# Patient Record
Sex: Male | Born: 1945
Health system: Southern US, Community
[De-identification: ages and names within clinical notes are randomized; demographics above are authoritative.]

## PROBLEM LIST (undated history)

## (undated) ENCOUNTER — Emergency Department (HOSPITAL_COMMUNITY): Payer: Medicare Other | Source: Home / Self Care

## (undated) DIAGNOSIS — Z950 Presence of cardiac pacemaker: Secondary | ICD-10-CM

## (undated) DIAGNOSIS — R9430 Abnormal result of cardiovascular function study, unspecified: Secondary | ICD-10-CM

## (undated) DIAGNOSIS — Z9981 Dependence on supplemental oxygen: Secondary | ICD-10-CM

## (undated) DIAGNOSIS — I2510 Atherosclerotic heart disease of native coronary artery without angina pectoris: Secondary | ICD-10-CM

## (undated) DIAGNOSIS — U071 COVID-19: Secondary | ICD-10-CM

## (undated) DIAGNOSIS — K859 Acute pancreatitis without necrosis or infection, unspecified: Secondary | ICD-10-CM

## (undated) DIAGNOSIS — I739 Peripheral vascular disease, unspecified: Secondary | ICD-10-CM

## (undated) DIAGNOSIS — R0600 Dyspnea, unspecified: Secondary | ICD-10-CM

## (undated) DIAGNOSIS — I714 Abdominal aortic aneurysm, without rupture: Secondary | ICD-10-CM

## (undated) DIAGNOSIS — K219 Gastro-esophageal reflux disease without esophagitis: Secondary | ICD-10-CM

## (undated) DIAGNOSIS — N189 Chronic kidney disease, unspecified: Secondary | ICD-10-CM

## (undated) DIAGNOSIS — I447 Left bundle-branch block, unspecified: Secondary | ICD-10-CM

## (undated) DIAGNOSIS — E785 Hyperlipidemia, unspecified: Secondary | ICD-10-CM

## (undated) DIAGNOSIS — Z95810 Presence of automatic (implantable) cardiac defibrillator: Secondary | ICD-10-CM

## (undated) DIAGNOSIS — I10 Essential (primary) hypertension: Secondary | ICD-10-CM

## (undated) DIAGNOSIS — Z79899 Other long term (current) drug therapy: Secondary | ICD-10-CM

## (undated) DIAGNOSIS — T783XXA Angioneurotic edema, initial encounter: Secondary | ICD-10-CM

## (undated) DIAGNOSIS — F419 Anxiety disorder, unspecified: Secondary | ICD-10-CM

## (undated) DIAGNOSIS — I219 Acute myocardial infarction, unspecified: Secondary | ICD-10-CM

## (undated) DIAGNOSIS — I429 Cardiomyopathy, unspecified: Secondary | ICD-10-CM

## (undated) DIAGNOSIS — W009XXA Unspecified fall due to ice and snow, initial encounter: Secondary | ICD-10-CM

## (undated) DIAGNOSIS — J84112 Idiopathic pulmonary fibrosis: Secondary | ICD-10-CM

## (undated) DIAGNOSIS — K429 Umbilical hernia without obstruction or gangrene: Secondary | ICD-10-CM

## (undated) DIAGNOSIS — I4891 Unspecified atrial fibrillation: Secondary | ICD-10-CM

## (undated) HISTORY — DX: Acute pancreatitis without necrosis or infection, unspecified: K85.90

## (undated) HISTORY — DX: Unspecified fall due to ice and snow, initial encounter: W00.9XXA

## (undated) HISTORY — DX: Peripheral vascular disease, unspecified: I73.9

## (undated) HISTORY — PX: POLYPECTOMY: SHX149

## (undated) HISTORY — DX: Presence of cardiac pacemaker: Z95.0

## (undated) HISTORY — DX: Abdominal aortic aneurysm, without rupture: I71.4

## (undated) HISTORY — DX: Unspecified atrial fibrillation: I48.91

## (undated) HISTORY — DX: Acute myocardial infarction, unspecified: I21.9

## (undated) HISTORY — DX: Presence of automatic (implantable) cardiac defibrillator: Z95.810

## (undated) HISTORY — PX: CARDIAC CATHETERIZATION: SHX172

## (undated) HISTORY — DX: Cardiomyopathy, unspecified: I42.9

## (undated) HISTORY — PX: ABDOMINAL AORTIC ANEURYSM REPAIR: SUR1152

## (undated) HISTORY — PX: COLONOSCOPY: SHX174

## (undated) HISTORY — DX: Hyperlipidemia, unspecified: E78.5

## (undated) HISTORY — DX: Angioneurotic edema, initial encounter: T78.3XXA

## (undated) HISTORY — PX: TONSILLECTOMY: SUR1361

## (undated) HISTORY — DX: Other long term (current) drug therapy: Z79.899

## (undated) HISTORY — DX: Essential (primary) hypertension: I10

## (undated) HISTORY — DX: Abnormal result of cardiovascular function study, unspecified: R94.30

## (undated) HISTORY — DX: Atherosclerotic heart disease of native coronary artery without angina pectoris: I25.10

## (undated) HISTORY — DX: Left bundle-branch block, unspecified: I44.7

---

## 2007-11-30 ENCOUNTER — Encounter: Payer: Self-pay | Admitting: Family Medicine

## 2007-12-11 ENCOUNTER — Ambulatory Visit: Payer: Self-pay | Admitting: *Deleted

## 2007-12-31 ENCOUNTER — Encounter: Payer: Self-pay | Admitting: Family Medicine

## 2007-12-31 ENCOUNTER — Ambulatory Visit: Payer: Self-pay | Admitting: *Deleted

## 2008-03-24 ENCOUNTER — Ambulatory Visit: Payer: Self-pay | Admitting: *Deleted

## 2008-06-23 ENCOUNTER — Ambulatory Visit: Payer: Self-pay | Admitting: *Deleted

## 2008-07-04 ENCOUNTER — Encounter (INDEPENDENT_AMBULATORY_CARE_PROVIDER_SITE_OTHER): Payer: Self-pay | Admitting: *Deleted

## 2008-07-04 LAB — CONVERTED CEMR LAB: Hgb A1c MFr Bld: 7.6 %

## 2008-09-15 ENCOUNTER — Ambulatory Visit: Payer: Self-pay | Admitting: *Deleted

## 2008-10-07 ENCOUNTER — Encounter (INDEPENDENT_AMBULATORY_CARE_PROVIDER_SITE_OTHER): Payer: Self-pay | Admitting: *Deleted

## 2008-10-07 LAB — CONVERTED CEMR LAB
Creatinine, Ser: 23 mg/dL
Microalb, Ur: 1.7 mg/dL

## 2009-03-16 ENCOUNTER — Ambulatory Visit: Payer: Self-pay | Admitting: Vascular Surgery

## 2009-03-23 ENCOUNTER — Encounter: Payer: Self-pay | Admitting: Family Medicine

## 2009-03-23 ENCOUNTER — Ambulatory Visit: Payer: Self-pay | Admitting: Vascular Surgery

## 2009-05-06 DIAGNOSIS — I714 Abdominal aortic aneurysm, without rupture, unspecified: Secondary | ICD-10-CM | POA: Insufficient documentation

## 2009-05-06 HISTORY — DX: Abdominal aortic aneurysm, without rupture: I71.4

## 2009-05-06 HISTORY — DX: Abdominal aortic aneurysm, without rupture, unspecified: I71.40

## 2009-07-18 ENCOUNTER — Encounter (INDEPENDENT_AMBULATORY_CARE_PROVIDER_SITE_OTHER): Payer: Self-pay | Admitting: *Deleted

## 2009-07-18 LAB — CONVERTED CEMR LAB
Testosterone, Free: 6 ng/dL
Testosterone, total: 292 ng/mL

## 2009-07-31 ENCOUNTER — Encounter (INDEPENDENT_AMBULATORY_CARE_PROVIDER_SITE_OTHER): Payer: Self-pay | Admitting: *Deleted

## 2009-07-31 LAB — CONVERTED CEMR LAB: PSA: 0.78 ng/mL

## 2009-08-21 ENCOUNTER — Encounter: Payer: Self-pay | Admitting: Family Medicine

## 2009-08-21 LAB — HM COLONOSCOPY

## 2009-09-21 ENCOUNTER — Ambulatory Visit: Payer: Self-pay | Admitting: Vascular Surgery

## 2009-09-21 ENCOUNTER — Encounter: Payer: Self-pay | Admitting: Family Medicine

## 2009-11-16 ENCOUNTER — Ambulatory Visit: Payer: Self-pay | Admitting: Family Medicine

## 2009-11-16 DIAGNOSIS — E1169 Type 2 diabetes mellitus with other specified complication: Secondary | ICD-10-CM | POA: Insufficient documentation

## 2009-11-16 DIAGNOSIS — E1159 Type 2 diabetes mellitus with other circulatory complications: Secondary | ICD-10-CM | POA: Insufficient documentation

## 2009-11-20 ENCOUNTER — Encounter (INDEPENDENT_AMBULATORY_CARE_PROVIDER_SITE_OTHER): Payer: Self-pay | Admitting: *Deleted

## 2009-11-21 ENCOUNTER — Ambulatory Visit: Payer: Self-pay | Admitting: Family Medicine

## 2009-11-24 LAB — CONVERTED CEMR LAB
ALT: 16 units/L (ref 0–53)
AST: 14 units/L (ref 0–37)
Albumin: 4.1 g/dL (ref 3.5–5.2)
Alkaline Phosphatase: 51 units/L (ref 39–117)
BUN: 29 mg/dL — ABNORMAL HIGH (ref 6–23)
CO2: 29 meq/L (ref 19–32)
Calcium: 9.4 mg/dL (ref 8.4–10.5)
Chloride: 107 meq/L (ref 96–112)
Cholesterol: 128 mg/dL (ref 0–200)
Creatinine, Ser: 0.8 mg/dL (ref 0.4–1.5)
Direct LDL: 62.5 mg/dL
GFR calc non Af Amer: 102.26 mL/min (ref >60)
Glucose, Bld: 164 mg/dL — ABNORMAL HIGH (ref 70–99)
HDL: 33.9 mg/dL — ABNORMAL LOW (ref >39.00)
Hgb A1c MFr Bld: 7.5 % — ABNORMAL HIGH (ref 4.6–6.5)
Potassium: 5 meq/L (ref 3.5–5.1)
Sodium: 139 meq/L (ref 135–145)
Total Bilirubin: 0.5 mg/dL (ref 0.3–1.2)
Total CHOL/HDL Ratio: 4
Total Protein: 6.9 g/dL (ref 6.0–8.3)
Triglycerides: 219 mg/dL — ABNORMAL HIGH (ref 0.0–149.0)
VLDL: 43.8 mg/dL — ABNORMAL HIGH (ref 0.0–40.0)

## 2009-12-13 ENCOUNTER — Encounter (INDEPENDENT_AMBULATORY_CARE_PROVIDER_SITE_OTHER): Payer: Self-pay | Admitting: *Deleted

## 2010-02-23 ENCOUNTER — Ambulatory Visit: Payer: Self-pay | Admitting: Family Medicine

## 2010-02-23 ENCOUNTER — Encounter (INDEPENDENT_AMBULATORY_CARE_PROVIDER_SITE_OTHER): Payer: Self-pay | Admitting: *Deleted

## 2010-02-23 LAB — CONVERTED CEMR LAB: Hgb A1c MFr Bld: 7.1 % — ABNORMAL HIGH (ref 4.6–6.5)

## 2010-02-27 ENCOUNTER — Ambulatory Visit: Payer: Self-pay | Admitting: Family Medicine

## 2010-02-27 DIAGNOSIS — I10 Essential (primary) hypertension: Secondary | ICD-10-CM | POA: Insufficient documentation

## 2010-06-05 NOTE — Letter (Signed)
Summary: [facility]  [facility]   Imported By: Lanelle Bal 12/15/2009 08:24:55  _____________________________________________________________________  External Attachment:    Type:   Image     Comment:   External Document

## 2010-06-05 NOTE — Assessment & Plan Note (Signed)
Summary: Luke [name] FROM [facility]/DLO   Vital Signs:  Patient profile:   65 year old male Height:      75 inches Weight:      161.25 pounds BMI:     20.23 Temp:     98.1 degrees F oral Pulse rate:   80 / minute Pulse rhythm:   regular BP sitting:   156 / 90  (left arm) Cuff size:   large  Vitals Entered By: Delilah Shan CMA Braidan Ricciardi Dull) (November 16, 2009 3:47 PM) CC: Transfer from [facility]   History of Present Illness: Diabetes:  Using medications without difficulties:yes Hypoglycemic episodes:no Hyperglycemic episodes:no Feet problems:no eye exam within last year: appointment at end of July.  Low T. Off meds currently.  Requesting records today.   Colonoscopy done and was normal except for diverticulosis.   Elevated Cholesterol: Using medications without problems:yes Muscle aches: no Other complaints: no  Preventive Screening-Counseling & Management  Alcohol-Tobacco     Smoking Status: quit  Caffeine-Diet-Exercise     Does Patient Exercise: yes      Drug Use:  no.    Current Medications (verified): 1)  Acetaminophen 325 Mg  Tabs (Acetaminophen) .... Take 1 Tablet By Mouth Once A Day 2)  Simvastatin 20 Mg Tabs (Simvastatin) .... ? Dosage 3)  Lisinopril 5 Mg Tabs (Lisinopril) .... ? Dosage 4)  Metformin Hcl 500 Mg Tabs (Metformin Hcl) .... 2 By Mouth in The Am, 1.5 By Mouth At Bedtime  Past History:  Past Medical History: DM2 PAD AAA per vasc surgery, observation as of 2011 HLD HTN- white coat HTN- often elevated in office and controlled on outside checks H/o low testosterone  Social History: Married former Smoker Retired Drug use-no Regular exercise-yes Smoking Status:  quit Drug Use:  no Does Patient Exercise:  yes  Physical Exam  General:  GEN: nad, alert and oriented HEENT: mucous membranes moist NECK: supple w/o LA CV: rrr.  no murmur PULM: ctab, no inc wob ABD: soft, +bs, bruit over lower mid abdomen  EXT: no edema SKIN: no acute rash   Diabetes  Management Exam:    Foot Exam (with socks and/or shoes not present):       Sensory-Pinprick/Light touch:          Left medial foot (L-4): normal          Left dorsal foot (L-5): normal          Left lateral foot (S-1): normal          Right medial foot (L-4): normal          Right dorsal foot (L-5): normal          Right lateral foot (S-1): normal       Sensory-Monofilament:          Left foot: normal          Right foot: normal       Inspection:          Left foot: normal          Right foot: normal       Nails:          Left foot: thickened          Right foot: thickened   Impression & Recommendations:  Problem # 1:  DIABETES-TYPE 2 (ICD-250.00) Continue current meds. Patient to call back with lisinopril dose and simvastatin dose.  Will rx as that point.  Hard copy written for CMET/lipid/A1c.  We'll contact pt with lab  report.  Plan for follow up in 3 months to discuss DM2 and low T.  requesting records.  The following medications were removed from the medication list:    Metformin Hcl 1000 Mg Tabs (Metformin hcl) .Marland Kitchen... Take 1 tablet by mouth every morning    Metformin Hcl 750 Mg Xr24h-tab (Metformin hcl) .Marland Kitchen... Take 1 tab by mouth at bedtime His updated medication list for this problem includes:    Lisinopril 5 Mg Tabs (Lisinopril) ..... ? dosage    Metformin Hcl 500 Mg Tabs (Metformin hcl) .Marland Kitchen... 2 by mouth in the am, 1.5 by mouth at bedtime  Complete Medication List: 1)  Acetaminophen 325 Mg Tabs (Acetaminophen) .... Take 1 tablet by mouth once a day 2)  Simvastatin 20 Mg Tabs (Simvastatin) .... ? dosage 3)  Lisinopril 5 Mg Tabs (Lisinopril) .... ? dosage 4)  Metformin Hcl 500 Mg Tabs (Metformin hcl) .... 2 by mouth in the am, 1.5 by mouth at bedtime   Patient Instructions: 1)  Please schedule a follow-up appointment in 3 months.  You can get your labs drawn at a [facility] site in [place].  We'll contact you with your lab report.  Call us back with your lisinopril and  simvastatin dose.   Prescriptions: METFORMIN HCL 500 MG TABS (METFORMIN HCL) 2 by mouth in the AM, 1.5 by mouth at bedtime  #360 x 3   Entered and Authorized by:   Crawford Givens MD   Signed by:   Crawford Givens MD on 11/16/2009   Method used:   Electronically to        [facility] [phone]* (retail)       [address] [place], [place]  [address]       Ph: [phone] or [phone]       Fax: [phone]   RxID:   [id number]   Current Allergies (reviewed today): No known allergies    Prevention & Chronic Care Immunizations   Influenza vaccine: Not documented    Tetanus booster: Not documented    Pneumococcal vaccine: Not documented    H. zoster vaccine: Not documented  Colorectal Screening   Hemoccult: Not documented    Colonoscopy: Not documented  Other Screening   PSA: Not documented   Smoking status: quit  (11/16/2009)  Diabetes Mellitus   HgbA1C: Not documented    Eye exam: Not documented    Foot exam: yes  (11/16/2009)   High risk foot: Not documented   Foot care education: Not documented    Urine microalbumin/creatinine ratio: Not documented  Lipids   Total Cholesterol: Not documented   LDL: Not documented   LDL Direct: Not documented   HDL: Not documented   Triglycerides: Not documented  Self-Management Support :    Diabetes self-management support: Not documented

## 2010-06-05 NOTE — Letter (Signed)
Summary: Luke [name] @ [facility] @ [employer]   Imported By: Lanelle Bal 12/15/2009 08:32:34  _____________________________________________________________________  External Attachment:    Type:   Image     Comment:   External Document

## 2010-06-05 NOTE — Procedures (Signed)
Summary: Colonoscopy/[facility]  Colonoscopy/[facility]   Imported By: Lanelle Bal 12/15/2009 08:24:00  _____________________________________________________________________  External Attachment:    Type:   Image     Comment:   External Document

## 2010-06-05 NOTE — Miscellaneous (Signed)
  Clinical Lists Changes  Medications: Added new medication of MULTIVITAMINS   TABS (MULTIPLE VITAMIN) Take 1 tablet by mouth once a day Added new medication of SIMVASTATIN 40 MG TABS (SIMVASTATIN) Take 1 tab by mouth at bedtime Removed medication of SIMVASTATIN 20 MG TABS (SIMVASTATIN) ? dosage Added new medication of LISINOPRIL 20 MG TABS (LISINOPRIL) Take 1 tablet by mouth every morning and 1/2 tablet by mouth in the evening. Removed medication of LISINOPRIL 5 MG TABS (LISINOPRIL) ? dosage

## 2010-06-05 NOTE — Miscellaneous (Signed)
  Clinical Lists Changes  Observations: Added new observation of COLONNXTDUE: 09/2012 (12/13/2009 10:30) Added new observation of COLONOSCOPY: Done (08/21/2009 10:46) Added new observation of PSA: 0.78 ng/mL (07/31/2009 10:30) Added new observation of TESTOS FREE: 6.0 ng/dL (66/44/0347 42:59) Added new observation of TESTOSTERTOT: 292 ng/mL (07/18/2009 10:30) Added new observation of MICROABL/CRE: 73.91 mg/mg (creat) (10/07/2008 10:30) Added new observation of MICROALB TST: 1.7 mg/dL (56/38/7564 33:29) Added new observation of CREATININE: 23 mg/dL (51/88/4166 06:30) Added new observation of HGBA1C: 7.6 % (07/04/2008 10:30)      Preventive Care Screening  Colonoscopy:    Date:  08/21/2009    Next Due:  09/2012    Results:  Done  PSA:    Date:  07/31/2009    Results:  0.78 ng/mL     Colonoscopy with fair prep.  Needs repeat in 3 years with two full days of prep preceded by two full days of clear liquids.

## 2010-06-05 NOTE — Assessment & Plan Note (Signed)
Summary: 3 M F/U DLO   Vital Signs:  Patient profile:   65 year old male Height:      75 inches Weight:      162.50 pounds BMI:     20.38 Temp:     97.6 degrees F oral Pulse rate:   76 / minute Pulse rhythm:   regular BP sitting:   178 / 82  (left arm) Cuff size:   regular  Vitals Entered By: Delilah Shan CMA Duncan Dull) (February 27, 2010 8:14 AM) CC: 3 months follow up   History of Present Illness: Diabetes: exercising daily, walking Using medications without difficulties: yes Hypoglycemic episodes: no Hyperglycemic episodes: no Feet problems: no Blood Sugars averaging: AM 120 early, 170 is checked later in AM, similar to prev.  110 before lunch and supper eye exam within last year: follow up 03/05/10 other: continues to have some breakthrough pain in calf bilaterally with exercise.  H/o PAD.  Resolves after 9 minutes.  No pain with biking.    Hypertension:      Using medication without problems or lightheadedness: yes Chest pain with exertion:no Edema:no Short of breath:no Average home BPs: 120s/80s (elevated per usual at the clinic) Other issues: cough for last few months, dry cough.  patient doesn't want to change meds at this point.  I offered to change.  no h/o heartburn.  not worse after eating.    Current Medications (verified): 1)  Acetaminophen 325 Mg  Tabs (Acetaminophen) .... Take 1 Tablet By Mouth Once A Day 2)  Metformin Hcl 500 Mg Tabs (Metformin Hcl) .... 2 By Mouth in The Am, 2 By Mouth At Bedtime 3)  Multivitamins   Tabs (Multiple Vitamin) .... Take 1 Tablet By Mouth Once A Day 4)  Simvastatin 40 Mg Tabs (Simvastatin) .... Take 1 Tab By Mouth At Bedtime 5)  Lisinopril 20 Mg Tabs (Lisinopril) .... Take 1 Tablet By Mouth Every Morning and 1/2 Tablet By Mouth in The Evening. 6)  Accu-Chek Aviva  Strp (Glucose Blood) .... Check Blood Sugar One Time A Day  Allergies (verified): 1)  ! Codeine  Past History:  Past Medical History: Last updated:  11/16/2009 DM2 PAD AAA per vasc surgery, observation as of 2011 HLD HTN- white coat HTN- often elevated in office and controlled on outside checks H/o low testosterone  Review of Systems       See HPI.  Otherwise negative.    Physical Exam  General:  GEN: nad, alert and oriented HEENT: mucous membranes moist NECK: supple w/o LA CV: rrr.  no murmur PULM: ctab, no inc wob ABD: soft, +bs, faint bruit over lower mid abdomen  EXT: no edema SKIN: no acute rash   Diabetes Management Exam:    Foot Exam (with socks and/or shoes not present):       Sensory-Pinprick/Light touch:          Left medial foot (L-4): normal          Left dorsal foot (L-5): normal          Left lateral foot (S-1): normal          Right medial foot (L-4): normal          Right dorsal foot (L-5): normal          Right lateral foot (S-1): normal       Sensory-Monofilament:          Left foot: normal          Right foot:  normal       Inspection:          Left foot: normal          Right foot: normal       Nails:          Left foot: normal          Right foot: normal   Impression & Recommendations:  Problem # 1:  DIABETES-TYPE 2 (ICD-250.00) No change in meds.  Labs reviwed with patient.  Cont diet/exercise.  His updated medication list for this problem includes:    Metformin Hcl 500 Mg Tabs (Metformin hcl) .Marland Kitchen... 2 by mouth in the am, 2 by mouth at bedtime    Lisinopril 20 Mg Tabs (Lisinopril) .Marland Kitchen... Take 1 tablet by mouth every morning and 1/2 tablet by mouth in the evening.  Problem # 2:  ESSENTIAL HYPERTENSION, BENIGN (ICD-401.1) Call back if increase in cough.   No change in meds in the meantime.   His updated medication list for this problem includes:    Lisinopril 20 Mg Tabs (Lisinopril) .Marland Kitchen... Take 1 tablet by mouth every morning and 1/2 tablet by mouth in the evening.  Complete Medication List: 1)  Acetaminophen 325 Mg Tabs (Acetaminophen) .... Take 1 tablet by mouth once a day 2)  Metformin  Hcl 500 Mg Tabs (Metformin hcl) .... 2 by mouth in the am, 2 by mouth at bedtime 3)  Multivitamins Tabs (Multiple vitamin) .... Take 1 tablet by mouth once a day 4)  Simvastatin 40 Mg Tabs (Simvastatin) .... Take 1 tab by mouth at bedtime 5)  Lisinopril 20 Mg Tabs (Lisinopril) .... Take 1 tablet by mouth every morning and 1/2 tablet by mouth in the evening. 6)  Accu-chek Aviva Strp (Glucose blood) .... Check blood sugar one time a day  Patient Instructions: 1)  Come back in 4 months for a fasting lab visit. 2)  cmet/lipid/A1c   250.00.  3)  OV a few days after that.  appointment.  4)  Let me know if the cough is getting worse.  This may be due to the lisinopril.    Orders Added: 1)  Est. Patient Level III [56213]    Current Allergies (reviewed today): ! CODEINE

## 2010-06-05 NOTE — Miscellaneous (Signed)
Summary: med list update- test strips  Clinical Lists Changes  Medications: Added new medication of ACCU-CHEK AVIVA  STRP (GLUCOSE BLOOD) check blood sugar one time a day     Prior Medications: ACETAMINOPHEN 325 MG  TABS (ACETAMINOPHEN) Take 1 tablet by mouth once a day METFORMIN HCL 500 MG TABS (METFORMIN HCL) 2 by mouth in the AM, 1.5 by mouth at bedtime MULTIVITAMINS   TABS (MULTIPLE VITAMIN) Take 1 tablet by mouth once a day SIMVASTATIN 40 MG TABS (SIMVASTATIN) Take 1 tab by mouth at bedtime LISINOPRIL 20 MG TABS (LISINOPRIL) Take 1 tablet by mouth every morning and 1/2 tablet by mouth in the evening. ACCU-CHEK AVIVA  STRP (GLUCOSE BLOOD) check blood sugar one time a day Current Allergies: No known allergies

## 2010-06-05 NOTE — Letter (Signed)
Summary: [facility]  [facility]   Imported By: Lanelle Bal 12/15/2009 08:30:58  _____________________________________________________________________  External Attachment:    Type:   Image     Comment:   External Document

## 2010-07-02 ENCOUNTER — Encounter (INDEPENDENT_AMBULATORY_CARE_PROVIDER_SITE_OTHER): Payer: Self-pay | Admitting: *Deleted

## 2010-07-02 ENCOUNTER — Other Ambulatory Visit (INDEPENDENT_AMBULATORY_CARE_PROVIDER_SITE_OTHER): Payer: BC Managed Care – PPO

## 2010-07-02 ENCOUNTER — Other Ambulatory Visit: Payer: Self-pay | Admitting: Family Medicine

## 2010-07-02 DIAGNOSIS — I10 Essential (primary) hypertension: Secondary | ICD-10-CM

## 2010-07-02 DIAGNOSIS — E119 Type 2 diabetes mellitus without complications: Secondary | ICD-10-CM

## 2010-07-02 DIAGNOSIS — E109 Type 1 diabetes mellitus without complications: Secondary | ICD-10-CM

## 2010-07-02 LAB — HEPATIC FUNCTION PANEL
ALT: 18 U/L (ref 0–53)
AST: 17 U/L (ref 0–37)
Albumin: 4.1 g/dL (ref 3.5–5.2)
Alkaline Phosphatase: 49 U/L (ref 39–117)
Bilirubin, Direct: 0.1 mg/dL (ref 0.0–0.3)
Total Bilirubin: 0.5 mg/dL (ref 0.3–1.2)
Total Protein: 6.7 g/dL (ref 6.0–8.3)

## 2010-07-02 LAB — LIPID PANEL
Cholesterol: 114 mg/dL (ref 0–200)
HDL: 33.7 mg/dL — ABNORMAL LOW (ref >39.00)
LDL Cholesterol: 52 mg/dL (ref 0–99)
Total CHOL/HDL Ratio: 3
Triglycerides: 140 mg/dL (ref 0.0–149.0)
VLDL: 28 mg/dL (ref 0.0–40.0)

## 2010-07-02 LAB — BASIC METABOLIC PANEL
BUN: 26 mg/dL — ABNORMAL HIGH (ref 6–23)
CO2: 27 mEq/L (ref 19–32)
Calcium: 9.5 mg/dL (ref 8.4–10.5)
Chloride: 103 mEq/L (ref 96–112)
Creatinine, Ser: 1 mg/dL (ref 0.4–1.5)
GFR: 80.03 mL/min (ref >60.00)
Glucose, Bld: 165 mg/dL — ABNORMAL HIGH (ref 70–99)
Potassium: 5.2 mEq/L — ABNORMAL HIGH (ref 3.5–5.1)
Sodium: 137 mEq/L (ref 135–145)

## 2010-07-02 LAB — HEMOGLOBIN A1C: Hgb A1c MFr Bld: 7.6 % — ABNORMAL HIGH (ref 4.6–6.5)

## 2010-07-04 ENCOUNTER — Encounter: Payer: Self-pay | Admitting: Family Medicine

## 2010-07-05 ENCOUNTER — Other Ambulatory Visit: Payer: Self-pay | Admitting: Family Medicine

## 2010-07-05 ENCOUNTER — Encounter: Payer: Self-pay | Admitting: Family Medicine

## 2010-07-05 ENCOUNTER — Ambulatory Visit (INDEPENDENT_AMBULATORY_CARE_PROVIDER_SITE_OTHER): Payer: BC Managed Care – PPO | Admitting: Family Medicine

## 2010-07-05 DIAGNOSIS — E875 Hyperkalemia: Secondary | ICD-10-CM | POA: Insufficient documentation

## 2010-07-05 DIAGNOSIS — N529 Male erectile dysfunction, unspecified: Secondary | ICD-10-CM | POA: Insufficient documentation

## 2010-07-05 DIAGNOSIS — I10 Essential (primary) hypertension: Secondary | ICD-10-CM

## 2010-07-05 DIAGNOSIS — Z23 Encounter for immunization: Secondary | ICD-10-CM

## 2010-07-05 DIAGNOSIS — E119 Type 2 diabetes mellitus without complications: Secondary | ICD-10-CM

## 2010-07-05 LAB — POTASSIUM: Potassium: 4.9 mEq/L (ref 3.5–5.1)

## 2010-07-05 LAB — HM DIABETES FOOT EXAM

## 2010-07-12 NOTE — Assessment & Plan Note (Signed)
Summary: 4 MTH F/U AFTER LABS/LFW   Vital Signs:  Patient profile:   65 year old [name] Height:      75 inches Weight:      168.25 pounds BMI:     21.11 Temp:     97.9 degrees F oral Pulse rate:   88 / minute Pulse rhythm:   regular BP sitting:   138 / 72  (left arm) Cuff size:   regular  Vitals Entered By: Delilah Shan CMA (AAMA) (July 05, 2010 9:30 AM) CC: 4 months follow up after labs   History of Present Illness: Diabetes:  Using medications without difficulties:yes Hypoglycemic episodes:no Hyperglycemic episodes:no Feet problems:no Blood Sugars averaging: 160 in AM if checked after 6AM, lower before that.   ~100 in the afternoon, usually 120s-140s before bed, occ higher.  eye exam within last year: yesterday, normal exam per patient- no retinopathy per patient, no vision change exercising more and hunger/food intake has increased.    K was minmially elevated and needs recheck.  see labs.   Hypertension:      Using medication without problems or lightheadedness: yes Chest pain with exertion:no Edema:no Short of breath:no Average home BPs:120s/80s, pulse  ~80 at home.  He usually gets nervous with BP check in the office.   ED- d/w patient.  No CP, short of breath.  Asking about viagra use.   Routine instructions given to patient.  No contraindications for this medicine for the patient.    Allergies: 1)  ! Codeine  Past History:  Past Medical History: Last updated: 11/16/2009 DM2 PAD AAA per vasc surgery, observation as of 2011 HLD HTN- white coat HTN- often elevated in office and controlled on outside checks H/o low testosterone  Social History: Last updated: 11/16/2009 Married former Smoker Retired Drug use-no Regular exercise-yes  Review of Systems       See HPI.  Otherwise negative.    Physical Exam  General:  GEN: nad, alert and oriented HEENT: mucous membranes moist NECK: supple w/o LA CV: rrr.  no murmur PULM: ctab, no inc wob ABD:  soft, +bs, i didn't appreciate bruit over lower mid abdomen  EXT: no edema SKIN: no acute rash  1+ dp pulses  Diabetes Management Exam:    Foot Exam (with socks and/or shoes not present):       Sensory-Pinprick/Light touch:          Left medial foot (L-4): normal          Left dorsal foot (L-5): normal          Left lateral foot (S-1): normal          Right medial foot (L-4): normal          Right dorsal foot (L-5): normal          Right lateral foot (S-1): normal       Sensory-Monofilament:          Left foot: normal          Right foot: normal       Inspection:          Left foot: normal          Right foot: normal       Nails:          Left foot: fungal infection          Right foot: fungal infection   Impression & Recommendations:  Problem # 1:  DIABETES-TYPE 2 (ICD-250.00) No change in  meds. Pt to work on diet and keep exercising.  follow up in 3 months . he agrees.  labs d/w patient .  His updated medication list for this problem includes:    Metformin Hcl 500 Mg Tabs (Metformin hcl) .Marland Kitchen... 2 by mouth in the am, 2 by mouth at bedtime    Lisinopril 20 Mg Tabs (Lisinopril) .Marland Kitchen... Take 1 tablet by mouth every morning and 1/2 tablet by mouth in the evening.  Problem # 2:  ESSENTIAL HYPERTENSION, BENIGN (ICD-401.1) Controlled out of clinic.  No change in meds today.  His updated medication list for this problem includes:    Lisinopril 20 Mg Tabs (Lisinopril) .Marland Kitchen... Take 1 tablet by mouth every morning and 1/2 tablet by mouth in the evening.  Problem # 3:  HYPERKALEMIA (ICD-276.7) see lab.  Orders: Venipuncture (81191) Specimen Handling (47829) TLB-Potassium (K+) (84132-K)  Problem # 4:  ORGANIC IMPOTENCE (ICD-607.84) try viagra with routine instructions, understood.  follow up as needed.  His updated medication list for this problem includes:    Viagra 50 Mg Tabs (Sildenafil citrate) .Marland Kitchen... 1 by mouth once daily as needed  Complete Medication List: 1)  Acetaminophen  325 Mg Tabs (Acetaminophen) .... Take 1 tablet by mouth once a day 2)  Metformin Hcl 500 Mg Tabs (Metformin hcl) .... 2 by mouth in the am, 2 by mouth at bedtime 3)  Multivitamins Tabs (Multiple vitamin) .... Take 1 tablet by mouth once a day 4)  Simvastatin 40 Mg Tabs (Simvastatin) .... Take 1 tab by mouth at bedtime 5)  Lisinopril 20 Mg Tabs (Lisinopril) .... Take 1 tablet by mouth every morning and 1/2 tablet by mouth in the evening. 6)  Accu-chek Aviva Strp (Glucose blood) .... Check blood sugar one time a day 7)  Viagra 50 Mg Tabs (Sildenafil citrate) .Marland Kitchen.. 1 by mouth once daily as needed  Other Orders: TD Toxoids IM 7 YR + (56213) Admin 1st Vaccine (08657)  Patient Instructions: 1)  Check with your insurance to see if they will cover the shingles shot.   2)  We'll contact you with your lab report.  3)  recheck A1c in 3 months with OV a few days later.   4)  Keep exercising and working on your diet.   5)  Let me know if you have any trouble with viagra.   Prescriptions: VIAGRA 50 MG TABS (SILDENAFIL CITRATE) 1 by mouth once daily as needed  #10 x 5   Entered and Authorized by:   Crawford Givens MD   Signed by:   Crawford Givens MD on 07/05/2010   Method used:   Electronically to        [facility] [address] (retail)       [id number]       [place], [place]  [address]       Ph: [phone]       Fax: [phone]   RxID:   [id number] LISINOPRIL 20 MG TABS (LISINOPRIL) Take 1 tablet by mouth every morning and 1/2 tablet by mouth in the evening.  #135 x 3   Entered and Authorized by:   Crawford Givens MD   Signed by:   Crawford Givens MD on 07/05/2010   Method used:   Electronically to        [facility] [phone]* (retail)       [address] [place],  [place]  [address]       Ph: [phone] or [phone]       Fax: [phone]   RxID:   [id number] SIMVASTATIN 40 MG TABS (SIMVASTATIN) Take 1 tab by mouth at bedtime  #90 x 3    Entered and Authorized by:   Crawford Givens MD   Signed by:   Crawford Givens MD on 07/05/2010   Method used:   Electronically to        [facility] [phone]* (retail)       [address] [place], [place]  [address]       Ph: [phone] or [phone]       Fax: [phone]   RxID:   [id number] METFORMIN HCL 500 MG TABS (METFORMIN HCL) 2 by mouth in the AM, 2 by mouth at bedtime  #360 x 3   Entered and Authorized by:   Crawford Givens MD   Signed by:   Crawford Givens MD on 07/05/2010   Method used:   Electronically to        [facility] [address] [phone]* (retail)       [address] [place], [place]  [address]       Ph: [phone] or [phone]       Fax: [phone]   RxID:   [id number]    Orders Added: 1)  Est. Patient Level IV [81017] 2)  Venipuncture [51025] 3)  Specimen Handling [99000] 4)  TD Toxoids IM 7 YR + [90714] 5)  Admin 1st Vaccine [90471] 6)  TLB-Potassium (K+) [85277-O]   Immunization History:  Influenza Immunization History:    Influenza:  historical (02/27/2010)  Immunizations Administered:  Tetanus Vaccine:    Vaccine Type: Td    Site: left deltoid    Mfr: Sanofi Pasteur    Dose: 0.5 ml    Route: IM    Given by: Delilah Shan CMA (AAMA)    Exp. Date: 08/23/2011    Lot #: [id number]    VIS given: 03/23/08 version given July 05, 2010.   Immunization History:  Influenza Immunization History:    Influenza:  Historical (02/27/2010)  Immunizations Administered:  Tetanus Vaccine:    Vaccine Type: Td    Site: left deltoid    Mfr: Sanofi Pasteur    Dose: 0.5 ml    Route: IM    Given by: Delilah Shan CMA (AAMA)    Exp. Date: 08/23/2011    Lot #: [id number]    VIS given: 03/23/08 version given July 05, 2010.  Current Allergies (reviewed today): ! CODEINE

## 2010-07-24 NOTE — Letter (Signed)
Summary: Diabetes Eye exam/ [facility]  Diabetes Eye exam/ [facility]   Imported By: Kassie Mends 07/16/2010 09:43:28  _____________________________________________________________________  External Attachment:    Type:   Image     Comment:   External Document  Appended Document: Diabetes Eye exam/ [facility]    Clinical Lists Changes  Observations: Added new observation of DMEYEEXAMNXT: 07/2011 (07/16/2010 22:24)       Procedures Next Due Date:    Diabetic Eye Exam: 07/2011

## 2010-09-18 NOTE — Procedures (Signed)
DUPLEX ULTRASOUND OF ABDOMINAL AORTA   INDICATION:  Followup abdominal aortic aneurysm.   HISTORY:  Diabetes:  Yes.  Cardiac:  No.  Hypertension:  Yes.  Smoking:  Previous.  Connective Tissue Disorder:  Family History:  Mother died of a cerebral aneurysm.  Previous Surgery:  No.   DUPLEX EXAM:         AP (cm)                   TRANSVERSE (cm)  Proximal             2.9 cm                    2.8 cm  Mid                  2.3 cm                    2.3 cm  Distal               4.8 cm                    4.9 cm  Right Iliac          0.8 cm                    1.0 cm  Left Iliac           1.0 cm                    1.0 cm   PREVIOUS:  Date:  03/24/2008  AP:  4.9  TRANSVERSE:  5.1   IMPRESSION:  Stable aneurysm measurements of the distal abdominal aorta  noted when compared to the previous exam.   ___________________________________________  P. Liliane Bade, M.D.   CH/MEDQ  D:  06/23/2008  T:  06/23/2008  Job:  [id number]

## 2010-09-18 NOTE — Assessment & Plan Note (Signed)
OFFICE VISIT   Luke [name], Luke [name]  DOB:  02/16/1946                                       03/23/2009  [id number]#:[id number]   I saw the patient in the office today for continued followup of his  abdominal aortic aneurysm.  This is a pleasant 65 year old gentleman who  Dr. Madilyn Fireman had been following with an aneurysm.  His most recent followup  abdominal ultrasound prior to today was in May of this year when the  aneurysm measured 5.1 cm in maximum diameter.  The patient also has  known peripheral vascular disease.  Since he was seen in May, he has had  no new abdominal or back pain.  He has stable claudication in both lower  extremities.  He states he experiences pain in the calves associated  with ambulation and relieved with rest.  The symptoms are slightly more  significant on the right side.  There are no other aggravating or  alleviating factors.  He has had no history of rest pain and no history  of nonhealing ulcers.  He does have neuropathy in his feet.   PAST MEDICAL HISTORY:  Significant for diabetes which is stable on his  current medications.  In addition, he has hypertension and  hypercholesterolemia.  He denies any history of previous myocardial  infarction, history of congestive heart failure or history of COPD.   FAMILY HISTORY:  His mother had an intracranial aneurysm.  He is unaware  of any history of abdominal aortic aneurysms in the family.  He is  unaware of any history of premature cardiovascular disease.   SOCIAL HISTORY:  He is married and has 1 child.  He quit tobacco in  2002.   MEDICATIONS:  Are documented on the medical history form in his chart.   ALLERGIES:  He is allergic to codeine.   REVIEW OF SYSTEMS:  GENERAL:  He has lost some weight over the last 6  months.  He is 158 pounds currently, 6 feet 2 inches tall.  CARDIAC:  He has had no chest pain, chest pressure, palpitations or  arrhythmias.  VASCULAR:  He has claudication  in his legs.  He has had no history of  DVT or phlebitis.  He has had no history of stroke.  Cardiac, pulmonary, GI, GU, neurologic, musculoskeletal, psychiatric,  ENT, hematologic review of systems is unremarkable and is documented on  the medical history form in his chart.   PHYSICAL EXAMINATION:  This is a pleasant 65 year old gentleman who  appears his stated age.  Blood pressure is 153/84, heart rate is 90,  temperature is 97.9.  HEENT:  There is no facial asymmetry.  Extraocular  motions are intact.  Conjunctivae are normal.  Neck:  Supple.  There is  no jugular venous distention.  No cervical lymphadenopathy.  Lungs:  Clear bilaterally to auscultation without rales, rhonchi or wheezing.  Cardiovascular:  I do not detect any carotid bruits.  He has a regular  rate and rhythm without murmur appreciated.  He has no significant  peripheral edema.  He has palpable femoral pulses.  I cannot palpate  popliteal or pedal pulses on either side.  Abdomen:  Soft and nontender.  His aneurysm is palpable and nontender.  Musculoskeletal:  There are no  major deformities or cyanosis.  Neurologic:  Nonfocal without weakness  or paresthesias.  Skin:  He has no evidence of atheroembolic disease.   I have independently interpreted his ultrasound of his aneurysm which  shows the maximum diameter is 5.19 cm and has thus not changed  significantly over the last 6 months.  He has no iliac artery aneurysmal  disease.  I compared this study to his previous study.  I have also  reviewed his previous ABIs which were 45% and 51% on the left.   With respect to his claudication, his symptoms are stable.  I have  simply encouraged him to stay as active as possible.  He understands we  would not consider elective repair of his aneurysm unless it reached 5.5  cm in maximum diameter.  I have ordered a followup ultrasound in 6  months, and I will see him back at that time.  He knows to call sooner  if he has  problems.   Di Kindle. Edilia Bo, M.D.  Electronically Signed   CSD/MEDQ  D:  03/23/2009  T:  03/24/2009  Job:  [id number]   cc:   Dwana Curd. Para March, M.D.

## 2010-09-18 NOTE — Procedures (Signed)
DUPLEX ULTRASOUND OF ABDOMINAL AORTA   INDICATION:  Followup of known abdominal aortic aneurysm.   HISTORY:  Diabetes:  Yes.  Cardiac:  No.  Hypertension:  Yes.  Smoking:  No.  Connective Tissue Disorder:  Family History:  Previous Surgery:   DUPLEX EXAM:         AP (cm)                   TRANSVERSE (cm)  Proximal             2.16 cm                   2.28 cm  Mid                  5.02 cm                   5.05 cm  Distal               2.23 cm                   2.39 cm  Right Iliac          1.0 cm                    1.0 cm  Left Iliac           1.09 cm                   0.97 cm   PREVIOUS:  Date:  11/25/2007  AP:  5.0  TRANSVERSE:  5.03   IMPRESSION:  1. Essentially stable AAA with maximum diameter measuring 5.02 cm (AP)      x 5.0 cm as compared to previous study done by [facility].  2. Moderate - severe mural thrombus noted in AAA which is not flow      restricting at this time.  3.  The patient has an appointment with Dr. Madilyn Fireman on 08/27 at 2:15      p.m.   ___________________________________________  P. Liliane Bade, M.D.   PB/MEDQ  D:  12/11/2007  T:  12/12/2007  Job:  [id number]

## 2010-09-18 NOTE — Procedures (Signed)
LOWER EXTREMITY ARTERIAL EVALUATION-SINGLE LEVEL   INDICATION:  Followup of known peripheral vascular disease.  The patient  complains of bilateral claudication at approximately 5-6 blocks, right >  left which worsens when walking on an incline where claudication is at  one block.  The patient denies rest pain.   HISTORY:  Diabetes:  Yes.  Cardiac:  No.  Hypertension:  Yes.  Smoking:  No.  Previous Surgery:   RESTING SYSTOLIC PRESSURES: (ABI)                          RIGHT                LEFT  Brachial:               154                  154  Anterior tibial:        74 (0.48)            Inaudible  Posterior tibial:       68                   68  Peroneal:                                    70 (0.45)  DOPPLER WAVEFORM ANALYSIS:  Anterior tibial:        Monophasic  Posterior tibial:       Monophasic           Mono/Biphasic  Peroneal:                                    Mono/Biphasic   PREVIOUS ABI'S:  Date:  11/25/2007  RIGHT:  0.37  LEFT:  0.43   IMPRESSION:  Essentially stable ABIs bilaterally which are consistent  with moderate-severe arterial compromise.   ___________________________________________  P. Liliane Bade, M.D.   PB/MEDQ  D:  12/11/2007  T:  12/12/2007  Job:  [id number]

## 2010-09-18 NOTE — Procedures (Signed)
DUPLEX ULTRASOUND OF ABDOMINAL AORTA   INDICATION:  Followup AAA   HISTORY:  Diabetes:  Yes  Cardiac:  No  Hypertension:  Yes  Smoking:  Previous smoker  Connective Tissue Disorder:  Family History:  Mother  Previous Surgery:  No   DUPLEX EXAM:         AP (cm)                   TRANSVERSE (cm)  Proximal             3.5  cm                   2.72 cm  Mid                  4.96 cm                   5.19 cm  Distal               3.49 cm                   3.37 cm  Right Iliac          Unable to visualize       Unable to visualize  Left Iliac           Unable to visualize       Unable to visualize   PREVIOUS:  Date:  AP:  5.1  TRANSVERSE:  5.1   IMPRESSION:  1. Abdominal aortic aneurysm with the largest diameter of 4.96 x 5.19      cm/sec.  Stable from previous exam.  2. Iliac bifurcation was not visualized due to excess bowel gas      patterns.   ___________________________________________  Di Kindle. Edilia Bo, M.D.   NT/MEDQ  D:  09/21/2009  T:  09/21/2009  Job:  [id number]

## 2010-09-18 NOTE — Procedures (Signed)
DUPLEX ULTRASOUND OF ABDOMINAL AORTA   INDICATION:  Follow up abdominal aortic aneurysm.   HISTORY:  Diabetes:  Yes.  Cardiac:  No.  Hypertension:  Yes.  Smoking:  Quit.  Connective Tissue Disorder:  Family History:  Previous Surgery:   DUPLEX EXAM:         AP (cm)                   TRANSVERSE (cm)  Proximal             1.89 cm                   2.28 cm  Mid                  5.10 cm                   5.19 cm  Distal               3.49 cm                   3.34 cm  Right Iliac          1.23 cm                   1.06 cm  Left Iliac           1.02 cm                   1.16 cm   PREVIOUS:  Date:  09/15/2008  AP:  4.9  TRANSVERSE:  4.1   IMPRESSION:  Abdominal aortic aneurysm noted with the largest  measurement of 5.10 cm X 5.19 cm.   ___________________________________________  Di Kindle. Edilia Bo, M.D.   MG/MEDQ  D:  03/16/2009  T:  03/16/2009  Job:  [id number]

## 2010-09-18 NOTE — Procedures (Signed)
DUPLEX ULTRASOUND OF ABDOMINAL AORTA   INDICATION:  Followup evaluation of abdominal aortic aneurysm.   HISTORY:  Diabetes:  Yes.  Cardiac:  No.  Hypertension:  Yes.  Smoking:  Former smoker.  Connective Tissue Disorder:  Family History:  The patient's mother died of a cerebral aneurysm.  Previous Surgery:   DUPLEX EXAM:         AP (cm)                   TRANSVERSE (cm)  Proximal             3.6 cm                    3.9 cm  Mid                  4.9 cm                    5.1 cm  Distal               2.6 cm                    3.3 cm  Right Iliac          0.7 cm                    1.1 cm  Left Iliac           0.8 cm                    0.7 cm   PREVIOUS:  Date:  12/11/2007  AP:  5.0  TRANSVERSE:  5.0   IMPRESSION:  Abdominal aortic aneurysm measurements are stable from  previous study.   ___________________________________________  P. Liliane Bade, M.D.   MC/MEDQ  D:  03/24/2008  T:  03/24/2008  Job:  [id number]

## 2010-09-18 NOTE — Procedures (Signed)
DUPLEX ULTRASOUND OF ABDOMINAL AORTA   INDICATION:  Abdominal aortic aneurysm.   HISTORY:  Diabetes:  Yes.  Cardiac:  No.  Hypertension:  Yes.  Smoking:  Previous.  Connective Tissue Disorder:  Family History:  Mother died of cerebral aneurysm.  Previous Surgery:  No.   DUPLEX EXAM:         AP (cm)                   TRANSVERSE (cm)  Proximal             2.9 cm                    2.9 cm  Mid                  Not visualized            Not visualized  Distal               4.9                       5.1  Right Iliac          0.7                       0.9  Left Iliac           0.9 cm                    0.9 cm   PREVIOUS:  Date: 06/23/2008  AP:  4.8  TRANSVERSE:  4.9   IMPRESSION:  1. Aneurysm of the distal abdominal aorta noted with no significant      change in the maximum diameter when compared to the previous exam.  2. Unable to adequately visualize the mid abdominal aorta due to      overlying bowel gas patterns.   ___________________________________________  P. Liliane Bade, M.D.   CH/MEDQ  D:  09/15/2008  T:  09/15/2008  Job:  [id number]

## 2010-09-18 NOTE — Consult Note (Signed)
VASCULAR SURGERY CONSULTATION   Luke [name], Luke [name]  DOB:  03/05/1946                                       12/31/2007  [id number]#:[id number]   REASON FOR REFERRAL:  1. Abdominal aortic aneurysm.  2. Peripheral vascular disease.   HISTORY:  The patient is a 65 year old male who admits to not having  seen a physician for approximately 40 years.  He went to a recent  [facility] screening ultrasound evaluation.  This revealed an abdominal  aortic aneurysm and evidence of peripheral vascular disease in lower  extremities.   He denies abdominal pain or back pain.  He has had some mild recent  weight loss.   He does note bilateral calf claudication when walking up an incline.  Develops discomfort in the calves bilaterally, relieved by rest.  No  night pain or rest pain.   Risk factors for vascular disease include hypertension, history of  tobacco use and diabetes.   PAST MEDICAL HISTORY:  1. Type 2 diabetes.  2. Hypertension.  3. Hyperlipidemia.   MEDICATIONS:  1. Lisinopril 20 mg daily.  2. Metformin 1000 mg b.i.d.  3. Simvastatin 40 mg daily.  4. Aspirin 325 mg daily.   SOCIAL HISTORY:  The patient is married with one grown child.  His son  is 7 years old and in good health.  He is retired from [employer].  Works part time for [employer].  Does not smoke.  Discontinued tobacco use in 2005, having smoked up to 1 pack of  cigarettes per day for several years.  No regular alcohol use.   FAMILY HISTORY:  Mother died at age 65 of a brain aneurysm.  Father died  at age 53 of lung cancer.  He has 1 sister living with type 2 diabetes.   REVIEW OF SYSTEMS:  Refer to Patient Encounter Form.  Patient does note  weight loss.  Also, pain in his legs with ambulation.  Other questions  negative per Patient Encounter Form.   PHYSICAL EXAMINATION:  General:  A well-appearing 65 year old male.  Alert and oriented.  No distress.  Vital Signs:  BP 177/94,  pulse 93 per  minute, respirations 18 per minute.  HEENT:  Mouth and throat are clear.  Normocephalic.  Extraocular movements intact.  Neck:  Supple, no  thyromegaly or adenopathy.  Chest:  Equal air entry bilaterally.  No  rales or rhonchi.  Cardiovascular:  No carotid bruits.  Normal heart  sounds without murmurs.  Regular rate and rhythm.  No gallops or rubs.  Abdomen:  Soft, nontender.  AAA palpable.  No organomegaly or other  masses.  Normal bowel sounds without bruits.  Extremities:  Full range  of motion.  Femoral pulses 2+ bilaterally.  No ankle edema.  No  popliteal, posterior tibial or dorsalis pedis pulses bilaterally.  Neurologic:  Cranial nerves intact.  Strengths equal bilaterally.  Reflexes 1+.  Skin:  No rash or ulceration.   INVESTIGATIONS:  Abdominal ultrasound reveals 5.05 cm abdominal aortic  aneurysm.   Lower extremity arterial Dopplers reveal monophasic tibial waveforms  bilaterally with ankle brachial indices measuring 0.48 and 4.5 in the  right and left legs respectively.   IMPRESSION:  1. A 5 cm abdominal aortic aneurysm.  2. Bilateral lower extremity peripheral vascular occlusive disease      with claudication.  3. Hypertension.  4. Hyperlipidemia.  5. Type 2 diabetes.   RECOMMENDATION:  Will plan follow-up in 3 months with repeat ultrasound  to evaluate for aneurysm growth.   Follow up with claudication symptoms at that time for re-evaluation.   Balinda Quails, M.D.  Electronically Signed  PGH/MEDQ  D:  12/31/2007  T:  01/01/2008  Job:  [id number]   cc:   Dwana Curd. Para March, M.D.

## 2010-10-02 ENCOUNTER — Other Ambulatory Visit: Payer: Self-pay | Admitting: Family Medicine

## 2010-10-03 ENCOUNTER — Other Ambulatory Visit (INDEPENDENT_AMBULATORY_CARE_PROVIDER_SITE_OTHER): Payer: BC Managed Care – PPO | Admitting: Family Medicine

## 2010-10-03 DIAGNOSIS — E119 Type 2 diabetes mellitus without complications: Secondary | ICD-10-CM

## 2010-10-03 LAB — HEMOGLOBIN A1C: Hgb A1c MFr Bld: 7.2 % — ABNORMAL HIGH (ref 4.6–6.5)

## 2010-10-04 ENCOUNTER — Encounter: Payer: Self-pay | Admitting: Family Medicine

## 2010-10-08 ENCOUNTER — Encounter: Payer: Self-pay | Admitting: Family Medicine

## 2010-10-08 ENCOUNTER — Ambulatory Visit (INDEPENDENT_AMBULATORY_CARE_PROVIDER_SITE_OTHER): Payer: BC Managed Care – PPO | Admitting: Family Medicine

## 2010-10-08 DIAGNOSIS — E119 Type 2 diabetes mellitus without complications: Secondary | ICD-10-CM

## 2010-10-08 DIAGNOSIS — I714 Abdominal aortic aneurysm, without rupture, unspecified: Secondary | ICD-10-CM

## 2010-10-08 DIAGNOSIS — I739 Peripheral vascular disease, unspecified: Secondary | ICD-10-CM

## 2010-10-08 DIAGNOSIS — I10 Essential (primary) hypertension: Secondary | ICD-10-CM

## 2010-10-08 NOTE — Assessment & Plan Note (Addendum)
Controlled on outside checks.  Labs reviewed. D/w pt EA:VWUJ and exercise.

## 2010-10-08 NOTE — Progress Notes (Signed)
Diabetes:  Using medications without difficulties:yes Hypoglycemic episodes:no Hyperglycemic episodes:no Feet problems: occ tingling in feet, no change Blood Sugars averaging: ~100-214 at night, usually <130 at night, variable based on diet.  "It's been better the last 3 months, I've worked on my diet." eye exam within last year: yes Exercising, leg cramps improved.    Hypertension:    Using medication without problems or lightheadedness: yes Chest pain with exertion:no Edema:no Short of breath:no Average home BPs: 123/79 this AM- always higher in clinc  Has fu with vasc about AAA coming up.  He prev had some B breakthrough calf claudication symptoms, but this is improved with continued exercise.   Labs d/w pt.   PMH and SH reviewed.   Vital signs, Meds and allergies reviewed.  ROS: See HPI.  Otherwise nontributory.   GEN: nad, alert and oriented HEENT: mucous membranes moist NECK: supple w/o LA CV: rrr PULM: ctab, no inc wob ABD: soft, +bs, umbilical hernia noted, faint bruit heard EXT: no edema SKIN: no acute rash  Diabetic foot exam: Normal inspection No skin breakdown but dry skin noted.  No calluses  1+ DP pulses Normal sensation to light tough and monofilament Nails thickened Mild tingling in feet per patient

## 2010-10-08 NOTE — Patient Instructions (Signed)
Schedule a follow up appointment in: 3 months.  I would get labs ahead of time.  You don't have to fast for that.   Take care.  Glad to see you.  Happy birthday.

## 2010-10-08 NOTE — Assessment & Plan Note (Signed)
Symptoms improved with inc in exercise.  Continue risk factor modification

## 2010-10-08 NOTE — Assessment & Plan Note (Signed)
No change in meds. Recheck A1c in 3 months.

## 2011-01-16 ENCOUNTER — Other Ambulatory Visit (INDEPENDENT_AMBULATORY_CARE_PROVIDER_SITE_OTHER): Payer: BC Managed Care – PPO

## 2011-01-16 DIAGNOSIS — E119 Type 2 diabetes mellitus without complications: Secondary | ICD-10-CM

## 2011-01-16 LAB — HEMOGLOBIN A1C: Hgb A1c MFr Bld: 7.2 % — ABNORMAL HIGH (ref 4.6–6.5)

## 2011-01-21 ENCOUNTER — Ambulatory Visit: Payer: BC Managed Care – PPO | Admitting: Family Medicine

## 2011-01-28 ENCOUNTER — Ambulatory Visit: Payer: BC Managed Care – PPO | Admitting: Family Medicine

## 2011-01-30 ENCOUNTER — Ambulatory Visit: Payer: BC Managed Care – PPO | Admitting: Family Medicine

## 2011-01-31 ENCOUNTER — Encounter: Payer: Self-pay | Admitting: Family Medicine

## 2011-01-31 ENCOUNTER — Ambulatory Visit (INDEPENDENT_AMBULATORY_CARE_PROVIDER_SITE_OTHER): Payer: BC Managed Care – PPO | Admitting: Family Medicine

## 2011-01-31 DIAGNOSIS — E1159 Type 2 diabetes mellitus with other circulatory complications: Secondary | ICD-10-CM

## 2011-01-31 DIAGNOSIS — I10 Essential (primary) hypertension: Secondary | ICD-10-CM

## 2011-01-31 DIAGNOSIS — I798 Other disorders of arteries, arterioles and capillaries in diseases classified elsewhere: Secondary | ICD-10-CM

## 2011-01-31 DIAGNOSIS — I714 Abdominal aortic aneurysm, without rupture: Secondary | ICD-10-CM

## 2011-01-31 DIAGNOSIS — Z23 Encounter for immunization: Secondary | ICD-10-CM

## 2011-01-31 DIAGNOSIS — I739 Peripheral vascular disease, unspecified: Secondary | ICD-10-CM

## 2011-01-31 NOTE — Assessment & Plan Note (Signed)
Stable, continue exercise.

## 2011-01-31 NOTE — Patient Instructions (Signed)
Schedule a physical in March of 2013 with labs ahead of time.   Check with your insurance to see if they will cover the shingles shot. Keep exercising.  Take care.

## 2011-01-31 NOTE — Progress Notes (Signed)
Diabetes:  Using medications without difficulties:yes Hypoglycemic episodes:no Hyperglycemic episodes:no Feet problems:no Blood Sugars averaging: taken at night, usually 120s.  H/o dawn effect in AM eye exam within last year: yes Labs reviewed with patient.    Umbilical hernia.  No change in size.  No sx.    Vasc disease.  H/o PAD but improved with exercise.  No wounds on legs.  Has AAA fu with vascular in 11/12.  No sx.  No pulsatile mass noted by pt.     Hypertension:    Using medication without problems or lightheadedness: yes Chest pain with exertion:no Edema:no Short of breath:no Average home BPs:120s systolic on outside checks.  Other issues:exercising several times a week.   PMH and SH reviewed  Meds, vitals, and allergies reviewed.   ROS: See HPI.  Otherwise negative.    GEN: nad, alert and oriented HEENT: mucous membranes moist NECK: supple w/o LA CV: rrr. PULM: ctab, no inc wob ABD: soft, +bs, no bruit appreciated, umbilical hernia noted, soft and reduced easily.   EXT: no edema SKIN: no acute rash  Diabetic foot exam: Normal inspection No skin breakdown but dry skin noted No calluses  1+ DP pulses B Normal sensation to light touch and monofilament but tingling noted in the feet (this is not worse than prev per patient) Nails thickened.  No hair on the feet

## 2011-01-31 NOTE — Assessment & Plan Note (Signed)
Controlled on outside checks, no change in meds.  

## 2011-01-31 NOTE — Assessment & Plan Note (Signed)
Continue meds, diet, exercise and f/u for labs in 3/13.  D/w pt about foot care.

## 2011-01-31 NOTE — Assessment & Plan Note (Signed)
Has f/u with vasc 11/12

## 2011-03-04 ENCOUNTER — Other Ambulatory Visit: Payer: Self-pay | Admitting: Family Medicine

## 2011-03-07 ENCOUNTER — Other Ambulatory Visit: Payer: Self-pay

## 2011-03-07 DIAGNOSIS — I70219 Atherosclerosis of native arteries of extremities with intermittent claudication, unspecified extremity: Secondary | ICD-10-CM

## 2011-03-07 DIAGNOSIS — I714 Abdominal aortic aneurysm, without rupture: Secondary | ICD-10-CM

## 2011-03-08 ENCOUNTER — Other Ambulatory Visit: Payer: Self-pay

## 2011-03-08 ENCOUNTER — Other Ambulatory Visit (INDEPENDENT_AMBULATORY_CARE_PROVIDER_SITE_OTHER): Payer: BC Managed Care – PPO | Admitting: *Deleted

## 2011-03-08 DIAGNOSIS — I714 Abdominal aortic aneurysm, without rupture: Secondary | ICD-10-CM

## 2011-03-21 NOTE — Procedures (Unsigned)
DUPLEX ULTRASOUND OF ABDOMINAL AORTA  INDICATION:  Followup AAA  HISTORY: Diabetes:  Yes Cardiac:  No Hypertension:  Yes Smoking:  Previous Connective Tissue Disorder: Family History:  Mother Previous Surgery:  DUPLEX EXAM:         AP (cm)                   TRANSVERSE (cm) Proximal             NV                        NV Mid                  5.4 cm                    5.2 cm Distal               2.3 cm                    NV Right Iliac          1.01 cm Left Iliac           0.82 cm  PREVIOUS:  Date:  09/21/2009  AP:  4.96  TRANSVERSE:  5.19  IMPRESSION: 1. Abdominal aortic aneurysm measuring approximately 5.4 cm x 5.2 cm     which is a slight increase since previous exam. 2. Technically difficult study due to bowel gas.  ___________________________________________ Di Kindle. Edilia Bo, M.D.  LT/MEDQ  D:  03/08/2011  T:  03/08/2011  Job:  [id number]

## 2011-03-22 ENCOUNTER — Other Ambulatory Visit: Payer: Self-pay | Admitting: Vascular Surgery

## 2011-03-22 ENCOUNTER — Encounter: Payer: Self-pay | Admitting: Vascular Surgery

## 2011-03-22 DIAGNOSIS — I714 Abdominal aortic aneurysm, without rupture: Secondary | ICD-10-CM

## 2011-07-22 ENCOUNTER — Other Ambulatory Visit: Payer: Self-pay | Admitting: Family Medicine

## 2011-07-24 ENCOUNTER — Other Ambulatory Visit: Payer: Self-pay | Admitting: Family Medicine

## 2011-07-29 ENCOUNTER — Other Ambulatory Visit: Payer: Self-pay | Admitting: Family Medicine

## 2011-07-29 DIAGNOSIS — E119 Type 2 diabetes mellitus without complications: Secondary | ICD-10-CM

## 2011-07-30 ENCOUNTER — Other Ambulatory Visit (INDEPENDENT_AMBULATORY_CARE_PROVIDER_SITE_OTHER): Payer: BC Managed Care – PPO

## 2011-07-30 DIAGNOSIS — E119 Type 2 diabetes mellitus without complications: Secondary | ICD-10-CM

## 2011-07-30 LAB — COMPREHENSIVE METABOLIC PANEL
ALT: 19 U/L (ref 0–53)
AST: 20 U/L (ref 0–37)
Albumin: 4.1 g/dL (ref 3.5–5.2)
Alkaline Phosphatase: 51 U/L (ref 39–117)
BUN: 34 mg/dL — ABNORMAL HIGH (ref 6–23)
CO2: 26 mEq/L (ref 19–32)
Calcium: 9.6 mg/dL (ref 8.4–10.5)
Chloride: 101 mEq/L (ref 96–112)
Creatinine, Ser: 1.2 mg/dL (ref 0.4–1.5)
GFR: 64.62 mL/min (ref >60.00)
Glucose, Bld: 193 mg/dL — ABNORMAL HIGH (ref 70–99)
Potassium: 5.5 mEq/L — ABNORMAL HIGH (ref 3.5–5.1)
Sodium: 135 mEq/L (ref 135–145)
Total Bilirubin: 0.6 mg/dL (ref 0.3–1.2)
Total Protein: 7.4 g/dL (ref 6.0–8.3)

## 2011-07-30 LAB — LIPID PANEL
Cholesterol: 157 mg/dL (ref 0–200)
HDL: 42.9 mg/dL (ref >39.00)
Total CHOL/HDL Ratio: 4
Triglycerides: 221 mg/dL — ABNORMAL HIGH (ref 0.0–149.0)
VLDL: 44.2 mg/dL — ABNORMAL HIGH (ref 0.0–40.0)

## 2011-07-30 LAB — HEMOGLOBIN A1C: Hgb A1c MFr Bld: 7.3 % — ABNORMAL HIGH (ref 4.6–6.5)

## 2011-07-30 LAB — LDL CHOLESTEROL, DIRECT: Direct LDL: 85.5 mg/dL

## 2011-08-01 ENCOUNTER — Encounter: Payer: Self-pay | Admitting: Family Medicine

## 2011-08-01 ENCOUNTER — Ambulatory Visit (INDEPENDENT_AMBULATORY_CARE_PROVIDER_SITE_OTHER): Payer: BC Managed Care – PPO | Admitting: Family Medicine

## 2011-08-01 VITALS — BP 142/84 | HR 86 | Temp 98.0°F | Wt 177.0 lb

## 2011-08-01 DIAGNOSIS — E875 Hyperkalemia: Secondary | ICD-10-CM

## 2011-08-01 DIAGNOSIS — I739 Peripheral vascular disease, unspecified: Secondary | ICD-10-CM

## 2011-08-01 DIAGNOSIS — I798 Other disorders of arteries, arterioles and capillaries in diseases classified elsewhere: Secondary | ICD-10-CM

## 2011-08-01 DIAGNOSIS — Z125 Encounter for screening for malignant neoplasm of prostate: Secondary | ICD-10-CM

## 2011-08-01 DIAGNOSIS — I714 Abdominal aortic aneurysm, without rupture, unspecified: Secondary | ICD-10-CM

## 2011-08-01 DIAGNOSIS — E119 Type 2 diabetes mellitus without complications: Secondary | ICD-10-CM

## 2011-08-01 DIAGNOSIS — E1159 Type 2 diabetes mellitus with other circulatory complications: Secondary | ICD-10-CM

## 2011-08-01 LAB — POTASSIUM: Potassium: 4.7 mEq/L (ref 3.5–5.1)

## 2011-08-01 NOTE — Patient Instructions (Addendum)
Recheck labs in 3 months with a medicare physical a few days after the lab draw.  Keep working on your sugar.   See what vascular says in May and let me know if you have questions. Take care.  Keep exercising. Glad to see you.

## 2011-08-01 NOTE — Progress Notes (Signed)
K was elevated on labs.  Due for recheck.  See notes on labs.   BP has been controlled, has f/u with vascular in May.    Diabetes:  Using medications without difficulties:yes Hypoglycemic episodes:no Hyperglycemic episodes:no Feet problems:  Blood Sugars averaging: 160 in AM, much lower later in the day and 2 hours after eating eye exam within last year: f/u is pending Exercising at gym A1c 7.3 Less leg pain from PAD with inc in exercise. Exercise tolerance is much improved.    Meds, vitals, and allergies reviewed.   ROS: See HPI.  Otherwise negative.    GEN: nad, alert and oriented HEENT: mucous membranes moist NECK: supple w/o LA CV: rrr. PULM: ctab, no inc wob ABD: soft, +bs EXT: no edema SKIN: no acute rash  Diabetic foot exam: Normal inspection No skin breakdown Calluses  1+ DP pulses Normal sensation to light touch and monofilament Nails normal

## 2011-08-02 ENCOUNTER — Encounter: Payer: Self-pay | Admitting: Family Medicine

## 2011-08-02 NOTE — Assessment & Plan Note (Signed)
Per vascular surgery.  BP okay outside of clinic.

## 2011-08-02 NOTE — Assessment & Plan Note (Signed)
Symptomatically improved.  Continue exercise.

## 2011-08-02 NOTE — Assessment & Plan Note (Signed)
Continue current meds.  Labs, diet, exercise discussed, recheck in 2013.

## 2011-08-18 ENCOUNTER — Other Ambulatory Visit: Payer: Self-pay | Admitting: Family Medicine

## 2011-09-24 ENCOUNTER — Encounter: Payer: Self-pay | Admitting: Vascular Surgery

## 2011-09-25 ENCOUNTER — Encounter (INDEPENDENT_AMBULATORY_CARE_PROVIDER_SITE_OTHER): Payer: BC Managed Care – PPO | Admitting: *Deleted

## 2011-09-25 ENCOUNTER — Other Ambulatory Visit: Payer: Self-pay | Admitting: Vascular Surgery

## 2011-09-25 ENCOUNTER — Ambulatory Visit (INDEPENDENT_AMBULATORY_CARE_PROVIDER_SITE_OTHER): Payer: BC Managed Care – PPO | Admitting: Vascular Surgery

## 2011-09-25 ENCOUNTER — Encounter: Payer: Self-pay | Admitting: Vascular Surgery

## 2011-09-25 ENCOUNTER — Other Ambulatory Visit: Payer: Self-pay

## 2011-09-25 VITALS — BP 193/99 | HR 95 | Temp 98.0°F | Ht 75.0 in | Wt 173.0 lb

## 2011-09-25 DIAGNOSIS — Z01818 Encounter for other preprocedural examination: Secondary | ICD-10-CM

## 2011-09-25 DIAGNOSIS — I714 Abdominal aortic aneurysm, without rupture: Secondary | ICD-10-CM

## 2011-09-25 DIAGNOSIS — I739 Peripheral vascular disease, unspecified: Secondary | ICD-10-CM

## 2011-09-25 LAB — CREATININE, SERUM: Creat: 1.06 mg/dL (ref 0.50–1.35)

## 2011-09-25 LAB — BUN: BUN: 25 mg/dL — ABNORMAL HIGH (ref 6–23)

## 2011-09-25 NOTE — Assessment & Plan Note (Signed)
His claudication symptoms remained stable. He is scheduled for arteriography which will allow [place] to further assess his infrainguinal arterial occlusive disease. Fortunately he is not a smoker. He states his blood pressure has been well controlled on his current medications. He states that his cholesterol is followed by his primary care physician.

## 2011-09-25 NOTE — Progress Notes (Signed)
Vascular and Vein Specialist of [facility]  Patient name: Luke [name] MRN: [id number] DOB: 13-Sep-1945 Sex: male  REASON FOR VISIT: follow up of abdominal aortic aneurysm  HPI: Luke [name] is a 66 y.o. male who I been following with an abdominal aortic aneurysm. When he was seen in our office 6 months ago for a follow duplex was 5.4 cm in maximum diameter. He was set up for a 6 month follow up visit. I had last seen in November of 2010. He does have a history of stable claudication of both lower extremities with bilateral infrainguinal arterial occlusive disease. Since he was seen last she's had no history of abdominal pain or back pain. He has a history of hypertension which she states has been well controlled although he does note that whenever he is at the doctor's office it is increased significantly.  Past Medical History  Diagnosis Date  . Diabetes mellitus     type II  . PAD (peripheral artery disease)   . AAA (abdominal aortic aneurysm) 2011    Per vascular surgery  . HLD (hyperlipidemia)   . Hypertension     white coat HTN-- often elevated in office and controlled on outside checks.  . Low testosterone     Hx of    Family History  Problem Relation Age of Onset  . Hypertension Mother   . Diabetes Sister   . Heart disease Sister   . Hypertension Sister     SOCIAL HISTORY: History  Substance Use Topics  . Smoking status: Former Smoker    Types: Cigarettes    Quit date: 05/06/2004  . Smokeless tobacco: Never Used   Comment: quit about 7 years ago  . Alcohol Use: 1.2 oz/week    2 Cans of beer per week     beer occassionally    Allergies  Allergen Reactions  . Codeine     Current Outpatient Prescriptions  Medication Sig Dispense Refill  . ACCU-CHEK AVIVA PLUS test strip CHECK BLOOD SUGAR three times a day 2 hours after meals  100 each  PRN  . aspirin 81 MG tablet Take 81 mg by mouth daily.        Marland Kitchen lisinopril (PRINIVIL,ZESTRIL) 20 MG tablet TAKE 1 TABLET  EVERY MORNING AND 1/2 TABLET IN THE EVENING.  135 tablet  3  . metFORMIN (GLUCOPHAGE) 500 MG tablet TAKE 2 TABLETS EACH MORNING AND 2 TABLETS AT BEDTIME.  360 tablet  1  . Multiple Vitamin (MULTIVITAMIN) tablet Take 1 tablet by mouth daily.        . sildenafil (VIAGRA) 50 MG tablet Take 50 mg by mouth daily as needed.        . simvastatin (ZOCOR) 40 MG tablet TAKE 1 TABLET AT BEDTIME.  90 tablet  3  . acetaminophen (TYLENOL) 325 MG tablet Take 325 mg by mouth daily.          REVIEW OF SYSTEMS: Arly.Keller ] denotes positive finding; [  ] denotes negative finding  CARDIOVASCULAR:  [ ]  chest pain   [ ]  chest pressure   [ ]  palpitations   [ ]  orthopnea   [ ]  dyspnea on exertion   Arly.Keller ] claudication- bilateral   [ ]  rest pain   [ ]  DVT   [ ]  phlebitis PULMONARY:   [ ]  productive cough   [ ]  asthma   [ ]  wheezing NEUROLOGIC:   [ ]  weakness  [ ]  paresthesias  [ ]  aphasia  [ ]   amaurosis  [ ]  dizziness HEMATOLOGIC:   [ ]  bleeding problems   [ ]  clotting disorders MUSCULOSKELETAL:  [ ]  joint pain   [ ]  joint swelling [ ]  leg swelling GASTROINTESTINAL: [ ]   blood in stool  [ ]   hematemesis GENITOURINARY:  [ ]   dysuria  [ ]   hematuria PSYCHIATRIC:  [ ]  history of major depression INTEGUMENTARY:  [ ]  rashes  [ ]  ulcers CONSTITUTIONAL:  [ ]  fever   [ ]  chills  PHYSICAL EXAM: Filed Vitals:   09/25/11 0954  BP: 193/99  Pulse: 95  Temp: 98 F (36.7 C)  TempSrc: Oral  Height: 6\' 3"  (1.905 m)  Weight: 173 lb (78.472 kg)   Body mass index is 21.62 kg/(m^2). GENERAL: The patient is a well-nourished male, in no acute distress. The vital signs are documented above. CARDIOVASCULAR: There is a regular rate and rhythm without significant murmur appreciated. I do not detect carotid bruits.he has palpable femoral pulses. I cannot palpate pedal pulses. Both feet appear adequately perfused. He has no significant lower extremity swelling. PULMONARY: There is good air exchange bilaterally without wheezing or  rales. ABDOMEN: Soft and non-tender with normal pitched bowel sounds. His aneurysm is palpable and nontender. He has a ventral hernia. MUSCULOSKELETAL: There are no major deformities or cyanosis. NEUROLOGIC: No focal weakness or paresthesias are detected. SKIN: There are no ulcers or rashes noted. PSYCHIATRIC: The patient has a normal affect.  DATA:  I have independently interpreted his duplex of his aneurysm today which shows that the maximum diameter of the aneurysm is 5.9 cm. This is increased from 5.4 cm in November of 2012. Of note because of overlying bowel gas it was not possible to visualize the iliac arteries.  MEDICAL ISSUES:  AAA (abdominal aortic aneurysm) His aneurysm has now enlarged to 5.9 cm. This reason I have recommended elective repair of the aneurysm. Without repair the risk of rupture is approximately 5-10% per year. In order to plan elective repair I have ordered a CT of the abdomen and pelvis and also we will proceed with arteriography. These studies will allow [place] to determine if he is a good candidate for endovascular aneurysm repair. We will also set him up for a Cardiolite at the [place] office. Once his workup is complete we can schedule elective repair of his aneurysm. I have reviewed with the patient the indications for arteriography. In addition, I have reviewed the potential complications of arteriography including but not limited to: Bleeding, arterial injury, arterial thrombosis, dye action, renal insufficiency, or other unpredictable medical problems.    PVD (peripheral vascular disease) His claudication symptoms remained stable. He is scheduled for arteriography which will allow [place] to further assess his infrainguinal arterial occlusive disease. Fortunately he is not a smoker. He states his blood pressure has been well controlled on his current medications. He states that his cholesterol is followed by his primary care physician.    Sharnette Kitamura  S [facility] [place] Beeper: [phone]

## 2011-09-25 NOTE — Assessment & Plan Note (Signed)
His aneurysm has now enlarged to 5.9 cm. This reason I have recommended elective repair of the aneurysm. Without repair the risk of rupture is approximately 5-10% per year. In order to plan elective repair I have ordered a CT of the abdomen and pelvis and also we will proceed with arteriography. These studies will allow [place] to determine if he is a good candidate for endovascular aneurysm repair. We will also set him up for a Cardiolite at the [place] office. Once his workup is complete we can schedule elective repair of his aneurysm. I have reviewed with the patient the indications for arteriography. In addition, I have reviewed the potential complications of arteriography including but not limited to: Bleeding, arterial injury, arterial thrombosis, dye action, renal insufficiency, or other unpredictable medical problems.

## 2011-10-02 ENCOUNTER — Ambulatory Visit (HOSPITAL_COMMUNITY): Payer: BC Managed Care – PPO

## 2011-10-02 ENCOUNTER — Other Ambulatory Visit: Payer: BC Managed Care – PPO

## 2011-10-02 NOTE — Procedures (Unsigned)
DUPLEX ULTRASOUND OF ABDOMINAL AORTA  INDICATION:  Abdominal aortic aneurysm.  HISTORY: Diabetes:  Yes. Cardiac:  No. Hypertension:  Yes. Smoking:  Previous. Connective Tissue Disorder: Family History:  Mother. Previous Surgery:  No.  DUPLEX EXAM:         AP (cm)                   TRANSVERSE (cm) Proximal             2.8 cm                    2.8 cm Mid                  Not visualized            Not visualized Distal               5.9 cm                    5.9 cm Right Iliac          Not visualized            Not visualized Left Iliac           Not visualized            Not visualized  PREVIOUS:  Date: 03/08/2011  AP:  5.4  TRANSVERSE:  5.2  IMPRESSION: 1. Aneurysmal dilatation of the distal abdominal aorta with an     increase in maximum diameter when compared to the previous exam. 2. Unable to adequately visualize the mid abdominal aorta and     bilateral common iliac arteries due to overlying bowel gas pattern.     The patient states he had coffee prior to this exam.  ___________________________________________ Di Kindle. Edilia Bo, M.D.  CH/MEDQ  D:  09/26/2011  T:  09/26/2011  Job:  [id number]

## 2011-10-04 ENCOUNTER — Other Ambulatory Visit: Payer: Self-pay | Admitting: *Deleted

## 2011-10-04 ENCOUNTER — Encounter (HOSPITAL_COMMUNITY): Payer: Self-pay

## 2011-10-04 DIAGNOSIS — I714 Abdominal aortic aneurysm, without rupture: Secondary | ICD-10-CM

## 2011-10-07 ENCOUNTER — Ambulatory Visit
Admission: RE | Admit: 2011-10-07 | Discharge: 2011-10-07 | Disposition: A | Discharge status: Home / Self Care | Payer: BC Managed Care – PPO | Source: Ambulatory Visit | Attending: Vascular Surgery | Admitting: Vascular Surgery

## 2011-10-07 ENCOUNTER — Inpatient Hospital Stay: Admission: RE | Admit: 2011-10-07 | Payer: BC Managed Care – PPO | Source: Ambulatory Visit

## 2011-10-07 DIAGNOSIS — I714 Abdominal aortic aneurysm, without rupture: Secondary | ICD-10-CM

## 2011-10-07 MED ORDER — IOHEXOL 300 MG/ML  SOLN
100.0000 mL | Freq: Once | INTRAMUSCULAR | Status: AC | PRN
  Administered 2011-10-07: 100 mL via INTRAVENOUS

## 2011-10-09 ENCOUNTER — Ambulatory Visit (HOSPITAL_COMMUNITY): Payer: Medicare Other | Attending: Cardiovascular Disease | Admitting: Radiology

## 2011-10-09 VITALS — BP 167/93 | Ht 75.0 in | Wt 166.0 lb

## 2011-10-09 DIAGNOSIS — E119 Type 2 diabetes mellitus without complications: Secondary | ICD-10-CM | POA: Insufficient documentation

## 2011-10-09 DIAGNOSIS — R9431 Abnormal electrocardiogram [ECG] [EKG]: Secondary | ICD-10-CM | POA: Diagnosis not present

## 2011-10-09 DIAGNOSIS — Z8249 Family history of ischemic heart disease and other diseases of the circulatory system: Secondary | ICD-10-CM | POA: Diagnosis not present

## 2011-10-09 DIAGNOSIS — E785 Hyperlipidemia, unspecified: Secondary | ICD-10-CM | POA: Insufficient documentation

## 2011-10-09 DIAGNOSIS — I714 Abdominal aortic aneurysm, without rupture, unspecified: Secondary | ICD-10-CM | POA: Insufficient documentation

## 2011-10-09 DIAGNOSIS — Z01810 Encounter for preprocedural cardiovascular examination: Secondary | ICD-10-CM | POA: Diagnosis not present

## 2011-10-09 DIAGNOSIS — I10 Essential (primary) hypertension: Secondary | ICD-10-CM | POA: Insufficient documentation

## 2011-10-09 DIAGNOSIS — Z01818 Encounter for other preprocedural examination: Secondary | ICD-10-CM

## 2011-10-09 DIAGNOSIS — I739 Peripheral vascular disease, unspecified: Secondary | ICD-10-CM | POA: Diagnosis not present

## 2011-10-09 DIAGNOSIS — I447 Left bundle-branch block, unspecified: Secondary | ICD-10-CM | POA: Diagnosis not present

## 2011-10-09 DIAGNOSIS — Z87891 Personal history of nicotine dependence: Secondary | ICD-10-CM | POA: Insufficient documentation

## 2011-10-09 MED ORDER — REGADENOSON 0.4 MG/5ML IV SOLN
0.4000 mg | Freq: Once | INTRAVENOUS | Status: AC
  Administered 2011-10-09: 0.4 mg via INTRAVENOUS

## 2011-10-09 MED ORDER — TECHNETIUM TC 99M TETROFOSMIN IV KIT
10.0000 | PACK | Freq: Once | INTRAVENOUS | Status: AC | PRN
  Administered 2011-10-09: 10 via INTRAVENOUS

## 2011-10-09 MED ORDER — TECHNETIUM TC 99M TETROFOSMIN IV KIT
30.0000 | PACK | Freq: Once | INTRAVENOUS | Status: AC | PRN
  Administered 2011-10-09: 30 via INTRAVENOUS

## 2011-10-09 NOTE — Progress Notes (Signed)
[facility] SITE 3 NUCLEAR MED [address] [place] [address] [phone]  Cardiology Nuclear Med Study  Luke [name] is a 66 y.o. male     MRN : [id number]     DOB: May 05, 1946  Procedure Date: 10/09/2011  Nuclear Med Background Indication for Stress Test:  Evaluation for Ischemia and Surgical Clearance Pre- op AAA Repair History:  AAA (5.9cm) Cardiac Risk Factors: Claudication, Family History - CAD, History of Smoking, Hypertension, Lipids, NIDDM and PVD  Symptoms:  n/a   Nuclear Pre-Procedure Caffeine/Decaff Intake:  None NPO After: 6:30 pm   Lungs:  clear O2 Sat: 98% on room air. IV 0.9% NS with Angio Cath:  20g  IV Site: R Antecubital  IV Started by:  Bonnita Levan, RN  Chest Size (in):  44 Cup Size: n/a  Height: 6\' 3"  (1.905 m)  Weight:  166 lb (75.297 kg)  BMI:  Body mass index is 20.75 kg/(m^2). Tech Comments:  N/A    Nuclear Med Study 1 or 2 day study: 1 day  Stress Test Type:  Lexiscan  Reading MD: Kristeen Miss, MD  Order Authorizing Provider:  August Albino MD  Resting Radionuclide: Technetium 65m Tetrofosmin  Resting Radionuclide Dose: 11.0 mCi   Stress Radionuclide:  Technetium 85m Tetrofosmin  Stress Radionuclide Dose: 32.8 mCi           Stress Protocol Rest HR: 79 Stress HR: 100  Rest BP: 167/93 Stress BP: 175/90  Exercise Time (min): n/a METS: n/a   Predicted Max HR: 156 bpm % Max HR: 64.1 bpm Rate Pressure Product: 21308   Dose of Adenosine (mg):  n/a Dose of Lexiscan: 0.4 mg  Dose of Atropine (mg): n/a Dose of Dobutamine: n/a mcg/kg/min (at max HR)  Stress Test Technologist: Milana Na, EMT-P  Nuclear Technologist:  Domenic Polite, CNMT     Rest Procedure:  Myocardial perfusion imaging was performed at rest 45 minutes following the intravenous administration of Technetium 5m Tetrofosmin. Rest ECG: NSR - Normal EKG and NSR-LBBB  Stress Procedure:  The patient received IV Lexiscan 0.4 mg over 15-seconds.   Technetium 32m Tetrofosmin injected at 30-seconds.  There were non Dx 2nd to LBBB, + sob, abdominal pain, feels weird with Lexiscan.  Quantitative spect images were obtained after a 45 minute delay. Stress ECG: No significant change from baseline ECG  QPS Raw Data Images:  Normal; no motion artifact; normal heart/lung ratio. Stress Images:  There is a large, moderately severe defect in the basal and mid anterior septum as well as the mid/distal inferior wall.  The uptake in the other regions is normal. Rest Images:  There is a large, moderately severe defect in the basal and mid anterior septum as well as the mid/distal inferior wall.  The uptake in the other regions is normal.  Subtraction (SDS):  There is evidence of a previous large anterior septal and inferior septal MI with a small amount of peri-infarct ischemia in the mid septal wall. Transient Ischemic Dilatation (Normal <1.22):  0.99 Lung/Heart Ratio (Normal <0.45):  0.26  Quantitative Gated Spect Images QGS EDV:  173 ml QGS ESV:  122 ml  Impression Exercise Capacity:  Lexiscan with no exercise. BP Response:  Normal blood pressure response. Clinical Symptoms:  No significant symptoms noted. ECG Impression:  There is a LBBB at baseline.   No significant ST segment change suggestive of ischemia. Comparison with Prior Nuclear Study: No images to compare  Overall Impression:  Abnormal stress nuclear study.  There is evidence of a previous large anterior septal MI with an inferior septal MI.  The LV is markedly dilated with global hypokinesis.  There is severe hypokinesis in the anterior septal and inferior septal walls. LV Ejection Fraction: 30%.      [name], [name]., MD, [facility] 10/09/2011, 5:17 PM Office - [phone] Pager [phone]

## 2011-10-11 ENCOUNTER — Ambulatory Visit (INDEPENDENT_AMBULATORY_CARE_PROVIDER_SITE_OTHER): Payer: Medicare Other | Admitting: Cardiology

## 2011-10-11 ENCOUNTER — Encounter: Payer: Self-pay | Admitting: Cardiology

## 2011-10-11 VITALS — BP 180/90 | HR 97 | Ht 75.0 in | Wt 172.0 lb

## 2011-10-11 DIAGNOSIS — I447 Left bundle-branch block, unspecified: Secondary | ICD-10-CM

## 2011-10-11 DIAGNOSIS — I10 Essential (primary) hypertension: Secondary | ICD-10-CM | POA: Diagnosis not present

## 2011-10-11 DIAGNOSIS — I255 Ischemic cardiomyopathy: Secondary | ICD-10-CM | POA: Insufficient documentation

## 2011-10-11 DIAGNOSIS — R0989 Other specified symptoms and signs involving the circulatory and respiratory systems: Secondary | ICD-10-CM

## 2011-10-11 DIAGNOSIS — I2510 Atherosclerotic heart disease of native coronary artery without angina pectoris: Secondary | ICD-10-CM | POA: Insufficient documentation

## 2011-10-11 DIAGNOSIS — Z01812 Encounter for preprocedural laboratory examination: Secondary | ICD-10-CM | POA: Diagnosis not present

## 2011-10-11 DIAGNOSIS — I429 Cardiomyopathy, unspecified: Secondary | ICD-10-CM

## 2011-10-11 DIAGNOSIS — R9430 Abnormal result of cardiovascular function study, unspecified: Secondary | ICD-10-CM | POA: Insufficient documentation

## 2011-10-11 DIAGNOSIS — I428 Other cardiomyopathies: Secondary | ICD-10-CM

## 2011-10-11 DIAGNOSIS — I714 Abdominal aortic aneurysm, without rupture: Secondary | ICD-10-CM | POA: Diagnosis not present

## 2011-10-11 LAB — BASIC METABOLIC PANEL
BUN: 23 mg/dL (ref 6–23)
CO2: 27 mEq/L (ref 19–32)
Calcium: 9.8 mg/dL (ref 8.4–10.5)
Chloride: 101 mEq/L (ref 96–112)
Creatinine, Ser: 1.1 mg/dL (ref 0.4–1.5)
GFR: 72.93 mL/min (ref >60.00)
Glucose, Bld: 267 mg/dL — ABNORMAL HIGH (ref 70–99)
Potassium: 4.7 mEq/L (ref 3.5–5.1)
Sodium: 136 mEq/L (ref 135–145)

## 2011-10-11 LAB — CBC WITH DIFFERENTIAL/PLATELET
Basophils Absolute: 0 10*3/uL (ref 0.0–0.1)
Basophils Relative: 0.5 % (ref 0.0–3.0)
Eosinophils Absolute: 0.1 10*3/uL (ref 0.0–0.7)
Eosinophils Relative: 0.7 % (ref 0.0–5.0)
HCT: 43.7 % (ref 39.0–52.0)
Hemoglobin: 14.3 g/dL (ref 13.0–17.0)
Lymphocytes Relative: 14.9 % (ref 12.0–46.0)
Lymphs Abs: 1.5 10*3/uL (ref 0.7–4.0)
MCHC: 32.8 g/dL (ref 30.0–36.0)
MCV: 98.5 fl (ref 78.0–100.0)
Monocytes Absolute: 0.5 10*3/uL (ref 0.1–1.0)
Monocytes Relative: 5.4 % (ref 3.0–12.0)
Neutro Abs: 7.9 10*3/uL — ABNORMAL HIGH (ref 1.4–7.7)
Neutrophils Relative %: 78.5 % — ABNORMAL HIGH (ref 43.0–77.0)
Platelets: 222 10*3/uL (ref 150.0–400.0)
RBC: 4.44 Mil/uL (ref 4.22–5.81)
RDW: 12.9 % (ref 11.5–14.6)
WBC: 10 10*3/uL (ref 4.5–10.5)

## 2011-10-11 LAB — PROTIME-INR
INR: 1 ratio (ref 0.8–1.0)
Prothrombin Time: 11.3 s (ref 10.2–12.4)

## 2011-10-11 MED ORDER — LISINOPRIL 20 MG PO TABS: 20.0000 mg | ORAL_TABLET | Freq: Two times a day (BID) | ORAL | Status: DC

## 2011-10-11 MED ORDER — CARVEDILOL 3.125 MG PO TABS: 3.1250 mg | ORAL_TABLET | Freq: Two times a day (BID) | ORAL | Status: DC

## 2011-10-11 NOTE — Patient Instructions (Signed)
Your physician has recommended you make the following change in your medication: Increase your lisinopril to 20mg  twice daily and start Carvedilol 3.125mg  twice daily  Your physician recommends that you return for lab work in: today (bmet, cbc, pt)  You have been scheduled for a cardiac cath on Tuesday 10/15/11.  Please see letter for instructions

## 2011-10-11 NOTE — Progress Notes (Signed)
HPI The patient is seen today for new cardiac evaluation. He has significant abdominal aortic aneurysm. This was being assessed and there were plans for an angiogram. Nuclear scan was arranged to help with cardiac screening. Patient also has diabetes in addition to his vascular disease. There is no history of heart disease. However he is not been evaluated for cardiac problems in the past. There is no recent EKG. He does exercise. He has no chest pain or significant shortness of breath. His nuclear scan is significantly abnormal. Ejection fraction is in the 30% range. There is description of a large scar affecting the anteroseptal wall and the inferoseptal wall. There is global hypokinesis. Two-dimensional echo has not been done. When this information was noted the patient was added to my schedule today for cardiac evaluation. He is here with his wife. He seemed surprised by this information as there is no prior cardiac history.  As part of today's evaluation I have reviewed the records concerning the patient's vascular workup. I have also reviewed the primary care records. I have updated the Electronic medical record. In addition I have written the catheterization orders for his catheterization next week.   Allergies  Allergen Reactions  . Codeine Hives    Current Outpatient Prescriptions  Medication Sig Dispense Refill  . ACCU-CHEK AVIVA PLUS test strip CHECK BLOOD SUGAR three times a day 2 hours after meals  100 each  PRN  . aspirin 325 MG tablet Take 325 mg by mouth daily.      Marland Kitchen lisinopril (PRINIVIL,ZESTRIL) 20 MG tablet Take 1 tablet (20 mg total) by mouth 2 (two) times daily.  180 tablet  3  . metFORMIN (GLUCOPHAGE) 500 MG tablet TAKE 2 TABLETS EACH MORNING AND 2 TABLETS AT BEDTIME.  360 tablet  1  . Multiple Vitamin (MULTIVITAMIN) tablet Take 1 tablet by mouth daily.        . sildenafil (VIAGRA) 50 MG tablet Take 50 mg by mouth daily as needed. For sexual activity      . simvastatin  (ZOCOR) 40 MG tablet TAKE 1 TABLET AT BEDTIME.  90 tablet  3  . DISCONTD: lisinopril (PRINIVIL,ZESTRIL) 20 MG tablet TAKE 1 TABLET EVERY MORNING AND 1/2 TABLET IN THE EVENING.  135 tablet  3  . carvedilol (COREG) 3.125 MG tablet Take 1 tablet (3.125 mg total) by mouth 2 (two) times daily.  180 tablet  3    History   Social History  . Marital Status: Married    Spouse Name: N/A    Number of Children: N/A  . Years of Education: N/A   Occupational History  . Not on file.   Social History Main Topics  . Smoking status: Former Smoker    Types: Cigarettes    Quit date: 05/06/2004  . Smokeless tobacco: Never Used   Comment: quit about 7 years ago  . Alcohol Use: 1.2 oz/week    2 Cans of beer per week     beer occassionally  . Drug Use: No  . Sexually Active: Not on file   Other Topics Concern  . Not on file   Social History Narrative  . No narrative on file    Family History  Problem Relation Age of Onset  . Hypertension Mother   . Diabetes Sister   . Heart disease Sister   . Hypertension Sister     Past Medical History  Diagnosis Date  . Diabetes mellitus     type II  . PAD (peripheral  artery disease)   . AAA (abdominal aortic aneurysm) 2011    Per vascular surgery  . HLD (hyperlipidemia)   . Hypertension     white coat HTN-- often elevated in office and controlled on outside checks.  . Low testosterone     Hx of  . CAD (coronary artery disease)     Presumed CAD with nuclear scan October 09, 2011,  large anteroseptal MI and inferior MI. Catheterization scheduled October 15, 2011  . Cardiomyopathy     Nuclear, October 09, 2011, EF 30%, multiple focal wall motion abnormalities  . Ejection fraction < 50%     EF 30%, nuclear, October 09, 2011  . LBBB (left bundle branch block)     LBBB on EKG October 11, 2011,  no prior EKG has been done    No past surgical history on file.  ROS Patient denies fever, chills, headache, sweats, rash, change in vision, change in hearing, chest  pain, cough, nausea vomiting, urinary symptoms. All other systems are reviewed and are negative.  PHYSICAL EXAM The patient is oriented to person time and place. Affect is normal. His wife is in the room. There no carotid bruits. There is no jugulovenous distention. Lungs are clear. Respiratory effort is nonlabored. Cardiac exam reveals S1 and S2. There are no clicks or significant murmurs. The abdomen is soft. There is no significant careful edema. There no musculoskeletal deformities. There are no skin rashes.  Filed Vitals:   10/11/11 1039  BP: 180/90  Pulse: 97  Height: 6\' 3"  (1.905 m)  Weight: 172 lb (78.019 kg)   EKG is done today and reviewed by me. There is left bundle branch block. There is no prior EKG for comparison.  ASSESSMENT & PLAN

## 2011-10-11 NOTE — Assessment & Plan Note (Signed)
There is a history of hypertension. There may be a white coat component to it. His pressure is mildly elevated today. I will increase his lisinopril. In addition it would be optimal to have him on carvedilol for his cardiomyopathy and increased heart rate and abdominal aortic aneurysm. I started a small dose of carvedilol today at 3.125 mg twice a day.

## 2011-10-11 NOTE — Assessment & Plan Note (Signed)
By nuclear scan the patient has significant LV dysfunction. This will be reassessed in the Cath Lab also. He is already on an ACE inhibitor. I have increased the dose and started low dose of carvedilol. His meds will need to be tapered further over time. Once we have catheterization data we can make more decisions.

## 2011-10-11 NOTE — Assessment & Plan Note (Addendum)
The patient has significant: Abdominal aortic aneurysm. This is in the process of being worked up completely. He will need to be repaired. He needs further studies to decide if he can be done with endovascular graft or whether he needs surgery. I spoke directly with Dr. Edilia Bo about the patient. We talked about whether it would be wise to do his heart cath and his peripheral angiogram at the same time. We both agreed that this did not seem to be optimal for this patient. Therefore his heart catheterization be done first.

## 2011-10-11 NOTE — Assessment & Plan Note (Signed)
Based on the nuclear scan the patient has presumed coronary artery disease. Catheterization is being scheduled for next week. He's never had symptoms. It is very important that we understand his anatomy before making decisions about proceeding with a large vascular procedure. The fact that he does not have symptoms does not help [place]. He has never had symptoms in the past. He does describe an unusual sensation that occurs rarely where he feels rushing to his head. He does not have syncope or chest pain with this.

## 2011-10-11 NOTE — H&P (Signed)
  Luke [name]   10/11/2011 10:30 AM Office Visit  MRN: [id number]   Description: 66 year old male  Provider: Shantea Poulton, MD  Department: [facility]        Referring Provider     Luke S Duncan, MD      Diagnoses     Essential hypertension, benign   - Primary    401.1    Cardiomyopathy     425.4    Pre-procedure lab exam     V72.63    AAA (abdominal aortic aneurysm)     441.4    CAD (coronary artery disease)     414.00    Ejection fraction < 50%     785.9    LBBB (left bundle branch block)     426.3      Reason for Visit     Appointment    New patient evaluation         Progress Notes     Luke Hassinger, MD  10/11/2011  1:02 PM  Pended    HPI The patient is seen today for new cardiac evaluation. He has significant abdominal aortic aneurysm. This was being assessed and there were plans for an angiogram. Nuclear scan was arranged to help with cardiac screening. Patient also has diabetes in addition to his vascular disease. There is no history of heart disease. However he is not been evaluated for cardiac problems in the past. There is no recent EKG. He does exercise. He has no chest pain or significant shortness of breath. His nuclear scan is significantly abnormal. Ejection fraction is in the 30% range. There is description of a large scar affecting the anteroseptal wall and the inferoseptal wall. There is global hypokinesis. Two-dimensional echo has not been done. When this information was noted the patient was added to my schedule today for cardiac evaluation. He is here with his wife. He seemed surprised by this information as there is no prior cardiac history.   As part of today's evaluation I have reviewed the records concerning the patient's vascular workup. I have also reviewed the primary care records. I have updated the Electronic medical record. In addition I have written the catheterization orders for his catheterization next week.        Allergies   Allergen  Reactions   .  Codeine  Hives       Current Outpatient Prescriptions   Medication  Sig  Dispense  Refill   .  ACCU-CHEK AVIVA PLUS test strip  CHECK BLOOD SUGAR three times a day 2 hours after meals   100 each   PRN   .  aspirin 325 MG tablet  Take 325 mg by mouth daily.         .  lisinopril (PRINIVIL,ZESTRIL) 20 MG tablet  Take 1 tablet (20 mg total) by mouth 2 (two) times daily.   180 tablet   3   .  metFORMIN (GLUCOPHAGE) 500 MG tablet  TAKE 2 TABLETS EACH MORNING AND 2 TABLETS AT BEDTIME.   360 tablet   1   .  Multiple Vitamin (MULTIVITAMIN) tablet  Take 1 tablet by mouth daily.           .  sildenafil (VIAGRA) 50 MG tablet  Take 50 mg by mouth daily as needed. For sexual activity         .  simvastatin (ZOCOR) 40 MG tablet  TAKE 1 TABLET AT BEDTIME.   90 tablet     3   .  DISCONTD: lisinopril (PRINIVIL,ZESTRIL) 20 MG tablet  TAKE 1 TABLET EVERY MORNING AND 1/2 TABLET IN THE EVENING.   135 tablet   3   .  carvedilol (COREG) 3.125 MG tablet  Take 1 tablet (3.125 mg total) by mouth 2 (two) times daily.   180 tablet   3       History       Social History   .  Marital Status:  Married       Spouse Name:  N/A       Number of Children:  N/A   .  Years of Education:  N/A       Occupational History   .  Not on file.       Social History Main Topics   .  Smoking status:  Former Smoker       Types:  Cigarettes       Quit date:  05/06/2004   .  Smokeless tobacco:  Never Used     Comment: quit about 7 years ago   .  Alcohol Use:  1.2 oz/week       2 Cans of beer per week         beer occassionally   .  Drug Use:  No   .  Sexually Active:  Not on file       Other Topics  Concern   .  Not on file       Social History Narrative   .  No narrative on file       Family History   Problem  Relation  Age of Onset   .  Hypertension  Mother     .  Diabetes  Sister     .  Heart disease  Sister     .  Hypertension  Sister         Past Medical History    Diagnosis  Date   .  Diabetes mellitus         type II   .  PAD (peripheral artery disease)     .  AAA (abdominal aortic aneurysm)  2011       Per vascular surgery   .  HLD (hyperlipidemia)     .  Hypertension         white coat HTN-- often elevated in office and controlled on outside checks.   .  Low testosterone         Hx of   .  CAD (coronary artery disease)         Presumed CAD with nuclear scan October 09, 2011,  large anteroseptal MI and inferior MI. Catheterization scheduled October 15, 2011   .  Cardiomyopathy         Nuclear, October 09, 2011, EF 30%, multiple focal wall motion abnormalities   .  Ejection fraction < 50%         EF 30%, nuclear, October 09, 2011   .  LBBB (left bundle branch block)         LBBB on EKG October 11, 2011,  no prior EKG has been done      No past surgical history on file.   ROS Patient denies fever, chills, headache, sweats, rash, change in vision, change in hearing, chest pain, cough, nausea vomiting, urinary symptoms. All other systems are reviewed and are negative.   PHYSICAL EXAM The patient is oriented to person time and place. Affect is normal.   His wife is in the room. There no carotid bruits. There is no jugulovenous distention. Lungs are clear. Respiratory effort is nonlabored. Cardiac exam reveals S1 and S2. There are no clicks or significant murmurs. The abdomen is soft. There is no significant careful edema. There no musculoskeletal deformities. There are no skin rashes.    Filed Vitals:     10/11/11 1039   BP:  180/90   Pulse:  97   Height:  6' 3" (1.905 m)   Weight:  172 lb (78.019 kg)    EKG is done today and reviewed by me. There is left bundle branch block. There is no prior EKG for comparison.   ASSESSMENT & PLAN        ESSENTIAL HYPERTENSION, BENIGN - Luke Mossberg, MD  10/11/2011 12:56 PM  Signed There is a history of hypertension. There may be a white coat component to it. His pressure is mildly elevated today. I will increase his  lisinopril. In addition it would be optimal to have him on carvedilol for his cardiomyopathy and increased heart rate and abdominal aortic aneurysm. I started a small dose of carvedilol today at 3.125 mg twice a day.  AAA (abdominal aortic aneurysm) - Luke Hoffmeier, MD  10/11/2011  1:00 PM  Addendum The patient has significant: Abdominal aortic aneurysm. This is in the process of being worked up completely. He will need to be repaired. He needs further studies to decide if he can be done with endovascular graft or whether he needs surgery. I spoke directly with Dr. Dickson about the patient. We talked about whether it would be wise to do his heart cath and his peripheral angiogram at the same time. We both agreed that this did not seem to be optimal for this patient. Therefore his heart catheterization be done first.  Previous Version  CAD (coronary artery disease) - Luisdaniel Kenton, MD  10/11/2011 12:58 PM  Signed Based on the nuclear scan the patient has presumed coronary artery disease. Catheterization is being scheduled for next week. He's never had symptoms. It is very important that we understand his anatomy before making decisions about proceeding with a large vascular procedure. The fact that he does not have symptoms does not help us. He has never had symptoms in the past. He does describe an unusual sensation that occurs rarely where he feels rushing to his head. He does not have syncope or chest pain with this.  Cardiomyopathy - Josefita Weissmann, MD  10/11/2011 12:58 PM  Signed By nuclear scan the patient has significant LV dysfunction. This will be reassessed in the Cath Lab also. He is already on an ACE inhibitor. I have increased the dose and started low dose of carvedilol. His meds will need to be tapered further over time. Once we have catheterization data we can make more decisions.  Ejection fraction < 50% - Keoki Mchargue, MD  10/11/2011 12:59 PM  Signed As noted the patient's ejection fraction by  nuclear scan is in the 30% range. He has not had an echo yet. We will obtain more data in the Cath Lab.  LBBB (left bundle branch block) - Phenix Grein, MD  10/11/2011 12:59 PM  Signed The patient has no prior EKGs. He has left bundle branch block. LBBB can cause wall motion abnormalities and nuclear scan abnormalities. However it seems unlikely that his bundle branch block is the basis for the marked abnormalities that he has on the nuclear scan.    

## 2011-10-11 NOTE — Assessment & Plan Note (Signed)
The patient has no prior EKGs. He has left bundle branch block. LBBB can cause wall motion abnormalities and nuclear scan abnormalities. However it seems unlikely that his bundle branch block is the basis for the marked abnormalities that he has on the nuclear scan.

## 2011-10-11 NOTE — Assessment & Plan Note (Signed)
As noted the patient's ejection fraction by nuclear scan is in the 30% range. He has not had an echo yet. We will obtain more data in the Cath Lab.

## 2011-10-14 ENCOUNTER — Encounter (HOSPITAL_COMMUNITY): Admission: RE | Payer: Self-pay | Source: Ambulatory Visit

## 2011-10-14 ENCOUNTER — Ambulatory Visit (HOSPITAL_COMMUNITY)
Admission: RE | Admit: 2011-10-14 | Payer: BC Managed Care – PPO | Source: Ambulatory Visit | Admitting: Vascular Surgery

## 2011-10-14 SURGERY — ABDOMINAL AORTAGRAM
Anesthesia: LOCAL

## 2011-10-15 ENCOUNTER — Encounter: Payer: Self-pay | Admitting: Cardiology

## 2011-10-15 ENCOUNTER — Encounter (HOSPITAL_BASED_OUTPATIENT_CLINIC_OR_DEPARTMENT_OTHER)
Admission: RE | Disposition: A | Discharge status: Home / Self Care | Payer: Self-pay | Source: Ambulatory Visit | Attending: Cardiology

## 2011-10-15 ENCOUNTER — Inpatient Hospital Stay (HOSPITAL_BASED_OUTPATIENT_CLINIC_OR_DEPARTMENT_OTHER)
Admission: RE | Admit: 2011-10-15 | Discharge: 2011-10-15 | Disposition: A | Discharge status: Home / Self Care | Payer: Medicare Other | Source: Ambulatory Visit | Attending: Cardiology | Admitting: Cardiology

## 2011-10-15 DIAGNOSIS — I10 Essential (primary) hypertension: Secondary | ICD-10-CM | POA: Insufficient documentation

## 2011-10-15 DIAGNOSIS — I714 Abdominal aortic aneurysm, without rupture, unspecified: Secondary | ICD-10-CM | POA: Insufficient documentation

## 2011-10-15 DIAGNOSIS — I2510 Atherosclerotic heart disease of native coronary artery without angina pectoris: Secondary | ICD-10-CM

## 2011-10-15 DIAGNOSIS — I447 Left bundle-branch block, unspecified: Secondary | ICD-10-CM | POA: Insufficient documentation

## 2011-10-15 DIAGNOSIS — I428 Other cardiomyopathies: Secondary | ICD-10-CM | POA: Insufficient documentation

## 2011-10-15 DIAGNOSIS — R9439 Abnormal result of other cardiovascular function study: Secondary | ICD-10-CM | POA: Diagnosis not present

## 2011-10-15 DIAGNOSIS — E119 Type 2 diabetes mellitus without complications: Secondary | ICD-10-CM | POA: Insufficient documentation

## 2011-10-15 SURGERY — JV LEFT AND RIGHT HEART CATHETERIZATION WITH CORONARY/GRAFT ANGIOGRAM
Anesthesia: Moderate Sedation

## 2011-10-15 MED ORDER — SODIUM CHLORIDE 0.9 % IJ SOLN: 3.0000 mL | INTRAMUSCULAR | Status: DC | PRN

## 2011-10-15 MED ORDER — SODIUM CHLORIDE 0.9 % IV SOLN
INTRAVENOUS | Status: DC
  Administered 2011-10-15: 08:00:00 via INTRAVENOUS

## 2011-10-15 MED ORDER — SODIUM CHLORIDE 0.9 % IV SOLN
250.0000 mL | INTRAVENOUS | Status: DC | PRN
Start: 2011-10-15 — End: 2011-10-15

## 2011-10-15 MED ORDER — ACETAMINOPHEN 325 MG PO TABS
650.0000 mg | ORAL_TABLET | ORAL | Status: DC | PRN
Start: 2011-10-15 — End: 2011-10-15

## 2011-10-15 MED ORDER — SODIUM CHLORIDE 0.9 % IV SOLN: INTRAVENOUS | Status: DC

## 2011-10-15 MED ORDER — ONDANSETRON HCL 4 MG/2ML IJ SOLN: 4.0000 mg | Freq: Four times a day (QID) | INTRAMUSCULAR | Status: DC | PRN

## 2011-10-15 MED ORDER — SODIUM CHLORIDE 0.9 % IJ SOLN: 3.0000 mL | Freq: Two times a day (BID) | INTRAMUSCULAR | Status: DC

## 2011-10-15 MED ORDER — ASPIRIN 81 MG PO CHEW
324.0000 mg | CHEWABLE_TABLET | ORAL | Status: AC
  Administered 2011-10-15: 324 mg via ORAL

## 2011-10-15 MED ORDER — DIAZEPAM 5 MG PO TABS
5.0000 mg | ORAL_TABLET | ORAL | Status: AC
  Administered 2011-10-15: 5 mg via ORAL

## 2011-10-15 NOTE — CV Procedure (Signed)
   Cardiac Catheterization Procedure Note  Name: Luke [name] MRN: [id number] DOB: 1946-04-20  Procedure: Right Heart Cath, Left Heart Cath, Selective Coronary Angiography, LV angiography  Indication: 66 yo WM with AAA and DM. Recent nuclear stress test was abnormal with inferoseptal and anteroseptal scar and EF of 30%.   Procedural Details: The right groin was prepped, draped, and anesthetized with 1% lidocaine. Using the modified Seldinger technique a 4 French sheath was placed in the right femoral artery and a 7 French sheath was placed in the right femoral vein. A Swan-Ganz catheter was used for the right heart catheterization. Standard protocol was followed for recording of right heart pressures and sampling of oxygen saturations. Fick cardiac output was calculated. Standard Judkins catheters were used for selective coronary angiography and left ventriculography. There were no immediate procedural complications. The patient was transferred to the post catheterization recovery area for further monitoring.  Procedural Findings: Hemodynamics RA 3/2 mean 2 mmHg RV 30/5 mmHg PA 23/8 mean 14 mmHg PCWP 8/6 mean 5 mmHg LV 1124/11 mmHg  AO 126/62 mean 86 mmHg  Oxygen saturations: PA 67%  AO 94%  Cardiac Output (Fick) 4.8 L/min  Cardiac Index (Fick) 2.4 L/min/m2   Coronary angiography: Coronary dominance: right  Left mainstem: Mildly calcified. Otherwise normal  Left anterior descending (LAD): focal 70-80% proximal. Long 40% mid vessel.  Left circumflex (LCx): Normal  Right coronary artery (RCA): Diffusely diseased. 50-60% mid vessel, 70% distal, 70% at bifurcation of PDA/PLOM.  Left ventriculography: Left ventricular systolic function is abnormal. There is severe hypokinesis/akinesis of the mid to distal inferior wall. The mid to distal anterior wall and apex are severely hypokinetic. Overall EF is 30-35%.   Final Conclusions:   1. Moderate to severe 2 vessel obstructive  CAD 2. Severe LV dysfunction.  Recommendations: Will review findings with Dr. Myrtis Ser and patient.   Theron Arista [facility] 10/15/2011, 9:58 AM

## 2011-10-15 NOTE — H&P (View-Only) (Signed)
Luke [name]   10/11/2011 10:30 AM Office Visit  MRN: [id number]   Description: 66 year old male  Provider: Willa Rough, MD  Department: [facility]        Referring Provider     [name], MD      Diagnoses     Essential hypertension, benign   - Primary    401.1    Cardiomyopathy     425.4    Pre-procedure lab exam     V72.63    AAA (abdominal aortic aneurysm)     441.4    CAD (coronary artery disease)     414.00    Ejection fraction < 50%     785.9    LBBB (left bundle branch block)     426.3      Reason for Visit     Appointment    New patient evaluation         Progress Notes     Willa Rough, MD  10/11/2011  1:02 PM  Pended    HPI The patient is seen today for new cardiac evaluation. He has significant abdominal aortic aneurysm. This was being assessed and there were plans for an angiogram. Nuclear scan was arranged to help with cardiac screening. Patient also has diabetes in addition to his vascular disease. There is no history of heart disease. However he is not been evaluated for cardiac problems in the past. There is no recent EKG. He does exercise. He has no chest pain or significant shortness of breath. His nuclear scan is significantly abnormal. Ejection fraction is in the 30% range. There is description of a large scar affecting the anteroseptal wall and the inferoseptal wall. There is global hypokinesis. Two-dimensional echo has not been done. When this information was noted the patient was added to my schedule today for cardiac evaluation. He is here with his wife. He seemed surprised by this information as there is no prior cardiac history.   As part of today's evaluation I have reviewed the records concerning the patient's vascular workup. I have also reviewed the primary care records. I have updated the Electronic medical record. In addition I have written the catheterization orders for his catheterization next week.        Allergies   Allergen  Reactions   .  Codeine  Hives       Current Outpatient Prescriptions   Medication  Sig  Dispense  Refill   .  ACCU-CHEK AVIVA PLUS test strip  CHECK BLOOD SUGAR three times a day 2 hours after meals   100 each   PRN   .  aspirin 325 MG tablet  Take 325 mg by mouth daily.         Marland Kitchen  lisinopril (PRINIVIL,ZESTRIL) 20 MG tablet  Take 1 tablet (20 mg total) by mouth 2 (two) times daily.   180 tablet   3   .  metFORMIN (GLUCOPHAGE) 500 MG tablet  TAKE 2 TABLETS EACH MORNING AND 2 TABLETS AT BEDTIME.   360 tablet   1   .  Multiple Vitamin (MULTIVITAMIN) tablet  Take 1 tablet by mouth daily.           .  sildenafil (VIAGRA) 50 MG tablet  Take 50 mg by mouth daily as needed. For sexual activity         .  simvastatin (ZOCOR) 40 MG tablet  TAKE 1 TABLET AT BEDTIME.   90 tablet  3   .  DISCONTD: lisinopril (PRINIVIL,ZESTRIL) 20 MG tablet  TAKE 1 TABLET EVERY MORNING AND 1/2 TABLET IN THE EVENING.   135 tablet   3   .  carvedilol (COREG) 3.125 MG tablet  Take 1 tablet (3.125 mg total) by mouth 2 (two) times daily.   180 tablet   3       History       Social History   .  Marital Status:  Married       Spouse Name:  N/A       Number of Children:  N/A   .  Years of Education:  N/A       Occupational History   .  Not on file.       Social History Main Topics   .  Smoking status:  Former Smoker       Types:  Cigarettes       Quit date:  05/06/2004   .  Smokeless tobacco:  Never Used     Comment: quit about 7 years ago   .  Alcohol Use:  1.2 oz/week       2 Cans of beer per week         beer occassionally   .  Drug Use:  No   .  Sexually Active:  Not on file       Other Topics  Concern   .  Not on file       Social History Narrative   .  No narrative on file       Family History   Problem  Relation  Age of Onset   .  Hypertension  Mother     .  Diabetes  Sister     .  Heart disease  Sister     .  Hypertension  Sister         Past Medical History    Diagnosis  Date   .  Diabetes mellitus         type II   .  PAD (peripheral artery disease)     .  AAA (abdominal aortic aneurysm)  2011       Per vascular surgery   .  HLD (hyperlipidemia)     .  Hypertension         white coat HTN-- often elevated in office and controlled on outside checks.   .  Low testosterone         Hx of   .  CAD (coronary artery disease)         Presumed CAD with nuclear scan October 09, 2011,  large anteroseptal MI and inferior MI. Catheterization scheduled October 15, 2011   .  Cardiomyopathy         Nuclear, October 09, 2011, EF 30%, multiple focal wall motion abnormalities   .  Ejection fraction < 50%         EF 30%, nuclear, October 09, 2011   .  LBBB (left bundle branch block)         LBBB on EKG October 11, 2011,  no prior EKG has been done      No past surgical history on file.   ROS Patient denies fever, chills, headache, sweats, rash, change in vision, change in hearing, chest pain, cough, nausea vomiting, urinary symptoms. All other systems are reviewed and are negative.   PHYSICAL EXAM The patient is oriented to person time and place. Affect is normal.  His wife is in the room. There no carotid bruits. There is no jugulovenous distention. Lungs are clear. Respiratory effort is nonlabored. Cardiac exam reveals S1 and S2. There are no clicks or significant murmurs. The abdomen is soft. There is no significant careful edema. There no musculoskeletal deformities. There are no skin rashes.    Filed Vitals:     10/11/11 1039   BP:  180/90   Pulse:  97   Height:  6\' 3"  (1.905 m)   Weight:  172 lb (78.019 kg)    EKG is done today and reviewed by me. There is left bundle branch block. There is no prior EKG for comparison.   ASSESSMENT & PLAN        ESSENTIAL HYPERTENSION, BENIGN - Willa Rough, MD  10/11/2011 12:56 PM  Signed There is a history of hypertension. There may be a white coat component to it. His pressure is mildly elevated today. I will increase his  lisinopril. In addition it would be optimal to have him on carvedilol for his cardiomyopathy and increased heart rate and abdominal aortic aneurysm. I started a small dose of carvedilol today at 3.125 mg twice a day.  AAA (abdominal aortic aneurysm) - Willa Rough, MD  10/11/2011  1:00 PM  Addendum The patient has significant: Abdominal aortic aneurysm. This is in the process of being worked up completely. He will need to be repaired. He needs further studies to decide if he can be done with endovascular graft or whether he needs surgery. I spoke directly with Dr. Edilia Bo about the patient. We talked about whether it would be wise to do his heart cath and his peripheral angiogram at the same time. We both agreed that this did not seem to be optimal for this patient. Therefore his heart catheterization be done first.  Previous Version  CAD (coronary artery disease) - Willa Rough, MD  10/11/2011 12:58 PM  Signed Based on the nuclear scan the patient has presumed coronary artery disease. Catheterization is being scheduled for next week. He's never had symptoms. It is very important that we understand his anatomy before making decisions about proceeding with a large vascular procedure. The fact that he does not have symptoms does not help [place]. He has never had symptoms in the past. He does describe an unusual sensation that occurs rarely where he feels rushing to his head. He does not have syncope or chest pain with this.  Cardiomyopathy - Willa Rough, MD  10/11/2011 12:58 PM  Signed By nuclear scan the patient has significant LV dysfunction. This will be reassessed in the Cath Lab also. He is already on an ACE inhibitor. I have increased the dose and started low dose of carvedilol. His meds will need to be tapered further over time. Once we have catheterization data we can make more decisions.  Ejection fraction < 50% - Willa Rough, MD  10/11/2011 12:59 PM  Signed As noted the patient's ejection fraction by  nuclear scan is in the 30% range. He has not had an echo yet. We will obtain more data in the Cath Lab.  LBBB (left bundle branch block) - Willa Rough, MD  10/11/2011 12:59 PM  Signed The patient has no prior EKGs. He has left bundle branch block. LBBB can cause wall motion abnormalities and nuclear scan abnormalities. However it seems unlikely that his bundle branch block is the basis for the marked abnormalities that he has on the nuclear scan.

## 2011-10-15 NOTE — Progress Notes (Signed)
Bedrest begins @ 1010, tegaderm dressing applied to right groin by Venda Rodes.

## 2011-10-15 NOTE — Progress Notes (Signed)
    Cardiac catheterization was done today. The patient's ejection fraction was low. This was similar the nuclear study. There is coronary disease but it is not severe. Medical therapy will be recommended. I reviewed the case with Dr. [place] who did the cath. I will speak with Dr.Dickson about proceeding with angiography to make decisions on how to treat his abdominal aortic aneurysm.  Jerral Bonito, MD

## 2011-10-15 NOTE — Progress Notes (Signed)
Allen's test performed on right hand with normal results.  

## 2011-10-15 NOTE — Interval H&P Note (Signed)
History and Physical Interval Note:  10/15/2011 9:21 AM  Luke [name]  has presented today for surgery, with the diagnosis of cardiomyopathy  The various methods of treatment have been discussed with the patient and family. After consideration of risks, benefits and other options for treatment, the patient has consented to  Procedure(s) (LRB): JV LEFT AND RIGHT HEART CATHETERIZATION WITH CORONARY/GRAFT ANGIOGRAM (N/A) as a surgical intervention .  The patients' history has been reviewed, patient examined, no change in status, stable for surgery.  I have reviewed the patients' chart and labs.  Questions were answered to the patient's satisfaction.     Theron Arista [facility] 10/15/2011 9:21 AM

## 2011-10-16 ENCOUNTER — Other Ambulatory Visit: Payer: Self-pay

## 2011-10-16 LAB — POCT I-STAT 3, VENOUS BLOOD GAS (G3P V)
Acid-base deficit: 3 mmol/L — ABNORMAL HIGH (ref 0.0–2.0)
Bicarbonate: 22.6 mEq/L (ref 20.0–24.0)
O2 Saturation: 67 %
TCO2: 24 mmol/L (ref 0–100)
pCO2, Ven: 43.6 mmHg — ABNORMAL LOW (ref 45.0–50.0)
pH, Ven: 7.322 — ABNORMAL HIGH (ref 7.250–7.300)
pO2, Ven: 38 mmHg (ref 30.0–45.0)

## 2011-10-16 LAB — POCT I-STAT 3, ART BLOOD GAS (G3+)
Acid-base deficit: 2 mmol/L (ref 0.0–2.0)
Bicarbonate: 23.1 mEq/L (ref 20.0–24.0)
O2 Saturation: 94 %
Patient temperature: 11
TCO2: 24 mmol/L (ref 0–100)
pCO2 arterial: 12.7 mmHg — CL (ref 35.0–45.0)
pH, Arterial: 7.754 (ref 7.350–7.450)
pO2, Arterial: 13 mmHg — CL (ref 80.0–100.0)

## 2011-10-16 LAB — POCT I-STAT GLUCOSE
Glucose, Bld: 192 mg/dL — ABNORMAL HIGH (ref 70–99)
Operator id: 221371

## 2011-10-17 ENCOUNTER — Encounter (HOSPITAL_COMMUNITY): Payer: Self-pay | Admitting: Pharmacy Technician

## 2011-10-27 MED ORDER — SODIUM CHLORIDE 0.9 % IV SOLN: INTRAVENOUS | Status: DC

## 2011-10-28 ENCOUNTER — Encounter (HOSPITAL_COMMUNITY)
Admission: RE | Disposition: A | Discharge status: Home / Self Care | Payer: Self-pay | Source: Ambulatory Visit | Attending: Vascular Surgery

## 2011-10-28 ENCOUNTER — Telehealth: Payer: Self-pay | Admitting: Vascular Surgery

## 2011-10-28 ENCOUNTER — Ambulatory Visit (HOSPITAL_COMMUNITY)
Admission: RE | Admit: 2011-10-28 | Discharge: 2011-10-28 | Disposition: A | Discharge status: Home / Self Care | Payer: Medicare Other | Source: Ambulatory Visit | Attending: Vascular Surgery | Admitting: Vascular Surgery

## 2011-10-28 DIAGNOSIS — I714 Abdominal aortic aneurysm, without rupture, unspecified: Secondary | ICD-10-CM | POA: Insufficient documentation

## 2011-10-28 DIAGNOSIS — E785 Hyperlipidemia, unspecified: Secondary | ICD-10-CM | POA: Diagnosis not present

## 2011-10-28 DIAGNOSIS — I10 Essential (primary) hypertension: Secondary | ICD-10-CM | POA: Insufficient documentation

## 2011-10-28 DIAGNOSIS — I70209 Unspecified atherosclerosis of native arteries of extremities, unspecified extremity: Secondary | ICD-10-CM | POA: Diagnosis not present

## 2011-10-28 DIAGNOSIS — E119 Type 2 diabetes mellitus without complications: Secondary | ICD-10-CM | POA: Insufficient documentation

## 2011-10-28 HISTORY — PX: LOWER EXTREMITY ANGIOGRAM: SHX5508

## 2011-10-28 HISTORY — PX: ABDOMINAL AORTAGRAM: SHX5454

## 2011-10-28 LAB — POCT I-STAT, CHEM 8
BUN: 26 mg/dL — ABNORMAL HIGH (ref 6–23)
Calcium, Ion: 1.33 mmol/L — ABNORMAL HIGH (ref 1.12–1.32)
Chloride: 102 mEq/L (ref 96–112)
Creatinine, Ser: 1.1 mg/dL (ref 0.50–1.35)
Glucose, Bld: 146 mg/dL — ABNORMAL HIGH (ref 70–99)
HCT: 43 % (ref 39.0–52.0)
Hemoglobin: 14.6 g/dL (ref 13.0–17.0)
Potassium: 4.2 mEq/L (ref 3.5–5.1)
Sodium: 141 mEq/L (ref 135–145)
TCO2: 26 mmol/L (ref 0–100)

## 2011-10-28 LAB — GLUCOSE, CAPILLARY
Glucose-Capillary: 146 mg/dL — ABNORMAL HIGH (ref 70–99)
Glucose-Capillary: 178 mg/dL — ABNORMAL HIGH (ref 70–99)

## 2011-10-28 SURGERY — ABDOMINAL AORTAGRAM
Anesthesia: LOCAL

## 2011-10-28 MED ORDER — HEPARIN (PORCINE) IN NACL 2-0.9 UNIT/ML-% IJ SOLN
INTRAMUSCULAR | Status: AC
  Filled 2011-10-28: qty 1000

## 2011-10-28 MED ORDER — MIDAZOLAM HCL 2 MG/2ML IJ SOLN
INTRAMUSCULAR | Status: AC
  Filled 2011-10-28: qty 2

## 2011-10-28 MED ORDER — LIDOCAINE HCL (PF) 1 % IJ SOLN
INTRAMUSCULAR | Status: AC
  Filled 2011-10-28: qty 30

## 2011-10-28 MED ORDER — FENTANYL CITRATE 0.05 MG/ML IJ SOLN
INTRAMUSCULAR | Status: AC
  Filled 2011-10-28: qty 2

## 2011-10-28 NOTE — Progress Notes (Signed)
CLIENT UP AND WALKED AND SMALL AMT OF OOZING NOTED LEFT GROIN AND DRESSING CHANGED AND UP AND WALKED AGAIN AND NO FURTHER OOZING OR HEMATOMA NOTED

## 2011-10-28 NOTE — Telephone Encounter (Signed)
Message copied by Fredrich Birks on Mon Oct 28, 2011  1:30 PM ------      Message from: Melene Plan      Created: Mon Oct 28, 2011 10:33 AM      Regarding: FW: charges and follow up                   ----- Message -----         From: [name], MD         Sent: 10/28/2011   8:47 AM           To: Reuel Derby, Melene Plan, RN      Subject: charges and follow up                                    PROCEDURE:       1. Ultrasound-guided access to the left common femoral artery      2. Aortogram with bilateral iliac arteriogram and bilateral lower extremity runoff            SURGEON: Di Kindle. Edilia Bo, MD, FACS            He needs a follow up visit in approximately 2 weeks to discuss elective repair of his aneurysm.

## 2011-10-28 NOTE — Telephone Encounter (Signed)
Patient Notified by voicemail, dpm

## 2011-10-28 NOTE — Progress Notes (Signed)
INTAKE POST PROCEDURE 300CC AND OUTPUT 500CC

## 2011-10-28 NOTE — H&P (Signed)
Vascular and Vein Specialist of [facility]   Patient name: Luke [name] MRN: [id number] DOB: [birth year]    REASON FOR VISIT: follow up of abdominal aortic aneurysm   HPI:  Luke [name] is a 66 y.o. male who I been following with an abdominal aortic aneurysm. When he was seen in our office 6 months ago for a follow duplex was 5.4 cm in maximum diameter. He was set up for a 6 month follow up visit. I had last seen in November of 2010. He does have a history of stable claudication of both lower extremities with bilateral infrainguinal arterial occlusive disease. Since he was seen last she's had no history of abdominal pain or back pain. He has a history of hypertension which she states has been well controlled although he does note that whenever he is at the doctor's office it is increased significantly.   Past Medical History   Diagnosis  Date   .  Diabetes mellitus      type II   .  PAD (peripheral artery disease)    .  AAA (abdominal aortic aneurysm)  2011     Per vascular surgery   .  HLD (hyperlipidemia)    .  Hypertension      white coat HTN-- often elevated in office and controlled on outside checks.   .  Low testosterone      Hx of    Family History   Problem  Relation  Age of Onset   .  Hypertension  Mother    .  Diabetes  Sister    .  Heart disease  Sister    .  Hypertension  Sister     SOCIAL HISTORY:  History   Substance Use Topics   .  Smoking status:  Former Smoker     Types:  Cigarettes     Quit date:  05/06/2004   .  Smokeless tobacco:  Never Used     Comment: quit about 7 years ago    .  Alcohol Use:  1.2 oz/week     2 Cans of beer per week      beer occassionally    Allergies   Allergen  Reactions   .  Codeine     Current Outpatient Prescriptions   Medication  Sig  Dispense  Refill   .  ACCU-CHEK AVIVA PLUS test strip  CHECK BLOOD SUGAR three times a day 2 hours after meals  100 each  PRN   .  aspirin 81 MG tablet  Take 81 mg by mouth daily.       Marland Kitchen  lisinopril (PRINIVIL,ZESTRIL) 20 MG tablet  TAKE 1 TABLET EVERY MORNING AND 1/2 TABLET IN THE EVENING.  135 tablet  3   .  metFORMIN (GLUCOPHAGE) 500 MG tablet  TAKE 2 TABLETS EACH MORNING AND 2 TABLETS AT BEDTIME.  360 tablet  1   .  Multiple Vitamin (MULTIVITAMIN) tablet  Take 1 tablet by mouth daily.     .  sildenafil (VIAGRA) 50 MG tablet  Take 50 mg by mouth daily as needed.     .  simvastatin (ZOCOR) 40 MG tablet  TAKE 1 TABLET AT BEDTIME.  90 tablet  3   .  acetaminophen (TYLENOL) 325 MG tablet  Take 325 mg by mouth daily.      REVIEW OF SYSTEMS: Arly.Keller ] denotes positive finding; [ ]  denotes negative finding  CARDIOVASCULAR: [ ]  chest pain [ ]  chest pressure [ ]   palpitations [ ]  orthopnea  [ ]  dyspnea on exertion Arly.Keller ] claudication- bilateral [ ]  rest pain [ ]  DVT [ ]  phlebitis  PULMONARY: [ ]  productive cough [ ]  asthma [ ]  wheezing  NEUROLOGIC: [ ]  weakness [ ]  paresthesias [ ]  aphasia [ ]  amaurosis [ ]  dizziness  HEMATOLOGIC: [ ]  bleeding problems [ ]  clotting disorders  MUSCULOSKELETAL: [ ]  joint pain [ ]  joint swelling [ ]  leg swelling  GASTROINTESTINAL: [ ]  blood in stool [ ]  hematemesis  GENITOURINARY: [ ]  dysuria [ ]  hematuria  PSYCHIATRIC: [ ]  history of major depression  INTEGUMENTARY: [ ]  rashes [ ]  ulcers  CONSTITUTIONAL: [ ]  fever [ ]  chills  PHYSICAL EXAM:  Filed Vitals:    09/25/11 0954   BP:  193/99   Pulse:  95   Temp:  98 F (36.7 C)   TempSrc:  Oral   Height:  6\' 3"  (1.905 m)   Weight:  173 lb (78.472 kg)    Body mass index is 21.62 kg/(m^2).  GENERAL: The patient is a well-nourished male, in no acute distress. The vital signs are documented above.  CARDIOVASCULAR: There is a regular rate and rhythm without significant murmur appreciated. I do not detect carotid bruits.he has palpable femoral pulses. I cannot palpate pedal pulses. Both feet appear adequately perfused. He has no significant lower extremity swelling.  PULMONARY: There is good air exchange  bilaterally without wheezing or rales.  ABDOMEN: Soft and non-tender with normal pitched bowel sounds. His aneurysm is palpable and nontender. He has a ventral hernia.  MUSCULOSKELETAL: There are no major deformities or cyanosis.  NEUROLOGIC: No focal weakness or paresthesias are detected.  SKIN: There are no ulcers or rashes noted.  PSYCHIATRIC: The patient has a normal affect.   DATA:  I have independently interpreted his duplex of his aneurysm today which shows that the maximum diameter of the aneurysm is 5.9 cm. This is increased from 5.4 cm in November of 2012. Of note because of overlying bowel gas it was not possible to visualize the iliac arteries.   MEDICAL ISSUES:   AAA (abdominal aortic aneurysm)  His aneurysm has now enlarged to 5.9 cm. This reason I have recommended elective repair of the aneurysm. Without repair the risk of rupture is approximately 5-10% per year. In order to plan elective repair I have ordered a CT of the abdomen and pelvis and also we will proceed with arteriography. These studies will allow [place] to determine if he is a good candidate for endovascular aneurysm repair. We will also set him up for a Cardiolite at the [place] office. Once his workup is complete we can schedule elective repair of his aneurysm. I have reviewed with the patient the indications for arteriography. In addition, I have reviewed the potential complications of arteriography including but not limited to: Bleeding, arterial injury, arterial thrombosis, dye action, renal insufficiency, or other unpredictable medical problems.   PVD (peripheral vascular disease)  His claudication symptoms remained stable. He is scheduled for arteriography which will allow [place] to further assess his infrainguinal arterial occlusive disease. Fortunately he is not a smoker. He states his blood pressure has been well controlled on his current medications. He states that his cholesterol is followed by his primary care  physician.   Bryten Maher S  [facility]

## 2011-10-28 NOTE — Discharge Instructions (Signed)

## 2011-10-28 NOTE — Op Note (Signed)
PATIENT: Luke [name]   MRN: [id number] DOB: 31-May-1945    DATE OF PROCEDURE: 10/28/2011  INDICATIONS: Luke [name] is a 66 y.o. male with a 5.9 cm infrarenal abdominal aortic aneurysm. He is brought in for diagnostic arteriography in order to plan elective repair.  PROCEDURE:  1. Ultrasound-guided access to the left common femoral artery 2. Aortogram with bilateral iliac arteriogram and bilateral lower extremity runoff  SURGEON: Di Kindle. Edilia Bo, MD, FACS  ANESTHESIA: local with sedation   EBL: minimal  TECHNIQUE: The patient was brought to the peripheral vascular lab and sedated with 1 mg of Versed and 50 mcg of fentanyl. Both groins were prepped and draped in the usual sterile fashion. There was a small hematoma in the right groin therefore elected to cannulate the left groin. Under ultrasound guidance, after skin was anesthetized, the left common femoral artery was cannulated and a guidewire introduced into the common iliac artery on the left. A 5 French sheath was introduced over the wire. I was unable to pass a wire I therefore used an end hole catheter and an angled glide wire in order to advance the wire into the infrarenal aorta. Once the wire and advanced, the endhole catheter was exchanged for a Marker pigtail catheter which was positioned at the L1 vertebral body. Flush aortogram was obtained. The catheter was then advanced above the renals and lateral projection was obtained. The catheter was then brought down above the aortic bifurcation and oblique iliac projections were obtained. Bilateral lower extremity runoff films were obtained. At the completion the sheath was removed and pressure held for hemostasis. No immediate complications were noted.  FINDINGS:  1. There are single renal arteries bilaterally with no significant renal artery stenosis identified. 2. The neck of the aneurysm has moderate diffuse atherosclerotic disease. The size of the aneurysm cannot be  accurately determined by this study. The aneurysm appears to end at the aortic bifurcation. There is moderate diffuse disease of the proximal right common iliac artery. There is severe diffuse disease of the left common iliac artery. The external iliac and hypogastric arteries are open bilaterally. 3. On the right side, the common femoral and deep femoral artery are patent. His moderate diffuse disease of the proximal superficial femoral artery which is then occluded in the proximal thigh. There is reconstitution of a blind popliteal segment just above the knee. The below-knee popliteal anterior tibial and tibial peroneal trunk are occluded. There is reconstitution of the anterior tibial peroneal and posterior tibial in the mid calf. This poor visualization distally although the posterior tibial is noted to be patent to the ankle. 4. On the left side, there is posterior plaque in the distal left common femoral artery. The deep femoral and superficial femoral artery are patent. There is moderate disease at the adductor canal on the left and also the popliteal artery. The anterior tibial artery on the left is occluded. Posterior tibial and peroneal artery a patent on the left.  Waverly Ferrari, MD, FACS [facility]  DATE OF DICTATION:   10/28/2011

## 2011-11-01 ENCOUNTER — Other Ambulatory Visit (INDEPENDENT_AMBULATORY_CARE_PROVIDER_SITE_OTHER): Payer: Medicare Other

## 2011-11-01 DIAGNOSIS — Z125 Encounter for screening for malignant neoplasm of prostate: Secondary | ICD-10-CM

## 2011-11-01 DIAGNOSIS — E119 Type 2 diabetes mellitus without complications: Secondary | ICD-10-CM | POA: Diagnosis not present

## 2011-11-01 LAB — HEMOGLOBIN A1C: Hgb A1c MFr Bld: 7.5 % — ABNORMAL HIGH (ref 4.6–6.5)

## 2011-11-02 LAB — PSA, MEDICARE: PSA: 1.43 ng/mL (ref <=4.00)

## 2011-11-04 ENCOUNTER — Encounter: Payer: BC Managed Care – PPO | Admitting: Family Medicine

## 2011-11-08 ENCOUNTER — Encounter: Payer: Self-pay | Admitting: Family Medicine

## 2011-11-08 ENCOUNTER — Ambulatory Visit (INDEPENDENT_AMBULATORY_CARE_PROVIDER_SITE_OTHER): Payer: Medicare Other | Admitting: Family Medicine

## 2011-11-08 VITALS — BP 170/80 | HR 79 | Temp 97.8°F | Ht 75.0 in | Wt 173.0 lb

## 2011-11-08 DIAGNOSIS — M25519 Pain in unspecified shoulder: Secondary | ICD-10-CM

## 2011-11-08 DIAGNOSIS — I2510 Atherosclerotic heart disease of native coronary artery without angina pectoris: Secondary | ICD-10-CM

## 2011-11-08 DIAGNOSIS — I714 Abdominal aortic aneurysm, without rupture, unspecified: Secondary | ICD-10-CM

## 2011-11-08 DIAGNOSIS — Z139 Encounter for screening, unspecified: Secondary | ICD-10-CM | POA: Diagnosis not present

## 2011-11-08 DIAGNOSIS — E1159 Type 2 diabetes mellitus with other circulatory complications: Secondary | ICD-10-CM

## 2011-11-08 DIAGNOSIS — I739 Peripheral vascular disease, unspecified: Secondary | ICD-10-CM

## 2011-11-08 DIAGNOSIS — E119 Type 2 diabetes mellitus without complications: Secondary | ICD-10-CM | POA: Diagnosis not present

## 2011-11-08 DIAGNOSIS — I10 Essential (primary) hypertension: Secondary | ICD-10-CM

## 2011-11-08 DIAGNOSIS — Z0000 Encounter for general adult medical examination without abnormal findings: Secondary | ICD-10-CM | POA: Insufficient documentation

## 2011-11-08 NOTE — Assessment & Plan Note (Signed)
Controlled out of office, continue current meds.

## 2011-11-08 NOTE — Assessment & Plan Note (Signed)
Continue work on diet.  Will recheck in 3 months.  I don't want to induce hypoglycemia with other meds given the upcoming plans for surgery.

## 2011-11-08 NOTE — Assessment & Plan Note (Signed)
W/o claudication now.  Continue risk factor modification.

## 2011-11-08 NOTE — Assessment & Plan Note (Signed)
With f/u pending.

## 2011-11-08 NOTE — Patient Instructions (Addendum)
Recheck A1c in 3 months, come see a few days later- visit.  Take care.  Use the shoulder exercises in the meantime.  I'll await the reports from vascular.  Take care.

## 2011-11-08 NOTE — Progress Notes (Signed)
I have personally reviewed the Medicare Annual Wellness questionnaire and have noted 1. The patient's medical and social history 2. Their use of alcohol, tobacco or illicit drugs 3. Their current medications and supplements 4. The patient's functional ability including ADL's, fall risks, home safety risks and hearing or visual             impairment. 5. Diet and physical activities 6. Evidence for depression or mood disorders  The patients weight, height, BMI have been recorded in the chart and visual acuity is per eye clinic.  I have made referrals, counseling and provided education to the patient based review of the above and I have provided the pt with a written personalized care plan for preventive services.  See scanned forms.  Routine anticipatory guidance given to patient.  See health maintenance. Tetanus 2012 Flu yearly Shingles encouraged PNA due Colonoscopy 2011 Prostate cancer screening- DRE wnl and PSA not elevated Advance directive d/wpt.    CAD s/p cath; med mgmt rec'd.  No chest pain.  Unknown date of likely MI.    AAA with plan for vascular f/u next week.  No sx, no abd pain.   H/o PVD with prev breakthrough claudication that has improved with exercise and medical treatment- no breakthrough sx now.   Hypertension:    Using medication without problems or lightheadedness: yes Chest pain with exertion:no Edema:no Short of breath:no Average home BPs: lower on check at home, 120-130s/70-80s  Diabetes:  Using medications without difficulties:yes Hypoglycemic episodes:no Hyperglycemic episodes: no Feet problems: no Blood Sugars averaging: ~115 midday, 130 or lower later in the day eye exam within last year: due, discussed.  L>R shoulder pain that is positional.  No trauma. No chest pain.  Pain with ext>int rotation.  PMH and SH reviewed  Meds, vitals, and allergies reviewed.   ROS: See HPI.  Otherwise negative.    GEN: nad, alert and oriented HEENT: mucous  membranes moist NECK: supple w/o LA CV: rrr. PULM: ctab, no inc wob ABD: soft, +bs EXT: no edema SKIN: no acute rash L shoulder with pain on ext>int rotation w/o arm drop.  Distally nv intact.  + scap assist.  Prostate gland firm and smooth, no enlargement, nodularity, tenderness, mass, asymmetry or induration. Ext hemorrhoids noted.  DP pulses palpable B, L>R

## 2011-11-08 NOTE — Assessment & Plan Note (Signed)
CP free, med mgmt per cards

## 2011-11-08 NOTE — Assessment & Plan Note (Signed)
Cuff impingement improved with scap assist.  D/wpt AV:[id number] and exercise, handout given for home exercise routine.  He agrees.

## 2011-11-12 ENCOUNTER — Encounter: Payer: Self-pay | Admitting: Vascular Surgery

## 2011-11-13 ENCOUNTER — Ambulatory Visit (INDEPENDENT_AMBULATORY_CARE_PROVIDER_SITE_OTHER): Payer: Medicare Other | Admitting: Vascular Surgery

## 2011-11-13 ENCOUNTER — Encounter: Payer: Self-pay | Admitting: Vascular Surgery

## 2011-11-13 VITALS — BP 194/97 | HR 92 | Resp 18 | Ht 75.0 in | Wt 170.0 lb

## 2011-11-13 DIAGNOSIS — I714 Abdominal aortic aneurysm, without rupture: Secondary | ICD-10-CM | POA: Diagnosis not present

## 2011-11-13 NOTE — Assessment & Plan Note (Signed)
This patient has a 6 cm infrarenal abdominal aortic aneurysm. His risk of rupture is approximately 5-10% per year. For this reason I have recommended elective repair. He does have some atherosclerotic disease in his proximal left common iliac artery; however,  I think there is a good chance that we can still address his aneurysm with an endovascular stent graft. Certainly with if we were unable to get the sheath through the left common iliac artery he could potentially have to be converted to an open repair. Also, because of the atherosclerotic disease he is at higher risk for iliac artery injury which could potentially necessitate open repair. We have discussed the advantages and disadvantages of open versus endovascular repair he would like to proceed with endovascular repair. I have discussed the potential complications of EVAR, including, but not limited to: bleeding, infection, arterial injury, graft migration, endoleak, renal failure, MI or other unpredictable medical problems. We have discussed the possibility of having to convert to open repair. We have also discussed the potential complications of open repair including a 4-5% risk of mortality or major morbidity.We also discussed the need for continued lifelong follow-up after EVAR. All of the patients questions were answered and they are agreeable to proceed with surgery. We will try to schedule his surgery for 12/03/2011.

## 2011-11-13 NOTE — Progress Notes (Signed)
Vascular and Vein Specialist of [facility]  Patient name: Luke [name] MRN: [id number] DOB: 1945-05-08 Sex: male  REASON FOR ADMISSION: 6 cm infrarenal abdominal aortic aneurysm.  HPI: Luke [name] is a 66 y.o. male who I have been following with an abdominal aortic aneurysm. His aneurysm enlarged from 5.4 cm 6 months prior and is now 6 cm. He has had no significant abdominal pain or back pain. He's had a CT scan and arteriogram which demonstrates some calcific disease especially in the left common iliac artery. However he is felt to be a reasonable candidate for EVAR. He does have a history of coronary artery disease and underwent cardiac catheterization by Dr. Peter Swaziland. This showed moderate to severe two-vessel coronary artery disease with severe LV dysfunction. His overall ejection fraction was 30-35%. I have discussed the case with Dr. Myrtis Ser and we felt that endovascular repair would be best given his cardiac situation. He now presents for elective repair.  Past Medical History  Diagnosis Date  . Diabetes mellitus     type II  . PAD (peripheral artery disease)   . AAA (abdominal aortic aneurysm) 2011    Per vascular surgery  . HLD (hyperlipidemia)   . Hypertension     white coat HTN-- often elevated in office and controlled on outside checks.  . Low testosterone     Hx of  . CAD (coronary artery disease)     Presumed CAD with nuclear scan October 09, 2011,  large anteroseptal MI and inferior MI. Catheterization scheduled October 15, 2011  . Cardiomyopathy     Nuclear, October 09, 2011, EF 30%, multiple focal wall motion abnormalities  . Ejection fraction < 50%     EF 30%, nuclear, October 09, 2011  . LBBB (left bundle branch block)     LBBB on EKG October 11, 2011,  no prior EKG has been done    Family History  Problem Relation Age of Onset  . Hypertension Mother   . Diabetes Sister   . Heart disease Sister   . Hypertension Sister     SOCIAL HISTORY: History  Substance Use Topics    . Smoking status: Former Smoker    Types: Cigarettes    Quit date: 05/06/2004  . Smokeless tobacco: Never Used   Comment: quit about 7 years ago  . Alcohol Use: 1.2 oz/week    2 Cans of beer per week     beer occassionally    Allergies  Allergen Reactions  . Codeine Hives    Current Outpatient Prescriptions  Medication Sig Dispense Refill  . ACCU-CHEK AVIVA PLUS test strip CHECK BLOOD SUGAR three times a day 2 hours after meals  100 each  PRN  . aspirin 325 MG tablet Take 325 mg by mouth daily.      . carvedilol (COREG) 3.125 MG tablet Take 1 tablet (3.125 mg total) by mouth 2 (two) times daily.  180 tablet  3  . lisinopril (PRINIVIL,ZESTRIL) 20 MG tablet Take 20 mg by mouth 2 (two) times daily.      . metFORMIN (GLUCOPHAGE) 500 MG tablet Take 1,000 mg by mouth 2 (two) times daily with a meal.      . Multiple Vitamin (MULTIVITAMIN) tablet Take 1 tablet by mouth daily.        . sildenafil (VIAGRA) 50 MG tablet Take 50 mg by mouth daily as needed. For sexual activity      . simvastatin (ZOCOR) 40 MG tablet Take 40 mg by mouth  at bedtime.        REVIEW OF SYSTEMS: Arly.Keller ] denotes positive finding; [  ] denotes negative finding CARDIOVASCULAR:  [ ]  chest pain   [ ]  chest pressure   [ ]  palpitations   [ ]  orthopnea   [ ]  dyspnea on exertion   Arly.Keller ] claudication- bilateral   [ ]  rest pain   [ ]  DVT   [ ]  phlebitis PULMONARY:   [ ]  productive cough   [ ]  asthma   [ ]  wheezing NEUROLOGIC:   [ ]  weakness  [ ]  paresthesias  [ ]  aphasia  [ ]  amaurosis  [ ]  dizziness HEMATOLOGIC:   [ ]  bleeding problems   [ ]  clotting disorders MUSCULOSKELETAL:  [ ]  joint pain   [ ]  joint swelling [ ]  leg swelling GASTROINTESTINAL: [ ]   blood in stool  [ ]   hematemesis GENITOURINARY:  [ ]   dysuria  [ ]   hematuria PSYCHIATRIC:  [ ]  history of major depression INTEGUMENTARY:  [ ]  rashes  [ ]  ulcers CONSTITUTIONAL:  [ ]  fever   [ ]  chills  PHYSICAL EXAM: Filed Vitals:   11/13/11 1427  BP: 194/97  Pulse: 92   Resp: 18  Height: 6\' 3"  (1.905 m)  Weight: 170 lb (77.111 kg)  SpO2: 100%   Body mass index is 21.25 kg/(m^2). GENERAL: The patient is a well-nourished male, in no acute distress. The vital signs are documented above. CARDIOVASCULAR: There is a regular rate and rhythm without significant murmur appreciated. I do not detect carotid bruits. He has palpable femoral pulses although the left femoral pulse is slightly diminished. I cannot palpate pedal pulses. He has no significant lower extremity swelling. PULMONARY: There is good air exchange bilaterally without wheezing or rales. ABDOMEN: Soft and non-tender with normal pitched bowel sounds. His aneurysm is easily palpable and nontender. MUSCULOSKELETAL: There are no major deformities or cyanosis. NEUROLOGIC: No focal weakness or paresthesias are detected. SKIN: There are no ulcers or rashes noted. PSYCHIATRIC: The patient has a normal affect.  DATA:  I have reviewed his heart cath results as described above. He has two-vessel coronary artery disease.  Arteriogram demonstrates atherosclerotic disease of the proximal left common iliac artery. In addition he has a long segment right superficial femoral artery occlusion with some moderate diffuse disease on the left side. He also has some calcific plaque in the left common femoral artery and also some calcific plaque on the right.  MEDICAL ISSUES:  AAA (abdominal aortic aneurysm) This patient has a 6 cm infrarenal abdominal aortic aneurysm. His risk of rupture is approximately 5-10% per year. For this reason I have recommended elective repair. He does have some atherosclerotic disease in his proximal left common iliac artery; however,  I think there is a good chance that we can still address his aneurysm with an endovascular stent graft. Certainly with if we were unable to get the sheath through the left common iliac artery he could potentially have to be converted to an open repair. Also, because  of the atherosclerotic disease he is at higher risk for iliac artery injury which could potentially necessitate open repair. We have discussed the advantages and disadvantages of open versus endovascular repair he would like to proceed with endovascular repair. I have discussed the potential complications of EVAR, including, but not limited to: bleeding, infection, arterial injury, graft migration, endoleak, renal failure, MI or other unpredictable medical problems. We have discussed the possibility of having to convert to  open repair. We have also discussed the potential complications of open repair including a 4-5% risk of mortality or major morbidity.We also discussed the need for continued lifelong follow-up after EVAR. All of the patients questions were answered and they are agreeable to proceed with surgery. We will try to schedule his surgery for 12/03/2011.    DICKSON,CHRISTOPHER S [facility] of [place] Office: [phone]

## 2011-11-14 ENCOUNTER — Ambulatory Visit (INDEPENDENT_AMBULATORY_CARE_PROVIDER_SITE_OTHER): Payer: Medicare Other

## 2011-11-14 ENCOUNTER — Telehealth: Payer: Self-pay

## 2011-11-14 DIAGNOSIS — Z23 Encounter for immunization: Secondary | ICD-10-CM | POA: Diagnosis not present

## 2011-11-14 NOTE — Telephone Encounter (Signed)
Pt wanted Dr Para March to know pt is having repair of aneurysm 12/03/11 by Dr Edilia Bo; will do stent instead of open surgery.

## 2011-11-14 NOTE — Telephone Encounter (Signed)
Noted  

## 2011-11-19 ENCOUNTER — Encounter (HOSPITAL_COMMUNITY): Payer: Self-pay | Admitting: Pharmacy Technician

## 2011-11-20 ENCOUNTER — Ambulatory Visit: Payer: Medicare Other | Admitting: Vascular Surgery

## 2011-11-22 ENCOUNTER — Ambulatory Visit: Payer: Medicare Other | Admitting: Vascular Surgery

## 2011-11-25 ENCOUNTER — Other Ambulatory Visit: Payer: Self-pay

## 2011-11-27 ENCOUNTER — Encounter (HOSPITAL_COMMUNITY): Payer: Self-pay

## 2011-11-27 ENCOUNTER — Encounter (HOSPITAL_COMMUNITY)
Admission: RE | Admit: 2011-11-27 | Discharge: 2011-11-27 | Disposition: A | Discharge status: Home / Self Care | Payer: Medicare Other | Source: Ambulatory Visit | Attending: Vascular Surgery | Admitting: Vascular Surgery

## 2011-11-27 ENCOUNTER — Ambulatory Visit (HOSPITAL_COMMUNITY)
Admission: RE | Admit: 2011-11-27 | Discharge: 2011-11-27 | Disposition: A | Discharge status: Home / Self Care | Payer: Medicare Other | Source: Ambulatory Visit | Attending: Vascular Surgery | Admitting: Vascular Surgery

## 2011-11-27 DIAGNOSIS — J438 Other emphysema: Secondary | ICD-10-CM | POA: Diagnosis not present

## 2011-11-27 DIAGNOSIS — Z01812 Encounter for preprocedural laboratory examination: Secondary | ICD-10-CM | POA: Diagnosis not present

## 2011-11-27 DIAGNOSIS — Z01811 Encounter for preprocedural respiratory examination: Secondary | ICD-10-CM | POA: Diagnosis not present

## 2011-11-27 DIAGNOSIS — Z01810 Encounter for preprocedural cardiovascular examination: Secondary | ICD-10-CM | POA: Insufficient documentation

## 2011-11-27 HISTORY — DX: Anxiety disorder, unspecified: F41.9

## 2011-11-27 LAB — URINALYSIS, ROUTINE W REFLEX MICROSCOPIC
Bilirubin Urine: NEGATIVE
Glucose, UA: 100 mg/dL — AB
Hgb urine dipstick: NEGATIVE
Ketones, ur: NEGATIVE mg/dL
Leukocytes, UA: NEGATIVE
Nitrite: NEGATIVE
Protein, ur: NEGATIVE mg/dL
Specific Gravity, Urine: 1.008 (ref 1.005–1.030)
Urobilinogen, UA: 0.2 mg/dL (ref 0.0–1.0)
pH: 5.5 (ref 5.0–8.0)

## 2011-11-27 LAB — TYPE AND SCREEN
ABO/RH(D): A NEG
Antibody Screen: NEGATIVE

## 2011-11-27 LAB — PROTIME-INR
INR: 1.05 (ref 0.00–1.49)
Prothrombin Time: 13.9 seconds (ref 11.6–15.2)

## 2011-11-27 LAB — BLOOD GAS, ARTERIAL
Acid-base deficit: 1.2 mmol/L (ref 0.0–2.0)
Bicarbonate: 22.7 mEq/L (ref 20.0–24.0)
Drawn by: 206361
FIO2: 0.21 %
O2 Saturation: 96.7 %
Patient temperature: 98.6
TCO2: 23.9 mmol/L (ref 0–100)
pCO2 arterial: 36.5 mmHg (ref 35.0–45.0)
pH, Arterial: 7.412 (ref 7.350–7.450)
pO2, Arterial: 88.4 mmHg (ref 80.0–100.0)

## 2011-11-27 LAB — COMPREHENSIVE METABOLIC PANEL
ALT: 13 U/L (ref 0–53)
AST: 14 U/L (ref 0–37)
Albumin: 4 g/dL (ref 3.5–5.2)
Alkaline Phosphatase: 52 U/L (ref 39–117)
BUN: 27 mg/dL — ABNORMAL HIGH (ref 6–23)
CO2: 20 mEq/L (ref 19–32)
Calcium: 9.9 mg/dL (ref 8.4–10.5)
Chloride: 102 mEq/L (ref 96–112)
Creatinine, Ser: 0.88 mg/dL (ref 0.50–1.35)
GFR calc Af Amer: >90 mL/min (ref >90)
GFR calc non Af Amer: 88 mL/min — ABNORMAL LOW (ref >90)
Glucose, Bld: 230 mg/dL — ABNORMAL HIGH (ref 70–99)
Potassium: 4.7 mEq/L (ref 3.5–5.1)
Sodium: 137 mEq/L (ref 135–145)
Total Bilirubin: 0.5 mg/dL (ref 0.3–1.2)
Total Protein: 7 g/dL (ref 6.0–8.3)

## 2011-11-27 LAB — CBC
HCT: 39.7 % (ref 39.0–52.0)
Hemoglobin: 14.1 g/dL (ref 13.0–17.0)
MCH: 32.9 pg (ref 26.0–34.0)
MCHC: 35.5 g/dL (ref 30.0–36.0)
MCV: 92.5 fL (ref 78.0–100.0)
Platelets: 199 10*3/uL (ref 150–400)
RBC: 4.29 MIL/uL (ref 4.22–5.81)
RDW: 13.1 % (ref 11.5–15.5)
WBC: 11.2 10*3/uL — ABNORMAL HIGH (ref 4.0–10.5)

## 2011-11-27 LAB — ABO/RH: ABO/RH(D): A NEG

## 2011-11-27 LAB — SURGICAL PCR SCREEN
MRSA, PCR: NEGATIVE
Staphylococcus aureus: NEGATIVE

## 2011-11-27 LAB — APTT: aPTT: 31 seconds (ref 24–37)

## 2011-11-27 NOTE — Progress Notes (Signed)
Spoke with Ramon Dredge, /w cardiac notes review.  Will leave chart for Anesth. Review.

## 2011-11-27 NOTE — Pre-Procedure Instructions (Signed)
20 Luke [name]  11/27/2011   Your procedure is scheduled on:  12/03/2011  Report to [facility] at 7:30 AM.  Call this number if you have problems the morning of surgery: [phone]   Remember:   Do not eat food & drink liquids :After Midnight.      Take these medicines the morning of surgery with A SIP OF WATER: Carvedilol   Do not wear jewelry, make-up or nail polish.  Do not wear lotions, powders, or perfumes. You may wear deodorant.  Do not shave 48 hours prior to surgery. Men may shave face and neck.  Do not bring valuables to the hospital.  Contacts, dentures or bridgework may not be worn into surgery.  Leave suitcase in the car. After surgery it may be brought to your room.  For patients admitted to the hospital, checkout time is 11:00 AM the day of discharge.   Patients discharged the day of surgery will not be allowed to drive home.  Name and phone number of your driver: /with wife  Special Instructions: CHG Shower Use Special Wash: 1/2 bottle night before surgery and 1/2 bottle morning of surgery.   Please read over the following fact sheets that you were given: Pain Booklet, Coughing and Deep Breathing, Blood Transfusion Information, MRSA Information and Surgical Site Infection Prevention

## 2011-11-28 ENCOUNTER — Encounter (HOSPITAL_COMMUNITY): Payer: Self-pay

## 2011-11-28 NOTE — Consult Note (Signed)
Anesthesia Chart Review:  Patient is a 66 year old male scheduled for EVAR of AAA on 12/03/11 by Dr. Edilia Bo.  Other history includes former smoker, DM2, HTN, CAD, cardiomyopathy, left BBB, anxiety, low T, HLD, PAD.  PCP is Dr. Crawford Givens.   His Cardiologist is Dr. Myrtis Ser who cleared the patient for this procedure.    He had an abnormal stress test on 10/09/11 that showed: Abnormal stress nuclear study. There is evidence of a previous large anterior septal MI with an inferior septal MI. The LV is markedly dilated with global hypokinesis. There is severe hypokinesis in the anterior septal and inferior septal walls. LV Ejection Fraction: 30%.   He subsequently had a cardiac cath on 10/15/11 that sshowed: 1. Moderate to severe 2 vessel obstructive CAD (prox LAD 70-80%, mid LAD 40%, LCX normal, mid RCA 50-60%, distal RCA 70%, bifurcation of PDA/PLOM 70%). 2. Severe LV dysfunction. EF 30-35%. Ultimately a decision was made to treat him medically.  EKG on 10/11/11 showed NSR, left BBB.  CXR on 11/27/11 showed emphysema without acute disease.  Labs noted.  Glucose is 230.  He'll get a CBG on the day of surgery.  Shonna Chock, PA-C

## 2011-12-02 MED ORDER — DEXTROSE 5 % IV SOLN
1.5000 g | INTRAVENOUS | Status: AC
  Administered 2011-12-03: 1.5 g via INTRAVENOUS
  Filled 2011-12-02: qty 1.5

## 2011-12-02 MED ORDER — SODIUM CHLORIDE 0.9 % IV SOLN: INTRAVENOUS | Status: DC

## 2011-12-03 ENCOUNTER — Inpatient Hospital Stay (HOSPITAL_COMMUNITY): Payer: Medicare Other

## 2011-12-03 ENCOUNTER — Encounter (HOSPITAL_COMMUNITY): Payer: Self-pay | Admitting: *Deleted

## 2011-12-03 ENCOUNTER — Encounter (HOSPITAL_COMMUNITY): Payer: Self-pay | Admitting: Vascular Surgery

## 2011-12-03 ENCOUNTER — Inpatient Hospital Stay (HOSPITAL_COMMUNITY)
Admission: RE | Admit: 2011-12-03 | Discharge: 2011-12-04 | DRG: 238 | Disposition: A | Discharge status: Home / Self Care | Payer: Medicare Other | Source: Ambulatory Visit | Attending: Vascular Surgery | Admitting: Vascular Surgery

## 2011-12-03 ENCOUNTER — Inpatient Hospital Stay (HOSPITAL_COMMUNITY): Payer: Medicare Other | Admitting: Vascular Surgery

## 2011-12-03 ENCOUNTER — Encounter (HOSPITAL_COMMUNITY)
Admission: RE | Disposition: A | Discharge status: Home / Self Care | Payer: Self-pay | Source: Ambulatory Visit | Attending: Vascular Surgery

## 2011-12-03 ENCOUNTER — Other Ambulatory Visit: Payer: Self-pay | Admitting: Thoracic Diseases

## 2011-12-03 DIAGNOSIS — Z87891 Personal history of nicotine dependence: Secondary | ICD-10-CM | POA: Diagnosis not present

## 2011-12-03 DIAGNOSIS — I252 Old myocardial infarction: Secondary | ICD-10-CM

## 2011-12-03 DIAGNOSIS — Z79899 Other long term (current) drug therapy: Secondary | ICD-10-CM

## 2011-12-03 DIAGNOSIS — I10 Essential (primary) hypertension: Secondary | ICD-10-CM | POA: Diagnosis present

## 2011-12-03 DIAGNOSIS — Z7982 Long term (current) use of aspirin: Secondary | ICD-10-CM

## 2011-12-03 DIAGNOSIS — K5900 Constipation, unspecified: Secondary | ICD-10-CM | POA: Diagnosis not present

## 2011-12-03 DIAGNOSIS — I428 Other cardiomyopathies: Secondary | ICD-10-CM | POA: Diagnosis present

## 2011-12-03 DIAGNOSIS — I714 Abdominal aortic aneurysm, without rupture, unspecified: Principal | ICD-10-CM | POA: Diagnosis present

## 2011-12-03 DIAGNOSIS — I708 Atherosclerosis of other arteries: Secondary | ICD-10-CM | POA: Diagnosis present

## 2011-12-03 DIAGNOSIS — E119 Type 2 diabetes mellitus without complications: Secondary | ICD-10-CM | POA: Diagnosis present

## 2011-12-03 DIAGNOSIS — I2510 Atherosclerotic heart disease of native coronary artery without angina pectoris: Secondary | ICD-10-CM | POA: Diagnosis present

## 2011-12-03 DIAGNOSIS — F411 Generalized anxiety disorder: Secondary | ICD-10-CM | POA: Diagnosis present

## 2011-12-03 DIAGNOSIS — I70209 Unspecified atherosclerosis of native arteries of extremities, unspecified extremity: Secondary | ICD-10-CM | POA: Diagnosis present

## 2011-12-03 DIAGNOSIS — E785 Hyperlipidemia, unspecified: Secondary | ICD-10-CM | POA: Diagnosis present

## 2011-12-03 DIAGNOSIS — Z09 Encounter for follow-up examination after completed treatment for conditions other than malignant neoplasm: Secondary | ICD-10-CM | POA: Diagnosis not present

## 2011-12-03 DIAGNOSIS — J438 Other emphysema: Secondary | ICD-10-CM | POA: Diagnosis not present

## 2011-12-03 DIAGNOSIS — J984 Other disorders of lung: Secondary | ICD-10-CM | POA: Diagnosis not present

## 2011-12-03 LAB — BASIC METABOLIC PANEL
BUN: 30 mg/dL — ABNORMAL HIGH (ref 6–23)
CO2: 25 mEq/L (ref 19–32)
Calcium: 9.1 mg/dL (ref 8.4–10.5)
Chloride: 103 mEq/L (ref 96–112)
Creatinine, Ser: 0.98 mg/dL (ref 0.50–1.35)
GFR calc Af Amer: >90 mL/min (ref >90)
GFR calc non Af Amer: 84 mL/min — ABNORMAL LOW (ref >90)
Glucose, Bld: 208 mg/dL — ABNORMAL HIGH (ref 70–99)
Potassium: 4.6 mEq/L (ref 3.5–5.1)
Sodium: 138 mEq/L (ref 135–145)

## 2011-12-03 LAB — CBC
HCT: 36 % — ABNORMAL LOW (ref 39.0–52.0)
HCT: 38.4 % — ABNORMAL LOW (ref 39.0–52.0)
Hemoglobin: 12.7 g/dL — ABNORMAL LOW (ref 13.0–17.0)
Hemoglobin: 13.4 g/dL (ref 13.0–17.0)
MCH: 32.7 pg (ref 26.0–34.0)
MCH: 32.5 pg (ref 26.0–34.0)
MCHC: 35.3 g/dL (ref 30.0–36.0)
MCHC: 34.9 g/dL (ref 30.0–36.0)
MCV: 92.8 fL (ref 78.0–100.0)
MCV: 93.2 fL (ref 78.0–100.0)
Platelets: 182 10*3/uL (ref 150–400)
Platelets: 192 10*3/uL (ref 150–400)
RBC: 3.88 MIL/uL — ABNORMAL LOW (ref 4.22–5.81)
RBC: 4.12 MIL/uL — ABNORMAL LOW (ref 4.22–5.81)
RDW: 12.8 % (ref 11.5–15.5)
RDW: 12.9 % (ref 11.5–15.5)
WBC: 9.8 10*3/uL (ref 4.0–10.5)
WBC: 11.1 10*3/uL — ABNORMAL HIGH (ref 4.0–10.5)

## 2011-12-03 LAB — GLUCOSE, CAPILLARY
Glucose-Capillary: 210 mg/dL — ABNORMAL HIGH (ref 70–99)
Glucose-Capillary: 134 mg/dL — ABNORMAL HIGH (ref 70–99)

## 2011-12-03 LAB — CREATININE, SERUM
Creatinine, Ser: 0.91 mg/dL (ref 0.50–1.35)
GFR calc Af Amer: >90 mL/min (ref >90)
GFR calc non Af Amer: 87 mL/min — ABNORMAL LOW (ref >90)

## 2011-12-03 LAB — PROTIME-INR
INR: 1.15 (ref 0.00–1.49)
Prothrombin Time: 14.9 seconds (ref 11.6–15.2)

## 2011-12-03 LAB — MAGNESIUM: Magnesium: 1.9 mg/dL (ref 1.5–2.5)

## 2011-12-03 LAB — APTT: aPTT: >200 seconds (ref 24–37)

## 2011-12-03 LAB — HEMOGLOBIN A1C
Hgb A1c MFr Bld: 7.1 % — ABNORMAL HIGH (ref <5.7)
Mean Plasma Glucose: 157 mg/dL — ABNORMAL HIGH (ref <117)

## 2011-12-03 SURGERY — INSERTION, ENDOVASCULAR STENT GRAFT, AORTA, ABDOMINAL
Anesthesia: General | Wound class: Clean

## 2011-12-03 MED ORDER — GUAIFENESIN-DM 100-10 MG/5ML PO SYRP: 15.0000 mL | ORAL_SOLUTION | ORAL | Status: DC | PRN

## 2011-12-03 MED ORDER — FENTANYL CITRATE 0.05 MG/ML IJ SOLN: 50.0000 ug | INTRAMUSCULAR | Status: DC | PRN

## 2011-12-03 MED ORDER — DOCUSATE SODIUM 100 MG PO CAPS
100.0000 mg | ORAL_CAPSULE | Freq: Every day | ORAL | Status: DC
  Administered 2011-12-04: 100 mg via ORAL
  Filled 2011-12-03: qty 1

## 2011-12-03 MED ORDER — ACETAMINOPHEN 325 MG PO TABS: 325.0000 mg | ORAL_TABLET | ORAL | Status: DC | PRN

## 2011-12-03 MED ORDER — DOPAMINE-DEXTROSE 3.2-5 MG/ML-% IV SOLN: 3.0000 ug/kg/min | INTRAVENOUS | Status: DC

## 2011-12-03 MED ORDER — FENTANYL CITRATE 0.05 MG/ML IJ SOLN
INTRAMUSCULAR | Status: DC | PRN
  Administered 2011-12-03: 100 ug via INTRAVENOUS

## 2011-12-03 MED ORDER — ACETAMINOPHEN 650 MG RE SUPP: 325.0000 mg | RECTAL | Status: DC | PRN

## 2011-12-03 MED ORDER — CARVEDILOL 3.125 MG PO TABS
3.1250 mg | ORAL_TABLET | Freq: Two times a day (BID) | ORAL | Status: DC
  Administered 2011-12-03 – 2011-12-04 (×2): 3.125 mg via ORAL
  Filled 2011-12-03 (×3): qty 1

## 2011-12-03 MED ORDER — ROCURONIUM BROMIDE 100 MG/10ML IV SOLN
INTRAVENOUS | Status: DC | PRN
  Administered 2011-12-03: 10 mg via INTRAVENOUS
  Administered 2011-12-03: 50 mg via INTRAVENOUS
  Administered 2011-12-03 (×3): 10 mg via INTRAVENOUS

## 2011-12-03 MED ORDER — METOPROLOL TARTRATE 1 MG/ML IV SOLN: 2.0000 mg | INTRAVENOUS | Status: DC | PRN

## 2011-12-03 MED ORDER — LABETALOL HCL 5 MG/ML IV SOLN: 10.0000 mg | INTRAVENOUS | Status: DC | PRN

## 2011-12-03 MED ORDER — PROMETHAZINE HCL 25 MG RE SUPP
25.0000 mg | Freq: Once | RECTAL | Status: DC | PRN
  Filled 2011-12-03: qty 1

## 2011-12-03 MED ORDER — PHENOL 1.4 % MT LIQD: 1.0000 | OROMUCOSAL | Status: DC | PRN

## 2011-12-03 MED ORDER — SODIUM CHLORIDE 0.9 % IV SOLN: 500.0000 mL | Freq: Once | INTRAVENOUS | Status: AC | PRN

## 2011-12-03 MED ORDER — SODIUM CHLORIDE 0.9 % IV SOLN: 500.0000 mL | Freq: Once | INTRAVENOUS | Status: DC | PRN

## 2011-12-03 MED ORDER — HYDROMORPHONE HCL PF 1 MG/ML IJ SOLN: 0.2500 mg | INTRAMUSCULAR | Status: DC | PRN

## 2011-12-03 MED ORDER — HYDRALAZINE HCL 20 MG/ML IJ SOLN: 10.0000 mg | INTRAMUSCULAR | Status: DC | PRN

## 2011-12-03 MED ORDER — PANTOPRAZOLE SODIUM 40 MG PO TBEC: 40.0000 mg | DELAYED_RELEASE_TABLET | Freq: Every day | ORAL | Status: DC

## 2011-12-03 MED ORDER — OXYCODONE-ACETAMINOPHEN 5-325 MG PO TABS: 1.0000 | ORAL_TABLET | ORAL | Status: AC | PRN

## 2011-12-03 MED ORDER — SIMVASTATIN 40 MG PO TABS
40.0000 mg | ORAL_TABLET | Freq: Every day | ORAL | Status: DC
  Administered 2011-12-03: 40 mg via ORAL
  Filled 2011-12-03 (×2): qty 1

## 2011-12-03 MED ORDER — POTASSIUM CHLORIDE CRYS ER 20 MEQ PO TBCR: 20.0000 meq | EXTENDED_RELEASE_TABLET | Freq: Once | ORAL | Status: DC | PRN

## 2011-12-03 MED ORDER — POTASSIUM CHLORIDE CRYS ER 20 MEQ PO TBCR: 20.0000 meq | EXTENDED_RELEASE_TABLET | Freq: Once | ORAL | Status: AC | PRN

## 2011-12-03 MED ORDER — PHENYLEPHRINE HCL 10 MG/ML IJ SOLN
INTRAMUSCULAR | Status: DC | PRN
  Administered 2011-12-03 (×2): 80 ug via INTRAVENOUS
  Administered 2011-12-03 (×2): 120 ug via INTRAVENOUS
  Administered 2011-12-03: 80 ug via INTRAVENOUS
  Administered 2011-12-03: 40 ug via INTRAVENOUS

## 2011-12-03 MED ORDER — DEXTROSE-NACL 5-0.9 % IV SOLN
INTRAVENOUS | Status: DC
  Administered 2011-12-03 (×2): via INTRAVENOUS

## 2011-12-03 MED ORDER — LISINOPRIL 20 MG PO TABS
20.0000 mg | ORAL_TABLET | Freq: Two times a day (BID) | ORAL | Status: DC
  Administered 2011-12-03 – 2011-12-04 (×2): 20 mg via ORAL
  Filled 2011-12-03 (×3): qty 1

## 2011-12-03 MED ORDER — DEXTROSE 5 % IV SOLN
1.5000 g | Freq: Two times a day (BID) | INTRAVENOUS | Status: AC
  Administered 2011-12-03 – 2011-12-04 (×2): 1.5 g via INTRAVENOUS
  Filled 2011-12-03 (×2): qty 1.5

## 2011-12-03 MED ORDER — LACTATED RINGERS IV SOLN
INTRAVENOUS | Status: DC | PRN
  Administered 2011-12-03: 10:00:00 via INTRAVENOUS

## 2011-12-03 MED ORDER — MORPHINE SULFATE 2 MG/ML IJ SOLN: 2.0000 mg | INTRAMUSCULAR | Status: DC | PRN

## 2011-12-03 MED ORDER — ENOXAPARIN SODIUM 40 MG/0.4ML ~~LOC~~ SOLN
40.0000 mg | SUBCUTANEOUS | Status: DC
  Administered 2011-12-04: 40 mg via SUBCUTANEOUS
  Filled 2011-12-03 (×2): qty 0.4

## 2011-12-03 MED ORDER — PROPOFOL 10 MG/ML IV EMUL
INTRAVENOUS | Status: DC | PRN
  Administered 2011-12-03: 160 mg via INTRAVENOUS

## 2011-12-03 MED ORDER — ACETAMINOPHEN 650 MG RE SUPP
325.0000 mg | RECTAL | Status: DC | PRN
Start: 2011-12-03 — End: 2011-12-04

## 2011-12-03 MED ORDER — DOCUSATE SODIUM 100 MG PO CAPS: 100.0000 mg | ORAL_CAPSULE | Freq: Every day | ORAL | Status: DC

## 2011-12-03 MED ORDER — SODIUM CHLORIDE 0.9 % IR SOLN
Status: DC | PRN
  Administered 2011-12-03: 11:00:00

## 2011-12-03 MED ORDER — ONDANSETRON HCL 4 MG/2ML IJ SOLN: 4.0000 mg | Freq: Four times a day (QID) | INTRAMUSCULAR | Status: DC | PRN

## 2011-12-03 MED ORDER — ASPIRIN 325 MG PO TABS
325.0000 mg | ORAL_TABLET | Freq: Every day | ORAL | Status: DC
  Administered 2011-12-04: 325 mg via ORAL
  Filled 2011-12-03: qty 1

## 2011-12-03 MED ORDER — GLYCOPYRROLATE 0.2 MG/ML IJ SOLN
INTRAMUSCULAR | Status: DC | PRN
  Administered 2011-12-03: 0.4 mg via INTRAVENOUS

## 2011-12-03 MED ORDER — EPHEDRINE SULFATE 50 MG/ML IJ SOLN
INTRAMUSCULAR | Status: DC | PRN
  Administered 2011-12-03 (×3): 10 mg via INTRAVENOUS

## 2011-12-03 MED ORDER — DEXTROSE 5 % IV SOLN
1.5000 g | Freq: Two times a day (BID) | INTRAVENOUS | Status: DC
  Filled 2011-12-03 (×2): qty 1.5

## 2011-12-03 MED ORDER — ALUM & MAG HYDROXIDE-SIMETH 200-200-20 MG/5ML PO SUSP: 15.0000 mL | ORAL | Status: DC | PRN

## 2011-12-03 MED ORDER — ONDANSETRON HCL 4 MG/2ML IJ SOLN
INTRAMUSCULAR | Status: DC | PRN
  Administered 2011-12-03: 4 mg via INTRAVENOUS

## 2011-12-03 MED ORDER — INSULIN ASPART 100 UNIT/ML ~~LOC~~ SOLN
0.0000 [IU] | Freq: Three times a day (TID) | SUBCUTANEOUS | Status: DC
  Administered 2011-12-03: 2 [IU] via SUBCUTANEOUS
  Administered 2011-12-04: 3 [IU] via SUBCUTANEOUS

## 2011-12-03 MED ORDER — OXYCODONE-ACETAMINOPHEN 5-325 MG PO TABS: 1.0000 | ORAL_TABLET | ORAL | Status: DC | PRN

## 2011-12-03 MED ORDER — MIDAZOLAM HCL 5 MG/5ML IJ SOLN
INTRAMUSCULAR | Status: DC | PRN
  Administered 2011-12-03: 2 mg via INTRAVENOUS

## 2011-12-03 MED ORDER — LIDOCAINE HCL (CARDIAC) 20 MG/ML IV SOLN
INTRAVENOUS | Status: DC | PRN
  Administered 2011-12-03: 80 mg via INTRAVENOUS

## 2011-12-03 MED ORDER — 0.9 % SODIUM CHLORIDE (POUR BTL) OPTIME
TOPICAL | Status: DC | PRN
  Administered 2011-12-03: 1000 mL

## 2011-12-03 MED ORDER — NEOSTIGMINE METHYLSULFATE 1 MG/ML IJ SOLN
INTRAMUSCULAR | Status: DC | PRN
  Administered 2011-12-03: 3 mg via INTRAVENOUS

## 2011-12-03 MED ORDER — IODIXANOL 320 MG/ML IV SOLN
INTRAVENOUS | Status: DC | PRN
  Administered 2011-12-03: 150 mL via INTRA_ARTERIAL

## 2011-12-03 MED ORDER — MIDAZOLAM HCL 2 MG/2ML IJ SOLN: 1.0000 mg | INTRAMUSCULAR | Status: DC | PRN

## 2011-12-03 MED ORDER — MAGNESIUM SULFATE 40 MG/ML IJ SOLN
2.0000 g | Freq: Once | INTRAMUSCULAR | Status: AC | PRN
  Filled 2011-12-03: qty 50

## 2011-12-03 MED ORDER — MAGNESIUM SULFATE 40 MG/ML IJ SOLN
2.0000 g | Freq: Once | INTRAMUSCULAR | Status: DC | PRN
  Filled 2011-12-03: qty 50

## 2011-12-03 MED ORDER — HEPARIN SODIUM (PORCINE) 1000 UNIT/ML IJ SOLN
INTRAMUSCULAR | Status: DC | PRN
  Administered 2011-12-03: 7500 [IU] via INTRAVENOUS

## 2011-12-03 SURGICAL SUPPLY — 82 items
ADH SKN CLS APL DERMABOND .7 (GAUZE/BANDAGES/DRESSINGS) ×2
BAG DECANTER FOR FLEXI CONT (MISCELLANEOUS) IMPLANT
BAG SNAP BAND KOVER 36X36 (MISCELLANEOUS) ×2 IMPLANT
BALLN CODA OCL 2-9.0-35-120-3 (BALLOONS)
BALLOON COD OCL 2-9.0-35-120-3 (BALLOONS) IMPLANT
CANISTER SUCTION 2500CC (MISCELLANEOUS) ×2 IMPLANT
CATH BEACON 5.038 65CM KMP-01 (CATHETERS) ×1 IMPLANT
CATH OMNI FLUSH .035X70CM (CATHETERS) ×1 IMPLANT
CLIP TI MEDIUM 24 (CLIP) IMPLANT
CLIP TI WIDE RED SMALL 24 (CLIP) IMPLANT
CLOTH BEACON ORANGE TIMEOUT ST (SAFETY) ×2 IMPLANT
COVER MAYO STAND STRL (DRAPES) ×2 IMPLANT
COVER PROBE W GEL 5X96 (DRAPES) ×1 IMPLANT
COVER SURGICAL LIGHT HANDLE (MISCELLANEOUS) ×2 IMPLANT
DERMABOND ADVANCED (GAUZE/BANDAGES/DRESSINGS) ×2
DERMABOND ADVANCED .7 DNX12 (GAUZE/BANDAGES/DRESSINGS) ×1 IMPLANT
DEVICE CLOSURE PERCLS PRGLD 6F (VASCULAR PRODUCTS) IMPLANT
DRAIN CHANNEL 10F 3/8 F FF (DRAIN) IMPLANT
DRAIN CHANNEL 10M FLAT 3/4 FLT (DRAIN) IMPLANT
DRAPE TABLE COVER HEAVY DUTY (DRAPES) ×2 IMPLANT
DRESSING OPSITE X SMALL 2X3 (GAUZE/BANDAGES/DRESSINGS) ×2 IMPLANT
DRYSEAL FLEXSHEATH 12FR 33CM (SHEATH) ×1
DRYSEAL FLEXSHEATH 18FR 33CM (SHEATH) ×1
ELECT CAUTERY BLADE 6.4 (BLADE) ×2 IMPLANT
ELECT REM PT RETURN 9FT ADLT (ELECTROSURGICAL) ×4
ELECTRODE REM PT RTRN 9FT ADLT (ELECTROSURGICAL) ×2 IMPLANT
EVACUATOR 3/16  PVC DRAIN (DRAIN)
EVACUATOR 3/16 PVC DRAIN (DRAIN) IMPLANT
EVACUATOR SILICONE 100CC (DRAIN) IMPLANT
GAUZE SPONGE 2X2 8PLY STRL LF (GAUZE/BANDAGES/DRESSINGS) IMPLANT
GLOVE BIO SURGEON STRL SZ 6 (GLOVE) ×1 IMPLANT
GLOVE BIO SURGEON STRL SZ7 (GLOVE) ×1 IMPLANT
GLOVE BIO SURGEON STRL SZ7.5 (GLOVE) ×2 IMPLANT
GLOVE BIOGEL PI IND STRL 7.5 (GLOVE) ×1 IMPLANT
GLOVE BIOGEL PI INDICATOR 7.5 (GLOVE) ×2
GLOVE SS BIOGEL STRL SZ 6.5 (GLOVE) IMPLANT
GLOVE SUPERSENSE BIOGEL SZ 6.5 (GLOVE) ×2
GLOVE SURG SS PI 7.0 STRL IVOR (GLOVE) ×1 IMPLANT
GOWN STRL NON-REIN LRG LVL3 (GOWN DISPOSABLE) ×7 IMPLANT
GOWN STRL REIN XL XLG (GOWN DISPOSABLE) ×1 IMPLANT
GRAFT BALLN CATH 65CM (STENTS) IMPLANT
GRAFT ENDOPROSETHESIS 26/12/16 (Endovascular Graft) ×1 IMPLANT
GRAFT EXCLUDER LEG 12X10 (Endovascular Graft) ×1 IMPLANT
GRAFT EXCLUDER LEG 16X10 (Endovascular Graft) ×1 IMPLANT
GUIDEWIRE ANGLED .035X150CM (WIRE) ×1 IMPLANT
KIT BASIN OR (CUSTOM PROCEDURE TRAY) ×2 IMPLANT
KIT LAP CBDE (SET/KITS/TRAYS/PACK) ×1 IMPLANT
KIT ROOM TURNOVER OR (KITS) ×2 IMPLANT
NAMIC PROTECTION STATION ×1 IMPLANT
NDL PERC 18GX7CM (NEEDLE) ×1 IMPLANT
NEEDLE PERC 18GX7CM (NEEDLE) ×2 IMPLANT
NS IRRIG 1000ML POUR BTL (IV SOLUTION) ×2 IMPLANT
PACK AORTA (CUSTOM PROCEDURE TRAY) ×2 IMPLANT
PAD ARMBOARD 7.5X6 YLW CONV (MISCELLANEOUS) ×4 IMPLANT
PENCIL BUTTON HOLSTER BLD 10FT (ELECTRODE) IMPLANT
PERCLOSE PROGLIDE 6F (VASCULAR PRODUCTS) ×8
SHEATH AVANTI 11CM 5FR (MISCELLANEOUS) ×1 IMPLANT
SHEATH AVANTI 11CM 8FR (MISCELLANEOUS) ×2 IMPLANT
SHEATH BRITE TIP 8FR 23CM (MISCELLANEOUS) ×1 IMPLANT
SHEATH DRYSEAL FLEX 12FR 33CM (SHEATH) IMPLANT
SHEATH DRYSEAL FLEX 18FR 33CM (SHEATH) IMPLANT
SNAP KAP ×1 IMPLANT
SPONGE GAUZE 2X2 STER 10/PKG (GAUZE/BANDAGES/DRESSINGS) ×2
STAPLER VISISTAT 35W (STAPLE) IMPLANT
STENT GRAFT BALLN CATH 65CM (STENTS) ×1
STOPCOCK MORSE 400PSI 3WAY (MISCELLANEOUS) ×2 IMPLANT
SUT PROLENE 5 0 C 1 24 (SUTURE) IMPLANT
SUT VIC AB 2-0 CTB1 (SUTURE) IMPLANT
SUT VIC AB 3-0 SH 27 (SUTURE)
SUT VIC AB 3-0 SH 27X BRD (SUTURE) IMPLANT
SUT VICRYL 4-0 PS2 18IN ABS (SUTURE) ×4 IMPLANT
SYR 20CC LL (SYRINGE) ×4 IMPLANT
SYR 30ML LL (SYRINGE) IMPLANT
SYR MEDRAD MARK V 150ML (SYRINGE) ×2 IMPLANT
SYRINGE 10CC LL (SYRINGE) ×6 IMPLANT
TOWEL OR 17X24 6PK STRL BLUE (TOWEL DISPOSABLE) ×4 IMPLANT
TOWEL OR 17X26 10 PK STRL BLUE (TOWEL DISPOSABLE) ×2 IMPLANT
TRAY FOLEY CATH 14FRSI W/METER (CATHETERS) ×2 IMPLANT
TUBING HIGH PRESSURE 120CM (CONNECTOR) ×3 IMPLANT
WATER STERILE IRR 1000ML POUR (IV SOLUTION) ×1 IMPLANT
WIRE AMPLATZ SS-J .035X180CM (WIRE) ×2 IMPLANT
WIRE BENTSON .035X145CM (WIRE) ×3 IMPLANT

## 2011-12-03 NOTE — Progress Notes (Signed)
Pt CBG 255 at 2200. Dr. Darrick Penna aware and no new orders received. Will recheck CBG in am as ordered. Will continue to monitor.

## 2011-12-03 NOTE — Op Note (Signed)
NAME: Luke [name]   MRN: [id number] DOB: 1945-06-29    DATE OF OPERATION: 12/03/2011  PREOP DIAGNOSIS: 6 cm infrarenal abdominal aortic aneurysm  POSTOP DIAGNOSIS: same  PROCEDURE: EVAR  (Gore Excluder) using 3 components  COSURGEON: Di Kindle. Edilia Bo, MD, Leonides Sake, MD  ANESTHESIA: Gen.   EBL: 100 cc  INDICATIONS: Luke [name] is a 66 y.o. male with a 6 cm infrarenal abdominal aortic aneurysm. He was brought in for elective repair given the risk of rupture. He was evaluated preoperatively by Dr. Swaziland and does have known coronary artery disease.  FINDINGS: At the completion there was no evidence of type I or type II endoleak.  TECHNIQUE: The patient was brought to the operating room after an arterial line and IVs were placed by anesthesia. He received a general anesthetic. The abdomen and both groins were prepped and draped in the usual sterile fashion. The ultrasound-guided access  2 the left common femoral arteryand  introduction of the 2 contralateral components are dictated by Dr. Imogene Burn.  On the right side, under ultrasound guidance the right common femoral artery was reclosed with 2 Perclose devices. Initial device was rotated 30 medially. The second device was rotated 30 laterally. The 8 French sheath was introduced after the artery was preclosed. On the left side, the artery had been preclosed with 2 Perclose devices in a similar fashion. An 8 French sheath was placed on the left side. A Benson wire was passed through the left common iliac artery stenosis and then the Kumpe catheter advanced over the wire into the infrarenal aorta. The [facility] wire was exchanged for an Amplatz wire. The patient received 7500 units of heparin IV. The 12 French sheath was introduced over the Amplatz wire on the left without difficulty. On the right side the [facility] wire was exchanged for an Amplatz wire through a Kumpe catheter. The 18 French sheath was advanced over the Amplatz wire in position  at the L1 vertebral body. Pigtail catheter had been placed at the left side. The trunk ipsilateral component which was 26 mm x 12 minute millimeters by 16 cm in length was advanced through the 18 French sheath in the right groin with the contralateral gate positioned to the patient's right. The sheath was then retracted on the right. An arteriogram was obtained through the pigtail catheter from the left side demonstrating the position of the renal arteries. The trunk ipsilateral proximal component was deployed in follow up films showed excellent position of the proximal graft. Next the contralateral gate was cannulated as dictated by Dr. Imogene Burn. Once position of the catheter within the device was confirmed to be sure that we were within the device the Amplatz wire was placed and then the initial contralateral leg was positioned overlapping the trunk ipsilateral component 3 cm. The initial piece was 12 mm x 10 cm in length. A second component (10 mm x 7 cm) was overlapped within the contralateral leg 3 cm thus preserving flow to the left hypogastric artery. Next the ipsilateral limb was fully deployed. All of the overlap zones of the device in the proximal attachment site were ballooned. Completion film showed excellent positioning of the graft with no type I or type II endo-leaks noted. The heparin was not reversed with protamine.  Next, on the right side, the sheath was removed over a wire and the Perclose devices were secured with good hemostasis noted. The wire was then removed and then the sutures tightened further and secured. Similarly, on the  left side the sheath was removed and the Perclose device is secured and then the wire removed. Again there was good hemostasis. After the sutures were cut the skin on each side was closed with 4-0 subcuticular stitch. Dermabond was applied.  The feet at the completion of the procedure were warm and well-perfused. The patient tolerated the procedure well and was  transferred to the recovery room in stable condition. All needle and sponge counts were correct.  Waverly Ferrari, MD, FACS [facility]  DATE OF DICTATION:   12/03/2011

## 2011-12-03 NOTE — Progress Notes (Signed)
Antibiotic dosing has been reviewed; no renal dose adjustment needed. Pharmacy signing off.  Lillia Pauls, PharmD Clinical Pharmacist Pager: [phone] Phone: [phone] 12/03/2011 3:55 PM

## 2011-12-03 NOTE — Plan of Care (Signed)
Problem: Phase I Progression Outcomes Goal: Vascular site scale level 0 - I Vascular Site Scale Level 0: No bruising/bleeding/hematoma Level I (Mild): Bruising/Ecchymosis, minimal bleeding/ooozing, palpable hematoma < 3 cm Level II (Moderate): Bleeding not affecting hemodynamic parameters, pseudoaneurysm, palpable hematoma > 3 cm Outcome: Completed/Met Date Met:  12/03/11 Level 0

## 2011-12-03 NOTE — Transfer of Care (Signed)
Immediate Anesthesia Transfer of Care Note  Patient: Luke [name]  Procedure(s) Performed: Procedure(s) (LRB): ABDOMINAL AORTIC ENDOVASCULAR STENT GRAFT (N/A)  Patient Location: PACU  Anesthesia Type: General  Level of Consciousness: awake, alert , oriented and patient cooperative  Airway & Oxygen Therapy: Patient Spontanous Breathing and Patient connected to nasal cannula oxygen  Post-op Assessment: Report given to PACU RN, Post -op Vital signs reviewed and stable and Patient moving all extremities  Post vital signs: Reviewed and stable  Complications: No apparent anesthesia complications

## 2011-12-03 NOTE — Plan of Care (Signed)
Problem: Consults Goal: Diagnosis CEA/CES/AAA Stent Abdominal Aortic Aneurysm Stent (AAA)     

## 2011-12-03 NOTE — H&P (View-Only) (Signed)
[facility] [place]  Patient name: Luke [name] MRN: [id number] DOB: 10/01/1945 Sex: male  REASON FOR ADMISSION: 6 cm infrarenal abdominal aortic aneurysm.  HPI: Luke [name] is a 66 y.o. male who I have been following with an abdominal aortic aneurysm. His aneurysm enlarged from 5.4 cm 6 months prior and is now 6 cm. He has had no significant abdominal pain or back pain. He's had a CT scan and arteriogram which demonstrates some calcific disease especially in the left common iliac artery. However he is felt to be a reasonable candidate for EVAR. He does have a history of coronary artery disease and underwent cardiac catheterization by Dr. Peter Jordan. This showed moderate to severe two-vessel coronary artery disease with severe LV dysfunction. His overall ejection fraction was 30-35%. I have discussed the case with Dr. Katz and we felt that endovascular repair would be best given his cardiac situation. He now presents for elective repair.  Past Medical History  Diagnosis Date  . Diabetes mellitus     type II  . PAD (peripheral artery disease)   . AAA (abdominal aortic aneurysm) 2011    Per vascular surgery  . HLD (hyperlipidemia)   . Hypertension     white coat HTN-- often elevated in office and controlled on outside checks.  . Low testosterone     Hx of  . CAD (coronary artery disease)     Presumed CAD with nuclear scan October 09, 2011,  large anteroseptal MI and inferior MI. Catheterization scheduled October 15, 2011  . Cardiomyopathy     Nuclear, October 09, 2011, EF 30%, multiple focal wall motion abnormalities  . Ejection fraction < 50%     EF 30%, nuclear, October 09, 2011  . LBBB (left bundle branch block)     LBBB on EKG October 11, 2011,  no prior EKG has been done    Family History  Problem Relation Age of Onset  . Hypertension Mother   . Diabetes Sister   . Heart disease Sister   . Hypertension Sister     SOCIAL HISTORY: History  Substance Use Topics    . Smoking status: Former Smoker    Types: Cigarettes    Quit date: 05/06/2004  . Smokeless tobacco: Never Used   Comment: quit about 7 years ago  . Alcohol Use: 1.2 oz/week    2 Cans of beer per week     beer occassionally    Allergies  Allergen Reactions  . Codeine Hives    Current Outpatient Prescriptions  Medication Sig Dispense Refill  . ACCU-CHEK AVIVA PLUS test strip CHECK BLOOD SUGAR three times a day 2 hours after meals  100 each  PRN  . aspirin 325 MG tablet Take 325 mg by mouth daily.      . carvedilol (COREG) 3.125 MG tablet Take 1 tablet (3.125 mg total) by mouth 2 (two) times daily.  180 tablet  3  . lisinopril (PRINIVIL,ZESTRIL) 20 MG tablet Take 20 mg by mouth 2 (two) times daily.      . metFORMIN (GLUCOPHAGE) 500 MG tablet Take 1,000 mg by mouth 2 (two) times daily with a meal.      . Multiple Vitamin (MULTIVITAMIN) tablet Take 1 tablet by mouth daily.        . sildenafil (VIAGRA) 50 MG tablet Take 50 mg by mouth daily as needed. For sexual activity      . simvastatin (ZOCOR) 40 MG tablet Take 40 mg by mouth   at bedtime.        REVIEW OF SYSTEMS: [X ] denotes positive finding; [  ] denotes negative finding CARDIOVASCULAR:  [ ] chest pain   [ ] chest pressure   [ ] palpitations   [ ] orthopnea   [ ] dyspnea on exertion   [X ] claudication- bilateral   [ ] rest pain   [ ] DVT   [ ] phlebitis PULMONARY:   [ ] productive cough   [ ] asthma   [ ] wheezing NEUROLOGIC:   [ ] weakness  [ ] paresthesias  [ ] aphasia  [ ] amaurosis  [ ] dizziness HEMATOLOGIC:   [ ] bleeding problems   [ ] clotting disorders MUSCULOSKELETAL:  [ ] joint pain   [ ] joint swelling [ ] leg swelling GASTROINTESTINAL: [ ]  blood in stool  [ ]  hematemesis GENITOURINARY:  [ ]  dysuria  [ ]  hematuria PSYCHIATRIC:  [ ] history of major depression INTEGUMENTARY:  [ ] rashes  [ ] ulcers CONSTITUTIONAL:  [ ] fever   [ ] chills  PHYSICAL EXAM: Filed Vitals:   11/13/11 1427  BP: 194/97  Pulse: 92   Resp: 18  Height: 6' 3" (1.905 m)  Weight: 170 lb (77.111 kg)  SpO2: 100%   Body mass index is 21.25 kg/(m^2). GENERAL: The patient is a well-nourished male, in no acute distress. The vital signs are documented above. CARDIOVASCULAR: There is a regular rate and rhythm without significant murmur appreciated. I do not detect carotid bruits. He has palpable femoral pulses although the left femoral pulse is slightly diminished. I cannot palpate pedal pulses. He has no significant lower extremity swelling. PULMONARY: There is good air exchange bilaterally without wheezing or rales. ABDOMEN: Soft and non-tender with normal pitched bowel sounds. His aneurysm is easily palpable and nontender. MUSCULOSKELETAL: There are no major deformities or cyanosis. NEUROLOGIC: No focal weakness or paresthesias are detected. SKIN: There are no ulcers or rashes noted. PSYCHIATRIC: The patient has a normal affect.  DATA:  I have reviewed his heart cath results as described above. He has two-vessel coronary artery disease.  Arteriogram demonstrates atherosclerotic disease of the proximal left common iliac artery. In addition he has a long segment right superficial femoral artery occlusion with some moderate diffuse disease on the left side. He also has some calcific plaque in the left common femoral artery and also some calcific plaque on the right.  MEDICAL ISSUES:  AAA (abdominal aortic aneurysm) This patient has a 6 cm infrarenal abdominal aortic aneurysm. His risk of rupture is approximately 5-10% per year. For this reason I have recommended elective repair. He does have some atherosclerotic disease in his proximal left common iliac artery; however,  I think there is a good chance that we can still address his aneurysm with an endovascular stent graft. Certainly with if we were unable to get the sheath through the left common iliac artery he could potentially have to be converted to an open repair. Also, because  of the atherosclerotic disease he is at higher risk for iliac artery injury which could potentially necessitate open repair. We have discussed the advantages and disadvantages of open versus endovascular repair he would like to proceed with endovascular repair. I have discussed the potential complications of EVAR, including, but not limited to: bleeding, infection, arterial injury, graft migration, endoleak, renal failure, MI or other unpredictable medical problems. We have discussed the possibility of having to convert to   open repair. We have also discussed the potential complications of open repair including a 4-5% risk of mortality or major morbidity.We also discussed the need for continued lifelong follow-up after EVAR. All of the patients questions were answered and they are agreeable to proceed with surgery. We will try to schedule his surgery for 12/03/2011.    Tiburcio Linder S [facility] [place] Office: [phone]   

## 2011-12-03 NOTE — Progress Notes (Signed)
VASCULAR POST OP NOTE  SUBJECTIVE: No complaints  PHYSICAL EXAM: Filed Vitals:   12/03/11 1420 12/03/11 1421 12/03/11 1422 12/03/11 1507  BP:  149/71  142/70  Pulse: 62 62 61 66  Temp:    97.7 F (36.5 C)  TempSrc:    Oral  Resp: 17 16 20 16   Height:    6\' 3"  (1.905 m)  Weight:   171 lb 15.3 oz (78 kg) 167 lb 1.7 oz (75.8 kg)  SpO2: 100% 100% 100% 96%   Groins look fine Feet warm  LABS: Lab Results  Component Value Date   WBC 9.8 12/03/2011   HGB 12.7* 12/03/2011   HCT 36.0* 12/03/2011   MCV 92.8 12/03/2011   PLT 182 12/03/2011   Lab Results  Component Value Date   CREATININE 0.98 12/03/2011   Lab Results  Component Value Date   INR 1.05 11/27/2011   CBG (last 3)   Basename 12/03/11 0745  GLUCAP 210*     ASSESSMENT/PLAN: 1. Doing well post op   Waverly Ferrari, MD, FACS Beeper: [phone] 12/03/2011

## 2011-12-03 NOTE — Anesthesia Postprocedure Evaluation (Signed)
  Anesthesia Post-op Note  Patient: Luke [name]  Procedure(s) Performed: Procedure(s) (LRB): ABDOMINAL AORTIC ENDOVASCULAR STENT GRAFT (N/A)  Patient Location: PACU  Anesthesia Type: General  Level of Consciousness: awake  Airway and Oxygen Therapy: Patient Spontanous Breathing  Post-op Pain: mild  Post-op Assessment: Post-op Vital signs reviewed, Patient's Cardiovascular Status Stable, Respiratory Function Stable, Patent Airway, No signs of Nausea or vomiting and Pain level controlled  Post-op Vital Signs: stable  Complications: No apparent anesthesia complications

## 2011-12-03 NOTE — Discharge Summary (Signed)
[facility] Discharge Summary   Patient ID:  COTTRELL GENTLES MRN: [id number] DOB/AGE: 65-Sep-1947 65 y.o.  Admit date: 12/03/2011 Discharge date: 12/04/2011 Date of Surgery: 12/03/2011 Surgeon: Surgeon(s): [name], MD Fransisco Hertz, MD  Admission Diagnosis: Abdominal Aortic Aneurysm  Discharge Diagnoses:  Abdominal Aortic Aneurysm  Secondary Diagnoses: Past Medical History  Diagnosis Date  . Diabetes mellitus     type II  . PAD (peripheral artery disease)   . AAA (abdominal aortic aneurysm) 2011    Per vascular surgery  . HLD (hyperlipidemia)   . Hypertension     white coat HTN-- often elevated in office and controlled on outside checks.  . Low testosterone     Hx of  . Cardiomyopathy     Nuclear, October 09, 2011, EF 30%, multiple focal wall motion abnormalities  . Ejection fraction < 50%     EF 30%, nuclear, October 09, 2011  . LBBB (left bundle branch block)     LBBB on EKG October 11, 2011,  no prior EKG has been done  . CAD (coronary artery disease)     Presumed CAD with nuclear scan October 09, 2011,  large anteroseptal MI and inferior MI. Catheterization scheduled October 15, 2011  . Anxiety     no med. in use for anxiety but pt. speaks openly of his stress & anxious feelings regarding impending surgery      Procedure(s): ABDOMINAL AORTIC ENDOVASCULAR STENT GRAFT  Discharged Condition: good  HPI:  NAM Luke [name] is a 66 y.o. male who I have been following with an abdominal aortic aneurysm. His aneurysm enlarged from 5.4 cm 6 months prior and is now 6 cm. He has had no significant abdominal pain or back pain. He's had a CT scan and arteriogram which demonstrates some calcific disease especially in the left common iliac artery. However he is felt to be a reasonable candidate for EVAR. He does have a history of coronary artery disease and underwent cardiac catheterization by Dr. Peter Swazil[place]. This showed moderate to severe two-vessel coronary artery  disease with severe LV dysfunction. His overall ejection fraction was 30-35%. I have discussed the case with Dr. Myrtis Ser and we felt that endovascular repair would be best given his cardiac situation. He now presents for elective repair.   Hospital Course:  Luke [name] is a 66 y.o. male is S/P Procedure(s): ABDOMINAL AORTIC ENDOVASCULAR STENT GRAFT Extubated: POD # 0 Post-op wounds healing well Pt. Ambulating, voiding and taking PO diet without difficulty. Pt pain controlled with PO pain meds. Labs as below Complications:none  Consults:     Significant Diagnostic Studies: CBC Lab Results  Component Value Date   WBC 11.2* 11/27/2011   HGB 14.1 11/27/2011   HCT 39.7 11/27/2011   MCV 92.5 11/27/2011   PLT 199 11/27/2011    BMET    Component Value Date/Time   NA 137 11/27/2011 1103   K 4.7 11/27/2011 1103   CL 102 11/27/2011 1103   CO2 20 11/27/2011 1103   GLUCOSE 230* 11/27/2011 1103   BUN 27* 11/27/2011 1103   CREATININE 0.88 11/27/2011 1103   CREATININE 1.06 09/25/2011 1257   CALCIUM 9.9 11/27/2011 1103   GFRNONAA 88* 11/27/2011 1103   GFRAA >90 11/27/2011 1103   COAG Lab Results  Component Value Date   INR 1.05 11/27/2011   INR 1.0 10/11/2011     Disposition:  Discharge to :Home Discharge Orders    Future Appointments: Provider: Department: Dept Phone:  Center:   02/05/2012 8:30 AM [facility] [place] [phone] [facility]   02/11/2012 10:30 AM [name], MD [facility] [phone] [facility]     Future Orders Please Complete By Expires   Resume previous diet      Driving Restrictions      Comments:   No driving for 4 weeks   Lifting restrictions      Comments:   No lifting for 6 weeks   Call MD for:  temperature >100.5      Call MD for:  redness, tenderness, or signs of infection (pain, swelling, bleeding, redness, odor or green/yellow discharge around incision site)      Call MD for:  severe or increased pain, loss or decreased feeling  in  affected limb(s)      ABDOMINAL PROCEDURE/ANEURYSM REPAIR/AORTO-BIFEMORAL BYPASS:  Call MD for increased abdominal pain; cramping diarrhea; nausea/vomiting      Increase activity slowly      Comments:   Walk with assistance use walker or cane as needed   May shower       Scheduling Instructions:   thursday   Remove dressing in 24 hours      may wash over wound with mild soap and water         Raven, Harmes  Home Medication Instructions [id number]   Printed on:12/03/11 1103  Medication Information                    Multiple Vitamin (MULTIVITAMIN) tablet Take 1 tablet by mouth daily.             sildenafil (VIAGRA) 50 MG tablet Take 50 mg by mouth daily as needed. For sexual activity           ACCU-CHEK AVIVA PLUS test strip CHECK BLOOD SUGAR three times a day 2 hours after meals           aspirin 325 MG tablet Take 325 mg by mouth daily.           carvedilol (COREG) 3.125 MG tablet Take 1 tablet (3.125 mg total) by mouth 2 (two) times daily.           metFORMIN (GLUCOPHAGE) 500 MG tablet Take 1,000 mg by mouth 2 (two) times daily with a meal.           simvastatin (ZOCOR) 40 MG tablet Take 40 mg by mouth at bedtime.           lisinopril (PRINIVIL,ZESTRIL) 40 MG tablet Take 20 mg by mouth 2 (two) times daily.            oxyCODONE-acetaminophen (ROXICET) 5-325 MG per tablet Take 1 tablet by mouth every 4 (four) hours as needed for pain.            Verbal and written Discharge instructions given to the patient. Wound care per Discharge AVS   Signed:Maureen Luke Begley PA-C 12/03/2011, 11:03 AM

## 2011-12-03 NOTE — Interval H&P Note (Signed)
History and Physical Interval Note:  12/03/2011 9:47 AM  Luke [name]  has presented today for surgery, with the diagnosis of Abdominal Aortic Aneurysm  The various methods of treatment have been discussed with the patient and family. After consideration of risks, benefits and other options for treatment, the patient has consented to: ENDOVASCULAR ABDOMINAL AORTIC REPAIR .  The patient's history has been reviewed, patient examined, no change in status, stable for surgery.  I have reviewed the patient's chart and labs.  Questions were answered to the patient's satisfaction.     DICKSON,CHRISTOPHER S

## 2011-12-03 NOTE — Anesthesia Preprocedure Evaluation (Signed)
Anesthesia Evaluation  Patient identified by MRN, date of birth, ID band Patient awake    Reviewed: Allergy & Precautions, H&P , NPO status , Patient's Chart, lab work & pertinent test results  Airway Mallampati: I TM Distance: >3 FB Neck ROM: Full    Dental   Pulmonary    Pulmonary exam normal       Cardiovascular hypertension, + CAD  EKG-LBBB   Neuro/Psych Anxiety    GI/Hepatic   Endo/Other    Renal/GU      Musculoskeletal   Abdominal   Peds  Hematology   Anesthesia Other Findings   Reproductive/Obstetrics                           Anesthesia Physical Anesthesia Plan  ASA: III  Anesthesia Plan: General   Post-op Pain Management:    Induction: Intravenous  Airway Management Planned: Oral ETT  Additional Equipment: Arterial line  Intra-op Plan:   Post-operative Plan: Extubation in OR  Informed Consent: I have reviewed the patients History and Physical, chart, labs and discussed the procedure including the risks, benefits and alternatives for the proposed anesthesia with the patient or authorized representative who has indicated his/her understanding and acceptance.     Plan Discussed with: CRNA and Surgeon  Anesthesia Plan Comments:         Anesthesia Quick Evaluation

## 2011-12-03 NOTE — Op Note (Signed)
OPERATIVE NOTE   PROCEDURE:  1. Bilateral common femoral artery cannulation under ultrasound guidance 2. "Preclose" repair bilateral common femoral artery  3. Placement of catheter in aorta x 2 4. Aortogram 5. Repair of aorta with modular bifurcated prosthesis with one limb (26 mm x 12 mm x 16 cm) 6. Placement of left limb extension x : 12 mm x 10 cm,10 mm x 7 cm 7. Radiology S&I  PRE-OPERATIVE DIAGNOSIS: abdominal aortic aneurysm and left common iliac artery stenosis  POST-OPERATIVE DIAGNOSIS: same as above   CO-SURGEONS: Cari Caraway, MD, Leonides Sake, MD  ANESTHESIA: general   ESTIMATED BLOOD LOSS: 100 cc   FINDING(S):  1. No endoleak leaks visualized 2. Bilateral renal arteries widely patent at end of case 3. Successful exclusion of abdominal aortic aneurysm without any obvious iliac arterial rupture 4. Dopplerable bilateral pedal signals  INDICATIONS:  Luke [name] is a 66 y.o. male with large abdominal aortic aneurysm and left common iliac artery stenosis. The patient is aware the risks of aortic surgery include but are not limited to: bleeding, need for transfusion, infection, death, stroke, paralysis, wound complications, impotence, bowel ischemia, extended ventilation and need for secondary procedures. Overall, I cited a mortality rate of 1-2% and morbidity rate of 15%. The patient is aware that if the right common iliac artery aneurysm enlarges, he may require an additional procedure on this side to address the enlargement of that aneurysm.   DESCRIPTION:After full informed written consent was obtained from the patient, he was brought back to the operating room, amd placed supine upon the operating table. He already had a A-line place. After obtaining adequate anesthesia, a Foley catheter was placed to monitor urine output in the operating room. He was prepped and draped in the standard fashion for either open aneurysm repair or endovascular aneurysm repair. I turned my  attention to the left groin. Under ultrasound guidance, Dr. Edilia Bo identified the right common femoral artery and made a stab incision over it. I dissected down to the artery in the groin with a hemostat and then cannulated it with a 18-gauge needle. I then passed a Bentson wire up into the iliac system. Initially, a 8-French sheath was loaded over the wire and used to dilate up this tract. The first ProGlide was placed and rotated medially. The second ProGlide was placed and rotated laterally. In such fashion, Dr. Edilia Bo completed the pre-close technique on this side and then replaced the Bentson wire up into the aorta., then using a Kumpfe catheter, he  was able to get up into the aorta and exchange the wire out for a Amplatz wire. At this point then the left groin was cannulated by myself in a similar fashion to my side.  The [facility] wire would not pass the left common iliac artery stenosis, so I exchanged the needle for a 5-Fr Sheath.  I then placed a Kumpfe catheter over this wire and using these two, I was able to get into the aorta.  Catheter was removed and the sheath exchanged for the 8-Fr dilator which was then removed.  The pre-close technique was then utilized on this side. A short 8-Fr sheath was placed over the wire. Using a Kumpfe catheter, I was able to get back into the aorta and make my way into the suprarenal aorta.  The wire was exchanged for an Amplatz wire.  The sheath was then exchanged for a 12-Fr Dryseal sheath, which was advanced until hubbed.  At this point, the sheath on the  right side was exchanged for a 18-French sheath. The patient was given a therapeutic bolus for the patient. Dr. Edilia Bo delivered the main body device, which was a 26-mm x 12-mm x 16-cm, which I delivered up to the level about L1. On my side, I loaded a pigtail catheter in the suprarenal position.  This was connected to the power injector circuit and a power injector aortogram was completed to identify the renal  arteries. The lowest artery was the left renal artery. Dr. Edilia Bo pulled the main body device to below this level and deployed it, taking care to take into account the angulation here.  He was able to deploy the graft exactly below the level of the left renal artery, avoiding covering it. At this point, I replaced the Amplatz wire through the pigtail catheter, leaving the wire in place to help the cannulation process.  I placed an additional Glidewire through the Dryseal sheath and loaded a Kumpfe catheter over the wire.  Using this combination, I was able to cannulate the contralateral gate which was in a cross limb configuration. I was then able to advance both of these through the graft. I exchanged the catheter for a a pigtail catheter.  The pigtail was then pulled down into the graft and rotated to demonstrate successful cannulation of the graft. I then pulled the catheter down to the flow bifurcation and then pulled the sheath back on the left side and did a retrograde injection from the sheath demonstrating the level of the internal iliac artery. Then subsequently based on the measurements, a 12-mm x 10 cm and 10 mm x 7 cm contralateral limbs were selected.  I replaced the dilator in the left sheath and advanced the sheath with dilator into the main body, up to level of flow divider.  The dilator was removed.  The 12 mm x 10 cm  contralateral limb was then placed through this sheath and then the sheath was pulled back to appropriate location. The iliac extension was then deployed with adequate overlap. I then exchanged the sheath deployment system for the 10 mm x 7 cm extension.  The limb extension was released and the stent delivery device was removed.  At this point, Dr. Edilia Bo fully deployed the main body, fully extending the partially constrained limb and also releasing the device from the delivery system. The delivery system was then removed.  We then obtained a molding balloon which was used to mold  the top, the flow divider and the ends of each iliac limb, and all overlapping segment. Additional inflation were completed by me to angioplasty the common iliac artery stenosis.  The pigtail catheter was replaced on the right side and then a completion aortogram was completed. This demonstrated no endoleaks. Subsequently, we turned our attention to repairing each groin. The left groin was repaired by cinching down the ProGlide devices in two stages. In the first stage, the wire was left in place after removing the sheath. There was absolutely no drop in pressure with removal of the sheath and then there was minimal blood loss with the wire in place. The wire was then removed from the right groin and the white tightening stitches was pulled to fully tighten the Proglide devices. All four sutures were held in place with a hemostat with tension on the skin. In a similar fashion, the right groin was repaired. There was no further significant bleeding from either groin cannulation site so the sutures were transected with the suture transection device. Each  groin was repaired with a U stitch of 4-0 Monocryl. Each foot was then tested: both had dopplerable pedals signals throughout.   SPECIMEN(S): none   COMPLICATIONS: none   CONDITION: stable  Leonides Sake, MD [facility] Office: [phone] Pager: [phone]  12/03/2011, 1:32 PM

## 2011-12-03 NOTE — Anesthesia Procedure Notes (Signed)
Procedure Name: Intubation Date/Time: 12/03/2011 10:42 AM Performed by: Jerilee Hoh Pre-anesthesia Checklist: Patient identified, Emergency Drugs available, Suction available and Patient being monitored Patient Re-evaluated:Patient Re-evaluated prior to inductionOxygen Delivery Method: Circle system utilized Preoxygenation: Pre-oxygenation with 100% oxygen Intubation Type: IV induction Ventilation: Mask ventilation without difficulty and Oral airway inserted - appropriate to patient size Laryngoscope Size: Mac and 4 Grade View: Grade I Tube type: Oral Tube size: 7.5 mm Number of attempts: 1 Airway Equipment and Method: Stylet Placement Confirmation: ETT inserted through vocal cords under direct vision,  positive ETCO2 and breath sounds checked- equal and bilateral Secured at: 23 cm Tube secured with: Tape Dental Injury: Teeth and Oropharynx as per pre-operative assessment

## 2011-12-03 NOTE — Progress Notes (Signed)
Dr.Dickson made aware of PTT lab >200!--No new orders and okay for pt to go to the floor at this time.

## 2011-12-03 NOTE — Preoperative (Signed)
Beta Blockers   Reason not to administer Beta Blockers:Not Applicable 

## 2011-12-03 NOTE — Progress Notes (Signed)
CBG HAD BEEN DOCUMENTED ON WRONG PT. THIS PATIENTS CBG AT THIS TIME IS 193

## 2011-12-04 ENCOUNTER — Telehealth: Payer: Self-pay | Admitting: Family Medicine

## 2011-12-04 LAB — GLUCOSE, CAPILLARY
Glucose-Capillary: 255 mg/dL — ABNORMAL HIGH (ref 70–99)
Glucose-Capillary: 198 mg/dL — ABNORMAL HIGH (ref 70–99)
Glucose-Capillary: 226 mg/dL — ABNORMAL HIGH (ref 70–99)

## 2011-12-04 LAB — CBC
HCT: 35.2 % — ABNORMAL LOW (ref 39.0–52.0)
Hemoglobin: 12.4 g/dL — ABNORMAL LOW (ref 13.0–17.0)
MCH: 33 pg (ref 26.0–34.0)
MCHC: 35.2 g/dL (ref 30.0–36.0)
MCV: 93.6 fL (ref 78.0–100.0)
Platelets: 175 10*3/uL (ref 150–400)
RBC: 3.76 MIL/uL — ABNORMAL LOW (ref 4.22–5.81)
RDW: 13 % (ref 11.5–15.5)
WBC: 10.5 10*3/uL (ref 4.0–10.5)

## 2011-12-04 LAB — BASIC METABOLIC PANEL
BUN: 18 mg/dL (ref 6–23)
CO2: 26 mEq/L (ref 19–32)
Calcium: 8.9 mg/dL (ref 8.4–10.5)
Chloride: 103 mEq/L (ref 96–112)
Creatinine, Ser: 0.85 mg/dL (ref 0.50–1.35)
GFR calc Af Amer: >90 mL/min (ref >90)
GFR calc non Af Amer: 89 mL/min — ABNORMAL LOW (ref >90)
Glucose, Bld: 204 mg/dL — ABNORMAL HIGH (ref 70–99)
Potassium: 4.2 mEq/L (ref 3.5–5.1)
Sodium: 139 mEq/L (ref 135–145)

## 2011-12-04 NOTE — Progress Notes (Signed)
Utilization review completed.  

## 2011-12-04 NOTE — Telephone Encounter (Signed)
I called to check on patient.  He was discharged.  Wife answered the call.  At home, doing well.  He was asleep.  I left a message that I wished him well.

## 2011-12-04 NOTE — Progress Notes (Signed)
Patient d/c'd home per MD order.  Patient and wife given d/c instructions/prescriptions and all questions answered. Assessment WNL and VVS. Patient d/c'd via wheelchair with volunteer and family.

## 2011-12-04 NOTE — Discharge Summary (Signed)
Agree with plans for discharge today.  Di Kindle. Edilia Bo, MD, FACS Beeper [phone] 12/04/2011

## 2011-12-04 NOTE — Progress Notes (Addendum)
[facility]  Post-op EVAR Date of Surgery: 12/03/2011 Surgeon: Surgeon(s): [name], MD Fransisco Hertz, MD POD: 1 Day Post-Op Device:   History of Present Illness  Luke [name] is a 66 y.o. male who is s/p EVAR. The patient denies back pain; denies abdominal pain; denies lower extremity pain.  He is Ambulating and taking PO well without nausea or vomiting. Pt. has voided with foley out  IMAGING: Dg Chest Portable 1 View  12/03/2011  *RADIOLOGY REPORT*  Clinical Data: Postop graft placement.  PORTABLE CHEST - 1 VIEW  Comparison: 11/27/2011.  Findings: Trachea is midline.  Heart size normal.  Lungs are emphysematous.  Mild accentuation of the pulmonary markings may be due to lower lung volumes and semi upright technique. A nodular density is seen in the periphery of the right mid lung zone.  No pleural fluid.  IMPRESSION:  1.  Emphysema.  No definite acute findings. 2.  Nodular density in the peripheral right midlung zone. Continued attention on follow-up exams is warranted.  Original Report Authenticated By: Reyes Ivan, M.D.   Dg Abd Portable 1v  12/03/2011  *RADIOLOGY REPORT*  Clinical Data: Post stent graft placement.  PORTABLE ABDOMEN - 1 VIEW  Comparison: None.  Findings: Aortobi-iliac endovascular stent graft is seen in the lower abdominal midline.  A fair amount of stool is seen in the colon.  Bowel gas pattern is otherwise unremarkable.  IMPRESSION:  1.  Aortobi-iliac endovascular stent graft placement without complicating feature. 2.  Constipation.  Original Report Authenticated By: Reyes Ivan, M.D.    Significant Diagnostic Studies: CBC Lab Results  Component Value Date   WBC 10.5 12/04/2011   HGB 12.4* 12/04/2011   HCT 35.2* 12/04/2011   MCV 93.6 12/04/2011   PLT 175 12/04/2011     BMET    Component Value Date/Time   NA 139 12/04/2011 0525   K 4.2 12/04/2011 0525   CL 103 12/04/2011 0525   CO2 26 12/04/2011 0525   GLUCOSE 204* 12/04/2011 0525   BUN 18 12/04/2011 0525   CREATININE 0.85 12/04/2011 0525   CREATININE 1.06 09/25/2011 1257   CALCIUM 8.9 12/04/2011 0525   GFRNONAA 89* 12/04/2011 0525   GFRAA >90 12/04/2011 0525    COAG Lab Results  Component Value Date   INR 1.15 12/03/2011   INR 1.05 11/27/2011   INR 1.0 10/11/2011   No results found for this basename: PTT     I/O last 3 completed shifts: In: 3828.3 [P.O.:720; I.V.:3058.3; IV Piggyback:50] Out: 4375 [Urine:4375] No data found.   Physical Examination  BP Readings from Last 3 Encounters:  12/04/11 143/76  12/04/11 143/76  11/27/11 172/90   Temp Readings from Last 3 Encounters:  12/04/11 98 F (36.7 C) Oral  12/04/11 98 F (36.7 C) Oral  11/27/11 97 F (36.1 C)    SpO2 Readings from Last 3 Encounters:  12/04/11 96%  12/04/11 96%  11/13/11 100%   Pulse Readings from Last 3 Encounters:  12/04/11 73  12/04/11 73  11/27/11 76    General: A&O x 3, WDWN male in NAD Gait: Normal Pulmonary: normal non-labored breathing  Cardiac: RRR Abdomen: soft, NT, NABS Bilateral groin wounds: clean, dry, intact, without hematoma Vascular Exam/Pulses:right palpable DP/PT pulses, Left doppler PT/DP pulses.  Skin is clean and warm  Extremities without ischemic changes, no Gangrene , no cellulitis; no open wounds;   Neurologic: A&O X 3; Appropriate Affect  Assessment: Luke [name] is  a 66 y.o. male who is 1 Day Post-Op EVAR.  Pt is doing well with no complaints  Plan: Home  The importance of surveillance of the endograft was discussed with the patient  A CTA of abdomen and pelvis will be scheduled for one month to assess for endoleak.  The patient will follow up with [place] in one month with these studies.   SignedMosetta Pigeon [phone] 12/04/2011 7:22 AM.  Agree with above. Doing well Groins look fine.  OK for d/c today  Di Kindle. Edilia Bo, MD, FACS Beeper [phone] 12/04/2011

## 2011-12-05 ENCOUNTER — Telehealth: Payer: Self-pay | Admitting: Vascular Surgery

## 2011-12-05 NOTE — Telephone Encounter (Signed)
Message copied by Fredrich Birks on Thu Dec 05, 2011 10:15 AM ------      Message from: Sharee Pimple      Created: Tue Dec 03, 2011  1:29 PM      Regarding: schedule                   ----- Message -----         From: Melene Plan, RN         Sent: 12/03/2011   1:25 PM           To: Sharee Pimple, CMA, [facility]                        ----- Message -----         From: Marlowe Shores, PA         Sent: 12/03/2011  11:01 AM           To: Melene Plan, RN            1 month F/U Dr Edilia Bo EVAR      Will order CTA

## 2011-12-05 NOTE — Telephone Encounter (Signed)
Notified wife of appointments. Sent letter also, dpm

## 2011-12-09 ENCOUNTER — Telehealth: Payer: Self-pay | Admitting: *Deleted

## 2011-12-09 NOTE — Telephone Encounter (Signed)
Luke [name] called to see if he could work out/ walk of treadmill. I instructed him that walking at a slower, steady pace would be OK now ( Post EVAR) but to hold off on anything more strenuous until after Dr. Edilia Bo sees him on 01-01-12 for his 1st postop office visit. He voiced understanding and wanted [place] to know that he was doing great.

## 2011-12-11 ENCOUNTER — Telehealth: Payer: Self-pay | Admitting: Family Medicine

## 2011-12-11 NOTE — Telephone Encounter (Signed)
Agreed -

## 2011-12-11 NOTE — Telephone Encounter (Signed)
Caller: Luke [name]/Patient; PCP: [name]); CB#: [phone]; Call regarding Hypertension;  Reports Blood Pressure continues to be elevated 148-150/88 while sitting.  Can feel blood pressure rising.  No headaches,dizziness or nosebleeds. Started Carvidelol 10/10/11.  Also notes fasting blood sugar are elevated ranging 140-200.  Had abdominal aortic aneyurism repair 12/10/11.  Advised to see MD within 24 hours for sudden elevation in blood pressure and has recently started taking prescription medication per Hypertension diagnosed or Suspected Guideline. Dr Para March has no appt for 12/11/11 or 12/12/11.  Appt scheduled with Dr Patsy Lager at 1100 12/12/11.

## 2011-12-12 ENCOUNTER — Encounter: Payer: Self-pay | Admitting: Family Medicine

## 2011-12-12 ENCOUNTER — Ambulatory Visit (INDEPENDENT_AMBULATORY_CARE_PROVIDER_SITE_OTHER): Payer: Medicare Other | Admitting: Family Medicine

## 2011-12-12 VITALS — BP 150/100 | HR 66 | Temp 97.9°F | Ht 75.0 in | Wt 170.0 lb

## 2011-12-12 DIAGNOSIS — I714 Abdominal aortic aneurysm, without rupture: Secondary | ICD-10-CM

## 2011-12-12 DIAGNOSIS — I798 Other disorders of arteries, arterioles and capillaries in diseases classified elsewhere: Secondary | ICD-10-CM | POA: Diagnosis not present

## 2011-12-12 DIAGNOSIS — I10 Essential (primary) hypertension: Secondary | ICD-10-CM | POA: Diagnosis not present

## 2011-12-12 DIAGNOSIS — E1159 Type 2 diabetes mellitus with other circulatory complications: Secondary | ICD-10-CM

## 2011-12-12 MED ORDER — GLIPIZIDE 5 MG PO TABS: 5.0000 mg | ORAL_TABLET | Freq: Two times a day (BID) | ORAL | Status: DC

## 2011-12-12 MED ORDER — CARVEDILOL 6.25 MG PO TABS: 6.2500 mg | ORAL_TABLET | Freq: Two times a day (BID) | ORAL | Status: DC

## 2011-12-12 NOTE — Patient Instructions (Addendum)
F/u Dr. Para March -- next 2-3 weeks

## 2011-12-12 NOTE — Progress Notes (Signed)
Nature conservation officer at [facility] [address] [place] [address] Phone: [phone] Fax: [phone]  Date:  12/12/2011   Name:  Luke [name]   DOB:  11/17/1945   MRN:  [id number]  PCP:  Crawford Givens, MD    Chief Complaint: Hypertension and Diabetes   History of Present Illness:  Luke [name] is a 66 y.o. very pleasant male patient who presents with the following:  S/p AAA repair on 12/04/2011, with a significant history of CAD, cardiomyopathy, PVD, and AAA repair slightly over 1 week ago.  BS up a little bit, has been up above 200's at times, other times it has been approaching 130's. Last a1c's have been 7.5 and 7.1 Compliant with diet  Lab Results  Component Value Date   HGBA1C 7.1* 12/03/2011    BP up a little bit. 150/100 today, elevated at home, as well.  BP Readings from Last 3 Encounters:  12/12/11 150/100  12/04/11 103/67  12/04/11 103/67   Currently on Coreg and lisonopril for BP and CHF    Patient Active Problem List  Diagnosis  . Type 2 diabetes mellitus with vascular disease  . ESSENTIAL HYPERTENSION, BENIGN  . ORGANIC IMPOTENCE  . AAA (abdominal aortic aneurysm)  . PVD (peripheral vascular disease)  . Abdominal aneurysm without mention of rupture  . CAD (coronary artery disease)  . Cardiomyopathy  . Ejection fraction < 50%  . LBBB (left bundle branch block)  . Medicare welcome exam  . Shoulder pain    Past Medical History  Diagnosis Date  . Diabetes mellitus     type II  . PAD (peripheral artery disease)   . AAA (abdominal aortic aneurysm) 2011    Per vascular surgery  . HLD (hyperlipidemia)   . Hypertension     white coat HTN-- often elevated in office and controlled on outside checks.  . Low testosterone     Hx of  . Cardiomyopathy     Nuclear, October 09, 2011, EF 30%, multiple focal wall motion abnormalities  . Ejection fraction < 50%     EF 30%, nuclear, October 09, 2011  . LBBB (left bundle branch block)     LBBB on  EKG October 11, 2011,  no prior EKG has been done  . CAD (coronary artery disease)     Presumed CAD with nuclear scan October 09, 2011,  large anteroseptal MI and inferior MI. Catheterization scheduled October 15, 2011  . Anxiety     no med. in use for anxiety but pt. speaks openly of his stress & anxious feelings regarding impending surgery      Past Surgical History  Procedure Date  . Tonsillectomy     as a child   . Colonoscopy   . Cardiac catheterization     History  Substance Use Topics  . Smoking status: Former Smoker    Types: Cigarettes    Quit date: 05/06/2004  . Smokeless tobacco: Never Used   Comment: quit about 7 years ago  . Alcohol Use: 1.2 oz/week    2 Cans of beer per week     beer or wine  occassionally    Family History  Problem Relation Age of Onset  . Hypertension Mother   . Diabetes Sister   . Heart disease Sister   . Hypertension Sister     Allergies  Allergen Reactions  . Codeine Hives    Medication list has been reviewed and updated.  Current Outpatient Prescriptions on  File Prior to Visit  Medication Sig Dispense Refill  . ACCU-CHEK AVIVA PLUS test strip CHECK BLOOD SUGAR three times a day 2 hours after meals  100 each  PRN  . aspirin 325 MG tablet Take 325 mg by mouth daily.      . carvedilol (COREG) 3.125 MG tablet Take 1 tablet (3.125 mg total) by mouth 2 (two) times daily.  180 tablet  3  . lisinopril (PRINIVIL,ZESTRIL) 40 MG tablet Take 20 mg by mouth 2 (two) times daily.       . metFORMIN (GLUCOPHAGE) 500 MG tablet Take 1,000 mg by mouth 2 (two) times daily with a meal.      . Multiple Vitamin (MULTIVITAMIN) tablet Take 1 tablet by mouth daily.        . sildenafil (VIAGRA) 50 MG tablet Take 50 mg by mouth daily as needed. For sexual activity      . simvastatin (ZOCOR) 40 MG tablet Take 40 mg by mouth at bedtime.      Marland Kitchen oxyCODONE-acetaminophen (ROXICET) 5-325 MG per tablet Take 1 tablet by mouth every 4 (four) hours as needed for pain.  30 tablet   0    Review of Systems:  As above, no cp, sob, abdominal pain, incision minimally tender  Physical Examination: Filed Vitals:   12/12/11 1051  BP: 150/100  Pulse: 66  Temp: 97.9 F (36.6 C)   Filed Vitals:   12/12/11 1051  Height: 6\' 3"  (1.905 m)  Weight: 170 lb (77.111 kg)   Body mass index is 21.25 kg/(m^2). Ideal Body Weight: Weight in (lb) to have BMI = 25: 199.6    GEN: WDWN, NAD, Non-toxic, A & O x 3 HEENT: Atraumatic, Normocephalic. Neck supple. No masses, No LAD. Ears and Nose: No external deformity. CV: RRR, No M/G/R. No JVD. No thrill. No extra heart sounds. PULM: CTA B, no wheezes, crackles, rhonchi. No retractions. No resp. distress. No accessory muscle use. ABD, s, nt, nd, +BS Groin incision c/d/i with minimal bruising EXTR: No c/c/e NEURO Normal gait.  PSYCH: Normally interactive. Conversant. Not depressed or anxious appearing.  Calm demeanor.    Assessment and Plan:  1. Essential hypertension, benign   2. Type 2 diabetes mellitus with vascular disease   3. AAA (abdominal aortic aneurysm)    Elevated BP and BS may be post-surgical stress phenomenon, but needs tighter control now.  Add glipizide  Pt asks about d/c coreg, but I strongly counselled him this was a bad idea with CHF and not to do so unless his cadiologist explicitly tells him to. For now, incr Coreg to 6.25 mg bid, close f/u with PCP  Orders Today:  No orders of the defined types were placed in this encounter.    Medications Today: (Includes new updates added during medication reconciliation) Meds ordered this encounter  Medications  . carvedilol (COREG) 6.25 MG tablet    Sig: Take 1 tablet (6.25 mg total) by mouth 2 (two) times daily.    Dispense:  180 tablet    Refill:  3  . glipiZIDE (GLUCOTROL) 5 MG tablet    Sig: Take 1 tablet (5 mg total) by mouth 2 (two) times daily before a meal.    Dispense:  60 tablet    Refill:  3    Labs Results from 12/12/2011 Evaluation that have  returned: Results for orders placed during the hospital encounter of 12/03/11  GLUCOSE, CAPILLARY      Component Value Range   Glucose-Capillary 210 (*)  70 - 99 mg/dL  CBC      Component Value Range   WBC 9.8  4.0 - 10.5 K/uL   RBC 3.88 (*) 4.22 - 5.81 MIL/uL   Hemoglobin 12.7 (*) 13.0 - 17.0 g/dL   HCT 16.1 (*) 09.6 - 04.5 %   MCV 92.8  78.0 - 100.0 fL   MCH 32.7  26.0 - 34.0 pg   MCHC 35.3  30.0 - 36.0 g/dL   RDW 40.9  81.1 - 91.4 %   Platelets 182  150 - 400 K/uL  BASIC METABOLIC PANEL      Component Value Range   Sodium 138  135 - 145 mEq/L   Potassium 4.6  3.5 - 5.1 mEq/L   Chloride 103  96 - 112 mEq/L   CO2 25  19 - 32 mEq/L   Glucose, Bld 208 (*) 70 - 99 mg/dL   BUN 30 (*) 6 - 23 mg/dL   Creatinine, Ser 7.82  0.50 - 1.35 mg/dL   Calcium 9.1  8.4 - 95.6 mg/dL   GFR calc non Af Amer 84 (*) >90 mL/min   GFR calc Af Amer >90  >90 mL/min  APTT      Component Value Range   aPTT >200 (*) 24 - 37 seconds  CBC      Component Value Range   WBC 11.1 (*) 4.0 - 10.5 K/uL   RBC 4.12 (*) 4.22 - 5.81 MIL/uL   Hemoglobin 13.4  13.0 - 17.0 g/dL   HCT 21.3 (*) 08.6 - 57.8 %   MCV 93.2  78.0 - 100.0 fL   MCH 32.5  26.0 - 34.0 pg   MCHC 34.9  30.0 - 36.0 g/dL   RDW 46.9  62.9 - 52.8 %   Platelets 192  150 - 400 K/uL  CREATININE, SERUM      Component Value Range   Creatinine, Ser 0.91  0.50 - 1.35 mg/dL   GFR calc non Af Amer 87 (*) >90 mL/min   GFR calc Af Amer >90  >90 mL/min  PROTIME-INR      Component Value Range   Prothrombin Time 14.9  11.6 - 15.2 seconds   INR 1.15  0.00 - 1.49  MAGNESIUM      Component Value Range   Magnesium 1.9  1.5 - 2.5 mg/dL  HEMOGLOBIN U1L      Component Value Range   Hemoglobin A1C 7.1 (*) <5.7 %   Mean Plasma Glucose 157 (*) <117 mg/dL  CBC      Component Value Range   WBC 10.5  4.0 - 10.5 K/uL   RBC 3.76 (*) 4.22 - 5.81 MIL/uL   Hemoglobin 12.4 (*) 13.0 - 17.0 g/dL   HCT 24.4 (*) 01.0 - 27.2 %   MCV 93.6  78.0 - 100.0 fL   MCH 33.0   26.0 - 34.0 pg   MCHC 35.2  30.0 - 36.0 g/dL   RDW 53.6  64.4 - 03.4 %   Platelets 175  150 - 400 K/uL  BASIC METABOLIC PANEL      Component Value Range   Sodium 139  135 - 145 mEq/L   Potassium 4.2  3.5 - 5.1 mEq/L   Chloride 103  96 - 112 mEq/L   CO2 26  19 - 32 mEq/L   Glucose, Bld 204 (*) 70 - 99 mg/dL   BUN 18  6 - 23 mg/dL   Creatinine, Ser 7.42  0.50 - 1.35 mg/dL  Calcium 8.9  8.4 - 10.5 mg/dL   GFR calc non Af Amer 89 (*) >90 mL/min   GFR calc Af Amer >90  >90 mL/min  GLUCOSE, CAPILLARY      Component Value Range   Glucose-Capillary 134 (*) 70 - 99 mg/dL   Comment 1 Notify RN     Comment 2 Documented in Chart    GLUCOSE, CAPILLARY      Component Value Range   Glucose-Capillary 255 (*) 70 - 99 mg/dL   Comment 1 Documented in Chart     Comment 2 Notify RN    GLUCOSE, CAPILLARY      Component Value Range   Glucose-Capillary 198 (*) 70 - 99 mg/dL   Comment 1 Notify RN     Comment 2 Documented in Chart    GLUCOSE, CAPILLARY      Component Value Range   Glucose-Capillary 226 (*) 70 - 99 mg/dL   Comment 1 Notify RN     Comment 2 Documented in Chart       Hannah Beat, MD

## 2011-12-26 ENCOUNTER — Encounter: Payer: Self-pay | Admitting: Family Medicine

## 2011-12-26 ENCOUNTER — Ambulatory Visit (INDEPENDENT_AMBULATORY_CARE_PROVIDER_SITE_OTHER): Payer: Medicare Other | Admitting: Family Medicine

## 2011-12-26 VITALS — BP 162/90 | HR 77 | Temp 97.9°F | Wt 173.0 lb

## 2011-12-26 DIAGNOSIS — I10 Essential (primary) hypertension: Secondary | ICD-10-CM | POA: Diagnosis not present

## 2011-12-26 DIAGNOSIS — I714 Abdominal aortic aneurysm, without rupture, unspecified: Secondary | ICD-10-CM

## 2011-12-26 DIAGNOSIS — I798 Other disorders of arteries, arterioles and capillaries in diseases classified elsewhere: Secondary | ICD-10-CM

## 2011-12-26 DIAGNOSIS — E1159 Type 2 diabetes mellitus with other circulatory complications: Secondary | ICD-10-CM | POA: Diagnosis not present

## 2011-12-26 MED ORDER — LISINOPRIL 20 MG PO TABS: 20.0000 mg | ORAL_TABLET | Freq: Two times a day (BID) | ORAL | Status: DC

## 2011-12-26 MED ORDER — METFORMIN HCL 500 MG PO TABS: 1000.0000 mg | ORAL_TABLET | Freq: Two times a day (BID) | ORAL | Status: DC

## 2011-12-26 MED ORDER — GLIPIZIDE 5 MG PO TABS: 5.0000 mg | ORAL_TABLET | Freq: Two times a day (BID) | ORAL | Status: DC

## 2011-12-26 NOTE — Progress Notes (Signed)
Had AAA stent and is doing well overall postop.  Had f/u next week with vasc surgery.  No abd pain. BP has been improved at home compared to today here in clinic.  He can't exercise in meantime and this is likely affecting BP.   A1c was 7.1 on last check.  Reviewed with patient.  Sugar was up >200 and was started on glipizide.  Sugar has been ~90s in AM since then.  Feeling well in meantime.  He had one low sugar after starting the glipizide.  He can't exercise yet; he hopes to be approved for exercise at vascular f/u OV.   Meds, vitals, and allergies reviewed.   ROS: See HPI.  Otherwise, noncontributory.  nad ncat rrr ctab abd soft, not ttp Ext w/o edema

## 2011-12-26 NOTE — Assessment & Plan Note (Signed)
Continue as is with meds for now and taper glipizide as needed.  I expect this to improve as he can start back with exercise.

## 2011-12-26 NOTE — Patient Instructions (Signed)
As you are able to exercise more, cut the glipizide in half and then eventually stop if sugar is controlled.  Recheck A1c in 6 months before a physical.   I would get a flu shot each fall.   Take care.

## 2011-12-26 NOTE — Assessment & Plan Note (Signed)
S/p stent and has f/u pending.

## 2011-12-26 NOTE — Assessment & Plan Note (Addendum)
Continue as is with meds.  I expect this to improve as he can start back with exercise.  Check BMET today given the procedure.

## 2011-12-31 ENCOUNTER — Encounter: Payer: Self-pay | Admitting: Vascular Surgery

## 2012-01-01 ENCOUNTER — Ambulatory Visit (INDEPENDENT_AMBULATORY_CARE_PROVIDER_SITE_OTHER): Payer: Medicare Other | Admitting: Vascular Surgery

## 2012-01-01 ENCOUNTER — Encounter: Payer: Self-pay | Admitting: Vascular Surgery

## 2012-01-01 ENCOUNTER — Ambulatory Visit
Admission: RE | Admit: 2012-01-01 | Discharge: 2012-01-01 | Disposition: A | Discharge status: Home / Self Care | Payer: Medicare Other | Source: Ambulatory Visit | Attending: Thoracic Diseases | Admitting: Thoracic Diseases

## 2012-01-01 VITALS — BP 178/89 | HR 64 | Temp 97.9°F | Ht 75.0 in | Wt 175.0 lb

## 2012-01-01 DIAGNOSIS — Z48812 Encounter for surgical aftercare following surgery on the circulatory system: Secondary | ICD-10-CM

## 2012-01-01 DIAGNOSIS — I714 Abdominal aortic aneurysm, without rupture: Secondary | ICD-10-CM | POA: Diagnosis not present

## 2012-01-01 MED ORDER — IOHEXOL 350 MG/ML SOLN
80.0000 mL | Freq: Once | INTRAVENOUS | Status: AC | PRN
  Administered 2012-01-01: 80 mL via INTRAVENOUS

## 2012-01-01 NOTE — Progress Notes (Signed)
[facility]  Patient name: Luke [name] MRN: [id number] DOB: May 13, 1945 Sex: male  REASON FOR VISIT: follow up after EVAR  HPI: Luke [name] is a 66 y.o. male who had presented with a 6 cm infrarenal abdominal aortic aneurysm. He underwent EVAR with a Gore Excluder device using 3 components on 12/03/2011. He comes in for a one-month follow up visit. He has had no abdominal pain or back pain. He has been gradually resuming his normal activities.   REVIEW OF SYSTEMS: Arly.Keller ] denotes positive finding; [  ] denotes negative finding  CARDIOVASCULAR:  [ ]  chest pain   [ ]  dyspnea on exertion    CONSTITUTIONAL:  [ ]  fever   [ ]  chills  PHYSICAL EXAM: Filed Vitals:   01/01/12 1103  BP: 178/89  Pulse: 64  Temp: 97.9 F (36.6 C)  TempSrc: Oral  Height: 6\' 3"  (1.905 m)  Weight: 175 lb (79.379 kg)  SpO2: 98%   Body mass index is 21.87 kg/(m^2). GENERAL: The patient is a well-nourished male, in no acute distress. The vital signs are documented above. CARDIOVASCULAR: There is a regular rate and rhythm. He has palpable femoral pulses. His aneurysm is not pulsatile. Both feet are warm and well-perfused.  PULMONARY: There is good air exchange bilaterally without wheezing or rales. There are no groin hematomas.  I reviewed his CT scan which shows that the stent graft is in excellent position. There may be a small type II endoleak from the IMA. The aneurysm measured 6 cm in maximum diameter.  MEDICAL ISSUES: The patient is doing well status post EVAR. He has a small type II endoleak related to the IMA most likely. I've ordered a follow up CT scan in 6 months and will see him back at that time. He knows to call sooner if he has problems.  Claryce Friel S [facility] [place] [facility]: [phone]

## 2012-01-01 NOTE — Addendum Note (Signed)
Addended by: Sharee Pimple on: 01/01/2012 02:08 PM   Modules accepted: Orders

## 2012-02-05 ENCOUNTER — Telehealth: Payer: Self-pay | Admitting: Family Medicine

## 2012-02-05 ENCOUNTER — Other Ambulatory Visit (INDEPENDENT_AMBULATORY_CARE_PROVIDER_SITE_OTHER): Payer: Medicare Other

## 2012-02-05 DIAGNOSIS — E119 Type 2 diabetes mellitus without complications: Secondary | ICD-10-CM | POA: Diagnosis not present

## 2012-02-05 DIAGNOSIS — IMO0002 Reserved for concepts with insufficient information to code with codable children: Secondary | ICD-10-CM

## 2012-02-05 LAB — BASIC METABOLIC PANEL
BUN: 31 mg/dL — ABNORMAL HIGH (ref 6–23)
CO2: 27 mEq/L (ref 19–32)
Calcium: 9.4 mg/dL (ref 8.4–10.5)
Chloride: 104 mEq/L (ref 96–112)
Creatinine, Ser: 1.2 mg/dL (ref 0.4–1.5)
GFR: 67.76 mL/min (ref >60.00)
Glucose, Bld: 187 mg/dL — ABNORMAL HIGH (ref 70–99)
Potassium: 4.9 mEq/L (ref 3.5–5.1)
Sodium: 138 mEq/L (ref 135–145)

## 2012-02-05 LAB — HEMOGLOBIN A1C: Hgb A1c MFr Bld: 6.7 % — ABNORMAL HIGH (ref 4.6–6.5)

## 2012-02-05 NOTE — Telephone Encounter (Signed)
bmet order added.

## 2012-02-05 NOTE — Addendum Note (Signed)
Addended by: Alvina Chou on: 02/05/2012 12:12 PM   Modules accepted: Orders

## 2012-02-11 ENCOUNTER — Ambulatory Visit (INDEPENDENT_AMBULATORY_CARE_PROVIDER_SITE_OTHER): Payer: Medicare Other | Admitting: Family Medicine

## 2012-02-11 ENCOUNTER — Other Ambulatory Visit: Payer: Medicare Other

## 2012-02-11 ENCOUNTER — Encounter: Payer: Self-pay | Admitting: Family Medicine

## 2012-02-11 VITALS — BP 182/96 | HR 79 | Temp 97.5°F | Wt 181.0 lb

## 2012-02-11 DIAGNOSIS — Z23 Encounter for immunization: Secondary | ICD-10-CM

## 2012-02-11 DIAGNOSIS — I798 Other disorders of arteries, arterioles and capillaries in diseases classified elsewhere: Secondary | ICD-10-CM

## 2012-02-11 DIAGNOSIS — E1159 Type 2 diabetes mellitus with other circulatory complications: Secondary | ICD-10-CM | POA: Diagnosis not present

## 2012-02-11 DIAGNOSIS — L821 Other seborrheic keratosis: Secondary | ICD-10-CM

## 2012-02-11 DIAGNOSIS — I10 Essential (primary) hypertension: Secondary | ICD-10-CM

## 2012-02-11 DIAGNOSIS — E119 Type 2 diabetes mellitus without complications: Secondary | ICD-10-CM

## 2012-02-11 NOTE — Progress Notes (Signed)
Diabetes:  Using medications without difficulties: yes Hypoglycemic episodes:only if prolonged fasting- down to 70s but not lower Hyperglycemic episodes:no Feet problems:no change Blood Sugars averaging: likely with dawn effect- 160s in AM eye exam within last year: has f/u pending for next week.   A1c improved to 6.7  BP has been controlled out of office. He often gets elevated BP in clinic.  He's back exercising and has routine f/u with vascular surgery scheduled. He doesn't have leg cramping with exercise.   Likely irritated SK on L arm.  D/w pt about options.   Meds, vitals, and allergies reviewed.   ROS: See HPI.  Otherwise negative.    GEN: nad, alert and oriented HEENT: mucous membranes moist NECK: supple w/o LA CV: rrr. PULM: ctab, no inc wob ABD: soft, +bs EXT: no edema SKIN: no acute rash but irritated, small SK (<1cm) on L arm noted.   Diabetic foot exam: Normal inspection No skin breakdown Dry skin with minimal calluses noted.  1+ DP pulses Normal sensation to light touch and monofilament Nails normal

## 2012-02-11 NOTE — Patient Instructions (Addendum)
Recheck A1c/lipids in 6 months and then come see me after that.  Take care.  Glad to see you.

## 2012-02-12 DIAGNOSIS — L821 Other seborrheic keratosis: Secondary | ICD-10-CM | POA: Insufficient documentation

## 2012-02-12 NOTE — Assessment & Plan Note (Signed)
Improved A1c, continue current meds.  Recheck in 2014.  He agrees.

## 2012-02-12 NOTE — Assessment & Plan Note (Signed)
Treated today with liq N2 x3 after verbal informed consent obtained and and tolerated well. No complications.

## 2012-02-12 NOTE — Assessment & Plan Note (Signed)
Controlled out of office, continue current meds and exercise.  He agrees.

## 2012-05-02 ENCOUNTER — Other Ambulatory Visit: Payer: Self-pay | Admitting: Family Medicine

## 2012-05-07 ENCOUNTER — Other Ambulatory Visit: Payer: Self-pay | Admitting: *Deleted

## 2012-05-07 ENCOUNTER — Other Ambulatory Visit: Payer: Self-pay | Admitting: Family Medicine

## 2012-06-29 ENCOUNTER — Other Ambulatory Visit: Payer: Self-pay | Admitting: Vascular Surgery

## 2012-06-29 DIAGNOSIS — I714 Abdominal aortic aneurysm, without rupture: Secondary | ICD-10-CM | POA: Diagnosis not present

## 2012-06-29 LAB — BUN: BUN: 31 mg/dL — ABNORMAL HIGH (ref 6–23)

## 2012-06-29 LAB — CREATININE, SERUM: Creat: 1.01 mg/dL (ref 0.50–1.35)

## 2012-06-30 ENCOUNTER — Encounter: Payer: Self-pay | Admitting: Vascular Surgery

## 2012-07-01 ENCOUNTER — Ambulatory Visit
Admission: RE | Admit: 2012-07-01 | Discharge: 2012-07-01 | Disposition: A | Discharge status: Home / Self Care | Payer: Medicare Other | Source: Ambulatory Visit | Attending: Vascular Surgery | Admitting: Vascular Surgery

## 2012-07-01 ENCOUNTER — Encounter: Payer: Self-pay | Admitting: Vascular Surgery

## 2012-07-01 ENCOUNTER — Ambulatory Visit (INDEPENDENT_AMBULATORY_CARE_PROVIDER_SITE_OTHER): Payer: Medicare Other | Admitting: Vascular Surgery

## 2012-07-01 VITALS — BP 160/70 | HR 87 | Ht 75.0 in | Wt 182.0 lb

## 2012-07-01 DIAGNOSIS — I714 Abdominal aortic aneurysm, without rupture: Secondary | ICD-10-CM

## 2012-07-01 DIAGNOSIS — Z95828 Presence of other vascular implants and grafts: Secondary | ICD-10-CM | POA: Diagnosis not present

## 2012-07-01 DIAGNOSIS — Z48812 Encounter for surgical aftercare following surgery on the circulatory system: Secondary | ICD-10-CM

## 2012-07-01 MED ORDER — IOHEXOL 350 MG/ML SOLN
100.0000 mL | Freq: Once | INTRAVENOUS | Status: AC | PRN
  Administered 2012-07-01: 100 mL via INTRAVENOUS

## 2012-07-01 NOTE — Progress Notes (Signed)
[facility] and Ve[facility] Specialist of [facility]  Patient name: Luke [name] MRN: [id number] DOB: 1945/06/08 Sex: male  REASON FOR VISIT: follow up after EVAR  HPI: Luke [name] is a 67 y.o. male who underwent an endovascular aneurysm repair for a 6 cm infrarenal abdominal aortic aneurysm on 12/03/2011. He comes in for a 6 month follow up visit. He denies any abdominal pain or significant back pain. There is been no significant change in his medical history. He remains fairly active and works out at [employer].  REVIEW OF SYSTEMS: Arly.Keller ] denotes positive finding; [  ] denotes negative finding  CARDIOVASCULAR:  [ ]  chest pain   [ ]  dyspnea on exertion    CONSTITUTIONAL:  [ ]  fever   [ ]  chills  PHYSICAL EXAM: Filed Vitals:   07/01/12 1310  BP: 160/70  Pulse: 87  Height: 6\' 3"  (1.905 m)  Weight: 182 lb (82.555 kg)  SpO2: 98%   Body mass index is 22.75 kg/(m^2). GENERAL: The patient is a well-nourished male, in no acute distress. The vital signs are documented above. CARDIOVASCULAR: There is a regular rate and rhythm  PULMONARY: There is good air exchange bilaterally without wheezing or rales. He has palpable femoral pulses. He has no significant lower extremity swelling.  I reviewed his CT scan which shows that his stent graft is in good position. There is no evidence of endoleak. The aneurysm measures just under 6 cm in maximum diameter by my measurement.  MEDICAL ISSUES:  Abdominal aneurysm without mention of rupture Patient is doing well status post EVAR. CT scan looks good without evidence of endoleak. The graft is in excellent position. I'll see him back in 6 months with a duplex of his EVAR. He knows to call sooner if he has problems.   Joey Lierman S [facility] [place] [facility]: [phone]

## 2012-07-01 NOTE — Assessment & Plan Note (Signed)
Patient is doing well status post EVAR. CT scan looks good without evidence of endoleak. The graft is in excellent position. I'll see him back in 6 months with a duplex of his EVAR. He knows to call sooner if he has problems.

## 2012-07-02 NOTE — Addendum Note (Signed)
Addended by: Sharee Pimple on: 07/02/2012 08:17 AM   Modules accepted: Orders

## 2012-07-14 ENCOUNTER — Ambulatory Visit (INDEPENDENT_AMBULATORY_CARE_PROVIDER_SITE_OTHER): Payer: Medicare Other

## 2012-07-14 ENCOUNTER — Telehealth: Payer: Self-pay | Admitting: Cardiology

## 2012-07-14 ENCOUNTER — Inpatient Hospital Stay (HOSPITAL_COMMUNITY)
Admission: EM | Admit: 2012-07-14 | Discharge: 2012-07-17 | DRG: 227 | Disposition: A | Discharge status: Home / Self Care | Payer: Medicare Other | Attending: Internal Medicine | Admitting: Internal Medicine

## 2012-07-14 ENCOUNTER — Ambulatory Visit (INDEPENDENT_AMBULATORY_CARE_PROVIDER_SITE_OTHER): Payer: Medicare Other | Admitting: Internal Medicine

## 2012-07-14 ENCOUNTER — Encounter: Payer: Self-pay | Admitting: Internal Medicine

## 2012-07-14 ENCOUNTER — Encounter (HOSPITAL_COMMUNITY): Payer: Self-pay | Admitting: Nurse Practitioner

## 2012-07-14 VITALS — BP 170/74 | HR 36 | Resp 12 | Ht 75.0 in | Wt 180.8 lb

## 2012-07-14 DIAGNOSIS — I714 Abdominal aortic aneurysm, without rupture: Secondary | ICD-10-CM

## 2012-07-14 DIAGNOSIS — I442 Atrioventricular block, complete: Secondary | ICD-10-CM

## 2012-07-14 DIAGNOSIS — Z0000 Encounter for general adult medical examination without abnormal findings: Secondary | ICD-10-CM

## 2012-07-14 DIAGNOSIS — Z79899 Other long term (current) drug therapy: Secondary | ICD-10-CM

## 2012-07-14 DIAGNOSIS — R9431 Abnormal electrocardiogram [ECG] [EKG]: Secondary | ICD-10-CM | POA: Diagnosis not present

## 2012-07-14 DIAGNOSIS — I447 Left bundle-branch block, unspecified: Secondary | ICD-10-CM | POA: Diagnosis present

## 2012-07-14 DIAGNOSIS — Z7982 Long term (current) use of aspirin: Secondary | ICD-10-CM | POA: Diagnosis not present

## 2012-07-14 DIAGNOSIS — I2510 Atherosclerotic heart disease of native coronary artery without angina pectoris: Secondary | ICD-10-CM | POA: Diagnosis not present

## 2012-07-14 DIAGNOSIS — I10 Essential (primary) hypertension: Secondary | ICD-10-CM | POA: Diagnosis present

## 2012-07-14 DIAGNOSIS — I739 Peripheral vascular disease, unspecified: Secondary | ICD-10-CM

## 2012-07-14 DIAGNOSIS — Z833 Family history of diabetes mellitus: Secondary | ICD-10-CM | POA: Diagnosis not present

## 2012-07-14 DIAGNOSIS — I2589 Other forms of chronic ischemic heart disease: Secondary | ICD-10-CM | POA: Diagnosis present

## 2012-07-14 DIAGNOSIS — L821 Other seborrheic keratosis: Secondary | ICD-10-CM

## 2012-07-14 DIAGNOSIS — E119 Type 2 diabetes mellitus without complications: Secondary | ICD-10-CM | POA: Diagnosis present

## 2012-07-14 DIAGNOSIS — I509 Heart failure, unspecified: Secondary | ICD-10-CM | POA: Diagnosis not present

## 2012-07-14 DIAGNOSIS — Z87891 Personal history of nicotine dependence: Secondary | ICD-10-CM | POA: Diagnosis not present

## 2012-07-14 DIAGNOSIS — Z452 Encounter for adjustment and management of vascular access device: Secondary | ICD-10-CM | POA: Diagnosis not present

## 2012-07-14 DIAGNOSIS — E785 Hyperlipidemia, unspecified: Secondary | ICD-10-CM | POA: Diagnosis present

## 2012-07-14 DIAGNOSIS — I255 Ischemic cardiomyopathy: Secondary | ICD-10-CM | POA: Diagnosis present

## 2012-07-14 DIAGNOSIS — Z8249 Family history of ischemic heart disease and other diseases of the circulatory system: Secondary | ICD-10-CM

## 2012-07-14 DIAGNOSIS — N529 Male erectile dysfunction, unspecified: Secondary | ICD-10-CM

## 2012-07-14 DIAGNOSIS — I517 Cardiomegaly: Secondary | ICD-10-CM | POA: Diagnosis not present

## 2012-07-14 DIAGNOSIS — E1159 Type 2 diabetes mellitus with other circulatory complications: Secondary | ICD-10-CM

## 2012-07-14 LAB — CBC WITH DIFFERENTIAL/PLATELET
Basophils Absolute: 0 10*3/uL (ref 0.0–0.1)
Basophils Relative: 0 % (ref 0–1)
Eosinophils Absolute: 0.1 10*3/uL (ref 0.0–0.7)
Eosinophils Relative: 1 % (ref 0–5)
HCT: 38.8 % — ABNORMAL LOW (ref 39.0–52.0)
Hemoglobin: 13.9 g/dL (ref 13.0–17.0)
Lymphocytes Relative: 22 % (ref 12–46)
Lymphs Abs: 2.5 10*3/uL (ref 0.7–4.0)
MCH: 33 pg (ref 26.0–34.0)
MCHC: 35.8 g/dL (ref 30.0–36.0)
MCV: 92.2 fL (ref 78.0–100.0)
Monocytes Absolute: 0.7 10*3/uL (ref 0.1–1.0)
Monocytes Relative: 6 % (ref 3–12)
Neutro Abs: 7.8 10*3/uL — ABNORMAL HIGH (ref 1.7–7.7)
Neutrophils Relative %: 71 % (ref 43–77)
Platelets: 260 10*3/uL (ref 150–400)
RBC: 4.21 MIL/uL — ABNORMAL LOW (ref 4.22–5.81)
RDW: 12.9 % (ref 11.5–15.5)
WBC: 11 10*3/uL — ABNORMAL HIGH (ref 4.0–10.5)

## 2012-07-14 LAB — COMPREHENSIVE METABOLIC PANEL
ALT: 16 U/L (ref 0–53)
AST: 15 U/L (ref 0–37)
Albumin: 4 g/dL (ref 3.5–5.2)
Alkaline Phosphatase: 56 U/L (ref 39–117)
BUN: 27 mg/dL — ABNORMAL HIGH (ref 6–23)
CO2: 22 mEq/L (ref 19–32)
Calcium: 10.2 mg/dL (ref 8.4–10.5)
Chloride: 103 mEq/L (ref 96–112)
Creatinine, Ser: 1.02 mg/dL (ref 0.50–1.35)
GFR calc Af Amer: 87 mL/min — ABNORMAL LOW (ref >90)
GFR calc non Af Amer: 75 mL/min — ABNORMAL LOW (ref >90)
Glucose, Bld: 147 mg/dL — ABNORMAL HIGH (ref 70–99)
Potassium: 4.2 mEq/L (ref 3.5–5.1)
Sodium: 138 mEq/L (ref 135–145)
Total Bilirubin: 0.4 mg/dL (ref 0.3–1.2)
Total Protein: 7.4 g/dL (ref 6.0–8.3)

## 2012-07-14 LAB — BASIC METABOLIC PANEL
BUN: 29 mg/dL — ABNORMAL HIGH (ref 6–23)
CO2: 23 mEq/L (ref 19–32)
Calcium: 10.2 mg/dL (ref 8.4–10.5)
Chloride: 100 mEq/L (ref 96–112)
Creatinine, Ser: 1.12 mg/dL (ref 0.50–1.35)
GFR calc Af Amer: 78 mL/min — ABNORMAL LOW (ref >90)
GFR calc non Af Amer: 67 mL/min — ABNORMAL LOW (ref >90)
Glucose, Bld: 241 mg/dL — ABNORMAL HIGH (ref 70–99)
Potassium: 4.5 mEq/L (ref 3.5–5.1)
Sodium: 134 mEq/L — ABNORMAL LOW (ref 135–145)

## 2012-07-14 LAB — POCT I-STAT TROPONIN I: Troponin i, poc: 0.15 ng/mL (ref 0.00–0.08)

## 2012-07-14 LAB — MRSA PCR SCREENING: MRSA by PCR: NEGATIVE

## 2012-07-14 LAB — GLUCOSE, CAPILLARY
Glucose-Capillary: 118 mg/dL — ABNORMAL HIGH (ref 70–99)
Glucose-Capillary: 122 mg/dL — ABNORMAL HIGH (ref 70–99)
Glucose-Capillary: 205 mg/dL — ABNORMAL HIGH (ref 70–99)
Glucose-Capillary: 153 mg/dL — ABNORMAL HIGH (ref 70–99)

## 2012-07-14 LAB — MAGNESIUM: Magnesium: 2 mg/dL (ref 1.5–2.5)

## 2012-07-14 LAB — TROPONIN I
Troponin I: <0.3 ng/mL (ref <0.30)
Troponin I: <0.3 ng/mL (ref <0.30)

## 2012-07-14 MED ORDER — ACETAMINOPHEN 325 MG PO TABS: 650.0000 mg | ORAL_TABLET | ORAL | Status: DC | PRN

## 2012-07-14 MED ORDER — ONDANSETRON HCL 4 MG/2ML IJ SOLN: 4.0000 mg | Freq: Four times a day (QID) | INTRAMUSCULAR | Status: DC | PRN

## 2012-07-14 NOTE — Assessment & Plan Note (Signed)
As above.

## 2012-07-14 NOTE — Telephone Encounter (Signed)
Pt to come in for an ekg per Dr Graciela Husbands.  Pt agrees.

## 2012-07-14 NOTE — Progress Notes (Signed)
Pt called reporting a slow heart rate.  He came in at the request of Dr Graciela Husbands for an EKG.  EKG was done and given to Dr Graciela Husbands who then saw pt for a work in office visit.

## 2012-07-14 NOTE — ED Notes (Signed)
Pt was sent by dr Graciela Husbands cardiology for possible pacemaker insertion. Pt states he has been in L bundle branch block for several months and today his heart slowed down for longer than normal. Reports normally his rate will come back up after about 30 minutes but today it stayed in the 30's for hours. Now, A&Ox4, no complaints

## 2012-07-14 NOTE — Assessment & Plan Note (Signed)
The patient has intermittent complete heart block in the setting of ischemic and nonischemic cardiomyopathy with an ejection fraction of 30% It is not likely that this is related to the carvedilol as he had symptoms of lightheadedness and a low pulse at least one time prior to his cardiac evaluation.  He will need his carvedilol for cardiomyopathy protection and so pacing is almost certainly going to be required. We'll undertake repeat echo to look at left ventricular function as ICD implantation were also be probably appropriate in the event of need pacing in this situation CRT would be correct  We reviewed the potential benefits and risks of device implantation.  As he is on carvedilol, we will plan to admit him and let him wash out his carvedilol. Clearly his situation is less stable than it has been as he has had protracted complete heart block this morning. We will be attached to transcutaneous pacing with implanted pacemaker tomorrow or Thursday

## 2012-07-14 NOTE — Telephone Encounter (Signed)
New Problem:    Patient called in because when he work up this morning his heart rate is half of what it is normally with some SOB.  Patient denied any chest pressure.  Patient has experienced this in the past but the were breif episodes that went away. Please call back.

## 2012-07-14 NOTE — Assessment & Plan Note (Signed)
We'll plan to resume beta blockers following device implantation. Consideration for other therapies including Aldactone will be appropriate.

## 2012-07-14 NOTE — Progress Notes (Signed)
ELECTROPHYSIOLOGY CONSULT NOTE  Patient ID: Luke [name], MRN: [id number], DOB/AGE: 1945/11/05 67 y.o. Admit date: (Not on file) Date of Consult: 07/14/2012  Primary Physician: Crawford Givens, MD Primary Cardiologist: Myrtis Ser Chief Complaint:   HPI Luke [name] is a 67 y.o. male   Seen following a call and he complained that his heart rate, normally in the 70s was in the 30s. His ECG was noted to have left bundle branch block. He was advised to come in. He was found to be in complete heart block.  He mild exercise intolerance. He has known diffuse vascular disease. He underwent aortic stent grafting last year. Prior to that he underwent evaluation. This demonstrated Abnormal stress nuclear study. There is evidence of a previous large anterior septal MI with an inferior septal MI. The LV is markedly dilated with global hypokinesis. There is severe hypokinesis in the anterior septal and inferior septal walls.  LV Ejection Fraction: 30%. He underwent catheterization which confirmed severe LV dysfunction with moderate to severe 2 vessel disease not thought to be appropriate for intervention.  He's been on carvedilol for the last 6 or 8 months. He did have one of these episodes prior to carvedilol initiation  He describes this as being associated with presyncope and weakness  He denies chest pain or peripheral edema. He does have diabetes. He's had no syncope. That a transient neurological lateralizing events     Past Medical History  Diagnosis Date  . Diabetes mellitus     type II  . PAD (peripheral artery disease)   . AAA (abdominal aortic aneurysm) 2011    Per vascular surgery  . HLD (hyperlipidemia)   . Hypertension     white coat HTN-- often elevated in office and controlled on outside checks.  . Low testosterone     Hx of  . Cardiomyopathy     Nuclear, October 09, 2011, EF 30%, multiple focal wall motion abnormalities  . Ejection fraction < 50%     EF 30%, nuclear, October 09, 2011  . LBBB (left bundle branch block)     LBBB on EKG October 11, 2011,  no prior EKG has been done  . CAD (coronary artery disease)     Presumed CAD with nuclear scan October 09, 2011,  large anteroseptal MI and inferior MI. Catheterization scheduled October 15, 2011  . Anxiety     no med. in use for anxiety but pt. speaks openly of his stress & anxious feelings regarding impending surgery        Surgical History:  Past Surgical History  Procedure Laterality Date  . Tonsillectomy      as a child   . Colonoscopy    . Cardiac catheterization    . Abdominal aortic aneurysm repair      EVAR      Home Meds: Prior to Admission medications   Medication Sig Start Date End Date Taking? Authorizing Provider  ACCU-CHEK AVIVA PLUS test strip CHECK BLOOD SUGAR three times a day 2 hours after meals 05/02/12  Yes [name], MD  aspirin 325 MG tablet Take 325 mg by mouth daily.   Yes Historical Provider, MD  carvedilol (COREG) 6.25 MG tablet Take 1 tablet (6.25 mg total) by mouth 2 (two) times daily. 12/12/11 12/11/12 Yes Spencer Copland, MD  glipiZIDE (GLUCOTROL) 5 MG tablet Take 5 mg by mouth daily. 12/26/11 12/25/12 Yes [name], MD  lisinopril (PRINIVIL,ZESTRIL) 20 MG tablet Take 1 tablet (20 mg total)  by mouth 2 (two) times daily. 12/26/11  Yes [name], MD  metFORMIN (GLUCOPHAGE) 500 MG tablet Take 2 tablets (1,000 mg total) by mouth 2 (two) times daily with a meal. 12/26/11  Yes [name], MD  Multiple Vitamin (MULTIVITAMIN) tablet Take 1 tablet by mouth daily.     Yes Historical Provider, MD  sildenafil (VIAGRA) 50 MG tablet Take 50 mg by mouth daily as needed. For sexual activity   Yes Historical Provider, MD  simvastatin (ZOCOR) 40 MG tablet Take 40 mg by mouth at bedtime.   Yes Historical Provider, MD        Allergies:  Allergies  Allergen Reactions  . Codeine Hives    History   Social History  . Marital Status: Married    Spouse Name: N/A    Number of Children:  N/A  . Years of Education: N/A   Occupational History  . Not on file.   Social History Main Topics  . Smoking status: Former Smoker    Types: Cigarettes    Quit date: 05/06/2004  . Smokeless tobacco: Never Used     Comment: quit about 7 years ago  . Alcohol Use: 1.2 oz/week    2 Cans of beer per week     Comment: beer or wine  occassionally  . Drug Use: No  . Sexually Active: Not on file   Other Topics Concern  . 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 Hypertension Mother   . Diabetes Sister   . Heart disease Sister   . Hypertension Sister      ROS:  Please see the history of present illness.     All other systems reviewed and negative.    Physical Exam: Blood pressure 170/74, pulse 36, resp. rate 12, height 6\' 3"  (1.905 m), weight 180 lb 12.8 oz (82.01 kg). General: Well developed, well nourished male in no acute distress. Head: Normocephalic, atraumatic, sclera non-icteric, no xanthomas, nares are without discharge. Lymph Nodes:  none Back: without scoliosis/kyphosis, no CVA tendersness Neck: Negative for carotid bruits. JVD not elevated. Lungs: Clear bilaterally to auscultation without wheezes, rales, or rhonchi. Breathing is unlabored. Heart:slow but regular with S1 S2. No  murmur , rubs, or gallops appreciated. Abdomen: Soft, non-tender, non-distended with normoactive bowel sounds. No hepatomegaly. No rebound/guarding. No obvious abdominal masses. Msk:  Strength and tone appear normal for age. Extremities: No clubbing or cyanosis. No edema.  Distal pedal pulses are 2+ and equal bilaterally. Skin: Warm and Dry Neuro: Alert and oriented X 3. CN III-XII intact Grossly normal sensory and motor function . Psych:  Responds to questions appropriately with a normal affect.      Labs: Cardiac Enzymes No results found for this basename: CKTOTAL, CKMB, TROPONINI,  in the last 72  hours CBC Lab Results  Component Value Date   WBC 10.5 12/04/2011   HGB 12.4* 12/04/2011   HCT 35.2* 12/04/2011   MCV 93.6 12/04/2011   PLT 175 12/04/2011   PROTIME: No results found for this basename: LABPROT, INR,  in the last 72 hours Chemistry No results found for this basename: NA, K, CL, CO2, BUN, CREATININE, CALCIUM, LABALBU, PROT, BILITOT, ALKPHOS, ALT, AST, GLUCOSE,  in the last 168 hours Lipids Lab Results  Component Value Date   CHOL 157 07/30/2011   HDL 42.90 07/30/2011   LDLCALC 52 07/02/2010  TRIG 221.0* 07/30/2011   BNP No results found for this basename: probnp   Miscellaneous No results found for this basename: DDIMER    Radiology/Studies:  Ct Angio Abd/pel W/ And/or W/o  07/01/2012  *RADIOLOGY REPORT*  Clinical Data:  Abdominal aortic aneurysm post stent graft.  CT ANGIOGRAPHY ABDOMEN AND PELVIS  Technique:  Multidetector CT imaging of the abdomen and pelvis was performed using the standard protocol during bolus administration of intravenous contrast.  Multiplanar reconstructed images including MIPs were obtained and reviewed to evaluate the vascular anatomy.  Contrast: OMNIPAQUE IOHEXOL 350 MG/ML SOLN  Comparison:  01/01/2012  Arterial findings: Aorta:                  Mild atheromatous plaque and   nonocclusive eccentric   mural thrombus in the visualized distal descending thoracic   and suprarenal abdominal aorta. Patent bifurcated infrarenal aortic stent graft without endoleak. Native aneurysm sac diameter 5.5 x 6.4 cm, previously 5.9 x 6.6  Celiac axis:            Widely patent, unremarkable distal branching.  Superior mesenteric:Widely patent, classic distal branching anatomy.  Left renal:             Single, with an early bifurcation, widely patent.  Right renal:            Single, with partially calcified nonocclusive ostial plaque, patent distally.  Inferior mesenteric:Origin occlusion, reconstituted distally by visceral collaterals.  Left iliac:              Left limb of the stent graft extends to the distal common iliac without endoleak.  There is mild calcified plaque at the origin of the external iliac, which is widely patent distally without aneurysm.  Right iliac:            Right limb of the stent graft is patent, extending to the mid common iliac without endoleak.  Native external iliac artery widely patent.  Venous findings:  Patent hepatic veins, portal vein, superior mesenteric vein, splenic vein, bilateral renal veins, IVC.   Review of the MIP images confirms the above findings.  Nonvascular findings: Fibrotic changes in the visualized lung bases.  Unremarkable liver, nondilated gallbladder, spleen, adrenal glands, pancreas, kidneys.  Stomach, small bowel, and colon are nondilated.  Urinary bladder physiologically distended.  Moderate prostatic enlargement.  No ascites.  No free air.  Small umbilical and bilateral inguinal hernias containing only mesenteric fat. There is a benign appearing intramuscular lipoma in the left gluteal musculature.  Spurring and bilateral hips and throughout the lumbar spine.  IMPRESSION:  1.  Patent infrarenal bifurcated aortic stent graft without endoleak or other apparent complication, with slight interval decrease in diameter of excluded native aneurysm sac.   Original Report Authenticated By: D. Andria Rhein, MD     EKG:  CHB with RBBB escape  Prev ECG SR LBBB   Assessment and Plan:    Sherryl Manges

## 2012-07-14 NOTE — Telephone Encounter (Signed)
Luke [name] states he has had a couple episodes where his heart rate has decreased to around 45.  He woke up this am with the same problem.  Normally it goes back to it's normal rate in about 30 minutes but this morning it did not.  He denies sob, pain, pressure or dizziness.  BP today= 223/97  P=37 while I was on the phone with him.  BP normally is in the 120's/70's.

## 2012-07-14 NOTE — H&P (Signed)
ELECTROPHYSIOLOGY HISTORY & PHYSICAL  Patient ID: Luke [name] MRN: [id number], DOB/AGE: [birth year]   Date of Admission: 07/14/2012  Primary Physician: Crawford Givens, MD Primary Cardiologist: Willa Rough, MD Reason for Admission: Complete heart block  History of Present Illness Per Dr. Odessa Fleming office note today - Luke [name] is a 67 year old man seen by Dr. Graciela Husbands today as an add-on following a call stating his heart rate, normally in the 70s, was now in the 30s. His prior 12-lead ECG was noted to have left bundle branch block. He was advised to come in to the office for evaluation. He was found to be in complete heart block.  He has mild exercise intolerance. He has known diffuse vascular disease. He underwent aortic stent graft placement last year. Prior to that he underwent cardiac pre-op evaluation. This demonstrated an abnormal stress nuclear study with evidence of a previous large anteroseptal MI and inferoseptal MI. The LV is markedly dilated with global hypokinesis. EF 30%. There is severe hypokinesis in the anteroseptal and inferoseptal walls. He underwent catheterization which confirmed severe LV dysfunction with moderate to severe 2 vessel disease not thought to be appropriate for intervention.  He has been on carvedilol for the last 6 or 8 months. He did have one of these episodes prior to carvedilol initiation.  He describes this as being associated with presyncope and weakness.  He denies chest pain or syncope.   Past Medical History Past Medical History  Diagnosis Date  . Diabetes mellitus     type II  . PAD (peripheral artery disease)   . AAA (abdominal aortic aneurysm) 2011    Per vascular surgery  . HLD (hyperlipidemia)   . Hypertension     white coat HTN-- often elevated in office and controlled on outside checks.  . Low testosterone     Hx of  . Cardiomyopathy     Nuclear, October 09, 2011, EF 30%, multiple focal wall motion abnormalities  . Ejection fraction  < 50%     EF 30%, nuclear, October 09, 2011  . LBBB (left bundle branch block)     LBBB on EKG October 11, 2011,  no prior EKG has been done  . CAD (coronary artery disease)     Presumed CAD with nuclear scan October 09, 2011,  large anteroseptal MI and inferior MI. Catheterization scheduled October 15, 2011  . Anxiety     no med. in use for anxiety but pt. speaks openly of his stress & anxious feelings regarding impending surgery      Past Surgical History Past Surgical History  Procedure Laterality Date  . Tonsillectomy      as a child   . Colonoscopy    . Cardiac catheterization    . Abdominal aortic aneurysm repair      EVAR      Allergies/Intolerances Allergies  Allergen Reactions  . Codeine Hives    Current Home Medications     aspirin 325 MG tablet  Take 325 mg by mouth daily.     carvedilol 6.25 MG tablet  Commonly known as:  COREG  Take 1 tablet (6.25 mg total) by mouth 2 (two) times daily.     glipiZIDE 5 MG tablet  Commonly known as:  GLUCOTROL  Take 5 mg by mouth daily.     lisinopril 20 MG tablet  Commonly known as:  PRINIVIL,ZESTRIL  Take 1 tablet (20 mg total) by mouth 2 (two) times daily.     metFORMIN  500 MG tablet  Commonly known as:  GLUCOPHAGE  Take 2 tablets (1,000 mg total) by mouth 2 (two) times daily with a meal.     multivitamin tablet  Take 1 tablet by mouth daily.     simvastatin 40 MG tablet  Commonly known as:  ZOCOR  Take 40 mg by mouth at bedtime.     VIAGRA 50 MG tablet  Generic drug:  sildenafil  Take 50 mg by mouth daily as needed. For sexual activity      Family History Family History  Problem Relation Age of Onset  . Hypertension Mother   . Diabetes Sister   . Heart disease Sister   . Hypertension Sister      Social History Social History  . Marital Status: Married   Social History Main Topics  . Smoking status: Former Smoker    Types: Cigarettes    Quit date: 05/06/2004  . Smokeless tobacco: Never Used     Comment:  quit about 7 years ago  . Alcohol Use: 1.2 oz/week    2 Cans of beer per week     Comment: beer or wine  occassionally  . Drug Use: No   Review of Systems General: No chills, fever, night sweats or weight changes  Cardiovascular:  No chest pain, edema, orthopnea, palpitations, paroxysmal nocturnal dyspnea Dermatological: No rash, lesions or masses Respiratory: No cough, dyspnea Urologic: No hematuria, dysuria Abdominal: No nausea, vomiting, diarrhea, bright red blood per rectum, melena, or hematemesis Neurologic: No visual changes, changes in mental status All other systems reviewed and are otherwise negative except as noted above.  Physical Exam performed by Dr. Graciela Husbands Blood pressure 153/74, pulse 76, temperature 98.1 F (36.7 C), temperature source Oral, resp. rate 20, height 6\' 3"  (1.905 m), weight 180 lb 8 oz (81.874 kg), SpO2 97.00%.  General: Well developed, well nourished 67 year old man in no acute distress.  Head: Normocephalic, atraumatic, sclera non-icteric, no xanthomas, nares are without discharge.  Lymph Nodes: none  Back: without scoliosis/kyphosis, no CVA tendersness  Neck: Negative for carotid bruits. JVD not elevated.  Lungs: Clear bilaterally to auscultation without wheezes, rales, or rhonchi. Breathing is unlabored.  Heart:slow but regular with S1 S2. No murmur , rubs, or gallops appreciated.  Abdomen: Soft, non-tender, non-distended with normoactive bowel sounds. No hepatomegaly. No rebound/guarding. No obvious abdominal masses.  Msk: Strength and tone appear normal for age.  Extremities: No clubbing or cyanosis. No edema. Distal pedal pulses are 2+ and equal bilaterally.  Skin: Warm and Dry  Neuro: Alert and oriented X 3. CN III-XII intact Grossly normal sensory and motor function .  Psych: Responds to questions appropriately with a normal affect.   Labs Admission labs pending  Radiology/Studies Admission chest x-ray pending  12-lead ECG done in our office  today and reviewed by Dr. Graciela Husbands shows complete heart block with RBBB escape; prior 12-lead ECGs reviewed by Dr. Graciela Husbands show SR with LBBB   Assessment and Plan 1. Complete heart block 2. LV dysfunction 3. CAD 4. PVD 5. DM  Plan per Dr. Graciela Husbands - Mr. Vallin has intermittent complete heart block in the setting of ischemic and nonischemic cardiomyopathy with an ejection fraction of 30% It is not likely that this is related to the carvedilol as he had symptoms of lightheadedness and a low pulse at least one time prior to his cardiac evaluation/initiation of carvedilol. He will need his carvedilol for cardiomyopathy protection and so pacing is almost certainly going to be  required. We'll undertake repeat echo to look at left ventricular function as ICD implantation were also be probably appropriate in the event of need pacing in this situation CRT would be correct choice. We reviewed the potential benefits and risks of device implantation. As he is on carvedilol, we will plan to admit him and let him wash out his carvedilol. Clearly his situation is less stable than it has been as he has had protracted complete heart block this morning. We will be attached to transcutaneous pacing with implanted pacemaker tomorrow or Thursday.  We'll plan to resume beta blockers following device implantation. Consideration for other therapies including Aldactone will be appropriate.       Signed, Rick Duff, PA-C 07/14/2012, 3:15 PM

## 2012-07-15 ENCOUNTER — Encounter (HOSPITAL_COMMUNITY): Payer: Self-pay

## 2012-07-15 DIAGNOSIS — I442 Atrioventricular block, complete: Secondary | ICD-10-CM | POA: Diagnosis not present

## 2012-07-15 DIAGNOSIS — I517 Cardiomegaly: Secondary | ICD-10-CM

## 2012-07-15 DIAGNOSIS — I2510 Atherosclerotic heart disease of native coronary artery without angina pectoris: Secondary | ICD-10-CM | POA: Diagnosis not present

## 2012-07-15 DIAGNOSIS — I10 Essential (primary) hypertension: Secondary | ICD-10-CM | POA: Diagnosis not present

## 2012-07-15 DIAGNOSIS — I2589 Other forms of chronic ischemic heart disease: Secondary | ICD-10-CM | POA: Diagnosis not present

## 2012-07-15 LAB — BASIC METABOLIC PANEL
BUN: 24 mg/dL — ABNORMAL HIGH (ref 6–23)
CO2: 23 mEq/L (ref 19–32)
Calcium: 9.6 mg/dL (ref 8.4–10.5)
Chloride: 104 mEq/L (ref 96–112)
Creatinine, Ser: 1.12 mg/dL (ref 0.50–1.35)
GFR calc Af Amer: 78 mL/min — ABNORMAL LOW (ref >90)
GFR calc non Af Amer: 67 mL/min — ABNORMAL LOW (ref >90)
Glucose, Bld: 167 mg/dL — ABNORMAL HIGH (ref 70–99)
Potassium: 4.1 mEq/L (ref 3.5–5.1)
Sodium: 139 mEq/L (ref 135–145)

## 2012-07-15 LAB — GLUCOSE, CAPILLARY
Glucose-Capillary: 191 mg/dL — ABNORMAL HIGH (ref 70–99)
Glucose-Capillary: 283 mg/dL — ABNORMAL HIGH (ref 70–99)
Glucose-Capillary: 160 mg/dL — ABNORMAL HIGH (ref 70–99)
Glucose-Capillary: 115 mg/dL — ABNORMAL HIGH (ref 70–99)

## 2012-07-15 LAB — TSH: TSH: 2.868 u[IU]/mL (ref 0.350–4.500)

## 2012-07-15 LAB — TROPONIN I: Troponin I: <0.3 ng/mL (ref <0.30)

## 2012-07-15 MED ORDER — SODIUM CHLORIDE 0.9 % IV SOLN: 250.0000 mL | INTRAVENOUS | Status: DC

## 2012-07-15 MED ORDER — SODIUM CHLORIDE 0.9 % IJ SOLN: 3.0000 mL | INTRAMUSCULAR | Status: DC | PRN

## 2012-07-15 MED ORDER — CHLORHEXIDINE GLUCONATE 4 % EX LIQD: 60.0000 mL | Freq: Once | CUTANEOUS | Status: DC

## 2012-07-15 MED ORDER — CHLORHEXIDINE GLUCONATE 4 % EX LIQD
60.0000 mL | Freq: Once | CUTANEOUS | Status: DC
  Filled 2012-07-15: qty 60

## 2012-07-15 MED ORDER — GLIPIZIDE ER 5 MG PO TB24
5.0000 mg | ORAL_TABLET | Freq: Every day | ORAL | Status: DC
  Administered 2012-07-15 – 2012-07-17 (×2): 5 mg via ORAL
  Filled 2012-07-15 (×4): qty 1

## 2012-07-15 MED ORDER — SODIUM CHLORIDE 0.9 % IR SOLN
80.0000 mg | Status: DC
  Filled 2012-07-15: qty 2

## 2012-07-15 MED ORDER — LISINOPRIL 20 MG PO TABS
20.0000 mg | ORAL_TABLET | Freq: Every day | ORAL | Status: DC
  Administered 2012-07-15: 20 mg via ORAL
  Filled 2012-07-15 (×2): qty 1

## 2012-07-15 MED ORDER — CEFAZOLIN SODIUM-DEXTROSE 2-3 GM-% IV SOLR
2.0000 g | INTRAVENOUS | Status: DC
  Filled 2012-07-15: qty 50

## 2012-07-15 MED ORDER — SODIUM CHLORIDE 0.9 % IJ SOLN
3.0000 mL | Freq: Two times a day (BID) | INTRAMUSCULAR | Status: DC
  Administered 2012-07-16: 3 mL via INTRAVENOUS

## 2012-07-15 MED ORDER — SODIUM CHLORIDE 0.9 % IV SOLN
INTRAVENOUS | Status: DC
  Administered 2012-07-16: 06:00:00 via INTRAVENOUS

## 2012-07-15 MED ORDER — CHLORHEXIDINE GLUCONATE 4 % EX LIQD
60.0000 mL | Freq: Once | CUTANEOUS | Status: AC
  Administered 2012-07-16: 4 via TOPICAL
  Filled 2012-07-15: qty 60

## 2012-07-15 MED ORDER — CHLORHEXIDINE GLUCONATE 4 % EX LIQD
60.0000 mL | Freq: Once | CUTANEOUS | Status: AC
  Administered 2012-07-15: 4 via TOPICAL

## 2012-07-15 MED ORDER — METFORMIN HCL 500 MG PO TABS
500.0000 mg | ORAL_TABLET | Freq: Two times a day (BID) | ORAL | Status: DC
  Administered 2012-07-15: 500 mg via ORAL
  Filled 2012-07-15 (×4): qty 1

## 2012-07-15 MED ORDER — ASPIRIN 81 MG PO CHEW
81.0000 mg | CHEWABLE_TABLET | Freq: Every day | ORAL | Status: DC
  Administered 2012-07-15 – 2012-07-16 (×2): 81 mg via ORAL
  Filled 2012-07-15 (×3): qty 1

## 2012-07-15 NOTE — Care Management Note (Signed)
    Page 1 of 1   07/15/2012     8:35:44 AM   CARE MANAGEMENT NOTE 07/15/2012  Patient:  Luke [name], Luke [name]   Account Number:  [id number]  Date Initiated:  07/15/2012  Documentation initiated by:  Junius Creamer  Subjective/Objective Assessment:   adm w brady     Action/Plan:   lives w wife, pcp dr Crawford Givens   Anticipated DC Date:     Anticipated DC Plan:        DC Planning Services  CM consult      Choice offered to / List presented to:             Status of service:   Medicare Important Message given?   (If response is "NO", the following Medicare IM given date fields will be blank) Date Medicare IM given:   Date Additional Medicare IM given:    Discharge Disposition:    Per UR Regulation:  Reviewed for med. necessity/level of care/duration of stay  If discussed at Long Length of Stay Meetings, dates discussed:    Comments:  3/12 0835 debbie Gift Rueckert rn,bsn

## 2012-07-15 NOTE — Progress Notes (Signed)
Patient Name: Luke [name]      SUBJECTIVE: without  complaints  Past Medical History  Diagnosis Date  . Diabetes mellitus     type II  . PAD (peripheral artery disease)   . AAA (abdominal aortic aneurysm) 2011    Per vascular surgery  . HLD (hyperlipidemia)   . Hypertension     white coat HTN-- often elevated in office and controlled on outside checks.  . Low testosterone     Hx of  . Cardiomyopathy     Nuclear, October 09, 2011, EF 30%, multiple focal wall motion abnormalities  . Ejection fraction < 50%     EF 30%, nuclear, October 09, 2011  . LBBB (left bundle branch block)     LBBB on EKG October 11, 2011,  no prior EKG has been done  . CAD (coronary artery disease)     Presumed CAD with nuclear scan October 09, 2011,  large anteroseptal MI and inferior MI. Catheterization scheduled October 15, 2011  . Anxiety     no med. in use for anxiety but pt. speaks openly of his stress & anxious feelings regarding impending surgery      PHYSICAL EXAM Filed Vitals:   07/14/12 2346 07/15/12 0337 07/15/12 0348 07/15/12 0500  BP:  121/61    Pulse:      Temp: 97.5 F (36.4 C)  97.8 F (36.6 C)   TempSrc: Oral  Oral   Resp:  16    Height:      Weight:    170 lb 13.7 oz (77.5 kg)  SpO2: 92% 94%      Well developed and nourished in no acute distress HENT normal Neck supple with JVP-flat Carotids brisk and full without bruits Clear Regular rate and rhythm, no murmurs or gallops Abd-soft with active BS without hepatomegaly No Clubbing cyanosis edema Skin-warm and dry A & Oriented  Grossly normal sensory and motor function  TELEMETRY: Reviewed telemetry pt in nsr with LBBB    Intake/Output Summary (Last 24 hours) at 07/15/12 0749 Last data filed at 07/14/12 2300  Gross per 24 hour  Intake      0 ml  Output   2100 ml  Net  -2100 ml    LABS: Basic Metabolic Panel:  Recent Labs Lab 07/14/12 1502 07/14/12 1633 07/15/12 0335  NA 134* 138 139  K 4.5 4.2 4.1  CL 100 103 104    CO2 23 22 23   GLUCOSE 241* 147* 167*  BUN 29* 27* 24*  CREATININE 1.12 1.02 1.12  CALCIUM 10.2 10.2 9.6  MG  --  2.0  --    Cardiac Enzymes:  Recent Labs  07/14/12 1633 07/14/12 2120 07/15/12 0335  TROPONINI <0.30 <0.30 <0.30   CBC:  Recent Labs Lab 07/14/12 1502  WBC 11.0*  NEUTROABS 7.8*  HGB 13.9  HCT 38.8*  MCV 92.2  PLT 260   PROTIME: No results found for this basename: LABPROT, INR,  in the last 72 hours Liver Function Tests:  Recent Labs  07/14/12 1633  AST 15  ALT 16  ALKPHOS 56  BILITOT 0.4  PROT 7.4  ALBUMIN 4.0   Thyroid Function Tests:  Recent Labs  07/14/12 1633  TSH 2.868  i    ASSESSMENT AND PLAN: Active Problems:   CAD (coronary artery disease)   Ischemic cardiomyopathy   LBBB (left bundle branch block)   Complete heart block  Pt with no overnight CHB  Will follow,  And will recheck  LV function  If remains low will proceed with primary prevention icd in am with CRT given the above  Have reviewed the potential benefits and risks of ICD implantation including but not limited to death, perforation of heart or lung, lead dislodgement, infection,  device malfunction and inappropriate shocks.  The patient express understanding  and are willing to proceed.     Signed, Sherryl Manges MD  07/15/2012

## 2012-07-15 NOTE — Progress Notes (Signed)
Inpatient Diabetes Program Recommendations  AACE/ADA: New Consensus Statement on Inpatient Glycemic Control (2013)  Target Ranges:  Prepandial:   less than 140 mg/dL      Peak postprandial:   less than 180 mg/dL (1-2 hours)      Critically ill patients:  140 - 180 mg/dL  Results for EINAR, NOLASCO (MRN [id number]) as of 07/15/2012 13:50  Ref. Range 07/14/2012 21:36 07/15/2012 08:24 07/15/2012 12:33  Glucose-Capillary Latest Range: 70-99 mg/dL 295 (H) 284 (H) 132 (H)   Inpatient Diabetes Program Recommendations Correction (SSI): start moderate correction scale Q 4 while NPO and TID + HS when eating Thank you  Piedad Climes BSN, RN,CDE Inpatient Diabetes Coordinator [phone] (team pager)

## 2012-07-15 NOTE — Progress Notes (Signed)
  Echocardiogram 2D Echocardiogram has been performed.  Justen Fonda 07/15/2012, 10:34 AM

## 2012-07-16 ENCOUNTER — Encounter (HOSPITAL_COMMUNITY)
Admission: EM | Disposition: A | Discharge status: Home / Self Care | Payer: Self-pay | Source: Home / Self Care | Attending: Internal Medicine

## 2012-07-16 DIAGNOSIS — I2589 Other forms of chronic ischemic heart disease: Secondary | ICD-10-CM

## 2012-07-16 DIAGNOSIS — I2510 Atherosclerotic heart disease of native coronary artery without angina pectoris: Secondary | ICD-10-CM | POA: Diagnosis not present

## 2012-07-16 DIAGNOSIS — I10 Essential (primary) hypertension: Secondary | ICD-10-CM | POA: Diagnosis not present

## 2012-07-16 DIAGNOSIS — I509 Heart failure, unspecified: Secondary | ICD-10-CM

## 2012-07-16 DIAGNOSIS — I442 Atrioventricular block, complete: Secondary | ICD-10-CM | POA: Diagnosis not present

## 2012-07-16 HISTORY — PX: PACEMAKER INSERTION: SHX728

## 2012-07-16 HISTORY — PX: BI-VENTRICULAR IMPLANTABLE CARDIOVERTER DEFIBRILLATOR: SHX5459

## 2012-07-16 LAB — GLUCOSE, CAPILLARY
Glucose-Capillary: 171 mg/dL — ABNORMAL HIGH (ref 70–99)
Glucose-Capillary: 175 mg/dL — ABNORMAL HIGH (ref 70–99)
Glucose-Capillary: 115 mg/dL — ABNORMAL HIGH (ref 70–99)
Glucose-Capillary: 115 mg/dL — ABNORMAL HIGH (ref 70–99)
Glucose-Capillary: 188 mg/dL — ABNORMAL HIGH (ref 70–99)

## 2012-07-16 SURGERY — BI-VENTRICULAR IMPLANTABLE CARDIOVERTER DEFIBRILLATOR  (CRT-D)
Anesthesia: LOCAL

## 2012-07-16 MED ORDER — LISINOPRIL 20 MG PO TABS
20.0000 mg | ORAL_TABLET | Freq: Two times a day (BID) | ORAL | Status: DC
  Administered 2012-07-16 – 2012-07-17 (×2): 20 mg via ORAL
  Filled 2012-07-16 (×4): qty 1

## 2012-07-16 MED ORDER — ONDANSETRON HCL 4 MG/2ML IJ SOLN: 4.0000 mg | Freq: Four times a day (QID) | INTRAMUSCULAR | Status: DC | PRN

## 2012-07-16 MED ORDER — CEFAZOLIN SODIUM-DEXTROSE 2-3 GM-% IV SOLR
2.0000 g | Freq: Once | INTRAVENOUS | Status: DC
  Filled 2012-07-16: qty 50

## 2012-07-16 MED ORDER — SODIUM CHLORIDE 0.45 % IV SOLN: INTRAVENOUS | Status: DC

## 2012-07-16 MED ORDER — SODIUM CHLORIDE 0.9 % IJ SOLN
3.0000 mL | Freq: Two times a day (BID) | INTRAMUSCULAR | Status: DC
Start: 2012-07-16 — End: 2012-07-16

## 2012-07-16 MED ORDER — ACETAMINOPHEN 325 MG PO TABS: 325.0000 mg | ORAL_TABLET | ORAL | Status: DC | PRN

## 2012-07-16 MED ORDER — CEFAZOLIN SODIUM 1-5 GM-% IV SOLN
1.0000 g | Freq: Four times a day (QID) | INTRAVENOUS | Status: AC
  Administered 2012-07-16 – 2012-07-17 (×3): 1 g via INTRAVENOUS
  Filled 2012-07-16 (×3): qty 50

## 2012-07-16 MED ORDER — INSULIN ASPART 100 UNIT/ML ~~LOC~~ SOLN
0.0000 [IU] | Freq: Three times a day (TID) | SUBCUTANEOUS | Status: DC
  Administered 2012-07-17: 09:00:00 2 [IU] via SUBCUTANEOUS

## 2012-07-16 MED ORDER — HEPARIN (PORCINE) IN NACL 2-0.9 UNIT/ML-% IJ SOLN
INTRAMUSCULAR | Status: AC
  Filled 2012-07-16: qty 500

## 2012-07-16 MED ORDER — SODIUM CHLORIDE 0.9 % IJ SOLN: 3.0000 mL | INTRAMUSCULAR | Status: DC | PRN

## 2012-07-16 MED ORDER — MIDAZOLAM HCL 5 MG/5ML IJ SOLN
INTRAMUSCULAR | Status: AC
  Filled 2012-07-16: qty 5

## 2012-07-16 MED ORDER — SODIUM CHLORIDE 0.9 % IV SOLN: 250.0000 mL | INTRAVENOUS | Status: DC

## 2012-07-16 MED ORDER — LIDOCAINE HCL (PF) 1 % IJ SOLN
INTRAMUSCULAR | Status: AC
  Filled 2012-07-16: qty 60

## 2012-07-16 MED ORDER — FENTANYL CITRATE 0.05 MG/ML IJ SOLN
INTRAMUSCULAR | Status: AC
  Filled 2012-07-16: qty 2

## 2012-07-16 MED ORDER — SIMVASTATIN 40 MG PO TABS
40.0000 mg | ORAL_TABLET | Freq: Every day | ORAL | Status: DC
  Administered 2012-07-16: 22:00:00 40 mg via ORAL
  Filled 2012-07-16 (×2): qty 1

## 2012-07-16 MED ORDER — CARVEDILOL 12.5 MG PO TABS
12.5000 mg | ORAL_TABLET | Freq: Two times a day (BID) | ORAL | Status: DC
  Administered 2012-07-17: 05:00:00 12.5 mg via ORAL
  Filled 2012-07-16 (×3): qty 1

## 2012-07-16 MED ORDER — SODIUM CHLORIDE 0.9 % IV SOLN: INTRAVENOUS | Status: AC

## 2012-07-16 MED ORDER — SODIUM CHLORIDE 0.9 % IR SOLN
Status: DC
  Filled 2012-07-16: qty 2

## 2012-07-16 NOTE — CV Procedure (Signed)
WARDEN BUFFA [id number]  [id number]  Preop Dx: LBBB; ICM, intermittent complete heart block Postop Dx same/   Procedure: Dual chmamber ICD with LV lead placement  Cx: None   Dictation number [id number]  Sherryl Manges, MD 07/16/2012 4:51 PM

## 2012-07-16 NOTE — Progress Notes (Signed)
     Patient: Luke [name] Date of Encounter: 07/16/2012, 7:20 AM Admit date: 07/14/2012     Subjective  Luke [name] has no complaints this AM.    Objective  Physical Exam: Vitals: BP 140/75  Pulse 72  Temp(Src) 98.3 F (36.8 C) (Oral)  Resp 15  Ht 6\' 3"  (1.905 m)  Wt 169 lb 15.6 oz (77.1 kg)  BMI 21.25 kg/m2  SpO2 92% General: Well developed, well appearing 67 year old male in no acute distress. Neck: Supple. JVD not elevated. Lungs: Clear bilaterally to auscultation without wheezes, rales, or rhonchi. Breathing is unlabored. Heart: RRR S1 S2 without murmurs, rubs, or gallops.  Abdomen: Soft, non-distended. Extremities: No clubbing or cyanosis. No edema.  Distal pedal pulses are 2+ and equal bilaterally. Neuro: Alert and oriented X 3. Moves all extremities spontaneously. No focal deficits.  Intake/Output:  Intake/Output Summary (Last 24 hours) at 07/16/12 0720 Last data filed at 07/15/12 1856  Gross per 24 hour  Intake    480 ml  Output   1500 ml  Net  -1020 ml    Inpatient Medications:  . aspirin  81 mg Oral Daily  .  ceFAZolin (ANCEF) IV  2 g Intravenous On Call  . chlorhexidine  60 mL Topical Once  . chlorhexidine  60 mL Topical Once  . gentamicin irrigation  80 mg Irrigation On Call  . glipiZIDE  5 mg Oral Q breakfast  . lisinopril  20 mg Oral Daily  . metFORMIN  500 mg Oral BID WC  . sodium chloride  3 mL Intravenous Q12H   . sodium chloride    . sodium chloride 50 mL/hr at 07/16/12 0616    Labs:  Recent Labs  07/14/12 1633 07/15/12 0335  NA 138 139  K 4.2 4.1  CL 103 104  CO2 22 23  GLUCOSE 147* 167*  BUN 27* 24*  CREATININE 1.02 1.12  CALCIUM 10.2 9.6  MG 2.0  --     Recent Labs  07/14/12 1633  AST 15  ALT 16  ALKPHOS 56  BILITOT 0.4  PROT 7.4  ALBUMIN 4.0    Recent Labs  07/14/12 1502  WBC 11.0*  NEUTROABS 7.8*  HGB 13.9  HCT 38.8*  MCV 92.2  PLT 260    Recent Labs  07/14/12 1633 07/14/12 2120 07/15/12 0335    TROPONINI <0.30 <0.30 <0.30    Recent Labs  07/14/12 1633  TSH 2.868    Radiology/Studies:  Echocardiogram: Study Conclusions - Left ventricle: The cavity size was moderately dilated. Wall thickness was normal. Systolic function was moderately to severely reduced. The estimated ejection fraction was in the range of 30% to 35%. Severe diffuse hypokinesis. There was fusion of early and atrial contributions to ventricular filling. - Left atrium: The atrium was mildly dilated. - Pulmonary arteries: Systolic pressure was mildly increased. PA peak pressure: 43mm Hg (S).   Telemetry: SR; no further AV block in last 24 hours    Assessment and Plan  1. Complete heart block  2. LV dysfunction  3. CAD  4. PVD  5. DM For BiV ICD today. Dr. Graciela Husbands to see.  Signed, Mame Twombly PA-C

## 2012-07-16 NOTE — Interval H&P Note (Signed)
History and Physical Interval Note:  07/16/2012 11:37 AM  Luke [name]  has presented today for surgery, with the diagnosis of Heart failure  The various methods of treatment have been discussed with the patient and family. After consideration of risks, benefits and other options for treatment, the patient has consented to  Procedure(s): BI-VENTRICULAR IMPLANTABLE CARDIOVERTER DEFIBRILLATOR  (CRT-D) (N/A) as a surgical intervention .  The patient's history has been reviewed, patient examined, no change in status, stable for surgery.  I have reviewed the patient's chart and labs.  Questions were answered to the patient's satisfaction.     Sherryl Manges

## 2012-07-17 ENCOUNTER — Inpatient Hospital Stay (HOSPITAL_COMMUNITY): Payer: Medicare Other

## 2012-07-17 DIAGNOSIS — I2510 Atherosclerotic heart disease of native coronary artery without angina pectoris: Secondary | ICD-10-CM | POA: Diagnosis not present

## 2012-07-17 DIAGNOSIS — I10 Essential (primary) hypertension: Secondary | ICD-10-CM | POA: Diagnosis not present

## 2012-07-17 DIAGNOSIS — Z452 Encounter for adjustment and management of vascular access device: Secondary | ICD-10-CM | POA: Diagnosis not present

## 2012-07-17 DIAGNOSIS — I2589 Other forms of chronic ischemic heart disease: Secondary | ICD-10-CM | POA: Diagnosis not present

## 2012-07-17 DIAGNOSIS — I442 Atrioventricular block, complete: Secondary | ICD-10-CM | POA: Diagnosis not present

## 2012-07-17 LAB — GLUCOSE, CAPILLARY
Glucose-Capillary: 180 mg/dL — ABNORMAL HIGH (ref 70–99)
Glucose-Capillary: 215 mg/dL — ABNORMAL HIGH (ref 70–99)

## 2012-07-17 MED ORDER — ASPIRIN 81 MG PO TABS: 81.0000 mg | ORAL_TABLET | Freq: Every day | ORAL | Status: DC

## 2012-07-17 MED ORDER — CARVEDILOL 6.25 MG PO TABS: 12.5000 mg | ORAL_TABLET | Freq: Two times a day (BID) | ORAL | Status: DC

## 2012-07-17 MED ORDER — YOU HAVE A PACEMAKER BOOK
Freq: Once | Status: AC
  Administered 2012-07-17: 05:00:00
  Filled 2012-07-17: qty 1

## 2012-07-17 MED ORDER — METFORMIN HCL 500 MG PO TABS: 1000.0000 mg | ORAL_TABLET | Freq: Two times a day (BID) | ORAL | Status: DC

## 2012-07-17 NOTE — Op Note (Signed)
NAME:  KOA, ZOELLER NO.:  [id number]  MEDICAL RECORD NO.:  [id number]  LOCATION:  6533                         FACILITY:  [facility]  PHYSICIAN:  Duke Salvia, MD, FACCDATE OF BIRTH:  03-Nov-1945  DATE OF PROCEDURE:  07/16/2012 DATE OF DISCHARGE:                              OPERATIVE REPORT   PREOPERATIVE DIAGNOSES:  Ischemic cardiomyopathy with left bundle-branch block.  Failed beta-blocker therapy because of intermittent complete heart block.  Class I-II congestive failure with an ejection fraction of 30%.  POSTOPERATIVE DIAGNOSES:  Ischemic cardiomyopathy with left bundle- branch block.  Failed beta-blocker therapy because of intermittent complete heart block.  Class I-II congestive failure with an ejection fraction of 30%.  PROCEDURE:  Dual-chamber defibrillator implantation with left ventricular lead placement (MADIT-CRT).  DESCRIPTION OF PROCEDURE:  Following obtaining informed consent, the patient was brought to the Electrophysiology Laboratory and placed on the fluoroscopic table in supine position.  After routine prep and drape of the left upper chest, lidocaine was infiltrated in the prepectoral subclavicular region.  Incision was made and carried down to layer of the prepectoral fascia using electrocautery and sharp dissection.  A pocket was formed similarly.  Hemostasis was obtained.  Thereafter attention was turned to gain access to the extrathoracic left subclavian vein, which was accomplished without difficulty without the aspiration of air or puncture of the artery.  Three separate venipunctures were accomplished, and sequentially, an 8-French, 9.5- [place] and 7-French sheaths were placed, which were passed a St. Jude Durata [id number] lead, serial number [id number] and Medtronic coronary sinus cannulation catheter (see below), and a St. Jude [id number] active fixation atrial lead, serial number [id number].  Under fluoroscopic guidance, the RV lead  was manipulated the right ventricular apex were it was deployed and the R-wave was 13 with a pacing impedance of 623, threshold of 0.9 volts at 0.5 milliseconds, current at threshold was 1.5 mA.  There was no diaphragmatic pacing at 10 Volts and the current of injury was moderate.  The leads were secured to the prepectoral fascia.  We then turned our attention to gain access to the coronary sinus.  This turned out to be quite difficult.  I ended up using three different sheaths with the multipurpose gaining access to the coronary sinus.  A venogram demonstrated a high lateral branch, which we then able to cannulate with a Whisper wire, and over which, we deployed a St. Jude [id number] lead, serial number [id number] to a point at the junction between the mid and distal third.  Using the second and third electrode, the LV amplitude was 2-3 millivolts with a pacing impedance that was not recorded initially and we had a threshold 1 volt at 0.5 milliseconds. There was no diaphragmatic pacing at 10 volts.  The 9.5-French sheath was removed, at which time, the atrial lead described above was deployed to the right atrial appendage.  The impedance was 611 with a threshold of 1 volt at 0.5, the amplitude was 3.8, current of threshold was 1.7 mA, and there was no diaphragmatic pacing at 10 volts.  The current of injury was brisk.  This lead was secured to the prepectoral fascia and then the LV deployment system  was removed and the leads were attached to a [employer]. Jude [employer] defibrillator, serial number [id number].  Through the device, the bipolar P-wave was 3.6 with a pacing impedance of 630, threshold of 0.75 at 0.5.  The R-wave was 11.9 with a pacing impedance of 540 with a threshold of 1 volt at 0.5.  The LV impedance was 780 with a threshold of 1.25 at 0.5, again, pulse 2 and 3.  High-voltage impedance was 84 ohms.  With these acceptable parameters recorded, the leads and the device were placed in  the pocket and secured to the prepectoral fascia, and the wound was then closed in 2 layers in normal fashion.  The wound was washed, dried, and a benzoin and Steri-Strip dressing was applied.  Needle counts, sponge counts, and instrument counts were correct at the end of the procedure according to staff.  The patient tolerated the procedure without apparent complication.     Duke Salvia, MD, [facility]     SCK/MEDQ  D:  07/16/2012  T:  07/17/2012  Job:  [id number]

## 2012-07-17 NOTE — Discharge Summary (Signed)
ELECTROPHYSIOLOGY PROCEDURE DISCHARGE SUMMARY    Patient ID: Luke [name],  MRN: [id number], DOB/AGE: 1946-04-14 67 y.o.  Admit date: 07/14/2012 Discharge date: 07/17/2012  Primary Care Physician: Crawford Givens, MD Primary Cardiologist: Jerral Bonito, MD  Primary Discharge Diagnosis:  Complete heart block, ischemic cardiomyopathy, LBBB status post CRT-D this admission  Secondary Discharge Diagnosis:  1.  Coronary artery disease 2.  Type II diabetes 3.  Peripheral artery disease 4.  Hyperlipidemia 5.  Hypertension  Procedures This Admission:  1.  Echocardiogram 07-15-2012 demonstrated an EF of 30-35% with severe diffuse hypokinesis.  Mildly dilated LA 2.  Implantation of a CRTD on 07-16-2012 by Dr Graciela Husbands.  The patient received a STJ Quadra Assura ICD with model number 2088 right atrial lead, 7122Q right ventricular lead and model number 1458Q left ventricular lead.  DFTs were not evaluated at implant.  There were no early apparent complications. 3. CXR on 07-17-2012 demonstrated stable lead position and no pneumothorax status post device implant per Dr Odessa Fleming review.   Brief HPI: Luke [name] is a 67 year old man seen by Dr. Graciela Husbands on 07-14-2012 as an add-on following a call stating his heart rate, normally in the 70s, was now in the 30s. His prior 12-lead ECG was noted to have left bundle branch block. He was advised to come in to the office for evaluation. He was found to be in complete heart block. He has mild exercise intolerance. He has known diffuse vascular disease. He underwent aortic stent graft placement last year. Prior to that he underwent cardiac pre-op evaluation. This demonstrated an abnormal stress nuclear study with evidence of a previous large anteroseptal MI and inferoseptal MI. The LV is markedly dilated with global hypokinesis. EF 30%. There is severe hypokinesis in the anteroseptal and inferoseptal walls. He underwent catheterization which confirmed severe LV  dysfunction with moderate to severe 2 vessel disease not thought to be appropriate for intervention.  He has been on carvedilol for the last 6 or 8 months. He did have one of these episodes prior to carvedilol initiation. He describes this as being associated with presyncope and weakness. He was admitted for further evaluation.   Hospital Course:  The patient was admitted and placed on telemetry.  His Coreg was held.  Because of his intermittent complete heart block, persistent cardiomyopathy and LBBB and need for beta blockers, it was felt that CRTD implant was appropriate.  Risks, benefits, and alternatives were discussed with the patient who wished to proceed.  This was carried out on 07-16-2012 with details as outlined above.  He was monitored on telemetry overnight which demonstrated sinus rhythm with ventricular pacing.  Dr Graciela Husbands examined the patient on 07-17-2012 and considered him stable for discharge to home.   Discharge Vitals: Blood pressure 138/78, pulse 71, temperature 97.9 F (36.6 C), temperature source Oral, resp. rate 18, height 6\' 3"  (1.905 m), weight 174 lb 13.2 oz (79.3 kg), SpO2 95.00%.    Labs:   Lab Results  Component Value Date   WBC 11.0* 07/14/2012   HGB 13.9 07/14/2012   HCT 38.8* 07/14/2012   MCV 92.2 07/14/2012   PLT 260 07/14/2012     Recent Labs Lab 07/14/12 1633 07/15/12 0335  NA 138 139  K 4.2 4.1  CL 103 104  CO2 22 23  BUN 27* 24*  CREATININE 1.02 1.12  CALCIUM 10.2 9.6  PROT 7.4  --   BILITOT 0.4  --   ALKPHOS 56  --  ALT 16  --   AST 15  --   GLUCOSE 147* 167*   Lab Results  Component Value Date   TROPONINI <0.30 07/15/2012     Discharge Medications:    Medication List    TAKE these medications       aspirin 81 MG tablet  Take 1 tablet (81 mg total) by mouth daily.     carvedilol 6.25 MG tablet  Commonly known as:  COREG  Take 2 tablets (12.5 mg total) by mouth 2 (two) times daily.     glipiZIDE 5 MG tablet  Commonly known as:   GLUCOTROL  Take 5 mg by mouth daily.     lisinopril 20 MG tablet  Commonly known as:  PRINIVIL,ZESTRIL  Take 1 tablet (20 mg total) by mouth 2 (two) times daily.     metFORMIN 500 MG tablet  Commonly known as:  GLUCOPHAGE  Take 2 tablets (1,000 mg total) by mouth 2 (two) times daily with a meal. HOLD for 2 days, then resume usual dose on 07/19/2012     multivitamin tablet  Take 1 tablet by mouth daily.     simvastatin 40 MG tablet  Commonly known as:  ZOCOR  Take 40 mg by mouth at bedtime.     VIAGRA 50 MG tablet  Generic drug:  sildenafil  Take 50 mg by mouth daily as needed. For sexual activity        Disposition:  Discharge Orders   Future Appointments Provider Department Dept Phone   07/30/2012 9:30 AM [facility] [employer] Main Office [place]) [phone]   08/06/2012 10:45 AM Luis Abed, MD [facility] Main Office [place]) [phone]   10/22/2012 2:15 PM Duke Salvia, MD [facility] Main Office [place]) [phone]   12/30/2012 9:00 AM [facility] [phone]   Eat a light meal the night before the exam but please avoid gaseous foods.   Nothing to eat or drink for at least 8 hours prior to the exam. No gum chewing or smoking the morning of the exam. Please take your morning medications with small sips of water, especially blood pressure medication. If you have several vascular lab exams and will see physician, please bring a snack with you.   12/30/2012 9:30 AM [name], MD [facility] [phone]   Future Orders Complete By Expires     Diet - low sodium heart healthy  As directed     Discharge instructions  As directed     Comments:      Please see post device implant discharge instructions    Increase activity slowly  As directed       Follow-up Information   Follow up with [facility] On 07/30/2012. (At 9:30 AM for wound check)     Contact information:   [address] [place] [address] [phone]      Follow up with Sherryl Manges, MD On 10/22/2012. (At 2:15 PM)    Contact information:   [address] [place] [address] [phone]      Follow up with Willa Rough, MD On 08/06/2012. (At 10:45 AM)    Contact information:   [address] [place] [address] [phone]      Duration of Discharge Encounter: Greater than 30 minutes including physician time.  Signed, Gypsy Balsam, RN, BSN 07/17/2012, 2:16 PM

## 2012-07-17 NOTE — Progress Notes (Addendum)
   ELECTROPHYSIOLOGY ROUNDING NOTE    Patient Name: Luke [name] Date of Encounter: 07-17-2012    SUBJECTIVE:Patient feels well.  No chest pain or shortness of breath.  S/p CRTD implant 07-16-2012.  Minimal incisional soreness.   TELEMETRY: Reviewed telemetry pt in sinus rhythm with ventricular pacing Filed Vitals:   07/17/12 0035 07/17/12 0430 07/17/12 0435 07/17/12 0445  BP: 146/86 164/88 161/81 186/93  Pulse: 75 75    Temp: 98.1 F (36.7 C) 98.2 F (36.8 C)    TempSrc: Oral Oral    Resp: 18 17    Height:      Weight: 174 lb 13.2 oz (79.3 kg)     SpO2: 96% 94%      Intake/Output Summary (Last 24 hours) at 07/17/12 1610 Last data filed at 07/16/12 2000  Gross per 24 hour  Intake  36.67 ml  Output    500 ml  Net -463.33 ml    LABS: Basic Metabolic Panel:  Recent Labs  96/04/54 1633 07/15/12 0335  NA 138 139  K 4.2 4.1  CL 103 104  CO2 22 23  GLUCOSE 147* 167*  BUN 27* 24*  CREATININE 1.02 1.12  CALCIUM 10.2 9.6  MG 2.0  --    Liver Function Tests:  Recent Labs  07/14/12 1633  AST 15  ALT 16  ALKPHOS 56  BILITOT 0.4  PROT 7.4  ALBUMIN 4.0   CBC:  Recent Labs  07/14/12 1502  WBC 11.0*  NEUTROABS 7.8*  HGB 13.9  HCT 38.8*  MCV 92.2  PLT 260   Radiology/Studies:  Pending  PHYSICAL EXAM Left chest without hematoma or ecchymosis.  BP 138/78  Pulse 71  Temp(Src) 97.9 F (36.6 C) (Oral)  Resp 18  Ht 6\' 3"  (1.905 m)  Wt 174 lb 13.2 oz (79.3 kg)  BMI 21.85 kg/m2  SpO2 95% Well developed and nourished in no acute distress HENT normal Neck supple   Clear Regular rate and rhythm, no murmurs or gallops Abd-soft with active BS No Clubbing cyanosis edema Skin-warm and dry A & Oriented  Grossly normal sensory and motor function   DEVICE INTERROGATION: Device interrogation pending.    EKG: sinus rhythm with ventricular pacing, QRS 130  Wound care, arm mobility, restrictions reviewed with patient.  Routine follow up scheduled.    Has appt with JK in 4 weeks   Questions answered D/c on coreg 12.5 bid Decrease ASa 81

## 2012-07-20 ENCOUNTER — Telehealth: Payer: Self-pay | Admitting: *Deleted

## 2012-07-20 NOTE — Telephone Encounter (Signed)
TCM patient. Called to check status. Patient states he is feeling well. Taking all medications as ordered. Will start walking again next week on treadmill. Reminded him no heavy lifting and no playing golf for now. He states that wound looks fine without any discoloration or drainage. Aware of appointment for wound check next week.

## 2012-07-30 ENCOUNTER — Encounter: Payer: Self-pay | Admitting: Internal Medicine

## 2012-07-30 ENCOUNTER — Other Ambulatory Visit: Payer: Self-pay

## 2012-07-30 ENCOUNTER — Ambulatory Visit (INDEPENDENT_AMBULATORY_CARE_PROVIDER_SITE_OTHER): Payer: Medicare Other | Admitting: *Deleted

## 2012-07-30 DIAGNOSIS — I509 Heart failure, unspecified: Secondary | ICD-10-CM | POA: Diagnosis not present

## 2012-07-30 DIAGNOSIS — I255 Ischemic cardiomyopathy: Secondary | ICD-10-CM

## 2012-07-30 DIAGNOSIS — I2589 Other forms of chronic ischemic heart disease: Secondary | ICD-10-CM | POA: Diagnosis not present

## 2012-07-30 DIAGNOSIS — I442 Atrioventricular block, complete: Secondary | ICD-10-CM

## 2012-07-30 LAB — ICD DEVICE OBSERVATION
AL AMPLITUDE: 3.3 mv
AL AMPLITUDE: 3.3 mv
AL AMPLITUDE: 3.3 mv
AL AMPLITUDE: 3.3 mv
AL AMPLITUDE: 3.3 mv
AL AMPLITUDE: 3.3 mv
AL AMPLITUDE: 3.3 mv
AL AMPLITUDE: 3.3 mv
AL AMPLITUDE: 3.3 mv
AL AMPLITUDE: 3.3 mv
AL IMPEDENCE ICD: 450 Ohm
AL IMPEDENCE ICD: 450 Ohm
AL IMPEDENCE ICD: 450 Ohm
AL IMPEDENCE ICD: 450 Ohm
AL IMPEDENCE ICD: 450 Ohm
AL IMPEDENCE ICD: 450 Ohm
AL IMPEDENCE ICD: 450 Ohm
AL IMPEDENCE ICD: 450 Ohm
AL IMPEDENCE ICD: 450 Ohm
AL IMPEDENCE ICD: 450 Ohm
AL THRESHOLD: 0.75 V
AL THRESHOLD: 0.75 V
AL THRESHOLD: 0.75 V
AL THRESHOLD: 0.75 V
AL THRESHOLD: 0.75 V
AL THRESHOLD: 0.75 V
AL THRESHOLD: 0.75 V
AL THRESHOLD: 0.75 V
AL THRESHOLD: 0.75 V
AL THRESHOLD: 0.75 V
ATRIAL PACING ICD: 17 pct
ATRIAL PACING ICD: 17 pct
ATRIAL PACING ICD: 17 pct
ATRIAL PACING ICD: 17 pct
ATRIAL PACING ICD: 17 pct
ATRIAL PACING ICD: 17 pct
ATRIAL PACING ICD: 17 pct
ATRIAL PACING ICD: 17 pct
ATRIAL PACING ICD: 17 pct
ATRIAL PACING ICD: 17 pct
BAMS-0001: 150 {beats}/min
BAMS-0001: 150 {beats}/min
BAMS-0001: 150 {beats}/min
BAMS-0001: 150 {beats}/min
BAMS-0001: 150 {beats}/min
BAMS-0001: 150 {beats}/min
BAMS-0001: 150 {beats}/min
BAMS-0001: 150 {beats}/min
BAMS-0001: 150 {beats}/min
BAMS-0001: 150 {beats}/min
BAMS-0003: 70 {beats}/min
BAMS-0003: 70 {beats}/min
BAMS-0003: 70 {beats}/min
BAMS-0003: 70 {beats}/min
BAMS-0003: 70 {beats}/min
BAMS-0003: 70 {beats}/min
BAMS-0003: 70 {beats}/min
BAMS-0003: 70 {beats}/min
BAMS-0003: 70 {beats}/min
BAMS-0003: 70 {beats}/min
DEV-0020ICD: NEGATIVE
DEV-0020ICD: NEGATIVE
DEV-0020ICD: NEGATIVE
DEV-0020ICD: NEGATIVE
DEV-0020ICD: NEGATIVE
DEV-0020ICD: NEGATIVE
DEV-0020ICD: NEGATIVE
DEV-0020ICD: NEGATIVE
DEV-0020ICD: NEGATIVE
DEV-0020ICD: NEGATIVE
DEVICE MODEL ICD: 7097007
DEVICE MODEL ICD: 7097007
DEVICE MODEL ICD: 7097007
DEVICE MODEL ICD: 7097007
DEVICE MODEL ICD: 7097007
DEVICE MODEL ICD: 7097007
DEVICE MODEL ICD: 7097007
DEVICE MODEL ICD: 7097007
DEVICE MODEL ICD: 7097007
DEVICE MODEL ICD: 7097007
FVT: 0
FVT: 0
FVT: 0
FVT: 0
FVT: 0
FVT: 0
FVT: 0
FVT: 0
FVT: 0
FVT: 0
HV IMPEDENCE: 65 Ohm
HV IMPEDENCE: 65 Ohm
HV IMPEDENCE: 65 Ohm
HV IMPEDENCE: 65 Ohm
HV IMPEDENCE: 65 Ohm
HV IMPEDENCE: 65 Ohm
HV IMPEDENCE: 65 Ohm
HV IMPEDENCE: 65 Ohm
HV IMPEDENCE: 65 Ohm
HV IMPEDENCE: 65 Ohm
LV LEAD IMPEDENCE ICD: 825 Ohm
LV LEAD IMPEDENCE ICD: 825 Ohm
LV LEAD IMPEDENCE ICD: 825 Ohm
LV LEAD IMPEDENCE ICD: 825 Ohm
LV LEAD IMPEDENCE ICD: 825 Ohm
LV LEAD IMPEDENCE ICD: 825 Ohm
LV LEAD IMPEDENCE ICD: 825 Ohm
LV LEAD IMPEDENCE ICD: 825 Ohm
LV LEAD IMPEDENCE ICD: 825 Ohm
LV LEAD IMPEDENCE ICD: 825 Ohm
LV LEAD THRESHOLD: 1.5 V
LV LEAD THRESHOLD: 1.5 V
LV LEAD THRESHOLD: 1.5 V
LV LEAD THRESHOLD: 1.5 V
LV LEAD THRESHOLD: 1.5 V
LV LEAD THRESHOLD: 1.5 V
LV LEAD THRESHOLD: 1.5 V
LV LEAD THRESHOLD: 1.5 V
LV LEAD THRESHOLD: 1.5 V
LV LEAD THRESHOLD: 1.5 V
MODE SWITCH EPISODES: 0
MODE SWITCH EPISODES: 0
MODE SWITCH EPISODES: 0
MODE SWITCH EPISODES: 0
MODE SWITCH EPISODES: 0
MODE SWITCH EPISODES: 0
MODE SWITCH EPISODES: 0
MODE SWITCH EPISODES: 0
MODE SWITCH EPISODES: 0
MODE SWITCH EPISODES: 0
PACEART VT: 0
PACEART VT: 0
PACEART VT: 0
PACEART VT: 0
PACEART VT: 0
PACEART VT: 0
PACEART VT: 0
PACEART VT: 0
PACEART VT: 0
PACEART VT: 0
RV LEAD AMPLITUDE: 12 mv
RV LEAD AMPLITUDE: 12 mv
RV LEAD AMPLITUDE: 12 mv
RV LEAD AMPLITUDE: 12 mv
RV LEAD AMPLITUDE: 12 mv
RV LEAD AMPLITUDE: 12 mv
RV LEAD AMPLITUDE: 12 mv
RV LEAD AMPLITUDE: 12 mv
RV LEAD AMPLITUDE: 12 mv
RV LEAD AMPLITUDE: 12 mv
RV LEAD IMPEDENCE ICD: 400 Ohm
RV LEAD IMPEDENCE ICD: 400 Ohm
RV LEAD IMPEDENCE ICD: 400 Ohm
RV LEAD IMPEDENCE ICD: 400 Ohm
RV LEAD IMPEDENCE ICD: 400 Ohm
RV LEAD IMPEDENCE ICD: 400 Ohm
RV LEAD IMPEDENCE ICD: 400 Ohm
RV LEAD IMPEDENCE ICD: 400 Ohm
RV LEAD IMPEDENCE ICD: 400 Ohm
RV LEAD IMPEDENCE ICD: 400 Ohm
RV LEAD THRESHOLD: 1 V
RV LEAD THRESHOLD: 1 V
RV LEAD THRESHOLD: 1 V
RV LEAD THRESHOLD: 1 V
RV LEAD THRESHOLD: 1 V
RV LEAD THRESHOLD: 1 V
RV LEAD THRESHOLD: 1 V
RV LEAD THRESHOLD: 1 V
RV LEAD THRESHOLD: 1 V
RV LEAD THRESHOLD: 1 V
TOT-0006: 20140313000000
TOT-0006: 20140313000000
TOT-0006: 20140313000000
TOT-0006: 20140313000000
TOT-0006: 20140313000000
TOT-0006: 20140313000000
TOT-0006: 20140313000000
TOT-0006: 20140313000000
TOT-0006: 20140313000000
TOT-0006: 20140313000000
TOT-0007: 0
TOT-0007: 0
TOT-0007: 0
TOT-0007: 0
TOT-0007: 0
TOT-0007: 0
TOT-0007: 0
TOT-0007: 0
TOT-0007: 0
TOT-0007: 0
TOT-0008: 0
TOT-0008: 0
TOT-0008: 0
TOT-0008: 0
TOT-0008: 0
TOT-0008: 0
TOT-0008: 0
TOT-0008: 0
TOT-0008: 0
TOT-0008: 0
TOT-0009: 0
TOT-0009: 0
TOT-0009: 0
TOT-0009: 0
TOT-0009: 0
TOT-0009: 0
TOT-0009: 0
TOT-0009: 0
TOT-0009: 0
TOT-0009: 0
TOT-0010: 1
TOT-0010: 1
TOT-0010: 1
TOT-0010: 1
TOT-0010: 1
TOT-0010: 1
TOT-0010: 1
TOT-0010: 1
TOT-0010: 1
TOT-0010: 1
TZAT-0001SLOWVT: 1
TZAT-0001SLOWVT: 1
TZAT-0001SLOWVT: 1
TZAT-0001SLOWVT: 1
TZAT-0001SLOWVT: 1
TZAT-0001SLOWVT: 1
TZAT-0001SLOWVT: 1
TZAT-0001SLOWVT: 1
TZAT-0001SLOWVT: 1
TZAT-0001SLOWVT: 1
TZAT-0004SLOWVT: 8
TZAT-0004SLOWVT: 8
TZAT-0004SLOWVT: 8
TZAT-0004SLOWVT: 8
TZAT-0004SLOWVT: 8
TZAT-0004SLOWVT: 8
TZAT-0004SLOWVT: 8
TZAT-0004SLOWVT: 8
TZAT-0004SLOWVT: 8
TZAT-0004SLOWVT: 8
TZAT-0012SLOWVT: 200 ms
TZAT-0012SLOWVT: 200 ms
TZAT-0012SLOWVT: 200 ms
TZAT-0012SLOWVT: 200 ms
TZAT-0012SLOWVT: 200 ms
TZAT-0012SLOWVT: 200 ms
TZAT-0012SLOWVT: 200 ms
TZAT-0012SLOWVT: 200 ms
TZAT-0012SLOWVT: 200 ms
TZAT-0012SLOWVT: 200 ms
TZAT-0013SLOWVT: 3
TZAT-0013SLOWVT: 3
TZAT-0013SLOWVT: 3
TZAT-0013SLOWVT: 3
TZAT-0013SLOWVT: 3
TZAT-0013SLOWVT: 3
TZAT-0013SLOWVT: 3
TZAT-0013SLOWVT: 3
TZAT-0013SLOWVT: 3
TZAT-0013SLOWVT: 3
TZAT-0018SLOWVT: NEGATIVE
TZAT-0018SLOWVT: NEGATIVE
TZAT-0018SLOWVT: NEGATIVE
TZAT-0018SLOWVT: NEGATIVE
TZAT-0018SLOWVT: NEGATIVE
TZAT-0018SLOWVT: NEGATIVE
TZAT-0018SLOWVT: NEGATIVE
TZAT-0018SLOWVT: NEGATIVE
TZAT-0018SLOWVT: NEGATIVE
TZAT-0018SLOWVT: NEGATIVE
TZAT-0019SLOWVT: 7.5 V
TZAT-0019SLOWVT: 7.5 V
TZAT-0019SLOWVT: 7.5 V
TZAT-0019SLOWVT: 7.5 V
TZAT-0019SLOWVT: 7.5 V
TZAT-0019SLOWVT: 7.5 V
TZAT-0019SLOWVT: 7.5 V
TZAT-0019SLOWVT: 7.5 V
TZAT-0019SLOWVT: 7.5 V
TZAT-0019SLOWVT: 7.5 V
TZAT-0020SLOWVT: 1 ms
TZAT-0020SLOWVT: 1 ms
TZAT-0020SLOWVT: 1 ms
TZAT-0020SLOWVT: 1 ms
TZAT-0020SLOWVT: 1 ms
TZAT-0020SLOWVT: 1 ms
TZAT-0020SLOWVT: 1 ms
TZAT-0020SLOWVT: 1 ms
TZAT-0020SLOWVT: 1 ms
TZAT-0020SLOWVT: 1 ms
TZON-0003SLOWVT: 300 ms
TZON-0003SLOWVT: 300 ms
TZON-0003SLOWVT: 300 ms
TZON-0003SLOWVT: 300 ms
TZON-0003SLOWVT: 300 ms
TZON-0003SLOWVT: 300 ms
TZON-0003SLOWVT: 300 ms
TZON-0003SLOWVT: 300 ms
TZON-0003SLOWVT: 300 ms
TZON-0003SLOWVT: 300 ms
TZON-0004SLOWVT: 30
TZON-0004SLOWVT: 30
TZON-0004SLOWVT: 30
TZON-0004SLOWVT: 30
TZON-0004SLOWVT: 30
TZON-0004SLOWVT: 30
TZON-0004SLOWVT: 30
TZON-0004SLOWVT: 30
TZON-0004SLOWVT: 30
TZON-0004SLOWVT: 30
TZON-0005SLOWVT: 6
TZON-0005SLOWVT: 6
TZON-0005SLOWVT: 6
TZON-0005SLOWVT: 6
TZON-0005SLOWVT: 6
TZON-0005SLOWVT: 6
TZON-0005SLOWVT: 6
TZON-0005SLOWVT: 6
TZON-0005SLOWVT: 6
TZON-0005SLOWVT: 6
TZON-0010SLOWVT: 40 ms
TZON-0010SLOWVT: 40 ms
TZON-0010SLOWVT: 40 ms
TZON-0010SLOWVT: 40 ms
TZON-0010SLOWVT: 40 ms
TZON-0010SLOWVT: 40 ms
TZON-0010SLOWVT: 40 ms
TZON-0010SLOWVT: 40 ms
TZON-0010SLOWVT: 40 ms
TZON-0010SLOWVT: 40 ms
TZST-0001SLOWVT: 2
TZST-0001SLOWVT: 2
TZST-0001SLOWVT: 2
TZST-0001SLOWVT: 2
TZST-0001SLOWVT: 2
TZST-0001SLOWVT: 2
TZST-0001SLOWVT: 2
TZST-0001SLOWVT: 2
TZST-0001SLOWVT: 2
TZST-0001SLOWVT: 2
TZST-0001SLOWVT: 3
TZST-0001SLOWVT: 3
TZST-0001SLOWVT: 3
TZST-0001SLOWVT: 3
TZST-0001SLOWVT: 3
TZST-0001SLOWVT: 3
TZST-0001SLOWVT: 3
TZST-0001SLOWVT: 3
TZST-0001SLOWVT: 3
TZST-0001SLOWVT: 3
TZST-0001SLOWVT: 4
TZST-0001SLOWVT: 4
TZST-0001SLOWVT: 4
TZST-0001SLOWVT: 4
TZST-0001SLOWVT: 4
TZST-0001SLOWVT: 4
TZST-0001SLOWVT: 4
TZST-0001SLOWVT: 4
TZST-0001SLOWVT: 4
TZST-0001SLOWVT: 4
TZST-0001SLOWVT: 5
TZST-0001SLOWVT: 5
TZST-0001SLOWVT: 5
TZST-0001SLOWVT: 5
TZST-0001SLOWVT: 5
TZST-0001SLOWVT: 5
TZST-0001SLOWVT: 5
TZST-0001SLOWVT: 5
TZST-0001SLOWVT: 5
TZST-0001SLOWVT: 5
TZST-0003SLOWVT: 845 V
TZST-0003SLOWVT: 845 V
TZST-0003SLOWVT: 845 V
TZST-0003SLOWVT: 845 V
TZST-0003SLOWVT: 845 V
TZST-0003SLOWVT: 845 V
TZST-0003SLOWVT: 845 V
TZST-0003SLOWVT: 845 V
TZST-0003SLOWVT: 845 V
TZST-0003SLOWVT: 845 V
TZST-0003SLOWVT: 890 V
TZST-0003SLOWVT: 890 V
TZST-0003SLOWVT: 890 V
TZST-0003SLOWVT: 890 V
TZST-0003SLOWVT: 890 V
TZST-0003SLOWVT: 890 V
TZST-0003SLOWVT: 890 V
TZST-0003SLOWVT: 890 V
TZST-0003SLOWVT: 890 V
TZST-0003SLOWVT: 890 V
TZST-0003SLOWVT: 890 V
TZST-0003SLOWVT: 890 V
TZST-0003SLOWVT: 890 V
TZST-0003SLOWVT: 890 V
TZST-0003SLOWVT: 890 V
TZST-0003SLOWVT: 890 V
TZST-0003SLOWVT: 890 V
TZST-0003SLOWVT: 890 V
TZST-0003SLOWVT: 890 V
TZST-0003SLOWVT: 890 V
VENTRICULAR PACING ICD: 99.97 pct
VENTRICULAR PACING ICD: 99.97 pct
VENTRICULAR PACING ICD: 99.97 pct
VENTRICULAR PACING ICD: 99.97 pct
VENTRICULAR PACING ICD: 99.97 pct
VENTRICULAR PACING ICD: 99.97 pct
VENTRICULAR PACING ICD: 99.97 pct
VENTRICULAR PACING ICD: 99.97 pct
VENTRICULAR PACING ICD: 99.97 pct
VENTRICULAR PACING ICD: 99.97 pct
VF: 0
VF: 0
VF: 0
VF: 0
VF: 0
VF: 0
VF: 0
VF: 0
VF: 0
VF: 0

## 2012-07-30 NOTE — Progress Notes (Signed)
Wound check-ICD 

## 2012-08-02 ENCOUNTER — Encounter: Payer: Self-pay | Admitting: Cardiology

## 2012-08-02 DIAGNOSIS — Z4502 Encounter for adjustment and management of automatic implantable cardiac defibrillator: Secondary | ICD-10-CM | POA: Insufficient documentation

## 2012-08-02 DIAGNOSIS — R9430 Abnormal result of cardiovascular function study, unspecified: Secondary | ICD-10-CM | POA: Insufficient documentation

## 2012-08-02 DIAGNOSIS — IMO0002 Reserved for concepts with insufficient information to code with codable children: Secondary | ICD-10-CM | POA: Insufficient documentation

## 2012-08-05 ENCOUNTER — Other Ambulatory Visit: Payer: Self-pay | Admitting: Family Medicine

## 2012-08-05 NOTE — Telephone Encounter (Signed)
Left message on voice mail  to call back

## 2012-08-05 NOTE — Telephone Encounter (Signed)
Would continue for now.  He was just admitted (cards sent me the notes- tell him I hope he is feeling better).  I would have him get his f/u with cards and then have a DM2 and lipid check here later in the spring. Thanks.  Rx sent.

## 2012-08-05 NOTE — Telephone Encounter (Signed)
Received refill request electronically. Please advise when patient needs lab work?

## 2012-08-06 ENCOUNTER — Ambulatory Visit (INDEPENDENT_AMBULATORY_CARE_PROVIDER_SITE_OTHER): Payer: Medicare Other | Admitting: Cardiology

## 2012-08-06 ENCOUNTER — Encounter: Payer: Self-pay | Admitting: Cardiology

## 2012-08-06 VITALS — BP 184/102 | HR 80 | Ht 75.0 in | Wt 178.1 lb

## 2012-08-06 DIAGNOSIS — I2589 Other forms of chronic ischemic heart disease: Secondary | ICD-10-CM

## 2012-08-06 DIAGNOSIS — I10 Essential (primary) hypertension: Secondary | ICD-10-CM

## 2012-08-06 DIAGNOSIS — I714 Abdominal aortic aneurysm, without rupture, unspecified: Secondary | ICD-10-CM

## 2012-08-06 DIAGNOSIS — I442 Atrioventricular block, complete: Secondary | ICD-10-CM

## 2012-08-06 DIAGNOSIS — I2510 Atherosclerotic heart disease of native coronary artery without angina pectoris: Secondary | ICD-10-CM

## 2012-08-06 DIAGNOSIS — Z4502 Encounter for adjustment and management of automatic implantable cardiac defibrillator: Secondary | ICD-10-CM

## 2012-08-06 DIAGNOSIS — I255 Ischemic cardiomyopathy: Secondary | ICD-10-CM

## 2012-08-06 MED ORDER — CARVEDILOL 12.5 MG PO TABS: 12.5000 mg | ORAL_TABLET | Freq: Two times a day (BID) | ORAL | Status: DC

## 2012-08-06 NOTE — Assessment & Plan Note (Signed)
The patient pushed hard for me to allow him to be more active only 3 weeks after his ICD is placed. I researched this with our electrophysiology team and together we all talked with him further. There are specific rules and he's been given.  As part of today's evaluation I spent greater than 25 minutes with his total care. More than half of 25 minutes was spent with direct contact with the patient in string many issues.

## 2012-08-06 NOTE — Assessment & Plan Note (Signed)
The patient insists that his blood pressure is normal at home. It is elevated here in the office. I will still need to be pushing his meds for his left ventricular dysfunction.

## 2012-08-06 NOTE — Assessment & Plan Note (Signed)
We know that he is moderate coronary disease by cath in June, 2013. Intervention was not needed.

## 2012-08-06 NOTE — Patient Instructions (Addendum)
Your physician recommends that you schedule a follow-up appointment in: 5 weeks  Your physician recommends that you continue on your current medications as directed. Please refer to the Current Medication list given to you today.  Check your pulse off and on starting in 3 weeks and call us after a couple weeks at [phone] with the readings.  Dr Myrtis Ser will decide at that time if he needs to change your medication.

## 2012-08-06 NOTE — Telephone Encounter (Signed)
Discussed instructions with pt.  Verbalized understanding.  Appts scheduled in May 2014 for labs and f/u visit (per pt's request).

## 2012-08-06 NOTE — Progress Notes (Signed)
HPI  Patient is seen to followup his overall cardiac care. I had seen him in 2013. As part of screening for abdominal aortic aneurysm repair, it was seen that he had severe LV dysfunction. Catheterization was done. It showed that he had significant 2 vessel disease but PCI was not needed. He was cleared for procedure and he had stenting of his abdominal aortic aneurysm. I had not seen him back for followup. His ejection fraction was in the 30% range. Over time he is always felt that he was doing well and never had any significant cardiac problems.  Recently he felt poorly and he came in and was seen in our office. He was noted to be in complete heart block. It was felt appropriate to proceed with pacing. This was done and he received CRT-D. He has an underlying left bundle branch block. He is now back for followup. He wants to be very active. He wants to go about more vigorous activities and he should only 3 weeks after ICD is placed.  Allergies  Allergen Reactions  . Codeine Hives    Current Outpatient Prescriptions  Medication Sig Dispense Refill  . aspirin 81 MG tablet Take 1 tablet (81 mg total) by mouth daily.      . carvedilol (COREG) 12.5 MG tablet Take 1 tablet (12.5 mg total) by mouth 2 (two) times daily with a meal.  60 tablet  6  . glipiZIDE (GLUCOTROL) 5 MG tablet Take 5 mg by mouth daily.      Marland Kitchen lisinopril (PRINIVIL,ZESTRIL) 20 MG tablet Take 1 tablet (20 mg total) by mouth 2 (two) times daily.  180 tablet  3  . metFORMIN (GLUCOPHAGE) 500 MG tablet Take 2 tablets (1,000 mg total) by mouth 2 (two) times daily with a meal. HOLD for 2 days, then resume usual dose on 07/19/2012  360 tablet  3  . Multiple Vitamin (MULTIVITAMIN) tablet Take 1 tablet by mouth daily.        . sildenafil (VIAGRA) 50 MG tablet Take 50 mg by mouth daily as needed. For sexual activity      . simvastatin (ZOCOR) 40 MG tablet Take 40 mg by mouth at bedtime.      . simvastatin (ZOCOR) 40 MG tablet take 1 tablet  by mouth at bedtime  90 tablet  3   No current facility-administered medications for this visit.    History   Social History  . Marital Status: Married    Spouse Name: N/A    Number of Children: N/A  . Years of Education: N/A   Occupational History  . Not on file.   Social History Main Topics  . Smoking status: Former Smoker    Types: Cigarettes    Quit date: 05/06/2004  . Smokeless tobacco: Never Used     Comment: quit about 7 years ago  . Alcohol Use: 1.2 oz/week    2 Cans of beer per week     Comment: beer or wine  occassionally  . Drug Use: No  . Sexually Active: Not on file   Other Topics Concern  . 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 Hypertension Mother   . Diabetes Sister   . Heart disease Sister   . Hypertension Sister     Past Medical History  Diagnosis Date  . Diabetes mellitus     type II  .  PAD (peripheral artery disease)   . AAA (abdominal aortic aneurysm) 2011    Per vascular surgery  . HLD (hyperlipidemia)   . Hypertension     white coat HTN-- often elevated in office and controlled on outside checks.  . Low testosterone     Hx of  . Cardiomyopathy     Nuclear, October 09, 2011, EF 30%, multiple focal wall motion abnormalities  . Ejection fraction < 50%     EF 30%, nuclear, October 09, 2011  . LBBB (left bundle branch block)     LBBB on EKG October 11, 2011,  no prior EKG has been done  . CAD (coronary artery disease)     Presumed CAD with nuclear scan October 09, 2011,  large anteroseptal MI and inferior MI. Catheterization scheduled October 15, 2011  . Anxiety     no med. in use for anxiety but pt. speaks openly of his stress & anxious feelings regarding impending surgery    . ICD (implantable cardiac defibrillator) battery depletion     CRT-Dplaced March, 20144 complete heart block and k dysfunction  . Ejection fraction < 50%     EF previously 30%  //   EF 30-35%, echo,  July 14, 2012, severe diffuse hypokinesis, PA pressure 43 mm mercury    Past Surgical History  Procedure Laterality Date  . Tonsillectomy      as a child   . Colonoscopy    . Cardiac catheterization    . Abdominal aortic aneurysm repair      EVAR     Patient Active Problem List  Diagnosis  . Type 2 diabetes mellitus with vascular disease  . ESSENTIAL HYPERTENSION, BENIGN  . ORGANIC IMPOTENCE  . AAA (abdominal aortic aneurysm)  . PVD (peripheral vascular disease)  . CAD (coronary artery disease)  . Ischemic cardiomyopathy  . LBBB (left bundle branch block)  . Medicare welcome exam  . Shoulder pain  . SK (seborrheic keratosis)  . Complete heart block  . ICD (implantable cardiac defibrillator) battery depletion  . Ejection fraction < 50%    ROS   Patient denies fever, chills, headache, sweats, rash, change in vision, change in hearing, chest pain, cough, nausea vomiting, urinary symptoms. All other systems are reviewed and are negative.  PHYSICAL EXAM  Patient looks good. There is no jugulovenous distention. Lungs are clear. Respiratory effort is nonlabored. Cardiac exam reveals S1 and S2. There no clicks or significant murmurs. His ICD site is nicely healing. The abdomen is soft. There is no peripheral edema. There are no musculoskeletal deformities. There are no skin rashes.  Filed Vitals:   08/06/12 1053  BP: 184/102  Pulse: 80  Height: 6\' 3"  (1.905 m)  Weight: 178 lb 1.9 oz (80.795 kg)  SpO2: 99%     ASSESSMENT & PLAN

## 2012-08-06 NOTE — Assessment & Plan Note (Signed)
Fortunately his abdominal aortic aneurysm has been repaired. No further workup.

## 2012-08-06 NOTE — Assessment & Plan Note (Signed)
His complete heart block has been fixed with the pacing function of his ICD pacer.

## 2012-08-06 NOTE — Assessment & Plan Note (Signed)
He has an ischemic cardiomyopathy. His followup recent echo showed an EF in the 30% range. Today his carvedilol dose will be increased. I will see him back and we will try to titrate his medicines optimally.

## 2012-09-14 ENCOUNTER — Other Ambulatory Visit (INDEPENDENT_AMBULATORY_CARE_PROVIDER_SITE_OTHER): Payer: Medicare Other

## 2012-09-14 DIAGNOSIS — E119 Type 2 diabetes mellitus without complications: Secondary | ICD-10-CM | POA: Diagnosis not present

## 2012-09-14 LAB — LIPID PANEL
Cholesterol: 129 mg/dL (ref 0–200)
HDL: 37.4 mg/dL — ABNORMAL LOW (ref >39.00)
LDL Cholesterol: 56 mg/dL (ref 0–99)
Total CHOL/HDL Ratio: 3
Triglycerides: 179 mg/dL — ABNORMAL HIGH (ref 0.0–149.0)
VLDL: 35.8 mg/dL (ref 0.0–40.0)

## 2012-09-14 LAB — HEMOGLOBIN A1C: Hgb A1c MFr Bld: 6.7 % — ABNORMAL HIGH (ref 4.6–6.5)

## 2012-09-15 ENCOUNTER — Encounter: Payer: Self-pay | Admitting: Family Medicine

## 2012-09-15 ENCOUNTER — Ambulatory Visit (INDEPENDENT_AMBULATORY_CARE_PROVIDER_SITE_OTHER): Payer: Medicare Other | Admitting: Family Medicine

## 2012-09-15 VITALS — BP 162/80 | HR 71 | Temp 98.0°F | Wt 184.5 lb

## 2012-09-15 DIAGNOSIS — I10 Essential (primary) hypertension: Secondary | ICD-10-CM | POA: Diagnosis not present

## 2012-09-15 DIAGNOSIS — I798 Other disorders of arteries, arterioles and capillaries in diseases classified elsewhere: Secondary | ICD-10-CM | POA: Diagnosis not present

## 2012-09-15 DIAGNOSIS — E785 Hyperlipidemia, unspecified: Secondary | ICD-10-CM | POA: Diagnosis not present

## 2012-09-15 DIAGNOSIS — E1159 Type 2 diabetes mellitus with other circulatory complications: Secondary | ICD-10-CM | POA: Diagnosis not present

## 2012-09-15 NOTE — Patient Instructions (Addendum)
Check with your insurance to see if they will cover the shingles shot. I would get a flu shot each fall.    Let me know if you need a referral about the hernia.   Keep working on your diet and we'll recheck labs in the fall (schedule a physical in about 6 months).

## 2012-09-15 NOTE — Progress Notes (Signed)
He's back at the gym and doing well with his exercise.  He's doing about 70 minutes of cardio.  He's limiting weights due to his abd hernia.  His energy level is good and isn't SOB.  No shocks and he feels well. He has had f/u with cards.    Diabetes:  Using medications without difficulties:yes Hypoglycemic episodes:no Hyperglycemic episodes:no Feet problems:at baseline Blood Sugars averaging: 100-120, occ lower eye exam within last year: has appointment for 7/14.  A1c 6.7, stable  Hypertension:    Using medication without problems or lightheadedness: yes Chest pain with exertion:no Edema:no Short of breath:no Average home BPs: 110s/60s.   Elevated Cholesterol: Using medications without problems: yes Muscle aches: no Diet compliance:yes Exercise: yes  PMH and SH reviewed.   Vital signs, Meds and allergies reviewed.  ROS: See HPI.  Otherwise nontributory.   GEN: nad, alert and oriented HEENT: mucous membranes moist NECK: supple w/o LA CV: rrr PULM: ctab, no inc wob ABD: soft, +bs, umbilical hernia noted.  EXT: no edema SKIN: no acute rash  Diabetic foot exam: Normal inspection No skin breakdown Calluses noted B Dec DP pulses B Normal sensation to light tough and monofilament Nails thickened

## 2012-09-16 DIAGNOSIS — E785 Hyperlipidemia, unspecified: Secondary | ICD-10-CM | POA: Insufficient documentation

## 2012-09-16 NOTE — Assessment & Plan Note (Signed)
A1c okay, continue meds, diet, exercise.  He agrees.  Recheck in about 6 months.  Labs discussed.

## 2012-09-16 NOTE — Assessment & Plan Note (Signed)
Lipid okay except for slightly high TG, continue meds, diet, exercise.  He agrees.  Labs discussed.

## 2012-09-16 NOTE — Assessment & Plan Note (Signed)
Home BP okay, always up in the clinic.  Will continue meds, diet, exercise.  He agrees.  Recheck in about 6 months.  Labs discussed.

## 2012-10-02 ENCOUNTER — Encounter: Payer: Self-pay | Admitting: Cardiology

## 2012-10-02 ENCOUNTER — Telehealth: Payer: Self-pay

## 2012-10-02 ENCOUNTER — Ambulatory Visit (INDEPENDENT_AMBULATORY_CARE_PROVIDER_SITE_OTHER): Payer: Medicare Other | Admitting: Cardiology

## 2012-10-02 VITALS — BP 180/88 | HR 84 | Ht 75.0 in | Wt 185.0 lb

## 2012-10-02 DIAGNOSIS — I2589 Other forms of chronic ischemic heart disease: Secondary | ICD-10-CM | POA: Diagnosis not present

## 2012-10-02 DIAGNOSIS — I2510 Atherosclerotic heart disease of native coronary artery without angina pectoris: Secondary | ICD-10-CM

## 2012-10-02 DIAGNOSIS — I10 Essential (primary) hypertension: Secondary | ICD-10-CM | POA: Diagnosis not present

## 2012-10-02 DIAGNOSIS — I255 Ischemic cardiomyopathy: Secondary | ICD-10-CM

## 2012-10-02 DIAGNOSIS — I442 Atrioventricular block, complete: Secondary | ICD-10-CM | POA: Diagnosis not present

## 2012-10-02 LAB — BASIC METABOLIC PANEL
BUN: 28 mg/dL — ABNORMAL HIGH (ref 6–23)
CO2: 24 mEq/L (ref 19–32)
Calcium: 9.6 mg/dL (ref 8.4–10.5)
Chloride: 106 mEq/L (ref 96–112)
Creatinine, Ser: 1.1 mg/dL (ref 0.4–1.5)
GFR: 71.19 mL/min (ref >60.00)
Glucose, Bld: 155 mg/dL — ABNORMAL HIGH (ref 70–99)
Potassium: 4.5 mEq/L (ref 3.5–5.1)
Sodium: 137 mEq/L (ref 135–145)

## 2012-10-02 MED ORDER — SPIRONOLACTONE 25 MG PO TABS: 12.5000 mg | ORAL_TABLET | Freq: Every day | ORAL | Status: DC

## 2012-10-02 NOTE — Assessment & Plan Note (Signed)
The patient is pacing with his ICD pacemaker. He is doing well. No change in therapy.

## 2012-10-02 NOTE — Assessment & Plan Note (Signed)
Patient has whitecoat hypertension. He reports his blood pressure at home and repeatedly it is normal. No change in therapy.

## 2012-10-02 NOTE — Assessment & Plan Note (Signed)
The patient is on an ACE inhibitor and a beta blocker. He record his pulse at home. It is in the 60 range. Therefore I will not change his beta blocker. He is on an ACE inhibitor. He has whitecoat hypertension. His blood pressure recorded repeatedly at home is in the range of 115 120 systolic. I have talked to him at length today about starting spironolactone. He is in agreement. His potassium has not been low in the past. We will check potassium today before he starts the med. We will then check his potassium again in one week in 3 weeks after his spironolactone was started at 12.5 mg daily. As part of today's evaluation I had an extensive discussion with him. I spent greater than 25 minutes. More than half of this time has been with discussing all of the issues including starting spironolactone.

## 2012-10-02 NOTE — Assessment & Plan Note (Signed)
Coronary disease is stable. Last catheterization June, 2013. No further workup.

## 2012-10-02 NOTE — Telephone Encounter (Signed)
**Note De-Identified Darcus Edds Obfuscation** Pts wife, [name], is advised that the pts lab results are back and that per Dr Myrtis Ser he needs to pick up his RX of Spironolactone and begin taking, she verbalized understanding and states that she will notify the pt.

## 2012-10-02 NOTE — Patient Instructions (Addendum)
**Note De-Identified Luke [name] Obfuscation** Your physician has recommended you make the following change in your medication: Start taking Spironolactone 12.5 mg (1/2 of a 25 mg tablet) daily. PLEASE DO NOT PICK UP PRESCRIPTION UNTIL YOUR LAB RESULTS ARE BACK.  Your physician recommends that you return for lab work in: today, on June 6 and on June 20.  Your physician wants you to follow-up in: 6 months. You will receive a reminder letter in the mail two months in advance. If you don't receive a letter, please call our office to schedule the follow-up appointment.

## 2012-10-02 NOTE — Progress Notes (Signed)
HPI  Patient is seen today to followup cardiomyopathy. He is doing very well. He is very active. He has his ICD in place and he is doing very well. He has been on beta blocker and ACE inhibitor. We have not yet tried spironolactone. This needs to be done carefully.  Allergies  Allergen Reactions  . Codeine Hives    Current Outpatient Prescriptions  Medication Sig Dispense Refill  . aspirin 325 MG tablet Take 325 mg by mouth daily.      . carvedilol (COREG) 12.5 MG tablet Take 1 tablet (12.5 mg total) by mouth 2 (two) times daily with a meal.  60 tablet  6  . glipiZIDE (GLUCOTROL) 5 MG tablet Take 5 mg by mouth 2 (two) times daily before a meal.       . lisinopril (PRINIVIL,ZESTRIL) 20 MG tablet Take 1 tablet (20 mg total) by mouth 2 (two) times daily.  180 tablet  3  . metFORMIN (GLUCOPHAGE) 500 MG tablet Take 2 tablets (1,000 mg total) by mouth 2 (two) times daily with a meal. HOLD for 2 days, then resume usual dose on 07/19/2012  360 tablet  3  . Multiple Vitamin (MULTIVITAMIN) tablet Take 1 tablet by mouth daily.        . sildenafil (VIAGRA) 50 MG tablet Take 50 mg by mouth daily as needed. For sexual activity      . simvastatin (ZOCOR) 40 MG tablet Take 40 mg by mouth at bedtime.       No current facility-administered medications for this visit.    History   Social History  . Marital Status: Married    Spouse Name: N/A    Number of Children: N/A  . Years of Education: N/A   Occupational History  . Not on file.   Social History Main Topics  . Smoking status: Former Smoker    Types: Cigarettes    Quit date: 05/06/2004  . Smokeless tobacco: Never Used     Comment: quit about 7 years ago  . Alcohol Use: 1.2 oz/week    2 Cans of beer per week     Comment: beer or wine  occassionally  . Drug Use: No  . Sexually Active: Not on file   Other Topics Concern  . 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 Hypertension Mother   . Diabetes Sister   . Heart disease Sister   . Hypertension Sister     Past Medical History  Diagnosis Date  . Diabetes mellitus     type II  . PAD (peripheral artery disease)   . AAA (abdominal aortic aneurysm) 2011    Per vascular surgery  . HLD (hyperlipidemia)   . Hypertension     white coat HTN-- often elevated in office and controlled on outside checks.  . Low testosterone     Hx of  . Cardiomyopathy     Nuclear, October 09, 2011, EF 30%, multiple focal wall motion abnormalities  . Ejection fraction < 50%     EF 30%, nuclear, October 09, 2011  . LBBB (left bundle branch block)     LBBB on EKG October 11, 2011,  no prior EKG has been done  . CAD (coronary artery disease)     Presumed CAD with nuclear scan October 09, 2011,  large anteroseptal MI and inferior MI. Catheterization scheduled October 15, 2011  .  Anxiety     no med. in use for anxiety but pt. speaks openly of his stress & anxious feelings regarding impending surgery    . ICD (implantable cardiac defibrillator) battery depletion     CRT-Dplaced March, 20144 complete heart block and k dysfunction  . Ejection fraction < 50%     EF previously 30%  //   EF 30-35%, echo, July 14, 2012, severe diffuse hypokinesis, PA pressure 43 mm mercury    Past Surgical History  Procedure Laterality Date  . Tonsillectomy      as a child   . Colonoscopy    . Cardiac catheterization    . Abdominal aortic aneurysm repair      EVAR     Patient Active Problem List   Diagnosis Date Noted  . HLD (hyperlipidemia) 09/16/2012  . ICD (implantable cardiac defibrillator) battery depletion   . Ejection fraction < 50%   . Complete heart block 07/14/2012  . SK (seborrheic keratosis) 02/12/2012  . Medicare welcome exam 11/08/2011  . Shoulder pain 11/08/2011  . CAD (coronary artery disease)   . Ischemic cardiomyopathy   . LBBB (left bundle branch block)   . AAA (abdominal aortic aneurysm) 10/08/2010  . PVD (peripheral  vascular disease) 10/08/2010  . ORGANIC IMPOTENCE 07/05/2010  . ESSENTIAL HYPERTENSION, BENIGN 02/27/2010  . Type 2 diabetes mellitus with vascular disease 11/16/2009    ROS   Patient denies fever, chills, headache, sweats, rash, change in vision, change in hearing, chest pain, cough, nausea vomiting, urinary symptoms. All other systems are reviewed and are negative.  PHYSICAL EXAM  Patient is oriented to person time and place. Affect is normal. Lungs are clear. Respiratory effort is nonlabored. Cardiac exam reveals S1 and S2. There no clicks or significant murmurs. The abdomen is soft. Is no peripheral edema. His ICD site is nicely healed. There no musculoskeletal deformities. There are no skin rashes.  Filed Vitals:   10/02/12 1053  BP: 180/88  Pulse: 84  Height: 6\' 3"  (1.905 m)  Weight: 185 lb (83.915 kg)    ASSESSMENT & PLAN

## 2012-10-09 ENCOUNTER — Other Ambulatory Visit (INDEPENDENT_AMBULATORY_CARE_PROVIDER_SITE_OTHER): Payer: Medicare Other

## 2012-10-09 DIAGNOSIS — I255 Ischemic cardiomyopathy: Secondary | ICD-10-CM

## 2012-10-09 DIAGNOSIS — I10 Essential (primary) hypertension: Secondary | ICD-10-CM

## 2012-10-09 DIAGNOSIS — I2589 Other forms of chronic ischemic heart disease: Secondary | ICD-10-CM | POA: Diagnosis not present

## 2012-10-09 LAB — BASIC METABOLIC PANEL
BUN: 28 mg/dL — ABNORMAL HIGH (ref 6–23)
CO2: 22 mEq/L (ref 19–32)
Calcium: 9.7 mg/dL (ref 8.4–10.5)
Chloride: 106 mEq/L (ref 96–112)
Creatinine, Ser: 1.1 mg/dL (ref 0.4–1.5)
GFR: 68.31 mL/min (ref >60.00)
Glucose, Bld: 180 mg/dL — ABNORMAL HIGH (ref 70–99)
Potassium: 5.4 mEq/L — ABNORMAL HIGH (ref 3.5–5.1)
Sodium: 138 mEq/L (ref 135–145)

## 2012-10-12 ENCOUNTER — Encounter: Payer: Self-pay | Admitting: Cardiology

## 2012-10-12 NOTE — Progress Notes (Signed)
   When I saw the patient recently we started him on a small dose of spironolactone. He had had some difficulty with elevated potassium in the past unrelated to this drug. We have followed him carefully and his potassium is now going up. He has been notified to stop the spironolactone.  Luke Bonito, MD

## 2012-10-22 ENCOUNTER — Encounter: Payer: Self-pay | Admitting: Internal Medicine

## 2012-10-22 ENCOUNTER — Encounter: Payer: Self-pay | Admitting: Cardiology

## 2012-10-22 ENCOUNTER — Ambulatory Visit (INDEPENDENT_AMBULATORY_CARE_PROVIDER_SITE_OTHER): Payer: Medicare Other | Admitting: Cardiology

## 2012-10-22 VITALS — BP 160/76 | HR 80 | Ht 75.0 in | Wt 186.0 lb

## 2012-10-22 DIAGNOSIS — Z95810 Presence of automatic (implantable) cardiac defibrillator: Secondary | ICD-10-CM | POA: Diagnosis not present

## 2012-10-22 DIAGNOSIS — I442 Atrioventricular block, complete: Secondary | ICD-10-CM | POA: Diagnosis not present

## 2012-10-22 DIAGNOSIS — I2589 Other forms of chronic ischemic heart disease: Secondary | ICD-10-CM | POA: Diagnosis not present

## 2012-10-22 DIAGNOSIS — I10 Essential (primary) hypertension: Secondary | ICD-10-CM

## 2012-10-22 DIAGNOSIS — I447 Left bundle-branch block, unspecified: Secondary | ICD-10-CM

## 2012-10-22 DIAGNOSIS — I5022 Chronic systolic (congestive) heart failure: Secondary | ICD-10-CM | POA: Diagnosis not present

## 2012-10-22 DIAGNOSIS — I255 Ischemic cardiomyopathy: Secondary | ICD-10-CM

## 2012-10-22 LAB — ICD DEVICE OBSERVATION
AL AMPLITUDE: 4.4 mv
AL IMPEDENCE ICD: 460 Ohm
AL THRESHOLD: 0.5 V
ATRIAL PACING ICD: 16 pct
BAMS-0001: 150 {beats}/min
BAMS-0003: 70 {beats}/min
DEV-0020ICD: NEGATIVE
DEVICE MODEL ICD: 7097007
FVT: 0
HV IMPEDENCE: 77 Ohm
LV LEAD IMPEDENCE ICD: 990 Ohm
LV LEAD THRESHOLD: 1.25 V
MODE SWITCH EPISODES: 0
PACEART VT: 0
RV LEAD AMPLITUDE: 12 mv
RV LEAD IMPEDENCE ICD: 490 Ohm
RV LEAD THRESHOLD: 0.75 V
TZAT-0001SLOWVT: 1
TZAT-0004SLOWVT: 8
TZAT-0012SLOWVT: 200 ms
TZAT-0013SLOWVT: 3
TZAT-0018SLOWVT: NEGATIVE
TZAT-0019SLOWVT: 7.5 V
TZAT-0020SLOWVT: 1 ms
TZON-0003SLOWVT: 300 ms
TZON-0004SLOWVT: 30
TZON-0005SLOWVT: 6
TZON-0010SLOWVT: 40 ms
TZST-0001SLOWVT: 2
TZST-0001SLOWVT: 3
TZST-0001SLOWVT: 4
TZST-0001SLOWVT: 5
TZST-0003SLOWVT: 845 V
TZST-0003SLOWVT: 890 V
TZST-0003SLOWVT: 890 V
VENTRICULAR PACING ICD: 99 pct
VF: 0

## 2012-10-22 NOTE — Patient Instructions (Addendum)
Your physician recommends that you schedule a follow-up appointment in: 3 MONTHS FOR DEVICE CHECK  Your physician recommends that you continue on your current medications as directed. Please refer to the Current Medication list given to you today.

## 2012-10-22 NOTE — Progress Notes (Signed)
ELECTROPHYSIOLOGY OFFICE NOTE  Patient ID: Luke [name] MRN: [id number], DOB/AGE: 11/14/1945   Date of Visit: 10/22/2012  Primary Physician: Crawford Givens, MD Primary Cardiologist / EP: Myrtis Ser, MD / Graciela Husbands, MD Reason for Visit: EP/device follow-up  History of Present Illness Luke [name] is a pleasant 67 year old man with complete heart block in setting of an ischemic CM, LBBB and chronic systolic HF now s/p CRT-D implant who presents today for routine electrophysiology followup. Since last being seen in our clinic, he reports he is doing well. Today, he denies chest pain or shortness of breath. He denies palpitations, dizziness, near syncope or syncope. He denies LE swelling, orthopnea, PND or recent weight gain. Luke [name] states that he is compliant and tolerating medications without difficulty. He exercises daily for 40 minutes at a time using the treadmill, bike and elliptical machine without difficulty or limitation. He also checks his BP at home daily (3 times in each arm then averages) and states his BP is always 120/70. He notes it is always elevated at MD offices stating jokingly, "You people freak me out." He has taken his medications today.  Past Medical History Past Medical History  Diagnosis Date  . Diabetes mellitus     type II  . PAD (peripheral artery disease)   . AAA (abdominal aortic aneurysm) 2011    Per vascular surgery  . HLD (hyperlipidemia)   . Hypertension     white coat HTN-- often elevated in office and controlled on outside checks.  . Low testosterone     Hx of  . Cardiomyopathy     Nuclear, October 09, 2011, EF 30%, multiple focal wall motion abnormalities  . Ejection fraction < 50%     EF 30%, nuclear, October 09, 2011  . LBBB (left bundle branch block)     LBBB on EKG October 11, 2011,  no prior EKG has been done  . CAD (coronary artery disease)     Presumed CAD with nuclear scan October 09, 2011,  large anteroseptal MI and inferior MI. Catheterization scheduled  October 15, 2011  . Anxiety     no med. in use for anxiety but pt. speaks openly of his stress & anxious feelings regarding impending surgery    . ICD (implantable cardiac defibrillator) battery depletion     CRT-Dplaced March, 20144 complete heart block and k dysfunction  . Ejection fraction < 50%     EF previously 30%  //   EF 30-35%, echo, July 14, 2012, severe diffuse hypokinesis, PA pressure 43 mm mercury    Past Surgical History Past Surgical History  Procedure Laterality Date  . Tonsillectomy      as a child   . Colonoscopy    . Cardiac catheterization    . Abdominal aortic aneurysm repair      EVAR     Allergies/Intolerances Allergies  Allergen Reactions  . Codeine Hives   Current Home Medications Current Outpatient Prescriptions  Medication Sig Dispense Refill  . aspirin 325 MG tablet Take 325 mg by mouth daily.      . carvedilol (COREG) 12.5 MG tablet Take 1 tablet (12.5 mg total) by mouth 2 (two) times daily with a meal.  60 tablet  6  . glipiZIDE (GLUCOTROL) 5 MG tablet Take 5 mg by mouth 2 (two) times daily before a meal.       . lisinopril (PRINIVIL,ZESTRIL) 20 MG tablet Take 1 tablet (20 mg total) by mouth 2 (two) times daily.  180 tablet  3  . metFORMIN (GLUCOPHAGE) 500 MG tablet Take 2 tablets (1,000 mg total) by mouth 2 (two) times daily with a meal. HOLD for 2 days, then resume usual dose on 07/19/2012  360 tablet  3  . Multiple Vitamin (MULTIVITAMIN) tablet Take 1 tablet by mouth daily.        . sildenafil (VIAGRA) 50 MG tablet Take 50 mg by mouth daily as needed. For sexual activity      . simvastatin (ZOCOR) 40 MG tablet Take 40 mg by mouth at bedtime.       No current facility-administered medications for this visit.   Social History History   Social History  . Marital Status: Married    Spouse Name: N/A    Number of Children: N/A  . Years of Education: N/A   Occupational History  . Not on file.   Social History Main Topics  . Smoking status:  Former Smoker    Types: Cigarettes    Quit date: 05/06/2004  . Smokeless tobacco: Never Used     Comment: quit about 7 years ago  . Alcohol Use: 1.2 oz/week    2 Cans of beer per week     Comment: beer or wine  occassionally  . Drug Use: No  . Sexually Active: Not on file   Other Topics Concern  . 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  Physical Exam Blood pressure 160/76, pulse 80, height 6\' 3"  (1.905 m), weight 186 lb (84.369 kg).  General: Well developed, well appearing 67 year old male in no acute distress. HEENT: Normocephalic, atraumatic. EOMs intact. Sclera nonicteric. Oropharynx clear.  Neck: Supple without bruits. No JVD. Lungs: Respirations regular and unlabored, CTA bilaterally. No wheezes, rales or rhonchi. Heart: RRR. S1, S2 present. No murmurs, rub, S3 or S4. Abdomen: Soft, non-distended. Extremities: No clubbing, cyanosis or edema. PT/Radials 2+ and equal bilaterally. Psych: Normal affect. Neuro: Alert and oriented X 3. Moves all extremities spontaneously.   Diagnostics Device interrogation today - Thresholds and sensing consistent with previous device measurements. Lead impedance trends stable over time. No mode switch episodes recorded. No ventricular arrhythmia episodes recorded. Patient bi-ventricularly pacing >99% of the time. Device programmed with appropriate safety margins. Heart failure diagnostics  reviewed and trends are stable for patient. Audible/vibratory alerts demonstrated for patient. Outputs changed to chronic settings with appropriate safety margins otherwise no changes made this session. Estimated longevity 6.3 years.   Assessment and Plan 1. Complete heart block in setting of an ischemic CM, LBBB s/p CRT-D implant March 2014 Normal device function No arrhythmias BiV pacing >99% of time Continue medical therapy for ischemic CM Continue routine device clinic follow-up every 3 months Return for follow-up with Dr. Graciela Husbands  2. Chronic systolic HF Stable; euvolemic by exam today Continue medical therapy 3. HTN BP elevated today but Luke [name] assures me that his BP at home is consistently <135/85 He will bring his BP log/diary in with him at his next visit along with his  BP monitor for comparison/calibration  Signed, Rick Duff, PA-C 10/22/2012, 6:03 PM

## 2012-10-23 ENCOUNTER — Other Ambulatory Visit: Payer: Self-pay | Admitting: Family Medicine

## 2012-10-23 ENCOUNTER — Other Ambulatory Visit (INDEPENDENT_AMBULATORY_CARE_PROVIDER_SITE_OTHER): Payer: Medicare Other

## 2012-10-23 DIAGNOSIS — I255 Ischemic cardiomyopathy: Secondary | ICD-10-CM

## 2012-10-23 DIAGNOSIS — I10 Essential (primary) hypertension: Secondary | ICD-10-CM

## 2012-10-23 DIAGNOSIS — I2589 Other forms of chronic ischemic heart disease: Secondary | ICD-10-CM

## 2012-10-23 LAB — BASIC METABOLIC PANEL
BUN: 26 mg/dL — ABNORMAL HIGH (ref 6–23)
CO2: 25 mEq/L (ref 19–32)
Calcium: 9.6 mg/dL (ref 8.4–10.5)
Chloride: 102 mEq/L (ref 96–112)
Creatinine, Ser: 1.2 mg/dL (ref 0.4–1.5)
GFR: 63.16 mL/min (ref >60.00)
Glucose, Bld: 207 mg/dL — ABNORMAL HIGH (ref 70–99)
Potassium: 4.9 mEq/L (ref 3.5–5.1)
Sodium: 135 mEq/L (ref 135–145)

## 2012-10-23 NOTE — Telephone Encounter (Signed)
Electronic refill request.  Please advise. 

## 2012-10-24 NOTE — Telephone Encounter (Signed)
Sent!

## 2012-10-27 ENCOUNTER — Other Ambulatory Visit: Payer: Self-pay | Admitting: *Deleted

## 2012-11-12 ENCOUNTER — Other Ambulatory Visit: Payer: Self-pay

## 2012-12-29 ENCOUNTER — Encounter: Payer: Self-pay | Admitting: Vascular Surgery

## 2012-12-30 ENCOUNTER — Encounter (INDEPENDENT_AMBULATORY_CARE_PROVIDER_SITE_OTHER): Payer: Medicare Other | Admitting: *Deleted

## 2012-12-30 ENCOUNTER — Encounter: Payer: Self-pay | Admitting: Vascular Surgery

## 2012-12-30 ENCOUNTER — Ambulatory Visit (INDEPENDENT_AMBULATORY_CARE_PROVIDER_SITE_OTHER): Payer: Medicare Other | Admitting: Vascular Surgery

## 2012-12-30 VITALS — BP 184/90 | HR 70 | Resp 16 | Ht 75.0 in | Wt 187.0 lb

## 2012-12-30 DIAGNOSIS — Z48812 Encounter for surgical aftercare following surgery on the circulatory system: Secondary | ICD-10-CM | POA: Insufficient documentation

## 2012-12-30 DIAGNOSIS — I714 Abdominal aortic aneurysm, without rupture: Secondary | ICD-10-CM

## 2012-12-30 DIAGNOSIS — Z8679 Personal history of other diseases of the circulatory system: Secondary | ICD-10-CM | POA: Insufficient documentation

## 2012-12-30 NOTE — Addendum Note (Signed)
Addended by: Adria Dill L on: 12/30/2012 02:38 PM   Modules accepted: Orders

## 2012-12-30 NOTE — Progress Notes (Signed)
[facility]  Patient name: Luke [name] MRN: [id number] DOB: 26-Apr-1946 Sex: male  REASON FOR VISIT: Follow up after EVAR.  HPI: Luke [name] is a 67 y.o. male who underwent EVAR of a 6 cm infrarenal abdominal aortic aneurysm on 12/03/2011. His last follow up visit was on 07/01/2012. At that time the graft was in good position and was no change in the size of his aneurysm. There was no evidence of endoleak. He comes in for a 6 month follow up visit.  VASCULAR QUALITY INITIATIVE FOLLOW UP DATA:  Current smoker: [  ] yes  Arly.Keller  ] no  Living status: [ X ]  Home  [  ] Nursing home  [  ] Homeless    MEDS:  ASA [ X ] yes  [  ] no- [  ] medical reason  [  ] non compliant  STATIN  Arly.Keller  ] yes  [  ] no- [  ] medical reason  [  ] non compliant  Beta blocker [ X ] yes  [  ] no- [  ] medical reason  [  ] non compliant  ACE inhibitor Arly.Keller  ] yes  [  ] no- [  ] medical reason  [  ] non compliant  P2Y12 Antagonist [ X ] none  [  ] clopidogrel-Plavix  [  ] ticlopidine-Ticlid   [  ] prasugrel-Effient  [  ] ticagrelor- Brilinta    Anticoagulant [ X ] None  [  ] warfarin  [  ] rivaroxaban-Xarelto [  ] dabigatran- Pradaxa  Current Max AAA = 5.0 mm  Endoleak:  [ X ] no  [  ] yes- [  ] type I  [  ] type II  [  ] type III  [  ] indeterminate  Number of new interventions: 0 -   Date:      Why:  [  ] endoleak  [  ] limb occlusion   [  ] growth  [  ]  Migration   [  ] symtomatic/ rupture    Conversion to open repair: [  ] yes   [ X ] no   Date:   Other operation related to EVAR:  [  ] yes   [ X ] no   Past Medical History  Diagnosis Date  . Diabetes mellitus     type II  . PAD (peripheral artery disease)   . AAA (abdominal aortic aneurysm) 2011    Per vascular surgery  . HLD (hyperlipidemia)   . Hypertension     white coat HTN-- often elevated in office and controlled on outside checks.  . Low testosterone     Hx of  . Cardiomyopathy     Nuclear, October 09, 2011, EF 30%, multiple focal wall motion abnormalities  . Ejection fraction < 50%     EF 30%, nuclear, October 09, 2011  . LBBB (left bundle branch block)     LBBB on EKG October 11, 2011,  no prior EKG has been done  . CAD (coronary artery disease)     Presumed CAD with nuclear scan October 09, 2011,  large anteroseptal MI and inferior MI. Catheterization scheduled October 15, 2011  . Anxiety     no med. in use for anxiety but pt. speaks openly of his stress & anxious feelings regarding impending surgery    . ICD (implantable cardiac defibrillator) battery depletion  CRT-Dplaced March, 20144 complete heart block and k dysfunction  . Ejection fraction < 50%     EF previously 30%  //   EF 30-35%, echo, July 14, 2012, severe diffuse hypokinesis, PA pressure 43 mm mercury  . Pacemaker    Family History  Problem Relation Age of Onset  . Hypertension Mother   . Diabetes Sister   . Heart disease Sister   . Hypertension Sister    SOCIAL HISTORY: History  Substance Use Topics  . Smoking status: Former Smoker    Types: Cigarettes    Quit date: 05/06/2004  . Smokeless tobacco: Never Used     Comment: quit about 7 years ago  . Alcohol Use: 1.2 oz/week    2 Cans of beer per week     Comment: beer or wine  occassionally   Allergies  Allergen Reactions  . Codeine Hives    Current Outpatient Prescriptions  Medication Sig Dispense Refill  . aspirin 325 MG tablet Take 325 mg by mouth daily.      . carvedilol (COREG) 12.5 MG tablet Take 1 tablet (12.5 mg total) by mouth 2 (two) times daily with a meal.  60 tablet  6  . lisinopril (PRINIVIL,ZESTRIL) 20 MG tablet Take 1 tablet (20 mg total) by mouth 2 (two) times daily.  180 tablet  3  . metFORMIN (GLUCOPHAGE) 500 MG tablet Take 2 tablets (1,000 mg total) by mouth 2 (two) times daily with a meal. HOLD for 2 days, then resume usual dose on 07/19/2012  360 tablet  3  . Multiple Vitamin (MULTIVITAMIN) tablet Take 1 tablet by mouth daily.        .  simvastatin (ZOCOR) 40 MG tablet Take 40 mg by mouth at bedtime.      Marland Kitchen VIAGRA 50 MG tablet TAKE ONE TABLET BY MOUTH ONCE DAILY AS NEEDED  10 tablet  12  . glipiZIDE (GLUCOTROL) 5 MG tablet Take 5 mg by mouth 2 (two) times daily before a meal.        No current facility-administered medications for this visit.    REVIEW OF SYSTEMS: Arly.Keller ] denotes positive finding; [  ] denotes negative finding  CARDIOVASCULAR:  [ ]  chest pain   [ ]  chest pressure   [ ]  palpitations   [ ]  orthopnea   [ ]  dyspnea on exertion   [ ]  claudication   [ ]  rest pain   [ ]  DVT   [ ]  phlebitis PULMONARY:   [ ]  productive cough   [ ]  asthma   [ ]  wheezing NEUROLOGIC:   [ ]  weakness  [ ]  paresthesias  [ ]  aphasia  [ ]  amaurosis  [ ]  dizziness HEMATOLOGIC:   [ ]  bleeding problems   [ ]  clotting disorders MUSCULOSKELETAL:  [ ]  joint pain   [ ]  joint swelling [ ]  leg swelling GASTROINTESTINAL: [ ]   blood in stool  [ ]   hematemesis GENITOURINARY:  [ ]   dysuria  [ ]   hematuria PSYCHIATRIC:  [ ]  history of major depression INTEGUMENTARY:  [ ]  rashes  [ ]  ulcers CONSTITUTIONAL:  [ ]  fever   [ ]  chills  PHYSICAL EXAM: Filed Vitals:   12/30/12 0955  BP: 184/90  Pulse: 70  Resp: 16  Height: 6\' 3"  (1.905 m)  Weight: 187 lb (84.823 kg)  SpO2: 99%   Body mass index is 23.37 kg/(m^2). GENERAL: The patient is a well-nourished male, in no acute distress. The vital signs are documented  above. CARDIOVASCULAR: There is a regular rate and rhythm. I do not detect carotid bruits. He has palpable femoral pulses. He has no significant lower extremity swelling. PULMONARY: There is good air exchange bilaterally without wheezing or rales. ABDOMEN: Soft and non-tender with normal pitched bowel sounds.  MUSCULOSKELETAL: There are no major deformities or cyanosis. NEUROLOGIC: No focal weakness or paresthesias are detected. SKIN: There are no ulcers or rashes noted. PSYCHIATRIC: The patient has a normal affect.  DATA:  I have  independently interpreted his duplex of his graft which shows that the maximum diameter of his aneurysm is 5 cm. The sac has thus decreased in size. There was no evidence of endoleak.  MEDICAL ISSUES: The patient is doing well status post EVAR. His aneurysm has shrunk in size. We'll see him back in one year. I have ordered a follow up duplex scan at that time. Fortunately he is not a smoker. He is very active and exercises quite a bit. His blood pressure was mildly elevated today but this only happened when he comes to the doctor's office. He follows his blood pressure closely and this has been under excellent control.  Vernita Tague S [facility] [place] [facility]: [phone]

## 2013-01-22 ENCOUNTER — Other Ambulatory Visit: Payer: Self-pay | Admitting: Family Medicine

## 2013-01-25 ENCOUNTER — Ambulatory Visit (INDEPENDENT_AMBULATORY_CARE_PROVIDER_SITE_OTHER): Payer: Medicare Other | Admitting: *Deleted

## 2013-01-25 DIAGNOSIS — I442 Atrioventricular block, complete: Secondary | ICD-10-CM

## 2013-01-25 LAB — ICD DEVICE OBSERVATION
AL AMPLITUDE: 3.6 mv
AL IMPEDENCE ICD: 462.5 Ohm
AL THRESHOLD: 0.5 V
ATRIAL PACING ICD: 22 pct
BAMS-0001: 150 {beats}/min
BAMS-0003: 70 {beats}/min
DEV-0020ICD: NEGATIVE
DEVICE MODEL ICD: 7097007
FVT: 0
HV IMPEDENCE: 74 Ohm
LV LEAD IMPEDENCE ICD: 1075 Ohm
LV LEAD THRESHOLD: 1.5 V
MODE SWITCH EPISODES: 0
PACEART VT: 0
RV LEAD AMPLITUDE: 12 mv
RV LEAD IMPEDENCE ICD: 462.5 Ohm
RV LEAD THRESHOLD: 0.75 V
TOT-0006: 20140313000000
TOT-0007: 0
TOT-0008: 0
TOT-0009: 0
TOT-0010: 2
TZAT-0001SLOWVT: 1
TZAT-0004SLOWVT: 8
TZAT-0012SLOWVT: 200 ms
TZAT-0013SLOWVT: 3
TZAT-0018SLOWVT: NEGATIVE
TZAT-0019SLOWVT: 7.5 V
TZAT-0020SLOWVT: 1 ms
TZON-0003SLOWVT: 300 ms
TZON-0004SLOWVT: 30
TZON-0005SLOWVT: 6
TZON-0010SLOWVT: 40 ms
TZST-0001SLOWVT: 2
TZST-0001SLOWVT: 3
TZST-0001SLOWVT: 4
TZST-0001SLOWVT: 5
TZST-0003SLOWVT: 845 V
TZST-0003SLOWVT: 890 V
TZST-0003SLOWVT: 890 V
VENTRICULAR PACING ICD: 99.9 pct
VF: 0

## 2013-01-25 NOTE — Progress Notes (Signed)
icd check in clinic. Battery longevity 6.1 years. Normal device function. No mode switch episodes and no ventricular high rate episodes. ROV 04-20-13 @ 1145 with SK.

## 2013-02-02 IMAGING — CR DG CHEST 1V PORT
1 series · 1 of 1 positions shown · non-contrast
Comparison: 11/27/2011.

CLINICAL DATA: Postop graft placement.

PORTABLE CHEST - 1 VIEW

[view not recorded]
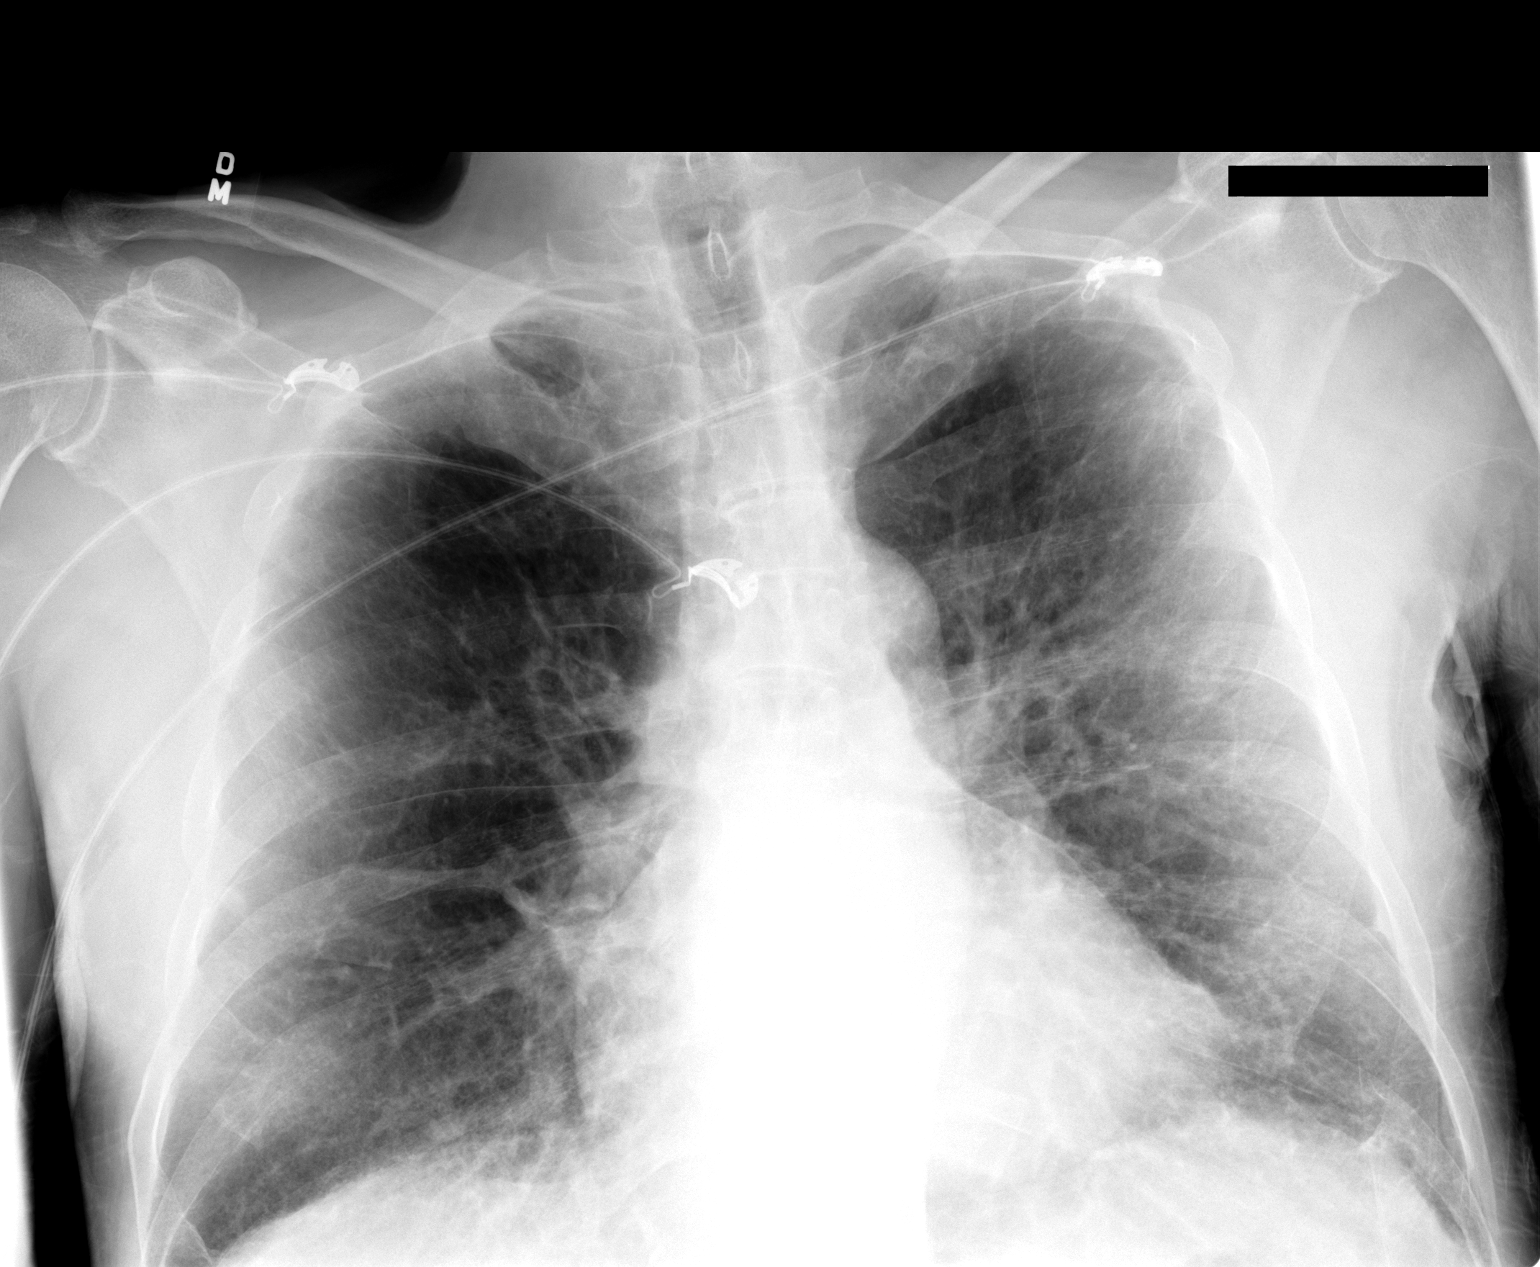

[1 of 1 positions shown; findings below may reference images not displayed]

FINDINGS: Trachea is midline.  Heart size normal.  Lungs are
emphysematous.  Mild accentuation of the pulmonary markings may be
due to lower lung volumes and semi upright technique. A nodular
density is seen in the periphery of the right mid lung zone.  No
pleural fluid.
IMPRESSION: 1.  Emphysema.  No definite acute findings.
2.  Nodular density in the peripheral right midlung zone.
Continued attention on follow-up exams is warranted.

## 2013-02-24 ENCOUNTER — Other Ambulatory Visit: Payer: Self-pay

## 2013-02-24 ENCOUNTER — Encounter: Payer: Self-pay | Admitting: Internal Medicine

## 2013-02-24 MED ORDER — LISINOPRIL 20 MG PO TABS: 20.0000 mg | ORAL_TABLET | Freq: Two times a day (BID) | ORAL | Status: DC

## 2013-03-03 IMAGING — CT CT CTA ABD/PEL W/CM AND/OR W/O CM
3 of 10 series · 12 of 36 positions shown, 17 images · IV contrast (80CC OMNI 350)
Comparison: 10/07/2011

CLINICAL DATA: Stent graft placement 12/03/2011.  Abdominal aortic
aneurysm.  Peripheral vascular disease.

CT ANGIOGRAPHY ABDOMEN AND PELVIS WITH CONTRAST AND WITHOUT
CONTRAST
Technique precontrast and multiphasic postcontrast imaging of the
abdomen pelvis was performed for vascular assessment.  Multiplanar
reformatting was performed.

[Series 4: angio · axial · 0.78mm/px · z∈[-384,-72]mm · 6 of 249 slices shown, 11 images]
[im 36/249  soft-tissue]
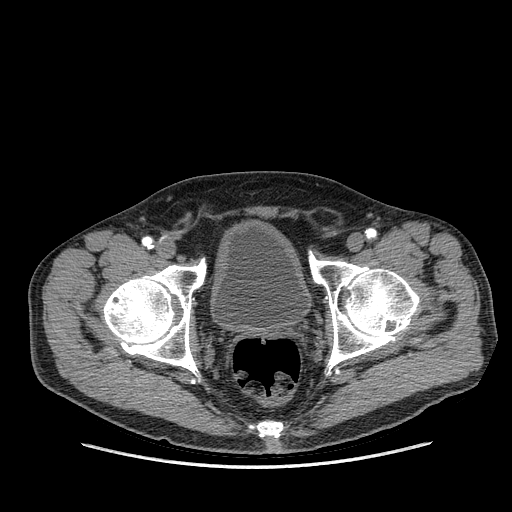
[im 36/249  bone]
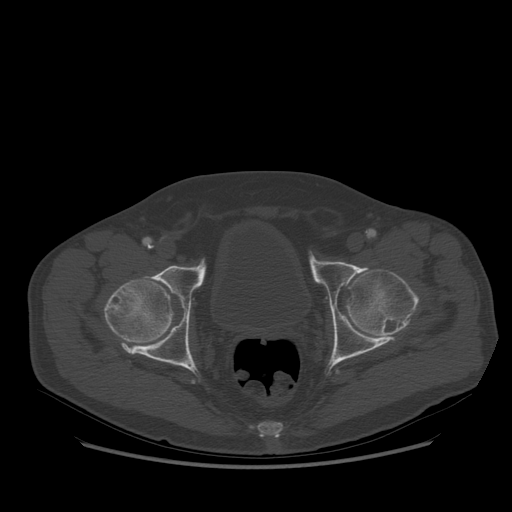
[im 71/249  soft-tissue]
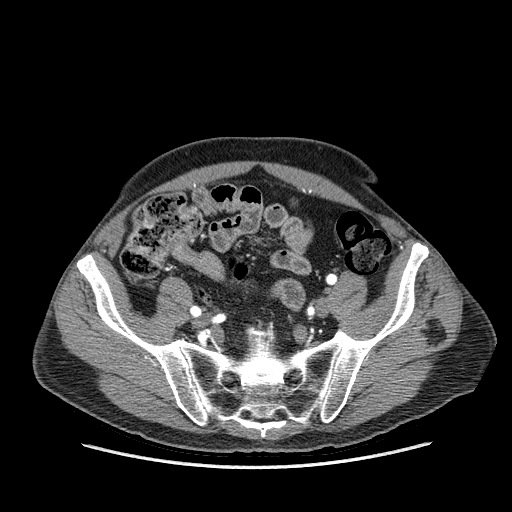
[im 107/249  soft-tissue]
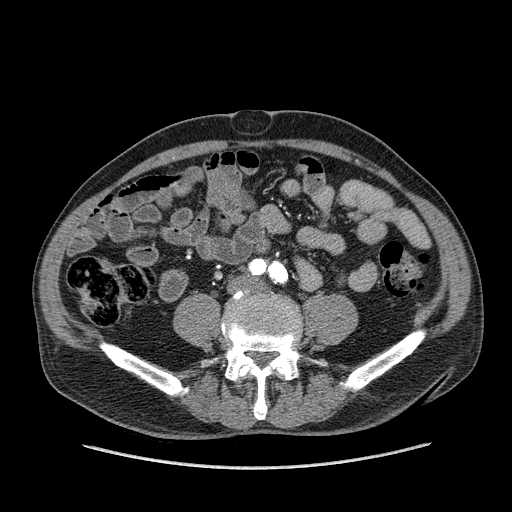
[im 107/249  lung]
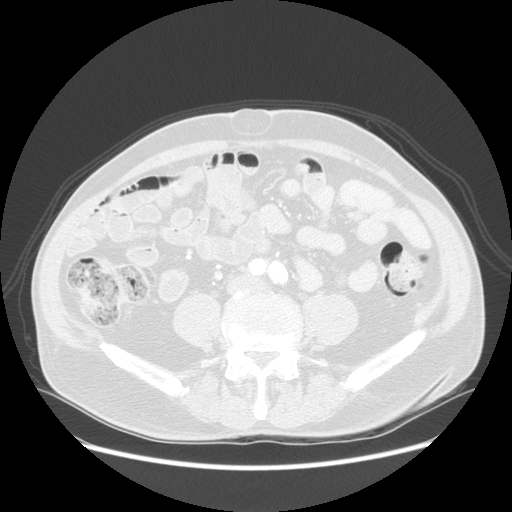
[im 142/249  soft-tissue]
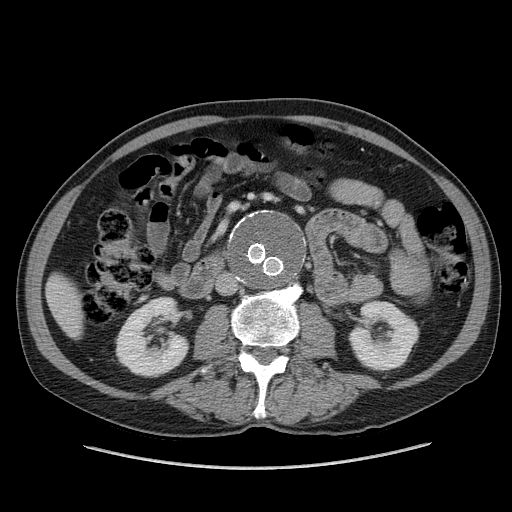
[im 142/249  lung]
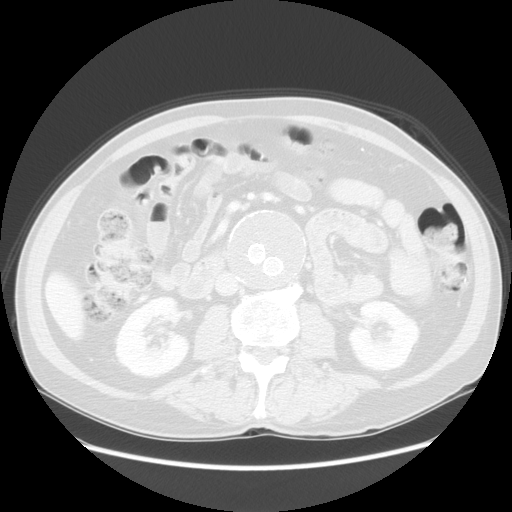
[im 178/249  soft-tissue]
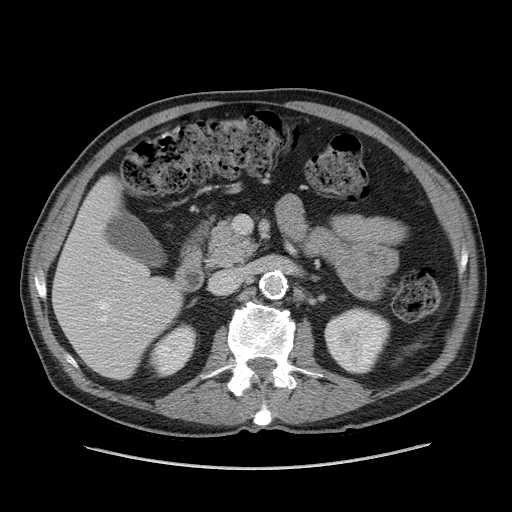
[im 178/249  lung]
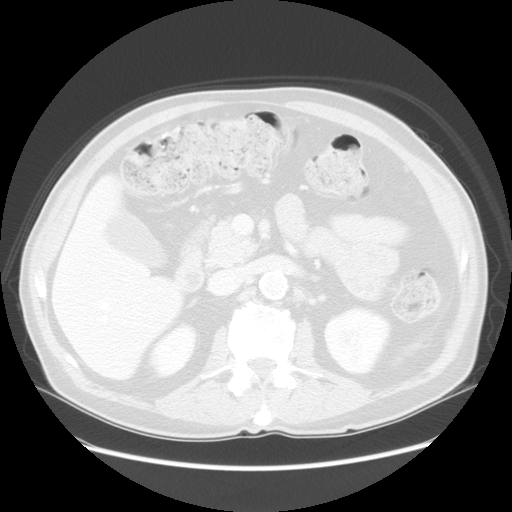
[im 213/249  soft-tissue]
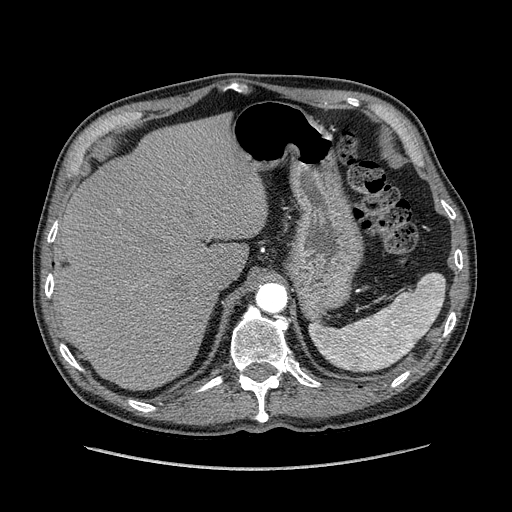
[im 213/249  lung]
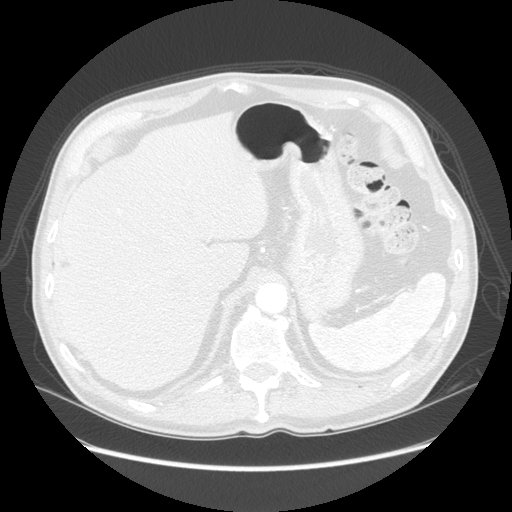

[Series 602: sagittal body · sagittal · 0.96mm/px · 3 of 152 slices shown (1 of 2)]
[im 38/152  soft-tissue]
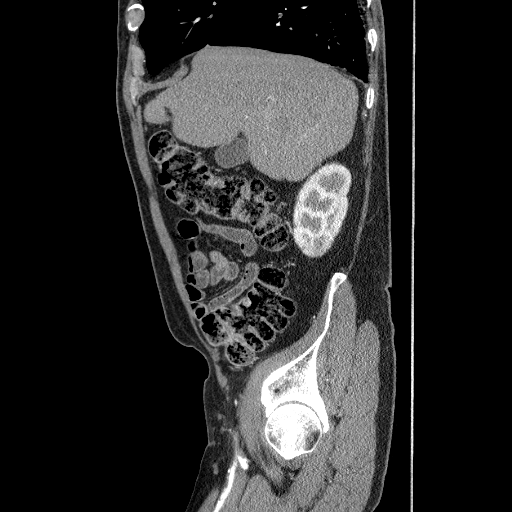
[im 76/152  soft-tissue]
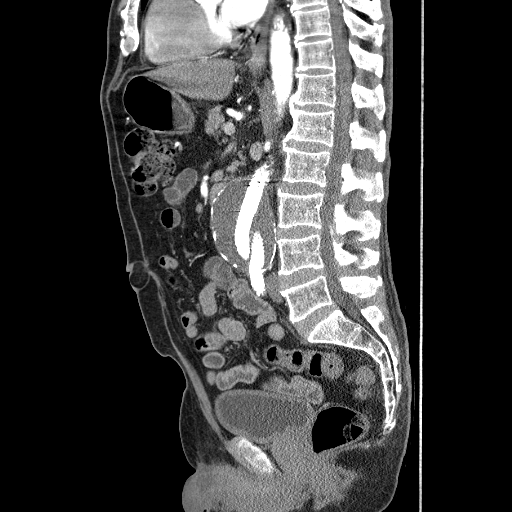
[im 114/152  soft-tissue]
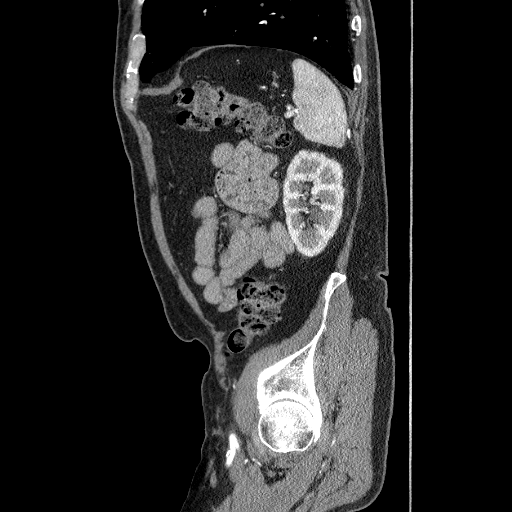

[Series 605: sagittal body · sagittal · 0.78mm/px · 3 of 153 slices shown (2 of 2)]
[im 39/153  soft-tissue]
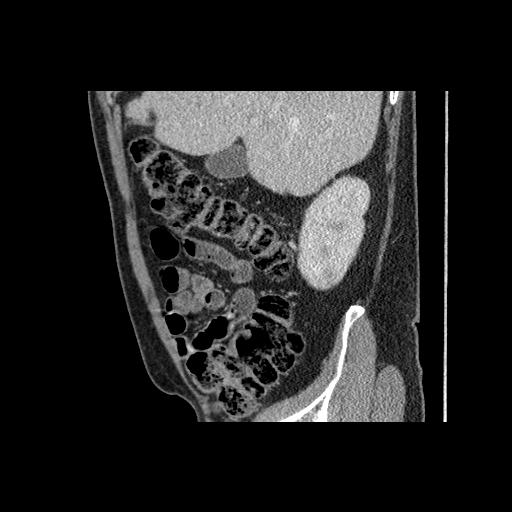
[im 77/153  soft-tissue]
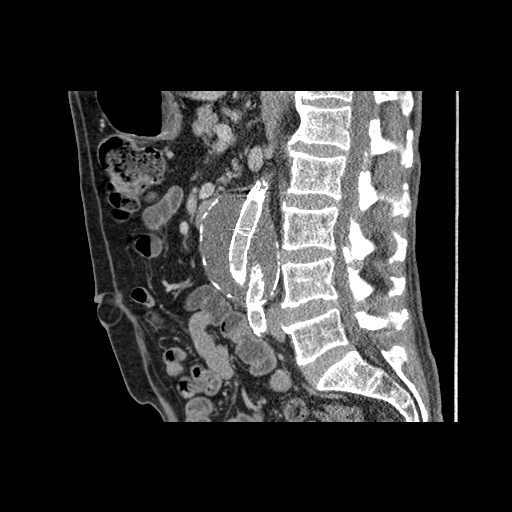
[im 115/153  soft-tissue]
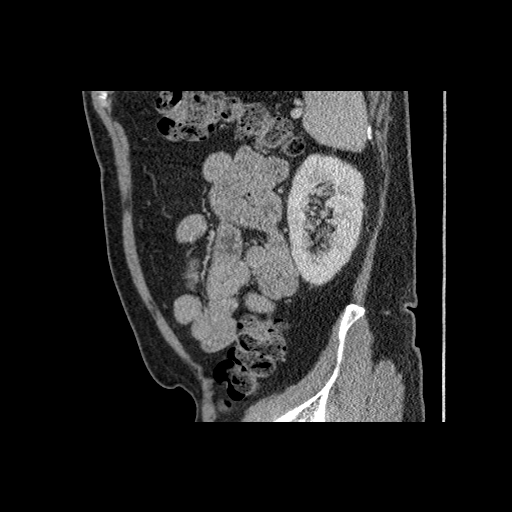

[12 of 36 positions shown; findings below may reference images not displayed]

FINDINGS: Intervally placed stent graft noted extending from just
below the renal arteries into the common iliac arteries.  No
observed endoleak.  Calcified thrombus in the surrounding
infrarenal abdominal aortic aneurysm noted without significant
change in aneurysmal size or contour.  Renal arteries appear
patent.  No dissection observed.  Slightly irregular chronic mural
thrombus noted in the descending thoracic aorta and in the
abdominal aorta above the level of the stent graft.  Celiac trunk
and SMA appear widely patent.  Proximal IMA appears to
reconstitute.

A 3.5 cm peripheral wedge-shaped region of hypodensity in the
spleen is compatible with a small peripheral splenic infarct.

Umbilical hernia contains adipose tissue.  Iliac vessels patent.
Atheromatous plaque noted posteriorly in the common femoral
arteries.

Small inguinal hernias contain adipose tissue.  Stable left gluteus
medius lipoma noted.

The liver, adrenal glands, and pancreas appear unremarkable.
Appendix appears normal.

Prominence of stool throughout the colon suggests constipation.  No
dilated bowel noted.  No ascites observed.  Slight urinary bladder
wall thickening is probably from nondistension.

Lumbar spondylosis noted with multilevel facet arthropathy.

Considerable degenerative arthropathy of the hips noted, with
spurring of the femoral heads and acetabula, degenerative
subcortical cyst formation, and loss of articular space.
IMPRESSION: 1.  Intervally placed stent graft appears satisfactorily
positioned, without evidence of endoleak.
2.  Small interval splenic infarct, without splenic artery
abnormality observed.
3.  Prominence of stool throughout the colon suggests constipation.

4.  Lumbar spondylosis.
5.  Degenerative arthropathy of the hips.
6.  Small umbilical hernia and small inguinal hernias contain
adipose tissue.

## 2013-03-09 ENCOUNTER — Other Ambulatory Visit: Payer: Self-pay | Admitting: Family Medicine

## 2013-03-09 DIAGNOSIS — E1159 Type 2 diabetes mellitus with other circulatory complications: Secondary | ICD-10-CM

## 2013-03-11 ENCOUNTER — Other Ambulatory Visit (INDEPENDENT_AMBULATORY_CARE_PROVIDER_SITE_OTHER): Payer: Medicare Other

## 2013-03-11 ENCOUNTER — Other Ambulatory Visit: Payer: Self-pay

## 2013-03-11 DIAGNOSIS — E1159 Type 2 diabetes mellitus with other circulatory complications: Secondary | ICD-10-CM

## 2013-03-11 DIAGNOSIS — I10 Essential (primary) hypertension: Secondary | ICD-10-CM

## 2013-03-11 DIAGNOSIS — I798 Other disorders of arteries, arterioles and capillaries in diseases classified elsewhere: Secondary | ICD-10-CM

## 2013-03-11 DIAGNOSIS — E785 Hyperlipidemia, unspecified: Secondary | ICD-10-CM

## 2013-03-11 LAB — HEPATIC FUNCTION PANEL
ALT: 22 U/L (ref 0–53)
AST: 19 U/L (ref 0–37)
Albumin: 3.8 g/dL (ref 3.5–5.2)
Alkaline Phosphatase: 52 U/L (ref 39–117)
Bilirubin, Direct: 0 mg/dL (ref 0.0–0.3)
Total Bilirubin: 0.5 mg/dL (ref 0.3–1.2)
Total Protein: 6.9 g/dL (ref 6.0–8.3)

## 2013-03-12 LAB — HEMOGLOBIN A1C: Hgb A1c MFr Bld: 7.3 % — ABNORMAL HIGH (ref 4.6–6.5)

## 2013-03-18 ENCOUNTER — Encounter: Payer: Self-pay | Admitting: Family Medicine

## 2013-03-18 ENCOUNTER — Ambulatory Visit (INDEPENDENT_AMBULATORY_CARE_PROVIDER_SITE_OTHER): Payer: Medicare Other | Admitting: Family Medicine

## 2013-03-18 VITALS — BP 164/80 | HR 80 | Temp 97.5°F | Ht 75.0 in | Wt 190.0 lb

## 2013-03-18 DIAGNOSIS — Z0000 Encounter for general adult medical examination without abnormal findings: Secondary | ICD-10-CM

## 2013-03-18 DIAGNOSIS — K429 Umbilical hernia without obstruction or gangrene: Secondary | ICD-10-CM | POA: Diagnosis not present

## 2013-03-18 DIAGNOSIS — I10 Essential (primary) hypertension: Secondary | ICD-10-CM

## 2013-03-18 DIAGNOSIS — E785 Hyperlipidemia, unspecified: Secondary | ICD-10-CM

## 2013-03-18 DIAGNOSIS — E1159 Type 2 diabetes mellitus with other circulatory complications: Secondary | ICD-10-CM | POA: Diagnosis not present

## 2013-03-18 DIAGNOSIS — Z23 Encounter for immunization: Secondary | ICD-10-CM | POA: Diagnosis not present

## 2013-03-18 DIAGNOSIS — I798 Other disorders of arteries, arterioles and capillaries in diseases classified elsewhere: Secondary | ICD-10-CM

## 2013-03-18 NOTE — Patient Instructions (Addendum)
Luke [name] will call about your referral. Check with your insurance to see if they will cover the shingles shot. Call about the colonoscopy.   Have the eye clinic send a report after your exam.  Take care.  Recheck labs in about 6 months before a visit.  Glad to see you.

## 2013-03-18 NOTE — Progress Notes (Signed)
Pre-visit discussion using our clinic review tool. No additional management support is needed unless otherwise documented below in the visit note.  I have personally reviewed the Medicare Annual Wellness questionnaire and have noted 1. The patient's medical and social history 2. Their use of alcohol, tobacco or illicit drugs 3. Their current medications and supplements 4. The patient's functional ability including ADL's, fall risks, home safety risks and hearing or visual             impairment. 5. Diet and physical activities 6. Evidence for depression or mood disorders  The patients weight, height, BMI have been recorded in the chart and visual acuity is per eye clinic.  I have made referrals, counseling and provided education to the patient based review of the above and I have provided the pt with a written personalized care plan for preventive services.  See scanned forms.  Routine anticipatory guidance given to patient.  See health maintenance. Flu discussed with patient.  Shingles encouraged  PNA 2013 Tetanus 2012 Colon 2011, due for f/u.  He'll call about that.  Prostate cancer screening and PSA options (with potential risks and benefits of testing vs not testing) were discussed along with recent recs/guidelines.  He declined testing PSA at this point.   Advance directive- wife would be designated if incapacitated.   Cognitive function addressed- see scanned forms- and if abnormal then additional documentation follows.   Diabetes:  Using medications without difficulties:yes Hypoglycemic episodes:very rare Hyperglycemic episodes:no Feet problems:see below, no acute complaints Blood Sugars controlled on home checks eye exam within last year: f/u pending.   Hypertension:    Using medication without problems or lightheadedness: yes Chest pain with exertion:n Edema:n Short of breath:n Average home BPs: controlled on home checks, always higher here in clinic.   Elevated  Cholesterol: Using medications without problems:yes Muscle aches: no Diet compliance:yes Exercise:yes  PMH and SH reviewed.   Vital signs, Meds and allergies reviewed.  ROS: See HPI.  Otherwise nontributory.   GEN: nad, alert and oriented HEENT: mucous membranes moist NECK: supple w/o LA CV: rrr.  PULM: ctab, no inc wob ABD: soft, +bs, soft umbilical hernia noted EXT: no edema SKIN: no acute rash but benign appearing SKs noted.   Diabetic foot exam: Normal inspection No skin breakdown but dry skin  Calluses noted on the balls of the feet Dec DP pulses Normal sensation to light tough and monofilament Nails thickened

## 2013-03-19 DIAGNOSIS — K420 Umbilical hernia with obstruction, without gangrene: Secondary | ICD-10-CM | POA: Insufficient documentation

## 2013-03-19 NOTE — Assessment & Plan Note (Signed)
Controlled out of clinic, continue as is.

## 2013-03-19 NOTE — Assessment & Plan Note (Signed)
Not acutely ill but annoying to patient, refer for eval.  He agrees.

## 2013-03-19 NOTE — Assessment & Plan Note (Signed)
Prev at goal except for TG, continue current med and diet/exercise.

## 2013-03-19 NOTE — Assessment & Plan Note (Signed)
Reasonable control, continue diet and current meds.  Okay for routine f/u.  Labs d/w pt.

## 2013-03-19 NOTE — Assessment & Plan Note (Signed)
See scanned forms.  Routine anticipatory guidance given to patient.  See health maintenance. Flu discussed with patient.  Shingles encouraged  PNA 2013 Tetanus 2012 Colon 2011, due for f/u.  He'll call about that.  Prostate cancer screening and PSA options (with potential risks and benefits of testing vs not testing) were discussed along with recent recs/guidelines.  He declined testing PSA at this point.   Advance directive- wife would be designated if incapacitated.   Cognitive function addressed- see scanned forms- and if abnormal then additional documentation follows.

## 2013-03-31 ENCOUNTER — Encounter (INDEPENDENT_AMBULATORY_CARE_PROVIDER_SITE_OTHER): Payer: Self-pay | Admitting: Surgery

## 2013-03-31 ENCOUNTER — Ambulatory Visit (INDEPENDENT_AMBULATORY_CARE_PROVIDER_SITE_OTHER): Payer: Medicare Other | Admitting: Surgery

## 2013-03-31 ENCOUNTER — Encounter (INDEPENDENT_AMBULATORY_CARE_PROVIDER_SITE_OTHER): Payer: Self-pay

## 2013-03-31 VITALS — BP 200/110 | HR 60 | Temp 97.8°F | Resp 14 | Ht 75.0 in | Wt 188.2 lb

## 2013-03-31 DIAGNOSIS — I2589 Other forms of chronic ischemic heart disease: Secondary | ICD-10-CM | POA: Diagnosis not present

## 2013-03-31 DIAGNOSIS — K420 Umbilical hernia with obstruction, without gangrene: Secondary | ICD-10-CM | POA: Diagnosis not present

## 2013-03-31 DIAGNOSIS — I255 Ischemic cardiomyopathy: Secondary | ICD-10-CM

## 2013-03-31 DIAGNOSIS — Z95810 Presence of automatic (implantable) cardiac defibrillator: Secondary | ICD-10-CM

## 2013-03-31 NOTE — Patient Instructions (Signed)
See the Handout(s) we gave you.  Consider surgery.  Please call our office at [phone] if you wish to schedule surgery or if you have further questions / concerns.   Please check with your cardiologist to make sure that he feels it is safe to go through surgery.  Also advice on how to manage the ICD defibrillator around the time of surgery  Hernia A hernia occurs when an internal organ pushes out through a weak spot in the abdominal wall. Hernias most commonly occur in the groin and around the navel. Hernias often can be pushed back into place (reduced). Most hernias tend to get worse over time. Some abdominal hernias can get stuck in the opening (irreducible or incarcerated hernia) and cannot be reduced. An irreducible abdominal hernia which is tightly squeezed into the opening is at risk for impaired blood supply (strangulated hernia). A strangulated hernia is a medical emergency. Because of the risk for an irreducible or strangulated hernia, surgery may be recommended to repair a hernia. CAUSES   Heavy lifting.  Prolonged coughing.  Straining to have a bowel movement.  A cut (incision) made during an abdominal surgery. HOME CARE INSTRUCTIONS   Bed rest is not required. You may continue your normal activities.  Avoid lifting more than 10 pounds (4.5 kg) or straining.  Cough gently. If you are a smoker it is best to stop. Even the best hernia repair can break down with the continual strain of coughing. Even if you do not have your hernia repaired, a cough will continue to aggravate the problem.  Do not wear anything tight over your hernia. Do not try to keep it in with an outside bandage or truss. These can damage abdominal contents if they are trapped within the hernia sac.  Eat a normal diet.  Avoid constipation. Straining over long periods of time will increase hernia size and encourage breakdown of repairs. If you cannot do this with diet alone, stool softeners may be  used. SEEK IMMEDIATE MEDICAL CARE IF:   You have a fever.  You develop increasing abdominal pain.  You feel nauseous or vomit.  Your hernia is stuck outside the abdomen, looks discolored, feels hard, or is tender.  You have any changes in your bowel habits or in the hernia that are unusual for you.  You have increased pain or swelling around the hernia.  You cannot push the hernia back in place by applying gentle pressure while lying down. MAKE SURE YOU:   Understand these instructions.  Will watch your condition.  Will get help right away if you are not doing well or get worse. Document Released: 04/22/2005 Document Revised: 07/15/2011 Document Reviewed: 12/10/2007 [facility] Patient Information 2014 [place], [place].   HERNIA REPAIR: POST OP INSTRUCTIONS  1. DIET: Follow a light bland diet the first 24 hours after arrival home, such as soup, liquids, crackers, etc.  Be sure to include lots of fluids daily.  Avoid fast food or heavy meals as your are more likely to get nauseated.  Eat a low fat the next few days after surgery. 2. Take your usually prescribed home medications unless otherwise directed. 3. PAIN CONTROL: a. Pain is best controlled by a usual combination of three different methods TOGETHER: i. Ice/Heat ii. Over the counter pain medication iii. Prescription pain medication b. Most patients will experience some swelling and bruising around the hernia(s) such as the bellybutton, groins, or old incisions.  Ice packs or heating pads (30-60 minutes up to 6  times a day) will help. Use ice for the first few days to help decrease swelling and bruising, then switch to heat to help relax tight/sore spots and speed recovery.  Some people prefer to use ice alone, heat alone, alternating between ice & heat.  Experiment to what works for you.  Swelling and bruising can take several weeks to resolve.   c. It is helpful to take an over-the-counter pain medication regularly for the  first few weeks.  Choose one of the following that works best for you: i. Naproxen (Aleve, etc)  Two 220mg  tabs twice a day ii. Ibuprofen (Advil, etc) Three 200mg  tabs four times a day (every meal & bedtime) iii. Acetaminophen (Tylenol, etc) 325-650mg  four times a day (every meal & bedtime) d. A  prescription for pain medication should be given to you upon discharge.  Take your pain medication as prescribed.  i. If you are having problems/concerns with the prescription medicine (does not control pain, nausea, vomiting, rash, itching, etc), please call us [phone] to see if we need to switch you to a different pain medicine that will work better for you and/or control your side effect better. ii. If you need a refill on your pain medication, please contact your pharmacy.  They will contact our office to request authorization. Prescriptions will not be filled after 5 pm or on week-ends. 4. Avoid getting constipated.  Between the surgery and the pain medications, it is common to experience some constipation.  Increasing fluid intake and taking a fiber supplement (such as Metamucil, Citrucel, FiberCon, MiraLax, etc) 1-2 times a day regularly will usually help prevent this problem from occurring.  A mild laxative (prune juice, Milk of Magnesia, MiraLax, etc) should be taken according to package directions if there are no bowel movements after 48 hours.   5. Wash / shower every day.  You may shower over the dressings as they are waterproof.   6. Remove your waterproof bandages 5 days after surgery.  You may leave the incision open to air.  You may replace a dressing/Band-Aid to cover the incision for comfort if you wish.  Continue to shower over incision(s) after the dressing is off.    7. ACTIVITIES as tolerated:   a. You may resume regular (light) daily activities beginning the next day-such as daily self-care, walking, climbing stairs-gradually increasing activities as tolerated.  If you can walk 30  minutes without difficulty, it is safe to try more intense activity such as jogging, treadmill, bicycling, low-impact aerobics, swimming, etc. b. Save the most intensive and strenuous activity for last such as sit-ups, heavy lifting, contact sports, etc  Refrain from any heavy lifting or straining until you are off narcotics for pain control.   c. DO NOT PUSH THROUGH PAIN.  Let pain be your guide: If it hurts to do something, don't do it.  Pain is your body warning you to avoid that activity for another week until the pain goes down. d. You may drive when you are no longer taking prescription pain medication, you can comfortably wear a seatbelt, and you can safely maneuver your car and apply brakes. e. Bonita Quin may have sexual intercourse when it is comfortable.  8. FOLLOW UP in our office a. Please call [facility] at [phone] to set up an appointment to see your surgeon in the office for a follow-up appointment approximately 2-3 weeks after your surgery. b. Make sure that you call for this appointment the day you arrive home to  insure a convenient appointment time. 9.  IF YOU HAVE DISABILITY OR FAMILY LEAVE FORMS, BRING THEM TO THE OFFICE FOR PROCESSING.  DO NOT GIVE THEM TO YOUR DOCTOR.  WHEN TO CALL us [phone]: 1. Poor pain control 2. Reactions / problems with new medications (rash/itching, nausea, etc)  3. Fever over 101.5 F (38.5 C) 4. Inability to urinate 5. Nausea and/or vomiting 6. Worsening swelling or bruising 7. Continued bleeding from incision. 8. Increased pain, redness, or drainage from the incision   The clinic staff is available to answer your questions during regular business hours (8:30am-5pm).  Please don't hesitate to call and ask to speak to one of our nurses for clinical concerns.   If you have a medical emergency, go to the nearest emergency room or call 911.  A surgeon from [facility] is always on call at the hospitals in [facility], [place] [address], [address], [place], [place]  [address] ?  [address] [address], [place], [place]   [address] MAIN: [phone] ? TOLL FREE: [phone] ? FAX: [phone] [email or web address]  Managing Pain  Pain after surgery or related to activity is often due to strain/injury to muscle, tendon, nerves and/or incisions.  This pain is usually short-term and will improve in a few months.   Many people find it helpful to do the following things TOGETHER to help speed the process of healing and to get back to regular activity more quickly:  1. Avoid heavy physical activity a.  no lifting greater than 20 pounds b. Do not "push through" the pain.  Listen to your body and avoid positions and maneuvers than reproduce the pain c. Walking is okay as tolerated, but go slowly and stop when getting sore.  d. Remember: If it hurts to do it, then don't do it! 2. Take Anti-inflammatory medication  a. Take with food/snack around the clock for 1-2 weeks i. This helps the muscle and nerve tissues become less irritable and calm down faster b. Choose ONE of the following over-the-counter medications: i. Naproxen 220mg  tabs (ex. Aleve) 1-2 pills twice a day  ii. Ibuprofen 200mg  tabs (ex. Advil, Motrin) 3-4 pills with every meal and just before bedtime iii. Acetaminophen 500mg  tabs (Tylenol) 1-2 pills with every meal and just before bedtime 3. Use a Heating pad or Ice/Cold Pack a. 4-6 times a day b. May use warm bath/hottub  or showers 4. Try Gentle Massage and/or Stretching  a. at the area of pain many times a day b. stop if you feel pain - do not overdo it  Try these steps together to help you body heal faster and avoid making things get worse.  Doing just one of these things may not be enough.    If you are not getting better after two weeks or are noticing you are getting worse, contact our office for further advice; we may need to re-evaluate you & see what other things we can  do to help.  Cardioverter Defibrillator Implantation, Care After Refer to this sheet in the next few weeks. These instructions provide you with information on caring for yourself after you have had an implantable cardioverter defibrillator (ICD) put (implanted) in your chest. Your caregiver may also give you more specific instructions. Your treatment has been planned according to current medical practices, but problems sometimes occur. Call your caregiver if you have any problems or questions after your procedure.  HOME CARE INSTRUCTIONS  Medications  Only take  your regular medications as directed by your caregiver. Make sure you receive specific instructions about when to take them and how much to take, especially if they were stopped or changed.   Only take over-the-counter or prescription medications as directed by your caregiver.   If antibiotic medications were prescribed, take them as directed. Finish them even if you start to feel better.   Do not take any other medications without asking your caregiver first. Some medications, including certain painkillers, can cause bleeding after surgery.  Wound Care  Take off the bandage as directed after you are home (usually 1 2 days after surgery).  You may have pieces of tape called skin adhesive strips over the area where the cut was made (incision site). Let them fall off on their own.   Check the incision site every day to make sure it is not infected, bleeding, or starting to pull apart.   Do not use lotions or ointments near the incision site unless directed to do so.   Keep the incision area clean and dry for 2 3 days after the procedure or as directed by your caregiver. It takes several weeks for the incision site to completely heal.   Follow the specific bathing instructions provided by your caregiver. Do not take a bath until your caregiver says it is OK.  Activities  Try to walk a little every day. Exercising is  important after this procedure. It is also important to use your shoulder on the side of the ICD in daily tasks that do not require exaggerated motion.   Avoid sudden jerking, pulling, or chopping movements that pull your upper arm far away from your body for at least 6 weeks.   Do not lift your upper arm above your shoulders for at least 6 weeks. This means no tennis, golf, or swimming for this period of time. If you sleep with the arm above your head, use a restraint to prevent this from happening as you sleep.   You may go back to work when your caregiver says it is OK. Check with your caregiver before you start to drive or play sports.  Other Instructions  Follow diet instructions if they were provided. You should be able to eat what you usually do right away, but you may need to limit your salt intake.   Weigh yourself every day. If you suddenly gain weight, fluid may be building up in your body.   Always carry your ICD identification card with you. The card should list the implant date, device model, and manufacturer. Consider wearing a medical alert bracelet or necklace.   Tell all caregivers that you have an ICD. This may prevent them from giving you a magnetic resonance imaging (MRI) scan because of the strong magnets used during that test.   If you must pass through a metal detector, quickly walk through it. Do not stop under the detector or stand near it.   Avoid places or objects with a strong electric or magnetic field, including:   Scientist, physiological. When at the airport, let officials know you have an ICD. Your ID card will let you be checked in a way that is safe for you and that will not damage your ICD. Also, do not let a security person wave a magnetic wand near your ICD. That can make it stop working.   Power plants.   Large electrical generators.   Radiofrequency transmission towers, such as cell phone and radio towers.  Do not use amateur (ham)  radio equipment or electric (arc) welding torches. Some devices are safe to use if held at least 1 foot from your ICD. These include power tools, lawn mowers, and speakers. If you are unsure of whether something is safe to use, ask your caregiver.   You may safely use electric blankets, heating pads, computers, and microwave ovens.   Hold cell phones and remote controls at least 1 foot away from your ICD. When using your cell phone, hold it to the ear opposite of the ICD. Do not leave your cell phone in a pocket over the ICD.   Keep all follow-up appointments. This is how your caregiver makes sure your chest is healing the way it should. Ask your caregiver when you should come back to have your stitches or staples taken out.   Have your ICD checked every 3 6 months or as directed by your caregiver. Most ICDs last for 4 8 years before a new one is needed. SEEK MEDICAL CARE IF:   You feel one shock in your chest.  You gain weight all of a sudden.   Your legs or feet swell more than they have before.   It feels like your heart is fluttering or skipping beats (heart palpitations). SEEK IMMEDIATE MEDICAL CARE IF:   You have chest pain.  You feel more than one shock.  You feel more short of breath than you have felt before.  You feel more lightheaded than you have felt before.  You have problems with your incision site, such as swelling or bleeding, or it starts to open up.   You notice signs of infection around your incision site. Watch for:   Warmth.   Redness.   Worsening pain.   Swelling.   Fluid leaking from the incision site.   You have a fever.  MAKE SURE YOU:  Understand these instructions.  Will watch your condition.  Will get help right away if you are not doing well or get worse. Document Released: 11/09/2004 Document Revised: 12/23/2012 Document Reviewed: 05/13/2012 [facility] Patient Information 2014 [place], [place].

## 2013-03-31 NOTE — Progress Notes (Signed)
Subjective:     Patient ID: Luke [name], male   DOB: 15-Mar-1946, 67 y.o.   MRN: [id number]  HPI  Luke [name]  17-Aug-1945 [id number]  Patient Care Team: [name], MD as PCP - General [name], MD as Attending Physician (Vascular Surgery) Luis Abed, MD (Cardiology) Willis Modena, MD as Consulting Physician (Gastroenterology)  This patient is a 66 y.o.male who presents today for surgical evaluation at the request of Dr. Para March.   Reason for visit: Symptomatic umbilical hernia  Pleasant active male.  History of stable coronary disease with a defibrillator and good exercise tolerance.  History of abdominal aortic aneurysm status post successful stenting last year.  He has had a lump at his belly button for the past decade.  He usually has not been bothered by it.  However, he has noticed his gotten larger.  It has gotten more sensitive.  He has been working out at [employer] a more regularly to keep his weight down and improve his overall health.  He notes after doing sit-ups and workout, the lump is more sensitive.  It is out all the time now.  He can walk 2 miles without difficulty.  Has a bowel movement every day.  History of polyps in the past.  Due for followup colonoscopy with Dr. Dulce Sellar with [facility] gastroenterology soon.  No personal nor family history of GI/colon cancer, inflammatory bowel disease, irritable bowel syndrome, allergy such as Celiac Sprue, dietary/dairy problems, colitis, ulcers nor gastritis.  No recent sick contacts/gastroenteritis.  No travel outside the country.  No changes in diet.  2 history of MRSA or skin infections.  No history of abdominal surgeries.    Patient Active Problem List   Diagnosis Date Noted  . Umbilical hernia 03/19/2013  . Aftercare following surgery of the circulatory system, NEC 12/30/2012  . Abdominal aneurysm without mention of rupture 12/30/2012  . HLD (hyperlipidemia) 09/16/2012  . ICD (implantable cardiac  defibrillator) battery depletion   . Ejection fraction < 50%   . Complete heart block 07/14/2012  . SK (seborrheic keratosis) 02/12/2012  . Medicare annual wellness visit, initial 11/08/2011  . Shoulder pain 11/08/2011  . CAD (coronary artery disease)   . Ischemic cardiomyopathy   . LBBB (left bundle branch block)   . AAA (abdominal aortic aneurysm) 10/08/2010  . PVD (peripheral vascular disease) 10/08/2010  . ORGANIC IMPOTENCE 07/05/2010  . ESSENTIAL HYPERTENSION, BENIGN 02/27/2010  . Type 2 diabetes mellitus with vascular disease 11/16/2009    Past Medical History  Diagnosis Date  . Diabetes mellitus     type II  . PAD (peripheral artery disease)   . AAA (abdominal aortic aneurysm) 2011    Per vascular surgery  . HLD (hyperlipidemia)   . Hypertension     white coat HTN-- often elevated in office and controlled on outside checks.  . Low testosterone     Hx of  . Cardiomyopathy     Nuclear, October 09, 2011, EF 30%, multiple focal wall motion abnormalities  . Ejection fraction < 50%     EF 30%, nuclear, October 09, 2011  . LBBB (left bundle branch block)     LBBB on EKG October 11, 2011,  no prior EKG has been done  . CAD (coronary artery disease)     Presumed CAD with nuclear scan October 09, 2011,  large anteroseptal MI and inferior MI. Catheterization scheduled October 15, 2011  . Anxiety     no med. in use  for anxiety but pt. speaks openly of his stress & anxious feelings regarding impending surgery    . ICD (implantable cardiac defibrillator) battery depletion     CRT-Dplaced March, 20144 complete heart block and k dysfunction  . Ejection fraction < 50%     EF previously 30%  //   EF 30-35%, echo, July 14, 2012, severe diffuse hypokinesis, PA pressure 43 mm mercury  . Pacemaker   . Myocardial infarction     Past Surgical History  Procedure Laterality Date  . Tonsillectomy      as a child   . Colonoscopy    . Cardiac catheterization    . Abdominal aortic aneurysm repair       EVAR   . Pacemaker insertion  07-16-12    History   Social History  . Marital Status: Married    Spouse Name: N/A    Number of Children: N/A  . Years of Education: N/A   Occupational History  . Not on file.   Social History Main Topics  . Smoking status: Former Smoker    Types: Cigarettes    Quit date: 05/06/2004  . Smokeless tobacco: Never Used     Comment: quit about 7 years ago  . Alcohol Use: 1.2 oz/week    2 Cans of beer per week     Comment: beer or wine  occassionally  . Drug Use: No  . Sexual Activity: Not on file   Other Topics Concern  . 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 Hypertension Mother   . Stroke Mother   . Diabetes Sister   . Heart disease Sister   . Hypertension Sister   . Colon cancer Neg Hx   . Prostate cancer Neg Hx   . Heart disease Father     Current Outpatient Prescriptions  Medication Sig Dispense Refill  . aspirin 325 MG tablet Take 325 mg by mouth daily.      . carvedilol (COREG) 12.5 MG tablet Take 1 tablet (12.5 mg total) by mouth 2 (two) times daily with a meal.  60 tablet  6  . glipiZIDE (GLUCOTROL) 5 MG tablet take 1 tablet by mouth twice a day BEFORE A MEAL  180 tablet  3  . lisinopril (PRINIVIL,ZESTRIL) 20 MG tablet Take 1 tablet (20 mg total) by mouth 2 (two) times daily.  180 tablet  3  . metFORMIN (GLUCOPHAGE) 500 MG tablet Take 2 tablets (1,000 mg total) by mouth 2 (two) times daily with a meal. HOLD for 2 days, then resume usual dose on 07/19/2012  360 tablet  3  . Multiple Vitamin (MULTIVITAMIN) tablet Take 1 tablet by mouth daily.        . simvastatin (ZOCOR) 40 MG tablet Take 40 mg by mouth at bedtime.      Marland Kitchen VIAGRA 50 MG tablet TAKE ONE TABLET BY MOUTH ONCE DAILY AS NEEDED  10 tablet  12   No current facility-administered medications for this visit.     Allergies  Allergen Reactions  . Codeine Hives    BP 200/110  Pulse 60   Temp(Src) 97.8 F (36.6 C) (Temporal)  Resp 14  Ht 6\' 3"  (1.905 m)  Wt 188 lb 3.2 oz (85.367 kg)  BMI 23.52 kg/m2  No results found.   Review of Systems  Constitutional: Negative for fever, chills and diaphoresis.  HENT: Negative for  ear discharge, facial swelling, mouth sores, nosebleeds, sore throat and trouble swallowing.   Eyes: Negative for photophobia, discharge and visual disturbance.  Respiratory: Negative for choking, chest tightness, shortness of breath and stridor.   Cardiovascular: Negative for chest pain and palpitations.  Gastrointestinal: Negative for nausea, vomiting, diarrhea, constipation, blood in stool, abdominal distention, anal bleeding and rectal pain.  Endocrine: Negative for cold intolerance and heat intolerance.  Genitourinary: Negative for dysuria, urgency, difficulty urinating and testicular pain.  Musculoskeletal: Negative for arthralgias, back pain, gait problem and myalgias.  Skin: Negative for color change, pallor, rash and wound.  Allergic/Immunologic: Negative for environmental allergies and food allergies.  Neurological: Negative for dizziness, speech difficulty, weakness, numbness and headaches.  Hematological: Negative for adenopathy. Does not bruise/bleed easily.  Psychiatric/Behavioral: Negative for hallucinations, confusion and agitation.       Objective:   Physical Exam  Constitutional: He is oriented to person, place, and time. He appears well-developed and well-nourished. No distress.  HENT:  Head: Normocephalic.  Mouth/Throat: Oropharynx is clear and moist. No oropharyngeal exudate.  Eyes: Conjunctivae and EOM are normal. Pupils are equal, round, and reactive to light. No scleral icterus.  Neck: Normal range of motion. Neck supple. No tracheal deviation present.  Cardiovascular: Normal rate, regular rhythm and intact distal pulses.   Pulmonary/Chest: Effort normal and breath sounds normal. No respiratory distress.  Abdominal: Soft. He  exhibits no distension. There is tenderness. There is no rigidity, no guarding, no CVA tenderness, no tenderness at McBurney's point and negative Murphy's sign. A hernia is present. Hernia confirmed positive in the ventral area. Hernia confirmed negative in the right inguinal area and confirmed negative in the left inguinal area.    Musculoskeletal: Normal range of motion. He exhibits no tenderness.  Lymphadenopathy:    He has no cervical adenopathy.       Right: No inguinal adenopathy present.       Left: No inguinal adenopathy present.  Neurological: He is alert and oriented to person, place, and time. No cranial nerve deficit. He exhibits normal muscle tone. Coordination normal.  Skin: Skin is warm and dry. No rash noted. He is not diaphoretic. No erythema. No pallor.  Psychiatric: He has a normal mood and affect. His behavior is normal. Judgment and thought content normal.       Assessment:     Incarcerated umbilical hernia.     Plan:     Given the fact has gotten slightly larger and is bothersome with his workouts, I offered surgical repair.  He wishes to be aggressive and repair this.  Given its size and some of his abdominal obesity, I would recommend mesh repair.  I would  do a laparoscopic approach.  He agrees:  The anatomy & physiology of the abdominal wall and pelvic floor was discussed.  The pathophysiology of hernias in the inguinal and pelvic region was discussed.  Natural history risks such as progressive enlargement, pain, incarceration & strangulation was discussed.   Contributors to complications such as smoking, obesity, diabetes, prior surgery, etc were discussed.    I feel the risks of no intervention will lead to serious problems that outweigh the operative risks; therefore, I recommended surgery to reduce and repair the hernia.  I explained laparoscopic techniques with possible need for an open approach.  I noted usual use of mesh to patch and/or buttress hernia  repair  Risks such as bleeding, infection, abscess, need for further treatment, heart attack, death, and other risks were discussed.  I noted a good likelihood this will help address the problem.   Goals of post-operative recovery were discussed as well.  Possibility that this will not correct all symptoms was explained.  I stressed the importance of low-impact activity, aggressive pain control, avoiding constipation, & not pushing through pain to minimize risk of post-operative chronic pain or injury. Possibility of reherniation was discussed.  We will work to minimize complications.     An educational handout further explaining the pathology & treatment options was given as well.  Questions were answered.  The patient expresses understanding & wishes to proceed with surgery.  I am concerned about the health of the patient and the ability to tolerate the operation.  Therefore, we will request clearance by cardiology to better assess operative risk & see if a reevaluation, further workup, adjustment to medications, etc is needed.  He has good exercise tolerance and stable cardiac disease, so I suspect it is just a formality.  He does have a defibrillator, so I would like to make sure that is managed optimally perioperatively.  I do not feel strongly that he needs to stop his aspirin as long as he understands he will have some increased bruising.

## 2013-04-07 ENCOUNTER — Ambulatory Visit (INDEPENDENT_AMBULATORY_CARE_PROVIDER_SITE_OTHER): Payer: Medicare Other | Admitting: Cardiology

## 2013-04-07 ENCOUNTER — Encounter: Payer: Self-pay | Admitting: Cardiology

## 2013-04-07 VITALS — BP 192/98 | HR 82 | Ht 75.0 in | Wt 190.0 lb

## 2013-04-07 DIAGNOSIS — I714 Abdominal aortic aneurysm, without rupture, unspecified: Secondary | ICD-10-CM

## 2013-04-07 DIAGNOSIS — I442 Atrioventricular block, complete: Secondary | ICD-10-CM

## 2013-04-07 DIAGNOSIS — Z01810 Encounter for preprocedural cardiovascular examination: Secondary | ICD-10-CM

## 2013-04-07 DIAGNOSIS — I2510 Atherosclerotic heart disease of native coronary artery without angina pectoris: Secondary | ICD-10-CM

## 2013-04-07 DIAGNOSIS — I10 Essential (primary) hypertension: Secondary | ICD-10-CM | POA: Diagnosis not present

## 2013-04-07 DIAGNOSIS — Z95810 Presence of automatic (implantable) cardiac defibrillator: Secondary | ICD-10-CM

## 2013-04-07 DIAGNOSIS — Z79899 Other long term (current) drug therapy: Secondary | ICD-10-CM

## 2013-04-07 NOTE — Assessment & Plan Note (Signed)
The patient has a CRT-D pacing device. This was placed for heart block and left ventricular dysfunction.

## 2013-04-07 NOTE — Assessment & Plan Note (Signed)
He had an endovascular repair of his aortic aneurysm in the past. It is stable and followed by vascular surgery.

## 2013-04-07 NOTE — Assessment & Plan Note (Signed)
The patient can be cleared for his umbilical hernia repair. He's had no recent ischemia or arrhythmias. He has no clinical CHF. His exercise level is high. He is cleared for his surgery. His ICD will have to be turned off by the representative at the time of the procedure.

## 2013-04-07 NOTE — Assessment & Plan Note (Signed)
Pacemaker was placed in March, 2014 for complete heart block.

## 2013-04-07 NOTE — Assessment & Plan Note (Signed)
Coronary disease is stable. His last cath was June, 2013. He has moderate nonobstructive disease. No intervention was needed.

## 2013-04-07 NOTE — Assessment & Plan Note (Signed)
There is significant whitecoat hypertension. His pressure is normal at home when checked on a repeated basis. His equipment has been checked.

## 2013-04-07 NOTE — Patient Instructions (Signed)
**Note De-identified Luke [name] Obfuscation** Your physician recommends that you continue on your current medications as directed. Please refer to the Current Medication list given to you today.  Your physician wants you to follow-up in: 6 months. You will receive a reminder letter in the mail two months in advance. If you don't receive a letter, please call our office to schedule the follow-up appointment.  

## 2013-04-07 NOTE — Progress Notes (Signed)
HPI  Patient is seen today to followup his coronary disease and cardiomyopathy. He's also seen for preop clearance for ventral hernia repair. This gentleman remains very active. He's had significant medical problems but he continues to exercise well and he is quite stable. He's had an abdominal aortic aneurysm repair in the past. He has an ICD in place for left ventricular dysfunction. He continues to exercise at a high level. He is not having any significant symptoms. He has a history of significant white coat syndrome. His pressure is always elevated here. He takes it at home and it is always in the normal range. This has been checked very carefully over time.  Allergies  Allergen Reactions  . Codeine Hives    Current Outpatient Prescriptions  Medication Sig Dispense Refill  . aspirin 325 MG tablet Take 325 mg by mouth daily.      . carvedilol (COREG) 12.5 MG tablet Take 1 tablet (12.5 mg total) by mouth 2 (two) times daily with a meal.  60 tablet  6  . glipiZIDE (GLUCOTROL) 5 MG tablet take 1 tablet by mouth twice a day BEFORE A MEAL  180 tablet  3  . lisinopril (PRINIVIL,ZESTRIL) 20 MG tablet Take 1 tablet (20 mg total) by mouth 2 (two) times daily.  180 tablet  3  . metFORMIN (GLUCOPHAGE) 500 MG tablet Take 2 tablets (1,000 mg total) by mouth 2 (two) times daily with a meal. HOLD for 2 days, then resume usual dose on 07/19/2012  360 tablet  3  . Multiple Vitamin (MULTIVITAMIN) tablet Take 1 tablet by mouth daily.        . simvastatin (ZOCOR) 40 MG tablet Take 40 mg by mouth at bedtime.      Marland Kitchen VIAGRA 50 MG tablet TAKE ONE TABLET BY MOUTH ONCE DAILY AS NEEDED  10 tablet  12   No current facility-administered medications for this visit.    History   Social History  . Marital Status: Married    Spouse Name: N/A    Number of Children: N/A  . Years of Education: N/A   Occupational History  . Not on file.   Social History Main Topics  . Smoking status: Former Smoker    Types:  Cigarettes    Quit date: 05/06/2004  . Smokeless tobacco: Never Used     Comment: quit about 7 years ago  . Alcohol Use: 1.2 oz/week    2 Cans of beer per week     Comment: beer or wine  occassionally  . Drug Use: No  . Sexual Activity: Not on file   Other Topics Concern  . 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 Hypertension Mother   . Stroke Mother   . Diabetes Sister   . Heart disease Sister   . Hypertension Sister   . Colon cancer Neg Hx   . Prostate cancer Neg Hx   . Heart disease Father     Past Medical History  Diagnosis Date  . Diabetes mellitus     type II  . PAD (peripheral artery disease)   . AAA (abdominal aortic aneurysm) 2011    Per vascular surgery  . HLD (hyperlipidemia)   . Hypertension     white coat HTN-- often elevated in office and controlled on outside checks.  . Low testosterone     Hx of  .  Cardiomyopathy     Nuclear, October 09, 2011, EF 30%, multiple focal wall motion abnormalities  . Ejection fraction < 50%     EF 30%, nuclear, October 09, 2011  . LBBB (left bundle branch block)     LBBB on EKG October 11, 2011,  no prior EKG has been done  . CAD (coronary artery disease)     Presumed CAD with nuclear scan October 09, 2011,  large anteroseptal MI and inferior MI. Catheterization scheduled October 15, 2011  . Anxiety     no med. in use for anxiety but pt. speaks openly of his stress & anxious feelings regarding impending surgery    . ICD (implantable cardioverter-defibrillator) in place     CRT-D placed March, 2014 complete heart block and k dysfunction  . Ejection fraction < 50%     EF previously 30%  //   EF 30-35%, echo, July 14, 2012, severe diffuse hypokinesis, PA pressure 43 mm mercury  . Pacemaker   . Myocardial infarction   . Drug therapy     Hyperkalemia with spironolactone    Past Surgical History  Procedure Laterality Date  . Tonsillectomy      as a child   .  Colonoscopy    . Cardiac catheterization    . Abdominal aortic aneurysm repair      EVAR   . Pacemaker insertion  07-16-12    Patient Active Problem List   Diagnosis Date Noted  . Preop cardiovascular exam 04/07/2013  . Drug therapy   . ICD (implantable cardioverter-defibrillator) in place   . Umbilical hernia, incarcerated 4cm 03/19/2013  . Aftercare following surgery of the circulatory system, NEC 12/30/2012  . Abdominal aneurysm without mention of rupture 12/30/2012  . HLD (hyperlipidemia) 09/16/2012  . Ejection fraction < 50%   . Complete heart block 07/14/2012  . SK (seborrheic keratosis) 02/12/2012  . Medicare annual wellness visit, initial 11/08/2011  . Shoulder pain 11/08/2011  . CAD (coronary artery disease)   . Ischemic cardiomyopathy   . LBBB (left bundle branch block)   . AAA (abdominal aortic aneurysm) 10/08/2010  . PVD (peripheral vascular disease) 10/08/2010  . ORGANIC IMPOTENCE 07/05/2010  . ESSENTIAL HYPERTENSION, BENIGN 02/27/2010  . Type 2 diabetes mellitus with vascular disease 11/16/2009    ROS  Patient denies fever, chills, headache, sweats, rash, change in vision, change in hearing, chest pain, cough, nausea vomiting, urinary symptoms. All other systems are reviewed and are negative.  PHYSICAL EXAM  Patient is oriented to person time and place. Affect is normal. There is no jugulovenous distention. Lungs are clear. Respiratory effort is not labored. Cardiac exam reveals S1 and S2. There no clicks or significant murmurs. Abdomen is soft. I did not completely evaluate his hernia. There is no peripheral edema.  Filed Vitals:   04/07/13 1351  BP: 192/98  Pulse: 82  Height: 6\' 3"  (1.905 m)  Weight: 190 lb (86.183 kg)   EKG from the past has shown left bundle branch block.  ASSESSMENT & PLAN

## 2013-04-07 NOTE — Assessment & Plan Note (Addendum)
Patient is on appropriate medications for his cardiomyopathy. I tried spironolactone and he had hyperkalemia. Spironolactone was stopped.

## 2013-04-08 ENCOUNTER — Telehealth (INDEPENDENT_AMBULATORY_CARE_PROVIDER_SITE_OTHER): Payer: Self-pay

## 2013-04-08 NOTE — Telephone Encounter (Signed)
Called pt to notify him that we received cardiac clearance from Dr Myrtis Ser and orders taken to surgery scheduling. The pt wants surgery in January 2015.

## 2013-04-20 ENCOUNTER — Encounter: Payer: Self-pay | Admitting: Internal Medicine

## 2013-04-20 ENCOUNTER — Ambulatory Visit (INDEPENDENT_AMBULATORY_CARE_PROVIDER_SITE_OTHER): Payer: Medicare Other | Admitting: Internal Medicine

## 2013-04-20 VITALS — BP 192/95 | HR 69 | Ht 75.0 in | Wt 189.8 lb

## 2013-04-20 DIAGNOSIS — I10 Essential (primary) hypertension: Secondary | ICD-10-CM

## 2013-04-20 DIAGNOSIS — I2589 Other forms of chronic ischemic heart disease: Secondary | ICD-10-CM

## 2013-04-20 DIAGNOSIS — I442 Atrioventricular block, complete: Secondary | ICD-10-CM | POA: Diagnosis not present

## 2013-04-20 DIAGNOSIS — Z4502 Encounter for adjustment and management of automatic implantable cardiac defibrillator: Secondary | ICD-10-CM | POA: Insufficient documentation

## 2013-04-20 DIAGNOSIS — Z95810 Presence of automatic (implantable) cardiac defibrillator: Secondary | ICD-10-CM

## 2013-04-20 DIAGNOSIS — I255 Ischemic cardiomyopathy: Secondary | ICD-10-CM

## 2013-04-20 LAB — MDC_IDC_ENUM_SESS_TYPE_INCLINIC
Battery Remaining Longevity: 72 mo
Brady Statistic RA Percent Paced: 19 %
Brady Statistic RV Percent Paced: 99.62 %
Date Time Interrogation Session: 20141216170903
HighPow Impedance: 81 Ohm
Implantable Pulse Generator Serial Number: 7097007
Lead Channel Impedance Value: 1162.5 Ohm
Lead Channel Impedance Value: 475 Ohm
Lead Channel Impedance Value: 475 Ohm
Lead Channel Pacing Threshold Amplitude: 0.5 V
Lead Channel Pacing Threshold Amplitude: 0.5 V
Lead Channel Pacing Threshold Amplitude: 0.75 V
Lead Channel Pacing Threshold Amplitude: 0.75 V
Lead Channel Pacing Threshold Amplitude: 1.25 V
Lead Channel Pacing Threshold Amplitude: 1.25 V
Lead Channel Pacing Threshold Pulse Width: 0.5 ms
Lead Channel Pacing Threshold Pulse Width: 0.5 ms
Lead Channel Pacing Threshold Pulse Width: 0.5 ms
Lead Channel Pacing Threshold Pulse Width: 0.5 ms
Lead Channel Pacing Threshold Pulse Width: 1 ms
Lead Channel Pacing Threshold Pulse Width: 1 ms
Lead Channel Sensing Intrinsic Amplitude: 12 mV
Lead Channel Sensing Intrinsic Amplitude: 4.4 mV
Lead Channel Setting Pacing Amplitude: 2 V
Lead Channel Setting Pacing Amplitude: 2.5 V
Lead Channel Setting Pacing Amplitude: 2.5 V
Lead Channel Setting Pacing Pulse Width: 0.5 ms
Lead Channel Setting Pacing Pulse Width: 1 ms
Lead Channel Setting Sensing Sensitivity: 0.5 mV
Zone Setting Detection Interval: 250 ms
Zone Setting Detection Interval: 300 ms

## 2013-04-20 MED ORDER — ASPIRIN 81 MG PO TABS: 81.0000 mg | ORAL_TABLET | Freq: Every day | ORAL | Status: DC

## 2013-04-20 MED ORDER — CARVEDILOL 25 MG PO TABS: 25.0000 mg | ORAL_TABLET | Freq: Two times a day (BID) | ORAL | Status: DC

## 2013-04-20 NOTE — Patient Instructions (Addendum)
Your physician has recommended you make the following change in your medications:  1) Decrease Aspirin to 81 mg daily 2) Increase Carvedilol to 25 mg twice daily  Remote monitoring is used to monitor your Pacemaker of ICD from home. This monitoring reduces the number of office visits required to check your device to one time per year. It allows [place] to keep an eye on the functioning of your device to ensure it is working properly. You are scheduled for a device check from home on 07/22/2013. You may send your transmission at any time that day. If you have a wireless device, the transmission will be sent automatically. After your physician reviews your transmission, you will receive a postcard with your next transmission date.  Your physician wants you to follow-up in: 9 months with Dr. Graciela Husbands.  You will receive a reminder letter in the mail two months in advance. If you don't receive a letter, please call our office to schedule the follow-up appointment.

## 2013-04-20 NOTE — Assessment & Plan Note (Signed)
The patient's device was interrogated and the information was fully reviewed.  The device was reprogrammed to maximize longevity  

## 2013-04-20 NOTE — Assessment & Plan Note (Signed)
Stable post pacemaker 

## 2013-04-20 NOTE — Assessment & Plan Note (Signed)
The patient's blood pressure is markedly elevated today. He explains this has whitecoat hypertension. Still, we will increase his carvedilol to 12.5--25 mg. He is not a candidate for Aldactone because of hyperkalemia.  We'll also decrease his aspirin from 325--81 based on recent guidelines

## 2013-04-20 NOTE — Assessment & Plan Note (Signed)
As above.

## 2013-04-20 NOTE — Progress Notes (Signed)
Patient Care Team: [name], MD as PCP - General [name], MD as Attending Physician (Vascular Surgery) Luis Abed, MD (Cardiology) Willis Modena, MD as Consulting Physician (Gastroenterology) Ardeth Sportsman, MD as Consulting Physician (General Surgery)   HPI  Luke [name] is a 67 y.o. male Seen in followup for an CRT-  ICD implanted for intermittent complete heart block in the setting of ischemic cardio myopathy and left bundle branch block.     LV Ejection Fraction: 30%. He underwent catheterization which confirmed severe LV dysfunction with moderate to severe 2 vessel disease not thought to be appropriate for intervention.  He's been on carvedilol for the last 6 or 8 months. He did have one of these episodes prior to carvedilol initiation    He is much improved following CRT-D implantation.  He has been intolerant of Aldactone in the past because of hyperkalemia  Past Medical History  Diagnosis Date  . Diabetes mellitus     type II  . PAD (peripheral artery disease)   . AAA (abdominal aortic aneurysm) 2011    Per vascular surgery  . HLD (hyperlipidemia)   . Hypertension     white coat HTN-- often elevated in office and controlled on outside checks.  . Low testosterone     Hx of  . Cardiomyopathy     Nuclear, October 09, 2011, EF 30%, multiple focal wall motion abnormalities  . Ejection fraction < 50%     EF 30%, nuclear, October 09, 2011  . LBBB (left bundle branch block)     LBBB on EKG October 11, 2011,  no prior EKG has been done  . CAD (coronary artery disease)     Presumed CAD with nuclear scan October 09, 2011,  large anteroseptal MI and inferior MI. Catheterization scheduled October 15, 2011  . Anxiety     no med. in use for anxiety but pt. speaks openly of his stress & anxious feelings regarding impending surgery    . ICD (implantable cardioverter-defibrillator) in place     CRT-D placed March, 2014 complete heart block and k dysfunction    . Ejection fraction < 50%     EF previously 30%  //   EF 30-35%, echo, July 14, 2012, severe diffuse hypokinesis, PA pressure 43 mm mercury  . Pacemaker   . Myocardial infarction   . Drug therapy     Hyperkalemia with spironolactone    Past Surgical History  Procedure Laterality Date  . Tonsillectomy      as a child   . Colonoscopy    . Cardiac catheterization    . Abdominal aortic aneurysm repair      EVAR   . Pacemaker insertion  07-16-12    Current Outpatient Prescriptions  Medication Sig Dispense Refill  . aspirin 325 MG tablet Take 325 mg by mouth daily.      . carvedilol (COREG) 12.5 MG tablet Take 1 tablet (12.5 mg total) by mouth 2 (two) times daily with a meal.  60 tablet  6  . glipiZIDE (GLUCOTROL) 5 MG tablet take 1 tablet by mouth twice a day BEFORE A MEAL  180 tablet  3  . lisinopril (PRINIVIL,ZESTRIL) 20 MG tablet Take 1 tablet (20 mg total) by mouth 2 (two) times daily.  180 tablet  3  . metFORMIN (GLUCOPHAGE) 500 MG tablet Take 2 tablets (1,000 mg total) by mouth 2 (two) times daily with a meal. HOLD for 2 days, then  resume usual dose on 07/19/2012  360 tablet  3  . Multiple Vitamin (MULTIVITAMIN) tablet Take 1 tablet by mouth daily.        . simvastatin (ZOCOR) 40 MG tablet Take 40 mg by mouth at bedtime.      Marland Kitchen VIAGRA 50 MG tablet TAKE ONE TABLET BY MOUTH ONCE DAILY AS NEEDED  10 tablet  12   No current facility-administered medications for this visit.    Allergies  Allergen Reactions  . Codeine Hives    Review of Systems negative except from HPI and PMH  Physical Exam BP 192/95  Pulse 69  Ht 6\' 3"  (1.905 m)  Wt 189 lb 12.8 oz (86.093 kg)  BMI 23.72 kg/m2 Well developed and nourished in no acute distress HENT normal Neck supple with JVP-flat Clear Device pocket well healed; without hematoma or erythema.  There is no tethering there is atrophy Regular rate and rhythm, no murmurs or gallops Abd-soft with active BS No Clubbing cyanosis  edema Skin-warm and dry A & Oriented  Grossly normal sensory and motor function   ECG demonstrates history this is a narrow QRS and a leg rest revision Assessment and  Plan

## 2013-05-04 ENCOUNTER — Encounter (HOSPITAL_COMMUNITY): Payer: Self-pay | Admitting: Pharmacy Technician

## 2013-05-04 ENCOUNTER — Telehealth: Payer: Self-pay | Admitting: Internal Medicine

## 2013-05-04 NOTE — Telephone Encounter (Signed)
New Message  Pt called states that he was informed that his monitor wasn't producing any signals.. He says that he has sent a signal and requests a call back to confirm that the signal was completed and to discuss how to move forward.Marland Kitchen

## 2013-05-04 NOTE — Telephone Encounter (Signed)
Routing to device clinic 

## 2013-05-04 NOTE — Telephone Encounter (Signed)
LMOM stating we received their transmission on 04/20/13

## 2013-05-10 ENCOUNTER — Encounter (HOSPITAL_COMMUNITY): Payer: Self-pay

## 2013-05-10 ENCOUNTER — Other Ambulatory Visit (INDEPENDENT_AMBULATORY_CARE_PROVIDER_SITE_OTHER): Payer: Self-pay | Admitting: Surgery

## 2013-05-10 ENCOUNTER — Encounter (HOSPITAL_COMMUNITY)
Admission: RE | Admit: 2013-05-10 | Discharge: 2013-05-10 | Disposition: A | Discharge status: Home / Self Care | Payer: Medicare Other | Source: Ambulatory Visit | Attending: Surgery | Admitting: Surgery

## 2013-05-10 DIAGNOSIS — Z01818 Encounter for other preprocedural examination: Secondary | ICD-10-CM | POA: Diagnosis not present

## 2013-05-10 DIAGNOSIS — Z01812 Encounter for preprocedural laboratory examination: Secondary | ICD-10-CM | POA: Insufficient documentation

## 2013-05-10 LAB — BASIC METABOLIC PANEL
BUN: 35 mg/dL — ABNORMAL HIGH (ref 6–23)
CO2: 22 mEq/L (ref 19–32)
Calcium: 9.9 mg/dL (ref 8.4–10.5)
Chloride: 100 mEq/L (ref 96–112)
Creatinine, Ser: 1.33 mg/dL (ref 0.50–1.35)
GFR calc Af Amer: 63 mL/min — ABNORMAL LOW (ref >90)
GFR calc non Af Amer: 54 mL/min — ABNORMAL LOW (ref >90)
Glucose, Bld: 143 mg/dL — ABNORMAL HIGH (ref 70–99)
Potassium: 5.4 mEq/L — ABNORMAL HIGH (ref 3.7–5.3)
Sodium: 136 mEq/L — ABNORMAL LOW (ref 137–147)

## 2013-05-10 LAB — CBC
HCT: 40.5 % (ref 39.0–52.0)
Hemoglobin: 14.2 g/dL (ref 13.0–17.0)
MCH: 33.4 pg (ref 26.0–34.0)
MCHC: 35.1 g/dL (ref 30.0–36.0)
MCV: 95.3 fL (ref 78.0–100.0)
Platelets: 208 10*3/uL (ref 150–400)
RBC: 4.25 MIL/uL (ref 4.22–5.81)
RDW: 12.7 % (ref 11.5–15.5)
WBC: 11.8 10*3/uL — ABNORMAL HIGH (ref 4.0–10.5)

## 2013-05-10 MED ORDER — CHLORHEXIDINE GLUCONATE 4 % EX LIQD: 1.0000 "application " | Freq: Once | CUTANEOUS | Status: DC

## 2013-05-10 NOTE — Pre-Procedure Instructions (Signed)
Luke [name]  05/10/2013   Your procedure is scheduled on:  Friday, Janunary 9.  Report to [facility], Main Entrance or Entrance "A" at 5:30 AM.  Call this number if you have problems the morning of surgery: [phone]   Remember:   Do not eat food or drink liquids after midnight, Thursday.   Take these medicines the morning of surgery with A SIP OF WATER: carvedilol (COREG).    Do not wear jewelry, make-up or nail polish.  Do not wear lotions, powders, or perfumes. You may wear deodorant             Men may shave face and neck.  Do not bring valuables to the hospital.  [facility] is not responsible for any belongings or valuables.                Contacts, dentures or bridgework may not be worn into surgery.  Leave suitcase in the car. After surgery it may be brought to your room.  For patients admitted to the hospital, discharge time is determined by your   treatment team.               Patients discharged the day of surgery will not be allowed to drive home.  Name and phone number of your driver: -  Special Instructions: Shower using CHG 2 nights before surgery and the night before surgery.  If you shower the day of surgery use CHG.  Use special wash - you have one bottle of CHG for all showers.  You should use approximately 1/3 of the bottle for each shower.   Please read over the following fact sheets that you were given: Pain Booklet, Coughing and Deep Breathing and Surgical Site Infection Prevention

## 2013-05-13 ENCOUNTER — Other Ambulatory Visit: Payer: Self-pay | Admitting: Family Medicine

## 2013-05-13 MED ORDER — CEFAZOLIN SODIUM-DEXTROSE 2-3 GM-% IV SOLR
2.0000 g | INTRAVENOUS | Status: AC
  Administered 2013-05-14: 2 g via INTRAVENOUS
  Filled 2013-05-13: qty 50

## 2013-05-14 ENCOUNTER — Encounter (HOSPITAL_COMMUNITY)
Admission: RE | Disposition: A | Discharge status: Home / Self Care | Payer: Self-pay | Source: Ambulatory Visit | Attending: Surgery

## 2013-05-14 ENCOUNTER — Encounter (HOSPITAL_COMMUNITY): Payer: Self-pay | Admitting: *Deleted

## 2013-05-14 ENCOUNTER — Encounter (HOSPITAL_COMMUNITY): Payer: Medicare Other | Admitting: Certified Registered Nurse Anesthetist

## 2013-05-14 ENCOUNTER — Ambulatory Visit (HOSPITAL_COMMUNITY)
Admission: RE | Admit: 2013-05-14 | Discharge: 2013-05-14 | Disposition: A | Discharge status: Home / Self Care | Payer: Medicare Other | Source: Ambulatory Visit | Attending: Surgery | Admitting: Surgery

## 2013-05-14 ENCOUNTER — Ambulatory Visit (HOSPITAL_COMMUNITY): Payer: Medicare Other | Admitting: Certified Registered Nurse Anesthetist

## 2013-05-14 DIAGNOSIS — I2510 Atherosclerotic heart disease of native coronary artery without angina pectoris: Secondary | ICD-10-CM | POA: Insufficient documentation

## 2013-05-14 DIAGNOSIS — I10 Essential (primary) hypertension: Secondary | ICD-10-CM | POA: Insufficient documentation

## 2013-05-14 DIAGNOSIS — I252 Old myocardial infarction: Secondary | ICD-10-CM | POA: Insufficient documentation

## 2013-05-14 DIAGNOSIS — E119 Type 2 diabetes mellitus without complications: Secondary | ICD-10-CM | POA: Diagnosis not present

## 2013-05-14 DIAGNOSIS — K420 Umbilical hernia with obstruction, without gangrene: Secondary | ICD-10-CM

## 2013-05-14 DIAGNOSIS — Z95810 Presence of automatic (implantable) cardiac defibrillator: Secondary | ICD-10-CM | POA: Diagnosis not present

## 2013-05-14 DIAGNOSIS — I447 Left bundle-branch block, unspecified: Secondary | ICD-10-CM | POA: Insufficient documentation

## 2013-05-14 DIAGNOSIS — Z87891 Personal history of nicotine dependence: Secondary | ICD-10-CM | POA: Insufficient documentation

## 2013-05-14 DIAGNOSIS — K429 Umbilical hernia without obstruction or gangrene: Secondary | ICD-10-CM | POA: Diagnosis not present

## 2013-05-14 DIAGNOSIS — I739 Peripheral vascular disease, unspecified: Secondary | ICD-10-CM | POA: Diagnosis not present

## 2013-05-14 HISTORY — DX: Presence of automatic (implantable) cardiac defibrillator: Z95.810

## 2013-05-14 HISTORY — PX: HERNIA REPAIR: SHX51

## 2013-05-14 HISTORY — PX: UMBILICAL HERNIA REPAIR: SHX196

## 2013-05-14 HISTORY — PX: INSERTION OF MESH: SHX5868

## 2013-05-14 LAB — GLUCOSE, CAPILLARY
Glucose-Capillary: 203 mg/dL — ABNORMAL HIGH (ref 70–99)
Glucose-Capillary: 284 mg/dL — ABNORMAL HIGH (ref 70–99)

## 2013-05-14 SURGERY — REPAIR, HERNIA, UMBILICAL, LAPAROSCOPIC
Anesthesia: General | Site: Abdomen

## 2013-05-14 MED ORDER — PHENYLEPHRINE HCL 10 MG/ML IJ SOLN
10.0000 mg | INTRAVENOUS | Status: DC | PRN
  Administered 2013-05-14: 20 ug/min via INTRAVENOUS

## 2013-05-14 MED ORDER — METOCLOPRAMIDE HCL 5 MG/ML IJ SOLN
10.0000 mg | Freq: Once | INTRAMUSCULAR | Status: AC | PRN
Start: 2013-05-14 — End: 2013-05-14
  Administered 2013-05-14: 10 mg via INTRAVENOUS

## 2013-05-14 MED ORDER — PHENYLEPHRINE HCL 10 MG/ML IJ SOLN
INTRAMUSCULAR | Status: DC | PRN
  Administered 2013-05-14: 80 ug via INTRAVENOUS
  Administered 2013-05-14: 40 ug via INTRAVENOUS
  Administered 2013-05-14: 80 ug via INTRAVENOUS

## 2013-05-14 MED ORDER — ONDANSETRON HCL 4 MG/2ML IJ SOLN
INTRAMUSCULAR | Status: DC | PRN
  Administered 2013-05-14: 4 mg via INTRAVENOUS

## 2013-05-14 MED ORDER — BUPIVACAINE-EPINEPHRINE (PF) 0.25% -1:200000 IJ SOLN
INTRAMUSCULAR | Status: AC
  Filled 2013-05-14: qty 60

## 2013-05-14 MED ORDER — METOCLOPRAMIDE HCL 5 MG/ML IJ SOLN
INTRAMUSCULAR | Status: AC
  Filled 2013-05-14: qty 2

## 2013-05-14 MED ORDER — GLYCOPYRROLATE 0.2 MG/ML IJ SOLN
INTRAMUSCULAR | Status: DC | PRN
  Administered 2013-05-14: .8 mg via INTRAVENOUS

## 2013-05-14 MED ORDER — BUPIVACAINE-EPINEPHRINE (PF) 0.25% -1:200000 IJ SOLN
INTRAMUSCULAR | Status: AC
  Filled 2013-05-14: qty 30

## 2013-05-14 MED ORDER — FENTANYL CITRATE 0.05 MG/ML IJ SOLN
INTRAMUSCULAR | Status: DC | PRN
Start: 2013-05-14 — End: 2013-05-14
  Administered 2013-05-14 (×2): 100 ug via INTRAVENOUS

## 2013-05-14 MED ORDER — ARTIFICIAL TEARS OP OINT
TOPICAL_OINTMENT | OPHTHALMIC | Status: DC | PRN
  Administered 2013-05-14: 1 via OPHTHALMIC

## 2013-05-14 MED ORDER — FENTANYL CITRATE 0.05 MG/ML IJ SOLN: 25.0000 ug | INTRAMUSCULAR | Status: DC | PRN

## 2013-05-14 MED ORDER — OXYCODONE HCL 5 MG PO TABS: 5.0000 mg | ORAL_TABLET | ORAL | Status: DC | PRN

## 2013-05-14 MED ORDER — ETOMIDATE 2 MG/ML IV SOLN
INTRAVENOUS | Status: DC | PRN
  Administered 2013-05-14: 14 mg via INTRAVENOUS

## 2013-05-14 MED ORDER — NEOSTIGMINE METHYLSULFATE 1 MG/ML IJ SOLN
INTRAMUSCULAR | Status: DC | PRN
  Administered 2013-05-14: 5 mg via INTRAVENOUS

## 2013-05-14 MED ORDER — BUPIVACAINE-EPINEPHRINE 0.25% -1:200000 IJ SOLN
INTRAMUSCULAR | Status: DC | PRN
  Administered 2013-05-14: 30 mL

## 2013-05-14 MED ORDER — SODIUM CHLORIDE 0.9 % IV SOLN
INTRAVENOUS | Status: DC | PRN
  Administered 2013-05-14 (×2): via INTRAVENOUS

## 2013-05-14 MED ORDER — LIDOCAINE HCL (CARDIAC) 20 MG/ML IV SOLN
INTRAVENOUS | Status: DC | PRN
  Administered 2013-05-14: 60 mg via INTRAVENOUS

## 2013-05-14 MED ORDER — ROCURONIUM BROMIDE 100 MG/10ML IV SOLN
INTRAVENOUS | Status: DC | PRN
  Administered 2013-05-14 (×2): 10 mg via INTRAVENOUS
  Administered 2013-05-14: 40 mg via INTRAVENOUS

## 2013-05-14 MED ORDER — ACETAMINOPHEN 10 MG/ML IV SOLN
INTRAVENOUS | Status: AC
  Filled 2013-05-14: qty 100

## 2013-05-14 MED ORDER — ACETAMINOPHEN 10 MG/ML IV SOLN
1000.0000 mg | Freq: Four times a day (QID) | INTRAVENOUS | Status: DC
  Administered 2013-05-14: 1000 mg via INTRAVENOUS

## 2013-05-14 MED ORDER — 0.9 % SODIUM CHLORIDE (POUR BTL) OPTIME
TOPICAL | Status: DC | PRN
  Administered 2013-05-14 (×2): 1000 mL

## 2013-05-14 SURGICAL SUPPLY — 53 items
APPLIER CLIP LOGIC TI 5 (MISCELLANEOUS) IMPLANT
APR CLP MED LRG 33X5 (MISCELLANEOUS)
BINDER ABD UNIV 12 45-62 (WOUND CARE) ×1 IMPLANT
BINDER ABDOMINAL 46IN 62IN (WOUND CARE) ×3
BLADE SURG ROTATE 9660 (MISCELLANEOUS) ×4 IMPLANT
CANISTER SUCTION 2500CC (MISCELLANEOUS) IMPLANT
CHLORAPREP W/TINT 26ML (MISCELLANEOUS) ×3 IMPLANT
CLOSURE WOUND 1/2 X4 (GAUZE/BANDAGES/DRESSINGS) ×1
COVER SURGICAL LIGHT HANDLE (MISCELLANEOUS) ×3 IMPLANT
DEVICE SECURE STRAP 25 ABSORB (INSTRUMENTS) ×3 IMPLANT
DEVICE TROCAR PUNCTURE CLOSURE (ENDOMECHANICALS) ×3 IMPLANT
DRAPE WARM FLUID 44X44 (DRAPE) ×3 IMPLANT
DRSG TEGADERM 4X4.75 (GAUZE/BANDAGES/DRESSINGS) ×5 IMPLANT
ELECT REM PT RETURN 9FT ADLT (ELECTROSURGICAL) ×3
ELECTRODE REM PT RTRN 9FT ADLT (ELECTROSURGICAL) ×1 IMPLANT
GAUZE SPONGE 2X2 8PLY STRL LF (GAUZE/BANDAGES/DRESSINGS) ×1 IMPLANT
GLOVE BIO SURGEON STRL SZ 6 (GLOVE) ×4 IMPLANT
GLOVE BIO SURGEON STRL SZ7 (GLOVE) ×2 IMPLANT
GLOVE BIOGEL PI IND STRL 7.0 (GLOVE) IMPLANT
GLOVE BIOGEL PI IND STRL 7.5 (GLOVE) IMPLANT
GLOVE BIOGEL PI IND STRL 8 (GLOVE) ×1 IMPLANT
GLOVE BIOGEL PI INDICATOR 7.0 (GLOVE) ×4
GLOVE BIOGEL PI INDICATOR 7.5 (GLOVE) ×2
GLOVE BIOGEL PI INDICATOR 8 (GLOVE) ×2
GLOVE ECLIPSE 8.0 STRL XLNG CF (GLOVE) ×3 IMPLANT
GLOVE SURG SS PI 6.5 STRL IVOR (GLOVE) ×2 IMPLANT
GLOVE SURG SS PI 7.0 STRL IVOR (GLOVE) ×2 IMPLANT
GOWN STRL NON-REIN LRG LVL3 (GOWN DISPOSABLE) ×8 IMPLANT
GOWN STRL REIN XL XLG (GOWN DISPOSABLE) ×3 IMPLANT
KIT BASIN OR (CUSTOM PROCEDURE TRAY) ×3 IMPLANT
KIT ROOM TURNOVER OR (KITS) ×3 IMPLANT
MARKER SKIN DUAL TIP RULER LAB (MISCELLANEOUS) ×3 IMPLANT
MESH VENTRALIGHT ST 4X6IN (Mesh General) ×2 IMPLANT
NDL SPNL 22GX3.5 QUINCKE BK (NEEDLE) ×1 IMPLANT
NEEDLE 22X1 1/2 (OR ONLY) (NEEDLE) ×3 IMPLANT
NEEDLE SPNL 22GX3.5 QUINCKE BK (NEEDLE) ×3 IMPLANT
NS IRRIG 1000ML POUR BTL (IV SOLUTION) ×3 IMPLANT
PAD ARMBOARD 7.5X6 YLW CONV (MISCELLANEOUS) ×6 IMPLANT
SCALPEL HARMONIC ACE (MISCELLANEOUS) IMPLANT
SCISSORS LAP 5X35 DISP (ENDOMECHANICALS) ×2 IMPLANT
SET IRRIG TUBING LAPAROSCOPIC (IRRIGATION / IRRIGATOR) IMPLANT
SLEEVE ENDOPATH XCEL 5M (ENDOMECHANICALS) ×4 IMPLANT
SPONGE GAUZE 2X2 STER 10/PKG (GAUZE/BANDAGES/DRESSINGS) ×2
STRIP CLOSURE SKIN 1/2X4 (GAUZE/BANDAGES/DRESSINGS) ×1 IMPLANT
SUT MNCRL AB 4-0 PS2 18 (SUTURE) ×3 IMPLANT
SUT PDS AB 0 CT 36 (SUTURE) ×4 IMPLANT
SUT PROLENE 1 CT (SUTURE) ×14 IMPLANT
TOWEL OR 17X24 6PK STRL BLUE (TOWEL DISPOSABLE) ×3 IMPLANT
TOWEL OR 17X26 10 PK STRL BLUE (TOWEL DISPOSABLE) ×3 IMPLANT
TRAY FOLEY CATH 14FRSI W/METER (CATHETERS) IMPLANT
TRAY LAPAROSCOPIC (CUSTOM PROCEDURE TRAY) ×3 IMPLANT
TROCAR XCEL NON-BLD 11X100MML (ENDOMECHANICALS) IMPLANT
TROCAR XCEL NON-BLD 5MMX100MML (ENDOMECHANICALS) ×4 IMPLANT

## 2013-05-14 NOTE — Op Note (Signed)
05/14/2013  9:28 AM  PATIENT:  Luke [name]  68 y.o. male  Patient Care Team: Tonia Ghent, MD as PCP - General Angelia Mould, MD as Attending Physician (Vascular Surgery) Carlena Bjornstad, MD (Cardiology) Arta Silence, MD as Consulting Physician (Gastroenterology) Adin Hector, MD as Consulting Physician (General Surgery)  PRE-OPERATIVE DIAGNOSIS:  incarcerated umbilical abdominal wall hernia   POST-OPERATIVE DIAGNOSIS:  incarcerated umbilical abdominal wall hernia   PROCEDURE:  Procedure(s): LAPAROSCOPIC exploration and repair of incarcerated umbilical abdominal wall hernia  INSERTION OF MESH  SURGEON:  Surgeon(s): Adin Hector, MD  ASSISTANT: RN   ANESTHESIA:   local and general  EBL:  Total I/O In: 1000 [I.V.:1000] Out: 500 [Urine:500]  Delay start of Pharmacological VTE agent (>24hrs) due to surgical blood loss or risk of bleeding:  no  DRAINS: none   SPECIMEN:  No Specimen  DISPOSITION OF SPECIMEN:  N/A  COUNTS:  YES  PLAN OF CARE: Discharge to home after PACU  PATIENT DISPOSITION:  PACU - hemodynamically stable.  INDICATION: Pleasant patient has developed a ventral wall abdominal hernia.   Recommendation was made for surgical repair:  The anatomy & physiology of the abdominal wall was discussed. The pathophysiology of hernias was discussed. Natural history risks without surgery including progeressive enlargement, pain, incarceration & strangulation was discussed. Contributors to complications such as smoking, obesity, diabetes, prior surgery, etc were discussed.  I feel the risks of no intervention will lead to serious problems that outweigh the operative risks; therefore, I recommended surgery to reduce and repair the hernia. I explained laparoscopic techniques with possible need for an open approach. I noted the probable use of mesh to patch and/or buttress the hernia repair  Risks such as bleeding, infection, abscess, need for further  treatment, heart attack, death, and other risks were discussed. I noted a good likelihood this will help address the problem. Goals of post-operative recovery were discussed as well. Possibility that this will not correct all symptoms was explained. I stressed the importance of low-impact activity, aggressive pain control, avoiding constipation, & not pushing through pain to minimize risk of post-operative chronic pain or injury. Possibility of reherniation especially with smoking, obesity, diabetes, immunosuppression, and other health conditions was discussed. We will work to minimize complications.  An educational handout further explaining the pathology & treatment options was given as well. Questions were answered. The patient expresses understanding & wishes to proceed with surgery.   OR FINDINGS: 8.5UD periumbilical [place] incaercerated w omentum   Type of repair - Laparoscopic underlay repair   Name of mesh - Bard Ventralight dual sided (polypropylene / Seprafilm)  Size of mesh - Length 15 cm, Width 10 cm  Mesh overlap - 5 cm  Placement of mesh - Intraperitoneal underlay repair   DESCRIPTION:   Informed consent was confirmed. The patient underwent general anaesthesia without difficulty. The patient was positioned appropriately. VTE prevention in place. The patient's abdomen was clipped, prepped, & draped in a sterile fashion. Surgical timeout confirmed our plan.  The patient was positioned in reverse Trendelenburg. Abdominal entry was gained using optical entry technique in the left upper abdomen. Entry was clean. I induced carbon dioxide insufflation. Camera inspection revealed no injury. Extra ports were carefully placed under direct laparoscopic visualization.   I could see the hernia in the central abdomen.   I did laparoscopic lysis of adhesions to expose the entire anterior abdominal wall.  I primarily used and focused cold scissors.    I made  sure hemostasis was good.  I mapped out  the region using a needle passer.   To ensure that I would have at least 5 cm radial coverage outside of the hernia defect, I chose a 15x10 cm dual sided mesh.  I placed #1 Prolene stitches around its edge about every 5 cm = 10 total.  I rolled the mesh & placed into the peritoneal cavity through the hernia defect.  I unrolled  the mesh and positioned it appropriately.  I secured the mesh to cover up the hernia defect using a laparoscopic suture passer to pass the tails of the Prolene through the abdominal wall & tagged them with clamps.  I started out in four corners to make sure I had the mesh centered over the hernia defect appropriately, and then proceeded to work in quadrants.  We evacuated CO2 & desufflated the abdomen.  I tied the fascial stitches down.  I primarily closed the umbilical hernia defect transversely using 0 PDS interrupted stitches.  I reinsufflated the abdomen.  The mesh provided at least 5-10 cm circumferential coverage around the entire region of hernia defects.   I tacked the edges & central part of the mesh to the peritoneum/posterior rectus fascia with SecureStrap absorbable tacks.   Hemostasis was excellent.  I did reinspection. Hemostasis was good. Mesh laid well. Capnoperitoneum was evacuated. Ports were removed. I excised redundant umbilical skin.  The skin was closed with Monocryl at the port sites and Steri-Strips on the fascial stitch puncture sites. OnQ catheters were placed and the sheathes peeled away. On-Q pump was secured. Patient is being extubated to go to the recovery room. I'm about discussed operative findings with the patient's family.

## 2013-05-14 NOTE — Progress Notes (Signed)
Notified [employer]Sandy Sexton, of anesthesia requesting for the pacemaker/icd to be turned off. Lovey Newcomer stated she is en route and will be here in 15-20 minutes.

## 2013-05-14 NOTE — Discharge Instructions (Signed)

## 2013-05-14 NOTE — Preoperative (Signed)
Beta Blockers   Reason not to administer Beta Blockers:coreg 1-9

## 2013-05-14 NOTE — Progress Notes (Signed)
Patient has a pacemaker that was disabled for surgery. Luke [name] was paged at this time to come and turn it back on.

## 2013-05-14 NOTE — Anesthesia Procedure Notes (Signed)
Procedure Name: Intubation Date/Time: 05/14/2013 8:03 AM Performed by: Raphael Gibney T Pre-anesthesia Checklist: Patient identified, Timeout performed, Emergency Drugs available, Suction available and Patient being monitored Patient Re-evaluated:Patient Re-evaluated prior to inductionOxygen Delivery Method: Circle system utilized and Simple face mask Preoxygenation: Pre-oxygenation with 100% oxygen Intubation Type: IV induction Ventilation: Mask ventilation without difficulty Laryngoscope Size: Miller and 3 Grade View: Grade I Tube type: Oral Tube size: 7.5 mm Number of attempts: 1 Airway Equipment and Method: Patient positioned with wedge pillow and Stylet Placement Confirmation: ETT inserted through vocal cords under direct vision,  positive ETCO2 and breath sounds checked- equal and bilateral Secured at: 22 cm Tube secured with: Tape Dental Injury: Teeth and Oropharynx as per pre-operative assessment

## 2013-05-14 NOTE — Anesthesia Preprocedure Evaluation (Signed)
Anesthesia Evaluation  Patient identified by MRN, date of birth, ID band Patient awake    Reviewed: Allergy & Precautions, H&P , NPO status , Patient's Chart, lab work & pertinent test results, reviewed documented beta blocker date and time   Airway Mallampati: II TM Distance: >3 FB Neck ROM: full    Dental   Pulmonary neg pulmonary ROS, former smoker,  breath sounds clear to auscultation        Cardiovascular hypertension, On Medications and On Home Beta Blockers + CAD, + Past MI and + Peripheral Vascular Disease + dysrhythmias + pacemaker + Cardiac Defibrillator Rhythm:regular     Neuro/Psych negative neurological ROS     GI/Hepatic negative GI ROS, Neg liver ROS,   Endo/Other  diabetes, Oral Hypoglycemic Agents  Renal/GU negative Renal ROS  negative genitourinary   Musculoskeletal   Abdominal   Peds  Hematology negative hematology ROS (+)   Anesthesia Other Findings See surgeon's H&P   Reproductive/Obstetrics negative OB ROS                           Anesthesia Physical Anesthesia Plan  ASA: III  Anesthesia Plan: General   Post-op Pain Management:    Induction: Intravenous  Airway Management Planned: Oral ETT  Additional Equipment:   Intra-op Plan:   Post-operative Plan: Extubation in OR  Informed Consent: I have reviewed the patients History and Physical, chart, labs and discussed the procedure including the risks, benefits and alternatives for the proposed anesthesia with the patient or authorized representative who has indicated his/her understanding and acceptance.   Dental Advisory Given  Plan Discussed with: CRNA and Surgeon  Anesthesia Plan Comments:         Anesthesia Quick Evaluation

## 2013-05-14 NOTE — Progress Notes (Signed)
Luke [name] is at bedside turning patient's AICD back on. Dr. Albertina Parr was at bedside checking on patient, discussed the patient CBG of 284, instructions are to treat at home. Patient just weaning off O2s and will be ready to go home.

## 2013-05-14 NOTE — H&P (Signed)
Luke [name]  1945/12/13 [id number]  CARE TEAM:  PCP: Elsie Stain, MD  Outpatient Care Team: Patient Care Team: Tonia Ghent, MD as PCP - General Angelia Mould, MD as Attending Physician (Vascular Surgery) Carlena Bjornstad, MD (Cardiology) Arta Silence, MD as Consulting Physician (Gastroenterology) Adin Hector, MD as Consulting Physician (General Surgery)  Inpatient Treatment Team: Treatment Team: Attending Provider: Adin Hector, MD  This patient is a 68 y.o.male who presents today for surgical evaluation at the request of Dr. Damita Dunnings.   Reason for visit: Symptomatic "stuck" hernia   Pleasant active male. History of stable coronary disease with a defibrillator and good exercise tolerance. History of abdominal aortic aneurysm status post successful stenting last year. He has had a lump at his belly button for the past decade. He usually has not been bothered by it. However, he has noticed his gotten larger. It has gotten more sensitive. He has been working out at [employer] a more regularly to keep his weight down and improve his overall health. He notes after doing sit-ups and workout, the lump is more sensitive. It is out all the time now.   He can walk 2 miles without difficulty. Has a bowel movement every day. History of polyps in the past. Due for followup colonoscopy with Dr. Paulita Fujita with [facility] soon. No personal nor family history of GI/colon cancer, inflammatory bowel disease, irritable bowel syndrome, allergy such as Celiac Sprue, dietary/dairy problems, colitis, ulcers nor gastritis. No recent sick contacts/gastroenteritis. No travel outside the country. No changes in diet. 2 history of MRSA or skin infections. No history of abdominal surgeries.   Cleared by cardiology (Dr [name])   Past Medical History  Diagnosis Date  . Diabetes mellitus     type II  . PAD (peripheral artery disease)   . AAA (abdominal aortic aneurysm) 2011    Per vascular  surgery  . HLD (hyperlipidemia)   . Hypertension     white coat HTN-- often elevated in office and controlled on outside checks.  . Low testosterone     Hx of  . Cardiomyopathy     Nuclear, October 09, 2011, EF 30%, multiple focal wall motion abnormalities  . Ejection fraction < 50%     EF 30%, nuclear, October 09, 2011  . LBBB (left bundle branch block)     LBBB on EKG October 11, 2011,  no prior EKG has been done  . CAD (coronary artery disease)     Presumed CAD with nuclear scan October 09, 2011,  large anteroseptal MI and inferior MI. Catheterization scheduled October 15, 2011  . Anxiety     no med. in use for anxiety but pt. speaks openly of his stress & anxious feelings regarding impending surgery    . ICD (implantable cardioverter-defibrillator) in place     CRT-D placed March, 2014 complete heart block and k dysfunction  . Ejection fraction < 50%     EF previously 30%  //   EF 30-35%, echo, July 14, 2012, severe diffuse hypokinesis, PA pressure 43 mm mercury  . Pacemaker   . Myocardial infarction   . Drug therapy     Hyperkalemia with spironolactone  . Automatic implantable cardioverter-defibrillator in situ     Past Surgical History  Procedure Laterality Date  . Tonsillectomy      as a child   . Colonoscopy    . Cardiac catheterization    . Abdominal aortic aneurysm repair  EVAR   . Pacemaker insertion  07-16-12    History   Social History  . Marital Status: Married    Spouse Name: N/A    Number of Children: N/A  . Years of Education: N/A   Occupational History  . Not on file.   Social History Main Topics  . Smoking status: Former Smoker -- 2.00 packs/day for 40 years    Types: Cigarettes    Quit date: 05/06/2004  . Smokeless tobacco: Never Used     Comment: quit about 7 years ago  . Alcohol Use: 1.2 oz/week    2 Cans of beer per week     Comment: beer or wine  occassionally  . Drug Use: No  . Sexual Activity: Not on file   Other Topics Concern  . 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 Hypertension Mother   . Stroke Mother   . Diabetes Sister   . Heart disease Sister   . Hypertension Sister   . Colon cancer Neg Hx   . Prostate cancer Neg Hx   . Heart disease Father     Current Facility-Administered Medications  Medication Dose Route Frequency Provider Last Rate Last Dose  . ceFAZolin (ANCEF) IVPB 2 g/50 mL premix  2 g Intravenous 60 min Pre-Op Adin Hector, MD         Allergies  Allergen Reactions  . Aldactone [Spironolactone] Other (See Comments)    Hyperkalemia  . Codeine Hives    ROS: Constitutional:  No fevers, chills, sweats.  Weight stable Eyes:  No vision changes, No discharge HENT:  No sore throats, nasal drainage Lymph: No neck swelling, No bruising easily Pulmonary:  No cough, productive sputum CV: No orthopnea, PND  Patient walks 15 minutes for about 1/4 miles without difficulty.  No exertional chest/neck/shoulder/arm pain. GI:  No personal nor family history of GI/colon cancer, inflammatory bowel disease, irritable bowel syndrome, allergy such as Celiac Sprue, dietary/dairy problems, colitis, ulcers nor gastritis.  No recent sick contacts/gastroenteritis.  No travel outside the country.  No changes in diet. Renal: No UTIs, No hematuria Genital:  No drainage, bleeding, masses Musculoskeletal: No severe joint pain.  Good ROM major joints Skin:  No sores or lesions.  No rashes Heme/Lymph:  No easy bleeding.  No swollen lymph nodes Neuro: No focal weakness/numbness.  No seizures Psych: No suicidal ideation.  No hallucinations  BP 175/76  Pulse 61  Temp(Src) 98.1 F (36.7 C) (Oral)  Resp 18  SpO2 99%  Physical Exam: General: Pt awake/alert/oriented x4 in  no major acute distress Eyes: PERRL, normal EOM. Sclera nonicteric Neuro: CN II-XII intact w/o focal sensory/motor deficits. Lymph: No head/neck/groin lymphadenopathy Psych:   No delerium/psychosis/paranoia HENT: Normocephalic, Mucus membranes moist.  No thrush Neck: Supple, No tracheal deviation Chest: No pain.  Good respiratory excursion. CV:  Pulses intact.   Regular rhythm Abdomen: Soft, Nondistended.  Incarcerated umb hernia unchanged 4cm Ext:  SCDs BLE.  No significant edema.  No cyanosis Skin: No petechiae / purpurea.  No major sores Musculoskeletal: No severe joint pain.  Good ROM major joints   Results:   Labs: Results for orders placed during the hospital encounter of 05/14/13 (from the past 48 hour(s))  GLUCOSE, CAPILLARY     Status: Abnormal   Collection Time    05/14/13  6:20 AM      Result  Value Range   Glucose-Capillary 203 (*) 70 - 99 mg/dL    Imaging / Studies: No results found.  Medications / Allergies: per chart  Antibiotics: Anti-infectives   Start     Dose/Rate Route Frequency Ordered Stop   05/14/13 0600  ceFAZolin (ANCEF) IVPB 2 g/50 mL premix     2 g 100 mL/hr over 30 Minutes Intravenous 60 min pre-op 05/13/13 1458        Assessment  Luke [name]  68 y.o. male  Day of Surgery  Procedure(s): LAPAROSCOPIC exploration and repair of hernia in abdominal  INSERTION OF MESH  Problem List:  Active Problems:   * No active hospital problems. *   Incarcerated umbilical hernia.  Plan:   Given the fact has gotten slightly larger and is bothersome with his workouts, I offered surgical repair. He wishes to be aggressive and repair this. Given its size and some of his abdominal obesity, I would recommend mesh repair. I would do a laparoscopic approach. He agrees:  The anatomy & physiology of the abdominal wall and pelvic floor was discussed. The pathophysiology of hernias in the inguinal and pelvic region was discussed. Natural history risks such as progressive enlargement, pain, incarceration & strangulation was discussed. Contributors to complications such as smoking, obesity, diabetes, prior surgery, etc were discussed.  I  feel the risks of no intervention will lead to serious problems that outweigh the operative risks; therefore, I recommended surgery to reduce and repair the hernia. I explained laparoscopic techniques with possible need for an open approach. I noted usual use of mesh to patch and/or buttress hernia repair  Risks such as bleeding, infection, abscess, need for further treatment, heart attack, death, and other risks were discussed. I noted a good likelihood this will help address the problem. Goals of post-operative recovery were discussed as well. Possibility that this will not correct all symptoms was explained. I stressed the importance of low-impact activity, aggressive pain control, avoiding constipation, & not pushing through pain to minimize risk of post-operative chronic pain or injury. Possibility of reherniation was discussed. We will work to minimize complications.  An educational handout further explaining the pathology & treatment options was given as well. Questions were answered. The patient expresses understanding & wishes to proceed with surgery   Adin Hector, M.D., F.A.C.S. Gastrointestinal and Minimally Invasive Surgery [facility] [address], [place], [place] [address] [phone] Main / Paging   05/14/2013  Note: This dictation was prepared with Dragon/digital dictation along with [facility] technology. Any transcriptional errors that result from this process are unintentional.

## 2013-05-14 NOTE — Anesthesia Postprocedure Evaluation (Signed)
Anesthesia Post Note  Patient: Luke [name]  Procedure(s) Performed: Procedure(s) (LRB): LAPAROSCOPIC exploration and repair of hernia in abdominal  (N/A) INSERTION OF MESH (N/A)  Anesthesia type: General  Patient location: PACU  Post pain: Pain level controlled  Post assessment: Patient's Cardiovascular Status Stable  Last Vitals:  Filed Vitals:   05/14/13 1015  BP: 163/70  Pulse: 59  Temp:   Resp: 12    Post vital signs: Reviewed and stable  Level of consciousness: alert  Complications: No apparent anesthesia complications AICD tech at bedside.

## 2013-05-14 NOTE — Transfer of Care (Signed)
Immediate Anesthesia Transfer of Care Note  Patient: Luke [name]  Procedure(s) Performed: Procedure(s): LAPAROSCOPIC exploration and repair of hernia in abdominal  (N/A) INSERTION OF MESH (N/A)  Patient Location: PACU  Anesthesia Type:General  Level of Consciousness: awake, alert  and oriented  Airway & Oxygen Therapy: Patient connected to face mask oxygen  Post-op Assessment: Report given to PACU RN  Post vital signs: stable  Complications: No apparent anesthesia complications

## 2013-05-17 ENCOUNTER — Encounter (HOSPITAL_COMMUNITY): Payer: Self-pay | Admitting: Surgery

## 2013-06-18 ENCOUNTER — Encounter (INDEPENDENT_AMBULATORY_CARE_PROVIDER_SITE_OTHER): Payer: Self-pay | Admitting: Surgery

## 2013-06-18 ENCOUNTER — Ambulatory Visit (INDEPENDENT_AMBULATORY_CARE_PROVIDER_SITE_OTHER): Payer: Medicare Other | Admitting: Surgery

## 2013-06-18 VITALS — BP 192/98 | HR 68 | Temp 98.0°F | Resp 14 | Ht 75.0 in | Wt 188.4 lb

## 2013-06-18 DIAGNOSIS — K420 Umbilical hernia with obstruction, without gangrene: Secondary | ICD-10-CM

## 2013-06-18 NOTE — Progress Notes (Signed)
Subjective:     Patient ID: Luke [name], male   DOB: 07-Feb-1946, 68 y.o.   MRN: [id number]  HPI  Note: This dictation was prepared with Dragon/digital dictation along with [facility] technology. Any transcriptional errors that result from this process are unintentional.       Luke [name]  [birth year] [id number]  Patient Care Team: Tonia Ghent, MD as PCP - General Angelia Mould, MD as Attending Physician (Vascular Surgery) Carlena Bjornstad, MD (Cardiology) Arta Silence, MD as Consulting Physician (Gastroenterology) Adin Hector, MD as Consulting Physician (General Surgery)  Procedure (Date: 05/14/2013):  POST-OPERATIVE DIAGNOSIS: incarcerated umbilical abdominal wall hernia   PROCEDURE: Procedure(s):  LAPAROSCOPIC exploration and repair of incarcerated umbilical abdominal wall hernia  INSERTION OF MESH   SURGEON:   Adin Hector, MD   OR FINDINGS: 3.8VF periumbilical [place] incaercerated w omentum  Type of repair - Laparoscopic underlay repair  Name of mesh - Bard Ventralight dual sided (polypropylene / Seprafilm)  Size of mesh - Length 15 cm, Width 10 cm  Mesh overlap - 5 cm  Placement of mesh - Intraperitoneal underlay repair   This patient returns for surgical re-evaluation.  He feels quite well.  He had a sore first few days.  Then the pain faded away quickly.  He went back to the gym.  Has gradually increased to his usual 150 wraps on abdominal crunches.  Denies pain.  Mild twinge in the right lower quadrant but nearly gone.  He is very pleased at well how well things have gone so for  Patient Active Problem List   Diagnosis Date Noted  . Biventricular defibrillator-St. Jude 04/20/2013  . Drug therapy   . Umbilical hernia, incarcerated s/p lap repair with mesh 05/14/2013 03/19/2013  . Abdominal aneurysm without mention of rupture 12/30/2012  . HLD (hyperlipidemia) 09/16/2012  . Ejection fraction < 50%   . Complete heart block 07/14/2012  . SK  (seborrheic keratosis) 02/12/2012  . Shoulder pain 11/08/2011  . CAD (coronary artery disease)   . Ischemic cardiomyopathy   . LBBB (left bundle branch block)   . PVD (peripheral vascular disease) 10/08/2010  . ORGANIC IMPOTENCE 07/05/2010  . ESSENTIAL HYPERTENSION, BENIGN 02/27/2010  . Type 2 diabetes mellitus with vascular disease 11/16/2009    Past Medical History  Diagnosis Date  . Diabetes mellitus     type II  . PAD (peripheral artery disease)   . AAA (abdominal aortic aneurysm) 2011    Per vascular surgery  . HLD (hyperlipidemia)   . Hypertension     white coat HTN-- often elevated in office and controlled on outside checks.  . Low testosterone     Hx of  . Cardiomyopathy     Nuclear, October 09, 2011, EF 30%, multiple focal wall motion abnormalities  . Ejection fraction < 50%     EF 30%, nuclear, October 09, 2011  . LBBB (left bundle branch block)     LBBB on EKG October 11, 2011,  no prior EKG has been done  . CAD (coronary artery disease)     Presumed CAD with nuclear scan October 09, 2011,  large anteroseptal MI and inferior MI. Catheterization scheduled October 15, 2011  . Anxiety     no med. in use for anxiety but pt. speaks openly of his stress & anxious feelings regarding impending surgery    . ICD (implantable cardioverter-defibrillator) in place     CRT-D placed March, 2014 complete heart block and k dysfunction  .  Ejection fraction < 50%     EF previously 30%  //   EF 30-35%, echo, July 14, 2012, severe diffuse hypokinesis, PA pressure 43 mm mercury  . Pacemaker   . Myocardial infarction   . Drug therapy     Hyperkalemia with spironolactone  . Automatic implantable cardioverter-defibrillator in situ     Past Surgical History  Procedure Laterality Date  . Tonsillectomy      as a child   . Colonoscopy    . Cardiac catheterization    . Abdominal aortic aneurysm repair      EVAR   . Pacemaker insertion  07-16-12  . Umbilical hernia repair N/A 05/14/2013    Procedure:  LAPAROSCOPIC exploration and repair of hernia in abdominal ;  Surgeon: Adin Hector, MD;  Location: [facility];  Service: General;  Laterality: N/A;  . Insertion of mesh N/A 05/14/2013    Procedure: INSERTION OF MESH;  Surgeon: Adin Hector, MD;  Location: [facility];  Service: General;  Laterality: N/A;    History   Social History  . Marital Status: Married    Spouse Name: N/A    Number of Children: N/A  . Years of Education: N/A   Occupational History  . Not on file.   Social History Main Topics  . Smoking status: Former Smoker -- 2.00 packs/day for 40 years    Types: Cigarettes    Quit date: 05/06/2004  . Smokeless tobacco: Never Used     Comment: quit about 7 years ago  . Alcohol Use: 1.2 oz/week    2 Cans of beer per week     Comment: beer or wine  occassionally  . Drug Use: No  . Sexual Activity: Not on file   Other Topics Concern  . Not on file   Social History Narrative   [place] vet   retired    Family History  Problem Relation Age of Onset  . Hypertension Mother   . Stroke Mother   . Diabetes Sister   . Heart disease Sister   . Hypertension Sister   . Colon cancer Neg Hx   . Prostate cancer Neg Hx   . Heart disease Father     Current Outpatient Prescriptions  Medication Sig Dispense Refill  . ACCU-CHEK AVIVA PLUS test strip CHECK BLOOD SUGAR three times a day 2 hours after meals  100 each  PRN  . aspirin EC 81 MG tablet Take 81 mg by mouth daily.      . carvedilol (COREG) 25 MG tablet Take 1 tablet (25 mg total) by mouth 2 (two) times daily with a meal.  60 tablet  9  . glipiZIDE (GLUCOTROL) 5 MG tablet Take 5 mg by mouth 2 (two) times daily before a meal.      . lisinopril (PRINIVIL,ZESTRIL) 20 MG tablet Take 1 tablet (20 mg total) by mouth 2 (two) times daily.  180 tablet  3  . metFORMIN (GLUCOPHAGE) 500 MG tablet Take 1,000 mg by mouth 2 (two) times daily with a meal.      . Multiple Vitamin (MULTIVITAMIN) tablet Take 1 tablet by mouth daily.       .  sildenafil (VIAGRA) 50 MG tablet Take 50 mg by mouth daily as needed for erectile dysfunction.      . simvastatin (ZOCOR) 40 MG tablet Take 40 mg by mouth at bedtime.       No current facility-administered medications for this visit.     Allergies  Allergen  Reactions  . Aldactone [Spironolactone] Other (See Comments)    Hyperkalemia  . Codeine Hives    BP 192/98  Pulse 68  Temp(Src) 98 F (36.7 C) (Oral)  Resp 14  Ht 6\' 3"  (1.905 m)  Wt 188 lb 6.4 oz (85.458 kg)  BMI 23.55 kg/m2  No results found.   Review of Systems  Constitutional: Negative for fever, chills and diaphoresis.  HENT: Negative for sore throat and trouble swallowing.   Eyes: Negative for photophobia and visual disturbance.  Respiratory: Negative for choking and shortness of breath.   Cardiovascular: Negative for chest pain and palpitations.  Gastrointestinal: Negative for nausea, vomiting, abdominal distention, anal bleeding and rectal pain.  Genitourinary: Negative for dysuria, urgency, difficulty urinating and testicular pain.  Musculoskeletal: Negative for arthralgias, gait problem, myalgias and neck pain.  Skin: Negative for color change and rash.  Neurological: Negative for dizziness, speech difficulty, weakness and numbness.  Hematological: Negative for adenopathy.  Psychiatric/Behavioral: Negative for hallucinations, confusion and agitation.       Objective:   Physical Exam  Constitutional: He is oriented to person, place, and time. He appears well-developed and well-nourished. No distress.  HENT:  Head: Normocephalic.  Mouth/Throat: Oropharynx is clear and moist. No oropharyngeal exudate.  Eyes: Conjunctivae and EOM are normal. Pupils are equal, round, and reactive to light. No scleral icterus.  Neck: Normal range of motion. No tracheal deviation present.  Cardiovascular: Normal rate, normal heart sounds and intact distal pulses.   Pulmonary/Chest: Effort normal. No respiratory distress.   Abdominal: Soft. He exhibits no distension. There is no tenderness. Hernia confirmed negative in the right inguinal area and confirmed negative in the left inguinal area.  Incisions clean with normal healing ridges.  No hernias  Musculoskeletal: Normal range of motion. He exhibits no tenderness.  Neurological: He is alert and oriented to person, place, and time. No cranial nerve deficit. He exhibits normal muscle tone. Coordination normal.  Skin: Skin is warm and dry. No rash noted. He is not diaphoretic.  Psychiatric: He has a normal mood and affect. His behavior is normal.       Assessment:     Recovering quite well one month status post repair of incarcerated umbilical hernia with mesh     Plan:     I am glad that his recovery has gone well so far.   Increase activity as tolerated to regular activity.  Low impact exercise such as walking an hour a day at least ideal.  Do not push through pain.  Diet as tolerated.  Low fat high fiber diet ideal.  Bowel regimen with 30 g fiber a day and fiber supplement as needed to avoid problems.  Return to clinic as needed.   Instructions discussed.  Followup with primary care physician for other health issues as would normally be done.  Consider screening for malignancies (breast, prostate, colon, melanoma, etc) as appropriate.  Questions answered.  The patient expressed understanding and appreciation

## 2013-06-18 NOTE — Patient Instructions (Signed)
HERNIA REPAIR: POST OP INSTRUCTIONS  1. DIET: Follow a light bland diet the first 24 hours after arrival home, such as soup, liquids, crackers, etc.  Be sure to include lots of fluids daily.  Avoid fast food or heavy meals as your are more likely to get nauseated.  Eat a low fat the next few days after surgery. 2. Take your usually prescribed home medications unless otherwise directed. 3. PAIN CONTROL: a. Pain is best controlled by a usual combination of three different methods TOGETHER: i. Ice/Heat ii. Over the counter pain medication iii. Prescription pain medication b. Most patients will experience some swelling and bruising around the hernia(s) such as the bellybutton, groins, or old incisions.  Ice packs or heating pads (30-60 minutes up to 6 times a day) will help. Use ice for the first few days to help decrease swelling and bruising, then switch to heat to help relax tight/sore spots and speed recovery.  Some people prefer to use ice alone, heat alone, alternating between ice & heat.  Experiment to what works for you.  Swelling and bruising can take several weeks to resolve.   c. It is helpful to take an over-the-counter pain medication regularly for the first few weeks.  Choose one of the following that works best for you: i. Naproxen (Aleve, etc)  Two 253m tabs twice a day ii. Ibuprofen (Advil, etc) Three 2034mtabs four times a day (every meal & bedtime) iii. Acetaminophen (Tylenol, etc) 325-65032mour times a day (every meal & bedtime) d. A  prescription for pain medication should be given to you upon discharge.  Take your pain medication as prescribed.  i. If you are having problems/concerns with the prescription medicine (does not control pain, nausea, vomiting, rash, itching, etc), please call us [phone] see if we need to switch you to a different pain medicine that will work better for you and/or control your side effect better. ii. If you need a refill on your pain  medication, please contact your pharmacy.  They will contact our office to request authorization. Prescriptions will not be filled after 5 pm or on week-ends. 4. Avoid getting constipated.  Between the surgery and the pain medications, it is common to experience some constipation.  Increasing fluid intake and taking a fiber supplement (such as Metamucil, Citrucel, FiberCon, MiraLax, etc) 1-2 times a day regularly will usually help prevent this problem from occurring.  A mild laxative (prune juice, Milk of Magnesia, MiraLax, etc) should be taken according to package directions if there are no bowel movements after 48 hours.   5. Wash / shower every day.  You may shower over the dressings as they are waterproof.   6. Remove your waterproof bandages 5 days after surgery.  You may leave the incision open to air.  You may replace a dressing/Band-Aid to cover the incision for comfort if you wish.  Continue to shower over incision(s) after the dressing is off.    7. ACTIVITIES as tolerated:   a. You may resume regular (light) daily activities beginning the next day-such as daily self-care, walking, climbing stairs-gradually increasing activities as tolerated.  If you can walk 30 minutes without difficulty, it is safe to try more intense activity such as jogging, treadmill, bicycling, low-impact aerobics, swimming, etc. b. Save the most intensive and strenuous activity for last such as sit-ups, heavy lifting, contact sports, etc  Refrain from any heavy lifting or straining until you are off narcotics for pain control.  c. DO NOT PUSH THROUGH PAIN.  Let pain be your guide: If it hurts to do something, don't do it.  Pain is your body warning you to avoid that activity for another week until the pain goes down. d. You may drive when you are no longer taking prescription pain medication, you can comfortably wear a seatbelt, and you can safely maneuver your car and apply brakes. e. You may have sexual intercourse  when it is comfortable.  8. FOLLOW UP in our office a. Please call [facility] at [phone] to set up an appointment to see your surgeon in the office for a follow-up appointment approximately 2-3 weeks after your surgery. b. Make sure that you call for this appointment the day you arrive home to insure a convenient appointment time. 9.  IF YOU HAVE DISABILITY OR FAMILY LEAVE FORMS, BRING THEM TO THE OFFICE FOR PROCESSING.  DO NOT GIVE THEM TO YOUR DOCTOR.  WHEN TO CALL US [phone]: 1. Poor pain control 2. Reactions / problems with new medications (rash/itching, nausea, etc)  3. Fever over 101.5 F (38.5 C) 4. Inability to urinate 5. Nausea and/or vomiting 6. Worsening swelling or bruising 7. Continued bleeding from incision. 8. Increased pain, redness, or drainage from the incision   The clinic staff is available to answer your questions during regular business hours (8:30am-5pm).  Please don't hesitate to call and ask to speak to one of our nurses for clinical concerns.   If you have a medical emergency, go to the nearest emergency room or call 911.  A surgeon from [facility] is always on call at the hospitals in [place]  [facility] [address], [address], [place], [place]  [address] ?  [address] [address], [place], [place]   [address] MAIN: [phone] ? TOLL FREE: [phone] ? FAX: [phone] [email or web address]  Exercise to Stay Healthy Exercise helps you become and stay healthy. EXERCISE IDEAS AND TIPS Choose exercises that:  You enjoy.  Fit into your day. You do not need to exercise really hard to be healthy. You can do exercises at a slow or medium level and stay healthy. You can:  Stretch before and after working out.  Try yoga, Pilates, or tai chi.  Lift weights.  Walk fast, swim, jog, run, climb stairs, bicycle, dance, or rollerskate.  Take aerobic classes. Exercises that burn about 150 calories:  Running  1  miles in 15 minutes.  Playing volleyball for 45 to 60 minutes.  Washing and waxing a car for 45 to 60 minutes.  Playing touch football for 45 minutes.  Walking 1  miles in 35 minutes.  Pushing a stroller 1  miles in 30 minutes.  Playing basketball for 30 minutes.  Raking leaves for 30 minutes.  Bicycling 5 miles in 30 minutes.  Walking 2 miles in 30 minutes.  Dancing for 30 minutes.  Shoveling snow for 15 minutes.  Swimming laps for 20 minutes.  Walking up stairs for 15 minutes.  Bicycling 4 miles in 15 minutes.  Gardening for 30 to 45 minutes.  Jumping rope for 15 minutes.  Washing windows or floors for 45 to 60 minutes. Document Released: 05/25/2010 Document Revised: 07/15/2011 Document Reviewed: 05/25/2010 [employer] Patient Information 2014 [employer].  

## 2013-06-24 ENCOUNTER — Inpatient Hospital Stay (HOSPITAL_COMMUNITY)
Admission: EM | Admit: 2013-06-24 | Discharge: 2013-06-29 | DRG: 052 | Disposition: A | Discharge status: Home Health | Payer: Medicare Other | Attending: Neurological Surgery | Admitting: Neurological Surgery

## 2013-06-24 ENCOUNTER — Encounter (HOSPITAL_COMMUNITY): Payer: Self-pay | Admitting: Emergency Medicine

## 2013-06-24 ENCOUNTER — Emergency Department (HOSPITAL_COMMUNITY): Payer: Medicare Other

## 2013-06-24 DIAGNOSIS — S14129A Central cord syndrome at unspecified level of cervical spinal cord, initial encounter: Secondary | ICD-10-CM | POA: Diagnosis present

## 2013-06-24 DIAGNOSIS — Z95810 Presence of automatic (implantable) cardiac defibrillator: Secondary | ICD-10-CM

## 2013-06-24 DIAGNOSIS — R488 Other symbolic dysfunctions: Secondary | ICD-10-CM | POA: Diagnosis present

## 2013-06-24 DIAGNOSIS — W009XXA Unspecified fall due to ice and snow, initial encounter: Secondary | ICD-10-CM

## 2013-06-24 DIAGNOSIS — M4802 Spinal stenosis, cervical region: Secondary | ICD-10-CM | POA: Diagnosis present

## 2013-06-24 DIAGNOSIS — R279 Unspecified lack of coordination: Secondary | ICD-10-CM | POA: Diagnosis present

## 2013-06-24 DIAGNOSIS — Z7982 Long term (current) use of aspirin: Secondary | ICD-10-CM

## 2013-06-24 DIAGNOSIS — I2510 Atherosclerotic heart disease of native coronary artery without angina pectoris: Secondary | ICD-10-CM | POA: Diagnosis present

## 2013-06-24 DIAGNOSIS — I10 Essential (primary) hypertension: Secondary | ICD-10-CM | POA: Diagnosis present

## 2013-06-24 DIAGNOSIS — I252 Old myocardial infarction: Secondary | ICD-10-CM

## 2013-06-24 DIAGNOSIS — I428 Other cardiomyopathies: Secondary | ICD-10-CM | POA: Diagnosis present

## 2013-06-24 DIAGNOSIS — T148XXA Other injury of unspecified body region, initial encounter: Secondary | ICD-10-CM | POA: Diagnosis not present

## 2013-06-24 DIAGNOSIS — M542 Cervicalgia: Secondary | ICD-10-CM | POA: Diagnosis not present

## 2013-06-24 DIAGNOSIS — S14121A Central cord syndrome at C1 level of cervical spinal cord, initial encounter: Secondary | ICD-10-CM | POA: Diagnosis not present

## 2013-06-24 DIAGNOSIS — Z79899 Other long term (current) drug therapy: Secondary | ICD-10-CM

## 2013-06-24 DIAGNOSIS — Z87891 Personal history of nicotine dependence: Secondary | ICD-10-CM

## 2013-06-24 DIAGNOSIS — E119 Type 2 diabetes mellitus without complications: Secondary | ICD-10-CM | POA: Diagnosis present

## 2013-06-24 DIAGNOSIS — M25519 Pain in unspecified shoulder: Secondary | ICD-10-CM | POA: Diagnosis not present

## 2013-06-24 DIAGNOSIS — M47812 Spondylosis without myelopathy or radiculopathy, cervical region: Secondary | ICD-10-CM | POA: Diagnosis not present

## 2013-06-24 DIAGNOSIS — W010XXA Fall on same level from slipping, tripping and stumbling without subsequent striking against object, initial encounter: Secondary | ICD-10-CM | POA: Diagnosis present

## 2013-06-24 DIAGNOSIS — E785 Hyperlipidemia, unspecified: Secondary | ICD-10-CM | POA: Diagnosis present

## 2013-06-24 HISTORY — DX: Unspecified fall due to ice and snow, initial encounter: W00.9XXA

## 2013-06-24 LAB — CBC
HCT: 40.6 % (ref 39.0–52.0)
Hemoglobin: 14.7 g/dL (ref 13.0–17.0)
MCH: 33.6 pg (ref 26.0–34.0)
MCHC: 36.2 g/dL — ABNORMAL HIGH (ref 30.0–36.0)
MCV: 92.7 fL (ref 78.0–100.0)
Platelets: 236 10*3/uL (ref 150–400)
RBC: 4.38 MIL/uL (ref 4.22–5.81)
RDW: 12.6 % (ref 11.5–15.5)
WBC: 15.8 10*3/uL — ABNORMAL HIGH (ref 4.0–10.5)

## 2013-06-24 LAB — BASIC METABOLIC PANEL
BUN: 33 mg/dL — ABNORMAL HIGH (ref 6–23)
CO2: 20 mEq/L (ref 19–32)
Calcium: 9.7 mg/dL (ref 8.4–10.5)
Chloride: 99 mEq/L (ref 96–112)
Creatinine, Ser: 1.16 mg/dL (ref 0.50–1.35)
GFR calc Af Amer: 74 mL/min — ABNORMAL LOW (ref >90)
GFR calc non Af Amer: 64 mL/min — ABNORMAL LOW (ref >90)
Glucose, Bld: 183 mg/dL — ABNORMAL HIGH (ref 70–99)
Potassium: 4.6 mEq/L (ref 3.7–5.3)
Sodium: 138 mEq/L (ref 137–147)

## 2013-06-24 LAB — CBG MONITORING, ED
Glucose-Capillary: 172 mg/dL — ABNORMAL HIGH (ref 70–99)
Glucose-Capillary: 183 mg/dL — ABNORMAL HIGH (ref 70–99)

## 2013-06-24 MED ORDER — SIMVASTATIN 40 MG PO TABS
40.0000 mg | ORAL_TABLET | Freq: Every day | ORAL | Status: DC
  Filled 2013-06-24: qty 1

## 2013-06-24 MED ORDER — CARVEDILOL 25 MG PO TABS
25.0000 mg | ORAL_TABLET | Freq: Two times a day (BID) | ORAL | Status: DC
  Administered 2013-06-25 – 2013-06-29 (×9): 25 mg via ORAL
  Filled 2013-06-24 (×11): qty 1

## 2013-06-24 MED ORDER — ACETAMINOPHEN 325 MG PO TABS
650.0000 mg | ORAL_TABLET | Freq: Once | ORAL | Status: AC
  Administered 2013-06-24: 650 mg via ORAL
  Filled 2013-06-24: qty 2

## 2013-06-24 MED ORDER — METFORMIN HCL 500 MG PO TABS
1000.0000 mg | ORAL_TABLET | Freq: Two times a day (BID) | ORAL | Status: DC
  Administered 2013-06-25 – 2013-06-29 (×9): 1000 mg via ORAL
  Filled 2013-06-24 (×11): qty 2

## 2013-06-24 MED ORDER — GLIPIZIDE 5 MG PO TABS
5.0000 mg | ORAL_TABLET | Freq: Two times a day (BID) | ORAL | Status: DC
  Administered 2013-06-25 – 2013-06-29 (×9): 5 mg via ORAL
  Filled 2013-06-24 (×11): qty 1

## 2013-06-24 MED ORDER — LISINOPRIL 20 MG PO TABS
20.0000 mg | ORAL_TABLET | Freq: Two times a day (BID) | ORAL | Status: DC
  Administered 2013-06-25 – 2013-06-29 (×10): 20 mg via ORAL
  Filled 2013-06-24 (×11): qty 1

## 2013-06-24 NOTE — ED Notes (Signed)
Pt reports to the ED for eval of posterior head pain following a fall from standing to concrete. Pt was walking his dog when he slipped and fell on ice. Pt reports his head and back took most of the impact. Pt arrives with C-collar and on LSB. Pt reports he takes an 81 mg of ASA daily. Pt denies any LOC. Pt also reports right arm pain. Pt was on the ground for approx 10 minutes. Pt reports generalized numbness and tingling. He reports he believes this is from lying on the ice for so long. Pt A&Ox4,resp e/u, and skin warm and dry.

## 2013-06-24 NOTE — H&P (Signed)
Subjective:   Patient is a 68 y.o. male seen regarding central cord syndrome after slip and fall on ice today. Walking his dog and slipped and fell back and "snapped my head back." Was initially "paralyzed" for a few minutes. Sxs are improving over time, left more than right. C/O tingling in hands and clumsiness. No history of neck pain. The patient first presented with complaints of neck pain, numbness of the arm(s) and loss of strength of the arm(s). Onset of symptoms was several  hours ago, gradually improving since that time. Onset was related to a fall. The pain is described as aching and occurs all day. The pain is rated mild, and is located at the neck and upper RUE. . The symptoms denies been progressive. Symptoms are exacerbated by extending head backwards, and are relieved by none. History positive for trauma, details: see above. Has Psychologist, forensic. Previous work up includes CT of cervical spine, results: OPLL with stenosis.  Past Medical History  Diagnosis Date  . Diabetes mellitus     type II  . PAD (peripheral artery disease)   . AAA (abdominal aortic aneurysm) 2011    Per vascular surgery  . HLD (hyperlipidemia)   . Hypertension     white coat HTN-- often elevated in office and controlled on outside checks.  . Low testosterone     Hx of  . Cardiomyopathy     Nuclear, October 09, 2011, EF 30%, multiple focal wall motion abnormalities  . Ejection fraction < 50%     EF 30%, nuclear, October 09, 2011  . LBBB (left bundle branch block)     LBBB on EKG October 11, 2011,  no prior EKG has been done  . CAD (coronary artery disease)     Presumed CAD with nuclear scan October 09, 2011,  large anteroseptal MI and inferior MI. Catheterization scheduled October 15, 2011  . Anxiety     no med. in use for anxiety but pt. speaks openly of his stress & anxious feelings regarding impending surgery    . ICD (implantable cardioverter-defibrillator) in place     CRT-D placed March, 2014 complete heart block and k  dysfunction  . Ejection fraction < 50%     EF previously 30%  //   EF 30-35%, echo, July 14, 2012, severe diffuse hypokinesis, PA pressure 43 mm mercury  . Pacemaker   . Myocardial infarction   . Drug therapy     Hyperkalemia with spironolactone  . Automatic implantable cardioverter-defibrillator in situ     Past Surgical History  Procedure Laterality Date  . Tonsillectomy      as a child   . Colonoscopy    . Cardiac catheterization    . Abdominal aortic aneurysm repair      EVAR   . Pacemaker insertion  07-16-12  . Umbilical hernia repair N/A 05/14/2013    Procedure: LAPAROSCOPIC exploration and repair of hernia in abdominal ;  Surgeon: Adin Hector, MD;  Location: [facility];  Service: General;  Laterality: N/A;  . Insertion of mesh N/A 05/14/2013    Procedure: INSERTION OF MESH;  Surgeon: Adin Hector, MD;  Location: [facility];  Service: General;  Laterality: N/A;    Allergies  Allergen Reactions  . Aldactone [Spironolactone] Other (See Comments)    Hyperkalemia  . Codeine Hives    History  Substance Use Topics  . Smoking status: Former Smoker -- 2.00 packs/day for 40 years    Types: Cigarettes  Quit date: 05/06/2004  . Smokeless tobacco: Never Used     Comment: quit about 7 years ago  . Alcohol Use: 1.2 oz/week    2 Cans of beer per week     Comment: beer or wine  occassionally    Family History  Problem Relation Age of Onset  . Hypertension Mother   . Stroke Mother   . Diabetes Sister   . Heart disease Sister   . Hypertension Sister   . Colon cancer Neg Hx   . Prostate cancer Neg Hx   . Heart disease Father    Prior to Admission medications   Medication Sig Start Date End Date Taking? Authorizing Provider  aspirin EC 81 MG tablet Take 81 mg by mouth daily.   Yes Historical Provider, MD  carvedilol (COREG) 25 MG tablet Take 1 tablet (25 mg total) by mouth 2 (two) times daily with a meal. 04/20/13 04/20/14 Yes Deboraha Sprang, MD  glipiZIDE (GLUCOTROL) 5 MG tablet  Take 5 mg by mouth 2 (two) times daily before a meal.   Yes Historical Provider, MD  lisinopril (PRINIVIL,ZESTRIL) 20 MG tablet Take 1 tablet (20 mg total) by mouth 2 (two) times daily. 02/24/13  Yes Carlena Bjornstad, MD  metFORMIN (GLUCOPHAGE) 500 MG tablet Take 1,000 mg by mouth 2 (two) times daily with a meal.   Yes Historical Provider, MD  Multiple Vitamin (MULTIVITAMIN) tablet Take 1 tablet by mouth daily.    Yes Historical Provider, MD  sildenafil (VIAGRA) 50 MG tablet Take 50 mg by mouth daily as needed for erectile dysfunction.   Yes Historical Provider, MD  simvastatin (ZOCOR) 40 MG tablet Take 40 mg by mouth at bedtime.   Yes Historical Provider, MD     Review of Systems  Positive ROS: neg  All other systems have been reviewed and were otherwise negative with the exception of those mentioned in the HPI and as above.  Objective: Vital signs in last 24 hours: Temp:  [97.6 F (36.4 C)] 97.6 F (36.4 C) (02/19 1829) Pulse Rate:  [72-87] 82 (02/19 2230) Resp:  [16] 16 (02/19 1829) BP: (145-194)/(74-96) 153/75 mmHg (02/19 2230) SpO2:  [91 %-98 %] 91 % (02/19 2230)  General Appearance: Alert, cooperative, no distress, appears stated age Head: Normocephalic, without obvious abnormality, atraumatic Eyes: PERRL, conjunctiva/corneas clear, EOM's intact, fundi benign, both eyes      Ears: Normal TM's and external ear canals, both ears Throat: Lips, mucosa, and tongue normal; teeth and gums normal Neck: Supple, symmetrical, trachea midline, in collar Back: Symmetric, no curvature, ROM normal, no CVA tenderness Lungs:  respirations unlabored Heart: Regular rate and rhythm,  Pacer left chest Abdomen: Soft   NEUROLOGIC:   Mental status: A&O x4, no aphasia, good AS, fund of knowledge and memory Motor Exam - grips 4-/5, RUE wrist ext and bicep/ tricep/ deltoid 4-/5. LUE 4/5 Sensory Exam - grossly normal Reflexes: hypoactive Coordination - clumsy Gait -not tested Balance - not  tested Cranial Nerves: I: smell Not tested  II: visual acuity  OS: na  OD: na  II: visual fields Full to confrontation  II: pupils Equal, round, reactive to light  III,VII: ptosis None  III,IV,VI: extraocular muscles  Full ROM  V: mastication Normal  V: facial light touch sensation  Normal  V,VII: corneal reflex  Present  VII: facial muscle function - upper  Normal  VII: facial muscle function - lower Normal  VIII: hearing Not tested  IX: soft palate elevation  Normal  IX,X: gag reflex Present  XI: trapezius strength  5/5  XI: sternocleidomastoid strength 5/5  XI: neck flexion strength  5/5  XII: tongue strength  Normal    Data Review Lab Results  Component Value Date   WBC 15.8* 06/24/2013   HGB 14.7 06/24/2013   HCT 40.6 06/24/2013   MCV 92.7 06/24/2013   PLT 236 06/24/2013   Lab Results  Component Value Date   NA 136* 05/10/2013   K 5.4* 05/10/2013   CL 100 05/10/2013   CO2 22 05/10/2013   BUN 35* 05/10/2013   CREATININE 1.33 05/10/2013   GLUCOSE 143* 05/10/2013   Lab Results  Component Value Date   INR 1.15 12/03/2011    Assessment/Plan: 68 yo wm with pacer who has mild central cord syndrome after fall. Has OPLL C2-4 with canal stenosis. Admit for PT/OT. Likely will need posterior cervical decompression and fusion C2-5 in a few weeks. on't believe early surgery is needed with neuro exam improving.  Meadow Abramo S 06/24/2013 11:23 PM

## 2013-06-24 NOTE — ED Provider Notes (Signed)
CSN: [id number]     Arrival date & time 06/24/13  1821 History   First MD Initiated Contact with Patient 06/24/13 1848     Chief Complaint  Patient presents with  . Fall     (Consider location/radiation/quality/duration/timing/severity/associated sxs/prior Treatment) HPI Comments: Pt slipped on ice and fell backward onto "spine" and head went back hard and hit the back of his head.  Not on coumadin.  No LOC.  Pt was stunned, couldn't move anything for a while, laid there for about 10-15 minutes per family.  Pt is moving more, but complains of "numbness" to arms and somewhat to legs. Pt reports movements are improving, but still not normal.  Pt has a pacermaker due to a h/o intermittnet heart block in setting of ischemic cardiomyapthy followed by Dr. Caryl Comes, has a h/o DM, hyperlipidemia.  Pt has had surgery with Dr. Johney Maine in the past for a hernia.  Pt reports had a similar situation after a fall to his back many years ago.    The history is provided by the patient, the spouse and a relative.    Past Medical History  Diagnosis Date  . Diabetes mellitus     type II  . PAD (peripheral artery disease)   . AAA (abdominal aortic aneurysm) 2011    Per vascular surgery  . HLD (hyperlipidemia)   . Hypertension     white coat HTN-- often elevated in office and controlled on outside checks.  . Low testosterone     Hx of  . Cardiomyopathy     Nuclear, October 09, 2011, EF 30%, multiple focal wall motion abnormalities  . Ejection fraction < 50%     EF 30%, nuclear, October 09, 2011  . LBBB (left bundle branch block)     LBBB on EKG October 11, 2011,  no prior EKG has been done  . CAD (coronary artery disease)     Presumed CAD with nuclear scan October 09, 2011,  large anteroseptal MI and inferior MI. Catheterization scheduled October 15, 2011  . Anxiety     no med. in use for anxiety but pt. speaks openly of his stress & anxious feelings regarding impending surgery    . ICD (implantable  cardioverter-defibrillator) in place     CRT-D placed March, 2014 complete heart block and k dysfunction  . Ejection fraction < 50%     EF previously 30%  //   EF 30-35%, echo, July 14, 2012, severe diffuse hypokinesis, PA pressure 43 mm mercury  . Pacemaker   . Myocardial infarction   . Drug therapy     Hyperkalemia with spironolactone  . Automatic implantable cardioverter-defibrillator in situ    Past Surgical History  Procedure Laterality Date  . Tonsillectomy      as a child   . Colonoscopy    . Cardiac catheterization    . Abdominal aortic aneurysm repair      EVAR   . Pacemaker insertion  07-16-12  . Umbilical hernia repair N/A 05/14/2013    Procedure: LAPAROSCOPIC exploration and repair of hernia in abdominal ;  Surgeon: Adin Hector, MD;  Location: [facility];  Service: General;  Laterality: N/A;  . Insertion of mesh N/A 05/14/2013    Procedure: INSERTION OF MESH;  Surgeon: Adin Hector, MD;  Location: [facility];  Service: General;  Laterality: N/A;   Family History  Problem Relation Age of Onset  . Hypertension Mother   . Stroke Mother   . Diabetes Sister   .  Heart disease Sister   . Hypertension Sister   . Colon cancer Neg Hx   . Prostate cancer Neg Hx   . Heart disease Father    History  Substance Use Topics  . Smoking status: Former Smoker -- 2.00 packs/day for 40 years    Types: Cigarettes    Quit date: 05/06/2004  . Smokeless tobacco: Never Used     Comment: quit about 7 years ago  . Alcohol Use: 1.2 oz/week    2 Cans of beer per week     Comment: beer or wine  occassionally    Review of Systems  Respiratory: Negative for cough and shortness of breath.   Cardiovascular: Negative for chest pain.  Gastrointestinal: Negative for nausea and vomiting.  Musculoskeletal: Positive for neck pain. Negative for back pain.  Neurological: Positive for weakness and numbness. Negative for syncope and headaches.  All other systems reviewed and are  negative.      Allergies  Aldactone and Codeine  Home Medications   Current Outpatient Rx  Name  Route  Sig  Dispense  Refill  . aspirin EC 81 MG tablet   Oral   Take 81 mg by mouth daily.         . carvedilol (COREG) 25 MG tablet   Oral   Take 1 tablet (25 mg total) by mouth 2 (two) times daily with a meal.   60 tablet   9   . glipiZIDE (GLUCOTROL) 5 MG tablet   Oral   Take 5 mg by mouth 2 (two) times daily before a meal.         . lisinopril (PRINIVIL,ZESTRIL) 20 MG tablet   Oral   Take 1 tablet (20 mg total) by mouth 2 (two) times daily.   180 tablet   3   . metFORMIN (GLUCOPHAGE) 500 MG tablet   Oral   Take 1,000 mg by mouth 2 (two) times daily with a meal.         . Multiple Vitamin (MULTIVITAMIN) tablet   Oral   Take 1 tablet by mouth daily.          . sildenafil (VIAGRA) 50 MG tablet   Oral   Take 50 mg by mouth daily as needed for erectile dysfunction.         . simvastatin (ZOCOR) 40 MG tablet   Oral   Take 40 mg by mouth at bedtime.          BP 153/75  Pulse 82  Temp(Src) 97.6 F (36.4 C) (Oral)  Resp 16  SpO2 91% Physical Exam  Nursing note and vitals reviewed. Constitutional: He is oriented to person, place, and time. He appears well-developed and well-nourished.  HENT:  Head: Normocephalic and atraumatic.  Eyes: Conjunctivae and EOM are normal. No scleral icterus.  Cardiovascular: Normal rate, regular rhythm and intact distal pulses.   Pulmonary/Chest: Effort normal. No respiratory distress. He has no wheezes.  Abdominal: Soft. There is no tenderness. There is no rebound and no guarding.  Genitourinary:  Intact sensation in perineum, tone  Neurological: He is alert and oriented to person, place, and time. He exhibits normal muscle tone. Coordination abnormal.  Reflex Scores:      Patellar reflexes are 2+ on the right side and 2+ on the left side. Skin: Skin is warm and dry. He is not diaphoretic.    ED Course   Procedures (including critical care time) Labs Review Labs Reviewed  CBG MONITORING, ED - Abnormal; Notable  for the following:    Glucose-Capillary 172 (*)    All other components within normal limits  CBG MONITORING, ED - Abnormal; Notable for the following:    Glucose-Capillary 183 (*)    All other components within normal limits  CBC  BASIC METABOLIC PANEL   Imaging Review Ct Cervical Spine Wo Contrast  06/24/2013   CLINICAL DATA:  Slipped on ice while walking his dog. Generalized numbness and tingling with right upper extremity pain.  EXAM: CT CERVICAL SPINE WITHOUT CONTRAST  TECHNIQUE: Multidetector CT imaging of the cervical spine was performed without intravenous contrast. Multiplanar CT image reconstructions were also generated.  COMPARISON:  None.  FINDINGS: No acute fractures involving the cervical spine. Severe long segment of ossification of the posterior longitudinal ligament extending from the mid dens to the upper endplate of C4. As a result, there is moderate spinal stenosis at C2-3 and severe spinal stenosis at the C3-4 level. Sagittal reconstructed images demonstrate anatomic alignment of the cervical spine. Moderate disc space narrowing at C5-6 and mild disc space narrowing at C6-7. Coronal reformatted images demonstrate an intact craniocervical junction, intact C1-C2 articulation, intact dens, and intact lateral masses throughout. Uncinate hypertrophy accounts for multilevel foraminal stenoses including mild right C3-4, severe right C4-5, moderate bilateral C5-6, and moderate bilateral C6-7.  IMPRESSION: 1. No cervical spine fractures identified. 2. Severe long segment ossification of the posterior longitudinal ligament extending from the mid dens to the upper endplate of C4. As a result, there is moderate spinal stenosis at C2-3 and severe spinal stenosis at the C3-4 level. 3. Given the patient's history of generalized numbness, cord contusion is suspected. Unenhanced MRI of the  cervical spine may be helpful for confirmation. 4. Degenerative changes and multilevel foraminal stenoses as detailed above.   Electronically Signed   By: Evangeline Dakin M.D.   On: 06/24/2013 20:07    EKG Interpretation   None      10:58 PM CT shows possible spinal stenosis.  Exam suggests central cord syndrome, but improving.  I discussed with D.r Ronnald Ramp who will see pt and admit.  Unable to get MRI due to pacemaker.  Pt did ask for tylenol later.  Neuro exam continues to improve, but still subjective tingling and not back to baseline.       MDM   Final diagnoses:  Central cord syndrome  Neck pain    Pt with trauma to cervical area in a flexion mechanism.   Pt with abn neuro exam, although improving, possibly a contusion to spinal cord. Pt is hypertensive, but mentally alert, conversant, cooperative.  Pt with poor finger to nose and heel to shin exams.  Abd soft, lungs clear.  Pt with mild neck pain, initially not requesting any pain meds.      Saddie Benders. Dorna Mai, MD 06/24/13 2259

## 2013-06-25 DIAGNOSIS — M542 Cervicalgia: Secondary | ICD-10-CM | POA: Diagnosis not present

## 2013-06-25 LAB — GLUCOSE, CAPILLARY
Glucose-Capillary: 214 mg/dL — ABNORMAL HIGH (ref 70–99)
Glucose-Capillary: 179 mg/dL — ABNORMAL HIGH (ref 70–99)
Glucose-Capillary: 127 mg/dL — ABNORMAL HIGH (ref 70–99)

## 2013-06-25 MED ORDER — ACETAMINOPHEN 325 MG PO TABS
650.0000 mg | ORAL_TABLET | ORAL | Status: DC | PRN
  Administered 2013-06-29: 650 mg via ORAL
  Filled 2013-06-25: qty 2

## 2013-06-25 MED ORDER — ASPIRIN EC 81 MG PO TBEC
81.0000 mg | DELAYED_RELEASE_TABLET | Freq: Every day | ORAL | Status: DC
  Administered 2013-06-25 – 2013-06-29 (×5): 81 mg via ORAL
  Filled 2013-06-25 (×5): qty 1

## 2013-06-25 MED ORDER — ONDANSETRON HCL 4 MG/2ML IJ SOLN: 4.0000 mg | INTRAMUSCULAR | Status: DC | PRN

## 2013-06-25 MED ORDER — SODIUM CHLORIDE 0.9 % IJ SOLN: 3.0000 mL | INTRAMUSCULAR | Status: DC | PRN

## 2013-06-25 MED ORDER — METHOCARBAMOL 100 MG/ML IJ SOLN: 500.0000 mg | Freq: Four times a day (QID) | INTRAMUSCULAR | Status: DC | PRN

## 2013-06-25 MED ORDER — ACETAMINOPHEN 650 MG RE SUPP
650.0000 mg | RECTAL | Status: DC | PRN
Start: 2013-06-25 — End: 2013-06-29

## 2013-06-25 MED ORDER — OXYCODONE-ACETAMINOPHEN 5-325 MG PO TABS
1.0000 | ORAL_TABLET | ORAL | Status: DC | PRN
  Administered 2013-06-25 – 2013-06-28 (×17): 2 via ORAL
  Filled 2013-06-25: qty 1
  Filled 2013-06-25 (×17): qty 2

## 2013-06-25 MED ORDER — METHOCARBAMOL 500 MG PO TABS
500.0000 mg | ORAL_TABLET | Freq: Four times a day (QID) | ORAL | Status: DC | PRN
  Administered 2013-06-26 – 2013-06-28 (×4): 500 mg via ORAL
  Filled 2013-06-25 (×4): qty 1

## 2013-06-25 MED ORDER — SODIUM CHLORIDE 0.9 % IJ SOLN
3.0000 mL | Freq: Two times a day (BID) | INTRAMUSCULAR | Status: DC
  Administered 2013-06-25 – 2013-06-27 (×2): 3 mL via INTRAVENOUS

## 2013-06-25 MED ORDER — POTASSIUM CHLORIDE IN NACL 20-0.9 MEQ/L-% IV SOLN
INTRAVENOUS | Status: DC
  Administered 2013-06-25 – 2013-06-26 (×3): via INTRAVENOUS
  Filled 2013-06-25 (×9): qty 1000

## 2013-06-25 MED ORDER — SODIUM CHLORIDE 0.9 % IV SOLN: 250.0000 mL | INTRAVENOUS | Status: DC

## 2013-06-25 NOTE — Consult Note (Signed)
Physical Medicine and Rehabilitation Consult  Reason for Consult: Central cord syndrome Referring Physician: Dr. Sherley Bounds   HPI: Luke [name] is a 68 y.o. male with history of DM, PAD, CM with ICD who slipped and fell on ice on 06/24/13. Patient fell backward, "snapped his head back" and had transient paralysis with left> right weakness for a few minutes.  Patient with complaints of BUE weakness with numbness as well as neck pain. CT cervical spine with moderate spinal stenosis C2-3 and severe spinal stenosis C3-4. Neurology feels that patient with central cord syndrome past fall complicated by [id number] canal stenosis. He will likely need decompressive surgery in a few weeks. OT evaluation done today and patient with ataxic gait as well as RUE weakness. MD, OT recommending CIR.   Review of Systems  HENT: Negative for hearing loss.   Eyes: Negative for blurred vision and double vision.  Respiratory: Negative for cough and shortness of breath.   Cardiovascular: Negative for chest pain and palpitations.  Gastrointestinal: Negative for nausea and diarrhea.  Genitourinary: Negative for urgency and frequency.  Musculoskeletal: Positive for neck pain. Negative for myalgias.  Neurological: Positive for sensory change (tingling bilateral hands and feet) and focal weakness (RUE). Negative for headaches.  Psychiatric/Behavioral: Negative for memory loss. The patient is not nervous/anxious.     Past Medical History  Diagnosis Date  . Diabetes mellitus     type II  . PAD (peripheral artery disease)   . AAA (abdominal aortic aneurysm) 2011    Per vascular surgery  . HLD (hyperlipidemia)   . Hypertension     white coat HTN-- often elevated in office and controlled on outside checks.  . Low testosterone     Hx of  . Cardiomyopathy     Nuclear, October 09, 2011, EF 30%, multiple focal wall motion abnormalities  . Ejection fraction < 50%     EF 30%, nuclear, October 09, 2011  . LBBB (left  bundle branch block)     LBBB on EKG October 11, 2011,  no prior EKG has been done  . CAD (coronary artery disease)     Presumed CAD with nuclear scan October 09, 2011,  large anteroseptal MI and inferior MI. Catheterization scheduled October 15, 2011  . Anxiety     no med. in use for anxiety but pt. speaks openly of his stress & anxious feelings regarding impending surgery    . ICD (implantable cardioverter-defibrillator) in place     CRT-D placed March, 2014 complete heart block and k dysfunction  . Ejection fraction < 50%     EF previously 30%  //   EF 30-35%, echo, July 14, 2012, severe diffuse hypokinesis, PA pressure 43 mm mercury  . Pacemaker   . Myocardial infarction   . Drug therapy     Hyperkalemia with spironolactone  . Automatic implantable cardioverter-defibrillator in situ    Past Surgical History  Procedure Laterality Date  . Tonsillectomy      as a child   . Colonoscopy    . Cardiac catheterization    . Abdominal aortic aneurysm repair      EVAR   . Pacemaker insertion  07-16-12  . Umbilical hernia repair N/A 05/14/2013    Procedure: LAPAROSCOPIC exploration and repair of hernia in abdominal ;  Surgeon: Adin Hector, MD;  Location: [place];  Service: General;  Laterality: N/A;  . Insertion of mesh N/A 05/14/2013    Procedure: INSERTION OF MESH;  Surgeon: Adin Hector, MD;  Location: [facility];  Service: General;  Laterality: N/A;   Family History  Problem Relation Age of Onset  . Hypertension Mother   . Stroke Mother   . Diabetes Sister   . Heart disease Sister   . Hypertension Sister   . Colon cancer Neg Hx   . Prostate cancer Neg Hx   . Heart disease Father    Social History:  Married. Retired Personal assistant. Independent and goes to gym 6 days/week. Wife works days. He reports that he quit smoking about 9 years ago. His smoking use included Cigarettes. He has a 80 pack-year smoking history. He has never used smokeless tobacco. He reports that he drinks about 1.2 ounces of alcohol  per week. He reports that he does not use illicit drugs.   Allergies  Allergen Reactions  . Aldactone [Spironolactone] Other (See Comments)    Hyperkalemia  . Codeine Hives   Medications Prior to Admission  Medication Sig Dispense Refill  . aspirin EC 81 MG tablet Take 81 mg by mouth daily.      . carvedilol (COREG) 25 MG tablet Take 1 tablet (25 mg total) by mouth 2 (two) times daily with a meal.  60 tablet  9  . glipiZIDE (GLUCOTROL) 5 MG tablet Take 5 mg by mouth 2 (two) times daily before a meal.      . lisinopril (PRINIVIL,ZESTRIL) 20 MG tablet Take 1 tablet (20 mg total) by mouth 2 (two) times daily.  180 tablet  3  . metFORMIN (GLUCOPHAGE) 500 MG tablet Take 1,000 mg by mouth 2 (two) times daily with a meal.      . Multiple Vitamin (MULTIVITAMIN) tablet Take 1 tablet by mouth daily.       . sildenafil (VIAGRA) 50 MG tablet Take 50 mg by mouth daily as needed for erectile dysfunction.      . simvastatin (ZOCOR) 40 MG tablet Take 40 mg by mouth at bedtime.        Home: Home Living Family/patient expects to be discharged to:: Private residence Living Arrangements: Spouse/significant other Additional Comments: Pts wife works full time.  Functional History: Prior Function Comments: Pt states he exercises 6 days a week Functional Status:  Mobility:          ADL: ADL Eating/Feeding: Performed;Minimal assistance;Other (comment) (drops utenisils, squeezes to hard on cup.) Where Assessed - Eating/Feeding: Chair Grooming: Performed;Wash/dry hands;Wash/dry face;Teeth care;Brushing hair;Minimal assistance Where Assessed - Grooming: Unsupported sitting Upper Body Bathing: Simulated;Minimal assistance Where Assessed - Upper Body Bathing: Supported sitting Lower Body Bathing: Simulated;Minimal assistance Where Assessed - Lower Body Bathing: Supported sit to stand Upper Body Dressing: Simulated;Minimal assistance Where Assessed - Upper Body Dressing: Supported sitting Lower Body  Dressing: Performed;Minimal assistance Where Assessed - Lower Body Dressing: Supported sit to Lobbyist: Performed;Minimal assistance Toilet Transfer Method: Stand pivot;Sit to stand Toilet Transfer Equipment: Comfort height toilet;Grab bars Equipment Used: Rolling walker;Other (comment) (built up handles for utensils and toothbrush) Transfers/Ambulation Related to ADLs: Pt required min assist for transfers.  Pt unsteady on his feet walked in room with uncoordiated gait pattern.  Pt is a fall risk when up.  R hand slips off walker due to pt not feeling walker in his hand. ADL Comments: Pt has a great amount of difficulty with all fine motor tasks.  Pt drops utensils while eating, is unable to cut food, has difficulty drinking from cup but is able to if there is a lid.  Pt has  difficulty with all fasteners, tying shoes, zipping pants, buttoning pants and keeping his balance while doing adls in standing.  Pt unable to write at this time.  Cognition: Cognition Overall Cognitive Status: Within Functional Limits for tasks assessed Orientation Level: Oriented X4 Cognition Arousal/Alertness: Awake/alert Behavior During Therapy: WFL for tasks assessed/performed Overall Cognitive Status: Within Functional Limits for tasks assessed  Blood pressure 151/83, pulse 77, temperature 97.8 F (36.6 C), temperature source Oral, resp. rate 18, SpO2 94.00%. Physical Exam  Nursing note and vitals reviewed. Constitutional: He appears well-developed and well-nourished.  HENT:  Head: Normocephalic and atraumatic.  Eyes: Conjunctivae are normal. Pupils are equal, round, and reactive to light.  Neck:  Immobilized by cervical collar.   Cardiovascular: Normal rate and regular rhythm.   Respiratory: Effort normal and breath sounds normal. No respiratory distress. He has no wheezes.  GI: Soft. Bowel sounds are normal. He exhibits no distension. There is no tenderness.  Musculoskeletal: He exhibits no  edema and no tenderness.  Neurological: He is alert.  RUE ataxic with finger to nose testing. Right pronator drift. Right deltoid 4-, bicep 4/5. tricep 4. HI and wrist 4. LUE 5/5. Bilateral LE's 5/5. Decreased H to S right LE. Sensation decreased to PP and LT right > LEFT  Psychiatric: He has a normal mood and affect. His behavior is normal. Judgment and thought content normal.    Results for orders placed during the hospital encounter of 06/24/13 (from the past 24 hour(s))  CBG MONITORING, ED     Status: Abnormal   Collection Time    06/24/13  9:34 PM      Result Value Ref Range   Glucose-Capillary 172 (*) 70 - 99 mg/dL  CBG MONITORING, ED     Status: Abnormal   Collection Time    06/24/13 10:21 PM      Result Value Ref Range   Glucose-Capillary 183 (*) 70 - 99 mg/dL  CBC     Status: Abnormal   Collection Time    06/24/13 10:55 PM      Result Value Ref Range   WBC 15.8 (*) 4.0 - 10.5 K/uL   RBC 4.38  4.22 - 5.81 MIL/uL   Hemoglobin 14.7  13.0 - 17.0 g/dL   HCT 40.6  39.0 - 52.0 %   MCV 92.7  78.0 - 100.0 fL   MCH 33.6  26.0 - 34.0 pg   MCHC 36.2 (*) 30.0 - 36.0 g/dL   RDW 12.6  11.5 - 15.5 %   Platelets 236  150 - 400 K/uL  BASIC METABOLIC PANEL     Status: Abnormal   Collection Time    06/24/13 10:55 PM      Result Value Ref Range   Sodium 138  137 - 147 mEq/L   Potassium 4.6  3.7 - 5.3 mEq/L   Chloride 99  96 - 112 mEq/L   CO2 20  19 - 32 mEq/L   Glucose, Bld 183 (*) 70 - 99 mg/dL   BUN 33 (*) 6 - 23 mg/dL   Creatinine, Ser 1.16  0.50 - 1.35 mg/dL   Calcium 9.7  8.4 - 10.5 mg/dL   GFR calc non Af Amer 64 (*) >90 mL/min   GFR calc Af Amer 74 (*) >90 mL/min   Ct Cervical Spine Wo Contrast  06/24/2013   CLINICAL DATA:  Slipped on ice while walking his dog. Generalized numbness and tingling with right upper extremity pain.  EXAM: CT CERVICAL SPINE WITHOUT CONTRAST  TECHNIQUE: Multidetector CT imaging of the cervical spine was performed without intravenous contrast.  Multiplanar CT image reconstructions were also generated.  COMPARISON:  None.  FINDINGS: No acute fractures involving the cervical spine. Severe long segment of ossification of the posterior longitudinal ligament extending from the mid dens to the upper endplate of C4. As a result, there is moderate spinal stenosis at C2-3 and severe spinal stenosis at the C3-4 level. Sagittal reconstructed images demonstrate anatomic alignment of the cervical spine. Moderate disc space narrowing at C5-6 and mild disc space narrowing at C6-7. Coronal reformatted images demonstrate an intact craniocervical junction, intact C1-C2 articulation, intact dens, and intact lateral masses throughout. Uncinate hypertrophy accounts for multilevel foraminal stenoses including mild right C3-4, severe right C4-5, moderate bilateral C5-6, and moderate bilateral C6-7.  IMPRESSION: 1. No cervical spine fractures identified. 2. Severe long segment ossification of the posterior longitudinal ligament extending from the mid dens to the upper endplate of C4. As a result, there is moderate spinal stenosis at C2-3 and severe spinal stenosis at the C3-4 level. 3. Given the patient's history of generalized numbness, cord contusion is suspected. Unenhanced MRI of the cervical spine may be helpful for confirmation. 4. Degenerative changes and multilevel foraminal stenoses as detailed above.   Electronically Signed   By: Evangeline Dakin M.D.   On: 06/24/2013 20:07    Assessment/Plan: Diagnosis: cervical whiplash injury with central cord syndrome, stenosis 1. Does the need for close, 24 hr/day medical supervision in concert with the patient's rehab needs make it unreasonable for this patient to be served in a less intensive setting? No and Potentially 2. Co-Morbidities requiring supervision/potential complications: htn, cad 3. Due to bladder management, bowel management, safety, skin/wound care, disease management and medication administration, does the  patient require 24 hr/day rehab nursing? No and Potentially 4. Does the patient require coordinated care of a physician, rehab nurse, PT, OT to address physical and functional deficits in the context of the above medical diagnosis(es)? Potentially Addressing deficits in the following areas: balance, endurance, locomotion, strength, transferring, bowel/bladder control, bathing, dressing and feeding 5. Can the patient actively participate in an intensive therapy program of at least 3 hrs of therapy per day at least 5 days per week? Potentially 6. The potential for patient to make measurable gains while on inpatient rehab is fair 7. Anticipated functional outcomes upon discharge from inpatient rehab are TBD with PT, TBD with OT, n/a with SLP. 8. Estimated rehab length of stay to reach the above functional goals is: TBD 9. Does the patient have adequate social supports to accommodate these discharge functional goals? Potentially 10. Anticipated D/C setting: Home 11. Anticipated post D/C treatments: [place] therapy 12. Overall Rehab/Functional Prognosis: good  RECOMMENDATIONS: This patient's condition is appropriate for continued rehabilitative care in the following setting: [facility] OT and PT Patient has agreed to participate in recommended program. Yes Note that insurance prior authorization may be required for reimbursement for recommended care.  Comment: Pt injured yesterday and is already at a min assist level. Should continue to show improvement and be ready to go home with [facility] therapies vs outpt by the beginning of the week. Rehab Admissions Coordinator to follow up.  Thanks,  Meredith Staggers, MD, Mellody Drown     06/25/2013

## 2013-06-25 NOTE — Evaluation (Signed)
Physical Therapy Evaluation Patient Details Name: Luke [name] MRN: [id number] DOB: 1946-01-21 Today's Date: 06/25/2013 Time: 3086-5784 PT Time Calculation (min): 25 min  PT Assessment / Plan / Recommendation History of Present Illness  Pt is a 68 yo male who fell on the ice yesterday and suffered immediate paralysis that has resolved somewhat and is now suffering a Central Cord Syndrome.  Pts R side is affected more than the L side.  For now, surgery will be delayed since pt's deficits are improving but pt may be a candidate for a future C2-C5 decompression and fusion.  EF 30%  Clinical Impression  This patient presents with acute pain and decreased functional independence following the above mentioned procedure. At the time of PT eval, pt showed good strength in upper and lower extremities, however gait is ataxic with difficulty holding onto the walker with R hand. Feel this patient would benefit from a short inpatient rehab stay to try to achieve a mod I level. Pt is at an increased fall risk to d/c home at this time.    PT Assessment  Patient needs continued PT services    Follow Up Recommendations  CIR    Does the patient have the potential to tolerate intense rehabilitation      Barriers to Discharge        Equipment Recommendations  Rolling walker with 5" wheels;3in1 (PT)    Recommendations for Other Services Rehab consult   Frequency Min 4X/week    Precautions / Restrictions Precautions Precautions: Fall;Cervical Precaution Comments: Pt with collar to wear at all times. Pt is a fall risk due to proprioceptive deficits. Required Braces or Orthoses: Cervical Brace Cervical Brace: Hard collar;At all times Restrictions Weight Bearing Restrictions: No   Pertinent Vitals/Pain Pt reports "pins and needles" type of pain during ambulation in hands and feet.       Mobility  Bed Mobility Overal bed mobility: Needs Assistance Bed Mobility: Supine to Sit Supine to sit: Min  guard;HOB elevated General bed mobility comments: Pt was able to transition to EOB with min guard and cues for safety. HOB slightly elevated, and pt used bed rail for support.  Transfers Overall transfer level: Needs assistance Equipment used: Rolling walker (2 wheeled) Transfers: Sit to/from Stand Sit to Stand: Min assist;Min guard General transfer comment: Min assist initially progressing to min guard by end of session. As pt pushing up from bed/chair, shoulders/elbows extend and increased time needed for pt to grab the walker. Pt cued to look at R hand to ensure proper placement.  Ambulation/Gait Ambulation/Gait assistance: Min assist Ambulation Distance (Feet): 100 Feet Assistive device: Rolling walker (2 wheeled) Gait Pattern/deviations: Step-through pattern;Decreased stride length;Steppage;Ataxic Gait velocity: Slightly decreased Gait velocity interpretation: Below normal speed for age/gender General Gait Details: Pt with steppage gait pattern and appears slightly ataxic as well. Pt with history of diabetic neuropathy on bottoms of feet however pt states this is not his baseline regarding ambulation. Cueing required for R hand placement on walker as it was not planted properly on pad of walker.     Exercises Hand Exercises Digit Composite Flexion: AROM;Strengthening;Both;10 reps;Seated Composite Extension: AROM;Strengthening;Both;10 reps;Seated Digit Composite Abduction: AROM;Strengthening;Both;10 reps;Seated Digit Composite Adduction: AROM;Strengthening;Both;10 reps;Seated Digit Lifts: AROM;Both;10 reps;Seated Opposition: AROM;Both;10 reps;Seated   PT Diagnosis: Difficulty walking;Abnormality of gait  PT Problem List: Decreased strength;Decreased range of motion;Decreased activity tolerance;Decreased balance;Decreased mobility;Decreased knowledge of use of DME;Decreased coordination;Decreased safety awareness;Decreased knowledge of precautions;Pain PT Treatment Interventions: DME  instruction;Gait training;Stair training;Functional mobility training;Therapeutic activities;Therapeutic exercise;Neuromuscular  re-education;Patient/family education     PT Goals(Current goals can be found in the care plan section) Acute Rehab PT Goals Patient Stated Goal: to get my coordination back. PT Goal Formulation: With patient Time For Goal Achievement: 07/09/13 Potential to Achieve Goals: Good  Visit Information  Last PT Received On: 06/25/13 Assistance Needed: +2 (Safety ) PT/OT/SLP Co-Evaluation/Treatment: Yes Reason for Co-Treatment: Complexity of the patient's impairments (multi-system involvement) PT goals addressed during session: Mobility/safety with mobility;Proper use of DME;Strengthening/ROM OT goals addressed during session: Strengthening/ROM;Proper use of Adaptive equipment and DME;ADL's and self-care History of Present Illness: Pt is a 68 yo male who fell on the ice yesterday and suffered immediate paralysis that has resolved somewhat and is now suffering a Central Cord Syndrome.  Pts R side is affected more than the L side.  For now, surgery will be delayed since pt's deficits are improving but pt may be a candidate for a future C2-C5 decompression and fusion.  EF 30%       Prior [place] expects to be discharged to:: Private residence Living Arrangements: Spouse/significant other Additional Comments: Pts wife works full time. Prior Function Level of Independence: Independent Comments: Pt states he exercises 6 days a week Communication Communication: No difficulties Dominant Hand: Right    Cognition  Cognition Arousal/Alertness: Awake/alert Behavior During Therapy: WFL for tasks assessed/performed Overall Cognitive Status: Within Functional Limits for tasks assessed    Extremity/Trunk Assessment Upper Extremity Assessment Upper Extremity Assessment: RUE deficits/detail;LUE deficits/detail RUE Deficits / Details: Strength  is intact.  Pt with poor proprioception/sensation.  Coordiation poor. RUE Sensation: decreased proprioception;decreased light touch RUE Coordination: decreased fine motor;decreased gross motor LUE Deficits / Details: Pts L UE better than right but also has significant fine motor deficits/proprioceptive deficits.  Strength is intact. LUE Sensation: decreased light touch;decreased proprioception LUE Coordination: decreased fine motor;decreased gross motor Lower Extremity Assessment Lower Extremity Assessment: RLE deficits/detail;LLE deficits/detail RLE Sensation: decreased proprioception RLE Coordination: decreased gross motor LLE Deficits / Details: Pt with uncoordiated gait due to decreased proprioception.  Almost appears ataxic. LLE Sensation: decreased proprioception LLE Coordination: decreased gross motor Cervical / Trunk Assessment Cervical / Trunk Assessment: Other exceptions Cervical / Trunk Exceptions: hard collar donned throughout session   Balance Balance Overall balance assessment: Needs assistance Sitting-balance support: Bilateral upper extremity supported;Feet supported Sitting balance-Leahy Scale: Good Standing balance support: Bilateral upper extremity supported Standing balance-Leahy Scale: Poor Standing balance comment: Pt unable to keep his balance without outside support of walker and one person assist.  High Level Balance Comments: did not attempt any high level balance tasks as he has balance deficits just walking with walker. General Comments General comments (skin integrity, edema, etc.): pt lives with wife who works full time and will need 24 hour S upon d/c bc he is a fall risk. With neck in a brace and possible impending surgery, pt cannot afford a fall. Feel pt would benefit from rehab to possibly reach a mod I level where he could be home alone at times after rehab.  End of Session PT - End of Session Equipment Utilized During Treatment: Gait belt;Cervical  collar Activity Tolerance: Patient tolerated treatment well Patient left: in chair;with call bell/phone within reach;Other (comment) (OT present in room) Nurse Communication: Mobility status  GP     Jolyn Lent 06/25/2013, 12:35 PM  Jolyn Lent, [place], DPT [phone]

## 2013-06-25 NOTE — Progress Notes (Signed)
Utilization review completed.  

## 2013-06-25 NOTE — Evaluation (Signed)
Occupational Therapy Evaluation Patient Details Name: Luke [name] MRN: [id number] DOB: 02-Oct-1945 Today's Date: 06/25/2013 Time: [id number] OT Time Calculation (min): 60 min  OT Assessment / Plan / Recommendation History of present illness Pt is a 68 yo male who fell on the ice yesterday and suffered immediate paralysis that has resolved somewhat and is now suffering a Central Cord Syndrome.  Pts R side is affected more than the L side.  For now, surgery will be delayed since pt's deficits are improving but pt may be a candidate for a future C2-C5 decompression and fusion.  EF 30%   Clinical Impression   Pt admitted for the above diagnosis and has the deficits outlined below.  Pt would benefit from a short inpt rehab stay to attempt to reach a modified independent level of functioning.  Pt's wife works full time and it is felt that this pt could achieve mod I level with basic adls so he could d/c home and be alone at times when his wife is working.  At this point, pt would be a big fall risk if was d/c'd home alone and pt has great difficulties completing adls Ily with his UEs secondary to central cord syndrome.    OT Assessment  Patient needs continued OT Services    Follow Up Recommendations  CIR    Barriers to Discharge Decreased caregiver support wife works full time.  Some S available after d/c.  Equipment Recommendations  Tub/shower seat    Recommendations for Other Services Rehab consult  Frequency  Min 3X/week    Precautions / Restrictions Precautions Precautions: Fall;Cervical Precaution Comments: Pt with collar to wear at all times. Pt is a fall risk due to proprioceptive deficits. Required Braces or Orthoses: Cervical Brace Cervical Brace: Hard collar;At all times Restrictions Weight Bearing Restrictions: No   Pertinent Vitals/Pain Pt BP 151/86.  HR 77.  O2 sat 94% on RA.  Pain 4/10    ADL  Eating/Feeding: Performed;Minimal assistance;Other (comment) (drops  utenisils, squeezes to hard on cup.) Where Assessed - Eating/Feeding: Chair Grooming: Performed;Wash/dry hands;Wash/dry face;Teeth care;Brushing hair;Minimal assistance Where Assessed - Grooming: Unsupported sitting Upper Body Bathing: Simulated;Minimal assistance Where Assessed - Upper Body Bathing: Supported sitting Lower Body Bathing: Simulated;Minimal assistance Where Assessed - Lower Body Bathing: Supported sit to stand Upper Body Dressing: Simulated;Minimal assistance Where Assessed - Upper Body Dressing: Supported sitting Lower Body Dressing: Performed;Minimal assistance Where Assessed - Lower Body Dressing: Supported sit to Lobbyist: Performed;Minimal assistance Toilet Transfer Method: Stand pivot;Sit to Loss adjuster, chartered: Comfort height toilet;Grab bars Toileting - Clothing Manipulation and Hygiene: Performed;Minimal assistance Where Assessed - Best boy and Hygiene: Sit to stand from 3-in-1 or toilet Equipment Used: Rolling walker;Other (comment) (built up handles for utensils and toothbrush) Transfers/Ambulation Related to ADLs: Pt required min assist for transfers.  Pt unsteady on his feet walked in room with uncoordiated gait pattern.  Pt is a fall risk when up.  R hand slips off walker due to pt not feeling walker in his hand. ADL Comments: Pt has a great amount of difficulty with all fine motor tasks.  Pt drops utensils while eating, is unable to cut food, has difficulty drinking from cup but is able to if there is a lid.  Pt has difficulty with all fasteners, tying shoes, zipping pants, buttoning pants and keeping his balance while doing adls in standing.  Pt unable to write at this time.    OT Diagnosis: Generalized weakness;Paresis  OT Problem List:  Impaired balance (sitting and/or standing);Decreased coordination;Decreased knowledge of use of DME or AE;Impaired UE functional use;Pain OT Treatment Interventions: Self-care/ADL  training;Neuromuscular education;DME and/or AE instruction;Therapeutic activities;Balance training   OT Goals(Current goals can be found in the care plan section) Acute Rehab OT Goals Patient Stated Goal: to get my coordination back. OT Goal Formulation: With patient Time For Goal Achievement: 07/09/13 Potential to Achieve Goals: Good ADL Goals Pt Will Perform Eating: with modified independence;with adaptive utensils;sitting Pt Will Perform Grooming: with adaptive equipment;standing;with supervision Pt Will Perform Lower Body Bathing: with supervision;sit to/from stand Pt Will Perform Lower Body Dressing: with supervision;sit to/from stand Pt Will Transfer to Toilet: with supervision;ambulating;regular height toilet Pt Will Perform Toileting - Clothing Manipulation and hygiene: with supervision;sit to/from stand Pt Will Perform Tub/Shower Transfer: Shower transfer;with supervision;rolling walker;shower seat;ambulating Pt/caregiver will Perform Home Exercise Program: Both right and left upper extremity;With theraputty;With Supervision Additional ADL Goal #1: Pt will use RUE for all adls that are within safe environment without cues.  Visit Information  Last OT Received On: 06/25/13 Assistance Needed: +2 (Safety ) PT/OT/SLP Co-Evaluation/Treatment: Yes Reason for Co-Treatment: Complexity of the patient's impairments (multi-system involvement) PT goals addressed during session: Mobility/safety with mobility;Proper use of DME;Strengthening/ROM OT goals addressed during session: Strengthening/ROM;Proper use of Adaptive equipment and DME;ADL's and self-care History of Present Illness: Pt is a 68 yo male who fell on the ice yesterday and suffered immediate paralysis that has resolved somewhat and is now suffering a Central Cord Syndrome.  Pts R side is affected more than the L side.  For now, surgery will be delayed since pt's deficits are improving but pt may be a candidate for a future C2-C5  decompression and fusion.  EF 30%       Prior [place] expects to be discharged to:: Private residence Living Arrangements: Spouse/significant other Additional Comments: Pts wife works full time. Prior Function Level of Independence: Independent Comments: Pt states he exercises 6 days a week Communication Communication: No difficulties Dominant Hand: Right         Vision/Perception Vision - History Baseline Vision: No visual deficits Patient Visual Report: No change from baseline Vision - Assessment Eye Alignment: Within Functional Limits Vision Assessment: Vision not tested Perception Perception: Within Functional Limits Praxis Praxis: Intact   Cognition  Cognition Arousal/Alertness: Awake/alert Behavior During Therapy: WFL for tasks assessed/performed Overall Cognitive Status: Within Functional Limits for tasks assessed    Extremity/Trunk Assessment Upper Extremity Assessment Upper Extremity Assessment: RUE deficits/detail;LUE deficits/detail RUE Deficits / Details: Strength is intact.  Pt with poor proprioception/sensation.  Coordiation poor. RUE Sensation: decreased proprioception;decreased light touch (light touch not as affected as proprioception) RUE Coordination: decreased fine motor;decreased gross motor LUE Deficits / Details: Pts L UE better than right but also has significant fine motor deficits/proprioceptive deficits.  Strength is intact. LUE Sensation: decreased light touch;decreased proprioception LUE Coordination: decreased fine motor;decreased gross motor Lower Extremity Assessment Lower Extremity Assessment: RLE deficits/detail;LLE deficits/detail (5/5 strength in hips flexion and knee flex/ext) RLE Sensation: decreased proprioception RLE Coordination: decreased gross motor LLE Deficits / Details: Pt with uncoordiated gait due to decreased proprioception.  Almost appears ataxic. LLE Sensation: decreased  proprioception LLE Coordination: decreased gross motor Cervical / Trunk Assessment Cervical / Trunk Assessment: Other exceptions Cervical / Trunk Exceptions: hard collar donned throughout session     Mobility Bed Mobility Overal bed mobility: Needs Assistance Bed Mobility: Supine to Sit Supine to sit: Min guard;HOB elevated General bed mobility comments:  Pt was able to transition to EOB with min guard and cues for safety. HOB slightly elevated, and pt used bed rail for support.  Transfers Overall transfer level: Needs assistance Equipment used: Rolling walker (2 wheeled) Transfers: Sit to/from Stand Sit to Stand: Min assist;Min guard General transfer comment: Min assist initially progressing to min guard by end of session. As pt pushing up from bed/chair, shoulders/elbows extend and increased time needed for pt to grab the walker. OT cued     Exercise Hand Exercises Digit Composite Flexion: AROM;Strengthening;Both;10 reps;Seated Composite Extension: AROM;Strengthening;Both;10 reps;Seated Digit Composite Abduction: AROM;Strengthening;Both;10 reps;Seated Digit Composite Adduction: AROM;Strengthening;Both;10 reps;Seated Digit Lifts: AROM;Both;10 reps;Seated Opposition: AROM;Both;10 reps;Seated   Balance Balance Overall balance assessment: Needs assistance Sitting-balance support: Bilateral upper extremity supported;Feet supported Sitting balance-Leahy Scale: Good Standing balance support: Bilateral upper extremity supported Standing balance-Leahy Scale: Poor Standing balance comment: Pt unable to keep his balance without outside support of walker and one person assist. High Level Balance Comments: did not attempt any high level balance tasks as he has balance deficits just walking with walker. General Comments General comments (skin integrity, edema, etc.): pt lives with wife who works full time and will need 24 hour S upon d/c bc he is a fall risk.  With neck in a brace and  possible impending surgery, pt cannot afford a fall.  Feel pt would benefit from rehab to possibly reach a mod I level where he could be home alone at times after rehab.   End of Session OT - End of Session Equipment Utilized During Treatment: Rolling walker;Cervical collar Activity Tolerance: Patient tolerated treatment well Patient left: in chair;with call bell/phone within reach Nurse Communication: Mobility status  GO     Glenford Peers 06/25/2013, 10:16 AM [phone]

## 2013-06-25 NOTE — Progress Notes (Signed)
Rehab Admissions Coordinator Note:  Patient was screened by Cleatrice Burke for appropriateness for an Inpatient Acute Rehab Consult.  At this time, we are recommending Inpatient Rehab consult. I will order.  Cleatrice Burke 06/25/2013, 10:50 AM  I can be reached at [phone].

## 2013-06-25 NOTE — Progress Notes (Signed)
Patient ID: Luke [name], male   DOB: 02-28-1946, 68 y.o.   MRN: [id number] Patient continues to complain of some clumsiness and tingling in his arms and hands. He still has a little weakness on the right-hand side but feels like his strength is improving. He has ataxia with gait ataxia with his upper extremities. The therapist to recommending rehabilitation for him and I think this is extremely reasonable given his partial spinal cord injury and dysfunction related to that. He would likely be a fall risk and have trouble with ADLs going home since he does not have 24-hour care. His physical exam today is somewhat improved and that he has 4+ out of 5 grip strength, but still has 4/5 right extension, bicep, tricep, and deltoid strength. He is also ataxic in his movements when trying to hold onto a couple to pick up objects. We will await insurance clearance for rehabilitation. Continue cervical collar. No reason for early surgery here given his neurologic exam is improving. No need for steroid protocol given the fact that the risks are now felt to outweigh the potential benefits of the protocol.

## 2013-06-25 NOTE — Progress Notes (Signed)
Orthopedic Tech Progress Note Patient Details:  Luke [name] Sep 25, 1945 [id number] Spoke with patient's nurse, patient already has aspen collar. No action needed from Ortho Tech at this time. Patient ID: Luke [name], male   DOB: [birth year], 68 y.o.   MRN: [id number]   Fenton Foy 06/25/2013, 9:43 AM

## 2013-06-26 DIAGNOSIS — S14121A Central cord syndrome at C1 level of cervical spinal cord, initial encounter: Secondary | ICD-10-CM | POA: Diagnosis not present

## 2013-06-26 LAB — GLUCOSE, CAPILLARY
Glucose-Capillary: 129 mg/dL — ABNORMAL HIGH (ref 70–99)
Glucose-Capillary: 202 mg/dL — ABNORMAL HIGH (ref 70–99)
Glucose-Capillary: 183 mg/dL — ABNORMAL HIGH (ref 70–99)

## 2013-06-26 NOTE — Progress Notes (Signed)
Subjective: Patient reports making good progress "I'm not putting noodles in my ears any more"  Objective: Vital signs in last 24 hours: Temp:  [97.8 F (36.6 C)-98.7 F (37.1 C)] 98.7 F (37.1 C) (02/21 0622) Pulse Rate:  [80-85] 85 (02/21 0622) Resp:  [18] 18 (02/21 0622) BP: (158-171)/(84-85) 158/84 mmHg (02/21 0622) SpO2:  [93 %-99 %] 99 % (02/21 0622)  Intake/Output from previous day: 02/20 0701 - 02/21 0700 In: 2880 [P.O.:1080; I.V.:1800] Out: 1450 [Urine:1450] Intake/Output this shift: Total I/O In: 320 [P.O.:320] Out: 500 [Urine:500]  Physical Exam: Good strength both upper extremities.  Still complains of numbness in hands  Lab Results:  Recent Labs  06/24/13 2255  WBC 15.8*  HGB 14.7  HCT 40.6  PLT 236   BMET  Recent Labs  06/24/13 2255  NA 138  K 4.6  CL 99  CO2 20  GLUCOSE 183*  BUN 33*  CREATININE 1.16  CALCIUM 9.7    Studies/Results: Ct Cervical Spine Wo Contrast  06/24/2013   CLINICAL DATA:  Slipped on ice while walking his dog. Generalized numbness and tingling with right upper extremity pain.  EXAM: CT CERVICAL SPINE WITHOUT CONTRAST  TECHNIQUE: Multidetector CT imaging of the cervical spine was performed without intravenous contrast. Multiplanar CT image reconstructions were also generated.  COMPARISON:  None.  FINDINGS: No acute fractures involving the cervical spine. Severe long segment of ossification of the posterior longitudinal ligament extending from the mid dens to the upper endplate of C4. As a result, there is moderate spinal stenosis at C2-3 and severe spinal stenosis at the C3-4 level. Sagittal reconstructed images demonstrate anatomic alignment of the cervical spine. Moderate disc space narrowing at C5-6 and mild disc space narrowing at C6-7. Coronal reformatted images demonstrate an intact craniocervical junction, intact C1-C2 articulation, intact dens, and intact lateral masses throughout. Uncinate hypertrophy accounts for  multilevel foraminal stenoses including mild right C3-4, severe right C4-5, moderate bilateral C5-6, and moderate bilateral C6-7.  IMPRESSION: 1. No cervical spine fractures identified. 2. Severe long segment ossification of the posterior longitudinal ligament extending from the mid dens to the upper endplate of C4. As a result, there is moderate spinal stenosis at C2-3 and severe spinal stenosis at the C3-4 level. 3. Given the patient's history of generalized numbness, cord contusion is suspected. Unenhanced MRI of the cervical spine may be helpful for confirmation. 4. Degenerative changes and multilevel foraminal stenoses as detailed above.   Electronically Signed   By: Evangeline Dakin M.D.   On: 06/24/2013 20:07    Assessment/Plan: Continued progress.  Dr.  Tessa Lerner to reassess Monday, but will likely be able to go home with Home Health services at that time.    LOS: 2 days    Peggyann Shoals, MD 06/26/2013, 11:35 AM

## 2013-06-27 DIAGNOSIS — S14121A Central cord syndrome at C1 level of cervical spinal cord, initial encounter: Secondary | ICD-10-CM | POA: Diagnosis not present

## 2013-06-27 LAB — GLUCOSE, CAPILLARY
Glucose-Capillary: 108 mg/dL — ABNORMAL HIGH (ref 70–99)
Glucose-Capillary: 142 mg/dL — ABNORMAL HIGH (ref 70–99)
Glucose-Capillary: 190 mg/dL — ABNORMAL HIGH (ref 70–99)
Glucose-Capillary: 151 mg/dL — ABNORMAL HIGH (ref 70–99)
Glucose-Capillary: 168 mg/dL — ABNORMAL HIGH (ref 70–99)

## 2013-06-27 NOTE — Progress Notes (Signed)
Physical Therapy Treatment Patient Details Name: Luke [name] MRN: [id number] DOB: 11-06-1945 Today's Date: 06/27/2013 Time: 3086-5784 PT Time Calculation (min): 13 min  PT Assessment / Plan / Recommendation  History of Present Illness Pt is a 68 yo male who fell on the ice yesterday and suffered immediate paralysis that has resolved somewhat and is now suffering a Central Cord Syndrome.  Pts R side is affected more than the L side.  For now, surgery will be delayed since pt's deficits are improving but pt may be a candidate for a future C2-C5 decompression and fusion.  EF 30%   PT Comments   Noting quite good improvements in gait coordination, with gait pattern approaching normal -- though still quite dependent on UE support on RW  Follow Up Recommendations  Home health PT Showing enough improvements in mobility to consider dc home     Does the patient have the potential to tolerate intense rehabilitation     Barriers to Discharge        Equipment Recommendations  Rolling walker with 5" wheels;3in1 (PT)    Recommendations for Other Services    Frequency Min 4X/week   Progress towards PT Goals Progress towards PT goals: Progressing toward goals  Plan Discharge plan needs to be updated    Precautions / Restrictions Precautions Precautions: Fall;Cervical Precaution Comments: Pt with collar to wear at all times. Pt is a fall risk due to proprioceptive deficits. Required Braces or Orthoses: Cervical Brace Cervical Brace: Hard collar;At all times   Pertinent Vitals/Pain no apparent distress     Mobility  Bed Mobility Overal bed mobility: Needs Assistance Bed Mobility: Sit to Supine Sit to supine: Min guard General bed mobility comments: Cues for technique Transfers Overall transfer level: Needs assistance Equipment used: Rolling walker (2 wheeled) Transfers: Sit to/from Stand Sit to Stand: Min assist General transfer comment: Cues for hand placement, safety, and  technique; Noted dependent on UE support on RW to achieve standing; Practiced from low bench Ambulation/Gait Ambulation/Gait assistance: Min guard Ambulation Distance (Feet): 200 Feet Assistive device: Rolling walker (2 wheeled) Gait Pattern/deviations: Step-through pattern Gait velocity: Slightly decreased General Gait Details: No steppage gait and noted much improved coordination; minimal ataxia noted    Exercises     PT Diagnosis:    PT Problem List:   PT Treatment Interventions:     PT Goals (current goals can now be found in the care plan section) Acute Rehab PT Goals Patient Stated Goal: to get my coordination back. PT Goal Formulation: With patient Time For Goal Achievement: 07/09/13 Potential to Achieve Goals: Good  Visit Information  Last PT Received On: 06/27/13 Assistance Needed: +1 History of Present Illness: Pt is a 68 yo male who fell on the ice yesterday and suffered immediate paralysis that has resolved somewhat and is now suffering a Central Cord Syndrome.  Pts R side is affected more than the L side.  For now, surgery will be delayed since pt's deficits are improving but pt may be a candidate for a future C2-C5 decompression and fusion.  EF 30%    Subjective Data  Subjective: Reports is getting better; he feels like his walking is much improved, "I didn't have to step so high" Patient Stated Goal: to get my coordination back.   Cognition  Cognition Arousal/Alertness: Awake/alert Behavior During Therapy: WFL for tasks assessed/performed Overall Cognitive Status: Within Functional Limits for tasks assessed    Balance  Balance Overall balance assessment: Needs assistance Sitting-balance support: No upper  extremity supported Sitting balance-Leahy Scale: Good Standing balance support: Bilateral upper extremity supported Standing balance-Leahy Scale: Poor Standing balance comment: Continues to require bil UE suport for balance while standing  End of Session PT  - End of Session Equipment Utilized During Treatment: Cervical collar Activity Tolerance: Patient tolerated treatment well Patient left: in bed;with call bell/phone within reach Nurse Communication: Mobility status   GP     Goff, [place], [place]  06/27/2013, 1:40 PM

## 2013-06-27 NOTE — Progress Notes (Signed)
Subjective: Patient reports continuing to make progress.  Dexterity improving, still has some numbness  Objective: Vital signs in last 24 hours: Temp:  [98 F (36.7 C)-98.4 F (36.9 C)] 98.1 F (36.7 C) (02/22 0433) Pulse Rate:  [75-78] 75 (02/22 0859) Resp:  [20] 20 (02/22 0433) BP: (176-190)/(81-91) 176/81 mmHg (02/22 0859) SpO2:  [96 %-100 %] 100 % (02/22 0433)  Intake/Output from previous day: 02/21 0701 - 02/22 0700 In: 965 [P.O.:440; I.V.:525] Out: 1100 [Urine:1100] Intake/Output this shift: Total I/O In: 560 [P.O.:560] Out: -   Physical Exam: Strength much improved in both hands  Lab Results:  Recent Labs  06/24/13 2255  WBC 15.8*  HGB 14.7  HCT 40.6  PLT 236   BMET  Recent Labs  06/24/13 2255  NA 138  K 4.6  CL 99  CO2 20  GLUCOSE 183*  BUN 33*  CREATININE 1.16  CALCIUM 9.7    Studies/Results: No results found.  Assessment/Plan: Anticipate D/C in am after evaluation with Dr. Tessa Lerner.  To have decompressive surgery by Dr. Ronnald Ramp in the next 3 - 4 weeks per their mutual convenience.    LOS: 3 days    Peggyann Shoals, MD 06/27/2013, 12:10 PM

## 2013-06-28 ENCOUNTER — Inpatient Hospital Stay (HOSPITAL_COMMUNITY): Payer: Medicare Other | Admitting: Occupational Therapy

## 2013-06-28 ENCOUNTER — Encounter (HOSPITAL_COMMUNITY): Payer: Medicare Other | Admitting: Occupational Therapy

## 2013-06-28 ENCOUNTER — Inpatient Hospital Stay (HOSPITAL_COMMUNITY): Payer: Medicare Other

## 2013-06-28 DIAGNOSIS — M542 Cervicalgia: Secondary | ICD-10-CM | POA: Diagnosis not present

## 2013-06-28 LAB — GLUCOSE, CAPILLARY
Glucose-Capillary: 167 mg/dL — ABNORMAL HIGH (ref 70–99)
Glucose-Capillary: 207 mg/dL — ABNORMAL HIGH (ref 70–99)
Glucose-Capillary: 97 mg/dL (ref 70–99)
Glucose-Capillary: 115 mg/dL — ABNORMAL HIGH (ref 70–99)

## 2013-06-28 MED ORDER — INSULIN ASPART 100 UNIT/ML ~~LOC~~ SOLN
0.0000 [IU] | Freq: Three times a day (TID) | SUBCUTANEOUS | Status: DC
  Administered 2013-06-28: 3 [IU] via SUBCUTANEOUS
  Administered 2013-06-29: 2 [IU] via SUBCUTANEOUS

## 2013-06-28 NOTE — Progress Notes (Signed)
Physical Therapy Treatment Patient Details Name: Luke [name] MRN: [id number] DOB: Oct 21, 1945 Today's Date: 06/28/2013 Time: 1003-1030 PT Time Calculation (min): 27 min  PT Assessment / Plan / Recommendation  History of Present Illness Pt is a 68 yo male who fell on the ice yesterday and suffered immediate paralysis that has resolved somewhat and is now suffering a Central Cord Syndrome.  Pts R side is affected more than the L side.  For now, surgery will be delayed since pt's deficits are improving but pt may be a candidate for a future C2-C5 decompression and fusion.  EF 30%   PT Comments   Pt/wife performed education on stairs, shower seat and donning/doffing collar for changing pads. Pt/wife report they feel comfortable with this at home.  Pt/wife agree with recommendation for HHPT at d/c.  Follow Up Recommendations  Home health PT     Does the patient have the potential to tolerate intense rehabilitation     Barriers to Discharge        Equipment Recommendations  Rolling walker with 5" wheels (shower seat)    Recommendations for Other Services    Frequency Min 4X/week   Progress towards PT Goals Progress towards PT goals: Progressing toward goals  Plan Current plan remains appropriate    Precautions / Restrictions Precautions Precautions: Fall;Cervical Precaution Comments: Pt with collar to wear at all times. Pt is a fall risk due to proprioceptive deficits. Required Braces or Orthoses: Cervical Brace Cervical Brace: Hard collar;At all times Restrictions Weight Bearing Restrictions: No   Pertinent Vitals/Pain No c/o pain, pt reports increased feeling in hands and feet    Mobility  Bed Mobility Sit to supine: Supervision General bed mobility comments: Cues for technique Transfers Equipment used: Rolling walker (2 wheeled) Sit to Stand: Supervision General transfer comment: pt able to stand from various surfaces with supervision.  Practiced shower seat transfers  with pt requiring cues for safety to sit on seat before lifting legs into tub, pt's wife present and able to provide cues.  Emphasized safety with pt to prevent falls and injury, he reports understanding.  Pt/wife feel comfortable with shower seat transfers Ambulation/Gait Ambulation/Gait assistance: Supervision Ambulation Distance (Feet): 200 Feet Assistive device: Rolling walker (2 wheeled) Gait velocity: Slightly decreased General Gait Details: mild ataxia, no LOB Stairs: Yes Stairs assistance: Supervision Stair Management: Two rails Number of Stairs: 5 General stair comments: 5 stairs x 2 with B handrails, pt's wife observing and understands she must manage RW while pt negotiates stairs.    Exercises     PT Diagnosis:    PT Problem List:   PT Treatment Interventions:     PT Goals (current goals can now be found in the care plan section)    Visit Information  Last PT Received On: 06/28/13 Assistance Needed: +1 History of Present Illness: Pt is a 68 yo male who fell on the ice yesterday and suffered immediate paralysis that has resolved somewhat and is now suffering a Central Cord Syndrome.  Pts R side is affected more than the L side.  For now, surgery will be delayed since pt's deficits are improving but pt may be a candidate for a future C2-C5 decompression and fusion.  EF 30%    Subjective Data      Cognition  Cognition Arousal/Alertness: Awake/alert Behavior During Therapy: WFL for tasks assessed/performed Overall Cognitive Status: Within Functional Limits for tasks assessed    Balance  General Comments General comments (skin integrity, edema, etc.): pt's wife  educated on and performed safely donning/doffing cervical collar to change pads.  pt's RN aware pt needs clean pads before discharge  End of Session PT - End of Session Equipment Utilized During Treatment: Cervical collar Activity Tolerance: Patient tolerated treatment well Patient left: in bed;with call  bell/phone within reach;with family/visitor present Nurse Communication: Mobility status   GP     Deina Lipsey 06/28/2013, 11:22 AM

## 2013-06-28 NOTE — Progress Notes (Signed)
Patient ID: Luke [name], male   DOB: 1945-12-18, 68 y.o.   MRN: [id number] Subjective: Patient reports resolution of his tingling, though he still has some numbness in his hands. He feels like he is walking better. Somewhat sore.  strength is improving slowly.  Objective: Vital signs in last 24 hours: Temp:  [97.9 F (36.6 C)-98 F (36.7 C)] 98 F (36.7 C) (02/23 0449) Pulse Rate:  [68-79] 72 (02/23 0449) Resp:  [20] 20 (02/23 0449) BP: (140-184)/(70-91) 140/70 mmHg (02/23 0449) SpO2:  [95 %-98 %] 95 % (02/23 0449)  Intake/Output from previous day: 02/22 0701 - 02/23 0700 In: 920 [P.O.:920] Out: -  Intake/Output this shift:    Neurologic: Mental status: Alert, oriented, thought content appropriate Motor: Good grip strength but still somewhat apraxic or ataxic Coordination: Somewhat clumsy greater than left Gait: Antalgic  Lab Results: Lab Results  Component Value Date   WBC 15.8* 06/24/2013   HGB 14.7 06/24/2013   HCT 40.6 06/24/2013   MCV 92.7 06/24/2013   PLT 236 06/24/2013   Lab Results  Component Value Date   INR 1.15 12/03/2011   BMET Lab Results  Component Value Date   NA 138 06/24/2013   K 4.6 06/24/2013   CL 99 06/24/2013   CO2 20 06/24/2013   GLUCOSE 183* 06/24/2013   BUN 33* 06/24/2013   CREATININE 1.16 06/24/2013   CALCIUM 9.7 06/24/2013    Studies/Results: No results found.  Assessment/Plan: Continues to improve. Question is whether or not he needs home health physical therapy or short stay in rehabilitation. It appears most are saying he could go home with home health. We'll continue therapy today and potentially discharge tomorrow and less rehabilitation feels he is a better rehabilitation candidate.   LOS: 4 days    JONES,DAVID S 06/28/2013, 7:59 AM

## 2013-06-28 NOTE — Progress Notes (Signed)
Occupational Therapy Treatment Patient Details Name: Luke [name] MRN: [id number] DOB: 1945/09/30 Today's Date: 06/28/2013 Time: 1115-1200 OT Time Calculation (min): 45 min  OT Assessment / Plan / Recommendation  History of present illness Pt is a 68 yo male who fell on the ice yesterday and suffered immediate paralysis that has resolved somewhat and is now suffering a Central Cord Syndrome.  Pts R side is affected more than the L side.  For now, surgery will be delayed since pt's deficits are improving but pt may be a candidate for a future C2-C5 decompression and fusion.  EF 30%   OT comments  Pt. Is improving with fine and gross motor of B UE. Pt. Performed fine motor exercises with use of coins and discussed hand out for other activities to be performed at home. Pt. Is doing well with using built up foam for [facility] exercises. Discussed with pt. And wife to either take up rugs or tape down with 2 sided tape. Discussed removing obstacles in home to make for safer d/c and decrease risk of falls.   Follow Up Recommendations       Barriers to Discharge       Equipment Recommendations       Recommendations for Other Services    Frequency     Progress towards OT Goals Progress towards OT goals: Progressing toward goals  Plan Discharge plan remains appropriate    Precautions / Restrictions Precautions Precautions: Fall;Cervical Precaution Comments: Pt with collar to wear at all times. Pt is a fall risk due to proprioceptive deficits. Required Braces or Orthoses: Cervical Brace Cervical Brace: Hard collar;At all times Restrictions Weight Bearing Restrictions: No   Pertinent Vitals/Pain No c/o    ADL  Eating/Feeding: Performed;Set up Where Assessed - Eating/Feeding: Edge of bed Grooming: Performed;Wash/dry hands;Wash/dry face;Teeth care;Brushing hair;Supervision/safety;Set up Where Assessed - Grooming: Unsupported standing Lower Body Dressing: Performed;Set  up;Supervision/safety Where Assessed - Lower Body Dressing: Unsupported sitting;Unsupported standing Toilet Transfer: Chartered loss adjuster Method: Arts development officer: Comfort height toilet;Grab bars Toileting - Water quality scientist and Hygiene: Performed;Set up Where Assessed - Toileting Clothing Manipulation and Hygiene: Standing ADL Comments: Pt. was able to perform grooming and self feeding with use of built foam to assist with holding small objects. Pt. is better with fine and gross motor tasks.     OT Diagnosis:    OT Problem List:   OT Treatment Interventions:     OT Goals(current goals can now be found in the care plan section)    Visit Information  Last OT Received On: 06/28/13 Assistance Needed: +1 History of Present Illness: Pt is a 68 yo male who fell on the ice yesterday and suffered immediate paralysis that has resolved somewhat and is now suffering a Central Cord Syndrome.  Pts R side is affected more than the L side.  For now, surgery will be delayed since pt's deficits are improving but pt may be a candidate for a future C2-C5 decompression and fusion.  EF 30%    Subjective Data      Prior Functioning       Cognition  Cognition Arousal/Alertness: Awake/alert Behavior During Therapy: WFL for tasks assessed/performed Overall Cognitive Status: Within Functional Limits for tasks assessed    Mobility  Bed Mobility Sit to supine: Supervision General bed mobility comments: Cues for technique Transfers Equipment used: Rolling walker (2 wheeled) Sit to Stand: Supervision General transfer comment: pt able to stand from various surfaces with supervision.  Practiced shower seat  transfers with pt requiring cues for safety to sit on seat before lifting legs into tub, pt's wife present and able to provide cues.  Emphasized safety with pt to prevent falls and injury, he reports understanding.  Pt/wife feel comfortable with shower  seat transfers    Exercises      Balance General Comments General comments (skin integrity, edema, etc.): pt's wife educated on and performed safely donning/doffing cervical collar to change pads.  pt's RN aware pt needs clean pads before discharge  End of Session OT - End of Session Equipment Utilized During Treatment: Rolling walker;Cervical collar Activity Tolerance: Patient tolerated treatment well Patient left: in bed;with call bell/phone within reach;with family/visitor present  Benson 06/28/2013, 11:59 AM

## 2013-06-28 NOTE — Progress Notes (Signed)
Rehab admissions - Evaluated for possible admission.  Patient is doing well with therapies and should be able to discharge home with [facility] or outpatient therapies once he is medically ready.  Call me for questions.  [phone]

## 2013-06-29 DIAGNOSIS — S14121A Central cord syndrome at C1 level of cervical spinal cord, initial encounter: Secondary | ICD-10-CM | POA: Diagnosis not present

## 2013-06-29 LAB — GLUCOSE, CAPILLARY
Glucose-Capillary: 177 mg/dL — ABNORMAL HIGH (ref 70–99)
Glucose-Capillary: 210 mg/dL — ABNORMAL HIGH (ref 70–99)

## 2013-06-29 NOTE — Discharge Summary (Signed)
Physician Discharge Summary  Patient ID: Luke [name] MRN: [id number] DOB/AGE: March 21, 1946 68 y.o.  Admit date: 06/24/2013 Discharge date: 06/29/2013  Admission Diagnoses: Central cord syndrome   Discharge Diagnoses: Same   Discharged Condition: stable  Hospital Course: The patient was admitted on 06/24/2013 after a fall in which he suffered a mild central cord syndrome. He had numbness and tingling in his hands with mild weakness on his right side more than his left side. This caused clumsiness in his gait and with the use of his upper extremities. The hospital course was routine. The patient remained afebrile with stable vital signs, and tolerated a regular diet. The patient continued to increase activities physical and outpatient therapy, and pain was well controlled with oral pain medications. He fell he may be a candidate for rehabilitation but he made improvement to the point we felt he was safe for discharge to home with home health therapy. We plan to have him follow up in the office in 2 weeks to planning decompressive surgery.  Consults: rehabilitation medicine  Significant Diagnostic Studies:  Results for orders placed during the hospital encounter of 06/24/13  CBC      Result Value Ref Range   WBC 15.8 (*) 4.0 - 10.5 K/uL   RBC 4.38  4.22 - 5.81 MIL/uL   Hemoglobin 14.7  13.0 - 17.0 g/dL   HCT 40.6  39.0 - 52.0 %   MCV 92.7  78.0 - 100.0 fL   MCH 33.6  26.0 - 34.0 pg   MCHC 36.2 (*) 30.0 - 36.0 g/dL   RDW 12.6  11.5 - 15.5 %   Platelets 236  150 - 400 K/uL  BASIC METABOLIC PANEL      Result Value Ref Range   Sodium 138  137 - 147 mEq/L   Potassium 4.6  3.7 - 5.3 mEq/L   Chloride 99  96 - 112 mEq/L   CO2 20  19 - 32 mEq/L   Glucose, Bld 183 (*) 70 - 99 mg/dL   BUN 33 (*) 6 - 23 mg/dL   Creatinine, Ser 1.16  0.50 - 1.35 mg/dL   Calcium 9.7  8.4 - 10.5 mg/dL   GFR calc non Af Amer 64 (*) >90 mL/min   GFR calc Af Amer 74 (*) >90 mL/min  GLUCOSE, CAPILLARY   Result Value Ref Range   Glucose-Capillary 214 (*) 70 - 99 mg/dL   Comment 1 Notify RN     Comment 2 Documented in Chart    GLUCOSE, CAPILLARY      Result Value Ref Range   Glucose-Capillary 179 (*) 70 - 99 mg/dL  GLUCOSE, CAPILLARY      Result Value Ref Range   Glucose-Capillary 127 (*) 70 - 99 mg/dL  GLUCOSE, CAPILLARY      Result Value Ref Range   Glucose-Capillary 129 (*) 70 - 99 mg/dL  GLUCOSE, CAPILLARY      Result Value Ref Range   Glucose-Capillary 202 (*) 70 - 99 mg/dL   Comment 1 Notify RN    GLUCOSE, CAPILLARY      Result Value Ref Range   Glucose-Capillary 183 (*) 70 - 99 mg/dL  GLUCOSE, CAPILLARY      Result Value Ref Range   Glucose-Capillary 108 (*) 70 - 99 mg/dL  GLUCOSE, CAPILLARY      Result Value Ref Range   Glucose-Capillary 142 (*) 70 - 99 mg/dL  GLUCOSE, CAPILLARY      Result Value Ref Range   Glucose-Capillary  190 (*) 70 - 99 mg/dL  GLUCOSE, CAPILLARY      Result Value Ref Range   Glucose-Capillary 151 (*) 70 - 99 mg/dL  GLUCOSE, CAPILLARY      Result Value Ref Range   Glucose-Capillary 168 (*) 70 - 99 mg/dL  GLUCOSE, CAPILLARY      Result Value Ref Range   Glucose-Capillary 167 (*) 70 - 99 mg/dL  GLUCOSE, CAPILLARY      Result Value Ref Range   Glucose-Capillary 97  70 - 99 mg/dL   Comment 1 Notify RN     Comment 2 Documented in Chart    GLUCOSE, CAPILLARY      Result Value Ref Range   Glucose-Capillary 207 (*) 70 - 99 mg/dL   Comment 1 Notify RN     Comment 2 Documented in Chart    GLUCOSE, CAPILLARY      Result Value Ref Range   Glucose-Capillary 115 (*) 70 - 99 mg/dL  GLUCOSE, CAPILLARY      Result Value Ref Range   Glucose-Capillary 177 (*) 70 - 99 mg/dL  CBG MONITORING, ED      Result Value Ref Range   Glucose-Capillary 172 (*) 70 - 99 mg/dL  CBG MONITORING, ED      Result Value Ref Range   Glucose-Capillary 183 (*) 70 - 99 mg/dL    Ct Cervical Spine Wo Contrast  06/24/2013   CLINICAL DATA:  Slipped on ice while walking his  dog. Generalized numbness and tingling with right upper extremity pain.  EXAM: CT CERVICAL SPINE WITHOUT CONTRAST  TECHNIQUE: Multidetector CT imaging of the cervical spine was performed without intravenous contrast. Multiplanar CT image reconstructions were also generated.  COMPARISON:  None.  FINDINGS: No acute fractures involving the cervical spine. Severe long segment of ossification of the posterior longitudinal ligament extending from the mid dens to the upper endplate of C4. As a result, there is moderate spinal stenosis at C2-3 and severe spinal stenosis at the C3-4 level. Sagittal reconstructed images demonstrate anatomic alignment of the cervical spine. Moderate disc space narrowing at C5-6 and mild disc space narrowing at C6-7. Coronal reformatted images demonstrate an intact craniocervical junction, intact C1-C2 articulation, intact dens, and intact lateral masses throughout. Uncinate hypertrophy accounts for multilevel foraminal stenoses including mild right C3-4, severe right C4-5, moderate bilateral C5-6, and moderate bilateral C6-7.  IMPRESSION: 1. No cervical spine fractures identified. 2. Severe long segment ossification of the posterior longitudinal ligament extending from the mid dens to the upper endplate of C4. As a result, there is moderate spinal stenosis at C2-3 and severe spinal stenosis at the C3-4 level. 3. Given the patient's history of generalized numbness, cord contusion is suspected. Unenhanced MRI of the cervical spine may be helpful for confirmation. 4. Degenerative changes and multilevel foraminal stenoses as detailed above.   Electronically Signed   By: Evangeline Dakin M.D.   On: 06/24/2013 20:07    Antibiotics:  Anti-infectives   None      Discharge Exam: Blood pressure 156/85, pulse 62, temperature 98.6 F (37 C), temperature source Oral, resp. rate 18, height 6\' 3"  (1.905 m), weight 85.276 kg (188 lb), SpO2 99.00%. Neurologic: Mental status: Alert, oriented,  thought content appropriate Motor: Clumsiness on the right  more than the left but his strength is quite good Coordination: Some clumsiness Gait: Antalgic but steady a walker   Discharge Medications:     Medication List         aspirin EC 81  MG tablet  Take 81 mg by mouth daily.     carvedilol 25 MG tablet  Commonly known as:  COREG  Take 1 tablet (25 mg total) by mouth 2 (two) times daily with a meal.     glipiZIDE 5 MG tablet  Commonly known as:  GLUCOTROL  Take 5 mg by mouth 2 (two) times daily before a meal.     lisinopril 20 MG tablet  Commonly known as:  PRINIVIL,ZESTRIL  Take 1 tablet (20 mg total) by mouth 2 (two) times daily.     metFORMIN 500 MG tablet  Commonly known as:  GLUCOPHAGE  Take 1,000 mg by mouth 2 (two) times daily with a meal.     multivitamin tablet  Take 1 tablet by mouth daily.     sildenafil 50 MG tablet  Commonly known as:  VIAGRA  Take 50 mg by mouth daily as needed for erectile dysfunction.     simvastatin 40 MG tablet  Commonly known as:  ZOCOR  Take 40 mg by mouth at bedtime.        Disposition: home   Final Dx: Central cord syndrome      Discharge Orders   Future Appointments Provider Department Dept Phone   07/22/2013 9:50 AM [facility] [place] [phone]   09/13/2013 8:30 AM [facility] [place] [facility] [place] [phone]   09/15/2013 2:30 PM Tonia Ghent, MD [place] at [place] [phone]   01/05/2014 8:30 AM [facility] [phone]   Eat a light meal the night before the exam Nothing to eat or drink for at least 8 hours before exam No gum chewing, or smoking the morning of the exam. Please take your morning medications with small sips of water, especially blood pressure medication *Very Important* Please wear 2 piece clothing   01/05/2014 9:00 AM Sharmon Leyden Nickel, NP [facility]  [phone]   Future Orders Complete By Expires   Diet - low sodium heart healthy  As directed    Discharge instructions  As directed    Comments:     No strenuous activity, keep collar on except for showers   Increase activity slowly  As directed       Follow-up Information   Follow up with Aissa Lisowski S, MD In 2 weeks.   Specialty:  Neurosurgery   Contact information:   [address] [place] [address] [phone]        Signed: Eustace Moore 06/29/2013, 8:00 AM

## 2013-06-29 NOTE — Care Management Note (Signed)
CARE MANAGEMENT NOTE 06/29/2013  Patient:  Luke [name], Luke [name]   Account Number:  [id number]  Date Initiated:  06/28/2013  Documentation initiated by:  Ricki Miller  Subjective/Objective Assessment:   68 yr old male s/p fall on ice with spinal cord syndrome.     Action/Plan:   Case manager spoke with patient concerning home health and DME needs at discharge. Choice offered. Referral called to [place].   Anticipated DC Date:  06/29/2013   Anticipated DC Plan:  [place] [facility]  CM consult      [facility] Choice  HOME HEALTH  DURABLE MEDICAL EQUIPMENT   Choice offered to / List presented to:  C-1 Patient   DME arranged  [facility] Moselle      DME agency  [place] arranged  [place].   Status of service:  Completed, signed off Medicare Important Message given?   (If response is "NO", the following Medicare IM given date fields will be blank) Date Medicare IM given:   Date Additional Medicare IM given:    Discharge Disposition:  [place]  Per UR Regulation:    If discussed at Long Length of Stay Meetings, dates discussed:

## 2013-06-29 NOTE — Progress Notes (Signed)
Physical Therapy Treatment Patient Details Name: Luke [name] MRN: [id number] DOB: Feb 15, 1946 Today's Date: 06/29/2013 Time: 0865-7846 PT Time Calculation (min): 20 min  PT Assessment / Plan / Recommendation  History of Present Illness Pt is a 68 yo male who fell on the ice  and suffered immediate paralysis that has resolved somewhat and is now suffering a Central Cord Syndrome.  Pts R side is affected more than the L side.  For now, surgery will be delayed since pt's deficits are improving but pt may be a candidate for a future C2-C5 decompression and fusion.  EF 30%   PT Comments   Pt doing well.  He is wearing cervical collar all the time. Ambulating with RW.  Focus on education with ADLs and mobilty to promote safety in home setting.  Pt planning for DC home today.  Follow Up Recommendations  Home health PT     Does the patient have the potential to tolerate intense rehabilitation     Barriers to Discharge        Equipment Recommendations  Rolling walker with 5" wheels    Recommendations for Other Services    Frequency Min 4X/week   Progress towards PT Goals Progress towards PT goals: Progressing toward goals  Plan Current plan remains appropriate    Precautions / Restrictions Precautions Precautions: Cervical;Fall Precaution Comments: Pt with collar to wear at all times. Pt is a fall risk due to proprioceptive deficits. Required Braces or Orthoses: Cervical Brace Cervical Brace: Hard collar;At all times Restrictions Weight Bearing Restrictions: No   Pertinent Vitals/Pain Pain in mid back and down right arm 2/10    Mobility  Bed Mobility Overal bed mobility: Independent Bed Mobility: Supine to Sit General bed mobility comments: Good technique with flat bed and no rail Transfers Overall transfer level: Needs assistance Equipment used: Rolling walker (2 wheeled) Transfers: Sit to/from [facility] Sit to Stand: Supervision Stand pivot transfers:  Supervision Ambulation/Gait Ambulation/Gait assistance: Supervision Ambulation Distance (Feet): 300 Feet Assistive device: Rolling walker (2 wheeled) Gait Pattern/deviations: Step-through pattern (cued on posture ) Stairs assistance:  (pt verbalized stair technque and pt didnt feel he needed to practice.  focus on safety ed)    Exercises     PT Diagnosis:    PT Problem List:   PT Treatment Interventions:     PT Goals (current goals can now be found in the care plan section)    Visit Information  Last PT Received On: 06/29/13 Assistance Needed: +1 PT goals addressed during session: Mobility/safety with mobility (following cervical precautions at home) History of Present Illness: Pt is a 68 yo male who fell on the ice  and suffered immediate paralysis that has resolved somewhat and is now suffering a Central Cord Syndrome.  Pts R side is affected more than the L side.  For now, surgery will be delayed since pt's deficits are improving but pt may be a candidate for a future C2-C5 decompression and fusion.  EF 30%    Subjective Data      Cognition  Cognition Arousal/Alertness: Awake/alert Behavior During Therapy: WFL for tasks assessed/performed Overall Cognitive Status: Within Functional Limits for tasks assessed    Balance  General Comments General comments (skin integrity, edema, etc.): Pt voiced understanding of management of cervical collar and wear schedule - all the time.  reviewed cervical precautions and how to set up house to prevent twisting etc for phone, TV, etc.  focus on safety education due to decreased proproception  End of Session PT - End of Session Equipment Utilized During Treatment: Cervical collar Activity Tolerance: Patient tolerated treatment well Patient left: in chair;with call bell/phone within reach Nurse Communication: Mobility status   GP     Loyal Buba 06/29/2013, 11:56 AM  Rande Lawman, PT

## 2013-07-05 DIAGNOSIS — I2510 Atherosclerotic heart disease of native coronary artery without angina pectoris: Secondary | ICD-10-CM | POA: Diagnosis not present

## 2013-07-05 DIAGNOSIS — I739 Peripheral vascular disease, unspecified: Secondary | ICD-10-CM | POA: Diagnosis not present

## 2013-07-05 DIAGNOSIS — Z9181 History of falling: Secondary | ICD-10-CM | POA: Diagnosis not present

## 2013-07-05 DIAGNOSIS — E119 Type 2 diabetes mellitus without complications: Secondary | ICD-10-CM | POA: Diagnosis not present

## 2013-07-05 DIAGNOSIS — S14121A Central cord syndrome at C1 level of cervical spinal cord, initial encounter: Secondary | ICD-10-CM | POA: Diagnosis not present

## 2013-07-05 DIAGNOSIS — I2589 Other forms of chronic ischemic heart disease: Secondary | ICD-10-CM | POA: Diagnosis not present

## 2013-07-05 DIAGNOSIS — IMO0001 Reserved for inherently not codable concepts without codable children: Secondary | ICD-10-CM | POA: Diagnosis not present

## 2013-07-13 ENCOUNTER — Other Ambulatory Visit: Payer: Self-pay | Admitting: Neurological Surgery

## 2013-07-13 DIAGNOSIS — M4802 Spinal stenosis, cervical region: Secondary | ICD-10-CM | POA: Diagnosis not present

## 2013-07-13 DIAGNOSIS — I10 Essential (primary) hypertension: Secondary | ICD-10-CM | POA: Diagnosis not present

## 2013-07-15 DIAGNOSIS — S14121A Central cord syndrome at C1 level of cervical spinal cord, initial encounter: Secondary | ICD-10-CM | POA: Diagnosis not present

## 2013-07-15 DIAGNOSIS — I2510 Atherosclerotic heart disease of native coronary artery without angina pectoris: Secondary | ICD-10-CM | POA: Diagnosis not present

## 2013-07-15 DIAGNOSIS — IMO0001 Reserved for inherently not codable concepts without codable children: Secondary | ICD-10-CM | POA: Diagnosis not present

## 2013-07-15 DIAGNOSIS — I2589 Other forms of chronic ischemic heart disease: Secondary | ICD-10-CM | POA: Diagnosis not present

## 2013-07-15 DIAGNOSIS — E119 Type 2 diabetes mellitus without complications: Secondary | ICD-10-CM | POA: Diagnosis not present

## 2013-07-15 DIAGNOSIS — I739 Peripheral vascular disease, unspecified: Secondary | ICD-10-CM | POA: Diagnosis not present

## 2013-07-19 DIAGNOSIS — I739 Peripheral vascular disease, unspecified: Secondary | ICD-10-CM | POA: Diagnosis not present

## 2013-07-19 DIAGNOSIS — E119 Type 2 diabetes mellitus without complications: Secondary | ICD-10-CM | POA: Diagnosis not present

## 2013-07-19 DIAGNOSIS — I2589 Other forms of chronic ischemic heart disease: Secondary | ICD-10-CM | POA: Diagnosis not present

## 2013-07-19 DIAGNOSIS — S14121A Central cord syndrome at C1 level of cervical spinal cord, initial encounter: Secondary | ICD-10-CM | POA: Diagnosis not present

## 2013-07-19 DIAGNOSIS — IMO0001 Reserved for inherently not codable concepts without codable children: Secondary | ICD-10-CM | POA: Diagnosis not present

## 2013-07-19 DIAGNOSIS — I2510 Atherosclerotic heart disease of native coronary artery without angina pectoris: Secondary | ICD-10-CM | POA: Diagnosis not present

## 2013-07-22 ENCOUNTER — Telehealth: Payer: Self-pay | Admitting: Cardiology

## 2013-07-22 ENCOUNTER — Encounter: Payer: Self-pay | Admitting: Internal Medicine

## 2013-07-22 ENCOUNTER — Ambulatory Visit (INDEPENDENT_AMBULATORY_CARE_PROVIDER_SITE_OTHER): Payer: Medicare Other | Admitting: *Deleted

## 2013-07-22 DIAGNOSIS — I2589 Other forms of chronic ischemic heart disease: Secondary | ICD-10-CM

## 2013-07-22 DIAGNOSIS — I509 Heart failure, unspecified: Secondary | ICD-10-CM

## 2013-07-22 DIAGNOSIS — I255 Ischemic cardiomyopathy: Secondary | ICD-10-CM

## 2013-07-22 LAB — MDC_IDC_ENUM_SESS_TYPE_REMOTE
Battery Remaining Longevity: 70 mo
Battery Remaining Percentage: 84 %
Battery Voltage: 3.01 V
Brady Statistic AP VP Percent: 23 %
Brady Statistic AP VS Percent: 1 %
Brady Statistic AS VP Percent: 77 %
Brady Statistic AS VS Percent: 1 %
Brady Statistic RA Percent Paced: 23 %
Date Time Interrogation Session: 20150319064022
HighPow Impedance: 81 Ohm
HighPow Impedance: 81 Ohm
Implantable Pulse Generator Serial Number: 7097007
Lead Channel Impedance Value: 1150 Ohm
Lead Channel Impedance Value: 480 Ohm
Lead Channel Impedance Value: 490 Ohm
Lead Channel Pacing Threshold Amplitude: 0.5 V
Lead Channel Pacing Threshold Amplitude: 0.75 V
Lead Channel Pacing Threshold Amplitude: 1.5 V
Lead Channel Pacing Threshold Pulse Width: 0.5 ms
Lead Channel Pacing Threshold Pulse Width: 0.5 ms
Lead Channel Pacing Threshold Pulse Width: 1 ms
Lead Channel Sensing Intrinsic Amplitude: 11.9 mV
Lead Channel Sensing Intrinsic Amplitude: 4.2 mV
Lead Channel Setting Pacing Amplitude: 2 V
Lead Channel Setting Pacing Amplitude: 2.5 V
Lead Channel Setting Pacing Amplitude: 2.5 V
Lead Channel Setting Pacing Pulse Width: 0.5 ms
Lead Channel Setting Pacing Pulse Width: 1 ms
Lead Channel Setting Sensing Sensitivity: 0.5 mV
Zone Setting Detection Interval: 250 ms
Zone Setting Detection Interval: 300 ms

## 2013-07-22 NOTE — Telephone Encounter (Signed)
Received request from Nurse fax box, documents faxed for surgical clearance. To: [facility] Fax number: [phone] Attention: [phone]/kdm

## 2013-07-26 DIAGNOSIS — I739 Peripheral vascular disease, unspecified: Secondary | ICD-10-CM | POA: Diagnosis not present

## 2013-07-26 DIAGNOSIS — IMO0001 Reserved for inherently not codable concepts without codable children: Secondary | ICD-10-CM | POA: Diagnosis not present

## 2013-07-26 DIAGNOSIS — I2510 Atherosclerotic heart disease of native coronary artery without angina pectoris: Secondary | ICD-10-CM | POA: Diagnosis not present

## 2013-07-26 DIAGNOSIS — E119 Type 2 diabetes mellitus without complications: Secondary | ICD-10-CM | POA: Diagnosis not present

## 2013-07-26 DIAGNOSIS — I2589 Other forms of chronic ischemic heart disease: Secondary | ICD-10-CM | POA: Diagnosis not present

## 2013-07-26 DIAGNOSIS — S14121A Central cord syndrome at C1 level of cervical spinal cord, initial encounter: Secondary | ICD-10-CM | POA: Diagnosis not present

## 2013-08-02 ENCOUNTER — Encounter (HOSPITAL_COMMUNITY): Payer: Self-pay | Admitting: Pharmacy Technician

## 2013-08-02 ENCOUNTER — Other Ambulatory Visit: Payer: Self-pay

## 2013-08-02 ENCOUNTER — Other Ambulatory Visit: Payer: Self-pay | Admitting: Family Medicine

## 2013-08-02 MED ORDER — METFORMIN HCL 500 MG PO TABS: 1000.0000 mg | ORAL_TABLET | Freq: Two times a day (BID) | ORAL | Status: DC

## 2013-08-02 NOTE — Telephone Encounter (Signed)
Pt left v/m requesting refill metformin ot [facility] [place]; advised Mrs Vizcarrondo done.

## 2013-08-03 NOTE — Progress Notes (Signed)
ICD remote with ICM 

## 2013-08-04 ENCOUNTER — Inpatient Hospital Stay (HOSPITAL_COMMUNITY): Admission: RE | Admit: 2013-08-04 | Payer: Medicare Other | Source: Ambulatory Visit

## 2013-08-04 ENCOUNTER — Encounter: Payer: Self-pay | Admitting: *Deleted

## 2013-08-10 ENCOUNTER — Other Ambulatory Visit (HOSPITAL_COMMUNITY): Payer: Medicare Other

## 2013-08-13 ENCOUNTER — Ambulatory Visit (INDEPENDENT_AMBULATORY_CARE_PROVIDER_SITE_OTHER): Payer: Medicare Other | Admitting: Cardiology

## 2013-08-13 ENCOUNTER — Encounter: Payer: Self-pay | Admitting: Cardiology

## 2013-08-13 VITALS — BP 177/89 | HR 80 | Ht 75.0 in | Wt 182.1 lb

## 2013-08-13 DIAGNOSIS — I2510 Atherosclerotic heart disease of native coronary artery without angina pectoris: Secondary | ICD-10-CM | POA: Diagnosis not present

## 2013-08-13 DIAGNOSIS — I255 Ischemic cardiomyopathy: Secondary | ICD-10-CM

## 2013-08-13 DIAGNOSIS — I10 Essential (primary) hypertension: Secondary | ICD-10-CM

## 2013-08-13 DIAGNOSIS — I442 Atrioventricular block, complete: Secondary | ICD-10-CM

## 2013-08-13 DIAGNOSIS — Z4502 Encounter for adjustment and management of automatic implantable cardiac defibrillator: Secondary | ICD-10-CM

## 2013-08-13 DIAGNOSIS — Z01810 Encounter for preprocedural cardiovascular examination: Secondary | ICD-10-CM | POA: Diagnosis not present

## 2013-08-13 DIAGNOSIS — I2589 Other forms of chronic ischemic heart disease: Secondary | ICD-10-CM | POA: Diagnosis not present

## 2013-08-13 MED ORDER — AMLODIPINE BESYLATE 2.5 MG PO TABS: 2.5000 mg | ORAL_TABLET | Freq: Every day | ORAL | Status: DC

## 2013-08-13 NOTE — Patient Instructions (Addendum)
I spoke with Crystal at Dr Sherley Bounds office who has scheduled you an appointment with Dr Ronnald Ramp on August 24, 2013 at 11 am.  Please be sure that Dr Ronnald Ramp reviews Dr Kae Heller office visit note from today's visit.  If a stimulator is going to be considered please provide [facility] the information (model,etc.) of stimulator so we can give to [place] and our EP cardiologist. Our fax number is [phone].  Your physician has recommended you make the following change in your medication: start taking Amlodipine 2.5 mg daily and call Jeani Hawking (dr Kae Heller nurse) at [phone] with your blood pressure readings in about 1 week.  Your physician wants you to follow-up in: 6 months. You will receive a reminder letter in the mail two months in advance. If you don't receive a letter, please call our office to schedule the follow-up appointment.

## 2013-08-13 NOTE — Assessment & Plan Note (Signed)
The patient is on appropriate medications for his cardiomyopathy. No change in therapy at this time.

## 2013-08-13 NOTE — Assessment & Plan Note (Signed)
The patient is pacemaker dependent. Therefore we will have to be very careful concerning the decision about whether or a stimulator can be used for his neck.

## 2013-08-13 NOTE — Assessment & Plan Note (Signed)
The patient's overall cardiac status is stable. He is stable from the cardiac viewpoint to undergo neurosurgery. He can be cleared in this regard. However, the issue of whether or not he can receive a stimulator is not yet answered. If his stimulator is to be considered, our team will have to know the exact name and type of the device. This will have to be reviewed by [facility]. Jude and are like to physiology team. The patient is pacemaker dependent. This may make it less likely that a stimulator can be used.  As part of today's evaluation I have spent greater than one hour with a total care. I have reviewed all records. I have spoken with the patient extensively. I've talked to the electrophysiology team about the possibility of a stimulator. I have worked with minor is to contact Dr. Ronnald Ramp office so that we can be sure the patient sees him soon for further discussion.

## 2013-08-13 NOTE — Progress Notes (Signed)
Patient ID: Luke [name], male   DOB: [birth year], 68 y.o.   MRN: [id number]    HPI  The patient is here to followup coronary disease and cardiomyopathy. He is also here to help with surgical clearance for cervical neurosurgery. The patient had a fall in February, 2015. He has decreased motion of his hands. He has cervical spine disease. There is consideration for neurosurgery by Dr. Ronnald Ramp. The patient needs surgical clearance for this. At this point the patient relates that we'll be very helpful for him to discuss further all possible aspects of the surgery with Dr. Ronnald Ramp. We are helping to make an appointment for the patient with Dr. Ronnald Ramp. There is question about a possible nerve stimulator. We will have to carefully assess whether this can be used in this patient who has an implantable defibrillator.  His overall cardiac status is stable. He's not having any chest pain or shortness of breath. He has not had syncope or presyncope.  The patient's blood pressure has been elevated. I have reviewed the history and his blood pressure machine. There is a significant variation. He does appear to have systolics in the range of 160-200 at times.  Allergies  Allergen Reactions  . Aldactone [Spironolactone] Other (See Comments)    Hyperkalemia  . Codeine Hives    Current Outpatient Prescriptions  Medication Sig Dispense Refill  . aspirin EC 81 MG tablet Take 81 mg by mouth daily.      . carvedilol (COREG) 25 MG tablet Take 1 tablet (25 mg total) by mouth 2 (two) times daily with a meal.  60 tablet  9  . glipiZIDE (GLUCOTROL) 5 MG tablet Take 5 mg by mouth 2 (two) times daily before a meal.      . lisinopril (PRINIVIL,ZESTRIL) 20 MG tablet Take 1 tablet (20 mg total) by mouth 2 (two) times daily.  180 tablet  3  . metFORMIN (GLUCOPHAGE) 500 MG tablet Take 2 tablets (1,000 mg total) by mouth 2 (two) times daily with a meal.  360 tablet  1  . Multiple Vitamin (MULTIVITAMIN) tablet Take 1 tablet by  mouth daily.       Marland Kitchen oxycodone (OXY-IR) 5 MG capsule Take 5 mg by mouth every 4 (four) hours as needed for pain.      . sildenafil (VIAGRA) 50 MG tablet Take 50 mg by mouth daily as needed for erectile dysfunction.      . simvastatin (ZOCOR) 40 MG tablet Take 40 mg by mouth at bedtime.       No current facility-administered medications for this visit.    History   Social History  . Marital Status: Married    Spouse Name: N/A    Number of Children: N/A  . Years of Education: N/A   Occupational History  . Not on file.   Social History Main Topics  . Smoking status: Former Smoker -- 2.00 packs/day for 40 years    Types: Cigarettes    Quit date: 05/06/2004  . Smokeless tobacco: Never Used     Comment: quit about 7 years ago  . Alcohol Use: 1.2 oz/week    2 Cans of beer per week     Comment: beer or wine  occassionally  . Drug Use: No  . Sexual Activity: Not on file   Other Topics Concern  . Not on file   Social History Narrative   [place] vet   retired    Family History  Problem Relation Age of Onset  .  Hypertension Mother   . Stroke Mother   . Diabetes Sister   . Heart disease Sister   . Hypertension Sister   . Colon cancer Neg Hx   . Prostate cancer Neg Hx   . Heart disease Father     Past Medical History  Diagnosis Date  . Diabetes mellitus     type II  . PAD (peripheral artery disease)   . AAA (abdominal aortic aneurysm) 2011    Per vascular surgery  . HLD (hyperlipidemia)   . Hypertension     white coat HTN-- often elevated in office and controlled on outside checks.  . Low testosterone     Hx of  . Cardiomyopathy     Nuclear, October 09, 2011, EF 30%, multiple focal wall motion abnormalities  . Ejection fraction < 50%     EF 30%, nuclear, October 09, 2011  . LBBB (left bundle branch block)     LBBB on EKG October 11, 2011,  no prior EKG has been done  . CAD (coronary artery disease)     Presumed CAD with nuclear scan October 09, 2011,  large anteroseptal MI and  inferior MI. Catheterization scheduled October 15, 2011  . Anxiety     no med. in use for anxiety but pt. speaks openly of his stress & anxious feelings regarding impending surgery    . ICD (implantable cardioverter-defibrillator) in place     CRT-D placed March, 2014 complete heart block and k dysfunction  . Ejection fraction < 50%     EF previously 30%  //   EF 30-35%, echo, July 14, 2012, severe diffuse hypokinesis, PA pressure 43 mm mercury  . Pacemaker   . Myocardial infarction   . Drug therapy     Hyperkalemia with spironolactone  . Automatic implantable cardioverter-defibrillator in situ     Past Surgical History  Procedure Laterality Date  . Tonsillectomy      as a child   . Colonoscopy    . Cardiac catheterization    . Abdominal aortic aneurysm repair      EVAR   . Pacemaker insertion  07-16-12  . Umbilical hernia repair N/A 05/14/2013    Procedure: LAPAROSCOPIC exploration and repair of hernia in abdominal ;  Surgeon: Adin Hector, MD;  Location: [place];  Service: General;  Laterality: N/A;  . Insertion of mesh N/A 05/14/2013    Procedure: INSERTION OF MESH;  Surgeon: Adin Hector, MD;  Location: [place];  Service: General;  Laterality: N/A;    Patient Active Problem List   Diagnosis Date Noted  . Preop cardiovascular exam 08/13/2013  . Central cord syndrome 06/24/2013  . Biventricular defibrillator-St. Jude 04/20/2013  . Drug therapy   . Umbilical hernia, incarcerated s/p lap repair with mesh 05/14/2013 03/19/2013  . Abdominal aneurysm without mention of rupture 12/30/2012  . HLD (hyperlipidemia) 09/16/2012  . Ejection fraction < 50%   . Complete heart block 07/14/2012  . SK (seborrheic keratosis) 02/12/2012  . Shoulder pain 11/08/2011  . CAD (coronary artery disease)   . Ischemic cardiomyopathy   . LBBB (left bundle branch block)   . PVD (peripheral vascular disease) 10/08/2010  . ORGANIC IMPOTENCE 07/05/2010  . ESSENTIAL HYPERTENSION, BENIGN 02/27/2010  . Type 2  diabetes mellitus with vascular disease 11/16/2009    ROS   Patient denies fever, chills, headache, sweats, rash, change in vision, change in hearing, chest pain, cough, nausea or vomiting, urinary symptoms. All other systems are reviewed and are  negative.  PHYSICAL EXAM  Patient is oriented to person time and place. Affect is normal. He is anxious about the fact that he does not have a full understanding of the possible neurosurgery. There is no jugulovenous distention. Lungs are clear. Respiratory effort is nonlabored. Cardiac exam reveals S1 and S2. There are no clicks or significant murmurs. The abdomen is soft. There is no peripheral edema. He is not able to use his hands for fine motor functions. There are no skin rashes.  Filed Vitals:   08/13/13 1437  BP: 177/89  Pulse: 80  Height: 6\' 3"  (1.905 m)  Weight: 182 lb 1.9 oz (82.609 kg)   I reviewed the patient's last EKG. The rhythm was paced at that time.  ASSESSMENT & PLAN

## 2013-08-13 NOTE — Assessment & Plan Note (Signed)
The patient has a biventricular defibrillator. When we learn more about whether a stimulator might be needed, we will have to have the specific information reviewed by [facility] Jude and our electrophysiology team to see if this stimulator can be considered.

## 2013-08-13 NOTE — Assessment & Plan Note (Signed)
The patient's coronary disease is stable. His last cath was June, 2013. He had moderate disease. He does not need any further testing at this time.

## 2013-08-13 NOTE — Assessment & Plan Note (Signed)
The pressure is higher than it has been. Some of this may be related to his anxiety over possible neck surgery. The pressures are high enough that I do want to add additional therapy. 2.5 mg of amlodipine will be added. We will have him call his blood pressures back in to see if he needs a higher dose.

## 2013-08-23 ENCOUNTER — Ambulatory Visit (HOSPITAL_COMMUNITY)
Admission: RE | Admit: 2013-08-23 | Discharge: 2013-08-23 | Disposition: A | Discharge status: Home / Self Care | Payer: Medicare Other | Source: Ambulatory Visit | Attending: Neurological Surgery | Admitting: Neurological Surgery

## 2013-08-23 ENCOUNTER — Encounter: Payer: Self-pay | Admitting: Cardiology

## 2013-08-23 ENCOUNTER — Encounter (HOSPITAL_COMMUNITY)
Admission: RE | Admit: 2013-08-23 | Discharge: 2013-08-23 | Disposition: A | Discharge status: Home / Self Care | Payer: Medicare Other | Source: Ambulatory Visit | Attending: Neurological Surgery | Admitting: Neurological Surgery

## 2013-08-23 ENCOUNTER — Encounter (HOSPITAL_COMMUNITY): Payer: Self-pay

## 2013-08-23 ENCOUNTER — Telehealth: Payer: Self-pay | Admitting: Cardiology

## 2013-08-23 DIAGNOSIS — Z01818 Encounter for other preprocedural examination: Secondary | ICD-10-CM | POA: Insufficient documentation

## 2013-08-23 DIAGNOSIS — Z01812 Encounter for preprocedural laboratory examination: Secondary | ICD-10-CM | POA: Diagnosis not present

## 2013-08-23 LAB — CBC WITH DIFFERENTIAL/PLATELET
Basophils Absolute: 0 10*3/uL (ref 0.0–0.1)
Basophils Relative: 0 % (ref 0–1)
Eosinophils Absolute: 0.2 10*3/uL (ref 0.0–0.7)
Eosinophils Relative: 2 % (ref 0–5)
HCT: 40.4 % (ref 39.0–52.0)
Hemoglobin: 13.9 g/dL (ref 13.0–17.0)
Lymphocytes Relative: 23 % (ref 12–46)
Lymphs Abs: 2.7 10*3/uL (ref 0.7–4.0)
MCH: 33.2 pg (ref 26.0–34.0)
MCHC: 34.4 g/dL (ref 30.0–36.0)
MCV: 96.4 fL (ref 78.0–100.0)
Monocytes Absolute: 0.7 10*3/uL (ref 0.1–1.0)
Monocytes Relative: 6 % (ref 3–12)
Neutro Abs: 7.9 10*3/uL — ABNORMAL HIGH (ref 1.7–7.7)
Neutrophils Relative %: 69 % (ref 43–77)
Platelets: 230 10*3/uL (ref 150–400)
RBC: 4.19 MIL/uL — ABNORMAL LOW (ref 4.22–5.81)
RDW: 13.2 % (ref 11.5–15.5)
WBC: 11.4 10*3/uL — ABNORMAL HIGH (ref 4.0–10.5)

## 2013-08-23 LAB — BASIC METABOLIC PANEL
BUN: 24 mg/dL — ABNORMAL HIGH (ref 6–23)
CO2: 23 mEq/L (ref 19–32)
Calcium: 10.1 mg/dL (ref 8.4–10.5)
Chloride: 103 mEq/L (ref 96–112)
Creatinine, Ser: 1.04 mg/dL (ref 0.50–1.35)
GFR calc Af Amer: 84 mL/min — ABNORMAL LOW (ref >90)
GFR calc non Af Amer: 73 mL/min — ABNORMAL LOW (ref >90)
Glucose, Bld: 189 mg/dL — ABNORMAL HIGH (ref 70–99)
Potassium: 4.7 mEq/L (ref 3.7–5.3)
Sodium: 142 mEq/L (ref 137–147)

## 2013-08-23 LAB — SURGICAL PCR SCREEN
MRSA, PCR: NEGATIVE
Staphylococcus aureus: NEGATIVE

## 2013-08-23 LAB — PROTIME-INR
INR: 0.95 (ref 0.00–1.49)
Prothrombin Time: 12.5 seconds (ref 11.6–15.2)

## 2013-08-23 NOTE — Telephone Encounter (Signed)
Left detailed message on the pts VM stating that his BP readings are good per Dr [name] and to continue to taking Amlodipine 2.5 mg daily. I also asked him to call back if he has any questions or concerns.

## 2013-08-23 NOTE — Telephone Encounter (Signed)
Please let patient know that I have seen these blood pressure readings. They are good. He should stay on a small dose of amlodipine

## 2013-08-23 NOTE — Telephone Encounter (Signed)
**Note De-Identified Nelma Phagan Obfuscation** The pt was advised at last OV with Dr [name] on 4/10 to start taking Amlodipine 2.5 mg daily and call the office in 1 week with his blood pressure readings. 4/13 BP= 128/72 HR= 71 4/14 BP= 131/70 HR= 70 4/15 BP= 128/70 HR= 70 4/16 BP= 123/72 HR= 70 4/17 BP= 128/68 HR= 68 4/18 BP= 132/74 HR= 70 4/19 BP= 131/72 HR= 70 4/20 BP= 141/82 HR= 68 He states that he is tolerating Amlodipine well and that he is going to hospital today for his presurgical appt.

## 2013-08-23 NOTE — Progress Notes (Signed)
Patient ID: Luke [name], male   DOB: [birth year], 68 y.o.   MRN: [id number]  Patient was seen in the office on August 13, 2013. I started 2.5 mg of amlodipine. He is called his blood pressures back in and they're quite good. We will leave him on this dose.  Daryel November, MD

## 2013-08-23 NOTE — Pre-Procedure Instructions (Addendum)
Luke [name]  08/23/2013   Your procedure is scheduled on:  Wednesday, April 29.  Report to [facility], Main Entrance [facility] "A" at 11:00AM.  Call this number if you have problems the morning of surgery: [phone]   Remember:   Do not eat food or drink liquids after midnight Tuesday, April 28.   Take these medicines the morning of surgery with A SIP OF WATER: amLODipine (NORVASC), carvedilol (COREG).              Take if needed: oxycodone (OXY-IR).     Take all meds as ordered until day of surgery except as instructed below and per dr    Bridgette Habermann all herbel meds, nsaids (aleve,naproxen,advil,ibuprofen) 5 days prior to surgery including  Vitamins,aspirin       NO DIABETIC MEDS DAY OF SURGERY   Do not wear jewelry, make-up or nail polish.  Do not wear lotions, powders, or perfumes.  Men may shave face and neck.  Do not bring valuables to the hospital.  [facility] is not responsible  for any belongings or valuables.               Contacts, dentures or bridgework may not be worn into surgery.  Leave suitcase in the car. After surgery it may be brought to your room.  For patients admitted to the hospital, discharge time is determined by your                treatment team.               Patients discharged the day of surgery will not be allowed to drive home.  Name and phone number of your driver: -   Special Instructions:- Special Instructions: [place] - Preparing for Surgery  Before surgery, you can play an important role.  Because skin is not sterile, your skin needs to be as free of germs as possible.  You can reduce the number of germs on you skin by washing with CHG (chlorahexidine gluconate) soap before surgery.  CHG is an antiseptic cleaner which kills germs and bonds with the skin to continue killing germs even after washing.  Please DO NOT use if you have an allergy to CHG or antibacterial soaps.  If your skin becomes reddened/irritated stop using the CHG  and inform your nurse when you arrive at Short Stay.  Do not shave (including legs and underarms) for at least 48 hours prior to the first CHG shower.  You may shave your face.  Please follow these instructions carefully:   1.  Shower with CHG Soap the night before surgery and the morning of Surgery.  2.  If you choose to wash your hair, wash your hair first as usual with your normal shampoo.  3.  After you shampoo, rinse your hair and body thoroughly to remove the Shampoo.  4.  Use CHG as you would any other liquid soap.  You can apply chg directly  to the skin and wash gently with scrungie or a clean washcloth.  5.  Apply the CHG Soap to your body ONLY FROM THE NECK DOWN.  Do not use on open wounds or open sores.  Avoid contact with your eyes ears, mouth and genitals (private parts).  Wash genitals (private parts)       with your normal soap.  6.  Wash thoroughly, paying special attention to the area where your surgery will be performed.  7.  Thoroughly rinse your body  with warm water from the neck down.  8.  DO NOT shower/wash with your normal soap after using and rinsing off the CHG Soap.  9.  Pat yourself dry with a clean towel.            10.  Wear clean pajamas.            11.  Place clean sheets on your bed the night of your first shower and do not sleep with pets.  Day of Surgery  Do not apply any lotions/deodorants the morning of surgery.  Please wear clean clothes to the hospital/surgery center.   Please read over the following fact sheets that you were given: Pain Booklet, Coughing and Deep Breathing and Surgical Site Infection Prevention

## 2013-08-23 NOTE — Telephone Encounter (Signed)
New message     B/p readings 08-16-13 128/72 pulse 71; 4-14 131/70 pulse 70; 4-15 128/70 pulse 70; 4-16 123/72 pulse 70; 4-17 128/68 pulse 68; 4-18 132/74 pulse 70; 4-19 131/72 pulse 70; 4-20 141/82 pulse 68 (going to hosp today for presurgical stuff) Pt is tolerating amlodipine very well.

## 2013-08-23 NOTE — Progress Notes (Signed)
St jude rep called due to positon of patient during surgery.orders from dr not req but unsure if he aware of position of of patient

## 2013-08-24 DIAGNOSIS — M4802 Spinal stenosis, cervical region: Secondary | ICD-10-CM | POA: Diagnosis not present

## 2013-08-24 DIAGNOSIS — I10 Essential (primary) hypertension: Secondary | ICD-10-CM | POA: Diagnosis not present

## 2013-08-25 ENCOUNTER — Encounter (HOSPITAL_COMMUNITY): Payer: Self-pay | Admitting: Vascular Surgery

## 2013-08-25 ENCOUNTER — Encounter (HOSPITAL_COMMUNITY): Payer: Self-pay

## 2013-08-25 NOTE — Progress Notes (Signed)
Anesthesia chart review: Patient is a 68 year old male scheduled for C2-4 laminectomy, C2-5 posterior cervical fusion on 09/01/13 by Dr. Ronnald Ramp.  History includes EVAR of AAA 12/03/11, former smoker, DM2, HTN, CAD, ischemic cardiomyopathy, left BBB with intermittent CHB, s/p dual chamber ICD (MADIT-CRT) St. Jude 07/14/12, anxiety, low T, HLD, PAD, UHR 05/2013. PCP is Dr. Elsie Stain. His Cardiologist is Dr. [name] who cleared the patient for this procedure. EP Cardiologist is Dr. Caryl Comes.  Echo on 07/15/12 showed: - Left ventricle: The cavity size was moderately dilated. Wall thickness was normal. Systolic function was moderately to severely reduced. The estimated ejection fraction was in the range of 30% to 35%. Severe diffuse hypokinesis. There was fusion of early and atrial contributions to ventricular filling. - Mitral valve: Trivial regurgitation. - Left atrium: The atrium was mildly dilated. - Pulmonary arteries: Systolic pressure was mildly increased. PA peak pressure: 36mm Hg (S).  He had an abnormal stress test on 10/09/11 that showed: There is evidence of a previous large anterior septal MI with an inferior septal MI. The LV is markedly dilated with global hypokinesis. There is severe hypokinesis in the anterior septal and inferior septal walls. LV Ejection Fraction: 30%. He subsequently had a cardiac cath on 10/15/11 that showed:  1. Moderate to severe 2 vessel obstructive CAD (prox LAD 70-80%, mid LAD 40%, LCX normal, mid RCA 50-60%, distal RCA 70%, bifurcation of PDA/PLOM 70%).  2. Severe LV dysfunction. EF 30-35%.  Ultimately a decision was made to treat him medically.   EKG on 04/20/13 showed v-paced rhythm.  CXR on 08/23/13 showed no acute cardiopulmonary disease.   Preoperative labs noted. His A1C on 03/11/13 was 7.3.  If no acute changes then anticipate that he can proceed as planned.  He is having a posterior cervical procedure, so I will have staff refax his AICD perioperative device  form and notify the St. Jude rep.  George Hugh [facility] Short Stay Center/Anesthesiology Phone [phone] 08/25/2013 11:20 AM

## 2013-08-27 NOTE — Progress Notes (Signed)
Spoke with Windle Guard with [employer] and informed him of pts orders, surgery scheduled/positioning and surgery time.  States will be here then.

## 2013-08-31 DIAGNOSIS — E1139 Type 2 diabetes mellitus with other diabetic ophthalmic complication: Secondary | ICD-10-CM | POA: Diagnosis not present

## 2013-08-31 LAB — HM DIABETES EYE EXAM

## 2013-09-09 ENCOUNTER — Other Ambulatory Visit: Payer: Self-pay | Admitting: Family Medicine

## 2013-09-09 DIAGNOSIS — E1159 Type 2 diabetes mellitus with other circulatory complications: Secondary | ICD-10-CM

## 2013-09-13 ENCOUNTER — Other Ambulatory Visit (INDEPENDENT_AMBULATORY_CARE_PROVIDER_SITE_OTHER): Payer: Medicare Other

## 2013-09-13 DIAGNOSIS — E785 Hyperlipidemia, unspecified: Secondary | ICD-10-CM

## 2013-09-13 DIAGNOSIS — I798 Other disorders of arteries, arterioles and capillaries in diseases classified elsewhere: Secondary | ICD-10-CM

## 2013-09-13 DIAGNOSIS — E1159 Type 2 diabetes mellitus with other circulatory complications: Secondary | ICD-10-CM

## 2013-09-13 DIAGNOSIS — I10 Essential (primary) hypertension: Secondary | ICD-10-CM | POA: Diagnosis not present

## 2013-09-13 LAB — LIPID PANEL
Cholesterol: 134 mg/dL (ref 0–200)
HDL: 36.9 mg/dL — ABNORMAL LOW (ref >39.00)
LDL Cholesterol: 53 mg/dL (ref 0–99)
Total CHOL/HDL Ratio: 4
Triglycerides: 222 mg/dL — ABNORMAL HIGH (ref 0.0–149.0)
VLDL: 44.4 mg/dL — ABNORMAL HIGH (ref 0.0–40.0)

## 2013-09-13 LAB — HEMOGLOBIN A1C: Hgb A1c MFr Bld: 7.7 % — ABNORMAL HIGH (ref 4.6–6.5)

## 2013-09-15 ENCOUNTER — Ambulatory Visit (INDEPENDENT_AMBULATORY_CARE_PROVIDER_SITE_OTHER): Payer: Medicare Other | Admitting: Family Medicine

## 2013-09-15 ENCOUNTER — Encounter: Payer: Self-pay | Admitting: Family Medicine

## 2013-09-15 VITALS — BP 142/80 | HR 74 | Temp 98.3°F | Wt 184.8 lb

## 2013-09-15 DIAGNOSIS — IMO0002 Reserved for concepts with insufficient information to code with codable children: Secondary | ICD-10-CM | POA: Diagnosis not present

## 2013-09-15 DIAGNOSIS — E1159 Type 2 diabetes mellitus with other circulatory complications: Secondary | ICD-10-CM

## 2013-09-15 DIAGNOSIS — I10 Essential (primary) hypertension: Secondary | ICD-10-CM | POA: Diagnosis not present

## 2013-09-15 DIAGNOSIS — I2510 Atherosclerotic heart disease of native coronary artery without angina pectoris: Secondary | ICD-10-CM | POA: Diagnosis not present

## 2013-09-15 DIAGNOSIS — I798 Other disorders of arteries, arterioles and capillaries in diseases classified elsewhere: Secondary | ICD-10-CM | POA: Diagnosis not present

## 2013-09-15 DIAGNOSIS — S14129A Central cord syndrome at unspecified level of cervical spinal cord, initial encounter: Secondary | ICD-10-CM

## 2013-09-15 MED ORDER — SILDENAFIL CITRATE 20 MG PO TABS: 60.0000 mg | ORAL_TABLET | Freq: Every day | ORAL | Status: DC | PRN

## 2013-09-15 NOTE — Assessment & Plan Note (Signed)
Given the upheaval, his A1c is good.  He'll work on diet and exercise and we'll recheck later in 2015. He agrees.

## 2013-09-15 NOTE — Assessment & Plan Note (Signed)
Given the upheaval, his BP is good.  He'll work on diet and exercise and we'll recheck later in 2015. He agrees. Continue amlodipine and other baseline meds.

## 2013-09-15 NOTE — Progress Notes (Signed)
Pre visit review using our clinic review tool, if applicable. No additional management support is needed unless otherwise documented below in the visit note.  Cord sx noted after fall.  He talked with Dr. Ronnald Ramp about his situation and potential surgery.  He pushed surgery back until the fall of this year.  He is back at the gym in the meantime, on the bike and treadmill.  More reps, less weights. Sensation in his legs still altered, better than his hands but not back to baseline. L side is better than R side now.  Using cane rarely now.   Hypertension:    Using medication without problems or lightheadedness: yes Chest pain with exertion:no Edema:no Short of breath:no Average home BPs: improved with amlodipine.  120s/70s.   Diabetes:  Using medications without difficulties:yes Hypoglycemic episodes:no Hyperglycemic episodes:no Feet problems:at baseline, see above Blood Sugars averaging: variable eye exam within last year: yes, 2 weeks ago.  A1c up slightly and this was expected given the above.    PMH and SH reviewed  Meds, vitals, and allergies reviewed.   ROS: See HPI.  Otherwise negative.    GEN: nad, alert and oriented HEENT: mucous membranes moist NECK: supple w/o LA CV: rrr. PULM: ctab, no inc wob ABD: soft, +bs EXT: no edema SKIN: no acute rash Coordination changes in R>L hand on finger to nose testing noted.   Diabetic foot exam: Normal inspection No skin breakdown No calluses  Dec DP pulses at baseline.  Normal sensation to light touch and monofilament Nails normal

## 2013-09-15 NOTE — Assessment & Plan Note (Signed)
Slow improvement with surgery pending for the fall. I'll defer to surgery and app help of all involved.

## 2013-09-15 NOTE — Patient Instructions (Signed)
Let's get together before your surgery.  Take care in the meantime.  Glad to see you.  Don't change your meds.

## 2013-10-01 ENCOUNTER — Encounter: Payer: Self-pay | Admitting: Family Medicine

## 2013-10-25 ENCOUNTER — Ambulatory Visit (INDEPENDENT_AMBULATORY_CARE_PROVIDER_SITE_OTHER): Payer: Medicare Other | Admitting: *Deleted

## 2013-10-25 ENCOUNTER — Encounter: Payer: Self-pay | Admitting: Internal Medicine

## 2013-10-25 DIAGNOSIS — I509 Heart failure, unspecified: Secondary | ICD-10-CM

## 2013-10-25 DIAGNOSIS — I2589 Other forms of chronic ischemic heart disease: Secondary | ICD-10-CM

## 2013-10-25 DIAGNOSIS — I255 Ischemic cardiomyopathy: Secondary | ICD-10-CM

## 2013-10-25 NOTE — Progress Notes (Signed)
Remote ICD transmission.   

## 2013-11-01 LAB — MDC_IDC_ENUM_SESS_TYPE_REMOTE
Battery Remaining Longevity: 67 mo
Battery Remaining Percentage: 82 %
Battery Voltage: 2.99 V
Brady Statistic AP VP Percent: 19 %
Brady Statistic AP VS Percent: 1 %
Brady Statistic AS VP Percent: 81 %
Brady Statistic AS VS Percent: 1 %
Brady Statistic RA Percent Paced: 19 %
Date Time Interrogation Session: 20150622060018
HighPow Impedance: 78 Ohm
HighPow Impedance: 78 Ohm
Implantable Pulse Generator Serial Number: 7097007
Lead Channel Impedance Value: 1150 Ohm
Lead Channel Impedance Value: 450 Ohm
Lead Channel Impedance Value: 490 Ohm
Lead Channel Pacing Threshold Amplitude: 0.5 V
Lead Channel Pacing Threshold Amplitude: 0.75 V
Lead Channel Pacing Threshold Amplitude: 1.5 V
Lead Channel Pacing Threshold Pulse Width: 0.5 ms
Lead Channel Pacing Threshold Pulse Width: 0.5 ms
Lead Channel Pacing Threshold Pulse Width: 1 ms
Lead Channel Sensing Intrinsic Amplitude: 11.9 mV
Lead Channel Sensing Intrinsic Amplitude: 4.4 mV
Lead Channel Setting Pacing Amplitude: 2 V
Lead Channel Setting Pacing Amplitude: 2.5 V
Lead Channel Setting Pacing Amplitude: 2.5 V
Lead Channel Setting Pacing Pulse Width: 0.5 ms
Lead Channel Setting Pacing Pulse Width: 1 ms
Lead Channel Setting Sensing Sensitivity: 0.5 mV
Zone Setting Detection Interval: 250 ms
Zone Setting Detection Interval: 300 ms

## 2013-11-09 ENCOUNTER — Encounter: Payer: Self-pay | Admitting: Cardiology

## 2013-11-29 ENCOUNTER — Inpatient Hospital Stay (HOSPITAL_COMMUNITY): Admission: RE | Admit: 2013-11-29 | Payer: Medicare Other | Source: Ambulatory Visit

## 2013-12-02 ENCOUNTER — Ambulatory Visit (INDEPENDENT_AMBULATORY_CARE_PROVIDER_SITE_OTHER): Payer: Medicare Other | Admitting: Family Medicine

## 2013-12-02 ENCOUNTER — Encounter: Payer: Self-pay | Admitting: Family Medicine

## 2013-12-02 VITALS — BP 162/80 | HR 69 | Temp 97.6°F | Wt 187.2 lb

## 2013-12-02 DIAGNOSIS — S14121A Central cord syndrome at C1 level of cervical spinal cord, initial encounter: Secondary | ICD-10-CM

## 2013-12-02 DIAGNOSIS — E1159 Type 2 diabetes mellitus with other circulatory complications: Secondary | ICD-10-CM | POA: Diagnosis not present

## 2013-12-02 DIAGNOSIS — I10 Essential (primary) hypertension: Secondary | ICD-10-CM | POA: Diagnosis not present

## 2013-12-02 DIAGNOSIS — I798 Other disorders of arteries, arterioles and capillaries in diseases classified elsewhere: Secondary | ICD-10-CM

## 2013-12-02 DIAGNOSIS — S14129D Central cord syndrome at unspecified level of cervical spinal cord, subsequent encounter: Secondary | ICD-10-CM

## 2013-12-02 DIAGNOSIS — I2510 Atherosclerotic heart disease of native coronary artery without angina pectoris: Secondary | ICD-10-CM | POA: Diagnosis not present

## 2013-12-02 DIAGNOSIS — Z5189 Encounter for other specified aftercare: Secondary | ICD-10-CM | POA: Diagnosis not present

## 2013-12-02 NOTE — Patient Instructions (Addendum)
Rosaria Ferries will call about your referral. I would recheck in 03/2014- we can change that if needed depending on potential surgery dates .

## 2013-12-02 NOTE — Progress Notes (Signed)
Pre visit review using our clinic review tool, if applicable. No additional management support is needed unless otherwise documented below in the visit note.  He is going to get a second opinion before he potentially has surgery for his neck, back.  That is pending, he wanted to go to [facility].  He still has numbness/lack of sensation in his hands, 50% better than prev.  Still with sensation changes in the legs and feet.  He is back to working with weights and that has helped some.   Hypertension:    Using medication without problems: yes, but occ lightheaded in the early afternoon. He manages w/o falls.   Chest pain with exertion:no Edema:no Short of breath:no Average home BPs: controlled, SBP ~120 this AM.   Vascular f/u for fall 2015.   Meds, vitals, and allergies reviewed.   ROS: See HPI.  Otherwise, noncontributory.  GEN: nad, alert and oriented HEENT: mucous membranes moist NECK: supple w/o LA CV: rrr.  PULM: ctab, no inc wob ABD: soft, +bs EXT: no edema SKIN: no acute rash

## 2013-12-03 NOTE — Assessment & Plan Note (Signed)
Recheck later in 2015.  He agrees.

## 2013-12-03 NOTE — Assessment & Plan Note (Signed)
Controlled on home checks. Continue as is.  Usually up here in clinic.

## 2013-12-03 NOTE — Assessment & Plan Note (Signed)
He'll get second opinion at [facility].  His main question is whether or not he should have his lower back evaluated given the leg sx, though the leg and arm sx could both arise from his neck.  D/w pt.  Will app help [facility] input.

## 2013-12-08 ENCOUNTER — Inpatient Hospital Stay (HOSPITAL_COMMUNITY): Admission: RE | Admit: 2013-12-08 | Payer: Medicare Other | Source: Ambulatory Visit | Admitting: Neurological Surgery

## 2013-12-08 ENCOUNTER — Encounter (HOSPITAL_COMMUNITY): Admission: RE | Payer: Self-pay | Source: Ambulatory Visit

## 2013-12-08 SURGERY — POSTERIOR CERVICAL FUSION/FORAMINOTOMY LEVEL 3
Anesthesia: General

## 2013-12-27 DIAGNOSIS — M545 Low back pain, unspecified: Secondary | ICD-10-CM | POA: Diagnosis not present

## 2013-12-27 DIAGNOSIS — G992 Myelopathy in diseases classified elsewhere: Secondary | ICD-10-CM | POA: Insufficient documentation

## 2013-12-27 DIAGNOSIS — M47812 Spondylosis without myelopathy or radiculopathy, cervical region: Secondary | ICD-10-CM | POA: Diagnosis not present

## 2013-12-27 DIAGNOSIS — Z981 Arthrodesis status: Secondary | ICD-10-CM | POA: Diagnosis not present

## 2013-12-27 DIAGNOSIS — M4712 Other spondylosis with myelopathy, cervical region: Secondary | ICD-10-CM | POA: Diagnosis not present

## 2013-12-27 DIAGNOSIS — M4802 Spinal stenosis, cervical region: Secondary | ICD-10-CM | POA: Diagnosis not present

## 2014-01-04 ENCOUNTER — Encounter: Payer: Self-pay | Admitting: Family

## 2014-01-05 ENCOUNTER — Ambulatory Visit (HOSPITAL_COMMUNITY)
Admission: RE | Admit: 2014-01-05 | Discharge: 2014-01-05 | Disposition: A | Discharge status: Home / Self Care | Payer: Medicare Other | Source: Ambulatory Visit | Attending: Vascular Surgery | Admitting: Vascular Surgery

## 2014-01-05 ENCOUNTER — Ambulatory Visit (INDEPENDENT_AMBULATORY_CARE_PROVIDER_SITE_OTHER): Payer: Medicare Other | Admitting: Family

## 2014-01-05 ENCOUNTER — Encounter: Payer: Self-pay | Admitting: Family

## 2014-01-05 VITALS — BP 173/90 | HR 66 | Resp 16 | Ht 75.0 in | Wt 186.0 lb

## 2014-01-05 DIAGNOSIS — I714 Abdominal aortic aneurysm, without rupture, unspecified: Secondary | ICD-10-CM | POA: Diagnosis not present

## 2014-01-05 DIAGNOSIS — I2510 Atherosclerotic heart disease of native coronary artery without angina pectoris: Secondary | ICD-10-CM

## 2014-01-05 DIAGNOSIS — Z87891 Personal history of nicotine dependence: Secondary | ICD-10-CM | POA: Diagnosis not present

## 2014-01-05 DIAGNOSIS — I252 Old myocardial infarction: Secondary | ICD-10-CM | POA: Insufficient documentation

## 2014-01-05 DIAGNOSIS — E119 Type 2 diabetes mellitus without complications: Secondary | ICD-10-CM | POA: Insufficient documentation

## 2014-01-05 DIAGNOSIS — Z95810 Presence of automatic (implantable) cardiac defibrillator: Secondary | ICD-10-CM | POA: Diagnosis not present

## 2014-01-05 DIAGNOSIS — G609 Hereditary and idiopathic neuropathy, unspecified: Secondary | ICD-10-CM | POA: Insufficient documentation

## 2014-01-05 DIAGNOSIS — Z9889 Other specified postprocedural states: Secondary | ICD-10-CM | POA: Insufficient documentation

## 2014-01-05 DIAGNOSIS — Z48812 Encounter for surgical aftercare following surgery on the circulatory system: Secondary | ICD-10-CM

## 2014-01-05 NOTE — Progress Notes (Signed)
[facility] [place]  Established EVAR  History of Present Illness  Luke [name] is a 68 y.o. (August 13, 1945) male patient of Dr. Scot Dock who is s/p EVAR of a 6 cm infrarenal abdominal aortic aneurysm on 12/03/2011 and returns for routine follow up.   Pt states his blood pressure is usually 120's/70's, he checks it at home. He slipped on the ice February, 2015, and has had lack of sensation in both hands and feet since then, he sees Dr. Ronnald Ramp, neurosurgeon, for this. He denies back pain from this or any other source, also denies abdominal pain. He exercises at a gym at least 5 days/week.  He takes a daily statin, ASA, and a beta blocker. He denies any history of stroke or TIA. He did have a silent MI, now has a pacemaker and defibrillator. He has peripheral neuropathy. He develops a right calf pain after walking about 2 minutes on a treadmill which resolves with walking more, he denies non healing wounds.  Pt Diabetic: Yes, states in good control Pt smoker: former smoker, quit in 2006, smoked for 50 years   Past Medical History  Diagnosis Date  . Diabetes mellitus     type II  . PAD (peripheral artery disease)   . AAA (abdominal aortic aneurysm) 2011    Per vascular surgery  . HLD (hyperlipidemia)   . Hypertension     white coat HTN-- often elevated in office and controlled on outside checks.  . Low testosterone     Hx of  . Cardiomyopathy     Nuclear, October 09, 2011, EF 30%, multiple focal wall motion abnormalities  . Ejection fraction < 50%     EF 30%, nuclear, October 09, 2011  . LBBB (left bundle branch block)     LBBB on EKG October 11, 2011,  no prior EKG has been done  . CAD (coronary artery disease)     Presumed CAD with nuclear scan October 09, 2011,  large anteroseptal MI and inferior MI. Catheterization scheduled October 15, 2011  . Anxiety     no med. in use for anxiety but pt. speaks openly of his stress & anxious feelings regarding impending surgery    .  ICD (implantable cardioverter-defibrillator) in place     CRT-D placed March, 2014 complete heart block and k dysfunction  . Ejection fraction < 50%     EF previously 30%  //   EF 30-35%, echo, July 14, 2012, severe diffuse hypokinesis, PA pressure 43 mm mercury  . Pacemaker   . Myocardial infarction   . Drug therapy     Hyperkalemia with spironolactone  . Automatic implantable cardioverter-defibrillator in situ    Past Surgical History  Procedure Laterality Date  . Tonsillectomy      as a child   . Colonoscopy    . Cardiac catheterization    . Abdominal aortic aneurysm repair      EVAR   . Pacemaker insertion  07-16-12    pacemaker/defibrilator  . Umbilical hernia repair N/A 05/14/2013    Procedure: LAPAROSCOPIC exploration and repair of hernia in abdominal ;  Surgeon: Adin Hector, MD;  Location: [facility];  Service: General;  Laterality: N/A;  . Insertion of mesh N/A 05/14/2013    Procedure: INSERTION OF MESH;  Surgeon: Adin Hector, MD;  Location: [facility];  Service: General;  Laterality: N/A;  . Hernia repair     Social History History  Substance Use Topics  . Smoking status:  Former Smoker -- 2.00 packs/day for 40 years    Types: Cigarettes    Quit date: 05/06/2004  . Smokeless tobacco: Never Used     Comment: quit about 7 years ago  . Alcohol Use: 1.2 oz/week    2 Cans of beer per week     Comment: beer or wine  occassionally   Family History Family History  Problem Relation Age of Onset  . Hypertension Mother   . Stroke Mother   . Diabetes Sister   . Heart disease Sister   . Hypertension Sister   . Colon cancer Neg Hx   . Prostate cancer Neg Hx   . Heart disease Father    Current Outpatient Prescriptions on File Prior to Visit  Medication Sig Dispense Refill  . amLODipine (NORVASC) 2.5 MG tablet Take 1 tablet (2.5 mg total) by mouth daily.  30 tablet  6  . aspirin EC 81 MG tablet Take 81 mg by mouth daily.      . carvedilol (COREG) 25 MG tablet Take 1 tablet  (25 mg total) by mouth 2 (two) times daily with a meal.  60 tablet  9  . glipiZIDE (GLUCOTROL) 5 MG tablet Take 5 mg by mouth 2 (two) times daily before a meal.      . lisinopril (PRINIVIL,ZESTRIL) 20 MG tablet Take 1 tablet (20 mg total) by mouth 2 (two) times daily.  180 tablet  3  . metFORMIN (GLUCOPHAGE) 500 MG tablet Take 2 tablets (1,000 mg total) by mouth 2 (two) times daily with a meal.  360 tablet  1  . Multiple Vitamin (MULTIVITAMIN) tablet Take 1 tablet by mouth daily.       Marland Kitchen oxycodone (OXY-IR) 5 MG capsule Take 5 mg by mouth every 4 (four) hours as needed for pain.      . sildenafil (REVATIO) 20 MG tablet Take 3-4 tablets (60-80 mg total) by mouth daily as needed.  50 tablet  2  . simvastatin (ZOCOR) 40 MG tablet Take 40 mg by mouth at bedtime.       No current facility-administered medications on file prior to visit.   Allergies  Allergen Reactions  . Aldactone [Spironolactone] Other (See Comments)    Hyperkalemia  . Codeine Hives     ROS: See HPI for pertinent positives and negatives.  Physical Examination  Filed Vitals:   01/05/14 0921  BP: 173/90  Pulse: 66  Resp: 16  Height: 6\' 3"  (1.905 m)  Weight: 186 lb (84.369 kg)  SpO2: 98%   Body mass index is 23.25 kg/(m^2).  General: A&O x 3, WD, tall male.  Pulmonary: Sym exp, good air movt, CTAB, no rales, rhonchi, or wheezing.   Cardiac: RRR, Nl S1, S2, no Murmurs appreciated, left side chest pacemaker/defibrillaor palpated subcutaneously.  Vascular: Vessel Right Left  Radial 2+Palpable 2+Palpable  Carotid  without bruit  without bruit  Aorta Not palpable N/A  Femoral 2+Palpable 2+Palpable  Popliteal Not palpable Not palpable  PT Not Palpable 2+Palpable  DP Not Palpable Not Palpable   Gastrointestinal: soft, NTND, -G/R, - HSM, - masses, - CVAT B.  Musculoskeletal: M/S 5/5 throughout, Extremities without ischemic changes.  Neurologic: CN 2-12 intact, Motor exam as listed above.  Non-Invasive Vascular  Imaging  EVAR Duplex (Date: 01/05/2014) ABDOMINAL AORTA DUPLEX EVALUATION - POST ENDOVASCULAR REPAIR    INDICATION: Follow-up endovascular aortic repair     PREVIOUS INTERVENTION(S): Endovascular aortic repair 12/03/2011    DUPLEX EXAM:      DIAMETER  AP (cm) DIAMETER TRANSVERSE (cm) VELOCITIES (cm/sec)  Aorta 4.1 4.3 77  Right Common Iliac   93  Left Common Iliac   138    Comparison Study  Ultrasound     Date DIAMETER AP (cm) DIAMETER TRANSVERSE (cm)  12/30/2012 4.9 5.0     ADDITIONAL FINDINGS: Technically difficult study due to bowel gas    IMPRESSION: Patent endovascular repair of abdominal aneurysm with a decrease in residual sac size surrounding the stent.    Compared to the previous exam:  Decrease in sac size.    Medical Decision Making  Luke [name] is a 68 y.o. male who presents s/p EVAR (Date: 12/03/2011).  Pt is asymptomatic with decrease in sac size.  I discussed with the patient the importance of surveillance of the endograft.  The next endograft duplex will be scheduled for 12 months. Will also check ABI's on his return since he has mild right calf claudication, no tissue loss.  The patient will follow up with [place] in 12 months with these studies.  I emphasized the importance of maximal medical management including strict control of blood pressure, blood glucose, and lipid levels, antiplatelet agents, obtaining regular exercise, and cessation of smoking.   The patient was given information about AAA including signs, symptoms, treatment, and how to minimize the risk of enlargement and rupture of aneurysms.    Thank you for allowing [place] to participate in this patient's care.  Clemon Chambers, RN, MSN, FNP-C [facility] Office: [phone]  Clinic Physician: Scot Dock  01/05/2014, 9:15 AM

## 2014-01-05 NOTE — Patient Instructions (Addendum)
Abdominal Aortic Aneurysm An aneurysm is a weakened or damaged part of an artery wall that bulges from the normal force of blood pumping through the body. An abdominal aortic aneurysm is an aneurysm that occurs in the lower part of the aorta, the main artery of the body.  The major concern with an abdominal aortic aneurysm is that it can enlarge and burst (rupture) or blood can flow between the layers of the wall of the aorta through a tear (aorticdissection). Both of these conditions can cause bleeding inside the body and can be life threatening unless diagnosed and treated promptly. CAUSES  The exact cause of an abdominal aortic aneurysm is unknown. Some contributing factors are:   A hardening of the arteries caused by the buildup of fat and other substances in the lining of a blood vessel (arteriosclerosis).  Inflammation of the walls of an artery (arteritis).   Connective tissue diseases, such as Marfan syndrome.   Abdominal trauma.   An infection, such as syphilis or staphylococcus, in the wall of the aorta (infectious aortitis) caused by bacteria. RISK FACTORS  Risk factors that contribute to an abdominal aortic aneurysm may include:  Age older than 60 years.   High blood pressure (hypertension).  Male gender.  Ethnicity (white race).  Obesity.  Family history of aneurysm (first degree relatives only).  Tobacco use. PREVENTION  The following healthy lifestyle habits may help decrease your risk of abdominal aortic aneurysm:  Quitting smoking. Smoking can raise your blood pressure and cause arteriosclerosis.  Limiting or avoiding alcohol.  Keeping your blood pressure, blood sugar level, and cholesterol levels within normal limits.  Decreasing your salt intake. In somepeople, too much salt can raise blood pressure and increase your risk of abdominal aortic aneurysm.  Eating a diet low in saturated fats and cholesterol.  Increasing your fiber intake by including  whole grains, vegetables, and fruits in your diet. Eating these foods may help lower blood pressure.  Maintaining a healthy weight.  Staying physically active and exercising regularly. SYMPTOMS  The symptoms of abdominal aortic aneurysm may vary depending on the size and rate of growth of the aneurysm.Most grow slowly and do not have any symptoms. When symptoms do occur, they may include:  Pain (abdomen, side, lower back, or groin). The pain may vary in intensity. A sudden onset of severe pain may indicate that the aneurysm has ruptured.  Feeling full after eating only small amounts of food.  Nausea or vomiting or both.  Feeling a pulsating lump in the abdomen.  Feeling faint or passing out. DIAGNOSIS  Since most unruptured abdominal aortic aneurysms have no symptoms, they are often discovered during diagnostic exams for other conditions. An aneurysm may be found during the following procedures:  Ultrasonography (A one-time screening for abdominal aortic aneurysm by ultrasonography is also recommended for all men aged 65-75 years who have ever smoked).  X-ray exams.  A computed tomography (CT).  Magnetic resonance imaging (MRI).  Angiography or arteriography. TREATMENT  Treatment of an abdominal aortic aneurysm depends on the size of your aneurysm, your age, and risk factors for rupture. Medication to control blood pressure and pain may be used to manage aneurysms smaller than 6 cm. Regular monitoring for enlargement may be recommended by your caregiver if:  The aneurysm is 3-4 cm in size (an annual ultrasonography may be recommended).  The aneurysm is 4-4.5 cm in size (an ultrasonography every 6 months may be recommended).  The aneurysm is larger than 4.5 cm in   size (your caregiver may ask that you be examined by a vascular surgeon). If your aneurysm is larger than 6 cm, surgical repair may be recommended. There are two main methods for repair of an aneurysm:   Endovascular  repair (a minimally invasive surgery). This is done most often.  Open repair. This method is used if an endovascular repair is not possible. Document Released: 01/30/2005 Document Revised: 08/17/2012 Document Reviewed: 05/22/2012 ExitCare Patient Information 2015 [employer]. This information is not intended to replace advice given to you by your health care provider. Make sure you discuss any questions you have with your health care provider.   Peripheral Vascular Disease Peripheral Vascular Disease (PVD), also called Peripheral Arterial Disease (PAD), is a circulation problem caused by cholesterol (atherosclerotic plaque) deposits in the arteries. PVD commonly occurs in the lower extremities (legs) but it can occur in other areas of the body, such as your arms. The cholesterol buildup in the arteries reduces blood flow which can cause pain and other serious problems. The presence of PVD can place a person at risk for Coronary Artery Disease (CAD).  CAUSES  Causes of PVD can be many. It is usually associated with more than one risk factor such as:   High Cholesterol.  Smoking.  Diabetes.  Lack of exercise or inactivity.  High blood pressure (hypertension).  Obesity.  Family history. SYMPTOMS   When the lower extremities are affected, patients with PVD may experience:  Leg pain with exertion or physical activity. This is called INTERMITTENT CLAUDICATION. This may present as cramping or numbness with physical activity. The location of the pain is associated with the level of blockage. For example, blockage at the abdominal level (distal abdominal aorta) may result in buttock or hip pain. Lower leg arterial blockage may result in calf pain.  As PVD becomes more severe, pain can develop with less physical activity.  In people with severe PVD, leg pain may occur at rest.  Other PVD signs and symptoms:  Leg numbness or weakness.  Coldness in the affected leg or foot, especially  when compared to the other leg.  A change in leg color.  Patients with significant PVD are more prone to ulcers or sores on toes, feet or legs. These may take longer to heal or may reoccur. The ulcers or sores can become infected.  If signs and symptoms of PVD are ignored, gangrene may occur. This can result in the loss of toes or loss of an entire limb.  Not all leg pain is related to PVD. Other medical conditions can cause leg pain such as:  Blood clots (embolism) or Deep Vein Thrombosis.  Inflammation of the blood vessels (vasculitis).  Spinal stenosis. DIAGNOSIS  Diagnosis of PVD can involve several different types of tests. These can include:  Pulse Volume Recording Method (PVR). This test is simple, painless and does not involve the use of X-rays. PVR involves measuring and comparing the blood pressure in the arms and legs. An ABI (Ankle-Brachial Index) is calculated. The normal ratio of blood pressures is 1. As this number becomes smaller, it indicates more severe disease.  < 0.95 - indicates significant narrowing in one or more leg vessels.  <0.8 - there will usually be pain in the foot, leg or buttock with exercise.  <0.4 - will usually have pain in the legs at rest.  <0.25 - usually indicates limb threatening PVD.  Doppler detection of pulses in the legs. This test is painless and checks to see if   you have a pulses in your legs/feet.  A dye or contrast material (a substance that highlights the blood vessels so they show up on x-ray) may be given to help your caregiver better see the arteries for the following tests. The dye is eliminated from your body by the kidney's. Your caregiver may order blood work to check your kidney function and other laboratory values before the following tests are performed:  Magnetic Resonance Angiography (MRA). An MRA is a picture study of the blood vessels and arteries. The MRA machine uses a large magnet to produce images of the blood  vessels.  Computed Tomography Angiography (CTA). A CTA is a specialized x-ray that looks at how the blood flows in your blood vessels. An IV may be inserted into your arm so contrast dye can be injected.  Angiogram. Is a procedure that uses x-rays to look at your blood vessels. This procedure is minimally invasive, meaning a small incision (cut) is made in your groin. A small tube (catheter) is then inserted into the artery of your groin. The catheter is guided to the blood vessel or artery your caregiver wants to examine. Contrast dye is injected into the catheter. X-rays are then taken of the blood vessel or artery. After the images are obtained, the catheter is taken out. TREATMENT  Treatment of PVD involves many interventions which may include:  Lifestyle changes:  Quitting smoking.  Exercise.  Following a low fat, low cholesterol diet.  Control of diabetes.  Foot care is very important to the PVD patient. Good foot care can help prevent infection.  Medication:  Cholesterol-lowering medicine.  Blood pressure medicine.  Anti-platelet drugs.  Certain medicines may reduce symptoms of Intermittent Claudication.  Interventional/Surgical options:  Angioplasty. An Angioplasty is a procedure that inflates a balloon in the blocked artery. This opens the blocked artery to improve blood flow.  Stent Implant. A wire mesh tube (stent) is placed in the artery. The stent expands and stays in place, allowing the artery to remain open.  Peripheral Bypass Surgery. This is a surgical procedure that reroutes the blood around a blocked artery to help improve blood flow. This type of procedure may be performed if Angioplasty or stent implants are not an option. SEEK IMMEDIATE MEDICAL CARE IF:   You develop pain or numbness in your arms or legs.  Your arm or leg turns cold, becomes blue in color.  You develop redness, warmth, swelling and pain in your arms or legs. MAKE SURE YOU:    Understand these instructions.  Will watch your condition.  Will get help right away if you are not doing well or get worse. Document Released: 05/30/2004 Document Revised: 07/15/2011 Document Reviewed: 04/26/2008 ExitCare Patient Information 2015 [employer]. This information is not intended to replace advice given to you by your health care provider. Make sure you discuss any questions you have with your health care provider.  

## 2014-01-05 NOTE — Addendum Note (Signed)
Addended by: Mena Goes on: 01/05/2014 03:40 PM   Modules accepted: Orders

## 2014-01-19 DIAGNOSIS — M4712 Other spondylosis with myelopathy, cervical region: Secondary | ICD-10-CM | POA: Diagnosis not present

## 2014-01-19 DIAGNOSIS — M488X2 Other specified spondylopathies, cervical region: Secondary | ICD-10-CM | POA: Diagnosis not present

## 2014-01-19 DIAGNOSIS — IMO0002 Reserved for concepts with insufficient information to code with codable children: Secondary | ICD-10-CM | POA: Diagnosis not present

## 2014-01-19 DIAGNOSIS — M545 Low back pain, unspecified: Secondary | ICD-10-CM | POA: Diagnosis not present

## 2014-01-19 DIAGNOSIS — M4802 Spinal stenosis, cervical region: Secondary | ICD-10-CM | POA: Diagnosis not present

## 2014-01-19 DIAGNOSIS — M948X9 Other specified disorders of cartilage, unspecified sites: Secondary | ICD-10-CM | POA: Diagnosis not present

## 2014-01-24 ENCOUNTER — Other Ambulatory Visit: Payer: Self-pay | Admitting: Family Medicine

## 2014-01-25 DIAGNOSIS — M4712 Other spondylosis with myelopathy, cervical region: Secondary | ICD-10-CM | POA: Diagnosis not present

## 2014-01-25 DIAGNOSIS — Z5189 Encounter for other specified aftercare: Secondary | ICD-10-CM | POA: Diagnosis not present

## 2014-01-25 DIAGNOSIS — S14121A Central cord syndrome at C1 level of cervical spinal cord, initial encounter: Secondary | ICD-10-CM | POA: Diagnosis not present

## 2014-02-04 ENCOUNTER — Other Ambulatory Visit: Payer: Self-pay | Admitting: Family Medicine

## 2014-02-04 ENCOUNTER — Other Ambulatory Visit: Payer: Self-pay | Admitting: *Deleted

## 2014-02-04 MED ORDER — LISINOPRIL 20 MG PO TABS: 20.0000 mg | ORAL_TABLET | Freq: Two times a day (BID) | ORAL | Status: DC

## 2014-02-08 ENCOUNTER — Encounter: Payer: Self-pay | Admitting: Cardiology

## 2014-02-11 ENCOUNTER — Ambulatory Visit (INDEPENDENT_AMBULATORY_CARE_PROVIDER_SITE_OTHER): Payer: Medicare Other | Admitting: Cardiology

## 2014-02-11 ENCOUNTER — Encounter: Payer: Self-pay | Admitting: Cardiology

## 2014-02-11 ENCOUNTER — Encounter: Payer: Self-pay | Admitting: *Deleted

## 2014-02-11 VITALS — BP 164/78 | HR 67 | Ht 75.0 in | Wt 188.4 lb

## 2014-02-11 DIAGNOSIS — I255 Ischemic cardiomyopathy: Secondary | ICD-10-CM

## 2014-02-11 DIAGNOSIS — Z8679 Personal history of other diseases of the circulatory system: Secondary | ICD-10-CM

## 2014-02-11 DIAGNOSIS — Z4502 Encounter for adjustment and management of automatic implantable cardiac defibrillator: Secondary | ICD-10-CM

## 2014-02-11 DIAGNOSIS — Z01810 Encounter for preprocedural cardiovascular examination: Secondary | ICD-10-CM

## 2014-02-11 DIAGNOSIS — Z9889 Other specified postprocedural states: Secondary | ICD-10-CM

## 2014-02-11 DIAGNOSIS — I2510 Atherosclerotic heart disease of native coronary artery without angina pectoris: Secondary | ICD-10-CM | POA: Diagnosis not present

## 2014-02-11 NOTE — Progress Notes (Signed)
Patient ID: Luke [name], male   DOB: 10-Dec-1945, 69 y.o.   MRN: [id number]    HPI  Patient is seen today to followup his coronary disease. He's also seen for preop cardiac clearance for possible neurosurgery to the neck in January, 2015. I saw him last April, 2015. His coronary disease and cardiomyopathy were stable. At that time there was consideration for neurosurgery. I did clear him at that time. Ultimately he decided to delay the surgery to get further opinions. He tells me today that he went to [facility] and that their opinion was the same as that of Dr. Ronnald Ramp. He does need surgery. He is planning to be back in touch with Dr. Ronnald Ramp soon for consideration of the surgery in January, 2015.  He's not having any chest pain. He is not having any significant CHF.   The patient exercises regularly. Allergies  Allergen Reactions  . Aldactone [Spironolactone] Other (See Comments)    Hyperkalemia  . Codeine Hives    Current Outpatient Prescriptions  Medication Sig Dispense Refill  . amLODipine (NORVASC) 2.5 MG tablet Take 1 tablet (2.5 mg total) by mouth daily.  30 tablet  6  . aspirin EC 81 MG tablet Take 81 mg by mouth daily.      . carvedilol (COREG) 25 MG tablet Take 1 tablet (25 mg total) by mouth 2 (two) times daily with a meal.  60 tablet  9  . glipiZIDE (GLUCOTROL) 5 MG tablet Take 5 mg by mouth 2 (two) times daily before a meal.      . lisinopril (PRINIVIL,ZESTRIL) 20 MG tablet Take 1 tablet (20 mg total) by mouth 2 (two) times daily.  180 tablet  0  . metFORMIN (GLUCOPHAGE) 500 MG tablet take 2 tablets by mouth twice a day with food  360 tablet  1  . Multiple Vitamin (MULTIVITAMIN) tablet Take 1 tablet by mouth daily.       Marland Kitchen oxycodone (OXY-IR) 5 MG capsule Take 5 mg by mouth every 4 (four) hours as needed for pain.      . sildenafil (REVATIO) 20 MG tablet Take 3-4 tablets (60-80 mg total) by mouth daily as needed.  50 tablet  2  . simvastatin (ZOCOR) 40 MG tablet Take 40 mg by mouth at  bedtime.       No current facility-administered medications for this visit.    History   Social History  . Marital Status: Married    Spouse Name: N/A    Number of Children: N/A  . Years of Education: N/A   Occupational History  . Not on file.   Social History Main Topics  . Smoking status: Former Smoker -- 2.00 packs/day for 40 years    Types: Cigarettes    Quit date: 05/06/2004  . Smokeless tobacco: Never Used     Comment: quit about 7 years ago  . Alcohol Use: 1.2 oz/week    2 Cans of beer per week     Comment: beer or wine  occassionally  . Drug Use: No  . Sexual Activity: Not on file   Other Topics Concern  . Not on file   Social History Narrative   [place] vet   retired    Family History  Problem Relation Age of Onset  . Hypertension Mother   . Stroke Mother   . Hyperlipidemia Mother   . Diabetes Sister   . Heart disease Sister     Before age 59  . Hypertension Sister   .  Hyperlipidemia Sister   . Heart attack Sister   . Colon cancer Neg Hx   . Prostate cancer Neg Hx   . Cancer Father     Lung  . Hypertension Son     Past Medical History  Diagnosis Date  . Diabetes mellitus     type II  . PAD (peripheral artery disease)   . AAA (abdominal aortic aneurysm) 2011    Per vascular surgery  . HLD (hyperlipidemia)   . Hypertension     white coat HTN-- often elevated in office and controlled on outside checks.  . Low testosterone     Hx of  . Cardiomyopathy     Nuclear, October 09, 2011, EF 30%, multiple focal wall motion abnormalities  . Ejection fraction < 50%     EF 30%, nuclear, October 09, 2011  . LBBB (left bundle branch block)     LBBB on EKG October 11, 2011,  no prior EKG has been done  . CAD (coronary artery disease)     Presumed CAD with nuclear scan October 09, 2011,  large anteroseptal MI and inferior MI. Catheterization scheduled October 15, 2011  . Anxiety     no med. in use for anxiety but pt. speaks openly of his stress & anxious feelings  regarding impending surgery    . ICD (implantable cardioverter-defibrillator) in place     CRT-D placed March, 2014 complete heart block and k dysfunction  . Ejection fraction < 50%     EF previously 30%  //   EF 30-35%, echo, July 14, 2012, severe diffuse hypokinesis, PA pressure 43 mm mercury  . Pacemaker   . Myocardial infarction   . Drug therapy     Hyperkalemia with spironolactone  . Automatic implantable cardioverter-defibrillator in situ   . Fall due to ice or snow Feb.  19, 2015  . Atrial fibrillation     Past Surgical History  Procedure Laterality Date  . Tonsillectomy      as a child   . Colonoscopy    . Cardiac catheterization    . Abdominal aortic aneurysm repair      EVAR   . Pacemaker insertion  07-16-12    pacemaker/defibrilator  . Umbilical hernia repair N/A 05/14/2013    Procedure: LAPAROSCOPIC exploration and repair of hernia in abdominal ;  Surgeon: Adin Hector, MD;  Location: [place];  Service: General;  Laterality: N/A;  . Insertion of mesh N/A 05/14/2013    Procedure: INSERTION OF MESH;  Surgeon: Adin Hector, MD;  Location: [place];  Service: General;  Laterality: N/A;  . Hernia repair  Jan. 9, 2015    Patient Active Problem List   Diagnosis Date Noted  . Aftercare following surgery of the circulatory system, [place] 01/05/2014  . Preop cardiovascular exam 08/13/2013  . Central cord syndrome 06/24/2013  . Biventricular defibrillator-St. Jude 04/20/2013  . Drug therapy   . Umbilical hernia, incarcerated s/p lap repair with mesh 05/14/2013 03/19/2013  . Abdominal aneurysm without mention of rupture 12/30/2012  . HLD (hyperlipidemia) 09/16/2012  . Ejection fraction < 50%   . Complete heart block 07/14/2012  . SK (seborrheic keratosis) 02/12/2012  . Shoulder pain 11/08/2011  . CAD (coronary artery disease)   . Ischemic cardiomyopathy   . LBBB (left bundle branch block)   . PVD (peripheral vascular disease) 10/08/2010  . ORGANIC IMPOTENCE 07/05/2010  .  ESSENTIAL HYPERTENSION, BENIGN 02/27/2010  . Type 2 diabetes mellitus with vascular disease 11/16/2009  ROS   Patient denies fever, chills, headache, sweats, rash, change in vision, change in hearing, chest pain, cough, nausea vomiting, urinary symptoms. All other systems are reviewed and are negative.  PHYSICAL EXAM  Patient is oriented to person time and place. Affect is normal. Head is atraumatic. Sclera and conjunctiva are normal. There is no jugulovenous distention. Lungs are clear. Respiratory effort is nonlabored. Cardiac exam reveals S1 and S2. The abdomen is soft. There is no peripheral edema. There no musculoskeletal deformities. There are no skin rashes.  Filed Vitals:   02/11/14 0910  BP: 164/78  Pulse: 67  Height: 6\' 3"  (1.905 m)  Weight: 188 lb 6.4 oz (85.458 kg)  SpO2: 99%     ASSESSMENT & PLAN

## 2014-02-11 NOTE — Assessment & Plan Note (Signed)
I have reviewed all aspects of his care. I feel that his cardiac status is stable for him to have surgery for his central cord syndrome. He will be checking back with his neurosurgical team soon to look into this. He is cleared for this from the cardiac viewpoint.

## 2014-02-11 NOTE — Assessment & Plan Note (Signed)
The patient's defibrillator is followed carefully by our electrophysiology team. No further workup.

## 2014-02-11 NOTE — Assessment & Plan Note (Signed)
Coronary disease is stable. His last cath was June, 2013. He has moderate disease. He is stable. He's not having any significant symptoms.

## 2014-02-11 NOTE — Patient Instructions (Signed)
**Note De-identified [name] Obfuscation** Your physician recommends that you continue on your current medications as directed. Please refer to the Current Medication list given to you today.  Your physician wants you to follow-up in: 6 months. You will receive a reminder letter in the mail two months in advance. If you don't receive a letter, please call our office to schedule the follow-up appointment.  

## 2014-02-11 NOTE — Assessment & Plan Note (Signed)
He is on appropriate medications. No change in therapy.

## 2014-02-11 NOTE — Assessment & Plan Note (Signed)
The patient underwent vascular procedure to repair his abdominal aortic aneurysm in July, 2013. He said very careful followup by vascular surgery including a recent visit September, 2015. No further workup is needed.

## 2014-02-16 ENCOUNTER — Telehealth: Payer: Self-pay | Admitting: *Deleted

## 2014-02-16 MED ORDER — GLUCOSE BLOOD VI STRP: ORAL_STRIP | Status: DC

## 2014-02-21 NOTE — Telephone Encounter (Signed)
Pt left v/m requesting status of test strip refill; pt is out of test strips; spoke with Ronalee Belts at [place] aid and [place] form faxed x 2 to office to be filled out since pt is on Medicare part D has to have [place] form completed.Last time faxed was 02/21/14.pt request cb when form faxed to [facility] aid.

## 2014-02-22 NOTE — Telephone Encounter (Signed)
Fax form received, completed, and placed in your inbox to sign. Please return form to me to fax, and call pt.

## 2014-02-23 NOTE — Telephone Encounter (Signed)
Form done, thanks ?

## 2014-02-23 NOTE — Telephone Encounter (Addendum)
Pt left v/m requesting cb when test strips can be picked up at pharmacy.

## 2014-02-24 NOTE — Telephone Encounter (Signed)
Forms faxed to pharmacy yesterday, pt notified today.

## 2014-03-02 ENCOUNTER — Other Ambulatory Visit: Payer: Self-pay | Admitting: Family Medicine

## 2014-03-02 NOTE — Telephone Encounter (Signed)
Sent. Thanks.   

## 2014-03-02 NOTE — Telephone Encounter (Signed)
Received refill request electronically from pharmacy. Last office visit 12/02/13, last refill 09/15/13 #50/2 refills. Is it okay to refill medication?

## 2014-03-08 ENCOUNTER — Other Ambulatory Visit: Payer: Self-pay

## 2014-03-08 MED ORDER — CARVEDILOL 25 MG PO TABS: 25.0000 mg | ORAL_TABLET | Freq: Two times a day (BID) | ORAL | Status: DC

## 2014-03-08 MED ORDER — AMLODIPINE BESYLATE 2.5 MG PO TABS: 2.5000 mg | ORAL_TABLET | Freq: Every day | ORAL | Status: DC

## 2014-03-10 ENCOUNTER — Other Ambulatory Visit: Payer: Self-pay

## 2014-03-16 ENCOUNTER — Encounter: Payer: Self-pay | Admitting: *Deleted

## 2014-03-22 ENCOUNTER — Other Ambulatory Visit (INDEPENDENT_AMBULATORY_CARE_PROVIDER_SITE_OTHER): Payer: Medicare Other

## 2014-03-22 ENCOUNTER — Telehealth: Payer: Self-pay | Admitting: Cardiology

## 2014-03-22 DIAGNOSIS — E1151 Type 2 diabetes mellitus with diabetic peripheral angiopathy without gangrene: Secondary | ICD-10-CM | POA: Diagnosis not present

## 2014-03-22 DIAGNOSIS — E1159 Type 2 diabetes mellitus with other circulatory complications: Secondary | ICD-10-CM

## 2014-03-22 LAB — HEMOGLOBIN A1C: Hgb A1c MFr Bld: 7.4 % — ABNORMAL HIGH (ref 4.6–6.5)

## 2014-03-22 NOTE — Telephone Encounter (Signed)
Informed pt that reason he received letter was b/c he was passed due to see MD. He verbalized understanding and spoke with EP scheduler to schedule an appt with MD.

## 2014-03-22 NOTE — Telephone Encounter (Signed)
LMOVM for pt to return call 

## 2014-03-22 NOTE — Telephone Encounter (Signed)
New Message  Pt called to discuss letter he received stating he hasnt had a device check in some time. Pt reports he has had remote checks so he is not sure what the letter is about. Please call pt back to discuss further.

## 2014-03-25 ENCOUNTER — Ambulatory Visit (INDEPENDENT_AMBULATORY_CARE_PROVIDER_SITE_OTHER): Payer: Medicare Other | Admitting: Family Medicine

## 2014-03-25 ENCOUNTER — Encounter: Payer: Self-pay | Admitting: Family Medicine

## 2014-03-25 VITALS — BP 164/96 | HR 74 | Temp 97.6°F | Wt 191.0 lb

## 2014-03-25 DIAGNOSIS — E1159 Type 2 diabetes mellitus with other circulatory complications: Secondary | ICD-10-CM

## 2014-03-25 DIAGNOSIS — I2510 Atherosclerotic heart disease of native coronary artery without angina pectoris: Secondary | ICD-10-CM

## 2014-03-25 DIAGNOSIS — E1151 Type 2 diabetes mellitus with diabetic peripheral angiopathy without gangrene: Secondary | ICD-10-CM

## 2014-03-25 DIAGNOSIS — Z23 Encounter for immunization: Secondary | ICD-10-CM

## 2014-03-25 MED ORDER — SILDENAFIL CITRATE 20 MG PO TABS: ORAL_TABLET | ORAL | Status: DC

## 2014-03-25 NOTE — Progress Notes (Signed)
Pre visit review using our clinic review tool, if applicable. No additional management support is needed unless otherwise documented below in the visit note.  He has gotten a second opinion re: back surgery and he'll proceed in 2016 in [place].  He was reassured with the agreement in the second opinion.  He still has hand sensation changes.   His BP has been controlled at home, 123/72, this AM.  He is back on a good cardio routine, treadmill, stair stepper, bicycle.    Diabetes:  Using medications without difficulties: yes Hypoglycemic episodes: no Hyperglycemic episodes: no Feet problems: some loss of feeling in feet, at baseline, R>L.  Blood Sugars averaging: 90-120s usually in AM, diet variable.   eye exam within last year: yes A1c improved. D/w pt.   Meds, vitals, and allergies reviewed.   ROS: See HPI.  Otherwise negative.    GEN: nad, alert and oriented HEENT: mucous membranes moist NECK: supple w/o LA CV: rrr. PULM: ctab, no inc wob ABD: soft, +bs EXT: no edema SKIN: no acute rash  Diabetic foot exam: Normal inspection No skin breakdown No calluses  Dec DP pulses at baseline Normal sensation to light touch and monofilament Nails normal

## 2014-03-25 NOTE — Patient Instructions (Signed)
I would get the other pneumonia shot in about 1 month (prevnar, pneumonia-13).  We should get together after your surgery, 3 months from now.  Take care.  Glad to see you.

## 2014-03-27 NOTE — Assessment & Plan Note (Signed)
A1c improved, I expect it to improve more with continued exercise.  He agrees.  No change in meds.  BP controlled at home.  Continue as is.  We'll get back together after his surgery.  He agrees.

## 2014-04-14 ENCOUNTER — Encounter (HOSPITAL_COMMUNITY): Payer: Self-pay | Admitting: Vascular Surgery

## 2014-04-15 ENCOUNTER — Encounter: Payer: Self-pay | Admitting: Internal Medicine

## 2014-04-15 ENCOUNTER — Ambulatory Visit (INDEPENDENT_AMBULATORY_CARE_PROVIDER_SITE_OTHER): Payer: Medicare Other | Admitting: Internal Medicine

## 2014-04-15 VITALS — BP 182/90 | HR 72 | Ht 75.0 in | Wt 190.4 lb

## 2014-04-15 DIAGNOSIS — Z4502 Encounter for adjustment and management of automatic implantable cardiac defibrillator: Secondary | ICD-10-CM | POA: Diagnosis not present

## 2014-04-15 DIAGNOSIS — I2510 Atherosclerotic heart disease of native coronary artery without angina pectoris: Secondary | ICD-10-CM

## 2014-04-15 DIAGNOSIS — R42 Dizziness and giddiness: Secondary | ICD-10-CM

## 2014-04-15 DIAGNOSIS — I442 Atrioventricular block, complete: Secondary | ICD-10-CM | POA: Diagnosis not present

## 2014-04-15 DIAGNOSIS — I5022 Chronic systolic (congestive) heart failure: Secondary | ICD-10-CM | POA: Diagnosis not present

## 2014-04-15 DIAGNOSIS — I255 Ischemic cardiomyopathy: Secondary | ICD-10-CM | POA: Diagnosis not present

## 2014-04-15 LAB — MDC_IDC_ENUM_SESS_TYPE_INCLINIC
Battery Remaining Longevity: 64.8 mo
Brady Statistic RA Percent Paced: 17 %
Brady Statistic RV Percent Paced: 99.96 %
Date Time Interrogation Session: 20151211165939
HighPow Impedance: 78.75 Ohm
Implantable Pulse Generator Serial Number: 7097007
Lead Channel Impedance Value: 1237.5 Ohm
Lead Channel Impedance Value: 462.5 Ohm
Lead Channel Impedance Value: 537.5 Ohm
Lead Channel Pacing Threshold Amplitude: 0.75 V
Lead Channel Pacing Threshold Amplitude: 0.75 V
Lead Channel Pacing Threshold Amplitude: 0.875 V
Lead Channel Pacing Threshold Amplitude: 1 V
Lead Channel Pacing Threshold Amplitude: 1.5 V
Lead Channel Pacing Threshold Amplitude: 1.5 V
Lead Channel Pacing Threshold Pulse Width: 0.5 ms
Lead Channel Pacing Threshold Pulse Width: 0.5 ms
Lead Channel Pacing Threshold Pulse Width: 0.5 ms
Lead Channel Pacing Threshold Pulse Width: 0.5 ms
Lead Channel Pacing Threshold Pulse Width: 1 ms
Lead Channel Pacing Threshold Pulse Width: 1 ms
Lead Channel Sensing Intrinsic Amplitude: 11.9 mV
Lead Channel Sensing Intrinsic Amplitude: 4.7 mV
Lead Channel Setting Pacing Amplitude: 2 V
Lead Channel Setting Pacing Amplitude: 2 V
Lead Channel Setting Pacing Amplitude: 2.5 V
Lead Channel Setting Pacing Pulse Width: 0.5 ms
Lead Channel Setting Pacing Pulse Width: 1 ms
Lead Channel Setting Sensing Sensitivity: 0.5 mV
Zone Setting Detection Interval: 250 ms
Zone Setting Detection Interval: 300 ms

## 2014-04-15 NOTE — Progress Notes (Signed)
Patient Care Team: Tonia Ghent, MD as PCP - General Angelia Mould, MD as Attending Physician (Vascular Surgery) Carlena Bjornstad, MD (Cardiology) Arta Silence, MD as Consulting Physician (Gastroenterology) Michael Boston, MD as Consulting Physician (General Surgery) Eustace Moore, MD as Consulting Physician (Neurosurgery)   HPI  Luke [name] is a 68 y.o. male Seen in followup for an CRT-  ICD implanted for intermittent complete heart block in the setting of ischemic cardiomyopathy and left bundle branch block.  LHC 62263F CAD Left anterior descending (LAD): focal 70-80% proximal. Long 40% mid vessel. Right coronary artery (RCA): Diffusely diseased. 50-60% mid vessel, 70% distal, 70% at bifurcation of PDA/PLOM.   LV Ejection Fraction: 30%. He underwent catheterization which confirmed severe LV dysfunction with moderate to severe 2 vessel disease not thought to be appropriate for intervention.   He anticipates neurosurgery in a few months  He has neuropathy in legs and hands releted to a fall  He also struggles with post exercise lightheadedness  He is much improved following CRT-D implantation.  He has been intolerant of Aldactone in the past because of hyperkalemia  Past Medical History  Diagnosis Date  . Diabetes mellitus     type II  . PAD (peripheral artery disease)   . AAA (abdominal aortic aneurysm) 2011    Per vascular surgery  . HLD (hyperlipidemia)   . Hypertension     white coat HTN-- often elevated in office and controlled on outside checks.  . Low testosterone     Hx of  . Cardiomyopathy     Nuclear, October 09, 2011, EF 30%, multiple focal wall motion abnormalities  . Ejection fraction < 50%     EF 30%, nuclear, October 09, 2011  . LBBB (left bundle branch block)     LBBB on EKG October 11, 2011,  no prior EKG has been done  . CAD (coronary artery disease)     Presumed CAD with nuclear scan October 09, 2011,  large anteroseptal MI and inferior MI.  Catheterization scheduled October 15, 2011  . Anxiety     no med. in use for anxiety but pt. speaks openly of his stress & anxious feelings regarding impending surgery    . ICD (implantable cardioverter-defibrillator) in place     CRT-D placed March, 2014 complete heart block and k dysfunction  . Ejection fraction < 50%     EF previously 30%  //   EF 30-35%, echo, July 14, 2012, severe diffuse hypokinesis, PA pressure 43 mm mercury  . Pacemaker   . Myocardial infarction   . Drug therapy     Hyperkalemia with spironolactone  . Automatic implantable cardioverter-defibrillator in situ   . Fall due to ice or snow Feb.  19, 2015  . Atrial fibrillation     Past Surgical History  Procedure Laterality Date  . Tonsillectomy      as a child   . Colonoscopy    . Cardiac catheterization    . Abdominal aortic aneurysm repair      EVAR   . Pacemaker insertion  07-16-12    pacemaker/defibrilator  . Umbilical hernia repair N/A 05/14/2013    Procedure: LAPAROSCOPIC exploration and repair of hernia in abdominal ;  Surgeon: Adin Hector, MD;  Location: [place];  Service: General;  Laterality: N/A;  . Insertion of mesh N/A 05/14/2013    Procedure: INSERTION OF MESH;  Surgeon: Adin Hector, MD;  Location: [place];  Service: General;  Laterality: N/A;  . Hernia repair  Jan. 9, 2015  . Abdominal aortagram N/A 10/28/2011    Procedure: ABDOMINAL [name];  Surgeon: Angelia Mould, MD;  Location: [facility] CATH LAB;  Service: Cardiovascular;  Laterality: N/A;  . Lower extremity angiogram Bilateral 10/28/2011    Procedure: LOWER EXTREMITY ANGIOGRAM;  Surgeon: Angelia Mould, MD;  Location: [facility] CATH LAB;  Service: Cardiovascular;  Laterality: Bilateral;  . Bi-ventricular implantable cardioverter defibrillator N/A 07/16/2012    Procedure: BI-VENTRICULAR IMPLANTABLE CARDIOVERTER DEFIBRILLATOR  (CRT-D);  Surgeon: Deboraha Sprang, MD;  Location: [facility] CATH LAB;  Service: Cardiovascular;  Laterality: N/A;     Current Outpatient Prescriptions  Medication Sig Dispense Refill  . amLODipine (NORVASC) 2.5 MG tablet Take 1 tablet (2.5 mg total) by mouth daily. 30 tablet 6  . aspirin EC 81 MG tablet Take 81 mg by mouth daily.    . carvedilol (COREG) 25 MG tablet Take 1 tablet (25 mg total) by mouth 2 (two) times daily with a meal. 60 tablet 9  . glipiZIDE (GLUCOTROL) 5 MG tablet Take 5 mg by mouth 2 (two) times daily before a meal.    . glucose blood (ACCU-CHEK AVIVA PLUS) test strip Use to check blood sugar three times a day, 2 hours after meals. DX: E11.51 300 each 1  . lisinopril (PRINIVIL,ZESTRIL) 20 MG tablet Take 1 tablet (20 mg total) by mouth 2 (two) times daily. 180 tablet 0  . metFORMIN (GLUCOPHAGE) 500 MG tablet take 2 tablets by mouth twice a day with food (Patient taking differently: take 1 tablet by mouth twice a day with food) 360 tablet 1  . Multiple Vitamin (MULTIVITAMIN) tablet Take 1 tablet by mouth daily.     . sildenafil (REVATIO) 20 MG tablet TAKE THREE TO FOUR TABLETS BY MOUTH DAILY AS NEEDED (Patient taking differently: Take 20 mg by mouth. TAKE THREE TO FOUR TABLETS BY MOUTH DAILY AS NEEDED, for neuropathy in feet.Marland Kitchen)    . simvastatin (ZOCOR) 40 MG tablet Take 40 mg by mouth at bedtime.     No current facility-administered medications for this visit.    Allergies  Allergen Reactions  . Aldactone [Spironolactone] Other (See Comments)    Hyperkalemia  . Codeine Hives    Review of Systems negative except from HPI and PMH  Physical Exam BP 182/90 mmHg  Pulse 72  Ht 6\' 3"  (1.905 m)  Wt 190 lb 6.4 oz (86.365 kg)  BMI 23.80 kg/m2 Well developed and nourished in no acute distress HENT normal Neck supple with JVP-flat Clear Device pocket well healed; without hematoma or erythema.  There is no tethering there is atrophy Regular rate and rhythm, no murmurs or gallops Abd-soft with active BS No Clubbing cyanosis edema Skin-warm and dry A & Oriented  Grossly normal  sensory and motor function   ECG demonstrates P-synchronous/ AV  pacing with CRT pacing  Assessment and  Plan  Iscehmic Cardiomyopathy Without symptoms of ischemia  Chronic HF systolic  Euvolemic continue current meds  Orthostatic lightheadedness  Implantable Defibrillator CRT  Medtronic The patient's device was interrogated.  The information was reviewed. No changes were made in the programming.     meds stable  We discussed the mechanism of OI and ppost exercise lightheadedness   We spent more than 50% of our >25 min visit in face to face counseling regarding the above

## 2014-04-15 NOTE — Patient Instructions (Signed)

## 2014-05-16 DIAGNOSIS — M4712 Other spondylosis with myelopathy, cervical region: Secondary | ICD-10-CM | POA: Diagnosis not present

## 2014-05-18 ENCOUNTER — Other Ambulatory Visit: Payer: Self-pay | Admitting: Family Medicine

## 2014-05-18 NOTE — Telephone Encounter (Signed)
Received refill request electronically. Last office visit 03/25/14. See drug warning with Amlodipine and Simvastatin. Is it okay to refill medication?

## 2014-05-19 ENCOUNTER — Other Ambulatory Visit: Payer: Self-pay

## 2014-05-19 MED ORDER — LISINOPRIL 20 MG PO TABS: 20.0000 mg | ORAL_TABLET | Freq: Two times a day (BID) | ORAL | Status: DC

## 2014-05-19 NOTE — Telephone Encounter (Signed)
Okay to continue, has tolerated.

## 2014-05-30 ENCOUNTER — Other Ambulatory Visit: Payer: Self-pay | Admitting: Neurological Surgery

## 2014-05-31 ENCOUNTER — Encounter (HOSPITAL_COMMUNITY)
Admission: RE | Admit: 2014-05-31 | Discharge: 2014-05-31 | Disposition: A | Discharge status: Home / Self Care | Payer: Medicare Other | Source: Ambulatory Visit | Attending: Neurological Surgery | Admitting: Neurological Surgery

## 2014-05-31 ENCOUNTER — Encounter (HOSPITAL_COMMUNITY): Payer: Self-pay

## 2014-05-31 DIAGNOSIS — Z01812 Encounter for preprocedural laboratory examination: Secondary | ICD-10-CM | POA: Insufficient documentation

## 2014-05-31 DIAGNOSIS — I252 Old myocardial infarction: Secondary | ICD-10-CM | POA: Insufficient documentation

## 2014-05-31 DIAGNOSIS — Z950 Presence of cardiac pacemaker: Secondary | ICD-10-CM | POA: Insufficient documentation

## 2014-05-31 DIAGNOSIS — E119 Type 2 diabetes mellitus without complications: Secondary | ICD-10-CM | POA: Diagnosis not present

## 2014-05-31 DIAGNOSIS — I2510 Atherosclerotic heart disease of native coronary artery without angina pectoris: Secondary | ICD-10-CM | POA: Insufficient documentation

## 2014-05-31 DIAGNOSIS — I10 Essential (primary) hypertension: Secondary | ICD-10-CM | POA: Diagnosis not present

## 2014-05-31 DIAGNOSIS — I4891 Unspecified atrial fibrillation: Secondary | ICD-10-CM | POA: Diagnosis not present

## 2014-05-31 LAB — SURGICAL PCR SCREEN
MRSA, PCR: NEGATIVE
Staphylococcus aureus: NEGATIVE

## 2014-05-31 LAB — BASIC METABOLIC PANEL
Anion gap: 9 (ref 5–15)
BUN: 31 mg/dL — ABNORMAL HIGH (ref 6–23)
CO2: 25 mmol/L (ref 19–32)
Calcium: 10.1 mg/dL (ref 8.4–10.5)
Chloride: 102 mmol/L (ref 96–112)
Creatinine, Ser: 1.22 mg/dL (ref 0.50–1.35)
GFR calc Af Amer: 69 mL/min — ABNORMAL LOW (ref >90)
GFR calc non Af Amer: 60 mL/min — ABNORMAL LOW (ref >90)
Glucose, Bld: 191 mg/dL — ABNORMAL HIGH (ref 70–99)
Potassium: 5 mmol/L (ref 3.5–5.1)
Sodium: 136 mmol/L (ref 135–145)

## 2014-05-31 LAB — GLUCOSE, CAPILLARY: Glucose-Capillary: 180 mg/dL — ABNORMAL HIGH (ref 70–99)

## 2014-05-31 LAB — PROTIME-INR
INR: 1.06 (ref 0.00–1.49)
Prothrombin Time: 13.9 seconds (ref 11.6–15.2)

## 2014-05-31 LAB — TYPE AND SCREEN
ABO/RH(D): A NEG
Antibody Screen: NEGATIVE

## 2014-05-31 LAB — CBC
HCT: 40.7 % (ref 39.0–52.0)
Hemoglobin: 14.2 g/dL (ref 13.0–17.0)
MCH: 32.5 pg (ref 26.0–34.0)
MCHC: 34.9 g/dL (ref 30.0–36.0)
MCV: 93.1 fL (ref 78.0–100.0)
Platelets: 224 10*3/uL (ref 150–400)
RBC: 4.37 MIL/uL (ref 4.22–5.81)
RDW: 12.4 % (ref 11.5–15.5)
WBC: 10.5 10*3/uL (ref 4.0–10.5)

## 2014-05-31 LAB — APTT: aPTT: 33 seconds (ref 24–37)

## 2014-05-31 NOTE — Progress Notes (Signed)
Luke [name] from [facility] made aware of patient's surgery date,  time and type of procedure being done . Stated he will be here at 1115 AM

## 2014-05-31 NOTE — Pre-Procedure Instructions (Signed)
Luke [name]  05/31/2014   Your procedure is scheduled on:  Thursday June 09, 2014 at 1140 AM  Report to [facility] at [facility] AM  AM.  Call this number if you have problems the morning of surgery: [phone]   Remember:   Do not eat food or drink liquids after midnight.   Take these medicines the morning of surgery with A SIP OF WATER: Norvasc, and Coreg,   Do not wear jewelry.  Do not wear lotions, powders, or perfumes. You may not wear deodorant.             Men may shave face and neck.  Do not bring valuables to the hospital.  [facility] is not responsible                  for any belongings or valuables.               Contacts, dentures or bridgework may not be worn into surgery.  Leave suitcase in the car. After surgery it may be brought to your room.  For patients admitted to the hospital, discharge time is determined by your                treatment team.               Patients discharged the day of surgery will not be allowed to drive  home.    Special Instructions: Luke [name] - Preparing for Surgery  Before surgery, you can play an important role.  Because skin is not sterile, your skin needs to be as free of germs as possible.  You can reduce the number of germs on you skin by washing with CHG (chlorahexidine gluconate) soap before surgery.  CHG is an antiseptic cleaner which kills germs and bonds with the skin to continue killing germs even after washing.  Please DO NOT use if you have an allergy to CHG or antibacterial soaps.  If your skin becomes reddened/irritated stop using the CHG and inform your nurse when you arrive at Short Stay.  Do not shave (including legs and underarms) for at least 48 hours prior to the first CHG shower.  You may shave your face.  Please follow these instructions carefully:   1.  Shower with CHG Soap the night before surgery and the                                morning of Surgery.  2.  If you choose to wash  your hair, wash your hair first as usual with your       normal shampoo.  3.  After you shampoo, rinse your hair and body thoroughly to remove the                      Shampoo.  4.  Use CHG as you would any other liquid soap.  You can apply chg directly       to the skin and wash gently with scrungie or a clean washcloth.  5.  Apply the CHG Soap to your body ONLY FROM THE NECK DOWN.        Do not use on open wounds or open sores.  Avoid contact with your eyes,       ears, mouth and genitals (private parts).  Wash genitals (private parts)       with  your normal soap.  6.  Wash thoroughly, paying special attention to the area where your surgery        will be performed.  7.  Thoroughly rinse your body with warm water from the neck down.  8.  DO NOT shower/wash with your normal soap after using and rinsing off       the CHG Soap.  9.  Pat yourself dry with a clean towel.            10.  Wear clean pajamas.            11.  Place clean sheets on your bed the night of your first shower and do not        sleep with pets.  Day of Surgery  Do not apply any lotions/deoderants the morning of surgery.  Please wear clean clothes to the hospital/surgery center.      Please read over the following fact sheets that you were given: Pain Booklet, Coughing and Deep Breathing, Blood Transfusion Information, MRSA Information and Surgical Site Infection Prevention

## 2014-05-31 NOTE — Progress Notes (Signed)
Anesthesia chart review: Patient is a 69 year old male posted for C2 laminectomy, C2-4 posterior cervical fusion on 06/09/14 by Dr. Ronnald Ramp.  Procedure was initially scheduled for 09/01/13, but was postponed by patient to allow time to get a second opinion.  History includes EVAR of AAA 12/03/11, PAD, former smoker, DM2, HTN, CAD/MI, ischemic cardiomyopathy, left BBB with intermittent CHB, s/p dual chamber ICD (MADIT-CRT) [employer] 07/14/12, afib, anxiety, low T, HLD, UHR 05/2013. PCP is Dr. Elsie Stain. His Cardiologist is Dr. [name] who cleared the patient for this procedure. EP Cardiologist is Dr. Caryl Comes who is aware of plans for surgery.  Echo on 07/15/12 showed: - Left ventricle: The cavity size was moderately dilated. Wall thickness was normal. Systolic function was moderately to severely reduced. The estimated ejection fraction was in the range of 30% to 35%. Severe diffuse hypokinesis. There was fusion of early and atrial contributions to ventricular filling. - Mitral valve: Trivial regurgitation. - Left atrium: The atrium was mildly dilated. - Pulmonary arteries: Systolic pressure was mildly increased. PA peak pressure: 71mm Hg (S).  He had an abnormal stress test on 10/09/11 that showed: There is evidence of a previous large anterior septal MI with an inferior septal MI. The LV is markedly dilated with global hypokinesis. There is severe hypokinesis in the anterior septal and inferior septal walls. LV Ejection Fraction: 30%. He subsequently had a cardiac cath on 10/15/11 that showed:  1. Moderate to severe 2 vessel obstructive CAD (prox LAD 70-80%, mid LAD 40%, LCX normal, mid RCA 50-60%, distal RCA 70%, bifurcation of PDA/PLOM 70%).  2. Severe LV dysfunction. EF 30-35%.  Ultimately a decision was made to treat him medically.   EKG on 04/15/14 showed v-paced rhythm.  CXR on 08/23/13 showed no acute cardiopulmonary disease.   Preoperative labs noted. His A1C on 03/22/14 was 7.4.  ICD perioperative  device form recommends intra-operative magnet.  However, since he is having a posterior cervical fusion I anticipate that the Blanco will be needed to deactivate his device pre-operatively. PAT RN Enid Derry has notified Aaron Edelman with [employer].   George Hugh [facility]/Anesthesiology Phone [phone] 05/31/2014 4:57 PM

## 2014-06-08 MED ORDER — CEFAZOLIN SODIUM-DEXTROSE 2-3 GM-% IV SOLR
2.0000 g | INTRAVENOUS | Status: AC
  Administered 2014-06-09: 2 g via INTRAVENOUS
  Filled 2014-06-08: qty 50

## 2014-06-08 MED ORDER — DEXAMETHASONE SODIUM PHOSPHATE 10 MG/ML IJ SOLN
10.0000 mg | INTRAMUSCULAR | Status: AC
  Administered 2014-06-09: 10 mg via INTRAVENOUS
  Filled 2014-06-08: qty 1

## 2014-06-09 ENCOUNTER — Inpatient Hospital Stay (HOSPITAL_COMMUNITY): Payer: Medicare Other | Admitting: Emergency Medicine

## 2014-06-09 ENCOUNTER — Inpatient Hospital Stay (HOSPITAL_COMMUNITY)
Admission: RE | Admit: 2014-06-09 | Discharge: 2014-06-10 | DRG: 472 | Disposition: A | Discharge status: Home / Self Care | Payer: Medicare Other | Source: Ambulatory Visit | Attending: Neurological Surgery | Admitting: Neurological Surgery

## 2014-06-09 ENCOUNTER — Inpatient Hospital Stay (HOSPITAL_COMMUNITY): Payer: Medicare Other | Admitting: Anesthesiology

## 2014-06-09 ENCOUNTER — Encounter (HOSPITAL_COMMUNITY): Payer: Self-pay | Admitting: *Deleted

## 2014-06-09 ENCOUNTER — Inpatient Hospital Stay (HOSPITAL_COMMUNITY): Payer: Medicare Other

## 2014-06-09 ENCOUNTER — Encounter (HOSPITAL_COMMUNITY)
Admission: RE | Disposition: A | Discharge status: Home / Self Care | Payer: Self-pay | Source: Ambulatory Visit | Attending: Neurological Surgery

## 2014-06-09 DIAGNOSIS — I429 Cardiomyopathy, unspecified: Secondary | ICD-10-CM | POA: Diagnosis present

## 2014-06-09 DIAGNOSIS — Z7982 Long term (current) use of aspirin: Secondary | ICD-10-CM

## 2014-06-09 DIAGNOSIS — Z79899 Other long term (current) drug therapy: Secondary | ICD-10-CM | POA: Diagnosis not present

## 2014-06-09 DIAGNOSIS — Z9889 Other specified postprocedural states: Secondary | ICD-10-CM | POA: Diagnosis not present

## 2014-06-09 DIAGNOSIS — I10 Essential (primary) hypertension: Secondary | ICD-10-CM | POA: Diagnosis present

## 2014-06-09 DIAGNOSIS — M488X2 Other specified spondylopathies, cervical region: Secondary | ICD-10-CM | POA: Diagnosis not present

## 2014-06-09 DIAGNOSIS — Z8249 Family history of ischemic heart disease and other diseases of the circulatory system: Secondary | ICD-10-CM | POA: Diagnosis not present

## 2014-06-09 DIAGNOSIS — I447 Left bundle-branch block, unspecified: Secondary | ICD-10-CM | POA: Diagnosis present

## 2014-06-09 DIAGNOSIS — I739 Peripheral vascular disease, unspecified: Secondary | ICD-10-CM | POA: Diagnosis present

## 2014-06-09 DIAGNOSIS — Z87891 Personal history of nicotine dependence: Secondary | ICD-10-CM

## 2014-06-09 DIAGNOSIS — S14129D Central cord syndrome at unspecified level of cervical spinal cord, subsequent encounter: Secondary | ICD-10-CM | POA: Diagnosis not present

## 2014-06-09 DIAGNOSIS — Z885 Allergy status to narcotic agent status: Secondary | ICD-10-CM | POA: Diagnosis not present

## 2014-06-09 DIAGNOSIS — M5021 Other cervical disc displacement,  high cervical region: Secondary | ICD-10-CM | POA: Diagnosis present

## 2014-06-09 DIAGNOSIS — E119 Type 2 diabetes mellitus without complications: Secondary | ICD-10-CM | POA: Diagnosis present

## 2014-06-09 DIAGNOSIS — F419 Anxiety disorder, unspecified: Secondary | ICD-10-CM | POA: Diagnosis present

## 2014-06-09 DIAGNOSIS — Z981 Arthrodesis status: Secondary | ICD-10-CM

## 2014-06-09 DIAGNOSIS — M4712 Other spondylosis with myelopathy, cervical region: Secondary | ICD-10-CM | POA: Diagnosis not present

## 2014-06-09 DIAGNOSIS — M4802 Spinal stenosis, cervical region: Secondary | ICD-10-CM | POA: Diagnosis present

## 2014-06-09 DIAGNOSIS — I714 Abdominal aortic aneurysm, without rupture: Secondary | ICD-10-CM | POA: Diagnosis present

## 2014-06-09 DIAGNOSIS — M5020 Other cervical disc displacement, unspecified cervical region: Secondary | ICD-10-CM | POA: Diagnosis not present

## 2014-06-09 DIAGNOSIS — I252 Old myocardial infarction: Secondary | ICD-10-CM | POA: Diagnosis not present

## 2014-06-09 DIAGNOSIS — M4322 Fusion of spine, cervical region: Secondary | ICD-10-CM

## 2014-06-09 DIAGNOSIS — Z95810 Presence of automatic (implantable) cardiac defibrillator: Secondary | ICD-10-CM

## 2014-06-09 DIAGNOSIS — E785 Hyperlipidemia, unspecified: Secondary | ICD-10-CM | POA: Diagnosis present

## 2014-06-09 DIAGNOSIS — I2510 Atherosclerotic heart disease of native coronary artery without angina pectoris: Secondary | ICD-10-CM | POA: Diagnosis present

## 2014-06-09 DIAGNOSIS — Z888 Allergy status to other drugs, medicaments and biological substances status: Secondary | ICD-10-CM

## 2014-06-09 DIAGNOSIS — I4891 Unspecified atrial fibrillation: Secondary | ICD-10-CM | POA: Diagnosis present

## 2014-06-09 HISTORY — PX: POSTERIOR CERVICAL FUSION/FORAMINOTOMY: SHX5038

## 2014-06-09 LAB — GLUCOSE, CAPILLARY
Glucose-Capillary: 234 mg/dL — ABNORMAL HIGH (ref 70–99)
Glucose-Capillary: 240 mg/dL — ABNORMAL HIGH (ref 70–99)
Glucose-Capillary: 242 mg/dL — ABNORMAL HIGH (ref 70–99)
Glucose-Capillary: 245 mg/dL — ABNORMAL HIGH (ref 70–99)
Glucose-Capillary: 300 mg/dL — ABNORMAL HIGH (ref 70–99)

## 2014-06-09 SURGERY — POSTERIOR CERVICAL FUSION/FORAMINOTOMY LEVEL 2
Anesthesia: General | Site: Spine Cervical

## 2014-06-09 MED ORDER — AMLODIPINE BESYLATE 2.5 MG PO TABS
2.5000 mg | ORAL_TABLET | Freq: Every day | ORAL | Status: DC
  Administered 2014-06-10: 2.5 mg via ORAL
  Filled 2014-06-09: qty 1

## 2014-06-09 MED ORDER — ONDANSETRON HCL 4 MG/2ML IJ SOLN
INTRAMUSCULAR | Status: DC | PRN
  Administered 2014-06-09: 4 mg via INTRAVENOUS

## 2014-06-09 MED ORDER — FENTANYL CITRATE 0.05 MG/ML IJ SOLN
INTRAMUSCULAR | Status: DC | PRN
  Administered 2014-06-09: 50 ug via INTRAVENOUS
  Administered 2014-06-09: 100 ug via INTRAVENOUS
  Administered 2014-06-09 (×3): 50 ug via INTRAVENOUS

## 2014-06-09 MED ORDER — EPHEDRINE SULFATE 50 MG/ML IJ SOLN
INTRAMUSCULAR | Status: DC | PRN
  Administered 2014-06-09: 10 mg via INTRAVENOUS

## 2014-06-09 MED ORDER — DEXTROSE 5 % IV SOLN
500.0000 mg | Freq: Four times a day (QID) | INTRAVENOUS | Status: DC | PRN
  Filled 2014-06-09: qty 5

## 2014-06-09 MED ORDER — PROMETHAZINE HCL 25 MG/ML IJ SOLN: 6.2500 mg | INTRAMUSCULAR | Status: DC | PRN

## 2014-06-09 MED ORDER — ACETAMINOPHEN 650 MG RE SUPP: 650.0000 mg | RECTAL | Status: DC | PRN

## 2014-06-09 MED ORDER — ASPIRIN EC 81 MG PO TBEC
81.0000 mg | DELAYED_RELEASE_TABLET | Freq: Every day | ORAL | Status: DC
  Administered 2014-06-10: 81 mg via ORAL
  Filled 2014-06-09: qty 1

## 2014-06-09 MED ORDER — MIDAZOLAM HCL 2 MG/2ML IJ SOLN
INTRAMUSCULAR | Status: AC
  Filled 2014-06-09: qty 2

## 2014-06-09 MED ORDER — SODIUM CHLORIDE 0.9 % IV SOLN: 250.0000 mL | INTRAVENOUS | Status: DC

## 2014-06-09 MED ORDER — INSULIN ASPART 100 UNIT/ML ~~LOC~~ SOLN: 0.0000 [IU] | Freq: Three times a day (TID) | SUBCUTANEOUS | Status: DC

## 2014-06-09 MED ORDER — 0.9 % SODIUM CHLORIDE (POUR BTL) OPTIME
TOPICAL | Status: DC | PRN
  Administered 2014-06-09: 1000 mL

## 2014-06-09 MED ORDER — LACTATED RINGERS IV SOLN
INTRAVENOUS | Status: DC
  Administered 2014-06-09: 11:00:00 via INTRAVENOUS

## 2014-06-09 MED ORDER — MIDAZOLAM HCL 5 MG/5ML IJ SOLN
INTRAMUSCULAR | Status: DC | PRN
  Administered 2014-06-09: 2 mg via INTRAVENOUS

## 2014-06-09 MED ORDER — METHOCARBAMOL 500 MG PO TABS
500.0000 mg | ORAL_TABLET | Freq: Four times a day (QID) | ORAL | Status: DC | PRN
  Administered 2014-06-09: 500 mg via ORAL
  Filled 2014-06-09: qty 1

## 2014-06-09 MED ORDER — VECURONIUM BROMIDE 10 MG IV SOLR
INTRAVENOUS | Status: DC | PRN
  Administered 2014-06-09 (×2): 2 mg via INTRAVENOUS

## 2014-06-09 MED ORDER — ONDANSETRON HCL 4 MG/2ML IJ SOLN: 4.0000 mg | INTRAMUSCULAR | Status: DC | PRN

## 2014-06-09 MED ORDER — FENTANYL CITRATE 0.05 MG/ML IJ SOLN
INTRAMUSCULAR | Status: AC
  Filled 2014-06-09: qty 5

## 2014-06-09 MED ORDER — ONE-DAILY MULTI VITAMINS PO TABS: 1.0000 | ORAL_TABLET | Freq: Every day | ORAL | Status: DC

## 2014-06-09 MED ORDER — LIDOCAINE HCL (CARDIAC) 20 MG/ML IV SOLN
INTRAVENOUS | Status: DC | PRN
  Administered 2014-06-09: 80 mg via INTRAVENOUS

## 2014-06-09 MED ORDER — VECURONIUM BROMIDE 10 MG IV SOLR
INTRAVENOUS | Status: AC
  Filled 2014-06-09: qty 10

## 2014-06-09 MED ORDER — SODIUM CHLORIDE 0.9 % IJ SOLN
3.0000 mL | Freq: Two times a day (BID) | INTRAMUSCULAR | Status: DC
  Administered 2014-06-09: 3 mL via INTRAVENOUS

## 2014-06-09 MED ORDER — SODIUM CHLORIDE 0.9 % IR SOLN
Status: DC | PRN
  Administered 2014-06-09: 500 mL

## 2014-06-09 MED ORDER — MENTHOL 3 MG MT LOZG
1.0000 | LOZENGE | OROMUCOSAL | Status: DC | PRN
  Filled 2014-06-09 (×2): qty 9

## 2014-06-09 MED ORDER — PHENOL 1.4 % MT LIQD: 1.0000 | OROMUCOSAL | Status: DC | PRN

## 2014-06-09 MED ORDER — PROPOFOL 10 MG/ML IV BOLUS
INTRAVENOUS | Status: AC
  Filled 2014-06-09: qty 20

## 2014-06-09 MED ORDER — SODIUM CHLORIDE 0.9 % IJ SOLN: 3.0000 mL | INTRAMUSCULAR | Status: DC | PRN

## 2014-06-09 MED ORDER — STERILE WATER FOR INJECTION IJ SOLN
INTRAMUSCULAR | Status: AC
  Filled 2014-06-09: qty 10

## 2014-06-09 MED ORDER — THROMBIN 5000 UNITS EX SOLR
CUTANEOUS | Status: DC | PRN
  Administered 2014-06-09 (×2): 5000 [IU] via TOPICAL

## 2014-06-09 MED ORDER — ADULT MULTIVITAMIN W/MINERALS CH
1.0000 | ORAL_TABLET | Freq: Every day | ORAL | Status: DC
  Administered 2014-06-09 – 2014-06-10 (×2): 1 via ORAL
  Filled 2014-06-09 (×2): qty 1

## 2014-06-09 MED ORDER — GLYCOPYRROLATE 0.2 MG/ML IJ SOLN
INTRAMUSCULAR | Status: DC | PRN
  Administered 2014-06-09: .7 mg via INTRAVENOUS

## 2014-06-09 MED ORDER — METFORMIN HCL 500 MG PO TABS
500.0000 mg | ORAL_TABLET | Freq: Two times a day (BID) | ORAL | Status: DC
  Administered 2014-06-09 – 2014-06-10 (×2): 500 mg via ORAL
  Filled 2014-06-09 (×4): qty 1

## 2014-06-09 MED ORDER — BUPIVACAINE HCL (PF) 0.25 % IJ SOLN
INTRAMUSCULAR | Status: DC | PRN
  Administered 2014-06-09: 7 mL

## 2014-06-09 MED ORDER — PROPOFOL 10 MG/ML IV BOLUS
INTRAVENOUS | Status: DC | PRN
  Administered 2014-06-09: 160 mg via INTRAVENOUS
  Administered 2014-06-09: 50 mg via INTRAVENOUS
  Administered 2014-06-09: 40 mg via INTRAVENOUS

## 2014-06-09 MED ORDER — CARVEDILOL 25 MG PO TABS
25.0000 mg | ORAL_TABLET | Freq: Two times a day (BID) | ORAL | Status: DC
  Administered 2014-06-09 – 2014-06-10 (×2): 25 mg via ORAL
  Filled 2014-06-09 (×4): qty 1

## 2014-06-09 MED ORDER — FENTANYL CITRATE 0.05 MG/ML IJ SOLN
25.0000 ug | INTRAMUSCULAR | Status: DC | PRN
  Administered 2014-06-09 (×2): 25 ug via INTRAVENOUS

## 2014-06-09 MED ORDER — OXYCODONE-ACETAMINOPHEN 5-325 MG PO TABS
1.0000 | ORAL_TABLET | ORAL | Status: DC | PRN
  Administered 2014-06-09 – 2014-06-10 (×4): 2 via ORAL
  Filled 2014-06-09 (×4): qty 2

## 2014-06-09 MED ORDER — HEMOSTATIC AGENTS (NO CHARGE) OPTIME
TOPICAL | Status: DC | PRN
  Administered 2014-06-09: 1 via TOPICAL

## 2014-06-09 MED ORDER — LACTATED RINGERS IV SOLN
INTRAVENOUS | Status: DC | PRN
  Administered 2014-06-09 (×2): via INTRAVENOUS

## 2014-06-09 MED ORDER — POTASSIUM CHLORIDE IN NACL 20-0.9 MEQ/L-% IV SOLN
INTRAVENOUS | Status: DC
  Filled 2014-06-09 (×3): qty 1000

## 2014-06-09 MED ORDER — PHENYLEPHRINE HCL 10 MG/ML IJ SOLN
10.0000 mg | INTRAVENOUS | Status: DC | PRN
  Administered 2014-06-09: 100 ug/min via INTRAVENOUS

## 2014-06-09 MED ORDER — LISINOPRIL 20 MG PO TABS
20.0000 mg | ORAL_TABLET | Freq: Two times a day (BID) | ORAL | Status: DC
  Administered 2014-06-09 – 2014-06-10 (×2): 20 mg via ORAL
  Filled 2014-06-09 (×3): qty 1

## 2014-06-09 MED ORDER — THROMBIN 5000 UNITS EX SOLR
OROMUCOSAL | Status: DC | PRN
  Administered 2014-06-09: 14:00:00 via TOPICAL

## 2014-06-09 MED ORDER — GLIPIZIDE 5 MG PO TABS
5.0000 mg | ORAL_TABLET | Freq: Two times a day (BID) | ORAL | Status: DC
  Administered 2014-06-09 – 2014-06-10 (×2): 5 mg via ORAL
  Filled 2014-06-09 (×4): qty 1

## 2014-06-09 MED ORDER — NEOSTIGMINE METHYLSULFATE 10 MG/10ML IV SOLN
INTRAVENOUS | Status: DC | PRN
  Administered 2014-06-09: 4 mg via INTRAVENOUS

## 2014-06-09 MED ORDER — ARTIFICIAL TEARS OP OINT
TOPICAL_OINTMENT | OPHTHALMIC | Status: DC | PRN
  Administered 2014-06-09: 1 via OPHTHALMIC

## 2014-06-09 MED ORDER — ACETAMINOPHEN 325 MG PO TABS: 650.0000 mg | ORAL_TABLET | ORAL | Status: DC | PRN

## 2014-06-09 MED ORDER — CEFAZOLIN SODIUM 1-5 GM-% IV SOLN
1.0000 g | Freq: Three times a day (TID) | INTRAVENOUS | Status: AC
Start: 2014-06-09 — End: 2014-06-10
  Administered 2014-06-09 – 2014-06-10 (×2): 1 g via INTRAVENOUS
  Filled 2014-06-09 (×2): qty 50

## 2014-06-09 MED ORDER — FENTANYL CITRATE 0.05 MG/ML IJ SOLN
INTRAMUSCULAR | Status: AC
  Filled 2014-06-09: qty 2

## 2014-06-09 MED ORDER — MORPHINE SULFATE 2 MG/ML IJ SOLN: 1.0000 mg | INTRAMUSCULAR | Status: DC | PRN

## 2014-06-09 MED ORDER — INSULIN ASPART 100 UNIT/ML ~~LOC~~ SOLN
0.0000 [IU] | Freq: Every day | SUBCUTANEOUS | Status: DC
  Administered 2014-06-09: 3 [IU] via SUBCUTANEOUS

## 2014-06-09 MED ORDER — ROCURONIUM BROMIDE 100 MG/10ML IV SOLN
INTRAVENOUS | Status: DC | PRN
  Administered 2014-06-09: 50 mg via INTRAVENOUS

## 2014-06-09 SURGICAL SUPPLY — 66 items
APL SKNCLS STERI-STRIP NONHPOA (GAUZE/BANDAGES/DRESSINGS) ×1
BAG DECANTER FOR FLEXI CONT (MISCELLANEOUS) ×3 IMPLANT
BENZOIN TINCTURE PRP APPL 2/3 (GAUZE/BANDAGES/DRESSINGS) ×3 IMPLANT
BLADE CLIPPER SURG (BLADE) IMPLANT
BUR MATCHSTICK NEURO 3.0 LAGG (BURR) ×3 IMPLANT
CANISTER SUCT 3000ML (MISCELLANEOUS) ×3 IMPLANT
CLOSURE WOUND 1/2 X4 (GAUZE/BANDAGES/DRESSINGS) ×1
CONT SPEC 4OZ CLIKSEAL STRL BL (MISCELLANEOUS) ×3 IMPLANT
DRAPE C-ARM 42X72 X-RAY (DRAPES) ×6 IMPLANT
DRAPE LAPAROTOMY 100X72 PEDS (DRAPES) ×3 IMPLANT
DRAPE POUCH INSTRU U-SHP 10X18 (DRAPES) ×3 IMPLANT
DRSG OPSITE 4X5.5 SM (GAUZE/BANDAGES/DRESSINGS) ×3 IMPLANT
DRSG OPSITE POSTOP 4X6 (GAUZE/BANDAGES/DRESSINGS) ×2 IMPLANT
DRSG TELFA 3X8 NADH (GAUZE/BANDAGES/DRESSINGS) IMPLANT
DURAPREP 6ML APPLICATOR 50/CS (WOUND CARE) ×3 IMPLANT
ELECT REM PT RETURN 9FT ADLT (ELECTROSURGICAL) ×3
ELECTRODE REM PT RTRN 9FT ADLT (ELECTROSURGICAL) ×1 IMPLANT
EVACUATOR 1/8 PVC DRAIN (DRAIN) IMPLANT
GAUZE SPONGE 4X4 12PLY STRL (GAUZE/BANDAGES/DRESSINGS) ×1 IMPLANT
GAUZE SPONGE 4X4 16PLY XRAY LF (GAUZE/BANDAGES/DRESSINGS) IMPLANT
GLOVE BIO SURGEON STRL SZ8 (GLOVE) ×3 IMPLANT
GLOVE BIOGEL PI IND STRL 7.5 (GLOVE) IMPLANT
GLOVE BIOGEL PI INDICATOR 7.5 (GLOVE) ×4
GLOVE EXAM NITRILE LRG STRL (GLOVE) IMPLANT
GLOVE EXAM NITRILE MD LF STRL (GLOVE) IMPLANT
GLOVE EXAM NITRILE XL STR (GLOVE) IMPLANT
GLOVE EXAM NITRILE XS STR PU (GLOVE) IMPLANT
GLOVE SURG SS PI 7.0 STRL IVOR (GLOVE) ×6 IMPLANT
GOWN STRL REUS W/ TWL LRG LVL3 (GOWN DISPOSABLE) IMPLANT
GOWN STRL REUS W/ TWL XL LVL3 (GOWN DISPOSABLE) ×1 IMPLANT
GOWN STRL REUS W/TWL 2XL LVL3 (GOWN DISPOSABLE) IMPLANT
GOWN STRL REUS W/TWL LRG LVL3 (GOWN DISPOSABLE)
GOWN STRL REUS W/TWL XL LVL3 (GOWN DISPOSABLE) ×9
HEMOSTAT POWDER KIT SURGIFOAM (HEMOSTASIS) ×2 IMPLANT
KIT BASIN OR (CUSTOM PROCEDURE TRAY) ×3 IMPLANT
KIT ROOM TURNOVER OR (KITS) ×3 IMPLANT
MARKER SKIN DUAL TIP RULER LAB (MISCELLANEOUS) ×3 IMPLANT
NDL HYPO 18GX1.5 BLUNT FILL (NEEDLE) IMPLANT
NDL HYPO 25X1 1.5 SAFETY (NEEDLE) ×1 IMPLANT
NDL SPNL 20GX3.5 QUINCKE YW (NEEDLE) ×1 IMPLANT
NEEDLE HYPO 18GX1.5 BLUNT FILL (NEEDLE) IMPLANT
NEEDLE HYPO 25X1 1.5 SAFETY (NEEDLE) ×3 IMPLANT
NEEDLE SPNL 20GX3.5 QUINCKE YW (NEEDLE) IMPLANT
NS IRRIG 1000ML POUR BTL (IV SOLUTION) ×3 IMPLANT
PACK LAMINECTOMY NEURO (CUSTOM PROCEDURE TRAY) ×3 IMPLANT
PAD DRESSING TELFA 3X8 NADH (GAUZE/BANDAGES/DRESSINGS) ×1 IMPLANT
PIN MAYFIELD SKULL DISP (PIN) ×3 IMPLANT
PUTTY STIMUBLAST 2.5CC (Putty) ×2 IMPLANT
ROD VUEPOINT 80MM (Rod) ×4 IMPLANT
SCREW MA MM 3.5X12 (Screw) ×8 IMPLANT
SCREW SET THREADED (Screw) ×12 IMPLANT
SCREW VUEPOINT 3.5X16MM (Screw) ×4 IMPLANT
SPONGE SURGIFOAM ABS GEL SZ50 (HEMOSTASIS) ×3 IMPLANT
STRIP CLOSURE SKIN 1/2X4 (GAUZE/BANDAGES/DRESSINGS) ×2 IMPLANT
SUT VIC AB 0 CT1 18XCR BRD8 (SUTURE) ×1 IMPLANT
SUT VIC AB 0 CT1 8-18 (SUTURE) ×3
SUT VIC AB 2-0 CP2 18 (SUTURE) ×3 IMPLANT
SUT VIC AB 3-0 SH 8-18 (SUTURE) ×3 IMPLANT
SYR 20ML ECCENTRIC (SYRINGE) ×3 IMPLANT
SYR 3ML LL SCALE MARK (SYRINGE) IMPLANT
TAPE STRIPS DRAPE STRL (GAUZE/BANDAGES/DRESSINGS) ×2 IMPLANT
TOWEL OR 17X24 6PK STRL BLUE (TOWEL DISPOSABLE) ×3 IMPLANT
TOWEL OR 17X26 10 PK STRL BLUE (TOWEL DISPOSABLE) ×3 IMPLANT
TRAY FOLEY CATH 14FRSI W/METER (CATHETERS) IMPLANT
UNDERPAD 30X30 INCONTINENT (UNDERPADS AND DIAPERS) ×1 IMPLANT
WATER STERILE IRR 1000ML POUR (IV SOLUTION) ×3 IMPLANT

## 2014-06-09 NOTE — Op Note (Signed)
06/09/2014  3:28 PM  PATIENT:  Luke [name]  69 y.o. male  PRE-OPERATIVE DIAGNOSIS:  Cervical spinal stenosis secondary to OPLL C2-C4 with history of central cord syndrome  POST-OPERATIVE DIAGNOSIS:  Same  PROCEDURE:  1. Decompressive cervical laminectomy and medial facetectomy C2-C4, 2. Posterior lateral arthrodesis C2-C4 utilizing locally harvested morselized autologous bone graft and morcellized allograft, 3. Segmental fixation C2-C4 utilizing lateral mass screws at C3 and C4 and pars screws at C2  SURGEON:  Sherley Bounds, MD  ASSISTANTS: None  ANESTHESIA:   General  EBL: 200 ml  Total I/O In: 1700 [I.V.:1700] Out: 200 [Blood:200]  BLOOD ADMINISTERED:none  DRAINS: Medium Hemovac   SPECIMEN:  No Specimen  INDICATION FOR PROCEDURE: This patient had a previous injury and suffered a central cord syndrome. He had a CT scan which showed OPLL from C2-C4 with spinal stenosis. Posterior cervical decompression and instrumented fusion from C2-C4. Patient understood the risks, benefits, and alternatives and potential outcomes and wished to proceed.  PROCEDURE DETAILS: The patient was brought to the operating room. Generalized endotracheal anesthesia was induced. The patient was affixed a 3 point Mayfield headrest and rolled into the prone position on chest rolls. All pressure points were padded. The posterior cervical region was cleaned and prepped with DuraPrep and then draped in the usual sterile fashion. 7 cc of local anesthesia was injected and a dorsal midline incision made in the posterior cervical region and carried down to the cervical fascia. The fascia was opened and the paraspinous musculature was taken down to expose C2-C4. Intraoperative fluoroscopy confirmed my level and then the dissection was carried out over the lateral facets. I localized the midpoint of each lateral mass and marked a region 1 mm medial to the midpoint of the lateral mass, and then drilled in an upward and  outward direction into the safe zone of each lateral mass at C3 and C4. I drilled to a depth of 12 mm and then checked my drill hole with a ball probe. I then placed a 12 mm lateral mass screws into the safe zone of each lateral mass until they were 2 fingers tight. I then gently decompressed the central canal with the 1 and 2 mm Kerrison punch from C2-C4. I thinned out the lamina with a high-speed drill and then completed the laminectomy with the Kerrison punches. Medial facetectomies were performed. Once the decompression was complete the dura was full and capacious and I could see the spinal cord pulsatile through the dura. I then found the entry point for pars screws at C2. Drilled with a hand drill to a depth of 14 mm, and then placed 16 mm screws into the pars of C2. I then decorticated the lateral masses and the facet joints and packed them with local autograft and morcellized allograft to perform arthrodesis from C2-C4. I then placed rods into the multiaxial screw heads of the screws and locked these into position with the locking caps and anti-torque device. I then checked the final construct with AP/Lat fluoroscopy. I irrigated with saline solution containing bacitracin. I placed a medium Hemovac drain through separate stab incision, and lined the dura with Gelfoam. After hemostasis was achieved I closed the muscle and the fascia with 0 Vicryl, subcutaneous tissue with 2-0 Vicryl, and the subcuticular tissue with 3-0 Vicryl. The skin was closed with benzoin and Steri-Strips. A sterile dressing was applied, the patient was turned to the supine position and taken out of the headrest, awakened from general anesthesia and transferred  to the recovery room in stable condition. At the end of the procedure all sponge, needle and instrument counts were correct.   PLAN OF CARE: Admit to inpatient   PATIENT DISPOSITION:  PACU - hemodynamically stable.   Delay start of Pharmacological VTE agent (>24hrs) due to  surgical blood loss or risk of bleeding:  yes

## 2014-06-09 NOTE — Progress Notes (Signed)
Pt transported to [facility] at this time. Darlina Guys, [place] rep paged, called back and informed.

## 2014-06-09 NOTE — Progress Notes (Signed)
Paged Darlina Guys, [place] rep whom called back. This nurse informed Lovey Newcomer of pt positioning (prone) during surgery and that ICD will need to be deactivated prior to surgery. Sandy requested call back at [phone] when pt transferred to [facility].

## 2014-06-09 NOTE — Anesthesia Procedure Notes (Signed)
Procedure Name: Intubation Date/Time: 06/09/2014 12:56 PM Performed by: Carola Frost Pre-anesthesia Checklist: Patient identified, Timeout performed, Emergency Drugs available, Suction available and Patient being monitored Patient Re-evaluated:Patient Re-evaluated prior to inductionOxygen Delivery Method: Circle system utilized Preoxygenation: Pre-oxygenation with 100% oxygen Intubation Type: IV induction and Cricoid Pressure applied Ventilation: Mask ventilation without difficulty and Oral airway inserted - appropriate to patient size Laryngoscope Size: Mac and 4 Grade View: Grade I Tube type: Oral Tube size: 7.5 mm Number of attempts: 1 Airway Equipment and Method: Stylet Placement Confirmation: CO2 detector,  positive ETCO2,  breath sounds checked- equal and bilateral and ETT inserted through vocal cords under direct vision Secured at: 23 cm Tube secured with: Tape Dental Injury: Teeth and Oropharynx as per pre-operative assessment

## 2014-06-09 NOTE — Anesthesia Preprocedure Evaluation (Addendum)
Anesthesia Evaluation  Patient identified by MRN, date of birth, ID band Patient awake    Reviewed: Allergy & Precautions, NPO status , Patient's Chart, lab work & pertinent test results  Airway Mallampati: II  TM Distance: >3 FB Neck ROM: Full    Dental  (+) Dental Advisory Given   Pulmonary former smoker,  breath sounds clear to auscultation        Cardiovascular hypertension, Pt. on medications + CAD, + Past MI and + Peripheral Vascular Disease + dysrhythmias + pacemaker + Cardiac Defibrillator Rhythm:Regular Rate:Normal     Neuro/Psych Anxiety    GI/Hepatic negative GI ROS, Neg liver ROS,   Endo/Other  diabetes, Type 2  Renal/GU negative Renal ROS     Musculoskeletal   Abdominal   Peds  Hematology   Anesthesia Other Findings   Reproductive/Obstetrics                            Anesthesia Physical Anesthesia Plan  ASA: III  Anesthesia Plan: General   Post-op Pain Management:    Induction: Intravenous  Airway Management Planned: Oral ETT  Additional Equipment:   Intra-op Plan:   Post-operative Plan: Extubation in OR  Informed Consent: I have reviewed the patients History and Physical, chart, labs and discussed the procedure including the risks, benefits and alternatives for the proposed anesthesia with the patient or authorized representative who has indicated his/her understanding and acceptance.   Dental advisory given  Plan Discussed with: Anesthesiologist, Surgeon and CRNA  Anesthesia Plan Comments:        Anesthesia Quick Evaluation

## 2014-06-09 NOTE — Progress Notes (Signed)
Utilization review completed.  

## 2014-06-09 NOTE — Anesthesia Postprocedure Evaluation (Signed)
  Anesthesia Post-op Note  Patient: Luke [name]  Procedure(s) Performed: Procedure(s) with comments: Laminectomy - Cervical two-Cervcial four posterior cervical instrumented fusion Cervical two-cervical four (N/A) - posterior   Patient Location: PACU  Anesthesia Type:General  Level of Consciousness: awake  Airway and Oxygen Therapy: Patient Spontanous Breathing  Post-op Pain: mild  Post-op Assessment: Post-op Vital signs reviewed  Post-op Vital Signs: Reviewed  Last Vitals:  Filed Vitals:   06/09/14 1700  BP: 181/81  Pulse: 70  Temp: 36.6 C  Resp: 16    Complications: No apparent anesthesia complications

## 2014-06-09 NOTE — H&P (Signed)
Subjective:   Patient is a 69 y.o. male admitted for posterior cervical decompression and fusion C2-C4 for OPLL and history of central cord syndrome. The patient first presented to me with complaints of neck pain, numbness of the arm(s) and loss of strength of the arm(s) after a trauma. Onset of symptoms was several months ago. The pain is described as aching and occurs intermittently. The pain is rated mild, and is located  in the neck. The symptoms have not been progressive. Symptoms are exacerbated by extending head backwards, and are relieved by none.  Previous work up includes CT of cervical spine, results: OPLL with cervical spinal stenosis.  Past Medical History  Diagnosis Date  . Diabetes mellitus     type II  . PAD (peripheral artery disease)   . AAA (abdominal aortic aneurysm) 2011    Per vascular surgery  . HLD (hyperlipidemia)   . Hypertension     white coat HTN-- often elevated in office and controlled on outside checks.  . Low testosterone     Hx of  . Cardiomyopathy     Nuclear, October 09, 2011, EF 30%, multiple focal wall motion abnormalities  . Ejection fraction < 50%     EF 30%, nuclear, October 09, 2011  . LBBB (left bundle branch block)     LBBB on EKG October 11, 2011,  no prior EKG has been done  . CAD (coronary artery disease)     Presumed CAD with nuclear scan October 09, 2011,  large anteroseptal MI and inferior MI. Catheterization scheduled October 15, 2011  . Anxiety     no med. in use for anxiety but pt. speaks openly of his stress & anxious feelings regarding impending surgery    . ICD (implantable cardioverter-defibrillator) in place     CRT-D placed March, 2014 complete heart block and k dysfunction  . Ejection fraction < 50%     EF previously 30%  //   EF 30-35%, echo, July 14, 2012, severe diffuse hypokinesis, PA pressure 43 mm mercury  . Pacemaker   . Myocardial infarction   . Drug therapy     Hyperkalemia with spironolactone  . Automatic implantable  cardioverter-defibrillator in situ   . Fall due to ice or snow Feb.  19, 2015  . Atrial fibrillation     Past Surgical History  Procedure Laterality Date  . Tonsillectomy      as a child   . Colonoscopy    . Cardiac catheterization    . Abdominal aortic aneurysm repair      EVAR   . Pacemaker insertion  07-16-12    pacemaker/defibrilator  . Umbilical hernia repair N/A 05/14/2013    Procedure: LAPAROSCOPIC exploration and repair of hernia in abdominal ;  Surgeon: Adin Hector, MD;  Location: [place];  Service: General;  Laterality: N/A;  . Insertion of mesh N/A 05/14/2013    Procedure: INSERTION OF MESH;  Surgeon: Adin Hector, MD;  Location: [place];  Service: General;  Laterality: N/A;  . Hernia repair  Jan. 9, 2015  . Abdominal aortagram N/A 10/28/2011    Procedure: ABDOMINAL [name];  Surgeon: Angelia Mould, MD;  Location: [facility] CATH LAB;  Service: Cardiovascular;  Laterality: N/A;  . Lower extremity angiogram Bilateral 10/28/2011    Procedure: LOWER EXTREMITY ANGIOGRAM;  Surgeon: Angelia Mould, MD;  Location: [facility] CATH LAB;  Service: Cardiovascular;  Laterality: Bilateral;  . Bi-ventricular implantable cardioverter defibrillator N/A 07/16/2012    Procedure: BI-VENTRICULAR IMPLANTABLE  CARDIOVERTER DEFIBRILLATOR  (CRT-D);  Surgeon: Deboraha Sprang, MD;  Location: [facility] CATH LAB;  Service: Cardiovascular;  Laterality: N/A;    Allergies  Allergen Reactions  . Aldactone [Spironolactone] Other (See Comments)    Hyperkalemia  . Codeine Rash    History  Substance Use Topics  . Smoking status: Former Smoker -- 2.00 packs/day for 40 years    Types: Cigarettes    Quit date: 05/06/2004  . Smokeless tobacco: Never Used     Comment: quit about 7 years ago  . Alcohol Use: 1.2 oz/week    2 Cans of beer per week     Comment: beer or wine  occassionally    Family History  Problem Relation Age of Onset  . Hypertension Mother   . Stroke Mother   . Hyperlipidemia Mother   . Diabetes  Sister   . Heart disease Sister     Before age 105  . Hypertension Sister   . Hyperlipidemia Sister   . Heart attack Sister   . Colon cancer Neg Hx   . Prostate cancer Neg Hx   . Cancer Father     Lung  . Hypertension Son    Prior to Admission medications   Medication Sig Start Date End Date Taking? Authorizing Provider  amLODipine (NORVASC) 2.5 MG tablet Take 1 tablet (2.5 mg total) by mouth daily. 03/08/14  Yes Carlena Bjornstad, MD  aspirin EC 81 MG tablet Take 81 mg by mouth daily.   Yes Historical Provider, MD  carvedilol (COREG) 25 MG tablet Take 1 tablet (25 mg total) by mouth 2 (two) times daily with a meal. 03/08/14 03/08/15 Yes Deboraha Sprang, MD  glipiZIDE (GLUCOTROL) 5 MG tablet Take 5 mg by mouth 2 (two) times daily before a meal.   Yes Historical Provider, MD  lisinopril (PRINIVIL,ZESTRIL) 20 MG tablet Take 1 tablet (20 mg total) by mouth 2 (two) times daily. 05/19/14  Yes Carlena Bjornstad, MD  metFORMIN (GLUCOPHAGE) 500 MG tablet take 2 tablets by mouth twice a day with food Patient taking differently: take 2 tablet by mouth twice a day with food 02/04/14  Yes Tonia Ghent, MD  Multiple Vitamin (MULTIVITAMIN) tablet Take 1 tablet by mouth daily.    Yes Historical Provider, MD  sildenafil (REVATIO) 20 MG tablet TAKE THREE TO FOUR TABLETS BY MOUTH DAILY AS NEEDED Patient taking differently: Take 60-80 mg by mouth daily as needed. TAKE THREE TO FOUR TABLETS BY MOUTH DAILY AS NEEDED, for neuropathy in feet.. 03/25/14  Yes Tonia Ghent, MD  simvastatin (ZOCOR) 40 MG tablet take 1 tablet by mouth at bedtime 05/19/14  Yes Tonia Ghent, MD  glucose blood (ACCU-CHEK AVIVA PLUS) test strip Use to check blood sugar three times a day, 2 hours after meals. DX: E11.51 02/16/14   Tonia Ghent, MD     Review of Systems  Positive ROS: neg  All other systems have been reviewed and were otherwise negative with the exception of those mentioned in the HPI and as above.  Objective: Vital  signs in last 24 hours: Temp:  [97.7 F (36.5 C)] 97.7 F (36.5 C) (02/04 1007) Pulse Rate:  [69] 69 (02/04 1007) Resp:  [20] 20 (02/04 1007) BP: (168)/(64) 168/64 mmHg (02/04 1030) SpO2:  [98 %] 98 % (02/04 1007) Weight:  [195 lb (88.451 kg)] 195 lb (88.451 kg) (02/04 1007)  General Appearance: Alert, cooperative, no distress, appears stated age Head: Normocephalic, without obvious abnormality, atraumatic Eyes:  PERRL, conjunctiva/corneas clear, EOM's intact      Neck: Supple, symmetrical, trachea midline, Back: Symmetric, no curvature, ROM normal, no CVA tenderness Lungs:  respirations unlabored Heart: Regular rate and rhythm Abdomen: Soft, non-tender Extremities: Extremities normal, atraumatic, no cyanosis or edema Pulses: 2+ and symmetric all extremities Skin: Skin color, texture, turgor normal, no rashes or lesions  NEUROLOGIC:  Mental status: Alert and oriented x4, no aphasia, good attention span, fund of knowledge and memory  Motor Exam - grossly normal Sensory Exam - grossly normal Reflexes: 1+ Coordination - grossly normal Gait - grossly normal Balance - grossly normal Cranial Nerves: I: smell Not tested  II: visual acuity  OS: nl    OD: nl  II: visual fields Full to confrontation  II: pupils Equal, round, reactive to light  III,VII: ptosis None  III,IV,VI: extraocular muscles  Full ROM  V: mastication Normal  V: facial light touch sensation  Normal  V,VII: corneal reflex  Present  VII: facial muscle function - upper  Normal  VII: facial muscle function - lower Normal  VIII: hearing Not tested  IX: soft palate elevation  Normal  IX,X: gag reflex Present  XI: trapezius strength  5/5  XI: sternocleidomastoid strength 5/5  XI: neck flexion strength  5/5  XII: tongue strength  Normal    Data Review Lab Results  Component Value Date   WBC 10.5 05/31/2014   HGB 14.2 05/31/2014   HCT 40.7 05/31/2014   MCV 93.1 05/31/2014   PLT 224 05/31/2014   Lab Results   Component Value Date   NA 136 05/31/2014   K 5.0 05/31/2014   CL 102 05/31/2014   CO2 25 05/31/2014   BUN 31* 05/31/2014   CREATININE 1.22 05/31/2014   GLUCOSE 191* 05/31/2014   Lab Results  Component Value Date   INR 1.06 05/31/2014    Assessment:   Cervical neck pain with herniated nucleus pulposus/ spondylosis/ stenosis at C2-C4. Patient has failed conservative therapy. Planned surgery : Cervical laminectomy with cervical fusion C2-C4.  Plan:   I explained the condition and procedure to the patient and answered any questions.  Patient wishes to proceed with procedure as planned. Understands risks/ benefits/ and expected or typical outcomes.  Amand Lemoine S 06/09/2014 12:24 PM

## 2014-06-09 NOTE — Transfer of Care (Signed)
Immediate Anesthesia Transfer of Care Note  Patient: Luke [name]  Procedure(s) Performed: Procedure(s) with comments: Laminectomy - Cervical two-Cervcial four posterior cervical instrumented fusion Cervical two-cervical four (N/A) - posterior   Patient Location: PACU  Anesthesia Type:General  Level of Consciousness: awake, alert  and oriented  Airway & Oxygen Therapy: Patient Spontanous Breathing and Patient connected to face mask oxygen  Post-op Assessment: Report given to RN and Post -op Vital signs reviewed and stable  Post vital signs: Reviewed and stable  Last Vitals:  Filed Vitals:   06/09/14 1532  BP:   Pulse:   Temp: 36.2 C  Resp:     Complications: No apparent anesthesia complications

## 2014-06-10 LAB — GLUCOSE, CAPILLARY
Glucose-Capillary: 169 mg/dL — ABNORMAL HIGH (ref 70–99)
Glucose-Capillary: 154 mg/dL — ABNORMAL HIGH (ref 70–99)
Glucose-Capillary: 198 mg/dL — ABNORMAL HIGH (ref 70–99)

## 2014-06-10 NOTE — Progress Notes (Signed)
PT Cancellation Note  Patient Details Name: Luke [name] MRN: [id number] DOB: 07/17/1945   Cancelled Treatment:    Reason Eval/Treat Not Completed: PT screened, no needs identified, will sign off   Irwin Brakeman F 06/10/2014, 9:23 AM [facility] [phone] (pager)

## 2014-06-10 NOTE — Evaluation (Signed)
Occupational Therapy Evaluation Patient Details Name: Luke [name] MRN: [id number] DOB: [birth year] Today's Date: 06/10/2014    History of Present Illness Pt is a 69 y.o. Male s/p Posterior cervical fusion/foraminotomy level 2.    Clinical Impression   PTA pt lived at home and was independent with ADLs. Pt is active and enjoys exercising regularly. Pt is currently at Supervision level for ADLs and will have assistance at home from family. All education and training completed for safety with ADLs. No further acute OT needs.     Follow Up Recommendations  No OT follow up;Supervision/Assistance - 24 hour    Equipment Recommendations  None recommended by OT    Recommendations for Other Services       Precautions / Restrictions Precautions Precautions: Cervical Precaution Comments: Provided pt with cervical handout and educated on incorporating precautions into ADLs.  Restrictions Weight Bearing Restrictions: No      Mobility Bed Mobility Overal bed mobility: Needs Assistance Bed Mobility: Rolling;Sidelying to Sit;Sit to Sidelying Rolling: Supervision Sidelying to sit: Supervision     Sit to sidelying: Supervision General bed mobility comments: VC's for technique and supervision for safety.   Transfers Overall transfer level: Needs assistance   Transfers: Sit to/from Stand Sit to Stand: Supervision         General transfer comment: Supervision for safety.     Balance Overall balance assessment: No apparent balance deficits (not formally assessed)                                          ADL Overall ADL's : Needs assistance/impaired                                       General ADL Comments: Pt overall at Supervision level for ADLs. Educated pt on incorporating cervical precautions into ADLs via compensatory techniques. Pt demonstrated donning/doffing socks through figure four method.                Pertinent Vitals/Pain  Pain Assessment: 0-10 Pain Score: 6  Pain Location: neck, surgical Pain Descriptors / Indicators: Aching Pain Intervention(s): Monitored during session;Repositioned     Hand Dominance Right   Extremity/Trunk Assessment Upper Extremity Assessment Upper Extremity Assessment: Overall WFL for tasks assessed   Lower Extremity Assessment Lower Extremity Assessment: Overall WFL for tasks assessed   Cervical / Trunk Assessment Cervical / Trunk Assessment: Other exceptions (head forward)   Communication Communication Communication: No difficulties   Cognition Arousal/Alertness: Awake/alert Behavior During Therapy: WFL for tasks assessed/performed Overall Cognitive Status: Within Functional Limits for tasks assessed                                Home Living Family/patient expects to be discharged to:: Private residence Living Arrangements: Spouse/significant other   Type of Home: House Home Access: Stairs to enter Technical brewer of Steps: 3   Home Layout: One level     Bathroom Shower/Tub: Tub/shower unit Shower/tub characteristics: Architectural technologist: Standard     Home Equipment: Grab bars - tub/shower          Prior Functioning/Environment Level of Independence: Independent        Comments: pt is active and enjoys exercising    OT Diagnosis:  Generalized weakness;Acute pain    End of Session Nurse Communication: Patient requests pain meds  Activity Tolerance: Patient tolerated treatment well Patient left: in bed;with call bell/phone within reach   Time: 0855-0910 OT Time Calculation (min): 15 min Charges:  OT General Charges $OT Visit: 1 Procedure OT Evaluation $Initial OT Evaluation Tier I: 1 Procedure G-Codes:    Juluis Rainier June 14, 2014, 9:24 AM  Cyndie Chime, OTR/L Occupational Therapist [phone] (pager)

## 2014-06-10 NOTE — Progress Notes (Signed)
Pt doing well. Pt given D/C instructions with Rx, verbal understanding was provided. Pt's IV was removed prior to D/C. Pt's incision is clean and dry with no sign of infection. Pt D/C'd home via wheelchair @ 1720 per MD order. Pt is stable @ D/C and has no other needs at this time. Holli Humbles, RN

## 2014-06-10 NOTE — Progress Notes (Signed)
Subjective: Patient reports he's doing well, no neck pain unless he gets up, no arm pain or NTW  Objective: Vital signs in last 24 hours: Temp:  [97.2 F (36.2 C)-98.2 F (36.8 C)] 97.9 F (36.6 C) (02/05 0743) Pulse Rate:  [63-83] 73 (02/05 0743) Resp:  [10-20] 18 (02/05 0743) BP: (156-184)/(64-91) 177/84 mmHg (02/05 0743) SpO2:  [93 %-100 %] 94 % (02/05 0743) Arterial Line BP: (162-168)/(65-70) 163/65 mmHg (02/04 1602) Weight:  [195 lb (88.451 kg)] 195 lb (88.451 kg) (02/04 1007)  Intake/Output from previous day: 02/04 0730 - 02/05 0729 In: 2160 [P.O.:360; I.V.:1800] Out: 275 [Drains:75; Blood:200] Intake/Output this shift:    Neurologic: Grossly normal  Lab Results: Lab Results  Component Value Date   WBC 10.5 05/31/2014   HGB 14.2 05/31/2014   HCT 40.7 05/31/2014   MCV 93.1 05/31/2014   PLT 224 05/31/2014   Lab Results  Component Value Date   INR 1.06 05/31/2014   BMET Lab Results  Component Value Date   NA 136 05/31/2014   K 5.0 05/31/2014   CL 102 05/31/2014   CO2 25 05/31/2014   GLUCOSE 191* 05/31/2014   BUN 31* 05/31/2014   CREATININE 1.22 05/31/2014   CALCIUM 10.1 05/31/2014    Studies/Results: Dg Cervical Spine 2-3 Views  06/09/2014   CLINICAL DATA:  Posterior cervical laminectomy and fusion C2-C4  EXAM: DG C-ARM 61-120 MIN; CERVICAL SPINE - 2-3 VIEW  TECHNIQUE: Two digital C-arm fluoroscopic images obtained intraoperatively are submitted for postoperative interpretation.  CONTRAST:  None  FLUOROSCOPY TIME:  Radiation Exposure Index (as provided by the fluoroscopic device): N/A  If the device does not provide the exposure index:  Fluoroscopy Time (in minutes and seconds):  0 min 17 seconds  Number of Acquired Images:  2  COMPARISON:  CT cervical spine 06/24/2013  FINDINGS: Significant diffuse osseous demineralization.  BILATERAL pedicle screws and posterior bars at C2-C4 following posterior cervical fusion.  Vertebral body and disc space heights  maintained.  No fracture or subluxation.  IMPRESSION: Posterior C2-C4 fusion.   Electronically Signed   By: Lavonia Dana M.D.   On: 06/09/2014 15:54   Dg C-arm 1-60 Min  06/09/2014   CLINICAL DATA:  Posterior cervical laminectomy and fusion C2-C4  EXAM: DG C-ARM 61-120 MIN; CERVICAL SPINE - 2-3 VIEW  TECHNIQUE: Two digital C-arm fluoroscopic images obtained intraoperatively are submitted for postoperative interpretation.  CONTRAST:  None  FLUOROSCOPY TIME:  Radiation Exposure Index (as provided by the fluoroscopic device): N/A  If the device does not provide the exposure index:  Fluoroscopy Time (in minutes and seconds):  0 min 17 seconds  Number of Acquired Images:  2  COMPARISON:  CT cervical spine 06/24/2013  FINDINGS: Significant diffuse osseous demineralization.  BILATERAL pedicle screws and posterior bars at C2-C4 following posterior cervical fusion.  Vertebral body and disc space heights maintained.  No fracture or subluxation.  IMPRESSION: Posterior C2-C4 fusion.   Electronically Signed   By: Lavonia Dana M.D.   On: 06/09/2014 15:54    Assessment/Plan: doing well, home later today or tomorrow.   LOS: 1 day    Luke [name] S 06/10/2014, 7:46 AM

## 2014-06-11 NOTE — Discharge Summary (Signed)
Physician Discharge Summary  Patient ID: Luke [name] MRN: [id number] DOB/AGE: 1945-07-18 69 y.o.  Admit date: 06/09/2014 Discharge date: 06/11/2014  Admission Diagnoses: cervical OPLL/ stenosis   Discharge Diagnoses: same   Discharged Condition: good  Hospital Course: The patient was admitted on 06/09/2014 and taken to the operating room where the patient underwent CL/ PCF C2-4. The patient tolerated the procedure well and was taken to the recovery room and then to the floor in stable condition. The hospital course was routine. There were no complications. The wound remained clean dry and intact. Pt had appropriate neck soreness. No complaints of arm pain or new N/T/W. The patient remained afebrile with stable vital signs, and tolerated a regular diet. The patient continued to increase activities, and pain was well controlled with oral pain medications.   Consults: none  Significant Diagnostic Studies:  Results for orders placed or performed during the hospital encounter of 06/09/14  Glucose, capillary  Result Value Ref Range   Glucose-Capillary 245 (H) 70 - 99 mg/dL  Glucose, capillary  Result Value Ref Range   Glucose-Capillary 242 (H) 70 - 99 mg/dL   Comment 1 Notify RN   Glucose, capillary  Result Value Ref Range   Glucose-Capillary 240 (H) 70 - 99 mg/dL   Comment 1 Documented in Chart    Comment 2 Notify RN   Glucose, capillary  Result Value Ref Range   Glucose-Capillary 300 (H) 70 - 99 mg/dL   Comment 1 Notify RN    Comment 2 Documented in Chart   Glucose, capillary  Result Value Ref Range   Glucose-Capillary 234 (H) 70 - 99 mg/dL   Comment 1 Notify RN    Comment 2 Documented in Chart   Glucose, capillary  Result Value Ref Range   Glucose-Capillary 169 (H) 70 - 99 mg/dL   Comment 1 Notify RN    Comment 2 Documented in Chart   Glucose, capillary  Result Value Ref Range   Glucose-Capillary 154 (H) 70 - 99 mg/dL  Glucose, capillary  Result Value Ref Range   Glucose-Capillary 198 (H) 70 - 99 mg/dL    Dg Cervical Spine 2-3 Views  06/09/2014   CLINICAL DATA:  Posterior cervical laminectomy and fusion C2-C4  EXAM: DG C-ARM 61-120 MIN; CERVICAL SPINE - 2-3 VIEW  TECHNIQUE: Two digital C-arm fluoroscopic images obtained intraoperatively are submitted for postoperative interpretation.  CONTRAST:  None  FLUOROSCOPY TIME:  Radiation Exposure Index (as provided by the fluoroscopic device): N/A  If the device does not provide the exposure index:  Fluoroscopy Time (in minutes and seconds):  0 min 17 seconds  Number of Acquired Images:  2  COMPARISON:  CT cervical spine 06/24/2013  FINDINGS: Significant diffuse osseous demineralization.  BILATERAL pedicle screws and posterior bars at C2-C4 following posterior cervical fusion.  Vertebral body and disc space heights maintained.  No fracture or subluxation.  IMPRESSION: Posterior C2-C4 fusion.   Electronically Signed   By: Lavonia Dana M.D.   On: 06/09/2014 15:54   Dg C-arm 1-60 Min  06/09/2014   CLINICAL DATA:  Posterior cervical laminectomy and fusion C2-C4  EXAM: DG C-ARM 61-120 MIN; CERVICAL SPINE - 2-3 VIEW  TECHNIQUE: Two digital C-arm fluoroscopic images obtained intraoperatively are submitted for postoperative interpretation.  CONTRAST:  None  FLUOROSCOPY TIME:  Radiation Exposure Index (as provided by the fluoroscopic device): N/A  If the device does not provide the exposure index:  Fluoroscopy Time (in minutes and seconds):  0 min 17 seconds  Number of Acquired Images:  2  COMPARISON:  CT cervical spine 06/24/2013  FINDINGS: Significant diffuse osseous demineralization.  BILATERAL pedicle screws and posterior bars at C2-C4 following posterior cervical fusion.  Vertebral body and disc space heights maintained.  No fracture or subluxation.  IMPRESSION: Posterior C2-C4 fusion.   Electronically Signed   By: Lavonia Dana M.D.   On: 06/09/2014 15:54    Antibiotics:  Anti-infectives    Start     Dose/Rate Route Frequency  Ordered Stop   06/09/14 2100  ceFAZolin (ANCEF) IVPB 1 g/50 mL premix     1 g100 mL/hr over 30 Minutes Intravenous Every 8 hours 06/09/14 1704 06/10/14 0456   06/09/14 1340  bacitracin 50,000 Units in sodium chloride irrigation 0.9 % 500 mL irrigation  Status:  Discontinued       As needed 06/09/14 1341 06/09/14 1526   06/09/14 0600  ceFAZolin (ANCEF) IVPB 2 g/50 mL premix     2 g100 mL/hr over 30 Minutes Intravenous On call to O.R. 06/08/14 1358 06/09/14 1303      Discharge Exam: Blood pressure 158/86, pulse 67, temperature 98 F (36.7 C), temperature source Oral, resp. rate 16, height 6\' 1"  (1.854 m), weight 195 lb (88.451 kg), SpO2 94 %. Good strength, ambulating well Dressing dry  Discharge Medications:     Medication List    ASK your doctor about these medications        amLODipine 2.5 MG tablet  Commonly known as:  NORVASC  Take 1 tablet (2.5 mg total) by mouth daily.     aspirin EC 81 MG tablet  Take 81 mg by mouth daily.     carvedilol 25 MG tablet  Commonly known as:  COREG  Take 1 tablet (25 mg total) by mouth 2 (two) times daily with a meal.     glipiZIDE 5 MG tablet  Commonly known as:  GLUCOTROL  Take 5 mg by mouth 2 (two) times daily before a meal.     glucose blood test strip  Commonly known as:  ACCU-CHEK AVIVA PLUS  Use to check blood sugar three times a day, 2 hours after meals. DX: E11.51     lisinopril 20 MG tablet  Commonly known as:  PRINIVIL,ZESTRIL  Take 1 tablet (20 mg total) by mouth 2 (two) times daily.     metFORMIN 500 MG tablet  Commonly known as:  GLUCOPHAGE  take 2 tablets by mouth twice a day with food     multivitamin tablet  Take 1 tablet by mouth daily.     sildenafil 20 MG tablet  Commonly known as:  REVATIO  TAKE THREE TO FOUR TABLETS BY MOUTH DAILY AS NEEDED     simvastatin 40 MG tablet  Commonly known as:  ZOCOR  take 1 tablet by mouth at bedtime        Disposition: home   Final Dx: posterior cervical  decompression and fusion C2-4       Signed: Danayah Smyre S 06/11/2014, 9:28 AM

## 2014-06-13 ENCOUNTER — Encounter (HOSPITAL_COMMUNITY): Payer: Self-pay | Admitting: Neurological Surgery

## 2014-06-16 ENCOUNTER — Other Ambulatory Visit: Payer: Self-pay | Admitting: Family Medicine

## 2014-06-16 DIAGNOSIS — E1159 Type 2 diabetes mellitus with other circulatory complications: Secondary | ICD-10-CM

## 2014-06-17 ENCOUNTER — Other Ambulatory Visit: Payer: Medicare Other

## 2014-06-24 ENCOUNTER — Other Ambulatory Visit (INDEPENDENT_AMBULATORY_CARE_PROVIDER_SITE_OTHER): Payer: Medicare Other

## 2014-06-24 ENCOUNTER — Ambulatory Visit: Payer: Medicare Other | Admitting: Family Medicine

## 2014-06-24 DIAGNOSIS — E1151 Type 2 diabetes mellitus with diabetic peripheral angiopathy without gangrene: Secondary | ICD-10-CM | POA: Diagnosis not present

## 2014-06-24 DIAGNOSIS — E1159 Type 2 diabetes mellitus with other circulatory complications: Secondary | ICD-10-CM

## 2014-06-24 LAB — HEMOGLOBIN A1C: Hgb A1c MFr Bld: 8.2 % — ABNORMAL HIGH (ref 4.6–6.5)

## 2014-07-01 ENCOUNTER — Ambulatory Visit (INDEPENDENT_AMBULATORY_CARE_PROVIDER_SITE_OTHER): Payer: Medicare Other | Admitting: Family Medicine

## 2014-07-01 ENCOUNTER — Encounter: Payer: Self-pay | Admitting: Family Medicine

## 2014-07-01 VITALS — BP 142/70 | HR 69 | Temp 97.3°F | Wt 189.8 lb

## 2014-07-01 DIAGNOSIS — E1151 Type 2 diabetes mellitus with diabetic peripheral angiopathy without gangrene: Secondary | ICD-10-CM

## 2014-07-01 DIAGNOSIS — E1159 Type 2 diabetes mellitus with other circulatory complications: Secondary | ICD-10-CM

## 2014-07-01 MED ORDER — GLIPIZIDE 5 MG PO TABS: ORAL_TABLET | ORAL | Status: DC

## 2014-07-01 NOTE — Patient Instructions (Signed)
Increase the glipizide in the AM.  Get back to exercising (gradually). Recheck labs before a physical in about 3 months.   Take care.  Glad to see you.

## 2014-07-01 NOTE — Progress Notes (Signed)
Pre visit review using our clinic review tool, if applicable. No additional management support is needed unless otherwise documented below in the visit note.  Pain controlled, post op.  Had f/u with surgery clinic, doing well per their report.   He has been out of the gym recently, but is planning on easing back into exercise with surgery's permission.    He has some aches (possibly from neuropathy) in his foot, those resolved with 20-40mg  sildenafil.    DM2 f/u.  A1c up.  Recent sugery noted.  A1c 8.2.  Not on insulin now but needed prn insulin in the hospital.  See above re: exercise.  He continues to work on his diet, with sugar generally controlled unless he has sig carb intake.   Meds, vitals, and allergies reviewed.   ROS: See HPI.  Otherwise, noncontributory.  GEN: nad, alert and oriented HEENT: mucous membranes moist NECK: supple w/o LA CV: rrr. PULM: ctab, no inc wob ABD: soft, +bs EXT: no edema SKIN: no acute rash, neck scar-posterior- healing well.    Diabetic foot exam: Normal inspection except for dry skin.  No skin breakdown Calluses noted.  Dec DP pulses B, at baseline.  Normal sensation to light touch and monofilament Nails thickened

## 2014-07-03 NOTE — Assessment & Plan Note (Signed)
A1c up, but hasn't been able to exercise.  He'll continue work on diet, get back to exercising, no change in meds other than an increase the glipizide in the AM (10mg  AM, 5mg  PM).  Recheck in about 3 months.  He agrees.  Fu prn o/w.

## 2014-07-15 ENCOUNTER — Other Ambulatory Visit: Payer: Self-pay | Admitting: Family Medicine

## 2014-07-18 ENCOUNTER — Ambulatory Visit (INDEPENDENT_AMBULATORY_CARE_PROVIDER_SITE_OTHER): Payer: Medicare Other | Admitting: *Deleted

## 2014-07-18 DIAGNOSIS — I5022 Chronic systolic (congestive) heart failure: Secondary | ICD-10-CM

## 2014-07-18 DIAGNOSIS — I255 Ischemic cardiomyopathy: Secondary | ICD-10-CM

## 2014-07-18 LAB — MDC_IDC_ENUM_SESS_TYPE_REMOTE
Battery Remaining Longevity: 64 mo
Battery Remaining Percentage: 73 %
Battery Voltage: 2.98 V
Brady Statistic AP VP Percent: 7.6 %
Brady Statistic AP VS Percent: 1 %
Brady Statistic AS VP Percent: 92 %
Brady Statistic AS VS Percent: 1 %
Brady Statistic RA Percent Paced: 7.4 %
Date Time Interrogation Session: 20160314060019
HighPow Impedance: 78 Ohm
HighPow Impedance: 78 Ohm
Implantable Pulse Generator Serial Number: 7097007
Lead Channel Impedance Value: 1150 Ohm
Lead Channel Impedance Value: 450 Ohm
Lead Channel Impedance Value: 450 Ohm
Lead Channel Pacing Threshold Amplitude: 0.75 V
Lead Channel Pacing Threshold Amplitude: 0.75 V
Lead Channel Pacing Threshold Amplitude: 1.25 V
Lead Channel Pacing Threshold Pulse Width: 0.5 ms
Lead Channel Pacing Threshold Pulse Width: 0.5 ms
Lead Channel Pacing Threshold Pulse Width: 1 ms
Lead Channel Sensing Intrinsic Amplitude: 11.9 mV
Lead Channel Sensing Intrinsic Amplitude: 4 mV
Lead Channel Setting Pacing Amplitude: 2 V
Lead Channel Setting Pacing Amplitude: 2 V
Lead Channel Setting Pacing Amplitude: 2.5 V
Lead Channel Setting Pacing Pulse Width: 0.5 ms
Lead Channel Setting Pacing Pulse Width: 1 ms
Lead Channel Setting Sensing Sensitivity: 0.5 mV
Zone Setting Detection Interval: 250 ms
Zone Setting Detection Interval: 300 ms

## 2014-07-18 NOTE — Progress Notes (Signed)
Remote ICD transmission.   

## 2014-07-25 DIAGNOSIS — I10 Essential (primary) hypertension: Secondary | ICD-10-CM | POA: Diagnosis not present

## 2014-07-25 DIAGNOSIS — M4712 Other spondylosis with myelopathy, cervical region: Secondary | ICD-10-CM | POA: Diagnosis not present

## 2014-08-09 ENCOUNTER — Encounter: Payer: Self-pay | Admitting: Cardiology

## 2014-08-17 ENCOUNTER — Encounter: Payer: Self-pay | Admitting: Internal Medicine

## 2014-08-29 ENCOUNTER — Ambulatory Visit (INDEPENDENT_AMBULATORY_CARE_PROVIDER_SITE_OTHER): Payer: Medicare Other | Admitting: Cardiology

## 2014-08-29 ENCOUNTER — Encounter: Payer: Self-pay | Admitting: Cardiology

## 2014-08-29 VITALS — BP 160/82 | HR 79 | Ht 73.0 in | Wt 193.8 lb

## 2014-08-29 DIAGNOSIS — R0989 Other specified symptoms and signs involving the circulatory and respiratory systems: Secondary | ICD-10-CM | POA: Diagnosis not present

## 2014-08-29 DIAGNOSIS — I255 Ischemic cardiomyopathy: Secondary | ICD-10-CM

## 2014-08-29 DIAGNOSIS — R9430 Abnormal result of cardiovascular function study, unspecified: Secondary | ICD-10-CM

## 2014-08-29 DIAGNOSIS — Z981 Arthrodesis status: Secondary | ICD-10-CM | POA: Diagnosis not present

## 2014-08-29 DIAGNOSIS — I2510 Atherosclerotic heart disease of native coronary artery without angina pectoris: Secondary | ICD-10-CM | POA: Diagnosis not present

## 2014-08-29 MED ORDER — AMLODIPINE BESYLATE 2.5 MG PO TABS: 2.5000 mg | ORAL_TABLET | Freq: Every day | ORAL | Status: DC

## 2014-08-29 MED ORDER — CARVEDILOL 25 MG PO TABS: 25.0000 mg | ORAL_TABLET | Freq: Two times a day (BID) | ORAL | Status: DC

## 2014-08-29 NOTE — Progress Notes (Signed)
Cardiology Office Note   Date:  08/29/2014   ID:  Luke [name], DOB 15-May-1945, MRN [id number]  PCP:  Luke Stain, MD  Cardiologist:  Luke Argyle, MD   Chief Complaint  Patient presents with  . Appointment    Follow-up coronary artery disease      History of Present Illness: Luke [name] is a 69 y.o. male who presents today to follow-up coronary disease and cardiomyopathy. When I saw him last in October, 2015 we made a decision to clear him for cervical disc surgery. This has been done and he is feeling much better. He still has numbness in his hands which will hopefully slowly improved. Overall his activity level is much better. He is really remarkably active. He is on appropriate medications for his cardiomyopathy. He has no signs of ongoing significant CHF. He has no chest pain.    Past Medical History  Diagnosis Date  . Diabetes mellitus     type II  . PAD (peripheral artery disease)   . AAA (abdominal aortic aneurysm) 2011    Per vascular surgery  . HLD (hyperlipidemia)   . Hypertension     white coat HTN-- often elevated in office and controlled on outside checks.  . Low testosterone     Hx of  . Cardiomyopathy     Nuclear, October 09, 2011, EF 30%, multiple focal wall motion abnormalities  . Ejection fraction < 50%     EF 30%, nuclear, October 09, 2011  . LBBB (left bundle branch block)     LBBB on EKG October 11, 2011,  no prior EKG has been done  . CAD (coronary artery disease)     Presumed CAD with nuclear scan October 09, 2011,  large anteroseptal MI and inferior MI. Catheterization scheduled October 15, 2011  . Anxiety     no med. in use for anxiety but pt. speaks openly of his stress & anxious feelings regarding impending surgery    . ICD (implantable cardioverter-defibrillator) in place     CRT-D placed March, 2014 complete heart block and k dysfunction  . Ejection fraction < 50%     EF previously 30%  //   EF 30-35%, echo, July 14, 2012, severe diffuse  hypokinesis, PA pressure 43 mm mercury  . Pacemaker   . Myocardial infarction   . Drug therapy     Hyperkalemia with spironolactone  . Automatic implantable cardioverter-defibrillator in situ   . Fall due to ice or snow Feb.  19, 2015  . Atrial fibrillation     Past Surgical History  Procedure Laterality Date  . Tonsillectomy      as a child   . Colonoscopy    . Cardiac catheterization    . Abdominal aortic aneurysm repair      EVAR   . Pacemaker insertion  07-16-12    pacemaker/defibrilator  . Umbilical hernia repair N/A 05/14/2013    Procedure: LAPAROSCOPIC exploration and repair of hernia in abdominal ;  Surgeon: Adin Hector, MD;  Location: [place];  Service: General;  Laterality: N/A;  . Insertion of mesh N/A 05/14/2013    Procedure: INSERTION OF MESH;  Surgeon: Adin Hector, MD;  Location: [facility];  Service: General;  Laterality: N/A;  . Hernia repair  Jan. 9, 2015  . Abdominal aortagram N/A 10/28/2011    Procedure: ABDOMINAL [name];  Surgeon: Angelia Mould, MD;  Location: [facility] CATH LAB;  Service: Cardiovascular;  Laterality: N/A;  . Lower extremity  angiogram Bilateral 10/28/2011    Procedure: LOWER EXTREMITY ANGIOGRAM;  Surgeon: Angelia Mould, MD;  Location: [facility] CATH LAB;  Service: Cardiovascular;  Laterality: Bilateral;  . Bi-ventricular implantable cardioverter defibrillator N/A 07/16/2012    Procedure: BI-VENTRICULAR IMPLANTABLE CARDIOVERTER DEFIBRILLATOR  (CRT-D);  Surgeon: Deboraha Sprang, MD;  Location: [facility] CATH LAB;  Service: Cardiovascular;  Laterality: N/A;  . Posterior cervical fusion/foraminotomy N/A 06/09/2014    Procedure: Laminectomy - Cervical two-Cervcial four posterior cervical instrumented fusion Cervical two-cervical four;  Surgeon: Eustace Moore, MD;  Location: [place] [facility];  Service: Neurosurgery;  Laterality: N/A;  posterior     Patient Active Problem List   Diagnosis Date Noted  . S/P cervical spinal fusion 06/09/2014  . Aftercare following  surgery of the circulatory system, [place] 01/05/2014  . Preop cardiovascular exam 08/13/2013  . Central cord syndrome 06/24/2013  . Encounter for fitting or adjustment of automatic implantable cardioverter-defibrillator 04/20/2013  . Drug therapy   . Umbilical hernia, incarcerated s/p lap repair with mesh 05/14/2013 03/19/2013  . Status post abdominal aortic aneurysm repair 12/30/2012  . HLD (hyperlipidemia) 09/16/2012  . Ejection fraction < 50%   . Complete heart block 07/14/2012  . SK (seborrheic keratosis) 02/12/2012  . Shoulder pain 11/08/2011  . CAD (coronary artery disease)   . Ischemic cardiomyopathy   . LBBB (left bundle branch block)   . PVD (peripheral vascular disease) 10/08/2010  . ORGANIC IMPOTENCE 07/05/2010  . ESSENTIAL HYPERTENSION, BENIGN 02/27/2010  . Type 2 diabetes mellitus with vascular disease 11/16/2009      Current Outpatient Prescriptions  Medication Sig Dispense Refill  . amLODipine (NORVASC) 2.5 MG tablet Take 1 tablet (2.5 mg total) by mouth daily. 30 tablet 6  . aspirin EC 81 MG tablet Take 81 mg by mouth daily.    . carvedilol (COREG) 25 MG tablet Take 1 tablet (25 mg total) by mouth 2 (two) times daily with a meal. 60 tablet 9  . glipiZIDE (GLUCOTROL) 5 MG tablet 2 tabs in AM and 1 tab in PM. 270 tablet 3  . glucose blood (ACCU-CHEK AVIVA PLUS) test strip Use to check blood sugar three times a day, 2 hours after meals. DX: E11.51 300 each 1  . lisinopril (PRINIVIL,ZESTRIL) 20 MG tablet Take 1 tablet (20 mg total) by mouth 2 (two) times daily. 180 tablet 1  . metFORMIN (GLUCOPHAGE) 500 MG tablet take 2 tablets by mouth twice a day with food (Patient taking differently: take 2 tablet by mouth twice a day with food) 360 tablet 1  . Multiple Vitamin (MULTIVITAMIN) tablet Take 1 tablet by mouth daily.     . sildenafil (REVATIO) 20 MG tablet TAKE THREE TO FOUR TABLETS BY MOUTH DAILY AS NEEDED (Patient taking differently: Take 60-80 mg by mouth daily as needed. TAKE  THREE TO FOUR TABLETS BY MOUTH DAILY AS NEEDED, for neuropathy in feet.Marland Kitchen)    . sildenafil (REVATIO) 20 MG tablet TAKE THREE TO FOUR TABLETS BY MOUTH ONCE DAILY 50 tablet 0  . simvastatin (ZOCOR) 40 MG tablet take 1 tablet by mouth at bedtime 90 tablet 3   No current facility-administered medications for this visit.    Allergies:   Aldactone and Codeine    Social History:  The patient  reports that he quit smoking about 10 years ago. His smoking use included Cigarettes. He has a 80 pack-year smoking history. He has never used smokeless tobacco. He reports that he drinks about 1.2 oz of alcohol per week. He reports  that he does not use illicit drugs.   Family History:  The patient's family history includes Cancer in his father; Diabetes in his sister; Heart attack in his sister; Heart disease in his sister; Hyperlipidemia in his mother and sister; Hypertension in his mother, sister, and son; Stroke in his mother. There is no history of Colon cancer or Prostate cancer.    ROS:  Please see the history of present illness.  Patient denies fever, chills, headache, sweats, rash, change in vision, change in hearing, chest pain, cough, nausea or vomiting, urinary symptoms. All other systems are reviewed and are negative.      PHYSICAL EXAM: VS:  BP 160/82 mmHg  Pulse 79  Ht 6\' 1"  (1.854 m)  Wt 193 lb 12.8 oz (87.907 kg)  BMI 25.57 kg/m2  SpO2 97% , Blood pressure at home is 127/84. Patient is oriented to person time and place. Affect is normal. Head is atraumatic. Sclera and conjunctiva are normal. His skin is tanned. There is no drooling venous distention. Lungs are clear. Respiratory effort is not labored. Cardiac exam reveals S1 and S2. Abdomen is soft. There is no peripheral edema. There are no musculoskeletal deformities. There are no skin rashes.  EKG:   EKG is not done today.  Recent Labs: 05/31/2014: BUN 31*; Creatinine 1.22; Hemoglobin 14.2; Platelets 224; Potassium 5.0; Sodium 136     Lipid Panel    Component Value Date/Time   CHOL 134 09/13/2013 0847   TRIG 222.0* 09/13/2013 0847   HDL 36.90* 09/13/2013 0847   CHOLHDL 4 09/13/2013 0847   VLDL 44.4* 09/13/2013 0847   LDLCALC 53 09/13/2013 0847   LDLDIRECT 85.5 07/30/2011 0839      Wt Readings from Last 3 Encounters:  08/29/14 193 lb 12.8 oz (87.907 kg)  07/01/14 189 lb 12 oz (86.07 kg)  06/09/14 195 lb (88.451 kg)      Current medicines are reviewed  The patient understands his medications.   ASSESSMENT AND PLAN:

## 2014-08-29 NOTE — Assessment & Plan Note (Signed)
He had successful surgery and he is improving. No change in therapy.

## 2014-08-29 NOTE — Assessment & Plan Note (Signed)
EF was 30-35% by echo, March, 2014. He is on appropriate medications. No change in therapy.

## 2014-08-29 NOTE — Patient Instructions (Signed)
**Note De-Identified Ercole Georg Obfuscation** Medication Instructions:  None  Labwork: None  Testing/Procedures: None  Follow-Up: Your physician wants you to follow-up in: 6 to 7 months. You will receive a reminder letter in the mail two months in advance. If you don't receive a letter, please call our office to schedule the follow-up appointment.

## 2014-08-29 NOTE — Assessment & Plan Note (Signed)
Coronary disease is stable. His last cath was 2013. He had moderate disease but no obstructive findings. PCI was not needed. He is not having symptoms. No further workup.

## 2014-09-06 ENCOUNTER — Other Ambulatory Visit: Payer: Self-pay | Admitting: Family Medicine

## 2014-09-28 ENCOUNTER — Other Ambulatory Visit: Payer: Self-pay | Admitting: Family Medicine

## 2014-09-28 DIAGNOSIS — E1159 Type 2 diabetes mellitus with other circulatory complications: Secondary | ICD-10-CM

## 2014-09-29 ENCOUNTER — Other Ambulatory Visit: Payer: Self-pay | Admitting: Family Medicine

## 2014-09-29 NOTE — Telephone Encounter (Signed)
Received refill request electronically from pharmacy. Last refill 07/15/14 #50 Last office visit 07/01/14 Is it okay to refill medication?

## 2014-09-29 NOTE — Telephone Encounter (Signed)
Sent. Thanks.   

## 2014-10-12 ENCOUNTER — Other Ambulatory Visit (INDEPENDENT_AMBULATORY_CARE_PROVIDER_SITE_OTHER): Payer: Medicare Other

## 2014-10-12 DIAGNOSIS — E785 Hyperlipidemia, unspecified: Secondary | ICD-10-CM | POA: Diagnosis not present

## 2014-10-12 DIAGNOSIS — E1159 Type 2 diabetes mellitus with other circulatory complications: Secondary | ICD-10-CM

## 2014-10-12 DIAGNOSIS — E1151 Type 2 diabetes mellitus with diabetic peripheral angiopathy without gangrene: Secondary | ICD-10-CM

## 2014-10-12 LAB — COMPREHENSIVE METABOLIC PANEL
ALT: 21 U/L (ref 0–53)
AST: 19 U/L (ref 0–37)
Albumin: 4.2 g/dL (ref 3.5–5.2)
Alkaline Phosphatase: 66 U/L (ref 39–117)
BUN: 20 mg/dL (ref 6–23)
CO2: 29 mEq/L (ref 19–32)
Calcium: 9.5 mg/dL (ref 8.4–10.5)
Chloride: 103 mEq/L (ref 96–112)
Creatinine, Ser: 1.15 mg/dL (ref 0.40–1.50)
GFR: 67.21 mL/min (ref >60.00)
Glucose, Bld: 220 mg/dL — ABNORMAL HIGH (ref 70–99)
Potassium: 4.9 mEq/L (ref 3.5–5.1)
Sodium: 135 mEq/L (ref 135–145)
Total Bilirubin: 0.4 mg/dL (ref 0.2–1.2)
Total Protein: 7.1 g/dL (ref 6.0–8.3)

## 2014-10-12 LAB — LIPID PANEL
Cholesterol: 166 mg/dL (ref 0–200)
HDL: 32.9 mg/dL — ABNORMAL LOW (ref >39.00)
NonHDL: 133.1
Total CHOL/HDL Ratio: 5
Triglycerides: 309 mg/dL — ABNORMAL HIGH (ref 0.0–149.0)
VLDL: 61.8 mg/dL — ABNORMAL HIGH (ref 0.0–40.0)

## 2014-10-12 LAB — HEMOGLOBIN A1C: Hgb A1c MFr Bld: 8 % — ABNORMAL HIGH (ref 4.6–6.5)

## 2014-10-12 LAB — LDL CHOLESTEROL, DIRECT: Direct LDL: 70 mg/dL

## 2014-10-17 ENCOUNTER — Ambulatory Visit (INDEPENDENT_AMBULATORY_CARE_PROVIDER_SITE_OTHER): Payer: Medicare Other | Admitting: *Deleted

## 2014-10-17 ENCOUNTER — Other Ambulatory Visit: Payer: Self-pay | Admitting: Internal Medicine

## 2014-10-17 DIAGNOSIS — I255 Ischemic cardiomyopathy: Secondary | ICD-10-CM

## 2014-10-17 DIAGNOSIS — I5022 Chronic systolic (congestive) heart failure: Secondary | ICD-10-CM

## 2014-10-17 LAB — CUP PACEART REMOTE DEVICE CHECK
Battery Remaining Longevity: 60 mo
Battery Remaining Percentage: 70 %
Battery Voltage: 2.96 V
Brady Statistic AP VP Percent: 9.1 %
Brady Statistic AP VS Percent: 1 %
Brady Statistic AS VP Percent: 91 %
Brady Statistic AS VS Percent: 1 %
Brady Statistic RA Percent Paced: 8.9 %
Date Time Interrogation Session: 20160613060006
HighPow Impedance: 77 Ohm
HighPow Impedance: 77 Ohm
Lead Channel Impedance Value: 1200 Ohm
Lead Channel Impedance Value: 440 Ohm
Lead Channel Impedance Value: 450 Ohm
Lead Channel Pacing Threshold Amplitude: 0.75 V
Lead Channel Pacing Threshold Amplitude: 0.75 V
Lead Channel Pacing Threshold Amplitude: 1.25 V
Lead Channel Pacing Threshold Pulse Width: 0.5 ms
Lead Channel Pacing Threshold Pulse Width: 0.5 ms
Lead Channel Pacing Threshold Pulse Width: 1 ms
Lead Channel Sensing Intrinsic Amplitude: 11.9 mV
Lead Channel Sensing Intrinsic Amplitude: 4 mV
Lead Channel Setting Pacing Amplitude: 2 V
Lead Channel Setting Pacing Amplitude: 2 V
Lead Channel Setting Pacing Amplitude: 2.5 V
Lead Channel Setting Pacing Pulse Width: 0.5 ms
Lead Channel Setting Pacing Pulse Width: 1 ms
Lead Channel Setting Sensing Sensitivity: 0.5 mV
Pulse Gen Serial Number: 7097007
Zone Setting Detection Interval: 250 ms
Zone Setting Detection Interval: 300 ms

## 2014-10-17 NOTE — Progress Notes (Signed)
Remote ICD transmission.   

## 2014-10-18 ENCOUNTER — Encounter: Payer: Self-pay | Admitting: Family Medicine

## 2014-10-18 ENCOUNTER — Ambulatory Visit (INDEPENDENT_AMBULATORY_CARE_PROVIDER_SITE_OTHER): Payer: Medicare Other | Admitting: Family Medicine

## 2014-10-18 VITALS — BP 136/78 | HR 71 | Temp 97.7°F | Ht 73.0 in | Wt 191.5 lb

## 2014-10-18 DIAGNOSIS — E119 Type 2 diabetes mellitus without complications: Secondary | ICD-10-CM

## 2014-10-18 DIAGNOSIS — Z1211 Encounter for screening for malignant neoplasm of colon: Secondary | ICD-10-CM

## 2014-10-18 DIAGNOSIS — Z7189 Other specified counseling: Secondary | ICD-10-CM

## 2014-10-18 DIAGNOSIS — Z0000 Encounter for general adult medical examination without abnormal findings: Secondary | ICD-10-CM | POA: Diagnosis not present

## 2014-10-18 DIAGNOSIS — Z23 Encounter for immunization: Secondary | ICD-10-CM

## 2014-10-18 DIAGNOSIS — I255 Ischemic cardiomyopathy: Secondary | ICD-10-CM | POA: Diagnosis not present

## 2014-10-18 DIAGNOSIS — E785 Hyperlipidemia, unspecified: Secondary | ICD-10-CM

## 2014-10-18 DIAGNOSIS — I10 Essential (primary) hypertension: Secondary | ICD-10-CM | POA: Diagnosis not present

## 2014-10-18 DIAGNOSIS — E1151 Type 2 diabetes mellitus with diabetic peripheral angiopathy without gangrene: Secondary | ICD-10-CM

## 2014-10-18 DIAGNOSIS — H6980 Other specified disorders of Eustachian tube, unspecified ear: Secondary | ICD-10-CM

## 2014-10-18 DIAGNOSIS — E1159 Type 2 diabetes mellitus with other circulatory complications: Secondary | ICD-10-CM

## 2014-10-18 NOTE — Progress Notes (Signed)
Pre visit review using our clinic review tool, if applicable. No additional management support is needed unless otherwise documented below in the visit note.  I have personally reviewed the Medicare Annual Wellness questionnaire and have noted 1. The patient's medical and social history 2. Their use of alcohol, tobacco or illicit drugs 3. Their current medications and supplements 4. The patient's functional ability including ADL's, fall risks, home safety risks and hearing or visual             impairment. 5. Diet and physical activities 6. Evidence for depression or mood disorders  The patients weight, height, BMI have been recorded in the chart and visual acuity is per eye clinic.  I have made referrals, counseling and provided education to the patient based review of the above and I have provided the pt with a written personalized care plan for preventive services.  Provider list updated- see scanned forms.  Routine anticipatory guidance given to patient.  See health maintenance.  Flu 2015 Shingles d/w pt.  PNA 2013 Tetanus 2012 Colonoscopy 2012, referred.  Prostate cancer screening and PSA options (with potential risks and benefits of testing vs not testing) were discussed along with recent recs/guidelines.  He declined testing PSA at this point. Advance directive- wife designated if patient were incapacitated.   Cognitive function addressed- see scanned forms- and if abnormal then additional documentation follows.   R ear feels plugged.  No fevers.  No L ear sx.    Diabetes:  Using medications without difficulties:yes Hypoglycemic episodes:no Hyperglycemic episodes:no Feet problems: at baseline, with dec in DP pulses noted prev Blood Sugars averaging: improved now, were much higher when sick with bronchitis, now ~100 in AM or lower.    Hypertension:    Using medication without problems or lightheadedness: yes Chest pain with exertion:no Edema:no Short of breath:no Average  home GYI:[id number] on home checks.   Elevated Cholesterol: Using medications without problems:yes Muscle aches: no Diet compliance:yes Exercise:yes  Ext sx improved after prev surgery, gradually improving.    PMH and SH reviewed  Meds, vitals, and allergies reviewed.   ROS: See HPI.  Otherwise negative.    GEN: nad, alert and oriented HEENT: mucous membranes moist NECK: supple w/o LA CV: rrr. PULM: ctab, no inc wob ABD: soft, +bs EXT: no edema SKIN: no acute rash  Diabetic foot exam: Normal inspection No skin breakdown No calluses  Dec DP pulses Dec sensation to light touch and monofilament Nails thickened

## 2014-10-18 NOTE — Patient Instructions (Addendum)
Check with your insurance to see if they will cover the shingles shot. Luke [name] will call about your referral (GI). Recheck A1c in about 3 months, before a visit.   Take care. Glad to see you.  Don't change your meds for now.

## 2014-10-19 DIAGNOSIS — Z1211 Encounter for screening for malignant neoplasm of colon: Secondary | ICD-10-CM | POA: Diagnosis not present

## 2014-10-19 DIAGNOSIS — E119 Type 2 diabetes mellitus without complications: Secondary | ICD-10-CM | POA: Diagnosis not present

## 2014-10-19 DIAGNOSIS — R140 Abdominal distension (gaseous): Secondary | ICD-10-CM | POA: Diagnosis not present

## 2014-10-20 DIAGNOSIS — Z0000 Encounter for general adult medical examination without abnormal findings: Secondary | ICD-10-CM | POA: Insufficient documentation

## 2014-10-20 DIAGNOSIS — H6980 Other specified disorders of Eustachian tube, unspecified ear: Secondary | ICD-10-CM | POA: Insufficient documentation

## 2014-10-20 DIAGNOSIS — Z7189 Other specified counseling: Secondary | ICD-10-CM | POA: Insufficient documentation

## 2014-10-20 NOTE — Assessment & Plan Note (Signed)
Flu 2015 Shingles d/w pt.  PNA 2013 Tetanus 2012 Colonoscopy 2012, referred.  Prostate cancer screening and PSA options (with potential risks and benefits of testing vs not testing) were discussed along with recent recs/guidelines.  He declined testing PSA at this point. Advance directive- wife designated if patient were incapacitated.   Cognitive function addressed- see scanned forms- and if abnormal then additional documentation follows.

## 2014-10-20 NOTE — Assessment & Plan Note (Signed)
Recheck A1c in about 3 months, before a visit.  Uncontrolled but I would expect his A1c to improve with current meds/diet/exercise.  He agrees.

## 2014-10-20 NOTE — Assessment & Plan Note (Signed)
TG still up, he'll continue work on diet and exercise, continue statin.  He agrees.

## 2014-10-20 NOTE — Assessment & Plan Note (Signed)
Check TMs wnl, likely just ETD.  No cerumen impaction.  D/w pt.  F/u prn.

## 2014-10-20 NOTE — Assessment & Plan Note (Signed)
Controlled, usually higher at MD visit.  Continue as is.  He agrees.

## 2014-10-24 DIAGNOSIS — I10 Essential (primary) hypertension: Secondary | ICD-10-CM | POA: Diagnosis not present

## 2014-10-24 DIAGNOSIS — M4712 Other spondylosis with myelopathy, cervical region: Secondary | ICD-10-CM | POA: Diagnosis not present

## 2014-10-26 ENCOUNTER — Encounter: Payer: Self-pay | Admitting: Cardiology

## 2014-11-02 ENCOUNTER — Encounter: Payer: Self-pay | Admitting: Internal Medicine

## 2014-11-21 ENCOUNTER — Other Ambulatory Visit: Payer: Self-pay

## 2014-11-21 MED ORDER — LISINOPRIL 20 MG PO TABS: 20.0000 mg | ORAL_TABLET | Freq: Two times a day (BID) | ORAL | Status: DC

## 2014-11-25 ENCOUNTER — Telehealth: Payer: Self-pay | Admitting: Cardiology

## 2014-11-25 NOTE — Telephone Encounter (Signed)
[place] and gave a verbal order for pt's Medication Lisinopril 20 mg, disp: 180 with 1 refill. Refill had been sent to [facility] instead. [facility] pharmacy confirmed the order.

## 2015-01-11 DIAGNOSIS — E11321 Type 2 diabetes mellitus with mild nonproliferative diabetic retinopathy with macular edema: Secondary | ICD-10-CM | POA: Diagnosis not present

## 2015-01-13 ENCOUNTER — Other Ambulatory Visit (INDEPENDENT_AMBULATORY_CARE_PROVIDER_SITE_OTHER): Payer: Medicare Other

## 2015-01-13 DIAGNOSIS — E119 Type 2 diabetes mellitus without complications: Secondary | ICD-10-CM | POA: Diagnosis not present

## 2015-01-13 LAB — HM DIABETES EYE EXAM

## 2015-01-13 LAB — HEMOGLOBIN A1C: Hgb A1c MFr Bld: 7.9 % — ABNORMAL HIGH (ref 4.6–6.5)

## 2015-01-16 ENCOUNTER — Ambulatory Visit (INDEPENDENT_AMBULATORY_CARE_PROVIDER_SITE_OTHER): Payer: Medicare Other | Admitting: *Deleted

## 2015-01-16 ENCOUNTER — Encounter: Payer: Self-pay | Admitting: Internal Medicine

## 2015-01-16 ENCOUNTER — Telehealth: Payer: Self-pay | Admitting: Cardiology

## 2015-01-16 DIAGNOSIS — I255 Ischemic cardiomyopathy: Secondary | ICD-10-CM

## 2015-01-16 DIAGNOSIS — I5022 Chronic systolic (congestive) heart failure: Secondary | ICD-10-CM | POA: Diagnosis not present

## 2015-01-16 NOTE — Progress Notes (Signed)
Remote ICD transmission.   

## 2015-01-16 NOTE — Telephone Encounter (Signed)
Spoke with pt and reminded pt of remote transmission that is due today. Pt verbalized understanding.   

## 2015-01-17 ENCOUNTER — Encounter: Payer: Self-pay | Admitting: Family

## 2015-01-18 ENCOUNTER — Ambulatory Visit (HOSPITAL_COMMUNITY)
Admission: RE | Admit: 2015-01-18 | Discharge: 2015-01-18 | Disposition: A | Discharge status: Home / Self Care | Payer: Medicare Other | Source: Ambulatory Visit | Attending: Family | Admitting: Family

## 2015-01-18 ENCOUNTER — Ambulatory Visit (INDEPENDENT_AMBULATORY_CARE_PROVIDER_SITE_OTHER)
Admission: RE | Admit: 2015-01-18 | Discharge: 2015-01-18 | Disposition: A | Discharge status: Home / Self Care | Payer: Medicare Other | Source: Ambulatory Visit | Attending: Family | Admitting: Family

## 2015-01-18 ENCOUNTER — Encounter: Payer: Self-pay | Admitting: Family

## 2015-01-18 ENCOUNTER — Ambulatory Visit (INDEPENDENT_AMBULATORY_CARE_PROVIDER_SITE_OTHER): Payer: Medicare Other | Admitting: Family

## 2015-01-18 VITALS — BP 172/84 | HR 63 | Temp 97.1°F | Resp 18 | Ht 75.0 in | Wt 192.0 lb

## 2015-01-18 DIAGNOSIS — I714 Abdominal aortic aneurysm, without rupture, unspecified: Secondary | ICD-10-CM

## 2015-01-18 DIAGNOSIS — E785 Hyperlipidemia, unspecified: Secondary | ICD-10-CM | POA: Insufficient documentation

## 2015-01-18 DIAGNOSIS — E119 Type 2 diabetes mellitus without complications: Secondary | ICD-10-CM | POA: Insufficient documentation

## 2015-01-18 DIAGNOSIS — Z87891 Personal history of nicotine dependence: Secondary | ICD-10-CM

## 2015-01-18 DIAGNOSIS — Z95828 Presence of other vascular implants and grafts: Secondary | ICD-10-CM

## 2015-01-18 DIAGNOSIS — Z48812 Encounter for surgical aftercare following surgery on the circulatory system: Secondary | ICD-10-CM | POA: Diagnosis not present

## 2015-01-18 DIAGNOSIS — I10 Essential (primary) hypertension: Secondary | ICD-10-CM | POA: Insufficient documentation

## 2015-01-18 DIAGNOSIS — I779 Disorder of arteries and arterioles, unspecified: Secondary | ICD-10-CM

## 2015-01-18 NOTE — Patient Instructions (Signed)

## 2015-01-18 NOTE — Progress Notes (Signed)
[facility] [place]  Established EVAR  History of Present Illness  Luke [name] is a 69 y.o. (Sep 17, 1945) male patient of Dr. Scot Dock who is s/p EVAR of a 6 cm infrarenal abdominal aortic aneurysm on 12/03/2011 and returns for routine follow up.  Pt states his blood pressure is usually 120's/70's, he checks it at home.He states that his blood pressure is always high in a medical facility.  He had c-spine surgery in February 2016, he has obtained considerable relief of his symptoms.  He denies back pain from this or any other source, also denies abdominal pain. He exercises at a gym at least 5 days/week and walks a great deal.  He takes a daily statin, ASA, and a beta blocker. He denies any history of stroke or TIA. He did have a silent MI, now has a pacemaker and defibrillator. He has peripheral neuropathy. He no longer has claudication with walking, denies non healing wounds.   Pt Diabetic: Yes, states in good control Pt smoker: former smoker, quit in 2006, smoked for 50 years   Past Medical History  Diagnosis Date  . Diabetes mellitus     type II  . PAD (peripheral artery disease)   . AAA (abdominal aortic aneurysm) 2011    Per vascular surgery  . HLD (hyperlipidemia)   . Hypertension     white coat HTN-- often elevated in office and controlled on outside checks.  . Low testosterone     Hx of  . Cardiomyopathy     Nuclear, October 09, 2011, EF 30%, multiple focal wall motion abnormalities  . Ejection fraction < 50%     EF 30%, nuclear, October 09, 2011  . LBBB (left bundle branch block)     LBBB on EKG October 11, 2011,  no prior EKG has been done  . CAD (coronary artery disease)     Presumed CAD with nuclear scan October 09, 2011,  large anteroseptal MI and inferior MI. Catheterization scheduled October 15, 2011  . Anxiety     no med. in use for anxiety but pt. speaks openly of his stress & anxious feelings regarding impending surgery    . ICD (implantable  cardioverter-defibrillator) in place     CRT-D placed March, 2014 complete heart block and k dysfunction  . Ejection fraction < 50%     EF previously 30%  //   EF 30-35%, echo, July 14, 2012, severe diffuse hypokinesis, PA pressure 43 mm mercury  . Pacemaker   . Myocardial infarction   . Drug therapy     Hyperkalemia with spironolactone  . Automatic implantable cardioverter-defibrillator in situ   . Fall due to ice or snow Feb.  19, 2015  . Atrial fibrillation    Past Surgical History  Procedure Laterality Date  . Tonsillectomy      as a child   . Colonoscopy    . Cardiac catheterization    . Abdominal aortic aneurysm repair      EVAR   . Pacemaker insertion  07-16-12    pacemaker/defibrilator  . Umbilical hernia repair N/A 05/14/2013    Procedure: LAPAROSCOPIC exploration and repair of hernia in abdominal ;  Surgeon: Adin Hector, MD;  Location: [facility];  Service: General;  Laterality: N/A;  . Insertion of mesh N/A 05/14/2013    Procedure: INSERTION OF MESH;  Surgeon: Adin Hector, MD;  Location: [place];  Service: General;  Laterality: N/A;  . Hernia repair  Jan. 9, 2015  .  Abdominal aortagram N/A 10/28/2011    Procedure: ABDOMINAL [name];  Surgeon: Angelia Mould, MD;  Location: [facility] CATH LAB;  Service: Cardiovascular;  Laterality: N/A;  . Lower extremity angiogram Bilateral 10/28/2011    Procedure: LOWER EXTREMITY ANGIOGRAM;  Surgeon: Angelia Mould, MD;  Location: [facility] CATH LAB;  Service: Cardiovascular;  Laterality: Bilateral;  . Bi-ventricular implantable cardioverter defibrillator N/A 07/16/2012    Procedure: BI-VENTRICULAR IMPLANTABLE CARDIOVERTER DEFIBRILLATOR  (CRT-D);  Surgeon: Deboraha Sprang, MD;  Location: [facility] C[facility] LAB;  Service: Cardiovascular;  Laterality: N/A;  . Posterior cervical fusion/foraminotomy N/A 06/09/2014    Procedure: Laminectomy - Cervical two-Cervcial four posterior cervical instrumented fusion Cervical two-cervical four;  Surgeon: Eustace Moore,  MD;  Location: [facility] NEURO ORS;  Service: Neurosurgery;  Laterality: N/A;  posterior    Social History Social History  Substance Use Topics  . Smoking status: Former Smoker -- 2.00 packs/day for 40 years    Types: Cigarettes    Quit date: 05/06/2004  . Smokeless tobacco: Never Used     Comment: quit about 7 years ago  . Alcohol Use: 1.2 oz/week    2 Cans of beer per week     Comment: beer or wine  occassionally   Family History Family History  Problem Relation Age of Onset  . Hypertension Mother   . Stroke Mother   . Hyperlipidemia Mother   . Diabetes Sister   . Heart disease Sister     Before age 64  . Hypertension Sister   . Hyperlipidemia Sister   . Heart attack Sister   . Colon cancer Neg Hx   . Prostate cancer Neg Hx   . Cancer Father     Lung  . Hypertension Son    Current Outpatient Prescriptions on File Prior to Visit  Medication Sig Dispense Refill  . amLODipine (NORVASC) 2.5 MG tablet Take 1 tablet (2.5 mg total) by mouth daily. 90 tablet 3  . aspirin EC 81 MG tablet Take 81 mg by mouth daily.    . carvedilol (COREG) 25 MG tablet Take 1 tablet (25 mg total) by mouth 2 (two) times daily with a meal. 60 tablet 9  . glipiZIDE (GLUCOTROL) 5 MG tablet 2 tabs in AM and 1 tab in PM. 270 tablet 3  . glucose blood (ACCU-CHEK AVIVA PLUS) test strip Use to check blood sugar three times a day, 2 hours after meals. DX: E11.51 300 each 1  . lisinopril (PRINIVIL,ZESTRIL) 20 MG tablet Take 1 tablet (20 mg total) by mouth 2 (two) times daily. 180 tablet 1  . metFORMIN (GLUCOPHAGE) 500 MG tablet take 2 tablets by mouth twice a day with food 360 tablet 1  . Multiple Vitamin (MULTIVITAMIN) tablet Take 1 tablet by mouth daily.     . sildenafil (REVATIO) 20 MG tablet TAKE THREE TO FOUR TABLETS BY MOUTH DAILY AS NEEDED (Patient taking differently: Take 60-80 mg by mouth daily as needed. TAKE THREE TO FOUR TABLETS BY MOUTH DAILY AS NEEDED, for neuropathy in feet.Marland Kitchen)    . simvastatin (ZOCOR)  40 MG tablet take 1 tablet by mouth at bedtime 90 tablet 3  . vitamin B-12 (CYANOCOBALAMIN) 500 MCG tablet Take 500 mcg by mouth daily.     No current facility-administered medications on file prior to visit.   Allergies  Allergen Reactions  . Aldactone [Spironolactone] Other (See Comments)    Hyperkalemia  . Codeine Rash     ROS: See HPI for pertinent positives and negatives.  Physical  Examination  Filed Vitals:   01/18/15 0917 01/18/15 0920  BP: 180/91 172/84  Pulse: 64 63  Temp: 97.1 F (36.2 C)   Resp: 18   Height: 6\' 3"  (1.905 m)   Weight: 192 lb (87.091 kg)   SpO2: 99%    Body mass index is 24 kg/(m^2).  General: A&O x 3, WD, tall male.  Pulmonary: Sym exp, good air movt, CTAB, no rales, rhonchi, or wheezing.   Cardiac: RRR, Nl S1, S2, no murmur appreciated, left side chest pacemaker/defibrillaor palpated subcutaneously.  Vascular: Vessel Right Left  Radial 2+Palpable 2+Palpable  Carotid without bruit without bruit  Aorta Not palpable N/A  Femoral 2+Palpable 2+Palpable  Popliteal Not palpable Not palpable  PT Not Palpable 2+Palpable  DP Not Palpable Not Palpable   Gastrointestinal: soft, NTND, -G/R, - HSM, - palpable masses, - CVAT B.  Musculoskeletal: M/S 5/5 throughout, Extremities without ischemic changes.  Neurologic: CN 2-12 intact, Motor exam as listed above.        CTA Abd/Pelvis Duplex (Date: 07/01/12) Mild atheromatous plaque and nonocclusive eccentric mural thrombus in the visualized distal descending thoracic and suprarenal abdominal aorta. Patent bifurcated infrarenal aortic stent graft without endoleak. Native aneurysm sac diameter 5.5 x 6.4 cm, previously 5.9 x 6.6     Non-Invasive Vascular Imaging  EVAR Duplex (Date: 01/18/2015) ABDOMINAL AORTA DUPLEX EVALUATION - POST ENDOVASCULAR REPAIR    INDICATION: Evaluation of endovascular abdominal repair of aortic aneurysm.    PREVIOUS INTERVENTION(S):  EVAR     DUPLEX EXAM:      DIAMETER AP (cm) DIAMETER TRANSVERSE (cm) VELOCITIES (cm/sec)  Aorta 3.79 3.93 60  Right Common Iliac 0.95  71  Left Common Iliac 0.89  91    Comparison Study       Date DIAMETER AP (cm) DIAMETER TRANSVERSE (cm)  01/05/2014 4.1 4.3     ADDITIONAL FINDINGS:     IMPRESSION: Patent endovascular abdominal aortic repair with a maximum diameter of 3.79 x 3.93 cm.    Compared to the previous exam:  Slight decrease in the maximum diameter when compared to the previous exam.     ABI (Date: 01/18/2015)  R: 0.59 (previous possibly in older paper records), DP: monophasic, PT: monophasic, TBI: 0.55  L: 1.06, DP: biphasic, PT: triphasic, TBI: 0.77    Medical Decision Making  Luke [name] is a 69 y.o. male who presents s/p EVAR (Date:  12/03/2011).  Pt is asymptomatic with a decrease in sac size from 4.3 cm to 3.93 cm in a year. PAOD - He no longer has claudication symptoms with walking. ABI's today indicate moderate arterial occlusive disease of the right LE with monophasic waveforms and normal in the left LE with tri and biphasic waveforms.  I discussed with the patient the importance of surveillance of the endograft.  The next endograft duplex will be scheduled for 12 months.  The patient will follow up with [place] in 12 months with these studies.  I emphasized the importance of maximal medical management including strict control of blood pressure, blood glucose, and lipid levels, antiplatelet agents, obtaining regular exercise, and cessation of smoking.   Thank you for allowing [place] to participate in this patient's care.  Clemon Chambers, RN, MSN, FNP-C [facility] [place] Office: [phone]  Clinic Physician: Scot Dock  01/18/2015, 9:51 AM

## 2015-01-19 ENCOUNTER — Encounter: Payer: Self-pay | Admitting: Family Medicine

## 2015-01-19 ENCOUNTER — Ambulatory Visit (INDEPENDENT_AMBULATORY_CARE_PROVIDER_SITE_OTHER): Payer: Medicare Other | Admitting: Family Medicine

## 2015-01-19 VITALS — BP 152/72 | HR 78 | Temp 98.2°F | Wt 193.0 lb

## 2015-01-19 DIAGNOSIS — Z119 Encounter for screening for infectious and parasitic diseases, unspecified: Secondary | ICD-10-CM | POA: Diagnosis not present

## 2015-01-19 DIAGNOSIS — E1159 Type 2 diabetes mellitus with other circulatory complications: Secondary | ICD-10-CM

## 2015-01-19 DIAGNOSIS — I779 Disorder of arteries and arterioles, unspecified: Secondary | ICD-10-CM

## 2015-01-19 DIAGNOSIS — E119 Type 2 diabetes mellitus without complications: Secondary | ICD-10-CM

## 2015-01-19 DIAGNOSIS — E1151 Type 2 diabetes mellitus with diabetic peripheral angiopathy without gangrene: Secondary | ICD-10-CM

## 2015-01-19 DIAGNOSIS — Z23 Encounter for immunization: Secondary | ICD-10-CM

## 2015-01-19 NOTE — Patient Instructions (Signed)
Recheck in about 4 months.  Labs ahead of a visit.   Take care.  Keep exercising.  Glad to see you.  Thanks for your effort.

## 2015-01-19 NOTE — Progress Notes (Signed)
Pre visit review using our clinic review tool, if applicable. No additional management support is needed unless otherwise documented below in the visit note.  His neck ROM is good and his hands are way better after the C spine procedure.  His hand feeling is much better.    He has vascular f/u yesterday, with a good report.  He had been exercising (and specifically walking) more, about 1 hour a day of cardio.    Diabetes:  Using medications without difficulties:yes Hypoglycemic episodes:rare, none in the last few weeks.  D/w pt about cautions.   Hyperglycemic episodes:no, clearly improved Feet problems: some tingling in feet from fall prev, but not from DM2.  This is some better since his procedure.  Blood Sugars averaging: 70-120 at night, and this is better recently.  His A1c likely lags his progress.   eye exam within last year: done last week.   Stable retinopathy per patient.   A1c slightly better, but not fully to goal.  D/w pt.    Meds, vitals, and allergies reviewed.   ROS: See HPI.  Otherwise negative.    GEN: nad, alert and oriented HEENT: mucous membranes moist NECK: supple w/o LA CV: rrr. PULM: ctab, no inc wob ABD: soft, +bs EXT: no edema SKIN: no acute rash  Diabetic foot exam: Normal inspection No skin breakdown No calluses  Palpable DP pulses B Normal sensation to light touch and monofilament Nails thickened

## 2015-01-20 NOTE — Assessment & Plan Note (Signed)
Improved, not fully to goal. Continue as is as he may have some A1c lag with recent improvement in home sugars.  He agrees.  Recheck in about 4 months.  Flu shot today.  Pt opts in for HCV screening with next set of labs.  D/w pt re: routine screening.

## 2015-01-20 NOTE — Addendum Note (Signed)
Addended by: Dorthula Rue L on: 01/20/2015 03:44 PM   Modules accepted: Orders

## 2015-02-15 ENCOUNTER — Encounter: Payer: Self-pay | Admitting: Cardiology

## 2015-02-17 LAB — CUP PACEART REMOTE DEVICE CHECK
Battery Remaining Longevity: 58 mo
Battery Remaining Percentage: 67 %
Battery Voltage: 2.96 V
Brady Statistic AP VP Percent: 13 %
Brady Statistic AP VS Percent: 1 %
Brady Statistic AS VP Percent: 87 %
Brady Statistic AS VS Percent: 1 %
Brady Statistic RA Percent Paced: 13 %
Date Time Interrogation Session: 20160912060514
HighPow Impedance: 77 Ohm
HighPow Impedance: 77 Ohm
Implantable Lead Implant Date: 20140313
Implantable Lead Implant Date: 20140313
Implantable Lead Implant Date: 20140313
Implantable Lead Location: 753858
Implantable Lead Location: 753859
Implantable Lead Location: 753860
Lead Channel Impedance Value: 1175 Ohm
Lead Channel Impedance Value: 430 Ohm
Lead Channel Impedance Value: 440 Ohm
Lead Channel Pacing Threshold Amplitude: 0.75 V
Lead Channel Pacing Threshold Amplitude: 0.875 V
Lead Channel Pacing Threshold Amplitude: 1.25 V
Lead Channel Pacing Threshold Pulse Width: 0.5 ms
Lead Channel Pacing Threshold Pulse Width: 0.5 ms
Lead Channel Pacing Threshold Pulse Width: 1 ms
Lead Channel Sensing Intrinsic Amplitude: 11.9 mV
Lead Channel Sensing Intrinsic Amplitude: 3.6 mV
Lead Channel Setting Pacing Amplitude: 2 V
Lead Channel Setting Pacing Amplitude: 2 V
Lead Channel Setting Pacing Amplitude: 2.5 V
Lead Channel Setting Pacing Pulse Width: 0.5 ms
Lead Channel Setting Pacing Pulse Width: 1 ms
Lead Channel Setting Sensing Sensitivity: 0.5 mV
Pulse Gen Serial Number: 7097007

## 2015-03-01 ENCOUNTER — Other Ambulatory Visit: Payer: Self-pay | Admitting: Family Medicine

## 2015-05-02 ENCOUNTER — Encounter: Payer: Self-pay | Admitting: *Deleted

## 2015-05-17 ENCOUNTER — Other Ambulatory Visit (INDEPENDENT_AMBULATORY_CARE_PROVIDER_SITE_OTHER): Payer: Medicare Other

## 2015-05-17 ENCOUNTER — Other Ambulatory Visit: Payer: Self-pay | Admitting: Family Medicine

## 2015-05-17 DIAGNOSIS — E119 Type 2 diabetes mellitus without complications: Secondary | ICD-10-CM

## 2015-05-17 DIAGNOSIS — Z119 Encounter for screening for infectious and parasitic diseases, unspecified: Secondary | ICD-10-CM

## 2015-05-17 LAB — COMPREHENSIVE METABOLIC PANEL
ALT: 19 U/L (ref 0–53)
AST: 17 U/L (ref 0–37)
Albumin: 4.4 g/dL (ref 3.5–5.2)
Alkaline Phosphatase: 64 U/L (ref 39–117)
BUN: 28 mg/dL — ABNORMAL HIGH (ref 6–23)
CO2: 29 mEq/L (ref 19–32)
Calcium: 9.9 mg/dL (ref 8.4–10.5)
Chloride: 103 mEq/L (ref 96–112)
Creatinine, Ser: 1.3 mg/dL (ref 0.40–1.50)
GFR: 58.24 mL/min — ABNORMAL LOW (ref >60.00)
Glucose, Bld: 241 mg/dL — ABNORMAL HIGH (ref 70–99)
Potassium: 4.9 mEq/L (ref 3.5–5.1)
Sodium: 138 mEq/L (ref 135–145)
Total Bilirubin: 0.5 mg/dL (ref 0.2–1.2)
Total Protein: 6.9 g/dL (ref 6.0–8.3)

## 2015-05-17 LAB — HEMOGLOBIN A1C: Hgb A1c MFr Bld: 7.9 % — ABNORMAL HIGH (ref 4.6–6.5)

## 2015-05-18 LAB — HEPATITIS C ANTIBODY: HCV Ab: NEGATIVE

## 2015-05-22 ENCOUNTER — Ambulatory Visit (INDEPENDENT_AMBULATORY_CARE_PROVIDER_SITE_OTHER): Payer: Medicare Other | Admitting: Family Medicine

## 2015-05-22 ENCOUNTER — Encounter: Payer: Self-pay | Admitting: Family Medicine

## 2015-05-22 VITALS — BP 140/80 | HR 71 | Temp 97.9°F | Wt 188.0 lb

## 2015-05-22 DIAGNOSIS — E1159 Type 2 diabetes mellitus with other circulatory complications: Secondary | ICD-10-CM | POA: Diagnosis not present

## 2015-05-22 DIAGNOSIS — H9319 Tinnitus, unspecified ear: Secondary | ICD-10-CM

## 2015-05-22 NOTE — Progress Notes (Signed)
Pre visit review using our clinic review tool, if applicable. No additional management support is needed unless otherwise documented below in the visit note.  Diabetes:  Using medications without difficulties:yes Hypoglycemic episodes:no Hyperglycemic episodes:no Feet problems: still at baseline, no change from baseline after his fall (still with hand and foot sensation changes) Blood Sugars averaging: usually higher in the later AM, then better as the day goes on- that is his typical pattern.  ~100 early AM (~5AM), ~180 later in the AM eye exam within last year: yes Still working on diet and exercise.   A1c d/w pt.  Goal A1c lower, d/w pt but at this point he isn't interested in extra meds.  This is reasonable.  I don't want to induce hypoglycemia. He agrees.   Still with ETD on the R TM. Tinnitus noted.  Asking about options.   Meds, vitals, and allergies reviewed.   ROS: See HPI.  Otherwise negative.    GEN: nad, alert and oriented HEENT: mucous membranes moist, tm wnl, no erythema.   NECK: supple w/o LA CV: rrr. PULM: ctab, no inc wob ABD: soft, +bs  Diabetic foot exam: Normal inspection No skin breakdown No calluses  Decreased but palpable DP pulses B Dec sensation to light touch and monofilament Nails normal

## 2015-05-22 NOTE — Patient Instructions (Signed)
Recheck A1c in about 3 months before a visit.  Take care.  Glad to see you.  Keep working on your diet and weight.  Rosaria Ferries or Ebony Hail will call about your referral to ENT.

## 2015-05-23 DIAGNOSIS — H9319 Tinnitus, unspecified ear: Secondary | ICD-10-CM | POA: Insufficient documentation

## 2015-05-23 NOTE — Assessment & Plan Note (Signed)
A1c d/w pt.  Goal A1c lower, d/w pt but at this point he isn't interested in extra meds.  This is reasonable.  I don't want to induce hypoglycemia. He agrees.  Continue D&E.  Recheck A1c in about 3 months before a visit.  He agrees.

## 2015-05-23 NOTE — Assessment & Plan Note (Signed)
Refer

## 2015-05-25 ENCOUNTER — Ambulatory Visit (INDEPENDENT_AMBULATORY_CARE_PROVIDER_SITE_OTHER): Payer: Medicare Other | Admitting: Cardiology

## 2015-05-25 ENCOUNTER — Encounter: Payer: Self-pay | Admitting: Cardiology

## 2015-05-25 VITALS — BP 150/90 | HR 68 | Ht 75.0 in | Wt 193.4 lb

## 2015-05-25 DIAGNOSIS — I255 Ischemic cardiomyopathy: Secondary | ICD-10-CM | POA: Diagnosis not present

## 2015-05-25 DIAGNOSIS — I2510 Atherosclerotic heart disease of native coronary artery without angina pectoris: Secondary | ICD-10-CM | POA: Diagnosis not present

## 2015-05-25 DIAGNOSIS — Z4502 Encounter for adjustment and management of automatic implantable cardiac defibrillator: Secondary | ICD-10-CM | POA: Diagnosis not present

## 2015-05-25 DIAGNOSIS — Z8679 Personal history of other diseases of the circulatory system: Secondary | ICD-10-CM

## 2015-05-25 DIAGNOSIS — I739 Peripheral vascular disease, unspecified: Secondary | ICD-10-CM

## 2015-05-25 DIAGNOSIS — I442 Atrioventricular block, complete: Secondary | ICD-10-CM

## 2015-05-25 DIAGNOSIS — Z9889 Other specified postprocedural states: Secondary | ICD-10-CM

## 2015-05-25 DIAGNOSIS — I2583 Coronary atherosclerosis due to lipid rich plaque: Secondary | ICD-10-CM

## 2015-05-25 NOTE — Patient Instructions (Signed)

## 2015-05-25 NOTE — Progress Notes (Signed)
Cardiology Office Note    Date:  05/25/2015   ID:  Luke [name], DOB 1946/01/10, MRN [id number]  PCP:  Elsie Stain, MD  Cardiologist:   Candee Furbish, MD (former [name])    History of Present Illness:  Luke [name] is a 70 y.o. male Luke [name]) who presents today to follow-up coronary disease and cardiomyopathy. EF 35%.   Prior cervical disc surgery. This has been done and he is feeling much better. He still has numbness in his hands which will hopefully slowly improved. Overall his activity level is much better. He is really remarkably active. He is on appropriate medications for his cardiomyopathy. He has no signs of ongoing significant CHF. He has no chest pain.  [place] 2013 - 2V CAD Left anterior descending (LAD): focal 70-80% proximal. Long 40% mid vessel. Right coronary artery (RCA): Diffusely diseased. 50-60% mid vessel, 70% distal, 70% at bifurcation of PDA/PLOM.  LV Ejection Fraction: 30%. He underwent catheterization which confirmed severe LV dysfunction with moderate to severe 2 vessel disease not thought to be appropriate for intervention.   Overall he is been doing very well, exercises regularly at the [employer], daily treadmill for miles. No anginal symptoms. He is not experiencing any significant claudication. Peripheral vascular disease is being followed at vascular surgery office. He underwent a cervical neck surgery and is improved as stated above.   Past Medical History  Diagnosis Date  . Diabetes mellitus     type II  . PAD (peripheral artery disease) ([place])   . AAA (abdominal aortic aneurysm) ([place]) 2011    Per vascular surgery  . HLD (hyperlipidemia)   . Hypertension     white coat HTN-- often elevated in office and controlled on outside checks.  . Low testosterone     Hx of  . Cardiomyopathy [facility])     Nuclear, October 09, 2011, EF 30%, multiple focal wall motion abnormalities  . Ejection fraction < 50%     EF 30%, nuclear, October 09, 2011  . LBBB (left  bundle branch block)     LBBB on EKG October 11, 2011,  no prior EKG has been done  . CAD (coronary artery disease)     Presumed CAD with nuclear scan October 09, 2011,  large anteroseptal MI and inferior MI. Catheterization scheduled October 15, 2011  . Anxiety     no med. in use for anxiety but pt. speaks openly of his stress & anxious feelings regarding impending surgery    . ICD (implantable cardioverter-defibrillator) in place     CRT-D placed March, 2014 complete heart block and k dysfunction  . Ejection fraction < 50%     EF previously 30%  //   EF 30-35%, echo, July 14, 2012, severe diffuse hypokinesis, PA pressure 43 mm mercury  . Pacemaker   . Myocardial infarction ([place])   . Drug therapy     Hyperkalemia with spironolactone  . Automatic implantable cardioverter-defibrillator in situ   . Fall due to ice or snow Feb.  19, 2015  . Atrial fibrillation [facility])     Past Surgical History  Procedure Laterality Date  . Tonsillectomy      as a child   . Colonoscopy    . Cardiac catheterization    . Abdominal aortic aneurysm repair      EVAR   . Pacemaker insertion  07-16-12    pacemaker/defibrilator  . Umbilical hernia repair N/A 05/14/2013    Procedure: LAPAROSCOPIC exploration and repair of hernia  in abdominal ;  Surgeon: Adin Hector, MD;  Location: [place];  Service: General;  Laterality: N/A;  . Insertion of mesh N/A 05/14/2013    Procedure: INSERTION OF MESH;  Surgeon: Adin Hector, MD;  Location: [place];  Service: General;  Laterality: N/A;  . Hernia repair  Jan. 9, 2015  . Abdominal aortagram N/A 10/28/2011    Procedure: ABDOMINAL [name];  Surgeon: Angelia Mould, MD;  Location: [facility] CATH LAB;  Service: Cardiovascular;  Laterality: N/A;  . Lower extremity angiogram Bilateral 10/28/2011    Procedure: LOWER EXTREMITY ANGIOGRAM;  Surgeon: Angelia Mould, MD;  Location: [facility] CATH LAB;  Service: Cardiovascular;  Laterality: Bilateral;  . Bi-ventricular implantable cardioverter  defibrillator N/A 07/16/2012    Procedure: BI-VENTRICULAR IMPLANTABLE CARDIOVERTER DEFIBRILLATOR  (CRT-D);  Surgeon: Deboraha Sprang, MD;  Location: [facility] CATH LAB;  Service: Cardiovascular;  Laterality: N/A;  . Posterior cervical fusion/foraminotomy N/A 06/09/2014    Procedure: Laminectomy - Cervical two-Cervcial four posterior cervical instrumented fusion Cervical two-cervical four;  Surgeon: Eustace Moore, MD;  Location: [place] NEURO ORS;  Service: Neurosurgery;  Laterality: N/A;  posterior     Outpatient Prescriptions Prior to Visit  Medication Sig Dispense Refill  . amLODipine (NORVASC) 2.5 MG tablet Take 1 tablet (2.5 mg total) by mouth daily. 90 tablet 3  . aspirin EC 81 MG tablet Take 81 mg by mouth daily.    . carvedilol (COREG) 25 MG tablet Take 1 tablet (25 mg total) by mouth 2 (two) times daily with a meal. 60 tablet 9  . glucose blood (ACCU-CHEK AVIVA PLUS) test strip Use to check blood sugar three times a day, 2 hours after meals. DX: E11.51 300 each 1  . lisinopril (PRINIVIL,ZESTRIL) 20 MG tablet Take 1 tablet (20 mg total) by mouth 2 (two) times daily. 180 tablet 1  . metFORMIN (GLUCOPHAGE) 500 MG tablet take 2 tablets by mouth twice a day with food 360 tablet 1  . Multiple Vitamin (MULTIVITAMIN) tablet Take 1 tablet by mouth daily.     . Probiotic Product (PROBIOTIC & ACIDOPHILUS EX ST PO) Take 1 capsule by mouth daily.     . simvastatin (ZOCOR) 40 MG tablet take 1 tablet by mouth at bedtime 90 tablet 3  . vitamin B-12 (CYANOCOBALAMIN) 500 MCG tablet Take 500 mcg by mouth daily.    Marland Kitchen glipiZIDE (GLUCOTROL) 5 MG tablet 2 tabs in AM and 1 tab in PM. 270 tablet 3  . sildenafil (REVATIO) 20 MG tablet TAKE THREE TO FOUR TABLETS BY MOUTH DAILY AS NEEDED (Patient taking differently: Take 60-80 mg by mouth daily as needed. TAKE THREE TO FOUR TABLETS BY MOUTH DAILY AS NEEDED, for neuropathy in feet.Marland Kitchen)     No facility-administered medications prior to visit.     Allergies:   Aldactone and Codeine    Social History   Social History  . Marital Status: Married    Spouse Name: N/A  . Number of Children: N/A  . Years of Education: N/A   Social History Main Topics  . Smoking status: Former Smoker -- 2.00 packs/day for 40 years    Types: Cigarettes    Quit date: 05/06/2004  . Smokeless tobacco: Never Used     Comment: quit about 7 years ago  . Alcohol Use: 1.2 oz/week    2 Cans of beer per week     Comment: beer or wine  occassionally  . Drug Use: No  . Sexual Activity: Not Asked  Other Topics Concern  . None   Social History Narrative   [place] vet   retired     Family History:  The patient's family history includes Cancer in his father; Diabetes in his sister; Heart attack in his sister; Heart disease in his sister; Hyperlipidemia in his mother and sister; Hypertension in his mother, sister, and son; Stroke in his mother. There is no history of Colon cancer or Prostate cancer.   ROS:   Please see the history of present illness.    ROS All other systems reviewed and are negative.   PHYSICAL EXAM:   VS:  BP 150/90 mmHg  Pulse 68  Ht 6\' 3"  (1.905 m)  Wt 193 lb 6.4 oz (87.726 kg)  BMI 24.17 kg/m2   GEN: Well nourished, well developed, in no acute distress HEENT: normal Neck: no JVD, carotid bruits, or masses Cardiac: RRR; no murmurs, rubs, or gallops,no edema ICD noted Respiratory:  clear to auscultation bilaterally, normal work of breathing GI: soft, nontender, nondistended, + BS, protuberant abdomen MS: no deformity or atrophy Skin: warm and dry, no rash, right leg barely palpable pulses, left leg palpable pulses Neuro:  Alert and Oriented x 3, Strength and sensation are intact Psych: euthymic mood, full affect  Wt Readings from Last 3 Encounters:  05/25/15 193 lb 6.4 oz (87.726 kg)  05/22/15 188 lb (85.276 kg)  01/19/15 193 lb (87.544 kg)      Studies/Labs Reviewed:   EKG:  EKG is ordered today.  The ekg ordered today demonstrates 05/25/15-sinus  rhythm, 66, biventricular pacing spikes noted heart rate 66 bpm. Her smoking reviewed.  Recent Labs: 05/31/2014: Hemoglobin 14.2; Platelets 224 05/17/2015: ALT 19; BUN 28*; Creatinine, Ser 1.30; Potassium 4.9; Sodium 138   Lipid Panel    Component Value Date/Time   CHOL 166 10/12/2014 0759   TRIG 309.0* 10/12/2014 0759   HDL 32.90* 10/12/2014 0759   CHOLHDL 5 10/12/2014 0759   VLDL 61.8* 10/12/2014 0759   LDLCALC 53 09/13/2013 0847   LDLDIRECT 70.0 10/12/2014 0759    Additional studies/ records that were reviewed today include:  Prior office notes, lab, echocardiogram, cardiac catheterization, lab work reviewed    ASSESSMENT:    1. Complete heart block ([place])   2. Coronary artery disease due to lipid rich plaque   3. Encounter for fitting or adjustment of automatic implantable cardioverter-defibrillator   4. Ischemic cardiomyopathy   5. PVD (peripheral vascular disease) ([place])   6. Status post abdominal aortic aneurysm repair      PLAN:  In order of problems listed above:  1. Underwent CRT/ICD [place] implantation for intermittent complete heart block in the setting of ischemic cardiomyopathy and left bundle branch block. 2. Stable without any exertional symptoms, disease on cardiac catheterization reviewed. We did discuss once again why he did not undergo revascularization. 3. Per Dr. Caryl Comes. Prior note reviewed. 4. Left leg distal pulses palpable, challenging to feel pulses on right leg. This correlates with his ABIs. He is not expressing any significant claudication during exercise. He is able to walk on the treadmill for 4 miles a day. He goes to [employer]. 5. On beta blocker as well as ACE inhibitor, at goal dosing. Stable.    Medication Adjustments/Labs and Tests Ordered: Current medicines are reviewed at length with the patient today.  Concerns regarding medicines are outlined above.  Medication changes, Labs and Tests ordered today are listed in the Patient  Instructions below. There are no Patient Instructions on file for  this visit.     Bobby Rumpf, MD  05/25/2015 3:57 PM    [facility] [address], [place], [place]  [address] Phone: [phone]; Fax: [phone]

## 2015-05-26 DIAGNOSIS — H9113 Presbycusis, bilateral: Secondary | ICD-10-CM | POA: Diagnosis not present

## 2015-05-26 DIAGNOSIS — H9313 Tinnitus, bilateral: Secondary | ICD-10-CM | POA: Diagnosis not present

## 2015-05-29 ENCOUNTER — Telehealth: Payer: Self-pay | Admitting: *Deleted

## 2015-05-29 NOTE — Telephone Encounter (Signed)
Received fax from [place], [place], [facility] [facility] for PA on Sildenafil.  Submitted thru [facility], awaiting response.

## 2015-05-30 ENCOUNTER — Other Ambulatory Visit: Payer: Self-pay | Admitting: *Deleted

## 2015-05-30 MED ORDER — SIMVASTATIN 40 MG PO TABS: 40.0000 mg | ORAL_TABLET | Freq: Every day | ORAL | Status: DC

## 2015-05-30 MED ORDER — SILDENAFIL CITRATE 20 MG PO TABS: ORAL_TABLET | ORAL | Status: DC

## 2015-05-30 NOTE — Telephone Encounter (Signed)
Noted. Thanks.   Please notify pt.

## 2015-05-30 NOTE — Telephone Encounter (Signed)
Patient advised and had already been notified from his insurance.  Patient asks that the Rx be sent to [facility] at [facility] for "cash pay".

## 2015-05-30 NOTE — Telephone Encounter (Signed)
Scott with [facility] Medicare left v/m with denial of sildenafil; is excluded drug on pts plan. Letter of denial will follow. Any questions can call provider line.

## 2015-05-30 NOTE — Addendum Note (Signed)
Addended by: Josetta Huddle on: 05/30/2015 02:55 PM   Modules accepted: Orders

## 2015-06-01 ENCOUNTER — Other Ambulatory Visit: Payer: Self-pay | Admitting: *Deleted

## 2015-06-01 MED ORDER — GLIPIZIDE 5 MG PO TABS: ORAL_TABLET | ORAL | Status: DC

## 2015-06-27 ENCOUNTER — Ambulatory Visit (INDEPENDENT_AMBULATORY_CARE_PROVIDER_SITE_OTHER): Payer: Medicare Other | Admitting: Internal Medicine

## 2015-06-27 ENCOUNTER — Encounter: Payer: Self-pay | Admitting: Internal Medicine

## 2015-06-27 VITALS — BP 196/98 | HR 76 | Ht 75.0 in | Wt 196.2 lb

## 2015-06-27 DIAGNOSIS — R42 Dizziness and giddiness: Secondary | ICD-10-CM

## 2015-06-27 DIAGNOSIS — I255 Ischemic cardiomyopathy: Secondary | ICD-10-CM

## 2015-06-27 DIAGNOSIS — Z95810 Presence of automatic (implantable) cardiac defibrillator: Secondary | ICD-10-CM | POA: Diagnosis not present

## 2015-06-27 DIAGNOSIS — I5022 Chronic systolic (congestive) heart failure: Secondary | ICD-10-CM | POA: Diagnosis not present

## 2015-06-27 NOTE — Progress Notes (Signed)
Patient Care Team: Tonia Ghent, MD as PCP - General Angelia Mould, MD as Attending Physician (Vascular Surgery) Carlena Bjornstad, MD (Cardiology) Arta Silence, MD as Consulting Physician (Gastroenterology) Michael Boston, MD as Consulting Physician (General Surgery) Eustace Moore, MD as Consulting Physician (Neurosurgery)   HPI  Luke [name] is a 70 y.o. male Seen in followup for an CRT-  ICD implanted for intermittent complete heart block in the setting of ischemic cardiomyopathy and left bundle branch block.  LHC [id number] CAD Left anterior descending (LAD): focal 70-80% proximal. Long 40% mid vessel. Right coronary artery (RCA): Diffusely diseased. 50-60% mid vessel, 70% distal, 70% at bifurcation of PDA/PLOM.   LV Ejection Fraction: 30%. He underwent catheterization which confirmed severe LV dysfunction with moderate to severe 2 vessel disease not thought to be appropriate for intervention.    He is much improved following CRT-D implantation.  The patient denies chest pain, shortness of breath, nocturnal dyspnea, orthopnea or peripheral edema.  There have been no palpitations, lightheadedness or syncope.   He has been intolerant of Aldactone in the past because of hyperkalemia  Past Medical History  Diagnosis Date  . Diabetes mellitus     type II  . PAD (peripheral artery disease) ([place])   . AAA (abdominal aortic aneurysm) ([place]) 2011    Per vascular surgery  . HLD (hyperlipidemia)   . Hypertension     white coat HTN-- often elevated in office and controlled on outside checks.  . Low testosterone     Hx of  . Cardiomyopathy [facility])     Nuclear, October 09, 2011, EF 30%, multiple focal wall motion abnormalities  . Ejection fraction < 50%     EF 30%, nuclear, October 09, 2011  . LBBB (left bundle branch block)     LBBB on EKG October 11, 2011,  no prior EKG has been done  . CAD (coronary artery disease)     Presumed CAD with nuclear scan October 09, 2011,  large  anteroseptal MI and inferior MI. Catheterization scheduled October 15, 2011  . Anxiety     no med. in use for anxiety but pt. speaks openly of his stress & anxious feelings regarding impending surgery    . ICD (implantable cardioverter-defibrillator) in place     CRT-D placed March, 2014 complete heart block and k dysfunction  . Ejection fraction < 50%     EF previously 30%  //   EF 30-35%, echo, July 14, 2012, severe diffuse hypokinesis, PA pressure 43 mm mercury  . Pacemaker   . Myocardial infarction ([place])   . Drug therapy     Hyperkalemia with spironolactone  . Automatic implantable cardioverter-defibrillator in situ   . Fall due to ice or snow Feb.  19, 2015  . Atrial fibrillation [facility])     Past Surgical History  Procedure Laterality Date  . Tonsillectomy      as a child   . Colonoscopy    . Cardiac catheterization    . Abdominal aortic aneurysm repair      EVAR   . Pacemaker insertion  07-16-12    pacemaker/defibrilator  . Umbilical hernia repair N/A 05/14/2013    Procedure: LAPAROSCOPIC exploration and repair of hernia in abdominal ;  Surgeon: Adin Hector, MD;  Location: [facility];  Service: General;  Laterality: N/A;  . Insertion of mesh N/A 05/14/2013    Procedure: INSERTION OF MESH;  Surgeon: Adin Hector, MD;  Location:  [facility];  Service: General;  Laterality: N/A;  . Hernia repair  Jan. 9, 2015  . Abdominal aortagram N/A 10/28/2011    Procedure: ABDOMINAL [name];  Surgeon: Angelia Mould, MD;  Location: [facility] CATH LAB;  Service: Cardiovascular;  Laterality: N/A;  . Lower extremity angiogram Bilateral 10/28/2011    Procedure: LOWER EXTREMITY ANGIOGRAM;  Surgeon: Angelia Mould, MD;  Location: [facility] CATH LAB;  Service: Cardiovascular;  Laterality: Bilateral;  . Bi-ventricular implantable cardioverter defibrillator N/A 07/16/2012    Procedure: BI-VENTRICULAR IMPLANTABLE CARDIOVERTER DEFIBRILLATOR  (CRT-D);  Surgeon: Deboraha Sprang, MD;  Location: [facility] CATH LAB;  Service:  Cardiovascular;  Laterality: N/A;  . Posterior cervical fusion/foraminotomy N/A 06/09/2014    Procedure: Laminectomy - Cervical two-Cervcial four posterior cervical instrumented fusion Cervical two-cervical four;  Surgeon: Eustace Moore, MD;  Location: [place] NEURO ORS;  Service: Neurosurgery;  Laterality: N/A;  posterior     Current Outpatient Prescriptions  Medication Sig Dispense Refill  . amLODipine (NORVASC) 2.5 MG tablet Take 1 tablet (2.5 mg total) by mouth daily. 90 tablet 3  . aspirin EC 81 MG tablet Take 81 mg by mouth daily.    . carvedilol (COREG) 25 MG tablet Take 1 tablet (25 mg total) by mouth 2 (two) times daily with a meal. 60 tablet 9  . glipiZIDE (GLUCOTROL) 5 MG tablet Take 2 tablets by mouth in the AM and 1 tablet by mouth in the PM 270 tablet 1  . glucose blood (ACCU-CHEK AVIVA PLUS) test strip Use to check blood sugar three times a day, 2 hours after meals. DX: E11.51 300 each 1  . lisinopril (PRINIVIL,ZESTRIL) 20 MG tablet Take 1 tablet (20 mg total) by mouth 2 (two) times daily. 180 tablet 1  . metFORMIN (GLUCOPHAGE) 500 MG tablet take 2 tablets by mouth twice a day with food 360 tablet 1  . Multiple Vitamin (MULTIVITAMIN) tablet Take 1 tablet by mouth daily.     . Probiotic Product (PROBIOTIC & ACIDOPHILUS EX ST PO) Take 1 capsule by mouth daily.     . sildenafil (REVATIO) 20 MG tablet Take 3-4 tablets by mouth as needed daily. 50 tablet 0  . simvastatin (ZOCOR) 40 MG tablet Take 1 tablet (40 mg total) by mouth at bedtime. 90 tablet 3  . vitamin B-12 (CYANOCOBALAMIN) 500 MCG tablet Take 500 mcg by mouth daily.     No current facility-administered medications for this visit.    Allergies  Allergen Reactions  . Aldactone [Spironolactone] Other (See Comments)    Hyperkalemia  . Codeine Rash    Review of Systems negative except from HPI and PMH  Physical Exam BP 196/98 mmHg  Pulse 76  Ht 6\' 3"  (1.905 m)  Wt 196 lb 3.2 oz (88.996 kg)  BMI 24.52 kg/m2 Well  developed and nourished in no acute distress HENT normal Neck supple with JVP-flat Clear Device pocket well healed; without hematoma or erythema.  There is no tethering there is atrophy Regular rate and rhythm, no murmurs or gallops Abd-soft with active BS No Clubbing cyanosis edema Skin-warm and dry A & Oriented  Grossly normal sensory and motor function   ECG demonstrates P-synchronous/ AV  pacing with CRT pacing with an up right QRS in lead V1 and a negative QRS in lead 1  Assessment and  Plan  Iscehmic Cardiomyopathy Without symptoms of ischemia  Chronic HF systolic  Euvolemic continue current meds  Implantable Defibrillator CRT  [employer] The patient's device was interrogated.  The information  was reviewed. Output was reprogrammed to improve safety threshold  Complete heart Block Blood pressure is site  Hypertension   Device Advisory - RECALL    The device was reprogrammed to improve his safety margin little bit. Otherwise he is quite well. He is euvolemic. He has no symptoms of ischemia.  His blood pressures out of sight but he says that when he checks it at home 130 range. He describes this to coat hypertension.  We discussed the advisory recall. He is device dependent but the reported incidence remains very small.

## 2015-06-27 NOTE — Patient Instructions (Signed)
Medication Instructions: - Your physician recommends that you continue on your current medications as directed. Please refer to the Current Medication list given to you today.  Labwork: - none  Procedures/Testing: - nond  Follow-Up: - Remote monitoring is used to monitor your Pacemaker of ICD from home. This monitoring reduces the number of office visits required to check your device to one time per year. It allows [place] to keep an eye on the functioning of your device to ensure it is working properly. You are scheduled for a device check from home on 09/26/15. You may send your transmission at any time that day. If you have a wireless device, the transmission will be sent automatically. After your physician reviews your transmission, you will receive a postcard with your next transmission date.  - Your physician wants you to follow-up in: 1 year with Chanetta Marshall, NP for Dr. Caryl Comes. You will receive a reminder letter in the mail two months in advance. If you don't receive a letter, please call our office to schedule the follow-up appointment.  Any Additional Special Instructions Will Be Listed Below (If Applicable).     If you need a refill on your cardiac medications before your next appointment, please call your pharmacy.

## 2015-06-28 LAB — CUP PACEART INCLINIC DEVICE CHECK
Battery Remaining Longevity: 55.2
Brady Statistic RA Percent Paced: 15 %
Brady Statistic RV Percent Paced: 99.98 %
Date Time Interrogation Session: 20170221203455
HighPow Impedance: 76.5 Ohm
Implantable Lead Implant Date: 20140313
Implantable Lead Implant Date: 20140313
Implantable Lead Implant Date: 20140313
Implantable Lead Location: 753858
Implantable Lead Location: 753859
Implantable Lead Location: 753860
Lead Channel Impedance Value: 1062.5 Ohm
Lead Channel Impedance Value: 450 Ohm
Lead Channel Impedance Value: 487.5 Ohm
Lead Channel Pacing Threshold Amplitude: 0.75 V
Lead Channel Pacing Threshold Amplitude: 0.75 V
Lead Channel Pacing Threshold Amplitude: 1 V
Lead Channel Pacing Threshold Amplitude: 1 V
Lead Channel Pacing Threshold Amplitude: 1.5 V
Lead Channel Pacing Threshold Amplitude: 1.5 V
Lead Channel Pacing Threshold Pulse Width: 0.5 ms
Lead Channel Pacing Threshold Pulse Width: 0.5 ms
Lead Channel Pacing Threshold Pulse Width: 0.5 ms
Lead Channel Pacing Threshold Pulse Width: 0.5 ms
Lead Channel Pacing Threshold Pulse Width: 0.6 ms
Lead Channel Pacing Threshold Pulse Width: 0.6 ms
Lead Channel Sensing Intrinsic Amplitude: 11.9 mV
Lead Channel Sensing Intrinsic Amplitude: 4.5 mV
Lead Channel Setting Pacing Amplitude: 2 V
Lead Channel Setting Pacing Amplitude: 2 V
Lead Channel Setting Pacing Amplitude: 2.5 V
Lead Channel Setting Pacing Pulse Width: 0.5 ms
Lead Channel Setting Pacing Pulse Width: 0.6 ms
Lead Channel Setting Sensing Sensitivity: 0.5 mV
Pulse Gen Serial Number: 7097007

## 2015-08-07 ENCOUNTER — Other Ambulatory Visit: Payer: Self-pay | Admitting: Family Medicine

## 2015-08-07 DIAGNOSIS — E1159 Type 2 diabetes mellitus with other circulatory complications: Secondary | ICD-10-CM

## 2015-08-14 ENCOUNTER — Other Ambulatory Visit (INDEPENDENT_AMBULATORY_CARE_PROVIDER_SITE_OTHER): Payer: Medicare Other

## 2015-08-14 DIAGNOSIS — E1159 Type 2 diabetes mellitus with other circulatory complications: Secondary | ICD-10-CM

## 2015-08-14 LAB — HEMOGLOBIN A1C: Hgb A1c MFr Bld: 8.3 % — ABNORMAL HIGH (ref 4.6–6.5)

## 2015-08-21 ENCOUNTER — Encounter: Payer: Self-pay | Admitting: Family Medicine

## 2015-08-21 ENCOUNTER — Other Ambulatory Visit: Payer: Self-pay | Admitting: *Deleted

## 2015-08-21 ENCOUNTER — Ambulatory Visit (INDEPENDENT_AMBULATORY_CARE_PROVIDER_SITE_OTHER): Payer: Medicare Other | Admitting: Family Medicine

## 2015-08-21 VITALS — BP 152/86 | HR 67 | Temp 98.4°F | Wt 190.8 lb

## 2015-08-21 DIAGNOSIS — I255 Ischemic cardiomyopathy: Secondary | ICD-10-CM | POA: Diagnosis not present

## 2015-08-21 DIAGNOSIS — E1159 Type 2 diabetes mellitus with other circulatory complications: Secondary | ICD-10-CM | POA: Diagnosis not present

## 2015-08-21 MED ORDER — SILDENAFIL CITRATE 20 MG PO TABS
ORAL_TABLET | ORAL | Status: DC
Start: 2015-08-21 — End: 2016-08-21

## 2015-08-21 MED ORDER — METFORMIN HCL 500 MG PO TABS: ORAL_TABLET | ORAL | Status: DC

## 2015-08-21 MED ORDER — AMLODIPINE BESYLATE 2.5 MG PO TABS: 2.5000 mg | ORAL_TABLET | Freq: Every day | ORAL | Status: DC

## 2015-08-21 NOTE — Patient Instructions (Signed)
Let me think about med options in the meantime.  Work on [employer] more, keep getting some exercise and recheck labs before a physical in about 3 months.  Take care.  Glad to see you.

## 2015-08-21 NOTE — Progress Notes (Signed)
Pre visit review using our clinic review tool, if applicable. No additional management support is needed unless otherwise documented below in the visit note.  BP at home was 128/69.  He has white coat elevations at the clinic.  This is a chronic issue for him.    Diabetes:  Using medications without difficulties: yes Hypoglycemic episodes: rare lows, once in the 70s every few months and diet dependent.  Hyperglycemic episodes: not unless diet was way off.   Feet problems: some dec in feeling, since he fell- still with some numbness in the hands since he fell.  Blood Sugars averaging: see below.  eye exam within last year: yes A1c up some.  D/w pt.  Usually higher in the AM after 6AM (up to 180s), lower before 6AM.  He likely has dawn effect.   He is still in the gym exercising frequently.  He is working on diet.   Meds, vitals, and allergies reviewed.   ROS: See HPI.  Otherwise negative.    GEN: nad, alert and oriented HEENT: mucous membranes moist NECK: supple w/o LA CV: rrr. PULM: ctab, no inc wob ABD: soft, +bs EXT: no edema SKIN: no acute rash  Diabetic foot exam: Normal inspection except for some dry skin No skin breakdown No calluses  1+ DP pulses Normal sensation to light touch and monofilament on exam today Nails normal

## 2015-08-22 NOTE — Assessment & Plan Note (Signed)
He expected his A1c to be better.  He wants to avoid insulin.  I want to consider his case and oral options and then we'll be in touch.  Likely he'll opt to work harder on diet along with exercise and recheck in about 3 months.  Still okay for outpatient f/u.

## 2015-08-24 ENCOUNTER — Telehealth: Payer: Self-pay | Admitting: Family Medicine

## 2015-08-24 NOTE — Telephone Encounter (Signed)
Please call pt.  I thought about his situation.   invokana is another other oral med that I think he could take for Dm2.  He can price check it if he wants to.  Have him consider it, no change in meds in the meantime, keep working on sugar with diet and exercise.  Update me as needed, esp if sugar continues to go up. Recheck as planned.   Thanks.

## 2015-08-24 NOTE — Telephone Encounter (Signed)
Patient advised.

## 2015-09-01 ENCOUNTER — Other Ambulatory Visit: Payer: Self-pay | Admitting: *Deleted

## 2015-09-01 MED ORDER — CARVEDILOL 25 MG PO TABS: 25.0000 mg | ORAL_TABLET | Freq: Two times a day (BID) | ORAL | Status: DC

## 2015-09-04 ENCOUNTER — Telehealth: Payer: Self-pay | Admitting: *Deleted

## 2015-09-04 NOTE — Telephone Encounter (Signed)
I'll work on the hard copy.  Thanks.  

## 2015-09-04 NOTE — Telephone Encounter (Signed)
Faxed form received for Aviva Plus Test Strips for Medicare Part B Detailed Written Order.  Form placed in Dr. Buckner [place] In [place].

## 2015-09-05 NOTE — Telephone Encounter (Signed)
Form re-faxed listing "test blood sugar once daily".

## 2015-09-05 NOTE — Telephone Encounter (Addendum)
[facility] left v/m that DWO form was received but needs to be refaxed with instructions on how to use test strips. Med list last instructions from 2015.Please advise.

## 2015-09-26 ENCOUNTER — Ambulatory Visit (INDEPENDENT_AMBULATORY_CARE_PROVIDER_SITE_OTHER): Payer: Medicare Other | Admitting: *Deleted

## 2015-09-26 ENCOUNTER — Telehealth: Payer: Self-pay | Admitting: Cardiology

## 2015-09-26 DIAGNOSIS — Z95810 Presence of automatic (implantable) cardiac defibrillator: Secondary | ICD-10-CM

## 2015-09-26 DIAGNOSIS — I255 Ischemic cardiomyopathy: Secondary | ICD-10-CM

## 2015-09-26 DIAGNOSIS — I5022 Chronic systolic (congestive) heart failure: Secondary | ICD-10-CM

## 2015-09-26 NOTE — Telephone Encounter (Signed)
LMOVM reminding pt to send remote transmission.   

## 2015-09-29 ENCOUNTER — Encounter: Payer: Self-pay | Admitting: Cardiology

## 2015-09-29 NOTE — Progress Notes (Signed)
Remote ICD transmission.   

## 2015-10-16 LAB — CUP PACEART REMOTE DEVICE CHECK
Battery Remaining Longevity: 53 mo
Battery Remaining Percentage: 59 %
Battery Voltage: 2.96 V
Brady Statistic AP VP Percent: 10 %
Brady Statistic AP VS Percent: 1 %
Brady Statistic AS VP Percent: 90 %
Brady Statistic AS VS Percent: 1 %
Brady Statistic RA Percent Paced: 10 %
Date Time Interrogation Session: 20170525054806
HighPow Impedance: 79 Ohm
HighPow Impedance: 79 Ohm
Implantable Lead Implant Date: 20140313
Implantable Lead Implant Date: 20140313
Implantable Lead Implant Date: 20140313
Implantable Lead Location: 753858
Implantable Lead Location: 753859
Implantable Lead Location: 753860
Lead Channel Impedance Value: 1075 Ohm
Lead Channel Impedance Value: 460 Ohm
Lead Channel Impedance Value: 540 Ohm
Lead Channel Pacing Threshold Amplitude: 0.625 V
Lead Channel Pacing Threshold Pulse Width: 0.5 ms
Lead Channel Sensing Intrinsic Amplitude: 11.9 mV
Lead Channel Sensing Intrinsic Amplitude: 4.6 mV
Lead Channel Setting Pacing Amplitude: 2 V
Lead Channel Setting Pacing Amplitude: 2 V
Lead Channel Setting Pacing Amplitude: 2.5 V
Lead Channel Setting Pacing Pulse Width: 0.5 ms
Lead Channel Setting Pacing Pulse Width: 0.6 ms
Lead Channel Setting Sensing Sensitivity: 0.5 mV
Pulse Gen Serial Number: 7097007

## 2015-10-24 ENCOUNTER — Encounter: Payer: Self-pay | Admitting: Cardiology

## 2015-11-12 ENCOUNTER — Other Ambulatory Visit: Payer: Self-pay | Admitting: Family Medicine

## 2015-11-12 DIAGNOSIS — E1159 Type 2 diabetes mellitus with other circulatory complications: Secondary | ICD-10-CM

## 2015-11-14 ENCOUNTER — Ambulatory Visit (INDEPENDENT_AMBULATORY_CARE_PROVIDER_SITE_OTHER): Payer: Medicare Other

## 2015-11-14 ENCOUNTER — Other Ambulatory Visit (INDEPENDENT_AMBULATORY_CARE_PROVIDER_SITE_OTHER): Payer: Medicare Other

## 2015-11-14 VITALS — BP 160/92 | HR 63 | Temp 98.1°F | Ht 72.75 in | Wt 185.5 lb

## 2015-11-14 DIAGNOSIS — Z0000 Encounter for general adult medical examination without abnormal findings: Secondary | ICD-10-CM | POA: Diagnosis not present

## 2015-11-14 DIAGNOSIS — E1159 Type 2 diabetes mellitus with other circulatory complications: Secondary | ICD-10-CM

## 2015-11-14 LAB — HEMOGLOBIN A1C: Hgb A1c MFr Bld: 8 % — ABNORMAL HIGH (ref 4.6–6.5)

## 2015-11-14 LAB — LDL CHOLESTEROL, DIRECT: Direct LDL: 91 mg/dL

## 2015-11-14 LAB — COMPREHENSIVE METABOLIC PANEL
ALT: 18 U/L (ref 0–53)
AST: 16 U/L (ref 0–37)
Albumin: 4.1 g/dL (ref 3.5–5.2)
Alkaline Phosphatase: 60 U/L (ref 39–117)
BUN: 27 mg/dL — ABNORMAL HIGH (ref 6–23)
CO2: 29 mEq/L (ref 19–32)
Calcium: 9.5 mg/dL (ref 8.4–10.5)
Chloride: 102 mEq/L (ref 96–112)
Creatinine, Ser: 1.31 mg/dL (ref 0.40–1.50)
GFR: 57.65 mL/min — ABNORMAL LOW (ref >60.00)
Glucose, Bld: 200 mg/dL — ABNORMAL HIGH (ref 70–99)
Potassium: 4.8 mEq/L (ref 3.5–5.1)
Sodium: 137 mEq/L (ref 135–145)
Total Bilirubin: 0.4 mg/dL (ref 0.2–1.2)
Total Protein: 7 g/dL (ref 6.0–8.3)

## 2015-11-14 LAB — LIPID PANEL
Cholesterol: 196 mg/dL (ref 0–200)
HDL: 29.3 mg/dL — ABNORMAL LOW (ref >39.00)
NonHDL: 166.3
Total CHOL/HDL Ratio: 7
Triglycerides: 381 mg/dL — ABNORMAL HIGH (ref 0.0–149.0)
VLDL: 76.2 mg/dL — ABNORMAL HIGH (ref 0.0–40.0)

## 2015-11-14 NOTE — Patient Instructions (Addendum)
Luke [name] , Thank you for taking time to come for your Medicare Wellness Visit. I appreciate your ongoing commitment to your health goals. Please review the following plan we discussed and let me know if I can assist you in the future.   These are the goals we discussed: Goals    . Increase physical activity     Starting 7/11/12017, I will continue to exercise for at least 1 hr 45 min 5 days per week.        This is a list of the screening recommended for you and due dates:  Health Maintenance  Topic Date Due  . Shingles Vaccine  11/21/2015  . Flu Shot  12/05/2015  . Eye exam for diabetics  01/13/2016  . Hemoglobin A1C  02/13/2016  . Complete foot exam   08/20/2016  . Colon Cancer Screening  08/22/2019  . Tetanus Vaccine  07/04/2020  .  Hepatitis C: One time screening is recommended by Center for Disease Control  (CDC) for  adults born from [birth year] through 1964.   Completed  . Pneumonia vaccines  Completed    Preventive Care for Adults  A healthy lifestyle and preventive care can promote health and wellness. Preventive health guidelines for adults include the following key practices.  . A routine yearly physical is a good way to check with your health care provider about your health and preventive screening. It is a chance to share any concerns and updates on your health and to receive a thorough exam.  . Visit your dentist for a routine exam and preventive care every 6 months. Brush your teeth twice a day and floss once a day. Good oral hygiene prevents tooth decay and gum disease.  . The frequency of eye exams is based on your age, health, family medical history, use  of contact lenses, and other factors. Follow your health care provider's ecommendations for frequency of eye exams.  . Eat a healthy diet. Foods like vegetables, fruits, whole grains, low-fat dairy products, and lean protein foods contain the nutrients you need without too many calories. Decrease your intake of  foods high in solid fats, added sugars, and salt. Eat the right amount of calories for you. Get information about a proper diet from your health care provider, if necessary.  . Regular physical exercise is one of the most important things you can do for your health. Most adults should get at least 150 minutes of moderate-intensity exercise (any activity that increases your heart rate and causes you to sweat) each week. In addition, most adults need muscle-strengthening exercises on 2 or more days a week.  Silver Sneakers may be a benefit available to you. To determine eligibility, you may visit the website: www.silversneakers.com or contact program at 712 623 1882 Mon-Fri between 8AM-8PM.   . Maintain a healthy weight. The body mass index (BMI) is a screening tool to identify possible weight problems. It provides an estimate of body fat based on height and weight. Your health care provider can find your BMI and can help you achieve or maintain a healthy weight.   For adults 20 years and older: ? A BMI below 18.5 is considered underweight. ? A BMI of 18.5 to 24.9 is normal. ? A BMI of 25 to 29.9 is considered overweight. ? A BMI of 30 and above is considered obese.   . Maintain normal blood lipids and cholesterol levels by exercising and minimizing your intake of saturated fat. Eat a balanced diet with plenty of fruit  and vegetables. Blood tests for lipids and cholesterol should begin at age 27 and be repeated every 5 years. If your lipid or cholesterol levels are high, you are over 50, or you are at high risk for heart disease, you may need your cholesterol levels checked more frequently. Ongoing high lipid and cholesterol levels should be treated with medicines if diet and exercise are not working.  . If you smoke, find out from your health care provider how to quit. If you do not use tobacco, please do not start.  . If you choose to drink alcohol, please do not consume more than 2 drinks per  day. One drink is considered to be 12 ounces (355 mL) of beer, 5 ounces (148 mL) of wine, or 1.5 ounces (44 mL) of liquor.  . If you are 40-69 years old, ask your health care provider if you should take aspirin to prevent strokes.  . Use sunscreen. Apply sunscreen liberally and repeatedly throughout the day. You should seek shade when your shadow is shorter than you. Protect yourself by wearing long sleeves, pants, a wide-brimmed hat, and sunglasses year round, whenever you are outdoors.  . Once a month, do a whole body skin exam, using a mirror to look at the skin on your back. Tell your health care provider of new moles, moles that have irregular borders, moles that are larger than a pencil eraser, or moles that have changed in shape or color.

## 2015-11-14 NOTE — Progress Notes (Signed)
Pre visit review using our clinic review tool, if applicable. No additional management support is needed unless otherwise documented below in the visit note. 

## 2015-11-14 NOTE — Progress Notes (Signed)
PCP notes  Health maintenance  Shingles - will discuss with PCP at CPE  Abnormal screenings:   Hearing - failed  Patient concerns: Pt wants to discuss Invokana with PCP.  Nurse concerns: Pt's BP was elevated. PCP notified.  Next PCP appt: 11/21/15 @ 1415  I reviewed health advisor's note, was available for consultation on the day of service listed in this note, and agree with documentation and plan. Elsie Stain, MD.

## 2015-11-14 NOTE — Progress Notes (Signed)
Subjective:   Luke [name] is a 70 y.o. male who presents for Medicare Annual/Subsequent preventive examination.  Review of Systems:  N/A Cardiac Risk Factors include: advanced age (>59men, >67 women);male gender;diabetes mellitus;dyslipidemia;hypertension     Objective:    Vitals: BP 160/92 mmHg  Pulse 63  Temp(Src) 98.1 F (36.7 C) (Oral)  Ht 6' 0.75" (1.848 m)  Wt 185 lb 8 oz (84.142 kg)  BMI 24.64 kg/m2  SpO2 97%  Body mass index is 24.64 kg/(m^2).  Tobacco History  Smoking status  . Former Smoker -- 2.00 packs/day for 40 years  . Types: Cigarettes  . Quit date: 05/06/2004  Smokeless tobacco  . Never Used    Comment: quit about 7 years ago     Counseling given: No   Past Medical History  Diagnosis Date  . Diabetes mellitus     type II  . PAD (peripheral artery disease) ([place])   . AAA (abdominal aortic aneurysm) ([place]) 2011    Per vascular surgery  . HLD (hyperlipidemia)   . Hypertension     white coat HTN-- often elevated in office and controlled on outside checks.  . Low testosterone     Hx of  . Cardiomyopathy [facility])     Nuclear, October 09, 2011, EF 30%, multiple focal wall motion abnormalities  . Ejection fraction < 50%     EF 30%, nuclear, October 09, 2011  . LBBB (left bundle branch block)     LBBB on EKG October 11, 2011,  no prior EKG has been done  . CAD (coronary artery disease)     Presumed CAD with nuclear scan October 09, 2011,  large anteroseptal MI and inferior MI. Catheterization scheduled October 15, 2011  . Anxiety     no med. in use for anxiety but pt. speaks openly of his stress & anxious feelings regarding impending surgery    . ICD (implantable cardioverter-defibrillator) in place     CRT-D placed March, 2014 complete heart block and k dysfunction  . Ejection fraction < 50%     EF previously 30%  //   EF 30-35%, echo, July 14, 2012, severe diffuse hypokinesis, PA pressure 43 mm mercury  . Pacemaker   . Myocardial infarction ([place])   . Drug  therapy     Hyperkalemia with spironolactone  . Automatic implantable cardioverter-defibrillator in situ   . Fall due to ice or snow Feb.  19, 2015  . Atrial fibrillation [facility])    Past Surgical History  Procedure Laterality Date  . Tonsillectomy      as a child   . Colonoscopy    . Cardiac catheterization    . Abdominal aortic aneurysm repair      EVAR   . Pacemaker insertion  07-16-12    pacemaker/defibrilator  . Umbilical hernia repair N/A 05/14/2013    Procedure: LAPAROSCOPIC exploration and repair of hernia in abdominal ;  Surgeon: Adin Hector, MD;  Location: [place];  Service: General;  Laterality: N/A;  . Insertion of mesh N/A 05/14/2013    Procedure: INSERTION OF MESH;  Surgeon: Adin Hector, MD;  Location: [place];  Service: General;  Laterality: N/A;  . Hernia repair  Jan. 9, 2015  . Abdominal aortagram N/A 10/28/2011    Procedure: ABDOMINAL [name];  Surgeon: Angelia Mould, MD;  Location: [facility] CATH LAB;  Service: Cardiovascular;  Laterality: N/A;  . Lower extremity angiogram Bilateral 10/28/2011    Procedure: LOWER EXTREMITY ANGIOGRAM;  Surgeon: Harrell Gave  Nicole Cella, MD;  Location: [facility] CATH LAB;  Service: Cardiovascular;  Laterality: Bilateral;  . Bi-ventricular implantable cardioverter defibrillator N/A 07/16/2012    Procedure: BI-VENTRICULAR IMPLANTABLE CARDIOVERTER DEFIBRILLATOR  (CRT-D);  Surgeon: Deboraha Sprang, MD;  Location: [facility] CATH LAB;  Service: Cardiovascular;  Laterality: N/A;  . Posterior cervical fusion/foraminotomy N/A 06/09/2014    Procedure: Laminectomy - Cervical two-Cervcial four posterior cervical instrumented fusion Cervical two-cervical four;  Surgeon: Eustace Moore, MD;  Location: [place] NEURO ORS;  Service: Neurosurgery;  Laterality: N/A;  posterior    Family History  Problem Relation Age of Onset  . Hypertension Mother   . Stroke Mother   . Hyperlipidemia Mother   . Diabetes Sister   . Heart disease Sister     Before age 17  . Hypertension Sister    . Hyperlipidemia Sister   . Heart attack Sister   . Colon cancer Neg Hx   . Prostate cancer Neg Hx   . Cancer Father     Lung  . Hypertension Son    History  Sexual Activity  . Sexual Activity: Not on file    Outpatient Encounter Prescriptions as of 11/14/2015  Medication Sig  . amLODipine (NORVASC) 2.5 MG tablet Take 1 tablet (2.5 mg total) by mouth daily.  Marland Kitchen aspirin EC 81 MG tablet Take 81 mg by mouth daily.  . carvedilol (COREG) 25 MG tablet Take 1 tablet (25 mg total) by mouth 2 (two) times daily with a meal.  . glipiZIDE (GLUCOTROL) 5 MG tablet Take 2 tablets by mouth in the AM and 1 tablet by mouth in the PM  . glucose blood (ACCU-CHEK AVIVA PLUS) test strip Use to check blood sugar three times a day, 2 hours after meals. DX: E11.51  . lisinopril (PRINIVIL,ZESTRIL) 20 MG tablet Take 1 tablet (20 mg total) by mouth 2 (two) times daily.  . metFORMIN (GLUCOPHAGE) 500 MG tablet take 2 tablets by mouth twice a day with food  . Multiple Vitamin (MULTIVITAMIN) tablet Take 1 tablet by mouth daily.   . Probiotic Product (PROBIOTIC & ACIDOPHILUS EX ST PO) Take 1 capsule by mouth daily.   . sildenafil (REVATIO) 20 MG tablet Take 3-4 tablets by mouth as needed daily.  . simvastatin (ZOCOR) 40 MG tablet Take 1 tablet (40 mg total) by mouth at bedtime.  . vitamin B-12 (CYANOCOBALAMIN) 500 MCG tablet Take 500 mcg by mouth daily.   No facility-administered encounter medications on file as of 11/14/2015.    Activities of Daily Living In your present state of health, do you have any difficulty performing the following activities: 11/14/2015  Hearing? N  Vision? N  Difficulty concentrating or making decisions? N  Walking or climbing stairs? N  Dressing or bathing? N  Doing errands, shopping? N  Preparing Food and eating ? N  Using the Toilet? N  In the past six months, have you accidently leaked urine? N  Do you have problems with loss of bowel control? N  Managing your Medications? N    Managing your Finances? N  Housekeeping or managing your Housekeeping? N    Patient Care Team: Tonia Ghent, MD as PCP - General Angelia Mould, MD as Attending Physician (Vascular Surgery) Carlena Bjornstad, MD (Cardiology) Arta Silence, MD as Consulting Physician (Gastroenterology) Michael Boston, MD as Consulting Physician (General Surgery) Eustace Moore, MD as Consulting Physician (Neurosurgery) Juluis Rainier as Referring Physician (Optometry)   Assessment:     Hearing Screening  125Hz  250Hz  500Hz  1000Hz  2000Hz  4000Hz  8000Hz   Right ear:   0 0 40 40   Left ear:   0 0 40 0   Vision Screening Comments: Last vision exam with Dr. Idolina Primer with April 2017   Exercise Activities and Dietary recommendations Current Exercise Habits: Structured exercise class, Type of exercise: strength training/weights;Other - see comments (cardio), Time (Minutes): > 60, Frequency (Times/Week): 5, Weekly Exercise (Minutes/Week): 0, Intensity: Moderate  Goals    . Increase physical activity     Starting 7/11/12017, I will continue to exercise for at least 1 hr 45 min 5 days per week.       Fall Risk Fall Risk  11/14/2015 10/18/2014 03/18/2013  Falls in the past year? No No No   Depression Screen PHQ 2/9 Scores 11/14/2015 10/18/2014 03/18/2013  PHQ - 2 Score 0 0 0    Cognitive Testing MMSE - Mini Mental State Exam 11/14/2015  Orientation to time 5  Orientation to Place 5  Registration 3  Attention/ Calculation 0  Recall 3  Language- name 2 objects 0  Language- repeat 1  Language- follow 3 step command 3  Language- read & follow direction 0  Write a sentence 0  Copy design 0  Total score 20   PLEASE NOTE: A Mini-Cog screen was completed. Maximum score is 20. A value of 0 denotes this part of [place] MMSE was not completed or the patient failed this part of the Mini-Cog screening.   Mini-Cog Screening Orientation to Time - Max 5 pts Orientation to Place - Max 5 pts Registration - Max  3 pts Recall - Max 3 pts Language Repeat - Max 1 pts Language Follow 3 Step Command - Max 3 pts  Immunization History  Administered Date(s) Administered  . Influenza Split 01/31/2011, 02/11/2012  . Influenza Whole 02/27/2010  . Influenza,inj,Quad PF,36+ Mos 03/18/2013, 03/25/2014, 01/19/2015  . Pneumococcal Conjugate-13 10/18/2014  . Pneumococcal Polysaccharide-23 11/14/2011  . Td 07/05/2010   Screening Tests Health Maintenance  Topic Date Due  . ZOSTAVAX  11/21/2015 (Originally 10/09/2006)  . INFLUENZA VACCINE  12/05/2015  . OPHTHALMOLOGY EXAM  01/13/2016  . HEMOGLOBIN A1C  02/13/2016  . FOOT EXAM  08/20/2016  . COLONOSCOPY  08/22/2019  . TETANUS/TDAP  07/04/2020  . Hepatitis C Screening  Completed  . PNA vac Low Risk Adult  Completed      Plan:     I have personally reviewed and addressed the Medicare Annual Wellness questionnaire and have noted the following in the patient's chart:  A. Medical and social history B. Use of alcohol, tobacco or illicit drugs  C. Current medications and supplements D. Functional ability and status E.  Nutritional status F.  Physical activity G. Advance directives H. List of other physicians I.  Hospitalizations, surgeries, and ER visits in previous 12 months J.  [place] to include hearing, vision, cognitive, depression L. Referrals and appointments - none  In addition, I have reviewed and discussed with patient certain preventive protocols, quality metrics, and best practice recommendations. A written personalized care plan for preventive services as well as general preventive health recommendations were provided to patient.  See attached scanned questionnaire for additional information.   Signed,   Lindell Noe, MHA, BS, LPN Health Advisor

## 2015-11-21 ENCOUNTER — Ambulatory Visit (INDEPENDENT_AMBULATORY_CARE_PROVIDER_SITE_OTHER): Payer: Medicare Other | Admitting: Family Medicine

## 2015-11-21 ENCOUNTER — Encounter: Payer: Self-pay | Admitting: Family Medicine

## 2015-11-21 VITALS — BP 128/70 | HR 63 | Temp 97.9°F | Ht 75.0 in | Wt 177.5 lb

## 2015-11-21 DIAGNOSIS — S14129S Central cord syndrome at unspecified level of cervical spinal cord, sequela: Secondary | ICD-10-CM | POA: Diagnosis not present

## 2015-11-21 DIAGNOSIS — I255 Ischemic cardiomyopathy: Secondary | ICD-10-CM | POA: Diagnosis not present

## 2015-11-21 DIAGNOSIS — E785 Hyperlipidemia, unspecified: Secondary | ICD-10-CM | POA: Diagnosis not present

## 2015-11-21 DIAGNOSIS — I10 Essential (primary) hypertension: Secondary | ICD-10-CM | POA: Diagnosis not present

## 2015-11-21 DIAGNOSIS — E1159 Type 2 diabetes mellitus with other circulatory complications: Secondary | ICD-10-CM

## 2015-11-21 MED ORDER — GLIPIZIDE 5 MG PO TABS: 10.0000 mg | ORAL_TABLET | Freq: Two times a day (BID) | ORAL | Status: DC

## 2015-11-21 MED ORDER — LISINOPRIL 20 MG PO TABS: 20.0000 mg | ORAL_TABLET | Freq: Two times a day (BID) | ORAL | Status: DC

## 2015-11-21 MED ORDER — GLUCOSE BLOOD VI STRP: ORAL_STRIP | Status: DC

## 2015-11-21 MED ORDER — METFORMIN HCL 500 MG PO TABS: ORAL_TABLET | ORAL | Status: DC

## 2015-11-21 NOTE — Progress Notes (Signed)
Pre visit review using our clinic review tool, if applicable. No additional management support is needed unless otherwise documented below in the visit note.  Hearing - failed at [facility] visit.  Declined hearing aids- had hearing eval done out of clinic.  D/w pt at OV.   Diabetes:  Using medications without difficulties:yes Hypoglycemic episodes:no Hyperglycemic episodes:no Feet problems: some tingling in the R>L foot.  Not burning.  Not painful.   Blood Sugars averaging:usually up in the AM, if checked after 6AM.  Lower later in the day eye exam within last year: yes He wanted to talk about invokana, with cautions d/w pt.   A1c d/w pt.   Diet had been off previous, but he has started back on a stricter diet.  D/w pt.    Shingles shot discussed. He wanted to get at next shot.    His arm strength is better in the meantime, L hand is farther along recovery compared to R hand.  Doing plenty of cardio at the gym.  He does have some dec in ROM in the neck at baseline.  No claudication.   Hypertension:    Using medication without problems or lightheadedness: yes Chest pain with exertion:no Edema:no Short of breath:no  Elevated Cholesterol: Using medications without problems:yes Muscle aches: no Diet compliance:yes Exercise:yes  PMH and SH reviewed.   Vital signs, Meds and allergies reviewed.  ROS: Per HPI unless specifically indicated in ROS section   GEN: nad, alert and oriented HEENT: mucous membranes moist NECK: supple w/o LA CV: rrr.  no murmur PULM: ctab, no inc wob ABD: soft, +bs EXT: no edema SKIN: no acute rash  Diabetic foot exam: Normal inspection No skin breakdown No calluses  DP pulses palpable B Normal sensation to light tough and monofilament Nails normal

## 2015-11-21 NOTE — Patient Instructions (Addendum)
Recheck A1c in about 3 months before a visit.  I would increase the glipizide to 2 tabs twice a day.  If you have lows then cut back to 2 in the AM and 1 at dinner.  Keep working out and working on [employer].  Take care.  Glad to see you.

## 2015-11-22 NOTE — Assessment & Plan Note (Signed)
Controlled, continue as is.  He agrees.

## 2015-11-22 NOTE — Assessment & Plan Note (Signed)
His arm strength is better in the meantime, L hand is farther along recovery compared to R hand.  Doing plenty of cardio at the gym.  He does have some dec in ROM in the neck at baseline.  No claudication. Will follow clinically.  No new/worsening sx.

## 2015-11-22 NOTE — Assessment & Plan Note (Signed)
He wanted to talk about invokana, with cautions d/w pt.   A1c d/w pt.   Diet had been off previous, but he has started back on a stricter diet.  D/w pt.   Recheck A1c in about 3 months before a visit.  He'll increase the glipizide to 2 tabs twice a day.  If having lows then cut back to 2 in the AM and 1 at dinner.  He agrees.

## 2015-11-22 NOTE — Assessment & Plan Note (Signed)
LDL and TG up along with A1c, he'll work more on sugar and diet/exercise.  D/w pt.  He agrees.

## 2015-12-01 ENCOUNTER — Ambulatory Visit (INDEPENDENT_AMBULATORY_CARE_PROVIDER_SITE_OTHER): Payer: Medicare Other | Admitting: Cardiology

## 2015-12-01 ENCOUNTER — Encounter: Payer: Self-pay | Admitting: Cardiology

## 2015-12-01 VITALS — BP 130/100 | HR 76 | Ht 75.0 in | Wt 189.8 lb

## 2015-12-01 DIAGNOSIS — I255 Ischemic cardiomyopathy: Secondary | ICD-10-CM | POA: Diagnosis not present

## 2015-12-01 DIAGNOSIS — I5022 Chronic systolic (congestive) heart failure: Secondary | ICD-10-CM

## 2015-12-01 DIAGNOSIS — Z95810 Presence of automatic (implantable) cardiac defibrillator: Secondary | ICD-10-CM

## 2015-12-01 DIAGNOSIS — I739 Peripheral vascular disease, unspecified: Secondary | ICD-10-CM

## 2015-12-01 NOTE — Progress Notes (Signed)
Cardiology Office Note    Date:  12/01/2015   ID:  Luke [name], DOB 1945/07/27, MRN [id number]  PCP:  Elsie Stain, MD  Cardiologist:   Candee Furbish, MD (former [name])    History of Present Illness:  Luke [name] is a 70 y.o. male Luke [name]) who presents today to follow-up coronary disease and cardiomyopathy. EF 35%.   Prior cervical disc surgery. This has been done and he is feeling much better. He still has numbness in his hands which will hopefully slowly improved. Overall his activity level is much better. He is really remarkably active. He is on appropriate medications for his cardiomyopathy. He has no signs of ongoing significant CHF. He has no chest pain.  [place] 2013 - 2V CAD Left anterior descending (LAD): focal 70-80% proximal. Long 40% mid vessel. Right coronary artery (RCA): Diffusely diseased. 50-60% mid vessel, 70% distal, 70% at bifurcation of PDA/PLOM.  LV Ejection Fraction: 30%. He underwent catheterization which confirmed severe LV dysfunction with moderate to severe 2 vessel disease not thought to be appropriate for intervention.   Overall he is been doing very well, exercises regularly at the [employer], daily treadmill for miles. No anginal symptoms. He is not experiencing any significant claudication. Peripheral vascular disease is being followed at vascular surgery office. He underwent a cervical neck surgery and is improved as stated above.No chest pain, no syncope, no orthopnea, no PND. He played golf the other day. It is been quite some time since he had done this secondary to his neuropathy. He has been pleased with his exercise effort and vascular has been following his lower extremities. His left leg has improved.   Past Medical History:  Diagnosis Date  . AAA (abdominal aortic aneurysm) ([place]) 2011   Per vascular surgery  . Anxiety    no med. in use for anxiety but pt. speaks openly of his stress & anxious feelings regarding impending surgery    .  Atrial fibrillation ([place])   . Automatic implantable cardioverter-defibrillator in situ   . CAD (coronary artery disease)    Presumed CAD with nuclear scan October 09, 2011,  large anteroseptal MI and inferior MI. Catheterization scheduled October 15, 2011  . Cardiomyopathy [facility])    Nuclear, October 09, 2011, EF 30%, multiple focal wall motion abnormalities  . Diabetes mellitus    type II  . Drug therapy    Hyperkalemia with spironolactone  . Ejection fraction < 50%    EF 30%, nuclear, October 09, 2011  . Ejection fraction < 50%    EF previously 30%  //   EF 30-35%, echo, July 14, 2012, severe diffuse hypokinesis, PA pressure 43 mm mercury  . Fall due to ice or snow Feb.  19, 2015  . HLD (hyperlipidemia)   . Hypertension    white coat HTN-- often elevated in office and controlled on outside checks.  . ICD (implantable cardioverter-defibrillator) in place    CRT-D placed March, 2014 complete heart block and k dysfunction  . LBBB (left bundle branch block)    LBBB on EKG October 11, 2011,  no prior EKG has been done  . Low testosterone    Hx of  . Myocardial infarction ([place])   . Pacemaker   . PAD (peripheral artery disease) ([place])     Past Surgical History:  Procedure Laterality Date  . ABDOMINAL AORTAGRAM N/A 10/28/2011   Procedure: ABDOMINAL [name];  Surgeon: Angelia Mould, MD;  Location: [facility] CATH LAB;  Service: Cardiovascular;  Laterality: N/A;  . ABDOMINAL AORTIC ANEURYSM REPAIR     EVAR   . BI-VENTRICULAR IMPLANTABLE CARDIOVERTER DEFIBRILLATOR N/A 07/16/2012   Procedure: BI-VENTRICULAR IMPLANTABLE CARDIOVERTER DEFIBRILLATOR  (CRT-D);  Surgeon: Deboraha Sprang, MD;  Location: [facility] CATH LAB;  Service: Cardiovascular;  Laterality: N/A;  . CARDIAC CATHETERIZATION    . COLONOSCOPY    . HERNIA REPAIR  Jan. 9, 2015  . INSERTION OF MESH N/A 05/14/2013   Procedure: INSERTION OF MESH;  Surgeon: Adin Hector, MD;  [facility] [place];  Service: General;  Laterality: N/A;  . LOWER EXTREMITY ANGIOGRAM  Bilateral 10/28/2011   Procedure: LOWER EXTREMITY ANGIOGRAM;  Surgeon: Angelia Mould, MD;  Location: [facility] CATH LAB;  Service: Cardiovascular;  Laterality: Bilateral;  . PACEMAKER INSERTION  07-16-12   pacemaker/defibrilator  . POSTERIOR CERVICAL FUSION/FORAMINOTOMY N/A 06/09/2014   Procedure: Laminectomy - Cervical two-Cervcial four posterior cervical instrumented fusion Cervical two-cervical four;  Surgeon: Eustace Moore, MD;  Location: [place] NEURO ORS;  Service: Neurosurgery;  Laterality: N/A;  posterior   . TONSILLECTOMY     as a child   . UMBILICAL HERNIA REPAIR N/A 05/14/2013   Procedure: LAPAROSCOPIC exploration and repair of hernia in abdominal ;  Surgeon: Adin Hector, MD;  Location: [place];  Service: General;  Laterality: N/A;    Outpatient Medications Prior to Visit  Medication Sig Dispense Refill  . amLODipine (NORVASC) 2.5 MG tablet Take 1 tablet (2.5 mg total) by mouth daily. 90 tablet 2  . aspirin EC 81 MG tablet Take 81 mg by mouth daily.    . carvedilol (COREG) 25 MG tablet Take 1 tablet (25 mg total) by mouth 2 (two) times daily with a meal. 60 tablet 8  . glipiZIDE (GLUCOTROL) 5 MG tablet Take 2 tablets (10 mg total) by mouth 2 (two) times daily before a meal. 360 tablet 3  . glucose blood (ACCU-CHEK AVIVA PLUS) test strip Use to check blood sugar daily. DX: E11.51 100 each 3  . lisinopril (PRINIVIL,ZESTRIL) 20 MG tablet Take 1 tablet (20 mg total) by mouth 2 (two) times daily. 180 tablet 3  . metFORMIN (GLUCOPHAGE) 500 MG tablet take 2 tablets by mouth twice a day with food 360 tablet 3  . Multiple Vitamin (MULTIVITAMIN) tablet Take 1 tablet by mouth daily.     . Probiotic Product (PROBIOTIC & ACIDOPHILUS EX ST PO) Take 1 capsule by mouth daily.     . sildenafil (REVATIO) 20 MG tablet Take 3-4 tablets by mouth as needed daily. 50 tablet 12  . simvastatin (ZOCOR) 40 MG tablet Take 1 tablet (40 mg total) by mouth at bedtime. 90 tablet 3  . vitamin B-12 (CYANOCOBALAMIN) 500  MCG tablet Take 500 mcg by mouth daily.     No facility-administered medications prior to visit.      Allergies:   Aldactone [spironolactone] and Codeine   Social History   Social History  . Marital status: Married    Spouse name: N/A  . Number of children: N/A  . Years of education: N/A   Social History Main Topics  . Smoking status: Former Smoker    Packs/day: 2.00    Years: 40.00    Types: Cigarettes    Quit date: 05/06/2004  . Smokeless tobacco: Never Used     Comment: quit about 7 years ago  . Alcohol use 1.2 oz/week    2 Cans of beer per week     Comment: beer or wine  occassionally  . Drug  use: No  . Sexual activity: Not Asked   Other Topics Concern  . None   Social History Narrative   [place] vet   retired     Family History:  The patient's family history includes Cancer in his father; Diabetes in his sister; Heart attack in his sister; Heart disease in his sister; Hyperlipidemia in his mother and sister; Hypertension in his mother, sister, and son; Stroke in his mother.   ROS:   Please see the history of present illness.    ROS All other systems reviewed and are negative.   PHYSICAL EXAM:   VS:  BP (!) 130/100 (BP Location: Right Arm, Patient Position: Sitting, Cuff Size: Normal)   Pulse 76   Ht 6\' 3"  (1.905 m)   Wt 189 lb 12.8 oz (86.1 kg)   SpO2 96%   BMI 23.72 kg/m    GEN: Well nourished, well developed, in no acute distress HEENT: normal Neck: no JVD, carotid bruits, or masses Cardiac: RRR; no murmurs, rubs, or gallops,no edema ICD noted Respiratory:  clear to auscultation bilaterally, normal work of breathing GI: soft, nontender, nondistended, + BS, protuberant abdomen MS: no deformity or atrophy Skin: warm and dry, no rash, right leg barely palpable pulses, left leg palpable pulses Neuro:  Alert and Oriented x 3, Strength and sensation are intact Psych: euthymic mood, full affect  Wt Readings from Last 3 Encounters:  12/01/15 189 lb 12.8  oz (86.1 kg)  11/21/15 177 lb 8 oz (80.5 kg)  11/14/15 185 lb 8 oz (84.1 kg)      Studies/Labs Reviewed:   EKG:  EKG is ordered today.  The ekg ordered today demonstrates 05/25/15-sinus rhythm, 66, biventricular pacing spikes noted heart rate 66 bpm. Her smoking reviewed.  Recent Labs: 11/14/2015: ALT 18; BUN 27; Creatinine, Ser 1.31; Potassium 4.8; Sodium 137   Lipid Panel    Component Value Date/Time   CHOL 196 11/14/2015 0828   TRIG 381.0 (H) 11/14/2015 0828   HDL 29.30 (L) 11/14/2015 0828   CHOLHDL 7 11/14/2015 0828   VLDL 76.2 (H) 11/14/2015 0828   LDLCALC 53 09/13/2013 0847   LDLDIRECT 91.0 11/14/2015 0828    Additional studies/ records that were reviewed today include:  Prior office notes, lab, echocardiogram, cardiac catheterization, lab work reviewed    ASSESSMENT:    1. Ischemic cardiomyopathy   2. Chronic systolic heart failure ([place])   3. Biventricular ICD (implantable cardioverter-defibrillator) in place   4. PVD (peripheral vascular disease) ([place])      PLAN:  In order of problems listed above:  1. Underwent CRT/ICD [place] implantation for intermittent complete heart block in the setting of ischemic cardiomyopathy and left bundle branch block. 2. Stable without any exertional symptoms, disease on cardiac catheterization reviewed. We did discuss Previously why he did not undergo revascularization. 3. Per Dr. Caryl Comes. Prior note reviewed. 4. Left leg distal pulses palpable, challenging to feel pulses on right leg. This correlates with his ABIs. He is not expressing any significant claudication during exercise. He is able to walk on the treadmill for 4 miles a day. He goes to [employer]. Vascular has been monitoring. His left leg has improved. 5. On beta blocker as well as ACE inhibitor, at goal dosing. Stable.    Medication Adjustments/Labs and Tests Ordered: Current medicines are reviewed at length with the patient today.  Concerns regarding medicines  are outlined above.  Medication changes, Labs and Tests ordered today are listed in the Patient Instructions below. Patient  Instructions  Medication Instructions:  The current medical regimen is effective;  continue present plan and medications.  Follow-Up: Follow up in 6 months with Dr. Marlou Porch.  You will receive a letter in the mail 2 months before you are due.  Please call us when you receive this letter to schedule your follow up appointment.  If you need a refill on your cardiac medications before your next appointment, please call your pharmacy.  Thank you for choosing [facility]!!          Signed, Candee Furbish, MD  12/01/2015 8:55 AM    [address], [place], [place]  [address] Phone: [phone]; Fax: [phone]

## 2015-12-01 NOTE — Patient Instructions (Signed)

## 2016-01-01 ENCOUNTER — Ambulatory Visit (INDEPENDENT_AMBULATORY_CARE_PROVIDER_SITE_OTHER): Payer: Medicare Other | Admitting: *Deleted

## 2016-01-01 DIAGNOSIS — Z95810 Presence of automatic (implantable) cardiac defibrillator: Secondary | ICD-10-CM

## 2016-01-01 DIAGNOSIS — I5022 Chronic systolic (congestive) heart failure: Secondary | ICD-10-CM

## 2016-01-01 DIAGNOSIS — I255 Ischemic cardiomyopathy: Secondary | ICD-10-CM | POA: Diagnosis not present

## 2016-01-02 NOTE — Progress Notes (Signed)
Remote ICD transmission.   

## 2016-01-04 ENCOUNTER — Encounter: Payer: Self-pay | Admitting: Cardiology

## 2016-01-11 LAB — CUP PACEART REMOTE DEVICE CHECK
Battery Remaining Longevity: 50 mo
Battery Remaining Percentage: 56 %
Battery Voltage: 2.95 V
Brady Statistic AP VP Percent: 13 %
Brady Statistic AP VS Percent: 1 %
Brady Statistic AS VP Percent: 87 %
Brady Statistic AS VS Percent: 1 %
Brady Statistic RA Percent Paced: 13 %
Date Time Interrogation Session: 20170828060021
HighPow Impedance: 86 Ohm
HighPow Impedance: 86 Ohm
Implantable Lead Implant Date: 20140313
Implantable Lead Implant Date: 20140313
Implantable Lead Implant Date: 20140313
Implantable Lead Location: 753858
Implantable Lead Location: 753859
Implantable Lead Location: 753860
Lead Channel Impedance Value: 1025 Ohm
Lead Channel Impedance Value: 460 Ohm
Lead Channel Impedance Value: 510 Ohm
Lead Channel Pacing Threshold Amplitude: 0.875 V
Lead Channel Pacing Threshold Amplitude: 1 V
Lead Channel Pacing Threshold Amplitude: 1.5 V
Lead Channel Pacing Threshold Pulse Width: 0.5 ms
Lead Channel Pacing Threshold Pulse Width: 0.5 ms
Lead Channel Pacing Threshold Pulse Width: 0.6 ms
Lead Channel Sensing Intrinsic Amplitude: 11.9 mV
Lead Channel Sensing Intrinsic Amplitude: 4.7 mV
Lead Channel Setting Pacing Amplitude: 2 V
Lead Channel Setting Pacing Amplitude: 2 V
Lead Channel Setting Pacing Amplitude: 2.5 V
Lead Channel Setting Pacing Pulse Width: 0.5 ms
Lead Channel Setting Pacing Pulse Width: 0.6 ms
Lead Channel Setting Sensing Sensitivity: 0.5 mV
Pulse Gen Serial Number: 7097007

## 2016-01-12 ENCOUNTER — Telehealth: Payer: Self-pay | Admitting: Cardiology

## 2016-01-12 NOTE — Telephone Encounter (Signed)
LMOVM requesting that pt send manual transmission b/c home monitor has not updated in at least 8 days.   

## 2016-01-19 ENCOUNTER — Encounter: Payer: Self-pay | Admitting: Cardiology

## 2016-01-26 ENCOUNTER — Telehealth: Payer: Self-pay | Admitting: Cardiology

## 2016-01-26 ENCOUNTER — Encounter: Payer: Self-pay | Admitting: Family

## 2016-01-26 NOTE — Telephone Encounter (Signed)
Spoke w/ pt and requested that he send a manual transmission b/c his home monitor has not updated in at least 14 days.   

## 2016-01-31 ENCOUNTER — Ambulatory Visit (HOSPITAL_COMMUNITY)
Admission: RE | Admit: 2016-01-31 | Discharge: 2016-01-31 | Disposition: A | Discharge status: Home / Self Care | Payer: Medicare Other | Source: Ambulatory Visit | Attending: Vascular Surgery | Admitting: Vascular Surgery

## 2016-01-31 ENCOUNTER — Encounter: Payer: Self-pay | Admitting: Family

## 2016-01-31 ENCOUNTER — Ambulatory Visit (INDEPENDENT_AMBULATORY_CARE_PROVIDER_SITE_OTHER): Payer: Medicare Other | Admitting: Family

## 2016-01-31 VITALS — BP 176/88 | HR 61 | Temp 97.2°F | Resp 18 | Ht 75.0 in | Wt 188.0 lb

## 2016-01-31 DIAGNOSIS — I779 Disorder of arteries and arterioles, unspecified: Secondary | ICD-10-CM | POA: Diagnosis not present

## 2016-01-31 DIAGNOSIS — Z87891 Personal history of nicotine dependence: Secondary | ICD-10-CM

## 2016-01-31 DIAGNOSIS — I714 Abdominal aortic aneurysm, without rupture, unspecified: Secondary | ICD-10-CM

## 2016-01-31 DIAGNOSIS — I70211 Atherosclerosis of native arteries of extremities with intermittent claudication, right leg: Secondary | ICD-10-CM

## 2016-01-31 DIAGNOSIS — Z95828 Presence of other vascular implants and grafts: Secondary | ICD-10-CM | POA: Insufficient documentation

## 2016-01-31 NOTE — Progress Notes (Signed)
Vitals:   01/31/16 0837  BP: (!) 177/92  Pulse: 70  Resp: 18  Temp: 97.2 F (36.2 C)  SpO2: 98%  Weight: 188 lb (85.3 kg)  Height: 6\' 3"  (1.905 m)

## 2016-01-31 NOTE — Progress Notes (Signed)
[facility] [place]  CC: Follow up s/p EVAR  History of Present Illness  Luke [name] is a 70 y.o. (07-14-1945) male patient of Dr. Scot Dock who is s/p EVAR of a 6 cm infrarenal abdominal aortic aneurysm on 12/03/2011 and returns for routine follow up.   Pt states his blood pressure is usually 120's/70's, he checks it at home.He states that his blood pressure is always high in a medical facility.  He had c-spine surgery in February 2016, he has obtained considerable relief of his symptoms.  He denies back pain from this or any other source, also denies abdominal pain. He exercises at a gym at least 5 days/week and walks a great deal.  He takes a daily statin, ASA, and a beta blocker. He denies any history of stroke or TIA. He did have a silent MI, now has a pacemaker and defibrillator. He has peripheral neuropathy.  He has mild claudication in his right calf and none in his left leg with walking; right calf claudication starts after 6 minutes walking, and resolves after 9 minutes as he continues to walk, denies non healing wounds.   Pt Diabetic: Yes, states his last A1C was 8.0, uncontrolled Pt smoker: former smoker, quit in 2006, smoked for 50 years   Past Medical History:  Diagnosis Date  . AAA (abdominal aortic aneurysm) ([place]) 2011   Per vascular surgery  . Anxiety    no med. in use for anxiety but pt. speaks openly of his stress & anxious feelings regarding impending surgery    . Atrial fibrillation ([place])   . Automatic implantable cardioverter-defibrillator in situ   . CAD (coronary artery disease)    Presumed CAD with nuclear scan October 09, 2011,  large anteroseptal MI and inferior MI. Catheterization scheduled October 15, 2011  . Cardiomyopathy [facility])    Nuclear, October 09, 2011, EF 30%, multiple focal wall motion abnormalities  . Diabetes mellitus    type II  . Drug therapy    Hyperkalemia with spironolactone  . Ejection fraction < 50%    EF 30%,  nuclear, October 09, 2011  . Ejection fraction < 50%    EF previously 30%  //   EF 30-35%, echo, July 14, 2012, severe diffuse hypokinesis, PA pressure 43 mm mercury  . Fall due to ice or snow Feb.  19, 2015  . HLD (hyperlipidemia)   . Hypertension    white coat HTN-- often elevated in office and controlled on outside checks.  . ICD (implantable cardioverter-defibrillator) in place    CRT-D placed March, 2014 complete heart block and k dysfunction  . LBBB (left bundle branch block)    LBBB on EKG October 11, 2011,  no prior EKG has been done  . Low testosterone    Hx of  . Myocardial infarction ([place])   . Pacemaker   . PAD (peripheral artery disease) ([place])    Past Surgical History:  Procedure Laterality Date  . ABDOMINAL AORTAGRAM N/A 10/28/2011   Procedure: ABDOMINAL [name];  Surgeon: Angelia Mould, MD;  Location: [facility] C[facility] LAB;  Service: Cardiovascular;  Laterality: N/A;  . ABDOMINAL AORTIC ANEURYSM REPAIR     EVAR   . BI-VENTRICULAR IMPLANTABLE CARDIOVERTER DEFIBRILLATOR N/A 07/16/2012   Procedure: BI-VENTRICULAR IMPLANTABLE CARDIOVERTER DEFIBRILLATOR  (CRT-D);  Surgeon: Deboraha Sprang, MD;  Location: [facility] CATH LAB;  Service: Cardiovascular;  Laterality: N/A;  . CARDIAC CATHETERIZATION    . COLONOSCOPY    . HERNIA REPAIR  Jan. 9,  2015  . INSERTION OF MESH N/A 05/14/2013   Procedure: INSERTION OF MESH;  Surgeon: Adin Hector, MD;  Location: [place];  Service: General;  Laterality: N/A;  . LOWER EXTREMITY ANGIOGRAM Bilateral 10/28/2011   Procedure: LOWER EXTREMITY ANGIOGRAM;  Surgeon: Angelia Mould, MD;  Location: [facility] CATH LAB;  Service: Cardiovascular;  Laterality: Bilateral;  . PACEMAKER INSERTION  07-16-12   pacemaker/defibrilator  . POSTERIOR CERVICAL FUSION/FORAMINOTOMY N/A 06/09/2014   Procedure: Laminectomy - Cervical two-Cervcial four posterior cervical instrumented fusion Cervical two-cervical four;  Surgeon: Eustace Moore, MD;  Location: [place] NEURO ORS;  Service:  Neurosurgery;  Laterality: N/A;  posterior   . TONSILLECTOMY     as a child   . UMBILICAL HERNIA REPAIR N/A 05/14/2013   Procedure: LAPAROSCOPIC exploration and repair of hernia in abdominal ;  Surgeon: Adin Hector, MD;  Location: [place] OR;  Service: General;  Laterality: N/A;   Social History Social History  Substance Use Topics  . Smoking status: Former Smoker    Packs/day: 2.00    Years: 40.00    Types: Cigarettes    Quit date: 05/06/2004  . Smokeless tobacco: Never Used     Comment: quit about 7 years ago  . Alcohol use 1.2 oz/week    2 Cans of beer per week     Comment: beer or wine  occassionally   Family History Family History  Problem Relation Age of Onset  . Hypertension Mother   . Stroke Mother   . Hyperlipidemia Mother   . Diabetes Sister   . Heart disease Sister     Before age 51  . Hypertension Sister   . Hyperlipidemia Sister   . Heart attack Sister   . Cancer Father     Lung  . Hypertension Son   . Colon cancer Neg Hx   . Prostate cancer Neg Hx    Current Outpatient Prescriptions on File Prior to Visit  Medication Sig Dispense Refill  . amLODipine (NORVASC) 2.5 MG tablet Take 1 tablet (2.5 mg total) by mouth daily. 90 tablet 2  . aspirin EC 81 MG tablet Take 81 mg by mouth daily.    . carvedilol (COREG) 25 MG tablet Take 1 tablet (25 mg total) by mouth 2 (two) times daily with a meal. 60 tablet 8  . glipiZIDE (GLUCOTROL) 5 MG tablet Take 2 tablets (10 mg total) by mouth 2 (two) times daily before a meal. 360 tablet 3  . glucose blood (ACCU-CHEK AVIVA PLUS) test strip Use to check blood sugar daily. DX: E11.51 100 each 3  . lisinopril (PRINIVIL,ZESTRIL) 20 MG tablet Take 1 tablet (20 mg total) by mouth 2 (two) times daily. 180 tablet 3  . metFORMIN (GLUCOPHAGE) 500 MG tablet take 2 tablets by mouth twice a day with food 360 tablet 3  . Multiple Vitamin (MULTIVITAMIN) tablet Take 1 tablet by mouth daily.     . Probiotic Product (PROBIOTIC & ACIDOPHILUS EX ST  PO) Take 1 capsule by mouth daily.     . sildenafil (REVATIO) 20 MG tablet Take 3-4 tablets by mouth as needed daily. 50 tablet 12  . simvastatin (ZOCOR) 40 MG tablet Take 1 tablet (40 mg total) by mouth at bedtime. 90 tablet 3  . vitamin B-12 (CYANOCOBALAMIN) 500 MCG tablet Take 500 mcg by mouth daily.     No current facility-administered medications on file prior to visit.    Allergies  Allergen Reactions  . Aldactone [Spironolactone] Other (See Comments)  Hyperkalemia Hyperkalemia  . Codeine Rash and Hives     ROS: See HPI for pertinent positives and negatives.  Physical Examination  Vitals:   01/31/16 0837 01/31/16 0840  BP: (!) 177/92 (!) 176/88  Pulse: 70 61  Resp: 18   Temp: 97.2 F (36.2 C)   SpO2: 98%   Weight: 188 lb (85.3 kg)   Height: 6\' 3"  (1.905 m)    Body mass index is 23.5 kg/m.  General: A&O x 3, WD, tall male.  Pulmonary: Sym exp, respirations are non labored, good air movt, CTAB, no rales, rhonchi, or wheezing.   Cardiac: RRR, Nl S1, S2, no murmur appreciated, left side chest pacemaker/defibrillaor palpated subcutaneously.  Vascular: Vessel Right Left  Radial 2+Palpable 2+Palpable  Carotid without bruit without bruit  Aorta Not palpable N/A  Femoral 2+Palpable 2+Palpable  Popliteal Not palpable Not palpable  PT Not Palpable 2+Palpable  DP Not Palpable Not Palpable   Gastrointestinal: soft, NTND, -G/R, - HSM, - palpable masses, - CVAT B.  Musculoskeletal: M/S 5/5 throughout, Extremities without ischemic changes.  Neurologic: CN 2-12 intact, Motor exam as listed above.        CTA Abd/Pelvis Duplex (Date: 07/01/12) Mild atheromatous plaque and nonocclusive eccentric mural thrombus in the visualized distal descending thoracic and suprarenal abdominal aorta. Patent bifurcated infrarenal aortic stent graft without endoleak. Native aneurysm sac diameter 5.5 x 6.4 cm, previously 5.9 x 6.6     Non-Invasive Vascular Imaging  EVAR Duplex (Date: 01/31/16)  AAA sac size: 4.0 cm x 3.9 cm  no endoleak detected    01/18/15: 3.79 cm x 3.93 cm     ABI (Date: 01/18/2015)  R: 0.59 (previous possibly in older paper records), DP: monophasic, PT: monophasic, TBI: 0.55  L: 1.06, DP: biphasic, PT: triphasic, TBI: 0.77   Medical Decision Making  Luke [name] is a 70 y.o. male who presents s/p EVAR 12/03/2011).  Pt is asymptomatic with a stable sac size at 4 cm, no endoleak. CTA abd/pelvis in 2014 showed a AAA sac size of 6.4 cm.    PAOD - He has mild claudication in his right calf which resolves with continued walking, no claudication in his left leg.  His atherosclerotic risk factors include uncontrolled DM, former smoker, and CAD.  He takes a daily statin and ASA.  He walks and exercises a great deal.   The next endograft duplex will be scheduled for 12 months, will also check ABI's.   The patient will follow up with [place] in 12 months with these studies.  I emphasized the importance of maximal medical management including strict control of blood pressure, blood glucose, and lipid levels, antiplatelet agents, obtaining regular exercise, and cessation of smoking.   Thank you for allowing [place] to participate in this patient's care.  Clemon Chambers, RN, MSN, FNP-C [facility] [place] Office: [phone]  Clinic Physician: Scot Dock  01/31/2016, 8:49 AM

## 2016-01-31 NOTE — Patient Instructions (Signed)
Intermittent Claudication Intermittent claudication is pain in your leg that occurs when you walk or exercise and goes away when you rest. The pain can occur in one or both legs. CAUSES Intermittent claudication is caused by the buildup of plaque within the major arteries in the body (atherosclerosis). The plaque, which makes arteries stiff and narrow, prevents enough blood from reaching your leg muscles. The pain occurs when you walk or exercise because your muscles need more blood when you are moving and exercising. RISK FACTORS Risk factors include:  A family history of atherosclerosis.  A personal history of stroke or heart disease.  Older age.  Being inactive or overweight.  Smoking cigarettes.  Having another health condition such as:  Diabetes.  High blood pressure.  High cholesterol. SIGNS AND SYMPTOMS  Your hip or leg may:   Ache.  Cramp.  Feel tight.  Feel weak.  Feel heavy. Over time, you may feel pain in your calf, thigh, or hip. DIAGNOSIS  Your health care provider may diagnose intermittent claudication based on your symptoms and medical history. Your health care provider may also do tests to learn more about your condition. These may include:  Blood tests.  An ultrasound.  Imaging tests such as angiography, magnetic resonance angiography (MRA), and computed tomography angiography (CTA). TREATMENT You may be treated for problems such as:  High blood pressure.  High cholesterol.  Diabetes. Other treatments may include:  Lifestyle changes such as:  Starting an exercise program.  Losing weight.  Quitting smoking.  Medicines to help restore blood flow through your legs.  Blood vessel surgery (angioplasty) to restore blood flow if your intermittent claudication is caused by severe peripheral artery disease. HOME CARE INSTRUCTIONS  Manage any other health conditions you have.  Eat a diet low in saturated fats and calories to maintain a  healthy weight.  Quit smoking, if you smoke.  Take medicines only as directed by your health care provider.  If your health care provider recommended an exercise program for you, follow it as directed. Your exercise program may involve:  Walking three or more times a week.  Walking until you have certain symptoms of intermittent claudication.  Resting until symptoms go away.  Gradually increasing walking time to about 50 minutes a day. SEEK MEDICAL CARE IF: Your condition is not getting better or is getting worse. SEEK IMMEDIATE MEDICAL CARE IF:   You have chest pain.  You have difficulty breathing.  You develop arm weakness.  You have trouble speaking.  Your face begins to droop. MAKE SURE YOU:  Understand these instructions.  Will watch your condition.  Will get help if you are not doing well or get worse.   This information is not intended to replace advice given to you by your health care provider. Make sure you discuss any questions you have with your health care provider.   Document Released: 02/23/2004 Document Revised: 05/13/2014 Document Reviewed: 07/29/2013 Elsevier Interactive Patient Education 2016 [employer] Inc[employer]  

## 2016-02-06 NOTE — Addendum Note (Signed)
Addended by: Kaleen Mask on: 02/06/2016 11:28 AM   Modules accepted: Orders

## 2016-02-09 ENCOUNTER — Telehealth: Payer: Self-pay | Admitting: Cardiology

## 2016-02-09 NOTE — Telephone Encounter (Signed)
LMOVM requesting that pt send manual transmission b/c home monitor has not updated in at least 8 days.   

## 2016-02-12 ENCOUNTER — Other Ambulatory Visit: Payer: Self-pay | Admitting: Family Medicine

## 2016-02-12 DIAGNOSIS — E1159 Type 2 diabetes mellitus with other circulatory complications: Secondary | ICD-10-CM

## 2016-02-14 ENCOUNTER — Other Ambulatory Visit (INDEPENDENT_AMBULATORY_CARE_PROVIDER_SITE_OTHER): Payer: Medicare Other

## 2016-02-14 DIAGNOSIS — E1159 Type 2 diabetes mellitus with other circulatory complications: Secondary | ICD-10-CM

## 2016-02-14 LAB — HEMOGLOBIN A1C: Hgb A1c MFr Bld: 7.9 % — ABNORMAL HIGH (ref 4.6–6.5)

## 2016-02-19 ENCOUNTER — Encounter: Payer: Self-pay | Admitting: Internal Medicine

## 2016-02-21 ENCOUNTER — Ambulatory Visit (INDEPENDENT_AMBULATORY_CARE_PROVIDER_SITE_OTHER): Payer: Medicare Other | Admitting: Family Medicine

## 2016-02-21 ENCOUNTER — Encounter: Payer: Self-pay | Admitting: Family Medicine

## 2016-02-21 VITALS — BP 150/78 | HR 62 | Temp 97.6°F | Wt 191.8 lb

## 2016-02-21 DIAGNOSIS — E1159 Type 2 diabetes mellitus with other circulatory complications: Secondary | ICD-10-CM

## 2016-02-21 DIAGNOSIS — Z23 Encounter for immunization: Secondary | ICD-10-CM | POA: Diagnosis not present

## 2016-02-21 DIAGNOSIS — I739 Peripheral vascular disease, unspecified: Secondary | ICD-10-CM

## 2016-02-21 DIAGNOSIS — I779 Disorder of arteries and arterioles, unspecified: Secondary | ICD-10-CM | POA: Diagnosis not present

## 2016-02-21 NOTE — Patient Instructions (Addendum)
Recheck in about 5-6 months, sooner if you have troubles.  Take care.  Glad to see you.  Don't change your meds for now.

## 2016-02-21 NOTE — Progress Notes (Signed)
Diabetes:  Using medications without difficulties:yes Hypoglycemic episodes:no Hyperglycemic episodes:no Feet problems:at baseline with change in sensation, though some better compared to prev baseline after his fall/injury.   Blood Sugars averaging: usually ~100 at night.  He has always had high sugars in the early AMs, likely dawn effect.  Later in the early AM his sugar is controlled.   eye exam within last year: f/u pending, scheduled.  A1c some better, d/w pt.  Prev with A1c >8.   We want to avoid lows.   He is back to exercising, he has tried to get back on a stair stepper to get his leg stronger.  He'll continue work on that.  Still walking and biking at the gym.    He has mild R calf breakthrough claudication after about 6 minutes, resolves after a few minutes of continued exercise, but that is clearly better compared to his historical baseline per his report.    BP is controlled at home.   Meds, vitals, and allergies reviewed.   ROS: Per HPI unless specifically indicated in ROS section   GEN: nad, alert and oriented HEENT: mucous membranes moist NECK: supple w/o LA CV: rrr. PULM: ctab, no inc wob ABD: soft, +bs EXT: no edema SKIN: no acute rash  Diabetic foot exam: Normal inspection No skin breakdown No calluses  Dec DP pulses B Dec sensation to light touch and monofilament B Nails thick

## 2016-02-21 NOTE — Progress Notes (Signed)
Pre visit review using our clinic review tool, if applicable. No additional management support is needed unless otherwise documented below in the visit note. 

## 2016-02-22 NOTE — Assessment & Plan Note (Signed)
A1c is slowly improving. He has no lows. He is approaching 70 years of age. A1c between 7 and 8 would be a reasonable goal. No change in medications at this point. Discussed with patient about routine foot care. Continue diet and exercise. He agrees with all of that. Recheck in a few months. Blood pressure has been controlled at home, he will update me as needed.

## 2016-02-22 NOTE — Assessment & Plan Note (Signed)
He still has breakthrough claudication in the right calf, but this is clearly improved from before. His exercise tolerance is improved. He has no poorly healing skin lesions. He has no skin breakdown. Continue as is with exercise routine and current medications. He will update me as needed.

## 2016-02-26 ENCOUNTER — Telehealth: Payer: Self-pay | Admitting: Internal Medicine

## 2016-02-26 NOTE — Telephone Encounter (Signed)
LMOVM informing pt that his monitor has updated and that it is ok to have his monitor in his office as long as he is in there more than an hour each day.

## 2016-02-26 NOTE — Telephone Encounter (Signed)
New message      Calling to see if we received his remote check today?  Also, pt keeps his monitor in his office not his bedroom-----is this ok?

## 2016-04-01 ENCOUNTER — Ambulatory Visit (INDEPENDENT_AMBULATORY_CARE_PROVIDER_SITE_OTHER): Payer: Medicare Other | Admitting: *Deleted

## 2016-04-01 ENCOUNTER — Telehealth: Payer: Self-pay | Admitting: Cardiology

## 2016-04-01 DIAGNOSIS — H2513 Age-related nuclear cataract, bilateral: Secondary | ICD-10-CM | POA: Diagnosis not present

## 2016-04-01 DIAGNOSIS — I255 Ischemic cardiomyopathy: Secondary | ICD-10-CM

## 2016-04-01 DIAGNOSIS — E113293 Type 2 diabetes mellitus with mild nonproliferative diabetic retinopathy without macular edema, bilateral: Secondary | ICD-10-CM | POA: Diagnosis not present

## 2016-04-01 DIAGNOSIS — I5022 Chronic systolic (congestive) heart failure: Secondary | ICD-10-CM

## 2016-04-01 LAB — HM DIABETES EYE EXAM

## 2016-04-01 NOTE — Telephone Encounter (Signed)
Spoke with pt and reminded pt of remote transmission that is due today. Pt verbalized understanding.   

## 2016-04-03 NOTE — Progress Notes (Signed)
Remote ICD transmission.   

## 2016-04-04 ENCOUNTER — Encounter: Payer: Self-pay | Admitting: Cardiology

## 2016-04-09 ENCOUNTER — Encounter: Payer: Self-pay | Admitting: Family Medicine

## 2016-04-19 ENCOUNTER — Encounter: Payer: Self-pay | Admitting: Cardiology

## 2016-05-01 LAB — CUP PACEART REMOTE DEVICE CHECK
Battery Remaining Percentage: 53 %
Brady Statistic RA Percent Paced: 15 %
Brady Statistic RV Percent Paced: 99 %
Date Time Interrogation Session: 20171227115407
HighPow Impedance: 75 Ohm
Implantable Lead Implant Date: 20140313
Implantable Lead Implant Date: 20140313
Implantable Lead Implant Date: 20140313
Implantable Lead Location: 753858
Implantable Lead Location: 753859
Implantable Lead Location: 753860
Implantable Pulse Generator Implant Date: 20140313
Lead Channel Impedance Value: 440 Ohm
Lead Channel Impedance Value: 450 Ohm
Lead Channel Impedance Value: 960 Ohm
Lead Channel Setting Pacing Amplitude: 2 V
Lead Channel Setting Pacing Amplitude: 2 V
Lead Channel Setting Pacing Amplitude: 2.5 V
Lead Channel Setting Pacing Pulse Width: 0.5 ms
Lead Channel Setting Pacing Pulse Width: 0.6 ms
Lead Channel Setting Sensing Sensitivity: 0.5 mV
Pulse Gen Serial Number: 7097007

## 2016-05-30 ENCOUNTER — Other Ambulatory Visit: Payer: Self-pay | Admitting: Cardiology

## 2016-06-03 ENCOUNTER — Ambulatory Visit (INDEPENDENT_AMBULATORY_CARE_PROVIDER_SITE_OTHER): Payer: Medicare Other | Admitting: Cardiology

## 2016-06-03 ENCOUNTER — Encounter: Payer: Self-pay | Admitting: Cardiology

## 2016-06-03 VITALS — BP 130/80 | HR 71 | Ht 75.0 in | Wt 193.0 lb

## 2016-06-03 DIAGNOSIS — Z95810 Presence of automatic (implantable) cardiac defibrillator: Secondary | ICD-10-CM

## 2016-06-03 DIAGNOSIS — I2583 Coronary atherosclerosis due to lipid rich plaque: Secondary | ICD-10-CM | POA: Diagnosis not present

## 2016-06-03 DIAGNOSIS — E1159 Type 2 diabetes mellitus with other circulatory complications: Secondary | ICD-10-CM | POA: Diagnosis not present

## 2016-06-03 DIAGNOSIS — I255 Ischemic cardiomyopathy: Secondary | ICD-10-CM

## 2016-06-03 DIAGNOSIS — I447 Left bundle-branch block, unspecified: Secondary | ICD-10-CM

## 2016-06-03 DIAGNOSIS — I2510 Atherosclerotic heart disease of native coronary artery without angina pectoris: Secondary | ICD-10-CM | POA: Diagnosis not present

## 2016-06-03 DIAGNOSIS — I739 Peripheral vascular disease, unspecified: Secondary | ICD-10-CM

## 2016-06-03 NOTE — Progress Notes (Signed)
Cardiology Office Note    Date:  06/03/2016   ID:  Luke [name], DOB Aug 08, 1945, MRN [id number]  PCP:  Elsie Stain, MD  Cardiologist:   Candee Furbish, MD (former [name])    History of Present Illness:  Luke [name] is a 71 y.o. male [name]) who presents today to follow-up coronary disease and cardiomyopathy. EF 35%.   Prior cervical disc surgery. This has been done and he is feeling much better. He still has numbness in his hands which will hopefully slowly improved. Overall his activity level is much better. He is really remarkably active. He is on appropriate medications for his cardiomyopathy. He has no signs of ongoing significant CHF. He has no chest pain.  [place] 2013 - 2V CAD Left anterior descending (LAD): focal 70-80% proximal. Long 40% mid vessel. Right coronary artery (RCA): Diffusely diseased. 50-60% mid vessel, 70% distal, 70% at bifurcation of PDA/PLOM.  LV Ejection Fraction: 30%. He underwent catheterization which confirmed severe LV dysfunction with moderate to severe 2 vessel disease not thought to be appropriate for intervention.   Overall he is been doing very well, exercises regularly at the [employer], daily treadmill for miles. No anginal symptoms. He is not experiencing any claudication. Peripheral vascular disease is being followed at vascular surgery office. He underwent a cervical neck surgery and is improved as stated above. No chest pain, no syncope, no orthopnea, no PND.  He has been pleased with his exercise effort and vascular has been following his lower extremities. His left leg has improved.   Past Medical History:  Diagnosis Date  . AAA (abdominal aortic aneurysm) ([place]) 2011   Per vascular surgery  . Anxiety    no med. in use for anxiety but pt. speaks openly of his stress & anxious feelings regarding impending surgery    . Atrial fibrillation ([place])   . Automatic implantable cardioverter-defibrillator in situ   . CAD (coronary artery disease)     Presumed CAD with nuclear scan October 09, 2011,  large anteroseptal MI and inferior MI. Catheterization scheduled October 15, 2011  . Cardiomyopathy [facility])    Nuclear, October 09, 2011, EF 30%, multiple focal wall motion abnormalities  . Diabetes mellitus    type II  . Drug therapy    Hyperkalemia with spironolactone  . Ejection fraction < 50%    EF 30%, nuclear, October 09, 2011  . Ejection fraction < 50%    EF previously 30%  //   EF 30-35%, echo, July 14, 2012, severe diffuse hypokinesis, PA pressure 43 mm mercury  . Fall due to ice or snow Feb.  19, 2015  . HLD (hyperlipidemia)   . Hypertension    white coat HTN-- often elevated in office and controlled on outside checks.  . ICD (implantable cardioverter-defibrillator) in place    CRT-D placed March, 2014 complete heart block and k dysfunction  . LBBB (left bundle branch block)    LBBB on EKG October 11, 2011,  no prior EKG has been done  . Low testosterone    Hx of  . Myocardial infarction   . Pacemaker   . PAD (peripheral artery disease) ([place])     Past Surgical History:  Procedure Laterality Date  . ABDOMINAL AORTAGRAM N/A 10/28/2011   Procedure: ABDOMINAL [name];  Surgeon: Angelia Mould, MD;  Location: [facility] CATH LAB;  Service: Cardiovascular;  Laterality: N/A;  . ABDOMINAL AORTIC ANEURYSM REPAIR     EVAR   . BI-VENTRICULAR IMPLANTABLE CARDIOVERTER DEFIBRILLATOR  N/A 07/16/2012   Procedure: BI-VENTRICULAR IMPLANTABLE CARDIOVERTER DEFIBRILLATOR  (CRT-D);  Surgeon: Deboraha Sprang, MD;  Location: [facility] CATH LAB;  Service: Cardiovascular;  Laterality: N/A;  . CARDIAC CATHETERIZATION    . COLONOSCOPY    . HERNIA REPAIR  Jan. 9, 2015  . INSERTION OF MESH N/A 05/14/2013   Procedure: INSERTION OF MESH;  Surgeon: Adin Hector, MD;  Location: [place];  Service: General;  Laterality: N/A;  . LOWER EXTREMITY ANGIOGRAM Bilateral 10/28/2011   Procedure: LOWER EXTREMITY ANGIOGRAM;  Surgeon: Angelia Mould, MD;  Location: [facility] CATH LAB;   Service: Cardiovascular;  Laterality: Bilateral;  . PACEMAKER INSERTION  07-16-12   pacemaker/defibrilator  . POSTERIOR CERVICAL FUSION/FORAMINOTOMY N/A 06/09/2014   Procedure: Laminectomy - Cervical two-Cervcial four posterior cervical instrumented fusion Cervical two-cervical four;  Surgeon: Eustace Moore, MD;  Location: [place] NEURO ORS;  Service: Neurosurgery;  Laterality: N/A;  posterior   . TONSILLECTOMY     as a child   . UMBILICAL HERNIA REPAIR N/A 05/14/2013   Procedure: LAPAROSCOPIC exploration and repair of hernia in abdominal ;  Surgeon: Adin Hector, MD;  Location: [place];  Service: General;  Laterality: N/A;    Outpatient Medications Prior to Visit  Medication Sig Dispense Refill  . amLODipine (NORVASC) 2.5 MG tablet TAKE 1 TABLET BY MOUTH ONCE DAILY 90 tablet 1  . aspirin EC 81 MG tablet Take 81 mg by mouth daily.    . carvedilol (COREG) 25 MG tablet Take 1 tablet (25 mg total) by mouth 2 (two) times daily with a meal. 60 tablet 8  . Cholecalciferol (VITAMIN D PO) Take 1,200 Int'l Units by mouth daily.    Marland Kitchen glipiZIDE (GLUCOTROL) 5 MG tablet Take 2 tablets (10 mg total) by mouth 2 (two) times daily before a meal. 360 tablet 3  . glucose blood (ACCU-CHEK AVIVA PLUS) test strip Use to check blood sugar daily. DX: E11.51 100 each 3  . lisinopril (PRINIVIL,ZESTRIL) 20 MG tablet Take 1 tablet (20 mg total) by mouth 2 (two) times daily. 180 tablet 3  . metFORMIN (GLUCOPHAGE) 500 MG tablet take 2 tablets by mouth twice a day with food 360 tablet 3  . Multiple Vitamin (MULTIVITAMIN) tablet Take 1 tablet by mouth daily.     . sildenafil (REVATIO) 20 MG tablet Take 3-4 tablets by mouth as needed daily. 50 tablet 12  . simvastatin (ZOCOR) 40 MG tablet Take 1 tablet (40 mg total) by mouth at bedtime. 90 tablet 3  . vitamin B-12 (CYANOCOBALAMIN) 500 MCG tablet Take 500 mcg by mouth daily.     No facility-administered medications prior to visit.      Allergies:   Aldactone [spironolactone] and  Codeine   Social History   Social History  . Marital status: Married    Spouse name: N/A  . Number of children: N/A  . Years of education: N/A   Social History Main Topics  . Smoking status: Former Smoker    Packs/day: 2.00    Years: 40.00    Types: Cigarettes    Quit date: 05/06/2004  . Smokeless tobacco: Never Used     Comment: quit about 7 years ago  . Alcohol use 1.2 oz/week    2 Cans of beer per week     Comment: beer or wine  occassionally  . Drug use: No  . Sexual activity: Not Asked   Other Topics Concern  . None   Social History Narrative   [place] vet  retired     Family History:  The patient's family history includes Cancer in his father; Diabetes in his sister; Heart attack in his sister; Heart disease in his sister; Hyperlipidemia in his mother and sister; Hypertension in his mother, sister, and son; Stroke in his mother.   ROS:   Please see the history of present illness.    ROS All other systems reviewed and are negative.   PHYSICAL EXAM:   VS:  BP 130/80   Pulse 71   Ht 6\' 3"  (1.905 m)   Wt 193 lb (87.5 kg)   BMI 24.12 kg/m    GEN: Well nourished, well developed, in no acute distress  HEENT: normal  Neck: no JVD, carotid bruits, or masses Cardiac: RRR; no murmurs, rubs, or gallops,no edema ICD noted Respiratory:  clear to auscultation bilaterally, normal work of breathing GI: soft, nontender, nondistended, + BS, protuberant abdomen MS: no deformity or atrophy  Skin: warm and dry, no rash, right leg barely palpable pulses, left leg palpable pulses Neuro:  Alert and Oriented x 3, Strength and sensation are intact Psych: euthymic mood, full affect  Wt Readings from Last 3 Encounters:  06/03/16 193 lb (87.5 kg)  02/21/16 191 lb 12 oz (87 kg)  01/31/16 188 lb (85.3 kg)      Studies/Labs Reviewed:   EKG:  EKG is ordered today.  The ekg ordered today demonstrates 05/25/15-sinus rhythm, 66, biventricular pacing spikes noted heart rate 66 bpm.  Her smoking reviewed.  Recent Labs: 11/14/2015: ALT 18; BUN 27; Creatinine, Ser 1.31; Potassium 4.8; Sodium 137   Lipid Panel    Component Value Date/Time   CHOL 196 11/14/2015 0828   TRIG 381.0 (H) 11/14/2015 0828   HDL 29.30 (L) 11/14/2015 0828   CHOLHDL 7 11/14/2015 0828   VLDL 76.2 (H) 11/14/2015 0828   LDLCALC 53 09/13/2013 0847   LDLDIRECT 91.0 11/14/2015 0828    Additional studies/ records that were reviewed today include:  Prior office notes, lab, echocardiogram, cardiac catheterization, lab work reviewed    ASSESSMENT:    1. Ischemic cardiomyopathy   2. LBBB (left bundle branch block)   3. PVD (peripheral vascular disease) ([place])   4. Coronary artery disease involving native coronary artery of native heart without angina pectoris   5. Type 2 diabetes mellitus with vascular disease ([place])   6. Biventricular ICD (implantable cardioverter-defibrillator) in place   7. Coronary artery disease due to lipid rich plaque      PLAN:  In order of problems listed above:  Ischemic cardiomyopathy/left bundle-branch block  - Underwent CRT/ICD St.Jude implantation for intermittent complete heart block in the setting of ischemic cardiomyopathy and left bundle branch block. Stable without any exertional symptoms, disease on cardiac catheterization reviewed. We did discuss Previously why he did not undergo revascularization. Per Dr. Caryl Comes. Prior note reviewed.  Peripheral vascular disease  - Left leg distal pulses palpable, challenging to feel pulses on right leg. This correlates with his ABIs. He is not expressing any significant claudication during exercise. He is able to walk on the treadmill for 4 miles a day. He goes to [employer]. Vascular has been monitoring. His left leg has improved.  Essential hypertension  - On beta blocker as well as ACE inhibitor, at goal dosing. Stable.  Diabetes with PVD  - per primary team.   Abdominal aortic aneurysm repair  - 2013,  stable  Medication Adjustments/Labs and Tests Ordered: Current medicines are reviewed at length with the patient today.  Concerns regarding medicines are outlined above.  Medication changes, Labs and Tests ordered today are listed in the Patient Instructions below. Patient Instructions  Medication Instructions:  The current medical regimen is effective;  continue present plan and medications.  Follow-Up: Follow up in 6 months with Dr. Marlou Porch.  You will receive a letter in the mail 2 months before you are due.  Please call us when you receive this letter to schedule your follow up appointment.  If you need a refill on your cardiac medications before your next appointment, please call your pharmacy.  Thank you for choosing [facility]!!          Signed, Candee Furbish, MD  06/03/2016 3:44 PM    [place], [place],   [address] Phone: [phone]; Fax: [phone]

## 2016-06-03 NOTE — Patient Instructions (Signed)

## 2016-06-04 ENCOUNTER — Telehealth: Payer: Self-pay | Admitting: Cardiology

## 2016-06-04 NOTE — Telephone Encounter (Signed)
LMOVM requesting that pt send manual transmission b/c home monitor has not updated in at least 7 days.    

## 2016-06-19 ENCOUNTER — Other Ambulatory Visit: Payer: Self-pay | Admitting: Family Medicine

## 2016-07-01 ENCOUNTER — Other Ambulatory Visit: Payer: Self-pay | Admitting: Cardiology

## 2016-07-02 ENCOUNTER — Ambulatory Visit (INDEPENDENT_AMBULATORY_CARE_PROVIDER_SITE_OTHER): Payer: Medicare Other | Admitting: Internal Medicine

## 2016-07-02 ENCOUNTER — Encounter: Payer: Self-pay | Admitting: Internal Medicine

## 2016-07-02 VITALS — BP 150/70 | HR 71

## 2016-07-02 DIAGNOSIS — I255 Ischemic cardiomyopathy: Secondary | ICD-10-CM

## 2016-07-02 DIAGNOSIS — Z95810 Presence of automatic (implantable) cardiac defibrillator: Secondary | ICD-10-CM | POA: Diagnosis not present

## 2016-07-02 DIAGNOSIS — I2589 Other forms of chronic ischemic heart disease: Secondary | ICD-10-CM

## 2016-07-02 DIAGNOSIS — I5022 Chronic systolic (congestive) heart failure: Secondary | ICD-10-CM | POA: Diagnosis not present

## 2016-07-02 DIAGNOSIS — I442 Atrioventricular block, complete: Secondary | ICD-10-CM | POA: Diagnosis not present

## 2016-07-02 LAB — CUP PACEART INCLINIC DEVICE CHECK
Brady Statistic RA Percent Paced: 14 %
Brady Statistic RV Percent Paced: 99.97 %
Date Time Interrogation Session: 20180227153902
HighPow Impedance: 81 Ohm
Implantable Lead Implant Date: 20140313
Implantable Lead Implant Date: 20140313
Implantable Lead Implant Date: 20140313
Implantable Lead Location: 753858
Implantable Lead Location: 753859
Implantable Lead Location: 753860
Implantable Pulse Generator Implant Date: 20140313
Lead Channel Impedance Value: 1075 Ohm
Lead Channel Impedance Value: 450 Ohm
Lead Channel Impedance Value: 450 Ohm
Lead Channel Pacing Threshold Amplitude: 0.75 V
Lead Channel Pacing Threshold Amplitude: 0.75 V
Lead Channel Pacing Threshold Amplitude: 0.75 V
Lead Channel Pacing Threshold Amplitude: 1.5 V
Lead Channel Pacing Threshold Amplitude: 1.5 V
Lead Channel Pacing Threshold Pulse Width: 0.5 ms
Lead Channel Pacing Threshold Pulse Width: 0.5 ms
Lead Channel Pacing Threshold Pulse Width: 0.5 ms
Lead Channel Pacing Threshold Pulse Width: 0.6 ms
Lead Channel Pacing Threshold Pulse Width: 0.6 ms
Lead Channel Sensing Intrinsic Amplitude: 12 mV
Lead Channel Sensing Intrinsic Amplitude: 4.4 mV
Lead Channel Setting Pacing Amplitude: 2 V
Lead Channel Setting Pacing Amplitude: 2 V
Lead Channel Setting Pacing Amplitude: 2.5 V
Lead Channel Setting Pacing Pulse Width: 0.5 ms
Lead Channel Setting Pacing Pulse Width: 0.6 ms
Lead Channel Setting Sensing Sensitivity: 0.5 mV
Pulse Gen Serial Number: 7097007

## 2016-07-02 NOTE — Patient Instructions (Addendum)
Medication Instructions: - Your physician recommends that you continue on your current medications as directed. Please refer to the Current Medication list given to you today.  Labwork: - none ordered  Procedures/Testing: - none ordred  Follow-Up: - Remote monitoring is used to monitor your Pacemaker of ICD from home. This monitoring reduces the number of office visits required to check your device to one time per year. It allows [place] to keep an eye on the functioning of your device to ensure it is working properly. You are scheduled for a device check from home on 10/01/16. You may send your transmission at any time that day. If you have a wireless device, the transmission will be sent automatically. After your physician reviews your transmission, you will receive a postcard with your next transmission date.  - Your physician wants you to follow-up in: 1 year with Tommye Standard, PA for Dr. Caryl Comes. You will receive a reminder letter in the mail two months in advance. If you don't receive a letter, please call our office to schedule the follow-up appointment.  Any Additional Special Instructions Will Be Listed Below (If Applicable).     If you need a refill on your cardiac medications before your next appointment, please call your pharmacy.

## 2016-07-02 NOTE — Progress Notes (Signed)
Patient Care Team: Tonia Ghent, MD as PCP - General Angelia Mould, MD as Attending Physician (Vascular Surgery) Carlena Bjornstad, MD (Cardiology) Arta Silence, MD as Consulting Physician (Gastroenterology) Michael Boston, MD as Consulting Physician (General Surgery) Eustace Moore, MD as Consulting Physician (Neurosurgery) Juluis Rainier as Referring Physician (Optometry)   HPI  Luke [name] is a 71 y.o. male Seen in followup for an CRT-  ICD implanted for intermittent complete heart block in the setting of ischemic cardiomyopathy and left bundle branch block.  LHC [id number] CAD Left anterior descending (LAD): focal 70-80% proximal. Long 40% mid vessel. Right coronary artery (RCA): Diffusely diseased. 50-60% mid vessel, 70% distal, 70% at bifurcation of PDA/PLOM.   LV Ejection Fraction: 30%. He underwent catheterization which confirmed severe LV dysfunction with moderate to severe 2 vessel disease not thought to be appropriate for intervention.    He is much improved following CRT-D implantation.  The patient denies chest pain, shortness of breath, nocturnal dyspnea, orthopnea or peripheral edema.  There have been no palpitations, lightheadedness or syncope.   He has been intolerant of Aldactone in the past because of hyperkalemia  Past Medical History:  Diagnosis Date  . AAA (abdominal aortic aneurysm) ([place]) 2011   Per vascular surgery  . Anxiety    no med. in use for anxiety but pt. speaks openly of his stress & anxious feelings regarding impending surgery    . Atrial fibrillation ([place])   . Automatic implantable cardioverter-defibrillator in situ   . CAD (coronary artery disease)    Presumed CAD with nuclear scan October 09, 2011,  large anteroseptal MI and inferior MI. Catheterization scheduled October 15, 2011  . Cardiomyopathy [facility])    Nuclear, October 09, 2011, EF 30%, multiple focal wall motion abnormalities  . Diabetes mellitus    type II  . Drug therapy    Hyperkalemia with spironolactone  . Ejection fraction < 50%    EF 30%, nuclear, October 09, 2011  . Ejection fraction < 50%    EF previously 30%  //   EF 30-35%, echo, July 14, 2012, severe diffuse hypokinesis, PA pressure 43 mm mercury  . Fall due to ice or snow Feb.  19, 2015  . HLD (hyperlipidemia)   . Hypertension    white coat HTN-- often elevated in office and controlled on outside checks.  . ICD (implantable cardioverter-defibrillator) in place    CRT-D placed March, 2014 complete heart block and k dysfunction  . LBBB (left bundle branch block)    LBBB on EKG October 11, 2011,  no prior EKG has been done  . Low testosterone    Hx of  . Myocardial infarction   . Pacemaker   . PAD (peripheral artery disease) ([place])     Past Surgical History:  Procedure Laterality Date  . ABDOMINAL AORTAGRAM N/A 10/28/2011   Procedure: ABDOMINAL [name];  Surgeon: Angelia Mould, MD;  Location: [facility] CATH LAB;  Service: Cardiovascular;  Laterality: N/A;  . ABDOMINAL AORTIC ANEURYSM REPAIR     EVAR   . BI-VENTRICULAR IMPLANTABLE CARDIOVERTER DEFIBRILLATOR N/A 07/16/2012   Procedure: BI-VENTRICULAR IMPLANTABLE CARDIOVERTER DEFIBRILLATOR  (CRT-D);  Surgeon: Deboraha Sprang, MD;  Location: [facility] CATH LAB;  Service: Cardiovascular;  Laterality: N/A;  . CARDIAC CATHETERIZATION    . COLONOSCOPY    . HERNIA REPAIR  Jan. 9, 2015  . INSERTION OF MESH N/A 05/14/2013   Procedure: INSERTION OF MESH;  Surgeon: Adin Hector,  MD;  Location: [place];  Service: General;  Laterality: N/A;  . LOWER EXTREMITY ANGIOGRAM Bilateral 10/28/2011   Procedure: LOWER EXTREMITY ANGIOGRAM;  Surgeon: Angelia Mould, MD;  Location: [facility] CATH LAB;  Service: Cardiovascular;  Laterality: Bilateral;  . PACEMAKER INSERTION  07-16-12   pacemaker/defibrilator  . POSTERIOR CERVICAL FUSION/FORAMINOTOMY N/A 06/09/2014   Procedure: Laminectomy - Cervical two-Cervcial four posterior cervical instrumented fusion Cervical two-cervical four;   Surgeon: Eustace Moore, MD;  Location: [place] NEURO ORS;  Service: Neurosurgery;  Laterality: N/A;  posterior   . TONSILLECTOMY     as a child   . UMBILICAL HERNIA REPAIR N/A 05/14/2013   Procedure: LAPAROSCOPIC exploration and repair of hernia in abdominal ;  Surgeon: Adin Hector, MD;  Location: [place];  Service: General;  Laterality: N/A;    Current Outpatient Prescriptions  Medication Sig Dispense Refill  . amLODipine (NORVASC) 2.5 MG tablet TAKE 1 TABLET BY MOUTH ONCE DAILY 90 tablet 1  . aspirin EC 81 MG tablet Take 81 mg by mouth daily.    . carvedilol (COREG) 25 MG tablet TAKE 1 TABLET BY MOUTH TWICE A DAY WITH MEALS 60 tablet 11  . Cholecalciferol (VITAMIN D PO) Take 1,200 Int'l Units by mouth daily.    Marland Kitchen glipiZIDE (GLUCOTROL) 5 MG tablet Take 2 tablets (10 mg total) by mouth 2 (two) times daily before a meal. 360 tablet 3  . glucose blood (ACCU-CHEK AVIVA PLUS) test strip Use to check blood sugar daily. DX: E11.51 100 each 3  . lisinopril (PRINIVIL,ZESTRIL) 20 MG tablet Take 1 tablet (20 mg total) by mouth 2 (two) times daily. 180 tablet 3  . metFORMIN (GLUCOPHAGE) 500 MG tablet take 2 tablets by mouth twice a day with food 360 tablet 3  . Multiple Vitamin (MULTIVITAMIN) tablet Take 1 tablet by mouth daily.     . sildenafil (REVATIO) 20 MG tablet Take 3-4 tablets by mouth as needed daily. 50 tablet 12  . simvastatin (ZOCOR) 40 MG tablet TAKE 1 TABLET BY MOUTH AT BEDTIME 90 tablet 1  . vitamin B-12 (CYANOCOBALAMIN) 500 MCG tablet Take 500 mcg by mouth daily.     No current facility-administered medications for this visit.     Allergies  Allergen Reactions  . Aldactone [Spironolactone] Other (See Comments)    Hyperkalemia Hyperkalemia  . Codeine Rash and Hives    Review of Systems negative except from HPI and PMH  Physical Exam BP (!) 150/70   Pulse 71  Well developed and nourished in no acute distress HENT normal Neck supple with JVP-flat Clear Device pocket well  healed; without hematoma or erythema.  There is no tethering there is atrophy Regular rate and rhythm, no murmurs or gallops Abd-soft with active BS No Clubbing cyanosis edema Skin-warm and dry A & Oriented  Grossly normal sensory and motor function   ECG demonstrates P-synchronous/ AV  pacing with CRT pacing with an up right QRS in lead V1 and a negative QRS in lead 1  Assessment and  Plan  Iscehmic Cardiomyopathy    Chronic HF systolic    Implantable Defibrillator CRT  St Jude  The patient's device was interrogated.  The information was reviewed.no reprogramming   Complete heart Block    Hypertension   Device Advisory - RECALL    The device was reprogrammed to improve his safety margin little bit. Otherwise he is quite well.  He is euvolemic.   He has no symptoms of ischemia.  His blood pressures  Is much improved and stable at home   We discussed the advisory recall. He is device dependent but the reported incidence remains very small.

## 2016-08-01 ENCOUNTER — Telehealth: Payer: Self-pay | Admitting: Cardiology

## 2016-08-01 NOTE — Telephone Encounter (Signed)
LMOVM requesting that pt send manual transmission b/c home monitor has not updated in at least 7 days.    

## 2016-08-11 ENCOUNTER — Other Ambulatory Visit: Payer: Self-pay | Admitting: Family Medicine

## 2016-08-11 DIAGNOSIS — E1159 Type 2 diabetes mellitus with other circulatory complications: Secondary | ICD-10-CM

## 2016-08-19 ENCOUNTER — Other Ambulatory Visit (INDEPENDENT_AMBULATORY_CARE_PROVIDER_SITE_OTHER): Payer: Medicare Other

## 2016-08-19 DIAGNOSIS — E1159 Type 2 diabetes mellitus with other circulatory complications: Secondary | ICD-10-CM

## 2016-08-19 LAB — HEMOGLOBIN A1C: Hgb A1c MFr Bld: 8.4 % — ABNORMAL HIGH (ref 4.6–6.5)

## 2016-08-21 ENCOUNTER — Other Ambulatory Visit: Payer: Self-pay | Admitting: Family Medicine

## 2016-08-21 NOTE — Telephone Encounter (Signed)
Received refill electronically Last refill 08/21/15 #50/12 Last office visit 02/21/16 Has appointment scheduled tomorrow 08/22/16

## 2016-08-22 ENCOUNTER — Ambulatory Visit (INDEPENDENT_AMBULATORY_CARE_PROVIDER_SITE_OTHER): Payer: Medicare Other | Admitting: Family Medicine

## 2016-08-22 ENCOUNTER — Encounter: Payer: Self-pay | Admitting: Family Medicine

## 2016-08-22 DIAGNOSIS — E1159 Type 2 diabetes mellitus with other circulatory complications: Secondary | ICD-10-CM

## 2016-08-22 DIAGNOSIS — I10 Essential (primary) hypertension: Secondary | ICD-10-CM | POA: Diagnosis not present

## 2016-08-22 DIAGNOSIS — I255 Ischemic cardiomyopathy: Secondary | ICD-10-CM

## 2016-08-22 MED ORDER — SITAGLIPTIN PHOSPHATE 100 MG PO TABS: 50.0000 mg | ORAL_TABLET | Freq: Every day | ORAL | 3 refills | Status: DC

## 2016-08-22 NOTE — Progress Notes (Signed)
Pre visit review using our clinic review tool, if applicable. No additional management support is needed unless otherwise documented below in the visit note. 

## 2016-08-22 NOTE — Progress Notes (Signed)
Hypertension:    Using medication without problems or lightheadedness: yes Chest pain with exertion:no Edema:no Short of breath:no Average home BPs: 130s/70s outside of clinic today.   Other issues: BP is always higher here, d/w pt.  This is a "good" BP for him at a clinic.  He agrees.    He has been taking sildenafil daily and it helps his foot pain.  It prev had a throbbing foot pain and this was the only med that helped.  advil didn't help.    Diabetes:  Using medications without difficulties:yes Hypoglycemic episodes:no Hyperglycemic episodes:no Feet problems:see above re: foot pain Blood Sugars averaging: usually <120 mid morning, he has dawn effect earlier in the day.  eye exam within last year:yes A1c up, d/wpt.   He has been working on diet and going to the gym.   He feels good, at baseline.    Meds, vitals, and allergies reviewed.   ROS: Per HPI unless specifically indicated in ROS section   GEN: nad, alert and oriented HEENT: mucous membranes moist NECK: supple w/o LA CV: rrr. PULM: ctab, no inc wob ABD: soft, +bs EXT: no edema SKIN: no acute rash  Diabetic foot exam: Normal inspection No skin breakdown but some thickening noted Dec but intact DP pulses Normal sensation to light touch but dec to monofilament Nails thickened slightly

## 2016-08-22 NOTE — Telephone Encounter (Signed)
Will give Rx to patient at [place] later today.

## 2016-08-22 NOTE — Telephone Encounter (Signed)
Printed. Please give to patient.  Thanks.

## 2016-08-22 NOTE — Patient Instructions (Addendum)
Add on januvia 50mg  a day.  If needed increase to 100mg  a day.  We may need to cut the glipizide back eventually.  Plan on physical in about 3 months, labs ahead of time.  Lantus insulin is another option with a daily shot.  Take care.  Glad to see you.

## 2016-08-23 NOTE — Assessment & Plan Note (Signed)
A1c is up in spite of diet and exercise and then use. Discussed with patient. He may need to taper off the glimepiride as its effectiveness may be decreasing. In the meantime add on Januvia and start with 50 mg a day. He agrees. If he has any low sugars or adverse effect on medication he will update me. It may be that he is having neuropathy symptoms in the feet due to small vessel vascular disease that is transiently improved with sildenafil use. Discussed with patient. Would continue daily sildenafil. Plan on recheck in 3 months.

## 2016-08-23 NOTE — Assessment & Plan Note (Signed)
Controlled out of office. His blood pressure is always higher in the office. Continue as is. He agrees

## 2016-09-16 DIAGNOSIS — E113293 Type 2 diabetes mellitus with mild nonproliferative diabetic retinopathy without macular edema, bilateral: Secondary | ICD-10-CM | POA: Diagnosis not present

## 2016-09-18 ENCOUNTER — Telehealth: Payer: Self-pay | Admitting: Cardiology

## 2016-09-18 NOTE — Telephone Encounter (Signed)
LMOVM requesting that pt send manual transmission b/c home monitor has not updated in at least 7 days.    

## 2016-10-01 ENCOUNTER — Ambulatory Visit (INDEPENDENT_AMBULATORY_CARE_PROVIDER_SITE_OTHER): Payer: Medicare Other | Admitting: *Deleted

## 2016-10-01 ENCOUNTER — Telehealth: Payer: Self-pay | Admitting: Cardiology

## 2016-10-01 DIAGNOSIS — I255 Ischemic cardiomyopathy: Secondary | ICD-10-CM

## 2016-10-01 NOTE — Telephone Encounter (Signed)
LMOVM reminding pt to send remote transmission.   

## 2016-10-01 NOTE — Progress Notes (Signed)
Remote ICD transmission.   

## 2016-10-02 LAB — CUP PACEART REMOTE DEVICE CHECK
Battery Remaining Longevity: 42 mo
Battery Remaining Percentage: 47 %
Battery Voltage: 2.93 V
Brady Statistic AP VP Percent: 16 %
Brady Statistic AP VS Percent: 1 %
Brady Statistic AS VP Percent: 84 %
Brady Statistic AS VS Percent: 1 %
Brady Statistic RA Percent Paced: 16 %
Date Time Interrogation Session: 20180529162652
HighPow Impedance: 72 Ohm
HighPow Impedance: 72 Ohm
Implantable Lead Implant Date: 20140313
Implantable Lead Implant Date: 20140313
Implantable Lead Implant Date: 20140313
Implantable Lead Location: 753858
Implantable Lead Location: 753859
Implantable Lead Location: 753860
Implantable Pulse Generator Implant Date: 20140313
Lead Channel Impedance Value: 440 Ohm
Lead Channel Impedance Value: 450 Ohm
Lead Channel Impedance Value: 950 Ohm
Lead Channel Pacing Threshold Amplitude: 0.75 V
Lead Channel Pacing Threshold Amplitude: 0.75 V
Lead Channel Pacing Threshold Amplitude: 1.5 V
Lead Channel Pacing Threshold Pulse Width: 0.5 ms
Lead Channel Pacing Threshold Pulse Width: 0.5 ms
Lead Channel Pacing Threshold Pulse Width: 0.6 ms
Lead Channel Sensing Intrinsic Amplitude: 3.8 mV
Lead Channel Sensing Intrinsic Amplitude: 8.1 mV
Lead Channel Setting Pacing Amplitude: 2 V
Lead Channel Setting Pacing Amplitude: 2 V
Lead Channel Setting Pacing Amplitude: 2.5 V
Lead Channel Setting Pacing Pulse Width: 0.5 ms
Lead Channel Setting Pacing Pulse Width: 0.6 ms
Lead Channel Setting Sensing Sensitivity: 0.5 mV
Pulse Gen Serial Number: 7097007

## 2016-10-04 ENCOUNTER — Encounter: Payer: Self-pay | Admitting: Cardiology

## 2016-10-16 ENCOUNTER — Telehealth: Payer: Self-pay | Admitting: Cardiology

## 2016-10-16 NOTE — Telephone Encounter (Signed)
Spoke w/ pt and requested that he send a manual transmission b/c his home monitor has not updated in at least 7 days.   

## 2016-10-23 ENCOUNTER — Other Ambulatory Visit: Payer: Self-pay | Admitting: Family Medicine

## 2016-11-07 NOTE — Progress Notes (Signed)
Subjective:   Luke [name] is a 71 y.o. male who presents for Medicare Annual/Subsequent preventive examination.  Review of Systems:  No ROS.  Medicare Wellness Visit. Additional risk factors are reflected in the social history.  Cardiac Risk Factors include: advanced age (>35men, >87 women);diabetes mellitus;dyslipidemia;hypertension;male gender     Objective:    Vitals: BP (!) 180/90   Pulse 69   Ht 6\' 1"  (1.854 m)   Wt 187 lb 8 oz (85 kg)   SpO2 98%   BMI 24.74 kg/m   Body mass index is 24.74 kg/m.  BP Readings from Last 3 Encounters:  11/19/16 (!) 180/90  08/22/16 (!) 156/86  07/02/16 (!) 150/70    Tobacco History  Smoking Status  . Former Smoker  . Packs/day: 2.00  . Years: 40.00  . Types: Cigarettes  . Quit date: 05/06/2004  Smokeless Tobacco  . Never Used    Comment: quit about 7 years ago     Counseling given: Not Answered   Past Medical History:  Diagnosis Date  . AAA (abdominal aortic aneurysm) ([place]) 2011   Per vascular surgery  . Anxiety    no med. in use for anxiety but pt. speaks openly of his stress & anxious feelings regarding impending surgery    . Atrial fibrillation ([place])   . Automatic implantable cardioverter-defibrillator in situ   . CAD (coronary artery disease)    Presumed CAD with nuclear scan October 09, 2011,  large anteroseptal MI and inferior MI. Catheterization scheduled October 15, 2011  . Cardiomyopathy [place] [facility])    Nuclear, October 09, 2011, EF 30%, multiple focal wall motion abnormalities  . Diabetes mellitus    type II  . Drug therapy    Hyperkalemia with spironolactone  . Ejection fraction < 50%    EF 30%, nuclear, October 09, 2011  . Ejection fraction < 50%    EF previously 30%  //   EF 30-35%, echo, July 14, 2012, severe diffuse hypokinesis, PA pressure 43 mm mercury  . Fall due to ice or snow Feb.  19, 2015  . HLD (hyperlipidemia)   . Hypertension    white coat HTN-- often elevated in office and controlled on outside checks.    . ICD (implantable cardioverter-defibrillator) in place    CRT-D placed March, 2014 complete heart block and k dysfunction  . LBBB (left bundle branch block)    LBBB on EKG October 11, 2011,  no prior EKG has been done  . Low testosterone    Hx of  . Myocardial infarction ([place])   . Pacemaker   . PAD (peripheral artery disease) ([place])    Past Surgical History:  Procedure Laterality Date  . ABDOMINAL AORTAGRAM N/A 10/28/2011   Procedure: ABDOMINAL [name];  Surgeon: Angelia Mould, MD;  Location[facility] LAB;  Service: Cardiovascular;  Laterality: N/A;  . ABDOMINAL AORTIC ANEURYSM REPAIR     EVAR   . BI-VENTRICULAR IMPLANTABLE CARDIOVERTER DEFIBRILLATOR N/A 07/16/2012   Procedure: BI-VENTRICULAR IMPLANTABLE CARDIOVERTER DEFIBRILLATOR  (CRT-D);  Surgeon: Deboraha Sprang, MD;  Location: [facility] CATH LAB;  Service: Cardiovascular;  Laterality: N/A;  . CARDIAC CATHETERIZATION    . COLONOSCOPY    . HERNIA REPAIR  Jan. 9, 2015  . INSERTION OF MESH N/A 05/14/2013   Procedure: INSERTION OF MESH;  Surgeon: Adin Hector, MD;  Location: [facility];  Service: General;  Laterality: N/A;  . LOWER EXTREMITY ANGIOGRAM Bilateral 10/28/2011   Procedure: LOWER EXTREMITY ANGIOGRAM;  Surgeon: Judeth Cornfield  Scot Dock, MD;  Location: [facility] CATH LAB;  Service: Cardiovascular;  Laterality: Bilateral;  . PACEMAKER INSERTION  07-16-12   pacemaker/defibrilator  . POSTERIOR CERVICAL FUSION/FORAMINOTOMY N/A 06/09/2014   Procedure: Laminectomy - Cervical two-Cervcial four posterior cervical instrumented fusion Cervical two-cervical four;  Surgeon: Eustace Moore, MD;  Location: [facility] NEURO ORS;  Service: Neurosurgery;  Laterality: N/A;  posterior   . TONSILLECTOMY     as a child   . UMBILICAL HERNIA REPAIR N/A 05/14/2013   Procedure: LAPAROSCOPIC exploration and repair of hernia in abdominal ;  Surgeon: Adin Hector, MD;  Location: [facility] OR;  Service: General;  Laterality: N/A;   Family History  Problem Relation Age of Onset  .  Hypertension Mother   . Stroke Mother   . Hyperlipidemia Mother   . Diabetes Sister   . Heart disease Sister        Before age 39  . Hypertension Sister   . Hyperlipidemia Sister   . Heart attack Sister   . Cancer Father        Lung  . Hypertension Son   . Colon cancer Neg Hx   . Prostate cancer Neg Hx    History  Sexual Activity  . Sexual activity: Yes    Outpatient Encounter Prescriptions as of 11/19/2016  Medication Sig  . ACCU-CHEK AVIVA PLUS test strip USE TO CHECK BLOOD SUGAR DAILY. DX: E11.51  . amLODipine (NORVASC) 2.5 MG tablet TAKE 1 TABLET BY MOUTH ONCE DAILY  . aspirin EC 81 MG tablet Take 81 mg by mouth daily.  . carvedilol (COREG) 25 MG tablet TAKE 1 TABLET BY MOUTH TWICE A DAY WITH MEALS  . Cholecalciferol (VITAMIN D PO) Take 1,200 Int'l Units by mouth daily.  Marland Kitchen glipiZIDE (GLUCOTROL) 5 MG tablet Take 2 tablets (10 mg total) by mouth 2 (two) times daily before a meal.  . lisinopril (PRINIVIL,ZESTRIL) 20 MG tablet Take 1 tablet (20 mg total) by mouth 2 (two) times daily.  . metFORMIN (GLUCOPHAGE) 500 MG tablet take 2 tablets by mouth twice a day with food  . Multiple Vitamin (MULTIVITAMIN) tablet Take 1 tablet by mouth daily.   . sildenafil (REVATIO) 20 MG tablet TAKE 3 TO 4 TABLETS BY MOUTH AS NEEDED DAILY  . simvastatin (ZOCOR) 40 MG tablet TAKE 1 TABLET BY MOUTH AT BEDTIME  . sitaGLIPtin (JANUVIA) 100 MG tablet Take 0.5-1 tablets (50-100 mg total) by mouth daily. (Patient not taking: Reported on 11/19/2016)  . vitamin B-12 (CYANOCOBALAMIN) 500 MCG tablet Take 500 mcg by mouth daily.   No facility-administered encounter medications on file as of 11/19/2016.     Activities of Daily Living In your present state of health, do you have any difficulty performing the following activities: 11/19/2016  Hearing? N  Vision? N  Difficulty concentrating or making decisions? N  Walking or climbing stairs? N  Dressing or bathing? N  Doing errands, shopping? N  Preparing  Food and eating ? N  Using the Toilet? N  In the past six months, have you accidently leaked urine? N  Do you have problems with loss of bowel control? N  Managing your Medications? N  Managing your Finances? N  Housekeeping or managing your Housekeeping? N  Some recent data might be hidden    Patient Care Team: Tonia Ghent, MD as PCP - General Angelia Mould, MD (Vascular Surgery) Arta Silence, MD (Gastroenterology) Michael Boston, MD (General Surgery) Eustace Moore, MD (Neurosurgery) Idolina Primer Warnell Bureau [facility]) Candee Furbish  C, MD (Cardiology)   Assessment:    Physical assessment deferred to PCP.  Exercise Activities and Dietary recommendations Current Exercise Habits: Home exercise routine, Type of exercise: treadmill;Other - see comments;strength training/weights (stationary bike), Time (Minutes): > 60, Frequency (Times/Week): 5, Weekly Exercise (Minutes/Week): 0, Intensity: Moderate  Goals    . Increase physical activity (pt-stated)          Starting 11/19/2016, I will continue to exercise for at least 1 hr 45 min 5 days per week.       Fall Risk Fall Risk  11/19/2016 11/14/2015 10/18/2014 03/18/2013  Falls in the past year? No No No No   Depression Screen PHQ 2/9 Scores 11/19/2016 11/14/2015 10/18/2014 03/18/2013  PHQ - 2 Score 0 0 0 0    Cognitive Function PLEASE NOTE: A Mini-Cog screen was completed. Maximum score is 20. A value of 0 denotes this part of [place] MMSE was not completed or the patient failed this part of the Mini-Cog screening.   Mini-Cog Screening Orientation to Time - Max 5 pts Orientation to Place - Max 5 pts Registration - Max 3 pts Recall - Max 3 pts Language Repeat - Max 1 pts Language Follow 3 Step Command - Max 3 pts      Mini-Cog - 11/19/16 0856    Normal clock drawing test? yes   How many words correct? 3      MMSE - Mini Mental State Exam 11/19/2016 11/14/2015  Orientation to time 5 5  Orientation to Place 5 5    Registration 3 3  Attention/ Calculation 0 0  Recall 3 3  Language- name 2 objects 0 0  Language- repeat 1 1  Language- follow 3 step command 3 3  Language- read & follow direction 0 0  Write a sentence 0 0  Copy design 0 0  Total score 20 20        Immunization History  Administered Date(s) Administered  . Influenza Split 01/31/2011, 02/11/2012  . Influenza Whole 02/27/2010  . Influenza,inj,Quad PF,36+ Mos 03/18/2013, 03/25/2014, 01/19/2015, 02/21/2016  . Pneumococcal Conjugate-13 10/18/2014  . Pneumococcal Polysaccharide-23 11/14/2011  . Td 07/05/2010   Screening Tests Health Maintenance  Topic Date Due  . COLONOSCOPY  08/21/2012  . INFLUENZA VACCINE  12/04/2016  . HEMOGLOBIN A1C  02/18/2017  . OPHTHALMOLOGY EXAM  04/01/2017  . FOOT EXAM  08/22/2017  . TETANUS/TDAP  07/04/2020  . Hepatitis C Screening  Completed  . PNA vac Low Risk Adult  Completed      Plan:    Follow-up w/ PCP as scheduled.  I have personally reviewed and noted the following in the patient's chart:   . Medical and social history . Use of alcohol, tobacco or illicit drugs  . Current medications and supplements . Functional ability and status . Nutritional status . Physical activity . Advanced directives . List of other physicians . Vitals . Screenings to include cognitive, depression, and falls . Referrals and appointments  In addition, I have reviewed and discussed with patient certain preventive protocols, quality metrics, and best practice recommendations. A written personalized care plan for preventive services as well as general preventive health recommendations were provided to patient.     Dorrene German, RN  11/19/2016

## 2016-11-07 NOTE — Progress Notes (Signed)
PCP notes:   Health maintenance: CCS- overdue. Last report is in Chart Review under "Procedures" tab.   Abnormal screenings: None.   Patient concerns: None.   Nurse concerns: BP elevated, he has not taken medications this morning and states he gets very nervous when his BP is checked.   Next PCP appt: 11/21/2016 @ 9:45am  I reviewed health advisor's note, was available for consultation on the day of service listed in this note, and agree with documentation and plan. Elsie Stain, MD.

## 2016-11-18 ENCOUNTER — Other Ambulatory Visit: Payer: Self-pay | Admitting: Family Medicine

## 2016-11-18 DIAGNOSIS — E1159 Type 2 diabetes mellitus with other circulatory complications: Secondary | ICD-10-CM

## 2016-11-19 ENCOUNTER — Other Ambulatory Visit (INDEPENDENT_AMBULATORY_CARE_PROVIDER_SITE_OTHER): Payer: Medicare Other

## 2016-11-19 ENCOUNTER — Ambulatory Visit: Payer: Medicare Other

## 2016-11-19 VITALS — BP 180/90 | HR 69 | Ht 73.0 in | Wt 187.5 lb

## 2016-11-19 DIAGNOSIS — E1159 Type 2 diabetes mellitus with other circulatory complications: Secondary | ICD-10-CM | POA: Diagnosis not present

## 2016-11-19 DIAGNOSIS — Z0000 Encounter for general adult medical examination without abnormal findings: Secondary | ICD-10-CM

## 2016-11-19 LAB — LIPID PANEL
Cholesterol: 171 mg/dL (ref 0–200)
HDL: 37.4 mg/dL — ABNORMAL LOW (ref >39.00)
NonHDL: 133.4
Total CHOL/HDL Ratio: 5
Triglycerides: 310 mg/dL — ABNORMAL HIGH (ref 0.0–149.0)
VLDL: 62 mg/dL — ABNORMAL HIGH (ref 0.0–40.0)

## 2016-11-19 LAB — COMPREHENSIVE METABOLIC PANEL
ALT: 18 U/L (ref 0–53)
AST: 16 U/L (ref 0–37)
Albumin: 4.2 g/dL (ref 3.5–5.2)
Alkaline Phosphatase: 58 U/L (ref 39–117)
BUN: 31 mg/dL — ABNORMAL HIGH (ref 6–23)
CO2: 29 mEq/L (ref 19–32)
Calcium: 9.9 mg/dL (ref 8.4–10.5)
Chloride: 102 mEq/L (ref 96–112)
Creatinine, Ser: 1.28 mg/dL (ref 0.40–1.50)
GFR: 59.03 mL/min — ABNORMAL LOW (ref >60.00)
Glucose, Bld: 257 mg/dL — ABNORMAL HIGH (ref 70–99)
Potassium: 4.9 mEq/L (ref 3.5–5.1)
Sodium: 136 mEq/L (ref 135–145)
Total Bilirubin: 0.6 mg/dL (ref 0.2–1.2)
Total Protein: 7.1 g/dL (ref 6.0–8.3)

## 2016-11-19 LAB — HEMOGLOBIN A1C: Hgb A1c MFr Bld: 8.2 % — ABNORMAL HIGH (ref 4.6–6.5)

## 2016-11-19 LAB — LDL CHOLESTEROL, DIRECT: Direct LDL: 76 mg/dL

## 2016-11-19 NOTE — Patient Instructions (Signed)
Luke [name] , Thank you for taking time to come for your Medicare Wellness Visit. I appreciate your ongoing commitment to your health goals. Please review the following plan we discussed and let me know if I can assist you in the future.   Bring a copy of your advance directives to your next office visit.  These are the goals we discussed: Goals    . Increase physical activity (pt-stated)          Starting 11/19/2016, I will continue to exercise for at least 1 hr 45 min 5 days per week.        This is a list of the screening recommended for you and due dates:  Health Maintenance  Topic Date Due  . Colon Cancer Screening  08/21/2012  . Flu Shot  12/04/2016  . Hemoglobin A1C  02/18/2017  . Eye exam for diabetics  04/01/2017  . Complete foot exam   08/22/2017  . Tetanus Vaccine  07/04/2020  .  Hepatitis C: One time screening is recommended by Center for Disease Control  (CDC) for  adults born from [birth year] through 1964.   Completed  . Pneumonia vaccines  Completed   Preventive Care for Adults  A healthy lifestyle and preventive care can promote health and wellness. Preventive health guidelines for adults include the following key practices.  . A routine yearly physical is a good way to check with your health care provider about your health and preventive screening. It is a chance to share any concerns and updates on your health and to receive a thorough exam.  . Visit your dentist for a routine exam and preventive care every 6 months. Brush your teeth twice a day and floss once a day. Good oral hygiene prevents tooth decay and gum disease.  . The frequency of eye exams is based on your age, health, family medical history, use  of contact lenses, and other factors. Follow your health care provider's ecommendations for frequency of eye exams.  . Eat a healthy diet. Foods like vegetables, fruits, whole grains, low-fat dairy products, and lean protein foods contain the nutrients you need  without too many calories. Decrease your intake of foods high in solid fats, added sugars, and salt. Eat the right amount of calories for you. Get information about a proper diet from your health care provider, if necessary.  . Regular physical exercise is one of the most important things you can do for your health. Most adults should get at least 150 minutes of moderate-intensity exercise (any activity that increases your heart rate and causes you to sweat) each week. In addition, most adults need muscle-strengthening exercises on 2 or more days a week.  Silver Sneakers may be a benefit available to you. To determine eligibility, you may visit the website: www.silversneakers.com or contact program at (936)295-4097 Mon-Fri between 8AM-8PM.   . Maintain a healthy weight. The body mass index (BMI) is a screening tool to identify possible weight problems. It provides an estimate of body fat based on height and weight. Your health care provider can find your BMI and can help you achieve or maintain a healthy weight.   For adults 20 years and older: ? A BMI below 18.5 is considered underweight. ? A BMI of 18.5 to 24.9 is normal. ? A BMI of 25 to 29.9 is considered overweight. ? A BMI of 30 and above is considered obese.   . Maintain normal blood lipids and cholesterol levels by exercising and minimizing your  intake of saturated fat. Eat a balanced diet with plenty of fruit and vegetables. Blood tests for lipids and cholesterol should begin at age 74 and be repeated every 5 years. If your lipid or cholesterol levels are high, you are over 50, or you are at high risk for heart disease, you may need your cholesterol levels checked more frequently. Ongoing high lipid and cholesterol levels should be treated with medicines if diet and exercise are not working.  . If you smoke, find out from your health care provider how to quit. If you do not use tobacco, please do not start.  . If you choose to drink  alcohol, please do not consume more than 2 drinks per day. One drink is considered to be 12 ounces (355 mL) of beer, 5 ounces (148 mL) of wine, or 1.5 ounces (44 mL) of liquor.  . If you are 23-82 years old, ask your health care provider if you should take aspirin to prevent strokes.  . Use sunscreen. Apply sunscreen liberally and repeatedly throughout the day. You should seek shade when your shadow is shorter than you. Protect yourself by wearing long sleeves, pants, a wide-brimmed hat, and sunglasses year round, whenever you are outdoors.  . Once a month, do a whole body skin exam, using a mirror to look at the skin on your back. Tell your health care provider of new moles, moles that have irregular borders, moles that are larger than a pencil eraser, or moles that have changed in shape or color.

## 2016-11-21 ENCOUNTER — Ambulatory Visit (INDEPENDENT_AMBULATORY_CARE_PROVIDER_SITE_OTHER): Payer: Medicare Other | Admitting: Family Medicine

## 2016-11-21 ENCOUNTER — Encounter: Payer: Self-pay | Admitting: Family Medicine

## 2016-11-21 VITALS — BP 142/84 | HR 62 | Temp 98.5°F | Ht 73.0 in | Wt 187.5 lb

## 2016-11-21 DIAGNOSIS — Z0000 Encounter for general adult medical examination without abnormal findings: Secondary | ICD-10-CM

## 2016-11-21 DIAGNOSIS — I10 Essential (primary) hypertension: Secondary | ICD-10-CM | POA: Diagnosis not present

## 2016-11-21 DIAGNOSIS — I739 Peripheral vascular disease, unspecified: Secondary | ICD-10-CM

## 2016-11-21 DIAGNOSIS — E785 Hyperlipidemia, unspecified: Secondary | ICD-10-CM | POA: Diagnosis not present

## 2016-11-21 DIAGNOSIS — I255 Ischemic cardiomyopathy: Secondary | ICD-10-CM | POA: Diagnosis not present

## 2016-11-21 DIAGNOSIS — S14129S Central cord syndrome at unspecified level of cervical spinal cord, sequela: Secondary | ICD-10-CM | POA: Diagnosis not present

## 2016-11-21 DIAGNOSIS — E1159 Type 2 diabetes mellitus with other circulatory complications: Secondary | ICD-10-CM

## 2016-11-21 MED ORDER — LISINOPRIL 20 MG PO TABS: 20.0000 mg | ORAL_TABLET | Freq: Two times a day (BID) | ORAL | 3 refills | Status: DC

## 2016-11-21 MED ORDER — SIMVASTATIN 40 MG PO TABS: 40.0000 mg | ORAL_TABLET | Freq: Every day | ORAL | 3 refills | Status: DC

## 2016-11-21 MED ORDER — METFORMIN HCL 500 MG PO TABS: ORAL_TABLET | ORAL | 3 refills | Status: DC

## 2016-11-21 MED ORDER — GLIPIZIDE 5 MG PO TABS: 10.0000 mg | ORAL_TABLET | Freq: Two times a day (BID) | ORAL | 3 refills | Status: DC

## 2016-11-21 MED ORDER — AMLODIPINE BESYLATE 2.5 MG PO TABS: 2.5000 mg | ORAL_TABLET | Freq: Every day | ORAL | 3 refills | Status: DC

## 2016-11-21 NOTE — Progress Notes (Signed)
Hypertension:    Using medication without problems or lightheadedness: yes Chest pain with exertion:no Edema:no Short of breath:no Average home BPs: 120s/70s.    Diabetes:  Using medications without difficulties:yes Hypoglycemic episodes:no Hyperglycemic episodes:no Feet problems: at baseline, with some tingling in the hands and feet after the prev neck injury along with DM2 neuropathy.    Blood Sugars averaging: always up later in the AM with dawn effect.  AM prior to 6 AM is 110-120, but much higher after 7AM if still fasting.  eye exam within last year:yes Labs d/w pt. A1c still up, couldn't tolerate januvia with diarrhea.   Sugar has been as low as 80s.   We talked about goal A1c, likely 7.5-8.  Elevated Cholesterol: Using medications without problems:yes Muscle aches: no Diet compliance:yes Exercise:yes  He has vascular f/u pending.  No leg cramping with exercise.  That prev resolved- he is clearly improved. His foot pain is improved with daily sildenafil use.    Colon cancer screening d/w pt. He'll call GI this fall.  D/w pt.   Advance directive- wife designated if patient were incapacitated.   Prostate cancer screening and PSA options (with potential risks and benefits of testing vs not testing) were discussed along with recent recs/guidelines.  He declined testing PSA at this point. Lung cancer screening d/w pt.  See AVS.    Meds, vitals, and allergies reviewed.   PMH and SH reviewed  ROS: Per HPI unless specifically indicated in ROS section   GEN: nad, alert and oriented HEENT: mucous membranes moist NECK: supple w/o LA CV: rrr. PULM: ctab, no inc wob ABD: soft, +bs EXT: no edema SKIN: no acute rash  Diabetic foot exam: Normal inspection No skin breakdown but dry skin noted.  No calluses  Dec but intact DP pulses B Dec sensation to light touch and monofilament Nails thickened

## 2016-11-21 NOTE — Patient Instructions (Addendum)
Call GI about a colonoscopy.  Let me know if you need help getting it set up.  Check with your insurance to see if they will cover the shingrix shot. Check to see if lung cancer screening is covered and if you want to get set up with the program.   I would get a flu shot each fall.   Recheck labs in about 3-4 months.   Take care.  Glad to see you.

## 2016-11-22 DIAGNOSIS — Z0000 Encounter for general adult medical examination without abnormal findings: Secondary | ICD-10-CM | POA: Insufficient documentation

## 2016-11-22 NOTE — Assessment & Plan Note (Signed)
Improved with exercise, diet, current medications for risk factor reduction. No claudication now. His foot pain is improved with sildenafil use daily. Continue as is.

## 2016-11-22 NOTE — Assessment & Plan Note (Signed)
Colon cancer screening d/w pt. He'll call GI this fall.  D/w pt.   Advance directive- wife designated if patient were incapacitated.   Prostate cancer screening and PSA options (with potential risks and benefits of testing vs not testing) were discussed along with recent recs/guidelines.  He declined testing PSA at this point. Lung cancer screening d/w pt.  See AVS.

## 2016-11-22 NOTE — Assessment & Plan Note (Signed)
Always higher at office visit but controlled at home. Continue as is. He does have a whitecoat component. Discussed.

## 2016-11-22 NOTE — Assessment & Plan Note (Signed)
Continue statin. No adverse effect on medication. Able tolerate. Labs discussed with patient. He agrees.

## 2016-11-22 NOTE — Assessment & Plan Note (Signed)
Goal A1c likely 7.5-8, given his age. He likely has significant dawn effect which contributes to his overall A1c elevation. If we put him on enough medication to blunt that elevation, then he would likely have significant hypoglycemia during other parts of the day and this would be both counterproductive and likely risky. He agrees. Continue work on diet and exercise. >25 minutes spent in face to face time with patient, >50% spent in counselling or coordination of care.

## 2016-11-22 NOTE — Assessment & Plan Note (Signed)
He still has symptoms with some paresthesias on the right greater than left extremities but this is clearly improved from his situation after the injury. He is functional. Continue as is.

## 2016-12-02 ENCOUNTER — Encounter: Payer: Self-pay | Admitting: Cardiology

## 2016-12-02 ENCOUNTER — Ambulatory Visit (INDEPENDENT_AMBULATORY_CARE_PROVIDER_SITE_OTHER): Payer: Medicare Other | Admitting: Cardiology

## 2016-12-02 VITALS — BP 140/90 | HR 70 | Ht 74.0 in | Wt 189.4 lb

## 2016-12-02 DIAGNOSIS — I2510 Atherosclerotic heart disease of native coronary artery without angina pectoris: Secondary | ICD-10-CM

## 2016-12-02 DIAGNOSIS — I255 Ischemic cardiomyopathy: Secondary | ICD-10-CM | POA: Diagnosis not present

## 2016-12-02 DIAGNOSIS — I10 Essential (primary) hypertension: Secondary | ICD-10-CM | POA: Diagnosis not present

## 2016-12-02 DIAGNOSIS — I447 Left bundle-branch block, unspecified: Secondary | ICD-10-CM

## 2016-12-02 DIAGNOSIS — I739 Peripheral vascular disease, unspecified: Secondary | ICD-10-CM

## 2016-12-02 DIAGNOSIS — E1159 Type 2 diabetes mellitus with other circulatory complications: Secondary | ICD-10-CM

## 2016-12-02 NOTE — Progress Notes (Signed)
Cardiology Office Note    Date:  12/02/2016   ID:  Luke [name], Luke [name] 1945-07-03, MRN [id number]  PCP:  Luke Ghent, MD  Cardiologist:   Luke Furbish, MD (former Luke [name])    History of Present Illness:  Luke [name] is a 71 y.o. male Luke [name]) who presents today to follow-up coronary disease and cardiomyopathy. EF 30%. Former patient of Dr. Ron [name].  Prior cervical disc surgery. This has been done and he is feeling much better. He still has numbness in his hands which will hopefully slowly improved.   [name] 2013 - 2V CAD Left anterior descending (LAD): focal 70-80% proximal. Long 40% mid vessel. Right coronary artery (RCA): Diffusely diseased. 50-60% mid vessel, 70% distal, 70% at bifurcation of PDA/PLOM.  LV Ejection Fraction: 30%. He underwent catheterization which confirmed severe LV dysfunction with moderate to severe 2 vessel disease not thought to be appropriate for intervention.   He's done quite well since resynchronization therapy and still goes to [employer], exercises on treadmill, no anginal symptoms, peripheral vascular disease seems to be stable, no syncope, no orthopnea, no bleeding.   Past Medical History:  Diagnosis Date  . AAA (abdominal aortic aneurysm) ([place]) 2011   Per vascular surgery  . Anxiety    no med. in use for anxiety but pt. speaks openly of his stress & anxious feelings regarding impending surgery    . Atrial fibrillation ([place])   . Automatic implantable cardioverter-defibrillator in situ   . CAD (coronary artery disease)    Presumed CAD with nuclear scan October 09, 2011,  large anteroseptal MI and inferior MI. Catheterization scheduled October 15, 2011  . Cardiomyopathy [facility])    Nuclear, October 09, 2011, EF 30%, multiple focal wall motion abnormalities  . Diabetes mellitus    type II  . Drug therapy    Hyperkalemia with spironolactone  . Ejection fraction < 50%    EF 30%, nuclear, October 09, 2011  . Ejection fraction < 50%    EF previously 30%  //    EF 30-35%, echo, July 14, 2012, severe diffuse hypokinesis, PA pressure 43 mm mercury  . Fall due to ice or snow Feb.  19, 2015  . HLD (hyperlipidemia)   . Hypertension    white coat HTN-- often elevated in office and controlled on outside checks.  . ICD (implantable cardioverter-defibrillator) in place    CRT-D placed March, 2014 complete heart block and k dysfunction  . LBBB (left bundle branch block)    LBBB on EKG October 11, 2011,  no prior EKG has been done  . Low testosterone    Hx of  . Myocardial infarction ([place])   . Pacemaker   . PAD (peripheral artery disease) ([place])     Past Surgical History:  Procedure Laterality Date  . ABDOMINAL AORTAGRAM N/A 10/28/2011   Procedure: ABDOMINAL [name];  Surgeon: Angelia Mould, MD;  Location: [facility] CATH LAB;  Service: Cardiovascular;  Laterality: N/A;  . ABDOMINAL AORTIC ANEURYSM REPAIR     EVAR   . BI-VENTRICULAR IMPLANTABLE CARDIOVERTER DEFIBRILLATOR N/A 07/16/2012   Procedure: BI-VENTRICULAR IMPLANTABLE CARDIOVERTER DEFIBRILLATOR  (CRT-D);  Surgeon: Deboraha Sprang, MD;  Location: [facility] CATH LAB;  Service: Cardiovascular;  Laterality: N/A;  . CARDIAC CATHETERIZATION    . COLONOSCOPY    . HERNIA REPAIR  Jan. 9, 2015  . INSERTION OF MESH N/A 05/14/2013   Procedure: INSERTION OF MESH;  Surgeon: Adin Hector, MD;  Location: [place];  Service: General;  Laterality: N/A;  . LOWER EXTREMITY ANGIOGRAM Bilateral 10/28/2011   Procedure: LOWER EXTREMITY ANGIOGRAM;  Surgeon: Angelia Mould, MD;  Location: [facility] CATH LAB;  Service: Cardiovascular;  Laterality: Bilateral;  . PACEMAKER INSERTION  07-16-12   pacemaker/defibrilator  . POSTERIOR CERVICAL FUSION/FORAMINOTOMY N/A 06/09/2014   Procedure: Laminectomy - Cervical two-Cervcial four posterior cervical instrumented fusion Cervical two-cervical four;  Surgeon: Eustace Moore, MD;  Location: [place] NEURO ORS;  Service: Neurosurgery;  Laterality: N/A;  posterior   . TONSILLECTOMY     as a child   .  UMBILICAL HERNIA REPAIR N/A 05/14/2013   Procedure: LAPAROSCOPIC exploration and repair of hernia in abdominal ;  Surgeon: Adin Hector, MD;  Location: [place];  Service: General;  Laterality: N/A;    Outpatient Medications Prior to Visit  Medication Sig Dispense Refill  . ACCU-CHEK AVIVA PLUS test strip USE TO CHECK BLOOD SUGAR DAILY. DX: E11.51 100 each 0  . amLODipine (NORVASC) 2.5 MG tablet Take 1 tablet (2.5 mg total) by mouth daily. 90 tablet 3  . aspirin EC 81 MG tablet Take 81 mg by mouth daily.    . carvedilol (COREG) 25 MG tablet TAKE 1 TABLET BY MOUTH TWICE A DAY WITH MEALS 60 tablet 11  . Cholecalciferol (VITAMIN D PO) Take 1,200 Int'l Units by mouth daily.    Marland Kitchen glipiZIDE (GLUCOTROL) 5 MG tablet Take 2 tablets (10 mg total) by mouth 2 (two) times daily before a meal. 360 tablet 3  . lisinopril (PRINIVIL,ZESTRIL) 20 MG tablet Take 1 tablet (20 mg total) by mouth 2 (two) times daily. 180 tablet 3  . metFORMIN (GLUCOPHAGE) 500 MG tablet take 2 tablets by mouth twice a day with food 360 tablet 3  . Multiple Vitamin (MULTIVITAMIN) tablet Take 1 tablet by mouth daily.     . sildenafil (REVATIO) 20 MG tablet TAKE 3 TO 4 TABLETS BY MOUTH AS NEEDED DAILY 50 tablet 11  . simvastatin (ZOCOR) 40 MG tablet Take 1 tablet (40 mg total) by mouth at bedtime. 90 tablet 3  . vitamin B-12 (CYANOCOBALAMIN) 500 MCG tablet Take 500 mcg by mouth daily.     No facility-administered medications prior to visit.      Allergies:   Aldactone [spironolactone]; Codeine; and Januvia [sitagliptin]   Social History   Social History  . Marital status: Married    Spouse name: N/A  . Number of children: N/A  . Years of education: N/A   Social History Main Topics  . Smoking status: Former Smoker    Packs/day: 2.00    Years: 40.00    Types: Cigarettes    Quit date: 05/06/2004  . Smokeless tobacco: Never Used  . Alcohol use 0.0 - 1.2 oz/week     Comment: beer or wine  occassionally  . Drug use: No  .  Sexual activity: Yes   Other Topics Concern  . None   Social History Narrative   [place] vet   Retired   Chiropractor daily.       Family History:  The patient's family history includes Cancer in his father; Diabetes in his sister; Heart attack in his sister; Heart disease in his sister; Hyperlipidemia in his mother and sister; Hypertension in his mother, sister, and son; Stroke in his mother.   ROS:   Please see the history of present illness.   Denies any shortness of breath, chest pain, orthopnea, PND ROS All other systems reviewed and are negative.   PHYSICAL EXAM:   VS:  BP 140/90   Pulse 70   Ht 6\' 2"  (1.88 m)   Wt 189 lb 6.4 oz (85.9 kg)   BMI 24.32 kg/m    GEN: Well nourished, well developed, in no acute distress  HEENT: normal  Neck: no JVD, carotid bruits, or masses Cardiac: RRR; no murmurs, rubs, or gallops,no edema , ICD Respiratory:  clear to auscultation bilaterally, normal work of breathing GI: soft, nontender, nondistended, + BS, Protuberant abdomen MS: no deformity or atrophy  Skin: warm and dry, no rash Neuro:  Alert and Oriented x 3, Strength and sensation are intact Psych: euthymic mood, full affect   Wt Readings from Last 3 Encounters:  12/02/16 189 lb 6.4 oz (85.9 kg)  11/21/16 187 lb 8 oz (85 kg)  11/19/16 187 lb 8 oz (85 kg)      Studies/Labs Reviewed:   EKG:  EKG is ordered today.  The ekg ordered today demonstrates 05/25/15-sinus rhythm, 66, biventricular pacing spikes noted heart rate 66 bpm. Her smoking reviewed.  Echocardiogram 07/15/12  - EF 30%  Recent Labs: 11/19/2016: ALT 18; BUN 31; Creatinine, Ser 1.28; Potassium 4.9; Sodium 136   Lipid Panel    Component Value Date/Time   CHOL 171 11/19/2016 0919   TRIG 310.0 (H) 11/19/2016 0919   HDL 37.40 (L) 11/19/2016 0919   CHOLHDL 5 11/19/2016 0919   VLDL 62.0 (H) 11/19/2016 0919   LDLCALC 53 09/13/2013 0847   LDLDIRECT 76.0 11/19/2016 0919    Additional studies/ records that were  reviewed today include:  Prior office notes, lab, echocardiogram, cardiac catheterization, lab work reviewed    ASSESSMENT:    1. LBBB (left bundle branch block)   2. Coronary artery disease involving native coronary artery of native heart without angina pectoris   3. PVD (peripheral vascular disease) ([place])   4. Essential hypertension, benign   5. Type 2 diabetes mellitus with vascular disease (HCC)      PLAN:  In order of problems listed above:  Ischemic cardiomyopathy/left bundle-branch block  - Underwent CRT/ICD [place] implantation for intermittent complete heart block in the setting of ischemic cardiomyopathy and left bundle branch block. Stable without any exertional symptoms, disease on cardiac catheterization reviewed. We did discuss previously why he did not undergo revascularization.    - Previously had not tolerated spironolactone because of hyperkalemia.  - Continuing with carvedilol at goal dosing. Continue lisinopril at goal dosing.  Coronary artery disease  - Prior cardiac catheterization demonstrated focal 80% proximal LAD lesion, diffusely diseased RCA 60% mid vessel and 70% PDA. EF was 30%. No intervention was performed.   Peripheral vascular disease  - He is not expressing any significant claudication during exercise. He is able to walk on the treadmill for 4 miles a day. He goes to [employer]. Vascular has been monitoring. His left leg has improved. Doing well, stable.  Essential hypertension  - On beta blocker as well as ACE inhibitor, at goal dosing. Stable.  - Sometimes his blood pressure to be elevated when he is at the doctor's office  Diabetes with PVD  - per primary team. Vascular team has been monitoring him. Continue with secondary prevention. Thankfully, his left lower extremity is much improved.  - HgA1c 8.2  Abdominal aortic aneurysm repair  - 2013. He still visiting with vascular team.  Medication Adjustments/Labs and Tests  Ordered: Current medicines are reviewed at length with the patient today.  Concerns regarding medicines are outlined above.  Medication changes, Labs and Tests ordered today  are listed in the Patient Instructions below. Patient Instructions  Medication Instructions:  The current medical regimen is effective;  continue present plan and medications.  Follow-Up: Follow up in 1 year with Dr. Marlou Porch.  You will receive a letter in the mail 2 months before you are due.  Please call us when you receive this letter to schedule your follow up appointment.  If you need a refill on your cardiac medications before your next appointment, please call your pharmacy.  Thank you for choosing [facility]!!          Signed, Luke Furbish, MD  12/02/2016 10:01 AM    [facility] [place], [place], [place]  [address] Phone: [phone]; Fax: [phone]

## 2016-12-02 NOTE — Patient Instructions (Signed)

## 2016-12-31 ENCOUNTER — Ambulatory Visit (INDEPENDENT_AMBULATORY_CARE_PROVIDER_SITE_OTHER): Payer: Medicare Other | Admitting: *Deleted

## 2016-12-31 DIAGNOSIS — I5022 Chronic systolic (congestive) heart failure: Secondary | ICD-10-CM

## 2016-12-31 DIAGNOSIS — I255 Ischemic cardiomyopathy: Secondary | ICD-10-CM | POA: Diagnosis not present

## 2016-12-31 NOTE — Progress Notes (Signed)
Remote ICD transmission.   

## 2017-01-10 ENCOUNTER — Encounter: Payer: Self-pay | Admitting: Cardiology

## 2017-01-15 ENCOUNTER — Other Ambulatory Visit: Payer: Self-pay | Admitting: Family Medicine

## 2017-01-17 LAB — CUP PACEART REMOTE DEVICE CHECK
Date Time Interrogation Session: 20180914110120
Implantable Lead Implant Date: 20140313
Implantable Lead Implant Date: 20140313
Implantable Lead Implant Date: 20140313
Implantable Lead Location: 753858
Implantable Lead Location: 753859
Implantable Lead Location: 753860
Implantable Pulse Generator Implant Date: 20140313
Pulse Gen Serial Number: 7097007

## 2017-02-05 ENCOUNTER — Ambulatory Visit (INDEPENDENT_AMBULATORY_CARE_PROVIDER_SITE_OTHER)
Admission: RE | Admit: 2017-02-05 | Discharge: 2017-02-05 | Disposition: A | Discharge status: Home / Self Care | Payer: Medicare Other | Source: Ambulatory Visit | Attending: Vascular Surgery | Admitting: Vascular Surgery

## 2017-02-05 ENCOUNTER — Ambulatory Visit (HOSPITAL_COMMUNITY)
Admission: RE | Admit: 2017-02-05 | Discharge: 2017-02-05 | Disposition: A | Discharge status: Home / Self Care | Payer: Medicare Other | Source: Ambulatory Visit | Attending: Vascular Surgery | Admitting: Vascular Surgery

## 2017-02-05 ENCOUNTER — Encounter: Payer: Self-pay | Admitting: Family

## 2017-02-05 ENCOUNTER — Ambulatory Visit (INDEPENDENT_AMBULATORY_CARE_PROVIDER_SITE_OTHER): Payer: Medicare Other | Admitting: Family

## 2017-02-05 VITALS — BP 159/74 | HR 62 | Temp 97.1°F | Resp 16 | Ht 74.0 in | Wt 187.0 lb

## 2017-02-05 DIAGNOSIS — Z87891 Personal history of nicotine dependence: Secondary | ICD-10-CM | POA: Diagnosis not present

## 2017-02-05 DIAGNOSIS — Z95828 Presence of other vascular implants and grafts: Secondary | ICD-10-CM | POA: Diagnosis not present

## 2017-02-05 DIAGNOSIS — R9439 Abnormal result of other cardiovascular function study: Secondary | ICD-10-CM | POA: Diagnosis not present

## 2017-02-05 DIAGNOSIS — I714 Abdominal aortic aneurysm, without rupture, unspecified: Secondary | ICD-10-CM

## 2017-02-05 DIAGNOSIS — I255 Ischemic cardiomyopathy: Secondary | ICD-10-CM

## 2017-02-05 DIAGNOSIS — I70211 Atherosclerosis of native arteries of extremities with intermittent claudication, right leg: Secondary | ICD-10-CM

## 2017-02-05 NOTE — Progress Notes (Signed)
[facility] [place]  CC: Follow up s/p Endovascular Repair of Abdominal Aortic Aneurysm    History of Present Illness  Luke [name] is a 71 y.o. (July 07, 1945) male patient of Dr. Scot Dock who is s/p EVAR of a 6 cm infrarenal abdominal aortic aneurysm on 12/03/2011 and returns for routine follow up.   Pt states his blood pressure is usually 120's/70's, he checks it at home.He states that his blood pressure is always high in a medical facility.  He had c-spine surgery in February 2016, he has obtained considerable relief of his symptoms.  He denies back pain from this or any other source, also denies abdominal pain. He exercises at a gym at least 5 days/week and walks a great deal.  He fell on ice in 2015, injured his c-spine and lumbar spine, had surgery on his c-spine, states he has had limited sensation in his hands and feet since then.   He takes a daily statin, ASA, and a beta blocker. He denies any history of stroke or TIA. He did have a silent MI, now has a pacemaker and defibrillator. He has peripheral neuropathy.  He has mild claudication in his right calf and none in his left leg with walking; right calf claudication starts after 6 minutes walking, and resolves after 9 minutes as he continues to walk, denies non healing wounds.   Pt Diabetic: Yes,  last A1C was 8.2 on 11-19-16 (review of records) uncontrolled Pt smoker: former smoker, quit in 2006, smoked for 50 years     Past Medical History:  Diagnosis Date  . AAA (abdominal aortic aneurysm) ([place]) 2011   Per vascular surgery  . Anxiety    no med. in use for anxiety but pt. speaks openly of his stress & anxious feelings regarding impending surgery    . Atrial fibrillation ([place])   . Automatic implantable cardioverter-defibrillator in situ   . CAD (coronary artery disease)    Presumed CAD with nuclear scan October 09, 2011,  large anteroseptal MI and inferior MI. Catheterization scheduled October 15, 2011  . Cardiomyopathy [facility])    Nuclear, October 09, 2011, EF 30%, multiple focal wall motion abnormalities  . Diabetes mellitus    type II  . Drug therapy    Hyperkalemia with spironolactone  . Ejection fraction < 50%    EF 30%, nuclear, October 09, 2011  . Ejection fraction < 50%    EF previously 30%  //   EF 30-35%, echo, July 14, 2012, severe diffuse hypokinesis, PA pressure 43 mm mercury  . Fall due to ice or snow Feb.  19, 2015  . HLD (hyperlipidemia)   . Hypertension    white coat HTN-- often elevated in office and controlled on outside checks.  . ICD (implantable cardioverter-defibrillator) in place    CRT-D placed March, 2014 complete heart block and k dysfunction  . LBBB (left bundle branch block)    LBBB on EKG October 11, 2011,  no prior EKG has been done  . Low testosterone    Hx of  . Myocardial infarction ([place])   . Pacemaker   . PAD (peripheral artery disease) ([place])    Past Surgical History:  Procedure Laterality Date  . ABDOMINAL AORTAGRAM N/A 10/28/2011   Procedure: ABDOMINAL [name];  Surgeon: Angelia Mould, MD;  Location[facility] C[facility] LAB;  Service: Cardiovascular;  Laterality: N/A;  . ABDOMINAL AORTIC ANEURYSM REPAIR     EVAR   . BI-VENTRICULAR IMPLANTABLE CARDIOVERTER DEFIBRILLATOR N/A  07/16/2012   Procedure: BI-VENTRICULAR IMPLANTABLE CARDIOVERTER DEFIBRILLATOR  (CRT-D);  Surgeon: Deboraha Sprang, MD;  Location: [facility] CATH LAB;  Service: Cardiovascular;  Laterality: N/A;  . CARDIAC CATHETERIZATION    . COLONOSCOPY    . HERNIA REPAIR  Jan. 9, 2015  . INSERTION OF MESH N/A 05/14/2013   Procedure: INSERTION OF MESH;  Surgeon: Adin Hector, MD;  Location: [place];  Service: General;  Laterality: N/A;  . LOWER EXTREMITY ANGIOGRAM Bilateral 10/28/2011   Procedure: LOWER EXTREMITY ANGIOGRAM;  Surgeon: Angelia Mould, MD;  Location: [facility] CATH LAB;  Service: Cardiovascular;  Laterality: Bilateral;  . PACEMAKER INSERTION  07-16-12   pacemaker/defibrilator  . POSTERIOR  CERVICAL FUSION/FORAMINOTOMY N/A 06/09/2014   Procedure: Laminectomy - Cervical two-Cervcial four posterior cervical instrumented fusion Cervical two-cervical four;  Surgeon: Eustace Moore, MD;  Location: [place] NEURO ORS;  Service: Neurosurgery;  Laterality: N/A;  posterior   . TONSILLECTOMY     as a child   . UMBILICAL HERNIA REPAIR N/A 05/14/2013   Procedure: LAPAROSCOPIC exploration and repair of hernia in abdominal ;  Surgeon: Adin Hector, MD;  Location: [place] OR;  Service: General;  Laterality: N/A;   Social History Social History  Substance Use Topics  . Smoking status: Former Smoker    Packs/day: 2.00    Years: 40.00    Types: Cigarettes    Quit date: 05/06/2004  . Smokeless tobacco: Never Used  . Alcohol use 0.0 - 1.2 oz/week     Comment: beer or wine  occassionally   Family History Family History  Problem Relation Age of Onset  . Hypertension Mother   . Stroke Mother   . Hyperlipidemia Mother   . Diabetes Sister   . Heart disease Sister        Before age 68  . Hypertension Sister   . Hyperlipidemia Sister   . Heart attack Sister   . Cancer Father        Lung  . Hypertension Son   . Colon cancer Neg Hx   . Prostate cancer Neg Hx    Current Outpatient Prescriptions on File Prior to Visit  Medication Sig Dispense Refill  . ACCU-CHEK AVIVA PLUS test strip USE TO CHECK BLOOD SUGAR DAILY 100 each 1  . amLODipine (NORVASC) 2.5 MG tablet Take 1 tablet (2.5 mg total) by mouth daily. 90 tablet 3  . aspirin EC 81 MG tablet Take 81 mg by mouth daily.    . carvedilol (COREG) 25 MG tablet TAKE 1 TABLET BY MOUTH TWICE A DAY WITH MEALS 60 tablet 11  . Cholecalciferol (VITAMIN D PO) Take 1,200 Int'l Units by mouth daily.    Marland Kitchen glipiZIDE (GLUCOTROL) 5 MG tablet Take 2 tablets (10 mg total) by mouth 2 (two) times daily before a meal. 360 tablet 3  . lisinopril (PRINIVIL,ZESTRIL) 20 MG tablet Take 1 tablet (20 mg total) by mouth 2 (two) times daily. 180 tablet 3  . metFORMIN (GLUCOPHAGE)  500 MG tablet take 2 tablets by mouth twice a day with food 360 tablet 3  . Multiple Vitamin (MULTIVITAMIN) tablet Take 1 tablet by mouth daily.     . sildenafil (REVATIO) 20 MG tablet TAKE 3 TO 4 TABLETS BY MOUTH AS NEEDED DAILY 50 tablet 11  . simvastatin (ZOCOR) 40 MG tablet Take 1 tablet (40 mg total) by mouth at bedtime. 90 tablet 3  . vitamin B-12 (CYANOCOBALAMIN) 500 MCG tablet Take 500 mcg by mouth daily.     No  current facility-administered medications on file prior to visit.    Allergies  Allergen Reactions  . Aldactone [Spironolactone] Other (See Comments)    Hyperkalemia  . Codeine Rash and Hives  . Januvia [Sitagliptin] Other (See Comments)    Diarrhea and heart racing     ROS: See HPI for pertinent positives and negatives.  Physical Examination  Vitals:   02/05/17 0954 02/05/17 0959  BP: (!) 161/82 (!) 159/74  Pulse: 62   Resp: 16   Temp: (!) 97.1 F (36.2 C)   TempSrc: Oral   SpO2: 96%   Weight: 187 lb (84.8 kg)   Height: 6\' 2"  (1.88 m)    Body mass index is 24.01 kg/m.  General: &O x 3, WD, tall male.  Pulmonary: Sym exp, respirations are non labored, good air movt, CTAB, no rales, rhonchi, or wheezing.   Cardiac: RRR, Nl S1, S2, no murmur appreciated, left side chest pacemaker/defibrillaor palpated subcutaneously.  Vascular: Vessel Right Left  Radial 2+Palpable 2+Palpable  Carotid without bruit without bruit  Aorta Not palpable N/A  Femoral 2+Palpable 2+Palpable  Popliteal Not palpable Not palpable  PT Not Palpable 2+Palpable  DP Not Palpable Not Palpable   Gastrointestinal: soft, NTND, -G/R, - HSM, - palpable masses, - CVAT B.  Musculoskeletal: M/S 5/5 throughout, Extremities without ischemic changes.  Neurologic: CN 2-12 intact, Motor exam as listed above. Limited sensation to touch in his feet.    Non-Invasive Vascular Imaging  EVAR Duplex (Date: 02-05-17)  AAA sac size: 4.04 cm; unable to visualize  bilateral common iliac arteries. Limited visualization of the abdominal vasculature due to overlying bowel gas.   no endoleak detected  01-30-17: 4.0 cm  CTA Abd/Pelvis Duplex (Date: 07/01/12) Mild atheromatous plaque and nonocclusive eccentric mural thrombus in the visualized distal descending thoracic and suprarenal abdominal aorta. Patent bifurcated infrarenal aortic stent graft without endoleak. Native aneurysm sac diameter 5.5 x 6.4 cm, previously 5.9 x 6.6   ABI (Date: 02/05/2017):  R:   ABI: 0.59 (was 0.59 on 01-18-15),   PT: waveforms not documented  DP: waveforms not documented  TBI:  0.52 (was 0.55)  L:   ABI: 0.96 (was 1.06),   PT: waveforms not documented  DP: waveforms not documented  TBI: 0.62 (was 0.77)  Stable bilateral ABI: moderate disease on the right, normal on the left. Waveforms not documented today, but were monophasic on the right and bi and triphasic on the left on 01-18-15.     Medical Decision Making  SAHAN PEN is a 71 y.o. male who presents s/p EVAR (Date: 12/03/2011).  Pt is asymptomatic with stable sac size.  I discussed with the patient the importance of surveillance of the endograft.    PAOD - He has mild claudication in his right calf which resolves with continued walking, no claudication in his left leg.  His atherosclerotic risk factors include uncontrolled DM, former smoker, and CAD.  He takes a daily statin and ASA.  He walks and exercises a great deal.   The next endograft duplex will be scheduled for 12 months, will also check ABI's.  I advised pt to notify us if he develops concerns re the circulation in his feet or legs.    I emphasized the importance of maximal medical management including strict control of blood pressure, blood glucose, and lipid levels, antiplatelet agents, obtaining regular exercise, and cessation of smoking.   Thank you for allowing [place] to participate in this patient's  care.  Clemon Chambers, RN, MSN,  FNP-C [facility] [place] Office: [facility] Physician: Scot Dock  02/05/2017, 10:24 AM

## 2017-02-05 NOTE — Patient Instructions (Addendum)
Before your next abdominal ultrasound:  Take two Extra-Strength Gas-X capsules at bedtime the night before the test. Take another two Extra-Strength Gas-X capsules 3 hours before the test.  Avoid gas forming foods the day before the test.       Peripheral Vascular Disease Peripheral vascular disease (PVD) is a disease of the blood vessels that are not part of your heart and brain. A simple term for PVD is poor circulation. In most cases, PVD narrows the blood vessels that carry blood from your heart to the rest of your body. This can result in a decreased supply of blood to your arms, legs, and internal organs, like your stomach or kidneys. However, it most often affects a person's lower legs and feet. There are two types of PVD.  Organic PVD. This is the more common type. It is caused by damage to the structure of blood vessels.  Functional PVD. This is caused by conditions that make blood vessels contract and tighten (spasm).  Without treatment, PVD tends to get worse over time. PVD can also lead to acute ischemic limb. This is when an arm or limb suddenly has trouble getting enough blood. This is a medical emergency. Follow these instructions at home:  Take medicines only as told by your doctor.  Do not use any tobacco products, including cigarettes, chewing tobacco, or electronic cigarettes. If you need help quitting, ask your doctor.  Lose weight if you are overweight, and maintain a healthy weight as told by your doctor.  Eat a diet that is low in fat and cholesterol. If you need help, ask your doctor.  Exercise regularly. Ask your doctor for some good activities for you.  Take good care of your feet. ? Wear comfortable shoes that fit well. ? Check your feet often for any cuts or sores. Contact a doctor if:  You have cramps in your legs while walking.  You have leg pain when you are at rest.  You have coldness in a leg or foot.  Your skin changes.  You are unable to  get or have an erection (erectile dysfunction).  You have cuts or sores on your feet that are not healing. Get help right away if:  Your arm or leg turns cold and blue.  Your arms or legs become red, warm, swollen, painful, or numb.  You have chest pain or trouble breathing.  You suddenly have weakness in your face, arm, or leg.  You become very confused or you cannot speak.  You suddenly have a very bad headache.  You suddenly cannot see. This information is not intended to replace advice given to you by your health care provider. Make sure you discuss any questions you have with your health care provider. Document Released: 07/17/2009 Document Revised: 09/28/2015 Document Reviewed: 09/30/2013 Elsevier Interactive Patient Education  2017 Elsevier Inc.  

## 2017-02-07 NOTE — Addendum Note (Signed)
Addended by: Lianne Cure A on: 02/07/2017 11:00 AM   Modules accepted: Orders

## 2017-02-23 ENCOUNTER — Other Ambulatory Visit: Payer: Self-pay | Admitting: Family Medicine

## 2017-02-23 DIAGNOSIS — E1159 Type 2 diabetes mellitus with other circulatory complications: Secondary | ICD-10-CM

## 2017-02-25 ENCOUNTER — Other Ambulatory Visit (INDEPENDENT_AMBULATORY_CARE_PROVIDER_SITE_OTHER): Payer: Medicare Other

## 2017-02-25 DIAGNOSIS — E1159 Type 2 diabetes mellitus with other circulatory complications: Secondary | ICD-10-CM | POA: Diagnosis not present

## 2017-02-25 LAB — HEMOGLOBIN A1C: Hgb A1c MFr Bld: 8.4 % — ABNORMAL HIGH (ref 4.6–6.5)

## 2017-02-27 ENCOUNTER — Encounter: Payer: Self-pay | Admitting: Family Medicine

## 2017-02-27 ENCOUNTER — Ambulatory Visit (INDEPENDENT_AMBULATORY_CARE_PROVIDER_SITE_OTHER): Payer: Medicare Other | Admitting: Family Medicine

## 2017-02-27 VITALS — BP 122/80 | HR 73 | Temp 97.7°F | Wt 188.5 lb

## 2017-02-27 DIAGNOSIS — E1159 Type 2 diabetes mellitus with other circulatory complications: Secondary | ICD-10-CM | POA: Diagnosis not present

## 2017-02-27 DIAGNOSIS — I255 Ischemic cardiomyopathy: Secondary | ICD-10-CM

## 2017-02-27 DIAGNOSIS — Z23 Encounter for immunization: Secondary | ICD-10-CM | POA: Diagnosis not present

## 2017-02-27 NOTE — Patient Instructions (Signed)
Call GI about a follow up appointment.   Take care.  Glad to see you.  Let me consider some options in the meantime re: DM2 meds.   Plan on recheck in about 3-4 months.

## 2017-02-27 NOTE — Progress Notes (Signed)
Diabetes:  Using medications without difficulties:yes Hypoglycemic episodes:no Hyperglycemic episodes:no Feet problems:tingling at baseline.  Blood Sugars averaging: usually ~ 85-145 at night, higher in the AM if he checks after 6AM.  eye exam within last year: yes a1c not at goal, in spite of diet and exercise.    Meds, vitals, and allergies reviewed.   ROS: Per HPI unless specifically indicated in ROS section   GEN: nad, alert and oriented HEENT: mucous membranes moist NECK: supple w/o LA CV: rrr. PULM: ctab, no inc wob ABD: soft, +bs EXT: no edema SKIN: no acute rash

## 2017-02-28 NOTE — Assessment & Plan Note (Signed)
We talked about options. He has been working on diet and exercise consistently. In spite of that his A1c is still above 8. I want to consider options regarding his medication and then we will get in touch with the patient. He agrees with that. Plan to recheck in 3 months. We will be in contact with patient in the meantime. He agrees.

## 2017-03-17 ENCOUNTER — Telehealth: Payer: Self-pay | Admitting: Family Medicine

## 2017-03-17 NOTE — Telephone Encounter (Signed)
Late entry.  Had called patient last week and talk to him about options.  There were significant cautions with other oral medications for diabetes.  He was not enthused about starting insulin at this point.  If he were going to start a medicine in addition to his current meds, long-acting insulin would most likely be the safest option.  However given his age and other comorbidities, it is reasonable to try to avoid hypoglycemia.  We will continue as is at this point, not add another medicine such as insulin, and recheck periodically.  He agrees.

## 2017-03-26 ENCOUNTER — Telehealth: Payer: Self-pay | Admitting: Cardiology

## 2017-03-26 NOTE — Telephone Encounter (Signed)
LMOVM requesting that pt send manual transmission b/c home monitor has not updated in at least 7 days.    

## 2017-04-01 ENCOUNTER — Ambulatory Visit (INDEPENDENT_AMBULATORY_CARE_PROVIDER_SITE_OTHER): Payer: Medicare Other | Admitting: *Deleted

## 2017-04-01 DIAGNOSIS — I255 Ischemic cardiomyopathy: Secondary | ICD-10-CM | POA: Diagnosis not present

## 2017-04-01 DIAGNOSIS — I5022 Chronic systolic (congestive) heart failure: Secondary | ICD-10-CM

## 2017-04-01 NOTE — Progress Notes (Signed)
Remote ICD transmission.   

## 2017-04-04 ENCOUNTER — Encounter: Payer: Self-pay | Admitting: Cardiology

## 2017-04-14 LAB — CUP PACEART REMOTE DEVICE CHECK
Battery Remaining Longevity: 36 mo
Battery Remaining Percentage: 41 %
Battery Voltage: 2.92 V
Brady Statistic AP VP Percent: 21 %
Brady Statistic AP VS Percent: 1 %
Brady Statistic AS VP Percent: 79 %
Brady Statistic AS VS Percent: 1 %
Brady Statistic RA Percent Paced: 21 %
Date Time Interrogation Session: 20181127162839
HighPow Impedance: 74 Ohm
HighPow Impedance: 74 Ohm
Implantable Lead Implant Date: 20140313
Implantable Lead Implant Date: 20140313
Implantable Lead Implant Date: 20140313
Implantable Lead Location: 753858
Implantable Lead Location: 753859
Implantable Lead Location: 753860
Implantable Pulse Generator Implant Date: 20140313
Lead Channel Impedance Value: 450 Ohm
Lead Channel Impedance Value: 460 Ohm
Lead Channel Impedance Value: 990 Ohm
Lead Channel Pacing Threshold Amplitude: 0.75 V
Lead Channel Pacing Threshold Amplitude: 0.75 V
Lead Channel Pacing Threshold Amplitude: 1.5 V
Lead Channel Pacing Threshold Pulse Width: 0.5 ms
Lead Channel Pacing Threshold Pulse Width: 0.5 ms
Lead Channel Pacing Threshold Pulse Width: 0.6 ms
Lead Channel Sensing Intrinsic Amplitude: 4.1 mV
Lead Channel Sensing Intrinsic Amplitude: 7.9 mV
Lead Channel Setting Pacing Amplitude: 2 V
Lead Channel Setting Pacing Amplitude: 2 V
Lead Channel Setting Pacing Amplitude: 2.5 V
Lead Channel Setting Pacing Pulse Width: 0.5 ms
Lead Channel Setting Pacing Pulse Width: 0.6 ms
Lead Channel Setting Sensing Sensitivity: 0.5 mV
Pulse Gen Serial Number: 7097007

## 2017-05-08 DIAGNOSIS — H35032 Hypertensive retinopathy, left eye: Secondary | ICD-10-CM | POA: Diagnosis not present

## 2017-05-08 DIAGNOSIS — H35031 Hypertensive retinopathy, right eye: Secondary | ICD-10-CM | POA: Diagnosis not present

## 2017-05-08 DIAGNOSIS — H35033 Hypertensive retinopathy, bilateral: Secondary | ICD-10-CM | POA: Diagnosis not present

## 2017-05-08 DIAGNOSIS — H11153 Pinguecula, bilateral: Secondary | ICD-10-CM | POA: Diagnosis not present

## 2017-05-08 DIAGNOSIS — I10 Essential (primary) hypertension: Secondary | ICD-10-CM | POA: Diagnosis not present

## 2017-05-08 DIAGNOSIS — H25013 Cortical age-related cataract, bilateral: Secondary | ICD-10-CM | POA: Diagnosis not present

## 2017-05-08 DIAGNOSIS — H2142 Pupillary membranes, left eye: Secondary | ICD-10-CM | POA: Diagnosis not present

## 2017-05-08 DIAGNOSIS — E119 Type 2 diabetes mellitus without complications: Secondary | ICD-10-CM | POA: Diagnosis not present

## 2017-05-08 DIAGNOSIS — H18413 Arcus senilis, bilateral: Secondary | ICD-10-CM | POA: Diagnosis not present

## 2017-05-08 DIAGNOSIS — H2513 Age-related nuclear cataract, bilateral: Secondary | ICD-10-CM | POA: Diagnosis not present

## 2017-05-16 ENCOUNTER — Other Ambulatory Visit: Payer: Self-pay

## 2017-05-16 DIAGNOSIS — I6529 Occlusion and stenosis of unspecified carotid artery: Secondary | ICD-10-CM

## 2017-05-23 ENCOUNTER — Ambulatory Visit (HOSPITAL_COMMUNITY)
Admission: RE | Admit: 2017-05-23 | Discharge: 2017-05-23 | Disposition: A | Discharge status: Home / Self Care | Payer: Medicare Other | Source: Ambulatory Visit | Attending: Vascular Surgery | Admitting: Vascular Surgery

## 2017-05-23 DIAGNOSIS — E785 Hyperlipidemia, unspecified: Secondary | ICD-10-CM | POA: Diagnosis not present

## 2017-05-23 DIAGNOSIS — I6523 Occlusion and stenosis of bilateral carotid arteries: Secondary | ICD-10-CM | POA: Diagnosis not present

## 2017-05-23 DIAGNOSIS — I2510 Atherosclerotic heart disease of native coronary artery without angina pectoris: Secondary | ICD-10-CM | POA: Diagnosis not present

## 2017-05-23 DIAGNOSIS — I6529 Occlusion and stenosis of unspecified carotid artery: Secondary | ICD-10-CM | POA: Diagnosis not present

## 2017-05-23 DIAGNOSIS — I10 Essential (primary) hypertension: Secondary | ICD-10-CM | POA: Insufficient documentation

## 2017-05-23 DIAGNOSIS — E1151 Type 2 diabetes mellitus with diabetic peripheral angiopathy without gangrene: Secondary | ICD-10-CM | POA: Diagnosis not present

## 2017-05-26 LAB — VAS US CAROTID
LEFT ECA DIAS: -19 cm/s
LEFT VERTEBRAL DIAS: 13 cm/s
Left CCA dist dias: -23 cm/s
Left CCA dist sys: -73 cm/s
Left CCA prox dias: 15 cm/s
Left CCA prox sys: 80 cm/s
Left ICA dist dias: -27 cm/s
Left ICA dist sys: -86 cm/s
Left ICA prox dias: 19 cm/s
Left ICA prox sys: 66 cm/s
RIGHT CCA MID DIAS: -17 cm/s
RIGHT ECA DIAS: -21 cm/s
RIGHT VERTEBRAL DIAS: 11 cm/s
Right CCA prox dias: 15 cm/s
Right CCA prox sys: 74 cm/s
Right cca dist sys: -105 cm/s

## 2017-05-27 ENCOUNTER — Encounter: Payer: Self-pay | Admitting: Vascular Surgery

## 2017-05-27 ENCOUNTER — Ambulatory Visit (INDEPENDENT_AMBULATORY_CARE_PROVIDER_SITE_OTHER): Payer: Medicare Other | Admitting: Vascular Surgery

## 2017-05-27 VITALS — BP 189/88 | HR 70 | Resp 20 | Ht 74.0 in | Wt 187.0 lb

## 2017-05-27 DIAGNOSIS — I6523 Occlusion and stenosis of bilateral carotid arteries: Secondary | ICD-10-CM | POA: Diagnosis not present

## 2017-05-27 NOTE — Progress Notes (Signed)
Patient name: Luke [name] MRN: [id number] DOB: 01/28/1946 Sex: male   REASON FOR CONSULT:    Evaluate for carotid disease.  The consult is requested by Dr. Parke Simmers  HPI:   Luke [name] is a pleasant 72 y.o. male, who I last saw in August 2014 for follow-up after his endovascular aneurysm repair.  He underwent endovascular aneurysm repair of a 6 cm infrarenal abdominal aortic aneurysm in July 2013.  At the time of his 11-month follow-up visit, the aneurysm had decreased in size to 5 cm.  There was no evidence of endoleak.  The patient has been followed by our nurse practitioner.  He was last seen in October 2018.  At that time the aneurysm was 4.0 cm in maximum diameter.  He is followed on a yearly basis.  The patient was seen by Dr. Parke Simmers on 05/08/2017.  At that time, peripheral retinal exam revealed mid peripheral dot and blot hemorrhages scattered throughout the retina.  This was on the right side I believe.  The left eye was clear.  He noted that unilateral mid peripheral hemorrhages can be associated with carotid disease among other etiologies and therefore he was sent for carotid evaluation.  Patient is right-handed.  He denies any history of stroke, TIAs, expressive or receptive aphasia, or amaurosis fugax.  He is on aspirin and is on a statin.  Past Medical History:  Diagnosis Date  . AAA (abdominal aortic aneurysm) ([place]) 2011   Per vascular surgery  . Anxiety    no med. in use for anxiety but pt. speaks openly of his stress & anxious feelings regarding impending surgery    . Atrial fibrillation ([place])   . Automatic implantable cardioverter-defibrillator in situ   . CAD (coronary artery disease)    Presumed CAD with nuclear scan October 09, 2011,  large anteroseptal MI and inferior MI. Catheterization scheduled October 15, 2011  . Cardiomyopathy [facility])    Nuclear, October 09, 2011, EF 30%, multiple focal wall motion abnormalities  . Diabetes mellitus    type II  . Drug therapy    Hyperkalemia with spironolactone  . Ejection fraction < 50%    EF 30%, nuclear, October 09, 2011  . Ejection fraction < 50%    EF previously 30%  //   EF 30-35%, echo, July 14, 2012, severe diffuse hypokinesis, PA pressure 43 mm mercury  . Fall due to ice or snow Feb.  19, 2015  . HLD (hyperlipidemia)   . Hypertension    white coat HTN-- often elevated in office and controlled on outside checks.  . ICD (implantable cardioverter-defibrillator) in place    CRT-D placed March, 2014 complete heart block and k dysfunction  . LBBB (left bundle branch block)    LBBB on EKG October 11, 2011,  no prior EKG has been done  . Low testosterone    Hx of  . Myocardial infarction ([place])   . Pacemaker   . PAD (peripheral artery disease) ([facility])     Family History  Problem Relation Age of Onset  . Hypertension Mother   . Stroke Mother   . Hyperlipidemia Mother   . Diabetes Sister   . Heart disease Sister        Before age 20  . Hypertension Sister   . Hyperlipidemia Sister   . Heart attack Sister   . Cancer Father        Lung  . Hypertension Son   . Colon cancer Neg Hx   .  Prostate cancer Neg Hx     SOCIAL HISTORY: Social History   Socioeconomic History  . Marital status: Married    Spouse name: Not on file  . Number of children: Not on file  . Years of education: Not on file  . Highest education level: Not on file  Social Needs  . Financial resource strain: Not on file  . Food insecurity - worry: Not on file  . Food insecurity - inability: Not on file  . Transportation needs - medical: Not on file  . Transportation needs - non-medical: Not on file  Occupational History  . Not on file  Tobacco Use  . Smoking status: Former Smoker    Packs/day: 2.00    Years: 40.00    Pack years: 80.00    Types: Cigarettes    Last attempt to quit: 05/06/2004    Years since quitting: 13.0  . Smokeless tobacco: Never Used  Substance and Sexual Activity  . Alcohol use: Yes    Alcohol/week: 0.0 - 1.2  oz    Comment: beer or wine  occassionally  . Drug use: No  . Sexual activity: Yes  Other Topics Concern  . 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      Allergies  Allergen Reactions  . Aldactone [Spironolactone] Other (See Comments)    Hyperkalemia  . Codeine Rash and Hives  . Januvia [Sitagliptin] Other (See Comments)    Diarrhea and heart racing    Current Outpatient Medications  Medication Sig Dispense Refill  . ACCU-CHEK AVIVA PLUS test strip USE TO CHECK BLOOD SUGAR DAILY 100 each 1  . amLODipine (NORVASC) 2.5 MG tablet Take 1 tablet (2.5 mg total) by mouth daily. 90 tablet 3  . aspirin EC 81 MG tablet Take 81 mg by mouth daily.    . carvedilol (COREG) 25 MG tablet TAKE 1 TABLET BY MOUTH TWICE A DAY WITH MEALS 60 tablet 11  . Cholecalciferol (VITAMIN D PO) Take 1,200 Int'l Units by mouth daily.    Marland Kitchen glipiZIDE (GLUCOTROL) 5 MG tablet Take 2 tablets (10 mg total) by mouth 2 (two) times daily before a meal. 360 tablet 3  . lisinopril (PRINIVIL,ZESTRIL) 20 MG tablet Take 1 tablet (20 mg total) by mouth 2 (two) times daily. 180 tablet 3  . metFORMIN (GLUCOPHAGE) 500 MG tablet take 2 tablets by mouth twice a day with food 360 tablet 3  . Multiple Vitamin (MULTIVITAMIN) tablet Take 1 tablet by mouth daily.     . sildenafil (REVATIO) 20 MG tablet TAKE 3 TO 4 TABLETS BY MOUTH AS NEEDED DAILY 50 tablet 11  . simvastatin (ZOCOR) 40 MG tablet Take 1 tablet (40 mg total) by mouth at bedtime. 90 tablet 3  . vitamin B-12 (CYANOCOBALAMIN) 500 MCG tablet Take 500 mcg by mouth daily.     No current facility-administered medications for this visit.     REVIEW OF SYSTEMS:  [X]  denotes positive finding, [ ]  denotes negative finding Cardiac  Comments:  Chest pain or chest pressure:    Shortness of breath upon exertion:    Short of breath when lying flat:    Irregular heart rhythm:        Vascular    Pain in calf, thigh, or hip brought on by  ambulation:    Pain in feet at night that wakes you up from your sleep:     Blood clot in your veins:  Leg swelling:         Pulmonary    Oxygen at home:    Productive cough:     Wheezing:         Neurologic    Sudden weakness in arms or legs:     Sudden numbness in arms or legs:     Sudden onset of difficulty speaking or slurred speech:    Temporary loss of vision in one eye:     Problems with dizziness:         Gastrointestinal    Blood in stool:     Vomited blood:         Genitourinary    Burning when urinating:     Blood in urine:        Psychiatric    Major depression:         Hematologic    Bleeding problems:    Problems with blood clotting too easily:        Skin    Rashes or ulcers:        Constitutional    Fever or chills:     PHYSICAL EXAM:   Vitals:   05/27/17 1524 05/27/17 1526  BP: (!) 197/83 (!) 189/88  Pulse: 70   Resp: 20   SpO2: 97%   Weight: 187 lb (84.8 kg)   Height: 6\' 2"  (1.88 m)    GENERAL: The patient is a well-nourished male, in no acute distress. The vital signs are documented above. CARDIAC: There is a regular rate and rhythm.  VASCULAR: I do not detect carotid bruits. PULMONARY: There is good air exchange bilaterally without wheezing or rales. ABDOMEN: Soft and non-tender with normal pitched bowel sounds.  MUSCULOSKELETAL: There are no major deformities or cyanosis. NEUROLOGIC: No focal weakness or paresthesias are detected. SKIN: There are no ulcers or rashes noted. PSYCHIATRIC: The patient has a normal affect.  DATA:    CAROTID DUPLEX: I have independently interpreted his carotid duplex scan today.  He has a less than 39% carotid stenosis bilaterally.  Both vertebral arteries are patent with antegrade flow.  MEDICAL ISSUES:   CAROTID EVALUATION: The patient's carotid duplex scan today shows no evidence of significant carotid disease on either side.  He is asymptomatic.  I do not think routine follow-up carotid studies are  necessary.  We will continue to follow his endovascular aneurysm repair on a yearly basis.  He is due for a follow-up in October of this year.  Deitra Mayo [facility] [phone]

## 2017-06-05 DIAGNOSIS — E113291 Type 2 diabetes mellitus with mild nonproliferative diabetic retinopathy without macular edema, right eye: Secondary | ICD-10-CM | POA: Diagnosis not present

## 2017-06-05 DIAGNOSIS — H35031 Hypertensive retinopathy, right eye: Secondary | ICD-10-CM | POA: Diagnosis not present

## 2017-06-05 DIAGNOSIS — H35032 Hypertensive retinopathy, left eye: Secondary | ICD-10-CM | POA: Diagnosis not present

## 2017-06-05 DIAGNOSIS — I10 Essential (primary) hypertension: Secondary | ICD-10-CM | POA: Diagnosis not present

## 2017-06-27 ENCOUNTER — Telehealth: Payer: Self-pay

## 2017-06-27 DIAGNOSIS — E1159 Type 2 diabetes mellitus with other circulatory complications: Secondary | ICD-10-CM

## 2017-06-27 NOTE — Telephone Encounter (Signed)
Copied from [place]. Topic: General - Other >> Jun 27, 2017  3:35 PM Synthia Innocent wrote: Reason for CRM: Patient would like to have Lab work done at [facility] [place], please advise

## 2017-06-29 NOTE — Telephone Encounter (Signed)
Ordered. Thanks

## 2017-06-30 NOTE — Telephone Encounter (Signed)
Patient advised.

## 2017-07-01 ENCOUNTER — Encounter: Payer: Medicare Other | Admitting: *Deleted

## 2017-07-01 ENCOUNTER — Telehealth: Payer: Self-pay | Admitting: Cardiology

## 2017-07-01 NOTE — Telephone Encounter (Signed)
LMOVM reminding pt to send remote transmission.   

## 2017-07-02 ENCOUNTER — Encounter: Payer: Self-pay | Admitting: Cardiology

## 2017-07-02 ENCOUNTER — Other Ambulatory Visit: Payer: Medicare Other

## 2017-07-04 ENCOUNTER — Ambulatory Visit: Payer: Medicare Other | Admitting: Family Medicine

## 2017-07-07 ENCOUNTER — Other Ambulatory Visit (INDEPENDENT_AMBULATORY_CARE_PROVIDER_SITE_OTHER): Payer: Medicare Other

## 2017-07-07 ENCOUNTER — Ambulatory Visit (INDEPENDENT_AMBULATORY_CARE_PROVIDER_SITE_OTHER): Payer: Medicare Other | Admitting: *Deleted

## 2017-07-07 ENCOUNTER — Ambulatory Visit (INDEPENDENT_AMBULATORY_CARE_PROVIDER_SITE_OTHER): Payer: Medicare Other | Admitting: Internal Medicine

## 2017-07-07 ENCOUNTER — Ambulatory Visit: Payer: Medicare Other | Admitting: Family Medicine

## 2017-07-07 ENCOUNTER — Encounter: Payer: Self-pay | Admitting: Internal Medicine

## 2017-07-07 VITALS — BP 169/72 | HR 69 | Ht 73.0 in | Wt 187.0 lb

## 2017-07-07 DIAGNOSIS — Z95810 Presence of automatic (implantable) cardiac defibrillator: Secondary | ICD-10-CM | POA: Diagnosis not present

## 2017-07-07 DIAGNOSIS — I447 Left bundle-branch block, unspecified: Secondary | ICD-10-CM | POA: Diagnosis not present

## 2017-07-07 DIAGNOSIS — I5022 Chronic systolic (congestive) heart failure: Secondary | ICD-10-CM

## 2017-07-07 DIAGNOSIS — I255 Ischemic cardiomyopathy: Secondary | ICD-10-CM | POA: Diagnosis not present

## 2017-07-07 DIAGNOSIS — I519 Heart disease, unspecified: Secondary | ICD-10-CM | POA: Diagnosis not present

## 2017-07-07 DIAGNOSIS — E1159 Type 2 diabetes mellitus with other circulatory complications: Secondary | ICD-10-CM

## 2017-07-07 LAB — COMPREHENSIVE METABOLIC PANEL
ALT: 17 U/L (ref 0–53)
AST: 15 U/L (ref 0–37)
Albumin: 3.8 g/dL (ref 3.5–5.2)
Alkaline Phosphatase: 55 U/L (ref 39–117)
BUN: 28 mg/dL — ABNORMAL HIGH (ref 6–23)
CO2: 28 mEq/L (ref 19–32)
Calcium: 9.7 mg/dL (ref 8.4–10.5)
Chloride: 100 mEq/L (ref 96–112)
Creatinine, Ser: 1.24 mg/dL (ref 0.40–1.50)
GFR: 61.12 mL/min (ref >60.00)
Glucose, Bld: 258 mg/dL — ABNORMAL HIGH (ref 70–99)
Potassium: 4.8 mEq/L (ref 3.5–5.1)
Sodium: 136 mEq/L (ref 135–145)
Total Bilirubin: 0.6 mg/dL (ref 0.2–1.2)
Total Protein: 6.9 g/dL (ref 6.0–8.3)

## 2017-07-07 LAB — LIPID PANEL
Cholesterol: 152 mg/dL (ref 0–200)
HDL: 35.7 mg/dL — ABNORMAL LOW (ref >39.00)
NonHDL: 116.43
Total CHOL/HDL Ratio: 4
Triglycerides: 293 mg/dL — ABNORMAL HIGH (ref 0.0–149.0)
VLDL: 58.6 mg/dL — ABNORMAL HIGH (ref 0.0–40.0)

## 2017-07-07 LAB — HEMOGLOBIN A1C: Hgb A1c MFr Bld: 8.7 % — ABNORMAL HIGH (ref 4.6–6.5)

## 2017-07-07 LAB — LDL CHOLESTEROL, DIRECT: Direct LDL: 71 mg/dL

## 2017-07-07 NOTE — Progress Notes (Signed)
Remote ICD transmission.   

## 2017-07-07 NOTE — Progress Notes (Signed)
Patient Care Team: Tonia Ghent, MD as PCP - General Angelia Mould, MD (Vascular Surgery) Arta Silence, MD (Gastroenterology) Michael Boston, MD (General Surgery) Eustace Moore, MD (Neurosurgery) Idolina Primer [facility]) Jerline Pain, MD (Cardiology)   HPI  Luke [name] is a 72 y.o. male Seen in followup for an CRT-  ICD implanted for intermittent complete heart block in the setting of ischemic cardiomyopathy and left bundle branch block.  LHC 2013  2V CAD Left anterior descending (LAD): focal 70-80% proximal. Long 40% mid vessel. Right coronary artery (RCA): Diffusely diseased. 50-60% mid vessel, 70% distal, 70% at bifurcation of PDA/PLOM.   DATE TEST EF   3/14 Echo   30-35 %          He has been intolerant of Aldactone in the past because of hyperkalemia No complaints of chest pain shortness of breath.  He is doing cardio 6 days a week.  He has had no edema or palpitations.   Past Medical History:  Diagnosis Date  . AAA (abdominal aortic aneurysm) ([place]) 2011   Per vascular surgery  . Anxiety    no med. in use for anxiety but pt. speaks openly of his stress & anxious feelings regarding impending surgery    . Atrial fibrillation ([place])   . Automatic implantable cardioverter-defibrillator in situ   . CAD (coronary artery disease)    Presumed CAD with nuclear scan October 09, 2011,  large anteroseptal MI and inferior MI. Catheterization scheduled October 15, 2011  . Cardiomyopathy [facility])    Nuclear, October 09, 2011, EF 30%, multiple focal wall motion abnormalities  . Diabetes mellitus    type II  . Drug therapy    Hyperkalemia with spironolactone  . Ejection fraction < 50%    EF 30%, nuclear, October 09, 2011  . Ejection fraction < 50%    EF previously 30%  //   EF 30-35%, echo, July 14, 2012, severe diffuse hypokinesis, PA pressure 43 mm mercury  . Fall due to ice or snow Feb.  19, 2015  . HLD (hyperlipidemia)   . Hypertension    white coat HTN-- often  elevated in office and controlled on outside checks.  . ICD (implantable cardioverter-defibrillator) in place    CRT-D placed March, 2014 complete heart block and k dysfunction  . LBBB (left bundle branch block)    LBBB on EKG October 11, 2011,  no prior EKG has been done  . Low testosterone    Hx of  . Myocardial infarction ([place])   . Pacemaker   . PAD (peripheral artery disease) ([place])     Past Surgical History:  Procedure Laterality Date  . ABDOMINAL AORTAGRAM N/A 10/28/2011   Procedure: ABDOMINAL [name];  Surgeon: Angelia Mould, MD;  Location: [facility] CATH LAB;  Service: Cardiovascular;  Laterality: N/A;  . ABDOMINAL AORTIC ANEURYSM REPAIR     EVAR   . BI-VENTRICULAR IMPLANTABLE CARDIOVERTER DEFIBRILLATOR N/A 07/16/2012   Procedure: BI-VENTRICULAR IMPLANTABLE CARDIOVERTER DEFIBRILLATOR  (CRT-D);  Surgeon: Deboraha Sprang, MD;  Location: [facility] CATH LAB;  Service: Cardiovascular;  Laterality: N/A;  . CARDIAC CATHETERIZATION    . COLONOSCOPY    . HERNIA REPAIR  Jan. 9, 2015  . INSERTION OF MESH N/A 05/14/2013   Procedure: INSERTION OF MESH;  Surgeon: Adin Hector, MD;  Location: [place];  Service: General;  Laterality: N/A;  . LOWER EXTREMITY ANGIOGRAM Bilateral 10/28/2011   Procedure: LOWER EXTREMITY ANGIOGRAM;  Surgeon: Harrell Gave  Nicole Cella, MD;  Location[facility] CATH LAB;  Service: Cardiovascular;  Laterality: Bilateral;  . PACEMAKER INSERTION  07-16-12   pacemaker/defibrilator  . POSTERIOR CERVICAL FUSION/FORAMINOTOMY N/A 06/09/2014   Procedure: Laminectomy - Cervical two-Cervcial four posterior cervical instrumented fusion Cervical two-cervical four;  Surgeon: Eustace Moore, MD;  Location: [place] NEURO ORS;  Service: Neurosurgery;  Laterality: N/A;  posterior   . TONSILLECTOMY     as a child   . UMBILICAL HERNIA REPAIR N/A 05/14/2013   Procedure: LAPAROSCOPIC exploration and repair of hernia in abdominal ;  Surgeon: Adin Hector, MD;  Location: [place] OR;  Service: General;  Laterality: N/A;     Current Outpatient Medications  Medication Sig Dispense Refill  . ACCU-CHEK AVIVA PLUS test strip USE TO CHECK BLOOD SUGAR DAILY 100 each 1  . amLODipine (NORVASC) 2.5 MG tablet Take 1 tablet (2.5 mg total) by mouth daily. 90 tablet 3  . aspirin EC 81 MG tablet Take 81 mg by mouth daily.    . carvedilol (COREG) 25 MG tablet TAKE 1 TABLET BY MOUTH TWICE A DAY WITH MEALS 60 tablet 11  . Cholecalciferol (VITAMIN D PO) Take 1,200 Int'l Units by mouth daily.    Marland Kitchen glipiZIDE (GLUCOTROL) 5 MG tablet Take 2 tablets (10 mg total) by mouth 2 (two) times daily before a meal. 360 tablet 3  . lisinopril (PRINIVIL,ZESTRIL) 20 MG tablet Take 1 tablet (20 mg total) by mouth 2 (two) times daily. 180 tablet 3  . metFORMIN (GLUCOPHAGE) 500 MG tablet take 2 tablets by mouth twice a day with food 360 tablet 3  . Multiple Vitamin (MULTIVITAMIN) tablet Take 1 tablet by mouth daily.     . sildenafil (REVATIO) 20 MG tablet TAKE 3 TO 4 TABLETS BY MOUTH AS NEEDED DAILY 50 tablet 11  . simvastatin (ZOCOR) 40 MG tablet Take 1 tablet (40 mg total) by mouth at bedtime. 90 tablet 3  . vitamin B-12 (CYANOCOBALAMIN) 500 MCG tablet Take 500 mcg by mouth daily.     No current facility-administered medications for this visit.     Allergies  Allergen Reactions  . Aldactone [Spironolactone] Other (See Comments)    Hyperkalemia  . Codeine Rash and Hives  . Januvia [Sitagliptin] Other (See Comments)    Diarrhea and heart racing    Review of Systems negative except from HPI and PMH  Physical Exam BP (!) 169/72   Pulse 69   Ht 6\' 1"  (1.854 m)   Wt 187 lb (84.8 kg)   SpO2 96%   BMI 24.67 kg/m  Well developed and nourished in no acute distress HENT normal Neck supple with JVP-6 Clear Regular rate and rhythm, no murmurs or gallops Abd-soft with active BS No Clubbing cyanosis edema Skin-warm and dry A & Oriented  Grossly normal sensory and motor function    ECG demonstrates P synchronous pacing with a  biventricular configuration, negative QRS in lead I positive QRS in lead V1   Assessment and  Plan  Ischemic Cardiomyopathy    Chronic HF systolic    Implantable Defibrillator CRT  St Jude  The patient's device was interrogated.  The information was reviewed.no reprogramming   Complete heart Block   Hyperlipidemia  Hypertension   Without symptoms of ischemia   Blood pressure is markedly elevated today, but prior to coming to the office it was in the 130 range.  His LV function was last assessed about 4 or 5 years ago.  We will recheck it.  In  the event that he remains depressed, we will consider the transition from ACEs to [facility].  He is euvolemic  His LDL was checked this morning it was 71 hyperlipidemia my recommendation would be that we think about a more powerful statin.  I will send this recommendation over to Dr. Damita Dunnings.   We spent more than 50% of our >25 min visit in face to face counseling regarding the above

## 2017-07-07 NOTE — Patient Instructions (Signed)
Medication Instructions:  Your physician recommends that you continue on your current medications as directed. Please refer to the Current Medication list given to you today.  Labwork: None ordered.  Testing/Procedures: Your physician has requested that you have an echocardiogram. Echocardiography is a painless test that uses sound waves to create images of your heart. It provides your doctor with information about the size and shape of your heart and how well your heart's chambers and valves are working. This procedure takes approximately one hour. There are no restrictions for this procedure.   Follow-Up: Your physician recommends that you schedule a follow-up appointment in: One Year with Tommye Standard, PA  Any Other Special Instructions Will Be Listed Below (If Applicable).     If you need a refill on your cardiac medications before your next appointment, please call your pharmacy.

## 2017-07-08 LAB — CUP PACEART REMOTE DEVICE CHECK
Battery Remaining Longevity: 34 mo
Battery Remaining Percentage: 38 %
Battery Voltage: 2.92 V
Brady Statistic AP VP Percent: 20 %
Brady Statistic AP VS Percent: 1 %
Brady Statistic AS VP Percent: 80 %
Brady Statistic AS VS Percent: 1 %
Brady Statistic RA Percent Paced: 20 %
Date Time Interrogation Session: 20190304065027
HighPow Impedance: 80 Ohm
HighPow Impedance: 80 Ohm
Implantable Lead Implant Date: 20140313
Implantable Lead Implant Date: 20140313
Implantable Lead Implant Date: 20140313
Implantable Lead Location: 753858
Implantable Lead Location: 753859
Implantable Lead Location: 753860
Implantable Pulse Generator Implant Date: 20140313
Lead Channel Impedance Value: 450 Ohm
Lead Channel Impedance Value: 480 Ohm
Lead Channel Impedance Value: 960 Ohm
Lead Channel Pacing Threshold Amplitude: 0.75 V
Lead Channel Pacing Threshold Amplitude: 0.75 V
Lead Channel Pacing Threshold Amplitude: 1.5 V
Lead Channel Pacing Threshold Pulse Width: 0.5 ms
Lead Channel Pacing Threshold Pulse Width: 0.5 ms
Lead Channel Pacing Threshold Pulse Width: 0.6 ms
Lead Channel Sensing Intrinsic Amplitude: 3.7 mV
Lead Channel Sensing Intrinsic Amplitude: 7.9 mV
Lead Channel Setting Pacing Amplitude: 2 V
Lead Channel Setting Pacing Amplitude: 2 V
Lead Channel Setting Pacing Amplitude: 2.5 V
Lead Channel Setting Pacing Pulse Width: 0.5 ms
Lead Channel Setting Pacing Pulse Width: 0.6 ms
Lead Channel Setting Sensing Sensitivity: 0.5 mV
Pulse Gen Serial Number: 7097007

## 2017-07-09 ENCOUNTER — Other Ambulatory Visit: Payer: Self-pay | Admitting: Cardiology

## 2017-07-09 ENCOUNTER — Encounter: Payer: Self-pay | Admitting: Cardiology

## 2017-07-10 ENCOUNTER — Encounter: Payer: Self-pay | Admitting: Family Medicine

## 2017-07-10 ENCOUNTER — Ambulatory Visit (INDEPENDENT_AMBULATORY_CARE_PROVIDER_SITE_OTHER): Payer: Medicare Other | Admitting: Family Medicine

## 2017-07-10 DIAGNOSIS — E1159 Type 2 diabetes mellitus with other circulatory complications: Secondary | ICD-10-CM | POA: Diagnosis not present

## 2017-07-10 DIAGNOSIS — E785 Hyperlipidemia, unspecified: Secondary | ICD-10-CM | POA: Diagnosis not present

## 2017-07-10 DIAGNOSIS — I255 Ischemic cardiomyopathy: Secondary | ICD-10-CM

## 2017-07-10 LAB — CUP PACEART INCLINIC DEVICE CHECK
Battery Remaining Longevity: 34 mo
Brady Statistic RA Percent Paced: 20 %
Brady Statistic RV Percent Paced: 99.94 %
Date Time Interrogation Session: 20190304193240
Implantable Lead Implant Date: 20140313
Implantable Lead Implant Date: 20140313
Implantable Lead Implant Date: 20140313
Implantable Lead Location: 753858
Implantable Lead Location: 753859
Implantable Lead Location: 753860
Implantable Pulse Generator Implant Date: 20140313
Lead Channel Pacing Threshold Amplitude: 0.75 V
Lead Channel Pacing Threshold Amplitude: 0.75 V
Lead Channel Pacing Threshold Amplitude: 0.75 V
Lead Channel Pacing Threshold Amplitude: 0.75 V
Lead Channel Pacing Threshold Amplitude: 1 V
Lead Channel Pacing Threshold Amplitude: 1 V
Lead Channel Pacing Threshold Pulse Width: 0.5 ms
Lead Channel Pacing Threshold Pulse Width: 0.5 ms
Lead Channel Pacing Threshold Pulse Width: 0.5 ms
Lead Channel Pacing Threshold Pulse Width: 0.5 ms
Lead Channel Pacing Threshold Pulse Width: 0.6 ms
Lead Channel Pacing Threshold Pulse Width: 0.6 ms
Lead Channel Sensing Intrinsic Amplitude: 4.4 mV
Lead Channel Sensing Intrinsic Amplitude: 7.9 mV
Lead Channel Setting Pacing Amplitude: 2 V
Lead Channel Setting Pacing Amplitude: 2 V
Lead Channel Setting Pacing Amplitude: 2.5 V
Lead Channel Setting Pacing Pulse Width: 0.5 ms
Lead Channel Setting Pacing Pulse Width: 0.6 ms
Lead Channel Setting Sensing Sensitivity: 0.5 mV
Pulse Gen Serial Number: 7097007

## 2017-07-10 MED ORDER — INSULIN PEN NEEDLE 31G X 5 MM MISC: 3 refills | Status: DC

## 2017-07-10 MED ORDER — GLUCOSE BLOOD VI STRP: ORAL_STRIP | 3 refills | Status: DC

## 2017-07-10 MED ORDER — INSULIN GLARGINE 100 UNIT/ML SOLOSTAR PEN: 5.0000 [IU] | PEN_INJECTOR | Freq: Every day | SUBCUTANEOUS | 99 refills | Status: DC

## 2017-07-10 NOTE — Progress Notes (Signed)
Diabetes:  Using medications without difficulties:yes Hypoglycemic episodes:no Hyperglycemic episodes: no Feet problems: some tingling at baseline after the fall.   Blood Sugars averaging: higher in the AM after dawn effect at 6AM, lower prior to that.  Up to 200s recently.   eye exam within last year:yes, done 1 month ago.   A1c higher.   He is still working and exercise.   We talked about insulin start.    He had carotid eval per vascular clinic.  Lipids d/w pt.  We talked about more potent statin.  This was tabled until we can address his A1c.    He has echo f/u pending.  We talked about possible entresto use.  His BP is lower at home, controlled on home checks.    PMH and SH reviewed  Meds, vitals, and allergies reviewed.   ROS: Per HPI unless specifically indicated in ROS section   GEN: nad, alert and oriented HEENT: mucous membranes moist NECK: supple w/o LA CV: rrr. PULM: ctab, no inc wob ABD: soft, +bs EXT: no edema SKIN: no acute rash

## 2017-07-10 NOTE — Patient Instructions (Addendum)
Get the lantus and come back for insulin discussion.  We'll do you first shot at the visit.   Bring the pens and the needles to the visit.  We'll go from there.  We can address the statin change later.

## 2017-07-13 NOTE — Assessment & Plan Note (Signed)
He had carotid eval per vascular clinic.  Lipids d/w pt.  We talked about more potent statin.  This was tabled until we can address his A1c.  We only wanted to make one change at a time.

## 2017-07-13 NOTE — Assessment & Plan Note (Signed)
A1c higher.   He is still working and exercise.   We talked about insulin start.   He agrees to start.   He'll get the lantus and come back for insulin discussion.  We can do his first shot at the visit.    He had carotid eval per vascular clinic.  Lipids d/w pt.  We talked about more potent statin.  This was tabled until we can address his A1c.  We only wanted to make one change at a time.

## 2017-07-15 ENCOUNTER — Ambulatory Visit (INDEPENDENT_AMBULATORY_CARE_PROVIDER_SITE_OTHER): Payer: Medicare Other

## 2017-07-15 DIAGNOSIS — E1159 Type 2 diabetes mellitus with other circulatory complications: Secondary | ICD-10-CM

## 2017-07-15 NOTE — Progress Notes (Signed)
Patient in today for diabetic insulin training for his Lantus Solostar.  Patient has been administering his wife's insulin for the past 2 years and feels very familiar with the pen and administration process.    Handouts regarding pen use and administration given and reviewed with patient.  Patient educated and reinforced best, safe practice regarding injection. He was not aware that you needed to use a new needle with each dose but, now, verbalizes understanding and agrees to follow entire proper procedure as practiced and outlined for him today.  In addition, we reviewed a proper testing schedule now that he is a new start to insulin. He is aware to check sugars at least bid (once in am fasting and once 2 hours post prandial before bed) to allow [place] time to level him off with dosing. He was also encouraged to check any time he doesn't feel well.  We reviewed signs and symptoms of hyper and hypo glycemia and how to appropriately manage. Handouts were given regarding both of these measures.  Patient given opportunity to ask any questions and verbalizes understanding of all instructions given today.   Patient demonstrated independent, safe administration of 5units of insulin via Lantus solostar pen today in the office under my supervision.  He did not require any assistance and verbalizes feeling comfortable performing this daily from now on and knows to call the office if any questions or concerns.  I informed patient to please record his sugars bid and report back to [place] via mychart in 1-2 weeks. (unless anything concerning, call us sooner) for [place] to review and consider unit adjustment.  Spent 35 minutes with patient providing education.  Patient was pleasant and very cooperative; demonstrating an excellent grasp/knowledge of management.

## 2017-07-16 ENCOUNTER — Ambulatory Visit (HOSPITAL_COMMUNITY): Payer: Medicare Other | Attending: Cardiology

## 2017-07-16 ENCOUNTER — Other Ambulatory Visit: Payer: Self-pay

## 2017-07-16 DIAGNOSIS — E1151 Type 2 diabetes mellitus with diabetic peripheral angiopathy without gangrene: Secondary | ICD-10-CM | POA: Insufficient documentation

## 2017-07-16 DIAGNOSIS — I447 Left bundle-branch block, unspecified: Secondary | ICD-10-CM | POA: Insufficient documentation

## 2017-07-16 DIAGNOSIS — I340 Nonrheumatic mitral (valve) insufficiency: Secondary | ICD-10-CM | POA: Insufficient documentation

## 2017-07-16 DIAGNOSIS — E785 Hyperlipidemia, unspecified: Secondary | ICD-10-CM | POA: Insufficient documentation

## 2017-07-16 DIAGNOSIS — Z87891 Personal history of nicotine dependence: Secondary | ICD-10-CM | POA: Insufficient documentation

## 2017-07-16 DIAGNOSIS — Z95810 Presence of automatic (implantable) cardiac defibrillator: Secondary | ICD-10-CM | POA: Diagnosis not present

## 2017-07-16 DIAGNOSIS — I255 Ischemic cardiomyopathy: Secondary | ICD-10-CM

## 2017-07-16 DIAGNOSIS — I119 Hypertensive heart disease without heart failure: Secondary | ICD-10-CM | POA: Insufficient documentation

## 2017-07-16 NOTE — Progress Notes (Signed)
I reviewed note, was available for consultation on the day of service listed in this note, and agree with documentation and plan. Elsie Stain, MD.

## 2017-07-24 ENCOUNTER — Telehealth: Payer: Self-pay | Admitting: Family Medicine

## 2017-07-24 NOTE — Telephone Encounter (Signed)
I would start adding 1 unit of insulin per day as long as his AM sugar is >150.   If he took 5 units last night and had sugar of 171 this AM, then I would go up to 6 units.   Continue adding 1 unit per day until AM sugar is 100-150. At that point, then continue the dose as is.  If AM sugar AM sugar <100, then would decrease 1 unit from the next dose.  See how that goes and update me in a few days.   Thanks.

## 2017-07-24 NOTE — Telephone Encounter (Signed)
Pt called to give you last weeks blood sugar log:  3/13; 8am=160, 10pm=171 3/14; 8am=201, 10pm=101 3/15; 8am=205, 10pm=175 3/16; 8am=229, 10pm=196 3/17; 8am=151, 10pm=124 3/18; 8am=159, 10pm=74 3/19; 8am=159, 10pm=99 3/20; 8am=179, 10pm=184 3/21; 8am=171  Pt is at 5 units, and thinks he need to go up to 15 units. Please advise. Pt aware he will be called with further instructions.

## 2017-07-24 NOTE — Telephone Encounter (Signed)
Patient advised and repeated instructions with understanding.

## 2017-08-18 ENCOUNTER — Other Ambulatory Visit: Payer: Self-pay | Admitting: Cardiology

## 2017-08-19 ENCOUNTER — Other Ambulatory Visit: Payer: Self-pay | Admitting: Cardiology

## 2017-08-27 ENCOUNTER — Other Ambulatory Visit: Payer: Self-pay | Admitting: Family Medicine

## 2017-09-04 ENCOUNTER — Other Ambulatory Visit: Payer: Self-pay | Admitting: Family Medicine

## 2017-09-04 MED ORDER — GLUCOSE BLOOD VI STRP: ORAL_STRIP | 5 refills | Status: DC

## 2017-09-04 NOTE — Telephone Encounter (Signed)
Per 07/15/17 note pt to ck BS bid. Dx E11.59. Refill done per protocol.Pt notified done and pt voiced understanding. Will pick up test strips at [facility] today.

## 2017-09-04 NOTE — Telephone Encounter (Signed)
Copied from [place] [id number]. Topic: Quick Communication - Rx Refill/Question >> Sep 04, 2017 [place] AM Margot Ables wrote: Medication: glucose blood (ACCU-CHEK AVIVA PLUS) test strip - pt states after last OV he was changed to test 2/day and put on insulin. RX was not updated with pharmacy so he cannot refill yet and he is out of test strips. Has the patient contacted their pharmacy? yes Preferred Pharmacy (with phone number or street name): [facility] [place], [place] [phone] (Phone) [phone] (Fax)

## 2017-09-05 ENCOUNTER — Other Ambulatory Visit: Payer: Self-pay | Admitting: *Deleted

## 2017-09-08 ENCOUNTER — Other Ambulatory Visit: Payer: Self-pay | Admitting: *Deleted

## 2017-09-08 NOTE — Telephone Encounter (Signed)
I'll work on the hard copy.  Thanks.  

## 2017-09-08 NOTE — Telephone Encounter (Signed)
Fax received for supplies to check blood sugar.  Form placed in Dr. Josefine Class In [place].

## 2017-09-15 NOTE — Telephone Encounter (Signed)
Patient checking status, states he is out of test strips has been unable to check sugars ongoing for 2 weeks. Please advise Call back [phone]

## 2017-09-15 NOTE — Telephone Encounter (Signed)
Completed form faxed back as instructed.  Advised patient that form was faxed today and will let him know when we hear back on it.

## 2017-10-06 ENCOUNTER — Telehealth: Payer: Self-pay | Admitting: Cardiology

## 2017-10-06 ENCOUNTER — Ambulatory Visit (INDEPENDENT_AMBULATORY_CARE_PROVIDER_SITE_OTHER): Payer: Medicare Other | Admitting: *Deleted

## 2017-10-06 DIAGNOSIS — I5022 Chronic systolic (congestive) heart failure: Secondary | ICD-10-CM

## 2017-10-06 DIAGNOSIS — I255 Ischemic cardiomyopathy: Secondary | ICD-10-CM

## 2017-10-06 NOTE — Telephone Encounter (Signed)
LMOVM reminding pt to send remote transmission.   

## 2017-10-07 NOTE — Progress Notes (Signed)
Remote ICD transmission.   

## 2017-10-08 ENCOUNTER — Encounter: Payer: Self-pay | Admitting: Cardiology

## 2017-10-10 ENCOUNTER — Other Ambulatory Visit: Payer: Self-pay | Admitting: Family Medicine

## 2017-10-10 NOTE — Telephone Encounter (Signed)
Name of Medication: Sildenafil Name of Pharmacy: [facility] [place] or Written Date and Quantity:  50 tablet 11 08/22/2016  Last Office Visit and Type: 07/20/17  FU - DM Next Office Visit and Type: 12/08/17 CPE Last Controlled Substance Agreement Date: None Last UDS: None

## 2017-10-11 NOTE — Telephone Encounter (Signed)
Sent. Thanks.   

## 2017-10-23 LAB — CUP PACEART REMOTE DEVICE CHECK
Battery Remaining Longevity: 31 mo
Battery Remaining Percentage: 35 %
Battery Voltage: 2.9 V
Brady Statistic AP VP Percent: 21 %
Brady Statistic AP VS Percent: 1 %
Brady Statistic AS VP Percent: 79 %
Brady Statistic AS VS Percent: 1 %
Brady Statistic RA Percent Paced: 21 %
Date Time Interrogation Session: 20190604043212
HighPow Impedance: 83 Ohm
HighPow Impedance: 83 Ohm
Implantable Lead Implant Date: 20140313
Implantable Lead Implant Date: 20140313
Implantable Lead Implant Date: 20140313
Implantable Lead Location: 753858
Implantable Lead Location: 753859
Implantable Lead Location: 753860
Implantable Pulse Generator Implant Date: 20140313
Lead Channel Impedance Value: 1025 Ohm
Lead Channel Impedance Value: 460 Ohm
Lead Channel Impedance Value: 510 Ohm
Lead Channel Pacing Threshold Amplitude: 0.625 V
Lead Channel Pacing Threshold Amplitude: 0.75 V
Lead Channel Pacing Threshold Amplitude: 1 V
Lead Channel Pacing Threshold Pulse Width: 0.5 ms
Lead Channel Pacing Threshold Pulse Width: 0.5 ms
Lead Channel Pacing Threshold Pulse Width: 0.6 ms
Lead Channel Sensing Intrinsic Amplitude: 4.6 mV
Lead Channel Sensing Intrinsic Amplitude: 7.9 mV
Lead Channel Setting Pacing Amplitude: 2 V
Lead Channel Setting Pacing Amplitude: 2 V
Lead Channel Setting Pacing Amplitude: 2.5 V
Lead Channel Setting Pacing Pulse Width: 0.5 ms
Lead Channel Setting Pacing Pulse Width: 0.6 ms
Lead Channel Setting Sensing Sensitivity: 0.5 mV
Pulse Gen Serial Number: 7097007

## 2017-11-10 ENCOUNTER — Emergency Department (HOSPITAL_COMMUNITY): Payer: Medicare Other

## 2017-11-10 ENCOUNTER — Encounter (HOSPITAL_COMMUNITY): Payer: Self-pay

## 2017-11-10 ENCOUNTER — Inpatient Hospital Stay (HOSPITAL_COMMUNITY)
Admission: EM | Admit: 2017-11-10 | Discharge: 2017-11-14 | DRG: 439 | Disposition: A | Discharge status: Home / Self Care | Payer: Medicare Other | Attending: Family Medicine | Admitting: Family Medicine

## 2017-11-10 ENCOUNTER — Other Ambulatory Visit: Payer: Self-pay

## 2017-11-10 ENCOUNTER — Ambulatory Visit: Payer: Self-pay

## 2017-11-10 DIAGNOSIS — Z823 Family history of stroke: Secondary | ICD-10-CM

## 2017-11-10 DIAGNOSIS — I2510 Atherosclerotic heart disease of native coronary artery without angina pectoris: Secondary | ICD-10-CM | POA: Diagnosis present

## 2017-11-10 DIAGNOSIS — I4891 Unspecified atrial fibrillation: Secondary | ICD-10-CM | POA: Diagnosis present

## 2017-11-10 DIAGNOSIS — I10 Essential (primary) hypertension: Secondary | ICD-10-CM | POA: Diagnosis present

## 2017-11-10 DIAGNOSIS — E781 Pure hyperglyceridemia: Secondary | ICD-10-CM | POA: Diagnosis present

## 2017-11-10 DIAGNOSIS — Z8249 Family history of ischemic heart disease and other diseases of the circulatory system: Secondary | ICD-10-CM

## 2017-11-10 DIAGNOSIS — R079 Chest pain, unspecified: Secondary | ICD-10-CM | POA: Diagnosis not present

## 2017-11-10 DIAGNOSIS — N183 Chronic kidney disease, stage 3 unspecified: Secondary | ICD-10-CM | POA: Diagnosis present

## 2017-11-10 DIAGNOSIS — Z79899 Other long term (current) drug therapy: Secondary | ICD-10-CM

## 2017-11-10 DIAGNOSIS — E1122 Type 2 diabetes mellitus with diabetic chronic kidney disease: Secondary | ICD-10-CM | POA: Diagnosis not present

## 2017-11-10 DIAGNOSIS — R1013 Epigastric pain: Secondary | ICD-10-CM | POA: Diagnosis not present

## 2017-11-10 DIAGNOSIS — E1169 Type 2 diabetes mellitus with other specified complication: Secondary | ICD-10-CM | POA: Diagnosis present

## 2017-11-10 DIAGNOSIS — Z981 Arthrodesis status: Secondary | ICD-10-CM

## 2017-11-10 DIAGNOSIS — Z8349 Family history of other endocrine, nutritional and metabolic diseases: Secondary | ICD-10-CM

## 2017-11-10 DIAGNOSIS — Z7982 Long term (current) use of aspirin: Secondary | ICD-10-CM

## 2017-11-10 DIAGNOSIS — E785 Hyperlipidemia, unspecified: Secondary | ICD-10-CM | POA: Diagnosis present

## 2017-11-10 DIAGNOSIS — Z87891 Personal history of nicotine dependence: Secondary | ICD-10-CM

## 2017-11-10 DIAGNOSIS — I447 Left bundle-branch block, unspecified: Secondary | ICD-10-CM | POA: Diagnosis present

## 2017-11-10 DIAGNOSIS — K859 Acute pancreatitis without necrosis or infection, unspecified: Secondary | ICD-10-CM | POA: Diagnosis present

## 2017-11-10 DIAGNOSIS — I252 Old myocardial infarction: Secondary | ICD-10-CM

## 2017-11-10 DIAGNOSIS — Z833 Family history of diabetes mellitus: Secondary | ICD-10-CM

## 2017-11-10 DIAGNOSIS — R6510 Systemic inflammatory response syndrome (SIRS) of non-infectious origin without acute organ dysfunction: Secondary | ICD-10-CM | POA: Diagnosis present

## 2017-11-10 DIAGNOSIS — Z801 Family history of malignant neoplasm of trachea, bronchus and lung: Secondary | ICD-10-CM

## 2017-11-10 DIAGNOSIS — I129 Hypertensive chronic kidney disease with stage 1 through stage 4 chronic kidney disease, or unspecified chronic kidney disease: Secondary | ICD-10-CM | POA: Diagnosis not present

## 2017-11-10 DIAGNOSIS — Z888 Allergy status to other drugs, medicaments and biological substances status: Secondary | ICD-10-CM

## 2017-11-10 DIAGNOSIS — E1159 Type 2 diabetes mellitus with other circulatory complications: Secondary | ICD-10-CM | POA: Diagnosis present

## 2017-11-10 DIAGNOSIS — Z885 Allergy status to narcotic agent status: Secondary | ICD-10-CM

## 2017-11-10 DIAGNOSIS — Z95810 Presence of automatic (implantable) cardiac defibrillator: Secondary | ICD-10-CM

## 2017-11-10 DIAGNOSIS — E1151 Type 2 diabetes mellitus with diabetic peripheral angiopathy without gangrene: Secondary | ICD-10-CM | POA: Diagnosis present

## 2017-11-10 DIAGNOSIS — R109 Unspecified abdominal pain: Secondary | ICD-10-CM | POA: Diagnosis not present

## 2017-11-10 DIAGNOSIS — I255 Ischemic cardiomyopathy: Secondary | ICD-10-CM | POA: Diagnosis present

## 2017-11-10 DIAGNOSIS — Z794 Long term (current) use of insulin: Secondary | ICD-10-CM

## 2017-11-10 LAB — CBC
HCT: 46 % (ref 39.0–52.0)
Hemoglobin: 16 g/dL (ref 13.0–17.0)
MCH: 32.8 pg (ref 26.0–34.0)
MCHC: 34.8 g/dL (ref 30.0–36.0)
MCV: 94.3 fL (ref 78.0–100.0)
Platelets: 293 10*3/uL (ref 150–400)
RBC: 4.88 MIL/uL (ref 4.22–5.81)
RDW: 12.1 % (ref 11.5–15.5)
WBC: 36.8 10*3/uL — ABNORMAL HIGH (ref 4.0–10.5)

## 2017-11-10 LAB — I-STAT TROPONIN, ED: Troponin i, poc: 0.04 ng/mL (ref 0.00–0.08)

## 2017-11-10 LAB — LIPASE, BLOOD: Lipase: 1043 U/L — ABNORMAL HIGH (ref 11–51)

## 2017-11-10 LAB — COMPREHENSIVE METABOLIC PANEL
ALT: 67 U/L — ABNORMAL HIGH (ref 0–44)
AST: 44 U/L — ABNORMAL HIGH (ref 15–41)
Albumin: 4 g/dL (ref 3.5–5.0)
Alkaline Phosphatase: 84 U/L (ref 38–126)
Anion gap: 17 — ABNORMAL HIGH (ref 5–15)
BUN: 30 mg/dL — ABNORMAL HIGH (ref 8–23)
CO2: 22 mmol/L (ref 22–32)
Calcium: 9.9 mg/dL (ref 8.9–10.3)
Chloride: 94 mmol/L — ABNORMAL LOW (ref 98–111)
Creatinine, Ser: 1.38 mg/dL — ABNORMAL HIGH (ref 0.61–1.24)
GFR calc Af Amer: 58 mL/min — ABNORMAL LOW (ref >60)
GFR calc non Af Amer: 50 mL/min — ABNORMAL LOW (ref >60)
Glucose, Bld: 318 mg/dL — ABNORMAL HIGH (ref 70–99)
Potassium: 4.7 mmol/L (ref 3.5–5.1)
Sodium: 133 mmol/L — ABNORMAL LOW (ref 135–145)
Total Bilirubin: 1.3 mg/dL — ABNORMAL HIGH (ref 0.3–1.2)
Total Protein: 7.3 g/dL (ref 6.5–8.1)

## 2017-11-10 LAB — I-STAT CG4 LACTIC ACID, ED: Lactic Acid, Venous: 1.95 mmol/L — ABNORMAL HIGH (ref 0.5–1.9)

## 2017-11-10 LAB — CBG MONITORING, ED: Glucose-Capillary: 303 mg/dL — ABNORMAL HIGH (ref 70–99)

## 2017-11-10 MED ORDER — IOPAMIDOL (ISOVUE-300) INJECTION 61%: 100.0000 mL | Freq: Once | INTRAVENOUS | Status: DC | PRN

## 2017-11-10 MED ORDER — ASPIRIN EC 81 MG PO TBEC
81.0000 mg | DELAYED_RELEASE_TABLET | Freq: Every day | ORAL | Status: DC
  Administered 2017-11-11 – 2017-11-14 (×4): 81 mg via ORAL
  Filled 2017-11-10 (×4): qty 1

## 2017-11-10 MED ORDER — IOHEXOL 300 MG/ML  SOLN
100.0000 mL | Freq: Once | INTRAMUSCULAR | Status: AC | PRN
  Administered 2017-11-10: 100 mL via INTRAVENOUS

## 2017-11-10 MED ORDER — ZOLPIDEM TARTRATE 5 MG PO TABS
5.0000 mg | ORAL_TABLET | Freq: Every evening | ORAL | Status: DC | PRN
  Filled 2017-11-10: qty 1

## 2017-11-10 MED ORDER — AMLODIPINE BESYLATE 5 MG PO TABS
5.0000 mg | ORAL_TABLET | Freq: Every day | ORAL | Status: DC
  Administered 2017-11-11 – 2017-11-14 (×5): 5 mg via ORAL
  Filled 2017-11-10 (×5): qty 1

## 2017-11-10 MED ORDER — CARVEDILOL 12.5 MG PO TABS
25.0000 mg | ORAL_TABLET | Freq: Two times a day (BID) | ORAL | Status: DC
  Administered 2017-11-11 – 2017-11-14 (×7): 25 mg via ORAL
  Filled 2017-11-10 (×9): qty 2

## 2017-11-10 MED ORDER — ENOXAPARIN SODIUM 40 MG/0.4ML ~~LOC~~ SOLN
40.0000 mg | SUBCUTANEOUS | Status: DC
  Administered 2017-11-11 – 2017-11-14 (×4): 40 mg via SUBCUTANEOUS
  Filled 2017-11-10 (×5): qty 0.4

## 2017-11-10 MED ORDER — MORPHINE SULFATE (PF) 4 MG/ML IV SOLN: 2.0000 mg | INTRAVENOUS | Status: DC | PRN

## 2017-11-10 MED ORDER — POLYETHYLENE GLYCOL 3350 17 G PO PACK: 17.0000 g | PACK | Freq: Every day | ORAL | Status: DC | PRN

## 2017-11-10 MED ORDER — INSULIN GLARGINE 100 UNIT/ML ~~LOC~~ SOLN
14.0000 [IU] | Freq: Every day | SUBCUTANEOUS | Status: DC
  Administered 2017-11-11: 14 [IU] via SUBCUTANEOUS
  Filled 2017-11-10: qty 0.14

## 2017-11-10 MED ORDER — INSULIN ASPART 100 UNIT/ML ~~LOC~~ SOLN
0.0000 [IU] | Freq: Three times a day (TID) | SUBCUTANEOUS | Status: DC
  Administered 2017-11-11: 9 [IU] via SUBCUTANEOUS
  Administered 2017-11-11: 7 [IU] via SUBCUTANEOUS
  Administered 2017-11-12: 5 [IU] via SUBCUTANEOUS
  Administered 2017-11-12: 3 [IU] via SUBCUTANEOUS
  Administered 2017-11-12: 5 [IU] via SUBCUTANEOUS
  Administered 2017-11-13 (×3): 3 [IU] via SUBCUTANEOUS
  Administered 2017-11-14 (×2): 2 [IU] via SUBCUTANEOUS

## 2017-11-10 MED ORDER — SODIUM CHLORIDE 0.9 % IV BOLUS
500.0000 mL | Freq: Once | INTRAVENOUS | Status: AC
  Administered 2017-11-10: 500 mL via INTRAVENOUS

## 2017-11-10 MED ORDER — PIPERACILLIN-TAZOBACTAM 3.375 G IVPB
3.3750 g | Freq: Three times a day (TID) | INTRAVENOUS | Status: DC
  Administered 2017-11-11 – 2017-11-14 (×10): 3.375 g via INTRAVENOUS
  Filled 2017-11-10 (×11): qty 50

## 2017-11-10 MED ORDER — CHOLECALCIFEROL 10 MCG (400 UNIT) PO TABS
1200.0000 [IU] | ORAL_TABLET | Freq: Every day | ORAL | Status: DC
  Administered 2017-11-11 – 2017-11-14 (×4): 1200 [IU] via ORAL
  Filled 2017-11-10 (×4): qty 3

## 2017-11-10 MED ORDER — PIPERACILLIN-TAZOBACTAM 3.375 G IVPB 30 MIN
3.3750 g | Freq: Once | INTRAVENOUS | Status: AC
  Administered 2017-11-10: 3.375 g via INTRAVENOUS
  Filled 2017-11-10: qty 50

## 2017-11-10 MED ORDER — SIMVASTATIN 40 MG PO TABS
40.0000 mg | ORAL_TABLET | Freq: Every day | ORAL | Status: DC
  Administered 2017-11-11: 40 mg via ORAL
  Filled 2017-11-10: qty 1

## 2017-11-10 MED ORDER — HYDRALAZINE HCL 20 MG/ML IJ SOLN: 5.0000 mg | INTRAMUSCULAR | Status: DC | PRN

## 2017-11-10 MED ORDER — SODIUM CHLORIDE 0.9 % IV SOLN
INTRAVENOUS | Status: DC
  Administered 2017-11-11 – 2017-11-14 (×4): via INTRAVENOUS

## 2017-11-10 MED ORDER — HYDRALAZINE HCL 25 MG PO TABS
25.0000 mg | ORAL_TABLET | Freq: Three times a day (TID) | ORAL | Status: DC
  Administered 2017-11-11 – 2017-11-14 (×12): 25 mg via ORAL
  Filled 2017-11-10 (×13): qty 1

## 2017-11-10 MED ORDER — CYANOCOBALAMIN 500 MCG PO TABS
500.0000 ug | ORAL_TABLET | Freq: Every day | ORAL | Status: DC
  Administered 2017-11-11 – 2017-11-14 (×4): 500 ug via ORAL
  Filled 2017-11-10 (×7): qty 1

## 2017-11-10 MED ORDER — ONDANSETRON HCL 4 MG/2ML IJ SOLN
4.0000 mg | Freq: Three times a day (TID) | INTRAMUSCULAR | Status: DC | PRN
  Administered 2017-11-12: 4 mg via INTRAVENOUS
  Filled 2017-11-10: qty 2

## 2017-11-10 MED ORDER — ADULT MULTIVITAMIN W/MINERALS CH
1.0000 | ORAL_TABLET | Freq: Every day | ORAL | Status: DC
  Administered 2017-11-11 – 2017-11-14 (×4): 1 via ORAL
  Filled 2017-11-10 (×4): qty 1

## 2017-11-10 NOTE — Telephone Encounter (Signed)
Patient notified as instructed by telephone and verbalized understanding. Patient stated that his pain level is at about a 6. Patient stated that he vomited last night. Patient stated that this started several weeks ago and he had some diarrhea. Patient stated that around July 4th the pain and cramps would start at night and last from 6:00-11:00. Patient stated that he will go ahead and go to the ER because they made need to do some test that can not be done at the office.

## 2017-11-10 NOTE — Telephone Encounter (Signed)
Noted. I'll await the notes.  Thanks.

## 2017-11-10 NOTE — Telephone Encounter (Signed)
Pt. Reports he had some abdominal pain starting last week - gradual. Pain on and off. Pain became worse last night - upper, middle abdomen. Right below my sternum." No fever. States he had an episode of vomiting last night -"I emptied everything in my system." Concerned because blood sugar fasting this morning is 259. Abdominal pain is "6" on scale 1-10. Flow coordinator Rollene Fare) will advise pt.  Reason for Disposition . [1] MODERATE pain (e.g., interferes with normal activities) AND [2] pain comes and goes (cramps) AND [3] present > 24 hours  (Exception: pain with Vomiting or Diarrhea - see that Guideline)  Answer Assessment - Initial Assessment Questions 1. LOCATION: "Where does it hurt?"      Below sternum - upper middle 2. RADIATION: "Does the pain shoot anywhere else?" (e.g., chest, back)     Radiates into back straight through 3. ONSET: "When did the pain begin?" (Minutes, hours or days ago)      Started last week - got worse last night 4. SUDDEN: "Gradual or sudden onset?"     Gradual 5. PATTERN "Does the pain come and go, or is it constant?"    - If constant: "Is it getting better, staying the same, or worsening?"      (Note: Constant means the pain never goes away completely; most serious pain is constant and it progresses)     - If intermittent: "How long does it last?" "Do you have pain now?"     (Note: Intermittent means the pain goes away completely between bouts)     Comes and goes 6. SEVERITY: "How bad is the pain?"  (e.g., Scale 1-10; mild, moderate, or severe)    - MILD (1-3): doesn't interfere with normal activities, abdomen soft and not tender to touch     - MODERATE (4-7): interferes with normal activities or awakens from sleep, tender to touch     - SEVERE (8-10): excruciating pain, doubled over, unable to do any normal activities       6 7. RECURRENT SYMPTOM: "Have you ever had this type of abdominal pain before?" If so, ask: "When was the last time?" and "What happened  that time?"      No 8. CAUSE: "What do you think is causing the abdominal pain?"     Unsure 9. RELIEVING/AGGRAVATING FACTORS: "What makes it better or worse?" (e.g., movement, antacids, bowel movement)     Advil 10. OTHER SYMPTOMS: "Has there been any vomiting, diarrhea, constipation, or urine problems?"       Vomited last night  Protocols used: ABDOMINAL PAIN - MALE-A-AH

## 2017-11-10 NOTE — ED Provider Notes (Signed)
Emergency Department Provider Note   I have reviewed the triage vital signs and the nursing notes.   HISTORY  Chief Complaint Abdominal Pain; Chest Pain; and Abnormal ECG   HPI Luke [name] is a 72 y.o. male with PMH of AAA, CAD Cardiomyopathy, DM, and HLD resents to the emergency department for evaluation of intermittent epigastric abdominal pain radiating to the chest at times.  Pain is been ongoing for the past 3 days periods become more severe and patient presents to the emergency department.  Denies any fevers or chills.  No lower abdominal pain.  He has had some nausea and vomiting.  Patient explains some diarrhea approximately a week ago but this has resolved. No radiation to the back. No change with food. Patient states pain is worse at night.    Past Medical History:  Diagnosis Date  . AAA (abdominal aortic aneurysm) ([place]) 2011   Per vascular surgery  . Anxiety    no med. in use for anxiety but pt. speaks openly of his stress & anxious feelings regarding impending surgery    . Atrial fibrillation ([place])   . Automatic implantable cardioverter-defibrillator in situ   . CAD (coronary artery disease)    Presumed CAD with nuclear scan October 09, 2011,  large anteroseptal MI and inferior MI. Catheterization scheduled October 15, 2011  . Cardiomyopathy [facility])    Nuclear, October 09, 2011, EF 30%, multiple focal wall motion abnormalities  . Diabetes mellitus    type II  . Drug therapy    Hyperkalemia with spironolactone  . Ejection fraction < 50%    EF 30%, nuclear, October 09, 2011  . Ejection fraction < 50%    EF previously 30%  //   EF 30-35%, echo, July 14, 2012, severe diffuse hypokinesis, PA pressure 43 mm mercury  . Fall due to ice or snow Feb.  19, 2015  . HLD (hyperlipidemia)   . Hypertension    white coat HTN-- often elevated in office and controlled on outside checks.  . ICD (implantable cardioverter-defibrillator) in place    CRT-D placed March, 2014 complete heart  block and k dysfunction  . LBBB (left bundle branch block)    LBBB on EKG October 11, 2011,  no prior EKG has been done  . Low testosterone    Hx of  . Myocardial infarction ([place])   . Pacemaker   . PAD (peripheral artery disease) [facility])     Patient Active Problem List   Diagnosis Date Noted  . Acute pancreatitis 11/10/2017  . CKD (chronic kidney disease), stage III ([place]) 11/10/2017  . SIRS (systemic inflammatory response syndrome) ([place]) 11/10/2017  . Pancreatitis 11/10/2017  . Healthcare maintenance 11/22/2016  . Tinnitus 05/23/2015  . Medicare annual wellness visit, initial 10/20/2014  . Advance care planning 10/20/2014  . S/P cervical spinal fusion 06/09/2014  . Preop cardiovascular exam 08/13/2013  . Central cord syndrome ([place]) 06/24/2013  . Encounter for fitting or adjustment of automatic implantable cardioverter-defibrillator 04/20/2013  . Drug therapy   . Umbilical hernia, incarcerated s/p lap repair with mesh 05/14/2013 03/19/2013  . Status post abdominal aortic aneurysm repair 12/30/2012  . HLD (hyperlipidemia) 09/16/2012  . Ejection fraction < 50%   . Complete heart block ([place]) 07/14/2012  . SK (seborrheic keratosis) 02/12/2012  . Shoulder pain 11/08/2011  . CAD (coronary artery disease)   . Ischemic cardiomyopathy   . LBBB (left bundle branch block)   . PVD (peripheral vascular disease) ([place]) 10/08/2010  . ORGANIC  IMPOTENCE 07/05/2010  . Essential hypertension, benign 02/27/2010  . Type 2 diabetes mellitus with vascular disease ([place]) 11/16/2009    Past Surgical History:  Procedure Laterality Date  . ABDOMINAL AORTAGRAM N/A 10/28/2011   Procedure: ABDOMINAL [name];  Surgeon: Angelia Mould, MD;  Location[facility] CATH LAB;  Service: Cardiovascular;  Laterality: N/A;  . ABDOMINAL AORTIC ANEURYSM REPAIR     EVAR   . BI-VENTRICULAR IMPLANTABLE CARDIOVERTER DEFIBRILLATOR N/A 07/16/2012   Procedure: BI-VENTRICULAR IMPLANTABLE CARDIOVERTER DEFIBRILLATOR  (CRT-D);   Surgeon: Deboraha Sprang, MD;  Location: [facility] CATH LAB;  Service: Cardiovascular;  Laterality: N/A;  . CARDIAC CATHETERIZATION    . COLONOSCOPY    . HERNIA REPAIR  Jan. 9, 2015  . INSERTION OF MESH N/A 05/14/2013   Procedure: INSERTION OF MESH;  Surgeon: Adin Hector, MD;  Location: [place];  Service: General;  Laterality: N/A;  . LOWER EXTREMITY ANGIOGRAM Bilateral 10/28/2011   Procedure: LOWER EXTREMITY ANGIOGRAM;  Surgeon: Angelia Mould, MD;  Location: [facility] CATH LAB;  Service: Cardiovascular;  Laterality: Bilateral;  . PACEMAKER INSERTION  07-16-12   pacemaker/defibrilator  . POSTERIOR CERVICAL FUSION/FORAMINOTOMY N/A 06/09/2014   Procedure: Laminectomy - Cervical two-Cervcial four posterior cervical instrumented fusion Cervical two-cervical four;  Surgeon: Eustace Moore, MD;  Location: [place] NEURO ORS;  Service: Neurosurgery;  Laterality: N/A;  posterior   . TONSILLECTOMY     as a child   . UMBILICAL HERNIA REPAIR N/A 05/14/2013   Procedure: LAPAROSCOPIC exploration and repair of hernia in abdominal ;  Surgeon: Adin Hector, MD;  Location: [place];  Service: General;  Laterality: N/A;    Allergies Aldactone [spironolactone]; Codeine; and Januvia [sitagliptin]  Family History  Problem Relation Age of Onset  . Hypertension Mother   . Stroke Mother   . Hyperlipidemia Mother   . Diabetes Sister   . Heart disease Sister        Before age 68  . Hypertension Sister   . Hyperlipidemia Sister   . Heart attack Sister   . Cancer Father        Lung  . Hypertension Son   . Colon cancer Neg Hx   . Prostate cancer Neg Hx     Social History Social History   Tobacco Use  . Smoking status: Former Smoker    Packs/day: 2.00    Years: 40.00    Pack years: 80.00    Types: Cigarettes    Last attempt to quit: 05/06/2004    Years since quitting: 13.5  . Smokeless tobacco: Never Used  Substance Use Topics  . Alcohol use: Yes    Alcohol/week: 0.0 - 1.2 oz    Comment: beer or wine   occassionally  . Drug use: No    Review of Systems  Constitutional: No fever/chills Eyes: No visual changes. ENT: No sore throat. Cardiovascular: Positive chest pain. Respiratory: Denies shortness of breath. Gastrointestinal: Positive epigastric abdominal pain. Positive nausea, no vomiting. Positive diarrhea (resolved).  No constipation. Genitourinary: Negative for dysuria. Musculoskeletal: Negative for back pain. Skin: Negative for rash. Neurological: Negative for headaches, focal weakness or numbness.  10-point ROS otherwise negative.  ____________________________________________   PHYSICAL EXAM:  VITAL SIGNS: ED Triage Vitals  Enc Vitals Group     BP 11/10/17 1749 (!) 138/94     Pulse Rate 11/10/17 1749 (!) 106     Resp 11/10/17 1749 16     Temp 11/10/17 1749 98.7 F (37.1 C)     Temp Source 11/10/17  1749 Oral     SpO2 11/10/17 1749 98 %     Weight 11/10/17 1748 188 lb (85.3 kg)     Height 11/10/17 1748 6\' 3"  (1.905 m)     Pain Score 11/10/17 1756 3   Constitutional: Alert and oriented. Well appearing and in no acute distress. Eyes: Conjunctivae are normal.  Head: Atraumatic. Nose: No congestion/rhinnorhea. Mouth/Throat: Mucous membranes are moist.  Oropharynx non-erythematous. Neck: No stridor. Cardiovascular: Tachycardia. Good peripheral circulation. Grossly normal heart sounds.   Respiratory: Normal respiratory effort.  No retractions. Lungs CTAB. Gastrointestinal: Soft with diffuse epigastric discomfort. No rebound or guarding. No lower abdominal pain. No distention.  Musculoskeletal: No lower extremity tenderness nor edema. No gross deformities of extremities. Neurologic:  Normal speech and language. No gross focal neurologic deficits are appreciated.  Skin:  Skin is warm, dry and intact. No rash noted.  ____________________________________________   LABS (all labs ordered are listed, but only abnormal results are displayed)  Labs Reviewed  CBC -  Abnormal; Notable for the following components:      Result Value   WBC 36.8 (*)    All other components within normal limits  COMPREHENSIVE METABOLIC PANEL - Abnormal; Notable for the following components:   Sodium 133 (*)    Chloride 94 (*)    Glucose, Bld 318 (*)    BUN 30 (*)    Creatinine, Ser 1.38 (*)    AST 44 (*)    ALT 67 (*)    Total Bilirubin 1.3 (*)    GFR calc non Af Amer 50 (*)    GFR calc Af Amer 58 (*)    Anion gap 17 (*)    All other components within normal limits  LIPASE, BLOOD - Abnormal; Notable for the following components:   Lipase 1,043 (*)    All other components within normal limits  COMPREHENSIVE METABOLIC PANEL - Abnormal; Notable for the following components:   Sodium 132 (*)    Chloride 93 (*)    Glucose, Bld 331 (*)    BUN 24 (*)    ALT 54 (*)    Total Bilirubin 1.9 (*)    GFR calc non Af Amer 57 (*)    All other components within normal limits  CBC - Abnormal; Notable for the following components:   WBC 39.3 (*)    All other components within normal limits  I-STAT CG4 LACTIC ACID, ED - Abnormal; Notable for the following components:   Lactic Acid, Venous 1.95 (*)    All other components within normal limits  CBG MONITORING, ED - Abnormal; Notable for the following components:   Glucose-Capillary 303 (*)    All other components within normal limits  CULTURE, BLOOD (ROUTINE X 2)  CULTURE, BLOOD (ROUTINE X 2)  PROCALCITONIN  LACTIC ACID, PLASMA  LIPID PANEL  URINALYSIS, ROUTINE W REFLEX MICROSCOPIC  I-STAT TROPONIN, ED  I-STAT CG4 LACTIC ACID, ED   ____________________________________________  EKG   EKG Interpretation  Date/Time:  Monday November 10 2017 17:48:29 EDT Ventricular Rate:  104 PR Interval:  144 QRS Duration: 150 QT Interval:  402 QTC Calculation: 528 R Axis:   -100 Text Interpretation:  Paced rhythm.  Abnormal ECG No STEMI.  Confirmed by Nanda Quinton [phone]) on 11/10/2017 8:45:22 PM Also confirmed by Nanda Quinton [phone]),  editor Hattie Perch [phone])  on 11/11/2017 7:13:28 AM       ____________________________________________  RADIOLOGY  Dg Chest 2 View  Result Date: 11/10/2017 CLINICAL DATA:  72 year old  male with abdominal and chest pain for several days. Abnormal EKG. Vomiting. EXAM: CHEST - 2 VIEW COMPARISON:  Chest radiographs 08/23/2013 and earlier. FINDINGS: Lower lung volumes with increased bibasilar reticular opacity. Stable left chest cardiac AICD. Stable cardiac size and mediastinal contours. Visualized tracheal air column is within normal limits. No pneumothorax. Upper lobe pulmonary vascularity is normal. Attenuation of upper lung markings raises the possibility of chronic emphysema. No pleural effusion or confluent pulmonary opacity. No acute osseous abnormality identified. Negative visible bowel gas pattern. IMPRESSION: 1. Lower lung volumes with reticular bibasilar opacity. Differential considerations include atelectasis, progressive chronic interstitial lung disease, and less likely viral/atypical respiratory infection. 2. No pleural effusion or other acute cardiopulmonary abnormality. Electronically Signed   By: Genevie Ann M.D.   On: 11/10/2017 18:51   Ct Abdomen Pelvis W Contrast  Result Date: 11/10/2017 CLINICAL DATA:  Abdominal cramping moving to the center of the chest for several days. Abnormal EKG with episode of vomiting last evening and indigestion today. EXAM: CT ABDOMEN AND PELVIS WITH CONTRAST TECHNIQUE: Multidetector CT imaging of the abdomen and pelvis was performed using the standard protocol following bolus administration of intravenous contrast. CONTRAST:  137mL OMNIPAQUE IOHEXOL 300 MG/ML  SOLN COMPARISON:  07/01/2012 FINDINGS: Lower chest: Heart size is normal. Right atrial pacer, right ventricular ICD and coronary sinus leads are noted. No pericardial effusion. Subpleural blebs and bullae noted at each lung base with areas of subpleural fibrosis bilaterally. Hepatobiliary: No focal  liver abnormality is seen. No gallstones, gallbladder wall thickening, or biliary dilatation. Pancreas: Peripancreatic inflammation without ductal dilatation, enhancing mass or pseudocyst formation. Spleen: Normal Adrenals/Urinary Tract: Normal bilateral adrenal glands and kidneys without enhancing mass nor obstructive uropathy. Slightly thick-walled appearance of the urinary bladder is likely due to underdistention. Cystitis is not entirely excluded. Stomach/Bowel: Fluid-filled distention of the stomach may reflect a gastroparesis. No bowel obstruction. Increased colonic stool burden is noted. Normal-appearing appendix is visualized. Vascular/Lymphatic: Patent bifurcating infrarenal aortic stent graft with further decrease in size of the native aneurysm now measuring up to 2.6 cm transverse, previously up to 6.4 cm. Mild-to-moderate atherosclerosis of the suprarenal native aorta. Patent branch vessels without occlusions. Reproductive: Top-normal size prostate. Unremarkable seminal vesicles. Other: Small amount of free fluid in the pelvis. Musculoskeletal: Degenerative change along the dorsal spine. IMPRESSION: 1. Peripancreatic inflammation compatible with acute uncomplicated pancreatitis. Associated small volume of free fluid in the pelvis. 2. Fluid-filled distention of the adjacent stomach compatible with gastroparesis possibly on the basis of adjacent pancreatitis. 3. Further decrease in native infrarenal abdominal aortic aneurysm now down to 2.6 cm transverse from 6.4 cm. Patent indwelling bifurcating aortoiliac stent. Electronically Signed   By: Ashley Royalty M.D.   On: 11/10/2017 21:42   US Abdomen Limited Ruq  Result Date: 11/11/2017 CLINICAL DATA:  Pancreatitis EXAM: ULTRASOUND ABDOMEN LIMITED RIGHT UPPER QUADRANT COMPARISON:  CT from earlier today FINDINGS: Gallbladder: No gallstones or wall thickening visualized. No sonographic Murphy sign noted by sonographer. Common bile duct: Diameter: 3 mm. Limited  coverage that is negative for visible filling defect Liver: No focal lesion identified. Within normal limits in parenchymal echogenicity. Portal vein is patent on color Doppler imaging with normal direction of blood flow towards the liver. IMPRESSION: 1. No evidence of biliary stone. 2. Limited visualization of the common bile duct with normal diameter. Electronically Signed   By: Monte Fantasia M.D.   On: 11/11/2017 01:18    ____________________________________________   PROCEDURES  Procedure(s) performed:   Procedures  CRITICAL  CARE Performed by: Margette Fast Total critical care time: 35 minutes Critical care time was exclusive of separately billable procedures and treating other patients. Critical care was necessary to treat or prevent imminent or life-threatening deterioration. Critical care was time spent personally by me on the following activities: development of treatment plan with patient and/or surrogate as well as nursing, discussions with consultants, evaluation of patient's response to treatment, examination of patient, obtaining history from patient or surrogate, ordering and performing treatments and interventions, ordering and review of laboratory studies, ordering and review of radiographic studies, pulse oximetry and re-evaluation of patient's condition.  Nanda Quinton, MD Emergency Medicine  ____________________________________________   INITIAL IMPRESSION / ASSESSMENT AND PLAN / ED COURSE  Pertinent labs & imaging results that were available during my care of the patient were reviewed by me and considered in my medical decision making (see chart for details).  Patient pulled from the waiting room for me to examine.  He has a white count of 36,000 and lipase greater than 1000.  He is afebrile here with normal blood pressures.  He is actually very well-appearing on my initial evaluation.  He does have some epigastric abdominal pain on exam.  Plan for IV placement and  CT scan.  Unfortunately there are no acute care beds for the patient be placed in at this time and I am unable to start IV fluids or antibiotics but will try to get the patient a bed as soon as possible.   CT shows uncomplicated pancreatitis. With significant leukocytosis and elevated lactate will start Zosyn. Plan for IVF and NPO status at this time. Discussed results and plan with the patient.   Discussed patient's case with Hospitalist, Dr. Blaine Hamper to request admission. Patient and family (if present) updated with plan. Care transferred to Hospitalist service.  I reviewed all nursing notes, vitals, pertinent old records, EKGs, labs, imaging (as available).  ____________________________________________  FINAL CLINICAL IMPRESSION(S) / ED DIAGNOSES  Final diagnoses:  Acute pancreatitis, unspecified complication status, unspecified pancreatitis type  Pancreatitis     MEDICATIONS GIVEN DURING THIS VISIT:  Medications  iopamidol (ISOVUE-300) 61 % injection 100 mL (has no administration in time range)  0.9 %  sodium chloride infusion ( Intravenous New Bag/Given 11/11/17 0000)  morphine 4 MG/ML injection 2 mg (has no administration in time range)  ondansetron (ZOFRAN) injection 4 mg (has no administration in time range)  insulin aspart (novoLOG) injection 0-9 Units (has no administration in time range)  hydrALAZINE (APRESOLINE) tablet 25 mg (25 mg Oral Given 11/11/17 0656)  hydrALAZINE (APRESOLINE) injection 5 mg (has no administration in time range)  zolpidem (AMBIEN) tablet 5 mg (has no administration in time range)  enoxaparin (LOVENOX) injection 40 mg (has no administration in time range)  polyethylene glycol (MIRALAX / GLYCOLAX) packet 17 g (has no administration in time range)  piperacillin-tazobactam (ZOSYN) IVPB 3.375 g (3.375 g Intravenous New Bag/Given 11/11/17 0656)  amLODipine (NORVASC) tablet 5 mg (5 mg Oral Given 11/11/17 0000)  aspirin EC tablet 81 mg (has no administration in time  range)  carvedilol (COREG) tablet 25 mg (has no administration in time range)  cholecalciferol (VITAMIN D) tablet 1,200 Units (has no administration in time range)  multivitamin with minerals tablet 1 tablet (has no administration in time range)  simvastatin (ZOCOR) tablet 40 mg (40 mg Oral Given 11/11/17 0000)  vitamin B-12 (CYANOCOBALAMIN) tablet 500 mcg (has no administration in time range)  insulin glargine (LANTUS) injection 10 Units (has no administration in  time range)  insulin glargine (LANTUS) injection 20 Units (has no administration in time range)  iohexol (OMNIPAQUE) 300 MG/ML solution 100 mL (100 mLs Intravenous Contrast Given 11/10/17 2116)  sodium chloride 0.9 % bolus 500 mL (0 mLs Intravenous Stopped 11/11/17 0039)  piperacillin-tazobactam (ZOSYN) IVPB 3.375 g (0 g Intravenous Stopped 11/10/17 2300)    Note:  This document was prepared using Dragon voice recognition software and may include unintentional dictation errors.  Nanda Quinton, MD Emergency Medicine    Adelis Docter, [facility], MD 11/11/17 [phone]

## 2017-11-10 NOTE — ED Notes (Signed)
Pt evaluated by Dr. Laverta [place], IV est, lactic drawn, CT ordered.  Pacemaker interrogated

## 2017-11-10 NOTE — ED Notes (Signed)
Attempted report x1. 

## 2017-11-10 NOTE — Progress Notes (Signed)
Pharmacy Antibiotic Note  Luke [name] is a 72 y.o. male who presented on 11/10/2017 with intermittent epigastric abdominal pain radiating to chest and concern for acute pancreatitis. Pharmacy has been consulted for Zosyn dosing.  The patient received a dose of Zosyn 3.375g at 2230 this evening. WBC 36.8, lipase 1043, LA 1.95  Plan: - Continue Zosyn 3.375g IV every 8 hours (infused over 4 hours) - Will continue to follow renal function, culture results, LOT, and antibiotic de-escalation plans   Height: 6\' 3"  (190.5 cm) Weight: 188 lb (85.3 kg) IBW/kg (Calculated) : 84.5  Temp (24hrs), Avg:98.9 F (37.2 C), Min:98.7 F (37.1 C), Max:99 F (37.2 C)  Recent Labs  Lab 11/10/17 1805 11/10/17 2059  WBC 36.8*  --   CREATININE 1.38*  --   LATICACIDVEN  --  1.95*    Estimated Creatinine Clearance: 58.7 mL/min (A) (by C-G formula based on SCr of 1.38 mg/dL (H)).    Allergies  Allergen Reactions  . Aldactone [Spironolactone] Other (See Comments)    Hyperkalemia  . Codeine Rash and Hives  . Januvia [Sitagliptin] Other (See Comments)    Diarrhea and heart racing    Antimicrobials this admission: Zosyn 7/8 >>  Dose adjustments this admission: n/a  Microbiology results: 7/8 BCx >>  Thank you for allowing pharmacy to be a part of this patient's care.  Alycia Rossetti, PharmD, BCPS Clinical Pharmacist Pager: [phone] 11/10/2017 11:09 PM

## 2017-11-10 NOTE — H&P (Signed)
History and Physical    Luke [name] BSJ:[id number] DOB: [birth year] DOA: 11/10/2017  Referring MD/NP/PA:   PCP: Tonia Ghent, MD   Patient coming from:  The patient is coming from home.  At baseline, pt is independent for most of ADL.   Chief Complaint: Abdominal pain, nausea, vomiting  HPI: Luke [name] is a 72 y.o. male with medical history significant of hypertension, hyperlipidemia, diabetes mellitus, repaired AAA, atrial fibrillation on not on anticoagulants, third-degree AV block with ICD placement, left bundle blockade, CAD, PAD, who presents with abdominal pain  Patient states that he had 3 days of diarrhea, which has resolved, then he started having abdominal pain 3 days ago.  It is located in the central abdomen, constant, sharp, 7 out of 10 severity, radiating to the lower chest, associated with nausea and vomiting.  He vomited once last night.  Currently patient does not have diarrhea. Patient denies chest pain, cough, fever or chills.  He states that he does not have chest pain currently.  No symptoms of UTI or unilateral weakness. Pt states that he started taking lantus 2 weeks ago for uncontrolled DM. He i lisinoprils taking since the year 2009.  Patient drinks beer occasionally.  ED Course: pt was found to have WBC 36.8, lipase 1043, lactic acid 1.95, stable renal function, temperature normal, tachycardia, oxygen satting 93 to 94% on room air.  Chest x-ray ordered bilateral basilar reticular opacity.  CT abdomen/pelvis showed acute pancreatitis.  Patient is place on tele bed for obs.  Review of Systems:   General: no fevers, chills, no body weight gain, has poor appetite, has fatigue HEENT: no blurry vision, hearing changes or sore throat Respiratory: no dyspnea, coughing, wheezing CV: no chest pain, no palpitations GI: has nausea, vomiting, abdominal pain, no diarrhea, constipation GU: no dysuria, burning on urination, increased urinary frequency, hematuria  Ext:  no leg edema Neuro: no unilateral weakness, numbness, or tingling, no vision change or hearing loss Skin: no rash, no skin tear. MSK: No muscle spasm, no deformity, no limitation of range of movement in spin Heme: No easy bruising.  Travel history: No recent long distant travel.  Allergy:  Allergies  Allergen Reactions  . Aldactone [Spironolactone] Other (See Comments)    Hyperkalemia  . Codeine Rash and Hives  . Januvia [Sitagliptin] Other (See Comments)    Diarrhea and heart racing    Past Medical History:  Diagnosis Date  . AAA (abdominal aortic aneurysm) ([place]) 2011   Per vascular surgery  . Anxiety    no med. in use for anxiety but pt. speaks openly of his stress & anxious feelings regarding impending surgery    . Atrial fibrillation ([place])   . Automatic implantable cardioverter-defibrillator in situ   . CAD (coronary artery disease)    Presumed CAD with nuclear scan October 09, 2011,  large anteroseptal MI and inferior MI. Catheterization scheduled October 15, 2011  . Cardiomyopathy [facility])    Nuclear, October 09, 2011, EF 30%, multiple focal wall motion abnormalities  . Diabetes mellitus    type II  . Drug therapy    Hyperkalemia with spironolactone  . Ejection fraction < 50%    EF 30%, nuclear, October 09, 2011  . Ejection fraction < 50%    EF previously 30%  //   EF 30-35%, echo, July 14, 2012, severe diffuse hypokinesis, PA pressure 43 mm mercury  . Fall due to ice or snow Feb.  19, 2015  . HLD (hyperlipidemia)   .  Hypertension    white coat HTN-- often elevated in office and controlled on outside checks.  . ICD (implantable cardioverter-defibrillator) in place    CRT-D placed March, 2014 complete heart block and k dysfunction  . LBBB (left bundle branch block)    LBBB on EKG October 11, 2011,  no prior EKG has been done  . Low testosterone    Hx of  . Myocardial infarction ([place])   . Pacemaker   . PAD (peripheral artery disease) ([place])     Past Surgical History:  Procedure  Laterality Date  . ABDOMINAL AORTAGRAM N/A 10/28/2011   Procedure: ABDOMINAL [name];  Surgeon: Angelia Mould, MD;  Location: [facility] CATH LAB;  Service: Cardiovascular;  Laterality: N/A;  . ABDOMINAL AORTIC ANEURYSM REPAIR     EVAR   . BI-VENTRICULAR IMPLANTABLE CARDIOVERTER DEFIBRILLATOR N/A 07/16/2012   Procedure: BI-VENTRICULAR IMPLANTABLE CARDIOVERTER DEFIBRILLATOR  (CRT-D);  Surgeon: Deboraha Sprang, MD;  Location: [facility] CATH LAB;  Service: Cardiovascular;  Laterality: N/A;  . CARDIAC CATHETERIZATION    . COLONOSCOPY    . HERNIA REPAIR  Jan. 9, 2015  . INSERTION OF MESH N/A 05/14/2013   Procedure: INSERTION OF MESH;  Surgeon: Adin Hector, MD;  Location: [place];  Service: General;  Laterality: N/A;  . LOWER EXTREMITY ANGIOGRAM Bilateral 10/28/2011   Procedure: LOWER EXTREMITY ANGIOGRAM;  Surgeon: Angelia Mould, MD;  Location: [facility] CATH LAB;  Service: Cardiovascular;  Laterality: Bilateral;  . PACEMAKER INSERTION  07-16-12   pacemaker/defibrilator  . POSTERIOR CERVICAL FUSION/FORAMINOTOMY N/A 06/09/2014   Procedure: Laminectomy - Cervical two-Cervcial four posterior cervical instrumented fusion Cervical two-cervical four;  Surgeon: Eustace Moore, MD;  Location: [place] NEURO ORS;  Service: Neurosurgery;  Laterality: N/A;  posterior   . TONSILLECTOMY     as a child   . UMBILICAL HERNIA REPAIR N/A 05/14/2013   Procedure: LAPAROSCOPIC exploration and repair of hernia in abdominal ;  Surgeon: Adin Hector, MD;  Location: [place];  Service: General;  Laterality: N/A;    Social History:  reports that he quit smoking about 13 years ago. His smoking use included cigarettes. He has a 80.00 pack-year smoking history. He has never used smokeless tobacco. He reports that he drinks alcohol. He reports that he does not use drugs.  Family History:  Family History  Problem Relation Age of Onset  . Hypertension Mother   . Stroke Mother   . Hyperlipidemia Mother   . Diabetes Sister   . Heart disease  Sister        Before age 29  . Hypertension Sister   . Hyperlipidemia Sister   . Heart attack Sister   . Cancer Father        Lung  . Hypertension Son   . Colon cancer Neg Hx   . Prostate cancer Neg Hx      Prior to Admission medications   Medication Sig Start Date End Date Taking? Authorizing Provider  amLODipine (NORVASC) 2.5 MG tablet TAKE 1 TABLET BY MOUTH ONCE DAILY 08/19/17   Jerline Pain, MD  aspirin EC 81 MG tablet Take 81 mg by mouth daily.    [provider]  carvedilol (COREG) 25 MG tablet TAKE 1 TABLET BY MOUTH TWICE A DAY WITH MEALS 07/09/17   Jerline Pain, MD  Cholecalciferol (VITAMIN D PO) Take 1,200 Int'l Units by mouth daily.    [provider]  glipiZIDE (GLUCOTROL) 5 MG tablet Take 2 tablets (10 mg total) by mouth  2 (two) times daily before a meal. 11/21/16   Tonia Ghent, MD  glucose blood (ACCU-CHEK AVIVA PLUS) test strip CHECK BLOOD SUGAR TWICE A DAY AND AS DIRECTED. DX. E11.59 09/04/17   Tonia Ghent, MD  Insulin Glargine (LANTUS SOLOSTAR) 100 UNIT/ML Solostar Pen Inject 5-20 Units into the skin daily at 10 pm. 07/10/17   Tonia Ghent, MD  Insulin Pen Needle (PENTIPS) 31G X 5 MM MISC Use daily with insulin pen 07/10/17   Tonia Ghent, MD  lisinopril (PRINIVIL,ZESTRIL) 20 MG tablet Take 1 tablet (20 mg total) by mouth 2 (two) times daily. 11/21/16   Tonia Ghent, MD  metFORMIN (GLUCOPHAGE) 500 MG tablet take 2 tablets by mouth twice a day with food 11/21/16   Tonia Ghent, MD  Multiple Vitamin (MULTIVITAMIN) tablet Take 1 tablet by mouth daily.     [provider]  sildenafil (REVATIO) 20 MG tablet TAKE THREE TO FOUR TABLETS BY MOUTH DAILY AS NEEDED 10/11/17   Tonia Ghent, MD  simvastatin (ZOCOR) 40 MG tablet Take 1 tablet (40 mg total) by mouth at bedtime. 11/21/16   Tonia Ghent, MD  vitamin B-12 (CYANOCOBALAMIN) 500 MCG tablet Take 500 mcg by mouth daily.    [provider]    Physical Exam: Vitals:    11/10/17 1749 11/10/17 1757 11/10/17 1959 11/10/17 2201  BP: (!) 138/94  (!) 152/83 (!) 152/80  Pulse: (!) 106  (!) 106 (!) 104  Resp: 16  20 18   Temp: 98.7 F (37.1 C)  99 F (37.2 C) 98.9 F (37.2 C)  TempSrc: Oral  Oral Oral  SpO2: 98%  93% 94%  Weight:  85.3 kg (188 lb)    Height:  6\' 3"  (1.905 m)     General: Not in acute distress HEENT:       Eyes: PERRL, EOMI, no scleral icterus.       ENT: No discharge from the ears and nose, no pharynx injection, no tonsillar enlargement.        Neck: No JVD, no bruit, no mass felt. Heme: No neck lymph node enlargement. Cardiac: S1/S2, RRR, No murmurs, No gallops or rubs. Respiratory: No rales, wheezing, rhonchi or rubs. GI: Soft, nondistended, has tenderness in central abdomen, no rebound pain, no organomegaly, BS present. GU: No hematuria Ext: No pitting leg edema bilaterally. 2+DP/PT pulse bilaterally. Musculoskeletal: No joint deformities, No joint redness or warmth, no limitation of ROM in spin. Skin: No rashes.  Neuro: Alert, oriented X3, cranial nerves II-XII grossly intact, moves all extremities normally.   Labs on Admission: I have personally reviewed following labs and imaging studies  CBC: Recent Labs  Lab 11/10/17 1805  WBC 36.8*  HGB 16.0  HCT 46.0  MCV 94.3  PLT 016   Basic Metabolic Panel: Recent Labs  Lab 11/10/17 1805  NA 133*  K 4.7  CL 94*  CO2 22  GLUCOSE 318*  BUN 30*  CREATININE 1.38*  CALCIUM 9.9   GFR: Estimated Creatinine Clearance: 58.7 mL/min (A) (by C-G formula based on SCr of 1.38 mg/dL (H)). Liver Function Tests: Recent Labs  Lab 11/10/17 1805  AST 44*  ALT 67*  ALKPHOS 84  BILITOT 1.3*  PROT 7.3  ALBUMIN 4.0   Recent Labs  Lab 11/10/17 1805  LIPASE 1,043*   No results for input(s): AMMONIA in the last 168 hours. Coagulation Profile: No results for input(s): INR, PROTIME in the last 168 hours. Cardiac Enzymes: No results for  input(s): CKTOTAL, CKMB, CKMBINDEX, TROPONINI  in the last 168 hours. BNP (last 3 results) No results for input(s): PROBNP in the last 8760 hours. HbA1C: No results for input(s): HGBA1C in the last 72 hours. CBG: Recent Labs  Lab 11/10/17 2050  GLUCAP 303*   Lipid Profile: No results for input(s): CHOL, HDL, LDLCALC, TRIG, CHOLHDL, LDLDIRECT in the last 72 hours. Thyroid Function Tests: No results for input(s): TSH, T4TOTAL, FREET4, T3FREE, THYROIDAB in the last 72 hours. Anemia Panel: No results for input(s): VITAMINB12, FOLATE, FERRITIN, TIBC, IRON, RETICCTPCT in the last 72 hours. Urine analysis:    Component Value Date/Time   COLORURINE YELLOW 11/27/2011 1102   APPEARANCEUR CLEAR 11/27/2011 1102   LABSPEC 1.008 11/27/2011 1102   PHURINE 5.5 11/27/2011 1102   GLUCOSEU 100 (A) 11/27/2011 1102   HGBUR NEGATIVE 11/27/2011 1102   BILIRUBINUR NEGATIVE 11/27/2011 1102   KETONESUR NEGATIVE 11/27/2011 1102   PROTEINUR NEGATIVE 11/27/2011 1102   UROBILINOGEN 0.2 11/27/2011 1102   NITRITE NEGATIVE 11/27/2011 1102   LEUKOCYTESUR NEGATIVE 11/27/2011 1102   Sepsis Labs: @LABRCNTIP (procalcitonin:4,lacticidven:4) )No results found for this or any previous visit (from the past 240 hour(s)).   Radiological Exams on Admission: Dg Chest 2 View  Result Date: 11/10/2017 CLINICAL DATA:  72 year old male with abdominal and chest pain for several days. Abnormal EKG. Vomiting. EXAM: CHEST - 2 VIEW COMPARISON:  Chest radiographs 08/23/2013 and earlier. FINDINGS: Lower lung volumes with increased bibasilar reticular opacity. Stable left chest cardiac AICD. Stable cardiac size and mediastinal contours. Visualized tracheal air column is within normal limits. No pneumothorax. Upper lobe pulmonary vascularity is normal. Attenuation of upper lung markings raises the possibility of chronic emphysema. No pleural effusion or confluent pulmonary opacity. No acute osseous abnormality identified. Negative visible bowel gas pattern. IMPRESSION: 1. Lower lung  volumes with reticular bibasilar opacity. Differential considerations include atelectasis, progressive chronic interstitial lung disease, and less likely viral/atypical respiratory infection. 2. No pleural effusion or other acute cardiopulmonary abnormality. Electronically Signed   By: Genevie Ann M.D.   On: 11/10/2017 18:51   Ct Abdomen Pelvis W Contrast  Result Date: 11/10/2017 CLINICAL DATA:  Abdominal cramping moving to the center of the chest for several days. Abnormal EKG with episode of vomiting last evening and indigestion today. EXAM: CT ABDOMEN AND PELVIS WITH CONTRAST TECHNIQUE: Multidetector CT imaging of the abdomen and pelvis was performed using the standard protocol following bolus administration of intravenous contrast. CONTRAST:  148mL OMNIPAQUE IOHEXOL 300 MG/ML  SOLN COMPARISON:  07/01/2012 FINDINGS: Lower chest: Heart size is normal. Right atrial pacer, right ventricular ICD and coronary sinus leads are noted. No pericardial effusion. Subpleural blebs and bullae noted at each lung base with areas of subpleural fibrosis bilaterally. Hepatobiliary: No focal liver abnormality is seen. No gallstones, gallbladder wall thickening, or biliary dilatation. Pancreas: Peripancreatic inflammation without ductal dilatation, enhancing mass or pseudocyst formation. Spleen: Normal Adrenals/Urinary Tract: Normal bilateral adrenal glands and kidneys without enhancing mass nor obstructive uropathy. Slightly thick-walled appearance of the urinary bladder is likely due to underdistention. Cystitis is not entirely excluded. Stomach/Bowel: Fluid-filled distention of the stomach may reflect a gastroparesis. No bowel obstruction. Increased colonic stool burden is noted. Normal-appearing appendix is visualized. Vascular/Lymphatic: Patent bifurcating infrarenal aortic stent graft with further decrease in size of the native aneurysm now measuring up to 2.6 cm transverse, previously up to 6.4 cm. Mild-to-moderate  atherosclerosis of the suprarenal native aorta. Patent branch vessels without occlusions. Reproductive: Top-normal size prostate. Unremarkable seminal vesicles. Other: Small amount  of free fluid in the pelvis. Musculoskeletal: Degenerative change along the dorsal spine. IMPRESSION: 1. Peripancreatic inflammation compatible with acute uncomplicated pancreatitis. Associated small volume of free fluid in the pelvis. 2. Fluid-filled distention of the adjacent stomach compatible with gastroparesis possibly on the basis of adjacent pancreatitis. 3. Further decrease in native infrarenal abdominal aortic aneurysm now down to 2.6 cm transverse from 6.4 cm. Patent indwelling bifurcating aortoiliac stent. Electronically Signed   By: Ashley Royalty M.D.   On: 11/10/2017 21:42     EKG: Independently reviewed.  Paced rhythm, QTC 525.  Assessment/Plan Principal Problem:   Acute pancreatitis Active Problems:   Type 2 diabetes mellitus with vascular disease (HCC)   Essential hypertension, benign   CAD (coronary artery disease)   HLD (hyperlipidemia)   CKD (chronic kidney disease), stage III (HCC)   SIRS (systemic inflammatory response syndrome) (HCC)   Pancreatitis   Acute pancreatitis: Lipase 1043. CT abdomen/pelvis showed acute pancreatitis.   -will admit to med-surg bed as inpt -NPO for pancreatitis -IVF: 500 mLNS and then at 125 cc/hr -IV morphine for pain control, IV zofran for nausea -abdominal ultrasound to re-evaluate pancreas, gallbladder, and bile ducts  -check lipid panel to rule out triglyceridemia.  Type 2 diabetes mellitus with vascular disease ([place]): Last A1c 8.7 on  07/07/17, poorly controled. Patient is taking metformin, glipizide and Lantus at home -will decrease Lantus dose from 20 to 14 units daily -SSI  Essential hypertension, benign: -Hold lisinopril due to pancreatitis - continue amlodipine,  increased dose from 2.5 to 5 mg daily - continue Corege -Start oral hydralazine 25 mg 3  times daily -IV hydralazine.  CAD: No CP -continue aspirin, Zocor, Coreg  HLD (hyperlipidemia) -Zocor  CKD (chronic kidney disease), stage III ([place]): Baseline creatinine 1.2-1.3.: stable.  His creatinine is 1.38, BUN 30, close to baseline. - Follow-up BMP -IV fluid as above  SIRS (systemic inflammatory response syndrome) [facility]): Patient meets criteria for Sirs with leukocytosis, with WBC 36.8, tachycardia and tachypnea, no source of infection identified.  Possibly due to pancreatitis.  Given high WBC, IV Zosyn was started in ED, will continue. -Follow-up urinalysis -Blood culture -IV fluid as above -will get Procalcitonin and trend lactic acid levels per sepsis protocol.   DVT ppx:  SQ Lovenox Code Status: Full code Family Communication: Yes, patient's wife at bed side Disposition Plan:  Anticipate discharge back to previous home environment Consults called:  none Admission status: Obs / tele      Date of Service 11/10/2017    Ivor Costa [facility] Pager [phone]  If 7PM-7AM, please contact night-coverage [email or web address] Password [facility] 11/10/2017, 11:34 PM

## 2017-11-10 NOTE — ED Triage Notes (Addendum)
Pt endorses abd cramping moving up into the central chest for several days. Pt was sent here by [facility] family practice for abnormal ekg. Pt had episode of vomiting last night and indigestion today. Denies shob or dizziness. Pt has pacemaker/defibrillator

## 2017-11-10 NOTE — Telephone Encounter (Signed)
The only way I can add him on is by seeing him at 1PM and working through lunch.  If they can happen, then go ahead.  If he has emergent sx with severe pain then needs ER.  Thanks.

## 2017-11-11 ENCOUNTER — Observation Stay (HOSPITAL_COMMUNITY): Payer: Medicare Other

## 2017-11-11 ENCOUNTER — Encounter (HOSPITAL_COMMUNITY): Payer: Self-pay | Admitting: Surgery

## 2017-11-11 ENCOUNTER — Other Ambulatory Visit: Payer: Self-pay

## 2017-11-11 DIAGNOSIS — I2510 Atherosclerotic heart disease of native coronary artery without angina pectoris: Secondary | ICD-10-CM | POA: Diagnosis present

## 2017-11-11 DIAGNOSIS — Z888 Allergy status to other drugs, medicaments and biological substances status: Secondary | ICD-10-CM | POA: Diagnosis not present

## 2017-11-11 DIAGNOSIS — K851 Biliary acute pancreatitis without necrosis or infection: Secondary | ICD-10-CM | POA: Diagnosis not present

## 2017-11-11 DIAGNOSIS — I447 Left bundle-branch block, unspecified: Secondary | ICD-10-CM | POA: Diagnosis present

## 2017-11-11 DIAGNOSIS — Z801 Family history of malignant neoplasm of trachea, bronchus and lung: Secondary | ICD-10-CM | POA: Diagnosis not present

## 2017-11-11 DIAGNOSIS — E785 Hyperlipidemia, unspecified: Secondary | ICD-10-CM | POA: Diagnosis not present

## 2017-11-11 DIAGNOSIS — E1159 Type 2 diabetes mellitus with other circulatory complications: Secondary | ICD-10-CM | POA: Diagnosis not present

## 2017-11-11 DIAGNOSIS — Z87891 Personal history of nicotine dependence: Secondary | ICD-10-CM | POA: Diagnosis not present

## 2017-11-11 DIAGNOSIS — R6510 Systemic inflammatory response syndrome (SIRS) of non-infectious origin without acute organ dysfunction: Secondary | ICD-10-CM | POA: Diagnosis present

## 2017-11-11 DIAGNOSIS — Z885 Allergy status to narcotic agent status: Secondary | ICD-10-CM | POA: Diagnosis not present

## 2017-11-11 DIAGNOSIS — I129 Hypertensive chronic kidney disease with stage 1 through stage 4 chronic kidney disease, or unspecified chronic kidney disease: Secondary | ICD-10-CM | POA: Diagnosis present

## 2017-11-11 DIAGNOSIS — E1169 Type 2 diabetes mellitus with other specified complication: Secondary | ICD-10-CM | POA: Diagnosis present

## 2017-11-11 DIAGNOSIS — Z95810 Presence of automatic (implantable) cardiac defibrillator: Secondary | ICD-10-CM | POA: Diagnosis not present

## 2017-11-11 DIAGNOSIS — K859 Acute pancreatitis without necrosis or infection, unspecified: Secondary | ICD-10-CM | POA: Diagnosis not present

## 2017-11-11 DIAGNOSIS — N183 Chronic kidney disease, stage 3 (moderate): Secondary | ICD-10-CM | POA: Diagnosis not present

## 2017-11-11 DIAGNOSIS — E1151 Type 2 diabetes mellitus with diabetic peripheral angiopathy without gangrene: Secondary | ICD-10-CM | POA: Diagnosis present

## 2017-11-11 DIAGNOSIS — I252 Old myocardial infarction: Secondary | ICD-10-CM | POA: Diagnosis not present

## 2017-11-11 DIAGNOSIS — Z833 Family history of diabetes mellitus: Secondary | ICD-10-CM | POA: Diagnosis not present

## 2017-11-11 DIAGNOSIS — Z8349 Family history of other endocrine, nutritional and metabolic diseases: Secondary | ICD-10-CM | POA: Diagnosis not present

## 2017-11-11 DIAGNOSIS — I10 Essential (primary) hypertension: Secondary | ICD-10-CM | POA: Diagnosis not present

## 2017-11-11 DIAGNOSIS — I4891 Unspecified atrial fibrillation: Secondary | ICD-10-CM | POA: Diagnosis present

## 2017-11-11 DIAGNOSIS — K8500 Idiopathic acute pancreatitis without necrosis or infection: Secondary | ICD-10-CM | POA: Diagnosis not present

## 2017-11-11 DIAGNOSIS — Z823 Family history of stroke: Secondary | ICD-10-CM | POA: Diagnosis not present

## 2017-11-11 DIAGNOSIS — Z8249 Family history of ischemic heart disease and other diseases of the circulatory system: Secondary | ICD-10-CM | POA: Diagnosis not present

## 2017-11-11 DIAGNOSIS — I255 Ischemic cardiomyopathy: Secondary | ICD-10-CM | POA: Diagnosis present

## 2017-11-11 DIAGNOSIS — Z981 Arthrodesis status: Secondary | ICD-10-CM | POA: Diagnosis not present

## 2017-11-11 DIAGNOSIS — Z7982 Long term (current) use of aspirin: Secondary | ICD-10-CM | POA: Diagnosis not present

## 2017-11-11 DIAGNOSIS — E1122 Type 2 diabetes mellitus with diabetic chronic kidney disease: Secondary | ICD-10-CM | POA: Diagnosis present

## 2017-11-11 LAB — LIPID PANEL
Cholesterol: 132 mg/dL (ref 0–200)
HDL: 42 mg/dL (ref >40)
LDL Cholesterol: 63 mg/dL (ref 0–99)
Total CHOL/HDL Ratio: 3.1 RATIO
Triglycerides: 136 mg/dL (ref <150)
VLDL: 27 mg/dL (ref 0–40)

## 2017-11-11 LAB — GLUCOSE, CAPILLARY
Glucose-Capillary: 354 mg/dL — ABNORMAL HIGH (ref 70–99)
Glucose-Capillary: 338 mg/dL — ABNORMAL HIGH (ref 70–99)
Glucose-Capillary: 324 mg/dL — ABNORMAL HIGH (ref 70–99)

## 2017-11-11 LAB — COMPREHENSIVE METABOLIC PANEL
ALT: 54 U/L — ABNORMAL HIGH (ref 0–44)
AST: 32 U/L (ref 15–41)
Albumin: 3.6 g/dL (ref 3.5–5.0)
Alkaline Phosphatase: 74 U/L (ref 38–126)
Anion gap: 10 (ref 5–15)
BUN: 24 mg/dL — ABNORMAL HIGH (ref 8–23)
CO2: 29 mmol/L (ref 22–32)
Calcium: 9.2 mg/dL (ref 8.9–10.3)
Chloride: 93 mmol/L — ABNORMAL LOW (ref 98–111)
Creatinine, Ser: 1.24 mg/dL (ref 0.61–1.24)
GFR calc Af Amer: >60 mL/min (ref >60)
GFR calc non Af Amer: 57 mL/min — ABNORMAL LOW (ref >60)
Glucose, Bld: 331 mg/dL — ABNORMAL HIGH (ref 70–99)
Potassium: 4.1 mmol/L (ref 3.5–5.1)
Sodium: 132 mmol/L — ABNORMAL LOW (ref 135–145)
Total Bilirubin: 1.9 mg/dL — ABNORMAL HIGH (ref 0.3–1.2)
Total Protein: 6.8 g/dL (ref 6.5–8.1)

## 2017-11-11 LAB — CBC
HCT: 43.5 % (ref 39.0–52.0)
Hemoglobin: 15.2 g/dL (ref 13.0–17.0)
MCH: 32.8 pg (ref 26.0–34.0)
MCHC: 34.9 g/dL (ref 30.0–36.0)
MCV: 93.8 fL (ref 78.0–100.0)
Platelets: 236 10*3/uL (ref 150–400)
RBC: 4.64 MIL/uL (ref 4.22–5.81)
RDW: 12.1 % (ref 11.5–15.5)
WBC: 39.3 10*3/uL — ABNORMAL HIGH (ref 4.0–10.5)

## 2017-11-11 LAB — LACTIC ACID, PLASMA: Lactic Acid, Venous: 1.4 mmol/L (ref 0.5–1.9)

## 2017-11-11 LAB — PROCALCITONIN: Procalcitonin: 0.32 ng/mL

## 2017-11-11 MED ORDER — INSULIN GLARGINE 100 UNIT/ML ~~LOC~~ SOLN
10.0000 [IU] | Freq: Once | SUBCUTANEOUS | Status: AC
  Administered 2017-11-11: 10 [IU] via SUBCUTANEOUS
  Filled 2017-11-11: qty 0.1

## 2017-11-11 MED ORDER — ATORVASTATIN CALCIUM 20 MG PO TABS
20.0000 mg | ORAL_TABLET | Freq: Every day | ORAL | Status: DC
  Administered 2017-11-11 – 2017-11-13 (×3): 20 mg via ORAL
  Filled 2017-11-11 (×3): qty 1

## 2017-11-11 MED ORDER — INSULIN GLARGINE 100 UNIT/ML ~~LOC~~ SOLN
20.0000 [IU] | Freq: Every day | SUBCUTANEOUS | Status: DC
  Administered 2017-11-11: 20 [IU] via SUBCUTANEOUS
  Filled 2017-11-11: qty 0.2

## 2017-11-11 NOTE — Progress Notes (Addendum)
[facility] PROGRESS NOTE  Luke [name] HGD:[id number] DOB: [birth year] DOA: 11/10/2017   PCP: Tonia Ghent, MD     Assessment/Plan: Principal Problem:   Acute pancreatitis Active Problems:   Type 2 diabetes mellitus with vascular disease ([place])   Essential hypertension, benign   CAD (coronary artery disease)   HLD (hyperlipidemia)   CKD (chronic kidney disease), stage III (HCC)   SIRS (systemic inflammatory response syndrome) ([place])   Pancreatitis  72 y.o. male with medical history significant of hypertension, hyperlipidemia, diabetes mellitus, repaired AAA, atrial fibrillation on not on anticoagulants, third-degree AV block with ICD placement, left bundle blockade, CAD, PAD, who presents with abdominal pain. CT abdomen/pelvis showed acute pancreatitis.  Patient is place on tele bed for  inpatient   Assessment/Plan  Acute pancreatitis: Lipase 1043. CT abdomen/pelvis showed acute pancreatitis.  - was npo for pancreatitis, now started on clear liquids , will not escalate  -IVF: 500 mLNS and then at 150 cc/hr -IV morphine for pain control, IV zofran for nausea -abdominal ultrasound to re-evaluate pancreas,  Limited visualization of the common bile duct with normal diameter. triglyceridemia  136  Type 2 diabetes mellitus with vascular disease ([place]): Last A1c 8.7 on  07/07/17, poorly controled. Patient is taking metformin, glipizide and Lantus at home -increased  Lantus to  20   units daily -SSI  Essential hypertension, benign: -Hold lisinopril due to pancreatitis - continue amlodipine,   - continue Coreg -Start oral hydralazine 25 mg 3 times daily -IV hydralazine.  CAD: No CP -continue aspirin, Zocor, Coreg  HLD (hyperlipidemia) -Zocor  CKD (chronic kidney disease), stage III ([place]): Baseline creatinine 1.2-1.3.:  stable.  His creatinine is 1.38, BUN 30, close to baseline. - Follow-up BMP -IV fluid as above  SIRS (systemic inflammatory response  syndrome) [facility]): Patient meets criteria for Sirs with leukocytosis, with WBC 36.8, tachycardia and tachypnea, no source of infection identified.  Possibly due to pancreatitis.  Given high WBC, IV Zosyn was started in ED, will continue. -Follow-up urinalysis -Blood culture -IV fluid as above -will get Procalcitonin and trend lactic acid levels per sepsis protocol.    DVT prophylaxsis lovenox   Code Status:  Full code     Family Communication: Discussed in detail with the patient, all imaging results, lab results explained to the patient   Disposition Plan:  Likely 2-3 days pending improvement     Consultants:  None   Procedures:  None   Antibiotics: Anti-infectives (From admission, onward)   Start     Dose/Rate Route Frequency Ordered Stop   11/11/17 0600  piperacillin-tazobactam (ZOSYN) IVPB 3.375 g     3.375 g 12.5 mL/hr over 240 Minutes Intravenous Every 8 hours 11/10/17 2313     11/10/17 2230  piperacillin-tazobactam (ZOSYN) IVPB 3.375 g     3.375 g 100 mL/hr over 30 Minutes Intravenous  Once 11/10/17 2223 11/10/17 2300         HPI/Subjective: Would like to advance to clear liquids  Still has some nausea ,pain subsided   Objective: Vitals:   11/11/17 0535 11/11/17 0600 11/11/17 0759 11/11/17 1129  BP: (!) 196/97 (!) 178/82 (!) 162/87 (!) 180/84  Pulse: (!) 101  91 92  Resp: 18  18 20   Temp: 98.1 F (36.7 C)  98.9 F (37.2 C) 98.4 F (36.9 C)  TempSrc: Oral  Oral Oral  SpO2: 92%  92% 93%  Weight:      Height:  Intake/Output Summary (Last 24 hours) at 11/11/2017 1154 Last data filed at 11/11/2017 0816 Gross per 24 hour  Intake 697.92 ml  Output 500 ml  Net 197.92 ml    Exam:  Examination:  General exam: Appears calm and comfortable  Respiratory system: Clear to auscultation. Respiratory effort normal. Cardiovascular system: S1 & S2 heard, RRR. No JVD, murmurs, rubs, gallops or clicks. No pedal edema. Gastrointestinal system: Abdomen is  nondistended, soft and nontender. No organomegaly or masses felt. Normal bowel sounds heard. Central nervous system: Alert and oriented. No focal neurological deficits. Extremities: Symmetric 5 x 5 power. Skin: No rashes, lesions or ulcers Psychiatry: Judgement and insight appear normal. Mood & affect appropriate.     Data Reviewed: I have personally reviewed following labs and imaging studies  Micro Results No results found for this or any previous visit (from the past 240 hour(s)).  Radiology Reports Dg Chest 2 View  Result Date: 11/10/2017 CLINICAL DATA:  72 year old male with abdominal and chest pain for several days. Abnormal EKG. Vomiting. EXAM: CHEST - 2 VIEW COMPARISON:  Chest radiographs 08/23/2013 and earlier. FINDINGS: Lower lung volumes with increased bibasilar reticular opacity. Stable left chest cardiac AICD. Stable cardiac size and mediastinal contours. Visualized tracheal air column is within normal limits. No pneumothorax. Upper lobe pulmonary vascularity is normal. Attenuation of upper lung markings raises the possibility of chronic emphysema. No pleural effusion or confluent pulmonary opacity. No acute osseous abnormality identified. Negative visible bowel gas pattern. IMPRESSION: 1. Lower lung volumes with reticular bibasilar opacity. Differential considerations include atelectasis, progressive chronic interstitial lung disease, and less likely viral/atypical respiratory infection. 2. No pleural effusion or other acute cardiopulmonary abnormality. Electronically Signed   By: Genevie Ann M.D.   On: 11/10/2017 18:51   Ct Abdomen Pelvis W Contrast  Result Date: 11/10/2017 CLINICAL DATA:  Abdominal cramping moving to the center of the chest for several days. Abnormal EKG with episode of vomiting last evening and indigestion today. EXAM: CT ABDOMEN AND PELVIS WITH CONTRAST TECHNIQUE: Multidetector CT imaging of the abdomen and pelvis was performed using the standard protocol following  bolus administration of intravenous contrast. CONTRAST:  171mL OMNIPAQUE IOHEXOL 300 MG/ML  SOLN COMPARISON:  07/01/2012 FINDINGS: Lower chest: Heart size is normal. Right atrial pacer, right ventricular ICD and coronary sinus leads are noted. No pericardial effusion. Subpleural blebs and bullae noted at each lung base with areas of subpleural fibrosis bilaterally. Hepatobiliary: No focal liver abnormality is seen. No gallstones, gallbladder wall thickening, or biliary dilatation. Pancreas: Peripancreatic inflammation without ductal dilatation, enhancing mass or pseudocyst formation. Spleen: Normal Adrenals/Urinary Tract: Normal bilateral adrenal glands and kidneys without enhancing mass nor obstructive uropathy. Slightly thick-walled appearance of the urinary bladder is likely due to underdistention. Cystitis is not entirely excluded. Stomach/Bowel: Fluid-filled distention of the stomach may reflect a gastroparesis. No bowel obstruction. Increased colonic stool burden is noted. Normal-appearing appendix is visualized. Vascular/Lymphatic: Patent bifurcating infrarenal aortic stent graft with further decrease in size of the native aneurysm now measuring up to 2.6 cm transverse, previously up to 6.4 cm. Mild-to-moderate atherosclerosis of the suprarenal native aorta. Patent branch vessels without occlusions. Reproductive: Top-normal size prostate. Unremarkable seminal vesicles. Other: Small amount of free fluid in the pelvis. Musculoskeletal: Degenerative change along the dorsal spine. IMPRESSION: 1. Peripancreatic inflammation compatible with acute uncomplicated pancreatitis. Associated small volume of free fluid in the pelvis. 2. Fluid-filled distention of the adjacent stomach compatible with gastroparesis possibly on the basis of adjacent pancreatitis. 3. Further decrease in  native infrarenal abdominal aortic aneurysm now down to 2.6 cm transverse from 6.4 cm. Patent indwelling bifurcating aortoiliac stent.  Electronically Signed   By: Ashley Royalty M.D.   On: 11/10/2017 21:42   US Abdomen Limited Ruq  Result Date: 11/11/2017 CLINICAL DATA:  Pancreatitis EXAM: ULTRASOUND ABDOMEN LIMITED RIGHT UPPER QUADRANT COMPARISON:  CT from earlier today FINDINGS: Gallbladder: No gallstones or wall thickening visualized. No sonographic Murphy sign noted by sonographer. Common bile duct: Diameter: 3 mm. Limited coverage that is negative for visible filling defect Liver: No focal lesion identified. Within normal limits in parenchymal echogenicity. Portal vein is patent on color Doppler imaging with normal direction of blood flow towards the liver. IMPRESSION: 1. No evidence of biliary stone. 2. Limited visualization of the common bile duct with normal diameter. Electronically Signed   By: Monte Fantasia M.D.   On: 11/11/2017 01:18     CBC Recent Labs  Lab 11/10/17 1805 11/11/17 0130  WBC 36.8* 39.3*  HGB 16.0 15.2  HCT 46.0 43.5  PLT 293 236  MCV 94.3 93.8  MCH 32.8 32.8  MCHC 34.8 34.9  RDW 12.1 12.1    Chemistries  Recent Labs  Lab 11/10/17 1805 11/11/17 0130  NA 133* 132*  K 4.7 4.1  CL 94* 93*  CO2 22 29  GLUCOSE 318* 331*  BUN 30* 24*  CREATININE 1.38* 1.24  CALCIUM 9.9 9.2  AST 44* 32  ALT 67* 54*  ALKPHOS 84 74  BILITOT 1.3* 1.9*   ------------------------------------------------------------------------------------------------------------------ estimated creatinine clearance is 64.8 mL/min (by C-G formula based on SCr of 1.24 mg/dL). ------------------------------------------------------------------------------------------------------------------ No results for input(s): HGBA1C in the last 72 hours. ------------------------------------------------------------------------------------------------------------------ Recent Labs    11/11/17 0127  CHOL 132  HDL 42  LDLCALC 63  TRIG 136  CHOLHDL 3.1    ------------------------------------------------------------------------------------------------------------------ No results for input(s): TSH, T4TOTAL, T3FREE, THYROIDAB in the last 72 hours.  Invalid input(s): FREET3 ------------------------------------------------------------------------------------------------------------------ No results for input(s): VITAMINB12, FOLATE, FERRITIN, TIBC, IRON, RETICCTPCT in the last 72 hours.  Coagulation profile No results for input(s): INR, PROTIME in the last 168 hours.  No results for input(s): DDIMER in the last 72 hours.  Cardiac Enzymes No results for input(s): CKMB, TROPONINI, MYOGLOBIN in the last 168 hours.  Invalid input(s): CK ------------------------------------------------------------------------------------------------------------------ Invalid input(s): POCBNP   CBG: Recent Labs  Lab 11/10/17 2050 11/11/17 1110  GLUCAP 303* 354*       Studies: Dg Chest 2 View  Result Date: 11/10/2017 CLINICAL DATA:  72 year old male with abdominal and chest pain for several days. Abnormal EKG. Vomiting. EXAM: CHEST - 2 VIEW COMPARISON:  Chest radiographs 08/23/2013 and earlier. FINDINGS: Lower lung volumes with increased bibasilar reticular opacity. Stable left chest cardiac AICD. Stable cardiac size and mediastinal contours. Visualized tracheal air column is within normal limits. No pneumothorax. Upper lobe pulmonary vascularity is normal. Attenuation of upper lung markings raises the possibility of chronic emphysema. No pleural effusion or confluent pulmonary opacity. No acute osseous abnormality identified. Negative visible bowel gas pattern. IMPRESSION: 1. Lower lung volumes with reticular bibasilar opacity. Differential considerations include atelectasis, progressive chronic interstitial lung disease, and less likely viral/atypical respiratory infection. 2. No pleural effusion or other acute cardiopulmonary abnormality. Electronically  Signed   By: Genevie Ann M.D.   On: 11/10/2017 18:51   Ct Abdomen Pelvis W Contrast  Result Date: 11/10/2017 CLINICAL DATA:  Abdominal cramping moving to the center of the chest for several days. Abnormal EKG with episode of vomiting last evening and indigestion  today. EXAM: CT ABDOMEN AND PELVIS WITH CONTRAST TECHNIQUE: Multidetector CT imaging of the abdomen and pelvis was performed using the standard protocol following bolus administration of intravenous contrast. CONTRAST:  184mL OMNIPAQUE IOHEXOL 300 MG/ML  SOLN COMPARISON:  07/01/2012 FINDINGS: Lower chest: Heart size is normal. Right atrial pacer, right ventricular ICD and coronary sinus leads are noted. No pericardial effusion. Subpleural blebs and bullae noted at each lung base with areas of subpleural fibrosis bilaterally. Hepatobiliary: No focal liver abnormality is seen. No gallstones, gallbladder wall thickening, or biliary dilatation. Pancreas: Peripancreatic inflammation without ductal dilatation, enhancing mass or pseudocyst formation. Spleen: Normal Adrenals/Urinary Tract: Normal bilateral adrenal glands and kidneys without enhancing mass nor obstructive uropathy. Slightly thick-walled appearance of the urinary bladder is likely due to underdistention. Cystitis is not entirely excluded. Stomach/Bowel: Fluid-filled distention of the stomach may reflect a gastroparesis. No bowel obstruction. Increased colonic stool burden is noted. Normal-appearing appendix is visualized. Vascular/Lymphatic: Patent bifurcating infrarenal aortic stent graft with further decrease in size of the native aneurysm now measuring up to 2.6 cm transverse, previously up to 6.4 cm. Mild-to-moderate atherosclerosis of the suprarenal native aorta. Patent branch vessels without occlusions. Reproductive: Top-normal size prostate. Unremarkable seminal vesicles. Other: Small amount of free fluid in the pelvis. Musculoskeletal: Degenerative change along the dorsal spine. IMPRESSION: 1.  Peripancreatic inflammation compatible with acute uncomplicated pancreatitis. Associated small volume of free fluid in the pelvis. 2. Fluid-filled distention of the adjacent stomach compatible with gastroparesis possibly on the basis of adjacent pancreatitis. 3. Further decrease in native infrarenal abdominal aortic aneurysm now down to 2.6 cm transverse from 6.4 cm. Patent indwelling bifurcating aortoiliac stent. Electronically Signed   By: Ashley Royalty M.D.   On: 11/10/2017 21:42   US Abdomen Limited Ruq  Result Date: 11/11/2017 CLINICAL DATA:  Pancreatitis EXAM: ULTRASOUND ABDOMEN LIMITED RIGHT UPPER QUADRANT COMPARISON:  CT from earlier today FINDINGS: Gallbladder: No gallstones or wall thickening visualized. No sonographic Murphy sign noted by sonographer. Common bile duct: Diameter: 3 mm. Limited coverage that is negative for visible filling defect Liver: No focal lesion identified. Within normal limits in parenchymal echogenicity. Portal vein is patent on color Doppler imaging with normal direction of blood flow towards the liver. IMPRESSION: 1. No evidence of biliary stone. 2. Limited visualization of the common bile duct with normal diameter. Electronically Signed   By: Monte Fantasia M.D.   On: 11/11/2017 01:18      Lab Results  Component Value Date   HGBA1C 8.7 (H) 07/07/2017   HGBA1C 8.4 (H) 02/25/2017   HGBA1C 8.2 (H) 11/19/2016   Lab Results  Component Value Date   MICROALBUR 1.7 10/07/2008   LDLCALC 63 11/11/2017   CREATININE 1.24 11/11/2017       Scheduled Meds: . amLODipine  5 mg Oral Daily  . aspirin EC  81 mg Oral Daily  . carvedilol  25 mg Oral BID WC  . cholecalciferol  1,200 Units Oral Daily  . enoxaparin (LOVENOX) injection  40 mg Subcutaneous Q24H  . hydrALAZINE  25 mg Oral Q8H  . insulin aspart  0-9 Units Subcutaneous TID WC  . insulin glargine  10 Units Subcutaneous Once  . insulin glargine  20 Units Subcutaneous QHS  . multivitamin with minerals  1 tablet  Oral Daily  . simvastatin  40 mg Oral QHS  . vitamin B-12  500 mcg Oral Daily   Continuous Infusions: . sodium chloride 125 mL/hr at 11/11/17 0000  . piperacillin-tazobactam (ZOSYN)  IV 3.375 g (  11/11/17 0656)     LOS: 0 days    Time spent: >30 MINS    Reyne Dumas  [facility] Pager [phone]. If 7PM-7AM, please contact night-coverage at [email or web address], password [facility] 11/11/2017, 11:54 AM  LOS: 0 days

## 2017-11-11 NOTE — Progress Notes (Signed)
Inpatient Diabetes Program Recommendations  AACE/ADA: New Consensus Statement on Inpatient Glycemic Control (2015)  Target Ranges:  Prepandial:   less than 140 mg/dL      Peak postprandial:   less than 180 mg/dL (1-2 hours)      Critically ill patients:  140 - 180 mg/dL   Lab Results  Component Value Date   GLUCAP 354 (H) 11/11/2017   HGBA1C 8.7 (H) 07/07/2017    Review of Glycemic Control Results for ABDULHADI, STOPA" (MRN [id number]) as of 11/11/2017 14:03  Ref. Range 11/10/2017 20:50 11/11/2017 11:10  Glucose-Capillary Latest Ref Range: 70 - 99 mg/dL 303 (H) 354 (H)   Diabetes history: Type 2 DM Outpatient Diabetes medications: Glipizide 10 mg BID, Metformin 1000 mg BID, Lantus 14 units QHS Current orders for Inpatient glycemic control: Lantus 20 units QHS, Novolog 0-9 units TID  Inpatient Diabetes Program Recommendations:    Last A1C was from 07/07/17 when he started Lantus, consider repeating A1C?  May also want to consider adding Novolog 0-5 units QHS. Will continue to follow.   Thanks, Bronson Curb, MSN, RNC-OB Diabetes Coordinator [phone] (8a-5p)

## 2017-11-12 LAB — DIFFERENTIAL
Basophils Absolute: 0 10*3/uL (ref 0.0–0.1)
Basophils Relative: 0 %
Eosinophils Absolute: 0 10*3/uL (ref 0.0–0.7)
Eosinophils Relative: 0 %
Lymphocytes Relative: 3 %
Lymphs Abs: 1 10*3/uL (ref 0.7–4.0)
Monocytes Absolute: 1.7 10*3/uL — ABNORMAL HIGH (ref 0.1–1.0)
Monocytes Relative: 5 %
Neutro Abs: 32.2 10*3/uL — ABNORMAL HIGH (ref 1.7–7.7)
Neutrophils Relative %: 92 %

## 2017-11-12 LAB — COMPREHENSIVE METABOLIC PANEL
ALT: 30 U/L (ref 0–44)
AST: 19 U/L (ref 15–41)
Albumin: 2.7 g/dL — ABNORMAL LOW (ref 3.5–5.0)
Alkaline Phosphatase: 57 U/L (ref 38–126)
Anion gap: 7 (ref 5–15)
BUN: 17 mg/dL (ref 8–23)
CO2: 25 mmol/L (ref 22–32)
Calcium: 8 mg/dL — ABNORMAL LOW (ref 8.9–10.3)
Chloride: 99 mmol/L (ref 98–111)
Creatinine, Ser: 1.2 mg/dL (ref 0.61–1.24)
GFR calc Af Amer: >60 mL/min (ref >60)
GFR calc non Af Amer: 59 mL/min — ABNORMAL LOW (ref >60)
Glucose, Bld: 244 mg/dL — ABNORMAL HIGH (ref 70–99)
Potassium: 3.9 mmol/L (ref 3.5–5.1)
Sodium: 131 mmol/L — ABNORMAL LOW (ref 135–145)
Total Bilirubin: 1.1 mg/dL (ref 0.3–1.2)
Total Protein: 5.8 g/dL — ABNORMAL LOW (ref 6.5–8.1)

## 2017-11-12 LAB — CBC
HCT: 39.1 % (ref 39.0–52.0)
Hemoglobin: 13.1 g/dL (ref 13.0–17.0)
MCH: 32.2 pg (ref 26.0–34.0)
MCHC: 33.5 g/dL (ref 30.0–36.0)
MCV: 96.1 fL (ref 78.0–100.0)
Platelets: 195 10*3/uL (ref 150–400)
RBC: 4.07 MIL/uL — ABNORMAL LOW (ref 4.22–5.81)
RDW: 12.6 % (ref 11.5–15.5)
WBC: 34.9 10*3/uL — ABNORMAL HIGH (ref 4.0–10.5)

## 2017-11-12 LAB — GLUCOSE, CAPILLARY
Glucose-Capillary: 246 mg/dL — ABNORMAL HIGH (ref 70–99)
Glucose-Capillary: 288 mg/dL — ABNORMAL HIGH (ref 70–99)
Glucose-Capillary: 278 mg/dL — ABNORMAL HIGH (ref 70–99)
Glucose-Capillary: 246 mg/dL — ABNORMAL HIGH (ref 70–99)

## 2017-11-12 LAB — HEMOGLOBIN A1C
Hgb A1c MFr Bld: 7.4 % — ABNORMAL HIGH (ref 4.8–5.6)
Mean Plasma Glucose: 166 mg/dL

## 2017-11-12 LAB — MAGNESIUM: Magnesium: 1.6 mg/dL — ABNORMAL LOW (ref 1.7–2.4)

## 2017-11-12 MED ORDER — SODIUM CHLORIDE 0.9 % IV SOLN
INTRAVENOUS | Status: DC | PRN
  Administered 2017-11-12 – 2017-11-13 (×3): 10 mL via INTRAVENOUS
  Administered 2017-11-14: 250 mL via INTRAVENOUS

## 2017-11-12 MED ORDER — INSULIN ASPART 100 UNIT/ML ~~LOC~~ SOLN
3.0000 [IU] | Freq: Three times a day (TID) | SUBCUTANEOUS | Status: DC
  Administered 2017-11-12 – 2017-11-14 (×7): 3 [IU] via SUBCUTANEOUS

## 2017-11-12 MED ORDER — MAGNESIUM SULFATE IN D5W 1-5 GM/100ML-% IV SOLN
1.0000 g | Freq: Once | INTRAVENOUS | Status: AC
  Administered 2017-11-12: 1 g via INTRAVENOUS
  Filled 2017-11-12: qty 100

## 2017-11-12 MED ORDER — INSULIN GLARGINE 100 UNIT/ML ~~LOC~~ SOLN
6.0000 [IU] | Freq: Once | SUBCUTANEOUS | Status: AC
  Administered 2017-11-12: 6 [IU] via SUBCUTANEOUS
  Filled 2017-11-12: qty 0.06

## 2017-11-12 MED ORDER — INSULIN GLARGINE 100 UNIT/ML ~~LOC~~ SOLN
28.0000 [IU] | Freq: Every day | SUBCUTANEOUS | Status: DC
  Administered 2017-11-12: 28 [IU] via SUBCUTANEOUS
  Filled 2017-11-12 (×3): qty 0.28

## 2017-11-12 NOTE — Progress Notes (Signed)
Pt has had uneventful day. Up ad lib to BR. Tolerated meals  Without any c/o nausea or emesis. Call bell at side

## 2017-11-12 NOTE — Progress Notes (Addendum)
[facility] PROGRESS NOTE  Luke [name] GEX:[id number] DOB: Dec 09, 1945 DOA: 11/10/2017   PCP: Luke Ghent, MD     Assessment/Plan: Principal Problem:   Acute pancreatitis Active Problems:   Type 2 diabetes mellitus with vascular disease ([place])   Essential hypertension, benign   CAD (coronary artery disease)   HLD (hyperlipidemia)   CKD (chronic kidney disease), stage III (HCC)   SIRS (systemic inflammatory response syndrome) ([place])   Pancreatitis  72 y.o. male with medical history significant of hypertension, hyperlipidemia, diabetes mellitus, repaired AAA, atrial fibrillation on not on anticoagulants, third-degree AV block with ICD placement, left bundle blockade, CAD, PAD, who presents with abdominal pain. CT abdomen/pelvis showed acute pancreatitis.  Patient is place on tele bed for  inpatient   Assessment/Plan  Acute pancreatitis: Lipase 1043. CT abdomen/pelvis showed acute pancreatitis.  - advanced clear liquids to full liquids , will not escalate  -IVF: 500 mLNS and then at 125 cc/hr -IV morphine for pain control, IV zofran for nausea -abdominal ultrasound to  ,  Limited visualization of the common bile duct with normal diameter. triglyceridemia  136 Proceed cautiously with advancement of diet given elevated white count  Etiology of pancreatitis unclear, could be lisinopril? Discussed late complications of necrotizing pancreatitis  Leukocytosis White count is improved from 39.3-34.9 check leukocyte alkaline phosphatase ,suspect this is a leukemoid reaction  Type 2 diabetes mellitus with vascular disease ([place]): Last A1c 8.7 on  07/07/17, poorly controled.repeat hemoglobin A1c. Patient is taking metformin, glipizide and Lantus at home -increased  Lantus to  28 units today -SSI, added NovoLog  Essential hypertension, benign: -Hold lisinopril due to pancreatitis - continue amlodipine,   - continue Coreg -Start oral hydralazine 25 mg 3 times daily -IV  hydralazine.   Nonsustained VT Patient has a ventricular paced rhythm, magnesium 1. , will replete  CAD: No CP -continue aspirin, Zocor, Coreg  HLD (hyperlipidemia) -Zocor  CKD (chronic kidney disease), stage III ([place]): Baseline creatinine 1.2-1.3.:  stable.  His creatinine is 1.38, BUN 30, close to baseline. - Follow-up BMP    SIRS (systemic inflammatory response syndrome) [facility]): Patient meets criteria for Sirs with leukocytosis, with WBC 36.8, tachycardia and tachypnea, no source of infection identified.  Possibly due to pancreatitis.  Given high WBC, IV Zosyn was started in ED, will continue. -Follow-up urinalysis -Blood culture -IV fluid as above -lactic acid normal on admission, Pro calcitonin 0.3 to    DVT prophylaxsis lovenox   Code Status:  Full code     Family Communication: Discussed in detail with the patient, all imaging results, lab results explained to the patient   Disposition Plan:   Discharge likely in 1-2 days pending improvement    Consultants:  None   Procedures:  None   Antibiotics: Anti-infectives (From admission, onward)   Start     Dose/Rate Route Frequency Ordered Stop   11/11/17 0600  piperacillin-tazobactam (ZOSYN) IVPB 3.375 g     3.375 g 12.5 mL/hr over 240 Minutes Intravenous Every 8 hours 11/10/17 2313     11/10/17 2230  piperacillin-tazobactam (ZOSYN) IVPB 3.375 g     3.375 g 100 mL/hr over 30 Minutes Intravenous  Once 11/10/17 2223 11/10/17 2300         HPI/Subjective: Would like to advance to  Full liquid No nausea vomiting abdominal pain  Objective: Vitals:   11/11/17 2300 11/12/17 0300 11/12/17 0415 11/12/17 0743  BP: 126/73 (!) 145/77  (!) 164/82  Pulse: 88  86  84  Resp: 17 16  20   Temp: 98.3 F (36.8 C) 98.2 F (36.8 C)  98.1 F (36.7 C)  TempSrc: Oral Oral  Oral  SpO2: 93% 98%  96%  Weight:   85.2 kg (187 lb 12.8 oz)   Height:        Intake/Output Summary (Last 24 hours) at 11/12/2017 7564 Last  data filed at 11/12/2017 0200 Gross per 24 hour  Intake 3774.7 ml  Output 300 ml  Net 3474.7 ml    Exam:  Examination:  General exam: Appears calm and comfortable  Respiratory system: Clear to auscultation. Respiratory effort normal. Cardiovascular system: S1 & S2 heard, RRR. No JVD, murmurs, rubs, gallops or clicks. No pedal edema. Gastrointestinal system: Abdomen is nondistended, soft and nontender. No organomegaly or masses felt. Normal bowel sounds heard. Central nervous system: Alert and oriented. No focal neurological deficits. Extremities: Symmetric 5 x 5 power. Skin: No rashes, lesions or ulcers Psychiatry: Judgement and insight appear normal. Mood & affect appropriate.     Data Reviewed: I have personally reviewed following labs and imaging studies  Micro Results No results found for this or any previous visit (from the past 240 hour(s)).  Radiology Reports Dg Chest 2 View  Result Date: 11/10/2017 CLINICAL DATA:  72 year old male with abdominal and chest pain for several days. Abnormal EKG. Vomiting. EXAM: CHEST - 2 VIEW COMPARISON:  Chest radiographs 08/23/2013 and earlier. FINDINGS: Lower lung volumes with increased bibasilar reticular opacity. Stable left chest cardiac AICD. Stable cardiac size and mediastinal contours. Visualized tracheal air column is within normal limits. No pneumothorax. Upper lobe pulmonary vascularity is normal. Attenuation of upper lung markings raises the possibility of chronic emphysema. No pleural effusion or confluent pulmonary opacity. No acute osseous abnormality identified. Negative visible bowel gas pattern. IMPRESSION: 1. Lower lung volumes with reticular bibasilar opacity. Differential considerations include atelectasis, progressive chronic interstitial lung disease, and less likely viral/atypical respiratory infection. 2. No pleural effusion or other acute cardiopulmonary abnormality. Electronically Signed   By: Luke [name] M.D.   On: 11/10/2017  18:51   Ct Abdomen Pelvis W Contrast  Result Date: 11/10/2017 CLINICAL DATA:  Abdominal cramping moving to the center of the chest for several days. Abnormal EKG with episode of vomiting last evening and indigestion today. EXAM: CT ABDOMEN AND PELVIS WITH CONTRAST TECHNIQUE: Multidetector CT imaging of the abdomen and pelvis was performed using the standard protocol following bolus administration of intravenous contrast. CONTRAST:  169mL OMNIPAQUE IOHEXOL 300 MG/ML  SOLN COMPARISON:  07/01/2012 FINDINGS: Lower chest: Heart size is normal. Right atrial pacer, right ventricular ICD and coronary sinus leads are noted. No pericardial effusion. Subpleural blebs and bullae noted at each lung base with areas of subpleural fibrosis bilaterally. Hepatobiliary: No focal liver abnormality is seen. No gallstones, gallbladder wall thickening, or biliary dilatation. Pancreas: Peripancreatic inflammation without ductal dilatation, enhancing mass or pseudocyst formation. Spleen: Normal Adrenals/Urinary Tract: Normal bilateral adrenal glands and kidneys without enhancing mass nor obstructive uropathy. Slightly thick-walled appearance of the urinary bladder is likely due to underdistention. Cystitis is not entirely excluded. Stomach/Bowel: Fluid-filled distention of the stomach may reflect a gastroparesis. No bowel obstruction. Increased colonic stool burden is noted. Normal-appearing appendix is visualized. Vascular/Lymphatic: Patent bifurcating infrarenal aortic stent graft with further decrease in size of the native aneurysm now measuring up to 2.6 cm transverse, previously up to 6.4 cm. Mild-to-moderate atherosclerosis of the suprarenal native aorta. Patent branch vessels without occlusions. Reproductive: Top-normal size prostate. Unremarkable seminal  vesicles. Other: Small amount of free fluid in the pelvis. Musculoskeletal: Degenerative change along the dorsal spine. IMPRESSION: 1. Peripancreatic inflammation compatible with  acute uncomplicated pancreatitis. Associated small volume of free fluid in the pelvis. 2. Fluid-filled distention of the adjacent stomach compatible with gastroparesis possibly on the basis of adjacent pancreatitis. 3. Further decrease in native infrarenal abdominal aortic aneurysm now down to 2.6 cm transverse from 6.4 cm. Patent indwelling bifurcating aortoiliac stent. Electronically Signed   By: Ashley Royalty M.D.   On: 11/10/2017 21:42   US Abdomen Limited Ruq  Result Date: 11/11/2017 CLINICAL DATA:  Pancreatitis EXAM: ULTRASOUND ABDOMEN LIMITED RIGHT UPPER QUADRANT COMPARISON:  CT from earlier today FINDINGS: Gallbladder: No gallstones or wall thickening visualized. No sonographic Murphy sign noted by sonographer. Common bile duct: Diameter: 3 mm. Limited coverage that is negative for visible filling defect Liver: No focal lesion identified. Within normal limits in parenchymal echogenicity. Portal vein is patent on color Doppler imaging with normal direction of blood flow towards the liver. IMPRESSION: 1. No evidence of biliary stone. 2. Limited visualization of the common bile duct with normal diameter. Electronically Signed   By: Monte Fantasia M.D.   On: 11/11/2017 01:18     CBC Recent Labs  Lab 11/10/17 1805 11/11/17 0130 11/12/17 0601  WBC 36.8* 39.3* 34.9*  HGB 16.0 15.2 13.1  HCT 46.0 43.5 39.1  PLT 293 236 195  MCV 94.3 93.8 96.1  MCH 32.8 32.8 32.2  MCHC 34.8 34.9 33.5  RDW 12.1 12.1 12.6  LYMPHSABS  --   --  1.0  MONOABS  --   --  1.7*  EOSABS  --   --  0.0  BASOSABS  --   --  0.0    Chemistries  Recent Labs  Lab 11/10/17 1805 11/11/17 0130 11/12/17 0601  NA 133* 132* 131*  K 4.7 4.1 3.9  CL 94* 93* 99  CO2 22 29 25   GLUCOSE 318* 331* 244*  BUN 30* 24* 17  CREATININE 1.38* 1.24 1.20  CALCIUM 9.9 9.2 8.0*  AST 44* 32 19  ALT 67* 54* 30  ALKPHOS 84 74 57  BILITOT 1.3* 1.9* 1.1    ------------------------------------------------------------------------------------------------------------------ estimated creatinine clearance is 67.5 mL/min (by C-G formula based on SCr of 1.2 mg/dL). ------------------------------------------------------------------------------------------------------------------ No results for input(s): HGBA1C in the last 72 hours. ------------------------------------------------------------------------------------------------------------------ Recent Labs    11/11/17 0127  CHOL 132  HDL 42  LDLCALC 63  TRIG 136  CHOLHDL 3.1   ------------------------------------------------------------------------------------------------------------------ No results for input(s): TSH, T4TOTAL, T3FREE, THYROIDAB in the last 72 hours.  Invalid input(s): FREET3 ------------------------------------------------------------------------------------------------------------------ No results for input(s): VITAMINB12, FOLATE, FERRITIN, TIBC, IRON, RETICCTPCT in the last 72 hours.  Coagulation profile No results for input(s): INR, PROTIME in the last 168 hours.  No results for input(s): DDIMER in the last 72 hours.  Cardiac Enzymes No results for input(s): CKMB, TROPONINI, MYOGLOBIN in the last 168 hours.  Invalid input(s): CK ------------------------------------------------------------------------------------------------------------------ Invalid input(s): POCBNP   CBG: Recent Labs  Lab 11/10/17 2050 11/11/17 1110 11/11/17 1631 11/11/17 2114 11/12/17 0741  GLUCAP 303* 354* 338* 324* 246*       Studies: Dg Chest 2 View  Result Date: 11/10/2017 CLINICAL DATA:  72 year old male with abdominal and chest pain for several days. Abnormal EKG. Vomiting. EXAM: CHEST - 2 VIEW COMPARISON:  Chest radiographs 08/23/2013 and earlier. FINDINGS: Lower lung volumes with increased bibasilar reticular opacity. Stable left chest cardiac AICD. Stable cardiac size and  mediastinal contours. Visualized tracheal  air column is within normal limits. No pneumothorax. Upper lobe pulmonary vascularity is normal. Attenuation of upper lung markings raises the possibility of chronic emphysema. No pleural effusion or confluent pulmonary opacity. No acute osseous abnormality identified. Negative visible bowel gas pattern. IMPRESSION: 1. Lower lung volumes with reticular bibasilar opacity. Differential considerations include atelectasis, progressive chronic interstitial lung disease, and less likely viral/atypical respiratory infection. 2. No pleural effusion or other acute cardiopulmonary abnormality. Electronically Signed   By: Luke [name] M.D.   On: 11/10/2017 18:51   Ct Abdomen Pelvis W Contrast  Result Date: 11/10/2017 CLINICAL DATA:  Abdominal cramping moving to the center of the chest for several days. Abnormal EKG with episode of vomiting last evening and indigestion today. EXAM: CT ABDOMEN AND PELVIS WITH CONTRAST TECHNIQUE: Multidetector CT imaging of the abdomen and pelvis was performed using the standard protocol following bolus administration of intravenous contrast. CONTRAST:  182mL OMNIPAQUE IOHEXOL 300 MG/ML  SOLN COMPARISON:  07/01/2012 FINDINGS: Lower chest: Heart size is normal. Right atrial pacer, right ventricular ICD and coronary sinus leads are noted. No pericardial effusion. Subpleural blebs and bullae noted at each lung base with areas of subpleural fibrosis bilaterally. Hepatobiliary: No focal liver abnormality is seen. No gallstones, gallbladder wall thickening, or biliary dilatation. Pancreas: Peripancreatic inflammation without ductal dilatation, enhancing mass or pseudocyst formation. Spleen: Normal Adrenals/Urinary Tract: Normal bilateral adrenal glands and kidneys without enhancing mass nor obstructive uropathy. Slightly thick-walled appearance of the urinary bladder is likely due to underdistention. Cystitis is not entirely excluded. Stomach/Bowel: Fluid-filled  distention of the stomach may reflect a gastroparesis. No bowel obstruction. Increased colonic stool burden is noted. Normal-appearing appendix is visualized. Vascular/Lymphatic: Patent bifurcating infrarenal aortic stent graft with further decrease in size of the native aneurysm now measuring up to 2.6 cm transverse, previously up to 6.4 cm. Mild-to-moderate atherosclerosis of the suprarenal native aorta. Patent branch vessels without occlusions. Reproductive: Top-normal size prostate. Unremarkable seminal vesicles. Other: Small amount of free fluid in the pelvis. Musculoskeletal: Degenerative change along the dorsal spine. IMPRESSION: 1. Peripancreatic inflammation compatible with acute uncomplicated pancreatitis. Associated small volume of free fluid in the pelvis. 2. Fluid-filled distention of the adjacent stomach compatible with gastroparesis possibly on the basis of adjacent pancreatitis. 3. Further decrease in native infrarenal abdominal aortic aneurysm now down to 2.6 cm transverse from 6.4 cm. Patent indwelling bifurcating aortoiliac stent. Electronically Signed   By: Ashley Royalty M.D.   On: 11/10/2017 21:42   US Abdomen Limited Ruq  Result Date: 11/11/2017 CLINICAL DATA:  Pancreatitis EXAM: ULTRASOUND ABDOMEN LIMITED RIGHT UPPER QUADRANT COMPARISON:  CT from earlier today FINDINGS: Gallbladder: No gallstones or wall thickening visualized. No sonographic Murphy sign noted by sonographer. Common bile duct: Diameter: 3 mm. Limited coverage that is negative for visible filling defect Liver: No focal lesion identified. Within normal limits in parenchymal echogenicity. Portal vein is patent on color Doppler imaging with normal direction of blood flow towards the liver. IMPRESSION: 1. No evidence of biliary stone. 2. Limited visualization of the common bile duct with normal diameter. Electronically Signed   By: Monte Fantasia M.D.   On: 11/11/2017 01:18      Lab Results  Component Value Date   HGBA1C 8.7  (H) 07/07/2017   HGBA1C 8.4 (H) 02/25/2017   HGBA1C 8.2 (H) 11/19/2016   Lab Results  Component Value Date   MICROALBUR 1.7 10/07/2008   LDLCALC 63 11/11/2017   CREATININE 1.20 11/12/2017       Scheduled Meds: .  amLODipine  5 mg Oral Daily  . aspirin EC  81 mg Oral Daily  . atorvastatin  20 mg Oral q1800  . carvedilol  25 mg Oral BID WC  . cholecalciferol  1,200 Units Oral Daily  . enoxaparin (LOVENOX) injection  40 mg Subcutaneous Q24H  . hydrALAZINE  25 mg Oral Q8H  . insulin aspart  0-9 Units Subcutaneous TID WC  . insulin aspart  3 Units Subcutaneous TID WC  . insulin glargine  28 Units Subcutaneous QHS  . insulin glargine  6 Units Subcutaneous Once  . multivitamin with minerals  1 tablet Oral Daily  . vitamin B-12  500 mcg Oral Daily   Continuous Infusions: . sodium chloride 150 mL/hr at 11/11/17 1348  . piperacillin-tazobactam (ZOSYN)  IV 3.375 g (11/12/17 0605)     LOS: 1 day    Time spent: >30 MINS    Reyne Dumas  [facility] Pager [phone]. If 7PM-7AM, please contact night-coverage at [email or web address], password [facility] 11/12/2017, 9:22 AM  LOS: 1 day

## 2017-11-13 LAB — BASIC METABOLIC PANEL
Anion gap: 8 (ref 5–15)
BUN: 14 mg/dL (ref 8–23)
CO2: 24 mmol/L (ref 22–32)
Calcium: 7.9 mg/dL — ABNORMAL LOW (ref 8.9–10.3)
Chloride: 99 mmol/L (ref 98–111)
Creatinine, Ser: 1 mg/dL (ref 0.61–1.24)
GFR calc Af Amer: >60 mL/min (ref >60)
GFR calc non Af Amer: >60 mL/min (ref >60)
Glucose, Bld: 197 mg/dL — ABNORMAL HIGH (ref 70–99)
Potassium: 3.8 mmol/L (ref 3.5–5.1)
Sodium: 131 mmol/L — ABNORMAL LOW (ref 135–145)

## 2017-11-13 LAB — DIFFERENTIAL
Abs Immature Granulocytes: 0.4 10*3/uL — ABNORMAL HIGH (ref 0.0–0.1)
Basophils Absolute: 0 10*3/uL (ref 0.0–0.1)
Basophils Relative: 0 %
Eosinophils Absolute: 0 10*3/uL (ref 0.0–0.7)
Eosinophils Relative: 0 %
Immature Granulocytes: 2 %
Lymphocytes Relative: 5 %
Lymphs Abs: 1.2 10*3/uL (ref 0.7–4.0)
Monocytes Absolute: 1.2 10*3/uL — ABNORMAL HIGH (ref 0.1–1.0)
Monocytes Relative: 5 %
Neutro Abs: 21.3 10*3/uL — ABNORMAL HIGH (ref 1.7–7.7)
Neutrophils Relative %: 88 %

## 2017-11-13 LAB — GLUCOSE, CAPILLARY
Glucose-Capillary: 209 mg/dL — ABNORMAL HIGH (ref 70–99)
Glucose-Capillary: 238 mg/dL — ABNORMAL HIGH (ref 70–99)
Glucose-Capillary: 224 mg/dL — ABNORMAL HIGH (ref 70–99)
Glucose-Capillary: 204 mg/dL — ABNORMAL HIGH (ref 70–99)

## 2017-11-13 LAB — CBC
HCT: 35.7 % — ABNORMAL LOW (ref 39.0–52.0)
Hemoglobin: 12.1 g/dL — ABNORMAL LOW (ref 13.0–17.0)
MCH: 32.4 pg (ref 26.0–34.0)
MCHC: 33.9 g/dL (ref 30.0–36.0)
MCV: 95.5 fL (ref 78.0–100.0)
Platelets: 187 10*3/uL (ref 150–400)
RBC: 3.74 MIL/uL — ABNORMAL LOW (ref 4.22–5.81)
RDW: 12.4 % (ref 11.5–15.5)
WBC: 24.2 10*3/uL — ABNORMAL HIGH (ref 4.0–10.5)

## 2017-11-13 MED ORDER — INSULIN GLARGINE 100 UNIT/ML ~~LOC~~ SOLN
32.0000 [IU] | Freq: Every day | SUBCUTANEOUS | Status: DC
  Administered 2017-11-13: 32 [IU] via SUBCUTANEOUS
  Filled 2017-11-13 (×2): qty 0.32

## 2017-11-13 MED ORDER — SIMETHICONE 80 MG PO CHEW
80.0000 mg | CHEWABLE_TABLET | Freq: Four times a day (QID) | ORAL | Status: DC | PRN
  Administered 2017-11-13 (×3): 80 mg via ORAL
  Filled 2017-11-13 (×3): qty 1

## 2017-11-13 NOTE — Progress Notes (Signed)
[facility] PROGRESS NOTE  Luke [name] NAT:[id number] DOB: Mar 08, 1946 DOA: 11/10/2017   PCP: Tonia Ghent, MD     Assessment/Plan: Principal Problem:   Acute pancreatitis Active Problems:   Type 2 diabetes mellitus with vascular disease ([place])   Essential hypertension, benign   CAD (coronary artery disease)   HLD (hyperlipidemia)   CKD (chronic kidney disease), stage III (HCC)   SIRS (systemic inflammatory response syndrome) ([place])   Pancreatitis  72 y.o. male with medical history significant of hypertension, hyperlipidemia, diabetes mellitus, repaired AAA, atrial fibrillation on not on anticoagulants, third-degree AV block with ICD placement, left bundle blockade, CAD, PAD, who presents with abdominal pain. CT abdomen/pelvis showed acute pancreatitis.  Patient is improving , met SIRS criteria , hence  inpatient .   Assessment/Plan  Acute pancreatitis: Lipase 1043. CT abdomen/pelvis showed acute pancreatitis.  - advanced clear liquids to full liquids , will not escalate  -IVF: NS   125 cc/hr -IV morphine for pain control, IV zofran for nausea -abdominal ultrasound   ,  Limited visualization of the common bile duct with normal diameter.No evidence of biliary stone triglyceridemia  136 Proceed cautiously with advancement of diet given elevated white count  Etiology of pancreatitis unclear, could be lisinopril? Discussed late complications of necrotizing pancreatitis Met SIRS CRITERIA on admission Advanced to soft diet , likely dc in am if WBC is better  Leukocytosis White count is improved from 39.3-34.9 >24.2 check leukocyte alkaline phosphatase ,suspect this is a leukemoid reaction  Type 2 diabetes mellitus with vascular disease ([place]): Last A1c 8.7 on  07/07/17, poorly controled.  hemoglobin A1c 7.4. Patient is taking metformin, glipizide and Lantus at home -increased  Lantus to  32 units today -SSI, added NovoLog  Essential hypertension, benign: -Hold  lisinopril due to pancreatitis - continue amlodipine,   - continue Coreg -Started  oral hydralazine 25 mg 3 times daily     Nonsustained VT Patient has a ventricular paced rhythm, magnesium 1.4   repleted  CAD: No CP -continue aspirin, Zocor, Coreg  HLD (hyperlipidemia) -Zocor  CKD (chronic kidney disease), stage III ([place]): Baseline creatinine 1.2-1.3.:  stable.  His creatinine is 1.38>1.00,   close to baseline. - Follow-up BMP    SIRS (systemic inflammatory response syndrome) [facility]): Patient meets criteria for Sirs with leukocytosis, with WBC 36.8, tachycardia and tachypnea, no source of infection identified.  Possibly due to pancreatitis.  Given high WBC, IV Zosyn was started in ED, will continue. -Follow-up urinalysis -Blood culture -IV fluid as above -lactic acid normal on admission, Pro calcitonin 0.32    DVT prophylaxsis lovenox   Code Status:  Full code     Family Communication: Discussed in detail with the patient, all imaging results, lab results explained to the patient   Disposition Plan:   Discharge likely in 1-2 days pending improvement    Consultants:  None   Procedures:  None   Antibiotics: Anti-infectives (From admission, onward)   Start     Dose/Rate Route Frequency Ordered Stop   11/11/17 0600  piperacillin-tazobactam (ZOSYN) IVPB 3.375 g     3.375 g 12.5 mL/hr over 240 Minutes Intravenous Every 8 hours 11/10/17 2313     11/10/17 2230  piperacillin-tazobactam (ZOSYN) IVPB 3.375 g     3.375 g 100 mL/hr over 30 Minutes Intravenous  Once 11/10/17 2223 11/10/17 2300         HPI/Subjective: Would like to advance to  Full liquid No nausea vomiting  abdominal pain  Objective: Vitals:   11/12/17 1937 11/12/17 1956 11/13/17 0506 11/13/17 0513  BP: (!) 143/67   (!) 168/73  Pulse: 70   76  Resp: 18   16  Temp: 99.1 F (37.3 C)   98.5 F (36.9 C)  TempSrc: Oral   Oral  SpO2: 91% 93%  93%  Weight:   86.3 kg (190 lb 4.8 oz)    Height:   '6\' 3"'$  (1.905 m)     Intake/Output Summary (Last 24 hours) at 11/13/2017 1119 Last data filed at 11/13/2017 0954 Gross per 24 hour  Intake 5103.91 ml  Output 200 ml  Net 4903.91 ml    Exam:  Examination:  General exam: Appears calm and comfortable  Respiratory system: Clear to auscultation. Respiratory effort normal. Cardiovascular system: S1 & S2 heard, RRR. No JVD, murmurs, rubs, gallops or clicks. No pedal edema. Gastrointestinal system: Abdomen is nondistended, soft and nontender. No organomegaly or masses felt. Normal bowel sounds heard. Central nervous system: Alert and oriented. No focal neurological deficits. Extremities: Symmetric 5 x 5 power. Skin: No rashes, lesions or ulcers Psychiatry: Judgement and insight appear normal. Mood & affect appropriate.     Data Reviewed: I have personally reviewed following labs and imaging studies  Micro Results Recent Results (from the past 240 hour(s))  Culture, blood (Routine X 2) w Reflex to ID Panel     Status: None (Preliminary result)   Collection Time: 11/11/17  1:22 AM  Result Value Ref Range Status   Specimen Description BLOOD LEFT ANTECUBITAL  Final   Special Requests   Final    BOTTLES DRAWN AEROBIC ONLY Blood Culture adequate volume   Culture   Final    NO GROWTH 2 DAYS Performed at [facility], [address] [place], [place] [address]    Report Status PENDING  Incomplete  Culture, blood (Routine X 2) w Reflex to ID Panel     Status: None (Preliminary result)   Collection Time: 11/11/17  1:28 AM  Result Value Ref Range Status   Specimen Description BLOOD LEFT ANTECUBITAL  Final   Special Requests   Final    BOTTLES DRAWN AEROBIC ONLY Blood Culture adequate volume   Culture   Final    NO GROWTH 2 DAYS Performed at [facility], [place] [address] [place], [place] [address]    Report Status PENDING  Incomplete    Radiology Reports Dg Chest 2 View  Result Date: 11/10/2017 CLINICAL DATA:   72 year old male with abdominal and chest pain for several days. Abnormal EKG. Vomiting. EXAM: CHEST - 2 VIEW COMPARISON:  Chest radiographs 08/23/2013 and earlier. FINDINGS: Lower lung volumes with increased bibasilar reticular opacity. Stable left chest cardiac AICD. Stable cardiac size and mediastinal contours. Visualized tracheal air column is within normal limits. No pneumothorax. Upper lobe pulmonary vascularity is normal. Attenuation of upper lung markings raises the possibility of chronic emphysema. No pleural effusion or confluent pulmonary opacity. No acute osseous abnormality identified. Negative visible bowel gas pattern. IMPRESSION: 1. Lower lung volumes with reticular bibasilar opacity. Differential considerations include atelectasis, progressive chronic interstitial lung disease, and less likely viral/atypical respiratory infection. 2. No pleural effusion or other acute cardiopulmonary abnormality. Electronically Signed   By: Genevie Ann M.D.   On: 11/10/2017 18:51   Ct Abdomen Pelvis W Contrast  Result Date: 11/10/2017 CLINICAL DATA:  Abdominal cramping moving to the center of the chest for several days. Abnormal EKG with episode of vomiting last  evening and indigestion today. EXAM: CT ABDOMEN AND PELVIS WITH CONTRAST TECHNIQUE: Multidetector CT imaging of the abdomen and pelvis was performed using the standard protocol following bolus administration of intravenous contrast. CONTRAST:  128m OMNIPAQUE IOHEXOL 300 MG/ML  SOLN COMPARISON:  07/01/2012 FINDINGS: Lower chest: Heart size is normal. Right atrial pacer, right ventricular ICD and coronary sinus leads are noted. No pericardial effusion. Subpleural blebs and bullae noted at each lung base with areas of subpleural fibrosis bilaterally. Hepatobiliary: No focal liver abnormality is seen. No gallstones, gallbladder wall thickening, or biliary dilatation. Pancreas: Peripancreatic inflammation without ductal dilatation, enhancing mass or pseudocyst  formation. Spleen: Normal Adrenals/Urinary Tract: Normal bilateral adrenal glands and kidneys without enhancing mass nor obstructive uropathy. Slightly thick-walled appearance of the urinary bladder is likely due to underdistention. Cystitis is not entirely excluded. Stomach/Bowel: Fluid-filled distention of the stomach may reflect a gastroparesis. No bowel obstruction. Increased colonic stool burden is noted. Normal-appearing appendix is visualized. Vascular/Lymphatic: Patent bifurcating infrarenal aortic stent graft with further decrease in size of the native aneurysm now measuring up to 2.6 cm transverse, previously up to 6.4 cm. Mild-to-moderate atherosclerosis of the suprarenal native aorta. Patent branch vessels without occlusions. Reproductive: Top-normal size prostate. Unremarkable seminal vesicles. Other: Small amount of free fluid in the pelvis. Musculoskeletal: Degenerative change along the dorsal spine. IMPRESSION: 1. Peripancreatic inflammation compatible with acute uncomplicated pancreatitis. Associated small volume of free fluid in the pelvis. 2. Fluid-filled distention of the adjacent stomach compatible with gastroparesis possibly on the basis of adjacent pancreatitis. 3. Further decrease in native infrarenal abdominal aortic aneurysm now down to 2.6 cm transverse from 6.4 cm. Patent indwelling bifurcating aortoiliac stent. Electronically Signed   By: DAshley RoyaltyM.D.   On: 11/10/2017 21:42   UKoreaAbdomen Limited Ruq  Result Date: 11/11/2017 CLINICAL DATA:  Pancreatitis EXAM: ULTRASOUND ABDOMEN LIMITED RIGHT UPPER QUADRANT COMPARISON:  CT from earlier today FINDINGS: Gallbladder: No gallstones or wall thickening visualized. No sonographic Murphy sign noted by sonographer. Common bile duct: Diameter: 3 mm. Limited coverage that is negative for visible filling defect Liver: No focal lesion identified. Within normal limits in parenchymal echogenicity. Portal vein is patent on color Doppler imaging with  normal direction of blood flow towards the liver. IMPRESSION: 1. No evidence of biliary stone. 2. Limited visualization of the common bile duct with normal diameter. Electronically Signed   By: JMonte FantasiaM.D.   On: 11/11/2017 01:18     CBC Recent Labs  Lab 11/10/17 1805 11/11/17 0130 11/12/17 0601 11/13/17 0557  WBC 36.8* 39.3* 34.9* 24.2*  HGB 16.0 15.2 13.1 12.1*  HCT 46.0 43.5 39.1 35.7*  PLT 293 236 195 187  MCV 94.3 93.8 96.1 95.5  MCH 32.8 32.8 32.2 32.4  MCHC 34.8 34.9 33.5 33.9  RDW 12.1 12.1 12.6 12.4  LYMPHSABS  --   --  1.0 1.2  MONOABS  --   --  1.7* 1.2*  EOSABS  --   --  0.0 0.0  BASOSABS  --   --  0.0 0.0    Chemistries  Recent Labs  Lab 11/10/17 1805 11/11/17 0130 11/12/17 0601 11/12/17 0941 11/13/17 0557  NA 133* 132* 131*  --  131*  K 4.7 4.1 3.9  --  3.8  CL 94* 93* 99  --  99  CO2 '22 29 25  '$ --  24  GLUCOSE 318* 331* 244*  --  197*  BUN 30* 24* 17  --  14  CREATININE 1.38* 1.24 1.20  --  1.00  CALCIUM 9.9 9.2 8.0*  --  7.9*  MG  --   --   --  1.6*  --   AST 44* 32 19  --   --   ALT 67* 54* 30  --   --   ALKPHOS 84 74 57  --   --   BILITOT 1.3* 1.9* 1.1  --   --    ------------------------------------------------------------------------------------------------------------------ estimated creatinine clearance is 81 mL/min (by C-G formula based on SCr of 1 mg/dL). ------------------------------------------------------------------------------------------------------------------ Recent Labs    11/12/17 0941  HGBA1C 7.4*   ------------------------------------------------------------------------------------------------------------------ Recent Labs    11/11/17 0127  CHOL 132  HDL 42  LDLCALC 63  TRIG 136  CHOLHDL 3.1   ------------------------------------------------------------------------------------------------------------------ No results for input(s): TSH, T4TOTAL, T3FREE, THYROIDAB in the last 72 hours.  Invalid input(s):  FREET3 ------------------------------------------------------------------------------------------------------------------ No results for input(s): VITAMINB12, FOLATE, FERRITIN, TIBC, IRON, RETICCTPCT in the last 72 hours.  Coagulation profile No results for input(s): INR, PROTIME in the last 168 hours.  No results for input(s): DDIMER in the last 72 hours.  Cardiac Enzymes No results for input(s): CKMB, TROPONINI, MYOGLOBIN in the last 168 hours.  Invalid input(s): CK ------------------------------------------------------------------------------------------------------------------ Invalid input(s): POCBNP   CBG: Recent Labs  Lab 11/12/17 0741 11/12/17 1114 11/12/17 1641 11/12/17 2136 11/13/17 0735  GLUCAP 246* 288* 278* 246* 209*       Studies: No results found.    Lab Results  Component Value Date   HGBA1C 7.4 (H) 11/12/2017   HGBA1C 8.7 (H) 07/07/2017   HGBA1C 8.4 (H) 02/25/2017   Lab Results  Component Value Date   MICROALBUR 1.7 10/07/2008   LDLCALC 63 11/11/2017   CREATININE 1.00 11/13/2017       Scheduled Meds: . amLODipine  5 mg Oral Daily  . aspirin EC  81 mg Oral Daily  . atorvastatin  20 mg Oral q1800  . carvedilol  25 mg Oral BID WC  . cholecalciferol  1,200 Units Oral Daily  . enoxaparin (LOVENOX) injection  40 mg Subcutaneous Q24H  . hydrALAZINE  25 mg Oral Q8H  . insulin aspart  0-9 Units Subcutaneous TID WC  . insulin aspart  3 Units Subcutaneous TID WC  . insulin glargine  28 Units Subcutaneous QHS  . multivitamin with minerals  1 tablet Oral Daily  . vitamin B-12  500 mcg Oral Daily   Continuous Infusions: . sodium chloride 125 mL/hr at 11/13/17 0249  . sodium chloride 10 mL (11/13/17 0519)  . piperacillin-tazobactam (ZOSYN)  IV 3.375 g (11/13/17 0519)     LOS: 2 days    Time spent: >30 MINS    Reyne Dumas  [facility] Pager [phone]. If 7PM-7AM, please contact night-coverage at [email or web address], password  [facility] 11/13/2017, 11:19 AM  LOS: 2 days

## 2017-11-14 DIAGNOSIS — I10 Essential (primary) hypertension: Secondary | ICD-10-CM

## 2017-11-14 DIAGNOSIS — K8500 Idiopathic acute pancreatitis without necrosis or infection: Secondary | ICD-10-CM

## 2017-11-14 DIAGNOSIS — N183 Chronic kidney disease, stage 3 (moderate): Secondary | ICD-10-CM

## 2017-11-14 DIAGNOSIS — E1159 Type 2 diabetes mellitus with other circulatory complications: Secondary | ICD-10-CM

## 2017-11-14 DIAGNOSIS — E785 Hyperlipidemia, unspecified: Secondary | ICD-10-CM

## 2017-11-14 LAB — COMPREHENSIVE METABOLIC PANEL
ALT: 46 U/L — ABNORMAL HIGH (ref 0–44)
AST: 40 U/L (ref 15–41)
Albumin: 2.3 g/dL — ABNORMAL LOW (ref 3.5–5.0)
Alkaline Phosphatase: 55 U/L (ref 38–126)
Anion gap: 9 (ref 5–15)
BUN: 13 mg/dL (ref 8–23)
CO2: 24 mmol/L (ref 22–32)
Calcium: 7.9 mg/dL — ABNORMAL LOW (ref 8.9–10.3)
Chloride: 97 mmol/L — ABNORMAL LOW (ref 98–111)
Creatinine, Ser: 1.05 mg/dL (ref 0.61–1.24)
GFR calc Af Amer: >60 mL/min (ref >60)
GFR calc non Af Amer: >60 mL/min (ref >60)
Glucose, Bld: 226 mg/dL — ABNORMAL HIGH (ref 70–99)
Potassium: 4 mmol/L (ref 3.5–5.1)
Sodium: 130 mmol/L — ABNORMAL LOW (ref 135–145)
Total Bilirubin: 0.9 mg/dL (ref 0.3–1.2)
Total Protein: 5.3 g/dL — ABNORMAL LOW (ref 6.5–8.1)

## 2017-11-14 LAB — CBC
HCT: 35.1 % — ABNORMAL LOW (ref 39.0–52.0)
Hemoglobin: 11.7 g/dL — ABNORMAL LOW (ref 13.0–17.0)
MCH: 31.9 pg (ref 26.0–34.0)
MCHC: 33.3 g/dL (ref 30.0–36.0)
MCV: 95.6 fL (ref 78.0–100.0)
Platelets: 220 10*3/uL (ref 150–400)
RBC: 3.67 MIL/uL — ABNORMAL LOW (ref 4.22–5.81)
RDW: 12.2 % (ref 11.5–15.5)
WBC: 17.5 10*3/uL — ABNORMAL HIGH (ref 4.0–10.5)

## 2017-11-14 LAB — DIFFERENTIAL
Abs Immature Granulocytes: 0.1 10*3/uL (ref 0.0–0.1)
Basophils Absolute: 0 10*3/uL (ref 0.0–0.1)
Basophils Relative: 0 %
Eosinophils Absolute: 0.1 10*3/uL (ref 0.0–0.7)
Eosinophils Relative: 1 %
Immature Granulocytes: 1 %
Lymphocytes Relative: 8 %
Lymphs Abs: 1.4 10*3/uL (ref 0.7–4.0)
Monocytes Absolute: 1.2 10*3/uL — ABNORMAL HIGH (ref 0.1–1.0)
Monocytes Relative: 7 %
Neutro Abs: 14.6 10*3/uL — ABNORMAL HIGH (ref 1.7–7.7)
Neutrophils Relative %: 83 %

## 2017-11-14 LAB — GLUCOSE, CAPILLARY
Glucose-Capillary: 184 mg/dL — ABNORMAL HIGH (ref 70–99)
Glucose-Capillary: 164 mg/dL — ABNORMAL HIGH (ref 70–99)

## 2017-11-14 LAB — LIPASE, BLOOD: Lipase: 28 U/L (ref 11–51)

## 2017-11-14 MED ORDER — HYDRALAZINE HCL 25 MG PO TABS: 25.0000 mg | ORAL_TABLET | Freq: Three times a day (TID) | ORAL | 2 refills | Status: DC

## 2017-11-14 MED ORDER — AMLODIPINE BESYLATE 10 MG PO TABS: 10.0000 mg | ORAL_TABLET | Freq: Every day | ORAL | 3 refills | Status: DC

## 2017-11-14 NOTE — Plan of Care (Signed)
Nutrition Education Note  RD consulted for nutrition education regarding pancreatitis.   Spoke with pt and wife at bedside, who report that pt usually has a good appetite and tries to follow a healthful diet. Pt reports concern over elevated Hgb A1c and recent transition to insulin therapy. Typical meal intake is as follows: Breakfast: eggs, grits or toast OR biscuit with ham; Lunch: sandwich and potato chips or frozen mac and cheese; Dinner: meat, starch, and vegetable.   RD provided "Pancreatitis Nutrition Therapy" handout from the Academy of [employer]etics. Reviewed patient's dietary recall. Provided examples on ways to decrease fast intake in diet, including incorporating low fat versions of dairy products, low fat cuts of protein, and cooking methods. Encouraged fresh fruits and vegetables as well as whole grain sources of carbohydrates to maximize fiber intake.   RD discussed why it is important for patient to adhere to diet recommendations. Also discussed ways to improve glycemic control, such as limiting concentrated sweets and beverages. Teach back method used.  Expect good compliance.  Body mass index is 24.19 kg/m. Pt meets criteria for normal weight range based on current BMI.  Current diet order is Heart Healthy, patient is consuming approximately 100% of meals at this time. Labs and medications reviewed. No further nutrition interventions warranted at this time. RD contact information provided. If additional nutrition issues arise, please re-consult RD.   Luke [name] A. Jimmye Norman, RD, LDN, CDE Pager: [phone] After hours Pager: [phone]

## 2017-11-14 NOTE — Consult Note (Signed)
[facility] CM Primary Care Navigator  11/14/2017  Luke [name] [birth year] [id number]    Met with patientat the bedside to identify possible discharge needs. Patient reports that he had "stomach cramps and abdominal pain radiating to chest area" thathad led to this admission.CT abdomen/ pelvis showed acute pancreatitis.   PatientendorsesDr. Elsie [name] with [facility] as [facility]primary [facility] provider.   [facility] [address] [facility].   Patient reports that he has been managinghis medications at home using "own organizer" to track down medication intake.  Per patient, he hasbeen driving prior to admission but wife Luke [name]) will be able to provide transportation tohisdoctors'appointments if needed after discharge.  Patient reports being independent with all mobility prior to admission but wife will be the primary caregiver if needed at home.  Anticipated discharge plan ishomeper patient.  Patient expressed understanding to call primary care provider's officewhen he gets homefor a post discharge follow-up appointment within1- 2 weeksor sooner if needs arise.Patient letter (with PCP's contact number) wasprovided as a reminder.  Patient has history significant withhypertension, hyperlipidemia, type 2 diabetes mellitus, repaired AAA -abdominal aortic aneurysm, atrial fibrillation on not on anticoagulants, third-degree AV block with ICD placement, left bundle blockade, coronary artery disease and peripheral artery disease.  Patient expressed the need for further help in managing DM, lowering down and maintaining A1c. Per chart review, last A1cis 8.7 on 07/07/17- poorly controlled. Most recent hemoglobin A1c is 7.4 on 11/12/17. Patient is takingMetformin, Glipizide and Lantusat home.   He reports being carbohydrate sensitive; checking blood sugar  twice a day, exercising (cardio), taking medications and following up with provider when needed.  Patient would like further information and guidance on diet (food to eat and to avoid) and need to reinforcewith diet restrictions.  Explained to patientabout [facility] care management servicesand resourcesavailable forfurther health management at home andvoiced interest aboutit. Patientexpressedwillingness to havetelephone calls and hadverbally agreed/ opted for referralto [place] for further disease information and management of chronic conditions.   Referral made to [place] forfollow-upof needsafter discharge,provide information and reinforce disease management of chronic conditions(mainly DM) at home.  THNcare managementinformation provided for future needs that may arise.    For additional questions please contact:  Luke [name], BSN, RN-BC [facility] PRIMARY CARE Navigator Cell: [phone]

## 2017-11-14 NOTE — Progress Notes (Signed)
Reviewed all discharge instructions and patient and his wife stated understanding.  Patient confirmed he has clothing and upper and lower dentures as personal belongings he brought to hospital on admission.

## 2017-11-14 NOTE — Discharge Summary (Signed)
Physician Discharge Summary  Luke [name] STM:[id number] DOB: [birth year] DOA: 11/10/2017  PCP: Tonia Ghent, MD  Admit date: 11/10/2017 Discharge date: 11/14/2017  Time spent: 45 minutes  Recommendations for Outpatient Follow-up:  1. *Follow-up PCP in 2 weeks   Discharge Diagnoses:  Principal Problem:   Acute pancreatitis Active Problems:   Type 2 diabetes mellitus with vascular disease ([place])   Essential hypertension, benign   CAD (coronary artery disease)   HLD (hyperlipidemia)   CKD (chronic kidney disease), stage III (HCC)   SIRS (systemic inflammatory response syndrome) ([place])   Pancreatitis   Discharge Condition: Stable  Diet recommendation: Low-fat diet  Filed Weights   11/12/17 0415 11/13/17 0506 11/14/17 0157  Weight: 85.2 kg (187 lb 12.8 oz) 86.3 kg (190 lb 4.8 oz) 87.8 kg (193 lb 8 oz)    History of present illness:   71 y.o.malewith medical history significant ofhypertension, hyperlipidemia, diabetes mellitus, repaired AAA, atrial fibrillation on not on anticoagulants, third-degree AV block with ICD placement, left bundle blockade, CAD, PAD, who presents with abdominal pain. CT abdomen/pelvis showed acute pancreatitis. Patient is improving , met SIRS criteria , hence  inpatient .      Hospital Course:  Acute pancreatitis-resolved, patient presented with lipase of 1043, it has improved to 28 today.  CT abdomen and pelvis showed acute pancreatitis.  Patient was kept n.p.o. given IV fluids.  Ultrasound of the abdomen showed normal CBD no evidence of bili stone.  Triglyceride 129.  Patient has been on high-dose lisinopril which has been discontinued as 1 of the probable causes of patient's pancreatitis.  Patient also has history of hypertriglyceridemia but triglycerides have been normal during this admission.  He did drink one beer on July 4.  Recommended to continue with Zocor and avoid alcohol.  Leukocytosis-resolved, likely reactive.  Patient came with  significant leukocytosis of 39,000 was empirically started on IV Zosyn.  Has completed 5 days of therapy in the hospital.  Will discontinue IV Zosyn.  WBC has improved to 17,000.  He continues to be febrile.  Blood cultures x2 have been negative.  Does not appear to be septic.  Diabetes mellitus-lasting globin A1c 8.7, repeat A1c 7.4 continue metformin, glipizide, Lantus   Hypertension-lisinopril has been discontinued as possible etiology for pancreatitis, continue amlodipine, Coreg, hydralazine. Dose of amlodipine has been changed to 10 mg p.o. daily.      Procedures:  None  Consultations:  None  Discharge Exam: Vitals:   11/14/17 0724 11/14/17 1125  BP: (!) 163/80 (!) 154/72  Pulse: 71 62  Resp: 18 18  Temp: 98.4 F (36.9 C) 98 F (36.7 C)  SpO2: 97% 98%    General: Appears in no acute distress Cardiovascular: S1-S2, regular Respiratory: Clear bilaterally  Discharge Instructions   Discharge Instructions    Diet - low sodium heart healthy   Complete by:  As directed    Increase activity slowly   Complete by:  As directed      Allergies as of 11/14/2017      Reactions   Aldactone [spironolactone] Other (See Comments)   Hyperkalemia   Codeine Rash, Hives   Januvia [sitagliptin] Other (See Comments)   Diarrhea and heart racing      Medication List    STOP taking these medications   lisinopril 20 MG tablet Commonly known as:  PRINIVIL,ZESTRIL     TAKE these medications   amLODipine 10 MG tablet Commonly known as:  NORVASC Take 1 tablet (10 mg total) by  mouth daily. Start taking on:  11/15/2017 What changed:    medication strength  how much to take   aspirin EC 81 MG tablet Take 81 mg by mouth daily.   carvedilol 25 MG tablet Commonly known as:  COREG TAKE 1 TABLET BY MOUTH TWICE A DAY WITH MEALS   glipiZIDE 5 MG tablet Commonly known as:  GLUCOTROL Take 2 tablets (10 mg total) by mouth 2 (two) times daily before a meal.   glucose blood test  strip Commonly known as:  ACCU-CHEK AVIVA PLUS CHECK BLOOD SUGAR TWICE A DAY AND AS DIRECTED. DX. E11.59   hydrALAZINE 25 MG tablet Commonly known as:  APRESOLINE Take 1 tablet (25 mg total) by mouth 3 (three) times daily.   Insulin Glargine 100 UNIT/ML Solostar Pen Commonly known as:  LANTUS SOLOSTAR Inject 5-20 Units into the skin daily at 10 pm. What changed:  how much to take   Insulin Pen Needle 31G X 5 MM Misc Commonly known as:  PENTIPS Use daily with insulin pen   metFORMIN 500 MG tablet Commonly known as:  GLUCOPHAGE take 2 tablets by mouth twice a day with food   multivitamin tablet Take 1 tablet by mouth daily.   sildenafil 20 MG tablet Commonly known as:  REVATIO TAKE THREE TO FOUR TABLETS BY MOUTH DAILY AS NEEDED What changed:  See the new instructions.   simvastatin 40 MG tablet Commonly known as:  ZOCOR Take 1 tablet (40 mg total) by mouth at bedtime.   vitamin B-12 500 MCG tablet Commonly known as:  CYANOCOBALAMIN Take 500 mcg by mouth daily.   VITAMIN D PO Take 1,200 Int'l Units by mouth daily.      Allergies  Allergen Reactions  . Aldactone [Spironolactone] Other (See Comments)    Hyperkalemia  . Codeine Rash and Hives  . Januvia [Sitagliptin] Other (See Comments)    Diarrhea and heart racing   Follow-up Information    Tonia Ghent, MD Follow up in 2 week(s).   Specialty:  Family Medicine Contact information: [address] [address] [phone]            The results of significant diagnostics from this hospitalization (including imaging, microbiology, ancillary and laboratory) are listed below for reference.    Significant Diagnostic Studies: Dg Chest 2 View  Result Date: 11/10/2017 CLINICAL DATA:  72 year old male with abdominal and chest pain for several days. Abnormal EKG. Vomiting. EXAM: CHEST - 2 VIEW COMPARISON:  Chest radiographs 08/23/2013 and earlier. FINDINGS: Lower lung volumes with increased  bibasilar reticular opacity. Stable left chest cardiac AICD. Stable cardiac size and mediastinal contours. Visualized tracheal air column is within normal limits. No pneumothorax. Upper lobe pulmonary vascularity is normal. Attenuation of upper lung markings raises the possibility of chronic emphysema. No pleural effusion or confluent pulmonary opacity. No acute osseous abnormality identified. Negative visible bowel gas pattern. IMPRESSION: 1. Lower lung volumes with reticular bibasilar opacity. Differential considerations include atelectasis, progressive chronic interstitial lung disease, and less likely viral/atypical respiratory infection. 2. No pleural effusion or other acute cardiopulmonary abnormality. Electronically Signed   By: Genevie Ann M.D.   On: 11/10/2017 18:51   Ct Abdomen Pelvis W Contrast  Result Date: 11/10/2017 CLINICAL DATA:  Abdominal cramping moving to the center of the chest for several days. Abnormal EKG with episode of vomiting last evening and indigestion today. EXAM: CT ABDOMEN AND PELVIS WITH CONTRAST TECHNIQUE: Multidetector CT imaging of the abdomen and pelvis was performed  using the standard protocol following bolus administration of intravenous contrast. CONTRAST:  137m OMNIPAQUE IOHEXOL 300 MG/ML  SOLN COMPARISON:  07/01/2012 FINDINGS: Lower chest: Heart size is normal. Right atrial pacer, right ventricular ICD and coronary sinus leads are noted. No pericardial effusion. Subpleural blebs and bullae noted at each lung base with areas of subpleural fibrosis bilaterally. Hepatobiliary: No focal liver abnormality is seen. No gallstones, gallbladder wall thickening, or biliary dilatation. Pancreas: Peripancreatic inflammation without ductal dilatation, enhancing mass or pseudocyst formation. Spleen: Normal Adrenals/Urinary Tract: Normal bilateral adrenal glands and kidneys without enhancing mass nor obstructive uropathy. Slightly thick-walled appearance of the urinary bladder is likely  due to underdistention. Cystitis is not entirely excluded. Stomach/Bowel: Fluid-filled distention of the stomach may reflect a gastroparesis. No bowel obstruction. Increased colonic stool burden is noted. Normal-appearing appendix is visualized. Vascular/Lymphatic: Patent bifurcating infrarenal aortic stent graft with further decrease in size of the native aneurysm now measuring up to 2.6 cm transverse, previously up to 6.4 cm. Mild-to-moderate atherosclerosis of the suprarenal native aorta. Patent branch vessels without occlusions. Reproductive: Top-normal size prostate. Unremarkable seminal vesicles. Other: Small amount of free fluid in the pelvis. Musculoskeletal: Degenerative change along the dorsal spine. IMPRESSION: 1. Peripancreatic inflammation compatible with acute uncomplicated pancreatitis. Associated small volume of free fluid in the pelvis. 2. Fluid-filled distention of the adjacent stomach compatible with gastroparesis possibly on the basis of adjacent pancreatitis. 3. Further decrease in native infrarenal abdominal aortic aneurysm now down to 2.6 cm transverse from 6.4 cm. Patent indwelling bifurcating aortoiliac stent. Electronically Signed   By: DAshley RoyaltyM.D.   On: 11/10/2017 21:42   UKoreaAbdomen Limited Ruq  Result Date: 11/11/2017 CLINICAL DATA:  Pancreatitis EXAM: ULTRASOUND ABDOMEN LIMITED RIGHT UPPER QUADRANT COMPARISON:  CT from earlier today FINDINGS: Gallbladder: No gallstones or wall thickening visualized. No sonographic Murphy sign noted by sonographer. Common bile duct: Diameter: 3 mm. Limited coverage that is negative for visible filling defect Liver: No focal lesion identified. Within normal limits in parenchymal echogenicity. Portal vein is patent on color Doppler imaging with normal direction of blood flow towards the liver. IMPRESSION: 1. No evidence of biliary stone. 2. Limited visualization of the common bile duct with normal diameter. Electronically Signed   By: JMonte FantasiaM.D.   On: 11/11/2017 01:18    Microbiology: Recent Results (from the past 240 hour(s))  Culture, blood (Routine X 2) w Reflex to ID Panel     Status: None (Preliminary result)   Collection Time: 11/11/17  1:22 AM  Result Value Ref Range Status   Specimen Description BLOOD LEFT ANTECUBITAL  Final   Special Requests   Final    BOTTLES DRAWN AEROBIC ONLY Blood Culture adequate volume   Culture   Final    NO GROWTH 3 DAYS Performed at [facility] [address], [place] [address]   Report Status PENDING  Incomplete  Culture, blood (Routine X 2) w Reflex to ID Panel     Status: None (Preliminary result)   Collection Time: 11/11/17  1:28 AM  Result Value Ref Range Status   Specimen Description BLOOD LEFT ANTECUBITAL  Final   Special Requests   Final    BOTTLES DRAWN AEROBIC ONLY Blood Culture adequate volume   Culture   Final    NO GROWTH 3 DAYS Performed at [facility] [address], [place] [address]   Report Status PENDING  Incomplete     Labs: Basic  Metabolic Panel: Recent Labs  Lab 11/10/17 1805 11/11/17 0130 11/12/17 0601 11/12/17 0941 11/13/17 0557 11/14/17 0312  NA 133* 132* 131*  --  131* 130*  K 4.7 4.1 3.9  --  3.8 4.0  CL 94* 93* 99  --  99 97*  CO2 _0 --  24 24  GLUCOSE 318* 331* 244*  --  197* 226*  BUN 30* 24* 17  --  14 13  CREATININE 1.38* 1.24 1.20  --  1.00 1.05  CALCIUM 9.9 9.2 8.0*  --  7.9* 7.9*  MG  --   --   --  1.6*  --   --    Liver Function Tests: Recent Labs  Lab 11/10/17 1805 11/11/17 0130 11/12/17 0601 11/14/17 0312  AST 44* 32 19 40  ALT 67* 54* 30 46*  ALKPHOS 84 74 57 55  BILITOT 1.3* 1.9* 1.1 0.9  PROT 7.3 6.8 5.8* 5.3*  ALBUMIN 4.0 3.6 2.7* 2.3*   Recent Labs  Lab 11/10/17 1805 11/14/17 0312  LIPASE 1,043* 28   No results for input(s): AMMONIA in the last 168 hours. CBC: Recent Labs  Lab 11/10/17 1805 11/11/17 0130 11/12/17 0601 11/13/17 0557 11/14/17 0312  WBC  36.8* 39.3* 34.9* 24.2* 17.5*  NEUTROABS  --   --  32.2* 21.3* 14.6*  HGB 16.0 15.2 13.1 12.1* 11.7*  HCT 46.0 43.5 39.1 35.7* 35.1*  MCV 94.3 93.8 96.1 95.5 95.6  PLT 293 236 195 187 220    CBG: Recent Labs  Lab 11/13/17 1119 11/13/17 1627 11/13/17 2202 11/14/17 0810 11/14/17 1145  GLUCAP 238* 224* 204* 184* 164*       Signed:  Oswald Hillock MD.  [facility] 11/14/2017, 12:56 PM

## 2017-11-15 ENCOUNTER — Other Ambulatory Visit: Payer: Self-pay | Admitting: Family Medicine

## 2017-11-17 LAB — CULTURE, BLOOD (ROUTINE X 2)
Culture: NO GROWTH
Culture: NO GROWTH
Special Requests: ADEQUATE
Special Requests: ADEQUATE

## 2017-11-18 ENCOUNTER — Ambulatory Visit (INDEPENDENT_AMBULATORY_CARE_PROVIDER_SITE_OTHER): Payer: Medicare Other | Admitting: Family Medicine

## 2017-11-18 ENCOUNTER — Encounter: Payer: Self-pay | Admitting: Family Medicine

## 2017-11-18 VITALS — BP 130/58 | HR 69 | Temp 98.2°F | Ht 75.0 in | Wt 181.2 lb

## 2017-11-18 DIAGNOSIS — R1310 Dysphagia, unspecified: Secondary | ICD-10-CM | POA: Diagnosis not present

## 2017-11-18 DIAGNOSIS — K859 Acute pancreatitis without necrosis or infection, unspecified: Secondary | ICD-10-CM

## 2017-11-18 DIAGNOSIS — I255 Ischemic cardiomyopathy: Secondary | ICD-10-CM

## 2017-11-18 DIAGNOSIS — E1159 Type 2 diabetes mellitus with other circulatory complications: Secondary | ICD-10-CM

## 2017-11-18 LAB — LEUKOCYTE ALKALINE PHOSPHATASE: Leukocyte Alkaline  Phos Stain: 227 — ABNORMAL HIGH (ref 25–130)

## 2017-11-18 MED ORDER — OMEPRAZOLE 20 MG PO CPDR: 20.0000 mg | DELAYED_RELEASE_CAPSULE | Freq: Every day | ORAL | Status: DC

## 2017-11-18 MED ORDER — INSULIN GLARGINE 100 UNIT/ML SOLOSTAR PEN: 20.0000 [IU] | PEN_INJECTOR | Freq: Every day | SUBCUTANEOUS | Status: DC

## 2017-11-18 MED ORDER — SILDENAFIL CITRATE 20 MG PO TABS: ORAL_TABLET | ORAL | Status: DC

## 2017-11-18 NOTE — Progress Notes (Signed)
Admit date: 11/10/2017 Discharge date: 11/14/2017  Time spent: 45 minutes  Recommendations for Outpatient Follow-up:  1. *Follow-up PCP in 2 weeks   Discharge Diagnoses:  Principal Problem:   Acute pancreatitis Active Problems:   Type 2 diabetes mellitus with vascular disease ([place])   Essential hypertension, benign   CAD (coronary artery disease)   HLD (hyperlipidemia)   CKD (chronic kidney disease), stage III (HCC)   SIRS (systemic inflammatory response syndrome) ([place])   Pancreatitis   Discharge Condition: Stable Diet recommendation: Low-fat diet       Filed Weights   11/12/17 0415 11/13/17 0506 11/14/17 0157  Weight: 85.2 kg (187 lb 12.8 oz) 86.3 kg (190 lb 4.8 oz) 87.8 kg (193 lb 8 oz)    History of present illness:   72 y.o.malewith medical history significant ofhypertension, hyperlipidemia, diabetes mellitus, repaired AAA, atrial fibrillation on not on anticoagulants, third-degree AV block with ICD placement, left bundle blockade, CAD, PAD, who presents with abdominal pain. CT abdomen/pelvis showed acute pancreatitis. Patientis improving , met SIRS criteria, was admitted.    Hospital Course:  Acute pancreatitis-resolved, patient presented with lipase of 1043, it has improved to 28 today.  CT abdomen and pelvis showed acute pancreatitis.  Patient was kept n.p.o. given IV fluids.  Ultrasound of the abdomen showed normal CBD no evidence of bili stone.  Triglyceride 129.  Patient has been on high-dose lisinopril which has been discontinued as one of the probable causes of patient's pancreatitis.  Patient also has history of hypertriglyceridemia but triglycerides have been normal during this admission.  He did drink one beer on July 4.  Recommended to continue with Zocor and avoid alcohol.  Leukocytosis-resolved, likely reactive.  Patient came with significant leukocytosis of 39,000 was empirically started on IV Zosyn.  Has completed 5 days of therapy in the  hospital.  Will discontinue IV Zosyn.  WBC has improved to 17,000.  He continues to be febrile.  Blood cultures x2 have been negative.  Does not appear to be septic.  Diabetes mellitus-lasting globin A1c 8.7, repeat A1c 7.4 continue metformin, glipizide, Lantus   Hypertension-lisinopril has been discontinued as possible etiology for pancreatitis, continue amlodipine, Coreg, hydralazine. Dose of amlodipine has been changed to 10 mg p.o. daily.  Procedures:  None  Consultations:  None  Discharge Exam:     Vitals:   11/14/17 0724 11/14/17 1125  BP: (!) 163/80 (!) 154/72  Pulse: 71 62  Resp: 18 18  Temp: 98.4 F (36.9 C) 98 F (36.7 C)  SpO2: 97% 98%    General: Appears in no acute distress Cardiovascular: S1-S2, regular Respiratory: Clear bilaterally  Discharge Instructions       Discharge Instructions    Diet - low sodium heart healthy   Complete by:  As directed    Increase activity slowly   Complete by:  As directed           Allergies as of 11/14/2017      Reactions   Aldactone [spironolactone] Other (See Comments)   Hyperkalemia   Codeine Rash, Hives   Januvia [sitagliptin] Other (See Comments)   Diarrhea and heart racing         Medication List    STOP taking these medications   lisinopril 20 MG tablet Commonly known as:  PRINIVIL,ZESTRIL     TAKE these medications   amLODipine 10 MG tablet Commonly known as:  NORVASC Take 1 tablet (10 mg total) by mouth daily. Start taking on:  11/15/2017 What changed:  medication strength  how much to take   aspirin EC 81 MG tablet Take 81 mg by mouth daily.   carvedilol 25 MG tablet Commonly known as:  COREG TAKE 1 TABLET BY MOUTH TWICE A DAY WITH MEALS   glipiZIDE 5 MG tablet Commonly known as:  GLUCOTROL Take 2 tablets (10 mg total) by mouth 2 (two) times daily before a meal.   glucose blood test strip Commonly known as:  ACCU-CHEK AVIVA PLUS CHECK BLOOD SUGAR  TWICE A DAY AND AS DIRECTED. DX. E11.59   hydrALAZINE 25 MG tablet Commonly known as:  APRESOLINE Take 1 tablet (25 mg total) by mouth 3 (three) times daily.   Insulin Glargine 100 UNIT/ML Solostar Pen Commonly known as:  LANTUS SOLOSTAR Inject 5-20 Units into the skin daily at 10 pm. What changed:  how much to take   Insulin Pen Needle 31G X 5 MM Misc Commonly known as:  PENTIPS Use daily with insulin pen   metFORMIN 500 MG tablet Commonly known as:  GLUCOPHAGE take 2 tablets by mouth twice a day with food   multivitamin tablet Take 1 tablet by mouth daily.   sildenafil 20 MG tablet Commonly known as:  REVATIO TAKE THREE TO FOUR TABLETS BY MOUTH DAILY AS NEEDED   simvastatin 40 MG tablet Commonly known as:  ZOCOR Take 1 tablet (40 mg total) by mouth at bedtime.   vitamin B-12 500 MCG tablet Commonly known as:  CYANOCOBALAMIN Take 500 mcg by mouth daily.   VITAMIN D PO Take 1,200 Int'l Units by mouth daily.     ===================================================== Inpatient course discussed with patient.  He had abdominal pain.  Was admitted with pancreatitis.  He had imaging and laboratory values consistent with pancreatitis.  He gradually improved.  He does not drink.  There was no clear cause for the pancreatitis seen.  He did not have gallstones.  He was empirically treated with antibiotics and gradually improved.  He was discharged home.  Here for follow-up today.  In the meantime sugar has been ~130s-160s in the AM,160-200s in the PM.  No low sugars.  No FCNAVD.  No pain. Eating well.  He clearly feels better compared to admission.  He had some esophageal dysphagia with slowing of transit at mid esophagus level with swallowing solids and with big gulps of liquids also.  This is only since the hospitalization.  Better in the meantime.    He had a little fluid in his ankles recently.  He is on higher dose of amlodipine.   He is not short of breath  otherwise.  PMH and SH reviewed  ROS: Per HPI unless specifically indicated in ROS section   Meds, vitals, and allergies reviewed.   GEN: nad, alert and oriented HEENT: mucous membranes moist NECK: supple w/o LA CV: rrr.  PULM: ctab, no inc wob ABD: soft, +bs, not ttp  EXT: trace BLE edema SKIN: no acute rash

## 2017-11-18 NOTE — Patient Instructions (Addendum)
Go to the lab on the way out.  We'll contact you with your lab report. I would expect you to have some changes in insulin requirement in the near future.  Update me as needed. The goal is to avoid lows and prevent extreme highs.  I wouldn't change by more than 1 unit per day.   Ask to get the medicare and 3 month follow up changed to ~02/2018 (with [facility] and Damita Dunnings).  Chew well and start taking prilosec 20mg  a day.  Let me know if the swallowing doesn't get better.  Take care.  Glad to see you.

## 2017-11-19 DIAGNOSIS — R1310 Dysphagia, unspecified: Secondary | ICD-10-CM | POA: Insufficient documentation

## 2017-11-19 LAB — CBC WITH DIFFERENTIAL/PLATELET
Basophils Absolute: 0.1 10*3/uL (ref 0.0–0.1)
Basophils Relative: 0.5 % (ref 0.0–3.0)
Eosinophils Absolute: 0.4 10*3/uL (ref 0.0–0.7)
Eosinophils Relative: 3.1 % (ref 0.0–5.0)
HCT: 36.6 % — ABNORMAL LOW (ref 39.0–52.0)
Hemoglobin: 12.5 g/dL — ABNORMAL LOW (ref 13.0–17.0)
Lymphocytes Relative: 19.6 % (ref 12.0–46.0)
Lymphs Abs: 2.4 10*3/uL (ref 0.7–4.0)
MCHC: 34.3 g/dL (ref 30.0–36.0)
MCV: 96.1 fl (ref 78.0–100.0)
Monocytes Absolute: 1.2 10*3/uL — ABNORMAL HIGH (ref 0.1–1.0)
Monocytes Relative: 10 % (ref 3.0–12.0)
Neutro Abs: 8.2 10*3/uL — ABNORMAL HIGH (ref 1.4–7.7)
Neutrophils Relative %: 66.8 % (ref 43.0–77.0)
Platelets: 462 10*3/uL — ABNORMAL HIGH (ref 150.0–400.0)
RBC: 3.8 Mil/uL — ABNORMAL LOW (ref 4.22–5.81)
RDW: 13 % (ref 11.5–15.5)
WBC: 12.3 10*3/uL — ABNORMAL HIGH (ref 4.0–10.5)

## 2017-11-19 LAB — COMPREHENSIVE METABOLIC PANEL
ALT: 66 U/L — ABNORMAL HIGH (ref 0–53)
AST: 28 U/L (ref 0–37)
Albumin: 3.5 g/dL (ref 3.5–5.2)
Alkaline Phosphatase: 65 U/L (ref 39–117)
BUN: 23 mg/dL (ref 6–23)
CO2: 27 mEq/L (ref 19–32)
Calcium: 9.3 mg/dL (ref 8.4–10.5)
Chloride: 98 mEq/L (ref 96–112)
Creatinine, Ser: 1.08 mg/dL (ref 0.40–1.50)
GFR: 71.61 mL/min (ref >60.00)
Glucose, Bld: 225 mg/dL — ABNORMAL HIGH (ref 70–99)
Potassium: 5.3 mEq/L — ABNORMAL HIGH (ref 3.5–5.1)
Sodium: 133 mEq/L — ABNORMAL LOW (ref 135–145)
Total Bilirubin: 0.3 mg/dL (ref 0.2–1.2)
Total Protein: 6.7 g/dL (ref 6.0–8.3)

## 2017-11-19 NOTE — Assessment & Plan Note (Signed)
I expect him to have some insulin adjustment needs in the next few days given the recent illness.  He can adjust by 1 unit/day if he is having low sugars or he is having significantly elevated sugars.  He agrees.  See after visit summary.  He will update me as needed.

## 2017-11-19 NOTE — Assessment & Plan Note (Signed)
He had some esophageal dysphagia with slowing of transit at mid esophagus level with swallowing solids and with big gulps of liquids also.  This is only since the hospitalization.  Better in the meantime.  Since this is better in the meantime and it only happened since the recent illness, he does not have to see GI at this point.  I offered referral but he wanted to defer for now.  This is reasonable.  Reasonable to use a PPI in the meantime and update me if the symptoms do not completely resolve.  Routine cautions given and he agrees with plan.

## 2017-11-19 NOTE — Assessment & Plan Note (Signed)
He is clearly better in the meantime.  He did not drink much alcohol.  He only had one beer recently.  He is not drinking in the meantime.  He did not have gallstones.  Unclear if the symptoms were related to lisinopril.  Discussed with patient.  I have never known to the patient to have that reaction but it is theoretically possible.  He is off the medication in the meantime.  He feels fine and his blood pressure is controlled with his current medications.  It is probably reasonable to continue the amlodipine as is for now but if he has a lot more swelling he can let me know.  Reasonable to recheck routine labs today.  See notes on labs.  Routine cautions given.  Still okay for outpatient follow-up.  See after visit summary.

## 2017-11-21 ENCOUNTER — Other Ambulatory Visit: Payer: Self-pay | Admitting: *Deleted

## 2017-11-21 NOTE — Patient Outreach (Signed)
[place] [facility]) Care Management  11/21/2017  Luke [name] 09/30/1945 [id number]   RN Health Coach Introductory Call  Referral Date:  11/14/2017 Referral Source:  Hospital Liaison Reason for Referral:  Disease Management Education Insurance:  Medicare   Outreach Attempt:  Successful telephone outreach to patient for introductory call.  HIPAA verified with patient.  RN Health Coach introduced self and role.  Patient verbally agrees to monthly telephone outreaches.  Plan:  RN Health Coach will attempt outreach to complete initial telephone assessment within the next 10 business days.   [place] [phone] [name]

## 2017-12-01 ENCOUNTER — Ambulatory Visit: Payer: Medicare Other

## 2017-12-03 ENCOUNTER — Other Ambulatory Visit: Payer: Self-pay | Admitting: *Deleted

## 2017-12-03 ENCOUNTER — Ambulatory Visit (INDEPENDENT_AMBULATORY_CARE_PROVIDER_SITE_OTHER): Payer: Medicare Other | Admitting: Cardiology

## 2017-12-03 ENCOUNTER — Encounter: Payer: Self-pay | Admitting: Cardiology

## 2017-12-03 VITALS — BP 152/78 | HR 64 | Ht 74.0 in | Wt 180.0 lb

## 2017-12-03 DIAGNOSIS — I739 Peripheral vascular disease, unspecified: Secondary | ICD-10-CM | POA: Diagnosis not present

## 2017-12-03 DIAGNOSIS — K858 Other acute pancreatitis without necrosis or infection: Secondary | ICD-10-CM | POA: Diagnosis not present

## 2017-12-03 DIAGNOSIS — I10 Essential (primary) hypertension: Secondary | ICD-10-CM

## 2017-12-03 DIAGNOSIS — Z95810 Presence of automatic (implantable) cardiac defibrillator: Secondary | ICD-10-CM

## 2017-12-03 DIAGNOSIS — I447 Left bundle-branch block, unspecified: Secondary | ICD-10-CM

## 2017-12-03 DIAGNOSIS — I255 Ischemic cardiomyopathy: Secondary | ICD-10-CM

## 2017-12-03 NOTE — Patient Outreach (Signed)
[place] [facility]) Care Management  12/03/2017  KRISTJAN DERNER 10/27/1945 [id number]   RN Health Coach Initial Assessment  Referral Date:  11/14/2017 Referral Source:  Hospital Liaison Reason for Referral:  Disease Management Education Insurance:  Medicare   Outreach Attempt:  Outreach attempt #1 to patient for initial telephone assessment.  Patient answered and verified HIPAA.  States he is unable to complete telephone assessment at this time and is requesting a telephone call back.  Plan:  RN Health Coach will make another outreach to complete initial telephone assessment within the next 10 business days per patient request.   Hubert Azure RN [place] [phone] [email or web address]

## 2017-12-03 NOTE — Progress Notes (Signed)
Cardiology Office Note    Date:  12/03/2017   ID:  Luke [name], DOB 01-15-1946, MRN [id number]  PCP:  Tonia [place], MD  Cardiologist:   Candee Furbish, MD (former [name])    History of Present Illness:  Luke [name] is a 72 y.o. male [name]) who presents today to follow-up coronary disease and cardiomyopathy. EF 30% now normal. Former patient of Dr. [name].  Prior cervical disc surgery. This has been done and he is feeling much better. He still has numbness in his hands which will hopefully slowly improved.   [place] 2013 - 2V CAD Left anterior descending (LAD): focal 70-80% proximal. Long 40% mid vessel. Right coronary artery (RCA): Diffusely diseased. 50-60% mid vessel, 70% distal, 70% at bifurcation of PDA/PLOM.  LV Ejection Fraction: 30%. He underwent catheterization which confirmed severe LV dysfunction with moderate to severe 2 vessel disease not thought to be appropriate for intervention.  Repeat echocardiogram in 2019 showed normal EF.  He's done quite well since resynchronization therapy and still goes to [employer], exercises on treadmill, no anginal symptoms, peripheral vascular disease seems to be stable, no syncope, no orthopnea, no bleeding.  12/03/17 -since last visit, developed acute pancreatitis 11/10/2017, lipase 1000.  No biliary stone.  High-dose lisinopril was discontinued as one possible cause of pancreatitis.  Triglycerides were normal during that admission.  No significant alcohol, in fact he states he only had one beer on the July 4.  EF is now normal LDL 63 triglycerides 138 creatinine 1.1.  He is working on his diabetes.  Hemoglobin A1c improved.  He did walk on a shell at the beach.  His foot is somewhat sore but he does not have an open wound.  He is going to get this looked at.  He also went to the eye doctor who saw plaques in his retina.  Carotid Dopplers were overall reassuring with mild lack bilaterally.  Past Medical History:  Diagnosis Date  . AAA  (abdominal aortic aneurysm) ([place]) 2011   Per vascular surgery  . Anxiety    no med. in use for anxiety but pt. speaks openly of his stress & anxious feelings regarding impending surgery    . Atrial fibrillation ([place])   . Automatic implantable cardioverter-defibrillator in situ   . CAD (coronary artery disease)    Presumed CAD with nuclear scan October 09, 2011,  large anteroseptal MI and inferior MI. Catheterization scheduled October 15, 2011  . Cardiomyopathy [facility]    Nuclear, October 09, 2011, EF 30%, multiple focal wall motion abnormalities  . Diabetes mellitus    type II  . Drug therapy    Hyperkalemia with spironolactone  . Ejection fraction < 50%    EF 30%, nuclear, October 09, 2011  . Ejection fraction < 50%    EF previously 30%  //   EF 30-35%, echo, July 14, 2012, severe diffuse hypokinesis, PA pressure 43 mm mercury  . Fall due to ice or snow Feb.  19, 2015  . HLD (hyperlipidemia)   . Hypertension    white coat HTN-- often elevated in office and controlled on outside checks.  . ICD (implantable cardioverter-defibrillator) in place    CRT-D placed March, 2014 complete heart block and k dysfunction  . LBBB (left bundle branch block)    LBBB on EKG October 11, 2011,  no prior EKG has been done  . Low testosterone    Hx of  . Myocardial infarction ([place])   . Pacemaker   .  PAD (peripheral artery disease) ([place])     Past Surgical History:  Procedure Laterality Date  . ABDOMINAL AORTAGRAM N/A 10/28/2011   Procedure: ABDOMINAL [name];  Surgeon: Angelia Mould, MD;  Location: [facility] CATH LAB;  Service: Cardiovascular;  Laterality: N/A;  . ABDOMINAL AORTIC ANEURYSM REPAIR     EVAR   . BI-VENTRICULAR IMPLANTABLE CARDIOVERTER DEFIBRILLATOR N/A 07/16/2012   Procedure: BI-VENTRICULAR IMPLANTABLE CARDIOVERTER DEFIBRILLATOR  (CRT-D);  Surgeon: Deboraha Sprang, MD;  Location: [facility] CATH LAB;  Service: Cardiovascular;  Laterality: N/A;  . CARDIAC CATHETERIZATION    . COLONOSCOPY    . HERNIA REPAIR   Jan. 9, 2015  . INSERTION OF MESH N/A 05/14/2013   Procedure: INSERTION OF MESH;  Surgeon: Adin Hector, MD;  Location: [place];  Service: General;  Laterality: N/A;  . LOWER EXTREMITY ANGIOGRAM Bilateral 10/28/2011   Procedure: LOWER EXTREMITY ANGIOGRAM;  Surgeon: Angelia Mould, MD;  Location: [facility] CATH LAB;  Service: Cardiovascular;  Laterality: Bilateral;  . PACEMAKER INSERTION  07-16-12   pacemaker/defibrilator  . POSTERIOR CERVICAL FUSION/FORAMINOTOMY N/A 06/09/2014   Procedure: Laminectomy - Cervical two-Cervcial four posterior cervical instrumented fusion Cervical two-cervical four;  Surgeon: Eustace Moore, MD;  Location: [place] NEURO ORS;  Service: Neurosurgery;  Laterality: N/A;  posterior   . TONSILLECTOMY     as a child   . UMBILICAL HERNIA REPAIR N/A 05/14/2013   Procedure: LAPAROSCOPIC exploration and repair of hernia in abdominal ;  Surgeon: Adin Hector, MD;  Location: [place];  Service: General;  Laterality: N/A;    Outpatient Medications Prior to Visit  Medication Sig Dispense Refill  . amLODipine (NORVASC) 10 MG tablet Take 1 tablet (10 mg total) by mouth daily. 30 tablet 3  . aspirin EC 81 MG tablet Take 81 mg by mouth daily.    . carvedilol (COREG) 25 MG tablet TAKE 1 TABLET BY MOUTH TWICE A DAY WITH MEALS 90 tablet 3  . Cholecalciferol (VITAMIN D PO) Take 1,200 Int'l Units by mouth daily.    Marland Kitchen glipiZIDE (GLUCOTROL) 5 MG tablet TAKE 2 TABLETS(10 MG) BY MOUTH TWICE DAILY BEFORE A MEAL 360 tablet 0  . glucose blood (ACCU-CHEK AVIVA PLUS) test strip CHECK BLOOD SUGAR TWICE A DAY AND AS DIRECTED. DX. E11.59 100 each 5  . hydrALAZINE (APRESOLINE) 25 MG tablet Take 1 tablet (25 mg total) by mouth 3 (three) times daily. 90 tablet 2  . Insulin Pen Needle (PENTIPS) 31G X 5 MM MISC Use daily with insulin pen 100 each 3  . metFORMIN (GLUCOPHAGE) 500 MG tablet TAKE 2 TABLETS BY MOUTH TWICE DAILY WITH FOOD 360 tablet 0  . Multiple Vitamin (MULTIVITAMIN) tablet Take 1 tablet by mouth  daily.     Marland Kitchen omeprazole (PRILOSEC) 20 MG capsule Take 1 capsule (20 mg total) by mouth daily.    . sildenafil (REVATIO) 20 MG tablet TAKE THREE TO FOUR TABLETS BY MOUTH DAILY AS NEEDED FOR ED    . simvastatin (ZOCOR) 40 MG tablet Take 1 tablet (40 mg total) by mouth at bedtime. 90 tablet 3  . Insulin Glargine (LANTUS SOLOSTAR) 100 UNIT/ML Solostar Pen Inject 20 Units into the skin daily at 10 pm. (Patient taking differently: Inject 25 Units into the skin daily at 10 pm. )    . vitamin B-12 (CYANOCOBALAMIN) 500 MCG tablet Take 500 mcg by mouth daily.     No facility-administered medications prior to visit.      Allergies:   Aldactone [spironolactone]; Codeine; Januvia [sitagliptin];  and Lisinopril   Social History   Socioeconomic History  . Marital status: Married    Spouse name: Not on file  . Number of children: Not on file  . Years of education: Not on file  . Highest education level: Not on file  Occupational History  . Not on file  Social Needs  . Financial resource strain: Not on file  . Food insecurity:    Worry: Not on file    Inability: Not on file  . Transportation needs:    Medical: Not on file    Non-medical: Not on file  Tobacco Use  . Smoking status: Former Smoker    Packs/day: 2.00    Years: 40.00    Pack years: 80.00    Types: Cigarettes    Last attempt to quit: 05/06/2004    Years since quitting: 13.5  . Smokeless tobacco: Never Used  Substance and Sexual Activity  . Alcohol use: Yes    Alcohol/week: 0.0 - 1.2 oz    Comment: beer or wine  occassionally  . Drug use: No  . Sexual activity: Yes  Lifestyle  . Physical activity:    Days per week: Not on file    Minutes per session: Not on file  . Stress: Not on file  Relationships  . Social connections:    Talks on phone: Not on file    Gets together: Not on file    Attends religious service: Not on file    Active member of club or organization: Not on file    Attends meetings of clubs or  organizations: Not on file    Relationship status: Not on file  Other Topics Concern  . 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       Family History:  The patient's family history includes Cancer in his father; Diabetes in his sister; Heart attack in his sister; Heart disease in his sister; Hyperlipidemia in his mother and sister; Hypertension in his mother, sister, and son; Stroke in his mother.   ROS:   Please see the history of present illness.    Review of Systems  All other systems reviewed and are negative.    PHYSICAL EXAM:   VS:  BP (!) 152/78 (BP Location: Left Arm, Patient Position: Sitting, Cuff Size: Normal)   Pulse 64   Ht 6\' 2"  (1.88 m)   Wt 180 lb (81.6 kg)   SpO2 95%   BMI 23.11 kg/m    GEN: Well nourished, well developed, in no acute distress  HEENT: normal  Neck: no JVD, carotid bruits, or masses Cardiac: RRR; no murmurs, rubs, or gallops,no edema, ICD  Respiratory:  clear to auscultation bilaterally, normal work of breathing GI: soft, nontender, nondistended, + BS MS: no deformity or atrophy  Skin: warm and dry, no rash Neuro:  Alert and Oriented x 3, Strength and sensation are intact Psych: euthymic mood, full affect    Wt Readings from Last 3 Encounters:  12/03/17 180 lb (81.6 kg)  11/18/17 181 lb 4 oz (82.2 kg)  11/14/17 193 lb 8 oz (87.8 kg)      Studies/Labs Reviewed:   EKG:  EKG is ordered today.  The ekg ordered today demonstrates 05/25/15-sinus rhythm, 66, biventricular pacing spikes noted heart rate 66 bpm. Her smoking reviewed.  Echocardiogram 07/15/12  - EF 30%  ECHO 07/16/17: - Left ventricle: The cavity size was normal. Wall thickness was  normal. Systolic function was normal. The estimated ejection   fraction was in the range of 55% to 60%. Wall motion was normal;   there were no regional wall motion abnormalities. Doppler   parameters are consistent with abnormal left ventricular    relaxation (grade 1 diastolic dysfunction). - Mitral valve: There was mild regurgitation. - Pulmonary arteries: Systolic pressure was moderately increased.   PA peak pressure: 40 mm Hg (S).  ------------------------------------------------------------------- Labs, prior tests, procedures, and surgery: Transthoracic echocardiography (07/15/2012).     EF was 30% and PA pressure was 43 (systolic).  Permanent pacemaker system implantation.    Currently implanted device: St. Judepermanent pacemaker.  [place] 2013 - 2V CAD Left anterior descending (LAD): focal 70-80% proximal. Long 40% mid vessel. Right coronary artery (RCA): Diffusely diseased. 50-60% mid vessel, 70% distal, 70% at bifurcation of PDA/PLOM.  Recent Labs: 11/12/2017: Magnesium 1.6 11/18/2017: ALT 66; BUN 23; Creatinine, Ser 1.08; Hemoglobin 12.5; Platelets 462.0; Potassium 5.3; Sodium 133   Lipid Panel    Component Value Date/Time   CHOL 132 11/11/2017 0127   TRIG 136 11/11/2017 0127   HDL 42 11/11/2017 0127   CHOLHDL 3.1 11/11/2017 0127   VLDL 27 11/11/2017 0127   LDLCALC 63 11/11/2017 0127   LDLDIRECT 71.0 07/07/2017 0822    Additional studies/ records that were reviewed today include:  Prior office notes, lab, echocardiogram, cardiac catheterization, lab work reviewed    ASSESSMENT:    1. Other acute pancreatitis, unspecified complication status   2. Essential hypertension, benign   3. PVD (peripheral vascular disease) ([place])   4. Biventricular ICD (implantable cardioverter-defibrillator) in place   5. LBBB (left bundle branch block)      PLAN:  In order of problems listed above:  Acute pancreatitis in July 2019 - Lisinopril was stopped as a possible culprit.  This seems unusual but I guess could be a possibility.  It would be nice for him to be on ACE inhibitor given his cardiomyopathy.  For now we will continue to hold.  Ischemic cardiomyopathy/left bundle-branch block  - Underwent CRT/ICD St.Jude  implantation for intermittent complete heart block in the setting of ischemic cardiomyopathy and left bundle branch block. Stable without any exertional symptoms, disease on cardiac catheterization reviewed. We did discuss previously why he did not undergo revascularization.    - Previously had not tolerated spironolactone because of hyperkalemia.  - Continuing with carvedilol at goal dosing.  Now off of lisinopril because of presumed pancreatitis etiology.  -Excellent that EF is now normal on recent echocardiogram 2019.  Coronary artery disease  - Prior cardiac catheterization demonstrated focal 80% proximal LAD lesion, diffusely diseased RCA 60% mid vessel and 70% PDA. EF was 30% at the time. No intervention was performed.  Seems stable.  Peripheral vascular disease  - He is not expressing any significant claudication during exercise. He is able to walk on the treadmill for 4 miles a day. He goes to [employer]. Vascular has been monitoring. His left leg has improved. Doing well, stable.  Essential hypertension  - On beta blocker as well as ACE inhibitor, at goal dosing. Stable.  - Sometimes his blood pressure to be elevated when he is at the doctor's office  Diabetes with PVD  - per primary team. Vascular team has been monitoring him. Continue with secondary prevention. Thankfully, his left lower extremity is much improved.  - HgA1c 7.4  Abdominal aortic aneurysm repair  - 2013. He still visiting with vascular team.  Medication Adjustments/Labs and  Tests Ordered: Current medicines are reviewed at length with the patient today.  Concerns regarding medicines are outlined above.  Medication changes, Labs and Tests ordered today are listed in the Patient Instructions below. Patient Instructions  Medication Instructions:  The current medical regimen is effective;  continue present plan and medications.  Follow-Up: Follow up in 1 year with Dr. Marlou Porch.  You will receive a letter in the  mail 2 months before you are due.  Please call us when you receive this letter to schedule your follow up appointment.  If you need a refill on your cardiac medications before your next appointment, please call your pharmacy.  Thank you for choosing [facility]!!          Signed, Candee Furbish, MD  12/03/2017 9:15 AM    [facility] [address], [place], [place]  [address] Phone: [phone]; Fax: [phone]

## 2017-12-03 NOTE — Patient Instructions (Signed)

## 2017-12-04 ENCOUNTER — Ambulatory Visit: Payer: Medicare Other

## 2017-12-04 ENCOUNTER — Inpatient Hospital Stay: Payer: Medicare Other | Admitting: Family Medicine

## 2017-12-08 ENCOUNTER — Other Ambulatory Visit: Payer: Self-pay | Admitting: *Deleted

## 2017-12-08 ENCOUNTER — Encounter: Payer: Self-pay | Admitting: *Deleted

## 2017-12-08 ENCOUNTER — Encounter: Payer: Medicare Other | Admitting: Family Medicine

## 2017-12-08 NOTE — Patient Outreach (Signed)
[facility]) Care Management  [place]  12/08/2017   Luke [name] 10-22-1945 [id number]   RN Health Coach Initial Assessment   Referral Date:  11/14/2017 Referral Source:  Hospital Liaison Reason for Referral:  Disease Management Education Insurance:  Medicare   Outreach Attempt:  Successful telephone outreach to patient for initial telephone assessment.  HIPAA verified with patient.  Patient completed initial telephone assessment.  Social:  Patient lives at home with wife.  Ambulates independently.  Independent with ADLs and IADLs.  Reports one mechanical fall 3 months ago, tripping over slate while carrying a big pile of brush; denies any injuries.  Transports self to medical appointments.  DME in the home include:  CBG meter, scale, eyeglasses, blood pressure cuff, and upper and lower dentures.  Conditions:   Per chart review and discussion with patient, PMH include but not limited to:  Diabetes, essential hypertension, coronary artery disease, hyperlipidemia, chronic kidney disease, ischemic cardiomyopathy, anxiety, atrial fibrillation with automatic implantable cardioverter defibrillator /pacemaker, peripheral artery disease, abdominal aortic aneurysm repair, hernia repair, and cervical fusion.  Patient recently discharged from the hospital with a pancreatitis exacerbation.  Denies any pain right now, but does state his blood sugars where elevated (300's) post discharge. States over the past week, his blood sugars have trend down.  Monitors blood sugars twice a day.  Patient reporting morning fasting blood sugars tend to be more elevated than his night time blood sugars.  This mornings blood sugar was 131, with ranges of 100-160's.  Latest Hgb AC is 7.4 from 11/12/2017.  Weighs himself every other day.  Weight this morning was 178 pounds.  Discussed with patient the importance of weighing daily.  Patient reports monitoring his blood pressure about 3 times a week  ranging 120/80's.  Reports he stepped on some sea shells at the [facility] in August and his foot has a callous and sore on it.  Patient stating he is making an appointment with the Podiatrist.  Medications:   Patient reports taking about 6 different medications.  Manages his medications himself without difficulties, and takes from pill bottles.  Denies any issues with affording medications at this time.  Encounter Medications:  Outpatient Encounter Medications as of 12/08/2017  Medication Sig  . amLODipine (NORVASC) 10 MG tablet Take 1 tablet (10 mg total) by mouth daily.  Marland Kitchen aspirin EC 81 MG tablet Take 81 mg by mouth daily.  . carvedilol (COREG) 25 MG tablet TAKE 1 TABLET BY MOUTH TWICE A DAY WITH MEALS  . Cholecalciferol (VITAMIN D PO) Take 1,200 Int'l Units by mouth daily.  Marland Kitchen glipiZIDE (GLUCOTROL) 5 MG tablet TAKE 2 TABLETS(10 MG) BY MOUTH TWICE DAILY BEFORE A MEAL  . glucose blood (ACCU-CHEK AVIVA PLUS) test strip CHECK BLOOD SUGAR TWICE A DAY AND AS DIRECTED. DX. E11.59  . hydrALAZINE (APRESOLINE) 25 MG tablet Take 1 tablet (25 mg total) by mouth 3 (three) times daily.  . Insulin Glargine (LANTUS SOLOSTAR) 100 UNIT/ML Solostar Pen Inject 20 Units into the skin daily at 10 pm. (Patient taking differently: Inject 25 Units into the skin daily at 10 pm. )  . Insulin Pen Needle (PENTIPS) 31G X 5 MM MISC Use daily with insulin pen  . metFORMIN (GLUCOPHAGE) 500 MG tablet TAKE 2 TABLETS BY MOUTH TWICE DAILY WITH FOOD  . Multiple Vitamin (MULTIVITAMIN) tablet Take 1 tablet by mouth daily.   Marland Kitchen omeprazole (PRILOSEC) 20 MG capsule Take 1 capsule (20 mg total) by mouth daily.  Marland Kitchen  sildenafil (REVATIO) 20 MG tablet TAKE THREE TO FOUR TABLETS BY MOUTH DAILY AS NEEDED FOR ED  . simvastatin (ZOCOR) 40 MG tablet Take 1 tablet (40 mg total) by mouth at bedtime.  . vitamin B-12 (CYANOCOBALAMIN) 500 MCG tablet Take 500 mcg by mouth daily.   No facility-administered encounter medications on file as of 12/08/2017.      Functional Status:  In your present state of health, do you have any difficulty performing the following activities: 12/08/2017 11/11/2017  Hearing? N N  Vision? N N  Difficulty concentrating or making decisions? N N  Walking or climbing stairs? N N  Dressing or bathing? N N  Doing errands, shopping? N N  Preparing Food and eating ? N -  Using the Toilet? N -  In the past six months, have you accidently leaked urine? N -  Do you have problems with loss of bowel control? N -  Managing your Medications? N -  Managing your Finances? N -  Housekeeping or managing your Housekeeping? N -  Some recent data might be hidden    Fall/Depression Screening: Fall Risk  12/08/2017 11/19/2016 11/14/2015  Falls in the past year? Yes No No  Number falls in past yr: 1 - -  Injury with Fall? No - -  Risk for fall due to : Medication side effect - -  Follow up Falls prevention discussed;Education provided - -   PHQ 2/9 Scores 12/08/2017 11/19/2016 11/14/2015 10/18/2014 03/18/2013  PHQ - 2 Score 0 0 0 0 0    THN CM Care Plan Problem One     Most Recent Value  Care Plan Problem One  Knowledge deficiet related to self care management of diabetic complications.  Role Documenting the Problem One  [place] for Problem One  Active  [facility] Long Term Goal   Patient will report decrease in Hgb A1C by 0.2 points in the next 90 days.  THN Long Term Goal Start Date  12/08/17  Interventions for Problem One Long Term Goal  Reviewed and discussed current care plan and goals, encouraged to keep and attend medical appointments, reviewed medications and encouraged medication compliance, discussed goal Hgb A1C, congratulated patient on daily aerobic exercise and encouraged continued exercise routine, discussed pancreatitis and resent increase and blood sugars (trending down now)  THN CM Short Term Goal #1   Patient will reschedule eye appointment in the next 90 days.  THN CM Short Term Goal #1 Start Date   12/08/17  Interventions for Short Term Goal #1  Discussed importance of routine eye exams, encouraged patient to rechedule missed appointment due to being hospitalized, discussed routine exams to check for retinopathy  THN CM Short Term Goal #2   Patient will schedule podiatrist appointment in the next 90 days.  THN CM Short Term Goal #2 Start Date  12/08/17  Interventions for Short Term Goal #2  Discussed with patient importance of monitoring feet daily, encouraged patient to request primary care physician to monitor feet at appointments, discussed with patient need for foot exam with continued soreness from stepping on sea shells in April,  encouraged patient to schedule Podiatrist appointment as soon as possible      Appointments:  Patient attended medical appointment with Dr. Damita Dunnings on 11/18/2017 and has scheduled follow up appointment on 02/05/2018.  States he will reschedule eye appointment and schedule podiatrist appointment.  Advanced Directives:  States he has a Living Will in place and does not wish to make changes  to it at this time.   Consent:  [facility] services reviewed and discussed with patient.  Patient verbally agrees to quarterly telephone outreaches.  Plan:  RN Health Coach will send primary MD barriers letter. RN Health Coach will route initial telephone assessment note to primary MD. [place] will send patient [place]. RN Health Coach will send patient 2019 Calendar Booklet. RN Health Coach will make next monthly outreach to patient in the month of November.  [place] [phone] [name]

## 2017-12-11 DIAGNOSIS — M79671 Pain in right foot: Secondary | ICD-10-CM | POA: Diagnosis not present

## 2017-12-11 DIAGNOSIS — L97512 Non-pressure chronic ulcer of other part of right foot with fat layer exposed: Secondary | ICD-10-CM | POA: Diagnosis not present

## 2017-12-11 DIAGNOSIS — M21611 Bunion of right foot: Secondary | ICD-10-CM | POA: Diagnosis not present

## 2017-12-15 ENCOUNTER — Encounter: Payer: Medicare Other | Admitting: Family Medicine

## 2017-12-19 DIAGNOSIS — H35033 Hypertensive retinopathy, bilateral: Secondary | ICD-10-CM | POA: Diagnosis not present

## 2017-12-19 DIAGNOSIS — E113291 Type 2 diabetes mellitus with mild nonproliferative diabetic retinopathy without macular edema, right eye: Secondary | ICD-10-CM | POA: Diagnosis not present

## 2017-12-19 LAB — HM DIABETES EYE EXAM

## 2017-12-23 LAB — HM DIABETES EYE EXAM

## 2017-12-25 DIAGNOSIS — M71571 Other bursitis, not elsewhere classified, right ankle and foot: Secondary | ICD-10-CM | POA: Diagnosis not present

## 2017-12-29 ENCOUNTER — Telehealth: Payer: Self-pay | Admitting: Family Medicine

## 2017-12-29 DIAGNOSIS — R1310 Dysphagia, unspecified: Secondary | ICD-10-CM

## 2017-12-29 NOTE — Telephone Encounter (Signed)
Copied from [place] [id number]. Topic: Referral - Request >> Dec 29, 2017  1:43 PM Reyne Dumas L wrote: Reason for CRM:   Pt calling to say that his swallowing issues are getting worse and he would like a referral to [facility]. Pt can be reached at [phone]

## 2017-12-30 ENCOUNTER — Encounter: Payer: Self-pay | Admitting: Nurse Practitioner

## 2017-12-30 NOTE — Telephone Encounter (Signed)
I put in the referral.  Thanks.  

## 2017-12-30 NOTE — Telephone Encounter (Signed)
Patient advised.

## 2017-12-31 ENCOUNTER — Other Ambulatory Visit: Payer: Self-pay | Admitting: Cardiology

## 2018-01-06 ENCOUNTER — Encounter: Payer: Self-pay | Admitting: Optometrist

## 2018-01-06 ENCOUNTER — Encounter: Payer: Medicare Other | Admitting: *Deleted

## 2018-01-06 ENCOUNTER — Telehealth: Payer: Self-pay | Admitting: Cardiology

## 2018-01-06 NOTE — Telephone Encounter (Signed)
LMOVM reminding pt to send remote transmission.   

## 2018-01-07 ENCOUNTER — Other Ambulatory Visit: Payer: Self-pay | Admitting: Family Medicine

## 2018-01-09 ENCOUNTER — Encounter: Payer: Self-pay | Admitting: Cardiology

## 2018-01-12 ENCOUNTER — Telehealth: Payer: Self-pay | Admitting: Family Medicine

## 2018-01-12 NOTE — Telephone Encounter (Signed)
Copied from [place] [phone]. Topic: Inquiry >> Jan 12, 2018  2:05 PM Neva Seat wrote: [facility] is needing a referral form from Dr. Damita Dunnings for pt to be seen on Sept 12 at 10 am.  Fax [phone] Phone  [phone]

## 2018-01-12 NOTE — Telephone Encounter (Signed)
[facility] office. Referral was done through Epic and their office has all the information. They were not sure who called to request the referral but to disregard this message. No further action is needed on our [employer], RMA

## 2018-01-12 NOTE — Telephone Encounter (Signed)
appt already scheduled with GI for dysphagia. Pt last seen 11/18/17.

## 2018-01-12 NOTE — Telephone Encounter (Signed)
Wrong Office, sending to right office.

## 2018-01-12 NOTE — Telephone Encounter (Signed)
Ordered prev on 12/30/17.  Does order need to be redone?  If so, then let me know.  Thanks.

## 2018-01-12 NOTE — Telephone Encounter (Signed)
Copied from [place] #[id number]. >> Jan 12, 2018  2:05 PM Neva Seat wrote: [facility] is needing a referral form from Dr. Damita Dunnings for pt to be seen on Sept 12 at 10 am.  Fax [phone] Phone  [phone]

## 2018-01-12 NOTE — Telephone Encounter (Signed)
Per chart, referral requested 8/27

## 2018-01-13 ENCOUNTER — Ambulatory Visit (INDEPENDENT_AMBULATORY_CARE_PROVIDER_SITE_OTHER): Payer: Medicare Other | Admitting: *Deleted

## 2018-01-13 DIAGNOSIS — I5022 Chronic systolic (congestive) heart failure: Secondary | ICD-10-CM | POA: Diagnosis not present

## 2018-01-13 DIAGNOSIS — I255 Ischemic cardiomyopathy: Secondary | ICD-10-CM

## 2018-01-13 NOTE — Telephone Encounter (Signed)
Please see other message in the chart. I called [facility] and asked and was told to disregard this message, they are not sure who called about this but no further action is needed from [place]. Order already linked for his appt from the referral in August. [place], RMA

## 2018-01-14 NOTE — Progress Notes (Signed)
Remote ICD transmission.   

## 2018-01-15 ENCOUNTER — Encounter

## 2018-01-15 ENCOUNTER — Ambulatory Visit (INDEPENDENT_AMBULATORY_CARE_PROVIDER_SITE_OTHER): Payer: Medicare Other | Admitting: Nurse Practitioner

## 2018-01-15 ENCOUNTER — Encounter: Payer: Self-pay | Admitting: Nurse Practitioner

## 2018-01-15 VITALS — BP 148/70 | HR 62 | Ht 74.0 in | Wt 182.0 lb

## 2018-01-15 DIAGNOSIS — Z1211 Encounter for screening for malignant neoplasm of colon: Secondary | ICD-10-CM | POA: Diagnosis not present

## 2018-01-15 DIAGNOSIS — R1310 Dysphagia, unspecified: Secondary | ICD-10-CM | POA: Diagnosis not present

## 2018-01-15 DIAGNOSIS — K859 Acute pancreatitis without necrosis or infection, unspecified: Secondary | ICD-10-CM | POA: Diagnosis not present

## 2018-01-15 MED ORDER — NA SULFATE-K SULFATE-MG SULF 17.5-3.13-1.6 GM/177ML PO SOLN: ORAL | 0 refills | Status: DC

## 2018-01-15 MED ORDER — FLUCONAZOLE 100 MG PO TABS: ORAL_TABLET | ORAL | 0 refills | Status: DC

## 2018-01-15 NOTE — Patient Instructions (Addendum)
If you are age 71 or older, your body mass index should be between 23-30. Your Body mass index is 23.37 kg/m. If this is out of the aforementioned range listed, please consider follow up with your Primary Care Provider.  If you are age 29 or younger, your body mass index should be between 19-25. Your Body mass index is 23.37 kg/m. If this is out of the aformentioned range listed, please consider follow up with your Primary Care Provider.   You have been scheduled for an endoscopy. Please follow written instructions given to you at your visit today. If you use inhalers (even only as needed), please bring them with you on the day of your procedure. Your physician has requested that you go to www.startemmi.com and enter the access code given to you at your visit today. This web site gives a general overview about your procedure. However, you should still follow specific instructions given to you by our office regarding your preparation for the procedure.   You have been scheduled for a colonoscopy. Please follow written instructions given to you at your visit today.  Please pick up your prep supplies at the pharmacy within the next 1-3 days. If you use inhalers (even only as needed), please bring them with you on the day of your procedure. Your physician has requested that you go to www.startemmi.com and enter the access code given to you at your visit today. This web site gives a general overview about your procedure. However, you should still follow specific instructions given to you by our office regarding your preparation for the procedure.   We have sent the following medications to your pharmacy for you to pick up at your convenience: Suprep Diflucan  Continue Omeprazole daily.  Thank you for choosing me and [facility].   Tye Savoy, NP

## 2018-01-15 NOTE — Progress Notes (Signed)
GI provider:   New patient              Chief Complaint: Swallowing problems  Referring Provider:  Tonia Ghent, MD   ASSESSMENT AND PLAN;   23.  72 year old male with solid food dysphagia following hospitalization for pancreatitis during which time he received antibiotics.  Candida esophagitis is certainly possibility.  I did not see any evidence for Candida on oral exam.  He has no odynophagia, no weight loss.  -I am going to schedule him for an EGD in a couple of weeks.  In the interim we will treat empirically with Diflucan.  If dysphagia resolves then patient may call to cancel the EGD. The risks and benefits of EGD were discussed and the patient agrees to proceed.    2.  Colon cancer screening.  Last colonoscopy was 3 years ago with [facility], prep was poor and stool obscured several views of the colon.  He was advised have repeat colonoscopy at 3 years but did not do so.  He has no lower GI complaints, rectal bleeding or other alarm features. - will schedule patient for screening colonoscopy to be done sometime in the next several weeks.  There is no urgency.  Need to work-up #1 right now. The risks and benefits of colonoscopy with possible polypectomy were discussed and the patient agrees to proceed.   3. Pancreatitis.  Etiology of acute pancreatitis in July was undetermined.  Did not seem to be biliary.  No significant alcohol use.  ACEI possible culprit. Pain has resolved.   -We were not involved with evaluation and treatment of the pancreatitis so will defer to PCP but consider follow-up CT scan in a month or so. Pancreatic inflammation on last CT scan may have obscured underlying pancreatic lesion.    HPI:    Patient is a 72 year old male with a multiple medical problems not limited to  DM2, PVD , hypertension, CAD,  cardiomyopathy with BBB, AFIB, pacemaker.  Echo in 2019 shows normal EF.  Patient had pancreatitis early July.  There was question of whether pancreatitis  could have been secondary to lisinopril.  His triglycerides were normal.  No significant alcohol intake.  No biliary duct dilation on imaging.  No evidence for cholelithiasis and liver test were essentially normal.  Patient had a screening colonoscopy with [facility] April 2011.  The exam was complete butquality of the bowel prep was only fair with stool obscuring several views of the colon.  There were multiple medium mouth diverticula in the sigmoid.   No large lesions were seen,  repeat colonoscopy recommended at 3 years but patient did not get it done.   Patient is here for evaluation of solid food dysphagia which started almost immediately upon hospital discharge.  He is having difficulty swallowing solids and let us.  He has had to regurgitate food a few times.  No odynophagia.  He has not noticed any white plaques in his mouth suggesting yeast but did have antibiotics in the hospital in July.  No associated weight loss.  He has never had swallowing problems before.  No history of GERD but PCP started him empirically on a PPI, he has not noticed any improvement in the swallowing.   Patient has no other GI or general medical complaints.  Past Medical History:  Diagnosis Date  . AAA (abdominal aortic aneurysm) ([place]) 2011   Per vascular surgery  . Anxiety    no med. in use for  anxiety but pt. speaks openly of his stress & anxious feelings regarding impending surgery    . Atrial fibrillation ([place])   . Automatic implantable cardioverter-defibrillator in situ   . CAD (coronary artery disease)    Presumed CAD with nuclear scan October 09, 2011,  large anteroseptal MI and inferior MI. Catheterization scheduled October 15, 2011  . Cardiomyopathy [facility])    Nuclear, October 09, 2011, EF 30%, multiple focal wall motion abnormalities  . Diabetes mellitus    type II  . Drug therapy    Hyperkalemia with spironolactone  . Ejection fraction < 50%    EF 30%, nuclear, October 09, 2011  . Ejection fraction < 50%    EF  previously 30%  //   EF 30-35%, echo, July 14, 2012, severe diffuse hypokinesis, PA pressure 43 mm mercury  . Fall due to ice or snow Feb.  19, 2015  . HLD (hyperlipidemia)   . Hypertension    white coat HTN-- often elevated in office and controlled on outside checks.  . ICD (implantable cardioverter-defibrillator) in place    CRT-D placed March, 2014 complete heart block and k dysfunction  . LBBB (left bundle branch block)    LBBB on EKG October 11, 2011,  no prior EKG has been done  . Low testosterone    Hx of  . Myocardial infarction ([place])   . Pacemaker   . PAD (peripheral artery disease) ([place])      Past Surgical History:  Procedure Laterality Date  . ABDOMINAL AORTAGRAM N/A 10/28/2011   Procedure: ABDOMINAL [name];  Surgeon: Angelia Mould, MD;  Location: [facility] CATH LAB;  Service: Cardiovascular;  Laterality: N/A;  . ABDOMINAL AORTIC ANEURYSM REPAIR     EVAR   . BI-VENTRICULAR IMPLANTABLE CARDIOVERTER DEFIBRILLATOR N/A 07/16/2012   Procedure: BI-VENTRICULAR IMPLANTABLE CARDIOVERTER DEFIBRILLATOR  (CRT-D);  Surgeon: Deboraha Sprang, MD;  Location: [facility] CATH LAB;  Service: Cardiovascular;  Laterality: N/A;  . CARDIAC CATHETERIZATION    . COLONOSCOPY    . HERNIA REPAIR  Jan. 9, 2015  . INSERTION OF MESH N/A 05/14/2013   Procedure: INSERTION OF MESH;  Surgeon: Adin Hector, MD;  Location: [facility];  Service: General;  Laterality: N/A;  . LOWER EXTREMITY ANGIOGRAM Bilateral 10/28/2011   Procedure: LOWER EXTREMITY ANGIOGRAM;  Surgeon: Angelia Mould, MD;  Location: [facility] CATH LAB;  Service: Cardiovascular;  Laterality: Bilateral;  . PACEMAKER INSERTION  07-16-12   pacemaker/defibrilator  . POSTERIOR CERVICAL FUSION/FORAMINOTOMY N/A 06/09/2014   Procedure: Laminectomy - Cervical two-Cervcial four posterior cervical instrumented fusion Cervical two-cervical four;  Surgeon: Eustace Moore, MD;  Location: [facility] NEURO ORS;  Service: Neurosurgery;  Laterality: N/A;  posterior   . TONSILLECTOMY      as a child   . UMBILICAL HERNIA REPAIR N/A 05/14/2013   Procedure: LAPAROSCOPIC exploration and repair of hernia in abdominal ;  Surgeon: Adin Hector, MD;  Location: [facility] OR;  Service: General;  Laterality: N/A;   Family History  Problem Relation Age of Onset  . Hypertension Mother   . Stroke Mother   . Hyperlipidemia Mother   . Diabetes Sister   . Heart disease Sister        Before age 32  . Hypertension Sister   . Hyperlipidemia Sister   . Heart attack Sister   . Cancer Father        Lung  . Hypertension Son   . Colon cancer Neg Hx   . Prostate cancer Neg Hx  Social History   Tobacco Use  . Smoking status: Former Smoker    Packs/day: 2.00    Years: 40.00    Pack years: 80.00    Types: Cigarettes    Last attempt to quit: 05/06/2004    Years since quitting: 13.7  . Smokeless tobacco: Never Used  Substance Use Topics  . Alcohol use: Yes    Alcohol/week: 0.0 - 2.0 standard drinks    Comment: beer or wine  occassionally  . Drug use: No   Current Outpatient Medications  Medication Sig Dispense Refill  . amLODipine (NORVASC) 10 MG tablet Take 1 tablet (10 mg total) by mouth daily. 30 tablet 3  . aspirin EC 81 MG tablet Take 81 mg by mouth daily.    . carvedilol (COREG) 25 MG tablet TAKE 1 TABLET BY MOUTH TWICE DAILY WITH MEALS 180 tablet 2  . Cholecalciferol (VITAMIN D PO) Take 1,200 Int'l Units by mouth daily.    Marland Kitchen glipiZIDE (GLUCOTROL) 5 MG tablet TAKE 2 TABLETS(10 MG) BY MOUTH TWICE DAILY BEFORE A MEAL 360 tablet 0  . glucose blood (ACCU-CHEK AVIVA PLUS) test strip CHECK BLOOD SUGAR TWICE A DAY AND AS DIRECTED. DX. E11.59 100 each 5  . hydrALAZINE (APRESOLINE) 25 MG tablet Take 1 tablet (25 mg total) by mouth 3 (three) times daily. 90 tablet 2  . Insulin Glargine (LANTUS SOLOSTAR) 100 UNIT/ML Solostar Pen Inject 20 Units into the skin daily at 10 pm. (Patient taking differently: Inject 25 Units into the skin daily at 10 pm. )    . Insulin Pen Needle (PENTIPS) 31G X 5  MM MISC Use daily with insulin pen 100 each 3  . metFORMIN (GLUCOPHAGE) 500 MG tablet TAKE 2 TABLETS BY MOUTH TWICE DAILY WITH FOOD 360 tablet 0  . Multiple Vitamin (MULTIVITAMIN) tablet Take 1 tablet by mouth daily.     Marland Kitchen omeprazole (PRILOSEC) 20 MG capsule Take 1 capsule (20 mg total) by mouth daily.    . sildenafil (REVATIO) 20 MG tablet TAKE THREE TO FOUR TABLETS BY MOUTH DAILY AS NEEDED FOR ED    . simvastatin (ZOCOR) 40 MG tablet TAKE 1 TABLET(40 MG) BY MOUTH AT BEDTIME 90 tablet 3  . vitamin B-12 (CYANOCOBALAMIN) 500 MCG tablet Take 500 mcg by mouth daily.     No current facility-administered medications for this visit.    Allergies  Allergen Reactions  . Aldactone [Spironolactone] Other (See Comments)    Hyperkalemia  . Codeine Rash and Hives  . Januvia [Sitagliptin] Other (See Comments)    Diarrhea and heart racing  . Lisinopril     Possible cause of pancreatitis     Review of Systems: All systems reviewed and negative except where noted in HPI.   Creatinine clearance cannot be calculated (Patient's most recent lab result is older than the maximum 21 days allowed.)   Physical Exam:    Wt Readings from Last 3 Encounters:  01/15/18 182 lb (82.6 kg)  12/03/17 180 lb (81.6 kg)  11/18/17 181 lb 4 oz (82.2 kg)    BP (!) 148/70   Pulse 62   Ht 6\' 2"  (1.88 m)   Wt 182 lb (82.6 kg)   BMI 23.37 kg/m   Constitutional:  Pleasant male in no acute distress. Psychiatric: Normal mood and affect. Behavior is normal. EENT: Pupils normal.  Conjunctivae are normal. No scleral icterus. Neck supple.  Cardiovascular: Normal rate, regular rhythm. No edema Pulmonary/chest: Effort normal and breath sounds normal. No wheezing, rales or rhonchi.  Abdominal: Soft, nondistended, nontender. Bowel sounds active throughout. There are no masses palpable. No hepatomegaly. Neurological: Alert and oriented to person place and time. Skin: Skin is warm and dry. No rashes noted.  Tye Savoy,  NP  01/15/2018, 10:29 AM  Cc:  Tonia Ghent, MD

## 2018-01-19 NOTE — Progress Notes (Signed)
Will need follow up imaging of pancreas (CT abdomen pancreas protocol ) to exclude any neoplastic lesion in 6-8 weeks after resolution of acute pancreatitis.  Reviewed and agree with documentation and assessment and plan. Damaris Hippo , MD

## 2018-01-20 NOTE — Progress Notes (Signed)
Spoke with Luke [name]. She is advised of this plan and will expect a call from this office in 6 weeks.

## 2018-01-26 ENCOUNTER — Telehealth: Payer: Self-pay | Admitting: Gastroenterology

## 2018-01-26 NOTE — Telephone Encounter (Signed)
Left a message to call 

## 2018-01-26 NOTE — Telephone Encounter (Signed)
The patient reports he is more than 50% better after the course of Diflucan. He is considering the EGD. Discussed options. He will call tomorrow and let us know if he wants to wait.

## 2018-01-26 NOTE — Telephone Encounter (Signed)
Patient returned phone call. °

## 2018-01-27 NOTE — Telephone Encounter (Signed)
Pt returning your call, please call before 10:00am

## 2018-01-27 NOTE — Telephone Encounter (Signed)
Has a pacer maker with defibrillator and had aortic aneurism repair in the past. He wanted to be certain this was known.  EF 55%-60% 07/16/2017.

## 2018-01-29 ENCOUNTER — Other Ambulatory Visit: Payer: Self-pay

## 2018-01-29 ENCOUNTER — Encounter: Payer: Self-pay | Admitting: Gastroenterology

## 2018-01-29 ENCOUNTER — Ambulatory Visit (AMBULATORY_SURGERY_CENTER): Payer: Medicare Other | Admitting: Gastroenterology

## 2018-01-29 ENCOUNTER — Telehealth: Payer: Self-pay | Admitting: *Deleted

## 2018-01-29 VITALS — BP 136/76 | HR 60 | Temp 97.8°F | Resp 11 | Ht 74.0 in | Wt 182.0 lb

## 2018-01-29 DIAGNOSIS — R1310 Dysphagia, unspecified: Secondary | ICD-10-CM

## 2018-01-29 DIAGNOSIS — I2510 Atherosclerotic heart disease of native coronary artery without angina pectoris: Secondary | ICD-10-CM | POA: Diagnosis not present

## 2018-01-29 DIAGNOSIS — K222 Esophageal obstruction: Secondary | ICD-10-CM

## 2018-01-29 DIAGNOSIS — I429 Cardiomyopathy, unspecified: Secondary | ICD-10-CM | POA: Diagnosis not present

## 2018-01-29 DIAGNOSIS — K228 Other specified diseases of esophagus: Secondary | ICD-10-CM | POA: Diagnosis not present

## 2018-01-29 MED ORDER — SODIUM CHLORIDE 0.9 % IV SOLN: 500.0000 mL | Freq: Once | INTRAVENOUS | Status: DC

## 2018-01-29 MED ORDER — OMEPRAZOLE 40 MG PO CPDR: 40.0000 mg | DELAYED_RELEASE_CAPSULE | Freq: Two times a day (BID) | ORAL | 3 refills | Status: DC

## 2018-01-29 NOTE — Progress Notes (Signed)
Report given to PACU, vss 

## 2018-01-29 NOTE — Progress Notes (Signed)
Called to room to assist during endoscopic procedure.  Patient ID and intended procedure confirmed with present staff. Received instructions for my participation in the procedure from the performing physician.  

## 2018-01-29 NOTE — Telephone Encounter (Signed)
EGD scheduled for 02-05-18 at [facility].  Amb gi referral put in.  Instructions for EGD given to pt and carepartner

## 2018-01-29 NOTE — Patient Instructions (Addendum)
Thank you for allowing [place] to care for you today!  Follow soft, mechanical diet.  Chew well and slowly,  Refrain from eating one hour before going to bed.  Follow anti-reflux regimenm  No high dose Aspirin, Ibuprofen, or Naproxen or other NSAIDS.  Use Prilosec (Omeprazole) 40 mg by mouth twice a day before meals.  Sent to your pharmacy.   EGD at [facility] Thursday February 05, 2018.    Be there at 8:30 AM for a 10AM procedure.   See prep instructions on separate handout.   YOU HAD AN ENDOSCOPIC PROCEDURE TODAY AT [facility]:   Refer to the procedure report that was given to you for any specific questions about what was found during the examination.  If the procedure report does not answer your questions, please call your gastroenterologist to clarify.  If you requested that your care partner not be given the details of your procedure findings, then the procedure report has been included in a sealed envelope for you to review at your convenience later.  YOU SHOULD EXPECT: Some feelings of bloating in the abdomen. Passage of more gas than usual.  Walking can help get rid of the air that was put into your GI tract during the procedure and reduce the bloating. If you had a lower endoscopy (such as a colonoscopy or flexible sigmoidoscopy) you may notice spotting of blood in your stool or on the toilet paper. If you underwent a bowel prep for your procedure, you may not have a normal bowel movement for a few days.  Please Note:  You might notice some irritation and congestion in your nose or some drainage.  This is from the oxygen used during your procedure.  There is no need for concern and it should clear up in a day or so.  SYMPTOMS TO REPORT IMMEDIATELY:    Following upper endoscopy (EGD)  Vomiting of blood or coffee ground material  New chest pain or pain under the shoulder blades  Painful or persistently difficult swallowing  New shortness of breath  Fever  of 100F or higher  Black, tarry-looking stools  For urgent or emergent issues, a gastroenterologist can be reached at any hour by calling [phone].   DIET:  We do recommend a small meal at first, but then you may proceed to your regular diet.  Drink plenty of fluids but you should avoid alcoholic beverages for 24 hours.  ACTIVITY:  You should plan to take it easy for the rest of today and you should NOT DRIVE or use heavy machinery until tomorrow (because of the sedation medicines used during the test).    FOLLOW UP: Our staff will call the number listed on your records the next business day following your procedure to check on you and address any questions or concerns that you may have regarding the information given to you following your procedure. If we do not reach you, we will leave a message.  However, if you are feeling well and you are not experiencing any problems, there is no need to return our call.  We will assume that you have returned to your regular daily activities without incident.  If any biopsies were taken you will be contacted by phone or by letter within the next 1-3 weeks.  Please call us at [phone] if you have not heard about the biopsies in 3 weeks.    SIGNATURES/CONFIDENTIALITY: You and/or your care partner have signed paperwork which will  be entered into your electronic medical record.  These signatures attest to the fact that that the information above on your After Visit Summary has been reviewed and is understood.  Full responsibility of the confidentiality of this discharge information lies with you and/or your care-partner. 

## 2018-01-29 NOTE — Op Note (Addendum)
[facility] Patient Name: Luke [name] Procedure Date: 01/29/2018 9:35 AM MRN: [id number] Endoscopist: Mauri Pole , MD Age: 72 Referring MD:  Date of Birth: [birth year] Gender: Male Account #: [id number] Procedure:                Upper GI endoscopy Indications:              Dysphagia Medicines:                Monitored Anesthesia Care Procedure:                Pre-Anesthesia Assessment:                           - Prior to the procedure, a History and Physical                            was performed, and patient medications and                            allergies were reviewed. The patient's tolerance of                            previous anesthesia was also reviewed. The risks                            and benefits of the procedure and the sedation                            options and risks were discussed with the patient.                            All questions were answered, and informed consent                            was obtained. Prior Anticoagulants: The patient has                            taken no previous anticoagulant or antiplatelet                            agents. ASA Grade Assessment: III - A patient with                            severe systemic disease. After reviewing the risks                            and benefits, the patient was deemed in                            satisfactory condition to undergo the procedure.                           After obtaining informed consent, the endoscope was  passed under direct vision. Throughout the                            procedure, the patient's blood pressure, pulse, and                            oxygen saturations were monitored continuously. The                            Endoscope was introduced through the mouth, and                            advanced to the middle third of esophagus. The                            upper GI endoscopy was performed with  difficulty                            due to narrowing. The patient tolerated the                            procedure well. Scope In: Scope Out: Findings:                 One benign-appearing, intrinsic severe (stenosis;                            an endoscope cannot pass) stenosis was found 28 to                            29 cm from the incisors. This stenosis measured 8                            mm (inner diameter) x 1 cm (in length). The                            stenosis was not traversed. Biopsies were obtained                            from the proximal esophagus with cold forceps for                            histology of suspected eosinophilic esophagitis. Complications:            No immediate complications. Estimated Blood Loss:     Estimated blood loss was minimal. Impression:               - Benign-appearing esophageal stenosis. Biopsied. Recommendation:           - Patient has a contact number available for                            emergencies. The signs and symptoms of potential  delayed complications were discussed with the                            patient. Return to normal activities tomorrow.                            Written discharge instructions were provided to the                            patient.                           - Mechanical soft diet.                           - Continue present medications.                           - Await pathology results.                           - No high dose aspirin, ibuprofen, naproxen, or                            other non-steroidal anti-inflammatory drugs.                           - Use Prilosec (omeprazole) 40 mg PO BID.                           - Follow an antireflux regimen.                           - Schedule EGD a [facility] endoscopy unit next week for                            dilation Mauri Pole, MD 01/29/2018 9:56:16 AM This report has been signed electronically.

## 2018-01-30 ENCOUNTER — Telehealth: Payer: Self-pay | Admitting: *Deleted

## 2018-01-30 NOTE — Telephone Encounter (Signed)
  Follow up Call-  Call back number 01/29/2018  Post procedure Call Back phone  # [phone]  Permission to leave phone message Yes  Some recent data might be hidden     Patient questions:  Do you have a fever, pain , or abdominal swelling? No. Pain Score  0 *  Have you tolerated food without any problems? Yes.    Have you been able to return to your normal activities? Yes.    Do you have any questions about your discharge instructions: Diet   No. Medications  No. Follow up visit  No.  Do you have questions or concerns about your Care? No.  Actions: * If pain score is 4 or above: No action needed, pain <4.

## 2018-02-03 ENCOUNTER — Other Ambulatory Visit: Payer: Self-pay | Admitting: Family Medicine

## 2018-02-03 DIAGNOSIS — E1159 Type 2 diabetes mellitus with other circulatory complications: Secondary | ICD-10-CM

## 2018-02-03 NOTE — Progress Notes (Signed)
Device orders for procedure call for device programming before and after procedure.  Called [employer] as listed on device orders to find out the [employer] is [employer].  [employer] device representative paged.  Mickel Baas from [facility] called back.  Informed her of patient and date of birth and what orders stated.  Mickel Baas stated she would be here between 0900am and 0930am day of procedure.

## 2018-02-04 ENCOUNTER — Encounter (HOSPITAL_COMMUNITY): Payer: Self-pay | Admitting: *Deleted

## 2018-02-04 ENCOUNTER — Other Ambulatory Visit: Payer: Self-pay

## 2018-02-04 LAB — CUP PACEART REMOTE DEVICE CHECK
Battery Remaining Longevity: 29 mo
Battery Remaining Percentage: 32 %
Battery Voltage: 2.89 V
Brady Statistic AP VP Percent: 16 %
Brady Statistic AP VS Percent: 1 %
Brady Statistic AS VP Percent: 84 %
Brady Statistic AS VS Percent: 1 %
Brady Statistic RA Percent Paced: 16 %
Date Time Interrogation Session: 20190910202248
HighPow Impedance: 82 Ohm
HighPow Impedance: 82 Ohm
Implantable Lead Implant Date: 20140313
Implantable Lead Implant Date: 20140313
Implantable Lead Implant Date: 20140313
Implantable Lead Location: 753858
Implantable Lead Location: 753859
Implantable Lead Location: 753860
Implantable Pulse Generator Implant Date: 20140313
Lead Channel Impedance Value: 440 Ohm
Lead Channel Impedance Value: 450 Ohm
Lead Channel Impedance Value: 930 Ohm
Lead Channel Pacing Threshold Amplitude: 0.625 V
Lead Channel Pacing Threshold Amplitude: 0.75 V
Lead Channel Pacing Threshold Amplitude: 1 V
Lead Channel Pacing Threshold Pulse Width: 0.5 ms
Lead Channel Pacing Threshold Pulse Width: 0.5 ms
Lead Channel Pacing Threshold Pulse Width: 0.6 ms
Lead Channel Sensing Intrinsic Amplitude: 3.5 mV
Lead Channel Sensing Intrinsic Amplitude: 7.9 mV
Lead Channel Setting Pacing Amplitude: 2 V
Lead Channel Setting Pacing Amplitude: 2 V
Lead Channel Setting Pacing Amplitude: 2.5 V
Lead Channel Setting Pacing Pulse Width: 0.5 ms
Lead Channel Setting Pacing Pulse Width: 0.6 ms
Lead Channel Setting Sensing Sensitivity: 0.5 mV
Pulse Gen Serial Number: 7097007

## 2018-02-05 ENCOUNTER — Inpatient Hospital Stay: Payer: Medicare Other | Admitting: Family Medicine

## 2018-02-05 ENCOUNTER — Other Ambulatory Visit: Payer: Self-pay

## 2018-02-05 ENCOUNTER — Ambulatory Visit: Payer: Medicare Other

## 2018-02-05 ENCOUNTER — Encounter (HOSPITAL_COMMUNITY): Payer: Self-pay | Admitting: *Deleted

## 2018-02-05 ENCOUNTER — Ambulatory Visit (HOSPITAL_COMMUNITY)
Admission: RE | Admit: 2018-02-05 | Discharge: 2018-02-05 | Disposition: A | Discharge status: Home / Self Care | Payer: Medicare Other | Source: Ambulatory Visit | Attending: Gastroenterology | Admitting: Gastroenterology

## 2018-02-05 ENCOUNTER — Ambulatory Visit (HOSPITAL_COMMUNITY): Payer: Medicare Other | Admitting: Anesthesiology

## 2018-02-05 ENCOUNTER — Encounter (HOSPITAL_COMMUNITY)
Admission: RE | Disposition: A | Discharge status: Home / Self Care | Payer: Self-pay | Source: Ambulatory Visit | Attending: Gastroenterology

## 2018-02-05 DIAGNOSIS — K222 Esophageal obstruction: Secondary | ICD-10-CM | POA: Insufficient documentation

## 2018-02-05 DIAGNOSIS — Z794 Long term (current) use of insulin: Secondary | ICD-10-CM | POA: Insufficient documentation

## 2018-02-05 DIAGNOSIS — I10 Essential (primary) hypertension: Secondary | ICD-10-CM | POA: Insufficient documentation

## 2018-02-05 DIAGNOSIS — I447 Left bundle-branch block, unspecified: Secondary | ICD-10-CM | POA: Diagnosis not present

## 2018-02-05 DIAGNOSIS — R1310 Dysphagia, unspecified: Secondary | ICD-10-CM

## 2018-02-05 DIAGNOSIS — I4891 Unspecified atrial fibrillation: Secondary | ICD-10-CM | POA: Diagnosis not present

## 2018-02-05 DIAGNOSIS — I252 Old myocardial infarction: Secondary | ICD-10-CM | POA: Diagnosis not present

## 2018-02-05 DIAGNOSIS — I429 Cardiomyopathy, unspecified: Secondary | ICD-10-CM | POA: Insufficient documentation

## 2018-02-05 DIAGNOSIS — K2210 Ulcer of esophagus without bleeding: Secondary | ICD-10-CM | POA: Insufficient documentation

## 2018-02-05 DIAGNOSIS — E1151 Type 2 diabetes mellitus with diabetic peripheral angiopathy without gangrene: Secondary | ICD-10-CM | POA: Diagnosis not present

## 2018-02-05 DIAGNOSIS — Z87891 Personal history of nicotine dependence: Secondary | ICD-10-CM | POA: Diagnosis not present

## 2018-02-05 DIAGNOSIS — I2510 Atherosclerotic heart disease of native coronary artery without angina pectoris: Secondary | ICD-10-CM | POA: Insufficient documentation

## 2018-02-05 DIAGNOSIS — E785 Hyperlipidemia, unspecified: Secondary | ICD-10-CM | POA: Insufficient documentation

## 2018-02-05 DIAGNOSIS — T183XXA Foreign body in small intestine, initial encounter: Secondary | ICD-10-CM | POA: Insufficient documentation

## 2018-02-05 DIAGNOSIS — Z7982 Long term (current) use of aspirin: Secondary | ICD-10-CM | POA: Insufficient documentation

## 2018-02-05 DIAGNOSIS — Z95810 Presence of automatic (implantable) cardiac defibrillator: Secondary | ICD-10-CM | POA: Diagnosis not present

## 2018-02-05 DIAGNOSIS — F419 Anxiety disorder, unspecified: Secondary | ICD-10-CM | POA: Diagnosis not present

## 2018-02-05 HISTORY — PX: ESOPHAGOGASTRODUODENOSCOPY (EGD) WITH PROPOFOL: SHX5813

## 2018-02-05 HISTORY — PX: SAVORY DILATION: SHX5439

## 2018-02-05 LAB — GLUCOSE, CAPILLARY: Glucose-Capillary: 254 mg/dL — ABNORMAL HIGH (ref 70–99)

## 2018-02-05 SURGERY — ESOPHAGOGASTRODUODENOSCOPY (EGD) WITH PROPOFOL
Anesthesia: Monitor Anesthesia Care

## 2018-02-05 MED ORDER — PROPOFOL 10 MG/ML IV BOLUS
INTRAVENOUS | Status: DC | PRN
  Administered 2018-02-05: 10 mg via INTRAVENOUS
  Administered 2018-02-05: 20 mg via INTRAVENOUS

## 2018-02-05 MED ORDER — LIDOCAINE 2% (20 MG/ML) 5 ML SYRINGE
INTRAMUSCULAR | Status: DC | PRN
  Administered 2018-02-05: 100 mg via INTRAVENOUS

## 2018-02-05 MED ORDER — PROPOFOL 10 MG/ML IV BOLUS
INTRAVENOUS | Status: AC
  Filled 2018-02-05: qty 40

## 2018-02-05 MED ORDER — PROPOFOL 500 MG/50ML IV EMUL
INTRAVENOUS | Status: DC | PRN
  Administered 2018-02-05: 200 ug/kg/min via INTRAVENOUS

## 2018-02-05 MED ORDER — LACTATED RINGERS IV SOLN
INTRAVENOUS | Status: DC
  Administered 2018-02-05: 1000 mL via INTRAVENOUS

## 2018-02-05 MED ORDER — SODIUM CHLORIDE 0.9 % IV SOLN: INTRAVENOUS | Status: DC

## 2018-02-05 MED ORDER — SUCRALFATE 1 GM/10ML PO SUSP: 1.0000 g | Freq: Three times a day (TID) | ORAL | 1 refills | Status: DC

## 2018-02-05 MED ORDER — ONDANSETRON HCL 4 MG/2ML IJ SOLN
INTRAMUSCULAR | Status: DC | PRN
  Administered 2018-02-05: 4 mg via INTRAVENOUS

## 2018-02-05 SURGICAL SUPPLY — 15 items

## 2018-02-05 NOTE — Op Note (Addendum)
[facility] Patient Name: Luke [name] Procedure Date: 02/05/2018 MRN: [id number] Attending MD: Mauri Pole , MD Date of Birth: [birth year] CSN: [id number] Age: 72 Admit Type: Inpatient Procedure:                Upper GI endoscopy Indications:              Dysphagia. Proximal esophageal stricture Providers:                Mauri Pole, MD, Elmer Ramp. Tilden Dome, RN,                            Whitfield Dalton, Technician Referring MD:              Medicines:                Monitored Anesthesia Care Complications:            No immediate complications. Estimated Blood Loss:     Estimated blood loss was minimal. Procedure:                Pre-Anesthesia Assessment:                           - Prior to the procedure, a History and Physical                            was performed, and patient medications and                            allergies were reviewed. The patient's tolerance of                            previous anesthesia was also reviewed. The risks                            and benefits of the procedure and the sedation                            options and risks were discussed with the patient.                            All questions were answered, and informed consent                            was obtained. Prior Anticoagulants: The patient has                            taken no previous anticoagulant or antiplatelet                            agents. ASA Grade Assessment: II - A patient with                            mild systemic disease. After reviewing the risks  and benefits, the patient was deemed in                            satisfactory condition to undergo the procedure.                           After obtaining informed consent, the endoscope was                            passed under direct vision. Throughout the                            procedure, the patient's blood pressure, pulse, and                             oxygen saturations were monitored continuously. The                            [id number]-[id number] ([id number]) Olympus Ultra Slim EGD was                            introduced through the mouth, and advanced to the                            second part of duodenum. The GIF-[id number] ([id number])                            Olympus adult endoscope was introduced through the                            mouth, and advanced to the upper third of                            esophagus. The upper GI endoscopy was technically                            difficult and complex due to narrowing. Successful                            completion of the procedure was aided by                            withdrawing the scope and replacing with the                            pediatric endoscope. The patient tolerated the                            procedure well. Scope In: Scope Out: Findings:      One benign-appearing, intrinsic severe (stenosis; an endoscope cannot       pass) stenosis was found 24 to 25 cm from the incisors. This stenosis       measured 8 mm (inner diameter) x 1 cm (in length). The stenosis was  traversed after downsizing scope. A guidewire was placed and the scope       was withdrawn. Dilation was performed with a Savary dilator with mild       resistance at 8 mm, 9 mm and 10 mm. The dilation site was examined       following endoscope reinsertion and showed moderate mucosal disruption       and moderate improvement in luminal narrowing.      One superficial esophageal ulcer with no bleeding and no stigmata of       recent bleeding was found 23 to 24 cm from the incisors. The lesion was       1 mm in largest dimension.      The stomach was normal.      Food (residue) was found in the second portion of the duodenum. Removal       of food was accomplished. Impression:               - Benign-appearing esophageal stenosis. Dilated to                            51mm.                            - Non-bleeding esophageal ulcer.                           - Normal stomach.                           - Retained food in the duodenum. Removal was                            successful. Moderate Sedation:      N/A- Per Anesthesia Care Recommendation:           - Patient has a contact number available for                            emergencies. The signs and symptoms of potential                            delayed complications were discussed with the                            patient. Return to normal activities tomorrow.                            Written discharge instructions were provided to the                            patient.                           - Mechanical soft diet.                           - Continue present medications.                           -  No high dose aspirin, ibuprofen, naproxen, or                            other non-steroidal anti-inflammatory drugs.                           - Use sucralfate suspension 1 gram PO QID for 1                            month.                           - Use Prilosec (omeprazole) 40 mg PO twice daily.                           - Repeat EGD in 3-4 weeks with esophageal dilation.                            Can be scheduled along with colonoscopy at [facility] Procedure Code(s):        --- Professional ---                           [phone], Esophagogastroduodenoscopy, flexible,                            transoral; with removal of foreign body(s) Diagnosis Code(s):        --- Professional ---                           K22.2, Esophageal obstruction                           K22.10, Ulcer of esophagus without bleeding                           T18.3XXA, Foreign body in small intestine, initial                            encounter                           R13.10, Dysphagia, unspecified CPT copyright 2018 American Medical Association. All rights reserved. The codes documented in this report are preliminary and upon coder review may   be revised to meet current compliance requirements. Mauri Pole, MD 02/05/2018 11:17:31 AM This report has been signed electronically. Number of Addenda: 0

## 2018-02-05 NOTE — Transfer of Care (Signed)
Immediate Anesthesia Transfer of Care Note  Patient: Luke [name]  Procedure(s) Performed: ESOPHAGOGASTRODUODENOSCOPY (EGD) WITH PROPOFOL (N/A ) SAVORY DILATION (N/A )  Patient Location: PACU  Anesthesia Type:MAC  Level of Consciousness: awake, alert  and oriented  Airway & Oxygen Therapy: Patient Spontanous Breathing and Patient connected to nasal cannula oxygen  Post-op Assessment: Report given to RN and Post -op Vital signs reviewed and stable  Post vital signs: Reviewed and stable  Last Vitals:  Vitals Value Taken Time  BP    Temp    Pulse    Resp    SpO2      Last Pain:  Vitals:   02/05/18 0901  TempSrc: Oral  PainSc: 0-No pain         Complications: No apparent anesthesia complications

## 2018-02-05 NOTE — Anesthesia Preprocedure Evaluation (Addendum)
Anesthesia Evaluation  Patient identified by MRN, date of birth, ID band Patient awake    Reviewed: Allergy & Precautions, NPO status , Patient's Chart, lab work & pertinent test results, reviewed documented beta blocker date and time   Airway Mallampati: II  TM Distance: >3 FB Neck ROM: Full    Dental  (+) Upper Dentures, Lower Dentures, Dental Advisory Given   Pulmonary former smoker,    Pulmonary exam normal breath sounds clear to auscultation       Cardiovascular hypertension, Pt. on home beta blockers and Pt. on medications + CAD, + Past MI and + Peripheral Vascular Disease  Normal cardiovascular exam+ dysrhythmias (LBBB; intermittent complete heart block) Atrial Fibrillation + pacemaker + Cardiac Defibrillator  Rhythm:Regular Rate:Normal  Echo 07/16/17: Study Conclusions  - Left ventricle: The cavity size was normal. Wall thickness was normal. Systolic function was normal. The estimated ejection fraction was in the range of 55% to 60%. Wall motion was normal; there were no regional wall motion abnormalities. Doppler parameters are consistent with abnormal left ventricular relaxation (grade 1 diastolic dysfunction). - Mitral valve: There was mild regurgitation. - Pulmonary arteries: Systolic pressure was moderately increased. PA peak pressure: 40 mm Hg (S).   Neuro/Psych PSYCHIATRIC DISORDERS Anxiety negative neurological ROS     GI/Hepatic Neg liver ROS, GERD  Medicated,esophageal stricture   Endo/Other  diabetes, Type 2, Insulin Dependent, Oral Hypoglycemic Agents  Renal/GU Renal InsufficiencyRenal disease     Musculoskeletal negative musculoskeletal ROS (+)   Abdominal   Peds  Hematology negative hematology ROS (+)   Anesthesia Other Findings Day of surgery medications reviewed with the patient.  Reproductive/Obstetrics                            Anesthesia Physical Anesthesia  Plan  ASA: IV  Anesthesia Plan: MAC   Post-op Pain Management:    Induction: Intravenous  PONV Risk Score and Plan: 1 and Propofol infusion and Treatment may vary due to age or medical condition  Airway Management Planned: Simple Face Mask and Natural Airway  Additional Equipment:   Intra-op Plan:   Post-operative Plan:   Informed Consent: I have reviewed the patients History and Physical, chart, labs and discussed the procedure including the risks, benefits and alternatives for the proposed anesthesia with the patient or authorized representative who has indicated his/her understanding and acceptance.   Dental advisory given  Plan Discussed with: CRNA and Anesthesiologist  Anesthesia Plan Comments:         Anesthesia Quick Evaluation

## 2018-02-05 NOTE — Discharge Instructions (Signed)
YOU HAD AN ENDOSCOPIC PROCEDURE TODAY: Refer to the procedure report and other information in the discharge instructions given to you for any specific questions about what was found during the examination. If this information does not answer your questions, please call [place] office at [phone] to clarify.   YOU SHOULD EXPECT: Some feelings of bloating in the abdomen. Passage of more gas than usual. Walking can help get rid of the air that was put into your GI tract during the procedure and reduce the bloating. If you had a lower endoscopy (such as a colonoscopy or flexible sigmoidoscopy) you may notice spotting of blood in your stool or on the toilet paper. Some abdominal soreness may be present for a day or two, also.  DIET: Your first meal following the procedure should be a light meal and then it is ok to progress to your normal diet. A half-sandwich or bowl of soup is an example of a good first meal. Heavy or fried foods are harder to digest and may make you feel nauseous or bloated. Drink plenty of fluids but you should avoid alcoholic beverages for 24 hours. If you had a esophageal dilation, please see attached instructions for diet.    ACTIVITY: Your care partner should take you home directly after the procedure. You should plan to take it easy, moving slowly for the rest of the day. You can resume normal activity the day after the procedure however YOU SHOULD NOT DRIVE, use power tools, machinery or perform tasks that involve climbing or major physical exertion for 24 hours (because of the sedation medicines used during the test).   SYMPTOMS TO REPORT IMMEDIATELY: A gastroenterologist can be reached at any hour. Please call [phone]  for any of the following symptoms:   Following upper endoscopy (EGD, EUS, ERCP, esophageal dilation) Vomiting of blood or coffee ground material  New, significant abdominal pain  New, significant chest pain or pain under the shoulder blades  Painful or  persistently difficult swallowing  New shortness of breath  Black, tarry-looking or red, bloody stools  FOLLOW UP:  If any biopsies were taken you will be contacted by phone or by letter within the next 1-3 weeks. Call [phone]  if you have not heard about the biopsies in 3 weeks.  Please also call with any specific questions about appointments or follow up tests.  

## 2018-02-05 NOTE — Anesthesia Procedure Notes (Signed)
Procedure Name: MAC Date/Time: 02/05/2018 10:21 AM Performed by: Maxwell Caul, CRNA Pre-anesthesia Checklist: Patient identified, Emergency Drugs available, Suction available and Patient being monitored Oxygen Delivery Method: Nasal cannula

## 2018-02-05 NOTE — H&P (Signed)
[facility] History and Physical   Primary Care Physician:  Tonia Ghent, MD   Reason for Procedure:  Dysphagia, esophageal stricture  Plan:    EGD with esophageal dilation    HPI: Luke [name] is a 72 y.o. male with tight proximal esophageal stricture, unable to traverse with adult endoscope is here for EGD with dilation.  The risks and benefits as well as alternatives of endoscopic procedure(s) have been discussed and reviewed. All questions answered. The patient agrees to proceed.   Past Medical History:  Diagnosis Date  . AAA (abdominal aortic aneurysm) ([place]) 2011   Per vascular surgery  . Anxiety    no med. in use for anxiety but pt. speaks openly of his stress & anxious feelings regarding impending surgery    . Atrial fibrillation ([place])   . Automatic implantable cardioverter-defibrillator in situ   . CAD (coronary artery disease)    Presumed CAD with nuclear scan October 09, 2011,  large anteroseptal MI and inferior MI. Catheterization scheduled October 15, 2011  . Cardiomyopathy [facility])    Nuclear, October 09, 2011, EF 30%, multiple focal wall motion abnormalities  . Diabetes mellitus    type II  . Drug therapy    Hyperkalemia with spironolactone  . Ejection fraction < 50%    EF 30%, nuclear, October 09, 2011  . Ejection fraction < 50%    EF previously 30%  //   EF 30-35%, echo, July 14, 2012, severe diffuse hypokinesis, PA pressure 43 mm mercury  . Fall due to ice or snow Feb.  19, 2015  . HLD (hyperlipidemia)   . Hypertension    white coat HTN-- often elevated in office and controlled on outside checks.  . ICD (implantable cardioverter-defibrillator) in place    CRT-D placed March, 2014 complete heart block and k dysfunction  . LBBB (left bundle branch block)    LBBB on EKG October 11, 2011,  no prior EKG has been done  . Low testosterone    Hx of  . Myocardial infarction ([place])   . Pacemaker   . PAD (peripheral artery disease) ([place])   . Pancreatitis      Past Surgical History:  Procedure Laterality Date  . ABDOMINAL AORTAGRAM N/A 10/28/2011   Procedure: ABDOMINAL [name];  Surgeon: Angelia Mould, MD;  Location: [facility] C[facility] LAB;  Service: Cardiovascular;  Laterality: N/A;  . ABDOMINAL AORTIC ANEURYSM REPAIR     EVAR   . BI-VENTRICULAR IMPLANTABLE CARDIOVERTER DEFIBRILLATOR N/A 07/16/2012   Procedure: BI-VENTRICULAR IMPLANTABLE CARDIOVERTER DEFIBRILLATOR  (CRT-D);  Surgeon: Deboraha Sprang, MD;  Location: [facility] CATH LAB;  Service: Cardiovascular;  Laterality: N/A;  . CARDIAC CATHETERIZATION    . COLONOSCOPY    . HERNIA REPAIR  Jan. 9, 2015  . INSERTION OF MESH N/A 05/14/2013   Procedure: INSERTION OF MESH;  Surgeon: Adin Hector, MD;  Location: [place];  Service: General;  Laterality: N/A;  . LOWER EXTREMITY ANGIOGRAM Bilateral 10/28/2011   Procedure: LOWER EXTREMITY ANGIOGRAM;  Surgeon: Angelia Mould, MD;  Location: [facility] CATH LAB;  Service: Cardiovascular;  Laterality: Bilateral;  . PACEMAKER INSERTION  07-16-12   pacemaker/defibrilator  . POSTERIOR CERVICAL FUSION/FORAMINOTOMY N/A 06/09/2014   Procedure: Laminectomy - Cervical two-Cervcial four posterior cervical instrumented fusion Cervical two-cervical four;  Surgeon: Eustace Moore, MD;  Location: [place] NEURO ORS;  Service: Neurosurgery;  Laterality: N/A;  posterior   . TONSILLECTOMY     as a child   . UMBILICAL HERNIA REPAIR N/A  05/14/2013   Procedure: LAPAROSCOPIC exploration and repair of hernia in abdominal ;  Surgeon: Adin Hector, MD;  Location: [place];  Service: General;  Laterality: N/A;    Prior to Admission medications   Medication Sig Start Date End Date Taking? Authorizing Provider  amLODipine (NORVASC) 10 MG tablet Take 1 tablet (10 mg total) by mouth daily. 11/15/17  Yes Oswald Hillock, MD  aspirin EC 81 MG tablet Take 81 mg by mouth daily.   Yes [provider]  carvedilol (COREG) 25 MG tablet TAKE 1 TABLET BY MOUTH TWICE DAILY WITH MEALS Patient taking  differently: Take 25 mg by mouth 2 (two) times daily with a meal.  12/31/17  Yes Jerline Pain, MD  Cholecalciferol (VITAMIN D PO) Take 1,200 Units by mouth daily.    Yes [provider]  glipiZIDE (GLUCOTROL) 5 MG tablet TAKE 2 TABLETS(10 MG) BY MOUTH TWICE DAILY BEFORE A MEAL Patient taking differently: Take 10 mg by mouth 2 (two) times daily before a meal.  11/17/17  Yes Tonia Ghent, MD  hydrALAZINE (APRESOLINE) 25 MG tablet Take 1 tablet (25 mg total) by mouth 3 (three) times daily. 11/14/17  Yes Oswald Hillock, MD  Insulin Glargine (LANTUS SOLOSTAR) 100 UNIT/ML Solostar Pen Inject 20 Units into the skin daily at 10 pm. Patient taking differently: Inject 25 Units into the skin daily at 10 pm.  11/18/17  Yes Tonia Ghent, MD  metFORMIN (GLUCOPHAGE) 500 MG tablet TAKE 2 TABLETS BY MOUTH TWICE DAILY WITH FOOD Patient taking differently: Take 1,000 mg by mouth 2 (two) times daily with a meal. TAKE 2 TABLETS BY MOUTH TWICE DAILY WITH FOOD 11/17/17  Yes Tonia Ghent, MD  Multiple Vitamin (MULTIVITAMIN) tablet Take 1 tablet by mouth daily.    Yes [provider]  omeprazole (PRILOSEC) 40 MG capsule Take 1 capsule (40 mg total) by mouth 2 (two) times daily before a meal. 01/29/18 02/28/18 Yes Nandigam, Venia Minks, MD  sildenafil (REVATIO) 20 MG tablet TAKE THREE TO FOUR TABLETS BY MOUTH DAILY AS NEEDED FOR ED 11/18/17  Yes Tonia Ghent, MD  simvastatin (ZOCOR) 40 MG tablet TAKE 1 TABLET(40 MG) BY MOUTH AT BEDTIME Patient taking differently: Take 40 mg by mouth at bedtime.  01/07/18  Yes Tonia Ghent, MD  vitamin B-12 (CYANOCOBALAMIN) 500 MCG tablet Take 500 mcg by mouth daily.   Yes [provider]  glucose blood (ACCU-CHEK AVIVA PLUS) test strip CHECK BLOOD SUGAR TWICE A DAY AND AS DIRECTED. DX. E11.59 09/04/17   Tonia Ghent, MD  Insulin Pen Needle (PENTIPS) 31G X 5 MM MISC Use daily with insulin pen 07/10/17   Tonia Ghent, MD  Na Sulfate-K Sulfate-Mg Sulf  17.5-3.13-1.6 GM/177ML SOLN Suprep-Use as directed 01/15/18   Willia Craze, NP  omeprazole (PRILOSEC) 20 MG capsule Take 1 capsule (20 mg total) by mouth daily. Patient not taking: Reported on 02/03/2018 11/18/17   Tonia Ghent, MD    Current Facility-Administered Medications  Medication Dose Route Frequency Provider Last Rate Last Dose  . 0.9 %  sodium chloride infusion   Intravenous Continuous Nandigam, Kavitha V, MD      . lactated ringers infusion   Intravenous Continuous Harl Bowie V, MD 125 mL/hr at 02/05/18 0935 1,000 mL at 02/05/18 0935    Allergies as of 01/29/2018 - Review Complete 01/29/2018  Allergen Reaction Noted  . Aldactone [spironolactone] Other (See Comments) 04/20/2013  . Codeine Rash and Hives   .  Januvia [sitagliptin] Other (See Comments) 11/21/2016  . Lisinopril  11/18/2017    Family History  Problem Relation Age of Onset  . Hypertension Mother   . Stroke Mother   . Hyperlipidemia Mother   . Diabetes Sister   . Heart disease Sister        Before age 84  . Hypertension Sister   . Hyperlipidemia Sister   . Heart attack Sister   . Cancer Father        Lung  . Hypertension Son   . Colon cancer Neg Hx   . Prostate cancer Neg Hx     Social History   Socioeconomic History  . Marital status: Married    Spouse name: Not on file  . Number of children: Not on file  . Years of education: Not on file  . Highest education level: Not on file  Occupational History  . Not on file  Social Needs  . Financial resource strain: Not on file  . Food insecurity:    Worry: Not on file    Inability: Not on file  . Transportation needs:    Medical: Not on file    Non-medical: Not on file  Tobacco Use  . Smoking status: Former Smoker    Packs/day: 2.00    Years: 40.00    Pack years: 80.00    Types: Cigarettes    Last attempt to quit: 05/06/2004    Years since quitting: 13.7  . Smokeless tobacco: Never Used  Substance and Sexual Activity  . Alcohol  use: Yes    Alcohol/week: 0.0 - 2.0 standard drinks    Comment: beer or wine  occassionally  . Drug use: No  . Sexual activity: Yes  Lifestyle  . Physical activity:    Days per week: Not on file    Minutes per session: Not on file  . Stress: Not on file  Relationships  . Social connections:    Talks on phone: Not on file    Gets together: Not on file    Attends religious service: Not on file    Active member of club or organization: Not on file    Attends meetings of clubs or organizations: Not on file    Relationship status: Not on file  . Intimate partner violence:    Fear of current or ex partner: Not on file    Emotionally abused: Not on file    Physically abused: Not on file    Forced sexual activity: Not on file  Other Topics Concern  . 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      Review of Systems:  All other review of systems negative except as mentioned in the HPI.  Physical Exam: Vital signs in last 24 hours: Temp:  [97.8 F (36.6 C)] 97.8 F (36.6 C) (10/03 0901) Pulse Rate:  [71] 71 (10/03 0901) Resp:  [14] 14 (10/03 0901) BP: (169)/(71) 169/71 (10/03 0901) SpO2:  [94 %] 94 % (10/03 0901) Weight:  [83.9 kg] 83.9 kg (10/03 0901)   General:   Alert,  Well-developed, well-nourished, pleasant and cooperative in NAD Lungs:  Clear throughout to auscultation.   Heart:  Regular rate and rhythm; no murmurs, clicks, rubs,  or gallops. Abdomen:  Soft, nontender and nondistended. Normal bowel sounds.   Neuro/Psych:  Alert and cooperative. Normal mood and affect. A and O x 3   K. Denzil Magnuson , MD [phone]

## 2018-02-06 ENCOUNTER — Encounter (HOSPITAL_COMMUNITY): Payer: Self-pay | Admitting: Gastroenterology

## 2018-02-06 NOTE — Anesthesia Postprocedure Evaluation (Signed)
Anesthesia Post Note  Patient: LANSON RANDLE  Procedure(s) Performed: ESOPHAGOGASTRODUODENOSCOPY (EGD) WITH PROPOFOL (N/A ) SAVORY DILATION (N/A )     Patient location during evaluation: PACU Anesthesia Type: MAC Level of consciousness: awake and alert, awake and oriented Pain management: pain level controlled Vital Signs Assessment: post-procedure vital signs reviewed and stable Respiratory status: spontaneous breathing, nonlabored ventilation and respiratory function stable Cardiovascular status: stable and blood pressure returned to baseline Postop Assessment: no apparent nausea or vomiting Anesthetic complications: no    Last Vitals:  Vitals:   02/05/18 1110 02/05/18 1130  BP: 117/65 132/65  Pulse: 63 60  Resp: 17 14  Temp: 36.8 C   SpO2: 95% 93%    Last Pain:  Vitals:   02/05/18 1130  TempSrc:   PainSc: 0-No pain                 Catalina Gravel

## 2018-02-09 ENCOUNTER — Telehealth: Payer: Self-pay | Admitting: Gastroenterology

## 2018-02-09 NOTE — Telephone Encounter (Signed)
Pt called stating that his pharmacy told him that they faxed a form that needs to be filled out in order to refill medication, he was not sure if it needs PA. Pt wants to know if we received form and the status of it.

## 2018-02-10 ENCOUNTER — Other Ambulatory Visit: Payer: Self-pay

## 2018-02-10 DIAGNOSIS — K222 Esophageal obstruction: Secondary | ICD-10-CM

## 2018-02-10 MED ORDER — SUCRALFATE 1 G PO TABS: 1.0000 g | ORAL_TABLET | Freq: Three times a day (TID) | ORAL | 1 refills | Status: DC

## 2018-02-10 NOTE — Telephone Encounter (Signed)
Changed the Rx to tablets. This is covered by the insurance. Spoke with the patient. Instructed on crushing tablets and adding water to make a "slurry."

## 2018-02-11 ENCOUNTER — Other Ambulatory Visit: Payer: Self-pay

## 2018-02-12 ENCOUNTER — Other Ambulatory Visit: Payer: Self-pay | Admitting: Family Medicine

## 2018-02-12 ENCOUNTER — Encounter: Payer: Self-pay | Admitting: Family Medicine

## 2018-02-12 ENCOUNTER — Ambulatory Visit (INDEPENDENT_AMBULATORY_CARE_PROVIDER_SITE_OTHER): Payer: Medicare Other | Admitting: Family Medicine

## 2018-02-12 VITALS — BP 152/70 | HR 65 | Temp 97.9°F | Ht 73.0 in | Wt 185.5 lb

## 2018-02-12 DIAGNOSIS — Z23 Encounter for immunization: Secondary | ICD-10-CM | POA: Diagnosis not present

## 2018-02-12 DIAGNOSIS — K222 Esophageal obstruction: Secondary | ICD-10-CM

## 2018-02-12 DIAGNOSIS — I714 Abdominal aortic aneurysm, without rupture, unspecified: Secondary | ICD-10-CM

## 2018-02-12 DIAGNOSIS — I255 Ischemic cardiomyopathy: Secondary | ICD-10-CM | POA: Diagnosis not present

## 2018-02-12 DIAGNOSIS — I10 Essential (primary) hypertension: Secondary | ICD-10-CM | POA: Diagnosis not present

## 2018-02-12 DIAGNOSIS — Z0000 Encounter for general adult medical examination without abnormal findings: Secondary | ICD-10-CM | POA: Diagnosis not present

## 2018-02-12 DIAGNOSIS — E1159 Type 2 diabetes mellitus with other circulatory complications: Secondary | ICD-10-CM

## 2018-02-12 DIAGNOSIS — E785 Hyperlipidemia, unspecified: Secondary | ICD-10-CM | POA: Diagnosis not present

## 2018-02-12 DIAGNOSIS — K858 Other acute pancreatitis without necrosis or infection: Secondary | ICD-10-CM

## 2018-02-12 DIAGNOSIS — I739 Peripheral vascular disease, unspecified: Secondary | ICD-10-CM | POA: Diagnosis not present

## 2018-02-12 DIAGNOSIS — Z7189 Other specified counseling: Secondary | ICD-10-CM

## 2018-02-12 LAB — CBC WITH DIFFERENTIAL/PLATELET
Basophils Absolute: 0.1 10*3/uL (ref 0.0–0.1)
Basophils Relative: 0.7 % (ref 0.0–3.0)
Eosinophils Absolute: 0.2 10*3/uL (ref 0.0–0.7)
Eosinophils Relative: 2.2 % (ref 0.0–5.0)
HCT: 41.3 % (ref 39.0–52.0)
Hemoglobin: 14 g/dL (ref 13.0–17.0)
Lymphocytes Relative: 19.8 % (ref 12.0–46.0)
Lymphs Abs: 2.2 10*3/uL (ref 0.7–4.0)
MCHC: 33.9 g/dL (ref 30.0–36.0)
MCV: 95.4 fl (ref 78.0–100.0)
Monocytes Absolute: 0.9 10*3/uL (ref 0.1–1.0)
Monocytes Relative: 7.8 % (ref 3.0–12.0)
Neutro Abs: 7.8 10*3/uL — ABNORMAL HIGH (ref 1.4–7.7)
Neutrophils Relative %: 69.5 % (ref 43.0–77.0)
Platelets: 286 10*3/uL (ref 150.0–400.0)
RBC: 4.33 Mil/uL (ref 4.22–5.81)
RDW: 13.4 % (ref 11.5–15.5)
WBC: 11.2 10*3/uL — ABNORMAL HIGH (ref 4.0–10.5)

## 2018-02-12 LAB — LIPID PANEL
Cholesterol: 138 mg/dL (ref 0–200)
HDL: 34.5 mg/dL — ABNORMAL LOW (ref >39.00)
NonHDL: 103.17
Total CHOL/HDL Ratio: 4
Triglycerides: 342 mg/dL — ABNORMAL HIGH (ref 0.0–149.0)
VLDL: 68.4 mg/dL — ABNORMAL HIGH (ref 0.0–40.0)

## 2018-02-12 LAB — TSH: TSH: 2.75 u[IU]/mL (ref 0.35–4.50)

## 2018-02-12 LAB — COMPREHENSIVE METABOLIC PANEL
ALT: 17 U/L (ref 0–53)
AST: 15 U/L (ref 0–37)
Albumin: 4.2 g/dL (ref 3.5–5.2)
Alkaline Phosphatase: 59 U/L (ref 39–117)
BUN: 24 mg/dL — ABNORMAL HIGH (ref 6–23)
CO2: 30 mEq/L (ref 19–32)
Calcium: 9.9 mg/dL (ref 8.4–10.5)
Chloride: 100 mEq/L (ref 96–112)
Creatinine, Ser: 1.15 mg/dL (ref 0.40–1.50)
GFR: 66.56 mL/min (ref >60.00)
Glucose, Bld: 180 mg/dL — ABNORMAL HIGH (ref 70–99)
Potassium: 4.9 mEq/L (ref 3.5–5.1)
Sodium: 136 mEq/L (ref 135–145)
Total Bilirubin: 0.4 mg/dL (ref 0.2–1.2)
Total Protein: 7.3 g/dL (ref 6.0–8.3)

## 2018-02-12 LAB — LDL CHOLESTEROL, DIRECT: Direct LDL: 70 mg/dL

## 2018-02-12 LAB — HEMOGLOBIN A1C: Hgb A1c MFr Bld: 7.6 % — ABNORMAL HIGH (ref 4.6–6.5)

## 2018-02-12 MED ORDER — SIMVASTATIN 40 MG PO TABS: 40.0000 mg | ORAL_TABLET | Freq: Every day | ORAL | Status: DC

## 2018-02-12 MED ORDER — INSULIN GLARGINE 100 UNIT/ML SOLOSTAR PEN: 25.0000 [IU] | PEN_INJECTOR | Freq: Every day | SUBCUTANEOUS | Status: DC

## 2018-02-12 NOTE — Patient Instructions (Addendum)
Shingles and tetanus may be cheaper at the pharmacy.   Flu shot today.  Cancel the appointment with [facility] tomorrow.  Take care.  Glad to see you.  Go to the lab on the way out.  We'll contact you with your lab report. We'll see about when to follow up when I see your labs.    Call about an appointment with Dr. Milinda Pointer.  [facility]) [address] [place], [place] [address] [phone]   Let me know if you need a referral.

## 2018-02-12 NOTE — Progress Notes (Signed)
I have personally reviewed the Medicare Annual Wellness questionnaire and have noted 1. The patient's medical and social history 2. Their use of alcohol, tobacco or illicit drugs 3. Their current medications and supplements 4. The patient's functional ability including ADL's, fall risks, home safety risks and hearing or visual             impairment. 5. Diet and physical activities 6. Evidence for depression or mood disorders  The patients weight, height, BMI have been recorded in the chart and visual acuity is per eye clinic.  I have made referrals, counseling and provided education to the patient based review of the above and I have provided the pt with a written personalized care plan for preventive services.  Provider list updated- see scanned forms.  Routine anticipatory guidance given to patient.  See health maintenance. The possibility exists that previously documented standard health maintenance information may have been brought forward from a previous encounter into this note.  If needed, that same information has been updated to reflect the current situation based on today's encounter.    Flu 2019 Shingles out of stock PNA up-to-date Tetanus 2002, discussed with patient that it may be cheaper at the pharmacy.   Colon cancer screening-follow-up pending. Prostate cancer screening and PSA options (with potential risks and benefits of testing vs not testing) were discussed along with recent recs/guidelines.  He declined testing PSA at this point. Advance directive- wife designated if patient were incapacitated.  Cognitive function addressed- see scanned forms- and if abnormal then additional documentation follows.   PAD is better with walking and inc exercise capacity.  He can walk on the elliptical for 1 hour w/o leg pain.    S/p AAA repair.  No abd pain.  He is seeing vascular next year.    Elevated Cholesterol: Using medications without problems:yes Muscle aches: no Diet  compliance: yes Exercise:yes We talked about simvastatin and amlodipine but he has tolerated them together.  No reason to change meds at this point, d/w pt.   Diabetes:  Using medications without difficulties: yes Hypoglycemic episodes:no Hyperglycemic episodes:no Feet problems: he has some tingling at baseline, R>L, worse after the prev fall- known issue.  He has callus treated by foot clinic.  He is wearing orthotics.   Blood Sugars averaging: at night usually ~84-150, in the AM, usually 90-150s eye exam within last year: yes Due for labs.   Hypertension:    Using medication without problems or lightheadedness: yes Chest pain with exertion:no Edema:no Short of breath:no Average home BPs: 117/72 at home this AM.  He has white coat HTN.   S/p dilation and on carafate and PPI currently.  Swallowing much better now.  He is careful with chewing and taking small bites.  He never choked, discussed.    PMH and SH reviewed  Meds, vitals, and allergies reviewed.   ROS: Per HPI.  Unless specifically indicated otherwise in HPI, the patient denies:  General: fever. Eyes: acute vision changes ENT: sore throat Cardiovascular: chest pain Respiratory: SOB GI: vomiting GU: dysuria Musculoskeletal: acute back pain Derm: acute rash Neuro: acute motor dysfunction Psych: worsening mood Endocrine: polydipsia Heme: bleeding Allergy: hayfever  GEN: nad, alert and oriented HEENT: mucous membranes moist NECK: supple w/o LA CV: rrr. PULM: ctab, no inc wob ABD: soft, +bs EXT: no edema SKIN: no acute rash  Diabetic foot exam: Normal inspection No skin breakdown Callus noted on right foot first metatarsal distally Decreased bilateral DP pulses, his dorsalis pedis pulse  is easier to palpate on the left foot compared to the right. Decreased sensation sensation to light touch and monofilament Nails slightly thick

## 2018-02-13 ENCOUNTER — Ambulatory Visit: Payer: Medicare Other

## 2018-02-13 ENCOUNTER — Other Ambulatory Visit: Payer: Self-pay | Admitting: *Deleted

## 2018-02-13 MED ORDER — HYDRALAZINE HCL 25 MG PO TABS: 25.0000 mg | ORAL_TABLET | Freq: Three times a day (TID) | ORAL | 2 refills | Status: DC

## 2018-02-15 NOTE — Assessment & Plan Note (Signed)
Continue current medications as is.  He is tolerating combination of simvastatin and amlodipine.  No adverse effect.  No reason to change meds at this point.  He agrees.  Continue work on diet and exercise.  See notes on labs.

## 2018-02-15 NOTE — Assessment & Plan Note (Signed)
The patient never had any choking sensation.  He is status post dilation on Carafate and PPI currently.  He is swallowing much better.  Routine cautions given.  Update me as needed.  He agrees.

## 2018-02-15 NOTE — Assessment & Plan Note (Signed)
Advance directive- wife designated if patient were incapacitated.  

## 2018-02-15 NOTE — Assessment & Plan Note (Signed)
History of but no abdominal symptoms now.  Okay for outpatient follow-up.

## 2018-02-15 NOTE — Assessment & Plan Note (Signed)
History of whitecoat elevation.  Improved on home checks.  Continue as is.  No change in meds.  See notes on labs.  He agrees.

## 2018-02-15 NOTE — Assessment & Plan Note (Signed)
Flu 2019 Shingles out of stock PNA up-to-date Tetanus 2002, discussed with patient that it may be cheaper at the pharmacy.   Colon cancer screening-follow-up pending. Prostate cancer screening and PSA options (with potential risks and benefits of testing vs not testing) were discussed along with recent recs/guidelines.  He declined testing PSA at this point. Advance directive- wife designated if patient were incapacitated.  Cognitive function addressed- see scanned forms- and if abnormal then additional documentation follows.

## 2018-02-15 NOTE — Assessment & Plan Note (Signed)
Clearly better in the meantime.  He can walk for up to an hour on the elliptical without leg pain.  No change in current medications.  He agrees.  No leg ulceration.  Routine foot care discussed with patient.

## 2018-02-15 NOTE — Assessment & Plan Note (Signed)
Status post repair.  No abdominal pain.  He is seen vascular surgery next year.  Routine cautions discussed with patient.

## 2018-02-15 NOTE — Assessment & Plan Note (Signed)
Sugars been controlled at home.  Check A1c.  Continue work on diet and exercise.  Foot care discussed with patient.  He is going to call about follow-up with the foot clinic.  See after visit summary.  He has orthotics.  See notes on labs.

## 2018-02-16 ENCOUNTER — Telehealth: Payer: Self-pay | Admitting: Family Medicine

## 2018-02-16 DIAGNOSIS — K859 Acute pancreatitis without necrosis or infection, unspecified: Secondary | ICD-10-CM

## 2018-02-16 DIAGNOSIS — K861 Other chronic pancreatitis: Secondary | ICD-10-CM

## 2018-02-16 NOTE — Telephone Encounter (Signed)
Call Pt.  I didn't mention at [facility] but GI had mentioned f/u CT re: pancreas when his sx resolved to make sure there wasn't a persistent anomaly that would predispose him to pancreatitis.  Reasonable to get done.  I put in the order.  Thanks.

## 2018-02-16 NOTE — Telephone Encounter (Signed)
Left detailed message on voicemail. DPR 

## 2018-02-17 DIAGNOSIS — L97511 Non-pressure chronic ulcer of other part of right foot limited to breakdown of skin: Secondary | ICD-10-CM | POA: Diagnosis not present

## 2018-03-03 ENCOUNTER — Telehealth: Payer: Self-pay

## 2018-03-03 NOTE — Telephone Encounter (Signed)
-----   Message from Greggory Keen, LPN sent at 9/39/6886  9:14 AM EDT ----- follow up imaging of pancreas (CT abdomen pancreas protocol ) to exclude any neoplastic lesion in 6-8 weeks after resolution of acute pancreatitis.

## 2018-03-03 NOTE — Telephone Encounter (Signed)
See the office visit of 01/15/18 for physician orders.  Left a message at the home requesting a return call to schedule the CT of the abdomen pancreas protocol.

## 2018-03-05 ENCOUNTER — Encounter: Payer: Medicare Other | Admitting: Gastroenterology

## 2018-03-05 ENCOUNTER — Other Ambulatory Visit: Payer: Self-pay

## 2018-03-05 DIAGNOSIS — K858 Other acute pancreatitis without necrosis or infection: Secondary | ICD-10-CM

## 2018-03-06 ENCOUNTER — Other Ambulatory Visit: Payer: Self-pay | Admitting: *Deleted

## 2018-03-06 DIAGNOSIS — M71571 Other bursitis, not elsewhere classified, right ankle and foot: Secondary | ICD-10-CM | POA: Diagnosis not present

## 2018-03-06 NOTE — Patient Outreach (Signed)
[facility]) Care Management  03/06/2018  Luke [name] Feb 18, 1946 [id number]   RN Health Coach Quarterly Follow Up  Referral Date:11/14/2017 Referral Source:Hospital Liaison Reason for Referral:Disease Management Education Insurance:Medicare   Outreach Attempt:  Outreach attempt #1 to patient for quarterly follow up. No answer. RN Health Coach left HIPAA compliant voicemail message along with contact information.  Plan:  RN Health Coach will make another outreach attempt in the month of November.  [place] Coach [phone] [email or web address] [email or web address]

## 2018-03-09 NOTE — Telephone Encounter (Signed)
Patient is aware. Instructed.

## 2018-03-16 ENCOUNTER — Other Ambulatory Visit: Payer: Self-pay | Admitting: *Deleted

## 2018-03-16 ENCOUNTER — Telehealth: Payer: Self-pay

## 2018-03-16 ENCOUNTER — Telehealth: Payer: Self-pay | Admitting: *Deleted

## 2018-03-16 MED ORDER — AMLODIPINE BESYLATE 10 MG PO TABS: 10.0000 mg | ORAL_TABLET | Freq: Every day | ORAL | 3 refills | Status: DC

## 2018-03-16 NOTE — Telephone Encounter (Signed)
Spoke with patient and made him aware of message below. He verbalized his understanding and appreciation for the return call.

## 2018-03-16 NOTE — Telephone Encounter (Signed)
Patient has an EGD/colon scheduled for 03/23/18. He cannot afford the Suprep he has been prescribed. He will be on a 2 day colon prep. Able to secure a sample kit for him. He will pick this up at our office. He reports he is starting to have some difficulty and discomfort with swallowing again. He is able to get fluids and food down, but he has had a noticeable change. He has choked a couple of times.

## 2018-03-16 NOTE — Telephone Encounter (Signed)
Patient called and requested a refill on amlodipine. This was last ordered by Dr Darrick Meigs for 10 mg. Dr Marlou Porch has never refilled this dose. It was changed in the hospital but the patient stated that this might be the reason that he doesn't feel right. He would like to know Dr Marlou Porch advice on what dose would be appropriate. Please advise. Thanks, MI

## 2018-03-16 NOTE — Telephone Encounter (Signed)
He can do the Miralax 1st day with soft diet and suprep on 2 nd day with clear liquids. Please advise him to avoid any tough meat or bread. Only soft foods until the EGD. Thanks

## 2018-03-16 NOTE — Telephone Encounter (Signed)
Patient says he is "very careful" with his diet. Written instructions provided for the prep.

## 2018-03-16 NOTE — Telephone Encounter (Signed)
Reviewed chart - amlodipine was increased to 10 mg on 11/15/17.  He has seen both Dr Marlou Porch and has PCP-Dr Damita Dunnings (02/12/18 no complaint then) since his discharge.  OK to refill 10 mg dose.  If pt is not feeling well he should contact his PCP for further evaluation.

## 2018-03-17 ENCOUNTER — Other Ambulatory Visit: Payer: Self-pay

## 2018-03-23 ENCOUNTER — Encounter: Payer: Medicare Other | Admitting: Gastroenterology

## 2018-03-23 ENCOUNTER — Encounter: Payer: Self-pay | Admitting: Gastroenterology

## 2018-03-23 ENCOUNTER — Ambulatory Visit (AMBULATORY_SURGERY_CENTER): Payer: Medicare Other | Admitting: Gastroenterology

## 2018-03-23 VITALS — BP 127/67 | HR 61 | Temp 98.4°F | Resp 13 | Ht 73.0 in | Wt 185.0 lb

## 2018-03-23 DIAGNOSIS — Z1211 Encounter for screening for malignant neoplasm of colon: Secondary | ICD-10-CM | POA: Diagnosis not present

## 2018-03-23 DIAGNOSIS — D123 Benign neoplasm of transverse colon: Secondary | ICD-10-CM | POA: Diagnosis not present

## 2018-03-23 DIAGNOSIS — K222 Esophageal obstruction: Secondary | ICD-10-CM

## 2018-03-23 DIAGNOSIS — R1310 Dysphagia, unspecified: Secondary | ICD-10-CM | POA: Diagnosis not present

## 2018-03-23 DIAGNOSIS — D124 Benign neoplasm of descending colon: Secondary | ICD-10-CM | POA: Diagnosis not present

## 2018-03-23 DIAGNOSIS — R1319 Other dysphagia: Secondary | ICD-10-CM

## 2018-03-23 DIAGNOSIS — K5710 Diverticulosis of small intestine without perforation or abscess without bleeding: Secondary | ICD-10-CM | POA: Diagnosis not present

## 2018-03-23 DIAGNOSIS — K228 Other specified diseases of esophagus: Secondary | ICD-10-CM | POA: Diagnosis not present

## 2018-03-23 DIAGNOSIS — K219 Gastro-esophageal reflux disease without esophagitis: Secondary | ICD-10-CM | POA: Diagnosis not present

## 2018-03-23 DIAGNOSIS — D122 Benign neoplasm of ascending colon: Secondary | ICD-10-CM | POA: Diagnosis not present

## 2018-03-23 MED ORDER — SODIUM CHLORIDE 0.9 % IV SOLN: 500.0000 mL | Freq: Once | INTRAVENOUS | Status: DC

## 2018-03-23 MED ORDER — ESOMEPRAZOLE MAGNESIUM 40 MG PO CPDR: 40.0000 mg | DELAYED_RELEASE_CAPSULE | Freq: Two times a day (BID) | ORAL | 3 refills | Status: DC

## 2018-03-23 NOTE — Patient Instructions (Addendum)
Discharge instructions given. Prescription sent to pharmacy. Office will schedule return office visit in 2 months. No ibuprofen, naproxen, or other NSAIDs for two weeks. Handout on a dilatation diet. YOU HAD AN ENDOSCOPIC PROCEDURE TODAY AT [facility]:   Refer to the procedure report that was given to you for any specific questions about what was found during the examination.  If the procedure report does not answer your questions, please call your gastroenterologist to clarify.  If you requested that your care partner not be given the details of your procedure findings, then the procedure report has been included in a sealed envelope for you to review at your convenience later.  YOU SHOULD EXPECT: Some feelings of bloating in the abdomen. Passage of more gas than usual.  Walking can help get rid of the air that was put into your GI tract during the procedure and reduce the bloating. If you had a lower endoscopy (such as a colonoscopy or flexible sigmoidoscopy) you may notice spotting of blood in your stool or on the toilet paper. If you underwent a bowel prep for your procedure, you may not have a normal bowel movement for a few days.  Please Note:  You might notice some irritation and congestion in your nose or some drainage.  This is from the oxygen used during your procedure.  There is no need for concern and it should clear up in a day or so.  SYMPTOMS TO REPORT IMMEDIATELY:   Following lower endoscopy (colonoscopy or flexible sigmoidoscopy):  Excessive amounts of blood in the stool  Significant tenderness or worsening of abdominal pains  Swelling of the abdomen that is new, acute  Fever of 100F or higher   Following upper endoscopy (EGD)  Vomiting of blood or coffee ground material  New chest pain or pain under the shoulder blades  Painful or persistently difficult swallowing  New shortness of breath  Fever of 100F or higher  Black, tarry-looking stools  For  urgent or emergent issues, a gastroenterologist can be reached at any hour by calling [phone].   DIET:  We do recommend a small meal at first, but then you may proceed to your regular diet.  Drink plenty of fluids but you should avoid alcoholic beverages for 24 hours.  ACTIVITY:  You should plan to take it easy for the rest of today and you should NOT DRIVE or use heavy machinery until tomorrow (because of the sedation medicines used during the test).    FOLLOW UP: Our staff will call the number listed on your records the next business day following your procedure to check on you and address any questions or concerns that you may have regarding the information given to you following your procedure. If we do not reach you, we will leave a message.  However, if you are feeling well and you are not experiencing any problems, there is no need to return our call.  We will assume that you have returned to your regular daily activities without incident.  If any biopsies were taken you will be contacted by phone or by letter within the next 1-3 weeks.  Please call us at [phone] if you have not heard about the biopsies in 3 weeks.    SIGNATURES/CONFIDENTIALITY: You and/or your care partner have signed paperwork which will be entered into your electronic medical record.  These signatures attest to the fact that that the information above on your After Visit Summary has been reviewed and  is understood.  Full responsibility of the confidentiality of this discharge information lies with you and/or your care-partner.

## 2018-03-23 NOTE — Progress Notes (Signed)
I have reviewed the patient's medical history in detail and updated the computerized patient record.

## 2018-03-23 NOTE — Progress Notes (Signed)
PT taken to PACU. Monitors in place. VSS. Report given to RN. 

## 2018-03-23 NOTE — Op Note (Signed)
[facility] Patient Name: Luke [name] Procedure Date: 03/23/2018 10:12 AM MRN: [id number] Endoscopist: Mauri Pole , MD Age: 72 Referring MD:  Date of Birth: [birth year] Gender: Male Account #: [id number] Procedure:                Colonoscopy Indications:              Screening for colorectal malignant neoplasm Medicines:                Monitored Anesthesia Care Procedure:                Pre-Anesthesia Assessment:                           - Prior to the procedure, a History and Physical                            was performed, and patient medications and                            allergies were reviewed. The patient's tolerance of                            previous anesthesia was also reviewed. The risks                            and benefits of the procedure and the sedation                            options and risks were discussed with the patient.                            All questions were answered, and informed consent                            was obtained. Prior Anticoagulants: The patient has                            taken no previous anticoagulant or antiplatelet                            agents. ASA Grade Assessment: III - A patient with                            severe systemic disease. After reviewing the risks                            and benefits, the patient was deemed in                            satisfactory condition to undergo the procedure.                           After obtaining informed consent, the colonoscope  was passed under direct vision. Throughout the                            procedure, the patient's blood pressure, pulse, and                            oxygen saturations were monitored continuously. The                            Model PCF-H190DL [id number]) scope was introduced                            through the anus and advanced to the the cecum,   identified by appendiceal orifice and ileocecal                            valve. The colonoscopy was performed without                            difficulty. The patient tolerated the procedure                            well. The quality of the bowel preparation was                            adequate. The ileocecal valve, appendiceal orifice,                            and rectum were photographed. Scope In: 10:20:44 AM Scope Out: 10:49:29 AM Scope Withdrawal Time: 0 hours 22 minutes 11 seconds  Total Procedure Duration: 0 hours 28 minutes 45 seconds  Findings:                 The perianal and digital rectal examinations were                            normal.                           Three sessile polyps were found in the descending                            colon and ascending colon. The polyps were 1 to 3                            mm in size. These polyps were removed with a cold                            biopsy forceps. Resection and retrieval were                            complete.                           13 sessile polyps were found in the transverse  colon and ascending colon. The polyps were 4 to 11                            mm in size. These polyps were removed with a cold                            snare. Resection and retrieval were complete.                           Scattered small and large-mouthed diverticula were                            found in the sigmoid colon, descending colon,                            transverse colon and ascending colon. There was                            evidence of diverticular spasm. There was evidence                            of an impacted diverticulum.                           Non-bleeding internal hemorrhoids were found during                            retroflexion. The hemorrhoids were medium-sized. Complications:            No immediate complications. Estimated Blood Loss:     Estimated blood  loss was minimal. Impression:               - Three 1 to 3 mm polyps in the descending colon                            and in the ascending colon, removed with a cold                            biopsy forceps. Resected and retrieved.                           - 13 4 to 11 mm polyps in the transverse colon and                            in the ascending colon, removed with a cold snare.                            Resected and retrieved.                           - Moderate diverticulosis in the sigmoid colon, in                            the descending colon, in  the transverse colon and                            in the ascending colon. There was evidence of                            diverticular spasm. There was evidence of an                            impacted diverticulum.                           - Non-bleeding internal hemorrhoids. Recommendation:           - Patient has a contact number available for                            emergencies. The signs and symptoms of potential                            delayed complications were discussed with the                            patient. Return to normal activities tomorrow.                            Written discharge instructions were provided to the                            patient.                           - Resume previous diet.                           - Continue present medications.                           - No aspirin, ibuprofen, naproxen, or other                            non-steroidal anti-inflammatory drugs for 2 weeks.                           - Await pathology results.                           - Repeat colonoscopy in 1 year for surveillance of                            multiple polyps. Mauri Pole, MD 03/23/2018 11:19:31 AM This report has been signed electronically.

## 2018-03-23 NOTE — Op Note (Addendum)
[place] Patient Name: Luke [name] Procedure Date: 03/23/2018 10:12 AM MRN: [id number] Endoscopist: Mauri Pole , MD Age: 72 Referring MD:  Date of Birth: May 30, 1945 Gender: Male Account #: [id number] Procedure:                Upper GI endoscopy Indications:              Dysphagia, For therapy of esophageal stenosis Medicines:                Monitored Anesthesia Care Procedure:                Pre-Anesthesia Assessment:                           - Prior to the procedure, a History and Physical                            was performed, and patient medications and                            allergies were reviewed. The patient's tolerance of                            previous anesthesia was also reviewed. The risks                            and benefits of the procedure and the sedation                            options and risks were discussed with the patient.                            All questions were answered, and informed consent                            was obtained. Prior Anticoagulants: The patient has                            taken no previous anticoagulant or antiplatelet                            agents. ASA Grade Assessment: III - A patient with                            severe systemic disease. After reviewing the risks                            and benefits, the patient was deemed in                            satisfactory condition to undergo the procedure.                           After obtaining informed consent, the endoscope was  passed under direct vision. Throughout the                            procedure, the patient's blood pressure, pulse, and                            oxygen saturations were monitored continuously. The                            Endoscope was introduced through the mouth, and                            advanced to the second part of duodenum. The upper                            GI  endoscopy was technically difficult and complex                            due to stenosis. The patient tolerated the                            procedure well. Scope In: Scope Out: Findings:                 Two benign-appearing, intrinsic severe (stenosis;                            an endoscope cannot pass) stenoses were found 25 to                            26 cm from the incisors. The narrowest stenosis                            measured 8 mm (inner diameter) x 1 cm (in length).                            The stenoses were traversed after dilation. A TTS                            dilator was passed through the scope. Dilation with                            an 8.5-9.5-10.5 mm balloon and an 03-17-12 mm                            balloon dilator was performed to 13 mm. The                            dilation site was examined following endoscope                            reinsertion and showed mild mucosal disruption and  moderate improvement in luminal narrowing.                           Mucosal changes including ringed esophagus,                            small-caliber esophagus, congestion (edema), crepe                            paper esophagus and punctate white spots were found                            in the entire esophagus. Esophageal findings were                            graded using the Eosinophilic Esophagitis                            Endoscopic Reference Score (EoE-EREFS) as: Edema                            Grade 1 Present (decreased clarity or absence of                            vascular markings), Rings Grade 3 Severe (distinct                            rings that do not permit passage of diagnostic 8-10                            mm endoscope), Exudates Grade 1 Mild (scattered                            white lesions involving less than 10 percent of the                            esophageal surface area), Furrows Grade 0 None  (no                            vertical lines seen) and Stricture present (8 mm                            luminal diameter). Biopsies were obtained from the                            proximal and distal esophagus with cold forceps for                            histology of suspected eosinophilic esophagitis.                           Striped mildly erythematous mucosa without bleeding  was found in the gastric antrum.                           A large non-bleeding diverticulum was found in the                            second portion of the duodenum with bezoar. Complications:            No immediate complications. Estimated Blood Loss:     Estimated blood loss was minimal. Impression:               - Benign-appearing esophageal stenoses. Dilated.                           - Esophageal mucosal changes suspicious for                            eosinophilic esophagitis. Biopsied.                           - Erythematous mucosa in the antrum.                           - Non-bleeding duodenal diverticulum. Recommendation:           - Patient has a contact number available for                            emergencies. The signs and symptoms of potential                            delayed complications were discussed with the                            patient. Return to normal activities tomorrow.                            Written discharge instructions were provided to the                            patient.                           - Mechanical soft diet.                           - Continue present medications.                           - Follow an antireflux regimen indefinitely.                           - Use Nexium (esomeprazole) 40 mg PO BID X 3 months.                           - Return to my office in 2 months. Mauri Pole, MD 03/23/2018 11:18:20 AM This report has been  signed electronically.

## 2018-03-23 NOTE — Progress Notes (Signed)
Called to room to assist during endoscopic procedure.  Patient ID and intended procedure confirmed with present staff. Received instructions for my participation in the procedure from the performing physician.  

## 2018-03-24 ENCOUNTER — Telehealth: Payer: Self-pay

## 2018-03-24 NOTE — Telephone Encounter (Signed)
  Follow up Call-  Call back number 03/23/2018 01/29/2018  Post procedure Call Back phone  # [phone]  Permission to leave phone message Yes Yes  Some recent data might be hidden     Patient questions:  Do you have a fever, pain , or abdominal swelling? No. Pain Score  0 *  Have you tolerated food without any problems? Yes.    Have you been able to return to your normal activities? Yes.    Do you have any questions about your discharge instructions: Diet   No. Medications  No. Follow up visit  No.  Do you have questions or concerns about your Care? No.  Actions: * If pain score is 4 or above: No action needed, pain <4.

## 2018-03-25 ENCOUNTER — Ambulatory Visit (INDEPENDENT_AMBULATORY_CARE_PROVIDER_SITE_OTHER)
Admission: RE | Admit: 2018-03-25 | Discharge: 2018-03-25 | Disposition: A | Discharge status: Home / Self Care | Payer: Medicare Other | Source: Ambulatory Visit | Attending: Gastroenterology | Admitting: Gastroenterology

## 2018-03-25 DIAGNOSIS — K858 Other acute pancreatitis without necrosis or infection: Secondary | ICD-10-CM

## 2018-03-25 DIAGNOSIS — K859 Acute pancreatitis without necrosis or infection, unspecified: Secondary | ICD-10-CM | POA: Diagnosis not present

## 2018-03-25 MED ORDER — IOPAMIDOL (ISOVUE-300) INJECTION 61%
100.0000 mL | Freq: Once | INTRAVENOUS | Status: AC | PRN
  Administered 2018-03-25: 100 mL via INTRAVENOUS

## 2018-03-27 ENCOUNTER — Other Ambulatory Visit: Payer: Self-pay | Admitting: *Deleted

## 2018-03-27 ENCOUNTER — Encounter: Payer: Self-pay | Admitting: Gastroenterology

## 2018-03-27 NOTE — Patient Outreach (Signed)
[facility]) Care Management  03/27/2018  JAMAHL [name] 1945/09/30 [id number]   RN Health Coach Quarterly Follow Up  Referral Date:11/14/2017 Referral Source:Hospital Liaison Reason for Referral:Disease Management Education Insurance:Medicare   Outreach Attempt:  Outreach attempt #2 to patient for quarterly follow up. No answer. RN Health Coach left HIPAA compliant voicemail message along with contact information.  Plan:  RN health Coach will send Unsuccessful Outreach letter.  RN Health Coach will make another outreach attempt within the month of December,  Hubert Azure RN [place] [phone] [email or web address]

## 2018-04-01 ENCOUNTER — Telehealth: Payer: Self-pay | Admitting: Cardiology

## 2018-04-01 NOTE — Telephone Encounter (Signed)
Spoke w/ pt and requested that he send a manual transmission b/c his home monitor has not updated in at least 7 days.   

## 2018-04-14 ENCOUNTER — Telehealth: Payer: Self-pay | Admitting: Cardiology

## 2018-04-14 ENCOUNTER — Ambulatory Visit (INDEPENDENT_AMBULATORY_CARE_PROVIDER_SITE_OTHER): Payer: Medicare Other

## 2018-04-14 DIAGNOSIS — I5022 Chronic systolic (congestive) heart failure: Secondary | ICD-10-CM | POA: Diagnosis not present

## 2018-04-14 DIAGNOSIS — I255 Ischemic cardiomyopathy: Secondary | ICD-10-CM

## 2018-04-14 NOTE — Telephone Encounter (Signed)
Spoke with pt and reminded pt of remote transmission that is due today. Pt verbalized understanding.   

## 2018-04-15 NOTE — Progress Notes (Signed)
Remote ICD transmission.   

## 2018-04-27 ENCOUNTER — Ambulatory Visit: Payer: Self-pay | Admitting: *Deleted

## 2018-05-05 ENCOUNTER — Other Ambulatory Visit: Payer: Self-pay | Admitting: *Deleted

## 2018-05-05 NOTE — Patient Outreach (Signed)
[facility]) Care Management  05/05/2018  Luke [name] 1945/12/25 [id number]   RN Health CoachQuarterly Follow Up  Referral Date:11/14/2017 Referral Source:Hospital Liaison Reason for Referral:Disease Management Education Insurance:Medicare   Outreach Attempt:  Outreach attempt #3 to patient for quarterly follow up. No answer. RN Health Coach left HIPAA compliant voicemail message along with contact information.  Plan:  RN Health Coach will send patient Unsuccessful Outreach Letter.  RN Health Coach will attempt another outreach within the month of January.  [place] [phone] [email or web address]

## 2018-05-06 HISTORY — PX: UPPER GASTROINTESTINAL ENDOSCOPY: SHX188

## 2018-05-16 ENCOUNTER — Other Ambulatory Visit: Payer: Self-pay | Admitting: Family Medicine

## 2018-05-18 ENCOUNTER — Other Ambulatory Visit: Payer: Self-pay | Admitting: *Deleted

## 2018-05-18 NOTE — Patient Outreach (Signed)
[facility]) Care Management  05/18/2018  AUDRICK LAMOUREAUX 02/25/1946 [id number]   RN Health CoachQuarterly Follow Up  Referral Date:11/14/2017 Referral Source:Hospital Liaison Reason for Referral:Disease Management Education Insurance:Medicare   Outreach Attempt:  Received voicemail message from patient after last unsuccessful outreach attempt.  Outreach attempt #4 to patient for quarterly follow up. No answer. RN Health Coach left HIPAA compliant voicemail message along with contact information.  Plan:  RN Health Coach will make final outreach to patient within the month of February if no return call back from patient.  [place] [phone] [email or web address]

## 2018-05-30 LAB — CUP PACEART REMOTE DEVICE CHECK
Battery Remaining Longevity: 26 mo
Battery Remaining Percentage: 30 %
Battery Voltage: 2.87 V
Brady Statistic AP VP Percent: 14 %
Brady Statistic AP VS Percent: 1 %
Brady Statistic AS VP Percent: 86 %
Brady Statistic AS VS Percent: 1 %
Brady Statistic RA Percent Paced: 14 %
Date Time Interrogation Session: 20191210175440
HighPow Impedance: 75 Ohm
HighPow Impedance: 75 Ohm
Implantable Lead Implant Date: 20140313
Implantable Lead Implant Date: 20140313
Implantable Lead Implant Date: 20140313
Implantable Lead Location: 753858
Implantable Lead Location: 753859
Implantable Lead Location: 753860
Implantable Pulse Generator Implant Date: 20140313
Lead Channel Impedance Value: 430 Ohm
Lead Channel Impedance Value: 460 Ohm
Lead Channel Impedance Value: 900 Ohm
Lead Channel Pacing Threshold Amplitude: 0.75 V
Lead Channel Pacing Threshold Amplitude: 0.875 V
Lead Channel Pacing Threshold Amplitude: 1 V
Lead Channel Pacing Threshold Pulse Width: 0.5 ms
Lead Channel Pacing Threshold Pulse Width: 0.5 ms
Lead Channel Pacing Threshold Pulse Width: 0.6 ms
Lead Channel Sensing Intrinsic Amplitude: 11.7 mV
Lead Channel Sensing Intrinsic Amplitude: 3.9 mV
Lead Channel Setting Pacing Amplitude: 2 V
Lead Channel Setting Pacing Amplitude: 2 V
Lead Channel Setting Pacing Amplitude: 2.5 V
Lead Channel Setting Pacing Pulse Width: 0.5 ms
Lead Channel Setting Pacing Pulse Width: 0.6 ms
Lead Channel Setting Sensing Sensitivity: 0.5 mV
Pulse Gen Serial Number: 7097007

## 2018-06-05 ENCOUNTER — Encounter: Payer: Self-pay | Admitting: Gastroenterology

## 2018-06-05 ENCOUNTER — Ambulatory Visit (INDEPENDENT_AMBULATORY_CARE_PROVIDER_SITE_OTHER): Payer: Medicare Other | Admitting: Gastroenterology

## 2018-06-05 VITALS — BP 150/70 | HR 82 | Ht 74.0 in | Wt 194.0 lb

## 2018-06-05 DIAGNOSIS — K2100 Gastro-esophageal reflux disease with esophagitis, without bleeding: Secondary | ICD-10-CM

## 2018-06-05 DIAGNOSIS — K861 Other chronic pancreatitis: Secondary | ICD-10-CM

## 2018-06-05 DIAGNOSIS — K222 Esophageal obstruction: Secondary | ICD-10-CM | POA: Diagnosis not present

## 2018-06-05 DIAGNOSIS — Z86010 Personal history of colonic polyps: Secondary | ICD-10-CM | POA: Diagnosis not present

## 2018-06-05 DIAGNOSIS — R1310 Dysphagia, unspecified: Secondary | ICD-10-CM

## 2018-06-05 MED ORDER — OMEPRAZOLE 40 MG PO CPDR: 40.0000 mg | DELAYED_RELEASE_CAPSULE | Freq: Two times a day (BID) | ORAL | 3 refills | Status: DC

## 2018-06-05 NOTE — Progress Notes (Signed)
Luke [name]    [id number]    09/18/1945  Primary Care Physician:Duncan, Elveria Rising, MD  Referring Physician: Tonia Ghent, MD [place], [place] [address]  Chief complaint: Dysphagia  HPI: 73 year old male with history of diabetes, pancreatitis, GERD with proximal esophageal stricture here for follow-up visit. EGD with proximal esophageal stricture dilated to 13 mm with TTS balloon.  Esophageal biopsies negative for eosinophilic esophagitis. Colonoscopy with removal of 16 sessile adenomatous polyps. He was hospitalized in July 2019 with acute pancreatitis, idiopathic.  Possible secondary to medication, ?  Ace inhibitor. Repeat CT abdomen pelvis November 2019 negative for any mass lesion or IPMN.  Showed small volume peripancreatic fluid with resolution of acute pancreatitis.  After esophageal dilation, he is able to tolerate better p.o. intake but continues to have difficulty if he tries to eat fast and also especially with meat and raw vegetables.  He is gaining weight, has gained 5 pounds in the past few months.   Outpatient Encounter Medications as of 06/05/2018  Medication Sig  . amLODipine (NORVASC) 10 MG tablet Take 1 tablet (10 mg total) by mouth daily.  Marland Kitchen aspirin EC 81 MG tablet Take 81 mg by mouth daily.  . carvedilol (COREG) 25 MG tablet TAKE 1 TABLET BY MOUTH TWICE DAILY WITH MEALS (Patient taking differently: Take 25 mg by mouth 2 (two) times daily with a meal. )  . Cholecalciferol (VITAMIN D PO) Take 1,200 Units by mouth daily.   Marland Kitchen glipiZIDE (GLUCOTROL) 5 MG tablet TAKE 2 TABLETS(10 MG) BY MOUTH TWICE DAILY BEFORE A MEAL  . glucose blood (ACCU-CHEK AVIVA PLUS) test strip CHECK BLOOD SUGAR TWICE A DAY AND AS DIRECTED. DX. E11.59  . hydrALAZINE (APRESOLINE) 25 MG tablet TAKE 1 TABLET(25 MG) BY MOUTH THREE TIMES DAILY  . Insulin Glargine (LANTUS SOLOSTAR) 100 UNIT/ML Solostar Pen Inject 25 Units into the skin daily at 10 pm.  . Insulin  Pen Needle (PENTIPS) 31G X 5 MM MISC Use daily with insulin pen  . metFORMIN (GLUCOPHAGE) 500 MG tablet TAKE 2 TABLETS BY MOUTH TWICE DAILY WITH FOOD  . Multiple Vitamin (MULTIVITAMIN) tablet Take 1 tablet by mouth daily.   . sildenafil (REVATIO) 20 MG tablet TAKE THREE TO FOUR TABLETS BY MOUTH DAILY AS NEEDED FOR ED  . simvastatin (ZOCOR) 40 MG tablet Take 1 tablet (40 mg total) by mouth at bedtime.  . vitamin B-12 (CYANOCOBALAMIN) 500 MCG tablet Take 500 mcg by mouth daily.  Marland Kitchen omeprazole (PRILOSEC) 40 MG capsule Take 1 capsule (40 mg total) by mouth 2 (two) times daily before a meal.  . [DISCONTINUED] esomeprazole (NEXIUM) 40 MG capsule Take 1 capsule (40 mg total) by mouth 2 (two) times daily before a meal.  . [DISCONTINUED] sucralfate (CARAFATE) 1 g tablet Take 1 tablet (1 g total) by mouth 4 (four) times daily -  with meals and at bedtime. Make into slurry as directed   No facility-administered encounter medications on file as of 06/05/2018.     Allergies as of 06/05/2018 - Review Complete 06/05/2018  Allergen Reaction Noted  . Aldactone [spironolactone] Other (See Comments) 04/20/2013  . Codeine Rash and Hives   . Januvia [sitagliptin] Other (See Comments) 11/21/2016  . Lisinopril  11/18/2017    Past Medical History:  Diagnosis Date  . AAA (abdominal aortic aneurysm) ([place]) 2011   Per vascular surgery  . Anxiety    no med. in use for anxiety  but pt. speaks openly of his stress & anxious feelings regarding impending surgery    . Atrial fibrillation ([place])   . Automatic implantable cardioverter-defibrillator in situ   . CAD (coronary artery disease)    Presumed CAD with nuclear scan October 09, 2011,  large anteroseptal MI and inferior MI. Catheterization scheduled October 15, 2011  . Cardiomyopathy [facility])    Nuclear, October 09, 2011, EF 30%, multiple focal wall motion abnormalities  . Diabetes mellitus    type II  . Drug therapy    Hyperkalemia with spironolactone  . Ejection fraction <  50%    EF 30%, nuclear, October 09, 2011  . Ejection fraction < 50%    EF previously 30%  //   EF 30-35%, echo, July 14, 2012, severe diffuse hypokinesis, PA pressure 43 mm mercury  . Fall due to ice or snow Feb.  19, 2015  . HLD (hyperlipidemia)   . Hypertension    white coat HTN-- often elevated in office and controlled on outside checks.  . ICD (implantable cardioverter-defibrillator) in place    CRT-D placed March, 2014 complete heart block and k dysfunction  . LBBB (left bundle branch block)    LBBB on EKG October 11, 2011,  no prior EKG has been done  . Low testosterone    Hx of  . Myocardial infarction ([place])   . Pacemaker   . PAD (peripheral artery disease) ([place])   . Pancreatitis     Past Surgical History:  Procedure Laterality Date  . ABDOMINAL AORTAGRAM N/A 10/28/2011   Procedure: ABDOMINAL [name];  Surgeon: Angelia Mould, MD;  Location: [facility] CATH LAB;  Service: Cardiovascular;  Laterality: N/A;  . ABDOMINAL AORTIC ANEURYSM REPAIR     EVAR   . BI-VENTRICULAR IMPLANTABLE CARDIOVERTER DEFIBRILLATOR N/A 07/16/2012   Procedure: BI-VENTRICULAR IMPLANTABLE CARDIOVERTER DEFIBRILLATOR  (CRT-D);  Surgeon: Deboraha Sprang, MD;  Location[facility] C[facility] LAB;  Service: Cardiovascular;  Laterality: N/A;  . CARDIAC CATHETERIZATION    . COLONOSCOPY    . ESOPHAGOGASTRODUODENOSCOPY (EGD) WITH PROPOFOL N/A 02/05/2018   Procedure: ESOPHAGOGASTRODUODENOSCOPY (EGD) WITH PROPOFOL;  Surgeon: Mauri Pole, MD;  Location: [facility] ENDOSCOPY;  Service: Endoscopy;  Laterality: N/A;  . HERNIA REPAIR  Jan. 9, 2015  . INSERTION OF MESH N/A 05/14/2013   Procedure: INSERTION OF MESH;  Surgeon: Adin Hector, MD;  Location: [place];  Service: General;  Laterality: N/A;  . LOWER EXTREMITY ANGIOGRAM Bilateral 10/28/2011   Procedure: LOWER EXTREMITY ANGIOGRAM;  Surgeon: Angelia Mould, MD;  Location: [facility] CATH LAB;  Service: Cardiovascular;  Laterality: Bilateral;  . PACEMAKER INSERTION  07-16-12    pacemaker/defibrilator  . POSTERIOR CERVICAL FUSION/FORAMINOTOMY N/A 06/09/2014   Procedure: Laminectomy - Cervical two-Cervcial four posterior cervical instrumented fusion Cervical two-cervical four;  Surgeon: Eustace Moore, MD;  Location: [facility] NEURO ORS;  Service: Neurosurgery;  Laterality: N/A;  posterior   . SAVORY DILATION N/A 02/05/2018   Procedure: SAVORY DILATION;  Surgeon: Mauri Pole, MD;  Location: [facility] ENDOSCOPY;  Service: Endoscopy;  Laterality: N/A;  . TONSILLECTOMY     as a child   . UMBILICAL HERNIA REPAIR N/A 05/14/2013   Procedure: LAPAROSCOPIC exploration and repair of hernia in abdominal ;  Surgeon: Adin Hector, MD;  Location: [facility] OR;  Service: General;  Laterality: N/A;    Family History  Problem Relation Age of Onset  . Hypertension Mother   . Stroke Mother   . Hyperlipidemia Mother   . Diabetes Sister   .  Heart disease Sister        Before age 25  . Hypertension Sister   . Hyperlipidemia Sister   . Heart attack Sister   . Cancer Father        Lung  . Hypertension Son   . Colon cancer Neg Hx   . Prostate cancer Neg Hx     Social History   Socioeconomic History  . Marital status: Married    Spouse name: Precious Bard  . Number of children: 1  . Years of education: Not on file  . Highest education level: Not on file  Occupational History  . Occupation: retired  Scientific laboratory technician  . Financial resource strain: Not on file  . Food insecurity:    Worry: Not on file    Inability: Not on file  . Transportation needs:    Medical: Not on file    Non-medical: Not on file  Tobacco Use  . Smoking status: Former Smoker    Packs/day: 2.00    Years: 40.00    Pack years: 80.00    Types: Cigarettes    Last attempt to quit: 05/06/2004    Years since quitting: 14.0  . Smokeless tobacco: Never Used  Substance and Sexual Activity  . Alcohol use: Yes    Alcohol/week: 0.0 - 2.0 standard drinks    Comment: beer or wine  occassionally  . Drug use: No  . Sexual activity:  Yes    Partners: Female  Lifestyle  . Physical activity:    Days per week: Not on file    Minutes per session: Not on file  . Stress: Not on file  Relationships  . Social connections:    Talks on phone: Not on file    Gets together: Not on file    Attends religious service: Not on file    Active member of club or organization: Not on file    Attends meetings of clubs or organizations: Not on file    Relationship status: Not on file  . Intimate partner violence:    Fear of current or ex partner: Not on file    Emotionally abused: Not on file    Physically abused: Not on file    Forced sexual activity: Not on file  Other Topics Concern  . 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        Review of systems: Review of Systems  Constitutional: Negative for fever and chills.  HENT: Negative.   Eyes: Negative for blurred vision.  Respiratory: Negative for cough, shortness of breath and wheezing.   Cardiovascular: Negative for chest pain and palpitations.  Gastrointestinal: as per HPI Genitourinary: Negative for dysuria, urgency, frequency and hematuria.  Musculoskeletal: Negative for myalgias, back pain and joint pain.  Skin: Negative for itching and rash.  Neurological: Negative for dizziness, tremors, focal weakness, seizures and loss of consciousness.  Endo/Heme/Allergies: Negative for seasonal allergies.  Psychiatric/Behavioral: Negative for depression, suicidal ideas and hallucinations.  All other systems reviewed and are negative.   Physical Exam: Vitals:   06/05/18 1001  BP: (!) 150/70  Pulse: 82   Body mass index is 24.91 kg/m. Gen:      No acute distress HEENT:  EOMI, sclera anicteric Neck:     No masses; no thyromegaly Lungs:    Clear to auscultation bilaterally; normal respiratory effort CV:         Regular rate and rhythm; no murmurs Abd:      +  bowel sounds; soft, non-tender; no palpable masses, no  distension Ext:    No edema; adequate peripheral perfusion Skin:      Warm and dry; no rash Neuro: alert and oriented x 3 Psych: normal mood and affect  Data Reviewed:  Reviewed labs, radiology imaging, old records and pertinent past GI work up   Assessment and Plan/Recommendations:  73 year old male with history of CAD, diabetes, hypertension, acute idiopathic pancreatitis July 2019, GERD and proximal esophageal stricture Status post dilation of proximal esophageal stricture to 60mm Continues to have solid food dysphagia especially with meat and raw vegetables/salads We will schedule for repeat EGD with serial dilation of the proximal esophageal stricture Continue omeprazole 40 mg twice daily Discussed antireflux measures and lifestyle modifications in detail  Acute idiopathic pancreatitis in July 2019: Unclear etiology CT November 2019 showed resolution of pancreatitis with atrophy of pancreas and small amount of fluid surrounding the pancreatic head and neck.  Will consider repeat CT pancreatic protocol in 1 year to exclude any underlying neoplastic lesion  Surveillance colonoscopy due November 2020.  Multiple adenomatous polyps>10 removed in November 2019.  Briefly discussed genetic counseling and testing.  Patient will think about it.  The risks and benefits as well as alternatives of endoscopic procedure(s) have been discussed and reviewed. All questions answered. The patient agrees to proceed.   Damaris Hippo , MD [phone]    CC: Tonia Ghent, MD

## 2018-06-05 NOTE — Patient Instructions (Signed)
You have been scheduled for an endoscopy. Please follow written instructions given to you at your visit today. If you use inhalers (even only as needed), please bring them with you on the day of your procedure.   Continue Omeprazole    Gastroesophageal Reflux Disease, Adult Gastroesophageal reflux (GER) happens when acid from the stomach flows up into the tube that connects the mouth and the stomach (esophagus). Normally, food travels down the esophagus and stays in the stomach to be digested. However, when a person has GER, food and stomach acid sometimes move back up into the esophagus. If this becomes a more serious problem, the person may be diagnosed with a disease called gastroesophageal reflux disease (GERD). GERD occurs when the reflux:  Happens often.  Causes frequent or severe symptoms.  Causes problems such as damage to the esophagus. When stomach acid comes in contact with the esophagus, the acid may cause soreness (inflammation) in the esophagus. Over time, GERD may create small holes (ulcers) in the lining of the esophagus. What are the causes? This condition is caused by a problem with the muscle between the esophagus and the stomach (lower esophageal sphincter, or LES). Normally, the LES muscle closes after food passes through the esophagus to the stomach. When the LES is weakened or abnormal, it does not close properly, and that allows food and stomach acid to go back up into the esophagus. The LES can be weakened by certain dietary substances, medicines, and medical conditions, including:  Tobacco use.  Pregnancy.  Having a hiatal hernia.  Alcohol use.  Certain foods and beverages, such as coffee, chocolate, onions, and peppermint. What increases the risk? You are more likely to develop this condition if you:  Have an increased body weight.  Have a connective tissue disorder.  Use NSAID medicines. What are the signs or symptoms? Symptoms of this condition  include:  Heartburn.  Difficult or painful swallowing.  The feeling of having a lump in the throat.  Abitter taste in the mouth.  Bad breath.  Having a large amount of saliva.  Having an upset or bloated stomach.  Belching.  Chest pain. Different conditions can cause chest pain. Make sure you see your health care provider if you experience chest pain.  Shortness of breath or wheezing.  Ongoing (chronic) cough or a night-time cough.  Wearing away of tooth enamel.  Weight loss. How is this diagnosed? Your health care provider will take a medical history and perform a physical exam. To determine if you have mild or severe GERD, your health care provider may also monitor how you respond to treatment. You may also have tests, including:  A test to examine your stomach and esophagus with a small camera (endoscopy).  A test thatmeasures the acidity level in your esophagus.  A test thatmeasures how much pressure is on your esophagus.  A barium swallow or modified barium swallow test to show the shape, size, and functioning of your esophagus. How is this treated? The goal of treatment is to help relieve your symptoms and to prevent complications. Treatment for this condition may vary depending on how severe your symptoms are. Your health care provider may recommend:  Changes to your diet.  Medicine.  Surgery. Follow these instructions at home: Eating and drinking   Follow a diet as recommended by your health care provider. This may involve avoiding foods and drinks such as: ? Coffee and tea (with or without caffeine). ? Drinks that containalcohol. ? Energy drinks and sports  drinks. ? Carbonated drinks or sodas. ? Chocolate and cocoa. ? Peppermint and mint flavorings. ? Garlic and onions. ? Horseradish. ? Spicy and acidic foods, including peppers, chili powder, curry powder, vinegar, hot sauces, and barbecue sauce. ? Citrus fruit juices and citrus fruits, such as  oranges, lemons, and limes. ? Tomato-based foods, such as red sauce, chili, salsa, and pizza with red sauce. ? Fried and fatty foods, such as donuts, french fries, potato chips, and high-fat dressings. ? High-fat meats, such as hot dogs and fatty cuts of red and white meats, such as rib eye steak, sausage, ham, and bacon. ? High-fat dairy items, such as whole milk, butter, and cream cheese.  Eat small, frequent meals instead of large meals.  Avoid drinking large amounts of liquid with your meals.  Avoid eating meals during the 2-3 hours before bedtime.  Avoid lying down right after you eat.  Do not exercise right after you eat. Lifestyle   Do not use any products that contain nicotine or tobacco, such as cigarettes, e-cigarettes, and chewing tobacco. If you need help quitting, ask your health care provider.  Try to reduce your stress by using methods such as yoga or meditation. If you need help reducing stress, ask your health care provider.  If you are overweight, reduce your weight to an amount that is healthy for you. Ask your health care provider for guidance about a safe weight loss goal. General instructions  Pay attention to any changes in your symptoms.  Take over-the-counter and prescription medicines only as told by your health care provider. Do not take aspirin, ibuprofen, or other NSAIDs unless your health care provider told you to do so.  Wear loose-fitting clothing. Do not wear anything tight around your waist that causes pressure on your abdomen.  Raise (elevate) the head of your bed about 6 inches (15 cm).  Avoid bending over if this makes your symptoms worse.  Keep all follow-up visits as told by your health care provider. This is important. Contact a health care provider if:  You have: ? New symptoms. ? Unexplained weight loss. ? Difficulty swallowing or it hurts to swallow. ? Wheezing or a persistent cough. ? A hoarse voice.  Your symptoms do not  improve with treatment. Get help right away if you:  Have pain in your arms, neck, jaw, teeth, or back.  Feel sweaty, dizzy, or light-headed.  Have chest pain or shortness of breath.  Vomit and your vomit looks like blood or coffee grounds.  Faint.  Have stool that is bloody or black.  Cannot swallow, drink, or eat. Summary  Gastroesophageal reflux happens when acid from the stomach flows up into the esophagus. GERD is a disease in which the reflux happens often, causes frequent or severe symptoms, or causes problems such as damage to the esophagus.  Treatment for this condition may vary depending on how severe your symptoms are. Your health care provider may recommend diet and lifestyle changes, medicine, or surgery.  Contact a health care provider if you have new or worsening symptoms.  Take over-the-counter and prescription medicines only as told by your health care provider. Do not take aspirin, ibuprofen, or other NSAIDs unless your health care provider told you to do so.  Keep all follow-up visits as told by your health care provider. This is important. This information is not intended to replace advice given to you by your health care provider. Make sure you discuss any questions you have with your health care  provider. Document Released: 01/30/2005 Document Revised: 10/29/2017 Document Reviewed: 10/29/2017 Elsevier Interactive Patient Education  [employer].  If you are age 6 or older, your body mass index should be between 23-30. Your Body mass index is 24.91 kg/m. If this is out of the aforementioned range listed, please consider follow up with your Primary Care Provider.  If you are age 50 or younger, your body mass index should be between 19-25. Your Body mass index is 24.91 kg/m. If this is out of the aformentioned range listed, please consider follow up with your Primary Care Provider.    I appreciate the  opportunity to care for you  Thank You    Harl Bowie , MD

## 2018-06-08 ENCOUNTER — Other Ambulatory Visit: Payer: Self-pay | Admitting: *Deleted

## 2018-06-08 NOTE — Patient Outreach (Signed)
[facility]) Care Management  06/08/2018  Luke [name] 1946-02-27 [id number]   RN Health CoachQuarterly Follow Up  Referral Date:11/14/2017 Referral Source:Hospital Liaison Reason for Referral:Disease Management Education Insurance:Medicare   Outreach Attempt:  Outreach attempt #5 to patient for returned patient message and quarterly follow up. No answer. RN Health Coach left HIPAA compliant voicemail message along with contact information.  Plan:  RN Health Coach will send Unsuccessful Letter.  RN Health Coach will close case if no return call back from patient in the next 10 business days.  [place] [phone] Luke [name]

## 2018-06-09 ENCOUNTER — Other Ambulatory Visit: Payer: Self-pay | Admitting: Family Medicine

## 2018-06-15 ENCOUNTER — Ambulatory Visit (INDEPENDENT_AMBULATORY_CARE_PROVIDER_SITE_OTHER): Payer: Medicare Other | Admitting: Internal Medicine

## 2018-06-15 ENCOUNTER — Encounter: Payer: Self-pay | Admitting: Internal Medicine

## 2018-06-15 VITALS — BP 152/80 | HR 70 | Ht 74.0 in | Wt 195.0 lb

## 2018-06-15 DIAGNOSIS — I5022 Chronic systolic (congestive) heart failure: Secondary | ICD-10-CM

## 2018-06-15 DIAGNOSIS — Z95810 Presence of automatic (implantable) cardiac defibrillator: Secondary | ICD-10-CM | POA: Diagnosis not present

## 2018-06-15 DIAGNOSIS — I255 Ischemic cardiomyopathy: Secondary | ICD-10-CM | POA: Diagnosis not present

## 2018-06-15 DIAGNOSIS — I447 Left bundle-branch block, unspecified: Secondary | ICD-10-CM | POA: Diagnosis not present

## 2018-06-15 DIAGNOSIS — I519 Heart disease, unspecified: Secondary | ICD-10-CM | POA: Diagnosis not present

## 2018-06-15 NOTE — Patient Instructions (Signed)

## 2018-06-15 NOTE — Progress Notes (Signed)
Patient Care Team: Tonia Ghent, MD as PCP - General Angelia Mould, MD (Vascular Surgery) Arta Silence, MD (Gastroenterology) Michael Boston, MD (General Surgery) Eustace Moore, MD (Neurosurgery) Idolina Primer Warnell Bureau (Optometry) Jerline Pain, MD (Cardiology) Leona Singleton, RN as [place] Management   HPI  Luke [name] is a 73 y.o. male Seen in followup for an CRT-  ICD implanted for intermittent complete heart block in the setting of ischemic cardiomyopathy and left bundle branch block.  LHC 2013  2V CAD Left anterior descending (LAD): focal 70-80% proximal. Long 40% mid vessel. Right coronary artery (RCA): Diffusely diseased. 50-60% mid vessel, 70% distal, 70% at bifurcation of PDA/PLOM.   DATE TEST EF   6/13 LHC  pLAD70-80; RCA diff 50-60%  3/14 Echo   30-35 %   3/19 Echo  55-65%      The patient denies chest pain, shortness of breath, nocturnal dyspnea, orthopnea or peripheral edema.  There have been no palpitations, lightheadedness or syncope.   Patient developed pancreatitis.  Was hospitalized.  Attributed to ACE inhibitor therapy discontinued and started on hydralazine.  Past Medical History:  Diagnosis Date  . AAA (abdominal aortic aneurysm) ([place]) 2011   Per vascular surgery  . Anxiety    no med. in use for anxiety but pt. speaks openly of his stress & anxious feelings regarding impending surgery    . Atrial fibrillation ([place])   . Automatic implantable cardioverter-defibrillator in situ   . CAD (coronary artery disease)    Presumed CAD with nuclear scan October 09, 2011,  large anteroseptal MI and inferior MI. Catheterization scheduled October 15, 2011  . Cardiomyopathy [facility])    Nuclear, October 09, 2011, EF 30%, multiple focal wall motion abnormalities  . Diabetes mellitus    type II  . Drug therapy    Hyperkalemia with spironolactone  . Ejection fraction < 50%    EF 30%, nuclear, October 09, 2011  . Ejection fraction < 50%    EF previously 30%  //   EF 30-35%, echo, July 14, 2012, severe diffuse hypokinesis, PA pressure 43 mm mercury  . Fall due to ice or snow Feb.  19, 2015  . HLD (hyperlipidemia)   . Hypertension    white coat HTN-- often elevated in office and controlled on outside checks.  . ICD (implantable cardioverter-defibrillator) in place    CRT-D placed March, 2014 complete heart block and k dysfunction  . LBBB (left bundle branch block)    LBBB on EKG October 11, 2011,  no prior EKG has been done  . Low testosterone    Hx of  . Myocardial infarction ([place])   . Pacemaker   . PAD (peripheral artery disease) ([place])   . Pancreatitis     Past Surgical History:  Procedure Laterality Date  . ABDOMINAL AORTAGRAM N/A 10/28/2011   Procedure: ABDOMINAL [name];  Surgeon: Angelia Mould, MD;  Location: [facility] CATH LAB;  Service: Cardiovascular;  Laterality: N/A;  . ABDOMINAL AORTIC ANEURYSM REPAIR     EVAR   . BI-VENTRICULAR IMPLANTABLE CARDIOVERTER DEFIBRILLATOR N/A 07/16/2012   Procedure: BI-VENTRICULAR IMPLANTABLE CARDIOVERTER DEFIBRILLATOR  (CRT-D);  Surgeon: Deboraha Sprang, MD;  Location: [facility] CATH LAB;  Service: Cardiovascular;  Laterality: N/A;  . CARDIAC CATHETERIZATION    . COLONOSCOPY    . ESOPHAGOGASTRODUODENOSCOPY (EGD) WITH PROPOFOL N/A 02/05/2018   Procedure: ESOPHAGOGASTRODUODENOSCOPY (EGD) WITH PROPOFOL;  Surgeon: Mauri Pole, MD;  Location: [facility] ENDOSCOPY;  Service: Endoscopy;  Laterality: N/A;  . HERNIA REPAIR  Jan. 9, 2015  . INSERTION OF MESH N/A 05/14/2013   Procedure: INSERTION OF MESH;  Surgeon: Adin Hector, MD;  Location: [facility];  Service: General;  Laterality: N/A;  . LOWER EXTREMITY ANGIOGRAM Bilateral 10/28/2011   Procedure: LOWER EXTREMITY ANGIOGRAM;  Surgeon: Angelia Mould, MD;  Location: [facility] CATH LAB;  Service: Cardiovascular;  Laterality: Bilateral;  . PACEMAKER INSERTION  07-16-12   pacemaker/defibrilator  . POSTERIOR CERVICAL FUSION/FORAMINOTOMY N/A 06/09/2014    Procedure: Laminectomy - Cervical two-Cervcial four posterior cervical instrumented fusion Cervical two-cervical four;  Surgeon: Eustace Moore, MD;  Location: [place] NEURO ORS;  Service: Neurosurgery;  Laterality: N/A;  posterior   . SAVORY DILATION N/A 02/05/2018   Procedure: SAVORY DILATION;  Surgeon: Mauri Pole, MD;  Location: [facility] ENDOSCOPY;  Service: Endoscopy;  Laterality: N/A;  . TONSILLECTOMY     as a child   . UMBILICAL HERNIA REPAIR N/A 05/14/2013   Procedure: LAPAROSCOPIC exploration and repair of hernia in abdominal ;  Surgeon: Adin Hector, MD;  Location: [place] OR;  Service: General;  Laterality: N/A;    Current Outpatient Medications  Medication Sig Dispense Refill  . amLODipine (NORVASC) 10 MG tablet Take 1 tablet (10 mg total) by mouth daily. 90 tablet 3  . aspirin EC 81 MG tablet Take 81 mg by mouth daily.    . carvedilol (COREG) 25 MG tablet TAKE 1 TABLET BY MOUTH TWICE DAILY WITH MEALS 180 tablet 2  . Cholecalciferol (VITAMIN D PO) Take 1,200 Units by mouth daily.     Marland Kitchen glipiZIDE (GLUCOTROL) 5 MG tablet TAKE 2 TABLETS(10 MG) BY MOUTH TWICE DAILY BEFORE A MEAL 360 tablet 1  . glucose blood test strip USE TO TEST BLOOD SUGAR TWICE DAILY& AS DIRECTED 100 each 5  . hydrALAZINE (APRESOLINE) 25 MG tablet TAKE 1 TABLET(25 MG) BY MOUTH THREE TIMES DAILY 90 tablet 2  . Insulin Glargine (LANTUS SOLOSTAR) 100 UNIT/ML Solostar Pen Inject 25 Units into the skin daily at 10 pm.    . Insulin Pen Needle (PENTIPS) 31G X 5 MM MISC Use daily with insulin pen 100 each 3  . metFORMIN (GLUCOPHAGE) 500 MG tablet TAKE 2 TABLETS BY MOUTH TWICE DAILY WITH FOOD 360 tablet 1  . Multiple Vitamin (MULTIVITAMIN) tablet Take 1 tablet by mouth daily.     Marland Kitchen omeprazole (PRILOSEC) 40 MG capsule Take 1 capsule (40 mg total) by mouth 2 (two) times daily before a meal. 60 capsule 3  . sildenafil (REVATIO) 20 MG tablet TAKE THREE TO FOUR TABLETS BY MOUTH DAILY AS NEEDED FOR ED    . simvastatin (ZOCOR) 40 MG  tablet Take 1 tablet (40 mg total) by mouth at bedtime.    . vitamin B-12 (CYANOCOBALAMIN) 500 MCG tablet Take 500 mcg by mouth daily.     No current facility-administered medications for this visit.     Allergies  Allergen Reactions  . Aldactone [Spironolactone] Other (See Comments)    Hyperkalemia  . Codeine Rash and Hives  . Januvia [Sitagliptin] Other (See Comments)    Diarrhea and heart racing  . Lisinopril     Possible cause of pancreatitis    Review of Systems negative except from HPI and PMH  Physical Exam BP (!) 152/80   Pulse 70   Ht 6\' 2"  (1.88 m)   Wt 195 lb (88.5 kg)   SpO2 90%   BMI 25.04 kg/m  Well developed and well  nourished in no acute distress HENT normal Neck supple with JVP-flat Clear Device pocket well healed; without hematoma or erythema.  There is no tethering  Regular rate and rhythm, no murmur Abd-soft with active BS No Clubbing cyanosis  edema Skin-warm and dry A & Oriented  Grossly normal sensory and motor function    ECG demonstrates sinus w P-synchronous/ AV  pacing with upright QRS V1 and neg Lead 1    Assessment and  Plan  Ischemic Cardiomyopathy    Chronic HF systolic    Implantable Defibrillator CRT  St Jude  The patient's device was interrogated.  The information was reviewed. No changes were made in the programming.    Complete heart Block   Hyperlipidemia  Hypertension     Blood pressure remains elevated.  He developed pancreatitis on lisinopril.  According to Micromedex, 0.1%.  There is a risk at rechallenge.  No information that I can find and so I have discussed with pharmacy about cross-reactivity with ARB therapy.  Would like to continue based on recent data showing maintaining of LV function following recovery is associated with maintaining of neurohormonal blockade.

## 2018-06-16 LAB — CUP PACEART INCLINIC DEVICE CHECK
Battery Remaining Longevity: 26 mo
Brady Statistic RA Percent Paced: 13 %
Brady Statistic RV Percent Paced: 99.99 %
Date Time Interrogation Session: 20200210202220
HighPow Impedance: 79.875
Implantable Lead Implant Date: 20140313
Implantable Lead Implant Date: 20140313
Implantable Lead Implant Date: 20140313
Implantable Lead Location: 753858
Implantable Lead Location: 753859
Implantable Lead Location: 753860
Implantable Pulse Generator Implant Date: 20140313
Lead Channel Impedance Value: 425 Ohm
Lead Channel Impedance Value: 462.5 Ohm
Lead Channel Impedance Value: 912.5 Ohm
Lead Channel Pacing Threshold Amplitude: 0.75 V
Lead Channel Pacing Threshold Amplitude: 0.75 V
Lead Channel Pacing Threshold Amplitude: 0.875 V
Lead Channel Pacing Threshold Amplitude: 1 V
Lead Channel Pacing Threshold Amplitude: 1 V
Lead Channel Pacing Threshold Pulse Width: 0.5 ms
Lead Channel Pacing Threshold Pulse Width: 0.5 ms
Lead Channel Pacing Threshold Pulse Width: 0.5 ms
Lead Channel Pacing Threshold Pulse Width: 0.6 ms
Lead Channel Pacing Threshold Pulse Width: 0.6 ms
Lead Channel Sensing Intrinsic Amplitude: 11.9 mV
Lead Channel Sensing Intrinsic Amplitude: 3.3 mV
Lead Channel Setting Pacing Amplitude: 2 V
Lead Channel Setting Pacing Amplitude: 2 V
Lead Channel Setting Pacing Amplitude: 2.5 V
Lead Channel Setting Pacing Pulse Width: 0.5 ms
Lead Channel Setting Pacing Pulse Width: 0.6 ms
Lead Channel Setting Sensing Sensitivity: 0.5 mV
Pulse Gen Serial Number: 7097007

## 2018-06-17 ENCOUNTER — Ambulatory Visit (AMBULATORY_SURGERY_CENTER): Payer: Medicare Other | Admitting: Gastroenterology

## 2018-06-17 ENCOUNTER — Encounter: Payer: Self-pay | Admitting: Gastroenterology

## 2018-06-17 VITALS — BP 125/72 | HR 64 | Temp 97.7°F | Resp 22 | Ht 74.0 in | Wt 195.0 lb

## 2018-06-17 DIAGNOSIS — K2950 Unspecified chronic gastritis without bleeding: Secondary | ICD-10-CM

## 2018-06-17 DIAGNOSIS — K222 Esophageal obstruction: Secondary | ICD-10-CM | POA: Diagnosis not present

## 2018-06-17 DIAGNOSIS — R1310 Dysphagia, unspecified: Secondary | ICD-10-CM | POA: Diagnosis not present

## 2018-06-17 MED ORDER — SODIUM CHLORIDE 0.9 % IV SOLN: 500.0000 mL | Freq: Once | INTRAVENOUS | Status: DC

## 2018-06-17 NOTE — Patient Instructions (Signed)
YOU HAD AN ENDOSCOPIC PROCEDURE TODAY AT [facility]:   Refer to the procedure report that was given to you for any specific questions about what was found during the examination.  If the procedure report does not answer your questions, please call your gastroenterologist to clarify.  If you requested that your care partner not be given the details of your procedure findings, then the procedure report has been included in a sealed envelope for you to review at your convenience later.  YOU SHOULD EXPECT: Some feelings of bloating in the abdomen. Passage of more gas than usual.  Walking can help get rid of the air that was put into your GI tract during the procedure and reduce the bloating. If you had a lower endoscopy (such as a colonoscopy or flexible sigmoidoscopy) you may notice spotting of blood in your stool or on the toilet paper. If you underwent a bowel prep for your procedure, you may not have a normal bowel movement for a few days.  Please Note:  You might notice some irritation and congestion in your nose or some drainage.  This is from the oxygen used during your procedure.  There is no need for concern and it should clear up in a day or so.  SYMPTOMS TO REPORT IMMEDIATELY:    Following upper endoscopy (EGD)  Vomiting of blood or coffee ground material  New chest pain or pain under the shoulder blades  Painful or persistently difficult swallowing  New shortness of breath  Fever of 100F or higher  Black, tarry-looking stools  For urgent or emergent issues, a gastroenterologist can be reached at any hour by calling [phone].   DIET:  We do recommend a small meal at first, but then you may proceed to your regular diet.  Drink plenty of fluids but you should avoid alcoholic beverages for 24 hours.  MEDICATIONS: Continue present medications. Use Prilosec (Omeprazole) 40 mg by mouth twice daily (patient already has this med ordered with refills).  Follow an  anti-reflux regimen indefinitely (see handouts given to you by your recovery nurse).  Please see handouts given to you by your recovery nurse.  Return to see Dr. Silverio Decamp in her office in 6 months.  ACTIVITY:  You should plan to take it easy for the rest of today and you should NOT DRIVE or use heavy machinery until tomorrow (because of the sedation medicines used during the test).    FOLLOW UP: Our staff will call the number listed on your records the next business day following your procedure to check on you and address any questions or concerns that you may have regarding the information given to you following your procedure. If we do not reach you, we will leave a message.  However, if you are feeling well and you are not experiencing any problems, there is no need to return our call.  We will assume that you have returned to your regular daily activities without incident.  If any biopsies were taken you will be contacted by phone or by letter within the next 1-3 weeks.  Please call us at [phone] if you have not heard about the biopsies in 3 weeks.   Thank you for allowing [place] to provide for your healthcare needs today.  SIGNATURES/CONFIDENTIALITY: You and/or your care partner have signed paperwork which will be entered into your electronic medical record.  These signatures attest to the fact that that the information above on your After Visit Summary has  been reviewed and is understood.  Full responsibility of the confidentiality of this discharge information lies with you and/or your care-partner.

## 2018-06-17 NOTE — Op Note (Signed)
[facility] Patient Name: Luke [name] Procedure Date: 06/17/2018 10:18 AM MRN: [id number] Endoscopist: Mauri Pole , MD Age: 73 Referring MD:  Date of Birth: Jun 01, 1945 Gender: Male Account #: [id number] Procedure:                Upper GI endoscopy Indications:              Dysphagia Medicines:                Monitored Anesthesia Care Procedure:                Pre-Anesthesia Assessment:                           - Prior to the procedure, a History and Physical                            was performed, and patient medications and                            allergies were reviewed. The patient's tolerance of                            previous anesthesia was also reviewed. The risks                            and benefits of the procedure and the sedation                            options and risks were discussed with the patient.                            All questions were answered, and informed consent                            was obtained. Prior Anticoagulants: The patient has                            taken no previous anticoagulant or antiplatelet                            agents. ASA Grade Assessment: III - A patient with                            severe systemic disease. After reviewing the risks                            and benefits, the patient was deemed in                            satisfactory condition to undergo the procedure.                           After obtaining informed consent, the endoscope was  passed under direct vision. Throughout the                            procedure, the patient's blood pressure, pulse, and                            oxygen saturations were monitored continuously. The                            Endoscope was introduced through the mouth, and                            advanced to the second part of duodenum. The upper                            GI endoscopy was accomplished without  difficulty.                            The patient tolerated the procedure well. Scope In: Scope Out: Findings:                 One benign-appearing, intrinsic mild                            (non-circumferential scarring) stenosis was found                            25 to 26 cm from the incisors. This stenosis                            measured 1.3 cm (inner diameter) x less than one cm                            (in length). The stenosis was traversed. A TTS                            dilator was passed through the scope. Dilation with                            a 13.5-14.5-15.5 mm balloon dilator was performed                            to 15.5 mm. The dilation site was examined                            following endoscope reinsertion and showed moderate                            improvement in luminal narrowing.                           Patchy mild inflammation characterized by  congestion (edema) and erythema was found in the                            entire examined stomach.                           The examined duodenum was normal. Complications:            No immediate complications. Estimated Blood Loss:     Estimated blood loss was minimal. Impression:               - Benign-appearing esophageal stenosis. Dilated.                           - Gastritis.                           - Normal examined duodenum.                           - No specimens collected. Recommendation:           - Patient has a contact number available for                            emergencies. The signs and symptoms of potential                            delayed complications were discussed with the                            patient. Return to normal activities tomorrow.                            Written discharge instructions were provided to the                            patient.                           - Resume previous diet.                           - Continue  present medications.                           - Use Prilosec (omeprazole) 40 mg PO BID.                           - Follow an antireflux regimen indefinitely.                           - Return to my office in 6 months. Mauri Pole, MD 06/17/2018 10:44:18 AM This report has been signed electronically.

## 2018-06-17 NOTE — Progress Notes (Signed)
To PACU, VSS. Report to [email or web address] 

## 2018-06-17 NOTE — Progress Notes (Signed)
Called to room to assist during endoscopic procedure.  Patient ID and intended procedure confirmed with present staff. Received instructions for my participation in the procedure from the performing physician.  

## 2018-06-18 ENCOUNTER — Telehealth: Payer: Self-pay | Admitting: *Deleted

## 2018-06-18 NOTE — Telephone Encounter (Signed)
  Follow up Call-  Call back number 06/17/2018 03/23/2018 01/29/2018  Post procedure Call Back phone  # [phone]  Permission to leave phone message Yes Yes Yes  Some recent data might be hidden     Patient questions:  Do you have a fever, pain , or abdominal swelling? No. Pain Score  0 *  Have you tolerated food without any problems? Yes.    Have you been able to return to your normal activities? Yes.    Do you have any questions about your discharge instructions: Diet   No. Medications  No. Follow up visit  No.  Do you have questions or concerns about your Care? No.  Actions: * If pain score is 4 or above: No action needed, pain <4.

## 2018-06-23 DIAGNOSIS — E113292 Type 2 diabetes mellitus with mild nonproliferative diabetic retinopathy without macular edema, left eye: Secondary | ICD-10-CM | POA: Diagnosis not present

## 2018-06-24 LAB — HM DIABETES EYE EXAM

## 2018-07-03 ENCOUNTER — Other Ambulatory Visit: Payer: Self-pay | Admitting: *Deleted

## 2018-07-03 NOTE — Patient Outreach (Signed)
[facility]) Care Management  07/03/2018  Luke [name] April 18, 1946 [id number]   RN Health Coach Case Closure  Referral Date:11/14/2017 Referral Source:Hospital Liaison Reason for Referral:Disease Management Education Insurance:Medicare   Outreach Attempt:  Multiple attempts to establish contact with patient without success. No response from letter mailed to patient. Case is being closed at this time.   Plan: RN Health Coach will close case at this time due to inability to maintain contact with patient. RN Health Coach will send MD case closure letter. RN Health Coach will send patient case closure letter.  [place] [phone] [email or web address]

## 2018-07-14 ENCOUNTER — Ambulatory Visit (INDEPENDENT_AMBULATORY_CARE_PROVIDER_SITE_OTHER): Payer: Medicare Other | Admitting: *Deleted

## 2018-07-14 ENCOUNTER — Encounter: Payer: Self-pay | Admitting: Family Medicine

## 2018-07-14 DIAGNOSIS — I5022 Chronic systolic (congestive) heart failure: Secondary | ICD-10-CM

## 2018-07-14 DIAGNOSIS — I255 Ischemic cardiomyopathy: Secondary | ICD-10-CM

## 2018-07-15 ENCOUNTER — Telehealth: Payer: Self-pay

## 2018-07-15 NOTE — Telephone Encounter (Signed)
Left message for patient to remind of missed remote transmission.  

## 2018-07-18 LAB — CUP PACEART REMOTE DEVICE CHECK
Battery Remaining Longevity: 25 mo
Battery Remaining Percentage: 29 %
Battery Voltage: 2.87 V
Brady Statistic AP VP Percent: 11 %
Brady Statistic AP VS Percent: 1 %
Brady Statistic AS VP Percent: 89 %
Brady Statistic AS VS Percent: 1 %
Brady Statistic RA Percent Paced: 11 %
Date Time Interrogation Session: 20200313194129
HighPow Impedance: 74 Ohm
HighPow Impedance: 74 Ohm
Implantable Lead Implant Date: 20140313
Implantable Lead Implant Date: 20140313
Implantable Lead Implant Date: 20140313
Implantable Lead Location: 753858
Implantable Lead Location: 753859
Implantable Lead Location: 753860
Implantable Pulse Generator Implant Date: 20140313
Lead Channel Impedance Value: 430 Ohm
Lead Channel Impedance Value: 450 Ohm
Lead Channel Impedance Value: 900 Ohm
Lead Channel Pacing Threshold Amplitude: 0.75 V
Lead Channel Pacing Threshold Amplitude: 0.75 V
Lead Channel Pacing Threshold Amplitude: 1 V
Lead Channel Pacing Threshold Pulse Width: 0.5 ms
Lead Channel Pacing Threshold Pulse Width: 0.5 ms
Lead Channel Pacing Threshold Pulse Width: 0.6 ms
Lead Channel Sensing Intrinsic Amplitude: 11.9 mV
Lead Channel Sensing Intrinsic Amplitude: 3.6 mV
Lead Channel Setting Pacing Amplitude: 2 V
Lead Channel Setting Pacing Amplitude: 2 V
Lead Channel Setting Pacing Amplitude: 2.5 V
Lead Channel Setting Pacing Pulse Width: 0.5 ms
Lead Channel Setting Pacing Pulse Width: 0.6 ms
Lead Channel Setting Sensing Sensitivity: 0.5 mV
Pulse Gen Serial Number: 7097007

## 2018-07-21 ENCOUNTER — Encounter: Payer: Self-pay | Admitting: Cardiology

## 2018-07-21 NOTE — Progress Notes (Signed)
Remote ICD transmission.   

## 2018-08-04 ENCOUNTER — Telehealth: Payer: Self-pay | Admitting: *Deleted

## 2018-08-04 MED ORDER — GLUCOSE BLOOD VI STRP: ORAL_STRIP | 0 refills | Status: DC

## 2018-08-04 NOTE — Telephone Encounter (Signed)
Patient called stating that he is out of his test strips because when it was refilled there was only enough sent in to last him for 45 days. Advised patient that a refill will be sent in today for a 90 day supply with him checking his sugar twice a day. Advised patient that if the pharmacy has any problems with the refill to have them call the office and let us know what needs to be done for him to get the test strips.

## 2018-08-05 ENCOUNTER — Other Ambulatory Visit: Payer: Self-pay | Admitting: Family Medicine

## 2018-08-06 ENCOUNTER — Other Ambulatory Visit: Payer: Self-pay | Admitting: Family Medicine

## 2018-08-07 ENCOUNTER — Other Ambulatory Visit: Payer: Self-pay | Admitting: Family Medicine

## 2018-08-15 ENCOUNTER — Other Ambulatory Visit: Payer: Self-pay | Admitting: Family Medicine

## 2018-09-24 ENCOUNTER — Other Ambulatory Visit: Payer: Self-pay | Admitting: Cardiology

## 2018-09-27 ENCOUNTER — Encounter (HOSPITAL_COMMUNITY)
Admission: EM | Disposition: A | Discharge status: Home / Self Care | Payer: Self-pay | Source: Home / Self Care | Attending: Emergency Medicine

## 2018-09-27 ENCOUNTER — Encounter (HOSPITAL_COMMUNITY): Payer: Self-pay | Admitting: *Deleted

## 2018-09-27 ENCOUNTER — Emergency Department (HOSPITAL_COMMUNITY): Payer: Medicare Other | Admitting: Certified Registered Nurse Anesthetist

## 2018-09-27 ENCOUNTER — Emergency Department (HOSPITAL_COMMUNITY): Payer: Medicare Other

## 2018-09-27 ENCOUNTER — Ambulatory Visit (HOSPITAL_COMMUNITY)
Admission: EM | Admit: 2018-09-27 | Discharge: 2018-09-27 | Disposition: A | Discharge status: Home / Self Care | Payer: Medicare Other | Attending: Emergency Medicine | Admitting: Emergency Medicine

## 2018-09-27 ENCOUNTER — Other Ambulatory Visit: Payer: Self-pay

## 2018-09-27 DIAGNOSIS — T18128A Food in esophagus causing other injury, initial encounter: Secondary | ICD-10-CM | POA: Diagnosis not present

## 2018-09-27 DIAGNOSIS — I129 Hypertensive chronic kidney disease with stage 1 through stage 4 chronic kidney disease, or unspecified chronic kidney disease: Secondary | ICD-10-CM | POA: Insufficient documentation

## 2018-09-27 DIAGNOSIS — I447 Left bundle-branch block, unspecified: Secondary | ICD-10-CM | POA: Diagnosis not present

## 2018-09-27 DIAGNOSIS — I4891 Unspecified atrial fibrillation: Secondary | ICD-10-CM | POA: Insufficient documentation

## 2018-09-27 DIAGNOSIS — F419 Anxiety disorder, unspecified: Secondary | ICD-10-CM | POA: Insufficient documentation

## 2018-09-27 DIAGNOSIS — I252 Old myocardial infarction: Secondary | ICD-10-CM | POA: Diagnosis not present

## 2018-09-27 DIAGNOSIS — Z95810 Presence of automatic (implantable) cardiac defibrillator: Secondary | ICD-10-CM | POA: Diagnosis not present

## 2018-09-27 DIAGNOSIS — Z87891 Personal history of nicotine dependence: Secondary | ICD-10-CM | POA: Diagnosis not present

## 2018-09-27 DIAGNOSIS — Z1159 Encounter for screening for other viral diseases: Secondary | ICD-10-CM | POA: Diagnosis not present

## 2018-09-27 DIAGNOSIS — Z794 Long term (current) use of insulin: Secondary | ICD-10-CM | POA: Insufficient documentation

## 2018-09-27 DIAGNOSIS — T189XXA Foreign body of alimentary tract, part unspecified, initial encounter: Secondary | ICD-10-CM | POA: Diagnosis not present

## 2018-09-27 DIAGNOSIS — I10 Essential (primary) hypertension: Secondary | ICD-10-CM | POA: Diagnosis not present

## 2018-09-27 DIAGNOSIS — E785 Hyperlipidemia, unspecified: Secondary | ICD-10-CM | POA: Insufficient documentation

## 2018-09-27 DIAGNOSIS — Z03818 Encounter for observation for suspected exposure to other biological agents ruled out: Secondary | ICD-10-CM | POA: Diagnosis not present

## 2018-09-27 DIAGNOSIS — I255 Ischemic cardiomyopathy: Secondary | ICD-10-CM | POA: Insufficient documentation

## 2018-09-27 DIAGNOSIS — K222 Esophageal obstruction: Secondary | ICD-10-CM | POA: Insufficient documentation

## 2018-09-27 DIAGNOSIS — X58XXXA Exposure to other specified factors, initial encounter: Secondary | ICD-10-CM | POA: Diagnosis not present

## 2018-09-27 DIAGNOSIS — I2510 Atherosclerotic heart disease of native coronary artery without angina pectoris: Secondary | ICD-10-CM | POA: Diagnosis not present

## 2018-09-27 DIAGNOSIS — K859 Acute pancreatitis without necrosis or infection, unspecified: Secondary | ICD-10-CM | POA: Diagnosis not present

## 2018-09-27 DIAGNOSIS — I700 Atherosclerosis of aorta: Secondary | ICD-10-CM | POA: Diagnosis not present

## 2018-09-27 DIAGNOSIS — N183 Chronic kidney disease, stage 3 (moderate): Secondary | ICD-10-CM | POA: Insufficient documentation

## 2018-09-27 DIAGNOSIS — E1151 Type 2 diabetes mellitus with diabetic peripheral angiopathy without gangrene: Secondary | ICD-10-CM | POA: Insufficient documentation

## 2018-09-27 DIAGNOSIS — N529 Male erectile dysfunction, unspecified: Secondary | ICD-10-CM | POA: Diagnosis not present

## 2018-09-27 DIAGNOSIS — J8410 Pulmonary fibrosis, unspecified: Secondary | ICD-10-CM | POA: Diagnosis not present

## 2018-09-27 DIAGNOSIS — E1122 Type 2 diabetes mellitus with diabetic chronic kidney disease: Secondary | ICD-10-CM | POA: Insufficient documentation

## 2018-09-27 DIAGNOSIS — Z7982 Long term (current) use of aspirin: Secondary | ICD-10-CM | POA: Insufficient documentation

## 2018-09-27 DIAGNOSIS — Z8249 Family history of ischemic heart disease and other diseases of the circulatory system: Secondary | ICD-10-CM | POA: Insufficient documentation

## 2018-09-27 DIAGNOSIS — J439 Emphysema, unspecified: Secondary | ICD-10-CM | POA: Diagnosis not present

## 2018-09-27 DIAGNOSIS — K449 Diaphragmatic hernia without obstruction or gangrene: Secondary | ICD-10-CM | POA: Insufficient documentation

## 2018-09-27 DIAGNOSIS — Z79899 Other long term (current) drug therapy: Secondary | ICD-10-CM | POA: Diagnosis not present

## 2018-09-27 HISTORY — PX: ESOPHAGOGASTRODUODENOSCOPY (EGD) WITH PROPOFOL: SHX5813

## 2018-09-27 HISTORY — PX: FOREIGN BODY REMOVAL: SHX962

## 2018-09-27 LAB — BASIC METABOLIC PANEL
Anion gap: 12 (ref 5–15)
BUN: 22 mg/dL (ref 8–23)
CO2: 24 mmol/L (ref 22–32)
Calcium: 9.9 mg/dL (ref 8.9–10.3)
Chloride: 102 mmol/L (ref 98–111)
Creatinine, Ser: 1.26 mg/dL — ABNORMAL HIGH (ref 0.61–1.24)
GFR calc Af Amer: >60 mL/min (ref >60)
GFR calc non Af Amer: 57 mL/min — ABNORMAL LOW (ref >60)
Glucose, Bld: 213 mg/dL — ABNORMAL HIGH (ref 70–99)
Potassium: 4.1 mmol/L (ref 3.5–5.1)
Sodium: 138 mmol/L (ref 135–145)

## 2018-09-27 LAB — CBC
HCT: 42.9 % (ref 39.0–52.0)
Hemoglobin: 14.6 g/dL (ref 13.0–17.0)
MCH: 32.2 pg (ref 26.0–34.0)
MCHC: 34 g/dL (ref 30.0–36.0)
MCV: 94.5 fL (ref 80.0–100.0)
Platelets: 268 10*3/uL (ref 150–400)
RBC: 4.54 MIL/uL (ref 4.22–5.81)
RDW: 12.5 % (ref 11.5–15.5)
WBC: 11 10*3/uL — ABNORMAL HIGH (ref 4.0–10.5)
nRBC: 0 % (ref 0.0–0.2)

## 2018-09-27 LAB — CBG MONITORING, ED: Glucose-Capillary: 169 mg/dL — ABNORMAL HIGH (ref 70–99)

## 2018-09-27 LAB — SARS CORONAVIRUS 2 BY RT PCR (HOSPITAL ORDER, PERFORMED IN ~~LOC~~ HOSPITAL LAB): SARS Coronavirus 2: NEGATIVE

## 2018-09-27 SURGERY — ESOPHAGOGASTRODUODENOSCOPY (EGD) WITH PROPOFOL
Anesthesia: Monitor Anesthesia Care

## 2018-09-27 MED ORDER — PROPOFOL 10 MG/ML IV BOLUS
INTRAVENOUS | Status: DC | PRN
  Administered 2018-09-27: 20 mg via INTRAVENOUS
  Administered 2018-09-27: 10 mg via INTRAVENOUS
  Administered 2018-09-27: 20 mg via INTRAVENOUS

## 2018-09-27 MED ORDER — PROPOFOL 500 MG/50ML IV EMUL
INTRAVENOUS | Status: DC | PRN
  Administered 2018-09-27: 100 ug/kg/min via INTRAVENOUS

## 2018-09-27 MED ORDER — LACTATED RINGERS IV SOLN
INTRAVENOUS | Status: DC
  Administered 2018-09-27: 12:00:00 via INTRAVENOUS

## 2018-09-27 MED ORDER — LIDOCAINE 2% (20 MG/ML) 5 ML SYRINGE
INTRAMUSCULAR | Status: DC | PRN
  Administered 2018-09-27: 60 mg via INTRAVENOUS

## 2018-09-27 MED ORDER — SODIUM CHLORIDE 0.9 % IV SOLN: INTRAVENOUS | Status: DC

## 2018-09-27 SURGICAL SUPPLY — 15 items

## 2018-09-27 NOTE — ED Triage Notes (Signed)
Pt reports he has an esophageal stricture. Reports he was eating steak last night and a piece got stuck in his esophagus. GI was already notified of his arrival today. Pt reports difficulty swallowing and reports he can only swallow saliva at present time. Pt is alert and oriented x4. Pt denies any pain at present time.

## 2018-09-27 NOTE — Anesthesia Preprocedure Evaluation (Addendum)
Anesthesia Evaluation  Patient identified by MRN, date of birth, ID band Patient awake    Reviewed: Allergy & Precautions, NPO status , Patient's Chart, lab work & pertinent test results  History of Anesthesia Complications Negative for: history of anesthetic complications  Airway Mallampati: II  TM Distance: >3 FB Neck ROM: Full    Dental  (+) Dental Advisory Given, Lower Dentures, Upper Dentures   Pulmonary former smoker,    breath sounds clear to auscultation       Cardiovascular hypertension, + CAD, + Past MI and + Peripheral Vascular Disease  + dysrhythmias Atrial Fibrillation + pacemaker + Cardiac Defibrillator  Rhythm:Regular     Neuro/Psych PSYCHIATRIC DISORDERS Anxiety    GI/Hepatic Neg liver ROS, GERD  Medicated,Food impaction   Endo/Other  diabetes, Type 2  Renal/GU CRFRenal disease     Musculoskeletal negative musculoskeletal ROS (+)   Abdominal   Peds  Hematology negative hematology ROS (+)   Anesthesia Other Findings   Reproductive/Obstetrics                            Anesthesia Physical Anesthesia Plan  ASA: III  Anesthesia Plan: MAC   Post-op Pain Management:    Induction: Intravenous and Rapid sequence  PONV Risk Score and Plan: 1 and Treatment may vary due to age or medical condition and Propofol infusion  Airway Management Planned: Mask  Additional Equipment: None  Intra-op Plan:   Post-operative Plan:   Informed Consent: I have reviewed the patients History and Physical, chart, labs and discussed the procedure including the risks, benefits and alternatives for the proposed anesthesia with the patient or authorized representative who has indicated his/her understanding and acceptance.     Dental advisory given  Plan Discussed with: CRNA and Surgeon  Anesthesia Plan Comments:        Anesthesia Quick Evaluation

## 2018-09-27 NOTE — ED Notes (Addendum)
Paged GI to inform of pt's arrival.

## 2018-09-27 NOTE — ED Provider Notes (Signed)
[facility] EMERGENCY DEPARTMENT Provider Note   CSN: [id number] Arrival date & time: 09/27/18  4332    History   Chief Complaint Chief Complaint  Patient presents with  . food bolus    HPI Luke [name] is a 73 y.o. male.     HPI  73 year old man history of esophageal stricture with 2 previous dilations presents today complaining of food impaction.  He states he ate steak for dinner last night and since that time is been unable to swallow things down and feels like there is a food impaction in the middle of his chest.  He states he was able to slowly sip half a cup of coffee today.  He has not had emesis.  He has no significant pain.  He spoke with Dr. Benson Norway, on-call for his gastroenterologist.  He presents to the ED for evaluation prior to endoscopy.  He denies any cough, fever, chills, exposure to COVID-19, or travel.  Past Medical History:  Diagnosis Date  . AAA (abdominal aortic aneurysm) ([place]) 2011   Per vascular surgery  . Anxiety    no med. in use for anxiety but pt. speaks openly of his stress & anxious feelings regarding impending surgery    . Atrial fibrillation ([place])   . Automatic implantable cardioverter-defibrillator in situ   . CAD (coronary artery disease)    Presumed CAD with nuclear scan October 09, 2011,  large anteroseptal MI and inferior MI. Catheterization scheduled October 15, 2011  . Cardiomyopathy [facility])    Nuclear, October 09, 2011, EF 30%, multiple focal wall motion abnormalities  . Diabetes mellitus    type II  . Drug therapy    Hyperkalemia with spironolactone  . Ejection fraction < 50%    EF 30%, nuclear, October 09, 2011  . Ejection fraction < 50%    EF previously 30%  //   EF 30-35%, echo, July 14, 2012, severe diffuse hypokinesis, PA pressure 43 mm mercury  . Fall due to ice or snow Feb.  19, 2015  . HLD (hyperlipidemia)   . Hypertension    white coat HTN-- often elevated in office and controlled on outside checks.  . ICD  (implantable cardioverter-defibrillator) in place    CRT-D placed March, 2014 complete heart block and k dysfunction  . LBBB (left bundle branch block)    LBBB on EKG October 11, 2011,  no prior EKG has been done  . Low testosterone    Hx of  . Myocardial infarction ([place])   . Pacemaker   . PAD (peripheral artery disease) ([place])   . Pancreatitis     Patient Active Problem List   Diagnosis Date Noted  . Esophageal stricture   . Dysphagia 11/19/2017  . Acute pancreatitis 11/10/2017  . CKD (chronic kidney disease), stage III ([place]) 11/10/2017  . SIRS (systemic inflammatory response syndrome) ([place]) 11/10/2017  . Pancreatitis 11/10/2017  . Healthcare maintenance 11/22/2016  . Tinnitus 05/23/2015  . Medicare annual wellness visit, subsequent 10/20/2014  . Advance care planning 10/20/2014  . S/P cervical spinal fusion 06/09/2014  . Preop cardiovascular exam 08/13/2013  . Central cord syndrome ([place]) 06/24/2013  . Encounter for fitting or adjustment of automatic implantable cardioverter-defibrillator 04/20/2013  . Drug therapy   . Umbilical hernia, incarcerated s/p lap repair with mesh 05/14/2013 03/19/2013  . Status post abdominal aortic aneurysm repair 12/30/2012  . HLD (hyperlipidemia) 09/16/2012  . Ejection fraction < 50%   . Complete heart block ([place]) 07/14/2012  . SK (  seborrheic keratosis) 02/12/2012  . Shoulder pain 11/08/2011  . CAD (coronary artery disease)   . Ischemic cardiomyopathy   . LBBB (left bundle branch block)   . PVD (peripheral vascular disease) ([place]) 10/08/2010  . ORGANIC IMPOTENCE 07/05/2010  . Essential hypertension, benign 02/27/2010  . Type 2 diabetes mellitus with vascular disease ([place]) 11/16/2009  . AAA (abdominal aortic aneurysm) ([place]) 05/06/2009    Past Surgical History:  Procedure Laterality Date  . ABDOMINAL AORTAGRAM N/A 10/28/2011   Procedure: ABDOMINAL [name];  Surgeon: Angelia Mould, MD;  Location: [facility] CATH LAB;  Service: Cardiovascular;   Laterality: N/A;  . ABDOMINAL AORTIC ANEURYSM REPAIR     EVAR   . BI-VENTRICULAR IMPLANTABLE CARDIOVERTER DEFIBRILLATOR N/A 07/16/2012   Procedure: BI-VENTRICULAR IMPLANTABLE CARDIOVERTER DEFIBRILLATOR  (CRT-D);  Surgeon: Deboraha Sprang, MD;  Location: [facility] CATH LAB;  Service: Cardiovascular;  Laterality: N/A;  . CARDIAC CATHETERIZATION    . COLONOSCOPY    . ESOPHAGOGASTRODUODENOSCOPY (EGD) WITH PROPOFOL N/A 02/05/2018   Procedure: ESOPHAGOGASTRODUODENOSCOPY (EGD) WITH PROPOFOL;  Surgeon: Mauri Pole, MD;  Location: [facility] ENDOSCOPY;  Service: Endoscopy;  Laterality: N/A;  . HERNIA REPAIR  Jan. 9, 2015  . INSERTION OF MESH N/A 05/14/2013   Procedure: INSERTION OF MESH;  Surgeon: Adin Hector, MD;  Location: [place];  Service: General;  Laterality: N/A;  . LOWER EXTREMITY ANGIOGRAM Bilateral 10/28/2011   Procedure: LOWER EXTREMITY ANGIOGRAM;  Surgeon: Angelia Mould, MD;  Location: [facility] CATH LAB;  Service: Cardiovascular;  Laterality: Bilateral;  . PACEMAKER INSERTION  07-16-12   pacemaker/defibrilator  . POSTERIOR CERVICAL FUSION/FORAMINOTOMY N/A 06/09/2014   Procedure: Laminectomy - Cervical two-Cervcial four posterior cervical instrumented fusion Cervical two-cervical four;  Surgeon: Eustace Moore, MD;  Location: [place] NEURO ORS;  Service: Neurosurgery;  Laterality: N/A;  posterior   . SAVORY DILATION N/A 02/05/2018   Procedure: SAVORY DILATION;  Surgeon: Mauri Pole, MD;  Location: [facility] ENDOSCOPY;  Service: Endoscopy;  Laterality: N/A;  . TONSILLECTOMY     as a child   . UMBILICAL HERNIA REPAIR N/A 05/14/2013   Procedure: LAPAROSCOPIC exploration and repair of hernia in abdominal ;  Surgeon: Adin Hector, MD;  Location: [place];  Service: General;  Laterality: N/A;        Home Medications    Prior to Admission medications   Medication Sig Start Date End Date Taking? Authorizing Provider  amLODipine (NORVASC) 10 MG tablet Take 1 tablet (10 mg total) by mouth daily. 03/16/18    Jerline Pain, MD  aspirin EC 81 MG tablet Take 81 mg by mouth daily.    [provider]  carvedilol (COREG) 25 MG tablet TAKE 1 TABLET BY MOUTH TWICE DAILY WITH MEALS 09/24/18   Jerline Pain, MD  Cholecalciferol (VITAMIN D PO) Take 1,200 Units by mouth daily.     [provider]  glipiZIDE (GLUCOTROL) 5 MG tablet TAKE 2 TABLETS(10 MG) BY MOUTH TWICE DAILY BEFORE A MEAL 08/06/18   Tonia Ghent, MD  glucose blood test strip USE TO TEST BLOOD SUGAR TWICE DAILY& AS DIRECTED 08/04/18   Tonia Ghent, MD  hydrALAZINE (APRESOLINE) 25 MG tablet TAKE 1 TABLET(25 MG) BY MOUTH THREE TIMES DAILY 08/17/18   Tonia Ghent, MD  Insulin Glargine (LANTUS SOLOSTAR) 100 UNIT/ML Solostar Pen Inject 25 Units into the skin daily at 10 pm. 02/12/18   Tonia Ghent, MD  Insulin Pen Needle (PENTIPS) 31G X 5 MM MISC Use daily with insulin pen  07/10/17   Tonia Ghent, MD  metFORMIN (GLUCOPHAGE) 500 MG tablet TAKE 2 TABLETS BY MOUTH TWICE DAILY WITH FOOD 08/05/18   Tonia Ghent, MD  Multiple Vitamin (MULTIVITAMIN) tablet Take 1 tablet by mouth daily.     [provider]  omeprazole (PRILOSEC) 40 MG capsule Take 1 capsule (40 mg total) by mouth 2 (two) times daily before a meal. 06/05/18 12/02/18  Nandigam, Venia Minks, MD  sildenafil (REVATIO) 20 MG tablet TAKE THREE TO FOUR TABLETS BY MOUTH DAILY AS NEEDED FOR ED 11/18/17   Tonia Ghent, MD  simvastatin (ZOCOR) 40 MG tablet Take 1 tablet (40 mg total) by mouth at bedtime. 02/12/18   Tonia Ghent, MD  vitamin B-12 (CYANOCOBALAMIN) 500 MCG tablet Take 500 mcg by mouth daily.    [provider]    Family History Family History  Problem Relation Age of Onset  . Hypertension Mother   . Stroke Mother   . Hyperlipidemia Mother   . Diabetes Sister   . Heart disease Sister        Before age 23  . Hypertension Sister   . Hyperlipidemia Sister   . Heart attack Sister   . Cancer Father        Lung  . Hypertension Son    . Colon cancer Neg Hx   . Prostate cancer Neg Hx     Social History Social History   Tobacco Use  . Smoking status: Former Smoker    Packs/day: 2.00    Years: 40.00    Pack years: 80.00    Types: Cigarettes    Last attempt to quit: 05/06/2004    Years since quitting: 14.4  . Smokeless tobacco: Never Used  Substance Use Topics  . Alcohol use: Yes    Alcohol/week: 0.0 - 2.0 standard drinks    Comment: beer or wine  occassionally  . Drug use: No     Allergies   Aldactone [spironolactone]; Codeine; Januvia [sitagliptin]; and Lisinopril   Review of Systems Review of Systems  All other systems reviewed and are negative.    Physical Exam Updated Vital Signs Temp 98.2 F (36.8 C) (Oral)   Ht 1.88 m (6\' 2" )   Wt 86.2 kg   BMI 24.39 kg/m   Physical Exam Vitals signs and nursing note reviewed.  Constitutional:      Appearance: Normal appearance. He is normal weight. He is not ill-appearing.  HENT:     Head: Normocephalic and atraumatic.     Right Ear: External ear normal.     Left Ear: External ear normal.     Nose: Nose normal.     Mouth/Throat:     Mouth: Mucous membranes are moist.  Eyes:     Extraocular Movements: Extraocular movements intact.     Pupils: Pupils are equal, round, and reactive to light.  Neck:     Musculoskeletal: Normal range of motion.  Cardiovascular:     Rate and Rhythm: Normal rate and regular rhythm.  Pulmonary:     Effort: Pulmonary effort is normal.     Breath sounds: Normal breath sounds.  Abdominal:     General: Abdomen is flat. Bowel sounds are normal.     Palpations: Abdomen is soft.  Musculoskeletal: Normal range of motion.  Skin:    General: Skin is warm and dry.     Capillary Refill: Capillary refill takes less than 2 seconds.  Neurological:     General: No focal deficit present.  Mental Status: He is alert.  Psychiatric:        Mood and Affect: Mood normal.      ED Treatments / Results  Labs (all labs ordered  are listed, but only abnormal results are displayed) Labs Reviewed - No data to display  EKG EKG Interpretation  Date/Time:  Sunday Sep 27 2018 08:44:02 EDT Ventricular Rate:  85 PR Interval:    QRS Duration: 159 QT Interval:  436 QTC Calculation: 519 R Axis:   -100 Text Interpretation:  VENTRICULAR PACED RHYTHM No significant change since last tracing Confirmed by Pattricia Boss [phone]) on 09/27/2018 8:58:55 AM   Radiology Dg Chest Port 1 View  Result Date: 09/27/2018 CLINICAL DATA:  Chest pressure EXAM: PORTABLE CHEST 1 VIEW COMPARISON:  November 10, 2017 FINDINGS: There is fibrotic change throughout the mid and lower lung zones with scarring in the left base. Heart size is within normal limits. There is decreased vascularity in the upper lobes, likely due to underlying bullous emphysematous change, stable from prior study. Pacemaker leads are attached to the right atrium, right ventricle, and coronary sinus. No adenopathy. No bone lesions. IMPRESSION: Apparent underlying emphysematous change with extensive fibrosis in the mid lower lung zones, stable. Scarring left base, stable. No frank edema or consolidation. No new opacity evident. Stable cardiac silhouette. Stable pacemaker lead placements. Aortic Atherosclerosis (ICD10-I70.0). Electronically Signed   By: Lowella Grip III M.D.   On: 09/27/2018 09:05    Procedures Procedures (including critical care time)  Medications Ordered in ED Medications - No data to display   Initial Impression / Assessment and Plan / ED Course  I have reviewed the triage vital signs and the nursing notes.  Pertinent labs & imaging results that were available during my care of the patient were reviewed by me and considered in my medical decision making (see chart for details).    73 year old man presents today with food impaction.  He was sent here by gastroenterologist for evaluation and COVID testing.  COVID test here is negative.  Labs are essentially  normal with mildly elevated creatinine at 1.26 with last recorded 1.15 but has previously been as high as 1.38 Chest x-Kizzy Olafson has underlying emphysematous eczematous and fibrotic changes but no acute abnormalities   EKG shows a paced rhythm Patient appears stable for endoscopy. Dr. Benson Norway paged  10:31 AM Discussed with Dr. Benson Norway and aware that patient is in department and ready for endoscopy.  Final Clinical Impressions(s) / ED Diagnoses   Final diagnoses:  Food impaction of esophagus, initial encounter    ED Discharge Orders    None       Pattricia Boss, MD 09/27/18 1031

## 2018-09-27 NOTE — Discharge Instructions (Signed)

## 2018-09-27 NOTE — Transfer of Care (Signed)
Immediate Anesthesia Transfer of Care Note  Patient: Luke [name]  Procedure(s) Performed: ESOPHAGOGASTRODUODENOSCOPY (EGD) WITH PROPOFOL (N/A )  Patient Location: Endoscopy Unit  Anesthesia Type:MAC  Level of Consciousness: awake, alert  and oriented  Airway & Oxygen Therapy: Patient Spontanous Breathing and Patient connected to nasal cannula oxygen  Post-op Assessment: Report given to RN and Post -op Vital signs reviewed and stable  Post vital signs: Reviewed and stable  Last Vitals:  Vitals Value Taken Time  BP    Temp    Pulse 72 09/27/2018 12:35 PM  Resp 20 09/27/2018 12:35 PM  SpO2 91 % 09/27/2018 12:35 PM  Vitals shown include unvalidated device data.  Last Pain:  Vitals:   09/27/18 1145  TempSrc: Oral  PainSc: 0-No pain         Complications: No apparent anesthesia complications

## 2018-09-27 NOTE — Op Note (Signed)
[facility] Psychiatric Patient Name: Luke [name] Procedure Date : 09/27/2018 MRN: [id number] Attending MD: Carol Ada , MD Date of Birth: [birth year] CSN: [id number] Age: 73 Admit Type: Emergency Department Procedure:                Upper GI endoscopy Indications:              Therapeutic procedure Providers:                Carol Ada, MD, Baird Cancer, RN, Laverda Sorenson,                            Technician, Clearnce Sorrel, CRNA Referring MD:              Medicines:                Propofol per Anesthesia Complications:            No immediate complications. Estimated Blood Loss:     Estimated blood loss: none. Procedure:                Pre-Anesthesia Assessment:                           - Prior to the procedure, a History and Physical                            was performed, and patient medications and                            allergies were reviewed. The patient's tolerance of                            previous anesthesia was also reviewed. The risks                            and benefits of the procedure and the sedation                            options and risks were discussed with the patient.                            All questions were answered, and informed consent                            was obtained. Prior Anticoagulants: The patient has                            taken no previous anticoagulant or antiplatelet                            agents. ASA Grade Assessment: III - A patient with                            severe systemic disease. After reviewing the risks  and benefits, the patient was deemed in                            satisfactory condition to undergo the procedure.                           - Sedation was administered by an anesthesia                            professional. Deep sedation was attained.                           After obtaining informed consent, the endoscope was                            passed  under direct vision. Throughout the                            procedure, the patient's blood pressure, pulse, and                            oxygen saturations were monitored continuously. The                            GIF-H190 ([id number]) Olympus gastroscope was                            introduced through the mouth, and advanced to the                            second part of duodenum. The upper GI endoscopy was                            technically difficult and complex. The patient                            tolerated the procedure well. Scope In: Scope Out: Findings:      One benign-appearing, intrinsic moderate stenosis was found 28 cm from       the incisors. This stenosis measured 1.2 cm (inner diameter) x less than       one cm (in length). The stenosis was traversed.      Food was found in the upper third of the esophagus.      A 3 cm hiatal hernia was present.      The stomach was normal.      The examined duodenum was normal.      Upon initial entry into the esophagus there was saliva that was       encountered. This was suctioned and a clear visualization of the meat       bolus was identified. Using mild to moderate forward pressure, the food       bolus was able to be pushed through the stenosis. This was also aided by       a cough by the patient at the time of the maneuvering. Inspection of the  stenosis revealed that it was at 28 cm and benign appearing. Proximal to       this area a stellate type mucosal scar was noted as well as a cervical       inlet patch. Impression:               - Benign-appearing esophageal stenosis.                           - Food in the upper third of the esophagus.                           - 3 cm hiatal hernia.                           - Normal stomach.                           - Normal examined duodenum.                           - No specimens collected. Recommendation:           - Patient has a contact number available for                             emergencies. The signs and symptoms of potential                            delayed complications were discussed with the                            patient. Return to normal activities tomorrow.                            Written discharge instructions were provided to the                            patient.                           - Resume previous diet.                           - Continue present medications.                           - Repeat upper endoscopy at the next available                            appointment for retreatment with Dr. Silverio Decamp.                           - Chew food until it is liquified and swallow in                            small portions. Procedure Code(s):        --- Professional ---  78242, Esophagogastroduodenoscopy, flexible,                            transoral; diagnostic, including collection of                            specimen(s) by brushing or washing, when performed                            (separate procedure) Diagnosis Code(s):        --- Professional ---                           K22.2, Esophageal obstruction                           P53.614E, Food in esophagus causing other injury,                            initial encounter                           K44.9, Diaphragmatic hernia without obstruction or                            gangrene CPT copyright 2019 [employer]. All rights reserved. The codes documented in this report are preliminary and upon coder review may  be revised to meet current compliance requirements. Carol Ada, MD Carol Ada, MD 09/27/2018 12:38:25 PM This report has been signed electronically. Number of Addenda: 0

## 2018-09-27 NOTE — Consult Note (Signed)
Consult Note for [facility]  Reason for Consult: Food impaction Referring Physician: ER  Luke [name] HPI: This is a 73 year old male with a PMH of an upper esophageal stricture s/p dilations by Dr. Silverio Decamp for a proximal esophageal stricture.  His last dilation was on 06/15/2018 and he was dilated up to 15 mm with a balloon dilator.  He started to have issues with dysphagia in September last year and he underwent a total of 4 dilations, with the February dilation being the last one.  He was eating a piece of steak last evening and it was not able to pass.  Overnight he found it increasingly difficult to manage his saliva.  Past Medical History:  Diagnosis Date  . AAA (abdominal aortic aneurysm) ([place]) 2011   Per vascular surgery  . Anxiety    no med. in use for anxiety but pt. speaks openly of his stress & anxious feelings regarding impending surgery    . Atrial fibrillation ([place])   . Automatic implantable cardioverter-defibrillator in situ   . CAD (coronary artery disease)    Presumed CAD with nuclear scan October 09, 2011,  large anteroseptal MI and inferior MI. Catheterization scheduled October 15, 2011  . Cardiomyopathy [facility])    Nuclear, October 09, 2011, EF 30%, multiple focal wall motion abnormalities  . Diabetes mellitus    type II  . Drug therapy    Hyperkalemia with spironolactone  . Ejection fraction < 50%    EF 30%, nuclear, October 09, 2011  . Ejection fraction < 50%    EF previously 30%  //   EF 30-35%, echo, July 14, 2012, severe diffuse hypokinesis, PA pressure 43 mm mercury  . Fall due to ice or snow Feb.  19, 2015  . HLD (hyperlipidemia)   . Hypertension    white coat HTN-- often elevated in office and controlled on outside checks.  . ICD (implantable cardioverter-defibrillator) in place    CRT-D placed March, 2014 complete heart block and k dysfunction  . LBBB (left bundle branch block)    LBBB on EKG October 11, 2011,  no prior EKG has been done  . Low testosterone    Hx of   . Myocardial infarction ([place])   . Pacemaker   . PAD (peripheral artery disease) ([place])   . Pancreatitis     Past Surgical History:  Procedure Laterality Date  . ABDOMINAL AORTAGRAM N/A 10/28/2011   Procedure: ABDOMINAL [name];  Surgeon: Angelia Mould, MD;  Location: [facility] CATH LAB;  Service: Cardiovascular;  Laterality: N/A;  . ABDOMINAL AORTIC ANEURYSM REPAIR     EVAR   . BI-VENTRICULAR IMPLANTABLE CARDIOVERTER DEFIBRILLATOR N/A 07/16/2012   Procedure: BI-VENTRICULAR IMPLANTABLE CARDIOVERTER DEFIBRILLATOR  (CRT-D);  Surgeon: Deboraha Sprang, MD;  Location: [facility] CATH LAB;  Service: Cardiovascular;  Laterality: N/A;  . CARDIAC CATHETERIZATION    . COLONOSCOPY    . ESOPHAGOGASTRODUODENOSCOPY (EGD) WITH PROPOFOL N/A 02/05/2018   Procedure: ESOPHAGOGASTRODUODENOSCOPY (EGD) WITH PROPOFOL;  Surgeon: Mauri Pole, MD;  Location: [facility] ENDOSCOPY;  Service: Endoscopy;  Laterality: N/A;  . HERNIA REPAIR  Jan. 9, 2015  . INSERTION OF MESH N/A 05/14/2013   Procedure: INSERTION OF MESH;  Surgeon: Adin Hector, MD;  Location: [place];  Service: General;  Laterality: N/A;  . LOWER EXTREMITY ANGIOGRAM Bilateral 10/28/2011   Procedure: LOWER EXTREMITY ANGIOGRAM;  Surgeon: Angelia Mould, MD;  Location: [facility] CATH LAB;  Service: Cardiovascular;  Laterality: Bilateral;  . PACEMAKER INSERTION  07-16-12  pacemaker/defibrilator  . POSTERIOR CERVICAL FUSION/FORAMINOTOMY N/A 06/09/2014   Procedure: Laminectomy - Cervical two-Cervcial four posterior cervical instrumented fusion Cervical two-cervical four;  Surgeon: Eustace Moore, MD;  Location: [place] NEURO ORS;  Service: Neurosurgery;  Laterality: N/A;  posterior   . SAVORY DILATION N/A 02/05/2018   Procedure: SAVORY DILATION;  Surgeon: Mauri Pole, MD;  Location: [facility] ENDOSCOPY;  Service: Endoscopy;  Laterality: N/A;  . TONSILLECTOMY     as a child   . UMBILICAL HERNIA REPAIR N/A 05/14/2013   Procedure: LAPAROSCOPIC exploration and repair of hernia  in abdominal ;  Surgeon: Adin Hector, MD;  Location: [facility] OR;  Service: General;  Laterality: N/A;    Family History  Problem Relation Age of Onset  . Hypertension Mother   . Stroke Mother   . Hyperlipidemia Mother   . Diabetes Sister   . Heart disease Sister        Before age 48  . Hypertension Sister   . Hyperlipidemia Sister   . Heart attack Sister   . Cancer Father        Lung  . Hypertension Son   . Colon cancer Neg Hx   . Prostate cancer Neg Hx     Social History:  reports that he quit smoking about 14 years ago. His smoking use included cigarettes. He has a 80.00 pack-year smoking history. He has never used smokeless tobacco. He reports current alcohol use. He reports that he does not use drugs.  Allergies:  Allergies  Allergen Reactions  . Aldactone [Spironolactone] Other (See Comments)    Hyperkalemia  . Codeine Rash and Hives  . Januvia [Sitagliptin] Other (See Comments)    Diarrhea and heart racing  . Lisinopril     Possible cause of pancreatitis    Medications:  Scheduled:  Continuous: . lactated ringers 20 mL/hr at 09/27/18 1150    Results for orders placed or performed during the hospital encounter of 09/27/18 (from the past 24 hour(s))  SARS Coronavirus 2 (CEPHEID- Performed in [place] hospital lab), Hosp Order     Status: None   Collection Time: 09/27/18  8:42 AM  Result Value Ref Range   SARS Coronavirus 2 NEGATIVE NEGATIVE  CBC     Status: Abnormal   Collection Time: 09/27/18  8:54 AM  Result Value Ref Range   WBC 11.0 (H) 4.0 - 10.5 K/uL   RBC 4.54 4.22 - 5.81 MIL/uL   Hemoglobin 14.6 13.0 - 17.0 g/dL   HCT 42.9 39.0 - 52.0 %   MCV 94.5 80.0 - 100.0 fL   MCH 32.2 26.0 - 34.0 pg   MCHC 34.0 30.0 - 36.0 g/dL   RDW 12.5 11.5 - 15.5 %   Platelets 268 150 - 400 K/uL   nRBC 0.0 0.0 - 0.2 %  Basic metabolic panel     Status: Abnormal   Collection Time: 09/27/18  8:54 AM  Result Value Ref Range   Sodium 138 135 - 145 mmol/L   Potassium  4.1 3.5 - 5.1 mmol/L   Chloride 102 98 - 111 mmol/L   CO2 24 22 - 32 mmol/L   Glucose, Bld 213 (H) 70 - 99 mg/dL   BUN 22 8 - 23 mg/dL   Creatinine, Ser 1.26 (H) 0.61 - 1.24 mg/dL   Calcium 9.9 8.9 - 10.3 mg/dL   GFR calc non Af Amer 57 (L) >60 mL/min   GFR calc Af Amer >60 >60 mL/min   Anion gap 12 5 -  15  CBG monitoring, ED     Status: Abnormal   Collection Time: 09/27/18 11:32 AM  Result Value Ref Range   Glucose-Capillary 169 (H) 70 - 99 mg/dL     Dg Chest Port 1 View  Result Date: 09/27/2018 CLINICAL DATA:  Chest pressure EXAM: PORTABLE CHEST 1 VIEW COMPARISON:  November 10, 2017 FINDINGS: There is fibrotic change throughout the mid and lower lung zones with scarring in the left base. Heart size is within normal limits. There is decreased vascularity in the upper lobes, likely due to underlying bullous emphysematous change, stable from prior study. Pacemaker leads are attached to the right atrium, right ventricle, and coronary sinus. No adenopathy. No bone lesions. IMPRESSION: Apparent underlying emphysematous change with extensive fibrosis in the mid lower lung zones, stable. Scarring left base, stable. No frank edema or consolidation. No new opacity evident. Stable cardiac silhouette. Stable pacemaker lead placements. Aortic Atherosclerosis (ICD10-I70.0). Electronically Signed   By: Lowella Grip III M.D.   On: 09/27/2018 09:05    ROS:  As stated above in the HPI otherwise negative.  Blood pressure (!) 197/87, pulse 77, temperature 98.1 F (36.7 C), temperature source Oral, resp. rate 14, height 6\' 2"  (1.88 m), weight 86.2 kg, SpO2 96 %.    PE: Gen: NAD, Alert and Oriented HEENT:  [place]/AT, EOMI Neck: Supple, no LAD Lungs: CTA Bilaterally CV: RRR without M/G/R ABM: Soft, NTND, +BS Ext: No C/C/E  Assessment/Plan: 1) Food impaction. 2) Known history of a proximal benign esophageal stricture.  Plan: 1) EGD for disimpaction.  Brenlee Koskela D 09/27/2018, 12:16 PM

## 2018-09-29 ENCOUNTER — Encounter (HOSPITAL_COMMUNITY): Payer: Self-pay | Admitting: Gastroenterology

## 2018-10-01 NOTE — Anesthesia Postprocedure Evaluation (Signed)
Anesthesia Post Note  Patient: Luke [name]  Procedure(s) Performed: ESOPHAGOGASTRODUODENOSCOPY (EGD) WITH PROPOFOL (N/A ) FOREIGN BODY REMOVAL     Patient location during evaluation: Endoscopy Anesthesia Type: MAC Level of consciousness: awake and alert Pain management: pain level controlled Vital Signs Assessment: post-procedure vital signs reviewed and stable Respiratory status: spontaneous breathing, nonlabored ventilation, respiratory function stable and patient connected to nasal cannula oxygen Cardiovascular status: stable and blood pressure returned to baseline Postop Assessment: no apparent nausea or vomiting Anesthetic complications: no    Last Vitals:  Vitals:   09/27/18 1235 09/27/18 1244  BP: 134/72 (!) 142/84  Pulse: 72 72  Resp: 20 (!) 23  Temp: 36.7 C   SpO2: 93% 97%    Last Pain:  Vitals:   09/27/18 1244  TempSrc:   PainSc: 0-No pain                 Lilibeth Opie

## 2018-10-05 ENCOUNTER — Telehealth: Payer: Self-pay

## 2018-10-05 NOTE — Telephone Encounter (Signed)
Pt called back and is sched for 06/18 @8 :30a

## 2018-10-05 NOTE — Telephone Encounter (Signed)
Patient not available. Spoke with the spouse [name]. She will relay the message to the patient to call and schedule the virtual visit.

## 2018-10-05 NOTE — Telephone Encounter (Signed)
-----   Message from Mauri Pole, MD sent at 09/29/2018  9:34 AM EDT ----- Thank you Saralyn Pilar.  Beth, please schedule patient to virtual visit and EGD soon. Thanks ----- Message ----- From: Carol Ada, MD Sent: 09/27/2018  12:38 PM EDT To: Mauri Pole, MD  Vena,   Mr. Sabado came into the hospital with a meat impaction.  He will need to be dilated again.  Saralyn Pilar

## 2018-10-13 ENCOUNTER — Encounter: Payer: Medicare Other | Admitting: *Deleted

## 2018-10-14 ENCOUNTER — Telehealth: Payer: Self-pay

## 2018-10-14 NOTE — Telephone Encounter (Signed)
Left message for patient to remind of missed remote transmission.  

## 2018-10-18 ENCOUNTER — Other Ambulatory Visit: Payer: Self-pay | Admitting: Gastroenterology

## 2018-10-21 ENCOUNTER — Encounter: Payer: Self-pay | Admitting: *Deleted

## 2018-10-22 ENCOUNTER — Encounter: Payer: Self-pay | Admitting: Gastroenterology

## 2018-10-22 ENCOUNTER — Other Ambulatory Visit: Payer: Self-pay

## 2018-10-22 ENCOUNTER — Ambulatory Visit (INDEPENDENT_AMBULATORY_CARE_PROVIDER_SITE_OTHER): Payer: Medicare Other | Admitting: Gastroenterology

## 2018-10-22 VITALS — Ht 74.0 in | Wt 190.0 lb

## 2018-10-22 DIAGNOSIS — R1319 Other dysphagia: Secondary | ICD-10-CM

## 2018-10-22 DIAGNOSIS — R1310 Dysphagia, unspecified: Secondary | ICD-10-CM | POA: Diagnosis not present

## 2018-10-22 DIAGNOSIS — K222 Esophageal obstruction: Secondary | ICD-10-CM | POA: Diagnosis not present

## 2018-10-22 DIAGNOSIS — K219 Gastro-esophageal reflux disease without esophagitis: Secondary | ICD-10-CM | POA: Diagnosis not present

## 2018-10-22 DIAGNOSIS — I255 Ischemic cardiomyopathy: Secondary | ICD-10-CM

## 2018-10-22 NOTE — Patient Instructions (Signed)
You have been scheduled for an endoscopy. Please follow written instructions given to you at your visit today. If you use inhalers (even only as needed), please bring them with you on the day of your procedure.   I appreciate the  opportunity to care for you  Thank You   Harl Bowie , MD

## 2018-10-22 NOTE — Progress Notes (Signed)
Luke [name]    [id number]    01-17-1946  Primary Care Physician:Duncan, Elveria Rising, MD  Referring Physician: Tonia Ghent, MD [place],  [place] [phone]  This service was provided via audio and vidoe telemedicine (Doximity) due to [place] 19 pandemic.  Patient location: Home Provider location: Office Used 2 patient identifiers to confirm the correct person. Explained the limitations in evaluation and management via telemedicine. Patient is aware of potential medical charges for this visit.  Patient consented to this virtual visit.  The persons participating in this telemedicine service were myself and the patient   Time spent:22 min  Chief complaint: Dysphagia  HPI: 73 year old male with history of GERD, distal esophageal stricture recently in ER with acute food impaction.  He was eating a piece of steak and he felt it got lodged in his throat. EGD 09/27/18 by Dr Benson [place] with disimpaction of food bolus from distal esophagus, benign esophageal stricture approximately 1.2 cm in distal esophagus.  He has been careful since the episode of food impaction and is avoiding meat.  He is trying to eat only soft diet.  Denies any regurgitation, abdominal pain, vomiting, melena or blood per rectum.  EGD 06/17/18 distal esophageal stenosis approximately 1.3 cm, dilated with TTS balloon to 15.5 mm  EGD 03/23/2018 with proximal esophageal stricture dilated to 13 mm with TTS balloon.  Esophageal biopsies negative for eosinophilic esophagitis. Colonoscopy 03/23/2018 with removal of 16 sessile adenomatous polyps.  He was hospitalized in July 2019 with acute pancreatitis, idiopathic.  Possible secondary to medication, ?  Ace inhibitor. Repeat CT abdomen pelvis November 2019 negative for any mass lesion or IPMN.  Showed small volume peripancreatic fluid with resolution of acute pancreatitis.   Outpatient Encounter Medications as of 10/22/2018  Medication Sig  .  amLODipine (NORVASC) 10 MG tablet Take 1 tablet (10 mg total) by mouth daily.  Marland Kitchen aspirin EC 81 MG tablet Take 81 mg by mouth daily.  . carvedilol (COREG) 25 MG tablet Take 25 mg by mouth 2 (two) times daily with a meal.  . Cholecalciferol (VITAMIN D PO) Take 1,200 Units by mouth daily.   Marland Kitchen glipiZIDE (GLUCOTROL) 10 MG tablet Take 10 mg by mouth 2 (two) times daily.   . hydrALAZINE (APRESOLINE) 25 MG tablet Take 25 mg by mouth 3 (three) times daily.  . insulin glargine (LANTUS) 100 UNIT/ML injection Inject into the skin daily. 30 units a day  . metFORMIN (GLUCOPHAGE) 500 MG tablet Take 1,000 mg by mouth 2 (two) times daily with a meal.  . Multiple Vitamin (MULTIVITAMIN) tablet Take 1 tablet by mouth daily.   Marland Kitchen omeprazole (PRILOSEC) 40 MG capsule TAKE 1 CAPSULE(40 MG) BY MOUTH TWICE DAILY BEFORE A MEAL  . simvastatin (ZOCOR) 40 MG tablet Take 1 tablet (40 mg total) by mouth at bedtime.  . vitamin B-12 (CYANOCOBALAMIN) 500 MCG tablet Take 500 mcg by mouth daily.   Facility-Administered Encounter Medications as of 10/22/2018  Medication  . 0.9 %  sodium chloride infusion    Allergies as of 10/22/2018 - Review Complete 10/22/2018  Allergen Reaction Noted  . Aldactone [spironolactone] Other (See Comments) 04/20/2013  . Codeine Rash and Hives   . Januvia [sitagliptin] Other (See Comments) 11/21/2016  . Lisinopril  11/18/2017    Past Medical History:  Diagnosis Date  . AAA (abdominal aortic aneurysm) ([place]) 2011   Per vascular surgery  . Anxiety    no  med. in use for anxiety but pt. speaks openly of his stress & anxious feelings regarding impending surgery    . Atrial fibrillation ([place])   . Automatic implantable cardioverter-defibrillator in situ   . CAD (coronary artery disease)    Presumed CAD with nuclear scan October 09, 2011,  large anteroseptal MI and inferior MI. Catheterization scheduled October 15, 2011  . Cardiomyopathy [facility])    Nuclear, October 09, 2011, EF 30%, multiple focal wall motion  abnormalities  . Diabetes mellitus    type II  . Drug therapy    Hyperkalemia with spironolactone  . Ejection fraction < 50%    EF 30%, nuclear, October 09, 2011  . Ejection fraction < 50%    EF previously 30%  //   EF 30-35%, echo, July 14, 2012, severe diffuse hypokinesis, PA pressure 43 mm mercury  . Fall due to ice or snow Feb.  19, 2015  . HLD (hyperlipidemia)   . Hypertension    white coat HTN-- often elevated in office and controlled on outside checks.  . ICD (implantable cardioverter-defibrillator) in place    CRT-D placed March, 2014 complete heart block and k dysfunction  . LBBB (left bundle branch block)    LBBB on EKG October 11, 2011,  no prior EKG has been done  . Low testosterone    Hx of  . Myocardial infarction ([place])   . Pacemaker   . PAD (peripheral artery disease) ([place])   . Pancreatitis     Past Surgical History:  Procedure Laterality Date  . ABDOMINAL AORTAGRAM N/A 10/28/2011   Procedure: ABDOMINAL [name];  Surgeon: Angelia Mould, MD;  Location: [facility] CATH LAB;  Service: Cardiovascular;  Laterality: N/A;  . ABDOMINAL AORTIC ANEURYSM REPAIR     EVAR   . BI-VENTRICULAR IMPLANTABLE CARDIOVERTER DEFIBRILLATOR N/A 07/16/2012   Procedure: BI-VENTRICULAR IMPLANTABLE CARDIOVERTER DEFIBRILLATOR  (CRT-D);  Surgeon: Deboraha Sprang, MD;  Location: [facility] CATH LAB;  Service: Cardiovascular;  Laterality: N/A;  . CARDIAC CATHETERIZATION    . COLONOSCOPY    . ESOPHAGOGASTRODUODENOSCOPY (EGD) WITH PROPOFOL N/A 02/05/2018   Procedure: ESOPHAGOGASTRODUODENOSCOPY (EGD) WITH PROPOFOL;  Surgeon: Mauri Pole, MD;  [facility] ENDOSCOPY;  Service: Endoscopy;  Laterality: N/A;  . ESOPHAGOGASTRODUODENOSCOPY (EGD) WITH PROPOFOL N/A 09/27/2018   Procedure: ESOPHAGOGASTRODUODENOSCOPY (EGD) WITH PROPOFOL;  Surgeon: Carol Ada, MD;  Location: [place];  Service: Endoscopy;  Laterality: N/A;  . FOREIGN BODY REMOVAL  09/27/2018   Procedure: FOREIGN BODY REMOVAL;  Surgeon: Carol Ada, MD;  Location: [facility] ENDOSCOPY;  Service: Endoscopy;;  . HERNIA REPAIR  Jan. 9, 2015  . INSERTION OF MESH N/A 05/14/2013   Procedure: INSERTION OF MESH;  Surgeon: Adin Hector, MD;  Location: [place];  Service: General;  Laterality: N/A;  . LOWER EXTREMITY ANGIOGRAM Bilateral 10/28/2011   Procedure: LOWER EXTREMITY ANGIOGRAM;  Surgeon: Angelia Mould, MD;  Location: [facility] CATH LAB;  Service: Cardiovascular;  Laterality: Bilateral;  . PACEMAKER INSERTION  07-16-12   pacemaker/defibrilator  . POSTERIOR CERVICAL FUSION/FORAMINOTOMY N/A 06/09/2014   Procedure: Laminectomy - Cervical two-Cervcial four posterior cervical instrumented fusion Cervical two-cervical four;  Surgeon: Eustace Moore, MD;  Location: [place] NEURO ORS;  Service: Neurosurgery;  Laterality: N/A;  posterior   . SAVORY DILATION N/A 02/05/2018   Procedure: SAVORY DILATION;  Surgeon: Mauri Pole, MD;  Location[facility] ENDOSCOPY;  Service: Endoscopy;  Laterality: N/A;  . TONSILLECTOMY     as a child   . UMBILICAL HERNIA REPAIR N/A 05/14/2013  Procedure: LAPAROSCOPIC exploration and repair of hernia in abdominal ;  Surgeon: Adin Hector, MD;  Location: [facility] OR;  Service: General;  Laterality: N/A;    Family History  Problem Relation Age of Onset  . Hypertension Mother   . Stroke Mother   . Hyperlipidemia Mother   . Diabetes Sister   . Heart disease Sister        Before age 50  . Hypertension Sister   . Hyperlipidemia Sister   . Heart attack Sister   . Cancer Father        Lung  . Hypertension Son   . Colon cancer Neg Hx   . Prostate cancer Neg Hx     Social History   Socioeconomic History  . Marital status: Married    Spouse name: Precious Bard  . Number of children: 1  . Years of education: Not on file  . Highest education level: Not on file  Occupational History  . Occupation: retired  Scientific laboratory technician  . Financial resource strain: Not on file  . Food insecurity    Worry: Not on file    Inability: Not on file  .  Transportation needs    Medical: Not on file    Non-medical: Not on file  Tobacco Use  . Smoking status: Former Smoker    Packs/day: 2.00    Years: 40.00    Pack years: 80.00    Types: Cigarettes    Quit date: 05/06/2004    Years since quitting: 14.4  . Smokeless tobacco: Never Used  Substance and Sexual Activity  . Alcohol use: Yes    Alcohol/week: 0.0 - 2.0 standard drinks    Comment: beer or wine  occassionally  . Drug use: No  . Sexual activity: Yes    Partners: Female  Lifestyle  . Physical activity    Days per week: Not on file    Minutes per session: Not on file  . Stress: Not on file  Relationships  . Social Herbalist on phone: Not on file    Gets together: Not on file    Attends religious service: Not on file    Active member of club or organization: Not on file    Attends meetings of clubs or organizations: Not on file    Relationship status: Not on file  . Intimate partner violence    Fear of current or ex partner: Not on file    Emotionally abused: Not on file    Physically abused: Not on file    Forced sexual activity: Not on file  Other Topics Concern  . 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        Review of systems: Review of Systems as per HPI All other systems reviewed and are negative.   Physical Exam: Vitals were not taken and physical exam was not performed during this virtual visit.  Data Reviewed:  Reviewed labs, radiology imaging, old records and pertinent past GI work up   Assessment and Plan/Recommendations:  73 year old male with type 2 diabetes, pancreatitis, CAD, hypertension, chronic GERD with distal esophageal stricture, recent episode of food impaction Schedule for EGD with dilation of distal esophageal stricture The risks and benefits as well as alternatives of endoscopic procedure(s) have been discussed and reviewed. All questions answered. The patient agrees to  proceed.  Advised patient to continue to avoid dense bread, meat and raw vegetables Soft diet,  bite size pieces Continue omeprazole and antireflux measures   K. Denzil Magnuson , MD   CC: Tonia Ghent, MD

## 2018-10-26 ENCOUNTER — Ambulatory Visit (INDEPENDENT_AMBULATORY_CARE_PROVIDER_SITE_OTHER): Payer: Medicare Other | Admitting: Family Medicine

## 2018-10-26 ENCOUNTER — Ambulatory Visit (INDEPENDENT_AMBULATORY_CARE_PROVIDER_SITE_OTHER): Payer: Medicare Other | Admitting: *Deleted

## 2018-10-26 DIAGNOSIS — I255 Ischemic cardiomyopathy: Secondary | ICD-10-CM

## 2018-10-26 DIAGNOSIS — I442 Atrioventricular block, complete: Secondary | ICD-10-CM

## 2018-10-26 DIAGNOSIS — R1310 Dysphagia, unspecified: Secondary | ICD-10-CM

## 2018-10-26 DIAGNOSIS — E1159 Type 2 diabetes mellitus with other circulatory complications: Secondary | ICD-10-CM

## 2018-10-26 DIAGNOSIS — R1319 Other dysphagia: Secondary | ICD-10-CM

## 2018-10-26 LAB — CUP PACEART REMOTE DEVICE CHECK
Battery Remaining Longevity: 25 mo
Battery Remaining Percentage: 28 %
Battery Voltage: 2.86 V
Brady Statistic AP VP Percent: 11 %
Brady Statistic AP VS Percent: 1 %
Brady Statistic AS VP Percent: 89 %
Brady Statistic AS VS Percent: 1 %
Brady Statistic RA Percent Paced: 11 %
Date Time Interrogation Session: 20200621072447
HighPow Impedance: 80 Ohm
HighPow Impedance: 80 Ohm
Implantable Lead Implant Date: 20140313
Implantable Lead Implant Date: 20140313
Implantable Lead Implant Date: 20140313
Implantable Lead Location: 753858
Implantable Lead Location: 753859
Implantable Lead Location: 753860
Implantable Pulse Generator Implant Date: 20140313
Lead Channel Impedance Value: 430 Ohm
Lead Channel Impedance Value: 460 Ohm
Lead Channel Impedance Value: 930 Ohm
Lead Channel Pacing Threshold Amplitude: 0.75 V
Lead Channel Pacing Threshold Amplitude: 0.875 V
Lead Channel Pacing Threshold Amplitude: 1 V
Lead Channel Pacing Threshold Pulse Width: 0.5 ms
Lead Channel Pacing Threshold Pulse Width: 0.5 ms
Lead Channel Pacing Threshold Pulse Width: 0.6 ms
Lead Channel Sensing Intrinsic Amplitude: 11.9 mV
Lead Channel Sensing Intrinsic Amplitude: 4.2 mV
Lead Channel Setting Pacing Amplitude: 2 V
Lead Channel Setting Pacing Amplitude: 2 V
Lead Channel Setting Pacing Amplitude: 2.5 V
Lead Channel Setting Pacing Pulse Width: 0.5 ms
Lead Channel Setting Pacing Pulse Width: 0.6 ms
Lead Channel Setting Sensing Sensitivity: 0.5 mV
Pulse Gen Serial Number: 7097007

## 2018-10-26 MED ORDER — INSULIN GLARGINE 100 UNIT/ML ~~LOC~~ SOLN: 30.0000 [IU] | Freq: Every day | SUBCUTANEOUS | 12 refills | Status: DC

## 2018-10-26 MED ORDER — ACCU-CHEK AVIVA VI STRP: ORAL_STRIP | 12 refills | Status: DC

## 2018-10-26 NOTE — Progress Notes (Signed)
Virtual visit completed through WebEx or similar program Patient location: home  Provider location: Financial controller at [facility], office   Pandemic considerations d/w pt.   Limitations and rationale for visit method d/w patient.  Patient agreed to proceed.   CC: f/u  HPI: Diabetes:  Using medications without difficulties: yes Hypoglycemic episodes: rarely, cautions d/w pt.   Hyperglycemic episodes:no Feet problems: he has callous and he is going to see podiatry.   Blood Sugars averaging: usually ~130, with variable/occ higher readings recently.   eye exam within last year: yes He is going get a treadmill set up at home since he can't go to the gym.    He'll call [place] [facility] about getting labs done.    BP 128/73 on home check.  He has history of whitecoat hypertension.  His blood pressures are usually better at home.  He has f/u endoscopy pending.  His swallowing is better.  He is careful with eating.    Meds and allergies reviewed.   ROS: Per HPI unless specifically indicated in ROS section   NAD Speech wnl  A/P:  Dysphagia per GI.  I will await follow-up notes.  Routine cautions given.  He agrees.  DM.  Recheck in about 4 months at [facility] visit.  He'll call about labs at [facility] and we'll go from there.  No change in meds in the meantime.  He agrees.   See above about exercise.  Diet discussed.

## 2018-10-28 ENCOUNTER — Other Ambulatory Visit (INDEPENDENT_AMBULATORY_CARE_PROVIDER_SITE_OTHER): Payer: Medicare Other

## 2018-10-28 DIAGNOSIS — E1159 Type 2 diabetes mellitus with other circulatory complications: Secondary | ICD-10-CM

## 2018-10-28 LAB — BASIC METABOLIC PANEL
BUN: 22 mg/dL (ref 6–23)
CO2: 28 mEq/L (ref 19–32)
Calcium: 9.8 mg/dL (ref 8.4–10.5)
Chloride: 102 mEq/L (ref 96–112)
Creatinine, Ser: 1.14 mg/dL (ref 0.40–1.50)
GFR: 63.13 mL/min (ref >60.00)
Glucose, Bld: 182 mg/dL — ABNORMAL HIGH (ref 70–99)
Potassium: 4.3 mEq/L (ref 3.5–5.1)
Sodium: 138 mEq/L (ref 135–145)

## 2018-10-28 LAB — HEMOGLOBIN A1C: Hgb A1c MFr Bld: 7.7 % — ABNORMAL HIGH (ref 4.6–6.5)

## 2018-10-28 NOTE — Assessment & Plan Note (Signed)
See above

## 2018-10-29 LAB — CUP PACEART REMOTE DEVICE CHECK
Battery Remaining Longevity: 25 mo
Battery Remaining Percentage: 28 %
Battery Voltage: 2.86 V
Brady Statistic AP VP Percent: 11 %
Brady Statistic AP VS Percent: 1 %
Brady Statistic AS VP Percent: 89 %
Brady Statistic AS VS Percent: 1 %
Brady Statistic RA Percent Paced: 11 %
Brady Statistic RV Percent Paced: 99 %
Date Time Interrogation Session: 20200624145524
HighPow Impedance: 82 Ohm
HighPow Impedance: 82 Ohm
Implantable Lead Implant Date: 20140313
Implantable Lead Implant Date: 20140313
Implantable Lead Implant Date: 20140313
Implantable Lead Location: 753858
Implantable Lead Location: 753859
Implantable Lead Location: 753860
Implantable Pulse Generator Implant Date: 20140313
Lead Channel Impedance Value: 430 Ohm
Lead Channel Impedance Value: 450 Ohm
Lead Channel Impedance Value: 930 Ohm
Lead Channel Pacing Threshold Amplitude: 0.875 V
Lead Channel Pacing Threshold Amplitude: 1 V
Lead Channel Pacing Threshold Pulse Width: 0.5 ms
Lead Channel Pacing Threshold Pulse Width: 0.6 ms
Lead Channel Sensing Intrinsic Amplitude: 11.9 mV
Lead Channel Sensing Intrinsic Amplitude: 4.3 mV
Lead Channel Setting Pacing Amplitude: 2 V
Lead Channel Setting Pacing Amplitude: 2 V
Lead Channel Setting Pacing Amplitude: 2.5 V
Lead Channel Setting Pacing Pulse Width: 0.5 ms
Lead Channel Setting Pacing Pulse Width: 0.6 ms
Lead Channel Setting Sensing Sensitivity: 0.5 mV
Pulse Gen Serial Number: 7097007

## 2018-10-30 ENCOUNTER — Telehealth: Payer: Self-pay | Admitting: Gastroenterology

## 2018-10-30 NOTE — Telephone Encounter (Signed)

## 2018-11-02 ENCOUNTER — Encounter: Payer: Self-pay | Admitting: Gastroenterology

## 2018-11-02 ENCOUNTER — Other Ambulatory Visit: Payer: Self-pay

## 2018-11-02 ENCOUNTER — Ambulatory Visit (AMBULATORY_SURGERY_CENTER): Payer: Medicare Other | Admitting: Gastroenterology

## 2018-11-02 VITALS — BP 122/68 | HR 60 | Temp 98.4°F | Resp 14 | Ht 74.0 in | Wt 190.0 lb

## 2018-11-02 DIAGNOSIS — K222 Esophageal obstruction: Secondary | ICD-10-CM | POA: Diagnosis not present

## 2018-11-02 DIAGNOSIS — R1310 Dysphagia, unspecified: Secondary | ICD-10-CM

## 2018-11-02 MED ORDER — SODIUM CHLORIDE 0.9 % IV SOLN: 500.0000 mL | Freq: Once | INTRAVENOUS | Status: DC

## 2018-11-02 NOTE — Progress Notes (Signed)
[facility] took temp and vital signs.

## 2018-11-02 NOTE — Progress Notes (Signed)
A/ox3, pleased with MAC, report to RN 

## 2018-11-02 NOTE — Progress Notes (Signed)
Remote ICD transmission.   

## 2018-11-02 NOTE — Progress Notes (Signed)
Called to room to assist during endoscopic procedure.  Patient ID and intended procedure confirmed with present staff. Received instructions for my participation in the procedure from the performing physician.  

## 2018-11-02 NOTE — Progress Notes (Signed)
Pt's states no medical or surgical changes since previsit or office visit. 

## 2018-11-02 NOTE — Patient Instructions (Addendum)
Handouts given for stricture and post dilation diet.  YOU HAD AN ENDOSCOPIC PROCEDURE TODAY AT [facility]:   Refer to the procedure report that was given to you for any specific questions about what was found during the examination.  If the procedure report does not answer your questions, please call your gastroenterologist to clarify.  If you requested that your care partner not be given the details of your procedure findings, then the procedure report has been included in a sealed envelope for you to review at your convenience later.  YOU SHOULD EXPECT: Some feelings of bloating in the abdomen. Passage of more gas than usual.  Walking can help get rid of the air that was put into your GI tract during the procedure and reduce the bloating. If you had a lower endoscopy (such as a colonoscopy or flexible sigmoidoscopy) you may notice spotting of blood in your stool or on the toilet paper. If you underwent a bowel prep for your procedure, you may not have a normal bowel movement for a few days.  Please Note:  You might notice some irritation and congestion in your nose or some drainage.  This is from the oxygen used during your procedure.  There is no need for concern and it should clear up in a day or so.  SYMPTOMS TO REPORT IMMEDIATELY:   Following upper endoscopy (EGD)  Vomiting of blood or coffee ground material  New chest pain or pain under the shoulder blades  Painful or persistently difficult swallowing  New shortness of breath  Fever of 100F or higher  Black, tarry-looking stools  For urgent or emergent issues, a gastroenterologist can be reached at any hour by calling [phone].   DIET:  SEE POST DILATION DIET HANDOUT.  We do recommend a small meal at first, but then you may proceed to your regular diet.  Drink plenty of fluids but you should avoid alcoholic beverages for 24 hours.  ACTIVITY:  You should plan to take it easy for the rest of today and you should  NOT DRIVE or use heavy machinery until tomorrow (because of the sedation medicines used during the test).    FOLLOW UP: Our staff will call the number listed on your records 48-72 hours following your procedure to check on you and address any questions or concerns that you may have regarding the information given to you following your procedure. If we do not reach you, we will leave a message.  We will attempt to reach you two times.  During this call, we will ask if you have developed any symptoms of COVID 19. If you develop any symptoms (ie: fever, flu-like symptoms, shortness of breath, cough etc.) before then, please call [phone].  If you test positive for Covid 19 in the 2 weeks post procedure, please call and report this information to [place].    If any biopsies were taken you will be contacted by phone or by letter within the next 1-3 weeks.  Please call us at [phone] if you have not heard about the biopsies in 3 weeks.    SIGNATURES/CONFIDENTIALITY: You and/or your care partner have signed paperwork which will be entered into your electronic medical record.  These signatures attest to the fact that that the information above on your After Visit Summary has been reviewed and is understood.  Full responsibility of the confidentiality of this discharge information lies with you and/or your care-partner.

## 2018-11-02 NOTE — Op Note (Signed)
[facility] Patient Name: Luke [name] Procedure Date: 11/02/2018 7:35 AM MRN: [id number] Endoscopist: Mauri Pole , MD Age: 73 Referring MD:  Date of Birth: 1945/06/17 Gender: Male Account #: [id number] Procedure:                Upper GI endoscopy Indications:              Dysphagia Medicines:                Monitored Anesthesia Care Procedure:                Pre-Anesthesia Assessment:                           - Prior to the procedure, a History and Physical                            was performed, and patient medications and                            allergies were reviewed. The patient's tolerance of                            previous anesthesia was also reviewed. The risks                            and benefits of the procedure and the sedation                            options and risks were discussed with the patient.                            All questions were answered, and informed consent                            was obtained. Prior Anticoagulants: The patient has                            taken no previous anticoagulant or antiplatelet                            agents. ASA Grade Assessment: III - A patient with                            severe systemic disease. After reviewing the risks                            and benefits, the patient was deemed in                            satisfactory condition to undergo the procedure.                           After obtaining informed consent, the endoscope was  passed under direct vision. Throughout the                            procedure, the patient's blood pressure, pulse, and                            oxygen saturations were monitored continuously. The                            Endoscope was introduced through the mouth, and                            advanced to the second part of duodenum. The upper                            GI endoscopy was accomplished without  difficulty.                            The patient tolerated the procedure well. Scope In: Scope Out: Findings:                 One benign-appearing, intrinsic moderate                            (circumferential scarring or stenosis; an endoscope                            may pass) stenosis was found 25 to 26 cm from the                            incisors. This stenosis measured 1.4 cm (inner                            diameter) x less than one cm (in length). The                            stenosis was traversed. A TTS dilator was passed                            through the scope. Dilation with a 15-16.5-18 mm                            balloon dilator was performed to 18 mm. The                            dilation site was examined following endoscope                            reinsertion and showed mild mucosal disruption. The                            scope was withdrawn. Dilation was performed with a  Maloney dilator with no resistance at 48 Fr. The                            dilation site was examined following endoscope                            reinsertion and showed mild mucosal disruption.                           The stomach was normal.                           The examined duodenum was normal. Complications:            No immediate complications. Estimated blood loss:                            None. Impression:               - Benign-appearing esophageal stenosis. Dilated.                           - Normal stomach.                           - Normal examined duodenum.                           - No specimens collected. Recommendation:           - Patient has a contact number available for                            emergencies. The signs and symptoms of potential                            delayed complications were discussed with the                            patient. Return to normal activities tomorrow.                             Written discharge instructions were provided to the                            patient.                           - Resume previous diet.                           - Continue present medications.                           - Return to GI office in 2 months.                           - Follow an antireflux regimen.                           -  Continuie Omeprazole Mauri Pole, MD 11/02/2018 8:13:09 AM This report has been signed electronically.

## 2018-11-04 ENCOUNTER — Telehealth: Payer: Self-pay | Admitting: *Deleted

## 2018-11-04 NOTE — Telephone Encounter (Signed)
  Follow up Call-  Call back number 11/02/2018 06/17/2018 03/23/2018 01/29/2018  Post procedure Call Back phone  # [phone]  Permission to leave phone message Yes Yes Yes Yes  Some recent data might be hidden     Patient questions:  Do you have a fever, pain , or abdominal swelling? No. Pain Score  0 *  Have you tolerated food without any problems? Yes.    Have you been able to return to your normal activities? Yes.    Do you have any questions about your discharge instructions: Diet   No. Medications  No. Follow up visit  No.  Do you have questions or concerns about your Care? No.  Actions: * If pain score is 4 or above: No action needed, pain <4.   1. Have you developed a fever since your procedure? no  2.   Have you had an respiratory symptoms (SOB or cough) since your procedure? no  3.   Have you tested positive for COVID 19 since your procedure no  4.   Have you had any family members/close contacts diagnosed with the COVID 19 since your procedure?  no  If yes to any of these questions please route to Joylene John, RN and Alphonsa Gin, Therapist, sports.

## 2018-11-14 ENCOUNTER — Other Ambulatory Visit: Payer: Self-pay | Admitting: Family Medicine

## 2018-11-20 ENCOUNTER — Telehealth: Payer: Self-pay | Admitting: Cardiology

## 2018-11-20 NOTE — Telephone Encounter (Signed)

## 2018-11-23 ENCOUNTER — Ambulatory Visit (INDEPENDENT_AMBULATORY_CARE_PROVIDER_SITE_OTHER): Payer: Medicare Other | Admitting: Cardiology

## 2018-11-23 ENCOUNTER — Other Ambulatory Visit: Payer: Self-pay

## 2018-11-23 ENCOUNTER — Encounter: Payer: Self-pay | Admitting: Cardiology

## 2018-11-23 VITALS — BP 160/80 | HR 72 | Ht 74.0 in | Wt 195.0 lb

## 2018-11-23 DIAGNOSIS — I739 Peripheral vascular disease, unspecified: Secondary | ICD-10-CM

## 2018-11-23 DIAGNOSIS — I255 Ischemic cardiomyopathy: Secondary | ICD-10-CM

## 2018-11-23 DIAGNOSIS — Z95810 Presence of automatic (implantable) cardiac defibrillator: Secondary | ICD-10-CM | POA: Diagnosis not present

## 2018-11-23 DIAGNOSIS — I447 Left bundle-branch block, unspecified: Secondary | ICD-10-CM

## 2018-11-23 DIAGNOSIS — I714 Abdominal aortic aneurysm, without rupture, unspecified: Secondary | ICD-10-CM

## 2018-11-23 NOTE — Progress Notes (Signed)
Cardiology Office Note:    Date:  11/23/2018   ID:  SEM MCCAUGHEY, DOB [birth year], MRN [id number]  PCP:  Tonia Ghent, MD  Cardiologist:  No primary care provider on file.  Electrophysiologist:  None   Referring MD: Tonia Ghent, MD   CAD follow-up  History of Present Illness:    Luke [name] is a 73 y.o. male CRT-ICD after intermittent complete heart block in the setting of ischemic cardiomyopathy and left bundle branch block who developed pancreatitis on lisinopril.  Goes by [name].  Former Dr. [name] patient.  [facility] 2013 - 2V CAD Left anterior descending (LAD): focal 70-80% proximal. Long 40% mid vessel. Right coronary artery (RCA): Diffusely diseased. 50-60% mid vessel, 70% distal, 70% at bifurcation of PDA/PLOM.  LV Ejection Fraction: 30%. He underwent catheterization which confirmed severe LV dysfunction with moderate to severe 2 vessel disease not thought to be appropriate for intervention.  Repeat echocardiogram in 2019 showed normal EF.  Bought a treadmill, half the way to [place].   Past Medical History:  Diagnosis Date  . AAA (abdominal aortic aneurysm) (Luke [name]) 2011   Per vascular surgery  . Anxiety    no med. in use for anxiety but pt. speaks openly of his stress & anxious feelings regarding impending surgery    . Atrial fibrillation (Luke [name])   . Automatic implantable cardioverter-defibrillator in situ   . CAD (coronary artery disease)    Presumed CAD with nuclear scan October 09, 2011,  large anteroseptal MI and inferior MI. Catheterization scheduled October 15, 2011  . Cardiomyopathy [facility])    Nuclear, October 09, 2011, EF 30%, multiple focal wall motion abnormalities  . Diabetes mellitus    type II  . Drug therapy    Hyperkalemia with spironolactone  . Ejection fraction < 50%    EF 30%, nuclear, October 09, 2011  . Ejection fraction < 50%    EF previously 30%  //   EF 30-35%, echo, July 14, 2012, severe diffuse hypokinesis, PA pressure 43 mm mercury  . Fall due to ice or  snow Feb.  19, 2015  . HLD (hyperlipidemia)   . Hypertension    white coat HTN-- often elevated in office and controlled on outside checks.  . ICD (implantable cardioverter-defibrillator) in place    CRT-D placed March, 2014 complete heart block and k dysfunction  . LBBB (left bundle branch block)    LBBB on EKG October 11, 2011,  no prior EKG has been done  . Low testosterone    Hx of  . Myocardial infarction (Luke [name])   . Pacemaker   . PAD (peripheral artery disease) ([place])   . Pancreatitis     Past Surgical History:  Procedure Laterality Date  . ABDOMINAL AORTAGRAM N/A 10/28/2011   Procedure: ABDOMINAL [name];  Surgeon: Angelia Mould, MD;  Location: [facility] CATH LAB;  Service: Cardiovascular;  Laterality: N/A;  . ABDOMINAL AORTIC ANEURYSM REPAIR     EVAR   . BI-VENTRICULAR IMPLANTABLE CARDIOVERTER DEFIBRILLATOR N/A 07/16/2012   Procedure: BI-VENTRICULAR IMPLANTABLE CARDIOVERTER DEFIBRILLATOR  (CRT-D);  Surgeon: Deboraha Sprang, MD;  Location: [facility] CATH LAB;  Service: Cardiovascular;  Laterality: N/A;  . CARDIAC CATHETERIZATION    . COLONOSCOPY    . ESOPHAGOGASTRODUODENOSCOPY (EGD) WITH PROPOFOL N/A 02/05/2018   Procedure: ESOPHAGOGASTRODUODENOSCOPY (EGD) WITH PROPOFOL;  Surgeon: Mauri Pole, MD;  Location: [facility] ENDOSCOPY;  Service: Endoscopy;  Laterality: N/A;  . ESOPHAGOGASTRODUODENOSCOPY (EGD) WITH PROPOFOL N/A 09/27/2018   Procedure: ESOPHAGOGASTRODUODENOSCOPY (EGD) WITH PROPOFOL;  Surgeon: Carol Ada, MD;  Location: [place] [facility];  Service: Endoscopy;  Laterality: N/A;  . FOREIGN BODY REMOVAL  09/27/2018   Procedure: FOREIGN BODY REMOVAL;  Surgeon: Carol Ada, MD;  Location: [facility] END[facility]COPY;  Service: Endoscopy;;  . HERNIA REPAIR  Jan. 9, 2015  . INSERTION OF MESH N/A 05/14/2013   Procedure: INSERTION OF MESH;  Surgeon: Adin Hector, MD;  Location: [place];  Service: General;  Laterality: N/A;  . LOWER EXTREMITY ANGIOGRAM Bilateral 10/28/2011   Procedure: LOWER EXTREMITY  ANGIOGRAM;  Surgeon: Angelia Mould, MD;  Location: [facility] CATH LAB;  Service: Cardiovascular;  Laterality: Bilateral;  . PACEMAKER INSERTION  07-16-12   pacemaker/defibrilator  . POSTERIOR CERVICAL FUSION/FORAMINOTOMY N/A 06/09/2014   Procedure: Laminectomy - Cervical two-Cervcial four posterior cervical instrumented fusion Cervical two-cervical four;  Surgeon: Eustace Moore, MD;  Location: [facility] NEURO ORS;  Service: Neurosurgery;  Laterality: N/A;  posterior   . SAVORY DILATION N/A 02/05/2018   Procedure: SAVORY DILATION;  Surgeon: Mauri Pole, MD;  Location: [facility];  Service: Endoscopy;  Laterality: N/A;  . TONSILLECTOMY     as a child   . UMBILICAL HERNIA REPAIR N/A 05/14/2013   Procedure: LAPAROSCOPIC exploration and repair of hernia in abdominal ;  Surgeon: Adin Hector, MD;  Location: [place];  Service: General;  Laterality: N/A;    Current Medications: Current Meds  Medication Sig  . amLODipine (NORVASC) 10 MG tablet Take 1 tablet (10 mg total) by mouth daily.  Marland Kitchen aspirin EC 81 MG tablet Take 81 mg by mouth daily.  . carvedilol (COREG) 25 MG tablet Take 25 mg by mouth 2 (two) times daily with a meal.  . Cholecalciferol (VITAMIN D PO) Take 1,200 Units by mouth daily.   Marland Kitchen glipiZIDE (GLUCOTROL) 10 MG tablet Take 20 mg by mouth 2 (two) times daily.   Marland Kitchen glucose blood (ACCU-CHEK AVIVA) test strip Use up to 2 stips a day, Insulin treated diabetes.  . hydrALAZINE (APRESOLINE) 25 MG tablet TAKE 1 TABLET(25 MG) BY MOUTH THREE TIMES DAILY  . insulin glargine (LANTUS) 100 UNIT/ML injection Inject 0.3-0.35 mLs (30-35 Units total) into the skin daily. Dispense 5 pens  . metFORMIN (GLUCOPHAGE) 500 MG tablet Take 1,000 mg by mouth 2 (two) times daily with a meal.  . Multiple Vitamin (MULTIVITAMIN) tablet Take 1 tablet by mouth daily.   Marland Kitchen omeprazole (PRILOSEC) 40 MG capsule TAKE 1 CAPSULE(40 MG) BY MOUTH TWICE DAILY BEFORE A MEAL  . simvastatin (ZOCOR) 40 MG tablet Take 1 tablet (40 mg  total) by mouth at bedtime.  . vitamin B-12 (CYANOCOBALAMIN) 500 MCG tablet Take 500 mcg by mouth daily.     Allergies:   Aldactone [spironolactone], Codeine, Januvia [sitagliptin], and Lisinopril   Social History   Socioeconomic History  . Marital status: Married    Spouse name: Precious Bard  . Number of children: 1  . Years of education: Not on file  . Highest education level: Not on file  Occupational History  . Occupation: retired  Scientific laboratory technician  . Financial resource strain: Not on file  . Food insecurity    Worry: Not on file    Inability: Not on file  . Transportation needs    Medical: Not on file    Non-medical: Not on file  Tobacco Use  . Smoking status: Former Smoker    Packs/day: 2.00    Years: 40.00    Pack years: 80.00    Types: Cigarettes    Quit date:  05/06/2004    Years since quitting: 14.5  . Smokeless tobacco: Never Used  Substance and Sexual Activity  . Alcohol use: Yes    Alcohol/week: 0.0 - 2.0 standard drinks    Comment: beer or wine  occassionally  . Drug use: No  . Sexual activity: Yes    Partners: Female  Lifestyle  . Physical activity    Days per week: Not on file    Minutes per session: Not on file  . Stress: Not on file  Relationships  . Social Herbalist on phone: Not on file    Gets together: Not on file    Attends religious service: Not on file    Active member of club or organization: Not on file    Attends meetings of clubs or organizations: Not on file    Relationship status: Not on file  Other Topics Concern  . 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       Family History: The patient's family history includes Cancer in his father; Diabetes in his sister; Heart attack in his sister; Heart disease in his sister; Hyperlipidemia in his mother and sister; Hypertension in his mother, sister, and son; Stroke in his mother. There is no history of Colon cancer, Prostate cancer, Esophageal  cancer, Stomach cancer, or Rectal cancer.  ROS:   Please see the history of present illness.    No fever chills nausea vomiting syncope bleeding all other systems reviewed and are negative.  EKGs/Labs/Other Studies Reviewed:    The following studies were reviewed today: Echo-50%  EKG:  EKG is not ordered today.    Recent Labs: 02/12/2018: ALT 17; TSH 2.75 09/27/2018: Hemoglobin 14.6; Platelets 268 10/28/2018: BUN 22; Creatinine, Ser 1.14; Potassium 4.3; Sodium 138  Recent Lipid Panel    Component Value Date/Time   CHOL 138 02/12/2018 1301   TRIG 342.0 (H) 02/12/2018 1301   HDL 34.50 (L) 02/12/2018 1301   CHOLHDL 4 02/12/2018 1301   VLDL 68.4 (H) 02/12/2018 1301   LDLCALC 63 11/11/2017 0127   LDLDIRECT 70.0 02/12/2018 1301    Physical Exam:    VS:  BP (!) 160/80   Pulse 72   Ht 6\' 2"  (1.88 m)   Wt 195 lb (88.5 kg)   SpO2 96%   BMI 25.04 kg/m     Wt Readings from Last 3 Encounters:  11/23/18 195 lb (88.5 kg)  11/02/18 190 lb (86.2 kg)  10/22/18 190 lb (86.2 kg)     GEN:  Well nourished, well developed in no acute distress HEENT: Normal NECK: No JVD; No carotid bruits LYMPHATICS: No lymphadenopathy CARDIAC: RRR, no murmurs, rubs, gallops RESPIRATORY:  Clear to auscultation without rales, wheezing or rhonchi  ABDOMEN: Soft, non-tender, non-distended MUSCULOSKELETAL:  No edema; No deformity  SKIN: Warm and dry NEUROLOGIC:  Alert and oriented x 3 PSYCHIATRIC:  Normal affect   ASSESSMENT:    1. Ischemic cardiomyopathy   2. Biventricular ICD (implantable cardioverter-defibrillator) in place   3. LBBB (left bundle branch block)   4. PVD (peripheral vascular disease) ([place])   5. AAA (abdominal aortic aneurysm) without rupture (H[facility])    PLAN:    In order of problems listed above:  CAD/ischemic cardiomyopathy - Proximal LAD lesion as well as diffusely diseased RCA.  No intervention performed, medical management.  EF has improved from 30 to low normal.  Chronic  resynchronization therapy/ICD/left bundle branch block -Dr.  Caryl Comes.  No ACE inhibitor because of pancreatitis.  No spironolactone because of hyperkalemia carvedilol at goal.  Peripheral vascular disease with diabetes -Walking on a treadmill quite a bit.  Both legs is improved.  No claudication.-Working with primary team secondary prevention.  Very pleased with his walking.  AAA repair -2013-vascular follow-up.  Doing very well.  Medication Adjustments/Labs and Tests Ordered: Current medicines are reviewed at length with the patient today.  Concerns regarding medicines are outlined above.  No orders of the defined types were placed in this encounter.  No orders of the defined types were placed in this encounter.   Patient Instructions  Medication Instructions:  The current medical regimen is effective;  continue present plan and medications.  If you need a refill on your cardiac medications before your next appointment, please call your pharmacy.   Follow-Up: At [facility], you and your health needs are our priority.  As part of our continuing mission to provide you with exceptional heart care, we have created designated Provider Care Teams.  These Care Teams include your primary Cardiologist (physician) and Advanced Practice Providers (APPs -  Physician Assistants and Nurse Practitioners) who all work together to provide you with the care you need, when you need it. You will need a follow up appointment in 12 months.  Please call our office 2 months in advance to schedule this appointment.  You may see Dr Candee Furbish. or one of the following Advanced Practice Providers on your designated Care Team:   Truitt Merle, NP Cecilie Kicks, NP . Kathyrn Drown, NP  Thank you for choosing [facility]!!         Signed, Candee Furbish, MD  11/23/2018 2:59 PM    [place]

## 2018-11-23 NOTE — Patient Instructions (Signed)
Medication Instructions:  The current medical regimen is effective;  continue present plan and medications.  If you need a refill on your cardiac medications before your next appointment, please call your pharmacy.   Follow-Up: At [facility], you and your health needs are our priority.  As part of our continuing mission to provide you with exceptional heart care, we have created designated Provider Care Teams.  These Care Teams include your primary Cardiologist (physician) and Advanced Practice Providers (APPs -  Physician Assistants and Nurse Practitioners) who all work together to provide you with the care you need, when you need it. You will need a follow up appointment in 12 months.  Please call our office 2 months in advance to schedule this appointment.  You may see Dr Mark Skains. or one of the following Advanced Practice Providers on your designated Care Team:   Lori Gerhardt, NP Laura Ingold, NP . Jill McDaniel, NP  Thank you for choosing [facility]!!      

## 2018-12-11 ENCOUNTER — Other Ambulatory Visit: Payer: Self-pay

## 2018-12-22 ENCOUNTER — Other Ambulatory Visit: Payer: Self-pay | Admitting: Family Medicine

## 2018-12-22 DIAGNOSIS — H353131 Nonexudative age-related macular degeneration, bilateral, early dry stage: Secondary | ICD-10-CM | POA: Diagnosis not present

## 2018-12-22 DIAGNOSIS — E113292 Type 2 diabetes mellitus with mild nonproliferative diabetic retinopathy without macular edema, left eye: Secondary | ICD-10-CM | POA: Diagnosis not present

## 2019-01-13 ENCOUNTER — Other Ambulatory Visit: Payer: Self-pay | Admitting: Family Medicine

## 2019-01-14 ENCOUNTER — Encounter: Payer: Self-pay | Admitting: Gastroenterology

## 2019-01-14 ENCOUNTER — Other Ambulatory Visit: Payer: Self-pay

## 2019-01-14 ENCOUNTER — Ambulatory Visit (INDEPENDENT_AMBULATORY_CARE_PROVIDER_SITE_OTHER): Payer: Medicare Other | Admitting: Gastroenterology

## 2019-01-14 VITALS — BP 150/70 | HR 76 | Temp 98.2°F | Ht 72.5 in | Wt 196.2 lb

## 2019-01-14 DIAGNOSIS — K582 Mixed irritable bowel syndrome: Secondary | ICD-10-CM

## 2019-01-14 DIAGNOSIS — Z86010 Personal history of colonic polyps: Secondary | ICD-10-CM

## 2019-01-14 DIAGNOSIS — K222 Esophageal obstruction: Secondary | ICD-10-CM | POA: Diagnosis not present

## 2019-01-14 DIAGNOSIS — I255 Ischemic cardiomyopathy: Secondary | ICD-10-CM | POA: Diagnosis not present

## 2019-01-14 DIAGNOSIS — K219 Gastro-esophageal reflux disease without esophagitis: Secondary | ICD-10-CM

## 2019-01-14 DIAGNOSIS — R1310 Dysphagia, unspecified: Secondary | ICD-10-CM | POA: Diagnosis not present

## 2019-01-14 NOTE — Patient Instructions (Signed)
You will be due for your colon/EGD in November 2020, we will contact you onc schedule is available  Wean down prilosec to once daily  Lactose free diet trial for 1 week   Take Fiber Choice 1-2 tablets daily as needed   Lactose-Free Diet, Adult If you have lactose intolerance, you are not able to digest lactose. Lactose is a natural sugar found mainly in dairy milk and dairy products. You may need to avoid all foods and beverages that contain lactose. A lactose-free diet can help you do this. Which foods have lactose? Lactose is found in dairy milk and dairy products, such as:  Yogurt.  Cheese.  Butter.  Margarine.  Sour cream.  Cream.  Whipped toppings and nondairy creamers.  Ice cream and other dairy-based desserts. Lactose is also found in foods or products made with dairy milk or milk ingredients. To find out whether a food contains dairy milk or a milk ingredient, look at the ingredients list. Avoid foods with the statement "May contain milk" and foods that contain:  Milk powder.  Whey.  Curd.  Caseinate.  Lactose.  Lactalbumin.  Lactoglobulin. What are alternatives to dairy milk and foods made with milk products?  Lactose-free milk.  Soy milk with added calcium and vitamin D.  Almond milk, coconut milk, rice milk, or other nondairy milk alternatives with added calcium and vitamin D. Note that these are low in protein.  Soy products, such as soy yogurt, soy cheese, soy ice cream, and soy-based sour cream.  Other nut milk products, such as almond yogurt, almond cheese, cashew yogurt, cashew cheese, cashew ice cream, coconut yogurt, and coconut ice cream. What are tips for following this plan?  Do not consume foods, beverages, vitamins, minerals, or medicines containing lactose. Read ingredient lists carefully.  Look for the words "lactose-free" on labels.  Use lactase enzyme drops or tablets as directed by your health care provider.  Use lactose-free  milk or a milk alternative, such as soy milk or almond milk, for drinking and cooking.  Make sure you get enough calcium and vitamin D in your diet. A lactose-free eating plan can be lacking in these important nutrients.  Take calcium and vitamin D supplements as directed by your health care provider. Talk to your health care provider about supplements if you are not able to get enough calcium and vitamin D from food. What foods can I eat?  Fruits All fresh, canned, frozen, or dried fruits that are not processed with lactose. Vegetables All fresh, frozen, and canned vegetables without cheese, cream, or butter sauces. Grains Any that are not made with dairy milk or dairy products. Meats and other proteins Any meat, fish, poultry, and other protein sources that are not made with dairy milk or dairy products. Soy cheese and yogurt. Fats and oils Any that are not made with dairy milk or dairy products. Beverages Lactose-free milk. Soy, rice, or almond milk with added calcium and vitamin D. Fruit and vegetable juices. Sweets and desserts Any that are not made with dairy milk or dairy products. Seasonings and condiments Any that are not made with dairy milk or dairy products. Calcium Calcium is found in many foods that contain lactose and is important for bone health. The amount of calcium you need depends on your age:  Adults younger than 50 years: 1,000 mg of calcium a day.  Adults older than 50 years: 1,200 mg of calcium a day. If you are not getting enough calcium, you may get it  from other sources, including:  Orange juice with calcium added. There are 300-350 mg of calcium in 1 cup of orange juice.  Calcium-fortified soy milk. There are 300-400 mg of calcium in 1 cup of calcium-fortified soy milk.  Calcium-fortified rice or almond milk. There are 300 mg of calcium in 1 cup of calcium-fortified rice or almond milk.  Calcium-fortified breakfast cereals. There are 100-1,000 mg of  calcium in calcium-fortified breakfast cereals.  Spinach, cooked. There are 145 mg of calcium in  cup of cooked spinach.  Edamame, cooked. There are 130 mg of calcium in  cup of cooked edamame.  Collard greens, cooked. There are 125 mg of calcium in  cup of cooked collard greens.  Kale, frozen or cooked. There are 90 mg of calcium in  cup of cooked or frozen kale.  Almonds. There are 95 mg of calcium in  cup of almonds.  Broccoli, cooked. There are 60 mg of calcium in 1 cup of cooked broccoli. The items listed above may not be a complete list of recommended foods and beverages. Contact a dietitian for more options. What foods are not recommended? Fruits None, unless they are made with dairy milk or dairy products. Vegetables None, unless they are made with dairy milk or dairy products. Grains Any grains that are made with dairy milk or dairy products. Meats and other proteins None, unless they are made with dairy milk or dairy products. Dairy All dairy products, including milk, goat's milk, buttermilk, kefir, acidophilus milk, flavored milk, evaporated milk, condensed milk, dulce de [place], eggnog, yogurt, cheese, and cheese spreads. Fats and oils Any that are made with milk or milk products. Margarines and salad dressings that contain milk or cheese. Cream. Half and half. Cream cheese. Sour cream. Chip dips made with sour cream or yogurt. Beverages Hot chocolate. Cocoa with lactose. Instant iced teas. Powdered fruit drinks. Smoothies made with dairy milk or yogurt. Sweets and desserts Any that are made with milk or milk products. Seasonings and condiments Chewing gum that has lactose. Spice blends if they contain lactose. Artificial sweeteners that contain lactose. Nondairy creamers. The items listed above may not be a complete list of foods and beverages to avoid. Contact a dietitian for more information. Summary  If you are lactose intolerant, it means that you have a hard  time digesting lactose, a natural sugar found in milk and milk products.  Following a lactose-free diet can help you manage this condition.  Calcium is important for bone health and is found in many foods that contain lactose. Talk with your health care provider about other sources of calcium. This information is not intended to replace advice given to you by your health care provider. Make sure you discuss any questions you have with your health care provider. Document Released: 10/12/2001 Document Revised: 05/20/2017 Document Reviewed: 05/20/2017 Elsevier Patient Education  [place]. choice 1-2 tablets daily as needed

## 2019-01-14 NOTE — Progress Notes (Signed)
[name]    ET:[id number]    12-22-1945  Primary Care Physician:Duncan, Elveria Rising, MD  Referring Physician: Tonia Ghent, MD [address] [place],  [place] [address]   Chief complaint: Dysphagia, irregular bowel habits HPI: 73 year old male with history of GERD, esophageal stricture here for follow-up visit  He had 2 episodes of food getting hung up in his throat since EGD November 02, 2018, once with a piece of chicken and once with semi-cooked potato, was able to get it down with sips of water.  Denies any food impaction with regurgitation. He is having irregular bowel movements with alternating constipation and diarrhea every 3 to 4 days.  Denies any rectal bleeding or melena.  He has excessive bloating and flatulence.  EGD November 02, 2018: Proximal esophageal stricture dilated with TTS balloon from 15 to 18 mm, subsequently passed 48 French Maloney dilator with no resistance  EGD 09/27/18 by Dr Benson [place] with disimpaction of food bolus from distal esophagus, benign esophageal stricture approximately 1.2 cm in distal esophagus.  EGD 06/17/18 distal esophageal stenosis approximately 1.3 cm, dilated with TTS balloon to 15.5 mm  EGD 03/23/2018 with proximal esophageal stricture dilated to 13 mm with TTS balloon. Esophageal biopsies negative for eosinophilic esophagitis.  Colonoscopy 03/23/2018 with removal of 16 sessile adenomatous polyps.  He was hospitalized in July 2019 with acute pancreatitis, idiopathic. Possible secondary to medication, ?Ace inhibitor. Repeat CT abdomen pelvis November 2019negative for any mass lesion or IPMN. Showed small volume peripancreatic fluid with resolution of acute pancreatitis.  Outpatient Encounter Medications as of 01/14/2019  Medication Sig  . amLODipine (NORVASC) 10 MG tablet Take 1 tablet (10 mg total) by mouth daily.  Marland Kitchen aspirin EC 81 MG tablet Take 81 mg by mouth daily.  . carvedilol (COREG) 25 MG tablet Take 25 mg by  mouth 2 (two) times daily with a meal.  . Cholecalciferol (VITAMIN D PO) Take 1,200 Units by mouth daily.   . Cyanocobalamin 2500 MCG CHEW Chew 1 tablet by mouth daily.  Marland Kitchen glipiZIDE (GLUCOTROL) 10 MG tablet Take 20 mg by mouth 2 (two) times daily.   Marland Kitchen glucose blood (ACCU-CHEK AVIVA) test strip Use up to 2 stips a day, Insulin treated diabetes.  . hydrALAZINE (APRESOLINE) 25 MG tablet TAKE 1 TABLET(25 MG) BY MOUTH THREE TIMES DAILY  . insulin glargine (LANTUS) 100 UNIT/ML injection Inject 0.3-0.35 mLs (30-35 Units total) into the skin daily. Dispense 5 pens  . metFORMIN (GLUCOPHAGE) 500 MG tablet Take 1,000 mg by mouth 2 (two) times daily with a meal.  . Multiple Vitamin (MULTIVITAMIN) tablet Take 1 tablet by mouth daily.   Marland Kitchen omeprazole (PRILOSEC) 40 MG capsule TAKE 1 CAPSULE(40 MG) BY MOUTH TWICE DAILY BEFORE A MEAL  . simvastatin (ZOCOR) 40 MG tablet TAKE 1 TABLET(40 MG) BY MOUTH AT BEDTIME  . [DISCONTINUED] vitamin B-12 (CYANOCOBALAMIN) 500 MCG tablet Take 500 mcg by mouth daily.   No facility-administered encounter medications on file as of 01/14/2019.     Allergies as of 01/14/2019 - Review Complete 01/14/2019  Allergen Reaction Noted  . Aldactone [spironolactone] Other (See Comments) 04/20/2013  . Codeine Rash and Hives   . Januvia [sitagliptin] Other (See Comments) 11/21/2016  . Lisinopril  11/18/2017    Past Medical History:  Diagnosis Date  . AAA (abdominal aortic aneurysm) ([place]) 2011   Per vascular surgery  . Anxiety    no med. in use for anxiety but  pt. speaks openly of his stress & anxious feelings regarding impending surgery    . Atrial fibrillation ([place])   . Automatic implantable cardioverter-defibrillator in situ   . CAD (coronary artery disease)    Presumed CAD with nuclear scan October 09, 2011,  large anteroseptal MI and inferior MI. Catheterization scheduled October 15, 2011  . Cardiomyopathy [facility])    Nuclear, October 09, 2011, EF 30%, multiple focal wall motion abnormalities   . Diabetes mellitus    type II  . Drug therapy    Hyperkalemia with spironolactone  . Ejection fraction < 50%    EF 30%, nuclear, October 09, 2011  . Ejection fraction < 50%    EF previously 30%  //   EF 30-35%, echo, July 14, 2012, severe diffuse hypokinesis, PA pressure 43 mm mercury  . Fall due to ice or snow Feb.  19, 2015  . HLD (hyperlipidemia)   . Hypertension    white coat HTN-- often elevated in office and controlled on outside checks.  . ICD (implantable cardioverter-defibrillator) in place    CRT-D placed March, 2014 complete heart block and k dysfunction  . LBBB (left bundle branch block)    LBBB on EKG October 11, 2011,  no prior EKG has been done  . Low testosterone    Hx of  . Myocardial infarction ([place])   . Pacemaker   . PAD (peripheral artery disease) ([place])   . Pancreatitis     Past Surgical History:  Procedure Laterality Date  . ABDOMINAL AORTAGRAM N/A 10/28/2011   Procedure: ABDOMINAL [name];  Surgeon: Angelia Mould, MD;  Location[facility] C[facility] LAB;  Service: Cardiovascular;  Laterality: N/A;  . ABDOMINAL AORTIC ANEURYSM REPAIR     EVAR   . BI-VENTRICULAR IMPLANTABLE CARDIOVERTER DEFIBRILLATOR N/A 07/16/2012   Procedure: BI-VENTRICULAR IMPLANTABLE CARDIOVERTER DEFIBRILLATOR  (CRT-D);  Surgeon: Deboraha Sprang, MD;  Location: [facility] CATH LAB;  Service: Cardiovascular;  Laterality: N/A;  . CARDIAC CATHETERIZATION    . COLONOSCOPY    . ESOPHAGOGASTRODUODENOSCOPY (EGD) WITH PROPOFOL N/A 02/05/2018   Procedure: ESOPHAGOGASTRODUODENOSCOPY (EGD) WITH PROPOFOL;  Surgeon: Mauri Pole, MD;  Location: [facility] ENDOSCOPY;  Service: Endoscopy;  Laterality: N/A;  . ESOPHAGOGASTRODUODENOSCOPY (EGD) WITH PROPOFOL N/A 09/27/2018   Procedure: ESOPHAGOGASTRODUODENOSCOPY (EGD) WITH PROPOFOL;  Surgeon: Carol Ada, MD;  Location: [place];  Service: Endoscopy;  Laterality: N/A;  . FOREIGN BODY REMOVAL  09/27/2018   Procedure: FOREIGN BODY REMOVAL;  Surgeon: Carol Ada, MD;   Location: [facility] ENDOSCOPY;  Service: Endoscopy;;  . HERNIA REPAIR  Jan. 9, 2015  . INSERTION OF MESH N/A 05/14/2013   Procedure: INSERTION OF MESH;  Surgeon: Adin Hector, MD;  Location: [place];  Service: General;  Laterality: N/A;  . LOWER EXTREMITY ANGIOGRAM Bilateral 10/28/2011   Procedure: LOWER EXTREMITY ANGIOGRAM;  Surgeon: Angelia Mould, MD;  Location: [facility] CATH LAB;  Service: Cardiovascular;  Laterality: Bilateral;  . PACEMAKER INSERTION  07-16-12   pacemaker/defibrilator  . POSTERIOR CERVICAL FUSION/FORAMINOTOMY N/A 06/09/2014   Procedure: Laminectomy - Cervical two-Cervcial four posterior cervical instrumented fusion Cervical two-cervical four;  Surgeon: Eustace Moore, MD;  Location: [place] NEURO ORS;  Service: Neurosurgery;  Laterality: N/A;  posterior   . SAVORY DILATION N/A 02/05/2018   Procedure: SAVORY DILATION;  Surgeon: Mauri Pole, MD;  Location: [facility] ENDOSCOPY;  Service: Endoscopy;  Laterality: N/A;  . TONSILLECTOMY     as a child   . UMBILICAL HERNIA REPAIR N/A 05/14/2013   Procedure: LAPAROSCOPIC exploration and  repair of hernia in abdominal ;  Surgeon: Adin Hector, MD;  Location[facility] OR;  Service: General;  Laterality: N/A;    Family History  Problem Relation Age of Onset  . Hypertension Mother   . Stroke Mother   . Hyperlipidemia Mother   . Diabetes Sister   . Heart disease Sister        Before age 68  . Hypertension Sister   . Hyperlipidemia Sister   . Heart attack Sister   . Cancer Father        Lung  . Hypertension Son   . Colon cancer Neg Hx   . Prostate cancer Neg Hx   . Esophageal cancer Neg Hx   . Stomach cancer Neg Hx   . Rectal cancer Neg Hx     Social History   Socioeconomic History  . Marital status: Married    Spouse name: Luke [name]  . Number of children: 1  . Years of education: Not on file  . Highest education level: Not on file  Occupational History  . Occupation: retired  Scientific laboratory technician  . Financial resource strain: Not on file  .  Food insecurity    Worry: Not on file    Inability: Not on file  . Transportation needs    Medical: Not on file    Non-medical: Not on file  Tobacco Use  . Smoking status: Former Smoker    Packs/day: 2.00    Years: 40.00    Pack years: 80.00    Types: Cigarettes    Quit date: 05/06/2004    Years since quitting: 14.7  . Smokeless tobacco: Never Used  Substance and Sexual Activity  . Alcohol use: Yes    Alcohol/week: 0.0 - 2.0 standard drinks    Comment: beer or wine  occassionally  . Drug use: No  . Sexual activity: Yes    Partners: Female  Lifestyle  . Physical activity    Days per week: Not on file    Minutes per session: Not on file  . Stress: Not on file  Relationships  . Social Herbalist on phone: Not on file    Gets together: Not on file    Attends religious service: Not on file    Active member of club or organization: Not on file    Attends meetings of clubs or organizations: Not on file    Relationship status: Not on file  . Intimate partner violence    Fear of current or ex partner: Not on file    Emotionally abused: Not on file    Physically abused: Not on file    Forced sexual activity: Not on file  Other Topics Concern  . 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        Review of systems: Review of Systems  Constitutional: Negative for fever and chills.  HENT: Negative.   Eyes: Negative for blurred vision.  Respiratory: Negative for cough, shortness of breath and wheezing.   Cardiovascular: Negative for chest pain and palpitations.  Gastrointestinal: as per HPI Genitourinary: Negative for dysuria, urgency, frequency and hematuria. Positive for urgency. Musculoskeletal: Negative for myalgias, back pain and joint pain.  Skin: Negative for itching and rash.  Neurological: Negative for dizziness, tremors, focal weakness, seizures and loss of consciousness.  Endo/Heme/Allergies: Negative for seasonal  allergies. Positive for easy bleeding/bruising Psychiatric/Behavioral: Negative for depression, suicidal ideas and hallucinations.  All  other systems reviewed and are negative.   Physical Exam: Vitals:   01/14/19 1035  BP: (!) 150/70  Pulse: 76  Temp: 98.2 F (36.8 C)   Body mass index is 26.25 kg/m. Gen:      No acute distress HEENT:  EOMI, sclera anicteric Neck:     No masses; no thyromegaly Lungs:    Clear to auscultation bilaterally; normal respiratory effort CV:         Regular rate and rhythm; no murmurs Abd:      + bowel sounds; soft, non-tender; no palpable masses, no distension Ext:    No edema; adequate peripheral perfusion Skin:      Warm and dry; no rash Neuro: alert and oriented x 3 Psych: normal mood and affect  Data Reviewed:  Reviewed labs, radiology imaging, old records and pertinent past GI work up CT abdomen and pelvis March 25, 2018 1. Since 11/10/2017, improved appearance of the pancreas. Development of atrophy with resolution of peripancreatic edema. There is minimal ill-defined fluid surrounding the neck and body. 2. Aortic atherosclerosis (ICD10-I70.0) and emphysema (ICD10-J43.9). 3. Status post aortic stent graft repair, similar.  Assessment and Plan/Recommendations:  73 year old male with type 2 diabetes, pancreatitis, CAD, hypertension, chronic GERD with esophageal stricture and multiple adenomatous polyps (16) removed on last colonoscopy November 2019 Due for surveillance colonoscopy November 2020 Intermittent dysphagia: Schedule for EGD along with colonoscopy for repeat dilation Esophageal biopsies negative for eosinophilic esophagitis We will try to wean Prilosec to once daily Continue lifestyle modifications and antireflux measures  Pancreatitis, no further episodes.  Follow-up CT showed resolution of pancreatitis and negative for mass lesions in pancreas  Irregular bowel habits likely irritable bowel syndrome with alternating  constipation and diarrhea Start Fiberchoice 1 to 2 tablets daily Trial of lactose-free diet for 1 week  The risks and benefits as well as alternatives of endoscopic procedure(s) have been discussed and reviewed. All questions answered. The patient agrees to proceed.   Damaris Hippo , MD    CC: Luke Ghent, MD

## 2019-01-15 NOTE — Telephone Encounter (Signed)
Electronic refill request. Hydralazine Last office visit:   10/26/2018 Last Filled:    90 tablet 1 11/16/2018  Please advise.

## 2019-01-15 NOTE — Telephone Encounter (Signed)
Sent. Thanks.   

## 2019-01-19 ENCOUNTER — Other Ambulatory Visit: Payer: Self-pay | Admitting: Family Medicine

## 2019-01-26 ENCOUNTER — Ambulatory Visit (INDEPENDENT_AMBULATORY_CARE_PROVIDER_SITE_OTHER): Payer: Medicare Other | Admitting: *Deleted

## 2019-01-26 ENCOUNTER — Other Ambulatory Visit: Payer: Self-pay | Admitting: Family Medicine

## 2019-01-26 DIAGNOSIS — I442 Atrioventricular block, complete: Secondary | ICD-10-CM

## 2019-01-26 DIAGNOSIS — I255 Ischemic cardiomyopathy: Secondary | ICD-10-CM

## 2019-01-26 LAB — CUP PACEART REMOTE DEVICE CHECK
Battery Remaining Longevity: 24 mo
Battery Remaining Percentage: 28 %
Battery Voltage: 2.84 V
Brady Statistic AP VP Percent: 11 %
Brady Statistic AP VS Percent: 1 %
Brady Statistic AS VP Percent: 89 %
Brady Statistic AS VS Percent: 1 %
Brady Statistic RA Percent Paced: 10 %
Date Time Interrogation Session: 20200922131026
HighPow Impedance: 80 Ohm
HighPow Impedance: 80 Ohm
Implantable Lead Implant Date: 20140313
Implantable Lead Implant Date: 20140313
Implantable Lead Implant Date: 20140313
Implantable Lead Location: 753858
Implantable Lead Location: 753859
Implantable Lead Location: 753860
Implantable Pulse Generator Implant Date: 20140313
Lead Channel Impedance Value: 430 Ohm
Lead Channel Impedance Value: 460 Ohm
Lead Channel Impedance Value: 940 Ohm
Lead Channel Pacing Threshold Amplitude: 0.75 V
Lead Channel Pacing Threshold Amplitude: 0.75 V
Lead Channel Pacing Threshold Amplitude: 1 V
Lead Channel Pacing Threshold Pulse Width: 0.5 ms
Lead Channel Pacing Threshold Pulse Width: 0.5 ms
Lead Channel Pacing Threshold Pulse Width: 0.6 ms
Lead Channel Sensing Intrinsic Amplitude: 11.9 mV
Lead Channel Sensing Intrinsic Amplitude: 4.3 mV
Lead Channel Setting Pacing Amplitude: 2 V
Lead Channel Setting Pacing Amplitude: 2 V
Lead Channel Setting Pacing Amplitude: 2.5 V
Lead Channel Setting Pacing Pulse Width: 0.5 ms
Lead Channel Setting Pacing Pulse Width: 0.6 ms
Lead Channel Setting Sensing Sensitivity: 0.5 mV
Pulse Gen Serial Number: 7097007

## 2019-02-03 NOTE — Progress Notes (Signed)
Remote ICD transmission.   

## 2019-02-12 ENCOUNTER — Encounter (HOSPITAL_COMMUNITY): Payer: Self-pay | Admitting: Emergency Medicine

## 2019-02-12 ENCOUNTER — Other Ambulatory Visit: Payer: Self-pay

## 2019-02-12 ENCOUNTER — Emergency Department (HOSPITAL_COMMUNITY): Payer: Medicare Other | Admitting: Anesthesiology

## 2019-02-12 ENCOUNTER — Emergency Department (HOSPITAL_COMMUNITY): Payer: Medicare Other

## 2019-02-12 ENCOUNTER — Ambulatory Visit (HOSPITAL_COMMUNITY): Admit: 2019-02-12 | Payer: Medicare Other | Admitting: Gastroenterology

## 2019-02-12 ENCOUNTER — Telehealth: Payer: Self-pay | Admitting: Gastroenterology

## 2019-02-12 ENCOUNTER — Ambulatory Visit (HOSPITAL_COMMUNITY)
Admission: EM | Admit: 2019-02-12 | Discharge: 2019-02-12 | Disposition: A | Discharge status: Home / Self Care | Payer: Medicare Other | Attending: Emergency Medicine | Admitting: Emergency Medicine

## 2019-02-12 ENCOUNTER — Encounter (HOSPITAL_COMMUNITY)
Admission: EM | Disposition: A | Discharge status: Home / Self Care | Payer: Self-pay | Source: Home / Self Care | Attending: Emergency Medicine

## 2019-02-12 DIAGNOSIS — Z888 Allergy status to other drugs, medicaments and biological substances status: Secondary | ICD-10-CM | POA: Insufficient documentation

## 2019-02-12 DIAGNOSIS — I447 Left bundle-branch block, unspecified: Secondary | ICD-10-CM | POA: Insufficient documentation

## 2019-02-12 DIAGNOSIS — I509 Heart failure, unspecified: Secondary | ICD-10-CM | POA: Diagnosis not present

## 2019-02-12 DIAGNOSIS — Z79899 Other long term (current) drug therapy: Secondary | ICD-10-CM | POA: Diagnosis not present

## 2019-02-12 DIAGNOSIS — T18128A Food in esophagus causing other injury, initial encounter: Secondary | ICD-10-CM

## 2019-02-12 DIAGNOSIS — I10 Essential (primary) hypertension: Secondary | ICD-10-CM

## 2019-02-12 DIAGNOSIS — E1151 Type 2 diabetes mellitus with diabetic peripheral angiopathy without gangrene: Secondary | ICD-10-CM | POA: Diagnosis not present

## 2019-02-12 DIAGNOSIS — Z794 Long term (current) use of insulin: Secondary | ICD-10-CM | POA: Diagnosis not present

## 2019-02-12 DIAGNOSIS — K209 Esophagitis, unspecified without bleeding: Secondary | ICD-10-CM | POA: Diagnosis not present

## 2019-02-12 DIAGNOSIS — Z87891 Personal history of nicotine dependence: Secondary | ICD-10-CM | POA: Insufficient documentation

## 2019-02-12 DIAGNOSIS — I2510 Atherosclerotic heart disease of native coronary artery without angina pectoris: Secondary | ICD-10-CM | POA: Insufficient documentation

## 2019-02-12 DIAGNOSIS — X58XXXA Exposure to other specified factors, initial encounter: Secondary | ICD-10-CM | POA: Insufficient documentation

## 2019-02-12 DIAGNOSIS — Z95810 Presence of automatic (implantable) cardiac defibrillator: Secondary | ICD-10-CM | POA: Diagnosis not present

## 2019-02-12 DIAGNOSIS — Z885 Allergy status to narcotic agent status: Secondary | ICD-10-CM | POA: Insufficient documentation

## 2019-02-12 DIAGNOSIS — I252 Old myocardial infarction: Secondary | ICD-10-CM | POA: Diagnosis not present

## 2019-02-12 DIAGNOSIS — K222 Esophageal obstruction: Secondary | ICD-10-CM | POA: Insufficient documentation

## 2019-02-12 DIAGNOSIS — R918 Other nonspecific abnormal finding of lung field: Secondary | ICD-10-CM | POA: Diagnosis not present

## 2019-02-12 DIAGNOSIS — Z20828 Contact with and (suspected) exposure to other viral communicable diseases: Secondary | ICD-10-CM | POA: Diagnosis not present

## 2019-02-12 DIAGNOSIS — I4891 Unspecified atrial fibrillation: Secondary | ICD-10-CM | POA: Insufficient documentation

## 2019-02-12 DIAGNOSIS — Z7982 Long term (current) use of aspirin: Secondary | ICD-10-CM | POA: Diagnosis not present

## 2019-02-12 DIAGNOSIS — E1122 Type 2 diabetes mellitus with diabetic chronic kidney disease: Secondary | ICD-10-CM | POA: Insufficient documentation

## 2019-02-12 DIAGNOSIS — I130 Hypertensive heart and chronic kidney disease with heart failure and stage 1 through stage 4 chronic kidney disease, or unspecified chronic kidney disease: Secondary | ICD-10-CM | POA: Diagnosis not present

## 2019-02-12 DIAGNOSIS — W44F3XA Food entering into or through a natural orifice, initial encounter: Secondary | ICD-10-CM

## 2019-02-12 DIAGNOSIS — N183 Chronic kidney disease, stage 3 unspecified: Secondary | ICD-10-CM | POA: Insufficient documentation

## 2019-02-12 DIAGNOSIS — E785 Hyperlipidemia, unspecified: Secondary | ICD-10-CM | POA: Insufficient documentation

## 2019-02-12 DIAGNOSIS — K449 Diaphragmatic hernia without obstruction or gangrene: Secondary | ICD-10-CM | POA: Diagnosis not present

## 2019-02-12 DIAGNOSIS — Z03818 Encounter for observation for suspected exposure to other biological agents ruled out: Secondary | ICD-10-CM | POA: Diagnosis not present

## 2019-02-12 HISTORY — PX: ESOPHAGOGASTRODUODENOSCOPY: SHX5428

## 2019-02-12 HISTORY — PX: FOREIGN BODY REMOVAL: SHX962

## 2019-02-12 LAB — CBC WITH DIFFERENTIAL/PLATELET
Abs Immature Granulocytes: 0.09 10*3/uL — ABNORMAL HIGH (ref 0.00–0.07)
Basophils Absolute: 0.1 10*3/uL (ref 0.0–0.1)
Basophils Relative: 1 %
Eosinophils Absolute: 0.1 10*3/uL (ref 0.0–0.5)
Eosinophils Relative: 1 %
HCT: 43.3 % (ref 39.0–52.0)
Hemoglobin: 14.6 g/dL (ref 13.0–17.0)
Immature Granulocytes: 1 %
Lymphocytes Relative: 15 %
Lymphs Abs: 1.5 10*3/uL (ref 0.7–4.0)
MCH: 33.3 pg (ref 26.0–34.0)
MCHC: 33.7 g/dL (ref 30.0–36.0)
MCV: 98.6 fL (ref 80.0–100.0)
Monocytes Absolute: 0.7 10*3/uL (ref 0.1–1.0)
Monocytes Relative: 7 %
Neutro Abs: 7.9 10*3/uL — ABNORMAL HIGH (ref 1.7–7.7)
Neutrophils Relative %: 75 %
Platelets: 289 10*3/uL (ref 150–400)
RBC: 4.39 MIL/uL (ref 4.22–5.81)
RDW: 12.6 % (ref 11.5–15.5)
WBC: 10.4 10*3/uL (ref 4.0–10.5)
nRBC: 0 % (ref 0.0–0.2)

## 2019-02-12 LAB — BASIC METABOLIC PANEL
Anion gap: 12 (ref 5–15)
BUN: 22 mg/dL (ref 8–23)
CO2: 24 mmol/L (ref 22–32)
Calcium: 9.8 mg/dL (ref 8.9–10.3)
Chloride: 102 mmol/L (ref 98–111)
Creatinine, Ser: 1.04 mg/dL (ref 0.61–1.24)
GFR calc Af Amer: >60 mL/min (ref >60)
GFR calc non Af Amer: >60 mL/min (ref >60)
Glucose, Bld: 206 mg/dL — ABNORMAL HIGH (ref 70–99)
Potassium: 4.2 mmol/L (ref 3.5–5.1)
Sodium: 138 mmol/L (ref 135–145)

## 2019-02-12 LAB — CBG MONITORING, ED: Glucose-Capillary: 208 mg/dL — ABNORMAL HIGH (ref 70–99)

## 2019-02-12 LAB — SARS CORONAVIRUS 2 BY RT PCR (HOSPITAL ORDER, PERFORMED IN ~~LOC~~ HOSPITAL LAB): SARS Coronavirus 2: NEGATIVE

## 2019-02-12 SURGERY — EGD (ESOPHAGOGASTRODUODENOSCOPY)
Anesthesia: General

## 2019-02-12 SURGERY — ESOPHAGOGASTRODUODENOSCOPY (EGD) WITH PROPOFOL
Anesthesia: Monitor Anesthesia Care

## 2019-02-12 MED ORDER — FENTANYL CITRATE (PF) 100 MCG/2ML IJ SOLN: 25.0000 ug | INTRAMUSCULAR | Status: DC | PRN

## 2019-02-12 MED ORDER — PROPOFOL 10 MG/ML IV BOLUS
INTRAVENOUS | Status: DC | PRN
  Administered 2019-02-12: 150 mg via INTRAVENOUS

## 2019-02-12 MED ORDER — SUCCINYLCHOLINE CHLORIDE 200 MG/10ML IV SOSY
PREFILLED_SYRINGE | INTRAVENOUS | Status: DC | PRN
  Administered 2019-02-12: 140 mg via INTRAVENOUS

## 2019-02-12 MED ORDER — HYDRALAZINE HCL 20 MG/ML IJ SOLN
10.0000 mg | Freq: Once | INTRAMUSCULAR | Status: DC
  Filled 2019-02-12: qty 1

## 2019-02-12 MED ORDER — PROMETHAZINE HCL 25 MG/ML IJ SOLN: 6.2500 mg | INTRAMUSCULAR | Status: DC | PRN

## 2019-02-12 MED ORDER — METOPROLOL TARTRATE 5 MG/5ML IV SOLN
5.0000 mg | Freq: Once | INTRAVENOUS | Status: AC
  Administered 2019-02-12: 13:00:00 5 mg via INTRAVENOUS
  Filled 2019-02-12: qty 5

## 2019-02-12 MED ORDER — PANTOPRAZOLE SODIUM 40 MG IV SOLR
40.0000 mg | Freq: Once | INTRAVENOUS | Status: AC
  Administered 2019-02-12: 13:00:00 40 mg via INTRAVENOUS
  Filled 2019-02-12: qty 40

## 2019-02-12 MED ORDER — LIDOCAINE 2% (20 MG/ML) 5 ML SYRINGE
INTRAMUSCULAR | Status: DC | PRN
  Administered 2019-02-12: 100 mg via INTRAVENOUS

## 2019-02-12 MED ORDER — LACTATED RINGERS IV SOLN
INTRAVENOUS | Status: DC
  Administered 2019-02-12: 13:00:00 via INTRAVENOUS

## 2019-02-12 MED ORDER — PROPOFOL 500 MG/50ML IV EMUL
INTRAVENOUS | Status: DC | PRN
  Administered 2019-02-12: 100 ug/kg/min via INTRAVENOUS

## 2019-02-12 NOTE — Telephone Encounter (Signed)
Just received a phone call from this patient. He had a snack last night, states it got stuck and did not go down. Unable to drink liquids, it comes back up. History of esophageal stricture and food impaction in the past. I told him to go to the ED so we can plan for urgent endoscopy to remove the impaction, needs to be done at the hospital in case he needs preventative intubation if the impaction is severe / difficult to remove. He states he will go to [facility] hospital and I will let the inpatient GI service know he is coming in.   Nicola Police, [place] please keep in eye out this patient should be arriving soon in the [facility] ED for food impaction.   Viviana Simpler, I think he was supposed to come in for an EGD with dilation with you but did not look like he did not schedule

## 2019-02-12 NOTE — ED Triage Notes (Signed)
Pt reports eating a snack last night and now he has a food bolus stuck in his throat. Pt called GI doctor about coming into the office and he was told to come into the ED for evaluation.

## 2019-02-12 NOTE — Telephone Encounter (Signed)
Thank you Luke [name]! 

## 2019-02-12 NOTE — Anesthesia Preprocedure Evaluation (Addendum)
Anesthesia Evaluation  Patient identified by MRN, date of birth, ID band Patient awake    Reviewed: Allergy & Precautions, NPO status , Patient's Chart, lab work & pertinent test results  Airway Mallampati: II  TM Distance: >3 FB Neck ROM: Full    Dental no notable dental hx.    Pulmonary neg pulmonary ROS, former smoker,    Pulmonary exam normal breath sounds clear to auscultation       Cardiovascular hypertension, + Peripheral Vascular Disease and +CHF  Normal cardiovascular exam+ pacemaker + Cardiac Defibrillator  Rhythm:Regular Rate:Normal     Neuro/Psych negative neurological ROS  negative psych ROS   GI/Hepatic negative GI ROS, Neg liver ROS,   Endo/Other  diabetes  Renal/GU negative Renal ROS  negative genitourinary   Musculoskeletal negative musculoskeletal ROS (+)   Abdominal   Peds negative pediatric ROS (+)  Hematology negative hematology ROS (+)   Anesthesia Other Findings   Reproductive/Obstetrics negative OB ROS                             Anesthesia Physical Anesthesia Plan  ASA: III  Anesthesia Plan: MAC   Post-op Pain Management:    Induction: Intravenous and Rapid sequence  PONV Risk Score and Plan: 2 and Ondansetron, Dexamethasone and Treatment may vary due to age or medical condition  Airway Management Planned: Oral ETT  Additional Equipment:   Intra-op Plan:   Post-operative Plan: Extubation in OR  Informed Consent: I have reviewed the patients History and Physical, chart, labs and discussed the procedure including the risks, benefits and alternatives for the proposed anesthesia with the patient or authorized representative who has indicated his/her understanding and acceptance.     Dental advisory given  Plan Discussed with: CRNA and Surgeon  Anesthesia Plan Comments:       Anesthesia Quick Evaluation

## 2019-02-12 NOTE — ED Provider Notes (Signed)
[facility] EMERGENCY DEPARTMENT Provider Note   CSN: [id number] Arrival date & time: 02/12/19  [id number]     History   Chief Complaint Chief Complaint  Patient presents with  . Food Bolus Stuck in Throat    HPI Luke [name] is a 73 y.o. male.     HPI Patient reports that he has history of esophageal stricture.  He reports that he was eating a corn dog last night and did not get it chewed enough.  This happened at about 1030.  He reports it got stuck and he has not been able to pass it.  He reports that he cannot take his pills.  If he drinks a swig of water will come back up.  He reports he had the same thing in June and had to have an endoscopic procedure.  He denies he has pain but it feels really "irritated".  No difficulty breathing.  Patient has not been sick recently.  He has no fevers no chills.  He reports he has no known covert exposures.  His last test was in March.  Patient sees Dr. Silverio Decamp at [facility]. Past Medical History:  Diagnosis Date  . AAA (abdominal aortic aneurysm) ([place]) 2011   Per vascular surgery  . Anxiety    no med. in use for anxiety but pt. speaks openly of his stress & anxious feelings regarding impending surgery    . Atrial fibrillation ([place])   . Automatic implantable cardioverter-defibrillator in situ   . CAD (coronary artery disease)    Presumed CAD with nuclear scan October 09, 2011,  large anteroseptal MI and inferior MI. Catheterization scheduled October 15, 2011  . Cardiomyopathy [facility])    Nuclear, October 09, 2011, EF 30%, multiple focal wall motion abnormalities  . Diabetes mellitus    type II  . Drug therapy    Hyperkalemia with spironolactone  . Ejection fraction < 50%    EF 30%, nuclear, October 09, 2011  . Ejection fraction < 50%    EF previously 30%  //   EF 30-35%, echo, July 14, 2012, severe diffuse hypokinesis, PA pressure 43 mm mercury  . Fall due to ice or snow Feb.  19, 2015  . HLD (hyperlipidemia)   . Hypertension     white coat HTN-- often elevated in office and controlled on outside checks.  . ICD (implantable cardioverter-defibrillator) in place    CRT-D placed March, 2014 complete heart block and k dysfunction  . LBBB (left bundle branch block)    LBBB on EKG October 11, 2011,  no prior EKG has been done  . Low testosterone    Hx of  . Myocardial infarction ([place])   . Pacemaker   . PAD (peripheral artery disease) ([place])   . Pancreatitis     Patient Active Problem List   Diagnosis Date Noted  . Esophageal stricture   . Dysphagia 11/19/2017  . Acute pancreatitis 11/10/2017  . CKD (chronic kidney disease), stage III 11/10/2017  . SIRS (systemic inflammatory response syndrome) ([place]) 11/10/2017  . Pancreatitis 11/10/2017  . Healthcare maintenance 11/22/2016  . Tinnitus 05/23/2015  . Medicare annual wellness visit, subsequent 10/20/2014  . Advance care planning 10/20/2014  . S/P cervical spinal fusion 06/09/2014  . Preop cardiovascular exam 08/13/2013  . Central cord syndrome ([place]) 06/24/2013  . Encounter for fitting or adjustment of automatic implantable cardioverter-defibrillator 04/20/2013  . Drug therapy   . Umbilical hernia, incarcerated s/p lap repair with mesh 05/14/2013 03/19/2013  .  Status post abdominal aortic aneurysm repair 12/30/2012  . HLD (hyperlipidemia) 09/16/2012  . Ejection fraction < 50%   . Complete heart block ([place]) 07/14/2012  . SK (seborrheic keratosis) 02/12/2012  . Shoulder pain 11/08/2011  . CAD (coronary artery disease)   . Ischemic cardiomyopathy   . LBBB (left bundle branch block)   . PVD (peripheral vascular disease) ([place]) 10/08/2010  . ORGANIC IMPOTENCE 07/05/2010  . Essential hypertension, benign 02/27/2010  . Type 2 diabetes mellitus with vascular disease ([place]) 11/16/2009  . AAA (abdominal aortic aneurysm) ([place]) 05/06/2009    Past Surgical History:  Procedure Laterality Date  . ABDOMINAL AORTAGRAM N/A 10/28/2011   Procedure: ABDOMINAL [name];  Surgeon:  Angelia Mould, MD;  Location[facility] C[facility] LAB;  Service: Cardiovascular;  Laterality: N/A;  . ABDOMINAL AORTIC ANEURYSM REPAIR     EVAR   . BI-VENTRICULAR IMPLANTABLE CARDIOVERTER DEFIBRILLATOR N/A 07/16/2012   Procedure: BI-VENTRICULAR IMPLANTABLE CARDIOVERTER DEFIBRILLATOR  (CRT-D);  Surgeon: Deboraha Sprang, MD;  Location: [facility] CATH LAB;  Service: Cardiovascular;  Laterality: N/A;  . CARDIAC CATHETERIZATION    . COLONOSCOPY    . ESOPHAGOGASTRODUODENOSCOPY (EGD) WITH PROPOFOL N/A 02/05/2018   Procedure: ESOPHAGOGASTRODUODENOSCOPY (EGD) WITH PROPOFOL;  Surgeon: Mauri Pole, MD;  Location: [facility] ENDOSCOPY;  Service: Endoscopy;  Laterality: N/A;  . ESOPHAGOGASTRODUODENOSCOPY (EGD) WITH PROPOFOL N/A 09/27/2018   Procedure: ESOPHAGOGASTRODUODENOSCOPY (EGD) WITH PROPOFOL;  Surgeon: Carol Ada, MD;  Location: [facility];  Service: Endoscopy;  Laterality: N/A;  . FOREIGN BODY REMOVAL  09/27/2018   Procedure: FOREIGN BODY REMOVAL;  Surgeon: Carol Ada, MD;  Location: [facility] ENDOSCOPY;  Service: Endoscopy;;  . HERNIA REPAIR  Jan. 9, 2015  . INSERTION OF MESH N/A 05/14/2013   Procedure: INSERTION OF MESH;  Surgeon: Adin Hector, MD;  Location: [facility];  Service: General;  Laterality: N/A;  . LOWER EXTREMITY ANGIOGRAM Bilateral 10/28/2011   Procedure: LOWER EXTREMITY ANGIOGRAM;  Surgeon: Angelia Mould, MD;  Location: [facility] CATH LAB;  Service: Cardiovascular;  Laterality: Bilateral;  . PACEMAKER INSERTION  07-16-12   pacemaker/defibrilator  . POSTERIOR CERVICAL FUSION/FORAMINOTOMY N/A 06/09/2014   Procedure: Laminectomy - Cervical two-Cervcial four posterior cervical instrumented fusion Cervical two-cervical four;  Surgeon: Eustace Moore, MD;  Location: [facility] NEURO ORS;  Service: Neurosurgery;  Laterality: N/A;  posterior   . SAVORY DILATION N/A 02/05/2018   Procedure: SAVORY DILATION;  Surgeon: Mauri Pole, MD;  Location: [facility] ENDOSCOPY;  Service: Endoscopy;  Laterality: N/A;  . TONSILLECTOMY      as a child   . UMBILICAL HERNIA REPAIR N/A 05/14/2013   Procedure: LAPAROSCOPIC exploration and repair of hernia in abdominal ;  Surgeon: Adin Hector, MD;  Location: [facility];  Service: General;  Laterality: N/A;        Home Medications    Prior to Admission medications   Medication Sig Start Date End Date Taking? Authorizing Provider  amLODipine (NORVASC) 10 MG tablet Take 1 tablet (10 mg total) by mouth daily. 03/16/18   Jerline Pain, MD  aspirin EC 81 MG tablet Take 81 mg by mouth daily.    [provider]  carvedilol (COREG) 25 MG tablet Take 25 mg by mouth 2 (two) times daily with a meal.    [provider]  Cholecalciferol (VITAMIN D PO) Take 1,200 Units by mouth daily.     [provider]  Cyanocobalamin 2500 MCG CHEW Chew 1 tablet by mouth daily.    [provider]  glipiZIDE (GLUCOTROL) 10 MG  tablet Take 20 mg by mouth 2 (two) times daily.     [provider]  glipiZIDE (GLUCOTROL) 5 MG tablet TAKE 2 TABLETS(10 MG) BY MOUTH TWICE DAILY BEFORE A MEAL 01/26/19   Tonia Ghent, MD  glucose blood (ACCU-CHEK AVIVA) test strip Use up to 2 stips a day, Insulin treated diabetes. 10/26/18   Tonia Ghent, MD  hydrALAZINE (APRESOLINE) 25 MG tablet TAKE 1 TABLET(25 MG) BY MOUTH THREE TIMES DAILY 01/15/19   Tonia Ghent, MD  insulin glargine (LANTUS) 100 UNIT/ML injection Inject 0.3-0.35 mLs (30-35 Units total) into the skin daily. Dispense 5 pens 10/26/18   Tonia Ghent, MD  metFORMIN (GLUCOPHAGE) 500 MG tablet TAKE 2 TABLETS BY MOUTH TWICE DAILY WITH FOOD 01/19/19   Tonia Ghent, MD  Multiple Vitamin (MULTIVITAMIN) tablet Take 1 tablet by mouth daily.     [provider]  omeprazole (PRILOSEC) 40 MG capsule TAKE 1 CAPSULE(40 MG) BY MOUTH TWICE DAILY BEFORE A MEAL 10/19/18   Mauri Pole, MD  simvastatin (ZOCOR) 40 MG tablet TAKE 1 TABLET(40 MG) BY MOUTH AT BEDTIME 12/23/18   Tonia Ghent, MD    Family History  Family History  Problem Relation Age of Onset  . Hypertension Mother   . Stroke Mother   . Hyperlipidemia Mother   . Diabetes Sister   . Heart disease Sister        Before age 60  . Hypertension Sister   . Hyperlipidemia Sister   . Heart attack Sister   . Cancer Father        Lung  . Hypertension Son   . Colon cancer Neg Hx   . Prostate cancer Neg Hx   . Esophageal cancer Neg Hx   . Stomach cancer Neg Hx   . Rectal cancer Neg Hx     Social History Social History   Tobacco Use  . Smoking status: Former Smoker    Packs/day: 2.00    Years: 40.00    Pack years: 80.00    Types: Cigarettes    Quit date: 05/06/2004    Years since quitting: 14.7  . Smokeless tobacco: Never Used  Substance Use Topics  . Alcohol use: Yes    Alcohol/week: 0.0 - 2.0 standard drinks    Comment: beer or wine  occassionally  . Drug use: No     Allergies   Aldactone [spironolactone], Codeine, Januvia [sitagliptin], and Lisinopril   Review of Systems Review of Systems 10 Systems reviewed and are negative for acute change except as noted in the HPI.   Physical Exam Updated Vital Signs BP (!) 151/74   Pulse 69   Temp 98.1 F (36.7 C) (Oral)   Resp 15   Ht 6\' 2"  (1.88 m)   Wt 86.2 kg   SpO2 95%   BMI 24.39 kg/m   Physical Exam Constitutional:      Appearance: Normal appearance.  Eyes:     Extraocular Movements: Extraocular movements intact.  Cardiovascular:     Rate and Rhythm: Normal rate and regular rhythm.  Pulmonary:     Effort: Pulmonary effort is normal.     Breath sounds: Normal breath sounds.  Abdominal:     General: There is no distension.     Palpations: Abdomen is soft.     Tenderness: There is no abdominal tenderness. There is no guarding.  Musculoskeletal: Normal range of motion.        General: No swelling.  Right lower leg: No edema.     Left lower leg: No edema.  Skin:    General: Skin is warm and dry.  Neurological:     General: No focal deficit  present.     Mental Status: He is alert and oriented to person, place, and time.     Coordination: Coordination normal.  Psychiatric:        Mood and Affect: Mood normal.      ED Treatments / Results  Labs (all labs ordered are listed, but only abnormal results are displayed) Labs Reviewed  CBC WITH DIFFERENTIAL/PLATELET - Abnormal; Notable for the following components:      Result Value   Neutro Abs 7.9 (*)    Abs Immature Granulocytes 0.09 (*)    All other components within normal limits  BASIC METABOLIC PANEL - Abnormal; Notable for the following components:   Glucose, Bld 206 (*)    All other components within normal limits  CBG MONITORING, ED - Abnormal; Notable for the following components:   Glucose-Capillary 208 (*)    All other components within normal limits  SARS CORONAVIRUS 2 BY RT PCR (HOSPITAL ORDER, [facility] LAB)    EKG None  Radiology Dg Chest 2 View  Result Date: 02/12/2019 CLINICAL DATA:  Food impaction in the throat. EXAM: CHEST - 2 VIEW COMPARISON:  Sep 27, 2018 FINDINGS: The mediastinal contour and cardiac silhouette are stable. Cardiac pacemaker is unchanged. Fibrotic changes identified in the bilateral mid and lower lobes unchanged. Airways patent. The bony structures are stable. IMPRESSION: Stable chronic changes of both lungs.  The airways are patent. Electronically Signed   By: Abelardo Diesel M.D.   On: 02/12/2019 10:47    Procedures Procedures (including critical care time)  Medications Ordered in ED Medications  lactated ringers infusion ( Intravenous New Bag/Given 02/12/19 1317)  hydrALAZINE (APRESOLINE) injection 10 mg (has no administration in time range)  pantoprazole (PROTONIX) injection 40 mg (40 mg Intravenous Given 02/12/19 1305)  metoprolol tartrate (LOPRESSOR) injection 5 mg (5 mg Intravenous Given 02/12/19 1305)     Initial Impression / Assessment and Plan / ED Course  I have reviewed the triage vital signs  and the nursing notes.  Pertinent labs & imaging results that were available during my care of the patient were reviewed by me and considered in my medical decision making (see chart for details).  Clinical Course as of Feb 11 1353  Fri Feb 12, 2019  1219 Consult: Reviewed with Nancy Marus.  They anticipate taking patient for upper endoscopy at 4 PM today.   [MP]    Clinical Course User Index [MP] Charlesetta Shanks, MD       Patient is clinically well in appearance.  He has known history of esophageal stricture.  He has had a acute food impaction that started last night.  No airway difficulty.  Patient has not been to take his a.m. medications.  Will treat blood pressure with IV metoprolol and hydralazine.  GI advises anticipate upper endoscopy at approximately 4 PM this afternoon.  We will continue to monitor patient until his procedure.  Final Clinical Impressions(s) / ED Diagnoses   Final diagnoses:  Esophageal obstruction due to food impaction  Essential hypertension    ED Discharge Orders    None       Charlesetta Shanks, MD 02/12/19 1354

## 2019-02-12 NOTE — Transfer of Care (Signed)
Immediate Anesthesia Transfer of Care Note  Patient: Luke [name]  Procedure(s) Performed: ESOPHAGOGASTRODUODENOSCOPY (EGD) (N/A ) FOREIGN BODY REMOVAL  Patient Location: PACU  Anesthesia Type:General  Level of Consciousness: awake, alert , oriented and patient cooperative  Airway & Oxygen Therapy: Patient Spontanous Breathing and Patient connected to nasal cannula oxygen  Post-op Assessment: Report given to RN and Post -op Vital signs reviewed and stable  Post vital signs: Reviewed and stable  Last Vitals:  Vitals Value Taken Time  BP 117/69 02/12/19 1735  Temp    Pulse 67 02/12/19 1737  Resp 25 02/12/19 1737  SpO2 97 % 02/12/19 1737  Vitals shown include unvalidated device data.  Last Pain:  Vitals:   02/12/19 1736  TempSrc:   PainSc: (P) Asleep         Complications: No apparent anesthesia complications

## 2019-02-12 NOTE — Anesthesia Postprocedure Evaluation (Signed)
Anesthesia Post Note  Patient: JVON TRUSSEL  Procedure(s) Performed: ESOPHAGOGASTRODUODENOSCOPY (EGD) (N/A ) FOREIGN BODY REMOVAL     Patient location during evaluation: Endoscopy Anesthesia Type: General Level of consciousness: awake and alert Pain management: pain level controlled Vital Signs Assessment: post-procedure vital signs reviewed and stable Respiratory status: spontaneous breathing, nonlabored ventilation and respiratory function stable Cardiovascular status: blood pressure returned to baseline and stable Postop Assessment: no apparent nausea or vomiting Anesthetic complications: no    Last Vitals:  Vitals:   02/12/19 1736 02/12/19 1750  BP: 117/69 114/72  Pulse: 66 65  Resp: 20 17  Temp: 36.7 C   SpO2: 96% 96%    Last Pain:  Vitals:   02/12/19 1753  TempSrc:   PainSc: 0-No pain                 Catalina Gravel

## 2019-02-12 NOTE — Consult Note (Addendum)
Referring Provider:  Dr. Johnney Killian, EDP Primary Care Physician:  Tonia Ghent, MD Primary Gastroenterologist:  Dr. Silverio Decamp  Reason for Consultation:  Dysphagia, food impaction  HPI: Luke [name] is a 73 y.o. male with PMH listed below who presented to [facility] ED this AM with suspicion for food impaction.  He tells me that last night around 1030 his blood sugar was low so he had a snack of a "sausage dog" that he did not chew well and it got stuck.  Had food impaction in May, removed by Dr. Benson Norway.  Says that he tried all night and this morning to get it to pass, but it is still stuck.  Tolerating saliva and sips of water but any gulps of water come right back up.  Benign appearing esophageal stenosis in proximal esophagus on EGD in 10/2018 by Dr. Silverio Decamp.  Was balloon dilated from 15-18 mm and subsequently passed 43 French Maloney dilator with no resistance.   Past Medical History:  Diagnosis Date  . AAA (abdominal aortic aneurysm) ([place]) 2011   Per vascular surgery  . Anxiety    no med. in use for anxiety but pt. speaks openly of his stress & anxious feelings regarding impending surgery    . Atrial fibrillation ([place])   . Automatic implantable cardioverter-defibrillator in situ   . CAD (coronary artery disease)    Presumed CAD with nuclear scan October 09, 2011,  large anteroseptal MI and inferior MI. Catheterization scheduled October 15, 2011  . Cardiomyopathy [facility])    Nuclear, October 09, 2011, EF 30%, multiple focal wall motion abnormalities  . Diabetes mellitus    type II  . Drug therapy    Hyperkalemia with spironolactone  . Ejection fraction < 50%    EF 30%, nuclear, October 09, 2011  . Ejection fraction < 50%    EF previously 30%  //   EF 30-35%, echo, July 14, 2012, severe diffuse hypokinesis, PA pressure 43 mm mercury  . Fall due to ice or snow Feb.  19, 2015  . HLD (hyperlipidemia)   . Hypertension    white coat HTN-- often elevated in office and controlled on outside checks.  .  ICD (implantable cardioverter-defibrillator) in place    CRT-D placed March, 2014 complete heart block and k dysfunction  . LBBB (left bundle branch block)    LBBB on EKG October 11, 2011,  no prior EKG has been done  . Low testosterone    Hx of  . Myocardial infarction ([place])   . Pacemaker   . PAD (peripheral artery disease) ([place])   . Pancreatitis     Past Surgical History:  Procedure Laterality Date  . ABDOMINAL AORTAGRAM N/A 10/28/2011   Procedure: ABDOMINAL [name];  Surgeon: Angelia Mould, MD;  Location: [facility] CATH LAB;  Service: Cardiovascular;  Laterality: N/A;  . ABDOMINAL AORTIC ANEURYSM REPAIR     EVAR   . BI-VENTRICULAR IMPLANTABLE CARDIOVERTER DEFIBRILLATOR N/A 07/16/2012   Procedure: BI-VENTRICULAR IMPLANTABLE CARDIOVERTER DEFIBRILLATOR  (CRT-D);  Surgeon: Deboraha Sprang, MD;  Location: [facility] CATH LAB;  Service: Cardiovascular;  Laterality: N/A;  . CARDIAC CATHETERIZATION    . COLONOSCOPY    . ESOPHAGOGASTRODUODENOSCOPY (EGD) WITH PROPOFOL N/A 02/05/2018   Procedure: ESOPHAGOGASTRODUODENOSCOPY (EGD) WITH PROPOFOL;  Surgeon: Mauri Pole, MD;  Location: [facility] ENDOSCOPY;  Service: Endoscopy;  Laterality: N/A;  . ESOPHAGOGASTRODUODENOSCOPY (EGD) WITH PROPOFOL N/A 09/27/2018   Procedure: ESOPHAGOGASTRODUODENOSCOPY (EGD) WITH PROPOFOL;  Surgeon: Carol Ada, MD;  Location: [place];  Service:  Endoscopy;  Laterality: N/A;  . FOREIGN BODY REMOVAL  09/27/2018   Procedure: FOREIGN BODY REMOVAL;  Surgeon: Carol Ada, MD;  Location: [facility] ENDOSCOPY;  Service: Endoscopy;;  . HERNIA REPAIR  Jan. 9, 2015  . INSERTION OF MESH N/A 05/14/2013   Procedure: INSERTION OF MESH;  Surgeon: Adin Hector, MD;  Location: [place];  Service: General;  Laterality: N/A;  . LOWER EXTREMITY ANGIOGRAM Bilateral 10/28/2011   Procedure: LOWER EXTREMITY ANGIOGRAM;  Surgeon: Angelia Mould, MD;  Location: [facility] CATH LAB;  Service: Cardiovascular;  Laterality: Bilateral;  . PACEMAKER INSERTION  07-16-12    pacemaker/defibrilator  . POSTERIOR CERVICAL FUSION/FORAMINOTOMY N/A 06/09/2014   Procedure: Laminectomy - Cervical two-Cervcial four posterior cervical instrumented fusion Cervical two-cervical four;  Surgeon: Eustace Moore, MD;  Location: [place] NEURO ORS;  Service: Neurosurgery;  Laterality: N/A;  posterior   . SAVORY DILATION N/A 02/05/2018   Procedure: SAVORY DILATION;  Surgeon: Mauri Pole, MD;  Location: [facility] ENDOSCOPY;  Service: Endoscopy;  Laterality: N/A;  . TONSILLECTOMY     as a child   . UMBILICAL HERNIA REPAIR N/A 05/14/2013   Procedure: LAPAROSCOPIC exploration and repair of hernia in abdominal ;  Surgeon: Adin Hector, MD;  Location: [place];  Service: General;  Laterality: N/A;    Prior to Admission medications   Medication Sig Start Date End Date Taking? Authorizing Provider  amLODipine (NORVASC) 10 MG tablet Take 1 tablet (10 mg total) by mouth daily. 03/16/18   Jerline Pain, MD  aspirin EC 81 MG tablet Take 81 mg by mouth daily.    [provider]  carvedilol (COREG) 25 MG tablet Take 25 mg by mouth 2 (two) times daily with a meal.    [provider]  Cholecalciferol (VITAMIN D PO) Take 1,200 Units by mouth daily.     [provider]  Cyanocobalamin 2500 MCG CHEW Chew 1 tablet by mouth daily.    [provider]  glipiZIDE (GLUCOTROL) 10 MG tablet Take 20 mg by mouth 2 (two) times daily.     [provider]  glipiZIDE (GLUCOTROL) 5 MG tablet TAKE 2 TABLETS(10 MG) BY MOUTH TWICE DAILY BEFORE A MEAL 01/26/19   Tonia Ghent, MD  glucose blood (ACCU-CHEK AVIVA) test strip Use up to 2 stips a day, Insulin treated diabetes. 10/26/18   Tonia Ghent, MD  hydrALAZINE (APRESOLINE) 25 MG tablet TAKE 1 TABLET(25 MG) BY MOUTH THREE TIMES DAILY 01/15/19   Tonia Ghent, MD  insulin glargine (LANTUS) 100 UNIT/ML injection Inject 0.3-0.35 mLs (30-35 Units total) into the skin daily. Dispense 5 pens 10/26/18   Tonia Ghent, MD   metFORMIN (GLUCOPHAGE) 500 MG tablet TAKE 2 TABLETS BY MOUTH TWICE DAILY WITH FOOD 01/19/19   Tonia Ghent, MD  Multiple Vitamin (MULTIVITAMIN) tablet Take 1 tablet by mouth daily.     [provider]  omeprazole (PRILOSEC) 40 MG capsule TAKE 1 CAPSULE(40 MG) BY MOUTH TWICE DAILY BEFORE A MEAL 10/19/18   Mauri Pole, MD  simvastatin (ZOCOR) 40 MG tablet TAKE 1 TABLET(40 MG) BY MOUTH AT BEDTIME 12/23/18   Tonia Ghent, MD    Current Facility-Administered Medications  Medication Dose Route Frequency Provider Last Rate Last Dose  . hydrALAZINE (APRESOLINE) injection 10 mg  10 mg Intravenous Once Charlesetta Shanks, MD      . lactated ringers infusion   Intravenous Continuous Charlesetta Shanks, MD      . metoprolol tartrate (LOPRESSOR) injection  5 mg  5 mg Intravenous Once Charlesetta Shanks, MD      . pantoprazole (PROTONIX) injection 40 mg  40 mg Intravenous Once Charlesetta Shanks, MD       Current Outpatient Medications  Medication Sig Dispense Refill  . amLODipine (NORVASC) 10 MG tablet Take 1 tablet (10 mg total) by mouth daily. 90 tablet 3  . aspirin EC 81 MG tablet Take 81 mg by mouth daily.    . carvedilol (COREG) 25 MG tablet Take 25 mg by mouth 2 (two) times daily with a meal.    . Cholecalciferol (VITAMIN D PO) Take 1,200 Units by mouth daily.     . Cyanocobalamin 2500 MCG CHEW Chew 1 tablet by mouth daily.    Marland Kitchen glipiZIDE (GLUCOTROL) 10 MG tablet Take 20 mg by mouth 2 (two) times daily.     Marland Kitchen glipiZIDE (GLUCOTROL) 5 MG tablet TAKE 2 TABLETS(10 MG) BY MOUTH TWICE DAILY BEFORE A MEAL 360 tablet 2  . glucose blood (ACCU-CHEK AVIVA) test strip Use up to 2 stips a day, Insulin treated diabetes. 200 each 12  . hydrALAZINE (APRESOLINE) 25 MG tablet TAKE 1 TABLET(25 MG) BY MOUTH THREE TIMES DAILY 90 tablet 5  . insulin glargine (LANTUS) 100 UNIT/ML injection Inject 0.3-0.35 mLs (30-35 Units total) into the skin daily. Dispense 5 pens 15 mL 12  . metFORMIN (GLUCOPHAGE) 500 MG  tablet TAKE 2 TABLETS BY MOUTH TWICE DAILY WITH FOOD 360 tablet 1  . Multiple Vitamin (MULTIVITAMIN) tablet Take 1 tablet by mouth daily.     Marland Kitchen omeprazole (PRILOSEC) 40 MG capsule TAKE 1 CAPSULE(40 MG) BY MOUTH TWICE DAILY BEFORE A MEAL 60 capsule 3  . simvastatin (ZOCOR) 40 MG tablet TAKE 1 TABLET(40 MG) BY MOUTH AT BEDTIME 90 tablet 0    Allergies as of 02/12/2019 - Review Complete 02/12/2019  Allergen Reaction Noted  . Aldactone [spironolactone] Other (See Comments) 04/20/2013  . Codeine Rash and Hives   . Januvia [sitagliptin] Other (See Comments) 11/21/2016  . Lisinopril  11/18/2017    Family History  Problem Relation Age of Onset  . Hypertension Mother   . Stroke Mother   . Hyperlipidemia Mother   . Diabetes Sister   . Heart disease Sister        Before age 66  . Hypertension Sister   . Hyperlipidemia Sister   . Heart attack Sister   . Cancer Father        Lung  . Hypertension Son   . Colon cancer Neg Hx   . Prostate cancer Neg Hx   . Esophageal cancer Neg Hx   . Stomach cancer Neg Hx   . Rectal cancer Neg Hx     Social History   Socioeconomic History  . Marital status: Married    Spouse name: Precious Bard  . Number of children: 1  . Years of education: Not on file  . Highest education level: Not on file  Occupational History  . Occupation: retired  Scientific laboratory technician  . Financial resource strain: Not on file  . Food insecurity    Worry: Not on file    Inability: Not on file  . Transportation needs    Medical: Not on file    Non-medical: Not on file  Tobacco Use  . Smoking status: Former Smoker    Packs/day: 2.00    Years: 40.00    Pack years: 80.00    Types: Cigarettes    Quit date: 05/06/2004    Years since quitting:  14.7  . Smokeless tobacco: Never Used  Substance and Sexual Activity  . Alcohol use: Yes    Alcohol/week: 0.0 - 2.0 standard drinks    Comment: beer or wine  occassionally  . Drug use: No  . Sexual activity: Yes    Partners: Female  Lifestyle   . Physical activity    Days per week: Not on file    Minutes per session: Not on file  . Stress: Not on file  Relationships  . Social Herbalist on phone: Not on file    Gets together: Not on file    Attends religious service: Not on file    Active member of club or organization: Not on file    Attends meetings of clubs or organizations: Not on file    Relationship status: Not on file  . Intimate partner violence    Fear of current or ex partner: Not on file    Emotionally abused: Not on file    Physically abused: Not on file    Forced sexual activity: Not on file  Other Topics Concern  . 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      Review of Systems: ROS is O/W negative except as mentioned in HPI.  Physical Exam: Vital signs in last 24 hours: Temp:  [98.1 F (36.7 C)] 98.1 F (36.7 C) (10/09 0916) Pulse Rate:  [78-83] 78 (10/09 1252) Resp:  [13-18] 16 (10/09 1252) BP: (169-188)/(76-90) 169/76 (10/09 1252) SpO2:  [95 %-100 %] 95 % (10/09 1252) Weight:  [86.2 kg] 86.2 kg (10/09 0944)   General:  Alert, Well-developed, well-nourished, pleasant and cooperative in NAD; appears comfortable Head:  Normocephalic and atraumatic. Eyes:  Sclera clear, no icterus.  Conjunctiva pink. Ears:  Normal auditory acuity. Mouth:  No deformity or lesions.   Lungs:  Clear throughout to auscultation.   No wheezes, crackles, or rhonchi.  Heart:  Regular rate and rhythm; no murmurs, clicks, rubs, or gallops. Abdomen:  Soft, non-distended.  BS present.  Non-tender.  Msk:  Symmetrical without gross deformities. Pulses:  Normal pulses noted. Extremities:  Without clubbing or edema. Neurologic:  Alert and oriented x 4;  grossly normal neurologically. Skin:  Intact without significant lesions or rashes. Psych:  Alert and cooperative. Normal mood and affect.  Lab Results: Recent Labs    02/12/19 0956  WBC 10.4  HGB 14.6  HCT 43.3  PLT  289   BMET Recent Labs    02/12/19 0956  NA 138  K 4.2  CL 102  CO2 24  GLUCOSE 206*  BUN 22  CREATININE 1.04  CALCIUM 9.8   Studies/Results: Dg Chest 2 View  Result Date: 02/12/2019 CLINICAL DATA:  Food impaction in the throat. EXAM: CHEST - 2 VIEW COMPARISON:  Sep 27, 2018 FINDINGS: The mediastinal contour and cardiac silhouette are stable. Cardiac pacemaker is unchanged. Fibrotic changes identified in the bilateral mid and lower lobes unchanged. Airways patent. The bony structures are stable. IMPRESSION: Stable chronic changes of both lungs.  The airways are patent. Electronically Signed   By: Abelardo Diesel M.D.   On: 02/12/2019 10:47   IMPRESSION:  *Food impaction with "sausage dog" that he ate at 1030 PM last night.  Tolerating sips of water and saliva but gulps of liquids come back up and does not feel like the food has passed.  Has history of food impaction in the past as well  as proximal esophageal stricture that was last dilated in June.  PLAN: -EGD later today with food bolus removal.  Hopefully he can be discharged from the ED following that procedure. -Needs to eat soft foods and chew food well.   Laban Emperor. Zehr  02/12/2019, 12:56 PM    Attending physician's note   I have taken an interval history, reviewed the chart and examined the patient. I agree with the Advanced Practitioner's note, impression and recommendations.   Food impaction H/O esophageal stricture  Plan: -Emergent EGD.  I discussed risks and benefits including small but definite risks of esophageal perforation requiring thoracotomy, prolonged hospitalization.  Benefits also discussed he wishes to proceed. -Need to continue his PPIs thereafter.  Carmell Austria, MD [facility] [phone].

## 2019-02-14 ENCOUNTER — Encounter (HOSPITAL_COMMUNITY): Payer: Self-pay | Admitting: Gastroenterology

## 2019-02-15 LAB — GLUCOSE, CAPILLARY: Glucose-Capillary: 115 mg/dL — ABNORMAL HIGH (ref 70–99)

## 2019-02-15 NOTE — Op Note (Addendum)
[facility] Patient Name: Luke [name] Procedure Date : 02/12/2019 MRN: [id number] Attending MD: Jackquline Denmark , MD Date of Birth: 03-Sep-1945 CSN: [id number] Age: 73 Admit Type: Inpatient Procedure:                Upper GI endoscopy Indications:              Foreign body in the esophagus Providers:                Jackquline Denmark, MD, Grace Isaac, RN, Cherylynn Ridges,                            Technician, Gershon Crane CRNA, CRNA Referring MD:             Dr Denzil Magnuson Medicines:                General Anesthesia Complications:            No immediate complications. Estimated Blood Loss:     Estimated blood loss: none. Procedure:                Pre-Anesthesia Assessment:                           - Prior to the procedure, a History and Physical                            was performed, and patient medications and                            allergies were reviewed. The patient's tolerance of                            previous anesthesia was also reviewed. The risks                            and benefits of the procedure and the sedation                            options and risks were discussed with the patient.                            All questions were answered, and informed consent                            was obtained. Prior Anticoagulants: The patient has                            taken no previous anticoagulant or antiplatelet                            agents. ASA Grade Assessment: II - A patient with                            mild systemic disease. After reviewing the risks  and benefits, the patient was deemed in                            satisfactory condition to undergo the procedure.                           After obtaining informed consent, the endoscope was                            passed under direct vision. Throughout the                            procedure, the patient's blood pressure, pulse, and             oxygen saturations were monitored continuously. The                            [id number] Olympus gastroscope was                            introduced through the mouth, and advanced to the                            second part of duodenum. The upper GI endoscopy was                            accomplished without difficulty. The patient                            tolerated the procedure well. Scope In: Scope Out: Findings:      Food was found in the mid esophagus (sausage by description). Removal of       food was accomplished using a snare. Was underlying mild esophagitis       with mid esophageal stricture. The GE junction was normal.      Stomach was normal. The retroflexed examination did not reveal any       masses.      The examined duodenum was normal. Impression:               - Food in the mid esophagus. Removal was successful.                           - Underlying esophagitis with esophageal stricture. Recommendation:           - Patient has a contact number available for                            emergencies. The signs and symptoms of potential                            delayed complications were discussed with the                            patient. Return to normal activities tomorrow.  Written discharge instructions were provided to the                            patient.                           - Soft diet today.                           - Continue present medications.                           - Return to GI clinic in 4 weeks. He is already                            scheduled for EGD with dilatation and colonoscopy                            by Dr. Silverio Decamp. He should keep that appointment. Procedure Code(s):        --- Professional ---                           [phone], Esophagogastroduodenoscopy, flexible,                            transoral; with removal of foreign body(s) Diagnosis Code(s):        --- Professional  ---                           [id number], Food in esophagus causing other injury,                            initial encounter                           K44.9, Diaphragmatic hernia without obstruction or                            gangrene                           T18.108A, Unspecified foreign body in esophagus                            causing other injury, initial encounter CPT copyright 2019 American Medical Association. All rights reserved. The codes documented in this report are preliminary and upon coder review may  be revised to meet current compliance requirements. Jackquline Denmark, MD 02/12/2019 6:07:21 PM This report has been signed electronically. Number of Addenda: 0

## 2019-02-18 ENCOUNTER — Ambulatory Visit (INDEPENDENT_AMBULATORY_CARE_PROVIDER_SITE_OTHER): Payer: Medicare Other | Admitting: Family Medicine

## 2019-02-18 ENCOUNTER — Other Ambulatory Visit: Payer: Self-pay

## 2019-02-18 ENCOUNTER — Encounter: Payer: Self-pay | Admitting: Family Medicine

## 2019-02-18 VITALS — BP 138/74 | HR 62 | Temp 98.0°F | Ht 73.0 in | Wt 193.0 lb

## 2019-02-18 DIAGNOSIS — Z79899 Other long term (current) drug therapy: Secondary | ICD-10-CM

## 2019-02-18 DIAGNOSIS — I255 Ischemic cardiomyopathy: Secondary | ICD-10-CM | POA: Diagnosis not present

## 2019-02-18 DIAGNOSIS — Z0000 Encounter for general adult medical examination without abnormal findings: Secondary | ICD-10-CM

## 2019-02-18 DIAGNOSIS — E785 Hyperlipidemia, unspecified: Secondary | ICD-10-CM | POA: Diagnosis not present

## 2019-02-18 DIAGNOSIS — Z7189 Other specified counseling: Secondary | ICD-10-CM

## 2019-02-18 DIAGNOSIS — E1159 Type 2 diabetes mellitus with other circulatory complications: Secondary | ICD-10-CM | POA: Diagnosis not present

## 2019-02-18 DIAGNOSIS — I739 Peripheral vascular disease, unspecified: Secondary | ICD-10-CM | POA: Diagnosis not present

## 2019-02-18 DIAGNOSIS — R1310 Dysphagia, unspecified: Secondary | ICD-10-CM | POA: Diagnosis not present

## 2019-02-18 DIAGNOSIS — I10 Essential (primary) hypertension: Secondary | ICD-10-CM | POA: Diagnosis not present

## 2019-02-18 DIAGNOSIS — R1319 Other dysphagia: Secondary | ICD-10-CM

## 2019-02-18 DIAGNOSIS — Z23 Encounter for immunization: Secondary | ICD-10-CM

## 2019-02-18 LAB — COMPREHENSIVE METABOLIC PANEL
ALT: 24 U/L (ref 0–53)
AST: 17 U/L (ref 0–37)
Albumin: 4.4 g/dL (ref 3.5–5.2)
Alkaline Phosphatase: 77 U/L (ref 39–117)
BUN: 21 mg/dL (ref 6–23)
CO2: 29 mEq/L (ref 19–32)
Calcium: 10.1 mg/dL (ref 8.4–10.5)
Chloride: 101 mEq/L (ref 96–112)
Creatinine, Ser: 1.17 mg/dL (ref 0.40–1.50)
GFR: 61.22 mL/min (ref >60.00)
Glucose, Bld: 173 mg/dL — ABNORMAL HIGH (ref 70–99)
Potassium: 4.5 mEq/L (ref 3.5–5.1)
Sodium: 137 mEq/L (ref 135–145)
Total Bilirubin: 0.6 mg/dL (ref 0.2–1.2)
Total Protein: 7.1 g/dL (ref 6.0–8.3)

## 2019-02-18 LAB — LIPID PANEL
Cholesterol: 143 mg/dL (ref 0–200)
HDL: 32.1 mg/dL — ABNORMAL LOW (ref >39.00)
NonHDL: 110.72
Total CHOL/HDL Ratio: 4
Triglycerides: 266 mg/dL — ABNORMAL HIGH (ref 0.0–149.0)
VLDL: 53.2 mg/dL — ABNORMAL HIGH (ref 0.0–40.0)

## 2019-02-18 LAB — CBC WITH DIFFERENTIAL/PLATELET
Basophils Absolute: 0.1 10*3/uL (ref 0.0–0.1)
Basophils Relative: 0.8 % (ref 0.0–3.0)
Eosinophils Absolute: 0.2 10*3/uL (ref 0.0–0.7)
Eosinophils Relative: 2.3 % (ref 0.0–5.0)
HCT: 42 % (ref 39.0–52.0)
Hemoglobin: 14.3 g/dL (ref 13.0–17.0)
Lymphocytes Relative: 21.5 % (ref 12.0–46.0)
Lymphs Abs: 2 10*3/uL (ref 0.7–4.0)
MCHC: 34 g/dL (ref 30.0–36.0)
MCV: 98 fl (ref 78.0–100.0)
Monocytes Absolute: 0.8 10*3/uL (ref 0.1–1.0)
Monocytes Relative: 8.5 % (ref 3.0–12.0)
Neutro Abs: 6.2 10*3/uL (ref 1.4–7.7)
Neutrophils Relative %: 66.9 % (ref 43.0–77.0)
Platelets: 273 10*3/uL (ref 150.0–400.0)
RBC: 4.28 Mil/uL (ref 4.22–5.81)
RDW: 13.1 % (ref 11.5–15.5)
WBC: 9.3 10*3/uL (ref 4.0–10.5)

## 2019-02-18 LAB — LDL CHOLESTEROL, DIRECT: Direct LDL: 62 mg/dL

## 2019-02-18 LAB — HEMOGLOBIN A1C: Hgb A1c MFr Bld: 7.7 % — ABNORMAL HIGH (ref 4.6–6.5)

## 2019-02-18 LAB — VITAMIN B12: Vitamin B-12: >1500 pg/mL — ABNORMAL HIGH (ref 211–911)

## 2019-02-18 NOTE — Progress Notes (Signed)
Hearing Screening   125Hz  250Hz  500Hz  1000Hz  2000Hz  3000Hz  4000Hz  6000Hz  8000Hz   Right ear:           Left ear:           Comments: November 2020  Vision Screening Comments: August 2020 '

## 2019-02-18 NOTE — Progress Notes (Signed)
I have personally reviewed the Medicare Annual Wellness questionnaire and have noted 1. The patient's medical and social history 2. Their use of alcohol, tobacco or illicit drugs 3. Their current medications and supplements 4. The patient's functional ability including ADL's, fall risks, home safety risks and hearing or visual             impairment. 5. Diet and physical activities 6. Evidence for depression or mood disorders  The patients weight, height, BMI have been recorded in the chart and visual acuity is per eye clinic.  I have made referrals, counseling and provided education to the patient based review of the above and I have provided the pt with a written personalized care plan for preventive services.  Provider list updated- see scanned forms.  Routine anticipatory guidance given to patient.  See health maintenance. The possibility exists that previously documented standard health maintenance information may have been brought forward from a previous encounter into this note.  If needed, that same information has been updated to reflect the current situation based on today's encounter.    Flu 2020 Shingles discussed with PNA up-to-date Tetanus 2012 Colon of cancer screening follow-up pending Prostate cancer screening declined by patient.  Discussed pros and cons. Advance directive-wife designated if patient were incapacitated. Cognitive function addressed- see scanned forms- and if abnormal then additional documentation follows.   He has GI f/u pending after prev episode with food impaction.  We talked about B12 absorption and PPI use.  He needs to continue PPI use.  See notes on follow-up labs.  Diabetes:  Using medications without difficulties: yes Hypoglycemic episodes:rare, cautions d/w pt to avoid prolonged fasting.   Hyperglycemic episodes:no Feet problems: some tingling at baseline.  Blood Sugars averaging: ~100 in the AM, ~180 at nighttime.   eye exam within last  year: yes He has f/u pending with podiatry.    Hypertension:    Using medication without problems or lightheadedness:yes Chest pain with exertion:no Edema:no Short of breath:no Labs pending.    Elevated Cholesterol: Using medications without problems:yes Muscle aches: likely not from statin, d/w pt. D/w pt about staying well hydrated.   Diet compliance:yes Exercise:yes  He has vascular f/u pending.  He bought a treadmill and exercise bike since the gyms have closed.  He has some nocturnal leg cramps but not exertional cramps.    PMH and SH reviewed  Meds, vitals, and allergies reviewed.   ROS: Per HPI.  Unless specifically indicated otherwise in HPI, the patient denies:  General: fever. Eyes: acute vision changes ENT: sore throat Cardiovascular: chest pain Respiratory: SOB GI: vomiting GU: dysuria Musculoskeletal: acute back pain Derm: acute rash Neuro: acute motor dysfunction Psych: worsening mood Endocrine: polydipsia Heme: bleeding Allergy: hayfever  GEN: nad, alert and oriented HEENT: ncat NECK: supple w/o LA CV: rrr. PULM: ctab, no inc wob ABD: soft, +bs EXT: no edema SKIN: no acute rash  Diabetic foot exam: Normal inspection No skin breakdown Callus noted on R 1st foot.   Dec/trace DP pulses Dec sensation to light touch and monofilament on R compared to L foot Nails normal  Health Maintenance  Topic Date Due  . COLONOSCOPY  03/24/2019  . OPHTHALMOLOGY EXAM  06/25/2019  . HEMOGLOBIN A1C  08/19/2019  . FOOT EXAM  02/18/2020  . TETANUS/TDAP  07/04/2020  . INFLUENZA VACCINE  Completed  . Hepatitis C Screening  Completed  . PNA vac Low Risk Adult  Completed

## 2019-02-18 NOTE — Patient Instructions (Addendum)
Check with your insurance to see if they will cover the shingrix shot.  Go to the lab on the way out.  We'll contact you with your lab report. I'll await the notes from your other docs, especially podiatry.  Take care.  Glad to see you.  Recheck in about 6 months.

## 2019-02-21 NOTE — Assessment & Plan Note (Signed)
Controlled.  He does have a history of whitecoat hypertension but his blood pressure is controlled today.  No change in meds.  See notes on labs.

## 2019-02-21 NOTE — Assessment & Plan Note (Signed)
No change in meds at this point.  See notes on labs. 

## 2019-02-21 NOTE — Assessment & Plan Note (Signed)
Flu 2020 Shingles discussed with PNA up-to-date Tetanus 2012 Colon of cancer screening follow-up pending Prostate cancer screening declined by patient.  Discussed pros and cons. Advance directive-wife designated if patient were incapacitated. Cognitive function addressed- see scanned forms- and if abnormal then additional documentation follows.

## 2019-02-21 NOTE — Assessment & Plan Note (Signed)
He has vascular f/u pending.  He bought a treadmill and exercise bike since the gyms have closed.  He has some nocturnal leg cramps but not exertional cramps.   Continue current medications otherwise.

## 2019-02-21 NOTE — Assessment & Plan Note (Signed)
He has f/u pending with podiatry.   See notes on labs.  No change in meds at this point.  Discussed foot care.

## 2019-02-21 NOTE — Assessment & Plan Note (Signed)
  He has GI f/u pending after prev episode with food impaction.  We talked about B12 absorption and PPI use.  He needs to continue PPI use.  See notes on follow-up labs.

## 2019-02-21 NOTE — Assessment & Plan Note (Signed)
Advance directive- wife designated if patient were incapacitated.  

## 2019-02-22 ENCOUNTER — Other Ambulatory Visit: Payer: Self-pay | Admitting: *Deleted

## 2019-02-22 MED ORDER — OMEPRAZOLE 40 MG PO CPDR: 40.0000 mg | DELAYED_RELEASE_CAPSULE | Freq: Two times a day (BID) | ORAL | 3 refills | Status: DC

## 2019-02-26 ENCOUNTER — Encounter: Payer: Self-pay | Admitting: Gastroenterology

## 2019-03-10 ENCOUNTER — Other Ambulatory Visit: Payer: Self-pay | Admitting: Cardiology

## 2019-03-10 ENCOUNTER — Other Ambulatory Visit: Payer: Self-pay | Admitting: Family Medicine

## 2019-03-11 NOTE — Telephone Encounter (Signed)
Electronic refill request. Sildenafil Last office visit:   02/18/2019 Last Filled:   11/19/2018  ? quantity Please advise.

## 2019-03-12 NOTE — Telephone Encounter (Signed)
Sent. Thanks.   

## 2019-03-22 ENCOUNTER — Other Ambulatory Visit: Payer: Self-pay | Admitting: Family Medicine

## 2019-03-22 ENCOUNTER — Telehealth: Payer: Self-pay | Admitting: Gastroenterology

## 2019-03-22 NOTE — Telephone Encounter (Signed)
The pt does need a colon and EGD. Can you please set that up?

## 2019-03-23 ENCOUNTER — Encounter: Payer: Self-pay | Admitting: Gastroenterology

## 2019-04-08 ENCOUNTER — Other Ambulatory Visit: Payer: Self-pay

## 2019-04-08 ENCOUNTER — Ambulatory Visit (AMBULATORY_SURGERY_CENTER): Payer: Medicare Other

## 2019-04-08 VITALS — Temp 97.7°F | Ht 73.0 in | Wt 200.0 lb

## 2019-04-08 DIAGNOSIS — Z1159 Encounter for screening for other viral diseases: Secondary | ICD-10-CM

## 2019-04-08 DIAGNOSIS — R1310 Dysphagia, unspecified: Secondary | ICD-10-CM

## 2019-04-08 DIAGNOSIS — Z86010 Personal history of colonic polyps: Secondary | ICD-10-CM

## 2019-04-08 MED ORDER — NA SULFATE-K SULFATE-MG SULF 17.5-3.13-1.6 GM/177ML PO SOLN: 1.0000 | Freq: Once | ORAL | 0 refills | Status: AC

## 2019-04-08 NOTE — Progress Notes (Signed)
No egg or soy allergy known to patient  No issues with past sedation with any surgeries  or procedures, no intubation problems  No diet pills per patient No home 02 use per patient  No blood thinners per patient  Pt denies issues with constipation  No A fib or A flutter  EMMI video sent to pt's e mail   suprep sample given due to pt insurance not covering.  Due to the COVID-19 pandemic we are asking patients to follow these guidelines. Please only bring one care partner. Please be aware that your care partner may wait in the car in the parking lot or if they feel like they will be too hot to wait in the car, they may wait in the lobby on the 4th floor. All care partners are required to wear a mask the entire time (we do not have any that we can provide them), they need to practice social distancing, and we will do a Covid check for all patient's and care partners when you arrive. Also we will check their temperature and your temperature. If the care partner waits in their car they need to stay in the parking lot the entire time and we will call them on their cell phone when the patient is ready for discharge so they can bring the car to the front of the building. Also all patient's will need to wear a mask into building.

## 2019-04-12 ENCOUNTER — Ambulatory Visit (INDEPENDENT_AMBULATORY_CARE_PROVIDER_SITE_OTHER): Payer: Medicare Other | Admitting: Podiatry

## 2019-04-12 ENCOUNTER — Other Ambulatory Visit: Payer: Self-pay

## 2019-04-12 DIAGNOSIS — L97512 Non-pressure chronic ulcer of other part of right foot with fat layer exposed: Secondary | ICD-10-CM | POA: Diagnosis not present

## 2019-04-12 MED ORDER — DOXYCYCLINE HYCLATE 100 MG PO TABS: 100.0000 mg | ORAL_TABLET | Freq: Two times a day (BID) | ORAL | 0 refills | Status: DC

## 2019-04-12 MED ORDER — GENTAMICIN SULFATE 0.1 % EX CREA: 1.0000 "application " | TOPICAL_CREAM | Freq: Two times a day (BID) | CUTANEOUS | 1 refills | Status: DC

## 2019-04-14 NOTE — Progress Notes (Signed)
HPI: 73 y.o. male presenting today as a new patient with a chief complaint of a painful lesion noted to the plantar aspect of the right foot that has been ongoing for the past couple of years. He has gotten the area trimmed in the past but that only provides about one week of relief. He has gotten custom orthotics in the past but states they do not help. Walking and bearing weight intensifies his pain. Patient is here for further evaluation and treatment.   Past Medical History:  Diagnosis Date  . AAA (abdominal aortic aneurysm) ([place]) 2011   Per vascular surgery  . Anxiety    no med. in use for anxiety but pt. speaks openly of his stress & anxious feelings regarding impending surgery    . Atrial fibrillation ([place])   . Automatic implantable cardioverter-defibrillator in situ   . CAD (coronary artery disease)    Presumed CAD with nuclear scan October 09, 2011,  large anteroseptal MI and inferior MI. Catheterization scheduled October 15, 2011  . Cardiomyopathy [facility])    Nuclear, October 09, 2011, EF 30%, multiple focal wall motion abnormalities  . Diabetes mellitus    type II  . Drug therapy    Hyperkalemia with spironolactone  . Ejection fraction < 50%    EF 30%, nuclear, October 09, 2011  . Ejection fraction < 50%    EF previously 30%  //   EF 30-35%, echo, July 14, 2012, severe diffuse hypokinesis, PA pressure 43 mm mercury  . Fall due to ice or snow Feb.  19, 2015  . HLD (hyperlipidemia)   . Hypertension    white coat HTN-- often elevated in office and controlled on outside checks.  . ICD (implantable cardioverter-defibrillator) in place    CRT-D placed March, 2014 complete heart block and k dysfunction  . LBBB (left bundle branch block)    LBBB on EKG October 11, 2011,  no prior EKG has been done  . Low testosterone    Hx of  . Myocardial infarction ([place])   . Pacemaker   . PAD (peripheral artery disease) ([place])   . Pancreatitis      Physical Exam: General: The patient is alert and oriented  x3 in no acute distress.  Dermatology: Wound #1 noted to the right sub-first MPJ measuring approximately 0.2 x 0.2 x 0.1 cm.   To the above-noted ulceration, there is no eschar. There is a moderate amount of slough, fibrin and necrotic tissue. Granulation tissue and wound base is red. There is no malodor. There is a minimal amount of serosanginous drainage noted. Periwound integrity is intact.  Skin is warm, dry and supple bilateral lower extremities.  Vascular: Palpable pedal pulses bilaterally. No edema or erythema noted. Capillary refill within normal limits.  Neurological: Epicritic and protective threshold grossly intact bilaterally.   Musculoskeletal Exam: Range of motion within normal limits to all pedal and ankle joints bilateral. Muscle strength 5/5 in all groups bilateral.   Assessment: 1. Ulceration noted to the right sub-first MPJ x years    Plan of Care:  1. Patient evaluated. 2. Medically necessary excisional debridement including subcutaneous tissue was performed using a tissue nipper and a chisel blade. Excisional debridement of all the necrotic nonviable tissue down to healthy bleeding viable tissue was performed with post-debridement measurements same as pre-. 3. The wound was cleansed and dry sterile dressing applied. 4. Offloading met pad dispensed.  5. Continue using custom orthotics from different podiatry office.  6. Prescription for  Gentamicin cream provided to patient to use daily with a bandage.  7. Prescription for Doxycycline 100 mg #20 provided to patient.  8. Return to clinic in 4 weeks. If not better, we may consider sesamoidectomy.       Edrick Kins, DPM [facility]  Dr. Edrick Kins, DPM    [address], [place] [address]                Office [phone]  Fax [phone]

## 2019-04-16 ENCOUNTER — Other Ambulatory Visit: Payer: Self-pay | Admitting: Gastroenterology

## 2019-04-16 ENCOUNTER — Ambulatory Visit: Payer: Medicare Other

## 2019-04-16 DIAGNOSIS — Z1159 Encounter for screening for other viral diseases: Secondary | ICD-10-CM

## 2019-04-17 LAB — SARS CORONAVIRUS 2 (TAT 6-24 HRS): SARS Coronavirus 2: NEGATIVE

## 2019-04-21 ENCOUNTER — Encounter: Payer: Self-pay | Admitting: Gastroenterology

## 2019-04-21 ENCOUNTER — Other Ambulatory Visit: Payer: Self-pay

## 2019-04-21 ENCOUNTER — Ambulatory Visit (AMBULATORY_SURGERY_CENTER): Payer: Medicare Other | Admitting: Gastroenterology

## 2019-04-21 VITALS — BP 159/78 | HR 60 | Temp 97.6°F | Resp 14 | Ht 73.0 in | Wt 200.0 lb

## 2019-04-21 DIAGNOSIS — D120 Benign neoplasm of cecum: Secondary | ICD-10-CM

## 2019-04-21 DIAGNOSIS — K449 Diaphragmatic hernia without obstruction or gangrene: Secondary | ICD-10-CM | POA: Diagnosis not present

## 2019-04-21 DIAGNOSIS — D122 Benign neoplasm of ascending colon: Secondary | ICD-10-CM

## 2019-04-21 DIAGNOSIS — R1310 Dysphagia, unspecified: Secondary | ICD-10-CM | POA: Diagnosis not present

## 2019-04-21 DIAGNOSIS — K222 Esophageal obstruction: Secondary | ICD-10-CM | POA: Diagnosis not present

## 2019-04-21 DIAGNOSIS — Z86010 Personal history of colonic polyps: Secondary | ICD-10-CM

## 2019-04-21 MED ORDER — SODIUM CHLORIDE 0.9 % IV SOLN: 500.0000 mL | Freq: Once | INTRAVENOUS | Status: DC

## 2019-04-21 NOTE — Op Note (Signed)
[place] Patient Name: Luke [name] Procedure Date: 04/21/2019 9:05 AM MRN: [id number] Endoscopist: Mauri Pole , MD Age: 73 Referring MD:  Date of Birth: [birth year] Gender: Male Account #: [id number] Procedure:                Upper GI endoscopy Indications:              Dysphagia Medicines:                Monitored Anesthesia Care Procedure:                Pre-Anesthesia Assessment:                           - Prior to the procedure, a History and Physical                            was performed, and patient medications and                            allergies were reviewed. The patient's tolerance of                            previous anesthesia was also reviewed. The risks                            and benefits of the procedure and the sedation                            options and risks were discussed with the patient.                            All questions were answered, and informed consent                            was obtained. Prior Anticoagulants: The patient has                            taken no previous anticoagulant or antiplatelet                            agents. ASA Grade Assessment: III - A patient with                            severe systemic disease. After reviewing the risks                            and benefits, the patient was deemed in                            satisfactory condition to undergo the procedure.                           After obtaining informed consent, the endoscope was  passed under direct vision. Throughout the                            procedure, the patient's blood pressure, pulse, and                            oxygen saturations were monitored continuously. The                            Endoscope was introduced through the mouth, and                            advanced to the second part of duodenum. The                            patient tolerated the procedure well. The  upper GI                            endoscopy was accomplished without difficulty. Scope In: Scope Out: Findings:                 One benign-appearing, intrinsic moderate                            (circumferential scarring or stenosis; an endoscope                            may pass) stenosis was found 25 to 26 cm from the                            incisors. This stenosis measured 1.8 cm (inner                            diameter) x less than one cm (in length). The                            stenosis was traversed. A TTS dilator was passed                            through the scope. Dilation with an 18-19-20 mm                            balloon dilator was performed to 20 mm.                           A small hiatal hernia was present.                           The Z-line was regular and was found 37 cm from the                            incisors.  The stomach was normal.                           The examined duodenum was normal. Complications:            No immediate complications. Estimated Blood Loss:     Estimated blood loss was minimal. Impression:               - Benign-appearing esophageal stenosis. Dilated.                           - Small hiatal hernia.                           - Z-line regular, 37 cm from the incisors.                           - Normal stomach.                           - Normal examined duodenum.                           - No specimens collected. Recommendation:           - Patient has a contact number available for                            emergencies. The signs and symptoms of potential                            delayed complications were discussed with the                            patient. Return to normal activities tomorrow.                            Written discharge instructions were provided to the                            patient.                           - Resume previous diet.                            - Continue present medications.                           - See the other procedure note for documentation of                            additional recommendations. Mauri Pole, MD 04/21/2019 9:48:57 AM This report has been signed electronically.

## 2019-04-21 NOTE — Progress Notes (Signed)
Temperature taken by L.C., VS taken by C.W. 

## 2019-04-21 NOTE — Op Note (Signed)
[name] Procedure Date: 04/21/2019 9:04 AM MRN: [id number] Endoscopist: Mauri Pole , MD Age: 73 Referring MD:  Date of Birth: [birth year] Gender: Male Account #: [id number] Procedure:                Colonoscopy Indications:              High risk colon cancer surveillance: Personal                            history of colonic polyps, Surveillance: History of                            numerous (> 10) adenomas on last colonoscopy (< 3                            yrs) Medicines:                Monitored Anesthesia Care Procedure:                Pre-Anesthesia Assessment:                           - Prior to the procedure, a History and Physical                            was performed, and patient medications and                            allergies were reviewed. The patient's tolerance of                            previous anesthesia was also reviewed. The risks                            and benefits of the procedure and the sedation                            options and risks were discussed with the patient.                            All questions were answered, and informed consent                            was obtained. Prior Anticoagulants: The patient has                            taken no previous anticoagulant or antiplatelet                            agents. ASA Grade Assessment: III - A patient with                            severe systemic disease. After reviewing the risks  and benefits, the patient was deemed in                            satisfactory condition to undergo the procedure.                           After obtaining informed consent, the colonoscope                            was passed under direct vision. Throughout the                            procedure, the patient's blood pressure, pulse, and                            oxygen saturations were monitored continuously. The                             Colonoscope was introduced through the anus and                            advanced to the the cecum, identified by                            appendiceal orifice and ileocecal valve. The                            colonoscopy was technically difficult and complex                            due to inadequate bowel prep. Successful completion                            of the procedure was aided by lavage. The patient                            tolerated the procedure well. The quality of the                            bowel preparation was inadequate. The ileocecal                            valve, appendiceal orifice, and rectum were                            photographed. Scope In: 9:21:44 AM Scope Out: 9:42:15 AM Scope Withdrawal Time: 0 hours 17 minutes 32 seconds  Total Procedure Duration: 0 hours 20 minutes 31 seconds  Findings:                 The perianal and digital rectal examinations were                            normal.  A 1 mm polyp was found in the cecum. The polyp was                            sessile. The polyp was removed with a cold biopsy                            forceps. Resection and retrieval were complete.                           Four sessile polyps were found in the ascending                            colon. The polyps were 4 to 9 mm in size. These                            polyps were removed with a cold snare. Resection                            and retrieval were complete.                           Non-bleeding internal hemorrhoids were found during                            retroflexion. The hemorrhoids were small. Complications:            No immediate complications. Estimated Blood Loss:     Estimated blood loss was minimal. Impression:               - Preparation of the colon was inadequate.                           - One 1 mm polyp in the cecum, removed with a cold                             biopsy forceps. Resected and retrieved.                           - Four 4 to 9 mm polyps in the ascending colon,                            removed with a cold snare. Resected and retrieved.                           - Non-bleeding internal hemorrhoids. Recommendation:           - Patient has a contact number available for                            emergencies. The signs and symptoms of potential                            delayed complications were discussed with the  patient. Return to normal activities tomorrow.                            Written discharge instructions were provided to the                            patient.                           - Resume previous diet.                           - Continue present medications.                           - Await pathology results.                           - Repeat colonoscopy at the next available                            appointment because the bowel preparation was                            suboptimal.                           - For future colonoscopy the patient will require                            an extended preparation. Miralax 1 capful twice                            daily for 1 week prior to procedure and 1 day                            golytly prep day before procedure. If there are any                            questions, please contact the gastroenterologist. Mauri Pole, MD 04/21/2019 9:52:40 AM This report has been signed electronically.

## 2019-04-21 NOTE — Progress Notes (Signed)
Called to room to assist during endoscopic procedure.  Patient ID and intended procedure confirmed with present staff. Received instructions for my participation in the procedure from the performing physician.  

## 2019-04-21 NOTE — Patient Instructions (Signed)
Please read handouts provided. Continue present medications. Await pathology results.      YOU HAD AN ENDOSCOPIC PROCEDURE TODAY AT THE [facility]:   Refer to the procedure report that was given to you for any specific questions about what was found during the examination.  If the procedure report does not answer your questions, please call your gastroenterologist to clarify.  If you requested that your care partner not be given the details of your procedure findings, then the procedure report has been included in a sealed envelope for you to review at your convenience later.  YOU SHOULD EXPECT: Some feelings of bloating in the abdomen. Passage of more gas than usual.  Walking can help get rid of the air that was put into your GI tract during the procedure and reduce the bloating. If you had a lower endoscopy (such as a colonoscopy or flexible sigmoidoscopy) you may notice spotting of blood in your stool or on the toilet paper. If you underwent a bowel prep for your procedure, you may not have a normal bowel movement for a few days.  Please Note:  You might notice some irritation and congestion in your nose or some drainage.  This is from the oxygen used during your procedure.  There is no need for concern and it should clear up in a day or so.  SYMPTOMS TO REPORT IMMEDIATELY:   Following lower endoscopy (colonoscopy or flexible sigmoidoscopy):  Excessive amounts of blood in the stool  Significant tenderness or worsening of abdominal pains  Swelling of the abdomen that is new, acute  Fever of 100F or higher   Following upper endoscopy (EGD)  Vomiting of blood or coffee ground material  New chest pain or pain under the shoulder blades  Painful or persistently difficult swallowing  New shortness of breath  Fever of 100F or higher  Black, tarry-looking stools  For urgent or emergent issues, a gastroenterologist can be reached at any hour by calling [phone]  [phone].   DIET:  We do recommend a small meal at first, but then you may proceed to your regular diet.  Drink plenty of fluids but you should avoid alcoholic beverages for 24 hours.  ACTIVITY:  You should plan to take it easy for the rest of today and you should NOT DRIVE or use heavy machinery until tomorrow (because of the sedation medicines used during the test).    FOLLOW UP: Our staff will call the number listed on your records 48-72 hours following your procedure to check on you and address any questions or concerns that you may have regarding the information given to you following your procedure. If we do not reach you, we will leave a message.  We will attempt to reach you two times.  During this call, we will ask if you have developed any symptoms of COVID 19. If you develop any symptoms (ie: fever, flu-like symptoms, shortness of breath, cough etc.) before then, please call [phone].  If you test positive for Covid 19 in the 2 weeks post procedure, please call and report this information to us.    If any biopsies were taken you will be contacted by phone or by letter within the next 1-3 weeks.  Please call us at [phone] if you have not heard about the biopsies in 3 weeks.    SIGNATURES/CONFIDENTIALITY: You and/or your care partner have signed paperwork which will be entered into your electronic medical record.  These signatures attest to the fact   that that the information above on your After Visit Summary has been reviewed and is understood.  Full responsibility of the confidentiality of this discharge information lies with you and/or your care-partner. 

## 2019-04-21 NOTE — Progress Notes (Signed)
Report to PACU, RN, vss, BBS= Clear.  

## 2019-04-23 ENCOUNTER — Telehealth: Payer: Self-pay

## 2019-04-23 NOTE — Telephone Encounter (Signed)
  Follow up Call-  Call back number 04/21/2019 11/02/2018 06/17/2018 03/23/2018 01/29/2018  Post procedure Call Back phone  # [phone]  Permission to leave phone message Yes Yes Yes Yes Yes  Some recent data might be hidden     Patient questions:  Do you have a fever, pain , or abdominal swelling? No. Pain Score  0 *  Have you tolerated food without any problems? Yes.    Have you been able to return to your normal activities? Yes.    Do you have any questions about your discharge instructions: Diet   No. Medications  No. Follow up visit  No.  Do you have questions or concerns about your Care? No.  Actions: * If pain score is 4 or above: No action needed, pain <4.  1. Have you developed a fever since your procedure? no  2.   Have you had an respiratory symptoms (SOB or cough) since your procedure? no  3.   Have you tested positive for COVID 19 since your procedure no  4.   Have you had any family members/close contacts diagnosed with the COVID 19 since your procedure?  no   If yes to any of these questions please route to Joylene John, RN and Alphonsa Gin, Therapist, sports.

## 2019-04-27 ENCOUNTER — Ambulatory Visit (INDEPENDENT_AMBULATORY_CARE_PROVIDER_SITE_OTHER): Payer: Medicare Other | Admitting: *Deleted

## 2019-04-27 DIAGNOSIS — I442 Atrioventricular block, complete: Secondary | ICD-10-CM | POA: Diagnosis not present

## 2019-04-28 ENCOUNTER — Encounter: Payer: Self-pay | Admitting: Gastroenterology

## 2019-05-04 LAB — CUP PACEART REMOTE DEVICE CHECK
Battery Remaining Longevity: 22 mo
Battery Remaining Percentage: 25 %
Battery Voltage: 2.81 V
Brady Statistic AP VP Percent: 11 %
Brady Statistic AP VS Percent: 1 %
Brady Statistic AS VP Percent: 89 %
Brady Statistic AS VS Percent: 1 %
Brady Statistic RA Percent Paced: 10 %
Date Time Interrogation Session: 20201228104806
HighPow Impedance: 77 Ohm
HighPow Impedance: 77 Ohm
Implantable Lead Implant Date: 20140313
Implantable Lead Implant Date: 20140313
Implantable Lead Implant Date: 20140313
Implantable Lead Location: 753858
Implantable Lead Location: 753859
Implantable Lead Location: 753860
Implantable Pulse Generator Implant Date: 20140313
Lead Channel Impedance Value: 430 Ohm
Lead Channel Impedance Value: 460 Ohm
Lead Channel Impedance Value: 950 Ohm
Lead Channel Pacing Threshold Amplitude: 0.75 V
Lead Channel Pacing Threshold Amplitude: 0.875 V
Lead Channel Pacing Threshold Amplitude: 1 V
Lead Channel Pacing Threshold Pulse Width: 0.5 ms
Lead Channel Pacing Threshold Pulse Width: 0.5 ms
Lead Channel Pacing Threshold Pulse Width: 0.6 ms
Lead Channel Sensing Intrinsic Amplitude: 12 mV
Lead Channel Sensing Intrinsic Amplitude: 4 mV
Lead Channel Setting Pacing Amplitude: 2 V
Lead Channel Setting Pacing Amplitude: 2 V
Lead Channel Setting Pacing Amplitude: 2.5 V
Lead Channel Setting Pacing Pulse Width: 0.5 ms
Lead Channel Setting Pacing Pulse Width: 0.6 ms
Lead Channel Setting Sensing Sensitivity: 0.5 mV
Pulse Gen Serial Number: 7097007

## 2019-05-10 ENCOUNTER — Other Ambulatory Visit: Payer: Self-pay

## 2019-05-10 ENCOUNTER — Ambulatory Visit (INDEPENDENT_AMBULATORY_CARE_PROVIDER_SITE_OTHER): Payer: Medicare Other | Admitting: Podiatry

## 2019-05-10 DIAGNOSIS — E0843 Diabetes mellitus due to underlying condition with diabetic autonomic (poly)neuropathy: Secondary | ICD-10-CM

## 2019-05-10 DIAGNOSIS — L97512 Non-pressure chronic ulcer of other part of right foot with fat layer exposed: Secondary | ICD-10-CM | POA: Diagnosis not present

## 2019-05-10 MED ORDER — GENTAMICIN SULFATE 0.1 % EX CREA: 1.0000 "application " | TOPICAL_CREAM | Freq: Two times a day (BID) | CUTANEOUS | 1 refills | Status: DC

## 2019-05-10 MED ORDER — GABAPENTIN 100 MG PO CAPS: 100.0000 mg | ORAL_CAPSULE | Freq: Three times a day (TID) | ORAL | 3 refills | Status: DC

## 2019-05-13 NOTE — Progress Notes (Signed)
HPI: 74 y.o. male presenting today for follow up evaluation of an ulceration of the sub-first MPJ of the right foot. He states the wound is improving. He has been using Gentamicin cream which has helped heal the area. He denies worsening factors. Patient is here for further evaluation and treatment.   Past Medical History:  Diagnosis Date  . AAA (abdominal aortic aneurysm) ([place]) 2011   Per vascular surgery  . Anxiety    no med. in use for anxiety but pt. speaks openly of his stress & anxious feelings regarding impending surgery    . Atrial fibrillation ([place])   . Automatic implantable cardioverter-defibrillator in situ   . CAD (coronary artery disease)    Presumed CAD with nuclear scan October 09, 2011,  large anteroseptal MI and inferior MI. Catheterization scheduled October 15, 2011  . Cardiomyopathy [facility])    Nuclear, October 09, 2011, EF 30%, multiple focal wall motion abnormalities  . Diabetes mellitus    type II  . Drug therapy    Hyperkalemia with spironolactone  . Ejection fraction < 50%    EF 30%, nuclear, October 09, 2011  . Ejection fraction < 50%    EF previously 30%  //   EF 30-35%, echo, July 14, 2012, severe diffuse hypokinesis, PA pressure 43 mm mercury  . Fall due to ice or snow Feb.  19, 2015  . HLD (hyperlipidemia)   . Hypertension    white coat HTN-- often elevated in office and controlled on outside checks.  . ICD (implantable cardioverter-defibrillator) in place    CRT-D placed March, 2014 complete heart block and k dysfunction  . LBBB (left bundle branch block)    LBBB on EKG October 11, 2011,  no prior EKG has been done  . Low testosterone    Hx of  . Myocardial infarction ([place])   . Pacemaker   . PAD (peripheral artery disease) ([place])   . Pancreatitis      Physical Exam: General: The patient is alert and oriented x3 in no acute distress.  Dermatology: Wound noted to the right sub-first MPJ has healed. Complete re-epithelialization has occurred. No drainage noted.    Skin is warm, dry and supple bilateral lower extremities. Negative for open lesions or macerations.  Vascular: Palpable pedal pulses bilaterally. No edema or erythema noted. Capillary refill within normal limits.  Neurological: Epicritic and protective threshold diminished bilaterally.   Musculoskeletal Exam: Range of motion within normal limits to all pedal and ankle joints bilateral. Muscle strength 5/5 in all groups bilateral.   Assessment: 1. Ulceration of the right sub-first MPJ x years - healed  2. Peripheral polyneuropathy secondary to diabetes mellitus    Plan of Care:  1. Patient evaluated.   2. Excisional debridement of the wound of the right sub-first MPJ with a tissue nipper.  3. Prescription for Gabapentin 100 mg provided to patient.  4. Refill prescription for Gentamicin cream provided to patient.  5. Return to clinic in 3 months for routine care.      Edrick Kins, DPM [facility]  Dr. Edrick Kins, DPM    [address], [place] [address]  Office [phone]  Fax [phone]

## 2019-05-25 ENCOUNTER — Encounter: Payer: Self-pay | Admitting: Gastroenterology

## 2019-06-08 DIAGNOSIS — I5022 Chronic systolic (congestive) heart failure: Secondary | ICD-10-CM | POA: Insufficient documentation

## 2019-06-08 DIAGNOSIS — Z95810 Presence of automatic (implantable) cardiac defibrillator: Secondary | ICD-10-CM | POA: Insufficient documentation

## 2019-06-09 ENCOUNTER — Other Ambulatory Visit: Payer: Self-pay

## 2019-06-09 ENCOUNTER — Encounter: Payer: Self-pay | Admitting: Internal Medicine

## 2019-06-09 ENCOUNTER — Ambulatory Visit (INDEPENDENT_AMBULATORY_CARE_PROVIDER_SITE_OTHER): Payer: Medicare Other | Admitting: Internal Medicine

## 2019-06-09 VITALS — BP 148/72 | HR 67 | Ht 73.0 in | Wt 200.4 lb

## 2019-06-09 DIAGNOSIS — Z95810 Presence of automatic (implantable) cardiac defibrillator: Secondary | ICD-10-CM

## 2019-06-09 DIAGNOSIS — I447 Left bundle-branch block, unspecified: Secondary | ICD-10-CM | POA: Diagnosis not present

## 2019-06-09 DIAGNOSIS — I255 Ischemic cardiomyopathy: Secondary | ICD-10-CM | POA: Diagnosis not present

## 2019-06-09 DIAGNOSIS — I5022 Chronic systolic (congestive) heart failure: Secondary | ICD-10-CM

## 2019-06-09 NOTE — Progress Notes (Signed)
Patient Care Team: Tonia Ghent, MD as PCP - General Angelia Mould, MD (Vascular Surgery) Arta Silence, MD (Gastroenterology) Michael Boston, MD (General Surgery) Eustace Moore, MD (Neurosurgery) Idolina Primer [facility]) Jerline Pain, MD (Cardiology)   HPI  Luke [name] is a 74 y.o. male Seen in followup for an CRT-  ICD St Jude  implanted for intermittent complete heart block in the setting of ischemic cardiomyopathy and left bundle branch block.  LHC 2013  2V CAD Left anterior descending (LAD): focal 70-80% proximal. Long 40% mid vessel. Right coronary artery (RCA): Diffusely diseased. 50-60% mid vessel, 70% distal, 70% at bifurcation of PDA/PLOM.   DATE TEST EF   6/13 LHC  pLAD70-80; RCA diff 50-60%  3/14 Echo   30-35 %   3/19 Echo  55-65%      Date Cr K Hgb  10/20 1.17 4.5 14.3           The patient denies chest pain, shortness of breath, nocturnal dyspnea, orthopnea or peripheral edema.  There have been no palpitations, lightheadedness or syncope.       Past Medical History:  Diagnosis Date  . AAA (abdominal aortic aneurysm) ([place]) 2011   Per vascular surgery  . Anxiety    no med. in use for anxiety but pt. speaks openly of his stress & anxious feelings regarding impending surgery    . Atrial fibrillation ([place])   . Automatic implantable cardioverter-defibrillator in situ   . CAD (coronary artery disease)    Presumed CAD with nuclear scan October 09, 2011,  large anteroseptal MI and inferior MI. Catheterization scheduled October 15, 2011  . Cardiomyopathy [facility])    Nuclear, October 09, 2011, EF 30%, multiple focal wall motion abnormalities  . Diabetes mellitus    type II  . Drug therapy    Hyperkalemia with spironolactone  . Ejection fraction < 50%    EF 30%, nuclear, October 09, 2011  . Ejection fraction < 50%    EF previously 30%  //   EF 30-35%, echo, July 14, 2012, severe diffuse hypokinesis, PA pressure 43 mm mercury  . Fall due to ice or  snow Feb.  19, 2015  . HLD (hyperlipidemia)   . Hypertension    white coat HTN-- often elevated in office and controlled on outside checks.  . ICD (implantable cardioverter-defibrillator) in place    CRT-D placed March, 2014 complete heart block and k dysfunction  . LBBB (left bundle branch block)    LBBB on EKG October 11, 2011,  no prior EKG has been done  . Low testosterone    Hx of  . Myocardial infarction ([place])   . Pacemaker   . PAD (peripheral artery disease) ([place])   . Pancreatitis     Past Surgical History:  Procedure Laterality Date  . ABDOMINAL AORTAGRAM N/A 10/28/2011   Procedure: ABDOMINAL [name];  Surgeon: Angelia Mould, MD;  Location: [facility] CATH LAB;  Service: Cardiovascular;  Laterality: N/A;  . ABDOMINAL AORTIC ANEURYSM REPAIR     EVAR   . BI-VENTRICULAR IMPLANTABLE CARDIOVERTER DEFIBRILLATOR N/A 07/16/2012   Procedure: BI-VENTRICULAR IMPLANTABLE CARDIOVERTER DEFIBRILLATOR  (CRT-D);  Surgeon: Deboraha Sprang, MD;  Location: [facility] CATH LAB;  Service: Cardiovascular;  Laterality: N/A;  . CARDIAC CATHETERIZATION    . COLONOSCOPY  11/182019  . ESOPHAGOGASTRODUODENOSCOPY N/A 02/12/2019   Procedure: ESOPHAGOGASTRODUODENOSCOPY (EGD);  Surgeon: Jackquline Denmark, MD;  Location: [facility] ENDOSCOPY;  Service: Endoscopy;  Laterality: N/A;  .  ESOPHAGOGASTRODUODENOSCOPY (EGD) WITH PROPOFOL N/A 02/05/2018   Procedure: ESOPHAGOGASTRODUODENOSCOPY (EGD) WITH PROPOFOL;  Surgeon: Mauri Pole, MD;  Location: [facility] ENDOSCOPY;  Service: Endoscopy;  Laterality: N/A;  . ESOPHAGOGASTRODUODENOSCOPY (EGD) WITH PROPOFOL N/A 09/27/2018   Procedure: ESOPHAGOGASTRODUODENOSCOPY (EGD) WITH PROPOFOL;  Surgeon: Carol Ada, MD;  Location: [place];  Service: Endoscopy;  Laterality: N/A;  . FOREIGN BODY REMOVAL  09/27/2018   Procedure: FOREIGN BODY REMOVAL;  Surgeon: Carol Ada, MD;  Location: [facility] ENDOSCOPY;  Service: Endoscopy;;  . FOREIGN BODY REMOVAL  02/12/2019   Procedure: FOREIGN BODY REMOVAL;   Surgeon: Jackquline Denmark, MD;  Location: [facility] ENDOSCOPY;  Service: Endoscopy;;  . HERNIA REPAIR  Jan. 9, 2015  . INSERTION OF MESH N/A 05/14/2013   Procedure: INSERTION OF MESH;  Surgeon: Adin Hector, MD;  Location: [place];  Service: General;  Laterality: N/A;  . LOWER EXTREMITY ANGIOGRAM Bilateral 10/28/2011   Procedure: LOWER EXTREMITY ANGIOGRAM;  Surgeon: Angelia Mould, MD;  Location: [facility] CATH LAB;  Service: Cardiovascular;  Laterality: Bilateral;  . PACEMAKER INSERTION  07-16-12   pacemaker/defibrilator  . POLYPECTOMY    . POSTERIOR CERVICAL FUSION/FORAMINOTOMY N/A 06/09/2014   Procedure: Laminectomy - Cervical two-Cervcial four posterior cervical instrumented fusion Cervical two-cervical four;  Surgeon: Eustace Moore, MD;  Location: [place] NEURO ORS;  Service: Neurosurgery;  Laterality: N/A;  posterior   . SAVORY DILATION N/A 02/05/2018   Procedure: SAVORY DILATION;  Surgeon: Mauri Pole, MD;  Location: [facility] ENDOSCOPY;  Service: Endoscopy;  Laterality: N/A;  . TONSILLECTOMY     as a child   . UMBILICAL HERNIA REPAIR N/A 05/14/2013   Procedure: LAPAROSCOPIC exploration and repair of hernia in abdominal ;  Surgeon: Adin Hector, MD;  Location: [place];  Service: General;  Laterality: N/A;  . UPPER GASTROINTESTINAL ENDOSCOPY      Current Outpatient Medications  Medication Sig Dispense Refill  . amLODipine (NORVASC) 10 MG tablet TAKE 1 TABLET(10 MG) BY MOUTH DAILY 90 tablet 2  . aspirin EC 81 MG tablet Take 81 mg by mouth daily.    . carvedilol (COREG) 25 MG tablet Take 25 mg by mouth 2 (two) times daily with a meal.    . Cholecalciferol (VITAMIN D PO) Take 1,200 Units by mouth daily.     . Cyanocobalamin 2500 MCG CHEW Chew 1 tablet by mouth daily.    Marland Kitchen doxycycline (VIBRA-TABS) 100 MG tablet Take 1 tablet (100 mg total) by mouth 2 (two) times daily. 20 tablet 0  . gabapentin (NEURONTIN) 100 MG capsule Take 1 capsule (100 mg total) by mouth 3 (three) times daily. 90 capsule 3  .  glipiZIDE (GLUCOTROL) 5 MG tablet TAKE 2 TABLETS(10 MG) BY MOUTH TWICE DAILY BEFORE A MEAL 360 tablet 2  . glucose blood (ACCU-CHEK AVIVA) test strip Use up to 2 stips a day, Insulin treated diabetes. 200 each 12  . hydrALAZINE (APRESOLINE) 25 MG tablet TAKE 1 TABLET(25 MG) BY MOUTH THREE TIMES DAILY 90 tablet 5  . insulin glargine (LANTUS) 100 UNIT/ML injection Inject 0.3-0.35 mLs (30-35 Units total) into the skin daily. Dispense 5 pens 15 mL 12  . metFORMIN (GLUCOPHAGE) 500 MG tablet TAKE 2 TABLETS BY MOUTH TWICE DAILY WITH FOOD 360 tablet 1  . Multiple Vitamin (MULTIVITAMIN) tablet Take 1 tablet by mouth daily.     Marland Kitchen omeprazole (PRILOSEC) 40 MG capsule Take 1 capsule (40 mg total) by mouth 2 (two) times daily. 60 capsule 3  . sildenafil (REVATIO) 20 MG tablet TAKE  THREE TO FOUR TABLETS BY MOUTH DAILY AS NEEDED 50 tablet 11  . simvastatin (ZOCOR) 40 MG tablet TAKE 1 TABLET(40 MG) BY MOUTH AT BEDTIME 90 tablet 3   No current facility-administered medications for this visit.    Allergies  Allergen Reactions  . Aldactone [Spironolactone] Other (See Comments)    Hyperkalemia  . Codeine Rash and Hives  . Januvia [Sitagliptin] Other (See Comments)    Diarrhea and heart racing  . Lisinopril     Possible cause of pancreatitis    Review of Systems negative except from HPI and PMH  Physical Exam BP (!) 148/72   Pulse 67   Ht 6\' 1"  (1.854 m)   Wt 200 lb 6.4 oz (90.9 kg)   SpO2 97%   BMI 26.44 kg/m  Well developed and well nourished in no acute distress HENT normal Neck supple with JVP-flat Clear Device pocket well healed; without hematoma or erythema.  There is no tethering  Regular rate and rhythm, no  gallop No murmur Abd-soft with active BS No Clubbing cyanosis  edema Skin-warm and dry A & Oriented  Grossly normal sensory and motor function  ECG sinus rhythm with biventricular pacing with an upright QRS lead V1 negative QRS lead 1  Assessment and  Plan  Ischemic  Cardiomyopathy    Chronic HF systolic    Implantable Defibrillator CRT  St Jude   The patient's device was interrogated.  The information was reviewed. No changes were made in the programming.     Complete heart Block   Hyperlipidemia  Hypertension     Blood pressure modestly elevated. At home 120's    We will have him follow it at home.Euvolemic continue current meds

## 2019-06-09 NOTE — Patient Instructions (Signed)
Medication Instructions:  Your physician recommends that you continue on your current medications as directed. Please refer to the Current Medication list given to you today.  *If you need a refill on your cardiac medications before your next appointment, please call your pharmacy*  Lab Work: None ordered.  If you have labs (blood work) drawn today and your tests are completely normal, you will receive your results only by: Marland Kitchen MyChart Message (if you have MyChart) OR . A paper copy in the mail If you have any lab test that is abnormal or we need to change your treatment, we will call you to review the results.  Testing/Procedures: None ordered.   Follow-Up: At [facility], you and your health needs are our priority.  As part of our continuing mission to provide you with exceptional heart care, we have created designated Provider Care Teams.  These Care Teams include your primary Cardiologist (physician) and Advanced Practice Providers (APPs -  Physician Assistants and Nurse Practitioners) who all work together to provide you with the care you need, when you need it.  Your next appointment:   One year with Dr Caryl Comes

## 2019-06-10 LAB — CUP PACEART INCLINIC DEVICE CHECK
Battery Remaining Longevity: 22 mo
Brady Statistic RA Percent Paced: 10 %
Brady Statistic RV Percent Paced: 99.92 %
Date Time Interrogation Session: 20210203140800
HighPow Impedance: 79.875
Implantable Lead Implant Date: 20140312200000
Implantable Lead Implant Date: 20140312200000
Implantable Lead Implant Date: 20140312200000
Implantable Lead Location: 753858
Implantable Lead Location: 753859
Implantable Lead Location: 753860
Implantable Pulse Generator Implant Date: 20140312200000
Lead Channel Impedance Value: 425 Ohm
Lead Channel Impedance Value: 487.5 Ohm
Lead Channel Impedance Value: 912.5 Ohm
Lead Channel Pacing Threshold Amplitude: 0.75 V
Lead Channel Pacing Threshold Amplitude: 0.75 V
Lead Channel Pacing Threshold Amplitude: 1 V
Lead Channel Pacing Threshold Pulse Width: 0.5 ms
Lead Channel Pacing Threshold Pulse Width: 0.5 ms
Lead Channel Pacing Threshold Pulse Width: 0.6 ms
Lead Channel Sensing Intrinsic Amplitude: 12 mV
Lead Channel Sensing Intrinsic Amplitude: 3.8 mV
Lead Channel Setting Pacing Amplitude: 2 V
Lead Channel Setting Pacing Amplitude: 2 V
Lead Channel Setting Pacing Amplitude: 2.5 V
Lead Channel Setting Pacing Pulse Width: 0.5 ms
Lead Channel Setting Pacing Pulse Width: 0.6 ms
Lead Channel Setting Sensing Sensitivity: 0.5 mV
Pulse Gen Serial Number: 7097007

## 2019-06-17 DIAGNOSIS — E113293 Type 2 diabetes mellitus with mild nonproliferative diabetic retinopathy without macular edema, bilateral: Secondary | ICD-10-CM | POA: Diagnosis not present

## 2019-06-17 LAB — HM DIABETES EYE EXAM

## 2019-06-18 ENCOUNTER — Other Ambulatory Visit: Payer: Self-pay

## 2019-06-18 MED ORDER — METFORMIN HCL 500 MG PO TABS: ORAL_TABLET | ORAL | 2 refills | Status: DC

## 2019-06-23 ENCOUNTER — Other Ambulatory Visit: Payer: Self-pay | Admitting: Cardiology

## 2019-06-30 ENCOUNTER — Encounter: Payer: Self-pay | Admitting: Optometrist

## 2019-07-01 ENCOUNTER — Other Ambulatory Visit: Payer: Self-pay | Admitting: *Deleted

## 2019-07-01 ENCOUNTER — Other Ambulatory Visit: Payer: Self-pay | Admitting: Gastroenterology

## 2019-07-01 MED ORDER — OMEPRAZOLE 40 MG PO CPDR: 40.0000 mg | DELAYED_RELEASE_CAPSULE | Freq: Two times a day (BID) | ORAL | 0 refills | Status: DC

## 2019-07-26 ENCOUNTER — Other Ambulatory Visit: Payer: Self-pay | Admitting: *Deleted

## 2019-07-26 MED ORDER — HYDRALAZINE HCL 25 MG PO TABS: ORAL_TABLET | ORAL | 5 refills | Status: DC

## 2019-07-27 ENCOUNTER — Ambulatory Visit (INDEPENDENT_AMBULATORY_CARE_PROVIDER_SITE_OTHER): Payer: Medicare Other | Admitting: *Deleted

## 2019-07-27 DIAGNOSIS — I255 Ischemic cardiomyopathy: Secondary | ICD-10-CM

## 2019-07-27 LAB — CUP PACEART REMOTE DEVICE CHECK
Battery Remaining Longevity: 19 mo
Battery Remaining Percentage: 23 %
Battery Voltage: 2.8 V
Brady Statistic AP VP Percent: 11 %
Brady Statistic AP VS Percent: 1 %
Brady Statistic AS VP Percent: 89 %
Brady Statistic AS VS Percent: 1 %
Brady Statistic RA Percent Paced: 11 %
Date Time Interrogation Session: 20210323135526
HighPow Impedance: 82 Ohm
HighPow Impedance: 82 Ohm
Implantable Lead Implant Date: 20140313
Implantable Lead Implant Date: 20140313
Implantable Lead Implant Date: 20140313
Implantable Lead Location: 753858
Implantable Lead Location: 753859
Implantable Lead Location: 753860
Implantable Pulse Generator Implant Date: 20140313
Lead Channel Impedance Value: 400 Ohm
Lead Channel Impedance Value: 430 Ohm
Lead Channel Impedance Value: 800 Ohm
Lead Channel Pacing Threshold Amplitude: 0.75 V
Lead Channel Pacing Threshold Amplitude: 0.875 V
Lead Channel Pacing Threshold Amplitude: 1 V
Lead Channel Pacing Threshold Pulse Width: 0.5 ms
Lead Channel Pacing Threshold Pulse Width: 0.5 ms
Lead Channel Pacing Threshold Pulse Width: 0.6 ms
Lead Channel Sensing Intrinsic Amplitude: 11.9 mV
Lead Channel Sensing Intrinsic Amplitude: 3.9 mV
Lead Channel Setting Pacing Amplitude: 2 V
Lead Channel Setting Pacing Amplitude: 2 V
Lead Channel Setting Pacing Amplitude: 2.5 V
Lead Channel Setting Pacing Pulse Width: 0.5 ms
Lead Channel Setting Pacing Pulse Width: 0.6 ms
Lead Channel Setting Sensing Sensitivity: 0.5 mV
Pulse Gen Serial Number: 7097007

## 2019-07-28 ENCOUNTER — Ambulatory Visit: Payer: Medicare Other | Admitting: Internal Medicine

## 2019-07-28 NOTE — Progress Notes (Signed)
ICD Remote  

## 2019-08-10 ENCOUNTER — Ambulatory Visit (INDEPENDENT_AMBULATORY_CARE_PROVIDER_SITE_OTHER): Payer: Medicare Other | Admitting: Podiatry

## 2019-08-10 ENCOUNTER — Other Ambulatory Visit: Payer: Self-pay

## 2019-08-10 DIAGNOSIS — M79675 Pain in left toe(s): Secondary | ICD-10-CM | POA: Diagnosis not present

## 2019-08-10 DIAGNOSIS — B351 Tinea unguium: Secondary | ICD-10-CM | POA: Diagnosis not present

## 2019-08-10 DIAGNOSIS — N183 Chronic kidney disease, stage 3 unspecified: Secondary | ICD-10-CM | POA: Diagnosis not present

## 2019-08-10 DIAGNOSIS — L84 Corns and callosities: Secondary | ICD-10-CM | POA: Insufficient documentation

## 2019-08-10 DIAGNOSIS — E1159 Type 2 diabetes mellitus with other circulatory complications: Secondary | ICD-10-CM | POA: Diagnosis not present

## 2019-08-10 DIAGNOSIS — I739 Peripheral vascular disease, unspecified: Secondary | ICD-10-CM

## 2019-08-10 DIAGNOSIS — M79674 Pain in right toe(s): Secondary | ICD-10-CM | POA: Diagnosis not present

## 2019-08-10 NOTE — Progress Notes (Signed)
This patient returns to my office for at risk foot care.  This patient requires this care by a professional since this patient will be at risk due to having chronic kidney disease and diabetes with vascular disease. This patient is unable to cut nails himself since the patient cannot reach his nails.These nails are painful walking and wearing shoes.  He also says he has two painful callus right forefoot.  He says the callus under his big toe joint right foot is very painful.  He also has an irregular callus under the center of his right foot. The irregular callus was formed due to acid usage which was treated by Dr.  Amalia Hailey.   This patient presents for at risk foot care today.  General Appearance  Alert, conversant and in no acute stress.  Vascular  Dorsalis pedis and posterior tibial  pulses are weakly  palpable  bilaterally.  Capillary return is within normal limits  bilaterally. Temperature is within normal limits  bilaterally.  Neurologic  Senn-Weinstein monofilament wire test diminished   bilaterally. Muscle power within normal limits bilaterally.  Nails Thick disfigured discolored nails with subungual debris  from hallux to fifth toes bilaterally. No evidence of bacterial infection or drainage bilaterally.  Orthopedic  No limitations of motion  feet .  No crepitus or effusions noted.  No bony pathology or digital deformities noted. Sesamoiditis sub tibial sesamoid right foot.    Skin  normotropic skin  noted bilaterally.  Callus sub 3 right foot.    No signs of infections or ulcers noted.     Onychomycosis  Pain in right toes  Pain in left toes  Callus sub 3 right foot...  Consent was obtained for treatment procedures.   Mechanical debridement of nails 1-5  bilaterally performed with a nail nipper.  Filed with dremel without incident.  Debride callus sub 3 right foot.  Due to irritation from acid application his skin easily bled.  Cauterization of iatrogenic lesions was performed.  Patient was  told there is no callus under his tibial sesamoid but his pain is due to the bone.  He was told to return to Dr.  Amalia Hailey for x-rays and further evaluation.  Finally padding was applied to his insole.    RTC for nail care in 3 months.   Patient was told he did not need to refill his doxycycline and gentamicin cream.     Return office visit   3 months                   Told patient to return for periodic foot care and evaluation due to potential at risk complications.   Gardiner Barefoot DPM

## 2019-08-19 ENCOUNTER — Ambulatory Visit (INDEPENDENT_AMBULATORY_CARE_PROVIDER_SITE_OTHER): Payer: Medicare Other | Admitting: Family Medicine

## 2019-08-19 ENCOUNTER — Encounter: Payer: Self-pay | Admitting: Family Medicine

## 2019-08-19 ENCOUNTER — Other Ambulatory Visit: Payer: Self-pay

## 2019-08-19 VITALS — BP 162/82 | HR 72 | Temp 97.2°F | Ht 73.0 in | Wt 197.2 lb

## 2019-08-19 DIAGNOSIS — E1159 Type 2 diabetes mellitus with other circulatory complications: Secondary | ICD-10-CM

## 2019-08-19 DIAGNOSIS — I255 Ischemic cardiomyopathy: Secondary | ICD-10-CM | POA: Diagnosis not present

## 2019-08-19 LAB — POCT GLYCOSYLATED HEMOGLOBIN (HGB A1C): Hemoglobin A1C: 6.6 % — AB (ref 4.0–5.6)

## 2019-08-19 NOTE — Progress Notes (Signed)
This visit occurred during the SARS-CoV-2 public health emergency.  Safety protocols were in place, including screening questions prior to the visit, additional usage of staff PPE, and extensive cleaning of exam room while observing appropriate contact time as indicated for disinfecting solutions.  Diabetes:  Using medications without difficulties: yes Hypoglycemic episodes: rare, cautions d/w pt.   Hyperglycemic episodes:no Feet problems: dec sensation at baseline.  This is not changed.  Routine foot care discussed with patient. Blood Sugars averaging: ~90-100 in the AMs, occ higher in the evenings but still <140 eye exam within last year: yes A1c 6.6.  D/w pt about A1c at OV.   Glipizide 10mg  BID and insulin 30 units a day and metformin 2 tabs BID.   He is working on diet and back at the gym.    He had his covid vaccine.  His BP is clearly better out of clinic, d/w pt.    Meds, vitals, and allergies reviewed.  ROS: Per HPI unless specifically indicated in ROS section   GEN: nad, alert and oriented HEENT: ncat NECK: supple w/o LA CV: rrr. PULM: ctab, no inc wob ABD: soft, +bs EXT: no edema SKIN: no acute rash

## 2019-08-19 NOTE — Patient Instructions (Addendum)
If you have having any lows, then cut back on glipizide and update me.   If you have trouble getting back in with Dr. Scot Dock, then let me know.  Recheck in about 6 months at a yearly physical, labs ahead of time.  Take care.  Glad to see you.

## 2019-08-21 NOTE — Assessment & Plan Note (Signed)
A1c 6.6.  He deserves a lot of credit for all of his effort.  Discussed. He can update me if having any lows, can cut back on glipizide if needed. See after visit summary. Recheck in about 6 months at a yearly physical, labs ahead of time.  He agrees to plan.

## 2019-08-30 ENCOUNTER — Encounter: Payer: Self-pay | Admitting: Gastroenterology

## 2019-09-13 ENCOUNTER — Telehealth: Payer: Self-pay

## 2019-09-13 NOTE — Telephone Encounter (Signed)
Patient advised.

## 2019-09-13 NOTE — Telephone Encounter (Signed)
At this point, the guideline is to not test or quarantine when a vaccinated patient is exposed to patient with covid, assuming the vaccinated patient doesn't have symptoms.  I wouldn't do anything else now as long as he doesn't have symptoms.  I still expect him to do well.

## 2019-09-13 NOTE — Telephone Encounter (Signed)
Pt is upset because he went to mothers day dinner at a restaurant on 09/12/19 and sat beside another family member who is 74 yr old who was not wearing a mask and who tested positive today for covid; on 09/12/19 the 74 y.o.  Was coughing without covering his mouth,could not smell or taste and had been exposed to covid. Pt had his [place] covid vaccines on 05/23/19 and 06/12/19.  Pt does not have any covid symptoms at this time.  Pt wonders if should be tested and if so how soon should pt be tested. Pt will quarantine until gets cb after Dr Damita Dunnings reviews this note. Pt will cb if develops any symptoms.

## 2019-09-28 ENCOUNTER — Other Ambulatory Visit: Payer: Self-pay

## 2019-09-29 ENCOUNTER — Other Ambulatory Visit: Payer: Self-pay

## 2019-09-29 ENCOUNTER — Ambulatory Visit (AMBULATORY_SURGERY_CENTER): Payer: Self-pay

## 2019-09-29 VITALS — Ht 73.0 in | Wt 196.4 lb

## 2019-09-29 DIAGNOSIS — Z86010 Personal history of colonic polyps: Secondary | ICD-10-CM

## 2019-09-29 MED ORDER — PEG 3350-KCL-NA BICARB-NACL 420 G PO SOLR: 4000.0000 mL | Freq: Once | ORAL | 0 refills | Status: AC

## 2019-09-29 NOTE — Progress Notes (Signed)
No allergies to soy or egg Pt is not on blood thinners or diet pills Denies issues with sedation/intubation Denies atrial flutter/fib Denies constipation   Pt is aware of Covid safety and care partner requirements.      

## 2019-10-13 ENCOUNTER — Ambulatory Visit (AMBULATORY_SURGERY_CENTER): Payer: Medicare Other | Admitting: Gastroenterology

## 2019-10-13 ENCOUNTER — Encounter: Payer: Self-pay | Admitting: Gastroenterology

## 2019-10-13 ENCOUNTER — Other Ambulatory Visit: Payer: Self-pay

## 2019-10-13 VITALS — BP 156/79 | HR 60 | Temp 97.8°F | Resp 13 | Ht 73.0 in | Wt 196.0 lb

## 2019-10-13 DIAGNOSIS — D124 Benign neoplasm of descending colon: Secondary | ICD-10-CM | POA: Diagnosis not present

## 2019-10-13 DIAGNOSIS — Z86010 Personal history of colonic polyps: Secondary | ICD-10-CM | POA: Diagnosis present

## 2019-10-13 DIAGNOSIS — D123 Benign neoplasm of transverse colon: Secondary | ICD-10-CM | POA: Diagnosis not present

## 2019-10-13 DIAGNOSIS — D128 Benign neoplasm of rectum: Secondary | ICD-10-CM | POA: Diagnosis not present

## 2019-10-13 DIAGNOSIS — Z1211 Encounter for screening for malignant neoplasm of colon: Secondary | ICD-10-CM | POA: Diagnosis not present

## 2019-10-13 DIAGNOSIS — D120 Benign neoplasm of cecum: Secondary | ICD-10-CM

## 2019-10-13 DIAGNOSIS — K621 Rectal polyp: Secondary | ICD-10-CM | POA: Diagnosis not present

## 2019-10-13 MED ORDER — SODIUM CHLORIDE 0.9 % IV SOLN: 500.0000 mL | INTRAVENOUS | Status: DC

## 2019-10-13 NOTE — Op Note (Signed)
[facility] Patient Name: Luke [name] Procedure Date: 10/13/2019 8:38 AM MRN: [id number] Endoscopist: Mauri Pole , MD Age: 74 Referring MD:  Date of Birth: Nov 05, 1945 Gender: Male Account #: [id number] Procedure:                Colonoscopy Indications:              High risk colon cancer surveillance: Personal                            history of colonic polyps, Surveillance: History of                            numerous (> 10) adenomas on last colonoscopy (< 3                            yrs), High risk colon cancer surveillance: Personal                            history of adenoma (10 mm or greater in size) Medicines:                Monitored Anesthesia Care Procedure:                Pre-Anesthesia Assessment:                           - Prior to the procedure, a History and Physical                            was performed, and patient medications and                            allergies were reviewed. The patient's tolerance of                            previous anesthesia was also reviewed. The risks                            and benefits of the procedure and the sedation                            options and risks were discussed with the patient.                            All questions were answered, and informed consent                            was obtained. Prior Anticoagulants: The patient has                            taken no previous anticoagulant or antiplatelet                            agents. ASA Grade Assessment: III - A patient with  severe systemic disease. After reviewing the risks                            and benefits, the patient was deemed in                            satisfactory condition to undergo the procedure.                           After obtaining informed consent, the colonoscope                            was passed under direct vision. Throughout the                            procedure,  the patient's blood pressure, pulse, and                            oxygen saturations were monitored continuously. The                            Colonoscope was introduced through the anus and                            advanced to the the cecum, identified by                            appendiceal orifice and ileocecal valve. The                            colonoscopy was performed without difficulty. The                            patient tolerated the procedure well. The quality                            of the bowel preparation was adequate. The                            ileocecal valve, appendiceal orifice, and rectum                            were photographed. Scope In: 8:46:51 AM Scope Out: 9:16:31 AM Scope Withdrawal Time: 0 hours 20 minutes 55 seconds  Total Procedure Duration: 0 hours 29 minutes 40 seconds  Findings:                 The perianal and digital rectal examinations were                            normal.                           Three sessile polyps were found in the rectum,  descending colon and cecum. The polyps were 1 to 2                            mm in size. These polyps were removed with a cold                            biopsy forceps. Resection and retrieval were                            complete.                           Two sessile polyps were found in the transverse                            colon. The polyps were 6 to 8 mm in size. These                            polyps were removed with a cold snare. Resection                            and retrieval were complete.                           Scattered small and large-mouthed diverticula were                            found in the sigmoid colon, descending colon and                            ascending colon.                           Non-bleeding internal hemorrhoids were found during                            retroflexion. The hemorrhoids were medium-sized.                            The exam was otherwise without abnormality. Complications:            No immediate complications. Estimated Blood Loss:     Estimated blood loss was minimal. Impression:               - Three 1 to 2 mm polyps in the rectum, in the                            descending colon and in the cecum, SKIP and removed                            with a cold biopsy forceps. Resected and retrieved.                           - Two 6 to 8 mm polyps in the transverse  colon,                            removed with a cold snare. Resected and retrieved.                           - Moderate diverticulosis in the sigmoid colon, in                            the descending colon and in the ascending colon.                           - Non-bleeding internal hemorrhoids.                           - The examination was otherwise normal. Recommendation:           - Patient has a contact number available for                            emergencies. The signs and symptoms of potential                            delayed complications were discussed with the                            patient. Return to normal activities tomorrow.                            Written discharge instructions were provided to the                            patient.                           - Resume previous diet.                           - Continue present medications.                           - Await pathology results.                           - Repeat colonoscopy in 3 years for surveillance                            based on pathology results. Mauri Pole, MD 10/13/2019 9:24:14 AM This report has been signed electronically.

## 2019-10-13 NOTE — Progress Notes (Signed)
To PACU VSS. Report to [email or web address]

## 2019-10-13 NOTE — Progress Notes (Signed)
Called to room to assist during endoscopic procedure.  Patient ID and intended procedure confirmed with present staff. Received instructions for my participation in the procedure from the performing physician.  

## 2019-10-13 NOTE — Patient Instructions (Signed)
Please read handouts provided. Continue present medications. Await pathology results.   YOU HAD AN ENDOSCOPIC PROCEDURE TODAY AT THE [facility]:   Refer to the procedure report that was given to you for any specific questions about what was found during the examination.  If the procedure report does not answer your questions, please call your gastroenterologist to clarify.  If you requested that your care partner not be given the details of your procedure findings, then the procedure report has been included in a sealed envelope for you to review at your convenience later.  YOU SHOULD EXPECT: Some feelings of bloating in the abdomen. Passage of more gas than usual.  Walking can help get rid of the air that was put into your GI tract during the procedure and reduce the bloating. If you had a lower endoscopy (such as a colonoscopy or flexible sigmoidoscopy) you may notice spotting of blood in your stool or on the toilet paper. If you underwent a bowel prep for your procedure, you may not have a normal bowel movement for a few days.  Please Note:  You might notice some irritation and congestion in your nose or some drainage.  This is from the oxygen used during your procedure.  There is no need for concern and it should clear up in a day or so.  SYMPTOMS TO REPORT IMMEDIATELY:  Following lower endoscopy (colonoscopy or flexible sigmoidoscopy):  Excessive amounts of blood in the stool  Significant tenderness or worsening of abdominal pains  Swelling of the abdomen that is new, acute  Fever of 100F or higher   For urgent or emergent issues, a gastroenterologist can be reached at any hour by calling [phone]. Do not use MyChart messaging for urgent concerns.    DIET:  We do recommend a small meal at first, but then you may proceed to your regular diet.  Drink plenty of fluids but you should avoid alcoholic beverages for 24 hours.  ACTIVITY:  You should plan to take it easy  for the rest of today and you should NOT DRIVE or use heavy machinery until tomorrow (because of the sedation medicines used during the test).    FOLLOW UP: Our staff will call the number listed on your records 48-72 hours following your procedure to check on you and address any questions or concerns that you may have regarding the information given to you following your procedure. If we do not reach you, we will leave a message.  We will attempt to reach you two times.  During this call, we will ask if you have developed any symptoms of COVID 19. If you develop any symptoms (ie: fever, flu-like symptoms, shortness of breath, cough etc.) before then, please call [phone].  If you test positive for Covid 19 in the 2 weeks post procedure, please call and report this information to us.    If any biopsies were taken you will be contacted by phone or by letter within the next 1-3 weeks.  Please call us at [phone] if you have not heard about the biopsies in 3 weeks.    SIGNATURES/CONFIDENTIALITY: You and/or your care partner have signed paperwork which will be entered into your electronic medical record.  These signatures attest to the fact that that the information above on your After Visit Summary has been reviewed and is understood.  Full responsibility of the confidentiality of this discharge information lies with you and/or your care-partner.  

## 2019-10-13 NOTE — Progress Notes (Signed)
Pt's states no medical or surgical changes since previsit or office visit. 

## 2019-10-15 ENCOUNTER — Telehealth: Payer: Self-pay

## 2019-10-15 NOTE — Telephone Encounter (Signed)
Covid-19 screening questions   Do you now or have you had a fever in the last 14 days? No.  Do you have any respiratory symptoms of shortness of breath or cough now or in the last 14 days? No.  Do you have any family members or close contacts with diagnosed or suspected Covid-19 in the past 14 days? No.  Have you been tested for Covid-19 and found to be positive? No.       Follow up Call-  Call back number 10/13/2019 04/21/2019 11/02/2018 06/17/2018 03/23/2018 01/29/2018  Post procedure Call Back phone  # [phone]  Permission to leave phone message Yes Yes Yes Yes Yes Yes  Some recent data might be hidden     Patient questions:  Do you have a fever, pain , or abdominal swelling? No. Pain Score  0 *  Have you tolerated food without any problems? Yes.    Have you been able to return to your normal activities? Yes.    Do you have any questions about your discharge instructions: Diet   No. Medications  No. Follow up visit  No.  Do you have questions or concerns about your Care? No.  Actions: * If pain score is 4 or above: No action needed, pain <4.

## 2019-10-19 ENCOUNTER — Ambulatory Visit: Payer: Medicare Other | Admitting: Podiatry

## 2019-10-19 ENCOUNTER — Encounter: Payer: Self-pay | Admitting: Gastroenterology

## 2019-10-20 ENCOUNTER — Other Ambulatory Visit: Payer: Self-pay | Admitting: Family Medicine

## 2019-10-26 ENCOUNTER — Ambulatory Visit (INDEPENDENT_AMBULATORY_CARE_PROVIDER_SITE_OTHER): Payer: Medicare Other | Admitting: *Deleted

## 2019-10-26 DIAGNOSIS — I255 Ischemic cardiomyopathy: Secondary | ICD-10-CM | POA: Diagnosis not present

## 2019-10-27 ENCOUNTER — Telehealth: Payer: Self-pay

## 2019-10-27 LAB — CUP PACEART REMOTE DEVICE CHECK
Battery Remaining Longevity: 16 mo
Battery Remaining Percentage: 18 %
Battery Voltage: 2.75 V
Brady Statistic AP VP Percent: 13 %
Brady Statistic AP VS Percent: 1 %
Brady Statistic AS VP Percent: 86 %
Brady Statistic AS VS Percent: 1 %
Brady Statistic RA Percent Paced: 12 %
Date Time Interrogation Session: 20210623135632
HighPow Impedance: 77 Ohm
HighPow Impedance: 77 Ohm
Implantable Lead Implant Date: 20140313
Implantable Lead Implant Date: 20140313
Implantable Lead Implant Date: 20140313
Implantable Lead Location: 753858
Implantable Lead Location: 753859
Implantable Lead Location: 753860
Implantable Pulse Generator Implant Date: 20140313
Lead Channel Impedance Value: 400 Ohm
Lead Channel Impedance Value: 410 Ohm
Lead Channel Impedance Value: 790 Ohm
Lead Channel Pacing Threshold Amplitude: 0.625 V
Lead Channel Pacing Threshold Amplitude: 0.75 V
Lead Channel Pacing Threshold Amplitude: 1 V
Lead Channel Pacing Threshold Pulse Width: 0.5 ms
Lead Channel Pacing Threshold Pulse Width: 0.5 ms
Lead Channel Pacing Threshold Pulse Width: 0.6 ms
Lead Channel Sensing Intrinsic Amplitude: 11.9 mV
Lead Channel Sensing Intrinsic Amplitude: 4.1 mV
Lead Channel Setting Pacing Amplitude: 2 V
Lead Channel Setting Pacing Amplitude: 2 V
Lead Channel Setting Pacing Amplitude: 2.5 V
Lead Channel Setting Pacing Pulse Width: 0.5 ms
Lead Channel Setting Pacing Pulse Width: 0.6 ms
Lead Channel Setting Sensing Sensitivity: 0.5 mV
Pulse Gen Serial Number: 7097007

## 2019-10-27 NOTE — Telephone Encounter (Signed)
Left message for patient to remind of missed remote transmission.  

## 2019-10-28 NOTE — Progress Notes (Signed)
Remote ICD transmission.   

## 2019-11-29 ENCOUNTER — Ambulatory Visit (INDEPENDENT_AMBULATORY_CARE_PROVIDER_SITE_OTHER): Payer: Medicare Other | Admitting: Cardiology

## 2019-11-29 ENCOUNTER — Other Ambulatory Visit: Payer: Self-pay

## 2019-11-29 ENCOUNTER — Encounter: Payer: Self-pay | Admitting: Cardiology

## 2019-11-29 VITALS — BP 160/80 | HR 75 | Ht 73.0 in | Wt 195.0 lb

## 2019-11-29 DIAGNOSIS — I255 Ischemic cardiomyopathy: Secondary | ICD-10-CM

## 2019-11-29 DIAGNOSIS — I5022 Chronic systolic (congestive) heart failure: Secondary | ICD-10-CM

## 2019-11-29 DIAGNOSIS — I447 Left bundle-branch block, unspecified: Secondary | ICD-10-CM | POA: Diagnosis not present

## 2019-11-29 DIAGNOSIS — I714 Abdominal aortic aneurysm, without rupture, unspecified: Secondary | ICD-10-CM

## 2019-11-29 NOTE — Patient Instructions (Signed)
Medication Instructions:  The current medical regimen is effective;  continue present plan and medications.  *If you need a refill on your cardiac medications before your next appointment, please call your pharmacy*  Follow-Up: At [facility], you and your health needs are our priority.  As part of our continuing mission to provide you with exceptional heart care, we have created designated Provider Care Teams.  These Care Teams include your primary Cardiologist (physician) and Advanced Practice Providers (APPs -  Physician Assistants and Nurse Practitioners) who all work together to provide you with the care you need, when you need it.  We recommend signing up for the patient portal called "MyChart".  Sign up information is provided on this After Visit Summary.  MyChart is used to connect with patients for Virtual Visits (Telemedicine).  Patients are able to view lab/test results, encounter notes, upcoming appointments, etc.  Non-urgent messages can be sent to your provider as well.   To learn more about what you can do with MyChart, go to [email or web address].    Your next appointment:   12 month(s)  The format for your next appointment:   In Person  Provider:   Mark Skains, MD   Thank you for choosing [facility]!!      

## 2019-11-29 NOTE — Progress Notes (Signed)
Cardiology Office Note:    Date:  11/29/2019   ID:  Luke [name], DOB Dec 29, 1945, MRN [id number]  PCP:  Tonia Ghent, MD  Cardiologist:  No primary care provider on file.  Electrophysiologist:  None   Referring MD: Tonia Ghent, MD   CAD follow-up  History of Present Illness:    Luke [name] is a 74 y.o. male CRT-ICD after intermittent complete heart block in the setting of ischemic cardiomyopathy and left bundle branch block who developed pancreatitis on lisinopril.  Goes by [name].  Former Dr. [name] patient.  [place] 2013 - 2V CAD Left anterior descending (LAD): focal 70-80% proximal. Long 40% mid vessel. Right coronary artery (RCA): Diffusely diseased. 50-60% mid vessel, 70% distal, 70% at bifurcation of PDA/PLOM.  LV Ejection Fraction: 30%. He underwent catheterization which confirmed severe LV dysfunction with moderate to severe 2 vessel disease not thought to be appropriate for intervention.  Repeat echocardiogram in 2019 showed normal EF.  Bought a treadmill, half the way to [place].   11/29/2019-here for the follow-up of ischemic cardiomyopathy prior CRT ICD. 2 dizzy episodes. NO supplement --stopped and better.  Overall he has been pleased with the way he is feeling.  Trying to lose some weight.  Defibrillator working well.  No chest pain fevers chills nausea vomiting syncope bleeding  Past Medical History:  Diagnosis Date  . AAA (abdominal aortic aneurysm) ([place]) 2011   Per vascular surgery  . Anxiety    no med. in use for anxiety but pt. speaks openly of his stress & anxious feelings regarding impending surgery    . Atrial fibrillation ([place])   . Automatic implantable cardioverter-defibrillator in situ   . CAD (coronary artery disease)    Presumed CAD with nuclear scan October 09, 2011,  large anteroseptal MI and inferior MI. Catheterization scheduled October 15, 2011  . Cardiomyopathy [facility])    Nuclear, October 09, 2011, EF 30%, multiple focal wall motion abnormalities  .  Diabetes mellitus    type II  . Drug therapy    Hyperkalemia with spironolactone  . Ejection fraction < 50%    EF 30%, nuclear, October 09, 2011  . Ejection fraction < 50%    EF previously 30%  //   EF 30-35%, echo, July 14, 2012, severe diffuse hypokinesis, PA pressure 43 mm mercury  . Fall due to ice or snow Feb.  19, 2015  . HLD (hyperlipidemia)   . Hypertension    white coat HTN-- often elevated in office and controlled on outside checks.  . ICD (implantable cardioverter-defibrillator) in place    CRT-D placed March, 2014 complete heart block and k dysfunction  . LBBB (left bundle branch block)    LBBB on EKG October 11, 2011,  no prior EKG has been done  . Low testosterone    Hx of  . Myocardial infarction ([place])    Told in 2015-never knew-showed up on stress test for AAA;   . Pacemaker   . PAD (peripheral artery disease) ([place])   . Pancreatitis     Past Surgical History:  Procedure Laterality Date  . ABDOMINAL AORTAGRAM N/A 10/28/2011   Procedure: ABDOMINAL [name];  Surgeon: Angelia Mould, MD;  Location: [facility] CATH LAB;  Service: Cardiovascular;  Laterality: N/A;  . ABDOMINAL AORTIC ANEURYSM REPAIR     EVAR   . BI-VENTRICULAR IMPLANTABLE CARDIOVERTER DEFIBRILLATOR N/A 07/16/2012   Procedure: BI-VENTRICULAR IMPLANTABLE CARDIOVERTER DEFIBRILLATOR  (CRT-D);  Surgeon: Deboraha Sprang, MD;  Location: [facility] CATH  LAB;  Service: Cardiovascular;  Laterality: N/A;  . CARDIAC CATHETERIZATION    . COLONOSCOPY  03/23/2018; 2020  . ESOPHAGOGASTRODUODENOSCOPY N/A 02/12/2019   Procedure: ESOPHAGOGASTRODUODENOSCOPY (EGD);  Surgeon: Jackquline Denmark, MD;  Location: [facility] ENDOSCOPY;  Service: Endoscopy;  Laterality: N/A;  . ESOPHAGOGASTRODUODENOSCOPY (EGD) WITH PROPOFOL N/A 02/05/2018   Procedure: ESOPHAGOGASTRODUODENOSCOPY (EGD) WITH PROPOFOL;  Surgeon: Mauri Pole, MD;  Location: [facility] ENDOSCOPY;  Service: Endoscopy;  Laterality: N/A;  . ESOPHAGOGASTRODUODENOSCOPY (EGD) WITH PROPOFOL N/A 09/27/2018     Procedure: ESOPHAGOGASTRODUODENOSCOPY (EGD) WITH PROPOFOL;  Surgeon: Carol Ada, MD;  Location: [place];  Service: Endoscopy;  Laterality: N/A;  . FOREIGN BODY REMOVAL  09/27/2018   Procedure: FOREIGN BODY REMOVAL;  Surgeon: Carol Ada, MD;  Location: [facility] ENDOSCOPY;  Service: Endoscopy;;  . FOREIGN BODY REMOVAL  02/12/2019   Procedure: FOREIGN BODY REMOVAL;  Surgeon: Jackquline Denmark, MD;  Location: [facility] ENDOSCOPY;  Service: Endoscopy;;  . HERNIA REPAIR  Jan. 9, 2015  . INSERTION OF MESH N/A 05/14/2013   Procedure: INSERTION OF MESH;  Surgeon: Adin Hector, MD;  Location: [place];  Service: General;  Laterality: N/A;  . LOWER EXTREMITY ANGIOGRAM Bilateral 10/28/2011   Procedure: LOWER EXTREMITY ANGIOGRAM;  Surgeon: Angelia Mould, MD;  Location: [facility] CATH LAB;  Service: Cardiovascular;  Laterality: Bilateral;  . PACEMAKER INSERTION  07-16-12   pacemaker/defibrilator  . POLYPECTOMY    . POSTERIOR CERVICAL FUSION/FORAMINOTOMY N/A 06/09/2014   Procedure: Laminectomy - Cervical two-Cervcial four posterior cervical instrumented fusion Cervical two-cervical four;  Surgeon: Eustace Moore, MD;  Location: [place] NEURO ORS;  Service: Neurosurgery;  Laterality: N/A;  posterior   . SAVORY DILATION N/A 02/05/2018   Procedure: SAVORY DILATION;  Surgeon: Mauri Pole, MD;  Location: [facility] ENDOSCOPY;  Service: Endoscopy;  Laterality: N/A;  . TONSILLECTOMY     as a child   . UMBILICAL HERNIA REPAIR N/A 05/14/2013   Procedure: LAPAROSCOPIC exploration and repair of hernia in abdominal ;  Surgeon: Adin Hector, MD;  Location: [place];  Service: General;  Laterality: N/A;  . UPPER GASTROINTESTINAL ENDOSCOPY  2020    Current Medications: Current Meds  Medication Sig  . amLODipine (NORVASC) 10 MG tablet TAKE 1 TABLET(10 MG) BY MOUTH DAILY  . aspirin EC 81 MG tablet Take 81 mg by mouth daily.  . carvedilol (COREG) 25 MG tablet TAKE 1 TABLET BY MOUTH TWICE DAILY WITH MEALS  . Cholecalciferol (VITAMIN D  PO) Take 1,200 Units by mouth daily.   . Cyanocobalamin 2500 MCG CHEW Chew 1 tablet by mouth daily.  Marland Kitchen glipiZIDE (GLUCOTROL) 5 MG tablet TAKE 2 TABLETS(10 MG) BY MOUTH TWICE DAILY BEFORE A MEAL  . glucose blood (ACCU-CHEK AVIVA) test strip Use up to 2 stips a day, Insulin treated diabetes.  . hydrALAZINE (APRESOLINE) 25 MG tablet TAKE 1 TABLET(25 MG) BY MOUTH THREE TIMES DAILY  . insulin glargine (LANTUS) 100 UNIT/ML injection Inject 0.3-0.35 mLs (30-35 Units total) into the skin daily. Dispense 5 pens  . metFORMIN (GLUCOPHAGE) 500 MG tablet TAKE 2 TABLETS BY MOUTH TWICE DAILY WITH FOOD  . Multiple Vitamin (MULTIVITAMIN) tablet Take 1 tablet by mouth daily.   Marland Kitchen omeprazole (PRILOSEC) 40 MG capsule TAKE 1 CAPSULE(40 MG) BY MOUTH TWICE DAILY  . sildenafil (REVATIO) 20 MG tablet TAKE THREE TO FOUR TABLETS BY MOUTH DAILY AS NEEDED  . simvastatin (ZOCOR) 40 MG tablet TAKE 1 TABLET(40 MG) BY MOUTH AT BEDTIME     Allergies:   Aldactone [spironolactone], Codeine, Januvia [sitagliptin],  and Lisinopril   Social History   Socioeconomic History  . Marital status: Married    Spouse name: Precious Bard  . Number of children: 1  . Years of education: Not on file  . Highest education level: Not on file  Occupational History  . Occupation: retired  Tobacco Use  . Smoking status: Former Smoker    Packs/day: 2.00    Years: 40.00    Pack years: 80.00    Types: Cigarettes    Quit date: 05/06/2004    Years since quitting: 15.5  . Smokeless tobacco: Never Used  Vaping Use  . Vaping Use: Never used  Substance and Sexual Activity  . Alcohol use: Yes    Alcohol/week: 0.0 - 2.0 standard drinks    Comment: beer or wine  occassionally  . Drug use: No  . Sexual activity: Yes    Partners: Female  Other Topics Concern  . Not on file  Social History Narrative   [place] vet, he has known service related agent orange exposure.    Retired   Chiropractor daily.     Social Determinants of Health   Financial Resource  Strain:   . Difficulty of Paying Living Expenses:   Food Insecurity:   . Worried About Charity fundraiser in the Last Year:   . Arboriculturist in the Last Year:   Transportation Needs:   . Film/video editor (Medical):   Marland Kitchen Lack of Transportation (Non-Medical):   Physical Activity:   . Days of Exercise per Week:   . Minutes of Exercise per Session:   Stress:   . Feeling of Stress :   Social Connections:   . Frequency of Communication with Friends and Family:   . Frequency of Social Gatherings with Friends and Family:   . Attends Religious Services:   . Active Member of Clubs or Organizations:   . Attends Archivist Meetings:   Marland Kitchen Marital Status:      Family History: The patient's family history includes Diabetes in his sister; Heart attack in his sister; Heart disease in his sister; Hyperlipidemia in his mother and sister; Hypertension in his mother, sister, and son; Lung cancer in his father; Stroke in his mother. There is no history of Colon cancer, Prostate cancer, Esophageal cancer, Stomach cancer, Rectal cancer, or Colon polyps.  ROS:   Please see the history of present illness.    No fever chills nausea vomiting syncope bleeding all other systems reviewed and are negative.  EKGs/Labs/Other Studies Reviewed:    The following studies were reviewed today: Echo-50%  EKG:  EKG is not ordered today.    Recent Labs: 02/18/2019: ALT 24; BUN 21; Creatinine, Ser 1.17; Hemoglobin 14.3; Platelets 273.0; Potassium 4.5; Sodium 137  Recent Lipid Panel    Component Value Date/Time   CHOL 143 02/18/2019 0958   TRIG 266.0 (H) 02/18/2019 0958   HDL 32.10 (L) 02/18/2019 0958   CHOLHDL 4 02/18/2019 0958   VLDL 53.2 (H) 02/18/2019 0958   LDLCALC 63 11/11/2017 0127   LDLDIRECT 62.0 02/18/2019 0958    Physical Exam:    VS:  BP (!) 160/80   Pulse 75   Ht 6\' 1"  (1.854 m)   Wt 195 lb (88.5 kg)   SpO2 92%   BMI 25.73 kg/m     Wt Readings from Last 3 Encounters:   11/29/19 195 lb (88.5 kg)  10/13/19 196 lb (88.9 kg)  09/29/19 196 lb 6.4 oz (89.1 kg)  GEN: Well nourished, well developed, in no acute distress  HEENT: normal  Neck: no JVD, carotid bruits, or masses Cardiac: RRR; no murmurs, rubs, or gallops,no edema  Respiratory:  clear to auscultation bilaterally, normal work of breathing GI: soft, nontender, nondistended, + BS MS: no deformity or atrophy  Skin: warm and dry, no rash Neuro:  Alert and Oriented x 3, Strength and sensation are intact Psych: euthymic mood, full affect   ASSESSMENT:    1. Chronic systolic heart failure ([place])   2. LBBB (left bundle branch block)   3. AAA (abdominal aortic aneurysm) without rupture ([facility])    PLAN:    In order of problems listed above:  CAD/ischemic cardiomyopathy - Proximal LAD lesion as well as diffusely diseased RCA.  No intervention performed, medical management.  EF has improved from 30 to low normal.  Excellent.  No changes made.  Medications reviewed.  Chronic resynchronization therapy/ICD/left bundle branch block -Dr. Caryl Comes.  No ACE inhibitor because of pancreatitis.  No spironolactone because of hyperkalemia carvedilol at goal.  Currently doing well.  Peripheral vascular disease with diabetes -Walking on a treadmill quite a bit.  Both legs is improved.  No claudication.-Working with primary team secondary prevention.  Very pleased with his walking.  Has a treadmill at home.  Doing well.  His right leg much better than his left.  AAA repair -2013-vascular follow-up.  Doing very well.  No changes made  Medication Adjustments/Labs and Tests Ordered: Current medicines are reviewed at length with the patient today.  Concerns regarding medicines are outlined above.  No orders of the defined types were placed in this encounter.  No orders of the defined types were placed in this encounter.   Patient Instructions  Medication Instructions:  The current medical regimen is effective;   continue present plan and medications.  *If you need a refill on your cardiac medications before your next appointment, please call your pharmacy*  Follow-Up: At [facility], you and your health needs are our priority.  As part of our continuing mission to provide you with exceptional heart care, we have created designated Provider Care Teams.  These Care Teams include your primary Cardiologist (physician) and Advanced Practice Providers (APPs -  Physician Assistants and Nurse Practitioners) who all work together to provide you with the care you need, when you need it.  We recommend signing up for the patient portal called "MyChart".  Sign up information is provided on this After Visit Summary.  MyChart is used to connect with patients for Virtual Visits (Telemedicine).  Patients are able to view lab/test results, encounter notes, upcoming appointments, etc.  Non-urgent messages can be sent to your provider as well.   To learn more about what you can do with MyChart, go to [email or web address].    Your next appointment:   12 month(s)  The format for your next appointment:   In Person  Provider:   Candee Furbish, MD   Thank you for choosing [facility]!!         Signed, Candee Furbish, MD  11/29/2019 5:31 PM    [place]

## 2019-12-09 ENCOUNTER — Other Ambulatory Visit: Payer: Self-pay | Admitting: Cardiology

## 2020-01-16 ENCOUNTER — Other Ambulatory Visit: Payer: Self-pay | Admitting: Family Medicine

## 2020-01-17 ENCOUNTER — Other Ambulatory Visit: Payer: Self-pay | Admitting: Gastroenterology

## 2020-01-17 ENCOUNTER — Other Ambulatory Visit: Payer: Self-pay | Admitting: Family Medicine

## 2020-01-21 ENCOUNTER — Other Ambulatory Visit: Payer: Self-pay | Admitting: Family Medicine

## 2020-01-25 ENCOUNTER — Other Ambulatory Visit: Payer: Self-pay | Admitting: Family Medicine

## 2020-01-25 ENCOUNTER — Ambulatory Visit (INDEPENDENT_AMBULATORY_CARE_PROVIDER_SITE_OTHER): Payer: Medicare Other | Admitting: Emergency Medicine

## 2020-01-25 DIAGNOSIS — I5022 Chronic systolic (congestive) heart failure: Secondary | ICD-10-CM

## 2020-01-25 DIAGNOSIS — I255 Ischemic cardiomyopathy: Secondary | ICD-10-CM

## 2020-01-31 LAB — CUP PACEART REMOTE DEVICE CHECK
Battery Remaining Longevity: 12 mo
Battery Remaining Percentage: 13 %
Battery Voltage: 2.71 V
Brady Statistic AP VP Percent: 15 %
Brady Statistic AP VS Percent: 1 %
Brady Statistic AS VP Percent: 84 %
Brady Statistic AS VS Percent: 1 %
Brady Statistic RA Percent Paced: 15 %
Date Time Interrogation Session: 20210924103344
HighPow Impedance: 80 Ohm
HighPow Impedance: 80 Ohm
Implantable Lead Implant Date: 20140313
Implantable Lead Implant Date: 20140313
Implantable Lead Implant Date: 20140313
Implantable Lead Location: 753858
Implantable Lead Location: 753859
Implantable Lead Location: 753860
Implantable Pulse Generator Implant Date: 20140313
Lead Channel Impedance Value: 410 Ohm
Lead Channel Impedance Value: 480 Ohm
Lead Channel Impedance Value: 900 Ohm
Lead Channel Pacing Threshold Amplitude: 0.625 V
Lead Channel Pacing Threshold Amplitude: 0.75 V
Lead Channel Pacing Threshold Amplitude: 1 V
Lead Channel Pacing Threshold Pulse Width: 0.5 ms
Lead Channel Pacing Threshold Pulse Width: 0.5 ms
Lead Channel Pacing Threshold Pulse Width: 0.6 ms
Lead Channel Sensing Intrinsic Amplitude: 11.9 mV
Lead Channel Sensing Intrinsic Amplitude: 4 mV
Lead Channel Setting Pacing Amplitude: 2 V
Lead Channel Setting Pacing Amplitude: 2 V
Lead Channel Setting Pacing Amplitude: 2.5 V
Lead Channel Setting Pacing Pulse Width: 0.5 ms
Lead Channel Setting Pacing Pulse Width: 0.6 ms
Lead Channel Setting Sensing Sensitivity: 0.5 mV
Pulse Gen Serial Number: 7097007

## 2020-01-31 NOTE — Progress Notes (Signed)
Remote ICD transmission.   

## 2020-02-04 ENCOUNTER — Other Ambulatory Visit: Payer: Self-pay | Admitting: Family Medicine

## 2020-02-04 DIAGNOSIS — G629 Polyneuropathy, unspecified: Secondary | ICD-10-CM

## 2020-02-04 DIAGNOSIS — E1159 Type 2 diabetes mellitus with other circulatory complications: Secondary | ICD-10-CM

## 2020-02-04 DIAGNOSIS — R202 Paresthesia of skin: Secondary | ICD-10-CM

## 2020-02-08 DIAGNOSIS — Z23 Encounter for immunization: Secondary | ICD-10-CM | POA: Diagnosis not present

## 2020-02-10 ENCOUNTER — Other Ambulatory Visit: Payer: Self-pay | Admitting: *Deleted

## 2020-02-16 ENCOUNTER — Other Ambulatory Visit (INDEPENDENT_AMBULATORY_CARE_PROVIDER_SITE_OTHER): Payer: Medicare Other

## 2020-02-16 ENCOUNTER — Other Ambulatory Visit: Payer: Self-pay

## 2020-02-16 DIAGNOSIS — E1159 Type 2 diabetes mellitus with other circulatory complications: Secondary | ICD-10-CM

## 2020-02-16 DIAGNOSIS — G629 Polyneuropathy, unspecified: Secondary | ICD-10-CM

## 2020-02-16 DIAGNOSIS — R202 Paresthesia of skin: Secondary | ICD-10-CM

## 2020-02-16 LAB — CBC WITH DIFFERENTIAL/PLATELET
Basophils Absolute: 0.1 10*3/uL (ref 0.0–0.1)
Basophils Relative: 1 % (ref 0.0–3.0)
Eosinophils Absolute: 0.3 10*3/uL (ref 0.0–0.7)
Eosinophils Relative: 3.4 % (ref 0.0–5.0)
HCT: 41.6 % (ref 39.0–52.0)
Hemoglobin: 14.3 g/dL (ref 13.0–17.0)
Lymphocytes Relative: 18.9 % (ref 12.0–46.0)
Lymphs Abs: 1.8 10*3/uL (ref 0.7–4.0)
MCHC: 34.5 g/dL (ref 30.0–36.0)
MCV: 97.2 fl (ref 78.0–100.0)
Monocytes Absolute: 0.8 10*3/uL (ref 0.1–1.0)
Monocytes Relative: 8 % (ref 3.0–12.0)
Neutro Abs: 6.7 10*3/uL (ref 1.4–7.7)
Neutrophils Relative %: 68.7 % (ref 43.0–77.0)
Platelets: 252 10*3/uL (ref 150.0–400.0)
RBC: 4.28 Mil/uL (ref 4.22–5.81)
RDW: 12.9 % (ref 11.5–15.5)
WBC: 9.7 10*3/uL (ref 4.0–10.5)

## 2020-02-16 LAB — HEMOGLOBIN A1C: Hgb A1c MFr Bld: 7.4 % — ABNORMAL HIGH (ref 4.6–6.5)

## 2020-02-16 LAB — COMPREHENSIVE METABOLIC PANEL
ALT: 18 U/L (ref 0–53)
AST: 16 U/L (ref 0–37)
Albumin: 4.4 g/dL (ref 3.5–5.2)
Alkaline Phosphatase: 61 U/L (ref 39–117)
BUN: 19 mg/dL (ref 6–23)
CO2: 30 mEq/L (ref 19–32)
Calcium: 9.9 mg/dL (ref 8.4–10.5)
Chloride: 101 mEq/L (ref 96–112)
Creatinine, Ser: 1.24 mg/dL (ref 0.40–1.50)
GFR: 57.17 mL/min — ABNORMAL LOW (ref >60.00)
Glucose, Bld: 124 mg/dL — ABNORMAL HIGH (ref 70–99)
Potassium: 4.9 mEq/L (ref 3.5–5.1)
Sodium: 139 mEq/L (ref 135–145)
Total Bilirubin: 0.6 mg/dL (ref 0.2–1.2)
Total Protein: 7.2 g/dL (ref 6.0–8.3)

## 2020-02-16 LAB — LIPID PANEL
Cholesterol: 142 mg/dL (ref 0–200)
HDL: 33.6 mg/dL — ABNORMAL LOW (ref >39.00)
NonHDL: 108.3
Total CHOL/HDL Ratio: 4
Triglycerides: 294 mg/dL — ABNORMAL HIGH (ref 0.0–149.0)
VLDL: 58.8 mg/dL — ABNORMAL HIGH (ref 0.0–40.0)

## 2020-02-16 LAB — VITAMIN B12: Vitamin B-12: 644 pg/mL (ref 211–911)

## 2020-02-16 LAB — LDL CHOLESTEROL, DIRECT: Direct LDL: 62 mg/dL

## 2020-02-21 ENCOUNTER — Encounter: Payer: Medicare Other | Admitting: Family Medicine

## 2020-02-23 ENCOUNTER — Ambulatory Visit (INDEPENDENT_AMBULATORY_CARE_PROVIDER_SITE_OTHER): Payer: Medicare Other

## 2020-02-23 ENCOUNTER — Other Ambulatory Visit: Payer: Self-pay

## 2020-02-23 ENCOUNTER — Other Ambulatory Visit: Payer: Self-pay | Admitting: Family Medicine

## 2020-02-23 DIAGNOSIS — Z23 Encounter for immunization: Secondary | ICD-10-CM

## 2020-02-24 ENCOUNTER — Other Ambulatory Visit: Payer: Self-pay | Admitting: Family Medicine

## 2020-03-07 ENCOUNTER — Encounter: Payer: Medicare Other | Admitting: Family Medicine

## 2020-03-11 ENCOUNTER — Other Ambulatory Visit: Payer: Self-pay | Admitting: Family Medicine

## 2020-03-13 NOTE — Telephone Encounter (Signed)
Pharmacy requests refill on: Simvastatin 40 mg  LAST REFILL: 12/11/2019 LAST OV: 08/19/2019 NEXT OV: 03/14/2020 PHARMACY: [facility] [phone] [facility]

## 2020-03-14 ENCOUNTER — Ambulatory Visit (INDEPENDENT_AMBULATORY_CARE_PROVIDER_SITE_OTHER): Payer: Medicare Other | Admitting: Family Medicine

## 2020-03-14 ENCOUNTER — Other Ambulatory Visit: Payer: Self-pay

## 2020-03-14 ENCOUNTER — Encounter: Payer: Self-pay | Admitting: Family Medicine

## 2020-03-14 VITALS — BP 160/78 | HR 68 | Temp 97.0°F | Ht 73.0 in | Wt 192.8 lb

## 2020-03-14 DIAGNOSIS — E1159 Type 2 diabetes mellitus with other circulatory complications: Secondary | ICD-10-CM | POA: Diagnosis not present

## 2020-03-14 DIAGNOSIS — E785 Hyperlipidemia, unspecified: Secondary | ICD-10-CM

## 2020-03-14 DIAGNOSIS — Z125 Encounter for screening for malignant neoplasm of prostate: Secondary | ICD-10-CM | POA: Diagnosis not present

## 2020-03-14 DIAGNOSIS — S14129S Central cord syndrome at unspecified level of cervical spinal cord, sequela: Secondary | ICD-10-CM

## 2020-03-14 DIAGNOSIS — Z0000 Encounter for general adult medical examination without abnormal findings: Secondary | ICD-10-CM

## 2020-03-14 DIAGNOSIS — Z7189 Other specified counseling: Secondary | ICD-10-CM

## 2020-03-14 DIAGNOSIS — I10 Essential (primary) hypertension: Secondary | ICD-10-CM

## 2020-03-14 DIAGNOSIS — I255 Ischemic cardiomyopathy: Secondary | ICD-10-CM

## 2020-03-14 DIAGNOSIS — Z8679 Personal history of other diseases of the circulatory system: Secondary | ICD-10-CM

## 2020-03-14 MED ORDER — ATORVASTATIN CALCIUM 20 MG PO TABS: 20.0000 mg | ORAL_TABLET | Freq: Every day | ORAL | 3 refills | Status: DC

## 2020-03-14 MED ORDER — LANTUS SOLOSTAR 100 UNIT/ML ~~LOC~~ SOPN
PEN_INJECTOR | SUBCUTANEOUS | Status: DC
Start: 2020-03-14 — End: 2020-03-29

## 2020-03-14 NOTE — Patient Instructions (Addendum)
Please call the vascular clinic/Dr. Scot Dock about follow up.  Let me know if you have trouble getting an appointment.   Please check with cardiology about follow up.   We can recheck A1c and PSA with next set of labs in about 6 months.  Thank you for your effort.  Let the pharmacy know when you know good refills.   Change simvastatin to atorvastatin.   Take care.  Glad to see you.

## 2020-03-14 NOTE — Progress Notes (Signed)
This visit occurred during the SARS-CoV-2 public health emergency.  Safety protocols were in place, including screening questions prior to the visit, additional usage of staff PPE, and extensive cleaning of exam room while observing appropriate contact time as indicated for disinfecting solutions.  His wife was recently dx'd with lung cancer.  She recently had surgery and has f/u pending.  Discussed. We talked about lung cancer screening program.  He can consider it.    Flu 2021 Shingles discussed with PNA up-to-date Tetanus 2012 covid vaccine 2021 Colon 2021 He opted for PSA screening with next set of labs.  He has intermittent nocturia x1 but not every night.   Advance directive-wife designated if patient were incapacitated.  HTN BP is clearly better at home.   No CP, SOB, BLE edema except for mild BLE edema.    Hand ROM is good but he still has tingling after prev fall.  He has h/o neuropathy from DM2.    Diabetes:  Using medications without difficulties: yes Hypoglycemic episodes:no Hyperglycemic episodes:no Feet problems: h/o neuropathy.   Blood Sugars averaging: often below 100 recently, ie 94, but he has elevated levels with carb intake ie 224 after pot pie.    He had tolerated simvastatin with amlodipine, d/w pt.  D/w pt about change to atorvastatin.    PMH and SH reviewed Meds, vitals, and allergies reviewed.   ROS: Per HPI unless specifically indicated in ROS section   GEN: nad, alert and oriented HEENT: ncat NECK: supple w/o LA CV: rrr. PULM: ctab, no inc wob ABD: soft, +bs EXT: no edema SKIN: no acute rash  Diabetic foot exam: Normal inspection No skin breakdown Callus noted without ulceration on the right foot. Dec but intact DP pulses Dec sensation to light touch and monofilament Nails normal

## 2020-03-15 ENCOUNTER — Other Ambulatory Visit: Payer: Self-pay | Admitting: Cardiology

## 2020-03-15 NOTE — Assessment & Plan Note (Signed)
I asked him to follow-up with vascular.  See after visit summary.  He will update me as needed.

## 2020-03-15 NOTE — Assessment & Plan Note (Signed)
Discussed options.  Change simvastatin to atorvastatin.  Rationale discussed with patient.  He will update me as needed.

## 2020-03-15 NOTE — Assessment & Plan Note (Signed)
Discussed that in change as described above.  We can recheck A1c periodically.  Continue work on diet and exercise.  Continue Metformin and glipizide and insulin as listed.  He agrees with plan.

## 2020-03-15 NOTE — Assessment & Plan Note (Signed)
Hand ROM is good but he still has tingling after prev fall.  He has h/o neuropathy from DM2.  He does not have new symptoms.  Routine cautions given to patient.

## 2020-03-15 NOTE — Assessment & Plan Note (Signed)
BP is clearly better at home.   No CP, SOB, BLE edema except for mild BLE edema.   Continue amlodipine hydralazine carvedilol.  He agrees to plan.

## 2020-03-15 NOTE — Assessment & Plan Note (Signed)
Flu 2021 Shingles discussed with PNA up-to-date Tetanus 2012 covid vaccine 2021 Colon 2021 He opted for PSA screening with next set of labs.  He has intermittent nocturia x1 but not every night.   Advance directive-wife designated if patient were incapacitated.

## 2020-03-15 NOTE — Assessment & Plan Note (Signed)
Advance directive- wife designated if patient were incapacitated.  

## 2020-03-28 ENCOUNTER — Other Ambulatory Visit: Payer: Self-pay | Admitting: Family Medicine

## 2020-04-04 ENCOUNTER — Other Ambulatory Visit: Payer: Self-pay

## 2020-04-04 DIAGNOSIS — I714 Abdominal aortic aneurysm, without rupture, unspecified: Secondary | ICD-10-CM

## 2020-04-04 DIAGNOSIS — I739 Peripheral vascular disease, unspecified: Secondary | ICD-10-CM

## 2020-04-10 ENCOUNTER — Ambulatory Visit (INDEPENDENT_AMBULATORY_CARE_PROVIDER_SITE_OTHER): Payer: Medicare Other | Admitting: Physician Assistant

## 2020-04-10 ENCOUNTER — Ambulatory Visit (INDEPENDENT_AMBULATORY_CARE_PROVIDER_SITE_OTHER)
Admission: RE | Admit: 2020-04-10 | Discharge: 2020-04-10 | Disposition: A | Discharge status: Home / Self Care | Payer: Medicare Other | Source: Ambulatory Visit | Attending: Surgery | Admitting: Surgery

## 2020-04-10 ENCOUNTER — Other Ambulatory Visit: Payer: Self-pay

## 2020-04-10 ENCOUNTER — Ambulatory Visit (HOSPITAL_COMMUNITY)
Admission: RE | Admit: 2020-04-10 | Discharge: 2020-04-10 | Disposition: A | Discharge status: Home / Self Care | Payer: Medicare Other | Source: Ambulatory Visit | Attending: Surgery | Admitting: Surgery

## 2020-04-10 VITALS — BP 162/82 | HR 70 | Temp 98.9°F | Resp 20 | Ht 73.0 in | Wt 189.8 lb

## 2020-04-10 DIAGNOSIS — I714 Abdominal aortic aneurysm, without rupture, unspecified: Secondary | ICD-10-CM

## 2020-04-10 DIAGNOSIS — I739 Peripheral vascular disease, unspecified: Secondary | ICD-10-CM | POA: Insufficient documentation

## 2020-04-10 DIAGNOSIS — I255 Ischemic cardiomyopathy: Secondary | ICD-10-CM

## 2020-04-10 NOTE — Progress Notes (Signed)
HISTORY AND PHYSICAL     CC:  follow up. Requesting Provider:  Tonia Ghent, MD  HPI: This is a 74 y.o. male who is here today for follow up for follow up.  He has hx of EVAR in 2013 by Dr. Scot Dock.  Pt was last seen by Dr. Scot Dock in January 2019 for evaluation of carotid disease.  His duplex revealed 1-39% bilateral ICA stenosis and he did not feel he needed routine follow up for this.    He was last seen for EVAR in October 2018 and at that time, he was having mild claudication that started after 6 minutes of walking.  He did not have any non healing wounds.  His AAA sac size measured 4cm.  He had decreased ABI on the right and was scheduled for repeat ABI at next visit.   The pt returns today for follow up.  He states he has been doing well since his last visit.  He states he had fallen a few years ago on the ice and ever since, he has some nerve damage in his feet.  He no longer has cramping in his right calf when he walks.  He does not have any new wounds or rest pain.  He is back in the gym and exercising on the treadmill and he does not have to stop and rest.  He has not had any stroke symptoms or visual changes.   The pt is on a statin for cholesterol management.    The pt is on an aspirin.    Other AC:  none The pt is on BB, CCB for hypertension.  The pt does have diabetes. Tobacco hx:  former  Pt does not have family hx of AAA.  He has a 49 y/o son and he has not been evaluated for AAA.    Past Medical History:  Diagnosis Date  . AAA (abdominal aortic aneurysm) ([place]) 2011   Per vascular surgery  . Anxiety    no med. in use for anxiety but pt. speaks openly of his stress & anxious feelings regarding impending surgery    . Atrial fibrillation ([place])   . Automatic implantable cardioverter-defibrillator in situ   . CAD (coronary artery disease)    Presumed CAD with nuclear scan October 09, 2011,  large anteroseptal MI and inferior MI. Catheterization scheduled October 15, 2011   . Cardiomyopathy [facility])    Nuclear, October 09, 2011, EF 30%, multiple focal wall motion abnormalities  . Diabetes mellitus    type II  . Drug therapy    Hyperkalemia with spironolactone  . Ejection fraction < 50%    EF 30%, nuclear, October 09, 2011  . Ejection fraction < 50%    EF previously 30%  //   EF 30-35%, echo, July 14, 2012, severe diffuse hypokinesis, PA pressure 43 mm mercury  . Fall due to ice or snow Feb.  19, 2015  . HLD (hyperlipidemia)   . Hypertension    white coat HTN-- often elevated in office and controlled on outside checks.  . ICD (implantable cardioverter-defibrillator) in place    CRT-D placed March, 2014 complete heart block and k dysfunction  . LBBB (left bundle branch block)    LBBB on EKG October 11, 2011,  no prior EKG has been done  . Low testosterone    Hx of  . Myocardial infarction ([place])    Told in 2015-never knew-showed up on stress test for AAA;   . Pacemaker   .  PAD (peripheral artery disease) ([place])   . Pancreatitis     Past Surgical History:  Procedure Laterality Date  . ABDOMINAL AORTAGRAM N/A 10/28/2011   Procedure: ABDOMINAL [name];  Surgeon: Angelia Mould, MD;  Location: [facility] CATH LAB;  Service: Cardiovascular;  Laterality: N/A;  . ABDOMINAL AORTIC ANEURYSM REPAIR     EVAR   . BI-VENTRICULAR IMPLANTABLE CARDIOVERTER DEFIBRILLATOR N/A 07/16/2012   Procedure: BI-VENTRICULAR IMPLANTABLE CARDIOVERTER DEFIBRILLATOR  (CRT-D);  Surgeon: Deboraha Sprang, MD;  Location: [facility] CATH LAB;  Service: Cardiovascular;  Laterality: N/A;  . CARDIAC CATHETERIZATION    . COLONOSCOPY  03/23/2018; 2020  . ESOPHAGOGASTRODUODENOSCOPY N/A 02/12/2019   Procedure: ESOPHAGOGASTRODUODENOSCOPY (EGD);  Surgeon: Jackquline Denmark, MD;  Location: [facility] ENDOSCOPY;  Service: Endoscopy;  Laterality: N/A;  . ESOPHAGOGASTRODUODENOSCOPY (EGD) WITH PROPOFOL N/A 02/05/2018   Procedure: ESOPHAGOGASTRODUODENOSCOPY (EGD) WITH PROPOFOL;  Surgeon: Mauri Pole, MD;  Location: [facility] ENDOSCOPY;   Service: Endoscopy;  Laterality: N/A;  . ESOPHAGOGASTRODUODENOSCOPY (EGD) WITH PROPOFOL N/A 09/27/2018   Procedure: ESOPHAGOGASTRODUODENOSCOPY (EGD) WITH PROPOFOL;  Surgeon: Carol Ada, MD;  Location: [place];  Service: Endoscopy;  Laterality: N/A;  . FOREIGN BODY REMOVAL  09/27/2018   Procedure: FOREIGN BODY REMOVAL;  Surgeon: Carol Ada, MD;  Location[facility] ENDOSCOPY;  Service: Endoscopy;;  . FOREIGN BODY REMOVAL  02/12/2019   Procedure: FOREIGN BODY REMOVAL;  Surgeon: Jackquline Denmark, MD;  Location: [facility] ENDOSCOPY;  Service: Endoscopy;;  . HERNIA REPAIR  Jan. 9, 2015  . INSERTION OF MESH N/A 05/14/2013   Procedure: INSERTION OF MESH;  Surgeon: Adin Hector, MD;  Location: [place];  Service: General;  Laterality: N/A;  . LOWER EXTREMITY ANGIOGRAM Bilateral 10/28/2011   Procedure: LOWER EXTREMITY ANGIOGRAM;  Surgeon: Angelia Mould, MD;  Location: [facility] CATH LAB;  Service: Cardiovascular;  Laterality: Bilateral;  . PACEMAKER INSERTION  07-16-12   pacemaker/defibrilator  . POLYPECTOMY    . POSTERIOR CERVICAL FUSION/FORAMINOTOMY N/A 06/09/2014   Procedure: Laminectomy - Cervical two-Cervcial four posterior cervical instrumented fusion Cervical two-cervical four;  Surgeon: Eustace Moore, MD;  Location: [place] NEURO ORS;  Service: Neurosurgery;  Laterality: N/A;  posterior   . SAVORY DILATION N/A 02/05/2018   Procedure: SAVORY DILATION;  Surgeon: Mauri Pole, MD;  Location: [facility] ENDOSCOPY;  Service: Endoscopy;  Laterality: N/A;  . TONSILLECTOMY     as a child   . UMBILICAL HERNIA REPAIR N/A 05/14/2013   Procedure: LAPAROSCOPIC exploration and repair of hernia in abdominal ;  Surgeon: Adin Hector, MD;  Location: [place];  Service: General;  Laterality: N/A;  . UPPER GASTROINTESTINAL ENDOSCOPY  2020    Allergies  Allergen Reactions  . Aldactone [Spironolactone] Other (See Comments)    Hyperkalemia  . Codeine Rash and Hives  . Januvia [Sitagliptin] Other (See Comments)    Diarrhea and  heart racing  . Lisinopril     Possible cause of pancreatitis    Current Outpatient Medications  Medication Sig Dispense Refill  . ACCU-CHEK AVIVA PLUS test strip USE TO CHECK BLOOD GLUCOSE UP TO TWICE DAILY. 200 strip 3  . amLODipine (NORVASC) 10 MG tablet TAKE 1 TABLET(10 MG) BY MOUTH DAILY 90 tablet 3  . aspirin EC 81 MG tablet Take 81 mg by mouth daily.    Marland Kitchen atorvastatin (LIPITOR) 20 MG tablet Take 1 tablet (20 mg total) by mouth daily. 90 tablet 3  . carvedilol (COREG) 25 MG tablet TAKE 1 TABLET BY MOUTH TWICE DAILY WITH MEALS 180 tablet 3  . Cholecalciferol (VITAMIN  D PO) Take 1,200 Units by mouth daily.     . Cyanocobalamin 2500 MCG CHEW Chew 1 tablet by mouth daily.    Marland Kitchen glipiZIDE (GLUCOTROL) 5 MG tablet TAKE 2 TABLETS(10 MG) BY MOUTH TWICE DAILY BEFORE A MEAL 360 tablet 2  . hydrALAZINE (APRESOLINE) 25 MG tablet TAKE 1 TABLET(25 MG) BY MOUTH THREE TIMES DAILY 90 tablet 5  . insulin glargine (LANTUS SOLOSTAR) 100 UNIT/ML Solostar Pen INJECT 0.3 TO 0.35 MLS(30 TO 35 UNITS) INTO THE SKIN EVERY DAY 15 mL 3  . metFORMIN (GLUCOPHAGE) 500 MG tablet TAKE 2 TABLETS BY MOUTH TWICE DAILY WITH FOOD 360 tablet 2  . Multiple Vitamin (MULTIVITAMIN) tablet Take 1 tablet by mouth daily.     Marland Kitchen omeprazole (PRILOSEC) 40 MG capsule TAKE 1 CAPSULE(40 MG) BY MOUTH TWICE DAILY 180 capsule 0  . sildenafil (REVATIO) 20 MG tablet TAKE THREE TO FOUR TABLETS BY MOUTH DAILY AS NEEDED 50 tablet 11   No current facility-administered medications for this visit.    Family History  Problem Relation Age of Onset  . Hypertension Mother   . Stroke Mother   . Hyperlipidemia Mother   . Diabetes Sister   . Heart disease Sister        Before age 60  . Hypertension Sister   . Hyperlipidemia Sister   . Heart attack Sister   . Lung cancer Father   . Hypertension Son   . Colon cancer Neg Hx   . Prostate cancer Neg Hx   . Esophageal cancer Neg Hx   . Stomach cancer Neg Hx   . Rectal cancer Neg Hx   . Colon  polyps Neg Hx     Social History   Socioeconomic History  . Marital status: Married    Spouse name: Precious Bard  . Number of children: 1  . Years of education: Not on file  . Highest education level: Not on file  Occupational History  . Occupation: retired  Tobacco Use  . Smoking status: Former Smoker    Packs/day: 2.00    Years: 40.00    Pack years: 80.00    Types: Cigarettes    Quit date: 05/06/2004    Years since quitting: 15.9  . Smokeless tobacco: Never Used  Vaping Use  . Vaping Use: Never used  Substance and Sexual Activity  . Alcohol use: Yes    Alcohol/week: 0.0 - 2.0 standard drinks    Comment: beer or wine  occassionally  . Drug use: No  . Sexual activity: Yes    Partners: Female  Other Topics Concern  . Not on file  Social History Narrative   [place] vet, he has known service related agent orange exposure.    Retired   Chiropractor daily.     Social Determinants of Health   Financial Resource Strain:   . Difficulty of Paying Living Expenses: Not on file  Food Insecurity:   . Worried About Charity fundraiser in the Last Year: Not on file  . Ran Out of Food in the Last Year: Not on file  Transportation Needs:   . Lack of Transportation (Medical): Not on file  . Lack of Transportation (Non-Medical): Not on file  Physical Activity:   . Days of Exercise per Week: Not on file  . Minutes of Exercise per Session: Not on file  Stress:   . Feeling of Stress : Not on file  Social Connections:   . Frequency of Communication with Friends and Family: Not on  file  . Frequency of Social Gatherings with Friends and Family: Not on file  . Attends Religious Services: Not on file  . Active Member of Clubs or Organizations: Not on file  . Attends Archivist Meetings: Not on file  . Marital Status: Not on file  Intimate Partner Violence:   . Fear of Current or Ex-Partner: Not on file  . Emotionally Abused: Not on file  . Physically Abused: Not on file  . Sexually  Abused: Not on file     REVIEW OF SYSTEMS:   [X]  denotes positive finding, [ ]  denotes negative finding Cardiac  Comments:  Chest pain or chest pressure:    Shortness of breath upon exertion:    Short of breath when lying flat:    Irregular heart rhythm:        Vascular    Pain in calf, thigh, or hip brought on by ambulation:    Pain in feet at night that wakes you up from your sleep:     Blood clot in your veins:    Leg swelling:         Pulmonary    Oxygen at home:    Productive cough:     Wheezing:         Neurologic    Sudden weakness in arms or legs:     Sudden numbness in arms or legs:     Sudden onset of difficulty speaking or slurred speech:    Temporary loss of vision in one eye:     Problems with dizziness:         Gastrointestinal    Blood in stool:     Vomited blood:         Genitourinary    Burning when urinating:     Blood in urine:        Psychiatric    Major depression:         Hematologic    Bleeding problems:    Problems with blood clotting too easily:        Skin    Rashes or ulcers:        Constitutional    Fever or chills:      PHYSICAL EXAMINATION:  Today's Vitals   04/10/20 0843  BP: (!) 162/82  Pulse: 70  Resp: 20  Temp: 98.9 F (37.2 C)  TempSrc: Temporal  SpO2: 96%  Weight: 189 lb 12.8 oz (86.1 kg)  Height: 6\' 1"  (1.854 m)   Body mass index is 25.04 kg/m.   General:  WDWN in NAD; vital signs documented above Gait: Normal HENT: WNL, normocephalic Pulmonary: normal non-labored breathing , without wheezing Cardiac: regular HR, without  Murmur; without carotid bruits Abdomen: soft, NT, no masses; aortic pulse is not palpable Skin: without rashes Vascular Exam/Pulses:  Right Left  Radial 2+ (normal) 2+ (normal)  Ulnar 2+ (normal) 2+ (normal)  Femoral 2+ (normal) 2+ (normal)  Popliteal Unable to palpate Unable to palpate  DP Unable to palpate 2+ (normal)  PT Unable to palpate Unable to palpate   Extremities:  without ischemic changes, without Gangrene , without cellulitis; without open wounds;  Musculoskeletal: no muscle wasting or atrophy  Neurologic: A&O X 3;  No focal weakness or paresthesias are detected Psychiatric:  The pt has Normal affect.   Non-Invasive Vascular Imaging:   ABI's/TBI's on 04/10/2020: Right:  0.54/0.42 - Great toe pressure: 77 Left:  0.9/0.62 - Great toe pressure: 112  EVAR arterial duplex on 04/10/2020: Maximum  aortic diameter 3.4cm No evidence of endoleak  Previous ABI's/TBI's on 02/05/2017: Right:  0.59/0.52 - Great toe pressure: 94 Left:  0.96/0.62 - Great toe pressure:  113   Previous EVAR Arterial duplex on 02/05/2017: Maximum diameter 4cm   ASSESSMENT/PLAN:: 74 y.o. male here for follow up for EVAR in 2013 by Dr. Scot Dock  AAA -pt's AAA has decreased in diameter without evidence of endoleak.  He is doing well and will f/u in one year with EVAR duplex. -discussed with pt who has 45 y/o son.  He has not been evaluated for AAA.  At some point in the future, he will need to be evaluated given the familial nature.  Pt expressed understanding.   PAD -ABI's are slightly decreased from visit 2 years ago, however, his claudication has resolved and he is able to exercise without having to stop.  Discussed with him to continue his walking program/exercise.  He does have neuropathy and discussed with him to continue to check his feet daily and if he has any changes or wounds that won't heal, rest pain, he will contact us sooner.  Otherwise, we will check his ABI in a year when he returns for EVAR duplex.    [name], [facility] [phone]  Clinic MD:   Trula Slade

## 2020-04-17 ENCOUNTER — Other Ambulatory Visit: Payer: Self-pay | Admitting: Gastroenterology

## 2020-04-25 ENCOUNTER — Ambulatory Visit (INDEPENDENT_AMBULATORY_CARE_PROVIDER_SITE_OTHER): Payer: Medicare Other

## 2020-04-25 DIAGNOSIS — I255 Ischemic cardiomyopathy: Secondary | ICD-10-CM | POA: Diagnosis not present

## 2020-04-26 ENCOUNTER — Telehealth: Payer: Self-pay

## 2020-04-26 NOTE — Telephone Encounter (Signed)
Called and left voicemail for patient to return call to office.  °

## 2020-04-26 NOTE — Telephone Encounter (Signed)
I will work on the paperwork when I get back to clinic.  If possible, please check with the patient and see if he has had any recent hypoglycemia and how often he is checking his sugars per day.  Since he is on insulin, he may already qualify for 3 times a day testing.  If he is having any episodes of hypoglycemia and that should also qualify.  Thanks.

## 2020-04-26 NOTE — Telephone Encounter (Signed)
Received fax from [facility] that requires providers signature to confirm that patient has been seen within the last six months and that patient requires CBG testing 3 times or more per day. According to orders, patient is to check his blood sugar two times a day. This is for Medicare purposes. Please advise. Paperwork has been placed on Dr. Josefine Class desk for further review and signature if appropriate.

## 2020-04-27 MED ORDER — ACCU-CHEK AVIVA PLUS VI STRP
ORAL_STRIP | Status: DC
Start: 2020-04-27 — End: 2021-01-26

## 2020-04-27 NOTE — Addendum Note (Signed)
Addended by: Tonia Ghent on: 04/27/2020 09:39 AM   Modules accepted: Orders

## 2020-04-27 NOTE — Telephone Encounter (Signed)
Faxed

## 2020-04-27 NOTE — Telephone Encounter (Signed)
Patient stated that he is currently checking his blood sugars twice a day (once in the morning and once at night). He stated that he had a hypoglycemic episode this morning with a CBG of 48. Patient stated that he rarely has this issue.

## 2020-04-27 NOTE — Telephone Encounter (Addendum)
With documented hypoglycemia, he should have the ability to check his sugar up to TID prn. I updated the paper work and EMR.  Let me know if I need to do anything else on the paperwork.  If he has more trouble with low sugars, then please let me know.  Thanks.

## 2020-04-28 LAB — CUP PACEART REMOTE DEVICE CHECK
Battery Remaining Longevity: 7 mo
Battery Remaining Percentage: 7 %
Battery Voltage: 2.65 V
Brady Statistic AP VP Percent: 14 %
Brady Statistic AP VS Percent: 1 %
Brady Statistic AS VP Percent: 85 %
Brady Statistic AS VS Percent: 1 %
Brady Statistic RA Percent Paced: 14 %
Date Time Interrogation Session: 20211222184139
HighPow Impedance: 79 Ohm
HighPow Impedance: 79 Ohm
Implantable Lead Implant Date: 20140313
Implantable Lead Implant Date: 20140313
Implantable Lead Implant Date: 20140313
Implantable Lead Location: 753858
Implantable Lead Location: 753859
Implantable Lead Location: 753860
Implantable Pulse Generator Implant Date: 20140313
Lead Channel Impedance Value: 400 Ohm
Lead Channel Impedance Value: 430 Ohm
Lead Channel Impedance Value: 790 Ohm
Lead Channel Pacing Threshold Amplitude: 0.625 V
Lead Channel Pacing Threshold Amplitude: 0.75 V
Lead Channel Pacing Threshold Amplitude: 1 V
Lead Channel Pacing Threshold Pulse Width: 0.5 ms
Lead Channel Pacing Threshold Pulse Width: 0.5 ms
Lead Channel Pacing Threshold Pulse Width: 0.6 ms
Lead Channel Sensing Intrinsic Amplitude: 11.9 mV
Lead Channel Sensing Intrinsic Amplitude: 3.6 mV
Lead Channel Setting Pacing Amplitude: 2 V
Lead Channel Setting Pacing Amplitude: 2 V
Lead Channel Setting Pacing Amplitude: 2.5 V
Lead Channel Setting Pacing Pulse Width: 0.5 ms
Lead Channel Setting Pacing Pulse Width: 0.6 ms
Lead Channel Setting Sensing Sensitivity: 0.5 mV
Pulse Gen Serial Number: 7097007

## 2020-05-02 ENCOUNTER — Telehealth: Payer: Self-pay | Admitting: Family Medicine

## 2020-05-02 NOTE — Chronic Care Management (AMB) (Signed)
  Chronic Care Management   Note  05/02/2020 Name: Luke [name] MRN: [id number] DOB: [birth year]  Luke [name] is a 74 y.o. year old male who is a primary care patient of [name], MD. I reached out to Luke [name] by phone today in response to a referral sent by Mr. ALPHONS BURGERT PCP, [name], MD.   Mr. Almquist was given information about Chronic Care Management services today including:  1. CCM service includes personalized support from designated clinical staff supervised by his physician, including individualized plan of care and coordination with other care providers 2. 24/7 contact phone numbers for assistance for urgent and routine care needs. 3. Service will only be billed when office clinical staff spend 20 minutes or more in a month to coordinate care. 4. Only one practitioner may furnish and bill the service in a calendar month. 5. The patient may stop CCM services at any time (effective at the end of the month) by phone call to the office staff.   Patient agreed to services and verbal consent obtained.   Follow up plan:   Aggie Hacker  Upstream Scheduler

## 2020-05-09 NOTE — Progress Notes (Signed)
Remote ICD transmission.   

## 2020-06-08 ENCOUNTER — Other Ambulatory Visit: Payer: Self-pay

## 2020-06-08 ENCOUNTER — Telehealth: Payer: Medicare Other | Admitting: Internal Medicine

## 2020-06-08 DIAGNOSIS — Z95810 Presence of automatic (implantable) cardiac defibrillator: Secondary | ICD-10-CM

## 2020-06-08 DIAGNOSIS — I442 Atrioventricular block, complete: Secondary | ICD-10-CM

## 2020-06-08 DIAGNOSIS — I5022 Chronic systolic (congestive) heart failure: Secondary | ICD-10-CM

## 2020-06-08 DIAGNOSIS — I255 Ischemic cardiomyopathy: Secondary | ICD-10-CM

## 2020-06-08 DIAGNOSIS — I447 Left bundle-branch block, unspecified: Secondary | ICD-10-CM

## 2020-06-09 ENCOUNTER — Telehealth: Payer: Self-pay

## 2020-06-09 NOTE — Chronic Care Management (AMB) (Addendum)
Chronic Care Management Pharmacy Assistant   Name: Luke [name]  MRN: [id number] DOB: 10/12/1945  Reason for Encounter: Initial questions for CCM visit 06/13/20  PCP : Tonia Ghent, MD  Allergies:   Allergies  Allergen Reactions   Aldactone [Spironolactone] Other (See Comments)    Hyperkalemia   Codeine Rash and Hives   Januvia [Sitagliptin] Other (See Comments)    Diarrhea and heart racing   Lisinopril     Possible cause of pancreatitis    Medications: Outpatient Encounter Medications as of 06/09/2020  Medication Sig   amLODipine (NORVASC) 10 MG tablet TAKE 1 TABLET(10 MG) BY MOUTH DAILY   aspirin EC 81 MG tablet Take 81 mg by mouth daily.   atorvastatin (LIPITOR) 20 MG tablet Take 1 tablet (20 mg total) by mouth daily.   carvedilol (COREG) 25 MG tablet TAKE 1 TABLET BY MOUTH TWICE DAILY WITH MEALS   Cholecalciferol (VITAMIN D PO) Take 1,200 Units by mouth daily.    Cyanocobalamin 2500 MCG CHEW Chew 1 tablet by mouth daily.   glipiZIDE (GLUCOTROL) 5 MG tablet TAKE 2 TABLETS(10 MG) BY MOUTH TWICE DAILY BEFORE A MEAL   glucose blood (ACCU-CHEK AVIVA PLUS) test strip USE TO CHECK BLOOD GLUCOSE UP TO 3 TIMES DAILY.   hydrALAZINE (APRESOLINE) 25 MG tablet TAKE 1 TABLET(25 MG) BY MOUTH THREE TIMES DAILY   insulin glargine (LANTUS SOLOSTAR) 100 UNIT/ML Solostar Pen INJECT 0.3 TO 0.35 MLS(30 TO 35 UNITS) INTO THE SKIN EVERY DAY   metFORMIN (GLUCOPHAGE) 500 MG tablet TAKE 2 TABLETS BY MOUTH TWICE DAILY WITH FOOD   Multiple Vitamin (MULTIVITAMIN) tablet Take 1 tablet by mouth daily.    omeprazole (PRILOSEC) 40 MG capsule TAKE 1 CAPSULE(40 MG) BY MOUTH TWICE DAILY   sildenafil (REVATIO) 20 MG tablet TAKE THREE TO FOUR TABLETS BY MOUTH DAILY AS NEEDED   No facility-administered encounter medications on file as of 06/09/2020.    Current Diagnosis: Patient Active Problem List   Diagnosis Date Noted   Pain due to onychomycosis of toenails of both feet 08/10/2019   Chronic  systolic heart failure ([place]) 06/08/2019   Biventricular ICD (implantable cardioverter-defibrillator) in place 06/08/2019   Esophageal obstruction due to food impaction    Esophageal stricture    Dysphagia 11/19/2017   CKD (chronic kidney disease), stage III ([place]) 11/10/2017   Pancreatitis 11/10/2017   Healthcare maintenance 11/22/2016   Tinnitus 05/23/2015   Medicare annual wellness visit, subsequent 10/20/2014   Advance care planning 10/20/2014   S/P cervical spinal fusion 06/09/2014   Stenosis of cervical spine with myelopathy ([place]) 12/27/2013   Preop cardiovascular exam 08/13/2013   Central cord syndrome ([place]) 06/24/2013   Encounter for fitting or adjustment of automatic implantable cardioverter-defibrillator 04/20/2013   Drug therapy    Status post abdominal aortic aneurysm repair 12/30/2012   HLD (hyperlipidemia) 09/16/2012   Ejection fraction < 50%    Complete heart block ([place]) 07/14/2012   SK (seborrheic keratosis) 02/12/2012   Shoulder pain 11/08/2011   CAD (coronary artery disease)    Ischemic cardiomyopathy    LBBB (left bundle branch block)    PVD (peripheral vascular disease) ([place]) 10/08/2010   ORGANIC IMPOTENCE 07/05/2010   Essential hypertension, benign 02/27/2010   Type 2 diabetes mellitus with vascular disease ([place]) 11/16/2009   AAA (abdominal aortic aneurysm) ([place]) 05/06/2009     Have you seen any other providers since your last visit? Yes  Luke Locket, PA-C- Vascular surgery  Any changes in your  medications or health? No  Any side effects from any medications? No  Do you have an symptoms or problems not managed by your medications? Bad feet  Any concerns about your health right now? No- states he thinks he is doing fairly well.   Has your provider asked that you check blood pressure, blood sugar, or follow special diet at home? Yes  States he checks blood glucose 2 times daily and blood pressure weekly  Do you get any type of exercise on a regular  basis? Yes States he goes to the gym 5 days a week.  Can you think of a goal you would like to reach for your health? None   Do you have any problems getting your medications? Yes Insulin expensive at beginning of year states [facility] has issues with getting test strips some times.   Is there anything that you would like to discuss during the appointment? Yes would like to discuss his diabetes and his foot pain/nerve problems.    Luke [name] was reminded to have all medications, supplements and any blood glucose and blood pressure readings available for review with Luke [name], Pharm. D, at his  In office visit on 06/13/20 at 2:00 PM .   Follow-Up:  Pharmacist Review  Luke [name], CPP notified  Luke [name], [place] Assistant [phone]  I have reviewed the care management and care coordination activities outlined in this encounter and I am certifying that I agree with the content of this note. No further action required.  Luke [name], PharmD Clinical Pharmacist [facility] [phone]

## 2020-06-12 DIAGNOSIS — E113293 Type 2 diabetes mellitus with mild nonproliferative diabetic retinopathy without macular edema, bilateral: Secondary | ICD-10-CM | POA: Diagnosis not present

## 2020-06-13 ENCOUNTER — Ambulatory Visit (INDEPENDENT_AMBULATORY_CARE_PROVIDER_SITE_OTHER): Payer: Medicare Other

## 2020-06-13 ENCOUNTER — Other Ambulatory Visit: Payer: Self-pay

## 2020-06-13 DIAGNOSIS — E1159 Type 2 diabetes mellitus with other circulatory complications: Secondary | ICD-10-CM

## 2020-06-13 DIAGNOSIS — I10 Essential (primary) hypertension: Secondary | ICD-10-CM

## 2020-06-13 NOTE — Progress Notes (Signed)
Chronic Care Management Pharmacy Note  06/23/2020 Name:  Luke [name] MRN:  [id number] DOB:  Mar 31, 1946  Subjective: Luke [name] is an 76 y.o. year old male who is a primary patient of Damita Dunnings, Elveria Rising, MD.  The CCM team was consulted for assistance with disease management and care coordination needs.    Engaged with patient face to face for initial visit in response to provider referral for pharmacy case management and/or care coordination services.   Consent to Services:  The patient was given the following information about Chronic Care Management services today, agreed to services, and gave verbal consent: 1. CCM service includes personalized support from designated clinical staff supervised by the primary care provider, including individualized plan of care and coordination with other care providers 2. 24/7 contact phone numbers for assistance for urgent and routine care needs. 3. Service will only be billed when office clinical staff spend 20 minutes or more in a month to coordinate care. 4. Only one practitioner may furnish and bill the service in a calendar month. 5.The patient may stop CCM services at any time (effective at the end of the month) by phone call to the office staff. 6. The patient will be responsible for cost sharing (co-pay) of up to 20% of the service fee (after annual deductible is met). Patient agreed to services and consent obtained.  Patient Care Team: Tonia Ghent, MD as PCP - General Angelia Mould, MD (Vascular Surgery) Arta Silence, MD (Gastroenterology) Michael Boston, MD (General Surgery) Eustace Moore, MD (Neurosurgery) Juluis Rainier [facility]) Jerline Pain, MD (Cardiology) [name], [facility] as Pharmacist (Pharmacist)  Recent office visits: 03/14/20 - PCP visit - HTN, BP is clearly better at home. Continue amlodipine hydralazine carvedilol.. Hand ROM is good but he still has tingling after prev fall.  He has h/o neuropathy from  DM2.   He had tolerated simvastatin with amlodipine, d/w pt.  D/w pt about change to atorvastatin.  Diabetic foot exam normal. Please call the vascular clinic/Dr. Scot Dock about follow up. Check A1c and PSA in 6 months.  08/19/19 - PCP visit - A1c 6.6.  He deserves a lot of credit for all of his effort.  Discussed. He can update me if having any lows, can cut back on glipizide if needed.  Recent consult visits: 04/10/20 - [facility] - AAA, decreased in diameter, doing well, f/u in 1 year. PAD, ABIs decreased, claudication resolved, able to exercise.  01/25/20 - Cardiology, Caryl Comes, not unavailable  11/29/19 - Cardiology, [place] - CAD/ischemic cardiomyopathy- Proximal LAD lesion as well as diffusely diseased RCA.  No intervention performed, medical management.  EF has improved from 30 to low normal.  Excellent.  No changes made.  ICD - Dr. Caryl Comes, No ACE inhibitor because of pancreatitis.  No spironolactone because of hyperkalemia carvedilol at goal.  Currently doing well.  Objective:  Lab Results  Component Value Date   CREATININE 1.24 02/16/2020   BUN 19 02/16/2020   GFR 57.17 (L) 02/16/2020   GFRNONAA >60 02/12/2019   GFRAA >60 02/12/2019   NA 139 02/16/2020   K 4.9 02/16/2020   CALCIUM 9.9 02/16/2020   CO2 30 02/16/2020    Lab Results  Component Value Date/Time   HGBA1C 7.4 (H) 02/16/2020 08:06 AM   HGBA1C 6.6 (A) 08/19/2019 09:16 AM   HGBA1C 7.7 (H) 02/18/2019 09:58 AM   GFR 57.17 (L) 02/16/2020 08:06 AM   GFR 61.22 02/18/2019 09:58 AM  MICROALBUR 1.7 10/07/2008 12:00 AM    Last diabetic Eye exam:  Lab Results  Component Value Date/Time   HMDIABEYEEXA Retinopathy (A) 06/17/2019 12:00 AM    Last diabetic Foot exam: 03/2020   Lab Results  Component Value Date   CHOL 142 02/16/2020   HDL 33.60 (L) 02/16/2020   LDLCALC 63 11/11/2017   LDLDIRECT 62.0 02/16/2020   TRIG 294.0 (H) 02/16/2020   CHOLHDL 4 02/16/2020    Hepatic Function Latest Ref Rng & Units  02/16/2020 02/18/2019 02/12/2018  Total Protein 6.0 - 8.3 g/dL 7.2 7.1 7.3  Albumin 3.5 - 5.2 g/dL 4.4 4.4 4.2  AST 0 - 37 U/L $Remo'16 17 15  'cajDb$ ALT 0 - 53 U/L $Remo'18 24 17  'fwiqg$ Alk Phosphatase 39 - 117 U/L 61 77 59  Total Bilirubin 0.2 - 1.2 mg/dL 0.6 0.6 0.4  Bilirubin, Direct 0.0 - 0.3 mg/dL - - -    Lab Results  Component Value Date/Time   TSH 2.75 02/12/2018 01:01 PM   TSH 2.868 07/14/2012 04:33 PM    CBC Latest Ref Rng & Units 02/16/2020 02/18/2019 02/12/2019  WBC 4.0 - 10.5 K/uL 9.7 9.3 10.4  Hemoglobin 13.0 - 17.0 g/dL 14.3 14.3 14.6  Hematocrit 39.0 - 52.0 % 41.6 42.0 43.3  Platelets 150.0 - 400.0 K/uL 252.0 273.0 289    No results found for: VD25OH  Clinical ASCVD: Yes  The 10-year ASCVD risk score Mikey Bussing DC Jr., et al., 2013) is: 58%   Values used to calculate the score:     Age: 48 years     Sex: Male     Is Non-Hispanic African American: No     Diabetic: Yes     Tobacco smoker: No     Systolic Blood Pressure: 536 mmHg     Is BP treated: Yes     HDL Cholesterol: 33.6 mg/dL     Total Cholesterol: 142 mg/dL    Depression screen [facility] 2/9 03/14/2020 02/18/2019 12/08/2017  Decreased Interest 0 0 0  Down, Depressed, Hopeless 0 0 0  PHQ - 2 Score 0 0 0  Altered sleeping 0 - -  Tired, decreased energy 0 - -  Change in appetite 0 - -  Feeling bad or failure about yourself  0 - -  Trouble concentrating 0 - -  Moving slowly or fidgety/restless 0 - -  Suicidal thoughts 0 - -  PHQ-9 Score 0 - -  Some recent data might be hidden     Social History   Tobacco Use  Smoking Status Former Smoker  . Packs/day: 2.00  . Years: 40.00  . Pack years: 80.00  . Types: Cigarettes  . Quit date: 05/06/2004  . Years since quitting: 16.1  Smokeless Tobacco Never Used   BP Readings from Last 3 Encounters:  04/10/20 (!) 162/82  03/14/20 (!) 160/78  11/29/19 (!) 160/80   Pulse Readings from Last 3 Encounters:  04/10/20 70  03/14/20 68  11/29/19 75   Wt Readings from Last 3 Encounters:   04/10/20 189 lb 12.8 oz (86.1 kg)  03/14/20 192 lb 12.8 oz (87.5 kg)  11/29/19 195 lb (88.5 kg)    Assessment/Interventions: Review of patient past medical history, allergies, medications, health status, including review of consultants reports, laboratory and other test data, was performed as part of comprehensive evaluation and provision of chronic care management services.   SDOH:  (Social Determinants of Health) assessments and interventions performed: Yes SDOH Interventions   Flowsheet Row Most Recent Value  SDOH Interventions   Financial Strain Interventions --  [Apply for Lantus assistance]      CCM Care Plan  Allergies  Allergen Reactions  . Aldactone [Spironolactone] Other (See Comments)    Hyperkalemia  . Codeine Rash and Hives  . Januvia [Sitagliptin] Other (See Comments)    Diarrhea and heart racing  . Lisinopril     Possible cause of pancreatitis    Medications Reviewed Today    Reviewed by [name], [facility] (Pharmacist) on 06/13/20 at 1445  Med List Status: <None>  Medication Order Taking? Sig Documenting Provider Last Dose Status Informant  amLODipine (NORVASC) 10 MG tablet [id number] Yes TAKE 1 TABLET(10 MG) BY MOUTH DAILY Jerline Pain, MD Taking Active   aspirin EC 81 MG tablet [id number] Yes Take 81 mg by mouth daily. [provider] Taking Active Self  atorvastatin (LIPITOR) 20 MG tablet [id number] Yes Take 1 tablet (20 mg total) by mouth daily. Tonia Ghent, MD Taking Active   carvedilol (COREG) 25 MG tablet [id number] Yes TAKE 1 TABLET BY MOUTH TWICE DAILY WITH MEALS Jerline Pain, MD Taking Active   Cholecalciferol (VITAMIN D PO) [id number] Yes Take 2,500 Units by mouth daily. 1 BID [provider] Taking Active Self  Cyanocobalamin 2500 MCG CHEW [id number] Yes Chew 1 tablet by mouth daily. [provider] Taking Active   glipiZIDE (GLUCOTROL) 5 MG tablet [id number] Yes TAKE 2 TABLETS(10 MG) BY MOUTH TWICE DAILY BEFORE A MEAL  Tonia Ghent, MD Taking Active   glucose blood (ACCU-CHEK AVIVA PLUS) test strip [id number] Yes USE TO CHECK BLOOD GLUCOSE UP TO 3 TIMES DAILY. Tonia Ghent, MD Taking Active   hydrALAZINE (APRESOLINE) 25 MG tablet [id number] Yes TAKE 1 TABLET(25 MG) BY MOUTH THREE TIMES DAILY Tonia Ghent, MD Taking Active   insulin glargine (LANTUS SOLOSTAR) 100 UNIT/ML Solostar Pen [id number] Yes INJECT 0.3 TO 0.35 MLS(30 TO 35 UNITS) INTO THE SKIN EVERY DAY Tonia Ghent, MD Taking Active   metFORMIN (GLUCOPHAGE) 500 MG tablet [id number] Yes TAKE 2 TABLETS BY MOUTH TWICE DAILY WITH FOOD Tonia Ghent, MD Taking Active   Multiple Vitamin (MULTIVITAMIN) tablet [id number] Yes Take 1 tablet by mouth daily. [provider] Taking Active Self  omeprazole (PRILOSEC) 40 MG capsule [id number] Yes TAKE 1 CAPSULE(40 MG) BY MOUTH TWICE DAILY  Patient taking differently: 40 mg daily.   Mauri Pole, MD Taking Active   sildenafil (REVATIO) 20 MG tablet [id number] Yes TAKE THREE TO FOUR TABLETS BY MOUTH DAILY AS NEEDED  Patient taking differently: 1 PRN for neuropathy in feet   Tonia Ghent, MD Taking Active           Patient Active Problem List   Diagnosis Date Noted  . Pain due to onychomycosis of toenails of both feet 08/10/2019  . Chronic systolic heart failure ([place]) 06/08/2019  . Biventricular ICD (implantable cardioverter-defibrillator) in place 06/08/2019  . Esophageal obstruction due to food impaction   . Esophageal stricture   . Dysphagia 11/19/2017  . CKD (chronic kidney disease), stage III ([place]) 11/10/2017  . Pancreatitis 11/10/2017  . Healthcare maintenance 11/22/2016  . Tinnitus 05/23/2015  . Medicare annual wellness visit, subsequent 10/20/2014  . Advance care planning 10/20/2014  . S/P cervical spinal fusion 06/09/2014  . Stenosis of cervical spine with myelopathy ([place]) 12/27/2013  . Preop cardiovascular exam 08/13/2013  . Central cord syndrome ([place]) 06/24/2013  .  Encounter for fitting or adjustment of automatic implantable cardioverter-defibrillator 04/20/2013  .  Drug therapy   . Status post abdominal aortic aneurysm repair 12/30/2012  . HLD (hyperlipidemia) 09/16/2012  . Ejection fraction < 50%   . Complete heart block ([place]) 07/14/2012  . SK (seborrheic keratosis) 02/12/2012  . Shoulder pain 11/08/2011  . CAD (coronary artery disease)   . Ischemic cardiomyopathy   . LBBB (left bundle branch block)   . PVD (peripheral vascular disease) ([place]) 10/08/2010  . ORGANIC IMPOTENCE 07/05/2010  . Essential hypertension, benign 02/27/2010  . Type 2 diabetes mellitus with vascular disease ([place]) 11/16/2009  . AAA (abdominal aortic aneurysm) ([place]) 05/06/2009    Immunization History  Administered Date(s) Administered  . Fluad Quad(high Dose 65+) 02/23/2020  . Influenza Split 01/31/2011, 02/11/2012  . Influenza Whole 02/27/2010  . Influenza,inj,Quad PF,6+ Mos 03/18/2013, 03/25/2014, 01/19/2015, 02/21/2016, 02/27/2017, 02/12/2018, 02/18/2019  . Influenza-Unspecified 03/03/2020  . PFIZER(Purple Top)SARS-COV-2 Vaccination 05/23/2019, 06/12/2019  . Pneumococcal Conjugate-13 10/18/2014  . Pneumococcal Polysaccharide-23 11/14/2011  . Td 07/05/2010    Conditions to be addressed/monitored:  Hypertension, Hyperlipidemia, Diabetes, Coronary Artery Disease and Neuropathy  Care Plan : [place]  Updates made by [name], [place] since 06/23/2020 12:00 AM    Problem: CHL AMB "PATIENT-SPECIFIC PROBLEM"   Priority: High    Long-Range Goal: Disease Management   Start Date: 06/13/2020  Expected End Date: 06/13/2021  This Visit's Progress: On track  Priority: High  Note:    Current Barriers:  . None identified  Pharmacist Clinical Goal(s):  Marland Kitchen Over the next 90 days, patient will verbalize ability to afford treatment regimen . maintain control of blood pressure as evidenced by home readings within 140/90   through collaboration with PharmD and  provider.   Interventions: . 1:1 collaboration with Tonia Ghent, MD regarding development and update of comprehensive plan of care as evidenced by provider attestation and co-signature . Inter-disciplinary care team collaboration (see longitudinal plan of care) . Comprehensive medication review performed; medication list updated in electronic medical record  Hypertension (BP goal <140/90) -controlled -Current treatment:  Amlodipine 10 mg - 1 tablet daily  Carvedilol $RemoveBef'25mg'fqejERvJjO$  - 1 tablet BID   Hydralazine 25 mg - 1 tablet TID -Medications previously tried: lisinopril (pancreatitis), aldactone (hyperkalemia); denies history of thiazide -Current home readings: 123/72 mmHg this morning, usually 120-130 SBP, checks usually once a week, Omron arm cuff  Reports long history of home readings much lower than in clinic. -Current exercise habits: gym 5 days per week -Denies hypotensive/hypertensive symptoms -Educated on Importance of home blood pressure monitoring; Proper BP monitoring technique; -Counseled to continue monitoring BP at home weekly, document, and provide log at future appointments -Recommended to continue current medication  Hyperlipidemia/CAD: (LDL goal < 70) -controlled -Current treatment:  Atorvastatin 20 mg - 1 tablet daily  Aspirin 81 mg - 1 tablet daily -Medications previously tried: simvastatin, changed due to drug interaction - doing well on atorvastatin so far -Educated on Cholesterol goals;  -Recommended to continue current medication  Diabetes (A1c goal <7%) -uncontrolled -Current medications:  Lantus 100 units/mL - 30 units daily  Metformin $RemoveBe'500mg'mjihLLzyf$  - 2 tablets BID with meals  Glipizide 5 mg - 2 tablets BID before meals  -Medications previously tried: none -Current home glucose readings - checks twice daily . fasting glucose: 80-130 . post prandial glucose: < 180 (140 average) . Hypoglycemia - 4 AM clammy, 60s, eats a couple cookies. None in past month,  Maybe twice in past 6 months. . Reports started insulin 2019 due to A1c consistently above 8% and could  not get it down   -Reports hypoglycemic symptoms rare. Denies hyperglycemic symptoms -Current meal patterns: < 200 gm/carbs per day tries to keep under 50 gm at BF, 50 lunch, 100 supper, cookie before bedtime so it doesn't drop overnight  -Educated onCV Benefits of Jardiance. Not a likely candidate for GLP-1 due to history of pancreatitis with lisinopril -Counseled to check feet daily and get yearly eye exams -Recommended to continue current medication Counseled on hypoglycemia prevention and treatment   .  Neuropathy (Goal: Relieve symptoms) -controlled - reports numbness but no pain, prefers to manage without medication -Current treatment  . No pharmacotherapy -Medications previously tried: gabapentin - imbalance/dizziness  - Exercising regularly  -Recommended  continue exercise  Patient Goals/Self-Care Activities . Over the next 90 days, patient will:  - take medications as prescribed check blood pressure weekly, document, and provide at future appointments  Follow Up Plan: Telephone follow up appointment with care management team member scheduled for: 6 months     Medication Assistance:Reports cost of insulin is high.  Application for Lantus  medication assistance program. in process.  Anticipated assistance start date 07/04/2020.  See plan of care for additional detail.  Patient's preferred pharmacy is:  [id number] [address], [address] [place] [address] Phone: [phone] Fax: [phone]  Pt endorses compliance. Refills are timely.  We discussed: Current pharmacy is preferred with insurance plan and patient is satisfied with pharmacy services Patient decided to: Continue current medication management strategy  Follow Up:  Patient agrees to Care Plan and Follow-up.  [name],  PharmD Clinical Pharmacist [facility] [phone]

## 2020-06-23 ENCOUNTER — Telehealth: Payer: Self-pay

## 2020-06-23 NOTE — Patient Instructions (Signed)
Dear Luke [name],  It was a pleasure meeting you during our initial appointment on June 13, 2020. Below is a summary of the goals we discussed and components of chronic care management. Please contact me anytime with questions or concerns.   Visit Information  Patient Care Plan: CCM Pharmacy Care Plan    Problem Identified: CHL AMB "PATIENT-SPECIFIC PROBLEM"   Priority: High    Long-Range Goal: Disease Management   Start Date: 06/13/2020  Expected End Date: 06/13/2021  This Visit's Progress: On track  Priority: High  Note:    Current Barriers:  . None identified  Pharmacist Clinical Goal(s):  Marland Kitchen Over the next 90 days, patient will verbalize ability to afford treatment regimen . maintain control of blood pressure as evidenced by home readings within 140/90   through collaboration with PharmD and provider.   Interventions: . 1:1 collaboration with Tonia Ghent, MD regarding development and update of comprehensive plan of care as evidenced by provider attestation and co-signature . Inter-disciplinary care team collaboration (see longitudinal plan of care) . Comprehensive medication review performed; medication list updated in electronic medical record  Hypertension (BP goal <140/90) -controlled -Current treatment:  Amlodipine 10 mg - 1 tablet daily  Carvedilol $RemoveBef'25mg'BYIIHofhpf$  - 1 tablet BID   Hydralazine 25 mg - 1 tablet TID -Medications previously tried: lisinopril (pancreatitis), aldactone (hyperkalemia); denies history of thiazide -Current home readings: 123/72 mmHg this morning, usually 120-130 SBP, checks usually once a week, Omron arm cuff  Reports long history of home readings much lower than in clinic. -Current exercise habits: gym 5 days per week -Denies hypotensive/hypertensive symptoms -Educated on Importance of home blood pressure monitoring; Proper BP monitoring technique; -Counseled to continue monitoring BP at home weekly, document, and provide log at future  appointments -Recommended to continue current medication  Hyperlipidemia/CAD: (LDL goal < 70) -controlled -Current treatment:  Atorvastatin 20 mg - 1 tablet daily  Aspirin 81 mg - 1 tablet daily -Medications previously tried: simvastatin, changed due to drug interaction - doing well on atorvastatin so far -Educated on Cholesterol goals;  -Recommended to continue current medication  Diabetes (A1c goal <7%) -uncontrolled -Current medications:  Lantus 100 units/mL - 30 units daily  Metformin $RemoveBe'500mg'tbXRofJpa$  - 2 tablets BID with meals  Glipizide 5 mg - 2 tablets BID before meals  -Medications previously tried: none -Current home glucose readings - checks twice daily . fasting glucose: 80-130 . post prandial glucose: < 180 (140 average) . Hypoglycemia - 4 AM clammy, 60s, eats a couple cookies. None in past month, Maybe twice in past 6 months. . Reports started insulin 2019 due to A1c consistently above 8% and could not get it down   -Reports hypoglycemic symptoms rare. Denies hyperglycemic symptoms -Current meal patterns: < 200 gm/carbs per day tries to keep under 50 gm at BF, 50 lunch, 100 supper, cookie before bedtime so it doesn't drop overnight  -Educated onCV Benefits of Jardiance. Not a likely candidate for GLP-1 due to history of pancreatitis with lisinopril -Counseled to check feet daily and get yearly eye exams -Recommended to continue current medication Counseled on hypoglycemia prevention and treatment   .  Neuropathy (Goal: Relieve symptoms) -controlled - reports numbness but no pain, prefers to manage without medication -Current treatment  . No pharmacotherapy -Medications previously tried: gabapentin - imbalance/dizziness  - Exercising regularly  -Recommended  continue exercise  Patient Goals/Self-Care Activities . Over the next 90 days, patient will:  - take medications as prescribed check blood pressure weekly, document,  and provide at future appointments  Follow Up  Plan: Telephone follow up appointment with care management team member scheduled for: 6 months     Luke [name] was given information about Chronic Care Management services today including:  1. CCM service includes personalized support from designated clinical staff supervised by his physician, including individualized plan of care and coordination with other care providers 2. 24/7 contact phone numbers for assistance for urgent and routine care needs. 3. Standard insurance, coinsurance, copays and deductibles apply for chronic care management only during months in which we provide at least 20 minutes of these services. Most insurances cover these services at 100%, however patients may be responsible for any copay, coinsurance and/or deductible if applicable. This service may help you avoid the need for more expensive face-to-face services. 4. Only one practitioner may furnish and bill the service in a calendar month. 5. The patient may stop CCM services at any time (effective at the end of the month) by phone call to the office staff.  Patient agreed to services and verbal consent obtained.   The patient verbalized understanding of instructions, educational materials, and care plan provided today and agreed to receive a mailed copy of patient instructions, educational materials, and care plan.   [name], PharmD Clinical Pharmacist [facility] at [facility] [phone]   Preventing Hypoglycemia Hypoglycemia occurs when the level of sugar (glucose) in the blood is too low. Hypoglycemia can happen in people who do or do not have diabetes (diabetes mellitus). It can develop quickly, and it can be a medical emergency. For most people with diabetes, a blood glucose level below 70 mg/dL (3.9 mmol/L) is considered hypoglycemia. Glucose is a type of sugar that provides the body's main source of energy. Certain hormones (insulin and glucagon) control the level of glucose in the blood.  Insulin lowers blood glucose, and glucagon increases blood glucose. Hypoglycemia can result from having too much insulin in the bloodstream, or from not eating enough food that contains glucose. Your risk for hypoglycemia is higher:  If you take insulin or diabetes medicines to help lower your blood glucose or help your body make more insulin.  If you skip or delay a meal or snack.  If you are ill.  During and after exercise. You can prevent hypoglycemia by working with your health care provider to adjust your meal plan as needed and by taking other precautions. How can hypoglycemia affect me? Mild symptoms Mild hypoglycemia may not cause any symptoms. If you do have symptoms, they may include:  Hunger.  Anxiety.  Sweating and feeling clammy.  Dizziness or feeling light-headed.  Sleepiness.  Nausea.  Increased heart rate.  Headache.  Blurry vision.  Irritability.  Tingling or numbness around the mouth, lips, or tongue.  A change in coordination.  Restless sleep. If mild hypoglycemia is not recognized and treated, it can quickly become moderate or severe hypoglycemia. Moderate symptoms Moderate hypoglycemia can cause:  Mental confusion and poor judgment.  Behavior changes.  Weakness.  Irregular heartbeat. Severe symptoms Severe hypoglycemia is a medical emergency. It can cause:  Fainting.  Seizures.  Loss of consciousness (coma).  Death. What nutrition changes can be made?  Work with your health care provider or diet and nutrition specialist (dietitian) to make a healthy meal plan that is right for you. Follow your meal plan carefully.  Eat meals at regular times.  If recommended by your health care provider, have snacks between meals.  Donot skip or delay  meals or snacks. You can be at risk for hypoglycemia if you are not getting enough carbohydrates. What lifestyle changes can be made?  Work closely with your health care provider to manage your  blood glucose. Make sure you know: ? Your goal blood glucose levels. ? How and when to check your blood glucose. ? The symptoms of hypoglycemia. It is important to treat it right away to keep it from becoming severe.  Do not drink alcohol on an empty stomach.  When you are ill, check your blood glucose more often than usual. Follow your sick day plan whenever you cannot eat or drink normally. Make this plan in advance with your health care provider.  Always check your blood glucose before, during, and after exercise.   How is this treated? This condition can often be treated by immediately eating or drinking something that contains sugar, such as:  Fruit juice, 4-6 oz (120-150 mL).  Regular (not diet) soda, 4-6 oz (120-150 mL).  Low-fat milk, 4 oz (120 mL).  Several pieces of hard candy.  Sugar or honey, 1 Tbsp (15 mL). Treating hypoglycemia if you have diabetes If you are alert and able to swallow safely, follow the 15:15 rule:  Take 15 grams of a rapid-acting carbohydrate. Talk with your health care provider about how much you should take.  Rapid-acting options include: ? Glucose pills (take 15 grams). ? 6-8 pieces of hard candy. ? 4-6 oz (120-150 mL) of fruit juice. ? 4-6 oz (120-150 mL) of regular (not diet) soda.  Check your blood glucose 15 minutes after you take the carbohydrate.  If the repeat blood glucose level is still at or below 70 mg/dL (3.9 mmol/L), take 15 grams of a carbohydrate again.  If your blood glucose level does not increase above 70 mg/dL (3.9 mmol/L) after 3 tries, seek emergency medical care.  After your blood glucose level returns to normal, eat a meal or a snack within 1 hour. Treating severe hypoglycemia Severe hypoglycemia is when your blood glucose level is at or below 54 mg/dL (3 mmol/L). Severe hypoglycemia is a medical emergency. Get medical help right away. If you have severe hypoglycemia and you cannot eat or drink, you may need an  injection of glucagon. A family member or close friend should learn how to check your blood glucose and how to give you a glucagon injection. Ask your health care provider if you need to have an emergency glucagon injection kit available. Severe hypoglycemia may need to be treated in a hospital. The treatment may include getting glucose through an IV. You may also need treatment for the cause of your hypoglycemia. Where to find more information  American Diabetes Association: www.diabetes.[employer] of Diabetes and Digestive and Kidney Diseases: [employer] Contact a health care provider if:  You have problems keeping your blood glucose in your target range.  You have frequent episodes of hypoglycemia. Get help right away if:  You continue to have hypoglycemia symptoms after eating or drinking something containing glucose.  Your blood glucose level is at or below 54 mg/dL (3 mmol/L).  You faint.  You have a seizure. These symptoms may represent a serious problem that is an emergency. Do not wait to see if the symptoms will go away. Get medical help right away. Call your local emergency services (911 in the [place] Summary  Know the symptoms of hypoglycemia, and when you are at risk for it (such as during exercise or when you are sick).  Check your blood glucose often when you are at risk for hypoglycemia.  Hypoglycemia can develop quickly, and it can be dangerous if it is not treated right away. If you have a history of severe hypoglycemia, make sure you know how to use your glucagon injection kit.  Make sure you know how to treat hypoglycemia. Keep a carbohydrate snack available when you may be at risk for hypoglycemia. This information is not intended to replace advice given to you by your health care provider. Make sure you discuss any questions you have with your health care provider. Document Revised: 08/14/2018 Document Reviewed: 12/18/2016 Elsevier Patient  Education  2021 [employer].

## 2020-06-23 NOTE — Chronic Care Management (AMB) (Addendum)
Chronic Care Management Pharmacy Assistant   Name: Luke [name]  MRN: [id number] DOB: 1945/10/26  Reason for Encounter: Patient Assistance Program- Lantus  PCP : Tonia Ghent, MD  Allergies:   Allergies  Allergen Reactions   Aldactone [Spironolactone] Other (See Comments)    Hyperkalemia   Codeine Rash and Hives   Januvia [Sitagliptin] Other (See Comments)    Diarrhea and heart racing   Lisinopril     Possible cause of pancreatitis    Medications: Outpatient Encounter Medications as of 06/23/2020  Medication Sig   amLODipine (NORVASC) 10 MG tablet TAKE 1 TABLET(10 MG) BY MOUTH DAILY   aspirin EC 81 MG tablet Take 81 mg by mouth daily.   atorvastatin (LIPITOR) 20 MG tablet Take 1 tablet (20 mg total) by mouth daily.   carvedilol (COREG) 25 MG tablet TAKE 1 TABLET BY MOUTH TWICE DAILY WITH MEALS   Cholecalciferol (VITAMIN D PO) Take 2,500 Units by mouth daily. 1 BID   Cyanocobalamin 2500 MCG CHEW Chew 1 tablet by mouth daily.   glipiZIDE (GLUCOTROL) 5 MG tablet TAKE 2 TABLETS(10 MG) BY MOUTH TWICE DAILY BEFORE A MEAL   glucose blood (ACCU-CHEK AVIVA PLUS) test strip USE TO CHECK BLOOD GLUCOSE UP TO 3 TIMES DAILY.   hydrALAZINE (APRESOLINE) 25 MG tablet TAKE 1 TABLET(25 MG) BY MOUTH THREE TIMES DAILY   insulin glargine (LANTUS SOLOSTAR) 100 UNIT/ML Solostar Pen INJECT 0.3 TO 0.35 MLS(30 TO 35 UNITS) INTO THE SKIN EVERY DAY   metFORMIN (GLUCOPHAGE) 500 MG tablet TAKE 2 TABLETS BY MOUTH TWICE DAILY WITH FOOD   Multiple Vitamin (MULTIVITAMIN) tablet Take 1 tablet by mouth daily.   omeprazole (PRILOSEC) 40 MG capsule TAKE 1 CAPSULE(40 MG) BY MOUTH TWICE DAILY (Patient taking differently: 40 mg daily.)   sildenafil (REVATIO) 20 MG tablet TAKE THREE TO FOUR TABLETS BY MOUTH DAILY AS NEEDED (Patient taking differently: 1 PRN for neuropathy in feet)   No facility-administered encounter medications on file as of 06/23/2020.    Current Diagnosis: Patient Active Problem List    Diagnosis Date Noted   Pain due to onychomycosis of toenails of both feet 76/16/0737   Chronic systolic heart failure ([place]) 06/08/2019   Biventricular ICD (implantable cardioverter-defibrillator) in place 06/08/2019   Esophageal obstruction due to food impaction    Esophageal stricture    Dysphagia 11/19/2017   CKD (chronic kidney disease), stage III ([place]) 11/10/2017   Pancreatitis 11/10/2017   Healthcare maintenance 11/22/2016   Tinnitus 05/23/2015   Medicare annual wellness visit, subsequent 10/20/2014   Advance care planning 10/20/2014   S/P cervical spinal fusion 06/09/2014   Stenosis of cervical spine with myelopathy ([place]) 12/27/2013   Preop cardiovascular exam 08/13/2013   Central cord syndrome ([place]) 06/24/2013   Encounter for fitting or adjustment of automatic implantable cardioverter-defibrillator 04/20/2013   Drug therapy    Status post abdominal aortic aneurysm repair 12/30/2012   HLD (hyperlipidemia) 09/16/2012   Ejection fraction < 50%    Complete heart block ([place]) 07/14/2012   SK (seborrheic keratosis) 02/12/2012   Shoulder pain 11/08/2011   CAD (coronary artery disease)    Ischemic cardiomyopathy    LBBB (left bundle branch block)    PVD (peripheral vascular disease) ([place]) 10/08/2010   ORGANIC IMPOTENCE 07/05/2010   Essential hypertension, benign 02/27/2010   Type 2 diabetes mellitus with vascular disease ([place]) 11/16/2009   AAA (abdominal aortic aneurysm) ([place]) 05/06/2009   Contacted [employer] to get update on patients application status for Lantus. Per  representative patient approved as of 06/23/20 through 05/05/21. Medication will be shipped to the office at 5-7 business days. Patient was called and informed of the approval. Office will contact once medication is received.   Follow-Up:  Patient Assistance Coordination and Pharmacist Review  [name], CPP notified  [name], [place] Assistant [phone]  I have reviewed the care  management and care coordination activities outlined in this encounter and I am certifying that I agree with the content of this note. No further action required.  [name], PharmD Clinical Pharmacist [facility] at [facility] [phone]

## 2020-06-28 ENCOUNTER — Ambulatory Visit (INDEPENDENT_AMBULATORY_CARE_PROVIDER_SITE_OTHER): Payer: Medicare Other | Admitting: Internal Medicine

## 2020-06-28 ENCOUNTER — Ambulatory Visit
Admission: RE | Admit: 2020-06-28 | Discharge: 2020-06-28 | Disposition: A | Discharge status: Home / Self Care | Payer: Medicare Other | Source: Ambulatory Visit | Attending: Internal Medicine | Admitting: Internal Medicine

## 2020-06-28 ENCOUNTER — Other Ambulatory Visit: Payer: Self-pay

## 2020-06-28 VITALS — BP 162/70 | HR 68 | Ht 73.0 in | Wt 198.0 lb

## 2020-06-28 DIAGNOSIS — I255 Ischemic cardiomyopathy: Secondary | ICD-10-CM | POA: Diagnosis not present

## 2020-06-28 DIAGNOSIS — T82897S Other specified complication of cardiac prosthetic devices, implants and grafts, sequela: Secondary | ICD-10-CM

## 2020-06-28 DIAGNOSIS — I5022 Chronic systolic (congestive) heart failure: Secondary | ICD-10-CM

## 2020-06-28 DIAGNOSIS — I442 Atrioventricular block, complete: Secondary | ICD-10-CM | POA: Diagnosis not present

## 2020-06-28 DIAGNOSIS — R918 Other nonspecific abnormal finding of lung field: Secondary | ICD-10-CM | POA: Diagnosis not present

## 2020-06-28 DIAGNOSIS — I447 Left bundle-branch block, unspecified: Secondary | ICD-10-CM | POA: Diagnosis not present

## 2020-06-28 DIAGNOSIS — Z95810 Presence of automatic (implantable) cardiac defibrillator: Secondary | ICD-10-CM

## 2020-06-28 DIAGNOSIS — J438 Other emphysema: Secondary | ICD-10-CM | POA: Diagnosis not present

## 2020-06-28 DIAGNOSIS — I700 Atherosclerosis of aorta: Secondary | ICD-10-CM | POA: Diagnosis not present

## 2020-06-28 NOTE — Progress Notes (Signed)
Patient Care Team: Tonia Ghent, MD as PCP - General Angelia Mould, MD (Vascular Surgery) Arta Silence, MD (Gastroenterology) Michael Boston, MD (General Surgery) Eustace Moore, MD (Neurosurgery) Idolina Primer [facility]) Jerline Pain, MD (Cardiology) [name], [facility] as Pharmacist (Pharmacist)   HPI  Luke [name] is a 75 y.o. male Seen in followup for an CRT-  ICD St Jude  implanted for intermittent complete heart block in the setting of ischemic cardiomyopathy and left bundle branch block.  LHC 2013  2V CAD Left anterior descending (LAD): focal 70-80% proximal. Long 40% mid vessel. Right coronary artery (RCA): Diffusely diseased. 50-60% mid vessel, 70% distal, 70% at bifurcation of PDA/PLOM.    DATE TEST EF   6/13 LHC  pLAD70-80; RCA diff 50-60%  3/14 Echo   30-35 %   3/19 Echo  55-65%      Date Cr K Hgb  10/20 1.17 4.5 14.3   10/21 1.24 4.9 14.3   The patient denies chest pain, shortness of breath, nocturnal dyspnea, orthopnea or peripheral edema.  There have been no palpitations, lightheadedness or syncope  . BP at home 125s    Past Medical History:  Diagnosis Date  . AAA (abdominal aortic aneurysm) ([place]) 2011   Per vascular surgery  . Anxiety    no med. in use for anxiety but pt. speaks openly of his stress & anxious feelings regarding impending surgery    . Atrial fibrillation ([place])   . Automatic implantable cardioverter-defibrillator in situ   . CAD (coronary artery disease)    Presumed CAD with nuclear scan October 09, 2011,  large anteroseptal MI and inferior MI. Catheterization scheduled October 15, 2011  . Cardiomyopathy [facility])    Nuclear, October 09, 2011, EF 30%, multiple focal wall motion abnormalities  . Diabetes mellitus    type II  . Drug therapy    Hyperkalemia with spironolactone  . Ejection fraction < 50%    EF 30%, nuclear, October 09, 2011  . Ejection fraction < 50%    EF previously 30%  //   EF 30-35%, echo, July 14, 2012,  severe diffuse hypokinesis, PA pressure 43 mm mercury  . Fall due to ice or snow Feb.  19, 2015  . HLD (hyperlipidemia)   . Hypertension    white coat HTN-- often elevated in office and controlled on outside checks.  . ICD (implantable cardioverter-defibrillator) in place    CRT-D placed March, 2014 complete heart block and k dysfunction  . LBBB (left bundle branch block)    LBBB on EKG October 11, 2011,  no prior EKG has been done  . Low testosterone    Hx of  . Myocardial infarction ([place])    Told in 2015-never knew-showed up on stress test for AAA;   . Pacemaker   . PAD (peripheral artery disease) ([place])   . Pancreatitis     Past Surgical History:  Procedure Laterality Date  . ABDOMINAL AORTAGRAM N/A 10/28/2011   Procedure: ABDOMINAL [name];  Surgeon: Angelia Mould, MD;  Location: [facility] CATH LAB;  Service: Cardiovascular;  Laterality: N/A;  . ABDOMINAL AORTIC ANEURYSM REPAIR     EVAR   . BI-VENTRICULAR IMPLANTABLE CARDIOVERTER DEFIBRILLATOR N/A 07/16/2012   Procedure: BI-VENTRICULAR IMPLANTABLE CARDIOVERTER DEFIBRILLATOR  (CRT-D);  Surgeon: Deboraha Sprang, MD;  Location: [facility] CATH LAB;  Service: Cardiovascular;  Laterality: N/A;  . CARDIAC CATHETERIZATION    . COLONOSCOPY  03/23/2018; 2020  . ESOPHAGOGASTRODUODENOSCOPY N/A 02/12/2019  Procedure: ESOPHAGOGASTRODUODENOSCOPY (EGD);  Surgeon: Jackquline Denmark, MD;  Location[facility] ENDOSCOPY;  Service: Endoscopy;  Laterality: N/A;  . ESOPHAGOGASTRODUODENOSCOPY (EGD) WITH PROPOFOL N/A 02/05/2018   Procedure: ESOPHAGOGASTRODUODENOSCOPY (EGD) WITH PROPOFOL;  Surgeon: Mauri Pole, MD;  Location: [facility] ENDOSCOPY;  Service: Endoscopy;  Laterality: N/A;  . ESOPHAGOGASTRODUODENOSCOPY (EGD) WITH PROPOFOL N/A 09/27/2018   Procedure: ESOPHAGOGASTRODUODENOSCOPY (EGD) WITH PROPOFOL;  Surgeon: Carol Ada, MD;  Location: [place];  Service: Endoscopy;  Laterality: N/A;  . FOREIGN BODY REMOVAL  09/27/2018   Procedure: FOREIGN BODY REMOVAL;   Surgeon: Carol Ada, MD;  Location: [facility] ENDOSCOPY;  Service: Endoscopy;;  . FOREIGN BODY REMOVAL  02/12/2019   Procedure: FOREIGN BODY REMOVAL;  Surgeon: Jackquline Denmark, MD;  Location: [facility] ENDOSCOPY;  Service: Endoscopy;;  . HERNIA REPAIR  Jan. 9, 2015  . INSERTION OF MESH N/A 05/14/2013   Procedure: INSERTION OF MESH;  Surgeon: Adin Hector, MD;  Location: [place];  Service: General;  Laterality: N/A;  . LOWER EXTREMITY ANGIOGRAM Bilateral 10/28/2011   Procedure: LOWER EXTREMITY ANGIOGRAM;  Surgeon: Angelia Mould, MD;  Location[facility] CATH LAB;  Service: Cardiovascular;  Laterality: Bilateral;  . PACEMAKER INSERTION  07-16-12   pacemaker/defibrilator  . POLYPECTOMY    . POSTERIOR CERVICAL FUSION/FORAMINOTOMY N/A 06/09/2014   Procedure: Laminectomy - Cervical two-Cervcial four posterior cervical instrumented fusion Cervical two-cervical four;  Surgeon: Eustace Moore, MD;  Location: [place] NEURO ORS;  Service: Neurosurgery;  Laterality: N/A;  posterior   . SAVORY DILATION N/A 02/05/2018   Procedure: SAVORY DILATION;  Surgeon: Mauri Pole, MD;  Location: [facility] ENDOSCOPY;  Service: Endoscopy;  Laterality: N/A;  . TONSILLECTOMY     as a child   . UMBILICAL HERNIA REPAIR N/A 05/14/2013   Procedure: LAPAROSCOPIC exploration and repair of hernia in abdominal ;  Surgeon: Adin Hector, MD;  Location: [place];  Service: General;  Laterality: N/A;  . UPPER GASTROINTESTINAL ENDOSCOPY  2020    Current Outpatient Medications  Medication Sig Dispense Refill  . amLODipine (NORVASC) 10 MG tablet TAKE 1 TABLET(10 MG) BY MOUTH DAILY 90 tablet 3  . aspirin EC 81 MG tablet Take 81 mg by mouth daily.    Marland Kitchen atorvastatin (LIPITOR) 20 MG tablet Take 1 tablet (20 mg total) by mouth daily. 90 tablet 3  . carvedilol (COREG) 25 MG tablet TAKE 1 TABLET BY MOUTH TWICE DAILY WITH MEALS 180 tablet 3  . Cholecalciferol (VITAMIN D PO) Take 2,500 Units by mouth daily. 1 BID    . Cyanocobalamin 2500 MCG CHEW Chew 1 tablet by  mouth daily.    Marland Kitchen glipiZIDE (GLUCOTROL) 5 MG tablet TAKE 2 TABLETS(10 MG) BY MOUTH TWICE DAILY BEFORE A MEAL 360 tablet 2  . glucose blood (ACCU-CHEK AVIVA PLUS) test strip USE TO CHECK BLOOD GLUCOSE UP TO 3 TIMES DAILY.    . hydrALAZINE (APRESOLINE) 25 MG tablet TAKE 1 TABLET(25 MG) BY MOUTH THREE TIMES DAILY 90 tablet 5  . insulin glargine (LANTUS SOLOSTAR) 100 UNIT/ML Solostar Pen INJECT 0.3 TO 0.35 MLS(30 TO 35 UNITS) INTO THE SKIN EVERY DAY 15 mL 3  . metFORMIN (GLUCOPHAGE) 500 MG tablet TAKE 2 TABLETS BY MOUTH TWICE DAILY WITH FOOD 360 tablet 2  . Multiple Vitamin (MULTIVITAMIN) tablet Take 1 tablet by mouth daily.    Marland Kitchen omeprazole (PRILOSEC) 40 MG capsule TAKE 1 CAPSULE(40 MG) BY MOUTH TWICE DAILY (Patient taking differently: 40 mg daily.) 180 capsule 0  . sildenafil (REVATIO) 20 MG tablet TAKE THREE TO FOUR  TABLETS BY MOUTH DAILY AS NEEDED (Patient taking differently: 1 PRN for neuropathy in feet) 50 tablet 11   No current facility-administered medications for this visit.    Allergies  Allergen Reactions  . Aldactone [Spironolactone] Other (See Comments)    Hyperkalemia  . Codeine Rash and Hives  . Januvia [Sitagliptin] Other (See Comments)    Diarrhea and heart racing  . Lisinopril     Possible cause of pancreatitis    Review of Systems negative except from HPI and PMH  Physical Exam BP (!) 162/70   Pulse 68   Ht 6\' 1"  (1.854 m)   Wt 198 lb (89.8 kg)   BMI 26.12 kg/m  Well developed and well nourished in no acute distress HENT normal Neck supple with JVP-flat Clear Device pocket well healed; without hematoma or erythema.  There is no tethering  Regular rate and rhythm, no  murmur Abd-soft with active BS No Clubbing cyanosis   edema Skin-warm and dry A & Oriented  Grossly normal sensory and motor function  ECG sinus w P-synchronous/ AV  pacing Upright QRS lead V1 and neg Lead 1  Assessment and  Plan  Ischemic Cardiomyopathy    Chronic HF systolic     Implantable Defibrillator CRT  St Jude  .     Complete heart Block   Hyperlipidemia  Diabetes  Hypertension   Diaphragmatic stimulation   Device approaching ERI  Demonstrated vibratory alert   He has new onset diaphragmatic stimulation which would suggest microdislodgment of the LV lead - macro unlikely 2/2 ECG being largely unchanged  Will get CXR  Been some time since LV function assessment but with double benefit with HFpEF and DM would agree with thoughts of SGLT-- discussed with pharmD and Dr MS both of whom are in agreement  Without symptoms of ischemia  Euvolemic continue current meds

## 2020-06-28 NOTE — Patient Instructions (Addendum)
Medication Instructions:  Your physician recommends that you continue on your current medications as directed. Please refer to the Current Medication list given to you today.  *If you need a refill on your cardiac medications before your next appointment, please call your pharmacy*   Lab Work: None ordered.  If you have labs (blood work) drawn today and your tests are completely normal, you will receive your results only by: Marland Kitchen MyChart Message (if you have MyChart) OR . A paper copy in the mail If you have any lab test that is abnormal or we need to change your treatment, we will call you to review the results.   Testing/Procedures: Your Dr would like for you to have a chest xray.  You may walk in for this test at [place]: At [facility], you and your health needs are our priority.  As part of our continuing mission to provide you with exceptional heart care, we have created designated Provider Care Teams.  These Care Teams include your primary Cardiologist (physician) and Advanced Practice Providers (APPs -  Physician Assistants and Nurse Practitioners) who all work together to provide you with the care you need, when you need it.  We recommend signing up for the patient portal called "MyChart".  Sign up information is provided on this After Visit Summary.  MyChart is used to connect with patients for Virtual Visits (Telemedicine).  Patients are able to view lab/test results, encounter notes, upcoming appointments, etc.  Non-urgent messages can be sent to your provider as well.   To learn more about what you can do with MyChart, go to [email or web address].    Your next appointment:   6 month(s)  The format for your next appointment:   In Person  Provider:   Virl Axe, MD

## 2020-06-29 ENCOUNTER — Telehealth: Payer: Self-pay

## 2020-06-29 ENCOUNTER — Other Ambulatory Visit: Payer: Self-pay | Admitting: Family Medicine

## 2020-06-29 NOTE — Chronic Care Management (AMB) (Addendum)
Jardiance patient assistance application has been filled out for Luke [name] to coordinate patient and provider signatures.  Follow-Up:  Patient Assistance Coordination and Pharmacist Review  [name], CPP notified  [name], [place] Pharmacy Assistant [phone]

## 2020-06-29 NOTE — Telephone Encounter (Signed)
Patient called back to discuss Jardiance. He is open to starting. Will have PCP send in a prescription for Jardiance 10 mg daily to his pharmacy. Reviewed importance of hydration, monitoring for low blood pressure, hypoglycemia and yeast infection. Instructed him to decrease his glipizide 5 mg to 1 tablet BID and stop altogether if any hypoglycemia.   Will also have Jardiance patient assistance application at front desk for patient to complete when he comes to pick up his Lantus shipment.  [name], PharmD Clinical Pharmacist [place] [facility] at [facility] [phone]

## 2020-06-29 NOTE — Telephone Encounter (Signed)
Pharmacy requests refill on: Sildenafil 20 mg   LAST REFILL: 03/12/2019 (Q-50, R-11) LAST OV: 03/14/2020 NEXT OV: 09/14/2020 PHARMACY: [place]

## 2020-06-29 NOTE — Telephone Encounter (Signed)
Contacted patient per discussion with PCP and Dr. Caryl Comes to recommend starting Jardiance. Patient was unavailable but will call back later today.  [name], PharmD Clinical Pharmacist [place] [facility] at [facility] [phone]

## 2020-06-30 MED ORDER — GLIPIZIDE 5 MG PO TABS: 5.0000 mg | ORAL_TABLET | Freq: Two times a day (BID) | ORAL | Status: DC

## 2020-06-30 MED ORDER — EMPAGLIFLOZIN 10 MG PO TABS: 10.0000 mg | ORAL_TABLET | Freq: Every day | ORAL | 1 refills | Status: DC

## 2020-06-30 NOTE — Telephone Encounter (Signed)
Sent. Thanks.   

## 2020-06-30 NOTE — Telephone Encounter (Signed)
Jardiance application at front desk for patient to complete when he picks up his Lantus shipment. Sent to [place] for [employer].   [name], PharmD Clinical Pharmacist [place] [facility] at [facility] [phone]

## 2020-06-30 NOTE — Addendum Note (Signed)
Addended by: Tonia Ghent on: 06/30/2020 06:56 AM   Modules accepted: Orders

## 2020-06-30 NOTE — Telephone Encounter (Signed)
Lantus was shipped to [facility] by [employer]. Do we know if this has arrived?  [name], PharmD Clinical Pharmacist [place] [facility] at [facility] [phone]

## 2020-06-30 NOTE — Telephone Encounter (Signed)
jardiance rx sent and glipizide rx updated in the EMR.  Thanks.

## 2020-06-30 NOTE — Telephone Encounter (Signed)
There are no medications here yet for this patient

## 2020-07-04 NOTE — Telephone Encounter (Signed)
Patient came in and picked up Lantus. Patient also picked up Jardiance application and will drop back off.

## 2020-07-14 ENCOUNTER — Other Ambulatory Visit: Payer: Self-pay | Admitting: Family Medicine

## 2020-07-17 ENCOUNTER — Other Ambulatory Visit: Payer: Self-pay | Admitting: Gastroenterology

## 2020-07-25 ENCOUNTER — Ambulatory Visit (INDEPENDENT_AMBULATORY_CARE_PROVIDER_SITE_OTHER): Payer: Medicare Other

## 2020-07-25 DIAGNOSIS — I255 Ischemic cardiomyopathy: Secondary | ICD-10-CM | POA: Diagnosis not present

## 2020-07-25 LAB — CUP PACEART REMOTE DEVICE CHECK
Battery Remaining Longevity: 3 mo
Battery Remaining Percentage: 3 %
Battery Voltage: 2.62 V
Brady Statistic AP VP Percent: 9.9 %
Brady Statistic AP VS Percent: 1 %
Brady Statistic AS VP Percent: 90 %
Brady Statistic AS VS Percent: 1 %
Brady Statistic RA Percent Paced: 9.7 %
Date Time Interrogation Session: 20220322020016
HighPow Impedance: 77 Ohm
HighPow Impedance: 77 Ohm
Implantable Lead Implant Date: 20140313
Implantable Lead Implant Date: 20140313
Implantable Lead Implant Date: 20140313
Implantable Lead Location: 753858
Implantable Lead Location: 753859
Implantable Lead Location: 753860
Implantable Pulse Generator Implant Date: 20140313
Lead Channel Impedance Value: 390 Ohm
Lead Channel Impedance Value: 410 Ohm
Lead Channel Impedance Value: 850 Ohm
Lead Channel Pacing Threshold Amplitude: 0.5 V
Lead Channel Pacing Threshold Amplitude: 0.875 V
Lead Channel Pacing Threshold Amplitude: 1 V
Lead Channel Pacing Threshold Pulse Width: 0.5 ms
Lead Channel Pacing Threshold Pulse Width: 0.5 ms
Lead Channel Pacing Threshold Pulse Width: 0.6 ms
Lead Channel Sensing Intrinsic Amplitude: 11.9 mV
Lead Channel Sensing Intrinsic Amplitude: 4.2 mV
Lead Channel Setting Pacing Amplitude: 2 V
Lead Channel Setting Pacing Amplitude: 2 V
Lead Channel Setting Pacing Amplitude: 2.5 V
Lead Channel Setting Pacing Pulse Width: 0.5 ms
Lead Channel Setting Pacing Pulse Width: 0.6 ms
Lead Channel Setting Sensing Sensitivity: 0.5 mV
Pulse Gen Serial Number: 7097007

## 2020-08-02 ENCOUNTER — Telehealth: Payer: Self-pay

## 2020-08-02 NOTE — Progress Notes (Signed)
Remote ICD transmission.   

## 2020-08-02 NOTE — Chronic Care Management (AMB) (Addendum)
    Chronic Care Management Pharmacy Assistant   Name: Luke [name]  MRN: [id number] DOB: 03-17-1946   Reason for Encounter: Patient Assistance Update- Jardiance    Medications: Outpatient Encounter Medications as of 08/02/2020  Medication Sig   amLODipine (NORVASC) 10 MG tablet TAKE 1 TABLET(10 MG) BY MOUTH DAILY   aspirin EC 81 MG tablet Take 81 mg by mouth daily.   atorvastatin (LIPITOR) 20 MG tablet Take 1 tablet (20 mg total) by mouth daily.   carvedilol (COREG) 25 MG tablet TAKE 1 TABLET BY MOUTH TWICE DAILY WITH MEALS   Cholecalciferol (VITAMIN D PO) Take 2,500 Units by mouth daily. 1 BID   Cyanocobalamin 2500 MCG CHEW Chew 1 tablet by mouth daily.   empagliflozin (JARDIANCE) 10 MG TABS tablet Take 1 tablet (10 mg total) by mouth daily before breakfast.   FIBER ADULT GUMMIES PO Take by mouth. 2 at bedtime   glipiZIDE (GLUCOTROL) 5 MG tablet Take 1 tablet (5 mg total) by mouth 2 (two) times daily before a meal.   glucose blood (ACCU-CHEK AVIVA PLUS) test strip USE TO CHECK BLOOD GLUCOSE UP TO 3 TIMES DAILY.   hydrALAZINE (APRESOLINE) 25 MG tablet TAKE 1 TABLET(25 MG) BY MOUTH THREE TIMES DAILY   insulin glargine (LANTUS SOLOSTAR) 100 UNIT/ML Solostar Pen INJECT 0.3 TO 0.35 MLS(30 TO 35 UNITS) INTO THE SKIN EVERY DAY   metFORMIN (GLUCOPHAGE) 500 MG tablet TAKE 2 TABLETS BY MOUTH TWICE DAILY WITH FOOD   Multiple Vitamin (MULTIVITAMIN) tablet Take 1 tablet by mouth daily.   omeprazole (PRILOSEC) 40 MG capsule TAKE 1 CAPSULE(40 MG) BY MOUTH TWICE DAILY   sildenafil (REVATIO) 20 MG tablet TAKE 3 TO 4 TABLETS BY MOUTH DAILY AS NEEDED   No facility-administered encounter medications on file as of 08/02/2020.   Contacted [facility] cares  to follow up on patient assistance application for Jardiance. Per representative at [facility] cares states they have not received any portion of the application.    Follow-Up:  Patient Assistance Coordination and Pharmacist Review  [name], CPP  notified  [name], [place] Pharmacy Assistant [phone]

## 2020-08-29 ENCOUNTER — Other Ambulatory Visit: Payer: Self-pay | Admitting: Family Medicine

## 2020-08-30 ENCOUNTER — Ambulatory Visit (INDEPENDENT_AMBULATORY_CARE_PROVIDER_SITE_OTHER): Payer: Medicare Other

## 2020-08-30 DIAGNOSIS — I255 Ischemic cardiomyopathy: Secondary | ICD-10-CM

## 2020-08-30 DIAGNOSIS — I5022 Chronic systolic (congestive) heart failure: Secondary | ICD-10-CM

## 2020-08-30 LAB — CUP PACEART REMOTE DEVICE CHECK
Battery Remaining Longevity: 1 mo
Battery Remaining Percentage: 3 %
Battery Voltage: 2.6 V
Brady Statistic AP VP Percent: 14 %
Brady Statistic AP VS Percent: 1 %
Brady Statistic AS VP Percent: 86 %
Brady Statistic AS VS Percent: 1 %
Brady Statistic RA Percent Paced: 14 %
Date Time Interrogation Session: 20220427020017
HighPow Impedance: 86 Ohm
HighPow Impedance: 86 Ohm
Implantable Lead Implant Date: 20140313
Implantable Lead Implant Date: 20140313
Implantable Lead Implant Date: 20140313
Implantable Lead Location: 753858
Implantable Lead Location: 753859
Implantable Lead Location: 753860
Implantable Pulse Generator Implant Date: 20140313
Lead Channel Impedance Value: 430 Ohm
Lead Channel Impedance Value: 540 Ohm
Lead Channel Impedance Value: 960 Ohm
Lead Channel Pacing Threshold Amplitude: 0.5 V
Lead Channel Pacing Threshold Amplitude: 0.75 V
Lead Channel Pacing Threshold Amplitude: 1 V
Lead Channel Pacing Threshold Pulse Width: 0.5 ms
Lead Channel Pacing Threshold Pulse Width: 0.5 ms
Lead Channel Pacing Threshold Pulse Width: 0.6 ms
Lead Channel Sensing Intrinsic Amplitude: 11.9 mV
Lead Channel Sensing Intrinsic Amplitude: 4 mV
Lead Channel Setting Pacing Amplitude: 2 V
Lead Channel Setting Pacing Amplitude: 2 V
Lead Channel Setting Pacing Amplitude: 2.5 V
Lead Channel Setting Pacing Pulse Width: 0.5 ms
Lead Channel Setting Pacing Pulse Width: 0.6 ms
Lead Channel Setting Sensing Sensitivity: 0.5 mV
Pulse Gen Serial Number: 7097007

## 2020-09-04 ENCOUNTER — Other Ambulatory Visit: Payer: Self-pay | Admitting: Family Medicine

## 2020-09-05 LAB — CUP PACEART REMOTE DEVICE CHECK
Battery Remaining Longevity: 1 mo
Battery Remaining Percentage: 3 %
Battery Voltage: 2.6 V
Brady Statistic AP VP Percent: 14 %
Brady Statistic AP VS Percent: 1 %
Brady Statistic AS VP Percent: 86 %
Brady Statistic AS VS Percent: 1 %
Brady Statistic RA Percent Paced: 14 %
Date Time Interrogation Session: 20220428100238
HighPow Impedance: 83 Ohm
HighPow Impedance: 83 Ohm
Implantable Lead Implant Date: 20140313
Implantable Lead Implant Date: 20140313
Implantable Lead Implant Date: 20140313
Implantable Lead Location: 753858
Implantable Lead Location: 753859
Implantable Lead Location: 753860
Implantable Pulse Generator Implant Date: 20140313
Lead Channel Impedance Value: 430 Ohm
Lead Channel Impedance Value: 490 Ohm
Lead Channel Impedance Value: 890 Ohm
Lead Channel Pacing Threshold Amplitude: 0.5 V
Lead Channel Pacing Threshold Amplitude: 0.875 V
Lead Channel Pacing Threshold Amplitude: 1 V
Lead Channel Pacing Threshold Pulse Width: 0.5 ms
Lead Channel Pacing Threshold Pulse Width: 0.5 ms
Lead Channel Pacing Threshold Pulse Width: 0.6 ms
Lead Channel Sensing Intrinsic Amplitude: 11.9 mV
Lead Channel Sensing Intrinsic Amplitude: 4.8 mV
Lead Channel Setting Pacing Amplitude: 2 V
Lead Channel Setting Pacing Amplitude: 2 V
Lead Channel Setting Pacing Amplitude: 2.5 V
Lead Channel Setting Pacing Pulse Width: 0.5 ms
Lead Channel Setting Pacing Pulse Width: 0.6 ms
Lead Channel Setting Sensing Sensitivity: 0.5 mV
Pulse Gen Serial Number: 7097007

## 2020-09-07 ENCOUNTER — Other Ambulatory Visit: Payer: Self-pay

## 2020-09-07 ENCOUNTER — Telehealth: Payer: Self-pay

## 2020-09-07 ENCOUNTER — Other Ambulatory Visit (INDEPENDENT_AMBULATORY_CARE_PROVIDER_SITE_OTHER): Payer: Medicare Other

## 2020-09-07 DIAGNOSIS — Z125 Encounter for screening for malignant neoplasm of prostate: Secondary | ICD-10-CM | POA: Diagnosis not present

## 2020-09-07 DIAGNOSIS — E1159 Type 2 diabetes mellitus with other circulatory complications: Secondary | ICD-10-CM

## 2020-09-07 LAB — HEMOGLOBIN A1C: Hgb A1c MFr Bld: 7.2 % — ABNORMAL HIGH (ref 4.6–6.5)

## 2020-09-07 LAB — PSA, MEDICARE: PSA: 2.18 ng/ml (ref 0.10–4.00)

## 2020-09-07 NOTE — Telephone Encounter (Signed)
Reviewed patient assistance application for Jardiance 10 mg daily. Patient portion complete. Placed in Dr. Josefine Class box for signature.  [name], PharmD Clinical Pharmacist [place] [facility] at [facility] [phone]

## 2020-09-14 ENCOUNTER — Ambulatory Visit (INDEPENDENT_AMBULATORY_CARE_PROVIDER_SITE_OTHER): Payer: Medicare Other | Admitting: Family Medicine

## 2020-09-14 ENCOUNTER — Encounter: Payer: Self-pay | Admitting: Family Medicine

## 2020-09-14 ENCOUNTER — Other Ambulatory Visit: Payer: Self-pay

## 2020-09-14 DIAGNOSIS — E1159 Type 2 diabetes mellitus with other circulatory complications: Secondary | ICD-10-CM

## 2020-09-14 DIAGNOSIS — R9389 Abnormal findings on diagnostic imaging of other specified body structures: Secondary | ICD-10-CM | POA: Diagnosis not present

## 2020-09-14 DIAGNOSIS — I255 Ischemic cardiomyopathy: Secondary | ICD-10-CM

## 2020-09-14 DIAGNOSIS — E785 Hyperlipidemia, unspecified: Secondary | ICD-10-CM

## 2020-09-14 NOTE — Progress Notes (Signed)
This visit occurred during the SARS-CoV-2 public health emergency.  Safety protocols were in place, including screening questions prior to the visit, additional usage of staff PPE, and extensive cleaning of exam room while observing appropriate contact time as indicated for disinfecting solutions.  Diabetes:  Using medications without difficulties: yes Hypoglycemic episodes:no Hyperglycemic episodes:no Feet problems: tingling after exercise.  Blood Sugars averaging: 99 this AM, that is typical for patient.   eye exam within last year: due- Dr. Idolina Primer in [place] A1c d/w pt.    PSA wnl d/w pt.    He is still working out on bike and treadmill.  Prev CXR d/w pt.  Not SOB.  His pacing is the same, 64min at 3 MPH, ie 1.5 miles.  He does that twice a day.  No cough, no sputum.  No wheeze.  He has some cramps, currently on atorvastatin.  D/w pt about drinking enough water.  No claudication.  Former smoker.  Prev imaging with IMPRESSION: 1. The appearance the chest suggests a combination of emphysema and interstitial lung disease. These findings could be definitively evaluated with follow-up nonemergent high-resolution chest CT. The possibility of acute infection in the left mid lung is not entirely excluded. Clinical correlation for signs and symptoms of pneumonia is suggested. 2. Aortic atherosclerosis.  Meds, vitals, and allergies reviewed.  ROS: Per HPI unless specifically indicated in ROS section   GEN: nad, alert and oriented HEENT: ncat NECK: supple w/o LA CV: rrr. PULM: ctab, no inc wob ABD: soft, +bs EXT: no edema SKIN: no acute rash  Diabetic foot exam: Normal inspection No skin breakdown Right first distal metatarsal callus without ulcer noted Decreased dorsalis pedis pulses bilaterally but he has palpable posterior tibial pulses bilaterally. He has altered but not absent sensation in the bilateral feet at baseline. Nails on the right foot and thickened.  31 minutes were  devoted to patient care in this encounter (this includes time spent reviewing the patient's file/history, interviewing and examining the patient, counseling/reviewing plan with patient).

## 2020-09-14 NOTE — Patient Instructions (Addendum)
Don't change your meds for now.  Let me check on options for the CT chest.  Plan on recheck in about 6 months at a yearly visit with labs ahead of time.   If you have more cramps then cut the atorvastatin in half for about 2 weeks and let me know.  Take care.  Glad to see you.

## 2020-09-15 ENCOUNTER — Encounter (HOSPITAL_COMMUNITY): Payer: Self-pay

## 2020-09-15 ENCOUNTER — Emergency Department (HOSPITAL_COMMUNITY)
Admission: EM | Admit: 2020-09-15 | Discharge: 2020-09-16 | Disposition: A | Discharge status: Home / Self Care | Payer: Medicare Other | Attending: Emergency Medicine | Admitting: Emergency Medicine

## 2020-09-15 ENCOUNTER — Other Ambulatory Visit: Payer: Self-pay

## 2020-09-15 DIAGNOSIS — Z87891 Personal history of nicotine dependence: Secondary | ICD-10-CM | POA: Insufficient documentation

## 2020-09-15 DIAGNOSIS — E119 Type 2 diabetes mellitus without complications: Secondary | ICD-10-CM | POA: Insufficient documentation

## 2020-09-15 DIAGNOSIS — Z79899 Other long term (current) drug therapy: Secondary | ICD-10-CM | POA: Insufficient documentation

## 2020-09-15 DIAGNOSIS — T82118A Breakdown (mechanical) of other cardiac electronic device, initial encounter: Secondary | ICD-10-CM | POA: Diagnosis not present

## 2020-09-15 DIAGNOSIS — I2510 Atherosclerotic heart disease of native coronary artery without angina pectoris: Secondary | ICD-10-CM | POA: Insufficient documentation

## 2020-09-15 DIAGNOSIS — I10 Essential (primary) hypertension: Secondary | ICD-10-CM | POA: Diagnosis not present

## 2020-09-15 DIAGNOSIS — Z4502 Encounter for adjustment and management of automatic implantable cardiac defibrillator: Secondary | ICD-10-CM | POA: Diagnosis not present

## 2020-09-15 DIAGNOSIS — T82897A Other specified complication of cardiac prosthetic devices, implants and grafts, initial encounter: Secondary | ICD-10-CM | POA: Diagnosis not present

## 2020-09-15 DIAGNOSIS — Z7982 Long term (current) use of aspirin: Secondary | ICD-10-CM | POA: Insufficient documentation

## 2020-09-15 DIAGNOSIS — Z794 Long term (current) use of insulin: Secondary | ICD-10-CM | POA: Insufficient documentation

## 2020-09-15 DIAGNOSIS — Z950 Presence of cardiac pacemaker: Secondary | ICD-10-CM | POA: Diagnosis not present

## 2020-09-15 DIAGNOSIS — Z7984 Long term (current) use of oral hypoglycemic drugs: Secondary | ICD-10-CM | POA: Diagnosis not present

## 2020-09-15 DIAGNOSIS — I5022 Chronic systolic (congestive) heart failure: Secondary | ICD-10-CM | POA: Diagnosis not present

## 2020-09-15 DIAGNOSIS — Y713 Surgical instruments, materials and cardiovascular devices (including sutures) associated with adverse incidents: Secondary | ICD-10-CM | POA: Diagnosis not present

## 2020-09-15 DIAGNOSIS — I110 Hypertensive heart disease with heart failure: Secondary | ICD-10-CM | POA: Insufficient documentation

## 2020-09-15 LAB — CBC WITH DIFFERENTIAL/PLATELET
Abs Immature Granulocytes: 0.06 10*3/uL (ref 0.00–0.07)
Basophils Absolute: 0.1 10*3/uL (ref 0.0–0.1)
Basophils Relative: 1 %
Eosinophils Absolute: 0.3 10*3/uL (ref 0.0–0.5)
Eosinophils Relative: 3 %
HCT: 47.6 % (ref 39.0–52.0)
Hemoglobin: 16.2 g/dL (ref 13.0–17.0)
Immature Granulocytes: 1 %
Lymphocytes Relative: 22 %
Lymphs Abs: 2.2 10*3/uL (ref 0.7–4.0)
MCH: 33.1 pg (ref 26.0–34.0)
MCHC: 34 g/dL (ref 30.0–36.0)
MCV: 97.3 fL (ref 80.0–100.0)
Monocytes Absolute: 0.9 10*3/uL (ref 0.1–1.0)
Monocytes Relative: 9 %
Neutro Abs: 6.7 10*3/uL (ref 1.7–7.7)
Neutrophils Relative %: 64 %
Platelets: 263 10*3/uL (ref 150–400)
RBC: 4.89 MIL/uL (ref 4.22–5.81)
RDW: 12.3 % (ref 11.5–15.5)
WBC: 10.2 10*3/uL (ref 4.0–10.5)
nRBC: 0 % (ref 0.0–0.2)

## 2020-09-15 LAB — BASIC METABOLIC PANEL
Anion gap: 9 (ref 5–15)
BUN: 23 mg/dL (ref 8–23)
CO2: 24 mmol/L (ref 22–32)
Calcium: 10 mg/dL (ref 8.9–10.3)
Chloride: 104 mmol/L (ref 98–111)
Creatinine, Ser: 1.23 mg/dL (ref 0.61–1.24)
GFR, Estimated: >60 mL/min (ref >60)
Glucose, Bld: 126 mg/dL — ABNORMAL HIGH (ref 70–99)
Potassium: 4.2 mmol/L (ref 3.5–5.1)
Sodium: 137 mmol/L (ref 135–145)

## 2020-09-15 LAB — TROPONIN I (HIGH SENSITIVITY): Troponin I (High Sensitivity): 12 ng/L (ref <18)

## 2020-09-15 NOTE — Progress Notes (Signed)
Remote ICD transmission.   

## 2020-09-15 NOTE — ED Notes (Signed)
Pacemaker interrogated using St. Software engineer.

## 2020-09-15 NOTE — Discharge Instructions (Signed)
Please follow with Dr. Caryl Comes.

## 2020-09-15 NOTE — ED Notes (Signed)
[employer] called to say that the transmission went through and that the device vibrated to let him know he has 3 months for it to be changed. He is being followed by MD Cleda Mccreedy and he has been sent the transmission.

## 2020-09-15 NOTE — Addendum Note (Signed)
Addended by: Douglass Rivers D on: 09/15/2020 04:35 PM   Modules accepted: Level of Service

## 2020-09-15 NOTE — ED Triage Notes (Signed)
Pt bib EMS and states he was sitting in a chair and felt a buzzing in his chest and realized it was his pacemaker/defib. Pt reports that his device card states if it buzzes x2 to call 911. Pt does not think it was a true shock, just a vibration. Pt denies any CP or other problems. Pt reports he needs his device changed in about 3 months. (St. Jude's device implanted)

## 2020-09-15 NOTE — ED Provider Notes (Addendum)
[facility] EMERGENCY DEPARTMENT Provider Note   CSN: [id number] Arrival date & time: 09/15/20  2217     History Chief Complaint  Patient presents with  . AICD Problem    Luke [name] is a 75 y.o. male.  Past medical history of complete heart block with ICD in place, CAD, type 2 diabetes, presenting for evaluation of his ICD.  States this evening around 10 PM he felt his ICD vibrate twice.  This lasted just a few seconds in duration each.  He did not feel any shocks.  He is not having any symptoms including no chest pain, shortness of breath, lightheadedness, fatigue.  He feels at his baseline.  He states he is followed by Dr. Marlou Porch and Dr. Caryl Comes with cardiology.  Recently had his ICD evaluated and knows he needs the battery replaced in about 3 months, sometime around August.  He wanted to be sure there was nothing else emergent happening because he has never felt a vibration before.  The history is provided by the patient.       Past Medical History:  Diagnosis Date  . AAA (abdominal aortic aneurysm) ([place]) 2011   Per vascular surgery  . Anxiety    no med. in use for anxiety but pt. speaks openly of his stress & anxious feelings regarding impending surgery    . Atrial fibrillation ([place])   . Automatic implantable cardioverter-defibrillator in situ   . CAD (coronary artery disease)    Presumed CAD with nuclear scan October 09, 2011,  large anteroseptal MI and inferior MI. Catheterization scheduled October 15, 2011  . Cardiomyopathy [facility])    Nuclear, October 09, 2011, EF 30%, multiple focal wall motion abnormalities  . Diabetes mellitus    type II  . Drug therapy    Hyperkalemia with spironolactone  . Ejection fraction < 50%    EF 30%, nuclear, October 09, 2011  . Ejection fraction < 50%    EF previously 30%  //   EF 30-35%, echo, July 14, 2012, severe diffuse hypokinesis, PA pressure 43 mm mercury  . Fall due to ice or snow Feb.  19, 2015  . HLD (hyperlipidemia)   .  Hypertension    white coat HTN-- often elevated in office and controlled on outside checks.  . ICD (implantable cardioverter-defibrillator) in place    CRT-D placed March, 2014 complete heart block and k dysfunction  . LBBB (left bundle branch block)    LBBB on EKG October 11, 2011,  no prior EKG has been done  . Low testosterone    Hx of  . Myocardial infarction ([place])    Told in 2015-never knew-showed up on stress test for AAA;   . Pacemaker   . PAD (peripheral artery disease) ([place])   . Pancreatitis     Patient Active Problem List   Diagnosis Date Noted  . Pain due to onychomycosis of toenails of both feet 08/10/2019  . Chronic systolic heart failure ([place]) 06/08/2019  . Biventricular ICD (implantable cardioverter-defibrillator) in place 06/08/2019  . Esophageal obstruction due to food impaction   . Esophageal stricture   . Dysphagia 11/19/2017  . CKD (chronic kidney disease), stage III ([place]) 11/10/2017  . Pancreatitis 11/10/2017  . Healthcare maintenance 11/22/2016  . Tinnitus 05/23/2015  . Medicare annual wellness visit, subsequent 10/20/2014  . Advance care planning 10/20/2014  . S/P cervical spinal fusion 06/09/2014  . Stenosis of cervical spine with myelopathy ([place]) 12/27/2013  . Preop cardiovascular exam 08/13/2013  .  Central cord syndrome ([place]) 06/24/2013  . Encounter for fitting or adjustment of automatic implantable cardioverter-defibrillator 04/20/2013  . Drug therapy   . Status post abdominal aortic aneurysm repair 12/30/2012  . HLD (hyperlipidemia) 09/16/2012  . Ejection fraction < 50%   . Complete heart block ([place]) 07/14/2012  . SK (seborrheic keratosis) 02/12/2012  . Shoulder pain 11/08/2011  . CAD (coronary artery disease)   . Ischemic cardiomyopathy   . LBBB (left bundle branch block)   . PVD (peripheral vascular disease) ([place]) 10/08/2010  . ORGANIC IMPOTENCE 07/05/2010  . Essential hypertension, benign 02/27/2010  . Type 2 diabetes mellitus with vascular  disease ([place]) 11/16/2009  . AAA (abdominal aortic aneurysm) ([place]) 05/06/2009    Past Surgical History:  Procedure Laterality Date  . ABDOMINAL AORTAGRAM N/A 10/28/2011   Procedure: ABDOMINAL [name];  Surgeon: Angelia Mould, MD;  Location: [facility] CATH LAB;  Service: Cardiovascular;  Laterality: N/A;  . ABDOMINAL AORTIC ANEURYSM REPAIR     EVAR   . BI-VENTRICULAR IMPLANTABLE CARDIOVERTER DEFIBRILLATOR N/A 07/16/2012   Procedure: BI-VENTRICULAR IMPLANTABLE CARDIOVERTER DEFIBRILLATOR  (CRT-D);  Surgeon: Deboraha Sprang, MD;  Location: [facility] CATH LAB;  Service: Cardiovascular;  Laterality: N/A;  . CARDIAC CATHETERIZATION    . COLONOSCOPY  03/23/2018; 2020  . ESOPHAGOGASTRODUODENOSCOPY N/A 02/12/2019   Procedure: ESOPHAGOGASTRODUODENOSCOPY (EGD);  Surgeon: Jackquline Denmark, MD;  Location: [facility] ENDOSCOPY;  Service: Endoscopy;  Laterality: N/A;  . ESOPHAGOGASTRODUODENOSCOPY (EGD) WITH PROPOFOL N/A 02/05/2018   Procedure: ESOPHAGOGASTRODUODENOSCOPY (EGD) WITH PROPOFOL;  Surgeon: Mauri Pole, MD;  Location: [facility] ENDOSCOPY;  Service: Endoscopy;  Laterality: N/A;  . ESOPHAGOGASTRODUODENOSCOPY (EGD) WITH PROPOFOL N/A 09/27/2018   Procedure: ESOPHAGOGASTRODUODENOSCOPY (EGD) WITH PROPOFOL;  Surgeon: Carol Ada, MD;  Location: [place];  Service: Endoscopy;  Laterality: N/A;  . FOREIGN BODY REMOVAL  09/27/2018   Procedure: FOREIGN BODY REMOVAL;  Surgeon: Carol Ada, MD;  Location: [facility] ENDOSCOPY;  Service: Endoscopy;;  . FOREIGN BODY REMOVAL  02/12/2019   Procedure: FOREIGN BODY REMOVAL;  Surgeon: Jackquline Denmark, MD;  Location: [facility] ENDOSCOPY;  Service: Endoscopy;;  . HERNIA REPAIR  Jan. 9, 2015  . INSERTION OF MESH N/A 05/14/2013   Procedure: INSERTION OF MESH;  Surgeon: Adin Hector, MD;  Location: [place];  Service: General;  Laterality: N/A;  . LOWER EXTREMITY ANGIOGRAM Bilateral 10/28/2011   Procedure: LOWER EXTREMITY ANGIOGRAM;  Surgeon: Angelia Mould, MD;  Location: [facility] CATH LAB;  Service:  Cardiovascular;  Laterality: Bilateral;  . PACEMAKER INSERTION  07-16-12   pacemaker/defibrilator  . POLYPECTOMY    . POSTERIOR CERVICAL FUSION/FORAMINOTOMY N/A 06/09/2014   Procedure: Laminectomy - Cervical two-Cervcial four posterior cervical instrumented fusion Cervical two-cervical four;  Surgeon: Eustace Moore, MD;  Location: [place] NEURO ORS;  Service: Neurosurgery;  Laterality: N/A;  posterior   . SAVORY DILATION N/A 02/05/2018   Procedure: SAVORY DILATION;  Surgeon: Mauri Pole, MD;  Location: [facility] ENDOSCOPY;  Service: Endoscopy;  Laterality: N/A;  . TONSILLECTOMY     as a child   . UMBILICAL HERNIA REPAIR N/A 05/14/2013   Procedure: LAPAROSCOPIC exploration and repair of hernia in abdominal ;  Surgeon: Adin Hector, MD;  Location: [place];  Service: General;  Laterality: N/A;  . UPPER GASTROINTESTINAL ENDOSCOPY  2020       Family History  Problem Relation Age of Onset  . Hypertension Mother   . Stroke Mother   . Hyperlipidemia Mother   . Diabetes Sister   . Heart disease Sister  Before age 62  . Hypertension Sister   . Hyperlipidemia Sister   . Heart attack Sister   . Lung cancer Father   . Hypertension Son   . Colon cancer Neg Hx   . Prostate cancer Neg Hx   . Esophageal cancer Neg Hx   . Stomach cancer Neg Hx   . Rectal cancer Neg Hx   . Colon polyps Neg Hx     Social History   Tobacco Use  . Smoking status: Former Smoker    Packs/day: 2.00    Years: 40.00    Pack years: 80.00    Types: Cigarettes    Quit date: 05/06/2004    Years since quitting: 16.3  . Smokeless tobacco: Never Used  Vaping Use  . Vaping Use: Never used  Substance Use Topics  . Alcohol use: Yes    Alcohol/week: 0.0 - 2.0 standard drinks    Comment: beer or wine  occassionally  . Drug use: No    Home Medications Prior to Admission medications   Medication Sig Start Date End Date Taking? Authorizing Provider  amLODipine (NORVASC) 10 MG tablet TAKE 1 TABLET(10 MG) BY MOUTH DAILY  12/09/19   Jerline Pain, MD  aspirin EC 81 MG tablet Take 81 mg by mouth daily.    [provider]  atorvastatin (LIPITOR) 20 MG tablet Take 1 tablet (20 mg total) by mouth daily. 03/14/20   Tonia Ghent, MD  carvedilol (COREG) 25 MG tablet TAKE 1 TABLET BY MOUTH TWICE DAILY WITH MEALS 03/15/20   Jerline Pain, MD  Cholecalciferol (VITAMIN D PO) Take 2,500 Units by mouth daily. 1 BID    [provider]  Cyanocobalamin 2500 MCG CHEW Chew 1 tablet by mouth daily.    [provider]  empagliflozin (JARDIANCE) 10 MG TABS tablet Take 1 tablet (10 mg total) by mouth daily before breakfast. 06/30/20   Tonia Ghent, MD  FIBER ADULT GUMMIES PO Take by mouth. 2 at bedtime    [provider]  glipiZIDE (GLUCOTROL) 5 MG tablet Take 1 tablet (5 mg total) by mouth 2 (two) times daily before a meal. 07/14/20   Tonia Ghent, MD  glucose blood (ACCU-CHEK AVIVA PLUS) test strip USE TO CHECK BLOOD GLUCOSE UP TO 3 TIMES DAILY. 04/27/20   Tonia Ghent, MD  hydrALAZINE (APRESOLINE) 25 MG tablet TAKE 1 TABLET(25 MG) BY MOUTH THREE TIMES DAILY 08/29/20   Tonia Ghent, MD  insulin glargine (LANTUS SOLOSTAR) 100 UNIT/ML Solostar Pen INJECT 0.3 TO 0.35 MLS(30 TO 35 UNITS) INTO THE SKIN EVERY DAY 03/29/20   Tonia Ghent, MD  metFORMIN (GLUCOPHAGE) 500 MG tablet TAKE 2 TABLETS BY MOUTH TWICE DAILY WITH FOOD 09/05/20   Tonia Ghent, MD  Multiple Vitamin (MULTIVITAMIN) tablet Take 1 tablet by mouth daily.    [provider]  omeprazole (PRILOSEC) 40 MG capsule TAKE 1 CAPSULE(40 MG) BY MOUTH TWICE DAILY 07/17/20   Mauri Pole, MD  sildenafil (REVATIO) 20 MG tablet TAKE 3 TO 4 TABLETS BY MOUTH DAILY AS NEEDED 06/30/20   Tonia Ghent, MD    Allergies    Aldactone [spironolactone], Codeine, Januvia [sitagliptin], and Lisinopril  Review of Systems   Review of Systems  All other systems reviewed and are negative.   Physical Exam Updated Vital  Signs BP (!) 156/73   Pulse 75   Temp 98.1 F (36.7 C) (Oral)   Resp 17   Ht 6\' 1"  (1.854  m)   Wt 86.6 kg   SpO2 92%   BMI 25.20 kg/m   Physical Exam Vitals and nursing note reviewed.  Constitutional:      General: He is not in acute distress.    Appearance: He is well-developed.  HENT:     Head: Normocephalic and atraumatic.  Eyes:     Conjunctiva/sclera: Conjunctivae normal.  Cardiovascular:     Rate and Rhythm: Normal rate and regular rhythm.  Pulmonary:     Effort: Pulmonary effort is normal. No respiratory distress.     Breath sounds: Normal breath sounds.  Abdominal:     General: Bowel sounds are normal.     Palpations: Abdomen is soft.     Tenderness: There is no abdominal tenderness.  Musculoskeletal:     Right lower leg: No edema.     Left lower leg: No edema.  Skin:    General: Skin is warm.  Neurological:     Mental Status: He is alert.  Psychiatric:        Behavior: Behavior normal.     ED Results / Procedures / Treatments   Labs (all labs ordered are listed, but only abnormal results are displayed) Labs Reviewed  BASIC METABOLIC PANEL - Abnormal; Notable for the following components:      Result Value   Glucose, Bld 126 (*)    All other components within normal limits  CBC WITH DIFFERENTIAL/PLATELET  TROPONIN I (HIGH SENSITIVITY)    EKG EKG Interpretation  Date/Time:  Friday Sep 15 2020 22:21:38 EDT Ventricular Rate:  91 PR Interval:  161 QRS Duration: 178 QT Interval:  430 QTC Calculation: 530 R Axis:   243 Text Interpretation: Sinus rhythm Right bundle branch block Anterior infarct, old Lateral leads are also involved Reconfirmed by Thamas Jaegers ([id number]) on 09/15/2020 11:52:59 PM   Radiology No results found.  Procedures Procedures   Medications Ordered in ED Medications - No data to display  ED Course  I have reviewed the triage vital signs and the nursing notes.  Pertinent labs & imaging results that were available during my  care of the patient were reviewed by me and considered in my medical decision making (see chart for details).    MDM Rules/Calculators/A&P                          Patient presenting for evaluation of vibrating ICD.  He provides a pocket card that he was given by cardiology for guidance/interpretation of his ICD.  He states despite this he was worried that something was wrong because he has never felt it vibrate before.  [employer] ICD was interrogated and reports the vibration was indicating that he needs his battery replaced in 3 months.  There is no other abnormality noted on the interrogation, it is functioning properly per [employer] representative that called RN. Results were forwarded to Dr. Caryl Comes.  He remains asymptomatic.  Laboratory work was obtained in triage and is unremarkable.  Discharged with recommendation to follow with Dr. Caryl Comes.  Discussed results, findings, treatment and follow up. Patient advised of return precautions. Patient verbalized understanding and agreed with plan.   Final Clinical Impression(s) / ED Diagnoses Final diagnoses:  Encounter for assessment of automatic implantable cardioverter-defibrillator (AICD)    Rx / DC Orders ED Discharge Orders    None       Mohit Zirbes, [place] N, PA-C 09/15/20 2349    Luna Fuse, MD 09/15/20 2350  Skyley Grandmaison, [place] N, PA-C 09/15/20 2353    Luna Fuse, MD 09/18/20 [phone]

## 2020-09-17 ENCOUNTER — Telehealth: Payer: Self-pay | Admitting: Family Medicine

## 2020-09-17 DIAGNOSIS — R9389 Abnormal findings on diagnostic imaging of other specified body structures: Secondary | ICD-10-CM | POA: Insufficient documentation

## 2020-09-17 NOTE — Assessment & Plan Note (Signed)
Discussed with patient about getting high-resolution chest CT.  We will work on getting that set up.

## 2020-09-17 NOTE — Telephone Encounter (Signed)
Please check with [phone]-[phone].  It would be reasonable to get a high-resolution chest CT set up for this patient to evaluate for fibrotic changes given his history of an abnormal x-ray.  I realize there is a contrast shortage.  Can a high-resolution chest CT be completed without contrast?  Either way, please let me know.  Thanks.

## 2020-09-17 NOTE — Assessment & Plan Note (Signed)
Discussed options, especially about maintaining good hydration If more cramps then cut the atorvastatin in half for about 2 weeks and he will let me know about his status.  He agrees with plan.

## 2020-09-17 NOTE — Assessment & Plan Note (Signed)
Foot care discussed with patient.  A1c improved.  No change in meds at this point.  Continue Jardiance glipizide insulin and metformin.  Plan on recheck later this year.  He agrees to plan.  Continue work on diet and exercise.

## 2020-09-18 ENCOUNTER — Telehealth: Payer: Self-pay

## 2020-09-18 NOTE — Telephone Encounter (Signed)
Spoke with Radiology and the high resolution chest CT is always done without contrast so this can be done.

## 2020-09-18 NOTE — Telephone Encounter (Signed)
noted 

## 2020-09-18 NOTE — Telephone Encounter (Signed)
MOD transmission received, device has reached ERI as of 09/15/20.  Pt is device dependant.    Spoke with pt, he reports he went to ED due to the notifier occurring twice.  He had read on the shock plan to go to ED if it happens 2 more times in a 24 hour period.  Explained to patient that refers to if he is shocked.  He has not been shocked by his device.    Pt v/u that the vibratory al;ert should not happen again, but if it does and he is feeling ok to just call us.    Pt last seen by Dr. Caryl Comes 06/28/20, Gen changeout procedure was discussed per notes.  Advised patient I would send message to Dr. Olin Pia nurse Rosann Auerbach to contact him in regards to scheduling procedure.  Pt indicates he has an appt scheduled for August, stressed to patient that he cannot wait until this appt.

## 2020-09-19 ENCOUNTER — Other Ambulatory Visit: Payer: Self-pay | Admitting: Internal Medicine

## 2020-09-19 ENCOUNTER — Telehealth: Payer: Self-pay

## 2020-09-19 DIAGNOSIS — I255 Ischemic cardiomyopathy: Secondary | ICD-10-CM

## 2020-09-19 DIAGNOSIS — I442 Atrioventricular block, complete: Secondary | ICD-10-CM

## 2020-09-19 DIAGNOSIS — Z01812 Encounter for preprocedural laboratory examination: Secondary | ICD-10-CM

## 2020-09-19 DIAGNOSIS — I5022 Chronic systolic (congestive) heart failure: Secondary | ICD-10-CM

## 2020-09-19 NOTE — Telephone Encounter (Signed)
Spoke with pt re: need to schedule BiV-ICD generator change.  Will plan for 10/25/2020 per pt request.  Pt advised once procedure is scheduled will contact to review instructions.  Pt verbalizes understanding and is agreeable with current plan.

## 2020-09-19 NOTE — Telephone Encounter (Signed)
Ordered.  Please let patient know and he'll get a call about scheduling.  Thanks.

## 2020-09-19 NOTE — Telephone Encounter (Signed)
Spoke with patients wife (okay per DPR) and advised Chest CT has been ordered and will get a call back to schedule. Wife verbalized understanding and will let patient know as well.

## 2020-09-21 NOTE — Telephone Encounter (Signed)
Spoke with pt and reviewed Medical illustrator.  See letter for complete details.  Pt advised copy has been placed on his MyChart.  Hard copies of letter and wash instructions along with surgical scrub has been placed at front desk for pick up.  Pt verbalizes understanding and agrees with current plan.

## 2020-09-28 NOTE — Progress Notes (Addendum)
[place] to check the status of PAP for Jardiance10mg  and per the company on 6/77/03 the application was denied due to income status. They do not release information on income limits.   [name], CPP notified  CMA: 10 min Avel Sensor, [place] Assistant [phone]

## 2020-10-03 ENCOUNTER — Ambulatory Visit
Admission: RE | Admit: 2020-10-03 | Discharge: 2020-10-03 | Disposition: A | Discharge status: Home / Self Care | Payer: Medicare Other | Source: Ambulatory Visit | Attending: Family Medicine | Admitting: Family Medicine

## 2020-10-03 ENCOUNTER — Other Ambulatory Visit: Payer: Self-pay

## 2020-10-03 ENCOUNTER — Ambulatory Visit (INDEPENDENT_AMBULATORY_CARE_PROVIDER_SITE_OTHER): Payer: Medicare Other

## 2020-10-03 DIAGNOSIS — R9389 Abnormal findings on diagnostic imaging of other specified body structures: Secondary | ICD-10-CM

## 2020-10-03 DIAGNOSIS — R918 Other nonspecific abnormal finding of lung field: Secondary | ICD-10-CM | POA: Diagnosis not present

## 2020-10-03 DIAGNOSIS — I442 Atrioventricular block, complete: Secondary | ICD-10-CM

## 2020-10-04 ENCOUNTER — Other Ambulatory Visit: Payer: Self-pay | Admitting: Family Medicine

## 2020-10-04 DIAGNOSIS — R9389 Abnormal findings on diagnostic imaging of other specified body structures: Secondary | ICD-10-CM

## 2020-10-04 LAB — CUP PACEART REMOTE DEVICE CHECK
Battery Remaining Longevity: 0 mo
Battery Voltage: 2.59 V
Brady Statistic AP VP Percent: 15 %
Brady Statistic AP VS Percent: 1 %
Brady Statistic AS VP Percent: 85 %
Brady Statistic AS VS Percent: 1 %
Brady Statistic RA Percent Paced: 15 %
Date Time Interrogation Session: 20220531082132
HighPow Impedance: 79 Ohm
HighPow Impedance: 79 Ohm
Implantable Lead Implant Date: 20140313
Implantable Lead Implant Date: 20140313
Implantable Lead Implant Date: 20140313
Implantable Lead Location: 753858
Implantable Lead Location: 753859
Implantable Lead Location: 753860
Implantable Pulse Generator Implant Date: 20140313
Lead Channel Impedance Value: 430 Ohm
Lead Channel Impedance Value: 440 Ohm
Lead Channel Impedance Value: 800 Ohm
Lead Channel Pacing Threshold Amplitude: 0.5 V
Lead Channel Pacing Threshold Amplitude: 0.75 V
Lead Channel Pacing Threshold Amplitude: 1 V
Lead Channel Pacing Threshold Pulse Width: 0.5 ms
Lead Channel Pacing Threshold Pulse Width: 0.5 ms
Lead Channel Pacing Threshold Pulse Width: 0.6 ms
Lead Channel Sensing Intrinsic Amplitude: 11.9 mV
Lead Channel Sensing Intrinsic Amplitude: 3.4 mV
Lead Channel Setting Pacing Amplitude: 2 V
Lead Channel Setting Pacing Amplitude: 2 V
Lead Channel Setting Pacing Amplitude: 2.5 V
Lead Channel Setting Pacing Pulse Width: 0.5 ms
Lead Channel Setting Pacing Pulse Width: 0.6 ms
Lead Channel Setting Sensing Sensitivity: 0.5 mV
Pulse Gen Serial Number: 7097007

## 2020-10-09 DIAGNOSIS — Z23 Encounter for immunization: Secondary | ICD-10-CM | POA: Diagnosis not present

## 2020-10-12 ENCOUNTER — Other Ambulatory Visit: Payer: Self-pay | Admitting: Family Medicine

## 2020-10-12 ENCOUNTER — Other Ambulatory Visit: Payer: Self-pay | Admitting: Gastroenterology

## 2020-10-23 ENCOUNTER — Other Ambulatory Visit: Payer: Medicare Other

## 2020-10-23 ENCOUNTER — Other Ambulatory Visit: Payer: Self-pay

## 2020-10-23 DIAGNOSIS — I255 Ischemic cardiomyopathy: Secondary | ICD-10-CM | POA: Diagnosis not present

## 2020-10-23 DIAGNOSIS — Z01812 Encounter for preprocedural laboratory examination: Secondary | ICD-10-CM | POA: Diagnosis not present

## 2020-10-23 DIAGNOSIS — I5022 Chronic systolic (congestive) heart failure: Secondary | ICD-10-CM | POA: Diagnosis not present

## 2020-10-23 DIAGNOSIS — I442 Atrioventricular block, complete: Secondary | ICD-10-CM | POA: Diagnosis not present

## 2020-10-23 LAB — CBC
Hematocrit: 45.2 % (ref 37.5–51.0)
Hemoglobin: 15.3 g/dL (ref 13.0–17.7)
MCH: 32.8 pg (ref 26.6–33.0)
MCHC: 33.8 g/dL (ref 31.5–35.7)
MCV: 97 fL (ref 79–97)
Platelets: 265 10*3/uL (ref 150–450)
RBC: 4.67 x10E6/uL (ref 4.14–5.80)
RDW: 13.1 % (ref 11.6–15.4)
WBC: 9.7 10*3/uL (ref 3.4–10.8)

## 2020-10-23 LAB — BASIC METABOLIC PANEL
BUN/Creatinine Ratio: 19 (ref 10–24)
BUN: 22 mg/dL (ref 8–27)
CO2: 27 mmol/L (ref 20–29)
Calcium: 10.3 mg/dL — ABNORMAL HIGH (ref 8.6–10.2)
Chloride: 101 mmol/L (ref 96–106)
Creatinine, Ser: 1.17 mg/dL (ref 0.76–1.27)
Glucose: 149 mg/dL — ABNORMAL HIGH (ref 65–99)
Potassium: 4.8 mmol/L (ref 3.5–5.2)
Sodium: 136 mmol/L (ref 134–144)
eGFR: 65 mL/min/{1.73_m2} (ref >59)

## 2020-10-24 LAB — CUP PACEART REMOTE DEVICE CHECK
Battery Remaining Longevity: 0 mo
Battery Voltage: 2.57 V
Brady Statistic AP VP Percent: 17 %
Brady Statistic AP VS Percent: 1 %
Brady Statistic AS VP Percent: 83 %
Brady Statistic AS VS Percent: 1 %
Brady Statistic RA Percent Paced: 16 %
Date Time Interrogation Session: 20220621113555
HighPow Impedance: 82 Ohm
HighPow Impedance: 82 Ohm
Implantable Lead Implant Date: 20140313
Implantable Lead Implant Date: 20140313
Implantable Lead Implant Date: 20140313
Implantable Lead Location: 753858
Implantable Lead Location: 753859
Implantable Lead Location: 753860
Implantable Pulse Generator Implant Date: 20140313
Lead Channel Impedance Value: 400 Ohm
Lead Channel Impedance Value: 440 Ohm
Lead Channel Impedance Value: 830 Ohm
Lead Channel Pacing Threshold Amplitude: 0.5 V
Lead Channel Pacing Threshold Amplitude: 0.875 V
Lead Channel Pacing Threshold Amplitude: 1 V
Lead Channel Pacing Threshold Pulse Width: 0.5 ms
Lead Channel Pacing Threshold Pulse Width: 0.5 ms
Lead Channel Pacing Threshold Pulse Width: 0.6 ms
Lead Channel Sensing Intrinsic Amplitude: 11.9 mV
Lead Channel Sensing Intrinsic Amplitude: 3.4 mV
Lead Channel Setting Pacing Amplitude: 2 V
Lead Channel Setting Pacing Amplitude: 2 V
Lead Channel Setting Pacing Amplitude: 2.5 V
Lead Channel Setting Pacing Pulse Width: 0.5 ms
Lead Channel Setting Pacing Pulse Width: 0.6 ms
Lead Channel Setting Sensing Sensitivity: 0.5 mV
Pulse Gen Serial Number: 7097007

## 2020-10-24 NOTE — Pre-Procedure Instructions (Signed)
Attempted to call patient regarding procedure instructions.  Left voice mail on the following items: Arrival time 0830 Nothing to eat or drink after midnight No meds AM of procedure Responsible person to drive you home and stay with you for 24 hrs Wash with special soap night before and morning of procedure

## 2020-10-25 ENCOUNTER — Other Ambulatory Visit: Payer: Self-pay

## 2020-10-25 ENCOUNTER — Ambulatory Visit (HOSPITAL_COMMUNITY)
Admission: RE | Admit: 2020-10-25 | Discharge: 2020-10-25 | Disposition: A | Discharge status: Home / Self Care | Payer: Medicare Other | Attending: Internal Medicine | Admitting: Internal Medicine

## 2020-10-25 ENCOUNTER — Encounter (HOSPITAL_COMMUNITY): Payer: Self-pay | Admitting: Internal Medicine

## 2020-10-25 ENCOUNTER — Encounter (HOSPITAL_COMMUNITY)
Admission: RE | Disposition: A | Discharge status: Home / Self Care | Payer: Self-pay | Source: Home / Self Care | Attending: Internal Medicine

## 2020-10-25 DIAGNOSIS — I2510 Atherosclerotic heart disease of native coronary artery without angina pectoris: Secondary | ICD-10-CM | POA: Insufficient documentation

## 2020-10-25 DIAGNOSIS — Z888 Allergy status to other drugs, medicaments and biological substances status: Secondary | ICD-10-CM | POA: Diagnosis not present

## 2020-10-25 DIAGNOSIS — Z833 Family history of diabetes mellitus: Secondary | ICD-10-CM | POA: Insufficient documentation

## 2020-10-25 DIAGNOSIS — I4891 Unspecified atrial fibrillation: Secondary | ICD-10-CM | POA: Insufficient documentation

## 2020-10-25 DIAGNOSIS — E1151 Type 2 diabetes mellitus with diabetic peripheral angiopathy without gangrene: Secondary | ICD-10-CM | POA: Insufficient documentation

## 2020-10-25 DIAGNOSIS — I255 Ischemic cardiomyopathy: Secondary | ICD-10-CM | POA: Diagnosis not present

## 2020-10-25 DIAGNOSIS — I714 Abdominal aortic aneurysm, without rupture: Secondary | ICD-10-CM | POA: Diagnosis not present

## 2020-10-25 DIAGNOSIS — E785 Hyperlipidemia, unspecified: Secondary | ICD-10-CM | POA: Diagnosis not present

## 2020-10-25 DIAGNOSIS — I252 Old myocardial infarction: Secondary | ICD-10-CM | POA: Insufficient documentation

## 2020-10-25 DIAGNOSIS — Z7982 Long term (current) use of aspirin: Secondary | ICD-10-CM | POA: Diagnosis not present

## 2020-10-25 DIAGNOSIS — I5022 Chronic systolic (congestive) heart failure: Secondary | ICD-10-CM | POA: Diagnosis not present

## 2020-10-25 DIAGNOSIS — Z794 Long term (current) use of insulin: Secondary | ICD-10-CM | POA: Diagnosis not present

## 2020-10-25 DIAGNOSIS — Z7984 Long term (current) use of oral hypoglycemic drugs: Secondary | ICD-10-CM | POA: Diagnosis not present

## 2020-10-25 DIAGNOSIS — I442 Atrioventricular block, complete: Secondary | ICD-10-CM | POA: Insufficient documentation

## 2020-10-25 DIAGNOSIS — I110 Hypertensive heart disease with heart failure: Secondary | ICD-10-CM | POA: Diagnosis not present

## 2020-10-25 DIAGNOSIS — Z87891 Personal history of nicotine dependence: Secondary | ICD-10-CM | POA: Diagnosis not present

## 2020-10-25 DIAGNOSIS — Z79899 Other long term (current) drug therapy: Secondary | ICD-10-CM | POA: Insufficient documentation

## 2020-10-25 DIAGNOSIS — Z4502 Encounter for adjustment and management of automatic implantable cardiac defibrillator: Secondary | ICD-10-CM

## 2020-10-25 DIAGNOSIS — Z885 Allergy status to narcotic agent status: Secondary | ICD-10-CM | POA: Diagnosis not present

## 2020-10-25 DIAGNOSIS — Z8249 Family history of ischemic heart disease and other diseases of the circulatory system: Secondary | ICD-10-CM | POA: Insufficient documentation

## 2020-10-25 HISTORY — PX: BIV ICD GENERATOR CHANGEOUT: EP1194

## 2020-10-25 LAB — GLUCOSE, CAPILLARY: Glucose-Capillary: 145 mg/dL — ABNORMAL HIGH (ref 70–99)

## 2020-10-25 SURGERY — BIV ICD GENERATOR CHANGEOUT

## 2020-10-25 MED ORDER — SODIUM CHLORIDE 0.9 % IV SOLN
INTRAVENOUS | Status: AC
  Filled 2020-10-25: qty 2

## 2020-10-25 MED ORDER — CEFAZOLIN SODIUM-DEXTROSE 2-4 GM/100ML-% IV SOLN
INTRAVENOUS | Status: AC
  Filled 2020-10-25: qty 100

## 2020-10-25 MED ORDER — SODIUM CHLORIDE 0.9 % IV SOLN: INTRAVENOUS | Status: DC

## 2020-10-25 MED ORDER — FENTANYL CITRATE (PF) 100 MCG/2ML IJ SOLN
INTRAMUSCULAR | Status: AC
  Filled 2020-10-25: qty 2

## 2020-10-25 MED ORDER — FENTANYL CITRATE (PF) 100 MCG/2ML IJ SOLN
INTRAMUSCULAR | Status: DC | PRN
  Administered 2020-10-25: 50 ug via INTRAVENOUS

## 2020-10-25 MED ORDER — MIDAZOLAM HCL 5 MG/5ML IJ SOLN
INTRAMUSCULAR | Status: AC
  Filled 2020-10-25: qty 5

## 2020-10-25 MED ORDER — LIDOCAINE HCL (PF) 1 % IJ SOLN
INTRAMUSCULAR | Status: DC | PRN
  Administered 2020-10-25: 60 mL

## 2020-10-25 MED ORDER — LIDOCAINE HCL (PF) 1 % IJ SOLN
INTRAMUSCULAR | Status: AC
  Filled 2020-10-25: qty 30

## 2020-10-25 MED ORDER — SODIUM CHLORIDE 0.9 % IV SOLN
80.0000 mg | INTRAVENOUS | Status: AC
  Administered 2020-10-25: 80 mg

## 2020-10-25 MED ORDER — MIDAZOLAM HCL 5 MG/5ML IJ SOLN
INTRAMUSCULAR | Status: DC | PRN
  Administered 2020-10-25: 2 mg via INTRAVENOUS

## 2020-10-25 MED ORDER — CEFAZOLIN SODIUM-DEXTROSE 2-4 GM/100ML-% IV SOLN
2.0000 g | INTRAVENOUS | Status: AC
Start: 2020-10-25 — End: 2020-10-25
  Administered 2020-10-25: 2 g via INTRAVENOUS

## 2020-10-25 SURGICAL SUPPLY — 3 items
HEMOSTAT SURGICEL 2X4 FIBR (HEMOSTASIS) ×1 IMPLANT
ICD GALLANT HFCRTD CDHFA500Q (ICD Generator) ×1 IMPLANT
TRAY PACEMAKER INSERTION (PACKS) ×1 IMPLANT

## 2020-10-25 NOTE — Discharge Instructions (Signed)

## 2020-10-25 NOTE — Addendum Note (Signed)
Addended by: Cheri Kearns A on: 10/25/2020 11:09 AM   Modules accepted: Level of Service

## 2020-10-25 NOTE — H&P (Signed)
2015     Patient Care Team: Tonia Ghent, MD as PCP - General Angelia Mould, MD (Vascular Surgery) Arta Silence, MD (Gastroenterology) Michael Boston, MD (General Surgery) Eustace Moore, MD (Neurosurgery) Idolina Primer [facility]) Jerline Pain, MD (Cardiology) [name], [facility] as Pharmacist (Pharmacist)   HPI  Luke [name] is a 75 y.o. male admitted for replacement of  CRT-  ICD St Jude  implanted 2014 for intermittent complete heart block in the setting of ischemic cardiomyopathy and left bundle branch block.  Interval normalization of LV function   Diaphragmatic stimulation 2/22 >> CXR no evidence of macro dislodgement   Intercurrent diagnosis of prob "idiopathic pulmonary fibrosis" by High resolution CT    LHC 2013  2V CAD Left anterior descending (LAD): focal 70-80% proximal. Long 40% mid vessel. Right coronary artery (RCA): Diffusely diseased. 50-60% mid vessel, 70% distal, 70% at bifurcation of PDA/PLOM.   DATE TEST EF    6/13 LHC   pLAD70-80; RCA diff 50-60%  3/14 Echo  30-35 %    3/19 Echo 55-65%      The patient denies chest pain,   nocturnal dyspnea, orthopnea or peripheral edema.  There have been no palpitations, lightheadedness or syncope   mild shortness of breath   Records and Results Reviewed  Past Medical History:  Diagnosis Date   AAA (abdominal aortic aneurysm) ([place]) 2011   Per vascular surgery   Anxiety    no med. in use for anxiety but pt. speaks openly of his stress & anxious feelings regarding impending surgery     Atrial fibrillation ([place])    Automatic implantable cardioverter-defibrillator in situ    CAD (coronary artery disease)    Presumed CAD with nuclear scan October 09, 2011,  large anteroseptal MI and inferior MI. Catheterization scheduled October 15, 2011   Cardiomyopathy [facility])    Nuclear, October 09, 2011, EF 30%, multiple focal wall motion abnormalities   Diabetes mellitus    type II   Drug therapy    Hyperkalemia with  spironolactone   Ejection fraction < 50%    EF 30%, nuclear, October 09, 2011   Ejection fraction < 50%    EF previously 30%  //   EF 30-35%, echo, July 14, 2012, severe diffuse hypokinesis, PA pressure 43 mm mercury   Fall due to ice or snow Feb.  19, 2015   HLD (hyperlipidemia)    Hypertension    white coat HTN-- often elevated in office and controlled on outside checks.   ICD (implantable cardioverter-defibrillator) in place    CRT-D placed March, 2014 complete heart block and k dysfunction   LBBB (left bundle branch block)    LBBB on EKG October 11, 2011,  no prior EKG has been done   Low testosterone    Hx of   Myocardial infarction ([place])    Told in 2015-never knew-showed up on stress test for AAA;    Pacemaker    PAD (peripheral artery disease) ([place])    Pancreatitis     Past Surgical History:  Procedure Laterality Date   ABDOMINAL AORTAGRAM N/A 10/28/2011   Procedure: ABDOMINAL [name];  Surgeon: Angelia Mould, MD;  Location[facility] CATH LAB;  Service: Cardiovascular;  Laterality: N/A;   ABDOMINAL AORTIC ANEURYSM REPAIR     EVAR    BI-VENTRICULAR IMPLANTABLE CARDIOVERTER DEFIBRILLATOR N/A 07/16/2012   Procedure: BI-VENTRICULAR IMPLANTABLE CARDIOVERTER DEFIBRILLATOR  (CRT-D);  Surgeon: Deboraha Sprang, MD;  Location: [facility] CATH LAB;  Service:  Cardiovascular;  Laterality: N/A;   CARDIAC CATHETERIZATION     COLONOSCOPY  03/23/2018; 2020   ESOPHAGOGASTRODUODENOSCOPY N/A 02/12/2019   Procedure: ESOPHAGOGASTRODUODENOSCOPY (EGD);  Surgeon: Jackquline Denmark, MD;  Location: [facility] ENDOSCOPY;  Service: Endoscopy;  Laterality: N/A;   ESOPHAGOGASTRODUODENOSCOPY (EGD) WITH PROPOFOL N/A 02/05/2018   Procedure: ESOPHAGOGASTRODUODENOSCOPY (EGD) WITH PROPOFOL;  Surgeon: Mauri Pole, MD;  Location: [facility] ENDOSCOPY;  Service: Endoscopy;  Laterality: N/A;   ESOPHAGOGASTRODUODENOSCOPY (EGD) WITH PROPOFOL N/A 09/27/2018   Procedure: ESOPHAGOGASTRODUODENOSCOPY (EGD) WITH PROPOFOL;  Surgeon: Carol Ada,  MD;  Location: [place];  Service: Endoscopy;  Laterality: N/A;   FOREIGN BODY REMOVAL  09/27/2018   Procedure: FOREIGN BODY REMOVAL;  Surgeon: Carol Ada, MD;  Location: [facility] ENDOSCOPY;  Service: Endoscopy;;   FOREIGN BODY REMOVAL  02/12/2019   Procedure: FOREIGN BODY REMOVAL;  Surgeon: Jackquline Denmark, MD;  Location: [facility] ENDOSCOPY;  Service: Endoscopy;;   HERNIA REPAIR  Jan. 9, 2015   INSERTION OF MESH N/A 05/14/2013   Procedure: INSERTION OF MESH;  Surgeon: Adin Hector, MD;  Location: [place];  Service: General;  Laterality: N/A;   LOWER EXTREMITY ANGIOGRAM Bilateral 10/28/2011   Procedure: LOWER EXTREMITY ANGIOGRAM;  Surgeon: Angelia Mould, MD;  Location: [facility] CATH LAB;  Service: Cardiovascular;  Laterality: Bilateral;   PACEMAKER INSERTION  07-16-12   pacemaker/defibrilator   POLYPECTOMY     POSTERIOR CERVICAL FUSION/FORAMINOTOMY N/A 06/09/2014   Procedure: Laminectomy - Cervical two-Cervcial four posterior cervical instrumented fusion Cervical two-cervical four;  Surgeon: Eustace Moore, MD;  Location: [place] NEURO ORS;  Service: Neurosurgery;  Laterality: N/A;  posterior    SAVORY DILATION N/A 02/05/2018   Procedure: SAVORY DILATION;  Surgeon: Mauri Pole, MD;  Location: [facility] ENDOSCOPY;  Service: Endoscopy;  Laterality: N/A;   TONSILLECTOMY     as a child    UMBILICAL HERNIA REPAIR N/A 05/14/2013   Procedure: LAPAROSCOPIC exploration and repair of hernia in abdominal ;  Surgeon: Adin Hector, MD;  Location: [place];  Service: General;  Laterality: N/A;   UPPER GASTROINTESTINAL ENDOSCOPY  2020    Current Facility-Administered Medications  Medication Dose Route Frequency Provider Last Rate Last Admin   0.9 %  sodium chloride infusion   Intravenous Continuous Deboraha Sprang, MD 50 mL/hr at 10/25/20 0923 New Bag at 10/25/20 0923   ceFAZolin (ANCEF) IVPB 2g/100 mL premix  2 g Intravenous On Call Deboraha Sprang, MD       gentamicin (GARAMYCIN) 80 mg in sodium chloride 0.9 % 500 mL  irrigation  80 mg Irrigation On Call Deboraha Sprang, MD        Allergies  Allergen Reactions   Aldactone [Spironolactone] Other (See Comments)    Hyperkalemia   Codeine Rash and Hives   Januvia [Sitagliptin] Other (See Comments)    Diarrhea and heart racing   Lisinopril     Possible cause of pancreatitis      Social History   Tobacco Use   Smoking status: Former    Packs/day: 2.00    Years: 40.00    Pack years: 80.00    Types: Cigarettes    Quit date: 05/06/2004    Years since quitting: 16.4   Smokeless tobacco: Never  Vaping Use   Vaping Use: Never used  Substance Use Topics   Alcohol use: Yes    Alcohol/week: 0.0 - 2.0 standard drinks    Comment: beer or wine  occassionally   Drug use: No     Family History  Problem Relation Age of Onset   Hypertension Mother    Stroke Mother    Hyperlipidemia Mother    Diabetes Sister    Heart disease Sister        Before age 24   Hypertension Sister    Hyperlipidemia Sister    Heart attack Sister    Lung cancer Father    Hypertension Son    Colon cancer Neg Hx    Prostate cancer Neg Hx    Esophageal cancer Neg Hx    Stomach cancer Neg Hx    Rectal cancer Neg Hx    Colon polyps Neg Hx      Current Meds  Medication Sig   amLODipine (NORVASC) 10 MG tablet TAKE 1 TABLET(10 MG) BY MOUTH DAILY (Patient taking differently: Take 10 mg by mouth daily.)   aspirin EC 81 MG tablet Take 81 mg by mouth daily.   atorvastatin (LIPITOR) 20 MG tablet Take 1 tablet (20 mg total) by mouth daily.   carvedilol (COREG) 25 MG tablet TAKE 1 TABLET BY MOUTH TWICE DAILY WITH MEALS (Patient taking differently: Take 25 mg by mouth 2 (two) times daily with a meal.)   Cholecalciferol (VITAMIN D PO) Take 2,500 Units by mouth daily.   Cyanocobalamin 2500 MCG CHEW Chew 2,500 mcg by mouth daily.   empagliflozin (JARDIANCE) 10 MG TABS tablet Take 1 tablet (10 mg total) by mouth daily before breakfast.   glipiZIDE (GLUCOTROL) 5 MG tablet Take 1 tablet  (5 mg total) by mouth 2 (two) times daily before a meal. (Patient taking differently: Take 10 mg by mouth 2 (two) times daily before a meal.)   hydrALAZINE (APRESOLINE) 25 MG tablet TAKE 1 TABLET(25 MG) BY MOUTH THREE TIMES DAILY (Patient taking differently: Take 25 mg by mouth 3 (three) times daily. TAKE 1 TABLET(25 MG) BY MOUTH THREE TIMES DAILY)   insulin glargine (LANTUS SOLOSTAR) 100 UNIT/ML Solostar Pen INJECT 0.3 TO 0.35 MLS(30 TO 35 UNITS) INTO THE SKIN EVERY DAY (Patient taking differently: Inject 30 Units into the skin at bedtime.)   metFORMIN (GLUCOPHAGE) 500 MG tablet TAKE 2 TABLETS BY MOUTH TWICE DAILY WITH FOOD (Patient taking differently: Take 1,000 mg by mouth 2 (two) times daily with a meal. TAKE 2 TABLETS BY MOUTH TWICE DAILY WITH FOOD)   Multiple Vitamin (MULTIVITAMIN) tablet Take 1 tablet by mouth daily.   omeprazole (PRILOSEC) 40 MG capsule TAKE 1 CAPSULE(40 MG) BY MOUTH TWICE DAILY (Patient taking differently: Take 40 mg by mouth daily.)   sildenafil (REVATIO) 20 MG tablet TAKE 3 TO 4 TABLETS BY MOUTH DAILY AS NEEDED (Patient taking differently: Take 60-80 mg by mouth daily as needed (ED).)     Review of Systems negative except from HPI and PMH  Physical Exam BP (!) 173/78   Pulse 63   Temp 97.9 F (36.6 C) (Oral)   Resp 18   Ht 6\' 2"  (1.88 m)   Wt 86.2 kg   SpO2 98%   BMI 24.39 kg/m  Well developed and well nourished in no acute distress HENT normal E scleral and icterus clear Neck Supple JVP flat; carotids brisk and full Clear to ausculation regular rate and rhythm  no murmurs gallops or rub Soft with active bowel sounds No clubbing cyanosis none Edema Alert and oriented, grossly normal motor and sensory function Skin Warm and Dry    Assessment and  Plan    Ischemic Cardiomyopathy with interval normalization of LV functionICD Criteria  Current LVEF:55%. Within 12 months  prior to implant: No   Heart failure history: Yes, Class II  Cardiomyopathy  history: Yes, Mixed Ischemic and Non-Ischemic Cardiomyopathies.  Atrial Fibrillation/Atrial Flutter: No.  Ventricular tachycardia history: No.  Cardiac arrest history: No.  History of syndromes with risk of sudden death: No.  Previous ICD: Yes, Reason for ICD:  Primary prevention.  Current ICD indication: Primary  PPM indication: No.  Class I or II Bradycardia indication present: No  Beta Blocker therapy for 3 or more months: Yes, prescribed.   Ace Inhibitor/ARB therapy for 3 or more months: No, medical reason.   I have seen Luke [name] is a 75 y.o. malepre-procedural consideration of ICD reimplant for prevention of sudden death.  The patient's chart has been reviewed and they meet criteria for ICD implant.  I have had a thorough discussion with the patient reviewing options.  The patient and their family (if available) have had opportunities to ask questions and have them answered. The patient and I have decided together through the [employer] to reimplant ICD at this time.  Risks, benefits, alternatives to ICD implantation were discussed in detail with the patient today. The patient  understands that the risks include but are not limited to bleeding, infection, pneumothorax, perforation, tamponade, vascular damage, renal failure, MI, stroke, death, inappropriate shocks, and lead dislodgement and   wishes to proceed.     Chronic HF systolic     Implantable Defibrillator CRT  St Jude @ [facility]    Complete heart Block   Hyperlipidemia   Diabetes   Hypertension    Diaphragmatic stimulation  Pulmonary fibrosis    For ICD generator replacement   We have reviewed the benefits and risks of generator replacement.  These include but are not limited to lead fracture and infection.  The patient understands, agrees and is willing to proceed.

## 2020-10-25 NOTE — Progress Notes (Signed)
Pt ambulated without difficulty or bleeding.   Discharged home with wife who will drive and stay with pt x 24 hrs 

## 2020-10-25 NOTE — Progress Notes (Signed)
Remote ICD transmission.   

## 2020-11-07 ENCOUNTER — Other Ambulatory Visit: Payer: Self-pay

## 2020-11-07 ENCOUNTER — Ambulatory Visit (INDEPENDENT_AMBULATORY_CARE_PROVIDER_SITE_OTHER): Payer: Medicare Other

## 2020-11-07 DIAGNOSIS — I255 Ischemic cardiomyopathy: Secondary | ICD-10-CM | POA: Diagnosis not present

## 2020-11-07 DIAGNOSIS — I442 Atrioventricular block, complete: Secondary | ICD-10-CM

## 2020-11-07 LAB — CUP PACEART INCLINIC DEVICE CHECK
Battery Remaining Longevity: 93 mo
Brady Statistic RA Percent Paced: 24 %
Brady Statistic RV Percent Paced: 99.95 %
Date Time Interrogation Session: 20220705155243
HighPow Impedance: 73.125
Implantable Lead Implant Date: 20140313
Implantable Lead Implant Date: 20140313
Implantable Lead Implant Date: 20140313
Implantable Lead Location: 753858
Implantable Lead Location: 753859
Implantable Lead Location: 753860
Implantable Pulse Generator Implant Date: 20220622
Lead Channel Impedance Value: 412.5 Ohm
Lead Channel Impedance Value: 450 Ohm
Lead Channel Impedance Value: 875 Ohm
Lead Channel Pacing Threshold Amplitude: 0.625 V
Lead Channel Pacing Threshold Amplitude: 0.75 V
Lead Channel Pacing Threshold Amplitude: 0.75 V
Lead Channel Pacing Threshold Amplitude: 1.5 V
Lead Channel Pacing Threshold Amplitude: 1.5 V
Lead Channel Pacing Threshold Pulse Width: 0.5 ms
Lead Channel Pacing Threshold Pulse Width: 0.5 ms
Lead Channel Pacing Threshold Pulse Width: 0.5 ms
Lead Channel Pacing Threshold Pulse Width: 0.6 ms
Lead Channel Pacing Threshold Pulse Width: 0.6 ms
Lead Channel Sensing Intrinsic Amplitude: 3.7 mV
Lead Channel Setting Pacing Amplitude: 1.625
Lead Channel Setting Pacing Amplitude: 2.5 V
Lead Channel Setting Pacing Amplitude: 2.5 V
Lead Channel Setting Pacing Pulse Width: 0.5 ms
Lead Channel Setting Pacing Pulse Width: 0.6 ms
Lead Channel Setting Sensing Sensitivity: 0.5 mV
Pulse Gen Serial Number: 810030647

## 2020-11-07 NOTE — Patient Instructions (Signed)
   After Your ICD (Implantable Cardiac Defibrillator)    Monitor your defibrillator site for redness, swelling, and drainage. Call the device clinic at [phone] if you experience these symptoms or fever/chills.  Your incision was closed with Dermabond:  You may shower 1 day after your defibrillator implant and wash your incision with soap and water. Avoid lotions, ointments, or perfumes over your incision until it is well-healed.  You may use a hot tub or a pool after your wound check appointment if the incision is completely closed.  Do not lift, push or pull greater than 10 pounds with the affected arm until 6 weeks after your procedure. There are no other restrictions in arm movement after your wound check appointment.  Your ICD is MRI compatible.  Your ICD is designed to protect you from life threatening heart rhythms. Because of this, you may receive a shock.   1 shock with no symptoms:  Call the office during business hours. 1 shock with symptoms (chest pain, chest pressure, dizziness, lightheadedness, shortness of breath, overall feeling unwell):  Call 911. If you experience 2 or more shocks in 24 hours:  Call 911. If you receive a shock, you should not drive.  [place] DMV - no driving for 6 months if you receive appropriate therapy from your ICD.   ICD Alerts:  Some alerts are vibratory and others beep. These are NOT emergencies. Please call our office to let us know. If this occurs at night or on weekends, it can wait until the next business day. Send a remote transmission.  If your device is capable of reading fluid status (for heart failure), you will be offered monthly monitoring to review this with you.   Remote monitoring is used to monitor your ICD from home. This monitoring is scheduled every 91 days by our office. It allows [place] to keep an eye on the functioning of your device to ensure it is working properly. You will routinely see your Electrophysiologist annually (more  often if necessary).

## 2020-11-07 NOTE — Progress Notes (Signed)
Wound check appointment. Dermabond removed. Wound without redness or edema. Incision edges approximated, wound well healed. Normal device function. Thresholds, P wave sensing, and impedances consistent with implant measurements. Device programmed at appropriate safety margin for chronic leads. Histogram distribution appropriate for patient and level of activity. No mode switches or ventricular arrhythmias noted. Patient educated about wound care, arm mobility, lifting restrictions, shock plan. Patient is enrolled in remote monitoring, next scheduled check 01/24/21.  ROV with Dr. Caryl Comes 01/31/21.

## 2020-11-13 ENCOUNTER — Ambulatory Visit (INDEPENDENT_AMBULATORY_CARE_PROVIDER_SITE_OTHER): Payer: Medicare Other | Admitting: Pulmonary Disease

## 2020-11-13 ENCOUNTER — Encounter: Payer: Self-pay | Admitting: Pulmonary Disease

## 2020-11-13 ENCOUNTER — Other Ambulatory Visit: Payer: Self-pay

## 2020-11-13 VITALS — BP 168/80 | HR 71 | Ht 73.0 in | Wt 193.0 lb

## 2020-11-13 DIAGNOSIS — J849 Interstitial pulmonary disease, unspecified: Secondary | ICD-10-CM

## 2020-11-13 DIAGNOSIS — J432 Centrilobular emphysema: Secondary | ICD-10-CM | POA: Diagnosis not present

## 2020-11-13 DIAGNOSIS — Z87891 Personal history of nicotine dependence: Secondary | ICD-10-CM | POA: Diagnosis not present

## 2020-11-13 NOTE — Progress Notes (Signed)
Synopsis: Referred in July 2022 for abnormal CT chest by Tonia Ghent, MD  Subjective:   PATIENT ID: Luke [name] GENDER: male DOB: 08/30/1945, MRN: [id number]  Chief Complaint  Patient presents with   Other    Abnormal CT imaging      PMH AAA, CAD, atrial fibrillation.  Patient presents after having CT imaging which revealed significant areas of emphysema and basilar scarring.  He has no real significant respiratory symptoms at this time.  He is able to complete most of activities of daily living.  I know his wife well she is also one of my patients and lung cancer survivors.  His CT scan revealed significant lower lobe areas of scarring and no real distinct/true honeycombing.  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  He was a longstanding smoker quit many years ago he is currently not on any inhalers.   Past Medical History:  Diagnosis Date   AAA (abdominal aortic aneurysm) ([place]) 2011   Per vascular surgery   Anxiety    no med. in use for anxiety but pt. speaks openly of his stress & anxious feelings regarding impending surgery     Atrial fibrillation ([place])    Automatic implantable cardioverter-defibrillator in situ    CAD (coronary artery disease)    Presumed CAD with nuclear scan October 09, 2011,  large anteroseptal MI and inferior MI. Catheterization scheduled October 15, 2011   Cardiomyopathy [facility])    Nuclear, October 09, 2011, EF 30%, multiple focal wall motion abnormalities   Diabetes mellitus    type II   Drug therapy    Hyperkalemia with spironolactone   Ejection fraction < 50%    EF 30%, nuclear, October 09, 2011   Ejection fraction < 50%    EF previously 30%  //   EF 30-35%, echo, July 14, 2012, severe diffuse hypokinesis, PA pressure 43 mm mercury   Fall due to ice or snow Feb.  19, 2015   HLD (hyperlipidemia)    Hypertension    white coat HTN-- often elevated in office and  controlled on outside checks.   ICD (implantable cardioverter-defibrillator) in place    CRT-D placed March, 2014 complete heart block and k dysfunction   LBBB (left bundle branch block)    LBBB on EKG October 11, 2011,  no prior EKG has been done   Low testosterone    Hx of   Myocardial infarction ([place])    Told in 2015-never knew-showed up on stress test for AAA;    Pacemaker    PAD (peripheral artery disease) ([place])    Pancreatitis      Family History  Problem Relation Age of Onset   Hypertension Mother    Stroke Mother    Hyperlipidemia Mother    Diabetes Sister    Heart disease Sister        Before age 38   Hypertension Sister    Hyperlipidemia Sister    Heart attack Sister    Lung cancer Father    Hypertension Son    Colon cancer Neg Hx    Prostate cancer Neg Hx    Esophageal cancer Neg Hx    Stomach cancer Neg Hx    Rectal cancer Neg Hx    Colon polyps Neg Hx      Past Surgical History:  Procedure Laterality Date   ABDOMINAL AORTAGRAM N/A 10/28/2011  Procedure: ABDOMINAL AORTAGRAM;  Surgeon: Angelia Mould, MD;  Location: [facility] CATH LAB;  Service: Cardiovascular;  Laterality: N/A;   ABDOMINAL AORTIC ANEURYSM REPAIR     EVAR    BI-VENTRICULAR IMPLANTABLE CARDIOVERTER DEFIBRILLATOR N/A 07/16/2012   Procedure: BI-VENTRICULAR IMPLANTABLE CARDIOVERTER DEFIBRILLATOR  (CRT-D);  Surgeon: Deboraha Sprang, MD;  Location: [facility] CATH LAB;  Service: Cardiovascular;  Laterality: N/A;   BIV ICD GENERATOR CHANGEOUT N/A 10/25/2020   Procedure: BIV ICD GENERATOR CHANGEOUT;  Surgeon: Deboraha Sprang, MD;  Location: [facility] CV LAB;  Service: Cardiovascular;  Laterality: N/A;   CARDIAC CATHETERIZATION     COLONOSCOPY  03/23/2018; 2020   ESOPHAGOGASTRODUODENOSCOPY N/A 02/12/2019   Procedure: ESOPHAGOGASTRODUODENOSCOPY (EGD);  Surgeon: Jackquline Denmark, MD;  Location: [facility] ENDOSCOPY;  Service: Endoscopy;  Laterality: N/A;   ESOPHAGOGASTRODUODENOSCOPY (EGD) WITH PROPOFOL N/A 02/05/2018    Procedure: ESOPHAGOGASTRODUODENOSCOPY (EGD) WITH PROPOFOL;  Surgeon: Mauri Pole, MD;  Location: [facility] ENDOSCOPY;  Service: Endoscopy;  Laterality: N/A;   ESOPHAGOGASTRODUODENOSCOPY (EGD) WITH PROPOFOL N/A 09/27/2018   Procedure: ESOPHAGOGASTRODUODENOSCOPY (EGD) WITH PROPOFOL;  Surgeon: Carol Ada, MD;  Location: [place];  Service: Endoscopy;  Laterality: N/A;   FOREIGN BODY REMOVAL  09/27/2018   Procedure: FOREIGN BODY REMOVAL;  Surgeon: Carol Ada, MD;  Location: [facility] ENDOSCOPY;  Service: Endoscopy;;   FOREIGN BODY REMOVAL  02/12/2019   Procedure: FOREIGN BODY REMOVAL;  Surgeon: Jackquline Denmark, MD;  Location: [facility] ENDOSCOPY;  Service: Endoscopy;;   HERNIA REPAIR  Jan. 9, 2015   INSERTION OF MESH N/A 05/14/2013   Procedure: INSERTION OF MESH;  Surgeon: Adin Hector, MD;  Location: [place];  Service: General;  Laterality: N/A;   LOWER EXTREMITY ANGIOGRAM Bilateral 10/28/2011   Procedure: LOWER EXTREMITY ANGIOGRAM;  Surgeon: Angelia Mould, MD;  Location: [facility] CATH LAB;  Service: Cardiovascular;  Laterality: Bilateral;   PACEMAKER INSERTION  07-16-12   pacemaker/defibrilator   POLYPECTOMY     POSTERIOR CERVICAL FUSION/FORAMINOTOMY N/A 06/09/2014   Procedure: Laminectomy - Cervical two-Cervcial four posterior cervical instrumented fusion Cervical two-cervical four;  Surgeon: Eustace Moore, MD;  Location: [place] NEURO ORS;  Service: Neurosurgery;  Laterality: N/A;  posterior    SAVORY DILATION N/A 02/05/2018   Procedure: SAVORY DILATION;  Surgeon: Mauri Pole, MD;  Location: [facility] ENDOSCOPY;  Service: Endoscopy;  Laterality: N/A;   TONSILLECTOMY     as a child    UMBILICAL HERNIA REPAIR N/A 05/14/2013   Procedure: LAPAROSCOPIC exploration and repair of hernia in abdominal ;  Surgeon: Adin Hector, MD;  Location: [place];  Service: General;  Laterality: N/A;   UPPER GASTROINTESTINAL ENDOSCOPY  2020    Social History   Socioeconomic History   Marital status: Married    Spouse name:  Patti   Number of children: 1   Years of education: Not on file   Highest education level: Not on file  Occupational History   Occupation: retired  Tobacco Use   Smoking status: Former    Packs/day: 2.00    Years: 40.00    Pack years: 80.00    Types: Cigarettes    Start date: 1964    Quit date: 05/06/2004    Years since quitting: 16.5   Smokeless tobacco: Never  Vaping Use   Vaping Use: Never used  Substance and Sexual Activity   Alcohol use: Yes    Alcohol/week: 0.0 - 2.0 standard drinks    Comment: beer or wine  occassionally   Drug use: No   Sexual activity:  Yes    Partners: Female  Other Topics Concern   Not on file  Social History Narrative   [place] vet, he has known service related agent orange exposure.    Retired   Chiropractor daily.     Social Determinants of Health   Financial Resource Strain: Medium Risk   Difficulty of Paying Living Expenses: Somewhat hard  Food Insecurity: Not on file  Transportation Needs: Not on file  Physical Activity: Not on file  Stress: Not on file  Social Connections: Not on file  Intimate Partner Violence: Not on file     Allergies  Allergen Reactions   Aldactone [Spironolactone] Other (See Comments)    Hyperkalemia   Codeine Rash and Hives   Januvia [Sitagliptin] Other (See Comments)    Diarrhea and heart racing   Lisinopril     Possible cause of pancreatitis     Outpatient Medications Prior to Visit  Medication Sig Dispense Refill   amLODipine (NORVASC) 10 MG tablet TAKE 1 TABLET(10 MG) BY MOUTH DAILY 90 tablet 3   aspirin EC 81 MG tablet Take 81 mg by mouth daily.     atorvastatin (LIPITOR) 20 MG tablet Take 1 tablet (20 mg total) by mouth daily. 90 tablet 3   carvedilol (COREG) 25 MG tablet TAKE 1 TABLET BY MOUTH TWICE DAILY WITH MEALS 180 tablet 3   Cholecalciferol (VITAMIN D PO) Take 2,500 Units by mouth daily.     Cyanocobalamin 2500 MCG CHEW Chew 2,500 mcg by mouth daily.     empagliflozin (JARDIANCE) 10 MG TABS  tablet Take 1 tablet (10 mg total) by mouth daily before breakfast. 90 tablet 1   glipiZIDE (GLUCOTROL) 5 MG tablet Take 1 tablet (5 mg total) by mouth 2 (two) times daily before a meal. (Patient taking differently: Take 10 mg by mouth 2 (two) times daily before a meal.) 180 tablet 1   glucose blood (ACCU-CHEK AVIVA PLUS) test strip USE TO CHECK BLOOD GLUCOSE UP TO 3 TIMES DAILY.     hydrALAZINE (APRESOLINE) 25 MG tablet TAKE 1 TABLET(25 MG) BY MOUTH THREE TIMES DAILY 90 tablet 1   insulin glargine (LANTUS SOLOSTAR) 100 UNIT/ML Solostar Pen INJECT 0.3 TO 0.35 MLS(30 TO 35 UNITS) INTO THE SKIN EVERY DAY (Patient taking differently: Inject 30 Units into the skin at bedtime.) 15 mL 3   metFORMIN (GLUCOPHAGE) 500 MG tablet TAKE 2 TABLETS BY MOUTH TWICE DAILY WITH FOOD (Patient taking differently: Take 1,000 mg by mouth 2 (two) times daily with a meal. TAKE 2 TABLETS BY MOUTH TWICE DAILY WITH FOOD) 360 tablet 2   Multiple Vitamin (MULTIVITAMIN) tablet Take 1 tablet by mouth daily.     omeprazole (PRILOSEC) 40 MG capsule TAKE 1 CAPSULE(40 MG) BY MOUTH TWICE DAILY (Patient taking differently: Take 40 mg by mouth daily.) 180 capsule 0   sildenafil (REVATIO) 20 MG tablet TAKE 3 TO 4 TABLETS BY MOUTH DAILY AS NEEDED (Patient taking differently: Take 60-80 mg by mouth daily as needed (ED).) 50 tablet 11   No facility-administered medications prior to visit.    Review of Systems  Constitutional:  Negative for chills, fever, malaise/fatigue and weight loss.  HENT:  Negative for hearing loss, sore throat and tinnitus.   Eyes:  Negative for blurred vision and double vision.  Respiratory:  Negative for cough, hemoptysis, sputum production, shortness of breath, wheezing and stridor.   Cardiovascular:  Negative for chest pain, palpitations, orthopnea, leg swelling and PND.  Gastrointestinal:  Negative for abdominal pain,  constipation, diarrhea, heartburn, nausea and vomiting.  Genitourinary:  Negative for dysuria,  hematuria and urgency.  Musculoskeletal:  Negative for joint pain and myalgias.  Skin:  Negative for itching and rash.  Neurological:  Negative for dizziness, tingling, weakness and headaches.  Endo/Heme/Allergies:  Negative for environmental allergies. Does not bruise/bleed easily.  Psychiatric/Behavioral:  Negative for depression. The patient is not nervous/anxious and does not have insomnia.   All other systems reviewed and are negative.   Objective:  Physical Exam Vitals reviewed.  Constitutional:      General: He is not in acute distress.    Appearance: He is well-developed.  HENT:     Head: Normocephalic and atraumatic.  Eyes:     General: No scleral icterus.    Conjunctiva/sclera: Conjunctivae normal.     Pupils: Pupils are equal, round, and reactive to light.  Neck:     Vascular: No JVD.     Trachea: No tracheal deviation.  Cardiovascular:     Rate and Rhythm: Normal rate and regular rhythm.     Heart sounds: Normal heart sounds. No murmur heard. Pulmonary:     Effort: Pulmonary effort is normal. No tachypnea, accessory muscle usage or respiratory distress.     Breath sounds: No stridor. No wheezing, rhonchi or rales.     Comments: Basilar crackles Abdominal:     General: Bowel sounds are normal. There is no distension.     Palpations: Abdomen is soft.     Tenderness: There is no abdominal tenderness.  Musculoskeletal:        General: No tenderness.     Cervical back: Neck supple.  Lymphadenopathy:     Cervical: No cervical adenopathy.  Skin:    General: Skin is warm and dry.     Capillary Refill: Capillary refill takes less than 2 seconds.     Findings: No rash.  Neurological:     Mental Status: He is alert and oriented to person, place, and time.  Psychiatric:        Behavior: Behavior normal.     Vitals:   11/13/20 1520  BP: (!) 168/80  Pulse: 71  SpO2: 96%  Weight: 193 lb (87.5 kg)  Height: 6\' 1"  (1.854 m)   96% on RA BMI Readings from Last 3  Encounters:  11/13/20 25.46 kg/m  10/25/20 24.39 kg/m  09/15/20 25.20 kg/m   Wt Readings from Last 3 Encounters:  11/13/20 193 lb (87.5 kg)  10/25/20 190 lb (86.2 kg)  09/15/20 191 lb (86.6 kg)     CBC    Component Value Date/Time   WBC 9.7 10/23/2020 1057   WBC 10.2 09/15/2020 2227   RBC 4.67 10/23/2020 1057   RBC 4.89 09/15/2020 2227   HGB 15.3 10/23/2020 1057   HCT 45.2 10/23/2020 1057   PLT 265 10/23/2020 1057   MCV 97 10/23/2020 1057   MCH 32.8 10/23/2020 1057   MCH 33.1 09/15/2020 2227   MCHC 33.8 10/23/2020 1057   MCHC 34.0 09/15/2020 2227   RDW 13.1 10/23/2020 1057   LYMPHSABS 2.2 09/15/2020 2227   MONOABS 0.9 09/15/2020 2227   EOSABS 0.3 09/15/2020 2227   BASOSABS 0.1 09/15/2020 2227     Chest Imaging: 10/03/2020 high-resolution CT scan of the chest: Fibrotic interstitial lung disease without frank honeycombing in the lower lobes there is a gradient present however.  Does have upper lobe significant centrilobular emphysema The patient's images have been independently reviewed by me.    Pulmonary Functions Testing Results: No  flowsheet data found.  FeNO:   Pathology:   Echocardiogram:   Heart Catheterization:     Assessment & Plan:     ICD-10-CM   1. ILD (interstitial lung disease) (HCC)  J84.9 Aldolase    ANA    CK total and CKMB (cardiac)not at [facility]    Anti-scleroderma antibody    Anti-DNA antibody, double-stranded    Angiotensin converting enzyme    ANCA screen with reflex titer    Sjogren's syndrome antibods(ssa + ssb)    Sedimentation rate    Rheumatoid factor    Cyclic citrul peptide antibody, IgG    Hypersensitivity Pneumonitis    Mpo/pr-3 (anca) antibodies    Mpo/pr-3 (anca) antibodies    Hypersensitivity Pneumonitis    Cyclic citrul peptide antibody, IgG    Rheumatoid factor    Sedimentation rate    Sjogren's syndrome antibods(ssa + ssb)    ANCA screen with reflex titer    Angiotensin converting enzyme    Anti-DNA antibody,  double-stranded    Anti-scleroderma antibody    CK total and CKMB (cardiac)not at [facility]    ANA    Aldolase    2. Former smoker  Z87.891     3. Centrilobular emphysema ([place])  J43.2       Discussion:  This is a 75 year old male, longstanding history of smoking, was in [place], exposed to agent orange, worked in Psychologist, counselling for many years.  Has abnormal parenchymal findings on CT imaging concerning for a mixed pattern of pulmonary fibrosis with emphysema.  Plan: I think we should to complete a full interstitial lung disease work-up as he has had progression of imaging changes since 2013. We will complete ILD labs.  Please see orders. ILD questionnaire was given to the patient. We will have him set up and see one of our ILD specialist, Dr. Chase Caller in consultation after work is complete.    Current Outpatient Medications:    amLODipine (NORVASC) 10 MG tablet, TAKE 1 TABLET(10 MG) BY MOUTH DAILY, Disp: 90 tablet, Rfl: 3   aspirin EC 81 MG tablet, Take 81 mg by mouth daily., Disp: , Rfl:    atorvastatin (LIPITOR) 20 MG tablet, Take 1 tablet (20 mg total) by mouth daily., Disp: 90 tablet, Rfl: 3   carvedilol (COREG) 25 MG tablet, TAKE 1 TABLET BY MOUTH TWICE DAILY WITH MEALS, Disp: 180 tablet, Rfl: 3   Cholecalciferol (VITAMIN D PO), Take 2,500 Units by mouth daily., Disp: , Rfl:    Cyanocobalamin 2500 MCG CHEW, Chew 2,500 mcg by mouth daily., Disp: , Rfl:    empagliflozin (JARDIANCE) 10 MG TABS tablet, Take 1 tablet (10 mg total) by mouth daily before breakfast., Disp: 90 tablet, Rfl: 1   glipiZIDE (GLUCOTROL) 5 MG tablet, Take 1 tablet (5 mg total) by mouth 2 (two) times daily before a meal. (Patient taking differently: Take 10 mg by mouth 2 (two) times daily before a meal.), Disp: 180 tablet, Rfl: 1   glucose blood (ACCU-CHEK AVIVA PLUS) test strip, USE TO CHECK BLOOD GLUCOSE UP TO 3 TIMES DAILY., Disp: , Rfl:    hydrALAZINE (APRESOLINE) 25 MG tablet, TAKE 1 TABLET(25 MG) BY MOUTH THREE  TIMES DAILY, Disp: 90 tablet, Rfl: 1   insulin glargine (LANTUS SOLOSTAR) 100 UNIT/ML Solostar Pen, INJECT 0.3 TO 0.35 MLS(30 TO 35 UNITS) INTO THE SKIN EVERY DAY (Patient taking differently: Inject 30 Units into the skin at bedtime.), Disp: 15 mL, Rfl: 3   metFORMIN (GLUCOPHAGE) 500 MG tablet, TAKE 2 TABLETS  BY MOUTH TWICE DAILY WITH FOOD (Patient taking differently: Take 1,000 mg by mouth 2 (two) times daily with a meal. TAKE 2 TABLETS BY MOUTH TWICE DAILY WITH FOOD), Disp: 360 tablet, Rfl: 2   Multiple Vitamin (MULTIVITAMIN) tablet, Take 1 tablet by mouth daily., Disp: , Rfl:    omeprazole (PRILOSEC) 40 MG capsule, TAKE 1 CAPSULE(40 MG) BY MOUTH TWICE DAILY (Patient taking differently: Take 40 mg by mouth daily.), Disp: 180 capsule, Rfl: 0   sildenafil (REVATIO) 20 MG tablet, TAKE 3 TO 4 TABLETS BY MOUTH DAILY AS NEEDED (Patient taking differently: Take 60-80 mg by mouth daily as needed (ED).), Disp: 50 tablet, Rfl: 11  I spent 62 minutes dedicated to the care of this patient on the date of this encounter to include pre-visit review of records, face-to-face time with the patient discussing conditions above, post visit ordering of testing, clinical documentation with the electronic health record, making appropriate referrals as documented, and communicating necessary findings to members of the patients care team.   Garner Nash, DO [facility] 11/13/2020 6:27 PM

## 2020-11-13 NOTE — Patient Instructions (Addendum)
Thank you for visiting Dr. Valeta Harms at [facility]. Today we recommend the following:  ILD questionaire ILD Labs   Return in about 6 weeks (around 12/25/2020). New patient appt with Dr. Chase Caller ILD Clinic.     Please do your part to reduce the spread of COVID-19.

## 2020-11-14 LAB — SEDIMENTATION RATE: Sed Rate: 15 mm/hr (ref 0–20)

## 2020-11-15 LAB — SJOGREN'S SYNDROME ANTIBODS(SSA + SSB)
SSA (Ro) (ENA) Antibody, IgG: <1 AI
SSB (La) (ENA) Antibody, IgG: <1 AI

## 2020-11-15 LAB — RHEUMATOID FACTOR: Rheumatoid fact SerPl-aCnc: <14 IU/mL (ref <14)

## 2020-11-15 LAB — ALDOLASE: Aldolase: 5.3 U/L (ref <=8.1)

## 2020-11-15 LAB — MPO/PR-3 (ANCA) ANTIBODIES
Myeloperoxidase Abs: <1 AI
Serine Protease 3: <1 AI

## 2020-11-15 LAB — ANTI-SCLERODERMA ANTIBODY: Scleroderma (Scl-70) (ENA) Antibody, IgG: <1 AI

## 2020-11-15 LAB — CK TOTAL AND CKMB (NOT AT ARMC)
CK, MB: 0.8 ng/mL (ref 0–5.0)
Relative Index: 1.3 (ref 0–4.0)
Total CK: 63 U/L (ref 44–196)

## 2020-11-15 LAB — ANCA SCREEN W REFLEX TITER: ANCA Screen: NEGATIVE

## 2020-11-15 LAB — ANTI-DNA ANTIBODY, DOUBLE-STRANDED: ds DNA Ab: <1 IU/mL

## 2020-11-15 LAB — CYCLIC CITRUL PEPTIDE ANTIBODY, IGG: Cyclic Citrullin Peptide Ab: <16 UNITS

## 2020-11-15 LAB — ANGIOTENSIN CONVERTING ENZYME: Angiotensin-Converting Enzyme: 27 U/L (ref 9–67)

## 2020-11-15 LAB — ANA: Anti Nuclear Antibody (ANA): NEGATIVE

## 2020-11-17 LAB — HYPERSENSITIVITY PNEUMONITIS
A. Pullulans Abs: NEGATIVE
A.Fumigatus #1 Abs: NEGATIVE
Micropolyspora faeni, IgG: NEGATIVE
Pigeon Serum Abs: NEGATIVE
Thermoact. Saccharii: NEGATIVE
Thermoactinomyces vulgaris, IgG: NEGATIVE

## 2020-11-18 ENCOUNTER — Other Ambulatory Visit: Payer: Self-pay | Admitting: Cardiology

## 2020-12-11 ENCOUNTER — Telehealth: Payer: Self-pay

## 2020-12-11 DIAGNOSIS — E113293 Type 2 diabetes mellitus with mild nonproliferative diabetic retinopathy without macular edema, bilateral: Secondary | ICD-10-CM | POA: Diagnosis not present

## 2020-12-11 LAB — HM DIABETES EYE EXAM

## 2020-12-11 NOTE — Chronic Care Management (AMB) (Addendum)
    Chronic Care Management Pharmacy Assistant   Name: Luke [name]  MRN: [id number] DOB: [birth year]   Reason for Encounter: CCM Reminder Call   Conditions to be addressed/monitored: CAD, HTN, HLD, DMII, and CKD Stage 3   Medications: Outpatient Encounter Medications as of 12/11/2020  Medication Sig   amLODipine (NORVASC) 10 MG tablet TAKE 1 TABLET(10 MG) BY MOUTH DAILY   aspirin EC 81 MG tablet Take 81 mg by mouth daily.   atorvastatin (LIPITOR) 20 MG tablet Take 1 tablet (20 mg total) by mouth daily.   carvedilol (COREG) 25 MG tablet TAKE 1 TABLET BY MOUTH TWICE DAILY WITH MEALS   Cholecalciferol (VITAMIN D PO) Take 2,500 Units by mouth daily.   Cyanocobalamin 2500 MCG CHEW Chew 2,500 mcg by mouth daily.   empagliflozin (JARDIANCE) 10 MG TABS tablet Take 1 tablet (10 mg total) by mouth daily before breakfast.   glipiZIDE (GLUCOTROL) 5 MG tablet Take 1 tablet (5 mg total) by mouth 2 (two) times daily before a meal. (Patient taking differently: Take 10 mg by mouth 2 (two) times daily before a meal.)   glucose blood (ACCU-CHEK AVIVA PLUS) test strip USE TO CHECK BLOOD GLUCOSE UP TO 3 TIMES DAILY.   hydrALAZINE (APRESOLINE) 25 MG tablet TAKE 1 TABLET(25 MG) BY MOUTH THREE TIMES DAILY   insulin glargine (LANTUS SOLOSTAR) 100 UNIT/ML Solostar Pen INJECT 0.3 TO 0.35 MLS(30 TO 35 UNITS) INTO THE SKIN EVERY DAY (Patient taking differently: Inject 30 Units into the skin at bedtime.)   metFORMIN (GLUCOPHAGE) 500 MG tablet TAKE 2 TABLETS BY MOUTH TWICE DAILY WITH FOOD (Patient taking differently: Take 1,000 mg by mouth 2 (two) times daily with a meal. TAKE 2 TABLETS BY MOUTH TWICE DAILY WITH FOOD)   Multiple Vitamin (MULTIVITAMIN) tablet Take 1 tablet by mouth daily.   omeprazole (PRILOSEC) 40 MG capsule TAKE 1 CAPSULE(40 MG) BY MOUTH TWICE DAILY (Patient taking differently: Take 40 mg by mouth daily.)   sildenafil (REVATIO) 20 MG tablet TAKE 3 TO 4 TABLETS BY MOUTH DAILY AS NEEDED (Patient  taking differently: Take 60-80 mg by mouth daily as needed (ED).)   No facility-administered encounter medications on file as of 12/11/2020.   [name] did not answer the telephone  to remind him of his upcoming telephone visit with [name] on 12/18/2020 at 8:30am. Patient was reminded by voicemail to have all medications, supplements and any blood glucose and blood pressure readings available for review at appointment.   Star Rating Drugs: Medication:  Last Fill: Day Supply Metformin '500mg'$  11/21/20 90 Glipizide '5mg'$   11/12/20 90 Jardiance '10mg'$  09/26/20 90 Atorvastatin '20mg'$  09/14/20 [place], CPP notified  Avel Sensor, [place] Assistant [phone]  I have reviewed the care management and care coordination activities outlined in this encounter and I am certifying that I agree with the content of this note. No further action required.  [name], PharmD Clinical Pharmacist [facility] at [facility] [phone]

## 2020-12-13 ENCOUNTER — Telehealth: Payer: Medicare Other

## 2020-12-13 ENCOUNTER — Telehealth: Payer: Self-pay | Admitting: Pharmacist

## 2020-12-13 NOTE — Telephone Encounter (Signed)
Patient called to report he has ~10 day supply remaining of Lantus from [employer] pt assistance and is requesting a refill.  Obtained refill form from [employer], completed form and faxed back to expedite refill.

## 2020-12-18 ENCOUNTER — Other Ambulatory Visit: Payer: Self-pay

## 2020-12-18 ENCOUNTER — Ambulatory Visit (INDEPENDENT_AMBULATORY_CARE_PROVIDER_SITE_OTHER): Payer: Medicare Other

## 2020-12-18 VITALS — BP 133/74

## 2020-12-18 DIAGNOSIS — I10 Essential (primary) hypertension: Secondary | ICD-10-CM

## 2020-12-18 DIAGNOSIS — E785 Hyperlipidemia, unspecified: Secondary | ICD-10-CM | POA: Diagnosis not present

## 2020-12-18 DIAGNOSIS — E1159 Type 2 diabetes mellitus with other circulatory complications: Secondary | ICD-10-CM | POA: Diagnosis not present

## 2020-12-18 NOTE — Progress Notes (Signed)
Contacted [employer] to verify they did receive Lantus refill request. Representative stated he did have it but noted that the manufacturer only mails out refills Monday-Wednesday. He states medication should be mailed out this week but if it is not it will be held til next week. He stated patients are unable to request their own refills- the providers office has to do so. Patient was notified.    [name], CPP notified  [name], [place] Pharmacy Assistant [phone]

## 2020-12-18 NOTE — Progress Notes (Signed)
Chronic Care Management Pharmacy Note  12/18/2020 Name:  Luke [name] MRN:  [id number] DOB:  Sep 22, 1945  Subjective: Luke [name] is an 75 y.o. year old male who is a primary patient of Damita Dunnings, Elveria Rising, MD.  The CCM team was consulted for assistance with disease management and care coordination needs.    Engaged with patient by telephone for follow up visit in response to provider referral for pharmacy case management and/or care coordination services.   Consent to Services:  The patient was given information about Chronic Care Management services, agreed to services, and gave verbal consent prior to initiation of services.  Please see initial visit note for detailed documentation.   Patient Care Team: Tonia Ghent, MD as PCP - General Angelia Mould, MD (Vascular Surgery) Arta Silence, MD (Gastroenterology) Michael Boston, MD (General Surgery) Eustace Moore, MD (Neurosurgery) Juluis Rainier [facility]) Jerline Pain, MD (Cardiology) [name], [facility] as Pharmacist (Pharmacist)  Recent office visits: 09/14/20 - PCP Damita Dunnings)- Pt presented for DM and chronic condition follow up. Eye exam is due. His BG averaging 99. DM foot exam normal. Leg cramps, increase hydration. If continued, may cut atorvastatin in half for 2 weeks. Abnormal chest x-ray, will completed CT of chest. Follow up 6 months.   Recent consult visits: 11/13/20 - Pulmonology ([facility]) - Pt presented for ILD follow up, abnormal CT imaging. No resp symptoms at this time. Complete full work up for ILD. Follow up with ILD specialist, January 02, 2021.   Objective:  Lab Results  Component Value Date   CREATININE 1.17 10/23/2020   BUN 22 10/23/2020   GFR 57.17 (L) 02/16/2020   GFRNONAA >60 09/15/2020   GFRAA >60 02/12/2019   NA 136 10/23/2020   K 4.8 10/23/2020   CALCIUM 10.3 (H) 10/23/2020   CO2 27 10/23/2020    Lab Results  Component Value Date/Time   HGBA1C 7.2 (H) 09/07/2020 07:38 AM    HGBA1C 7.4 (H) 02/16/2020 08:06 AM   GFR 57.17 (L) 02/16/2020 08:06 AM   GFR 61.22 02/18/2019 09:58 AM   MICROALBUR 1.7 10/07/2008 12:00 AM    Last diabetic Eye exam:  Lab Results  Component Value Date/Time   HMDIABEYEEXA Retinopathy (A) 06/17/2019 12:00 AM    Last diabetic Foot exam: 03/2020   Lab Results  Component Value Date   CHOL 142 02/16/2020   HDL 33.60 (L) 02/16/2020   LDLCALC 63 11/11/2017   LDLDIRECT 62.0 02/16/2020   TRIG 294.0 (H) 02/16/2020   CHOLHDL 4 02/16/2020    Hepatic Function Latest Ref Rng & Units 02/16/2020 02/18/2019 02/12/2018  Total Protein 6.0 - 8.3 g/dL 7.2 7.1 7.3  Albumin 3.5 - 5.2 g/dL 4.4 4.4 4.2  AST 0 - 37 U/L _0 ALT 0 - 53 U/L _1 Alk Phosphatase 39 - 117 U/L 61 77 59  Total Bilirubin 0.2 - 1.2 mg/dL 0.6 0.6 0.4  Bilirubin, Direct 0.0 - 0.3 mg/dL - - -    Lab Results  Component Value Date/Time   TSH 2.75 02/12/2018 01:01 PM   TSH 2.868 07/14/2012 04:33 PM    CBC Latest Ref Rng & Units 10/23/2020 09/15/2020 02/16/2020  WBC 3.4 - 10.8 x10E3/uL 9.7 10.2 9.7  Hemoglobin 13.0 - 17.7 g/dL 15.3 16.2 14.3  Hematocrit 37.5 - 51.0 % 45.2 47.6 41.6  Platelets 150 - 450 x10E3/uL 265 263 252.0    No results found for: VD25OH  Clinical ASCVD: Yes  The  10-year ASCVD risk score Mikey Bussing DC Brooke Bonito., et al., 2013) is: 48.1%   Values used to calculate the score:     Age: 31 years     Sex: Male     Is Non-Hispanic African American: No     Diabetic: Yes     Tobacco smoker: No     Systolic Blood Pressure: 528 mmHg     Is BP treated: Yes     HDL Cholesterol: 33.6 mg/dL     Total Cholesterol: 142 mg/dL    Depression screen [facility] 2/9 03/14/2020 02/18/2019 12/08/2017  Decreased Interest 0 0 0  Down, Depressed, Hopeless 0 0 0  PHQ - 2 Score 0 0 0  Altered sleeping 0 - -  Tired, decreased energy 0 - -  Change in appetite 0 - -  Feeling bad or failure about yourself  0 - -  Trouble concentrating 0 - -  Moving slowly or fidgety/restless 0 - -   Suicidal thoughts 0 - -  PHQ-9 Score 0 - -  Some recent data might be hidden     Social History   Tobacco Use  Smoking Status Former   Packs/day: 2.00   Years: 40.00   Pack years: 80.00   Types: Cigarettes   Start date: 1964   Quit date: 05/06/2004   Years since quitting: 16.6  Smokeless Tobacco Never   BP Readings from Last 3 Encounters:  12/18/20 133/74  11/13/20 (!) 168/80  10/25/20 138/67   Pulse Readings from Last 3 Encounters:  11/13/20 71  10/25/20 (!) 58  09/15/20 75   Wt Readings from Last 3 Encounters:  11/13/20 193 lb (87.5 kg)  10/25/20 190 lb (86.2 kg)  09/15/20 191 lb (86.6 kg)    Assessment/Interventions: Review of patient past medical history, allergies, medications, health status, including review of consultants reports, laboratory and other test data, was performed as part of comprehensive evaluation and provision of chronic care management services.   SDOH:  (Social Determinants of Health) assessments and interventions performed: Yes SDOH Interventions    Flowsheet Row Most Recent Value  SDOH Interventions   Financial Strain Interventions Intervention Not Indicated        CCM Care Plan  Allergies  Allergen Reactions   Aldactone [Spironolactone] Other (See Comments)    Hyperkalemia   Codeine Rash and Hives   Januvia [Sitagliptin] Other (See Comments)    Diarrhea and heart racing   Lisinopril     Possible cause of pancreatitis    Medications Reviewed Today     Reviewed by [name], [facility] (Pharmacist) on 12/18/20 at 0853  Med List Status: <None>   Medication Order Taking? Sig Documenting Provider Last Dose Status Informant  amLODipine (NORVASC) 10 MG tablet [id number] Yes TAKE 1 TABLET(10 MG) BY MOUTH DAILY Jerline Pain, MD Taking Active   aspirin EC 81 MG tablet [id number] Yes Take 81 mg by mouth daily. [provider] Taking Active Self  atorvastatin (LIPITOR) 20 MG tablet [id number] Yes Take 1 tablet (20 mg total) by  mouth daily. Tonia Ghent, MD Taking Active Self  carvedilol (COREG) 25 MG tablet [id number] Yes TAKE 1 TABLET BY MOUTH TWICE DAILY WITH MEALS Jerline Pain, MD Taking Active Self  Cholecalciferol (VITAMIN D PO) [id number] Yes Take 2,500 Units by mouth daily. [provider] Taking Active Self  Cyanocobalamin 2500 MCG CHEW [id number] Yes Chew 2,500 mcg by mouth daily. [provider] Taking Active Self  empagliflozin (JARDIANCE) 10 MG TABS tablet [id number]  Yes Take 1 tablet (10 mg total) by mouth daily before breakfast. Tonia Ghent, MD Taking Active Self  glipiZIDE (GLUCOTROL) 5 MG tablet [id number] Yes Take 1 tablet (5 mg total) by mouth 2 (two) times daily before a meal. Tonia Ghent, MD Taking Active Self  glucose blood (ACCU-CHEK AVIVA PLUS) test strip [id number] Yes USE TO CHECK BLOOD GLUCOSE UP TO 3 TIMES DAILY. Tonia Ghent, MD Taking Active Self  hydrALAZINE (APRESOLINE) 25 MG tablet [id number] Yes TAKE 1 TABLET(25 MG) BY MOUTH THREE TIMES DAILY Tonia Ghent, MD Taking Active Self  insulin glargine (LANTUS SOLOSTAR) 100 UNIT/ML Solostar Pen [id number] Yes INJECT 0.3 TO 0.35 MLS(30 TO 35 UNITS) INTO THE SKIN EVERY DAY  Patient taking differently: Inject 30 Units into the skin at bedtime.   Tonia Ghent, MD Taking Active Self  metFORMIN (GLUCOPHAGE) 500 MG tablet [id number] Yes TAKE 2 TABLETS BY MOUTH TWICE DAILY WITH FOOD Tonia Ghent, MD Taking Active Self  Multiple Vitamin (MULTIVITAMIN) tablet [id number] Yes Take 1 tablet by mouth daily. [provider] Taking Active Self  omeprazole (PRILOSEC) 40 MG capsule [id number] Yes TAKE 1 CAPSULE(40 MG) BY MOUTH TWICE DAILY  Patient taking differently: Take 40 mg by mouth daily.   Mauri Pole, MD Taking Active Self  sildenafil (REVATIO) 20 MG tablet [id number] Yes TAKE 3 TO 4 TABLETS BY MOUTH DAILY AS NEEDED  Patient taking differently: Take 60-80 mg by mouth daily as needed (ED).   Tonia Ghent, MD Taking Active Self            Patient Active Problem List   Diagnosis Date Noted   Abnormal chest x-ray 09/17/2020   Pain due to onychomycosis of toenails of both feet 16/11/3708   Chronic systolic heart failure ([place]) 06/08/2019   Biventricular ICD (implantable cardioverter-defibrillator) in place 06/08/2019   Esophageal obstruction due to food impaction    Esophageal stricture    Dysphagia 11/19/2017   CKD (chronic kidney disease), stage III ([place]) 11/10/2017   Pancreatitis 11/10/2017   Healthcare maintenance 11/22/2016   Tinnitus 05/23/2015   Medicare annual wellness visit, subsequent 10/20/2014   Advance care planning 10/20/2014   S/P cervical spinal fusion 06/09/2014   Stenosis of cervical spine with myelopathy ([place]) 12/27/2013   Preop cardiovascular exam 08/13/2013   Central cord syndrome ([place]) 06/24/2013   Encounter for fitting or adjustment of automatic implantable cardioverter-defibrillator 04/20/2013   Drug therapy    Status post abdominal aortic aneurysm repair 12/30/2012   HLD (hyperlipidemia) 09/16/2012   Ejection fraction < 50%    Complete heart block ([place]) 07/14/2012   SK (seborrheic keratosis) 02/12/2012   Shoulder pain 11/08/2011   CAD (coronary artery disease)    Ischemic cardiomyopathy    LBBB (left bundle branch block)    PVD (peripheral vascular disease) ([place]) 10/08/2010   ORGANIC IMPOTENCE 07/05/2010   Essential hypertension, benign 02/27/2010   Type 2 diabetes mellitus with vascular disease ([place]) 11/16/2009   AAA (abdominal aortic aneurysm) ([place]) 05/06/2009    Immunization History  Administered Date(s) Administered   Fluad Quad(high Dose 65+) 02/23/2020   Influenza Split 01/31/2011, 02/11/2012   Influenza Whole 02/27/2010   Influenza,inj,Quad PF,6+ Mos 03/18/2013, 03/25/2014, 01/19/2015, 02/21/2016, 02/27/2017, 02/12/2018, 02/18/2019   Influenza-Unspecified 03/03/2020   PFIZER(Purple Top)SARS-COV-2 Vaccination 05/23/2019,  06/12/2019   Pneumococcal Conjugate-13 10/18/2014   Pneumococcal Polysaccharide-23 11/14/2011   Td 07/05/2010    Conditions to be addressed/monitored:  Hypertension, Hyperlipidemia, and Diabetes  Care Plan : CCM  Pharmacy Care Plan  Updates made by [name], [facility] since 12/18/2020 12:00 AM     Problem: CHL AMB "PATIENT-SPECIFIC PROBLEM"   Priority: High     Long-Range Goal: Disease Management   Start Date: 06/13/2020  Expected End Date: 06/13/2021  This Visit's Progress: On track  Recent Progress: On track  Priority: High  Note:    Current Barriers:  None identified  Pharmacist Clinical Goal(s):  Patient will contact provider office for questions/concerns as evidenced notation of same in electronic health record through collaboration with PharmD and provider.   Interventions: 1:1 collaboration with Tonia Ghent, MD regarding development and update of comprehensive plan of care as evidenced by provider attestation and co-signature Inter-disciplinary care team collaboration (see longitudinal plan of care) Comprehensive medication review performed; medication list updated in electronic medical record  Hypertension (BP goal <140/90) -Controlled - per home readings -Current treatment: Amlodipine 10 mg - 1 tablet daily Carvedilol 30m - 1 tablet BID  Hydralazine 25 mg - 1 tablet TID -Medications previously tried: lisinopril (pancreatitis), aldactone (hyperkalemia); denies history of thiazide -Current home readings: weight 190 lbs, BP 133/74, 67, Oxygen 95% this morning -Checks BP about once a week, Omron arm cuff  -Reports long history of home readings much lower than in clinic - White coat syndrome. -Current exercise habits: treadmill 5 days per week and home weights  -Denies hypotensive/hypertensive symptoms -He denies difficulty remembering hydralazine TID. Affirms adherence to above and is knowledgeable about dose/timing. -Continue monitoring BP at home weekly,  document, and provide log at future appointments -Recommended to continue current medication  Hyperlipidemia/CAD: (LDL goal < 70) -Controlled - LDL < 70 -  Pt reports leg cramps improved. He has not had to reduce his atorvastatin dosing. He continues treadmill daily, free weights routinely. -Current treatment: Atorvastatin 20 mg - 1 tablet daily Aspirin 81 mg - 1 tablet daily -Medications previously tried: simvastatin, changed due to drug interaction -Reviewed cholesterol goals. Pt affirms daily adherence to statin and aspirin 81 mg. -Recommended to continue current medication  Diabetes (A1c goal <7%) -Controlled - per home BG readings. Although A1c was 7.2% his home readings are on low end and within goal. He tolerates Jardiance well. He is due for refill on Lantus from PAP. -Current medications: Lantus 100 units/mL - 30 units daily Metformin 5063m- 2 tablets BID with meals Glipizide 5 mg - 1 tablets BID before meals (dose decreased in past 6 months) Jardiance 10 mg - 1 tablet daily (started in past 6 months) -Medications previously tried: Not a candidate for GLP-1 due to history of pancreatitis with lisinopril -History: Reports started insulin 2019 due to A1c consistently above 8% and could not get it down  -Current home glucose readings - checks twice daily fasting glucose: 73, runs between 73-104, usually 90s; denies symptoms in 70s, but does feel bad in 60s. post prandial glucose: 2 hours after eats 105-130 Hypoglycemia - very rare, 1 every 6 months, in middle of night < 70 -Reports hypoglycemic symptoms rare. Denies hyperglycemic symptoms. -Current meal patterns: < 200 gm/carbs per day tries to keep under 50 gm at BF, 50 lunch, 100 supper, cookie before bedtime so it doesn't drop overnight - no updates/changes in diet since initial visit 06/2020. His weight is 190 lbs today 12/18/20. -Counseled to check feet daily and get yearly eye exams - Pt reports eye exam completed last week  (12/11/2020). He has cataract, mild dm retinopathy. No changes were made. -Recommended to continue current medication; Will  check with [facility] on Lantus refill status and provided my phone number for any refill concerns.   Neuropathy (Goal: Relieve symptoms) -No updates/changes 12/2020, did not discuss. -Current treatment  No pharmacotherapy -Medications previously tried: gabapentin - imbalance/dizziness  -Exercising regularly  -Recommend continue exercise  Patient Goals/Self-Care Activities Patient will:  - take medications as prescribed - check blood pressure weekly, document, and provide at future appointments - check blood glucose twice daily, document, and provide at future appointments  Follow Up Plan: Telephone follow up appointment with care management team member scheduled for: 12 months -CMA review of BP and BG logs in 6 months      Medication Assistance:  Lantus obtained through [place] medication assistance program.  Enrollment ends 05/05/2021. Patient was ineligible for Jardiance assistance in 2022.  Patient's preferred pharmacy is:  [id number] [address], [address] AT [address] [place] [address] Phone: [phone] Fax: [phone]  Pt endorses compliance. [facility] is working well. He does not uses pillbox, but has a good routine. Provided my direct # for Lantus refills in future.   We discussed: Current pharmacy is preferred with insurance plan and patient is satisfied with pharmacy services Patient decided to: Continue current medication management strategy  Follow Up:  Patient agrees to Care Plan and Follow-up.  [name], PharmD Clinical Pharmacist [place] [facility] at [facility] [phone]

## 2020-12-18 NOTE — Patient Instructions (Signed)
Dear Luke [name],  Below is a summary of the goals we discussed during our follow up appointment on December 18, 2020. Please contact me anytime with questions or concerns.   Visit Information  Patient Care Plan: CCM Pharmacy Care Plan     Problem Identified: CHL AMB "PATIENT-SPECIFIC PROBLEM"   Priority: High     Long-Range Goal: Disease Management   Start Date: 06/13/2020  Expected End Date: 06/13/2021  This Visit's Progress: On track  Recent Progress: On track  Priority: High  Note:    Current Barriers:  None identified  Pharmacist Clinical Goal(s):  Patient will contact provider office for questions/concerns as evidenced notation of same in electronic health record through collaboration with PharmD and provider.   Interventions: 1:1 collaboration with Tonia Ghent, MD regarding development and update of comprehensive plan of care as evidenced by provider attestation and co-signature Inter-disciplinary care team collaboration (see longitudinal plan of care) Comprehensive medication review performed; medication list updated in electronic medical record  Hypertension (BP goal <140/90) -Controlled - per home readings -Current treatment: Amlodipine 10 mg - 1 tablet daily Carvedilol '25mg'$  - 1 tablet BID  Hydralazine 25 mg - 1 tablet TID -Medications previously tried: lisinopril (pancreatitis), aldactone (hyperkalemia); denies history of thiazide -Current home readings: weight 190 lbs, BP 133/74, 67, Oxygen 95% this morning -Checks BP about once a week, Omron arm cuff  -Reports long history of home readings much lower than in clinic - White coat syndrome. -Current exercise habits: treadmill 5 days per week and home weights  -Denies hypotensive/hypertensive symptoms -He denies difficulty remembering hydralazine TID. Affirms adherence to above and is knowledgeable about dose/timing. -Continue monitoring BP at home weekly, document, and provide log at future  appointments -Recommended to continue current medication  Hyperlipidemia/CAD: (LDL goal < 70) -Controlled - LDL < 70 -  Pt reports leg cramps improved. He has not had to reduce his atorvastatin dosing. He continues treadmill daily, free weights routinely. -Current treatment: Atorvastatin 20 mg - 1 tablet daily Aspirin 81 mg - 1 tablet daily -Medications previously tried: simvastatin, changed due to drug interaction -Reviewed cholesterol goals. Pt affirms daily adherence to statin and aspirin 81 mg. -Recommended to continue current medication  Diabetes (A1c goal <7%) -Controlled - per home BG readings. Although A1c was 7.2% his home readings are on low end and within goal. He tolerates Jardiance well. He is due for refill on Lantus from PAP. -Current medications: Lantus 100 units/mL - 30 units daily Metformin '500mg'$  - 2 tablets BID with meals Glipizide 5 mg - 1 tablets BID before meals (dose decreased in past 6 months) Jardiance 10 mg - 1 tablet daily (started in past 6 months) -Medications previously tried: Not a candidate for GLP-1 due to history of pancreatitis with lisinopril -History: Reports started insulin 2019 due to A1c consistently above 8% and could not get it down  -Current home glucose readings - checks twice daily fasting glucose: 73, runs between 73-104, usually 90s; denies symptoms in 70s, but does feel bad in 60s. post prandial glucose: 2 hours after eats 105-130 Hypoglycemia - very rare, 1 every 6 months, in middle of night < 70 -Reports hypoglycemic symptoms rare. Denies hyperglycemic symptoms. -Current meal patterns: < 200 gm/carbs per day tries to keep under 50 gm at BF, 50 lunch, 100 supper, cookie before bedtime so it doesn't drop overnight - no updates/changes in diet since initial visit 06/2020. His weight is 190 lbs today 12/18/20. -Counseled to check feet daily and  get yearly eye exams - Pt reports eye exam completed last week (12/11/2020). He has cataract, mild dm  retinopathy. No changes were made. -Recommended to continue current medication; Will check with PAP on Lantus refill status and provided my phone number for any refill concerns.   Neuropathy (Goal: Relieve symptoms) -No updates/changes 12/2020, did not discuss. -Current treatment  No pharmacotherapy -Medications previously tried: gabapentin - imbalance/dizziness  -Exercising regularly  -Recommend continue exercise  Patient Goals/Self-Care Activities Patient will:  - take medications as prescribed - check blood pressure weekly, document, and provide at future appointments - check blood glucose twice daily, document, and provide at future appointments  Follow Up Plan: Telephone follow up appointment with care management team member scheduled for: 12 months -CMA review of BP and BG logs in 6 months      Patient verbalizes understanding of instructions provided today and agrees to view in [place].   [name], PharmD Clinical Pharmacist [place] [facility] at [facility] [phone]

## 2020-12-24 ENCOUNTER — Other Ambulatory Visit: Payer: Self-pay | Admitting: Family Medicine

## 2020-12-26 ENCOUNTER — Encounter: Payer: Medicare Other | Admitting: Internal Medicine

## 2020-12-29 NOTE — Progress Notes (Signed)
Cardiology Office Note:    Date:  01/01/2021   ID:  Luke [name], DOB 12-05-1945, MRN [id number]  PCP:  Tonia Ghent, MD   [facility] Providers Cardiologist:  None     Referring MD: Tonia Ghent, MD    History of Present Illness:    Luke [name] is a 75 y.o. male here for a follow-up of of ischemic cardiomyopathy left bundle branch block CRT ICD, Dr. Caryl Comes after intermittent complete heart block.  Goes by [name].  Former Dr. [name] pt.     Previous visit 11/07/20 Follow-up after ICD. Wound check appointment. Dermabond removed. Wound without redness or edema. Incision edges approximated, wound well healed. Normal device function. Thresholds, P wave sensing, and impedances consistent with implant measurements.   Device programmed at appropriate safety margin for chronic leads. Histogram distribution appropriate for patient and level of activity.   No mode switches or ventricular arrhythmias noted. Patient educated about wound care, arm mobility, lifting restrictions, shock plan. Patient is enrolled in remote monitoring, next scheduled check 01/24/21.  ROV with Dr. Caryl Comes 01/31/21.  Today, on Jardiance, currently $400 in the donut hole.  Urinating more.  Drinking 64 ounces a day.  Researched it on Dr. Essie Hart he states.  He was concerned.  We discussed its benefits and the heart failure population.  Agree with use.  He is exercising frequently.  No anginal symptoms no shortness of breath.  He has interstitial fibrosis on CT scan.  Dr. Valeta Harms has seen him in pulmonary.  Overall stable.  He denies having any exertional shortness of breath, chest pain, tightness, or pressure. He has no lightheadedness, LE edema, syncopal episodes, PND, or orthopnea.    Past Medical History:  Diagnosis Date   AAA (abdominal aortic aneurysm) ([place]) 2011   Per vascular surgery   Anxiety    no med. in use for anxiety but pt. speaks openly of his stress & anxious feelings regarding impending  surgery     Atrial fibrillation ([place])    Automatic implantable cardioverter-defibrillator in situ    CAD (coronary artery disease)    Presumed CAD with nuclear scan October 09, 2011,  large anteroseptal MI and inferior MI. Catheterization scheduled October 15, 2011   Cardiomyopathy [facility])    Nuclear, October 09, 2011, EF 30%, multiple focal wall motion abnormalities   Diabetes mellitus    type II   Drug therapy    Hyperkalemia with spironolactone   Ejection fraction < 50%    EF 30%, nuclear, October 09, 2011   Ejection fraction < 50%    EF previously 30%  //   EF 30-35%, echo, July 14, 2012, severe diffuse hypokinesis, PA pressure 43 mm mercury   Fall due to ice or snow Feb.  19, 2015   HLD (hyperlipidemia)    Hypertension    white coat HTN-- often elevated in office and controlled on outside checks.   ICD (implantable cardioverter-defibrillator) in place    CRT-D placed March, 2014 complete heart block and k dysfunction   LBBB (left bundle branch block)    LBBB on EKG October 11, 2011,  no prior EKG has been done   Low testosterone    Hx of   Myocardial infarction ([place])    Told in 2015-never knew-showed up on stress test for AAA;    Pacemaker    PAD (peripheral artery disease) ([place])    Pancreatitis     Past Surgical History:  Procedure Laterality Date   ABDOMINAL  AORTAGRAM N/A 10/28/2011   Procedure: ABDOMINAL [name];  Surgeon: Angelia Mould, MD;  Location: [facility] CATH LAB;  Service: Cardiovascular;  Laterality: N/A;   ABDOMINAL AORTIC ANEURYSM REPAIR     EVAR    BI-VENTRICULAR IMPLANTABLE CARDIOVERTER DEFIBRILLATOR N/A 07/16/2012   Procedure: BI-VENTRICULAR IMPLANTABLE CARDIOVERTER DEFIBRILLATOR  (CRT-D);  Surgeon: Deboraha Sprang, MD;  Location: [facility] CATH LAB;  Service: Cardiovascular;  Laterality: N/A;   BIV ICD GENERATOR CHANGEOUT N/A 10/25/2020   Procedure: BIV ICD GENERATOR CHANGEOUT;  Surgeon: Deboraha Sprang, MD;  Location: [place] CV LAB;  Service: Cardiovascular;  Laterality:  N/A;   CARDIAC CATHETERIZATION     COLONOSCOPY  03/23/2018; 2020   ESOPHAGOGASTRODUODENOSCOPY N/A 02/12/2019   Procedure: ESOPHAGOGASTRODUODENOSCOPY (EGD);  Surgeon: Jackquline Denmark, MD;  Location: [facility] ENDOSCOPY;  Service: Endoscopy;  Laterality: N/A;   ESOPHAGOGASTRODUODENOSCOPY (EGD) WITH PROPOFOL N/A 02/05/2018   Procedure: ESOPHAGOGASTRODUODENOSCOPY (EGD) WITH PROPOFOL;  Surgeon: Mauri Pole, MD;  Location: [facility] ENDOSCOPY;  Service: Endoscopy;  Laterality: N/A;   ESOPHAGOGASTRODUODENOSCOPY (EGD) WITH PROPOFOL N/A 09/27/2018   Procedure: ESOPHAGOGASTRODUODENOSCOPY (EGD) WITH PROPOFOL;  Surgeon: Carol Ada, MD;  Location: [name];  Service: Endoscopy;  Laterality: N/A;   FOREIGN BODY REMOVAL  09/27/2018   Procedure: FOREIGN BODY REMOVAL;  Surgeon: Carol Ada, MD;  Location: [facility] ENDOSCOPY;  Service: Endoscopy;;   FOREIGN BODY REMOVAL  02/12/2019   Procedure: FOREIGN BODY REMOVAL;  Surgeon: Jackquline Denmark, MD;  Location: [facility] ENDOSCOPY;  Service: Endoscopy;;   HERNIA REPAIR  Jan. 9, 2015   INSERTION OF MESH N/A 05/14/2013   Procedure: INSERTION OF MESH;  Surgeon: Adin Hector, MD;  Location: [place];  Service: General;  Laterality: N/A;   LOWER EXTREMITY ANGIOGRAM Bilateral 10/28/2011   Procedure: LOWER EXTREMITY ANGIOGRAM;  Surgeon: Angelia Mould, MD;  Location[facility] CATH LAB;  Service: Cardiovascular;  Laterality: Bilateral;   PACEMAKER INSERTION  07-16-12   pacemaker/defibrilator   POLYPECTOMY     POSTERIOR CERVICAL FUSION/FORAMINOTOMY N/A 06/09/2014   Procedure: Laminectomy - Cervical two-Cervcial four posterior cervical instrumented fusion Cervical two-cervical four;  Surgeon: Eustace Moore, MD;  Location: [place] NEURO ORS;  Service: Neurosurgery;  Laterality: N/A;  posterior    SAVORY DILATION N/A 02/05/2018   Procedure: SAVORY DILATION;  Surgeon: Mauri Pole, MD;  Location: [facility] ENDOSCOPY;  Service: Endoscopy;  Laterality: N/A;   TONSILLECTOMY     as a child    UMBILICAL  HERNIA REPAIR N/A 05/14/2013   Procedure: LAPAROSCOPIC exploration and repair of hernia in abdominal ;  Surgeon: Adin Hector, MD;  Location: [place];  Service: General;  Laterality: N/A;   UPPER GASTROINTESTINAL ENDOSCOPY  2020    Current Medications: Current Meds  Medication Sig   amLODipine (NORVASC) 10 MG tablet TAKE 1 TABLET(10 MG) BY MOUTH DAILY   aspirin EC 81 MG tablet Take 81 mg by mouth daily.   atorvastatin (LIPITOR) 20 MG tablet Take 1 tablet (20 mg total) by mouth daily.   carvedilol (COREG) 25 MG tablet TAKE 1 TABLET BY MOUTH TWICE DAILY WITH MEALS   Cholecalciferol (VITAMIN D PO) Take 2,500 Units by mouth daily.   Cyanocobalamin 2500 MCG CHEW Chew 2,500 mcg by mouth daily.   glipiZIDE (GLUCOTROL) 5 MG tablet Take 1 tablet (5 mg total) by mouth 2 (two) times daily before a meal.   glucose blood (ACCU-CHEK AVIVA PLUS) test strip USE TO CHECK BLOOD GLUCOSE UP TO 3 TIMES DAILY.   hydrALAZINE (APRESOLINE) 25 MG tablet TAKE 1  TABLET(25 MG) BY MOUTH THREE TIMES DAILY   insulin glargine (LANTUS SOLOSTAR) 100 UNIT/ML Solostar Pen INJECT 0.3 TO 0.35 MLS(30 TO 35 UNITS) INTO THE SKIN EVERY DAY   JARDIANCE 10 MG TABS tablet TAKE 1 TABLET(10 MG) BY MOUTH DAILY BEFORE AND BREAKFAST   metFORMIN (GLUCOPHAGE) 500 MG tablet TAKE 2 TABLETS BY MOUTH TWICE DAILY WITH FOOD   Multiple Vitamin (MULTIVITAMIN) tablet Take 1 tablet by mouth daily.   omeprazole (PRILOSEC) 40 MG capsule TAKE 1 CAPSULE(40 MG) BY MOUTH TWICE DAILY   sildenafil (REVATIO) 20 MG tablet TAKE 3 TO 4 TABLETS BY MOUTH DAILY AS NEEDED     Allergies:   Aldactone [spironolactone], Codeine, Januvia [sitagliptin], and Lisinopril   Social History   Socioeconomic History   Marital status: Married    Spouse name: Patti   Number of children: 1   Years of education: Not on file   Highest education level: Not on file  Occupational History   Occupation: retired  Tobacco Use   Smoking status: Former    Packs/day: 2.00    Years:  40.00    Pack years: 80.00    Types: Cigarettes    Start date: 1964    Quit date: 05/06/2004    Years since quitting: 16.6   Smokeless tobacco: Never  Vaping Use   Vaping Use: Never used  Substance and Sexual Activity   Alcohol use: Yes    Alcohol/week: 0.0 - 2.0 standard drinks    Comment: beer or wine  occassionally   Drug use: No   Sexual activity: Yes    Partners: Female  Other Topics Concern   Not on file  Social History Narrative   [place] vet, he has known service related agent orange exposure.    Retired   Chiropractor daily.     Social Determinants of Health   Financial Resource Strain: Low Risk    Difficulty of Paying Living Expenses: Not very hard  Food Insecurity: Not on file  Transportation Needs: Not on file  Physical Activity: Not on file  Stress: Not on file  Social Connections: Not on file     Family History: The patient's family history includes Diabetes in his sister; Heart attack in his sister; Heart disease in his sister; Hyperlipidemia in his mother and sister; Hypertension in his mother, sister, and son; Lung cancer in his father; Stroke in his mother. There is no history of Colon cancer, Prostate cancer, Esophageal cancer, Stomach cancer, Rectal cancer, or Colon polyps.  ROS:   Please see the history of present illness.    All other systems reviewed and are negative.  EKGs/Labs/Other Studies Reviewed:    The following studies were reviewed today:  CT Chest 10/03/20: Impression: 1. Spectrum of findings compatible with basilar predominant fibrotic interstitial lung disease without frank honeycombing, with mild progression at the lung bases since 03/25/2018 CT abdomen study, with more clear progression since 10/07/2011 CT abdomen study. Findings are categorized as probable UIP per consensus guidelines: Diagnosis of Idiopathic Pulmonary Fibrosis: An Official ATS/ERS/JRS/ALAT Clinical Practice Guideline. [place], Iss 5,  [id number], Jan 04 2017. 2. Scattered small solid pulmonary nodules, largest 5 mm. Follow-up noncontrast chest CT recommended in 12 months. This recommendation follows the consensus statement: Guidelines for Management of Incidental Pulmonary Nodules Detected on CT Images: From the Fleischner Society 2017; Radiology 2017; 284:228-243. 3. Three-vessel coronary atherosclerosis. 4. Chronic atrophy of pancreatic body and tail, progressive since 2019 CT abdomen study, without discrete  mass on this noncontrast CT, indeterminate but presumably due to progressive chronic pancreatitis. 5. Aortic Atherosclerosis (ICD10-I70.0) and Emphysema (ICD10-J43.9).  DG Chest 06/28/20: Findings: Emphysematous changes are noted throughout the lungs bilaterally. Widespread areas of interstitial prominence are noted, most evident throughout the mid to lower lungs, where there is also some ill-defined opacities, particularly in the left mid lung. No pleural effusions. No pneumothorax. No evidence of pulmonary edema. Heart size is normal. Upper mediastinal contours are within normal limits. Atherosclerotic calcifications in the thoracic aorta. Left-sided biventricular pacemaker/AICD in place with lead tips projecting over the expected location of the right atrium, right ventricle and left ventricle via the coronary sinus and coronary veins.  [place] 2013 - 2V CAD Left anterior descending (LAD): focal 70-80% proximal. Long 40% mid vessel. Right coronary artery (RCA): Diffusely diseased. 50-60% mid vessel, 70% distal, 70% at bifurcation of PDA/PLOM.    LV Ejection Fraction: 30%. He underwent catheterization which confirmed severe LV dysfunction with moderate to severe 2 vessel disease not thought to be appropriate for intervention.  Repeat echocardiogram in 2019 showed normal EF.    Recent Labs: 02/16/2020: ALT 18 10/23/2020: BUN 22; Creatinine, Ser 1.17; Hemoglobin 15.3; Platelets 265; Potassium 4.8; Sodium 136   Recent Lipid Panel    Component Value Date/Time   CHOL 142 02/16/2020 0806   TRIG 294.0 (H) 02/16/2020 0806   HDL 33.60 (L) 02/16/2020 0806   CHOLHDL 4 02/16/2020 0806   VLDL 58.8 (H) 02/16/2020 0806   LDLCALC 63 11/11/2017 0127   LDLDIRECT 62.0 02/16/2020 0806     Risk Assessment/Calculations:          Physical Exam:    VS:  BP (!) 140/50   Pulse 73   Ht '6\' 2"'$  (1.88 m)   Wt 192 lb 12.8 oz (87.5 kg)   SpO2 95%   BMI 24.75 kg/m     Wt Readings from Last 3 Encounters:  01/01/21 192 lb 12.8 oz (87.5 kg)  11/13/20 193 lb (87.5 kg)  10/25/20 190 lb (86.2 kg)     GEN:  Well nourished, well developed in no acute distress HEENT: Normal NECK: No JVD; No carotid bruits LYMPHATICS: No lymphadenopathy CARDIAC: RRR, no murmurs, rubs, gallops RESPIRATORY:  Clear to auscultation without rales, wheezing or rhonchi  ABDOMEN: Soft, non-tender, non-distended MUSCULOSKELETAL:  No edema; No deformity  SKIN: Warm and dry NEUROLOGIC:  Alert and oriented x 3 PSYCHIATRIC:  Normal affect     Chronic systolic heart failure (HCC) Agree with current medical management with carvedilol, Jardiance initiation.  We discussed at length.  He had concerns about this medication.  Continue to encourage use.  Great exercise.  No anginal symptoms.  Unable to utilize ACE inhibitor's because of prior pancreatitis.  He is not on spironolactone because of prior hyperkalemia.  Complete heart block (HCC) Has ICD/CRT.  Recent replacement June 2022, Dr. Caryl Comes.  Type 2 diabetes mellitus with vascular disease ([place]) Now on Jardiance.  Last hemoglobin A1c 7.2.  He is urinating more.  Typical side effect.  He is not having any nocturia however.  Discussed at length the benefits from a heart failure perspective.  Encouraged him to continue.  Cost is high currently in donut hole.  Dr. Damita Dunnings managing.  AAA (abdominal aortic aneurysm) ([place]) Prior AAA repair in 2013.  Doing well.  No abdominal symptoms.  HLD  (hyperlipidemia) Continuing with atorvastatin 20 mg.  No changes made.  No myalgias.  Last LDL 63.  Medication Adjustments/Labs and Tests Ordered: Current medicines are reviewed at length with the patient today.  Concerns regarding medicines are outlined above.  No orders of the defined types were placed in this encounter.  No orders of the defined types were placed in this encounter.   Patient Instructions  Medication Instructions:  The current medical regimen is effective;  continue present plan and medications.  *If you need a refill on your cardiac medications before your next appointment, please call your pharmacy*  Follow-Up: At [facility], you and your health needs are our priority.  As part of our continuing mission to provide you with exceptional heart care, we have created designated Provider Care Teams.  These Care Teams include your primary Cardiologist (physician) and Advanced Practice Providers (APPs -  Physician Assistants and Nurse Practitioners) who all work together to provide you with the care you need, when you need it.  We recommend signing up for the patient portal called "MyChart".  Sign up information is provided on this After Visit Summary.  MyChart is used to connect with patients for Virtual Visits (Telemedicine).  Patients are able to view lab/test results, encounter notes, upcoming appointments, etc.  Non-urgent messages can be sent to your provider as well.   To learn more about what you can do with MyChart, go to [email or web address].    Your next appointment:   1 year(s)  The format for your next appointment:   In Person  Provider:   Candee Furbish, MD   Thank you for choosing [facility]!!     Signed, Candee Furbish, MD  [place]

## 2021-01-01 ENCOUNTER — Other Ambulatory Visit: Payer: Self-pay

## 2021-01-01 ENCOUNTER — Ambulatory Visit (INDEPENDENT_AMBULATORY_CARE_PROVIDER_SITE_OTHER): Payer: Medicare Other | Admitting: Cardiology

## 2021-01-01 ENCOUNTER — Encounter: Payer: Self-pay | Admitting: Cardiology

## 2021-01-01 DIAGNOSIS — E7800 Pure hypercholesterolemia, unspecified: Secondary | ICD-10-CM | POA: Diagnosis not present

## 2021-01-01 DIAGNOSIS — E1159 Type 2 diabetes mellitus with other circulatory complications: Secondary | ICD-10-CM | POA: Diagnosis not present

## 2021-01-01 DIAGNOSIS — I5022 Chronic systolic (congestive) heart failure: Secondary | ICD-10-CM

## 2021-01-01 DIAGNOSIS — I714 Abdominal aortic aneurysm, without rupture, unspecified: Secondary | ICD-10-CM

## 2021-01-01 DIAGNOSIS — I442 Atrioventricular block, complete: Secondary | ICD-10-CM | POA: Diagnosis not present

## 2021-01-01 DIAGNOSIS — I255 Ischemic cardiomyopathy: Secondary | ICD-10-CM

## 2021-01-01 NOTE — Assessment & Plan Note (Addendum)
Agree with current medical management with carvedilol, Jardiance initiation.  We discussed at length.  He had concerns about this medication.  Continue to encourage use.  Great exercise.  No anginal symptoms.  Unable to utilize ACE inhibitor's because of prior pancreatitis.  He is not on spironolactone because of prior hyperkalemia.

## 2021-01-01 NOTE — Assessment & Plan Note (Signed)
Continuing with atorvastatin 20 mg.  No changes made.  No myalgias.  Last LDL 63.

## 2021-01-01 NOTE — Assessment & Plan Note (Signed)
Prior AAA repair in 2013.  Doing well.  No abdominal symptoms.

## 2021-01-01 NOTE — Assessment & Plan Note (Signed)
Now on Jardiance.  Last hemoglobin A1c 7.2.  He is urinating more.  Typical side effect.  He is not having any nocturia however.  Discussed at length the benefits from a heart failure perspective.  Encouraged him to continue.  Cost is high currently in donut hole.  Dr. Damita Dunnings managing.

## 2021-01-01 NOTE — Patient Instructions (Signed)
Medication Instructions:  The current medical regimen is effective;  continue present plan and medications.  *If you need a refill on your cardiac medications before your next appointment, please call your pharmacy*  Follow-Up: At [facility], you and your health needs are our priority.  As part of our continuing mission to provide you with exceptional heart care, we have created designated Provider Care Teams.  These Care Teams include your primary Cardiologist (physician) and Advanced Practice Providers (APPs -  Physician Assistants and Nurse Practitioners) who all work together to provide you with the care you need, when you need it.  We recommend signing up for the patient portal called "MyChart".  Sign up information is provided on this After Visit Summary.  MyChart is used to connect with patients for Virtual Visits (Telemedicine).  Patients are able to view lab/test results, encounter notes, upcoming appointments, etc.  Non-urgent messages can be sent to your provider as well.   To learn more about what you can do with MyChart, go to [email or web address].    Your next appointment:   1 year(s)  The format for your next appointment:   In Person  Provider:   Mark Skains, MD   Thank you for choosing [facility]!!    

## 2021-01-01 NOTE — Assessment & Plan Note (Signed)
Has ICD/CRT.  Recent replacement June 2022, Dr. Caryl Comes.

## 2021-01-02 ENCOUNTER — Encounter: Payer: Self-pay | Admitting: Internal Medicine

## 2021-01-02 ENCOUNTER — Ambulatory Visit (INDEPENDENT_AMBULATORY_CARE_PROVIDER_SITE_OTHER): Payer: Medicare Other | Admitting: Internal Medicine

## 2021-01-02 ENCOUNTER — Telehealth: Payer: Self-pay | Admitting: Family Medicine

## 2021-01-02 VITALS — BP 160/70 | HR 71 | Temp 97.6°F | Ht 74.0 in | Wt 193.4 lb

## 2021-01-02 DIAGNOSIS — J8410 Pulmonary fibrosis, unspecified: Secondary | ICD-10-CM

## 2021-01-02 DIAGNOSIS — I255 Ischemic cardiomyopathy: Secondary | ICD-10-CM | POA: Diagnosis not present

## 2021-01-02 DIAGNOSIS — J439 Emphysema, unspecified: Secondary | ICD-10-CM | POA: Diagnosis not present

## 2021-01-02 DIAGNOSIS — J84112 Idiopathic pulmonary fibrosis: Secondary | ICD-10-CM | POA: Diagnosis not present

## 2021-01-02 DIAGNOSIS — J432 Centrilobular emphysema: Secondary | ICD-10-CM | POA: Diagnosis not present

## 2021-01-02 MED ORDER — ALBUTEROL SULFATE HFA 108 (90 BASE) MCG/ACT IN AERS: 2.0000 | INHALATION_SPRAY | Freq: Four times a day (QID) | RESPIRATORY_TRACT | 6 refills | Status: DC | PRN

## 2021-01-02 MED ORDER — SPIRIVA RESPIMAT 1.25 MCG/ACT IN AERS: 2.0000 | INHALATION_SPRAY | Freq: Every day | RESPIRATORY_TRACT | 5 refills | Status: DC

## 2021-01-02 NOTE — Telephone Encounter (Signed)
LMTCB

## 2021-01-02 NOTE — Progress Notes (Signed)
OV 01/02/2021  Subjective:  Patient ID: Luke [name], male , DOB: [birth year] , age 75 y.o. , MRN: [id number] , ADDRESS: [place] [id number] PCP Tonia Ghent, MD Patient Care Team: Tonia Ghent, MD as PCP - General Angelia Mould, MD (Vascular Surgery) Arta Silence, MD (Gastroenterology) Michael Boston, MD (General Surgery) Eustace Moore, MD (Neurosurgery) Juluis Rainier (Optometry) Jerline Pain, MD (Cardiology) [name], [facility] as Pharmacist (Pharmacist)  This Provider for this visit: Treatment Team:  Attending Provider: Brand Males, MD    01/02/2021 -transfer of care to the interstitial lung disease center for Dr. Chase Caller.  Referral from Dr. June Leap Chief Complaint  Patient presents with   Consult    ILD consult per Dr. Valeta Harms. Pt states he has been doing okay since last visit and denies any complaints.     HPI Luke [name] 75 y.o. -referred by Dr. June Leap for interstitial lung disease evaluation   [place] Integrated Comprehensive ILD Questionnaire  Symptoms:  -Reports insidious onset of shortness of breath gradually.  It is the same since it started.  He is unclear how long he is headed but he had it for a few years.  There is no cough at all.  There is no clearing of the throat.  There is no fatigue.  His appetite is good no nausea no vomiting no diarrhea no anxiety no depression no chronic pain  SYMPTOM SCALE - ILD 01/02/2021   O2 use ra  Shortness of Breath 0 -> 5 scale with 5 being worst (score 6 If unable to do)  At rest 0  Simple tasks - showers, clothes change, eating, shaving 0  Household (dishes, doing bed, laundry) 0  Shopping 0  Walking level at own pace 0  Walking up Stairs 2  Total (30-36) Dyspnea Score 2  How bad is your cough? 0  How bad is your fatigue 00  How bad is nausea 0  How bad is vomiting?  0  How bad is diarrhea? 00  How bad is anxiety? 0  How bad is  depression 0       Past Medical History :   In 2009 he was diagnosed with systolic heart failure.  He believes etiology is ischemic based on a remote heart attack.  Has a history of diabetes for several years.  Has kidney disease not otherwise specified for several years.  His GFR in May 2022 was greater than 60 with a creatinine 1.23 mg percent.   His last echocardiogram was in 2019.  He sees cardiology Dr. Caryl Comes and Dr. Candee Furbish.  Overall he is deemed to be stable.  He had pacemaker check recently.  Denies any collagen vascular disease or vasculitis sleep apnea.  Denies any PE.  He has had COVID-vaccine but has not had COVID. ROS:   -Positive for fatigue.  Has some dysphagia for the last few days but otherwise okay.  No nausea no vomiting no heartburn no snoring no rash no ulcers.   FAMILY HISTORY of LUNG DISEASE: Denies   EXPOSURE HISTORY: Smokes cigarettes 20 1966 in 2012 30 cigarettes/day.  He did have some passive smoking.  Did smoke marijuana between 1968 and 1970.  Once a month.  No cocaine use no intravenous drug use   HOME and HOBBY DETAILS : [place] the last 25 years the age of the home is 69 years.  There is  no dampness.  No mildew no mold.  No humidifier use no CPAP use no nebulizer use.  No steam iron use no Jacuzzi use.  No misting [place] no pet birds or parakeets no pet gerbils.  No mold in the [facility] duct.  Last checked in 2009.  Does do gardening.  No birds at this no strong mats no water damage no Jacuzzi.   OCCUPATIONAL HISTORY (122 questions) : He was in [place] and got exposed to agent orange but otherwise detailed questioning is negative other than the fact he worked with some petroleum based cleaning agents.   PULMONARY TOXICITY HISTORY (27 items): He was on hydralazine in 2019.      Simple office walk 185 feet x  3 laps goal with forehead probe 01/02/2021    O2 used ra   Number laps completed 3   Comments about pace avg    Resting Pulse Ox/HR 97% and 71/min   Final Pulse Ox/HR 95% and 94/min   Desaturated </= 88% no   Desaturated <= 3% points no   Got Tachycardic >/= 90/min yes   Symptoms at end of test none   Miscellaneous comments x    HRCT May 2022 - personally visualzied -definite progression is combined emphysema with UIP features.  IMPRESSION: 1. Spectrum of findings compatible with basilar predominant fibrotic interstitial lung disease without frank honeycombing, with mild progression at the lung bases since 03/25/2018 CT abdomen study, with more clear progression since 10/07/2011 CT abdomen study. Findings are categorized as probable UIP per consensus guidelines: Diagnosis of Idiopathic Pulmonary Fibrosis: An Official ATS/ERS/JRS/ALAT Clinical Practice Guideline. [place], Iss 5, [id number], Jan 04 2017. 2. Scattered small solid pulmonary nodules, largest 5 mm. Follow-up noncontrast chest CT recommended in 12 months. This recommendation follows the consensus statement: Guidelines for Management of Incidental Pulmonary Nodules Detected on CT Images: From the Fleischner Society 2017; Radiology 2017; 284:228-243. 3. Three-vessel coronary atherosclerosis. 4. Chronic atrophy of pancreatic body and tail, progressive since 2019 CT abdomen study, without discrete mass on this noncontrast CT, indeterminate but presumably due to progressive chronic pancreatitis. 5. Aortic Atherosclerosis (ICD10-I70.0) and Emphysema (ICD10-J43.9).     Electronically Signed   By: Ilona Sorrel M.D.   On: 10/03/2020 16:53  SErology  Results for OLUWATOSIN, CARROLL" (MRN [id number]) as of 01/02/2021 14:44  Ref. Range 11/13/2020 16:09  Anti Nuclear Antibody (ANA) Latest Ref Range: NEGATIVE  NEGATIVE  Angiotensin-Converting Enzyme Latest Ref Range: 9 - 67 U/L 27  Cyclic Citrullin Peptide Ab Latest Units: UNITS <16  ds DNA Ab Latest Units: IU/mL <1  Myeloperoxidase Abs Latest Units: AI <1.0   Serine Protease 3 Latest Units: AI <1.0  RA Latex Turbid. Latest Ref Range: <14 IU/mL <14  SSA (Ro) (ENA) Antibody, IgG Latest Ref Range: <1.0 NEG AI <1.0 NEG  SSB (La) (ENA) Antibody, IgG Latest Ref Range: <1.0 NEG AI <1.0 NEG  Scleroderma (Scl-70) (ENA) Antibody, IgG Latest Ref Range: <1.0 NEG AI <1.0 NEG    eCHO arch 2019  Study Conclusions   - Left ventricle: The cavity size was normal. Wall thickness was    normal. Systolic function was normal. The estimated ejection    fraction was in the range of 55% to 60%. Wall motion was normal;    there were no regional wall motion abnormalities. Doppler    parameters are consistent with abnormal left ventricular    relaxation (grade 1 diastolic dysfunction).  - Mitral valve: There  was mild regurgitation.  - Pulmonary arteries: Systolic pressure was moderately increased.    PA peak pressure: 40 mm Hg (S).    has a past medical history of AAA (abdominal aortic aneurysm) ([place]) (2011), Anxiety, Atrial fibrillation ([place]), Automatic implantable cardioverter-defibrillator in situ, CAD (coronary artery disease), Cardiomyopathy ([place]), Diabetes mellitus, Drug therapy, Ejection fraction < 50%, Ejection fraction < 50%, Fall due to ice or snow (Feb.  19, 2015), HLD (hyperlipidemia), Hypertension, ICD (implantable cardioverter-defibrillator) in place, LBBB (left bundle branch block), Low testosterone, Myocardial infarction [facility]), Pacemaker, PAD (peripheral artery disease) ([place]), and Pancreatitis.   reports that he quit smoking about 16 years ago. His smoking use included cigarettes. He started smoking about 58 years ago. He has a 80.00 pack-year smoking history. He has never used smokeless tobacco.  Past Surgical History:  Procedure Laterality Date   ABDOMINAL AORTAGRAM N/A 10/28/2011   Procedure: ABDOMINAL [name];  Surgeon: Angelia Mould, MD;  Location: [facility] CATH LAB;  Service: Cardiovascular;  Laterality: N/A;   ABDOMINAL AORTIC ANEURYSM REPAIR      EVAR    BI-VENTRICULAR IMPLANTABLE CARDIOVERTER DEFIBRILLATOR N/A 07/16/2012   Procedure: BI-VENTRICULAR IMPLANTABLE CARDIOVERTER DEFIBRILLATOR  (CRT-D);  Surgeon: Deboraha Sprang, MD;  Location: [facility] CATH LAB;  Service: Cardiovascular;  Laterality: N/A;   BIV ICD GENERATOR CHANGEOUT N/A 10/25/2020   Procedure: BIV ICD GENERATOR CHANGEOUT;  Surgeon: Deboraha Sprang, MD;  Location: [place] CV LAB;  Service: Cardiovascular;  Laterality: N/A;   CARDIAC CATHETERIZATION     COLONOSCOPY  03/23/2018; 2020   ESOPHAGOGASTRODUODENOSCOPY N/A 02/12/2019   Procedure: ESOPHAGOGASTRODUODENOSCOPY (EGD);  Surgeon: Jackquline Denmark, MD;  Location: [facility] ENDOSCOPY;  Service: Endoscopy;  Laterality: N/A;   ESOPHAGOGASTRODUODENOSCOPY (EGD) WITH PROPOFOL N/A 02/05/2018   Procedure: ESOPHAGOGASTRODUODENOSCOPY (EGD) WITH PROPOFOL;  Surgeon: Mauri Pole, MD;  Location: [facility] ENDOSCOPY;  Service: Endoscopy;  Laterality: N/A;   ESOPHAGOGASTRODUODENOSCOPY (EGD) WITH PROPOFOL N/A 09/27/2018   Procedure: ESOPHAGOGASTRODUODENOSCOPY (EGD) WITH PROPOFOL;  Surgeon: Carol Ada, MD;  Location: [place];  Service: Endoscopy;  Laterality: N/A;   FOREIGN BODY REMOVAL  09/27/2018   Procedure: FOREIGN BODY REMOVAL;  Surgeon: Carol Ada, MD;  Location: [facility] ENDOSCOPY;  Service: Endoscopy;;   FOREIGN BODY REMOVAL  02/12/2019   Procedure: FOREIGN BODY REMOVAL;  Surgeon: Jackquline Denmark, MD;  Location: [facility] ENDOSCOPY;  Service: Endoscopy;;   HERNIA REPAIR  Jan. 9, 2015   INSERTION OF MESH N/A 05/14/2013   Procedure: INSERTION OF MESH;  Surgeon: Adin Hector, MD;  Location: [place];  Service: General;  Laterality: N/A;   LOWER EXTREMITY ANGIOGRAM Bilateral 10/28/2011   Procedure: LOWER EXTREMITY ANGIOGRAM;  Surgeon: Angelia Mould, MD;  Location[facility] CATH LAB;  Service: Cardiovascular;  Laterality: Bilateral;   PACEMAKER INSERTION  07-16-12   pacemaker/defibrilator   POLYPECTOMY     POSTERIOR CERVICAL FUSION/FORAMINOTOMY N/A  06/09/2014   Procedure: Laminectomy - Cervical two-Cervcial four posterior cervical instrumented fusion Cervical two-cervical four;  Surgeon: Eustace Moore, MD;  Location: [place] NEURO ORS;  Service: Neurosurgery;  Laterality: N/A;  posterior    SAVORY DILATION N/A 02/05/2018   Procedure: SAVORY DILATION;  Surgeon: Mauri Pole, MD;  Location: [facility] ENDOSCOPY;  Service: Endoscopy;  Laterality: N/A;   TONSILLECTOMY     as a child    UMBILICAL HERNIA REPAIR N/A 05/14/2013   Procedure: LAPAROSCOPIC exploration and repair of hernia in abdominal ;  Surgeon: Adin Hector, MD;  Location: [place];  Service: General;  Laterality: N/A;   UPPER  GASTROINTESTINAL ENDOSCOPY  2020    Allergies  Allergen Reactions   Aldactone [Spironolactone] Other (See Comments)    Hyperkalemia   Codeine Rash and Hives   Januvia [Sitagliptin] Other (See Comments)    Diarrhea and heart racing   Lisinopril     Possible cause of pancreatitis    Immunization History  Administered Date(s) Administered   Fluad Quad(high Dose 65+) 02/23/2020   Influenza Split 01/31/2011, 02/11/2012   Influenza Whole 02/27/2010   Influenza,inj,Quad PF,6+ Mos 03/18/2013, 03/25/2014, 01/19/2015, 02/21/2016, 02/27/2017, 02/12/2018, 02/18/2019   Influenza-Unspecified 03/03/2020   PFIZER(Purple Top)SARS-COV-2 Vaccination 05/23/2019, 06/12/2019   Pneumococcal Conjugate-13 10/18/2014   Pneumococcal Polysaccharide-23 11/14/2011   Td 07/05/2010    Family History  Problem Relation Age of Onset   Hypertension Mother    Stroke Mother    Hyperlipidemia Mother    Diabetes Sister    Heart disease Sister        Before age 2   Hypertension Sister    Hyperlipidemia Sister    Heart attack Sister    Lung cancer Father    Hypertension Son    Colon cancer Neg Hx    Prostate cancer Neg Hx    Esophageal cancer Neg Hx    Stomach cancer Neg Hx    Rectal cancer Neg Hx    Colon polyps Neg Hx      Current Outpatient Medications:    albuterol  (VENTOLIN HFA) 108 (90 Base) MCG/ACT inhaler, Inhale 2 puffs into the lungs every 6 (six) hours as needed for wheezing or shortness of breath., Disp: 8 g, Rfl: 6   amLODipine (NORVASC) 10 MG tablet, TAKE 1 TABLET(10 MG) BY MOUTH DAILY, Disp: 90 tablet, Rfl: 0   aspirin EC 81 MG tablet, Take 81 mg by mouth daily., Disp: , Rfl:    atorvastatin (LIPITOR) 20 MG tablet, Take 1 tablet (20 mg total) by mouth daily., Disp: 90 tablet, Rfl: 3   carvedilol (COREG) 25 MG tablet, TAKE 1 TABLET BY MOUTH TWICE DAILY WITH MEALS, Disp: 180 tablet, Rfl: 3   Cholecalciferol (VITAMIN D PO), Take 2,500 Units by mouth daily., Disp: , Rfl:    Cyanocobalamin 2500 MCG CHEW, Chew 2,500 mcg by mouth daily., Disp: , Rfl:    glipiZIDE (GLUCOTROL) 5 MG tablet, Take 1 tablet (5 mg total) by mouth 2 (two) times daily before a meal., Disp: 180 tablet, Rfl: 1   glucose blood (ACCU-CHEK AVIVA PLUS) test strip, USE TO CHECK BLOOD GLUCOSE UP TO 3 TIMES DAILY., Disp: , Rfl:    hydrALAZINE (APRESOLINE) 25 MG tablet, TAKE 1 TABLET(25 MG) BY MOUTH THREE TIMES DAILY, Disp: 90 tablet, Rfl: 1   insulin glargine (LANTUS SOLOSTAR) 100 UNIT/ML Solostar Pen, INJECT 0.3 TO 0.35 MLS(30 TO 35 UNITS) INTO THE SKIN EVERY DAY, Disp: 15 mL, Rfl: 3   JARDIANCE 10 MG TABS tablet, TAKE 1 TABLET(10 MG) BY MOUTH DAILY BEFORE AND BREAKFAST, Disp: 90 tablet, Rfl: 1   metFORMIN (GLUCOPHAGE) 500 MG tablet, TAKE 2 TABLETS BY MOUTH TWICE DAILY WITH FOOD, Disp: 360 tablet, Rfl: 2   Multiple Vitamin (MULTIVITAMIN) tablet, Take 1 tablet by mouth daily., Disp: , Rfl:    omeprazole (PRILOSEC) 40 MG capsule, TAKE 1 CAPSULE(40 MG) BY MOUTH TWICE DAILY, Disp: 180 capsule, Rfl: 0   sildenafil (REVATIO) 20 MG tablet, TAKE 3 TO 4 TABLETS BY MOUTH DAILY AS NEEDED, Disp: 50 tablet, Rfl: 11   Tiotropium Bromide Monohydrate (SPIRIVA RESPIMAT) 1.25 MCG/ACT AERS, Inhale 2 puffs into the lungs  daily., Disp: 4 g, Rfl: 5      Objective:   Vitals:   01/02/21 1426  BP: (!)  160/70  Pulse: 71  Temp: 97.6 F (36.4 C)  TempSrc: Oral  SpO2: 97%  Weight: 193 lb 6.4 oz (87.7 kg)  Height: '6\' 2"'$  (1.88 m)    Estimated body mass index is 24.83 kg/m as calculated from the following:   Height as of this encounter: '6\' 2"'$  (1.88 m).   Weight as of this encounter: 193 lb 6.4 oz (87.7 kg).  '@WEIGHTCHANGE'$ @  [employer]   01/02/21 1426  Weight: 193 lb 6.4 oz (87.7 kg)     Physical Exam  General Appearance:    Alert, cooperative, no distress, appears stated age - yes , Deconditioned looking - no , OBESE  - no, Sitting on Wheelchair -  no  Head:    Normocephalic, without obvious abnormality, atraumatic  Eyes:    PERRL, conjunctiva/corneas clear,  Ears:    Normal TM's and external ear canals, both ears  Nose:   Nares normal, septum midline, mucosa normal, no drainage    or sinus tenderness. OXYGEN ON  - no . Patient is @ ra   Throat:   Lips, mucosa, and tongue normal; teeth and gums normal. Cyanosis on lips - no  Neck:   Supple, symmetrical, trachea midline, no adenopathy;    thyroid:  no enlargement/tenderness/nodules; no carotid   bruit or JVD  Back:     Symmetric, no curvature, ROM normal, no CVA tenderness  Lungs:     Distress - no , Wheeze no, Barrell Chest - no, Purse lip breathing - no, Crackles -left greater than right base  Chest Wall:    No tenderness or deformity.    Heart:    Regular rate and rhythm, S1 and S2 normal, no rub   or gallop, Murmur - no  Breast Exam:    NOT DONE  Abdomen:     Soft, non-tender, bowel sounds active all four quadrants,    no masses, no organomegaly. Visceral obesity - no  Genitalia:   NOT DONE  Rectal:   NOT DONE  Extremities:   Extremities - normal, Has Cane - no, Clubbing - no, Edema - no  Pulses:   2+ and symmetric all extremities  Skin:   Stigmata of Connective Tissue Disease - no  Lymph nodes:   Cervical, supraclavicular, and axillary nodes normal  Psychiatric:  Neurologic:   Pleasant - yes, Anxious - no, Flat  affect - no  CAm-ICU - neg, Alert and Oriented x 3 - yes, Moves all 4s - yes, Speech - normal, Cognition - intact        Assessment:       ICD-10-CM   1. IPF (idiopathic pulmonary fibrosis) (HCC)  J84.112 Hepatic function panel    Hepatic function panel    2. Centrilobular emphysema ([place])  J43.2     3. 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HCC)  J43.9    J84.10      IPF diagnosis based on age greater than 72, male, Caucasian ethnicity, combined emphysema with ILD, probable UIP versus definitive UIP, negative serology and progression.  Detailed discussion on prognosis and future course.  Discussed antifibrotic nintedanib given his ischemic heart disease we will do pirfenidone.  If pirfenidone fails then will do nintedanib.     Plan:     Patient Instructions     ICD-10-CM   1. IPF (idiopathic pulmonary fibrosis) ([place])  [id number]  Hepatic function panel    Hepatic function panel    2. Centrilobular emphysema ([place])  J43.2     3. 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[place])  J43.9    J84.10      You have emphysema -  - for this start spiriva respimat 2 puff daiy with albuterol prn - check alpha 1 next vist  Re IPF - Course   - progressive disease in almost all patients (> 90%); typically few to several year progression   - super unlukcy 10% - progress to death in a year   - super lucky < 10% - stability > 5 years or even rarely 10 years   -unpredictable in each individual - Rx:  anti-fibrotics + since 2014 but they can only slow disease down; preventative. Not Rx symptoms  - they have side effects including intolerance is < 1/3rd patients  - most 55-65% patients tolerate them well  - further details below - Other pillars of management  - Symptoms - very mild for you and not an issue  - O2 -you do not need it now  - Rehab - definitely helps symptoms and conditioning; can discuss next visit  - Transplant - might not be an option for you ; discuss in future  -  Pulmonary Trials: discuss in future  - Patient Support Group    - [email or web address]   -  [email or web address]   Anti-fibrotic Drugs Both drugs OFEV and Esbriet only slow down progression, 1 out of 6 patients  - this means extension in quality of life but no difference in symptoms  - no study directly compares the 2 drugs but efficacy roughly equal at 1 year time point  Recoemend - ESBRIET  - 3 pill three times daily, slow titration.  - Need to wean sunscreen  - Some chance of nausea and anorexia with small chance for diarrhea  - no known heart attack risk - no known bleeding risk,   - need monthly blood work for 6 months - monitor liver function - possible mortality benefit in pooled analysis  - larger world wide experience  - refer pharmacist for counseling - check LFT 01/02/2021   Followup - refer Devki our pharmacist for esbriet counseling   - return in 6 weeks - telephone visit or virtual visit fine  ( Level 05 visit:  New 60-74 min   in  visit type: on-site physical face to visit  in total care time and counseling or/and coordination of care by this undersigned MD - Dr Brand Males. This includes one or more of the following on this same day 01/02/2021: pre-charting, chart review, note writing, documentation discussion of test results, diagnostic or treatment recommendations, prognosis, risks and benefits of management options, instructions, education, compliance or risk-factor reduction. It excludes time spent by the [place] or office staff in the care of the patient. Actual time 48 min)   SIGNATURE    Dr. Brand Males, M.D., F.C.C.P,  Pulmonary and Critical Care Medicine Staff Physician, [place] Director - Interstitial Lung Disease  Program  Pulmonary [place] at [place], [place], [address]  Pager: [phone], If no answer or between  15:00h - 7:00h: call [phone] Telephone: [phone]  5:28 PM 01/02/2021

## 2021-01-02 NOTE — Patient Instructions (Addendum)
ICD-10-CM   1. IPF (idiopathic pulmonary fibrosis) (HCC)  J84.112 Hepatic function panel    Hepatic function panel    2. Centrilobular emphysema ([place])  J43.2     3. 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[place])  J43.9    J84.10      You have emphysema -  - for this start spiriva respimat 2 puff daiy with albuterol prn - check alpha 1 next vist  Re IPF - Course   - progressive disease in almost all patients (> 90%); typically few to several year progression   - super unlukcy 10% - progress to death in a year   - super lucky < 10% - stability > 5 years or even rarely 10 years   -unpredictable in each individual - Rx:  anti-fibrotics + since 2014 but they can only slow disease down; preventative. Not Rx symptoms  - they have side effects including intolerance is < 1/3rd patients  - most 55-65% patients tolerate them well  - further details below - Other pillars of management  - Symptoms - very mild for you and not an issue  - O2 -you do not need it now  - Rehab - definitely helps symptoms and conditioning; can discuss next visit  - Transplant - might not be an option for you ; discuss in future  - Pulmonary Trials: discuss in future  - Patient Support Group    - [email or web address]   -  [email or web address]   Anti-fibrotic Drugs Both drugs OFEV and Esbriet only slow down progression, 1 out of 6 patients  - this means extension in quality of life but no difference in symptoms  - no study directly compares the 2 drugs but efficacy roughly equal at 1 year time point  Recoemend - ESBRIET  - 3 pill three times daily, slow titration.  - Need to wean sunscreen  - Some chance of nausea and anorexia with small chance for diarrhea  - no known heart attack risk - no known bleeding risk,   - need monthly blood work for 6 months - monitor liver  function - possible mortality benefit in pooled analysis  - larger world wide experience  - refer pharmacist for counseling - check LFT 01/02/2021   Followup - refer Devki our pharmacist for esbriet counseling   - return in 6 weeks - telephone visit or virtual visit fine

## 2021-01-02 NOTE — Telephone Encounter (Signed)
Spoke with patient and he is fine with getting any assistance he can get with the Jardiance. Patient states he had already tried talking with Sharyn Lull before for assistance but it was denied with his income. Patient is in the donut hole right now and its costing him $400 a month. I looked at goodrx while I was on the phone with him and even on there it is over $500. Not sure what other options we can offer him for that medication.

## 2021-01-02 NOTE — Telephone Encounter (Signed)
Please call patient.  I saw his cardiology note.  See if he wants me to check with pharmacy staff here about potentially getting him help paying for Jardiance.  I do not know how much help we can get him but we may be able to get him some.  Thanks.

## 2021-01-03 LAB — HEPATIC FUNCTION PANEL
ALT: 16 U/L (ref 0–53)
AST: 17 U/L (ref 0–37)
Albumin: 4.3 g/dL (ref 3.5–5.2)
Alkaline Phosphatase: 71 U/L (ref 39–117)
Bilirubin, Direct: 0.1 mg/dL (ref 0.0–0.3)
Total Bilirubin: 0.6 mg/dL (ref 0.2–1.2)
Total Protein: 7.4 g/dL (ref 6.0–8.3)

## 2021-01-03 NOTE — Telephone Encounter (Signed)
I will ask for pharmacy input on this.    Sharyn Lull- does the patient being in the doughnut hole affect his situation re: assistance in any way?  I greatly appreciate your help.

## 2021-01-03 NOTE — Telephone Encounter (Signed)
Attempted to reach patient to discuss options. Unfortunately not eligible for coupons since he has Medicare. If his income has changed since May 2022 or was reported incorrectly he can submit proof of income to [facility] for [place] assistance. Income limit is 250 % [employer] ($45,750/annually for family of 2). Alternatively, [place] income limit is a littler higher (300% [employer]PL, $54,930/annually for family of 2). If he meets this criteria, could switch to [place].  Will await return call.  [name], PharmD Clinical Pharmacist [facility] at [facility] [phone]

## 2021-01-09 ENCOUNTER — Telehealth: Payer: Self-pay

## 2021-01-09 NOTE — Telephone Encounter (Signed)
Contacted patient. He will contact [employer] to provide updated income information for Jardiance PAP (provided phone [place] Patient Assistance Program: [phone]). His gross income is actually within guidelines.   [name], PharmD Clinical Pharmacist [facility] at [facility] [phone]

## 2021-01-09 NOTE — Telephone Encounter (Signed)
See telephone note 01/02/21. Followed up with patient today. He will call program to provide updated income information. (Jardiance 10 mg - [employer])  [name], PharmD Clinical Pharmacist [facility] at [facility] [phone]

## 2021-01-11 ENCOUNTER — Other Ambulatory Visit: Payer: Self-pay | Admitting: Gastroenterology

## 2021-01-24 ENCOUNTER — Ambulatory Visit (INDEPENDENT_AMBULATORY_CARE_PROVIDER_SITE_OTHER): Payer: Medicare Other

## 2021-01-24 DIAGNOSIS — I442 Atrioventricular block, complete: Secondary | ICD-10-CM

## 2021-01-24 LAB — CUP PACEART REMOTE DEVICE CHECK
Battery Remaining Longevity: 90 mo
Battery Remaining Percentage: 95 %
Battery Voltage: 3.02 V
Brady Statistic AP VP Percent: 18 %
Brady Statistic AP VS Percent: 1 %
Brady Statistic AS VP Percent: 82 %
Brady Statistic AS VS Percent: 1 %
Brady Statistic RA Percent Paced: 18 %
Date Time Interrogation Session: 20220921022517
HighPow Impedance: 79 Ohm
Implantable Lead Implant Date: 20140313
Implantable Lead Implant Date: 20140313
Implantable Lead Implant Date: 20140313
Implantable Lead Location: 753858
Implantable Lead Location: 753859
Implantable Lead Location: 753860
Implantable Pulse Generator Implant Date: 20220622
Lead Channel Impedance Value: 390 Ohm
Lead Channel Impedance Value: 450 Ohm
Lead Channel Impedance Value: 840 Ohm
Lead Channel Pacing Threshold Amplitude: 0.75 V
Lead Channel Pacing Threshold Amplitude: 0.75 V
Lead Channel Pacing Threshold Amplitude: 1.5 V
Lead Channel Pacing Threshold Pulse Width: 0.5 ms
Lead Channel Pacing Threshold Pulse Width: 0.5 ms
Lead Channel Pacing Threshold Pulse Width: 0.6 ms
Lead Channel Sensing Intrinsic Amplitude: 3 mV
Lead Channel Setting Pacing Amplitude: 1.75 V
Lead Channel Setting Pacing Amplitude: 2.5 V
Lead Channel Setting Pacing Amplitude: 2.5 V
Lead Channel Setting Pacing Pulse Width: 0.5 ms
Lead Channel Setting Pacing Pulse Width: 0.6 ms
Lead Channel Setting Sensing Sensitivity: 0.5 mV
Pulse Gen Serial Number: 810030647

## 2021-01-25 ENCOUNTER — Other Ambulatory Visit: Payer: Self-pay | Admitting: Family Medicine

## 2021-01-26 ENCOUNTER — Telehealth: Payer: Self-pay

## 2021-01-26 NOTE — Chronic Care Management (AMB) (Addendum)
Chronic Care Management Pharmacy Assistant   Name: Luke [name]  MRN: [id number] DOB: 12-22-1945  Reason for Encounter: HTN review   Recent office visits:  None since last CCM contact  Recent consult visits:  01/02/21-Pulmonology-Patient presented for consult for interstitial lung disease evaluation-start spiriva respimat 2 puff daiy with albuterol prn. Recommend - ESBRIET  - 3 pill three times daily, slow titration. Follow up 6 weeks 01/01/21-Cardiology- Patient presented for follow up of ischemic cardiomyopathy. No medication changes follow up 1 year  Hospital visits:  None in previous 6 months  Medications: Outpatient Encounter Medications as of 01/26/2021  Medication Sig   albuterol (VENTOLIN HFA) 108 (90 Base) MCG/ACT inhaler Inhale 2 puffs into the lungs every 6 (six) hours as needed for wheezing or shortness of breath.   amLODipine (NORVASC) 10 MG tablet TAKE 1 TABLET(10 MG) BY MOUTH DAILY   aspirin EC 81 MG tablet Take 81 mg by mouth daily.   atorvastatin (LIPITOR) 20 MG tablet Take 1 tablet (20 mg total) by mouth daily.   carvedilol (COREG) 25 MG tablet TAKE 1 TABLET BY MOUTH TWICE DAILY WITH MEALS   Cholecalciferol (VITAMIN D PO) Take 2,500 Units by mouth daily.   Cyanocobalamin 2500 MCG CHEW Chew 2,500 mcg by mouth daily.   glipiZIDE (GLUCOTROL) 5 MG tablet Take 1 tablet (5 mg total) by mouth 2 (two) times daily before a meal.   glucose blood (ACCU-CHEK AVIVA PLUS) test strip USE TO CHECK BLOOD GLUCOSE UP TO TWICE DAILY.   hydrALAZINE (APRESOLINE) 25 MG tablet TAKE 1 TABLET(25 MG) BY MOUTH THREE TIMES DAILY   insulin glargine (LANTUS SOLOSTAR) 100 UNIT/ML Solostar Pen INJECT 0.3 TO 0.35 MLS(30 TO 35 UNITS) INTO THE SKIN EVERY DAY   JARDIANCE 10 MG TABS tablet TAKE 1 TABLET(10 MG) BY MOUTH DAILY BEFORE AND BREAKFAST   metFORMIN (GLUCOPHAGE) 500 MG tablet TAKE 2 TABLETS BY MOUTH TWICE DAILY WITH FOOD   Multiple Vitamin (MULTIVITAMIN) tablet Take 1 tablet by mouth daily.    omeprazole (PRILOSEC) 40 MG capsule TAKE 1 CAPSULE(40 MG) BY MOUTH TWICE DAILY   sildenafil (REVATIO) 20 MG tablet TAKE 3 TO 4 TABLETS BY MOUTH DAILY AS NEEDED   Tiotropium Bromide Monohydrate (SPIRIVA RESPIMAT) 1.25 MCG/ACT AERS Inhale 2 puffs into the lungs daily.   No facility-administered encounter medications on file as of 01/26/2021.    Recent Office Vitals: BP Readings from Last 3 Encounters:  01/02/21 (!) 160/70  01/01/21 (!) 140/50  12/18/20 133/74   Pulse Readings from Last 3 Encounters:  01/02/21 71  01/01/21 73  11/13/20 71    Wt Readings from Last 3 Encounters:  01/02/21 193 lb 6.4 oz (87.7 kg)  01/01/21 192 lb 12.8 oz (87.5 kg)  11/13/20 193 lb (87.5 kg)     Kidney Function Lab Results  Component Value Date/Time   CREATININE 1.17 10/23/2020 10:57 AM   CREATININE 1.23 09/15/2020 10:27 PM   CREATININE 1.01 06/29/2012 09:08 AM   CREATININE 1.06 09/25/2011 12:57 PM   GFR 57.17 (L) 02/16/2020 08:06 AM   GFRNONAA >60 09/15/2020 10:27 PM   GFRAA >60 02/12/2019 09:56 AM    BMP Latest Ref Rng & Units 10/23/2020 09/15/2020 02/16/2020  Glucose 65 - 99 mg/dL 149(H) 126(H) 124(H)  BUN 8 - 27 mg/dL 22 23 19   Creatinine 0.76 - 1.27 mg/dL 1.17 1.23 1.24  BUN/Creat Ratio 10 - 24 19 - -  Sodium 134 - 144 mmol/L 136 137 139  Potassium 3.5 - 5.2  mmol/L 4.8 4.2 4.9  Chloride 96 - 106 mmol/L 101 104 101  CO2 20 - 29 mmol/L 27 24 30   Calcium 8.6 - 10.2 mg/dL 10.3(H) 10.0 9.9     Recent Office Vitals: BP Readings from Last 3 Encounters:  01/02/21 (!) 160/70  01/01/21 (!) 140/50  12/18/20 133/74   Pulse Readings from Last 3 Encounters:  01/02/21 71  01/01/21 73  11/13/20 71    Wt Readings from Last 3 Encounters:  01/02/21 193 lb 6.4 oz (87.7 kg)  01/01/21 192 lb 12.8 oz (87.5 kg)  11/13/20 193 lb (87.5 kg)     Kidney Function Lab Results  Component Value Date/Time   CREATININE 1.17 10/23/2020 10:57 AM   CREATININE 1.23 09/15/2020 10:27 PM   CREATININE 1.01  06/29/2012 09:08 AM   CREATININE 1.06 09/25/2011 12:57 PM   GFR 57.17 (L) 02/16/2020 08:06 AM   GFRNONAA >60 09/15/2020 10:27 PM   GFRAA >60 02/12/2019 09:56 AM    BMP Latest Ref Rng & Units 10/23/2020 09/15/2020 02/16/2020  Glucose 65 - 99 mg/dL 149(H) 126(H) 124(H)  BUN 8 - 27 mg/dL 22 23 19   Creatinine 0.76 - 1.27 mg/dL 1.17 1.23 1.24  BUN/Creat Ratio 10 - 24 19 - -  Sodium 134 - 144 mmol/L 136 137 139  Potassium 3.5 - 5.2 mmol/L 4.8 4.2 4.9  Chloride 96 - 106 mmol/L 101 104 101  CO2 20 - 29 mmol/L 27 24 30   Calcium 8.6 - 10.2 mg/dL 10.3(H) 10.0 9.9    Contacted patient on 01/30/21 to discuss hypertension disease state.  Current antihypertensive regimen:  Amlodipine 10 mg - 1 tablet daily Carvedilol 25mg  - 1 tablet BID  Hydralazine 25 mg - 1 tablet TID   Patient verbally confirms he is taking the above medications as directed. Yes  How often are you checking your Blood Pressure? weekly  he checks his blood pressure in the morning before taking his medication.  Current home BP readings:  01/30/21 -  123/70   01/23/21 -  140/72    Patient states the addition of Jardiance is helping keep his BP lower. The patient reports he gets white coat syndrome in physicians office.  Wrist or arm cuff: omron arm cuff Salt intake:limits with adding to foods OTC medications including pseudoephedrine or NSAIDs? no  Any readings above 180/120? No  What recent interventions/DTPs have been made by any provider to improve Blood Pressure control since last CPP Visit:  Initiation of Jardiance   Any recent hospitalizations or ED visits since last visit with CPP? No  What diet changes have been made to improve Blood Pressure Control? The patient reports he is increasing his water intake.  What exercise is being done to improve your Blood Pressure Control?  The patient reports he walks on the treadmill every day   Adherence Review: Is the patient currently on ACE/ARB medication? No Does the  patient have >5 day gap between last estimated fill dates? No  Star Rating Drugs:  Medication:  Last Fill: Day Supply Jardiance 10mg  01/25/21 30  Metformin 500mg  11/21/20 90  Glipizide 5mg   11/12/20 90  Lantus   11/2020             42   Atorvastatin 20mg  12/11/20  90  Patient reports he gets Jardiance from the manufacturer program  Care Gaps: Annual wellness visit in last year? No   Most Recent Clinic BP reading: 160/70  71  01/02/21  If Diabetic: Most recent A1C reading:  7.2  09/07/20 Last eye exam / retinopathy screening: Unknown Last diabetic foot exam: 12/18/20  PCP appointment on 03/20/21 and Pulmonology appointment with on 02/14/21  [name], CPP notified  Avel Sensor, [place] Assistant [phone]  I have reviewed the care management and care coordination activities outlined in this encounter and I am certifying that I agree with the content of this note. No further action required.  [name], PharmD Clinical Pharmacist [facility] [phone]

## 2021-01-28 ENCOUNTER — Other Ambulatory Visit: Payer: Self-pay | Admitting: Family Medicine

## 2021-01-29 ENCOUNTER — Other Ambulatory Visit: Payer: Self-pay | Admitting: Family Medicine

## 2021-01-31 ENCOUNTER — Other Ambulatory Visit: Payer: Self-pay

## 2021-01-31 ENCOUNTER — Encounter: Payer: Self-pay | Admitting: Internal Medicine

## 2021-01-31 ENCOUNTER — Ambulatory Visit (INDEPENDENT_AMBULATORY_CARE_PROVIDER_SITE_OTHER): Payer: Medicare Other | Admitting: Internal Medicine

## 2021-01-31 ENCOUNTER — Other Ambulatory Visit: Payer: Self-pay | Admitting: Family Medicine

## 2021-01-31 VITALS — BP 144/80 | HR 64 | Ht 72.0 in | Wt 189.2 lb

## 2021-01-31 DIAGNOSIS — I5022 Chronic systolic (congestive) heart failure: Secondary | ICD-10-CM

## 2021-01-31 DIAGNOSIS — I442 Atrioventricular block, complete: Secondary | ICD-10-CM | POA: Diagnosis not present

## 2021-01-31 DIAGNOSIS — Z95810 Presence of automatic (implantable) cardiac defibrillator: Secondary | ICD-10-CM

## 2021-01-31 DIAGNOSIS — I255 Ischemic cardiomyopathy: Secondary | ICD-10-CM

## 2021-01-31 NOTE — Progress Notes (Signed)
Remote ICD transmission.   

## 2021-01-31 NOTE — Progress Notes (Signed)
Patient Care Team: Tonia Ghent, MD as PCP - General Angelia Mould, MD (Vascular Surgery) Arta Silence, MD (Gastroenterology) Michael Boston, MD (General Surgery) Eustace Moore, MD (Neurosurgery) Idolina Primer [facility]) Jerline Pain, MD (Cardiology) [name], [facility] as Pharmacist (Pharmacist)   HPI  Luke [name] is a 75 y.o. male Seen in followup for an CRT-  ICD St Jude  implanted for intermittent complete heart block in the setting of ischemic cardiomyopathy and left bundle branch block.  GEN change 6/22  Some diaphragmatic stimulation prior to GEN change, not associated with macro dislodgment.  LHC 2013  2V CAD Left anterior descending (LAD): focal 70-80% proximal. Long 40% mid vessel. Right coronary artery (RCA): Diffusely diseased. 50-60% mid vessel, 70% distal, 70% at bifurcation of PDA/PLOM.   The patient denies chest pain, shortness of breath, nocturnal dyspnea, orthopnea or peripheral edema.  There have been no palpitations, lightheadedness or syncope.  Interval diagnosis of idiopathic  Pulmonary fibrosis albeit with no shortness of breath      DATE TEST EF   6/13 LHC  [id number]; RCA diff 50-60%  3/14 Echo   30-35 %   3/19 Echo  55-65%      Date Cr K Hgb  10/20 1.17 4.5 14.3   10/21 1.24 4.9 14.3  6/22 1.17 4.8 15.3      Past Medical History:  Diagnosis Date   AAA (abdominal aortic aneurysm) ([place]) 2011   Per vascular surgery   Anxiety    no med. in use for anxiety but pt. speaks openly of his stress & anxious feelings regarding impending surgery     Atrial fibrillation ([place])    Automatic implantable cardioverter-defibrillator in situ    CAD (coronary artery disease)    Presumed CAD with nuclear scan October 09, 2011,  large anteroseptal MI and inferior MI. Catheterization scheduled October 15, 2011   Cardiomyopathy [facility])    Nuclear, October 09, 2011, EF 30%, multiple focal wall motion abnormalities   Diabetes mellitus    type II    Drug therapy    Hyperkalemia with spironolactone   Ejection fraction < 50%    EF 30%, nuclear, October 09, 2011   Ejection fraction < 50%    EF previously 30%  //   EF 30-35%, echo, July 14, 2012, severe diffuse hypokinesis, PA pressure 43 mm mercury   Fall due to ice or snow Feb.  19, 2015   HLD (hyperlipidemia)    Hypertension    white coat HTN-- often elevated in office and controlled on outside checks.   ICD (implantable cardioverter-defibrillator) in place    CRT-D placed March, 2014 complete heart block and k dysfunction   LBBB (left bundle branch block)    LBBB on EKG October 11, 2011,  no prior EKG has been done   Low testosterone    Hx of   Myocardial infarction ([place])    Told in 2015-never knew-showed up on stress test for AAA;    Pacemaker    PAD (peripheral artery disease) ([place])    Pancreatitis     Past Surgical History:  Procedure Laterality Date   ABDOMINAL AORTAGRAM N/A 10/28/2011   Procedure: ABDOMINAL [name];  Surgeon: Angelia Mould, MD;  Location: [facility] CATH LAB;  Service: Cardiovascular;  Laterality: N/A;   ABDOMINAL AORTIC ANEURYSM REPAIR     EVAR    BI-VENTRICULAR IMPLANTABLE CARDIOVERTER DEFIBRILLATOR N/A 07/16/2012   Procedure: BI-VENTRICULAR IMPLANTABLE CARDIOVERTER DEFIBRILLATOR  (CRT-D);  Surgeon: Deboraha Sprang, MD;  Location: [facility] CATH LAB;  Service: Cardiovascular;  Laterality: N/A;   BIV ICD GENERATOR CHANGEOUT N/A 10/25/2020   Procedure: BIV ICD GENERATOR CHANGEOUT;  Surgeon: Deboraha Sprang, MD;  Location: [place] CV LAB;  Service: Cardiovascular;  Laterality: N/A;   CARDIAC CATHETERIZATION     COLONOSCOPY  03/23/2018; 2020   ESOPHAGOGASTRODUODENOSCOPY N/A 02/12/2019   Procedure: ESOPHAGOGASTRODUODENOSCOPY (EGD);  Surgeon: Jackquline Denmark, MD;  Location: [facility] ENDOSCOPY;  Service: Endoscopy;  Laterality: N/A;   ESOPHAGOGASTRODUODENOSCOPY (EGD) WITH PROPOFOL N/A 02/05/2018   Procedure: ESOPHAGOGASTRODUODENOSCOPY (EGD) WITH PROPOFOL;  Surgeon: Mauri Pole, MD;  Location: [facility] ENDOSCOPY;  Service: Endoscopy;  Laterality: N/A;   ESOPHAGOGASTRODUODENOSCOPY (EGD) WITH PROPOFOL N/A 09/27/2018   Procedure: ESOPHAGOGASTRODUODENOSCOPY (EGD) WITH PROPOFOL;  Surgeon: Carol Ada, MD;  Location: [place];  Service: Endoscopy;  Laterality: N/A;   FOREIGN BODY REMOVAL  09/27/2018   Procedure: FOREIGN BODY REMOVAL;  Surgeon: Carol Ada, MD;  Location: [facility] ENDOSCOPY;  Service: Endoscopy;;   FOREIGN BODY REMOVAL  02/12/2019   Procedure: FOREIGN BODY REMOVAL;  Surgeon: Jackquline Denmark, MD;  Location: [facility] ENDOSCOPY;  Service: Endoscopy;;   HERNIA REPAIR  Jan. 9, 2015   INSERTION OF MESH N/A 05/14/2013   Procedure: INSERTION OF MESH;  Surgeon: Adin Hector, MD;  Location: [place];  Service: General;  Laterality: N/A;   LOWER EXTREMITY ANGIOGRAM Bilateral 10/28/2011   Procedure: LOWER EXTREMITY ANGIOGRAM;  Surgeon: Angelia Mould, MD;  Location: [facility] CATH LAB;  Service: Cardiovascular;  Laterality: Bilateral;   PACEMAKER INSERTION  07-16-12   pacemaker/defibrilator   POLYPECTOMY     POSTERIOR CERVICAL FUSION/FORAMINOTOMY N/A 06/09/2014   Procedure: Laminectomy - Cervical two-Cervcial four posterior cervical instrumented fusion Cervical two-cervical four;  Surgeon: Eustace Moore, MD;  [facility];  Service: Neurosurgery;  Laterality: N/A;  posterior    SAVORY DILATION N/A 02/05/2018   Procedure: SAVORY DILATION;  Surgeon: Mauri Pole, MD;  Location: [facility] ENDOSCOPY;  Service: Endoscopy;  Laterality: N/A;   TONSILLECTOMY     as a child    UMBILICAL HERNIA REPAIR N/A 05/14/2013   Procedure: LAPAROSCOPIC exploration and repair of hernia in abdominal ;  Surgeon: Adin Hector, MD;  Location: [place];  Service: General;  Laterality: N/A;   UPPER GASTROINTESTINAL ENDOSCOPY  2020    Current Outpatient Medications  Medication Sig Dispense Refill   albuterol (VENTOLIN HFA) 108 (90 Base) MCG/ACT inhaler Inhale 2 puffs into the lungs every 6 (six)  hours as needed for wheezing or shortness of breath. 8 g 6   amLODipine (NORVASC) 10 MG tablet TAKE 1 TABLET(10 MG) BY MOUTH DAILY 90 tablet 0   aspirin EC 81 MG tablet Take 81 mg by mouth daily.     atorvastatin (LIPITOR) 20 MG tablet Take 1 tablet (20 mg total) by mouth daily. 90 tablet 3   carvedilol (COREG) 25 MG tablet TAKE 1 TABLET BY MOUTH TWICE DAILY WITH MEALS 180 tablet 3   Cholecalciferol (VITAMIN D PO) Take 2,500 Units by mouth daily.     Cyanocobalamin 2500 MCG CHEW Chew 2,500 mcg by mouth daily.     glipiZIDE (GLUCOTROL) 5 MG tablet Take 1 tablet (5 mg total) by mouth 2 (two) times daily before a meal. 180 tablet 1   glucose blood (ACCU-CHEK AVIVA PLUS) test strip USE TO CHECK BLOOD GLUCOSE UP TO TWICE DAILY. 200 strip 3   hydrALAZINE (APRESOLINE) 25 MG tablet TAKE 1 TABLET(25 MG) BY  MOUTH THREE TIMES DAILY 270 tablet 1   insulin glargine (LANTUS SOLOSTAR) 100 UNIT/ML Solostar Pen INJECT 0.3 TO 0.35 MLS(30 TO 35 UNITS) INTO THE SKIN EVERY DAY 15 mL 3   JARDIANCE 10 MG TABS tablet TAKE 1 TABLET(10 MG) BY MOUTH DAILY BEFORE AND BREAKFAST 90 tablet 1   metFORMIN (GLUCOPHAGE) 500 MG tablet TAKE 2 TABLETS BY MOUTH TWICE DAILY WITH FOOD 360 tablet 2   Multiple Vitamin (MULTIVITAMIN) tablet Take 1 tablet by mouth daily.     omeprazole (PRILOSEC) 40 MG capsule TAKE 1 CAPSULE(40 MG) BY MOUTH TWICE DAILY 180 capsule 0   sildenafil (REVATIO) 20 MG tablet TAKE 3 TO 4 TABLETS BY MOUTH DAILY AS NEEDED 50 tablet 11   Tiotropium Bromide Monohydrate (SPIRIVA RESPIMAT) 1.25 MCG/ACT AERS Inhale 2 puffs into the lungs daily. 4 g 5   No current facility-administered medications for this visit.    Allergies  Allergen Reactions   Aldactone [Spironolactone] Other (See Comments)    Hyperkalemia   Codeine Rash and Hives   Januvia [Sitagliptin] Other (See Comments)    Diarrhea and heart racing   Lisinopril     Possible cause of pancreatitis    Review of Systems negative except from HPI and  PMH  Physical Exam BP (!) 144/80   Pulse 64   Ht 6' (1.829 m)   Wt 189 lb 3.2 oz (85.8 kg)   SpO2 97%   BMI 25.66 kg/m  Well developed and well nourished in no acute distress HENT normal Neck supple with JVP-flat Clear Device pocket well healed; without hematoma or erythema.  There is no tethering  Regular rate and rhythm, no  gallop No  murmur Abd-soft with an  No Clubbing cyanosis  edema Skin-warm and dry A & Oriented  Grossly normal sensory and motor function  ECG upright QRS lead V1 and negative QRS lead I active BS   Assessment Plan  Ischemic Cardiomyopathy interval normalization  Chronic HF diastolic  Implantable Defibrillator CRT  St Jude  .   Normal device function  Complete heart Block   Hyperlipidemia  Diabetes  Hypertension   Without symptoms of ischemia.  Continue atorvastatin 20, ASA 8,  and carvedilol 25 twice daily  Interval normalization of LV function.  Continue with amlodipine 10 carvedilol 25 as well as his Jardiance 10  Blood pressure reasonably controlled.  Lower at home.  Continue with amlodipine 10 his carvedilol

## 2021-01-31 NOTE — Patient Instructions (Signed)
Medication Instructions:  Your physician recommends that you continue on your current medications as directed. Please refer to the Current Medication list given to you today.  *If you need a refill on your cardiac medications before your next appointment, please call your pharmacy*   Lab Work: None ordered.  If you have labs (blood work) drawn today and your tests are completely normal, you will receive your results only by: [place] (if you have MyChart) OR A paper copy in the mail If you have any lab test that is abnormal or we need to change your treatment, we will call you to review the results.   Testing/Procedures: None ordered.    Follow-Up: At [facility], you and your health needs are our priority.  As part of our continuing mission to provide you with exceptional heart care, we have created designated Provider Care Teams.  These Care Teams include your primary Cardiologist (physician) and Advanced Practice Providers (APPs -  Physician Assistants and Nurse Practitioners) who all work together to provide you with the care you need, when you need it.  We recommend signing up for the patient portal called "MyChart".  Sign up information is provided on this After Visit Summary.  MyChart is used to connect with patients for Virtual Visits (Telemedicine).  Patients are able to view lab/test results, encounter notes, upcoming appointments, etc.  Non-urgent messages can be sent to your provider as well.   To learn more about what you can do with MyChart, go to [email or web address].    Your next appointment:   9 month(s)  The format for your next appointment:   In Person  Provider:   You will see one of the following Advanced Practice Providers on your designated Care Team:   Tommye Standard, [place] "[facility]" [place], [place]

## 2021-02-02 ENCOUNTER — Telehealth: Payer: Self-pay

## 2021-02-02 NOTE — Progress Notes (Addendum)
    Chronic Care Management Pharmacy Assistant   Name: DEROY NOAH  MRN: [id number] DOB: 04-30-1946  Reason for Encounter: Vania Rea (BI) PAP Update   02/09/2021 - Spoke with patient. He did call BI to update his income. Patient stated he was denied due to income still being to high.  02/08/2021 - Called to patient to follow up on status of him updating income. I left a message with his wife. She stated she has been reminding him to do so.   02/05/2021 - Called patient to follow up on status of him updating income. No answer. Left a message for patient to call me.   Called BI; PATIENT was initially denied due to income for [place] stated he put down wrong income PATIENT was to update his income with BI.  02/02/2021  [name], CPP notified  Marijean Niemann, [place] Pharmacy Assistant [phone]

## 2021-02-14 ENCOUNTER — Ambulatory Visit (INDEPENDENT_AMBULATORY_CARE_PROVIDER_SITE_OTHER): Payer: Medicare Other | Admitting: Internal Medicine

## 2021-02-14 ENCOUNTER — Telehealth: Payer: Self-pay | Admitting: Pharmacist

## 2021-02-14 ENCOUNTER — Other Ambulatory Visit: Payer: Self-pay

## 2021-02-14 ENCOUNTER — Other Ambulatory Visit (HOSPITAL_COMMUNITY): Payer: Self-pay

## 2021-02-14 ENCOUNTER — Telehealth: Payer: Self-pay | Admitting: Internal Medicine

## 2021-02-14 DIAGNOSIS — J439 Emphysema, unspecified: Secondary | ICD-10-CM

## 2021-02-14 DIAGNOSIS — J8410 Pulmonary fibrosis, unspecified: Secondary | ICD-10-CM

## 2021-02-14 DIAGNOSIS — J84112 Idiopathic pulmonary fibrosis: Secondary | ICD-10-CM

## 2021-02-14 DIAGNOSIS — J432 Centrilobular emphysema: Secondary | ICD-10-CM | POA: Diagnosis not present

## 2021-02-14 NOTE — Patient Instructions (Addendum)
ICD-10-CM   1. IPF (idiopathic pulmonary fibrosis) (HCC)  J84.112 Hepatic function panel    Hepatic function panel    2. Centrilobular emphysema ([place])  J43.2     3. 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HCC)  J43.9    J84.10      You have emphysema -  - for this start spiriva respimat 2 puff daily    - CMA to send refill  - not sure why this was not started last time - continue  albuterol prn - check alpha 1 next vist  Re IPF  - subjectively stable - not sure why esbriet was never started and not sure why pharmacist referral never went through - discussed drug again - decided to go with lower dose to see how side effects evolve  Plan Recommend - ESBRIET  - 2 pill three times daily a max -  slow titration upwards per protocol - - have d/w pharmacist devki who will start benefits inestiaation  Do spirometry and dlco in 4-6 weeks   Followup - return to see APP or Dr Chase Caller in 4-6 weeks but after spiro/dlco  - 30 min vist  - symptom socre an simple walk test at followup

## 2021-02-14 NOTE — Telephone Encounter (Signed)
Received notification from [facility] regarding a prior authorization for PIRFENIDONE. Authorization has been APPROVED from 02/14/21 to 02/14/22.   Per test claim, copay for 30 days supply is $1,564.76  Authorization # [id number]  Awaiting patient portion of [employer] PAP application.

## 2021-02-14 NOTE — Telephone Encounter (Signed)
[facility] provider page has been completed and in pending folder, awaiting patient portion.

## 2021-02-14 NOTE — Telephone Encounter (Signed)
Checked pt's last OV to make sure Rx for albuterol and spiriva had been sent to pharmacy and meds were sent to pharmacy 01/02/21 after pt's last visit. Called pharmacy to see if meds had been received and was told that the meds had been received and pt did pick up albuterol inhaler. Spiriva however was considered non-formulary and is requiring a PA to be done. Was told that PA request had been faxed to office 01/05/21, no PA request found. Was able to initiate PA due to having pt's insurance info.  PA request was received from (pharmacy): [facility] Phone:[phone] Fax: [phone] Medication name and strength: spiriva respimat 1.48mcg Ordering Provider: Chase Caller  Was PA started with CMM?: yes If yes, please enter KEY: [id number] Medication tried and failed: only albuterol hfa  PA sent to plan, time frame for approval / denial: Your information has been submitted to [place]. [employer] [place] will review the request and notify you of the determination decision directly, typically within 3 business days of your submission and once all necessary information is received.  You will also receive your request decision electronically. To check for an update later, open the request again from your dashboard.  If [employer] has not responded within the specified timeframe or if you have any questions about your PA submission, contact Antelope Garner directly at [facility]) [phone] or ([place]) [phone].  Called pt to update him on this and stated to pt that we could provide him samples while we waited for status update on PA and pt stated that he was asymptomatic and was okay waiting for status of PA. I did schedule pt's follow up appts with MR on 11/17 having PFT prior. Also stated to pt that pharmacy team have started process for Esbriet initiation and would update him and he verbalized understanding. Will follow up on PA status for spiriva.

## 2021-02-14 NOTE — Telephone Encounter (Signed)
Please start Esbriet BIV.  Dose: 267mg  three times daily x 7 days, then increase to 534 mg three times daily as maintenance thereafter  Max dose of 2 pills (534mg ) three times daily.  Patient had tele-visit with Dr. Chase Caller so PAP paperwork was unable to be completed. Will have to mail or email application to patient  Knox Saliva, PharmD, MPH, BCPS Clinical Pharmacist (Rheumatology and Pulmonology)

## 2021-02-14 NOTE — Telephone Encounter (Signed)
Submitted a Prior Authorization request to [facility] for PIRFENIDONE via CoverMyMeds. Will update once we receive a response.   Key: [id number]  Called patient and sent [employer] form via [place].

## 2021-02-14 NOTE — Progress Notes (Signed)
OV 01/02/2021  Subjective:  Patient ID: Luke [name], male , DOB: Aug 05, 1945 , age 75 y.o. , MRN: [id number] , ADDRESS: [place] [address] PCP Tonia Ghent, MD Patient Care Team: Tonia Ghent, MD as PCP - General Angelia Mould, MD (Vascular Surgery) Arta Silence, MD (Gastroenterology) Michael Boston, MD (General Surgery) Eustace Moore, MD (Neurosurgery) Juluis Rainier (Optometry) Jerline Pain, MD (Cardiology) [name], [facility] as Pharmacist (Pharmacist)  This Provider for this visit: Treatment Team:  Attending Provider: Brand Males, MD    01/02/2021 -transfer of care to the interstitial lung disease center for Dr. Chase Caller.  Referral from Dr. June Leap Chief Complaint  Patient presents with   Consult    ILD consult per Dr. Valeta Harms. Pt states he has been doing okay since last visit and denies any complaints.     HPI Luke [name] 75 y.o. -referred by Dr. June Leap for interstitial lung disease evaluation   [place] Integrated Comprehensive ILD Questionnaire  Symptoms:  -Reports insidious onset of shortness of breath gradually.  It is the same since it started.  He is unclear how long he is headed but he had it for a few years.  There is no cough at all.  There is no clearing of the throat.  There is no fatigue.  His appetite is good no nausea no vomiting no diarrhea no anxiety no depression no chronic pain    Past Medical History :   In 2009 he was diagnosed with systolic heart failure.  He believes etiology is ischemic based on a remote heart attack.  Has a history of diabetes for several years.  Has kidney disease not otherwise specified for several years.  His GFR in May 2022 was greater than 60 with a creatinine 1.23 mg percent.   His last echocardiogram was in 2019.  He sees cardiology Dr. Caryl Comes and Dr. Candee Furbish.  Overall he is deemed to be stable.  He had pacemaker check recently.  Denies any  collagen vascular disease or vasculitis sleep apnea.  Denies any PE.  He has had COVID-vaccine but has not had COVID. ROS:   -Positive for fatigue.  Has some dysphagia for the last few days but otherwise okay.  No nausea no vomiting no heartburn no snoring no rash no ulcers.   FAMILY HISTORY of LUNG DISEASE: Denies   EXPOSURE HISTORY: Smokes cigarettes 20 1966 in 2012 30 cigarettes/day.  He did have some passive smoking.  Did smoke marijuana between 1968 and 1970.  Once a month.  No cocaine use no intravenous drug use   HOME and HOBBY DETAILS : [place] the last 25 years the age of the home is 33 years.  There is no dampness.  No mildew no mold.  No humidifier use no CPAP use no nebulizer use.  No steam iron use no Jacuzzi use.  No misting [place] no pet birds or parakeets no pet gerbils.  No mold in the [facility] duct.  Last checked in 2009.  Does do gardening.  No birds at this no strong mats no water damage no Jacuzzi.   OCCUPATIONAL HISTORY (122 questions) : He was in [place] and got exposed to agent orange but otherwise detailed questioning is negative other than the fact he worked with some petroleum based cleaning agents.   PULMONARY TOXICITY HISTORY (27 items): He was on hydralazine in 2019.   HRCT May 2022 - personally  visualzied -definite progression is combined emphysema with UIP features.  IMPRESSION: 1. Spectrum of findings compatible with basilar predominant fibrotic interstitial lung disease without frank honeycombing, with mild progression at the lung bases since 03/25/2018 CT abdomen study, with more clear progression since 10/07/2011 CT abdomen study. Findings are categorized as probable UIP per consensus guidelines: Diagnosis of Idiopathic Pulmonary Fibrosis: An Official ATS/ERS/JRS/ALAT Clinical Practice Guideline. [place], Iss 5, [id number], Jan 04 2017. 2. Scattered small solid pulmonary nodules, largest 5 mm.  Follow-up noncontrast chest CT recommended in 12 months. This recommendation follows the consensus statement: Guidelines for Management of Incidental Pulmonary Nodules Detected on CT Images: From the Fleischner Society 2017; Radiology 2017; 284:228-243. 3. Three-vessel coronary atherosclerosis. 4. Chronic atrophy of pancreatic body and tail, progressive since 2019 CT abdomen study, without discrete mass on this noncontrast CT, indeterminate but presumably due to progressive chronic pancreatitis. 5. Aortic Atherosclerosis (ICD10-I70.0) and Emphysema (ICD10-J43.9).     Electronically Signed   By: Ilona Sorrel M.D.   On: 10/03/2020 16:53  SErology  Results for Luke [name], Luke [name]" (MRN [id number]) as of 01/02/2021 14:44  Ref. Range 11/13/2020 16:09  Anti Nuclear Antibody (ANA) Latest Ref Range: NEGATIVE  NEGATIVE  Angiotensin-Converting Enzyme Latest Ref Range: 9 - 67 U/L 27  Cyclic Citrullin Peptide Ab Latest Units: UNITS <16  ds DNA Ab Latest Units: IU/mL <1  Myeloperoxidase Abs Latest Units: AI <1.0  Serine Protease 3 Latest Units: AI <1.0  RA Latex Turbid. Latest Ref Range: <14 IU/mL <14  SSA (Ro) (ENA) Antibody, IgG Latest Ref Range: <1.0 NEG AI <1.0 NEG  SSB (La) (ENA) Antibody, IgG Latest Ref Range: <1.0 NEG AI <1.0 NEG  Scleroderma (Scl-70) (ENA) Antibody, IgG Latest Ref Range: <1.0 NEG AI <1.0 NEG    eCHO arch 2019  Study Conclusions   - Left ventricle: The cavity size was normal. Wall thickness was    normal. Systolic function was normal. The estimated ejection    fraction was in the range of 55% to 60%. Wall motion was normal;    there were no regional wall motion abnormalities. Doppler    parameters are consistent with abnormal left ventricular    relaxation (grade 1 diastolic dysfunction).  - Mitral valve: There was mild regurgitation.  - Pulmonary arteries: Systolic pressure was moderately increased.    PA peak pressure: 40 mm Hg (S).     OV 02/14/2021 -  telephine visit  Subjective:  Patient ID: Luke [name], male , DOB: 04-14-1946 , age 67 y.o. , MRN: [id number] , ADDRESS: [place] [address] PCP Tonia Ghent, MD Patient Care Team: Tonia Ghent, MD as PCP - General Angelia Mould, MD (Vascular Surgery) Arta Silence, MD (Gastroenterology) Michael Boston, MD (General Surgery) Eustace Moore, MD (Neurosurgery) Juluis Rainier [facility]) Jerline Pain, MD (Cardiology) [name], [employer] as Pharmacist (Pharmacist)   Type of visit: Telephone/Video Circumstance: COVID-19 national emergency Identification of patient Luke [name] with 09/02/1945 and MRN [id number] - 2 person identifier Risks: Risks, benefits, limitations of telephone visit explained. Patient understood and verbalized agreement to proceed Anyone else on call: just patient Patient location: [phone]  This provider location: [place], [employer]  This Provider for this visit: Treatment Team:  Attending Provider: Brand Males, MD    02/14/2021 -  FU IPF with some emphjysema. Has sCHF  HPI Luke [name] 75 y.o. -telephone visit to see  how his esbriet started is going butt turns out he says he never got referred to pharmacist. Pharmacist confirmed that she is yet to meet iwht him. No esbriet started. Says even spirivan not started. Also, supposed to be telephone visit but got confusing call and he showed up at front desk only to be sent home. Apologized for gap in customer service. Overall well. He said after last vsit he read more about esbriet and got apprehensive esp in light of CKD but lab review shows GFR > 60 this year. 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  Instructions   - Slowly increase the dose per protocol  -Always take it with food  -Any nausea you can try ginger capsules  -Give at least between 5 and 6  hours between dosing  -Good idea to participate in [facility] sponsored medication support program - this is optional  -Definitely apply sunscreen when you go out with this medication  - You will need monthly liver tests for 6 months and thereafter every 3 months  - explained in decreaesd renal functio that side ffects could be higher but his GFR is good enough for full dose  - explained benefit of slowing fibrosis progression and that NNT of 1 to 6 is pretty good   We resolved we will jsu start and titrate up to max dose of 2 pills tid which is 2/3rd normal dose    SYMPTOM SCALE - ILD 01/02/2021   O2 use ra  Shortness of Breath 0 -> 5 scale with 5 being worst (score 6 If unable to do)  At rest 0  Simple tasks - showers, clothes change, eating, shaving 0  Household (dishes, doing bed, laundry) 0  Shopping 0  Walking level at own pace 0  Walking up Stairs 2  Total (30-36) Dyspnea Score 2  How bad is your cough? 0  How bad is your fatigue 00  How bad is nausea 0  How bad is vomiting?  0  How bad is diarrhea? 00  How bad is anxiety? 0  How bad is depression 0        Simple office walk 185 feet x  3 laps goal with forehead probe 01/02/2021    O2 used ra   Number laps completed 3   Comments about pace avg   Resting Pulse Ox/HR 97% and 71/min   Final Pulse Ox/HR 95% and 94/min   Desaturated </= 88% no   Desaturated <= 3% points no   Got Tachycardic >/= 90/min yes   Symptoms at end of test none   Miscellaneous comments x    CT Chest data  No results found.  Results for Luke [name], Luke [name]" (MRN [id number]) as of 02/14/2021 10:42  Ref. Range 10/23/2020 10:57  Creatinine Latest Ref Range: 0.76 - 1.27 mg/dL 1.17  Results for Luke [name], Luke [name]" (MRN [id number]) as of 02/14/2021 10:42  Ref. Range 01/02/2021 15:40  AST Latest Ref Range: 0 - 37 U/L 17  ALT Latest Ref Range: 0 - 53 U/L 16    PFT  No flowsheet data found.     has a past medical  history of AAA (abdominal aortic aneurysm) ([place]) (2011), Anxiety, Atrial fibrillation ([place]), Automatic implantable cardioverter-defibrillator in situ, CAD (coronary artery disease), Cardiomyopathy ([place]), Diabetes mellitus, Drug therapy, Ejection fraction < 50%, Ejection fraction < 50%, Fall due to ice or snow (Feb.  19, 2015), HLD (hyperlipidemia), Hypertension, ICD (implantable cardioverter-defibrillator) in place, LBBB (left bundle branch block), Low testosterone, Myocardial infarction ([place]), Pacemaker, PAD (peripheral artery disease) ([place]), and Pancreatitis.   reports that he quit smoking about 16 years ago. His smoking use included cigarettes. He started smoking about 58 years ago. He has a 80.00 pack-year smoking history. He has never used smokeless tobacco.  Past Surgical History:  Procedure Laterality Date   ABDOMINAL AORTAGRAM N/A 10/28/2011   Procedure: ABDOMINAL [name];  Surgeon: Angelia Mould, MD;  Location: [facility] CATH LAB;  Service: Cardiovascular;  Laterality: N/A;   ABDOMINAL AORTIC ANEURYSM REPAIR     EVAR    BI-VENTRICULAR IMPLANTABLE CARDIOVERTER DEFIBRILLATOR N/A 07/16/2012   Procedure: BI-VENTRICULAR IMPLANTABLE CARDIOVERTER DEFIBRILLATOR  (CRT-D);  Surgeon: Deboraha Sprang, MD;  Location: [facility] CATH LAB;  Service: Cardiovascular;  Laterality: N/A;   BIV ICD GENERATOR CHANGEOUT N/A 10/25/2020   Procedure: BIV ICD GENERATOR CHANGEOUT;  Surgeon: Deboraha Sprang, MD;  Location: [place] CV LAB;  Service: Cardiovascular;  Laterality: N/A;   CARDIAC CATHETERIZATION     COLONOSCOPY  03/23/2018; 2020   ESOPHAGOGASTRODUODENOSCOPY N/A 02/12/2019   Procedure: ESOPHAGOGASTRODUODENOSCOPY (EGD);  Surgeon: Jackquline Denmark, MD;  Location: [facility] ENDOSCOPY;  Service: Endoscopy;  Laterality: N/A;   ESOPHAGOGASTRODUODENOSCOPY (EGD) WITH PROPOFOL N/A 02/05/2018   Procedure: ESOPHAGOGASTRODUODENOSCOPY (EGD) WITH PROPOFOL;  Surgeon: Mauri Pole, MD;  Location: [facility] ENDOSCOPY;  Service: Endoscopy;   Laterality: N/A;   ESOPHAGOGASTRODUODENOSCOPY (EGD) WITH PROPOFOL N/A 09/27/2018   Procedure: ESOPHAGOGASTRODUODENOSCOPY (EGD) WITH PROPOFOL;  Surgeon: Carol Ada, MD;  Location: [place];  Service: Endoscopy;  Laterality: N/A;   FOREIGN BODY REMOVAL  09/27/2018   Procedure: FOREIGN BODY REMOVAL;  Surgeon: Carol Ada, MD;  Location[facility] ENDOSCOPY;  Service: Endoscopy;;   FOREIGN BODY REMOVAL  02/12/2019   Procedure: FOREIGN BODY REMOVAL;  Surgeon: Jackquline Denmark, MD;  [facility] ENDOSCOPY;  Service: Endoscopy;;   HERNIA REPAIR  Jan. 9, 2015   INSERTION OF MESH N/A 05/14/2013   Procedure: INSERTION OF MESH;  Surgeon: Adin Hector, MD;  Location: [place];  Service: General;  Laterality: N/A;   LOWER EXTREMITY ANGIOGRAM Bilateral 10/28/2011   Procedure: LOWER EXTREMITY ANGIOGRAM;  Surgeon: Angelia Mould, MD;  Location[facility] CATH LAB;  Service: Cardiovascular;  Laterality: Bilateral;   PACEMAKER INSERTION  07-16-12   pacemaker/defibrilator   POLYPECTOMY     POSTERIOR CERVICAL FUSION/FORAMINOTOMY N/A 06/09/2014   Procedure: Laminectomy - Cervical two-Cervcial four posterior cervical instrumented fusion Cervical two-cervical four;  Surgeon: Eustace Moore, MD;  Location: [place] NEURO ORS;  Service: Neurosurgery;  Laterality: N/A;  posterior    SAVORY DILATION N/A 02/05/2018   Procedure: SAVORY DILATION;  Surgeon: Mauri Pole, MD;  Location: [facility] ENDOSCOPY;  Service: Endoscopy;  Laterality: N/A;   TONSILLECTOMY     as a child    UMBILICAL HERNIA REPAIR N/A 05/14/2013   Procedure: LAPAROSCOPIC exploration and repair of hernia in abdominal ;  Surgeon: Adin Hector, MD;  Location: [place];  Service: General;  Laterality: N/A;   UPPER GASTROINTESTINAL ENDOSCOPY  2020    Allergies  Allergen Reactions   Aldactone [Spironolactone] Other (See Comments)    Hyperkalemia   Codeine Rash and Hives   Januvia [Sitagliptin] Other (See Comments)    Diarrhea and heart racing   Lisinopril      Possible cause of pancreatitis    Immunization History  Administered Date(s) Administered   Fluad Quad(high Dose 65+) 02/23/2020   Influenza  Split 01/31/2011, 02/11/2012   Influenza Whole 02/27/2010   Influenza,inj,Quad PF,6+ Mos 03/18/2013, 03/25/2014, 01/19/2015, 02/21/2016, 02/27/2017, 02/12/2018, 02/18/2019   Influenza-Unspecified 03/03/2020   PFIZER(Purple Top)SARS-COV-2 Vaccination 05/23/2019, 06/12/2019   Pneumococcal Conjugate-13 10/18/2014   Pneumococcal Polysaccharide-23 11/14/2011   Td 07/05/2010    Family History  Problem Relation Age of Onset   Hypertension Mother    Stroke Mother    Hyperlipidemia Mother    Diabetes Sister    Heart disease Sister        Before age 20   Hypertension Sister    Hyperlipidemia Sister    Heart attack Sister    Lung cancer Father    Hypertension Son    Colon cancer Neg Hx    Prostate cancer Neg Hx    Esophageal cancer Neg Hx    Stomach cancer Neg Hx    Rectal cancer Neg Hx    Colon polyps Neg Hx      Current Outpatient Medications:    albuterol (VENTOLIN HFA) 108 (90 Base) MCG/ACT inhaler, Inhale 2 puffs into the lungs every 6 (six) hours as needed for wheezing or shortness of breath., Disp: 8 g, Rfl: 6   amLODipine (NORVASC) 10 MG tablet, TAKE 1 TABLET(10 MG) BY MOUTH DAILY, Disp: 90 tablet, Rfl: 0   aspirin EC 81 MG tablet, Take 81 mg by mouth daily., Disp: , Rfl:    atorvastatin (LIPITOR) 20 MG tablet, Take 1 tablet (20 mg total) by mouth daily., Disp: 90 tablet, Rfl: 3   carvedilol (COREG) 25 MG tablet, TAKE 1 TABLET BY MOUTH TWICE DAILY WITH MEALS, Disp: 180 tablet, Rfl: 3   Cholecalciferol (VITAMIN D PO), Take 2,500 Units by mouth daily., Disp: , Rfl:    Cyanocobalamin 2500 MCG CHEW, Chew 2,500 mcg by mouth daily., Disp: , Rfl:    glipiZIDE (GLUCOTROL) 5 MG tablet, TAKE 1 TABLET(5 MG) BY MOUTH TWICE DAILY BEFORE A MEAL, Disp: 180 tablet, Rfl: 1   glucose blood (ACCU-CHEK AVIVA PLUS) test strip, USE TO CHECK BLOOD GLUCOSE  UP TO TWICE DAILY., Disp: 200 strip, Rfl: 3   hydrALAZINE (APRESOLINE) 25 MG tablet, TAKE 1 TABLET(25 MG) BY MOUTH THREE TIMES DAILY, Disp: 270 tablet, Rfl: 1   insulin glargine (LANTUS SOLOSTAR) 100 UNIT/ML Solostar Pen, INJECT 0.3 TO 0.35 MLS(30 TO 35 UNITS) INTO THE SKIN EVERY DAY, Disp: 15 mL, Rfl: 3   JARDIANCE 10 MG TABS tablet, TAKE 1 TABLET(10 MG) BY MOUTH DAILY BEFORE AND BREAKFAST, Disp: 90 tablet, Rfl: 1   metFORMIN (GLUCOPHAGE) 500 MG tablet, TAKE 2 TABLETS BY MOUTH TWICE DAILY WITH FOOD, Disp: 360 tablet, Rfl: 2   Multiple Vitamin (MULTIVITAMIN) tablet, Take 1 tablet by mouth daily., Disp: , Rfl:    omeprazole (PRILOSEC) 40 MG capsule, TAKE 1 CAPSULE(40 MG) BY MOUTH TWICE DAILY, Disp: 180 capsule, Rfl: 0   sildenafil (REVATIO) 20 MG tablet, TAKE 3 TO 4 TABLETS BY MOUTH DAILY AS NEEDED, Disp: 50 tablet, Rfl: 11   Tiotropium Bromide Monohydrate (SPIRIVA RESPIMAT) 1.25 MCG/ACT AERS, Inhale 2 puffs into the lungs daily., Disp: 4 g, Rfl: 5      Objective:   There were no vitals filed for this visit.  Estimated body mass index is 25.66 kg/m as calculated from the following:   Height as of 01/31/21: 6' (1.829 m).   Weight as of 01/31/21: 189 lb 3.2 oz (85.8 kg).  @WEIGHTCHANGE @  There were no vitals filed for this visit.   Physical Exam Sounded normal  Assessment:       ICD-10-CM   1. IPF (idiopathic pulmonary fibrosis) (HCC)  G50.037 Pulmonary function test    2. Centrilobular emphysema (HCC)  J43.2 Alpha-1 antitrypsin phenotype    3. 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[place])  J43.9    J84.10          Plan:     Patient Instructions     ICD-10-CM   1. IPF (idiopathic pulmonary fibrosis) (HCC)  J84.112 Hepatic function panel    Hepatic function panel    2. Centrilobular emphysema ([place])  J43.2     3. 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HCC)  J43.9    J84.10      You have emphysema -  - for this start spiriva respimat 2 puff daily    - CMA  to send refill  - not sure why this was not started last time - continue  albuterol prn - check alpha 1 next vist  Re IPF  - subjectively stable - not sure why esbriet was never started and not sure why pharmacist referral never went through - discussed drug again - decided to go with lower dose to see how side effects evolve  Plan Recommend - ESBRIET  - 2 pill three times daily a max -  slow titration upwards per protocol - - have d/w pharmacist devki who will start benefits inestiaation  Do spirometry and dlco in 4-6 weeks   Followup - return to see APP or Dr Chase Caller in 4-6 weeks but after spiro/dlco  - 30 min vist  - symptom socre an simple walk test at followup  (Telephone visit - Level 03 visit: Estb 21-30 for this visit type which was visit type: telephone visit in total care time and counseling or/and coordination of care by this undersigned MD - Dr Brand Males. This includes one or more of the following for care delivered on 02/14/2021 same day: pre-charting, chart review, note writing, documentation discussion of test results, diagnostic or treatment recommendations, prognosis, risks and benefits of management options, instructions, education, compliance or risk-factor reduction. It excludes time spent by the [place] or office staff in the care of the patient. Actual time was 35 min. E&M code is [phone])   SIGNATURE    Dr. Brand Males, M.D., F.C.C.P,  Pulmonary and Critical Care Medicine Staff Physician, [place] Director - Interstitial Lung Disease  Program  Pulmonary [place] at [place], [place], [address]  Pager: [phone], If no answer or between  15:00h - 7:00h: call [phone] Telephone: [phone]  12:37 PM 02/14/2021

## 2021-02-15 ENCOUNTER — Other Ambulatory Visit: Payer: Self-pay | Admitting: Cardiology

## 2021-02-15 MED ORDER — INCRUSE ELLIPTA 62.5 MCG/INH IN AEPB: 1.0000 | INHALATION_SPRAY | Freq: Every day | RESPIRATORY_TRACT | 5 refills | Status: DC

## 2021-02-15 NOTE — Telephone Encounter (Signed)
PA for pt's Spiriva was denied as pt has never tried/failed any other inhalers.  Fax received stating that preferred inhalers that pt must try/fail prior to PA able to be approved for Spiriva include: Atrovent HFA, Combivent, Incruse.  Routing this info to MR.

## 2021-02-15 NOTE — Telephone Encounter (Signed)
Try Incruse 1 puff once daily

## 2021-02-15 NOTE — Telephone Encounter (Signed)
Called and spoke with pt letting him know that we were switching him from Spiriva to Incruse and he verbalized understanding. Rx has been sent to preferred pharmacy for pt. Nothing further needed.

## 2021-02-15 NOTE — Telephone Encounter (Signed)
Spoke with pt on the phone and he stated that he has not received his portion of the patient assistance paperwork that he needed to sign for Esbriet. Please advise.

## 2021-02-16 NOTE — Telephone Encounter (Signed)
Emailed patient electronic application link and sent through [employer] Will follow up.

## 2021-02-21 NOTE — Telephone Encounter (Signed)
Received notification that patient opened email with electronic patient consent portion that was sent. Faxed in provider portion to [place]. Awaiting response.  Phone- [phone]

## 2021-02-28 NOTE — Telephone Encounter (Signed)
Called and spoke with rep, PA approval information was not provided at time of PAP submission and pt was hung up in BIV process with [place]. PA information provided over the phone.  Received a fax from  [place] regarding an approval for [place] patient assistance from 02/28/2021 until 2025.  Phone number: [phone]

## 2021-03-01 NOTE — Telephone Encounter (Signed)
ATC #1 to discuss Esbriet approval. Unable to reach. Left VM requesting return call to direct pharmacy office number to review  [place] phone: [phone] [facility]: [phone]  Knox Saliva, PharmD, MPH, BCPS Clinical Pharmacist (Rheumatology and Pulmonology)

## 2021-03-02 ENCOUNTER — Telehealth: Payer: Self-pay | Admitting: Pharmacist

## 2021-03-02 DIAGNOSIS — Z5181 Encounter for therapeutic drug level monitoring: Secondary | ICD-10-CM

## 2021-03-02 DIAGNOSIS — J84112 Idiopathic pulmonary fibrosis: Secondary | ICD-10-CM

## 2021-03-05 MED ORDER — PIRFENIDONE 267 MG PO TABS: ORAL_TABLET | ORAL | 0 refills | Status: DC

## 2021-03-05 MED ORDER — PIRFENIDONE 267 MG PO TABS: 534.0000 mg | ORAL_TABLET | Freq: Three times a day (TID) | ORAL | 1 refills | Status: DC

## 2021-03-05 NOTE — Telephone Encounter (Signed)
Subjective:  Patient called today by [facility]monary pharmacy team for Esbriet new start.   Patient was last seen by Dr. Chase Caller on 02/14/21.  Pertinent past medical history includes IPF, ischemic cardiomyopathy, CAD, PVD, chronic systolic heart failure, history of AAA, history of pancreatitis, CKD Stage 3, T2DM, and HLD.   He is naive to antifibrotics - Ofev reserved as second line antifibrotic given his significant cardiovascular history  History of elevated LFTs: No History of diarrhea, nausea, vomiting: No  Objective: Allergies  Allergen Reactions   Aldactone [Spironolactone] Other (See Comments)    Hyperkalemia   Codeine Rash and Hives   Januvia [Sitagliptin] Other (See Comments)    Diarrhea and heart racing   Lisinopril     Possible cause of pancreatitis    Outpatient Encounter Medications as of 03/02/2021  Medication Sig   albuterol (VENTOLIN HFA) 108 (90 Base) MCG/ACT inhaler Inhale 2 puffs into the lungs every 6 (six) hours as needed for wheezing or shortness of breath.   amLODipine (NORVASC) 10 MG tablet TAKE 1 TABLET(10 MG) BY MOUTH DAILY   aspirin EC 81 MG tablet Take 81 mg by mouth daily.   atorvastatin (LIPITOR) 20 MG tablet Take 1 tablet (20 mg total) by mouth daily.   carvedilol (COREG) 25 MG tablet TAKE 1 TABLET BY MOUTH TWICE DAILY WITH MEALS   Cholecalciferol (VITAMIN D PO) Take 2,500 Units by mouth daily.   Cyanocobalamin 2500 MCG CHEW Chew 2,500 mcg by mouth daily.   glipiZIDE (GLUCOTROL) 5 MG tablet TAKE 1 TABLET(5 MG) BY MOUTH TWICE DAILY BEFORE A MEAL   glucose blood (ACCU-CHEK AVIVA PLUS) test strip USE TO CHECK BLOOD GLUCOSE UP TO TWICE DAILY.   hydrALAZINE (APRESOLINE) 25 MG tablet TAKE 1 TABLET(25 MG) BY MOUTH THREE TIMES DAILY   insulin glargine (LANTUS SOLOSTAR) 100 UNIT/ML Solostar Pen INJECT 0.3 TO 0.35 MLS(30 TO 35 UNITS) INTO THE SKIN EVERY DAY   JARDIANCE 10 MG TABS tablet TAKE 1 TABLET(10 MG) BY MOUTH DAILY BEFORE AND BREAKFAST   metFORMIN  (GLUCOPHAGE) 500 MG tablet TAKE 2 TABLETS BY MOUTH TWICE DAILY WITH FOOD   Multiple Vitamin (MULTIVITAMIN) tablet Take 1 tablet by mouth daily.   omeprazole (PRILOSEC) 40 MG capsule TAKE 1 CAPSULE(40 MG) BY MOUTH TWICE DAILY   sildenafil (REVATIO) 20 MG tablet TAKE 3 TO 4 TABLETS BY MOUTH DAILY AS NEEDED   umeclidinium bromide (INCRUSE ELLIPTA) 62.5 MCG/INH AEPB Inhale 1 puff into the lungs daily.   No facility-administered encounter medications on file as of 03/02/2021.     Immunization History  Administered Date(s) Administered   Fluad Quad(high Dose 65+) 02/23/2020   Influenza Split 01/31/2011, 02/11/2012   Influenza Whole 02/27/2010   Influenza,inj,Quad PF,6+ Mos 03/18/2013, 03/25/2014, 01/19/2015, 02/21/2016, 02/27/2017, 02/12/2018, 02/18/2019   Influenza-Unspecified 03/03/2020   PFIZER(Purple Top)SARS-COV-2 Vaccination 05/23/2019, 06/12/2019   Pneumococcal Conjugate-13 10/18/2014   Pneumococcal Polysaccharide-23 11/14/2011   Td 07/05/2010    PFT's No results found for: FEV1, FVC, FEV1FVC, TLC, DLCO   CMP     Component Value Date/Time   NA 136 10/23/2020 1057   K 4.8 10/23/2020 1057   CL 101 10/23/2020 1057   CO2 27 10/23/2020 1057   GLUCOSE 149 (H) 10/23/2020 1057   GLUCOSE 126 (H) 09/15/2020 2227   BUN 22 10/23/2020 1057   CREATININE 1.17 10/23/2020 1057   CREATININE 1.01 06/29/2012 0908   CALCIUM 10.3 (H) 10/23/2020 1057   PROT 7.4 01/02/2021 1540   ALBUMIN 4.3 01/02/2021 1540  AST 17 01/02/2021 1540   ALT 16 01/02/2021 1540   ALKPHOS 71 01/02/2021 1540   BILITOT 0.6 01/02/2021 1540   GFRNONAA >60 09/15/2020 2227   GFRAA >60 02/12/2019 0956    CBC    Component Value Date/Time   WBC 9.7 10/23/2020 1057   WBC 10.2 09/15/2020 2227   RBC 4.67 10/23/2020 1057   RBC 4.89 09/15/2020 2227   HGB 15.3 10/23/2020 1057   HCT 45.2 10/23/2020 1057   PLT 265 10/23/2020 1057   MCV 97 10/23/2020 1057   MCH 32.8 10/23/2020 1057   MCH 33.1 09/15/2020 2227   MCHC 33.8  10/23/2020 1057   MCHC 34.0 09/15/2020 2227   RDW 13.1 10/23/2020 1057   LYMPHSABS 2.2 09/15/2020 2227   MONOABS 0.9 09/15/2020 2227   EOSABS 0.3 09/15/2020 2227   BASOSABS 0.1 09/15/2020 2227    LFT's Hepatic Function Latest Ref Rng & Units 01/02/2021 02/16/2020 02/18/2019  Total Protein 6.0 - 8.3 g/dL 7.4 7.2 7.1  Albumin 3.5 - 5.2 g/dL 4.3 4.4 4.4  AST 0 - 37 U/L $Remo'17 16 17  'AYPPh$ ALT 0 - 53 U/L $Remo'16 18 24  'PCPDZ$ Alk Phosphatase 39 - 117 U/L 71 61 77  Total Bilirubin 0.2 - 1.2 mg/dL 0.6 0.6 0.6  Bilirubin, Direct 0.0 - 0.3 mg/dL 0.1 - -    HRCT (10/03/20) - Spectrum of findings compatible with basilar predominant fibrotic interstitial lung disease without frank honeycombing, with mild progression at the lung bases since 03/25/2018 CT abdomen study, with more clear progression since 10/07/2011 CT abdomen study. Findings are categorized as probable UIP per consensus guidelines  Assessment and Plan  Esbriet Medication Management Thoroughly counseled patient on the efficacy, mechanism of action, dosing, administration, adverse effects, and monitoring parameters of Esbriet.  Patient verbalized understanding.   Goals of Therapy: Will not stop or reverse the progression of ILD. It will slow the progression of ILD.   Dosing: Starting dose will be Esbriet 267 mg 1 tablet three times daily for 7 days, then 2 tablets three times daily as maintenance.  Stressed the importance of taking with meals and space at least 5-6 hours apart to minimize stomach upset.   Starter dose (Month 1) ordered as no print today. Rx for maintenance dose will be lower than usual d/t his CKD - sent to [facility] today.  Adverse Effects: Nausea, vomiting, diarrhea, weight loss Abdominal pain GERD Sun sensitivity/rash - patient advised to wear sunscreen when exposed to sunlight  Dizziness Fatigue  Monitoring: Monitor for diarrhea, nausea and vomiting, GI perforation, hepatotoxicity  Monitor LFTs - baseline, monthly for  first 6 months, then every 3 months routinely. Future CMP lab order placed today CBC w differential at baseline and every 3 months routinely  Access: Approval of Esbriet through: patient assistance Rx sent to: [place] ([place]) for [place]: [phone] Patient provided with phone number for both and advised to call Celso Amy today after our conversation to complete welcome call and then request to be transferred to [place] to schedule Esbriet order. I offered to conference call with Celso Amy to complete call, but patient deferred and stated he will be able to do so.  Medication Reconciliation A drug regimen assessment was performed, including review of allergies, interactions, disease-state management, dosing and immunization history. Medications were reviewed with the patient, including name, instructions, indication, goals of therapy, potential side effects, importance of adherence, and safe use. No DDI's per patient's current med list identified  Immunizations Patient is indicated for  the influenzae, pneumonia, and shingles vaccinations. Patient has received 2 COVID19 vaccines.  F/u with Dr. Chase Caller scheduled for 03/22/21  This appointment required 30 minutes of patient care (this includes precharting, chart review, review of results, face-to-face care, etc.).  Thank you for involving pharmacy to assist in providing this patient's care.    Knox Saliva, PharmD, MPH, BCPS Clinical Pharmacist (Rheumatology and Pulmonology)

## 2021-03-05 NOTE — Telephone Encounter (Signed)
Called patient and counseled on Esbriet in separate telephone encounter  Knox Saliva, PharmD, MPH, BCPS Clinical Pharmacist (Rheumatology and Pulmonology)

## 2021-03-08 ENCOUNTER — Telehealth: Payer: Self-pay | Admitting: Internal Medicine

## 2021-03-08 NOTE — Telephone Encounter (Signed)
Threasa Beards states does not need clarification on Esbriet. Does not needs a call back.

## 2021-03-09 ENCOUNTER — Other Ambulatory Visit: Payer: Self-pay | Admitting: Family Medicine

## 2021-03-09 ENCOUNTER — Other Ambulatory Visit: Payer: Self-pay | Admitting: Cardiology

## 2021-03-13 ENCOUNTER — Other Ambulatory Visit: Payer: Medicare Other

## 2021-03-14 NOTE — Telephone Encounter (Signed)
Called patient to f/u on Esbriet shipment. He is expected to receive first shipment on 03/23/21.  Knox Saliva, PharmD, MPH, BCPS Clinical Pharmacist (Rheumatology and Pulmonology)

## 2021-03-18 DIAGNOSIS — Z20822 Contact with and (suspected) exposure to covid-19: Secondary | ICD-10-CM | POA: Diagnosis not present

## 2021-03-19 ENCOUNTER — Telehealth: Payer: Self-pay | Admitting: Family Medicine

## 2021-03-19 ENCOUNTER — Telehealth (INDEPENDENT_AMBULATORY_CARE_PROVIDER_SITE_OTHER): Payer: Medicare Other | Admitting: Family Medicine

## 2021-03-19 ENCOUNTER — Other Ambulatory Visit: Payer: Self-pay

## 2021-03-19 ENCOUNTER — Encounter: Payer: Self-pay | Admitting: Family Medicine

## 2021-03-19 DIAGNOSIS — I255 Ischemic cardiomyopathy: Secondary | ICD-10-CM | POA: Diagnosis not present

## 2021-03-19 DIAGNOSIS — J84112 Idiopathic pulmonary fibrosis: Secondary | ICD-10-CM | POA: Diagnosis not present

## 2021-03-19 DIAGNOSIS — U071 COVID-19: Secondary | ICD-10-CM

## 2021-03-19 DIAGNOSIS — E1159 Type 2 diabetes mellitus with other circulatory complications: Secondary | ICD-10-CM | POA: Diagnosis not present

## 2021-03-19 DIAGNOSIS — E7800 Pure hypercholesterolemia, unspecified: Secondary | ICD-10-CM | POA: Diagnosis not present

## 2021-03-19 MED ORDER — SIMVASTATIN 40 MG PO TABS: 40.0000 mg | ORAL_TABLET | Freq: Every day | ORAL | 3 refills | Status: DC

## 2021-03-19 MED ORDER — MOLNUPIRAVIR EUA 200MG CAPSULE: 4.0000 | ORAL_CAPSULE | Freq: Two times a day (BID) | ORAL | 0 refills | Status: AC

## 2021-03-19 NOTE — Progress Notes (Signed)
Virtual visit completed through WebEx or similar program Patient location: home  Provider location: [facility], office  Participants: Patient and me (unless stated otherwise below)  Pandemic considerations d/w pt.   Limitations and rationale for visit method d/w patient.  Patient agreed to proceed.   CC: URI sx.    HPI:  Patient has had a cough, fever, sore throat, fatigue and chills since Friday night. Has been taking tylenol and robotussion. Took Covid test Saturday and Sunday and both were positive.  Sx started Friday.  Not SOB.  He felt like he was getting better today.  Temp down to 98.5 today.  BP and pulse wnl, normal pulse ox.  ST is improved with tylenol.  Cough is better.    He hasn't started pirfenidone yet.  He hasn't gotten it yet.  He was asking about renal and liver effect with pirfenidone and dosing.  We should be able to monitor this here in the clinic if not done at the pulmonary clinic.  Discussed.  He lost weight on jardiance, with inc in urination.  Down 9 lbs.  D/w pt about taking 1/2 tab vs getting off med. He wanted to get his f/u labs and then go from there, w/o med change in the meantime.   I asked him to stop med if he continues to lose weight.    We talked about lipitor.  He stopped it and got better.  He has tolerated simvastatin in spite of taking amlodipine.  D/w pt. routine statin cautions given to patient.  Given that he has tolerated amlodipine and simvastatin, it likely makes sense to continue with that.  Meds and allergies reviewed.   ROS: Per HPI unless specifically indicated in ROS section   NAD Speech wnl  A/P:  COVID positive.  He felt like he was getting better today.  Discussed options.  Supportive care in the meantime.  Discussed routine quarantine instructions.  I sent a prescription for molnupiravir in the meantime.  If he is not continuing to improve then he can start the medication.  If he continues to improve and does not need  it, then he does not need to start it.  He agrees with plan.  We can have a follow-up conversation regarding pirfenidone later on.  Hyperlipidemia. We talked about lipitor.  He stopped it and got better.  He has tolerated simvastatin in spite of taking amlodipine.  D/w pt. routine statin cautions given to patient.  Given that he has tolerated amlodipine and simvastatin, it likely makes sense to continue with that.  Diabetes.  He can continue Jardiance for now but if he continues to lose any weight that he can cut back on the dose or stop it totally.  We can get his follow-up labs at his next office visit and go from there.

## 2021-03-19 NOTE — Telephone Encounter (Signed)
Called patient and scheduled him today at 4pm with Dr. Damita Dunnings.

## 2021-03-19 NOTE — Telephone Encounter (Signed)
Pt has covid and want provider to call in a prescription

## 2021-03-20 ENCOUNTER — Other Ambulatory Visit: Payer: Self-pay

## 2021-03-20 ENCOUNTER — Encounter: Payer: Medicare Other | Admitting: Family Medicine

## 2021-03-20 DIAGNOSIS — Z20822 Contact with and (suspected) exposure to covid-19: Secondary | ICD-10-CM | POA: Diagnosis not present

## 2021-03-20 DIAGNOSIS — I739 Peripheral vascular disease, unspecified: Secondary | ICD-10-CM

## 2021-03-20 DIAGNOSIS — I714 Abdominal aortic aneurysm, without rupture, unspecified: Secondary | ICD-10-CM

## 2021-03-21 DIAGNOSIS — U071 COVID-19: Secondary | ICD-10-CM | POA: Insufficient documentation

## 2021-03-21 DIAGNOSIS — J84112 Idiopathic pulmonary fibrosis: Secondary | ICD-10-CM | POA: Insufficient documentation

## 2021-03-21 NOTE — Assessment & Plan Note (Signed)
  COVID positive.  He felt like he was getting better today.  Discussed options.  Supportive care in the meantime.  Discussed routine quarantine instructions.  I sent a prescription for molnupiravir in the meantime.  If he is not continuing to improve then he can start the medication.  If he continues to improve and does not need it, then he does not need to start it.  He agrees with plan.

## 2021-03-21 NOTE — Assessment & Plan Note (Signed)
   Hyperlipidemia. We talked about lipitor.  He stopped it and got better.  He has tolerated simvastatin in spite of taking amlodipine.  D/w pt. routine statin cautions given to patient.  Given that he has tolerated amlodipine and simvastatin, it likely makes sense to continue with that.

## 2021-03-21 NOTE — Assessment & Plan Note (Signed)
  Diabetes.  He can continue Jardiance for now but if he continues to lose any weight that he can cut back on the dose or stop it totally.  We can get his follow-up labs at his next office visit and go from there.

## 2021-03-21 NOTE — Assessment & Plan Note (Signed)
We can have a follow-up conversation regarding pirfenidone later on.

## 2021-03-22 ENCOUNTER — Ambulatory Visit: Payer: Medicare Other | Admitting: Internal Medicine

## 2021-04-12 ENCOUNTER — Ambulatory Visit (HOSPITAL_COMMUNITY)
Admission: RE | Admit: 2021-04-12 | Discharge: 2021-04-12 | Disposition: A | Discharge status: Home / Self Care | Payer: Medicare Other | Source: Ambulatory Visit | Attending: Physician Assistant | Admitting: Physician Assistant

## 2021-04-12 ENCOUNTER — Encounter: Payer: Self-pay | Admitting: Physician Assistant

## 2021-04-12 ENCOUNTER — Ambulatory Visit (INDEPENDENT_AMBULATORY_CARE_PROVIDER_SITE_OTHER)
Admission: RE | Admit: 2021-04-12 | Discharge: 2021-04-12 | Disposition: A | Discharge status: Home / Self Care | Payer: Medicare Other | Source: Ambulatory Visit | Attending: Physician Assistant | Admitting: Physician Assistant

## 2021-04-12 ENCOUNTER — Ambulatory Visit (INDEPENDENT_AMBULATORY_CARE_PROVIDER_SITE_OTHER): Payer: Medicare Other | Admitting: Physician Assistant

## 2021-04-12 ENCOUNTER — Other Ambulatory Visit: Payer: Self-pay

## 2021-04-12 VITALS — BP 147/79 | HR 73 | Temp 98.3°F | Resp 20 | Ht 72.0 in | Wt 182.6 lb

## 2021-04-12 DIAGNOSIS — I739 Peripheral vascular disease, unspecified: Secondary | ICD-10-CM | POA: Insufficient documentation

## 2021-04-12 DIAGNOSIS — I714 Abdominal aortic aneurysm, without rupture, unspecified: Secondary | ICD-10-CM | POA: Diagnosis not present

## 2021-04-12 NOTE — Progress Notes (Signed)
Office Note     CC:  follow up Requesting Provider:  Tonia Ghent, MD  HPI: Luke [name] is a 75 y.o. (1945/12/18) male who presents for surveillance of endovascular repair of AAA as well as for PAD.  He underwent endovascular repair of abdominal aortic aneurysm by Dr. Scot Dock in 2013.  He denies new or changing abdominal or back pain.  He is also followed for PAD with ABI as of 04/2020 right sided 0.54 and left side is 0.90.  He denies any traditional claudication of bilateral lower extremities.  He states he has neuropathy of his hands and feet however this is symmetrical.  He denies any rest pain or nonhealing wounds of bilateral lower extremities.  He is a former smoker.  He is on aspirin and statin daily.  He follows regularly with his PCP for management of chronic medical conditions including insulin-dependent diabetes mellitus.  He is also active with pulmonology for management of pulmonary fibrosis.    Past Medical History:  Diagnosis Date   AAA (abdominal aortic aneurysm) 2011   Per vascular surgery   Anxiety    no med. in use for anxiety but pt. speaks openly of his stress & anxious feelings regarding impending surgery     Atrial fibrillation ([place])    Automatic implantable cardioverter-defibrillator in situ    CAD (coronary artery disease)    Presumed CAD with nuclear scan October 09, 2011,  large anteroseptal MI and inferior MI. Catheterization scheduled October 15, 2011   Cardiomyopathy [facility])    Nuclear, October 09, 2011, EF 30%, multiple focal wall motion abnormalities   Diabetes mellitus    type II   Drug therapy    Hyperkalemia with spironolactone   Ejection fraction < 50%    EF 30%, nuclear, October 09, 2011   Ejection fraction < 50%    EF previously 30%  //   EF 30-35%, echo, July 14, 2012, severe diffuse hypokinesis, PA pressure 43 mm mercury   Fall due to ice or snow Feb.  19, 2015   HLD (hyperlipidemia)    Hypertension    white coat HTN-- often elevated in office and  controlled on outside checks.   ICD (implantable cardioverter-defibrillator) in place    CRT-D placed March, 2014 complete heart block and k dysfunction   LBBB (left bundle branch block)    LBBB on EKG October 11, 2011,  no prior EKG has been done   Low testosterone    Hx of   Myocardial infarction ([place])    Told in 2015-never knew-showed up on stress test for AAA;    Pacemaker    PAD (peripheral artery disease) ([place])    Pancreatitis     Past Surgical History:  Procedure Laterality Date   ABDOMINAL AORTAGRAM N/A 10/28/2011   Procedure: ABDOMINAL [name];  Surgeon: Angelia Mould, MD;  Location: [facility] CATH LAB;  Service: Cardiovascular;  Laterality: N/A;   ABDOMINAL AORTIC ANEURYSM REPAIR     EVAR    BI-VENTRICULAR IMPLANTABLE CARDIOVERTER DEFIBRILLATOR N/A 07/16/2012   Procedure: BI-VENTRICULAR IMPLANTABLE CARDIOVERTER DEFIBRILLATOR  (CRT-D);  Surgeon: Deboraha Sprang, MD;  Location[facility] LAB;  Service: Cardiovascular;  Laterality: N/A;   BIV ICD GENERATOR CHANGEOUT N/A 10/25/2020   Procedure: BIV ICD GENERATOR CHANGEOUT;  Surgeon: Deboraha Sprang, MD;  Location: [place] CV LAB;  Service: Cardiovascular;  Laterality: N/A;   CARDIAC CATHETERIZATION     COLONOSCOPY  03/23/2018; 2020   ESOPHAGOGASTRODUODENOSCOPY N/A 02/12/2019   Procedure:  ESOPHAGOGASTRODUODENOSCOPY (EGD);  Surgeon: Jackquline Denmark, MD;  Location: [facility] ENDOSCOPY;  Service: Endoscopy;  Laterality: N/A;   ESOPHAGOGASTRODUODENOSCOPY (EGD) WITH PROPOFOL N/A 02/05/2018   Procedure: ESOPHAGOGASTRODUODENOSCOPY (EGD) WITH PROPOFOL;  Surgeon: Mauri Pole, MD;  Location: [facility] ENDOSCOPY;  Service: Endoscopy;  Laterality: N/A;   ESOPHAGOGASTRODUODENOSCOPY (EGD) WITH PROPOFOL N/A 09/27/2018   Procedure: ESOPHAGOGASTRODUODENOSCOPY (EGD) WITH PROPOFOL;  Surgeon: Carol Ada, MD;  Location: [place];  Service: Endoscopy;  Laterality: N/A;   FOREIGN BODY REMOVAL  09/27/2018   Procedure: FOREIGN BODY REMOVAL;  Surgeon: Carol Ada, MD;  Location: [facility] ENDOSCOPY;  Service: Endoscopy;;   FOREIGN BODY REMOVAL  02/12/2019   Procedure: FOREIGN BODY REMOVAL;  Surgeon: Jackquline Denmark, MD;  Location: [facility] ENDOSCOPY;  Service: Endoscopy;;   HERNIA REPAIR  Jan. 9, 2015   INSERTION OF MESH N/A 05/14/2013   Procedure: INSERTION OF MESH;  Surgeon: Adin Hector, MD;  Location: [place];  Service: General;  Laterality: N/A;   LOWER EXTREMITY ANGIOGRAM Bilateral 10/28/2011   Procedure: LOWER EXTREMITY ANGIOGRAM;  Surgeon: Angelia Mould, MD;  Location: [facility] CATH LAB;  Service: Cardiovascular;  Laterality: Bilateral;   PACEMAKER INSERTION  07-16-12   pacemaker/defibrilator   POLYPECTOMY     POSTERIOR CERVICAL FUSION/FORAMINOTOMY N/A 06/09/2014   Procedure: Laminectomy - Cervical two-Cervcial four posterior cervical instrumented fusion Cervical two-cervical four;  Surgeon: Eustace Moore, MD;  Location: [place] NEURO ORS;  Service: Neurosurgery;  Laterality: N/A;  posterior    SAVORY DILATION N/A 02/05/2018   Procedure: SAVORY DILATION;  Surgeon: Mauri Pole, MD;  Location: [facility] ENDOSCOPY;  Service: Endoscopy;  Laterality: N/A;   TONSILLECTOMY     as a child    UMBILICAL HERNIA REPAIR N/A 05/14/2013   Procedure: LAPAROSCOPIC exploration and repair of hernia in abdominal ;  Surgeon: Adin Hector, MD;  Location: [place];  Service: General;  Laterality: N/A;   UPPER GASTROINTESTINAL ENDOSCOPY  2020    Social History   Socioeconomic History   Marital status: Married    Spouse name: Patti   Number of children: 1   Years of education: Not on file   Highest education level: Not on file  Occupational History   Occupation: retired  Tobacco Use   Smoking status: Former    Packs/day: 2.00    Years: 40.00    Pack years: 80.00    Types: Cigarettes    Start date: 1964    Quit date: 05/06/2004    Years since quitting: 16.9   Smokeless tobacco: Never  Vaping Use   Vaping Use: Never used  Substance and Sexual Activity   Alcohol use:  Yes    Alcohol/week: 0.0 - 2.0 standard drinks    Comment: beer or wine  occassionally   Drug use: No   Sexual activity: Yes    Partners: Female  Other Topics Concern   Not on file  Social History Narrative   [place] vet, he has known service related agent orange exposure.    Retired   Chiropractor daily.     Social Determinants of Health   Financial Resource Strain: Low Risk    Difficulty of Paying Living Expenses: Not very hard  Food Insecurity: Not on file  Transportation Needs: Not on file  Physical Activity: Not on file  Stress: Not on file  Social Connections: Not on file  Intimate Partner Violence: Not on file    Family History  Problem Relation Age of Onset   Hypertension Mother  Stroke Mother    Hyperlipidemia Mother    Diabetes Sister    Heart disease Sister        Before age 63   Hypertension Sister    Hyperlipidemia Sister    Heart attack Sister    Lung cancer Father    Hypertension Son    Colon cancer Neg Hx    Prostate cancer Neg Hx    Esophageal cancer Neg Hx    Stomach cancer Neg Hx    Rectal cancer Neg Hx    Colon polyps Neg Hx     Current Outpatient Medications  Medication Sig Dispense Refill   albuterol (VENTOLIN HFA) 108 (90 Base) MCG/ACT inhaler Inhale 2 puffs into the lungs every 6 (six) hours as needed for wheezing or shortness of breath. 8 g 6   amLODipine (NORVASC) 10 MG tablet TAKE 1 TABLET(10 MG) BY MOUTH DAILY 90 tablet 3   aspirin EC 81 MG tablet Take 81 mg by mouth daily.     carvedilol (COREG) 25 MG tablet TAKE 1 TABLET BY MOUTH TWICE DAILY WITH MEALS 180 tablet 3   Cholecalciferol (VITAMIN D PO) Take 2,500 Units by mouth daily.     Cyanocobalamin 2500 MCG CHEW Chew 2,500 mcg by mouth daily.     glipiZIDE (GLUCOTROL) 5 MG tablet TAKE 1 TABLET(5 MG) BY MOUTH TWICE DAILY BEFORE A MEAL 180 tablet 1   glucose blood (ACCU-CHEK AVIVA PLUS) test strip USE TO CHECK BLOOD GLUCOSE UP TO TWICE DAILY. 200 strip 3   hydrALAZINE (APRESOLINE) 25  MG tablet TAKE 1 TABLET(25 MG) BY MOUTH THREE TIMES DAILY 270 tablet 1   insulin glargine (LANTUS SOLOSTAR) 100 UNIT/ML Solostar Pen INJECT 0.3 TO 0.35 MLS(30 TO 35 UNITS) INTO THE SKIN EVERY DAY 15 mL 3   JARDIANCE 10 MG TABS tablet TAKE 1 TABLET(10 MG) BY MOUTH DAILY BEFORE AND BREAKFAST 90 tablet 1   metFORMIN (GLUCOPHAGE) 500 MG tablet TAKE 2 TABLETS BY MOUTH TWICE DAILY WITH FOOD 360 tablet 2   Multiple Vitamin (MULTIVITAMIN) tablet Take 1 tablet by mouth daily.     omeprazole (PRILOSEC) 40 MG capsule TAKE 1 CAPSULE(40 MG) BY MOUTH TWICE DAILY 180 capsule 0   Pirfenidone (ESBRIET) 267 MG TABS Take 2 tablets (534 mg total) by mouth 3 (three) times daily with meals. Month 2 and onward maintenance dose 540 tablet 1   Pirfenidone (ESBRIET) 267 MG TABS Take 1 tab three times daily for 7 days, then 2 tabs three times daily thereafter. 138 tablet 0   sildenafil (REVATIO) 20 MG tablet TAKE 3 TO 4 TABLETS BY MOUTH DAILY AS NEEDED 50 tablet 11   simvastatin (ZOCOR) 40 MG tablet Take 1 tablet (40 mg total) by mouth at bedtime. Has tolerated even with amlodipine use.  He didn't tolerate atorvastatin. 90 tablet 3   umeclidinium bromide (INCRUSE ELLIPTA) 62.5 MCG/INH AEPB Inhale 1 puff into the lungs daily. 30 each 5   No current facility-administered medications for this visit.    Allergies  Allergen Reactions   Aldactone [Spironolactone] Other (See Comments)    Hyperkalemia   Codeine Rash and Hives   Atorvastatin     Myalgias with lipitor.  Does tolerate simvastatin.     Januvia [Sitagliptin] Other (See Comments)    Diarrhea and heart racing   Lisinopril     Possible cause of pancreatitis     REVIEW OF SYSTEMS:   [X]  denotes positive finding, [ ]  denotes negative finding Cardiac  Comments:  Chest  pain or chest pressure:    Shortness of breath upon exertion:    Short of breath when lying flat:    Irregular heart rhythm:        Vascular    Pain in calf, thigh, or hip brought on by  ambulation:    Pain in feet at night that wakes you up from your sleep:     Blood clot in your veins:    Leg swelling:         Pulmonary    Oxygen at home:    Productive cough:     Wheezing:         Neurologic    Sudden weakness in arms or legs:     Sudden numbness in arms or legs:     Sudden onset of difficulty speaking or slurred speech:    Temporary loss of vision in one eye:     Problems with dizziness:         Gastrointestinal    Blood in stool:     Vomited blood:         Genitourinary    Burning when urinating:     Blood in urine:        Psychiatric    Major depression:         Hematologic    Bleeding problems:    Problems with blood clotting too easily:        Skin    Rashes or ulcers:        Constitutional    Fever or chills:      PHYSICAL EXAMINATION:  Vitals:   04/12/21 0908  BP: (!) 147/79  Pulse: 73  Resp: 20  Temp: 98.3 F (36.8 C)  TempSrc: Temporal  SpO2: 95%  Weight: 182 lb 9.6 oz (82.8 kg)  Height: 6' (1.829 m)    General:  WDWN in NAD; vital signs documented above Gait: Not observed HENT: WNL, normocephalic Pulmonary: normal non-labored breathing  Cardiac: regular HR Abdomen: soft, NT, no masses Skin: without rashes Vascular Exam/Pulses:  Right Left  Radial 2+ (normal) 2+ (normal)  DP absent absent  PT absent 2+ (normal)   Extremities: without ischemic changes, without Gangrene , without cellulitis; without open wounds;  Musculoskeletal: no muscle wasting or atrophy  Neurologic: A&O X 3;  No focal weakness or paresthesias are detected Psychiatric:  The pt has Normal affect.   Non-Invasive Vascular Imaging:   EVAR duplex Maximum diameter of aorta 3.3 cm No endoleaks noted  ABI/TBIToday's ABIToday's TBIPrevious ABIPrevious TBI  +-------+-----------+-----------+------------+------------+  Right  0.62       0.37       0.54        0.42          +-------+-----------+-----------+------------+------------+  Left    0.94       0.61       0.90        0.62           ASSESSMENT/PLAN:: 75 y.o. male here for follow up for surveillance of EVAR and PAD  -EVAR duplex demonstrates a stable aneurysmal sac without any endoleaks -ABIs are also unchanged from 1 year ago.  Findings suggest occlusive disease of right lower extremity however he does not complain of any claudication symptoms.  He is also without rest pain or tissue loss of bilateral lower extremity.  No indication for further work-up at this time. -Continue aspirin and statin daily -Continue regular follow-up with PCP for management of chronic medical conditions  including insulin-dependent diabetes -Recheck EVAR duplex and ABI in 1 year.  Call/return office sooner with any questions or concerns   Dagoberto Ligas, PA-C [facility] [phone]  Clinic MD:   Scot Dock

## 2021-04-13 ENCOUNTER — Ambulatory Visit (INDEPENDENT_AMBULATORY_CARE_PROVIDER_SITE_OTHER): Payer: Medicare Other | Admitting: Family Medicine

## 2021-04-13 ENCOUNTER — Encounter: Payer: Self-pay | Admitting: Family Medicine

## 2021-04-13 VITALS — BP 140/82 | HR 75 | Temp 98.0°F | Ht 72.0 in | Wt 186.0 lb

## 2021-04-13 DIAGNOSIS — E1159 Type 2 diabetes mellitus with other circulatory complications: Secondary | ICD-10-CM

## 2021-04-13 DIAGNOSIS — E7800 Pure hypercholesterolemia, unspecified: Secondary | ICD-10-CM | POA: Diagnosis not present

## 2021-04-13 DIAGNOSIS — J84112 Idiopathic pulmonary fibrosis: Secondary | ICD-10-CM

## 2021-04-13 DIAGNOSIS — Z20822 Contact with and (suspected) exposure to covid-19: Secondary | ICD-10-CM | POA: Diagnosis not present

## 2021-04-13 DIAGNOSIS — Z7189 Other specified counseling: Secondary | ICD-10-CM

## 2021-04-13 DIAGNOSIS — I10 Essential (primary) hypertension: Secondary | ICD-10-CM

## 2021-04-13 DIAGNOSIS — I255 Ischemic cardiomyopathy: Secondary | ICD-10-CM

## 2021-04-13 DIAGNOSIS — Z23 Encounter for immunization: Secondary | ICD-10-CM | POA: Diagnosis not present

## 2021-04-13 DIAGNOSIS — Z0000 Encounter for general adult medical examination without abnormal findings: Secondary | ICD-10-CM | POA: Diagnosis not present

## 2021-04-13 MED ORDER — PIRFENIDONE 267 MG PO TABS: ORAL_TABLET | ORAL | 0 refills | Status: DC

## 2021-04-13 MED ORDER — METFORMIN HCL 500 MG PO TABS: 1000.0000 mg | ORAL_TABLET | Freq: Two times a day (BID) | ORAL | 3 refills | Status: DC

## 2021-04-13 NOTE — Progress Notes (Signed)
This visit occurred during the SARS-CoV-2 public health emergency.  Safety protocols were in place, including screening questions prior to the visit, additional usage of staff PPE, and extensive cleaning of exam room while observing appropriate contact time as indicated for disinfecting solutions.  I have personally reviewed the Medicare Annual Wellness questionnaire and have noted 1. The patient's medical and social history 2. Their use of alcohol, tobacco or illicit drugs 3. Their current medications and supplements 4. The patient's functional ability including ADL's, fall risks, home safety risks and hearing or visual             impairment. 5. Diet and physical activities 6. Evidence for depression or mood disorders  The patients weight, height, BMI have been recorded in the chart and visual acuity is per eye clinic.  I have made referrals, counseling and provided education to the patient based review of the above and I have provided the pt with a written personalized care plan for preventive services.  Provider list updated- see scanned forms.  Routine anticipatory guidance given to patient.  See health maintenance. The possibility exists that previously documented standard health maintenance information may have been brought forward from a previous encounter into this note.  If needed, that same information has been updated to reflect the current situation based on today's encounter.    Flu 2022 Shingles d/w pt.  PNA up to date Tetanus 2012, d/w pt.  Covid vaccine prev done.  Colonoscopy 2021 Prostate cancer screening wnl 2022 Advance directive- wife designated if patient were incapacitated.   Cognitive function addressed- see scanned forms- and if abnormal then additional documentation follows.   In addition to [facility] Wellness, follow up visit for the below conditions:  Diabetes:  Using medications without difficulties: see below.   Hypoglycemic episodes: not now, only noted  when on jardiance Hyperglycemic episodes: no Feet problems:  change in sensation at baseline.  Blood Sugars averaging: usually 90-120 in the PMs and 70-110 in the AMs.  eye exam within last year: yes He had to quit jardiance due to polyuria, even at lower dose.    Pulmonary fibrosis.  He is on pirfenidone and has tolerated so far.  D/w pt.    Hypertension:    Using medication without problems or lightheadedness: yes Chest pain with exertion:no Edema: no Short of breath: no, still able to walk on treadmill and exert.  He is still working in the yard  Elevated Cholesterol: Using medications without problems:yes Muscle aches: no, d/w pt.  Diet compliance: yes Exercise: yes  He cleared covid sx, no residual sx.    He wore out his treadmill- it broke- and he can walk w/o claudication.  He got a good report from vascular yesterday, d/w pt.    PMH and SH reviewed  Meds, vitals, and allergies reviewed.   ROS: Per HPI.  Unless specifically indicated otherwise in HPI, the patient denies:  General: fever. Eyes: acute vision changes ENT: sore throat Cardiovascular: chest pain Respiratory: SOB GI: vomiting GU: dysuria Musculoskeletal: acute back pain Derm: acute rash Neuro: acute motor dysfunction Psych: worsening mood Endocrine: polydipsia Heme: bleeding Allergy: hayfever  GEN: nad, alert and oriented HEENT: ncat NECK: supple w/o LA CV: rrr. PULM: ctab, no inc wob ABD: soft, +bs EXT: no edema SKIN: no acute rash  Diabetic foot exam: Normal inspection No skin breakdown Callus noted L distal 1st MT-shaved down with 12 blade after getting consent from patient and cleaning the area with alcohol.  Significant portion  of the callus was successfully removed easily.  No ulceration.  He had a minimal amount of blood that oozed from the edge of the callus, 1-2 drops of blood.  I cover the area with a Band-Aid.  He tolerated this well and he felt better upon standing. Dec but  intactte DP pulses Dec sensation to light touch and monofilament on R foot, intact on L foot Nails thickened.

## 2021-04-13 NOTE — Patient Instructions (Signed)
Go to the lab on the way out.   If you have mychart we'll likely use that to update you.    Take care.  Glad to see you. Plan on recheck in about 6 months, sooner if needed.  Call about a podiatry appointment.

## 2021-04-14 LAB — CBC WITH DIFFERENTIAL/PLATELET
Absolute Monocytes: 857 cells/uL (ref 200–950)
Basophils Absolute: 82 cells/uL (ref 0–200)
Basophils Relative: 0.8 %
Eosinophils Absolute: 459 cells/uL (ref 15–500)
Eosinophils Relative: 4.5 %
HCT: 43.8 % (ref 38.5–50.0)
Hemoglobin: 15.3 g/dL (ref 13.2–17.1)
Lymphs Abs: 2111 cells/uL (ref 850–3900)
MCH: 33.3 pg — ABNORMAL HIGH (ref 27.0–33.0)
MCHC: 34.9 g/dL (ref 32.0–36.0)
MCV: 95.2 fL (ref 80.0–100.0)
MPV: 10.7 fL (ref 7.5–12.5)
Monocytes Relative: 8.4 %
Neutro Abs: 6691 cells/uL (ref 1500–7800)
Neutrophils Relative %: 65.6 %
Platelets: 270 10*3/uL (ref 140–400)
RBC: 4.6 10*6/uL (ref 4.20–5.80)
RDW: 12.6 % (ref 11.0–15.0)
Total Lymphocyte: 20.7 %
WBC: 10.2 10*3/uL (ref 3.8–10.8)

## 2021-04-14 LAB — HEMOGLOBIN A1C
Hgb A1c MFr Bld: 7.8 % of total Hgb — ABNORMAL HIGH (ref <5.7)
Mean Plasma Glucose: 177 mg/dL
eAG (mmol/L): 9.8 mmol/L

## 2021-04-14 LAB — COMPREHENSIVE METABOLIC PANEL
AG Ratio: 1.4 (calc) (ref 1.0–2.5)
ALT: 15 U/L (ref 9–46)
AST: 13 U/L (ref 10–35)
Albumin: 4.4 g/dL (ref 3.6–5.1)
Alkaline phosphatase (APISO): 71 U/L (ref 35–144)
BUN: 24 mg/dL (ref 7–25)
CO2: 27 mmol/L (ref 20–32)
Calcium: 10.2 mg/dL (ref 8.6–10.3)
Chloride: 102 mmol/L (ref 98–110)
Creat: 1.05 mg/dL (ref 0.70–1.28)
Globulin: 3.2 g/dL (calc) (ref 1.9–3.7)
Glucose, Bld: 104 mg/dL — ABNORMAL HIGH (ref 65–99)
Potassium: 4.7 mmol/L (ref 3.5–5.3)
Sodium: 140 mmol/L (ref 135–146)
Total Bilirubin: 0.5 mg/dL (ref 0.2–1.2)
Total Protein: 7.6 g/dL (ref 6.1–8.1)

## 2021-04-14 LAB — LIPID PANEL
Cholesterol: 147 mg/dL (ref <200)
HDL: 37 mg/dL — ABNORMAL LOW (ref >=40)
LDL Cholesterol (Calc): 74 mg/dL (calc)
Non-HDL Cholesterol (Calc): 110 mg/dL (calc) (ref <130)
Total CHOL/HDL Ratio: 4 (calc) (ref <5.0)
Triglycerides: 276 mg/dL — ABNORMAL HIGH (ref <150)

## 2021-04-15 NOTE — Assessment & Plan Note (Signed)
Per pulmonary.  See notes on follow-up labs.

## 2021-04-15 NOTE — Assessment & Plan Note (Signed)
  Flu 2022 Shingles d/w pt.  PNA up to date Tetanus 2012, d/w pt.  Covid vaccine prev done.  Colonoscopy 2021 Prostate cancer screening wnl 2022 Advance directive- wife designated if patient were incapacitated.   Cognitive function addressed- see scanned forms- and if abnormal then additional documentation follows.

## 2021-04-15 NOTE — Assessment & Plan Note (Signed)
Advance directive- wife designated if patient were incapacitated.  

## 2021-04-15 NOTE — Assessment & Plan Note (Signed)
He is tolerating simvastatin without any muscle aches.  Continue as is.  See notes on labs.

## 2021-04-15 NOTE — Assessment & Plan Note (Signed)
He could not tolerate Jardiance so he had to stop it in the meantime.  Discussed foot care and follow-up with podiatry.  See exam regarding shaving callus on the foot.  Continue metformin insulin and glipizide.  See notes on labs.

## 2021-04-15 NOTE — Assessment & Plan Note (Signed)
History of whitecoat hypertension noted.  See notes on labs.  Continue hydralazine carvedilol amlodipine.

## 2021-04-23 ENCOUNTER — Telehealth: Payer: Self-pay | Admitting: Family Medicine

## 2021-04-23 NOTE — Telephone Encounter (Signed)
Pt called to get lab results °

## 2021-04-23 NOTE — Telephone Encounter (Signed)
Spoke with patient about results. See result note.

## 2021-04-25 ENCOUNTER — Ambulatory Visit (INDEPENDENT_AMBULATORY_CARE_PROVIDER_SITE_OTHER): Payer: Medicare Other

## 2021-04-25 DIAGNOSIS — I255 Ischemic cardiomyopathy: Secondary | ICD-10-CM

## 2021-04-25 LAB — CUP PACEART REMOTE DEVICE CHECK
Battery Remaining Longevity: 89 mo
Battery Remaining Percentage: 93 %
Battery Voltage: 2.99 V
Brady Statistic AP VP Percent: 15 %
Brady Statistic AP VS Percent: 1.3 %
Brady Statistic AS VP Percent: 75 %
Brady Statistic AS VS Percent: 5.2 %
Brady Statistic RA Percent Paced: 11 %
Date Time Interrogation Session: 20221221011118
HighPow Impedance: 75 Ohm
Implantable Lead Implant Date: 20140313
Implantable Lead Implant Date: 20140313
Implantable Lead Implant Date: 20140313
Implantable Lead Location: 753858
Implantable Lead Location: 753859
Implantable Lead Location: 753860
Implantable Pulse Generator Implant Date: 20220622
Lead Channel Impedance Value: 380 Ohm
Lead Channel Impedance Value: 430 Ohm
Lead Channel Impedance Value: 790 Ohm
Lead Channel Pacing Threshold Amplitude: 0.625 V
Lead Channel Pacing Threshold Amplitude: 0.75 V
Lead Channel Pacing Threshold Amplitude: 1.375 V
Lead Channel Pacing Threshold Pulse Width: 0.5 ms
Lead Channel Pacing Threshold Pulse Width: 0.5 ms
Lead Channel Pacing Threshold Pulse Width: 0.6 ms
Lead Channel Sensing Intrinsic Amplitude: 11.8 mV
Lead Channel Sensing Intrinsic Amplitude: 3.1 mV
Lead Channel Setting Pacing Amplitude: 1.625
Lead Channel Setting Pacing Amplitude: 1.875
Lead Channel Setting Pacing Amplitude: 2 V
Lead Channel Setting Pacing Pulse Width: 0.5 ms
Lead Channel Setting Pacing Pulse Width: 0.6 ms
Lead Channel Setting Sensing Sensitivity: 0.5 mV
Pulse Gen Serial Number: 810030647

## 2021-04-28 ENCOUNTER — Other Ambulatory Visit: Payer: Self-pay | Admitting: Family Medicine

## 2021-04-30 ENCOUNTER — Telehealth: Payer: Self-pay

## 2021-05-04 NOTE — Progress Notes (Signed)
Remote ICD transmission.   

## 2021-05-06 HISTORY — PX: CATARACT EXTRACTION: SUR2

## 2021-05-07 NOTE — Telephone Encounter (Signed)
Patient called, reports he filled out [employer] application and dropped it off at [employer] office with my name on it.

## 2021-05-08 NOTE — Telephone Encounter (Signed)
I am faxing over to [place] the pages we have. I will also give to Dr Damita Dunnings to sign his page before faxing.

## 2021-05-08 NOTE — Telephone Encounter (Signed)
Patient states he sent pages 2-4 (pages 1 and 5 were directions only). I need to make sure page 4 has the [place] address since [employer] directly to PCP office. Can you fax the application to me? I'll review and we can go from there.  [name], PharmD Clinical Pharmacist Practitioner [facility] [phone]

## 2021-05-08 NOTE — Telephone Encounter (Signed)
I do not remember seeing any applications from this patient nor any patient in the last two weeks.

## 2021-05-08 NOTE — Telephone Encounter (Signed)
I have not seen anything for this patient. I checked up front and did not find this. Janett Billow and Dr Damita Dunnings did it come to you possibly? Caryl Pina did say there was something that was emailed to [place] about a week ago on a patient but not sure if this was the one or not

## 2021-05-08 NOTE — Telephone Encounter (Signed)
I don't recall seeing this form come in.

## 2021-05-08 NOTE — Telephone Encounter (Signed)
I was able to locate the form in the folder. There are only 3 pages 1-3. Page 4 and 5 missing. Luke Lull do you have that? Do I need that in order to go ahead and fax it over?

## 2021-05-08 NOTE — Telephone Encounter (Signed)
Spoke with patient again today. Pt reports he left forms in [place] folder with "Luke [name]" on the front on 12/27. Handed it to a male at front desk. I have not received it by email. Will forward to support pool.

## 2021-05-09 NOTE — Telephone Encounter (Signed)
Noted page 3 is missing some information on the prescription and provider section. Sent Anastasiya a message to assist.

## 2021-05-10 ENCOUNTER — Ambulatory Visit (INDEPENDENT_AMBULATORY_CARE_PROVIDER_SITE_OTHER): Payer: Medicare Other | Admitting: Internal Medicine

## 2021-05-10 ENCOUNTER — Encounter: Payer: Self-pay | Admitting: Internal Medicine

## 2021-05-10 ENCOUNTER — Other Ambulatory Visit: Payer: Self-pay

## 2021-05-10 VITALS — BP 130/72 | HR 73 | Temp 98.0°F | Ht 72.0 in | Wt 191.0 lb

## 2021-05-10 DIAGNOSIS — J439 Emphysema, unspecified: Secondary | ICD-10-CM

## 2021-05-10 DIAGNOSIS — Z5181 Encounter for therapeutic drug level monitoring: Secondary | ICD-10-CM | POA: Diagnosis not present

## 2021-05-10 DIAGNOSIS — J432 Centrilobular emphysema: Secondary | ICD-10-CM | POA: Diagnosis not present

## 2021-05-10 DIAGNOSIS — J84112 Idiopathic pulmonary fibrosis: Secondary | ICD-10-CM

## 2021-05-10 DIAGNOSIS — J8410 Pulmonary fibrosis, unspecified: Secondary | ICD-10-CM | POA: Diagnosis not present

## 2021-05-10 LAB — PULMONARY FUNCTION TEST
DL/VA % pred: 56 %
DL/VA: 2.22 ml/min/mmHg/L
DLCO cor % pred: 42 %
DLCO cor: 11.36 ml/min/mmHg
DLCO unc % pred: 43 %
DLCO unc: 11.58 ml/min/mmHg
FEF 25-75 Pre: 1.52 L/sec
FEF2575-%Pred-Pre: 61 %
FEV1-%Pred-Pre: 75 %
FEV1-Pre: 2.55 L
FEV1FVC-%Pred-Pre: 96 %
FEV6-%Pred-Pre: 83 %
FEV6-Pre: 3.63 L
FEV6FVC-%Pred-Pre: 106 %
FVC-%Pred-Pre: 78 %
FVC-Pre: 3.63 L
Pre FEV1/FVC ratio: 70 %
Pre FEV6/FVC Ratio: 100 %

## 2021-05-10 NOTE — Telephone Encounter (Signed)
I filled out my part.  Thanks.

## 2021-05-10 NOTE — Patient Instructions (Addendum)
ICD-10-CM   1. IPF (idiopathic pulmonary fibrosis) (HCC)  J84.112 Hepatic function panel    Hepatic function panel    2. Centrilobular emphysema ([place])  J43.2     3. 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[place])  J43.9    J84.10      You have emphysema -  -continue spiriva respimat 2 puff daily  - continue  albuterol prn - check alpha 1  AT phenotype blood work 05/10/2021   Re IPF  - subjectively stable  - tolerating esbriet well at 2 pills three times daily after initial side effect of diarrhea  Plan Check LFT 05/10/2021 and monthly for next 3 months Check BMET in 3 months [place] ESBRIET  at 2 pill three times daily with food, 6h apart and wear sunscreen  - at next visit if kidneys  holding okay then we can aim to increase to full dose 3 pills three times daily  Chronic Dry Skin  Plan  - flax seed  Followup - return to see r Dr Chase Caller in  12  weeks but after BMET/LFT check    = symptoms score and walk test at followup - 30 MIN VISIT

## 2021-05-10 NOTE — Progress Notes (Signed)
Spirometry/DLCO performed today. 

## 2021-05-10 NOTE — Telephone Encounter (Signed)
Faxed and filled out as requested

## 2021-05-10 NOTE — Progress Notes (Signed)
OV 01/02/2021  Subjective:  Patient ID: Luke [name], male , DOB: December 31, 1945 , age 76 y.o. , MRN: [id number] , ADDRESS: [place] [address] PCP Tonia Ghent, MD Patient Care Team: Tonia Ghent, MD as PCP - General Angelia Mould, MD (Vascular Surgery) Arta Silence, MD (Gastroenterology) Michael Boston, MD (General Surgery) Eustace Moore, MD (Neurosurgery) Juluis Rainier (Optometry) Jerline Pain, MD (Cardiology) [name], [facility] as Pharmacist (Pharmacist)  This Provider for this visit: Treatment Team:  Attending Provider: Brand Males, MD    01/02/2021 -transfer of care to the interstitial lung disease center for Dr. Chase Caller.  Referral from Dr. June Leap Chief Complaint  Patient presents with   Consult    ILD consult per Dr. Valeta Harms. Pt states he has been doing okay since last visit and denies any complaints.     HPI Luke [name] 76 y.o. -referred by Dr. June Leap for interstitial lung disease evaluation   [place] Integrated Comprehensive ILD Questionnaire  Symptoms:  -Reports insidious onset of shortness of breath gradually.  It is the same since it started.  He is unclear how long he is headed but he had it for a few years.  There is no cough at all.  There is no clearing of the throat.  There is no fatigue.  His appetite is good no nausea no vomiting no diarrhea no anxiety no depression no chronic pain    Past Medical History :   In 2009 he was diagnosed with systolic heart failure.  He believes etiology is ischemic based on a remote heart attack.  Has a history of diabetes for several years.  Has kidney disease not otherwise specified for several years.  His GFR in May 2022 was greater than 60 with a creatinine 1.23 mg percent.   His last echocardiogram was in 2019.  He sees cardiology Dr. Caryl Comes and Dr. Candee Furbish.  Overall he is deemed to be stable.  He had pacemaker check recently.  Denies any  collagen vascular disease or vasculitis sleep apnea.  Denies any PE.  He has had COVID-vaccine but has not had COVID. ROS:   -Positive for fatigue.  Has some dysphagia for the last few days but otherwise okay.  No nausea no vomiting no heartburn no snoring no rash no ulcers.   FAMILY HISTORY of LUNG DISEASE: Denies   EXPOSURE HISTORY: Smokes cigarettes 20 1966 in 2012 30 cigarettes/day.  He did have some passive smoking.  Did smoke marijuana between 1968 and 1970.  Once a month.  No cocaine use no intravenous drug use   HOME and HOBBY DETAILS : [place] the last 25 years the age of the home is 50 years.  There is no dampness.  No mildew no mold.  No humidifier use no CPAP use no nebulizer use.  No steam iron use no Jacuzzi use.  No misting [place] no pet birds or parakeets no pet gerbils.  No mold in the [facility] duct.  Last checked in 2009.  Does do gardening.  No birds at this no strong mats no water damage no Jacuzzi.   OCCUPATIONAL HISTORY (122 questions) : He was in [place] and got exposed to agent orange but otherwise detailed questioning is negative other than the fact he worked with some petroleum based cleaning agents.   PULMONARY TOXICITY HISTORY (27 items): He was on hydralazine in 2019.   HRCT May 2022 -  personally visualzied -definite progression is combined emphysema with UIP features.  IMPRESSION: 1. Spectrum of findings compatible with basilar predominant fibrotic interstitial lung disease without frank honeycombing, with mild progression at the lung bases since 03/25/2018 CT abdomen study, with more clear progression since 10/07/2011 CT abdomen study. Findings are categorized as probable UIP per consensus guidelines: Diagnosis of Idiopathic Pulmonary Fibrosis: An Official ATS/ERS/JRS/ALAT Clinical Practice Guideline. [place], Iss 5, [id number], Jan 04 2017. 2. Scattered small solid pulmonary nodules, largest 5 mm.  Follow-up noncontrast chest CT recommended in 12 months. This recommendation follows the consensus statement: Guidelines for Management of Incidental Pulmonary Nodules Detected on CT Images: From the Fleischner Society 2017; Radiology 2017; 284:228-243. 3. Three-vessel coronary atherosclerosis. 4. Chronic atrophy of pancreatic body and tail, progressive since 2019 CT abdomen study, without discrete mass on this noncontrast CT, indeterminate but presumably due to progressive chronic pancreatitis. 5. Aortic Atherosclerosis (ICD10-I70.0) and Emphysema (ICD10-J43.9).     Electronically Signed   By: Ilona Sorrel M.D.   On: 10/03/2020 16:53  SErology  Results for [name], [name]" (MRN [id number]) as of 01/02/2021 14:44  Ref. Range 11/13/2020 16:09  Anti Nuclear Antibody (ANA) Latest Ref Range: NEGATIVE  NEGATIVE  Angiotensin-Converting Enzyme Latest Ref Range: 9 - 67 U/L 27  Cyclic Citrullin Peptide Ab Latest Units: UNITS <16  ds DNA Ab Latest Units: IU/mL <1  Myeloperoxidase Abs Latest Units: AI <1.0  Serine Protease 3 Latest Units: AI <1.0  RA Latex Turbid. Latest Ref Range: <14 IU/mL <14  SSA (Ro) (ENA) Antibody, IgG Latest Ref Range: <1.0 NEG AI <1.0 NEG  SSB (La) (ENA) Antibody, IgG Latest Ref Range: <1.0 NEG AI <1.0 NEG  Scleroderma (Scl-70) (ENA) Antibody, IgG Latest Ref Range: <1.0 NEG AI <1.0 NEG    eCHO arch 2019  Study Conclusions   - Left ventricle: The cavity size was normal. Wall thickness was    normal. Systolic function was normal. The estimated ejection    fraction was in the range of 55% to 60%. Wall motion was normal;    there were no regional wall motion abnormalities. Doppler    parameters are consistent with abnormal left ventricular    relaxation (grade 1 diastolic dysfunction).  - Mitral valve: There was mild regurgitation.  - Pulmonary arteries: Systolic pressure was moderately increased.    PA peak pressure: 40 mm Hg (S).     OV 02/14/2021 -  telephine visit  Subjective:  Patient ID: Luke [name], male , DOB: 05-04-1946 , age 73 y.o. , MRN: [id number] , ADDRESS: [place] [address] PCP Tonia Ghent, MD Patient Care Team: Tonia Ghent, MD as PCP - General Angelia Mould, MD (Vascular Surgery) Arta Silence, MD (Gastroenterology) Michael Boston, MD (General Surgery) Eustace Moore, MD (Neurosurgery) Juluis Rainier [facility]) Jerline Pain, MD (Cardiology) [name], [facility] as Pharmacist (Pharmacist)   Type of visit: Telephone/Video Circumstance: COVID-19 national emergency Identification of patient Luke [name] with 12-10-1945 and MRN [id number] - 2 person identifier Risks: Risks, benefits, limitations of telephone visit explained. Patient understood and verbalized agreement to proceed Anyone else on call: just patient Patient location: [phone]  This provider location: [place], [employer]  This Provider for this visit: Treatment Team:  Attending Provider: Brand Males, MD    02/14/2021 -  FU IPF with some emphysema. Has sCHF  HPI Luke [name] 76 y.o. -telephone visit to  see how his esbriet started is going butt turns out he says he never got referred to pharmacist. Pharmacist confirmed that she is yet to meet iwht him. No esbriet started. Says even spirivan not started. Also, supposed to be telephone visit but got confusing call and he showed up at front desk only to be sent home. Apologized for gap in customer service. Overall well. He said after last vsit he read more about esbriet and got apprehensive esp in light of CKD but lab review shows GFR > 60 this year. 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  Instructions   - Slowly increase the dose per protocol  -Always take it with food  -Any nausea you can try ginger capsules  -Give at least between 5 and 6  hours between dosing  -Good idea to participate in [place] sponsored medication support program - this is optional  -Definitely apply sunscreen when you go out with this medication  - You will need monthly liver tests for 6 months and thereafter every 3 months  - explained in decreaesd renal functio that side ffects could be higher but his GFR is good enough for full dose  - explained benefit of slowing fibrosis progression and that NNT of 1 to 6 is pretty good   We resolved we will jsu start and titrate up to max dose of 2 pills tid which is 2/3rd normal dose   CT Chest data  No results found.  Results for ELVEN, LABOY" (MRN [id number]) as of 02/14/2021 10:42  Ref. Range 10/23/2020 10:57  Creatinine Latest Ref Range: 0.76 - 1.27 mg/dL 1.17  Results for GRIFFON, HERBERG" (MRN [id number]) as of 02/14/2021 10:42  Ref. Range 01/02/2021 15:40  AST Latest Ref Range: 0 - 37 U/L 17  ALT Latest Ref Range: 0 - 53 U/L 16    PFT  No flowsheet data found.   OV 05/10/2021  Subjective:  Patient ID: Luke [name], male , DOB: [birth year] , age 36 y.o. , MRN: [id number] , ADDRESS: [place] [address] PCP Tonia Ghent, MD Patient Care Team: Tonia Ghent, MD as PCP - General Angelia Mould, MD (Vascular Surgery) Arta Silence, MD (Gastroenterology) Michael Boston, MD (General Surgery) Eustace Moore, MD (Neurosurgery) Juluis Rainier (Optometry) Jerline Pain, MD (Cardiology) [name], [facility] as Pharmacist (Pharmacist)  This Provider for this visit: Treatment Team:  Attending Provider: Brand Males, MD    05/10/2021 -   Chief Complaint  Patient presents with   Follow-up    PFT performed today.  Pt states he has been doing okay since last visit and denies any complaints.   02/14/2021 -  FU IPF with some emphysema. Has sCHF = - esbriet shipment at his home 03/23/21  - on esbiret 2 pills tid due to CKD hx  (ofev 2nd choice due to his heart issues)     HPI Luke [name] 76 y.o. -presents for follow-up.  Since his last visit he started pirfenidone.  After starting pirfenidone he developed COVID-19.  In the midst of all this he had to hold his COVID.  He also had diarrhea.  It was not fully clear in November 2022 whether the diarrhea was from [place] or from pirfenidone.  Nevertheless sometime around Thanksgiving he restarted pirfenidone.  He slowly escalated himself within a week to 2 pills 3 times  daily.  He is by choice at 2 pills 3 times daily because of a history of chronic kidney disease although most recent creatinine and GFR were greater than 60.  He says currently is doing 2 pills 3 times daily [submaximal but still therapeutic effect] and is tolerating this well without any GI side effects.  He does have chronic dry skin with flaking.  He says it is refractory.  We discussed about trying flaxseed.  He will do that.  We reviewed his first pulmonary function test.  Between the [facility] and DLCO when averaged he has 60% lung capacity.  He is doing Spiriva but is not sure it is helping him.  His most recent liver function test was 1 month ago.  He will have another 1 today.      SYMPTOM SCALE - ILD 01/02/2021  05/10/2021 191#  O2 use ra ra  Shortness of Breath 0 -> 5 scale with 5 being worst (score 6 If unable to do)   At rest 0 0  Simple tasks - showers, clothes change, eating, shaving 0 0  Household (dishes, doing bed, laundry) 0 0  Shopping 0 0  Walking level at own pace 0 0  Walking up Stairs 2 1  Total (30-36) Dyspnea Score 2 1  How bad is your cough? 0 0  How bad is your fatigue 00 0  How bad is nausea 0 0  How bad is vomiting?  0 0  How bad is diarrhea? 00 0  How bad is anxiety? 0 0  How bad is depression 0 00        Simple office walk 185 feet x  3 laps goal with forehead probe 01/02/2021    O2 used ra   Number laps completed 3   Comments about pace avg   Resting Pulse  Ox/HR 97% and 71/min   Final Pulse Ox/HR 95% and 94/min   Desaturated </= 88% no   Desaturated <= 3% points no   Got Tachycardic >/= 90/min yes   Symptoms at end of test none   Miscellaneous comments x    CT Chest data  No results found.    PFT  PFT Results Latest Ref Rng & Units 05/10/2021  FVC-Pre L 3.63  FVC-Predicted Pre % 78  Pre FEV1/FVC % % 70  FEV1-Pre L 2.55  FEV1-Predicted Pre % 75  DLCO uncorrected ml/min/mmHg 11.58  DLCO UNC% % 43  DLCO corrected ml/min/mmHg 11.36  DLCO COR %Predicted % 42  DLVA Predicted % 56       has a past medical history of AAA (abdominal aortic aneurysm) (2011), Anxiety, Atrial fibrillation ([place]), Automatic implantable cardioverter-defibrillator in situ, CAD (coronary artery disease), Cardiomyopathy ([place]), Diabetes mellitus, Drug therapy, Ejection fraction < 50%, Ejection fraction < 50%, Fall due to ice or snow (Feb.  19, 2015), HLD (hyperlipidemia), Hypertension, ICD (implantable cardioverter-defibrillator) in place, LBBB (left bundle branch block), Low testosterone, Myocardial infarction [facility]), Pacemaker, PAD (peripheral artery disease) ([place]), and Pancreatitis.   reports that he quit smoking about 17 years ago. His smoking use included cigarettes. He started smoking about 59 years ago. He has a 80.00 pack-year smoking history. He has never used smokeless tobacco.  Past Surgical History:  Procedure Laterality Date   ABDOMINAL AORTAGRAM N/A 10/28/2011   Procedure: ABDOMINAL [name];  Surgeon: Angelia Mould, MD;  Location: [facility] CATH LAB;  Service: Cardiovascular;  Laterality: N/A;   ABDOMINAL AORTIC ANEURYSM REPAIR     EVAR  BI-VENTRICULAR IMPLANTABLE CARDIOVERTER DEFIBRILLATOR N/A 07/16/2012   Procedure: BI-VENTRICULAR IMPLANTABLE CARDIOVERTER DEFIBRILLATOR  (CRT-D);  Surgeon: Deboraha Sprang, MD;  Location: [facility] CATH LAB;  Service: Cardiovascular;  Laterality: N/A;   BIV ICD GENERATOR CHANGEOUT N/A 10/25/2020   Procedure: BIV ICD  GENERATOR CHANGEOUT;  Surgeon: Deboraha Sprang, MD;  Location: [facility] CV LAB;  Service: Cardiovascular;  Laterality: N/A;   CARDIAC CATHETERIZATION     COLONOSCOPY  03/23/2018; 2020   ESOPHAGOGASTRODUODENOSCOPY N/A 02/12/2019   Procedure: ESOPHAGOGASTRODUODENOSCOPY (EGD);  Surgeon: Jackquline Denmark, MD;  Location: [facility] ENDOSCOPY;  Service: Endoscopy;  Laterality: N/A;   ESOPHAGOGASTRODUODENOSCOPY (EGD) WITH PROPOFOL N/A 02/05/2018   Procedure: ESOPHAGOGASTRODUODENOSCOPY (EGD) WITH PROPOFOL;  Surgeon: Mauri Pole, MD;  Location: [facility] ENDOSCOPY;  Service: Endoscopy;  Laterality: N/A;   ESOPHAGOGASTRODUODENOSCOPY (EGD) WITH PROPOFOL N/A 09/27/2018   Procedure: ESOPHAGOGASTRODUODENOSCOPY (EGD) WITH PROPOFOL;  Surgeon: Carol Ada, MD;  Location: [place];  Service: Endoscopy;  Laterality: N/A;   FOREIGN BODY REMOVAL  09/27/2018   Procedure: FOREIGN BODY REMOVAL;  Surgeon: Carol Ada, MD;  Location: [facility] ENDOSCOPY;  Service: Endoscopy;;   FOREIGN BODY REMOVAL  02/12/2019   Procedure: FOREIGN BODY REMOVAL;  Surgeon: Jackquline Denmark, MD;  Location: [facility] ENDOSCOPY;  Service: Endoscopy;;   HERNIA REPAIR  Jan. 9, 2015   INSERTION OF MESH N/A 05/14/2013   Procedure: INSERTION OF MESH;  Surgeon: Adin Hector, MD;  Location: [place];  Service: General;  Laterality: N/A;   LOWER EXTREMITY ANGIOGRAM Bilateral 10/28/2011   Procedure: LOWER EXTREMITY ANGIOGRAM;  Surgeon: Angelia Mould, MD;  Location[facility] CATH LAB;  Service: Cardiovascular;  Laterality: Bilateral;   PACEMAKER INSERTION  07-16-12   pacemaker/defibrilator   POLYPECTOMY     POSTERIOR CERVICAL FUSION/FORAMINOTOMY N/A 06/09/2014   Procedure: Laminectomy - Cervical two-Cervcial four posterior cervical instrumented fusion Cervical two-cervical four;  Surgeon: Eustace Moore, MD;  Location: [place] NEURO ORS;  Service: Neurosurgery;  Laterality: N/A;  posterior    SAVORY DILATION N/A 02/05/2018   Procedure: SAVORY DILATION;  Surgeon: Mauri Pole, MD;  Location: [facility] ENDOSCOPY;  Service: Endoscopy;  Laterality: N/A;   TONSILLECTOMY     as a child    UMBILICAL HERNIA REPAIR N/A 05/14/2013   Procedure: LAPAROSCOPIC exploration and repair of hernia in abdominal ;  Surgeon: Adin Hector, MD;  Location: [place];  Service: General;  Laterality: N/A;   UPPER GASTROINTESTINAL ENDOSCOPY  2020    Allergies  Allergen Reactions   Aldactone [Spironolactone] Other (See Comments)    Hyperkalemia   Codeine Rash and Hives   Atorvastatin     Myalgias with lipitor.  Does tolerate simvastatin.     Januvia [Sitagliptin] Other (See Comments)    Diarrhea and heart racing   Jardiance [Empagliflozin]     polyuria   Lisinopril     Possible cause of pancreatitis    Immunization History  Administered Date(s) Administered   Fluad Quad(high Dose 65+) 02/23/2020, 04/13/2021   Influenza Split 01/31/2011, 02/11/2012   Influenza Whole 02/27/2010   Influenza,inj,Quad PF,6+ Mos 03/18/2013, 03/25/2014, 01/19/2015, 02/21/2016, 02/27/2017, 02/12/2018, 02/18/2019   Influenza-Unspecified 03/03/2020   PFIZER(Purple Top)SARS-COV-2 Vaccination 05/23/2019, 06/12/2019   Pneumococcal Conjugate-13 10/18/2014   Pneumococcal Polysaccharide-23 11/14/2011   Td 07/05/2010    Family History  Problem Relation Age of Onset   Hypertension Mother    Stroke Mother    Hyperlipidemia Mother    Diabetes Sister    Heart disease Sister  Before age 42   Hypertension Sister    Hyperlipidemia Sister    Heart attack Sister    Lung cancer Father    Hypertension Son    Colon cancer Neg Hx    Prostate cancer Neg Hx    Esophageal cancer Neg Hx    Stomach cancer Neg Hx    Rectal cancer Neg Hx    Colon polyps Neg Hx      Current Outpatient Medications:    albuterol (VENTOLIN HFA) 108 (90 Base) MCG/ACT inhaler, Inhale 2 puffs into the lungs every 6 (six) hours as needed for wheezing or shortness of breath., Disp: 8 g, Rfl: 6   amLODipine (NORVASC) 10 MG tablet, TAKE 1  TABLET(10 MG) BY MOUTH DAILY, Disp: 90 tablet, Rfl: 3   aspirin EC 81 MG tablet, Take 81 mg by mouth daily., Disp: , Rfl:    carvedilol (COREG) 25 MG tablet, TAKE 1 TABLET BY MOUTH TWICE DAILY WITH MEALS, Disp: 180 tablet, Rfl: 3   Cholecalciferol (VITAMIN D PO), Take 2,500 Units by mouth daily., Disp: , Rfl:    Cyanocobalamin 2500 MCG CHEW, Chew 2,500 mcg by mouth daily., Disp: , Rfl:    glipiZIDE (GLUCOTROL) 5 MG tablet, TAKE 1 TABLET(5 MG) BY MOUTH TWICE DAILY BEFORE A MEAL, Disp: 180 tablet, Rfl: 1   glucose blood (ACCU-CHEK AVIVA PLUS) test strip, USE TO CHECK BLOOD GLUCOSE UP TO TWICE DAILY., Disp: 200 strip, Rfl: 3   hydrALAZINE (APRESOLINE) 25 MG tablet, TAKE 1 TABLET(25 MG) BY MOUTH THREE TIMES DAILY, Disp: 270 tablet, Rfl: 1   insulin glargine (LANTUS SOLOSTAR) 100 UNIT/ML Solostar Pen, INJECT 0.3 TO 0.35 MLS(30 TO 35 UNITS) INTO THE SKIN EVERY DAY, Disp: 15 mL, Rfl: 3   metFORMIN (GLUCOPHAGE) 500 MG tablet, TAKE 2 TABLETS BY MOUTH TWICE DAILY WITH FOOD, Disp: 360 tablet, Rfl: 3   Multiple Vitamin (MULTIVITAMIN) tablet, Take 1 tablet by mouth daily., Disp: , Rfl:    omeprazole (PRILOSEC) 40 MG capsule, TAKE 1 CAPSULE(40 MG) BY MOUTH TWICE DAILY, Disp: 180 capsule, Rfl: 0   Pirfenidone (ESBRIET) 267 MG TABS, 2 tabs three times daily., Disp: 138 tablet, Rfl: 0   sildenafil (REVATIO) 20 MG tablet, TAKE 3 TO 4 TABLETS BY MOUTH DAILY AS NEEDED, Disp: 50 tablet, Rfl: 11   simvastatin (ZOCOR) 40 MG tablet, Take 1 tablet (40 mg total) by mouth at bedtime. Has tolerated even with amlodipine use.  He didn't tolerate atorvastatin., Disp: 90 tablet, Rfl: 3   umeclidinium bromide (INCRUSE ELLIPTA) 62.5 MCG/INH AEPB, Inhale 1 puff into the lungs daily., Disp: 30 each, Rfl: 5      Objective:   Vitals:   05/10/21 1351  BP: 130/72  Pulse: 73  Temp: 98 F (36.7 C)  TempSrc: Oral  SpO2: 94%  Weight: 191 lb (86.6 kg)  Height: 6' (1.829 m)    Estimated body mass index is 25.9 kg/m as  calculated from the following:   Height as of this encounter: 6' (1.829 m).   Weight as of this encounter: 191 lb (86.6 kg).  @WEIGHTCHANGE @  Filed Weights   05/10/21 1351  Weight: 191 lb (86.6 kg)     Physical Exam  General: No distress. Looks well Neuro: Alert and Oriented x 3. GCS 15. Speech normal Psych: Pleasant Resp:  Barrel Chest - no.  Wheeze - no, Crackles - yes, No overt respiratory distress CVS: Normal heart sounds. Murmurs - no Ext: Stigmata of Connective Tissue Disease - no HEENT: Normal  upper airway. PEERL +. No post nasal drip        Assessment:       ICD-10-CM   1. IPF (idiopathic pulmonary fibrosis) (HCC)  J84.112 Hepatic function panel    2. Medication monitoring encounter  Z51.81 Hepatic function panel    3. 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[place])  J43.9    J84.10     4. Centrilobular emphysema (HCC)  J43.2 Alpha-1 antitrypsin phenotype         Plan:     Patient Instructions     ICD-10-CM   1. IPF (idiopathic pulmonary fibrosis) (HCC)  J84.112 Hepatic function panel    Hepatic function panel    2. Centrilobular emphysema ([place])  J43.2     3. 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HCC)  J43.9    J84.10      You have emphysema -  -continue spiriva respimat 2 puff daily  - continue  albuterol prn - check alpha 1  AT phenotype blood work 05/10/2021   Re IPF  - subjectively stable  - tolerating esbriet well at 2 pills three times daily after initial side effect of diarrhea  Plan Check LFT 05/10/2021 and monthly for next 3 months Check BMET in 3 months [place] ESBRIET  at 2 pill three times daily with food, 6h apart and wear sunscreen  - at next visit if kidneys  holding okay then we can aim to increase to full dose 3 pills three times daily  Chronic Dry Skin  Plan  - flax seed  Followup - return to see r Dr Chase Caller in  12  weeks but after BMET/LFT check    = symptoms score and walk test at followup - 30 MIN  VISIT    SIGNATURE    Dr. Brand Males, M.D., F.C.C.P,  Pulmonary and Critical Care Medicine Staff Physician, [place] Director - Interstitial Lung Disease  Program  Pulmonary [place] at [place], [place], [address]  Pager: [phone], If no answer or between  15:00h - 7:00h: call [phone] Telephone: [phone]  2:17 PM 05/10/2021

## 2021-05-10 NOTE — Patient Instructions (Signed)
Spirometry/DLCO performed today. 

## 2021-05-10 NOTE — Progress Notes (Addendum)
Called patient to let him know his patient assistance form has been faxed to [employer]. Advised patient that I will let him know as soon as we find out a decision. I will follow up with [employer] on Friday, 05/18/2021.   [name], CPP notified  Marijean Niemann, [place] Clinical Pharmacy Assistant [phone]  Time Spent:  5 Minutes

## 2021-05-11 LAB — HEPATIC FUNCTION PANEL
ALT: 14 U/L (ref 0–53)
AST: 12 U/L (ref 0–37)
Albumin: 4.3 g/dL (ref 3.5–5.2)
Alkaline Phosphatase: 67 U/L (ref 39–117)
Bilirubin, Direct: 0.1 mg/dL (ref 0.0–0.3)
Total Bilirubin: 0.3 mg/dL (ref 0.2–1.2)
Total Protein: 7 g/dL (ref 6.0–8.3)

## 2021-05-18 ENCOUNTER — Telehealth: Payer: Self-pay

## 2021-05-18 LAB — ALPHA-1 ANTITRYPSIN PHENOTYPE: A-1 Antitrypsin, Ser: 141 mg/dL (ref 83–199)

## 2021-05-18 NOTE — Progress Notes (Addendum)
° ° °  Chronic Care Management Pharmacy Assistant   Name: Luke [name]  MRN: [id number] DOB: [birth year]  Reason for Encounter: CCM (Lantus Patient Assistance Update)  05/18/2021 - Called [employer] for an update on patient's assistance forms for Lantus. [employer] has received the forms, but (due to re-enrollment) processing is taking longer than normal. I was advised to call back on Wednesday, 05/23/21 for an update.  Time spent: 15 Minutes  05/10/2021 - Called patient to let him know his patient assistance form has been faxed to [employer]. Advised patient that I will let him know as soon as we find out a decision. I will follow up with [employer] on Friday, 05/18/2021. Time Spent 5 minutes   [name], CPP notified   Marijean Niemann, [place] Clinical Pharmacy Assistant [phone]

## 2021-05-22 NOTE — Progress Notes (Signed)
Called patient to inform him that [employer] requested I call back tomorrow for an update on his Lantus patient assistance form. I spoke with patient's wife. I let her know I would call them back as soon as I spoke with [employer]. Patient's wife understood.   [name], CPP notified  Marijean Niemann, [place] Clinical Pharmacy Assistant [phone]  Time Spent:  3 Minutes

## 2021-05-22 NOTE — Telephone Encounter (Signed)
Patient called for an update. Let him know we were planning to call back tomorrow for status. He would like a call tomorrow with more information since he is running low.

## 2021-05-25 NOTE — Progress Notes (Signed)
Called [employer] to check on the status of patient's forms for Lantus. [employer] states that the forms are still in processing for the patient. Representative could see that they have everything they need. Advised I call back on 01/27 for status.  [name], CPP notified  Marijean Niemann, [place] Clinical Pharmacy Assistant [phone]  Time Spent: 6 Minutes

## 2021-05-30 DIAGNOSIS — H43821 Vitreomacular adhesion, right eye: Secondary | ICD-10-CM | POA: Diagnosis not present

## 2021-05-30 DIAGNOSIS — H43392 Other vitreous opacities, left eye: Secondary | ICD-10-CM | POA: Diagnosis not present

## 2021-05-30 DIAGNOSIS — H3562 Retinal hemorrhage, left eye: Secondary | ICD-10-CM | POA: Diagnosis not present

## 2021-05-30 DIAGNOSIS — H4312 Vitreous hemorrhage, left eye: Secondary | ICD-10-CM | POA: Diagnosis not present

## 2021-05-30 DIAGNOSIS — E113593 Type 2 diabetes mellitus with proliferative diabetic retinopathy without macular edema, bilateral: Secondary | ICD-10-CM | POA: Diagnosis not present

## 2021-05-30 DIAGNOSIS — E119 Type 2 diabetes mellitus without complications: Secondary | ICD-10-CM | POA: Diagnosis not present

## 2021-05-30 DIAGNOSIS — H43812 Vitreous degeneration, left eye: Secondary | ICD-10-CM | POA: Diagnosis not present

## 2021-05-30 DIAGNOSIS — H35431 Paving stone degeneration of retina, right eye: Secondary | ICD-10-CM | POA: Diagnosis not present

## 2021-05-30 NOTE — Telephone Encounter (Signed)
Patient left voicemail requesting update as he is running low on Lantus. Contacted [employer], patient approved May 29, 2021 through May 05, 2022. It should arrive to office in 5-7 business days.  Contacted patient to update on above.

## 2021-06-05 DIAGNOSIS — H2512 Age-related nuclear cataract, left eye: Secondary | ICD-10-CM | POA: Diagnosis not present

## 2021-06-05 DIAGNOSIS — H25013 Cortical age-related cataract, bilateral: Secondary | ICD-10-CM | POA: Diagnosis not present

## 2021-06-05 DIAGNOSIS — E113592 Type 2 diabetes mellitus with proliferative diabetic retinopathy without macular edema, left eye: Secondary | ICD-10-CM | POA: Diagnosis not present

## 2021-06-05 DIAGNOSIS — H25043 Posterior subcapsular polar age-related cataract, bilateral: Secondary | ICD-10-CM | POA: Diagnosis not present

## 2021-06-05 DIAGNOSIS — H2513 Age-related nuclear cataract, bilateral: Secondary | ICD-10-CM | POA: Diagnosis not present

## 2021-06-05 DIAGNOSIS — H4312 Vitreous hemorrhage, left eye: Secondary | ICD-10-CM | POA: Diagnosis not present

## 2021-06-11 DIAGNOSIS — H2512 Age-related nuclear cataract, left eye: Secondary | ICD-10-CM | POA: Diagnosis not present

## 2021-06-11 DIAGNOSIS — E113512 Type 2 diabetes mellitus with proliferative diabetic retinopathy with macular edema, left eye: Secondary | ICD-10-CM | POA: Diagnosis not present

## 2021-06-11 DIAGNOSIS — H4312 Vitreous hemorrhage, left eye: Secondary | ICD-10-CM | POA: Diagnosis not present

## 2021-06-12 DIAGNOSIS — E113592 Type 2 diabetes mellitus with proliferative diabetic retinopathy without macular edema, left eye: Secondary | ICD-10-CM | POA: Diagnosis not present

## 2021-06-13 ENCOUNTER — Other Ambulatory Visit: Payer: Self-pay | Admitting: Gastroenterology

## 2021-06-19 DIAGNOSIS — H43821 Vitreomacular adhesion, right eye: Secondary | ICD-10-CM | POA: Diagnosis not present

## 2021-06-19 DIAGNOSIS — E113593 Type 2 diabetes mellitus with proliferative diabetic retinopathy without macular edema, bilateral: Secondary | ICD-10-CM | POA: Diagnosis not present

## 2021-06-19 DIAGNOSIS — H35372 Puckering of macula, left eye: Secondary | ICD-10-CM | POA: Diagnosis not present

## 2021-06-19 DIAGNOSIS — H31093 Other chorioretinal scars, bilateral: Secondary | ICD-10-CM | POA: Diagnosis not present

## 2021-07-02 ENCOUNTER — Other Ambulatory Visit: Payer: Self-pay | Admitting: Family Medicine

## 2021-07-02 DIAGNOSIS — Z20822 Contact with and (suspected) exposure to covid-19: Secondary | ICD-10-CM | POA: Diagnosis not present

## 2021-07-09 ENCOUNTER — Telehealth: Payer: Self-pay | Admitting: Cardiology

## 2021-07-09 NOTE — Telephone Encounter (Signed)
No current CP.  Offered appt with [facility] fo 3/7 however pt not able to come in tomorrow.  Scheduled for 3/8 but pt understanding to contact the office or call 911 if s/s return.

## 2021-07-09 NOTE — Telephone Encounter (Signed)
Pt sent this via Mychart to sching pool:   ? ? ?1. Not at this time ?2. No other symptons  ?3. About three weeks ?4. Comes and goes after stress (20 minutes on the tread mill) ?5. No ? ?I have recently been diagnosed with idiopathic pulmonary fibrosis. I am not sure that my chest pains are not related to this condition. I would like to have my heart checked to make sure that my heart is ok. ? ? ? ?Good Morning Luke [name],  ?Can you tell me a little more about your chest pain.  I will need to get this info over to one of our triage nurse ? ? ?1. Are you having CP right now?  ? ?2. Are you experiencing any other symptoms (ex. SOB, nausea, vomiting, sweating)?  ? ?3. How long have you been experiencing CP?  ? ?4. Is your CP continuous or coming and going?  ? ?5. Have you taken Nitroglycerin?  ??  ? ? ? ? ?March 8th would be great. Please give me a time. ? ? ?----- Message ----- ?     From:[name], Luke Sage ?     Sent:07/06/2021  2:18 PM EST ?       NI:DPOEUMP L Vastine "Luke [name]" ?  Subject:Appointment Request ? ?Good afternoon, ? ?I do not have anything within the dates you requested. Can you do 07/11/21? I have quite a few openings that day ? ? ?----- Message ----- ?     From:Luke [name] "Luke [name]" ?     Sent:07/06/2021  1:53 PM EST ?       NT:IRWE Marlou Porch, MD ?  Subject:Appointment Request ? ?Appointment Request From: Luke [name] ? ?With Provider: Candee Furbish, MD [place] ? ?Preferred Date Range: 07/13/2021 - 08/02/2021 ? ?Preferred Times: Any Time ? ?Reason for visit: Office Visit ? ?Comments: ?6 month check up. slight chest pain on and off. ?

## 2021-07-10 DIAGNOSIS — E113593 Type 2 diabetes mellitus with proliferative diabetic retinopathy without macular edema, bilateral: Secondary | ICD-10-CM | POA: Diagnosis not present

## 2021-07-11 ENCOUNTER — Ambulatory Visit (INDEPENDENT_AMBULATORY_CARE_PROVIDER_SITE_OTHER): Payer: Medicare Other | Admitting: Cardiology

## 2021-07-11 ENCOUNTER — Other Ambulatory Visit: Payer: Self-pay

## 2021-07-11 ENCOUNTER — Encounter: Payer: Self-pay | Admitting: Cardiology

## 2021-07-11 VITALS — BP 154/90 | HR 63 | Ht 74.0 in | Wt 189.0 lb

## 2021-07-11 DIAGNOSIS — Z95810 Presence of automatic (implantable) cardiac defibrillator: Secondary | ICD-10-CM

## 2021-07-11 DIAGNOSIS — I442 Atrioventricular block, complete: Secondary | ICD-10-CM | POA: Diagnosis not present

## 2021-07-11 DIAGNOSIS — Z01812 Encounter for preprocedural laboratory examination: Secondary | ICD-10-CM

## 2021-07-11 DIAGNOSIS — R072 Precordial pain: Secondary | ICD-10-CM | POA: Diagnosis not present

## 2021-07-11 DIAGNOSIS — J84112 Idiopathic pulmonary fibrosis: Secondary | ICD-10-CM

## 2021-07-11 DIAGNOSIS — I25118 Atherosclerotic heart disease of native coronary artery with other forms of angina pectoris: Secondary | ICD-10-CM

## 2021-07-11 DIAGNOSIS — I5022 Chronic systolic (congestive) heart failure: Secondary | ICD-10-CM | POA: Diagnosis not present

## 2021-07-11 LAB — BASIC METABOLIC PANEL
BUN/Creatinine Ratio: 17 (ref 10–24)
BUN: 19 mg/dL (ref 8–27)
CO2: 23 mmol/L (ref 20–29)
Calcium: 9.9 mg/dL (ref 8.6–10.2)
Chloride: 97 mmol/L (ref 96–106)
Creatinine, Ser: 1.09 mg/dL (ref 0.76–1.27)
Glucose: 282 mg/dL — ABNORMAL HIGH (ref 70–99)
Potassium: 4.9 mmol/L (ref 3.5–5.2)
Sodium: 135 mmol/L (ref 134–144)
eGFR: 71 mL/min/{1.73_m2} (ref >59)

## 2021-07-11 LAB — CBC
Hematocrit: 42.3 % (ref 37.5–51.0)
Hemoglobin: 14.7 g/dL (ref 13.0–17.7)
MCH: 33.3 pg — ABNORMAL HIGH (ref 26.6–33.0)
MCHC: 34.8 g/dL (ref 31.5–35.7)
MCV: 96 fL (ref 79–97)
Platelets: 293 10*3/uL (ref 150–450)
RBC: 4.41 x10E6/uL (ref 4.14–5.80)
RDW: 11.8 % (ref 11.6–15.4)
WBC: 10.1 10*3/uL (ref 3.4–10.8)

## 2021-07-11 MED ORDER — ISOSORBIDE MONONITRATE ER 30 MG PO TB24: 30.0000 mg | ORAL_TABLET | Freq: Every day | ORAL | 3 refills | Status: DC

## 2021-07-11 NOTE — Assessment & Plan Note (Signed)
ICD/CRT with recent replacement June 2022, Dr. Caryl Comes.  Working well. ?

## 2021-07-11 NOTE — Assessment & Plan Note (Signed)
Pulmonary notes reviewed.

## 2021-07-11 NOTE — Assessment & Plan Note (Addendum)
Previously had heart catheterization October 15, 2011 with moderate coronary artery disease no PCI at that time.  Had a large anteroseptal MI and inferior MI previously.  ICD in place.  Given his current symptoms of chest discomfort upon stress, I think it makes sense for [place] to perform left heart catheterization with possible percutaneous intervention.  His coronary artery disease may have progressed.  Most recent ejection fraction is reassuringly normal.  We will give him isosorbide 30 mg as well for antianginal support in addition to his amlodipine that he takes as well as his carvedilol. ?

## 2021-07-11 NOTE — Assessment & Plan Note (Signed)
Dr. Caryl Comes monitoring. ?

## 2021-07-11 NOTE — Progress Notes (Signed)
Cardiology Office Note:    Date:  07/11/2021   ID:  Luke [name], DOB June 21, 1945, MRN [id number]  PCP:  Tonia Ghent, MD   [facility] Providers Cardiologist:  None     Referring MD: Tonia Ghent, MD    History of Present Illness:    Luke [name] is a 76 y.o. male here for the evaluation of chest pain that is been going off and on over the past 3 weeks lasting about 20 minutes after stress such as the treadmill.  He was recently diagnosed with idiopathic pulmonary fibrosis.  He was not sure whether the chest pain is related to this condition.  He wanted to have his heart checked out to make sure things were okay.  He messaged [place] about this condition. Comes and goes. Left arm.   I saw him last on 01/01/2021, has an ischemic cardiomyopathy left bundle branch block CRT ICD Dr. Caryl Comes after an intermittent complete heart block.  He goes by Luke [name].  He is a former Dr. [name] patient.  No mode switches, normal device function.  On CT scan of lungs has had three-vessel coronary atherosclerosis.  He is a [place] vet.  Ione 2013 - 2V CAD Left anterior descending (LAD): focal 70-80% proximal. Long 40% mid vessel. Right coronary artery (RCA): Diffusely diseased. 50-60% mid vessel, 70% distal, 70% at bifurcation of PDA/PLOM.    LV Ejection Fraction: 30%. He underwent catheterization which confirmed severe LV dysfunction with moderate to severe 2 vessel disease not thought to be appropriate for intervention.    Past Medical History:  Diagnosis Date   AAA (abdominal aortic aneurysm) 2011   Per vascular surgery   Anxiety    no med. in use for anxiety but pt. speaks openly of his stress & anxious feelings regarding impending surgery     Atrial fibrillation ([place])    Automatic implantable cardioverter-defibrillator in situ    CAD (coronary artery disease)    Presumed CAD with nuclear scan October 09, 2011,  large anteroseptal MI and inferior MI. Catheterization scheduled October 15, 2011    Cardiomyopathy [facility])    Nuclear, October 09, 2011, EF 30%, multiple focal wall motion abnormalities   Diabetes mellitus    type II   Drug therapy    Hyperkalemia with spironolactone   Ejection fraction < 50%    EF 30%, nuclear, October 09, 2011   Ejection fraction < 50%    EF previously 30%  //   EF 30-35%, echo, July 14, 2012, severe diffuse hypokinesis, PA pressure 43 mm mercury   Fall due to ice or snow Feb.  19, 2015   HLD (hyperlipidemia)    Hypertension    white coat HTN-- often elevated in office and controlled on outside checks.   ICD (implantable cardioverter-defibrillator) in place    CRT-D placed March, 2014 complete heart block and k dysfunction   LBBB (left bundle branch block)    LBBB on EKG October 11, 2011,  no prior EKG has been done   Low testosterone    Hx of   Myocardial infarction ([place])    Told in 2015-never knew-showed up on stress test for AAA;    Pacemaker    PAD (peripheral artery disease) ([place])    Pancreatitis     Past Surgical History:  Procedure Laterality Date   ABDOMINAL AORTAGRAM N/A 10/28/2011   Procedure: ABDOMINAL [name];  Surgeon: Angelia Mould, MD;  Location: [facility] CATH LAB;  Service: Cardiovascular;  Laterality:  N/A;   ABDOMINAL AORTIC ANEURYSM REPAIR     EVAR    BI-VENTRICULAR IMPLANTABLE CARDIOVERTER DEFIBRILLATOR N/A 07/16/2012   Procedure: BI-VENTRICULAR IMPLANTABLE CARDIOVERTER DEFIBRILLATOR  (CRT-D);  Surgeon: Deboraha Sprang, MD;  Location: [facility] CATH LAB;  Service: Cardiovascular;  Laterality: N/A;   BIV ICD GENERATOR CHANGEOUT N/A 10/25/2020   Procedure: BIV ICD GENERATOR CHANGEOUT;  Surgeon: Deboraha Sprang, MD;  Location: [place] CV LAB;  Service: Cardiovascular;  Laterality: N/A;   CARDIAC CATHETERIZATION     COLONOSCOPY  03/23/2018; 2020   ESOPHAGOGASTRODUODENOSCOPY N/A 02/12/2019   Procedure: ESOPHAGOGASTRODUODENOSCOPY (EGD);  Surgeon: Jackquline Denmark, MD;  Location: [facility] ENDOSCOPY;  Service: Endoscopy;  Laterality: N/A;    ESOPHAGOGASTRODUODENOSCOPY (EGD) WITH PROPOFOL N/A 02/05/2018   Procedure: ESOPHAGOGASTRODUODENOSCOPY (EGD) WITH PROPOFOL;  Surgeon: Mauri Pole, MD;  Location: [facility] ENDOSCOPY;  Service: Endoscopy;  Laterality: N/A;   ESOPHAGOGASTRODUODENOSCOPY (EGD) WITH PROPOFOL N/A 09/27/2018   Procedure: ESOPHAGOGASTRODUODENOSCOPY (EGD) WITH PROPOFOL;  Surgeon: Carol Ada, MD;  Location: [place];  Service: Endoscopy;  Laterality: N/A;   FOREIGN BODY REMOVAL  09/27/2018   Procedure: FOREIGN BODY REMOVAL;  Surgeon: Carol Ada, MD;  Location: [facility] ENDOSCOPY;  Service: Endoscopy;;   FOREIGN BODY REMOVAL  02/12/2019   Procedure: FOREIGN BODY REMOVAL;  Surgeon: Jackquline Denmark, MD;  Location: [facility] ENDOSCOPY;  Service: Endoscopy;;   HERNIA REPAIR  Jan. 9, 2015   INSERTION OF MESH N/A 05/14/2013   Procedure: INSERTION OF MESH;  Surgeon: Adin Hector, MD;  Location: [place];  Service: General;  Laterality: N/A;   LOWER EXTREMITY ANGIOGRAM Bilateral 10/28/2011   Procedure: LOWER EXTREMITY ANGIOGRAM;  Surgeon: Angelia Mould, MD;  Location: [facility] CATH LAB;  Service: Cardiovascular;  Laterality: Bilateral;   PACEMAKER INSERTION  07-16-12   pacemaker/defibrilator   POLYPECTOMY     POSTERIOR CERVICAL FUSION/FORAMINOTOMY N/A 06/09/2014   Procedure: Laminectomy - Cervical two-Cervcial four posterior cervical instrumented fusion Cervical two-cervical four;  Surgeon: Eustace Moore, MD;  Location: [place] NEURO ORS;  Service: Neurosurgery;  Laterality: N/A;  posterior    SAVORY DILATION N/A 02/05/2018   Procedure: SAVORY DILATION;  Surgeon: Mauri Pole, MD;  Location: [facility] ENDOSCOPY;  Service: Endoscopy;  Laterality: N/A;   TONSILLECTOMY     as a child    UMBILICAL HERNIA REPAIR N/A 05/14/2013   Procedure: LAPAROSCOPIC exploration and repair of hernia in abdominal ;  Surgeon: Adin Hector, MD;  Location: [place];  Service: General;  Laterality: N/A;   UPPER GASTROINTESTINAL ENDOSCOPY  2020    Current  Medications: Current Meds  Medication Sig   albuterol (VENTOLIN HFA) 108 (90 Base) MCG/ACT inhaler Inhale 2 puffs into the lungs every 6 (six) hours as needed for wheezing or shortness of breath.   amLODipine (NORVASC) 10 MG tablet TAKE 1 TABLET(10 MG) BY MOUTH DAILY   aspirin EC 81 MG tablet Take 81 mg by mouth daily.   carvedilol (COREG) 25 MG tablet TAKE 1 TABLET BY MOUTH TWICE DAILY WITH MEALS   Cholecalciferol (VITAMIN D PO) Take 2,500 Units by mouth daily.   Cyanocobalamin 2500 MCG CHEW Chew by mouth daily. Per patient taking 1,000 mcg daily   glipiZIDE (GLUCOTROL) 5 MG tablet TAKE 1 TABLET(5 MG) BY MOUTH TWICE DAILY BEFORE A MEAL   glucose blood (ACCU-CHEK AVIVA PLUS) test strip USE TO CHECK BLOOD GLUCOSE UP TO TWICE DAILY.   hydrALAZINE (APRESOLINE) 25 MG tablet TAKE 1 TABLET(25 MG) BY MOUTH THREE TIMES DAILY   insulin glargine (LANTUS SOLOSTAR)  100 UNIT/ML Solostar Pen INJECT 0.3 TO 0.35 MLS(30 TO 35 UNITS) INTO THE SKIN EVERY DAY   isosorbide mononitrate (IMDUR) 30 MG 24 hr tablet Take 1 tablet (30 mg total) by mouth daily.   metFORMIN (GLUCOPHAGE) 500 MG tablet TAKE 2 TABLETS BY MOUTH TWICE DAILY WITH FOOD   Multiple Vitamin (MULTIVITAMIN) tablet Take 1 tablet by mouth daily.   omeprazole (PRILOSEC) 40 MG capsule TAKE 1 CAPSULE(40 MG) BY MOUTH TWICE DAILY (Patient taking differently: daily. Per patient taking 1 tablet daily)   Pirfenidone (ESBRIET) 267 MG TABS 2 tabs three times daily.   sildenafil (REVATIO) 20 MG tablet TAKE 3 TO 4 TABLETS BY MOUTH DAILY AS NEEDED   simvastatin (ZOCOR) 40 MG tablet Take 1 tablet (40 mg total) by mouth at bedtime. Has tolerated even with amlodipine use.  He didn't tolerate atorvastatin.   umeclidinium bromide (INCRUSE ELLIPTA) 62.5 MCG/INH AEPB Inhale 1 puff into the lungs daily.     Allergies:   Aldactone [spironolactone], Codeine, Atorvastatin, Januvia [sitagliptin], Jardiance [empagliflozin], and Lisinopril   Social History   Socioeconomic  History   Marital status: Married    Spouse name: Patti   Number of children: 1   Years of education: Not on file   Highest education level: Not on file  Occupational History   Occupation: retired  Tobacco Use   Smoking status: Former    Packs/day: 2.00    Years: 40.00    Pack years: 80.00    Types: Cigarettes    Start date: 1964    Quit date: 05/06/2004    Years since quitting: 17.1   Smokeless tobacco: Never  Vaping Use   Vaping Use: Never used  Substance and Sexual Activity   Alcohol use: Yes    Alcohol/week: 0.0 - 2.0 standard drinks    Comment: beer or wine  occassionally   Drug use: No   Sexual activity: Yes    Partners: Female  Other Topics Concern   Not on file  Social History Narrative   [place] vet, he has known service related agent orange exposure.    Retired   Chiropractor daily.     Social Determinants of Health   Financial Resource Strain: Low Risk    Difficulty of Paying Living Expenses: Not very hard  Food Insecurity: Not on file  Transportation Needs: Not on file  Physical Activity: Not on file  Stress: Not on file  Social Connections: Not on file     Family History: The patient's family history includes Diabetes in his sister; Heart attack in his sister; Heart disease in his sister; Hyperlipidemia in his mother and sister; Hypertension in his mother, sister, and son; Lung cancer in his father; Stroke in his mother. There is no history of Colon cancer, Prostate cancer, Esophageal cancer, Stomach cancer, Rectal cancer, or Colon polyps.  ROS:   Please see the history of present illness.    No syncope all other systems reviewed and are negative.  EKGs/Labs/Other Studies Reviewed:    The following studies were reviewed today: Echocardiogram 07/16/2017:  - Left ventricle: The cavity size was normal. Wall thickness was    normal. Systolic function was normal. The estimated ejection    fraction was in the range of 55% to 60%. Wall motion was normal;     there were no regional wall motion abnormalities. Doppler    parameters are consistent with abnormal left ventricular    relaxation (grade 1 diastolic dysfunction).  - Mitral valve: There was mild  regurgitation.  - Pulmonary arteries: Systolic pressure was moderately increased.    PA peak pressure: 40 mm Hg (S).   Echo in 2014 showed EF of 30%  EKG:  EKG is  ordered today.  The ekg ordered today demonstrates sinus rhythm V paced 63 bpm  Recent Labs: 04/13/2021: BUN 24; Creat 1.05; Hemoglobin 15.3; Platelets 270; Potassium 4.7; Sodium 140 05/10/2021: ALT 14  Recent Lipid Panel    Component Value Date/Time   CHOL 147 04/13/2021 1628   TRIG 276 (H) 04/13/2021 1628   HDL 37 (L) 04/13/2021 1628   CHOLHDL 4.0 04/13/2021 1628   VLDL 58.8 (H) 02/16/2020 0806   LDLCALC 74 04/13/2021 1628   LDLDIRECT 62.0 02/16/2020 0806     Risk Assessment/Calculations:              Physical Exam:    VS:  BP (!) 154/90    Pulse 63    Ht '6\' 2"'$  (1.88 m)    Wt 189 lb (85.7 kg)    SpO2 97%    BMI 24.27 kg/m     Wt Readings from Last 3 Encounters:  07/11/21 189 lb (85.7 kg)  05/10/21 191 lb (86.6 kg)  04/13/21 186 lb (84.4 kg)     GEN:  Well nourished, well developed in no acute distress HEENT: Normal NECK: No JVD; No carotid bruits LYMPHATICS: No lymphadenopathy CARDIAC: RRR, no murmurs, no rubs, gallops RESPIRATORY:  Clear to auscultation without rales, wheezing or rhonchi  ABDOMEN: Soft, non-tender, non-distended MUSCULOSKELETAL:  No edema; No deformity  SKIN: Warm and dry NEUROLOGIC:  Alert and oriented x 3 PSYCHIATRIC:  Normal affect   ASSESSMENT:    1. Pre-procedure lab exam   2. Coronary artery disease of native artery of native heart with stable angina pectoris ([place])   3. Complete heart block ([place])   4. IPF (idiopathic pulmonary fibrosis) ([place])   5. Biventricular ICD (implantable cardioverter-defibrillator) in place   6. Chronic systolic heart failure ([place])   7. Precordial pain     PLAN:    In order of problems listed above:  CAD (coronary artery disease) Previously had heart catheterization October 15, 2011 with moderate coronary artery disease no PCI at that time.  Had a large anteroseptal MI and inferior MI previously.  ICD in place.  Given his current symptoms of chest discomfort upon stress, I think it makes sense for [place] to perform left heart catheterization with possible percutaneous intervention.  His coronary artery disease may have progressed.  Most recent ejection fraction is reassuringly normal.  We will give him isosorbide 30 mg as well for antianginal support in addition to his amlodipine that he takes as well as his carvedilol.  Complete heart block [facility]) ICD/CRT with recent replacement June 2022, Dr. Caryl Comes.  Working well.  IPF (idiopathic pulmonary fibrosis) ([place]) Pulmonary notes reviewed.  Biventricular ICD (implantable cardioverter-defibrillator) in place Dr. Caryl Comes monitoring.  Chronic systolic heart failure (HCC) EF thankfully has returned to normal.  Agree with current medical management with carvedilol, Jardiance was too expensive.  We discussed at length.  He had concerns about this medication.  Continue to encourage use.  Unable to utilize ACE inhibitor's because of prior pancreatitis.  He is not on spironolactone because of prior hyperkalemia.     Risks and benefits of cardiac catheterization have been discussed including stroke heart attack death renal impairment bleeding.  He is willing to proceed.   Medication Adjustments/Labs and Tests Ordered: Current medicines are reviewed at length with  the patient today.  Concerns regarding medicines are outlined above.  Orders Placed This Encounter  Procedures   CBC   Basic metabolic panel   EKG 12-INOM   Meds ordered this encounter  Medications   isosorbide mononitrate (IMDUR) 30 MG 24 hr tablet    Sig: Take 1 tablet (30 mg total) by mouth daily.    Dispense:  90 tablet    Refill:  3     Patient Instructions  Medication Instructions:  Please start Isosorbide 30 mg a day. Continue all other medications as listed.  *If you need a refill on your cardiac medications before your next appointment, please call your pharmacy*   Lab Work: Please have blood work today (CBC, BMP)  If you have labs (blood work) drawn today and your tests are completely normal, you will receive your results only by: MyChart Message (if you have [place]) OR A paper copy in the mail If you have any lab test that is abnormal or we need to change your treatment, we will call you to review the results.   Testing/Procedures:   [place] OFFICE [place], [place] [address] [address] Dept: [place]: [phone]  NORBERT MALKIN  07/11/2021  You are scheduled for a Cardiac Catheterization on Tuesday, March 14 with Dr. [name].  1. Please arrive at the [facility] (Main Entrance A) at [facility]: [address] [place], [place] [address] at 5:30 AM (two hours before your procedure to ensure your preparation). Free valet parking service is available.   Special note: Every effort is made to have your procedure done on time. Please understand that emergencies sometimes delay scheduled procedures.  2. Diet: Do not eat or drink anything after midnight prior to your procedure except sips of water to take medications.  3. Labs:  today  4. Medication instructions in preparation for your procedure:  Do not take your oral medications for DM this AM - hold for 48 hours after your cath. Take 1/2 normal dose of Insulin this AM  On the morning of your procedure, take your Aspirin and any morning medicines NOT listed above.  You may use sips of water.  5. Plan for one night stay--bring personal belongings. 6. Bring a current list of your medications and current insurance cards. 7. You MUST  have a responsible person to drive you home. 8. Someone MUST be with you the first 24 hours after you arrive home or your discharge will be delayed. 9. Please wear clothes that are easy to get on and off and wear slip-on shoes.  Thank you for allowing [place] to care for you!   -- [facility]  Follow-Up: At [facility], you and your health needs are our priority.  As part of our continuing mission to provide you with exceptional heart care, we have created designated Provider Care Teams.  These Care Teams include your primary Cardiologist (physician) and Advanced Practice Providers (APPs -  Physician Assistants and Nurse Practitioners) who all work together to provide you with the care you need, when you need it.  We recommend signing up for the patient portal called "MyChart".  Sign up information is provided on this After Visit Summary.  MyChart is used to connect with patients for Virtual Visits (Telemedicine).  Patients are able to view lab/test results, encounter notes, upcoming appointments, etc.  Non-urgent messages can be sent to your provider  as well.   To learn more about what you can do with MyChart, go to [email or web address].    Your next appointment:   2 month(s)  The format for your next appointment:   In Person  Provider:   Dr Marlou Porch or NP/PA.    Thank you for choosing [facility]!!      Signed, Candee Furbish, MD  07/11/2021 12:20 PM    [facility]

## 2021-07-11 NOTE — H&P (View-Only) (Signed)
?Cardiology Office Note:   ? ?Date:  07/11/2021  ? ?ID:  Luke [name], DOB 09-May-1945, MRN [id number] ? ?PCP:  Tonia Ghent, MD ?  ?[facility] Providers ?Cardiologist:  None    ? ?Referring MD: Tonia Ghent, MD  ? ? ?History of Present Illness:   ? ?Luke [name] is a 76 y.o. male here for the evaluation of chest pain that is been going off and on over the past 3 weeks lasting about 20 minutes after stress such as the treadmill.  He was recently diagnosed with idiopathic pulmonary fibrosis.  He was not sure whether the chest pain is related to this condition.  He wanted to have his heart checked out to make sure things were okay.  He messaged [place] about this condition. Comes and goes. Left arm.  ? ?I saw him last on 01/01/2021, has an ischemic cardiomyopathy left bundle branch block CRT ICD Dr. Caryl Comes after an intermittent complete heart block.  He goes by [name].  He is a former Dr. [name] patient. ? ?No mode switches, normal device function. ? ?On CT scan of lungs has had three-vessel coronary atherosclerosis. ? ?He is a [place] vet. ? ?[facility] 2013 - 2V CAD Left anterior descending (LAD): focal 70-80% proximal. Long 40% mid vessel. Right coronary artery (RCA): Diffusely diseased. 50-60% mid vessel, 70% distal, 70% at bifurcation of PDA/PLOM. ?   ?LV Ejection Fraction: 30%. He underwent catheterization which confirmed severe LV dysfunction with moderate to severe 2 vessel disease not thought to be appropriate for intervention.   ? ?Past Medical History:  ?Diagnosis Date  ? AAA (abdominal aortic aneurysm) 2011  ? Per vascular surgery  ? Anxiety   ? no med. in use for anxiety but pt. speaks openly of his stress & anxious feelings regarding impending surgery    ? Atrial fibrillation (Luke [name])   ? Automatic implantable cardioverter-defibrillator in situ   ? CAD (coronary artery disease)   ? Presumed CAD with nuclear scan October 09, 2011,  large anteroseptal MI and inferior MI. Catheterization scheduled October 15, 2011  ?  Cardiomyopathy ([place])   ? Nuclear, October 09, 2011, EF 30%, multiple focal wall motion abnormalities  ? Diabetes mellitus   ? type II  ? Drug therapy   ? Hyperkalemia with spironolactone  ? Ejection fraction < 50%   ? EF 30%, nuclear, October 09, 2011  ? Ejection fraction < 50%   ? EF previously 30%  //   EF 30-35%, echo, July 14, 2012, severe diffuse hypokinesis, PA pressure 43 mm mercury  ? Fall due to ice or snow Feb.  19, 2015  ? HLD (hyperlipidemia)   ? Hypertension   ? white coat HTN-- often elevated in office and controlled on outside checks.  ? ICD (implantable cardioverter-defibrillator) in place   ? CRT-D placed March, 2014 complete heart block and k dysfunction  ? LBBB (left bundle branch block)   ? LBBB on EKG October 11, 2011,  no prior EKG has been done  ? Low testosterone   ? Hx of  ? Myocardial infarction [facility])   ? Told in 2015-never knew-showed up on stress test for AAA;   ? Pacemaker   ? PAD (peripheral artery disease) ([place])   ? Pancreatitis   ? ? ?Past Surgical History:  ?Procedure Laterality Date  ? ABDOMINAL AORTAGRAM N/A 10/28/2011  ? Procedure: ABDOMINAL AORTAGRAM;  Surgeon: Angelia Mould, MD;  Location: [facility] CATH LAB;  Service: Cardiovascular;  Laterality:  N/A;  ? ABDOMINAL AORTIC ANEURYSM REPAIR    ? EVAR   ? BI-VENTRICULAR IMPLANTABLE CARDIOVERTER DEFIBRILLATOR N/A 07/16/2012  ? Procedure: BI-VENTRICULAR IMPLANTABLE CARDIOVERTER DEFIBRILLATOR  (CRT-D);  Surgeon: Deboraha Sprang, MD;  Location: [facility] CATH LAB;  Service: Cardiovascular;  Laterality: N/A;  ? BIV ICD GENERATOR CHANGEOUT N/A 10/25/2020  ? Procedure: BIV ICD GENERATOR CHANGEOUT;  Surgeon: Deboraha Sprang, MD;  Location: [facility] CV LAB;  Service: Cardiovascular;  Laterality: N/A;  ? CARDIAC CATHETERIZATION    ? COLONOSCOPY  03/23/2018; 2020  ? ESOPHAGOGASTRODUODENOSCOPY N/A 02/12/2019  ? Procedure: ESOPHAGOGASTRODUODENOSCOPY (EGD);  Surgeon: Jackquline Denmark, MD;  Location: [facility] ENDOSCOPY;  Service: Endoscopy;  Laterality: N/A;  ?  ESOPHAGOGASTRODUODENOSCOPY (EGD) WITH PROPOFOL N/A 02/05/2018  ? Procedure: ESOPHAGOGASTRODUODENOSCOPY (EGD) WITH PROPOFOL;  Surgeon: Mauri Pole, MD;  Location: [facility] ENDOSCOPY;  Service: Endoscopy;  Laterality: N/A;  ? ESOPHAGOGASTRODUODENOSCOPY (EGD) WITH PROPOFOL N/A 09/27/2018  ? Procedure: ESOPHAGOGASTRODUODENOSCOPY (EGD) WITH PROPOFOL;  Surgeon: Carol Ada, MD;  Location: [facility];  Service: Endoscopy;  Laterality: N/A;  ? FOREIGN BODY REMOVAL  09/27/2018  ? Procedure: FOREIGN BODY REMOVAL;  Surgeon: Carol Ada, MD;  Location: [place];  Service: Endoscopy;;  ? FOREIGN BODY REMOVAL  02/12/2019  ? Procedure: FOREIGN BODY REMOVAL;  Surgeon: Jackquline Denmark, MD;  Location[facility] ENDOSCOPY;  Service: Endoscopy;;  ? HERNIA REPAIR  Jan. 9, 2015  ? INSERTION OF MESH N/A 05/14/2013  ? Procedure: INSERTION OF MESH;  Surgeon: Adin Hector, MD;  Location: [place];  Service: General;  Laterality: N/A;  ? LOWER EXTREMITY ANGIOGRAM Bilateral 10/28/2011  ? Procedure: LOWER EXTREMITY ANGIOGRAM;  Surgeon: Angelia Mould, MD;  Location: [facility] CATH LAB;  Service: Cardiovascular;  Laterality: Bilateral;  ? PACEMAKER INSERTION  07-16-12  ? pacemaker/defibrilator  ? POLYPECTOMY    ? POSTERIOR CERVICAL FUSION/FORAMINOTOMY N/A 06/09/2014  ? Procedure: Laminectomy - Cervical two-Cervcial four posterior cervical instrumented fusion Cervical two-cervical four;  Surgeon: Eustace Moore, MD;  Location: [place] NEURO ORS;  Service: Neurosurgery;  Laterality: N/A;  posterior ?  ? SAVORY DILATION N/A 02/05/2018  ? Procedure: SAVORY DILATION;  Surgeon: Mauri Pole, MD;  Location: [facility] ENDOSCOPY;  Service: Endoscopy;  Laterality: N/A;  ? TONSILLECTOMY    ? as a child   ? UMBILICAL HERNIA REPAIR N/A 05/14/2013  ? Procedure: LAPAROSCOPIC exploration and repair of hernia in abdominal ;  Surgeon: Adin Hector, MD;  Location: [place];  Service: General;  Laterality: N/A;  ? UPPER GASTROINTESTINAL ENDOSCOPY  2020  ? ? ?Current  Medications: ?Current Meds  ?Medication Sig  ? albuterol (VENTOLIN HFA) 108 (90 Base) MCG/ACT inhaler Inhale 2 puffs into the lungs every 6 (six) hours as needed for wheezing or shortness of breath.  ? amLODipine (NORVASC) 10 MG tablet TAKE 1 TABLET(10 MG) BY MOUTH DAILY  ? aspirin EC 81 MG tablet Take 81 mg by mouth daily.  ? carvedilol (COREG) 25 MG tablet TAKE 1 TABLET BY MOUTH TWICE DAILY WITH MEALS  ? Cholecalciferol (VITAMIN D PO) Take 2,500 Units by mouth daily.  ? Cyanocobalamin 2500 MCG CHEW Chew by mouth daily. Per patient taking 1,000 mcg daily  ? glipiZIDE (GLUCOTROL) 5 MG tablet TAKE 1 TABLET(5 MG) BY MOUTH TWICE DAILY BEFORE A MEAL  ? glucose blood (ACCU-CHEK AVIVA PLUS) test strip USE TO CHECK BLOOD GLUCOSE UP TO TWICE DAILY.  ? hydrALAZINE (APRESOLINE) 25 MG tablet TAKE 1 TABLET(25 MG) BY MOUTH THREE TIMES DAILY  ? insulin glargine (LANTUS SOLOSTAR)  100 UNIT/ML Solostar Pen INJECT 0.3 TO 0.35 MLS(30 TO 35 UNITS) INTO THE SKIN EVERY DAY  ? isosorbide mononitrate (IMDUR) 30 MG 24 hr tablet Take 1 tablet (30 mg total) by mouth daily.  ? metFORMIN (GLUCOPHAGE) 500 MG tablet TAKE 2 TABLETS BY MOUTH TWICE DAILY WITH FOOD  ? Multiple Vitamin (MULTIVITAMIN) tablet Take 1 tablet by mouth daily.  ? omeprazole (PRILOSEC) 40 MG capsule TAKE 1 CAPSULE(40 MG) BY MOUTH TWICE DAILY (Patient taking differently: daily. Per patient taking 1 tablet daily)  ? Pirfenidone (ESBRIET) 267 MG TABS 2 tabs three times daily.  ? sildenafil (REVATIO) 20 MG tablet TAKE 3 TO 4 TABLETS BY MOUTH DAILY AS NEEDED  ? simvastatin (ZOCOR) 40 MG tablet Take 1 tablet (40 mg total) by mouth at bedtime. Has tolerated even with amlodipine use.  He didn't tolerate atorvastatin.  ? umeclidinium bromide (INCRUSE ELLIPTA) 62.5 MCG/INH AEPB Inhale 1 puff into the lungs daily.  ?  ? ?Allergies:   Aldactone [spironolactone], Codeine, Atorvastatin, Januvia [sitagliptin], Jardiance [empagliflozin], and Lisinopril  ? ?Social History  ? ?Socioeconomic  History  ? Marital status: Married  ?  Spouse name: Precious Bard  ? Number of children: 1  ? Years of education: Not on file  ? Highest education level: Not on file  ?Occupational History  ? Occupation: retired  ?Tobacco Use  ? Smoking st

## 2021-07-11 NOTE — Assessment & Plan Note (Addendum)
EF thankfully has returned to normal.  Agree with current medical management with carvedilol, Jardiance was too expensive.  We discussed at length.  He had concerns about this medication.  Continue to encourage use.  Unable to utilize ACE inhibitor's because of prior pancreatitis.  He is not on spironolactone because of prior hyperkalemia. ?

## 2021-07-11 NOTE — Patient Instructions (Signed)
Medication Instructions:  ?Please start Isosorbide 30 mg a day. ?Continue all other medications as listed. ? ?*If you need a refill on your cardiac medications before your next appointment, please call your pharmacy* ? ? ?Lab Work: ?Please have blood work today (CBC, BMP) ? ?If you have labs (blood work) drawn today and your tests are completely normal, you will receive your results only by: ?MyChart Message (if you have MyChart) OR ?A paper copy in the mail ?If you have any lab test that is abnormal or we need to change your treatment, we will call you to review the results. ? ? ?Testing/Procedures: ? ? ?[place] [facility] OFFICE ?[address], [address] ?[place] [address] ?Dept: [phone] ?Loc: [phone] ? ?Luke [name]  07/11/2021 ? ?You are scheduled for a Cardiac Catheterization on Tuesday, March 14 with Dr. [name]. ? ?1. Please arrive at the [facility] (Main Entrance A) at [facility]: [address] [place], [place] [address] at 5:30 AM (two hours before your procedure to ensure your preparation). Free valet parking service is available.  ? ?Special note: Every effort is made to have your procedure done on time. Please understand that emergencies sometimes delay scheduled procedures. ? ?2. Diet: Do not eat or drink anything after midnight prior to your procedure except sips of water to take medications. ? ?3. Labs:  today ? ?4. Medication instructions in preparation for your procedure: ? ?Do not take your oral medications for DM this AM - hold for 48 hours after your cath. ?Take 1/2 normal dose of Insulin this AM ? ?On the morning of your procedure, take your Aspirin and any morning medicines NOT listed above.  You may use sips of water. ? ?5. Plan for one night stay--bring personal belongings. ?6. Bring a current list of your medications and current insurance cards. ?7. You MUST have a responsible person to  drive you home. ?8. Someone MUST be with you the first 24 hours after you arrive home or your discharge will be delayed. ?9. Please wear clothes that are easy to get on and off and wear slip-on shoes. ? ?Thank you for allowing [place] to care for you! ?  -- [name] ? ?Follow-Up: ?At [facility], you and your health needs are our priority.  As part of our continuing mission to provide you with exceptional heart care, we have created designated Provider Care Teams.  These Care Teams include your primary Cardiologist (physician) and Advanced Practice Providers (APPs -  Physician Assistants and Nurse Practitioners) who all work together to provide you with the care you need, when you need it. ? ?We recommend signing up for the patient portal called "MyChart".  Sign up information is provided on this After Visit Summary.  MyChart is used to connect with patients for Virtual Visits (Telemedicine).  Patients are able to view lab/test results, encounter notes, upcoming appointments, etc.  Non-urgent messages can be sent to your provider as well.   ?To learn more about what you can do with MyChart, go to [email or web address].   ? ?Your next appointment:   ?2 month(s) ? ?The format for your next appointment:   ?In Person ? ?Provider:   ?Dr Marlou Porch or NP/PA. ? ? ? ?Thank you for choosing [facility]!! ? ? ? ?

## 2021-07-12 ENCOUNTER — Other Ambulatory Visit: Payer: Self-pay | Admitting: Cardiology

## 2021-07-12 DIAGNOSIS — I2510 Atherosclerotic heart disease of native coronary artery without angina pectoris: Secondary | ICD-10-CM

## 2021-07-16 ENCOUNTER — Telehealth: Payer: Self-pay | Admitting: *Deleted

## 2021-07-16 NOTE — Telephone Encounter (Addendum)
Cardiac Catheterization scheduled at [facility] for: Tuesday July 17, 2021 7:30 AM ?Arrival time and place: [place] Entrance A  at: 5:30 AM ? ? ?No solid food after midnight prior to cath, clear liquids until 5 AM day of procedure. ? ?Medication instructions: ?-Hold: ?Metformin-day of procedure and 48 hours post procedure ?Glipizide-AM of procedure ?Insulin-1/2 usual dose HS prior to procedure ?  ?-Except hold medications usual morning medications can be taken with sips of water including aspirin 81 mg. ? ?Confirmed patient has responsible adult to drive home post procedure and be with patient first 24 hours after arriving home: ? ?One visitor is allowed to stay in the waiting room during the time you are at the hospital for your procedure.  ? ?Reviewed procedure instructions with patient. ?

## 2021-07-17 ENCOUNTER — Ambulatory Visit (HOSPITAL_COMMUNITY)
Admission: RE | Admit: 2021-07-17 | Discharge: 2021-07-17 | Disposition: A | Discharge status: Home / Self Care | Payer: Medicare Other | Source: Ambulatory Visit | Attending: Cardiovascular Disease | Admitting: Cardiovascular Disease

## 2021-07-17 ENCOUNTER — Encounter (HOSPITAL_COMMUNITY)
Admission: RE | Disposition: A | Discharge status: Home / Self Care | Payer: Self-pay | Source: Ambulatory Visit | Attending: Cardiovascular Disease

## 2021-07-17 ENCOUNTER — Other Ambulatory Visit: Payer: Self-pay

## 2021-07-17 ENCOUNTER — Ambulatory Visit (HOSPITAL_BASED_OUTPATIENT_CLINIC_OR_DEPARTMENT_OTHER): Payer: Medicare Other

## 2021-07-17 ENCOUNTER — Other Ambulatory Visit (HOSPITAL_COMMUNITY): Payer: Medicare Other

## 2021-07-17 DIAGNOSIS — I5022 Chronic systolic (congestive) heart failure: Secondary | ICD-10-CM | POA: Diagnosis not present

## 2021-07-17 DIAGNOSIS — I255 Ischemic cardiomyopathy: Secondary | ICD-10-CM | POA: Insufficient documentation

## 2021-07-17 DIAGNOSIS — Z95810 Presence of automatic (implantable) cardiac defibrillator: Secondary | ICD-10-CM | POA: Diagnosis not present

## 2021-07-17 DIAGNOSIS — I2089 Other forms of angina pectoris: Secondary | ICD-10-CM

## 2021-07-17 DIAGNOSIS — Z87891 Personal history of nicotine dependence: Secondary | ICD-10-CM | POA: Diagnosis not present

## 2021-07-17 DIAGNOSIS — I442 Atrioventricular block, complete: Secondary | ICD-10-CM | POA: Insufficient documentation

## 2021-07-17 DIAGNOSIS — J84112 Idiopathic pulmonary fibrosis: Secondary | ICD-10-CM | POA: Insufficient documentation

## 2021-07-17 DIAGNOSIS — Z7984 Long term (current) use of oral hypoglycemic drugs: Secondary | ICD-10-CM | POA: Diagnosis not present

## 2021-07-17 DIAGNOSIS — Z794 Long term (current) use of insulin: Secondary | ICD-10-CM | POA: Insufficient documentation

## 2021-07-17 DIAGNOSIS — I110 Hypertensive heart disease with heart failure: Secondary | ICD-10-CM | POA: Insufficient documentation

## 2021-07-17 DIAGNOSIS — I2582 Chronic total occlusion of coronary artery: Secondary | ICD-10-CM | POA: Insufficient documentation

## 2021-07-17 DIAGNOSIS — I2510 Atherosclerotic heart disease of native coronary artery without angina pectoris: Secondary | ICD-10-CM

## 2021-07-17 DIAGNOSIS — Z7982 Long term (current) use of aspirin: Secondary | ICD-10-CM | POA: Insufficient documentation

## 2021-07-17 DIAGNOSIS — I447 Left bundle-branch block, unspecified: Secondary | ICD-10-CM | POA: Diagnosis not present

## 2021-07-17 DIAGNOSIS — I25118 Atherosclerotic heart disease of native coronary artery with other forms of angina pectoris: Secondary | ICD-10-CM | POA: Insufficient documentation

## 2021-07-17 DIAGNOSIS — I4891 Unspecified atrial fibrillation: Secondary | ICD-10-CM | POA: Diagnosis not present

## 2021-07-17 DIAGNOSIS — I252 Old myocardial infarction: Secondary | ICD-10-CM | POA: Diagnosis not present

## 2021-07-17 DIAGNOSIS — R079 Chest pain, unspecified: Secondary | ICD-10-CM | POA: Diagnosis present

## 2021-07-17 DIAGNOSIS — E785 Hyperlipidemia, unspecified: Secondary | ICD-10-CM | POA: Diagnosis not present

## 2021-07-17 DIAGNOSIS — E1151 Type 2 diabetes mellitus with diabetic peripheral angiopathy without gangrene: Secondary | ICD-10-CM | POA: Insufficient documentation

## 2021-07-17 DIAGNOSIS — Z79899 Other long term (current) drug therapy: Secondary | ICD-10-CM | POA: Insufficient documentation

## 2021-07-17 DIAGNOSIS — I208 Other forms of angina pectoris: Secondary | ICD-10-CM

## 2021-07-17 LAB — GLUCOSE, CAPILLARY
Glucose-Capillary: 172 mg/dL — ABNORMAL HIGH (ref 70–99)
Glucose-Capillary: 150 mg/dL — ABNORMAL HIGH (ref 70–99)

## 2021-07-17 LAB — ECHOCARDIOGRAM COMPLETE
AR max vel: 1.84 cm2
AV Area VTI: 1.77 cm2
AV Area mean vel: 1.77 cm2
AV Mean grad: 4.5 mmHg
AV Peak grad: 8.2 mmHg
Ao pk vel: 1.43 m/s
Area-P 1/2: 2.84 cm2
Height: 74 in
S' Lateral: 3.1 cm
Weight: 2960 oz

## 2021-07-17 SURGERY — LEFT HEART CATH AND CORONARY ANGIOGRAPHY
Anesthesia: LOCAL

## 2021-07-17 MED ORDER — SODIUM CHLORIDE 0.9% FLUSH: 3.0000 mL | INTRAVENOUS | Status: DC | PRN

## 2021-07-17 MED ORDER — ASPIRIN 81 MG PO CHEW: 81.0000 mg | CHEWABLE_TABLET | ORAL | Status: AC

## 2021-07-17 MED ORDER — VERAPAMIL HCL 2.5 MG/ML IV SOLN
INTRAVENOUS | Status: DC | PRN
  Administered 2021-07-17: 10 mL via INTRA_ARTERIAL

## 2021-07-17 MED ORDER — ISOSORBIDE MONONITRATE ER 60 MG PO TB24
60.0000 mg | ORAL_TABLET | Freq: Every day | ORAL | Status: DC
  Administered 2021-07-17: 60 mg via ORAL
  Filled 2021-07-17: qty 1

## 2021-07-17 MED ORDER — SODIUM CHLORIDE 0.9% FLUSH: 3.0000 mL | Freq: Two times a day (BID) | INTRAVENOUS | Status: DC

## 2021-07-17 MED ORDER — FENTANYL CITRATE (PF) 100 MCG/2ML IJ SOLN
INTRAMUSCULAR | Status: AC
  Filled 2021-07-17: qty 2

## 2021-07-17 MED ORDER — ACETAMINOPHEN 325 MG PO TABS: 650.0000 mg | ORAL_TABLET | ORAL | Status: DC | PRN

## 2021-07-17 MED ORDER — MIDAZOLAM HCL 2 MG/2ML IJ SOLN
INTRAMUSCULAR | Status: DC | PRN
  Administered 2021-07-17 (×2): 1 mg via INTRAVENOUS

## 2021-07-17 MED ORDER — VERAPAMIL HCL 2.5 MG/ML IV SOLN
INTRAVENOUS | Status: AC
  Filled 2021-07-17: qty 2

## 2021-07-17 MED ORDER — LIDOCAINE HCL (PF) 1 % IJ SOLN
INTRAMUSCULAR | Status: AC
  Filled 2021-07-17: qty 30

## 2021-07-17 MED ORDER — IOHEXOL 350 MG/ML SOLN
INTRAVENOUS | Status: DC | PRN
  Administered 2021-07-17: 80 mL

## 2021-07-17 MED ORDER — SODIUM CHLORIDE 0.9 % IV SOLN: 250.0000 mL | INTRAVENOUS | Status: DC | PRN

## 2021-07-17 MED ORDER — LABETALOL HCL 5 MG/ML IV SOLN: 10.0000 mg | INTRAVENOUS | Status: DC | PRN

## 2021-07-17 MED ORDER — MIDAZOLAM HCL 2 MG/2ML IJ SOLN
INTRAMUSCULAR | Status: AC
  Filled 2021-07-17: qty 2

## 2021-07-17 MED ORDER — SODIUM CHLORIDE 0.9 % WEIGHT BASED INFUSION
3.0000 mL/kg/h | INTRAVENOUS | Status: AC
  Administered 2021-07-17: 3 mL/kg/h via INTRAVENOUS

## 2021-07-17 MED ORDER — HEPARIN SODIUM (PORCINE) 1000 UNIT/ML IJ SOLN
INTRAMUSCULAR | Status: DC | PRN
  Administered 2021-07-17: 4200 [IU] via INTRAVENOUS

## 2021-07-17 MED ORDER — SODIUM CHLORIDE 0.9 % IV SOLN: INTRAVENOUS | Status: DC

## 2021-07-17 MED ORDER — HEPARIN SODIUM (PORCINE) 1000 UNIT/ML IJ SOLN
INTRAMUSCULAR | Status: AC
  Filled 2021-07-17: qty 10

## 2021-07-17 MED ORDER — LIDOCAINE HCL (PF) 1 % IJ SOLN
INTRAMUSCULAR | Status: DC | PRN
  Administered 2021-07-17: 2 mL

## 2021-07-17 MED ORDER — HYDRALAZINE HCL 20 MG/ML IJ SOLN: 10.0000 mg | INTRAMUSCULAR | Status: DC | PRN

## 2021-07-17 MED ORDER — ASPIRIN 81 MG PO CHEW: 81.0000 mg | CHEWABLE_TABLET | Freq: Every day | ORAL | Status: DC

## 2021-07-17 MED ORDER — ONDANSETRON HCL 4 MG/2ML IJ SOLN: 4.0000 mg | Freq: Four times a day (QID) | INTRAMUSCULAR | Status: DC | PRN

## 2021-07-17 MED ORDER — HEPARIN (PORCINE) IN NACL 1000-0.9 UT/500ML-% IV SOLN
INTRAVENOUS | Status: DC | PRN
  Administered 2021-07-17 (×2): 500 mL

## 2021-07-17 MED ORDER — NITROGLYCERIN 1 MG/10 ML FOR IR/CATH LAB
INTRA_ARTERIAL | Status: AC
  Filled 2021-07-17: qty 10

## 2021-07-17 MED ORDER — FENTANYL CITRATE (PF) 100 MCG/2ML IJ SOLN
INTRAMUSCULAR | Status: DC | PRN
  Administered 2021-07-17 (×2): 25 ug via INTRAVENOUS

## 2021-07-17 MED ORDER — SODIUM CHLORIDE 0.9 % WEIGHT BASED INFUSION: 1.0000 mL/kg/h | INTRAVENOUS | Status: DC

## 2021-07-17 MED ORDER — HEPARIN (PORCINE) IN NACL 1000-0.9 UT/500ML-% IV SOLN
INTRAVENOUS | Status: AC
  Filled 2021-07-17: qty 1000

## 2021-07-17 SURGICAL SUPPLY — 12 items
BAND CMPR LRG ZPHR (HEMOSTASIS) ×1
BAND ZEPHYR COMPRESS 30 LONG (HEMOSTASIS) ×1 IMPLANT
CATH INFINITI JR4 5F (CATHETERS) ×1 IMPLANT
CATH OPTITORQUE TIG 4.0 5F (CATHETERS) ×1 IMPLANT
GLIDESHEATH SLEND SS 6F .021 (SHEATH) ×1 IMPLANT
GUIDEWIRE INQWIRE 1.5J.035X260 (WIRE) IMPLANT
INQWIRE 1.5J .035X260CM (WIRE) ×2
KIT HEART LEFT (KITS) ×2 IMPLANT
PACK CARDIAC CATHETERIZATION (CUSTOM PROCEDURE TRAY) ×2 IMPLANT
SHEATH PROBE COVER 6X72 (BAG) ×1 IMPLANT
TRANSDUCER W/STOPCOCK (MISCELLANEOUS) ×2 IMPLANT
TUBING CIL FLEX 10 FLL-RA (TUBING) ×2 IMPLANT

## 2021-07-17 NOTE — Progress Notes (Signed)
Echocardiogram ?2D Echocardiogram has been performed. ? ?Luke [name] ?07/17/2021, 12:26 PM ?

## 2021-07-17 NOTE — Interval H&P Note (Signed)
Cath Lab Visit (complete for each Cath Lab visit) ? ?Clinical Evaluation Leading to the Procedure:  ? ?ACS: No. ? ?Non-ACS:   ? ?Anginal Classification: CCS II ? ?Anti-ischemic medical therapy: Maximal Therapy (2 or more classes of medications) ? ?Non-Invasive Test Results: No non-invasive testing performed ? ?Prior CABG: No previous CABG ? ? ? ? ? ?History and Physical Interval Note: ? ?07/17/2021 ?7:45 AM ? ?Luke [name]  has presented today for surgery, with the diagnosis of chest pain.  The various methods of treatment have been discussed with the patient and family. After consideration of risks, benefits and other options for treatment, the patient has consented to  Procedure(s): ?LEFT HEART CATH AND CORONARY ANGIOGRAPHY (N/A) as a surgical intervention.  The patient's history has been reviewed, patient examined, no change in status, stable for surgery.  I have reviewed the patient's chart and labs.  Questions were answered to the patient's satisfaction.   ? ? ?Luke [name] ? ? ?

## 2021-07-17 NOTE — Progress Notes (Signed)
Dr. Claiborne Billings talking w/patient at bedside ?

## 2021-07-18 ENCOUNTER — Encounter (HOSPITAL_COMMUNITY): Payer: Self-pay | Admitting: Cardiovascular Disease

## 2021-07-18 ENCOUNTER — Encounter: Payer: Self-pay | Admitting: Cardiology

## 2021-07-25 ENCOUNTER — Ambulatory Visit (INDEPENDENT_AMBULATORY_CARE_PROVIDER_SITE_OTHER): Payer: Medicare Other

## 2021-07-25 DIAGNOSIS — I442 Atrioventricular block, complete: Secondary | ICD-10-CM | POA: Diagnosis not present

## 2021-07-25 LAB — CUP PACEART REMOTE DEVICE CHECK
Battery Remaining Longevity: 85 mo
Battery Remaining Percentage: 90 %
Battery Voltage: 2.99 V
Brady Statistic AP VP Percent: 16 %
Brady Statistic AP VS Percent: 1.2 %
Brady Statistic AS VP Percent: 75 %
Brady Statistic AS VS Percent: 4.1 %
Brady Statistic RA Percent Paced: 13 %
Date Time Interrogation Session: 20230322031719
HighPow Impedance: 79 Ohm
Implantable Lead Implant Date: 20140313
Implantable Lead Implant Date: 20140313
Implantable Lead Implant Date: 20140313
Implantable Lead Location: 753858
Implantable Lead Location: 753859
Implantable Lead Location: 753860
Implantable Pulse Generator Implant Date: 20220622
Lead Channel Impedance Value: 390 Ohm
Lead Channel Impedance Value: 430 Ohm
Lead Channel Impedance Value: 800 Ohm
Lead Channel Pacing Threshold Amplitude: 0.625 V
Lead Channel Pacing Threshold Amplitude: 0.75 V
Lead Channel Pacing Threshold Amplitude: 1.25 V
Lead Channel Pacing Threshold Pulse Width: 0.5 ms
Lead Channel Pacing Threshold Pulse Width: 0.5 ms
Lead Channel Pacing Threshold Pulse Width: 0.6 ms
Lead Channel Sensing Intrinsic Amplitude: 11.8 mV
Lead Channel Sensing Intrinsic Amplitude: 3.2 mV
Lead Channel Setting Pacing Amplitude: 1.625
Lead Channel Setting Pacing Amplitude: 1.75 V
Lead Channel Setting Pacing Amplitude: 2 V
Lead Channel Setting Pacing Pulse Width: 0.5 ms
Lead Channel Setting Pacing Pulse Width: 0.6 ms
Lead Channel Setting Sensing Sensitivity: 0.5 mV
Pulse Gen Serial Number: 810030647

## 2021-08-01 ENCOUNTER — Other Ambulatory Visit (INDEPENDENT_AMBULATORY_CARE_PROVIDER_SITE_OTHER): Payer: Medicare Other

## 2021-08-01 DIAGNOSIS — J84112 Idiopathic pulmonary fibrosis: Secondary | ICD-10-CM

## 2021-08-01 DIAGNOSIS — Z5181 Encounter for therapeutic drug level monitoring: Secondary | ICD-10-CM | POA: Diagnosis not present

## 2021-08-01 LAB — HEPATIC FUNCTION PANEL
ALT: 16 U/L (ref 0–53)
AST: 15 U/L (ref 0–37)
Albumin: 4.1 g/dL (ref 3.5–5.2)
Alkaline Phosphatase: 55 U/L (ref 39–117)
Bilirubin, Direct: 0.1 mg/dL (ref 0.0–0.3)
Total Bilirubin: 0.4 mg/dL (ref 0.2–1.2)
Total Protein: 6.8 g/dL (ref 6.0–8.3)

## 2021-08-02 ENCOUNTER — Ambulatory Visit: Payer: Medicare Other | Admitting: Internal Medicine

## 2021-08-02 DIAGNOSIS — Z23 Encounter for immunization: Secondary | ICD-10-CM | POA: Diagnosis not present

## 2021-08-07 NOTE — Progress Notes (Signed)
Remote ICD transmission.   

## 2021-08-08 ENCOUNTER — Ambulatory Visit (INDEPENDENT_AMBULATORY_CARE_PROVIDER_SITE_OTHER): Payer: Medicare Other | Admitting: Internal Medicine

## 2021-08-08 ENCOUNTER — Encounter: Payer: Self-pay | Admitting: Internal Medicine

## 2021-08-08 VITALS — BP 138/82 | HR 71 | Temp 97.9°F | Ht 74.0 in | Wt 188.7 lb

## 2021-08-08 DIAGNOSIS — J432 Centrilobular emphysema: Secondary | ICD-10-CM

## 2021-08-08 DIAGNOSIS — J84112 Idiopathic pulmonary fibrosis: Secondary | ICD-10-CM

## 2021-08-08 DIAGNOSIS — Z5181 Encounter for therapeutic drug level monitoring: Secondary | ICD-10-CM | POA: Diagnosis not present

## 2021-08-08 DIAGNOSIS — I2510 Atherosclerotic heart disease of native coronary artery without angina pectoris: Secondary | ICD-10-CM

## 2021-08-08 NOTE — Patient Instructions (Signed)
ICD-10-CM   ?1. IPF (idiopathic pulmonary fibrosis) (HCC)  J84.112 Hepatic function panel  ?  Hepatic function panel  ?  ?2. Centrilobular emphysema ([place])  J43.2   ?  ?3. 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[place])  J43.9   ? J84.10   ?  ? ?You have emphysema - ? -continue spiriva respimat 2 puff daily  ?- continue  albuterol prn ? ? ?Re IPF ? ?- subjectively stable ? - esbriet on hold since mid may 2023 due to dizziness made by worse by nitrate and improved after stopping esbriet ?- LFT normal March 2023 ? ?Plan ?- once cardiology feels you are stable on imdure - after 09/11/21 you will restart esbriet fresh per protocol ? -  1 pill three times daily with food, 6h apart and wear sunscreen and 1 week later at 2 pill thress times daily ? - no ofev as discussed for the moment ?- clinical trial consideration as discused later in summer 2023 ? ? ?Chronic Dry Skin ? ?Glad better with flaxseed ? ?Plan ? - flax seed cotninue ? ?Coronary atherosclerosiss ? ?Plan ? - try vegan/vegeterian diets for reversal ? - look up Francis Gaines and [employer] - take sheet ? ? ?Followup -  ? ?- mid to end May 2023 on 30 min vsti wth Jeanni Allshouse  but after PFT ? - symptom score and walk test at followup ? ? ?

## 2021-08-08 NOTE — Progress Notes (Signed)
? ? ? ? ?OV 01/02/2021 ? ?Subjective:  ?Patient ID: Luke [name], male , DOB: April 30, 1946 , age 76 y.o. , MRN: [id number] , ADDRESS: [address] ?[place] [phone] ?PCP Tonia Ghent, MD ?Patient Care Team: ?Tonia Ghent, MD as PCP - General ?Angelia Mould, MD (Vascular Surgery) ?Arta Silence, MD (Gastroenterology) ?Michael Boston, MD (General Surgery) ?Eustace Moore, MD (Neurosurgery) ?Juluis Rainier (Optometry) ?Jerline Pain, MD (Cardiology) ?[name], [facility] as Pharmacist (Pharmacist) ? ?This Provider for this visit: Treatment Team:  ?Attending Provider: Brand Males, MD ? ? ? ?01/02/2021 -transfer of care to the interstitial lung disease center for Dr. Chase Caller.  Referral from Dr. Leory Plowman Icard ?Chief Complaint  ?Patient presents with  ? Consult  ?  ILD consult per Dr. Valeta Harms. Pt states he has been doing okay since last visit and denies any complaints.  ? ? ? ?HPI ?Luke [name] 76 y.o. -referred by Dr. June Leap for interstitial lung disease evaluation ? ? ?[place] Integrated Comprehensive ILD Questionnaire ? ?Symptoms:  -Reports insidious onset of shortness of breath gradually.  It is the same since it started.  He is unclear how long he is headed but he had it for a few years.  There is no cough at all.  There is no clearing of the throat.  There is no fatigue.  His appetite is good no nausea no vomiting no diarrhea no anxiety no depression no chronic pain ? ? ? ?Past Medical History :   In 2009 he was diagnosed with systolic heart failure.  He believes etiology is ischemic based on a remote heart attack.  Has a history of diabetes for several years.  Has kidney disease not otherwise specified for several years.  His GFR in May 2022 was greater than 60 with a creatinine 1.23 mg percent. ? ? His last echocardiogram was in 2019.  He sees cardiology Dr. Caryl Comes and Dr. Candee Furbish.  Overall he is deemed to be stable.  He had pacemaker check recently.  Denies any  collagen vascular disease or vasculitis sleep apnea.  Denies any PE. ? ?He has had COVID-vaccine but has not had COVID. ?ROS:   ?-Positive for fatigue.  Has some dysphagia for the last few days but otherwise okay.  No nausea no vomiting no heartburn no snoring no rash no ulcers. ? ? ?FAMILY HISTORY of LUNG DISEASE: Denies ? ? ?EXPOSURE HISTORY: Smokes cigarettes 20 1966 in 2012 30 cigarettes/day.  He did have some passive smoking.  Did smoke marijuana between 1968 and 1970.  Once a month.  No cocaine use no intravenous drug use ? ? ?HOME and HOBBY DETAILS : Single-family [place] the last 25 years the age of the home is 41 years.  There is no dampness.  No mildew no mold.  No humidifier use no CPAP use no nebulizer use.  No steam iron use no Jacuzzi use.  No misting [place] no pet birds or parakeets no pet gerbils.  No mold in the [facility] duct.  Last checked in 2009.  Does do gardening.  No birds at this no strong mats no water damage no Jacuzzi. ? ? ?OCCUPATIONAL HISTORY (122 questions) : He was in [place] and got exposed to agent orange but otherwise detailed questioning is negative other than the fact he worked with some petroleum based cleaning agents. ? ? ?PULMONARY TOXICITY HISTORY (27 items): He was on hydralazine in 2019. ? ? ?HRCT May 2022 -  personally visualzied -definite progression is combined emphysema with UIP features. ? ?IMPRESSION: ?1. Spectrum of findings compatible with basilar predominant fibrotic ?interstitial lung disease without frank honeycombing, with mild ?progression at the lung bases since 03/25/2018 CT abdomen study, ?with more clear progression since 10/07/2011 CT abdomen study. ?Findings are categorized as probable UIP per consensus guidelines: ?Diagnosis of Idiopathic Pulmonary Fibrosis: An Official ?ATS/ERS/JRS/ALAT Clinical Practice Guideline. [place] ?Med Vol 198, Iss 5, [id number], Jan 04 2017. ?2. Scattered small solid pulmonary nodules, largest 5 mm.  Follow-up ?noncontrast chest CT recommended in 12 months. This recommendation ?follows the consensus statement: Guidelines for Management of ?Incidental Pulmonary Nodules Detected on CT Images: From the ?Fleischner Society 2017; Radiology 2017; [phone]. ?3. Three-vessel coronary atherosclerosis. ?4. Chronic atrophy of pancreatic body and tail, progressive since ?2019 CT abdomen study, without discrete mass on this noncontrast CT, ?indeterminate but presumably due to progressive chronic ?pancreatitis. ?5. Aortic Atherosclerosis (ICD10-I70.0) and Emphysema (ICD10-J43.9). ?  ?  ?Electronically Signed ?  By: Ilona Sorrel M.D. ?  On: 10/03/2020 16:53 ? ?[facility] ? ?Results for SCOTTI, KOSTA" (MRN [id number]) as of 01/02/2021 14:44 ? Ref. Range 11/13/2020 16:09  ?Anti Nuclear Antibody (ANA) Latest Ref Range: NEGATIVE  NEGATIVE  ?Angiotensin-Converting Enzyme Latest Ref Range: 9 - 67 U/L 27  ?Cyclic Citrullin Peptide Ab Latest Units: UNITS <16  ?ds DNA Ab Latest Units: IU/mL <1  ?Myeloperoxidase Abs Latest Units: AI <1.0  ?Serine Protease 3 Latest Units: AI <1.0  ?RA Latex Turbid. Latest Ref Range: <14 IU/mL <14  ?SSA (Ro) (ENA) Antibody, IgG Latest Ref Range: <1.0 NEG AI <1.0 NEG  ?SSB (La) (ENA) Antibody, IgG Latest Ref Range: <1.0 NEG AI <1.0 NEG  ?Scleroderma (Scl-70) (ENA) Antibody, IgG Latest Ref Range: <1.0 NEG AI <1.0 NEG  ? ? ?eCHO arch 2019 ? ?Study Conclusions  ? ?- Left ventricle: The cavity size was normal. Wall thickness was  ?  normal. Systolic function was normal. The estimated ejection  ?  fraction was in the range of 55% to 60%. Wall motion was normal;  ?  there were no regional wall motion abnormalities. Doppler  ?  parameters are consistent with abnormal left ventricular  ?  relaxation (grade 1 diastolic dysfunction).  ?- Mitral valve: There was mild regurgitation.  ?- Pulmonary arteries: Systolic pressure was moderately increased.  ?  PA peak pressure: 40 mm Hg (S).  ? ? ? ?OV 02/14/2021 -  telephine visit ? ?Subjective:  ?Patient ID: Luke [name], male , DOB: 03-28-1946 , age 76 y.o. , MRN: [id number] , ADDRESS: [address] ?[place] [address] ?PCP Tonia Ghent, MD ?Patient Care Team: ?Tonia Ghent, MD as PCP - General ?Angelia Mould, MD (Vascular Surgery) ?Arta Silence, MD (Gastroenterology) ?Michael Boston, MD (General Surgery) ?Eustace Moore, MD (Neurosurgery) ?Juluis Rainier (Optometry) ?Jerline Pain, MD (Cardiology) ?[name], [facility] as Pharmacist (Pharmacist) ? ? ?Type of visit: Telephone/Video ?Circumstance: COVID-19 national emergency ?Identification of patient Luke [name] with October 27, 1945 and MRN [id number] - 2 person identifier ?Risks: Risks, benefits, limitations of telephone visit explained. Patient understood and verbalized agreement to proceed ?Anyone else on call: just patient ?Patient location: [phone] -  ?This provider location: [place], [place] ? ?This Provider for this visit: Treatment Team:  ?Attending Provider: Brand Males, MD ? ? ? ?02/14/2021 -  FU IPF with some emphysema. Has sCHF ? ?HPI ?Luke [name] 76 y.o. -telephone visit to  see how his esbriet started is going butt turns out he says he never got referred to pharmacist. Pharmacist confirmed that she is yet to meet iwht him. No esbriet started. Says even spirivan not started. Also, supposed to be telephone visit but got confusing call and he showed up at front desk only to be sent home. Apologized for gap in customer service. Overall well. He said after last vsit he read more about esbriet and got apprehensive esp in light of CKD but lab review shows GFR > 60 this year. Disucssed  ? ? Esbriet (Pirfenidone) would be the anti-fibrotic of choice - work probably with a charity program for a co-pay ? ?Instructions ?  - Slowly increase the dose per protocol ? -Always take it with food ? -Any nausea you can try ginger capsules ? -Give at least between 5 and 6  hours between dosing ? -Good idea to participate in [place] sponsored medication support program - this is optional ? -Definitely apply sunscreen when you go out with this medication ? - You

## 2021-08-20 ENCOUNTER — Encounter (HOSPITAL_COMMUNITY): Payer: Self-pay

## 2021-08-20 ENCOUNTER — Observation Stay (HOSPITAL_COMMUNITY)
Admission: EM | Admit: 2021-08-20 | Discharge: 2021-08-22 | Disposition: A | Discharge status: Home / Self Care | Payer: Medicare Other | Attending: Internal Medicine | Admitting: Internal Medicine

## 2021-08-20 ENCOUNTER — Emergency Department (HOSPITAL_COMMUNITY): Payer: Medicare Other

## 2021-08-20 DIAGNOSIS — N179 Acute kidney failure, unspecified: Secondary | ICD-10-CM | POA: Diagnosis not present

## 2021-08-20 DIAGNOSIS — I130 Hypertensive heart and chronic kidney disease with heart failure and stage 1 through stage 4 chronic kidney disease, or unspecified chronic kidney disease: Secondary | ICD-10-CM | POA: Insufficient documentation

## 2021-08-20 DIAGNOSIS — Z8679 Personal history of other diseases of the circulatory system: Secondary | ICD-10-CM

## 2021-08-20 DIAGNOSIS — Z87891 Personal history of nicotine dependence: Secondary | ICD-10-CM | POA: Diagnosis not present

## 2021-08-20 DIAGNOSIS — Z7984 Long term (current) use of oral hypoglycemic drugs: Secondary | ICD-10-CM | POA: Insufficient documentation

## 2021-08-20 DIAGNOSIS — E860 Dehydration: Secondary | ICD-10-CM

## 2021-08-20 DIAGNOSIS — E1159 Type 2 diabetes mellitus with other circulatory complications: Secondary | ICD-10-CM | POA: Insufficient documentation

## 2021-08-20 DIAGNOSIS — R072 Precordial pain: Secondary | ICD-10-CM | POA: Diagnosis not present

## 2021-08-20 DIAGNOSIS — J8410 Pulmonary fibrosis, unspecified: Secondary | ICD-10-CM | POA: Diagnosis not present

## 2021-08-20 DIAGNOSIS — I2510 Atherosclerotic heart disease of native coronary artery without angina pectoris: Secondary | ICD-10-CM | POA: Diagnosis present

## 2021-08-20 DIAGNOSIS — K529 Noninfective gastroenteritis and colitis, unspecified: Secondary | ICD-10-CM | POA: Diagnosis not present

## 2021-08-20 DIAGNOSIS — K6389 Other specified diseases of intestine: Secondary | ICD-10-CM | POA: Diagnosis not present

## 2021-08-20 DIAGNOSIS — Z7982 Long term (current) use of aspirin: Secondary | ICD-10-CM | POA: Diagnosis not present

## 2021-08-20 DIAGNOSIS — R1084 Generalized abdominal pain: Secondary | ICD-10-CM

## 2021-08-20 DIAGNOSIS — Z95810 Presence of automatic (implantable) cardiac defibrillator: Secondary | ICD-10-CM | POA: Diagnosis not present

## 2021-08-20 DIAGNOSIS — I714 Abdominal aortic aneurysm, without rupture, unspecified: Secondary | ICD-10-CM | POA: Diagnosis present

## 2021-08-20 DIAGNOSIS — N183 Chronic kidney disease, stage 3 unspecified: Secondary | ICD-10-CM | POA: Diagnosis present

## 2021-08-20 DIAGNOSIS — N1831 Chronic kidney disease, stage 3a: Secondary | ICD-10-CM | POA: Diagnosis not present

## 2021-08-20 DIAGNOSIS — E1169 Type 2 diabetes mellitus with other specified complication: Secondary | ICD-10-CM | POA: Diagnosis present

## 2021-08-20 DIAGNOSIS — Z9889 Other specified postprocedural states: Secondary | ICD-10-CM | POA: Diagnosis not present

## 2021-08-20 DIAGNOSIS — Z79899 Other long term (current) drug therapy: Secondary | ICD-10-CM | POA: Insufficient documentation

## 2021-08-20 DIAGNOSIS — I4891 Unspecified atrial fibrillation: Secondary | ICD-10-CM | POA: Diagnosis not present

## 2021-08-20 DIAGNOSIS — I5022 Chronic systolic (congestive) heart failure: Secondary | ICD-10-CM | POA: Diagnosis not present

## 2021-08-20 DIAGNOSIS — R7989 Other specified abnormal findings of blood chemistry: Secondary | ICD-10-CM

## 2021-08-20 DIAGNOSIS — R109 Unspecified abdominal pain: Secondary | ICD-10-CM | POA: Diagnosis not present

## 2021-08-20 DIAGNOSIS — I10 Essential (primary) hypertension: Secondary | ICD-10-CM | POA: Diagnosis present

## 2021-08-20 DIAGNOSIS — E785 Hyperlipidemia, unspecified: Secondary | ICD-10-CM | POA: Diagnosis present

## 2021-08-20 DIAGNOSIS — I700 Atherosclerosis of aorta: Secondary | ICD-10-CM | POA: Diagnosis not present

## 2021-08-20 DIAGNOSIS — J84112 Idiopathic pulmonary fibrosis: Secondary | ICD-10-CM | POA: Diagnosis present

## 2021-08-20 DIAGNOSIS — N281 Cyst of kidney, acquired: Secondary | ICD-10-CM | POA: Diagnosis not present

## 2021-08-20 DIAGNOSIS — R188 Other ascites: Secondary | ICD-10-CM | POA: Diagnosis not present

## 2021-08-20 LAB — COMPREHENSIVE METABOLIC PANEL
ALT: 17 U/L (ref 0–44)
AST: 20 U/L (ref 15–41)
Albumin: 4.2 g/dL (ref 3.5–5.0)
Alkaline Phosphatase: 62 U/L (ref 38–126)
Anion gap: 10 (ref 5–15)
BUN: 24 mg/dL — ABNORMAL HIGH (ref 8–23)
CO2: 24 mmol/L (ref 22–32)
Calcium: 9.6 mg/dL (ref 8.9–10.3)
Chloride: 102 mmol/L (ref 98–111)
Creatinine, Ser: 1.3 mg/dL — ABNORMAL HIGH (ref 0.61–1.24)
GFR, Estimated: 58 mL/min — ABNORMAL LOW (ref >60)
Glucose, Bld: 239 mg/dL — ABNORMAL HIGH (ref 70–99)
Potassium: 4.3 mmol/L (ref 3.5–5.1)
Sodium: 136 mmol/L (ref 135–145)
Total Bilirubin: 1 mg/dL (ref 0.3–1.2)
Total Protein: 7.6 g/dL (ref 6.5–8.1)

## 2021-08-20 LAB — CBC WITH DIFFERENTIAL/PLATELET
Abs Immature Granulocytes: 0.05 10*3/uL (ref 0.00–0.07)
Basophils Absolute: 0 10*3/uL (ref 0.0–0.1)
Basophils Relative: 0 %
Eosinophils Absolute: 0.1 10*3/uL (ref 0.0–0.5)
Eosinophils Relative: 1 %
HCT: 48.7 % (ref 39.0–52.0)
Hemoglobin: 16.9 g/dL (ref 13.0–17.0)
Immature Granulocytes: 0 %
Lymphocytes Relative: 6 %
Lymphs Abs: 0.7 10*3/uL (ref 0.7–4.0)
MCH: 33.9 pg (ref 26.0–34.0)
MCHC: 34.7 g/dL (ref 30.0–36.0)
MCV: 97.8 fL (ref 80.0–100.0)
Monocytes Absolute: 0.2 10*3/uL (ref 0.1–1.0)
Monocytes Relative: 1 %
Neutro Abs: 10.6 10*3/uL — ABNORMAL HIGH (ref 1.7–7.7)
Neutrophils Relative %: 92 %
Platelets: 276 10*3/uL (ref 150–400)
RBC: 4.98 MIL/uL (ref 4.22–5.81)
RDW: 12.6 % (ref 11.5–15.5)
WBC: 11.6 10*3/uL — ABNORMAL HIGH (ref 4.0–10.5)
nRBC: 0 % (ref 0.0–0.2)

## 2021-08-20 LAB — URINALYSIS, ROUTINE W REFLEX MICROSCOPIC
Bacteria, UA: NONE SEEN
Bilirubin Urine: NEGATIVE
Glucose, UA: NEGATIVE mg/dL
Hgb urine dipstick: NEGATIVE
Ketones, ur: NEGATIVE mg/dL
Leukocytes,Ua: NEGATIVE
Nitrite: NEGATIVE
Protein, ur: 30 mg/dL — AB
Specific Gravity, Urine: 1.018 (ref 1.005–1.030)
pH: 5 (ref 5.0–8.0)

## 2021-08-20 LAB — TROPONIN I (HIGH SENSITIVITY): Troponin I (High Sensitivity): 14 ng/L (ref <18)

## 2021-08-20 LAB — LIPASE, BLOOD: Lipase: 39 U/L (ref 11–51)

## 2021-08-20 NOTE — ED Provider Triage Note (Signed)
Emergency Medicine Provider Triage Evaluation Note ? ?Luke [name] , a 76 y.o. male  was evaluated in triage.  Pt complains of gradual onset, constant, achy, abdominal pain with radiation into chest that began earlier today. Pt also complains of nausea. No vomiting. Recent cardiac cath in March. Last bowel movement this AM.  ? ?Review of Systems  ?Positive: + abd pain, nausea, chest discomfort ?Negative: - SOB, vomiting, diarrhea, constipation ? ?Physical Exam  ?BP (!) 164/86   Pulse 81   Temp 99.1 ?F (37.3 ?C)   Resp 18   SpO2 92%  ?Gen:   Awake, no distress   ?Resp:  Normal effort  ?MSK:   Moves extremities without difficulty  ?Other:  Abd distended with diffuse abd TTP. 2+ radial pulse and 2+ PT pulses bilaterally.  ? ?Medical Decision Making  ?Medically screening exam initiated at 8:29 PM.  Appropriate orders placed.  Luke [name] was informed that the remainder of the evaluation will be completed by another provider, this initial triage assessment does not replace that evaluation, and the importance of remaining in the ED until their evaluation is complete. ? ? ?  ?Eustaquio Maize, PA-C ?08/20/21 2036 ? ?

## 2021-08-20 NOTE — ED Triage Notes (Signed)
Pt states that he had a heart cath several weeks ago and was told he had blockages and tonight began to have CP, abd pain and groin pain, denies SOB, some nausea.  ?

## 2021-08-21 ENCOUNTER — Other Ambulatory Visit: Payer: Self-pay

## 2021-08-21 ENCOUNTER — Encounter (HOSPITAL_COMMUNITY): Payer: Self-pay

## 2021-08-21 ENCOUNTER — Emergency Department (HOSPITAL_COMMUNITY): Payer: Medicare Other

## 2021-08-21 ENCOUNTER — Telehealth: Payer: Self-pay

## 2021-08-21 DIAGNOSIS — I700 Atherosclerosis of aorta: Secondary | ICD-10-CM | POA: Diagnosis not present

## 2021-08-21 DIAGNOSIS — N179 Acute kidney failure, unspecified: Secondary | ICD-10-CM | POA: Insufficient documentation

## 2021-08-21 DIAGNOSIS — N281 Cyst of kidney, acquired: Secondary | ICD-10-CM | POA: Diagnosis not present

## 2021-08-21 DIAGNOSIS — K529 Noninfective gastroenteritis and colitis, unspecified: Secondary | ICD-10-CM | POA: Diagnosis not present

## 2021-08-21 DIAGNOSIS — K6389 Other specified diseases of intestine: Secondary | ICD-10-CM | POA: Diagnosis not present

## 2021-08-21 DIAGNOSIS — K5289 Other specified noninfective gastroenteritis and colitis: Secondary | ICD-10-CM | POA: Diagnosis not present

## 2021-08-21 DIAGNOSIS — R188 Other ascites: Secondary | ICD-10-CM | POA: Diagnosis not present

## 2021-08-21 LAB — CBG MONITORING, ED
Glucose-Capillary: 336 mg/dL — ABNORMAL HIGH (ref 70–99)
Glucose-Capillary: 263 mg/dL — ABNORMAL HIGH (ref 70–99)

## 2021-08-21 LAB — GLUCOSE, CAPILLARY: Glucose-Capillary: 197 mg/dL — ABNORMAL HIGH (ref 70–99)

## 2021-08-21 LAB — TROPONIN I (HIGH SENSITIVITY)
Troponin I (High Sensitivity): 21 ng/L — ABNORMAL HIGH (ref <18)
Troponin I (High Sensitivity): 21 ng/L — ABNORMAL HIGH (ref <18)

## 2021-08-21 LAB — LACTIC ACID, PLASMA
Lactic Acid, Venous: 2.6 mmol/L (ref 0.5–1.9)
Lactic Acid, Venous: 2.8 mmol/L (ref 0.5–1.9)
Lactic Acid, Venous: 3.6 mmol/L (ref 0.5–1.9)
Lactic Acid, Venous: 2.4 mmol/L (ref 0.5–1.9)

## 2021-08-21 MED ORDER — UMECLIDINIUM BROMIDE 62.5 MCG/ACT IN AEPB
1.0000 | INHALATION_SPRAY | Freq: Every day | RESPIRATORY_TRACT | Status: DC
  Administered 2021-08-22: 1 via RESPIRATORY_TRACT
  Filled 2021-08-21: qty 7

## 2021-08-21 MED ORDER — INSULIN ASPART 100 UNIT/ML IJ SOLN
0.0000 [IU] | Freq: Three times a day (TID) | INTRAMUSCULAR | Status: DC
  Administered 2021-08-21: 5 [IU] via SUBCUTANEOUS
  Administered 2021-08-22: 1 [IU] via SUBCUTANEOUS
  Administered 2021-08-22: 2 [IU] via SUBCUTANEOUS

## 2021-08-21 MED ORDER — ALBUTEROL SULFATE (2.5 MG/3ML) 0.083% IN NEBU: 3.0000 mL | INHALATION_SOLUTION | Freq: Four times a day (QID) | RESPIRATORY_TRACT | Status: DC | PRN

## 2021-08-21 MED ORDER — ACETAMINOPHEN 650 MG RE SUPP: 650.0000 mg | Freq: Four times a day (QID) | RECTAL | Status: DC | PRN

## 2021-08-21 MED ORDER — SODIUM CHLORIDE 0.9% FLUSH
3.0000 mL | Freq: Two times a day (BID) | INTRAVENOUS | Status: DC
  Administered 2021-08-22: 3 mL via INTRAVENOUS

## 2021-08-21 MED ORDER — IOHEXOL 350 MG/ML SOLN
100.0000 mL | Freq: Once | INTRAVENOUS | Status: AC | PRN
  Administered 2021-08-21: 75 mL via INTRAVENOUS

## 2021-08-21 MED ORDER — SODIUM CHLORIDE 0.9 % IV BOLUS
500.0000 mL | Freq: Once | INTRAVENOUS | Status: AC
  Administered 2021-08-21: 500 mL via INTRAVENOUS

## 2021-08-21 MED ORDER — IOHEXOL 300 MG/ML  SOLN
80.0000 mL | Freq: Once | INTRAMUSCULAR | Status: AC | PRN
  Administered 2021-08-21: 80 mL via INTRAVENOUS

## 2021-08-21 MED ORDER — SODIUM CHLORIDE 0.9% FLUSH: 3.0000 mL | INTRAVENOUS | Status: DC | PRN

## 2021-08-21 MED ORDER — INSULIN GLARGINE-YFGN 100 UNIT/ML ~~LOC~~ SOLN: 35.0000 [IU] | Freq: Every day | SUBCUTANEOUS | Status: DC

## 2021-08-21 MED ORDER — MORPHINE SULFATE (PF) 2 MG/ML IV SOLN
1.0000 mg | INTRAVENOUS | Status: DC | PRN
Start: 2021-08-21 — End: 2021-08-22
  Administered 2021-08-21: 1 mg via INTRAVENOUS
  Filled 2021-08-21: qty 1

## 2021-08-21 MED ORDER — SODIUM CHLORIDE 0.9 % IV SOLN: 250.0000 mL | INTRAVENOUS | Status: DC | PRN

## 2021-08-21 MED ORDER — ONDANSETRON HCL 4 MG/2ML IJ SOLN
4.0000 mg | Freq: Four times a day (QID) | INTRAMUSCULAR | Status: DC | PRN
  Administered 2021-08-21: 4 mg via INTRAVENOUS
  Filled 2021-08-21: qty 2

## 2021-08-21 MED ORDER — CARVEDILOL 25 MG PO TABS
25.0000 mg | ORAL_TABLET | Freq: Two times a day (BID) | ORAL | Status: DC
  Administered 2021-08-22: 25 mg via ORAL
  Filled 2021-08-21: qty 8
  Filled 2021-08-21: qty 1

## 2021-08-21 MED ORDER — ISOSORBIDE MONONITRATE ER 60 MG PO TB24
60.0000 mg | ORAL_TABLET | Freq: Every day | ORAL | Status: DC
  Administered 2021-08-21 – 2021-08-22 (×2): 60 mg via ORAL
  Filled 2021-08-21: qty 2
  Filled 2021-08-21: qty 1

## 2021-08-21 MED ORDER — ONDANSETRON HCL 4 MG PO TABS
4.0000 mg | ORAL_TABLET | Freq: Four times a day (QID) | ORAL | Status: DC | PRN
Start: 2021-08-21 — End: 2021-08-22

## 2021-08-21 MED ORDER — LACTATED RINGERS IV SOLN: INTRAVENOUS | Status: DC

## 2021-08-21 MED ORDER — INSULIN GLARGINE 100 UNIT/ML SOLOSTAR PEN: 35.0000 [IU] | PEN_INJECTOR | Freq: Every day | SUBCUTANEOUS | Status: DC

## 2021-08-21 MED ORDER — SODIUM CHLORIDE 0.9 % IV SOLN: INTRAVENOUS | Status: AC

## 2021-08-21 MED ORDER — AMLODIPINE BESYLATE 10 MG PO TABS
10.0000 mg | ORAL_TABLET | Freq: Every day | ORAL | Status: DC
  Filled 2021-08-21: qty 2

## 2021-08-21 MED ORDER — PANTOPRAZOLE SODIUM 40 MG PO TBEC
80.0000 mg | DELAYED_RELEASE_TABLET | Freq: Every day | ORAL | Status: DC
  Administered 2021-08-21 – 2021-08-22 (×2): 80 mg via ORAL
  Filled 2021-08-21 (×2): qty 2

## 2021-08-21 MED ORDER — SIMVASTATIN 20 MG PO TABS
40.0000 mg | ORAL_TABLET | Freq: Every day | ORAL | Status: DC
  Administered 2021-08-21: 40 mg via ORAL
  Filled 2021-08-21: qty 2

## 2021-08-21 MED ORDER — SODIUM CHLORIDE 0.9 % IV SOLN: INTRAVENOUS | Status: DC

## 2021-08-21 MED ORDER — LACTATED RINGERS IV BOLUS
500.0000 mL | Freq: Once | INTRAVENOUS | Status: AC
Start: 2021-08-21 — End: 2021-08-21
  Administered 2021-08-21: 500 mL via INTRAVENOUS

## 2021-08-21 MED ORDER — INSULIN GLARGINE-YFGN 100 UNIT/ML ~~LOC~~ SOLN
25.0000 [IU] | Freq: Every day | SUBCUTANEOUS | Status: DC
  Administered 2021-08-21: 25 [IU] via SUBCUTANEOUS
  Filled 2021-08-21 (×2): qty 0.25

## 2021-08-21 MED ORDER — ASPIRIN EC 81 MG PO TBEC
81.0000 mg | DELAYED_RELEASE_TABLET | Freq: Every evening | ORAL | Status: DC
  Administered 2021-08-21: 81 mg via ORAL
  Filled 2021-08-21: qty 1

## 2021-08-21 MED ORDER — LACTATED RINGERS IV BOLUS
1000.0000 mL | Freq: Once | INTRAVENOUS | Status: AC
  Administered 2021-08-21: 1000 mL via INTRAVENOUS

## 2021-08-21 MED ORDER — ENOXAPARIN SODIUM 40 MG/0.4ML IJ SOSY
40.0000 mg | PREFILLED_SYRINGE | INTRAMUSCULAR | Status: DC
  Administered 2021-08-21: 40 mg via SUBCUTANEOUS
  Filled 2021-08-21: qty 0.4

## 2021-08-21 MED ORDER — ACETAMINOPHEN 325 MG PO TABS
650.0000 mg | ORAL_TABLET | Freq: Four times a day (QID) | ORAL | Status: DC | PRN
  Administered 2021-08-21: 650 mg via ORAL
  Filled 2021-08-21 (×2): qty 2

## 2021-08-21 NOTE — Assessment & Plan Note (Signed)
Follows with vascular surgery ?-EVAR duplex demonstrates a stable aneurysmal sac without any endoleaks on routine f/u in 04/2021 ?-continue yearly f/u as recommended  ?

## 2021-08-21 NOTE — Assessment & Plan Note (Addendum)
76 year old male presenting with  History of nausea with intermittent abdominal pain radiating into chest and elevated lactic acid  ?-obs to telemetry  ?-no mesenteric ischemia ?-received 2L IVF in ED, will repeat lactic acid and if elevated give gentle time limited IVF. EF of 40-45% ?-no diarrhea or vomiting, no blood in stool, nausea resolved.  ?-tolerating PO, but making NPO. Had lunch in ED  ?-likely viral, hold on any abx at this time and monitor  ?-lactic acid trending upward.. called and discussed with general surgery with imaging and they will see ?-blood cultures ?-pain meds prn ?-anti emetics prn  ?

## 2021-08-21 NOTE — Progress Notes (Signed)
Lantus shipment sent to wrong office location.Per [employer], re-order form needs completion. Line 3 requires current shipping location and needs to be corrected. Once complete refax to [employer] and they will process the medication and resend. ?Re-order form coming to [facility] office fax#. ? ?Charlene Brooke, CPP notified ? ?Chi Garlow, CCMA ?Health concierge  ?[phone]  ?

## 2021-08-21 NOTE — Assessment & Plan Note (Signed)
Continue zocor 

## 2021-08-21 NOTE — H&P (Addendum)
?History and Physical  ? ? ?Patient: Luke [name] DOB: Sep 29, 1945 ?DOA: 08/20/2021 ?DOS: the patient was seen and examined on 08/21/2021 ?PCP: Tonia Ghent, MD  ?Patient coming from: Home - lives with his wife  ? ? ?Chief Complaint: nausea, abdominal pain and chest pain  ? ?HPI: Luke [name] is a 76 y.o. male with medical history significant of T2DM, HLD, HTN, CAD, systolic CHF, ICD secondary to CHB in setting of ischemic cardiomyopathy, pulmonary fibrosis, AAA s/p endovascular repair, PAD who presents to ED with complaints of nausea with stomach pain that radiated up into his chest. He states symptoms started yesterday. Stomach pain was generalized, but worse in RLQ. Would come in waves with pain rated as an 8/10 at its worse and described as dull. Pain radiated up into chest with pressure like sensation. He would burp and pain would go away. He states he never had chest pain alone, no palpitations, no pain when on the treadmill. He denies any vomiting or diarrhea. He has had normal BM with regular stool. Denies any blood. He had poor PO intake yesterday and since being in hospital since last night has not eaten much. Denies any fever/chills. Overall he he has been feeling well until yesterday.  ? ?History of umbilical hernia repair, no other surgeries  ? ?He has been feeling good. Denies any fever/chills, vision changes/headaches, chest pain or palpitations, shortness of breath or cough, V/D, dysuria or leg swelling.  ? ? ?Last colonoscopy 04/21/2019. Polyps removed +tubular adenoma.  ? ? ? ?ER Course:  vitals: temp: 99.1, bp: 164/86, HR: 81, RR: 18, oxygen: 92%>88% on RA then placed on 4L [place].>100%on RA  ?Pertinent labs: wbc: 11.6, BUN: 24, creatinine: 1.30, troponin: 14>21>21, lactic acid: 2.6>2.8 ?CT abdomen pelvis: segment of small bowel shows thickening with prominent congestion/edema within the subtending mesentery. No discrete transition zone although small bowel distal to this is  decompressed. May be related to an infectious inflammatory enteritis.  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 ?Widely patent SMA and celiac axis; proximal occlusive mesenteric ?ischemia is unlikely. ?3. Proximal and mid small bowel distension with wall thickening and ?enhancement consistent with enteritis as before. ?In ED: given 2L bolus total.  ? ? ?Review of Systems: As mentioned in the history of present illness. All other systems reviewed and are negative. ?Past Medical History:  ?Diagnosis Date  ? AAA (abdominal aortic aneurysm) ([place]) 2011  ? Per vascular surgery  ? Anxiety   ? no med. in use for anxiety but pt. speaks openly of his stress & anxious feelings regarding impending surgery    ? Atrial fibrillation ([place])   ? Automatic implantable cardioverter-defibrillator in situ   ? CAD (coronary artery disease)   ? Presumed CAD with nuclear scan October 09, 2011,  large anteroseptal MI and inferior MI. Catheterization scheduled October 15, 2011  ? Cardiomyopathy ([place])   ? Nuclear, October 09, 2011, EF 30%, multiple focal wall motion abnormalities  ? Diabetes mellitus   ? type II  ? Drug therapy   ? Hyperkalemia with spironolactone  ? Ejection fraction < 50%   ? EF 30%, nuclear, October 09, 2011  ? Ejection fraction < 50%   ? EF previously 30%  //   EF 30-35%, echo, July 14, 2012, severe diffuse hypokinesis, PA pressure 43 mm mercury  ? Fall due to ice or snow Feb.  19, 2015  ? HLD (hyperlipidemia)   ? Hypertension   ?  white coat HTN-- often elevated in office and controlled on outside checks.  ? ICD (implantable cardioverter-defibrillator) in place   ? CRT-D placed March, 2014 complete heart block and k dysfunction  ? LBBB (left bundle branch block)   ? LBBB on EKG October 11, 2011,  no prior EKG has been done  ? Low testosterone   ? Hx of  ? Myocardial infarction [facility])   ? Told in 2015-never knew-showed up on stress test for AAA;   ? Pacemaker   ? PAD (peripheral artery disease) ([place])   ?  Pancreatitis   ? ?Past Surgical History:  ?Procedure Laterality Date  ? ABDOMINAL AORTAGRAM N/A 10/28/2011  ? Procedure: ABDOMINAL AORTAGRAM;  Surgeon: Angelia Mould, MD;  Location: [facility] CATH LAB;  Service: Cardiovascular;  Laterality: N/A;  ? ABDOMINAL AORTIC ANEURYSM REPAIR    ? EVAR   ? BI-VENTRICULAR IMPLANTABLE CARDIOVERTER DEFIBRILLATOR N/A 07/16/2012  ? Procedure: BI-VENTRICULAR IMPLANTABLE CARDIOVERTER DEFIBRILLATOR  (CRT-D);  Surgeon: Deboraha Sprang, MD;  Location: [facility] CATH LAB;  Service: Cardiovascular;  Laterality: N/A;  ? BIV ICD GENERATOR CHANGEOUT N/A 10/25/2020  ? Procedure: BIV ICD GENERATOR CHANGEOUT;  Surgeon: Deboraha Sprang, MD;  Location: [place] CV LAB;  Service: Cardiovascular;  Laterality: N/A;  ? CARDIAC CATHETERIZATION    ? COLONOSCOPY  03/23/2018; 2020  ? ESOPHAGOGASTRODUODENOSCOPY N/A 02/12/2019  ? Procedure: ESOPHAGOGASTRODUODENOSCOPY (EGD);  Surgeon: Jackquline Denmark, MD;  Location: [facility] ENDOSCOPY;  Service: Endoscopy;  Laterality: N/A;  ? ESOPHAGOGASTRODUODENOSCOPY (EGD) WITH PROPOFOL N/A 02/05/2018  ? Procedure: ESOPHAGOGASTRODUODENOSCOPY (EGD) WITH PROPOFOL;  Surgeon: Mauri Pole, MD;  Location: [facility] ENDOSCOPY;  Service: Endoscopy;  Laterality: N/A;  ? ESOPHAGOGASTRODUODENOSCOPY (EGD) WITH PROPOFOL N/A 09/27/2018  ? Procedure: ESOPHAGOGASTRODUODENOSCOPY (EGD) WITH PROPOFOL;  Surgeon: Carol Ada, MD;  Location: [place];  Service: Endoscopy;  Laterality: N/A;  ? FOREIGN BODY REMOVAL  09/27/2018  ? Procedure: FOREIGN BODY REMOVAL;  Surgeon: Carol Ada, MD;  Location: [place];  Service: Endoscopy;;  ? FOREIGN BODY REMOVAL  02/12/2019  ? Procedure: FOREIGN BODY REMOVAL;  Surgeon: Jackquline Denmark, MD;  Location: [facility] ENDOSCOPY;  Service: Endoscopy;;  ? HERNIA REPAIR  Jan. 9, 2015  ? INSERTION OF MESH N/A 05/14/2013  ? Procedure: INSERTION OF MESH;  Surgeon: Adin Hector, MD;  Location: [place];  Service: General;  Laterality: N/A;  ? LEFT HEART CATH AND CORONARY ANGIOGRAPHY  N/A 07/17/2021  ? Procedure: LEFT HEART CATH AND CORONARY ANGIOGRAPHY;  Surgeon: Troy Sine, MD;  Location: [place] CV LAB;  Service: Cardiovascular;  Laterality: N/A;  ? LOWER EXTREMITY ANGIOGRAM Bilateral 10/28/2011  ? Procedure: LOWER EXTREMITY ANGIOGRAM;  Surgeon: Angelia Mould, MD;  Location: [facility] CATH LAB;  Service: Cardiovascular;  Laterality: Bilateral;  ? PACEMAKER INSERTION  07-16-12  ? pacemaker/defibrilator  ? POLYPECTOMY    ? POSTERIOR CERVICAL FUSION/FORAMINOTOMY N/A 06/09/2014  ? Procedure: Laminectomy - Cervical two-Cervcial four posterior cervical instrumented fusion Cervical two-cervical four;  Surgeon: Eustace Moore, MD;  Location: [place] NEURO ORS;  Service: Neurosurgery;  Laterality: N/A;  posterior ?  ? SAVORY DILATION N/A 02/05/2018  ? Procedure: SAVORY DILATION;  Surgeon: Mauri Pole, MD;  Location: [facility] ENDOSCOPY;  Service: Endoscopy;  Laterality: N/A;  ? TONSILLECTOMY    ? as a child   ? UMBILICAL HERNIA REPAIR N/A 05/14/2013  ? Procedure: LAPAROSCOPIC exploration and repair of hernia in abdominal ;  Surgeon: Adin Hector, MD;  Location: [place];  Service: General;  Laterality: N/A;  ?  UPPER GASTROINTESTINAL ENDOSCOPY  2020  ? ?Social History:  reports that he quit smoking about 17 years ago. His smoking use included cigarettes. He started smoking about 59 years ago. He has a 80.00 pack-year smoking history. He has never used smokeless tobacco. He reports current alcohol use. He reports that he does not use drugs. ? ?Allergies  ?Allergen Reactions  ? Aldactone [Spironolactone] Other (See Comments)  ?  Hyperkalemia  ? Codeine Rash and Hives  ? Atorvastatin   ?  Myalgias with lipitor.  Does tolerate simvastatin.    ? Januvia [Sitagliptin] Other (See Comments)  ?  Diarrhea and heart racing  ? Jardiance [Empagliflozin] Other (See Comments)  ?  Polyuria; excessive weight loss  ? Lisinopril   ?  Possible cause of pancreatitis  ? ? ?Family History  ?Problem Relation Age of Onset  ?  Hypertension Mother   ? Stroke Mother   ? Hyperlipidemia Mother   ? Diabetes Sister   ? Heart disease Sister   ?     Before age 38  ? Hypertension Sister   ? Hyperlipidemia Sister   ? Heart attack Sister   ? Lung c

## 2021-08-21 NOTE — Assessment & Plan Note (Addendum)
Well controlled on average and has been quite good this afternoon despite not taking any medication.  ?Continue home medication including norvasc, coreg, hold hydralazine for now.  ?

## 2021-08-21 NOTE — Assessment & Plan Note (Addendum)
Appears euvolemic to dry, no evidence of exacerbation  ?Recent echo: 3/23: EF of 40-45% with inferior wall and posterior wall akinesis, grade 1 DD. Mild aortic sclerosis  ?Strict I/O ?Continue medical management  ?

## 2021-08-21 NOTE — ED Provider Notes (Addendum)
Pt had been signed out by Dr Leonette Monarch at 0800, that pt with abd pain, abd imaging study, and that lactate was pending - he indicates if lactate high, get ct angio abd.  ? ?Lactate elevated. LR bolus. Abd soft, mid abd tenderness, no peritonitis. CTA ordered per Dr Wilma Flavin plan. Patient updated. Pt declines additional pain med currently. Currently denies chest pain or sob.  ?  ?Lajean Saver, MD ?08/21/21 0845 ? ?CT resulted, no mesenteric ischemia or large vessel occlusion noted.  ? ?Given elevated lactate, abd pain/enteritis, recent chest pain w hx cad - will admit to medicine.  ? ?Additional ivf.  ? ?Hospitalists consulted for admission.  ? ? ?  ?Lajean Saver, MD ?08/21/21 1133 ? ?

## 2021-08-21 NOTE — Assessment & Plan Note (Addendum)
Follows with pulmonology ?Continue incruse daily and SABA, although he tells me he has never had to use this  ?Recently had PFTs done  ?esbriet on hold until after may 2023  ?

## 2021-08-21 NOTE — Assessment & Plan Note (Addendum)
Troponin overall flat and ekg with no significant changes ?Chest pain history appears more consistent with pain from stomach radiating into chest. He denies any exertion chest pain and has been chest pain free since being in ED.  ?Recent LHC on 07/2021: The angiographic findings were reviewed with the catheterization from 2013.  The LAD stenosis is not significantly changed except is more calcified.  The RCA occlusion is new.  I suspect the recent exertional symptomatology is due to RCA occlusion with chest pain developing after walking approximately 20 minutes on a treadmill.  I discussed the findings with Dr. Marlou Porch.  We will plan will be an increase medical regimen with further titration of isosorbide and possible initiation of Ranexa.   If he develops increasing symptomatology, consideration for surgical revascularization and/or orbital atherectomy to the calcified LAD stenosis can be considered. ?-continue medical management with zocor, coreg, imdur, ASA  ?-observation overnight, if any chest pain would discuss with cards, but again seems more referred pain from his stomach  ? ?

## 2021-08-21 NOTE — Consult Note (Addendum)
Surgical Evaluation ?Requesting provider: Orma Flaming MD ? ?Chief Complaint: abdominal pain ? ?HPI: Very pleasant 76 year old man who presented yesterday with abdominal pain.  He has multiple significant medical problems including diabetes, hypertension, coronary artery disease, systolic heart failure, ischemic cardiomyopathy, ICD in place, idiopathic pulmonary fibrosis, AAA status post EVAR, peripheral arterial disease.  He was in his usual state of health until yesterday after breakfast.  Notes cramping sensation that begins in the mid abdomen, lasts about 30 seconds, rates at an 8 out of 10, and is decreasing in frequency over the last 24 hours.  Some relief when he belches.  Yesterday the pain was more generalized and worse in the right lower quadrant.  This evening he feels that his symptoms are actually significantly improved compared to yesterday. ?He denies any nausea.  He did have some loose bowel movements yesterday but denies any significant diarrhea and denies any melena or hematochezia.  Reports bowel movements today are normal.  Denies any known fevers. ?Reports previous umbilical hernia repair with mesh but no other abdominal surgery. ?CT scans as below demonstrate a segment of jejunum concerning for enteritis, patent mesenteric vasculature.  ?Given symptomatic improvement, the initial plan had been to discharge him home today however his lactic acid was noted to be uptrending slightly and general surgery has been consulted for further recommendations. ? ?Allergies  ?Allergen Reactions  ? Aldactone [Spironolactone] Other (See Comments)  ?  Hyperkalemia  ? Codeine Rash and Hives  ? Atorvastatin   ?  Myalgias with lipitor.  Does tolerate simvastatin.    ? Januvia [Sitagliptin] Other (See Comments)  ?  Diarrhea and heart racing  ? Jardiance [Empagliflozin] Other (See Comments)  ?  Polyuria; excessive weight loss  ? Lisinopril   ?  Possible cause of pancreatitis  ? ? ?Past Medical History:  ?Diagnosis  Date  ? AAA (abdominal aortic aneurysm) ([place]) 2011  ? Per vascular surgery  ? Anxiety   ? no med. in use for anxiety but pt. speaks openly of his stress & anxious feelings regarding impending surgery    ? Atrial fibrillation ([place])   ? Automatic implantable cardioverter-defibrillator in situ   ? CAD (coronary artery disease)   ? Presumed CAD with nuclear scan October 09, 2011,  large anteroseptal MI and inferior MI. Catheterization scheduled October 15, 2011  ? Cardiomyopathy ([place])   ? Nuclear, October 09, 2011, EF 30%, multiple focal wall motion abnormalities  ? Diabetes mellitus   ? type II  ? Drug therapy   ? Hyperkalemia with spironolactone  ? Ejection fraction < 50%   ? EF 30%, nuclear, October 09, 2011  ? Ejection fraction < 50%   ? EF previously 30%  //   EF 30-35%, echo, July 14, 2012, severe diffuse hypokinesis, PA pressure 43 mm mercury  ? Fall due to ice or snow Feb.  19, 2015  ? HLD (hyperlipidemia)   ? Hypertension   ? white coat HTN-- often elevated in office and controlled on outside checks.  ? ICD (implantable cardioverter-defibrillator) in place   ? CRT-D placed March, 2014 complete heart block and k dysfunction  ? LBBB (left bundle branch block)   ? LBBB on EKG October 11, 2011,  no prior EKG has been done  ? Low testosterone   ? Hx of  ? Myocardial infarction [facility])   ? Told in 2015-never knew-showed up on stress test for AAA;   ? Pacemaker   ? PAD (peripheral artery disease) ([place])   ?  Pancreatitis   ? ? ?Past Surgical History:  ?Procedure Laterality Date  ? ABDOMINAL AORTAGRAM N/A 10/28/2011  ? Procedure: ABDOMINAL AORTAGRAM;  Surgeon: Angelia Mould, MD;  Location: [facility] CATH LAB;  Service: Cardiovascular;  Laterality: N/A;  ? ABDOMINAL AORTIC ANEURYSM REPAIR    ? EVAR   ? BI-VENTRICULAR IMPLANTABLE CARDIOVERTER DEFIBRILLATOR N/A 07/16/2012  ? Procedure: BI-VENTRICULAR IMPLANTABLE CARDIOVERTER DEFIBRILLATOR  (CRT-D);  Surgeon: Deboraha Sprang, MD;  Location: [facility] CATH LAB;  Service: Cardiovascular;  Laterality: N/A;   ? BIV ICD GENERATOR CHANGEOUT N/A 10/25/2020  ? Procedure: BIV ICD GENERATOR CHANGEOUT;  Surgeon: Deboraha Sprang, MD;  Location: [place] CV LAB;  Service: Cardiovascular;  Laterality: N/A;  ? CARDIAC CATHETERIZATION    ? COLONOSCOPY  03/23/2018; 2020  ? ESOPHAGOGASTRODUODENOSCOPY N/A 02/12/2019  ? Procedure: ESOPHAGOGASTRODUODENOSCOPY (EGD);  Surgeon: Jackquline Denmark, MD;  Location: [facility] ENDOSCOPY;  Service: Endoscopy;  Laterality: N/A;  ? ESOPHAGOGASTRODUODENOSCOPY (EGD) WITH PROPOFOL N/A 02/05/2018  ? Procedure: ESOPHAGOGASTRODUODENOSCOPY (EGD) WITH PROPOFOL;  Surgeon: Mauri Pole, MD;  Location[facility] ENDOSCOPY;  Service: Endoscopy;  Laterality: N/A;  ? ESOPHAGOGASTRODUODENOSCOPY (EGD) WITH PROPOFOL N/A 09/27/2018  ? Procedure: ESOPHAGOGASTRODUODENOSCOPY (EGD) WITH PROPOFOL;  Surgeon: Carol Ada, MD;  Location: [place];  Service: Endoscopy;  Laterality: N/A;  ? FOREIGN BODY REMOVAL  09/27/2018  ? Procedure: FOREIGN BODY REMOVAL;  Surgeon: Carol Ada, MD;  Location: [place];  Service: Endoscopy;;  ? FOREIGN BODY REMOVAL  02/12/2019  ? Procedure: FOREIGN BODY REMOVAL;  Surgeon: Jackquline Denmark, MD;  Location: [facility] ENDOSCOPY;  Service: Endoscopy;;  ? HERNIA REPAIR  Jan. 9, 2015  ? INSERTION OF MESH N/A 05/14/2013  ? Procedure: INSERTION OF MESH;  Surgeon: Adin Hector, MD;  Location: [place];  Service: General;  Laterality: N/A;  ? LEFT HEART CATH AND CORONARY ANGIOGRAPHY N/A 07/17/2021  ? Procedure: LEFT HEART CATH AND CORONARY ANGIOGRAPHY;  Surgeon: Troy Sine, MD;  Location: [place] CV LAB;  Service: Cardiovascular;  Laterality: N/A;  ? LOWER EXTREMITY ANGIOGRAM Bilateral 10/28/2011  ? Procedure: LOWER EXTREMITY ANGIOGRAM;  Surgeon: Angelia Mould, MD;  Location: [facility] CATH LAB;  Service: Cardiovascular;  Laterality: Bilateral;  ? PACEMAKER INSERTION  07-16-12  ? pacemaker/defibrilator  ? POLYPECTOMY    ? POSTERIOR CERVICAL FUSION/FORAMINOTOMY N/A 06/09/2014  ? Procedure: Laminectomy -  Cervical two-Cervcial four posterior cervical instrumented fusion Cervical two-cervical four;  Surgeon: Eustace Moore, MD;  Location: [place] NEURO ORS;  Service: Neurosurgery;  Laterality: N/A;  posterior ?  ? SAVORY DILATION N/A 02/05/2018  ? Procedure: SAVORY DILATION;  Surgeon: Mauri Pole, MD;  Location: [facility] ENDOSCOPY;  Service: Endoscopy;  Laterality: N/A;  ? TONSILLECTOMY    ? as a child   ? UMBILICAL HERNIA REPAIR N/A 05/14/2013  ? Procedure: LAPAROSCOPIC exploration and repair of hernia in abdominal ;  Surgeon: Adin Hector, MD;  Location: [place];  Service: General;  Laterality: N/A;  ? UPPER GASTROINTESTINAL ENDOSCOPY  2020  ? ? ?Family History  ?Problem Relation Age of Onset  ? Hypertension Mother   ? Stroke Mother   ? Hyperlipidemia Mother   ? Diabetes Sister   ? Heart disease Sister   ?     Before age 47  ? Hypertension Sister   ? Hyperlipidemia Sister   ? Heart attack Sister   ? Lung cancer Father   ? Hypertension Son   ? Colon cancer Neg Hx   ? Prostate cancer Neg Hx   ? Esophageal cancer  Neg Hx   ? Stomach cancer Neg Hx   ? Rectal cancer Neg Hx   ? Colon polyps Neg Hx   ? ? ?Social History  ? ?Socioeconomic History  ? Marital status: Married  ?  Spouse name: Precious Bard  ? Number of children: 1  ? Years of education: Not on file  ? Highest education level: Not on file  ?Occupational History  ? Occupation: retired  ?Tobacco Use  ? Smoking status: Former  ?  Packs/day: 2.00  ?  Years: 40.00  ?  Pack years: 80.00  ?  Types: Cigarettes  ?  Start date: 1964  ?  Quit date: 05/06/2004  ?  Years since quitting: 17.3  ? Smokeless tobacco: Never  ?Vaping Use  ? Vaping Use: Never used  ?Substance and Sexual Activity  ? Alcohol use: Yes  ?  Alcohol/week: 0.0 - 2.0 standard drinks  ?  Comment: beer or wine  occassionally  ? Drug use: No  ? Sexual activity: Yes  ?  Partners: Female  ?Other Topics Concern  ? Not on file  ?Social History Narrative  ? [place] vet, he has known service related agent orange exposure.   ?  Retired  ? Exercises daily.    ? ?Social Determinants of Health  ? ?Financial Resource Strain: Low Risk   ? Difficulty of Paying Living Expenses: Not very hard  ?Food Insecurity: Not on file  ?[employer]portation [employer]

## 2021-08-21 NOTE — Assessment & Plan Note (Addendum)
A1C of 7.8 in 04/2021 ?While NPO decrease lantus to 25 units from 35 units  ?Hold glucotrol +metformin  ?SSI and accuchecks qac/hs  ?

## 2021-08-21 NOTE — Assessment & Plan Note (Addendum)
Has hx of CKD stage 3; however, GFR was >60 over past year so this appears to be more AKI. ?Pre renal in setting of poor PO intake and gastroenteritis ?Received 2L IVF in ED ?Strict I/O ?UA unremarkable except for protein of 30 ?Hold nephrotoxic drugs ?Trend  ? ?

## 2021-08-21 NOTE — ED Notes (Signed)
Placed pt on 4L 02 per [place] and Sa02 97% ?

## 2021-08-21 NOTE — ED Notes (Signed)
MD at bedside pt will be awaiting surgery consult. Pt NPO at this time ? ?

## 2021-08-21 NOTE — ED Provider Notes (Signed)
?[facility] ?Provider Note ? ?CSN: [id number] ?Arrival date & time: 08/20/21 2001 ? ?Chief Complaint(s) ?Abdominal Pain and Chest Pain ? ?HPI ?Luke [name] is a 76 y.o. male with extensive past medical history listed below including hypertension, hyperlipidemia, diabetes, A-fib, intermittent heart block status post pacemaker, CAD who had a cath done last month noting stable RCA and LAD calcification and currently being medically managed. ? ?Patient presented with abdominal discomfort that began earlier this afternoon after having a bowel movement.  It was initially mild and gradually worsened throughout the day.,  Aching and cramping.  Fluctuating in nature.  Associated with nausea no emesis.  No diarrhea.  Since his last bowel movement when the pain started patient has not been able to pass gas or has not had a bowel movement.  Patient states that he feels bloated.  Attempted to take Pepto-Bismol and Gas-X without relief. ? ?Additionally patient reported having chest pressure that radiated to the left arm.  This improved with isosorbide recently prescribed him. ? ?The history is provided by the patient.  ? ?Past Medical History ?Past Medical History:  ?Diagnosis Date  ? AAA (abdominal aortic aneurysm) ([place]) 2011  ? Per vascular surgery  ? Anxiety   ? no med. in use for anxiety but pt. speaks openly of his stress & anxious feelings regarding impending surgery    ? Atrial fibrillation ([place])   ? Automatic implantable cardioverter-defibrillator in situ   ? CAD (coronary artery disease)   ? Presumed CAD with nuclear scan October 09, 2011,  large anteroseptal MI and inferior MI. Catheterization scheduled October 15, 2011  ? Cardiomyopathy ([place])   ? Nuclear, October 09, 2011, EF 30%, multiple focal wall motion abnormalities  ? Diabetes mellitus   ? type II  ? Drug therapy   ? Hyperkalemia with spironolactone  ? Ejection fraction < 50%   ? EF 30%, nuclear, October 09, 2011  ? Ejection fraction <  50%   ? EF previously 30%  //   EF 30-35%, echo, July 14, 2012, severe diffuse hypokinesis, PA pressure 43 mm mercury  ? Fall due to ice or snow Feb.  19, 2015  ? HLD (hyperlipidemia)   ? Hypertension   ? white coat HTN-- often elevated in office and controlled on outside checks.  ? ICD (implantable cardioverter-defibrillator) in place   ? CRT-D placed March, 2014 complete heart block and k dysfunction  ? LBBB (left bundle branch block)   ? LBBB on EKG October 11, 2011,  no prior EKG has been done  ? Low testosterone   ? Hx of  ? Myocardial infarction [facility])   ? Told in 2015-never knew-showed up on stress test for AAA;   ? Pacemaker   ? PAD (peripheral artery disease) ([place])   ? Pancreatitis   ? ?Patient Active Problem List  ? Diagnosis Date Noted  ? Exertional angina (H[facility])   ? IPF (idiopathic pulmonary fibrosis) ([place]) 03/21/2021  ? Abnormal chest x-ray 09/17/2020  ? Pain due to onychomycosis of toenails of both feet 08/10/2019  ? Chronic systolic heart failure ([place]) 06/08/2019  ? Biventricular ICD (implantable cardioverter-defibrillator) in place 06/08/2019  ? Esophageal obstruction due to food impaction   ? Esophageal stricture   ? Dysphagia 11/19/2017  ? CKD (chronic kidney disease), stage III ([place]) 11/10/2017  ? Pancreatitis 11/10/2017  ? Healthcare maintenance 11/22/2016  ? Tinnitus 05/23/2015  ? Medicare annual wellness visit, subsequent 10/20/2014  ? Advance care planning 10/20/2014  ?  S/P cervical spinal fusion 06/09/2014  ? Stenosis of cervical spine with myelopathy ([place]) 12/27/2013  ? Preop cardiovascular exam 08/13/2013  ? Central cord syndrome ([place]) 06/24/2013  ? Encounter for fitting or adjustment of automatic implantable cardioverter-defibrillator 04/20/2013  ? Drug therapy   ? Status post abdominal aortic aneurysm repair 12/30/2012  ? HLD (hyperlipidemia) 09/16/2012  ? Ejection fraction < 50%   ? Complete heart block ([place]) 07/14/2012  ? SK (seborrheic keratosis) 02/12/2012  ? Shoulder pain 11/08/2011  ?  CAD (coronary artery disease)   ? Ischemic cardiomyopathy   ? LBBB (left bundle branch block)   ? PVD (peripheral vascular disease) ([place]) 10/08/2010  ? ORGANIC IMPOTENCE 07/05/2010  ? Essential hypertension, benign 02/27/2010  ? Type 2 diabetes mellitus with vascular disease ([place]) 11/16/2009  ? AAA (abdominal aortic aneurysm) ([place]) 05/06/2009  ? ?Home Medication(s) ?Prior to Admission medications   ?Medication Sig Start Date End Date Taking? Authorizing Provider  ?albuterol (VENTOLIN HFA) 108 (90 Base) MCG/ACT inhaler Inhale 2 puffs into the lungs every 6 (six) hours as needed for wheezing or shortness of breath. 01/02/21   Brand Males, MD  ?amLODipine (NORVASC) 10 MG tablet TAKE 1 TABLET(10 MG) BY MOUTH DAILY ?Patient taking differently: Take 10 mg by mouth daily with supper. 02/15/21   Jerline Pain, MD  ?aspirin EC 81 MG tablet Take 81 mg by mouth every evening.    [provider]  ?carvedilol (COREG) 25 MG tablet TAKE 1 TABLET BY MOUTH TWICE DAILY WITH MEALS 03/09/21   Jerline Pain, MD  ?cholecalciferol (VITAMIN D) 25 MCG (1000 UNIT) tablet Take 1,000 Units by mouth in the morning.    [provider]  ?glipiZIDE (GLUCOTROL) 5 MG tablet TAKE 1 TABLET(5 MG) BY MOUTH TWICE DAILY BEFORE A MEAL 07/02/21   Tonia Ghent, MD  ?glucose blood (ACCU-CHEK AVIVA PLUS) test strip USE TO CHECK BLOOD GLUCOSE UP TO TWICE DAILY. 01/26/21   Tonia Ghent, MD  ?hydrALAZINE (APRESOLINE) 25 MG tablet TAKE 1 TABLET(25 MG) BY MOUTH THREE TIMES DAILY 07/02/21   Tonia Ghent, MD  ?insulin glargine (LANTUS SOLOSTAR) 100 UNIT/ML Solostar Pen INJECT 0.3 TO 0.35 MLS(30 TO 35 UNITS) INTO THE SKIN EVERY DAY ?Patient taking differently: Inject 30 Units into the skin at bedtime. 03/29/20   Tonia Ghent, MD  ?isosorbide mononitrate (IMDUR) 30 MG 24 hr tablet Take 1 tablet (30 mg total) by mouth daily. ?Patient taking differently: Take 60 mg by mouth daily. 07/11/21   Jerline Pain, MD  ?metFORMIN (GLUCOPHAGE)  500 MG tablet TAKE 2 TABLETS BY MOUTH TWICE DAILY WITH FOOD 04/30/21   Tonia Ghent, MD  ?Multiple Vitamin (MULTIVITAMIN WITH MINERALS) TABS tablet Take 1 tablet by mouth every evening.    [provider]  ?omeprazole (PRILOSEC) 40 MG capsule TAKE 1 CAPSULE(40 MG) BY MOUTH TWICE DAILY ?Patient taking differently: Take 40 mg by mouth in the morning. 06/21/21   Mauri Pole, MD  ?Pirfenidone (ESBRIET) 267 MG TABS 2 tabs three times daily. ?Patient not taking: Reported on 08/08/2021 04/13/21   Tonia Ghent, MD  ?sildenafil (REVATIO) 20 MG tablet TAKE 3 TO 4 TABLETS BY MOUTH DAILY AS NEEDED ?Patient taking differently: Take 20 mg by mouth daily as needed (erectile dysfunction.). 06/30/20   Tonia Ghent, MD  ?simvastatin (ZOCOR) 40 MG tablet Take 1 tablet (40 mg total) by mouth at bedtime. Has tolerated even with amlodipine use.  He didn't tolerate atorvastatin. 03/19/21  Tonia Ghent, MD  ?umeclidinium bromide (INCRUSE ELLIPTA) 62.5 MCG/INH AEPB Inhale 1 puff into the lungs daily. 02/15/21   Brand Males, MD  ?vitamin B-12 (CYANOCOBALAMIN) 1000 MCG tablet Take 1,000 mcg by mouth in the morning.    [provider]  ?                                                                                                                                  ?Allergies ?Aldactone [spironolactone], Codeine, Atorvastatin, Januvia [sitagliptin], Jardiance [empagliflozin], and Lisinopril ? ?Review of Systems ?Review of Systems ?As noted in HPI ? ?Physical Exam ?Vital Signs  ?I have reviewed the triage vital signs ?BP (!) 143/72   Pulse 98   Temp 98 ?F (36.7 ?C) (Oral)   Resp 20   SpO2 93%  ? ?Physical Exam ?Vitals reviewed.  ?Constitutional:   ?   General: He is not in acute distress. ?   Appearance: He is well-developed. He is not diaphoretic.  ?HENT:  ?   Head: Normocephalic and atraumatic.  ?   Nose: Nose normal.  ?Eyes:  ?   General: No scleral icterus.    ?   Right eye: No discharge.     ?    Left eye: No discharge.  ?   Conjunctiva/sclera: Conjunctivae normal.  ?   Pupils: Pupils are equal, round, and reactive to light.  ?Cardiovascular:  ?   Rate and Rhythm: Normal rate and regular rhythm.

## 2021-08-22 DIAGNOSIS — K529 Noninfective gastroenteritis and colitis, unspecified: Secondary | ICD-10-CM | POA: Diagnosis not present

## 2021-08-22 DIAGNOSIS — K5289 Other specified noninfective gastroenteritis and colitis: Secondary | ICD-10-CM | POA: Diagnosis not present

## 2021-08-22 LAB — CBC
HCT: 37.3 % — ABNORMAL LOW (ref 39.0–52.0)
HCT: 39.8 % (ref 39.0–52.0)
Hemoglobin: 12.7 g/dL — ABNORMAL LOW (ref 13.0–17.0)
Hemoglobin: 13.7 g/dL (ref 13.0–17.0)
MCH: 33.2 pg (ref 26.0–34.0)
MCH: 33.8 pg (ref 26.0–34.0)
MCHC: 34 g/dL (ref 30.0–36.0)
MCHC: 34.4 g/dL (ref 30.0–36.0)
MCV: 97.4 fL (ref 80.0–100.0)
MCV: 98.3 fL (ref 80.0–100.0)
Platelets: 172 10*3/uL (ref 150–400)
Platelets: 201 10*3/uL (ref 150–400)
RBC: 3.83 MIL/uL — ABNORMAL LOW (ref 4.22–5.81)
RBC: 4.05 MIL/uL — ABNORMAL LOW (ref 4.22–5.81)
RDW: 13.1 % (ref 11.5–15.5)
RDW: 13 % (ref 11.5–15.5)
WBC: 10.1 10*3/uL (ref 4.0–10.5)
WBC: 11.3 10*3/uL — ABNORMAL HIGH (ref 4.0–10.5)
nRBC: 0 % (ref 0.0–0.2)
nRBC: 0 % (ref 0.0–0.2)

## 2021-08-22 LAB — BASIC METABOLIC PANEL
Anion gap: 5 (ref 5–15)
BUN: 19 mg/dL (ref 8–23)
CO2: 26 mmol/L (ref 22–32)
Calcium: 8.6 mg/dL — ABNORMAL LOW (ref 8.9–10.3)
Chloride: 105 mmol/L (ref 98–111)
Creatinine, Ser: 1.3 mg/dL — ABNORMAL HIGH (ref 0.61–1.24)
GFR, Estimated: 58 mL/min — ABNORMAL LOW (ref >60)
Glucose, Bld: 127 mg/dL — ABNORMAL HIGH (ref 70–99)
Potassium: 4.3 mmol/L (ref 3.5–5.1)
Sodium: 136 mmol/L (ref 135–145)

## 2021-08-22 LAB — GLUCOSE, CAPILLARY
Glucose-Capillary: 127 mg/dL — ABNORMAL HIGH (ref 70–99)
Glucose-Capillary: 210 mg/dL — ABNORMAL HIGH (ref 70–99)

## 2021-08-22 LAB — LACTIC ACID, PLASMA: Lactic Acid, Venous: 1.3 mmol/L (ref 0.5–1.9)

## 2021-08-22 NOTE — Progress Notes (Signed)
?  Transition of Care (TOC) Screening Note ? ? ?Patient Details  ?Name: Luke [name] ?Date of Birth: 01-06-1946 ? ? ?Transition of Care (TOC) CM/SW Contact:    ?Tom-Johnson, Renea Ee, RN ?Phone Number: ?08/22/2021, 11:12 AM ? ?Patient is admitted for Enteritis. From home with wife. Has one supporting son. Retired from [employer].Independent with care and drive self prior to admission.  Has a cane at home.  ?PCP is Tonia Ghent, MD and uses [facility] on [address]. ?Wife to transport at discharge.  ?Transition of Care Department [facility]) has reviewed patient and no TOC needs or recommendations have been identified at this time. TOC will continue to monitor patient advancement through interdisciplinary progression rounds. If new patient transition needs arise, please place a TOC consult. ?  ?

## 2021-08-22 NOTE — TOC Transition Note (Signed)
Transition of Care (TOC) - CM/SW Discharge Note ? ? ?Patient Details  ?Name: Luke [name] ?MRN: [id number] ?Date of Birth: June 24, 1945 ? ?Transition of Care (TOC) CM/SW Contact:  ?Tom-Johnson, Renea Ee, RN ?Phone Number: ?08/22/2021, 4:16 PM ? ? ?Clinical Narrative:    ? ?Patient is scheduled for discharge today. No TOC needs or recommendations noted. Denies any needs. Wife to transport at discharge. No further TOC needs noted. ? ?  ?  ? ? ?Patient Goals and CMS Choice ?  ?  ?  ? ?Discharge Placement ?  ?           ?  ?  ?  ?  ? ?Discharge Plan and Services ?  ?  ?           ?  ?  ?  ?  ?  ?  ?  ?  ?  ?  ? ?Social Determinants of Health (SDOH) Interventions ?  ? ? ?Readmission Risk Interventions ?   ? View : No data to display.  ?  ?  ?  ? ? ? ? ? ?

## 2021-08-22 NOTE — Care Management Obs Status (Signed)
MEDICARE OBSERVATION STATUS NOTIFICATION ? ? ?Patient Details  ?Name: Luke [name] ?MRN: [id number] ?Date of Birth: 12/29/1945 ? ? ?Medicare Observation Status Notification Given:  Yes ? ? ? ?Tom-Johnson, Renea Ee, RN ?08/22/2021, 11:11 AM ?

## 2021-08-22 NOTE — Plan of Care (Signed)

## 2021-08-22 NOTE — Progress Notes (Signed)
[facility] ?Progress Note ? ?   ?Subjective: ?CC-  ?Feeling much better today. States that he has no abdominal pain this morning. Denies n/v. Passing some flatus and had a small nonbloody and formed BM over night.  ?WBC and lactic acid normalized. ? ?Objective: ?Vital signs in last 24 hours: ?Temp:  [98.3 ?F (36.8 ?C)-99.2 ?F (37.3 ?C)] 98.8 ?F (37.1 ?C) (04/19 5621) ?Pulse Rate:  [91-109] 93 (04/19 0826) ?Resp:  [15-22] 15 (04/19 0826) ?BP: (108-151)/(45-82) 108/45 (04/19 0826) ?SpO2:  [87 %-96 %] 91 % (04/19 0826) ?Last BM Date : 08/20/21 ? ?Intake/Output from previous day: ?04/18 0701 - 04/19 0700 ?In: 2806.2 [P.O.:60; I.V.:745.3; IV Piggyback:2000.9] ?Out: 150 [Urine:150] ?Intake/Output this shift: ?No intake/output data recorded. ? ?PE: ?Gen:  Alert, NAD, pleasant ?Abd: protuberant, soft, nontender ? ?Lab Results:  ?Recent Labs  ?  08/20/21 ?2044 08/22/21 ?0356  ?WBC 11.6* 10.1  ?HGB 16.9 12.7*  ?HCT 48.7 37.3*  ?PLT 276 172  ? ?BMET ?Recent Labs  ?  08/20/21 ?2044 08/22/21 ?0356  ?NA 136 136  ?K 4.3 4.3  ?CL 102 105  ?CO2 24 26  ?GLUCOSE 239* 127*  ?BUN 24* 19  ?CREATININE 1.30* 1.30*  ?CALCIUM 9.6 8.6*  ? ?PT/INR ?No results for input(s): LABPROT, INR in the last 72 hours. ?CMP  ?   ?Component Value Date/Time  ? NA 136 08/22/2021 0356  ? NA 135 07/11/2021 1115  ? K 4.3 08/22/2021 0356  ? CL 105 08/22/2021 0356  ? CO2 26 08/22/2021 0356  ? GLUCOSE 127 (H) 08/22/2021 0356  ? BUN 19 08/22/2021 0356  ? BUN 19 07/11/2021 1115  ? CREATININE 1.30 (H) 08/22/2021 0356  ? CREATININE 1.05 04/13/2021 1628  ? CALCIUM 8.6 (L) 08/22/2021 0356  ? PROT 7.6 08/20/2021 2044  ? ALBUMIN 4.2 08/20/2021 2044  ? AST 20 08/20/2021 2044  ? ALT 17 08/20/2021 2044  ? ALKPHOS 62 08/20/2021 2044  ? BILITOT 1.0 08/20/2021 2044  ? GFRNONAA 58 (L) 08/22/2021 0356  ? GFRAA >60 02/12/2019 0956  ? ?Lipase  ?   ?Component Value Date/Time  ? LIPASE 39 08/20/2021 2044  ? ? ? ? ? ?Studies/Results: ?CT ABDOMEN PELVIS W  CONTRAST ? ?Result Date: 08/21/2021 ?CLINICAL DATA:  Abdominal pain with nausea.  Appendicitis suspected. EXAM: CT ABDOMEN AND PELVIS WITH CONTRAST TECHNIQUE: Multidetector CT imaging of the abdomen and pelvis was performed using the standard protocol following bolus administration of intravenous contrast. RADIATION DOSE REDUCTION: This exam was performed according to the departmental dose-optimization program which includes automated exposure control, adjustment of the mA and/or kV according to patient size and/or use of iterative reconstruction technique. CONTRAST:  71m OMNIPAQUE IOHEXOL 300 MG/ML  SOLN COMPARISON:  CT abdomen 03/25/2018. FINDINGS: Lower chest: Changes of fibrotic lung disease evident. Hepatobiliary: No suspicious focal abnormality within the liver parenchyma. Small area of low attenuation in the anterior liver, adjacent to the falciform ligament, is in a characteristic location for focal fatty deposition. There is no evidence for gallstones, gallbladder wall thickening, or pericholecystic fluid. No intrahepatic or extrahepatic biliary dilation. Pancreas: No focal mass lesion. No dilatation of the main duct. No intraparenchymal cyst. No peripancreatic edema. Spleen: No splenomegaly. No focal mass lesion. Adrenals/Urinary Tract: No adrenal nodule or mass. Left kidney unremarkable. Central sinus cysts noted in the right kidney. No evidence for hydroureter. The urinary bladder appears normal for the degree of distention. Stomach/Bowel: Stomach is unremarkable. No gastric wall thickening. No evidence of outlet obstruction. Duodenum is  normally positioned as is the ligament of Treitz. Duodenal diverticulum noted. A segment of mid small bowel, likely distal jejunum shows wall thickening with prominent congestion/edema within the subtending mesentery (see image 58/series 3. Arterial and venous anatomy to this segment appears opacified in there is no definite pneumatosis at this time. No discrete  transition zone although small bowel distal to this in the right lower quadrant is completely decompressed. Small bowel proximal to the abnormal segment measures 3 cm diameter. The terminal ileum is normal. The appendix is normal. No gross colonic mass. No colonic wall thickening. Vascular/Lymphatic: There is moderate atherosclerotic calcification of the abdominal aorta without aneurysm. Status post aortic endograft. There is no gastrohepatic or hepatoduodenal ligament lymphadenopathy. No retroperitoneal or mesenteric lymphadenopathy. No pelvic sidewall lymphadenopathy. Reproductive: The prostate gland and seminal vesicles are unremarkable. Other: Small volume free fluid is seen adjacent to the liver with trace free fluid in the pelvis. Musculoskeletal: Small bilateral groin hernias contain fat with trace fluid also present in the right groin hernia. No worrisome lytic or sclerotic osseous abnormality. IMPRESSION: 1. A segment of mid small bowel, likely distal jejunum shows wall thickening with prominent congestion/edema within the subtending mesentery. No discrete transition zone although small bowel distal to this is decompressed. Imaging may be related to an infectious/inflammatory enteritis. Ischemic enteritis not excluded. No substantial bowel dilatation or evidence of a transition zone to suggest mechanical small-bowel obstruction. 2. Small volume free fluid in the abdomen and pelvis. 3. Changes of fibrotic lung disease in the visualized lung bases. 4. Aortic Atherosclerosis (ICD10-I70.0). Electronically Signed   By: Misty Stanley M.D.   On: 08/21/2021 06:28  ? ?DG Abdomen Acute W/Chest ? ?Result Date: 08/20/2021 ?CLINICAL DATA:  Abdominal and chest pain EXAM: DG ABDOMEN ACUTE WITH 1 VIEW CHEST COMPARISON:  10/03/2020, 06/28/2020 FINDINGS: Supine and upright frontal views of the abdomen and pelvis as well as an upright frontal view of the chest are obtained. Stable multi lead pacer. Cardiac silhouette is  unremarkable. Stable basilar predominant scarring and fibrosis, without significant change. No acute airspace disease, effusion, or pneumothorax. No bowel obstruction or ileus. No masses or abnormal calcifications. No free gas in the greater peritoneal sac. Postsurgical changes from prior aortic stent graft. No acute bony abnormalities. IMPRESSION: 1. Stable bibasilar scarring and fibrosis, compatible with UIP. 2. No acute intrathoracic process. 3. Unremarkable bowel gas pattern. Electronically Signed   By: Randa Ngo M.D.   On: 08/20/2021 21:08  ? ?CT Angio Abd/Pel W and/or Wo Contrast ? ?Result Date: 08/21/2021 ?CLINICAL DATA:  Acute abdominal pain, elevated lactate, rule out mesenteric ischemia EXAM: CTA ABDOMEN AND PELVIS WITHOUT AND WITH CONTRAST TECHNIQUE: Multidetector CT imaging of the abdomen and pelvis was performed using the standard protocol during bolus administration of intravenous contrast. Multiplanar reconstructed images and MIPs were obtained and reviewed to evaluate the vascular anatomy. RADIATION DOSE REDUCTION: This exam was performed according to the departmental dose-optimization program which includes automated exposure control, adjustment of the mA and/or kV according to patient size and/or use of iterative reconstruction technique. CONTRAST:  54m OMNIPAQUE IOHEXOL 350 MG/ML SOLN COMPARISON:  Earlier scan from the same day FINDINGS: VASCULAR Aorta: Moderate calcified plaque. No dissection or stenosis. Patent infrarenal bifurcated stent graft. Native sac diameter 3.3 cm. Pre-contrast scans were not obtained, limiting sensitivity for endoleak. Celiac: Patent without evidence of aneurysm, dissection, vasculitis or significant stenosis. SMA: Patent without evidence of aneurysm, dissection, vasculitis or significant stenosis. Renals: Bilateral partially calcified ostial renal artery plaque  resulting in at least mild stenosis, right worse than left. Vessels are patent distally. IMA: Proximal  occlusion, reconstituted distally by visceral collaterals. Inflow: Limbs of the stent graft extend into the distal common iliac arteries, well apposed. Native external internal iliac arteries are mildly atheromatous, patent without

## 2021-08-23 NOTE — Discharge Summary (Signed)
Physician Discharge Summary  ?LUKUS BINION [name]:[id number] DOB: 11-18-1945 DOA: 08/20/2021 ? ?PCP: Tonia Ghent, MD ? ?Admit date: 08/20/2021 ?Discharge date: 08/23/2021 ? ?Admitted From: Home ?Disposition: Home ? ?Recommendations for Outpatient Follow-up:  ?Follow up with PCP in 1-2 weeks ?Please obtain BMP/CBC in one week ?Please follow up on the following pending results: ? ?Home Health: None ?Equipment/Devices: None  ?Discharge Condition: Stable ?CODE STATUS: Full code ?Diet recommendation: Cardiac ?Brief/Interim Summary:Luke [name] is a 76 y.o. male with medical history significant of T2DM, HLD, HTN, CAD, systolic CHF, ICD secondary to CHB in setting of ischemic cardiomyopathy, pulmonary fibrosis, AAA s/p endovascular repair, PAD who presents to ED with complaints of nausea with stomach pain that radiated up into his chest. He states symptoms started yesterday. Stomach pain was generalized, but worse in RLQ. Would come in waves with pain rated as an 8/10 at its worse and described as dull. Pain radiated up into chest with pressure like sensation. He would burp and pain would go away. He states he never had chest pain alone, no palpitations, no pain when on the treadmill. He denies any vomiting or diarrhea. He has had normal BM with regular stool. Denies any blood. He had poor PO intake yesterday and since being in hospital since last night has not eaten much. Denies any fever/chills. Overall he he has been feeling well until yesterday.  ?History of umbilical hernia repair, no other surgeries   ?Last colonoscopy 04/21/2019. Polyps removed +tubular adenoma.  ?ER Course:  vitals: temp: 99.1, bp: 164/86, HR: 81, RR: 18, oxygen: 92%>88% on RA then placed on 4L [place].>100%on RA  ?Pertinent labs: wbc: 11.6, BUN: 24, creatinine: 1.30, troponin: 14>21>21, lactic acid: 2.6>2.8 ?CT abdomen pelvis: segment of small bowel shows thickening with prominent congestion/edema within the subtending mesentery. No discrete  transition zone although small bowel distal to this is decompressed. May be related to an infectious inflammatory enteritis.  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 ?Widely patent SMA and celiac axis; proximal occlusive mesenteric ?ischemia is unlikely. ?3. Proximal and mid small bowel distension with wall thickening and ?enhancement consistent with enteritis as before. ?In ED: given 2L bolus total.  ?  ? ?Discharge Diagnoses:  ?Principal Problem: ?  Enteritis ?Active Problems: ?  Acute renal failure superimposed on stage 3a chronic kidney disease ([place]) ?  CAD with complaints of chest pain ?  Type 2 diabetes mellitus with vascular disease ([place]) ?  Chronic systolic heart failure ([place]) ?  Essential hypertension, benign ?  IPF (idiopathic pulmonary fibrosis) ([place]) ?  HLD (hyperlipidemia) ?  Status post abdominal aortic aneurysm repair ? ? ?#1 enteritis patient admitted with nausea abdominal pain.  He was kept n.p.o. was given IV fluids with improvement.  He had no vomiting or diarrhea or blood in stool.  He tolerated full liquid diet on discharge.  He was asked to stay on full liquid/soft diet for few days before advancing his diet to regular diet as an outpatient.  He was anxious to go home and he was discharged home in a stable condition. ?Patient was followed by general surgery during the hospital stay ? ?#2 AKI on CKD stage IIIa improved with IV fluids. ? ?#3 CAD stable patient had atypical chest pain. ? ?#4 type 2 diabetes continue home insulin. ? ?#5 chronic systolic heart failure stable EF 40 to 45% echo done in March 2023. ? ?#6 essential hypertension continue home meds ?I have not restarted all his medications  on discharge due to soft blood pressure. ? ?#7 idiopathic pulmonary fibrosis follow-up with pulmonary as outpatient ? ? ?#8 hyperlipidemia on Zocor ? ?#9 abdominal aortic aneurysm repair follows with vascular surgery as outpatient. ? ?Estimated body mass index is  24.23 kg/m? as calculated from the following: ?  Height as of 08/08/21: '6\' 2"'$  (1.88 m). ?  Weight as of 08/08/21: 85.6 kg. ? ?Discharge Instructions ? ?Discharge Instructions   ? ? Diet - low sodium heart healthy   Complete by: As directed ?  ? Increase activity slowly   Complete by: As directed ?  ? ?  ? ?Allergies as of 08/22/2021   ? ?   Reactions  ? Aldactone [spironolactone] Other (See Comments)  ? Hyperkalemia  ? Codeine Rash, Hives  ? Atorvastatin   ? Myalgias with lipitor.  Does tolerate simvastatin.    ? Januvia [sitagliptin] Other (See Comments)  ? Diarrhea and heart racing  ? Jardiance [empagliflozin] Other (See Comments)  ? Polyuria; excessive weight loss  ? Lisinopril   ? Possible cause of pancreatitis  ? ?  ? ?  ?Medication List  ?  ? ?STOP taking these medications   ? ?hydrALAZINE 25 MG tablet ?Commonly known as: APRESOLINE ?  ?Pirfenidone 267 MG Tabs ?Commonly known as: [place] ?  ?sildenafil 20 MG tablet ?Commonly known as: REVATIO ?  ? ?  ? ?TAKE these medications   ? ?Accu-Chek Aviva Plus test strip ?Generic drug: glucose blood ?USE TO CHECK BLOOD GLUCOSE UP TO TWICE DAILY. ?  ?albuterol 108 (90 Base) MCG/ACT inhaler ?Commonly known as: VENTOLIN HFA ?Inhale 2 puffs into the lungs every 6 (six) hours as needed for wheezing or shortness of breath. ?  ?amLODipine 10 MG tablet ?Commonly known as: NORVASC ?TAKE 1 TABLET(10 MG) BY MOUTH DAILY ?What changed: See the new instructions. ?  ?aspirin EC 81 MG tablet ?Take 81 mg by mouth every evening. ?  ?carvedilol 25 MG tablet ?Commonly known as: COREG ?TAKE 1 TABLET BY MOUTH TWICE DAILY WITH MEALS ?  ?glipiZIDE 5 MG tablet ?Commonly known as: GLUCOTROL ?TAKE 1 TABLET(5 MG) BY MOUTH TWICE DAILY BEFORE A MEAL ?What changed: See the new instructions. ?  ?Incruse Ellipta 62.5 MCG/ACT Aepb ?Generic drug: umeclidinium bromide ?Inhale 1 puff into the lungs daily. ?  ?isosorbide mononitrate 30 MG 24 hr tablet ?Commonly known as: IMDUR ?Take 1 tablet (30 mg total) by  mouth daily. ?What changed: how much to take ?  ?Lantus SoloStar 100 UNIT/ML Solostar Pen ?Generic drug: insulin glargine ?INJECT 0.3 TO 0.35 MLS(30 TO 35 UNITS) INTO THE SKIN EVERY DAY ?What changed:  ?how much to take ?how to take this ?when to take this ?additional instructions ?  ?metFORMIN 500 MG tablet ?Commonly known as: GLUCOPHAGE ?TAKE 2 TABLETS BY MOUTH TWICE DAILY WITH FOOD ?What changed: See the new instructions. ?  ?multivitamin with minerals Tabs tablet ?Take 1 tablet by mouth every evening. ?  ?omeprazole 40 MG capsule ?Commonly known as: PRILOSEC ?TAKE 1 CAPSULE(40 MG) BY MOUTH TWICE DAILY ?What changed: See the new instructions. ?  ?simvastatin 40 MG tablet ?Commonly known as: ZOCOR ?Take 1 tablet (40 mg total) by mouth at bedtime. Has tolerated even with amlodipine use.  He didn't tolerate atorvastatin. ?  ?vitamin B-12 1000 MCG tablet ?Commonly known as: CYANOCOBALAMIN ?Take 1,000 mcg by mouth in the morning. ?  ?Vitamin D3 10 MCG (400 UNIT) Caps ?Take 1 capsule by mouth daily. ?What changed: Another medication with the same  name was removed. Continue taking this medication, and follow the directions you see here. ?  ? ?  ? ? Follow-up Information   ? ? Tonia Ghent, MD Follow up.   ?Specialty: Family Medicine ?Contact information: ?[address] ?[place] [address] ?[phone] ? ? ?  ?  ? ?  ?  ? ?  ? ?Allergies  ?Allergen Reactions  ? Aldactone [Spironolactone] Other (See Comments)  ?  Hyperkalemia  ? Codeine Rash and Hives  ? Atorvastatin   ?  Myalgias with lipitor.  Does tolerate simvastatin.    ? Januvia [Sitagliptin] Other (See Comments)  ?  Diarrhea and heart racing  ? Jardiance [Empagliflozin] Other (See Comments)  ?  Polyuria; excessive weight loss  ? Lisinopril   ?  Possible cause of pancreatitis  ? ? ?Consultations: ?General surgery ? ? ?Procedures/Studies: ?CT ABDOMEN PELVIS W CONTRAST ? ?Result Date: 08/21/2021 ?CLINICAL DATA:  Abdominal pain with nausea.  Appendicitis  suspected. EXAM: CT ABDOMEN AND PELVIS WITH CONTRAST TECHNIQUE: Multidetector CT imaging of the abdomen and pelvis was performed using the standard protocol following bolus administration of intravenous contrast

## 2021-08-26 LAB — CULTURE, BLOOD (ROUTINE X 2)
Culture: NO GROWTH
Culture: NO GROWTH
Special Requests: ADEQUATE

## 2021-08-27 ENCOUNTER — Other Ambulatory Visit: Payer: Self-pay

## 2021-08-27 ENCOUNTER — Encounter: Payer: Self-pay | Admitting: Cardiology

## 2021-08-27 MED ORDER — ISOSORBIDE MONONITRATE ER 30 MG PO TB24: 60.0000 mg | ORAL_TABLET | Freq: Every day | ORAL | 3 refills | Status: DC

## 2021-09-07 ENCOUNTER — Telehealth: Payer: Self-pay

## 2021-09-07 NOTE — Telephone Encounter (Signed)
Pt notified of Lantus supply that has arrived her at the office. Pt states that he will pick it up early next week.  ?

## 2021-09-08 NOTE — Progress Notes (Signed)
?Cardiology Office Note:   ? ?Date:  09/11/2021  ? ?ID:  Luke [name], DOB August 21, 1945, MRN [id number] ? ?PCP:  Tonia Ghent, MD ?  ?[facility] ?Cardiologist:  Candee Furbish, MD    ? ?Referring MD: Tonia Ghent, MD  ? ?Chief Complaint: follow-up CAD with stable angina ? ?History of Present Illness:   ? ?Luke [name] is a very pleasant  76 y.o. male with a hx of CAD, diabetes, chronic HFpEF, LBBB, complete heart block, ICM s/p Bi V ICD implant, PVD, hypertension, and hyperlipidemia.  ? ?He established care with Dr. [name] in 2013 when he was found to have severe LV dysfunction and screening for AAA repair.  Cardiac catheterization revealed significant two-vessel disease but PCI was not needed.  He was cleared for stenting of his AAA.  He was found to be in complete heart block upon feeling poor and noting heart rates in the 30s.  He was seen by Dr. Caryl Comes and EKG revealed CHB with right bundle branch block escape.  Previous EKG revealed sinus rhythm with LBBB. He underwent ICD/CRT with recent replacement June 2022 by Dr. Caryl Comes.  ? ?He was last seen in our office 07/11/2021 by Dr. Marlou Porch for evaluation of chest pain over the past 3 weeks lasting about 20 minutes after stress such as walking on the treadmill.  He had recently been diagnosed with idiopathic pulmonary fibrosis ?LHC 07/17/21 revealed two-vessel CAD with 80% focal proximal LAD stenosis which is unchanged from 2013 except with the development of significant calcification diffusely calcified RCA subtotal/total occlusion distantly but with excellent left-to-right collateralization to the PDA and PLA system.  Plan to increase medical regimen with further titration of isosorbide and possible initiation of Ranexa.  If symptoms worsen consideration can be given to orbital atherectomy or surgical revascularization. ? ?Today, he is here alone for follow-up. Reports he is feeling well. Returned to ED since cardiac cath for abdominal/chest pain that  felt different than prior to cath. Had abdominal CT and pain was felt to be 2/2 viral GI illness.  He is walking 30 min daily on treadmill at 4 mph and doing yard work on a consistent basis without chest discomfort. Feels some occasional shortness of breath with prolonged activity, feels that this is stable. Has not been able to tolerate additional 30 mg of Imdur in the morning due to lightheadedness, is currently taking it in the evening. Tries to eat a mostly plant-based diet, however he continues to eat a significant amount of dairy. He denies chest pain, lower extremity edema, fatigue, palpitations, melena, hematuria, hemoptysis, diaphoresis, weakness, presyncope, syncope, orthopnea, and PND. ? ?Past Medical History:  ?Diagnosis Date  ? AAA (abdominal aortic aneurysm) ([place]) 2011  ? Per vascular surgery  ? Anxiety   ? no med. in use for anxiety but pt. speaks openly of his stress & anxious feelings regarding impending surgery    ? Atrial fibrillation ([place])   ? Automatic implantable cardioverter-defibrillator in situ   ? CAD (coronary artery disease)   ? Presumed CAD with nuclear scan October 09, 2011,  large anteroseptal MI and inferior MI. Catheterization scheduled October 15, 2011  ? Cardiomyopathy ([place])   ? Nuclear, October 09, 2011, EF 30%, multiple focal wall motion abnormalities  ? Diabetes mellitus   ? type II  ? Drug therapy   ? Hyperkalemia with spironolactone  ? Ejection fraction < 50%   ? EF 30%, nuclear, October 09, 2011  ? Ejection fraction <  50%   ? EF previously 30%  //   EF 30-35%, echo, July 14, 2012, severe diffuse hypokinesis, PA pressure 43 mm mercury  ? Fall due to ice or snow Feb.  19, 2015  ? HLD (hyperlipidemia)   ? Hypertension   ? white coat HTN-- often elevated in office and controlled on outside checks.  ? ICD (implantable cardioverter-defibrillator) in place   ? CRT-D placed March, 2014 complete heart block and k dysfunction  ? LBBB (left bundle branch block)   ? LBBB on EKG October 11, 2011,  no prior  EKG has been done  ? Low testosterone   ? Hx of  ? Myocardial infarction [facility])   ? Told in 2015-never knew-showed up on stress test for AAA;   ? Pacemaker   ? PAD (peripheral artery disease) ([place])   ? Pancreatitis   ? ? ?Past Surgical History:  ?Procedure Laterality Date  ? ABDOMINAL AORTAGRAM N/A 10/28/2011  ? Procedure: ABDOMINAL AORTAGRAM;  Surgeon: Angelia Mould, MD;  Location[facility] C[facility] LAB;  Service: Cardiovascular;  Laterality: N/A;  ? ABDOMINAL AORTIC ANEURYSM REPAIR    ? EVAR   ? BI-VENTRICULAR IMPLANTABLE CARDIOVERTER DEFIBRILLATOR N/A 07/16/2012  ? Procedure: BI-VENTRICULAR IMPLANTABLE CARDIOVERTER DEFIBRILLATOR  (CRT-D);  Surgeon: Deboraha Sprang, MD;  Location: [facility] CATH LAB;  Service: Cardiovascular;  Laterality: N/A;  ? BIV ICD GENERATOR CHANGEOUT N/A 10/25/2020  ? Procedure: BIV ICD GENERATOR CHANGEOUT;  Surgeon: Deboraha Sprang, MD;  Location: [facility] CV LAB;  Service: Cardiovascular;  Laterality: N/A;  ? CARDIAC CATHETERIZATION    ? COLONOSCOPY  03/23/2018; 2020  ? ESOPHAGOGASTRODUODENOSCOPY N/A 02/12/2019  ? Procedure: ESOPHAGOGASTRODUODENOSCOPY (EGD);  Surgeon: Jackquline Denmark, MD;  Location: [facility] ENDOSCOPY;  Service: Endoscopy;  Laterality: N/A;  ? ESOPHAGOGASTRODUODENOSCOPY (EGD) WITH PROPOFOL N/A 02/05/2018  ? Procedure: ESOPHAGOGASTRODUODENOSCOPY (EGD) WITH PROPOFOL;  Surgeon: Mauri Pole, MD;  Location: [facility] ENDOSCOPY;  Service: Endoscopy;  Laterality: N/A;  ? ESOPHAGOGASTRODUODENOSCOPY (EGD) WITH PROPOFOL N/A 09/27/2018  ? Procedure: ESOPHAGOGASTRODUODENOSCOPY (EGD) WITH PROPOFOL;  Surgeon: Carol Ada, MD;  Location: [facility];  Service: Endoscopy;  Laterality: N/A;  ? FOREIGN BODY REMOVAL  09/27/2018  ? Procedure: FOREIGN BODY REMOVAL;  Surgeon: Carol Ada, MD;  Location: [place];  Service: Endoscopy;;  ? FOREIGN BODY REMOVAL  02/12/2019  ? Procedure: FOREIGN BODY REMOVAL;  Surgeon: Jackquline Denmark, MD;  Location: [facility] ENDOSCOPY;  Service: Endoscopy;;  ? HERNIA REPAIR  Jan. 9,  2015  ? INSERTION OF MESH N/A 05/14/2013  ? Procedure: INSERTION OF MESH;  Surgeon: Adin Hector, MD;  Location: [place];  Service: General;  Laterality: N/A;  ? LEFT HEART CATH AND CORONARY ANGIOGRAPHY N/A 07/17/2021  ? Procedure: LEFT HEART CATH AND CORONARY ANGIOGRAPHY;  Surgeon: Troy Sine, MD;  Location: [place] CV LAB;  Service: Cardiovascular;  Laterality: N/A;  ? LOWER EXTREMITY ANGIOGRAM Bilateral 10/28/2011  ? Procedure: LOWER EXTREMITY ANGIOGRAM;  Surgeon: Angelia Mould, MD;  Location: [facility] CATH LAB;  Service: Cardiovascular;  Laterality: Bilateral;  ? PACEMAKER INSERTION  07-16-12  ? pacemaker/defibrilator  ? POLYPECTOMY    ? POSTERIOR CERVICAL FUSION/FORAMINOTOMY N/A 06/09/2014  ? Procedure: Laminectomy - Cervical two-Cervcial four posterior cervical instrumented fusion Cervical two-cervical four;  Surgeon: Eustace Moore, MD;  Location: [facility] NEURO ORS;  Service: Neurosurgery;  Laterality: N/A;  posterior ?  ? SAVORY DILATION N/A 02/05/2018  ? Procedure: SAVORY DILATION;  Surgeon: Mauri Pole, MD;  Location: [facility] ENDOSCOPY;  Service: Endoscopy;  Laterality:  N/A;  ? TONSILLECTOMY    ? as a child   ? UMBILICAL HERNIA REPAIR N/A 05/14/2013  ? Procedure: LAPAROSCOPIC exploration and repair of hernia in abdominal ;  Surgeon: Adin Hector, MD;  Location: [place];  Service: General;  Laterality: N/A;  ? UPPER GASTROINTESTINAL ENDOSCOPY  2020  ? ? ?Current Medications: ?Current Meds  ?Medication Sig  ? albuterol (VENTOLIN HFA) 108 (90 Base) MCG/ACT inhaler Inhale 2 puffs into the lungs every 6 (six) hours as needed for wheezing or shortness of breath.  ? amLODipine (NORVASC) 10 MG tablet TAKE 1 TABLET(10 MG) BY MOUTH DAILY  ? aspirin EC 81 MG tablet Take 81 mg by mouth every evening.  ? carvedilol (COREG) 25 MG tablet TAKE 1 TABLET BY MOUTH TWICE DAILY WITH MEALS  ? Cholecalciferol (VITAMIN D3) 10 MCG (400 UNIT) CAPS Take 1 capsule by mouth daily.  ? glipiZIDE (GLUCOTROL) 5 MG tablet TAKE 1 TABLET(5  MG) BY MOUTH TWICE DAILY BEFORE A MEAL  ? glucose blood (ACCU-CHEK AVIVA PLUS) test strip USE TO CHECK BLOOD GLUCOSE UP TO TWICE DAILY.  ? insulin glargine (LANTUS SOLOSTAR) 100 UNIT/ML Solostar Pen IN

## 2021-09-11 ENCOUNTER — Ambulatory Visit (INDEPENDENT_AMBULATORY_CARE_PROVIDER_SITE_OTHER): Payer: Medicare Other | Admitting: Nurse Practitioner

## 2021-09-11 ENCOUNTER — Encounter: Payer: Self-pay | Admitting: Nurse Practitioner

## 2021-09-11 VITALS — BP 140/68 | HR 66 | Ht 74.0 in | Wt 182.8 lb

## 2021-09-11 DIAGNOSIS — I10 Essential (primary) hypertension: Secondary | ICD-10-CM | POA: Diagnosis not present

## 2021-09-11 DIAGNOSIS — N1831 Chronic kidney disease, stage 3a: Secondary | ICD-10-CM | POA: Diagnosis not present

## 2021-09-11 DIAGNOSIS — I442 Atrioventricular block, complete: Secondary | ICD-10-CM | POA: Diagnosis not present

## 2021-09-11 DIAGNOSIS — I5042 Chronic combined systolic (congestive) and diastolic (congestive) heart failure: Secondary | ICD-10-CM | POA: Diagnosis not present

## 2021-09-11 DIAGNOSIS — I25118 Atherosclerotic heart disease of native coronary artery with other forms of angina pectoris: Secondary | ICD-10-CM | POA: Diagnosis not present

## 2021-09-11 DIAGNOSIS — J84112 Idiopathic pulmonary fibrosis: Secondary | ICD-10-CM

## 2021-09-11 DIAGNOSIS — E785 Hyperlipidemia, unspecified: Secondary | ICD-10-CM

## 2021-09-11 MED ORDER — VALSARTAN 40 MG PO TABS: 40.0000 mg | ORAL_TABLET | Freq: Every day | ORAL | 11 refills | Status: DC

## 2021-09-11 NOTE — Patient Instructions (Addendum)
Medication Instructions:  ? ?Your physician recommends that you continue on your current medications as directed. Please refer to the Current Medication list given to you today. ? ?** We recommend LDL < 70. Work on mostly plant based diet and have lipid panel rechecked at next office visit with PCP. If LDL remains elevated, would favor switching simvastatin to PCSK9-inhibitor. We are happy to help with this transition.  ? ?*If you need a refill on your cardiac medications before your next appointment, please call your pharmacy* ? ? ?Lab Work: ? ?None ordered. ? ?If you have labs (blood work) drawn today and your tests are completely normal, you will receive your results only by: ?MyChart Message (if you have MyChart) OR ?A paper copy in the mail ?If you have any lab test that is abnormal or we need to change your treatment, we will call you to review the results. ? ? ?Testing/Procedures: ? ? ?None ordered. ? ? ?Follow-Up: ?At [facility], you and your health needs are our priority.  As part of our continuing mission to provide you with exceptional heart care, we have created designated Provider Care Teams.  These Care Teams include your primary Cardiologist (physician) and Advanced Practice Providers (APPs -  Physician Assistants and Nurse Practitioners) who all work together to provide you with the care you need, when you need it. ? ?We recommend signing up for the patient portal called "MyChart".  Sign up information is provided on this After Visit Summary.  MyChart is used to connect with patients for Virtual Visits (Telemedicine).  Patients are able to view lab/test results, encounter notes, upcoming appointments, etc.  Non-urgent messages can be sent to your provider as well.   ?To learn more about what you can do with MyChart, go to [email or web address].   ? ?Your next appointment:   ?6 month(s) ? ?The format for your next appointment:   ?In Person ? ?Provider:   ?None   ? ?Mediterranean Diet ?A  Mediterranean diet refers to food and lifestyle choices that are based on the traditions of countries located on the [employer]. It focuses on eating more fruits, vegetables, whole grains, beans, nuts, seeds, and heart-healthy fats, and eating less dairy, meat, eggs, and processed foods with added sugar, salt, and fat. This way of eating has been shown to help prevent certain conditions and improve outcomes for people who have chronic diseases, like kidney disease and heart disease. ?What are tips for following this plan? ?Reading food labels ?Check the serving size of packaged foods. For foods such as rice and pasta, the serving size refers to the amount of cooked product, not dry. ?Check the total fat in packaged foods. Avoid foods that have saturated fat or trans fats. ?Check the ingredient list for added sugars, such as corn syrup. ?Shopping ? ?Buy a variety of foods that offer a balanced diet, including: ?Fresh fruits and vegetables (produce). ?Grains, beans, nuts, and seeds. Some of these may be available in unpackaged forms or large amounts (in bulk). ?Fresh seafood. ?Poultry and eggs. ?Low-fat dairy products. ?Buy whole ingredients instead of prepackaged foods. ?Buy fresh fruits and vegetables in-season from local farmers markets. ?Buy plain frozen fruits and vegetables. ?If you do not have access to quality fresh seafood, buy precooked frozen shrimp or canned fish, such as tuna, salmon, or sardines. ?Stock your pantry so you always have certain foods on hand, such as olive oil, canned tuna, canned tomatoes, rice, pasta, and beans. ?Cooking ?Cook foods with extra-virgin olive  oil instead of using butter or other vegetable oils. ?Have meat as a side dish, and have vegetables or grains as your main dish. This means having meat in small portions or adding small amounts of meat to foods like pasta or stew. ?Use beans or vegetables instead of meat in common dishes like chili or lasagna. ?Experiment with  different cooking methods. Try roasting, broiling, steaming, and saut?ing vegetables. ?Add frozen vegetables to soups, stews, pasta, or rice. ?Add nuts or seeds for added healthy fats and plant protein at each meal. You can add these to yogurt, salads, or vegetable dishes. ?Marinate fish or vegetables using olive oil, lemon juice, garlic, and fresh herbs. ?Meal planning ?Plan to eat one vegetarian meal one day each week. Try to work up to two vegetarian meals, if possible. ?Eat seafood two or more times a week. ?Have healthy snacks readily available, such as: ?Vegetable sticks with hummus. ?[place] yogurt. ?Fruit and nut trail mix. ?Eat balanced meals throughout the week. This includes: ?Fruit: 2-3 servings a day. ?Vegetables: 4-5 servings a day. ?Low-fat dairy: 2 servings a day. ?Fish, poultry, or lean meat: 1 serving a day. ?Beans and legumes: 2 or more servings a week. ?Nuts and seeds: 1-2 servings a day. ?Whole grains: 6-8 servings a day. ?Extra-virgin olive oil: 3-4 servings a day. ?Limit red meat and sweets to only a few servings a month. ?Lifestyle ? ?Cook and eat meals together with your family, when possible. ?Drink enough fluid to keep your urine pale yellow. ?Be physically active every day. This includes: ?Aerobic exercise like running or swimming. ?Leisure activities like gardening, walking, or housework. ?Get 7-8 hours of sleep each night. ?If recommended by your health care provider, drink red wine in moderation. This means 1 glass a day for nonpregnant women and 2 glasses a day for men. A glass of wine equals 5 oz (150 mL). ?What foods should I eat? ?Fruits ?Apples. Apricots. Avocado. Berries. Bananas. Cherries. Dates. Figs. Grapes. Lemons. Melon. Oranges. Peaches. Plums. Pomegranate. ?Vegetables ?Artichokes. Beets. Broccoli. Cabbage. Carrots. Eggplant. Green beans. Chard. Kale. Spinach. Onions. Leeks. Peas. Squash. Tomatoes. Peppers. Radishes. ?Grains ?Whole-grain pasta. Brown rice. Bulgur wheat.  Polenta. Couscous. Whole-wheat bread. [place]. ?Meats and other proteins ?Beans. Almonds. Sunflower seeds. Pine nuts. Peanuts. [place]. Salmon. Scallops. Shrimp. [place]. Tilapia. Clams. Oysters. Eggs. Poultry without skin. ?Dairy ?Low-fat milk. Cheese. Greek yogurt. ?Fats and oils ?Extra-virgin olive oil. Avocado oil. Grapeseed oil. ?Beverages ?Water. Red wine. Herbal tea. ?Sweets and desserts ?Greek yogurt with honey. Baked apples. Poached pears. Trail mix. ?Seasonings and condiments ?[place]. Cilantro. Coriander. Cumin. Mint. Parsley. Sage. Rosemary. Tarragon. Garlic. Oregano. Thyme. Pepper. Balsamic vinegar. Tahini. Hummus. Tomato sauce. Olives. Mushrooms. ?The items listed above may not be a complete list of foods and beverages you can eat. Contact a dietitian for more information. ?What foods should I limit? ?This is a list of foods that should be eaten rarely or only on special occasions. ?Fruits ?Fruit canned in syrup. ?Vegetables ?Deep-fried potatoes (french fries). ?Grains ?Prepackaged pasta or rice dishes. Prepackaged cereal with added sugar. Prepackaged snacks with added sugar. ?Meats and other proteins ?Beef. Pork. Lamb. Poultry with skin. Hot dogs. [place] Salines. ?Dairy ?Ice cream. Sour cream. Whole milk. ?Fats and oils ?Butter. [place]ola oil. Vegetable oil. Beef fat (tallow). Lard. ?Beverages ?Juice. Sugar-sweetened soft drinks. Beer. Liquor and spirits. ?Sweets and desserts ?Cookies. Cakes. Pies. Candy. ?Seasonings and condiments ?Mayonnaise. Pre-made sauces and marinades. ?The items listed above may not be a complete list of foods and beverages you  should limit. Contact a dietitian for more information. ?Summary ?The Mediterranean diet includes both food and lifestyle choices. ?Eat a variety of fresh fruits and vegetables, beans, nuts, seeds, and whole grains. ?Limit the amount of red meat and sweets that you eat. ?If recommended by your health care provider, drink red wine in moderation. This means 1 glass a  day for nonpregnant women and 2 glasses a day for men. A glass of wine equals 5 oz (150 mL). ?This information is not intended to replace advice given to you by your health care provider. Make sure you discuss any qu

## 2021-09-15 ENCOUNTER — Other Ambulatory Visit: Payer: Self-pay | Admitting: Gastroenterology

## 2021-09-22 ENCOUNTER — Other Ambulatory Visit: Payer: Self-pay | Admitting: Family Medicine

## 2021-10-02 ENCOUNTER — Ambulatory Visit (INDEPENDENT_AMBULATORY_CARE_PROVIDER_SITE_OTHER): Payer: Medicare Other | Admitting: Internal Medicine

## 2021-10-02 DIAGNOSIS — J84112 Idiopathic pulmonary fibrosis: Secondary | ICD-10-CM | POA: Diagnosis not present

## 2021-10-02 LAB — PULMONARY FUNCTION TEST
DL/VA % pred: 62 %
DL/VA: 2.45 ml/min/mmHg/L
DLCO cor % pred: 50 %
DLCO cor: 13.45 ml/min/mmHg
DLCO unc % pred: 48 %
DLCO unc: 13.1 ml/min/mmHg
FEF 25-75 Pre: 1.56 L/sec
FEF2575-%Pred-Pre: 63 %
FEV1-%Pred-Pre: 82 %
FEV1-Pre: 2.77 L
FEV1FVC-%Pred-Pre: 93 %
FEV6-%Pred-Pre: 91 %
FEV6-Pre: 4 L
FEV6FVC-%Pred-Pre: 103 %
FVC-%Pred-Pre: 88 %
FVC-Pre: 4.09 L
Pre FEV1/FVC ratio: 68 %
Pre FEV6/FVC Ratio: 98 %

## 2021-10-02 NOTE — Progress Notes (Signed)
Spirometry and DLCO Performed today 

## 2021-10-02 NOTE — Patient Instructions (Signed)
Spirometry and DLCO Performed today 

## 2021-10-03 ENCOUNTER — Encounter: Payer: Self-pay | Admitting: Internal Medicine

## 2021-10-03 ENCOUNTER — Ambulatory Visit (INDEPENDENT_AMBULATORY_CARE_PROVIDER_SITE_OTHER): Payer: Medicare Other | Admitting: Internal Medicine

## 2021-10-03 VITALS — BP 132/80 | HR 74 | Temp 97.9°F | Ht 74.0 in | Wt 184.0 lb

## 2021-10-03 DIAGNOSIS — J84112 Idiopathic pulmonary fibrosis: Secondary | ICD-10-CM

## 2021-10-03 DIAGNOSIS — I2510 Atherosclerotic heart disease of native coronary artery without angina pectoris: Secondary | ICD-10-CM

## 2021-10-03 DIAGNOSIS — Z5181 Encounter for therapeutic drug level monitoring: Secondary | ICD-10-CM

## 2021-10-03 DIAGNOSIS — R0602 Shortness of breath: Secondary | ICD-10-CM | POA: Diagnosis not present

## 2021-10-03 LAB — HEPATIC FUNCTION PANEL
ALT: 11 U/L (ref 0–53)
AST: 15 U/L (ref 0–37)
Albumin: 4.1 g/dL (ref 3.5–5.2)
Alkaline Phosphatase: 55 U/L (ref 39–117)
Bilirubin, Direct: 0.1 mg/dL (ref 0.0–0.3)
Total Bilirubin: 0.8 mg/dL (ref 0.2–1.2)
Total Protein: 6.9 g/dL (ref 6.0–8.3)

## 2021-10-03 LAB — CBC WITH DIFFERENTIAL/PLATELET
Basophils Absolute: 0.1 10*3/uL (ref 0.0–0.1)
Basophils Relative: 0.7 % (ref 0.0–3.0)
Eosinophils Absolute: 0.1 10*3/uL (ref 0.0–0.7)
Eosinophils Relative: 1.4 % (ref 0.0–5.0)
HCT: 39.3 % (ref 39.0–52.0)
Hemoglobin: 13.3 g/dL (ref 13.0–17.0)
Lymphocytes Relative: 16.3 % (ref 12.0–46.0)
Lymphs Abs: 1.6 10*3/uL (ref 0.7–4.0)
MCHC: 33.8 g/dL (ref 30.0–36.0)
MCV: 99 fl (ref 78.0–100.0)
Monocytes Absolute: 0.6 10*3/uL (ref 0.1–1.0)
Monocytes Relative: 6.3 % (ref 3.0–12.0)
Neutro Abs: 7.5 10*3/uL (ref 1.4–7.7)
Neutrophils Relative %: 75.3 % (ref 43.0–77.0)
Platelets: 250 10*3/uL (ref 150.0–400.0)
RBC: 3.97 Mil/uL — ABNORMAL LOW (ref 4.22–5.81)
RDW: 13.6 % (ref 11.5–15.5)
WBC: 9.9 10*3/uL (ref 4.0–10.5)

## 2021-10-03 LAB — BASIC METABOLIC PANEL
BUN: 27 mg/dL — ABNORMAL HIGH (ref 6–23)
CO2: 26 mEq/L (ref 19–32)
Calcium: 9.9 mg/dL (ref 8.4–10.5)
Chloride: 100 mEq/L (ref 96–112)
Creatinine, Ser: 1.18 mg/dL (ref 0.40–1.50)
GFR: 60.58 mL/min (ref >60.00)
Glucose, Bld: 257 mg/dL — ABNORMAL HIGH (ref 70–99)
Potassium: 5 mEq/L (ref 3.5–5.1)
Sodium: 133 mEq/L — ABNORMAL LOW (ref 135–145)

## 2021-10-03 LAB — BRAIN NATRIURETIC PEPTIDE: Pro B Natriuretic peptide (BNP): 69 pg/mL (ref 0.0–100.0)

## 2021-10-03 NOTE — Progress Notes (Signed)
OV 01/02/2021  Subjective:  Patient ID: Luke [name], male , DOB: [birth year] , age 76 y.o. , MRN: [id number] , ADDRESS: [place] [id number] PCP Tonia Ghent, MD Patient Care Team: Tonia Ghent, MD as PCP - General Angelia Mould, MD (Vascular Surgery) Arta Silence, MD (Gastroenterology) Michael Boston, MD (General Surgery) Eustace Moore, MD (Neurosurgery) Juluis Rainier (Optometry) Jerline Pain, MD (Cardiology) [name], [facility] as Pharmacist (Pharmacist)  This Provider for this visit: Treatment Team:  Attending Provider: Brand Males, MD    01/02/2021 -transfer of care to the interstitial lung disease center for Dr. Chase Caller.  Referral from Dr. June Leap Chief Complaint  Patient presents with   Consult    ILD consult per Dr. Valeta Harms. Pt states he has been doing okay since last visit and denies any complaints.     [name] 76 y.o. -referred by Dr. June Leap for interstitial lung disease evaluation    Integrated Comprehensive ILD Questionnaire  Symptoms:  -Reports insidious onset of shortness of breath gradually.  It is the same since it started.  He is unclear how long he is headed but he had it for a few years.  There is no cough at all.  There is no clearing of the throat.  There is no fatigue.  His appetite is good no nausea no vomiting no diarrhea no anxiety no depression no chronic pain    Past Medical History :   In 2009 he was diagnosed with systolic heart failure.  He believes etiology is ischemic based on a remote heart attack.  Has a history of diabetes for several years.  Has kidney disease not otherwise specified for several years.  His GFR in May 2022 was greater than 60 with a creatinine 1.23 mg percent.   His last echocardiogram was in 2019.  He sees cardiology Dr. Caryl Comes and Dr. Candee Furbish.  Overall he is deemed to be stable.  He had pacemaker check recently.  Denies any  collagen vascular disease or vasculitis sleep apnea.  Denies any PE.  He has had COVID-vaccine but has not had COVID. ROS:   -Positive for fatigue.  Has some dysphagia for the last few days but otherwise okay.  No nausea no vomiting no heartburn no snoring no rash no ulcers.   FAMILY HISTORY of LUNG DISEASE: Denies   EXPOSURE HISTORY: Smokes cigarettes 20 1966 in 2012 30 cigarettes/day.  He did have some passive smoking.  Did smoke marijuana between 1968 and 1970.  Once a month.  No cocaine use no intravenous drug use   HOME and HOBBY DETAILS : Single-family [place] for the last 25 years the age of the home is 22 years.  There is no dampness.  No mildew no mold.  No humidifier use no CPAP use no nebulizer use.  No steam iron use no Jacuzzi use.  No misting [place] no pet birds or parakeets no pet gerbils.  No mold in the [facility] duct.  Last checked in 2009.  Does do gardening.  No birds at this no strong mats no water damage no Jacuzzi.   OCCUPATIONAL HISTORY (122 questions) : He was in [place] and got exposed to agent orange but otherwise detailed questioning is negative other than the fact he worked with some petroleum based cleaning agents.   PULMONARY TOXICITY HISTORY (27 items): He was on hydralazine in 2019.   HRCT May 2022 -  personally visualzied -definite progression is combined emphysema with UIP features.  IMPRESSION: 1. Spectrum of findings compatible with basilar predominant fibrotic interstitial lung disease without frank honeycombing, with mild progression at the lung bases since 03/25/2018 CT abdomen study, with more clear progression since 10/07/2011 CT abdomen study. Findings are categorized as probable UIP per consensus guidelines: Diagnosis of Idiopathic Pulmonary Fibrosis: An Official ATS/ERS/JRS/ALAT Clinical Practice Guideline. [place], Iss 5, [id number], Jan 04 2017. 2. Scattered small solid pulmonary nodules, largest 5 mm.  Follow-up noncontrast chest CT recommended in 12 months. This recommendation follows the consensus statement: Guidelines for Management of Incidental Pulmonary Nodules Detected on CT Images: From the Fleischner Society 2017; Radiology 2017; 284:228-243. 3. Three-vessel coronary atherosclerosis. 4. Chronic atrophy of pancreatic body and tail, progressive since 2019 CT abdomen study, without discrete mass on this noncontrast CT, indeterminate but presumably due to progressive chronic pancreatitis. 5. Aortic Atherosclerosis (ICD10-I70.0) and Emphysema (ICD10-J43.9).     Electronically Signed   By: Ilona Sorrel M.D.   On: 10/03/2020 16:53  SErology  Results for LEANORD, THIBEAU" (MRN [id number]) as of 01/02/2021 14:44  Ref. Range 11/13/2020 16:09  Anti Nuclear Antibody (ANA) Latest Ref Range: NEGATIVE  NEGATIVE  Angiotensin-Converting Enzyme Latest Ref Range: 9 - 67 U/L 27  Cyclic Citrullin Peptide Ab Latest Units: UNITS <16  ds DNA Ab Latest Units: IU/mL <1  Myeloperoxidase Abs Latest Units: AI <1.0  Serine Protease 3 Latest Units: AI <1.0  RA Latex Turbid. Latest Ref Range: <14 IU/mL <14  SSA (Ro) (ENA) Antibody, IgG Latest Ref Range: <1.0 NEG AI <1.0 NEG  SSB (La) (ENA) Antibody, IgG Latest Ref Range: <1.0 NEG AI <1.0 NEG  Scleroderma (Scl-70) (ENA) Antibody, IgG Latest Ref Range: <1.0 NEG AI <1.0 NEG    eCHO arch 2019  Study Conclusions   - Left ventricle: The cavity size was normal. Wall thickness was    normal. Systolic function was normal. The estimated ejection    fraction was in the range of 55% to 60%. Wall motion was normal;    there were no regional wall motion abnormalities. Doppler    parameters are consistent with abnormal left ventricular    relaxation (grade 1 diastolic dysfunction).  - Mitral valve: There was mild regurgitation.  - Pulmonary arteries: Systolic pressure was moderately increased.    PA peak pressure: 40 mm Hg (S).     OV 02/14/2021 -  telephine visit  Subjective:  Patient ID: Luke [name], male , DOB: 1945/06/09 , age 43 y.o. , MRN: [id number] , ADDRESS: [place] [phone] PCP Tonia Ghent, MD Patient Care Team: Tonia Ghent, MD as PCP - General Angelia Mould, MD (Vascular Surgery) Arta Silence, MD (Gastroenterology) Michael Boston, MD (General Surgery) Eustace Moore, MD (Neurosurgery) Juluis Rainier [facility]) Jerline Pain, MD (Cardiology) [name], [facility] as Pharmacist (Pharmacist)   Type of visit: Telephone/Video Circumstance: COVID-19 national emergency Identification of patient OTHON GUARDIA with 1945-08-02 and MRN [id number] - 2 person identifier Risks: Risks, benefits, limitations of telephone visit explained. Patient understood and verbalized agreement to proceed Anyone else on call: just patient Patient location: [phone]  This provider location: [place], [employer]  This Provider for this visit: Treatment Team:  Attending Provider: Brand Males, MD    02/14/2021 -  FU IPF with some emphysema. Has sCHF  HPI OTHAR CURTO 76 y.o. -telephone visit to  see how his esbriet started is going butt turns out he says he never got referred to pharmacist. Pharmacist confirmed that she is yet to meet iwht him. No esbriet started. Says even spirivan not started. Also, supposed to be telephone visit but got confusing call and he showed up at front desk only to be sent home. Apologized for gap in customer service. Overall well. He said after last vsit he read more about esbriet and got apprehensive esp in light of CKD but lab review shows GFR > 60 this year. 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  Instructions   - Slowly increase the dose per protocol  -Always take it with food  -Any nausea you can try ginger capsules  -Give at least between 5 and 6  hours between dosing  -Good idea to participate in [place] sponsored medication support program - this is optional  -Definitely apply sunscreen when you go out with this medication  - You will need monthly liver tests for 6 months and thereafter every 3 months  - explained in decreaesd renal functio that side ffects could be higher but his GFR is good enough for full dose  - explained benefit of slowing fibrosis progression and that NNT of 1 to 6 is pretty good   We resolved we will jsu start and titrate up to max dose of 2 pills tid which is 2/3rd normal dose   CT Chest data  No results found.  Results for ATHANASIUS, KESLING" (MRN [id number]) as of 02/14/2021 10:42  Ref. Range 10/23/2020 10:57  Creatinine Latest Ref Range: 0.76 - 1.27 mg/dL 1.17  Results for DALIN, CALDERA" (MRN [id number]) as of 02/14/2021 10:42  Ref. Range 01/02/2021 15:40  AST Latest Ref Range: 0 - 37 U/L 17  ALT Latest Ref Range: 0 - 53 U/L 16    PFT  No flowsheet data found.   OV 05/10/2021  Subjective:  Patient ID: Luke [name], male , DOB: [birth year] , age 61 y.o. , MRN: [id number] , ADDRESS: [place] [address] PCP Tonia Ghent, MD Patient Care Team: Tonia Ghent, MD as PCP - General Angelia Mould, MD (Vascular Surgery) Arta Silence, MD (Gastroenterology) Michael Boston, MD (General Surgery) Eustace Moore, MD (Neurosurgery) Juluis Rainier (Optometry) Jerline Pain, MD (Cardiology) [name], [facility] as Pharmacist (Pharmacist)  This Provider for this visit: Treatment Team:  Attending Provider: Brand Males, MD    05/10/2021 -   Chief Complaint  Patient presents with   Follow-up    PFT performed today.  Pt states he has been doing okay since last visit and denies any complaints.   02/14/2021 -  FU IPF with some emphysema. Has sCHF = - esbriet shipment at his home 03/23/21  - on esbiret 2 pills tid due to CKD hx  (ofev 2nd choice due to his heart issues)     HPI Luke [name] 76 y.o. -presents for follow-up.  Since his last visit he started pirfenidone.  After starting pirfenidone he developed COVID-19.  In the midst of all this he had to hold his COVID.  He also had diarrhea.  It was not fully clear in November 2022 whether the diarrhea was from [place] or from pirfenidone.  Nevertheless sometime around Thanksgiving he restarted pirfenidone.  He slowly escalated himself within a week to 2 pills 3 times  daily.  He is by choice at 2 pills 3 times daily because of a history of chronic kidney disease although most recent creatinine and GFR were greater than 60.  He says currently is doing 2 pills 3 times daily [submaximal but still therapeutic effect] and is tolerating this well without any GI side effects.  He does have chronic dry skin with flaking.  He says it is refractory.  We discussed about trying flaxseed.  He will do that.  We reviewed his first pulmonary function test.  Between the [facility] and DLCO when averaged he has 60% lung capacity.  He is doing Spiriva but is not sure it is helping him.  His most recent liver function test was 1 month ago.  He will have another 1 today.        OV 08/08/2021  Subjective:  Patient ID: Luke [name], male , DOB: Jul 25, 1945 , age 75 y.o. , MRN: [id number] , ADDRESS: [place] [id number] PCP Tonia Ghent, MD Patient Care Team: Tonia Ghent, MD as PCP - General Angelia Mould, MD (Vascular Surgery) Arta Silence, MD (Gastroenterology) Michael Boston, MD (General Surgery) Eustace Moore, MD (Neurosurgery) Juluis Rainier (Optometry) Jerline Pain, MD (Cardiology) [name], [facility] as Pharmacist (Pharmacist)  This Provider for this visit: Treatment Team:  Attending Provider: Brand Males, MD    08/08/2021 -   Chief Complaint  Patient presents with   Follow-up    3 mo f/u for IPF. Stopped the Esbriet due to  increased dizziness combined with the isosorbide. Will resume in a few weeks.    02/14/2021 -  FU IPF with some emphysema. Has sCHF = - esbriet shipment at his home 03/23/21  - on esbiret 2 pills tid due to CKD hx (ofev 2nd choice due to his heart issues)  HPI Luke [name] 76 y.o. -returns for follow-up.  At this visit the original plan was to increase his pirfenidone but he tells me that while doing his daily exercises he started noticing chest pain on the treadmill.  This followed eye surgery in January 2023.  He had cardiac cath mid March 2023 and he had 100% blockage in one of his vessels.  His isosorbide was then increased.  He started feeling dizzy.  He attributed part of the dizziness due to pirfenidone.  He says pirfenidone was already giving him dizziness and this was made worse by the isosorbide.  He then decided to take his isosorbide half pill twice daily which helped his dizziness.  But he still was dizzy so he stopped his pirfenidone and the dizziness improved a lot.  He decided to protect his heart over his lung.  He has upcoming visit with cardiology in Sep 11, 2021.  Till then he wants to be on pirfenidone holiday.  After that he is definitely intending to restart the pirfenidone.  We discussed nintedanib and because of potential cardiac risk he does not want to do it.  We discussed clinical trials he is interested but he wants to rechallenge himself with pirfenidone first.  Currently he is walking 2 miles per day on a treadmill at 4 miles an hour without an incline and it takes 30 minutes.  Current symptom score is below.  Labs look okay .  The chest pain is improved  Regarding his dry skin: This is better after flaxseed.  He asked for some advice about reversing atherosclerosis.  I referred him to the [employer].  He understands this means strict vegetarian/vegan diet.  He admits readily that he is a carnivore.  In the last meat-based diet.   OV 10/03/2021  Subjective:   Patient ID: Luke [name], male , DOB: 1946-03-17 , age 80 y.o. , MRN: [id number] , ADDRESS: [place] [id number] PCP Tonia Ghent, MD Patient Care Team: Tonia Ghent, MD as PCP - General Jerline Pain, MD as PCP - Cardiology (Cardiology) Angelia Mould, MD (Vascular Surgery) Arta Silence, MD (Gastroenterology) Michael Boston, MD (General Surgery) Eustace Moore, MD (Neurosurgery) Juluis Rainier (Optometry) Jerline Pain, MD (Cardiology) [name], [facility] as Pharmacist (Pharmacist)  This Provider for this visit: Treatment Team:  Attending Provider: Brand Males, MD  02/14/2021 -  FU IPF with some emphysema. Has sCHF = - esbriet shipment at his home 03/23/21  - on esbiret 2 pills tid due to CKD hx (ofev kept as 2nd choice due to his heart issues)  -  esbriet on hold since mid march 2023 due to dizziness made by worse by nitrate and improved after stopping esbriet  - restart late may 2023  10/03/2021 -   Chief Complaint  Patient presents with   Follow-up    Pt states he has been doing okay since last visit and denies any real complaints.     HPI Luke [name] 75 y.o. -returns for follow-up.  He says he is doing well.  The symptoms are minimal.  He continues to exercise.  He says he has difficulty tolerating medication adds.  He has not been stable on the isosorbide which gave him dizziness in the setting of pirfenidone.  He stopped the pirfenidone although he attributed the dizziness to the isosorbide.  After stopping pirfenidone his dizziness went away.  Now for the last 20 days he is also been on valsartan.  He believes it was a new start.  For the last 1 week he is now back on pirfenidone at 1 pill 3 times daily.  There are no side effects right now.  He is trying to go to 2 pills 3 times daily [the maximum dose and account of his CKD].  He is worried that he can get side effects such as fogginess and headaches.  But he is willing  to take it and monitor.  He is interested in clinical trials but at this point in time his is.  Is not on a stable dose.  His symptom score is stable His pulmonary function test shows stability/improvement in FVC.  Last CT scan of the chest May 2022    SYMPTOM SCALE - ILD 01/02/2021  05/10/2021 191# 08/08/2021 188# -4 miles an hour on a treadmill for 30 minutes.  Covers 2 miles on no incline 10/03/2021   O2 use ra ra ra ra  Shortness of Breath 0 -> 5 scale with 5 being worst (score 6 If unable to do)     At rest 0 0 0 0  Simple tasks - showers, clothes change, eating, shaving 0 0 0   Household (dishes, doing bed, laundry) 0 0 0 0  Shopping 0 0 0 0  Walking level at own pace 0 0 0 00  Walking up Stairs '2 1 2 2  '$ Total (30-36) Dyspnea Score '2 1 2 2  '$ How bad is your cough? 0 0 0 00  How bad is your fatigue 00 0 0 0  How bad is nausea 0 0 0 0  How bad  is vomiting?  0 0 0 0  How bad is diarrhea? 00 0 0 0  How bad is anxiety? 0 0 0 0  How bad is depression 0 00 0 0  0      Simple office walk 185 feet x  3 laps goal with forehead probe 01/02/2021  08/08/2021   O2 used ra ra  Number laps completed 3 3  Comments about pace avg   Resting Pulse Ox/HR 97% and 71/min 98% and HR 71  Final Pulse Ox/HR 95% and 94/min 91% and HR 86  Desaturated </= 88% no no  Desaturated <= 3% points no Yes, 7 poins  Got Tachycardic >/= 90/min yes no  Symptoms at end of test none none  Miscellaneous comments x       PFT     Latest Ref Rng & Units 10/02/2021    8:45 AM 05/10/2021   11:45 AM  PFT Results  FVC-Pre L 4.09   3.63    FVC-Predicted Pre % 88   78    Pre FEV1/FVC % % 68   70    FEV1-Pre L 2.77   2.55    FEV1-Predicted Pre % 82   75    DLCO uncorrected ml/min/mmHg 13.10   11.58    DLCO UNC% % 48   43    DLCO corrected ml/min/mmHg 13.45   11.36    DLCO COR %Predicted % 50   42    DLVA Predicted % 62   56         has a past medical history of AAA (abdominal aortic aneurysm) ([place])  (2011), Anxiety, Atrial fibrillation ([place]), Automatic implantable cardioverter-defibrillator in situ, CAD (coronary artery disease), Cardiomyopathy ([place]), Diabetes mellitus, Drug therapy, Ejection fraction < 50%, Ejection fraction < 50%, Fall due to ice or snow (Feb.  19, 2015), HLD (hyperlipidemia), Hypertension, ICD (implantable cardioverter-defibrillator) in place, LBBB (left bundle branch block), Low testosterone, Myocardial infarction [facility]), Pacemaker, PAD (peripheral artery disease) ([place]), and Pancreatitis.   reports that he quit smoking about 17 years ago. His smoking use included cigarettes. He started smoking about 59 years ago. He has a 80.00 pack-year smoking history. He has never used smokeless tobacco.  Past Surgical History:  Procedure Laterality Date   ABDOMINAL AORTAGRAM N/A 10/28/2011   Procedure: ABDOMINAL [name];  Surgeon: Angelia Mould, MD;  Location: [facility] CATH LAB;  Service: Cardiovascular;  Laterality: N/A;   ABDOMINAL AORTIC ANEURYSM REPAIR     EVAR    BI-VENTRICULAR IMPLANTABLE CARDIOVERTER DEFIBRILLATOR N/A 07/16/2012   Procedure: BI-VENTRICULAR IMPLANTABLE CARDIOVERTER DEFIBRILLATOR  (CRT-D);  Surgeon: Deboraha Sprang, MD;  Location: [facility] CATH LAB;  Service: Cardiovascular;  Laterality: N/A;   BIV ICD GENERATOR CHANGEOUT N/A 10/25/2020   Procedure: BIV ICD GENERATOR CHANGEOUT;  Surgeon: Deboraha Sprang, MD;  Location: [facility] CV LAB;  Service: Cardiovascular;  Laterality: N/A;   CARDIAC CATHETERIZATION     COLONOSCOPY  03/23/2018; 2020   ESOPHAGOGASTRODUODENOSCOPY N/A 02/12/2019   Procedure: ESOPHAGOGASTRODUODENOSCOPY (EGD);  Surgeon: Jackquline Denmark, MD;  Location[facility] ENDOSCOPY;  Service: Endoscopy;  Laterality: N/A;   ESOPHAGOGASTRODUODENOSCOPY (EGD) WITH PROPOFOL N/A 02/05/2018   Procedure: ESOPHAGOGASTRODUODENOSCOPY (EGD) WITH PROPOFOL;  Surgeon: Mauri Pole, MD;  Location: [facility] ENDOSCOPY;  Service: Endoscopy;  Laterality: N/A;   ESOPHAGOGASTRODUODENOSCOPY  (EGD) WITH PROPOFOL N/A 09/27/2018   Procedure: ESOPHAGOGASTRODUODENOSCOPY (EGD) WITH PROPOFOL;  Surgeon: Carol Ada, MD;  Location: [place];  Service: Endoscopy;  Laterality: N/A;   FOREIGN BODY REMOVAL  09/27/2018  Procedure: FOREIGN BODY REMOVAL;  Surgeon: Carol Ada, MD;  Location: [facility] ENDOSCOPY;  Service: Endoscopy;;   FOREIGN BODY REMOVAL  02/12/2019   Procedure: FOREIGN BODY REMOVAL;  Surgeon: Jackquline Denmark, MD;  Location[facility] ENDOSCOPY;  Service: Endoscopy;;   HERNIA REPAIR  Jan. 9, 2015   INSERTION OF MESH N/A 05/14/2013   Procedure: INSERTION OF MESH;  Surgeon: Adin Hector, MD;  Location: [place];  Service: General;  Laterality: N/A;   LEFT HEART CATH AND CORONARY ANGIOGRAPHY N/A 07/17/2021   Procedure: LEFT HEART CATH AND CORONARY ANGIOGRAPHY;  Surgeon: Troy Sine, MD;  Location: [place] CV LAB;  Service: Cardiovascular;  Laterality: N/A;   LOWER EXTREMITY ANGIOGRAM Bilateral 10/28/2011   Procedure: LOWER EXTREMITY ANGIOGRAM;  Surgeon: Angelia Mould, MD;  Location[facility] CATH LAB;  Service: Cardiovascular;  Laterality: Bilateral;   PACEMAKER INSERTION  07-16-12   pacemaker/defibrilator   POLYPECTOMY     POSTERIOR CERVICAL FUSION/FORAMINOTOMY N/A 06/09/2014   Procedure: Laminectomy - Cervical two-Cervcial four posterior cervical instrumented fusion Cervical two-cervical four;  Surgeon: Eustace Moore, MD;  Location: [place] NEURO ORS;  Service: Neurosurgery;  Laterality: N/A;  posterior    SAVORY DILATION N/A 02/05/2018   Procedure: SAVORY DILATION;  Surgeon: Mauri Pole, MD;  Location: [facility] ENDOSCOPY;  Service: Endoscopy;  Laterality: N/A;   TONSILLECTOMY     as a child    UMBILICAL HERNIA REPAIR N/A 05/14/2013   Procedure: LAPAROSCOPIC exploration and repair of hernia in abdominal ;  Surgeon: Adin Hector, MD;  Location: [place];  Service: General;  Laterality: N/A;   UPPER GASTROINTESTINAL ENDOSCOPY  2020    Allergies  Allergen Reactions   Aldactone  [Spironolactone] Other (See Comments)    Hyperkalemia   Codeine Rash and Hives   Atorvastatin     Myalgias with lipitor.  Does tolerate simvastatin.     Januvia [Sitagliptin] Other (See Comments)    Diarrhea and heart racing   Jardiance [Empagliflozin] Other (See Comments)    Polyuria; excessive weight loss   Lisinopril     Possible cause of pancreatitis    Immunization History  Administered Date(s) Administered   Fluad Quad(high Dose 65+) 02/23/2020, 04/13/2021   Influenza Split 01/31/2011, 02/11/2012   Influenza Whole 02/27/2010   Influenza,inj,Quad PF,6+ Mos 03/18/2013, 03/25/2014, 01/19/2015, 02/21/2016, 02/27/2017, 02/12/2018, 02/18/2019   Influenza-Unspecified 03/03/2020   PFIZER(Purple Top)SARS-COV-2 Vaccination 05/23/2019, 06/12/2019   Pneumococcal Conjugate-13 10/18/2014   Pneumococcal Polysaccharide-23 11/14/2011   Td 07/05/2010    Family History  Problem Relation Age of Onset   Hypertension Mother    Stroke Mother    Hyperlipidemia Mother    Diabetes Sister    Heart disease Sister        Before age 38   Hypertension Sister    Hyperlipidemia Sister    Heart attack Sister    Lung cancer Father    Hypertension Son    Colon cancer Neg Hx    Prostate cancer Neg Hx    Esophageal cancer Neg Hx    Stomach cancer Neg Hx    Rectal cancer Neg Hx    Colon polyps Neg Hx      Current Outpatient Medications:    albuterol (VENTOLIN HFA) 108 (90 Base) MCG/ACT inhaler, Inhale 2 puffs into the lungs every 6 (six) hours as needed for wheezing or shortness of breath., Disp: 8 g, Rfl: 6   amLODipine (NORVASC) 10 MG tablet, TAKE 1 TABLET(10 MG) BY MOUTH DAILY, Disp: 90 tablet, Rfl:  3   aspirin EC 81 MG tablet, Take 81 mg by mouth every evening., Disp: , Rfl:    carvedilol (COREG) 25 MG tablet, TAKE 1 TABLET BY MOUTH TWICE DAILY WITH MEALS, Disp: 180 tablet, Rfl: 3   Cholecalciferol (VITAMIN D3) 10 MCG (400 UNIT) CAPS, Take 1 capsule by mouth daily., Disp: , Rfl:    glipiZIDE  (GLUCOTROL) 5 MG tablet, TAKE 1 TABLET(5 MG) BY MOUTH TWICE DAILY BEFORE A MEAL, Disp: 180 tablet, Rfl: 1   glucose blood (ACCU-CHEK AVIVA PLUS) test strip, USE TO CHECK BLOOD GLUCOSE UP TO TWICE DAILY., Disp: 200 strip, Rfl: 3   insulin glargine (LANTUS SOLOSTAR) 100 UNIT/ML Solostar Pen, INJECT 0.3 TO 0.35 MLS(30 TO 35 UNITS) INTO THE SKIN EVERY DAY, Disp: 15 mL, Rfl: 3   isosorbide mononitrate (IMDUR) 30 MG 24 hr tablet, Take 2 tablets (60 mg total) by mouth daily., Disp: 180 tablet, Rfl: 3   metFORMIN (GLUCOPHAGE) 500 MG tablet, TAKE 2 TABLETS BY MOUTH TWICE DAILY WITH FOOD, Disp: 360 tablet, Rfl: 3   Multiple Vitamin (MULTIVITAMIN WITH MINERALS) TABS tablet, Take 1 tablet by mouth every evening., Disp: , Rfl:    omeprazole (PRILOSEC) 40 MG capsule, TAKE 1 CAPSULE(40 MG) BY MOUTH TWICE DAILY, Disp: 180 capsule, Rfl: 0   simvastatin (ZOCOR) 40 MG tablet, Take 1 tablet (40 mg total) by mouth at bedtime. Has tolerated even with amlodipine use.  He didn't tolerate atorvastatin., Disp: 90 tablet, Rfl: 3   umeclidinium bromide (INCRUSE ELLIPTA) 62.5 MCG/INH AEPB, Inhale 1 puff into the lungs daily., Disp: 30 each, Rfl: 5   valsartan (DIOVAN) 40 MG tablet, Take 1 tablet (40 mg total) by mouth daily., Disp: 30 tablet, Rfl: 11   vitamin B-12 (CYANOCOBALAMIN) 1000 MCG tablet, Take 1,000 mcg by mouth in the morning., Disp: , Rfl:       Objective:   Vitals:   10/03/21 0848  BP: 132/80  Pulse: 74  Temp: 97.9 F (36.6 C)  TempSrc: Oral  SpO2: 95%  Weight: 184 lb (83.5 kg)  Height: '6\' 2"'$  (1.88 m)    Estimated body mass index is 23.62 kg/m as calculated from the following:   Height as of this encounter: '6\' 2"'$  (1.88 m).   Weight as of this encounter: 184 lb (83.5 kg).  '@WEIGHTCHANGE'$ @  [employer]   10/03/21 0848  Weight: 184 lb (83.5 kg)     Physical Exam General: No distress. Looks well Neuro: Alert and Oriented x 3. GCS 15. Speech normal Psych: Pleasant Resp:  Barrel Chest - no.   Wheeze - no, Crackles - yes at base, No overt respiratory distress CVS: Normal heart sounds. Murmurs - no Ext: Stigmata of Connective Tissue Disease - no HEENT: Normal upper airway. PEERL +. No post nasal drip        Assessment:       ICD-10-CM   1. IPF (idiopathic pulmonary fibrosis) (HCC)  A83.419 Pulmonary function test    CBC with Differential/Platelet    Hepatic function panel    Basic metabolic panel    B Nat Peptide    B Nat Peptide    Basic metabolic panel    Hepatic function panel    CBC with Differential/Platelet    2. Medication monitoring encounter  Z51.81 Pulmonary function test    CBC with Differential/Platelet    Hepatic function panel    Basic metabolic panel    B Nat Peptide    B Nat Peptide    Basic  metabolic panel    Hepatic function panel    CBC with Differential/Platelet    3. Shortness of breath  R06.02 B Nat Peptide    B Nat Peptide         Plan:     Patient Instructions     ICD-10-CM   1. IPF (idiopathic pulmonary fibrosis) (HCC)  J84.112 Hepatic function panel    Hepatic function panel    2. Centrilobular emphysema ([place])  J43.2     3. 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[place])  J43.9    J84.10      Emphysema -   Stable  Plan -continue spiriva respimat 2 puff daily  - continue  albuterol prn   Re IPF  - sstable - back on esbriet x 1 week as re challenge (prior dizziness in setting of  nitrate)  Plan - cotninue rechallenge esbriet fresh per protocol  -  1 pill three times daily with food, 6h apart and wear sunscreen and 1 week later at 2 pill thress times daily  - no ofev as discussed for the moment - check cbc, bmet, bnp, lft 10/03/2021 - clinical trial consideration as discused later in fall 2023  Followup -   - spiro/dlco in 8-12 weeks - Preslyn Warr 30 min visit after spiro/dlco in 8-12 weeks but after PFT   - symptom score and walk test at followup   He has high complex medical condition that requires  intensive therapeutic monitoring   SIGNATURE    Dr. Brand Males, M.D., F.C.C.P,  Pulmonary and Critical Care Medicine Staff Physician, [place] Director - Interstitial Lung Disease  Program  P[facility] [place] [facility] [place], [place], [address]  Pager: [phone], If no answer or between  15:00h - 7:00h: call [phone] Telephone: [phone]  9:20 AM 10/03/2021

## 2021-10-03 NOTE — Patient Instructions (Addendum)
ICD-10-CM   1. IPF (idiopathic pulmonary fibrosis) (HCC)  J84.112 Hepatic function panel    Hepatic function panel    2. Centrilobular emphysema ([place])  J43.2     3. 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[place])  J43.9    J84.10      Emphysema -   Stable  Plan -continue spiriva respimat 2 puff daily  - continue  albuterol prn   Re IPF  - sstable - back on esbriet x 1 week as re challenge (prior dizziness in setting of  nitrate)  Plan - cotninue rechallenge esbriet fresh per protocol  -  1 pill three times daily with food, 6h apart and wear sunscreen and 1 week later at 2 pill thress times daily  - no ofev as discussed for the moment - check cbc, bmet, bnp, lft 10/03/2021 - clinical trial consideration as discused later in fall 2023  Followup -   - spiro/dlco in 8-12 weeks - Naleigha Raimondi 30 min visit after spiro/dlco in 8-12 weeks but after PFT   - symptom score and walk test at followup

## 2021-10-09 DIAGNOSIS — H43821 Vitreomacular adhesion, right eye: Secondary | ICD-10-CM | POA: Diagnosis not present

## 2021-10-09 DIAGNOSIS — E113593 Type 2 diabetes mellitus with proliferative diabetic retinopathy without macular edema, bilateral: Secondary | ICD-10-CM | POA: Diagnosis not present

## 2021-10-09 DIAGNOSIS — H2511 Age-related nuclear cataract, right eye: Secondary | ICD-10-CM | POA: Diagnosis not present

## 2021-10-12 ENCOUNTER — Telehealth: Payer: Self-pay | Admitting: Internal Medicine

## 2021-10-12 ENCOUNTER — Telehealth: Payer: Self-pay

## 2021-10-12 NOTE — Telephone Encounter (Signed)
[employer] is calling in asking for a call back in regards to a prior authorization, they are needing some answers to some questions they have. Will be faxing over information.

## 2021-10-12 NOTE — Telephone Encounter (Signed)
Received PA for hydralazine 25 mg tablets. Looked thru EMR and did not see where patient was supposed to be on this medication anymore since around march 2023. Called [employer] where PA came from and asked why is was started and they said patient called and asked for the medication cost to be lowered. Called patient to verify is he is still taking the rx; I saw he is on 3 other BP medications. Patient states he has still been on this but wonders if he still needs to be? He has an appt on 10/15/21 with Dr. Damita Dunnings and wants to discuss this then. FYI for Mondays visit.

## 2021-10-15 ENCOUNTER — Ambulatory Visit (INDEPENDENT_AMBULATORY_CARE_PROVIDER_SITE_OTHER): Payer: Medicare Other | Admitting: Family Medicine

## 2021-10-15 ENCOUNTER — Encounter: Payer: Self-pay | Admitting: Family Medicine

## 2021-10-15 VITALS — BP 142/80 | HR 68 | Temp 97.8°F | Ht 74.0 in | Wt 182.0 lb

## 2021-10-15 DIAGNOSIS — I2510 Atherosclerotic heart disease of native coronary artery without angina pectoris: Secondary | ICD-10-CM | POA: Diagnosis not present

## 2021-10-15 DIAGNOSIS — E1159 Type 2 diabetes mellitus with other circulatory complications: Secondary | ICD-10-CM | POA: Diagnosis not present

## 2021-10-15 DIAGNOSIS — I10 Essential (primary) hypertension: Secondary | ICD-10-CM | POA: Diagnosis not present

## 2021-10-15 LAB — POCT GLYCOSYLATED HEMOGLOBIN (HGB A1C): Hemoglobin A1C: 7.3 % — AB (ref 4.0–5.6)

## 2021-10-15 MED ORDER — HYDRALAZINE HCL 25 MG PO TABS
25.0000 mg | ORAL_TABLET | Freq: Three times a day (TID) | ORAL | Status: DC
Start: 2021-10-15 — End: 2021-12-17

## 2021-10-15 NOTE — Patient Instructions (Addendum)
Take care.  Glad to see you. I'll update cardiology about your meds.  I'll check with pharmacy about hydralazine and incruse pricing.   Plan on recheck in about 6 months- yearly visit.  Labs ahead of time.   If you have my more lows, then stop the glipizide and let me know.

## 2021-10-15 NOTE — Telephone Encounter (Signed)
Noted. Thanks. See OV note.  

## 2021-10-15 NOTE — Progress Notes (Unsigned)
BP meds d/w pt.  The plan was for him to be off hydralazine after discharge from inpatient stay in 08/2021 due to lower BP but patient has been on medicine in the meantime.  He started valsartan in the meantime. His BP has been 120s/70s at home.  Lightheaded cautions d/w pt, he can get lightheaded on standing briefly.  No syncope.  Most recent Cr 1.18 on diovan.  We talked about leaving his statin as is given the trouble he had with other meds.    I told him I would check with pharmacy about hydralazine and incruse pricing and that I would update cardiology.  He is still walking 2 miles at a time, in 45mn.  No SOB unless sig exertion as expected.  No CP.    He is still seeing pulmonary at baseline.    Diabetes:  Using medications without difficulties: metformin, 30 units insulin, glipizide.  Hypoglycemic episodes: rare, cautions d/w pt.  He was aware and corrected with snack.  Hyperglycemic episodes: no Feet problems: he has neuropathy at baseline.   Blood Sugars averaging:80-100, occ higher or lower, depending on diet.   eye exam within last year: yes  Meds, vitals, and allergies reviewed.  ROS: Per HPI unless specifically indicated in ROS section   GEN: nad, alert and oriented HEENT: ncat NECK: supple w/o LA CV: rrr. PULM: ctab, no inc wob ABD: soft, +bs EXT: no edema SKIN: well perfused.    35 minutes were devoted to patient care in this encounter (this includes time spent reviewing the patient's file/history, interviewing and examining the patient, counseling/reviewing plan with patient).

## 2021-10-16 ENCOUNTER — Other Ambulatory Visit (HOSPITAL_COMMUNITY): Payer: Self-pay

## 2021-10-16 MED ORDER — UMECLIDINIUM BROMIDE 62.5 MCG/ACT IN AEPB: 1.0000 | INHALATION_SPRAY | Freq: Every day | RESPIRATORY_TRACT | 5 refills | Status: DC

## 2021-10-16 NOTE — Telephone Encounter (Signed)
Rx on file was an old Rx so have renewed Rx at pt's pharmacy.

## 2021-10-17 NOTE — Telephone Encounter (Signed)
[employer] called back about PA and said it was approved from 10/11/21-10/12/2022

## 2021-10-17 NOTE — Assessment & Plan Note (Signed)
Continue current medications as is, including hydralazine and I will route this to cardiology as FYI about his current medications/blood pressure.

## 2021-10-17 NOTE — Assessment & Plan Note (Signed)
If any more lows, then he will stop glipizide and let me know.   Continue metformin and insulin for now.

## 2021-10-19 ENCOUNTER — Telehealth: Payer: Self-pay | Admitting: Pharmacist

## 2021-10-19 ENCOUNTER — Ambulatory Visit (INDEPENDENT_AMBULATORY_CARE_PROVIDER_SITE_OTHER): Payer: Medicare Other | Admitting: Pharmacist

## 2021-10-19 DIAGNOSIS — E7800 Pure hypercholesterolemia, unspecified: Secondary | ICD-10-CM

## 2021-10-19 DIAGNOSIS — I25118 Atherosclerotic heart disease of native coronary artery with other forms of angina pectoris: Secondary | ICD-10-CM

## 2021-10-19 DIAGNOSIS — I5022 Chronic systolic (congestive) heart failure: Secondary | ICD-10-CM

## 2021-10-19 DIAGNOSIS — I10 Essential (primary) hypertension: Secondary | ICD-10-CM

## 2021-10-19 DIAGNOSIS — E1159 Type 2 diabetes mellitus with other circulatory complications: Secondary | ICD-10-CM

## 2021-10-19 DIAGNOSIS — J84112 Idiopathic pulmonary fibrosis: Secondary | ICD-10-CM

## 2021-10-19 NOTE — Patient Instructions (Signed)
Visit Information  Phone number for Pharmacist: [phone]   Goals Addressed   None     Care Plan : [place]  Updates made by Charlton Haws, RPH since 10/19/2021 12:00 AM     Problem: Hypertension, Hyperlipidemia, Diabetes, Heart Failure, Coronary Artery Disease, and IPF   Priority: High     Long-Range Goal: Disease Management   Start Date: 06/13/2020  Expected End Date: 10/20/2022  This Visit's Progress: On track  Recent Progress: On track  Priority: High  Note:   Current Barriers:  Unable to independently afford treatment regimen  Pharmacist Clinical Goal(s):  Patient will verbalize ability to afford treatment regimen through collaboration with PharmD and provider.   Interventions: 1:1 collaboration with Tonia [place], MD regarding development and update of comprehensive plan of care as evidenced by provider attestation and co-signature Inter-disciplinary care team collaboration (see longitudinal plan of care) Comprehensive medication review performed; medication list updated in electronic medical record  Hypertension / Heart Failure(BP goal <140/90) -Controlled - per home readings; pt reports hydralazine is Tier 3 and cost-prohibitive -Current home readings: none available -Reports long history of home readings much lower than in clinic - White coat syndrome -Last ejection fraction: 40-45% (Date: 07/2021) - worse than 07/2017 60-65% -HF type: Combined Systolic and Diastolic -Current treatment: Amlodipine 10 mg daily - Appropriate, Effective, Safe, Accessible Carvedilol '25mg'$  BID - Appropriate, Effective, Safe, Accessible Hydralazine 25 mg TID - Appropriate, Effective, Safe, Query Accessible Isosorbide MN 30 mg - 2 tab daily - Appropriate, Effective, Safe, Accessible Valsartan 40 mg daily - Appropriate, Effective, Safe, Accessible -Medications previously tried: lisinopril (pancreatitis), aldactone (hyperkalemia); denies history of  thiazide -Continue monitoring BP at home weekly -Reviewed hydralazine benefits in HF and HTN; thiazide diuretics are preferred over hydralazine however pt has hx of urinary frequency/incontinence as well as diabetes so would defer use of thiazide at thi stime -Recommended to continue current medication; pursue tier exception for hydralazine  Hyperlipidemia/CAD: (LDL goal < 70) -Controlled - LDL 74 (04/2021);  Pt affirms daily adherence to statin and aspirin 81 mg. -Current treatment: Simvastatin 40 mg daily - Appropriate, Effective, Safe, Accessible Aspirin 81 mg daily -Appropriate, Effective, Safe, Accessible -Medications previously tried: atorvastatin (leg cramps) -Reviewed cholesterol goals. -Recommended to continue current medication  Diabetes (A1c goal <7%) -Not ideally controlled - A1c 7.3% (10/2021) -History: Reports started insulin 2019 due to A1c consistently above 8% and could not get it down  -Current home glucose readings - checks twice daily -Reports hypoglycemic symptoms rare. Denies hyperglycemic symptoms. -Current medications: Lantus 30 units daily (PAP) - Appropriate, Effective, Safe, Accessible Metformin '500mg'$  - 2 tab BID -Appropriate, Effective, Safe, Accessible Glipizide 5 mg BID - Appropriate, Effective, Safe, Accessible -Medications previously tried: Jardiance (urinary frequency); Not a candidate for GLP-1 due to history of pancreatitis with lisinopril -Recommended to continue current medication; consider CGM - not discussed today   IPF (Goal: manage symptoms) -Controlled - however Incruse is cost-prohibitive due to high deductible -Follows with pulmonary (Dr Chase Caller) -Current treatment  Incruse Ellipta 1 puff daily - Appropriate, Effective, Safe, Query Accessible Albuterol HFA - Appropriate, Effective, Safe, Accessible -Medications previously tried: Esbriet, Spiriva (non-preferred) -reviewed Incruse PAP requirement - pt affirms he has spent > $600 on Rx costs  this year so he should qualify -Recommended to continue current medication; pursue PAP for Incruse  Patient Goals/Self-Care Activities Patient will:  - take medications as prescribed as evidenced by patient report and record review focus on medication adherence by  routine check glucose daily, document, and provide at future appointments check blood pressure daily, document, and provide at future appointments collaborate with provider on medication access solutions      Patient verbalizes understanding of instructions and care plan provided today and agrees to view in [place]. Active MyChart status and patient understanding of how to access instructions and care plan via MyChart confirmed with patient.    Telephone follow up appointment with pharmacy team member scheduled for: 6 months  Charlene Brooke, PharmD, [facility] Clinical Pharmacist [facility] at [facility] [phone]

## 2021-10-19 NOTE — Telephone Encounter (Signed)
Printed [employer] PAP forms for Incruse Ellipta. Placed formed at front desk for patient to sign and provide proof of income, pharmacy costs, and Medicare Part D insurance card. Pt will come to office to complete application. Will need hard-copy Rx faxed to company once complete.

## 2021-10-19 NOTE — Progress Notes (Signed)
Chronic Care Management Pharmacy Note  10/19/2021 Name:  Luke [name] MRN:  [id number] DOB:  04/19/1946  Summary: CCM F/U visit -Reviewed cost concerns: pt reports hydralazine and Incruse Ellipta are cost-prohibitive -Incruse is costly due to high deductible, pt cannot afford to meet deductible; per pt report he has spent > $600 on drugs this year so he should qualify for [place] PAP -Hydralazine is costly due to Tier 3 status with insurance; he is on GDMT for heart failure and hydralazine is indicated; thiazide diuretics are preferred per insurance however pt has hx of urinary frequency/incontinence and difficult-to-control diabetes so thiazides are not ideal for him, hydralazine is clinically preferred  Recommendations/Changes made from today's visit: -Pursue PAP for Incruse -Hydralazine tier exception  Plan: -[place] will follow up with [place] for Incruse PAP -Pharmacist follow up televisit scheduled for 6 months -PCP 04/16/22    Subjective: Luke [name] is an 76 y.o. year old male who is a primary patient of Damita Dunnings, Elveria Rising, MD.  The CCM team was consulted for assistance with disease management and care coordination needs.    Engaged with patient by telephone for follow up visit in response to provider referral for pharmacy case management and/or care coordination services.   Consent to Services:  The patient was given information about Chronic Care Management services, agreed to services, and gave verbal consent prior to initiation of services.  Please see initial visit note for detailed documentation.   Patient Care Team: Tonia Ghent, MD as PCP - General Jerline Pain, MD as PCP - Cardiology (Cardiology) Angelia Mould, MD (Vascular Surgery) Arta Silence, MD (Gastroenterology) Michael Boston, MD (General Surgery) Eustace Moore, MD (Neurosurgery) Juluis Rainier (Optometry) Jerline Pain, MD (Cardiology) Charlton Haws, [facility] as  Pharmacist (Pharmacist)  Recent office visits: 10/15/21 Dr Damita Dunnings OV: f/u - rx'd hydralazine 25 mg TID. If he has any more low BG, will stop glipizide.   Recent consult visits: 10/12/21 Pulmonary TE: Incruse cost high due to deductible, will be $37 once met.  10/03/21 Dr Chase Caller (Pulmonary): IPF. Restart Esbriet x 1 week as re-challenge (stopped due dizziness previously);   09/11/21 NP Christen Bame (Cardiology): f/u CAD, HF. Start Valsartan 40 mg daily (GDMT)  Hospital visits: None in previous 6 months   Objective:  Lab Results  Component Value Date   CREATININE 1.18 10/03/2021   BUN 27 (H) 10/03/2021   GFR 60.58 10/03/2021   EGFR 71 07/11/2021   GFRNONAA 58 (L) 08/22/2021   GFRAA >60 02/12/2019   NA 133 (L) 10/03/2021   K 5.0 10/03/2021   CALCIUM 9.9 10/03/2021   CO2 26 10/03/2021   GLUCOSE 257 (H) 10/03/2021    Lab Results  Component Value Date/Time   HGBA1C 7.3 (A) 10/15/2021 09:57 AM   HGBA1C 7.8 (H) 04/13/2021 04:28 PM   HGBA1C 7.2 (H) 09/07/2020 07:38 AM   GFR 60.58 10/03/2021 09:18 AM   GFR 57.17 (L) 02/16/2020 08:06 AM   MICROALBUR 1.7 10/07/2008 12:00 AM    Last diabetic Eye exam:  Lab Results  Component Value Date/Time   HMDIABEYEEXA Retinopathy (A) 12/11/2020 12:00 AM    Last diabetic Foot exam:  Lab Results  Component Value Date/Time   HMDIABFOOTEX yes 07/05/2010 12:00 AM     Lab Results  Component Value Date   CHOL 147 04/13/2021   HDL 37 (L) 04/13/2021   LDLCALC 74 04/13/2021   LDLDIRECT 62.0 02/16/2020   TRIG 276 (H) 04/13/2021  CHOLHDL 4.0 04/13/2021       Latest Ref Rng & Units 10/03/2021    9:18 AM 08/20/2021    8:44 PM 08/01/2021    2:32 PM  Hepatic Function  Total Protein 6.0 - 8.3 g/dL 6.9  7.6  6.8   Albumin 3.5 - 5.2 g/dL 4.1  4.2  4.1   AST 0 - 37 U/L _0 ALT 0 - 53 U/L _1 Alk Phosphatase 39 - 117 U/L 55  62  55   Total Bilirubin 0.2 - 1.2 mg/dL 0.8  1.0  0.4   Bilirubin, Direct 0.0 - 0.3 mg/dL 0.1    0.1     Lab Results  Component Value Date/Time   TSH 2.75 02/12/2018 01:01 PM   TSH 2.868 07/14/2012 04:33 PM       Latest Ref Rng & Units 10/03/2021    9:18 AM 08/22/2021   10:05 AM 08/22/2021    3:56 AM  CBC  WBC 4.0 - 10.5 K/uL 9.9  11.3  10.1   Hemoglobin 13.0 - 17.0 g/dL 13.3  13.7  12.7   Hematocrit 39.0 - 52.0 % 39.3  39.8  37.3   Platelets 150.0 - 400.0 K/uL 250.0  201  172     No results found for: "VD25OH"  Clinical ASCVD: Yes  The 10-year ASCVD risk score (Arnett DK, et al., 2019) is: 53.9%   Values used to calculate the score:     Age: 76 years     Sex: Male     Is Non-Hispanic African American: No     Diabetic: Yes     Tobacco smoker: No     Systolic Blood Pressure: 818 mmHg     Is BP treated: Yes     HDL Cholesterol: 37 mg/dL     Total Cholesterol: 147 mg/dL       04/13/2021    3:51 PM 03/14/2020    2:28 PM 02/18/2019    9:12 AM  Depression screen PHQ 2/9  Decreased Interest 0 0 0  Down, Depressed, Hopeless 0 0 0  PHQ - 2 Score 0 0 0  Altered sleeping  0   Tired, decreased energy  0   Change in appetite  0   Feeling bad or failure about yourself   0   Trouble concentrating  0   Moving slowly or fidgety/restless  0   Suicidal thoughts  0   PHQ-9 Score  0      Social History   Tobacco Use  Smoking Status Former   Packs/day: 2.00   Years: 40.00   Total pack years: 80.00   Types: Cigarettes   Start date: 1964   Quit date: 05/06/2004   Years since quitting: 17.4  Smokeless Tobacco Never   BP Readings from Last 3 Encounters:  10/15/21 (!) 142/80  10/03/21 132/80  09/11/21 140/68   Pulse Readings from Last 3 Encounters:  10/15/21 68  10/03/21 74  09/11/21 66   Wt Readings from Last 3 Encounters:  10/15/21 182 lb (82.6 kg)  10/03/21 184 lb (83.5 kg)  09/11/21 182 lb 12.8 oz (82.9 kg)   BMI Readings from Last 3 Encounters:  10/15/21 23.37 kg/m  10/03/21 23.62 kg/m  09/11/21 23.47 kg/m    Assessment/Interventions: Review of  patient past medical history, allergies, medications, health status, including review of consultants reports, laboratory and other test data, was performed as part of comprehensive evaluation and  provision of chronic care management services.   SDOH:  (Social Determinants of Health) assessments and interventions performed: Yes  SDOH Screenings   Alcohol Screen: Not on file  Depression (PHQ2-9): Low Risk  (04/13/2021)   Depression (PHQ2-9)    PHQ-2 Score: 0  Financial Resource Strain: Low Risk  (12/18/2020)   Overall Financial Resource Strain (CARDIA)    Difficulty of Paying Living Expenses: Not very hard  Food Insecurity: Not on file  Housing: Not on file  Physical Activity: Not on file  Social Connections: Not on file  Stress: Not on file  Tobacco Use: Medium Risk (10/15/2021)   Patient History    Smoking Tobacco Use: Former    Smokeless Tobacco Use: Never    Passive Exposure: Not on file  Transportation Needs: Not on file    CCM Care Plan  Allergies  Allergen Reactions   Aldactone [Spironolactone] Other (See Comments)    Hyperkalemia   Codeine Rash and Hives   Atorvastatin     Myalgias with lipitor.  Does tolerate simvastatin.     Januvia [Sitagliptin] Other (See Comments)    Diarrhea and heart racing   Jardiance [Empagliflozin] Other (See Comments)    Polyuria; excessive weight loss   Lisinopril     Possible cause of pancreatitis    Medications Reviewed Today     Reviewed by Tonia Ghent, MD (Physician) on 10/15/21 at 0934  Med List Status: <None>   Medication Order Taking? Sig Documenting Provider Last Dose Status Informant  albuterol (VENTOLIN HFA) 108 (90 Base) MCG/ACT inhaler [id number] Yes Inhale 2 puffs into the lungs every 6 (six) hours as needed for wheezing or shortness of breath. Brand Males, MD Taking Active Self, Pharmacy Records           Med Note Hassell Done, [place]   Tue Sep 11, 2021 10:56 AM)    amLODipine (NORVASC) 10 MG tablet [id number] Yes TAKE  1 TABLET(10 MG) BY MOUTH DAILY Jerline Pain, MD Taking Active Self, Pharmacy Records  aspirin EC 81 MG tablet [id number] Yes Take 81 mg by mouth every evening. [provider] Taking Active Self, Pharmacy Records  carvedilol (COREG) 25 MG tablet [id number] Yes TAKE 1 TABLET BY MOUTH TWICE DAILY WITH MEALS Skains, Thana Farr, MD Taking Active Self, Pharmacy Records  Cholecalciferol (VITAMIN D3) 10 MCG (400 UNIT) CAPS [id number] Yes Take 1 capsule by mouth daily. [provider] Taking Active Self, Pharmacy Records  glipiZIDE (GLUCOTROL) 5 MG tablet [id number] Yes TAKE 1 TABLET(5 MG) BY MOUTH TWICE DAILY BEFORE A MEAL Tonia Ghent, MD Taking Active   glucose blood (ACCU-CHEK AVIVA PLUS) test strip [id number] Yes USE TO CHECK BLOOD GLUCOSE UP TO TWICE DAILY. Tonia Ghent, MD Taking Active Self, Pharmacy Records  hydrALAZINE (APRESOLINE) 25 MG tablet [id number]  Take 1 tablet (25 mg total) by mouth 3 (three) times daily. Tonia Ghent, MD  Active   insulin glargine (LANTUS SOLOSTAR) 100 UNIT/ML Solostar Pen [id number] Yes INJECT 0.3 TO 0.35 MLS(30 TO 35 UNITS) INTO THE SKIN EVERY DAY Tonia Ghent, MD Taking Active Self, Pharmacy Records  isosorbide mononitrate (IMDUR) 30 MG 24 hr tablet [id number] Yes Take 2 tablets (60 mg total) by mouth daily. Jerline Pain, MD Taking Active   metFORMIN (GLUCOPHAGE) 500 MG tablet [id number] Yes TAKE 2 TABLETS BY MOUTH TWICE DAILY WITH FOOD Tonia Ghent, MD Taking Active Self, Pharmacy Records  Multiple Vitamin (MULTIVITAMIN WITH MINERALS) TABS tablet [id number] Yes Take 1 tablet by  mouth every evening. [provider] Taking Active Self, Pharmacy Records  omeprazole (PRILOSEC) 40 MG capsule [id number] Yes TAKE 1 CAPSULE(40 MG) BY MOUTH TWICE DAILY Nandigam, Venia Minks, MD Taking Active Self, Pharmacy Records  simvastatin (ZOCOR) 40 MG tablet [id number] Yes Take 1 tablet (40 mg total) by mouth at bedtime. Has tolerated even with amlodipine  use.  He didn't tolerate atorvastatin. Tonia Ghent, MD Taking Active Self, Pharmacy Records  umeclidinium bromide (INCRUSE ELLIPTA) 62.5 MCG/INH AEPB [id number] Yes Inhale 1 puff into the lungs daily. Brand Males, MD Taking Active Self, Pharmacy Records  valsartan (DIOVAN) 40 MG tablet [id number] Yes Take 1 tablet (40 mg total) by mouth daily. Swinyer, Lanice Schwab, NP Taking Active   vitamin B-12 (CYANOCOBALAMIN) 1000 MCG tablet [id number] Yes Take 1,000 mcg by mouth in the morning. [provider] Taking Active Self, Pharmacy Records           Med Note Lynn Ito   Tue Sep 11, 2021 10:57 AM)              Patient Active Problem List   Diagnosis Date Noted   Acute renal failure superimposed on stage 3a chronic kidney disease ([place]) 08/21/2021   Enteritis 08/21/2021   Exertional angina (HCC)    IPF (idiopathic pulmonary fibrosis) ([place]) 03/21/2021   Abnormal chest x-ray 09/17/2020   Pain due to onychomycosis of toenails of both feet 85/27/7824   Chronic systolic heart failure ([place]) 06/08/2019   Biventricular ICD (implantable cardioverter-defibrillator) in place 06/08/2019   Esophageal obstruction due to food impaction    Esophageal stricture    Dysphagia 11/19/2017   Pancreatitis 11/10/2017   Healthcare maintenance 11/22/2016   Tinnitus 05/23/2015   Medicare annual wellness visit, subsequent 10/20/2014   Advance care planning 10/20/2014   S/P cervical spinal fusion 06/09/2014   Stenosis of cervical spine with myelopathy ([place]) 12/27/2013   Preop cardiovascular exam 08/13/2013   Central cord syndrome ([place]) 06/24/2013   Encounter for fitting or adjustment of automatic implantable cardioverter-defibrillator 04/20/2013   Drug therapy    Status post abdominal aortic aneurysm repair 12/30/2012   HLD (hyperlipidemia) 09/16/2012   Ejection fraction < 50%    Complete heart block ([place]) 07/14/2012   SK (seborrheic keratosis) 02/12/2012   Shoulder pain 11/08/2011   CAD  with complaints of chest pain    Ischemic cardiomyopathy    LBBB (left bundle branch block)    PVD (peripheral vascular disease) ([place]) 10/08/2010   ORGANIC IMPOTENCE 07/05/2010   Essential hypertension, benign 02/27/2010   Type 2 diabetes mellitus with vascular disease ([place]) 11/16/2009    Immunization History  Administered Date(s) Administered   Fluad Quad(high Dose 65+) 02/23/2020, 04/13/2021   Influenza Split 01/31/2011, 02/11/2012   Influenza Whole 02/27/2010   Influenza,inj,Quad PF,6+ Mos 03/18/2013, 03/25/2014, 01/19/2015, 02/21/2016, 02/27/2017, 02/12/2018, 02/18/2019   Influenza-Unspecified 03/03/2020   PFIZER(Purple Top)SARS-COV-2 Vaccination 05/23/2019, 06/12/2019   Pneumococcal Conjugate-13 10/18/2014   Pneumococcal Polysaccharide-23 11/14/2011   Td 07/05/2010    Conditions to be addressed/monitored:  Hypertension, Hyperlipidemia, Diabetes, Heart Failure, Coronary Artery Disease, and IPF  Care Plan : CCM Pharmacy Care Plan  Updates made by Charlton Haws, [place] since 10/19/2021 12:00 AM     Problem: Hypertension, Hyperlipidemia, Diabetes, Heart Failure, Coronary Artery Disease, and IPF   Priority: High     Long-Range Goal: Disease Management   Start Date: 06/13/2020  Expected End Date: 10/20/2022  This Visit's Progress: On track  Recent Progress: On track  Priority: High  Note:  Current Barriers:  Unable to independently afford treatment regimen  Pharmacist Clinical Goal(s):  Patient will verbalize ability to afford treatment regimen through collaboration with PharmD and provider.   Interventions: 1:1 collaboration with Tonia Ghent, MD regarding development and update of comprehensive plan of care as evidenced by provider attestation and co-signature Inter-disciplinary care team collaboration (see longitudinal plan of care) Comprehensive medication review performed; medication list updated in electronic medical record  Hypertension / Heart Failure(BP  goal <140/90) -Controlled - per home readings; pt reports hydralazine is Tier 3 and cost-prohibitive -Current home readings: none available -Reports long history of home readings much lower than in clinic - White coat syndrome -Last ejection fraction: 40-45% (Date: 07/2021) - worse than 07/2017 60-65% -HF type: Combined Systolic and Diastolic -Current treatment: Amlodipine 10 mg daily - Appropriate, Effective, Safe, Accessible Carvedilol 63m BID - Appropriate, Effective, Safe, Accessible Hydralazine 25 mg TID - Appropriate, Effective, Safe, Query Accessible Isosorbide MN 30 mg - 2 tab daily - Appropriate, Effective, Safe, Accessible Valsartan 40 mg daily - Appropriate, Effective, Safe, Accessible -Medications previously tried: lisinopril (pancreatitis), aldactone (hyperkalemia); denies history of thiazide -Continue monitoring BP at home weekly -Reviewed hydralazine benefits in HF and HTN; thiazide diuretics are preferred over hydralazine however pt has hx of urinary frequency/incontinence as well as diabetes so would defer use of thiazide at thi stime -Recommended to continue current medication; pursue tier exception for hydralazine  Hyperlipidemia/CAD: (LDL goal < 70) -Controlled - LDL 74 (04/2021);  Pt affirms daily adherence to statin and aspirin 81 mg. -Current treatment: Simvastatin 40 mg daily - Appropriate, Effective, Safe, Accessible Aspirin 81 mg daily -Appropriate, Effective, Safe, Accessible -Medications previously tried: atorvastatin (leg cramps) -Reviewed cholesterol goals. -Recommended to continue current medication  Diabetes (A1c goal <7%) -Not ideally controlled - A1c 7.3% (10/2021) -History: Reports started insulin 2019 due to A1c consistently above 8% and could not get it down  -Current home glucose readings - checks twice daily -Reports hypoglycemic symptoms rare. Denies hyperglycemic symptoms. -Current medications: Lantus 30 units daily (PAP) - Appropriate,  Effective, Safe, Accessible Metformin 5076m- 2 tab BID -Appropriate, Effective, Safe, Accessible Glipizide 5 mg BID - Appropriate, Effective, Safe, Accessible -Medications previously tried: Jardiance (urinary frequency); Not a candidate for GLP-1 due to history of pancreatitis with lisinopril -Recommended to continue current medication; consider CGM - not discussed today   IPF (Goal: manage symptoms) -Controlled - however Incruse is cost-prohibitive due to high deductible -Follows with pulmonary (Dr RaChase Caller-Current treatment  Incruse Ellipta 1 puff daily - Appropriate, Effective, Safe, Query Accessible Albuterol HFA - Appropriate, Effective, Safe, Accessible -Medications previously tried: Esbriet, Spiriva (non-preferred) -reviewed Incruse PAP requirement - pt affirms he has spent > $600 on Rx costs this year so he should qualify -Recommended to continue current medication; pursue PAP for Incruse  Patient Goals/Self-Care Activities Patient will:  - take medications as prescribed as evidenced by patient report and record review focus on medication adherence by routine check glucose daily, document, and provide at future appointments check blood pressure daily, document, and provide at future appointments collaborate with provider on medication access solutions      Medication Assistance:  Lantus - [employer] PAP approved through 05/05/22 Incruse - [employer]AP pending patient signature/documents  Compliance/Adherence/Medication fill history: Care Gaps: None  Star-Rating Drugs: Glipizide - PDC 100% Metformin - PDC 100% Simvastatin - PDC 100% Valsartan - PDC 100%  Medication Access: Within the past 30 days, how often has patient missed a dose of medication? 0  Is a pillbox or other method used to improve adherence? Yes  Factors that may affect medication adherence? financial need Are meds synced by current pharmacy? No  Are meds delivered by current pharmacy? No  Does patient  experience delays in picking up medications due to transportation concerns? No   [facility] Services Reviewed: Is patient disadvantaged to use [facility]?: Yes  Current Rx insurance plan: BCBS [place] Name and location of Current pharmacy:  [facility] [place], [place] - [address] [place] [address] Phone: [phone] Fax: [phone]  [facility] services reviewed with patient today?: No  Reason patient declined to change pharmacies: Disadvantaged due to insurance/mail order   Care Plan and Follow Up Patient Decision:  Patient agrees to Care Plan and Follow-up.  Plan: Telephone follow up appointment with care management team member scheduled for:  6 months  Charlene Brooke, PharmD, BCACP Clinical Pharmacist [facility] [phone]

## 2021-10-21 ENCOUNTER — Telehealth: Payer: Self-pay | Admitting: Family Medicine

## 2021-10-21 DIAGNOSIS — R918 Other nonspecific abnormal finding of lung field: Secondary | ICD-10-CM

## 2021-10-21 NOTE — Telephone Encounter (Signed)
Please check with patient.  Due for 1 year follow-up chest CT regarding previously imaged pulmonary nodule.  I pended the order below.  If he consents I will sign it.  I routed this to Dr. Chase Caller as FYI in the meantime.  Thanks.

## 2021-10-22 ENCOUNTER — Encounter: Payer: Medicare Other | Admitting: Internal Medicine

## 2021-10-22 NOTE — Telephone Encounter (Signed)
Patient notified as instructed by telephone and verbalized understanding. Patient stated that he had some CT's done when he was in the hospital in April. Patient was advised that he had abdomen pelvis while in the hospital. Patient stated that he is okay for Dr. Damita Dunnings to order the chest CT if he needs it. Patient was advised that one of the referral coordinators will be in touch with him to get this set up.

## 2021-10-23 ENCOUNTER — Other Ambulatory Visit: Payer: Self-pay | Admitting: Family Medicine

## 2021-10-23 NOTE — Telephone Encounter (Signed)
Hi  This needs to be changed to HRCT supine and prone. We can take over addressing this if that would be  helpful. OR you can do it this time and we can take over next time  Thanks    SIGNATURE    Dr. Brand Males, M.D., F.C.C.P,  Pulmonary and Critical Care Medicine Staff Physician, [place] Director - Interstitial Lung Disease  Program  Medical Director - [facility] [place] at [place], [place], [address]  NPI Number:  NPI #[id number] [facility] Number: [id number]  Pager: [phone], If no answer  -> Check AMION or Try [phone] Telephone (clinical office): [phone] Telephone (research): [phone]  7:14 AM 10/23/2021

## 2021-10-23 NOTE — Telephone Encounter (Signed)
Patient completed paperwork for Incruse Ellipta - manufacturer assistance, now just needs a hard-copy prescription faxed to [place]: [phone]  Routing to prescriber to request faxed Rx.

## 2021-10-23 NOTE — Telephone Encounter (Signed)
I greatly appreciate input from Dr. Chase Caller.  I cancelled my order.  I would greatly appreciate you taking over ordering his CT now and in the future.

## 2021-10-24 ENCOUNTER — Ambulatory Visit (INDEPENDENT_AMBULATORY_CARE_PROVIDER_SITE_OTHER): Payer: Medicare Other

## 2021-10-24 DIAGNOSIS — I442 Atrioventricular block, complete: Secondary | ICD-10-CM | POA: Diagnosis not present

## 2021-10-24 MED ORDER — UMECLIDINIUM BROMIDE 62.5 MCG/ACT IN AEPB: 1.0000 | INHALATION_SPRAY | Freq: Every day | RESPIRATORY_TRACT | 3 refills | Status: DC

## 2021-10-24 NOTE — Telephone Encounter (Signed)
Rx printed for pt's Incruse. Will have MR sign 6/22 when he returns to clinic.

## 2021-10-24 NOTE — Addendum Note (Signed)
Addended by: Lorretta Harp on: 10/24/2021 09:14 AM   Modules accepted: Orders

## 2021-10-25 LAB — CUP PACEART REMOTE DEVICE CHECK
Battery Remaining Longevity: 83 mo
Battery Remaining Percentage: 87 %
Battery Voltage: 2.98 V
Brady Statistic AP VP Percent: 17 %
Brady Statistic AP VS Percent: 1 %
Brady Statistic AS VP Percent: 77 %
Brady Statistic AS VS Percent: 3.1 %
Brady Statistic RA Percent Paced: 14 %
Date Time Interrogation Session: 20230621020040
HighPow Impedance: 73 Ohm
Implantable Lead Implant Date: 20140313
Implantable Lead Implant Date: 20140313
Implantable Lead Implant Date: 20140313
Implantable Lead Location: 753858
Implantable Lead Location: 753859
Implantable Lead Location: 753860
Implantable Pulse Generator Implant Date: 20220622
Lead Channel Impedance Value: 390 Ohm
Lead Channel Impedance Value: 430 Ohm
Lead Channel Impedance Value: 810 Ohm
Lead Channel Pacing Threshold Amplitude: 0.625 V
Lead Channel Pacing Threshold Amplitude: 0.75 V
Lead Channel Pacing Threshold Amplitude: 2.25 V
Lead Channel Pacing Threshold Pulse Width: 0.5 ms
Lead Channel Pacing Threshold Pulse Width: 0.5 ms
Lead Channel Pacing Threshold Pulse Width: 0.6 ms
Lead Channel Sensing Intrinsic Amplitude: 11.8 mV
Lead Channel Sensing Intrinsic Amplitude: 3.1 mV
Lead Channel Setting Pacing Amplitude: 1.625
Lead Channel Setting Pacing Amplitude: 2 V
Lead Channel Setting Pacing Amplitude: 2.75 V
Lead Channel Setting Pacing Pulse Width: 0.5 ms
Lead Channel Setting Pacing Pulse Width: 0.6 ms
Lead Channel Setting Sensing Sensitivity: 0.5 mV
Pulse Gen Serial Number: 810030647

## 2021-10-31 ENCOUNTER — Telehealth: Payer: Self-pay

## 2021-10-31 NOTE — Progress Notes (Signed)
Per [employer] , missing information is OOP medications expense for all medications .   Charlene Brooke, CPP notified  Avel Sensor, [place]  [phone]

## 2021-11-01 NOTE — Telephone Encounter (Signed)
Re-faxed OOP costs to [place].

## 2021-11-02 DIAGNOSIS — J84112 Idiopathic pulmonary fibrosis: Secondary | ICD-10-CM | POA: Diagnosis not present

## 2021-11-02 DIAGNOSIS — Z7984 Long term (current) use of oral hypoglycemic drugs: Secondary | ICD-10-CM | POA: Diagnosis not present

## 2021-11-02 DIAGNOSIS — I2510 Atherosclerotic heart disease of native coronary artery without angina pectoris: Secondary | ICD-10-CM

## 2021-11-02 DIAGNOSIS — I5040 Unspecified combined systolic (congestive) and diastolic (congestive) heart failure: Secondary | ICD-10-CM | POA: Diagnosis not present

## 2021-11-02 DIAGNOSIS — E1159 Type 2 diabetes mellitus with other circulatory complications: Secondary | ICD-10-CM

## 2021-11-02 DIAGNOSIS — I110 Hypertensive heart disease with heart failure: Secondary | ICD-10-CM

## 2021-11-02 DIAGNOSIS — E785 Hyperlipidemia, unspecified: Secondary | ICD-10-CM | POA: Diagnosis not present

## 2021-11-02 NOTE — Progress Notes (Signed)
Patient informed. 

## 2021-11-05 NOTE — Progress Notes (Signed)
Remote ICD transmission.   

## 2021-11-08 ENCOUNTER — Telehealth: Payer: Self-pay | Admitting: Internal Medicine

## 2021-11-08 DIAGNOSIS — J84112 Idiopathic pulmonary fibrosis: Secondary | ICD-10-CM

## 2021-11-08 NOTE — Telephone Encounter (Signed)
Last HRCT was May 2022 for IPF. In that he had 13m nodules that require repeat CT in 1 year. Pleas get a HRCT supine and prone over next few weeks. PCP DTonia Ghent MD and I discussed and I said I would take over ordering the CT

## 2021-11-08 NOTE — Telephone Encounter (Signed)
Called and spoke with pt letting him know the info per MR after speaking with PCP and he verbalized understanding. Order for HRCT has been placed. Nothing further needed.

## 2021-11-16 ENCOUNTER — Encounter: Payer: Medicare Other | Admitting: Internal Medicine

## 2021-11-20 ENCOUNTER — Other Ambulatory Visit: Payer: Self-pay | Admitting: Cardiology

## 2021-11-27 ENCOUNTER — Telehealth: Payer: Self-pay

## 2021-11-27 NOTE — Progress Notes (Signed)
Called [employer] for follow up on Incruse patient assistance application that was submitted.  Patient has been approved through 05/05/2022. Medication will be delivered in the next 5 - 10 business days; will be delivered to patient's home. I called patient to inform him; no answer; left message.   Charlene Brooke, CPP notified  Marijean Niemann, [place] Clinical Pharmacy Assistant [phone]

## 2021-12-04 IMAGING — CT CT CHEST HIGH RESOLUTION W/O CM
1 of 4 series · 13 of 32 positions shown, 17 images · non-contrast
Comparison: 06/28/2020 chest radiograph.

CLINICAL DATA: Abnormal chest radiograph concerning for
interstitial lung disease. Former smoker.

EXAM:
CT CHEST WITHOUT CONTRAST
TECHNIQUE: Multidetector CT imaging of the chest was performed following the
standard protocol without intravenous contrast. High resolution
imaging of the lungs, as well as inspiratory and expiratory imaging,
was performed.

[Series 2: chest · axial · 0.81mm/px · z∈[-307,+19]mm · 13 of 183 slices shown, 17 images]
[im 10/183  mediastinal]
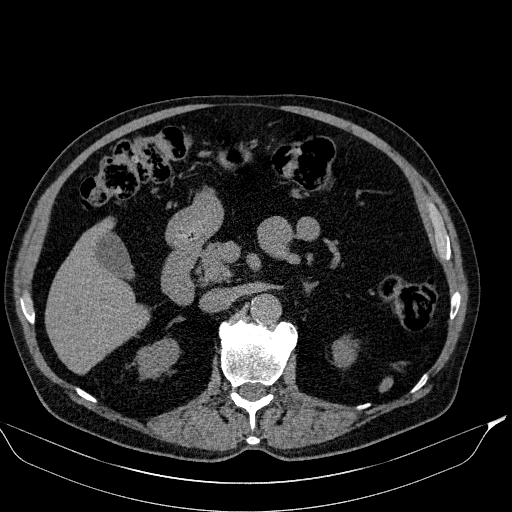
[im 10/183  lung]
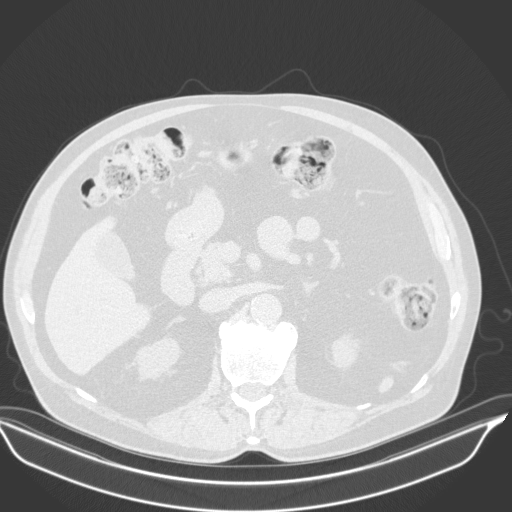
[im 29/183  lung]
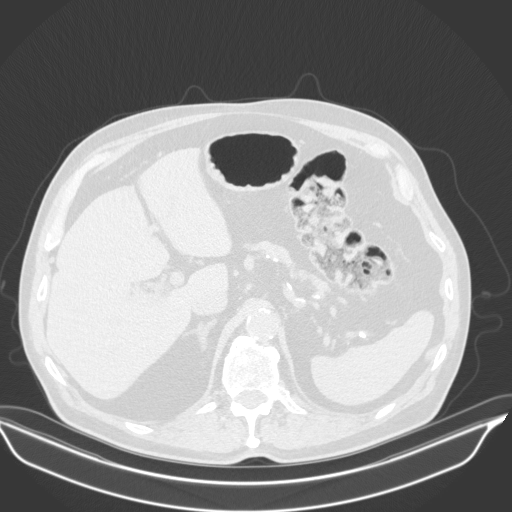
[im 39/183  lung]
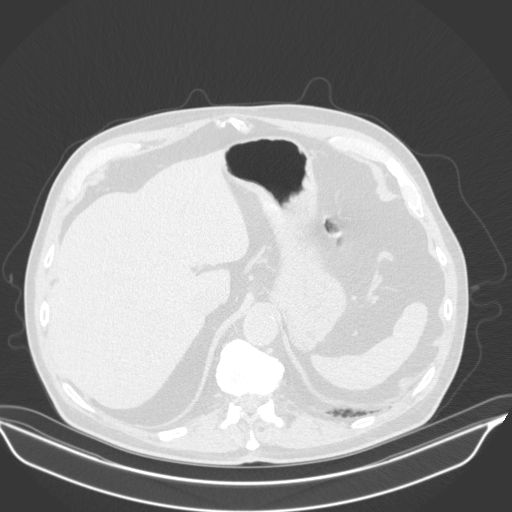
[im 58/183  lung]
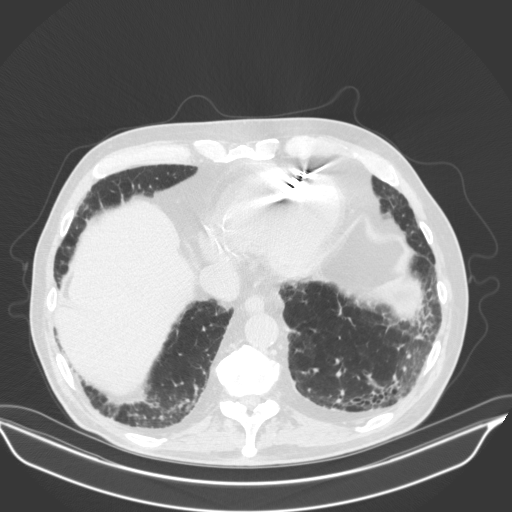
[im 68/183  mediastinal]
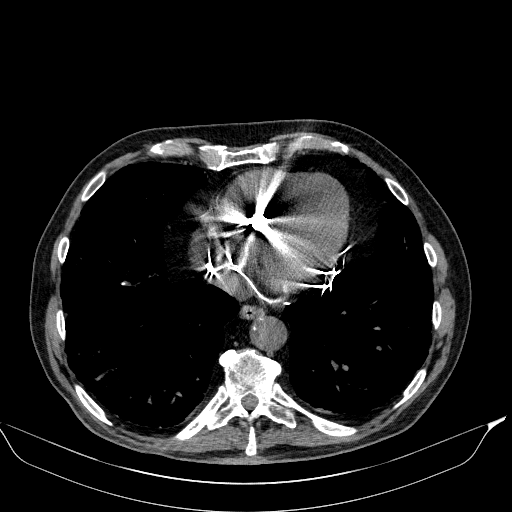
[im 68/183  lung]
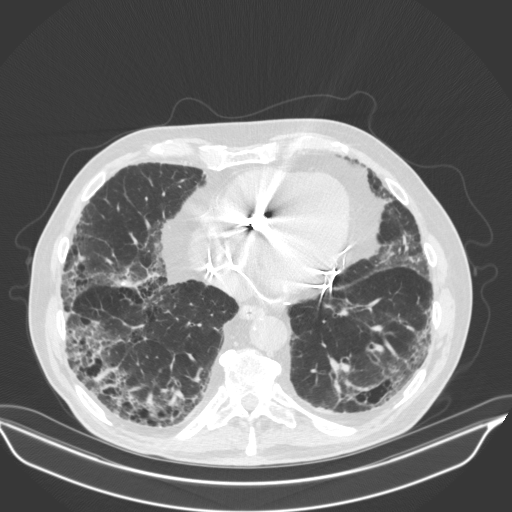
[im 87/183  lung]
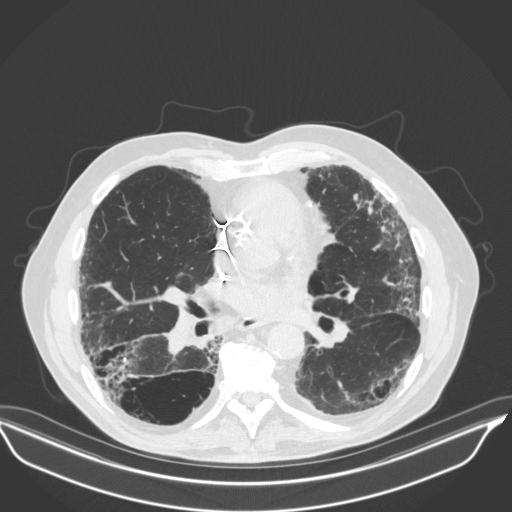
[im 92/183  lung]
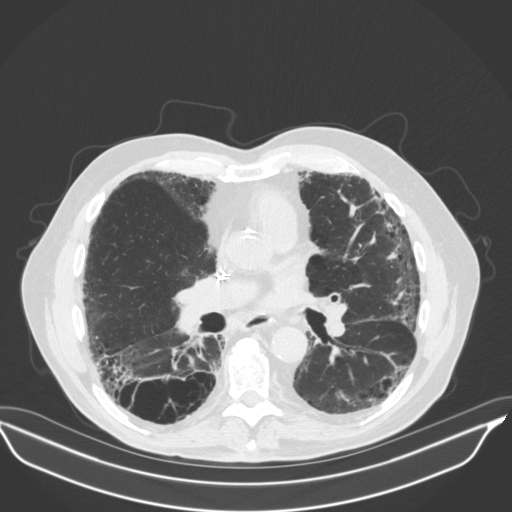
[im 96/183  lung]
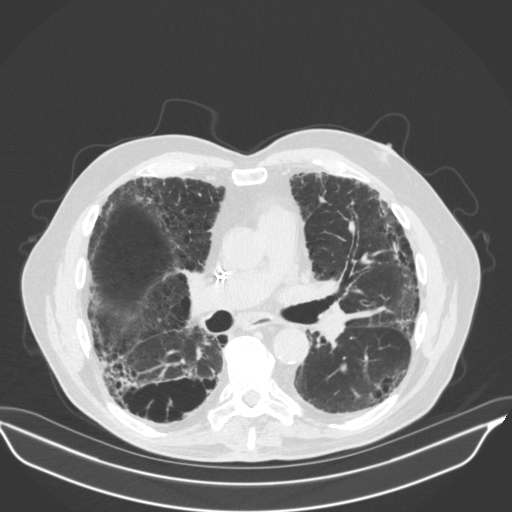
[im 115/183  mediastinal]
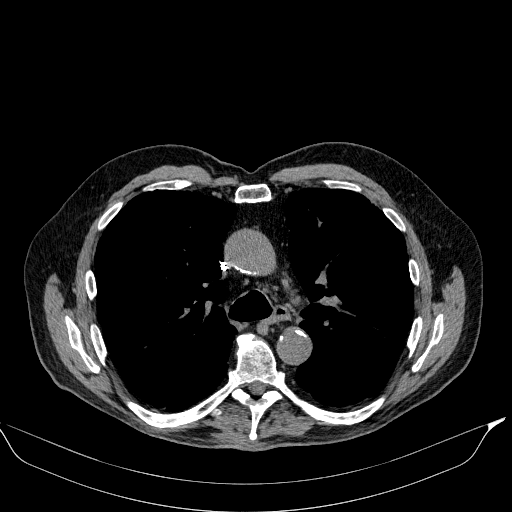
[im 115/183  lung]
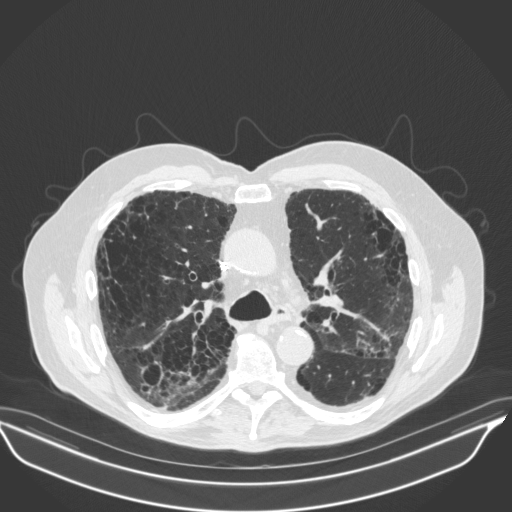
[im 125/183  lung]
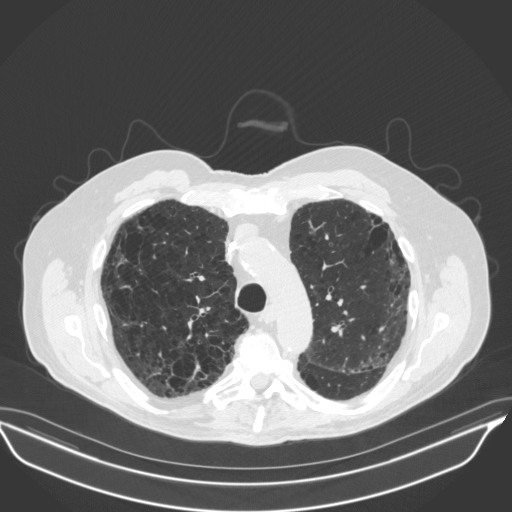
[im 144/183  lung]
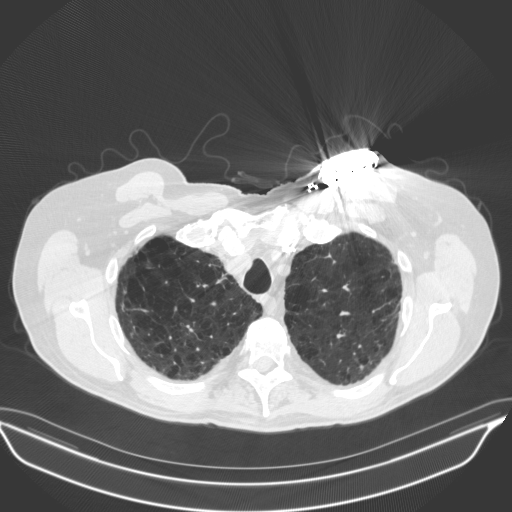
[im 154/183  lung]
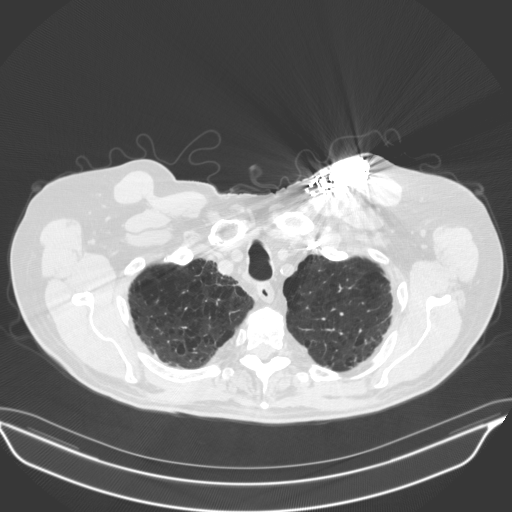
[im 173/183  mediastinal]
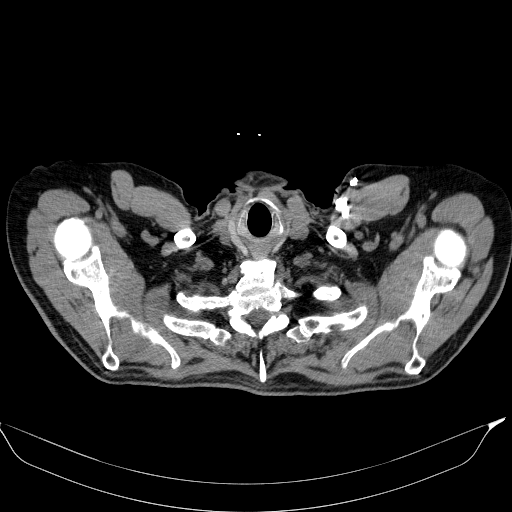
[im 173/183  lung]
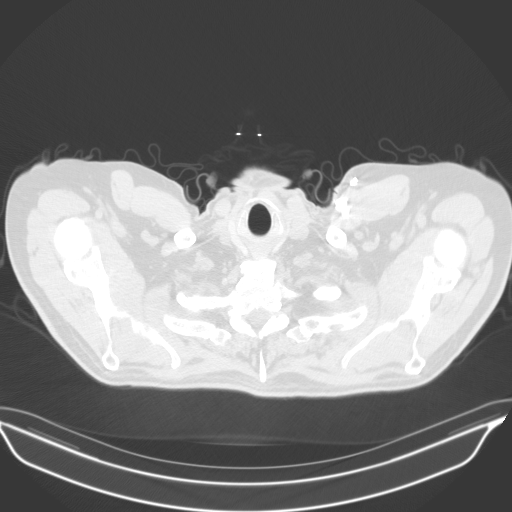

[13 of 32 positions shown; findings below may reference images not displayed]

FINDINGS: Cardiovascular: Normal heart size. No significant pericardial
effusion/thickening. Three lead left subclavian ICD with lead tips
in the right ventricular apex, right atrium and coronary sinus.
Three-vessel coronary atherosclerosis. Atherosclerotic nonaneurysmal
thoracic aorta. Normal caliber pulmonary arteries.

Mediastinum/Nodes: No discrete thyroid nodules. Unremarkable
esophagus. No pathologically enlarged axillary, mediastinal or hilar
lymph nodes, noting limited sensitivity for the detection of hilar
adenopathy on this noncontrast study.

Lungs/Pleura: No pneumothorax. No pleural effusion. Severe
centrilobular and paraseptal emphysema with mild diffuse bronchial
wall thickening. No acute consolidative airspace disease or lung
masses. A few scattered small solid pulmonary nodules in both lungs,
largest 5 mm in the superior segment right lower lobe (series
7/image 78). Few scattered tiny calcified granulomas in both lungs.
Moderate patchy confluent subpleural and perilobular reticulation
and ground-glass opacity in both lungs with associated mild traction
bronchiectasis, architectural distortion and volume loss. There is a
basilar gradient to these findings. No frank honeycombing
(subpleural cystic changes in the dependent lower lobes are favored
to represent paraseptal emphysema). Findings have clearly progressed
at the lung bases since 10/07/2011 CT abdomen study with mild
progression since 03/25/2018 CT abdomen study.

Upper abdomen: Subcentimeter hypodense left liver lesion is too
small to characterize and requires no follow-up unless the patient
has risk factors for liver malignancy. Chronic atrophy of pancreatic
body and tail, progressive since 2283 CT abdomen study, without
discrete mass on this noncontrast CT.

Musculoskeletal: No aggressive appearing focal osseous lesions.
Moderate thoracic spondylosis.
IMPRESSION: 1. Spectrum of findings compatible with basilar predominant fibrotic
interstitial lung disease without frank honeycombing, with mild
progression at the lung bases since 03/25/2018 CT abdomen study,
with more clear progression since 10/07/2011 CT abdomen study.
Findings are categorized as probable UIP per consensus guidelines:
Diagnosis of Idiopathic Pulmonary Fibrosis: An Official
ATS/ERS/JRS/ALAT Clinical Practice Guideline. Am J Respir Crit Care
Med Vol 198, [place] 5, ppe44-e[DATE].
2. Scattered small solid pulmonary nodules, largest 5 mm. Follow-up
noncontrast chest CT recommended in 12 months. This recommendation
follows the consensus statement: Guidelines for Management of
Incidental Pulmonary Nodules Detected on CT Images: From the
3. Three-vessel coronary atherosclerosis.
4. Chronic atrophy of pancreatic body and tail, progressive since
2283 CT abdomen study, without discrete mass on this noncontrast CT,
indeterminate but presumably due to progressive chronic
pancreatitis.
5. Aortic Atherosclerosis (A61QB-3T0.0) and Emphysema (A61QB-TES.S).

## 2021-12-06 ENCOUNTER — Ambulatory Visit
Admission: RE | Admit: 2021-12-06 | Discharge: 2021-12-06 | Disposition: A | Discharge status: Home / Self Care | Payer: Medicare Other | Source: Ambulatory Visit | Attending: Internal Medicine | Admitting: Internal Medicine

## 2021-12-06 DIAGNOSIS — J432 Centrilobular emphysema: Secondary | ICD-10-CM | POA: Diagnosis not present

## 2021-12-06 DIAGNOSIS — J479 Bronchiectasis, uncomplicated: Secondary | ICD-10-CM | POA: Diagnosis not present

## 2021-12-06 DIAGNOSIS — J84112 Idiopathic pulmonary fibrosis: Secondary | ICD-10-CM | POA: Diagnosis not present

## 2021-12-06 DIAGNOSIS — I700 Atherosclerosis of aorta: Secondary | ICD-10-CM | POA: Diagnosis not present

## 2021-12-13 ENCOUNTER — Encounter: Payer: Medicare Other | Admitting: Internal Medicine

## 2021-12-13 NOTE — Progress Notes (Signed)
No change in fibrosis but there are some new nodules that require CT I 3 months. Will discuss in sept 2023 office vist. The nodules arelikely benign  xxxxxxxxxxxxxxxxxx IMPRESSION: 1. Basilar and subpleural predominant fibrotic interstitial lung disease with traction bronchiectasis and honeycomb change. No evidence of progression. Findings are consistent with UIP per consensus guidelines: Diagnosis of Idiopathic Pulmonary Fibrosis: An Official ATS/ERS/JRS/ALAT Clinical Practice Guideline. [place], Iss 5, [phone], Jan 04 2017. 2. New clustered part solid pulmonary nodules of the right lower lobe, likely due to infection or aspiration. Recommend follow-up chest CT in 3 months to ensure resolution.

## 2021-12-15 ENCOUNTER — Other Ambulatory Visit: Payer: Self-pay | Admitting: Cardiology

## 2021-12-17 ENCOUNTER — Ambulatory Visit (INDEPENDENT_AMBULATORY_CARE_PROVIDER_SITE_OTHER): Payer: Medicare Other | Admitting: Internal Medicine

## 2021-12-17 ENCOUNTER — Encounter: Payer: Self-pay | Admitting: Internal Medicine

## 2021-12-17 VITALS — BP 130/66 | HR 70 | Ht 74.0 in | Wt 186.4 lb

## 2021-12-17 DIAGNOSIS — I2510 Atherosclerotic heart disease of native coronary artery without angina pectoris: Secondary | ICD-10-CM | POA: Diagnosis not present

## 2021-12-17 DIAGNOSIS — I447 Left bundle-branch block, unspecified: Secondary | ICD-10-CM

## 2021-12-17 DIAGNOSIS — I5022 Chronic systolic (congestive) heart failure: Secondary | ICD-10-CM | POA: Diagnosis not present

## 2021-12-17 DIAGNOSIS — I442 Atrioventricular block, complete: Secondary | ICD-10-CM

## 2021-12-17 DIAGNOSIS — Z95810 Presence of automatic (implantable) cardiac defibrillator: Secondary | ICD-10-CM

## 2021-12-17 LAB — CUP PACEART INCLINIC DEVICE CHECK
Battery Remaining Longevity: 84 mo
Brady Statistic RA Percent Paced: 17 %
Brady Statistic RV Percent Paced: 94 %
Date Time Interrogation Session: 20230814172746
HighPow Impedance: 75.375
Implantable Lead Implant Date: 20140313
Implantable Lead Implant Date: 20140313
Implantable Lead Implant Date: 20140313
Implantable Lead Location: 753858
Implantable Lead Location: 753859
Implantable Lead Location: 753860
Implantable Pulse Generator Implant Date: 20220622
Lead Channel Impedance Value: 387.5 Ohm
Lead Channel Impedance Value: 462.5 Ohm
Lead Channel Impedance Value: 787.5 Ohm
Lead Channel Pacing Threshold Amplitude: 0.5 V
Lead Channel Pacing Threshold Amplitude: 0.5 V
Lead Channel Pacing Threshold Amplitude: 0.75 V
Lead Channel Pacing Threshold Amplitude: 0.75 V
Lead Channel Pacing Threshold Amplitude: 1.5 V
Lead Channel Pacing Threshold Amplitude: 1.5 V
Lead Channel Pacing Threshold Pulse Width: 0.5 ms
Lead Channel Pacing Threshold Pulse Width: 0.5 ms
Lead Channel Pacing Threshold Pulse Width: 0.5 ms
Lead Channel Pacing Threshold Pulse Width: 0.5 ms
Lead Channel Pacing Threshold Pulse Width: 0.6 ms
Lead Channel Pacing Threshold Pulse Width: 0.6 ms
Lead Channel Sensing Intrinsic Amplitude: 3.7 mV
Lead Channel Setting Pacing Amplitude: 1.625
Lead Channel Setting Pacing Amplitude: 2 V
Lead Channel Setting Pacing Amplitude: 2.125
Lead Channel Setting Pacing Pulse Width: 0.5 ms
Lead Channel Setting Pacing Pulse Width: 0.6 ms
Lead Channel Setting Sensing Sensitivity: 0.5 mV
Pulse Gen Serial Number: 810030647

## 2021-12-17 NOTE — Patient Instructions (Signed)
Medication Instructions:  Your physician has recommended you make the following change in your medication:   ** Hydralazine has been removed from your medication list  Take Valsartan at night  *If you need a refill on your cardiac medications before your next appointment, please call your pharmacy*   Lab Work: None ordered.  If you have labs (blood work) drawn today and your tests are completely normal, you will receive your results only by: [place] (if you have MyChart) OR A paper copy in the mail If you have any lab test that is abnormal or we need to change your treatment, we will call you to review the results.   Testing/Procedures: None ordered.    Follow-Up: At [facility], you and your health needs are our priority.  As part of our continuing mission to provide you with exceptional heart care, we have created designated Provider Care Teams.  These Care Teams include your primary Cardiologist (physician) and Advanced Practice Providers (APPs -  Physician Assistants and Nurse Practitioners) who all work together to provide you with the care you need, when you need it.  We recommend signing up for the patient portal called "MyChart".  Sign up information is provided on this After Visit Summary.  MyChart is used to connect with patients for Virtual Visits (Telemedicine).  Patients are able to view lab/test results, encounter notes, upcoming appointments, etc.  Non-urgent messages can be sent to your provider as well.   To learn more about what you can do with MyChart, go to [email or web address].    Your next appointment:   12 months with Dr Caryl Comes  Important Information About Sugar

## 2021-12-17 NOTE — Progress Notes (Signed)
Patient Care Team: Tonia Ghent, MD as PCP - General Marlou Porch Thana Farr, MD as PCP - Cardiology (Cardiology) Angelia Mould, MD (Vascular Surgery) Arta Silence, MD (Gastroenterology) Michael Boston, MD (General Surgery) Eustace Moore, MD (Neurosurgery) Idolina Primer Warnell Bureau (Optometry) Jerline Pain, MD (Cardiology) Charlton Haws, [facility] as Pharmacist (Pharmacist)   HPI  Luke [name] is a 76 y.o. male Seen in followup for an CRT-  ICD St Jude  implanted for intermittent complete heart block in the setting of ischemic cardiomyopathy and left bundle branch block.  GEN change 6/22  Some diaphragmatic stimulation prior to GEN change, not associated with macro dislodgment.  LHC 2013  2V CAD Left anterior descending (LAD): focal 70-80% proximal. Long 40% mid vessel. Right coronary artery (RCA): Diffusely diseased. 50-60% mid vessel, 70% distal, 70% at bifurcation of PDA/PLOM.   History of pulmonary fibrosis.  Also AAA status post endovascular repair  Significant LH in the afternoon that he has been able to obviate by stopping isosorbide and valsartan.       DATE TEST EF   6/13 LHC  [id number]; RCA diff 50-60%  3/14 Echo   30-35 %   3/19 Echo  55-65%   3/23 Echo  40-45 %   3/23  LHC   LADp 80% RCA T with L>>R collaterals     Date Cr K Hgb  10/20 1.17 4.5 14.3   10/21 1.24 4.9 14.3  6/22 1.17 4.8 15.3  5/23 1.18 5.0 13.3<16.9      Past Medical History:  Diagnosis Date   AAA (abdominal aortic aneurysm) ([place]) 2011   Per vascular surgery   Anxiety    no med. in use for anxiety but pt. speaks openly of his stress & anxious feelings regarding impending surgery     Atrial fibrillation ([place])    Automatic implantable cardioverter-defibrillator in situ    CAD (coronary artery disease)    Presumed CAD with nuclear scan October 09, 2011,  large anteroseptal MI and inferior MI. Catheterization scheduled October 15, 2011   Cardiomyopathy [facility])    Nuclear, October 09, 2011,  EF 30%, multiple focal wall motion abnormalities   Diabetes mellitus    type II   Drug therapy    Hyperkalemia with spironolactone   Ejection fraction < 50%    EF 30%, nuclear, October 09, 2011   Ejection fraction < 50%    EF previously 30%  //   EF 30-35%, echo, July 14, 2012, severe diffuse hypokinesis, PA pressure 43 mm mercury   Fall due to ice or snow Feb.  19, 2015   HLD (hyperlipidemia)    Hypertension    white coat HTN-- often elevated in office and controlled on outside checks.   ICD (implantable cardioverter-defibrillator) in place    CRT-D placed March, 2014 complete heart block and k dysfunction   LBBB (left bundle branch block)    LBBB on EKG October 11, 2011,  no prior EKG has been done   Low testosterone    Hx of   Myocardial infarction ([place])    Told in 2015-never knew-showed up on stress test for AAA;    Pacemaker    PAD (peripheral artery disease) ([place])    Pancreatitis     Past Surgical History:  Procedure Laterality Date   ABDOMINAL AORTAGRAM N/A 10/28/2011   Procedure: ABDOMINAL [name];  Surgeon: Angelia Mould, MD;  Location[facility] C[facility] LAB;  Service: Cardiovascular;  Laterality: N/A;  ABDOMINAL AORTIC ANEURYSM REPAIR     EVAR    BI-VENTRICULAR IMPLANTABLE CARDIOVERTER DEFIBRILLATOR N/A 07/16/2012   Procedure: BI-VENTRICULAR IMPLANTABLE CARDIOVERTER DEFIBRILLATOR  (CRT-D);  Surgeon: Deboraha Sprang, MD;  Location: [facility] C[facility] LAB;  Service: Cardiovascular;  Laterality: N/A;   BIV ICD GENERATOR CHANGEOUT N/A 10/25/2020   Procedure: BIV ICD GENERATOR CHANGEOUT;  Surgeon: Deboraha Sprang, MD;  Location: [place] CV LAB;  Service: Cardiovascular;  Laterality: N/A;   CARDIAC CATHETERIZATION     COLONOSCOPY  03/23/2018; 2020   ESOPHAGOGASTRODUODENOSCOPY N/A 02/12/2019   Procedure: ESOPHAGOGASTRODUODENOSCOPY (EGD);  Surgeon: Jackquline Denmark, MD;  Location: [facility] ENDOSCOPY;  Service: Endoscopy;  Laterality: N/A;   ESOPHAGOGASTRODUODENOSCOPY (EGD) WITH PROPOFOL N/A 02/05/2018    Procedure: ESOPHAGOGASTRODUODENOSCOPY (EGD) WITH PROPOFOL;  Surgeon: Mauri Pole, MD;  Location: [facility] ENDOSCOPY;  Service: Endoscopy;  Laterality: N/A;   ESOPHAGOGASTRODUODENOSCOPY (EGD) WITH PROPOFOL N/A 09/27/2018   Procedure: ESOPHAGOGASTRODUODENOSCOPY (EGD) WITH PROPOFOL;  Surgeon: Carol Ada, MD;  Location: [place];  Service: Endoscopy;  Laterality: N/A;   FOREIGN BODY REMOVAL  09/27/2018   Procedure: FOREIGN BODY REMOVAL;  Surgeon: Carol Ada, MD;  Location: [facility] ENDOSCOPY;  Service: Endoscopy;;   FOREIGN BODY REMOVAL  02/12/2019   Procedure: FOREIGN BODY REMOVAL;  Surgeon: Jackquline Denmark, MD;  Location: [facility] ENDOSCOPY;  Service: Endoscopy;;   HERNIA REPAIR  Jan. 9, 2015   INSERTION OF MESH N/A 05/14/2013   Procedure: INSERTION OF MESH;  Surgeon: Adin Hector, MD;  Location: [place];  Service: General;  Laterality: N/A;   LEFT HEART CATH AND CORONARY ANGIOGRAPHY N/A 07/17/2021   Procedure: LEFT HEART CATH AND CORONARY ANGIOGRAPHY;  Surgeon: Troy Sine, MD;  Location: [place] CV LAB;  Service: Cardiovascular;  Laterality: N/A;   LOWER EXTREMITY ANGIOGRAM Bilateral 10/28/2011   Procedure: LOWER EXTREMITY ANGIOGRAM;  Surgeon: Angelia Mould, MD;  Location: [facility] CATH LAB;  Service: Cardiovascular;  Laterality: Bilateral;   PACEMAKER INSERTION  07-16-12   pacemaker/defibrilator   POLYPECTOMY     POSTERIOR CERVICAL FUSION/FORAMINOTOMY N/A 06/09/2014   Procedure: Laminectomy - Cervical two-Cervcial four posterior cervical instrumented fusion Cervical two-cervical four;  Surgeon: Eustace Moore, MD;  Location: [place] NEURO ORS;  Service: Neurosurgery;  Laterality: N/A;  posterior    SAVORY DILATION N/A 02/05/2018   Procedure: SAVORY DILATION;  Surgeon: Mauri Pole, MD;  Location: [facility] ENDOSCOPY;  Service: Endoscopy;  Laterality: N/A;   TONSILLECTOMY     as a child    UMBILICAL HERNIA REPAIR N/A 05/14/2013   Procedure: LAPAROSCOPIC exploration and repair of hernia in abdominal  ;  Surgeon: Adin Hector, MD;  Location: [place];  Service: General;  Laterality: N/A;   UPPER GASTROINTESTINAL ENDOSCOPY  2020    Current Outpatient Medications  Medication Sig Dispense Refill   albuterol (VENTOLIN HFA) 108 (90 Base) MCG/ACT inhaler Inhale 2 puffs into the lungs every 6 (six) hours as needed for wheezing or shortness of breath. 8 g 6   amLODipine (NORVASC) 10 MG tablet TAKE 1 TABLET(10 MG) BY MOUTH DAILY 90 tablet 3   aspirin EC 81 MG tablet Take 81 mg by mouth every evening.     carvedilol (COREG) 25 MG tablet TAKE 1 TABLET BY MOUTH TWICE DAILY WITH MEALS 180 tablet 3   Cholecalciferol (VITAMIN D3) 10 MCG (400 UNIT) CAPS Take 1 capsule by mouth daily.     glipiZIDE (GLUCOTROL) 5 MG tablet TAKE 1 TABLET(5 MG) BY MOUTH TWICE DAILY BEFORE A MEAL 180 tablet 1  glucose blood (ACCU-CHEK AVIVA PLUS) test strip AS DIRECTED BLOOD GLUCOSE TWICE DAILY 200 strip 3   hydrALAZINE (APRESOLINE) 25 MG tablet Take 1 tablet (25 mg total) by mouth 3 (three) times daily.     insulin glargine (LANTUS SOLOSTAR) 100 UNIT/ML Solostar Pen INJECT 0.3 TO 0.35 MLS(30 TO 35 UNITS) INTO THE SKIN EVERY DAY 15 mL 3   isosorbide mononitrate (IMDUR) 30 MG 24 hr tablet Take 2 tablets (60 mg total) by mouth daily. 180 tablet 3   metFORMIN (GLUCOPHAGE) 500 MG tablet TAKE 2 TABLETS BY MOUTH TWICE DAILY WITH FOOD 360 tablet 3   Multiple Vitamin (MULTIVITAMIN WITH MINERALS) TABS tablet Take 1 tablet by mouth every evening.     omeprazole (PRILOSEC) 40 MG capsule TAKE 1 CAPSULE(40 MG) BY MOUTH TWICE DAILY 180 capsule 0   simvastatin (ZOCOR) 40 MG tablet Take 1 tablet (40 mg total) by mouth at bedtime. Has tolerated even with amlodipine use.  He didn't tolerate atorvastatin. 90 tablet 3   umeclidinium bromide (INCRUSE ELLIPTA) 62.5 MCG/ACT AEPB Inhale 1 puff into the lungs daily. 90 each 3   valsartan (DIOVAN) 40 MG tablet Take 1 tablet (40 mg total) by mouth daily. 30 tablet 11   vitamin B-12 (CYANOCOBALAMIN) 1000  MCG tablet Take 1,000 mcg by mouth in the morning.     No current facility-administered medications for this visit.    Allergies  Allergen Reactions   Aldactone [Spironolactone] Other (See Comments)    Hyperkalemia   Codeine Rash and Hives   Atorvastatin     Myalgias with lipitor.  Does tolerate simvastatin.     Januvia [Sitagliptin] Other (See Comments)    Diarrhea and heart racing   Jardiance [Empagliflozin] Other (See Comments)    Polyuria; excessive weight loss   Lisinopril     Possible cause of pancreatitis    Review of Systems negative except from HPI and PMH  Physical Exam BP 130/66   Pulse 70   Ht '6\' 2"'$  (1.88 m)   Wt 186 lb 6.4 oz (84.6 kg)   SpO2 95%   BMI 23.93 kg/m  Well developed and well nourished in no acute distress HENT normal Neck supple with JVP-flat Clear Device pocket well healed; without hematoma or erythema.  There is no tethering  Regular rate and rhythm, no  gallop No  murmur Abd-soft with an  No Clubbing cyanosis  edema Skin-warm and dry A & Oriented  Grossly normal sensory and motor function  ECG upright QRS lead V1 and negative QRS lead I active BS   Assessment Plan  Ischemic Cardiomyopathy interval normalization  Chronic HF diastolic  Implantable Defibrillator CRT  St Jude  .     Complete heart Block   Hyperlipidemia  Diabetes  Hypertension   Blood pressure is currently well controlled.  I wonder with his LV dysfunction with her he might not benefit from [facility], he has a little bit of edema and could get him off the amlodipine and his valsartan.  He could also benefit potentially from an SGLT2 as well as spironolactone  His lightheadedness in the afternoon may be a consequence of his drugs in the afternoon although his measured blood pressures are not all that low when he is sitting; however, the symptoms attenuated with his holding of these medications, we will have him take his valsartan at night.  He is mildly volume  overloaded; however, his neck veins are flat I wonder if it is the amlodipine.  We reviewed the  pictures on his catheterization and described collateral flow  Device function is normal

## 2021-12-18 NOTE — Addendum Note (Signed)
Addended by: Michelle Nasuti on: 12/18/2021 08:13 AM   Modules accepted: Orders

## 2021-12-27 DIAGNOSIS — H2511 Age-related nuclear cataract, right eye: Secondary | ICD-10-CM | POA: Diagnosis not present

## 2022-01-08 ENCOUNTER — Other Ambulatory Visit (INDEPENDENT_AMBULATORY_CARE_PROVIDER_SITE_OTHER): Payer: Medicare Other

## 2022-01-08 DIAGNOSIS — J84112 Idiopathic pulmonary fibrosis: Secondary | ICD-10-CM

## 2022-01-08 DIAGNOSIS — Z5181 Encounter for therapeutic drug level monitoring: Secondary | ICD-10-CM

## 2022-01-08 LAB — HEPATIC FUNCTION PANEL
ALT: 13 U/L (ref 0–53)
AST: 15 U/L (ref 0–37)
Albumin: 4 g/dL (ref 3.5–5.2)
Alkaline Phosphatase: 67 U/L (ref 39–117)
Bilirubin, Direct: 0.1 mg/dL (ref 0.0–0.3)
Total Bilirubin: 0.5 mg/dL (ref 0.2–1.2)
Total Protein: 7 g/dL (ref 6.0–8.3)

## 2022-01-08 LAB — COMPREHENSIVE METABOLIC PANEL
ALT: 13 U/L (ref 0–53)
AST: 15 U/L (ref 0–37)
Albumin: 4 g/dL (ref 3.5–5.2)
Alkaline Phosphatase: 67 U/L (ref 39–117)
BUN: 22 mg/dL (ref 6–23)
CO2: 25 mEq/L (ref 19–32)
Calcium: 9.5 mg/dL (ref 8.4–10.5)
Chloride: 101 mEq/L (ref 96–112)
Creatinine, Ser: 1.23 mg/dL (ref 0.40–1.50)
GFR: 57.53 mL/min — ABNORMAL LOW (ref >60.00)
Glucose, Bld: 163 mg/dL — ABNORMAL HIGH (ref 70–99)
Potassium: 4.8 mEq/L (ref 3.5–5.1)
Sodium: 136 mEq/L (ref 135–145)
Total Bilirubin: 0.5 mg/dL (ref 0.2–1.2)
Total Protein: 7 g/dL (ref 6.0–8.3)

## 2022-01-10 ENCOUNTER — Ambulatory Visit (INDEPENDENT_AMBULATORY_CARE_PROVIDER_SITE_OTHER): Payer: Medicare Other | Admitting: Internal Medicine

## 2022-01-10 ENCOUNTER — Encounter: Payer: Self-pay | Admitting: Internal Medicine

## 2022-01-10 VITALS — BP 134/72 | HR 66 | Ht 72.0 in | Wt 187.2 lb

## 2022-01-10 DIAGNOSIS — J432 Centrilobular emphysema: Secondary | ICD-10-CM | POA: Diagnosis not present

## 2022-01-10 DIAGNOSIS — Z5181 Encounter for therapeutic drug level monitoring: Secondary | ICD-10-CM

## 2022-01-10 DIAGNOSIS — I2510 Atherosclerotic heart disease of native coronary artery without angina pectoris: Secondary | ICD-10-CM

## 2022-01-10 DIAGNOSIS — J84112 Idiopathic pulmonary fibrosis: Secondary | ICD-10-CM

## 2022-01-10 LAB — PULMONARY FUNCTION TEST
DL/VA % pred: 67 %
DL/VA: 2.64 ml/min/mmHg/L
DLCO cor % pred: 44 %
DLCO cor: 11.84 ml/min/mmHg
DLCO unc % pred: 44 %
DLCO unc: 11.84 ml/min/mmHg
FEF 25-75 Pre: 1.59 L/sec
FEF2575-%Pred-Pre: 65 %
FEV1-%Pred-Pre: 77 %
FEV1-Pre: 2.56 L
FEV1FVC-%Pred-Pre: 94 %
FEV6-%Pred-Pre: 86 %
FEV6-Pre: 3.71 L
FEV6FVC-%Pred-Pre: 105 %
FVC-%Pred-Pre: 81 %
FVC-Pre: 3.73 L
Pre FEV1/FVC ratio: 69 %
Pre FEV6/FVC Ratio: 99 %

## 2022-01-10 NOTE — Patient Instructions (Addendum)
ICD-10-CM   1. IPF (idiopathic pulmonary fibrosis) (HCC)  J84.112 Hepatic function panel    Hepatic function panel    2. Centrilobular emphysema ([place])  J43.2     3. 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[place])  J43.9    J84.10       Re IPF  - sstable on PFT and CT > 1 year +  Plan - cotninue esbriet  at 2 pill thress times daily; take with food - clinical trial consideration in future - consider TETON study with inh - do spirom/dlco n 3 months - RSV, Flu and covid vaccines in fall 2023   Emphysema -   Stable  Plan -continue spiriva respimat 2 puff daily  - continue  albuterol prn  Lung nodules right - cluster - new Aug 2023  Plan  - Do CT chest without contrast at  3 month point - early Nov 2023 which is in 8 weeks from now  Followup -  - will call with CT results in 8 weeks - Henrique Parekh 30 min visit after spiro/dlco in 12 weeks but after PFT in 12 weeks   - symptom score and walk test at followup

## 2022-01-10 NOTE — Patient Instructions (Signed)
Spirometry and DLCO Performed Today.  

## 2022-01-10 NOTE — Progress Notes (Signed)
OV 01/02/2021  Subjective:  Patient ID: Luke [name], male , DOB: [birth year] , age 76 y.o. , MRN: [id number] , ADDRESS: [place] [id number] PCP Tonia Ghent, MD Patient Care Team: Tonia Ghent, MD as PCP - General Angelia Mould, MD (Vascular Surgery) Arta Silence, MD (Gastroenterology) Michael Boston, MD (General Surgery) Eustace Moore, MD (Neurosurgery) Juluis Rainier (Optometry) Jerline Pain, MD (Cardiology) [name], [facility] as Pharmacist (Pharmacist)  This Provider for this visit: Treatment Team:  Attending Provider: Brand Males, MD    01/02/2021 -transfer of care to the interstitial lung disease center for Dr. Chase Caller.  Referral from Dr. June Leap Chief Complaint  Patient presents with   Consult    ILD consult per Dr. Valeta Harms. Pt states he has been doing okay since last visit and denies any complaints.     HPI Luke [name] 76 y.o. -referred by Dr. June Leap for interstitial lung disease evaluation    Integrated Comprehensive ILD Questionnaire  Symptoms:  -Reports insidious onset of shortness of breath gradually.  It is the same since it started.  He is unclear how long he is headed but he had it for a few years.  There is no cough at all.  There is no clearing of the throat.  There is no fatigue.  His appetite is good no nausea no vomiting no diarrhea no anxiety no depression no chronic pain    Past Medical History :   In 2009 he was diagnosed with systolic heart failure.  He believes etiology is ischemic based on a remote heart attack.  Has a history of diabetes for several years.  Has kidney disease not otherwise specified for several years.  His GFR in May 2022 was greater than 60 with a creatinine 1.23 mg percent.   His last echocardiogram was in 2019.  He sees cardiology Dr. Caryl Comes and Dr. Candee Furbish.  Overall he is deemed to be stable.  He had pacemaker check recently.  Denies any  collagen vascular disease or vasculitis sleep apnea.  Denies any PE.  He has had COVID-vaccine but has not had COVID. ROS:   -Positive for fatigue.  Has some dysphagia for the last few days but otherwise okay.  No nausea no vomiting no heartburn no snoring no rash no ulcers.   FAMILY HISTORY of LUNG DISEASE: Denies   EXPOSURE HISTORY: Smokes cigarettes 20 1966 in 2012 30 cigarettes/day.  He did have some passive smoking.  Did smoke marijuana between 1968 and 1970.  Once a month.  No cocaine use no intravenous drug use   HOME and HOBBY DETAILS : Single-family [place] the last 25 years the age of the home is 22 years.  There is no dampness.  No mildew no mold.  No humidifier use no CPAP use no nebulizer use.  No steam iron use no Jacuzzi use.  No misting [place] no pet birds or parakeets no pet gerbils.  No mold in the [facility] duct.  Last checked in 2009.  Does do gardening.  No birds at this no strong mats no water damage no Jacuzzi.   OCCUPATIONAL HISTORY (122 questions) : He was in [place] and got exposed to agent orange but otherwise detailed questioning is negative other than the fact he worked with some petroleum based cleaning agents.   PULMONARY TOXICITY HISTORY (27 items): He was on hydralazine in 2019.   HRCT May 2022 -  personally visualzied -definite progression is combined emphysema with UIP features.  IMPRESSION: 1. Spectrum of findings compatible with basilar predominant fibrotic interstitial lung disease without frank honeycombing, with mild progression at the lung bases since 03/25/2018 CT abdomen study, with more clear progression since 10/07/2011 CT abdomen study. Findings are categorized as probable UIP per consensus guidelines: Diagnosis of Idiopathic Pulmonary Fibrosis: An Official ATS/ERS/JRS/ALAT Clinical Practice Guideline. [place], Iss 5, [id number], Jan 04 2017. 2. Scattered small solid pulmonary nodules, largest 5 mm.  Follow-up noncontrast chest CT recommended in 12 months. This recommendation follows the consensus statement: Guidelines for Management of Incidental Pulmonary Nodules Detected on CT Images: From the Fleischner Society 2017; Radiology 2017; 284:228-243. 3. Three-vessel coronary atherosclerosis. 4. Chronic atrophy of pancreatic body and tail, progressive since 2019 CT abdomen study, without discrete mass on this noncontrast CT, indeterminate but presumably due to progressive chronic pancreatitis. 5. Aortic Atherosclerosis (ICD10-I70.0) and Emphysema (ICD10-J43.9).     Electronically Signed   By: Ilona Sorrel M.D.   On: 10/03/2020 16:53  SErology  Results for Luke [name]" (MRN [id number]) as of 01/02/2021 14:44  Ref. Range 11/13/2020 16:09  Anti Nuclear Antibody (ANA) Latest Ref Range: NEGATIVE  NEGATIVE  Angiotensin-Converting Enzyme Latest Ref Range: 9 - 67 U/L 27  Cyclic Citrullin Peptide Ab Latest Units: UNITS <16  ds DNA Ab Latest Units: IU/mL <1  Myeloperoxidase Abs Latest Units: AI <1.0  Serine Protease 3 Latest Units: AI <1.0  RA Latex Turbid. Latest Ref Range: <14 IU/mL <14  SSA (Ro) (ENA) Antibody, IgG Latest Ref Range: <1.0 NEG AI <1.0 NEG  SSB (La) (ENA) Antibody, IgG Latest Ref Range: <1.0 NEG AI <1.0 NEG  Scleroderma (Scl-70) (ENA) Antibody, IgG Latest Ref Range: <1.0 NEG AI <1.0 NEG    eCHO arch 2019  Study Conclusions   - Left ventricle: The cavity size was normal. Wall thickness was    normal. Systolic function was normal. The estimated ejection    fraction was in the range of 55% to 60%. Wall motion was normal;    there were no regional wall motion abnormalities. Doppler    parameters are consistent with abnormal left ventricular    relaxation (grade 1 diastolic dysfunction).  - Mitral valve: There was mild regurgitation.  - Pulmonary arteries: Systolic pressure was moderately increased.    PA peak pressure: 40 mm Hg (S).     OV 02/14/2021 -  telephine visit  Subjective:  Patient ID: Luke [name], male , DOB: 1945/06/09 , age 76 y.o. , MRN: [id number] , ADDRESS: [place] [address] PCP Tonia Ghent, MD Patient Care Team: Tonia Ghent, MD as PCP - General Angelia Mould, MD (Vascular Surgery) Arta Silence, MD (Gastroenterology) Michael Boston, MD (General Surgery) Eustace Moore, MD (Neurosurgery) Juluis Rainier [facility]) Jerline Pain, MD (Cardiology) [name], [facility] as Pharmacist (Pharmacist)   Type of visit: Telephone/Video Circumstance: COVID-19 national emergency Identification of patient Luke [name] with 1945-08-02 and MRN [id number] - 2 person identifier Risks: Risks, benefits, limitations of telephone visit explained. Patient understood and verbalized agreement to proceed Anyone else on call: just patient Patient location: [phone] -  This provider location: [place], [employer]  This Provider for this visit: Treatment Team:  Attending Provider: Brand Males, MD    02/14/2021 -  FU IPF with some emphysema. Has sCHF  HPI Luke [name] 76 y.o. -telephone visit to  see how his esbriet started is going butt turns out he says he never got referred to pharmacist. Pharmacist confirmed that she is yet to meet iwht him. No esbriet started. Says even spirivan not started. Also, supposed to be telephone visit but got confusing call and he showed up at front desk only to be sent home. Apologized for gap in customer service. Overall well. He said after last vsit he read more about esbriet and got apprehensive esp in light of CKD but lab review shows GFR > 60 this year. 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  Instructions   - Slowly increase the dose per protocol  -Always take it with food  -Any nausea you can try ginger capsules  -Give at least between 5 and 6  hours between dosing  -Good idea to participate in [place] sponsored medication support program - this is optional  -Definitely apply sunscreen when you go out with this medication  - You will need monthly liver tests for 6 months and thereafter every 3 months  - explained in decreaesd renal functio that side ffects could be higher but his GFR is good enough for full dose  - explained benefit of slowing fibrosis progression and that NNT of 1 to 6 is pretty good   We resolved we will jsu start and titrate up to max dose of 2 pills tid which is 2/3rd normal dose   CT Chest data  No results found.  Results for ATHANASIUS, KESLING" (MRN [id number]) as of 02/14/2021 10:42  Ref. Range 10/23/2020 10:57  Creatinine Latest Ref Range: 0.76 - 1.27 mg/dL 1.17  Results for DALIN, CALDERA" (MRN [id number]) as of 02/14/2021 10:42  Ref. Range 01/02/2021 15:40  AST Latest Ref Range: 0 - 37 U/L 17  ALT Latest Ref Range: 0 - 53 U/L 16    PFT  No flowsheet data found.   OV 05/10/2021  Subjective:  Patient ID: Luke [name], male , DOB: [birth year] , age 61 y.o. , MRN: [id number] , ADDRESS: [place] [address] PCP Tonia Ghent, MD Patient Care Team: Tonia Ghent, MD as PCP - General Angelia Mould, MD (Vascular Surgery) Arta Silence, MD (Gastroenterology) Michael Boston, MD (General Surgery) Eustace Moore, MD (Neurosurgery) Juluis Rainier (Optometry) Jerline Pain, MD (Cardiology) [name], [facility] as Pharmacist (Pharmacist)  This Provider for this visit: Treatment Team:  Attending Provider: Brand Males, MD    05/10/2021 -   Chief Complaint  Patient presents with   Follow-up    PFT performed today.  Pt states he has been doing okay since last visit and denies any complaints.   02/14/2021 -  FU IPF with some emphysema. Has sCHF = - esbriet shipment at his home 03/23/21  - on esbiret 2 pills tid due to CKD hx  (ofev 2nd choice due to his heart issues)     HPI Luke [name] 76 y.o. -presents for follow-up.  Since his last visit he started pirfenidone.  After starting pirfenidone he developed COVID-19.  In the midst of all this he had to hold his COVID.  He also had diarrhea.  It was not fully clear in November 2022 whether the diarrhea was from [place] or from pirfenidone.  Nevertheless sometime around Thanksgiving he restarted pirfenidone.  He slowly escalated himself within a week to 2 pills 3 times  daily.  He is by choice at 2 pills 3 times daily because of a history of chronic kidney disease although most recent creatinine and GFR were greater than 60.  He says currently is doing 2 pills 3 times daily [submaximal but still therapeutic effect] and is tolerating this well without any GI side effects.  He does have chronic dry skin with flaking.  He says it is refractory.  We discussed about trying flaxseed.  He will do that.  We reviewed his first pulmonary function test.  Between the [facility] and DLCO when averaged he has 60% lung capacity.  He is doing Spiriva but is not sure it is helping him.  His most recent liver function test was 1 month ago.  He will have another 1 today.        OV 08/08/2021  Subjective:  Patient ID: Luke [name], male , DOB: Jul 25, 1945 , age 75 y.o. , MRN: [id number] , ADDRESS: [place] [address] PCP Tonia Ghent, MD Patient Care Team: Tonia Ghent, MD as PCP - General Angelia Mould, MD (Vascular Surgery) Arta Silence, MD (Gastroenterology) Michael Boston, MD (General Surgery) Eustace Moore, MD (Neurosurgery) Juluis Rainier (Optometry) Jerline Pain, MD (Cardiology) [name], [facility] as Pharmacist (Pharmacist)  This Provider for this visit: Treatment Team:  Attending Provider: Brand Males, MD    08/08/2021 -   Chief Complaint  Patient presents with   Follow-up    3 mo f/u for IPF. Stopped the Esbriet due to  increased dizziness combined with the isosorbide. Will resume in a few weeks.    02/14/2021 -  FU IPF with some emphysema. Has sCHF = - esbriet shipment at his home 03/23/21  - on esbiret 2 pills tid due to CKD hx (ofev 2nd choice due to his heart issues)  HPI Luke [name] 76 y.o. -returns for follow-up.  At this visit the original plan was to increase his pirfenidone but he tells me that while doing his daily exercises he started noticing chest pain on the treadmill.  This followed eye surgery in January 2023.  He had cardiac cath mid March 2023 and he had 100% blockage in one of his vessels.  His isosorbide was then increased.  He started feeling dizzy.  He attributed part of the dizziness due to pirfenidone.  He says pirfenidone was already giving him dizziness and this was made worse by the isosorbide.  He then decided to take his isosorbide half pill twice daily which helped his dizziness.  But he still was dizzy so he stopped his pirfenidone and the dizziness improved a lot.  He decided to protect his heart over his lung.  He has upcoming visit with cardiology in Sep 11, 2021.  Till then he wants to be on pirfenidone holiday.  After that he is definitely intending to restart the pirfenidone.  We discussed nintedanib and because of potential cardiac risk he does not want to do it.  We discussed clinical trials he is interested but he wants to rechallenge himself with pirfenidone first.  Currently he is walking 2 miles per day on a treadmill at 4 miles an hour without an incline and it takes 30 minutes.  Current symptom score is below.  Labs look okay .  The chest pain is improved  Regarding his dry skin: This is better after flaxseed.  He asked for some advice about reversing atherosclerosis.  I referred him to the [employer].  He understands this means strict vegetarian/vegan diet.  He admits readily that he is a carnivore.  In the last meat-based diet.   OV 10/03/2021  Subjective:   Patient ID: Luke [name], male , DOB: Jul 02, 1945 , age 60 y.o. , MRN: [id number] , ADDRESS: [place] [address] PCP Tonia Ghent, MD Patient Care Team: Tonia Ghent, MD as PCP - General Jerline Pain, MD as PCP - Cardiology (Cardiology) Angelia Mould, MD (Vascular Surgery) Arta Silence, MD (Gastroenterology) Michael Boston, MD (General Surgery) Eustace Moore, MD (Neurosurgery) Juluis Rainier (Optometry) Jerline Pain, MD (Cardiology) [name], [facility] as Pharmacist (Pharmacist)  This Provider for this visit: Treatment Team:  Attending Provider: Brand Males, MD  02/14/2021 -  FU IPF with some emphysema. Has sCHF = - esbriet shipment at his home 03/23/21  - on esbiret 2 pills tid due to CKD hx (ofev kept as 2nd choice due to his heart issues)  -  esbriet on hold since mid march 2023 due to dizziness made by worse by nitrate and improved after stopping esbriet  - restart late may 2023  10/03/2021 -   Chief Complaint  Patient presents with   Follow-up    Pt states he has been doing okay since last visit and denies any real complaints.     HPI Luke [name] 76 y.o. -returns for follow-up.  He says he is doing well.  The symptoms are minimal.  He continues to exercise.  He says he has difficulty tolerating medication adds.  He has not been stable on the isosorbide which gave him dizziness in the setting of pirfenidone.  He stopped the pirfenidone although he attributed the dizziness to the isosorbide.  After stopping pirfenidone his dizziness went away.  Now for the last 20 days he is also been on valsartan.  He believes it was a new start.  For the last 1 week he is now back on pirfenidone at 1 pill 3 times daily.  There are no side effects right now.  He is trying to go to 2 pills 3 times daily [the maximum dose and account of his CKD].  He is worried that he can get side effects such as fogginess and headaches.  But he is willing  to take it and monitor.  He is interested in clinical trials but at this point in time his is.  Is not on a stable dose.  His symptom score is stable His pulmonary function test shows stability/improvement in FVC.  Last CT scan of the chest May 2022     OV 01/10/2022  Subjective:  Patient ID: Luke [name], male , DOB: [birth year] , age 57 y.o. , MRN: [id number] , ADDRESS: [place] [address] PCP Tonia Ghent, MD Patient Care Team: Tonia Ghent, MD as PCP - General Jerline Pain, MD as PCP - Cardiology (Cardiology) Angelia Mould, MD (Vascular Surgery) Arta Silence, MD (Gastroenterology) Michael Boston, MD (General Surgery) Eustace Moore, MD (Neurosurgery) Juluis Rainier (Optometry) Jerline Pain, MD (Cardiology) Charlton Haws, [facility] as Pharmacist (Pharmacist)  This Provider for this visit: Treatment Team:  Attending Provider: Brand Males, MD    01/10/2022 -   Chief Complaint  Patient presents with   Follow-up    PFT performed today.  Pt states he has been dong okay since last visit and denies any complaints.   02/14/2021 -  FU IPF with some emphysema.  Has sCHF = - esbriet shipment at his home 03/23/21  - on esbiret 2 pills tid due to CKD hx (ofev kept as 2nd choice due to his heart issues)  -  esbriet on hold since mid march 2023 due to dizziness made by worse by nitrate and improved after stopping esbriet  - restart late may 2023  HPI Luke [name] 76 y.o. -returns for follow-up.  He continues to do well.  He is back on pirfenidone at 2 pills 3 times daily.  He does not want to escalate this further because of dizziness that he feels is because of valsartan and nitrates.  He has adjusted his valsartan and nitrate regimen but he still has dizziness.  With the pirfenidone he feels goofy.  He says the side effects overlapping but somewhat different.  He is able to differentiate the side effects between the 2 but certainly  if he increases his pirfenidone he will get more dizzy.  His shortness of breath is stable his walking desaturation test is stable.  His pulmonary function test is stable.  His CT scan of the chest also shows stability and IPF over 1 year.  Of note he has some new nodules described in the right lower lobe in August 2023 CT chest.  A 16-monthfollow-up is recommended.  He had recent liver function test and that is normal.     SYMPTOM SCALE - ILD 01/02/2021  05/10/2021 191# 08/08/2021 188# -4 miles an hour on a treadmill for 30 minutes.  Covers 2 miles on no incline 10/03/2021  01/10/2022 187#  O2 use ra ra ra ra ra  Shortness of Breath 0 -> 5 scale with 5 being worst (score 6 If unable to do)      At rest 0 0 0 0 0  Simple tasks - showers, clothes change, eating, shaving 0 0 0  0  Household (dishes, doing bed, laundry) 0 0 0 0 0  Shopping 0 0 0 0 0  Walking level at own pace 0 0 0 00 0  Walking up Stairs '2 1 2 2 2  '$ Total (30-36) Dyspnea Score '2 1 2 2 2  '$ How bad is your cough? 0 0 0 00 0  How bad is your fatigue 00 0 0 0 0  How bad is nausea 0 0 0 0 0  How bad is vomiting?  0 0 0 0   How bad is diarrhea? 00 0 0 0 0  How bad is anxiety? 0 0 0 0 00  How bad is depression 0 00 0 0 0  00      Simple office walk 185 feet x  3 laps goal with forehead probe 01/02/2021  08/08/2021  01/10/2022   O2 used ra ra ra  Number laps completed '3 3 2  '$ Comments about pace avg    Resting Pulse Ox/HR 97% and 71/min 98% and HR 71 98% and HR66  Final Pulse Ox/HR 95% and 94/min 91% and HR 86 93% and HR 91  Desaturated </= 88% no no No  Desaturated <= 3% points no Yes, 7 poins Yes 5 ponts  Got Tachycardic >/= 90/min yes no yes  Symptoms at end of test none none none  Miscellaneous comments x      CT Chest data - HRCT Aug 2023  Narrative & Impression  CLINICAL DATA:  Interstitial lung disease   EXAM: CT CHEST WITHOUT CONTRAST   TECHNIQUE: Multidetector CT imaging of the chest was performed  following the standard protocol without intravenous contrast. High resolution imaging of the lungs, as well as inspiratory and expiratory imaging, was performed.   RADIATION DOSE REDUCTION: This exam was performed according to the departmental dose-optimization program which includes automated exposure control, adjustment of the mA and/or kV according to patient size and/or use of iterative reconstruction technique.   COMPARISON:  Chest CT dated Oct 03, 2020   FINDINGS: Cardiovascular: Normal heart size. No pericardial effusion. Normal caliber thoracic aorta with severe calcified plaque. Severe coronary artery calcifications of the LAD. Partially visualized left chest wall pacer wires with leads in the right atrium, right ventricle and lead overlying the left ventricle.   Mediastinum/Nodes: Esophagus and thyroid are unremarkable. No pathologically enlarged lymph nodes seen in the chest.   Lungs/Pleura: Central airways are patent. No significant air trapping. Severe centrilobular emphysema. Subpleural and basilar predominant reticular opacities with associated ground-glass, traction bronchiectasis. Honeycomb change is seen in the medial right lower lobe on series 12 image 199, not significantly changed when compared with prior exam. No definite evidence of progressive fibrosis. New clustered part solid nodules of the right lower lobe, largest nodule measures 17 mm in overall diameter with 6 mm solid component, located on series 12, image 261.   Upper Abdomen: No acute abnormality.   Musculoskeletal: No chest wall mass or suspicious bone lesions identified.   IMPRESSION: 1. Basilar and subpleural predominant fibrotic interstitial lung disease with traction bronchiectasis and honeycomb change. No evidence of progression. Findings are consistent with UIP per consensus guidelines: Diagnosis of Idiopathic Pulmonary Fibrosis: An Official ATS/ERS/JRS/ALAT Clinical Practice  Guideline. [place], Iss 5, [phone], Jan 04 2017. 2. New clustered part solid pulmonary nodules of the right lower lobe, likely due to infection or aspiration. Recommend follow-up chest CT in 3 months to ensure resolution. 3. Aortic Atherosclerosis (ICD10-I70.0) and Emphysema (ICD10-J43.9).     Electronically Signed   By: Yetta Glassman M.D.   On: 12/07/2021 10:54    No results found.    PFT     Latest Ref Rng & Units 01/10/2022    1:42 PM 10/02/2021    8:45 AM 05/10/2021   11:45 AM  PFT Results  FVC-Pre L 3.73  P 4.09  3.63   FVC-Predicted Pre % 81  P 88  78   Pre FEV1/FVC % % 69  P 68  70   FEV1-Pre L 2.56  P 2.77  2.55   FEV1-Predicted Pre % 77  P 82  75   DLCO uncorrected ml/min/mmHg 11.84  P 13.10  11.58   DLCO UNC% % 44  P 48  43   DLCO corrected ml/min/mmHg 11.84  P 13.45  11.36   DLCO COR %Predicted % 44  P 50  42   DLVA Predicted % 67  P 62  56     P Preliminary result    LABS    PULMONARY No results for input(s): "PHART", "PCO2ART", "PO2ART", "HCO3", "TCO2", "O2SAT" in the last 168 hours.  Invalid input(s): "PCO2", "PO2"  CBC No results for input(s): "HGB", "HCT", "WBC", "PLT" in the last 168 hours.  COAGULATION No results for input(s): "INR" in the last 168 hours.  CARDIAC  No results for input(s): "TROPONINI" in the last 168 hours. No results for input(s): "PROBNP" in the last 168 hours.   CHEMISTRY Recent Labs  Lab 01/08/22 1456  NA 136  K 4.8  CL 101  CO2 25  GLUCOSE 163*  BUN 22  CREATININE 1.23  CALCIUM 9.5   Estimated Creatinine Clearance: 57 mL/min (by C-G formula based on SCr of 1.23 mg/dL).   LIVER Recent Labs  Lab 01/08/22 1456  AST 15  15  ALT 13  13  ALKPHOS 67  67  BILITOT 0.5  0.5  PROT 7.0  7.0  ALBUMIN 4.0  4.0     INFECTIOUS No results for input(s): "LATICACIDVEN", "PROCALCITON" in the last 168 hours.   ENDOCRINE CBG (last 3)  No results for input(s): "GLUCAP" in the  last 72 hours.       IMAGING x48h  - image(s) personally visualized  -   highlighted in bold No results found.     has a past medical history of AAA (abdominal aortic aneurysm) ([place]) (2011), Anxiety, Atrial fibrillation ([place]), Automatic implantable cardioverter-defibrillator in situ, CAD (coronary artery disease), Cardiomyopathy ([place]), Diabetes mellitus, Drug therapy, Ejection fraction < 50%, Ejection fraction < 50%, Fall due to ice or snow (Feb.  19, 2015), HLD (hyperlipidemia), Hypertension, ICD (implantable cardioverter-defibrillator) in place, LBBB (left bundle branch block), Low testosterone, Myocardial infarction [facility]), Pacemaker, PAD (peripheral artery disease) ([place]), and Pancreatitis.   reports that he quit smoking about 17 years ago. His smoking use included cigarettes. He started smoking about 59 years ago. He has a 80.00 pack-year smoking history. He has never used smokeless tobacco.  Past Surgical History:  Procedure Laterality Date   ABDOMINAL AORTAGRAM N/A 10/28/2011   Procedure: ABDOMINAL [name];  Surgeon: Angelia Mould, MD;  Location: [facility] CATH LAB;  Service: Cardiovascular;  Laterality: N/A;   ABDOMINAL AORTIC ANEURYSM REPAIR     EVAR    BI-VENTRICULAR IMPLANTABLE CARDIOVERTER DEFIBRILLATOR N/A 07/16/2012   Procedure: BI-VENTRICULAR IMPLANTABLE CARDIOVERTER DEFIBRILLATOR  (CRT-D);  Surgeon: Deboraha Sprang, MD;  Location: [facility] CATH LAB;  Service: Cardiovascular;  Laterality: N/A;   BIV ICD GENERATOR CHANGEOUT N/A 10/25/2020   Procedure: BIV ICD GENERATOR CHANGEOUT;  Surgeon: Deboraha Sprang, MD;  Location: [place] CV LAB;  Service: Cardiovascular;  Laterality: N/A;   CARDIAC CATHETERIZATION     COLONOSCOPY  03/23/2018; 2020   ESOPHAGOGASTRODUODENOSCOPY N/A 02/12/2019   Procedure: ESOPHAGOGASTRODUODENOSCOPY (EGD);  Surgeon: Jackquline Denmark, MD;  Location: [facility] ENDOSCOPY;  Service: Endoscopy;  Laterality: N/A;   ESOPHAGOGASTRODUODENOSCOPY (EGD) WITH PROPOFOL N/A  02/05/2018   Procedure: ESOPHAGOGASTRODUODENOSCOPY (EGD) WITH PROPOFOL;  Surgeon: Mauri Pole, MD;  Location: [facility] ENDOSCOPY;  Service: Endoscopy;  Laterality: N/A;   ESOPHAGOGASTRODUODENOSCOPY (EGD) WITH PROPOFOL N/A 09/27/2018   Procedure: ESOPHAGOGASTRODUODENOSCOPY (EGD) WITH PROPOFOL;  Surgeon: Carol Ada, MD;  Location: [place];  Service: Endoscopy;  Laterality: N/A;   FOREIGN BODY REMOVAL  09/27/2018   Procedure: FOREIGN BODY REMOVAL;  Surgeon: Carol Ada, MD;  Location: [facility] ENDOSCOPY;  Service: Endoscopy;;   FOREIGN BODY REMOVAL  02/12/2019   Procedure: FOREIGN BODY REMOVAL;  Surgeon: Jackquline Denmark, MD;  Location: [facility] ENDOSCOPY;  Service: Endoscopy;;   HERNIA REPAIR  Jan. 9, 2015   INSERTION OF MESH N/A 05/14/2013   Procedure: INSERTION OF MESH;  Surgeon: Adin Hector, MD;  Location: [place];  Service: General;  Laterality: N/A;   LEFT HEART CATH AND CORONARY ANGIOGRAPHY N/A 07/17/2021   Procedure: LEFT HEART CATH AND CORONARY ANGIOGRAPHY;  Surgeon: [place] Sine, MD;  Location: [place] CV LAB;  Service: Cardiovascular;  Laterality: N/A;   LOWER EXTREMITY ANGIOGRAM Bilateral 10/28/2011   Procedure: LOWER EXTREMITY ANGIOGRAM;  Surgeon: Angelia Mould, MD;  Location: [facility] CATH LAB;  Service: Cardiovascular;  Laterality: Bilateral;   PACEMAKER INSERTION  07-16-12   pacemaker/defibrilator   POLYPECTOMY     POSTERIOR CERVICAL FUSION/FORAMINOTOMY N/A 06/09/2014   Procedure: Laminectomy - Cervical two-Cervcial four posterior cervical instrumented fusion Cervical two-cervical four;  Surgeon: Eustace Moore, MD;  Location: [place] NEURO ORS;  Service: Neurosurgery;  Laterality: N/A;  posterior    SAVORY DILATION N/A 02/05/2018   Procedure: SAVORY DILATION;  Surgeon: Mauri Pole, MD;  Location: [facility] ENDOSCOPY;  Service: Endoscopy;  Laterality: N/A;   TONSILLECTOMY     as a child    UMBILICAL HERNIA REPAIR N/A 05/14/2013   Procedure: LAPAROSCOPIC exploration and repair of hernia in  abdominal ;  Surgeon: Adin Hector, MD;  Location: [place];  Service: General;  Laterality: N/A;   UPPER GASTROINTESTINAL ENDOSCOPY  2020    Allergies  Allergen Reactions   Aldactone [Spironolactone] Other (See Comments)    Hyperkalemia   Codeine Rash and Hives   Atorvastatin     Myalgias with lipitor.  Does tolerate simvastatin.     Januvia [Sitagliptin] Other (See Comments)    Diarrhea and heart racing   Jardiance [Empagliflozin] Other (See Comments)    Polyuria; excessive weight loss   Lisinopril     Possible cause of pancreatitis    Immunization History  Administered Date(s) Administered   Fluad Quad(high Dose 65+) 02/23/2020, 04/13/2021   Influenza Split 01/31/2011, 02/11/2012   Influenza Whole 02/27/2010   Influenza,inj,Quad PF,6+ Mos 03/18/2013, 03/25/2014, 01/19/2015, 02/21/2016, 02/27/2017, 02/12/2018, 02/18/2019   Influenza-Unspecified 03/03/2020   PFIZER(Purple Top)SARS-COV-2 Vaccination 05/23/2019, 06/12/2019   Pneumococcal Conjugate-13 10/18/2014   Pneumococcal Polysaccharide-23 11/14/2011   Td 07/05/2010    Family History  Problem Relation Age of Onset   Hypertension Mother    Stroke Mother    Hyperlipidemia Mother    Diabetes Sister    Heart disease Sister        Before age 77   Hypertension Sister    Hyperlipidemia Sister    Heart attack Sister    Lung cancer Father    Hypertension Son    Colon cancer Neg Hx    Prostate cancer Neg Hx    Esophageal cancer Neg Hx    Stomach cancer Neg Hx    Rectal cancer Neg Hx    Colon polyps Neg Hx      Current Outpatient Medications:    albuterol (VENTOLIN HFA) 108 (90 Base) MCG/ACT inhaler, Inhale 2 puffs into the lungs every 6 (six) hours as needed for wheezing or shortness of breath., Disp: 8 g, Rfl: 6   amLODipine (NORVASC) 10 MG tablet, TAKE 1 TABLET(10 MG) BY MOUTH DAILY, Disp: 90 tablet, Rfl: 3   aspirin EC 81 MG tablet, Take 81 mg by mouth every evening., Disp: , Rfl:    carvedilol (COREG) 25 MG  tablet, TAKE 1 TABLET BY MOUTH TWICE DAILY WITH MEALS, Disp: 180 tablet, Rfl: 3   Cholecalciferol (VITAMIN D3) 10 MCG (400 UNIT) CAPS, Take 1 capsule by mouth daily., Disp: , Rfl:    glipiZIDE (GLUCOTROL) 5 MG tablet, TAKE 1 TABLET(5 MG) BY MOUTH TWICE DAILY BEFORE A MEAL, Disp: 180 tablet, Rfl: 1   glucose blood (ACCU-CHEK AVIVA PLUS) test strip, AS DIRECTED BLOOD GLUCOSE TWICE DAILY, Disp: 200 strip, Rfl: 3   insulin glargine (LANTUS SOLOSTAR) 100 UNIT/ML Solostar Pen, INJECT 0.3 TO 0.35 MLS(30 TO 35 UNITS) INTO THE SKIN EVERY DAY, Disp: 15 mL, Rfl: 3   isosorbide mononitrate (IMDUR) 30 MG 24 hr tablet, Take  2 tablets (60 mg total) by mouth daily., Disp: 180 tablet, Rfl: 3   metFORMIN (GLUCOPHAGE) 500 MG tablet, TAKE 2 TABLETS BY MOUTH TWICE DAILY WITH FOOD, Disp: 360 tablet, Rfl: 3   Multiple Vitamin (MULTIVITAMIN WITH MINERALS) TABS tablet, Take 1 tablet by mouth every evening., Disp: , Rfl:    omeprazole (PRILOSEC) 40 MG capsule, TAKE 1 CAPSULE(40 MG) BY MOUTH TWICE DAILY, Disp: 180 capsule, Rfl: 0   Pirfenidone (ESBRIET) 267 MG TABS, Take 2 tablets by mouth in the morning, at noon, and at bedtime., Disp: , Rfl:    simvastatin (ZOCOR) 40 MG tablet, Take 1 tablet (40 mg total) by mouth at bedtime. Has tolerated even with amlodipine use.  He didn't tolerate atorvastatin., Disp: 90 tablet, Rfl: 3   umeclidinium bromide (INCRUSE ELLIPTA) 62.5 MCG/ACT AEPB, Inhale 1 puff into the lungs daily., Disp: 90 each, Rfl: 3   valsartan (DIOVAN) 40 MG tablet, Take 1 tablet (40 mg total) by mouth daily., Disp: 30 tablet, Rfl: 11   vitamin B-12 (CYANOCOBALAMIN) 1000 MCG tablet, Take 1,000 mcg by mouth in the morning., Disp: , Rfl:       Objective:   Vitals:   01/10/22 1428  BP: 134/72  Pulse: 66  SpO2: 98%  Weight: 187 lb 3.2 oz (84.9 kg)  Height: 6' (1.829 m)    Estimated body mass index is 25.39 kg/m as calculated from the following:   Height as of this encounter: 6' (1.829 m).   Weight as of  this encounter: 187 lb 3.2 oz (84.9 kg).  '@WEIGHTCHANGE'$ @  [employer]   01/10/22 1428  Weight: 187 lb 3.2 oz (84.9 kg)     Physical ExamGeneral: No distress. Looks well Neuro: Alert and Oriented x 3. GCS 15. Speech normal Psych: Pleasant Resp:  Barrel Chest - no.  Wheeze - no, Crackles - YES ASE, No overt respiratory distress CVS: Normal heart sounds. Murmurs - no Ext: Stigmata of Connective Tissue Disease - no HEENT: Normal upper airway. PEERL +. No post nasal drip        Assessment:       ICD-10-CM   1. IPF (idiopathic pulmonary fibrosis) (HCC)  J84.112 CT Chest High Resolution    Pulmonary function test    2. Centrilobular emphysema ([place])  J43.2     3. Medication monitoring encounter  Z51.81          Plan:     Patient Instructions     ICD-10-CM   1. IPF (idiopathic pulmonary fibrosis) (HCC)  J84.112 Hepatic function panel    Hepatic function panel    2. Centrilobular emphysema ([place])  J43.2     3. 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[place])  J43.9    J84.10       Re IPF  - sstable on PFT and CT > 1 year +  Plan - cotninue esbriet  at 2 pill thress times daily; take with food - clinical trial consideration in future - consider TETON study with inh - do spirom/dlco n 3 months - RSV, Flu and covid vaccines in fall 2023   Emphysema -   Stable  Plan -continue spiriva respimat 2 puff daily  - continue  albuterol prn  Lung nodules right - cluster - new Aug 2023  Plan  - Do CT chest without contrast at  3 month point - early Nov 2023 which is in 8 weeks from now  Followup -  - will call with CT results in 8 weeks -  Rafiq Bucklin 30 min visit after spiro/dlco in 12 weeks but after PFT in 12 weeks   - symptom score and walk test at followup  High complex condition requiring intensive therapeutic monitoring   SIGNATURE    Dr. Brand Males, M.D., F.C.C.P,  Pulmonary and Critical Care Medicine Staff Physician, [place]  Director - Interstitial Lung Disease  Program  Pulmonary [place] [facility] [place], [place], [address]  Pager: [phone], If no answer or between  15:00h - 7:00h: call [phone] Telephone: [phone]  3:12 PM 01/10/2022

## 2022-01-10 NOTE — Progress Notes (Signed)
Spirometry and DLCO Performed Today.  

## 2022-01-15 DIAGNOSIS — H35372 Puckering of macula, left eye: Secondary | ICD-10-CM | POA: Diagnosis not present

## 2022-01-15 DIAGNOSIS — H2511 Age-related nuclear cataract, right eye: Secondary | ICD-10-CM | POA: Diagnosis not present

## 2022-01-15 DIAGNOSIS — H35431 Paving stone degeneration of retina, right eye: Secondary | ICD-10-CM | POA: Diagnosis not present

## 2022-01-15 DIAGNOSIS — E113593 Type 2 diabetes mellitus with proliferative diabetic retinopathy without macular edema, bilateral: Secondary | ICD-10-CM | POA: Diagnosis not present

## 2022-01-16 ENCOUNTER — Ambulatory Visit: Payer: Medicare Other

## 2022-01-23 ENCOUNTER — Ambulatory Visit (INDEPENDENT_AMBULATORY_CARE_PROVIDER_SITE_OTHER): Payer: Medicare Other

## 2022-01-23 DIAGNOSIS — I442 Atrioventricular block, complete: Secondary | ICD-10-CM

## 2022-01-23 LAB — CUP PACEART REMOTE DEVICE CHECK
Battery Remaining Longevity: 82 mo
Battery Remaining Percentage: 85 %
Battery Voltage: 2.98 V
Brady Statistic AP VP Percent: 28 %
Brady Statistic AP VS Percent: 1 %
Brady Statistic AS VP Percent: 69 %
Brady Statistic AS VS Percent: 1 %
Brady Statistic RA Percent Paced: 26 %
Date Time Interrogation Session: 20230920022526
HighPow Impedance: 77 Ohm
Implantable Lead Implant Date: 20140313
Implantable Lead Implant Date: 20140313
Implantable Lead Implant Date: 20140313
Implantable Lead Location: 753858
Implantable Lead Location: 753859
Implantable Lead Location: 753860
Implantable Pulse Generator Implant Date: 20220622
Lead Channel Impedance Value: 400 Ohm
Lead Channel Impedance Value: 480 Ohm
Lead Channel Impedance Value: 860 Ohm
Lead Channel Pacing Threshold Amplitude: 0.5 V
Lead Channel Pacing Threshold Amplitude: 0.625 V
Lead Channel Pacing Threshold Amplitude: 1.625 V
Lead Channel Pacing Threshold Pulse Width: 0.5 ms
Lead Channel Pacing Threshold Pulse Width: 0.5 ms
Lead Channel Pacing Threshold Pulse Width: 0.6 ms
Lead Channel Sensing Intrinsic Amplitude: 11.8 mV
Lead Channel Sensing Intrinsic Amplitude: 3.7 mV
Lead Channel Setting Pacing Amplitude: 1.625
Lead Channel Setting Pacing Amplitude: 2 V
Lead Channel Setting Pacing Amplitude: 2.125
Lead Channel Setting Pacing Pulse Width: 0.5 ms
Lead Channel Setting Pacing Pulse Width: 0.6 ms
Lead Channel Setting Sensing Sensitivity: 0.5 mV
Pulse Gen Serial Number: 810030647

## 2022-02-04 NOTE — Progress Notes (Signed)
Remote ICD transmission.   

## 2022-02-08 DIAGNOSIS — Z23 Encounter for immunization: Secondary | ICD-10-CM | POA: Diagnosis not present

## 2022-02-22 ENCOUNTER — Other Ambulatory Visit: Payer: Self-pay | Admitting: Pharmacist

## 2022-02-22 MED ORDER — ESBRIET 267 MG PO TABS: 2.0000 | ORAL_TABLET | Freq: Three times a day (TID) | ORAL | 1 refills | Status: DC

## 2022-02-22 NOTE — Telephone Encounter (Signed)
Refill sent for ESBRIET to [facility] ([facility]) for Esbriet: [phone]  Dose: 534 mg three times daily (LOW DOSE)  Last OV: 01/10/22 Provider: Dr. Chase Caller  Next OV: 04/04/22  LFTs on 01/08/22 wnl  Jennye Boroughs Wilhemina Bonito, PharmD, MPH, BCPS Clinical Pharmacist (Rheumatology and Pulmonology)

## 2022-03-04 ENCOUNTER — Other Ambulatory Visit: Payer: Self-pay | Admitting: Family Medicine

## 2022-03-07 ENCOUNTER — Other Ambulatory Visit: Payer: Medicare Other

## 2022-03-12 ENCOUNTER — Other Ambulatory Visit: Payer: Self-pay | Admitting: Family Medicine

## 2022-03-13 ENCOUNTER — Other Ambulatory Visit (HOSPITAL_COMMUNITY): Payer: Self-pay

## 2022-03-13 ENCOUNTER — Telehealth: Payer: Self-pay

## 2022-03-13 NOTE — Telephone Encounter (Signed)
Received fax from [facility] stating that they have been unable to contact the pt for 2024 PAP renewal. Contacted pt and obtained financial information and verified that his insurance is still the same from last year.  Per previous encounter pt's PA expired 02/14/22, submitting PA renewal request in case it is required for re-enrollment.  Submitted a Prior Authorization request to [facility] for PIRFENIDONE via CoverMyMeds. Will update once we receive a response.   Key: [id number]

## 2022-03-13 NOTE — Telephone Encounter (Addendum)
It does not appear that we have historically received a PA approval letter through this pt's insurance, will go ahead and fax in renewal page.  Will await response.  Phone# [phone] Fax# [phone]

## 2022-03-13 NOTE — Telephone Encounter (Signed)
Received notification from [facility] regarding a prior authorization for PIRFENIDONE. Authorization has been APPROVED from 03/13/2022 to 03/14/2023.   Authorization # [id number]

## 2022-03-14 ENCOUNTER — Other Ambulatory Visit (INDEPENDENT_AMBULATORY_CARE_PROVIDER_SITE_OTHER): Payer: Medicare Other

## 2022-03-14 DIAGNOSIS — Z5181 Encounter for therapeutic drug level monitoring: Secondary | ICD-10-CM | POA: Diagnosis not present

## 2022-03-14 DIAGNOSIS — J84112 Idiopathic pulmonary fibrosis: Secondary | ICD-10-CM | POA: Diagnosis not present

## 2022-03-14 LAB — HEPATIC FUNCTION PANEL
ALT: 14 U/L (ref 0–53)
AST: 15 U/L (ref 0–37)
Albumin: 4.2 g/dL (ref 3.5–5.2)
Alkaline Phosphatase: 64 U/L (ref 39–117)
Bilirubin, Direct: 0.2 mg/dL (ref 0.0–0.3)
Total Bilirubin: 0.6 mg/dL (ref 0.2–1.2)
Total Protein: 7.2 g/dL (ref 6.0–8.3)

## 2022-03-14 NOTE — Telephone Encounter (Signed)
Received notification from [facility] regarding a prior authorization for PIRFENIDONE. Authorization has been APPROVED from 03/13/22 to 03/14/23.   Knox Saliva, PharmD, MPH, BCPS, CPP Clinical Pharmacist (Rheumatology and Pulmonology)

## 2022-03-19 ENCOUNTER — Ambulatory Visit: Payer: Medicare Other | Attending: Cardiology | Admitting: Cardiology

## 2022-03-19 ENCOUNTER — Encounter: Payer: Self-pay | Admitting: Cardiology

## 2022-03-19 VITALS — BP 140/60 | HR 67 | Ht 72.0 in | Wt 185.0 lb

## 2022-03-19 DIAGNOSIS — I442 Atrioventricular block, complete: Secondary | ICD-10-CM | POA: Diagnosis not present

## 2022-03-19 DIAGNOSIS — I5022 Chronic systolic (congestive) heart failure: Secondary | ICD-10-CM | POA: Diagnosis not present

## 2022-03-19 DIAGNOSIS — J84112 Idiopathic pulmonary fibrosis: Secondary | ICD-10-CM | POA: Diagnosis not present

## 2022-03-19 DIAGNOSIS — I25118 Atherosclerotic heart disease of native coronary artery with other forms of angina pectoris: Secondary | ICD-10-CM | POA: Insufficient documentation

## 2022-03-19 DIAGNOSIS — Z95810 Presence of automatic (implantable) cardiac defibrillator: Secondary | ICD-10-CM | POA: Diagnosis not present

## 2022-03-19 DIAGNOSIS — I2510 Atherosclerotic heart disease of native coronary artery without angina pectoris: Secondary | ICD-10-CM | POA: Diagnosis not present

## 2022-03-19 DIAGNOSIS — I447 Left bundle-branch block, unspecified: Secondary | ICD-10-CM | POA: Insufficient documentation

## 2022-03-19 NOTE — Progress Notes (Signed)
Cardiology Office Note:    Date:  03/19/2022   ID:  Luke [name], DOB Nov 20, 1945, MRN [id number]  PCP:  Tonia Ghent, MD   [facility] Providers Cardiologist:  Candee Furbish, MD     Referring MD: Tonia Ghent, MD    History of Present Illness:    Luke [name] is a 76 y.o. male here for for follow-up of coronary artery disease, cardiac catheterization 07/17/2021 80% LAD calcified lesion treated medically, distal RCA CTO disease with collateral blood flow from circumflex, EF 40 to 45%, CRT ICD.  During a prior visit on 01/01/2021 ischemic cardiomyopathy left bundle branch block CRT ICD Dr. Caryl Comes after an intermittent complete heart block.  He goes by Luke [name].  He is a former Dr. [name] patient.  No previous mode switches, normal device function.  He is a [place] vet.  [place] 2013 - 2V CAD Left anterior descending (LAD): focal 70-80% proximal. Long 40% mid vessel. Right coronary artery (RCA): Diffusely diseased. 50-60% mid vessel, 70% distal, 70% at bifurcation of PDA/PLOM.    Prior LV Ejection Fraction: 30%.     Today, he reports that he has stage 3 kidney disease. He is currently taking valsartan. He takes his medication at night time because if he takes his medication during the day he feels unwell and has trouble with his balance.  He tried to take Jardiance but this caused significant diarrhea.  He experiences shortness of breath, mainly with exertion.  He denies any palpitations, chest pain, or peripheral edema. No lightheadedness, headaches, syncope, orthopnea, or PND.  Past Medical History:  Diagnosis Date   AAA (abdominal aortic aneurysm) ([place]) 2011   Per vascular surgery   Anxiety    no med. in use for anxiety but pt. speaks openly of his stress & anxious feelings regarding impending surgery     Atrial fibrillation ([place])    Automatic implantable cardioverter-defibrillator in situ    CAD (coronary artery disease)    Presumed CAD with nuclear scan October 09, 2011,   large anteroseptal MI and inferior MI. Catheterization scheduled October 15, 2011   Cardiomyopathy [facility])    Nuclear, October 09, 2011, EF 30%, multiple focal wall motion abnormalities   Diabetes mellitus    type II   Drug therapy    Hyperkalemia with spironolactone   Ejection fraction < 50%    EF 30%, nuclear, October 09, 2011   Ejection fraction < 50%    EF previously 30%  //   EF 30-35%, echo, July 14, 2012, severe diffuse hypokinesis, PA pressure 43 mm mercury   Fall due to ice or snow Feb.  19, 2015   HLD (hyperlipidemia)    Hypertension    white coat HTN-- often elevated in office and controlled on outside checks.   ICD (implantable cardioverter-defibrillator) in place    CRT-D placed March, 2014 complete heart block and k dysfunction   LBBB (left bundle branch block)    LBBB on EKG October 11, 2011,  no prior EKG has been done   Low testosterone    Hx of   Myocardial infarction ([place])    Told in 2015-never knew-showed up on stress test for AAA;    Pacemaker    PAD (peripheral artery disease) ([place])    Pancreatitis     Past Surgical History:  Procedure Laterality Date   ABDOMINAL AORTAGRAM N/A 10/28/2011   Procedure: ABDOMINAL [name];  Surgeon: Angelia Mould, MD;  Location: [facility] CATH LAB;  Service: Cardiovascular;  Laterality: N/A;   ABDOMINAL AORTIC ANEURYSM REPAIR     EVAR    BI-VENTRICULAR IMPLANTABLE CARDIOVERTER DEFIBRILLATOR N/A 07/16/2012   Procedure: BI-VENTRICULAR IMPLANTABLE CARDIOVERTER DEFIBRILLATOR  (CRT-D);  Surgeon: Deboraha Sprang, MD;  Location: [facility] CATH LAB;  Service: Cardiovascular;  Laterality: N/A;   BIV ICD GENERATOR CHANGEOUT N/A 10/25/2020   Procedure: BIV ICD GENERATOR CHANGEOUT;  Surgeon: Deboraha Sprang, MD;  Location: [place] CV LAB;  Service: Cardiovascular;  Laterality: N/A;   CARDIAC CATHETERIZATION     COLONOSCOPY  03/23/2018; 2020   ESOPHAGOGASTRODUODENOSCOPY N/A 02/12/2019   Procedure: ESOPHAGOGASTRODUODENOSCOPY (EGD);  Surgeon: Jackquline Denmark,  MD;  Location: [facility] ENDOSCOPY;  Service: Endoscopy;  Laterality: N/A;   ESOPHAGOGASTRODUODENOSCOPY (EGD) WITH PROPOFOL N/A 02/05/2018   Procedure: ESOPHAGOGASTRODUODENOSCOPY (EGD) WITH PROPOFOL;  Surgeon: Mauri Pole, MD;  Location: [facility] ENDOSCOPY;  Service: Endoscopy;  Laterality: N/A;   ESOPHAGOGASTRODUODENOSCOPY (EGD) WITH PROPOFOL N/A 09/27/2018   Procedure: ESOPHAGOGASTRODUODENOSCOPY (EGD) WITH PROPOFOL;  Surgeon: Carol Ada, MD;  Location: [place];  Service: Endoscopy;  Laterality: N/A;   FOREIGN BODY REMOVAL  09/27/2018   Procedure: FOREIGN BODY REMOVAL;  Surgeon: Carol Ada, MD;  Location: [facility] ENDOSCOPY;  Service: Endoscopy;;   FOREIGN BODY REMOVAL  02/12/2019   Procedure: FOREIGN BODY REMOVAL;  Surgeon: Jackquline Denmark, MD;  Location: [facility] ENDOSCOPY;  Service: Endoscopy;;   HERNIA REPAIR  Jan. 9, 2015   INSERTION OF MESH N/A 05/14/2013   Procedure: INSERTION OF MESH;  Surgeon: Adin Hector, MD;  Location: [place];  Service: General;  Laterality: N/A;   LEFT HEART CATH AND CORONARY ANGIOGRAPHY N/A 07/17/2021   Procedure: LEFT HEART CATH AND CORONARY ANGIOGRAPHY;  Surgeon: Troy Sine, MD;  Location: [place] CV LAB;  Service: Cardiovascular;  Laterality: N/A;   LOWER EXTREMITY ANGIOGRAM Bilateral 10/28/2011   Procedure: LOWER EXTREMITY ANGIOGRAM;  Surgeon: Angelia Mould, MD;  Location: [facility] CATH LAB;  Service: Cardiovascular;  Laterality: Bilateral;   PACEMAKER INSERTION  07-16-12   pacemaker/defibrilator   POLYPECTOMY     POSTERIOR CERVICAL FUSION/FORAMINOTOMY N/A 06/09/2014   Procedure: Laminectomy - Cervical two-Cervcial four posterior cervical instrumented fusion Cervical two-cervical four;  Surgeon: Eustace Moore, MD;  Location: [place] NEURO ORS;  Service: Neurosurgery;  Laterality: N/A;  posterior    SAVORY DILATION N/A 02/05/2018   Procedure: SAVORY DILATION;  Surgeon: Mauri Pole, MD;  Location: [facility] ENDOSCOPY;  Service: Endoscopy;  Laterality: N/A;    TONSILLECTOMY     as a child    UMBILICAL HERNIA REPAIR N/A 05/14/2013   Procedure: LAPAROSCOPIC exploration and repair of hernia in abdominal ;  Surgeon: Adin Hector, MD;  Location: [place];  Service: General;  Laterality: N/A;   UPPER GASTROINTESTINAL ENDOSCOPY  2020    Current Medications: Current Meds  Medication Sig   albuterol (VENTOLIN HFA) 108 (90 Base) MCG/ACT inhaler Inhale 2 puffs into the lungs every 6 (six) hours as needed for wheezing or shortness of breath.   amLODipine (NORVASC) 10 MG tablet TAKE 1 TABLET(10 MG) BY MOUTH DAILY   aspirin EC 81 MG tablet Take 81 mg by mouth every evening.   carvedilol (COREG) 25 MG tablet TAKE 1 TABLET BY MOUTH TWICE DAILY WITH MEALS   Cholecalciferol (VITAMIN D3) 10 MCG (400 UNIT) CAPS Take 1 capsule by mouth daily.   ESBRIET 267 MG TABS Take 2 tablets (534 mg total) by mouth in the morning, at noon, and at bedtime. **NOTE LOW DOSE AS MAINTENANCE**   glipiZIDE (GLUCOTROL)  5 MG tablet TAKE 1 TABLET(5 MG) BY MOUTH TWICE DAILY BEFORE A MEAL   glucose blood (ACCU-CHEK AVIVA PLUS) test strip AS DIRECTED BLOOD GLUCOSE TWICE DAILY   insulin glargine (LANTUS SOLOSTAR) 100 UNIT/ML Solostar Pen INJECT 0.3 TO 0.35 MLS(30 TO 35 UNITS) INTO THE SKIN EVERY DAY   isosorbide mononitrate (IMDUR) 30 MG 24 hr tablet Take 2 tablets (60 mg total) by mouth daily.   metFORMIN (GLUCOPHAGE) 500 MG tablet TAKE 2 TABLETS BY MOUTH TWICE DAILY WITH FOOD   Multiple Vitamin (MULTIVITAMIN WITH MINERALS) TABS tablet Take 1 tablet by mouth every evening.   omeprazole (PRILOSEC) 40 MG capsule TAKE 1 CAPSULE(40 MG) BY MOUTH TWICE DAILY   simvastatin (ZOCOR) 40 MG tablet Take 1 tablet (40 mg total) by mouth at bedtime. Has tolerated even with amlodipine use.  He didn't tolerate atorvastatin.   umeclidinium bromide (INCRUSE ELLIPTA) 62.5 MCG/ACT AEPB Inhale 1 puff into the lungs daily.   valsartan (DIOVAN) 40 MG tablet Take 1 tablet (40 mg total) by mouth daily.   vitamin B-12  (CYANOCOBALAMIN) 1000 MCG tablet Take 1,000 mcg by mouth in the morning.     Allergies:   Aldactone [spironolactone], Codeine, Atorvastatin, Januvia [sitagliptin], Jardiance [empagliflozin], and Lisinopril   Social History   Socioeconomic History   Marital status: Married    Spouse name: Patti   Number of children: 1   Years of education: Not on file   Highest education level: Not on file  Occupational History   Occupation: retired  Tobacco Use   Smoking status: Former    Packs/day: 2.00    Years: 40.00    Total pack years: 80.00    Types: Cigarettes    Start date: 1964    Quit date: 05/06/2004    Years since quitting: 17.8   Smokeless tobacco: Never  Vaping Use   Vaping Use: Never used  Substance and Sexual Activity   Alcohol use: Yes    Alcohol/week: 0.0 - 2.0 standard drinks of alcohol    Comment: beer or wine  occassionally   Drug use: No   Sexual activity: Yes    Partners: Female  Other Topics Concern   Not on file  Social History Narrative   [place] vet, he has known service related agent orange exposure.    Retired   Chiropractor daily.     Social Determinants of Health   Financial Resource Strain: Low Risk  (12/18/2020)   Overall Financial Resource Strain (CARDIA)    Difficulty of Paying Living Expenses: Not very hard  Food Insecurity: No Food Insecurity (10/19/2021)   Hunger Vital Sign    Worried About Running Out of Food in the Last Year: Never true    Ran Out of Food in the Last Year: Never true  Transportation Needs: No Transportation Needs (10/19/2021)   PRAPARE - Hydrologist (Medical): No    Lack of Transportation (Non-Medical): No  Physical Activity: Not on file  Stress: Not on file  Social Connections: Not on file     Family History: The patient's family history includes Diabetes in his sister; Heart attack in his sister; Heart disease in his sister; Hyperlipidemia in his mother and sister; Hypertension in his mother,  sister, and son; Lung cancer in his father; Stroke in his mother. There is no history of Colon cancer, Prostate cancer, Esophageal cancer, Stomach cancer, Rectal cancer, or Colon polyps.  ROS:   Please see the history of present illness.    (+)  Shortness of breath (mainly with exertion) No syncope all other systems reviewed and are negative.  EKGs/Labs/Other Studies Reviewed:    The following studies were reviewed today:  Echo 07/17/2021: IMPRESSIONS   1. Left ventricular ejection fraction, by estimation, is 40 to 45%. The  left ventricle has mildly decreased function. The left ventricle  demonstrates regional wall motion abnormalities (see scoring  diagram/findings for description). Left ventricular  diastolic parameters are consistent with Grade I diastolic dysfunction  (impaired relaxation).   2. Right ventricular systolic function is normal. The right ventricular  size is mildly enlarged. There is normal pulmonary artery systolic  pressure.   3. The mitral valve is normal in structure. Trivial mitral valve  regurgitation.   4. The aortic valve is tricuspid. There is mild calcification of the  aortic valve. There is mild thickening of the aortic valve. Aortic valve  regurgitation is not visualized. Aortic valve sclerosis is present, with  no evidence of aortic valve stenosis.   Comparison(s): Prior images unable to be directly viewed, comparison made  by report only. The left ventricular function is worsened. The left  ventricular wall motion abnormalities are new.   Left Cardiac Cath 07/17/2021:   Mid RCA to Dist RCA lesion is 75% stenosed.   Dist RCA lesion is 100% stenosed.   Prox LAD to Mid LAD lesion is 80% stenosed.   Mid RCA lesion is 80% stenosed.   LV end diastolic pressure is normal.  Diagnostic Dominance: Right  LE Doppler 04/12/2021: Summary:  Right: Resting right ankle-brachial index indicates moderate right lower  extremity arterial disease. The right  toe-brachial index is abnormal.   Left: Resting left ankle-brachial index indicates mild left lower  extremity arterial disease. The left toe-brachial index is abnormal.   EVAR Duplex 04/12/2022: Summary:  Abdominal Aorta: Patent endovascular aneurysm repair with no evidence of endoleak.   Echocardiogram 07/16/2017: - Left ventricle: The cavity size was normal. Wall thickness was    normal. Systolic function was normal. The estimated ejection    fraction was in the range of 55% to 60%. Wall motion was normal;    there were no regional wall motion abnormalities. Doppler    parameters are consistent with abnormal left ventricular    relaxation (grade 1 diastolic dysfunction).  - Mitral valve: There was mild regurgitation.  - Pulmonary arteries: Systolic pressure was moderately increased.    PA peak pressure: 40 mm Hg (S).   Echo in 2014 showed EF of 30%   EKG:  EKG is personally reviewed and interpreted.  03/19/2022: EKG was not ordered. 12/17/2021: Rate 70 bpm. ECG upright QRS lead V1 and negative QRS lead I active BS   EKG is  ordered today.  The ekg ordered today demonstrates sinus rhythm V paced 63 bpm  Recent Labs: 10/03/2021: Hemoglobin 13.3; Platelets 250.0; Pro B Natriuretic peptide (BNP) 69.0 01/08/2022: BUN 22; Creatinine, Ser 1.23; Potassium 4.8; Sodium 136 03/14/2022: ALT 14  Recent Lipid Panel    Component Value Date/Time   CHOL 147 04/13/2021 1628   TRIG 276 (H) 04/13/2021 1628   HDL 37 (L) 04/13/2021 1628   CHOLHDL 4.0 04/13/2021 1628   VLDL 58.8 (H) 02/16/2020 0806   LDLCALC 74 04/13/2021 1628   LDLDIRECT 62.0 02/16/2020 0806     Risk Assessment/Calculations:              Physical Exam:    VS:  BP (!) 140/60 (BP Location: Left Arm, Patient Position: Sitting, Cuff Size: Normal)  Pulse 67   Ht 6' (1.829 m)   Wt 185 lb (83.9 kg)   BMI 25.09 kg/m     Wt Readings from Last 3 Encounters:  03/19/22 185 lb (83.9 kg)  01/10/22 187 lb 3.2 oz (84.9 kg)   12/17/21 186 lb 6.4 oz (84.6 kg)     GEN:  Well nourished, well developed in no acute distress HEENT: Normal NECK: No JVD; No carotid bruits LYMPHATICS: No lymphadenopathy CARDIAC:  RRR, no murmurs, no rubs, gallops RESPIRATORY:  Clear to auscultation without rales, wheezing or rhonchi  ABDOMEN: Soft, non-tender, non-distended MUSCULOSKELETAL:  No edema; No deformity  SKIN: Warm and dry NEUROLOGIC:  Alert and oriented x 3 PSYCHIATRIC:  Normal affect   ASSESSMENT:    1. Complete heart block ([place])   2. Chronic systolic heart failure ([place])   3. LBBB (left bundle branch block)   4. IPF (idiopathic pulmonary fibrosis) ([place])   5. Coronary artery disease of native artery of native heart with stable angina pectoris ([place])   6. Biventricular ICD (implantable cardioverter-defibrillator) in place     PLAN:    In order of problems listed above:    CAD (coronary artery disease) Previously had heart catheterization October 15, 2011 with moderate coronary artery disease no PCI at that time.  Had repeat heart catheterization in 2023 by Dr. Claiborne Billings, no PCI, treated medically.  Distal RCA CTO with collaterals from circumflex, LAD 80% highly calcified lesion.  Treated medically.  Had a large anteroseptal MI and inferior MI previously.  ICD in place.  Followed by Dr. Caryl Comes.  Continuing with carvedilol 25 mg, isosorbide 30 mg for antianginal support.   Complete heart block [facility]) ICD/CRT with recent replacement June 2022, Dr. Caryl Comes.  Working well.  No changes made.   IPF (idiopathic pulmonary fibrosis) ([place]) Pulmonary notes reviewed.  Dr. Chase Caller is following.   Biventricular ICD (implantable cardioverter-defibrillator) in place Dr. Caryl Comes monitoring.  Chronic systolic heart failure We had suggested previously Entresto as well as spironolactone as well as Jardiance.  Not able to take these medications due to side effects.  He is on valsartan.  We will continue with this.  He is also on  carvedilol.   Watch for any signs of anginal symptoms.   Medication Adjustments/Labs and Tests Ordered: Current medicines are reviewed at length with the patient today.  Concerns regarding medicines are outlined above.  No orders of the defined types were placed in this encounter.  No orders of the defined types were placed in this encounter.   Patient Instructions  Medication Instructions:  Your physician recommends that you continue on your current medications as directed. Please refer to the Current Medication list given to you today.  *If you need a refill on your cardiac medications before your next appointment, please call your pharmacy*   Lab Work: NONE  If you have labs (blood work) drawn today and your tests are completely normal, you will receive your results only by: [place] (if you have MyChart) OR A paper copy in the mail If you have any lab test that is abnormal or we need to change your treatment, we will call you to review the results.   Testing/Procedures: NONE   Follow-Up: At [facility], you and your health needs are our priority.  As part of our continuing mission to provide you with exceptional heart care, we have created designated Provider Care Teams.  These Care Teams include your primary Cardiologist (physician) and Advanced Practice Providers (  APPs -  Physician Assistants and Nurse Practitioners) who all work together to provide you with the care you need, when you need it.  We recommend signing up for the patient portal called "MyChart".  Sign up information is provided on this After Visit Summary.  MyChart is used to connect with patients for Virtual Visits (Telemedicine).  Patients are able to view lab/test results, encounter notes, upcoming appointments, etc.  Non-urgent messages can be sent to your provider as well.   To learn more about what you can do with MyChart, go to [email or web address].    Your next appointment:   1  year(s)  The format for your next appointment:   In Person  Provider:   Candee Furbish, MD     Important Information About Sugar          I,Rachel Rivera,acting as a scribe for Candee Furbish, MD.,have documented all relevant documentation on the behalf of Candee Furbish, MD,as directed by  Candee Furbish, MD while in the presence of Candee Furbish, MD.  I, Candee Furbish, MD, have reviewed all documentation for this visit. The documentation on 03/19/22 for the exam, diagnosis, procedures, and orders are all accurate and complete.   Signed, Candee Furbish, MD  03/19/2022 12:14 PM    [facility]

## 2022-03-19 NOTE — Patient Instructions (Signed)
Medication Instructions:  Your physician recommends that you continue on your current medications as directed. Please refer to the Current Medication list given to you today.  *If you need a refill on your cardiac medications before your next appointment, please call your pharmacy*   Lab Work: NONE  If you have labs (blood work) drawn today and your tests are completely normal, you will receive your results only by: [place] (if you have MyChart) OR A paper copy in the mail If you have any lab test that is abnormal or we need to change your treatment, we will call you to review the results.   Testing/Procedures: NONE   Follow-Up: At [facility], you and your health needs are our priority.  As part of our continuing mission to provide you with exceptional heart care, we have created designated Provider Care Teams.  These Care Teams include your primary Cardiologist (physician) and Advanced Practice Providers (APPs -  Physician Assistants and Nurse Practitioners) who all work together to provide you with the care you need, when you need it.  We recommend signing up for the patient portal called "MyChart".  Sign up information is provided on this After Visit Summary.  MyChart is used to connect with patients for Virtual Visits (Telemedicine).  Patients are able to view lab/test results, encounter notes, upcoming appointments, etc.  Non-urgent messages can be sent to your provider as well.   To learn more about what you can do with MyChart, go to [email or web address].    Your next appointment:   1 year(s)  The format for your next appointment:   In Person  Provider:   Candee Furbish, MD     Important Information About Sugar

## 2022-03-21 ENCOUNTER — Telehealth: Payer: Self-pay | Admitting: Pharmacist

## 2022-03-21 NOTE — Telephone Encounter (Signed)
Received message from patient - he is nearly out of Lantus. He reports last shipment from [place] came end of July. CCM team will reach out to [employer] to request refill -previously they had sent his shipment to wrong office [place]) so we will make sure they know to ship to [facility] location

## 2022-03-22 ENCOUNTER — Other Ambulatory Visit: Payer: Self-pay | Admitting: Family Medicine

## 2022-03-22 NOTE — Telephone Encounter (Signed)
[employer] has been contact and refill requested. I did verify it is being sent to [facility] address. Patient can expect delivery in 5-7 business days. Patient has been made aware.  Charlene Brooke, CPP notified  Marijean Niemann, [place] Clinical Pharmacy Assistant [phone]

## 2022-03-27 ENCOUNTER — Ambulatory Visit
Admission: RE | Admit: 2022-03-27 | Discharge: 2022-03-27 | Disposition: A | Discharge status: Home / Self Care | Payer: Medicare Other | Source: Ambulatory Visit | Attending: Internal Medicine | Admitting: Internal Medicine

## 2022-03-27 DIAGNOSIS — J432 Centrilobular emphysema: Secondary | ICD-10-CM | POA: Diagnosis not present

## 2022-03-27 DIAGNOSIS — J479 Bronchiectasis, uncomplicated: Secondary | ICD-10-CM | POA: Diagnosis not present

## 2022-03-27 DIAGNOSIS — J84112 Idiopathic pulmonary fibrosis: Secondary | ICD-10-CM | POA: Diagnosis not present

## 2022-03-27 DIAGNOSIS — K449 Diaphragmatic hernia without obstruction or gangrene: Secondary | ICD-10-CM | POA: Diagnosis not present

## 2022-04-01 NOTE — Telephone Encounter (Signed)
Patient came by and picked up medication. He also dropped off a form to be signed. Placed in your box. Thank you!

## 2022-04-01 NOTE — Telephone Encounter (Signed)
Received Lantus renewal forms for [employer]. Filled out prescriber section. Placed in Dr Josefine Class box for MD signature.

## 2022-04-02 NOTE — Telephone Encounter (Signed)
Form placed in Dr. Carole Civil inbox to sign

## 2022-04-03 NOTE — Telephone Encounter (Signed)
Thanks.  I will work on it.

## 2022-04-04 ENCOUNTER — Ambulatory Visit (INDEPENDENT_AMBULATORY_CARE_PROVIDER_SITE_OTHER): Payer: Medicare Other | Admitting: Internal Medicine

## 2022-04-04 ENCOUNTER — Encounter: Payer: Self-pay | Admitting: Internal Medicine

## 2022-04-04 VITALS — BP 146/68 | HR 75 | Ht 72.0 in | Wt 186.6 lb

## 2022-04-04 DIAGNOSIS — J84112 Idiopathic pulmonary fibrosis: Secondary | ICD-10-CM

## 2022-04-04 DIAGNOSIS — I2510 Atherosclerotic heart disease of native coronary artery without angina pectoris: Secondary | ICD-10-CM

## 2022-04-04 DIAGNOSIS — F4321 Adjustment disorder with depressed mood: Secondary | ICD-10-CM | POA: Diagnosis not present

## 2022-04-04 DIAGNOSIS — J432 Centrilobular emphysema: Secondary | ICD-10-CM | POA: Diagnosis not present

## 2022-04-04 DIAGNOSIS — R0602 Shortness of breath: Secondary | ICD-10-CM

## 2022-04-04 DIAGNOSIS — Z5181 Encounter for therapeutic drug level monitoring: Secondary | ICD-10-CM | POA: Diagnosis not present

## 2022-04-04 LAB — PULMONARY FUNCTION TEST
DL/VA % pred: 47 %
DL/VA: 1.87 ml/min/mmHg/L
DLCO cor % pred: 36 %
DLCO cor: 9.71 ml/min/mmHg
DLCO unc % pred: 36 %
DLCO unc: 9.71 ml/min/mmHg
FEF 25-75 Pre: 1.36 L/sec
FEF2575-%Pred-Pre: 56 %
FEV1-%Pred-Pre: 77 %
FEV1-Pre: 2.57 L
FEV1FVC-%Pred-Pre: 93 %
FEV6-%Pred-Pre: 86 %
FEV6-Pre: 3.71 L
FEV6FVC-%Pred-Pre: 103 %
FVC-%Pred-Pre: 82 %
FVC-Pre: 3.79 L
Pre FEV1/FVC ratio: 68 %
Pre FEV6/FVC Ratio: 98 %

## 2022-04-04 LAB — CBC
HCT: 41.3 % (ref 39.0–52.0)
Hemoglobin: 14.3 g/dL (ref 13.0–17.0)
MCHC: 34.7 g/dL (ref 30.0–36.0)
MCV: 96.1 fl (ref 78.0–100.0)
Platelets: 293 10*3/uL (ref 150.0–400.0)
RBC: 4.29 Mil/uL (ref 4.22–5.81)
RDW: 12.9 % (ref 11.5–15.5)
WBC: 10.5 10*3/uL (ref 4.0–10.5)

## 2022-04-04 LAB — BASIC METABOLIC PANEL
BUN: 26 mg/dL — ABNORMAL HIGH (ref 6–23)
CO2: 28 mEq/L (ref 19–32)
Calcium: 9.7 mg/dL (ref 8.4–10.5)
Chloride: 98 mEq/L (ref 96–112)
Creatinine, Ser: 1.21 mg/dL (ref 0.40–1.50)
GFR: 58.58 mL/min — ABNORMAL LOW (ref >60.00)
Glucose, Bld: 296 mg/dL — ABNORMAL HIGH (ref 70–99)
Potassium: 4.2 mEq/L (ref 3.5–5.1)
Sodium: 134 mEq/L — ABNORMAL LOW (ref 135–145)

## 2022-04-04 LAB — BRAIN NATRIURETIC PEPTIDE: Pro B Natriuretic peptide (BNP): 63 pg/mL (ref 0.0–100.0)

## 2022-04-04 NOTE — Addendum Note (Signed)
Addended by: Lorretta Harp on: 04/04/2022 02:47 PM   Modules accepted: Orders

## 2022-04-04 NOTE — Progress Notes (Signed)
Spirometry and Dlco done today. 

## 2022-04-04 NOTE — Patient Instructions (Addendum)
ICD-10-CM   1. IPF (idiopathic pulmonary fibrosis) (HCC)  J84.112 Hepatic function panel    Hepatic function panel    2. Centrilobular emphysema ([place])  J43.2     3. 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[place])  J43.9    J84.10       Re IPF  - sstable on symptoms and CT > 1 year + as of NOv 2023  but PFT dlcop shows decline which could reflect cardiac or anemia issues - LFT normal Nov 2023 - noted wanted to hold off clinical trials right now  Plan - cotninue esbriet  at 2 pill thress times daily; take with food - check cbc, bmet, bnp 04/04/2022 = consider bone study for osteoporosis eval in Dec 2023 - clinical trial consideration in future - consider TETON study with inh - do spirom/dlco n 4 months    Emphysema -   Stable  Plan -continue spiriva respimat 2 puff daily  - continue  albuterol prn  Lung nodules right - cluster - new Aug 2023 - resolved NOv 2023  Plan  -reassure  Grief  Sorry for loss of dog  Followup -  - spriometyr and dlco in 16 weeks - Jovin Fester 30 min visit after spiro/dlco in 16 weeks but after PFT in 12 weeks   - symptom score and walk test at followup

## 2022-04-04 NOTE — Progress Notes (Signed)
OV 01/02/2021  Subjective:  Patient ID: Luke [name], male , DOB: [birth year] , age 76 y.o. , MRN: [id number] , ADDRESS: [place] [address] PCP Tonia Ghent, MD Patient Care Team: Tonia Ghent, MD as PCP - General Angelia Mould, MD (Vascular Surgery) Arta Silence, MD (Gastroenterology) Michael Boston, MD (General Surgery) Eustace Moore, MD (Neurosurgery) Juluis Rainier (Optometry) Jerline Pain, MD (Cardiology) [name], [facility] as Pharmacist (Pharmacist)  This Provider for this visit: Treatment Team:  Attending Provider: Brand Males, MD    01/02/2021 -transfer of care to the interstitial lung disease center for Dr. Chase Caller.  Referral from Dr. June Leap Chief Complaint  Patient presents with   Consult    ILD consult per Dr. Valeta Harms. Pt states he has been doing okay since last visit and denies any complaints.     HPI Luke [name] 76 y.o. -referred by Dr. June Leap for interstitial lung disease evaluation    Integrated Comprehensive ILD Questionnaire  Symptoms:  -Reports insidious onset of shortness of breath gradually.  It is the same since it started.  He is unclear how long he is headed but he had it for a few years.  There is no cough at all.  There is no clearing of the throat.  There is no fatigue.  His appetite is good no nausea no vomiting no diarrhea no anxiety no depression no chronic pain    Past Medical History :   In 2009 he was diagnosed with systolic heart failure.  He believes etiology is ischemic based on a remote heart attack.  Has a history of diabetes for several years.  Has kidney disease not otherwise specified for several years.  His GFR in May 2022 was greater than 60 with a creatinine 1.23 mg percent.   His last echocardiogram was in 2019.  He sees cardiology Dr. Caryl Comes and Dr. Candee Furbish.  Overall he is deemed to be stable.  He had pacemaker check recently.  Denies any  collagen vascular disease or vasculitis sleep apnea.  Denies any PE.  He has had COVID-vaccine but has not had COVID. ROS:   -Positive for fatigue.  Has some dysphagia for the last few days but otherwise okay.  No nausea no vomiting no heartburn no snoring no rash no ulcers.   FAMILY HISTORY of LUNG DISEASE: Denies   EXPOSURE HISTORY: Smokes cigarettes 20 1966 in 2012 30 cigarettes/day.  He did have some passive smoking.  Did smoke marijuana between 1968 and 1970.  Once a month.  No cocaine use no intravenous drug use   HOME and HOBBY DETAILS : Single-family [place] for the last 25 years the age of the home is 22 years.  There is no dampness.  No mildew no mold.  No humidifier use no CPAP use no nebulizer use.  No steam iron use no Jacuzzi use.  No misting [place] no pet birds or parakeets no pet gerbils.  No mold in the [facility] duct.  Last checked in 2009.  Does do gardening.  No birds at this no strong mats no water damage no Jacuzzi.   OCCUPATIONAL HISTORY (122 questions) : He was in [place] and got exposed to agent orange but otherwise detailed questioning is negative other than the fact he worked with some petroleum based cleaning agents.   PULMONARY TOXICITY HISTORY (27 items): He was on hydralazine in 2019.   HRCT May 2022 -  personally visualzied -definite progression is combined emphysema with UIP features.  IMPRESSION: 1. Spectrum of findings compatible with basilar predominant fibrotic interstitial lung disease without frank honeycombing, with mild progression at the lung bases since 03/25/2018 CT abdomen study, with more clear progression since 10/07/2011 CT abdomen study. Findings are categorized as probable UIP per consensus guidelines: Diagnosis of Idiopathic Pulmonary Fibrosis: An Official ATS/ERS/JRS/ALAT Clinical Practice Guideline. [place], Iss 5, [id number], Jan 04 2017. 2. Scattered small solid pulmonary nodules, largest 5 mm.  Follow-up noncontrast chest CT recommended in 12 months. This recommendation follows the consensus statement: Guidelines for Management of Incidental Pulmonary Nodules Detected on CT Images: From the Fleischner Society 2017; Radiology 2017; 284:228-243. 3. Three-vessel coronary atherosclerosis. 4. Chronic atrophy of pancreatic body and tail, progressive since 2019 CT abdomen study, without discrete mass on this noncontrast CT, indeterminate but presumably due to progressive chronic pancreatitis. 5. Aortic Atherosclerosis (ICD10-I70.0) and Emphysema (ICD10-J43.9).     Electronically Signed   By: Ilona Sorrel M.D.   On: 10/03/2020 16:53  SErology  Results for LEANORD, THIBEAU" (MRN [id number]) as of 01/02/2021 14:44  Ref. Range 11/13/2020 16:09  Anti Nuclear Antibody (ANA) Latest Ref Range: NEGATIVE  NEGATIVE  Angiotensin-Converting Enzyme Latest Ref Range: 9 - 67 U/L 27  Cyclic Citrullin Peptide Ab Latest Units: UNITS <16  ds DNA Ab Latest Units: IU/mL <1  Myeloperoxidase Abs Latest Units: AI <1.0  Serine Protease 3 Latest Units: AI <1.0  RA Latex Turbid. Latest Ref Range: <14 IU/mL <14  SSA (Ro) (ENA) Antibody, IgG Latest Ref Range: <1.0 NEG AI <1.0 NEG  SSB (La) (ENA) Antibody, IgG Latest Ref Range: <1.0 NEG AI <1.0 NEG  Scleroderma (Scl-70) (ENA) Antibody, IgG Latest Ref Range: <1.0 NEG AI <1.0 NEG    eCHO arch 2019  Study Conclusions   - Left ventricle: The cavity size was normal. Wall thickness was    normal. Systolic function was normal. The estimated ejection    fraction was in the range of 55% to 60%. Wall motion was normal;    there were no regional wall motion abnormalities. Doppler    parameters are consistent with abnormal left ventricular    relaxation (grade 1 diastolic dysfunction).  - Mitral valve: There was mild regurgitation.  - Pulmonary arteries: Systolic pressure was moderately increased.    PA peak pressure: 40 mm Hg (S).     OV 02/14/2021 -  telephine visit  Subjective:  Patient ID: Luke [name], male , DOB: 1945/06/09 , age 43 y.o. , MRN: [id number] , ADDRESS: [place] [address] PCP Tonia Ghent, MD Patient Care Team: Tonia Ghent, MD as PCP - General Angelia Mould, MD (Vascular Surgery) Arta Silence, MD (Gastroenterology) Michael Boston, MD (General Surgery) Eustace Moore, MD (Neurosurgery) Juluis Rainier [facility]) Jerline Pain, MD (Cardiology) [name], [facility] as Pharmacist (Pharmacist)   Type of visit: Telephone/Video Circumstance: COVID-19 national emergency Identification of patient OTHON GUARDIA with 1945-08-02 and MRN [id number] - 2 person identifier Risks: Risks, benefits, limitations of telephone visit explained. Patient understood and verbalized agreement to proceed Anyone else on call: just patient Patient location: [phone] -  This provider location: [place], [employer]  This Provider for this visit: Treatment Team:  Attending Provider: Brand Males, MD    02/14/2021 -  FU IPF with some emphysema. Has sCHF  HPI OTHAR CURTO 76 y.o. -telephone visit to  see how his esbriet started is going butt turns out he says he never got referred to pharmacist. Pharmacist confirmed that she is yet to meet iwht him. No esbriet started. Says even spirivan not started. Also, supposed to be telephone visit but got confusing call and he showed up at front desk only to be sent home. Apologized for gap in customer service. Overall well. He said after last vsit he read more about esbriet and got apprehensive esp in light of CKD but lab review shows GFR > 60 this year. 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  Instructions   - Slowly increase the dose per protocol  -Always take it with food  -Any nausea you can try ginger capsules  -Give at least between 5 and 6  hours between dosing  -Good idea to participate in [place] sponsored medication support program - this is optional  -Definitely apply sunscreen when you go out with this medication  - You will need monthly liver tests for 6 months and thereafter every 3 months  - explained in decreaesd renal functio that side ffects could be higher but his GFR is good enough for full dose  - explained benefit of slowing fibrosis progression and that NNT of 1 to 6 is pretty good   We resolved we will jsu start and titrate up to max dose of 2 pills tid which is 2/3rd normal dose   CT Chest data  No results found.  Results for ATHANASIUS, KESLING" (MRN [id number]) as of 02/14/2021 10:42  Ref. Range 10/23/2020 10:57  Creatinine Latest Ref Range: 0.76 - 1.27 mg/dL 1.17  Results for DALIN, CALDERA" (MRN [id number]) as of 02/14/2021 10:42  Ref. Range 01/02/2021 15:40  AST Latest Ref Range: 0 - 37 U/L 17  ALT Latest Ref Range: 0 - 53 U/L 16    PFT  No flowsheet data found.   OV 05/10/2021  Subjective:  Patient ID: Luke [name], male , DOB: [birth year] , age 61 y.o. , MRN: [id number] , ADDRESS: [place] [address] PCP Tonia Ghent, MD Patient Care Team: Tonia Ghent, MD as PCP - General Angelia Mould, MD (Vascular Surgery) Arta Silence, MD (Gastroenterology) Michael Boston, MD (General Surgery) Eustace Moore, MD (Neurosurgery) Juluis Rainier (Optometry) Jerline Pain, MD (Cardiology) [name], [facility] as Pharmacist (Pharmacist)  This Provider for this visit: Treatment Team:  Attending Provider: Brand Males, MD    05/10/2021 -   Chief Complaint  Patient presents with   Follow-up    PFT performed today.  Pt states he has been doing okay since last visit and denies any complaints.   02/14/2021 -  FU IPF with some emphysema. Has sCHF = - esbriet shipment at his home 03/23/21  - on esbiret 2 pills tid due to CKD hx  (ofev 2nd choice due to his heart issues)     HPI Luke [name] 76 y.o. -presents for follow-up.  Since his last visit he started pirfenidone.  After starting pirfenidone he developed COVID-19.  In the midst of all this he had to hold his COVID.  He also had diarrhea.  It was not fully clear in November 2022 whether the diarrhea was from [place] or from pirfenidone.  Nevertheless sometime around Thanksgiving he restarted pirfenidone.  He slowly escalated himself within a week to 2 pills 3 times  daily.  He is by choice at 2 pills 3 times daily because of a history of chronic kidney disease although most recent creatinine and GFR were greater than 60.  He says currently is doing 2 pills 3 times daily [submaximal but still therapeutic effect] and is tolerating this well without any GI side effects.  He does have chronic dry skin with flaking.  He says it is refractory.  We discussed about trying flaxseed.  He will do that.  We reviewed his first pulmonary function test.  Between the [facility] and DLCO when averaged he has 60% lung capacity.  He is doing Spiriva but is not sure it is helping him.  His most recent liver function test was 1 month ago.  He will have another 1 today.        OV 08/08/2021  Subjective:  Patient ID: Luke [name], male , DOB: Jul 25, 1945 , age 75 y.o. , MRN: [id number] , ADDRESS: [place] [address] PCP Tonia Ghent, MD Patient Care Team: Tonia Ghent, MD as PCP - General Angelia Mould, MD (Vascular Surgery) Arta Silence, MD (Gastroenterology) Michael Boston, MD (General Surgery) Eustace Moore, MD (Neurosurgery) Juluis Rainier (Optometry) Jerline Pain, MD (Cardiology) [name], [facility] as Pharmacist (Pharmacist)  This Provider for this visit: Treatment Team:  Attending Provider: Brand Males, MD    08/08/2021 -   Chief Complaint  Patient presents with   Follow-up    3 mo f/u for IPF. Stopped the Esbriet due to  increased dizziness combined with the isosorbide. Will resume in a few weeks.    02/14/2021 -  FU IPF with some emphysema. Has sCHF = - esbriet shipment at his home 03/23/21  - on esbiret 2 pills tid due to CKD hx (ofev 2nd choice due to his heart issues)  HPI Luke [name] 76 y.o. -returns for follow-up.  At this visit the original plan was to increase his pirfenidone but he tells me that while doing his daily exercises he started noticing chest pain on the treadmill.  This followed eye surgery in January 2023.  He had cardiac cath mid March 2023 and he had 100% blockage in one of his vessels.  His isosorbide was then increased.  He started feeling dizzy.  He attributed part of the dizziness due to pirfenidone.  He says pirfenidone was already giving him dizziness and this was made worse by the isosorbide.  He then decided to take his isosorbide half pill twice daily which helped his dizziness.  But he still was dizzy so he stopped his pirfenidone and the dizziness improved a lot.  He decided to protect his heart over his lung.  He has upcoming visit with cardiology in Sep 11, 2021.  Till then he wants to be on pirfenidone holiday.  After that he is definitely intending to restart the pirfenidone.  We discussed nintedanib and because of potential cardiac risk he does not want to do it.  We discussed clinical trials he is interested but he wants to rechallenge himself with pirfenidone first.  Currently he is walking 2 miles per day on a treadmill at 4 miles an hour without an incline and it takes 30 minutes.  Current symptom score is below.  Labs look okay .  The chest pain is improved  Regarding his dry skin: This is better after flaxseed.  He asked for some advice about reversing atherosclerosis.  I referred him to the [employer].  He understands this means strict vegetarian/vegan diet.  He admits readily that he is a carnivore.  In the last meat-based diet.   OV 10/03/2021  Subjective:   Patient ID: Luke [name], male , DOB: 1945/12/30 , age 5 y.o. , MRN: [id number] , ADDRESS: [place] [address] PCP Tonia Ghent, MD Patient Care Team: Tonia Ghent, MD as PCP - General Jerline Pain, MD as PCP - Cardiology (Cardiology) Angelia Mould, MD (Vascular Surgery) Arta Silence, MD (Gastroenterology) Michael Boston, MD (General Surgery) Eustace Moore, MD (Neurosurgery) Juluis Rainier (Optometry) Jerline Pain, MD (Cardiology) [name], [facility] as Pharmacist (Pharmacist)  This Provider for this visit: Treatment Team:  Attending Provider: Brand Males, MD  02/14/2021 -  FU IPF with some emphysema. Has sCHF = - esbriet shipment at his home 03/23/21  - on esbiret 2 pills tid due to CKD hx (ofev kept as 2nd choice due to his heart issues)  -  esbriet on hold since mid march 2023 due to dizziness made by worse by nitrate and improved after stopping esbriet  - restart late may 2023  10/03/2021 -   Chief Complaint  Patient presents with   Follow-up    Pt states he has been doing okay since last visit and denies any real complaints.     HPI Luke [name] 76 y.o. -returns for follow-up.  He says he is doing well.  The symptoms are minimal.  He continues to exercise.  He says he has difficulty tolerating medication adds.  He has not been stable on the isosorbide which gave him dizziness in the setting of pirfenidone.  He stopped the pirfenidone although he attributed the dizziness to the isosorbide.  After stopping pirfenidone his dizziness went away.  Now for the last 20 days he is also been on valsartan.  He believes it was a new start.  For the last 1 week he is now back on pirfenidone at 1 pill 3 times daily.  There are no side effects right now.  He is trying to go to 2 pills 3 times daily [the maximum dose and account of his CKD].  He is worried that he can get side effects such as fogginess and headaches.  But he is willing  to take it and monitor.  He is interested in clinical trials but at this point in time his is.  Is not on a stable dose.  His symptom score is stable His pulmonary function test shows stability/improvement in FVC.  Last CT scan of the chest May 2022     OV 01/10/2022  Subjective:  Patient ID: Luke [name], male , DOB: Jan 10, 1946 , age 65 y.o. , MRN: [id number] , ADDRESS: [place] [address] PCP Tonia Ghent, MD Patient Care Team: Tonia Ghent, MD as PCP - General Jerline Pain, MD as PCP - Cardiology (Cardiology) Angelia Mould, MD (Vascular Surgery) Arta Silence, MD (Gastroenterology) Michael Boston, MD (General Surgery) Eustace Moore, MD (Neurosurgery) Juluis Rainier (Optometry) Jerline Pain, MD (Cardiology) Charlton Haws, [facility] as Pharmacist (Pharmacist)  This Provider for this visit: Treatment Team:  Attending Provider: Brand Males, MD    01/10/2022 -   Chief Complaint  Patient presents with   Follow-up    PFT performed today.  Pt states he has been dong okay since last visit and denies any complaints.   HPI Luke [name] 76 y.o. -returns for  follow-up.  He continues to do well.  He is back on pirfenidone at 2 pills 3 times daily.  He does not want to escalate this further because of dizziness that he feels is because of valsartan and nitrates.  He has adjusted his valsartan and nitrate regimen but he still has dizziness.  With the pirfenidone he feels goofy.  He says the side effects overlapping but somewhat different.  He is able to differentiate the side effects between the 2 but certainly if he increases his pirfenidone he will get more dizzy.  His shortness of breath is stable his walking desaturation test is stable.  His pulmonary function test is stable.  His CT scan of the chest also shows stability and IPF over 1 year.  Of note he has some new nodules described in the right lower lobe in August 2023 CT chest.   A 33-monthfollow-up is recommended.  He had recent liver function test and that is normal.   CT Chest data - HRCT Aug 2023  Narrative & Impression  CLINICAL DATA:  Interstitial lung disease   EXAM: CT CHEST WITHOUT CONTRAST   TECHNIQUE: Multidetector CT imaging of the chest was performed following the standard protocol without intravenous contrast. High resolution imaging of the lungs, as well as inspiratory and expiratory imaging, was performed.   RADIATION DOSE REDUCTION: This exam was performed according to the departmental dose-optimization program which includes automated exposure control, adjustment of the mA and/or kV according to patient size and/or use of iterative reconstruction technique.   COMPARISON:  Chest CT dated Oct 03, 2020   FINDINGS: Cardiovascular: Normal heart size. No pericardial effusion. Normal caliber thoracic aorta with severe calcified plaque. Severe coronary artery calcifications of the LAD. Partially visualized left chest wall pacer wires with leads in the right atrium, right ventricle and lead overlying the left ventricle.   Mediastinum/Nodes: Esophagus and thyroid are unremarkable. No pathologically enlarged lymph nodes seen in the chest.   Lungs/Pleura: Central airways are patent. No significant air trapping. Severe centrilobular emphysema. Subpleural and basilar predominant reticular opacities with associated ground-glass, traction bronchiectasis. Honeycomb change is seen in the medial right lower lobe on series 12 image 199, not significantly changed when compared with prior exam. No definite evidence of progressive fibrosis. New clustered part solid nodules of the right lower lobe, largest nodule measures 17 mm in overall diameter with 6 mm solid component, located on series 12, image 261.   Upper Abdomen: No acute abnormality.   Musculoskeletal: No chest wall mass or suspicious bone lesions identified.   IMPRESSION: 1. Basilar  and subpleural predominant fibrotic interstitial lung disease with traction bronchiectasis and honeycomb change. No evidence of progression. Findings are consistent with UIP per consensus guidelines: Diagnosis of Idiopathic Pulmonary Fibrosis: An Official ATS/ERS/JRS/ALAT Clinical Practice Guideline. [place] Iss 5, [phone] Jan 04 2017. 2. New clustered part solid pulmonary nodules of the right lower lobe, likely due to infection or aspiration. Recommend follow-up chest CT in 3 months to ensure resolution. 3. Aortic Atherosclerosis (ICD10-I70.0) and Emphysema (ICD10-J43.9).     Electronically Signed   By: LYetta GlassmanM.D.   On: 12/07/2021 10:54    No results found.   OV 04/04/2022  Subjective:  Patient ID: WKarma [name] male , DOB: 606-15-1947, age 76y.o. , MRN: [id number], ADDRESS: [id number] DTonia Ghent MD Patient Care Team: DTonia Ghent MD as PCP - General  Jerline Pain, MD as PCP - Cardiology (Cardiology) Angelia Mould, MD (Vascular Surgery) Arta Silence, MD (Gastroenterology) Michael Boston, MD (General Surgery) Eustace Moore, MD (Neurosurgery) Juluis Rainier (Optometry) Jerline Pain, MD (Cardiology) Charlton Haws, [facility] as Pharmacist (Pharmacist)  This Provider for this visit: Treatment Team:  Attending Provider: Brand Males, MD     FU IPF with some emphysema. Has sCHF = - esbriet shipment at his home 03/23/21  - on esbiret 2 pills tid due to CKD hx (ofev kept as 2nd choice due to his heart issues)  -  esbriet on hold since mid march 2023 due to dizziness made by worse by nitrate and improved after stopping esbriet  - restart late may 2023  -Stable IPF on high-resolution CT chest in [facility] and symptoms November 2023 but DLCO declined   Pulmonary nodules resolved November 2023  04/04/2022 -   Chief Complaint  Patient presents with   Follow-up    PFT  performed today.  Pt states he has been doing okay since last visit and denies any complaints.     HPI Luke [name] 76 y.o. -returns for follow-up.  At this visit he had pulmonary function test that shows stability in FVC but his DLCO is decreased.  Symptom wise he feels stable.  He had a high-resolution CT chest that showed stability in the ILD for over a time.  He believes the DLCO decline is because of his heart failure because he feels good.  He is able to mow his lawn and raking leaves.  Based on the decline in DLCO he is open to getting his BNP and also CBC checked.  He also wants his kidney function checked.  He is compliant with his medications.  Of note his lung nodules have resolved on the CT chest. LFT normal Nov 2023  He continues on Esbriet 2 pills 3 times daily. 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 - 10 days ago he also fell from the ladder and sprained his ankle     SYMPTOM SCALE - ILD 01/02/2021  05/10/2021 191# 08/08/2021 188# -4 miles an hour on a treadmill for 30 minutes.  Covers 2 miles on no incline 10/03/2021  01/10/2022 187# 04/04/2022   O2 use ra ra ra ra ra ra  Shortness of Breath 0 -> 5 scale with 5 being worst (score 6 If unable to do)       At rest 0 0 0 0 0 0  Simple tasks - showers, clothes change, eating, shaving 0 0 0  0 0  Household (dishes, doing bed, laundry) 0 0 0 0 0 0  Shopping 0 0 0 0 0 0  Walking level at own pace 0 0 0 00 0 0  Walking up Stairs '2 1 2 2 2 4  '$ Total (30-36) Dyspnea Score '2 1 2 2 2 4  '$ How bad is your cough? 0 0 0 00 0 0  How bad is your fatigue 00 0 0 0 0 0  How bad is nausea 0 0 0 0 0 0  How bad is vomiting?  0 0 0 0  0  How bad is diarrhea? 00 0 0 0 0 0  How bad is anxiety? 0 0 0 0 00 0  How bad is depression 0 00 0 0 0 0  00      Simple office walk 185 feet x  3 laps goal with forehead probe  01/02/2021  08/08/2021  01/10/2022   O2 used ra ra ra  Number laps completed '3 3 2  '$ Comments about pace  avg    Resting Pulse Ox/HR 97% and 71/min 98% and HR 71 98% and HR66  Final Pulse Ox/HR 95% and 94/min 91% and HR 86 93% and HR 91  Desaturated </= 88% no no No  Desaturated <= 3% points no Yes, 7 poins Yes 5 ponts  Got Tachycardic >/= 90/min yes no yes  Symptoms at end of test none none none  Miscellaneous comments x      PFT     Latest Ref Rng & Units 04/04/2022    1:20 PM 01/10/2022    1:42 PM 10/02/2021    8:45 AM 05/10/2021   11:45 AM  PFT Results  FVC-Pre L 3.79  P 3.73  4.09  3.63   FVC-Predicted Pre % 82  P 81  88  78   Pre FEV1/FVC % % 68  P 69  68  70   FEV1-Pre L 2.57  P 2.56  2.77  2.55   FEV1-Predicted Pre % 77  P 77  82  75   DLCO uncorrected ml/min/mmHg 9.71  P 11.84  13.10  11.58   DLCO UNC% % 36  P 44  48  43   DLCO corrected ml/min/mmHg 9.71  P 11.84  13.45  11.36   DLCO COR %Predicted % 36  P 44  50  42   DLVA Predicted % 47  P 67  62  56     P Preliminary result    Narrative & Impression  CLINICAL DATA:  Interstitial lung disease, follow-up pulmonary nodule seen on prior imaging   EXAM: CT CHEST WITHOUT CONTRAST   TECHNIQUE: Multidetector CT imaging of the chest was performed following the standard protocol without intravenous contrast. High resolution imaging of the lungs, as well as inspiratory and expiratory imaging, was performed.   RADIATION DOSE REDUCTION: This exam was performed according to the departmental dose-optimization program which includes automated exposure control, adjustment of the mA and/or kV according to patient size and/or use of iterative reconstruction technique.   COMPARISON:  Multiple priors, most recent chest CT dated August 3rd 2023   FINDINGS: Cardiovascular: Normal heart size. No pericardial effusion. Normal caliber thoracic aorta with moderate atherosclerotic disease. Left chest wall biventricular pacer with leads in the right atrium, right ventricle and vein overlying the left ventricle. Moderate  coronary artery calcifications.   Mediastinum/Nodes: Small hiatal hernia. Mildly enlarged mediastinal lymph nodes unchanged when compared with prior exam and likely reactive.   Lungs/Pleura: Central airways are patent. No significant air trapping. Severe centrilobular emphysema. Lower lung and subpleural predominant reticular opacities with associated traction bronchiectasis and honeycomb change. No evidence of progressive fibrosis. Previously described right lower lobe pulmonary nodules have resolved. Small 4 mm right upper lobe pulmonary nodule located on series 10, image 27 unchanged in size when compared with prior exams dating back to Oct 03, 2020.   Upper Abdomen: No acute abnormality.   Musculoskeletal: No chest wall mass or suspicious bone lesions identified.   IMPRESSION: 1. Right lower lobe lower lobe pulmonary nodules are no longer present, compatible with resolved infectious or inflammatory process. 2. Subpleural and lower lung predominant fibrotic interstitial lung disease with traction bronchiectasis and honeycomb change. No evidence of progression. Findings are consistent with UIP per consensus guidelines: Diagnosis of Idiopathic Pulmonary Fibrosis: An Official ATS/ERS/JRS/ALAT Clinical Practice Guideline. [place]  Med Vol 198, Iss 5, [id number], Jan 04 2017. 3. Aortic Atherosclerosis (ICD10-I70.0) and Emphysema (ICD10-J43.9).     Electronically Signed   By: Yetta Glassman M.D.   On: 03/29/2022 18:11     has a past medical history of AAA (abdominal aortic aneurysm) ([place]) (2011), Anxiety, Atrial fibrillation ([place]), Automatic implantable cardioverter-defibrillator in situ, CAD (coronary artery disease), Cardiomyopathy ([place]), Diabetes mellitus, Drug therapy, Ejection fraction < 50%, Ejection fraction < 50%, Fall due to ice or snow (Feb.  19, 2015), HLD (hyperlipidemia), Hypertension, ICD (implantable cardioverter-defibrillator) in place, LBBB (left  bundle branch block), Low testosterone, Myocardial infarction [facility]), Pacemaker, PAD (peripheral artery disease) ([place]), and Pancreatitis.   reports that he quit smoking about 17 years ago. His smoking use included cigarettes. He started smoking about 59 years ago. He has a 80.00 pack-year smoking history. He has never used smokeless tobacco.  Past Surgical History:  Procedure Laterality Date   ABDOMINAL AORTAGRAM N/A 10/28/2011   Procedure: ABDOMINAL [name];  Surgeon: Angelia Mould, MD;  Location: [facility] CATH LAB;  Service: Cardiovascular;  Laterality: N/A;   ABDOMINAL AORTIC ANEURYSM REPAIR     EVAR    BI-VENTRICULAR IMPLANTABLE CARDIOVERTER DEFIBRILLATOR N/A 07/16/2012   Procedure: BI-VENTRICULAR IMPLANTABLE CARDIOVERTER DEFIBRILLATOR  (CRT-D);  Surgeon: Deboraha Sprang, MD;  Location: [facility] CATH LAB;  Service: Cardiovascular;  Laterality: N/A;   BIV ICD GENERATOR CHANGEOUT N/A 10/25/2020   Procedure: BIV ICD GENERATOR CHANGEOUT;  Surgeon: Deboraha Sprang, MD;  Location: [place] CV LAB;  Service: Cardiovascular;  Laterality: N/A;   CARDIAC CATHETERIZATION     COLONOSCOPY  03/23/2018; 2020   ESOPHAGOGASTRODUODENOSCOPY N/A 02/12/2019   Procedure: ESOPHAGOGASTRODUODENOSCOPY (EGD);  Surgeon: Jackquline Denmark, MD;  Location: [facility] ENDOSCOPY;  Service: Endoscopy;  Laterality: N/A;   ESOPHAGOGASTRODUODENOSCOPY (EGD) WITH PROPOFOL N/A 02/05/2018   Procedure: ESOPHAGOGASTRODUODENOSCOPY (EGD) WITH PROPOFOL;  Surgeon: Mauri Pole, MD;  Location: [facility] ENDOSCOPY;  Service: Endoscopy;  Laterality: N/A;   ESOPHAGOGASTRODUODENOSCOPY (EGD) WITH PROPOFOL N/A 09/27/2018   Procedure: ESOPHAGOGASTRODUODENOSCOPY (EGD) WITH PROPOFOL;  Surgeon: Carol Ada, MD;  Location: [place];  Service: Endoscopy;  Laterality: N/A;   FOREIGN BODY REMOVAL  09/27/2018   Procedure: FOREIGN BODY REMOVAL;  Surgeon: Carol Ada, MD;  Location: [facility] ENDOSCOPY;  Service: Endoscopy;;   FOREIGN BODY REMOVAL  02/12/2019    Procedure: FOREIGN BODY REMOVAL;  Surgeon: Jackquline Denmark, MD;  Location: [facility] ENDOSCOPY;  Service: Endoscopy;;   HERNIA REPAIR  Jan. 9, 2015   INSERTION OF MESH N/A 05/14/2013   Procedure: INSERTION OF MESH;  Surgeon: Adin Hector, MD;  Location: [place];  Service: General;  Laterality: N/A;   LEFT HEART CATH AND CORONARY ANGIOGRAPHY N/A 07/17/2021   Procedure: LEFT HEART CATH AND CORONARY ANGIOGRAPHY;  Surgeon: Troy Sine, MD;  Location: [place] CV LAB;  Service: Cardiovascular;  Laterality: N/A;   LOWER EXTREMITY ANGIOGRAM Bilateral 10/28/2011   Procedure: LOWER EXTREMITY ANGIOGRAM;  Surgeon: Angelia Mould, MD;  Location[name] CATH LAB;  Service: Cardiovascular;  Laterality: Bilateral;   PACEMAKER INSERTION  07-16-12   pacemaker/defibrilator   POLYPECTOMY     POSTERIOR CERVICAL FUSION/FORAMINOTOMY N/A 06/09/2014   Procedure: Laminectomy - Cervical two-Cervcial four posterior cervical instrumented fusion Cervical two-cervical four;  Surgeon: Eustace Moore, MD;  Location: [place] NEURO ORS;  Service: Neurosurgery;  Laterality: N/A;  posterior    SAVORY DILATION N/A 02/05/2018   Procedure: SAVORY DILATION;  Surgeon: Mauri Pole, MD;  Location: [facility] ENDOSCOPY;  Service: Endoscopy;  Laterality:  N/A;   TONSILLECTOMY     as a child    UMBILICAL HERNIA REPAIR N/A 05/14/2013   Procedure: LAPAROSCOPIC exploration and repair of hernia in abdominal ;  Surgeon: Adin Hector, MD;  Location: [place];  Service: General;  Laterality: N/A;   UPPER GASTROINTESTINAL ENDOSCOPY  2020    Allergies  Allergen Reactions   Aldactone [Spironolactone] Other (See Comments)    Hyperkalemia   Codeine Rash and Hives   Atorvastatin     Myalgias with lipitor.  Does tolerate simvastatin.     Januvia [Sitagliptin] Other (See Comments)    Diarrhea and heart racing   Jardiance [Empagliflozin] Other (See Comments)    Polyuria; excessive weight loss   Lisinopril     Possible cause of pancreatitis     Immunization History  Administered Date(s) Administered   Fluad Quad(high Dose 65+) 02/23/2020, 04/13/2021   Influenza Split 01/31/2011, 02/11/2012   Influenza Whole 02/27/2010   Influenza,inj,Quad PF,6+ Mos 03/18/2013, 03/25/2014, 01/19/2015, 02/21/2016, 02/27/2017, 02/12/2018, 02/18/2019   Influenza-Unspecified 03/03/2020   PFIZER(Purple Top)SARS-COV-2 Vaccination 05/23/2019, 06/12/2019   Pneumococcal Conjugate-13 10/18/2014   Pneumococcal Polysaccharide-23 11/14/2011   Td 07/05/2010    Family History  Problem Relation Age of Onset   Hypertension Mother    Stroke Mother    Hyperlipidemia Mother    Diabetes Sister    Heart disease Sister        Before age 85   Hypertension Sister    Hyperlipidemia Sister    Heart attack Sister    Lung cancer Father    Hypertension Son    Colon cancer Neg Hx    Prostate cancer Neg Hx    Esophageal cancer Neg Hx    Stomach cancer Neg Hx    Rectal cancer Neg Hx    Colon polyps Neg Hx      Current Outpatient Medications:    albuterol (VENTOLIN HFA) 108 (90 Base) MCG/ACT inhaler, Inhale 2 puffs into the lungs every 6 (six) hours as needed for wheezing or shortness of breath., Disp: 8 g, Rfl: 6   amLODipine (NORVASC) 10 MG tablet, TAKE 1 TABLET(10 MG) BY MOUTH DAILY, Disp: 90 tablet, Rfl: 3   aspirin EC 81 MG tablet, Take 81 mg by mouth every evening., Disp: , Rfl:    carvedilol (COREG) 25 MG tablet, TAKE 1 TABLET BY MOUTH TWICE DAILY WITH MEALS, Disp: 180 tablet, Rfl: 3   Cholecalciferol (VITAMIN D3) 10 MCG (400 UNIT) CAPS, Take 1 capsule by mouth daily., Disp: , Rfl:    ESBRIET 267 MG TABS, Take 2 tablets (534 mg total) by mouth in the morning, at noon, and at bedtime. **NOTE LOW DOSE AS MAINTENANCE**, Disp: 540 tablet, Rfl: 1   glipiZIDE (GLUCOTROL) 5 MG tablet, TAKE 1 TABLET(5 MG) BY MOUTH TWICE DAILY BEFORE A MEAL, Disp: 180 tablet, Rfl: 1   glucose blood (ACCU-CHEK AVIVA PLUS) test strip, AS DIRECTED BLOOD GLUCOSE TWICE DAILY, Disp:  200 strip, Rfl: 3   insulin glargine (LANTUS SOLOSTAR) 100 UNIT/ML Solostar Pen, INJECT 0.3 TO 0.35 MLS(30 TO 35 UNITS) INTO THE SKIN EVERY DAY, Disp: 15 mL, Rfl: 3   isosorbide mononitrate (IMDUR) 30 MG 24 hr tablet, Take 2 tablets (60 mg total) by mouth daily., Disp: 180 tablet, Rfl: 3   metFORMIN (GLUCOPHAGE) 500 MG tablet, TAKE 2 TABLETS BY MOUTH TWICE DAILY WITH FOOD, Disp: 360 tablet, Rfl: 3   Multiple Vitamin (MULTIVITAMIN WITH MINERALS) TABS tablet, Take 1 tablet by mouth every evening., Disp: ,  Rfl:    omeprazole (PRILOSEC) 40 MG capsule, TAKE 1 CAPSULE(40 MG) BY MOUTH TWICE DAILY, Disp: 180 capsule, Rfl: 0   simvastatin (ZOCOR) 40 MG tablet, TAKE 1 TABLET BY MOUTH AT BEDTIME, Disp: 90 tablet, Rfl: 3   umeclidinium bromide (INCRUSE ELLIPTA) 62.5 MCG/ACT AEPB, Inhale 1 puff into the lungs daily., Disp: 90 each, Rfl: 3   valsartan (DIOVAN) 40 MG tablet, Take 1 tablet (40 mg total) by mouth daily., Disp: 30 tablet, Rfl: 11   vitamin B-12 (CYANOCOBALAMIN) 1000 MCG tablet, Take 1,000 mcg by mouth in the morning., Disp: , Rfl:       Objective:   Vitals:   04/04/22 1344  BP: (!) 146/68  Pulse: 75  SpO2: 99%  Weight: 186 lb 9.6 oz (84.6 kg)  Height: 6' (1.829 m)    Estimated body mass index is 25.31 kg/m as calculated from the following:   Height as of this encounter: 6' (1.829 m).   Weight as of this encounter: 186 lb 9.6 oz (84.6 kg).  '@WEIGHTCHANGE'$ @  Filed Weights   04/04/22 1344  Weight: 186 lb 9.6 oz (84.6 kg)     Physical Exam    General: No distress. Looks well Neuro: Alert and Oriented x 3. GCS 15. Speech normal Psych: Pleasant Resp:  Barrel Chest - no.  Wheeze - no, Crackles - yes bae, No overt respiratory distress CVS: Normal heart sounds. Murmurs - no Ext: Stigmata of Connective Tissue Disease - no HEENT: Normal upper airway. PEERL +. No post nasal drip        Assessment:       ICD-10-CM   1. IPF (idiopathic pulmonary fibrosis) (HCC)  J84.112 CBC     2. Centrilobular emphysema ([place])  J43.2     3. Medication monitoring encounter  Z51.81     4. Grief  F43.21          Plan:     Patient Instructions     ICD-10-CM   1. IPF (idiopathic pulmonary fibrosis) (HCC)  J84.112 Hepatic function panel    Hepatic function panel    2. Centrilobular emphysema ([place])  J43.2     3. 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[place])  J43.9    J84.10       Re IPF  - sstable on symptoms and CT > 1 year + as of NOv 2023  but PFT dlcop shows decline which could reflect cardiac or anemia issues - LFT normal Nov 2023 - noted wanted to hold off clinical trials right now  Plan - cotninue esbriet  at 2 pill thress times daily; take with food - check cbc, bmet, bnp 04/04/2022 = consider bone study for osteoporosis eval in Dec 2023 - clinical trial consideration in future - consider TETON study with inh - do spirom/dlco n 4 months    Emphysema -   Stable  Plan -continue spiriva respimat 2 puff daily  - continue  albuterol prn  Lung nodules right - cluster - new Aug 2023 - resolved NOv 2023  Plan  -reassure  Grief  Sorry for loss of dog  Followup -  - spriometyr and dlco in 16 weeks - [place] 30 min visit after spiro/dlco in 16 weeks but after PFT in 12 weeks   - symptom score and walk test at followup  High complex medical condition requiring high risk prescription with intensive therapeutic monitoring requirement   SIGNATURE    Dr. Brand Males, M.D., F.C.C.P,  Pulmonary and Critical Care Medicine  Staff Physician, [place] Director - Interstitial Lung Disease  Program  Pulmonary [place] at [place], [place], [address]  Pager: [phone], If no answer or between  15:00h - 7:00h: call [phone] Telephone: [phone]  2:40 PM 04/04/2022

## 2022-04-08 ENCOUNTER — Ambulatory Visit (INDEPENDENT_AMBULATORY_CARE_PROVIDER_SITE_OTHER): Payer: Medicare Other | Admitting: Podiatry

## 2022-04-08 DIAGNOSIS — E0843 Diabetes mellitus due to underlying condition with diabetic autonomic (poly)neuropathy: Secondary | ICD-10-CM | POA: Diagnosis not present

## 2022-04-08 DIAGNOSIS — L97502 Non-pressure chronic ulcer of other part of unspecified foot with fat layer exposed: Secondary | ICD-10-CM | POA: Diagnosis not present

## 2022-04-08 NOTE — Progress Notes (Signed)
Chief Complaint  Patient presents with   Wound Check    Wound check right foot patient states it is painful , constant pain with drainage    Foot Injury     Patient states he fell on a ladder and sprain his foot. States he did not go see anybody , there is no pain but he wants to make sure his foot is ok    Subjective:  76 y.o. male with PMHx of diabetes mellitus presenting today for recurrence of the ulcer to the plantar aspect of the first MTP of the right foot.  Patient states that he has been dealing with a callus/ulcer to the area since 2018.  He says that most recently there was a callus that developed and it has since become an ulcer.  Patient also states that a few weeks ago he did sprain his left foot but the pain has since resolved.  He has no pain or tenderness to the area.   Past Medical History:  Diagnosis Date   AAA (abdominal aortic aneurysm) ([place]) 2011   Per vascular surgery   Anxiety    no med. in use for anxiety but pt. speaks openly of his stress & anxious feelings regarding impending surgery     Atrial fibrillation ([place])    Automatic implantable cardioverter-defibrillator in situ    CAD (coronary artery disease)    Presumed CAD with nuclear scan October 09, 2011,  large anteroseptal MI and inferior MI. Catheterization scheduled October 15, 2011   Cardiomyopathy [facility])    Nuclear, October 09, 2011, EF 30%, multiple focal wall motion abnormalities   Diabetes mellitus    type II   Drug therapy    Hyperkalemia with spironolactone   Ejection fraction < 50%    EF 30%, nuclear, October 09, 2011   Ejection fraction < 50%    EF previously 30%  //   EF 30-35%, echo, July 14, 2012, severe diffuse hypokinesis, PA pressure 43 mm mercury   Fall due to ice or snow Feb.  19, 2015   HLD (hyperlipidemia)    Hypertension    white coat HTN-- often elevated in office and controlled on outside checks.   ICD (implantable cardioverter-defibrillator) in place    CRT-D placed March, 2014  complete heart block and k dysfunction   LBBB (left bundle branch block)    LBBB on EKG October 11, 2011,  no prior EKG has been done   Low testosterone    Hx of   Myocardial infarction ([place])    Told in 2015-never knew-showed up on stress test for AAA;    Pacemaker    PAD (peripheral artery disease) ([place])    Pancreatitis     Past Surgical History:  Procedure Laterality Date   ABDOMINAL AORTAGRAM N/A 10/28/2011   Procedure: ABDOMINAL [name];  Surgeon: Angelia Mould, MD;  Location: [facility] CATH LAB;  Service: Cardiovascular;  Laterality: N/A;   ABDOMINAL AORTIC ANEURYSM REPAIR     EVAR    BI-VENTRICULAR IMPLANTABLE CARDIOVERTER DEFIBRILLATOR N/A 07/16/2012   Procedure: BI-VENTRICULAR IMPLANTABLE CARDIOVERTER DEFIBRILLATOR  (CRT-D);  Surgeon: Deboraha Sprang, MD;  Location: [facility] CATH LAB;  Service: Cardiovascular;  Laterality: N/A;   BIV ICD GENERATOR CHANGEOUT N/A 10/25/2020   Procedure: BIV ICD GENERATOR CHANGEOUT;  Surgeon: Deboraha Sprang, MD;  Location: [place] CV LAB;  Service: Cardiovascular;  Laterality: N/A;   CARDIAC CATHETERIZATION     COLONOSCOPY  03/23/2018; 2020   ESOPHAGOGASTRODUODENOSCOPY N/A 02/12/2019  Procedure: ESOPHAGOGASTRODUODENOSCOPY (EGD);  Surgeon: Jackquline Denmark, MD;  Location[facility] ENDOSCOPY;  Service: Endoscopy;  Laterality: N/A;   ESOPHAGOGASTRODUODENOSCOPY (EGD) WITH PROPOFOL N/A 02/05/2018   Procedure: ESOPHAGOGASTRODUODENOSCOPY (EGD) WITH PROPOFOL;  Surgeon: Mauri Pole, MD;  Location: [facility] ENDOSCOPY;  Service: Endoscopy;  Laterality: N/A;   ESOPHAGOGASTRODUODENOSCOPY (EGD) WITH PROPOFOL N/A 09/27/2018   Procedure: ESOPHAGOGASTRODUODENOSCOPY (EGD) WITH PROPOFOL;  Surgeon: Carol Ada, MD;  Location: [place];  Service: Endoscopy;  Laterality: N/A;   FOREIGN BODY REMOVAL  09/27/2018   Procedure: FOREIGN BODY REMOVAL;  Surgeon: Carol Ada, MD;  Location: [facility] ENDOSCOPY;  Service: Endoscopy;;   FOREIGN BODY REMOVAL  02/12/2019   Procedure: FOREIGN  BODY REMOVAL;  Surgeon: Jackquline Denmark, MD;  Location: [facility] ENDOSCOPY;  Service: Endoscopy;;   HERNIA REPAIR  Jan. 9, 2015   INSERTION OF MESH N/A 05/14/2013   Procedure: INSERTION OF MESH;  Surgeon: Adin Hector, MD;  Location: [place];  Service: General;  Laterality: N/A;   LEFT HEART CATH AND CORONARY ANGIOGRAPHY N/A 07/17/2021   Procedure: LEFT HEART CATH AND CORONARY ANGIOGRAPHY;  Surgeon: Troy Sine, MD;  Location: [place] CV LAB;  Service: Cardiovascular;  Laterality: N/A;   LOWER EXTREMITY ANGIOGRAM Bilateral 10/28/2011   Procedure: LOWER EXTREMITY ANGIOGRAM;  Surgeon: Angelia Mould, MD;  Location: [facility] CATH LAB;  Service: Cardiovascular;  Laterality: Bilateral;   PACEMAKER INSERTION  07-16-12   pacemaker/defibrilator   POLYPECTOMY     POSTERIOR CERVICAL FUSION/FORAMINOTOMY N/A 06/09/2014   Procedure: Laminectomy - Cervical two-Cervcial four posterior cervical instrumented fusion Cervical two-cervical four;  Surgeon: Eustace Moore, MD;  Location: [place] NEURO ORS;  Service: Neurosurgery;  Laterality: N/A;  posterior    SAVORY DILATION N/A 02/05/2018   Procedure: SAVORY DILATION;  Surgeon: Mauri Pole, MD;  [facility] ENDOSCOPY;  Service: Endoscopy;  Laterality: N/A;   TONSILLECTOMY     as a child    UMBILICAL HERNIA REPAIR N/A 05/14/2013   Procedure: LAPAROSCOPIC exploration and repair of hernia in abdominal ;  Surgeon: Adin Hector, MD;  Location: [place];  Service: General;  Laterality: N/A;   UPPER GASTROINTESTINAL ENDOSCOPY  2020    Allergies  Allergen Reactions   Aldactone [Spironolactone] Other (See Comments)    Hyperkalemia   Codeine Rash and Hives   Atorvastatin     Myalgias with lipitor.  Does tolerate simvastatin.     Januvia [Sitagliptin] Other (See Comments)    Diarrhea and heart racing   Jardiance [Empagliflozin] Other (See Comments)    Polyuria; excessive weight loss   Lisinopril     Possible cause of pancreatitis     Objective/Physical  Exam General: The patient is alert and oriented x3 in no acute distress.  Dermatology:  Wound #1 noted to the plantar aspect of the first MTP right foot measuring approximately 1.0 x 1.0 x 0.3 cm (LxWxD).  The ulcer directly underlies the sesamoidal apparatus of the right foot  To the noted ulceration(s), there is no eschar. There is a moderate amount of slough, fibrin, and necrotic tissue noted. Granulation tissue and wound base is red. There is a minimal amount of serosanguineous drainage noted. There is no exposed bone muscle-tendon ligament or joint. There is no malodor. Periwound integrity is intact. Skin is warm, dry and supple bilateral lower extremities.  Vascular: Palpable pedal pulses bilaterally. No edema or erythema noted. Capillary refill within normal limits.  Clinically there is no indication of vascular compromise  Neurological: Light touch and protective threshold  diminished bilaterally.   Musculoskeletal Exam: No pedal deformity.  No prior amputations.  Muscle strength 5/5 all compartments  Assessment: 1.  Ulcer plantar aspect of the first MTP right secondary to diabetes mellitus 2. diabetes mellitus w/ peripheral neuropathy 3.  Foot sprain left; resolved   Plan of Care:  1. Patient was evaluated. 2. medically necessary excisional debridement including subcutaneous tissue was performed using a tissue nipper and a chisel blade. Excisional debridement of all the necrotic nonviable tissue down to healthy bleeding viable tissue was performed with post-debridement measurements same as pre-. 3. the wound was cleansed and dry sterile dressing applied. 4.  Patient has been applying bacitracin to the area.  Continue with a light dressing daily 5.  Offloading felt dancers pads were applied to the insoles in the shoes to offload pressure from the foot and wound 6.  The patient has not dealt with this for about 5 years now intermittently.  I do believe surgery is considered at this  time however I would like for the wound to improve/resolve prior to considering surgery which would likely include excision of the sesamoid.  This was explained to the patient. 7.  Return to clinic in 4 weeks for follow-up evaluation and for possible surgical consultation.  Patient will also need updated x-rays with marker placed around the ulcerative lesion   Edrick Kins, DPM [facility]  Dr. Edrick Kins, DPM    [address], [place] [address]                Office [phone]  Fax [phone]

## 2022-04-11 ENCOUNTER — Encounter: Payer: Medicare Other | Admitting: Internal Medicine

## 2022-04-16 ENCOUNTER — Encounter: Payer: Self-pay | Admitting: Family Medicine

## 2022-04-16 ENCOUNTER — Ambulatory Visit (INDEPENDENT_AMBULATORY_CARE_PROVIDER_SITE_OTHER): Payer: Medicare Other | Admitting: Family Medicine

## 2022-04-16 VITALS — BP 140/70 | HR 71 | Temp 97.7°F | Ht 72.0 in | Wt 187.0 lb

## 2022-04-16 DIAGNOSIS — I10 Essential (primary) hypertension: Secondary | ICD-10-CM | POA: Diagnosis not present

## 2022-04-16 DIAGNOSIS — I714 Abdominal aortic aneurysm, without rupture, unspecified: Secondary | ICD-10-CM

## 2022-04-16 DIAGNOSIS — I2510 Atherosclerotic heart disease of native coronary artery without angina pectoris: Secondary | ICD-10-CM

## 2022-04-16 DIAGNOSIS — E1159 Type 2 diabetes mellitus with other circulatory complications: Secondary | ICD-10-CM

## 2022-04-16 DIAGNOSIS — Z7189 Other specified counseling: Secondary | ICD-10-CM

## 2022-04-16 DIAGNOSIS — E7800 Pure hypercholesterolemia, unspecified: Secondary | ICD-10-CM

## 2022-04-16 DIAGNOSIS — J84112 Idiopathic pulmonary fibrosis: Secondary | ICD-10-CM

## 2022-04-16 DIAGNOSIS — Z0000 Encounter for general adult medical examination without abnormal findings: Secondary | ICD-10-CM

## 2022-04-16 LAB — LIPID PANEL
Cholesterol: 153 mg/dL (ref 0–200)
HDL: 34.4 mg/dL — ABNORMAL LOW (ref >39.00)
NonHDL: 119.04
Total CHOL/HDL Ratio: 4
Triglycerides: 346 mg/dL — ABNORMAL HIGH (ref 0.0–149.0)
VLDL: 69.2 mg/dL — ABNORMAL HIGH (ref 0.0–40.0)

## 2022-04-16 LAB — HEMOGLOBIN A1C: Hgb A1c MFr Bld: 7.9 % — ABNORMAL HIGH (ref 4.6–6.5)

## 2022-04-16 LAB — MICROALBUMIN / CREATININE URINE RATIO
Creatinine,U: 24.5 mg/dL
Microalb Creat Ratio: 8.4 mg/g (ref 0.0–30.0)
Microalb, Ur: 2 mg/dL — ABNORMAL HIGH (ref 0.0–1.9)

## 2022-04-16 LAB — LDL CHOLESTEROL, DIRECT: Direct LDL: 75 mg/dL

## 2022-04-16 MED ORDER — ISOSORBIDE MONONITRATE ER 30 MG PO TB24: 30.0000 mg | ORAL_TABLET | Freq: Every day | ORAL | 3 refills | Status: DC

## 2022-04-16 MED ORDER — VALSARTAN 40 MG PO TABS: 20.0000 mg | ORAL_TABLET | Freq: Every day | ORAL | Status: DC

## 2022-04-16 NOTE — Progress Notes (Unsigned)
I have personally reviewed the Medicare Annual Wellness questionnaire and have noted 1. The patient's medical and social history 2. Their use of alcohol, tobacco or illicit drugs 3. Their current medications and supplements 4. The patient's functional ability including ADL's, fall risks, home safety risks and hearing or visual             impairment. 5. Diet and physical activities 6. Evidence for depression or mood disorders  The patients weight, height, BMI have been recorded in the chart and visual acuity is per eye clinic.  I have made referrals, counseling and provided education to the patient based review of the above and I have provided the pt with a written personalized care plan for preventive services.  Provider list updated- see scanned forms.  Routine anticipatory guidance given to patient.  See health maintenance. The possibility exists that previously documented standard health maintenance information may have been brought forward from a previous encounter into this note.  If needed, that same information has been updated to reflect the current situation based on today's encounter.    Flu Shingles PNA Tetanus Colon  Breast cancer screening Prostate cancer screening Advance directive Cognitive function addressed- see scanned forms- and if abnormal then additional documentation follows.   In addition to [facility] Wellness, follow up visit for the below conditions:  Skinned his knee and bumped L ankle a few weeks ago when a ladder slid out from underneath him.  He is still walking on the treadmill.  Sugar had been elevated in the interval.    Diabetes:  Using medications without difficulties: yes Hypoglycemic episodes: down to 73 this AM.  No symptoms.   Hyperglycemic episodes:no Feet problems: at baseline, some paresthesia.  H/o plantar ulcer.   Blood Sugars averaging: 130-170 at night.   eye exam within last year: Dr. Idolina Primer, seen this year.    AAA per vascular surgery.    Hypertension:    Using medication without problems or lightheadedness:  he can get lightheaded/off balance.  D/w pt about cutting valsartan in half.   Chest pain with exertion: only with sig exertion.  Carried 26 bags of leaves to the street. Burping helps.  Edema:no Short of breath: only with sig exertion.   Average home BP: controlled, [id number] systolic at home.    Elevated Cholesterol: Using medications without problems: yes Muscle aches: some cramping, possibly from statin, but not from the last few nights.  Not exertional.   Diet compliance: yes Exercise: yes  IPF.  Still on esbriet at baseline with incruse.  Per pulmonary.    Had ICD but no discharge.    PMH and SH reviewed  Meds, vitals, and allergies reviewed.   ROS: Per HPI.  Unless specifically indicated otherwise in HPI, the patient denies:  General: fever. Eyes: acute vision changes ENT: sore throat Cardiovascular: chest pain Respiratory: SOB GI: vomiting GU: dysuria Musculoskeletal: acute back pain Derm: acute rash Neuro: acute motor dysfunction Psych: worsening mood Endocrine: polydipsia Heme: bleeding Allergy: hayfever  GEN: nad, alert and oriented HEENT: mucous membranes moist NECK: supple w/o LA CV: rrr. PULM: ctab, no inc wob ABD: soft, +bs EXT: no edema SKIN: no acute rash  Diabetic foot exam: Normal inspection except as below No skin breakdown Callous with central ulcer R foot, doesn't appear infected.   dec DP pulses dec sensation to light tough and monofilament Nails thickened

## 2022-04-16 NOTE — Patient Instructions (Addendum)
Go to the lab on the way out.   If you have mychart we'll likely use that to update you.    Take care.  Glad to see you. Please call about follow up with [facility] at [facility].  If the cramps are worse, then let me know.   I would try the lower dose of valsartan in the meantime and see if you feel better.

## 2022-04-17 NOTE — Assessment & Plan Note (Signed)
Per vascular surgery.

## 2022-04-17 NOTE — Assessment & Plan Note (Signed)
Still on esbriet at baseline with incruse.  Per pulmonary.

## 2022-04-17 NOTE — Assessment & Plan Note (Signed)
Advance directive- wife designated if patient were incapacitated.  

## 2022-04-17 NOTE — Assessment & Plan Note (Signed)
History of whitecoat hypertension.  Improved/controlled on home check.  Continue Diovan at 20 mg.  He had been occasionally lightheaded/off-balance.  Lowering the dose may help.  Continue isosorbide 30 mg along with carvedilol and amlodipine.

## 2022-04-17 NOTE — Assessment & Plan Note (Addendum)
Recheck labs today.  Continue insulin metformin and glipizide.  Continue work on diet and exercise.  Discussed with patient about footcare.  See exam.

## 2022-04-17 NOTE — Assessment & Plan Note (Signed)
He had some cramping that could possibly be statin related.  He does not have exertional symptoms/claudication.  Would continue simvastatin for now.

## 2022-04-17 NOTE — Assessment & Plan Note (Signed)
Flu 2023 Shingles discussed with patient PNA previously done Tetanus 2012, discussed with patient. COVID-vaccine previously done Colon cancer screening 2019 Prostate cancer screening deferred. Advance directive-wife designated if patient were incapacitated. Cognitive function addressed- see scanned forms- and if abnormal then additional documentation follows.

## 2022-04-18 ENCOUNTER — Telehealth: Payer: Self-pay

## 2022-04-18 NOTE — Chronic Care Management (AMB) (Signed)
Contacted [employer] regarding application for Lantus renewal for 2024. Per manufacturer application received 01/11/10 and patient is approved for 2024.next shipment of Lantus  120ds to [facility] early March 2024.  Charlene Brooke, CPP notified  Avel Sensor, [place]  [phone]

## 2022-04-20 ENCOUNTER — Other Ambulatory Visit: Payer: Self-pay | Admitting: Family Medicine

## 2022-04-22 ENCOUNTER — Ambulatory Visit (INDEPENDENT_AMBULATORY_CARE_PROVIDER_SITE_OTHER): Payer: Medicare Other

## 2022-04-22 ENCOUNTER — Telehealth: Payer: Self-pay

## 2022-04-22 VITALS — Ht 72.0 in | Wt 187.0 lb

## 2022-04-22 DIAGNOSIS — Z0000 Encounter for general adult medical examination without abnormal findings: Secondary | ICD-10-CM

## 2022-04-22 NOTE — Patient Outreach (Signed)
  Care Coordination   Initial Visit Note   04/22/2022 Name: Luke [name] MRN: [id number] DOB: 09/16/1945  Luke [name] is a 76 y.o. year old male who sees Tonia Ghent, MD for primary care. I spoke with  Luke [name] by phone today.  What matters to the patients health and wellness today?  Patient states he does not have any nursing or community resource needs at this time.  He states he has several health conditions but feels they are being managed and under control.  Patient states he follows up with his providers every 2 months.     Goals Addressed             This Visit's Progress    Care coordination activities- no follow up needed       Care Coordination Interventions: Care coordination program/ services discussed  Social determinants of health survey completed Patient advised to contact primary care provider office or RNCM if care coordination services needed in the future         SDOH assessments and interventions completed:  Yes  SDOH Interventions Today    Flowsheet Row Most Recent Value  SDOH Interventions   Food Insecurity Interventions Intervention Not Indicated  Housing Interventions Intervention Not Indicated  Transportation Interventions Intervention Not Indicated        Care Coordination Interventions:  Yes, provided   Follow up plan: No further intervention required.   Encounter Outcome:  Pt. Visit Completed   Quinn Plowman RN,BSN,CCM [place] [phone] direct line

## 2022-04-22 NOTE — Patient Instructions (Signed)
Luke [name] , Thank you for taking time to come for your Medicare Wellness Visit. I appreciate your ongoing commitment to your health goals. Please review the following plan we discussed and let me know if I can assist you in the future.   Screening recommendations/referrals: Colonoscopy: 10/13/19, every 3 years Recommended yearly ophthalmology/optometry visit for glaucoma screening and checkup Recommended yearly dental visit for hygiene and checkup  Vaccinations: Influenza vaccine: 04/13/21, pt states had this year already Pneumococcal vaccine: 10/18/14 Tdap vaccine: 07/05/10, due if have injury Shingles vaccine: n/d   Covid-19: 05/23/19, 06/12/19, 02/08/20, 10/09/20, 08/02/21  Advanced directives: no  Conditions/risks identified: none  Next appointment: Follow up in one year for your annual wellness visit. 04/24/23 @ 11 am by phone  Preventive Care 65 Years and Older, Male Preventive care refers to lifestyle choices and visits with your health care provider that can promote health and wellness. What does preventive care include? A yearly physical exam. This is also called an annual well check. Dental exams once or twice a year. Routine eye exams. Ask your health care provider how often you should have your eyes checked. Personal lifestyle choices, including: Daily care of your teeth and gums. Regular physical activity. Eating a healthy diet. Avoiding tobacco and drug use. Limiting alcohol use. Practicing safe sex. Taking low doses of aspirin every day. Taking vitamin and mineral supplements as recommended by your health care provider. What happens during an annual well check? The services and screenings done by your health care provider during your annual well check will depend on your age, overall health, lifestyle risk factors, and family history of disease. Counseling  Your health care provider may ask you questions about your: Alcohol use. Tobacco use. Drug use. Emotional  well-being. Home and relationship well-being. Sexual activity. Eating habits. History of falls. Memory and ability to understand (cognition). Work and work Statistician. Screening  You may have the following tests or measurements: Height, weight, and BMI. Blood pressure. Lipid and cholesterol levels. These may be checked every 5 years, or more frequently if you are over 19 years old. Skin check. Lung cancer screening. You may have this screening every year starting at age 72 if you have a 30-pack-year history of smoking and currently smoke or have quit within the past 15 years. Fecal occult blood test (FOBT) of the stool. You may have this test every year starting at age 21. Flexible sigmoidoscopy or colonoscopy. You may have a sigmoidoscopy every 5 years or a colonoscopy every 10 years starting at age 16. Prostate cancer screening. Recommendations will vary depending on your family history and other risks. Hepatitis C blood test. Hepatitis B blood test. Sexually transmitted disease (STD) testing. Diabetes screening. This is done by checking your blood sugar (glucose) after you have not eaten for a while (fasting). You may have this done every 1-3 years. Abdominal aortic aneurysm (AAA) screening. You may need this if you are a current or former smoker. Osteoporosis. You may be screened starting at age 79 if you are at high risk. Talk with your health care provider about your test results, treatment options, and if necessary, the need for more tests. Vaccines  Your health care provider may recommend certain vaccines, such as: Influenza vaccine. This is recommended every year. Tetanus, diphtheria, and acellular pertussis (Tdap, Td) vaccine. You may need a Td booster every 10 years. Zoster vaccine. You may need this after age 9. Pneumococcal 13-valent conjugate (PCV13) vaccine. One dose is recommended after age 40. Pneumococcal  polysaccharide (PPSV23) vaccine. One dose is recommended after  age 40. Talk to your health care provider about which screenings and vaccines you need and how often you need them. This information is not intended to replace advice given to you by your health care provider. Make sure you discuss any questions you have with your health care provider. Document Released: 05/19/2015 Document Revised: 01/10/2016 Document Reviewed: 02/21/2015 Elsevier Interactive Patient Education  2017 [place] Prevention in the Home Falls can cause injuries. They can happen to people of all ages. There are many things you can do to make your home safe and to help prevent falls. What can I do on the outside of my home? Regularly fix the edges of walkways and driveways and fix any cracks. Remove anything that might make you trip as you walk through a door, such as a raised step or threshold. Trim any bushes or trees on the path to your home. Use bright outdoor lighting. Clear any walking paths of anything that might make someone trip, such as rocks or tools. Regularly check to see if handrails are loose or broken. Make sure that both sides of any steps have handrails. Any raised decks and porches should have guardrails on the edges. Have any leaves, snow, or ice cleared regularly. Use sand or salt on walking paths during winter. Clean up any spills in your garage right away. This includes oil or grease spills. What can I do in the bathroom? Use night lights. Install grab bars by the toilet and in the tub and shower. Do not use towel bars as grab bars. Use non-skid mats or decals in the tub or shower. If you need to sit down in the shower, use a plastic, non-slip stool. Keep the floor dry. Clean up any water that spills on the floor as soon as it happens. Remove soap buildup in the tub or shower regularly. Attach bath mats securely with double-sided non-slip rug tape. Do not have throw rugs and other things on the floor that can make you trip. What can I do in the  bedroom? Use night lights. Make sure that you have a light by your bed that is easy to reach. Do not use any sheets or blankets that are too big for your bed. They should not hang down onto the floor. Have a firm chair that has side arms. You can use this for support while you get dressed. Do not have throw rugs and other things on the floor that can make you trip. What can I do in the kitchen? Clean up any spills right away. Avoid walking on wet floors. Keep items that you use a lot in easy-to-reach places. If you need to reach something above you, use a strong step stool that has a grab bar. Keep electrical cords out of the way. Do not use floor polish or wax that makes floors slippery. If you must use wax, use non-skid floor wax. Do not have throw rugs and other things on the floor that can make you trip. What can I do with my stairs? Do not leave any items on the stairs. Make sure that there are handrails on both sides of the stairs and use them. Fix handrails that are broken or loose. Make sure that handrails are as long as the stairways. Check any carpeting to make sure that it is firmly attached to the stairs. Fix any carpet that is loose or worn. Avoid having throw rugs at the top  or bottom of the stairs. If you do have throw rugs, attach them to the floor with carpet tape. Make sure that you have a light switch at the top of the stairs and the bottom of the stairs. If you do not have them, ask someone to add them for you. What else can I do to help prevent falls? Wear shoes that: Do not have high heels. Have rubber bottoms. Are comfortable and fit you well. Are closed at the toe. Do not wear sandals. If you use a stepladder: Make sure that it is fully opened. Do not climb a closed stepladder. Make sure that both sides of the stepladder are locked into place. Ask someone to hold it for you, if possible. Clearly mark and make sure that you can see: Any grab bars or  handrails. First and last steps. Where the edge of each step is. Use tools that help you move around (mobility aids) if they are needed. These include: Canes. Walkers. Scooters. Crutches. Turn on the lights when you go into a dark area. Replace any light bulbs as soon as they burn out. Set up your furniture so you have a clear path. Avoid moving your furniture around. If any of your floors are uneven, fix them. If there are any pets around you, be aware of where they are. Review your medicines with your doctor. Some medicines can make you feel dizzy. This can increase your chance of falling. Ask your doctor what other things that you can do to help prevent falls. This information is not intended to replace advice given to you by your health care provider. Make sure you discuss any questions you have with your health care provider. Document Released: 02/16/2009 Document Revised: 09/28/2015 Document Reviewed: 05/27/2014 Elsevier Interactive Patient Education  2017 [employer].

## 2022-04-22 NOTE — Progress Notes (Signed)
Virtual Visit via Telephone Note  I connected with  Luke [name] on 04/22/22 at  2:00 PM EST by telephone and verified that I am speaking with the correct person using two identifiers.  Location: Patient: home Provider: [place] Persons participating in the virtual visit: [place]   I discussed the limitations, risks, security and privacy concerns of performing an evaluation and management service by telephone and the availability of in person appointments. The patient expressed understanding and agreed to proceed.  Interactive audio and video telecommunications were attempted between this nurse and patient, however failed, due to patient having technical difficulties OR patient did not have access to video capability.  We continued and completed visit with audio only.  Some vital signs may be absent or patient reported.   Dionisio David, LPN  Subjective:   Luke [name] is a 76 y.o. male who presents for Medicare Annual/Subsequent preventive examination.  Review of Systems     Cardiac Risk Factors include: advanced age (>4mn, >>17women);diabetes mellitus;dyslipidemia;hypertension;male gender     Objective:    Today's Vitals   04/22/22 1417  Weight: 187 lb (84.8 kg)  Height: 6' (1.829 m)   Body mass index is 25.36 kg/m.     04/22/2022    2:06 PM 08/21/2021    7:28 AM 08/20/2021    8:24 PM 10/25/2020    9:11 AM 02/12/2019    4:05 PM 02/12/2019    9:45 AM 09/27/2018    8:36 AM  Advanced Directives  Does Patient Have a Medical Advance Directive? No Yes No Yes Yes Yes No  Type of ACorporate treasurerof [place] will   Living will Living will   Does patient want to make changes to medical advance directive?  No - Patient declined  No - Patient declined  Yes (ED - Information included in AVS)   Copy of [place]?  No - copy requested       Would patient like information on creating a medical  advance directive? No - Patient declined      No - Patient declined    Current Medications (verified) Outpatient Encounter Medications as of 04/22/2022  Medication Sig   amLODipine (NORVASC) 10 MG tablet TAKE 1 TABLET(10 MG) BY MOUTH DAILY   aspirin EC 81 MG tablet Take 81 mg by mouth every evening.   carvedilol (COREG) 25 MG tablet TAKE 1 TABLET BY MOUTH TWICE DAILY WITH MEALS   Cholecalciferol (VITAMIN D3) 10 MCG (400 UNIT) CAPS Take 1 capsule by mouth daily.   ESBRIET 267 MG TABS Take 2 tablets (534 mg total) by mouth in the morning, at noon, and at bedtime. **NOTE LOW DOSE AS MAINTENANCE**   glipiZIDE (GLUCOTROL) 5 MG tablet TAKE 1 TABLET(5 MG) BY MOUTH TWICE DAILY BEFORE A MEAL   glucose blood (ACCU-CHEK AVIVA PLUS) test strip AS DIRECTED TO CHECK BLOOD SUGAR TWICE DAILY   insulin glargine (LANTUS SOLOSTAR) 100 UNIT/ML Solostar Pen INJECT 0.3 TO 0.35 MLS(30 TO 35 UNITS) INTO THE SKIN EVERY DAY   isosorbide mononitrate (IMDUR) 30 MG 24 hr tablet Take 1 tablet (30 mg total) by mouth daily.   metFORMIN (GLUCOPHAGE) 500 MG tablet TAKE 2 TABLETS BY MOUTH TWICE DAILY WITH FOOD   Multiple Vitamin (MULTIVITAMIN WITH MINERALS) TABS tablet Take 1 tablet by mouth every evening.   omeprazole (PRILOSEC) 40 MG capsule TAKE 1 CAPSULE(40 MG) BY MOUTH TWICE DAILY   simvastatin (ZOCOR) 40  MG tablet TAKE 1 TABLET BY MOUTH AT BEDTIME   umeclidinium bromide (INCRUSE ELLIPTA) 62.5 MCG/ACT AEPB Inhale 1 puff into the lungs daily.   valsartan (DIOVAN) 40 MG tablet Take 0.5-1 tablets (20-40 mg total) by mouth daily.   vitamin B-12 (CYANOCOBALAMIN) 1000 MCG tablet Take 1,000 mcg by mouth in the morning.   albuterol (VENTOLIN HFA) 108 (90 Base) MCG/ACT inhaler Inhale 2 puffs into the lungs every 6 (six) hours as needed for wheezing or shortness of breath. (Patient not taking: Reported on 04/22/2022)   No facility-administered encounter medications on file as of 04/22/2022.    Allergies (verified) Aldactone  [spironolactone], Codeine, Atorvastatin, Januvia [sitagliptin], Jardiance [empagliflozin], and Lisinopril   History: Past Medical History:  Diagnosis Date   AAA (abdominal aortic aneurysm) ([place]) 2011   Per vascular surgery   Anxiety    no med. in use for anxiety but pt. speaks openly of his stress & anxious feelings regarding impending surgery     Atrial fibrillation ([place])    Automatic implantable cardioverter-defibrillator in situ    CAD (coronary artery disease)    Presumed CAD with nuclear scan October 09, 2011,  large anteroseptal MI and inferior MI. Catheterization scheduled October 15, 2011   Cardiomyopathy [facility])    Nuclear, October 09, 2011, EF 30%, multiple focal wall motion abnormalities   Diabetes mellitus    type II   Drug therapy    Hyperkalemia with spironolactone   Ejection fraction < 50%    EF 30%, nuclear, October 09, 2011   Ejection fraction < 50%    EF previously 30%  //   EF 30-35%, echo, July 14, 2012, severe diffuse hypokinesis, PA pressure 43 mm mercury   Fall due to ice or snow Feb.  19, 2015   HLD (hyperlipidemia)    Hypertension    white coat HTN-- often elevated in office and controlled on outside checks.   ICD (implantable cardioverter-defibrillator) in place    CRT-D placed March, 2014 complete heart block and k dysfunction   LBBB (left bundle branch block)    LBBB on EKG October 11, 2011,  no prior EKG has been done   Low testosterone    Hx of   Myocardial infarction ([place])    Told in 2015-never knew-showed up on stress test for AAA;    Pacemaker    PAD (peripheral artery disease) ([place])    Pancreatitis    Past Surgical History:  Procedure Laterality Date   ABDOMINAL AORTAGRAM N/A 10/28/2011   Procedure: ABDOMINAL [name];  Surgeon: Angelia Mould, MD;  Location: [facility] CATH LAB;  Service: Cardiovascular;  Laterality: N/A;   ABDOMINAL AORTIC ANEURYSM REPAIR     EVAR    BI-VENTRICULAR IMPLANTABLE CARDIOVERTER DEFIBRILLATOR N/A 07/16/2012   Procedure:  BI-VENTRICULAR IMPLANTABLE CARDIOVERTER DEFIBRILLATOR  (CRT-D);  Surgeon: Deboraha Sprang, MD;  Location: [facility] CATH LAB;  Service: Cardiovascular;  Laterality: N/A;   BIV ICD GENERATOR CHANGEOUT N/A 10/25/2020   Procedure: BIV ICD GENERATOR CHANGEOUT;  Surgeon: Deboraha Sprang, MD;  Location: [place] CV LAB;  Service: Cardiovascular;  Laterality: N/A;   CARDIAC CATHETERIZATION     COLONOSCOPY  03/23/2018; 2020   ESOPHAGOGASTRODUODENOSCOPY N/A 02/12/2019   Procedure: ESOPHAGOGASTRODUODENOSCOPY (EGD);  Surgeon: Jackquline Denmark, MD;  Location: [facility] ENDOSCOPY;  Service: Endoscopy;  Laterality: N/A;   ESOPHAGOGASTRODUODENOSCOPY (EGD) WITH PROPOFOL N/A 02/05/2018   Procedure: ESOPHAGOGASTRODUODENOSCOPY (EGD) WITH PROPOFOL;  Surgeon: Mauri Pole, MD;  Location: [facility] ENDOSCOPY;  Service: Endoscopy;  Laterality: N/A;  ESOPHAGOGASTRODUODENOSCOPY (EGD) WITH PROPOFOL N/A 09/27/2018   Procedure: ESOPHAGOGASTRODUODENOSCOPY (EGD) WITH PROPOFOL;  Surgeon: Carol Ada, MD;  Location: [place];  Service: Endoscopy;  Laterality: N/A;   FOREIGN BODY REMOVAL  09/27/2018   Procedure: FOREIGN BODY REMOVAL;  Surgeon: Carol Ada, MD;  Location: [facility] ENDOSCOPY;  Service: Endoscopy;;   FOREIGN BODY REMOVAL  02/12/2019   Procedure: FOREIGN BODY REMOVAL;  Surgeon: Jackquline Denmark, MD;  Location[facility] ENDOSCOPY;  Service: Endoscopy;;   HERNIA REPAIR  Jan. 9, 2015   INSERTION OF MESH N/A 05/14/2013   Procedure: INSERTION OF MESH;  Surgeon: Adin Hector, MD;  Location: [place];  Service: General;  Laterality: N/A;   LEFT HEART CATH AND CORONARY ANGIOGRAPHY N/A 07/17/2021   Procedure: LEFT HEART CATH AND CORONARY ANGIOGRAPHY;  Surgeon: Troy Sine, MD;  Location: [place] CV LAB;  Service: Cardiovascular;  Laterality: N/A;   LOWER EXTREMITY ANGIOGRAM Bilateral 10/28/2011   Procedure: LOWER EXTREMITY ANGIOGRAM;  Surgeon: Angelia Mould, MD;  Location: [facility] CATH LAB;  Service: Cardiovascular;  Laterality: Bilateral;    PACEMAKER INSERTION  07-16-12   pacemaker/defibrilator   POLYPECTOMY     POSTERIOR CERVICAL FUSION/FORAMINOTOMY N/A 06/09/2014   Procedure: Laminectomy - Cervical two-Cervcial four posterior cervical instrumented fusion Cervical two-cervical four;  Surgeon: Eustace Moore, MD;  Location: [place] NEURO ORS;  Service: Neurosurgery;  Laterality: N/A;  posterior    SAVORY DILATION N/A 02/05/2018   Procedure: SAVORY DILATION;  Surgeon: Mauri Pole, MD;  Location: [facility] ENDOSCOPY;  Service: Endoscopy;  Laterality: N/A;   TONSILLECTOMY     as a child    UMBILICAL HERNIA REPAIR N/A 05/14/2013   Procedure: LAPAROSCOPIC exploration and repair of hernia in abdominal ;  Surgeon: Adin Hector, MD;  Location: [place];  Service: General;  Laterality: N/A;   UPPER GASTROINTESTINAL ENDOSCOPY  2020   Family History  Problem Relation Age of Onset   Hypertension Mother    Stroke Mother    Hyperlipidemia Mother    Diabetes Sister    Heart disease Sister        Before age 8   Hypertension Sister    Hyperlipidemia Sister    Heart attack Sister    Lung cancer Father    Hypertension Son    Colon cancer Neg Hx    Prostate cancer Neg Hx    Esophageal cancer Neg Hx    Stomach cancer Neg Hx    Rectal cancer Neg Hx    Colon polyps Neg Hx    Social History   Socioeconomic History   Marital status: Married    Spouse name: Patti   Number of children: 1   Years of education: Not on file   Highest education level: Not on file  Occupational History   Occupation: retired  Tobacco Use   Smoking status: Former    Packs/day: 2.00    Years: 40.00    Total pack years: 80.00    Types: Cigarettes    Start date: 1964    Quit date: 05/06/2004    Years since quitting: 17.9   Smokeless tobacco: Never  Vaping Use   Vaping Use: Never used  Substance and Sexual Activity   Alcohol use: Not Currently   Drug use: No   Sexual activity: Yes    Partners: Female  Other Topics Concern   Not on file  Social History  Narrative   [place] vet, he has known service related agent orange exposure.    Retired  Exercises daily.     Social Determinants of Health   Financial Resource Strain: Low Risk  (04/22/2022)   Overall Financial Resource Strain (CARDIA)    Difficulty of Paying Living Expenses: Not hard at all  Food Insecurity: No Food Insecurity (04/22/2022)   Hunger Vital Sign    Worried About Running Out of Food in the Last Year: Never true    Ran Out of Food in the Last Year: Never true  Transportation Needs: No Transportation Needs (04/22/2022)   PRAPARE - Hydrologist (Medical): No    Lack of Transportation (Non-Medical): No  Physical Activity: Sufficiently Active (04/22/2022)   Exercise Vital Sign    Days of Exercise per Week: 6 days    Minutes of Exercise per Session: 60 min  Stress: No Stress Concern Present (04/22/2022)   [place]    Feeling of Stress : Not at all  Social Connections: Moderately Isolated (04/22/2022)   Social Connection and Isolation Panel [NHANES]    Frequency of Communication with Friends and Family: More than three times a week    Frequency of Social Gatherings with Friends and Family: Twice a week    Attends Religious Services: Never    Marine scientist or Organizations: No    Attends Music therapist: Never    Marital Status: Married    Tobacco Counseling Counseling given: Not Answered   Clinical Intake:  Pre-visit preparation completed: Yes  Pain : No/denies pain     Nutritional Risks: None Diabetes: Yes CBG done?: No Did pt. bring in CBG monitor from home?: No  How often do you need to have someone help you when you read instructions, pamphlets, or other written materials from your doctor or pharmacy?: 1 - Never  Diabetic?yes Nutrition Risk Assessment:  Has the patient had any N/V/D within the last 2 months?  No  Does the patient  have any non-healing wounds?  No  Has the patient had any unintentional weight loss or weight gain?  No   Diabetes:  Is the patient diabetic?  Yes  If diabetic, was a CBG obtained today?  No  Did the patient bring in their glucometer from home?  No  How often do you monitor your CBG's? Twice/day.   Financial Strains and Diabetes Management:  Are you having any financial strains with the device, your supplies or your medication? No .  Does the patient want to be seen by Chronic Care Management for management of their diabetes?  No  Would the patient like to be referred to a Nutritionist or for Diabetic Management?  No   Diabetic Exams:  Diabetic Eye Exam: Completed 12/11/20. Pt has been advised about the importance in completing this exam.   Diabetic Foot Exam: Completed 04/16/22. Pt has been advised about the importance in completing this exam.     Interpreter Needed?: No  Information entered by :: Kirke Shaggy, LPN   Activities of Daily Living    04/22/2022    2:07 PM 08/21/2021   11:00 PM  In your present state of health, do you have any difficulty performing the following activities:  Hearing? 0 0  Vision? 0 0  Difficulty concentrating or making decisions? 0 0  Walking or climbing stairs? 0 0  Dressing or bathing? 0 0  Doing errands, shopping? 0 0  Preparing Food and eating ? N   Using the Toilet? N   In the  past six months, have you accidently leaked urine? N   Do you have problems with loss of bowel control? N   Managing your Medications? N   Managing your Finances? N   Housekeeping or managing your Housekeeping? N     Patient Care Team: Tonia Ghent, MD as PCP - General Jerline Pain, MD as PCP - Cardiology (Cardiology) Angelia Mould, MD (Vascular Surgery) Arta Silence, MD (Gastroenterology) Michael Boston, MD (General Surgery) Eustace Moore, MD (Neurosurgery) Idolina Primer Warnell Bureau (Optometry) Jerline Pain, MD (Cardiology) Charlton Haws,  [facility] as Pharmacist (Pharmacist)  Indicate any recent Medical Services you may have received from other than [facility] providers in the past year (date may be approximate).     Assessment:   This is a routine wellness examination for Luke [name].  Hearing/Vision screen Hearing Screening - Comments:: No aids Vision Screening - Comments:: Wears glasses- Dr.Dunn   Dietary issues and exercise activities discussed: Current Exercise Habits: Home exercise routine, Type of exercise: treadmill (and bike), Time (Minutes): 60, Frequency (Times/Week): 6, Weekly Exercise (Minutes/Week): 360   Goals Addressed             This Visit's Progress    DIET - EAT MORE FRUITS AND VEGETABLES         Depression Screen    04/22/2022    2:04 PM 04/16/2022    9:58 AM 04/13/2021    3:51 PM 03/14/2020    2:28 PM 02/18/2019    9:12 AM 12/08/2017   12:55 PM 11/19/2016    8:53 AM  PHQ 2/9 Scores  PHQ - 2 Score 0 0 0 0 0 0 0  PHQ- 9 Score 0   0       Fall Risk    04/22/2022    2:06 PM 04/13/2021    3:49 PM 03/14/2020    2:28 PM 02/18/2019    9:12 AM 12/08/2017    1:11 PM  [place] in the past year? 0 0 0 0 Yes  Number falls in past yr: 0 0 0 0 1  Injury with Fall? 0 0 0 0 No  Risk for fall due to : History of fall(s) No Fall Risks   Medication side effect  Follow up Falls prevention discussed;Falls evaluation completed Falls evaluation completed   Falls prevention discussed;Education provided    FALL RISK PREVENTION PERTAINING TO THE HOME:  Any stairs in or around the home? No  If so, are there any without handrails? No  Home free of loose throw rugs in walkways, pet beds, electrical cords, etc? Yes  Adequate lighting in your home to reduce risk of falls? Yes   ASSISTIVE DEVICES UTILIZED TO PREVENT FALLS:  Life alert? No  Use of a cane, walker or w/c? No  Grab bars in the bathroom? Yes  Shower chair or bench in shower? No  Elevated toilet seat or a handicapped toilet? No   Cognitive  Function:    11/19/2016    8:55 AM 11/14/2015    8:40 AM  MMSE - Mini Mental State Exam  Orientation to time 5 5  Orientation to Place 5 5  Registration 3 3  Attention/ Calculation 0 0  Recall 3 3  Language- name 2 objects 0 0  Language- repeat 1 1  Language- follow 3 step command 3 3  Language- read & follow direction 0 0  Write a sentence 0 0  Copy design 0 0  Total score 20 20  04/22/2022    2:17 PM  6CIT Screen  What Year? 0 points  What month? 0 points  What time? 0 points    Immunizations Immunization History  Administered Date(s) Administered   Fluad Quad(high Dose 65+) 02/23/2020, 04/13/2021   Influenza Split 01/31/2011, 02/11/2012   Influenza Whole 02/27/2010   Influenza, High Dose Seasonal PF 02/08/2022   Influenza,inj,Quad PF,6+ Mos 03/18/2013, 03/25/2014, 01/19/2015, 02/21/2016, 02/27/2017, 02/12/2018, 02/18/2019   Influenza-Unspecified 03/03/2020   PFIZER(Purple Top)SARS-COV-2 Vaccination 05/23/2019, 06/12/2019, 02/08/2020, 10/09/2020   Pfizer Covid-19 Vaccine Bivalent Booster 63yr & up 08/02/2021   Pneumococcal Conjugate-13 10/18/2014   Pneumococcal Polysaccharide-23 11/14/2011   Td 07/05/2010    TDAP status: Due, Education has been provided regarding the importance of this vaccine. Advised may receive this vaccine at local pharmacy or Health Dept. Aware to provide a copy of the vaccination record if obtained from local pharmacy or Health Dept. Verbalized acceptance and understanding.  Flu Vaccine status: Up to date  Pneumococcal vaccine status: Up to date  Covid-19 vaccine status: Completed vaccines  Qualifies for Shingles Vaccine? Yes   Zostavax completed No   Shingrix Completed?: No.    Education has been provided regarding the importance of this vaccine. Patient has been advised to call insurance company to determine out of pocket expense if they have not yet received this vaccine. Advised may also receive vaccine at local pharmacy or  Health Dept. Verbalized acceptance and understanding.  Screening Tests Health Maintenance  Topic Date Due   Zoster Vaccines- Shingrix (1 of 2) Never done   DTaP/Tdap/Td (2 - Tdap) 07/04/2020   OPHTHALMOLOGY EXAM  12/11/2021   COVID-19 Vaccine (6 - 2023-24 season) 05/02/2022 (Originally 01/04/2022)   COLONOSCOPY (Pts 45-425yrInsurance coverage will need to be confirmed)  10/13/2022   HEMOGLOBIN A1C  10/16/2022   Diabetic kidney evaluation - eGFR measurement  04/05/2023   Diabetic kidney evaluation - Urine ACR  04/17/2023   FOOT EXAM  04/17/2023   Medicare Annual Wellness (AWV)  04/23/2023   Pneumonia Vaccine 6570Years old  Completed   INFLUENZA VACCINE  Completed   Hepatitis C Screening  Completed   HPV VACCINES  Aged Out    Health Maintenance  Health Maintenance Due  Topic Date Due   Zoster Vaccines- Shingrix (1 of 2) Never done   DTaP/Tdap/Td (2 - Tdap) 07/04/2020   OPHTHALMOLOGY EXAM  12/11/2021    Colorectal cancer screening: Type of screening: Colonoscopy. Completed 10/13/19. Repeat every 3 years  Lung Cancer Screening: (Low Dose CT Chest recommended if Age 75-80ears, 30 pack-year currently smoking OR have quit w/in 15years.) does not qualify.   Additional Screening:  Hepatitis C Screening: does qualify; Completed 05/17/15  Vision Screening: Recommended annual ophthalmology exams for early detection of glaucoma and other disorders of the eye. Is the patient up to date with their annual eye exam?  Yes  Who is the provider or what is the name of the office in which the patient attends annual eye exams? Dr.Dunn If pt is not established with a provider, would they like to be referred to a provider to establish care? No .   Dental Screening: Recommended annual dental exams for proper oral hygiene  Community Resource Referral / Chronic Care Management: CRR required this visit?  No   CCM required this visit?  No      Plan:     I have personally reviewed and noted the  following in the patient's chart:   Medical and social history Use  of alcohol, tobacco or illicit drugs  Current medications and supplements including opioid prescriptions. Patient is not currently taking opioid prescriptions. Functional ability and status Nutritional status Physical activity Advanced directives List of other physicians Hospitalizations, surgeries, and ER visits in previous 12 months Vitals Screenings to include cognitive, depression, and falls Referrals and appointments  In addition, I have reviewed and discussed with patient certain preventive protocols, quality metrics, and best practice recommendations. A written personalized care plan for preventive services as well as general preventive health recommendations were provided to patient.     Dionisio David, LPN   [id number]   Nurse Notes: none

## 2022-04-24 ENCOUNTER — Ambulatory Visit (INDEPENDENT_AMBULATORY_CARE_PROVIDER_SITE_OTHER): Payer: Medicare Other

## 2022-04-24 DIAGNOSIS — I442 Atrioventricular block, complete: Secondary | ICD-10-CM

## 2022-04-24 LAB — CUP PACEART REMOTE DEVICE CHECK
Battery Remaining Longevity: 78 mo
Battery Remaining Percentage: 81 %
Battery Voltage: 2.98 V
Brady Statistic AP VP Percent: 25 %
Brady Statistic AP VS Percent: 1 %
Brady Statistic AS VP Percent: 74 %
Brady Statistic AS VS Percent: 1 %
Brady Statistic RA Percent Paced: 24 %
Date Time Interrogation Session: 20231220010635
HighPow Impedance: 75 Ohm
Implantable Lead Connection Status: 753985
Implantable Lead Connection Status: 753985
Implantable Lead Connection Status: 753985
Implantable Lead Implant Date: 20140313
Implantable Lead Implant Date: 20140313
Implantable Lead Implant Date: 20140313
Implantable Lead Location: 753858
Implantable Lead Location: 753859
Implantable Lead Location: 753860
Implantable Pulse Generator Implant Date: 20220622
Lead Channel Impedance Value: 390 Ohm
Lead Channel Impedance Value: 450 Ohm
Lead Channel Impedance Value: 790 Ohm
Lead Channel Pacing Threshold Amplitude: 0.5 V
Lead Channel Pacing Threshold Amplitude: 0.625 V
Lead Channel Pacing Threshold Amplitude: 1.375 V
Lead Channel Pacing Threshold Pulse Width: 0.5 ms
Lead Channel Pacing Threshold Pulse Width: 0.5 ms
Lead Channel Pacing Threshold Pulse Width: 0.6 ms
Lead Channel Sensing Intrinsic Amplitude: 11.8 mV
Lead Channel Sensing Intrinsic Amplitude: 3.6 mV
Lead Channel Setting Pacing Amplitude: 1.625
Lead Channel Setting Pacing Amplitude: 1.875
Lead Channel Setting Pacing Amplitude: 2 V
Lead Channel Setting Pacing Pulse Width: 0.5 ms
Lead Channel Setting Pacing Pulse Width: 0.6 ms
Lead Channel Setting Sensing Sensitivity: 0.5 mV
Pulse Gen Serial Number: 810030647

## 2022-05-06 DIAGNOSIS — I209 Angina pectoris, unspecified: Secondary | ICD-10-CM

## 2022-05-06 HISTORY — DX: Angina pectoris, unspecified: I20.9

## 2022-05-15 ENCOUNTER — Ambulatory Visit (INDEPENDENT_AMBULATORY_CARE_PROVIDER_SITE_OTHER): Payer: Medicare Other

## 2022-05-15 ENCOUNTER — Ambulatory Visit (INDEPENDENT_AMBULATORY_CARE_PROVIDER_SITE_OTHER): Payer: Medicare Other | Admitting: Podiatry

## 2022-05-15 VITALS — BP 169/81 | HR 71

## 2022-05-15 DIAGNOSIS — L97502 Non-pressure chronic ulcer of other part of unspecified foot with fat layer exposed: Secondary | ICD-10-CM

## 2022-05-15 DIAGNOSIS — E0843 Diabetes mellitus due to underlying condition with diabetic autonomic (poly)neuropathy: Secondary | ICD-10-CM

## 2022-05-15 NOTE — Progress Notes (Signed)
Chief Complaint  Patient presents with   Foot Ulcer    Right foot ulcer, patient denies any pain, patient has no drainage    Subjective:  77 y.o. male with PMHx of diabetes mellitus presenting today for recurrence of the ulcer to the plantar aspect of the first MTP of the right foot.  Patient states that he has been dealing with a callus/ulcer to the area since 2018.  He says that most recently there was a callus that developed and it has since become an ulcer.   Past Medical History:  Diagnosis Date   AAA (abdominal aortic aneurysm) ([place]) 2011   Per vascular surgery   Anxiety    no med. in use for anxiety but pt. speaks openly of his stress & anxious feelings regarding impending surgery     Atrial fibrillation ([place])    Automatic implantable cardioverter-defibrillator in situ    CAD (coronary artery disease)    Presumed CAD with nuclear scan October 09, 2011,  large anteroseptal MI and inferior MI. Catheterization scheduled October 15, 2011   Cardiomyopathy [facility])    Nuclear, October 09, 2011, EF 30%, multiple focal wall motion abnormalities   Diabetes mellitus    type II   Drug therapy    Hyperkalemia with spironolactone   Ejection fraction < 50%    EF 30%, nuclear, October 09, 2011   Ejection fraction < 50%    EF previously 30%  //   EF 30-35%, echo, July 14, 2012, severe diffuse hypokinesis, PA pressure 43 mm mercury   Fall due to ice or snow Feb.  19, 2015   HLD (hyperlipidemia)    Hypertension    white coat HTN-- often elevated in office and controlled on outside checks.   ICD (implantable cardioverter-defibrillator) in place    CRT-D placed March, 2014 complete heart block and k dysfunction   LBBB (left bundle branch block)    LBBB on EKG October 11, 2011,  no prior EKG has been done   Low testosterone    Hx of   Myocardial infarction ([place])    Told in 2015-never knew-showed up on stress test for AAA;    Pacemaker    PAD (peripheral artery disease) ([place])    Pancreatitis     Past  Surgical History:  Procedure Laterality Date   ABDOMINAL AORTAGRAM N/A 10/28/2011   Procedure: ABDOMINAL [name];  Surgeon: Angelia Mould, MD;  Location: [facility] CATH LAB;  Service: Cardiovascular;  Laterality: N/A;   ABDOMINAL AORTIC ANEURYSM REPAIR     EVAR    BI-VENTRICULAR IMPLANTABLE CARDIOVERTER DEFIBRILLATOR N/A 07/16/2012   Procedure: BI-VENTRICULAR IMPLANTABLE CARDIOVERTER DEFIBRILLATOR  (CRT-D);  Surgeon: Deboraha Sprang, MD;  Location: [facility] CATH LAB;  Service: Cardiovascular;  Laterality: N/A;   BIV ICD GENERATOR CHANGEOUT N/A 10/25/2020   Procedure: BIV ICD GENERATOR CHANGEOUT;  Surgeon: Deboraha Sprang, MD;  Location: [place] CV LAB;  Service: Cardiovascular;  Laterality: N/A;   CARDIAC CATHETERIZATION     COLONOSCOPY  03/23/2018; 2020   ESOPHAGOGASTRODUODENOSCOPY N/A 02/12/2019   Procedure: ESOPHAGOGASTRODUODENOSCOPY (EGD);  Surgeon: Jackquline Denmark, MD;  Location: [facility] ENDOSCOPY;  Service: Endoscopy;  Laterality: N/A;   ESOPHAGOGASTRODUODENOSCOPY (EGD) WITH PROPOFOL N/A 02/05/2018   Procedure: ESOPHAGOGASTRODUODENOSCOPY (EGD) WITH PROPOFOL;  Surgeon: Mauri Pole, MD;  Location: [facility] ENDOSCOPY;  Service: Endoscopy;  Laterality: N/A;   ESOPHAGOGASTRODUODENOSCOPY (EGD) WITH PROPOFOL N/A 09/27/2018   Procedure: ESOPHAGOGASTRODUODENOSCOPY (EGD) WITH PROPOFOL;  Surgeon: Carol Ada, MD;  Location: [place];  Service: Endoscopy;  Laterality: N/A;   FOREIGN BODY REMOVAL  09/27/2018   Procedure: FOREIGN BODY REMOVAL;  Surgeon: Carol Ada, MD;  Location: [facility] ENDOSCOPY;  Service: Endoscopy;;   FOREIGN BODY REMOVAL  02/12/2019   Procedure: FOREIGN BODY REMOVAL;  Surgeon: Jackquline Denmark, MD;  Location: [facility] ENDOSCOPY;  Service: Endoscopy;;   HERNIA REPAIR  Jan. 9, 2015   INSERTION OF MESH N/A 05/14/2013   Procedure: INSERTION OF MESH;  Surgeon: Adin Hector, MD;  Location: [place];  Service: General;  Laterality: N/A;   LEFT HEART CATH AND CORONARY ANGIOGRAPHY N/A 07/17/2021    Procedure: LEFT HEART CATH AND CORONARY ANGIOGRAPHY;  Surgeon: Troy Sine, MD;  Location: [place] CV LAB;  Service: Cardiovascular;  Laterality: N/A;   LOWER EXTREMITY ANGIOGRAM Bilateral 10/28/2011   Procedure: LOWER EXTREMITY ANGIOGRAM;  Surgeon: Angelia Mould, MD;  Location: [facility] CATH LAB;  Service: Cardiovascular;  Laterality: Bilateral;   PACEMAKER INSERTION  07-16-12   pacemaker/defibrilator   POLYPECTOMY     POSTERIOR CERVICAL FUSION/FORAMINOTOMY N/A 06/09/2014   Procedure: Laminectomy - Cervical two-Cervcial four posterior cervical instrumented fusion Cervical two-cervical four;  Surgeon: Eustace Moore, MD;  Location: [place] NEURO ORS;  Service: Neurosurgery;  Laterality: N/A;  posterior    SAVORY DILATION N/A 02/05/2018   Procedure: SAVORY DILATION;  Surgeon: Mauri Pole, MD;  Location: [facility] ENDOSCOPY;  Service: Endoscopy;  Laterality: N/A;   TONSILLECTOMY     as a child    UMBILICAL HERNIA REPAIR N/A 05/14/2013   Procedure: LAPAROSCOPIC exploration and repair of hernia in abdominal ;  Surgeon: Adin Hector, MD;  Location: [place];  Service: General;  Laterality: N/A;   UPPER GASTROINTESTINAL ENDOSCOPY  2020    Allergies  Allergen Reactions   Aldactone [Spironolactone] Other (See Comments)    Hyperkalemia   Codeine Rash and Hives   Atorvastatin     Myalgias with lipitor.  Does tolerate simvastatin.     Januvia [Sitagliptin] Other (See Comments)    Diarrhea and heart racing   Jardiance [Empagliflozin] Other (See Comments)    Polyuria; excessive weight loss   Lisinopril     Possible cause of pancreatitis     Objective/Physical Exam General: The patient is alert and oriented x3 in no acute distress.  Dermatology:  Mostly unchanged since last visit.  Wound #1 noted to the plantar aspect of the first MTP right foot measuring approximately 1.0 x 1.0 x 0.3 cm (LxWxD).  The ulcer directly underlies the sesamoidal apparatus of the right foot  To the noted  ulceration(s), there is no eschar. There is a moderate amount of slough, fibrin, and necrotic tissue noted. Granulation tissue and wound base is red. There is a minimal amount of serosanguineous drainage noted. There is no exposed bone muscle-tendon ligament or joint. There is no malodor. Periwound integrity is intact. Skin is warm, dry and supple bilateral lower extremities.  Vascular: Palpable pedal pulses bilaterally. No edema or erythema noted. Capillary refill within normal limits.  Clinically there is no indication of vascular compromise  Neurological: Light touch and protective threshold diminished bilaterally.   Musculoskeletal Exam: No pedal deformity.  No prior amputations.  Muscle strength 5/5 all compartments  Radiographic exam RT foot: Skin marker placed directly underlying the ulcer which correlates with the fibular sesamoid.  No acute fractures identified.  Radiographic evidence for osteomyelitis. Assessment: 1.  Ulcer plantar aspect of the first MTP right secondary to diabetes mellitus 2. diabetes mellitus w/ peripheral neuropathy 3.  Foot  sprain left; resolved   Plan of Care:  1. Patient was evaluated. 2. medically necessary excisional debridement including subcutaneous tissue was performed using a tissue nipper and a chisel blade. Excisional debridement of all the necrotic nonviable tissue down to healthy bleeding viable tissue was performed with post-debridement measurements same as pre-. 3. the wound was cleansed and dry sterile dressing applied. 4.  Continue applying bacitracin to the area.  Continue with a light dressing daily 5.  Continue offloading felt dancers pads that were applied to the insoles in the shoes to offload pressure from the foot and wound 6.  Today again we discussed in detail fibular sesamoidectomy to help offload pressure from the area and allow for healing.  The patient is ready to pursue this option.  I do believe is appropriate since he has failed  multiple conservative treatment options including routine foot care, serial debridements, offloading accommodative insoles 7.  The procedure was explained in detail.  Risk benefits advantages and disadvantages were explained.  No guarantees were expressed or implied.  Authorization for surgery was initiated today.  Surgery will consist of fibular sesamoidectomy right .  Return to clinic 1 week postop   Edrick Kins, DPM [facility]  Dr. Edrick Kins, DPM    [address], [place] [address]                Office [phone]  Fax [phone]

## 2022-05-17 ENCOUNTER — Other Ambulatory Visit: Payer: Self-pay | Admitting: *Deleted

## 2022-05-17 ENCOUNTER — Telehealth: Payer: Self-pay

## 2022-05-17 DIAGNOSIS — I739 Peripheral vascular disease, unspecified: Secondary | ICD-10-CM

## 2022-05-17 DIAGNOSIS — Z9889 Other specified postprocedural states: Secondary | ICD-10-CM

## 2022-05-17 NOTE — Progress Notes (Signed)
Remote ICD transmission.   

## 2022-05-17 NOTE — Progress Notes (Signed)
Care Management & Coordination Services Pharmacy Team    Contacted patient on 05/17/2022   Hospital visits:  None in previous 6 months  Reason for Encounter: Appointment ReminderMedications: Outpatient Encounter Medications as of 05/17/2022  Medication Sig   albuterol (VENTOLIN HFA) 108 (90 Base) MCG/ACT inhaler Inhale 2 puffs into the lungs every 6 (six) hours as needed for wheezing or shortness of breath. (Patient not taking: Reported on 04/22/2022)   amLODipine (NORVASC) 10 MG tablet TAKE 1 TABLET(10 MG) BY MOUTH DAILY   aspirin EC 81 MG tablet Take 81 mg by mouth every evening.   carvedilol (COREG) 25 MG tablet TAKE 1 TABLET BY MOUTH TWICE DAILY WITH MEALS   Cholecalciferol (VITAMIN D3) 10 MCG (400 UNIT) CAPS Take 1 capsule by mouth daily.   ESBRIET 267 MG TABS Take 2 tablets (534 mg total) by mouth in the morning, at noon, and at bedtime. **NOTE LOW DOSE AS MAINTENANCE**   glipiZIDE (GLUCOTROL) 5 MG tablet TAKE 1 TABLET(5 MG) BY MOUTH TWICE DAILY BEFORE A MEAL   glucose blood (ACCU-CHEK AVIVA PLUS) test strip AS DIRECTED TO CHECK BLOOD SUGAR TWICE DAILY   insulin glargine (LANTUS SOLOSTAR) 100 UNIT/ML Solostar Pen INJECT 0.3 TO 0.35 MLS(30 TO 35 UNITS) INTO THE SKIN EVERY DAY   isosorbide mononitrate (IMDUR) 30 MG 24 hr tablet Take 1 tablet (30 mg total) by mouth daily.   metFORMIN (GLUCOPHAGE) 500 MG tablet TAKE 2 TABLETS BY MOUTH TWICE DAILY WITH FOOD   Multiple Vitamin (MULTIVITAMIN WITH MINERALS) TABS tablet Take 1 tablet by mouth every evening.   omeprazole (PRILOSEC) 40 MG capsule TAKE 1 CAPSULE(40 MG) BY MOUTH TWICE DAILY   simvastatin (ZOCOR) 40 MG tablet TAKE 1 TABLET BY MOUTH AT BEDTIME   umeclidinium bromide (INCRUSE ELLIPTA) 62.5 MCG/ACT AEPB Inhale 1 puff into the lungs daily.   valsartan (DIOVAN) 40 MG tablet Take 0.5-1 tablets (20-40 mg total) by mouth daily.   vitamin B-12 (CYANOCOBALAMIN) 1000 MCG tablet Take 1,000 mcg by mouth in the morning.   No  facility-administered encounter medications on file as of 05/17/2022.   Lab Results  Component Value Date/Time   HGBA1C 7.9 (H) 04/16/2022 10:51 AM   HGBA1C 7.3 (A) 10/15/2021 09:57 AM   HGBA1C 7.8 (H) 04/13/2021 04:28 PM   MICROALBUR 2.0 (H) 04/16/2022 10:51 AM   MICROALBUR 1.7 10/07/2008 12:00 AM    BP Readings from Last 3 Encounters:  05/15/22 (!) 169/81  04/16/22 (!) 140/70  04/04/22 (!) 146/68    Unsuccessful attempt to reach patient. Left patient message reminding patient of appointment.  Patient contacted to confirm telephone appointment with Charlene Brooke,   PharmD, on 05/22/22 at 11:00.  What is your top health concern you would like to discuss at your upcoming visit?   Have you seen any other providers since your last visit with PCP? Yes-podiatry, pulmonology    Star Rating Drugs:  Medication:  Last Fill: Day Supply Glipizide '5mg'$   03/08/22  90 Lantus    manufacturer Metformin '500mg'$  03/01/22 90 Simvastatin '40mg'$  03/20/22 90 Valsartan '40mg'$  03/04/22 90   Care Gaps: Annual wellness visit in last year? Yes  If Diabetic: Last eye exam / retinopathy screening:2022 Last diabetic foot exam:UTD   Charlene Brooke, pharmD notified  Luke [name], [place]  [phone]

## 2022-05-22 ENCOUNTER — Telehealth: Payer: Self-pay | Admitting: Podiatry

## 2022-05-22 ENCOUNTER — Other Ambulatory Visit: Payer: Self-pay | Admitting: Podiatry

## 2022-05-22 ENCOUNTER — Ambulatory Visit: Payer: Medicare Other | Admitting: Pharmacist

## 2022-05-22 DIAGNOSIS — E0843 Diabetes mellitus due to underlying condition with diabetic autonomic (poly)neuropathy: Secondary | ICD-10-CM

## 2022-05-22 DIAGNOSIS — L97502 Non-pressure chronic ulcer of other part of unspecified foot with fat layer exposed: Secondary | ICD-10-CM

## 2022-05-22 NOTE — Telephone Encounter (Signed)
DOS: 06/21/2022  Medicare Part A & B BCBS Medicare Effective:  Sesamoidectomy Rt (12162)  Deductible: $ Out-of-Pocket: $ CoInsurance:  Prior authorization is

## 2022-05-22 NOTE — Progress Notes (Signed)
Care Management & Coordination Services Pharmacy Note  05/22/2022 Name:  Luke [name] MRN:  [id number] DOB:  Aug 26, 1945  Summary: -Reviewed GDMT for HF and CKD; discussed valsartan benefits outweigh risks -Home BP 120-130s/60s; pt reports improvement in dizziness since reducing valsartan and isosorbide -DM: A1c 7.9% (04/2022), slightly worsened from previous; pt reports BG range 75-180, checking twice daily; he declined CGM today.    Recommendations/Changes made from today's visit: -No med changes  Follow up plan: -Health Concierge will call eye doctor for eye exam records -Pharmacist follow up televisit scheduled for 6 months -PCP 10/17/22    Subjective: Luke [name] is an 77 y.o. year old male who is a primary patient of Tonia Ghent, MD.  The care coordination team was consulted for assistance with disease management and care coordination needs.    Engaged with patient by telephone for follow up visit.  Recent office visits: 04/16/22 Dr Damita Dunnings OV: annual - reduced isosorbide to 30 mg. Changed valsartan to 20-40 mg daily.  Recent consult visits: 04/04/22 Dr Chase Caller (Pulmonary): f/u IPF  03/19/22 Dr Marlou Porch (Cardiology): f/u heart block, HF, CAD. No changes.  Hospital visits: None in previous 6 months   Objective:  Lab Results  Component Value Date   CREATININE 1.21 04/04/2022   BUN 26 (H) 04/04/2022   GFR 58.58 (L) 04/04/2022   EGFR 71 07/11/2021   GFRNONAA 58 (L) 08/22/2021   GFRAA >60 02/12/2019   NA 134 (L) 04/04/2022   K 4.2 04/04/2022   CALCIUM 9.7 04/04/2022   CO2 28 04/04/2022   GLUCOSE 296 (H) 04/04/2022    Lab Results  Component Value Date/Time   HGBA1C 7.9 (H) 04/16/2022 10:51 AM   HGBA1C 7.3 (A) 10/15/2021 09:57 AM   HGBA1C 7.8 (H) 04/13/2021 04:28 PM   GFR 58.58 (L) 04/04/2022 02:49 PM   GFR 57.53 (L) 01/08/2022 02:56 PM   MICROALBUR 2.0 (H) 04/16/2022 10:51 AM   MICROALBUR 1.7 10/07/2008 12:00 AM    Last diabetic Eye exam:   Lab Results  Component Value Date/Time   HMDIABEYEEXA Retinopathy (A) 12/11/2020 12:00 AM    Last diabetic Foot exam:  Lab Results  Component Value Date/Time   HMDIABFOOTEX yes 07/05/2010 12:00 AM     Lab Results  Component Value Date   CHOL 153 04/16/2022   HDL 34.40 (L) 04/16/2022   LDLCALC 74 04/13/2021   LDLDIRECT 75.0 04/16/2022   TRIG 346.0 (H) 04/16/2022   CHOLHDL 4 04/16/2022       Latest Ref Rng & Units 03/14/2022   11:28 AM 01/08/2022    2:56 PM 10/03/2021    9:18 AM  Hepatic Function  Total Protein 6.0 - 8.3 g/dL 7.2  7.0    7.0  6.9   Albumin 3.5 - 5.2 g/dL 4.2  4.0    4.0  4.1   AST 0 - 37 U/L '15  15    15  15   '$ ALT 0 - 53 U/L '14  13    13  11   '$ Alk Phosphatase 39 - 117 U/L 64  67    67  55   Total Bilirubin 0.2 - 1.2 mg/dL 0.6  0.5    0.5  0.8   Bilirubin, Direct 0.0 - 0.3 mg/dL 0.2  0.1  0.1     Lab Results  Component Value Date/Time   TSH 2.75 02/12/2018 01:01 PM   TSH 2.868 07/14/2012 04:33 PM       Latest Ref Rng &  Units 04/04/2022    2:49 PM 10/03/2021    9:18 AM 08/22/2021   10:05 AM  CBC  WBC 4.0 - 10.5 K/uL 10.5  9.9  11.3   Hemoglobin 13.0 - 17.0 g/dL 14.3  13.3  13.7   Hematocrit 39.0 - 52.0 % 41.3  39.3  39.8   Platelets 150.0 - 400.0 K/uL 293.0  250.0  201     Lab Results  Component Value Date/Time   VITAMINB12 644 02/16/2020 08:06 AM   VITAMINB12 >1500 (H) 02/18/2019 09:58 AM    Clinical ASCVD: Yes  The 10-year ASCVD risk score (Arnett DK, et al., 2019) is: 66.8%   Values used to calculate the score:     Age: 3 years     Sex: Male     Is Non-Hispanic African American: No     Diabetic: Yes     Tobacco smoker: No     Systolic Blood Pressure: 379 mmHg     Is BP treated: Yes     HDL Cholesterol: 34.4 mg/dL     Total Cholesterol: 153 mg/dL       04/22/2022    2:04 PM 04/16/2022    9:58 AM 04/13/2021    3:51 PM  Depression screen PHQ 2/9  Decreased Interest 0 0 0  Down, Depressed, Hopeless 0 0 0  PHQ - 2 Score 0 0  0  Altered sleeping 0    Tired, decreased energy 0    Change in appetite 0    Feeling bad or failure about yourself  0    Trouble concentrating 0    Moving slowly or fidgety/restless 0    Suicidal thoughts 0    PHQ-9 Score 0    Difficult doing work/chores Not difficult at all       Social History   Tobacco Use  Smoking Status Former   Packs/day: 2.00   Years: 40.00   Total pack years: 80.00   Types: Cigarettes   Start date: 1964   Quit date: 05/06/2004   Years since quitting: 18.0  Smokeless Tobacco Never   BP Readings from Last 3 Encounters:  05/15/22 (!) 169/81  04/16/22 (!) 140/70  04/04/22 (!) 146/68   Pulse Readings from Last 3 Encounters:  05/15/22 71  04/16/22 71  04/04/22 75   Wt Readings from Last 3 Encounters:  04/22/22 187 lb (84.8 kg)  04/16/22 187 lb (84.8 kg)  04/04/22 186 lb 9.6 oz (84.6 kg)   BMI Readings from Last 3 Encounters:  04/22/22 25.36 kg/m  04/16/22 25.36 kg/m  04/04/22 25.31 kg/m    Allergies  Allergen Reactions   Aldactone [Spironolactone] Other (See Comments)    Hyperkalemia   Codeine Rash and Hives   Atorvastatin     Myalgias with lipitor.  Does tolerate simvastatin.     Januvia [Sitagliptin] Other (See Comments)    Diarrhea and heart racing   Jardiance [Empagliflozin] Other (See Comments)    Polyuria; excessive weight loss   Lisinopril     Possible cause of pancreatitis    Medications Reviewed Today     Reviewed by Charlton Haws, [facility] (Pharmacist) on 05/22/22 at 1141  Med List Status: <None>   Medication Order Taking? Sig Documenting Provider Last Dose Status Informant  albuterol (VENTOLIN HFA) 108 (90 Base) MCG/ACT inhaler [id number] No Inhale 2 puffs into the lungs every 6 (six) hours as needed for wheezing or shortness of breath.  Patient not taking: Reported on 05/22/2022   Brand Males, MD  Not Taking Active Self, Pharmacy Records           Med Note Lynn Ito   Tue Sep 11, 2021 10:56 AM)     amLODipine (NORVASC) 10 MG tablet [id number] Yes TAKE 1 TABLET(10 MG) BY MOUTH DAILY Jerline Pain, MD Taking Active   aspirin EC 81 MG tablet [id number] Yes Take 81 mg by mouth every evening. [provider] Taking Active Self, Pharmacy Records  carvedilol (COREG) 25 MG tablet [id number] Yes TAKE 1 TABLET BY MOUTH TWICE DAILY WITH MEALS Skains, Thana Farr, MD Taking Active   Cholecalciferol (VITAMIN D3) 10 MCG (400 UNIT) CAPS [id number] Yes Take 1 capsule by mouth daily. [provider] Taking Active Self, Pharmacy Records  ESBRIET 267 MG TABS [id number] Yes Take 2 tablets (534 mg total) by mouth in the morning, at noon, and at bedtime. **NOTE LOW DOSE AS MAINTENANCEChase Caller, Belva Crome, MD Taking Active   glipiZIDE (GLUCOTROL) 5 MG tablet [id number] Yes TAKE 1 TABLET(5 MG) BY MOUTH TWICE DAILY BEFORE A MEAL Tonia Ghent, MD Taking Active   glucose blood (ACCU-CHEK AVIVA PLUS) test strip [id number] Yes AS DIRECTED TO CHECK BLOOD SUGAR TWICE DAILY Tonia Ghent, MD Taking Active   insulin glargine (LANTUS SOLOSTAR) 100 UNIT/ML Solostar Pen [id number] Yes INJECT 0.3 TO 0.35 MLS(30 TO 35 UNITS) INTO THE SKIN EVERY DAY Tonia Ghent, MD Taking Active Self, Pharmacy Records           Med Note Malena Catholic May 22, 2022 11:41 AM) [employer] PAP  isosorbide mononitrate (IMDUR) 30 MG 24 hr tablet [id number] Yes Take 1 tablet (30 mg total) by mouth daily. Tonia Ghent, MD Taking Active   metFORMIN (GLUCOPHAGE) 500 MG tablet [id number] Yes TAKE 2 TABLETS BY MOUTH TWICE DAILY WITH FOOD Tonia Ghent, MD Taking Active   Multiple Vitamin (MULTIVITAMIN WITH MINERALS) TABS tablet [id number] Yes Take 1 tablet by mouth every evening. [provider] Taking Active Self, Pharmacy Records  omeprazole (PRILOSEC) 40 MG capsule [id number] Yes TAKE 1 CAPSULE(40 MG) BY MOUTH TWICE DAILY Nandigam, Venia Minks, MD Taking Active Self, Pharmacy Records  simvastatin (ZOCOR) 40 MG tablet  [id number] Yes TAKE 1 TABLET BY MOUTH AT BEDTIME Tonia Ghent, MD Taking Active   umeclidinium bromide (INCRUSE ELLIPTA) 62.5 MCG/ACT AEPB [id number] Yes Inhale 1 puff into the lungs daily. Brand Males, MD Taking Active   valsartan (DIOVAN) 40 MG tablet [id number] Yes Take 0.5-1 tablets (20-40 mg total) by mouth daily. Tonia Ghent, MD Taking Active   vitamin B-12 (CYANOCOBALAMIN) 1000 MCG tablet [id number] Yes Take 1,000 mcg by mouth in the morning. [provider] Taking Active Self, Pharmacy Records           Med Note Hassell Done, [place]   Tue Sep 11, 2021 10:57 AM)              SDOH:  (Social Determinants of Health) assessments and interventions performed: No SDOH Interventions    Flowsheet Row Telephone from 04/22/2022 in [place] Most recent reading at 04/22/2022  5:00 PM Clinical Support from 04/22/2022 in [place] at [place] Most recent reading at 04/22/2022  2:05 PM Chronic Care Management from 10/19/2021 in [place] at [place] Most recent reading at 10/19/2021  1:35 PM Chronic Care Management from 12/18/2020 in [place] at [place] Most recent reading at 12/18/2020  8:54 AM Chronic Care Management from 06/13/2020 in [place] at  [place] Most recent reading at 06/23/2020 10:44 AM  SDOH Interventions       Food Insecurity Interventions Intervention Not Indicated Intervention Not Indicated Intervention Not Indicated -- --  Housing Interventions Intervention Not Indicated Intervention Not Indicated -- -- --  Transportation Interventions Intervention Not Indicated Intervention Not Indicated Intervention Not Indicated -- --  Utilities Interventions -- Intervention Not Indicated -- -- --  Alcohol Usage Interventions -- Intervention Not Indicated (Score <7) -- -- --  Financial Strain Interventions -- Intervention Not Indicated -- Intervention Not Indicated --  [Apply for Lantus  assistance]  Physical Activity Interventions -- Intervention Not Indicated -- -- --  Stress Interventions -- Intervention Not Indicated -- -- --  Social Connections Interventions -- Intervention Not Indicated -- -- --      SDOH Screenings   Food Insecurity: No Food Insecurity (04/22/2022)  Housing: Low Risk  (04/22/2022)  Transportation Needs: No Transportation Needs (04/22/2022)  Utilities: Not At Risk (04/22/2022)  Alcohol Screen: Low Risk  (04/22/2022)  Depression (PHQ2-9): Low Risk  (04/22/2022)  Financial Resource Strain: Low Risk  (04/22/2022)  Physical Activity: Sufficiently Active (04/22/2022)  Social Connections: Moderately Isolated (04/22/2022)  Stress: No Stress Concern Present (04/22/2022)  Tobacco Use: Medium Risk (04/22/2022)    Medication Assistance:  Lantus - [employer] PAP approved through 05/06/23 Incruse - [employer] PAP approved through 05/05/22  Medication Access: Within the past 30 days, how often has patient missed a dose of medication? 0 Is a pillbox or other method used to improve adherence? Yes  Factors that may affect medication adherence? no barriers identified Are meds synced by current pharmacy? No  Are meds delivered by current pharmacy? No  Does patient experience delays in picking up medications due to transportation concerns? No   [facility] Services Reviewed: Is patient disadvantaged to use [facility]?: Yes  Current Rx insurance plan: [employer] Name and location of Current pharmacy:  [facility] [place], [place] - [address] [place] [address] Phone: [phone] Fax: [phone]  [facility] Pharm[facility] services reviewed with patient today?: No  Patient requests to transfer care to [facility] Pharm[facility]?: No  Reason patient declined to change pharmacies: Disadvantaged due to insurance/mail order  Compliance/Adherence/Medication fill history: Care Gaps: Eye exam (due 12/11/21) -  Dr Idolina Primer. Pt reports he had one recently. Will request records  Star-Rating Drugs: Simvastatin - PDC 97% Valsartan - PDC 100% Metformin - PDC 100% Glipizide - PDC 100%   Assessment/Plan  Hypertension / Heart Failure(BP goal <140/90) -Controlled - per home readings -Current home readings: 3x a week - 120-130s/60s -Reports long history of home readings much lower than in clinic - White coat syndrome -Last ejection fraction: 40-45% (Date: 07/2021) - worse than 07/2017 60-65% -HF type: HFmrEF (mildly reduced EF 41-49%) -Current treatment: Amlodipine 10 mg daily - Appropriate, Effective, Safe, Accessible Carvedilol '25mg'$  BID - Appropriate, Effective, Safe, Accessible Hydralazine 25 mg TID - Appropriate, Effective, Safe, Query Accessible Isosorbide MN 30 mg daily - Appropriate, Effective, Safe, Accessible Valsartan 40 mg 1/2-1 tab daily - Appropriate, Effective, Safe, Accessible -Medications previously tried: lisinopril (pancreatitis), aldactone (hyperkalemia); denies history of thiazide -Continue monitoring BP at home weekly -Reviewed valsartan benefit in HF, CKD, HTN - pt had concerns about side effects but discussed benefits far outweigh risks; also discussed isosorbide/hydralazine combination as GDMT for HF, not just angina treatment -Recommended to continue current medication  Hyperlipidemia/CAD: (LDL goal < 70) -Controlled - LDL 75 (  04/2022);  Pt affirms daily adherence to statin and aspirin 81 mg. -Current treatment: Simvastatin 40 mg daily - Appropriate, Effective, Safe, Accessible Aspirin 81 mg daily -Appropriate, Effective, Safe, Accessible -Medications previously tried: atorvastatin (leg cramps) -Reviewed cholesterol goals. -Recommended to continue current medication  Diabetes (A1c goal <7.5%) -Not ideally controlled - A1c 7.9% (04/2022), slightly worsened from previous but pt had broken his ankle a few weeks prior to last A1c and was immobile while recovering (normally he  exercises daily) -History: Reports started insulin 2019 due to A1c consistently above 8% and could not get it down  -Current home glucose readings - 73 today; 130-140 at night -Reports hypoglycemic symptoms rare, occurs when BG is in 60s Denies hyperglycemic symptoms. -Current medications: Lantus 30 units daily (PAP) - Appropriate, Effective, Safe, Accessible Metformin '500mg'$  - 2 tab BID -Appropriate, Effective, Safe, Accessible Glipizide 5 mg BID - Appropriate, Effective, Safe, Accessible -Medications previously tried: Jardiance (urinary frequency); Not a candidate for GLP-1 due to history of pancreatitis with lisinopril -Discussed CGM, pt is not interested, he thinks he would be too "neurotic" -Recommended to continue current medication   IPF (Goal: manage symptoms) -Controlled - however Incruse is cost-prohibitive due to high deductible -Follows with pulmonary (Dr Chase Caller) -Current treatment  Incruse Ellipta 1 puff daily - Appropriate, Effective, Safe, Query Accessible Albuterol HFA - Appropriate, Effective, Safe, Accessible -Medications previously tried: Esbriet, Spiriva (non-preferred) -reviewed Incruse PAP requirement -$600 OOP rx required before enrollment, discussed he has to meet this before he can apply again in 2024 -Recommended to continue current medication; advised pt to contact us when he spends $600 on Rx drugs so we can re-apply for [place] PAP   Charlene Brooke, PharmD, BCACP Clinical Pharmacist [facility] at [facility] [phone]

## 2022-05-22 NOTE — Telephone Encounter (Signed)
DOS: 06/21/2022  Sesamoidectomy Rt (07615)  Deductible: $240 with $0 met  Prior authorization is not required for patients insurance.

## 2022-05-22 NOTE — Patient Instructions (Signed)
Visit Information  Phone number for Pharmacist: [phone]  Thank you for meeting with me to discuss your medications! Below is a summary of what we talked about during the visit:   Summary: -Reviewed GDMT for HF and CKD; discussed valsartan benefits outweigh risks -Home BP 120-130s/60s; pt reports improvement in dizziness since reducing valsartan and isosorbide -DM: A1c 7.9% (04/2022), slightly worsened from previous; pt reports BG range 75-180, checking twice daily; he declined CGM today.    Recommendations/Changes made from today's visit: -No med changes  Follow up plan: -Health Concierge will call eye doctor for eye exam records -Pharmacist follow up televisit scheduled for 6 months -PCP 10/17/22   Charlene Brooke, PharmD, BCACP Clinical Pharmacist [facility] at [facility] [phone]

## 2022-05-23 ENCOUNTER — Other Ambulatory Visit: Payer: Self-pay

## 2022-05-23 ENCOUNTER — Ambulatory Visit (INDEPENDENT_AMBULATORY_CARE_PROVIDER_SITE_OTHER): Payer: Medicare Other | Admitting: Physician Assistant

## 2022-05-23 ENCOUNTER — Ambulatory Visit (INDEPENDENT_AMBULATORY_CARE_PROVIDER_SITE_OTHER)
Admission: RE | Admit: 2022-05-23 | Discharge: 2022-05-23 | Disposition: A | Discharge status: Home / Self Care | Payer: Medicare Other | Source: Ambulatory Visit | Attending: Vascular Surgery | Admitting: Vascular Surgery

## 2022-05-23 ENCOUNTER — Ambulatory Visit (HOSPITAL_COMMUNITY)
Admission: RE | Admit: 2022-05-23 | Discharge: 2022-05-23 | Disposition: A | Discharge status: Home / Self Care | Payer: Medicare Other | Source: Ambulatory Visit | Attending: Vascular Surgery | Admitting: Vascular Surgery

## 2022-05-23 VITALS — BP 141/71 | HR 68 | Temp 98.0°F | Resp 20 | Ht 72.0 in | Wt 183.4 lb

## 2022-05-23 DIAGNOSIS — I714 Abdominal aortic aneurysm, without rupture, unspecified: Secondary | ICD-10-CM

## 2022-05-23 DIAGNOSIS — Z9889 Other specified postprocedural states: Secondary | ICD-10-CM | POA: Diagnosis not present

## 2022-05-23 DIAGNOSIS — L97519 Non-pressure chronic ulcer of other part of right foot with unspecified severity: Secondary | ICD-10-CM | POA: Diagnosis not present

## 2022-05-23 DIAGNOSIS — I739 Peripheral vascular disease, unspecified: Secondary | ICD-10-CM

## 2022-05-23 LAB — VAS US ABI WITH/WO TBI
Left ABI: 0.91
Right ABI: 0.63

## 2022-05-23 NOTE — H&P (View-Only) (Signed)
Office Note   History of Present Illness   Luke [name] is a 77 y.o. (1946-02-22) male who presents for surveillance of AAA s/p EVAR and PAD.  He underwent endovascular repair of AAA by Dr. Scot Dock in 2013.  He has a history of asymptomatic PAD with no previous interventions.  At follow-up today he is doing well and denies any abdominal or back pain.  He also denies any claudication or rest pain in bilateral lower extremities.  He does have a recurrent ulcer on the plantar aspect of the first MTP of the right foot.  He has dealt with a recurrent callus/ulcer to this area since 2018.  This ulcer is being followed by Dr. Amalia Hailey.  It has been previously managed with conservative treatment, including serial excisional debridements and offloading accommodative insoles.  At his most recent visit with podiatry on 1/10, it was determined he would benefit from right fibular sesamoidectomy to allow for offloading and help his ulcer heal.  This surgery is currently planned for 06/21/2022 with Dr. Amalia Hailey.  He does not have any wounds on the left lower extremity.  He does have a history of diabetes with neuropathy and CKD stage III.  Current Outpatient Medications  Medication Sig Dispense Refill   albuterol (VENTOLIN HFA) 108 (90 Base) MCG/ACT inhaler Inhale 2 puffs into the lungs every 6 (six) hours as needed for wheezing or shortness of breath. 8 g 6   amLODipine (NORVASC) 10 MG tablet TAKE 1 TABLET(10 MG) BY MOUTH DAILY 90 tablet 3   aspirin EC 81 MG tablet Take 81 mg by mouth every evening.     carvedilol (COREG) 25 MG tablet TAKE 1 TABLET BY MOUTH TWICE DAILY WITH MEALS 180 tablet 3   Cholecalciferol (VITAMIN D3) 10 MCG (400 UNIT) CAPS Take 1 capsule by mouth daily.     ESBRIET 267 MG TABS Take 2 tablets (534 mg total) by mouth in the morning, at noon, and at bedtime. **NOTE LOW DOSE AS MAINTENANCE** 540 tablet 1   glipiZIDE (GLUCOTROL) 5 MG tablet TAKE 1 TABLET(5 MG) BY MOUTH TWICE DAILY BEFORE A  MEAL 180 tablet 1   glucose blood (ACCU-CHEK AVIVA PLUS) test strip AS DIRECTED TO CHECK BLOOD SUGAR TWICE DAILY 200 strip 3   insulin glargine (LANTUS SOLOSTAR) 100 UNIT/ML Solostar Pen INJECT 0.3 TO 0.35 MLS(30 TO 35 UNITS) INTO THE SKIN EVERY DAY 15 mL 3   isosorbide mononitrate (IMDUR) 30 MG 24 hr tablet Take 1 tablet (30 mg total) by mouth daily. 180 tablet 3   metFORMIN (GLUCOPHAGE) 500 MG tablet TAKE 2 TABLETS BY MOUTH TWICE DAILY WITH FOOD 360 tablet 3   Multiple Vitamin (MULTIVITAMIN WITH MINERALS) TABS tablet Take 1 tablet by mouth every evening.     omeprazole (PRILOSEC) 40 MG capsule TAKE 1 CAPSULE(40 MG) BY MOUTH TWICE DAILY 180 capsule 0   simvastatin (ZOCOR) 40 MG tablet TAKE 1 TABLET BY MOUTH AT BEDTIME 90 tablet 3   umeclidinium bromide (INCRUSE ELLIPTA) 62.5 MCG/ACT AEPB Inhale 1 puff into the lungs daily. 90 each 3   valsartan (DIOVAN) 40 MG tablet Take 0.5-1 tablets (20-40 mg total) by mouth daily.     vitamin B-12 (CYANOCOBALAMIN) 1000 MCG tablet Take 1,000 mcg by mouth in the morning.     No current facility-administered medications for this visit.    REVIEW OF SYSTEMS (negative unless checked):   Cardiac:  '[]'$  Chest pain or chest pressure? '[]'$  Shortness of breath upon activity? '[]'$  Shortness  of breath when lying flat? '[]'$  Irregular heart rhythm?  Vascular:  '[]'$  Pain in calf, thigh, or hip brought on by walking? '[]'$  Pain in feet at night that wakes you up from your sleep? '[]'$  Blood clot in your veins? '[]'$  Leg swelling?  Pulmonary:  '[]'$  Oxygen at home? '[]'$  Productive cough? '[]'$  Wheezing?  Neurologic:  '[]'$  Sudden weakness in arms or legs? '[]'$  Sudden numbness in arms or legs? '[]'$  Sudden onset of difficult speaking or slurred speech? '[]'$  Temporary loss of vision in one eye? '[]'$  Problems with dizziness?  Gastrointestinal:  '[]'$  Blood in stool? '[]'$  Vomited blood?  Genitourinary:  '[]'$  Burning when urinating? '[]'$  Blood in urine?  Psychiatric:  '[]'$  Major  depression  Hematologic:  '[]'$  Bleeding problems? '[]'$  Problems with blood clotting?  Dermatologic:  '[x]'$  Rashes or ulcers?  Constitutional:  '[]'$  Fever or chills?  Ear/Nose/Throat:  '[]'$  Change in hearing? '[]'$  Nose bleeds? '[]'$  Sore throat?  Musculoskeletal:  '[]'$  Back pain? '[]'$  Joint pain? '[]'$  Muscle pain?   Physical Examination   Vitals:   05/23/22 0836  BP: (!) 141/71  Pulse: 68  Resp: 20  Temp: 98 F (36.7 C)  TempSrc: Temporal  SpO2: 95%  Weight: 183 lb 6.4 oz (83.2 kg)  Height: 6' (1.829 m)   Body mass index is 24.87 kg/m.  General:  WDWN in NAD; vital signs documented above Gait: Not observed HENT: WNL, normocephalic Pulmonary: normal non-labored breathing  Cardiac: Regular rate and rhythm Abdomen: soft, NT, no masses.  Non-palpable aortic pulse Skin: without rashes Vascular Exam/Pulses: Nonpalpable pedal pulses bilaterally.  Biphasic PT/DP Doppler signals bilaterally.  Palpable femoral pulses bilaterally. Extremities: Without ischemic changes, cellulitis, gangrene.  Open ulceration along the plantar aspect of right first MTP.  No signs of tissue necrosis or active infection. Musculoskeletal: no muscle wasting or atrophy  Neurologic: A&O X 3;  No focal weakness or paresthesias are detected Psychiatric:  The pt has Normal affect.   Non-Invasive Vascular Imaging   AAA Duplex (05/23/2022) Current size: 3 cm Previous size: 3.3 cm (08/21/2021 by CT) R CIA: 1.4 cm L CIA: 1.1 cm  ABIs (05/23/2022) R ABI: 0.63 (2022- 0.62) R TBI: 0.28 (2022- 0.37) R GT Pressure: 42 (2022- 67)  L ABI: 0.91 (2022- 0.94) L TBI: 0.60 (2022- 0.61) L GT Pressure: 91 (2022- 112)   Medical Decision Making   Luke [name] is a 77 y.o. (Sep 04, 1945) male who presents for PAD and EVAR follow up  Based on EVAR duplex, the graft is patent with no sign of endoleak.  AAA size slightly decreased from 3.3 to 3 cm.  He denies any abdominal or back pain ABIs are essentially unchanged since  2022.  Right ABI was 0.62 and is now 0.63.  Left ABI was 0.94 and is now 0.91.  The patient's right TBIs have decreased from 0.37 to 0.28.  His right great toe pressure has also decreased from 67 to 42.  On exam he has nonpalpable pedal pulses.  He has biphasic DP/PT Doppler signals bilaterally.  He also has palpable femoral pulses. The patient has dealt with recurrent callus/ulceration of the plantar aspect of his right first MTP.  This is being followed by podiatry.  He has previously failed conservative treatment and is now set up for right fibular sesamoidectomy in February.  The patient's right great toe pressure of 42 is not adequate for wound healing, so he would benefit from angiography with possible intervention prior to surgery He will be set up  for right lower extremity angiography with possible intervention in the next few weeks to improve perfusion prior to surgery with podiatry.  His angiography may need to be performed with CO2 due to patient's history of CKD stage III We will perform surveillance of his AAA in 1 year with repeat duplex   Vicente Serene PA-C [facility] Office: [facility] MD: Scot Dock

## 2022-05-23 NOTE — Progress Notes (Signed)
Office Note   History of Present Illness   Luke [name] is a 77 y.o. (07-Jul-1945) male who presents for surveillance of AAA s/p EVAR and PAD.  He underwent endovascular repair of AAA by Dr. Scot Dock in 2013.  He has a history of asymptomatic PAD with no previous interventions.  At follow-up today he is doing well and denies any abdominal or back pain.  He also denies any claudication or rest pain in bilateral lower extremities.  He does have a recurrent ulcer on the plantar aspect of the first MTP of the right foot.  He has dealt with a recurrent callus/ulcer to this area since 2018.  This ulcer is being followed by Dr. Amalia Hailey.  It has been previously managed with conservative treatment, including serial excisional debridements and offloading accommodative insoles.  At his most recent visit with podiatry on 1/10, it was determined he would benefit from right fibular sesamoidectomy to allow for offloading and help his ulcer heal.  This surgery is currently planned for 06/21/2022 with Dr. Amalia Hailey.  He does not have any wounds on the left lower extremity.  He does have a history of diabetes with neuropathy and CKD stage III.  Current Outpatient Medications  Medication Sig Dispense Refill   albuterol (VENTOLIN HFA) 108 (90 Base) MCG/ACT inhaler Inhale 2 puffs into the lungs every 6 (six) hours as needed for wheezing or shortness of breath. 8 g 6   amLODipine (NORVASC) 10 MG tablet TAKE 1 TABLET(10 MG) BY MOUTH DAILY 90 tablet 3   aspirin EC 81 MG tablet Take 81 mg by mouth every evening.     carvedilol (COREG) 25 MG tablet TAKE 1 TABLET BY MOUTH TWICE DAILY WITH MEALS 180 tablet 3   Cholecalciferol (VITAMIN D3) 10 MCG (400 UNIT) CAPS Take 1 capsule by mouth daily.     ESBRIET 267 MG TABS Take 2 tablets (534 mg total) by mouth in the morning, at noon, and at bedtime. **NOTE LOW DOSE AS MAINTENANCE** 540 tablet 1   glipiZIDE (GLUCOTROL) 5 MG tablet TAKE 1 TABLET(5 MG) BY MOUTH TWICE DAILY BEFORE A  MEAL 180 tablet 1   glucose blood (ACCU-CHEK AVIVA PLUS) test strip AS DIRECTED TO CHECK BLOOD SUGAR TWICE DAILY 200 strip 3   insulin glargine (LANTUS SOLOSTAR) 100 UNIT/ML Solostar Pen INJECT 0.3 TO 0.35 MLS(30 TO 35 UNITS) INTO THE SKIN EVERY DAY 15 mL 3   isosorbide mononitrate (IMDUR) 30 MG 24 hr tablet Take 1 tablet (30 mg total) by mouth daily. 180 tablet 3   metFORMIN (GLUCOPHAGE) 500 MG tablet TAKE 2 TABLETS BY MOUTH TWICE DAILY WITH FOOD 360 tablet 3   Multiple Vitamin (MULTIVITAMIN WITH MINERALS) TABS tablet Take 1 tablet by mouth every evening.     omeprazole (PRILOSEC) 40 MG capsule TAKE 1 CAPSULE(40 MG) BY MOUTH TWICE DAILY 180 capsule 0   simvastatin (ZOCOR) 40 MG tablet TAKE 1 TABLET BY MOUTH AT BEDTIME 90 tablet 3   umeclidinium bromide (INCRUSE ELLIPTA) 62.5 MCG/ACT AEPB Inhale 1 puff into the lungs daily. 90 each 3   valsartan (DIOVAN) 40 MG tablet Take 0.5-1 tablets (20-40 mg total) by mouth daily.     vitamin B-12 (CYANOCOBALAMIN) 1000 MCG tablet Take 1,000 mcg by mouth in the morning.     No current facility-administered medications for this visit.    REVIEW OF SYSTEMS (negative unless checked):   Cardiac:  '[]'$  Chest pain or chest pressure? '[]'$  Shortness of breath upon activity? '[]'$  Shortness  of breath when lying flat? '[]'$  Irregular heart rhythm?  Vascular:  '[]'$  Pain in calf, thigh, or hip brought on by walking? '[]'$  Pain in feet at night that wakes you up from your sleep? '[]'$  Blood clot in your veins? '[]'$  Leg swelling?  Pulmonary:  '[]'$  Oxygen at home? '[]'$  Productive cough? '[]'$  Wheezing?  Neurologic:  '[]'$  Sudden weakness in arms or legs? '[]'$  Sudden numbness in arms or legs? '[]'$  Sudden onset of difficult speaking or slurred speech? '[]'$  Temporary loss of vision in one eye? '[]'$  Problems with dizziness?  Gastrointestinal:  '[]'$  Blood in stool? '[]'$  Vomited blood?  Genitourinary:  '[]'$  Burning when urinating? '[]'$  Blood in urine?  Psychiatric:  '[]'$  Major  depression  Hematologic:  '[]'$  Bleeding problems? '[]'$  Problems with blood clotting?  Dermatologic:  '[x]'$  Rashes or ulcers?  Constitutional:  '[]'$  Fever or chills?  Ear/Nose/Throat:  '[]'$  Change in hearing? '[]'$  Nose bleeds? '[]'$  Sore throat?  Musculoskeletal:  '[]'$  Back pain? '[]'$  Joint pain? '[]'$  Muscle pain?   Physical Examination   Vitals:   05/23/22 0836  BP: (!) 141/71  Pulse: 68  Resp: 20  Temp: 98 F (36.7 C)  TempSrc: Temporal  SpO2: 95%  Weight: 183 lb 6.4 oz (83.2 kg)  Height: 6' (1.829 m)   Body mass index is 24.87 kg/m.  General:  WDWN in NAD; vital signs documented above Gait: Not observed HENT: WNL, normocephalic Pulmonary: normal non-labored breathing  Cardiac: Regular rate and rhythm Abdomen: soft, NT, no masses.  Non-palpable aortic pulse Skin: without rashes Vascular Exam/Pulses: Nonpalpable pedal pulses bilaterally.  Biphasic PT/DP Doppler signals bilaterally.  Palpable femoral pulses bilaterally. Extremities: Without ischemic changes, cellulitis, gangrene.  Open ulceration along the plantar aspect of right first MTP.  No signs of tissue necrosis or active infection. Musculoskeletal: no muscle wasting or atrophy  Neurologic: A&O X 3;  No focal weakness or paresthesias are detected Psychiatric:  The pt has Normal affect.   Non-Invasive Vascular Imaging   AAA Duplex (05/23/2022) Current size: 3 cm Previous size: 3.3 cm (08/21/2021 by CT) R CIA: 1.4 cm L CIA: 1.1 cm  ABIs (05/23/2022) R ABI: 0.63 (2022- 0.62) R TBI: 0.28 (2022- 0.37) R GT Pressure: 42 (2022- 67)  L ABI: 0.91 (2022- 0.94) L TBI: 0.60 (2022- 0.61) L GT Pressure: 91 (2022- 112)   Medical Decision Making   Luke [name] is a 77 y.o. (1946-02-19) male who presents for PAD and EVAR follow up  Based on EVAR duplex, the graft is patent with no sign of endoleak.  AAA size slightly decreased from 3.3 to 3 cm.  He denies any abdominal or back pain ABIs are essentially unchanged since  2022.  Right ABI was 0.62 and is now 0.63.  Left ABI was 0.94 and is now 0.91.  The patient's right TBIs have decreased from 0.37 to 0.28.  His right great toe pressure has also decreased from 67 to 42.  On exam he has nonpalpable pedal pulses.  He has biphasic DP/PT Doppler signals bilaterally.  He also has palpable femoral pulses. The patient has dealt with recurrent callus/ulceration of the plantar aspect of his right first MTP.  This is being followed by podiatry.  He has previously failed conservative treatment and is now set up for right fibular sesamoidectomy in February.  The patient's right great toe pressure of 42 is not adequate for wound healing, so he would benefit from angiography with possible intervention prior to surgery He will be set up  for right lower extremity angiography with possible intervention in the next few weeks to improve perfusion prior to surgery with podiatry.  His angiography may need to be performed with CO2 due to patient's history of CKD stage III We will perform surveillance of his AAA in 1 year with repeat duplex   Vicente Serene PA-C [facility] Office: [facility] MD: Scot Dock

## 2022-05-24 ENCOUNTER — Telehealth: Payer: Self-pay | Admitting: Cardiology

## 2022-05-24 NOTE — Telephone Encounter (Signed)
   Pre-operative Risk Assessment    Patient Name: Luke [name]  DOB: December 25, 1945 MRN: [id number]      Request for Surgical Clearance    Procedure:   Sesamoidectomy of right foot  Date of Surgery:  Clearance 06/21/22                                 Surgeon:  Dr.  Daylene Katayama Surgeon's Group or Practice Name:  [facility]  Phone number:  [phone] Fax number:  [phone]   Type of Clearance Requested:   - Medical  - Pharmacy  5. What type of anesthesia will be used?  Press F2 and select the anesthesia to be used for the procedure.  :1}  Type of Anesthesia:   Anesthesiologist choice   Additional requests/questions:   Caller stated they will need Medical and Pharmacy clearance.    Signed, Heloise Beecham   05/24/2022, 8:40 AM

## 2022-05-24 NOTE — Telephone Encounter (Signed)
     Primary Cardiologist: Candee Furbish, MD  Chart reviewed as part of pre-operative protocol coverage. Given past medical history and time since last visit, based on ACC/AHA guidelines, Luke [name] would be at acceptable risk for the planned procedure without further cardiovascular testing.   His RCRI is a class III risk, 6.6% risk of major cardiac event.  I will route this recommendation to the requesting party via Epic fax function and remove from pre-op pool.  Please call with questions.  Jossie Ng. Ivionna Verley NP-C    05/24/2022, Luke [name] [address] Office [phone] Fax [phone]

## 2022-05-27 ENCOUNTER — Ambulatory Visit (HOSPITAL_BASED_OUTPATIENT_CLINIC_OR_DEPARTMENT_OTHER): Payer: Medicare Other

## 2022-05-27 ENCOUNTER — Ambulatory Visit (HOSPITAL_COMMUNITY)
Admission: RE | Admit: 2022-05-27 | Discharge: 2022-05-27 | Disposition: A | Discharge status: Home / Self Care | Payer: Medicare Other | Source: Ambulatory Visit | Attending: Vascular Surgery | Admitting: Vascular Surgery

## 2022-05-27 ENCOUNTER — Encounter (HOSPITAL_COMMUNITY): Payer: Self-pay | Admitting: Vascular Surgery

## 2022-05-27 ENCOUNTER — Encounter (HOSPITAL_COMMUNITY)
Admission: RE | Disposition: A | Discharge status: Home / Self Care | Payer: Self-pay | Source: Ambulatory Visit | Attending: Vascular Surgery

## 2022-05-27 ENCOUNTER — Other Ambulatory Visit: Payer: Self-pay

## 2022-05-27 DIAGNOSIS — Z794 Long term (current) use of insulin: Secondary | ICD-10-CM | POA: Diagnosis not present

## 2022-05-27 DIAGNOSIS — Z7984 Long term (current) use of oral hypoglycemic drugs: Secondary | ICD-10-CM | POA: Insufficient documentation

## 2022-05-27 DIAGNOSIS — E1122 Type 2 diabetes mellitus with diabetic chronic kidney disease: Secondary | ICD-10-CM | POA: Insufficient documentation

## 2022-05-27 DIAGNOSIS — E1140 Type 2 diabetes mellitus with diabetic neuropathy, unspecified: Secondary | ICD-10-CM | POA: Insufficient documentation

## 2022-05-27 DIAGNOSIS — L97419 Non-pressure chronic ulcer of right heel and midfoot with unspecified severity: Secondary | ICD-10-CM | POA: Diagnosis not present

## 2022-05-27 DIAGNOSIS — E11621 Type 2 diabetes mellitus with foot ulcer: Secondary | ICD-10-CM | POA: Diagnosis not present

## 2022-05-27 DIAGNOSIS — I739 Peripheral vascular disease, unspecified: Secondary | ICD-10-CM

## 2022-05-27 DIAGNOSIS — E1151 Type 2 diabetes mellitus with diabetic peripheral angiopathy without gangrene: Secondary | ICD-10-CM | POA: Diagnosis not present

## 2022-05-27 DIAGNOSIS — Z01810 Encounter for preprocedural cardiovascular examination: Secondary | ICD-10-CM | POA: Diagnosis not present

## 2022-05-27 DIAGNOSIS — I70235 Atherosclerosis of native arteries of right leg with ulceration of other part of foot: Secondary | ICD-10-CM | POA: Diagnosis not present

## 2022-05-27 DIAGNOSIS — I70234 Atherosclerosis of native arteries of right leg with ulceration of heel and midfoot: Secondary | ICD-10-CM | POA: Diagnosis not present

## 2022-05-27 DIAGNOSIS — N183 Chronic kidney disease, stage 3 unspecified: Secondary | ICD-10-CM | POA: Diagnosis not present

## 2022-05-27 HISTORY — PX: LOWER EXTREMITY ANGIOGRAPHY: CATH118251

## 2022-05-27 HISTORY — PX: ABDOMINAL AORTOGRAM: CATH118222

## 2022-05-27 LAB — POCT I-STAT, CHEM 8
BUN: 33 mg/dL — ABNORMAL HIGH (ref 8–23)
Calcium, Ion: 1.27 mmol/L (ref 1.15–1.40)
Chloride: 104 mmol/L (ref 98–111)
Creatinine, Ser: 1 mg/dL (ref 0.61–1.24)
Glucose, Bld: 218 mg/dL — ABNORMAL HIGH (ref 70–99)
HCT: 42 % (ref 39.0–52.0)
Hemoglobin: 14.3 g/dL (ref 13.0–17.0)
Potassium: 5.1 mmol/L (ref 3.5–5.1)
Sodium: 140 mmol/L (ref 135–145)
TCO2: 25 mmol/L (ref 22–32)

## 2022-05-27 SURGERY — ABDOMINAL AORTOGRAM
Anesthesia: LOCAL

## 2022-05-27 MED ORDER — SODIUM CHLORIDE 0.9 % IV SOLN: INTRAVENOUS | Status: DC

## 2022-05-27 MED ORDER — FENTANYL CITRATE (PF) 100 MCG/2ML IJ SOLN
INTRAMUSCULAR | Status: AC
  Filled 2022-05-27: qty 2

## 2022-05-27 MED ORDER — HEPARIN (PORCINE) IN NACL 1000-0.9 UT/500ML-% IV SOLN
INTRAVENOUS | Status: DC | PRN
  Administered 2022-05-27 (×2): 500 mL

## 2022-05-27 MED ORDER — IODIXANOL 320 MG/ML IV SOLN
INTRAVENOUS | Status: DC | PRN
  Administered 2022-05-27: 62 mL via INTRA_ARTERIAL

## 2022-05-27 MED ORDER — HYDRALAZINE HCL 20 MG/ML IJ SOLN: 5.0000 mg | INTRAMUSCULAR | Status: DC | PRN

## 2022-05-27 MED ORDER — ACETAMINOPHEN 325 MG PO TABS: 650.0000 mg | ORAL_TABLET | ORAL | Status: DC | PRN

## 2022-05-27 MED ORDER — MIDAZOLAM HCL 2 MG/2ML IJ SOLN
INTRAMUSCULAR | Status: DC | PRN
  Administered 2022-05-27: 1 mg via INTRAVENOUS

## 2022-05-27 MED ORDER — OXYCODONE HCL 5 MG PO TABS: 5.0000 mg | ORAL_TABLET | ORAL | Status: DC | PRN

## 2022-05-27 MED ORDER — SODIUM CHLORIDE 0.9% FLUSH: 3.0000 mL | Freq: Two times a day (BID) | INTRAVENOUS | Status: DC

## 2022-05-27 MED ORDER — SODIUM CHLORIDE 0.9% FLUSH: 3.0000 mL | INTRAVENOUS | Status: DC | PRN

## 2022-05-27 MED ORDER — LIDOCAINE HCL (PF) 1 % IJ SOLN
INTRAMUSCULAR | Status: DC | PRN
  Administered 2022-05-27: 10 mL

## 2022-05-27 MED ORDER — ONDANSETRON HCL 4 MG/2ML IJ SOLN: 4.0000 mg | Freq: Four times a day (QID) | INTRAMUSCULAR | Status: DC | PRN

## 2022-05-27 MED ORDER — MIDAZOLAM HCL 2 MG/2ML IJ SOLN
INTRAMUSCULAR | Status: AC
  Filled 2022-05-27: qty 2

## 2022-05-27 MED ORDER — FENTANYL CITRATE (PF) 100 MCG/2ML IJ SOLN
INTRAMUSCULAR | Status: DC | PRN
  Administered 2022-05-27: 50 ug via INTRAVENOUS

## 2022-05-27 MED ORDER — LABETALOL HCL 5 MG/ML IV SOLN: 10.0000 mg | INTRAVENOUS | Status: DC | PRN

## 2022-05-27 MED ORDER — SODIUM CHLORIDE 0.9 % IV SOLN: 250.0000 mL | INTRAVENOUS | Status: DC | PRN

## 2022-05-27 SURGICAL SUPPLY — 14 items
CATH NAVICROSS ST 65CM (CATHETERS) IMPLANT
CATH OMNI FLUSH 5F 65CM (CATHETERS) IMPLANT
CATHETER NAVICROSS ST 65CM (CATHETERS) ×2
CLOSURE MYNX CONTROL 5F (Vascular Products) IMPLANT
GUIDEWIRE ANGLED .035X150CM (WIRE) IMPLANT
KIT ANGIASSIST CO2 SYSTEM (KITS) IMPLANT
KIT MICROPUNCTURE NIT STIFF (SHEATH) IMPLANT
KIT PV (KITS) ×2 IMPLANT
SHEATH PINNACLE 5F 10CM (SHEATH) IMPLANT
STOPCOCK MORSE 400PSI 3WAY (MISCELLANEOUS) IMPLANT
SYR MEDRAD MARK 7 150ML (SYRINGE) ×2 IMPLANT
TRANSDUCER W/STOPCOCK (MISCELLANEOUS) ×2 IMPLANT
TRAY PV CATH (CUSTOM PROCEDURE TRAY) ×2 IMPLANT
WIRE BENTSON .035X145CM (WIRE) IMPLANT

## 2022-05-27 NOTE — Interval H&P Note (Signed)
History and Physical Interval Note:  05/27/2022 7:29 AM  Luke [name]  has presented today for surgery, with the diagnosis of Non Healing Wound of Right Foot.  The various methods of treatment have been discussed with the patient and family. After consideration of risks, benefits and other options for treatment, the patient has consented to  Procedure(s): ABDOMINAL AORTOGRAM W/ Bilateral LOWER EXTREMITY Runoff (N/A) as a surgical intervention.  The patient's history has been reviewed, patient examined, no change in status, stable for surgery.  I have reviewed the patient's chart and labs.  Questions were answered to the patient's satisfaction.     Servando Snare

## 2022-05-27 NOTE — Progress Notes (Signed)
Lower extremity vein mapping study completed.   Please see CV Proc for preliminary results.   Darlin Coco, RDMS, RVT

## 2022-05-27 NOTE — Op Note (Signed)
    Patient name: Luke [name] MRN: [id number] DOB: 08/11/1945 Sex: male  05/27/2022 Pre-operative Diagnosis: Chronic right lower extremity limb threatening ischemia with ulceration Post-operative diagnosis:  Same Surgeon:  Erlene Quan C. Donzetta Matters, MD Procedure Performed: 1.  Ultrasound-guided cannulation left common femoral artery 2.  CO2 aortogram 3.  Selection of right common iliac artery and right lower extremity angiography 4.  Moderate sedation with fentanyl Versed for 29 minutes 5.  Mynx device closure left common femoral artery   Indications: 77 year old male with history of endovascular aneurysm repair now with wound on the right foot and ABI of 0.6 with toe pressure of 42 mmHg consistent with right lower extremity chronic limb threatening ischemia.  He is indicated for angiography with possible intervention.  Findings: There is an EVAR in place with crossed limbs.  The right renal artery was identified as the lowest the left renal artery was not identified.  We were able to cross the bifurcation and placed a Navi cross catheter into the right common iliac artery and perform right lower extremity angiography which demonstrated severely diseased common femoral artery as well as heavily diseased SFA that frankly occludes and then there is a popliteal island which occludes and then he has dominant runoff via the posterior tibial artery and the peroneal also appears patent.  Ultrasound was used to identify the right common femoral artery which was severely diseased as well consistent with angiography.  Plan will be for right common femoral endarterectomy and right femoral to tibioperoneal trunk or posterior tibial artery bypass.  Vein mapping will be performed today prior to discharge.   Procedure:  The patient was identified in the holding area and taken to room 8.  The patient was then placed supine on the table and prepped and draped in the usual sterile fashion.  A time out was called.   Ultrasound was used to evaluate the left common artery.  There was some posterior disease we are able to cannulate anteriorly where there was no disease.  First the area was anesthetized 1% lidocaine and the artery was cannulated with a micropuncture needle followed by wire and a sheath.  And images saved the permanent record.  Concomitantly we administered fentanyl and Versed for moderate sedation and his vital signs were monitored by bedside nursing throughout the case.  We placed a Bentson wire followed by 5 French sheath and Omni catheter to the level of L1 just above the endovascular aneurysm repair and CO2 aortogram was performed.  I was able to use an Omni catheter and a long Glidewire over the bifurcation and then placed a Navi cross catheter to the right common iliac artery and performed first hand-injection that demonstrated common femoral disease and then performed right lower extremity angiography as well as angled image below the knee which demonstrated the posterior tibial artery takeoff is being patent.  Satisfied with this the catheter was removed.  A Mace device was deployed.  He tolerated procedure without any complication.  Contrast: 62 cc  Stacee Earp C. Donzetta Matters, MD [facility] Office: [phone] Pager: [phone]

## 2022-05-28 ENCOUNTER — Telehealth: Payer: Self-pay | Admitting: Gastroenterology

## 2022-05-28 ENCOUNTER — Encounter: Payer: Self-pay | Admitting: Family Medicine

## 2022-05-28 NOTE — Telephone Encounter (Signed)
Inbound call from patient stating that he needs a refill for Prilosec. Please advise.

## 2022-05-29 ENCOUNTER — Other Ambulatory Visit: Payer: Self-pay | Admitting: Gastroenterology

## 2022-05-29 MED ORDER — OMEPRAZOLE 40 MG PO CPDR: 40.0000 mg | DELAYED_RELEASE_CAPSULE | Freq: Every day | ORAL | 0 refills | Status: DC

## 2022-05-29 NOTE — Telephone Encounter (Signed)
I was off at 11:30 yesterday if someone had looked at the schedule this could have been taken care of yesterday.  Patient needs an appointment has not been seen since 2021. I will refill today and schedule this patient for a follow up appointment to see Dr Silverio Decamp

## 2022-05-29 NOTE — Telephone Encounter (Signed)
Patient scheduled for 3/15

## 2022-06-05 ENCOUNTER — Telehealth: Payer: Self-pay

## 2022-06-05 ENCOUNTER — Other Ambulatory Visit: Payer: Self-pay | Admitting: Nurse Practitioner

## 2022-06-05 NOTE — Telephone Encounter (Signed)
Attempted to schedule patient for surgery. Left message for patient to return call.

## 2022-06-05 NOTE — Telephone Encounter (Signed)
Bill called to cancel her surgery with Dr. Amalia Hailey on 06/21/2022/ He stated he has to have some vascular testing done. Once that is done, he will call back to reschedule.

## 2022-06-07 ENCOUNTER — Telehealth: Payer: Self-pay

## 2022-06-07 NOTE — Telephone Encounter (Signed)
..     Pre-operative Risk Assessment    Patient Name: Luke [name]  DOB: 07/30/1945 MRN: [id number]      Request for Surgical Clearance    Procedure:   RIGHT COMMON FEMORAL ENDARTERECTOMY  Date of Surgery:  Clearance TBD                                 Surgeon:  DR Servando Snare Surgeon's Group or Practice Name:  [facility] Phone number:  [phone] Fax number:  [phone]   Type of Clearance Requested:   - Medical  - Pharmacy:  Hold Aspirin     Type of Anesthesia:  General    Additional requests/questions:    Gwenlyn Found   06/07/2022, 11:59 AM

## 2022-06-07 NOTE — Telephone Encounter (Signed)
Left message for patient to return call at both contact numbers listed.

## 2022-06-10 ENCOUNTER — Telehealth: Payer: Self-pay | Admitting: *Deleted

## 2022-06-10 ENCOUNTER — Other Ambulatory Visit: Payer: Self-pay | Admitting: *Deleted

## 2022-06-10 ENCOUNTER — Other Ambulatory Visit (INDEPENDENT_AMBULATORY_CARE_PROVIDER_SITE_OTHER): Payer: Medicare Other

## 2022-06-10 DIAGNOSIS — Z5181 Encounter for therapeutic drug level monitoring: Secondary | ICD-10-CM

## 2022-06-10 DIAGNOSIS — J84112 Idiopathic pulmonary fibrosis: Secondary | ICD-10-CM | POA: Diagnosis not present

## 2022-06-10 LAB — HEPATIC FUNCTION PANEL
ALT: 36 U/L (ref 0–53)
AST: 15 U/L (ref 0–37)
Albumin: 4.2 g/dL (ref 3.5–5.2)
Alkaline Phosphatase: 79 U/L (ref 39–117)
Bilirubin, Direct: 0.1 mg/dL (ref 0.0–0.3)
Total Bilirubin: 0.5 mg/dL (ref 0.2–1.2)
Total Protein: 7.2 g/dL (ref 6.0–8.3)

## 2022-06-10 NOTE — Telephone Encounter (Signed)
Left a message for patient to return call at both contact numbers. Will send an UTR letter to contact office to schedule surgery.

## 2022-06-10 NOTE — Telephone Encounter (Addendum)
Received return call from patient. Offered to schedule surgery for 06/18/22, but patient declined. He stated that he has a pulmonary appointment on 3/22 and he has to be able to walk for testing. He requested to contact office when ready to schedule.

## 2022-06-10 NOTE — Telephone Encounter (Signed)
  Patient Consent for Virtual Visit         Luke [name] has provided verbal consent on 06/10/2022 for a virtual visit (video or telephone).   CONSENT FOR VIRTUAL VISIT FOR:  Luke [name]  By participating in this virtual visit I agree to the following:  I hereby voluntarily request, consent and authorize [facility] and its employed or contracted physicians, physician assistants, nurse practitioners or other licensed health care professionals (the Practitioner), to provide me with telemedicine health care services (the "Services") as deemed necessary by the treating Practitioner. I acknowledge and consent to receive the Services by the Practitioner via telemedicine. I understand that the telemedicine visit will involve communicating with the Practitioner through live audiovisual communication technology and the disclosure of certain medical information by electronic transmission. I acknowledge that I have been given the opportunity to request an in-person assessment or other available alternative prior to the telemedicine visit and am voluntarily participating in the telemedicine visit.  I understand that I have the right to withhold or withdraw my consent to the use of telemedicine in the course of my care at any time, without affecting my right to future care or treatment, and that the Practitioner or I may terminate the telemedicine visit at any time. I understand that I have the right to inspect all information obtained and/or recorded in the course of the telemedicine visit and may receive copies of available information for a reasonable fee.  I understand that some of the potential risks of receiving the Services via telemedicine include:  Delay or interruption in medical evaluation due to technological equipment failure or disruption; Information transmitted may not be sufficient (e.g. poor resolution of images) to allow for appropriate medical decision making by the  Practitioner; and/or  In rare instances, security protocols could fail, causing a breach of personal health information.  Furthermore, I acknowledge that it is my responsibility to provide information about my medical history, conditions and care that is complete and accurate to the best of my ability. I acknowledge that Practitioner's advice, recommendations, and/or decision may be based on factors not within their control, such as incomplete or inaccurate data provided by me or distortions of diagnostic images or specimens that may result from electronic transmissions. I understand that the practice of medicine is not an exact science and that Practitioner makes no warranties or guarantees regarding treatment outcomes. I acknowledge that a copy of this consent can be made available to me via my patient portal ([facility]), or I can request a printed copy by calling the office of [facility].    I understand that my insurance will be billed for this visit.   I have read or had this consent read to me. I understand the contents of this consent, which adequately explains the benefits and risks of the Services being provided via telemedicine.  I have been provided ample opportunity to ask questions regarding this consent and the Services and have had my questions answered to my satisfaction. I give my informed consent for the services to be provided through the use of telemedicine in my medical care

## 2022-06-10 NOTE — Telephone Encounter (Signed)
Pt has been scheduled for tele pre op appt 06/24/22. I will send FYI to requesting office.

## 2022-06-10 NOTE — Telephone Encounter (Addendum)
   Name: OSHAE SIMMERING  DOB: 1946/02/09  MRN: [id number]  Primary Cardiologist: Candee Furbish, MD   Preoperative team, please contact this patient and set up a phone call appointment for further preoperative risk assessment. Please obtain consent and complete medication review. Thank you for your help.  I confirm that guidance regarding antiplatelet and oral anticoagulation therapy has been completed and, if necessary, noted below.  Regarding ASA therapy, we recommend continuation of ASA throughout the perioperative period.  However, if the surgeon feels that cessation of ASA is required in the perioperative period, it may be stopped 5-7 days prior to surgery with a plan to resume it as soon as felt to be feasible from a surgical standpoint in the post-operative period.  Addendum 06/10/2022: Per Clinton Sawyer, RN with vascular surgery, patient not required to hold aspirin prior to procedure.   Lenna Sciara, NP 06/10/2022, 9:23 AM [place]

## 2022-06-21 ENCOUNTER — Ambulatory Visit: Admit: 2022-06-21 | Payer: Medicare Other | Admitting: Podiatry

## 2022-06-21 SURGERY — SESMOIDECTOMY
Anesthesia: Choice | Laterality: Right

## 2022-06-23 NOTE — Progress Notes (Unsigned)
Virtual Visit via Telephone Note   Because of [name]'s co-morbid illnesses, he is at least at moderate risk for complications without adequate follow up.  This format is felt to be most appropriate for this patient at this time.  The patient did not have access to video technology/had technical difficulties with video requiring transitioning to audio format only (telephone).  All issues noted in this document were discussed and addressed.  No physical exam could be performed with this format.  Please refer to the patient's chart for his consent to telehealth for [facility].  Evaluation Performed:  Preoperative cardiovascular risk assessment _____________   Date:  06/23/2022   Patient ID:  Luke [name], DOB 02-27-1946, MRN [id number] Patient Location:  Home Provider location:   Office  Primary Care Provider:  Tonia Ghent, MD Primary Cardiologist:  Candee Furbish, MD  Chief Complaint / Patient Profile   77 y.o. y/o male with a h/o chronic HFrEF, ICM, CAD, cardiac catheterization 07/17/2021 with 80% LAD calcified lesion treated medically, distal RCA CTO disease with collateral blood flow from circumflex, EF 40 to 45%, CRT-D ICD, CKD, HLD, PAD, idiopathic pulmonary fibrosis, who is pending right common femoral endarterectomy and presents today for telephonic preoperative cardiovascular risk assessment.  History of Present Illness    Luke [name] is a 77 y.o. male who presents via audio/video conferencing for a telehealth visit today.  Pt was last seen in cardiology clinic on 03/19/22 by Dr. Marlou Porch.  At that time Luke [name] was doing well.  The patient is now pending procedure as outlined above. Since his last visit, he denies chest pain, shortness of breath, lower extremity edema, fatigue, palpitations, melena, hematuria, hemoptysis, diaphoresis, weakness, presyncope, syncope, orthopnea, and PND. He works out at [employer] several days per week. Has occasional  chest discomfort that is relieved when he belches. Never has progressive worsening of pain. Can achieve > 4 METS without concerning cardiac symptoms.   Past Medical History    Past Medical History:  Diagnosis Date   AAA (abdominal aortic aneurysm) ([place]) 2011   Per vascular surgery   Anxiety    no med. in use for anxiety but pt. speaks openly of his stress & anxious feelings regarding impending surgery     Atrial fibrillation ([place])    Automatic implantable cardioverter-defibrillator in situ    CAD (coronary artery disease)    Presumed CAD with nuclear scan October 09, 2011,  large anteroseptal MI and inferior MI. Catheterization scheduled October 15, 2011   Cardiomyopathy [facility])    Nuclear, October 09, 2011, EF 30%, multiple focal wall motion abnormalities   Diabetes mellitus    type II   Drug therapy    Hyperkalemia with spironolactone   Ejection fraction < 50%    EF 30%, nuclear, October 09, 2011   Ejection fraction < 50%    EF previously 30%  //   EF 30-35%, echo, July 14, 2012, severe diffuse hypokinesis, PA pressure 43 mm mercury   Fall due to ice or snow Feb.  19, 2015   HLD (hyperlipidemia)    Hypertension    white coat HTN-- often elevated in office and controlled on outside checks.   ICD (implantable cardioverter-defibrillator) in place    CRT-D placed March, 2014 complete heart block and k dysfunction   LBBB (left bundle branch block)    LBBB on EKG October 11, 2011,  no prior EKG has been done   Low testosterone  Hx of   Myocardial infarction ([place])    Told in 2015-never knew-showed up on stress test for AAA;    Pacemaker    PAD (peripheral artery disease) ([place])    Pancreatitis    Past Surgical History:  Procedure Laterality Date   ABDOMINAL AORTAGRAM N/A 10/28/2011   Procedure: ABDOMINAL [name];  Surgeon: Angelia Mould, MD;  Location: [facility] CATH LAB;  Service: Cardiovascular;  Laterality: N/A;   ABDOMINAL AORTIC ANEURYSM REPAIR     EVAR    ABDOMINAL AORTOGRAM N/A  05/27/2022   Procedure: ABDOMINAL AORTOGRAM;  Surgeon: Waynetta Sandy, MD;  Location: [place] CV LAB;  Service: Cardiovascular;  Laterality: N/A;   BI-VENTRICULAR IMPLANTABLE CARDIOVERTER DEFIBRILLATOR N/A 07/16/2012   Procedure: BI-VENTRICULAR IMPLANTABLE CARDIOVERTER DEFIBRILLATOR  (CRT-D);  Surgeon: Deboraha Sprang, MD;  Location: [facility] CATH LAB;  Service: Cardiovascular;  Laterality: N/A;   BIV ICD GENERATOR CHANGEOUT N/A 10/25/2020   Procedure: BIV ICD GENERATOR CHANGEOUT;  Surgeon: Deboraha Sprang, MD;  Location: [place] CV LAB;  Service: Cardiovascular;  Laterality: N/A;   CARDIAC CATHETERIZATION     COLONOSCOPY  03/23/2018; 2020   ESOPHAGOGASTRODUODENOSCOPY N/A 02/12/2019   Procedure: ESOPHAGOGASTRODUODENOSCOPY (EGD);  Surgeon: Jackquline Denmark, MD;  Location: [facility] ENDOSCOPY;  Service: Endoscopy;  Laterality: N/A;   ESOPHAGOGASTRODUODENOSCOPY (EGD) WITH PROPOFOL N/A 02/05/2018   Procedure: ESOPHAGOGASTRODUODENOSCOPY (EGD) WITH PROPOFOL;  Surgeon: Mauri Pole, MD;  Location: [facility] ENDOSCOPY;  Service: Endoscopy;  Laterality: N/A;   ESOPHAGOGASTRODUODENOSCOPY (EGD) WITH PROPOFOL N/A 09/27/2018   Procedure: ESOPHAGOGASTRODUODENOSCOPY (EGD) WITH PROPOFOL;  Surgeon: Carol Ada, MD;  Location: [place];  Service: Endoscopy;  Laterality: N/A;   FOREIGN BODY REMOVAL  09/27/2018   Procedure: FOREIGN BODY REMOVAL;  Surgeon: Carol Ada, MD;  Location: [facility] ENDOSCOPY;  Service: Endoscopy;;   FOREIGN BODY REMOVAL  02/12/2019   Procedure: FOREIGN BODY REMOVAL;  Surgeon: Jackquline Denmark, MD;  Location: [facility] ENDOSCOPY;  Service: Endoscopy;;   HERNIA REPAIR  Jan. 9, 2015   INSERTION OF MESH N/A 05/14/2013   Procedure: INSERTION OF MESH;  Surgeon: Adin Hector, MD;  Location: [place];  Service: General;  Laterality: N/A;   LEFT HEART CATH AND CORONARY ANGIOGRAPHY N/A 07/17/2021   Procedure: LEFT HEART CATH AND CORONARY ANGIOGRAPHY;  Surgeon: Troy Sine, MD;  Location: [place] CV LAB;   Service: Cardiovascular;  Laterality: N/A;   LOWER EXTREMITY ANGIOGRAM Bilateral 10/28/2011   Procedure: LOWER EXTREMITY ANGIOGRAM;  Surgeon: Angelia Mould, MD;  Location: [facility] CATH LAB;  Service: Cardiovascular;  Laterality: Bilateral;   LOWER EXTREMITY ANGIOGRAPHY  05/27/2022   Procedure: Lower Extremity Angiography;  Surgeon: Waynetta Sandy, MD;  Location: [place] CV LAB;  Service: Cardiovascular;;   PACEMAKER INSERTION  07-16-12   pacemaker/defibrilator   POLYPECTOMY     POSTERIOR CERVICAL FUSION/FORAMINOTOMY N/A 06/09/2014   Procedure: Laminectomy - Cervical two-Cervcial four posterior cervical instrumented fusion Cervical two-cervical four;  Surgeon: Eustace Moore, MD;  Location: [place] NEURO ORS;  Service: Neurosurgery;  Laterality: N/A;  posterior    SAVORY DILATION N/A 02/05/2018   Procedure: SAVORY DILATION;  Surgeon: Mauri Pole, MD;  Location: [facility] ENDOSCOPY;  Service: Endoscopy;  Laterality: N/A;   TONSILLECTOMY     as a child    UMBILICAL HERNIA REPAIR N/A 05/14/2013   Procedure: LAPAROSCOPIC exploration and repair of hernia in abdominal ;  Surgeon: Adin Hector, MD;  Location: [place];  Service: General;  Laterality: N/A;   UPPER GASTROINTESTINAL ENDOSCOPY  2020  Allergies  Allergies  Allergen Reactions   Aldactone [Spironolactone] Other (See Comments)    Hyperkalemia   Codeine Rash and Hives   Atorvastatin     Myalgias with lipitor.  Does tolerate simvastatin.     Januvia [Sitagliptin] Other (See Comments)    Diarrhea and heart racing   Jardiance [Empagliflozin] Other (See Comments)    Polyuria; excessive weight loss   Lisinopril     Possible cause of pancreatitis    Home Medications    Prior to Admission medications   Medication Sig Start Date End Date Taking? Authorizing Provider  albuterol (VENTOLIN HFA) 108 (90 Base) MCG/ACT inhaler Inhale 2 puffs into the lungs every 6 (six) hours as needed for wheezing or shortness of breath. 01/02/21    Brand Males, MD  amLODipine (NORVASC) 10 MG tablet TAKE 1 TABLET(10 MG) BY MOUTH DAILY 11/20/21   Jerline Pain, MD  aspirin EC 81 MG tablet Take 81 mg by mouth every evening.    [provider]  carvedilol (COREG) 25 MG tablet TAKE 1 TABLET BY MOUTH TWICE DAILY WITH MEALS 12/17/21   Jerline Pain, MD  Cholecalciferol (VITAMIN D3) 10 MCG (400 UNIT) CAPS Take 1 capsule by mouth daily.    [provider]  ESBRIET 267 MG TABS Take 2 tablets (534 mg total) by mouth in the morning, at noon, and at bedtime. **NOTE LOW DOSE AS MAINTENANCE** 02/22/22   Brand Males, MD  glipiZIDE (GLUCOTROL) 5 MG tablet TAKE 1 TABLET(5 MG) BY MOUTH TWICE DAILY BEFORE A MEAL 03/12/22   Tonia Ghent, MD  glucose blood (ACCU-CHEK AVIVA PLUS) test strip AS DIRECTED TO CHECK BLOOD SUGAR TWICE DAILY 04/22/22   Tonia Ghent, MD  insulin glargine (LANTUS SOLOSTAR) 100 UNIT/ML Solostar Pen INJECT 0.3 TO 0.35 MLS(30 TO 35 UNITS) INTO THE SKIN EVERY DAY Patient taking differently: Inject 30 Units into the skin at bedtime. 03/29/20   Tonia Ghent, MD  isosorbide mononitrate (IMDUR) 30 MG 24 hr tablet Take 1 tablet (30 mg total) by mouth daily. Patient taking differently: Take 60 mg by mouth daily. 04/16/22   Tonia Ghent, MD  metFORMIN (GLUCOPHAGE) 500 MG tablet TAKE 2 TABLETS BY MOUTH TWICE DAILY WITH FOOD 03/04/22   Tonia Ghent, MD  Multiple Vitamin (MULTIVITAMIN WITH MINERALS) TABS tablet Take 1 tablet by mouth every evening.    [provider]  omeprazole (PRILOSEC) 40 MG capsule TAKE 1 CAPSULE(40 MG) BY MOUTH DAILY 05/29/22   Mauri Pole, MD  simvastatin (ZOCOR) 40 MG tablet TAKE 1 TABLET BY MOUTH AT BEDTIME 03/22/22   Tonia Ghent, MD  umeclidinium bromide (INCRUSE ELLIPTA) 62.5 MCG/ACT AEPB Inhale 1 puff into the lungs daily. 10/24/21   Brand Males, MD  valsartan (DIOVAN) 40 MG tablet TAKE 1 TABLET(40 MG) BY MOUTH DAILY Patient taking differently: Take 20  mg by mouth daily. 06/05/22   Jerline Pain, MD  vitamin B-12 (CYANOCOBALAMIN) 1000 MCG tablet Take 1,000 mcg by mouth in the morning.    [provider]    Physical Exam    Vital Signs:  Luke [name] does not have vital signs available for review today.  Given telephonic nature of communication, physical exam is limited. AAOx3. NAD. Normal affect.  Speech and respirations are unlabored.  Accessory Clinical Findings    None  Assessment & Plan    1.  Preoperative Cardiovascular Risk Assessment: The patient is doing well from a cardiac perspective. Therefore, based  on ACC/AHA guidelines, the patient would be at acceptable risk for the planned procedure without further cardiovascular testing. According to the Revised Cardiac Risk Index (RCRI), his Perioperative Risk of Major Cardiac Event is (%): 11. His Functional Capacity in METs is: 7.59 according to the Duke Activity Status Index (DASI).  The patient was advised that if he develops new symptoms prior to surgery to contact our office to arrange for a follow-up visit, and he verbalized understanding.  Regarding ASA therapy, we recommend continuation of ASA throughout the perioperative period.  However, if the surgeon feels that cessation of ASA is required in the perioperative period, it may be stopped 5-7 days prior to surgery with a plan to resume it as soon as felt to be feasible from a surgical standpoint in the post-operative period.   Addendum 06/10/2022: Per Clinton Sawyer, RN with vascular surgery, patient not required to hold aspirin prior to procedure.   A copy of this note will be routed to requesting surgeon.  Time:   Today, I have spent 10 minutes with the patient with telehealth technology discussing medical history, symptoms, and management plan.    Emmaline Life, NP-C  06/24/2022, 2:00 PM [address] Office [phone] Fax [phone]

## 2022-06-24 ENCOUNTER — Encounter: Payer: Self-pay | Admitting: Nurse Practitioner

## 2022-06-24 ENCOUNTER — Ambulatory Visit: Payer: Medicare Other | Attending: Cardiovascular Disease | Admitting: Nurse Practitioner

## 2022-06-24 DIAGNOSIS — Z01810 Encounter for preprocedural cardiovascular examination: Secondary | ICD-10-CM | POA: Diagnosis not present

## 2022-06-25 NOTE — Telephone Encounter (Signed)
Patient made aware of cardiac clearance received for surgery. He expressed he would still like to wait to schedule after pulmonology appointment in March and then either have a phone visit or in-person visit with Dr. Donzetta Matters to discuss concerns with diabetes, healing and recovery. Advised I will pass information along to Dr. Donzetta Matters and scheduling will contact him with appointment. He verbalized understanding.

## 2022-06-26 ENCOUNTER — Other Ambulatory Visit: Payer: Self-pay | Admitting: Pharmacist

## 2022-06-26 DIAGNOSIS — J84112 Idiopathic pulmonary fibrosis: Secondary | ICD-10-CM

## 2022-06-26 MED ORDER — ESBRIET 267 MG PO TABS: 2.0000 | ORAL_TABLET | Freq: Three times a day (TID) | ORAL | 1 refills | Status: DC

## 2022-06-26 NOTE — Telephone Encounter (Signed)
Refill sent for ESBRIET to [facility] ([facility]) for Esbriet: [phone]  Dose: 534 mg three times daily as maintenance  Last OV: 06/10/2022 Provider: Dr. Chase Caller  Next OV: 07/29/2022  LFTs on 06/10/2022 stable  Knox Saliva, PharmD, MPH, BCPS Clinical Pharmacist (Rheumatology and Pulmonology)

## 2022-06-28 ENCOUNTER — Other Ambulatory Visit: Payer: Self-pay | Admitting: Internal Medicine

## 2022-06-28 ENCOUNTER — Encounter: Payer: Medicare Other | Admitting: Podiatry

## 2022-07-03 ENCOUNTER — Telehealth: Payer: Self-pay | Admitting: Vascular Surgery

## 2022-07-03 NOTE — Telephone Encounter (Signed)
-----   Message from Nicholas Lose, RN sent at 06/25/2022  1:49 PM EST ----- Regarding: Phone visit Please schedule patient for a phone visit w/[facility] after 3/25, to discuss concerns regarding needed surgery.   Thanks,  Herma Ard ----- Message ----- From: Waynetta Sandy, MD Sent: 06/25/2022   1:22 PM EST To: Nicholas Lose, RN Subject: RE: Please advise                              Phone visit is fine.   Bc  ----- Message ----- From: Nicholas Lose, RN Sent: 06/25/2022  12:34 PM EST To: Waynetta Sandy, MD Subject: Please advise                                  Cardiac clearance received to schedule right CFE with right Fem-pop bypass. Pt still would like to wait to schedule after pulmonology appointment in March and then either have a phone visit or in-person visit to discuss concerns with diabetes, healing and recovery. Do you have a preference on which type of visit he needs scheduled?   Thanks,  [place]

## 2022-07-04 NOTE — Telephone Encounter (Signed)
Appt has been scheduled.

## 2022-07-05 ENCOUNTER — Encounter: Payer: Medicare Other | Admitting: Podiatry

## 2022-07-05 ENCOUNTER — Ambulatory Visit (INDEPENDENT_AMBULATORY_CARE_PROVIDER_SITE_OTHER): Payer: Medicare Other | Admitting: Podiatry

## 2022-07-05 DIAGNOSIS — L97502 Non-pressure chronic ulcer of other part of unspecified foot with fat layer exposed: Secondary | ICD-10-CM | POA: Diagnosis not present

## 2022-07-05 DIAGNOSIS — E0843 Diabetes mellitus due to underlying condition with diabetic autonomic (poly)neuropathy: Secondary | ICD-10-CM | POA: Diagnosis not present

## 2022-07-05 NOTE — Progress Notes (Signed)
Chief Complaint  Patient presents with   Diabetic Ulcer    Diabetic Right forefoot ulcer, A1c- 7.4 BG- 108, patient is have some drainage, rate of pain 7 out of 10,     Subjective:  77 y.o. male with PMHx of diabetes mellitus presenting today for recurrence of the ulcer to the plantar aspect of the first MTP of the right foot.  Patient states that he has been dealing with a callus/ulcer to the area since 2018.  He says that most recently there was a callus that developed and it has since become an ulcer.   Past Medical History:  Diagnosis Date   AAA (abdominal aortic aneurysm) ([place]) 2011   Per vascular surgery   Anxiety    no med. in use for anxiety but pt. speaks openly of his stress & anxious feelings regarding impending surgery     Atrial fibrillation ([place])    Automatic implantable cardioverter-defibrillator in situ    CAD (coronary artery disease)    Presumed CAD with nuclear scan October 09, 2011,  large anteroseptal MI and inferior MI. Catheterization scheduled October 15, 2011   Cardiomyopathy [facility])    Nuclear, October 09, 2011, EF 30%, multiple focal wall motion abnormalities   Diabetes mellitus    type II   Drug therapy    Hyperkalemia with spironolactone   Ejection fraction < 50%    EF 30%, nuclear, October 09, 2011   Ejection fraction < 50%    EF previously 30%  //   EF 30-35%, echo, July 14, 2012, severe diffuse hypokinesis, PA pressure 43 mm mercury   Fall due to ice or snow Feb.  19, 2015   HLD (hyperlipidemia)    Hypertension    white coat HTN-- often elevated in office and controlled on outside checks.   ICD (implantable cardioverter-defibrillator) in place    CRT-D placed March, 2014 complete heart block and k dysfunction   LBBB (left bundle branch block)    LBBB on EKG October 11, 2011,  no prior EKG has been done   Low testosterone    Hx of   Myocardial infarction ([place])    Told in 2015-never knew-showed up on stress test for AAA;    Pacemaker    PAD (peripheral artery  disease) ([place])    Pancreatitis     Past Surgical History:  Procedure Laterality Date   ABDOMINAL AORTAGRAM N/A 10/28/2011   Procedure: ABDOMINAL [name];  Surgeon: Angelia Mould, MD;  Location: [facility] CATH LAB;  Service: Cardiovascular;  Laterality: N/A;   ABDOMINAL AORTIC ANEURYSM REPAIR     EVAR    ABDOMINAL AORTOGRAM N/A 05/27/2022   Procedure: ABDOMINAL AORTOGRAM;  Surgeon: Waynetta Sandy, MD;  Location: [place] CV LAB;  Service: Cardiovascular;  Laterality: N/A;   BI-VENTRICULAR IMPLANTABLE CARDIOVERTER DEFIBRILLATOR N/A 07/16/2012   Procedure: BI-VENTRICULAR IMPLANTABLE CARDIOVERTER DEFIBRILLATOR  (CRT-D);  Surgeon: Deboraha Sprang, MD;  Location: [facility] CATH LAB;  Service: Cardiovascular;  Laterality: N/A;   BIV ICD GENERATOR CHANGEOUT N/A 10/25/2020   Procedure: BIV ICD GENERATOR CHANGEOUT;  Surgeon: Deboraha Sprang, MD;  Location: [place] CV LAB;  Service: Cardiovascular;  Laterality: N/A;   CARDIAC CATHETERIZATION     COLONOSCOPY  03/23/2018; 2020   ESOPHAGOGASTRODUODENOSCOPY N/A 02/12/2019   Procedure: ESOPHAGOGASTRODUODENOSCOPY (EGD);  Surgeon: Jackquline Denmark, MD;  Location: [facility] ENDOSCOPY;  Service: Endoscopy;  Laterality: N/A;   ESOPHAGOGASTRODUODENOSCOPY (EGD) WITH PROPOFOL N/A 02/05/2018   Procedure: ESOPHAGOGASTRODUODENOSCOPY (EGD) WITH PROPOFOL;  Surgeon: Harl Bowie  V, MD;  Location: [facility] ENDOSCOPY;  Service: Endoscopy;  Laterality: N/A;   ESOPHAGOGASTRODUODENOSCOPY (EGD) WITH PROPOFOL N/A 09/27/2018   Procedure: ESOPHAGOGASTRODUODENOSCOPY (EGD) WITH PROPOFOL;  Surgeon: Carol Ada, MD;  Location: [place];  Service: Endoscopy;  Laterality: N/A;   FOREIGN BODY REMOVAL  09/27/2018   Procedure: FOREIGN BODY REMOVAL;  Surgeon: Carol Ada, MD;  Location: [facility] ENDOSCOPY;  Service: Endoscopy;;   FOREIGN BODY REMOVAL  02/12/2019   Procedure: FOREIGN BODY REMOVAL;  Surgeon: Jackquline Denmark, MD;  Location: [facility] ENDOSCOPY;  Service: Endoscopy;;   HERNIA REPAIR   Jan. 9, 2015   INSERTION OF MESH N/A 05/14/2013   Procedure: INSERTION OF MESH;  Surgeon: Adin Hector, MD;  Location: [place];  Service: General;  Laterality: N/A;   LEFT HEART CATH AND CORONARY ANGIOGRAPHY N/A 07/17/2021   Procedure: LEFT HEART CATH AND CORONARY ANGIOGRAPHY;  Surgeon: Troy Sine, MD;  Location: [place] CV LAB;  Service: Cardiovascular;  Laterality: N/A;   LOWER EXTREMITY ANGIOGRAM Bilateral 10/28/2011   Procedure: LOWER EXTREMITY ANGIOGRAM;  Surgeon: Angelia Mould, MD;  Location: [facility] CATH LAB;  Service: Cardiovascular;  Laterality: Bilateral;   LOWER EXTREMITY ANGIOGRAPHY  05/27/2022   Procedure: Lower Extremity Angiography;  Surgeon: Waynetta Sandy, MD;  Location: [place] CV LAB;  Service: Cardiovascular;;   PACEMAKER INSERTION  07-16-12   pacemaker/defibrilator   POLYPECTOMY     POSTERIOR CERVICAL FUSION/FORAMINOTOMY N/A 06/09/2014   Procedure: Laminectomy - Cervical two-Cervcial four posterior cervical instrumented fusion Cervical two-cervical four;  Surgeon: Eustace Moore, MD;  Location: [place] NEURO ORS;  Service: Neurosurgery;  Laterality: N/A;  posterior    SAVORY DILATION N/A 02/05/2018   Procedure: SAVORY DILATION;  Surgeon: Mauri Pole, MD;  Location: [facility] ENDOSCOPY;  Service: Endoscopy;  Laterality: N/A;   TONSILLECTOMY     as a child    UMBILICAL HERNIA REPAIR N/A 05/14/2013   Procedure: LAPAROSCOPIC exploration and repair of hernia in abdominal ;  Surgeon: Adin Hector, MD;  Location: [place];  Service: General;  Laterality: N/A;   UPPER GASTROINTESTINAL ENDOSCOPY  2020    Allergies  Allergen Reactions   Aldactone [Spironolactone] Other (See Comments)    Hyperkalemia   Codeine Rash and Hives   Atorvastatin     Myalgias with lipitor.  Does tolerate simvastatin.     Januvia [Sitagliptin] Other (See Comments)    Diarrhea and heart racing   Jardiance [Empagliflozin] Other (See Comments)    Polyuria; excessive weight loss    Lisinopril     Possible cause of pancreatitis    RT foot 07/05/2022   Objective/Physical Exam General: The patient is alert and oriented x3 in no acute distress.  Dermatology:  Mostly unchanged since last visit.  Wound #1 noted to the plantar aspect of the first MTP right foot measuring approximately 1.0 x 1.0 x 0.3 cm (LxWxD).  The ulcer directly underlies the sesamoidal apparatus of the right foot  To the noted ulceration(s), there is no eschar. There is a moderate amount of slough, fibrin, and necrotic tissue noted. Granulation tissue and wound base is red. There is a minimal amount of serosanguineous drainage noted. There is no exposed bone muscle-tendon ligament or joint. There is no malodor. Periwound integrity is intact. Skin is warm, dry and supple bilateral lower extremities.  Vascular: Healthy adequate bleeding upon debridement of the ulcer.  Lower extremity angiography 05/27/2022 performed by Dr. Servando Snare, vein and vascular.  Please see report  Neurological: Light touch  and protective threshold diminished bilaterally.   Musculoskeletal Exam: No pedal deformity.  No prior amputations.  Muscle strength 5/5 all compartments  Radiographic exam RT foot: Skin marker placed directly underlying the ulcer which correlates with the fibular sesamoid.  No acute fractures identified.  Radiographic evidence for osteomyelitis.  Assessment: 1.  Ulcer plantar aspect of the first MTP right secondary to diabetes mellitus 2. diabetes mellitus w/ peripheral neuropathy    Plan of Care:  1. Patient was evaluated. 2. medically necessary excisional debridement including subcutaneous tissue was performed using a tissue nipper and a chisel blade. Excisional debridement of all the necrotic nonviable tissue down to healthy bleeding viable tissue was performed with post-debridement measurements same as pre-. 3. the wound was cleansed and dry sterile dressing applied. 4.  Continue applying bacitracin  to the area.  Continue with a light dressing daily 5.  Continue offloading felt dancers pads that were applied to the insoles in the shoes to offload pressure from the foot and wound 6.  Last visit authorization for surgery was initiated which would consist of fibular sesamoidectomy of the right foot with debridement of ulcer.  Prior to this he does need vascular clearance.  He has a vascular appointment and possible bypass surgery next month. 7.  Return to clinic 1 month   Luke [name], DPM [facility]  Dr. Edrick [name], DPM    [address], [place] [address]                Office [phone]  Fax [phone]

## 2022-07-16 DIAGNOSIS — E113513 Type 2 diabetes mellitus with proliferative diabetic retinopathy with macular edema, bilateral: Secondary | ICD-10-CM | POA: Diagnosis not present

## 2022-07-16 DIAGNOSIS — H43811 Vitreous degeneration, right eye: Secondary | ICD-10-CM | POA: Diagnosis not present

## 2022-07-16 DIAGNOSIS — H35373 Puckering of macula, bilateral: Secondary | ICD-10-CM | POA: Diagnosis not present

## 2022-07-17 NOTE — Progress Notes (Signed)
Luke [name]    ET:[id number]    01-Oct-1945  Primary Care Physician:Duncan, Elveria Rising, MD  Referring Physician: Tonia Ghent, MD [address] [place],  [place] [address]   Chief complaint:  Chief Complaint  Patient presents with   Gastroesophageal Reflux    Medication refills for omeprazole.   Dysphagia    No complaints at this time.    Colon Polyps    Last colonoscopy in 2021.    HPI: 77 year old male with history of GERD, esophageal stricture here for follow-up visit  I last saw him in GI office on 01/14/19. At that time, he had 2 episodes of dysphagia once with chicken and once with potatoes. He was able to resolve both incidents with sips of water. He had alternating constipation and diarrhea every 3 to 4 days.  Dysphagia has improved s/p EGD with esophageal dilation  Today, he reports feeling well and is taking Omeprazole 40 mg once a day. He reports typically having normal BM almost daily. He reports experiencing some occasional gas. He denies any reflux or dysphagia.   He denies diarrhea, constipation, nausea, blood in stool, black stool, vomiting, abdominal pain, bloating, unintentional weight loss, reflux, dysphagia.  He reports being diagnosed with IPF since his last visit.   H/o AAA s/p EVAR and PAD He underwent L heart cath 07/16/21 showed two-vessel CAD with 80% focal proximal LAD stenosis, diffusely calcified RCA subtotal/total occlusion distally with excellent collateralization to the PDA and PLA   GI Hx:  Colonoscopy 10/13/19  - Three 1 to 2 mm polyps in the rectum, in the descending colon and in the cecum, SKIP and removed with a cold biopsy forceps. Resected and retrieved.  - Two 6 to 8 mm polyps in the transverse colon, removed with a cold snare. Resected and retrieved.retrieved. - Moderate diverticulosis in the sigmoid colon, in the descending colon and in the ascending colon. - Non-bleeding internal hemorrhoids.  - The examination  was otherwise normal 1. Surgical [P], colon, transverse x 2, cecal x 1, polyp (3) - TUBULAR ADENOMA(S). - NO HIGH GRADE DYSPLASIA OR CARCINOMA. 2. Surgical [P], colon, descending, rectal, polyp (2) - TUBULAR ADENOMA. - NO HIGH GRADE DYSPLASIA OR CARCINOMA. - HYPERPLASTIC POLYP(S).  EGD November 02, 2018: Proximal esophageal stricture dilated with TTS balloon from 15 to 18 mm, subsequently passed 48 French Maloney dilator with no resistance  EGD 09/27/18 by Dr Benson Norway with disimpaction of food bolus from distal esophagus, benign esophageal stricture approximately 1.2 cm in distal esophagus.   EGD 06/17/18 distal esophageal stenosis approximately 1.3 cm, dilated with TTS balloon to 15.5 mm   EGD 03/23/2018 with proximal esophageal stricture dilated to 13 mm with TTS balloon.  Esophageal biopsies negative for eosinophilic esophagitis.  Colonoscopy 03/23/2018 with removal of 16 sessile adenomatous polyps.   He was hospitalized in July 2019 with acute pancreatitis, idiopathic.  Possible secondary to medication, ?  Ace inhibitor. Repeat CT abdomen pelvis November 2019 negative for any mass lesion or IPMN.  Showed small volume peripancreatic fluid with resolution of acute pancreatitis.   Current Outpatient Medications:    albuterol (VENTOLIN HFA) 108 (90 Base) MCG/ACT inhaler, Inhale 2 puffs into the lungs every 6 (six) hours as needed for wheezing or shortness of breath., Disp: 8 g, Rfl: 6   amLODipine (NORVASC) 10 MG tablet, TAKE 1 TABLET(10 MG) BY MOUTH DAILY, Disp: 90 tablet, Rfl: 3   aspirin EC 81 MG  tablet, Take 81 mg by mouth every evening., Disp: , Rfl:    carvedilol (COREG) 25 MG tablet, TAKE 1 TABLET BY MOUTH TWICE DAILY WITH MEALS, Disp: 180 tablet, Rfl: 3   Cholecalciferol (VITAMIN D3) 10 MCG (400 UNIT) CAPS, Take 1 capsule by mouth daily., Disp: , Rfl:    ESBRIET 267 MG TABS, Take 2 tablets (534 mg total) by mouth in the morning, at noon, and at bedtime. **NOTE LOW DOSE AS MAINTENANCE**,  Disp: 540 tablet, Rfl: 1   glipiZIDE (GLUCOTROL) 5 MG tablet, TAKE 1 TABLET(5 MG) BY MOUTH TWICE DAILY BEFORE A MEAL, Disp: 180 tablet, Rfl: 1   glucose blood (ACCU-CHEK AVIVA PLUS) test strip, AS DIRECTED TO CHECK BLOOD SUGAR TWICE DAILY, Disp: 200 strip, Rfl: 3   insulin glargine (LANTUS SOLOSTAR) 100 UNIT/ML Solostar Pen, INJECT 0.3 TO 0.35 MLS(30 TO 35 UNITS) INTO THE SKIN EVERY DAY (Patient taking differently: Inject 30 Units into the skin at bedtime.), Disp: 15 mL, Rfl: 3   isosorbide mononitrate (IMDUR) 30 MG 24 hr tablet, Take 1 tablet (30 mg total) by mouth daily., Disp: 180 tablet, Rfl: 3   metFORMIN (GLUCOPHAGE) 500 MG tablet, TAKE 2 TABLETS BY MOUTH TWICE DAILY WITH FOOD, Disp: 360 tablet, Rfl: 3   Multiple Vitamin (MULTIVITAMIN WITH MINERALS) TABS tablet, Take 1 tablet by mouth every evening., Disp: , Rfl:    omeprazole (PRILOSEC) 40 MG capsule, TAKE 1 CAPSULE(40 MG) BY MOUTH DAILY, Disp: 90 capsule, Rfl: 0   simvastatin (ZOCOR) 40 MG tablet, TAKE 1 TABLET BY MOUTH AT BEDTIME, Disp: 90 tablet, Rfl: 3   umeclidinium bromide (INCRUSE ELLIPTA) 62.5 MCG/ACT AEPB, INHALE 1 PUFF INTO THE LUNGS DAILY, Disp: 30 each, Rfl: 0   valsartan (DIOVAN) 40 MG tablet, Take 20 mg by mouth daily., Disp: , Rfl:    vitamin B-12 (CYANOCOBALAMIN) 1000 MCG tablet, Take 1,000 mcg by mouth in the morning., Disp: , Rfl:     Allergies as of 07/19/2022 - Review Complete 07/19/2022  Allergen Reaction Noted   Aldactone [spironolactone] Other (See Comments) 04/20/2013   Codeine Rash and Hives    Atorvastatin  03/19/2021   Januvia [sitagliptin] Other (See Comments) 11/21/2016   Jardiance [empagliflozin] Other (See Comments) 04/13/2021   Lisinopril  11/18/2017    Past Medical History:  Diagnosis Date   AAA (abdominal aortic aneurysm) ([place]) 2011   Per vascular surgery   Anxiety    no med. in use for anxiety but pt. speaks openly of his stress & anxious feelings regarding impending surgery     Atrial  fibrillation ([place])    Automatic implantable cardioverter-defibrillator in situ    CAD (coronary artery disease)    Presumed CAD with nuclear scan October 09, 2011,  large anteroseptal MI and inferior MI. Catheterization scheduled October 15, 2011   Cardiomyopathy [facility])    Nuclear, October 09, 2011, EF 30%, multiple focal wall motion abnormalities   Diabetes mellitus    type II   Drug therapy    Hyperkalemia with spironolactone   Ejection fraction < 50%    EF 30%, nuclear, October 09, 2011   Ejection fraction < 50%    EF previously 30%  //   EF 30-35%, echo, July 14, 2012, severe diffuse hypokinesis, PA pressure 43 mm mercury   Fall due to ice or snow Feb.  19, 2015   HLD (hyperlipidemia)    Hypertension    white coat HTN-- often elevated in office and controlled on outside checks.   ICD (  implantable cardioverter-defibrillator) in place    CRT-D placed March, 2014 complete heart block and k dysfunction   LBBB (left bundle branch block)    LBBB on EKG October 11, 2011,  no prior EKG has been done   Low testosterone    Hx of   Myocardial infarction ([place])    Told in 2015-never knew-showed up on stress test for AAA;    Pacemaker    PAD (peripheral artery disease) ([place])    Pancreatitis     Past Surgical History:  Procedure Laterality Date   ABDOMINAL AORTAGRAM N/A 10/28/2011   Procedure: ABDOMINAL [name];  Surgeon: Angelia Mould, MD;  Location: [facility] CATH LAB;  Service: Cardiovascular;  Laterality: N/A;   ABDOMINAL AORTIC ANEURYSM REPAIR     EVAR    ABDOMINAL AORTOGRAM N/A 05/27/2022   Procedure: ABDOMINAL AORTOGRAM;  Surgeon: Waynetta Sandy, MD;  Location: [place] CV LAB;  Service: Cardiovascular;  Laterality: N/A;   BI-VENTRICULAR IMPLANTABLE CARDIOVERTER DEFIBRILLATOR N/A 07/16/2012   Procedure: BI-VENTRICULAR IMPLANTABLE CARDIOVERTER DEFIBRILLATOR  (CRT-D);  Surgeon: Deboraha Sprang, MD;  Location: [facility] CATH LAB;  Service: Cardiovascular;  Laterality: N/A;   BIV ICD GENERATOR  CHANGEOUT N/A 10/25/2020   Procedure: BIV ICD GENERATOR CHANGEOUT;  Surgeon: Deboraha Sprang, MD;  Location: [place] CV LAB;  Service: Cardiovascular;  Laterality: N/A;   CARDIAC CATHETERIZATION     COLONOSCOPY  03/23/2018; 2020   ESOPHAGOGASTRODUODENOSCOPY N/A 02/12/2019   Procedure: ESOPHAGOGASTRODUODENOSCOPY (EGD);  Surgeon: Jackquline Denmark, MD;  Location: [facility] ENDOSCOPY;  Service: Endoscopy;  Laterality: N/A;   ESOPHAGOGASTRODUODENOSCOPY (EGD) WITH PROPOFOL N/A 02/05/2018   Procedure: ESOPHAGOGASTRODUODENOSCOPY (EGD) WITH PROPOFOL;  Surgeon: Mauri Pole, MD;  Location: [facility] ENDOSCOPY;  Service: Endoscopy;  Laterality: N/A;   ESOPHAGOGASTRODUODENOSCOPY (EGD) WITH PROPOFOL N/A 09/27/2018   Procedure: ESOPHAGOGASTRODUODENOSCOPY (EGD) WITH PROPOFOL;  Surgeon: Carol Ada, MD;  Location: [place];  Service: Endoscopy;  Laterality: N/A;   FOREIGN BODY REMOVAL  09/27/2018   Procedure: FOREIGN BODY REMOVAL;  Surgeon: Carol Ada, MD;  Location: [facility] ENDOSCOPY;  Service: Endoscopy;;   FOREIGN BODY REMOVAL  02/12/2019   Procedure: FOREIGN BODY REMOVAL;  Surgeon: Jackquline Denmark, MD;  Location: [facility] ENDOSCOPY;  Service: Endoscopy;;   HERNIA REPAIR  Jan. 9, 2015   INSERTION OF MESH N/A 05/14/2013   Procedure: INSERTION OF MESH;  Surgeon: Adin Hector, MD;  Location: [place];  Service: General;  Laterality: N/A;   LEFT HEART CATH AND CORONARY ANGIOGRAPHY N/A 07/17/2021   Procedure: LEFT HEART CATH AND CORONARY ANGIOGRAPHY;  Surgeon: Troy Sine, MD;  Location: [place] CV LAB;  Service: Cardiovascular;  Laterality: N/A;   LOWER EXTREMITY ANGIOGRAM Bilateral 10/28/2011   Procedure: LOWER EXTREMITY ANGIOGRAM;  Surgeon: Angelia Mould, MD;  Location: [facility] CATH LAB;  Service: Cardiovascular;  Laterality: Bilateral;   LOWER EXTREMITY ANGIOGRAPHY  05/27/2022   Procedure: Lower Extremity Angiography;  Surgeon: Waynetta Sandy, MD;  Location: [place] CV LAB;  Service: Cardiovascular;;    PACEMAKER INSERTION  07-16-12   pacemaker/defibrilator   POLYPECTOMY     POSTERIOR CERVICAL FUSION/FORAMINOTOMY N/A 06/09/2014   Procedure: Laminectomy - Cervical two-Cervcial four posterior cervical instrumented fusion Cervical two-cervical four;  Surgeon: Eustace Moore, MD;  Location: [place] NEURO ORS;  Service: Neurosurgery;  Laterality: N/A;  posterior    SAVORY DILATION N/A 02/05/2018   Procedure: SAVORY DILATION;  Surgeon: Mauri Pole, MD;  Location: [facility] ENDOSCOPY;  Service: Endoscopy;  Laterality: N/A;   TONSILLECTOMY  as a child    UMBILICAL HERNIA REPAIR N/A 05/14/2013   Procedure: LAPAROSCOPIC exploration and repair of hernia in abdominal ;  Surgeon: Adin Hector, MD;  Location: [place];  Service: General;  Laterality: N/A;   UPPER GASTROINTESTINAL ENDOSCOPY  2020    Family History  Problem Relation Age of Onset   Hypertension Mother    Stroke Mother    Hyperlipidemia Mother    Lung cancer Father    Diabetes Sister    Heart disease Sister        Before age 88   Hypertension Sister    Hyperlipidemia Sister    Heart attack Sister    Hypertension Son    Colon cancer Neg Hx    Prostate cancer Neg Hx    Esophageal cancer Neg Hx    Stomach cancer Neg Hx    Rectal cancer Neg Hx    Colon polyps Neg Hx    Pancreatic cancer Neg Hx     Social History   Socioeconomic History   Marital status: Married    Spouse name: Patti   Number of children: 1   Years of education: Not on file   Highest education level: Not on file  Occupational History   Occupation: retired  Tobacco Use   Smoking status: Former    Packs/day: 2.00    Years: 40.00    Additional pack years: 0.00    Total pack years: 80.00    Types: Cigarettes    Start date: 1964    Quit date: 05/06/2004    Years since quitting: 18.2    Passive exposure: Never   Smokeless tobacco: Never  Vaping Use   Vaping Use: Never used  Substance and Sexual Activity   Alcohol use: Not Currently   Drug use: No   Sexual  activity: Yes    Partners: Female  Other Topics Concern   Not on file  Social History Narrative   [place] vet, he has known service related agent orange exposure.    Retired   Chiropractor daily.     Social Determinants of Health   Financial Resource Strain: Low Risk  (04/22/2022)   Overall Financial Resource Strain (CARDIA)    Difficulty of Paying Living Expenses: Not hard at all  Food Insecurity: No Food Insecurity (04/22/2022)   Hunger Vital Sign    Worried About Running Out of Food in the Last Year: Never true    Ran Out of Food in the Last Year: Never true  Transportation Needs: No Transportation Needs (04/22/2022)   PRAPARE - Hydrologist (Medical): No    Lack of Transportation (Non-Medical): No  Physical Activity: Sufficiently Active (04/22/2022)   Exercise Vital Sign    Days of Exercise per Week: 6 days    Minutes of Exercise per Session: 60 min  Stress: No Stress Concern Present (04/22/2022)   [place]    Feeling of Stress : Not at all  Social Connections: Moderately Isolated (04/22/2022)   Social Connection and Isolation Panel [NHANES]    Frequency of Communication with Friends and Family: More than three times a week    Frequency of Social Gatherings with Friends and Family: Twice a week    Attends Religious Services: Never    Marine scientist or Organizations: No    Attends Archivist Meetings: Never    Marital Status: Married  Human resources officer Violence: Not At Risk (04/22/2022)  Humiliation, Afraid, Rape, and Kick questionnaire    Fear of Current or Ex-Partner: No    Emotionally Abused: No    Physically Abused: No    Sexually Abused: No      Review of systems: Review of Systems  Constitutional:  Negative for unexpected weight change.  HENT:  Negative for trouble swallowing.   Gastrointestinal:  Negative for abdominal distention, abdominal pain,  blood in stool, constipation, diarrhea and nausea.      Physical Exam: General: well-appearing  Eyes: sclera anicteric, no redness Skin; warm and dry, no rash or jaundice noted Neuro: awake, alert and oriented x 3. Normal gross motor function and fluent speech   Data Reviewed:  Reviewed labs, radiology imaging, old records and pertinent past GI work up CT abdomen and pelvis March 25, 2018 1. Since 11/10/2017, improved appearance of the pancreas. Development of atrophy with resolution of peripancreatic edema. There is minimal ill-defined fluid surrounding the neck and body. 2. Aortic atherosclerosis (ICD10-I70.0) and emphysema (ICD10-J43.9). 3. Status post aortic stent graft repair, similar.  Assessment and Plan/Recommendations:  77 year old male with type 2 diabetes, pancreatitis, CAD, hypertension, chronic GERD with esophageal stricture and multiple adenomatous polyps (16) removed on last colonoscopy November 2019 and June 2021 Recently diagnosed with IPF  Patient wants to hold off scheduling surveillance colonoscopy given his age, recent diagnosis of IPF and his cardiac history  Esophageal stricture improved s/p EGD with dilation Esophageal biopsies negative for eosinophilic esophagitis  GERD: Continue omeprazole 40 mg daily in the morning and add Pepcid 20 mg at bedtime Continue lifestyle modifications and antireflux measures  Pancreatitis, no further episodes.  Follow-up CT showed resolution of pancreatitis and negative for mass lesions in pancreas  Return in 1 year or sooner if needed  [place] as a scribe for Harl Bowie, MD.,have documented all relevant documentation on the behalf of Harl Bowie, MD,as directed by  Harl Bowie, MD while in the presence of Harl Bowie, MD.   I, Harl Bowie, MD, have reviewed all documentation for this visit. The documentation on 07/19/22 for the exam, diagnosis, procedures, and orders are all  accurate and complete.    Damaris Hippo , MD    CC: Tonia Ghent, MD

## 2022-07-19 ENCOUNTER — Ambulatory Visit (INDEPENDENT_AMBULATORY_CARE_PROVIDER_SITE_OTHER): Payer: Medicare Other | Admitting: Gastroenterology

## 2022-07-19 ENCOUNTER — Encounter: Payer: Self-pay | Admitting: Gastroenterology

## 2022-07-19 ENCOUNTER — Encounter: Payer: Medicare Other | Admitting: Podiatry

## 2022-07-19 VITALS — BP 142/64 | HR 70 | Ht 74.0 in | Wt 188.2 lb

## 2022-07-19 DIAGNOSIS — K219 Gastro-esophageal reflux disease without esophagitis: Secondary | ICD-10-CM | POA: Diagnosis not present

## 2022-07-19 DIAGNOSIS — Z8719 Personal history of other diseases of the digestive system: Secondary | ICD-10-CM | POA: Diagnosis not present

## 2022-07-19 DIAGNOSIS — J84112 Idiopathic pulmonary fibrosis: Secondary | ICD-10-CM | POA: Diagnosis not present

## 2022-07-19 DIAGNOSIS — Z86010 Personal history of colonic polyps: Secondary | ICD-10-CM | POA: Diagnosis not present

## 2022-07-19 DIAGNOSIS — Z860101 Personal history of adenomatous and serrated colon polyps: Secondary | ICD-10-CM

## 2022-07-19 MED ORDER — FAMOTIDINE 20 MG PO TABS: 20.0000 mg | ORAL_TABLET | Freq: Every day | ORAL | 3 refills | Status: DC

## 2022-07-19 MED ORDER — OMEPRAZOLE 40 MG PO CPDR: DELAYED_RELEASE_CAPSULE | ORAL | 3 refills | Status: DC

## 2022-07-19 NOTE — Patient Instructions (Signed)
We have sent the following medications to your pharmacy for you to pick up at your convenience: Omeprazole  Pepcid  I appreciate the  opportunity to care for you  Thank You   Harl Bowie , MD

## 2022-07-24 ENCOUNTER — Ambulatory Visit (INDEPENDENT_AMBULATORY_CARE_PROVIDER_SITE_OTHER): Payer: Medicare Other

## 2022-07-24 DIAGNOSIS — I442 Atrioventricular block, complete: Secondary | ICD-10-CM | POA: Diagnosis not present

## 2022-07-25 LAB — CUP PACEART REMOTE DEVICE CHECK
Battery Remaining Longevity: 76 mo
Battery Remaining Percentage: 79 %
Battery Voltage: 2.98 V
Brady Statistic AP VP Percent: 24 %
Brady Statistic AP VS Percent: 1 %
Brady Statistic AS VP Percent: 75 %
Brady Statistic AS VS Percent: 1 %
Brady Statistic RA Percent Paced: 23 %
Date Time Interrogation Session: 20240320032029
HighPow Impedance: 79 Ohm
Implantable Lead Connection Status: 753985
Implantable Lead Connection Status: 753985
Implantable Lead Connection Status: 753985
Implantable Lead Implant Date: 20140313
Implantable Lead Implant Date: 20140313
Implantable Lead Implant Date: 20140313
Implantable Lead Location: 753858
Implantable Lead Location: 753859
Implantable Lead Location: 753860
Implantable Pulse Generator Implant Date: 20220622
Lead Channel Impedance Value: 380 Ohm
Lead Channel Impedance Value: 430 Ohm
Lead Channel Impedance Value: 790 Ohm
Lead Channel Pacing Threshold Amplitude: 0.5 V
Lead Channel Pacing Threshold Amplitude: 0.75 V
Lead Channel Pacing Threshold Amplitude: 1.625 V
Lead Channel Pacing Threshold Pulse Width: 0.5 ms
Lead Channel Pacing Threshold Pulse Width: 0.5 ms
Lead Channel Pacing Threshold Pulse Width: 0.6 ms
Lead Channel Sensing Intrinsic Amplitude: 11.8 mV
Lead Channel Sensing Intrinsic Amplitude: 3.6 mV
Lead Channel Setting Pacing Amplitude: 1.75 V
Lead Channel Setting Pacing Amplitude: 2 V
Lead Channel Setting Pacing Amplitude: 2.125
Lead Channel Setting Pacing Pulse Width: 0.5 ms
Lead Channel Setting Pacing Pulse Width: 0.6 ms
Lead Channel Setting Sensing Sensitivity: 0.5 mV
Pulse Gen Serial Number: 810030647

## 2022-07-29 ENCOUNTER — Ambulatory Visit (INDEPENDENT_AMBULATORY_CARE_PROVIDER_SITE_OTHER): Payer: Medicare Other | Admitting: Internal Medicine

## 2022-07-29 ENCOUNTER — Encounter: Payer: Self-pay | Admitting: Internal Medicine

## 2022-07-29 VITALS — BP 120/66 | HR 69 | Temp 97.5°F | Ht 72.0 in | Wt 185.6 lb

## 2022-07-29 DIAGNOSIS — J84112 Idiopathic pulmonary fibrosis: Secondary | ICD-10-CM | POA: Diagnosis not present

## 2022-07-29 DIAGNOSIS — Z5181 Encounter for therapeutic drug level monitoring: Secondary | ICD-10-CM

## 2022-07-29 DIAGNOSIS — Z7185 Encounter for immunization safety counseling: Secondary | ICD-10-CM

## 2022-07-29 DIAGNOSIS — J432 Centrilobular emphysema: Secondary | ICD-10-CM

## 2022-07-29 LAB — PULMONARY FUNCTION TEST
DL/VA % pred: 56 %
DL/VA: 2.23 ml/min/mmHg/L
DLCO cor % pred: 43 %
DLCO cor: 11.69 ml/min/mmHg
DLCO unc % pred: 43 %
DLCO unc: 11.69 ml/min/mmHg
FEF 25-75 Pre: 1.38 L/sec
FEF2575-%Pred-Pre: 57 %
FEV1-%Pred-Pre: 77 %
FEV1-Pre: 2.56 L
FEV1FVC-%Pred-Pre: 92 %
FEV6-%Pred-Pre: 87 %
FEV6-Pre: 3.76 L
FEV6FVC-%Pred-Pre: 104 %
FVC-%Pred-Pre: 83 %
FVC-Pre: 3.83 L
Pre FEV1/FVC ratio: 67 %
Pre FEV6/FVC Ratio: 98 %

## 2022-07-29 NOTE — Patient Instructions (Addendum)
ICD-10-CM   1. IPF (idiopathic pulmonary fibrosis) (HCC)  J84.112 Hepatic function panel    Hepatic function panel    2. Centrilobular emphysema ([place])  J43.2     3. 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[place])  J43.9    J84.10       Re IPF  - sstable on symptoms , CT, PFT  and walk as of 07/29/2022 - noted wanted to hold off clinical trials right now - best wisehs for leg surgery  Plan - recommend RSV vaccine - cotninue esbriet  at 2 pill thress times daily; take with food - check LFT in 3 months - clinical trial consideration in future - consider TETON study with inh - do spirom/dlco  4 months    Emphysema (minor component)   Stable  Plan -continue spiriva respimat 2 puff daily  - continue  albuterol prn  Lung nodules right - cluster - new Aug 2023 - resolved NOv 2023  Plan  -reassure   Followup -  -- do LFT in 12 week - spriometyr and dlco in 16 weeks - [place] Audia 30 min visit after spiro/dlco in 16 weeks but after PFT   - symptom score and walk test at followup

## 2022-07-29 NOTE — Patient Instructions (Signed)
Spirometry and DLCO Performed Today.  

## 2022-07-29 NOTE — Progress Notes (Signed)
Spirometry and DLCO Performed Today.  

## 2022-07-29 NOTE — Progress Notes (Signed)
OV 01/02/2021  Subjective:  Patient ID: Luke [name], male , DOB: [birth year] , age 77 y.o. , MRN: [id number] , ADDRESS: [place] [id number] PCP Luke Ghent, MD Patient Care Team: Luke Ghent, MD as PCP - General Luke Mould, MD (Vascular Surgery) Luke Silence, MD (Gastroenterology) Luke Boston, MD (General Surgery) Luke Moore, MD (Neurosurgery) Luke [name] (Optometry) Luke Pain, MD (Cardiology) Luke [name], [facility] as Pharmacist (Pharmacist)  This Provider for this visit: Treatment Team:  Attending Provider: Brand Males, MD    01/02/2021 -transfer of care to the interstitial lung disease center for Dr. Chase [name].  Referral from Dr. June [name] Chief Complaint  Patient presents with   Consult    ILD consult per Dr. Valeta [name]. Pt states he has been doing okay since last visit and denies any complaints.     HPI Luke [name] 77 y.o. -referred by Dr. June [name] for interstitial lung disease evaluation    Integrated Comprehensive ILD Questionnaire  Symptoms:  -Reports insidious onset of shortness of breath gradually.  It is the same since it started.  He is unclear how long he is headed but he had it for a few years.  There is no cough at all.  There is no clearing of the throat.  There is no fatigue.  His appetite is good no nausea no vomiting no diarrhea no anxiety no depression no chronic [name]    Past Medical History :   In 2009 he was diagnosed with systolic heart failure.  He believes etiology is ischemic based on a remote heart attack.  Has a history of diabetes for several years.  Has kidney disease not otherwise specified for several years.  His GFR in May 2022 was greater than 60 with a creatinine 1.23 mg percent.   His last echocardiogram was in 2019.  He sees cardiology Dr. Caryl [name] and Dr. Candee [name].  Overall he is deemed to be stable.  He had pacemaker check recently.  Denies any  collagen vascular disease or vasculitis sleep apnea.  Denies any PE.  He has had COVID-vaccine but has not had COVID. ROS:   -Positive for fatigue.  Has some dysphagia for the last few days but otherwise okay.  No nausea no vomiting no heartburn no snoring no rash no ulcers.   FAMILY HISTORY of LUNG DISEASE: Denies   EXPOSURE HISTORY: Smokes cigarettes 20 1966 in 2012 30 cigarettes/day.  He did have some passive smoking.  Did smoke marijuana between 1968 and 1970.  Once a month.  No cocaine use no intravenous drug use   HOME and HOBBY DETAILS : Single-family [place] the last 25 years the age of the home is 22 years.  There is no dampness.  No mildew no mold.  No humidifier use no CPAP use no nebulizer use.  No steam iron use no Jacuzzi use.  No misting [place] no pet birds or parakeets no pet gerbils.  No mold in the [facility] duct.  Last checked in 2009.  Does do gardening.  No birds at this no strong mats no water damage no Jacuzzi.   OCCUPATIONAL HISTORY (122 questions) : He was in [place] and got exposed to agent orange but otherwise detailed questioning is negative other than the fact he worked with some petroleum based cleaning agents.   PULMONARY TOXICITY HISTORY (27 items): He was on hydralazine in 2019.   HRCT May 2022 -  personally visualzied -definite progression is combined emphysema with UIP features.  IMPRESSION: 1. Spectrum of findings compatible with basilar predominant fibrotic interstitial lung disease without frank honeycombing, with mild progression at the lung bases since 03/25/2018 CT abdomen study, with more clear progression since 10/07/2011 CT abdomen study. Findings are categorized as probable UIP per consensus guidelines: Diagnosis of Idiopathic Pulmonary Fibrosis: An Official ATS/ERS/JRS/ALAT Clinical Practice Guideline. [place], Iss 5, [id number], Jan 04 2017. 2. Scattered small solid pulmonary nodules, largest 5 mm.  Follow-up noncontrast chest CT recommended in 12 months. This recommendation follows the consensus statement: Guidelines for Management of Incidental Pulmonary Nodules Detected on CT Images: From the Fleischner Society 2017; Radiology 2017; 284:228-243. 3. Three-vessel coronary atherosclerosis. 4. Chronic atrophy of pancreatic body and tail, progressive since 2019 CT abdomen study, without discrete mass on this noncontrast CT, indeterminate but presumably due to progressive chronic pancreatitis. 5. Aortic Atherosclerosis (ICD10-I70.0) and Emphysema (ICD10-J43.9).     Electronically Signed   By: Luke [name] M.D.   On: 10/03/2020 16:53  SErology  Results for Luke, [name]" (MRN [id number]) as of 01/02/2021 14:44  Ref. Range 11/13/2020 16:09  Anti Nuclear Antibody (ANA) Latest Ref Range: NEGATIVE  NEGATIVE  Angiotensin-Converting Enzyme Latest Ref Range: 9 - 67 U/L 27  Cyclic Citrullin Peptide Ab Latest Units: UNITS <16  ds DNA Ab Latest Units: IU/mL <1  Myeloperoxidase Abs Latest Units: AI <1.0  Serine Protease 3 Latest Units: AI <1.0  RA Latex Turbid. Latest Ref Range: <14 IU/mL <14  SSA (Ro) (ENA) Antibody, IgG Latest Ref Range: <1.0 NEG AI <1.0 NEG  SSB (La) (ENA) Antibody, IgG Latest Ref Range: <1.0 NEG AI <1.0 NEG  Scleroderma (Scl-70) (ENA) Antibody, IgG Latest Ref Range: <1.0 NEG AI <1.0 NEG    eCHO arch 2019  Study Conclusions   - Left ventricle: The cavity size was normal. Wall thickness was    normal. Systolic function was normal. The estimated ejection    fraction was in the range of 55% to 60%. Wall motion was normal;    there were no regional wall motion abnormalities. Doppler    parameters are consistent with abnormal left ventricular    relaxation (grade 1 diastolic dysfunction).  - Mitral valve: There was mild regurgitation.  - Pulmonary arteries: Systolic pressure was moderately increased.    PA peak pressure: 40 mm Hg (S).     OV 02/14/2021 -  telephine visit  Subjective:  Patient ID: Luke [name], male , DOB: 1945/06/09 , age 77 y.o. , MRN: [id number] , ADDRESS: [place] [phone] PCP Luke Ghent, MD Patient Care Team: Luke Ghent, MD as PCP - General Luke Mould, MD (Vascular Surgery) Luke Silence, MD (Gastroenterology) Luke Boston, MD (General Surgery) Luke Moore, MD (Neurosurgery) Luke [name] [facility]) Luke Pain, MD (Cardiology) Luke [name], [facility] as Pharmacist (Pharmacist)   Type of visit: Telephone/Video Circumstance: COVID-19 national emergency Identification of patient Luke [name] with 1945-08-02 and MRN [id number] - 2 person identifier Risks: Risks, benefits, limitations of telephone visit explained. Patient understood and verbalized agreement to proceed Anyone else on call: just patient Patient location: [phone] -  This provider location: [place], [employer]  This Provider for this visit: Treatment Team:  Attending Provider: Brand Males, MD    02/14/2021 -  FU IPF with some emphysema. Has sCHF  HPI Luke [name] 77 y.o. -telephone visit to  see how his esbriet started is going butt turns out he says he never got referred to pharmacist. Pharmacist confirmed that she is yet to meet iwht him. No esbriet started. Says even spirivan not started. Also, supposed to be telephone visit but got confusing call and he showed up at front desk only to be sent home. Apologized for gap in customer service. Overall well. He said after last vsit he read more about esbriet and got apprehensive esp in light of CKD but lab review shows GFR > 60 this year. 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  Instructions   - Slowly increase the dose per protocol  -Always take it with food  -Any nausea you can try ginger capsules  -Give at least between 5 and 6  hours between dosing  -Good idea to participate in [facility] sponsored medication support program - this is optional  -Definitely apply sunscreen when you go out with this medication  - You will need monthly liver tests for 6 months and thereafter every 3 months  - explained in decreaesd renal functio that side ffects could be higher but his GFR is good enough for full dose  - explained benefit of slowing fibrosis progression and that NNT of 1 to 6 is pretty good   We resolved we will jsu start and titrate up to max dose of 2 pills tid which is 2/3rd normal dose   CT Chest data  No results found.  Results for KEMUEL, ARN" (MRN [id number]) as of 02/14/2021 10:42  Ref. Range 10/23/2020 10:57  Creatinine Latest Ref Range: 0.76 - 1.27 mg/dL 1.17  Results for ELBIN, PLUCINSKI" (MRN [id number]) as of 02/14/2021 10:42  Ref. Range 01/02/2021 15:40  AST Latest Ref Range: 0 - 37 U/L 17  ALT Latest Ref Range: 0 - 53 U/L 16      OV 05/10/2021  Subjective:  Patient ID: Luke [name], male , DOB: Dec 08, 1945 , age 13 y.o. , MRN: [id number] , ADDRESS: [place] [id number] PCP Luke Ghent, MD Patient Care Team: Luke Ghent, MD as PCP - General Luke Mould, MD (Vascular Surgery) Luke Silence, MD (Gastroenterology) Luke Boston, MD (General Surgery) Luke Moore, MD (Neurosurgery) Luke [name] (Optometry) Luke Pain, MD (Cardiology) Luke [name], [facility] as Pharmacist (Pharmacist)  This Provider for this visit: Treatment Team:  Attending Provider: Brand Males, MD    05/10/2021 -   Chief Complaint  Patient presents with   Follow-up    PFT performed today.  Pt states he has been doing okay since last visit and denies any complaints.   02/14/2021 -  FU IPF with some emphysema. Has sCHF = - esbriet shipment at his home 03/23/21  - on esbiret 2 pills tid due to CKD hx (ofev 2nd choice due to his heart  issues)     HPI Luke [name] 77 y.o. -presents for follow-up.  Since his last visit he started pirfenidone.  After starting pirfenidone he developed COVID-19.  In the midst of all this he had to hold his COVID.  He also had diarrhea.  It was not fully clear in November 2022 whether the diarrhea was from [place] or from pirfenidone.  Nevertheless sometime around Thanksgiving he restarted pirfenidone.  He slowly escalated himself within a week to 2 pills 3 times daily.  He is by choice  at 2 pills 3 times daily because of a history of chronic kidney disease although most recent creatinine and GFR were greater than 60.  He says currently is doing 2 pills 3 times daily [submaximal but still therapeutic effect] and is tolerating this well without any GI side effects.  He does have chronic dry skin with flaking.  He says it is refractory.  We discussed about trying flaxseed.  He will do that.  We reviewed his first pulmonary function test.  Between the [facility] and DLCO when averaged he has 60% lung capacity.  He is doing Spiriva but is not sure it is helping him.  His most recent liver function test was 1 month ago.  He will have another 1 today.        OV 08/08/2021  Subjective:  Patient ID: Luke [name], male , DOB: Jan 19, 1946 , age 29 y.o. , MRN: [id number] , ADDRESS: [place] [id number] PCP Luke Ghent, MD Patient Care Team: Luke Ghent, MD as PCP - General Luke Mould, MD (Vascular Surgery) Luke Silence, MD (Gastroenterology) Luke Boston, MD (General Surgery) Luke Moore, MD (Neurosurgery) Luke [name] (Optometry) Luke Pain, MD (Cardiology) Luke [name], [facility] as Pharmacist (Pharmacist)  This Provider for this visit: Treatment Team:  Attending Provider: Brand Males, MD    08/08/2021 -   Chief Complaint  Patient presents with   Follow-up    3 mo f/u for IPF. Stopped the Esbriet due to increased dizziness combined with the  isosorbide. Will resume in a few weeks.    02/14/2021 -  FU IPF with some emphysema. Has sCHF = - esbriet shipment at his home 03/23/21  - on esbiret 2 pills tid due to CKD hx (ofev 2nd choice due to his heart issues)  HPI Luke [name] 77 y.o. -returns for follow-up.  At this visit the original plan was to increase his pirfenidone but he tells me that while doing his daily exercises he started noticing chest [name] on the treadmill.  This followed eye surgery in January 2023.  He had cardiac cath mid March 2023 and he had 100% blockage in one of his vessels.  His isosorbide was then increased.  He started feeling dizzy.  He attributed part of the dizziness due to pirfenidone.  He says pirfenidone was already giving him dizziness and this was made worse by the isosorbide.  He then decided to take his isosorbide half pill twice daily which helped his dizziness.  But he still was dizzy so he stopped his pirfenidone and the dizziness improved a lot.  He decided to protect his heart over his lung.  He has upcoming visit with cardiology in Sep 11, 2021.  Till then he wants to be on pirfenidone holiday.  After that he is definitely intending to restart the pirfenidone.  We discussed nintedanib and because of potential cardiac risk he does not want to do it.  We discussed clinical trials he is interested but he wants to rechallenge himself with pirfenidone first.  Currently he is walking 2 miles per day on a treadmill at 4 miles an hour without an incline and it takes 30 minutes.  Current symptom score is below.  Labs look okay .  The chest [name] is improved  Regarding his dry skin: This is better after flaxseed.  He asked for some advice about reversing atherosclerosis.  I referred him to the [employer].  He understands this means strict  vegetarian/vegan diet.  He admits readily that he is a carnivore.  In the last meat-based diet.   OV 10/03/2021  Subjective:  Patient ID: Luke [name], male ,  DOB: [birth year] , age 59 y.o. , MRN: [id number] , ADDRESS: [place] [id number] PCP Luke Ghent, MD Patient Care Team: Luke Ghent, MD as PCP - General Luke Pain, MD as PCP - Cardiology (Cardiology) Luke Mould, MD (Vascular Surgery) Luke Silence, MD (Gastroenterology) Luke Boston, MD (General Surgery) Luke Moore, MD (Neurosurgery) Luke [name] (Optometry) Luke Pain, MD (Cardiology) Luke [name], [facility] as Pharmacist (Pharmacist)  This Provider for this visit: Treatment Team:  Attending Provider: Brand Males, MD  02/14/2021 -  FU IPF with some emphysema. Has sCHF = - esbriet shipment at his home 03/23/21  - on esbiret 2 pills tid due to CKD hx (ofev kept as 2nd choice due to his heart issues)  -  esbriet on hold since mid march 2023 due to dizziness made by worse by nitrate and improved after stopping esbriet  - restart late may 2023  10/03/2021 -   Chief Complaint  Patient presents with   Follow-up    Pt states he has been doing okay since last visit and denies any real complaints.     HPI Luke [name] 77 y.o. -returns for follow-up.  He says he is doing well.  The symptoms are minimal.  He continues to exercise.  He says he has difficulty tolerating medication adds.  He has not been stable on the isosorbide which gave him dizziness in the setting of pirfenidone.  He stopped the pirfenidone although he attributed the dizziness to the isosorbide.  After stopping pirfenidone his dizziness went away.  Now for the last 20 days he is also been on valsartan.  He believes it was a new start.  For the last 1 week he is now back on pirfenidone at 1 pill 3 times daily.  There are no side effects right now.  He is trying to go to 2 pills 3 times daily [the maximum dose and account of his CKD].  He is worried that he can get side effects such as fogginess and headaches.  But he is willing to take it and monitor.  He is  interested in clinical trials but at this point in time his is.  Is not on a stable dose.  His symptom score is stable His pulmonary function test shows stability/improvement in FVC.  Last CT scan of the chest May 2022     OV 01/10/2022  Subjective:  Patient ID: Luke [name], male , DOB: Mar 16, 1946 , age 3 y.o. , MRN: [id number] , ADDRESS: [place] [id number] PCP Luke Ghent, MD Patient Care Team: Luke Ghent, MD as PCP - General Luke Pain, MD as PCP - Cardiology (Cardiology) Luke Mould, MD (Vascular Surgery) Luke Silence, MD (Gastroenterology) Luke Boston, MD (General Surgery) Luke Moore, MD (Neurosurgery) Luke [name] (Optometry) Luke Pain, MD (Cardiology) Charlton Haws, [facility] as Pharmacist (Pharmacist)  This Provider for this visit: Treatment Team:  Attending Provider: Brand Males, MD    01/10/2022 -   Chief Complaint  Patient presents with   Follow-up    PFT performed today.  Pt states he has been dong okay since last visit and denies any complaints.   HPI Luke [name] 77 y.o. -returns for follow-up.  He continues to  do well.  He is back on pirfenidone at 2 pills 3 times daily.  He does not want to escalate this further because of dizziness that he feels is because of valsartan and nitrates.  He has adjusted his valsartan and nitrate regimen but he still has dizziness.  With the pirfenidone he feels goofy.  He says the side effects overlapping but somewhat different.  He is able to differentiate the side effects between the 2 but certainly if he increases his pirfenidone he will get more dizzy.  His shortness of breath is stable his walking desaturation test is stable.  His pulmonary function test is stable.  His CT scan of the chest also shows stability and IPF over 1 year.  Of note he has some new nodules described in the right lower lobe in August 2023 CT chest.  A 94-month follow-up is  recommended.  He had recent liver function test and that is normal.   CT Chest data - HRCT Aug 2023  Narrative & Impression  CLINICAL DATA:  Interstitial lung disease   EXAM: CT CHEST WITHOUT CONTRAST   TECHNIQUE: Multidetector CT imaging of the chest was performed following the standard protocol without intravenous contrast. High resolution imaging of the lungs, as well as inspiratory and expiratory imaging, was performed.   RADIATION DOSE REDUCTION: This exam was performed according to the departmental dose-optimization program which includes automated exposure control, adjustment of the mA and/or kV according to patient size and/or use of iterative reconstruction technique.   COMPARISON:  Chest CT dated Oct 03, 2020   FINDINGS: Cardiovascular: Normal heart size. No pericardial effusion. Normal caliber thoracic aorta with severe calcified plaque. Severe coronary artery calcifications of the LAD. Partially visualized left chest wall pacer wires with leads in the right atrium, right ventricle and lead overlying the left ventricle.   Mediastinum/Nodes: Esophagus and thyroid are unremarkable. No pathologically enlarged lymph nodes seen in the chest.   Lungs/Pleura: Central airways are patent. No significant air trapping. Severe centrilobular emphysema. Subpleural and basilar predominant reticular opacities with associated ground-glass, traction bronchiectasis. Honeycomb change is seen in the medial right lower lobe on series 12 image 199, not significantly changed when compared with prior exam. No definite evidence of progressive fibrosis. New clustered part solid nodules of the right lower lobe, largest nodule measures 17 mm in overall diameter with 6 mm solid component, located on series 12, image 261.   Upper Abdomen: No acute abnormality.   Musculoskeletal: No chest wall mass or suspicious bone lesions identified.   IMPRESSION: 1. Basilar and subpleural  predominant fibrotic interstitial lung disease with traction bronchiectasis and honeycomb change. No evidence of progression. Findings are consistent with UIP per consensus guidelines: Diagnosis of Idiopathic Pulmonary Fibrosis: An Official ATS/ERS/JRS/ALAT Clinical Practice Guideline. [place], Iss 5, [phone], Jan 04 2017. 2. New clustered part solid pulmonary nodules of the right lower lobe, likely due to infection or aspiration. Recommend follow-up chest CT in 3 months to ensure resolution. 3. Aortic Atherosclerosis (ICD10-I70.0) and Emphysema (ICD10-J43.9).     Electronically Signed   By: Yetta Glassman M.D.   On: 12/07/2021 10:54    No results found.   OV 04/04/2022  Subjective:  Patient ID: Luke [name], male , DOB: 18-Oct-1945 , age 69 y.o. , MRN: [id number] , ADDRESS: [place] [id number] PCP Luke Ghent, MD Patient Care Team: Luke Ghent, MD as PCP - General Luke Pain, MD as  PCP - Cardiology (Cardiology) Luke Mould, MD (Vascular Surgery) Luke Silence, MD (Gastroenterology) Luke Boston, MD (General Surgery) Luke Moore, MD (Neurosurgery) Luke [name] (Optometry) Luke Pain, MD (Cardiology) Charlton Haws, [facility] as Pharmacist (Pharmacist)  This Provider for this visit: Treatment Team:  Attending Provider: Brand Males, MD     04/04/2022 -   Chief Complaint  Patient presents with   Follow-up    PFT performed today.  Pt states he has been doing okay since last visit and denies any complaints.     HPI Luke [name] 77 y.o. -returns for follow-up.  At this visit he had pulmonary function test that shows stability in FVC but his DLCO is decreased.  Symptom wise he feels stable.  He had a high-resolution CT chest that showed stability in the ILD for over a time.  He believes the DLCO decline is because of his heart failure because he feels good.  He is able to mow  his lawn and raking leaves.  Based on the decline in DLCO he is open to getting his BNP and also CBC checked.  He also wants his kidney function checked.  He is compliant with his medications.  Of note his lung nodules have resolved on the CT chest. LFT normal Nov 2023  He continues on Esbriet 2 pills 3 times daily. 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 - 10 days ago he also fell from the ladder and sprained his ankle     OV 07/29/2022  Subjective:  Patient ID: Luke [name], male , DOB: 06-19-1945 , age 47 y.o. , MRN: [id number] , ADDRESS: [place] [id number] PCP Luke Ghent, MD Patient Care Team: Luke Ghent, MD as PCP - General Luke Pain, MD as PCP - Cardiology (Cardiology) Luke Mould, MD (Vascular Surgery) Luke Silence, MD (Gastroenterology) Luke Boston, MD (General Surgery) Luke Moore, MD (Neurosurgery) Luke [name] (Optometry) Luke Pain, MD (Cardiology) Charlton Haws, [name] as Pharmacist (Pharmacist)  This Provider for this visit: Treatment Team:  Attending Provider: Brand Males, MD    FU IPF with some emphysema. Has sCHF = - esbriet shipment at his home 03/23/21  - on esbiret 2 pills tid due to CKD hx (ofev kept as 2nd choice due to his heart issues)  -  esbriet on hold since mid march 2023 due to dizziness made by worse by nitrate and improved after stopping esbriet  - restart late may 2023  -Noiv 2023:  - Stable IPF on high-resolution CT chest , FVC and symptoms November 2023 - stable PFT March 2024  Pulmonary nodules resolved November 2023  07/29/2022 -   Chief Complaint  Patient presents with   Follow-up    Review PFT today.  Doing well.  Using exercise bike at home and gym 3 days a week.     HPI Luke [name] 77 y.o. -returns for follow-up.  At his last visit he was feeling well and his CT scan was stable his FVC was stable but his DLCO had dropped.   Therefore we decided to bring him back a little bit early to recheck his pulmonary function test.  He now tells me at the last pulmonary function test he did not have his dentures and possibly there was an air leak.  Today his FVC and DLCO are all stable.  He feels good.  He is tolerating his pirfenidone well.  Weight is stable.  He is dealing with other issues other than IPF.  He says his right lower extremity one of the main arteries is got claudication again.  He has a right foot callus that is now an ulcer.  He has to have surgery for both.  He is dealing with these issues.  Most recent liver function test was in February 2024 and pirfenidone and normal.  His current symptom scores are listed below.  His walking desaturation test is stable.       SYMPTOM SCALE - ILD 01/02/2021  05/10/2021 191# 08/08/2021 188# -4 miles an hour on a treadmill for 30 minutes.  Covers 2 miles on no incline 10/03/2021  01/10/2022 187# 04/04/2022   07/29/2022   O2 use ra ra ra ra ra ra ra  Shortness of Breath 0 -> 5 scale with 5 being worst (score 6 If unable to do)        At rest 0 0 0 0 0 0 0  Simple tasks - showers, clothes change, eating, shaving 0 0 0  0 0 0  Household (dishes, doing bed, laundry) 0 0 0 0 0 0 0  Shopping 0 0 0 0 0 0 2  Walking level at own pace 0 0 0 00 0 0 2  Walking up Stairs 2 1 2 2 2 4 2   Total (30-36) Dyspnea Score 2 1 2 2 2 4 6   How bad is your cough? 0 0 0 00 0 0 0  How bad is your fatigue 00 0 0 0 0 0 0  How bad is nausea 0 0 0 0 0 0 0  How bad is vomiting?  0 0 0 0  0 0  How bad is diarrhea? 00 0 0 0 0 0 0  How bad is anxiety? 0 0 0 0 00 0 0  How bad is depression 0 00 0 0 0 0 0  000      Simple office walk 185 feet x  3 laps goal with forehead probe 01/02/2021  08/08/2021  01/10/2022  07/29/2022   O2 used ra ra ra ra  Number laps completed 3 3 2 3   Comments about pace avg     Resting Pulse Ox/HR 97% and 71/min 98% and HR 71 98% and HR66 98% and HR 65  Final Pulse Ox/HR  95% and 94/min 91% and HR 86 93% and HR 91 92% an dHR 84  Desaturated </= 88% no no No no  Desaturated <= 3% points no Yes, 7 poins Yes 5 ponts Yes 6 potn  Got Tachycardic >/= 90/min yes no yes no  Symptoms at end of test none none none none  Miscellaneous comments x        PFT     Latest Ref Rng & Units 07/29/2022   10:16 AM 04/04/2022    1:20 PM 01/10/2022    1:42 PM 10/02/2021    8:45 AM 05/10/2021   11:45 AM  PFT Results  FVC-Pre L 3.83  P 3.79  P 3.73  4.09  3.63   FVC-Predicted Pre % 83  P 82  P 81  88  78   Pre FEV1/FVC % % 67  P 68  P 69  68  70   FEV1-Pre L 2.56  P 2.57  P 2.56  2.77  2.55   FEV1-Predicted Pre % 77  P 77  P 77  82  75   DLCO uncorrected ml/min/mmHg 11.69  P 9.71  P 11.84  13.10  11.58   DLCO UNC% % 43  P 36  P 44  48  43   DLCO corrected ml/min/mmHg 11.69  P 9.71  P 11.84  13.45  11.36   DLCO COR %Predicted % 43  P 36  P 44  50  42   DLVA Predicted % 56  P 47  P 67  62  56     P Preliminary result       has a past medical history of AAA (abdominal aortic aneurysm) ([place]) (2011), Anxiety, Atrial fibrillation ([place]), Automatic implantable cardioverter-defibrillator in situ, CAD (coronary artery disease), Cardiomyopathy ([place]), Diabetes mellitus, Drug therapy, Ejection fraction < 50%, Ejection fraction < 50%, Fall due to ice or snow (Feb.  19, 2015), HLD (hyperlipidemia), Hypertension, ICD (implantable cardioverter-defibrillator) in place, LBBB (left bundle branch block), Low testosterone, Myocardial infarction [facility]), Pacemaker, PAD (peripheral artery disease) ([place]), and Pancreatitis.   reports that he quit smoking about 18 years ago. His smoking use included cigarettes. He started smoking about 60 years ago. He has a 80.00 pack-year smoking history. He has never been exposed to tobacco smoke. He has never used smokeless tobacco.  Past Surgical History:  Procedure Laterality Date   ABDOMINAL AORTAGRAM N/A 10/28/2011   Procedure: ABDOMINAL [name];  Surgeon:  Luke Mould, MD;  [facility]: [facility] CATH LAB;  Service: Cardiovascular;  Laterality: N/A;   ABDOMINAL AORTIC ANEURYSM REPAIR     EVAR    ABDOMINAL AORTOGRAM N/A 05/27/2022   Procedure: ABDOMINAL AORTOGRAM;  Surgeon: Waynetta Sandy, MD;  Location: [place] CV LAB;  Service: Cardiovascular;  Laterality: N/A;   BI-VENTRICULAR IMPLANTABLE CARDIOVERTER DEFIBRILLATOR N/A 07/16/2012   Procedure: BI-VENTRICULAR IMPLANTABLE CARDIOVERTER DEFIBRILLATOR  (CRT-D);  Surgeon: Deboraha Sprang, MD;  Location: [facility] CATH LAB;  Service: Cardiovascular;  Laterality: N/A;   BIV ICD GENERATOR CHANGEOUT N/A 10/25/2020   Procedure: BIV ICD GENERATOR CHANGEOUT;  Surgeon: Deboraha Sprang, MD;  Location: [place] CV LAB;  Service: Cardiovascular;  Laterality: N/A;   CARDIAC CATHETERIZATION     COLONOSCOPY  03/23/2018; 2020   ESOPHAGOGASTRODUODENOSCOPY N/A 02/12/2019   Procedure: ESOPHAGOGASTRODUODENOSCOPY (EGD);  Surgeon: Jackquline Denmark, MD;  Location: [facility] ENDOSCOPY;  Service: Endoscopy;  Laterality: N/A;   ESOPHAGOGASTRODUODENOSCOPY (EGD) WITH PROPOFOL N/A 02/05/2018   Procedure: ESOPHAGOGASTRODUODENOSCOPY (EGD) WITH PROPOFOL;  Surgeon: Mauri Pole, MD;  Location: [facility] ENDOSCOPY;  Service: Endoscopy;  Laterality: N/A;   ESOPHAGOGASTRODUODENOSCOPY (EGD) WITH PROPOFOL N/A 09/27/2018   Procedure: ESOPHAGOGASTRODUODENOSCOPY (EGD) WITH PROPOFOL;  Surgeon: Carol Ada, MD;  Location: [place];  Service: Endoscopy;  Laterality: N/A;   FOREIGN BODY REMOVAL  09/27/2018   Procedure: FOREIGN BODY REMOVAL;  Surgeon: Carol Ada, MD;  Location: [facility] ENDOSCOPY;  Service: Endoscopy;;   FOREIGN BODY REMOVAL  02/12/2019   Procedure: FOREIGN BODY REMOVAL;  Surgeon: Jackquline Denmark, MD;  Location: [facility] ENDOSCOPY;  Service: Endoscopy;;   HERNIA REPAIR  Jan. 9, 2015   INSERTION OF MESH N/A 05/14/2013   Procedure: INSERTION OF MESH;  Surgeon: Adin Hector, MD;  Location: [place];  Service: General;  Laterality: N/A;   LEFT  HEART CATH AND CORONARY ANGIOGRAPHY N/A 07/17/2021   Procedure: LEFT HEART CATH AND CORONARY ANGIOGRAPHY;  Surgeon: [place] Sine, MD;  Location: [place] CV LAB;  Service: Cardiovascular;  Laterality: N/A;   LOWER EXTREMITY ANGIOGRAM Bilateral 10/28/2011   Procedure: LOWER EXTREMITY ANGIOGRAM;  Surgeon: Luke Mould, MD;  Location: [facility] CATH LAB;  Service:  Cardiovascular;  Laterality: Bilateral;   LOWER EXTREMITY ANGIOGRAPHY  05/27/2022   Procedure: Lower Extremity Angiography;  Surgeon: Waynetta Sandy, MD;  Location: [place] CV LAB;  Service: Cardiovascular;;   PACEMAKER INSERTION  07-16-12   pacemaker/defibrilator   POLYPECTOMY     POSTERIOR CERVICAL FUSION/FORAMINOTOMY N/A 06/09/2014   Procedure: Laminectomy - Cervical two-Cervcial four posterior cervical instrumented fusion Cervical two-cervical four;  Surgeon: Luke Moore, MD;  Location: [place] NEURO ORS;  Service: Neurosurgery;  Laterality: N/A;  posterior    SAVORY DILATION N/A 02/05/2018   Procedure: SAVORY DILATION;  Surgeon: Mauri Pole, MD;  [facility]: [facility] ENDOSCOPY;  Service: Endoscopy;  Laterality: N/A;   TONSILLECTOMY     as a child    UMBILICAL HERNIA REPAIR N/A 05/14/2013   Procedure: LAPAROSCOPIC exploration and repair of hernia in abdominal ;  Surgeon: Adin Hector, MD;  Location: [place];  Service: General;  Laterality: N/A;   UPPER GASTROINTESTINAL ENDOSCOPY  2020    Allergies  Allergen Reactions   Aldactone [Spironolactone] Other (See Comments)    Hyperkalemia   Codeine Rash and Hives   Atorvastatin     Myalgias with lipitor.  Does tolerate simvastatin.     Januvia [Sitagliptin] Other (See Comments)    Diarrhea and heart racing   Jardiance [Empagliflozin] Other (See Comments)    Polyuria; excessive weight loss   Lisinopril     Possible cause of pancreatitis    Immunization History  Administered Date(s) Administered   Fluad Quad(high Dose 65+) 02/23/2020, 04/13/2021   Influenza Split  01/31/2011, 02/11/2012   Influenza Whole 02/27/2010   Influenza, High Dose Seasonal PF 02/08/2022   Influenza,inj,Quad PF,6+ Mos 03/18/2013, 03/25/2014, 01/19/2015, 02/21/2016, 02/27/2017, 02/12/2018, 02/18/2019   Influenza-Unspecified 03/03/2020   PFIZER(Purple Top)SARS-COV-2 Vaccination 05/23/2019, 06/12/2019, 02/08/2020, 10/09/2020   Pfizer Covid-19 Vaccine Bivalent Booster 32yrs & up 08/02/2021   Pneumococcal Conjugate-13 10/18/2014   Pneumococcal Polysaccharide-23 11/14/2011   Td 07/05/2010    Family History  Problem Relation Age of Onset   Hypertension Mother    Stroke Mother    Hyperlipidemia Mother    Lung cancer Father    Diabetes Sister    Heart disease Sister        Before age 64   Hypertension Sister    Hyperlipidemia Sister    Heart attack Sister    Hypertension Son    Colon cancer Neg Hx    Prostate cancer Neg Hx    Esophageal cancer Neg Hx    Stomach cancer Neg Hx    Rectal cancer Neg Hx    Colon polyps Neg Hx    Pancreatic cancer Neg Hx      Current Outpatient Medications:    albuterol (VENTOLIN HFA) 108 (90 Base) MCG/ACT inhaler, Inhale 2 puffs into the lungs every 6 (six) hours as needed for wheezing or shortness of breath., Disp: 8 g, Rfl: 6   amLODipine (NORVASC) 10 MG tablet, TAKE 1 TABLET(10 MG) BY MOUTH DAILY, Disp: 90 tablet, Rfl: 3   aspirin EC 81 MG tablet, Take 81 mg by mouth every evening., Disp: , Rfl:    carvedilol (COREG) 25 MG tablet, TAKE 1 TABLET BY MOUTH TWICE DAILY WITH MEALS, Disp: 180 tablet, Rfl: 3   Cholecalciferol (VITAMIN D3) 10 MCG (400 UNIT) CAPS, Take 1 capsule by mouth daily., Disp: , Rfl:    ESBRIET 267 MG TABS, Take 2 tablets (534 mg total) by mouth in the morning, at noon, and at bedtime. **NOTE LOW DOSE  AS MAINTENANCE**, Disp: 540 tablet, Rfl: 1   famotidine (PEPCID) 20 MG tablet, Take 1 tablet (20 mg total) by mouth at bedtime., Disp: 90 tablet, Rfl: 3   glipiZIDE (GLUCOTROL) 5 MG tablet, TAKE 1 TABLET(5 MG) BY MOUTH TWICE  DAILY BEFORE A MEAL, Disp: 180 tablet, Rfl: 1   glucose blood (ACCU-CHEK AVIVA PLUS) test strip, AS DIRECTED TO CHECK BLOOD SUGAR TWICE DAILY, Disp: 200 strip, Rfl: 3   insulin glargine (LANTUS SOLOSTAR) 100 UNIT/ML Solostar Pen, INJECT 0.3 TO 0.35 MLS(30 TO 35 UNITS) INTO THE SKIN EVERY DAY (Patient taking differently: Inject 30 Units into the skin at bedtime.), Disp: 15 mL, Rfl: 3   isosorbide mononitrate (IMDUR) 30 MG 24 hr tablet, Take 1 tablet (30 mg total) by mouth daily., Disp: 180 tablet, Rfl: 3   metFORMIN (GLUCOPHAGE) 500 MG tablet, TAKE 2 TABLETS BY MOUTH TWICE DAILY WITH FOOD, Disp: 360 tablet, Rfl: 3   Multiple Vitamin (MULTIVITAMIN WITH MINERALS) TABS tablet, Take 1 tablet by mouth every evening., Disp: , Rfl:    omeprazole (PRILOSEC) 40 MG capsule, TAKE 1 CAPSULE(40 MG) BY MOUTH DAILY, Disp: 90 capsule, Rfl: 3   simvastatin (ZOCOR) 40 MG tablet, TAKE 1 TABLET BY MOUTH AT BEDTIME, Disp: 90 tablet, Rfl: 3   umeclidinium bromide (INCRUSE ELLIPTA) 62.5 MCG/ACT AEPB, INHALE 1 PUFF INTO THE LUNGS DAILY, Disp: 30 each, Rfl: 0   valsartan (DIOVAN) 40 MG tablet, Take 20 mg by mouth daily., Disp: , Rfl:    vitamin B-12 (CYANOCOBALAMIN) 1000 MCG tablet, Take 1,000 mcg by mouth in the morning., Disp: , Rfl:       Objective:   Vitals:   07/29/22 1055  BP: 120/66  Pulse: 69  Temp: (!) 97.5 F (36.4 C)  TempSrc: Oral  SpO2: 98%  Weight: 185 lb 9.6 oz (84.2 kg)  Height: 6' (1.829 m)    Estimated body mass index is 25.17 kg/m as calculated from the following:   Height as of this encounter: 6' (1.829 m).   Weight as of this encounter: 185 lb 9.6 oz (84.2 kg).  @WEIGHTCHANGE @  [employer]   07/29/22 1055  Weight: 185 lb 9.6 oz (84.2 kg)     Physical Exam    General: No distress. Looks well Neuro: Alert and Oriented x 3. GCS 15. Speech normal Psych: Pleasant Resp:  Barrel Chest - no.  Wheeze - no, Crackles - YEs, No overt respiratory distress CVS: Normal heart sounds.  Murmurs - no Ext: Stigmata of Connective Tissue Disease - no HEENT: Normal upper airway. PEERL +. No post nasal drip        Assessment:       ICD-10-CM   1. IPF (idiopathic pulmonary fibrosis) (HCC)  J84.112 Hepatic function panel    Pulmonary function test    2. Centrilobular emphysema ([place])  J43.2     3. Medication monitoring encounter  Z51.81     4. Vaccine counseling  Z71.85          Plan:     Patient Instructions     ICD-10-CM   1. IPF (idiopathic pulmonary fibrosis) (HCC)  J84.112 Hepatic function panel    Hepatic function panel    2. Centrilobular emphysema ([place])  J43.2     3. 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[place])  J43.9    J84.10       Re IPF  - sstable on symptoms , CT, PFT  and walk as of 07/29/2022 - noted wanted to hold  off clinical trials right now - best wisehs for leg surgery  Plan - recommend RSV vaccine - cotninue esbriet  at 2 pill thress times daily; take with food - check LFT in 3 months - clinical trial consideration in future - consider TETON study with inh - do spirom/dlco  4 months    Emphysema (minor component)   Stable  Plan -continue spiriva respimat 2 puff daily  - continue  albuterol prn  Lung nodules right - cluster - new Aug 2023 - resolved NOv 2023  Plan  -reassure   Followup -  -- do LFT in 12 week - spriometyr and dlco in 16 weeks - Breckin Zafar 30 min visit after spiro/dlco in 16 weeks but after PFT   - symptom score and walk test at followup    SIGNATURE    Dr. Brand [name], M.D., F.C.C.P,  Pulmonary and Critical Care Medicine Staff Physician, [place] Director - Interstitial Lung Disease  Program  Pulmonary [place] [facility] [place], [place], [address]  Pager: [phone], If no answer or between  15:00h - 7:00h: call [phone] Telephone: [phone]  11:34 AM 07/29/2022

## 2022-08-03 ENCOUNTER — Other Ambulatory Visit: Payer: Self-pay | Admitting: Internal Medicine

## 2022-08-05 ENCOUNTER — Ambulatory Visit: Payer: Medicare Other | Admitting: Podiatry

## 2022-08-07 ENCOUNTER — Ambulatory Visit (INDEPENDENT_AMBULATORY_CARE_PROVIDER_SITE_OTHER): Payer: Medicare Other | Admitting: Vascular Surgery

## 2022-08-07 DIAGNOSIS — I70261 Atherosclerosis of native arteries of extremities with gangrene, right leg: Secondary | ICD-10-CM

## 2022-08-07 DIAGNOSIS — L97519 Non-pressure chronic ulcer of other part of right foot with unspecified severity: Secondary | ICD-10-CM

## 2022-08-07 NOTE — Progress Notes (Signed)
Virtual Visit via Telephone Note  I connected with Luke [name] on 08/07/2022 using the [email or web address] by telephone and verified that I was speaking with the correct person using two identifiers. Patient was located at home alone and I am the office.   The limitations of evaluation and management by telemedicine and the availability of in person appointments have been previously discussed with the patient and are documented in the patients chart. The patient expressed understanding and consented to proceed.  PCP: Tonia Ghent, MD   Chief Complaint: Right foot wound  History of Present Illness: Luke [name] is a 77 y.o. male with with a history of a right foot wound followed by podiatry that is failed to heal despite ongoing podiatric evaluation.  He did undergo angiography of the right lower extremity which demonstrated significant common femoral disease as well as occluded SFA proximally with reconstitution of the tibials distally we discussed the need for common femoral endarterectomy as well as bypass.  Patient states that he has been evaluated by both cardiology as well as pulmonology and that he is stable from the standpoint.  He remains on aspirin and statin is not on any blood thinners.  Past Medical History:  Diagnosis Date   AAA (abdominal aortic aneurysm) ([place]) 2011   Per vascular surgery   Anxiety    no med. in use for anxiety but pt. speaks openly of his stress & anxious feelings regarding impending surgery     Atrial fibrillation ([place])    Automatic implantable cardioverter-defibrillator in situ    CAD (coronary artery disease)    Presumed CAD with nuclear scan October 09, 2011,  large anteroseptal MI and inferior MI. Catheterization scheduled October 15, 2011   Cardiomyopathy [facility])    Nuclear, October 09, 2011, EF 30%, multiple focal wall motion abnormalities   Diabetes mellitus    type II   Drug therapy    Hyperkalemia with spironolactone   Ejection fraction < 50%     EF 30%, nuclear, October 09, 2011   Ejection fraction < 50%    EF previously 30%  //   EF 30-35%, echo, July 14, 2012, severe diffuse hypokinesis, PA pressure 43 mm mercury   Fall due to ice or snow Feb.  19, 2015   HLD (hyperlipidemia)    Hypertension    white coat HTN-- often elevated in office and controlled on outside checks.   ICD (implantable cardioverter-defibrillator) in place    CRT-D placed March, 2014 complete heart block and k dysfunction   LBBB (left bundle branch block)    LBBB on EKG October 11, 2011,  no prior EKG has been done   Low testosterone    Hx of   Myocardial infarction ([place])    Told in 2015-never knew-showed up on stress test for AAA;    Pacemaker    PAD (peripheral artery disease) ([place])    Pancreatitis     Past Surgical History:  Procedure Laterality Date   ABDOMINAL AORTAGRAM N/A 10/28/2011   Procedure: ABDOMINAL [name];  Surgeon: Angelia Mould, MD;  Location: [facility] CATH LAB;  Service: Cardiovascular;  Laterality: N/A;   ABDOMINAL AORTIC ANEURYSM REPAIR     EVAR    ABDOMINAL AORTOGRAM N/A 05/27/2022   Procedure: ABDOMINAL AORTOGRAM;  Surgeon: Waynetta Sandy, MD;  Location: [place] CV LAB;  Service: Cardiovascular;  Laterality: N/A;   BI-VENTRICULAR IMPLANTABLE CARDIOVERTER DEFIBRILLATOR N/A 07/16/2012   Procedure: BI-VENTRICULAR IMPLANTABLE CARDIOVERTER DEFIBRILLATOR  (  CRT-D);  Surgeon: Deboraha Sprang, MD;  Location: [facility] CATH LAB;  Service: Cardiovascular;  Laterality: N/A;   BIV ICD GENERATOR CHANGEOUT N/A 10/25/2020   Procedure: BIV ICD GENERATOR CHANGEOUT;  Surgeon: Deboraha Sprang, MD;  Location: [place] CV LAB;  Service: Cardiovascular;  Laterality: N/A;   CARDIAC CATHETERIZATION     COLONOSCOPY  03/23/2018; 2020   ESOPHAGOGASTRODUODENOSCOPY N/A 02/12/2019   Procedure: ESOPHAGOGASTRODUODENOSCOPY (EGD);  Surgeon: Jackquline Denmark, MD;  Location: [facility] ENDOSCOPY;  Service: Endoscopy;  Laterality: N/A;   ESOPHAGOGASTRODUODENOSCOPY (EGD) WITH  PROPOFOL N/A 02/05/2018   Procedure: ESOPHAGOGASTRODUODENOSCOPY (EGD) WITH PROPOFOL;  Surgeon: Mauri Pole, MD;  [facility] ENDOSCOPY;  Service: Endoscopy;  Laterality: N/A;   ESOPHAGOGASTRODUODENOSCOPY (EGD) WITH PROPOFOL N/A 09/27/2018   Procedure: ESOPHAGOGASTRODUODENOSCOPY (EGD) WITH PROPOFOL;  Surgeon: Carol Ada, MD;  Location: [place];  Service: Endoscopy;  Laterality: N/A;   FOREIGN BODY REMOVAL  09/27/2018   Procedure: FOREIGN BODY REMOVAL;  Surgeon: Carol Ada, MD;  Location: [facility] ENDOSCOPY;  Service: Endoscopy;;   FOREIGN BODY REMOVAL  02/12/2019   Procedure: FOREIGN BODY REMOVAL;  Surgeon: Jackquline Denmark, MD;  Location[facility] ENDOSCOPY;  Service: Endoscopy;;   HERNIA REPAIR  Jan. 9, 2015   INSERTION OF MESH N/A 05/14/2013   Procedure: INSERTION OF MESH;  Surgeon: Adin Hector, MD;  Location: [place];  Service: General;  Laterality: N/A;   LEFT HEART CATH AND CORONARY ANGIOGRAPHY N/A 07/17/2021   Procedure: LEFT HEART CATH AND CORONARY ANGIOGRAPHY;  Surgeon: [place] Sine, MD;  Location: [place] CV LAB;  Service: Cardiovascular;  Laterality: N/A;   LOWER EXTREMITY ANGIOGRAM Bilateral 10/28/2011   Procedure: LOWER EXTREMITY ANGIOGRAM;  Surgeon: Angelia Mould, MD;  Location: [facility] CATH LAB;  Service: Cardiovascular;  Laterality: Bilateral;   LOWER EXTREMITY ANGIOGRAPHY  05/27/2022   Procedure: Lower Extremity Angiography;  Surgeon: Waynetta Sandy, MD;  Location: [place] CV LAB;  Service: Cardiovascular;;   PACEMAKER INSERTION  07-16-12   pacemaker/defibrilator   POLYPECTOMY     POSTERIOR CERVICAL FUSION/FORAMINOTOMY N/A 06/09/2014   Procedure: Laminectomy - Cervical two-Cervcial four posterior cervical instrumented fusion Cervical two-cervical four;  Surgeon: Eustace Moore, MD;  Location: [place] NEURO ORS;  Service: Neurosurgery;  Laterality: N/A;  posterior    SAVORY DILATION N/A 02/05/2018   Procedure: SAVORY DILATION;  Surgeon: Mauri Pole, MD;   [facility] ENDOSCOPY;  Service: Endoscopy;  Laterality: N/A;   TONSILLECTOMY     as a child    UMBILICAL HERNIA REPAIR N/A 05/14/2013   Procedure: LAPAROSCOPIC exploration and repair of hernia in abdominal ;  Surgeon: Adin Hector, MD;  Location: [place];  Service: General;  Laterality: N/A;   UPPER GASTROINTESTINAL ENDOSCOPY  2020    No outpatient medications have been marked as taking for the 08/07/22 encounter (Appointment) with Waynetta Sandy, MD.    12 system ROS was negative unless otherwise noted in HPI   Observations/Objective: Patient demonstrates good understanding of our discussion today.  Assessment and Plan: 78 year old male with history of chronic right lower extremity limb threatening ischemia with ulceration followed by podiatry with ABI of 0.6 and toe pressure of 42.  Unfortunately does not have any satisfactory vein for bypass.  We discussed his options being continued watchful waiting versus bypass with common femoral endarterectomy.  Also discussed the risk of the bypass particularly given his underlying cardiopulmonary disease and he demonstrates good understanding.  Will get him scheduled in the near future for right common  femoral endarterectomy with femoral to below-knee popliteal artery bypass with vein or graft.  He will likely require 5 to 7 days in the hospital possible rehab afterwards.   I discussed the assessment and treatment plan with the patient. The patient was provided an opportunity to ask questions and all were answered. The patient agreed with the plan and demonstrated an understanding of the instructions.   The patient was advised to call back or seek an in-person evaluation if the symptoms worsen or if the condition fails to improve as anticipated.  I spent 12 minutes with the patient via telephone encounter.   Signed, Servando Snare [facility] Office: [phone]  08/07/2022, 9:28 AM

## 2022-08-09 ENCOUNTER — Telehealth: Payer: Self-pay

## 2022-08-09 NOTE — Telephone Encounter (Signed)
Pt returned call, two identifiers used. Pt stated that he had an episode of angina and SOB yesterday while outside. He has made an appt with his cardiologist, Dr. Anne Fu, for 4/10. He will call back after this appt to schedule depending on his cardiology visit. Confirmed understanding.

## 2022-08-09 NOTE — Telephone Encounter (Addendum)
Called pt on home number to schedule surgery, no answer, lf vm.  Called pt on cell number to schedule surgery, no answer, lf vm.

## 2022-08-12 ENCOUNTER — Ambulatory Visit (INDEPENDENT_AMBULATORY_CARE_PROVIDER_SITE_OTHER): Payer: Medicare Other

## 2022-08-12 ENCOUNTER — Ambulatory Visit (INDEPENDENT_AMBULATORY_CARE_PROVIDER_SITE_OTHER): Payer: Medicare Other | Admitting: Podiatry

## 2022-08-12 DIAGNOSIS — L97502 Non-pressure chronic ulcer of other part of unspecified foot with fat layer exposed: Secondary | ICD-10-CM

## 2022-08-12 MED ORDER — DOXYCYCLINE HYCLATE 100 MG PO TABS: 100.0000 mg | ORAL_TABLET | Freq: Two times a day (BID) | ORAL | 0 refills | Status: DC

## 2022-08-12 NOTE — Progress Notes (Signed)
Chief Complaint  Patient presents with   Foot Pain    R/ doing ok, foot is swelling some. Would like to get toenails clipped if possible.    Subjective:  77 y.o. male with PMHx of diabetes mellitus presenting today for recurrence of the ulcer to the plantar aspect of the first MTP of the right foot.  Patient states that he has been dealing with a callus/ulcer to the area since 2018.    We have discussed surgery in the past and currently the patient is working closely with vascular to optimize circulation prior to any surgical intervention.  Appreciate the input and specialty is always.  Presenting for follow-up evaluation of the wound   Past Medical History:  Diagnosis Date   AAA (abdominal aortic aneurysm) (HCC) 2011   Per vascular surgery   Anxiety    no med. in use for anxiety but pt. speaks openly of his stress & anxious feelings regarding impending surgery     Atrial fibrillation (HCC)    Automatic implantable cardioverter-defibrillator in situ    CAD (coronary artery disease)    Presumed CAD with nuclear scan October 09, 2011,  large anteroseptal MI and inferior MI. Catheterization scheduled October 15, 2011   Cardiomyopathy [facility](HCC)    Nuclear, October 09, 2011, EF 30%, multiple focal wall motion abnormalities   Diabetes mellitus    type II   Drug therapy    Hyperkalemia with spironolactone   Ejection fraction < 50%    EF 30%, nuclear, October 09, 2011   Ejection fraction < 50%    EF previously 30%  //   EF 30-35%, echo, July 14, 2012, severe diffuse hypokinesis, PA pressure 43 mm mercury   Fall due to ice or snow Feb.  19, 2015   HLD (hyperlipidemia)    Hypertension    white coat HTN-- often elevated in office and controlled on outside checks.   ICD (implantable cardioverter-defibrillator) in place    CRT-D placed March, 2014 complete heart block and k dysfunction   LBBB (left bundle branch block)    LBBB on EKG October 11, 2011,  no prior EKG has been done   Low testosterone    Hx of    Myocardial infarction (HCC)    Told in 2015-never knew-showed up on stress test for AAA;    Pacemaker    PAD (peripheral artery disease) (HCC)    Pancreatitis     Past Surgical History:  Procedure Laterality Date   ABDOMINAL AORTAGRAM N/A 10/28/2011   Procedure: ABDOMINAL Ronny FlurryAORTAGRAM;  Surgeon: Chuck Hinthristopher S Dickson, MD;  [facility];  Service: Cardiovascular;  Laterality: N/A;   ABDOMINAL AORTIC ANEURYSM REPAIR     EVAR    ABDOMINAL AORTOGRAM N/A 05/27/2022   Procedure: ABDOMINAL AORTOGRAM;  Surgeon: Maeola Harmanain, Brandon Christopher, MD;  [facility];  Service: Cardiovascular;  Laterality: N/A;   BI-VENTRICULAR IMPLANTABLE CARDIOVERTER DEFIBRILLATOR N/A 07/16/2012   Procedure: BI-VENTRICULAR IMPLANTABLE CARDIOVERTER DEFIBRILLATOR  (CRT-D);  Surgeon: Duke SalviaSteven C Klein, MD;  [facility];  Service: Cardiovascular;  Laterality: N/A;   BIV ICD GENERATOR CHANGEOUT N/A 10/25/2020   Procedure: BIV ICD GENERATOR CHANGEOUT;  Surgeon: Duke SalviaKlein, Steven C, MD;  [facility];  Service: Cardiovascular;  Laterality: N/A;   CARDIAC CATHETERIZATION     COLONOSCOPY  03/23/2018; 2020   ESOPHAGOGASTRODUODENOSCOPY N/A 02/12/2019   Procedure: ESOPHAGOGASTRODUODENOSCOPY (EGD);  Surgeon: Lynann BolognaGupta, Rajesh, MD;  [facility];  Service: Endoscopy;  Laterality: N/A;  ESOPHAGOGASTRODUODENOSCOPY (EGD) WITH PROPOFOL N/A 02/05/2018   Procedure: ESOPHAGOGASTRODUODENOSCOPY (EGD) WITH PROPOFOL;  Surgeon: Napoleon Form, MD;  Location: [facility] ENDOSCOPY;  Service: Endoscopy;  Laterality: N/A;   ESOPHAGOGASTRODUODENOSCOPY (EGD) WITH PROPOFOL N/A 09/27/2018   Procedure: ESOPHAGOGASTRODUODENOSCOPY (EGD) WITH PROPOFOL;  Surgeon: Jeani Hawking, MD;  Location: [facility] ENDOSCOPY;  Service: Endoscopy;  Laterality: N/A;   FOREIGN BODY REMOVAL  09/27/2018   Procedure: FOREIGN BODY REMOVAL;  Surgeon: Jeani Hawking, MD;  Location: [facility] ENDOSCOPY;  Service: Endoscopy;;   FOREIGN BODY REMOVAL  02/12/2019    Procedure: FOREIGN BODY REMOVAL;  Surgeon: Lynann Bologna, MD;  Location: [facility] ENDOSCOPY;  Service: Endoscopy;;   HERNIA REPAIR  Jan. 9, 2015   INSERTION OF MESH N/A 05/14/2013   Procedure: INSERTION OF MESH;  Surgeon: Ardeth Sportsman, MD;  Location: [facility] OR;  Service: General;  Laterality: N/A;   LEFT HEART CATH AND CORONARY ANGIOGRAPHY N/A 07/17/2021   Procedure: LEFT HEART CATH AND CORONARY ANGIOGRAPHY;  Surgeon: Lennette Bihari, MD;  Location: [facility] INVASIVE CV LAB;  Service: Cardiovascular;  Laterality: N/A;   LOWER EXTREMITY ANGIOGRAM Bilateral 10/28/2011   Procedure: LOWER EXTREMITY ANGIOGRAM;  Surgeon: [name], MD;  Location[facility] CATH LAB;  Service: Cardiovascular;  Laterality: Bilateral;   LOWER EXTREMITY ANGIOGRAPHY  05/27/2022   Procedure: Lower Extremity Angiography;  Surgeon: Maeola Harman, MD;  Location[facility]VAS[facility] CV LAB;  Service: Cardiovascular;;   PACEMAKER INSERTION  07-16-12   pacemaker/defibrilator   POLYPECTOMY     POSTERIOR CERVICAL FUSION/FORAMINOTOMY N/A 06/09/2014   Procedure: Laminectomy - Cervical two-Cervcial four posterior cervical instrumented fusion Cervical two-cervical four;  Surgeon: Tia Alert, MD;  Location: [facility] NEURO ORS;  Service: Neurosurgery;  Laterality: N/A;  posterior    SAVORY DILATION N/A 02/05/2018   Procedure: SAVORY DILATION;  Surgeon: Napoleon Form, MD;  Location: [facility] ENDOSCOPY;  Service: Endoscopy;  Laterality: N/A;   TONSILLECTOMY     as a child    UMBILICAL HERNIA REPAIR N/A 05/14/2013   Procedure: LAPAROSCOPIC exploration and repair of hernia in abdominal ;  Surgeon: Ardeth Sportsman, MD;  Location: [facility] OR;  Service: General;  Laterality: N/A;   UPPER GASTROINTESTINAL ENDOSCOPY  2020    Allergies  Allergen Reactions   Aldactone [Spironolactone] Other (See Comments)    Hyperkalemia   Codeine Rash and Hives   Atorvastatin     Myalgias with lipitor.  Does tolerate simvastatin.     Januvia [Sitagliptin] Other (See  Comments)    Diarrhea and heart racing   Jardiance [Empagliflozin] Other (See Comments)    Polyuria; excessive weight loss   Lisinopril     Possible cause of pancreatitis    RT foot 07/05/2022   Objective/Physical Exam General: The patient is alert and oriented x3 in no acute distress.  Dermatology:  Ulcer to the plantar aspect of the first MTP right foot appears stable.  No significant change since last visit  New finding of superficial abscess/ulcer to the lateral portion of the right great toe adjacent to the base of the nail plate.  There was some slight purulence with expression of this area.  No malodor.  There is also some associated edema with erythema to the right foot  Vascular: Healthy adequate bleeding upon debridement of the ulcer.  Lower extremity angiography 05/27/2022 performed by Dr. Lemar Livings, vein and vascular.  Please see report.  Being closely monitored with vascular  Neurological: Light touch and protective threshold diminished bilaterally.   Musculoskeletal Exam: No  pedal deformity.  No prior amputations.  Muscle strength 5/5 all compartments  Radiographic exam RT foot 08/12/2022: Mostly unchanged from prior x-rays.  Stable.  No radiolucency or gas within the tissues and does not present on prior x-rays.  No radiographic evidence of obvious osseous erosions or signs of osteomyelitis  Assessment: 1.  Ulcer plantar aspect of the first MTP right secondary to diabetes mellitus 2. diabetes mellitus w/ peripheral neuropathy 3.  New finding of purulent drainage/superficial abscess lateral border of the right great toe 4.  Moderate cellulitis right foot    Plan of Care:  -Patient evaluated.  X-rays reviewed.  Patient declined debridement today -There was some slight purulence that was expressed from the lateral portion of the right great toe.  This purulence was cultured and sent to pathology. -Prescription for doxycycline 100 mg 3 times daily #20 -Advised the  patient to go immediately to the emergency department if the erythema or purulence worsens or if he experiences chills or any systemic signs of infection -Betadine ointment provided to apply daily with a light dressing  -Continue offloading felt dancers pads that were applied to the insoles in the shoes to offload pressure from the foot and wound -We have discussed surgery in the past which would include a fibular sesamoidectomy.  Prior to this he does need vascular clearance.  Patient is working closely with vascular.  Optimize his circulation and increased potential for wound healing  -Return to clinic 3 weeks   Felecia Shelling, DPM [facility]  Dr. Felecia Shelling, DPM    [address] [place], [place] [address]                Office [phone]  Fax [phone]

## 2022-08-12 NOTE — Addendum Note (Signed)
Addended by: Philipp Deputy A on: 08/12/2022 04:56 PM   Modules accepted: Orders

## 2022-08-14 ENCOUNTER — Other Ambulatory Visit: Payer: Self-pay | Admitting: Cardiology

## 2022-08-14 ENCOUNTER — Ambulatory Visit: Payer: Medicare Other | Attending: Cardiology | Admitting: Cardiology

## 2022-08-14 ENCOUNTER — Encounter: Payer: Self-pay | Admitting: Cardiology

## 2022-08-14 VITALS — BP 142/70 | HR 72 | Ht 72.0 in | Wt 185.4 lb

## 2022-08-14 DIAGNOSIS — I5022 Chronic systolic (congestive) heart failure: Secondary | ICD-10-CM | POA: Insufficient documentation

## 2022-08-14 DIAGNOSIS — I447 Left bundle-branch block, unspecified: Secondary | ICD-10-CM | POA: Diagnosis not present

## 2022-08-14 NOTE — Progress Notes (Signed)
Cardiology Office Note:    Date:  08/14/2022   ID:  Luke [name], DOB [birth year], MRN [id number]  PCP:  [name], MD   [facility] Providers Cardiologist:  Donato Schultz, MD     Referring MD: [name], MD    History of Present Illness:    Luke [name] is a 77 y.o. male here for for follow-up of coronary artery disease, cardiac catheterization 07/17/2021 80% LAD calcified lesion treated medically, distal RCA CTO disease with collateral blood flow from circumflex, EF 40 to 45%, CRT ICD.  During a prior visit on 01/01/2021 ischemic cardiomyopathy left bundle branch block CRT ICD Dr. Graciela Husbands after an intermittent complete heart block.  He goes by [employer].  He is a former Dr. Myrtis [name] patient.  No previous mode switches, normal device function.  He is a [place] vet.     Stage 3 kidney disease. He is currently taking valsartan. He takes his medication at night time because if he takes his medication during the day he feels unwell and has trouble with his balance.  He tried to take Jardiance but this caused significant diarrhea.  He experiences shortness of breath, mainly with exertion.  See below for details.  Discussed revascularization of right limb.  Discussed some shortness of breath with cutting the grass.  Anxiety has increased.  Having some trouble sleeping.  Wife present.  No significant chest discomfort.  Past Medical History:  Diagnosis Date   AAA (abdominal aortic aneurysm) 2011   Per vascular surgery   Anxiety    no med. in use for anxiety but pt. speaks openly of his stress & anxious feelings regarding impending surgery     Atrial fibrillation    Automatic implantable cardioverter-defibrillator in situ    CAD (coronary artery disease)    Presumed CAD with nuclear scan October 09, 2011,  large anteroseptal MI and inferior MI. Catheterization scheduled October 15, 2011   Cardiomyopathy    Nuclear, October 09, 2011, EF 30%, multiple focal wall motion abnormalities    Diabetes mellitus    type II   Drug therapy    Hyperkalemia with spironolactone   Ejection fraction < 50%    EF 30%, nuclear, October 09, 2011   Ejection fraction < 50%    EF previously 30%  //   EF 30-35%, echo, July 14, 2012, severe diffuse hypokinesis, PA pressure 43 mm mercury   Fall due to ice or snow Feb.  19, 2015   HLD (hyperlipidemia)    Hypertension    white coat HTN-- often elevated in office and controlled on outside checks.   ICD (implantable cardioverter-defibrillator) in place    CRT-D placed March, 2014 complete heart block and k dysfunction   LBBB (left bundle branch block)    LBBB on EKG October 11, 2011,  no prior EKG has been done   Low testosterone    Hx of   Myocardial infarction    Told in 2015-never knew-showed up on stress test for AAA;    Pacemaker    PAD (peripheral artery disease)    Pancreatitis     Past Surgical History:  Procedure Laterality Date   ABDOMINAL AORTAGRAM N/A 10/28/2011   Procedure: ABDOMINAL Ronny Flurry;  Surgeon: [name], MD;  Location: [facility] CATH LAB;  Service: Cardiovascular;  Laterality: N/A;   ABDOMINAL AORTIC ANEURYSM REPAIR     EVAR    ABDOMINAL AORTOGRAM N/A 05/27/2022   Procedure: ABDOMINAL AORTOGRAM;  Surgeon: Maeola Harman,  MD;  Location: [facility] INVASIVE CV LAB;  Service: Cardiovascular;  Laterality: N/A;   BI-VENTRICULAR IMPLANTABLE CARDIOVERTER DEFIBRILLATOR N/A 07/16/2012   Procedure: BI-VENTRICULAR IMPLANTABLE CARDIOVERTER DEFIBRILLATOR  (CRT-D);  Surgeon: Duke Salvia, MD;  Location: [facility] CATH LAB;  Service: Cardiovascular;  Laterality: N/A;   BIV ICD GENERATOR CHANGEOUT N/A 10/25/2020   Procedure: BIV ICD GENERATOR CHANGEOUT;  Surgeon: Duke Salvia, MD;  Location: [facility] INVASIVE CV LAB;  Service: Cardiovascular;  Laterality: N/A;   CARDIAC CATHETERIZATION     COLONOSCOPY  03/23/2018; 2020   ESOPHAGOGASTRODUODENOSCOPY N/A 02/12/2019   Procedure: ESOPHAGOGASTRODUODENOSCOPY (EGD);  Surgeon: Lynann Bologna, MD;   Location: [facility] ENDOSCOPY;  Service: Endoscopy;  Laterality: N/A;   ESOPHAGOGASTRODUODENOSCOPY (EGD) WITH PROPOFOL N/A 02/05/2018   Procedure: ESOPHAGOGASTRODUODENOSCOPY (EGD) WITH PROPOFOL;  Surgeon: Napoleon Form, MD;  Location: [facility] ENDOSCOPY;  Service: Endoscopy;  Laterality: N/A;   ESOPHAGOGASTRODUODENOSCOPY (EGD) WITH PROPOFOL N/A 09/27/2018   Procedure: ESOPHAGOGASTRODUODENOSCOPY (EGD) WITH PROPOFOL;  Surgeon: Jeani Hawking, MD;  Location: [facility] ENDOSCOPY;  Service: Endoscopy;  Laterality: N/A;   FOREIGN BODY REMOVAL  09/27/2018   Procedure: FOREIGN BODY REMOVAL;  Surgeon: Jeani Hawking, MD;  Location: [facility] ENDOSCOPY;  Service: Endoscopy;;   FOREIGN BODY REMOVAL  02/12/2019   Procedure: FOREIGN BODY REMOVAL;  Surgeon: Lynann Bologna, MD;  Location: [facility] ENDOSCOPY;  Service: Endoscopy;;   HERNIA REPAIR  Jan. 9, 2015   INSERTION OF MESH N/A 05/14/2013   Procedure: INSERTION OF MESH;  Surgeon: Ardeth Sportsman, MD;  Location: [facility] OR;  Service: General;  Laterality: N/A;   LEFT HEART CATH AND CORONARY ANGIOGRAPHY N/A 07/17/2021   Procedure: LEFT HEART CATH AND CORONARY ANGIOGRAPHY;  Surgeon: Lennette Bihari, MD;  Location: [facility] INVASIVE CV LAB;  Service: Cardiovascular;  Laterality: N/A;   LOWER EXTREMITY ANGIOGRAM Bilateral 10/28/2011   Procedure: LOWER EXTREMITY ANGIOGRAM;  Surgeon: [name], MD;  Location: [facility] CATH LAB;  Service: Cardiovascular;  Laterality: Bilateral;   LOWER EXTREMITY ANGIOGRAPHY  05/27/2022   Procedure: Lower Extremity Angiography;  Surgeon: Maeola Harman, MD;  Location: [facility] INVASIVE CV LAB;  Service: Cardiovascular;;   PACEMAKER INSERTION  07-16-12   pacemaker/defibrilator   POLYPECTOMY     POSTERIOR CERVICAL FUSION/FORAMINOTOMY N/A 06/09/2014   Procedure: Laminectomy - Cervical two-Cervcial four posterior cervical instrumented fusion Cervical two-cervical four;  Surgeon: Tia Alert, MD;  Location: [facility] NEURO ORS;  Service: Neurosurgery;  Laterality: N/A;   posterior    SAVORY DILATION N/A 02/05/2018   Procedure: SAVORY DILATION;  Surgeon: Napoleon Form, MD;  Location: [facility] ENDOSCOPY;  Service: Endoscopy;  Laterality: N/A;   TONSILLECTOMY     as a child    UMBILICAL HERNIA REPAIR N/A 05/14/2013   Procedure: LAPAROSCOPIC exploration and repair of hernia in abdominal ;  Surgeon: Ardeth Sportsman, MD;  Location: [facility] OR;  Service: General;  Laterality: N/A;   UPPER GASTROINTESTINAL ENDOSCOPY  2020    Current Medications: Current Meds  Medication Sig   albuterol (VENTOLIN HFA) 108 (90 Base) MCG/ACT inhaler Inhale 2 puffs into the lungs every 6 (six) hours as needed for wheezing or shortness of breath.   amLODipine (NORVASC) 10 MG tablet TAKE 1 TABLET(10 MG) BY MOUTH DAILY   aspirin EC 81 MG tablet Take 81 mg by mouth every evening.   carvedilol (COREG) 25 MG tablet TAKE 1 TABLET BY MOUTH TWICE DAILY WITH MEALS   Cholecalciferol (VITAMIN D3) 10 MCG (400 UNIT) CAPS Take 1 capsule by mouth daily.   doxycycline (VIBRA-TABS)  100 MG tablet Take 1 tablet (100 mg total) by mouth 2 (two) times daily.   ESBRIET 267 MG TABS Take 2 tablets (534 mg total) by mouth in the morning, at noon, and at bedtime. **NOTE LOW DOSE AS MAINTENANCE**   famotidine (PEPCID) 20 MG tablet Take 1 tablet (20 mg total) by mouth at bedtime.   glipiZIDE (GLUCOTROL) 5 MG tablet TAKE 1 TABLET(5 MG) BY MOUTH TWICE DAILY BEFORE A MEAL   glucose blood (ACCU-CHEK AVIVA PLUS) test strip AS DIRECTED TO CHECK BLOOD SUGAR TWICE DAILY   insulin glargine (LANTUS SOLOSTAR) 100 UNIT/ML Solostar Pen INJECT 0.3 TO 0.35 MLS(30 TO 35 UNITS) INTO THE SKIN EVERY DAY (Patient taking differently: Inject 30 Units into the skin at bedtime.)   isosorbide mononitrate (IMDUR) 30 MG 24 hr tablet Take 1 tablet (30 mg total) by mouth daily.   metFORMIN (GLUCOPHAGE) 500 MG tablet TAKE 2 TABLETS BY MOUTH TWICE DAILY WITH FOOD   Multiple Vitamin (MULTIVITAMIN WITH MINERALS) TABS tablet Take 1 tablet by mouth every  evening.   omeprazole (PRILOSEC) 40 MG capsule TAKE 1 CAPSULE(40 MG) BY MOUTH DAILY   simvastatin (ZOCOR) 40 MG tablet TAKE 1 TABLET BY MOUTH AT BEDTIME   umeclidinium bromide (INCRUSE ELLIPTA) 62.5 MCG/ACT AEPB INHALE 1 PUFF INTO THE LUNGS DAILY   valsartan (DIOVAN) 40 MG tablet Take 20 mg by mouth daily.   vitamin B-12 (CYANOCOBALAMIN) 1000 MCG tablet Take 1,000 mcg by mouth in the morning.     Allergies:   Aldactone [spironolactone], Codeine, Atorvastatin, Januvia [sitagliptin], Jardiance [empagliflozin], and Lisinopril   Social History   Socioeconomic History   Marital status: Married    Spouse name: Patti   Number of children: 1   Years of education: Not on file   Highest education level: Not on file  Occupational History   Occupation: retired  Tobacco Use   Smoking status: Former    Packs/day: 2.00    Years: 40.00    Additional pack years: 0.00    Total pack years: 80.00    Types: Cigarettes    Start date: 1964    Quit date: 05/06/2004    Years since quitting: 18.2    Passive exposure: Never   Smokeless tobacco: Never  Vaping Use   Vaping Use: Never used  Substance and Sexual Activity   Alcohol use: Not Currently   Drug use: No   Sexual activity: Yes    Partners: Female  Other Topics Concern   Not on file  Social History Narrative   [place] vet, he has known service related agent orange exposure.    Retired   Community education officer daily.     Social Determinants of Health   Financial Resource Strain: Low Risk  (04/22/2022)   Overall Financial Resource Strain (CARDIA)    Difficulty of Paying Living Expenses: Not hard at all  Food Insecurity: No Food Insecurity (04/22/2022)   Hunger Vital Sign    Worried About Running Out of Food in the Last Year: Never true    Ran Out of Food in the Last Year: Never true  Transportation Needs: No Transportation Needs (04/22/2022)   PRAPARE - Administrator, Civil Service (Medical): No    Lack of Transportation (Non-Medical):  No  Physical Activity: Sufficiently Active (04/22/2022)   Exercise Vital Sign    Days of Exercise per Week: 6 days    Minutes of Exercise per Session: 60 min  Stress: No Stress Concern Present (04/22/2022)   [employer]  of Occupational Health - Occupational Stress Questionnaire    Feeling of Stress : Not at all  Social Connections: Moderately Isolated (04/22/2022)   Social Connection and Isolation Panel [NHANES]    Frequency of Communication with Friends and Family: More than three times a week    Frequency of Social Gatherings with Friends and Family: Twice a week    Attends Religious Services: Never    Database administrator or Organizations: No    Attends Engineer, structural: Never    Marital Status: Married     Family History: The patient's family history includes Diabetes in his sister; Heart attack in his sister; Heart disease in his sister; Hyperlipidemia in his mother and sister; Hypertension in his mother, sister, and son; Lung cancer in his father; Stroke in his mother. There is no history of Colon cancer, Prostate cancer, Esophageal cancer, Stomach cancer, Rectal cancer, Colon polyps, or Pancreatic cancer.  ROS:   Please see the history of present illness.    (+) Shortness of breath (mainly with exertion) No syncope all other systems reviewed and are negative.  EKGs/Labs/Other Studies Reviewed:    The following studies were reviewed today:  Cardiac Studies & Procedures   CARDIAC CATHETERIZATION  CARDIAC CATHETERIZATION 07/17/2021  Narrative   Mid RCA to Dist RCA lesion is 75% stenosed.   Dist RCA lesion is 100% stenosed.   Prox LAD to Mid LAD lesion is 80% stenosed.   Mid RCA lesion is 80% stenosed.   LV end diastolic pressure is normal.  Two-vessel CAD with 80% focal proximal LAD stenosis which is unchanged from 2013 except with development of significant calcification.  Diffusely calcified RCA subtotal/total occlusion distally but with excellent  left-to-right collateralization to the PDA and PLA system.  Normal tortuous left circumflex coronary artery.  Relatively preserved LV function with LV EDP 11 mmHg.  RECOMMENDATION: The angiographic findings were reviewed with the catheterization from 2013.  The LAD stenosis is not significantly changed except is more calcified.  The RCA occlusion is new.  I suspect the recent exertional symptomatology is due to RCA occlusion with chest pain developing after walking approximately 20 minutes on a treadmill.  I discussed the findings with Dr. Anne Fu.  We will plan will be an increase medical regimen with further titration of isosorbide and possible initiation of Ranexa.   If he develops increasing symptomatology, consideration for surgical revascularization and/or orbital atherectomy to the calcified LAD stenosis can be considered.  Findings Coronary Findings Diagnostic  Dominance: Right  Left Anterior Descending Prox LAD to Mid LAD lesion is 80% stenosed.  First Diagonal Branch Vessel is small in size.  Right Coronary Artery Mid RCA lesion is 80% stenosed. Mid RCA to Dist RCA lesion is 75% stenosed. Dist RCA lesion is 100% stenosed.  Third Right Posterolateral Branch Collaterals 3rd RPL filled by collaterals from Dist Cx.  Intervention  No interventions have been documented.     ECHOCARDIOGRAM  ECHOCARDIOGRAM COMPLETE 07/17/2021  Narrative ECHOCARDIOGRAM REPORT    Patient Name:   KEONA SHEFFLER Date of Exam: 07/17/2021 Medical Rec #:  [id number]        Height:       74.0 in Accession #:    [id number]       Weight:       185.0 lb Date of Birth:  12-21-1945         BSA:          2.103 m Patient Age:  75 years         BP:           141/74 mmHg Patient Gender: M                HR:           65 bpm. Exam Location:  Inpatient  Procedure: 2D Echo  Indications:    CAD  History:        Patient has prior history of Echocardiogram examinations, most recent 07/16/2017. CAD and  Previous Myocardial Infarction, Aortic Dissection, Arrythmias:Atrial Fibrillation; Risk Factors:Diabetes, Dyslipidemia and Hypertension.  Sonographer:    Devonne Doughty Referring Phys: [phone] THOMAS A KELLY  IMPRESSIONS   1. Left ventricular ejection fraction, by estimation, is 40 to 45%. The left ventricle has mildly decreased function. The left ventricle demonstrates regional wall motion abnormalities (see scoring diagram/findings for description). Left ventricular diastolic parameters are consistent with Grade I diastolic dysfunction (impaired relaxation). 2. Right ventricular systolic function is normal. The right ventricular size is mildly enlarged. There is normal pulmonary artery systolic pressure. 3. The mitral valve is normal in structure. Trivial mitral valve regurgitation. 4. The aortic valve is tricuspid. There is mild calcification of the aortic valve. There is mild thickening of the aortic valve. Aortic valve regurgitation is not visualized. Aortic valve sclerosis is present, with no evidence of aortic valve stenosis.  Comparison(s): Prior images unable to be directly viewed, comparison made by report only. The left ventricular function is worsened. The left ventricular wall motion abnormalities are new.  FINDINGS Left Ventricle: Left ventricular ejection fraction, by estimation, is 40 to 45%. The left ventricle has mildly decreased function. The left ventricle demonstrates regional wall motion abnormalities. The left ventricular internal cavity size was normal in size. There is no left ventricular hypertrophy. Left ventricular diastolic parameters are consistent with Grade I diastolic dysfunction (impaired relaxation). Normal left ventricular filling pressure.   LV Wall Scoring: The inferior wall and posterior wall are akinetic.  Right Ventricle: The right ventricular size is mildly enlarged. No increase in right ventricular wall thickness. Right ventricular systolic function is  normal. There is normal pulmonary artery systolic pressure. The tricuspid regurgitant velocity is 2.64 m/s, and with an assumed right atrial pressure of 3 mmHg, the estimated right ventricular systolic pressure is 30.9 mmHg.  Left Atrium: Left atrial size was normal in size.  Right Atrium: Right atrial size was normal in size.  Pericardium: There is no evidence of pericardial effusion. Presence of epicardial fat layer.  Mitral Valve: The mitral valve is normal in structure. Trivial mitral valve regurgitation.  Tricuspid Valve: The tricuspid valve is normal in structure. Tricuspid valve regurgitation is trivial.  Aortic Valve: The aortic valve is tricuspid. There is mild calcification of the aortic valve. There is mild thickening of the aortic valve. Aortic valve regurgitation is not visualized. Aortic valve sclerosis is present, with no evidence of aortic valve stenosis. Aortic valve mean gradient measures 4.5 mmHg. Aortic valve peak gradient measures 8.2 mmHg. Aortic valve area, by VTI measures 1.77 cm.  Pulmonic Valve: The pulmonic valve was normal in structure. Pulmonic valve regurgitation is not visualized.  Aorta: The aortic root is normal in size and structure.  IAS/Shunts: The interatrial septum was not well visualized.  Additional Comments: A device lead is visualized in the right ventricle.   LEFT VENTRICLE PLAX 2D LVIDd:         4.30 cm   Diastology LVIDs:  3.10 cm   LV e' medial:    6.53 cm/s LV PW:         0.90 cm   LV E/e' medial:  9.8 LV IVS:        0.90 cm   LV e' lateral:   7.51 cm/s LVOT diam:     2.00 cm   LV E/e' lateral: 8.6 LV SV:         55 LV SV Index:   26 LVOT Area:     3.14 cm   RIGHT VENTRICLE RV Basal diam:  4.30 cm RV Mid diam:    4.10 cm RV S prime:     9.36 cm/s TAPSE (M-mode): 1.7 cm  LEFT ATRIUM             Index        RIGHT ATRIUM           Index LA diam:        3.20 cm 1.52 cm/m   RA Area:     22.00 cm LA Vol (A2C):   28.0 ml  13.32 ml/m  RA Volume:   70.60 ml  33.58 ml/m LA Vol (A4C):   46.7 ml 22.21 ml/m LA Biplane Vol: 40.0 ml 19.02 ml/m AORTIC VALVE                     PULMONIC VALVE AV Area (Vmax):    1.84 cm      PV Vmax:       0.79 m/s AV Area (Vmean):   1.77 cm      PV Peak grad:  2.5 mmHg AV Area (VTI):     1.77 cm AV Vmax:           143.00 cm/s AV Vmean:          100.100 cm/s AV VTI:            0.308 m AV Peak Grad:      8.2 mmHg AV Mean Grad:      4.5 mmHg LVOT Vmax:         83.90 cm/s LVOT Vmean:        56.350 cm/s LVOT VTI:          0.174 m LVOT/AV VTI ratio: 0.56  AORTA Ao Root diam: 2.80 cm Ao Asc diam:  3.30 cm  MITRAL VALVE                TRICUSPID VALVE MV Area (PHT): 2.84 cm     TR Peak grad:   27.9 mmHg MV Decel Time: 267 msec     TR Vmax:        264.00 cm/s MV E velocity: 64.30 cm/s MV A velocity: 100.00 cm/s  SHUNTS MV E/A ratio:  0.64         Systemic VTI:  0.17 m Systemic Diam: 2.00 cm  Thurmon Fair MD Electronically signed by Thurmon Fair MD Signature Date/Time: 07/17/2021/2:13:04 PM    Final              Echo 07/17/2021: IMPRESSIONS   1. Left ventricular ejection fraction, by estimation, is 40 to 45%. The  left ventricle has mildly decreased function. The left ventricle  demonstrates regional wall motion abnormalities (see scoring  diagram/findings for description). Left ventricular  diastolic parameters are consistent with Grade I diastolic dysfunction  (impaired relaxation).   2. Right ventricular systolic function is normal. The right ventricular  size is mildly enlarged. There is normal  pulmonary artery systolic  pressure.   3. The mitral valve is normal in structure. Trivial mitral valve  regurgitation.   4. The aortic valve is tricuspid. There is mild calcification of the  aortic valve. There is mild thickening of the aortic valve. Aortic valve  regurgitation is not visualized. Aortic valve sclerosis is present, with  no evidence of aortic  valve stenosis.   Comparison(s): Prior images unable to be directly viewed, comparison made  by report only. The left ventricular function is worsened. The left  ventricular wall motion abnormalities are new.   Left Cardiac Cath 07/17/2021:   Mid RCA to Dist RCA lesion is 75% stenosed.   Dist RCA lesion is 100% stenosed.   Prox LAD to Mid LAD lesion is 80% stenosed.   Mid RCA lesion is 80% stenosed.   LV end diastolic pressure is normal.  Diagnostic Dominance: Right  LE Doppler 04/12/2021: Summary:  Right: Resting right ankle-brachial index indicates moderate right lower  extremity arterial disease. The right toe-brachial index is abnormal.   Left: Resting left ankle-brachial index indicates mild left lower  extremity arterial disease. The left toe-brachial index is abnormal.   EVAR Duplex 04/12/2022: Summary:  Abdominal Aorta: Patent endovascular aneurysm repair with no evidence of endoleak.   Echocardiogram 07/16/2017: - Left ventricle: The cavity size was normal. Wall thickness was    normal. Systolic function was normal. The estimated ejection    fraction was in the range of 55% to 60%. Wall motion was normal;    there were no regional wall motion abnormalities. Doppler    parameters are consistent with abnormal left ventricular    relaxation (grade 1 diastolic dysfunction).  - Mitral valve: There was mild regurgitation.  - Pulmonary arteries: Systolic pressure was moderately increased.    PA peak pressure: 40 mm Hg (S).   Echo in 2014 showed EF of 30%   EKG:  EKG is personally reviewed and interpreted.  08/14/2022-atrial sensing ventricular pacing heart rate 72 bpm 03/19/2022: EKG was not ordered. 12/17/2021: Rate 70 bpm. ECG upright QRS lead V1 and negative QRS lead I active BS   EKG is  ordered today.  The ekg ordered today demonstrates sinus rhythm V paced 63 bpm  Recent Labs: 04/04/2022: Platelets 293.0; Pro B Natriuretic peptide (BNP) 63.0 05/27/2022: BUN 33;  Creatinine, [name] 1.00; Hemoglobin 14.3; Potassium 5.1; Sodium 140 06/10/2022: ALT 36  Recent Lipid Panel    Component Value Date/Time   CHOL 153 04/16/2022 1051   TRIG 346.0 (H) 04/16/2022 1051   HDL 34.40 (L) 04/16/2022 1051   CHOLHDL 4 04/16/2022 1051   VLDL 69.2 (H) 04/16/2022 1051   LDLCALC 74 04/13/2021 1628   LDLDIRECT 75.0 04/16/2022 1051     Risk Assessment/Calculations:              Physical Exam:    VS:  BP (!) 142/70   Pulse 72   Ht 6' (1.829 m)   Wt 185 lb 6.4 oz (84.1 kg)   SpO2 95%   BMI 25.14 kg/m     Wt Readings from Last 3 Encounters:  08/14/22 185 lb 6.4 oz (84.1 kg)  07/29/22 185 lb 9.6 oz (84.2 kg)  07/19/22 188 lb 4 oz (85.4 kg)     GEN:  Well nourished, well developed in no acute distress HEENT: Normal NECK: No JVD; No carotid bruits LYMPHATICS: No lymphadenopathy CARDIAC:  RRR, no murmurs, no rubs, gallops RESPIRATORY:  Clear to auscultation without rales, wheezing or rhonchi  ABDOMEN: Soft, non-tender, non-distended MUSCULOSKELETAL:  No edema; No deformity  SKIN: Warm and dry NEUROLOGIC:  Alert and oriented x 3 PSYCHIATRIC:  Normal affect   ASSESSMENT:    1. Chronic systolic heart failure   2. LBBB (left bundle branch block)      PLAN:    In order of problems listed above:    CAD (coronary artery disease) Prior heart catheterization in 2013 as well as 2023 resulted in medical management with distal RCA CTO with collaterals from circumflex and LAD moderate to severe disease but highly calcified.  Continuing with exercise efforts.  Stationary bicycle, treadmill.  Did have an episode where he felt fairly winded after cutting the grass.  Of course this could be multifactorial especially given the pollen count currently.  Continue to monitor.  Preoperative restratification - Note from Dr. Randie Heinzain reviewed.  He would be of moderate risk from a cardiac perspective for peripheral vascular surgery.  We discussed the risks and benefits.  He  of course is worried about losing his limb and he says he will not do that.  He is currently having a foot infection actively treated by his podiatrist.  If revascularization can help with wound healing this would be a benefit.  He and his wife present for discussion. Bike and treadmill, not out of breath. But cut grass and winded.    Complete heart block [facility](HCC) ICD/CRT with recent replacement June 2022, Dr. Graciela HusbandsKlein.  Working well.  No changes made.  Pacemaker seen on ECG.   IPF (idiopathic pulmonary fibrosis) (HCC) Pulmonary notes reviewed.  Dr. Marchelle Gearingamaswamy is following. Stable.  Stable testing.   Biventricular ICD (implantable cardioverter-defibrillator) in place Dr. Graciela HusbandsKlein monitoring.  No changes   Chronic systolic heart failure We had suggested previously Entresto as well as spironolactone as well as Jardiance.  Not able to take these medications due to side effects.  He is on valsartan.  We will continue with this.  He is also on carvedilol.  No changes made.  Isosorbide as well.   PVD -Dr. Randie Heinzain, right leg 60%, foot wound, wound care, Abx.  Discussed potential revascularization surgery as above.  He may proceed from a cardiac perspective with moderate overall risk.    Medication Adjustments/Labs and Tests Ordered: Current medicines are reviewed at length with the patient today.  Concerns regarding medicines are outlined above.  Orders Placed This Encounter  Procedures   EKG 12-Lead   No orders of the defined types were placed in this encounter.   Patient Instructions  Medication Instructions:  The current medical regimen is effective;  continue present plan and medications.  *If you need a refill on your cardiac medications before your next appointment, please call your pharmacy  Follow-Up: At [facility], you and your health needs are our priority.  As part of our continuing mission to provide you with exceptional heart care, we have created designated Provider Care  Teams.  These Care Teams include your primary Cardiologist (physician) and Advanced Practice Providers (APPs -  Physician Assistants and Nurse Practitioners) who all work together to provide you with the care you need, when you need it.  We recommend signing up for the patient portal called "MyChart".  Sign up information is provided on this After Visit Summary.  MyChart is used to connect with patients for Virtual Visits (Telemedicine).  Patients are able to view lab/test results, encounter notes, upcoming appointments, etc.  Non-urgent messages can be sent to your provider as well.  To learn more about what you can do with MyChart, go to [email or web address].    Your next appointment:   6 month(s)  Provider:   Jari Favre, PA-C, Robin Searing, NP, Jacolyn Reedy, PA-C, Eligha Bridegroom, NP, or Tereso Newcomer, PA-C      Then, Donato Schultz, MD will plan to see you again in 1 year(s).        I,Rachel Rivera,acting as a Neurosurgeon for [employer], MD.,have documented all relevant documentation on the behalf of Donato Schultz, MD,as directed by  Donato Schultz, MD while in the presence of Donato Schultz, MD.  I, Donato Schultz, MD, have reviewed all documentation for this visit. The documentation on 08/14/22 for the exam, diagnosis, procedures, and orders are all accurate and complete.   Signed, Donato Schultz, MD  08/14/2022 10:35 AM    [facility]

## 2022-08-14 NOTE — Patient Instructions (Signed)
Medication Instructions:  The current medical regimen is effective;  continue present plan and medications.  *If you need a refill on your cardiac medications before your next appointment, please call your pharmacy*  Follow-Up: At [facility], you and your health needs are our priority.  As part of our continuing mission to provide you with exceptional heart care, we have created designated Provider Care Teams.  These Care Teams include your primary Cardiologist (physician) and Advanced Practice Providers (APPs -  Physician Assistants and Nurse Practitioners) who all work together to provide you with the care you need, when you need it.  We recommend signing up for the patient portal called "MyChart".  Sign up information is provided on this After Visit Summary.  MyChart is used to connect with patients for Virtual Visits (Telemedicine).  Patients are able to view lab/test results, encounter notes, upcoming appointments, etc.  Non-urgent messages can be sent to your provider as well.   To learn more about what you can do with MyChart, go to [email or web address].com.    Your next appointment:   6 month(s)  Provider:   Tessa Conte, PA-C, Ernest Dick, NP, Michele Lenze, PA-C, Michelle Swinyer, NP, or Scott Weaver, PA-C     Then, Mark Skains, MD will plan to see you again in 1 year(s).     

## 2022-08-15 LAB — WOUND CULTURE
MICRO NUMBER:: 14794803
SPECIMEN QUALITY:: ADEQUATE

## 2022-08-16 ENCOUNTER — Other Ambulatory Visit: Payer: Self-pay

## 2022-08-16 DIAGNOSIS — I96 Gangrene, not elsewhere classified: Secondary | ICD-10-CM

## 2022-08-16 DIAGNOSIS — I70261 Atherosclerosis of native arteries of extremities with gangrene, right leg: Secondary | ICD-10-CM

## 2022-08-16 NOTE — Telephone Encounter (Signed)
Patient contacted office, ready to schedule surgery. He requested 09/06/22. Instructions provided and voiced understanding. Will also send instructions to MyChart.

## 2022-08-19 ENCOUNTER — Other Ambulatory Visit: Payer: Self-pay | Admitting: Podiatry

## 2022-08-19 MED ORDER — CIPROFLOXACIN HCL 500 MG PO TABS: 500.0000 mg | ORAL_TABLET | Freq: Two times a day (BID) | ORAL | 0 refills | Status: AC

## 2022-08-19 NOTE — Progress Notes (Signed)
Tried calling patient.  No answer.  Left voicemail  Culture results reviewed this a.m. in the office.  Prescription for ciprofloxacin 500 mg BID #20 sent to the pharmacy.  Discontinue the doxycycline that was prescribed on 08/12/2022.  Follow-up in office next scheduled appointment  Felecia Shelling, DPM [facility]  Dr. Felecia Shelling, DPM    [address] [place], [place] [address]                Office [phone]  Fax [phone]

## 2022-08-28 NOTE — Progress Notes (Signed)
Remote ICD transmission.   

## 2022-08-29 ENCOUNTER — Encounter: Payer: Self-pay | Admitting: Internal Medicine

## 2022-08-29 NOTE — Pre-Procedure Instructions (Signed)
Surgical Instructions    Your procedure is scheduled on Sep 06, 2022.  Report to [facility] Main Entrance "A" at 5:30 A.M., then check in with the Admitting office.  Call this number if you have problems the morning of surgery:  [phone]  If you have any questions prior to your surgery date call [phone]: Open Monday-Friday 8am-4pm If you experience any cold or flu symptoms such as cough, fever, chills, shortness of breath, etc. between now and your scheduled surgery, please notify us at the above number.     Remember:  Do not eat or drink after midnight the night before your surgery    Take these medicines the morning of surgery with A SIP OF WATER:  ASPIRIN   carvedilol (COREG)   ciprofloxacin (CIPRO)   doxycycline (VIBRA-TABS) omeprazole (PRILOSEC)     May take these medicines IF NEEDED:  albuterol (VENTOLIN HFA) inhaler    As of today, STOP taking any Aspirin (unless otherwise instructed by your surgeon) Aleve, Naproxen, Ibuprofen, Motrin, Advil, Goody's, BC's, all herbal medications, fish oil, and all vitamins.   WHAT DO I DO ABOUT MY DIABETES MEDICATION?   Do not take metFORMIN (GLUCOPHAGE)  the morning of surgery.  THE NIGHT BEFORE SURGERY, take 15 units of insulin glargine (LANTUS SOLOSTAR). Do not take any the morning of surgery.     DO NOT take glipiZIDE (GLUCOTROL) the evening prior to surgery or the morning of surgery.   HOW TO MANAGE YOUR DIABETES BEFORE AND AFTER SURGERY  Why is it important to control my blood sugar before and after surgery? Improving blood sugar levels before and after surgery helps healing and can limit problems. A way of improving blood sugar control is eating a healthy diet by:  Eating less sugar and carbohydrates  Increasing activity/exercise  Talking with your doctor about reaching your blood sugar goals High blood sugars (greater than 180 mg/dL) can raise your risk of infections and slow your recovery, so you will need to  focus on controlling your diabetes during the weeks before surgery. Make sure that the doctor who takes care of your diabetes knows about your planned surgery including the date and location.  How do I manage my blood sugar before surgery? Check your blood sugar at least 4 times a day, starting 2 days before surgery, to make sure that the level is not too high or low.  Check your blood sugar the morning of your surgery when you wake up and every 2 hours until you get to the Short Stay unit.  If your blood sugar is less than 70 mg/dL, you will need to treat for low blood sugar: Do not take insulin. Treat a low blood sugar (less than 70 mg/dL) with  cup of clear juice (cranberry or apple), 4 glucose tablets, OR glucose gel. Recheck blood sugar in 15 minutes after treatment (to make sure it is greater than 70 mg/dL). If your blood sugar is not greater than 70 mg/dL on recheck, call [phone] for further instructions. Report your blood sugar to the short stay nurse when you get to Short Stay.  If you are admitted to the hospital after surgery: Your blood sugar will be checked by the staff and you will probably be given insulin after surgery (instead of oral diabetes medicines) to make sure you have good blood sugar levels. The goal for blood sugar control after surgery is 80-180 mg/dL.           Do NOT Smoke (Tobacco/Vaping)  for 24 hours prior to your procedure.  If you use a CPAP at night, you may bring your mask/headgear for your overnight stay.   Contacts, glasses, piercing's, hearing aid's, dentures or partials may not be worn into surgery, please bring cases for these belongings.    For patients admitted to the hospital, discharge time will be determined by your treatment team.   Patients discharged the day of surgery will not be allowed to drive home, and someone needs to stay with them for 24 hours.  SURGICAL WAITING ROOM VISITATION Patients having surgery or a procedure may have  no more than 2 support people in the waiting area - these visitors may rotate.   Children under the age of 20 must have an adult with them who is not the patient. If the patient needs to stay at the hospital during part of their recovery, the visitor guidelines for inpatient rooms apply. Pre-op nurse will coordinate an appropriate time for 1 support person to accompany patient in pre-op.  This support person may not rotate.   Please refer to the [facility] website for the visitor guidelines for Inpatients (after your surgery is over and you are in a regular room).    Special instructions:   [place]- Preparing For Surgery  Before surgery, you can play an important role. Because skin is not sterile, your skin needs to be as free of germs as possible. You can reduce the number of germs on your skin by washing with CHG (chlorahexidine gluconate) Soap before surgery.  CHG is an antiseptic cleaner which kills germs and bonds with the skin to continue killing germs even after washing.    Oral Hygiene is also important to reduce your risk of infection.  Remember - BRUSH YOUR TEETH THE MORNING OF SURGERY WITH YOUR REGULAR TOOTHPASTE  Please do not use if you have an allergy to CHG or antibacterial soaps. If your skin becomes reddened/irritated stop using the CHG.  Do not shave (including legs and underarms) for at least 48 hours prior to first CHG shower. It is OK to shave your face.  Please follow these instructions carefully.   Shower the NIGHT BEFORE SURGERY and the MORNING OF SURGERY  If you chose to wash your hair, face, and private area, wash with your normal shampoo.  After you shampoo/soap, rinse your hair and body thoroughly to remove the shampoo.  Use CHG Soap as you would any other liquid soap. You can apply CHG directly to the skin and wash gently with a scrungie or a clean washcloth.   Apply the CHG Soap to your body ONLY FROM THE NECK DOWN.  Do not use on open wounds or open  sores. Avoid contact with your eyes, ears, mouth and genitals (private parts). Wash Face and genitals (private parts)  with your normal soap.   Wash thoroughly, paying special attention to the area where your surgery will be performed.  Thoroughly rinse your body with warm water from the neck down.  DO NOT shower/wash with your normal soap after using and rinsing off the CHG Soap.  Pat yourself dry with a CLEAN TOWEL.  Wear CLEAN PAJAMAS to bed the night before surgery  Place CLEAN SHEETS on your bed the night before your surgery  DO NOT SLEEP WITH PETS.   Day of Surgery: Take a shower with CHG soap. Do not wear jewelry or makeup Do not wear lotions, powders, perfumes/colognes, or deodorant. Do not shave 48 hours prior to surgery.  Men may shave face and neck. Do not bring valuables to the hospital.  [facility] is not responsible for any belongings or valuables. Do not wear nail polish, gel polish, artificial nails, or any other type of covering on natural nails (fingers and toes) If you have artificial nails or gel coating that need to be removed by a nail salon, please have this removed prior to surgery. Artificial nails or gel coating may interfere with anesthesia's ability to adequately monitor your vital signs.  Wear Clean/Comfortable clothing the morning of surgery Remember to brush your teeth WITH YOUR REGULAR TOOTHPASTE.   Please read over the following fact sheets that you were given.    If you received a COVID test during your pre-op visit  it is requested that you wear a mask when out in public, stay away from anyone that may not be feeling well and notify your surgeon if you develop symptoms. If you have been in contact with anyone that has tested positive in the last 10 days please notify you surgeon.

## 2022-08-29 NOTE — Progress Notes (Addendum)
[facility] sent message requesting ICD/PPM device orders 08/29/22 at 1044. Awaiting orders. [employer] called to place a page to local representative to notify of surgery 08/29/2022 at 1047. Awaiting response.  Addendum: [employer] representative called back at 1056 and was notified of surgery and surgery date.

## 2022-08-29 NOTE — Progress Notes (Signed)
PERIOPERATIVE PRESCRIPTION FOR IMPLANTED CARDIAC DEVICE PROGRAMMING  Patient Information: Name:  Luke [name]  DOB:  26-Aug-1945  MRN:  [id number]  Planned Procedure:  Right Common Femoral Endarterectomy with Right Femoral to Popliteal Artery Bypass  Surgeon:  Dr. Lemar Livings  Date of Procedure:  09/06/2022  Cautery will be used.  Position during surgery:  Supine   Device Information:  Clinic EP Physician:  Sherryl Manges, MD   Device Type:  Defibrillator Manufacturer and Phone #:  [employer]: [phone] Pacemaker Dependent?:  Yes.   Date of Last Device Check:  07/24/2022 Normal Device Function?:  Yes.    Electrophysiologist's Recommendations:  Have magnet available. Provide continuous ECG monitoring when magnet is used or reprogramming is to be performed.  Procedure should not interfere with device function.  No device programming or magnet placement needed.  Per Device Clinic Standing Orders, Wiliam Ke, RN  2:56 PM 08/29/2022

## 2022-08-30 ENCOUNTER — Encounter (HOSPITAL_COMMUNITY): Payer: Self-pay

## 2022-08-30 ENCOUNTER — Encounter (HOSPITAL_COMMUNITY)
Admission: RE | Admit: 2022-08-30 | Discharge: 2022-08-30 | Disposition: A | Discharge status: Home / Self Care | Payer: Medicare Other | Source: Ambulatory Visit | Attending: Vascular Surgery | Admitting: Vascular Surgery

## 2022-08-30 ENCOUNTER — Encounter (HOSPITAL_COMMUNITY): Payer: Self-pay | Admitting: Vascular Surgery

## 2022-08-30 ENCOUNTER — Other Ambulatory Visit: Payer: Self-pay

## 2022-08-30 VITALS — BP 160/59 | HR 71 | Temp 98.2°F | Resp 18 | Ht 74.0 in | Wt 184.1 lb

## 2022-08-30 DIAGNOSIS — E785 Hyperlipidemia, unspecified: Secondary | ICD-10-CM | POA: Insufficient documentation

## 2022-08-30 DIAGNOSIS — Z01812 Encounter for preprocedural laboratory examination: Secondary | ICD-10-CM | POA: Insufficient documentation

## 2022-08-30 DIAGNOSIS — J439 Emphysema, unspecified: Secondary | ICD-10-CM | POA: Insufficient documentation

## 2022-08-30 DIAGNOSIS — I96 Gangrene, not elsewhere classified: Secondary | ICD-10-CM

## 2022-08-30 DIAGNOSIS — I700 Atherosclerosis of aorta: Secondary | ICD-10-CM | POA: Insufficient documentation

## 2022-08-30 DIAGNOSIS — E1122 Type 2 diabetes mellitus with diabetic chronic kidney disease: Secondary | ICD-10-CM | POA: Diagnosis not present

## 2022-08-30 DIAGNOSIS — I70261 Atherosclerosis of native arteries of extremities with gangrene, right leg: Secondary | ICD-10-CM | POA: Diagnosis not present

## 2022-08-30 DIAGNOSIS — J84112 Idiopathic pulmonary fibrosis: Secondary | ICD-10-CM | POA: Insufficient documentation

## 2022-08-30 DIAGNOSIS — Z95810 Presence of automatic (implantable) cardiac defibrillator: Secondary | ICD-10-CM | POA: Diagnosis not present

## 2022-08-30 DIAGNOSIS — E1152 Type 2 diabetes mellitus with diabetic peripheral angiopathy with gangrene: Secondary | ICD-10-CM | POA: Insufficient documentation

## 2022-08-30 DIAGNOSIS — I255 Ischemic cardiomyopathy: Secondary | ICD-10-CM | POA: Insufficient documentation

## 2022-08-30 DIAGNOSIS — I2510 Atherosclerotic heart disease of native coronary artery without angina pectoris: Secondary | ICD-10-CM | POA: Insufficient documentation

## 2022-08-30 DIAGNOSIS — N183 Chronic kidney disease, stage 3 unspecified: Secondary | ICD-10-CM | POA: Insufficient documentation

## 2022-08-30 DIAGNOSIS — Z01818 Encounter for other preprocedural examination: Secondary | ICD-10-CM

## 2022-08-30 DIAGNOSIS — J479 Bronchiectasis, uncomplicated: Secondary | ICD-10-CM | POA: Insufficient documentation

## 2022-08-30 DIAGNOSIS — I129 Hypertensive chronic kidney disease with stage 1 through stage 4 chronic kidney disease, or unspecified chronic kidney disease: Secondary | ICD-10-CM | POA: Insufficient documentation

## 2022-08-30 DIAGNOSIS — Z87891 Personal history of nicotine dependence: Secondary | ICD-10-CM | POA: Diagnosis not present

## 2022-08-30 HISTORY — DX: Chronic kidney disease, unspecified: N18.9

## 2022-08-30 HISTORY — DX: Dyspnea, unspecified: R06.00

## 2022-08-30 HISTORY — DX: Idiopathic pulmonary fibrosis: J84.112

## 2022-08-30 HISTORY — DX: Gastro-esophageal reflux disease without esophagitis: K21.9

## 2022-08-30 LAB — URINALYSIS, ROUTINE W REFLEX MICROSCOPIC
Bilirubin Urine: NEGATIVE
Glucose, UA: NEGATIVE mg/dL
Hgb urine dipstick: NEGATIVE
Ketones, ur: NEGATIVE mg/dL
Leukocytes,Ua: NEGATIVE
Nitrite: NEGATIVE
Protein, ur: NEGATIVE mg/dL
Specific Gravity, Urine: 1.009 (ref 1.005–1.030)
pH: 6 (ref 5.0–8.0)

## 2022-08-30 LAB — CBC
HCT: 41.4 % (ref 39.0–52.0)
Hemoglobin: 13.8 g/dL (ref 13.0–17.0)
MCH: 32.5 pg (ref 26.0–34.0)
MCHC: 33.3 g/dL (ref 30.0–36.0)
MCV: 97.4 fL (ref 80.0–100.0)
Platelets: 359 10*3/uL (ref 150–400)
RBC: 4.25 MIL/uL (ref 4.22–5.81)
RDW: 12.9 % (ref 11.5–15.5)
WBC: 15 10*3/uL — ABNORMAL HIGH (ref 4.0–10.5)
nRBC: 0 % (ref 0.0–0.2)

## 2022-08-30 LAB — COMPREHENSIVE METABOLIC PANEL
ALT: 10 U/L (ref 0–44)
AST: 16 U/L (ref 15–41)
Albumin: 3.7 g/dL (ref 3.5–5.0)
Alkaline Phosphatase: 75 U/L (ref 38–126)
Anion gap: 10 (ref 5–15)
BUN: 17 mg/dL (ref 8–23)
CO2: 24 mmol/L (ref 22–32)
Calcium: 9.5 mg/dL (ref 8.9–10.3)
Chloride: 101 mmol/L (ref 98–111)
Creatinine, Ser: 1.1 mg/dL (ref 0.61–1.24)
GFR, Estimated: >60 mL/min (ref >60)
Glucose, Bld: 153 mg/dL — ABNORMAL HIGH (ref 70–99)
Potassium: 4.3 mmol/L (ref 3.5–5.1)
Sodium: 135 mmol/L (ref 135–145)
Total Bilirubin: 0.5 mg/dL (ref 0.3–1.2)
Total Protein: 7.2 g/dL (ref 6.5–8.1)

## 2022-08-30 LAB — TYPE AND SCREEN
ABO/RH(D): A NEG
Antibody Screen: NEGATIVE

## 2022-08-30 LAB — SURGICAL PCR SCREEN
MRSA, PCR: NEGATIVE
Staphylococcus aureus: NEGATIVE

## 2022-08-30 LAB — GLUCOSE, CAPILLARY: Glucose-Capillary: 165 mg/dL — ABNORMAL HIGH (ref 70–99)

## 2022-08-30 LAB — APTT: aPTT: 32 seconds (ref 24–36)

## 2022-08-30 LAB — PROTIME-INR
INR: 1.2 (ref 0.8–1.2)
Prothrombin Time: 14.8 seconds (ref 11.4–15.2)

## 2022-08-30 NOTE — Progress Notes (Signed)
PCP - Dr. Crawford Givens Cardiologist - Dr. Donato Schultz EP - Dr. Sherryl Manges  PPM/ICD - [employer] ICD Device Orders - Device orders placed in chart Rep Notified - RN spoke with rep 08/29/22 and notified about surgery.  Chest x-ray - n/a EKG - 08/14/2022 Stress Test - 10/09/2011 ECHO - 07/17/2021 Cardiac Cath - 07/18/2011  Sleep Study - Denies CPAP - n/a  Pt is DM2. Pt checks his blood sugar 2x/day. Normal fasting blood sugar around 110. CBG at pre-op appointment was 165. So far today pt has had two eggs, one slice of ham, and toast with peanut butter and jelly.  Last dose of GLP1 agonist- n/a GLP1 instructions: n/a  Blood Thinner Instructions: n/a Aspirin Instructions: Pt will continue his ASA through morning of surgery.  NPO after midnight  COVID TEST- n/a   Anesthesia review: Yes. Cardiac Hx, with  CKD, DM, PPM/ICD.  Patient denies shortness of breath, fever, cough and chest pain at PAT appointment. Pt endorses SOB/CP or discomfort with exertion as well as a runny nose but only in the morning. Pt denies any respiratory illness/infection in the last two months.  All instructions explained to the patient, with a verbal understanding of the material. Patient agrees to go over the instructions while at home for a better understanding. Patient also instructed to self quarantine after being tested for COVID-19. The opportunity to ask questions was provided.

## 2022-08-30 NOTE — Progress Notes (Addendum)
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 Case is posted for [phone] under general anesthesia. Procedure recommended following 05/27/22 aortogram with RLE runoff for evaluation of RLE limb threatening ischemia with ulceration.    History includes former smoker (quit 05/06/04), HTN, HLD, CAD  (Abnormal stress test 10/09/11 with evidence of previous large anteroseptal and inferoseptal infarct with severe hypokinesis, EF 30%, moderate-severe 2V CAD 10/15/11 LHC, treated medically), ischemic cardiomyopathy, left BBB with intermittent CHB (s/p dual chamber CRT-ICD St[employer] Jude 07/14/12, generator change 10/25/20), afib, AAA (s/p EVAR 12/03/11), PAD, DM2, idiopathic pulmonary fibrosis (IPF), dyspnea, pancreatitis, GERD, CKD (stage III), spinal surgery (C2-4 fusion).   He initially had preoperative cardiology APP evaluation on 06/24/12 by Eligha Bridegroom, NP. She wrote: "Preoperative Cardiovascular Risk Assessment: The patient is doing well from a cardiac perspective. Therefore, based on ACC/AHA guidelines, the patient would be at acceptable risk for the planned procedure without further cardiovascular testing. According to the Revised Cardiac Risk Index (RCRI), his Perioperative Risk of Major Cardiac Event is (%): 11. His Functional Capacity in METs is: 7.59 according to the Duke Activity Status Index (DASI)..."   Since then, he also had office visit with cardiologist Dr. Anne Fu on 08/14/22 for follow-up CAD. Although diagnosed with ischemic cardiomyopathy over 10 years ago, EF had recovered, but most  recent echo in March 2023 for dyspnea which showed LVEF down to 40-45%. He underwent cardiac catheterization 07/17/2021 showing stable 80% LAD calcified lesion, new  distal RCA CTO disease with collateral blood flow from circumflex. Medical therapy recommended, with consideration of LAD intervention should symptoms progress. He has been on Imdur. He also has an CRT ICD, last generator change in 2022.   At his April visit with Dr. Anne Fu, he reported on-going dyspnea, specifically mentioned episode while cutting the grass, but otherwise was able to continue with his treadmill and stationary bike exercises. Was also having more anxiety, having trouble sleeping, but denied any significant chest discomfort. Continued monitoring of symptoms recommended. He reviewed. Dr. Darcella Cheshire notes. He felt Mr. Khader "would be of moderate risk from a cardiac perspective for peripheral vascular surgery."    He is also followed by pulmonologist Dr. Marchelle Gearing for idiopathic pulmonary fibrosis, diagnosed around November 2022. Last chest CT and PFTs felt stable. He was tolerating prifenidone then. He continued to use exercise bike at home. He notified Dr. Marchelle Gearing of RLE vascular surgery plans and wished him well with surgery. Walking desaturation test also stable. He does not use home oxygen. Of note, patient says he has been holding pirfenidone for 5 weeks per his preference (did not think he needed with stable testing). His calculated score using ARISCAT Score of Post-Operative Pulmonary Complications, was 26, Intermediate risk, 13.3% risk of post-operative pulmonary complications.    He is aware that he has been classified as moderate/intermediate risk from cardiopulmonary risk assessments. He does have a chronic right foot wound. He has been putting surgery off, but indicated that Dr. Randie Heinz felt it may not heal without RLE revascularization. However, despite recent evaluations by Dr. Anne Fu  and Dr. Marchelle Gearing, he now tells me  that he has been having progressive exertional dyspnea and with associated chest discomfort, last on 08/24/22 when walking up an incline to get to his car. He says that he continues to be able to do daily exercise on his treadmill (30 minutes) and treadmill (20 minute at 3 mph) without chest pain or significant SOB. So far, the only activities where he has had significant exertional dyspnea and associated mid chest discomfort was when walking up an incline and when mowing his yard which he did last a week ago. Symptoms improved after resting for about 5 minutes. He denied any current SOB or chest pain. He does not have as needed Nitroglycerin, but he is on daily Imdur 30 mg. He is also on Coreg 25 mg BID, amlodipine 10 mg daily, Zocor 40 mg Q HS, valsartan 40 mg daily.   Given change in symptomology and concern for stable angina since his visit on 08/14/22 with Dr. Anne Fu, I contacted DOD Dr. Loman Brooklyn to discuss surgery plans and patient's progressive symptoms. I let him know that patient did not have Nitro at home but was taking Imdur. He is currently symptom free and did not report any unstable anginal symptoms, so Dr. Elberta Fortis advised that he should be re-evaluated in their office next week. In the interim, Mr. Shevlin was advised to avoid strenuous activity and to contact EMS for new-progressive symptoms. Wife also present. They are both comfortable with this plan.    I updated anesthesiologist Eilene Ghazi, MD. I will also send Dr. Randie Heinz an update and await cardiology recommendations. Patient is taking ASA 325 mg 1-2 tablets daily and wanted me to let Dr. Randie Heinz know. I'll also include a message regarding WBC 15.0.     VS: BP (!) 160/59   Pulse 71   Temp 36.8 C (Oral)   Resp 18   Ht 6\' 2"  (1.88 m)   Wt 83.5 kg   SpO2 98%   BMI 23.64 kg/m  Patient and provider wore face masks. ICD is located on left chest wall. Heart RRR, no murmur noted. Lungs diminished overall, but no wheezes or rhonchi noted.     PROVIDERS: [name], MD is PCP  Donato Schultz, MD is cardiologist Sherryl Manges, MD is EP cardiologist. Last visit 12/17/21. Device function normal. Mildly volume overloaded, possibly related to amlodipine. Medications adjusted for afternoon lightheadedness. 12 month follow-up planned.  Samara Deist, MD is pulmonologist Gala Lewandowsky, DPM is podiatrist   LABS: Preoperative labs noted. A1c 7.9% on 04/16/22. (all labs ordered are listed, but only abnormal results are displayed)  Labs Reviewed  GLUCOSE, CAPILLARY - Abnormal; Notable for the following components:      Result Value   Glucose-Capillary 165 (*)    All other components within normal limits  CBC - Abnormal; Notable for the following components:   WBC 15.0 (*)    All other components within normal limits  COMPREHENSIVE METABOLIC PANEL - Abnormal; Notable for the following components:   Glucose, Bld 153 (*)    All other components within normal limits  SURGICAL PCR SCREEN  PROTIME-INR  APTT  URINALYSIS, ROUTINE W REFLEX MICROSCOPIC  TYPE AND SCREEN     OTHER: PFTS 07/29/22: FVC 3.83 (83%), FEV1 2.56 (77%), FEV/FVC .67 (92%), FEF 25-75 1.38 (57%), DLCO unc/cor 11.69 (43%). Conclusion: In view of the severity of the diffusion defect, studies with exercise would be helpful to evaluate the presence of hypoxemia.  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    Simple office walk 185 feet x  3 laps goal with forehead probe 01/02/2021   08/08/2021   01/10/2022   07/29/2022    O2 used ra ra ra ra  Number laps completed 3 3 2 3   Comments about pace avg        Resting Pulse Ox/HR 97% and 71/min 98% and HR 71 98% and HR66 98% and HR 65  Final Pulse Ox/HR 95% and 94/min 91% and HR 86 93% and HR 91 92% an dHR 84  Desaturated </= 88% no no No no  Desaturated <= 3% points no Yes, 7 poins Yes 5 ponts Yes 6 potn  Got Tachycardic >/= 90/min yes no yes no  Symptoms at end of  test none none none none  Miscellaneous comments x            IMAGES: CT Chest High Resolution 03/27/22: IMPRESSION: 1. Right lower lobe lower lobe pulmonary nodules are no longer present, compatible with resolved infectious or inflammatory process. 2. Subpleural and lower lung predominant fibrotic interstitial lung disease with traction bronchiectasis and honeycomb change. No evidence of progression. Findings are consistent with UIP per consensus guidelines: Diagnosis of Idiopathic Pulmonary Fibrosis: An Official ATS/ERS/JRS/ALAT Clinical Practice Guideline. Am Rosezetta Schlatter Crit Care Med Vol 198, Iss 5, [phone], Jan 04 2017. 3. Aortic Atherosclerosis (ICD10-I70.0) and Emphysema (ICD10-J43.9).   EKG: EKG 08/14/22: Atrial sensed ventricular paced rhythm   CV: [place] EVAR 05/23/22: Summary:  Abdominal Aorta: The largest aortic diameter remains essentially unchanged  compared to prior duplex exam. Previous diameter measurement was 3.1 cm  obtained on 04/12/2021. Prior CT 08/21/2021 measured sac at 3.3 cm.      Echo 07/17/21: IMPRESSIONS   1. Left ventricular ejection fraction, by estimation, is 40 to 45%. The  left ventricle has mildly decreased function. The left ventricle  demonstrates regional wall motion abnormalities (see scoring  diagram/findings for description). Left ventricular  diastolic parameters are consistent with Grade I diastolic dysfunction  (impaired relaxation).   2. Right ventricular systolic function is normal. The right ventricular  size is mildly enlarged. There is normal pulmonary artery systolic  pressure.   3. The mitral valve is normal in structure. Trivial mitral valve  regurgitation.   4. The aortic valve is tricuspid. There is mild calcification of the  aortic valve. There is mild thickening of the aortic valve. Aortic valve  regurgitation is not visualized. Aortic valve sclerosis is present, with  no evidence of aortic valve stenosis.  - Comparison(s):  Prior images unable to be directly viewed, comparison made  by report only. The left ventricular function is worsened. The left  ventricular wall motion abnormalities are new.  - Comparison echo LVEF 55-60%, normal wall motion, grade 1 DD, mild MR, moderately increased PASP, PA peak pressure 40 mmHg   Cardiac cath 07/17/21:   Mid RCA to Dist RCA lesion is 75% stenosed.   Dist RCA lesion is 100% stenosed.   Prox LAD to Mid LAD lesion is 80% stenosed.   Mid RCA lesion is 80% stenosed.   LV end diastolic pressure is normal.   Two-vessel CAD with 80% focal proximal LAD stenosis which is unchanged from 2013 except with development of significant calcification.   Diffusely calcified RCA subtotal/total occlusion distally but with excellent left-to-right collateralization to the PDA and PLA system.   Normal tortuous left circumflex coronary artery.   Relatively preserved LV function with LV EDP 11  mmHg.   RECOMMENDATION: The angiographic findings were reviewed with the catheterization from 2013.  The LAD stenosis is not significantly changed except is more calcified.  The RCA occlusion is new.  I suspect the recent exertional symptomatology is due to RCA occlusion with chest pain developing after walking approximately 20 minutes on a treadmill.  I discussed the findings with Dr. Anne Fu.  We will plan will be an increase medical regimen with further titration of isosorbide and possible initiation of Ranexa.   If he develops increasing symptomatology, consideration for surgical revascularization and/or orbital atherectomy to the calcified LAD stenosis can be considered.    US Carotid 05/23/17: Final Interpretation:  - Right Carotid: Velocities in the right ICA are consistent with a 1-39%  stenosis. The extracranial vessels were near-normal with only minimal  wall  thickening or plaque.  - Left Carotid: Velocities in the left ICA are consistent with a 1-39%  stenosis. The extracranial vessels were  near-normal with only minimal  wall thickening or plaque.  - Vertebrals:  Both vertebral arteries were patent with antegrade flow.  - Subclavians: Normal flow hemodynamics were seen in bilateral subclavian arteries.    Past Medical History:  Diagnosis Date   AAA (abdominal aortic aneurysm) (HCC) 2011   Per vascular surgery   Anxiety    no med. in use for anxiety but pt. speaks openly of his stress & anxious feelings regarding impending surgery     Atrial fibrillation (HCC)    Automatic implantable cardioverter-defibrillator in situ    CAD (coronary artery disease)    Presumed CAD with nuclear scan October 09, 2011,  large anteroseptal MI and inferior MI. Catheterization scheduled October 15, 2011   Cardiomyopathy [facility])    Nuclear, October 09, 2011, EF 30%, multiple focal wall motion abnormalities   Diabetes mellitus    type II   Drug therapy    Hyperkalemia with spironolactone   Ejection fraction < 50%    EF 30%, nuclear, October 09, 2011   Ejection fraction < 50%    EF previously 30%  //   EF 30-35%, echo, July 14, 2012, severe diffuse hypokinesis, PA pressure 43 mm mercury   Fall due to ice or snow Feb.  19, 2015   HLD (hyperlipidemia)    Hypertension    white coat HTN-- often elevated in office and controlled on outside checks.   ICD (implantable cardioverter-defibrillator) in place    CRT-D placed March, 2014 complete heart block and k dysfunction   LBBB (left bundle branch block)    LBBB on EKG October 11, 2011,  no prior EKG has been done   Low testosterone    Hx of   Myocardial infarction (HCC)    Told in 2015-never knew-showed up on stress test for AAA;    Pacemaker    PAD (peripheral artery disease) (HCC)    Pancreatitis     Past Surgical History:  Procedure Laterality Date   ABDOMINAL AORTAGRAM N/A 10/28/2011   Procedure: ABDOMINAL Ronny Flurry;  Surgeon: [name], MD;  Location: [facility] CATH LAB;  Service: Cardiovascular;  Laterality: N/A;   ABDOMINAL AORTIC ANEURYSM  REPAIR     EVAR    ABDOMINAL AORTOGRAM N/A 05/27/2022   Procedure: ABDOMINAL AORTOGRAM;  Surgeon: Maeola Harman, MD;  Location: [facility] INVASIVE CV LAB;  Service: Cardiovascular;  Laterality: N/A;   BI-VENTRICULAR IMPLANTABLE CARDIOVERTER DEFIBRILLATOR N/A 07/16/2012   Procedure: BI-VENTRICULAR IMPLANTABLE CARDIOVERTER DEFIBRILLATOR  (CRT-D);  Surgeon: Duke Salvia, MD;  Location: [facility]  CATH LAB;  Service: Cardiovascular;  Laterality: N/A;   BIV ICD GENERATOR CHANGEOUT N/A 10/25/2020   Procedure: BIV ICD GENERATOR CHANGEOUT;  Surgeon: Duke Salvia, MD;  Location: [facility] INVASIVE CV LAB;  Service: Cardiovascular;  Laterality: N/A;   CARDIAC CATHETERIZATION     COLONOSCOPY  03/23/2018; 2020   ESOPHAGOGASTRODUODENOSCOPY N/A 02/12/2019   Procedure: ESOPHAGOGASTRODUODENOSCOPY (EGD);  Surgeon: Lynann Bologna, MD;  Location: [facility] ENDOSCOPY;  Service: Endoscopy;  Laterality: N/A;   ESOPHAGOGASTRODUODENOSCOPY (EGD) WITH PROPOFOL N/A 02/05/2018   Procedure: ESOPHAGOGASTRODUODENOSCOPY (EGD) WITH PROPOFOL;  Surgeon: Napoleon Form, MD;  Location: [facility] ENDOSCOPY;  Service: Endoscopy;  Laterality: N/A;   ESOPHAGOGASTRODUODENOSCOPY (EGD) WITH PROPOFOL N/A 09/27/2018   Procedure: ESOPHAGOGASTRODUODENOSCOPY (EGD) WITH PROPOFOL;  Surgeon: Jeani Hawking, MD;  Location: [facility] ENDOSCOPY;  Service: Endoscopy;  Laterality: N/A;   FOREIGN BODY REMOVAL  09/27/2018   Procedure: FOREIGN BODY REMOVAL;  Surgeon: Jeani Hawking, MD;  Location: [facility] ENDOSCOPY;  Service: Endoscopy;;   FOREIGN BODY REMOVAL  02/12/2019   Procedure: FOREIGN BODY REMOVAL;  Surgeon: Lynann Bologna, MD;  Location: [facility] ENDOSCOPY;  Service: Endoscopy;;   HERNIA REPAIR  Jan. 9, 2015   INSERTION OF MESH N/A 05/14/2013   Procedure: INSERTION OF MESH;  Surgeon: Ardeth Sportsman, MD;  Location: [facility] OR;  Service: General;  Laterality: N/A;   LEFT HEART CATH AND CORONARY ANGIOGRAPHY N/A 07/17/2021   Procedure: LEFT HEART CATH AND CORONARY ANGIOGRAPHY;  Surgeon: Lennette Bihari, MD;  Location: [facility] INVASIVE CV LAB;  Service: Cardiovascular;  Laterality: N/A;   LOWER EXTREMITY ANGIOGRAM Bilateral 10/28/2011   Procedure: LOWER EXTREMITY ANGIOGRAM;  Surgeon: [name], MD;  Location: [facility] CATH LAB;  Service: Cardiovascular;  Laterality: Bilateral;   LOWER EXTREMITY ANGIOGRAPHY  05/27/2022   Procedure: Lower Extremity Angiography;  Surgeon: Maeola Harman, MD;  Location: [facility] INVASIVE CV LAB;  Service: Cardiovascular;;   PACEMAKER INSERTION  07-16-12   pacemaker/defibrilator   POLYPECTOMY     POSTERIOR CERVICAL FUSION/FORAMINOTOMY N/A 06/09/2014   Procedure: Laminectomy - Cervical two-Cervcial four posterior cervical instrumented fusion Cervical two-cervical four;  Surgeon: Tia Alert, MD;  Location: [facility] NEURO ORS;  Service: Neurosurgery;  Laterality: N/A;  posterior    SAVORY DILATION N/A 02/05/2018   Procedure: SAVORY DILATION;  Surgeon: Napoleon Form, MD;  Location: [facility] ENDOSCOPY;  Service: Endoscopy;  Laterality: N/A;   TONSILLECTOMY     as a child    UMBILICAL HERNIA REPAIR N/A 05/14/2013   Procedure: LAPAROSCOPIC exploration and repair of hernia in abdominal ;  Surgeon: Ardeth Sportsman, MD;  Location: [facility] OR;  Service: General;  Laterality: N/A;   UPPER GASTROINTESTINAL ENDOSCOPY  2020    MEDICATIONS:  albuterol (VENTOLIN HFA) 108 (90 Base) MCG/ACT inhaler   amLODipine (NORVASC) 10 MG tablet   ASPIRIN EC PO   carvedilol (COREG) 25 MG tablet   Cholecalciferol (VITAMIN D3) 50 MCG (2000 UT) TABS   Cyanocobalamin (VITAMIN B-12 PO)   doxycycline (VIBRA-TABS) 100 MG tablet   ESBRIET 267 MG TABS   famotidine (PEPCID) 20 MG tablet   glipiZIDE (GLUCOTROL) 5 MG tablet   glucose blood (ACCU-CHEK AVIVA PLUS) test strip   insulin glargine (LANTUS SOLOSTAR) 100 UNIT/ML Solostar Pen   isosorbide mononitrate (IMDUR) 30 MG 24 hr tablet   metFORMIN (GLUCOPHAGE) 500 MG tablet   Multiple Vitamin (MULTIVITAMIN WITH MINERALS) TABS tablet   omeprazole  (PRILOSEC) 40 MG capsule   simvastatin (ZOCOR) 40 MG tablet   umeclidinium bromide (INCRUSE ELLIPTA) 62.5  MCG/ACT AEPB   valsartan (DIOVAN) 40 MG tablet   No current facility-administered medications for this encounter.  He is not currently taking Esbriet.   Shonna Chock, PA-C Surgical Short Stay/Anesthesiology [facility] Phone [phone] [facility] Phone [phone] 08/30/2022 6:57 PM

## 2022-09-02 ENCOUNTER — Other Ambulatory Visit: Payer: Self-pay | Admitting: Podiatry

## 2022-09-02 ENCOUNTER — Telehealth: Payer: Self-pay | Admitting: Cardiology

## 2022-09-02 ENCOUNTER — Telehealth: Payer: Self-pay | Admitting: Internal Medicine

## 2022-09-02 ENCOUNTER — Telehealth: Payer: Self-pay | Admitting: Pharmacist

## 2022-09-02 DIAGNOSIS — L97502 Non-pressure chronic ulcer of other part of unspecified foot with fat layer exposed: Secondary | ICD-10-CM

## 2022-09-02 NOTE — Telephone Encounter (Signed)
Patient saw Dr. Anne Fu on 04/10 and discussed his upcoming procedure for 05/03. Since then, he's reported an increase in his angina. Per staff message, Shonna Chock, PA with [facility] called and spoke with Dr. Elberta Fortis (DOD) on Friday 04/26 and discussed his symptoms. Dr. Elberta Fortis recommended a f/u with Dr. Anne Fu or an APP. Patient is currently scheduled to see Dr. Anne Fu on 05/08 at the [facility] office but wants to know if he should postpone his procedure for this Friday even though he was cleared on 04/10. Please advise.

## 2022-09-02 NOTE — Telephone Encounter (Signed)
Spoke with the patient and made him aware that his surgery will need to be postponed. Patient verbalized understanding.

## 2022-09-02 NOTE — Telephone Encounter (Signed)
Pt needs refill sent for albuterol (VENTOLIN HFA) 108 (90 Base) MCG/ACT inhaler [[id number]]  Pharmacy: [facility] on [address]

## 2022-09-02 NOTE — Telephone Encounter (Signed)
Received message from patient, he requests update on Incruse Ellipta ([employer]) patient assistance application. I was not aware patient had dropped off documents for this.  Obtained documents from front desk and faxed to [employer].  To complete application, a prescription for Incruse must be faxed to [employer]: [phone].  Incruse is prescribed by his pulmonologist, Dr Marchelle Gearing. Routing to pulmonogy with Rx request.

## 2022-09-03 ENCOUNTER — Other Ambulatory Visit: Payer: Self-pay

## 2022-09-03 MED ORDER — ALBUTEROL SULFATE HFA 108 (90 BASE) MCG/ACT IN AERS: 2.0000 | INHALATION_SPRAY | Freq: Four times a day (QID) | RESPIRATORY_TRACT | 6 refills | Status: DC | PRN

## 2022-09-03 NOTE — Telephone Encounter (Signed)
Spoke with patient advised refills of Albuterol inhaler have been sent to pharmacy. NFN

## 2022-09-04 ENCOUNTER — Ambulatory Visit (INDEPENDENT_AMBULATORY_CARE_PROVIDER_SITE_OTHER): Payer: Medicare Other | Admitting: Podiatry

## 2022-09-04 DIAGNOSIS — L97502 Non-pressure chronic ulcer of other part of unspecified foot with fat layer exposed: Secondary | ICD-10-CM | POA: Diagnosis not present

## 2022-09-04 DIAGNOSIS — E0843 Diabetes mellitus due to underlying condition with diabetic autonomic (poly)neuropathy: Secondary | ICD-10-CM

## 2022-09-04 MED ORDER — CIPROFLOXACIN HCL 500 MG PO TABS: 500.0000 mg | ORAL_TABLET | Freq: Two times a day (BID) | ORAL | 0 refills | Status: DC

## 2022-09-04 NOTE — Progress Notes (Signed)
Chief Complaint  Patient presents with   Foot Ulcer    Patient came in today for right foot ulcer follow-up, patient has some pain, rate of pain 4 out of 10, no drainage, TX: Cipro    Subjective:  77 y.o. male with PMHx of diabetes mellitus presenting today for recurrence of the ulcer to the plantar aspect of the first MTP of the right foot.  Patient states that he has been dealing with a callus/ulcer to the area since 2018.    We have discussed surgery in the past and currently the patient is working closely with vascular to optimize circulation prior to any surgical intervention.  Patient had an acute flareup of redness with swelling to the great toe joint last visit.  He recently completed the ciprofloxacin 500 mg BID and says that it is significantly improved with the antibiotics.   Past Medical History:  Diagnosis Date   AAA (abdominal aortic aneurysm) (HCC) 2011   Per vascular surgery   Anginal pain (HCC) 2024   With exertion   Atrial fibrillation (HCC)    Automatic implantable cardioverter-defibrillator in situ    CAD (coronary artery disease)    Presumed CAD with nuclear scan October 09, 2011,  large anteroseptal MI and inferior MI. Catheterization scheduled October 15, 2011   Cardiomyopathy [facility])    Nuclear, October 09, 2011, EF 30%, multiple focal wall motion abnormalities   Chronic kidney disease    CKD3   Diabetes mellitus    type II   Drug therapy    Hyperkalemia with spironolactone   Dyspnea    Ejection fraction < 50%    EF 30%, nuclear, October 09, 2011   Ejection fraction < 50%    EF previously 30%  //   EF 30-35%, echo, July 14, 2012, severe diffuse hypokinesis, PA pressure 43 mm mercury   Fall due to ice or snow 06/24/2013   GERD (gastroesophageal reflux disease)    Silent   HLD (hyperlipidemia)    Hypertension    white coat HTN-- often elevated in office and controlled on outside checks.   ICD (implantable cardioverter-defibrillator) in place    CRT-D placed March,  2014 complete heart block and k dysfunction   IPF (idiopathic pulmonary fibrosis) (HCC)    LBBB (left bundle branch block)    LBBB on EKG October 11, 2011,  no prior EKG has been done   Low testosterone    Hx of   Myocardial infarction (HCC)    Told in 2015-never knew-showed up on stress test for AAA;    Pacemaker    PAD (peripheral artery disease) (HCC)    Pancreatitis     Past Surgical History:  Procedure Laterality Date   ABDOMINAL AORTAGRAM N/A 10/28/2011   Procedure: ABDOMINAL Ronny Flurry;  Surgeon: [name], MD;  Location: [facility] CATH LAB;  Service: Cardiovascular;  Laterality: N/A;   ABDOMINAL AORTIC ANEURYSM REPAIR     EVAR    ABDOMINAL AORTOGRAM N/A 05/27/2022   Procedure: ABDOMINAL AORTOGRAM;  Surgeon: Maeola Harman, MD;  Location: [facility] INVASIVE CV LAB;  Service: Cardiovascular;  Laterality: N/A;   BI-VENTRICULAR IMPLANTABLE CARDIOVERTER DEFIBRILLATOR N/A 07/16/2012   Procedure: BI-VENTRICULAR IMPLANTABLE CARDIOVERTER DEFIBRILLATOR  (CRT-D);  Surgeon: Duke Salvia, MD;  Location: [facility] CATH LAB;  Service: Cardiovascular;  Laterality: N/A;   BIV ICD GENERATOR CHANGEOUT N/A 10/25/2020   Procedure: BIV ICD GENERATOR CHANGEOUT;  Surgeon: Duke Salvia, MD;  Location: [facility] INVASIVE CV LAB;  Service: Cardiovascular;  Laterality: N/A;   CARDIAC CATHETERIZATION     CATARACT EXTRACTION Left 2023   COLONOSCOPY  03/23/2018; 2020   ESOPHAGOGASTRODUODENOSCOPY N/A 02/12/2019   Procedure: ESOPHAGOGASTRODUODENOSCOPY (EGD);  Surgeon: Lynann Bologna, MD;  Location: [facility] ENDOSCOPY;  Service: Endoscopy;  Laterality: N/A;   ESOPHAGOGASTRODUODENOSCOPY (EGD) WITH PROPOFOL N/A 02/05/2018   Procedure: ESOPHAGOGASTRODUODENOSCOPY (EGD) WITH PROPOFOL;  Surgeon: Napoleon Form, MD;  Location: [facility] ENDOSCOPY;  Service: Endoscopy;  Laterality: N/A;   ESOPHAGOGASTRODUODENOSCOPY (EGD) WITH PROPOFOL N/A 09/27/2018   Procedure: ESOPHAGOGASTRODUODENOSCOPY (EGD) WITH PROPOFOL;  Surgeon: Jeani Hawking, MD;  Location: [facility] ENDOSCOPY;  Service: Endoscopy;  Laterality: N/A;   FOREIGN BODY REMOVAL  09/27/2018   Procedure: FOREIGN BODY REMOVAL;  Surgeon: Jeani Hawking, MD;  Location: [facility] ENDOSCOPY;  Service: Endoscopy;;   FOREIGN BODY REMOVAL  02/12/2019   Procedure: FOREIGN BODY REMOVAL;  Surgeon: Lynann Bologna, MD;  Location: [facility] ENDOSCOPY;  Service: Endoscopy;;   HERNIA REPAIR  05/14/2013   INSERTION OF MESH N/A 05/14/2013   Procedure: INSERTION OF MESH;  Surgeon: Ardeth Sportsman, MD;  Location: [facility] OR;  Service: General;  Laterality: N/A;   LEFT HEART CATH AND CORONARY ANGIOGRAPHY N/A 07/17/2021   Procedure: LEFT HEART CATH AND CORONARY ANGIOGRAPHY;  Surgeon: Lennette Bihari, MD;  Location: [facility] INVASIVE CV LAB;  Service: Cardiovascular;  Laterality: N/A;   LOWER EXTREMITY ANGIOGRAM Bilateral 10/28/2011   Procedure: LOWER EXTREMITY ANGIOGRAM;  Surgeon: [name], MD;  Location: [facility] CATH LAB;  Service: Cardiovascular;  Laterality: Bilateral;   LOWER EXTREMITY ANGIOGRAPHY  05/27/2022   Procedure: Lower Extremity Angiography;  Surgeon: Maeola Harman, MD;  Location: [facility] INVASIVE CV LAB;  Service: Cardiovascular;;   PACEMAKER INSERTION  07/16/2012   pacemaker/defibrilator   POLYPECTOMY     POSTERIOR CERVICAL FUSION/FORAMINOTOMY N/A 06/09/2014   Procedure: Laminectomy - Cervical two-Cervcial four posterior cervical instrumented fusion Cervical two-cervical four;  Surgeon: Tia Alert, MD;  Location: [facility] NEURO ORS;  Service: Neurosurgery;  Laterality: N/A;  posterior    SAVORY DILATION N/A 02/05/2018   Procedure: SAVORY DILATION;  Surgeon: Napoleon Form, MD;  Location: [facility] ENDOSCOPY;  Service: Endoscopy;  Laterality: N/A;   TONSILLECTOMY     as a child    UMBILICAL HERNIA REPAIR N/A 05/14/2013   Procedure: LAPAROSCOPIC exploration and repair of hernia in abdominal ;  Surgeon: Ardeth Sportsman, MD;  Location: [facility] OR;  Service: General;  Laterality: N/A;   UPPER  GASTROINTESTINAL ENDOSCOPY  2020    Allergies  Allergen Reactions   Aldactone [Spironolactone] Other (See Comments)    Hyperkalemia   Codeine Rash and Hives   Atorvastatin     Myalgias with lipitor.  Does tolerate simvastatin.     Januvia [Sitagliptin] Other (See Comments)    Diarrhea and heart racing   Jardiance [Empagliflozin] Other (See Comments)    Polyuria; excessive weight loss   Lisinopril     Possible cause of pancreatitis    RT foot 07/05/2022   Objective/Physical Exam General: The patient is alert and oriented x3 in no acute distress.  Dermatology:  Ulcer to the plantar aspect of the first MTP right foot appears stable.  Ulcer to the plantar aspect of the foot measures approximately 1.0 x 1.0 x 0.4 cm.  Periwound is callused.  There is no exposed bone muscle tendon ligament or joint.  Fibrotic wound base mixed with some granular tissue.  Vascular: Healthy adequate bleeding upon debridement of the ulcer.  Lower extremity angiography 05/27/2022 performed by Dr.  Lemar Livings, vein and vascular.  Please see report.  Being closely monitored with vascular  Neurological: Light touch and protective threshold diminished bilaterally.   Musculoskeletal Exam: No pedal deformity.  No prior amputations.  Muscle strength 5/5 all compartments  Radiographic exam RT foot 08/12/2022: Mostly unchanged from prior x-rays.  Stable.  No radiolucency or gas within the tissues and does not present on prior x-rays.  No radiographic evidence of obvious osseous erosions or signs of osteomyelitis  Assessment: 1.  Ulcer plantar aspect of the first MTP right secondary to diabetes mellitus 2. diabetes mellitus w/ peripheral neuropathy 3.   Moderate cellulitis right foot; improved    Plan of Care:  -Patient evaluated.  Medically necessary excisional debridement including subcutaneous tissue was performed using a tissue nipper.  Excisional debridement of the necrotic nonviable tissue down to healthier  bleeding viable tissue was performed with postdebridement measurement same as pre- - Overall significant reduction of the erythema and edema around the great toe joint.  Refill prescription for ciprofloxacin 500 mg BID x 10 days - Continue Betadine ointment and a light dressing -Continue offloading felt dancers pads that were applied to the insoles in the shoes to offload pressure from the foot and wound - Again surgery was discussed which would include a fibular sesamoidectomy.  Prior to this he does need vascular clearance and he is also being managed with cardiology.  Clearance and optimization from a cardiology and vascular standpoint will be needed prior to surgery -Return to clinic after the patient has completed management with both specialties   Felecia Shelling, DPM [facility]  Dr. Felecia Shelling, DPM    [address] [place], [place] [address]                Office [phone]  Fax [phone]

## 2022-09-05 NOTE — Telephone Encounter (Signed)
Dr. Marchelle Gearing, Patient was taking Spiriva on his last office visit on 07/29/2022.  Our office is being asked for a script for Incruse Ellipta for patient assistance.  Please advise.  Thank you.

## 2022-09-05 NOTE — Telephone Encounter (Signed)
Ok for [employer]

## 2022-09-06 MED ORDER — INCRUSE ELLIPTA 62.5 MCG/ACT IN AEPB: 1.0000 | INHALATION_SPRAY | Freq: Every day | RESPIRATORY_TRACT | 3 refills | Status: DC

## 2022-09-06 NOTE — Telephone Encounter (Signed)
RX for Incruse has been printed. Will have MR to sign on Monday and fax to [employer].

## 2022-09-11 ENCOUNTER — Encounter (HOSPITAL_BASED_OUTPATIENT_CLINIC_OR_DEPARTMENT_OTHER): Payer: Self-pay | Admitting: Cardiology

## 2022-09-11 ENCOUNTER — Ambulatory Visit (INDEPENDENT_AMBULATORY_CARE_PROVIDER_SITE_OTHER): Payer: Medicare Other | Admitting: Cardiology

## 2022-09-11 ENCOUNTER — Other Ambulatory Visit: Payer: Self-pay | Admitting: Cardiology

## 2022-09-11 ENCOUNTER — Ambulatory Visit: Payer: Medicare Other | Admitting: Podiatry

## 2022-09-11 VITALS — BP 138/86 | HR 71 | Ht 74.0 in | Wt 186.4 lb

## 2022-09-11 DIAGNOSIS — H43811 Vitreous degeneration, right eye: Secondary | ICD-10-CM | POA: Diagnosis not present

## 2022-09-11 DIAGNOSIS — I442 Atrioventricular block, complete: Secondary | ICD-10-CM

## 2022-09-11 DIAGNOSIS — I5022 Chronic systolic (congestive) heart failure: Secondary | ICD-10-CM | POA: Diagnosis not present

## 2022-09-11 DIAGNOSIS — I25112 Atherosclerotic heart disease of native coronary artery with refractory angina pectoris: Secondary | ICD-10-CM

## 2022-09-11 DIAGNOSIS — H35373 Puckering of macula, bilateral: Secondary | ICD-10-CM | POA: Diagnosis not present

## 2022-09-11 DIAGNOSIS — J84112 Idiopathic pulmonary fibrosis: Secondary | ICD-10-CM | POA: Diagnosis not present

## 2022-09-11 DIAGNOSIS — H2511 Age-related nuclear cataract, right eye: Secondary | ICD-10-CM | POA: Diagnosis not present

## 2022-09-11 DIAGNOSIS — I447 Left bundle-branch block, unspecified: Secondary | ICD-10-CM

## 2022-09-11 DIAGNOSIS — E113591 Type 2 diabetes mellitus with proliferative diabetic retinopathy without macular edema, right eye: Secondary | ICD-10-CM | POA: Diagnosis not present

## 2022-09-11 DIAGNOSIS — H4311 Vitreous hemorrhage, right eye: Secondary | ICD-10-CM | POA: Diagnosis not present

## 2022-09-11 DIAGNOSIS — E113592 Type 2 diabetes mellitus with proliferative diabetic retinopathy without macular edema, left eye: Secondary | ICD-10-CM | POA: Diagnosis not present

## 2022-09-11 NOTE — Patient Instructions (Addendum)
Medication Instructions:  Your physician recommends that you continue on your current medications as directed. Please refer to the Current Medication list given to you today.  *If you need a refill on your cardiac medications before your next appointment, please call your pharmacy*   Testing/Procedures: You are scheduled for a Cardiac Catheterization on Wednesday, May 15 with Dr. Lance Muss.  1. Please arrive at the [facility] (Main Entrance A) at [facility]: [address] [place], [place] [address] at 8:00 AM (This time is 2 hour(s) before your procedure to ensure your preparation). Free valet parking service is available. You will check in at ADMITTING. The support person will be asked to wait in the waiting room.  It is OK to have someone drop you off and come back when you are ready to be discharged.    Special note: Every effort is made to have your procedure done on time. Please understand that emergencies sometimes delay scheduled procedures.  2. Diet: Do not eat solid foods after midnight.  The patient may have clear liquids until 5am upon the day of the procedure.  3. Labs: Labs done at [facility] 4/26  4. Medication instructions in preparation for your procedure:  Take only 1/2 dose  units of insulin the night before your procedure. Do not take any insulin on the day of the procedure.  Do not take Diabetes Med Glucophage (Metformin) on the day of the procedure and HOLD 48 HOURS AFTER THE PROCEDURE.  Hold Glipizide morning of procedure   On the morning of your procedure, take your Aspirin 81 mg and any morning medicines NOT listed above.  You may use sips of water.  5. Plan to go home the same day, you will only stay overnight if medically necessary. 6. Bring a current list of your medications and current insurance cards. 7. You MUST have a responsible person to drive you home. 8. Someone MUST be with you the first 24 hours after you arrive home or your discharge will  be delayed. 9. Please wear clothes that are easy to get on and off and wear slip-on shoes.  Thank you for allowing [place] to care for you!   -- [place] [facility]   Follow-Up: At [facility], you and your health needs are our priority.  As part of our continuing mission to provide you with exceptional heart care, we have created designated Provider Care Teams.  These Care Teams include your primary Cardiologist (physician) and Advanced Practice Providers (APPs -  Physician Assistants and Nurse Practitioners) who all work together to provide you with the care you need, when you need it.  We recommend signing up for the patient portal called "MyChart".  Sign up information is provided on this After Visit Summary.  MyChart is used to connect with patients for Virtual Visits (Telemedicine).  Patients are able to view lab/test results, encounter notes, upcoming appointments, etc.  Non-urgent messages can be sent to your provider as well.   To learn more about what you can do with MyChart, go to [email or web address].    Your next appointment:   2 week(s) post cath   Provider:   Donato Schultz, MD  or APP

## 2022-09-11 NOTE — H&P (View-Only) (Signed)
Cardiology Office Note:    Date:  09/11/2022   ID:  Luke [name], DOB 06/04/1945, MRN [id number]  PCP:  Duncan, Graham S, MD   [facility] Providers Cardiologist:  Aylani Spurlock, MD     Referring MD: Duncan, Graham S, MD    History of Present Illness:    Luke [name] is a 76 y.o. male here for for follow-up of coronary artery disease, cardiac catheterization 07/17/2021 80% LAD calcified lesion treated medically, distal RCA CTO disease with collateral blood flow from circumflex, EF 40 to 45%, CRT ICD.  LHC 2013 - 2V CAD Left anterior descending (LAD): focal 70-80% proximal. Long 40% mid vessel. Right coronary artery (RCA): Diffusely diseased. 50-60% mid vessel, 70% distal, 70% at bifurcation of PDA/PLOM.   During a prior visit on 01/01/2021 ischemic cardiomyopathy left bundle branch block CRT ICD Dr. Klein after an intermittent complete heart block.  He goes by [name].  He is a former Dr. Katz patient.  No previous mode switches, normal device function.  He is a [place] vet.    Stage 3a kidney disease. He is currently taking low dose valsartan. He takes his medication at night time because if he takes his medication during the day he feels unwell and has trouble with his balance.  He tried to take Jardiance but this caused significant diarrhea.  He experiences shortness of breath or windedness, mainly with exertion. Has been diagnosed with IPF but his lung findings are stable he states.  Discussed revascularization of right limb with Dr. Cain.  Discussed some shortness of breath with cutting the grass.  Wife present.  He called back to the office and stated that he was having some increasing anginal type symptoms.  Past Medical History:  Diagnosis Date   AAA (abdominal aortic aneurysm) (HCC) 2011   Per vascular surgery   Anginal pain (HCC) 2024   With exertion   Atrial fibrillation (HCC)    Automatic implantable cardioverter-defibrillator in situ    CAD (coronary artery  disease)    Presumed CAD with nuclear scan October 09, 2011,  large anteroseptal MI and inferior MI. Catheterization scheduled October 15, 2011   Cardiomyopathy (HCC)    Nuclear, October 09, 2011, EF 30%, multiple focal wall motion abnormalities   Chronic kidney disease    CKD3   Diabetes mellitus    type II   Drug therapy    Hyperkalemia with spironolactone   Dyspnea    Ejection fraction < 50%    EF 30%, nuclear, October 09, 2011   Ejection fraction < 50%    EF previously 30%  //   EF 30-35%, echo, July 14, 2012, severe diffuse hypokinesis, PA pressure 43 mm mercury   Fall due to ice or snow 06/24/2013   GERD (gastroesophageal reflux disease)    Silent   HLD (hyperlipidemia)    Hypertension    white coat HTN-- often elevated in office and controlled on outside checks.   ICD (implantable cardioverter-defibrillator) in place    CRT-D placed March, 2014 complete heart block and k dysfunction   IPF (idiopathic pulmonary fibrosis) (HCC)    LBBB (left bundle branch block)    LBBB on EKG October 11, 2011,  no prior EKG has been done   Low testosterone    Hx of   Myocardial infarction (HCC)    Told in 2015-never knew-showed up on stress test for AAA;    Pacemaker    PAD (peripheral artery disease) (HCC)      Pancreatitis     Past Surgical History:  Procedure Laterality Date   ABDOMINAL AORTAGRAM N/A 10/28/2011   Procedure: ABDOMINAL AORTAGRAM;  Surgeon: Christopher S Dickson, MD;  Location: [facility];  Service: Cardiovascular;  Laterality: N/A;   ABDOMINAL AORTIC ANEURYSM REPAIR     EVAR    ABDOMINAL AORTOGRAM N/A 05/27/2022   Procedure: ABDOMINAL AORTOGRAM;  Surgeon: Cain, Brandon Christopher, MD;  Location: [facility];  Service: Cardiovascular;  Laterality: N/A;   BI-VENTRICULAR IMPLANTABLE CARDIOVERTER DEFIBRILLATOR N/A 07/16/2012   Procedure: BI-VENTRICULAR IMPLANTABLE CARDIOVERTER DEFIBRILLATOR  (CRT-D);  Surgeon: Steven C Klein, MD;  Location: [facility];  Service: Cardiovascular;   Laterality: N/A;   BIV ICD GENERATOR CHANGEOUT N/A 10/25/2020   Procedure: BIV ICD GENERATOR CHANGEOUT;  Surgeon: Klein, Steven C, MD;  [facility]: [facility];  Service: Cardiovascular;  Laterality: N/A;   CARDIAC CATHETERIZATION     CATARACT EXTRACTION Left 2023   COLONOSCOPY  03/23/2018; 2020   ESOPHAGOGASTRODUODENOSCOPY N/A 02/12/2019   Procedure: ESOPHAGOGASTRODUODENOSCOPY (EGD);  Surgeon: Gupta, Rajesh, MD;  Location: [facility];  Service: Endoscopy;  Laterality: N/A;   ESOPHAGOGASTRODUODENOSCOPY (EGD) WITH PROPOFOL N/A 02/05/2018   Procedure: ESOPHAGOGASTRODUODENOSCOPY (EGD) WITH PROPOFOL;  Surgeon: Nandigam, Kavitha V, MD;  Location: [facility];  Service: Endoscopy;  Laterality: N/A;   ESOPHAGOGASTRODUODENOSCOPY (EGD) WITH PROPOFOL N/A 09/27/2018   Procedure: ESOPHAGOGASTRODUODENOSCOPY (EGD) WITH PROPOFOL;  Surgeon: Hung, Patrick, MD;  Location: [facility];  Service: Endoscopy;  Laterality: N/A;   FOREIGN BODY REMOVAL  09/27/2018   Procedure: FOREIGN BODY REMOVAL;  Surgeon: Hung, Patrick, MD;  Location: [facility];  Service: Endoscopy;;   FOREIGN BODY REMOVAL  02/12/2019   Procedure: FOREIGN BODY REMOVAL;  Surgeon: Gupta, Rajesh, MD;  [facility];  Service: Endoscopy;;   HERNIA REPAIR  05/14/2013   INSERTION OF MESH N/A 05/14/2013   Procedure: INSERTION OF MESH;  Surgeon: Steven C. Gross, MD;  Location: [facility];  Service: General;  Laterality: N/A;   LEFT HEART CATH AND CORONARY ANGIOGRAPHY N/A 07/17/2021   Procedure: LEFT HEART CATH AND CORONARY ANGIOGRAPHY;  Surgeon: Kelly, Thomas A, MD;  [facility];  Service: Cardiovascular;  Laterality: N/A;   LOWER EXTREMITY ANGIOGRAM Bilateral 10/28/2011   Procedure: LOWER EXTREMITY ANGIOGRAM;  Surgeon: Christopher S Dickson, MD;  [facility];  Service: Cardiovascular;  Laterality: Bilateral;   LOWER EXTREMITY ANGIOGRAPHY  05/27/2022   Procedure: Lower Extremity Angiography;  Surgeon: Cain,  Brandon Christopher, MD;  [facility];  Service: Cardiovascular;;   PACEMAKER INSERTION  07/16/2012   pacemaker/defibrilator   POLYPECTOMY     POSTERIOR CERVICAL FUSION/FORAMINOTOMY N/A 06/09/2014   Procedure: Laminectomy - Cervical two-Cervcial four posterior cervical instrumented fusion Cervical two-cervical four;  Surgeon: David S Jones, MD;  Location: [facility];  Service: Neurosurgery;  Laterality: N/A;  posterior    SAVORY DILATION N/A 02/05/2018   Procedure: SAVORY DILATION;  Surgeon: Nandigam, Kavitha V, MD;  Location: [facility];  Service: Endoscopy;  Laterality: N/A;   TONSILLECTOMY     as a child    UMBILICAL HERNIA REPAIR N/A 05/14/2013   Procedure: LAPAROSCOPIC exploration and repair of hernia in abdominal ;  Surgeon: Steven C. Gross, MD;  Location: [facility];  Service: General;  Laterality: N/A;   UPPER GASTROINTESTINAL ENDOSCOPY  2020    Current Medications: Current Meds  Medication Sig   albuterol (VENTOLIN HFA) 108 (90 Base) MCG/ACT inhaler Inhale 2 puffs into the lungs every 6 (six) hours   as needed for wheezing or shortness of breath.   amLODipine (NORVASC) 10 MG tablet TAKE 1 TABLET(10 MG) BY MOUTH DAILY (Patient taking differently: Take 10 mg by mouth daily with supper.)   ASPIRIN EC PO Take 650 mg by mouth in the morning and at bedtime.   carvedilol (COREG) 25 MG tablet TAKE 1 TABLET BY MOUTH TWICE DAILY WITH MEALS   Cholecalciferol (VITAMIN D3) 50 MCG (2000 UT) TABS Take 2,000 Units by mouth in the morning.   ciprofloxacin (CIPRO) 500 MG tablet Take 1 tablet (500 mg total) by mouth 2 (two) times daily for 10 days.   Cyanocobalamin (VITAMIN B-12 PO) Take 2,000 mcg by mouth in the morning.   doxycycline (VIBRA-TABS) 100 MG tablet Take 1 tablet (100 mg total) by mouth 2 (two) times daily.   ESBRIET 267 MG TABS Take 2 tablets (534 mg total) by mouth in the morning, at noon, and at bedtime. **NOTE LOW DOSE AS MAINTENANCE**   famotidine (PEPCID) 20 MG  tablet Take 1 tablet (20 mg total) by mouth at bedtime.   glipiZIDE (GLUCOTROL) 5 MG tablet TAKE 1 TABLET(5 MG) BY MOUTH TWICE DAILY BEFORE A MEAL   glucose blood (ACCU-CHEK AVIVA PLUS) test strip AS DIRECTED TO CHECK BLOOD SUGAR TWICE DAILY   insulin glargine (LANTUS SOLOSTAR) 100 UNIT/ML Solostar Pen INJECT 0.3 TO 0.35 MLS(30 TO 35 UNITS) INTO THE SKIN EVERY DAY (Patient taking differently: Inject 30 Units into the skin at bedtime.)   isosorbide mononitrate (IMDUR) 30 MG 24 hr tablet Take 1 tablet (30 mg total) by mouth daily. (Patient taking differently: Take 30 mg by mouth daily with supper.)   metFORMIN (GLUCOPHAGE) 500 MG tablet TAKE 2 TABLETS BY MOUTH TWICE DAILY WITH FOOD   Multiple Vitamin (MULTIVITAMIN WITH MINERALS) TABS tablet Take 1 tablet by mouth in the morning.   omeprazole (PRILOSEC) 40 MG capsule TAKE 1 CAPSULE(40 MG) BY MOUTH DAILY   simvastatin (ZOCOR) 40 MG tablet TAKE 1 TABLET BY MOUTH AT BEDTIME   umeclidinium bromide (INCRUSE ELLIPTA) 62.5 MCG/ACT AEPB Inhale 1 puff into the lungs daily.   valsartan (DIOVAN) 40 MG tablet Take 20 mg by mouth daily with supper.     Allergies:   Aldactone [spironolactone], Codeine, Atorvastatin, Januvia [sitagliptin], Jardiance [empagliflozin], and Lisinopril   Social History   Socioeconomic History   Marital status: Married    Spouse name: Patti   Number of children: 1   Years of education: Not on file   Highest education level: Not on file  Occupational History   Occupation: retired  Tobacco Use   Smoking status: Former    Packs/day: 2.00    Years: 40.00    Additional pack years: 0.00    Total pack years: 80.00    Types: Cigarettes    Start date: 1964    Quit date: 05/06/2004    Years since quitting: 18.3    Passive exposure: Never   Smokeless tobacco: Never  Vaping Use   Vaping Use: Never used  Substance and Sexual Activity   Alcohol use: Not Currently   Drug use: No   Sexual activity: Yes    Partners: Female  Other  Topics Concern   Not on file  Social History Narrative   [place] vet, he has known service related agent orange exposure.    Retired   Exercises daily.     Social Determinants of Health   Financial Resource Strain: Low Risk  (04/22/2022)   Overall Financial Resource Strain (CARDIA)      Difficulty of Paying Living Expenses: Not hard at all  Food Insecurity: No Food Insecurity (04/22/2022)   Hunger Vital Sign    Worried About Running Out of Food in the Last Year: Never true    Ran Out of Food in the Last Year: Never true  Transportation Needs: No Transportation Needs (04/22/2022)   PRAPARE - Transportation    Lack of Transportation (Medical): No    Lack of Transportation (Non-Medical): No  Physical Activity: Sufficiently Active (04/22/2022)   Exercise Vital Sign    Days of Exercise per Week: 6 days    Minutes of Exercise per Session: 60 min  Stress: No Stress Concern Present (04/22/2022)   [employer] - Occupational Stress Questionnaire    Feeling of Stress : Not at all  Social Connections: Moderately Isolated (04/22/2022)   Social Connection and Isolation Panel [NHANES]    Frequency of Communication with Friends and Family: More than three times a week    Frequency of Social Gatherings with Friends and Family: Twice a week    Attends Religious Services: Never    Active Member of Clubs or Organizations: No    Attends Club or Organization Meetings: Never    Marital Status: Married     Family History: The patient's family history includes Diabetes in his sister; Heart attack in his sister; Heart disease in his sister; Hyperlipidemia in his mother and sister; Hypertension in his mother, sister, and son; Lung cancer in his father; Stroke in his mother. There is no history of Colon cancer, Prostate cancer, Esophageal cancer, Stomach cancer, Rectal cancer, Colon polyps, or Pancreatic cancer.  ROS:   Please see the history of present illness.    (+)  Shortness of breath (mainly with exertion) No syncope all other systems reviewed and are negative.  EKGs/Labs/Other Studies Reviewed:    The following studies were reviewed today:  Cardiac Studies & Procedures   CARDIAC CATHETERIZATION  CARDIAC CATHETERIZATION 07/17/2021  Narrative   Mid RCA to Dist RCA lesion is 75% stenosed.   Dist RCA lesion is 100% stenosed.   Prox LAD to Mid LAD lesion is 80% stenosed.   Mid RCA lesion is 80% stenosed.   LV end diastolic pressure is normal.  Two-vessel CAD with 80% focal proximal LAD stenosis which is unchanged from 2013 except with development of significant calcification.  Diffusely calcified RCA subtotal/total occlusion distally but with excellent left-to-right collateralization to the PDA and PLA system.  Normal tortuous left circumflex coronary artery.  Relatively preserved LV function with LV EDP 11 mmHg.  RECOMMENDATION: The angiographic findings were reviewed with the catheterization from 2013.  The LAD stenosis is not significantly changed except is more calcified.  The RCA occlusion is new.  I suspect the recent exertional symptomatology is due to RCA occlusion with chest pain developing after walking approximately 20 minutes on a treadmill.  I discussed the findings with Dr. Amera Banos.  We will plan will be an increase medical regimen with further titration of isosorbide and possible initiation of Ranexa.   If he develops increasing symptomatology, consideration for surgical revascularization and/or orbital atherectomy to the calcified LAD stenosis can be considered.  Findings Coronary Findings Diagnostic  Dominance: Right  Left Anterior Descending Prox LAD to Mid LAD lesion is 80% stenosed.  First Diagonal Branch Vessel is small in size.  Right Coronary Artery Mid RCA lesion is 80% stenosed. Mid RCA to Dist RCA lesion is 75% stenosed. Dist RCA lesion is 100% stenosed.  Third   Right Posterolateral Branch Collaterals 3rd RPL  filled by collaterals from Dist Cx.  Intervention  No interventions have been documented.     ECHOCARDIOGRAM  ECHOCARDIOGRAM COMPLETE 07/17/2021  Narrative ECHOCARDIOGRAM REPORT    Patient Name:   Arrick L Schlie Date of Exam: 07/17/2021 Medical Rec #:  [id number]        Height:       74.0 in Accession #:    [id number]       Weight:       185.0 lb Date of Birth:  05/24/1945         BSA:          2.103 m Patient Age:    75 years         BP:           141/74 mmHg Patient Gender: M                HR:           65 bpm. Exam Location:  Inpatient  Procedure: 2D Echo  Indications:    CAD  History:        Patient has prior history of Echocardiogram examinations, most recent 07/16/2017. CAD and Previous Myocardial Infarction, Aortic Dissection, Arrythmias:Atrial Fibrillation; Risk Factors:Diabetes, Dyslipidemia and Hypertension.  Sonographer:    Stacey Plante Referring Phys: [id number] THOMAS A KELLY  IMPRESSIONS   1. Left ventricular ejection fraction, by estimation, is 40 to 45%. The left ventricle has mildly decreased function. The left ventricle demonstrates regional wall motion abnormalities (see scoring diagram/findings for description). Left ventricular diastolic parameters are consistent with Grade I diastolic dysfunction (impaired relaxation). 2. Right ventricular systolic function is normal. The right ventricular size is mildly enlarged. There is normal pulmonary artery systolic pressure. 3. The mitral valve is normal in structure. Trivial mitral valve regurgitation. 4. The aortic valve is tricuspid. There is mild calcification of the aortic valve. There is mild thickening of the aortic valve. Aortic valve regurgitation is not visualized. Aortic valve sclerosis is present, with no evidence of aortic valve stenosis.  Comparison(s): Prior images unable to be directly viewed, comparison made by report only. The left ventricular function is worsened. The left ventricular wall motion  abnormalities are new.  FINDINGS Left Ventricle: Left ventricular ejection fraction, by estimation, is 40 to 45%. The left ventricle has mildly decreased function. The left ventricle demonstrates regional wall motion abnormalities. The left ventricular internal cavity size was normal in size. There is no left ventricular hypertrophy. Left ventricular diastolic parameters are consistent with Grade I diastolic dysfunction (impaired relaxation). Normal left ventricular filling pressure.   LV Wall Scoring: The inferior wall and posterior wall are akinetic.  Right Ventricle: The right ventricular size is mildly enlarged. No increase in right ventricular wall thickness. Right ventricular systolic function is normal. There is normal pulmonary artery systolic pressure. The tricuspid regurgitant velocity is 2.64 m/s, and with an assumed right atrial pressure of 3 mmHg, the estimated right ventricular systolic pressure is 30.9 mmHg.  Left Atrium: Left atrial size was normal in size.  Right Atrium: Right atrial size was normal in size.  Pericardium: There is no evidence of pericardial effusion. Presence of epicardial fat layer.  Mitral Valve: The mitral valve is normal in structure. Trivial mitral valve regurgitation.  Tricuspid Valve: The tricuspid valve is normal in structure. Tricuspid valve regurgitation is trivial.  Aortic Valve: The aortic valve is tricuspid. There is mild calcification of the aortic valve. There is mild thickening of   the aortic valve. Aortic valve regurgitation is not visualized. Aortic valve sclerosis is present, with no evidence of aortic valve stenosis. Aortic valve mean gradient measures 4.5 mmHg. Aortic valve peak gradient measures 8.2 mmHg. Aortic valve area, by VTI measures 1.77 cm.  Pulmonic Valve: The pulmonic valve was normal in structure. Pulmonic valve regurgitation is not visualized.  Aorta: The aortic root is normal in size and structure.  IAS/Shunts: The  interatrial septum was not well visualized.  Additional Comments: A device lead is visualized in the right ventricle.   LEFT VENTRICLE PLAX 2D LVIDd:         4.30 cm   Diastology LVIDs:         3.10 cm   LV e' medial:    6.53 cm/s LV PW:         0.90 cm   LV E/e' medial:  9.8 LV IVS:        0.90 cm   LV e' lateral:   7.51 cm/s LVOT diam:     2.00 cm   LV E/e' lateral: 8.6 LV SV:         55 LV SV Index:   26 LVOT Area:     3.14 cm   RIGHT VENTRICLE RV Basal diam:  4.30 cm RV Mid diam:    4.10 cm RV S prime:     9.36 cm/s TAPSE (M-mode): 1.7 cm  LEFT ATRIUM             Index        RIGHT ATRIUM           Index LA diam:        3.20 cm 1.52 cm/m   RA Area:     22.00 cm LA Vol (A2C):   28.0 ml 13.32 ml/m  RA Volume:   70.60 ml  33.58 ml/m LA Vol (A4C):   46.7 ml 22.21 ml/m LA Biplane Vol: 40.0 ml 19.02 ml/m AORTIC VALVE                     PULMONIC VALVE AV Area (Vmax):    1.84 cm      PV Vmax:       0.79 m/s AV Area (Vmean):   1.77 cm      PV Peak grad:  2.5 mmHg AV Area (VTI):     1.77 cm AV Vmax:           143.00 cm/s AV Vmean:          100.100 cm/s AV VTI:            0.308 m AV Peak Grad:      8.2 mmHg AV Mean Grad:      4.5 mmHg LVOT Vmax:         83.90 cm/s LVOT Vmean:        56.350 cm/s LVOT VTI:          0.174 m LVOT/AV VTI ratio: 0.56  AORTA Ao Root diam: 2.80 cm Ao Asc diam:  3.30 cm  MITRAL VALVE                TRICUSPID VALVE MV Area (PHT): 2.84 cm     TR Peak grad:   27.9 mmHg MV Decel Time: 267 msec     TR Vmax:        264.00 cm/s MV E velocity: 64.30 cm/s MV A velocity: 100.00 cm/s  SHUNTS MV E/A ratio:  0.64           Systemic VTI:  0.17 m Systemic Diam: 2.00 cm  Mihai Croitoru MD Electronically signed by Mihai Croitoru MD Signature Date/Time: 07/17/2021/2:13:04 PM    Final              Echo 07/17/2021: IMPRESSIONS   1. Left ventricular ejection fraction, by estimation, is 40 to 45%. The  left ventricle has mildly decreased  function. The left ventricle  demonstrates regional wall motion abnormalities (see scoring  diagram/findings for description). Left ventricular  diastolic parameters are consistent with Grade I diastolic dysfunction  (impaired relaxation).   2. Right ventricular systolic function is normal. The right ventricular  size is mildly enlarged. There is normal pulmonary artery systolic  pressure.   3. The mitral valve is normal in structure. Trivial mitral valve  regurgitation.   4. The aortic valve is tricuspid. There is mild calcification of the  aortic valve. There is mild thickening of the aortic valve. Aortic valve  regurgitation is not visualized. Aortic valve sclerosis is present, with  no evidence of aortic valve stenosis.   Comparison(s): Prior images unable to be directly viewed, comparison made  by report only. The left ventricular function is worsened. The left  ventricular wall motion abnormalities are new.   Left Cardiac Cath 07/17/2021:   Mid RCA to Dist RCA lesion is 75% stenosed.   Dist RCA lesion is 100% stenosed.   Prox LAD to Mid LAD lesion is 80% stenosed.   Mid RCA lesion is 80% stenosed.   LV end diastolic pressure is normal.  Diagnostic Dominance: Right  LE Doppler 04/12/2021: Summary:  Right: Resting right ankle-brachial index indicates moderate right lower  extremity arterial disease. The right toe-brachial index is abnormal.   Left: Resting left ankle-brachial index indicates mild left lower  extremity arterial disease. The left toe-brachial index is abnormal.   EVAR Duplex 04/12/2022: Summary:  Abdominal Aorta: Patent endovascular aneurysm repair with no evidence of endoleak.   Echocardiogram 07/16/2017: - Left ventricle: The cavity size was normal. Wall thickness was    normal. Systolic function was normal. The estimated ejection    fraction was in the range of 55% to 60%. Wall motion was normal;    there were no regional wall motion abnormalities.  Doppler    parameters are consistent with abnormal left ventricular    relaxation (grade 1 diastolic dysfunction).  - Mitral valve: There was mild regurgitation.  - Pulmonary arteries: Systolic pressure was moderately increased.    PA peak pressure: 40 mm Hg (S).   Echo in 2014 showed EF of 30%   EKG:  EKG is personally reviewed and interpreted.  08/14/2022-atrial sensing ventricular pacing heart rate 72 bpm 03/19/2022: EKG was not ordered. 12/17/2021: Rate 70 bpm. ECG upright QRS lead V1 and negative QRS lead I active BS   EKG is  ordered today.  The ekg ordered today demonstrates sinus rhythm V paced 63 bpm  Recent Labs: 04/04/2022: Pro B Natriuretic peptide (BNP) 63.0 08/30/2022: ALT 10; BUN 17; Creatinine, Ser 1.10; Hemoglobin 13.8; Platelets 359; Potassium 4.3; Sodium 135  Recent Lipid Panel    Component Value Date/Time   CHOL 153 04/16/2022 1051   TRIG 346.0 (H) 04/16/2022 1051   HDL 34.40 (L) 04/16/2022 1051   CHOLHDL 4 04/16/2022 1051   VLDL 69.2 (H) 04/16/2022 1051   LDLCALC 74 04/13/2021 1628   LDLDIRECT 75.0 04/16/2022 1051     Risk Assessment/Calculations:              Physical Exam:      VS:  BP 138/86   Pulse 71   Ht 6' 2" (1.88 m)   Wt 186 lb 6.4 oz (84.6 kg)   SpO2 94%   BMI 23.93 kg/m     Wt Readings from Last 3 Encounters:  09/11/22 186 lb 6.4 oz (84.6 kg)  08/30/22 184 lb 1.6 oz (83.5 kg)  08/14/22 185 lb 6.4 oz (84.1 kg)     GEN:  Well nourished, well developed in no acute distress HEENT: Normal NECK: No JVD; No carotid bruits LYMPHATICS: No lymphadenopathy CARDIAC:  RRR, no murmurs, no rubs, gallops RESPIRATORY:  Clear to auscultation without rales, wheezing or rhonchi  ABDOMEN: Soft, non-tender, non-distended MUSCULOSKELETAL:  No edema; No deformity  SKIN: Warm and dry NEUROLOGIC:  Alert and oriented x 3 PSYCHIATRIC:  Normal affect   ASSESSMENT:    1. Coronary artery disease involving native coronary artery of native heart with  refractory angina pectoris (HCC)   2. Complete heart block (HCC)   3. LBBB (left bundle branch block)   4. Chronic systolic heart failure (HCC)   5. IPF (idiopathic pulmonary fibrosis) (HCC)       PLAN:    In order of problems listed above:    CAD (coronary artery disease) with angina Prior heart catheterization in 2013 as well as 2023 resulted in medical management with distal RCA CTO with collaterals from circumflex and LAD moderate to severe disease but highly calcified.  Now however he is starting to have new anginal symptoms.  He is going to be undergoing revascularization of lower extremity with Dr. Cain.  Since he is having symptoms, I would like for him to proceed with left heart catheterization once again with orbital atherectomy of the significant LAD lesion. Will be with Dr. Varanasi on 5/15.   Stationary bicycle, treadmill.  Did have an episode where he felt fairly winded after cutting the grass.  Of course this could be multifactorial especially given the pollen count currently.  Continue to monitor.  Complete heart block (HCC) ICD/CRT with recent replacement June 2022, Dr. Klein.  Working well.  No changes made.  Pacemaker seen on ECG.   IPF (idiopathic pulmonary fibrosis) (HCC) Pulmonary notes reviewed.  Dr. Ramaswamy is following. Stable.  Stable testing.   Biventricular ICD (implantable cardioverter-defibrillator) in place Dr. Klein monitoring.  No changes   Chronic systolic heart failure We had suggested previously Entresto as well as spironolactone as well as Jardiance.  Not able to take these medications due to side effects.  He is on valsartan.  We will continue with this.  He is also on carvedilol.  No changes made.  Isosorbide as well.   PVD -Dr. Cain, right leg 60%, foot wound, wound care, Abx.  Discussed potential revascularization surgery as above.      Medication Adjustments/Labs and Tests Ordered: Current medicines are reviewed at length with the  patient today.  Concerns regarding medicines are outlined above.  Orders Placed This Encounter  Procedures   Basic metabolic panel   CBC   No orders of the defined types were placed in this encounter.   Patient Instructions  Medication Instructions:  Your physician recommends that you continue on your current medications as directed. Please refer to the Current Medication list given to you today.  *If you need a refill on your cardiac medications before your next appointment, please call your pharmacy*   Lab Work: Your physician recommends that you return for lab work today- BMP and CBC  If you have   labs (blood work) drawn today and your tests are completely normal, you will receive your results only by: MyChart Message (if you have MyChart) OR A paper copy in the mail If you have any lab test that is abnormal or we need to change your treatment, we will call you to review the results.   Testing/Procedures: You are scheduled for a Cardiac Catheterization on Wednesday, May 15 with Dr. Jayadeep Varanasi.  1. Please arrive at the [facility] (Main Entrance A) at  Hospital: [address] [place], [place] [address] at 8:00 AM (This time is 2 hour(s) before your procedure to ensure your preparation). Free valet parking service is available. You will check in at ADMITTING. The support person will be asked to wait in the waiting room.  It is OK to have someone drop you off and come back when you are ready to be discharged.    Special note: Every effort is made to have your procedure done on time. Please understand that emergencies sometimes delay scheduled procedures.  2. Diet: Do not eat solid foods after midnight.  The patient may have clear liquids until 5am upon the day of the procedure.  3. Labs: Labs done at OV 5/8  4. Medication instructions in preparation for your procedure:  Take only 1/2 dose  units of insulin the night before your procedure. Do not take any insulin  on the day of the procedure.  Do not take Diabetes Med Glucophage (Metformin) on the day of the procedure and HOLD 48 HOURS AFTER THE PROCEDURE.  Hold Glipizide morning of procedure   On the morning of your procedure, take your Aspirin 81 mg and any morning medicines NOT listed above.  You may use sips of water.  5. Plan to go home the same day, you will only stay overnight if medically necessary. 6. Bring a current list of your medications and current insurance cards. 7. You MUST have a responsible person to drive you home. 8. Someone MUST be with you the first 24 hours after you arrive home or your discharge will be delayed. 9. Please wear clothes that are easy to get on and off and wear slip-on shoes.  Thank you for allowing us to care for you!   -- [facility]   Follow-Up: At [facility], you and your health needs are our priority.  As part of our continuing mission to provide you with exceptional heart care, we have created designated Provider Care Teams.  These Care Teams include your primary Cardiologist (physician) and Advanced Practice Providers (APPs -  Physician Assistants and Nurse Practitioners) who all work together to provide you with the care you need, when you need it.  We recommend signing up for the patient portal called "MyChart".  Sign up information is provided on this After Visit Summary.  MyChart is used to connect with patients for Virtual Visits (Telemedicine).  Patients are able to view lab/test results, encounter notes, upcoming appointments, etc.  Non-urgent messages can be sent to your provider as well.   To learn more about what you can do with MyChart, go to [email or web address]://[email or web address].com.    Your next appointment:   2 week(s) post cath   Provider:   Leaf Kernodle, MD  or APP     I,Rachel Rivera,acting as a scribe for Matti Killingsworth, MD.,have documented all relevant documentation on the behalf of Thula Stewart, MD,as directed  by  Gaelle Adriance, MD while in the presence of Shamarion Coots   Rosann Gorum, MD.  I, Alexsis Kathman, MD, have reviewed all documentation for this visit. The documentation on 09/11/22 for the exam, diagnosis, procedures, and orders are all accurate and complete.   Signed, Sedra Morfin, MD  09/11/2022 4:35 PM    [facility] 

## 2022-09-11 NOTE — Progress Notes (Signed)
Cardiology Office Note:    Date:  09/11/2022   ID:  Luke [name], DOB [birth year], MRN [id number]  PCP:  [name], MD   [facility] Providers Cardiologist:  Donato Schultz, MD     Referring MD: [name], MD    History of Present Illness:    Luke [name] is a 77 y.o. male here for for follow-up of coronary artery disease, cardiac catheterization 07/17/2021 80% LAD calcified lesion treated medically, distal RCA CTO disease with collateral blood flow from circumflex, EF 40 to 45%, CRT ICD.  LHC 2013 - 2V CAD Left anterior descending (LAD): focal 70-80% proximal. Long 40% mid vessel. Right coronary artery (RCA): Diffusely diseased. 50-60% mid vessel, 70% distal, 70% at bifurcation of PDA/PLOM.   During a prior visit on 01/01/2021 ischemic cardiomyopathy left bundle branch block CRT ICD Dr. Graciela Husbands after an intermittent complete heart block.  He goes by Luke [name].  He is a former Dr. Myrtis Ser patient.  No previous mode switches, normal device function.  He is a [place] vet.    Stage 3a kidney disease. He is currently taking low dose valsartan. He takes his medication at night time because if he takes his medication during the day he feels unwell and has trouble with his balance.  He tried to take Jardiance but this caused significant diarrhea.  He experiences shortness of breath or windedness, mainly with exertion. Has been diagnosed with IPF but his lung findings are stable he states.  Discussed revascularization of right limb with Dr. Randie Heinz.  Discussed some shortness of breath with cutting the grass.  Wife present.  He called back to the office and stated that he was having some increasing anginal type symptoms.  Past Medical History:  Diagnosis Date   AAA (abdominal aortic aneurysm) (HCC) 2011   Per vascular surgery   Anginal pain (HCC) 2024   With exertion   Atrial fibrillation (HCC)    Automatic implantable cardioverter-defibrillator in situ    CAD (coronary artery  disease)    Presumed CAD with nuclear scan October 09, 2011,  large anteroseptal MI and inferior MI. Catheterization scheduled October 15, 2011   Cardiomyopathy [facility])    Nuclear, October 09, 2011, EF 30%, multiple focal wall motion abnormalities   Chronic kidney disease    CKD3   Diabetes mellitus    type II   Drug therapy    Hyperkalemia with spironolactone   Dyspnea    Ejection fraction < 50%    EF 30%, nuclear, October 09, 2011   Ejection fraction < 50%    EF previously 30%  //   EF 30-35%, echo, July 14, 2012, severe diffuse hypokinesis, PA pressure 43 mm mercury   Fall due to ice or snow 06/24/2013   GERD (gastroesophageal reflux disease)    Silent   HLD (hyperlipidemia)    Hypertension    white coat HTN-- often elevated in office and controlled on outside checks.   ICD (implantable cardioverter-defibrillator) in place    CRT-D placed March, 2014 complete heart block and k dysfunction   IPF (idiopathic pulmonary fibrosis) (HCC)    LBBB (left bundle branch block)    LBBB on EKG October 11, 2011,  no prior EKG has been done   Low testosterone    Hx of   Myocardial infarction (HCC)    Told in 2015-never knew-showed up on stress test for AAA;    Pacemaker    PAD (peripheral artery disease) (HCC)  Pancreatitis     Past Surgical History:  Procedure Laterality Date   ABDOMINAL AORTAGRAM N/A 10/28/2011   Procedure: ABDOMINAL Ronny Flurry;  Surgeon: [name], MD;  Location: [facility] CATH LAB;  Service: Cardiovascular;  Laterality: N/A;   ABDOMINAL AORTIC ANEURYSM REPAIR     EVAR    ABDOMINAL AORTOGRAM N/A 05/27/2022   Procedure: ABDOMINAL AORTOGRAM;  Surgeon: Maeola Harman, MD;  Location: [facility] INVASIVE CV LAB;  Service: Cardiovascular;  Laterality: N/A;   BI-VENTRICULAR IMPLANTABLE CARDIOVERTER DEFIBRILLATOR N/A 07/16/2012   Procedure: BI-VENTRICULAR IMPLANTABLE CARDIOVERTER DEFIBRILLATOR  (CRT-D);  Surgeon: Duke Salvia, MD;  Location: [facility] CATH LAB;  Service: Cardiovascular;   Laterality: N/A;   BIV ICD GENERATOR CHANGEOUT N/A 10/25/2020   Procedure: BIV ICD GENERATOR CHANGEOUT;  Surgeon: Duke Salvia, MD;  Location: [facility] INVASIVE CV LAB;  Service: Cardiovascular;  Laterality: N/A;   CARDIAC CATHETERIZATION     CATARACT EXTRACTION Left 2023   COLONOSCOPY  03/23/2018; 2020   ESOPHAGOGASTRODUODENOSCOPY N/A 02/12/2019   Procedure: ESOPHAGOGASTRODUODENOSCOPY (EGD);  Surgeon: Lynann Bologna, MD;  Location: [facility] ENDOSCOPY;  Service: Endoscopy;  Laterality: N/A;   ESOPHAGOGASTRODUODENOSCOPY (EGD) WITH PROPOFOL N/A 02/05/2018   Procedure: ESOPHAGOGASTRODUODENOSCOPY (EGD) WITH PROPOFOL;  Surgeon: Napoleon Form, MD;  Location: [facility] ENDOSCOPY;  Service: Endoscopy;  Laterality: N/A;   ESOPHAGOGASTRODUODENOSCOPY (EGD) WITH PROPOFOL N/A 09/27/2018   Procedure: ESOPHAGOGASTRODUODENOSCOPY (EGD) WITH PROPOFOL;  Surgeon: Jeani Hawking, MD;  Location: [facility] ENDOSCOPY;  Service: Endoscopy;  Laterality: N/A;   FOREIGN BODY REMOVAL  09/27/2018   Procedure: FOREIGN BODY REMOVAL;  Surgeon: Jeani Hawking, MD;  Location: [facility] ENDOSCOPY;  Service: Endoscopy;;   FOREIGN BODY REMOVAL  02/12/2019   Procedure: FOREIGN BODY REMOVAL;  Surgeon: Lynann Bologna, MD;  Location: [facility] ENDOSCOPY;  Service: Endoscopy;;   HERNIA REPAIR  05/14/2013   INSERTION OF MESH N/A 05/14/2013   Procedure: INSERTION OF MESH;  Surgeon: Ardeth Sportsman, MD;  Location: [facility] OR;  Service: General;  Laterality: N/A;   LEFT HEART CATH AND CORONARY ANGIOGRAPHY N/A 07/17/2021   Procedure: LEFT HEART CATH AND CORONARY ANGIOGRAPHY;  Surgeon: Lennette Bihari, MD;  Location: [facility] INVASIVE CV LAB;  Service: Cardiovascular;  Laterality: N/A;   LOWER EXTREMITY ANGIOGRAM Bilateral 10/28/2011   Procedure: LOWER EXTREMITY ANGIOGRAM;  Surgeon: [name], MD;  Location: [facility] CATH LAB;  Service: Cardiovascular;  Laterality: Bilateral;   LOWER EXTREMITY ANGIOGRAPHY  05/27/2022   Procedure: Lower Extremity Angiography;  Surgeon: Maeola Harman, MD;  Location: [facility] INVASIVE CV LAB;  Service: Cardiovascular;;   PACEMAKER INSERTION  07/16/2012   pacemaker/defibrilator   POLYPECTOMY     POSTERIOR CERVICAL FUSION/FORAMINOTOMY N/A 06/09/2014   Procedure: Laminectomy - Cervical two-Cervcial four posterior cervical instrumented fusion Cervical two-cervical four;  Surgeon: Tia Alert, MD;  Location: [facility] NEURO ORS;  Service: Neurosurgery;  Laterality: N/A;  posterior    SAVORY DILATION N/A 02/05/2018   Procedure: SAVORY DILATION;  Surgeon: Napoleon Form, MD;  Location: [facility] ENDOSCOPY;  Service: Endoscopy;  Laterality: N/A;   TONSILLECTOMY     as a child    UMBILICAL HERNIA REPAIR N/A 05/14/2013   Procedure: LAPAROSCOPIC exploration and repair of hernia in abdominal ;  Surgeon: Ardeth Sportsman, MD;  Location: [facility] OR;  Service: General;  Laterality: N/A;   UPPER GASTROINTESTINAL ENDOSCOPY  2020    Current Medications: Current Meds  Medication Sig   albuterol (VENTOLIN HFA) 108 (90 Base) MCG/ACT inhaler Inhale 2 puffs into the lungs every 6 (six) hours  as needed for wheezing or shortness of breath.   amLODipine (NORVASC) 10 MG tablet TAKE 1 TABLET(10 MG) BY MOUTH DAILY (Patient taking differently: Take 10 mg by mouth daily with supper.)   ASPIRIN EC PO Take 650 mg by mouth in the morning and at bedtime.   carvedilol (COREG) 25 MG tablet TAKE 1 TABLET BY MOUTH TWICE DAILY WITH MEALS   Cholecalciferol (VITAMIN D3) 50 MCG (2000 UT) TABS Take 2,000 Units by mouth in the morning.   ciprofloxacin (CIPRO) 500 MG tablet Take 1 tablet (500 mg total) by mouth 2 (two) times daily for 10 days.   Cyanocobalamin (VITAMIN B-12 PO) Take 2,000 mcg by mouth in the morning.   doxycycline (VIBRA-TABS) 100 MG tablet Take 1 tablet (100 mg total) by mouth 2 (two) times daily.   ESBRIET 267 MG TABS Take 2 tablets (534 mg total) by mouth in the morning, at noon, and at bedtime. **NOTE LOW DOSE AS MAINTENANCE**   famotidine (PEPCID) 20 MG  tablet Take 1 tablet (20 mg total) by mouth at bedtime.   glipiZIDE (GLUCOTROL) 5 MG tablet TAKE 1 TABLET(5 MG) BY MOUTH TWICE DAILY BEFORE A MEAL   glucose blood (ACCU-CHEK AVIVA PLUS) test strip AS DIRECTED TO CHECK BLOOD SUGAR TWICE DAILY   insulin glargine (LANTUS SOLOSTAR) 100 UNIT/ML Solostar Pen INJECT 0.3 TO 0.35 MLS(30 TO 35 UNITS) INTO THE SKIN EVERY DAY (Patient taking differently: Inject 30 Units into the skin at bedtime.)   isosorbide mononitrate (IMDUR) 30 MG 24 hr tablet Take 1 tablet (30 mg total) by mouth daily. (Patient taking differently: Take 30 mg by mouth daily with supper.)   metFORMIN (GLUCOPHAGE) 500 MG tablet TAKE 2 TABLETS BY MOUTH TWICE DAILY WITH FOOD   Multiple Vitamin (MULTIVITAMIN WITH MINERALS) TABS tablet Take 1 tablet by mouth in the morning.   omeprazole (PRILOSEC) 40 MG capsule TAKE 1 CAPSULE(40 MG) BY MOUTH DAILY   simvastatin (ZOCOR) 40 MG tablet TAKE 1 TABLET BY MOUTH AT BEDTIME   umeclidinium bromide (INCRUSE ELLIPTA) 62.5 MCG/ACT AEPB Inhale 1 puff into the lungs daily.   valsartan (DIOVAN) 40 MG tablet Take 20 mg by mouth daily with supper.     Allergies:   Aldactone [spironolactone], Codeine, Atorvastatin, Januvia [sitagliptin], Jardiance [empagliflozin], and Lisinopril   Social History   Socioeconomic History   Marital status: Married    Spouse name: Patti   Number of children: 1   Years of education: Not on file   Highest education level: Not on file  Occupational History   Occupation: retired  Tobacco Use   Smoking status: Former    Packs/day: 2.00    Years: 40.00    Additional pack years: 0.00    Total pack years: 80.00    Types: Cigarettes    Start date: 1964    Quit date: 05/06/2004    Years since quitting: 18.3    Passive exposure: Never   Smokeless tobacco: Never  Vaping Use   Vaping Use: Never used  Substance and Sexual Activity   Alcohol use: Not Currently   Drug use: No   Sexual activity: Yes    Partners: Female  Other  Topics Concern   Not on file  Social History Narrative   [place] vet, he has known service related agent orange exposure.    Retired   Community education officer daily.     Social Determinants of Health   Financial Resource Strain: Low Risk  (04/22/2022)   Overall Financial Resource Strain (CARDIA)  Difficulty of Paying Living Expenses: Not hard at all  Food Insecurity: No Food Insecurity (04/22/2022)   Hunger Vital Sign    Worried About Running Out of Food in the Last Year: Never true    Ran Out of Food in the Last Year: Never true  Transportation Needs: No Transportation Needs (04/22/2022)   PRAPARE - Administrator, Civil Service (Medical): No    Lack of Transportation (Non-Medical): No  Physical Activity: Sufficiently Active (04/22/2022)   Exercise Vital Sign    Days of Exercise per Week: 6 days    Minutes of Exercise per Session: 60 min  Stress: No Stress Concern Present (04/22/2022)   [employer] of Occupational Health - Occupational Stress Questionnaire    Feeling of Stress : Not at all  Social Connections: Moderately Isolated (04/22/2022)   Social Connection and Isolation Panel [NHANES]    Frequency of Communication with Friends and Family: More than three times a week    Frequency of Social Gatherings with Friends and Family: Twice a week    Attends Religious Services: Never    Database administrator or Organizations: No    Attends Engineer, structural: Never    Marital Status: Married     Family History: The patient's family history includes Diabetes in his sister; Heart attack in his sister; Heart disease in his sister; Hyperlipidemia in his mother and sister; Hypertension in his mother, sister, and son; Lung cancer in his father; Stroke in his mother. There is no history of Colon cancer, Prostate cancer, Esophageal cancer, Stomach cancer, Rectal cancer, Colon polyps, or Pancreatic cancer.  ROS:   Please see the history of present illness.    (+)  Shortness of breath (mainly with exertion) No syncope all other systems reviewed and are negative.  EKGs/Labs/Other Studies Reviewed:    The following studies were reviewed today:  Cardiac Studies & Procedures   CARDIAC CATHETERIZATION  CARDIAC CATHETERIZATION 07/17/2021  Narrative   Mid RCA to Dist RCA lesion is 75% stenosed.   Dist RCA lesion is 100% stenosed.   Prox LAD to Mid LAD lesion is 80% stenosed.   Mid RCA lesion is 80% stenosed.   LV end diastolic pressure is normal.  Two-vessel CAD with 80% focal proximal LAD stenosis which is unchanged from 2013 except with development of significant calcification.  Diffusely calcified RCA subtotal/total occlusion distally but with excellent left-to-right collateralization to the PDA and PLA system.  Normal tortuous left circumflex coronary artery.  Relatively preserved LV function with LV EDP 11 mmHg.  RECOMMENDATION: The angiographic findings were reviewed with the catheterization from 2013.  The LAD stenosis is not significantly changed except is more calcified.  The RCA occlusion is new.  I suspect the recent exertional symptomatology is due to RCA occlusion with chest pain developing after walking approximately 20 minutes on a treadmill.  I discussed the findings with Dr. Anne Fu.  We will plan will be an increase medical regimen with further titration of isosorbide and possible initiation of Ranexa.   If he develops increasing symptomatology, consideration for surgical revascularization and/or orbital atherectomy to the calcified LAD stenosis can be considered.  Findings Coronary Findings Diagnostic  Dominance: Right  Left Anterior Descending Prox LAD to Mid LAD lesion is 80% stenosed.  First Diagonal Branch Vessel is small in size.  Right Coronary Artery Mid RCA lesion is 80% stenosed. Mid RCA to Dist RCA lesion is 75% stenosed. Dist RCA lesion is 100% stenosed.  Third  Right Posterolateral Branch Collaterals 3rd RPL  filled by collaterals from Dist Cx.  Intervention  No interventions have been documented.     ECHOCARDIOGRAM  ECHOCARDIOGRAM COMPLETE 07/17/2021  Narrative ECHOCARDIOGRAM REPORT    Patient Name:   THIENAN PERSONS Date of Exam: 07/17/2021 Medical Rec #:  [id number]        Height:       74.0 in Accession #:    [id number]       Weight:       185.0 lb Date of Birth:  12-23-1945         BSA:          2.103 m Patient Age:    75 years         BP:           141/74 mmHg Patient Gender: M                HR:           65 bpm. Exam Location:  Inpatient  Procedure: 2D Echo  Indications:    CAD  History:        Patient has prior history of Echocardiogram examinations, most recent 07/16/2017. CAD and Previous Myocardial Infarction, Aortic Dissection, Arrythmias:Atrial Fibrillation; Risk Factors:Diabetes, Dyslipidemia and Hypertension.  Sonographer:    Devonne Doughty Referring Phys: [phone] THOMAS A KELLY  IMPRESSIONS   1. Left ventricular ejection fraction, by estimation, is 40 to 45%. The left ventricle has mildly decreased function. The left ventricle demonstrates regional wall motion abnormalities (see scoring diagram/findings for description). Left ventricular diastolic parameters are consistent with Grade I diastolic dysfunction (impaired relaxation). 2. Right ventricular systolic function is normal. The right ventricular size is mildly enlarged. There is normal pulmonary artery systolic pressure. 3. The mitral valve is normal in structure. Trivial mitral valve regurgitation. 4. The aortic valve is tricuspid. There is mild calcification of the aortic valve. There is mild thickening of the aortic valve. Aortic valve regurgitation is not visualized. Aortic valve sclerosis is present, with no evidence of aortic valve stenosis.  Comparison(s): Prior images unable to be directly viewed, comparison made by report only. The left ventricular function is worsened. The left ventricular wall motion  abnormalities are new.  FINDINGS Left Ventricle: Left ventricular ejection fraction, by estimation, is 40 to 45%. The left ventricle has mildly decreased function. The left ventricle demonstrates regional wall motion abnormalities. The left ventricular internal cavity size was normal in size. There is no left ventricular hypertrophy. Left ventricular diastolic parameters are consistent with Grade I diastolic dysfunction (impaired relaxation). Normal left ventricular filling pressure.   LV Wall Scoring: The inferior wall and posterior wall are akinetic.  Right Ventricle: The right ventricular size is mildly enlarged. No increase in right ventricular wall thickness. Right ventricular systolic function is normal. There is normal pulmonary artery systolic pressure. The tricuspid regurgitant velocity is 2.64 m/s, and with an assumed right atrial pressure of 3 mmHg, the estimated right ventricular systolic pressure is 30.9 mmHg.  Left Atrium: Left atrial size was normal in size.  Right Atrium: Right atrial size was normal in size.  Pericardium: There is no evidence of pericardial effusion. Presence of epicardial fat layer.  Mitral Valve: The mitral valve is normal in structure. Trivial mitral valve regurgitation.  Tricuspid Valve: The tricuspid valve is normal in structure. Tricuspid valve regurgitation is trivial.  Aortic Valve: The aortic valve is tricuspid. There is mild calcification of the aortic valve. There is mild thickening of  the aortic valve. Aortic valve regurgitation is not visualized. Aortic valve sclerosis is present, with no evidence of aortic valve stenosis. Aortic valve mean gradient measures 4.5 mmHg. Aortic valve peak gradient measures 8.2 mmHg. Aortic valve area, by VTI measures 1.77 cm.  Pulmonic Valve: The pulmonic valve was normal in structure. Pulmonic valve regurgitation is not visualized.  Aorta: The aortic root is normal in size and structure.  IAS/Shunts: The  interatrial septum was not well visualized.  Additional Comments: A device lead is visualized in the right ventricle.   LEFT VENTRICLE PLAX 2D LVIDd:         4.30 cm   Diastology LVIDs:         3.10 cm   LV e' medial:    6.53 cm/s LV PW:         0.90 cm   LV E/e' medial:  9.8 LV IVS:        0.90 cm   LV e' lateral:   7.51 cm/s LVOT diam:     2.00 cm   LV E/e' lateral: 8.6 LV SV:         55 LV SV Index:   26 LVOT Area:     3.14 cm   RIGHT VENTRICLE RV Basal diam:  4.30 cm RV Mid diam:    4.10 cm RV S prime:     9.36 cm/s TAPSE (M-mode): 1.7 cm  LEFT ATRIUM             Index        RIGHT ATRIUM           Index LA diam:        3.20 cm 1.52 cm/m   RA Area:     22.00 cm LA Vol (A2C):   28.0 ml 13.32 ml/m  RA Volume:   70.60 ml  33.58 ml/m LA Vol (A4C):   46.7 ml 22.21 ml/m LA Biplane Vol: 40.0 ml 19.02 ml/m AORTIC VALVE                     PULMONIC VALVE AV Area (Vmax):    1.84 cm      PV Vmax:       0.79 m/s AV Area (Vmean):   1.77 cm      PV Peak grad:  2.5 mmHg AV Area (VTI):     1.77 cm AV Vmax:           143.00 cm/s AV Vmean:          100.100 cm/s AV VTI:            0.308 m AV Peak Grad:      8.2 mmHg AV Mean Grad:      4.5 mmHg LVOT Vmax:         83.90 cm/s LVOT Vmean:        56.350 cm/s LVOT VTI:          0.174 m LVOT/AV VTI ratio: 0.56  AORTA Ao Root diam: 2.80 cm Ao Asc diam:  3.30 cm  MITRAL VALVE                TRICUSPID VALVE MV Area (PHT): 2.84 cm     TR Peak grad:   27.9 mmHg MV Decel Time: 267 msec     TR Vmax:        264.00 cm/s MV E velocity: 64.30 cm/s MV A velocity: 100.00 cm/s  SHUNTS MV E/A ratio:  0.64  Systemic VTI:  0.17 m Systemic Diam: 2.00 cm  Thurmon Fair MD Electronically signed by Thurmon Fair MD Signature Date/Time: 07/17/2021/2:13:04 PM    Final              Echo 07/17/2021: IMPRESSIONS   1. Left ventricular ejection fraction, by estimation, is 40 to 45%. The  left ventricle has mildly decreased  function. The left ventricle  demonstrates regional wall motion abnormalities (see scoring  diagram/findings for description). Left ventricular  diastolic parameters are consistent with Grade I diastolic dysfunction  (impaired relaxation).   2. Right ventricular systolic function is normal. The right ventricular  size is mildly enlarged. There is normal pulmonary artery systolic  pressure.   3. The mitral valve is normal in structure. Trivial mitral valve  regurgitation.   4. The aortic valve is tricuspid. There is mild calcification of the  aortic valve. There is mild thickening of the aortic valve. Aortic valve  regurgitation is not visualized. Aortic valve sclerosis is present, with  no evidence of aortic valve stenosis.   Comparison(s): Prior images unable to be directly viewed, comparison made  by report only. The left ventricular function is worsened. The left  ventricular wall motion abnormalities are new.   Left Cardiac Cath 07/17/2021:   Mid RCA to Dist RCA lesion is 75% stenosed.   Dist RCA lesion is 100% stenosed.   Prox LAD to Mid LAD lesion is 80% stenosed.   Mid RCA lesion is 80% stenosed.   LV end diastolic pressure is normal.  Diagnostic Dominance: Right  LE Doppler 04/12/2021: Summary:  Right: Resting right ankle-brachial index indicates moderate right lower  extremity arterial disease. The right toe-brachial index is abnormal.   Left: Resting left ankle-brachial index indicates mild left lower  extremity arterial disease. The left toe-brachial index is abnormal.   EVAR Duplex 04/12/2022: Summary:  Abdominal Aorta: Patent endovascular aneurysm repair with no evidence of endoleak.   Echocardiogram 07/16/2017: - Left ventricle: The cavity size was normal. Wall thickness was    normal. Systolic function was normal. The estimated ejection    fraction was in the range of 55% to 60%. Wall motion was normal;    there were no regional wall motion abnormalities.  Doppler    parameters are consistent with abnormal left ventricular    relaxation (grade 1 diastolic dysfunction).  - Mitral valve: There was mild regurgitation.  - Pulmonary arteries: Systolic pressure was moderately increased.    PA peak pressure: 40 mm Hg (S).   Echo in 2014 showed EF of 30%   EKG:  EKG is personally reviewed and interpreted.  08/14/2022-atrial sensing ventricular pacing heart rate 72 bpm 03/19/2022: EKG was not ordered. 12/17/2021: Rate 70 bpm. ECG upright QRS lead V1 and negative QRS lead I active BS   EKG is  ordered today.  The ekg ordered today demonstrates sinus rhythm V paced 63 bpm  Recent Labs: 04/04/2022: Pro B Natriuretic peptide (BNP) 63.0 08/30/2022: ALT 10; BUN 17; Creatinine, Ser 1.10; Hemoglobin 13.8; Platelets 359; Potassium 4.3; Sodium 135  Recent Lipid Panel    Component Value Date/Time   CHOL 153 04/16/2022 1051   TRIG 346.0 (H) 04/16/2022 1051   HDL 34.40 (L) 04/16/2022 1051   CHOLHDL 4 04/16/2022 1051   VLDL 69.2 (H) 04/16/2022 1051   LDLCALC 74 04/13/2021 1628   LDLDIRECT 75.0 04/16/2022 1051     Risk Assessment/Calculations:              Physical Exam:  VS:  BP 138/86   Pulse 71   Ht 6\' 2"  (1.88 m)   Wt 186 lb 6.4 oz (84.6 kg)   SpO2 94%   BMI 23.93 kg/m     Wt Readings from Last 3 Encounters:  09/11/22 186 lb 6.4 oz (84.6 kg)  08/30/22 184 lb 1.6 oz (83.5 kg)  08/14/22 185 lb 6.4 oz (84.1 kg)     GEN:  Well nourished, well developed in no acute distress HEENT: Normal NECK: No JVD; No carotid bruits LYMPHATICS: No lymphadenopathy CARDIAC:  RRR, no murmurs, no rubs, gallops RESPIRATORY:  Clear to auscultation without rales, wheezing or rhonchi  ABDOMEN: Soft, non-tender, non-distended MUSCULOSKELETAL:  No edema; No deformity  SKIN: Warm and dry NEUROLOGIC:  Alert and oriented x 3 PSYCHIATRIC:  Normal affect   ASSESSMENT:    1. Coronary artery disease involving native coronary artery of native heart with  refractory angina pectoris (HCC)   2. Complete heart block (HCC)   3. LBBB (left bundle branch block)   4. Chronic systolic heart failure (HCC)   5. IPF (idiopathic pulmonary fibrosis) (HCC)       PLAN:    In order of problems listed above:    CAD (coronary artery disease) with angina Prior heart catheterization in 2013 as well as 2023 resulted in medical management with distal RCA CTO with collaterals from circumflex and LAD moderate to severe disease but highly calcified.  Now however he is starting to have new anginal symptoms.  He is going to be undergoing revascularization of lower extremity with Dr. Randie Heinz.  Since he is having symptoms, I would like for him to proceed with left heart catheterization once again with orbital atherectomy of the significant LAD lesion. Will be with Dr. Eldridge Dace on 5/15.   Stationary bicycle, treadmill.  Did have an episode where he felt fairly winded after cutting the grass.  Of course this could be multifactorial especially given the pollen count currently.  Continue to monitor.  Complete heart block [place]) ICD/CRT with recent replacement June 2022, Dr. Graciela Husbands.  Working well.  No changes made.  Pacemaker seen on ECG.   IPF (idiopathic pulmonary fibrosis) (HCC) Pulmonary notes reviewed.  Dr. Marchelle Gearing is following. Stable.  Stable testing.   Biventricular ICD (implantable cardioverter-defibrillator) in place Dr. Graciela Husbands monitoring.  No changes   Chronic systolic heart failure We had suggested previously Entresto as well as spironolactone as well as Jardiance.  Not able to take these medications due to side effects.  He is on valsartan.  We will continue with this.  He is also on carvedilol.  No changes made.  Isosorbide as well.   PVD -Dr. Randie Heinz, right leg 60%, foot wound, wound care, Abx.  Discussed potential revascularization surgery as above.      Medication Adjustments/Labs and Tests Ordered: Current medicines are reviewed at length with the  patient today.  Concerns regarding medicines are outlined above.  Orders Placed This Encounter  Procedures   Basic metabolic panel   CBC   No orders of the defined types were placed in this encounter.   Patient Instructions  Medication Instructions:  Your physician recommends that you continue on your current medications as directed. Please refer to the Current Medication list given to you today.  *If you need a refill on your cardiac medications before your next appointment, please call your pharmacy*   Lab Work: Your physician recommends that you return for lab work today- BMP and CBC  If you have  labs (blood work) drawn today and your tests are completely normal, you will receive your results only by: MyChart Message (if you have MyChart) OR A paper copy in the mail If you have any lab test that is abnormal or we need to change your treatment, we will call you to review the results.   Testing/Procedures: You are scheduled for a Cardiac Catheterization on Wednesday, May 15 with Dr. Lance Muss.  1. Please arrive at the [facility] (Main Entrance A) at [facility]: [address] [place], [place] [address] at 8:00 AM (This time is 2 hour(s) before your procedure to ensure your preparation). Free valet parking service is available. You will check in at ADMITTING. The support person will be asked to wait in the waiting room.  It is OK to have someone drop you off and come back when you are ready to be discharged.    Special note: Every effort is made to have your procedure done on time. Please understand that emergencies sometimes delay scheduled procedures.  2. Diet: Do not eat solid foods after midnight.  The patient may have clear liquids until 5am upon the day of the procedure.  3. Labs: Labs done at OV 5/8  4. Medication instructions in preparation for your procedure:  Take only 1/2 dose  units of insulin the night before your procedure. Do not take any insulin  on the day of the procedure.  Do not take Diabetes Med Glucophage (Metformin) on the day of the procedure and HOLD 48 HOURS AFTER THE PROCEDURE.  Hold Glipizide morning of procedure   On the morning of your procedure, take your Aspirin 81 mg and any morning medicines NOT listed above.  You may use sips of water.  5. Plan to go home the same day, you will only stay overnight if medically necessary. 6. Bring a current list of your medications and current insurance cards. 7. You MUST have a responsible person to drive you home. 8. Someone MUST be with you the first 24 hours after you arrive home or your discharge will be delayed. 9. Please wear clothes that are easy to get on and off and wear slip-on shoes.  Thank you for allowing [place] to care for you!   -- [facility]   Follow-Up: At [facility], you and your health needs are our priority.  As part of our continuing mission to provide you with exceptional heart care, we have created designated Provider Care Teams.  These Care Teams include your primary Cardiologist (physician) and Advanced Practice Providers (APPs -  Physician Assistants and Nurse Practitioners) who all work together to provide you with the care you need, when you need it.  We recommend signing up for the patient portal called "MyChart".  Sign up information is provided on this After Visit Summary.  MyChart is used to connect with patients for Virtual Visits (Telemedicine).  Patients are able to view lab/test results, encounter notes, upcoming appointments, etc.  Non-urgent messages can be sent to your provider as well.   To learn more about what you can do with MyChart, go to [email or web address].    Your next appointment:   2 week(s) post cath   Provider:   Donato Schultz, MD  or APP     I,Rachel Rivera,acting as a scribe for Donato Schultz, MD.,have documented all relevant documentation on the behalf of Donato Schultz, MD,as directed  by  Donato Schultz, MD while in the presence of [facility]  Anne Fu, MD.  I, Donato Schultz, MD, have reviewed all documentation for this visit. The documentation on 09/11/22 for the exam, diagnosis, procedures, and orders are all accurate and complete.   Signed, Donato Schultz, MD  09/11/2022 4:35 PM    [facility]

## 2022-09-17 ENCOUNTER — Telehealth: Payer: Self-pay | Admitting: *Deleted

## 2022-09-17 NOTE — Telephone Encounter (Signed)
Cardiac Catheterization scheduled at [facility] for: Wednesday Sep 18, 2022 10 AM Arrival time [facility] Main Entrance A at: 8 AM  Nothing to eat after midnight prior to procedure, clear liquids until 5 AM day of procedure.  Medication instructions: -Hold:  Metformin-day of procedure and 48 hours post procedure  Glipizide-AM of procedure  Insulin-1/2 usual dose HS prior to procedure -Other usual morning medications can be taken with sips of water including aspirin 81 mg.  Confirmed patient has responsible adult to drive home post procedure and be with patient first 24 hours after arriving home.  Plan to go home the same day, you will only stay overnight if medically necessary.  Reviewed procedure instructions with patient.

## 2022-09-18 ENCOUNTER — Other Ambulatory Visit (HOSPITAL_COMMUNITY): Payer: Self-pay

## 2022-09-18 ENCOUNTER — Telehealth: Payer: Self-pay | Admitting: Cardiology

## 2022-09-18 ENCOUNTER — Encounter (HOSPITAL_COMMUNITY)
Admission: RE | Disposition: A | Discharge status: Home / Self Care | Payer: Self-pay | Source: Home / Self Care | Attending: Interventional Cardiology

## 2022-09-18 ENCOUNTER — Other Ambulatory Visit: Payer: Self-pay

## 2022-09-18 ENCOUNTER — Ambulatory Visit (HOSPITAL_COMMUNITY)
Admission: RE | Admit: 2022-09-18 | Discharge: 2022-09-18 | Disposition: A | Discharge status: Home / Self Care | Payer: Medicare Other | Attending: Interventional Cardiology | Admitting: Interventional Cardiology

## 2022-09-18 DIAGNOSIS — J84112 Idiopathic pulmonary fibrosis: Secondary | ICD-10-CM | POA: Diagnosis present

## 2022-09-18 DIAGNOSIS — I2584 Coronary atherosclerosis due to calcified coronary lesion: Secondary | ICD-10-CM | POA: Insufficient documentation

## 2022-09-18 DIAGNOSIS — Z95810 Presence of automatic (implantable) cardiac defibrillator: Secondary | ICD-10-CM | POA: Insufficient documentation

## 2022-09-18 DIAGNOSIS — E1151 Type 2 diabetes mellitus with diabetic peripheral angiopathy without gangrene: Secondary | ICD-10-CM | POA: Insufficient documentation

## 2022-09-18 DIAGNOSIS — I2510 Atherosclerotic heart disease of native coronary artery without angina pectoris: Secondary | ICD-10-CM

## 2022-09-18 DIAGNOSIS — I714 Abdominal aortic aneurysm, without rupture, unspecified: Secondary | ICD-10-CM | POA: Diagnosis present

## 2022-09-18 DIAGNOSIS — I5022 Chronic systolic (congestive) heart failure: Secondary | ICD-10-CM | POA: Diagnosis present

## 2022-09-18 DIAGNOSIS — E785 Hyperlipidemia, unspecified: Secondary | ICD-10-CM | POA: Diagnosis present

## 2022-09-18 DIAGNOSIS — Z87891 Personal history of nicotine dependence: Secondary | ICD-10-CM | POA: Diagnosis not present

## 2022-09-18 DIAGNOSIS — Z955 Presence of coronary angioplasty implant and graft: Secondary | ICD-10-CM

## 2022-09-18 DIAGNOSIS — I447 Left bundle-branch block, unspecified: Secondary | ICD-10-CM | POA: Diagnosis present

## 2022-09-18 DIAGNOSIS — I442 Atrioventricular block, complete: Secondary | ICD-10-CM | POA: Diagnosis not present

## 2022-09-18 DIAGNOSIS — Z8249 Family history of ischemic heart disease and other diseases of the circulatory system: Secondary | ICD-10-CM | POA: Insufficient documentation

## 2022-09-18 DIAGNOSIS — Z7984 Long term (current) use of oral hypoglycemic drugs: Secondary | ICD-10-CM | POA: Insufficient documentation

## 2022-09-18 DIAGNOSIS — N183 Chronic kidney disease, stage 3 unspecified: Secondary | ICD-10-CM | POA: Diagnosis not present

## 2022-09-18 DIAGNOSIS — I130 Hypertensive heart and chronic kidney disease with heart failure and stage 1 through stage 4 chronic kidney disease, or unspecified chronic kidney disease: Secondary | ICD-10-CM | POA: Insufficient documentation

## 2022-09-18 DIAGNOSIS — I10 Essential (primary) hypertension: Secondary | ICD-10-CM | POA: Diagnosis present

## 2022-09-18 DIAGNOSIS — I2582 Chronic total occlusion of coronary artery: Secondary | ICD-10-CM | POA: Diagnosis not present

## 2022-09-18 DIAGNOSIS — Z794 Long term (current) use of insulin: Secondary | ICD-10-CM | POA: Insufficient documentation

## 2022-09-18 DIAGNOSIS — I25118 Atherosclerotic heart disease of native coronary artery with other forms of angina pectoris: Secondary | ICD-10-CM | POA: Diagnosis not present

## 2022-09-18 DIAGNOSIS — E1159 Type 2 diabetes mellitus with other circulatory complications: Secondary | ICD-10-CM | POA: Diagnosis present

## 2022-09-18 DIAGNOSIS — Z833 Family history of diabetes mellitus: Secondary | ICD-10-CM | POA: Insufficient documentation

## 2022-09-18 DIAGNOSIS — I739 Peripheral vascular disease, unspecified: Secondary | ICD-10-CM | POA: Diagnosis present

## 2022-09-18 DIAGNOSIS — I25112 Atherosclerotic heart disease of native coronary artery with refractory angina pectoris: Secondary | ICD-10-CM

## 2022-09-18 DIAGNOSIS — E1122 Type 2 diabetes mellitus with diabetic chronic kidney disease: Secondary | ICD-10-CM | POA: Insufficient documentation

## 2022-09-18 DIAGNOSIS — E1169 Type 2 diabetes mellitus with other specified complication: Secondary | ICD-10-CM | POA: Diagnosis present

## 2022-09-18 HISTORY — PX: LEFT HEART CATH AND CORONARY ANGIOGRAPHY: CATH118249

## 2022-09-18 HISTORY — PX: CORONARY ULTRASOUND/IVUS: CATH118244

## 2022-09-18 HISTORY — PX: CORONARY LITHOTRIPSY: CATH118330

## 2022-09-18 HISTORY — PX: CORONARY STENT INTERVENTION: CATH118234

## 2022-09-18 LAB — GLUCOSE, CAPILLARY: Glucose-Capillary: 153 mg/dL — ABNORMAL HIGH (ref 70–99)

## 2022-09-18 LAB — POCT ACTIVATED CLOTTING TIME
Activated Clotting Time: 271 seconds
Activated Clotting Time: 320 seconds
Activated Clotting Time: 314 seconds

## 2022-09-18 SURGERY — LEFT HEART CATH AND CORONARY ANGIOGRAPHY
Anesthesia: LOCAL

## 2022-09-18 MED ORDER — ROSUVASTATIN CALCIUM 20 MG PO TABS
20.0000 mg | ORAL_TABLET | Freq: Every day | ORAL | 5 refills | Status: DC
  Filled 2022-09-18: qty 30, 30d supply, fill #0

## 2022-09-18 MED ORDER — VERAPAMIL HCL 2.5 MG/ML IV SOLN
INTRAVENOUS | Status: AC
  Filled 2022-09-18: qty 2

## 2022-09-18 MED ORDER — SODIUM CHLORIDE 0.9% FLUSH: 3.0000 mL | Freq: Two times a day (BID) | INTRAVENOUS | Status: DC

## 2022-09-18 MED ORDER — ACETAMINOPHEN 325 MG PO TABS: 650.0000 mg | ORAL_TABLET | ORAL | Status: DC | PRN

## 2022-09-18 MED ORDER — SODIUM CHLORIDE 0.9% FLUSH: 3.0000 mL | INTRAVENOUS | Status: DC | PRN

## 2022-09-18 MED ORDER — IOHEXOL 350 MG/ML SOLN
INTRAVENOUS | Status: DC | PRN
  Administered 2022-09-18: 120 mL

## 2022-09-18 MED ORDER — VERAPAMIL HCL 2.5 MG/ML IV SOLN
INTRAVENOUS | Status: DC | PRN
  Administered 2022-09-18: 10 mL via INTRA_ARTERIAL

## 2022-09-18 MED ORDER — SODIUM CHLORIDE 0.9 % IV SOLN: INTRAVENOUS | Status: AC

## 2022-09-18 MED ORDER — HEPARIN SODIUM (PORCINE) 1000 UNIT/ML IJ SOLN
INTRAMUSCULAR | Status: DC | PRN
  Administered 2022-09-18: 6000 [IU] via INTRAVENOUS
  Administered 2022-09-18: 2000 [IU] via INTRAVENOUS
  Administered 2022-09-18 (×2): 3000 [IU] via INTRAVENOUS

## 2022-09-18 MED ORDER — HEPARIN SODIUM (PORCINE) 1000 UNIT/ML IJ SOLN
INTRAMUSCULAR | Status: AC
  Filled 2022-09-18: qty 10

## 2022-09-18 MED ORDER — CLOPIDOGREL BISULFATE 300 MG PO TABS
ORAL_TABLET | ORAL | Status: AC
  Filled 2022-09-18: qty 1

## 2022-09-18 MED ORDER — LIDOCAINE HCL (PF) 1 % IJ SOLN
INTRAMUSCULAR | Status: DC | PRN
  Administered 2022-09-18: 2 mL via INTRADERMAL

## 2022-09-18 MED ORDER — FENTANYL CITRATE (PF) 100 MCG/2ML IJ SOLN
INTRAMUSCULAR | Status: AC
  Filled 2022-09-18: qty 2

## 2022-09-18 MED ORDER — FENTANYL CITRATE (PF) 100 MCG/2ML IJ SOLN
INTRAMUSCULAR | Status: DC | PRN
  Administered 2022-09-18 (×2): 25 ug via INTRAVENOUS

## 2022-09-18 MED ORDER — LIDOCAINE HCL (PF) 1 % IJ SOLN
INTRAMUSCULAR | Status: AC
  Filled 2022-09-18: qty 30

## 2022-09-18 MED ORDER — ASPIRIN 81 MG PO CHEW: 81.0000 mg | CHEWABLE_TABLET | ORAL | Status: DC

## 2022-09-18 MED ORDER — LABETALOL HCL 5 MG/ML IV SOLN: 10.0000 mg | INTRAVENOUS | Status: DC | PRN

## 2022-09-18 MED ORDER — SODIUM CHLORIDE 0.9 % WEIGHT BASED INFUSION: 1.0000 mL/kg/h | INTRAVENOUS | Status: DC

## 2022-09-18 MED ORDER — MIDAZOLAM HCL 2 MG/2ML IJ SOLN
INTRAMUSCULAR | Status: AC
  Filled 2022-09-18: qty 2

## 2022-09-18 MED ORDER — MIDAZOLAM HCL 2 MG/2ML IJ SOLN
INTRAMUSCULAR | Status: DC | PRN
  Administered 2022-09-18: 1 mg via INTRAVENOUS
  Administered 2022-09-18: 2 mg via INTRAVENOUS

## 2022-09-18 MED ORDER — NITROGLYCERIN 1 MG/10 ML FOR IR/CATH LAB
INTRA_ARTERIAL | Status: AC
  Filled 2022-09-18: qty 10

## 2022-09-18 MED ORDER — SODIUM CHLORIDE 0.9 % IV SOLN: 250.0000 mL | INTRAVENOUS | Status: DC | PRN

## 2022-09-18 MED ORDER — CLOPIDOGREL BISULFATE 300 MG PO TABS
ORAL_TABLET | ORAL | Status: DC | PRN
  Administered 2022-09-18: 600 mg via ORAL

## 2022-09-18 MED ORDER — CLOPIDOGREL BISULFATE 75 MG PO TABS: 75.0000 mg | ORAL_TABLET | Freq: Every day | ORAL | Status: DC

## 2022-09-18 MED ORDER — ASPIRIN 81 MG PO TBEC
81.0000 mg | DELAYED_RELEASE_TABLET | Freq: Every day | ORAL | 2 refills | Status: DC
  Filled 2022-09-18: qty 120, 120d supply, fill #0

## 2022-09-18 MED ORDER — HYDRALAZINE HCL 20 MG/ML IJ SOLN: 10.0000 mg | INTRAMUSCULAR | Status: DC | PRN

## 2022-09-18 MED ORDER — PANTOPRAZOLE SODIUM 40 MG PO TBEC
80.0000 mg | DELAYED_RELEASE_TABLET | Freq: Every day | ORAL | 1 refills | Status: DC
  Filled 2022-09-18: qty 60, 30d supply, fill #0

## 2022-09-18 MED ORDER — ASPIRIN 81 MG PO CHEW: 81.0000 mg | CHEWABLE_TABLET | Freq: Every day | ORAL | Status: DC

## 2022-09-18 MED ORDER — HEPARIN (PORCINE) IN NACL 1000-0.9 UT/500ML-% IV SOLN
INTRAVENOUS | Status: DC | PRN
  Administered 2022-09-18 (×2): 500 mL

## 2022-09-18 MED ORDER — CLOPIDOGREL BISULFATE 75 MG PO TABS
75.0000 mg | ORAL_TABLET | Freq: Every day | ORAL | 11 refills | Status: DC
  Filled 2022-09-18: qty 30, 30d supply, fill #0

## 2022-09-18 MED ORDER — SODIUM CHLORIDE 0.9 % WEIGHT BASED INFUSION
3.0000 mL/kg/h | INTRAVENOUS | Status: AC
  Administered 2022-09-18: 3 mL/kg/h via INTRAVENOUS

## 2022-09-18 MED ORDER — ONDANSETRON HCL 4 MG/2ML IJ SOLN: 4.0000 mg | Freq: Four times a day (QID) | INTRAMUSCULAR | Status: DC | PRN

## 2022-09-18 SURGICAL SUPPLY — 21 items
BALL SAPPHIRE NC24 3.75X8 (BALLOONS) ×1
BALLOON SAPPHIRE NC24 3.75X8 (BALLOONS) IMPLANT
CATH 5FR JL3.5 JR4 ANG PIG MP (CATHETERS) IMPLANT
CATH INFINITI 5FR AL1 (CATHETERS) IMPLANT
CATH LAUNCHER 6FR EBU 3.75 (CATHETERS) IMPLANT
CATH OPTICROSS HD (CATHETERS) IMPLANT
CATH SHOCKWAVE C2 3.5X12 (CATHETERS) IMPLANT
DEVICE RAD COMP TR BAND LRG (VASCULAR PRODUCTS) IMPLANT
GLIDESHEATH SLEND SS 6F .021 (SHEATH) IMPLANT
GUIDEWIRE INQWIRE 1.5J.035X260 (WIRE) IMPLANT
INQWIRE 1.5J .035X260CM (WIRE) ×1
KIT ENCORE 26 ADVANTAGE (KITS) IMPLANT
KIT HEART LEFT (KITS) ×1 IMPLANT
KIT HEMO VALVE WATCHDOG (MISCELLANEOUS) IMPLANT
PACK CARDIAC CATHETERIZATION (CUSTOM PROCEDURE TRAY) ×1 IMPLANT
SLED PULL BACK IVUS (MISCELLANEOUS) IMPLANT
STENT SYNERGY XD 3.50X12 (Permanent Stent) IMPLANT
SYNERGY XD 3.50X12 (Permanent Stent) ×1 IMPLANT
TRANSDUCER W/STOPCOCK (MISCELLANEOUS) ×1 IMPLANT
TUBING CIL FLEX 10 FLL-RA (TUBING) ×1 IMPLANT
WIRE RUNTHROUGH .014X180CM (WIRE) IMPLANT

## 2022-09-18 NOTE — Progress Notes (Signed)
CARDIAC REHAB PHASE I   Post stent education including site care, restrictions, risk factors, antiplatelet therapy importance, exercise guidelines, heart healthy diabetic diet and CRP2 reviewed. All questions and concerns addressed. Will refer to [facility] for CRP2. Prior to my departure from room, pt radial site began to bleed heavily. Manual pressure was held until short stay come to room to assess. Pt left in care of short stay RN.  [id number]   Woodroe Chen, RN BSN 09/18/2022 2:44 PM

## 2022-09-18 NOTE — Interval H&P Note (Signed)
Cath Lab Visit (complete for each Cath Lab visit)  Clinical Evaluation Leading to the Procedure:   ACS: No.  Non-ACS:    Anginal Classification: CCS III  Anti-ischemic medical therapy: Minimal Therapy (1 class of medications)  Non-Invasive Test Results: No non-invasive testing performed  Prior CABG: No previous CABG  Known calcified mid LAD lesion with prior CTO of distal RCA.     History and Physical Interval Note:  09/18/2022 10:19 AM  Luke [name]  has presented today for surgery, with the diagnosis of CAD.  The various methods of treatment have been discussed with the patient and family. After consideration of risks, benefits and other options for treatment, the patient has consented to  Procedure(s): LEFT HEART CATH AND CORONARY ANGIOGRAPHY (N/A) as a surgical intervention.  The patient's history has been reviewed, patient examined, no change in status, stable for surgery.  I have reviewed the patient's chart and labs.  Questions were answered to the patient's satisfaction.     Lance Muss

## 2022-09-18 NOTE — Discharge Instructions (Signed)
Radial Site Care  This sheet gives you information about how to care for yourself after your procedure. Your health care provider may also give you more specific instructions. If you have problems or questions, contact your health care provider. What can I expect after the procedure? After the procedure, it is common to have: Bruising and tenderness at the catheter insertion area. Follow these instructions at home: Medicines Take over-the-counter and prescription medicines only as told by your health care provider. Insertion site care Follow instructions from your health care provider about how to take care of your insertion site. Make sure you: Wash your hands with soap and water before you remove your bandage (dressing). If soap and water are not available, use hand sanitizer. May remove dressing in 24 hours. Check your insertion site every day for signs of infection. Check for: Redness, swelling, or pain. Fluid or blood. Pus or a bad smell. Warmth. Do no take baths, swim, or use a hot tub for 5 days. You may shower 24-48 hours after the procedure. Remove the dressing and gently wash the site with plain soap and water. Pat the area dry with a clean towel. Do not rub the site. That could cause bleeding. Do not apply powder or lotion to the site. Activity  For 24 hours after the procedure, or as directed by your health care provider: Do not flex or bend the affected arm. Do not push or pull heavy objects with the affected arm. Do not drive yourself home from the hospital or clinic. You may drive 24 hours after the procedure. Do not operate machinery or power tools. KEEP ARM ELEVATED THE REMAINDER OF THE DAY. Do not push, pull or lift anything that is heavier than 10 lb for 5 days. Ask your health care provider when it is okay to: Return to work or school. Resume usual physical activities or sports. Resume sexual activity. General instructions If the catheter site starts to  bleed, raise your arm and put firm pressure on the site. If the bleeding does not stop, get help right away. This is a medical emergency. DRINK PLENTY OF FLUIDS FOR THE NEXT 2-3 DAYS. No alcohol consumption for 24 hours after receiving sedation. If you went home on the same day as your procedure, a responsible adult should be with you for the first 24 hours after you arrive home. Keep all follow-up visits as told by your health care provider. This is important. Contact a health care provider if: You have a fever. You have redness, swelling, or yellow drainage around your insertion site. Get help right away if: You have unusual pain at the radial site. The catheter insertion area swells very fast. The insertion area is bleeding, and the bleeding does not stop when you hold steady pressure on the area. Your arm or hand becomes pale, cool, tingly, or numb. These symptoms may represent a serious problem that is an emergency. Do not wait to see if the symptoms will go away. Get medical help right away. Call your local emergency services (911 in the [place] Do not drive yourself to the hospital. Summary After the procedure, it is common to have bruising and tenderness at the site. Follow instructions from your health care provider about how to take care of your radial site wound. Check the wound every day for signs of infection.  This information is not intended to replace advice given to you by your health care provider. Make sure you discuss any questions you have with   your health care provider. Document Revised: 05/28/2017 Document Reviewed: 05/28/2017 Elsevier Patient Education  2020 [employer]  

## 2022-09-18 NOTE — Discharge Summary (Cosign Needed)
Discharge Summary for Same Day PCI   Patient ID: Luke [name] MRN: [id number]; DOB: [birth year]  Admit date: 09/18/2022 Discharge date: 09/18/2022  Primary Care Provider: [name], MD  Primary Cardiologist: Donato Schultz, MD  Primary Electrophysiologist:  None   Discharge Diagnoses    Principal Problem:   CAD (coronary artery disease) Active Problems:   Type 2 diabetes mellitus with vascular disease (HCC)   Essential hypertension, benign   AAA (abdominal aortic aneurysm) (HCC)   PVD (peripheral vascular disease) (HCC)   LBBB (left bundle branch block)   HLD (hyperlipidemia)   Chronic HFrEF (heart failure with reduced ejection fraction) (HCC)   IPF (idiopathic pulmonary fibrosis) (HCC)   CKD (chronic kidney disease) stage 3, GFR 30-59 ml/min (HCC)    Diagnostic Studies/Procedures    Cardiac Catheterization 09/18/2022: Mid RCA to Dist RCA lesion is 50% stenosed.   Prox RCA lesion is 75% stenosed.   Mid RCA lesion is 25% stenosed.   Dist RCA lesion is 99% stenosed.   Prox LAD to Mid LAD lesion is 80% stenosed.   After IVL with 3.5 x 12mm Shockwave,  drug-eluting stent was successfully placed using a SYNERGY XD 3.50X12, postdilated to > 3.75 mm and optimized with IVUS.   Post intervention, there is a 0% residual stenosis.   The left ventricular systolic function is normal.   LV end diastolic pressure is normal.   The left ventricular ejection fraction is 55-65% by visual estimate.   There is no aortic valve stenosis.   Successful IVL followed by stent placement of LAD.   CTO of distal RCA, now with a channel which may facilitate CTO PCI.  Plan same day discharge.   Echocardiogram 07/17/2021  1. Left ventricular ejection fraction, by estimation, is 40 to 45%. The  left ventricle has mildly decreased function. The left ventricle  demonstrates regional wall motion abnormalities (see scoring  diagram/findings for description). Left ventricular  diastolic parameters  are consistent with Grade I diastolic dysfunction  (impaired relaxation).   2. Right ventricular systolic function is normal. The right ventricular  size is mildly enlarged. There is normal pulmonary artery systolic  pressure.   3. The mitral valve is normal in structure. Trivial mitral valve  regurgitation.   4. The aortic valve is tricuspid. There is mild calcification of the  aortic valve. There is mild thickening of the aortic valve. Aortic valve  regurgitation is not visualized. Aortic valve sclerosis is present, with  no evidence of aortic valve stenosis.   Comparison(s): Prior images unable to be directly viewed, comparison made  by report only. The left ventricular function is worsened. The left  ventricular wall motion abnormalities are new.  _____________   History of Present Illness     Luke [name] is a 76 y.o. male with CAD (distal RCA CTO with collaterals from circumflex and LAD moderate to severe disease), chronic HFrEF, complete heart block with ICD/CRT, idiopathic pulmonary fibrosis, PVD.  Patient has had prior cardiac catheterization in 2013 and 2023 with medically managed distal RCA CTO with collaterals from circumflex and LAD moderate to severe that were highly calcified.  Echocardiogram in March 2023 indicated LVEF of 40 to 45% with regional wall motion abnormalities.  GDMT has been limited by patient's intolerance to Entresto, spironolactone, and Jardiance.  Patient is also seen by Dr. 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  Recently he has been having new anginal complaints and decreased exercise tolerance.Cardiac  catheterization was arranged for further evaluation.  Hospital Course     The patient underwent cardiac cath as noted above with successful DES to the LAD, with new channel which may facilitate CTO PCI.  Plan for DAPT with ASA/Plavix 75 mg daily for at least 12 months. The patient was seen by cardiac rehab  while in short stay. There were no observed complications post cath. Radial R cath site was re-evaluated prior to discharge and found to be stable without any complications. Instructions/precautions regarding cath site care were given prior to discharge.  [name] was seen by Dr. Eldridge Dace and determined stable for discharge home. Follow up with our office has been arranged. Medications are listed below. Pertinent changes include switching to protonix 40mg  to minimize bleeding risk.  Switch simvastatin to rosuvastatin 20mg  due to drug interaction between simvastatin and amlodipine. Has history of being intolerant to statins so will monitor closely for myalgias. _____________  Cath/PCI Registry Performance & Quality Measures: Aspirin prescribed? - Yes ADP Receptor Inhibitor (Plavix/Clopidogrel, Brilinta/Ticagrelor or Effient/Prasugrel) prescribed (includes medically managed patients)? - Yes High Intensity Statin (Lipitor 40-80mg  or Crestor 20-40mg ) prescribed? - Yes For EF <40%, was ACEI/ARB prescribed? - Yes For EF <40%, Aldosterone Antagonist (Spironolactone or Eplerenone) prescribed? - No - Reason:  not able to tolerate Cardiac Rehab Phase II ordered (Included Medically managed Patients)? - Yes  _____________   Discharge Vitals Blood pressure (!) 163/83, pulse 63, temperature 98.3 F (36.8 C), temperature source Oral, resp. rate 17, height 6\' 2"  (1.88 m), weight 82.6 kg, SpO2 94 %.  Filed Weights   09/18/22 0815  Weight: 82.6 kg    Last Labs & Radiologic Studies    CBC No results for input(s): "WBC", "NEUTROABS", "HGB", "HCT", "MCV", "PLT" in the last 72 hours. Basic Metabolic Panel No results for input(s): "NA", "K", "CL", "CO2", "GLUCOSE", "BUN", "CREATININE", "CALCIUM", "MG", "PHOS" in the last 72 hours. Liver Function Tests No results for input(s): "AST", "ALT", "ALKPHOS", "BILITOT", "PROT", "ALBUMIN" in the last 72 hours. No results for input(s): "LIPASE", "AMYLASE" in  the last 72 hours. High Sensitivity Troponin:   No results for input(s): "TROPONINIHS" in the last 720 hours.  BNP Invalid input(s): "POCBNP" D-Dimer No results for input(s): "DDIMER" in the last 72 hours. Hemoglobin A1C No results for input(s): "HGBA1C" in the last 72 hours. Fasting Lipid Panel No results for input(s): "CHOL", "HDL", "LDLCALC", "TRIG", "CHOLHDL", "LDLDIRECT" in the last 72 hours. Thyroid Function Tests No results for input(s): "TSH", "T4TOTAL", "T3FREE", "THYROIDAB" in the last 72 hours.  Invalid input(s): "FREET3" _____________  CARDIAC CATHETERIZATION  Result Date: 09/18/2022   Mid RCA to Dist RCA lesion is 50% stenosed.   Prox RCA lesion is 75% stenosed.   Mid RCA lesion is 25% stenosed.   Dist RCA lesion is 99% stenosed.   Prox LAD to Mid LAD lesion is 80% stenosed.   After IVL with 3.5 x 12mm Shockwave,  drug-eluting stent was successfully placed using a SYNERGY XD 3.50X12, postdilated to > 3.75 mm and optimized with IVUS.   Post intervention, there is a 0% residual stenosis.   The left ventricular systolic function is normal.   LV end diastolic pressure is normal.   The left ventricular ejection fraction is 55-65% by visual estimate.   There is no aortic valve stenosis. Successful IVL followed by stent placement of LAD.  CTO of distal RCA, now with a channel which may facilitate CTO PCI. Plan same day discharge.   DG Foot Complete  Right  Result Date: 09/02/2022 Please see detailed radiograph report in office note.   Disposition   Pt is being discharged home today in good condition.  Follow-up Plans & Appointments     Follow-up Information     Alver Sorrow, NP Follow up.   Specialty: Cardiology Why: Your follow up appointment is at 10/02/2022 10:05am. Please be at least 15 min early before your appointment. Contact information: [address] [place] [address] [phone]                Discharge Instructions     Amb Referral to  Cardiac Rehabilitation   Complete by: As directed    Diagnosis: Coronary Stents   After initial evaluation and assessments completed: Virtual Based Care may be provided alone or in conjunction with Phase 2 Cardiac Rehab based on patient barriers.: Yes   Intensive Cardiac Rehabilitation (ICR) [facility] location only OR Traditional Cardiac Rehabilitation (TCR) *If criteria for ICR are not met will enroll in TCR [facility] only): Yes   Diet - low sodium heart healthy   Complete by: As directed    Discharge instructions   Complete by: As directed    !!! IMPORTANT: After a heart catheterization, you will need to avoid taking metformin for at least 48 hours. We would recommend you restart this on 09/21/2022.  Some studies suggest omeprazole/Prilosec (acid reflux medicine) interacts with clopidogrel/Plavix (blood thinner). We changed your omeprazole/Prilosec to the equivalent dose of pantoprazole/Protonix for less chance of interaction. Please discuss long term plan for this class of medicine with your primary care provider.  See med list for other changes made.   Increase activity slowly   Complete by: As directed    See end of after visit summary for guidelines on activity and wound care.        Discharge Medications   Allergies as of 09/18/2022       Reactions   Aldactone [spironolactone] Other (See Comments)   Hyperkalemia   Codeine Rash, Hives   Atorvastatin    Myalgias with lipitor.  Does tolerate simvastatin.     Januvia [sitagliptin] Other (See Comments)   Diarrhea and heart racing   Jardiance [empagliflozin] Other (See Comments)   Polyuria; excessive weight loss   Lisinopril    Possible cause of pancreatitis        Medication List     STOP taking these medications    omeprazole 40 MG capsule Commonly known as: PRILOSEC   simvastatin 40 MG tablet Commonly known as: ZOCOR       TAKE these medications    Accu-Chek Aviva Plus test strip Generic drug: glucose blood AS  DIRECTED TO CHECK BLOOD SUGAR TWICE DAILY   albuterol 108 (90 Base) MCG/ACT inhaler Commonly known as: VENTOLIN HFA Inhale 2 puffs into the lungs every 6 (six) hours as needed for wheezing or shortness of breath.   amLODipine 10 MG tablet Commonly known as: NORVASC TAKE 1 TABLET(10 MG) BY MOUTH DAILY What changed: See the new instructions.   aspirin EC 81 MG tablet Take 1 tablet (81 mg total) by mouth daily. What changed:  medication strength how much to take when to take this   carvedilol 25 MG tablet Commonly known as: COREG TAKE 1 TABLET BY MOUTH TWICE DAILY WITH MEALS   clopidogrel 75 MG tablet Commonly known as: PLAVIX Take 1 tablet (75 mg total) by mouth daily with breakfast. Start taking on: Sep 19, 2022   Esbriet 267 MG Tabs Generic drug:  Pirfenidone Take 2 tablets (534 mg total) by mouth in the morning, at noon, and at bedtime. **NOTE LOW DOSE AS MAINTENANCE**   famotidine 20 MG tablet Commonly known as: PEPCID Take 1 tablet (20 mg total) by mouth at bedtime.   glipiZIDE 5 MG tablet Commonly known as: GLUCOTROL TAKE 1 TABLET(5 MG) BY MOUTH TWICE DAILY BEFORE A MEAL   Incruse Ellipta 62.5 MCG/ACT Aepb Generic drug: umeclidinium bromide Inhale 1 puff into the lungs daily.   isosorbide mononitrate 30 MG 24 hr tablet Commonly known as: IMDUR Take 1 tablet (30 mg total) by mouth daily. What changed: when to take this   Lantus SoloStar 100 UNIT/ML Solostar Pen Generic drug: insulin glargine INJECT 0.3 TO 0.35 MLS(30 TO 35 UNITS) INTO THE SKIN EVERY DAY What changed:  how much to take how to take this when to take this additional instructions   metFORMIN 500 MG tablet Commonly known as: GLUCOPHAGE TAKE 2 TABLETS BY MOUTH TWICE DAILY WITH FOOD Notes to patient: !!! IMPORTANT: After a heart catheterization, you will need to avoid taking metformin for at least 48 hours. We would recommend you restart this on 09/21/2022.   multivitamin with minerals Tabs  tablet Take 1 tablet by mouth in the morning.   pantoprazole 40 MG tablet Commonly known as: Protonix Take 2 tablets (80 mg total) by mouth daily.   rosuvastatin 20 MG tablet Commonly known as: Crestor Take 1 tablet (20 mg total) by mouth daily.   valsartan 40 MG tablet Commonly known as: DIOVAN Take 20 mg by mouth daily with supper.   VITAMIN B-12 PO Take 2,000 mcg by mouth in the morning.   Vitamin D3 50 MCG (2000 UT) Tabs Take 2,000 Units by mouth in the morning.           Allergies Allergies  Allergen Reactions   Aldactone [Spironolactone] Other (See Comments)    Hyperkalemia   Codeine Rash and Hives   Atorvastatin     Myalgias with lipitor.  Does tolerate simvastatin.     Januvia [Sitagliptin] Other (See Comments)    Diarrhea and heart racing   Jardiance [Empagliflozin] Other (See Comments)    Polyuria; excessive weight loss   Lisinopril     Possible cause of pancreatitis    Outstanding Labs/Studies   Check lipid panel outpatient   Duration of Discharge Encounter   Greater than 30 minutes including physician time.  Signed, Abagail Kitchens, PA-C 09/18/2022, 5:14 PM   I have examined the patient and reviewed assessment and plan and discussed with patient.  Agree with above as stated.   Continue DAPT.  Plan for DAPT.  Will plan for PCI of RCA CTO.  Ultimately will need right fem pop bypass.   Lance Muss

## 2022-09-18 NOTE — Telephone Encounter (Signed)
   Transition of Care Follow-up Phone Call Request    Patient Name: Luke [name] Date of Birth: [birth year] Date of Encounter: 09/18/2022  Primary Care Provider:  [name], MD Primary Cardiologist:  Donato Schultz, MD  [name] has been scheduled for a transition of care follow up appointment with a [facility] provider:  Gillian Shields on 10/02/2022.    Please reach out to [name] within 48 hours to confirm appointment.  Abagail Kitchens, PA-C  09/18/2022, 2:27 PM

## 2022-09-18 NOTE — Telephone Encounter (Signed)
Patient had outpatient Cath today, plan same day home  Has follow up with Ronn Melena NP 5/29

## 2022-09-19 ENCOUNTER — Telehealth: Payer: Self-pay

## 2022-09-19 ENCOUNTER — Telehealth: Payer: Self-pay | Admitting: Cardiology

## 2022-09-19 ENCOUNTER — Encounter (HOSPITAL_COMMUNITY): Payer: Self-pay | Admitting: Interventional Cardiology

## 2022-09-19 ENCOUNTER — Telehealth: Payer: Self-pay | Admitting: *Deleted

## 2022-09-19 NOTE — Telephone Encounter (Signed)
Caller stated they will need to get a copy of patient's cath report.

## 2022-09-19 NOTE — Telephone Encounter (Signed)
-----   Message from Corky Crafts, MD sent at 09/19/2022 12:46 PM EDT ----- Luke [name], Please schedule him for CTO PCI of RCA for June 6.  I discussed the case with Dr Anne Fu and we will plan to fix his RCA prior to fem-pop bypass with Dr. Edilia Bo.  I discussed with the patient and his wife after his cath/PCI yesterday.  Thanks. JV ----- Message ----- From: Jake Bathe, MD Sent: 09/11/2022   4:34 PM EDT To: Corky Crafts, MD  Vonna Kotyk, I am sending Luke [name] to you for orbital atherectomy of LAD and stenting due to refractory anginal symptoms.  Please see prior cardiac catheterization. Loraine Leriche

## 2022-09-19 NOTE — Telephone Encounter (Signed)
CTO scheduled for June 6 at 7:30. Patient made aware of date/time.  Patient aware instructions will be given to him for procedure at 5/29 office visit.  Lab work can also be done at this appointment

## 2022-09-19 NOTE — Telephone Encounter (Signed)
Attempted to contact [place] at # listed.  Left voice mail for her at her extension that this office is unable to send this to her without written consent from the pt.  Requested if she is needing this for possible surgical clearance to please fax a clearance request to the office.

## 2022-09-19 NOTE — Telephone Encounter (Signed)
Called and spoke with patient following cardiac cath 5/15 Stated he is doing well, no issues  Aware of follow up appointment date, time, and location

## 2022-09-19 NOTE — Telephone Encounter (Signed)
   Pre-operative Risk Assessment    Patient Name: Luke [name]  DOB: 10/10/1945 MRN: [id number]      Request for Surgical Clearance    Procedure:   Vitrectomy Surgery Right Eye  Date of Surgery:  Clearance TBD    Urgent per clearance request                              Surgeon:  Dr. Stephannie Li, MD Surgeon's Group or Practice Name:  [facility], [place] Phone number:  [phone] Fax number:  [phone]   Type of Clearance Requested:   - Medical  - Pharmacy:  Hold Clopidogrel (Plavix) , Aspirin   Type of Anesthesia:  MAC   Additional requests/questions:   N/A  Signed, Stevan Born   09/19/2022, 3:28 PM

## 2022-09-20 NOTE — Telephone Encounter (Signed)
Dr. Anne Fu, we have received a equest for urgent vitrectomy with re request to hold Plavix and aspirin.  Seeking your guidance in the setting of upcoming CTO intervention scheduled for 10/10/22.  Please route your response to p cv div preop.  Thank you, Marcelino Duster

## 2022-09-23 NOTE — Telephone Encounter (Signed)
   Primary Cardiologist: Donato Schultz, MD  Chart reviewed as part of pre-operative protocol coverage for Luke [name].  Per Dr. Anne Fu, primary cardiologist, patient is not recommended to stop Plavix or aspirin at this time due to recent LAD stent placement due to risk of stent thrombosis.  I will route this recommendation to the requesting party via Epic fax function and remove from pre-op pool.  Please call with questions.  Levi Aland, NP-C  09/23/2022, 5:24 AM [address] Office [phone] Fax [phone]

## 2022-09-24 ENCOUNTER — Telehealth (HOSPITAL_COMMUNITY): Payer: Self-pay

## 2022-09-24 NOTE — Telephone Encounter (Signed)
Attempted to call patient in regards to Cardiac Rehab - LM on VM 

## 2022-09-24 NOTE — Telephone Encounter (Signed)
Pt returned phone call and he is not interested in the cardiac rehab program. Closed referral. 

## 2022-10-02 ENCOUNTER — Ambulatory Visit (INDEPENDENT_AMBULATORY_CARE_PROVIDER_SITE_OTHER): Payer: Medicare Other | Admitting: Family

## 2022-10-02 ENCOUNTER — Encounter (HOSPITAL_BASED_OUTPATIENT_CLINIC_OR_DEPARTMENT_OTHER): Payer: Self-pay | Admitting: Family

## 2022-10-02 VITALS — BP 128/72 | HR 60 | Ht 74.0 in | Wt 183.0 lb

## 2022-10-02 DIAGNOSIS — I5022 Chronic systolic (congestive) heart failure: Secondary | ICD-10-CM

## 2022-10-02 DIAGNOSIS — I255 Ischemic cardiomyopathy: Secondary | ICD-10-CM

## 2022-10-02 DIAGNOSIS — J84112 Idiopathic pulmonary fibrosis: Secondary | ICD-10-CM

## 2022-10-02 DIAGNOSIS — I25118 Atherosclerotic heart disease of native coronary artery with other forms of angina pectoris: Secondary | ICD-10-CM

## 2022-10-02 DIAGNOSIS — E785 Hyperlipidemia, unspecified: Secondary | ICD-10-CM | POA: Diagnosis not present

## 2022-10-02 DIAGNOSIS — I447 Left bundle-branch block, unspecified: Secondary | ICD-10-CM | POA: Diagnosis not present

## 2022-10-02 DIAGNOSIS — I739 Peripheral vascular disease, unspecified: Secondary | ICD-10-CM

## 2022-10-02 DIAGNOSIS — Z95810 Presence of automatic (implantable) cardiac defibrillator: Secondary | ICD-10-CM

## 2022-10-02 NOTE — Telephone Encounter (Addendum)
Pt requested update on Incruse application. Contacted [employer], they initially said they still needed the Rx but then were able to find the Rx that was faxed. Then they said they need a copy of the Medicare Part D card. I don't have a copy of the patient's Part D card but did fax the Rx Benefit info from Epic - last year they accepted this. If that does not suffice this year, will need patient to bring his Medicare Part D card to the office to make a copy.

## 2022-10-02 NOTE — Patient Instructions (Addendum)
Medication Instructions:  Your physician recommends that you continue on your current medications as directed. Please refer to the Current Medication list given to you today.  *If you need a refill on your cardiac medications before your next appointment, please call your pharmacy*   Lab Work: Your physician recommends that you return for lab work today- BMP/CBC  If you have labs (blood work) drawn today and your tests are completely normal, you will receive your results only by: MyChart Message (if you have MyChart) OR A paper copy in the mail If you have any lab test that is abnormal or we need to change your treatment, we will call you to review the results.   Testing/Procedures: You are scheduled for a Cardiac Catheterization on Thursday, June 6 with Dr. Lance Muss.  1. Please arrive at the [facility] (Main Entrance A) at [facility]: [address] [place], [place] [address] at 5:30 AM (This time is 2 hour(s) before your procedure to ensure your preparation). Free valet parking service is available. You will check in at ADMITTING. The support person will be asked to wait in the waiting room.  It is OK to have someone drop you off and come back when you are ready to be discharged.    Special note: Every effort is made to have your procedure done on time. Please understand that emergencies sometimes delay scheduled procedures.  2. Diet: Do not eat solid foods after midnight.  The patient may have clear liquids until 5am upon the day of the procedure.  3. Labs: You will need to have blood drawn on Wednesday, May 29 at [facility]:  [address] [place], [place] [address] (lab is in the Primary Care office located on the 3rd floor).  Hours: 8:00 am - 4:30 pm (avoid 12:00 pm - 1:00 pm)  Phone: [phone].  Tell staff you are there for blood work and they will direct you to the lab.  You do not need an appointment. You do not need to be  fasting.  4. Medication instructions in preparation for your procedure:   Contrast Allergy: No   Take only 15 units of insulin the night before your procedure. Do not take any insulin on the day of the procedure.  Do not take Diabetes Med Glucophage (Metformin) on the day of the procedure and HOLD 48 HOURS AFTER THE PROCEDURE.  On the morning of your procedure, take your Aspirin 81 mg and Plavix/Clopidogrel and any morning medicines NOT listed above.  You may use sips of water.  5. Plan to go home the same day, you will only stay overnight if medically necessary. 6. Bring a current list of your medications and current insurance cards. 7. You MUST have a responsible person to drive you home. 8. Someone MUST be with you the first 24 hours after you arrive home or your discharge will be delayed. 9. Please wear clothes that are easy to get on and off and wear slip-on shoes.  Thank you for allowing [place] to care for you!   -- [facility]   Follow-Up: At [facility], you and your health needs are our priority.  As part of our continuing mission to provide you with exceptional heart care, we have created designated Provider Care Teams.  These Care Teams include your primary Cardiologist (physician) and Advanced Practice Providers (APPs -  Physician Assistants and Nurse Practitioners) who all work together to provide  you with the care you need, when you need it.  We recommend signing up for the patient portal called "MyChart".  Sign up information is provided on this After Visit Summary.  MyChart is used to connect with patients for Virtual Visits (Telemedicine).  Patients are able to view lab/test results, encounter notes, upcoming appointments, etc.  Non-urgent messages can be sent to your provider as well.   To learn more about what you can do with MyChart, go to [email or web address].    Your next appointment:   Please follow up with Gillian Shields,  NP between 6/14-6/21

## 2022-10-02 NOTE — H&P (View-Only) (Signed)
Office Visit    Patient Name: Luke [name] Date of Encounter: 10/03/2022  PCP:  [name], MD   [facility] Cardiologist:  Donato Schultz, MD  Advanced Practice Provider:  No care team member to display Electrophysiologist:  None      Chief Complaint    TIA MAGA is a 77 y.o. male presents today for follow up after [facility]   Past Medical History    Past Medical History:  Diagnosis Date   AAA (abdominal aortic aneurysm) (HCC) 2011   Per vascular surgery   Anginal pain (HCC) 2024   With exertion   Atrial fibrillation (HCC)    Automatic implantable cardioverter-defibrillator in situ    CAD (coronary artery disease)    Presumed CAD with nuclear scan October 09, 2011,  large anteroseptal MI and inferior MI. Catheterization scheduled October 15, 2011   Cardiomyopathy [facility])    Nuclear, October 09, 2011, EF 30%, multiple focal wall motion abnormalities   Chronic kidney disease    CKD3   Diabetes mellitus    type II   Drug therapy    Hyperkalemia with spironolactone   Dyspnea    Ejection fraction < 50%    EF 30%, nuclear, October 09, 2011   Ejection fraction < 50%    EF previously 30%  //   EF 30-35%, echo, July 14, 2012, severe diffuse hypokinesis, PA pressure 43 mm mercury   Fall due to ice or snow 06/24/2013   GERD (gastroesophageal reflux disease)    Silent   HLD (hyperlipidemia)    Hypertension    white coat HTN-- often elevated in office and controlled on outside checks.   ICD (implantable cardioverter-defibrillator) in place    CRT-D placed March, 2014 complete heart block and k dysfunction   IPF (idiopathic pulmonary fibrosis) (HCC)    LBBB (left bundle branch block)    LBBB on EKG October 11, 2011,  no prior EKG has been done   Low testosterone    Hx of   Myocardial infarction (HCC)    Told in 2015-never knew-showed up on stress test for AAA;    Pacemaker    PAD (peripheral artery disease) (HCC)    Pancreatitis    Past Surgical History:   Procedure Laterality Date   ABDOMINAL AORTAGRAM N/A 10/28/2011   Procedure: ABDOMINAL Ronny Flurry;  Surgeon: [name], MD;  Location: [facility] CATH LAB;  Service: Cardiovascular;  Laterality: N/A;   ABDOMINAL AORTIC ANEURYSM REPAIR     EVAR    ABDOMINAL AORTOGRAM N/A 05/27/2022   Procedure: ABDOMINAL AORTOGRAM;  Surgeon: Maeola Harman, MD;  Location: [facility] INVASIVE CV LAB;  Service: Cardiovascular;  Laterality: N/A;   BI-VENTRICULAR IMPLANTABLE CARDIOVERTER DEFIBRILLATOR N/A 07/16/2012   Procedure: BI-VENTRICULAR IMPLANTABLE CARDIOVERTER DEFIBRILLATOR  (CRT-D);  Surgeon: Duke Salvia, MD;  Location: [facility] CATH LAB;  Service: Cardiovascular;  Laterality: N/A;   BIV ICD GENERATOR CHANGEOUT N/A 10/25/2020   Procedure: BIV ICD GENERATOR CHANGEOUT;  Surgeon: Duke Salvia, MD;  Location: [facility] INVASIVE CV LAB;  Service: Cardiovascular;  Laterality: N/A;   CARDIAC CATHETERIZATION     CATARACT EXTRACTION Left 2023   COLONOSCOPY  03/23/2018; 2020   CORONARY LITHOTRIPSY N/A 09/18/2022   Procedure: CORONARY LITHOTRIPSY;  Surgeon: Corky Crafts, MD;  Location: [facility] INVASIVE CV LAB;  Service: Cardiovascular;  Laterality: N/A;   CORONARY STENT INTERVENTION N/A 09/18/2022   Procedure: CORONARY STENT INTERVENTION;  Surgeon: Corky Crafts, MD;  Location: [facility]  INVASIVE CV LAB;  Service: Cardiovascular;  Laterality: N/A;   CORONARY ULTRASOUND/IVUS N/A 09/18/2022   Procedure: Coronary Ultrasound/IVUS;  Surgeon: Corky Crafts, MD;  Location: [facility] INVASIVE CV LAB;  Service: Cardiovascular;  Laterality: N/A;   ESOPHAGOGASTRODUODENOSCOPY N/A 02/12/2019   Procedure: ESOPHAGOGASTRODUODENOSCOPY (EGD);  Surgeon: Lynann Bologna, MD;  Location: [facility] ENDOSCOPY;  Service: Endoscopy;  Laterality: N/A;   ESOPHAGOGASTRODUODENOSCOPY (EGD) WITH PROPOFOL N/A 02/05/2018   Procedure: ESOPHAGOGASTRODUODENOSCOPY (EGD) WITH PROPOFOL;  Surgeon: Napoleon Form, MD;  Location: [facility] ENDOSCOPY;  Service:  Endoscopy;  Laterality: N/A;   ESOPHAGOGASTRODUODENOSCOPY (EGD) WITH PROPOFOL N/A 09/27/2018   Procedure: ESOPHAGOGASTRODUODENOSCOPY (EGD) WITH PROPOFOL;  Surgeon: Jeani Hawking, MD;  Location: [facility] ENDOSCOPY;  Service: Endoscopy;  Laterality: N/A;   FOREIGN BODY REMOVAL  09/27/2018   Procedure: FOREIGN BODY REMOVAL;  Surgeon: Jeani Hawking, MD;  Location: [facility] ENDOSCOPY;  Service: Endoscopy;;   FOREIGN BODY REMOVAL  02/12/2019   Procedure: FOREIGN BODY REMOVAL;  Surgeon: Lynann Bologna, MD;  Location: [facility] ENDOSCOPY;  Service: Endoscopy;;   HERNIA REPAIR  05/14/2013   INSERTION OF MESH N/A 05/14/2013   Procedure: INSERTION OF MESH;  Surgeon: Ardeth Sportsman, MD;  Location: [facility] OR;  Service: General;  Laterality: N/A;   LEFT HEART CATH AND CORONARY ANGIOGRAPHY N/A 07/17/2021   Procedure: LEFT HEART CATH AND CORONARY ANGIOGRAPHY;  Surgeon: Lennette Bihari, MD;  Location: [facility] INVASIVE CV LAB;  Service: Cardiovascular;  Laterality: N/A;   LEFT HEART CATH AND CORONARY ANGIOGRAPHY N/A 09/18/2022   Procedure: LEFT HEART CATH AND CORONARY ANGIOGRAPHY;  Surgeon: Corky Crafts, MD;  Location: [facility] INVASIVE CV LAB;  Service: Cardiovascular;  Laterality: N/A;   LOWER EXTREMITY ANGIOGRAM Bilateral 10/28/2011   Procedure: LOWER EXTREMITY ANGIOGRAM;  Surgeon: [name], MD;  Location: [facility] CATH LAB;  Service: Cardiovascular;  Laterality: Bilateral;   LOWER EXTREMITY ANGIOGRAPHY  05/27/2022   Procedure: Lower Extremity Angiography;  Surgeon: Maeola Harman, MD;  Location: [facility] INVASIVE CV LAB;  Service: Cardiovascular;;   PACEMAKER INSERTION  07/16/2012   pacemaker/defibrilator   POLYPECTOMY     POSTERIOR CERVICAL FUSION/FORAMINOTOMY N/A 06/09/2014   Procedure: Laminectomy - Cervical two-Cervcial four posterior cervical instrumented fusion Cervical two-cervical four;  Surgeon: Tia Alert, MD;  Location: [facility] NEURO ORS;  Service: Neurosurgery;  Laterality: N/A;  posterior    SAVORY DILATION  N/A 02/05/2018   Procedure: SAVORY DILATION;  Surgeon: Napoleon Form, MD;  Location: [facility] ENDOSCOPY;  Service: Endoscopy;  Laterality: N/A;   TONSILLECTOMY     as a child    UMBILICAL HERNIA REPAIR N/A 05/14/2013   Procedure: LAPAROSCOPIC exploration and repair of hernia in abdominal ;  Surgeon: Ardeth Sportsman, MD;  Location: [facility] OR;  Service: General;  Laterality: N/A;   UPPER GASTROINTESTINAL ENDOSCOPY  2020    Allergies  Allergies  Allergen Reactions   Aldactone [Spironolactone] Other (See Comments)    Hyperkalemia   Codeine Rash and Hives   Atorvastatin     Myalgias with lipitor.  Does tolerate simvastatin.     Januvia [Sitagliptin] Other (See Comments)    Diarrhea and heart racing   Jardiance [Empagliflozin] Other (See Comments)    Polyuria; excessive weight loss   Lisinopril     Possible cause of pancreatitis    History of Present Illness    CANEK KLEISER is a 77 y.o. male with a hx of CHB, CAD (09/18/22 DES-LAD, known CTO of RCA with L-R collateral), HLD, ICM, IPF, ICM (prior LVEF 45-50%,  LVEF by [facility] 09/2022 55-65%), CRT-ICD, ZOX0R, PAD last seen for [facility] 09/18/22.  He saw Dr. Anne Fu 09/11/22 for anginal symptoms in anticipation of RLE revascularization. Prior LHC 2013 and 2023 resulted in medical mgmt of distal RCA CTO with collaterals. However, with new anginal symptoms on 09/18/22 underwent orbital atherectomy of LAD. Plan for RCA CTO intervention upcoming.   Presents today for follow up with his wife. Pleasant gentleman who is a [place] vet. Reviewed LHC and plan for CTO intervention 10/10/22. Exercises regularly on treadmill and stationary bike. Right radial site healing well with only dime-sized bruise. Reports exercise tolerance, breathing, energy level are much improved since procedure. Home BP at goal <130/80.   EKGs/Labs/Other Studies Reviewed:   The following studies were reviewed today: Cardiac Studies & Procedures   CARDIAC CATHETERIZATION  CARDIAC  CATHETERIZATION 09/18/2022  Narrative   Mid RCA to Dist RCA lesion is 50% stenosed.   Prox RCA lesion is 75% stenosed.   Mid RCA lesion is 25% stenosed.   Dist RCA lesion is 99% stenosed.   Prox LAD to Mid LAD lesion is 80% stenosed.   After IVL with 3.5 x 12mm Shockwave,  drug-eluting stent was successfully placed using a SYNERGY XD 3.50X12, postdilated to > 3.75 mm and optimized with IVUS.   Post intervention, there is a 0% residual stenosis.   The left ventricular systolic function is normal.   LV end diastolic pressure is normal.   The left ventricular ejection fraction is 55-65% by visual estimate.   There is no aortic valve stenosis.  Successful IVL followed by stent placement of LAD. CTO of distal RCA, now with a channel which may facilitate CTO PCI. Plan same day discharge.  Findings Coronary Findings Diagnostic  Dominance: Right  Left Anterior Descending Prox LAD to Mid LAD lesion is 80% stenosed. The lesion is severely calcified. Ultrasound (IVUS) was performed. Cross-sectional area: 3.07 mm.  Severe plaque burden was detected. IVUS has determined that the lesion is calcified.  First Diagonal Branch Vessel is small in size.  Right Coronary Artery Prox RCA lesion is 75% stenosed. Mid RCA lesion is 25% stenosed. Mid RCA to Dist RCA lesion is 50% stenosed. Dist RCA lesion is 99% stenosed. The lesion is chronically occluded with left-to-right collateral flow.  Third Right Posterolateral Branch Collaterals 3rd RPL filled by collaterals from Dist Cx.  Intervention  Prox LAD to Mid LAD lesion Stent CATH LAUNCHER 6FR EBU 3.75 guide catheter was inserted. Lesion crossed with guidewire using a WIRE RUNTHROUGH .[id number]. Pre-stent angioplasty was performed. A drug-eluting stent was successfully placed using a SYNERGY XD 3.50X12. Stent strut is well apposed. Post-stent angioplasty was performed using a BALL SAPPHIRE NC24 3.75X8. Predilatation with 3.5 x 12 shockwave, 70  pulses Post-Intervention Lesion Assessment The intervention was successful. Pre-interventional TIMI flow is 3. Post-intervention TIMI flow is 3. No complications occurred at this lesion. Ultrasound (IVUS) was performed on the lesion post PCI using a CATH OPTICROSS HD. Stent well apposed. There is a 0% residual stenosis post intervention.   CARDIAC CATHETERIZATION  CARDIAC CATHETERIZATION 07/17/2021  Narrative   Mid RCA to Dist RCA lesion is 75% stenosed.   Dist RCA lesion is 100% stenosed.   Prox LAD to Mid LAD lesion is 80% stenosed.   Mid RCA lesion is 80% stenosed.   LV end diastolic pressure is normal.  Two-vessel CAD with 80% focal proximal LAD stenosis which is unchanged from 2013 except with development of significant calcification.  Diffusely calcified RCA subtotal/total  occlusion distally but with excellent left-to-right collateralization to the PDA and PLA system.  Normal tortuous left circumflex coronary artery.  Relatively preserved LV function with LV EDP 11 mmHg.  RECOMMENDATION: The angiographic findings were reviewed with the catheterization from 2013.  The LAD stenosis is not significantly changed except is more calcified.  The RCA occlusion is new.  I suspect the recent exertional symptomatology is due to RCA occlusion with chest pain developing after walking approximately 20 minutes on a treadmill.  I discussed the findings with Dr. Anne Fu.  We will plan will be an increase medical regimen with further titration of isosorbide and possible initiation of Ranexa.   If he develops increasing symptomatology, consideration for surgical revascularization and/or orbital atherectomy to the calcified LAD stenosis can be considered.  Findings Coronary Findings Diagnostic  Dominance: Right  Left Anterior Descending Prox LAD to Mid LAD lesion is 80% stenosed.  First Diagonal Branch Vessel is small in size.  Right Coronary Artery Mid RCA lesion is 80% stenosed. Mid RCA to  Dist RCA lesion is 75% stenosed. Dist RCA lesion is 100% stenosed.  Third Right Posterolateral Branch Collaterals 3rd RPL filled by collaterals from Dist Cx.  Intervention  No interventions have been documented.     ECHOCARDIOGRAM  ECHOCARDIOGRAM COMPLETE 07/17/2021  Narrative ECHOCARDIOGRAM REPORT    Patient Name:   SEBERT ADDLEMAN Date of Exam: 07/17/2021 Medical Rec #:  [id number]        Height:       74.0 in Accession #:    [id number]       Weight:       185.0 lb Date of Birth:  07-20-1945         BSA:          2.103 m Patient Age:    75 years         BP:           141/74 mmHg Patient Gender: M                HR:           65 bpm. Exam Location:  Inpatient  Procedure: 2D Echo  Indications:    CAD  History:        Patient has prior history of Echocardiogram examinations, most recent 07/16/2017. CAD and Previous Myocardial Infarction, Aortic Dissection, Arrythmias:Atrial Fibrillation; Risk Factors:Diabetes, Dyslipidemia and Hypertension.  Sonographer:    Devonne Doughty Referring Phys: [phone] THOMAS A KELLY  IMPRESSIONS   1. Left ventricular ejection fraction, by estimation, is 40 to 45%. The left ventricle has mildly decreased function. The left ventricle demonstrates regional wall motion abnormalities (see scoring diagram/findings for description). Left ventricular diastolic parameters are consistent with Grade I diastolic dysfunction (impaired relaxation). 2. Right ventricular systolic function is normal. The right ventricular size is mildly enlarged. There is normal pulmonary artery systolic pressure. 3. The mitral valve is normal in structure. Trivial mitral valve regurgitation. 4. The aortic valve is tricuspid. There is mild calcification of the aortic valve. There is mild thickening of the aortic valve. Aortic valve regurgitation is not visualized. Aortic valve sclerosis is present, with no evidence of aortic valve stenosis.  Comparison(s): Prior images unable to be  directly viewed, comparison made by report only. The left ventricular function is worsened. The left ventricular wall motion abnormalities are new.  FINDINGS Left Ventricle: Left ventricular ejection fraction, by estimation, is 40 to 45%. The left ventricle has mildly decreased function. The left ventricle demonstrates  regional wall motion abnormalities. The left ventricular internal cavity size was normal in size. There is no left ventricular hypertrophy. Left ventricular diastolic parameters are consistent with Grade I diastolic dysfunction (impaired relaxation). Normal left ventricular filling pressure.   LV Wall Scoring: The inferior wall and posterior wall are akinetic.  Right Ventricle: The right ventricular size is mildly enlarged. No increase in right ventricular wall thickness. Right ventricular systolic function is normal. There is normal pulmonary artery systolic pressure. The tricuspid regurgitant velocity is 2.64 m/s, and with an assumed right atrial pressure of 3 mmHg, the estimated right ventricular systolic pressure is 30.9 mmHg.  Left Atrium: Left atrial size was normal in size.  Right Atrium: Right atrial size was normal in size.  Pericardium: There is no evidence of pericardial effusion. Presence of epicardial fat layer.  Mitral Valve: The mitral valve is normal in structure. Trivial mitral valve regurgitation.  Tricuspid Valve: The tricuspid valve is normal in structure. Tricuspid valve regurgitation is trivial.  Aortic Valve: The aortic valve is tricuspid. There is mild calcification of the aortic valve. There is mild thickening of the aortic valve. Aortic valve regurgitation is not visualized. Aortic valve sclerosis is present, with no evidence of aortic valve stenosis. Aortic valve mean gradient measures 4.5 mmHg. Aortic valve peak gradient measures 8.2 mmHg. Aortic valve area, by VTI measures 1.77 cm.  Pulmonic Valve: The pulmonic valve was normal in structure.  Pulmonic valve regurgitation is not visualized.  Aorta: The aortic root is normal in size and structure.  IAS/Shunts: The interatrial septum was not well visualized.  Additional Comments: A device lead is visualized in the right ventricle.   LEFT VENTRICLE PLAX 2D LVIDd:         4.30 cm   Diastology LVIDs:         3.10 cm   LV e' medial:    6.53 cm/s LV PW:         0.90 cm   LV E/e' medial:  9.8 LV IVS:        0.90 cm   LV e' lateral:   7.51 cm/s LVOT diam:     2.00 cm   LV E/e' lateral: 8.6 LV SV:         55 LV SV Index:   26 LVOT Area:     3.14 cm   RIGHT VENTRICLE RV Basal diam:  4.30 cm RV Mid diam:    4.10 cm RV S prime:     9.36 cm/s TAPSE (M-mode): 1.7 cm  LEFT ATRIUM             Index        RIGHT ATRIUM           Index LA diam:        3.20 cm 1.52 cm/m   RA Area:     22.00 cm LA Vol (A2C):   28.0 ml 13.32 ml/m  RA Volume:   70.60 ml  33.58 ml/m LA Vol (A4C):   46.7 ml 22.21 ml/m LA Biplane Vol: 40.0 ml 19.02 ml/m AORTIC VALVE                     PULMONIC VALVE AV Area (Vmax):    1.84 cm      PV Vmax:       0.79 m/s AV Area (Vmean):   1.77 cm      PV Peak grad:  2.5 mmHg AV Area (VTI):     1.77  cm AV Vmax:           143.00 cm/s AV Vmean:          100.100 cm/s AV VTI:            0.308 m AV Peak Grad:      8.2 mmHg AV Mean Grad:      4.5 mmHg LVOT Vmax:         83.90 cm/s LVOT Vmean:        56.350 cm/s LVOT VTI:          0.174 m LVOT/AV VTI ratio: 0.56  AORTA Ao Root diam: 2.80 cm Ao Asc diam:  3.30 cm  MITRAL VALVE                TRICUSPID VALVE MV Area (PHT): 2.84 cm     TR Peak grad:   27.9 mmHg MV Decel Time: 267 msec     TR Vmax:        264.00 cm/s MV E velocity: 64.30 cm/s MV A velocity: 100.00 cm/s  SHUNTS MV E/A ratio:  0.64         Systemic VTI:  0.17 m Systemic Diam: 2.00 cm  Rachelle Hora Croitoru MD Electronically signed by Thurmon Fair MD Signature Date/Time: 07/17/2021/2:13:04 PM    Final              EKG:  EKG is  ordered  today.  The ekg ordered today demonstrates AV dual paced rhythm 60 bpm with LBBB.  Recent Labs: 04/04/2022: Pro B Natriuretic peptide (BNP) 63.0 08/30/2022: ALT 10 10/02/2022: BUN 25; Creatinine, Ser 1.19; Hemoglobin 14.5; Platelets 283; Potassium 5.2; Sodium 142  Recent Lipid Panel    Component Value Date/Time   CHOL 153 04/16/2022 1051   TRIG 346.0 (H) 04/16/2022 1051   HDL 34.40 (L) 04/16/2022 1051   CHOLHDL 4 04/16/2022 1051   VLDL 69.2 (H) 04/16/2022 1051   LDLCALC 74 04/13/2021 1628   LDLDIRECT 75.0 04/16/2022 1051   Home Medications   Current Meds  Medication Sig   albuterol (VENTOLIN HFA) 108 (90 Base) MCG/ACT inhaler Inhale 2 puffs into the lungs every 6 (six) hours as needed for wheezing or shortness of breath.   amLODipine (NORVASC) 10 MG tablet TAKE 1 TABLET(10 MG) BY MOUTH DAILY (Patient taking differently: Take 10 mg by mouth daily with supper.)   aspirin EC 81 MG tablet Take 1 tablet (81 mg total) by mouth daily.   carvedilol (COREG) 25 MG tablet TAKE 1 TABLET BY MOUTH TWICE DAILY WITH MEALS   Cholecalciferol (VITAMIN D3) 50 MCG (2000 UT) TABS Take 2,000 Units by mouth in the morning.   clopidogrel (PLAVIX) 75 MG tablet Take 1 tablet (75 mg total) by mouth daily with breakfast.   Cyanocobalamin (VITAMIN B-12 PO) Take 2,000 mcg by mouth in the morning.   ESBRIET 267 MG TABS Take 2 tablets (534 mg total) by mouth in the morning, at noon, and at bedtime. **NOTE LOW DOSE AS MAINTENANCE**   famotidine (PEPCID) 20 MG tablet Take 1 tablet (20 mg total) by mouth at bedtime.   glipiZIDE (GLUCOTROL) 5 MG tablet TAKE 1 TABLET(5 MG) BY MOUTH TWICE DAILY BEFORE A MEAL   glucose blood (ACCU-CHEK AVIVA PLUS) test strip AS DIRECTED TO CHECK BLOOD SUGAR TWICE DAILY   insulin glargine (LANTUS SOLOSTAR) 100 UNIT/ML Solostar Pen INJECT 0.3 TO 0.35 MLS(30 TO 35 UNITS) INTO THE SKIN EVERY DAY (Patient taking differently: Inject 30 Units into the skin at bedtime.)  isosorbide mononitrate  (IMDUR) 30 MG 24 hr tablet Take 1 tablet (30 mg total) by mouth daily. (Patient taking differently: Take 30 mg by mouth daily with supper.)   metFORMIN (GLUCOPHAGE) 500 MG tablet TAKE 2 TABLETS BY MOUTH TWICE DAILY WITH FOOD   Multiple Vitamin (MULTIVITAMIN WITH MINERALS) TABS tablet Take 1 tablet by mouth in the morning.   pantoprazole (PROTONIX) 40 MG tablet Take 2 tablets (80 mg total) by mouth daily.   rosuvastatin (CRESTOR) 20 MG tablet Take 1 tablet (20 mg total) by mouth daily.   umeclidinium bromide (INCRUSE ELLIPTA) 62.5 MCG/ACT AEPB Inhale 1 puff into the lungs daily.   valsartan (DIOVAN) 40 MG tablet Take 20 mg by mouth daily with supper.     Review of Systems      All other systems reviewed and are otherwise negative except as noted above.  Physical Exam    VS:  BP 128/72   Pulse 60   Ht 6\' 2"  (1.88 m)   Wt 183 lb (83 kg)   BMI 23.50 kg/m  , BMI Body mass index is 23.5 kg/m.  Wt Readings from Last 3 Encounters:  10/02/22 183 lb (83 kg)  09/18/22 182 lb (82.6 kg)  09/11/22 186 lb 6.4 oz (84.6 kg)    GEN: Well nourished, well developed, in no acute distress. HEENT: normal. Neck: Supple, no JVD, carotid bruits, or masses. Cardiac: RRR, no murmurs, rubs, or gallops. No clubbing, cyanosis, edema.  Radials/PT 2+ and equal bilaterally.  Respiratory:  Respirations regular and unlabored, clear to auscultation bilaterally. GI: Soft, nontender, nondistended. MS: No deformity or atrophy. Skin: Warm and dry, no rash. Right radial site with dime-sized purple ecchymosis but no hematoma nor evidence of infection.  Neuro:  Strength and sensation are intact. Psych: Normal affect.  Assessment & Plan    CAD - s/p DES-LAD with R radial site healing appropriately. Dyspnea, exercise tolerance much improved. Upcoming CTO-RCA intervention. GDMT aspirin, amlodipine, carvedilol, imdur, rosuvastatin. Hopeful to be able to discontinue Imdur post procedure as reports some side effects.  Exercising regularly on treadmill and bike. Heart healthy diet and regular cardiovascular exercise encouraged.    HTN - BP initially elevated but improved to 128/72 without intervention. Some element of white coat hypertension. BP well controlled. Continue current antihypertensive regimen.  Discussed to monitor BP at home at least 2 hours after medications and sitting for 5-10 minutes.   ICM/HFrEF - Did not tolerate Spironolactone, Entresto, nor Jardiance due to side effects. Prior LVEF 40-45% but has normalized to 55-65% by recent LHC. Euvolemic and well compensated on exam.   CHB s/p ICD/CRT/LBBB - Follows with Dr. Graciela Husbands. Pacer spikes appropriately visible on EKG. No ICD firings.   PAD - upcoming R femoral endarterectomy with Dr. Randie Heinz  IPF - Follows with Dr. Marchelle Gearing      Disposition: Follow up  7-10 days after CTO-RCA intervention  with Donato Schultz, MD or APP.  Signed, Alver Sorrow, NP 10/03/2022, 9:09 PM [facility]

## 2022-10-02 NOTE — Progress Notes (Unsigned)
Office Visit    Patient Name: Luke [name] Date of Encounter: 10/03/2022  PCP:  [name], MD   [facility] Cardiologist:  Donato Schultz, MD  Advanced Practice Provider:  No care team member to display Electrophysiologist:  None      Chief Complaint    Luke [name] is a 77 y.o. male presents today for follow up after [facility]   Past Medical History    Past Medical History:  Diagnosis Date   AAA (abdominal aortic aneurysm) (HCC) 2011   Per vascular surgery   Anginal pain (HCC) 2024   With exertion   Atrial fibrillation (HCC)    Automatic implantable cardioverter-defibrillator in situ    CAD (coronary artery disease)    Presumed CAD with nuclear scan October 09, 2011,  large anteroseptal MI and inferior MI. Catheterization scheduled October 15, 2011   Cardiomyopathy [facility])    Nuclear, October 09, 2011, EF 30%, multiple focal wall motion abnormalities   Chronic kidney disease    CKD3   Diabetes mellitus    type II   Drug therapy    Hyperkalemia with spironolactone   Dyspnea    Ejection fraction < 50%    EF 30%, nuclear, October 09, 2011   Ejection fraction < 50%    EF previously 30%  //   EF 30-35%, echo, July 14, 2012, severe diffuse hypokinesis, PA pressure 43 mm mercury   Fall due to ice or snow 06/24/2013   GERD (gastroesophageal reflux disease)    Silent   HLD (hyperlipidemia)    Hypertension    white coat HTN-- often elevated in office and controlled on outside checks.   ICD (implantable cardioverter-defibrillator) in place    CRT-D placed March, 2014 complete heart block and k dysfunction   IPF (idiopathic pulmonary fibrosis) (HCC)    LBBB (left bundle branch block)    LBBB on EKG October 11, 2011,  no prior EKG has been done   Low testosterone    Hx of   Myocardial infarction (HCC)    Told in 2015-never knew-showed up on stress test for AAA;    Pacemaker    PAD (peripheral artery disease) (HCC)    Pancreatitis    Past Surgical History:   Procedure Laterality Date   ABDOMINAL AORTAGRAM N/A 10/28/2011   Procedure: ABDOMINAL Ronny Flurry;  Surgeon: [name], MD;  Location: [facility] CATH LAB;  Service: Cardiovascular;  Laterality: N/A;   ABDOMINAL AORTIC ANEURYSM REPAIR     EVAR    ABDOMINAL AORTOGRAM N/A 05/27/2022   Procedure: ABDOMINAL AORTOGRAM;  Surgeon: Maeola Harman, MD;  Location: [facility] INVASIVE CV LAB;  Service: Cardiovascular;  Laterality: N/A;   BI-VENTRICULAR IMPLANTABLE CARDIOVERTER DEFIBRILLATOR N/A 07/16/2012   Procedure: BI-VENTRICULAR IMPLANTABLE CARDIOVERTER DEFIBRILLATOR  (CRT-D);  Surgeon: Duke Salvia, MD;  Location: [facility] CATH LAB;  Service: Cardiovascular;  Laterality: N/A;   BIV ICD GENERATOR CHANGEOUT N/A 10/25/2020   Procedure: BIV ICD GENERATOR CHANGEOUT;  Surgeon: Duke Salvia, MD;  Location: [facility] INVASIVE CV LAB;  Service: Cardiovascular;  Laterality: N/A;   CARDIAC CATHETERIZATION     CATARACT EXTRACTION Left 2023   COLONOSCOPY  03/23/2018; 2020   CORONARY LITHOTRIPSY N/A 09/18/2022   Procedure: CORONARY LITHOTRIPSY;  Surgeon: Corky Crafts, MD;  Location: [facility] INVASIVE CV LAB;  Service: Cardiovascular;  Laterality: N/A;   CORONARY STENT INTERVENTION N/A 09/18/2022   Procedure: CORONARY STENT INTERVENTION;  Surgeon: Corky Crafts, MD;  Location: [facility]  INVASIVE CV LAB;  Service: Cardiovascular;  Laterality: N/A;   CORONARY ULTRASOUND/IVUS N/A 09/18/2022   Procedure: Coronary Ultrasound/IVUS;  Surgeon: Corky Crafts, MD;  Location: [facility] INVASIVE CV LAB;  Service: Cardiovascular;  Laterality: N/A;   ESOPHAGOGASTRODUODENOSCOPY N/A 02/12/2019   Procedure: ESOPHAGOGASTRODUODENOSCOPY (EGD);  Surgeon: Lynann Bologna, MD;  Location: [facility] ENDOSCOPY;  Service: Endoscopy;  Laterality: N/A;   ESOPHAGOGASTRODUODENOSCOPY (EGD) WITH PROPOFOL N/A 02/05/2018   Procedure: ESOPHAGOGASTRODUODENOSCOPY (EGD) WITH PROPOFOL;  Surgeon: Napoleon Form, MD;  Location: [facility] ENDOSCOPY;  Service:  Endoscopy;  Laterality: N/A;   ESOPHAGOGASTRODUODENOSCOPY (EGD) WITH PROPOFOL N/A 09/27/2018   Procedure: ESOPHAGOGASTRODUODENOSCOPY (EGD) WITH PROPOFOL;  Surgeon: Jeani Hawking, MD;  Location: [facility] ENDOSCOPY;  Service: Endoscopy;  Laterality: N/A;   FOREIGN BODY REMOVAL  09/27/2018   Procedure: FOREIGN BODY REMOVAL;  Surgeon: Jeani Hawking, MD;  Location: [facility] ENDOSCOPY;  Service: Endoscopy;;   FOREIGN BODY REMOVAL  02/12/2019   Procedure: FOREIGN BODY REMOVAL;  Surgeon: Lynann Bologna, MD;  Location: [facility] ENDOSCOPY;  Service: Endoscopy;;   HERNIA REPAIR  05/14/2013   INSERTION OF MESH N/A 05/14/2013   Procedure: INSERTION OF MESH;  Surgeon: Ardeth Sportsman, MD;  Location: [facility] OR;  Service: General;  Laterality: N/A;   LEFT HEART CATH AND CORONARY ANGIOGRAPHY N/A 07/17/2021   Procedure: LEFT HEART CATH AND CORONARY ANGIOGRAPHY;  Surgeon: Lennette Bihari, MD;  Location: [facility] INVASIVE CV LAB;  Service: Cardiovascular;  Laterality: N/A;   LEFT HEART CATH AND CORONARY ANGIOGRAPHY N/A 09/18/2022   Procedure: LEFT HEART CATH AND CORONARY ANGIOGRAPHY;  Surgeon: Corky Crafts, MD;  Location: [facility] INVASIVE CV LAB;  Service: Cardiovascular;  Laterality: N/A;   LOWER EXTREMITY ANGIOGRAM Bilateral 10/28/2011   Procedure: LOWER EXTREMITY ANGIOGRAM;  Surgeon: [name], MD;  Location: [facility] CATH LAB;  Service: Cardiovascular;  Laterality: Bilateral;   LOWER EXTREMITY ANGIOGRAPHY  05/27/2022   Procedure: Lower Extremity Angiography;  Surgeon: Maeola Harman, MD;  Location: [facility] INVASIVE CV LAB;  Service: Cardiovascular;;   PACEMAKER INSERTION  07/16/2012   pacemaker/defibrilator   POLYPECTOMY     POSTERIOR CERVICAL FUSION/FORAMINOTOMY N/A 06/09/2014   Procedure: Laminectomy - Cervical two-Cervcial four posterior cervical instrumented fusion Cervical two-cervical four;  Surgeon: Tia Alert, MD;  Location: [facility] NEURO ORS;  Service: Neurosurgery;  Laterality: N/A;  posterior    SAVORY DILATION  N/A 02/05/2018   Procedure: SAVORY DILATION;  Surgeon: Napoleon Form, MD;  Location: [facility] ENDOSCOPY;  Service: Endoscopy;  Laterality: N/A;   TONSILLECTOMY     as a child    UMBILICAL HERNIA REPAIR N/A 05/14/2013   Procedure: LAPAROSCOPIC exploration and repair of hernia in abdominal ;  Surgeon: Ardeth Sportsman, MD;  Location: [facility] OR;  Service: General;  Laterality: N/A;   UPPER GASTROINTESTINAL ENDOSCOPY  2020    Allergies  Allergies  Allergen Reactions   Aldactone [Spironolactone] Other (See Comments)    Hyperkalemia   Codeine Rash and Hives   Atorvastatin     Myalgias with lipitor.  Does tolerate simvastatin.     Januvia [Sitagliptin] Other (See Comments)    Diarrhea and heart racing   Jardiance [Empagliflozin] Other (See Comments)    Polyuria; excessive weight loss   Lisinopril     Possible cause of pancreatitis    History of Present Illness    Luke [name] is a 77 y.o. male with a hx of CHB, CAD (09/18/22 DES-LAD, known CTO of RCA with L-R collateral), HLD, ICM, IPF, ICM (prior LVEF 45-50%,  LVEF by [facility] 09/2022 55-65%), CRT-ICD, ZOX0R, PAD last seen for [facility] 09/18/22.  He saw Dr. Anne Fu 09/11/22 for anginal symptoms in anticipation of RLE revascularization. Prior LHC 2013 and 2023 resulted in medical mgmt of distal RCA CTO with collaterals. However, with new anginal symptoms on 09/18/22 underwent orbital atherectomy of LAD. Plan for RCA CTO intervention upcoming.   Presents today for follow up with his wife. Pleasant gentleman who is a [place] vet. Reviewed LHC and plan for CTO intervention 10/10/22. Exercises regularly on treadmill and stationary bike. Right radial site healing well with only dime-sized bruise. Reports exercise tolerance, breathing, energy level are much improved since procedure. Home BP at goal <130/80.   EKGs/Labs/Other Studies Reviewed:   The following studies were reviewed today: Cardiac Studies & Procedures   CARDIAC CATHETERIZATION  CARDIAC  CATHETERIZATION 09/18/2022  Narrative   Mid RCA to Dist RCA lesion is 50% stenosed.   Prox RCA lesion is 75% stenosed.   Mid RCA lesion is 25% stenosed.   Dist RCA lesion is 99% stenosed.   Prox LAD to Mid LAD lesion is 80% stenosed.   After IVL with 3.5 x 12mm Shockwave,  drug-eluting stent was successfully placed using a SYNERGY XD 3.50X12, postdilated to > 3.75 mm and optimized with IVUS.   Post intervention, there is a 0% residual stenosis.   The left ventricular systolic function is normal.   LV end diastolic pressure is normal.   The left ventricular ejection fraction is 55-65% by visual estimate.   There is no aortic valve stenosis.  Successful IVL followed by stent placement of LAD. CTO of distal RCA, now with a channel which may facilitate CTO PCI. Plan same day discharge.  Findings Coronary Findings Diagnostic  Dominance: Right  Left Anterior Descending Prox LAD to Mid LAD lesion is 80% stenosed. The lesion is severely calcified. Ultrasound (IVUS) was performed. Cross-sectional area: 3.07 mm.  Severe plaque burden was detected. IVUS has determined that the lesion is calcified.  First Diagonal Branch Vessel is small in size.  Right Coronary Artery Prox RCA lesion is 75% stenosed. Mid RCA lesion is 25% stenosed. Mid RCA to Dist RCA lesion is 50% stenosed. Dist RCA lesion is 99% stenosed. The lesion is chronically occluded with left-to-right collateral flow.  Third Right Posterolateral Branch Collaterals 3rd RPL filled by collaterals from Dist Cx.  Intervention  Prox LAD to Mid LAD lesion Stent CATH LAUNCHER 6FR EBU 3.75 guide catheter was inserted. Lesion crossed with guidewire using a WIRE RUNTHROUGH .[id number]. Pre-stent angioplasty was performed. A drug-eluting stent was successfully placed using a SYNERGY XD 3.50X12. Stent strut is well apposed. Post-stent angioplasty was performed using a BALL SAPPHIRE NC24 3.75X8. Predilatation with 3.5 x 12 shockwave, 70  pulses Post-Intervention Lesion Assessment The intervention was successful. Pre-interventional TIMI flow is 3. Post-intervention TIMI flow is 3. No complications occurred at this lesion. Ultrasound (IVUS) was performed on the lesion post PCI using a CATH OPTICROSS HD. Stent well apposed. There is a 0% residual stenosis post intervention.   CARDIAC CATHETERIZATION  CARDIAC CATHETERIZATION 07/17/2021  Narrative   Mid RCA to Dist RCA lesion is 75% stenosed.   Dist RCA lesion is 100% stenosed.   Prox LAD to Mid LAD lesion is 80% stenosed.   Mid RCA lesion is 80% stenosed.   LV end diastolic pressure is normal.  Two-vessel CAD with 80% focal proximal LAD stenosis which is unchanged from 2013 except with development of significant calcification.  Diffusely calcified RCA subtotal/total  occlusion distally but with excellent left-to-right collateralization to the PDA and PLA system.  Normal tortuous left circumflex coronary artery.  Relatively preserved LV function with LV EDP 11 mmHg.  RECOMMENDATION: The angiographic findings were reviewed with the catheterization from 2013.  The LAD stenosis is not significantly changed except is more calcified.  The RCA occlusion is new.  I suspect the recent exertional symptomatology is due to RCA occlusion with chest pain developing after walking approximately 20 minutes on a treadmill.  I discussed the findings with Dr. Anne Fu.  We will plan will be an increase medical regimen with further titration of isosorbide and possible initiation of Ranexa.   If he develops increasing symptomatology, consideration for surgical revascularization and/or orbital atherectomy to the calcified LAD stenosis can be considered.  Findings Coronary Findings Diagnostic  Dominance: Right  Left Anterior Descending Prox LAD to Mid LAD lesion is 80% stenosed.  First Diagonal Branch Vessel is small in size.  Right Coronary Artery Mid RCA lesion is 80% stenosed. Mid RCA to  Dist RCA lesion is 75% stenosed. Dist RCA lesion is 100% stenosed.  Third Right Posterolateral Branch Collaterals 3rd RPL filled by collaterals from Dist Cx.  Intervention  No interventions have been documented.     ECHOCARDIOGRAM  ECHOCARDIOGRAM COMPLETE 07/17/2021  Narrative ECHOCARDIOGRAM REPORT    Patient Name:   Luke [name] Date of Exam: 07/17/2021 Medical Rec #:  [id number]        Height:       74.0 in Accession #:    [id number]       Weight:       185.0 lb Date of Birth:  11/03/1945         BSA:          2.103 m Patient Age:    75 years         BP:           141/74 mmHg Patient Gender: M                HR:           65 bpm. Exam Location:  Inpatient  Procedure: 2D Echo  Indications:    CAD  History:        Patient has prior history of Echocardiogram examinations, most recent 07/16/2017. CAD and Previous Myocardial Infarction, Aortic Dissection, Arrythmias:Atrial Fibrillation; Risk Factors:Diabetes, Dyslipidemia and Hypertension.  Sonographer:    Devonne Doughty Referring Phys: [id number] THOMAS A KELLY  IMPRESSIONS   1. Left ventricular ejection fraction, by estimation, is 40 to 45%. The left ventricle has mildly decreased function. The left ventricle demonstrates regional wall motion abnormalities (see scoring diagram/findings for description). Left ventricular diastolic parameters are consistent with Grade I diastolic dysfunction (impaired relaxation). 2. Right ventricular systolic function is normal. The right ventricular size is mildly enlarged. There is normal pulmonary artery systolic pressure. 3. The mitral valve is normal in structure. Trivial mitral valve regurgitation. 4. The aortic valve is tricuspid. There is mild calcification of the aortic valve. There is mild thickening of the aortic valve. Aortic valve regurgitation is not visualized. Aortic valve sclerosis is present, with no evidence of aortic valve stenosis.  Comparison(s): Prior images unable to be  directly viewed, comparison made by report only. The left ventricular function is worsened. The left ventricular wall motion abnormalities are new.  FINDINGS Left Ventricle: Left ventricular ejection fraction, by estimation, is 40 to 45%. The left ventricle has mildly decreased function. The left ventricle demonstrates  regional wall motion abnormalities. The left ventricular internal cavity size was normal in size. There is no left ventricular hypertrophy. Left ventricular diastolic parameters are consistent with Grade I diastolic dysfunction (impaired relaxation). Normal left ventricular filling pressure.   LV Wall Scoring: The inferior wall and posterior wall are akinetic.  Right Ventricle: The right ventricular size is mildly enlarged. No increase in right ventricular wall thickness. Right ventricular systolic function is normal. There is normal pulmonary artery systolic pressure. The tricuspid regurgitant velocity is 2.64 m/s, and with an assumed right atrial pressure of 3 mmHg, the estimated right ventricular systolic pressure is 30.9 mmHg.  Left Atrium: Left atrial size was normal in size.  Right Atrium: Right atrial size was normal in size.  Pericardium: There is no evidence of pericardial effusion. Presence of epicardial fat layer.  Mitral Valve: The mitral valve is normal in structure. Trivial mitral valve regurgitation.  Tricuspid Valve: The tricuspid valve is normal in structure. Tricuspid valve regurgitation is trivial.  Aortic Valve: The aortic valve is tricuspid. There is mild calcification of the aortic valve. There is mild thickening of the aortic valve. Aortic valve regurgitation is not visualized. Aortic valve sclerosis is present, with no evidence of aortic valve stenosis. Aortic valve mean gradient measures 4.5 mmHg. Aortic valve peak gradient measures 8.2 mmHg. Aortic valve area, by VTI measures 1.77 cm.  Pulmonic Valve: The pulmonic valve was normal in structure.  Pulmonic valve regurgitation is not visualized.  Aorta: The aortic root is normal in size and structure.  IAS/Shunts: The interatrial septum was not well visualized.  Additional Comments: A device lead is visualized in the right ventricle.   LEFT VENTRICLE PLAX 2D LVIDd:         4.30 cm   Diastology LVIDs:         3.10 cm   LV e' medial:    6.53 cm/s LV PW:         0.90 cm   LV E/e' medial:  9.8 LV IVS:        0.90 cm   LV e' lateral:   7.51 cm/s LVOT diam:     2.00 cm   LV E/e' lateral: 8.6 LV SV:         55 LV SV Index:   26 LVOT Area:     3.14 cm   RIGHT VENTRICLE RV Basal diam:  4.30 cm RV Mid diam:    4.10 cm RV S prime:     9.36 cm/s TAPSE (M-mode): 1.7 cm  LEFT ATRIUM             Index        RIGHT ATRIUM           Index LA diam:        3.20 cm 1.52 cm/m   RA Area:     22.00 cm LA Vol (A2C):   28.0 ml 13.32 ml/m  RA Volume:   70.60 ml  33.58 ml/m LA Vol (A4C):   46.7 ml 22.21 ml/m LA Biplane Vol: 40.0 ml 19.02 ml/m AORTIC VALVE                     PULMONIC VALVE AV Area (Vmax):    1.84 cm      PV Vmax:       0.79 m/s AV Area (Vmean):   1.77 cm      PV Peak grad:  2.5 mmHg AV Area (VTI):     1.77  cm AV Vmax:           143.00 cm/s AV Vmean:          100.100 cm/s AV VTI:            0.308 m AV Peak Grad:      8.2 mmHg AV Mean Grad:      4.5 mmHg LVOT Vmax:         83.90 cm/s LVOT Vmean:        56.350 cm/s LVOT VTI:          0.174 m LVOT/AV VTI ratio: 0.56  AORTA Ao Root diam: 2.80 cm Ao Asc diam:  3.30 cm  MITRAL VALVE                TRICUSPID VALVE MV Area (PHT): 2.84 cm     TR Peak grad:   27.9 mmHg MV Decel Time: 267 msec     TR Vmax:        264.00 cm/s MV E velocity: 64.30 cm/s MV A velocity: 100.00 cm/s  SHUNTS MV E/A ratio:  0.64         Systemic VTI:  0.17 m Systemic Diam: 2.00 cm  Rachelle Hora Croitoru MD Electronically signed by Thurmon Fair MD Signature Date/Time: 07/17/2021/2:13:04 PM    Final              EKG:  EKG is  ordered  today.  The ekg ordered today demonstrates AV dual paced rhythm 60 bpm with LBBB.  Recent Labs: 04/04/2022: Pro B Natriuretic peptide (BNP) 63.0 08/30/2022: ALT 10 10/02/2022: BUN 25; Creatinine, Ser 1.19; Hemoglobin 14.5; Platelets 283; Potassium 5.2; Sodium 142  Recent Lipid Panel    Component Value Date/Time   CHOL 153 04/16/2022 1051   TRIG 346.0 (H) 04/16/2022 1051   HDL 34.40 (L) 04/16/2022 1051   CHOLHDL 4 04/16/2022 1051   VLDL 69.2 (H) 04/16/2022 1051   LDLCALC 74 04/13/2021 1628   LDLDIRECT 75.0 04/16/2022 1051   Home Medications   Current Meds  Medication Sig   albuterol (VENTOLIN HFA) 108 (90 Base) MCG/ACT inhaler Inhale 2 puffs into the lungs every 6 (six) hours as needed for wheezing or shortness of breath.   amLODipine (NORVASC) 10 MG tablet TAKE 1 TABLET(10 MG) BY MOUTH DAILY (Patient taking differently: Take 10 mg by mouth daily with supper.)   aspirin EC 81 MG tablet Take 1 tablet (81 mg total) by mouth daily.   carvedilol (COREG) 25 MG tablet TAKE 1 TABLET BY MOUTH TWICE DAILY WITH MEALS   Cholecalciferol (VITAMIN D3) 50 MCG (2000 UT) TABS Take 2,000 Units by mouth in the morning.   clopidogrel (PLAVIX) 75 MG tablet Take 1 tablet (75 mg total) by mouth daily with breakfast.   Cyanocobalamin (VITAMIN B-12 PO) Take 2,000 mcg by mouth in the morning.   ESBRIET 267 MG TABS Take 2 tablets (534 mg total) by mouth in the morning, at noon, and at bedtime. **NOTE LOW DOSE AS MAINTENANCE**   famotidine (PEPCID) 20 MG tablet Take 1 tablet (20 mg total) by mouth at bedtime.   glipiZIDE (GLUCOTROL) 5 MG tablet TAKE 1 TABLET(5 MG) BY MOUTH TWICE DAILY BEFORE A MEAL   glucose blood (ACCU-CHEK AVIVA PLUS) test strip AS DIRECTED TO CHECK BLOOD SUGAR TWICE DAILY   insulin glargine (LANTUS SOLOSTAR) 100 UNIT/ML Solostar Pen INJECT 0.3 TO 0.35 MLS(30 TO 35 UNITS) INTO THE SKIN EVERY DAY (Patient taking differently: Inject 30 Units into the skin at bedtime.)  isosorbide mononitrate  (IMDUR) 30 MG 24 hr tablet Take 1 tablet (30 mg total) by mouth daily. (Patient taking differently: Take 30 mg by mouth daily with supper.)   metFORMIN (GLUCOPHAGE) 500 MG tablet TAKE 2 TABLETS BY MOUTH TWICE DAILY WITH FOOD   Multiple Vitamin (MULTIVITAMIN WITH MINERALS) TABS tablet Take 1 tablet by mouth in the morning.   pantoprazole (PROTONIX) 40 MG tablet Take 2 tablets (80 mg total) by mouth daily.   rosuvastatin (CRESTOR) 20 MG tablet Take 1 tablet (20 mg total) by mouth daily.   umeclidinium bromide (INCRUSE ELLIPTA) 62.5 MCG/ACT AEPB Inhale 1 puff into the lungs daily.   valsartan (DIOVAN) 40 MG tablet Take 20 mg by mouth daily with supper.     Review of Systems      All other systems reviewed and are otherwise negative except as noted above.  Physical Exam    VS:  BP 128/72   Pulse 60   Ht 6\' 2"  (1.88 m)   Wt 183 lb (83 kg)   BMI 23.50 kg/m  , BMI Body mass index is 23.5 kg/m.  Wt Readings from Last 3 Encounters:  10/02/22 183 lb (83 kg)  09/18/22 182 lb (82.6 kg)  09/11/22 186 lb 6.4 oz (84.6 kg)    GEN: Well nourished, well developed, in no acute distress. HEENT: normal. Neck: Supple, no JVD, carotid bruits, or masses. Cardiac: RRR, no murmurs, rubs, or gallops. No clubbing, cyanosis, edema.  Radials/PT 2+ and equal bilaterally.  Respiratory:  Respirations regular and unlabored, clear to auscultation bilaterally. GI: Soft, nontender, nondistended. MS: No deformity or atrophy. Skin: Warm and dry, no rash. Right radial site with dime-sized purple ecchymosis but no hematoma nor evidence of infection.  Neuro:  Strength and sensation are intact. Psych: Normal affect.  Assessment & Plan    CAD - s/p DES-LAD with R radial site healing appropriately. Dyspnea, exercise tolerance much improved. Upcoming CTO-RCA intervention. GDMT aspirin, amlodipine, carvedilol, imdur, rosuvastatin. Hopeful to be able to discontinue Imdur post procedure as reports some side effects.  Exercising regularly on treadmill and bike. Heart healthy diet and regular cardiovascular exercise encouraged.    HTN - BP initially elevated but improved to 128/72 without intervention. Some element of white coat hypertension. BP well controlled. Continue current antihypertensive regimen.  Discussed to monitor BP at home at least 2 hours after medications and sitting for 5-10 minutes.   ICM/HFrEF - Did not tolerate Spironolactone, Entresto, nor Jardiance due to side effects. Prior LVEF 40-45% but has normalized to 55-65% by recent LHC. Euvolemic and well compensated on exam.   CHB s/p ICD/CRT/LBBB - Follows with Dr. Graciela Husbands. Pacer spikes appropriately visible on EKG. No ICD firings.   PAD - upcoming R femoral endarterectomy with Dr. Randie Heinz  IPF - Follows with Dr. Marchelle Gearing      Disposition: Follow up  7-10 days after CTO-RCA intervention  with Donato Schultz, MD or APP.  Signed, Alver Sorrow, NP 10/03/2022, 9:09 PM [facility]

## 2022-10-03 ENCOUNTER — Encounter (HOSPITAL_BASED_OUTPATIENT_CLINIC_OR_DEPARTMENT_OTHER): Payer: Self-pay | Admitting: Family

## 2022-10-03 ENCOUNTER — Encounter (HOSPITAL_BASED_OUTPATIENT_CLINIC_OR_DEPARTMENT_OTHER): Payer: Self-pay

## 2022-10-03 LAB — CBC
Hematocrit: 44.2 % (ref 37.5–51.0)
Hemoglobin: 14.5 g/dL (ref 13.0–17.7)
MCH: 31.9 pg (ref 26.6–33.0)
MCHC: 32.8 g/dL (ref 31.5–35.7)
MCV: 97 fL (ref 79–97)
Platelets: 283 10*3/uL (ref 150–450)
RBC: 4.55 x10E6/uL (ref 4.14–5.80)
RDW: 12.4 % (ref 11.6–15.4)
WBC: 12.5 10*3/uL — ABNORMAL HIGH (ref 3.4–10.8)

## 2022-10-03 LAB — BASIC METABOLIC PANEL
BUN/Creatinine Ratio: 21 (ref 10–24)
BUN: 25 mg/dL (ref 8–27)
CO2: 13 mmol/L — ABNORMAL LOW (ref 20–29)
Calcium: 10.5 mg/dL — ABNORMAL HIGH (ref 8.6–10.2)
Chloride: 101 mmol/L (ref 96–106)
Creatinine, Ser: 1.19 mg/dL (ref 0.76–1.27)
Glucose: 191 mg/dL — ABNORMAL HIGH (ref 70–99)
Potassium: 5.2 mmol/L (ref 3.5–5.2)
Sodium: 142 mmol/L (ref 134–144)
eGFR: 64 mL/min/{1.73_m2} (ref >59)

## 2022-10-03 NOTE — Progress Notes (Addendum)
Care Management & Coordination Services Pharmacy Team  Reason for Encounter: Patient assistance renewal   Patient is currently enrolled in [employer] patient assistance program for the medication  Incruse Ellipta 62.5 mcg/act -approved 10/03/22 thru 05/06/23   The application was downloaded on behalf of the patient. Forms were forwarded to clinical pharmacist for review and to determine next steps. Manufacturer contacted and some additional information on prescriber relayed. Per manufacturer, patient is approved,shipment in 7-10 days to patients address - patient notified  Al Corpus, PharmD notified  Burt Knack, [facility] Clinical Pharmacy Assistant [phone]

## 2022-10-03 NOTE — Telephone Encounter (Signed)
Follow up question from yesterday appointment

## 2022-10-05 ENCOUNTER — Other Ambulatory Visit: Payer: Self-pay

## 2022-10-05 ENCOUNTER — Emergency Department (HOSPITAL_BASED_OUTPATIENT_CLINIC_OR_DEPARTMENT_OTHER): Payer: Medicare Other

## 2022-10-05 ENCOUNTER — Emergency Department (HOSPITAL_BASED_OUTPATIENT_CLINIC_OR_DEPARTMENT_OTHER)
Admission: EM | Admit: 2022-10-05 | Discharge: 2022-10-05 | Disposition: A | Discharge status: Home / Self Care | Payer: Medicare Other | Attending: Emergency Medicine | Admitting: Emergency Medicine

## 2022-10-05 ENCOUNTER — Encounter (HOSPITAL_BASED_OUTPATIENT_CLINIC_OR_DEPARTMENT_OTHER): Payer: Self-pay

## 2022-10-05 DIAGNOSIS — M79604 Pain in right leg: Secondary | ICD-10-CM | POA: Diagnosis not present

## 2022-10-05 DIAGNOSIS — E119 Type 2 diabetes mellitus without complications: Secondary | ICD-10-CM | POA: Diagnosis not present

## 2022-10-05 DIAGNOSIS — I739 Peripheral vascular disease, unspecified: Secondary | ICD-10-CM | POA: Insufficient documentation

## 2022-10-05 DIAGNOSIS — Z7901 Long term (current) use of anticoagulants: Secondary | ICD-10-CM | POA: Insufficient documentation

## 2022-10-05 DIAGNOSIS — Z7984 Long term (current) use of oral hypoglycemic drugs: Secondary | ICD-10-CM | POA: Insufficient documentation

## 2022-10-05 DIAGNOSIS — I509 Heart failure, unspecified: Secondary | ICD-10-CM | POA: Insufficient documentation

## 2022-10-05 DIAGNOSIS — Z794 Long term (current) use of insulin: Secondary | ICD-10-CM | POA: Insufficient documentation

## 2022-10-05 DIAGNOSIS — Z7982 Long term (current) use of aspirin: Secondary | ICD-10-CM | POA: Insufficient documentation

## 2022-10-05 DIAGNOSIS — N183 Chronic kidney disease, stage 3 unspecified: Secondary | ICD-10-CM | POA: Insufficient documentation

## 2022-10-05 DIAGNOSIS — I2510 Atherosclerotic heart disease of native coronary artery without angina pectoris: Secondary | ICD-10-CM | POA: Diagnosis not present

## 2022-10-05 DIAGNOSIS — R2241 Localized swelling, mass and lump, right lower limb: Secondary | ICD-10-CM | POA: Diagnosis present

## 2022-10-05 DIAGNOSIS — I130 Hypertensive heart and chronic kidney disease with heart failure and stage 1 through stage 4 chronic kidney disease, or unspecified chronic kidney disease: Secondary | ICD-10-CM | POA: Insufficient documentation

## 2022-10-05 DIAGNOSIS — M791 Myalgia, unspecified site: Secondary | ICD-10-CM | POA: Insufficient documentation

## 2022-10-05 DIAGNOSIS — T466X5A Adverse effect of antihyperlipidemic and antiarteriosclerotic drugs, initial encounter: Secondary | ICD-10-CM

## 2022-10-05 DIAGNOSIS — R600 Localized edema: Secondary | ICD-10-CM | POA: Diagnosis not present

## 2022-10-05 LAB — COMPREHENSIVE METABOLIC PANEL
ALT: 13 U/L (ref 0–44)
AST: 13 U/L — ABNORMAL LOW (ref 15–41)
Albumin: 4.4 g/dL (ref 3.5–5.0)
Alkaline Phosphatase: 68 U/L (ref 38–126)
Anion gap: 11 (ref 5–15)
BUN: 24 mg/dL — ABNORMAL HIGH (ref 8–23)
CO2: 26 mmol/L (ref 22–32)
Calcium: 9.9 mg/dL (ref 8.9–10.3)
Chloride: 99 mmol/L (ref 98–111)
Creatinine, Ser: 1.08 mg/dL (ref 0.61–1.24)
GFR, Estimated: >60 mL/min (ref >60)
Glucose, Bld: 183 mg/dL — ABNORMAL HIGH (ref 70–99)
Potassium: 4.2 mmol/L (ref 3.5–5.1)
Sodium: 136 mmol/L (ref 135–145)
Total Bilirubin: 0.6 mg/dL (ref 0.3–1.2)
Total Protein: 7.6 g/dL (ref 6.5–8.1)

## 2022-10-05 LAB — CBC WITH DIFFERENTIAL/PLATELET
Abs Immature Granulocytes: 0.1 10*3/uL — ABNORMAL HIGH (ref 0.00–0.07)
Basophils Absolute: 0.1 10*3/uL (ref 0.0–0.1)
Basophils Relative: 1 %
Eosinophils Absolute: 0.5 10*3/uL (ref 0.0–0.5)
Eosinophils Relative: 4 %
HCT: 42.4 % (ref 39.0–52.0)
Hemoglobin: 14.5 g/dL (ref 13.0–17.0)
Immature Granulocytes: 1 %
Lymphocytes Relative: 12 %
Lymphs Abs: 1.4 10*3/uL (ref 0.7–4.0)
MCH: 32 pg (ref 26.0–34.0)
MCHC: 34.2 g/dL (ref 30.0–36.0)
MCV: 93.6 fL (ref 80.0–100.0)
Monocytes Absolute: 0.8 10*3/uL (ref 0.1–1.0)
Monocytes Relative: 7 %
Neutro Abs: 9.1 10*3/uL — ABNORMAL HIGH (ref 1.7–7.7)
Neutrophils Relative %: 75 %
Platelets: 263 10*3/uL (ref 150–400)
RBC: 4.53 MIL/uL (ref 4.22–5.81)
RDW: 13.1 % (ref 11.5–15.5)
WBC: 11.9 10*3/uL — ABNORMAL HIGH (ref 4.0–10.5)
nRBC: 0 % (ref 0.0–0.2)

## 2022-10-05 LAB — MAGNESIUM: Magnesium: 1.6 mg/dL — ABNORMAL LOW (ref 1.7–2.4)

## 2022-10-05 LAB — CK: Total CK: 34 U/L — ABNORMAL LOW (ref 49–397)

## 2022-10-05 MED ORDER — MAGNESIUM OXIDE -MG SUPPLEMENT 400 (240 MG) MG PO TABS
400.0000 mg | ORAL_TABLET | Freq: Once | ORAL | Status: AC
  Administered 2022-10-05: 400 mg via ORAL
  Filled 2022-10-05: qty 1

## 2022-10-05 MED ORDER — DIPHENHYDRAMINE HCL 25 MG PO CAPS
50.0000 mg | ORAL_CAPSULE | Freq: Once | ORAL | Status: AC
  Administered 2022-10-05: 50 mg via ORAL
  Filled 2022-10-05: qty 2

## 2022-10-05 NOTE — Discharge Instructions (Addendum)
Please follow-up with your vascular surgeon regarding your peripheral vascular disease and poor circulation.  Your ultrasound was negative for a deep venous thrombosis.  Your electrolytes were normal with the exception of mildly low magnesium which was replenished orally and should not be causing significant muscle aches.  Stop your Crestor which can cause diffuse muscle aches and follow-up with the prescribing doctor for alternative medication options.  You have intact pulses in both feet although there is evidence of poor circulation in both feet with diminished pulses for which you are scheduled for outpatient surgery with Dr. Randie Heinz.  Additionally, your chest rash could be a reaction to the initiation of your statin for which we recommend you stop taking the medication (Crestor).  If you continue to experience an itchy rash after stopping your Crestor, Plavix could be the cause.  Recommend you follow-up with the doctor that prescribed this medication to seek alternatives.

## 2022-10-05 NOTE — ED Provider Notes (Incomplete)
[place] EMERGENCY DEPARTMENT AT [facility] Provider Note   CSN: [id number] Arrival date & time: 10/05/22  1236     History  Chief Complaint  Patient presents with   Foot Problem    right   Rash    THEODORIC TEET is a 77 y.o. male.   Rash    77 year old male with medical history significant for AAA, PVD, ischemic cardiomyopathy, CHF, CHB w/ biventricular ICD in place, idiopathic pulmonary fibrosis, CKD stage III, CAD, DM2, HTN who presents to the emergency department with right foot swelling and redness and diffuse itching.  The patient states that he recently underwent cardiac catheterization and has been started on Plavix and was switched to Crestor and started taking the medication.  He states that he has had diffuse whole body itching with a whole-body rash present.  He endorses muscle aches in his legs.  His right leg is more swollen than the left.  He has peripheral vascular disease and is scheduled for arterial bypass later this month.  He first has upcoming CTO-RCA intervention scheduled in the Cath Lab with cardiology on 10/10/2022 and arterial right common femoral endarterectomy and by cross graft Femoral-popliteal artery with Dr. Randie Heinz of vascular surgery scheduled for 6/25/202.  He endorses neuropathy in his feet bilaterally, follows outpatient with podiatry for wounds to his feet.  He endorses new redness and swelling to the right lower extremity without pain, warmth, fever or chills.  Had antibiotics over the last 2 weeks prescribed by podiatry.  He is wondering is if he is having a reaction to his new statin or to his Plavix.  He denies any chest pain or shortness of breath.  He denies any lower extremity significant pain, just cramping in the right lower extremity.  Home Medications Prior to Admission medications   Medication Sig Start Date End Date Taking? Authorizing Provider  albuterol (VENTOLIN HFA) 108 (90 Base) MCG/ACT inhaler Inhale 2 puffs into the lungs  every 6 (six) hours as needed for wheezing or shortness of breath. 09/03/22   Kalman Shan, MD  amLODipine (NORVASC) 10 MG tablet TAKE 1 TABLET(10 MG) BY MOUTH DAILY Patient taking differently: Take 10 mg by mouth daily with supper. 11/20/21   Jake Bathe, MD  aspirin EC 81 MG tablet Take 1 tablet (81 mg total) by mouth daily. 09/18/22   Yvonna Alanis L, PA-C  carvedilol (COREG) 25 MG tablet TAKE 1 TABLET BY MOUTH TWICE DAILY WITH MEALS 12/17/21   Jake Bathe, MD  Cholecalciferol (VITAMIN D3) 50 MCG (2000 UT) TABS Take 2,000 Units by mouth in the morning.    [provider]  clopidogrel (PLAVIX) 75 MG tablet Take 1 tablet (75 mg total) by mouth daily with breakfast. 09/19/22   Abagail Kitchens, PA-C  Cyanocobalamin (VITAMIN B-12 PO) Take 2,000 mcg by mouth in the morning.    [provider]  ESBRIET 267 MG TABS Take 2 tablets (534 mg total) by mouth in the morning, at noon, and at bedtime. **NOTE LOW DOSE AS MAINTENANCE** 06/26/22   Kalman Shan, MD  famotidine (PEPCID) 20 MG tablet Take 1 tablet (20 mg total) by mouth at bedtime. 07/19/22   Napoleon Form, MD  glipiZIDE (GLUCOTROL) 5 MG tablet TAKE 1 TABLET(5 MG) BY MOUTH TWICE DAILY BEFORE A MEAL 03/12/22   [name], MD  glucose blood (ACCU-CHEK AVIVA PLUS) test strip AS DIRECTED TO CHECK BLOOD SUGAR TWICE DAILY 04/22/22   [name], MD  insulin glargine (LANTUS SOLOSTAR) 100 UNIT/ML Solostar Pen INJECT 0.3 TO 0.35 MLS(30 TO 35 UNITS) INTO THE SKIN EVERY DAY Patient taking differently: Inject 30 Units into the skin at bedtime. 03/29/20   [name], MD  isosorbide mononitrate (IMDUR) 30 MG 24 hr tablet Take 1 tablet (30 mg total) by mouth daily. Patient taking differently: Take 30 mg by mouth daily with supper. 04/16/22   [name], MD  metFORMIN (GLUCOPHAGE) 500 MG tablet TAKE 2 TABLETS BY MOUTH TWICE DAILY WITH FOOD 03/04/22   [name], MD  Multiple Vitamin (MULTIVITAMIN WITH  MINERALS) TABS tablet Take 1 tablet by mouth in the morning.    [provider]  pantoprazole (PROTONIX) 40 MG tablet Take 2 tablets (80 mg total) by mouth daily. 09/18/22   Abagail Kitchens, PA-C  rosuvastatin (CRESTOR) 20 MG tablet Take 1 tablet (20 mg total) by mouth daily. 09/18/22   Abagail Kitchens, PA-C  umeclidinium bromide (INCRUSE ELLIPTA) 62.5 MCG/ACT AEPB Inhale 1 puff into the lungs daily. 09/06/22   Kalman Shan, MD  valsartan (DIOVAN) 40 MG tablet Take 20 mg by mouth daily with supper.    [provider]      Allergies    Aldactone [spironolactone], Codeine, Atorvastatin, Januvia [sitagliptin], Jardiance [empagliflozin], and Lisinopril    Review of Systems   Review of Systems  Skin:  Positive for rash.  All other systems reviewed and are negative.   Physical Exam Updated Vital Signs BP (!) 167/95 (BP Location: Right Arm)   Pulse 80   Temp 97.9 F (36.6 C) (Oral)   Resp 20   Ht 6\' 2"  (1.88 m)   Wt 83 kg   SpO2 100%   BMI 23.49 kg/m  Physical Exam Vitals and nursing note reviewed.  Constitutional:      General: He is not in acute distress.    Appearance: He is well-developed.  HENT:     Head: Normocephalic and atraumatic.  Eyes:     Conjunctiva/sclera: Conjunctivae normal.  Cardiovascular:     Rate and Rhythm: Normal rate and regular rhythm.     Heart sounds: No murmur heard. Pulmonary:     Effort: Pulmonary effort is normal. No respiratory distress.     Breath sounds: Normal breath sounds.  Chest:     Comments: Erythematous rash to the chest Abdominal:     Palpations: Abdomen is soft.     Tenderness: There is no abdominal tenderness.  Musculoskeletal:        General: No swelling or tenderness.     Cervical back: Neck supple.     Right lower leg: Edema present.     Comments: 1+ palpable pulses bilaterally, 2+ swelling in the right lower extremity, asymmetric compared to the left, discoloration noted throughout the foot radiating up the  right lower extremity  Skin:    General: Skin is warm and dry.     Capillary Refill: Capillary refill takes less than 2 seconds.  Neurological:     Mental Status: He is alert.  Psychiatric:        Mood and Affect: Mood normal.     ED Results / Procedures / Treatments   Labs (all labs ordered are listed, but only abnormal results are displayed) Labs Reviewed - No data to display  EKG None  Radiology No results found.  Procedures Procedures    Medications Ordered in ED Medications - No data to display  ED Course/ Medical Decision Making/ A&P  Medical Decision Making Amount and/or Complexity of Data Reviewed Labs: ordered.     77 year old male with medical history significant for AAA, PVD, ischemic cardiomyopathy, CHF, CHB w/ biventricular ICD in place, idiopathic pulmonary fibrosis, CKD stage III, CAD, DM2, HTN who presents to the emergency department with right foot swelling and redness and diffuse itching.  The patient states that he recently underwent cardiac catheterization and has been started on Plavix and was switched to Crestor and started taking the medication.  He states that he has had diffuse whole body itching with a whole-body rash present.  He endorses muscle aches in his legs.  His right leg is more swollen than the left.  He has peripheral vascular disease and is scheduled for arterial bypass later this month.  He first has upcoming CTO-RCA intervention scheduled in the Cath Lab with cardiology on 10/10/2022 and arterial right common femoral endarterectomy and by cross graft Femoral-popliteal artery with Dr. Randie Heinz of vascular surgery scheduled for 6/25/202.  He endorses neuropathy in his feet bilaterally, follows outpatient with podiatry for wounds to his feet.  He endorses new redness and swelling to the right lower extremity without pain, warmth, fever or chills.  Had antibiotics over the last 2 weeks prescribed by podiatry.  He is  wondering is if he is having a reaction to his new statin or to his Plavix.  He denies any chest pain or shortness of breath.  He denies any lower extremity significant pain, just cramping in the right lower extremity.  On arrival, the patient was vitally stable.  Physical exam concerning for a discolored right lower extremity, intact palpable pulses 1+ bilaterally, no warmth, bluish discoloration noted to the right foot with associated swelling that is asymmetric compared to the left lower extremity.  Considered DVT.  Less likely cellulitis.  Patient additionally presenting with a rash to the chest, considered reaction to the patient's Crestor.  Given the patient's cramping, considered electrolyte abnormality, considered rhabdomyolysis although the patient denies any dark tea colored urine.  ***  {Document critical care time when appropriate:1} {Document review of labs and clinical decision tools ie heart score, Chads2Vasc2 etc:1}  {Document your independent review of radiology images, and any outside records:1} {Document your discussion with family members, caretakers, and with consultants:1} {Document social determinants of health affecting pt's care:1} {Document your decision making why or why not admission, treatments were needed:1} Final Clinical Impression(s) / ED Diagnoses Final diagnoses:  None    Rx / DC Orders ED Discharge Orders     None

## 2022-10-05 NOTE — ED Notes (Signed)
Ultrasound at bedside

## 2022-10-05 NOTE — ED Triage Notes (Addendum)
Patient arrives with complaints of new onset rash to his chest x2 days. He is unsure what is causing the rash.  Patient was recently started on new meds after a heart stent a few weeks.  He also has a chronic wound to his foot that he has been treated with antibiotics x2 weeks. Also reports redness to foot.   Reports no pain

## 2022-10-07 ENCOUNTER — Ambulatory Visit (HOSPITAL_BASED_OUTPATIENT_CLINIC_OR_DEPARTMENT_OTHER): Payer: Medicare Other | Admitting: Family

## 2022-10-07 ENCOUNTER — Telehealth (HOSPITAL_BASED_OUTPATIENT_CLINIC_OR_DEPARTMENT_OTHER): Payer: Self-pay

## 2022-10-07 ENCOUNTER — Other Ambulatory Visit (HOSPITAL_BASED_OUTPATIENT_CLINIC_OR_DEPARTMENT_OTHER): Payer: Self-pay

## 2022-10-07 ENCOUNTER — Emergency Department (HOSPITAL_BASED_OUTPATIENT_CLINIC_OR_DEPARTMENT_OTHER)
Admission: EM | Admit: 2022-10-07 | Discharge: 2022-10-07 | Disposition: A | Discharge status: Home / Self Care | Payer: Medicare Other | Attending: Emergency Medicine | Admitting: Emergency Medicine

## 2022-10-07 ENCOUNTER — Encounter (HOSPITAL_BASED_OUTPATIENT_CLINIC_OR_DEPARTMENT_OTHER): Payer: Self-pay | Admitting: Emergency Medicine

## 2022-10-07 ENCOUNTER — Other Ambulatory Visit: Payer: Self-pay

## 2022-10-07 DIAGNOSIS — L299 Pruritus, unspecified: Secondary | ICD-10-CM | POA: Insufficient documentation

## 2022-10-07 DIAGNOSIS — L270 Generalized skin eruption due to drugs and medicaments taken internally: Secondary | ICD-10-CM | POA: Diagnosis not present

## 2022-10-07 DIAGNOSIS — E1122 Type 2 diabetes mellitus with diabetic chronic kidney disease: Secondary | ICD-10-CM | POA: Insufficient documentation

## 2022-10-07 DIAGNOSIS — I2510 Atherosclerotic heart disease of native coronary artery without angina pectoris: Secondary | ICD-10-CM | POA: Insufficient documentation

## 2022-10-07 DIAGNOSIS — N183 Chronic kidney disease, stage 3 unspecified: Secondary | ICD-10-CM | POA: Diagnosis not present

## 2022-10-07 DIAGNOSIS — R21 Rash and other nonspecific skin eruption: Secondary | ICD-10-CM | POA: Diagnosis not present

## 2022-10-07 DIAGNOSIS — I509 Heart failure, unspecified: Secondary | ICD-10-CM | POA: Diagnosis not present

## 2022-10-07 DIAGNOSIS — Z794 Long term (current) use of insulin: Secondary | ICD-10-CM | POA: Diagnosis not present

## 2022-10-07 DIAGNOSIS — I130 Hypertensive heart and chronic kidney disease with heart failure and stage 1 through stage 4 chronic kidney disease, or unspecified chronic kidney disease: Secondary | ICD-10-CM | POA: Diagnosis not present

## 2022-10-07 MED ORDER — HYDROXYZINE HCL 25 MG PO TABS
25.0000 mg | ORAL_TABLET | Freq: Four times a day (QID) | ORAL | 0 refills | Status: DC | PRN
  Filled 2022-10-07: qty 30, 8d supply, fill #0

## 2022-10-07 MED ORDER — TICAGRELOR 90 MG PO TABS
90.0000 mg | ORAL_TABLET | Freq: Two times a day (BID) | ORAL | 1 refills | Status: DC
  Filled 2022-10-07: qty 60, 30d supply, fill #0

## 2022-10-07 MED ORDER — TICAGRELOR 90 MG PO TABS: 90.0000 mg | ORAL_TABLET | Freq: Two times a day (BID) | ORAL | 0 refills | Status: DC

## 2022-10-07 MED ORDER — DEXAMETHASONE SODIUM PHOSPHATE 10 MG/ML IJ SOLN
10.0000 mg | Freq: Once | INTRAMUSCULAR | Status: AC
  Administered 2022-10-07: 10 mg via INTRAMUSCULAR
  Filled 2022-10-07: qty 1

## 2022-10-07 NOTE — Telephone Encounter (Signed)
FYI

## 2022-10-07 NOTE — ED Triage Notes (Addendum)
Pt arrives to ED with c/o hives to his chest, back, legs that started yesterday. He notes he has had a rash x1 week which worsened yesterday. Pt was seen on 6/1 and was told to d/c his crestor as this could be the cause which he stopped taking taking x2 days ago.

## 2022-10-07 NOTE — ED Provider Notes (Signed)
[place] EMERGENCY DEPARTMENT AT [facility] Provider Note   CSN: [id number] Arrival date & time: 10/07/22  1251     History  Chief Complaint  Patient presents with   Rash    Luke [name] is a 77 y.o. male past medical tree significant for AAA, PVD, ischemic cardiomyopathy, CHF, CHB with biventricular ICD in place, pulmonary fibrosis, CKD stage III, CAD, type 2 diabetes with insulin dependence, hypertension, recent heart cath with PCI scheduled for coronary of CTO PCI of the RCA on 10/10/2022 who presents with diffuse erythematous pruritic rash.  He was seen in the emergency department 2 days ago with swelling and redness and diffuse itching of his right foot.  He had a medication and on 09/16/2022 and was changed to Plavix and Crestor which are new.  Patient had an extensive workup in the emergency department that included labs which were rather unremarkable, right lower extremity DVT study which was negative, he was discharged to take Benadryl states that his rash got much worse after that.  Patient returns due to generalized erythematous rash which is worsening and he is concerned for allergy to either other medications or Benadryl.   Rash      Home Medications Prior to Admission medications   Medication Sig Start Date End Date Taking? Authorizing Provider  albuterol (VENTOLIN HFA) 108 (90 Base) MCG/ACT inhaler Inhale 2 puffs into the lungs every 6 (six) hours as needed for wheezing or shortness of breath. 09/03/22   Kalman Shan, MD  amLODipine (NORVASC) 10 MG tablet TAKE 1 TABLET(10 MG) BY MOUTH DAILY Patient taking differently: Take 10 mg by mouth daily with supper. 11/20/21   Jake Bathe, MD  aspirin EC 81 MG tablet Take 1 tablet (81 mg total) by mouth daily. 09/18/22   Yvonna Alanis L, PA-C  carvedilol (COREG) 25 MG tablet TAKE 1 TABLET BY MOUTH TWICE DAILY WITH MEALS 12/17/21   Jake Bathe, MD  Cholecalciferol (VITAMIN D3) 50 MCG (2000 UT) TABS Take 2,000  Units by mouth in the morning.    [provider]  clopidogrel (PLAVIX) 75 MG tablet Take 1 tablet (75 mg total) by mouth daily with breakfast. 09/19/22   Abagail Kitchens, PA-C  Cyanocobalamin (VITAMIN B-12 PO) Take 2,000 mcg by mouth in the morning.    [provider]  ESBRIET 267 MG TABS Take 2 tablets (534 mg total) by mouth in the morning, at noon, and at bedtime. **NOTE LOW DOSE AS MAINTENANCE** 06/26/22   Kalman Shan, MD  famotidine (PEPCID) 20 MG tablet Take 1 tablet (20 mg total) by mouth at bedtime. 07/19/22   Napoleon Form, MD  glipiZIDE (GLUCOTROL) 5 MG tablet TAKE 1 TABLET(5 MG) BY MOUTH TWICE DAILY BEFORE A MEAL 03/12/22   [name], MD  glucose blood (ACCU-CHEK AVIVA PLUS) test strip AS DIRECTED TO CHECK BLOOD SUGAR TWICE DAILY 04/22/22   [name], MD  insulin glargine (LANTUS SOLOSTAR) 100 UNIT/ML Solostar Pen INJECT 0.3 TO 0.35 MLS(30 TO 35 UNITS) INTO THE SKIN EVERY DAY Patient taking differently: Inject 30 Units into the skin at bedtime. 03/29/20   [name], MD  isosorbide mononitrate (IMDUR) 30 MG 24 hr tablet Take 1 tablet (30 mg total) by mouth daily. Patient taking differently: Take 30 mg by mouth daily with supper. 04/16/22   [name], MD  metFORMIN (GLUCOPHAGE) 500 MG tablet TAKE 2 TABLETS BY MOUTH TWICE DAILY WITH FOOD 03/04/22   [name],  MD  Multiple Vitamin (MULTIVITAMIN WITH MINERALS) TABS tablet Take 1 tablet by mouth in the morning.    [provider]  pantoprazole (PROTONIX) 40 MG tablet Take 2 tablets (80 mg total) by mouth daily. 09/18/22   Abagail Kitchens, PA-C  rosuvastatin (CRESTOR) 20 MG tablet Take 1 tablet (20 mg total) by mouth daily. 09/18/22   Abagail Kitchens, PA-C  umeclidinium bromide (INCRUSE ELLIPTA) 62.5 MCG/ACT AEPB Inhale 1 puff into the lungs daily. 09/06/22   Kalman Shan, MD  valsartan (DIOVAN) 40 MG tablet Take 20 mg by mouth daily with supper.    [provider]       Allergies    Aldactone [spironolactone], Codeine, Atorvastatin, Januvia [sitagliptin], Jardiance [empagliflozin], and Lisinopril    Review of Systems   Review of Systems  Skin:  Positive for rash.    Physical Exam Updated Vital Signs BP (!) 141/78 (BP Location: Right Arm)   Pulse 69   Temp 98 F (36.7 C)   Resp 18   SpO2 96%  Physical Exam Vitals and nursing note reviewed.  Constitutional:      General: He is not in acute distress.    Appearance: He is well-developed. He is not diaphoretic.  HENT:     Head: Normocephalic and atraumatic.  Eyes:     General: No scleral icterus.    Conjunctiva/sclera: Conjunctivae normal.  Cardiovascular:     Rate and Rhythm: Normal rate and regular rhythm.     Heart sounds: Normal heart sounds.  Pulmonary:     Effort: Pulmonary effort is normal. No respiratory distress.     Breath sounds: Normal breath sounds.  Abdominal:     Palpations: Abdomen is soft.     Tenderness: There is no abdominal tenderness.  Musculoskeletal:     Cervical back: Normal range of motion and neck supple.  Skin:    General: Skin is warm and dry.     Findings: Erythema and rash present.     Comments: Generalized morbilliform erythematous rash which is confluent over the back face and chest.  More sparse over the extremities.  No bullae or desquamation noted  Neurological:     Mental Status: He is alert.  Psychiatric:        Behavior: Behavior normal.     ED Results / Procedures / Treatments   Labs (all labs ordered are listed, but only abnormal results are displayed) Labs Reviewed - No data to display  EKG None  Radiology No results found.  Procedures Procedures    Medications Ordered in ED Medications - No data to display  ED Course/ Medical Decision Making/ A&P                             Medical Decision Making Risk Prescription drug management.   Patient with generalized rash which is pruritic.  He was seen for similar rash as  well as myalgias.  Taken off of his Crestor however continues to have these issues.  Suspect drug rash related to Plavix.  Patient does not appear to have dress based on recent labs, TENS, SJS, doubt any other infective symptoms such as [facility] spotted fever.  Do not think he needs repeat of his labs today. Seen and shared visit with Dr. Karene Fry agrees with my assessment and plan.  I reached out to the patient's cardiology NP, Gillian Shields about changing him to another type platelet medication.  Will change  him to Brilinta 90 mg twice daily per her recommendation.  They may discuss continuation of statins at their visit.  He was given a dose of Decadron IM for systemic itching and generalized eruption.  Patient appears otherwise appropriate for discharge at this time, discussed return precautions and outpatient patient follow-up.        Final Clinical Impression(s) / ED Diagnoses Final diagnoses:  None    Rx / DC Orders ED Discharge Orders     None         Arthor Captain, PA-C 10/07/22 1746    Ernie Avena, MD 10/07/22 1907

## 2022-10-07 NOTE — Telephone Encounter (Signed)
Patient seen in ED today for rash thought to be caused by Plavix. Per Gillian Shields, NP have the patient to switch to Brilinta 90mg  twice daily . Okay to provide samples and coupon card. Samples logged according to policy.

## 2022-10-07 NOTE — Discharge Instructions (Addendum)
STOP taking plavix. Start taking brillinta. Use atarax for itching.  Keep an eye on your blood sugars at home. Contact a health care provider if: You have fever. Your rash is not going away. Your rash gets worse. Your rash comes back. You have high-pitched whistling sounds when you breathe, most often when you breathe out (wheezing) or coughing. Get help right away if: You start to have breathing problems. You start to have shortness of breath. Your face or throat starts to swell. You have severe weakness with dizziness or fainting. You have chest pain. Your skin starts to blister and peel. These symptoms may represent a serious problem that is an emergency. Do not wait to see if the symptoms will go away. Get medical help right away. Call your local emergency services (911 in the [place] Do not drive yourself to the hospital.

## 2022-10-07 NOTE — ED Notes (Signed)
Sample medications were given to pt along with information packet.  Pt shown out and instructions given to get to pharmacy to pick up medications

## 2022-10-08 ENCOUNTER — Other Ambulatory Visit (HOSPITAL_COMMUNITY): Payer: Self-pay

## 2022-10-09 ENCOUNTER — Telehealth: Payer: Self-pay | Admitting: *Deleted

## 2022-10-09 NOTE — Telephone Encounter (Addendum)
Patient reports recently (10/07/22), due to concerns about rash from Plavix, Plavix switched to Brilinta 90 bid.  Patient reports rash is improving. Patient states he had to stop his walk on treadmill yesterday sooner than he usually would due to shortness of breath. Patient states he thought maybe Brilinta may have something to do with his shortness of breath. Patient denies shortness of breath at rest, and acknowledges he has had some shortness of breath prior to starting Brilinta. Patient advised to take it easy today (today is his birthday!), avoid exercise, let [place] know if symptoms get worse.   Patient feels he can continue Brilinta and knows he needs to take for procedure tomorrow.

## 2022-10-09 NOTE — Telephone Encounter (Signed)
Coronary CTO scheduled at [facility] for: Thursday October 10, 2022 7:30 AM Arrival time [facility] Main Entrance A at: 5:30 AM  Nothing to eat after midnight prior to procedure, clear liquids until 5 AM day of procedure.  Medication instructions: -Hold:  Metformin-day of procedure and 48 hours post procedure  Glipizide-AM of procedure  Insulin-1/2 usual dose HS prior to procedure  Patient reports he does not take Insulin in the AM -Other usual morning medications can be taken with sips of water including aspirin 81 mg and Brilinta 90 mg.  Confirmed patient has responsible adult to drive home post procedure and be with patient first 24 hours after arriving home.  Plan to go home the same day, you will only stay overnight if medically necessary.  Reviewed procedure instructions with patient.

## 2022-10-09 NOTE — Telephone Encounter (Signed)
SHOB may be related to Brilinta.  Will monitor.,  If it persists, could switch to prasugrel if no cross reactivity with Plavix.

## 2022-10-10 ENCOUNTER — Other Ambulatory Visit: Payer: Self-pay

## 2022-10-10 ENCOUNTER — Ambulatory Visit (HOSPITAL_COMMUNITY)
Admission: RE | Admit: 2022-10-10 | Discharge: 2022-10-11 | Disposition: A | Discharge status: Home / Self Care | Payer: Medicare Other | Attending: Interventional Cardiology | Admitting: Interventional Cardiology

## 2022-10-10 ENCOUNTER — Ambulatory Visit (HOSPITAL_COMMUNITY)
Admission: RE | Disposition: A | Discharge status: Home / Self Care | Payer: Self-pay | Source: Home / Self Care | Attending: Interventional Cardiology

## 2022-10-10 ENCOUNTER — Encounter (HOSPITAL_COMMUNITY): Payer: Self-pay | Admitting: Interventional Cardiology

## 2022-10-10 DIAGNOSIS — N1831 Chronic kidney disease, stage 3a: Secondary | ICD-10-CM | POA: Insufficient documentation

## 2022-10-10 DIAGNOSIS — I25118 Atherosclerotic heart disease of native coronary artery with other forms of angina pectoris: Secondary | ICD-10-CM | POA: Diagnosis not present

## 2022-10-10 DIAGNOSIS — E785 Hyperlipidemia, unspecified: Secondary | ICD-10-CM | POA: Diagnosis present

## 2022-10-10 DIAGNOSIS — I447 Left bundle-branch block, unspecified: Secondary | ICD-10-CM | POA: Diagnosis present

## 2022-10-10 DIAGNOSIS — I5020 Unspecified systolic (congestive) heart failure: Secondary | ICD-10-CM | POA: Diagnosis not present

## 2022-10-10 DIAGNOSIS — I2584 Coronary atherosclerosis due to calcified coronary lesion: Secondary | ICD-10-CM | POA: Insufficient documentation

## 2022-10-10 DIAGNOSIS — I2582 Chronic total occlusion of coronary artery: Secondary | ICD-10-CM | POA: Insufficient documentation

## 2022-10-10 DIAGNOSIS — I428 Other cardiomyopathies: Secondary | ICD-10-CM | POA: Insufficient documentation

## 2022-10-10 DIAGNOSIS — N183 Chronic kidney disease, stage 3 unspecified: Secondary | ICD-10-CM | POA: Diagnosis present

## 2022-10-10 DIAGNOSIS — I739 Peripheral vascular disease, unspecified: Secondary | ICD-10-CM | POA: Diagnosis not present

## 2022-10-10 DIAGNOSIS — I255 Ischemic cardiomyopathy: Secondary | ICD-10-CM | POA: Diagnosis present

## 2022-10-10 DIAGNOSIS — Z955 Presence of coronary angioplasty implant and graft: Secondary | ICD-10-CM

## 2022-10-10 DIAGNOSIS — Z7984 Long term (current) use of oral hypoglycemic drugs: Secondary | ICD-10-CM | POA: Insufficient documentation

## 2022-10-10 DIAGNOSIS — E1122 Type 2 diabetes mellitus with diabetic chronic kidney disease: Secondary | ICD-10-CM | POA: Diagnosis not present

## 2022-10-10 DIAGNOSIS — I130 Hypertensive heart and chronic kidney disease with heart failure and stage 1 through stage 4 chronic kidney disease, or unspecified chronic kidney disease: Secondary | ICD-10-CM | POA: Diagnosis not present

## 2022-10-10 DIAGNOSIS — I209 Angina pectoris, unspecified: Secondary | ICD-10-CM | POA: Diagnosis present

## 2022-10-10 DIAGNOSIS — J84112 Idiopathic pulmonary fibrosis: Secondary | ICD-10-CM | POA: Diagnosis present

## 2022-10-10 DIAGNOSIS — Z794 Long term (current) use of insulin: Secondary | ICD-10-CM | POA: Insufficient documentation

## 2022-10-10 DIAGNOSIS — I2510 Atherosclerotic heart disease of native coronary artery without angina pectoris: Secondary | ICD-10-CM | POA: Diagnosis present

## 2022-10-10 DIAGNOSIS — Z95810 Presence of automatic (implantable) cardiac defibrillator: Secondary | ICD-10-CM | POA: Diagnosis present

## 2022-10-10 DIAGNOSIS — E1169 Type 2 diabetes mellitus with other specified complication: Secondary | ICD-10-CM | POA: Diagnosis present

## 2022-10-10 DIAGNOSIS — I10 Essential (primary) hypertension: Secondary | ICD-10-CM | POA: Diagnosis present

## 2022-10-10 DIAGNOSIS — E1159 Type 2 diabetes mellitus with other circulatory complications: Secondary | ICD-10-CM | POA: Diagnosis present

## 2022-10-10 HISTORY — PX: CORONARY CTO INTERVENTION: CATH118236

## 2022-10-10 HISTORY — PX: LEFT HEART CATH: CATH118248

## 2022-10-10 LAB — POCT ACTIVATED CLOTTING TIME
Activated Clotting Time: 250 seconds
Activated Clotting Time: 397 seconds
Activated Clotting Time: 483 seconds
Activated Clotting Time: 281 seconds
Activated Clotting Time: 580 seconds
Activated Clotting Time: 250 seconds
Activated Clotting Time: 244 seconds
Activated Clotting Time: 269 seconds
Activated Clotting Time: 165 seconds
Activated Clotting Time: 177 seconds

## 2022-10-10 LAB — GLUCOSE, CAPILLARY
Glucose-Capillary: 161 mg/dL — ABNORMAL HIGH (ref 70–99)
Glucose-Capillary: 130 mg/dL — ABNORMAL HIGH (ref 70–99)
Glucose-Capillary: 173 mg/dL — ABNORMAL HIGH (ref 70–99)

## 2022-10-10 SURGERY — CORONARY CTO INTERVENTION
Anesthesia: LOCAL

## 2022-10-10 MED ORDER — SODIUM CHLORIDE 0.9% FLUSH: 3.0000 mL | INTRAVENOUS | Status: DC | PRN

## 2022-10-10 MED ORDER — HYDROXYZINE HCL 25 MG PO TABS
25.0000 mg | ORAL_TABLET | Freq: Four times a day (QID) | ORAL | Status: DC | PRN
  Administered 2022-10-10 – 2022-10-11 (×2): 25 mg via ORAL
  Filled 2022-10-10 (×2): qty 1

## 2022-10-10 MED ORDER — HEPARIN SODIUM (PORCINE) 1000 UNIT/ML IJ SOLN
INTRAMUSCULAR | Status: AC
  Filled 2022-10-10: qty 10

## 2022-10-10 MED ORDER — FENTANYL CITRATE (PF) 100 MCG/2ML IJ SOLN
INTRAMUSCULAR | Status: DC | PRN
  Administered 2022-10-10: 25 ug via INTRAVENOUS

## 2022-10-10 MED ORDER — ASPIRIN 81 MG PO TBEC: 81.0000 mg | DELAYED_RELEASE_TABLET | Freq: Every day | ORAL | Status: DC

## 2022-10-10 MED ORDER — TICAGRELOR 90 MG PO TABS
90.0000 mg | ORAL_TABLET | Freq: Two times a day (BID) | ORAL | Status: DC
  Administered 2022-10-10 – 2022-10-11 (×2): 90 mg via ORAL
  Filled 2022-10-10 (×2): qty 1

## 2022-10-10 MED ORDER — ASPIRIN 81 MG PO CHEW: 81.0000 mg | CHEWABLE_TABLET | ORAL | Status: DC

## 2022-10-10 MED ORDER — MIDAZOLAM HCL 2 MG/2ML IJ SOLN
INTRAMUSCULAR | Status: AC
  Filled 2022-10-10: qty 2

## 2022-10-10 MED ORDER — NITROGLYCERIN 1 MG/10 ML FOR IR/CATH LAB
INTRA_ARTERIAL | Status: DC | PRN
  Administered 2022-10-10 (×2): 200 ug via INTRACORONARY

## 2022-10-10 MED ORDER — NITROGLYCERIN 1 MG/10 ML FOR IR/CATH LAB
INTRA_ARTERIAL | Status: AC
  Filled 2022-10-10: qty 10

## 2022-10-10 MED ORDER — GLIPIZIDE 10 MG PO TABS
10.0000 mg | ORAL_TABLET | Freq: Two times a day (BID) | ORAL | Status: DC
  Administered 2022-10-10: 10 mg via ORAL
  Filled 2022-10-10 (×4): qty 1

## 2022-10-10 MED ORDER — HEPARIN SODIUM (PORCINE) 1000 UNIT/ML IJ SOLN
INTRAMUSCULAR | Status: DC | PRN
  Administered 2022-10-10: 3000 [IU] via INTRAVENOUS
  Administered 2022-10-10: 10000 [IU] via INTRAVENOUS
  Administered 2022-10-10: 3000 [IU] via INTRAVENOUS

## 2022-10-10 MED ORDER — IOHEXOL 350 MG/ML SOLN
INTRAVENOUS | Status: DC | PRN
  Administered 2022-10-10: 150 mL

## 2022-10-10 MED ORDER — ROSUVASTATIN CALCIUM 20 MG PO TABS
20.0000 mg | ORAL_TABLET | Freq: Every day | ORAL | Status: DC
  Administered 2022-10-11: 20 mg via ORAL
  Filled 2022-10-10 (×2): qty 1

## 2022-10-10 MED ORDER — ADULT MULTIVITAMIN W/MINERALS CH
1.0000 | ORAL_TABLET | Freq: Every morning | ORAL | Status: DC
  Administered 2022-10-11: 1 via ORAL
  Filled 2022-10-10: qty 1

## 2022-10-10 MED ORDER — SODIUM CHLORIDE 0.9 % WEIGHT BASED INFUSION
1.0000 mL/kg/h | INTRAVENOUS | Status: DC
  Administered 2022-10-10: 1 mL/kg/h via INTRAVENOUS

## 2022-10-10 MED ORDER — CARVEDILOL 25 MG PO TABS
25.0000 mg | ORAL_TABLET | Freq: Two times a day (BID) | ORAL | Status: DC
  Administered 2022-10-10 – 2022-10-11 (×2): 25 mg via ORAL
  Filled 2022-10-10 (×2): qty 1

## 2022-10-10 MED ORDER — SODIUM CHLORIDE 0.9 % IV SOLN
INTRAVENOUS | Status: AC | PRN
  Administered 2022-10-10: 250 mL via INTRAVENOUS

## 2022-10-10 MED ORDER — VERAPAMIL HCL 2.5 MG/ML IV SOLN
INTRAVENOUS | Status: AC
  Filled 2022-10-10: qty 4

## 2022-10-10 MED ORDER — IRBESARTAN 75 MG PO TABS
75.0000 mg | ORAL_TABLET | Freq: Every day | ORAL | Status: DC
  Administered 2022-10-10 – 2022-10-11 (×2): 75 mg via ORAL
  Filled 2022-10-10 (×2): qty 1

## 2022-10-10 MED ORDER — SODIUM CHLORIDE 0.9% FLUSH
3.0000 mL | Freq: Two times a day (BID) | INTRAVENOUS | Status: DC
  Administered 2022-10-10: 3 mL via INTRAVENOUS

## 2022-10-10 MED ORDER — PANTOPRAZOLE SODIUM 40 MG PO TBEC
80.0000 mg | DELAYED_RELEASE_TABLET | Freq: Every day | ORAL | Status: DC
  Administered 2022-10-10: 80 mg via ORAL
  Filled 2022-10-10: qty 2

## 2022-10-10 MED ORDER — INSULIN GLARGINE-YFGN 100 UNIT/ML ~~LOC~~ SOLN
30.0000 [IU] | Freq: Every day | SUBCUTANEOUS | Status: DC
  Administered 2022-10-10: 30 [IU] via SUBCUTANEOUS
  Filled 2022-10-10 (×2): qty 0.3

## 2022-10-10 MED ORDER — ISOSORBIDE MONONITRATE ER 30 MG PO TB24
30.0000 mg | ORAL_TABLET | Freq: Every day | ORAL | Status: DC
  Administered 2022-10-10: 30 mg via ORAL
  Filled 2022-10-10: qty 1

## 2022-10-10 MED ORDER — ONDANSETRON HCL 4 MG/2ML IJ SOLN: 4.0000 mg | Freq: Four times a day (QID) | INTRAMUSCULAR | Status: DC | PRN

## 2022-10-10 MED ORDER — UMECLIDINIUM BROMIDE 62.5 MCG/ACT IN AEPB
1.0000 | INHALATION_SPRAY | Freq: Every day | RESPIRATORY_TRACT | Status: DC
  Filled 2022-10-10: qty 7

## 2022-10-10 MED ORDER — LABETALOL HCL 5 MG/ML IV SOLN: 10.0000 mg | INTRAVENOUS | Status: AC | PRN

## 2022-10-10 MED ORDER — SODIUM CHLORIDE 0.9 % WEIGHT BASED INFUSION: 1.0000 mL/kg/h | INTRAVENOUS | Status: AC

## 2022-10-10 MED ORDER — TICAGRELOR 90 MG PO TABS
90.0000 mg | ORAL_TABLET | ORAL | Status: AC
  Administered 2022-10-10: 90 mg via ORAL
  Filled 2022-10-10: qty 1

## 2022-10-10 MED ORDER — HYDRALAZINE HCL 20 MG/ML IJ SOLN: 10.0000 mg | INTRAMUSCULAR | Status: AC | PRN

## 2022-10-10 MED ORDER — FENTANYL CITRATE (PF) 100 MCG/2ML IJ SOLN
INTRAMUSCULAR | Status: AC
  Filled 2022-10-10: qty 2

## 2022-10-10 MED ORDER — NITROGLYCERIN 1 MG/10 ML FOR IR/CATH LAB
INTRA_ARTERIAL | Status: AC
  Filled 2022-10-10: qty 30

## 2022-10-10 MED ORDER — SODIUM CHLORIDE 0.9 % IV SOLN: 250.0000 mL | INTRAVENOUS | Status: DC | PRN

## 2022-10-10 MED ORDER — PIRFENIDONE 267 MG PO TABS: 2.0000 | ORAL_TABLET | Freq: Three times a day (TID) | ORAL | Status: DC

## 2022-10-10 MED ORDER — HEPARIN (PORCINE) IN NACL 1000-0.9 UT/500ML-% IV SOLN
INTRAVENOUS | Status: DC | PRN
  Administered 2022-10-10 (×3): 500 mL

## 2022-10-10 MED ORDER — AMLODIPINE BESYLATE 10 MG PO TABS
10.0000 mg | ORAL_TABLET | Freq: Every day | ORAL | Status: DC
  Administered 2022-10-10: 10 mg via ORAL
  Filled 2022-10-10: qty 1

## 2022-10-10 MED ORDER — VITAMIN D3 25 MCG (1000 UNIT) PO TABS
2000.0000 [IU] | ORAL_TABLET | Freq: Every morning | ORAL | Status: DC
  Administered 2022-10-11: 2000 [IU] via ORAL
  Filled 2022-10-10 (×2): qty 2

## 2022-10-10 MED ORDER — LIDOCAINE HCL (PF) 1 % IJ SOLN
INTRAMUSCULAR | Status: AC
  Filled 2022-10-10: qty 30

## 2022-10-10 MED ORDER — SODIUM CHLORIDE 0.9 % WEIGHT BASED INFUSION
3.0000 mL/kg/h | INTRAVENOUS | Status: DC
  Administered 2022-10-10: 3 mL/kg/h via INTRAVENOUS

## 2022-10-10 MED ORDER — SODIUM CHLORIDE 0.9% FLUSH: 3.0000 mL | Freq: Two times a day (BID) | INTRAVENOUS | Status: DC

## 2022-10-10 MED ORDER — LIDOCAINE HCL (PF) 1 % IJ SOLN
INTRAMUSCULAR | Status: DC | PRN
  Administered 2022-10-10: 10 mL

## 2022-10-10 MED ORDER — MIDAZOLAM HCL 2 MG/2ML IJ SOLN
INTRAMUSCULAR | Status: DC | PRN
  Administered 2022-10-10: 2 mg via INTRAVENOUS
  Administered 2022-10-10 (×3): 1 mg via INTRAVENOUS

## 2022-10-10 MED ORDER — ACETAMINOPHEN 325 MG PO TABS: 650.0000 mg | ORAL_TABLET | ORAL | Status: DC | PRN

## 2022-10-10 SURGICAL SUPPLY — 37 items
BALLN EMERGE MR 2.0X12 (BALLOONS) ×1
BALLN WOLVERINE 3.00X15 (BALLOONS) ×1
BALLN ~~LOC~~ EMERGE MR 3.5X12 (BALLOONS) ×1
BALLOON EMERGE MR 2.0X12 (BALLOONS) IMPLANT
BALLOON TAKERU 1.5X6 (BALLOONS) IMPLANT
BALLOON WOLVERINE 3.00X15 (BALLOONS) IMPLANT
BALLOON ~~LOC~~ EMERGE MR 3.5X12 (BALLOONS) IMPLANT
BUR ROTAPRO CONNECT 1.25 (BURR) IMPLANT
BURR ROTAPRO CONNECT 1.25 (BURR) ×1
CATH INFINITI 5FR JL4 (CATHETERS) IMPLANT
CATH INFINITI JR4 5F (CATHETERS) IMPLANT
CATH MACH1 8F AL1 90CM (CATHETERS) IMPLANT
CATH OPTICROSS HD (CATHETERS) IMPLANT
CATH TELEPORT (CATHETERS) IMPLANT
CATH TRAPPER 6-8F (CATHETERS) IMPLANT
GUIDEWIRE ROTADRIVE FLOPPY (WIRE) IMPLANT
KIT ENCORE 26 ADVANTAGE (KITS) IMPLANT
KIT HEART LEFT (KITS) ×1 IMPLANT
KIT HEMO VALVE WATCHDOG (MISCELLANEOUS) IMPLANT
KIT MICROPUNCTURE NIT STIFF (SHEATH) IMPLANT
LUBRICANT ROTAGLIDE 20CC VIAL (MISCELLANEOUS) IMPLANT
PACK CARDIAC CATHETERIZATION (CUSTOM PROCEDURE TRAY) ×1 IMPLANT
SHEATH BRITE TIP 8FR 35CM (SHEATH) IMPLANT
SHEATH PINNACLE 5F 10CM (SHEATH) IMPLANT
SHEATH PROBE COVER 6X72 (BAG) IMPLANT
SLED PULL BACK IVUS (MISCELLANEOUS) IMPLANT
STENT MEGATRON 3.5X28 (Permanent Stent) IMPLANT
STENT SYNERGY XD 2.50X24 (Permanent Stent) IMPLANT
STENT SYNERGY XD 3.0X16 (Permanent Stent) IMPLANT
SYNERGY XD 2.50X24 (Permanent Stent) ×1 IMPLANT
SYNERGY XD 3.0X16 (Permanent Stent) ×1 IMPLANT
TRANSDUCER W/STOPCOCK (MISCELLANEOUS) ×1 IMPLANT
TUBING CIL FLEX 10 FLL-RA (TUBING) ×1 IMPLANT
WIRE ASAHI GRAND SLAM 180CM (WIRE) IMPLANT
WIRE ASAHI PROWATER 180CM (WIRE) IMPLANT
WIRE EMERALD 3MM-J .035X150CM (WIRE) IMPLANT
WIRE EMERALD 3MM-J .035X260CM (WIRE) IMPLANT

## 2022-10-10 NOTE — Progress Notes (Signed)
Patient arrived to room 6E29 in NAD, VS stable. Left groin site level 0. Call bell in reach.

## 2022-10-10 NOTE — Progress Notes (Signed)
Site area- left  Site Prior to Removal- 0   Pressure Applied For-  45 MInutes   Bedrest Beginning at - 1458   Manual- Yes   Patient Status During Pull- Stable    Post Pull Groin Site- 0   Post Pull Instructions Given- Yes   Post Pull Pulses Present- Yes  via doppler   Dressing Applied- Tegaderm and Gauze Dressing    Comments:  sheath pulled by 2H RN

## 2022-10-10 NOTE — Interval H&P Note (Signed)
Cath Lab Visit (complete for each Cath Lab visit)  Clinical Evaluation Leading to the Procedure:   ACS: No.  Non-ACS:    Anginal Classification: CCS III  Anti-ischemic medical therapy: Maximal Therapy (2 or more classes of medications)  Non-Invasive Test Results: No non-invasive testing performed  Prior CABG: No previous CABG      History and Physical Interval Note:  10/10/2022 7:41 AM  Luke [name]  has presented today for surgery, with the diagnosis of cto.  The various methods of treatment have been discussed with the patient and family. After consideration of risks, benefits and other options for treatment, the patient has consented to  Procedure(s): CORONARY CTO INTERVENTION (N/A) as a surgical intervention.  The patient's history has been reviewed, patient examined, no change in status, stable for surgery.  I have reviewed the patient's chart and labs.  Questions were answered to the patient's satisfaction.    CTO PCI planned.  Atherectomy is a possibility for today as well.  Explained this to the patient.  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  H/o AAA endograft years ago.    Lance Muss

## 2022-10-11 ENCOUNTER — Telehealth (HOSPITAL_COMMUNITY): Payer: Self-pay

## 2022-10-11 ENCOUNTER — Other Ambulatory Visit (HOSPITAL_COMMUNITY): Payer: Self-pay

## 2022-10-11 ENCOUNTER — Encounter (HOSPITAL_COMMUNITY): Payer: Self-pay | Admitting: Interventional Cardiology

## 2022-10-11 DIAGNOSIS — Z950 Presence of cardiac pacemaker: Secondary | ICD-10-CM | POA: Diagnosis not present

## 2022-10-11 DIAGNOSIS — I5020 Unspecified systolic (congestive) heart failure: Secondary | ICD-10-CM | POA: Diagnosis not present

## 2022-10-11 DIAGNOSIS — I25118 Atherosclerotic heart disease of native coronary artery with other forms of angina pectoris: Secondary | ICD-10-CM | POA: Diagnosis not present

## 2022-10-11 DIAGNOSIS — E785 Hyperlipidemia, unspecified: Secondary | ICD-10-CM | POA: Diagnosis not present

## 2022-10-11 DIAGNOSIS — I25119 Atherosclerotic heart disease of native coronary artery with unspecified angina pectoris: Secondary | ICD-10-CM

## 2022-10-11 DIAGNOSIS — I2582 Chronic total occlusion of coronary artery: Secondary | ICD-10-CM | POA: Diagnosis not present

## 2022-10-11 DIAGNOSIS — E1122 Type 2 diabetes mellitus with diabetic chronic kidney disease: Secondary | ICD-10-CM | POA: Diagnosis not present

## 2022-10-11 DIAGNOSIS — I447 Left bundle-branch block, unspecified: Secondary | ICD-10-CM | POA: Diagnosis not present

## 2022-10-11 DIAGNOSIS — I739 Peripheral vascular disease, unspecified: Secondary | ICD-10-CM

## 2022-10-11 DIAGNOSIS — N1831 Chronic kidney disease, stage 3a: Secondary | ICD-10-CM | POA: Diagnosis not present

## 2022-10-11 DIAGNOSIS — I2584 Coronary atherosclerosis due to calcified coronary lesion: Secondary | ICD-10-CM | POA: Diagnosis not present

## 2022-10-11 DIAGNOSIS — Z794 Long term (current) use of insulin: Secondary | ICD-10-CM | POA: Diagnosis not present

## 2022-10-11 DIAGNOSIS — I428 Other cardiomyopathies: Secondary | ICD-10-CM | POA: Diagnosis not present

## 2022-10-11 DIAGNOSIS — I130 Hypertensive heart and chronic kidney disease with heart failure and stage 1 through stage 4 chronic kidney disease, or unspecified chronic kidney disease: Secondary | ICD-10-CM | POA: Diagnosis not present

## 2022-10-11 DIAGNOSIS — Z7984 Long term (current) use of oral hypoglycemic drugs: Secondary | ICD-10-CM | POA: Diagnosis not present

## 2022-10-11 LAB — CBC
HCT: 39.8 % (ref 39.0–52.0)
Hemoglobin: 13.5 g/dL (ref 13.0–17.0)
MCH: 31.7 pg (ref 26.0–34.0)
MCHC: 33.9 g/dL (ref 30.0–36.0)
MCV: 93.4 fL (ref 80.0–100.0)
Platelets: 270 10*3/uL (ref 150–400)
RBC: 4.26 MIL/uL (ref 4.22–5.81)
RDW: 13.4 % (ref 11.5–15.5)
WBC: 11.8 10*3/uL — ABNORMAL HIGH (ref 4.0–10.5)
nRBC: 0 % (ref 0.0–0.2)

## 2022-10-11 LAB — BASIC METABOLIC PANEL
Anion gap: 11 (ref 5–15)
BUN: 25 mg/dL — ABNORMAL HIGH (ref 8–23)
CO2: 23 mmol/L (ref 22–32)
Calcium: 9.3 mg/dL (ref 8.9–10.3)
Chloride: 104 mmol/L (ref 98–111)
Creatinine, Ser: 1.15 mg/dL (ref 0.61–1.24)
GFR, Estimated: >60 mL/min (ref >60)
Glucose, Bld: 137 mg/dL — ABNORMAL HIGH (ref 70–99)
Potassium: 3.9 mmol/L (ref 3.5–5.1)
Sodium: 138 mmol/L (ref 135–145)

## 2022-10-11 MED ORDER — ISOSORBIDE MONONITRATE ER 30 MG PO TB24
30.0000 mg | ORAL_TABLET | Freq: Every day | ORAL | Status: DC
  Administered 2022-10-11: 30 mg via ORAL
  Filled 2022-10-11: qty 1

## 2022-10-11 MED ORDER — TICAGRELOR 90 MG PO TABS: 90.0000 mg | ORAL_TABLET | Freq: Two times a day (BID) | ORAL | 11 refills | Status: DC

## 2022-10-11 MED FILL — Nitroglycerin IV Soln 100 MCG/ML in D5W: INTRA_ARTERIAL | Qty: 10 | Status: AC

## 2022-10-11 MED FILL — Verapamil HCl IV Soln 2.5 MG/ML: INTRAVENOUS | Qty: 2 | Status: AC

## 2022-10-11 NOTE — Progress Notes (Addendum)
Rounding Note    Patient Name: Luke [name] Date of Encounter: 10/11/2022  [facility] Cardiologist: Donato Schultz, MD   Subjective   Denies any CP or SOB.   Inpatient Medications    Scheduled Meds:  amLODipine  10 mg Oral Q supper   aspirin EC  81 mg Oral QHS   carvedilol  25 mg Oral BID WC   cholecalciferol  2,000 Units Oral q AM   glipiZIDE  10 mg Oral BID AC   insulin glargine-yfgn  30 Units Subcutaneous QHS   irbesartan  75 mg Oral Daily   isosorbide mononitrate  30 mg Oral Q supper   multivitamin with minerals  1 tablet Oral q AM   pantoprazole  80 mg Oral Q supper   Pirfenidone  2 tablet Oral TID   rosuvastatin  20 mg Oral Daily   sodium chloride flush  3 mL Intravenous Q12H   sodium chloride flush  3 mL Intravenous Q12H   ticagrelor  90 mg Oral BID   umeclidinium bromide  1 puff Inhalation Q supper   Continuous Infusions:  sodium chloride     PRN Meds: sodium chloride, acetaminophen, hydrOXYzine, ondansetron (ZOFRAN) IV, sodium chloride flush   Vital Signs    Vitals:   10/10/22 2100 10/10/22 2140 10/10/22 2321 10/11/22 0318  BP:   121/73 (!) 106/58  Pulse: 68 68 68 74  Resp:   18 17  Temp:   98.1 F (36.7 C) 98 F (36.7 C)  TempSrc:   Oral Oral  SpO2: (!) 89% 94% 94% 95%  Weight:      Height:        Intake/Output Summary (Last 24 hours) at 10/11/2022 0750 Last data filed at 10/10/2022 2205 Gross per 24 hour  Intake --  Output 900 ml  Net -900 ml      10/10/2022    6:01 AM 10/05/2022   12:50 PM 10/02/2022    9:53 AM  Last 3 Weights  Weight (lbs) 181 lb 182 lb 15.7 oz 183 lb  Weight (kg) 82.101 kg 83 kg 83.008 kg      Telemetry    Paced rhythm - Personally Reviewed  ECG    Atrial sensed and v paced rhythm - Personally Reviewed  Physical Exam   GEN: No acute distress.   Neck: No JVD Cardiac: RRR, no murmurs, rubs, or gallops. L groin cath site clean dry, no bleeding.  Respiratory: Clear to auscultation bilaterally. GI:  Soft, nontender, non-distended  MS: No edema; No deformity. Neuro:  Nonfocal  Psych: Normal affect   Labs    High Sensitivity Troponin:  No results for input(s): "TROPONINIHS" in the last 720 hours.   Chemistry Recent Labs  Lab 10/05/22 1400 10/11/22 0250  NA 136 138  K 4.2 3.9  CL 99 104  CO2 26 23  GLUCOSE 183* 137*  BUN 24* 25*  CREATININE 1.08 1.15  CALCIUM 9.9 9.3  MG 1.6*  --   PROT 7.6  --   ALBUMIN 4.4  --   AST 13*  --   ALT 13  --   ALKPHOS 68  --   BILITOT 0.6  --   GFRNONAA >60 >60  ANIONGAP 11 11    Lipids No results for input(s): "CHOL", "TRIG", "HDL", "LABVLDL", "LDLCALC", "CHOLHDL" in the last 168 hours.  Hematology Recent Labs  Lab 10/05/22 1400 10/11/22 0250  WBC 11.9* 11.8*  RBC 4.53 4.26  HGB 14.5 13.5  HCT 42.4  39.8  MCV 93.6 93.4  MCH 32.0 31.7  MCHC 34.2 33.9  RDW 13.1 13.4  PLT 263 270   Thyroid No results for input(s): "TSH", "FREET4" in the last 168 hours.  BNPNo results for input(s): "BNP", "PROBNP" in the last 168 hours.  DDimer No results for input(s): "DDIMER" in the last 168 hours.   Radiology    CARDIAC CATHETERIZATION  Result Date: 10/10/2022   Dist RCA lesion is 99% stenosed, functionally a chronic total occlusion.  A drug-eluting stent was successfully placed using a SYNERGY XD 2.50X24, postdilated to 2.9 mm and optimized with intravascular ultrasound.   Post intervention, there is a 0% residual stenosis.   Mid RCA lesion is 75% stenosed.  A drug-eluting stent was successfully placed using a SYNERGY XD 3.0X16, postdilated to 3.5 mm and optimized with intravascular ultrasound.   Post intervention, there is a 0% residual stenosis.   Prox RCA lesion is 75% stenosed.  Severely calcified.  After rotational atherectomy, A drug-eluting stent was successfully placed using a STENT MEGATRON 3.5X28, postdilated to > 3.5 mm and optimized with intravascular ultrasound.   Prox RCA to Mid RCA lesion is 25% stenosed with 25% stenosed side  branch in RV Branch.   Post intervention, there is a 0% residual stenosis.   The left ventricular systolic function is normal.   LV end diastolic pressure is normal.   The left ventricular ejection fraction is 55-65% by visual estimate.   There is no aortic valve stenosis.   In the absence of any other complications or medical issues, we expect the patient to be ready for discharge from an interventional cardiology perspective on 10/11/2022.   Recommend uninterrupted dual antiplatelet therapy with Aspirin 81mg  daily and Ticagrelor 90mg  twice daily for given diffuse disease, would plan longer P2Y12 therapy. Successful CTO PCI of the RCA with stents placed in the distal, mid and proximal RCA.  The distal and mid stents overlap. The patient will be watched overnight.  Results conveyed to his wife. He continues to have a right foot wound with planned vascular surgery in a few weeks.  Will have wound care evaluate the patient in the hospital. Would plan for prolonged more intense antiplatelet therapy given his diffuse CAD and multiple stents.    Cardiac Studies   Cath 10/10/2022   Dist RCA lesion is 99% stenosed, functionally a chronic total occlusion.  A drug-eluting stent was successfully placed using a SYNERGY XD 2.50X24, postdilated to 2.9 mm and optimized with intravascular ultrasound.   Post intervention, there is a 0% residual stenosis.   Mid RCA lesion is 75% stenosed.  A drug-eluting stent was successfully placed using a SYNERGY XD 3.0X16, postdilated to 3.5 mm and optimized with intravascular ultrasound.   Post intervention, there is a 0% residual stenosis.   Prox RCA lesion is 75% stenosed.  Severely calcified.  After rotational atherectomy, A drug-eluting stent was successfully placed using a STENT MEGATRON 3.5X28, postdilated to > 3.5 mm and optimized with intravascular ultrasound.   Prox RCA to Mid RCA lesion is 25% stenosed with 25% stenosed side branch in RV Branch.   Post intervention, there is a  0% residual stenosis.   The left ventricular systolic function is normal.   LV end diastolic pressure is normal.   The left ventricular ejection fraction is 55-65% by visual estimate.   There is no aortic valve stenosis.   In the absence of any other complications or medical issues, we expect the patient to be  ready for discharge from an interventional cardiology perspective on 10/11/2022.   Recommend uninterrupted dual antiplatelet therapy with Aspirin 81mg  daily and Ticagrelor 90mg  twice daily for given diffuse disease, would plan longer P2Y12 therapy.   Successful CTO PCI of the RCA with stents placed in the distal, mid and proximal RCA.  The distal and mid stents overlap.   The patient will be watched overnight.  Results conveyed to his wife.   He continues to have a right foot wound with planned vascular surgery in a few weeks.  Will have wound care evaluate the patient in the hospital.   Would plan for prolonged more intense antiplatelet therapy given his diffuse CAD and multiple stents.  Patient Profile     77 y.o. male with PMH of CHB, CRT-D, CAD, HLD, ICM, IPF, CKD stage III and PAD. Recent cath obtained on 09/18/2022 showed 80% pro and mid LAD treated with Synergy XD 3.5 x 12. CTO of distal RCA. Now presented for CTO of RCA  Assessment & Plan    CAD - cath 09/18/2022 80% pro and mid LAD treated with Synergy XD 3.5 x 12. CTO of distal RCA. - CTO PCI of RCA 10/10/2022 DES x 3 to prox, mid and distal RCA.  - continue ASA and Brilinta.   Hypertension: will stop Imdur as previously suggested during recent office visit after CTO PCI.   Hyperlipidemia: according to patient, during recent ED visit, he was instructed to stop crestor as a potential culprit for rash, he still think previous plavix was the main culprit. He does have muscle ache on crestor. H/o intolerance of lipitor. Does tolerate simvastatin, but switched to crestor due to interaction? I offered to switch him back to  simvastatin, he wish to hold off at this time and discuss on follow up.   ICM/HFrEF: did not tolerate spironolactone, entresto, or Jardiance  CHB s/p CRT-D: paced rhythm on tele  PAD: pending vascular procedure near the end of this month. Dr. Eldridge Dace mentioned wound care consult for R foot yesterday, not sure if order was placed, but nurse did change out the dressing over the right foot this morning.   IPF: followed by Dr. Marchelle Gearing  For questions or updates, please contact [facility] Please consult [email or web address] for contact info under        Signed, Azalee Course, PA  10/11/2022, 7:50 AM     Patient seen and examined. Agree with assessment and plan.  Recurrent chest pain following complex PCI to RCA with 3 DES stents inserted in proximal and distal RCA.  Blood pressure is currently 150 systolic.  No JVD.  Lungs clear.  Rhythm regular with no ectopy with rate 62 paced rhythm.  Regularly 1% systolic murmur.  Left femoral cath site stable.  Will continue isosorbide 30 mg and not discontinue presently.  Continue amlodipine, carvedilol, and irbesartan as prescribed.  He is to undergo peripheral vascular procedure to right common femoral vein endarterectomy with femoropopliteal bypass with Dr. Randie Heinz on October 29, 2022.  Patient is cardiovascularly stable for discharge today.   Lennette Bihari, MD, [facility] 10/11/2022 11:40 AM

## 2022-10-11 NOTE — Discharge Instructions (Addendum)
Please hold Metformin for 48 hours after cath, you can restart metformin on 10/13/2022   Information about your medication: Brilinta (anti-platelet agent)  Generic Name (Brand): ticagrelor (Brilinta), twice daily medication  PURPOSE: You are taking this medication along with aspirin to lower your chance of having a heart attack, stroke, or blood clots in your heart stent. These can be fatal. Brilinta and aspirin help prevent platelets from sticking together and forming a clot that can block an artery or your stent.   Common SIDE EFFECTS you may experience include: bruising or bleeding more easily, shortness of breath  Do not stop taking BRILINTA without talking to the doctor who prescribes it for you. People who are treated with a stent and stop taking Brilinta too soon, have a higher risk of getting a blood clot in the stent, having a heart attack, or dying. If you stop Brilinta because of bleeding, or for other reasons, your risk of a heart attack or stroke may increase.   Avoid taking NSAID agents or anti-inflammatory medications such as ibuprofen, naproxen given increased bleed risk with plavix - can use acetaminophen (Tylenol) if needed for pain.  Tell all of your doctors and dentists that you are taking Brilinta. They should talk to the doctor who prescribed Brilinta for you before you have any surgery or invasive procedure.   Contact your health care provider if you experience: severe or uncontrollable bleeding, pink/red/brown urine, vomiting blood or vomit that looks like "coffee grounds", red or black stools (looks like tar), coughing up blood or blood clots ----------------------------------------------------------------------------------------------------------------------

## 2022-10-11 NOTE — Progress Notes (Signed)
CARDIAC REHAB PHASE I   PRE:  Rate/Rhythm: 77 paced   BP:  Sitting: 135/70      SaO2: 94 RA   MODE:  Ambulation: 150 ft   POST:  Rate/Rhythm: 85 paced  BP:  Sitting: 159/69      SaO2: 96 RA  Pt ambulated independently in hall. Tolerated well with no Cp, dizziness or SOB. Returned to bed with call bell and bedside table in reach. Post stent education including site care, restrictions, risk factors, exercise guidelines, NTG use, antiplatelet therapy importance, heart healthy diabetic diet and CRP2 reviewed. All questions and concerns addressed. Will refer to [facility] for CRP2. Plan for home later today.   [id number]  Woodroe Chen, RN BSN 10/11/2022 9:16 AM

## 2022-10-11 NOTE — Discharge Summary (Addendum)
Discharge Summary    Patient ID: Luke [name] MRN: [id number]; DOB: 03/17/1946  Admit date: 10/10/2022 Discharge date: 10/11/2022  PCP:  [name], MD   [facility] Providers Cardiologist:  Donato Schultz, MD        Discharge Diagnoses    Principal Problem:   Angina pectoris [facility]) Active Problems:   Type 2 diabetes mellitus with vascular disease (HCC)   Essential hypertension, benign   Ischemic cardiomyopathy   LBBB (left bundle branch block)   HLD (hyperlipidemia)   Biventricular ICD (implantable cardioverter-defibrillator) in place   IPF (idiopathic pulmonary fibrosis) (HCC)   CAD (coronary artery disease)   CKD (chronic kidney disease) stage 3, GFR 30-59 ml/min [facility])    Diagnostic Studies/Procedures     Cath 10/10/2022   Dist RCA lesion is 99% stenosed, functionally a chronic total occlusion.  A drug-eluting stent was successfully placed using a SYNERGY XD 2.50X24, postdilated to 2.9 mm and optimized with intravascular ultrasound.   Post intervention, there is a 0% residual stenosis.   Mid RCA lesion is 75% stenosed.  A drug-eluting stent was successfully placed using a SYNERGY XD 3.0X16, postdilated to 3.5 mm and optimized with intravascular ultrasound.   Post intervention, there is a 0% residual stenosis.   Prox RCA lesion is 75% stenosed.  Severely calcified.  After rotational atherectomy, A drug-eluting stent was successfully placed using a STENT MEGATRON 3.5X28, postdilated to > 3.5 mm and optimized with intravascular ultrasound.   Prox RCA to Mid RCA lesion is 25% stenosed with 25% stenosed side branch in RV Branch.   Post intervention, there is a 0% residual stenosis.   The left ventricular systolic function is normal.   LV end diastolic pressure is normal.   The left ventricular ejection fraction is 55-65% by visual estimate.   There is no aortic valve stenosis.   In the absence of any other complications or medical issues, we expect the patient  to be ready for discharge from an interventional cardiology perspective on 10/11/2022.   Recommend uninterrupted dual antiplatelet therapy with Aspirin 81mg  daily and Ticagrelor 90mg  twice daily for given diffuse disease, would plan longer P2Y12 therapy.   Successful CTO PCI of the RCA with stents placed in the distal, mid and proximal RCA.  The distal and mid stents overlap.   The patient will be watched overnight.  Results conveyed to his wife.   He continues to have a right foot wound with planned vascular surgery in a few weeks.  Will have wound care evaluate the patient in the hospital.   Would plan for prolonged more intense antiplatelet therapy given his diffuse CAD and multiple stents.  _____________   History of Present Illness     Luke [name] is a 77 y.o. male with CHB, CAD (09/18/22 DES-LAD, known CTO of RCA with L-R collateral), HLD, ICM, IPF, ICM (prior LVEF 45-50%, LVEF by [facility] 09/2022 55-65%), CRT-ICD, [id number], PAD last seen for [facility] 09/18/22.   He saw Dr. Anne Fu 09/11/22 for anginal symptoms in anticipation of RLE revascularization. Prior LHC 2013 and 2023 resulted in medical mgmt of distal RCA CTO with collaterals. However, with new anginal symptoms on 09/18/22 underwent orbital atherectomy of LAD. Plan for RCA CTO intervention upcoming.    Presents today for follow up with his wife. Pleasant gentleman who is a [place] vet. Reviewed LHC and plan for CTO intervention 10/10/22. Exercises regularly on treadmill and stationary bike. Right radial site healing well with only dime-sized  bruise. Reports exercise tolerance, breathing, energy level are much improved since procedure. Home BP at goal <130/80.   Hospital Course     Consultants: N/A   Patient presented for CTO PCI of RCA on 10/10/2022.  Cardiac catheterization revealed 99% distal RCA treated with Synergy XD 2.5 x 24 mm DES, 75% mid RCA treated with Synergy XD 3.0 x 16 mm DES, 75% proximal RCA treated with Megatron 3.5 x 28 mm DES.   EF 55 to 65%.  Postprocedure, patient was placed on aspirin and Brilinta.  He was seen in the morning of 10/11/2022 at which time he was doing well without chest pain or worsening dyspnea.  He ambulated with cardiac rehab without issue.  Emphasis has been placed on compliance with dual antiplatelet therapy.  Dr. Tresa Endo reviewed his case and recommended to continue on Imdur 30 mg daily.  He did have a question regarding Crestor, he says he has some muscle ache on the Crestor however was able to previously tolerated simvastatin.  He wished to hold off on adjusting to statin for now and try the Crestor low bit longer.  He does have upcoming PV procedure scheduled with Dr. Randie Heinz on 10/29/2022.  He has been instructed to hold metformin for 48 hours after cath.      Did the patient have an acute coronary syndrome (MI, NSTEMI, STEMI, etc) this admission?:  No                               Did the patient have a percutaneous coronary intervention (stent / angioplasty)?:  Yes.     Cath/PCI Registry Performance & Quality Measures: Aspirin prescribed? - Yes ADP Receptor Inhibitor (Plavix/Clopidogrel, Brilinta/Ticagrelor or Effient/Prasugrel) prescribed (includes medically managed patients)? - Yes High Intensity Statin (Lipitor 40-80mg  or Crestor 20-40mg ) prescribed? - Yes For EF <40%, was ACEI/ARB prescribed? - Not Applicable (EF >/= 40%) For EF <40%, Aldosterone Antagonist (Spironolactone or Eplerenone) prescribed? - Not Applicable (EF >/= 40%) Cardiac Rehab Phase II ordered? - Yes         _____________  Discharge Vitals Blood pressure 135/70, pulse 77, temperature 98 F (36.7 C), temperature source Oral, resp. rate 17, height 6\' 2"  (1.88 m), weight 82.1 kg, SpO2 94 %.  Filed Weights   10/10/22 0601  Weight: 82.1 kg    Labs & Radiologic Studies    CBC Recent Labs    10/11/22 0250  WBC 11.8*  HGB 13.5  HCT 39.8  MCV 93.4  PLT 270   Basic Metabolic Panel Recent Labs    16/10/96 0250  NA  138  K 3.9  CL 104  CO2 23  GLUCOSE 137*  BUN 25*  CREATININE 1.15  CALCIUM 9.3   Liver Function Tests No results for input(s): "AST", "ALT", "ALKPHOS", "BILITOT", "PROT", "ALBUMIN" in the last 72 hours. No results for input(s): "LIPASE", "AMYLASE" in the last 72 hours. High Sensitivity Troponin:   No results for input(s): "TROPONINIHS" in the last 720 hours.  BNP Invalid input(s): "POCBNP" D-Dimer No results for input(s): "DDIMER" in the last 72 hours. Hemoglobin A1C No results for input(s): "HGBA1C" in the last 72 hours. Fasting Lipid Panel No results for input(s): "CHOL", "HDL", "LDLCALC", "TRIG", "CHOLHDL", "LDLDIRECT" in the last 72 hours. Thyroid Function Tests No results for input(s): "TSH", "T4TOTAL", "T3FREE", "THYROIDAB" in the last 72 hours.  Invalid input(s): "FREET3" _____________  CARDIAC CATHETERIZATION  Result Date: 10/10/2022   Dist RCA lesion  is 99% stenosed, functionally a chronic total occlusion.  A drug-eluting stent was successfully placed using a SYNERGY XD 2.50X24, postdilated to 2.9 mm and optimized with intravascular ultrasound.   Post intervention, there is a 0% residual stenosis.   Mid RCA lesion is 75% stenosed.  A drug-eluting stent was successfully placed using a SYNERGY XD 3.0X16, postdilated to 3.5 mm and optimized with intravascular ultrasound.   Post intervention, there is a 0% residual stenosis.   Prox RCA lesion is 75% stenosed.  Severely calcified.  After rotational atherectomy, A drug-eluting stent was successfully placed using a STENT MEGATRON 3.5X28, postdilated to > 3.5 mm and optimized with intravascular ultrasound.   Prox RCA to Mid RCA lesion is 25% stenosed with 25% stenosed side branch in RV Branch.   Post intervention, there is a 0% residual stenosis.   The left ventricular systolic function is normal.   LV end diastolic pressure is normal.   The left ventricular ejection fraction is 55-65% by visual estimate.   There is no aortic valve  stenosis.   In the absence of any other complications or medical issues, we expect the patient to be ready for discharge from an interventional cardiology perspective on 10/11/2022.   Recommend uninterrupted dual antiplatelet therapy with Aspirin 81mg  daily and Ticagrelor 90mg  twice daily for given diffuse disease, would plan longer P2Y12 therapy. Successful CTO PCI of the RCA with stents placed in the distal, mid and proximal RCA.  The distal and mid stents overlap. The patient will be watched overnight.  Results conveyed to his wife. He continues to have a right foot wound with planned vascular surgery in a few weeks.  Will have wound care evaluate the patient in the hospital. Would plan for prolonged more intense antiplatelet therapy given his diffuse CAD and multiple stents.   US Venous Img Lower Unilateral Right  Result Date: 10/05/2022 CLINICAL DATA:  Edema and color changes. EXAM: RIGHT LOWER EXTREMITY VENOUS DOPPLER ULTRASOUND TECHNIQUE: Gray-scale sonography with compression, as well as color and duplex ultrasound, were performed to evaluate the deep venous system(s) from the level of the common femoral vein through the popliteal and proximal calf veins. COMPARISON:  None Available. FINDINGS: VENOUS Normal compressibility of the common femoral, superficial femoral, and popliteal veins, as well as the visualized calf veins. Visualized portions of profunda femoral vein and great saphenous vein unremarkable. No filling defects to suggest DVT on grayscale or color Doppler imaging. Doppler waveforms show normal direction of venous flow, normal respiratory plasticity and response to augmentation. Limited views of the contralateral common femoral vein are unremarkable. OTHER None. Limitations: none IMPRESSION: Negative. Electronically Signed   By: Gerome Sam III M.D.   On: 10/05/2022 15:15   CARDIAC CATHETERIZATION  Result Date: 09/18/2022   Mid RCA to Dist RCA lesion is 50% stenosed.   Prox RCA lesion  is 75% stenosed.   Mid RCA lesion is 25% stenosed.   Dist RCA lesion is 99% stenosed.   Prox LAD to Mid LAD lesion is 80% stenosed.   After IVL with 3.5 x 12mm Shockwave,  drug-eluting stent was successfully placed using a SYNERGY XD 3.50X12, postdilated to > 3.75 mm and optimized with IVUS.   Post intervention, there is a 0% residual stenosis.   The left ventricular systolic function is normal.   LV end diastolic pressure is normal.   The left ventricular ejection fraction is 55-65% by visual estimate.   There is no aortic valve stenosis. Successful IVL followed by  stent placement of LAD.  CTO of distal RCA, now with a channel which may facilitate CTO PCI. Plan same day discharge.   Disposition   Pt is being discharged home today in good condition.  Follow-up Plans & Appointments     Follow-up Information     Alver Sorrow, NP Follow up on 10/22/2022.   Specialty: Cardiology Why: 10:05AM. Cardiology follow up Contact information: [address] [place] [address] [phone]                Discharge Instructions     Amb Referral to Cardiac Rehabilitation   Complete by: As directed    Diagnosis: Coronary Stents   After initial evaluation and assessments completed: Virtual Based Care may be provided alone or in conjunction with Phase 2 Cardiac Rehab based on patient barriers.: Yes   Intensive Cardiac Rehabilitation (ICR) [facility] location only OR Traditional Cardiac Rehabilitation (TCR) *If criteria for ICR are not met will enroll in TCR [facility] only): Yes        Discharge Medications   Allergies as of 10/11/2022       Reactions   Aldactone [spironolactone] Other (See Comments)   Hyperkalemia   Codeine Rash, Hives   Atorvastatin    Myalgias with lipitor.  Does tolerate simvastatin.     Januvia [sitagliptin] Other (See Comments)   Diarrhea and heart racing   Jardiance [empagliflozin] Other (See Comments)   Polyuria; excessive weight loss   Lisinopril    Possible  cause of pancreatitis   Plavix [clopidogrel] Rash        Medication List     TAKE these medications    Accu-Chek Aviva Plus test strip Generic drug: glucose blood AS DIRECTED TO CHECK BLOOD SUGAR TWICE DAILY   albuterol 108 (90 Base) MCG/ACT inhaler Commonly known as: VENTOLIN HFA Inhale 2 puffs into the lungs every 6 (six) hours as needed for wheezing or shortness of breath.   amLODipine 10 MG tablet Commonly known as: NORVASC TAKE 1 TABLET(10 MG) BY MOUTH DAILY What changed: See the new instructions.   aspirin EC 81 MG tablet Take 1 tablet (81 mg total) by mouth daily. What changed: when to take this   carvedilol 25 MG tablet Commonly known as: COREG TAKE 1 TABLET BY MOUTH TWICE DAILY WITH MEALS   Esbriet 267 MG Tabs Generic drug: Pirfenidone Take 2 tablets (534 mg total) by mouth in the morning, at noon, and at bedtime. **NOTE LOW DOSE AS MAINTENANCE**   famotidine 20 MG tablet Commonly known as: PEPCID Take 1 tablet (20 mg total) by mouth at bedtime.   glipiZIDE 5 MG tablet Commonly known as: GLUCOTROL TAKE 1 TABLET(5 MG) BY MOUTH TWICE DAILY BEFORE A MEAL What changed: See the new instructions.   hydrOXYzine 25 MG tablet Commonly known as: ATARAX Take 1 tablet (25 mg total) by mouth every 6 (six) hours as needed for itching.   Incruse Ellipta 62.5 MCG/ACT Aepb Generic drug: umeclidinium bromide Inhale 1 puff into the lungs daily. What changed: when to take this   isosorbide mononitrate 30 MG 24 hr tablet Commonly known as: IMDUR Take 1 tablet (30 mg total) by mouth daily. What changed: when to take this   Lantus SoloStar 100 UNIT/ML Solostar Pen Generic drug: insulin glargine INJECT 0.3 TO 0.35 MLS(30 TO 35 UNITS) INTO THE SKIN EVERY DAY What changed:  how much to take how to take this when to take this additional instructions   metFORMIN 500 MG tablet Commonly  known as: GLUCOPHAGE TAKE 2 TABLETS BY MOUTH TWICE DAILY WITH FOOD    multivitamin with minerals Tabs tablet Take 1 tablet by mouth in the morning.   pantoprazole 40 MG tablet Commonly known as: Protonix Take 2 tablets (80 mg total) by mouth daily. What changed: when to take this   rosuvastatin 20 MG tablet Commonly known as: Crestor Take 1 tablet (20 mg total) by mouth daily.   ticagrelor 90 MG Tabs tablet Commonly known as: Brilinta Take 1 tablet (90 mg total) by mouth 2 (two) times daily.   valsartan 40 MG tablet Commonly known as: DIOVAN Take 20 mg by mouth daily with supper.   VITAMIN B-12 PO Take 2,000 mcg by mouth in the morning.   Vitamin D3 50 MCG (2000 UT) Tabs Take 2,000 Units by mouth in the morning.           Outstanding Labs/Studies   N/A  Duration of Discharge Encounter   Greater than 30 minutes including physician time.  Ramond Dial, PA 10/11/2022, 12:42 PM

## 2022-10-11 NOTE — Telephone Encounter (Signed)
Hello,  GILMAN OLAZABAL is a 77 y.o. male (MRN: [id number], DOB: Jun 01, 1945) who was recently hospitalized. They are anticipated to visit your clinic for post-discharge follow-up and may benefit from assistance with medication initiation and/or access.     Relevant medication access issues which may benefit from further intervention include: -Brilinta copay comes back at $166.96 copay/month. Patient endorses he has samples and the 30 day free initial copay card to bridge him right now. Based on his income, he should qualify for patient assistance. He has his portion of the patient assistance paperwork filled out and was instructed to bring to his follow-up appointment to be completed. Please complete and submit if possible at his appointment.   We appreciate your assistance with the implementation of these recommendations. Please let us know if there is anything we can help you with at this time.       Thank you for allowing pharmacy to participate in this patient's care,  Sherron Monday, PharmD, BCCCP Clinical Pharmacist  Phone: [phone] 10/11/2022 2:52 PM  Please check AMION for all [facility] Pharmacy phone numbers After 10:00 PM, call Main Pharmacy [phone]

## 2022-10-15 LAB — LIPOPROTEIN A (LPA): Lipoprotein (a): 38.3 nmol/L — ABNORMAL HIGH (ref <75.0)

## 2022-10-16 ENCOUNTER — Telehealth (HOSPITAL_COMMUNITY): Payer: Self-pay

## 2022-10-16 NOTE — Telephone Encounter (Signed)
Attempted to call patient in regards to Cardiac Rehab - LM on VM 

## 2022-10-16 NOTE — Telephone Encounter (Signed)
Pt insurance is active and benefits verified through Medicare A/B. Co-pay $0.00, DED $240.00/$240.00 met, out of pocket $0.00/$0.00 met, co-insurance 20%. No pre-authorization required. Passport, 10/16/22 @ 3:03PM, REF#[id number]   How many CR sessions are covered? (36 visits for TCR, 72 visits for ICR)72 Is this a lifetime maximum or an annual maximum? Annual Has the member used any of these services to date? No Is there a time limit (weeks/months) on start of program and/or program completion? No   2ndary insurance is active and benefits verified through [employer]. Co-pay $0.00, DED $0.00/$0.00 met, out of pocket $0.00/$0.00 met, co-insurance 0%. No pre-authorization required. Passport, 10/16/22 @ 3:06PM, REF#[id number]    Will contact patient to see if he is interested in the Cardiac Rehab Program. If interested, patient will need to complete follow up appt. Once completed, patient will be contacted for scheduling upon review by the RN Navigator.

## 2022-10-17 ENCOUNTER — Telehealth: Payer: Self-pay | Admitting: Family Medicine

## 2022-10-17 ENCOUNTER — Encounter: Payer: Self-pay | Admitting: Family Medicine

## 2022-10-17 ENCOUNTER — Encounter (HOSPITAL_BASED_OUTPATIENT_CLINIC_OR_DEPARTMENT_OTHER): Payer: Self-pay

## 2022-10-17 ENCOUNTER — Ambulatory Visit (INDEPENDENT_AMBULATORY_CARE_PROVIDER_SITE_OTHER): Payer: Medicare Other | Admitting: Family Medicine

## 2022-10-17 VITALS — BP 124/80 | HR 85 | Temp 97.8°F | Ht 74.0 in | Wt 185.0 lb

## 2022-10-17 DIAGNOSIS — L03119 Cellulitis of unspecified part of limb: Secondary | ICD-10-CM

## 2022-10-17 DIAGNOSIS — I2510 Atherosclerotic heart disease of native coronary artery without angina pectoris: Secondary | ICD-10-CM

## 2022-10-17 MED ORDER — AMOXICILLIN-POT CLAVULANATE 875-125 MG PO TABS: 1.0000 | ORAL_TABLET | Freq: Two times a day (BID) | ORAL | 0 refills | Status: DC

## 2022-10-17 MED ORDER — CLOPIDOGREL BISULFATE 75 MG PO TABS
75.0000 mg | ORAL_TABLET | Freq: Every day | ORAL | Status: DC
Start: 2022-10-17 — End: 2022-10-18

## 2022-10-17 MED ORDER — GLIPIZIDE 5 MG PO TABS: 5.0000 mg | ORAL_TABLET | Freq: Two times a day (BID) | ORAL | Status: DC

## 2022-10-17 NOTE — Telephone Encounter (Signed)
Please call pt.  I talked with cardiology.  Hold crestor, stop brilinta.  Restart plavix tonight.  Please let me know how he does with that or if he needs new rx for plavix.  I thank all involved.

## 2022-10-17 NOTE — Telephone Encounter (Signed)
Patient has been notified of instructions and verbalized understanding.

## 2022-10-17 NOTE — Progress Notes (Signed)
Discussed with patient about recent events:   Patient presented for CTO PCI of RCA on 10/10/2022.  Cardiac catheterization revealed 99% distal RCA treated with Synergy XD 2.5 x 24 mm DES, 75% mid RCA treated with Synergy XD 3.0 x 16 mm DES, 75% proximal RCA treated with Megatron 3.5 x 28 mm DES.  EF 55 to 65%.  Postprocedure, patient was placed on aspirin and Brilinta.  He was seen in the morning of 10/11/2022 at which time he was doing well without chest pain or worsening dyspnea.  He ambulated with cardiac rehab without issue.  Emphasis has been placed on compliance with dual antiplatelet therapy.  Dr. Tresa Endo reviewed his case and recommended to continue on Imdur 30 mg daily.  He did have a question regarding Crestor, he says he has some muscle ache on the Crestor however was able to previously tolerated simvastatin.  He wished to hold off on adjusting to statin for now and try the Crestor low bit longer.  He does have upcoming PV procedure scheduled with Dr. Randie Heinz on 10/29/2022.  He has been instructed to hold metformin for 48 hours after cath.  ==================== He had a rash after a med change.  Seen at ER 10/07/22.  He took benadryl for the rash, hives.  He had steroid shot and was taken off plavix and changed to brilinta.  Still on aspirin.  He was still on crestor until 4 days ago- he thought that was the issue with the rash/itching.  Diffuse trunk and legs rash, bilateral.  Not on the plans.  The itching is clearly better in the meantime.  Rash predates brilinta.    He was able to tolerate simvastatin in the past.  Discussed.  Not on espriet currently.  Has been off for about weeks.    He noted his breathing was worse on brilinta.  He had more cramping in the hands with brilinta start.   He has vascular surgery pending.    Was prev on cipro.  Off in the meantime. Redness prev improved on cipro.  No fevers.    PMH and SH reviewed  Meds, vitals, and allergies reviewed.   ROS: Per HPI unless  specifically indicated in ROS section   GEN: nad, alert and oriented HEENT: ncat NECK: supple w/o LA CV: rrr. PULM: ctab, no inc wob ABD: soft, +bs EXT: no edema SKIN: Diffuse rash on the trunk and legs but R>L foot redness. Noted  metatarsal callous on the R 1st metatarsal.   No open ulcer but skin on the right foot is diffusely shiny.  Faint B DP pulses.    30 minutes were devoted to patient care in this encounter (this includes time spent reviewing the patient's file/history, interviewing and examining the patient, counseling/reviewing plan with patient).

## 2022-10-17 NOTE — Patient Instructions (Signed)
Hold crestor and brilinta today.  I am asking for cardiology input.  Start augmentin for your foot.   Take care.  Glad to see you.

## 2022-10-18 MED ORDER — CLOPIDOGREL BISULFATE 75 MG PO TABS: 75.0000 mg | ORAL_TABLET | Freq: Every day | ORAL | 3 refills | Status: DC

## 2022-10-18 NOTE — Telephone Encounter (Signed)
Called patient let know information. Will take as directed.  Will need new script sent in. Will send my chart message in a few days about foot.

## 2022-10-18 NOTE — Addendum Note (Signed)
Addended by: [name] on: 10/18/2022 02:02 PM   Modules accepted: Orders

## 2022-10-18 NOTE — Telephone Encounter (Signed)
Please update patient.  I got a follow up note from cardiology.  Would take extra plavix today- would take 4 pills today to reload that med.  Then go back to taking 1 tab a day starting tomorrow.  Please have him update me about his foot in a few days, after being on the abx. Thanks.

## 2022-10-18 NOTE — Telephone Encounter (Signed)
I sent the plavix rx.  Thanks.

## 2022-10-20 DIAGNOSIS — L0390 Cellulitis, unspecified: Secondary | ICD-10-CM | POA: Insufficient documentation

## 2022-10-20 NOTE — Assessment & Plan Note (Signed)
Discussed options.  Hold crestor and brilinta today.  I am asking for cardiology input.  See following phone note. The concern is that he is not tolerating Brilinta due to shortness of breath and cramping.  The concern is Crestor is causing his rash.  Noted that he did tolerate simvastatin in the past, but he is currently treated with amlodipine.

## 2022-10-20 NOTE — Assessment & Plan Note (Signed)
Would start Augmentin and have him update me if not improving.  He has vascular follow-up pending.  At this point, still okay for outpatient follow-up.

## 2022-10-22 ENCOUNTER — Ambulatory Visit (INDEPENDENT_AMBULATORY_CARE_PROVIDER_SITE_OTHER): Payer: Medicare Other | Admitting: Family

## 2022-10-22 ENCOUNTER — Encounter (HOSPITAL_BASED_OUTPATIENT_CLINIC_OR_DEPARTMENT_OTHER): Payer: Self-pay

## 2022-10-22 ENCOUNTER — Encounter (HOSPITAL_BASED_OUTPATIENT_CLINIC_OR_DEPARTMENT_OTHER): Payer: Self-pay | Admitting: Family

## 2022-10-22 VITALS — BP 122/68 | HR 68 | Ht 74.0 in | Wt 188.0 lb

## 2022-10-22 DIAGNOSIS — I25118 Atherosclerotic heart disease of native coronary artery with other forms of angina pectoris: Secondary | ICD-10-CM | POA: Diagnosis not present

## 2022-10-22 DIAGNOSIS — Z95810 Presence of automatic (implantable) cardiac defibrillator: Secondary | ICD-10-CM | POA: Diagnosis not present

## 2022-10-22 DIAGNOSIS — I447 Left bundle-branch block, unspecified: Secondary | ICD-10-CM | POA: Diagnosis not present

## 2022-10-22 DIAGNOSIS — R21 Rash and other nonspecific skin eruption: Secondary | ICD-10-CM | POA: Diagnosis not present

## 2022-10-22 DIAGNOSIS — Z955 Presence of coronary angioplasty implant and graft: Secondary | ICD-10-CM

## 2022-10-22 DIAGNOSIS — E785 Hyperlipidemia, unspecified: Secondary | ICD-10-CM

## 2022-10-22 DIAGNOSIS — I739 Peripheral vascular disease, unspecified: Secondary | ICD-10-CM

## 2022-10-22 DIAGNOSIS — I255 Ischemic cardiomyopathy: Secondary | ICD-10-CM | POA: Diagnosis not present

## 2022-10-22 MED ORDER — SIMVASTATIN 20 MG PO TABS
20.0000 mg | ORAL_TABLET | Freq: Every day | ORAL | 3 refills | Status: DC
Start: 2022-10-22 — End: 2023-10-13

## 2022-10-22 MED ORDER — PRASUGREL HCL 10 MG PO TABS
10.0000 mg | ORAL_TABLET | Freq: Every day | ORAL | 5 refills | Status: DC
Start: 2022-10-22 — End: 2022-10-23

## 2022-10-22 MED ORDER — CETIRIZINE HCL 10 MG PO TABS
10.0000 mg | ORAL_TABLET | Freq: Every day | ORAL | 0 refills | Status: DC
Start: 2022-10-22 — End: 2022-11-27

## 2022-10-22 NOTE — Patient Instructions (Addendum)
Medication Instructions:  REMAIN OFF BRLINITA AND CLOPIDOGREL   START EFFIENT 10 MG DAILY   STOP ROSUVASTATIN   START SIMVASTATIN 20 MG DAILY   START ZYRTEC FOR 3-5 DAYS   *If you need a refill on your cardiac medications before your next appointment, please call your pharmacy*  Lab Work: FASTING LP/HFP END OF AUGUST YOU CAN GO TO ANY [facility]   If you have labs (blood work) drawn today and your tests are completely normal, you will receive your results only by: MyChart Message (if you have MyChart) OR A paper copy in the mail If you have any lab test that is abnormal or we need to change your treatment, we will call you to review the results.  Testing/Procedures: NONE   Follow-Up: KEEP FOLLOW UP WITH DR Graciela Husbands AND DR Anne Fu AS SCHEDULED

## 2022-10-22 NOTE — Pre-Procedure Instructions (Signed)
Surgical Instructions    Your procedure is scheduled on Tuesday, June 25th.  Report to [facility] Main Entrance "A" at 5:30 A.M., then check in with the Admitting office.  Call this number if you have problems the morning of surgery:  [phone]  If you have any questions prior to your surgery date call [phone]: Open Monday-Friday 8am-4pm If you experience any cold or flu symptoms such as cough, fever, chills, shortness of breath, etc. between now and your scheduled surgery, please notify us at the above number.     Remember:  Do not eat or drink after midnight the night before your surgery   Take these medicines the morning of surgery with A SIP OF WATER  amLODipine (NORVASC)  aspirin EC  carvedilol (COREG)  cetirizine (ZYRTEC)  ESBRIET  isosorbide mononitrate (IMDUR)  pantoprazole (PROTONIX)  umeclidinium bromide (INCRUSE ELLIPTA)  albuterol (VENTOLIN HFA) 108 (90 Base) MCG/ACT inhaler -if needed.  As of today, STOP taking any Aleve, Naproxen, Ibuprofen, Motrin, Advil, Goody's, BC's, all herbal medications, fish oil, and all vitamins.   WHAT DO I DO ABOUT MY DIABETES MEDICATION?   Do not take metFORMIN (GLUCOPHAGE) or glipiZIDE (GLUCOTROL) the morning of surgery.  THE NIGHT BEFORE SURGERY, take 17 units of insulin glargine (LANTUS SOLOSTAR) insulin.       THE MORNING OF SURGERY, take 17  units of insulin glargine (LANTUS SOLOSTAR) insulin.   HOW TO MANAGE YOUR DIABETES BEFORE AND AFTER SURGERY  Why is it important to control my blood sugar before and after surgery? Improving blood sugar levels before and after surgery helps healing and can limit problems. A way of improving blood sugar control is eating a healthy diet by:  Eating less sugar and carbohydrates  Increasing activity/exercise  Talking with your doctor about reaching your blood sugar goals High blood sugars (greater than 180 mg/dL) can raise your risk of infections and slow your recovery, so you  will need to focus on controlling your diabetes during the weeks before surgery. Make sure that the doctor who takes care of your diabetes knows about your planned surgery including the date and location.  How do I manage my blood sugar before surgery? Check your blood sugar at least 4 times a day, starting 2 days before surgery, to make sure that the level is not too high or low.  Check your blood sugar the morning of your surgery when you wake up and every 2 hours until you get to the Short Stay unit.  If your blood sugar is less than 70 mg/dL, you will need to treat for low blood sugar: Do not take insulin. Treat a low blood sugar (less than 70 mg/dL) with  cup of clear juice (cranberry or apple), 4 glucose tablets, OR glucose gel. Recheck blood sugar in 15 minutes after treatment (to make sure it is greater than 70 mg/dL). If your blood sugar is not greater than 70 mg/dL on recheck, call [phone] for further instructions. Report your blood sugar to the short stay nurse when you get to Short Stay.  If you are admitted to the hospital after surgery: Your blood sugar will be checked by the staff and you will probably be given insulin after surgery (instead of oral diabetes medicines) to make sure you have good blood sugar levels. The goal for blood sugar control after surgery is 80-180 mg/dL.                      Do  NOT Smoke (Tobacco/Vaping) for 24 hours prior to your procedure.  If you use a CPAP at night, you may bring your mask/headgear for your overnight stay.   Contacts, glasses, piercing's, hearing aid's, dentures or partials may not be worn into surgery, please bring cases for these belongings.    For patients admitted to the hospital, discharge time will be determined by your treatment team.   Patients discharged the day of surgery will not be allowed to drive home, and someone needs to stay with them for 24 hours.  SURGICAL WAITING ROOM VISITATION Patients having  surgery or a procedure may have no more than 2 support people in the waiting area - these visitors may rotate.   Children under the age of 23 must have an adult with them who is not the patient. If the patient needs to stay at the hospital during part of their recovery, the visitor guidelines for inpatient rooms apply. Pre-op nurse will coordinate an appropriate time for 1 support person to accompany patient in pre-op.  This support person may not rotate.   Please refer to the [facility] website for the visitor guidelines for Inpatients (after your surgery is over and you are in a regular room).    Special instructions:   [place]- Preparing For Surgery  Before surgery, you can play an important role. Because skin is not sterile, your skin needs to be as free of germs as possible. You can reduce the number of germs on your skin by washing with CHG (chlorahexidine gluconate) Soap before surgery.  CHG is an antiseptic cleaner which kills germs and bonds with the skin to continue killing germs even after washing.    Oral Hygiene is also important to reduce your risk of infection.  Remember - BRUSH YOUR TEETH THE MORNING OF SURGERY WITH YOUR REGULAR TOOTHPASTE  Please do not use if you have an allergy to CHG or antibacterial soaps. If your skin becomes reddened/irritated stop using the CHG.  Do not shave (including legs and underarms) for at least 48 hours prior to first CHG shower. It is OK to shave your face.  Please follow these instructions carefully.   Shower the NIGHT BEFORE SURGERY and the MORNING OF SURGERY  If you chose to wash your hair, wash your hair first as usual with your normal shampoo.  After you shampoo, rinse your hair and body thoroughly to remove the shampoo.  Use CHG Soap as you would any other liquid soap. You can apply CHG directly to the skin and wash gently with a scrungie or a clean washcloth.   Apply the CHG Soap to your body ONLY FROM THE NECK DOWN.  Do not use  on open wounds or open sores. Avoid contact with your eyes, ears, mouth and genitals (private parts). Wash Face and genitals (private parts)  with your normal soap.   Wash thoroughly, paying special attention to the area where your surgery will be performed.  Thoroughly rinse your body with warm water from the neck down.  DO NOT shower/wash with your normal soap after using and rinsing off the CHG Soap.  Pat yourself dry with a CLEAN TOWEL.  Wear CLEAN PAJAMAS to bed the night before surgery  Place CLEAN SHEETS on your bed the night before your surgery  DO NOT SLEEP WITH PETS.   Day of Surgery: Take a shower with CHG soap. Do not wear jewelry  Do not wear lotions, powders, colognes, or deodorant. Do not shave 48 hours prior  to surgery.  Men may shave face and neck. Do not bring valuables to the hospital.  [facility] is not responsible for any belongings or valuables. Wear Clean/Comfortable clothing the morning of surgery Remember to brush your teeth WITH YOUR REGULAR TOOTHPASTE.   Please read over the following fact sheets that you were given.    If you received a COVID test during your pre-op visit  it is requested that you wear a mask when out in public, stay away from anyone that may not be feeling well and notify your surgeon if you develop symptoms. If you have been in contact with anyone that has tested positive in the last 10 days please notify you surgeon.

## 2022-10-22 NOTE — Progress Notes (Signed)
Cardiology Office Note:  .   Date:  10/22/2022  ID:  Luke [name], DOB 16-Sep-1945, MRN [id number] PCP: [name], MD  [facility] Providers Cardiologist:  Donato Schultz, MD    History of Present Illness: .   Luke [name] is a 77 y.o. male  hx of CHB, CAD (09/18/22 DES-LAD, known CTO of RCA with L-R collateral), HLD, ICM, IPF, ICM (prior LVEF 45-50%, LVEF by [facility] 09/2022 55-65%), CRT-ICD, ZDG6Y, PAD last seen for CTO intervention 10/10/22.   He saw Dr. Anne Fu 09/11/22 for anginal symptoms in anticipation of RLE revascularization. Prior LHC 2013 and 2023 resulted in medical mgmt of distal RCA CTO with collaterals. However, with new anginal symptoms on 09/18/22 underwent orbital atherectomy of LAD. Seen in follow up 09/24/2022 with significant improvement in exertional dyspnea.   ED visit 10/05/2022 with redness, swelling to RLE with no pain.  He also noted some myalgias on Crestor was stopped.  Given dose of PO Benadryl. Recurrent ED visit 10/07/22 with generalized rash Plavix was stopped and transition to Brilinta.  On 10/10/22 he underwent PCI of RCA with stents placed in the distal, mid, proximal RCA with distal and mid stents overlapping.  Saw PCP 10/17/2022 instructed to hold Crestor, start Brilinta, resume Plavix.  Not tolerate Brilinta due to shortness of breath.  Concern Crestor was causing a rash.  Noted he previously tolerated simvastatin.  Presents today for follow up with his wife. Pleasant gentleman who is a [place] vet. Took Plavix for 3 days and had worsened rash. Took Brilinta for 2 days (did not take yesterday or this morning) with recurrent shortness of breath. Rash is flat, red, and diffuse across bilateral arms, chest, abdomen.  It is itching with no exudate.  Has not trialed antihistamine or cortisone ointment.  Does note in stopping Brilinta his breathing has been improving. Bp at home 128-129/73.  Right femoral catheterization site healing without  complication.   Previous intolerances Brilinta - shortness of breath, worsened neuropathy (not improved with caffeine) Plavix - rash Atorvastatin - myalgias Rosuvastatin - myalgias  ROS: Please see the history of present illness.    All other systems reviewed and are negative.   Studies Reviewed: .        Cardiac Studies & Procedures   CARDIAC CATHETERIZATION  CARDIAC CATHETERIZATION 10/10/2022  Narrative   Dist RCA lesion is 99% stenosed, functionally a chronic total occlusion.  A drug-eluting stent was successfully placed using a SYNERGY XD 2.50X24, postdilated to 2.9 mm and optimized with intravascular ultrasound.   Post intervention, there is a 0% residual stenosis.   Mid RCA lesion is 75% stenosed.  A drug-eluting stent was successfully placed using a SYNERGY XD 3.0X16, postdilated to 3.5 mm and optimized with intravascular ultrasound.   Post intervention, there is a 0% residual stenosis.   Prox RCA lesion is 75% stenosed.  Severely calcified.  After rotational atherectomy, A drug-eluting stent was successfully placed using a STENT MEGATRON 3.5X28, postdilated to > 3.5 mm and optimized with intravascular ultrasound.   Prox RCA to Mid RCA lesion is 25% stenosed with 25% stenosed side branch in RV Branch.   Post intervention, there is a 0% residual stenosis.   The left ventricular systolic function is normal.   LV end diastolic pressure is normal.   The left ventricular ejection fraction is 55-65% by visual estimate.   There is no aortic valve stenosis.   In the absence of any other complications or medical issues, we  expect the patient to be ready for discharge from an interventional cardiology perspective on 10/11/2022.   Recommend uninterrupted dual antiplatelet therapy with Aspirin 81mg  daily and Ticagrelor 90mg  twice daily for given diffuse disease, would plan longer P2Y12 therapy.  Successful CTO PCI of the RCA with stents placed in the distal, mid and proximal RCA.  The  distal and mid stents overlap.  The patient will be watched overnight.  Results conveyed to his wife.  He continues to have a right foot wound with planned vascular surgery in a few weeks.  Will have wound care evaluate the patient in the hospital.  Would plan for prolonged more intense antiplatelet therapy given his diffuse CAD and multiple stents.  Findings Coronary Findings Diagnostic  Dominance: Right  Right Coronary Artery Prox RCA lesion is 75% stenosed. Prox RCA to Mid RCA lesion is 25% stenosed with 25% stenosed side branch in RV Branch. Mid RCA lesion is 75% stenosed. Dist RCA lesion is 99% stenosed. The lesion is chronically occluded with left-to-right collateral flow.  Third Right Posterolateral Branch  Intervention  Prox RCA lesion Stent A drug-eluting stent was successfully placed using a STENT MEGATRON 3.5X28. Stent strut is well apposed. Post-stent angioplasty was performed using a BALLN [place] EMERGE MR 3.5X12. Post-Intervention Lesion Assessment The intervention was successful. Pre-interventional TIMI flow is 3. Post-intervention TIMI flow is 3. No complications occurred at this lesion. Ultrasound (IVUS) was performed on the lesion post PCI using a CATH OPTICROSS HD. There is a 0% residual stenosis post intervention.  Mid RCA lesion Stent CATH MACH1 54F AL1 90CM guide catheter was inserted. Lesion crossed with guidewire using a WIRE ASAHI PROWATER 180CM. Pre-stent angioplasty was not performed. A drug-eluting stent was successfully placed using a SYNERGY XD 3.0X16. Stent strut is well apposed. Post-stent angioplasty was performed using a BALLN [place] EMERGE MR 3.5X12. Post-Intervention Lesion Assessment The intervention was successful. Pre-interventional TIMI flow is 3. Post-intervention TIMI flow is 3. No complications occurred at this lesion. Ultrasound (IVUS) was performed on the lesion post PCI using a CATH OPTICROSS HD. Stent well apposed. There is a 0% residual  stenosis post intervention.  Dist RCA lesion Stent CATH MACH1 54F AL1 90CM guide catheter was inserted. Lesion crossed with guidewire using a WIRE ASAHI PROWATER 180CM. Pre-stent angioplasty was performed using a BALLN EMERGE MR 2.0X12. A drug-eluting stent was successfully placed using a SYNERGY XD 2.50X24. Stent strut is well apposed. Post-stent angioplasty was performed. Postdilated with the 3.0 mm stent balloon Post-Intervention Lesion Assessment The intervention was successful. Pre-interventional TIMI flow is 1. Post-intervention TIMI flow is 3. No complications occurred at this lesion. Ultrasound (IVUS) was performed on the lesion post PCI using a CATH OPTICROSS HD. Stent well apposed. There is a 0% residual stenosis post intervention.   CARDIAC CATHETERIZATION  CARDIAC CATHETERIZATION 09/18/2022  Narrative   Mid RCA to Dist RCA lesion is 50% stenosed.   Prox RCA lesion is 75% stenosed.   Mid RCA lesion is 25% stenosed.   Dist RCA lesion is 99% stenosed.   Prox LAD to Mid LAD lesion is 80% stenosed.   After IVL with 3.5 x 12mm Shockwave,  drug-eluting stent was successfully placed using a SYNERGY XD 3.50X12, postdilated to > 3.75 mm and optimized with IVUS.   Post intervention, there is a 0% residual stenosis.   The left ventricular systolic function is normal.   LV end diastolic pressure is normal.   The left ventricular ejection fraction is 55-65% by visual estimate.  There is no aortic valve stenosis.  Successful IVL followed by stent placement of LAD. CTO of distal RCA, now with a channel which may facilitate CTO PCI. Plan same day discharge.  Findings Coronary Findings Diagnostic  Dominance: Right  Left Anterior Descending Prox LAD to Mid LAD lesion is 80% stenosed. The lesion is severely calcified. Ultrasound (IVUS) was performed. Cross-sectional area: 3.07 mm.  Severe plaque burden was detected. IVUS has determined that the lesion is calcified.  First Diagonal  Branch Vessel is small in size.  Right Coronary Artery Prox RCA lesion is 75% stenosed. Mid RCA lesion is 25% stenosed. Mid RCA to Dist RCA lesion is 50% stenosed. Dist RCA lesion is 99% stenosed. The lesion is chronically occluded with left-to-right collateral flow.  Third Right Posterolateral Branch Collaterals 3rd RPL filled by collaterals from Dist Cx.  Intervention  Prox LAD to Mid LAD lesion Stent CATH LAUNCHER 6FR EBU 3.75 guide catheter was inserted. Lesion crossed with guidewire using a WIRE RUNTHROUGH .[id number]. Pre-stent angioplasty was performed. A drug-eluting stent was successfully placed using a SYNERGY XD 3.50X12. Stent strut is well apposed. Post-stent angioplasty was performed using a BALL SAPPHIRE NC24 3.75X8. Predilatation with 3.5 x 12 shockwave, 70 pulses Post-Intervention Lesion Assessment The intervention was successful. Pre-interventional TIMI flow is 3. Post-intervention TIMI flow is 3. No complications occurred at this lesion. Ultrasound (IVUS) was performed on the lesion post PCI using a CATH OPTICROSS HD. Stent well apposed. There is a 0% residual stenosis post intervention.     ECHOCARDIOGRAM  ECHOCARDIOGRAM COMPLETE 07/17/2021  Narrative ECHOCARDIOGRAM REPORT    Patient Name:   Luke [name] Date of Exam: 07/17/2021 Medical Rec #:  [id number]        Height:       74.0 in Accession #:    [id number]       Weight:       185.0 lb Date of Birth:  July 05, 1945         BSA:          2.103 m Patient Age:    75 years         BP:           141/74 mmHg Patient Gender: M                HR:           65 bpm. Exam Location:  Inpatient  Procedure: 2D Echo  Indications:    CAD  History:        Patient has prior history of Echocardiogram examinations, most recent 07/16/2017. CAD and Previous Myocardial Infarction, Aortic Dissection, Arrythmias:Atrial Fibrillation; Risk Factors:Diabetes, Dyslipidemia and Hypertension.  Sonographer:    Devonne Doughty Referring Phys: [phone] THOMAS A KELLY  IMPRESSIONS   1. Left ventricular ejection fraction, by estimation, is 40 to 45%. The left ventricle has mildly decreased function. The left ventricle demonstrates regional wall motion abnormalities (see scoring diagram/findings for description). Left ventricular diastolic parameters are consistent with Grade I diastolic dysfunction (impaired relaxation). 2. Right ventricular systolic function is normal. The right ventricular size is mildly enlarged. There is normal pulmonary artery systolic pressure. 3. The mitral valve is normal in structure. Trivial mitral valve regurgitation. 4. The aortic valve is tricuspid. There is mild calcification of the aortic valve. There is mild thickening of the aortic valve. Aortic valve regurgitation is not visualized. Aortic valve sclerosis is present, with no evidence of aortic valve stenosis.  Comparison(s): Prior images unable to be  directly viewed, comparison made by report only. The left ventricular function is worsened. The left ventricular wall motion abnormalities are new.  FINDINGS Left Ventricle: Left ventricular ejection fraction, by estimation, is 40 to 45%. The left ventricle has mildly decreased function. The left ventricle demonstrates regional wall motion abnormalities. The left ventricular internal cavity size was normal in size. There is no left ventricular hypertrophy. Left ventricular diastolic parameters are consistent with Grade I diastolic dysfunction (impaired relaxation). Normal left ventricular filling pressure.   LV Wall Scoring: The inferior wall and posterior wall are akinetic.  Right Ventricle: The right ventricular size is mildly enlarged. No increase in right ventricular wall thickness. Right ventricular systolic function is normal. There is normal pulmonary artery systolic pressure. The tricuspid regurgitant velocity is 2.64 m/s, and with an assumed right atrial pressure of 3 mmHg,  the estimated right ventricular systolic pressure is 30.9 mmHg.  Left Atrium: Left atrial size was normal in size.  Right Atrium: Right atrial size was normal in size.  Pericardium: There is no evidence of pericardial effusion. Presence of epicardial fat layer.  Mitral Valve: The mitral valve is normal in structure. Trivial mitral valve regurgitation.  Tricuspid Valve: The tricuspid valve is normal in structure. Tricuspid valve regurgitation is trivial.  Aortic Valve: The aortic valve is tricuspid. There is mild calcification of the aortic valve. There is mild thickening of the aortic valve. Aortic valve regurgitation is not visualized. Aortic valve sclerosis is present, with no evidence of aortic valve stenosis. Aortic valve mean gradient measures 4.5 mmHg. Aortic valve peak gradient measures 8.2 mmHg. Aortic valve area, by VTI measures 1.77 cm.  Pulmonic Valve: The pulmonic valve was normal in structure. Pulmonic valve regurgitation is not visualized.  Aorta: The aortic root is normal in size and structure.  IAS/Shunts: The interatrial septum was not well visualized.  Additional Comments: A device lead is visualized in the right ventricle.   LEFT VENTRICLE PLAX 2D LVIDd:         4.30 cm   Diastology LVIDs:         3.10 cm   LV e' medial:    6.53 cm/s LV PW:         0.90 cm   LV E/e' medial:  9.8 LV IVS:        0.90 cm   LV e' lateral:   7.51 cm/s LVOT diam:     2.00 cm   LV E/e' lateral: 8.6 LV SV:         55 LV SV Index:   26 LVOT Area:     3.14 cm   RIGHT VENTRICLE RV Basal diam:  4.30 cm RV Mid diam:    4.10 cm RV S prime:     9.36 cm/s TAPSE (M-mode): 1.7 cm  LEFT ATRIUM             Index        RIGHT ATRIUM           Index LA diam:        3.20 cm 1.52 cm/m   RA Area:     22.00 cm LA Vol (A2C):   28.0 ml 13.32 ml/m  RA Volume:   70.60 ml  33.58 ml/m LA Vol (A4C):   46.7 ml 22.21 ml/m LA Biplane Vol: 40.0 ml 19.02 ml/m AORTIC VALVE                      PULMONIC VALVE AV Area (  Vmax):    1.84 cm      PV Vmax:       0.79 m/s AV Area (Vmean):   1.77 cm      PV Peak grad:  2.5 mmHg AV Area (VTI):     1.77 cm AV Vmax:           143.00 cm/s AV Vmean:          100.100 cm/s AV VTI:            0.308 m AV Peak Grad:      8.2 mmHg AV Mean Grad:      4.5 mmHg LVOT Vmax:         83.90 cm/s LVOT Vmean:        56.350 cm/s LVOT VTI:          0.174 m LVOT/AV VTI ratio: 0.56  AORTA Ao Root diam: 2.80 cm Ao Asc diam:  3.30 cm  MITRAL VALVE                TRICUSPID VALVE MV Area (PHT): 2.84 cm     TR Peak grad:   27.9 mmHg MV Decel Time: 267 msec     TR Vmax:        264.00 cm/s MV E velocity: 64.30 cm/s MV A velocity: 100.00 cm/s  SHUNTS MV E/A ratio:  0.64         Systemic VTI:  0.17 m Systemic Diam: 2.00 cm  Mihai Croitoru MD Electronically signed by Thurmon Fair MD Signature Date/Time: 07/17/2021/2:13:04 PM    Final             Risk Assessment/Calculations:             Physical Exam:   VS:  BP 122/68   Pulse 68   Ht 6\' 2"  (1.88 m)   Wt 188 lb (85.3 kg)   BMI 24.14 kg/m    Wt Readings from Last 3 Encounters:  10/22/22 188 lb (85.3 kg)  10/17/22 185 lb (83.9 kg)  10/10/22 181 lb (82.1 kg)    GEN: Well nourished, well developed in no acute distress. Diffuse, red rash with dry skin across bilateral arms, chest, abdomen NECK: No JVD; No carotid bruits CARDIAC: RRR, no murmurs, rubs, gallops. RLE with 1+ edema.  RESPIRATORY:  Clear to auscultation without rales, wheezing or rhonchi  ABDOMEN: Soft, non-tender, non-distended EXTREMITIES:  No edema; No deformity   ASSESSMENT AND PLAN: .    Medication management / Rash -  Diffuse flat, red, itchy rash since resuming Plavix. Did not tolerate Brilinta due to dyspnea. Rx Effient 10mg  every day.  Add Cetirizine 10mg  daily for 3-5 days to help with itching. Recommend utilization of OTC cortisone ointment.  To avoid starting multiple new medications, Rx simvastatin 20mg  every day  as tolerated previously.  Did not tolerate atorvastatin, rosuvastatin.  CAD / HLD, LDL goal <70 - 09/18/22 s/p DES-LAD and 10/10/22 CTO intervention with DESx3 to RCA. Did not tolerate Brilinta (shortness of breath) nor Plavix (significant rash). Rx Effient 10mg  every day and continue Aspirin 81mg  every day.  Additional GDMT  amlodipine, carvedilol, imdur, rosuvastatin. Hopeful to be able to discontinue Imdur if continues to be without angina post intervention.  Heart healthy diet and regular cardiovascular exercise encouraged.    HLD, LDL goal <70 - Intolerance to atorvastatin and rosuvastatin with myalgias.  Previously tolerated simvastatin.  Will start on lower dose 20 mg daily as taking with Amlodipine. Lipid panel, LFT in  2 months. If LDL not at goal, consider Zetia vs PCSK9i.   HTN -  BP well controlled. Continue current antihypertensive regimen amlodipine 10 mg daily, carvedilol 25 mg twice daily, Imdur 30 mg daily..  Discussed to monitor BP at home at least 2 hours after medications and sitting for 5-10 minutes.    ICM/HFrEF - Did not tolerate Spironolactone, Entresto, nor Jardiance due to side effects. Prior LVEF 40-45% but has normalized to 55-65% by [facility] 09/2022. Euvolemic and well compensated on exam.    CHB s/p ICD/CRT/LBBB - Follows with Dr. Graciela Husbands.  No ICD firings.    PAD - upcoming R femoral endarterectomy with Dr. Randie Heinz 10/29/22. Per AHA/ACC guidelines, he is deemed acceptable risk for the planned procedure without additional cardiovascular testing. Will route to surgical team so they are aware.     IPF - Follows with Dr. Marchelle Gearing       Cardiac Rehabilitation Eligibility Assessment  The patient is NOT ready to start cardiac rehabilitation due to: Other (Will need to wait until after endarterectomy 10/29/22)       Dispo: In August as scheduled with Dr. Graciela Husbands and in September with Dr. Anne Fu  Signed, Alver Sorrow, NP

## 2022-10-23 ENCOUNTER — Encounter (HOSPITAL_BASED_OUTPATIENT_CLINIC_OR_DEPARTMENT_OTHER): Payer: Self-pay

## 2022-10-23 ENCOUNTER — Other Ambulatory Visit (HOSPITAL_BASED_OUTPATIENT_CLINIC_OR_DEPARTMENT_OTHER): Payer: Self-pay

## 2022-10-23 ENCOUNTER — Encounter (HOSPITAL_COMMUNITY): Payer: Self-pay

## 2022-10-23 ENCOUNTER — Encounter (HOSPITAL_COMMUNITY)
Admission: RE | Admit: 2022-10-23 | Discharge: 2022-10-23 | Disposition: A | Discharge status: Home / Self Care | Payer: Medicare Other | Source: Ambulatory Visit | Attending: Vascular Surgery | Admitting: Vascular Surgery

## 2022-10-23 ENCOUNTER — Other Ambulatory Visit: Payer: Self-pay

## 2022-10-23 ENCOUNTER — Ambulatory Visit (INDEPENDENT_AMBULATORY_CARE_PROVIDER_SITE_OTHER): Payer: Medicare Other

## 2022-10-23 ENCOUNTER — Encounter: Payer: Self-pay | Admitting: Internal Medicine

## 2022-10-23 VITALS — BP 150/58 | Temp 97.7°F | Resp 18 | Ht 74.0 in | Wt 189.6 lb

## 2022-10-23 DIAGNOSIS — Z955 Presence of coronary angioplasty implant and graft: Secondary | ICD-10-CM | POA: Insufficient documentation

## 2022-10-23 DIAGNOSIS — I700 Atherosclerosis of aorta: Secondary | ICD-10-CM | POA: Diagnosis not present

## 2022-10-23 DIAGNOSIS — I2510 Atherosclerotic heart disease of native coronary artery without angina pectoris: Secondary | ICD-10-CM | POA: Diagnosis not present

## 2022-10-23 DIAGNOSIS — I739 Peripheral vascular disease, unspecified: Secondary | ICD-10-CM

## 2022-10-23 DIAGNOSIS — J439 Emphysema, unspecified: Secondary | ICD-10-CM | POA: Insufficient documentation

## 2022-10-23 DIAGNOSIS — I25118 Atherosclerotic heart disease of native coronary artery with other forms of angina pectoris: Secondary | ICD-10-CM

## 2022-10-23 DIAGNOSIS — E1159 Type 2 diabetes mellitus with other circulatory complications: Secondary | ICD-10-CM

## 2022-10-23 DIAGNOSIS — J479 Bronchiectasis, uncomplicated: Secondary | ICD-10-CM | POA: Diagnosis not present

## 2022-10-23 DIAGNOSIS — Z01812 Encounter for preprocedural laboratory examination: Secondary | ICD-10-CM | POA: Insufficient documentation

## 2022-10-23 DIAGNOSIS — Z01818 Encounter for other preprocedural examination: Secondary | ICD-10-CM

## 2022-10-23 DIAGNOSIS — I255 Ischemic cardiomyopathy: Secondary | ICD-10-CM

## 2022-10-23 DIAGNOSIS — J849 Interstitial pulmonary disease, unspecified: Secondary | ICD-10-CM | POA: Diagnosis not present

## 2022-10-23 HISTORY — DX: Umbilical hernia without obstruction or gangrene: K42.9

## 2022-10-23 LAB — GLUCOSE, CAPILLARY: Glucose-Capillary: 240 mg/dL — ABNORMAL HIGH (ref 70–99)

## 2022-10-23 LAB — CBC
HCT: 39.9 % (ref 39.0–52.0)
Hemoglobin: 13.6 g/dL (ref 13.0–17.0)
MCH: 32.9 pg (ref 26.0–34.0)
MCHC: 34.1 g/dL (ref 30.0–36.0)
MCV: 96.4 fL (ref 80.0–100.0)
Platelets: 322 10*3/uL (ref 150–400)
RBC: 4.14 MIL/uL — ABNORMAL LOW (ref 4.22–5.81)
RDW: 13.8 % (ref 11.5–15.5)
WBC: 12.3 10*3/uL — ABNORMAL HIGH (ref 4.0–10.5)
nRBC: 0 % (ref 0.0–0.2)

## 2022-10-23 LAB — TYPE AND SCREEN
ABO/RH(D): A NEG
Antibody Screen: NEGATIVE

## 2022-10-23 LAB — BASIC METABOLIC PANEL
Anion gap: 19 — ABNORMAL HIGH (ref 5–15)
BUN: 27 mg/dL — ABNORMAL HIGH (ref 8–23)
CO2: 16 mmol/L — ABNORMAL LOW (ref 22–32)
Calcium: 9.3 mg/dL (ref 8.9–10.3)
Chloride: 101 mmol/L (ref 98–111)
Creatinine, Ser: 1.21 mg/dL (ref 0.61–1.24)
GFR, Estimated: >60 mL/min (ref >60)
Glucose, Bld: 277 mg/dL — ABNORMAL HIGH (ref 70–99)
Potassium: 4.7 mmol/L (ref 3.5–5.1)
Sodium: 136 mmol/L (ref 135–145)

## 2022-10-23 LAB — SURGICAL PCR SCREEN
MRSA, PCR: NEGATIVE
Staphylococcus aureus: POSITIVE — AB

## 2022-10-23 LAB — HEMOGLOBIN A1C
Hgb A1c MFr Bld: 7.9 % — ABNORMAL HIGH (ref 4.8–5.6)
Mean Plasma Glucose: 180.03 mg/dL

## 2022-10-23 MED ORDER — PRASUGREL HCL 10 MG PO TABS
10.0000 mg | ORAL_TABLET | Freq: Every day | ORAL | 5 refills | Status: DC
Start: 2022-10-23 — End: 2022-12-02
  Filled 2022-10-23: qty 30, 30d supply, fill #0

## 2022-10-23 NOTE — Progress Notes (Signed)
PCP - Dwana Curd. Para March, MD Cardiologist - Donato Schultz, MD  PPM/ICD - Abbott/ [employer]. Jude Device Orders - Faxed/ Recieved Rep Notified - yes  Chest x-ray -  EKG - 10/22/2022 Stress Test - 10/09/2011 ECHO - 07/17/2021 Cardiac Cath - 09/18/2022  Sleep Study - Denies  DM: Type II Fasting Blood Sugar : 83-120 Checks Blood Sugar twice  Blood Thinner Instructions: Follow surgeons instructions on when/if to stop Effient. If no instructions were given to you please call your surgeons office. Aspirin Instructions: Follow surgeons instructions on aspirin 81 mg.  ERAS Protcol - NPO PRE-SURGERY Ensure or G2- n/a  COVID TEST- No   Anesthesia review: Yes, ICD/ Pacemaker  Patient denies shortness of breath, fever, cough and chest pain at PAT appointment   All instructions explained to the patient, with a verbal understanding of the material. Patient agrees to go over the instructions while at home for a better understanding.The opportunity to ask questions was provided.

## 2022-10-23 NOTE — Progress Notes (Signed)
PERIOPERATIVE PRESCRIPTION FOR IMPLANTED CARDIAC DEVICE PROGRAMMING  Patient Information: Name:  Luke [name]  DOB:  October 26, 1945  MRN:  [id number]   Planned Procedure:  Right common femoral endarterectomy with right femoral to popliteal artery bypass  Surgeon:  Dr. Lemar Livings  Date of Procedure:  10/29/22  Cautery will be used.  Position during surgery:  Supine   Device Information:  Clinic EP Physician:  Sherryl Manges, MD   Device Type:  Defibrillator Manufacturer and Phone #:  St. Jude/Abbott: [phone] Pacemaker Dependent?:  Yes.   Date of Last Device Check:  10/23/2022 Normal Device Function?:  Yes.    Electrophysiologist's Recommendations:  Have magnet available. Provide continuous ECG monitoring when magnet is used or reprogramming is to be performed.  Procedure should not interfere with device function.  No device programming or magnet placement needed.  Per Device Clinic Standing Orders, Wiliam Ke, RN  12:30 PM 10/23/2022

## 2022-10-23 NOTE — Pre-Procedure Instructions (Signed)
Surgical Instructions    Your procedure is scheduled on Tuesday, June 25th.  Report to [facility] Main Entrance "A" at 5:30 A.M., then check in with the Admitting office.  Call this number if you have problems the morning of surgery:  [phone]  If you have any questions prior to your surgery date call [phone]: Open Monday-Friday 8am-4pm If you experience any cold or flu symptoms such as cough, fever, chills, shortness of breath, etc. between now and your scheduled surgery, please notify us at the above number.     Remember:  Do not eat or drink after midnight the night before your surgery   Take these medicines the morning of surgery with A SIP OF WATER  amLODipine (NORVASC)  aspirin EC  carvedilol (COREG)  cetirizine (ZYRTEC)  ESBRIET  isosorbide mononitrate (IMDUR)  pantoprazole (PROTONIX)  umeclidinium bromide (INCRUSE ELLIPTA)  albuterol (VENTOLIN HFA) 108 (90 Base) MCG/ACT inhaler -if needed.  As of today, STOP taking any Aleve, Naproxen, Ibuprofen, Motrin, Advil, Goody's, BC's, all herbal medications, fish oil, and all vitamins.   Follow your surgeon's instructions on when to stop prasugrel (EFFIENT).  If no instructions were given by your surgeon then you will need to call the office to get those instructions.     WHAT DO I DO ABOUT MY DIABETES MEDICATION?   Do not take metFORMIN (GLUCOPHAGE) or glipiZIDE (GLUCOTROL) the morning of surgery.  THE NIGHT BEFORE SURGERY, take 17 units of insulin glargine (LANTUS SOLOSTAR) insulin.      HOW TO MANAGE YOUR DIABETES BEFORE AND AFTER SURGERY  Why is it important to control my blood sugar before and after surgery? Improving blood sugar levels before and after surgery helps healing and can limit problems. A way of improving blood sugar control is eating a healthy diet by:  Eating less sugar and carbohydrates  Increasing activity/exercise  Talking with your doctor about reaching your blood sugar goals High blood  sugars (greater than 180 mg/dL) can raise your risk of infections and slow your recovery, so you will need to focus on controlling your diabetes during the weeks before surgery. Make sure that the doctor who takes care of your diabetes knows about your planned surgery including the date and location.  How do I manage my blood sugar before surgery? Check your blood sugar at least 4 times a day, starting 2 days before surgery, to make sure that the level is not too high or low.  Check your blood sugar the morning of your surgery when you wake up and every 2 hours until you get to the Short Stay unit.  If your blood sugar is less than 70 mg/dL, you will need to treat for low blood sugar: Do not take insulin. Treat a low blood sugar (less than 70 mg/dL) with  cup of clear juice (cranberry or apple), 4 glucose tablets, OR glucose gel. Recheck blood sugar in 15 minutes after treatment (to make sure it is greater than 70 mg/dL). If your blood sugar is not greater than 70 mg/dL on recheck, call [phone] for further instructions. Report your blood sugar to the short stay nurse when you get to Short Stay.  If you are admitted to the hospital after surgery: Your blood sugar will be checked by the staff and you will probably be given insulin after surgery (instead of oral diabetes medicines) to make sure you have good blood sugar levels. The goal for blood sugar control after surgery is 80-180 mg/dL.  Do NOT Smoke (Tobacco/Vaping) for 24 hours prior to your procedure.  If you use a CPAP at night, you may bring your mask/headgear for your overnight stay.   Contacts, glasses, piercing's, hearing aid's, dentures or partials may not be worn into surgery, please bring cases for these belongings.    For patients admitted to the hospital, discharge time will be determined by your treatment team.   Patients discharged the day of surgery will not be allowed to drive home, and someone  needs to stay with them for 24 hours.  SURGICAL WAITING ROOM VISITATION Patients having surgery or a procedure may have no more than 2 support people in the waiting area - these visitors may rotate.   Children under the age of 6 must have an adult with them who is not the patient. If the patient needs to stay at the hospital during part of their recovery, the visitor guidelines for inpatient rooms apply. Pre-op nurse will coordinate an appropriate time for 1 support person to accompany patient in pre-op.  This support person may not rotate.   Please refer to the [facility] website for the visitor guidelines for Inpatients (after your surgery is over and you are in a regular room).    Special instructions:   [place]- Preparing For Surgery  Before surgery, you can play an important role. Because skin is not sterile, your skin needs to be as free of germs as possible. You can reduce the number of germs on your skin by washing with CHG (chlorahexidine gluconate) Soap before surgery.  CHG is an antiseptic cleaner which kills germs and bonds with the skin to continue killing germs even after washing.    Oral Hygiene is also important to reduce your risk of infection.  Remember - BRUSH YOUR TEETH THE MORNING OF SURGERY WITH YOUR REGULAR TOOTHPASTE  Please do not use if you have an allergy to CHG or antibacterial soaps. If your skin becomes reddened/irritated stop using the CHG.  Do not shave (including legs and underarms) for at least 48 hours prior to first CHG shower. It is OK to shave your face.  Please follow these instructions carefully.   Shower the NIGHT BEFORE SURGERY and the MORNING OF SURGERY  If you chose to wash your hair, wash your hair first as usual with your normal shampoo.  After you shampoo, rinse your hair and body thoroughly to remove the shampoo.  Use CHG Soap as you would any other liquid soap. You can apply CHG directly to the skin and wash gently with a scrungie or  a clean washcloth.   Apply the CHG Soap to your body ONLY FROM THE NECK DOWN.  Do not use on open wounds or open sores. Avoid contact with your eyes, ears, mouth and genitals (private parts). Wash Face and genitals (private parts)  with your normal soap.   Wash thoroughly, paying special attention to the area where your surgery will be performed.  Thoroughly rinse your body with warm water from the neck down.  DO NOT shower/wash with your normal soap after using and rinsing off the CHG Soap.  Pat yourself dry with a CLEAN TOWEL.  Wear CLEAN PAJAMAS to bed the night before surgery  Place CLEAN SHEETS on your bed the night before your surgery  DO NOT SLEEP WITH PETS.   Day of Surgery: Take a shower with CHG soap. Do not wear jewelry  Do not wear lotions, powders, colognes, or deodorant. Do not shave 48 hours  prior to surgery.  Men may shave face and neck. Do not bring valuables to the hospital.  [facility] is not responsible for any belongings or valuables. Wear Clean/Comfortable clothing the morning of surgery Remember to brush your teeth WITH YOUR REGULAR TOOTHPASTE.   Please read over the following fact sheets that you were given.    If you received a COVID test during your pre-op visit  it is requested that you wear a mask when out in public, stay away from anyone that may not be feeling well and notify your surgeon if you develop symptoms. If you have been in contact with anyone that has tested positive in the last 10 days please notify you surgeon.

## 2022-10-24 LAB — CUP PACEART REMOTE DEVICE CHECK
Battery Remaining Longevity: 72 mo
Battery Remaining Percentage: 76 %
Battery Voltage: 2.96 V
Brady Statistic AP VP Percent: 20 %
Brady Statistic AP VS Percent: 1 %
Brady Statistic AS VP Percent: 79 %
Brady Statistic AS VS Percent: 1 %
Brady Statistic RA Percent Paced: 19 %
Date Time Interrogation Session: 20240619020511
HighPow Impedance: 72 Ohm
Implantable Lead Connection Status: 753985
Implantable Lead Connection Status: 753985
Implantable Lead Connection Status: 753985
Implantable Lead Implant Date: 20140313
Implantable Lead Implant Date: 20140313
Implantable Lead Implant Date: 20140313
Implantable Lead Location: 753858
Implantable Lead Location: 753859
Implantable Lead Location: 753860
Implantable Pulse Generator Implant Date: 20220622
Lead Channel Impedance Value: 380 Ohm
Lead Channel Impedance Value: 410 Ohm
Lead Channel Impedance Value: 750 Ohm
Lead Channel Pacing Threshold Amplitude: 0.5 V
Lead Channel Pacing Threshold Amplitude: 0.625 V
Lead Channel Pacing Threshold Amplitude: 1.625 V
Lead Channel Pacing Threshold Pulse Width: 0.5 ms
Lead Channel Pacing Threshold Pulse Width: 0.5 ms
Lead Channel Pacing Threshold Pulse Width: 0.6 ms
Lead Channel Sensing Intrinsic Amplitude: 11.8 mV
Lead Channel Sensing Intrinsic Amplitude: 2.8 mV
Lead Channel Setting Pacing Amplitude: 1.625
Lead Channel Setting Pacing Amplitude: 2 V
Lead Channel Setting Pacing Amplitude: 2.125
Lead Channel Setting Pacing Pulse Width: 0.5 ms
Lead Channel Setting Pacing Pulse Width: 0.6 ms
Lead Channel Setting Sensing Sensitivity: 0.5 mV
Pulse Gen Serial Number: 810030647

## 2022-10-24 NOTE — Progress Notes (Addendum)
Anesthesia Chart Review:  Follows with cardiology for hx of  CHB s/p Saint Jude CRT-ICD, CAD ( 09/18/22 s/p DES-LAD and 10/10/22 CTO intervention with DESx3 to RCA.), HLD, ICM (prior LVEF 45-50%, LVEF by [facility] 09/2022 55-65%), PAD, AAA s/p EVAR 2013.  He saw Dr. Anne Fu 09/11/22 for anginal symptoms in anticipation of RLE revascularization. Prior LHC 2013 and 2023 resulted in medical mgmt of distal RCA CTO with collaterals. However, with new anginal symptoms on 09/18/22 underwent orbital atherectomy of LAD. Seen in follow up 09/24/2022 with significant improvement in exertional dyspnea. ED visit 10/05/2022 with redness, swelling to RLE with no pain.  He also noted some myalgias on Crestor was stopped.  Given dose of PO Benadryl. Recurrent ED visit 10/07/22 with generalized rash Plavix was stopped and transition to Brilinta. On 10/10/22 he underwent PCI of RCA with stents placed in the distal, mid, proximal RCA with distal and mid stents overlapping.  Seen in follow-up by Gillian Shields, NP on 10/22/2022.  At that time he was noted to have diffuse rash with Plavix and did not tolerate Brilinta due to dyspnea.  At that time he was prescribed Effient 10 mg daily.  Upcoming surgery was also discussed.  Per note, "PAD - upcoming R femoral endarterectomy with Dr. Randie Heinz 10/29/22. Per AHA/ACC guidelines, he is deemed acceptable risk for the planned procedure without additional cardiovascular testing. Will route to surgical team so they are aware."  Dr. Randie Heinz okay with patient continuing Effient.  Patient is a former smoker, 80 pack years, quit 2006.  History of IPF, diagnosed November 2022, followed by pulmonologist Dr. Marchelle Gearing. Last chest CT and PFTs felt stable. He continued to use exercise bike at home.  He does not use home oxygen.  He is maintained on Esbriet and Incruse Ellipta.  History of posterior C2-4 cervical fusion.  Preop labs reviewed, creatinine mildly elevated at 1.21, glucose elevated 277 consistent with poorly  controlled IDDM 2.  A1c 7.9.   EKG 10/22/2022: Atrial sensed ventricular paced rhythm.  Rate 68.  Perioperative prescription for implanted cardiac device programming per progress note 10/23/2022: Device Information:   Clinic EP Physician:  Sherryl Manges, MD    Device Type:  Defibrillator Manufacturer and Phone #:  St. Jude/Abbott: [phone] Pacemaker Dependent?:  Yes.   Date of Last Device Check:  10/23/2022       Normal Device Function?:  Yes.     Electrophysiologist's Recommendations:   Have magnet available. Provide continuous ECG monitoring when magnet is used or reprogramming is to be performed.  Procedure should not interfere with device function.  No device programming or magnet placement needed.  HRCT chest 03/25/2022: IMPRESSION: 1. Right lower lobe lower lobe pulmonary nodules are no longer present, compatible with resolved infectious or inflammatory process. 2. Subpleural and lower lung predominant fibrotic interstitial lung disease with traction bronchiectasis and honeycomb change. No evidence of progression. Findings are consistent with UIP per consensus guidelines: Diagnosis of Idiopathic Pulmonary Fibrosis: An Official ATS/ERS/JRS/ALAT Clinical Practice Guideline. Am Rosezetta Schlatter Crit Care Med Vol 198, Iss 5, [phone], Jan 04 2017. 3. Aortic Atherosclerosis (ICD10-I70.0) and Emphysema (ICD10-J43.9).  Coronary CTO intervention 10/10/2022:   Dist RCA lesion is 99% stenosed, functionally a chronic total occlusion.  A drug-eluting stent was successfully placed using a SYNERGY XD 2.50X24, postdilated to 2.9 mm and optimized with intravascular ultrasound.   Post intervention, there is a 0% residual stenosis.   Mid RCA lesion is 75% stenosed.  A drug-eluting stent was successfully placed using  a SYNERGY XD 3.0X16, postdilated to 3.5 mm and optimized with intravascular ultrasound.   Post intervention, there is a 0% residual stenosis.   Prox RCA lesion is 75% stenosed.   Severely calcified.  After rotational atherectomy, A drug-eluting stent was successfully placed using a STENT MEGATRON 3.5X28, postdilated to > 3.5 mm and optimized with intravascular ultrasound.   Prox RCA to Mid RCA lesion is 25% stenosed with 25% stenosed side branch in RV Branch.   Post intervention, there is a 0% residual stenosis.   The left ventricular systolic function is normal.   LV end diastolic pressure is normal.   The left ventricular ejection fraction is 55-65% by visual estimate.   There is no aortic valve stenosis.   In the absence of any other complications or medical issues, we expect the patient to be ready for discharge from an interventional cardiology perspective on 10/11/2022.   Recommend uninterrupted dual antiplatelet therapy with Aspirin 81mg  daily and Ticagrelor 90mg  twice daily for given diffuse disease, would plan longer P2Y12 therapy.   Successful CTO PCI of the RCA with stents placed in the distal, mid and proximal RCA.  The distal and mid stents overlap.   The patient will be watched overnight.  Results conveyed to his wife.   He continues to have a right foot wound with planned vascular surgery in a few weeks.  Will have wound care evaluate the patient in the hospital.   Would plan for prolonged more intense antiplatelet therapy given his diffuse CAD and multiple stents.  Cath and PCI 09/18/2022:   Mid RCA to Dist RCA lesion is 50% stenosed.   Prox RCA lesion is 75% stenosed.   Mid RCA lesion is 25% stenosed.   Dist RCA lesion is 99% stenosed.   Prox LAD to Mid LAD lesion is 80% stenosed.   After IVL with 3.5 x 12mm Shockwave,  drug-eluting stent was successfully placed using a SYNERGY XD 3.50X12, postdilated to > 3.75 mm and optimized with IVUS.   Post intervention, there is a 0% residual stenosis.   The left ventricular systolic function is normal.   LV end diastolic pressure is normal.   The left ventricular ejection fraction is 55-65% by visual  estimate.   There is no aortic valve stenosis.   Successful IVL followed by stent placement of LAD.   CTO of distal RCA, now with a channel which may facilitate CTO PCI.  Plan same day discharge.     Zannie Cove [facility] Short Stay Center/Anesthesiology Phone [phone] 10/24/2022 11:05 AM

## 2022-10-24 NOTE — Anesthesia Preprocedure Evaluation (Deleted)
Anesthesia Evaluation    Airway        Dental   Pulmonary former smoker          Cardiovascular hypertension,      Neuro/Psych    GI/Hepatic   Endo/Other  diabetes    Renal/GU      Musculoskeletal   Abdominal   Peds  Hematology   Anesthesia Other Findings   Reproductive/Obstetrics                              Anesthesia Physical Anesthesia Plan  ASA:   Anesthesia Plan:    Post-op Pain Management:    Induction:   PONV Risk Score and Plan:   Airway Management Planned:   Additional Equipment:   Intra-op Plan:   Post-operative Plan:   Informed Consent:   Plan Discussed with:   Anesthesia Plan Comments: (PAT note by Antionette Poles, PA-C:  Follows with cardiology for hx of  CHB s/p Saint Jude CRT-ICD, CAD ( 09/18/22 s/p DES-LAD and 10/10/22 CTO intervention with DESx3 to RCA.), HLD, ICM (prior LVEF 45-50%, LVEF by [facility] 09/2022 55-65%), PAD, AAA s/p EVAR 2013.  He saw Dr. Anne Fu 09/11/22 for anginal symptoms in anticipation of RLE revascularization. Prior LHC 2013 and 2023 resulted in medical mgmt of distal RCA CTO with collaterals. However, with new anginal symptoms on 09/18/22 underwent orbital atherectomy of LAD. Seen in follow up 09/24/2022 with significant improvement in exertional dyspnea. ED visit 10/05/2022 with redness, swelling to RLE with no pain.  He also noted some myalgias on Crestor was stopped.  Given dose of PO Benadryl. Recurrent ED visit 10/07/22 with generalized rash Plavix was stopped and transition to Brilinta. On 10/10/22 he underwent PCI of RCA with stents placed in the distal, mid, proximal RCA with distal and mid stents overlapping.  Seen in follow-up by Gillian Shields, NP on 10/22/2022.  At that time he was noted to have diffuse rash with Plavix and did not tolerate Brilinta due to dyspnea.  At that time he was prescribed Effient 10 mg daily.  Upcoming surgery was also discussed.  Per  note, "PAD - upcoming R femoral endarterectomy with Dr. Randie Heinz 10/29/22. Per AHA/ACC guidelines, he is deemed acceptable risk for the planned procedure without additional cardiovascular testing. Will route to surgical team so they are aware."  Dr. Randie Heinz okay with patient continuing Effient.  Patient is a former smoker, 80 pack years, quit 2006.  History of IPF, diagnosed November 2022, followed by pulmonologist Dr. Marchelle Gearing. Last chest CT and PFTs felt stable. He continued to use exercise bike at home.  He does not use home oxygen.  He is maintained on Esbriet and Incruse Ellipta.  History of posterior C2-4 cervical fusion.  Preop labs reviewed, creatinine mildly elevated at 1.21, glucose elevated 277 consistent with poorly controlled IDDM 2.  A1c 7.9.   EKG 10/22/2022: Atrial sensed ventricular paced rhythm.  Rate 68.  Perioperative prescription for implanted cardiac device programming per progress note 10/23/2022: Device Information:   Clinic EP Physician:  Sherryl Manges, MD    Device Type:  Defibrillator Manufacturer and Phone #:  St. Jude/Abbott: [phone] Pacemaker Dependent?:  Yes.   Date of Last Device Check:  10/23/2022       Normal Device Function?:  Yes.     Electrophysiologist's Recommendations:   ? Have magnet available. ? Provide continuous ECG monitoring when magnet is used or reprogramming is to be performed.  ?  Procedure should not interfere with device function.  No device programming or magnet placement needed.  HRCT chest 03/25/2022: IMPRESSION: 1. Right lower lobe lower lobe pulmonary nodules are no longer present, compatible with resolved infectious or inflammatory process. 2. Subpleural and lower lung predominant fibrotic interstitial lung disease with traction bronchiectasis and honeycomb change. No evidence of progression. Findings are consistent with UIP per consensus guidelines: Diagnosis of Idiopathic Pulmonary Fibrosis: An Official ATS/ERS/JRS/ALAT  Clinical Practice Guideline. Am Rosezetta Schlatter Crit Care Med Vol 198, Iss 5, [phone], Jan 04 2017. 3. Aortic Atherosclerosis (ICD10-I70.0) and Emphysema (ICD10-J43.9).  Coronary CTO intervention 10/10/2022:   Dist RCA lesion is 99% stenosed, functionally a chronic total occlusion.  A drug-eluting stent was successfully placed using a SYNERGY XD 2.50X24, postdilated to 2.9 mm and optimized with intravascular ultrasound.   Post intervention, there is a 0% residual stenosis.   Mid RCA lesion is 75% stenosed.  A drug-eluting stent was successfully placed using a SYNERGY XD 3.0X16, postdilated to 3.5 mm and optimized with intravascular ultrasound.   Post intervention, there is a 0% residual stenosis.   Prox RCA lesion is 75% stenosed.  Severely calcified.  After rotational atherectomy, A drug-eluting stent was successfully placed using a STENT MEGATRON 3.5X28, postdilated to > 3.5 mm and optimized with intravascular ultrasound.   Prox RCA to Mid RCA lesion is 25% stenosed with 25% stenosed side branch in RV Branch.   Post intervention, there is a 0% residual stenosis.   The left ventricular systolic function is normal.   LV end diastolic pressure is normal.   The left ventricular ejection fraction is 55-65% by visual estimate.   There is no aortic valve stenosis.   In the absence of any other complications or medical issues, we expect the patient to be ready for discharge from an interventional cardiology perspective on 10/11/2022.   Recommend uninterrupted dual antiplatelet therapy with Aspirin 81mg  daily and Ticagrelor 90mg  twice daily for given diffuse disease, would plan longer P2Y12 therapy.   Successful CTO PCI of the RCA with stents placed in the distal, mid and proximal RCA.  The distal and mid stents overlap.   The patient will be watched overnight.  Results conveyed to his wife.   He continues to have a right foot wound with planned vascular surgery in a few weeks.  Will have wound  care evaluate the patient in the hospital.   Would plan for prolonged more intense antiplatelet therapy given his diffuse CAD and multiple stents.  Cath and PCI 09/18/2022:   Mid RCA to Dist RCA lesion is 50% stenosed.   Prox RCA lesion is 75% stenosed.   Mid RCA lesion is 25% stenosed.   Dist RCA lesion is 99% stenosed.   Prox LAD to Mid LAD lesion is 80% stenosed.   After IVL with 3.5 x 12mm Shockwave,  drug-eluting stent was successfully placed using a SYNERGY XD 3.50X12, postdilated to > 3.75 mm and optimized with IVUS.   Post intervention, there is a 0% residual stenosis.   The left ventricular systolic function is normal.   LV end diastolic pressure is normal.   The left ventricular ejection fraction is 55-65% by visual estimate.   There is no aortic valve stenosis.   Successful IVL followed by stent placement of LAD.   CTO of distal RCA, now with a channel which may facilitate CTO PCI.  Plan same day discharge.    )         Anesthesia Quick  Evaluation

## 2022-10-25 ENCOUNTER — Telehealth: Payer: Self-pay

## 2022-10-25 NOTE — Telephone Encounter (Signed)
Late entry for 6/20: Notified by Antionette Poles, PA that patient is now taking Effient for the placement of four cardiac stents placed in the past two months. Patient is scheduled for a right common femoral endarterectomy and femoral-popliteal bypass on 6/25. Contacted Dr. Randie Heinz to inquire if OK to proceed while patient continues medication or if surgery needs to be delayed until he can safely stop medication. Dr. Randie Heinz advised OK to proceed.   6/21: Spoke with patient and advised to continue taking Effient as prescribed and surgery will proceed as scheduled. Patient voiced understanding.

## 2022-10-29 ENCOUNTER — Other Ambulatory Visit: Payer: Self-pay

## 2022-10-29 ENCOUNTER — Inpatient Hospital Stay (HOSPITAL_COMMUNITY)
Admission: RE | Admit: 2022-10-29 | Discharge: 2022-11-01 | DRG: 291 | Disposition: A | Discharge status: Home Health | Payer: Medicare Other | Attending: Internal Medicine | Admitting: Internal Medicine

## 2022-10-29 ENCOUNTER — Encounter (HOSPITAL_COMMUNITY): Payer: Self-pay | Admitting: Vascular Surgery

## 2022-10-29 ENCOUNTER — Encounter (HOSPITAL_COMMUNITY)
Admission: RE | Disposition: A | Discharge status: Home Health | Payer: Self-pay | Source: Home / Self Care | Attending: Internal Medicine

## 2022-10-29 ENCOUNTER — Inpatient Hospital Stay (HOSPITAL_COMMUNITY): Payer: Medicare Other

## 2022-10-29 DIAGNOSIS — E1151 Type 2 diabetes mellitus with diabetic peripheral angiopathy without gangrene: Secondary | ICD-10-CM | POA: Diagnosis present

## 2022-10-29 DIAGNOSIS — I2510 Atherosclerotic heart disease of native coronary artery without angina pectoris: Secondary | ICD-10-CM | POA: Diagnosis not present

## 2022-10-29 DIAGNOSIS — Z888 Allergy status to other drugs, medicaments and biological substances status: Secondary | ICD-10-CM

## 2022-10-29 DIAGNOSIS — I130 Hypertensive heart and chronic kidney disease with heart failure and stage 1 through stage 4 chronic kidney disease, or unspecified chronic kidney disease: Secondary | ICD-10-CM | POA: Diagnosis present

## 2022-10-29 DIAGNOSIS — E1122 Type 2 diabetes mellitus with diabetic chronic kidney disease: Secondary | ICD-10-CM | POA: Diagnosis present

## 2022-10-29 DIAGNOSIS — Z87891 Personal history of nicotine dependence: Secondary | ICD-10-CM | POA: Diagnosis not present

## 2022-10-29 DIAGNOSIS — J432 Centrilobular emphysema: Secondary | ICD-10-CM | POA: Diagnosis present

## 2022-10-29 DIAGNOSIS — J9601 Acute respiratory failure with hypoxia: Secondary | ICD-10-CM | POA: Diagnosis present

## 2022-10-29 DIAGNOSIS — Z885 Allergy status to narcotic agent status: Secondary | ICD-10-CM

## 2022-10-29 DIAGNOSIS — Z539 Procedure and treatment not carried out, unspecified reason: Secondary | ICD-10-CM | POA: Diagnosis present

## 2022-10-29 DIAGNOSIS — Z823 Family history of stroke: Secondary | ICD-10-CM

## 2022-10-29 DIAGNOSIS — I255 Ischemic cardiomyopathy: Secondary | ICD-10-CM | POA: Diagnosis present

## 2022-10-29 DIAGNOSIS — M7989 Other specified soft tissue disorders: Secondary | ICD-10-CM | POA: Diagnosis present

## 2022-10-29 DIAGNOSIS — I10 Essential (primary) hypertension: Secondary | ICD-10-CM | POA: Diagnosis present

## 2022-10-29 DIAGNOSIS — Z83438 Family history of other disorder of lipoprotein metabolism and other lipidemia: Secondary | ICD-10-CM

## 2022-10-29 DIAGNOSIS — E785 Hyperlipidemia, unspecified: Secondary | ICD-10-CM | POA: Diagnosis present

## 2022-10-29 DIAGNOSIS — E11621 Type 2 diabetes mellitus with foot ulcer: Secondary | ICD-10-CM | POA: Diagnosis present

## 2022-10-29 DIAGNOSIS — Z8249 Family history of ischemic heart disease and other diseases of the circulatory system: Secondary | ICD-10-CM

## 2022-10-29 DIAGNOSIS — I4891 Unspecified atrial fibrillation: Secondary | ICD-10-CM | POA: Diagnosis present

## 2022-10-29 DIAGNOSIS — E876 Hypokalemia: Secondary | ICD-10-CM | POA: Diagnosis not present

## 2022-10-29 DIAGNOSIS — N1831 Chronic kidney disease, stage 3a: Secondary | ICD-10-CM | POA: Diagnosis present

## 2022-10-29 DIAGNOSIS — E1169 Type 2 diabetes mellitus with other specified complication: Secondary | ICD-10-CM | POA: Diagnosis present

## 2022-10-29 DIAGNOSIS — L853 Xerosis cutis: Secondary | ICD-10-CM | POA: Diagnosis present

## 2022-10-29 DIAGNOSIS — L89892 Pressure ulcer of other site, stage 2: Secondary | ICD-10-CM | POA: Diagnosis present

## 2022-10-29 DIAGNOSIS — L03115 Cellulitis of right lower limb: Secondary | ICD-10-CM | POA: Diagnosis present

## 2022-10-29 DIAGNOSIS — J84112 Idiopathic pulmonary fibrosis: Secondary | ICD-10-CM | POA: Diagnosis present

## 2022-10-29 DIAGNOSIS — Z79899 Other long term (current) drug therapy: Secondary | ICD-10-CM | POA: Diagnosis not present

## 2022-10-29 DIAGNOSIS — I5021 Acute systolic (congestive) heart failure: Secondary | ICD-10-CM | POA: Diagnosis not present

## 2022-10-29 DIAGNOSIS — Z833 Family history of diabetes mellitus: Secondary | ICD-10-CM

## 2022-10-29 DIAGNOSIS — I252 Old myocardial infarction: Secondary | ICD-10-CM

## 2022-10-29 DIAGNOSIS — K219 Gastro-esophageal reflux disease without esophagitis: Secondary | ICD-10-CM | POA: Diagnosis present

## 2022-10-29 DIAGNOSIS — I5023 Acute on chronic systolic (congestive) heart failure: Secondary | ICD-10-CM | POA: Diagnosis not present

## 2022-10-29 DIAGNOSIS — I70221 Atherosclerosis of native arteries of extremities with rest pain, right leg: Secondary | ICD-10-CM | POA: Diagnosis present

## 2022-10-29 DIAGNOSIS — Z95810 Presence of automatic (implantable) cardiac defibrillator: Secondary | ICD-10-CM | POA: Diagnosis present

## 2022-10-29 DIAGNOSIS — I4510 Unspecified right bundle-branch block: Secondary | ICD-10-CM | POA: Diagnosis present

## 2022-10-29 DIAGNOSIS — Z7984 Long term (current) use of oral hypoglycemic drugs: Secondary | ICD-10-CM

## 2022-10-29 DIAGNOSIS — E1159 Type 2 diabetes mellitus with other circulatory complications: Secondary | ICD-10-CM

## 2022-10-29 DIAGNOSIS — Z8679 Personal history of other diseases of the circulatory system: Secondary | ICD-10-CM

## 2022-10-29 DIAGNOSIS — J8410 Pulmonary fibrosis, unspecified: Secondary | ICD-10-CM | POA: Diagnosis not present

## 2022-10-29 DIAGNOSIS — L8990 Pressure ulcer of unspecified site, unspecified stage: Secondary | ICD-10-CM | POA: Diagnosis present

## 2022-10-29 DIAGNOSIS — Z955 Presence of coronary angioplasty implant and graft: Secondary | ICD-10-CM

## 2022-10-29 DIAGNOSIS — L270 Generalized skin eruption due to drugs and medicaments taken internally: Secondary | ICD-10-CM | POA: Diagnosis present

## 2022-10-29 DIAGNOSIS — Z801 Family history of malignant neoplasm of trachea, bronchus and lung: Secondary | ICD-10-CM

## 2022-10-29 DIAGNOSIS — N179 Acute kidney failure, unspecified: Secondary | ICD-10-CM | POA: Diagnosis not present

## 2022-10-29 DIAGNOSIS — J479 Bronchiectasis, uncomplicated: Secondary | ICD-10-CM | POA: Diagnosis present

## 2022-10-29 DIAGNOSIS — Z794 Long term (current) use of insulin: Secondary | ICD-10-CM | POA: Diagnosis not present

## 2022-10-29 DIAGNOSIS — Z880 Allergy status to penicillin: Secondary | ICD-10-CM

## 2022-10-29 DIAGNOSIS — I739 Peripheral vascular disease, unspecified: Secondary | ICD-10-CM | POA: Diagnosis not present

## 2022-10-29 DIAGNOSIS — Z7982 Long term (current) use of aspirin: Secondary | ICD-10-CM

## 2022-10-29 DIAGNOSIS — Z7902 Long term (current) use of antithrombotics/antiplatelets: Secondary | ICD-10-CM

## 2022-10-29 LAB — GLUCOSE, CAPILLARY
Glucose-Capillary: 149 mg/dL — ABNORMAL HIGH (ref 70–99)
Glucose-Capillary: 116 mg/dL — ABNORMAL HIGH (ref 70–99)
Glucose-Capillary: 110 mg/dL — ABNORMAL HIGH (ref 70–99)
Glucose-Capillary: 243 mg/dL — ABNORMAL HIGH (ref 70–99)
Glucose-Capillary: 156 mg/dL — ABNORMAL HIGH (ref 70–99)
Glucose-Capillary: 135 mg/dL — ABNORMAL HIGH (ref 70–99)

## 2022-10-29 LAB — CBC WITH DIFFERENTIAL/PLATELET
Abs Immature Granulocytes: 0.05 10*3/uL (ref 0.00–0.07)
Basophils Absolute: 0.1 10*3/uL (ref 0.0–0.1)
Basophils Relative: 1 %
Eosinophils Absolute: 0.3 10*3/uL (ref 0.0–0.5)
Eosinophils Relative: 3 %
HCT: 40 % (ref 39.0–52.0)
Hemoglobin: 13.3 g/dL (ref 13.0–17.0)
Immature Granulocytes: 1 %
Lymphocytes Relative: 11 %
Lymphs Abs: 1.1 10*3/uL (ref 0.7–4.0)
MCH: 32.8 pg (ref 26.0–34.0)
MCHC: 33.3 g/dL (ref 30.0–36.0)
MCV: 98.5 fL (ref 80.0–100.0)
Monocytes Absolute: 0.9 10*3/uL (ref 0.1–1.0)
Monocytes Relative: 9 %
Neutro Abs: 7.6 10*3/uL (ref 1.7–7.7)
Neutrophils Relative %: 75 %
Platelets: 326 10*3/uL (ref 150–400)
RBC: 4.06 MIL/uL — ABNORMAL LOW (ref 4.22–5.81)
RDW: 14.5 % (ref 11.5–15.5)
WBC: 10.1 10*3/uL (ref 4.0–10.5)
nRBC: 0 % (ref 0.0–0.2)

## 2022-10-29 LAB — COMPREHENSIVE METABOLIC PANEL
ALT: 21 U/L (ref 0–44)
AST: 27 U/L (ref 15–41)
Albumin: 3.5 g/dL (ref 3.5–5.0)
Alkaline Phosphatase: 83 U/L (ref 38–126)
Anion gap: 12 (ref 5–15)
BUN: 24 mg/dL — ABNORMAL HIGH (ref 8–23)
CO2: 19 mmol/L — ABNORMAL LOW (ref 22–32)
Calcium: 9.2 mg/dL (ref 8.9–10.3)
Chloride: 110 mmol/L (ref 98–111)
Creatinine, Ser: 1.15 mg/dL (ref 0.61–1.24)
GFR, Estimated: >60 mL/min (ref >60)
Glucose, Bld: 107 mg/dL — ABNORMAL HIGH (ref 70–99)
Potassium: 4.4 mmol/L (ref 3.5–5.1)
Sodium: 141 mmol/L (ref 135–145)
Total Bilirubin: 0.9 mg/dL (ref 0.3–1.2)
Total Protein: 6.6 g/dL (ref 6.5–8.1)

## 2022-10-29 LAB — TROPONIN I (HIGH SENSITIVITY)
Troponin I (High Sensitivity): 64 ng/L — ABNORMAL HIGH (ref <18)
Troponin I (High Sensitivity): 60 ng/L — ABNORMAL HIGH (ref <18)

## 2022-10-29 LAB — BRAIN NATRIURETIC PEPTIDE: B Natriuretic Peptide: 174 pg/mL — ABNORMAL HIGH (ref 0.0–100.0)

## 2022-10-29 SURGERY — ENDARTERECTOMY, FEMORAL
Anesthesia: General | Laterality: Right

## 2022-10-29 MED ORDER — ASPIRIN 81 MG PO TBEC
81.0000 mg | DELAYED_RELEASE_TABLET | Freq: Every day | ORAL | Status: DC
  Administered 2022-10-30 – 2022-11-01 (×3): 81 mg via ORAL
  Filled 2022-10-29 (×3): qty 1

## 2022-10-29 MED ORDER — ACETAMINOPHEN 650 MG RE SUPP: 650.0000 mg | Freq: Four times a day (QID) | RECTAL | Status: DC | PRN

## 2022-10-29 MED ORDER — CEFAZOLIN SODIUM-DEXTROSE 2-4 GM/100ML-% IV SOLN
2.0000 g | INTRAVENOUS | Status: DC
  Filled 2022-10-29: qty 100

## 2022-10-29 MED ORDER — PROPOFOL 10 MG/ML IV BOLUS
INTRAVENOUS | Status: AC
  Filled 2022-10-29: qty 20

## 2022-10-29 MED ORDER — INSULIN ASPART 100 UNIT/ML IJ SOLN
0.0000 [IU] | INTRAMUSCULAR | Status: DC | PRN
  Administered 2022-10-29: 3 [IU] via SUBCUTANEOUS
  Filled 2022-10-29: qty 1

## 2022-10-29 MED ORDER — BISACODYL 5 MG PO TBEC: 5.0000 mg | DELAYED_RELEASE_TABLET | Freq: Every day | ORAL | Status: DC | PRN

## 2022-10-29 MED ORDER — LORATADINE 10 MG PO TABS
10.0000 mg | ORAL_TABLET | Freq: Every day | ORAL | Status: DC
  Administered 2022-10-30 – 2022-11-01 (×3): 10 mg via ORAL
  Filled 2022-10-29 (×3): qty 1

## 2022-10-29 MED ORDER — PRASUGREL HCL 10 MG PO TABS
10.0000 mg | ORAL_TABLET | Freq: Every day | ORAL | Status: DC
  Administered 2022-10-29 – 2022-11-01 (×4): 10 mg via ORAL
  Filled 2022-10-29 (×5): qty 1

## 2022-10-29 MED ORDER — ALBUTEROL SULFATE (2.5 MG/3ML) 0.083% IN NEBU
2.5000 mg | INHALATION_SOLUTION | Freq: Four times a day (QID) | RESPIRATORY_TRACT | Status: DC
  Administered 2022-10-29: 2.5 mg via RESPIRATORY_TRACT
  Filled 2022-10-29 (×2): qty 3

## 2022-10-29 MED ORDER — ISOSORBIDE MONONITRATE ER 30 MG PO TB24
30.0000 mg | ORAL_TABLET | Freq: Every day | ORAL | Status: DC
  Administered 2022-10-30 – 2022-11-01 (×3): 30 mg via ORAL
  Filled 2022-10-29 (×4): qty 1

## 2022-10-29 MED ORDER — FENTANYL CITRATE (PF) 250 MCG/5ML IJ SOLN
INTRAMUSCULAR | Status: AC
  Filled 2022-10-29: qty 5

## 2022-10-29 MED ORDER — CHLORHEXIDINE GLUCONATE CLOTH 2 % EX PADS: 6.0000 | MEDICATED_PAD | Freq: Once | CUTANEOUS | Status: DC

## 2022-10-29 MED ORDER — ONDANSETRON HCL 4 MG PO TABS: 4.0000 mg | ORAL_TABLET | Freq: Four times a day (QID) | ORAL | Status: DC | PRN

## 2022-10-29 MED ORDER — LACTATED RINGERS IV SOLN: INTRAVENOUS | Status: DC

## 2022-10-29 MED ORDER — POLYETHYLENE GLYCOL 3350 17 G PO PACK: 17.0000 g | PACK | Freq: Every day | ORAL | Status: DC | PRN

## 2022-10-29 MED ORDER — CARVEDILOL 25 MG PO TABS
25.0000 mg | ORAL_TABLET | Freq: Two times a day (BID) | ORAL | Status: DC
  Administered 2022-10-29 – 2022-11-01 (×6): 25 mg via ORAL
  Filled 2022-10-29 (×7): qty 1

## 2022-10-29 MED ORDER — INSULIN ASPART 100 UNIT/ML IJ SOLN: 0.0000 [IU] | Freq: Every day | INTRAMUSCULAR | Status: DC

## 2022-10-29 MED ORDER — PANTOPRAZOLE SODIUM 40 MG PO TBEC
80.0000 mg | DELAYED_RELEASE_TABLET | Freq: Every day | ORAL | Status: DC
  Administered 2022-10-29 – 2022-11-01 (×4): 80 mg via ORAL
  Filled 2022-10-29 (×5): qty 2

## 2022-10-29 MED ORDER — HYDRALAZINE HCL 20 MG/ML IJ SOLN: 5.0000 mg | INTRAMUSCULAR | Status: DC | PRN

## 2022-10-29 MED ORDER — ORAL CARE MOUTH RINSE: 15.0000 mL | Freq: Once | OROMUCOSAL | Status: AC

## 2022-10-29 MED ORDER — ENOXAPARIN SODIUM 40 MG/0.4ML IJ SOSY
40.0000 mg | PREFILLED_SYRINGE | INTRAMUSCULAR | Status: DC
  Administered 2022-10-29 – 2022-10-31 (×3): 40 mg via SUBCUTANEOUS
  Filled 2022-10-29 (×4): qty 0.4

## 2022-10-29 MED ORDER — DOCUSATE SODIUM 100 MG PO CAPS
100.0000 mg | ORAL_CAPSULE | Freq: Two times a day (BID) | ORAL | Status: DC
  Administered 2022-10-31 – 2022-11-01 (×3): 100 mg via ORAL
  Filled 2022-10-29 (×6): qty 1

## 2022-10-29 MED ORDER — SODIUM CHLORIDE 0.9 % IV SOLN: INTRAVENOUS | Status: DC

## 2022-10-29 MED ORDER — ALBUTEROL SULFATE (2.5 MG/3ML) 0.083% IN NEBU: 2.5000 mg | INHALATION_SOLUTION | RESPIRATORY_TRACT | Status: DC | PRN

## 2022-10-29 MED ORDER — SODIUM CHLORIDE 0.9% FLUSH
3.0000 mL | Freq: Two times a day (BID) | INTRAVENOUS | Status: DC
  Administered 2022-10-29 – 2022-11-01 (×7): 3 mL via INTRAVENOUS

## 2022-10-29 MED ORDER — SIMVASTATIN 20 MG PO TABS
20.0000 mg | ORAL_TABLET | Freq: Every day | ORAL | Status: DC
  Administered 2022-10-29 – 2022-10-31 (×3): 20 mg via ORAL
  Filled 2022-10-29 (×4): qty 1

## 2022-10-29 MED ORDER — FUROSEMIDE 10 MG/ML IJ SOLN
40.0000 mg | Freq: Two times a day (BID) | INTRAMUSCULAR | Status: DC
  Administered 2022-10-29 – 2022-10-30 (×3): 40 mg via INTRAVENOUS
  Filled 2022-10-29 (×4): qty 4

## 2022-10-29 MED ORDER — INSULIN ASPART 100 UNIT/ML IJ SOLN
0.0000 [IU] | Freq: Three times a day (TID) | INTRAMUSCULAR | Status: DC
  Administered 2022-10-29: 3 [IU] via SUBCUTANEOUS
  Administered 2022-10-30: 2 [IU] via SUBCUTANEOUS
  Administered 2022-10-31: 3 [IU] via SUBCUTANEOUS
  Administered 2022-10-31: 5 [IU] via SUBCUTANEOUS
  Administered 2022-11-01: 2 [IU] via SUBCUTANEOUS
  Administered 2022-11-01: 3 [IU] via SUBCUTANEOUS

## 2022-10-29 MED ORDER — ACETAMINOPHEN 325 MG PO TABS
650.0000 mg | ORAL_TABLET | Freq: Four times a day (QID) | ORAL | Status: DC | PRN
  Administered 2022-10-29 – 2022-11-01 (×8): 650 mg via ORAL
  Filled 2022-10-29 (×9): qty 2

## 2022-10-29 MED ORDER — INSULIN GLARGINE-YFGN 100 UNIT/ML ~~LOC~~ SOLN
30.0000 [IU] | Freq: Every day | SUBCUTANEOUS | Status: DC
  Administered 2022-10-29 – 2022-11-01 (×4): 30 [IU] via SUBCUTANEOUS
  Filled 2022-10-29 (×4): qty 0.3

## 2022-10-29 MED ORDER — ONDANSETRON HCL 4 MG/2ML IJ SOLN: 4.0000 mg | Freq: Four times a day (QID) | INTRAMUSCULAR | Status: DC | PRN

## 2022-10-29 MED ORDER — CHLORHEXIDINE GLUCONATE 0.12 % MT SOLN
15.0000 mL | Freq: Once | OROMUCOSAL | Status: AC
  Administered 2022-10-29: 15 mL via OROMUCOSAL
  Filled 2022-10-29: qty 15

## 2022-10-29 MED ORDER — TRAZODONE HCL 50 MG PO TABS
25.0000 mg | ORAL_TABLET | Freq: Every evening | ORAL | Status: DC | PRN
  Administered 2022-10-29: 25 mg via ORAL
  Filled 2022-10-29: qty 1

## 2022-10-29 MED ORDER — UMECLIDINIUM BROMIDE 62.5 MCG/ACT IN AEPB
1.0000 | INHALATION_SPRAY | Freq: Every day | RESPIRATORY_TRACT | Status: DC
  Administered 2022-10-31: 1 via RESPIRATORY_TRACT
  Filled 2022-10-29: qty 7

## 2022-10-29 NOTE — H&P (Signed)
    Mr. Salzwedel presented for right lower extremity revascularization.  He has been complaining of generalized rash with erythema and has been treated with Benadryl initially was transitioned from Plavix then to Brilinta and then to Effient after having shortness of breath.  He is short of breath on presentation today and has swelling of the right lower extremity.  He has been placed on 2 L nasal cannula.  I evaluated him he does not appear suitable for bypass at this time his wounds are all stable in the right ankle and stable callus on the right foot.  Plan will be to discuss possible admission to the hospital and will postpone surgery at this time.  Oriana Horiuchi C. Randie Heinz, MD [facility] Office: [phone] Pager: [phone]

## 2022-10-29 NOTE — Progress Notes (Signed)
Per, Dr. Randie Heinz, surgery canceled.  He is going to consult hospitalist for the patient to be admitted.  Will await admission orders.

## 2022-10-29 NOTE — Progress Notes (Signed)
Called main pharmacy three times for effient and colace that were due ordered at 0915. Medication not sent to unit.

## 2022-10-29 NOTE — Consult Note (Addendum)
Cardiology Consultation   Patient ID: Luke [name] MRN: [id number]; DOB: Nov 05, 1945  Admit date: 10/29/2022 Date of Consult: 10/29/2022  PCP:  [name], MD   [facility] Providers Cardiologist:  Donato Schultz, MD        Patient Profile:   Luke [name] is a 77 y.o. male with a hx of CAD, ICM with improved EF, CHB s/p CRT-D, HLD, IPF not on home O2 (followed by Dr. Marchelle Gearing), CKD stage III and PAD who is being seen 10/29/2022 for the evaluation of acute CHF at the request of Dr. Ophelia Charter.  History of Present Illness:   Luke [name] is a 77 year old male with past medical history of CAD, ICM with improved EF, CHB s/p CRT-D, HLD, IPF not on home O2 (followed by Dr. Marchelle Gearing), CKD stage III and PAD.  He is a [place] veteran.  Previous echocardiogram obtained in March 2014 showed EF 30 to 35%.  This improved to 55 to 60% by March 2019.  Last echocardiogram obtained in March 2023 showed EF 40 to 45%.  Patient had DES to LAD on 09/18/2022, cardiac catheterization at that time revealed a CTO of RCA with left-to-right collaterals.  EF was 55 to 65% by LV gram.  Since her stenting, patient was seen in the ED on 6/1 for myalgia and again on 6/3 for drug induced skin rash.  Plavix was switched to Brilinta on 6/3 due to concern of rash.  She returned to the hospital on 10/10/2022 and underwent CTO PCI of RCA. Cardiac catheterization revealed 99% distal RCA treated with Synergy XD 2.5 x 24 mm DES, 75% mid RCA treated with Synergy XD 3.0 x 16 mm DES, 75% proximal RCA treated with Megatron 3.5 x 28 mm DES. EF 55 to 65%.  Postprocedure, patient was left on aspirin and Brilinta.  She was seen by her PCP on 10/17/2022 for rash.  She was given a prescription for Augmentin, the case was discussed with cardiology service, Brilinta was switched to Plavix with a loading dose.  According to PCPs note, his rash predates Brilinta.  He was seen by Gillian Shields, NP on 10/22/2022 at which time he continued to  have worsening rash, he attempted to switch from Plavix to Brilinta, however had recurrent shortness of breath, therefore Plavix was switched to Effient.  He was also concerned Crestor was contributing to the rash therefore Crestor switched to simvastatin.  He has a prior history of intolerance of Lipitor.  He was given cetirizine for itching and was advised to use OTC cortisone ointment.  Since switching away from Crestor, his rash has been slowly improving.  He continues to have generalized rash throughout the entire body.  For the past 2 weeks, he has been having right lower extremity redness and weeping.  He has also been having increasing dyspnea on exertion.  He has never required oxygen before even with prior history of interstitial lung disease.  He presented for planned right lower extremity vascular surgery.  O2 saturation was 88% on room air.  His procedure was canceled and patient was being admitted by hospitalist service.  Cardiology service was consulted for heart failure.  His BNP today is 174.  Troponin 64.  He denies any recent chest pain.      Past Medical History:  Diagnosis Date   AAA (abdominal aortic aneurysm) (HCC) 2011   Per vascular surgery   Atrial fibrillation (HCC)    CAD (coronary artery disease)    Presumed CAD  with nuclear scan October 09, 2011,  large anteroseptal MI and inferior MI. Catheterization scheduled October 15, 2011   Cardiomyopathy [facility])    Nuclear, October 09, 2011, EF 30%, multiple focal wall motion abnormalities   Chronic kidney disease    CKD3   Diabetes mellitus    type II   GERD (gastroesophageal reflux disease)    Silent   HLD (hyperlipidemia)    Hypertension    white coat HTN-- often elevated in office and controlled on outside checks.   ICD (implantable cardioverter-defibrillator) in place    CRT-D placed March, 2014 complete heart block and k dysfunction   IPF (idiopathic pulmonary fibrosis) (HCC)    LBBB (left bundle branch block)    LBBB on EKG  October 11, 2011,  no prior EKG has been done   Low testosterone    Hx of   Pacemaker    PAD (peripheral artery disease) (HCC)    Pancreatitis    Umbilical hernia     Past Surgical History:  Procedure Laterality Date   ABDOMINAL AORTAGRAM N/A 10/28/2011   Procedure: ABDOMINAL Ronny Flurry;  Surgeon: [name], MD;  Location: [facility] CATH LAB;  Service: Cardiovascular;  Laterality: N/A;   ABDOMINAL AORTIC ANEURYSM REPAIR     EVAR    ABDOMINAL AORTOGRAM N/A 05/27/2022   Procedure: ABDOMINAL AORTOGRAM;  Surgeon: Maeola Harman, MD;  Location: [facility] INVASIVE CV LAB;  Service: Cardiovascular;  Laterality: N/A;   BI-VENTRICULAR IMPLANTABLE CARDIOVERTER DEFIBRILLATOR N/A 07/16/2012   Procedure: BI-VENTRICULAR IMPLANTABLE CARDIOVERTER DEFIBRILLATOR  (CRT-D);  Surgeon: Duke Salvia, MD;  Location: [facility] CATH LAB;  Service: Cardiovascular;  Laterality: N/A;   BIV ICD GENERATOR CHANGEOUT N/A 10/25/2020   Procedure: BIV ICD GENERATOR CHANGEOUT;  Surgeon: Duke Salvia, MD;  Location: [facility] INVASIVE CV LAB;  Service: Cardiovascular;  Laterality: N/A;   CARDIAC CATHETERIZATION     CATARACT EXTRACTION Left 2023   COLONOSCOPY  03/23/2018; 2020   CORONARY CTO INTERVENTION N/A 10/10/2022   Procedure: CORONARY CTO INTERVENTION;  Surgeon: Corky Crafts, MD;  Location: [facility] INVASIVE CV LAB;  Service: Cardiovascular;  Laterality: N/A;   CORONARY LITHOTRIPSY N/A 09/18/2022   Procedure: CORONARY LITHOTRIPSY;  Surgeon: Corky Crafts, MD;  Location: [facility] INVASIVE CV LAB;  Service: Cardiovascular;  Laterality: N/A;   CORONARY STENT INTERVENTION N/A 09/18/2022   Procedure: CORONARY STENT INTERVENTION;  Surgeon: Corky Crafts, MD;  Location: [facility] INVASIVE CV LAB;  Service: Cardiovascular;  Laterality: N/A;   CORONARY ULTRASOUND/IVUS N/A 09/18/2022   Procedure: Coronary Ultrasound/IVUS;  Surgeon: Corky Crafts, MD;  Location: [facility] INVASIVE CV LAB;  Service: Cardiovascular;  Laterality: N/A;    ESOPHAGOGASTRODUODENOSCOPY N/A 02/12/2019   Procedure: ESOPHAGOGASTRODUODENOSCOPY (EGD);  Surgeon: Lynann Bologna, MD;  Location: [facility] ENDOSCOPY;  Service: Endoscopy;  Laterality: N/A;   ESOPHAGOGASTRODUODENOSCOPY (EGD) WITH PROPOFOL N/A 02/05/2018   Procedure: ESOPHAGOGASTRODUODENOSCOPY (EGD) WITH PROPOFOL;  Surgeon: Napoleon Form, MD;  Location: [facility] ENDOSCOPY;  Service: Endoscopy;  Laterality: N/A;   ESOPHAGOGASTRODUODENOSCOPY (EGD) WITH PROPOFOL N/A 09/27/2018   Procedure: ESOPHAGOGASTRODUODENOSCOPY (EGD) WITH PROPOFOL;  Surgeon: Jeani Hawking, MD;  Location: [facility] ENDOSCOPY;  Service: Endoscopy;  Laterality: N/A;   FOREIGN BODY REMOVAL  09/27/2018   Procedure: FOREIGN BODY REMOVAL;  Surgeon: Jeani Hawking, MD;  Location[facility] ENDOSCOPY;  Service: Endoscopy;;   FOREIGN BODY REMOVAL  02/12/2019   Procedure: FOREIGN BODY REMOVAL;  Surgeon: Lynann Bologna, MD;  Location: [facility] ENDOSCOPY;  Service: Endoscopy;;   HERNIA REPAIR  05/14/2013   INSERTION OF  MESH N/A 05/14/2013   Procedure: INSERTION OF MESH;  Surgeon: Ardeth Sportsman, MD;  Location: [facility] OR;  Service: General;  Laterality: N/A;   LEFT HEART CATH N/A 10/10/2022   Procedure: Left Heart Cath;  Surgeon: Corky Crafts, MD;  Location: [facility] INVASIVE CV LAB;  Service: Cardiovascular;  Laterality: N/A;   LEFT HEART CATH AND CORONARY ANGIOGRAPHY N/A 07/17/2021   Procedure: LEFT HEART CATH AND CORONARY ANGIOGRAPHY;  Surgeon: Lennette Bihari, MD;  Location: [facility] INVASIVE CV LAB;  Service: Cardiovascular;  Laterality: N/A;   LEFT HEART CATH AND CORONARY ANGIOGRAPHY N/A 09/18/2022   Procedure: LEFT HEART CATH AND CORONARY ANGIOGRAPHY;  Surgeon: Corky Crafts, MD;  Location: [facility] INVASIVE CV LAB;  Service: Cardiovascular;  Laterality: N/A;   LOWER EXTREMITY ANGIOGRAM Bilateral 10/28/2011   Procedure: LOWER EXTREMITY ANGIOGRAM;  Surgeon: [name], MD;  Location: [facility] CATH LAB;  Service: Cardiovascular;  Laterality: Bilateral;   LOWER EXTREMITY  ANGIOGRAPHY  05/27/2022   Procedure: Lower Extremity Angiography;  Surgeon: Maeola Harman, MD;  Location: [facility] INVASIVE CV LAB;  Service: Cardiovascular;;   PACEMAKER INSERTION  07/16/2012   pacemaker/defibrilator   POLYPECTOMY     POSTERIOR CERVICAL FUSION/FORAMINOTOMY N/A 06/09/2014   Procedure: Laminectomy - Cervical two-Cervcial four posterior cervical instrumented fusion Cervical two-cervical four;  Surgeon: Tia Alert, MD;  Location: [facility] NEURO ORS;  Service: Neurosurgery;  Laterality: N/A;  posterior    SAVORY DILATION N/A 02/05/2018   Procedure: SAVORY DILATION;  Surgeon: Napoleon Form, MD;  Location: [facility] ENDOSCOPY;  Service: Endoscopy;  Laterality: N/A;   TONSILLECTOMY     as a child    UMBILICAL HERNIA REPAIR N/A 05/14/2013   Procedure: LAPAROSCOPIC exploration and repair of hernia in abdominal ;  Surgeon: Ardeth Sportsman, MD;  Location: [facility] OR;  Service: General;  Laterality: N/A;   UPPER GASTROINTESTINAL ENDOSCOPY  2020     Home Medications:  Prior to Admission medications   Medication Sig Start Date End Date Taking? Authorizing Provider  amLODipine (NORVASC) 10 MG tablet TAKE 1 TABLET(10 MG) BY MOUTH DAILY 11/20/21  Yes Jake Bathe, MD  aspirin EC 81 MG tablet Take 1 tablet (81 mg total) by mouth daily. 09/18/22  Yes Yvonna Alanis L, PA-C  carvedilol (COREG) 25 MG tablet TAKE 1 TABLET BY MOUTH TWICE DAILY WITH MEALS 12/17/21  Yes Jake Bathe, MD  cetirizine (ZYRTEC) 10 MG tablet Take 1 tablet (10 mg total) by mouth daily. 10/22/22  Yes Alver Sorrow, NP  Cholecalciferol (VITAMIN D3) 50 MCG (2000 UT) TABS Take 2,000 Units by mouth in the morning.   Yes [provider]  Cyanocobalamin (VITAMIN B-12 PO) Take 2,000 mcg by mouth in the morning.   Yes [provider]  glipiZIDE (GLUCOTROL) 5 MG tablet Take 1 tablet (5 mg total) by mouth 2 (two) times daily before a meal. 10/17/22  Yes [name], MD  insulin glargine (LANTUS SOLOSTAR) 100  UNIT/ML Solostar Pen INJECT 0.3 TO 0.35 MLS(30 TO 35 UNITS) INTO THE SKIN EVERY DAY 03/29/20  Yes [name], MD  isosorbide mononitrate (IMDUR) 30 MG 24 hr tablet Take 1 tablet (30 mg total) by mouth daily. 04/16/22  Yes [name], MD  metFORMIN (GLUCOPHAGE) 500 MG tablet TAKE 2 TABLETS BY MOUTH TWICE DAILY WITH FOOD 03/04/22  Yes [name], MD  Multiple Vitamin (MULTIVITAMIN WITH MINERALS) TABS tablet Take 1 tablet by mouth in the morning.   Yes [provider]  pantoprazole (PROTONIX) 40 MG tablet Take 2 tablets (80 mg total) by mouth daily. 09/18/22  Yes Yvonna Alanis L, PA-C  prasugrel (EFFIENT) 10 MG TABS tablet Take 1 tablet (10 mg total) by mouth daily. 10/23/22  Yes Alver Sorrow, NP  simvastatin (ZOCOR) 20 MG tablet Take 1 tablet (20 mg total) by mouth at bedtime. 10/22/22 01/20/23 Yes Alver Sorrow, NP  umeclidinium bromide (INCRUSE ELLIPTA) 62.5 MCG/ACT AEPB Inhale 1 puff into the lungs daily. 09/06/22  Yes Kalman Shan, MD  valsartan (DIOVAN) 40 MG tablet Take 20 mg by mouth daily with supper.   Yes [provider]  albuterol (VENTOLIN HFA) 108 (90 Base) MCG/ACT inhaler Inhale 2 puffs into the lungs every 6 (six) hours as needed for wheezing or shortness of breath. 09/03/22   Kalman Shan, MD  ESBRIET 267 MG TABS Take 2 tablets (534 mg total) by mouth in the morning, at noon, and at bedtime. **NOTE LOW DOSE AS MAINTENANCE** 06/26/22   Kalman Shan, MD  glucose blood (ACCU-CHEK AVIVA PLUS) test strip AS DIRECTED TO CHECK BLOOD SUGAR TWICE DAILY 04/22/22   [name], MD    Inpatient Medications: Scheduled Meds:  albuterol  2.5 mg Nebulization Q6H   [START ON 10/30/2022] aspirin EC  81 mg Oral Daily   carvedilol  25 mg Oral BID WC   docusate sodium  100 mg Oral BID   enoxaparin (LOVENOX) injection  40 mg Subcutaneous Q24H   furosemide  40 mg Intravenous BID   insulin aspart  0-15 Units Subcutaneous TID WC   insulin aspart  0-5  Units Subcutaneous QHS   insulin glargine-yfgn  30 Units Subcutaneous Daily   isosorbide mononitrate  30 mg Oral Daily   [START ON 10/30/2022] loratadine  10 mg Oral Daily   pantoprazole  80 mg Oral Daily   prasugrel  10 mg Oral Daily   simvastatin  20 mg Oral QHS   sodium chloride flush  3 mL Intravenous Q12H   [START ON 10/30/2022] umeclidinium bromide  1 puff Inhalation Daily   Continuous Infusions:  PRN Meds: acetaminophen **OR** acetaminophen, bisacodyl, hydrALAZINE, insulin aspart, ondansetron **OR** ondansetron (ZOFRAN) IV, polyethylene glycol, traZODone  Allergies:    Allergies  Allergen Reactions   Aldactone [Spironolactone] Other (See Comments)    Hyperkalemia   Brilinta [Ticagrelor] Shortness Of Breath    Numbness in hands and feet Blurry vision   Clopidogrel Rash    Redness and Itchiness   Codeine Rash and Hives   Amoxicillin Hives   Atorvastatin     Myalgias with lipitor.  Does tolerate simvastatin.     Januvia [Sitagliptin] Other (See Comments)    Diarrhea and heart racing   Jardiance [Empagliflozin] Other (See Comments)    Polyuria; excessive weight loss   Lisinopril     Possible cause of pancreatitis   Rosuvastatin Other (See Comments)    myalgia    Social History:   Social History   Socioeconomic History   Marital status: Married    Spouse name: Patti   Number of children: 1   Years of education: Not on file   Highest education level: Associate degree: academic program  Occupational History   Occupation: retired  Tobacco Use   Smoking status: Former    Packs/day: 2.00    Years: 40.00    Additional pack years: 0.00    Total pack years: 80.00    Types: Cigarettes    Start date: 1964  Quit date: 05/06/2004    Years since quitting: 18.4    Passive exposure: Never   Smokeless tobacco: Never  Vaping Use   Vaping Use: Never used  Substance and Sexual Activity   Alcohol use: Not Currently   Drug use: No   Sexual activity: Yes    Partners:  Female  Other Topics Concern   Not on file  Social History Narrative   [place] vet, he has known service related agent orange exposure.    Retired   Community education officer daily.     Social Determinants of Health   Financial Resource Strain: Low Risk  (10/16/2022)   Overall Financial Resource Strain (CARDIA)    Difficulty of Paying Living Expenses: Not hard at all  Food Insecurity: No Food Insecurity (10/16/2022)   Hunger Vital Sign    Worried About Running Out of Food in the Last Year: Never true    Ran Out of Food in the Last Year: Never true  Transportation Needs: No Transportation Needs (10/16/2022)   PRAPARE - Administrator, Civil Service (Medical): No    Lack of Transportation (Non-Medical): No  Physical Activity: Sufficiently Active (10/16/2022)   Exercise Vital Sign    Days of Exercise per Week: 6 days    Minutes of Exercise per Session: 50 min  Stress: No Stress Concern Present (10/16/2022)   [employer] of Occupational Health - Occupational Stress Questionnaire    Feeling of Stress : Not at all  Social Connections: Moderately Integrated (10/16/2022)   Social Connection and Isolation Panel [NHANES]    Frequency of Communication with Friends and Family: Three times a week    Frequency of Social Gatherings with Friends and Family: Once a week    Attends Religious Services: More than 4 times per year    Active Member of [employer] or Organizations: No    Attends Banker Meetings: Never    Marital Status: Married  Catering manager Violence: Not At Risk (10/10/2022)   Humiliation, Afraid, Rape, and Kick questionnaire    Fear of Current or Ex-Partner: No    Emotionally Abused: No    Physically Abused: No    Sexually Abused: No    Family History:    Family History  Problem Relation Age of Onset   Hypertension Mother    Stroke Mother    Hyperlipidemia Mother    Lung cancer Father    Diabetes Sister    Heart disease Sister        Before age 21   Hypertension  Sister    Hyperlipidemia Sister    Heart attack Sister    Hypertension Son    Colon cancer Neg Hx    Prostate cancer Neg Hx    Esophageal cancer Neg Hx    Stomach cancer Neg Hx    Rectal cancer Neg Hx    Colon polyps Neg Hx    Pancreatic cancer Neg Hx      ROS:  Please see the history of present illness.   All other ROS reviewed and negative.     Physical Exam/Data:   Vitals:   10/29/22 0552 10/29/22 0619  BP: (!) 173/69   Pulse: 83   Resp: 20   SpO2: 90% 91%  Weight: 82.6 kg   Height: 6\' 2"  (1.88 m)     Intake/Output Summary (Last 24 hours) at 10/29/2022 1326 Last data filed at 10/29/2022 1100 Gross per 24 hour  Intake 240 ml  Output 200 ml  Net 40  ml      10/29/2022    5:52 AM 10/23/2022   12:57 PM 10/22/2022    9:58 AM  Last 3 Weights  Weight (lbs) 182 lb 189 lb 9.6 oz 188 lb  Weight (kg) 82.555 kg 86.002 kg 85.276 kg     Body mass index is 23.37 kg/m.  General:  Well nourished, well developed, in no acute distress HEENT: normal Neck: no JVD Vascular: No carotid bruits; Distal pulses 2+ bilaterally Cardiac:  normal S1, S2; RRR; no murmur  Lungs:  clear to auscultation bilaterally, no wheezing, rhonchi or rales  Abd: soft, nontender, no hepatomegaly  Ext: 2+ edema, RLE red and weeping.  Musculoskeletal:  No deformities, BUE and BLE strength normal and equal Skin: Diffuse eczematous rash and flaky skin. RLE red and weeping Neuro:  CNs 2-12 intact, no focal abnormalities noted Psych:  Normal affect        Note, photograph was obtained with the patient's permission to better document his lower extremity rash and weeping leg.  EKG:  The EKG was personally reviewed and demonstrates:  pending repeat Telemetry:  Telemetry was personally reviewed and demonstrates:  Not on telemetry  Relevant CV Studies:  Echo 07/17/2021  1. Left ventricular ejection fraction, by estimation, is 40 to 45%. The  left ventricle has mildly decreased function. The left  ventricle  demonstrates regional wall motion abnormalities (see scoring  diagram/findings for description). Left ventricular  diastolic parameters are consistent with Grade I diastolic dysfunction  (impaired relaxation).   2. Right ventricular systolic function is normal. The right ventricular  size is mildly enlarged. There is normal pulmonary artery systolic  pressure.   3. The mitral valve is normal in structure. Trivial mitral valve  regurgitation.   4. The aortic valve is tricuspid. There is mild calcification of the  aortic valve. There is mild thickening of the aortic valve. Aortic valve  regurgitation is not visualized. Aortic valve sclerosis is present, with  no evidence of aortic valve stenosis.   Comparison(s): Prior images unable to be directly viewed, comparison made  by report only. The left ventricular function is worsened. The left  ventricular wall motion abnormalities are new.    Cath 10/10/2022   Dist RCA lesion is 99% stenosed, functionally a chronic total occlusion.  A drug-eluting stent was successfully placed using a SYNERGY XD 2.50X24, postdilated to 2.9 mm and optimized with intravascular ultrasound.   Post intervention, there is a 0% residual stenosis.   Mid RCA lesion is 75% stenosed.  A drug-eluting stent was successfully placed using a SYNERGY XD 3.0X16, postdilated to 3.5 mm and optimized with intravascular ultrasound.   Post intervention, there is a 0% residual stenosis.   Prox RCA lesion is 75% stenosed.  Severely calcified.  After rotational atherectomy, A drug-eluting stent was successfully placed using a STENT MEGATRON 3.5X28, postdilated to > 3.5 mm and optimized with intravascular ultrasound.   Prox RCA to Mid RCA lesion is 25% stenosed with 25% stenosed side branch in RV Branch.   Post intervention, there is a 0% residual stenosis.   The left ventricular systolic function is normal.   LV end diastolic pressure is normal.   The left ventricular  ejection fraction is 55-65% by visual estimate.   There is no aortic valve stenosis.   In the absence of any other complications or medical issues, we expect the patient to be ready for discharge from an interventional cardiology perspective on 10/11/2022.   Recommend uninterrupted dual antiplatelet  therapy with Aspirin 81mg  daily and Ticagrelor 90mg  twice daily for given diffuse disease, would plan longer P2Y12 therapy.   Successful CTO PCI of the RCA with stents placed in the distal, mid and proximal RCA.  The distal and mid stents overlap.   The patient will be watched overnight.  Results conveyed to his wife.   He continues to have a right foot wound with planned vascular surgery in a few weeks.  Will have wound care evaluate the patient in the hospital.   Would plan for prolonged more intense antiplatelet therapy given his diffuse CAD and multiple stents.    Laboratory Data:  High Sensitivity Troponin:   Recent Labs  Lab 10/29/22 0906  TROPONINIHS 64*     Chemistry Recent Labs  Lab 10/23/22 1340 10/29/22 0906  NA 136 141  K 4.7 4.4  CL 101 110  CO2 16* 19*  GLUCOSE 277* 107*  BUN 27* 24*  CREATININE 1.21 1.15  CALCIUM 9.3 9.2  GFRNONAA >60 >60  ANIONGAP 19* 12    Recent Labs  Lab 10/29/22 0906  PROT 6.6  ALBUMIN 3.5  AST 27  ALT 21  ALKPHOS 83  BILITOT 0.9   Lipids No results for input(s): "CHOL", "TRIG", "HDL", "LABVLDL", "LDLCALC", "CHOLHDL" in the last 168 hours.  Hematology Recent Labs  Lab 10/23/22 1340 10/29/22 0906  WBC 12.3* 10.1  RBC 4.14* 4.06*  HGB 13.6 13.3  HCT 39.9 40.0  MCV 96.4 98.5  MCH 32.9 32.8  MCHC 34.1 33.3  RDW 13.8 14.5  PLT 322 326   Thyroid No results for input(s): "TSH", "FREET4" in the last 168 hours.  BNP Recent Labs  Lab 10/29/22 0906  BNP 174.0*    DDimer No results for input(s): "DDIMER" in the last 168 hours.   Radiology/Studies:  DG Chest Port 1 View  Result Date: 10/29/2022 CLINICAL DATA:  Preoperative  for right common femoral endarterectomy. [id number]. EXAM: PORTABLE CHEST 1 VIEW COMPARISON:  High-resolution chest CT 03/27/2022, portable chest 08/20/2021 FINDINGS: Upper zonal centrilobular emphysematous changes and basal-predominant UIP-pattern fibrosis are again noted with underlying subpleural ground-glass disease in the lower lung fields. The appearance is similar to the prior studies but the ground-glass disease does limit assessment for early infiltrates. No dense area consolidation or new infiltrate is seen. The cardiac size is normal. A left chest dual lead pacing system with AID wiring is again shown. The mediastinum is normally outlined. There is calcification of the transverse aorta. Osteopenia, degenerative changes and mild levoscoliosis thoracic spine. IMPRESSION: 1. Background mid to lower zonal subpleural fibrosis and ground-glass disease. No new lung opacity. 2. COPD and basal-predominant UIP-pattern fibrosis. 3. Aortic atherosclerosis. Electronically Signed   By: Almira Bar M.D.   On: 10/29/2022 07:47     Assessment and Plan:   Acute on chronic diastolic heart failure: He also has a prior history of systolic heart failure dating back to 2014 with EF as low as 30 to 35%.  By 2019, EF has improved to 55 to 60% however dropped down to 40 to 45% by March 2023.  Most recent cardiac catheterization performed in May and again in June mentioned EF was normal at 55 to 65% by LV gram.  On exam, he has at least 2+ pitting edema bilaterally. Recent device interrogation of CRT-D showed decreased impedence suggestive of volume overload since around 6/9.  He has significant weeping in the right lower extremity.  BNP was borderline at 174 which is not very high given  the amount of weeping.  He has been started on 40 mg twice a day of IV Lasix.  Will follow-up with response  Generalized rash: Unclear cause.  He is confident that his rash was related to recent course of amoxicillin-clavulanate and Crestor.   Although previous records indicate he thought he had rash with Plavix, however he says it is more likely to be Crestor.  Either way, his Plavix has been switched to Effient.  Diffuse rashes improving.  Looks like eczema on exam with cracks in the skin and scaly/flaky skin.  Weeping R leg: likely related to #1. See above photo. Does not appears to be dermatitis.  He has finished 2 courses of antibiotic recently (cipro and Augmentin), therefore suspicion for infection fairly low. Felt rash worsened with Agumentin/amoxicillin.   CAD: Recently underwent LAD PCI in May 2024 and CTA PCI of RCA on 10/10/2022.  Continue aspirin and Effient.  Intolerant of Brilinta due to shortness of breath side effect.  Felt Plavix may be contributing to the rash. Denies any recent chest pain. Trop borderline elevated today, repeat EKG, obtain serial lab to make sure troponin does not go up.  Hyperlipidemia: Intolerant of Lipitor.  Felt Crestor may be contributing to rash, switch to Zocor.  Ischemic cardiomyopathy: EF 30 to 35% in 2014, improved to 55% in 2019, dropped down to 40 to 45% in March 2023.  Recent cardiac catheterization showed LV gram 55 to 65%, obtain echocardiogram  Complete heart block s/p [employer] CRT-D: Recently interrogated on 10/23/2022  Interstitial pulmonary fibrosis: Not on home O2.  Followed by Dr. Marchelle Gearing.  He has chronic bibasilar crackles on exam secondary to pulmonary fibrosis.  PAD: Significant lower extremity disease, initially planning for right lower extremity vascular intervention today, procedure was canceled due to heart failure and hypoxia.   Risk Assessment/Risk Scores:        New York Heart Association (NYHA) Functional Class NYHA Class II        For questions or updates, please contact  [facility] Please consult [email or web address] for contact info under    Signed, Azalee Course, [place]  10/29/2022 1:26 PM  I have seen and examined the patient along with Azalee Course, PA.  I have  reviewed the chart, notes and new data.  I agree with PA/NP's note.  Key new complaints: Multiple complex clinical events well summarized in the above note.  He has increased bilateral lower extremity edema compared to baseline, although he reports that his weight has not changed at 182 pounds.  Dyspnea slightly worse.  Has weeping only from the right lower extremity, which is also more erythematous, although not warm or clearly with cellulitis.  Widespread pruritic rash is improving after multiple changes in medications.  Has not had fever or chills. Key examination changes: There is bilateral lower extremity edema 3+ on the right, 2+ on the left, with weeping from the right lower extremity skin.  I do not see jugular venous distention.  The right lower extremity is also more erythematous, but it is not warm/hot.  He has bilateral dry sounding crackles in the bases of both lungs, but his apices are clear. Key new findings / data: Interrogation of his CRT-the device on 10/23/2022 shows that he had evidence of an thoracic impedance consistent with hypervolemia starting 10 days earlier.  His BNP on the other hand is only minimally abnormal at 174 (however this is much higher than previously obtained proBNP levels from May November 2023 in the 60s).  No evidence of lower extremity DVT on ultrasound performed earlier this month 601 2024.  PLAN: Although there is some conflicting evidence, the burden of data suggest that he is indeed hypervolemic and requires diuresis.  I do not have a good explanation for the asymmetric degree of swelling in his lower extremities or for the fact that only the right lower extremity is weeping.  He may have reactive hyperemia due to ischemia of the right lower extremity.  Left heart failure could explain his hypoxemia.   Echocardiogram is pending.  We can reinterrogate his ICD to get an update on his thoracic impedance. Agree with diuretics.  Monitor renal function  daily.   Thurmon Fair, MD, [facility] [phone] 10/29/2022, 3:50 PM

## 2022-10-29 NOTE — Progress Notes (Addendum)
Patient has redness and flaky skin over entire body.  His lower extremities are weeping as well. Did not shave patient.  Prep was not done over open weeping leg.    [employer] representative paged for the pacer.    [address] [employer] representative here and Development worker, community.

## 2022-10-29 NOTE — Progress Notes (Signed)
Called report to Marylene Land, RN on 4E. Verbalized understanding. Patient transported to 4E room 4 in safe and stable condition.

## 2022-10-29 NOTE — H&P (Signed)
History and Physical    Patient: Luke [name] NFA:[id number] DOB: 1945/11/10 DOA: 10/29/2022 DOS: the patient was seen and examined on 10/29/2022 PCP: [name], MD  Patient coming from: Home - lives with wife; NOK: Wife, Virginia Francisco, [phone]   Chief Complaint: SOB  HPI: Luke [name] is a 77 y.o. male with medical history significant of AAA, afib, chronic systolic CHF with AICD, stage 3 CKD, DM, HTN, HLD, IPF not on home O2, and PAD presenting for RLE revascularization.    He underwent aortogram and RLE angiography in 05/2022 and was noted to have significant disease, was planned for femoral endarterectomy and bypass.  In May, he had anginal symptoms and had a planned cardiac cath on 5/15 with stent placement in the LAD and chronic total occlusion of RCA was noted "with new channel which may facilitate CTO PCI."  He was placed on Plavix but developed a rash.  He was admitted again on 6/6 for angina and underwent stent placement in the RCA, placed on ASA and Brilinta.  He is pretty sure that the Brilinta caused him to have SOB and he was changed back to Plavix, but the rash recurred.  He was changed to Effient and has tolerated this.  Meanwhile, he was given Augmentin on 6/13 for his RLE ischemia and developed recurrent rash which is subsequently improving off the medication.  He has noticed SOB for the last few days and came in for fem-pop today but was too SOB with hypoxia to proceed.    ER Course:  Planned for revascularization in January.  Ended up needing coronary stents a month ago.  Here 88% with total body rash - changing Plavix -> Brilinta -> Effient.  CXR with concern for volume overload.  Also with rash.  Not appropriate for surgery today.     Review of Systems: As mentioned in the history of present illness. All other systems reviewed and are negative. Past Medical History:  Diagnosis Date   AAA (abdominal aortic aneurysm) (HCC) 2011   Per vascular surgery    Atrial fibrillation (HCC)    CAD (coronary artery disease)    Presumed CAD with nuclear scan October 09, 2011,  large anteroseptal MI and inferior MI. Catheterization scheduled October 15, 2011   Cardiomyopathy [facility])    Nuclear, October 09, 2011, EF 30%, multiple focal wall motion abnormalities   Chronic kidney disease    CKD3   Diabetes mellitus    type II   GERD (gastroesophageal reflux disease)    Silent   HLD (hyperlipidemia)    Hypertension    white coat HTN-- often elevated in office and controlled on outside checks.   ICD (implantable cardioverter-defibrillator) in place    CRT-D placed March, 2014 complete heart block and k dysfunction   IPF (idiopathic pulmonary fibrosis) (HCC)    LBBB (left bundle branch block)    LBBB on EKG October 11, 2011,  no prior EKG has been done   Low testosterone    Hx of   Pacemaker    PAD (peripheral artery disease) (HCC)    Pancreatitis    Umbilical hernia    Past Surgical History:  Procedure Laterality Date   ABDOMINAL AORTAGRAM N/A 10/28/2011   Procedure: ABDOMINAL Ronny Flurry;  Surgeon: [name], MD;  Location[facility] CATH LAB;  Service: Cardiovascular;  Laterality: N/A;   ABDOMINAL AORTIC ANEURYSM REPAIR     EVAR    ABDOMINAL AORTOGRAM N/A 05/27/2022   Procedure: ABDOMINAL AORTOGRAM;  Surgeon: Maeola Harman, MD;  Location: [facility] INVASIVE CV LAB;  Service: Cardiovascular;  Laterality: N/A;   BI-VENTRICULAR IMPLANTABLE CARDIOVERTER DEFIBRILLATOR N/A 07/16/2012   Procedure: BI-VENTRICULAR IMPLANTABLE CARDIOVERTER DEFIBRILLATOR  (CRT-D);  Surgeon: Duke Salvia, MD;  Location: [facility] CATH LAB;  Service: Cardiovascular;  Laterality: N/A;   BIV ICD GENERATOR CHANGEOUT N/A 10/25/2020   Procedure: BIV ICD GENERATOR CHANGEOUT;  Surgeon: Duke Salvia, MD;  Location: [facility] INVASIVE CV LAB;  Service: Cardiovascular;  Laterality: N/A;   CARDIAC CATHETERIZATION     CATARACT EXTRACTION Left 2023   COLONOSCOPY  03/23/2018; 2020   CORONARY CTO  INTERVENTION N/A 10/10/2022   Procedure: CORONARY CTO INTERVENTION;  Surgeon: Corky Crafts, MD;  Location: [facility] INVASIVE CV LAB;  Service: Cardiovascular;  Laterality: N/A;   CORONARY LITHOTRIPSY N/A 09/18/2022   Procedure: CORONARY LITHOTRIPSY;  Surgeon: Corky Crafts, MD;  Location: [facility] INVASIVE CV LAB;  Service: Cardiovascular;  Laterality: N/A;   CORONARY STENT INTERVENTION N/A 09/18/2022   Procedure: CORONARY STENT INTERVENTION;  Surgeon: Corky Crafts, MD;  Location: [facility] INVASIVE CV LAB;  Service: Cardiovascular;  Laterality: N/A;   CORONARY ULTRASOUND/IVUS N/A 09/18/2022   Procedure: Coronary Ultrasound/IVUS;  Surgeon: Corky Crafts, MD;  Location: [facility] INVASIVE CV LAB;  Service: Cardiovascular;  Laterality: N/A;   ESOPHAGOGASTRODUODENOSCOPY N/A 02/12/2019   Procedure: ESOPHAGOGASTRODUODENOSCOPY (EGD);  Surgeon: Lynann Bologna, MD;  Location: [facility] ENDOSCOPY;  Service: Endoscopy;  Laterality: N/A;   ESOPHAGOGASTRODUODENOSCOPY (EGD) WITH PROPOFOL N/A 02/05/2018   Procedure: ESOPHAGOGASTRODUODENOSCOPY (EGD) WITH PROPOFOL;  Surgeon: Napoleon Form, MD;  Location: [facility] ENDOSCOPY;  Service: Endoscopy;  Laterality: N/A;   ESOPHAGOGASTRODUODENOSCOPY (EGD) WITH PROPOFOL N/A 09/27/2018   Procedure: ESOPHAGOGASTRODUODENOSCOPY (EGD) WITH PROPOFOL;  Surgeon: Jeani Hawking, MD;  Location: [facility] ENDOSCOPY;  Service: Endoscopy;  Laterality: N/A;   FOREIGN BODY REMOVAL  09/27/2018   Procedure: FOREIGN BODY REMOVAL;  Surgeon: Jeani Hawking, MD;  Location: [facility] ENDOSCOPY;  Service: Endoscopy;;   FOREIGN BODY REMOVAL  02/12/2019   Procedure: FOREIGN BODY REMOVAL;  Surgeon: Lynann Bologna, MD;  Location: [facility] ENDOSCOPY;  Service: Endoscopy;;   HERNIA REPAIR  05/14/2013   INSERTION OF MESH N/A 05/14/2013   Procedure: INSERTION OF MESH;  Surgeon: Ardeth Sportsman, MD;  Location: [facility] OR;  Service: General;  Laterality: N/A;   LEFT HEART CATH N/A 10/10/2022   Procedure: Left Heart Cath;  Surgeon: Corky Crafts, MD;  Location: [facility] INVASIVE CV LAB;  Service: Cardiovascular;  Laterality: N/A;   LEFT HEART CATH AND CORONARY ANGIOGRAPHY N/A 07/17/2021   Procedure: LEFT HEART CATH AND CORONARY ANGIOGRAPHY;  Surgeon: Lennette Bihari, MD;  Location: [facility] INVASIVE CV LAB;  Service: Cardiovascular;  Laterality: N/A;   LEFT HEART CATH AND CORONARY ANGIOGRAPHY N/A 09/18/2022   Procedure: LEFT HEART CATH AND CORONARY ANGIOGRAPHY;  Surgeon: Corky Crafts, MD;  Location: [facility] INVASIVE CV LAB;  Service: Cardiovascular;  Laterality: N/A;   LOWER EXTREMITY ANGIOGRAM Bilateral 10/28/2011   Procedure: LOWER EXTREMITY ANGIOGRAM;  Surgeon: [name], MD;  Location: [facility] CATH LAB;  Service: Cardiovascular;  Laterality: Bilateral;   LOWER EXTREMITY ANGIOGRAPHY  05/27/2022   Procedure: Lower Extremity Angiography;  Surgeon: Maeola Harman, MD;  Location: [facility] INVASIVE CV LAB;  Service: Cardiovascular;;   PACEMAKER INSERTION  07/16/2012   pacemaker/defibrilator   POLYPECTOMY     POSTERIOR CERVICAL FUSION/FORAMINOTOMY N/A 06/09/2014   Procedure: Laminectomy - Cervical two-Cervcial four posterior cervical instrumented fusion Cervical two-cervical four;  Surgeon: Tia Alert, MD;  Location: [facility] NEURO ORS;  Service: Neurosurgery;  Laterality: N/A;  posterior    SAVORY DILATION N/A 02/05/2018   Procedure: SAVORY DILATION;  Surgeon: Napoleon Form, MD;  Location: [facility] ENDOSCOPY;  Service: Endoscopy;  Laterality: N/A;   TONSILLECTOMY     as a child    UMBILICAL HERNIA REPAIR N/A 05/14/2013   Procedure: LAPAROSCOPIC exploration and repair of hernia in abdominal ;  Surgeon: Ardeth Sportsman, MD;  Location: [facility] OR;  Service: General;  Laterality: N/A;   UPPER GASTROINTESTINAL ENDOSCOPY  2020   Social History:  reports that he quit smoking about 18 years ago. His smoking use included cigarettes. He started smoking about 60 years ago. He has a 80.00 pack-year smoking history. He has never been exposed to  tobacco smoke. He has never used smokeless tobacco. He reports that he does not currently use alcohol. He reports that he does not use drugs.  Allergies  Allergen Reactions   Aldactone [Spironolactone] Other (See Comments)    Hyperkalemia   Brilinta [Ticagrelor] Shortness Of Breath    Numbness in hands and feet Blurry vision   Clopidogrel Rash    Redness and Itchiness   Codeine Rash and Hives   Amoxicillin Hives   Atorvastatin     Myalgias with lipitor.  Does tolerate simvastatin.     Januvia [Sitagliptin] Other (See Comments)    Diarrhea and heart racing   Jardiance [Empagliflozin] Other (See Comments)    Polyuria; excessive weight loss   Lisinopril     Possible cause of pancreatitis   Rosuvastatin Other (See Comments)    myalgia    Family History  Problem Relation Age of Onset   Hypertension Mother    Stroke Mother    Hyperlipidemia Mother    Lung cancer Father    Diabetes Sister    Heart disease Sister        Before age 107   Hypertension Sister    Hyperlipidemia Sister    Heart attack Sister    Hypertension Son    Colon cancer Neg Hx    Prostate cancer Neg Hx    Esophageal cancer Neg Hx    Stomach cancer Neg Hx    Rectal cancer Neg Hx    Colon polyps Neg Hx    Pancreatic cancer Neg Hx     Prior to Admission medications   Medication Sig Start Date End Date Taking? Authorizing Provider  amLODipine (NORVASC) 10 MG tablet TAKE 1 TABLET(10 MG) BY MOUTH DAILY 11/20/21  Yes Jake Bathe, MD  aspirin EC 81 MG tablet Take 1 tablet (81 mg total) by mouth daily. 09/18/22  Yes Yvonna Alanis L, PA-C  carvedilol (COREG) 25 MG tablet TAKE 1 TABLET BY MOUTH TWICE DAILY WITH MEALS 12/17/21  Yes Jake Bathe, MD  cetirizine (ZYRTEC) 10 MG tablet Take 1 tablet (10 mg total) by mouth daily. 10/22/22  Yes Alver Sorrow, NP  Cholecalciferol (VITAMIN D3) 50 MCG (2000 UT) TABS Take 2,000 Units by mouth in the morning.   Yes [provider]  Cyanocobalamin (VITAMIN B-12  PO) Take 2,000 mcg by mouth in the morning.   Yes [provider]  glipiZIDE (GLUCOTROL) 5 MG tablet Take 1 tablet (5 mg total) by mouth 2 (two) times daily before a meal. 10/17/22  Yes [name], MD  insulin glargine (LANTUS SOLOSTAR) 100 UNIT/ML Solostar Pen INJECT 0.3 TO 0.35 MLS(30 TO 35 UNITS) INTO THE SKIN EVERY DAY 03/29/20  Yes Crawford Givens  S, MD  isosorbide mononitrate (IMDUR) 30 MG 24 hr tablet Take 1 tablet (30 mg total) by mouth daily. 04/16/22  Yes [name], MD  metFORMIN (GLUCOPHAGE) 500 MG tablet TAKE 2 TABLETS BY MOUTH TWICE DAILY WITH FOOD 03/04/22  Yes [name], MD  Multiple Vitamin (MULTIVITAMIN WITH MINERALS) TABS tablet Take 1 tablet by mouth in the morning.   Yes [provider]  pantoprazole (PROTONIX) 40 MG tablet Take 2 tablets (80 mg total) by mouth daily. 09/18/22  Yes Yvonna Alanis L, PA-C  prasugrel (EFFIENT) 10 MG TABS tablet Take 1 tablet (10 mg total) by mouth daily. 10/23/22  Yes Alver Sorrow, NP  simvastatin (ZOCOR) 20 MG tablet Take 1 tablet (20 mg total) by mouth at bedtime. 10/22/22 01/20/23 Yes Alver Sorrow, NP  umeclidinium bromide (INCRUSE ELLIPTA) 62.5 MCG/ACT AEPB Inhale 1 puff into the lungs daily. 09/06/22  Yes Kalman Shan, MD  valsartan (DIOVAN) 40 MG tablet Take 20 mg by mouth daily with supper.   Yes [provider]  albuterol (VENTOLIN HFA) 108 (90 Base) MCG/ACT inhaler Inhale 2 puffs into the lungs every 6 (six) hours as needed for wheezing or shortness of breath. 09/03/22   Kalman Shan, MD  ESBRIET 267 MG TABS Take 2 tablets (534 mg total) by mouth in the morning, at noon, and at bedtime. **NOTE LOW DOSE AS MAINTENANCE** 06/26/22   Kalman Shan, MD  glucose blood (ACCU-CHEK AVIVA PLUS) test strip AS DIRECTED TO CHECK BLOOD SUGAR TWICE DAILY 04/22/22   [name], MD    Physical Exam: Vitals:   10/29/22 0619 10/29/22 1436 10/29/22 1540 10/29/22 1545  BP:   134/82 134/82   Pulse:      Resp:    20  Temp:   (!) 96.4 F (35.8 C) (!) 97.3 F (36.3 C)  TempSrc:   Oral Axillary  SpO2: 91%   92%  Weight:      Height:  6\' 2"  (1.88 m)     General:  Appears calm and comfortable and is in NAD, still some tachypnea, on 2L [place] O2 Eyes:   EOMI, normal lids, iris ENT:  grossly normal hearing, lips & tongue, mmm Neck:  no LAD, masses or thyromegaly Cardiovascular:  RRR, no m/r/g.  Respiratory:   CTA bilaterally with no wheezes/rales/rhonchi.  Mmildly increased respiratory effort. Abdomen:  soft, NT, ND Skin:  RLE with plantar ulcer at 1st MTP joint and RLE with diffuse erythema, weeping from knee distally      Musculoskeletal:  grossly normal tone BUE/BLE, good ROM, no bony abnormality Psychiatric:  grossly normal mood and affect, speech fluent and appropriate, AOx3 Neurologic:  CN 2-12 grossly intact, moves all extremities in coordinated fashion   Radiological Exams on Admission: Independently reviewed - see discussion in A/P where applicable  DG Chest Port 1 View  Result Date: 10/29/2022 CLINICAL DATA:  Preoperative for right common femoral endarterectomy. [id number]. EXAM: PORTABLE CHEST 1 VIEW COMPARISON:  High-resolution chest CT 03/27/2022, portable chest 08/20/2021 FINDINGS: Upper zonal centrilobular emphysematous changes and basal-predominant UIP-pattern fibrosis are again noted with underlying subpleural ground-glass disease in the lower lung fields. The appearance is similar to the prior studies but the ground-glass disease does limit assessment for early infiltrates. No dense area consolidation or new infiltrate is seen. The cardiac size is normal. A left chest dual lead pacing system with AID wiring is again shown. The mediastinum is normally outlined. There is calcification of the transverse aorta. Osteopenia, degenerative  changes and mild levoscoliosis thoracic spine. IMPRESSION: 1. Background mid to lower zonal subpleural fibrosis and ground-glass disease.  No new lung opacity. 2. COPD and basal-predominant UIP-pattern fibrosis. 3. Aortic atherosclerosis. Electronically Signed   By: Almira Bar M.D.   On: 10/29/2022 07:47    EKG: Independently reviewed.  Ventricular paced with rate 84   Labs on Admission: I have personally reviewed the available labs and imaging studies at the time of the admission.  Pertinent labs:    CO2 19 Glucose 107 BUN 24 BNP 174 HS troponin 64, 60 Unremarkable CBC   Assessment and Plan: Principal Problem:   Acute on chronic systolic (congestive) heart failure (HCC) Active Problems:   Type 2 diabetes mellitus with vascular disease (HCC)   Essential hypertension, benign   IPF (idiopathic pulmonary fibrosis) (HCC)   HLD (hyperlipidemia)   PVD (peripheral vascular disease) (HCC)   Biventricular ICD (implantable cardioverter-defibrillator) in place   Pressure injury of skin    Acute on chronic systolic CHF -Patient with known h/o chronic systolic CHF presenting with worsening SOB and hypoxia -Has ACID, needs interrogation -CXR consistent with IPF and/or pulmonary edema -Mildly elevated BNP without known baseline -With elevated BNP and abnl CXR, acute decompensated CHF seems probable as diagnosis -Will admit, as per the Emergency HF Mortality Risk Grade.  The patient has: severe pulmonary edema requiring new O2 therapy -Will request echocardiogram -CHF order set utilized -Cardiology consulted -Was given Lasix 40 mg x 1 in ER and will repeat with 40 mg IV BID -Continue [place] O2 for now -Continue ASA, Effient  PAD -Needs RLE revascularization -Does not appear to be actively infected, although this is a consideration Pressure Injury 10/29/22 Leg Right;Anterior;Lower Stage 2 -  Partial thickness loss of dermis presenting as a shallow open injury with a red, pink wound bed without slough. Pt stated, "My sock did that". Yellow, pink, open (Active)  10/29/22 1448  Location: Leg  Location Orientation:  Right;Anterior;Lower  Staging: Stage 2 -  Partial thickness loss of dermis presenting as a shallow open injury with a red, pink wound bed without slough.  Wound Description (Comments): Pt stated, "My sock did that". Yellow, pink, open  Present on Admission:   -For surgery tomorrow  Rash -Appears to be c/w drug rash -Patient reports that it is dramatically improved -No further intervention needed currently, as patient is certain that Augmentin was the offending agent and he is no longer taking this  HTN -Continue carvedilol, Imdur -Hold amlodipine -Will also add prn hydralazine  HLD -Continue simvastatin  DM -Last A1c was 5.2 -Continue glargine -Hold glipizide, glucophage  -Will cover with moderate-scale SSI for now  IPF -Not on home O2 -Continue Incruse Ellipta  Stage 3a CKD -Appears to be stable at this time -Attempt to avoid nephrotoxic medications - hold valsartan for now -Recheck BMP in AM      Advance Care Planning:   Code Status: Full Code - Code status was discussed with the patient at the time of admission.  The patient would want to receive full resuscitative measures at this time.   Consults: Cardiology; vascular surgery; CHF navigator; TOC team; nutrition  DVT Prophylaxis: Lovenox  Family Communication: None present; he declined to have me call his wife at the time of evaluation  Severity of Illness: The appropriate patient status for this patient is INPATIENT. Inpatient status is judged to be reasonable and necessary in order to provide the required intensity of service to ensure the patient's safety. The  patient's presenting symptoms, physical exam findings, and initial radiographic and laboratory data in the context of their chronic comorbidities is felt to place them at high risk for further clinical deterioration. Furthermore, it is not anticipated that the patient will be medically stable for discharge from the hospital within 2 midnights of admission.    * I certify that at the point of admission it is my clinical judgment that the patient will require inpatient hospital care spanning beyond 2 midnights from the point of admission due to high intensity of service, high risk for further deterioration and high frequency of surveillance required.*  Author: Jonah Blue, MD 10/29/2022 5:59 PM  For on call review [email or web address].

## 2022-10-29 NOTE — Progress Notes (Signed)
Late entry for 0600 Patient stated he has had some SOB on exertion since yesterday.  He said he checked his O2 saturation at home and it was 94%. Patient placed on 2L O2, his saturation was 88% at first check here.

## 2022-10-30 ENCOUNTER — Inpatient Hospital Stay (HOSPITAL_COMMUNITY): Payer: Medicare Other

## 2022-10-30 ENCOUNTER — Other Ambulatory Visit: Payer: Self-pay

## 2022-10-30 ENCOUNTER — Encounter (HOSPITAL_COMMUNITY): Payer: Self-pay | Admitting: Internal Medicine

## 2022-10-30 DIAGNOSIS — E1169 Type 2 diabetes mellitus with other specified complication: Secondary | ICD-10-CM

## 2022-10-30 DIAGNOSIS — J84112 Idiopathic pulmonary fibrosis: Secondary | ICD-10-CM

## 2022-10-30 DIAGNOSIS — E785 Hyperlipidemia, unspecified: Secondary | ICD-10-CM

## 2022-10-30 DIAGNOSIS — I10 Essential (primary) hypertension: Secondary | ICD-10-CM | POA: Diagnosis not present

## 2022-10-30 DIAGNOSIS — I5023 Acute on chronic systolic (congestive) heart failure: Secondary | ICD-10-CM | POA: Diagnosis not present

## 2022-10-30 DIAGNOSIS — I739 Peripheral vascular disease, unspecified: Secondary | ICD-10-CM | POA: Diagnosis not present

## 2022-10-30 DIAGNOSIS — I5021 Acute systolic (congestive) heart failure: Secondary | ICD-10-CM

## 2022-10-30 DIAGNOSIS — Z95810 Presence of automatic (implantable) cardiac defibrillator: Secondary | ICD-10-CM

## 2022-10-30 LAB — BASIC METABOLIC PANEL
Anion gap: 14 (ref 5–15)
BUN: 24 mg/dL — ABNORMAL HIGH (ref 8–23)
CO2: 21 mmol/L — ABNORMAL LOW (ref 22–32)
Calcium: 9.1 mg/dL (ref 8.9–10.3)
Chloride: 101 mmol/L (ref 98–111)
Creatinine, Ser: 1.5 mg/dL — ABNORMAL HIGH (ref 0.61–1.24)
GFR, Estimated: 48 mL/min — ABNORMAL LOW (ref >60)
Glucose, Bld: 199 mg/dL — ABNORMAL HIGH (ref 70–99)
Potassium: 4.1 mmol/L (ref 3.5–5.1)
Sodium: 136 mmol/L (ref 135–145)

## 2022-10-30 LAB — GLUCOSE, CAPILLARY
Glucose-Capillary: 126 mg/dL — ABNORMAL HIGH (ref 70–99)
Glucose-Capillary: 125 mg/dL — ABNORMAL HIGH (ref 70–99)
Glucose-Capillary: 96 mg/dL (ref 70–99)
Glucose-Capillary: 195 mg/dL — ABNORMAL HIGH (ref 70–99)

## 2022-10-30 LAB — ECHOCARDIOGRAM COMPLETE
Area-P 1/2: 3.16 cm2
Height: 74 in
S' Lateral: 2.5 cm
Weight: 2912 oz

## 2022-10-30 LAB — CBC
HCT: 38.4 % — ABNORMAL LOW (ref 39.0–52.0)
Hemoglobin: 12.8 g/dL — ABNORMAL LOW (ref 13.0–17.0)
MCH: 31.8 pg (ref 26.0–34.0)
MCHC: 33.3 g/dL (ref 30.0–36.0)
MCV: 95.3 fL (ref 80.0–100.0)
Platelets: 313 10*3/uL (ref 150–400)
RBC: 4.03 MIL/uL — ABNORMAL LOW (ref 4.22–5.81)
RDW: 14.4 % (ref 11.5–15.5)
WBC: 9.5 10*3/uL (ref 4.0–10.5)
nRBC: 0 % (ref 0.0–0.2)

## 2022-10-30 MED ORDER — GLUCERNA SHAKE PO LIQD: 237.0000 mL | ORAL | Status: DC

## 2022-10-30 NOTE — Progress Notes (Signed)
Rounding Note    Patient Name: Luke [name] Date of Encounter: 10/30/2022  [facility] Cardiologist: Donato Schultz, MD   Subjective   He reports substantial diuresis but this was not captured on the in/out record. Has not been weighed yet today. Breathing is markedly improved and no longer has weeping from the edematous right leg. Echocardiogram pending. Interrogation of his ICD today shows that he has had evidence of decreased thoracic impedance/volume overload for the last 18 days, but sharp improvement in the last 24 hours, back to baseline  Inpatient Medications    Scheduled Meds:  aspirin EC  81 mg Oral Daily   carvedilol  25 mg Oral BID WC   docusate sodium  100 mg Oral BID   enoxaparin (LOVENOX) injection  40 mg Subcutaneous Q24H   furosemide  40 mg Intravenous BID   insulin aspart  0-15 Units Subcutaneous TID WC   insulin aspart  0-5 Units Subcutaneous QHS   insulin glargine-yfgn  30 Units Subcutaneous Daily   isosorbide mononitrate  30 mg Oral Daily   loratadine  10 mg Oral Daily   pantoprazole  80 mg Oral Daily   prasugrel  10 mg Oral Daily   simvastatin  20 mg Oral QHS   sodium chloride flush  3 mL Intravenous Q12H   umeclidinium bromide  1 puff Inhalation Daily   Continuous Infusions:  PRN Meds: acetaminophen **OR** acetaminophen, albuterol, bisacodyl, hydrALAZINE, ondansetron **OR** ondansetron (ZOFRAN) IV, polyethylene glycol, traZODone   Vital Signs    Vitals:   10/29/22 1934 10/29/22 2314 10/30/22 0335 10/30/22 0753  BP: 137/69 112/66 124/64 (!) 154/74  Pulse: 84 78 79 (!) 101  Resp: 17 14 13 13   Temp: 98.1 F (36.7 C) 98.3 F (36.8 C) 98.3 F (36.8 C) (!) 97.4 F (36.3 C)  TempSrc: Oral Oral Oral Oral  SpO2: 93% 97% 98% 91%  Weight:      Height:        Intake/Output Summary (Last 24 hours) at 10/30/2022 1056 Last data filed at 10/29/2022 2126 Gross per 24 hour  Intake 1090 ml  Output 1200 ml  Net -110 ml      10/29/2022     5:52 AM 10/23/2022   12:57 PM 10/22/2022    9:58 AM  Last 3 Weights  Weight (lbs) 182 lb 189 lb 9.6 oz 188 lb  Weight (kg) 82.555 kg 86.002 kg 85.276 kg      Telemetry    Atrial sensed, biventricular paced rhythm- Personally Reviewed  ECG    A sensed BiV paced- Personally Reviewed  Physical Exam  Appears comfortable.  Generalized macular erythematous rash somewhat improved. GEN: No acute distress.   Neck: No JVD Cardiac: RRR, no murmurs, rubs, or gallops.  Respiratory: Dry crackles in both bases that do not clear with cough GI: Soft, nontender, non-distended  MS: 1+ bilateral lower extremity edema of the feet and calves.  No longer has weeping.  The right lower extremity is erythematous from the knee down.  Severe scaly changes ("crepey" ) skin particularly severe in the right lower extremity Neuro:  Nonfocal  Psych: Normal affect   Labs    High Sensitivity Troponin:   Recent Labs  Lab 10/29/22 0906 10/29/22 1428  TROPONINIHS 64* 60*     Chemistry Recent Labs  Lab 10/23/22 1340 10/29/22 0906 10/30/22 0133  NA 136 141 136  K 4.7 4.4 4.1  CL 101 110 101  CO2 16* 19* 21*  GLUCOSE 277*  107* 199*  BUN 27* 24* 24*  CREATININE 1.21 1.15 1.50*  CALCIUM 9.3 9.2 9.1  PROT  --  6.6  --   ALBUMIN  --  3.5  --   AST  --  27  --   ALT  --  21  --   ALKPHOS  --  83  --   BILITOT  --  0.9  --   GFRNONAA >60 >60 48*  ANIONGAP 19* 12 14    Lipids No results for input(s): "CHOL", "TRIG", "HDL", "LABVLDL", "LDLCALC", "CHOLHDL" in the last 168 hours.  Hematology Recent Labs  Lab 10/23/22 1340 10/29/22 0906 10/30/22 0133  WBC 12.3* 10.1 9.5  RBC 4.14* 4.06* 4.03*  HGB 13.6 13.3 12.8*  HCT 39.9 40.0 38.4*  MCV 96.4 98.5 95.3  MCH 32.9 32.8 31.8  MCHC 34.1 33.3 33.3  RDW 13.8 14.5 14.4  PLT 322 326 313   Thyroid No results for input(s): "TSH", "FREET4" in the last 168 hours.  BNP Recent Labs  Lab 10/29/22 0906  BNP 174.0*    DDimer No results for input(s):  "DDIMER" in the last 168 hours.   Radiology    DG Chest Port 1 View  Result Date: 10/29/2022 CLINICAL DATA:  Preoperative for right common femoral endarterectomy. [id number]. EXAM: PORTABLE CHEST 1 VIEW COMPARISON:  High-resolution chest CT 03/27/2022, portable chest 08/20/2021 FINDINGS: Upper zonal centrilobular emphysematous changes and basal-predominant UIP-pattern fibrosis are again noted with underlying subpleural ground-glass disease in the lower lung fields. The appearance is similar to the prior studies but the ground-glass disease does limit assessment for early infiltrates. No dense area consolidation or new infiltrate is seen. The cardiac size is normal. A left chest dual lead pacing system with AID wiring is again shown. The mediastinum is normally outlined. There is calcification of the transverse aorta. Osteopenia, degenerative changes and mild levoscoliosis thoracic spine. IMPRESSION: 1. Background mid to lower zonal subpleural fibrosis and ground-glass disease. No new lung opacity. 2. COPD and basal-predominant UIP-pattern fibrosis. 3. Aortic atherosclerosis. Electronically Signed   By: Almira Bar M.D.   On: 10/29/2022 07:47    Cardiac Studies   Comprehensive interrogation of his CRT-D-D device today  Normal device function, estimated generator longevity 5.9 years.  All lead parameters within normal range. Only 19% atrial pacing,> 99% biventricular pacing No evidence of atrial fibrillation or ventricular tachycardia Thoracic impedance ([place]) shows drastic decrease consistent with hypovolemia for the last 18 days, with a sharp improvement in the last 24 hours  Patient Profile     77 y.o. male with HFrEF, CAD, recent PCI to LAD and RCA, s/p CRT-D, severe PAD, severe interstitial lung disease, CKD stage III found to be in heart failure exacerbation when he presented for elective surgical revascularization of the right lower extremity  Assessment & Plan    CHF: Marked improvement  symptomatically and based on thoracic impedance testing after diuresis. Need to reestablish his "dry weight" at what ever today's weight shows. Creatinine is up a little today, will stop the IV diuretics.  Awaiting echocardiogram, which may allow [place] to further fine-tune his treatment. He may need daily oral diuretics.  CAD: angina free. Check echo for change in wall motion. CRT-D: normal device function. Consider enrollment in monthly downloads with HF device clinic. PAD: threatened limb.  I believe he is now optimized from a heart failure point of view for planned peripheral vascular surgery.   Rash: Generalized rash is improving and is no longer pruritic (related  to rosuvastatin and then amoxicillin). Has severe xerosis cutis, especially bad on the ischemic leg.  For questions or updates, please contact [facility] Please consult [email or web address] for contact info under        Signed, Thurmon Fair, MD  10/30/2022, 10:56 AM

## 2022-10-30 NOTE — Assessment & Plan Note (Addendum)
Echocardiogram with preserved LV systolic function EF 60 to 65%, no LVH,  RV systolic function preserved, RVSP 26.4 mmHg. LA and RA with no enlargement. No significant valvular disease.   Patient was placed on IV furosemide, negative fluid balance was achieved, with significant improvement in his symptoms. Patient lost 3 kg during his hospitalization.   Continue medical therapy with carvedilol, valsartan and isosorbide.  Continue furosemide as needed for volume overload.   Acute hypoxemic respiratory failure, IPF with mild acute cardiogenic pulmonary edema.  02 saturation is 93% on 2 L. Min per [place].  Patient had positive 02 desaturation on ambulation and will have home 02 prescribed.

## 2022-10-30 NOTE — Assessment & Plan Note (Addendum)
Significant disease per high resolution Ct with centrilobular emphysema, honeycombing, interlobular septal thickening and traction bronchiectasis.   No signs of exacerbation or pulmonary infection.   Patient had oxymetry on room air on ambulation and qualify for supplemental 02.  Patient Saturations on Room Air at Rest = 96%  Patient Saturations on [employer] while Ambulating = 85%  Patient Saturations on 2 Liters of oxygen while Ambulating = 90%  Will order home 02.   Patient had diffuse skin desquamative changes, positive erythema.  I am concerned for possible severe cutaneous adverse reaction, will hold on Pirfenidone for now and continue close follow up.

## 2022-10-30 NOTE — Assessment & Plan Note (Addendum)
Hypokalemia.   Patient tolerated well diuresis, at the time of his discharge his serum cr is 1,16 with K at 3.8 and serum bicarbonate at 21. Na 139   Patient will continue with furosemide as needed for volume overload.  Follow up renal function and electrolytes as outpatient.

## 2022-10-30 NOTE — Assessment & Plan Note (Addendum)
Glucose remained well controlled.  Patient was placed on basal insulin and insulin sliding scale for glucose cover and monitoring.  At home continue metformin and glipizide.   Continue with statin therapy.

## 2022-10-30 NOTE — Progress Notes (Addendum)
Progress Note   Patient: Luke [name]:[id number] DOB: 03-05-1946 DOA: 10/29/2022     1 DOS: the patient was seen and examined on 10/30/2022   Brief hospital course: Mr. Edgington was admitted to the hospital with the working diagnosis of heart failure decompensation.   77 yo male with the past medical history of abdominal aortic aneurysm, atrial fibrillation, heart failure, CKD, T2DM, hyperlipidemia, pulmonary fibrosis, coronary artery disease and peripheral vascular disease who presented with dyspnea. Recently diagnosed with right lower extremity cellulitis 06/13 and placed on Augmentin.  Reported 2 days of worsening dyspnea. He presented to the hospital on the day of admision for right lower extremity revascularization, but he was found volume overloaded and procedure was postponed.   On his initial physical examination his blood pressure was 134/82, RR 20 and 02 saturation 91%, lungs with no wheezing or rales, but increased work of breathing, heart with S1 and S2 present and rhythmic with no gallops, abdomen with no distention. Bilateral lower extremity edema more right than left. Right leg with positive erythema and 1st MTP plantar ulcer.   Na 141, K 4,4 CL 110, bicarbonate 19, glucose 107 bun 24 cr 1,15 BNP 174 High sensitive troponin 64 and 60  Wbc 10,1, hgb 13.3 plt 326   Chest radiograph with cardiomegaly, mild hilar vascular congestion, bilateral interstitial markings, no effusions. Pacemaker defibrillator in place with one atrial, one right ventricular lead and one left ventricular lead.   EKG 84 bpm, right axis, right bundle branch block, qtc 548, ventricular paced rhythm with no significant ST segment or T wave changes.     Assessment and Plan: * Acute on chronic systolic (congestive) heart failure (HCC) Echocardiogram with mild reduction is systolic function 40 to 45% in 2023. The inferior wall and posterior wall were akinetic. No significant valvular disease.   Urine  output 1200 ml Systolic blood pressure 154 to 95 mmHg.   Continue medical therapy with carvedilol, and isosorbide.  Holding diuretic therapy for now.  Follow up on repeat echocardiogram.   Acute hypoxemic respiratory failure, IPF with mild acute cardiogenic pulmonary edema.  02 saturation is 98% on 2 L. Min per [place].   AKI (acute kidney injury) (HCC) Renal function with serum cr at 1,50 with k at 4,1 and serum bicarbonate at 21. Na 136.  Continue to hold on diuretic therapy and will follow up renal function in am.  Avoid hypotension and nephrotoxic medications.   PVD (peripheral vascular disease) (HCC) Plan for revascularization.  Edema has improved, continue to have erythema.   Skin changes superficial, positive ulcerated wounds, doubt acute cellulitis.  Wbc is not elevated and he has been afebrile.  Skin changes possible due to poor circulation.  Plan to continue holding antibiotic therapy for now and follow clinical response.  Will have a low threshold to resume antibiotic therapy.  Consult wound care.   Essential hypertension, benign Blood pressure has been stable. Plan to continue with isosorbide.  Follow up on echocardiogram.   IPF (idiopathic pulmonary fibrosis) (HCC) Significant disease per high resolution Ct with centrilobular emphysema, honeycombing, interlobular septal thickening and traction bronchiectasis.   Continue oxymetry monitoring, will do a  home 02 screen prior to his discharge.  No signs of exacerbation or pulmonary infection.    Type 2 diabetes mellitus with hyperlipidemia (HCC) Continue insulin sliding scale for glucose cover and monitoring.  Continue basal insulin 30 units.  Fasting glucose this am 199.   Continue with statin therapy.  Subjective: Patient with no chest pain, dyspnea and edema have improved. No worsening right leg pain.   Physical Exam: Vitals:   10/29/22 2314 10/30/22 0335 10/30/22 0753 10/30/22 1100  BP: 112/66  124/64 (!) 154/74 (!) 95/56  Pulse: 78 79 (!) 101 70  Resp: 14 13 13 17   Temp: 98.3 F (36.8 C) 98.3 F (36.8 C) (!) 97.4 F (36.3 C) 97.6 F (36.4 C)  TempSrc: Oral Oral Oral Oral  SpO2: 97% 98% 91% 98%  Weight:      Height:       Neurology awake and alert ENT with no pallor Cardiovascular with S1 and S2 present and rhythmic with no gallops, ,rubs or murmurs Respiratory with mild rales at bases with no wheezing or rhonchi Abdomen with no distention  No left lower extremity edema, right lower extremity with trace edema, has local erythema below the knee, dry skin and superficial ulcer at the base of the leg.  Data Reviewed:    Family Communication: no family at the bedside   Disposition: Status is: Inpatient Remains inpatient appropriate because: hemodynamic monitoring   Planned Discharge Destination: Home    Author: Coralie Keens, MD 10/30/2022 11:42 AM  For on call review [email or web address].

## 2022-10-30 NOTE — Assessment & Plan Note (Addendum)
Plan for revascularization.  Right lower extremity cellulitis (present on admission). No sepsis.    He has been on ciprofloxacin in the past and short course of amoxicillin.   Risk vs benefit and possible MRSA skin infection patient was started on oral doxycycline with clinical improvement.  Plan to continue antibiotic therapy for 10 days and have close follow up as outpatient.  Topical mupirocin to ulcerated wound at the distal anterior leg.    Plan to continue with aspirin and prasugrel.

## 2022-10-30 NOTE — Hospital Course (Addendum)
Luke [name] was admitted to the hospital with the working diagnosis of heart failure decompensation, right lower extremity cellulitis.   77 yo male with the past medical history of abdominal aortic aneurysm, atrial fibrillation, heart failure, CKD, T2DM, hyperlipidemia, pulmonary fibrosis, coronary artery disease and peripheral vascular disease who presented with dyspnea. Recently diagnosed with right lower extremity cellulitis 06/13 and placed on Augmentin.  Reported 2 days of worsening dyspnea. He presented to the hospital on the day of admision for right lower extremity revascularization, but he was found volume overloaded and procedure was postponed.   On his initial physical examination his blood pressure was 134/82, RR 20 and 02 saturation 91%, lungs with no wheezing or rales, but increased work of breathing, heart with S1 and S2 present and rhythmic with no gallops, abdomen with no distention. Bilateral lower extremity edema more right than left. Right leg with positive erythema and 1st MTP plantar ulcer.   Na 141, K 4,4 CL 110, bicarbonate 19, glucose 107 bun 24 cr 1,15 BNP 174 High sensitive troponin 64 and 60  Wbc 10,1, hgb 13.3 plt 326   Chest radiograph with cardiomegaly, mild hilar vascular congestion, bilateral interstitial markings, no effusions. Pacemaker defibrillator in place with one atrial lead, one right ventricular lead and one left ventricular lead.   EKG 84 bpm, right axis, right bundle branch block, qtc 548, ventricular paced rhythm with no significant ST segment or T wave changes.   06/27 volume status has improved, but continue to have pain and erythema on his right lower extremity.  Started on oral antibiotic therapy, if stable in 24hrs will plan to discharge home and continue with oral antibiotic therapy.  06/28 significant improvement in right leg rash, plan to continue with doxycyline for 10 days and follow up as outpatient.

## 2022-10-30 NOTE — Progress Notes (Addendum)
  Progress Note    10/30/2022 7:40 AM 1 Day Post-Op  Subjective:  feels right leg swelling is greatly improved, feels breathing is also improved   Vitals:   10/29/22 2314 10/30/22 0335  BP: 112/66 124/64  Pulse: 78 79  Resp: 14 13  Temp: 98.3 F (36.8 C) 98.3 F (36.8 C)  SpO2: 97% 98%   Physical Exam: Cardiac:  regular Lungs:  non labored Extremities:  RLE erythematous. No weeping. Scaly skin present on right leg. Swelling improved Neurologic: alert and oriented   CBC    Component Value Date/Time   WBC 9.5 10/30/2022 0133   RBC 4.03 (L) 10/30/2022 0133   HGB 12.8 (L) 10/30/2022 0133   HGB 14.5 10/02/2022 1054   HCT 38.4 (L) 10/30/2022 0133   HCT 44.2 10/02/2022 1054   PLT 313 10/30/2022 0133   PLT 283 10/02/2022 1054   MCV 95.3 10/30/2022 0133   MCV 97 10/02/2022 1054   MCH 31.8 10/30/2022 0133   MCHC 33.3 10/30/2022 0133   RDW 14.4 10/30/2022 0133   RDW 12.4 10/02/2022 1054   LYMPHSABS 1.1 10/29/2022 0906   MONOABS 0.9 10/29/2022 0906   EOSABS 0.3 10/29/2022 0906   BASOSABS 0.1 10/29/2022 0906    BMET    Component Value Date/Time   NA 136 10/30/2022 0133   NA 142 10/02/2022 1054   K 4.1 10/30/2022 0133   CL 101 10/30/2022 0133   CO2 21 (L) 10/30/2022 0133   GLUCOSE 199 (H) 10/30/2022 0133   BUN 24 (H) 10/30/2022 0133   BUN 25 10/02/2022 1054   CREATININE 1.50 (H) 10/30/2022 0133   CREATININE 1.05 04/13/2021 1628   CALCIUM 9.1 10/30/2022 0133   GFRNONAA 48 (L) 10/30/2022 0133   GFRAA >60 02/12/2019 0956    INR    Component Value Date/Time   INR 1.2 08/30/2022 1416     Intake/Output Summary (Last 24 hours) at 10/30/2022 0740 Last data filed at 10/29/2022 2126 Gross per 24 hour  Intake 1090 ml  Output 1000 ml  Net 90 ml     Assessment/Plan:  77 y.o. male who was scheduled for RLE revascularization 6/25 with Dr. Randie Heinz but he presented with generalized rash and increased SOB. His surgery was cancelled.   RLE some improvement of edema and  weeping No change in right foot ulcerations Rash seems to be improving Diuresing per TRH Scr up 1.5 this morning  Continue Statin and Aspirin Appreciate TRH and Cardiology assistance  Will defer to Dr. Randie Heinz regarding rescheduling his RLE surgery  [facility] [phone] 10/30/2022 7:40 AM  I have independently interviewed and patient and agree with PA assessment and plan above.  Patient appears much more comfortable today than yesterday and the color of his leg is also improved.  Fortunately continues to have scaly skin throughout this would be best to clear out prior to any surgical intervention he demonstrates good understanding of this we will be okay for discharge from vascular standpoint and I will set him up for follow-up visit as an outpatient to schedule surgical intervention.  Tegh Franek C. Randie Heinz, MD [facility] [place] Office: [phone] Pager: [phone]

## 2022-10-30 NOTE — Assessment & Plan Note (Addendum)
Blood pressure has been stable. Plan to continue with isosorbide, carvedilol and valsartan.

## 2022-10-30 NOTE — Progress Notes (Signed)
  Echocardiogram 2D Echocardiogram has been performed.  Luke [name] 10/30/2022, 2:10 PM

## 2022-10-30 NOTE — Progress Notes (Signed)
Heart Failure Nurse Navigator Progress Note  PCP: [name], MD PCP-Cardiologist: Anne Fu Admission Diagnosis: None Admitted from: Home  Presentation:   Luke [name] presented with shortness of breath, has plans for revascularization in January , ended up getting coronary stents in May. CXR concerns for volume overload, BNP 174.   Patient was educated on the sign and symptoms of heart failure, daily weights, when to call his doctor or go to the ED. Diet/ fluid restrictions, taking ll medications as prescribed and attending all medical appointments. Patient verbalized his understanding, a HF TOC appointment was scheduled for 11/12/2022 @ 3 pm.   ECHO/ LVEF: 40-45%  Clinical Course:  Past Medical History:  Diagnosis Date   AAA (abdominal aortic aneurysm) (HCC) 2011   Per vascular surgery   Atrial fibrillation (HCC)    CAD (coronary artery disease)    Presumed CAD with nuclear scan October 09, 2011,  large anteroseptal MI and inferior MI. Catheterization scheduled October 15, 2011   Cardiomyopathy [facility])    Nuclear, October 09, 2011, EF 30%, multiple focal wall motion abnormalities   Chronic kidney disease    CKD3   Diabetes mellitus    type II   GERD (gastroesophageal reflux disease)    Silent   HLD (hyperlipidemia)    Hypertension    white coat HTN-- often elevated in office and controlled on outside checks.   ICD (implantable cardioverter-defibrillator) in place    CRT-D placed March, 2014 complete heart block and k dysfunction   IPF (idiopathic pulmonary fibrosis) (HCC)    LBBB (left bundle branch block)    LBBB on EKG October 11, 2011,  no prior EKG has been done   Low testosterone    Hx of   Pacemaker    PAD (peripheral artery disease) (HCC)    Pancreatitis    Umbilical hernia      Social History   Socioeconomic History   Marital status: Married    Spouse name: Patti   Number of children: 1   Years of education: Not on file   Highest education level: Associate degree:  academic program  Occupational History   Occupation: retired  Tobacco Use   Smoking status: Former    Packs/day: 2.00    Years: 40.00    Additional pack years: 0.00    Total pack years: 80.00    Types: Cigarettes    Start date: 1964    Quit date: 05/06/2004    Years since quitting: 18.4    Passive exposure: Never   Smokeless tobacco: Never  Vaping Use   Vaping Use: Never used  Substance and Sexual Activity   Alcohol use: Not Currently   Drug use: No   Sexual activity: Yes    Partners: Female  Other Topics Concern   Not on file  Social History Narrative   [place] vet, he has known service related agent orange exposure.    Retired   Community education officer daily.     Social Determinants of Health   Financial Resource Strain: Low Risk  (10/16/2022)   Overall Financial Resource Strain (CARDIA)    Difficulty of Paying Living Expenses: Not hard at all  Food Insecurity: No Food Insecurity (10/16/2022)   Hunger Vital Sign    Worried About Running Out of Food in the Last Year: Never true    Ran Out of Food in the Last Year: Never true  Transportation Needs: No Transportation Needs (10/16/2022)   PRAPARE - Transportation    Lack of Transportation (  Medical): No    Lack of Transportation (Non-Medical): No  Physical Activity: Sufficiently Active (10/16/2022)   Exercise Vital Sign    Days of Exercise per Week: 6 days    Minutes of Exercise per Session: 50 min  Stress: No Stress Concern Present (10/16/2022)   [employer] of Occupational Health - Occupational Stress Questionnaire    Feeling of Stress : Not at all  Social Connections: Moderately Integrated (10/16/2022)   Social Connection and Isolation Panel [NHANES]    Frequency of Communication with Friends and Family: Three times a week    Frequency of Social Gatherings with Friends and Family: Once a week    Attends Religious Services: More than 4 times per year    Active Member of [employer] or Organizations: No    Attends Hospital doctor: Never    Marital Status: Married   Water engineer and Provision:  Detailed education and instructions provided on heart failure disease management including the following:  Signs and symptoms of Heart Failure When to call the physician Importance of daily weights Low sodium diet Fluid restriction Medication management Anticipated future follow-up appointments  Patient education given on each of the above topics.  Patient acknowledges understanding via teach back method and acceptance of all instructions.  Education Materials:  "Living Better With Heart Failure" Booklet, HF zone tool, & Daily Weight Tracker Tool.  Patient has scale at home: Yes Patient has pill box at home: Yes    High Risk Criteria for Readmission and/or Poor Patient Outcomes: Heart failure hospital admissions (last 6 months): 0  No Show rate: 2 % Difficult social situation: No Demonstrates medication adherence: yes Primary Language: English Literacy level: Reading, writing, and comprehension  Barriers of Care:   Continued HF education Diet/ fluid restrictions/ daily weights  Considerations/Referrals:   Referral made to Heart Failure Pharmacist Stewardship: yes Referral made to Heart Failure CSW/NCM TOC: No Referral made to Heart & Vascular TOC clinic: Yes, 11/12/2022 @ 3 pm   Items for Follow-up on DC/TOC: Continued HF education Diet/ fluid restrictions/ daily weights   Rhae Hammock, BSN, RN Heart Failure Teacher, adult education Only

## 2022-10-30 NOTE — Progress Notes (Signed)
Initial Nutrition Assessment  DOCUMENTATION CODES:   Not applicable  INTERVENTION:  Vanilla Glucerna Shake po daily, each supplement provides 220 kcal and 10 grams of protein  Encourage po intake    NUTRITION DIAGNOSIS:   Increased nutrient needs related to wound healing as evidenced by estimated needs.  GOAL:   Patient will meet greater than or equal to 90% of their needs   MONITOR:   Labs, PO intake, I & O's, Supplement acceptance, Weight trends, Skin  REASON FOR ASSESSMENT:   Consult Other (Comment) (nutritional goal)  ASSESSMENT:   77 y.o. male with PMHx including AAA, afib, chronic systolic CHF with AICD, CKD 3, T2DM, HTN, HLD, IPF and PAD presents with rash, SOB and hypoxia, in need of RLE revascularization  Per MD note, CXR with concern for volume overload Surgery is cancelled for now   Visited patient at bedside whose NPO restrictions were lifted this morning. RD listened to patient order breakfast for lunch since he was not able to get any breakfast this morning. He reports a stable and wt and that he weighs 182# every day. He reports an excellent appetite and eats 3 meals per day along with a snack before bed. Patient has knowledge on how to keep his blood glucose consistent and has prior diet education. He reports feeling strong despite requiring so many stents in the past 6 weeks which could be the reason his A1c has increased.   Patient denies N/V/D/C, trouble chewing/swallowing   Labs: Glu 199, BUN 24, Cr 1.5, A1c 7.9 Meds: coreg, colace, lovenox, insulin, protonix, effient, zocor, NS  Wt: no significant wt changes  PO: 75% meal intake x 1 documented meal  I/O's: -110 ml since admission   NUTRITION - FOCUSED PHYSICAL EXAM:  Flowsheet Row Most Recent Value  Orbital Region No depletion  Upper Arm Region No depletion  Thoracic and Lumbar Region No depletion  Buccal Region No depletion  Temple Region No depletion  Clavicle Bone Region No depletion   Clavicle and Acromion Bone Region No depletion  Scapular Bone Region No depletion  Dorsal Hand No depletion  Patellar Region No depletion  Anterior Thigh Region No depletion  Posterior Calf Region No depletion  Edema (RD Assessment) None  Hair Reviewed  Eyes Reviewed  Mouth Reviewed  Skin Reviewed  Nails Reviewed       Diet Order:   Diet Order             Diet Heart Room service appropriate? Yes; Fluid consistency: Thin  Diet effective now                   EDUCATION NEEDS:   No education needs have been identified at this time  Skin:  Skin Assessment: Skin Integrity Issues: Skin Integrity Issues:: Stage II, Diabetic Ulcer Stage II: L leg Diabetic Ulcer: R foot  Last BM:  PTA  Height:   Ht Readings from Last 1 Encounters:  10/29/22 6\' 2"  (1.88 m)    Weight:   Wt Readings from Last 1 Encounters:  10/29/22 82.6 kg    Ideal Body Weight:     BMI:  Body mass index is 23.37 kg/m.  Estimated Nutritional Needs:   Kcal:  1610-9604  Protein:  99-124 g  Fluid:  per physician discretion    Leodis Rains, RDN, LDN  Clinical Nutrition

## 2022-10-31 DIAGNOSIS — I739 Peripheral vascular disease, unspecified: Secondary | ICD-10-CM | POA: Diagnosis not present

## 2022-10-31 DIAGNOSIS — N179 Acute kidney failure, unspecified: Secondary | ICD-10-CM | POA: Diagnosis not present

## 2022-10-31 DIAGNOSIS — I5023 Acute on chronic systolic (congestive) heart failure: Secondary | ICD-10-CM | POA: Diagnosis not present

## 2022-10-31 DIAGNOSIS — I10 Essential (primary) hypertension: Secondary | ICD-10-CM | POA: Diagnosis not present

## 2022-10-31 LAB — GLUCOSE, CAPILLARY
Glucose-Capillary: 69 mg/dL — ABNORMAL LOW (ref 70–99)
Glucose-Capillary: 94 mg/dL (ref 70–99)
Glucose-Capillary: 202 mg/dL — ABNORMAL HIGH (ref 70–99)
Glucose-Capillary: 155 mg/dL — ABNORMAL HIGH (ref 70–99)
Glucose-Capillary: 160 mg/dL — ABNORMAL HIGH (ref 70–99)

## 2022-10-31 LAB — CBC
HCT: 35.3 % — ABNORMAL LOW (ref 39.0–52.0)
Hemoglobin: 11.7 g/dL — ABNORMAL LOW (ref 13.0–17.0)
MCH: 32 pg (ref 26.0–34.0)
MCHC: 33.1 g/dL (ref 30.0–36.0)
MCV: 96.4 fL (ref 80.0–100.0)
Platelets: 308 10*3/uL (ref 150–400)
RBC: 3.66 MIL/uL — ABNORMAL LOW (ref 4.22–5.81)
RDW: 14.3 % (ref 11.5–15.5)
WBC: 9.9 10*3/uL (ref 4.0–10.5)
nRBC: 0 % (ref 0.0–0.2)

## 2022-10-31 LAB — BASIC METABOLIC PANEL
Anion gap: 10 (ref 5–15)
BUN: 25 mg/dL — ABNORMAL HIGH (ref 8–23)
CO2: 23 mmol/L (ref 22–32)
Calcium: 8.6 mg/dL — ABNORMAL LOW (ref 8.9–10.3)
Chloride: 104 mmol/L (ref 98–111)
Creatinine, Ser: 1.36 mg/dL — ABNORMAL HIGH (ref 0.61–1.24)
GFR, Estimated: 54 mL/min — ABNORMAL LOW (ref >60)
Glucose, Bld: 133 mg/dL — ABNORMAL HIGH (ref 70–99)
Potassium: 3.3 mmol/L — ABNORMAL LOW (ref 3.5–5.1)
Sodium: 137 mmol/L (ref 135–145)

## 2022-10-31 MED ORDER — POTASSIUM CHLORIDE CRYS ER 20 MEQ PO TBCR
40.0000 meq | EXTENDED_RELEASE_TABLET | Freq: Once | ORAL | Status: AC
  Administered 2022-10-31: 40 meq via ORAL
  Filled 2022-10-31: qty 2

## 2022-10-31 MED ORDER — DOXYCYCLINE HYCLATE 100 MG PO TABS
100.0000 mg | ORAL_TABLET | Freq: Two times a day (BID) | ORAL | Status: DC
  Administered 2022-10-31 – 2022-11-01 (×3): 100 mg via ORAL
  Filled 2022-10-31 (×3): qty 1

## 2022-10-31 MED ORDER — MUPIROCIN CALCIUM 2 % EX CREA
TOPICAL_CREAM | Freq: Two times a day (BID) | CUTANEOUS | Status: DC
  Filled 2022-10-31: qty 15

## 2022-10-31 NOTE — Evaluation (Signed)
Occupational Therapy Evaluation Patient Details Name: Luke [name] MRN: [id number] DOB: 1946-04-13 Today's Date: 10/31/2022   History of Present Illness 77 y.o. male presents to [facility] 6/25 for RLE revascularization. Reports SOB for previous 2-3 days Found to have total body rash and not appropriate for sx. CXR concerning for volume overload. Admitted for treatment of acute on chronic systolic heart failure. PMH:AAA, afib, chronic systolic CHF with AICD, stage 3 CKD, DM, HTN, HLD, IPF not on home O2,  and PAD   Clinical Impression   Patient admitted for the diagnosis above.  PTA he lives at home with his spouse, who can provide any needed assist.  Currently he is very close to his baseline, needing no assist for in room mobility and ADL completion at a sit to stand level.  Patient does desaturate with mobility, but quickly rebounds with rest and deep breathing.  On supplemental O2.  No significant OT needs exist in the acute setting, and no post acute rehab is anticipated.  Energy conservation reviewed, with patient verbalizing understanding.        Recommendations for follow up therapy are one component of a multi-disciplinary discharge planning process, led by the attending physician.  Recommendations may be updated based on patient status, additional functional criteria and insurance authorization.   Assistance Recommended at Discharge Set up Supervision/Assistance  Patient can return home with the following Assist for transportation    Functional Status Assessment  Patient has not had a recent decline in their functional status  Equipment Recommendations  None recommended by OT    Recommendations for Other Services       Precautions / Restrictions Precautions Precautions: Fall Precaution Comments: O2 sats Restrictions Weight Bearing Restrictions: No      Mobility Bed Mobility               General bed mobility comments: Up in the recliner Patient Response:  Cooperative  Transfers Overall transfer level: Modified independent Equipment used: None                      Balance Overall balance assessment: Mild deficits observed, not formally tested                                         ADL either performed or assessed with clinical judgement   ADL Overall ADL's : At baseline                                             Vision Baseline Vision/History: 1 Wears glasses Patient Visual Report: No change from baseline       Perception     Praxis      Pertinent Vitals/Pain Pain Assessment Pain Assessment: Faces Faces Pain Scale: Hurts even more Pain Location: R foot vascular pain Pain Descriptors / Indicators: Tender, Throbbing Pain Intervention(s): Monitored during session     Hand Dominance Right   Extremity/Trunk Assessment Upper Extremity Assessment Upper Extremity Assessment: Overall WFL for tasks assessed   Lower Extremity Assessment Lower Extremity Assessment: Overall WFL for tasks assessed   Cervical / Trunk Assessment Cervical / Trunk Assessment: Normal   Communication Communication Communication: No difficulties   Cognition Arousal/Alertness: Awake/alert Behavior During Therapy: WFL for tasks assessed/performed Overall Cognitive Status: Within Functional  Limits for tasks assessed                                                        Home Living Family/patient expects to be discharged to:: Private residence Living Arrangements: Spouse/significant other Available Help at Discharge: Family;Available 24 hours/day Type of Home: House Home Access: Stairs to enter [employer] of Steps: 3   Home Layout: One level     Bathroom Shower/Tub: Chief Strategy Officer: Standard     Home Equipment: Grab bars - tub/shower          Prior Functioning/Environment Prior Level of Function : Independent/Modified Independent                         OT Problem List: Pain      OT Treatment/Interventions:      OT Goals(Current goals can be found in the care plan section) Acute Rehab OT Goals Patient Stated Goal: Return home OT Goal Formulation: With patient Time For Goal Achievement: 11/08/22 Potential to Achieve Goals: Good  OT Frequency:      Co-evaluation              AM-PAC OT "6 Clicks" Daily Activity     Outcome Measure Help from another person eating meals?: None Help from another person taking care of personal grooming?: None Help from another person toileting, which includes using toliet, bedpan, or urinal?: None Help from another person bathing (including washing, rinsing, drying)?: None Help from another person to put on and taking off regular upper body clothing?: None Help from another person to put on and taking off regular lower body clothing?: None 6 Click Score: 24   End of Session Equipment Utilized During Treatment: Oxygen Nurse Communication: Mobility status  Activity Tolerance: Patient tolerated treatment well Patient left: in chair;with call bell/phone within reach  OT Visit Diagnosis: Pain Pain - Right/Left: Right Pain - part of body: Ankle and joints of foot                Time: 4401-0272 OT Time Calculation (min): 16 min Charges:  OT General Charges $OT Visit: 1 Visit OT Evaluation $OT Eval Moderate Complexity: 1 Mod  10/31/2022  RP, OTR/L  Acute Rehabilitation Services  Office:  [phone]   Suzanna Obey 10/31/2022, 9:59 AM

## 2022-10-31 NOTE — Progress Notes (Signed)
SATURATION QUALIFICATIONS: (This note is used to comply with regulatory documentation for home oxygen)  Patient Saturations on Room Air at Rest = 96%  Patient Saturations on Room Air while Ambulating = 85%  Patient Saturations on 2 Liters of oxygen while Ambulating = 90%  Please briefly explain why patient needs home oxygen: Pt requires 2L O2 via [place] to be able to maintain SpO2 >90%O2 with ambulation.   Damon Hargrove B. Beverely Risen PT, DPT Acute Rehabilitation Services Please use secure chat or  Call Office [phone]

## 2022-10-31 NOTE — Evaluation (Signed)
Physical Therapy Evaluation Patient Details Name: Luke [name] MRN: [id number] DOB: 28-Jan-1946 Today's Date: 10/31/2022  History of Present Illness  77 y.o. male presents to [facility] 6/25 for RLE revascularization. Reports SOB for previous 2-3 days Found to have total body rash and not appropriate for sx. CXR concerning for volume overload. Admitted for treatment of acute on chronic systolic heart failure. PMH:AAA, afib, chronic systolic CHF with AICD, stage 3 CKD, DM, HTN, HLD, IPF not on home O2,  and PAD  Clinical Impression  PTA pt completely independent, living with wife in single story home with 3 steps to enter. Pt is limited in safe mobility by increased R foot ischemic pain which causes minor instability and drifting gait without AD. Pt requiring 2L O2 via [place] to maintain SpO2 >90%O2 with ambulation. Pt does not have any post acute PT needs, however will continue to follow acutely to work on gait with painful R foot.       Recommendations for follow up therapy are one component of a multi-disciplinary discharge planning process, led by the attending physician.  Recommendations may be updated based on patient status, additional functional criteria and insurance authorization.     Assistance Recommended at Discharge None  Patient can return home with the following       Equipment Recommendations None recommended by PT  Recommendations for Other Services       Functional Status Assessment Patient has had a recent decline in their functional status and demonstrates the ability to make significant improvements in function in a reasonable and predictable amount of time.     Precautions / Restrictions Precautions Precautions: Fall Precaution Comments: O2 sats Restrictions Weight Bearing Restrictions: No      Mobility  Bed Mobility Overal bed mobility: Independent                  Transfers Overall transfer level: Modified independent Equipment used: None                     Ambulation/Gait Ambulation/Gait assistance: Modified independent (Device/Increase time) Gait Distance (Feet): 300 Feet Assistive device: None Gait Pattern/deviations: Decreased stance time - right, Decreased weight shift to right, Step-through pattern, Drifts right/left Gait velocity: too fast for conditions Gait velocity interpretation: >2.62 ft/sec, indicative of community ambulatory   General Gait Details: increased R foot pain causing decreased weightshift to R LE and drifting in the hallway, causing mild instability, no overt LoB        Balance Overall balance assessment: Mild deficits observed, not formally tested                                           Pertinent Vitals/Pain Pain Assessment Pain Assessment: Faces Faces Pain Scale: Hurts little more Pain Location: R foot vascular pain Pain Descriptors / Indicators: Tender, Throbbing Pain Intervention(s): Limited activity within patient's tolerance, Monitored during session, Repositioned    Home Living Family/patient expects to be discharged to:: Private residence Living Arrangements: Spouse/significant other Available Help at Discharge: Family;Available 24 hours/day Type of Home: House Home Access: Stairs to enter   [employer] of Steps: 3   Home Layout: One level Home Equipment: Grab bars - tub/shower      Prior Function Prior Level of Function : Independent/Modified Independent  Hand Dominance   Dominant Hand: Right    Extremity/Trunk Assessment   Upper Extremity Assessment Upper Extremity Assessment: Defer to OT evaluation    Lower Extremity Assessment Lower Extremity Assessment: RLE deficits/detail RLE Deficits / Details: ROM WFL, Strength grossly 4?5, pain with weightbearing    Cervical / Trunk Assessment Cervical / Trunk Assessment: Normal  Communication   Communication: No difficulties  Cognition Arousal/Alertness:  Awake/alert Behavior During Therapy: WFL for tasks assessed/performed Overall Cognitive Status: Within Functional Limits for tasks assessed                                          General Comments General comments (skin integrity, edema, etc.): poor wave form on HR monitor and oxygen monitor secondary to skin conditions        Assessment/Plan    PT Assessment Patient needs continued PT services  PT Problem List Pain;Decreased skin integrity;Cardiopulmonary status limiting activity       PT Treatment Interventions Gait training;Balance training    PT Goals (Current goals can be found in the Care Plan section)  Acute Rehab PT Goals Patient Stated Goal: go home and mow lawc PT Goal Formulation: With patient Time For Goal Achievement: 11/14/22 Potential to Achieve Goals: Good    Frequency Min 1X/week        AM-PAC PT "6 Clicks" Mobility  Outcome Measure Help needed turning from your back to your side while in a flat bed without using bedrails?: None Help needed moving from lying on your back to sitting on the side of a flat bed without using bedrails?: None Help needed moving to and from a bed to a chair (including a wheelchair)?: None Help needed standing up from a chair using your arms (e.g., wheelchair or bedside chair)?: None Help needed to walk in hospital room?: None Help needed climbing 3-5 steps with a railing? : None 6 Click Score: 24    End of Session Equipment Utilized During Treatment: Oxygen Activity Tolerance: Patient tolerated treatment well Patient left: in bed;with call bell/phone within reach;with family/visitor present Nurse Communication: Mobility status PT Visit Diagnosis: Other abnormalities of gait and mobility (R26.89)    Time: 2956-2130 PT Time Calculation (min) (ACUTE ONLY): 28 min   Charges:   PT Evaluation $PT Eval Moderate Complexity: 1 Mod PT Treatments $Gait Training: 8-22 mins        Verbena Boeding B. Beverely Risen  PT, DPT Acute Rehabilitation Services Please use secure chat or  Call Office [phone]   Elon Alas [facility] 10/31/2022, 3:47 PM

## 2022-10-31 NOTE — Progress Notes (Addendum)
Rounding Note    Patient Name: Luke [name] Date of Encounter: 10/31/2022  [facility] Cardiologist: Donato Schultz, MD   Subjective   Feels that his breathing is "great".  Although he has mild residual edema in the ankles that is greatly improved. Weight is down 7 pounds from admission weight. Renal function parameters are improving today, heading back towards baseline. Echo showed normal left ventricular regional wall motion and normal overall left ventricular systolic function.  Based on the mitral Doppler parameters he is now euvolemic.  Inpatient Medications    Scheduled Meds:  aspirin EC  81 mg Oral Daily   carvedilol  25 mg Oral BID WC   docusate sodium  100 mg Oral BID   enoxaparin (LOVENOX) injection  40 mg Subcutaneous Q24H   feeding supplement (GLUCERNA SHAKE)  237 mL Oral Q24H   insulin aspart  0-15 Units Subcutaneous TID WC   insulin aspart  0-5 Units Subcutaneous QHS   insulin glargine-yfgn  30 Units Subcutaneous Daily   isosorbide mononitrate  30 mg Oral Daily   loratadine  10 mg Oral Daily   pantoprazole  80 mg Oral Daily   prasugrel  10 mg Oral Daily   simvastatin  20 mg Oral QHS   sodium chloride flush  3 mL Intravenous Q12H   umeclidinium bromide  1 puff Inhalation Daily   Continuous Infusions:  PRN Meds: acetaminophen **OR** acetaminophen, albuterol, bisacodyl, hydrALAZINE, ondansetron **OR** ondansetron (ZOFRAN) IV, polyethylene glycol, traZODone   Vital Signs    Vitals:   10/30/22 1935 10/30/22 2325 10/31/22 0605 10/31/22 0757  BP: 133/73 130/62 136/81 120/86  Pulse:   80 77  Resp: 18 16 12 14   Temp: 97.7 F (36.5 C) (!) 97.5 F (36.4 C) (!) 97.5 F (36.4 C) (!) 97.3 F (36.3 C)  TempSrc: Oral Oral Oral Oral  SpO2: 97% 96% 98% 99%  Weight:   79.4 kg   Height:        Intake/Output Summary (Last 24 hours) at 10/31/2022 0839 Last data filed at 10/31/2022 0757 Gross per 24 hour  Intake 300 ml  Output 950 ml  Net -650 ml       10/31/2022    6:05 AM 10/29/2022    5:52 AM 10/23/2022   12:57 PM  Last 3 Weights  Weight (lbs) 175 lb 182 lb 189 lb 9.6 oz  Weight (kg) 79.379 kg 82.555 kg 86.002 kg      Telemetry    Atrial sensed, biventricular paced rhythm- Personally Reviewed  ECG    No new tracing- Personally Reviewed  Physical Exam  Comfortable lying fully supine in bed GEN: No acute distress.   Neck: No JVD Cardiac: RRR, no murmurs, rubs, or gallops.  1+ bilateral ankle edema, improved. Respiratory: Clear to auscultation bilaterally. GI: Soft, nontender, non-distended  MS: No edema; No deformity.  Severe xerosis cutis Neuro:  Nonfocal  Psych: Normal affect   Labs    High Sensitivity Troponin:   Recent Labs  Lab 10/29/22 0906 10/29/22 1428  TROPONINIHS 64* 60*     Chemistry Recent Labs  Lab 10/29/22 0906 10/30/22 0133 10/31/22 0141  NA 141 136 137  K 4.4 4.1 3.3*  CL 110 101 104  CO2 19* 21* 23  GLUCOSE 107* 199* 133*  BUN 24* 24* 25*  CREATININE 1.15 1.50* 1.36*  CALCIUM 9.2 9.1 8.6*  PROT 6.6  --   --   ALBUMIN 3.5  --   --   AST  27  --   --   ALT 21  --   --   ALKPHOS 83  --   --   BILITOT 0.9  --   --   GFRNONAA >60 48* 54*  ANIONGAP 12 14 10     Lipids No results for input(s): "CHOL", "TRIG", "HDL", "LABVLDL", "LDLCALC", "CHOLHDL" in the last 168 hours.  Hematology Recent Labs  Lab 10/29/22 0906 10/30/22 0133 10/31/22 0141  WBC 10.1 9.5 9.9  RBC 4.06* 4.03* 3.66*  HGB 13.3 12.8* 11.7*  HCT 40.0 38.4* 35.3*  MCV 98.5 95.3 96.4  MCH 32.8 31.8 32.0  MCHC 33.3 33.3 33.1  RDW 14.5 14.4 14.3  PLT 326 313 308   Thyroid No results for input(s): "TSH", "FREET4" in the last 168 hours.  BNP Recent Labs  Lab 10/29/22 0906  BNP 174.0*    DDimer No results for input(s): "DDIMER" in the last 168 hours.   Radiology    ECHOCARDIOGRAM COMPLETE  Result Date: 10/30/2022    ECHOCARDIOGRAM REPORT   Patient Name:   Luke [name] Date of Exam: 10/30/2022 Medical Rec  #:  [id number]        Height:       74.0 in Accession #:    [id number]       Weight:       182.0 lb Date of Birth:  1945/05/09         BSA:          2.088 m Patient Age:    77 years         BP:           100/54 mmHg Patient Gender: M                HR:           72 bpm. Exam Location:  Inpatient Procedure: 2D Echo, Cardiac Doppler and Color Doppler Indications:    CHF-Acute Systolic 428.21 / I50.21  History:        Patient has prior history of Echocardiogram examinations, most                 recent 07/17/2021. CHF and Cardiomyopathy, CAD, AAA and PAD,                 Arrythmias:LBBB, Signs/Symptoms:Chest Pain; Risk                 Factors:Diabetes, Hypertension and Dyslipidemia.  Sonographer:    Aron Baba Referring Phys: [id number] JENNIFER YATES  Sonographer Comments: Suboptimal subcostal window. Image acquisition challenging due to respiratory motion. IMPRESSIONS  1. Left ventricular ejection fraction, by estimation, is 60 to 65%. The left ventricle has normal function. The left ventricle has no regional wall motion abnormalities. Left ventricular diastolic parameters are consistent with Grade I diastolic dysfunction (impaired relaxation).  2. Right ventricular systolic function is normal. The right ventricular size is mildly enlarged. There is normal pulmonary artery systolic pressure. The estimated right ventricular systolic pressure is 26.4 mmHg.  3. The mitral valve is grossly normal. Trivial mitral valve regurgitation. No evidence of mitral stenosis.  4. The aortic valve is tricuspid. Aortic valve regurgitation is not visualized. Aortic valve sclerosis/calcification is present, without any evidence of aortic stenosis.  5. The inferior vena cava is normal in size with greater than 50% respiratory variability, suggesting right atrial pressure of 3 mmHg. Comparison(s): Changes from prior study are noted. The left ventricular function has improved. FINDINGS  Left Ventricle: Left ventricular ejection  fraction, by  estimation, is 60 to 65%. The left ventricle has normal function. The left ventricle has no regional wall motion abnormalities. The left ventricular internal cavity size was normal in size. There is  no left ventricular hypertrophy. Left ventricular diastolic parameters are consistent with Grade I diastolic dysfunction (impaired relaxation). Right Ventricle: The right ventricular size is mildly enlarged. No increase in right ventricular wall thickness. Right ventricular systolic function is normal. There is normal pulmonary artery systolic pressure. The tricuspid regurgitant velocity is 2.42  m/s, and with an assumed right atrial pressure of 3 mmHg, the estimated right ventricular systolic pressure is 26.4 mmHg. Left Atrium: Left atrial size was normal in size. Right Atrium: Right atrial size was normal in size. Pericardium: There is no evidence of pericardial effusion. Mitral Valve: The mitral valve is grossly normal. Trivial mitral valve regurgitation. No evidence of mitral valve stenosis. Tricuspid Valve: The tricuspid valve is grossly normal. Tricuspid valve regurgitation is trivial. No evidence of tricuspid stenosis. Aortic Valve: The aortic valve is tricuspid. Aortic valve regurgitation is not visualized. Aortic valve sclerosis/calcification is present, without any evidence of aortic stenosis. Pulmonic Valve: The pulmonic valve was grossly normal. Pulmonic valve regurgitation is trivial. No evidence of pulmonic stenosis. Aorta: The aortic root and ascending aorta are structurally normal, with no evidence of dilitation. Venous: The inferior vena cava is normal in size with greater than 50% respiratory variability, suggesting right atrial pressure of 3 mmHg. IAS/Shunts: The atrial septum is grossly normal. Additional Comments: A device lead is visualized in the right atrium and right ventricle.  LEFT VENTRICLE PLAX 2D LVIDd:         3.40 cm   Diastology LVIDs:         2.50 cm   LV e' medial:    7.51 cm/s LV PW:          0.80 cm   LV E/e' medial:  8.4 LV IVS:        0.70 cm   LV e' lateral:   8.38 cm/s LVOT diam:     2.15 cm   LV E/e' lateral: 7.5 LV SV:         77 LV SV Index:   37 LVOT Area:     3.63 cm  RIGHT VENTRICLE RV S prime:     10.40 cm/s TAPSE (M-mode): 1.9 cm LEFT ATRIUM             Index        RIGHT ATRIUM           Index LA diam:        2.70 cm 1.29 cm/m   RA Area:     20.30 cm LA Vol (A2C):   56.0 ml 26.82 ml/m  RA Volume:   64.10 ml  30.70 ml/m LA Vol (A4C):   55.3 ml 26.48 ml/m LA Biplane Vol: 56.4 ml 27.01 ml/m  AORTIC VALVE             PULMONIC VALVE LVOT Vmax:   101.00 cm/s PR End Diast Vel: 8.53 msec LVOT Vmean:  71.400 cm/s LVOT VTI:    0.213 m  AORTA Ao Root diam: 3.10 cm Ao Asc diam:  3.60 cm MITRAL VALVE                TRICUSPID VALVE MV Area (PHT): 3.16 cm     TR Peak grad:   23.4 mmHg MV Decel Time: 240 msec     TR Vmax:  242.00 cm/s MV E velocity: 63.00 cm/s MV A velocity: 106.00 cm/s  SHUNTS MV E/A ratio:  0.59         Systemic VTI:  0.21 m                             Systemic Diam: 2.15 cm Lennie Odor MD Electronically signed by Lennie Odor MD Signature Date/Time: 10/30/2022/2:42:09 PM    Final     Cardiac Studies   Echocardiogram above shows normal left ventricular systolic function and normal left atrial pressure, consistent with euvolemia.  Patient Profile     77 y.o. male with HFrEF, CAD, recent PCI to LAD and RCA, s/p CRT-D, severe PAD, severe interstitial lung disease, CKD stage III found to be in heart failure exacerbation when he presented for elective surgical revascularization of the right lower extremity  Assessment & Plan    Appears to be clinically euvolemic. Try to maintain within 3 pounds of current weight (take as needed dose of furosemide when weight is 178 pounds or higher. Despite the fact that he clearly has diastolic heart failure, will avoid SGLT2 inhibitors due to his current problems with critical limb ischemia and severe skin  disease. Will enroll in the heart failure device clinic.  [facility] will sign off.   Medication Recommendations:   Take furosemide 40 mg p.o. daily on days that morning weight is 178 pounds or higher.  No change in other cardiac medications. Other recommendations (labs, testing, etc): Sodium restricted diet.  Daily weights every morning home, keep a log of weights and bring it to office appointments.  Take the prescribed diuretic on days when weight is 178 pounds or higher. Follow up as an outpatient: Has appointment scheduled with Dr. Berton Mount 12/17/2022 and Dr. Donato Schultz 01/23/2023.  Will also enroll him in the heart failure device clinic  For questions or updates, please contact [facility] Please consult [email or web address] for contact info under        Signed, Thurmon Fair, MD  10/31/2022, 8:39 AM

## 2022-10-31 NOTE — Progress Notes (Signed)
  Progress Note    10/31/2022 10:33 AM 2 Days Post-Op  Subjective: Feeling much better now without shortness of breath on 2 L nasal cannula lying flat on his back  Vitals:   10/31/22 0605 10/31/22 0757  BP: 136/81 120/86  Pulse: 80 77  Resp: 12 14  Temp: (!) 97.5 F (36.4 C) (!) 97.3 F (36.3 C)  SpO2: 98% 99%    Physical Exam: Awake alert oriented Nonlabored respirations this morning Right leg edema and erythema has improved  CBC    Component Value Date/Time   WBC 9.9 10/31/2022 0141   RBC 3.66 (L) 10/31/2022 0141   HGB 11.7 (L) 10/31/2022 0141   HGB 14.5 10/02/2022 1054   HCT 35.3 (L) 10/31/2022 0141   HCT 44.2 10/02/2022 1054   PLT 308 10/31/2022 0141   PLT 283 10/02/2022 1054   MCV 96.4 10/31/2022 0141   MCV 97 10/02/2022 1054   MCH 32.0 10/31/2022 0141   MCHC 33.1 10/31/2022 0141   RDW 14.3 10/31/2022 0141   RDW 12.4 10/02/2022 1054   LYMPHSABS 1.1 10/29/2022 0906   MONOABS 0.9 10/29/2022 0906   EOSABS 0.3 10/29/2022 0906   BASOSABS 0.1 10/29/2022 0906    BMET    Component Value Date/Time   NA 137 10/31/2022 0141   NA 142 10/02/2022 1054   K 3.3 (L) 10/31/2022 0141   CL 104 10/31/2022 0141   CO2 23 10/31/2022 0141   GLUCOSE 133 (H) 10/31/2022 0141   BUN 25 (H) 10/31/2022 0141   BUN 25 10/02/2022 1054   CREATININE 1.36 (H) 10/31/2022 0141   CREATININE 1.05 04/13/2021 1628   CALCIUM 8.6 (L) 10/31/2022 0141   GFRNONAA 54 (L) 10/31/2022 0141   GFRAA >60 02/12/2019 0956    INR    Component Value Date/Time   INR 1.2 08/30/2022 1416     Intake/Output Summary (Last 24 hours) at 10/31/2022 1033 Last data filed at 10/31/2022 0757 Gross per 24 hour  Intake 300 ml  Output 950 ml  Net -650 ml     Assessment/plan:  77 y.o. male initially presented for right lower extremity bypass but was having significant shortness of breath requiring oxygen and also skin issues possibly related to medication ministration.  Cardiology is now following.  He has  outpatient follow-up on July 9 and I will have a phone call or see him on July 10 to discuss surgery as long as his skin issues and shortness of breath have resolved.    Gracielynn Birkel C. Randie Heinz, MD [facility] Office: [phone] Pager: [phone]  10/31/2022 10:33 AM

## 2022-10-31 NOTE — Progress Notes (Signed)
   Heart Failure Stewardship Pharmacist Progress Note   PCP: [name], MD PCP-Cardiologist: Donato Schultz, MD    HPI:  77 yo M with PMH of AAA, afib, CHF with AICD, CAD, CHB s/p CRT-D, CKD III, T2DM, HTN, HLD, IPF, and PAD.   He underwent aortogram and RLE angiography in 05/2022 and was noted to have significant disease, was planned for femoral endarterectomy and bypass. In May, he had anginal symptoms and had a planned cardiac cath on 5/15 with stent placement in the LAD and chronic total occlusion of RCA with collateral from Cx. Elective PCI with 3 DES to RCA on 6/6.  Presented to [facility] on 6/25 for elective LE revascularization. Complained of generalized rash, shortness of breath. Procedure cancelled and patient admitted. CXR with volume overload. ECHO 6/26 with LVEF 60-65% (40-45% in 07/2021), no regional wall motion abnormalities, G1DD, RV normal, trivial MR. Deferring vascular procedure to outpatient.   Current HF Medications: Beta Blocker: carvedilol 25 mg BID  Prior to admission HF Medications: Beta blocker: carvedilol 25 mg BID ACE/ARB/ARNI: valsartan 20 mg daily  Pertinent Lab Values: Serum creatinine 1.36, BUN 25, Potassium 3.3, Sodium 137, BNP 174, A1c 7.9   Vital Signs: Weight: 175 lbs (admission weight: 182 lbs) Blood pressure: 120/80s  Heart rate: 60-70s  I/O: -0.4L yesterday; net -0.8L  Medication Assistance / Insurance Benefits Check: Does the patient have prescription insurance?  Yes Type of insurance plan: Medicare + BCBS supplement  Outpatient Pharmacy:  Prior to admission outpatient pharmacy: [facility] Is the patient willing to use [facility] TOC pharmacy at discharge? Yes   Assessment: 1. Acute on chronic diastolic CHF (LVEF 60-65%). NYHA class II symptoms. - Off IV lasix. Volume improved. Likely needs PRN dosing at discharge. Strict I/Os and daily weights. Keep K>4 and Mg>2. - Continue carvedilol 25 mg BID - Stopped lisinopril as possible cause of  pancreatitis. Consider restarting ARB, renal function improving.  - Stopped spironolactone in the past with hyperkalemia - No SGLT2i per cards with severe limb ischemia and skin infections.    Plan: 1) Medication changes recommended at this time: - Add PRN lasix for discharge - Restart ARB  2) Patient assistance: - None pending  3)  Education  - Patient has been educated on current HF medications and potential additions to HF medication regimen - Patient verbalizes understanding that over the next few months, these medication doses may change and more medications may be added to optimize HF regimen - Patient has been educated on basic disease state pathophysiology and goals of therapy   Sharen Hones, PharmD, BCPS Heart Failure Stewardship Pharmacist Phone [phone]

## 2022-10-31 NOTE — Progress Notes (Signed)
Progress Note   Patient: Luke [name] DOB: 1945/08/28 DOA: 10/29/2022     2 DOS: the patient was seen and examined on 10/31/2022   Brief hospital course: Luke [name] was admitted to the hospital with the working diagnosis of heart failure decompensation.   77 yo male with the past medical history of abdominal aortic aneurysm, atrial fibrillation, heart failure, CKD, T2DM, hyperlipidemia, pulmonary fibrosis, coronary artery disease and peripheral vascular disease who presented with dyspnea. Recently diagnosed with right lower extremity cellulitis 06/13 and placed on Augmentin.  Reported 2 days of worsening dyspnea. He presented to the hospital on the day of admision for right lower extremity revascularization, but he was found volume overloaded and procedure was postponed.   On his initial physical examination his blood pressure was 134/82, RR 20 and 02 saturation 91%, lungs with no wheezing or rales, but increased work of breathing, heart with S1 and S2 present and rhythmic with no gallops, abdomen with no distention. Bilateral lower extremity edema more right than left. Right leg with positive erythema and 1st MTP plantar ulcer.   Na 141, K 4,4 CL 110, bicarbonate 19, glucose 107 bun 24 cr 1,15 BNP 174 High sensitive troponin 64 and 60  Wbc 10,1, hgb 13.3 plt 326   Chest radiograph with cardiomegaly, mild hilar vascular congestion, bilateral interstitial markings, no effusions. Pacemaker defibrillator in place with one atrial, one right ventricular lead and one left ventricular lead.   EKG 84 bpm, right axis, right bundle branch block, qtc 548, ventricular paced rhythm with no significant ST segment or T wave changes.   06/27 volume status has improved, but continue to have pain and erythema on his right lower extremity.  Started on oral antibiotic therapy, if stable in 24hrs will plan to discharge home and continue with oral antibiotic therapy.   Assessment and Plan: *  Acute on chronic systolic (congestive) heart failure (HCC) Echocardiogram with preserved LV systolic function EF 60 to 65%, no LVH,  RV systolic function preserved, RVSP 26.4 mmHg. LA and RA with no enlargement. No significant valvular disease.   Urine output 650  ml Systolic blood pressure 118 to 120 mmHg.   Continue medical therapy with carvedilol, and isosorbide.  Continue furosemide as needed for volume overload.   Acute hypoxemic respiratory failure, IPF with mild acute cardiogenic pulmonary edema.  02 saturation is 96% on 2 L. Min per [place].  Check ambulatory 02 on room air, he may qualify for home 02.   AKI (acute kidney injury) (HCC) Hypokalemia.   Today renal function with serum cr at 1,36 with K at 3,3 and serum bicarbonate at 23. Na 137.   Add 40 Kcl po and follow up electrolytes in am. Furosemide as needed.   PVD (peripheral vascular disease) (HCC) Plan for revascularization.  Edema has improved, continue to have erythema.   Today his leg has local edema, and tenderness. Positive anterior distal leg ulcerated wound with purulent drainage.  He has been on ciprofloxacin in the past and short course of amoxicillin.   Risk vs benefit and possible MRSA skin infection will start patient on oral doxycycline. If clinically improves will plan to continue oral antibiotic therapy to be continued as outpatient.     Essential hypertension, benign Blood pressure has been stable. Plan to continue with isosorbide.   IPF (idiopathic pulmonary fibrosis) (HCC) Significant disease per high resolution Ct with centrilobular emphysema, honeycombing, interlobular septal thickening and traction bronchiectasis.   Continue oxymetry monitoring, will do  a  home 02 screen prior to his discharge.  No signs of exacerbation or pulmonary infection.    Type 2 diabetes mellitus with hyperlipidemia (HCC) Continue insulin sliding scale for glucose cover and monitoring.  Continue basal insulin 30  units.  Fasting glucose this am 133   Continue with statin therapy.         Subjective: Patient is feeling better, continue to have right lower extremity pain and erythema, positive local edema   Physical Exam: Vitals:   10/30/22 2325 10/31/22 0605 10/31/22 0757 10/31/22 1127  BP: 130/62 136/81 120/86 (!) 118/50  Pulse:  80 77 67  Resp: 16 12 14 19   Temp: (!) 97.5 F (36.4 C) (!) 97.5 F (36.4 C) (!) 97.3 F (36.3 C) (!) 97.4 F (36.3 C)  TempSrc: Oral Oral Oral Oral  SpO2: 96% 98% 99% 96%  Weight:  79.4 kg    Height:       Neurology awake and alert ENT with mild pallor Cardiovascular with S1 and S2 present and rhythmic with no gallops, rubs or murmurs No JVD Respiratory with scattered rales with no wheezing or rhonchi Abdomen with no distention  Right leg with local edema, erythema and tenderness, distal anterior leg with ulcerated wound with purulence.      Data Reviewed:    Family Communication: no family at the bedside   Disposition: Status is: Inpatient Remains inpatient appropriate because: right lower extremity cellulitis   Planned Discharge Destination: Home    Author: Coralie Keens, MD 10/31/2022 11:51 AM  For on call review [email or web address].

## 2022-11-01 ENCOUNTER — Other Ambulatory Visit (HOSPITAL_COMMUNITY): Payer: Self-pay

## 2022-11-01 DIAGNOSIS — N179 Acute kidney failure, unspecified: Secondary | ICD-10-CM | POA: Diagnosis not present

## 2022-11-01 DIAGNOSIS — I5023 Acute on chronic systolic (congestive) heart failure: Secondary | ICD-10-CM | POA: Diagnosis not present

## 2022-11-01 DIAGNOSIS — I739 Peripheral vascular disease, unspecified: Secondary | ICD-10-CM | POA: Diagnosis not present

## 2022-11-01 DIAGNOSIS — I10 Essential (primary) hypertension: Secondary | ICD-10-CM | POA: Diagnosis not present

## 2022-11-01 LAB — LIPID PANEL
Cholesterol: 125 mg/dL (ref 0–200)
HDL: 31 mg/dL — ABNORMAL LOW (ref >40)
LDL Cholesterol: 51 mg/dL (ref 0–99)
Total CHOL/HDL Ratio: 4 RATIO
Triglycerides: 215 mg/dL — ABNORMAL HIGH (ref <150)
VLDL: 43 mg/dL — ABNORMAL HIGH (ref 0–40)

## 2022-11-01 LAB — CBC WITH DIFFERENTIAL/PLATELET
Abs Immature Granulocytes: 0.06 10*3/uL (ref 0.00–0.07)
Basophils Absolute: 0.1 10*3/uL (ref 0.0–0.1)
Basophils Relative: 1 %
Eosinophils Absolute: 0.4 10*3/uL (ref 0.0–0.5)
Eosinophils Relative: 5 %
HCT: 39.8 % (ref 39.0–52.0)
Hemoglobin: 13.1 g/dL (ref 13.0–17.0)
Immature Granulocytes: 1 %
Lymphocytes Relative: 14 %
Lymphs Abs: 1.3 10*3/uL (ref 0.7–4.0)
MCH: 32.1 pg (ref 26.0–34.0)
MCHC: 32.9 g/dL (ref 30.0–36.0)
MCV: 97.5 fL (ref 80.0–100.0)
Monocytes Absolute: 0.9 10*3/uL (ref 0.1–1.0)
Monocytes Relative: 9 %
Neutro Abs: 6.9 10*3/uL (ref 1.7–7.7)
Neutrophils Relative %: 70 %
Platelets: 318 10*3/uL (ref 150–400)
RBC: 4.08 MIL/uL — ABNORMAL LOW (ref 4.22–5.81)
RDW: 14.4 % (ref 11.5–15.5)
WBC: 9.7 10*3/uL (ref 4.0–10.5)
nRBC: 0 % (ref 0.0–0.2)

## 2022-11-01 LAB — BASIC METABOLIC PANEL WITH GFR
Anion gap: 13 (ref 5–15)
BUN: 23 mg/dL (ref 8–23)
CO2: 21 mmol/L — ABNORMAL LOW (ref 22–32)
Calcium: 9.1 mg/dL (ref 8.9–10.3)
Chloride: 105 mmol/L (ref 98–111)
Creatinine, Ser: 1.16 mg/dL (ref 0.61–1.24)
GFR, Estimated: >60 mL/min
Glucose, Bld: 187 mg/dL — ABNORMAL HIGH (ref 70–99)
Potassium: 3.8 mmol/L (ref 3.5–5.1)
Sodium: 139 mmol/L (ref 135–145)

## 2022-11-01 LAB — LDL CHOLESTEROL, DIRECT: Direct LDL: 53 mg/dL (ref 0–99)

## 2022-11-01 LAB — GLUCOSE, CAPILLARY
Glucose-Capillary: 133 mg/dL — ABNORMAL HIGH (ref 70–99)
Glucose-Capillary: 198 mg/dL — ABNORMAL HIGH (ref 70–99)

## 2022-11-01 MED ORDER — MUPIROCIN 2 % EX OINT
TOPICAL_OINTMENT | Freq: Two times a day (BID) | CUTANEOUS | 0 refills | Status: DC
  Filled 2022-11-01: qty 22, 10d supply, fill #0

## 2022-11-01 MED ORDER — DOXYCYCLINE HYCLATE 100 MG PO TABS
100.0000 mg | ORAL_TABLET | Freq: Two times a day (BID) | ORAL | 0 refills | Status: AC
  Filled 2022-11-01: qty 20, 10d supply, fill #0

## 2022-11-01 MED ORDER — FUROSEMIDE 40 MG PO TABS: 40.0000 mg | ORAL_TABLET | Freq: Every day | ORAL | Status: DC | PRN

## 2022-11-01 MED ORDER — FUROSEMIDE 40 MG PO TABS
40.0000 mg | ORAL_TABLET | Freq: Every day | ORAL | 0 refills | Status: DC | PRN
  Filled 2022-11-01: qty 30, 30d supply, fill #0

## 2022-11-01 NOTE — Progress Notes (Signed)
Discharge instructions reviewed with pt. Pt verbalized understanding of instructions, when to call his MD, his follow up appointments, and daily weights and when to take his lasix for weight increases/edema per his instructions (CHF education).  Copy of instructions given to pt.  [facility] filled scripts for pt and meds were delivered to pt's room. Portable O2 tank has been delivered to pt's room.  Pt has called his wife and she will call him when she arrives at the front entrance. Pt states his wife stated the [facility] company is there at their home setting up the O2 concentrator at this moment, then she will be on her way to pick him up.  Pt getting dressed at this time.   Annice Needy, RN SWOT

## 2022-11-01 NOTE — Plan of Care (Signed)
  Problem: Health Behavior/Discharge Planning: Goal: Ability to manage health-related needs will improve Outcome: Not Progressing   

## 2022-11-01 NOTE — Progress Notes (Signed)
   Heart Failure Stewardship Pharmacist Progress Note   PCP: [name], MD PCP-Cardiologist: Donato Schultz, MD    HPI:  77 yo M with PMH of AAA, afib, CHF with AICD, CAD, CHB s/p CRT-D, CKD III, T2DM, HTN, HLD, IPF, and PAD.   He underwent aortogram and RLE angiography in 05/2022 and was noted to have significant disease, was planned for femoral endarterectomy and bypass. In May, he had anginal symptoms and had a planned cardiac cath on 5/15 with stent placement in the LAD and chronic total occlusion of RCA with collateral from Cx. Elective PCI with 3 DES to RCA on 6/6.  Presented to [facility] on 6/25 for elective LE revascularization. Complained of generalized rash, shortness of breath. Procedure cancelled and patient admitted. CXR with volume overload. ECHO 6/26 with LVEF 60-65% (40-45% in 07/2021), no regional wall motion abnormalities, G1DD, RV normal, trivial MR. Deferring vascular procedure to outpatient.   Discharge HF Medications: Diuretic: furosemide 40 mg daily PRN Beta Blocker: carvedilol 25 mg BID ACE/ARB/ARNI: valsartan 40 mg daily  Prior to admission HF Medications: Beta blocker: carvedilol 25 mg BID ACE/ARB/ARNI: valsartan 20 mg daily  Pertinent Lab Values: Serum creatinine 1.16, BUN 23, Potassium 3.8, Sodium 139, BNP 174, A1c 7.9   Vital Signs: Weight: 175 lbs (admission weight: 182 lbs) Blood pressure: 140/60s  Heart rate: 60-70s  I/O: -0.4L yesterday; net -1.1L  Medication Assistance / Insurance Benefits Check: Does the patient have prescription insurance?  Yes Type of insurance plan: Medicare + BCBS supplement  Outpatient Pharmacy:  Prior to admission outpatient pharmacy: [facility] Is the patient willing to use [facility] TOC pharmacy at discharge? Yes   Assessment: 1. Acute on chronic diastolic CHF (LVEF 60-65%). NYHA class II symptoms. - Off IV lasix. Volume improved. Agree with PRN dosing at discharge. Strict I/Os and daily weights. Keep K>4 and  Mg>2. - Continue carvedilol 25 mg BID - Stopped lisinopril as possible cause of pancreatitis. Agree with restarting valsartan at discharge.  - Stopped spironolactone in the past with hyperkalemia - No SGLT2i per cards with severe limb ischemia and skin infections.    Plan: 1) Medication changes recommended at this time: - Agree with changes; discharge today  2) Patient assistance: - None pending  3)  Education  - Patient has been educated on current HF medications and potential additions to HF medication regimen - Patient verbalizes understanding that over the next few months, these medication doses may change and more medications may be added to optimize HF regimen - Patient has been educated on basic disease state pathophysiology and goals of therapy   Sharen Hones, PharmD, BCPS Heart Failure Stewardship Pharmacist Phone [phone]

## 2022-11-01 NOTE — TOC Transition Note (Signed)
Transition of Care (TOC) - CM/SW Discharge Note Donn Pierini RN, BSN Transitions of Care Unit 4E- RN Case Manager See Treatment Team for direct phone #   Patient Details  Name: Luke [name] MRN: [id number] Date of Birth: October 25, 1945  Transition of Care [facility]) CM/SW Contact:  Darrold Span, RN Phone Number: 11/01/2022, 2:20 PM   Clinical Narrative:    Pt stable for transition home today, Orders placed for home 02 needs.   CM in to speak with pt at bedside- discussed home 02 needs and offered choice for provider- pt states he has no preference.  Also discussed HH needs- as CM was notified by [facility] that they have VSS office referral for [facility]. Pt voiced that the was agreeable with [facility] services- CM notified liaison for start of care needs.   Spouse to transport home once portable 02 arrives.  Call made to [employer] liaison for home 02 needs- portable 02 to be delivered here prior to discharge with home equipment going to the home later today.   No further TOC needs noted.    Final next level of care: Home w Home Health Services Barriers to Discharge: No Barriers Identified   Patient Goals and CMS Choice CMS Medicare.gov Compare Post Acute Care list provided to:: Patient Choice offered to / list presented to : Patient  Discharge Placement             Home w/ [facility]            Discharge Plan and Services Additional resources added to the After Visit Summary for     Discharge Planning Services: CM Consult Post Acute Care Choice: Durable Medical Equipment, Home Health          DME Arranged: Oxygen DME Agency: [facility] Date DME Agency Contacted: 11/01/22 Time DME Agency Contacted: 1330 Representative spoke with at DME Agency: Michail Jewels Agency: [facility] Date [facility] Agency Contacted: 11/01/22 Time HH Agency Contacted: 1000 Representative spoke with at [facility] Agency: Bjorn Loser  Social Determinants of Health (SDOH) Interventions SDOH Screenings   Food  Insecurity: No Food Insecurity (10/30/2022)  Housing: Low Risk  (10/30/2022)  Transportation Needs: No Transportation Needs (10/30/2022)  Utilities: Not At Risk (10/30/2022)  Alcohol Screen: Low Risk  (10/30/2022)  Depression (PHQ2-9): Low Risk  (10/17/2022)  Financial Resource Strain: Low Risk  (10/30/2022)  Physical Activity: Sufficiently Active (10/16/2022)  Social Connections: Moderately Integrated (10/16/2022)  Stress: No Stress Concern Present (10/16/2022)  Tobacco Use: Medium Risk (10/30/2022)     Readmission Risk Interventions    11/01/2022    2:20 PM  Readmission Risk Prevention Plan  Post Dischage Appt Complete  Medication Screening Complete  Transportation Screening Complete

## 2022-11-01 NOTE — Progress Notes (Signed)
Mobility Specialist Progress Note:   11/01/22 1157  Mobility  Activity Ambulated with assistance in hallway  Level of Assistance Standby assist, set-up cues, supervision of patient - no hands on  Assistive Device None  Distance Ambulated (ft) 500 ft  Activity Response Tolerated well  Mobility Referral Yes  $Mobility charge 1 Mobility  Mobility Specialist Start Time (ACUTE ONLY) 1138  Mobility Specialist Stop Time (ACUTE ONLY) 1150  Mobility Specialist Time Calculation (min) (ACUTE ONLY) 12 min   Pt received in bed agreeable to ambulate. During ambulation on RA pt desat to 84%-86% but asymptomatic, O2 flow increased to 2L/min. Pt c/o of mild pain in his R foot, otherwise no c/o. Pt assisted back to EOB, call bell in hand, all needs met. Pre Mobility RA SPO2 90% During Mobility   RA SPO2 85%  2L/min SPO2 94% Post Mobility 2L/min SPO2 90%  Thompson Grayer Mobility Specialist  Please contact vis Secure Chat or  Rehab Office [phone]

## 2022-11-01 NOTE — Care Management Important Message (Signed)
Important Message  Patient Details  Name: Luke [name] MRN: [id number] Date of Birth: [birth year]   Medicare Important Message Given:  Yes     Renie Ora 11/01/2022, 8:42 AM

## 2022-11-01 NOTE — Discharge Summary (Addendum)
Physician Discharge Summary   Patient: Luke [name] MRN: [id number] DOB: 26-Jun-1945  Admit date:     10/29/2022  Discharge date: 11/01/22  Discharge Physician: York Ram Millisa Giarrusso   PCP: [name], MD   Recommendations at discharge:    Patient will take furosemide as needed for wight above 178 lbs.  Continue doxycycline for 10 days.  Pirfenidone discontinue for possible sever skin adverse reaction. Added supplemental 02 per [place]. Follow up with Dr. Para March in 7 to 10 days. Follow up renal function and electrolytes in 7 days as outpatient.  Follow up with Cardiology as scheduled. Follow up with vascular surgery as scheduled.   Discharge Diagnoses: Principal Problem:   Acute on chronic systolic (congestive) heart failure (HCC) Active Problems:   PVD (peripheral vascular disease) (HCC)   AKI (acute kidney injury) (HCC)   Essential hypertension, benign   IPF (idiopathic pulmonary fibrosis) (HCC)   Type 2 diabetes mellitus with hyperlipidemia (HCC)   Biventricular ICD (implantable cardioverter-defibrillator) in place  Resolved Problems:   * No resolved hospital problems. [facility] Course: Mr. [name] was admitted to the hospital with the working diagnosis of heart failure decompensation, right lower extremity cellulitis.   77 yo male with the past medical history of abdominal aortic aneurysm, atrial fibrillation, heart failure, CKD, T2DM, hyperlipidemia, pulmonary fibrosis, coronary artery disease and peripheral vascular disease who presented with dyspnea. Recently diagnosed with right lower extremity cellulitis 06/13 and placed on Augmentin.  Reported 2 days of worsening dyspnea. He presented to the hospital on the day of admision for right lower extremity revascularization, but he was found volume overloaded and procedure was postponed.   On his initial physical examination his blood pressure was 134/82, RR 20 and 02 saturation 91%, lungs with no wheezing or rales, but  increased work of breathing, heart with S1 and S2 present and rhythmic with no gallops, abdomen with no distention. Bilateral lower extremity edema more right than left. Right leg with positive erythema and 1st MTP plantar ulcer.   Na 141, K 4,4 CL 110, bicarbonate 19, glucose 107 bun 24 cr 1,15 BNP 174 High sensitive troponin 64 and 60  Wbc 10,1, hgb 13.3 plt 326   Chest radiograph with cardiomegaly, mild hilar vascular congestion, bilateral interstitial markings, no effusions. Pacemaker defibrillator in place with one atrial lead, one right ventricular lead and one left ventricular lead.   EKG 84 bpm, right axis, right bundle branch block, qtc 548, ventricular paced rhythm with no significant ST segment or T wave changes.   06/27 volume status has improved, but continue to have pain and erythema on his right lower extremity.  Started on oral antibiotic therapy, if stable in 24hrs will plan to discharge home and continue with oral antibiotic therapy.  06/28 significant improvement in right leg rash, plan to continue with doxycyline for 10 days and follow up as outpatient.   Assessment and Plan: * Acute on chronic systolic (congestive) heart failure (HCC) Echocardiogram with preserved LV systolic function EF 60 to 65%, no LVH,  RV systolic function preserved, RVSP 26.4 mmHg. LA and RA with no enlargement. No significant valvular disease.   Patient was placed on IV furosemide, negative fluid balance was achieved, with significant improvement in his symptoms. Patient lost 3 kg during his hospitalization.   Continue medical therapy with carvedilol, valsartan and isosorbide.  Continue furosemide as needed for volume overload.   Acute hypoxemic respiratory failure, IPF with mild acute cardiogenic pulmonary edema.  02 saturation  is 93% on 2 L. Min per [place].  Patient had positive 02 desaturation on ambulation and will have home 02 prescribed.    AKI (acute kidney injury) (HCC) Hypokalemia.    Patient tolerated well diuresis, at the time of his discharge his serum cr is 1,16 with K at 3.8 and serum bicarbonate at 21. Na 139   Patient will continue with furosemide as needed for volume overload.  Follow up renal function and electrolytes as outpatient.   PVD (peripheral vascular disease) (HCC) Plan for revascularization.  Right lower extremity cellulitis (present on admission). No sepsis.    He has been on ciprofloxacin in the past and short course of amoxicillin.   Risk vs benefit and possible MRSA skin infection patient was started on oral doxycycline with clinical improvement.  Plan to continue antibiotic therapy for 10 days and have close follow up as outpatient.  Topical mupirocin to ulcerated wound at the distal anterior leg.    Plan to continue with aspirin and prasugrel.   Essential hypertension, benign Blood pressure has been stable. Plan to continue with isosorbide, carvedilol and valsartan.   IPF (idiopathic pulmonary fibrosis) (HCC) Significant disease per high resolution Ct with centrilobular emphysema, honeycombing, interlobular septal thickening and traction bronchiectasis.   No signs of exacerbation or pulmonary infection.   Patient had oxymetry on room air on ambulation and qualify for supplemental 02.  Patient Saturations on Room Air at Rest = 96%  Patient Saturations on [employer] while Ambulating = 85%  Patient Saturations on 2 Liters of oxygen while Ambulating = 90%  Will order home 02.   Patient had diffuse skin desquamative changes, positive erythema.  I am concerned for possible severe cutaneous adverse reaction, will hold on Pirfenidone for now and continue close follow up.   Type 2 diabetes mellitus with hyperlipidemia (HCC) Glucose remained well controlled.  Patient was placed on basal insulin and insulin sliding scale for glucose cover and monitoring.  At home continue metformin and glipizide.   Continue with statin therapy.           Consultants: cardiology, vascular surgery  Procedures performed: none   Disposition: Home Diet recommendation:  Cardiac and Carb modified diet DISCHARGE MEDICATION: Allergies as of 11/01/2022       Reactions   Aldactone [spironolactone] Other (See Comments)   Hyperkalemia   Brilinta [ticagrelor] Shortness Of Breath   Numbness in hands and feet Blurry vision   Clopidogrel Rash   Redness and Itchiness   Codeine Rash, Hives   Amoxicillin Hives   Atorvastatin    Myalgias with lipitor.  Does tolerate simvastatin.     Januvia [sitagliptin] Other (See Comments)   Diarrhea and heart racing   Jardiance [empagliflozin] Other (See Comments)   Polyuria; excessive weight loss   Lisinopril    Possible cause of pancreatitis   Rosuvastatin Other (See Comments)   myalgia        Medication List     STOP taking these medications    Esbriet 267 MG Tabs Generic drug: Pirfenidone       TAKE these medications    Accu-Chek Aviva Plus test strip Generic drug: glucose blood AS DIRECTED TO CHECK BLOOD SUGAR TWICE DAILY   albuterol 108 (90 Base) MCG/ACT inhaler Commonly known as: VENTOLIN HFA Inhale 2 puffs into the lungs every 6 (six) hours as needed for wheezing or shortness of breath.   amLODipine 10 MG tablet Commonly known as: NORVASC TAKE 1 TABLET(10 MG) BY MOUTH DAILY  aspirin EC 81 MG tablet Take 1 tablet (81 mg total) by mouth daily.   carvedilol 25 MG tablet Commonly known as: COREG TAKE 1 TABLET BY MOUTH TWICE DAILY WITH MEALS   cetirizine 10 MG tablet Commonly known as: ZYRTEC Take 1 tablet (10 mg total) by mouth daily.   doxycycline 100 MG tablet Commonly known as: VIBRA-TABS Take 1 tablet (100 mg total) by mouth every 12 (twelve) hours for 10 days.   furosemide 40 MG tablet Commonly known as: LASIX Take 1 tablet (40 mg total) by mouth daily as needed for edema or fluid (weight 178 lbs or higher.).   glipiZIDE 5 MG tablet Commonly known as:  GLUCOTROL Take 1 tablet (5 mg total) by mouth 2 (two) times daily before a meal.   Incruse Ellipta 62.5 MCG/ACT Aepb Generic drug: umeclidinium bromide Inhale 1 puff into the lungs daily.   isosorbide mononitrate 30 MG 24 hr tablet Commonly known as: IMDUR Take 1 tablet (30 mg total) by mouth daily.   Lantus SoloStar 100 UNIT/ML Solostar Pen Generic drug: insulin glargine INJECT 0.3 TO 0.35 MLS(30 TO 35 UNITS) INTO THE SKIN EVERY DAY   metFORMIN 500 MG tablet Commonly known as: GLUCOPHAGE TAKE 2 TABLETS BY MOUTH TWICE DAILY WITH FOOD   multivitamin with minerals Tabs tablet Take 1 tablet by mouth in the morning.   mupirocin ointment 2 % Commonly known as: BACTROBAN Apply topically 2 (two) times daily. Place over ulcerated wound right leg for 10 days.   pantoprazole 40 MG tablet Commonly known as: Protonix Take 2 tablets (80 mg total) by mouth daily.   prasugrel 10 MG Tabs tablet Commonly known as: Effient Take 1 tablet (10 mg total) by mouth daily.   simvastatin 20 MG tablet Commonly known as: ZOCOR Take 1 tablet (20 mg total) by mouth at bedtime.   valsartan 40 MG tablet Commonly known as: DIOVAN Take 20 mg by mouth daily with supper.   VITAMIN B-12 PO Take 2,000 mcg by mouth in the morning.   Vitamin D3 50 MCG (2000 UT) Tabs Take 2,000 Units by mouth in the morning.               Durable Medical Equipment  (From admission, onward)           Start     Ordered   11/01/22 1130  For home use only DME oxygen  Once       Question Answer Comment  Length of Need 12 Months   Mode or (Route) Nasal cannula   Liters per Minute 2   Frequency Continuous (stationary and portable oxygen unit needed)   Oxygen conserving device Yes   Oxygen delivery system Gas      11/01/22 1129            Follow-up Information     [facility]. Go in 10 day(s).   Specialty: Cardiology Why: Hospital follow upo 11/12/2022 @ 3  pm PLEASE bring a current medication list to appointment FREE valet parking, Entrance C, off [employer] information: [address] [id number] mc [place] [address] [phone]        [facility] Follow up.   Why: Iantha Fallen)- [facility] office referral for [facility] needs Contact information: [address] [place] [address] [phone]                Discharge Exam: Filed Weights   10/29/22 [phone]  10/31/22 0605 11/01/22 0558  Weight: 82.6 kg 79.4 kg 79.7 kg   BP 131/66 (BP Location: Left Arm)   Pulse 76   Temp 97.8 F (36.6 C) (Oral)   Resp 20   Ht 6\' 2"  (1.88 m)   Wt 79.7 kg   SpO2 93%   BMI 22.56 kg/m   Patient is feeling well, no chest pain or edema, no PND, right lower extremity rash is improving.   Neurology awake and alert ENT with mild pallor Cardiovascular with S1 and S2 present and rhythmic with no gallops, rubs or murmurs Respiratory with scattered rales with no wheezing or rhonchi Abdomen with no distention  Trace right lower extremity edema with improvement of erythema, non tender to palpation.     Condition at discharge: stable  The results of significant diagnostics from this hospitalization (including imaging, microbiology, ancillary and laboratory) are listed below for reference.   Imaging Studies: ECHOCARDIOGRAM COMPLETE  Result Date: 10/30/2022    ECHOCARDIOGRAM REPORT   Patient Name:   MANVIK KOZLOFF Date of Exam: 10/30/2022 Medical Rec #:  [id number]        Height:       74.0 in Accession #:    [id number]       Weight:       182.0 lb Date of Birth:  1946-03-15         BSA:          2.088 m Patient Age:    77 years         BP:           100/54 mmHg Patient Gender: M                HR:           72 bpm. Exam Location:  Inpatient Procedure: 2D Echo, Cardiac Doppler and Color Doppler Indications:    CHF-Acute Systolic 428.21 / I50.21  History:        Patient has prior history of  Echocardiogram examinations, most                 recent 07/17/2021. CHF and Cardiomyopathy, CAD, AAA and PAD,                 Arrythmias:LBBB, Signs/Symptoms:Chest Pain; Risk                 Factors:Diabetes, Hypertension and Dyslipidemia.  Sonographer:    Aron Baba Referring Phys: [id number] JENNIFER YATES  Sonographer Comments: Suboptimal subcostal window. Image acquisition challenging due to respiratory motion. IMPRESSIONS  1. Left ventricular ejection fraction, by estimation, is 60 to 65%. The left ventricle has normal function. The left ventricle has no regional wall motion abnormalities. Left ventricular diastolic parameters are consistent with Grade I diastolic dysfunction (impaired relaxation).  2. Right ventricular systolic function is normal. The right ventricular size is mildly enlarged. There is normal pulmonary artery systolic pressure. The estimated right ventricular systolic pressure is 26.4 mmHg.  3. The mitral valve is grossly normal. Trivial mitral valve regurgitation. No evidence of mitral stenosis.  4. The aortic valve is tricuspid. Aortic valve regurgitation is not visualized. Aortic valve sclerosis/calcification is present, without any evidence of aortic stenosis.  5. The inferior vena cava is normal in size with greater than 50% respiratory variability, suggesting right atrial pressure of 3 mmHg. Comparison(s): Changes from prior study are noted. The left ventricular function has improved. FINDINGS  Left Ventricle: Left ventricular ejection fraction, by estimation, is 60 to  65%. The left ventricle has normal function. The left ventricle has no regional wall motion abnormalities. The left ventricular internal cavity size was normal in size. There is  no left ventricular hypertrophy. Left ventricular diastolic parameters are consistent with Grade I diastolic dysfunction (impaired relaxation). Right Ventricle: The right ventricular size is mildly enlarged. No increase in right ventricular wall  thickness. Right ventricular systolic function is normal. There is normal pulmonary artery systolic pressure. The tricuspid regurgitant velocity is 2.42  m/s, and with an assumed right atrial pressure of 3 mmHg, the estimated right ventricular systolic pressure is 26.4 mmHg. Left Atrium: Left atrial size was normal in size. Right Atrium: Right atrial size was normal in size. Pericardium: There is no evidence of pericardial effusion. Mitral Valve: The mitral valve is grossly normal. Trivial mitral valve regurgitation. No evidence of mitral valve stenosis. Tricuspid Valve: The tricuspid valve is grossly normal. Tricuspid valve regurgitation is trivial. No evidence of tricuspid stenosis. Aortic Valve: The aortic valve is tricuspid. Aortic valve regurgitation is not visualized. Aortic valve sclerosis/calcification is present, without any evidence of aortic stenosis. Pulmonic Valve: The pulmonic valve was grossly normal. Pulmonic valve regurgitation is trivial. No evidence of pulmonic stenosis. Aorta: The aortic root and ascending aorta are structurally normal, with no evidence of dilitation. Venous: The inferior vena cava is normal in size with greater than 50% respiratory variability, suggesting right atrial pressure of 3 mmHg. IAS/Shunts: The atrial septum is grossly normal. Additional Comments: A device lead is visualized in the right atrium and right ventricle.  LEFT VENTRICLE PLAX 2D LVIDd:         3.40 cm   Diastology LVIDs:         2.50 cm   LV e' medial:    7.51 cm/s LV PW:         0.80 cm   LV E/e' medial:  8.4 LV IVS:        0.70 cm   LV e' lateral:   8.38 cm/s LVOT diam:     2.15 cm   LV E/e' lateral: 7.5 LV SV:         77 LV SV Index:   37 LVOT Area:     3.63 cm  RIGHT VENTRICLE RV S prime:     10.40 cm/s TAPSE (M-mode): 1.9 cm LEFT ATRIUM             Index        RIGHT ATRIUM           Index LA diam:        2.70 cm 1.29 cm/m   RA Area:     20.30 cm LA Vol (A2C):   56.0 ml 26.82 ml/m  RA Volume:   64.10  ml  30.70 ml/m LA Vol (A4C):   55.3 ml 26.48 ml/m LA Biplane Vol: 56.4 ml 27.01 ml/m  AORTIC VALVE             PULMONIC VALVE LVOT Vmax:   101.00 cm/s PR End Diast Vel: 8.53 msec LVOT Vmean:  71.400 cm/s LVOT VTI:    0.213 m  AORTA Ao Root diam: 3.10 cm Ao Asc diam:  3.60 cm MITRAL VALVE                TRICUSPID VALVE MV Area (PHT): 3.16 cm     TR Peak grad:   23.4 mmHg MV Decel Time: 240 msec     TR Vmax:  242.00 cm/s MV E velocity: 63.00 cm/s MV A velocity: 106.00 cm/s  SHUNTS MV E/A ratio:  0.59         Systemic VTI:  0.21 m                             Systemic Diam: 2.15 cm Lennie Odor MD Electronically signed by Lennie Odor MD Signature Date/Time: 10/30/2022/2:42:09 PM    Final    DG Chest Port 1 View  Result Date: 10/29/2022 CLINICAL DATA:  Preoperative for right common femoral endarterectomy. [id number]. EXAM: PORTABLE CHEST 1 VIEW COMPARISON:  High-resolution chest CT 03/27/2022, portable chest 08/20/2021 FINDINGS: Upper zonal centrilobular emphysematous changes and basal-predominant UIP-pattern fibrosis are again noted with underlying subpleural ground-glass disease in the lower lung fields. The appearance is similar to the prior studies but the ground-glass disease does limit assessment for early infiltrates. No dense area consolidation or new infiltrate is seen. The cardiac size is normal. A left chest dual lead pacing system with AID wiring is again shown. The mediastinum is normally outlined. There is calcification of the transverse aorta. Osteopenia, degenerative changes and mild levoscoliosis thoracic spine. IMPRESSION: 1. Background mid to lower zonal subpleural fibrosis and ground-glass disease. No new lung opacity. 2. COPD and basal-predominant UIP-pattern fibrosis. 3. Aortic atherosclerosis. Electronically Signed   By: Almira Bar M.D.   On: 10/29/2022 07:47   CUP PACEART REMOTE DEVICE CHECK  Result Date: 10/24/2022 Scheduled remote reviewed. Normal device function.  Next  remote 91 days. Hassell Halim, RN, CCDS, [employer]  CARDIAC CATHETERIZATION  Result Date: 10/10/2022   Dist RCA lesion is 99% stenosed, functionally a chronic total occlusion.  A drug-eluting stent was successfully placed using a SYNERGY XD 2.50X24, postdilated to 2.9 mm and optimized with intravascular ultrasound.   Post intervention, there is a 0% residual stenosis.   Mid RCA lesion is 75% stenosed.  A drug-eluting stent was successfully placed using a SYNERGY XD 3.0X16, postdilated to 3.5 mm and optimized with intravascular ultrasound.   Post intervention, there is a 0% residual stenosis.   Prox RCA lesion is 75% stenosed.  Severely calcified.  After rotational atherectomy, A drug-eluting stent was successfully placed using a STENT MEGATRON 3.5X28, postdilated to > 3.5 mm and optimized with intravascular ultrasound.   Prox RCA to Mid RCA lesion is 25% stenosed with 25% stenosed side branch in RV Branch.   Post intervention, there is a 0% residual stenosis.   The left ventricular systolic function is normal.   LV end diastolic pressure is normal.   The left ventricular ejection fraction is 55-65% by visual estimate.   There is no aortic valve stenosis.   In the absence of any other complications or medical issues, we expect the patient to be ready for discharge from an interventional cardiology perspective on 10/11/2022.   Recommend uninterrupted dual antiplatelet therapy with Aspirin 81mg  daily and Ticagrelor 90mg  twice daily for given diffuse disease, would plan longer P2Y12 therapy. Successful CTO PCI of the RCA with stents placed in the distal, mid and proximal RCA.  The distal and mid stents overlap. The patient will be watched overnight.  Results conveyed to his wife. He continues to have a right foot wound with planned vascular surgery in a few weeks.  Will have wound care evaluate the patient in the hospital. Would plan for prolonged more intense antiplatelet therapy given his diffuse CAD and  multiple stents.  US Venous Img Lower Unilateral Right  Result Date: 10/05/2022 CLINICAL DATA:  Edema and color changes. EXAM: RIGHT LOWER EXTREMITY VENOUS DOPPLER ULTRASOUND TECHNIQUE: Gray-scale sonography with compression, as well as color and duplex ultrasound, were performed to evaluate the deep venous system(s) from the level of the common femoral vein through the popliteal and proximal calf veins. COMPARISON:  None Available. FINDINGS: VENOUS Normal compressibility of the common femoral, superficial femoral, and popliteal veins, as well as the visualized calf veins. Visualized portions of profunda femoral vein and great saphenous vein unremarkable. No filling defects to suggest DVT on grayscale or color Doppler imaging. Doppler waveforms show normal direction of venous flow, normal respiratory plasticity and response to augmentation. Limited views of the contralateral common femoral vein are unremarkable. OTHER None. Limitations: none IMPRESSION: Negative. Electronically Signed   By: Gerome Sam III M.D.   On: 10/05/2022 15:15    Microbiology: Results for orders placed or performed during the hospital encounter of 10/23/22  Surgical pcr screen     Status: Abnormal   Collection Time: 10/23/22  1:24 PM   Specimen: Nasal Mucosa; Nasal Swab  Result Value Ref Range Status   MRSA, PCR NEGATIVE NEGATIVE Final   Staphylococcus aureus POSITIVE (A) NEGATIVE Final    Comment: (NOTE) The Xpert SA Assay (FDA approved for NASAL specimens in patients 69 years of age and older), is one component of a comprehensive surveillance program. It is not intended to diagnose infection nor to guide or monitor treatment. Performed at [facility], [address] [place], [place] [address]     Labs: CBC: Recent Labs  Lab 10/29/22 [phone] 10/30/22 0133 10/31/22 0141 11/01/22 0116  WBC 10.1 9.5 9.9 9.7  NEUTROABS 7.6  --   --  6.9  HGB 13.3 12.8* 11.7* 13.1  HCT 40.0 38.4* 35.3* 39.8  MCV 98.5  95.3 96.4 97.5  PLT 326 313 308 318   Basic Metabolic Panel: Recent Labs  Lab 10/29/22 0906 10/30/22 0133 10/31/22 0141 11/01/22 0116  NA 141 136 137 139  K 4.4 4.1 3.3* 3.8  CL 110 101 104 105  CO2 19* 21* 23 21*  GLUCOSE 107* 199* 133* 187*  BUN 24* 24* 25* 23  CREATININE 1.15 1.50* 1.36* 1.16  CALCIUM 9.2 9.1 8.6* 9.1   Liver Function Tests: Recent Labs  Lab 10/29/22 0906  AST 27  ALT 21  ALKPHOS 83  BILITOT 0.9  PROT 6.6  ALBUMIN 3.5   CBG: Recent Labs  Lab 10/31/22 0627 10/31/22 1129 10/31/22 1635 10/31/22 2106 11/01/22 0559  GLUCAP 94 202* 155* 160* 133*    Discharge time spent: greater than 30 minutes.  Signed: Coralie Keens, MD [facility] 11/01/2022

## 2022-11-01 NOTE — Progress Notes (Signed)
Mobility Specialist Progress Note:  Nurse requested Mobility Specialist to perform oxygen saturation test with pt which includes removing pt from oxygen both at rest and while ambulating.  Below are the results from that testing.     Patient Saturations on Room Air at Rest = spO2 90%  Patient Saturations on Room Air while Ambulating = sp02 85% .    Patient Saturations on 2 Liters of oxygen while Ambulating = sp02 95%  At end of testing pt left in room on 2  Liters of oxygen.  Reported results to nurse.    Thompson Grayer Mobility Specialist  Please contact vis Secure Chat or  Rehab Office [phone]

## 2022-11-02 DIAGNOSIS — I2510 Atherosclerotic heart disease of native coronary artery without angina pectoris: Secondary | ICD-10-CM | POA: Diagnosis not present

## 2022-11-02 DIAGNOSIS — Z7982 Long term (current) use of aspirin: Secondary | ICD-10-CM | POA: Diagnosis not present

## 2022-11-02 DIAGNOSIS — Z95810 Presence of automatic (implantable) cardiac defibrillator: Secondary | ICD-10-CM | POA: Diagnosis not present

## 2022-11-02 DIAGNOSIS — J84112 Idiopathic pulmonary fibrosis: Secondary | ICD-10-CM | POA: Diagnosis not present

## 2022-11-02 DIAGNOSIS — L97519 Non-pressure chronic ulcer of other part of right foot with unspecified severity: Secondary | ICD-10-CM | POA: Diagnosis not present

## 2022-11-02 DIAGNOSIS — E1165 Type 2 diabetes mellitus with hyperglycemia: Secondary | ICD-10-CM | POA: Diagnosis not present

## 2022-11-02 DIAGNOSIS — Z7901 Long term (current) use of anticoagulants: Secondary | ICD-10-CM | POA: Diagnosis not present

## 2022-11-02 DIAGNOSIS — N189 Chronic kidney disease, unspecified: Secondary | ICD-10-CM | POA: Diagnosis not present

## 2022-11-02 DIAGNOSIS — L03115 Cellulitis of right lower limb: Secondary | ICD-10-CM | POA: Diagnosis not present

## 2022-11-02 DIAGNOSIS — I5023 Acute on chronic systolic (congestive) heart failure: Secondary | ICD-10-CM | POA: Diagnosis not present

## 2022-11-02 DIAGNOSIS — I739 Peripheral vascular disease, unspecified: Secondary | ICD-10-CM | POA: Diagnosis not present

## 2022-11-02 DIAGNOSIS — I130 Hypertensive heart and chronic kidney disease with heart failure and stage 1 through stage 4 chronic kidney disease, or unspecified chronic kidney disease: Secondary | ICD-10-CM | POA: Diagnosis not present

## 2022-11-02 DIAGNOSIS — E11621 Type 2 diabetes mellitus with foot ulcer: Secondary | ICD-10-CM | POA: Diagnosis not present

## 2022-11-02 DIAGNOSIS — Z794 Long term (current) use of insulin: Secondary | ICD-10-CM | POA: Diagnosis not present

## 2022-11-02 DIAGNOSIS — I4891 Unspecified atrial fibrillation: Secondary | ICD-10-CM | POA: Diagnosis not present

## 2022-11-02 DIAGNOSIS — I25118 Atherosclerotic heart disease of native coronary artery with other forms of angina pectoris: Secondary | ICD-10-CM | POA: Diagnosis not present

## 2022-11-02 DIAGNOSIS — Z7984 Long term (current) use of oral hypoglycemic drugs: Secondary | ICD-10-CM | POA: Diagnosis not present

## 2022-11-02 DIAGNOSIS — Z9981 Dependence on supplemental oxygen: Secondary | ICD-10-CM | POA: Diagnosis not present

## 2022-11-02 DIAGNOSIS — E1122 Type 2 diabetes mellitus with diabetic chronic kidney disease: Secondary | ICD-10-CM | POA: Diagnosis not present

## 2022-11-04 ENCOUNTER — Telehealth: Payer: Self-pay

## 2022-11-04 ENCOUNTER — Ambulatory Visit: Payer: Self-pay

## 2022-11-04 ENCOUNTER — Telehealth: Payer: Self-pay | Admitting: *Deleted

## 2022-11-04 NOTE — Transitions of Care (Post Inpatient/ED Visit) (Signed)
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 Sig Documenting Provider Last Dose Status Informant  albuterol (VENTOLIN HFA) 108 (90 Base) MCG/ACT inhaler [id number] Yes Inhale 2 puffs into the lungs every 6 (six) hours as needed for wheezing or shortness of breath. Kalman Shan, MD Taking Active Self  amLODipine (NORVASC) 10 MG tablet [id number] Yes TAKE 1 TABLET(10 MG) BY MOUTH DAILY Jake Bathe, MD Taking Active Self  aspirin EC 81 MG tablet [id number] Yes Take 1 tablet (81 mg total) by mouth daily. Abagail Kitchens, PA-C Taking Active Self  carvedilol (COREG) 25 MG tablet [id number] Yes TAKE 1 TABLET BY MOUTH TWICE DAILY WITH MEALS Jake Bathe, MD Taking Active Self  cetirizine (ZYRTEC) 10 MG tablet [id number] No Take 1 tablet (10 mg total) by mouth daily.  Patient not taking: Reported on 11/04/2022   Alver Sorrow, NP Not Taking Active   Cholecalciferol (VITAMIN D3) 50 MCG (2000 UT) TABS [id number] Yes Take 2,000 Units by  mouth in the morning. [provider] Taking Active Self  Cyanocobalamin (VITAMIN B-12 PO) [id number] Yes Take 2,000 mcg by mouth in the morning. [provider] Taking Active Self           Med Note Daphine Deutscher, LINDA   Tue Sep 11, 2021 10:57 AM)    doxycycline (VIBRA-TABS) 100 MG tablet [id number] Yes Take 1 tablet (100 mg total) by mouth every 12 (twelve) hours for 10 days. Arrien, York Ram, MD Taking Active   furosemide (LASIX) 40 MG tablet [id number] Yes Take 1 tablet (40 mg total) by mouth daily as needed for edema or fluid (weight 178 lbs or higher.). Arrien, York Ram, MD Taking Active   glipiZIDE (GLUCOTROL) 5 MG tablet [id number] Yes Take 1 tablet (5 mg total) by mouth 2 (two) times daily before a meal. [name], MD Taking Active   glucose blood (ACCU-CHEK AVIVA PLUS) test strip [id number] Yes AS DIRECTED TO CHECK BLOOD SUGAR TWICE DAILY [name], MD Taking Active Self  insulin glargine (LANTUS SOLOSTAR) 100 UNIT/ML Solostar Pen [id number] Yes INJECT 0.3 TO 0.35 MLS(30 TO 35 UNITS) INTO THE SKIN EVERY DAY [name], MD Taking Active Self           Med Note Nanetta Batty Oct 17, 2022 10:33 AM)    isosorbide mononitrate (IMDUR) 30 MG 24 hr tablet [id number] Yes Take 1 tablet (30 mg total) by mouth daily. [name], MD Taking Active Self  metFORMIN (GLUCOPHAGE) 500 MG tablet [id number] Yes TAKE 2 TABLETS BY MOUTH TWICE DAILY WITH FOOD [name], MD Taking Active Self  Multiple Vitamin (MULTIVITAMIN WITH MINERALS) TABS tablet [id number] Yes Take 1 tablet by mouth in the morning. [provider] Taking Active Self  mupirocin ointment (BACTROBAN) 2 % [id number] Yes Apply topically 2 (two) times daily. Place over ulcerated wound right leg for 10 days. Arrien, York Ram, MD Taking Active   pantoprazole (PROTONIX) 40 MG tablet [id number] Yes Take 2 tablets (80 mg total) by mouth daily. Abagail Kitchens, PA-C Taking Active Self   prasugrel (EFFIENT) 10 MG TABS tablet [id number] Yes Take 1 tablet (10 mg total) by mouth daily. Alver Sorrow, NP Taking Active   simvastatin (ZOCOR) 20 MG tablet [id number] Yes Take 1 tablet (20 mg total) by mouth at bedtime. Alver Sorrow, NP Taking Active   umeclidinium bromide (INCRUSE ELLIPTA) 62.5 MCG/ACT AEPB [id number] Yes Inhale 1 puff into the lungs daily. Kalman Shan, MD Taking Active Self  valsartan (DIOVAN) 40 MG tablet [id number] Yes Take 20 mg by mouth daily with supper. [provider] Taking Active Self            Home Care and Equipment/Supplies: Were Home Health Services Ordered?: Yes Name of Home Health Agency:: [facility] Has Agency set up a time to come to your home?: Yes First Home Health Visit Date: 11/04/22 Any new equipment or medical supplies ordered?: Yes Name of Medical supply agency?: [facility] Were you able to get the equipment/medical supplies?: Yes Do you have any questions related to the use of the equipment/supplies?: No  Functional Questionnaire: Do you need assistance with bathing/showering or dressing?: Yes Do you need assistance with meal preparation?: Yes Do you need assistance with eating?: No Do you have difficulty maintaining continence: No Do you need assistance with getting out of bed/getting out of a chair/moving?: No Do you have difficulty managing or taking your medications?: No  Follow up appointments reviewed: PCP Follow-up appointment confirmed?: Yes Date of PCP follow-up appointment?: 11/08/22 Follow-up Provider: Dr Para March [facility] Follow-up appointment confirmed?: Yes Date of Specialist follow-up appointment?: 11/12/22 Follow-Up Specialty Provider:: [facility], Dr Graciela Husbands [id number] Do you need transportation to your follow-up appointment?: No Do you understand care options if your condition(s) worsen?: Yes-patient verbalized understanding  SDOH Interventions Today    Flowsheet Row Most  Recent Value  SDOH Interventions   Food Insecurity Interventions Intervention Not Indicated  Housing Interventions Intervention Not Indicated  Transportation Interventions Intervention Not Indicated, Patient Resources (Friends/Family)      Interventions Today    Flowsheet Row Most Recent Value  General Interventions   General Interventions Discussed/Reviewed General Interventions Discussed, General Interventions Reviewed, Doctor Visits  Pharmacy Interventions   Pharmacy Dicussed/Reviewed Pharmacy Topics Discussed, Pharmacy Topics Reviewed      . Interventions Today    Flowsheet Row Most Recent Value  General Interventions   General Interventions Discussed/Reviewed General Interventions Discussed, General Interventions Reviewed, Doctor Visits  Pharmacy Interventions   Pharmacy Dicussed/Reviewed Pharmacy Topics Discussed, Pharmacy Topics Reviewed      TOC Interventions Today    Flowsheet Row Most Recent Value  TOC Interventions   TOC Interventions Discussed/Reviewed TOC Interventions Discussed, TOC Interventions Reviewed, Arranged PCP follow up within 7 days/Care Guide scheduled       Gean Maidens BSN RN [facility] [phone]

## 2022-11-04 NOTE — Telephone Encounter (Signed)
Gretchen PT with [facility] called requesting verbal orders for 1 wk x 1, 2 wk x 2, and 3 wk x 3. She also requested to add a nursing evaluation for pt's skin.  Reviewed pt's chart, returned call for clarification, two identifiers used. Informed her that Dr. Randie Heinz had canceled the surgery. She stated that VVS had been put on the orders.  Checked with Doyne Keel, RN who stated that [place], [place] had ok'd the orders on Friday, when [place], area Production designer, theatre/television/film, was in the office.  Called [place] and verbal orders given.

## 2022-11-05 DIAGNOSIS — N189 Chronic kidney disease, unspecified: Secondary | ICD-10-CM | POA: Diagnosis not present

## 2022-11-05 DIAGNOSIS — I130 Hypertensive heart and chronic kidney disease with heart failure and stage 1 through stage 4 chronic kidney disease, or unspecified chronic kidney disease: Secondary | ICD-10-CM | POA: Diagnosis not present

## 2022-11-05 DIAGNOSIS — E1122 Type 2 diabetes mellitus with diabetic chronic kidney disease: Secondary | ICD-10-CM | POA: Diagnosis not present

## 2022-11-05 DIAGNOSIS — Z794 Long term (current) use of insulin: Secondary | ICD-10-CM | POA: Diagnosis not present

## 2022-11-05 DIAGNOSIS — Z7984 Long term (current) use of oral hypoglycemic drugs: Secondary | ICD-10-CM | POA: Diagnosis not present

## 2022-11-05 DIAGNOSIS — I5023 Acute on chronic systolic (congestive) heart failure: Secondary | ICD-10-CM | POA: Diagnosis not present

## 2022-11-05 NOTE — Chronic Care Management (AMB) (Signed)
     Luke [name] 1945/12/11 [id number]   Reason for Encounter: Patient is not currently enrolled in the CCM program. CCM enrollment status changed to "Previously enrolled"   [facility]/Chronic Care Management [phone]

## 2022-11-06 ENCOUNTER — Telehealth: Payer: Self-pay

## 2022-11-06 NOTE — Telephone Encounter (Signed)
Verlon Au, RN with [phone] [facility] called stating that the pt has ulcers on his RLE with weeping and redness. She was requesting an update on any wound care orders.  Reviewed pt's chart, returned call for clarification, two identifiers used. Informed her that the pt's surgery had been canceled and he has an appt for 7/10 to discuss. There are no official wound care orders. Requested that she use her best judgement, but to ensure he keeps his legs clean to avoid any infection. She stated that any wound care would be of no use if he needed revascularization, so she would wait until after his appt on 7/10.

## 2022-11-06 NOTE — Telephone Encounter (Signed)
[place], PT with [facility] called stating that the pt's wife wanted to hold off on PT since he has not had surgery yet. She wanted to make this office aware.

## 2022-11-08 ENCOUNTER — Encounter: Payer: Self-pay | Admitting: Family Medicine

## 2022-11-08 ENCOUNTER — Ambulatory Visit (INDEPENDENT_AMBULATORY_CARE_PROVIDER_SITE_OTHER): Payer: Medicare Other | Admitting: Family Medicine

## 2022-11-08 VITALS — BP 110/60 | HR 78 | Temp 98.5°F | Ht 74.0 in | Wt 176.0 lb

## 2022-11-08 DIAGNOSIS — L03119 Cellulitis of unspecified part of limb: Secondary | ICD-10-CM | POA: Diagnosis not present

## 2022-11-08 LAB — CBC WITH DIFFERENTIAL/PLATELET
Basophils Relative: 1 %
Neutro Abs: 11582 cells/uL — ABNORMAL HIGH (ref 1500–7800)
Platelets: 399 10*3/uL (ref 140–400)
RDW: 12.9 % (ref 11.0–15.0)

## 2022-11-08 MED ORDER — PANTOPRAZOLE SODIUM 40 MG PO TBEC: 40.0000 mg | DELAYED_RELEASE_TABLET | Freq: Every day | ORAL | Status: DC

## 2022-11-08 NOTE — Progress Notes (Unsigned)
He can tolerate simvastatin and amlodipine.  Discussed.  Multiple med intolerances/allergies discussed with patient.  Skin on his trunk is clearly better in the meantime.    He is monitoring his O2 sats at baseline.  Not SOB.  Using O2 ~8 hours at a time, at night.  Not on O2 at OV.    No fevers.  On doxycycline currently for the erythema in his right leg and he has vascular follow-up pending.  Meds, vitals, and allergies reviewed.   ROS: Per HPI unless specifically indicated in ROS section   [place] w/o LA Rrr Ctab Diffuse blanching erythema (improved recently per patient report) with flaking skin on the trunk.  No ulceration.   He has a separate issue with geographic erythema and dead skin on the R foot and shin, with blanching erythema.  Not puffy.

## 2022-11-08 NOTE — Patient Instructions (Signed)
Go to the lab on the way out.   If you have mychart we'll likely use that to update you.    Take care.  Glad to see you.  Elevated your right foot when possible.  Change the bandage daily or more often if wet.  Use a loose dry clean soft sock.    Finish the antibiotics.

## 2022-11-09 LAB — CBC WITH DIFFERENTIAL/PLATELET
Absolute Monocytes: 1193 cells/uL — ABNORMAL HIGH (ref 200–950)
Basophils Absolute: 151 cells/uL (ref 0–200)
Eosinophils Absolute: 423 cells/uL (ref 15–500)
Eosinophils Relative: 2.8 %
HCT: 38.8 % (ref 38.5–50.0)
Hemoglobin: 12.9 g/dL — ABNORMAL LOW (ref 13.2–17.1)
Lymphs Abs: 1752 cells/uL (ref 850–3900)
MCH: 32.7 pg (ref 27.0–33.0)
MCHC: 33.2 g/dL (ref 32.0–36.0)
MCV: 98.5 fL (ref 80.0–100.0)
MPV: 9.9 fL (ref 7.5–12.5)
Monocytes Relative: 7.9 %
Neutrophils Relative %: 76.7 %
RBC: 3.94 10*6/uL — ABNORMAL LOW (ref 4.20–5.80)
Total Lymphocyte: 11.6 %
WBC: 15.1 10*3/uL — ABNORMAL HIGH (ref 3.8–10.8)

## 2022-11-09 LAB — COMPREHENSIVE METABOLIC PANEL
AG Ratio: 1.3 (calc) (ref 1.0–2.5)
ALT: 19 U/L (ref 9–46)
AST: 21 U/L (ref 10–35)
Albumin: 3.9 g/dL (ref 3.6–5.1)
Alkaline phosphatase (APISO): 87 U/L (ref 35–144)
BUN/Creatinine Ratio: 22 (calc) (ref 6–22)
BUN: 31 mg/dL — ABNORMAL HIGH (ref 7–25)
CO2: 21 mmol/L (ref 20–32)
Calcium: 9.9 mg/dL (ref 8.6–10.3)
Chloride: 99 mmol/L (ref 98–110)
Creat: 1.4 mg/dL — ABNORMAL HIGH (ref 0.70–1.28)
Globulin: 2.9 g/dL (calc) (ref 1.9–3.7)
Glucose, Bld: 187 mg/dL — ABNORMAL HIGH (ref 65–99)
Potassium: 5 mmol/L (ref 3.5–5.3)
Sodium: 137 mmol/L (ref 135–146)
Total Bilirubin: 0.5 mg/dL (ref 0.2–1.2)
Total Protein: 6.8 g/dL (ref 6.1–8.1)

## 2022-11-11 ENCOUNTER — Ambulatory Visit: Payer: Medicare Other | Attending: Internal Medicine

## 2022-11-11 ENCOUNTER — Telehealth (HOSPITAL_COMMUNITY): Payer: Self-pay

## 2022-11-11 DIAGNOSIS — Z95810 Presence of automatic (implantable) cardiac defibrillator: Secondary | ICD-10-CM

## 2022-11-11 DIAGNOSIS — I5042 Chronic combined systolic (congestive) and diastolic (congestive) heart failure: Secondary | ICD-10-CM

## 2022-11-11 NOTE — Telephone Encounter (Signed)
Called to confirm Heart & Vascular Transitions of Care appointment. Patient reminded to bring all medications and pill box organizer with them. Confirmed patient has transportation. Gave directions, instructed to utilize valet parking.  Confirmed appointment prior to ending call.   

## 2022-11-11 NOTE — Assessment & Plan Note (Signed)
I think it makes sense to recheck his labs today, continue with doxycycline.  We did not want to put an occlusive dressing on his leg.  I want him to be able to inspect the skin.  I put a nonstick ABD on the top of his foot since he had some weeping of the tissue.  He can replace that as needed to keep it clean and dry.  We covered that with a sock to keep it in place.  He is going to follow-up with cardiology and vascular surgery in the meantime.  He does not have a fever and is okay for outpatient follow-up.  This appears to be separate from his rash on his trunk which is still noted but per patient is improving.  We talked about his meds and allergies.  At this point it appears he can continue with his medications as listed.   30 minutes were devoted to patient care in this encounter (this includes time spent reviewing the patient's file/history, interviewing and examining the patient, counseling/reviewing plan with patient).

## 2022-11-12 ENCOUNTER — Ambulatory Visit (HOSPITAL_COMMUNITY)
Admit: 2022-11-12 | Discharge: 2022-11-12 | Disposition: A | Discharge status: Home / Self Care | Payer: Medicare Other | Source: Ambulatory Visit | Attending: Cardiology | Admitting: Cardiology

## 2022-11-12 ENCOUNTER — Encounter (HOSPITAL_COMMUNITY): Payer: Self-pay

## 2022-11-12 VITALS — BP 128/68 | HR 78 | Wt 175.8 lb

## 2022-11-12 DIAGNOSIS — I5040 Unspecified combined systolic (congestive) and diastolic (congestive) heart failure: Secondary | ICD-10-CM | POA: Diagnosis not present

## 2022-11-12 DIAGNOSIS — Z955 Presence of coronary angioplasty implant and graft: Secondary | ICD-10-CM | POA: Insufficient documentation

## 2022-11-12 DIAGNOSIS — I130 Hypertensive heart and chronic kidney disease with heart failure and stage 1 through stage 4 chronic kidney disease, or unspecified chronic kidney disease: Secondary | ICD-10-CM | POA: Diagnosis not present

## 2022-11-12 DIAGNOSIS — I4891 Unspecified atrial fibrillation: Secondary | ICD-10-CM | POA: Insufficient documentation

## 2022-11-12 DIAGNOSIS — Z8249 Family history of ischemic heart disease and other diseases of the circulatory system: Secondary | ICD-10-CM | POA: Diagnosis not present

## 2022-11-12 DIAGNOSIS — E1151 Type 2 diabetes mellitus with diabetic peripheral angiopathy without gangrene: Secondary | ICD-10-CM | POA: Diagnosis not present

## 2022-11-12 DIAGNOSIS — Z87891 Personal history of nicotine dependence: Secondary | ICD-10-CM | POA: Diagnosis not present

## 2022-11-12 DIAGNOSIS — I5030 Unspecified diastolic (congestive) heart failure: Secondary | ICD-10-CM

## 2022-11-12 DIAGNOSIS — I447 Left bundle-branch block, unspecified: Secondary | ICD-10-CM | POA: Diagnosis not present

## 2022-11-12 DIAGNOSIS — E785 Hyperlipidemia, unspecified: Secondary | ICD-10-CM | POA: Insufficient documentation

## 2022-11-12 DIAGNOSIS — Z794 Long term (current) use of insulin: Secondary | ICD-10-CM | POA: Diagnosis not present

## 2022-11-12 DIAGNOSIS — Z7984 Long term (current) use of oral hypoglycemic drugs: Secondary | ICD-10-CM | POA: Diagnosis not present

## 2022-11-12 DIAGNOSIS — Z95810 Presence of automatic (implantable) cardiac defibrillator: Secondary | ICD-10-CM | POA: Diagnosis not present

## 2022-11-12 DIAGNOSIS — Z833 Family history of diabetes mellitus: Secondary | ICD-10-CM | POA: Diagnosis not present

## 2022-11-12 DIAGNOSIS — E1122 Type 2 diabetes mellitus with diabetic chronic kidney disease: Secondary | ICD-10-CM | POA: Diagnosis not present

## 2022-11-12 DIAGNOSIS — I2510 Atherosclerotic heart disease of native coronary artery without angina pectoris: Secondary | ICD-10-CM | POA: Diagnosis not present

## 2022-11-12 DIAGNOSIS — N1831 Chronic kidney disease, stage 3a: Secondary | ICD-10-CM | POA: Diagnosis not present

## 2022-11-12 DIAGNOSIS — Z79899 Other long term (current) drug therapy: Secondary | ICD-10-CM | POA: Diagnosis not present

## 2022-11-12 NOTE — Progress Notes (Signed)
EPIC Encounter for ICM Monitoring  Patient Name: Luke [name] is a 77 y.o. male Date: 11/12/2022 Primary Care Physican: [name], MD Primary Cardiologist: Anne Fu Electrophysiologist: Joycelyn Schmid Pacing: >99%  7//01/2023 Weight: 170 lbs (baseline 174 lbs)       ICM Intro (ref by Dr Royann Shivers after being seen in hospital) and he agreed to monthly follow up.  Heart Failure questions reviewed.  Pt reports leg swelling and weeping and has follow up appointment this afternoon.  He is waiting for surgery to be scheduled since he does not have adequate blood flow to that leg.  He is taking PRN Furosemide based on swelling and weight.  His baseline line weight at time of hospital discharge is 174-175 lbs and is weighing dailyyyy.     CorVue thoracic impedance suggesting the start of possible fluid accumulation 7/7.  Also decreased impedance 6/9-6/26 (resulted in hospitalization).   Prescribed:  Furosemide 40 mg take 1 tablet(s) (40 mg total) by mouth daily as needed for edema or fluid (weight 178 lbs or higher).  Labs: 11/08/2022 Creatinine 1.40, BUN 31, Potassium 5.0, Sodium 137  11/01/2022 Creatinine 1.16, BUN 23, Potassium 3.8, Sodium 139, GFR >60  10/31/2022 Creatinine 1.33, BUN 23, Potassium 3.3, Sodium 137, GFR 54  10/30/2022 Creatinine 1.50, BUN 24, Potassium 4.4, Sodium 41, GFR >60 10/29/2022 Creatinine 1.15, BUN 24, Potassium 4.4, Sodium 141, GFR >60  10/23/2022 Creatinine 1.21, BUN 27, Potassium 4.7, Sodium 136, GFR >60  10/11/2022 Creatinine 1.15, BUN 25, Potassium 3.9, Sodium 138, GFR >60  10/05/2022 Creatinine 1.08, BUN 24, Potassium 4.2, Sodium 136, GFR >60  A complete set of results can be found in Results Review.  Recommendations: Pt is taking PRN  40 mg daily with the exception of 7/7-7/8 but restarted today after legs began weeping again.    Follow-up plan: ICM clinic phone appointment on 11/18/2022 to recheck fluid levels.   91 day device clinic remote transmission  01/22/2023.    EP/Cardiology Office Visits: 12/17/2022 with Dr. Graciela Husbands  01/23/2023 with Dr Anne Fu.    Copy of ICM check sent to Dr. Graciela Husbands.   3 month ICM trend: 11/11/2022.    12-14 Month ICM trend:     Karie Soda, RN 11/12/2022 9:46 AM

## 2022-11-12 NOTE — Patient Instructions (Signed)
Medication Changes:  None  Lab Work:  None  Testing/Procedures:  None  Referrals:  None  Special Instructions // Education:  Do the following things EVERYDAY: Weigh yourself in the morning before breakfast. Write it down and keep it in a log. Take your medicines as prescribed Eat low salt foods--Limit salt (sodium) to 2000 mg per day.  Stay as active as you can everyday Limit all fluids for the day to less than 2 liters   Follow-Up in: Thank you for allowing [place] to provider your heart failure care after your recent hospitalization. Please follow-up with Cardiology as scheduled.

## 2022-11-12 NOTE — Progress Notes (Signed)
HEART & VASCULAR TRANSITION OF CARE CONSULT NOTE     Referring Physician: Dr. Ella Jubilee  Primary Care: [name], MD  Primary Cardiologist: Dr. Anne Fu EP: Dr. Graciela Husbands    HPI: Referred to clinic by Dr. Ella Jubilee for heart failure consultation.   77 y/o male w/ h/o abdominal aortic aneurysm, atrial fibrillation, h/o combined systolic and diastolic heart failure, HFrEF>>HFimpEF, CAD, LBBB s/p ICD, CKDIIIa, T2DM, hyperlipidemia, pulmonary fibrosis, and peripheral vascular disease. EF previously as low as 30% in 2013. LHC at that time w/ CAD but nonobstructive. EF did not initially improve w/ medical therapy. Underwent BiV ICD implant in 2014. EF improved to 55-60% on echo in 2019.  He was recently referred for outpatient [facility] 5/24 for progressive exertional SOB and chest pain. Cath showed 80% prox-mid LAD and CTO of distal RCA, 75% prox RCA and 50% mRCA lesions. He initially underwent PCI + placement of DES to LAD, followed by staged CTO PCI of the RCA 6/24, with stents placed in the distal, mid and proximal RCA.  The distal and mid stents overlap.   Most recent echo 6/24 showed normal LVEF 60-65%, w/ GIDD and normal RV.   He also has significant LE PVD, followed by Dr. Randie Heinz, and was scheduled for elective rt femoral enterectomy on 10/29/22, however surgery was canceled given development of acute CHF w/ volume overload and LE cellulitis. He was admitted under hospitalitis service and placed on abx. Cardiology was consulted and assisted w/ HF management. He diuresed w/ IV Lasix and was instructed at d/c to take lasix PRN for wt gain > 178 lb. Plan was to f/u w/ [facility] as outpatient and reschedule LE revascularization once clinically stable. He was referred to [facility] at discharge. Also enrolled in [facility] heart failure device program. Of note, remote device interrogation was done post hospital and CorVue thoracic impedance demonstrated the start of possible fluid accumulation since 7/7. He has since started  taking lasix daily.  He now presents for f/u. Here w/ wife. Reports LEE and weeping has resolved w/ daily lasix. Device interrogation showed increase in thoracic impedence showing improvement in volume status. Wt 175 lb in clinic today. Also down on home scale. Denies resting dyspnea. No exertional dyspnea. Denies CP. BP well controlled. Scheduled for return f/u w/ [facility] tomorrow. Has completed abx for cellulitis. No fever or chills. He has had f/u labs done at PCP office. I have personally reviewed. SCr was stable at 1.40 c/w baseline. K 5.0.    Cardiac Testing   LHC 5/24    Mid RCA to Dist RCA lesion is 50% stenosed.   Prox RCA lesion is 75% stenosed.   Mid RCA lesion is 25% stenosed.   Dist RCA lesion is 99% stenosed.   Prox LAD to Mid LAD lesion is 80% stenosed.   After IVL with 3.5 x 12mm Shockwave,  drug-eluting stent was successfully placed using a SYNERGY XD 3.50X12, postdilated to > 3.75 mm and optimized with IVUS.   Post intervention, there is a 0% residual stenosis.   The left ventricular systolic function is normal.   LV end diastolic pressure is normal.   The left ventricular ejection fraction is 55-65% by visual estimate.   There is no aortic valve stenosis.   Successful IVL followed by stent placement of LAD.   CTO of distal RCA, now with a channel which may facilitate CTO PCI    [facility] 6/24    Dist RCA lesion is 99% stenosed, functionally a  chronic total occlusion.  A drug-eluting stent was successfully placed using a SYNERGY XD 2.50X24, postdilated to 2.9 mm and optimized with intravascular ultrasound.   Post intervention, there is a 0% residual stenosis.   Mid RCA lesion is 75% stenosed.  A drug-eluting stent was successfully placed using a SYNERGY XD 3.0X16, postdilated to 3.5 mm and optimized with intravascular ultrasound.   Post intervention, there is a 0% residual stenosis.   Prox RCA lesion is 75% stenosed.  Severely calcified.  After rotational atherectomy, A  drug-eluting stent was successfully placed using a STENT MEGATRON 3.5X28, postdilated to > 3.5 mm and optimized with intravascular ultrasound.   Prox RCA to Mid RCA lesion is 25% stenosed with 25% stenosed side branch in RV Branch.   Post intervention, there is a 0% residual stenosis.   The left ventricular systolic function is normal.   LV end diastolic pressure is normal.   The left ventricular ejection fraction is 55-65% by visual estimate.   There is no aortic valve stenosis.   In the absence of any other complications or medical issues, we expect the patient to be ready for discharge from an interventional cardiology perspective on 10/11/2022.   Recommend uninterrupted dual antiplatelet therapy with Aspirin 81mg  daily and Ticagrelor 90mg  twice daily for given diffuse disease, would plan longer P2Y12 therapy.   Successful CTO PCI of the RCA with stents placed in the distal, mid and proximal RCA.  The distal and mid stents overlap.  2D Echo 6/24  1. Left ventricular ejection fraction, by estimation, is 60 to 65%. The  left ventricle has normal function. The left ventricle has no regional  wall motion abnormalities. Left ventricular diastolic parameters are  consistent with Grade I diastolic  dysfunction (impaired relaxation).   2. Right ventricular systolic function is normal. The right ventricular  size is mildly enlarged. There is normal pulmonary artery systolic  pressure. The estimated right ventricular systolic pressure is 26.4 mmHg.   3. The mitral valve is grossly normal. Trivial mitral valve  regurgitation. No evidence of mitral stenosis.   4. The aortic valve is tricuspid. Aortic valve regurgitation is not  visualized. Aortic valve sclerosis/calcification is present, without any  evidence of aortic stenosis.   5. The inferior vena cava is normal in size with greater than 50%  respiratory variability, suggesting right atrial pressure of 3 mmHg.    Review of Systems: [y] = yes,  [ ]  = no   General: Weight gain [ ] ; Weight loss [ ] ; Anorexia [ ] ; Fatigue [ ] ; Fever [ ] ; Chills [ ] ; Weakness [ ]   Cardiac: Chest pain/pressure [ ] ; Resting SOB [ ] ; Exertional SOB [ ] ; Orthopnea [ ] ; Pedal Edema [ ] ; Palpitations [ ] ; Syncope [ ] ; Presyncope [ ] ; Paroxysmal nocturnal dyspnea[ ]   Pulmonary: Cough [ ] ; Wheezing[ ] ; Hemoptysis[ ] ; Sputum [ ] ; Snoring [ ]   GI: Vomiting[ ] ; Dysphagia[ ] ; Melena[ ] ; Hematochezia [ ] ; Heartburn[ ] ; Abdominal pain [ ] ; Constipation [ ] ; Diarrhea [ ] ; BRBPR [ ]   GU: Hematuria[ ] ; Dysuria [ ] ; Nocturia[ ]   Vascular: Pain in legs with walking [ Y]; Pain in feet with lying flat [ Y]; Non-healing sores [ Y]; Stroke [ ] ; TIA [ ] ; Slurred speech [ ] ;  Neuro: Headaches[ ] ; Vertigo[ ] ; Seizures[ ] ; Paresthesias[ ] ;Blurred vision [ ] ; Diplopia [ ] ; Vision changes [ ]   Ortho/Skin: Arthritis [ ] ; Joint pain [ ] ; Muscle pain [ ] ; Joint swelling [ ] ; Back  Pain [ ] ; Rash [ ]   Psych: Depression[ ] ; Anxiety[ ]   Heme: Bleeding problems [ ] ; Clotting disorders [ ] ; Anemia [ ]   Endocrine: Diabetes [ ] ; Thyroid dysfunction[ ]    Past Medical History:  Diagnosis Date   AAA (abdominal aortic aneurysm) (HCC) 2011   Per vascular surgery   Atrial fibrillation (HCC)    CAD (coronary artery disease)    Presumed CAD with nuclear scan October 09, 2011,  large anteroseptal MI and inferior MI. Catheterization scheduled October 15, 2011   Cardiomyopathy [facility])    Nuclear, October 09, 2011, EF 30%, multiple focal wall motion abnormalities   Chronic kidney disease    CKD3   Diabetes mellitus    type II   GERD (gastroesophageal reflux disease)    Silent   HLD (hyperlipidemia)    Hypertension    white coat HTN-- often elevated in office and controlled on outside checks.   ICD (implantable cardioverter-defibrillator) in place    CRT-D placed March, 2014 complete heart block and k dysfunction   IPF (idiopathic pulmonary fibrosis) (HCC)    LBBB (left bundle branch block)    LBBB on EKG  October 11, 2011,  no prior EKG has been done   Low testosterone    Hx of   Pacemaker    PAD (peripheral artery disease) (HCC)    Pancreatitis    Umbilical hernia     Current Outpatient Medications  Medication Sig Dispense Refill   albuterol (VENTOLIN HFA) 108 (90 Base) MCG/ACT inhaler Inhale 2 puffs into the lungs every 6 (six) hours as needed for wheezing or shortness of breath. 8 g 6   amLODipine (NORVASC) 10 MG tablet TAKE 1 TABLET(10 MG) BY MOUTH DAILY 90 tablet 3   aspirin EC 81 MG tablet Take 1 tablet (81 mg total) by mouth daily. 120 tablet 2   carvedilol (COREG) 25 MG tablet TAKE 1 TABLET BY MOUTH TWICE DAILY WITH MEALS 180 tablet 3   cetirizine (ZYRTEC) 10 MG tablet Take 1 tablet (10 mg total) by mouth daily. 30 tablet 0   Cholecalciferol (VITAMIN D3) 50 MCG (2000 UT) TABS Take 2,000 Units by mouth in the morning.     Cyanocobalamin (VITAMIN B-12 PO) Take 2,000 mcg by mouth in the morning.     furosemide (LASIX) 40 MG tablet Take 1 tablet (40 mg total) by mouth daily as needed for edema or fluid (weight 178 lbs or higher.). 30 tablet 0   glipiZIDE (GLUCOTROL) 5 MG tablet Take 1 tablet (5 mg total) by mouth 2 (two) times daily before a meal.     glucose blood (ACCU-CHEK AVIVA PLUS) test strip AS DIRECTED TO CHECK BLOOD SUGAR TWICE DAILY 200 strip 3   insulin glargine (LANTUS SOLOSTAR) 100 UNIT/ML Solostar Pen INJECT 0.3 TO 0.35 MLS(30 TO 35 UNITS) INTO THE SKIN EVERY DAY 15 mL 3   isosorbide mononitrate (IMDUR) 30 MG 24 hr tablet Take 1 tablet (30 mg total) by mouth daily. 180 tablet 3   metFORMIN (GLUCOPHAGE) 500 MG tablet TAKE 2 TABLETS BY MOUTH TWICE DAILY WITH FOOD 360 tablet 3   Multiple Vitamin (MULTIVITAMIN WITH MINERALS) TABS tablet Take 1 tablet by mouth in the morning.     pantoprazole (PROTONIX) 40 MG tablet Take 1 tablet (40 mg total) by mouth daily.     prasugrel (EFFIENT) 10 MG TABS tablet Take 1 tablet (10 mg total) by mouth daily. 30 tablet 5   simvastatin (ZOCOR) 20  MG tablet Take 1  tablet (20 mg total) by mouth at bedtime. 90 tablet 3   umeclidinium bromide (INCRUSE ELLIPTA) 62.5 MCG/ACT AEPB Inhale 1 puff into the lungs daily. 90 each 3   valsartan (DIOVAN) 40 MG tablet Take 20 mg by mouth daily with supper.     mupirocin ointment (BACTROBAN) 2 % Apply topically 2 (two) times daily. Place over ulcerated wound right leg for 10 days. (Patient not taking: Reported on 11/12/2022) 22 g 0   No current facility-administered medications for this encounter.    Allergies  Allergen Reactions   Aldactone [Spironolactone] Other (See Comments)    Hyperkalemia   Brilinta [Ticagrelor] Shortness Of Breath    Numbness in hands and feet Blurry vision   Clopidogrel Rash    Redness and Itchiness   Codeine Rash and Hives   Amoxicillin Hives   Atorvastatin     Myalgias with lipitor.  Does tolerate simvastatin.     Januvia [Sitagliptin] Other (See Comments)    Diarrhea and heart racing   Jardiance [Empagliflozin] Other (See Comments)    Polyuria; excessive weight loss   Lisinopril     Possible cause of pancreatitis   Rosuvastatin Other (See Comments)    myalgia      Social History   Socioeconomic History   Marital status: Married    Spouse name: Patti   Number of children: 1   Years of education: Not on file   Highest education level: Associate degree: academic program  Occupational History   Occupation: retired  Tobacco Use   Smoking status: Former    Packs/day: 2.00    Years: 40.00    Additional pack years: 0.00    Total pack years: 80.00    Types: Cigarettes    Start date: 1964    Quit date: 05/06/2004    Years since quitting: 18.5    Passive exposure: Never   Smokeless tobacco: Never  Vaping Use   Vaping Use: Never used  Substance and Sexual Activity   Alcohol use: Not Currently   Drug use: No   Sexual activity: Yes    Partners: Female  Other Topics Concern   Not on file  Social History Narrative   [place] vet, he has known service  related agent orange exposure.    Retired   Community education officer daily.     Social Determinants of Health   Financial Resource Strain: Low Risk  (10/30/2022)   Overall Financial Resource Strain (CARDIA)    Difficulty of Paying Living Expenses: Not very hard  Food Insecurity: No Food Insecurity (11/04/2022)   Hunger Vital Sign    Worried About Running Out of Food in the Last Year: Never true    Ran Out of Food in the Last Year: Never true  Transportation Needs: No Transportation Needs (11/04/2022)   PRAPARE - Administrator, Civil Service (Medical): No    Lack of Transportation (Non-Medical): No  Physical Activity: Sufficiently Active (10/16/2022)   Exercise Vital Sign    Days of Exercise per Week: 6 days    Minutes of Exercise per Session: 50 min  Stress: No Stress Concern Present (10/16/2022)   [employer] of Occupational Health - Occupational Stress Questionnaire    Feeling of Stress : Not at all  Social Connections: Moderately Integrated (10/16/2022)   Social Connection and Isolation Panel [NHANES]    Frequency of Communication with Friends and Family: Three times a week    Frequency of Social Gatherings with Friends and Family: Once a week  Attends Religious Services: More than 4 times per year    Active Member of Clubs or Organizations: No    Attends Banker Meetings: Never    Marital Status: Married  Catering manager Violence: Not At Risk (10/30/2022)   Humiliation, Afraid, Rape, and Kick questionnaire    Fear of Current or Ex-Partner: No    Emotionally Abused: No    Physically Abused: No    Sexually Abused: No      Family History  Problem Relation Age of Onset   Hypertension Mother    Stroke Mother    Hyperlipidemia Mother    Lung cancer Father    Diabetes Sister    Heart disease Sister        Before age 23   Hypertension Sister    Hyperlipidemia Sister    Heart attack Sister    Hypertension Son    Colon cancer Neg Hx    Prostate cancer Neg Hx     Esophageal cancer Neg Hx    Stomach cancer Neg Hx    Rectal cancer Neg Hx    Colon polyps Neg Hx    Pancreatic cancer Neg Hx     Vitals:   11/12/22 1503  BP: 128/68  Pulse: 78  SpO2: 92%  Weight: 79.7 kg (175 lb 12.8 oz)    PHYSICAL EXAM: General:  Well appearing. No respiratory difficulty HEENT: normal Neck: supple. no JVD. Carotids 2+ bilat; no bruits. No lymphadenopathy or thryomegaly appreciated. Cor: PMI nondisplaced. Regular rate & rhythm. No rubs, gallops or murmurs. Lungs: clear Abdomen: soft, nontender, nondistended. No hepatosplenomegaly. No bruits or masses. Good bowel sounds. Extremities: no cyanosis, clubbing, rash, edema. +LE wounds RT LE w/ dependent rubor Neuro: alert & oriented x 3, cranial nerves grossly intact. moves all 4 extremities w/o difficulty. Affect pleasant.  ECG: not performed    ASSESSMENT & PLAN:  1. Chronic HFpEF - h/o HFrEF. EF previously 30-35% in 2013. LHC then showed CAD but nonobstructive. EF improved post CRT-D. Has known LBBB, suspect likely LBBB mediated CM. Device responder. EF normalized on echo in 2019 - echo 6/24 EF 55-60%. RV normal  - NYHA II, limited more by claudication  - euvolemic on exam and by CorVue Impedence, 99% paced. No VT/VF  - Continue Lasix 40 mg daily. Will be followed by the [facility] heart failure device clinic. Planning repeat transmission in a week to reassess volume status  - Avoiding SGLT2i given severe xerosis cutis and chronic wounds, especially bad on the ischemic leg.  - Continue Coreg 25 mg daily  - no spiro given recent BMP w/ K upper limits of normal at 5.0  - discussed fluid restriction   2. CAD - s/p PCI to prox-mid LAD 5/24 - s/p CTO PCI to RCA with stents placed in the distal, mid and proximal RCA.  The distal and mid stents overlap.  - denies CP  - on DATP w/ ASA + Effient - on statin   3. LE PVD  - followed by Dr. Randie Heinz  - awaiting rt femoral enterectomy - on DAPT + statin   4. LBBB -  S/p CRT-D   - LVEF improved post implant  - followed by Dr. Graciela Husbands    Referred to HFSW (PCP, Medications, Transportation, ETOH Abuse, Drug Abuse, Insurance, Financial ):  No Refer to Pharmacy: No Refer to Home Health: No Refer to Advanced Heart Failure Clinic: No  Refer to General Cardiology: Yes (already established)  Continue Follow up w/ cardiology  and EP. Keep appt scheduled in 4 wks.   Robbie Lis, PA-C 11/12/2022

## 2022-11-13 ENCOUNTER — Encounter: Payer: Self-pay | Admitting: *Deleted

## 2022-11-13 ENCOUNTER — Other Ambulatory Visit: Payer: Self-pay | Admitting: *Deleted

## 2022-11-13 ENCOUNTER — Ambulatory Visit (INDEPENDENT_AMBULATORY_CARE_PROVIDER_SITE_OTHER): Payer: Medicare Other | Admitting: Vascular Surgery

## 2022-11-13 ENCOUNTER — Encounter: Payer: Self-pay | Admitting: Vascular Surgery

## 2022-11-13 VITALS — BP 149/70 | HR 75 | Temp 97.8°F | Resp 20 | Ht 74.0 in | Wt 173.0 lb

## 2022-11-13 DIAGNOSIS — Z7901 Long term (current) use of anticoagulants: Secondary | ICD-10-CM

## 2022-11-13 DIAGNOSIS — I70261 Atherosclerosis of native arteries of extremities with gangrene, right leg: Secondary | ICD-10-CM | POA: Diagnosis not present

## 2022-11-13 MED ORDER — DOXYCYCLINE HYCLATE 100 MG PO CAPS: 100.0000 mg | ORAL_CAPSULE | Freq: Two times a day (BID) | ORAL | 0 refills | Status: DC

## 2022-11-13 NOTE — Progress Notes (Signed)
Patient ID: Luke [name], male   DOB: June 30, 1945, 77 y.o.   MRN: [id number]  Reason for Consult: Follow-up   Referred by [name], MD  Subjective:     HPI:  ADMIRAL MARCUCCI is a 77 y.o. male with history of endovascular aneurysm repair recently underwent left heart cath and stent placement and had subsequent adverse reaction to either Plavix or antibiotics.  He was planned for right common femoral endarterectomy and right common femoral to below-knee popliteal artery bypass but ultimately this was canceled due to rash.  He follows up and states that his rash is mostly improved except for his right lower extremity where he has continued weeping and swelling as well as cellulitis.  He was on doxycycline and this did help the issue but he is now on our.  He denies any fevers or chills.  Past Medical History:  Diagnosis Date   AAA (abdominal aortic aneurysm) (HCC) 2011   Per vascular surgery   Atrial fibrillation (HCC)    CAD (coronary artery disease)    Presumed CAD with nuclear scan October 09, 2011,  large anteroseptal MI and inferior MI. Catheterization scheduled October 15, 2011   Cardiomyopathy [facility])    Nuclear, October 09, 2011, EF 30%, multiple focal wall motion abnormalities   Chronic kidney disease    CKD3   Diabetes mellitus    type II   GERD (gastroesophageal reflux disease)    Silent   HLD (hyperlipidemia)    Hypertension    white coat HTN-- often elevated in office and controlled on outside checks.   ICD (implantable cardioverter-defibrillator) in place    CRT-D placed March, 2014 complete heart block and k dysfunction   IPF (idiopathic pulmonary fibrosis) (HCC)    LBBB (left bundle branch block)    LBBB on EKG October 11, 2011,  no prior EKG has been done   Low testosterone    Hx of   Pacemaker    PAD (peripheral artery disease) (HCC)    Pancreatitis    Umbilical hernia    Family History  Problem Relation Age of Onset   Hypertension Mother    Stroke Mother     Hyperlipidemia Mother    Lung cancer Father    Diabetes Sister    Heart disease Sister        Before age 5   Hypertension Sister    Hyperlipidemia Sister    Heart attack Sister    Hypertension Son    Colon cancer Neg Hx    Prostate cancer Neg Hx    Esophageal cancer Neg Hx    Stomach cancer Neg Hx    Rectal cancer Neg Hx    Colon polyps Neg Hx    Pancreatic cancer Neg Hx    Past Surgical History:  Procedure Laterality Date   ABDOMINAL AORTAGRAM N/A 10/28/2011   Procedure: ABDOMINAL AORTAGRAM;  Surgeon: [name], MD;  Location: [facility] CATH LAB;  Service: Cardiovascular;  Laterality: N/A;   ABDOMINAL AORTIC ANEURYSM REPAIR     EVAR    ABDOMINAL AORTOGRAM N/A 05/27/2022   Procedure: ABDOMINAL AORTOGRAM;  Surgeon: Maeola Harman, MD;  Location: [facility] INVASIVE CV LAB;  Service: Cardiovascular;  Laterality: N/A;   BI-VENTRICULAR IMPLANTABLE CARDIOVERTER DEFIBRILLATOR N/A 07/16/2012   Procedure: BI-VENTRICULAR IMPLANTABLE CARDIOVERTER DEFIBRILLATOR  (CRT-D);  Surgeon: Duke Salvia, MD;  Location: [facility] CATH LAB;  Service: Cardiovascular;  Laterality: N/A;   BIV ICD GENERATOR CHANGEOUT N/A 10/25/2020   Procedure: BIV  ICD GENERATOR CHANGEOUT;  Surgeon: Duke Salvia, MD;  Location: [facility] INVASIVE CV LAB;  Service: Cardiovascular;  Laterality: N/A;   CARDIAC CATHETERIZATION     CATARACT EXTRACTION Left 2023   COLONOSCOPY  03/23/2018; 2020   CORONARY CTO INTERVENTION N/A 10/10/2022   Procedure: CORONARY CTO INTERVENTION;  Surgeon: Corky Crafts, MD;  Location: [facility] INVASIVE CV LAB;  Service: Cardiovascular;  Laterality: N/A;   CORONARY LITHOTRIPSY N/A 09/18/2022   Procedure: CORONARY LITHOTRIPSY;  Surgeon: Corky Crafts, MD;  Location: [facility] INVASIVE CV LAB;  Service: Cardiovascular;  Laterality: N/A;   CORONARY STENT INTERVENTION N/A 09/18/2022   Procedure: CORONARY STENT INTERVENTION;  Surgeon: Corky Crafts, MD;  Location: [facility] INVASIVE CV LAB;  Service:  Cardiovascular;  Laterality: N/A;   CORONARY ULTRASOUND/IVUS N/A 09/18/2022   Procedure: Coronary Ultrasound/IVUS;  Surgeon: Corky Crafts, MD;  Location: [facility] INVASIVE CV LAB;  Service: Cardiovascular;  Laterality: N/A;   ESOPHAGOGASTRODUODENOSCOPY N/A 02/12/2019   Procedure: ESOPHAGOGASTRODUODENOSCOPY (EGD);  Surgeon: Lynann Bologna, MD;  Location: [facility] ENDOSCOPY;  Service: Endoscopy;  Laterality: N/A;   ESOPHAGOGASTRODUODENOSCOPY (EGD) WITH PROPOFOL N/A 02/05/2018   Procedure: ESOPHAGOGASTRODUODENOSCOPY (EGD) WITH PROPOFOL;  Surgeon: Napoleon Form, MD;  Location: [facility] ENDOSCOPY;  Service: Endoscopy;  Laterality: N/A;   ESOPHAGOGASTRODUODENOSCOPY (EGD) WITH PROPOFOL N/A 09/27/2018   Procedure: ESOPHAGOGASTRODUODENOSCOPY (EGD) WITH PROPOFOL;  Surgeon: Jeani Hawking, MD;  Location: [facility] ENDOSCOPY;  Service: Endoscopy;  Laterality: N/A;   FOREIGN BODY REMOVAL  09/27/2018   Procedure: FOREIGN BODY REMOVAL;  Surgeon: Jeani Hawking, MD;  Location: [facility] ENDOSCOPY;  Service: Endoscopy;;   FOREIGN BODY REMOVAL  02/12/2019   Procedure: FOREIGN BODY REMOVAL;  Surgeon: Lynann Bologna, MD;  Location: [facility] ENDOSCOPY;  Service: Endoscopy;;   HERNIA REPAIR  05/14/2013   INSERTION OF MESH N/A 05/14/2013   Procedure: INSERTION OF MESH;  Surgeon: Ardeth Sportsman, MD;  Location: [facility] OR;  Service: General;  Laterality: N/A;   LEFT HEART CATH N/A 10/10/2022   Procedure: Left Heart Cath;  Surgeon: Corky Crafts, MD;  Location: [facility] INVASIVE CV LAB;  Service: Cardiovascular;  Laterality: N/A;   LEFT HEART CATH AND CORONARY ANGIOGRAPHY N/A 07/17/2021   Procedure: LEFT HEART CATH AND CORONARY ANGIOGRAPHY;  Surgeon: Lennette Bihari, MD;  Location: [facility] INVASIVE CV LAB;  Service: Cardiovascular;  Laterality: N/A;   LEFT HEART CATH AND CORONARY ANGIOGRAPHY N/A 09/18/2022   Procedure: LEFT HEART CATH AND CORONARY ANGIOGRAPHY;  Surgeon: Corky Crafts, MD;  Location: [facility] INVASIVE CV LAB;  Service: Cardiovascular;   Laterality: N/A;   LOWER EXTREMITY ANGIOGRAM Bilateral 10/28/2011   Procedure: LOWER EXTREMITY ANGIOGRAM;  Surgeon: [name], MD;  Location: [facility] CATH LAB;  Service: Cardiovascular;  Laterality: Bilateral;   LOWER EXTREMITY ANGIOGRAPHY  05/27/2022   Procedure: Lower Extremity Angiography;  Surgeon: Maeola Harman, MD;  Location: [facility] INVASIVE CV LAB;  Service: Cardiovascular;;   PACEMAKER INSERTION  07/16/2012   pacemaker/defibrilator   POLYPECTOMY     POSTERIOR CERVICAL FUSION/FORAMINOTOMY N/A 06/09/2014   Procedure: Laminectomy - Cervical two-Cervcial four posterior cervical instrumented fusion Cervical two-cervical four;  Surgeon: Tia Alert, MD;  Location: [facility] NEURO ORS;  Service: Neurosurgery;  Laterality: N/A;  posterior    SAVORY DILATION N/A 02/05/2018   Procedure: SAVORY DILATION;  Surgeon: Napoleon Form, MD;  Location: [facility] ENDOSCOPY;  Service: Endoscopy;  Laterality: N/A;   TONSILLECTOMY     as a child    UMBILICAL HERNIA REPAIR N/A 05/14/2013   Procedure: LAPAROSCOPIC  exploration and repair of hernia in abdominal ;  Surgeon: Ardeth Sportsman, MD;  Location: [facility] OR;  Service: General;  Laterality: N/A;   UPPER GASTROINTESTINAL ENDOSCOPY  2020    Short Social History:  Social History   Tobacco Use   Smoking status: Former    Packs/day: 2.00    Years: 40.00    Additional pack years: 0.00    Total pack years: 80.00    Types: Cigarettes    Start date: 1964    Quit date: 05/06/2004    Years since quitting: 18.5    Passive exposure: Never   Smokeless tobacco: Never  Substance Use Topics   Alcohol use: Not Currently    Allergies  Allergen Reactions   Aldactone [Spironolactone] Other (See Comments)    Hyperkalemia   Brilinta [Ticagrelor] Shortness Of Breath    Numbness in hands and feet Blurry vision   Clopidogrel Rash    Redness and Itchiness   Codeine Rash and Hives   Amoxicillin Hives   Atorvastatin     Myalgias with lipitor.  Does  tolerate simvastatin.     Januvia [Sitagliptin] Other (See Comments)    Diarrhea and heart racing   Jardiance [Empagliflozin] Other (See Comments)    Polyuria; excessive weight loss   Lisinopril     Possible cause of pancreatitis   Rosuvastatin Other (See Comments)    myalgia    Current Outpatient Medications  Medication Sig Dispense Refill   albuterol (VENTOLIN HFA) 108 (90 Base) MCG/ACT inhaler Inhale 2 puffs into the lungs every 6 (six) hours as needed for wheezing or shortness of breath. 8 g 6   amLODipine (NORVASC) 10 MG tablet TAKE 1 TABLET(10 MG) BY MOUTH DAILY 90 tablet 3   aspirin EC 81 MG tablet Take 1 tablet (81 mg total) by mouth daily. 120 tablet 2   carvedilol (COREG) 25 MG tablet TAKE 1 TABLET BY MOUTH TWICE DAILY WITH MEALS 180 tablet 3   cetirizine (ZYRTEC) 10 MG tablet Take 1 tablet (10 mg total) by mouth daily. 30 tablet 0   Cholecalciferol (VITAMIN D3) 50 MCG (2000 UT) TABS Take 2,000 Units by mouth in the morning.     Cyanocobalamin (VITAMIN B-12 PO) Take 2,000 mcg by mouth in the morning.     furosemide (LASIX) 40 MG tablet Take 1 tablet (40 mg total) by mouth daily as needed for edema or fluid (weight 178 lbs or higher.). 30 tablet 0   glipiZIDE (GLUCOTROL) 5 MG tablet Take 1 tablet (5 mg total) by mouth 2 (two) times daily before a meal.     glucose blood (ACCU-CHEK AVIVA PLUS) test strip AS DIRECTED TO CHECK BLOOD SUGAR TWICE DAILY 200 strip 3   insulin glargine (LANTUS SOLOSTAR) 100 UNIT/ML Solostar Pen INJECT 0.3 TO 0.35 MLS(30 TO 35 UNITS) INTO THE SKIN EVERY DAY 15 mL 3   isosorbide mononitrate (IMDUR) 30 MG 24 hr tablet Take 1 tablet (30 mg total) by mouth daily. 180 tablet 3   metFORMIN (GLUCOPHAGE) 500 MG tablet TAKE 2 TABLETS BY MOUTH TWICE DAILY WITH FOOD 360 tablet 3   Multiple Vitamin (MULTIVITAMIN WITH MINERALS) TABS tablet Take 1 tablet by mouth in the morning.     mupirocin ointment (BACTROBAN) 2 % Apply topically 2 (two) times daily. Place over  ulcerated wound right leg for 10 days. 22 g 0   pantoprazole (PROTONIX) 40 MG tablet Take 1 tablet (40 mg total) by mouth daily.     prasugrel (EFFIENT) 10  MG TABS tablet Take 1 tablet (10 mg total) by mouth daily. 30 tablet 5   simvastatin (ZOCOR) 20 MG tablet Take 1 tablet (20 mg total) by mouth at bedtime. 90 tablet 3   umeclidinium bromide (INCRUSE ELLIPTA) 62.5 MCG/ACT AEPB Inhale 1 puff into the lungs daily. 90 each 3   valsartan (DIOVAN) 40 MG tablet Take 20 mg by mouth daily with supper.     No current facility-administered medications for this visit.    Review of Systems  Constitutional:  Constitutional negative. HENT: HENT negative.  Eyes: Eyes negative.  Respiratory: Respiratory negative.  Cardiovascular: Positive for leg swelling.  GI: Gastrointestinal negative.  Skin: Positive for rash and wound.  Neurological: Positive for numbness.  Hematologic: Hematologic/lymphatic negative.  Psychiatric: Psychiatric negative.        Objective:  Objective   Vitals:   11/13/22 0931  BP: (!) 149/70  Pulse: 75  Resp: 20  Temp: 97.8 F (36.6 C)  SpO2: 94%  Weight: 173 lb (78.5 kg)  Height: 6\' 2"  (1.88 m)   Body mass index is 22.21 kg/m.  Physical Exam HENT:     Head: Normocephalic.     Nose: Nose normal.     Mouth/Throat:     Mouth: Mucous membranes are moist.  Eyes:     Pupils: Pupils are equal, round, and reactive to light.  Cardiovascular:     Pulses:          Femoral pulses are 1+ on the right side. Pulmonary:     Effort: Pulmonary effort is normal.  Skin:    Capillary Refill: Capillary refill takes more than 3 seconds.     Comments: Pictured below  Neurological:     General: No focal deficit present.     Mental Status: He is alert.  Psychiatric:        Mood and Affect: Mood normal.     Data: ABI Findings:  +---------+------------------+-----+-----------+--------+  Right   Rt Pressure (mmHg)IndexWaveform   Comment    +---------+------------------+-----+-----------+--------+  Brachial 136                                         +---------+------------------+-----+-----------+--------+  PTA     96                0.63 multiphasic          +---------+------------------+-----+-----------+--------+  DP      90                0.59 multiphasic          +---------+------------------+-----+-----------+--------+  Great Toe42                0.28 Abnormal             +---------+------------------+-----+-----------+--------+   +---------+------------------+-----+--------+-------+  Left    Lt Pressure (mmHg)IndexWaveformComment  +---------+------------------+-----+--------+-------+  Brachial 152                                     +---------+------------------+-----+--------+-------+  PTA     138               0.91 biphasic         +---------+------------------+-----+--------+-------+  DP      133               0.88  biphasic         +---------+------------------+-----+--------+-------+  Great Toe91                0.60 Abnormal         +---------+------------------+-----+--------+-------+   +-------+-----------+-----------+------------+------------+  ABI/TBIToday's ABIToday's TBIPrevious ABIPrevious TBI  +-------+-----------+-----------+------------+------------+  Right 0.63       0.28       0.62        0.37          +-------+-----------+-----------+------------+------------+  Left  0.91       0.60       0.94        0.61          +-------+-----------+-----------+------------+------------+           Assessment/Plan:    77 year old male originally planned for bypass for small wound on his foot now with nonhealing rash after antibiotics and Plavix from coronary artery stenting.  He is now on Brilinta and aspirin we will need to continue this.  I reviewed reviewed his angiogram and discussed the patient and his wife.  Plan will be for right  common femoral endarterectomy with right lower extremity angiography possibly above-the-knee bypass with or without stenting and possibly stenting of the entire right lower extremity.  If no stent or bypass is possible above the knee we will plan for just common femoral endarterectomy to see if we can get the leg to heal.  I would not want to make any below-knee decision as I have serious concerns of infection and/or nonhealing which would lead to possible above-the-knee amputation.  I discussed this with the patient's wife they demonstrate good understanding we will get him scheduled ASAP.  I have sent doxycycline to his pharmacy.     Maeola Harman MD [facility]

## 2022-11-13 NOTE — H&P (View-Only) (Signed)
 Patient ID: Luke [name], male   DOB: June 30, 1945, 77 y.o.   MRN: [id number]  Reason for Consult: Follow-up   Referred by [name], MD  Subjective:     HPI:  Luke [name] is a 77 y.o. male with history of endovascular aneurysm repair recently underwent left heart cath and stent placement and had subsequent adverse reaction to either Plavix or antibiotics.  He was planned for right common femoral endarterectomy and right common femoral to below-knee popliteal artery bypass but ultimately this was canceled due to rash.  He follows up and states that his rash is mostly improved except for his right lower extremity where he has continued weeping and swelling as well as cellulitis.  He was on doxycycline and this did help the issue but he is now on our.  He denies any fevers or chills.  Past Medical History:  Diagnosis Date   AAA (abdominal aortic aneurysm) (HCC) 2011   Per vascular surgery   Atrial fibrillation (HCC)    CAD (coronary artery disease)    Presumed CAD with nuclear scan October 09, 2011,  large anteroseptal MI and inferior MI. Catheterization scheduled October 15, 2011   Cardiomyopathy [facility])    Nuclear, October 09, 2011, EF 30%, multiple focal wall motion abnormalities   Chronic kidney disease    CKD3   Diabetes mellitus    type II   GERD (gastroesophageal reflux disease)    Silent   HLD (hyperlipidemia)    Hypertension    white coat HTN-- often elevated in office and controlled on outside checks.   ICD (implantable cardioverter-defibrillator) in place    CRT-D placed March, 2014 complete heart block and k dysfunction   IPF (idiopathic pulmonary fibrosis) (HCC)    LBBB (left bundle branch block)    LBBB on EKG October 11, 2011,  no prior EKG has been done   Low testosterone    Hx of   Pacemaker    PAD (peripheral artery disease) (HCC)    Pancreatitis    Umbilical hernia    Family History  Problem Relation Age of Onset   Hypertension Mother    Stroke Mother     Hyperlipidemia Mother    Lung cancer Father    Diabetes Sister    Heart disease Sister        Before age 5   Hypertension Sister    Hyperlipidemia Sister    Heart attack Sister    Hypertension Son    Colon cancer Neg Hx    Prostate cancer Neg Hx    Esophageal cancer Neg Hx    Stomach cancer Neg Hx    Rectal cancer Neg Hx    Colon polyps Neg Hx    Pancreatic cancer Neg Hx    Past Surgical History:  Procedure Laterality Date   ABDOMINAL AORTAGRAM N/A 10/28/2011   Procedure: ABDOMINAL AORTAGRAM;  Surgeon: [name], MD;  Location: [facility] CATH LAB;  Service: Cardiovascular;  Laterality: N/A;   ABDOMINAL AORTIC ANEURYSM REPAIR     EVAR    ABDOMINAL AORTOGRAM N/A 05/27/2022   Procedure: ABDOMINAL AORTOGRAM;  Surgeon: Maeola Harman, MD;  Location: [facility] INVASIVE CV LAB;  Service: Cardiovascular;  Laterality: N/A;   BI-VENTRICULAR IMPLANTABLE CARDIOVERTER DEFIBRILLATOR N/A 07/16/2012   Procedure: BI-VENTRICULAR IMPLANTABLE CARDIOVERTER DEFIBRILLATOR  (CRT-D);  Surgeon: Duke Salvia, MD;  Location: [facility] CATH LAB;  Service: Cardiovascular;  Laterality: N/A;   BIV ICD GENERATOR CHANGEOUT N/A 10/25/2020   Procedure: BIV  ICD GENERATOR CHANGEOUT;  Surgeon: Duke Salvia, MD;  Location: [facility] INVASIVE CV LAB;  Service: Cardiovascular;  Laterality: N/A;   CARDIAC CATHETERIZATION     CATARACT EXTRACTION Left 2023   COLONOSCOPY  03/23/2018; 2020   CORONARY CTO INTERVENTION N/A 10/10/2022   Procedure: CORONARY CTO INTERVENTION;  Surgeon: Corky Crafts, MD;  Location: [facility] INVASIVE CV LAB;  Service: Cardiovascular;  Laterality: N/A;   CORONARY LITHOTRIPSY N/A 09/18/2022   Procedure: CORONARY LITHOTRIPSY;  Surgeon: Corky Crafts, MD;  Location: [facility] INVASIVE CV LAB;  Service: Cardiovascular;  Laterality: N/A;   CORONARY STENT INTERVENTION N/A 09/18/2022   Procedure: CORONARY STENT INTERVENTION;  Surgeon: Corky Crafts, MD;  Location: [facility] INVASIVE CV LAB;  Service:  Cardiovascular;  Laterality: N/A;   CORONARY ULTRASOUND/IVUS N/A 09/18/2022   Procedure: Coronary Ultrasound/IVUS;  Surgeon: Corky Crafts, MD;  Location: [facility] INVASIVE CV LAB;  Service: Cardiovascular;  Laterality: N/A;   ESOPHAGOGASTRODUODENOSCOPY N/A 02/12/2019   Procedure: ESOPHAGOGASTRODUODENOSCOPY (EGD);  Surgeon: Lynann Bologna, MD;  Location: [facility] ENDOSCOPY;  Service: Endoscopy;  Laterality: N/A;   ESOPHAGOGASTRODUODENOSCOPY (EGD) WITH PROPOFOL N/A 02/05/2018   Procedure: ESOPHAGOGASTRODUODENOSCOPY (EGD) WITH PROPOFOL;  Surgeon: Napoleon Form, MD;  Location: [facility] ENDOSCOPY;  Service: Endoscopy;  Laterality: N/A;   ESOPHAGOGASTRODUODENOSCOPY (EGD) WITH PROPOFOL N/A 09/27/2018   Procedure: ESOPHAGOGASTRODUODENOSCOPY (EGD) WITH PROPOFOL;  Surgeon: Jeani Hawking, MD;  Location: [facility] ENDOSCOPY;  Service: Endoscopy;  Laterality: N/A;   FOREIGN BODY REMOVAL  09/27/2018   Procedure: FOREIGN BODY REMOVAL;  Surgeon: Jeani Hawking, MD;  Location: [facility] ENDOSCOPY;  Service: Endoscopy;;   FOREIGN BODY REMOVAL  02/12/2019   Procedure: FOREIGN BODY REMOVAL;  Surgeon: Lynann Bologna, MD;  Location: [facility] ENDOSCOPY;  Service: Endoscopy;;   HERNIA REPAIR  05/14/2013   INSERTION OF MESH N/A 05/14/2013   Procedure: INSERTION OF MESH;  Surgeon: Ardeth Sportsman, MD;  Location: [facility] OR;  Service: General;  Laterality: N/A;   LEFT HEART CATH N/A 10/10/2022   Procedure: Left Heart Cath;  Surgeon: Corky Crafts, MD;  Location: [facility] INVASIVE CV LAB;  Service: Cardiovascular;  Laterality: N/A;   LEFT HEART CATH AND CORONARY ANGIOGRAPHY N/A 07/17/2021   Procedure: LEFT HEART CATH AND CORONARY ANGIOGRAPHY;  Surgeon: Lennette Bihari, MD;  Location: [facility] INVASIVE CV LAB;  Service: Cardiovascular;  Laterality: N/A;   LEFT HEART CATH AND CORONARY ANGIOGRAPHY N/A 09/18/2022   Procedure: LEFT HEART CATH AND CORONARY ANGIOGRAPHY;  Surgeon: Corky Crafts, MD;  Location: [facility] INVASIVE CV LAB;  Service: Cardiovascular;   Laterality: N/A;   LOWER EXTREMITY ANGIOGRAM Bilateral 10/28/2011   Procedure: LOWER EXTREMITY ANGIOGRAM;  Surgeon: [name], MD;  Location: [facility] CATH LAB;  Service: Cardiovascular;  Laterality: Bilateral;   LOWER EXTREMITY ANGIOGRAPHY  05/27/2022   Procedure: Lower Extremity Angiography;  Surgeon: Maeola Harman, MD;  Location: [facility] INVASIVE CV LAB;  Service: Cardiovascular;;   PACEMAKER INSERTION  07/16/2012   pacemaker/defibrilator   POLYPECTOMY     POSTERIOR CERVICAL FUSION/FORAMINOTOMY N/A 06/09/2014   Procedure: Laminectomy - Cervical two-Cervcial four posterior cervical instrumented fusion Cervical two-cervical four;  Surgeon: Tia Alert, MD;  Location: [facility] NEURO ORS;  Service: Neurosurgery;  Laterality: N/A;  posterior    SAVORY DILATION N/A 02/05/2018   Procedure: SAVORY DILATION;  Surgeon: Napoleon Form, MD;  Location: [facility] ENDOSCOPY;  Service: Endoscopy;  Laterality: N/A;   TONSILLECTOMY     as a child    UMBILICAL HERNIA REPAIR N/A 05/14/2013   Procedure: LAPAROSCOPIC  exploration and repair of hernia in abdominal ;  Surgeon: Ardeth Sportsman, MD;  Location: [facility] OR;  Service: General;  Laterality: N/A;   UPPER GASTROINTESTINAL ENDOSCOPY  2020    Short Social History:  Social History   Tobacco Use   Smoking status: Former    Packs/day: 2.00    Years: 40.00    Additional pack years: 0.00    Total pack years: 80.00    Types: Cigarettes    Start date: 1964    Quit date: 05/06/2004    Years since quitting: 18.5    Passive exposure: Never   Smokeless tobacco: Never  Substance Use Topics   Alcohol use: Not Currently    Allergies  Allergen Reactions   Aldactone [Spironolactone] Other (See Comments)    Hyperkalemia   Brilinta [Ticagrelor] Shortness Of Breath    Numbness in hands and feet Blurry vision   Clopidogrel Rash    Redness and Itchiness   Codeine Rash and Hives   Amoxicillin Hives   Atorvastatin     Myalgias with lipitor.  Does  tolerate simvastatin.     Januvia [Sitagliptin] Other (See Comments)    Diarrhea and heart racing   Jardiance [Empagliflozin] Other (See Comments)    Polyuria; excessive weight loss   Lisinopril     Possible cause of pancreatitis   Rosuvastatin Other (See Comments)    myalgia    Current Outpatient Medications  Medication Sig Dispense Refill   albuterol (VENTOLIN HFA) 108 (90 Base) MCG/ACT inhaler Inhale 2 puffs into the lungs every 6 (six) hours as needed for wheezing or shortness of breath. 8 g 6   amLODipine (NORVASC) 10 MG tablet TAKE 1 TABLET(10 MG) BY MOUTH DAILY 90 tablet 3   aspirin EC 81 MG tablet Take 1 tablet (81 mg total) by mouth daily. 120 tablet 2   carvedilol (COREG) 25 MG tablet TAKE 1 TABLET BY MOUTH TWICE DAILY WITH MEALS 180 tablet 3   cetirizine (ZYRTEC) 10 MG tablet Take 1 tablet (10 mg total) by mouth daily. 30 tablet 0   Cholecalciferol (VITAMIN D3) 50 MCG (2000 UT) TABS Take 2,000 Units by mouth in the morning.     Cyanocobalamin (VITAMIN B-12 PO) Take 2,000 mcg by mouth in the morning.     furosemide (LASIX) 40 MG tablet Take 1 tablet (40 mg total) by mouth daily as needed for edema or fluid (weight 178 lbs or higher.). 30 tablet 0   glipiZIDE (GLUCOTROL) 5 MG tablet Take 1 tablet (5 mg total) by mouth 2 (two) times daily before a meal.     glucose blood (ACCU-CHEK AVIVA PLUS) test strip AS DIRECTED TO CHECK BLOOD SUGAR TWICE DAILY 200 strip 3   insulin glargine (LANTUS SOLOSTAR) 100 UNIT/ML Solostar Pen INJECT 0.3 TO 0.35 MLS(30 TO 35 UNITS) INTO THE SKIN EVERY DAY 15 mL 3   isosorbide mononitrate (IMDUR) 30 MG 24 hr tablet Take 1 tablet (30 mg total) by mouth daily. 180 tablet 3   metFORMIN (GLUCOPHAGE) 500 MG tablet TAKE 2 TABLETS BY MOUTH TWICE DAILY WITH FOOD 360 tablet 3   Multiple Vitamin (MULTIVITAMIN WITH MINERALS) TABS tablet Take 1 tablet by mouth in the morning.     mupirocin ointment (BACTROBAN) 2 % Apply topically 2 (two) times daily. Place over  ulcerated wound right leg for 10 days. 22 g 0   pantoprazole (PROTONIX) 40 MG tablet Take 1 tablet (40 mg total) by mouth daily.     prasugrel (EFFIENT) 10  MG TABS tablet Take 1 tablet (10 mg total) by mouth daily. 30 tablet 5   simvastatin (ZOCOR) 20 MG tablet Take 1 tablet (20 mg total) by mouth at bedtime. 90 tablet 3   umeclidinium bromide (INCRUSE ELLIPTA) 62.5 MCG/ACT AEPB Inhale 1 puff into the lungs daily. 90 each 3   valsartan (DIOVAN) 40 MG tablet Take 20 mg by mouth daily with supper.     No current facility-administered medications for this visit.    Review of Systems  Constitutional:  Constitutional negative. HENT: HENT negative.  Eyes: Eyes negative.  Respiratory: Respiratory negative.  Cardiovascular: Positive for leg swelling.  GI: Gastrointestinal negative.  Skin: Positive for rash and wound.  Neurological: Positive for numbness.  Hematologic: Hematologic/lymphatic negative.  Psychiatric: Psychiatric negative.        Objective:  Objective   Vitals:   11/13/22 0931  BP: (!) 149/70  Pulse: 75  Resp: 20  Temp: 97.8 F (36.6 C)  SpO2: 94%  Weight: 173 lb (78.5 kg)  Height: 6\' 2"  (1.88 m)   Body mass index is 22.21 kg/m.  Physical Exam HENT:     Head: Normocephalic.     Nose: Nose normal.     Mouth/Throat:     Mouth: Mucous membranes are moist.  Eyes:     Pupils: Pupils are equal, round, and reactive to light.  Cardiovascular:     Pulses:          Femoral pulses are 1+ on the right side. Pulmonary:     Effort: Pulmonary effort is normal.  Skin:    Capillary Refill: Capillary refill takes more than 3 seconds.     Comments: Pictured below  Neurological:     General: No focal deficit present.     Mental Status: He is alert.  Psychiatric:        Mood and Affect: Mood normal.     Data: ABI Findings:  +---------+------------------+-----+-----------+--------+  Right   Rt Pressure (mmHg)IndexWaveform   Comment    +---------+------------------+-----+-----------+--------+  Brachial 136                                         +---------+------------------+-----+-----------+--------+  PTA     96                0.63 multiphasic          +---------+------------------+-----+-----------+--------+  DP      90                0.59 multiphasic          +---------+------------------+-----+-----------+--------+  Great Toe42                0.28 Abnormal             +---------+------------------+-----+-----------+--------+   +---------+------------------+-----+--------+-------+  Left    Lt Pressure (mmHg)IndexWaveformComment  +---------+------------------+-----+--------+-------+  Brachial 152                                     +---------+------------------+-----+--------+-------+  PTA     138               0.91 biphasic         +---------+------------------+-----+--------+-------+  DP      133               0.88  biphasic         +---------+------------------+-----+--------+-------+  Great Toe91                0.60 Abnormal         +---------+------------------+-----+--------+-------+   +-------+-----------+-----------+------------+------------+  ABI/TBIToday's ABIToday's TBIPrevious ABIPrevious TBI  +-------+-----------+-----------+------------+------------+  Right 0.63       0.28       0.62        0.37          +-------+-----------+-----------+------------+------------+  Left  0.91       0.60       0.94        0.61          +-------+-----------+-----------+------------+------------+           Assessment/Plan:    77 year old male originally planned for bypass for small wound on his foot now with nonhealing rash after antibiotics and Plavix from coronary artery stenting.  He is now on Brilinta and aspirin we will need to continue this.  I reviewed reviewed his angiogram and discussed the patient and his wife.  Plan will be for right  common femoral endarterectomy with right lower extremity angiography possibly above-the-knee bypass with or without stenting and possibly stenting of the entire right lower extremity.  If no stent or bypass is possible above the knee we will plan for just common femoral endarterectomy to see if we can get the leg to heal.  I would not want to make any below-knee decision as I have serious concerns of infection and/or nonhealing which would lead to possible above-the-knee amputation.  I discussed this with the patient's wife they demonstrate good understanding we will get him scheduled ASAP.  I have sent doxycycline to his pharmacy.     Maeola Harman MD [facility]

## 2022-11-15 ENCOUNTER — Encounter: Payer: Medicare Other | Admitting: Pharmacist

## 2022-11-15 ENCOUNTER — Telehealth: Payer: Self-pay | Admitting: Family Medicine

## 2022-11-15 DIAGNOSIS — I130 Hypertensive heart and chronic kidney disease with heart failure and stage 1 through stage 4 chronic kidney disease, or unspecified chronic kidney disease: Secondary | ICD-10-CM | POA: Diagnosis not present

## 2022-11-15 DIAGNOSIS — Z794 Long term (current) use of insulin: Secondary | ICD-10-CM | POA: Diagnosis not present

## 2022-11-15 DIAGNOSIS — I5023 Acute on chronic systolic (congestive) heart failure: Secondary | ICD-10-CM | POA: Diagnosis not present

## 2022-11-15 DIAGNOSIS — N189 Chronic kidney disease, unspecified: Secondary | ICD-10-CM | POA: Diagnosis not present

## 2022-11-15 DIAGNOSIS — E1122 Type 2 diabetes mellitus with diabetic chronic kidney disease: Secondary | ICD-10-CM | POA: Diagnosis not present

## 2022-11-15 DIAGNOSIS — Z7984 Long term (current) use of oral hypoglycemic drugs: Secondary | ICD-10-CM | POA: Diagnosis not present

## 2022-11-15 NOTE — Telephone Encounter (Signed)
Documenting that patient's spouse picked up medication Lantus from the office on 11/15/2022.

## 2022-11-18 ENCOUNTER — Encounter (HOSPITAL_COMMUNITY): Payer: Self-pay | Admitting: Vascular Surgery

## 2022-11-18 ENCOUNTER — Encounter: Payer: Self-pay | Admitting: Internal Medicine

## 2022-11-18 ENCOUNTER — Encounter: Payer: Self-pay | Admitting: Cardiology

## 2022-11-18 ENCOUNTER — Other Ambulatory Visit: Payer: Self-pay

## 2022-11-18 NOTE — Progress Notes (Signed)
PERIOPERATIVE PRESCRIPTION FOR IMPLANTED CARDIAC DEVICE PROGRAMMING  Patient Information: Name:  Luke [name]  DOB:  08-Jan-1946  MRN:  [id number]    Planned Procedure:  Right common femoral endarterectomy  Surgeon:  Lemar Livings  Date of Procedure:  July 16,2024  Cautery will be used.  Position during surgery:  supine   Please send documentation back to:  [facility] (Fax # [phone])  Device Information:  Clinic EP Physician:  Sherryl Manges, MD   Device Type:  Defibrillator Manufacturer and Phone #:  [employer]: [phone] Pacemaker Dependent?:  Yes.   Date of Last Device Check:  11/12/22 Normal Device Function?:  Yes.    Electrophysiologist's Recommendations:  Have magnet available. Provide continuous ECG monitoring when magnet is used or reprogramming is to be performed.  Procedure should not interfere with device function.  No device programming or magnet placement needed.  Per Device Clinic Standing Orders, Lenor Coffin, RN  7:41 AM 11/18/2022

## 2022-11-18 NOTE — Progress Notes (Signed)
Anesthesia Chart Review: SAME DAY WORK-UP  Case: [id number] Date/Time: 11/19/22 0956   Procedures:      RIGHT COMMON ENDARTERECTOMY FEMORAL (Right)     RIGHT LOWER EXTERMITY AORTOGRAM WITH POSSIBLE STENTING (Right)     POSSIBLE ABOVE KNEE BYPASS (Right)   Anesthesia type: Choice   Pre-op diagnosis: Atherosclerosis of native artery of right lower extremity with gangrene   [facility]: [facility] ROOM 16 / [facility]   Surgeons: Maeola Harman, MD       DISCUSSION: Patient is a 77 year old male scheduled for the above procedure. Procedure was initially recommended following 05/27/22 aortogram with RLE runoff for evaluation of RLE limb threatening ischemia with ulceration. Surgery was scheduled for 09/06/22 but he reported progressive exertional angina. He has since undergone DES LAD 09/18/22. He also had known CTO distal RCA but noted to now have a channel which might facilitate CTO PCI and later underwent overlapping DES to distal RCA, mid RCA, and rotational atherectomy with DES proximal RCA 10/10/22. Surgery was rescheduled for 10/29/22, but cancelled and instead admitted for acute CHF exacerbation, as well as RLE rash with skin weeping.   Surgery is now rescheduled for 11/19/22. Per evaluation by Dr. Randie Heinz on 11/13/22, "77 year old male originally planned for bypass for small wound on his foot now with nonhealing rash after antibiotics and Plavix from coronary artery stenting. He is now on Brilinta and aspirin we will need to continue this. I reviewed reviewed his angiogram and discussed the patient and his wife. Plan will be for right common femoral endarterectomy with right lower extremity angiography possibly above-the-knee bypass with or without stenting and possibly stenting of the entire right lower extremity. If no stent or bypass is possible above the knee we will plan for just common femoral endarterectomy to see if we can get the leg to heal. I would not want to make any below-knee decision as I have  serious concerns of infection and/or nonhealing which would lead to possible above-the-knee amputation. I discussed this with the patient's wife they demonstrate good understanding we will get him scheduled ASAP. I have sent doxycycline to his pharmacy."  Other history includes former smoker (quit 05/06/04), HTN, HLD, CAD (abnormal stress test 10/09/11 with evidence of previous large anteroseptal and inferoseptal infarct with severe hypokinesis, EF 30%, moderate-severe 2V CAD 10/15/11 LHC, treated medically; DES LAD 09/18/22 for progressive angina; overlapping DES RCA x3 10/10/22), ischemic cardiomyopathy, left BBB with intermittent CHB (s/p dual chamber CRT-ICD St. Jude 07/14/12, generator change 10/25/20), afib, AAA (s/p EVAR 12/03/11), PAD, DM2, idiopathic pulmonary fibrosis (IPF), dyspnea, pancreatitis, GERD, CKD (stage III), spinal surgery (C2-4 fusion).   Last cardiology visit was on 11/12/22 by Robbie Lis, PA-C post-hospitalization. Euvolemic on exam and by CorVue Impedence, 99% paced, No VT/VF. Continue Lasix 40 mg daily. No spironolactone given mild hyperkalemia. Avoiding SGLT2i given severe xerosis cutis and chronic wounds, especially right ischemic leg. Continue Coreg. Also on valsartan and Imdur. She discussed fluid restriction. Plan to follow-up in HF Clinic. No chest pain. On statin and DAPT with ASA and Effient. Noted that he was awaiting RLE vascular surgery.   EP Perioperative ICD Recommendations: Device Information: Clinic EP Physician:  Sherryl Manges, MD   Device Type:  Defibrillator Manufacturer and Phone #:  St. Jude/[employer]ott: [phone] Pacemaker Dependent?:  Yes.   Date of Last Device Check:  11/12/22 Normal Device Function?:  Yes.     Electrophysiologist's Recommendations: Have magnet available. Provide continuous ECG monitoring when magnet is used  or reprogramming is to be performed.  Procedure should not interfere with device function.  No device programming or magnet placement  needed.   He is also followed by pulmonologist Dr. Marchelle Gearing for idiopathic pulmonary fibrosis, diagnosed around November 2022. Last visit 07/29/22. His 03/2022 chest CT and 07/2022 PFTs were felt to be stable. Walking desaturation test also stable. He was tolerating prifenidone then, although he opted to hold given stable testing. He was not on home O2 and was using home exercise bike. Dr. Marchelle Gearing wished him well with leg surgery. He remains on Incruse Elliopta daily.   He is a same day work-up, so labs and anesthesia team evaluation on the day of surgery. As above, he is to continue DAPT.   VS: Ht 6\' 2"  (1.88 m)   Wt 78.5 kg   BMI 22.22 kg/m  BP Readings from Last 3 Encounters:  11/13/22 (!) 149/70  11/12/22 128/68  11/08/22 110/60   Pulse Readings from Last 3 Encounters:  11/13/22 75  11/12/22 78  11/08/22 78     PROVIDERS: [name], MD is PCP  Donato Schultz, MD is cardiologist Sherryl Manges, MD is EP cardiologist Samara Deist, MD is pulmonologist Gala Lewandowsky, DPM is podiatrist   LABS: Most recent lab results in [facility] include: Lab Results  Component Value Date   WBC 15.1 (H) 11/08/2022   HGB 12.9 (L) 11/08/2022   HCT 38.8 11/08/2022   PLT 399 11/08/2022   GLUCOSE 187 (H) 11/08/2022   CHOL 125 11/01/2022   TRIG 215 (H) 11/01/2022   HDL 31 (L) 11/01/2022   LDLDIRECT 53 11/01/2022   LDLCALC 51 11/01/2022   ALT 19 11/08/2022   AST 21 11/08/2022   NA 137 11/08/2022   K 5.0 11/08/2022   CL 99 11/08/2022   CREATININE 1.40 (H) 11/08/2022   BUN 31 (H) 11/08/2022   CO2 21 11/08/2022   INR 1.2 08/30/2022   HGBA1C 7.9 (H) 10/23/2022   Lab Results  Component Value Date   CREATININE 1.40 (H) 11/08/2022   CREATININE 1.16 11/01/2022   CREATININE 1.36 (H) 10/31/2022     OTHER: PFTS 07/29/22: FVC 3.83 (83%), FEV1 2.56 (77%), FEV/FVC .67 (92%), FEF 25-75 1.38 (57%), DLCO unc/cor 11.69 (43%). Conclusion: In view of the severity of the diffusion defect,  studies with exercise would be helpful to evaluate the presence of hypoxemia.  Pulmonary Function Diagnosis: Minimal Obstructive Airways Disease - Peripheral Airway.     Simple office walk 185 feet x  3 laps goal with forehead probe 01/02/2021   08/08/2021   01/10/2022   07/29/2022    O2 used ra ra ra ra  Number laps completed 3 3 2 3   Comments about pace avg        Resting Pulse Ox/HR 97% and 71/min 98% and HR 71 98% and HR66 98% and HR 65  Final Pulse Ox/HR 95% and 94/min 91% and HR 86 93% and HR 91 92% an dHR 84  Desaturated </= 88% no no No no  Desaturated <= 3% points no Yes, 7 poins Yes 5 ponts Yes 6 potn  Got Tachycardic >/= 90/min yes no yes no  Symptoms at end of test none none none none  Miscellaneous comments x              IMAGES: 1V PCXR 10/29/22: IMPRESSION: 1. Background mid to lower zonal subpleural fibrosis and ground-glass disease. No new lung opacity. 2. COPD and basal-predominant UIP-pattern fibrosis. 3. Aortic atherosclerosis.  CT  Chest High Resolution 03/27/22: IMPRESSION: 1. Right lower lobe lower lobe pulmonary nodules are no longer present, compatible with resolved infectious or inflammatory process. 2. Subpleural and lower lung predominant fibrotic interstitial lung disease with traction bronchiectasis and honeycomb change. No evidence of progression. Findings are consistent with UIP per consensus guidelines: Diagnosis of Idiopathic Pulmonary Fibrosis: An Official ATS/ERS/JRS/ALAT Clinical Practice Guideline. Am Rosezetta Schlatter Crit Care Med Vol 198, Iss 5, [phone], Jan 04 2017. 3. Aortic Atherosclerosis (ICD10-I70.0) and Emphysema (ICD10-J43.9).     EKG: 10/29/22: V-paced rhythm. Baseline artifact.     CV: Echo 10/30/22: IMPRESSIONS   1. Left ventricular ejection fraction, by estimation, is 60 to 65%. The  left ventricle has normal function. The left ventricle has no regional  wall motion abnormalities. Left ventricular diastolic parameters are   consistent with Grade I diastolic  dysfunction (impaired relaxation).   2. Right ventricular systolic function is normal. The right ventricular  size is mildly enlarged. There is normal pulmonary artery systolic  pressure. The estimated right ventricular systolic pressure is 26.4 mmHg.   3. The mitral valve is grossly normal. Trivial mitral valve  regurgitation. No evidence of mitral stenosis.   4. The aortic valve is tricuspid. Aortic valve regurgitation is not  visualized. Aortic valve sclerosis/calcification is present, without any  evidence of aortic stenosis.   5. The inferior vena cava is normal in size with greater than 50%  respiratory variability, suggesting right atrial pressure of 3 mmHg.  - Comparison(s): Changes from prior study are noted. The left ventricular  function has improved. [07/17/21: LVEF 40-45%, inferior and posterior wall akinesis, grade 1 DD, mildly enlarged RV, normal RVSF, normal PASP, trivial MR; 07/16/17: LVEF 55-60%, normal wall motion, grade 1 DD, mild MR, moderately increased PASP, PA peak pressure 40 mmHg]   Cardiac cath 10/10/22:   Dist RCA lesion is 99% stenosed, functionally a chronic total occlusion.  A drug-eluting stent was successfully placed using a SYNERGY XD 2.50X24, postdilated to 2.9 mm and optimized with intravascular ultrasound.   Post intervention, there is a 0% residual stenosis.   Mid RCA lesion is 75% stenosed.  A drug-eluting stent was successfully placed using a SYNERGY XD 3.0X16, postdilated to 3.5 mm and optimized with intravascular ultrasound.   Post intervention, there is a 0% residual stenosis.   Prox RCA lesion is 75% stenosed.  Severely calcified.  After rotational atherectomy, A drug-eluting stent was successfully placed using a STENT MEGATRON 3.5X28, postdilated to > 3.5 mm and optimized with intravascular ultrasound.   Prox RCA to Mid RCA lesion is 25% stenosed with 25% stenosed side branch in RV Branch.   Post intervention, there  is a 0% residual stenosis.   The left ventricular systolic function is normal.   LV end diastolic pressure is normal.   The left ventricular ejection fraction is 55-65% by visual estimate.   There is no aortic valve stenosis.   In the absence of any other complications or medical issues, we expect the patient to be ready for discharge from an interventional cardiology perspective on 10/11/2022.   Recommend uninterrupted dual antiplatelet therapy with Aspirin 81mg  daily and Ticagrelor 90mg  twice daily for given diffuse disease, would plan longer P2Y12 therapy.   Successful CTO PCI of the RCA with stents placed in the distal, mid and proximal RCA.  The distal and mid stents overlap.   The patient will be watched overnight.  Results conveyed to his wife.   He continues to have a right foot  wound with planned vascular surgery in a few weeks.  Will have wound care evaluate the patient in the hospital.   Would plan for prolonged more intense antiplatelet therapy given his diffuse CAD and multiple stents.   [place] EVAR 05/23/22: Summary:  Abdominal Aorta: The largest aortic diameter remains essentially unchanged  compared to prior duplex exam. Previous diameter measurement was 3.1 cm  obtained on 04/12/2021. Prior CT 08/21/2021 measured sac at 3.3 cm.        US Carotid 05/23/17: Final Interpretation:  - Right Carotid: Velocities in the right ICA are consistent with a 1-39%  stenosis. The extracranial vessels were near-normal with only minimal  wall  thickening or plaque.  - Left Carotid: Velocities in the left ICA are consistent with a 1-39%  stenosis. The extracranial vessels were near-normal with only minimal  wall thickening or plaque.  - Vertebrals:  Both vertebral arteries were patent with antegrade flow.  - Subclavians: Normal flow hemodynamics were seen in bilateral subclavian arteries.    Past Medical History:  Diagnosis Date   AAA (abdominal aortic aneurysm) (HCC) 2011   Per vascular  surgery   Atrial fibrillation (HCC)    CAD (coronary artery disease)    Presumed CAD with nuclear scan October 09, 2011,  large anteroseptal MI and inferior MI. Catheterization scheduled October 15, 2011   Cardiomyopathy [facility])    Nuclear, October 09, 2011, EF 30%, multiple focal wall motion abnormalities   Chronic kidney disease    CKD3   COVID-19    2021   Diabetes mellitus    type II   GERD (gastroesophageal reflux disease)    Silent   HLD (hyperlipidemia)    Hypertension    white coat HTN-- often elevated in office and controlled on outside checks.   ICD (implantable cardioverter-defibrillator) in place    CRT-D placed March, 2014 complete heart block and k dysfunction   IPF (idiopathic pulmonary fibrosis) (HCC)    LBBB (left bundle branch block)    LBBB on EKG October 11, 2011,  no prior EKG has been done   Low testosterone    Hx of   On home oxygen therapy    2L as needed   Pacemaker    PAD (peripheral artery disease) (HCC)    Pancreatitis    Umbilical hernia     Past Surgical History:  Procedure Laterality Date   ABDOMINAL AORTAGRAM N/A 10/28/2011   Procedure: ABDOMINAL Ronny Flurry;  Surgeon: [name], MD;  Location: [facility] CATH LAB;  Service: Cardiovascular;  Laterality: N/A;   ABDOMINAL AORTIC ANEURYSM REPAIR     EVAR    ABDOMINAL AORTOGRAM N/A 05/27/2022   Procedure: ABDOMINAL AORTOGRAM;  Surgeon: Maeola Harman, MD;  Location: [facility] INVASIVE CV LAB;  Service: Cardiovascular;  Laterality: N/A;   BI-VENTRICULAR IMPLANTABLE CARDIOVERTER DEFIBRILLATOR N/A 07/16/2012   Procedure: BI-VENTRICULAR IMPLANTABLE CARDIOVERTER DEFIBRILLATOR  (CRT-D);  Surgeon: Duke Salvia, MD;  Location: [facility] CATH LAB;  Service: Cardiovascular;  Laterality: N/A;   BIV ICD GENERATOR CHANGEOUT N/A 10/25/2020   Procedure: BIV ICD GENERATOR CHANGEOUT;  Surgeon: Duke Salvia, MD;  Location: [facility] INVASIVE CV LAB;  Service: Cardiovascular;  Laterality: N/A;   CARDIAC CATHETERIZATION     CATARACT  EXTRACTION Left 2023   COLONOSCOPY  03/23/2018; 2020   CORONARY CTO INTERVENTION N/A 10/10/2022   Procedure: CORONARY CTO INTERVENTION;  Surgeon: Corky Crafts, MD;  Location: [facility] INVASIVE CV LAB;  Service: Cardiovascular;  Laterality: N/A;   CORONARY LITHOTRIPSY N/A  09/18/2022   Procedure: CORONARY LITHOTRIPSY;  Surgeon: Corky Crafts, MD;  Location: [facility] INVASIVE CV LAB;  Service: Cardiovascular;  Laterality: N/A;   CORONARY STENT INTERVENTION N/A 09/18/2022   Procedure: CORONARY STENT INTERVENTION;  Surgeon: Corky Crafts, MD;  Location: [facility] INVASIVE CV LAB;  Service: Cardiovascular;  Laterality: N/A;   CORONARY ULTRASOUND/IVUS N/A 09/18/2022   Procedure: Coronary Ultrasound/IVUS;  Surgeon: Corky Crafts, MD;  Location: [facility] INVASIVE CV LAB;  Service: Cardiovascular;  Laterality: N/A;   ESOPHAGOGASTRODUODENOSCOPY N/A 02/12/2019   Procedure: ESOPHAGOGASTRODUODENOSCOPY (EGD);  Surgeon: Lynann Bologna, MD;  Location: [facility] ENDOSCOPY;  Service: Endoscopy;  Laterality: N/A;   ESOPHAGOGASTRODUODENOSCOPY (EGD) WITH PROPOFOL N/A 02/05/2018   Procedure: ESOPHAGOGASTRODUODENOSCOPY (EGD) WITH PROPOFOL;  Surgeon: Napoleon Form, MD;  Location: [facility] ENDOSCOPY;  Service: Endoscopy;  Laterality: N/A;   ESOPHAGOGASTRODUODENOSCOPY (EGD) WITH PROPOFOL N/A 09/27/2018   Procedure: ESOPHAGOGASTRODUODENOSCOPY (EGD) WITH PROPOFOL;  Surgeon: Jeani Hawking, MD;  Location: [facility] ENDOSCOPY;  Service: Endoscopy;  Laterality: N/A;   FOREIGN BODY REMOVAL  09/27/2018   Procedure: FOREIGN BODY REMOVAL;  Surgeon: Jeani Hawking, MD;  Location: [facility] ENDOSCOPY;  Service: Endoscopy;;   FOREIGN BODY REMOVAL  02/12/2019   Procedure: FOREIGN BODY REMOVAL;  Surgeon: Lynann Bologna, MD;  Location: [facility] ENDOSCOPY;  Service: Endoscopy;;   HERNIA REPAIR  05/14/2013   INSERTION OF MESH N/A 05/14/2013   Procedure: INSERTION OF MESH;  Surgeon: Ardeth Sportsman, MD;  Location: [facility] OR;  Service: General;  Laterality: N/A;   LEFT  HEART CATH N/A 10/10/2022   Procedure: Left Heart Cath;  Surgeon: Corky Crafts, MD;  Location: [facility] INVASIVE CV LAB;  Service: Cardiovascular;  Laterality: N/A;   LEFT HEART CATH AND CORONARY ANGIOGRAPHY N/A 07/17/2021   Procedure: LEFT HEART CATH AND CORONARY ANGIOGRAPHY;  Surgeon: Lennette Bihari, MD;  Location: [facility] INVASIVE CV LAB;  Service: Cardiovascular;  Laterality: N/A;   LEFT HEART CATH AND CORONARY ANGIOGRAPHY N/A 09/18/2022   Procedure: LEFT HEART CATH AND CORONARY ANGIOGRAPHY;  Surgeon: Corky Crafts, MD;  Location: [facility] INVASIVE CV LAB;  Service: Cardiovascular;  Laterality: N/A;   LOWER EXTREMITY ANGIOGRAM Bilateral 10/28/2011   Procedure: LOWER EXTREMITY ANGIOGRAM;  Surgeon: [name], MD;  Location: [facility] CATH LAB;  Service: Cardiovascular;  Laterality: Bilateral;   LOWER EXTREMITY ANGIOGRAPHY  05/27/2022   Procedure: Lower Extremity Angiography;  Surgeon: Maeola Harman, MD;  Location: [facility] INVASIVE CV LAB;  Service: Cardiovascular;;   PACEMAKER INSERTION  07/16/2012   pacemaker/defibrilator   POLYPECTOMY     POSTERIOR CERVICAL FUSION/FORAMINOTOMY N/A 06/09/2014   Procedure: Laminectomy - Cervical two-Cervcial four posterior cervical instrumented fusion Cervical two-cervical four;  Surgeon: Tia Alert, MD;  Location: [facility] NEURO ORS;  Service: Neurosurgery;  Laterality: N/A;  posterior    SAVORY DILATION N/A 02/05/2018   Procedure: SAVORY DILATION;  Surgeon: Napoleon Form, MD;  Location: [facility] ENDOSCOPY;  Service: Endoscopy;  Laterality: N/A;   TONSILLECTOMY     as a child    UMBILICAL HERNIA REPAIR N/A 05/14/2013   Procedure: LAPAROSCOPIC exploration and repair of hernia in abdominal ;  Surgeon: Ardeth Sportsman, MD;  Location: [facility] OR;  Service: General;  Laterality: N/A;   UPPER GASTROINTESTINAL ENDOSCOPY  2020    MEDICATIONS: No current facility-administered medications for this encounter.    albuterol (VENTOLIN HFA) 108 (90 Base) MCG/ACT  inhaler   amLODipine (NORVASC) 10 MG tablet   aspirin EC 81 MG tablet   carvedilol (COREG) 25 MG tablet  cetirizine (ZYRTEC) 10 MG tablet   Cholecalciferol (VITAMIN D3) 50 MCG (2000 UT) TABS   Cyanocobalamin (VITAMIN B-12 PO)   doxycycline (VIBRAMYCIN) 100 MG capsule   furosemide (LASIX) 40 MG tablet   glipiZIDE (GLUCOTROL) 5 MG tablet   insulin glargine (LANTUS SOLOSTAR) 100 UNIT/ML Solostar Pen   isosorbide mononitrate (IMDUR) 30 MG 24 hr tablet   metFORMIN (GLUCOPHAGE) 500 MG tablet   Multiple Vitamin (MULTIVITAMIN WITH MINERALS) TABS tablet   pantoprazole (PROTONIX) 40 MG tablet   prasugrel (EFFIENT) 10 MG TABS tablet   simvastatin (ZOCOR) 20 MG tablet   umeclidinium bromide (INCRUSE ELLIPTA) 62.5 MCG/ACT AEPB   valsartan (DIOVAN) 40 MG tablet   glucose blood (ACCU-CHEK AVIVA PLUS) test strip   mupirocin ointment (BACTROBAN) 2 %    Shonna Chock, PA-C Surgical Short Stay/Anesthesiology [facility] Phone [phone] [facility] Phone [phone] 11/18/2022 12:32 PM

## 2022-11-18 NOTE — Pre-Procedure Instructions (Addendum)
PCP - [name], MD Cardiologist - Jake Bathe, MD  PPM/ICD - ICD [employer]. Jude Device Orders - In notes Rep Notified - Yes on 11/18/22 see note written by N. Argentina, RN  Chest x-ray - 10/29/22 EKG - 10/29/22 ECHO - 10/30/22 Cardiac Cath - 10/10/22  Pt is on home 2L O2 as needed  Fasting Blood Sugar - 80-140 Checks Blood Sugar 2/day  Blood Thinner Instructions: Effient continue Aspirin Instructions: Continue  Anesthesia review: Y  Patient verbally denies any shortness of breath, fever, cough and chest pain during phone call   -------------  SDW INSTRUCTIONS given:  Your procedure is scheduled on Tuesday, July 16th.  Report to [facility] Main Entrance "A" at 0730 A.M., and check in at the Admitting office.  Call this number if you have problems the morning of surgery:  [phone]   Remember:  Do not eat or drink after midnight the night before your surgery     Take these medicines the morning of surgery with A SIP OF WATER  amLODipine (NORVASC)  aspirin  carvedilol (COREG)  cetirizine (ZYRTEC)  doxycycline (VIBRAMYCIN)  isosorbide mononitrate (IMDUR)  pantoprazole (PROTONIX)  prasugrel (EFFIENT)  umeclidinium bromide (INCRUSE ELLIPTA)  albuterol (VENTOLIN HFA)-if needed (Please bring on the day of surgery)  **insulin glargine (LANTUS SOLOSTAR)** 1/2 of your normal dose at bedtime  ** PLEASE check your blood sugar the morning of your surgery when you wake up and every 2 hours until you get to the Short Stay unit.  If your blood sugar is less than 70 mg/dL, you will need to treat for low blood sugar: Do not take insulin. Treat a low blood sugar (less than 70 mg/dL) with  cup of clear juice (cranberry or apple), 4 glucose tablets, OR glucose gel. Recheck blood sugar in 15 minutes after treatment (to make sure it is greater than 70 mg/dL). If your blood sugar is not greater than 70 mg/dL on recheck, call [phone] for further instructions.   As of  today, STOP taking any Aspirin (unless otherwise instructed by your surgeon) Aleve, Naproxen, Ibuprofen, Motrin, Advil, Goody's, BC's, all herbal medications, fish oil, and all vitamins.                      Do not wear jewelry, make up, or nail polish            Do not wear lotions, powders, perfumes/colognes, or deodorant.            Do not shave 48 hours prior to surgery.  Men may shave face and neck.            Do not bring valuables to the hospital.            [facility] is not responsible for any belongings or valuables.  Do NOT Smoke (Tobacco/Vaping) 24 hours prior to your procedure If you use a CPAP at night, you may bring all equipment for your overnight stay.   Contacts, glasses, dentures or bridgework may not be worn into surgery.      For patients admitted to the hospital, discharge time will be determined by your treatment team.   Patients discharged the day of surgery will not be allowed to drive home, and someone needs to stay with them for 24 hours.    Special instructions:   [place]- Preparing For Surgery  Before surgery, you can play an important role. Because skin is not sterile, your skin needs  to be as free of germs as possible. You can reduce the number of germs on your skin by washing with CHG (chlorahexidine gluconate) Soap before surgery.  CHG is an antiseptic cleaner which kills germs and bonds with the skin to continue killing germs even after washing.    Oral Hygiene is also important to reduce your risk of infection.  Remember - BRUSH YOUR TEETH THE MORNING OF SURGERY WITH YOUR REGULAR TOOTHPASTE  Please do not use if you have an allergy to CHG or antibacterial soaps. If your skin becomes reddened/irritated stop using the CHG.  Do not shave (including legs and underarms) for at least 48 hours prior to first CHG shower. It is OK to shave your face.  Please follow these instructions carefully.   Shower the NIGHT BEFORE SURGERY and the MORNING OF SURGERY  with DIAL Soap.   Pat yourself dry with a CLEAN TOWEL.  Wear CLEAN PAJAMAS to bed the night before surgery  Place CLEAN SHEETS on your bed the night of your first shower and DO NOT SLEEP WITH PETS.   Day of Surgery: Please shower morning of surgery  Wear Clean/Comfortable clothing the morning of surgery Do not apply any deodorants/lotions.   Remember to brush your teeth WITH YOUR REGULAR TOOTHPASTE.   Questions were answered. Patient verbalized understanding of instructions.

## 2022-11-18 NOTE — Progress Notes (Signed)
Notified [employer] rep, Kerry Fort, of patient's surgery tomorrow. Arlys John aware that pt is having right common femoral endarterectomy by Dr. Randie Heinz at 1011 and patient will arrive at 0730. Arlys John stated that he will have someone here around 0830 to check on patient. Device orders requested from [facility].

## 2022-11-18 NOTE — Anesthesia Preprocedure Evaluation (Signed)
Anesthesia Evaluation  Patient identified by MRN, date of birth, ID band Patient awake    Reviewed: Allergy & Precautions, NPO status , Patient's Chart, lab work & pertinent test results, reviewed documented beta blocker date and time   History of Anesthesia Complications Negative for: history of anesthetic complications  Airway Mallampati: II  TM Distance: >3 FB Neck ROM: Full    Dental  (+) Edentulous Upper, Edentulous Lower   Pulmonary COPD (rarely uses home O2),  COPD inhaler, former smoker Pulm fibrosis   breath sounds clear to auscultation       Cardiovascular hypertension, Pt. on medications and Pt. on home beta blockers (-) angina + CAD, + Cardiac Stents and + Peripheral Vascular Disease  + dysrhythmias Atrial Fibrillation + pacemaker (BiV pacer) + Cardiac Defibrillator  Rhythm:Regular Rate:Normal  10/2022 ECHO: EF 60-65%, normal LVF, Grade 1 DD, normal RVF, trivial MR  10/2022 Cath: successful RCA stenting,  -  The left ventricular systolic function is normal.   LV end diastolic pressure is normal.   The left ventricular ejection fraction is 55-65% by visual estimate.   There is no aortic valve stenosis.    Neuro/Psych negative neurological ROS     GI/Hepatic Neg liver ROS,GERD  Medicated and Controlled,,  Endo/Other  diabetes (glu 124), Oral Hypoglycemic Agents, Insulin Dependent    Renal/GU Renal InsufficiencyRenal disease     Musculoskeletal   Abdominal   Peds  Hematology Plt 399k, Hb 12.9   Anesthesia Other Findings   Reproductive/Obstetrics                             Anesthesia Physical Anesthesia Plan  ASA: 4  Anesthesia Plan: General   Post-op Pain Management: Tylenol PO (pre-op)*   Induction: Intravenous  PONV Risk Score and Plan: 2 and Ondansetron and Dexamethasone  Airway Management Planned: Oral ETT  Additional Equipment: Arterial line  Intra-op Plan:    Post-operative Plan: Extubation in OR  Informed Consent: I have reviewed the patients History and Physical, chart, labs and discussed the procedure including the risks, benefits and alternatives for the proposed anesthesia with the patient or authorized representative who has indicated his/her understanding and acceptance.     Dental advisory given  Plan Discussed with: CRNA and Surgeon  Anesthesia Plan Comments: (See PAT note written 11/18/2022 by Shonna Chock, PA-C.  )       Anesthesia Quick Evaluation

## 2022-11-19 ENCOUNTER — Encounter (HOSPITAL_COMMUNITY)
Admission: RE | Disposition: A | Discharge status: Home Health | Payer: Self-pay | Source: Home / Self Care | Attending: Vascular Surgery

## 2022-11-19 ENCOUNTER — Inpatient Hospital Stay (HOSPITAL_COMMUNITY): Payer: Medicare Other | Admitting: Vascular Surgery

## 2022-11-19 ENCOUNTER — Inpatient Hospital Stay (HOSPITAL_COMMUNITY)
Admission: RE | Admit: 2022-11-19 | Discharge: 2022-11-21 | DRG: 271 | Disposition: A | Discharge status: Home Health | Payer: Medicare Other | Attending: Vascular Surgery | Admitting: Vascular Surgery

## 2022-11-19 ENCOUNTER — Other Ambulatory Visit: Payer: Self-pay

## 2022-11-19 ENCOUNTER — Inpatient Hospital Stay (HOSPITAL_COMMUNITY): Payer: Medicare Other

## 2022-11-19 ENCOUNTER — Encounter (HOSPITAL_COMMUNITY): Payer: Self-pay | Admitting: Vascular Surgery

## 2022-11-19 ENCOUNTER — Inpatient Hospital Stay (HOSPITAL_COMMUNITY): Payer: Self-pay | Admitting: Vascular Surgery

## 2022-11-19 DIAGNOSIS — Z8249 Family history of ischemic heart disease and other diseases of the circulatory system: Secondary | ICD-10-CM

## 2022-11-19 DIAGNOSIS — Z885 Allergy status to narcotic agent status: Secondary | ICD-10-CM

## 2022-11-19 DIAGNOSIS — E1151 Type 2 diabetes mellitus with diabetic peripheral angiopathy without gangrene: Secondary | ICD-10-CM | POA: Diagnosis present

## 2022-11-19 DIAGNOSIS — I429 Cardiomyopathy, unspecified: Secondary | ICD-10-CM | POA: Diagnosis present

## 2022-11-19 DIAGNOSIS — Z87891 Personal history of nicotine dependence: Secondary | ICD-10-CM | POA: Diagnosis not present

## 2022-11-19 DIAGNOSIS — E1165 Type 2 diabetes mellitus with hyperglycemia: Secondary | ICD-10-CM | POA: Diagnosis present

## 2022-11-19 DIAGNOSIS — I700 Atherosclerosis of aorta: Secondary | ICD-10-CM | POA: Diagnosis present

## 2022-11-19 DIAGNOSIS — Z79899 Other long term (current) drug therapy: Secondary | ICD-10-CM | POA: Diagnosis not present

## 2022-11-19 DIAGNOSIS — I442 Atrioventricular block, complete: Secondary | ICD-10-CM | POA: Diagnosis present

## 2022-11-19 DIAGNOSIS — I2510 Atherosclerotic heart disease of native coronary artery without angina pectoris: Secondary | ICD-10-CM | POA: Diagnosis present

## 2022-11-19 DIAGNOSIS — I998 Other disorder of circulatory system: Secondary | ICD-10-CM | POA: Diagnosis not present

## 2022-11-19 DIAGNOSIS — I70261 Atherosclerosis of native arteries of extremities with gangrene, right leg: Secondary | ICD-10-CM | POA: Diagnosis not present

## 2022-11-19 DIAGNOSIS — N183 Chronic kidney disease, stage 3 unspecified: Secondary | ICD-10-CM

## 2022-11-19 DIAGNOSIS — E785 Hyperlipidemia, unspecified: Secondary | ICD-10-CM | POA: Diagnosis present

## 2022-11-19 DIAGNOSIS — E1122 Type 2 diabetes mellitus with diabetic chronic kidney disease: Secondary | ICD-10-CM | POA: Diagnosis present

## 2022-11-19 DIAGNOSIS — Z9981 Dependence on supplemental oxygen: Secondary | ICD-10-CM

## 2022-11-19 DIAGNOSIS — D62 Acute posthemorrhagic anemia: Secondary | ICD-10-CM | POA: Diagnosis not present

## 2022-11-19 DIAGNOSIS — J439 Emphysema, unspecified: Secondary | ICD-10-CM | POA: Diagnosis present

## 2022-11-19 DIAGNOSIS — I129 Hypertensive chronic kidney disease with stage 1 through stage 4 chronic kidney disease, or unspecified chronic kidney disease: Secondary | ICD-10-CM | POA: Diagnosis present

## 2022-11-19 DIAGNOSIS — I110 Hypertensive heart disease with heart failure: Secondary | ICD-10-CM | POA: Diagnosis not present

## 2022-11-19 DIAGNOSIS — K219 Gastro-esophageal reflux disease without esophagitis: Secondary | ICD-10-CM | POA: Diagnosis present

## 2022-11-19 DIAGNOSIS — Z794 Long term (current) use of insulin: Secondary | ICD-10-CM | POA: Diagnosis not present

## 2022-11-19 DIAGNOSIS — J84112 Idiopathic pulmonary fibrosis: Secondary | ICD-10-CM | POA: Diagnosis present

## 2022-11-19 DIAGNOSIS — I130 Hypertensive heart and chronic kidney disease with heart failure and stage 1 through stage 4 chronic kidney disease, or unspecified chronic kidney disease: Secondary | ICD-10-CM | POA: Diagnosis not present

## 2022-11-19 DIAGNOSIS — Z95810 Presence of automatic (implantable) cardiac defibrillator: Secondary | ICD-10-CM | POA: Diagnosis not present

## 2022-11-19 DIAGNOSIS — Z955 Presence of coronary angioplasty implant and graft: Secondary | ICD-10-CM

## 2022-11-19 DIAGNOSIS — Z888 Allergy status to other drugs, medicaments and biological substances status: Secondary | ICD-10-CM

## 2022-11-19 DIAGNOSIS — E875 Hyperkalemia: Secondary | ICD-10-CM | POA: Diagnosis present

## 2022-11-19 DIAGNOSIS — Z83438 Family history of other disorder of lipoprotein metabolism and other lipidemia: Secondary | ICD-10-CM

## 2022-11-19 DIAGNOSIS — L853 Xerosis cutis: Secondary | ICD-10-CM | POA: Diagnosis present

## 2022-11-19 DIAGNOSIS — I5023 Acute on chronic systolic (congestive) heart failure: Secondary | ICD-10-CM

## 2022-11-19 DIAGNOSIS — R21 Rash and other nonspecific skin eruption: Secondary | ICD-10-CM | POA: Diagnosis present

## 2022-11-19 DIAGNOSIS — I739 Peripheral vascular disease, unspecified: Secondary | ICD-10-CM | POA: Diagnosis not present

## 2022-11-19 DIAGNOSIS — Z981 Arthrodesis status: Secondary | ICD-10-CM

## 2022-11-19 DIAGNOSIS — J449 Chronic obstructive pulmonary disease, unspecified: Secondary | ICD-10-CM | POA: Diagnosis not present

## 2022-11-19 DIAGNOSIS — Z7982 Long term (current) use of aspirin: Secondary | ICD-10-CM

## 2022-11-19 DIAGNOSIS — Z8616 Personal history of COVID-19: Secondary | ICD-10-CM | POA: Diagnosis not present

## 2022-11-19 DIAGNOSIS — I252 Old myocardial infarction: Secondary | ICD-10-CM

## 2022-11-19 DIAGNOSIS — I70221 Atherosclerosis of native arteries of extremities with rest pain, right leg: Secondary | ICD-10-CM

## 2022-11-19 DIAGNOSIS — Z881 Allergy status to other antibiotic agents status: Secondary | ICD-10-CM

## 2022-11-19 DIAGNOSIS — Z7984 Long term (current) use of oral hypoglycemic drugs: Secondary | ICD-10-CM

## 2022-11-19 DIAGNOSIS — Z7902 Long term (current) use of antithrombotics/antiplatelets: Secondary | ICD-10-CM

## 2022-11-19 DIAGNOSIS — Z833 Family history of diabetes mellitus: Secondary | ICD-10-CM

## 2022-11-19 HISTORY — DX: Dependence on supplemental oxygen: Z99.81

## 2022-11-19 HISTORY — DX: COVID-19: U07.1

## 2022-11-19 HISTORY — PX: ENDARTERECTOMY FEMORAL: SHX5804

## 2022-11-19 HISTORY — PX: PATCH ANGIOPLASTY: SHX6230

## 2022-11-19 HISTORY — PX: LOWER EXTREMITY ANGIOGRAM: SHX5508

## 2022-11-19 LAB — GLUCOSE, CAPILLARY
Glucose-Capillary: 124 mg/dL — ABNORMAL HIGH (ref 70–99)
Glucose-Capillary: 147 mg/dL — ABNORMAL HIGH (ref 70–99)
Glucose-Capillary: 174 mg/dL — ABNORMAL HIGH (ref 70–99)
Glucose-Capillary: 176 mg/dL — ABNORMAL HIGH (ref 70–99)
Glucose-Capillary: 181 mg/dL — ABNORMAL HIGH (ref 70–99)
Glucose-Capillary: 188 mg/dL — ABNORMAL HIGH (ref 70–99)
Glucose-Capillary: 357 mg/dL — ABNORMAL HIGH (ref 70–99)
Glucose-Capillary: 400 mg/dL — ABNORMAL HIGH (ref 70–99)

## 2022-11-19 LAB — CBC
HCT: 36.1 % — ABNORMAL LOW (ref 39.0–52.0)
Hemoglobin: 12.1 g/dL — ABNORMAL LOW (ref 13.0–17.0)
MCH: 33 pg (ref 26.0–34.0)
MCHC: 33.5 g/dL (ref 30.0–36.0)
MCV: 98.4 fL (ref 80.0–100.0)
Platelets: 293 10*3/uL (ref 150–400)
RBC: 3.67 MIL/uL — ABNORMAL LOW (ref 4.22–5.81)
RDW: 13.5 % (ref 11.5–15.5)
WBC: 11.7 10*3/uL — ABNORMAL HIGH (ref 4.0–10.5)
nRBC: 0 % (ref 0.0–0.2)

## 2022-11-19 LAB — URINALYSIS, ROUTINE W REFLEX MICROSCOPIC
Bilirubin Urine: NEGATIVE
Glucose, UA: NEGATIVE mg/dL
Hgb urine dipstick: NEGATIVE
Ketones, ur: NEGATIVE mg/dL
Leukocytes,Ua: NEGATIVE
Nitrite: NEGATIVE
Protein, ur: NEGATIVE mg/dL
Specific Gravity, Urine: 1.02 (ref 1.005–1.030)
pH: 5.5 (ref 5.0–8.0)

## 2022-11-19 LAB — TYPE AND SCREEN
ABO/RH(D): A NEG
Antibody Screen: NEGATIVE

## 2022-11-19 LAB — CREATININE, SERUM
Creatinine, Ser: 1.12 mg/dL (ref 0.61–1.24)
GFR, Estimated: >60 mL/min (ref >60)

## 2022-11-19 LAB — POCT ACTIVATED CLOTTING TIME
Activated Clotting Time: 220 seconds
Activated Clotting Time: 171 seconds
Activated Clotting Time: 244 seconds

## 2022-11-19 SURGERY — ENDARTERECTOMY, FEMORAL
Anesthesia: General | Laterality: Right

## 2022-11-19 MED ORDER — ALBUMIN HUMAN 5 % IV SOLN: INTRAVENOUS | Status: DC | PRN

## 2022-11-19 MED ORDER — PROPOFOL 10 MG/ML IV BOLUS
INTRAVENOUS | Status: AC
  Filled 2022-11-19: qty 20

## 2022-11-19 MED ORDER — SODIUM CHLORIDE 0.9 % IV SOLN: INTRAVENOUS | Status: DC

## 2022-11-19 MED ORDER — 0.9 % SODIUM CHLORIDE (POUR BTL) OPTIME
TOPICAL | Status: DC | PRN
  Administered 2022-11-19: 2000 mL

## 2022-11-19 MED ORDER — FUROSEMIDE 40 MG PO TABS: 40.0000 mg | ORAL_TABLET | Freq: Every day | ORAL | Status: DC | PRN

## 2022-11-19 MED ORDER — SODIUM CHLORIDE 0.9 % IV SOLN: 500.0000 mL | Freq: Once | INTRAVENOUS | Status: DC | PRN

## 2022-11-19 MED ORDER — EPHEDRINE SULFATE-NACL 50-0.9 MG/10ML-% IV SOSY
PREFILLED_SYRINGE | INTRAVENOUS | Status: DC | PRN
  Administered 2022-11-19: 10 mg via INTRAVENOUS
  Administered 2022-11-19: 5 mg via INTRAVENOUS
  Administered 2022-11-19: 10 mg via INTRAVENOUS

## 2022-11-19 MED ORDER — HEPARIN 6000 UNIT IRRIGATION SOLUTION
Status: DC | PRN
  Administered 2022-11-19: 1

## 2022-11-19 MED ORDER — HEMOSTATIC AGENTS (NO CHARGE) OPTIME
TOPICAL | Status: DC | PRN
  Administered 2022-11-19: 1 via TOPICAL

## 2022-11-19 MED ORDER — PHENYLEPHRINE 80 MCG/ML (10ML) SYRINGE FOR IV PUSH (FOR BLOOD PRESSURE SUPPORT)
PREFILLED_SYRINGE | INTRAVENOUS | Status: DC | PRN
  Administered 2022-11-19 (×2): 160 ug via INTRAVENOUS
  Administered 2022-11-19: 240 ug via INTRAVENOUS
  Administered 2022-11-19: 160 ug via INTRAVENOUS
  Administered 2022-11-19: 80 ug via INTRAVENOUS

## 2022-11-19 MED ORDER — SUGAMMADEX SODIUM 200 MG/2ML IV SOLN
INTRAVENOUS | Status: DC | PRN
  Administered 2022-11-19: 200 mg via INTRAVENOUS

## 2022-11-19 MED ORDER — DOCUSATE SODIUM 100 MG PO CAPS
100.0000 mg | ORAL_CAPSULE | Freq: Every day | ORAL | Status: DC
  Administered 2022-11-20 – 2022-11-21 (×2): 100 mg via ORAL
  Filled 2022-11-19 (×2): qty 1

## 2022-11-19 MED ORDER — HEPARIN SODIUM (PORCINE) 1000 UNIT/ML IJ SOLN
INTRAMUSCULAR | Status: DC | PRN
  Administered 2022-11-19: 8000 [IU] via INTRAVENOUS
  Administered 2022-11-19: 4000 [IU] via INTRAVENOUS

## 2022-11-19 MED ORDER — HYDROMORPHONE HCL 1 MG/ML IJ SOLN: 0.5000 mg | INTRAMUSCULAR | Status: DC | PRN

## 2022-11-19 MED ORDER — FENTANYL CITRATE (PF) 250 MCG/5ML IJ SOLN
INTRAMUSCULAR | Status: DC | PRN
  Administered 2022-11-19: 50 ug via INTRAVENOUS
  Administered 2022-11-19: 150 ug via INTRAVENOUS
  Administered 2022-11-19: 50 ug via INTRAVENOUS

## 2022-11-19 MED ORDER — PANTOPRAZOLE SODIUM 40 MG PO TBEC
40.0000 mg | DELAYED_RELEASE_TABLET | Freq: Every day | ORAL | Status: DC
  Administered 2022-11-19 – 2022-11-21 (×3): 40 mg via ORAL
  Filled 2022-11-19 (×3): qty 1

## 2022-11-19 MED ORDER — ROCURONIUM BROMIDE 10 MG/ML (PF) SYRINGE
PREFILLED_SYRINGE | INTRAVENOUS | Status: AC
  Filled 2022-11-19: qty 10

## 2022-11-19 MED ORDER — INSULIN ASPART 100 UNIT/ML IJ SOLN
0.0000 [IU] | Freq: Three times a day (TID) | INTRAMUSCULAR | Status: DC
  Administered 2022-11-19: 2 [IU] via SUBCUTANEOUS
  Administered 2022-11-20: 3 [IU] via SUBCUTANEOUS

## 2022-11-19 MED ORDER — ACETAMINOPHEN 650 MG RE SUPP: 325.0000 mg | RECTAL | Status: DC | PRN

## 2022-11-19 MED ORDER — VASOPRESSIN 20 UNIT/ML IV SOLN
INTRAVENOUS | Status: AC
  Filled 2022-11-19: qty 1

## 2022-11-19 MED ORDER — PROPOFOL 10 MG/ML IV BOLUS
INTRAVENOUS | Status: DC | PRN
  Administered 2022-11-19: 20 mg via INTRAVENOUS
  Administered 2022-11-19: 50 mg via INTRAVENOUS
  Administered 2022-11-19: 80 mg via INTRAVENOUS

## 2022-11-19 MED ORDER — VASOPRESSIN 20 UNIT/ML IV SOLN
INTRAVENOUS | Status: DC | PRN
  Administered 2022-11-19 (×3): 2 [IU] via INTRAVENOUS

## 2022-11-19 MED ORDER — ORAL CARE MOUTH RINSE: 15.0000 mL | Freq: Once | OROMUCOSAL | Status: AC

## 2022-11-19 MED ORDER — HEPARIN 6000 UNIT IRRIGATION SOLUTION
Status: AC
  Filled 2022-11-19: qty 500

## 2022-11-19 MED ORDER — MIDAZOLAM HCL 2 MG/2ML IJ SOLN: 0.5000 mg | Freq: Once | INTRAMUSCULAR | Status: DC | PRN

## 2022-11-19 MED ORDER — HEPARIN SODIUM (PORCINE) 5000 UNIT/ML IJ SOLN
5000.0000 [IU] | Freq: Three times a day (TID) | INTRAMUSCULAR | Status: DC
  Administered 2022-11-20 – 2022-11-21 (×4): 5000 [IU] via SUBCUTANEOUS
  Filled 2022-11-19 (×4): qty 1

## 2022-11-19 MED ORDER — ALBUTEROL SULFATE (2.5 MG/3ML) 0.083% IN NEBU: 2.5000 mg | INHALATION_SOLUTION | Freq: Four times a day (QID) | RESPIRATORY_TRACT | Status: DC | PRN

## 2022-11-19 MED ORDER — LACTATED RINGERS IV SOLN: INTRAVENOUS | Status: DC | PRN

## 2022-11-19 MED ORDER — ONDANSETRON HCL 4 MG/2ML IJ SOLN
INTRAMUSCULAR | Status: DC | PRN
  Administered 2022-11-19: 4 mg via INTRAVENOUS

## 2022-11-19 MED ORDER — LACTATED RINGERS IV SOLN: INTRAVENOUS | Status: DC

## 2022-11-19 MED ORDER — HEPARIN SODIUM (PORCINE) 1000 UNIT/ML IJ SOLN
INTRAMUSCULAR | Status: AC
  Filled 2022-11-19: qty 20

## 2022-11-19 MED ORDER — PHENOL 1.4 % MT LIQD: 1.0000 | OROMUCOSAL | Status: DC | PRN

## 2022-11-19 MED ORDER — CHLORHEXIDINE GLUCONATE CLOTH 2 % EX PADS: 6.0000 | MEDICATED_PAD | Freq: Once | CUTANEOUS | Status: DC

## 2022-11-19 MED ORDER — POLYETHYLENE GLYCOL 3350 17 G PO PACK: 17.0000 g | PACK | Freq: Every day | ORAL | Status: DC | PRN

## 2022-11-19 MED ORDER — UMECLIDINIUM BROMIDE 62.5 MCG/ACT IN AEPB
1.0000 | INHALATION_SPRAY | Freq: Every day | RESPIRATORY_TRACT | Status: DC
  Administered 2022-11-20 – 2022-11-21 (×2): 1 via RESPIRATORY_TRACT
  Filled 2022-11-19: qty 7

## 2022-11-19 MED ORDER — FENTANYL CITRATE (PF) 250 MCG/5ML IJ SOLN
INTRAMUSCULAR | Status: AC
  Filled 2022-11-19: qty 5

## 2022-11-19 MED ORDER — ISOSORBIDE MONONITRATE ER 30 MG PO TB24
30.0000 mg | ORAL_TABLET | Freq: Every day | ORAL | Status: DC
  Administered 2022-11-19 – 2022-11-21 (×3): 30 mg via ORAL
  Filled 2022-11-19 (×3): qty 1

## 2022-11-19 MED ORDER — DEXAMETHASONE SODIUM PHOSPHATE 10 MG/ML IJ SOLN
INTRAMUSCULAR | Status: AC
  Filled 2022-11-19: qty 1

## 2022-11-19 MED ORDER — AMLODIPINE BESYLATE 10 MG PO TABS
10.0000 mg | ORAL_TABLET | Freq: Every day | ORAL | Status: DC
  Administered 2022-11-19 – 2022-11-21 (×3): 10 mg via ORAL
  Filled 2022-11-19 (×3): qty 1

## 2022-11-19 MED ORDER — LIDOCAINE 2% (20 MG/ML) 5 ML SYRINGE
INTRAMUSCULAR | Status: DC | PRN
  Administered 2022-11-19: 20 mg via INTRAVENOUS

## 2022-11-19 MED ORDER — ONDANSETRON HCL 4 MG/2ML IJ SOLN: 4.0000 mg | Freq: Four times a day (QID) | INTRAMUSCULAR | Status: DC | PRN

## 2022-11-19 MED ORDER — GUAIFENESIN-DM 100-10 MG/5ML PO SYRP: 15.0000 mL | ORAL_SOLUTION | ORAL | Status: DC | PRN

## 2022-11-19 MED ORDER — PHENYLEPHRINE HCL-NACL 20-0.9 MG/250ML-% IV SOLN
INTRAVENOUS | Status: DC | PRN
  Administered 2022-11-19: 25 ug/min via INTRAVENOUS

## 2022-11-19 MED ORDER — CIPROFLOXACIN IN D5W 400 MG/200ML IV SOLN
400.0000 mg | INTRAVENOUS | Status: AC
  Administered 2022-11-19: 400 mg via INTRAVENOUS
  Filled 2022-11-19: qty 200

## 2022-11-19 MED ORDER — HYDRALAZINE HCL 20 MG/ML IJ SOLN: 5.0000 mg | INTRAMUSCULAR | Status: DC | PRN

## 2022-11-19 MED ORDER — POTASSIUM CHLORIDE CRYS ER 20 MEQ PO TBCR: 20.0000 meq | EXTENDED_RELEASE_TABLET | Freq: Every day | ORAL | Status: DC | PRN

## 2022-11-19 MED ORDER — OXYCODONE HCL 5 MG PO TABS: 5.0000 mg | ORAL_TABLET | Freq: Once | ORAL | Status: DC | PRN

## 2022-11-19 MED ORDER — PROMETHAZINE HCL 25 MG/ML IJ SOLN: 6.2500 mg | INTRAMUSCULAR | Status: DC | PRN

## 2022-11-19 MED ORDER — INSULIN ASPART 100 UNIT/ML IJ SOLN
0.0000 [IU] | INTRAMUSCULAR | Status: AC | PRN
  Administered 2022-11-19 (×2): 2 [IU] via SUBCUTANEOUS

## 2022-11-19 MED ORDER — ADULT MULTIVITAMIN W/MINERALS CH
1.0000 | ORAL_TABLET | Freq: Every morning | ORAL | Status: DC
  Administered 2022-11-20 – 2022-11-21 (×2): 1 via ORAL
  Filled 2022-11-19 (×2): qty 1

## 2022-11-19 MED ORDER — BISACODYL 10 MG RE SUPP: 10.0000 mg | Freq: Every day | RECTAL | Status: DC | PRN

## 2022-11-19 MED ORDER — OXYCODONE HCL 5 MG/5ML PO SOLN: 5.0000 mg | Freq: Once | ORAL | Status: DC | PRN

## 2022-11-19 MED ORDER — ACETAMINOPHEN 500 MG PO TABS
1000.0000 mg | ORAL_TABLET | Freq: Once | ORAL | Status: AC
  Administered 2022-11-19: 1000 mg via ORAL
  Filled 2022-11-19: qty 2

## 2022-11-19 MED ORDER — CEFAZOLIN SODIUM-DEXTROSE 2-4 GM/100ML-% IV SOLN
2.0000 g | Freq: Three times a day (TID) | INTRAVENOUS | Status: AC
  Administered 2022-11-19 (×2): 2 g via INTRAVENOUS
  Filled 2022-11-19 (×2): qty 100

## 2022-11-19 MED ORDER — OXYCODONE HCL 5 MG PO TABS: 5.0000 mg | ORAL_TABLET | ORAL | Status: DC | PRN

## 2022-11-19 MED ORDER — GLIPIZIDE 5 MG PO TABS
5.0000 mg | ORAL_TABLET | Freq: Two times a day (BID) | ORAL | Status: DC
  Administered 2022-11-19 – 2022-11-21 (×4): 5 mg via ORAL
  Filled 2022-11-19 (×5): qty 1

## 2022-11-19 MED ORDER — IRBESARTAN 150 MG PO TABS
75.0000 mg | ORAL_TABLET | Freq: Every day | ORAL | Status: DC
  Administered 2022-11-19 – 2022-11-21 (×3): 75 mg via ORAL
  Filled 2022-11-19 (×3): qty 1

## 2022-11-19 MED ORDER — DEXAMETHASONE SODIUM PHOSPHATE 10 MG/ML IJ SOLN
INTRAMUSCULAR | Status: DC | PRN
  Administered 2022-11-19: 5 mg via INTRAVENOUS

## 2022-11-19 MED ORDER — IODIXANOL 320 MG/ML IV SOLN
INTRAVENOUS | Status: DC | PRN
  Administered 2022-11-19: 25 mL via INTRA_ARTERIAL

## 2022-11-19 MED ORDER — ROCURONIUM BROMIDE 10 MG/ML (PF) SYRINGE
PREFILLED_SYRINGE | INTRAVENOUS | Status: AC
  Filled 2022-11-19: qty 20

## 2022-11-19 MED ORDER — ACETAMINOPHEN 325 MG PO TABS
325.0000 mg | ORAL_TABLET | ORAL | Status: DC | PRN
  Administered 2022-11-19 – 2022-11-20 (×4): 650 mg via ORAL
  Filled 2022-11-19 (×4): qty 2

## 2022-11-19 MED ORDER — CARVEDILOL 25 MG PO TABS
25.0000 mg | ORAL_TABLET | Freq: Two times a day (BID) | ORAL | Status: DC
  Administered 2022-11-19 – 2022-11-21 (×4): 25 mg via ORAL
  Filled 2022-11-19 (×4): qty 1

## 2022-11-19 MED ORDER — CHLORHEXIDINE GLUCONATE 0.12 % MT SOLN
15.0000 mL | Freq: Once | OROMUCOSAL | Status: AC
  Administered 2022-11-19: 15 mL via OROMUCOSAL
  Filled 2022-11-19: qty 15

## 2022-11-19 MED ORDER — HYDROMORPHONE HCL 1 MG/ML IJ SOLN: 0.2500 mg | INTRAMUSCULAR | Status: DC | PRN

## 2022-11-19 MED ORDER — MAGNESIUM SULFATE 2 GM/50ML IV SOLN: 2.0000 g | Freq: Every day | INTRAVENOUS | Status: DC | PRN

## 2022-11-19 MED ORDER — ONDANSETRON HCL 4 MG/2ML IJ SOLN
INTRAMUSCULAR | Status: AC
  Filled 2022-11-19: qty 2

## 2022-11-19 MED ORDER — METFORMIN HCL 500 MG PO TABS
500.0000 mg | ORAL_TABLET | Freq: Two times a day (BID) | ORAL | Status: DC
  Administered 2022-11-21: 500 mg via ORAL
  Filled 2022-11-19: qty 1

## 2022-11-19 MED ORDER — EPHEDRINE 5 MG/ML INJ
INTRAVENOUS | Status: AC
  Filled 2022-11-19: qty 5

## 2022-11-19 MED ORDER — METOPROLOL TARTRATE 5 MG/5ML IV SOLN: 2.0000 mg | INTRAVENOUS | Status: DC | PRN

## 2022-11-19 MED ORDER — LABETALOL HCL 5 MG/ML IV SOLN: 10.0000 mg | INTRAVENOUS | Status: DC | PRN

## 2022-11-19 MED ORDER — SIMVASTATIN 20 MG PO TABS
20.0000 mg | ORAL_TABLET | Freq: Every day | ORAL | Status: DC
  Administered 2022-11-19 – 2022-11-20 (×2): 20 mg via ORAL
  Filled 2022-11-19 (×2): qty 1

## 2022-11-19 MED ORDER — ASPIRIN 81 MG PO TBEC
81.0000 mg | DELAYED_RELEASE_TABLET | Freq: Every day | ORAL | Status: DC
  Administered 2022-11-20 – 2022-11-21 (×2): 81 mg via ORAL
  Filled 2022-11-19 (×2): qty 1

## 2022-11-19 MED ORDER — ALUM & MAG HYDROXIDE-SIMETH 200-200-20 MG/5ML PO SUSP: 15.0000 mL | ORAL | Status: DC | PRN

## 2022-11-19 MED ORDER — PRASUGREL HCL 10 MG PO TABS
10.0000 mg | ORAL_TABLET | Freq: Every day | ORAL | Status: DC
  Administered 2022-11-20 – 2022-11-21 (×2): 10 mg via ORAL
  Filled 2022-11-19 (×2): qty 1

## 2022-11-19 MED ORDER — DOXYCYCLINE HYCLATE 100 MG PO TABS
100.0000 mg | ORAL_TABLET | Freq: Two times a day (BID) | ORAL | Status: DC
  Administered 2022-11-19 – 2022-11-21 (×4): 100 mg via ORAL
  Filled 2022-11-19 (×4): qty 1

## 2022-11-19 MED ORDER — ROCURONIUM BROMIDE 10 MG/ML (PF) SYRINGE
PREFILLED_SYRINGE | INTRAVENOUS | Status: DC | PRN
  Administered 2022-11-19: 40 mg via INTRAVENOUS
  Administered 2022-11-19: 60 mg via INTRAVENOUS
  Administered 2022-11-19: 20 mg via INTRAVENOUS

## 2022-11-19 MED ORDER — PROTAMINE SULFATE 10 MG/ML IV SOLN
INTRAVENOUS | Status: DC | PRN
  Administered 2022-11-19: 25 mg via INTRAVENOUS

## 2022-11-19 MED ORDER — PHENYLEPHRINE 80 MCG/ML (10ML) SYRINGE FOR IV PUSH (FOR BLOOD PRESSURE SUPPORT)
PREFILLED_SYRINGE | INTRAVENOUS | Status: AC
  Filled 2022-11-19: qty 10

## 2022-11-19 MED ORDER — LORATADINE 10 MG PO TABS
10.0000 mg | ORAL_TABLET | Freq: Every day | ORAL | Status: DC
  Administered 2022-11-20 – 2022-11-21 (×2): 10 mg via ORAL
  Filled 2022-11-19 (×2): qty 1

## 2022-11-19 MED ORDER — LIDOCAINE 2% (20 MG/ML) 5 ML SYRINGE
INTRAMUSCULAR | Status: AC
  Filled 2022-11-19: qty 5

## 2022-11-19 SURGICAL SUPPLY — 75 items
ADH SKN CLS APL DERMABOND .7 (GAUZE/BANDAGES/DRESSINGS) ×1
APL PRP STRL LF DISP 70% ISPRP (MISCELLANEOUS)
BAG COUNTER SPONGE SURGICOUNT (BAG) ×1 IMPLANT
BAG SNAP BAND KOVER 36X36 (MISCELLANEOUS) IMPLANT
BAG SPNG CNTER NS LX DISP (BAG)
BANDAGE ESMARK 6X9 LF (GAUZE/BANDAGES/DRESSINGS) IMPLANT
BNDG CMPR 9X6 STRL LF SNTH (GAUZE/BANDAGES/DRESSINGS)
BNDG ESMARK 6X9 LF (GAUZE/BANDAGES/DRESSINGS)
CANISTER SUCT 3000ML PPV (MISCELLANEOUS) ×1 IMPLANT
CANNULA VESSEL 3MM 2 BLNT TIP (CANNULA) IMPLANT
CATH EMB 4FR 80 (CATHETERS) IMPLANT
CATH OMNI FLUSH 5F 65CM (CATHETERS) ×1 IMPLANT
CHLORAPREP W/TINT 26 (MISCELLANEOUS) ×1 IMPLANT
CLIP LIGATING EXTRA MED SLVR (CLIP) ×1 IMPLANT
CLIP LIGATING EXTRA SM BLUE (MISCELLANEOUS) ×1 IMPLANT
COVER BACK TABLE 80X110 HD (DRAPES) ×2 IMPLANT
COVER DOME SNAP 22 D (MISCELLANEOUS) IMPLANT
COVER PROBE W GEL 5X96 (DRAPES) ×1 IMPLANT
DERMABOND ADVANCED .7 DNX12 (GAUZE/BANDAGES/DRESSINGS) ×1 IMPLANT
DEVICE TORQUE KENDALL .025-038 (MISCELLANEOUS) IMPLANT
DRAIN CHANNEL 15F RND FF W/TCR (WOUND CARE) IMPLANT
DRAPE C-ARM 42X72 X-RAY (DRAPES) IMPLANT
DRAPE HALF SHEET 40X57 (DRAPES) IMPLANT
DRAPE X-RAY CASS 24X20 (DRAPES) IMPLANT
ELECT REM PT RETURN 9FT ADLT (ELECTROSURGICAL) ×1
ELECTRODE REM PT RTRN 9FT ADLT (ELECTROSURGICAL) ×1 IMPLANT
EVACUATOR SILICONE 100CC (DRAIN) IMPLANT
FILTER CO2 0.2 MICRON (VASCULAR PRODUCTS) IMPLANT
FILTER CO2 INSUFFLATOR AX1008 (MISCELLANEOUS) IMPLANT
GLOVE BIO SURGEON STRL SZ7.5 (GLOVE) ×1 IMPLANT
GOWN STRL REUS W/ TWL LRG LVL3 (GOWN DISPOSABLE) ×2 IMPLANT
GOWN STRL REUS W/ TWL XL LVL3 (GOWN DISPOSABLE) ×1 IMPLANT
GOWN STRL REUS W/TWL LRG LVL3 (GOWN DISPOSABLE) ×2
GOWN STRL REUS W/TWL XL LVL3 (GOWN DISPOSABLE) ×3
GUIDEWIRE ANGLED .035X150CM (WIRE) IMPLANT
HEMOSTAT SNOW SURGICEL 2X4 (HEMOSTASIS) IMPLANT
INSERT FOGARTY SM (MISCELLANEOUS) IMPLANT
KIT BASIN OR (CUSTOM PROCEDURE TRAY) ×1 IMPLANT
KIT TURNOVER KIT B (KITS) ×1 IMPLANT
MARKER SKIN DUAL TIP RULER LAB (MISCELLANEOUS) IMPLANT
NDL PERC 18GX7CM (NEEDLE) ×1 IMPLANT
NEEDLE PERC 18GX7CM (NEEDLE) ×1 IMPLANT
NS IRRIG 1000ML POUR BTL (IV SOLUTION) ×2 IMPLANT
PACK ENDO MINOR (CUSTOM PROCEDURE TRAY) ×1 IMPLANT
PACK PERIPHERAL VASCULAR (CUSTOM PROCEDURE TRAY) ×1 IMPLANT
PAD ARMBOARD 7.5X6 YLW CONV (MISCELLANEOUS) ×2 IMPLANT
POWDER SURGICEL 3.0 GRAM (HEMOSTASIS) IMPLANT
SET FLUSH CO2 (MISCELLANEOUS) IMPLANT
SET MICROPUNCTURE 5F STIFF (MISCELLANEOUS) IMPLANT
STOCKINETTE IMPERVIOUS LG (DRAPES) IMPLANT
STOPCOCK 4 WAY LG BORE MALE ST (IV SETS) IMPLANT
STOPCOCK MORSE 400PSI 3WAY (MISCELLANEOUS) ×1 IMPLANT
SUT ETHILON 3 0 PS 1 (SUTURE) IMPLANT
SUT MNCRL AB 4-0 PS2 18 (SUTURE) ×2 IMPLANT
SUT PROLENE 5 0 C 1 24 (SUTURE) ×1 IMPLANT
SUT PROLENE 6 0 BV (SUTURE) ×1 IMPLANT
SUT SILK 2 0 SH (SUTURE) ×1 IMPLANT
SUT SILK 3 0 (SUTURE)
SUT SILK 3-0 18XBRD TIE 12 (SUTURE) IMPLANT
SUT VIC AB 2-0 CT1 27 (SUTURE) ×1
SUT VIC AB 2-0 CT1 TAPERPNT 27 (SUTURE) ×2 IMPLANT
SUT VIC AB 3-0 SH 27 (SUTURE) ×1
SUT VIC AB 3-0 SH 27X BRD (SUTURE) ×2 IMPLANT
SYR 10ML LL (SYRINGE) IMPLANT
SYR 3ML LL SCALE MARK (SYRINGE) IMPLANT
SYR MEDRAD MARK V 150ML (SYRINGE) IMPLANT
TOWEL GREEN STERILE (TOWEL DISPOSABLE) ×2 IMPLANT
TOWEL GREEN STERILE FF (TOWEL DISPOSABLE) ×1 IMPLANT
TRAY FOLEY MTR SLVR 16FR STAT (SET/KITS/TRAYS/PACK) ×1 IMPLANT
TUBING CIL FLEX 10 FLL-RA (TUBING) IMPLANT
TUBING HIGH PRESSURE 120CM (CONNECTOR) IMPLANT
TUBING INJECTOR 48 (MISCELLANEOUS) IMPLANT
UNDERPAD 30X36 HEAVY ABSORB (UNDERPADS AND DIAPERS) ×1 IMPLANT
WATER STERILE IRR 1000ML POUR (IV SOLUTION) ×1 IMPLANT
WIRE BENTSON .035X145CM (WIRE) ×1 IMPLANT

## 2022-11-19 NOTE — Progress Notes (Signed)
Spoke to [employer] with [employer] to make him aware of the time change for the patient's surgery.  Kerry Fort stated the magnet would be fine to use for this case.

## 2022-11-19 NOTE — Anesthesia Procedure Notes (Signed)
Procedure Name: Intubation Date/Time: 11/19/2022 8:44 AM  Performed by: Waynard Edwards, CRNAPre-anesthesia Checklist: Patient identified, Emergency Drugs available, Suction available and Patient being monitored Patient Re-evaluated:Patient Re-evaluated prior to induction Oxygen Delivery Method: Circle system utilized Preoxygenation: Pre-oxygenation with 100% oxygen Induction Type: IV induction Ventilation: Mask ventilation without difficulty Laryngoscope Size: Miller and 3 Grade View: Grade I Tube type: Oral Tube size: 7.5 mm Number of attempts: 1 Airway Equipment and Method: Stylet Placement Confirmation: ETT inserted through vocal cords under direct vision, positive ETCO2 and breath sounds checked- equal and bilateral Secured at: 23 cm Tube secured with: Tape Dental Injury: Teeth and Oropharynx as per pre-operative assessment

## 2022-11-19 NOTE — Anesthesia Postprocedure Evaluation (Signed)
Anesthesia Post Note  Patient: Luke [name]  Procedure(s) Performed: RIGHT COMMON FEMORAL ENDARTERECTOMY WITH VEIN PATCH ANGIOPLASTY (Right) VEIN PATCH ANGIOPLASTY (Right) RIGHT LOWER EXTREMITY ANGIOGRAM (Right)     Patient location during evaluation: PACU Anesthesia Type: General Level of consciousness: awake and alert, patient cooperative and oriented Pain management: pain level controlled Vital Signs Assessment: post-procedure vital signs reviewed and stable Respiratory status: spontaneous breathing, nonlabored ventilation, respiratory function stable and patient connected to nasal cannula oxygen Cardiovascular status: blood pressure returned to baseline and stable Postop Assessment: no apparent nausea or vomiting Anesthetic complications: no   No notable events documented.  Last Vitals:  Vitals:   11/19/22 1145 11/19/22 1200  BP: (!) 126/56 109/64  Pulse: 72 68  Resp: 13 11  Temp:  36.4 C  SpO2: 95% 98%    Last Pain:  Vitals:   11/19/22 1200  TempSrc:   PainSc: 0-No pain                 Mohan Erven,E. Evalena Fujii

## 2022-11-19 NOTE — Interval H&P Note (Signed)
History and Physical Interval Note:  11/19/2022 8:22 AM  Luke [name]  has presented today for surgery, with the diagnosis of Atherosclerosis of native artery of right lower extremity with gangrene.  The various methods of treatment have been discussed with the patient and family. After consideration of risks, benefits and other options for treatment, the patient has consented to  Procedure(s): RIGHT COMMON ENDARTERECTOMY FEMORAL (Right) RIGHT LOWER EXTERMITY AORTOGRAM WITH POSSIBLE STENTING (Right) POSSIBLE ABOVE KNEE BYPASS (Right) as a surgical intervention.  The patient's history has been reviewed, patient examined, no change in status, stable for surgery.  I have reviewed the patient's chart and labs.  Questions were answered to the patient's satisfaction.     Luke [name]

## 2022-11-19 NOTE — Op Note (Signed)
Patient name: Luke [name] MRN: [id number] DOB: [birth year] Sex: male  11/19/2022 Pre-operative Diagnosis: Chronic right lower extremity limb threatening ischemia with tissue loss Post-operative diagnosis:  Same Surgeon:  Luanna Salk. Randie Heinz, MD Assistant: Gerarda Fraction, MD Procedure Performed: 1.  Extensive right common femoral endarterectomy including external iliac, profunda femoris and superficial femoral endarterectomy with vein patch angioplasty 2.  Harvest of right greater saphenous vein 3.  Right lower extremity angiography  Indications: 77 year old male initially had small ulceration at the tip of one of his toes and was set up for bypass surgery from femoral to below-knee popliteal but without adequate vein.  He ultimately underwent angiography and developed severe diffuse rash with sloughing that has mostly resolved except for the right lower extremity below the knee.  Patient now requires revascularization of the right lower extremity to include endarterectomy but he is not a candidate for bypass below the knee given the unhealthy nature of the skin.  We are planning for endarterectomy of the common femoral artery with antegrade angiogram of the right lower extremity with possible intervention.  An experienced assistant was necessary to facilitate exposure of the common femoral artery as well as perform extensive endarterectomy and assist with harvest of the saphenous vein and vein patch angioplasty.  Findings: Common femoral artery was subtotally occluded and the profunda was heavily calcified and diseased.  There was posterior plaque extending high into the external iliac artery and subtotally occlusive lesions in the proximal SFA which were all endarterectomized.  At completion there was pulsatile backbleeding from the profunda and very strong inflow where there was no further disease in the external iliac artery.  Angiography demonstrated heavily diseased SFA beginning after the  endarterectomy site with multiple areas of 50% stenosis up to the level of the abductor where it frankly occluded then reconstitutes an above-the-knee popliteal artery as an [place] and this occludes at the level of the knee and reconstitutes the very distal tibial trifurcation with dominant runoff via the posterior tibial artery.  After endarterectomy there is very strong signal in the profunda and the SFA proximally and at the posterior tibial at the ankle at completion.  We elected no endovascular intervention which would require stenting from the tibial trifurcation all the way to the takeoff of the SFA.  If patient is unable to heal his current wounds we may have to consider this both from an antegrade and retrograde possible brachial approach but if he is able to heal most of his skin we could consider bypass to the below-knee popliteal artery and tibial trifurcation if necessary which would be performed with graft given that vein was diminutive for bypass and was harvested today for patch angioplasty.   Procedure:  The patient was identified in the holding area and taken to the operating room where he was placed by operative when general anesthesia was induced.  He was sterilely prepped and draped in the entire right lower extremity in the usual fashion, antibiotics were administered a timeout was called.  The right lower extremity was excluded below the knee given the extensive skin breakdown.  We then performed vertical incision in the groin dissected down to the common femoral artery.  Under the inguinal ligament we divided the crossing vein isolated multiple side branches placed a vessel loop around the external iliac artery high under the inguinal ligament.  We dissected down to the common femoral bifurcation isolated the profunda as well as the distal SFA.  Through the same incision we  then isolated the greater saphenous vein divided branches between clips and ties.  There was early branching in the  saphenous vein and these were clipped and transected and the saphenofemoral junction was oversewn with 5-0 Prolene suture and the vein was fully harvested.  Patient was then fully heparinized.  We clamped the SFA followed by profunda and the external iliac artery.  We opened the vessel longitudinally performed extensive endarterectomy including down into the SFA and at the profunda we establish very strong pulsatile backbleeding.  I placed a 4 Fogarty into the distal external iliac artery and performed extensive endarterectomy until there was very strong inflow and this was reclamped.  The vein was then spatulated and sewn as a patch angioplasty in place with a 5-0 Prolene suture.  Prior to completion without flushing all directions.  Upon completion there was a very strong pulse within the common femoral artery.  After breaking stroke and placing lead we then cannulated the common femoral artery with a micropuncture needle in an antegrade direction followed by wire and a sheath.  Right lower extremity angiography was performed and no intervention was undertaken.  The micropuncture sheath was then removed and the site was sutured ligated with 6-0 Prolene suture.  25 mg of protamine was then administered we obtained hemostasis in the wound and thoroughly irrigated.  It was then closed in layers with 2-0 and 3-0 Vicryl followed by 4-0 Monocryl at the skin level and Dermabond was placed above this.  Patient was then awakened from anesthesia having tolerated procedure without any complication.  There was a very strong posterior tibial signal at the completion.  All counts were correct at completion.   EBL: 200cc  Contrast: 25cc  Shayda Kalka C. Randie Heinz, MD [facility] Office: [phone] Pager: [phone]

## 2022-11-19 NOTE — Transfer of Care (Signed)
Immediate Anesthesia Transfer of Care Note  Patient: Luke [name]  Procedure(s) Performed: RIGHT COMMON FEMORAL ENDARTERECTOMY WITH VEIN PATCH ANGIOPLASTY (Right) VEIN PATCH ANGIOPLASTY (Right) RIGHT LOWER EXTREMITY ANGIOGRAM (Right)  Patient Location: PACU  Anesthesia Type:General  Level of Consciousness: awake, alert , oriented, and patient cooperative  Airway & Oxygen Therapy: Patient Spontanous Breathing and Patient connected to nasal cannula oxygen  Post-op Assessment: Report given to RN, Post -op Vital signs reviewed and stable, and Patient moving all extremities X 4  Post vital signs: Reviewed and stable  Last Vitals:  Vitals Value Taken Time  BP 114/65 11/19/22 1140  Temp    Pulse 71 11/19/22 1142  Resp 13 11/19/22 1142  SpO2 94 % 11/19/22 1142  Vitals shown include unfiled device data.  Last Pain:  Vitals:   11/19/22 0742  TempSrc:   PainSc: 2       Patients Stated Pain Goal: 0 (11/19/22 0742)  Complications: No notable events documented.

## 2022-11-19 NOTE — Progress Notes (Signed)
No ICM remote transmission received for 11/18/2022 (currently hospitalized for procedure) and next ICM transmission scheduled for 11/25/2022.

## 2022-11-19 NOTE — Discharge Instructions (Signed)
 [facility]  Discharge instructions  Lower Extremity Bypass Surgery  Please refer to the following instruction for your post-procedure care. Your surgeon or physician assistant will discuss any changes with you.  Activity  You are encouraged to walk as much as you can. You can slowly return to normal activities during the month after your surgery. Avoid strenuous activity and heavy lifting until your doctor tells you it's OK. Avoid activities such as vacuuming or swinging a golf club. Do not drive until your doctor give the OK and you are no longer taking prescription pain medications. It is also normal to have difficulty with sleep habits, eating and bowel movement after surgery. These will go away with time.  Bathing/Showering  Shower daily after you go home. Do not soak in a bathtub, hot tub, or swim until the incision heals completely.  Incision Care  Clean your incision with mild soap and water. Shower every day. Pat the area dry with a clean towel. You do not need a bandage unless otherwise instructed. Do not apply any ointments or creams to your incision. If you have open wounds you will be instructed how to care for them or a visiting nurse may be arranged for you. If you have staples or sutures along your incision they will be removed at your post-op appointment. You may have skin glue on your incision. Do not peel it off. It will come off on its own in about one week.  Wash the groin wound with soap and water daily and pat dry. (No tub bath-only shower)  Then put a dry gauze or washcloth in the groin to keep this area dry to help prevent wound infection.  Do this daily and as needed.  Do not use Vaseline or neosporin on your incisions.  Only use soap and water on your incisions and then protect and keep dry.  Diet  Resume your normal diet. There are no special food restrictions following this procedure. A low fat/ low cholesterol diet is  recommended for all patients with vascular disease. In order to heal from your surgery, it is CRITICAL to get adequate nutrition. Your body requires vitamins, minerals, and protein. Vegetables are the best source of vitamins and minerals. Vegetables also provide the perfect balance of protein. Processed food has little nutritional value, so try to avoid this.  Medications  Resume taking all your medications unless your doctor or physician assistant tells you not to. If your incision is causing pain, you may take over-the-counter pain relievers such as acetaminophen (Tylenol). If you were prescribed a stronger pain medication, please aware these medication can cause nausea and constipation. Prevent nausea by taking the medication with a snack or meal. Avoid constipation by drinking plenty of fluids and eating foods with high amount of fiber, such as fruits, vegetables, and grains. Take Colace 100 mg (an over-the-counter stool softener) twice a day as needed for constipation.  Do not take Tylenol if you are taking prescription pain medications.  Follow Up  Our office will schedule a follow up appointment 2-3 weeks following discharge.  Please call us immediately for any of the following conditions  Severe or worsening pain in your legs or feet while at rest or while walking Increase pain, redness, warmth, or drainage (pus) from your incision site(s) Fever of 101 degree or higher The swelling in your leg with the bypass suddenly worsens and becomes more painful than when you were in the hospital If you have   been instructed to feel your graft pulse then you should do so every day. If you can no longer feel this pulse, call the office immediately. Not all patients are given this instruction.  Leg swelling is common after leg bypass surgery.  The swelling should improve over a few months following surgery. To improve the swelling, you may elevate your legs above the level of your heart while you are  sitting or resting. Your surgeon or physician assistant may ask you to apply an ACE wrap or wear compression (TED) stockings to help to reduce swelling.  Reduce your risk of vascular disease  Stop smoking. If you would like help call [facility] at 1-800-QUIT-NOW (1-800-784-8669) or [facility] at 336-586-4000.  Manage your cholesterol Maintain a desired weight Control your diabetes weight Control your diabetes Keep your blood pressure down  If you have any questions, please call the office at [phone]  

## 2022-11-19 NOTE — Anesthesia Procedure Notes (Signed)
Arterial Line Insertion Start/End7/16/2024 8:10 AM, 11/19/2022 8:20 AM Performed by: Waynard Edwards, CRNA, CRNA  Patient location: Pre-op. Preanesthetic checklist: patient identified, IV checked, site marked, risks and benefits discussed, surgical consent, monitors and equipment checked, pre-op evaluation, timeout performed and anesthesia consent Lidocaine 1% used for infiltration Left, radial was placed Catheter size: 20 G Hand hygiene performed , maximum sterile barriers used  and Seldinger technique used Allen's test indicative of satisfactory collateral circulation Attempts: 1 Procedure performed without using ultrasound guided technique. Following insertion, dressing applied and Biopatch. Post procedure assessment: normal and unchanged  Patient tolerated the procedure well with no immediate complications.

## 2022-11-20 ENCOUNTER — Encounter (HOSPITAL_COMMUNITY): Payer: Self-pay | Admitting: Vascular Surgery

## 2022-11-20 ENCOUNTER — Encounter: Payer: Medicare Other | Admitting: Pharmacist

## 2022-11-20 LAB — CBC
HCT: 32.2 % — ABNORMAL LOW (ref 39.0–52.0)
Hemoglobin: 10.7 g/dL — ABNORMAL LOW (ref 13.0–17.0)
MCH: 31.8 pg (ref 26.0–34.0)
MCHC: 33.2 g/dL (ref 30.0–36.0)
MCV: 95.8 fL (ref 80.0–100.0)
Platelets: 282 10*3/uL (ref 150–400)
RBC: 3.36 MIL/uL — ABNORMAL LOW (ref 4.22–5.81)
RDW: 13.4 % (ref 11.5–15.5)
WBC: 11.4 10*3/uL — ABNORMAL HIGH (ref 4.0–10.5)
nRBC: 0 % (ref 0.0–0.2)

## 2022-11-20 LAB — LIPID PANEL
Cholesterol: 110 mg/dL (ref 0–200)
HDL: 31 mg/dL — ABNORMAL LOW (ref >40)
LDL Cholesterol: 45 mg/dL (ref 0–99)
Total CHOL/HDL Ratio: 3.5 RATIO
Triglycerides: 170 mg/dL — ABNORMAL HIGH (ref <150)
VLDL: 34 mg/dL (ref 0–40)

## 2022-11-20 LAB — BASIC METABOLIC PANEL
Anion gap: 15 (ref 5–15)
BUN: 23 mg/dL (ref 8–23)
CO2: 23 mmol/L (ref 22–32)
Calcium: 8.9 mg/dL (ref 8.9–10.3)
Chloride: 97 mmol/L — ABNORMAL LOW (ref 98–111)
Creatinine, Ser: 1.17 mg/dL (ref 0.61–1.24)
GFR, Estimated: >60 mL/min (ref >60)
Glucose, Bld: 370 mg/dL — ABNORMAL HIGH (ref 70–99)
Potassium: 3.9 mmol/L (ref 3.5–5.1)
Sodium: 135 mmol/L (ref 135–145)

## 2022-11-20 LAB — GLUCOSE, CAPILLARY
Glucose-Capillary: 243 mg/dL — ABNORMAL HIGH (ref 70–99)
Glucose-Capillary: 223 mg/dL — ABNORMAL HIGH (ref 70–99)
Glucose-Capillary: 151 mg/dL — ABNORMAL HIGH (ref 70–99)
Glucose-Capillary: 239 mg/dL — ABNORMAL HIGH (ref 70–99)

## 2022-11-20 MED ORDER — INSULIN ASPART 100 UNIT/ML IJ SOLN
0.0000 [IU] | Freq: Every day | INTRAMUSCULAR | Status: DC
  Administered 2022-11-20: 2 [IU] via SUBCUTANEOUS

## 2022-11-20 MED ORDER — INSULIN GLARGINE-YFGN 100 UNIT/ML ~~LOC~~ SOLN
10.0000 [IU] | SUBCUTANEOUS | Status: DC
  Administered 2022-11-20 – 2022-11-21 (×2): 10 [IU] via SUBCUTANEOUS
  Filled 2022-11-20 (×2): qty 0.1

## 2022-11-20 MED ORDER — SILVER SULFADIAZINE 1 % EX CREA
TOPICAL_CREAM | Freq: Every day | CUTANEOUS | Status: DC
  Filled 2022-11-20: qty 85

## 2022-11-20 MED ORDER — INSULIN ASPART 100 UNIT/ML IJ SOLN
0.0000 [IU] | Freq: Three times a day (TID) | INTRAMUSCULAR | Status: DC
  Administered 2022-11-20: 2 [IU] via SUBCUTANEOUS
  Administered 2022-11-20: 3 [IU] via SUBCUTANEOUS
  Administered 2022-11-21: 2 [IU] via SUBCUTANEOUS
  Administered 2022-11-21: 7 [IU] via SUBCUTANEOUS

## 2022-11-20 NOTE — Inpatient Diabetes Management (Signed)
Inpatient Diabetes Program Recommendations  AACE/ADA: New Consensus Statement on Inpatient Glycemic Control  Target Ranges:  Prepandial:   less than 140 mg/dL      Peak postprandial:   less than 180 mg/dL (1-2 hours)      Critically ill patients:  140 - 180 mg/dL    Latest Reference Range & Units 11/19/22 07:09 11/19/22 09:16 11/19/22 11:02 11/19/22 11:42 11/19/22 13:40 11/19/22 16:27 11/19/22 21:32 11/19/22 23:43 11/20/22 06:03  Glucose-Capillary 70 - 99 mg/dL 161 (H) 096 (H) 045 (H) 174 (H) 147 (H) 176 (H) 400 (H) 357 (H) 243 (H)   Review of Glycemic Control  Diabetes history: DM2 Outpatient Diabetes medications: Lantus 30-35 units daily, Glipizide 5 mg BID, Metformin 1000 mg BID Current orders for Inpatient glycemic control: Glipizide 5 mg BID, Metformin 500 mg BID, Novolog 0-9 units TID with meals  Inpatient Diabetes Program Recommendations:    Insulin: Please consider ordering Semglee 10 units Q24H (to start now) and Novolog 0-5 units at bedtime for bedtime correction.  Thanks, Orlando Penner, RN, MSN, CDCES Diabetes Coordinator Inpatient Diabetes Program [phone] (Team Pager from 8am to 5pm)

## 2022-11-20 NOTE — Evaluation (Addendum)
Occupational Therapy Evaluation Patient Details Name: Luke [name] MRN: [id number] DOB: Nov 21, 1945 Today's Date: 11/20/2022   History of Present Illness 77 y.o. male s/p extensive right common femoral endarterectomy including external iliac, profunda femoris and superficial femoral endarterectomy with vein patch angioplasty and RLE angiography   Clinical Impression   Pt presents with decline in function and safety with ADLs and ADL mobility with impaired balance and endurance. PTA pt lived at home with his wife and was Ind with ADLs/selfcare, IADLs, mobility and drives. Pt currently sits EOB Ind, stands to RW min guard A - Sup. Used RW to walk to bathroom with Sup, LB ADLs and toileting tasks Sup. Pt with R LE beginning to bleed as he walked toward bathroom, RN notified. Pt would benefit from acute OT services to maximize level of function and safety. VSs stable on 2L O2 : 90-91% O2, 82 HR      Recommendations for follow up therapy are one component of a multi-disciplinary discharge planning process, led by the attending physician.  Recommendations may be updated based on patient status, additional functional criteria and insurance authorization.   Assistance Recommended at Discharge    Patient can return home with the following Assist for transportation;A little help with bathing/dressing/bathroom    Functional Status Assessment  Patient has had a recent decline in their functional status and demonstrates the ability to make significant improvements in function in a reasonable and predictable amount of time.  Equipment Recommendations  None recommended by OT    Recommendations for Other Services       Precautions / Restrictions Precautions Precautions: Fall Precaution Comments: O2 sats, R LE bleeding (nursing will be wrapping) Restrictions Weight Bearing Restrictions: No      Mobility Bed Mobility Overal bed mobility: Independent                  Transfers Overall  transfer level: Needs assistance Equipment used: Rolling walker (2 wheels) Transfers: Sit to/from Stand, Bed to chair/wheelchair/BSC Sit to Stand: Min guard, Supervision           General transfer comment: min guard A initially standing from EOB, progressing to Sup using RW for functional mobility in room      Balance Overall balance assessment: Mild deficits observed, not formally tested                                         ADL either performed or assessed with clinical judgement   ADL Overall ADL's : Needs assistance/impaired Eating/Feeding: Independent   Grooming: Wash/dry hands;Wash/dry face;Standing   Upper Body Bathing: Modified independent   Lower Body Bathing: Supervison/ safety   Upper Body Dressing : Modified independent   Lower Body Dressing: Supervision/safety   Toilet Transfer: Supervision/safety;Ambulation;Rolling walker (2 wheels)   Toileting- Clothing Manipulation and Hygiene: Supervision/safety;Sit to/from stand       Functional mobility during ADLs: Min guard;Supervision/safety       Vision Baseline Vision/History: 1 Wears glasses Ability to See in Adequate Light: 0 Adequate Patient Visual Report: No change from baseline       Perception     Praxis      Pertinent Vitals/Pain Pain Assessment Pain Assessment: 0-10 Pain Score: 5  Faces Pain Scale: Hurts little more Pain Location: R foot Pain Descriptors / Indicators: Tender, Burning Pain Intervention(s): Monitored during session, Repositioned     Hand Dominance Right  Extremity/Trunk Assessment Upper Extremity Assessment Upper Extremity Assessment: Overall WFL for tasks assessed   Lower Extremity Assessment Lower Extremity Assessment: Defer to PT evaluation   Cervical / Trunk Assessment Cervical / Trunk Assessment: Normal   Communication Communication Communication: No difficulties   Cognition Arousal/Alertness: Awake/alert Behavior During Therapy: WFL  for tasks assessed/performed Overall Cognitive Status: Within Functional Limits for tasks assessed                                       General Comments       Exercises     Shoulder Instructions      Home Living Family/patient expects to be discharged to:: Private residence Living Arrangements: Spouse/significant other Available Help at Discharge: Family;Available 24 hours/day Type of Home: House Home Access: Stairs to enter [employer] of Steps: 3 Entrance Stairs-Rails: Right;Left       Bathroom Shower/Tub: Chief Strategy Officer: Handicapped height     Home Equipment: Grab bars - tub/shower;Shower seat;Cane - single point          Prior Functioning/Environment Prior Level of Function : Independent/Modified Independent             Mobility Comments: no AD required per pt ADLs Comments: Ind with ADLs, IADLs, drives Home O2 at night 2L        OT Problem List: Pain;Decreased activity tolerance      OT Treatment/Interventions:      OT Goals(Current goals can be found in the care plan section) Acute Rehab OT Goals Patient Stated Goal: go home OT Goal Formulation: With patient Time For Goal Achievement: 12/04/22 Potential to Achieve Goals: Good ADL Goals Pt Will Perform Lower Body Bathing: with set-up;with modified independence Pt Will Perform Lower Body Dressing: with set-up;with modified independence Pt Will Transfer to Toilet: with modified independence;ambulating Pt Will Perform Toileting - Clothing Manipulation and hygiene: with modified independence;sit to/from stand Additional ADL Goal #1: Pt will safely gather ADL and toiletry items withSup - Mod I in prep for selfcare tasks  OT Frequency:      Co-evaluation              AM-PAC OT "6 Clicks" Daily Activity     Outcome Measure Help from another person eating meals?: None Help from another person taking care of personal grooming?: None Help from  another person toileting, which includes using toliet, bedpan, or urinal?: A Little Help from another person bathing (including washing, rinsing, drying)?: A Little Help from another person to put on and taking off regular upper body clothing?: None Help from another person to put on and taking off regular lower body clothing?: A Little 6 Click Score: 21   End of Session Equipment Utilized During Treatment: Oxygen;Gait belt;Rolling walker (2 wheels) Nurse Communication: Mobility status;Other (comment)  Activity Tolerance: Patient tolerated treatment well Patient left: with call bell/phone within reach;in bed  OT Visit Diagnosis: Pain Pain - Right/Left: Right Pain - part of body: Ankle and joints of foot                Time: 1610-9604 OT Time Calculation (min): 29 min Charges:  OT General Charges $OT Visit: 1 Visit OT Evaluation $OT Eval Moderate Complexity: 1 Mod OT Treatments $Therapeutic Activity: 8-22 mins    Galen Manila 11/20/2022, 12:51 PM

## 2022-11-20 NOTE — Progress Notes (Addendum)
  Progress Note    11/20/2022 7:07 AM 1 Day Post-Op  Subjective:  says his leg feels better and feels like the swelling is better.   Afebrile HR 60's-90's  90's-130's systolic 97% 2LO2NC  Vitals:   11/20/22 0300 11/20/22 0400  BP: (!) 95/42 121/66  Pulse: 77 69  Resp: 18 13  Temp: 97.7 F (36.5 C) 97.8 F (36.6 C)  SpO2: 96% 94%    Physical Exam: General:  no distress; sitting up in bed eating breakfast Cardiac:  regular Lungs:  non labored Incisions:  right groin incision is clean and dry Extremities:  brisk doppler flow right PT/DP/pero Abdomen:  soft  CBC    Component Value Date/Time   WBC 11.4 (H) 11/20/2022 0033   RBC 3.36 (L) 11/20/2022 0033   HGB 10.7 (L) 11/20/2022 0033   HGB 14.5 10/02/2022 1054   HCT 32.2 (L) 11/20/2022 0033   HCT 44.2 10/02/2022 1054   PLT 282 11/20/2022 0033   PLT 283 10/02/2022 1054   MCV 95.8 11/20/2022 0033   MCV 97 10/02/2022 1054   MCH 31.8 11/20/2022 0033   MCHC 33.2 11/20/2022 0033   RDW 13.4 11/20/2022 0033   RDW 12.4 10/02/2022 1054   LYMPHSABS 1,752 11/08/2022 1450   MONOABS 0.9 11/01/2022 0116   EOSABS 423 11/08/2022 1450   BASOSABS 151 11/08/2022 1450    BMET    Component Value Date/Time   NA 135 11/20/2022 0033   NA 142 10/02/2022 1054   K 3.9 11/20/2022 0033   CL 97 (L) 11/20/2022 0033   CO2 23 11/20/2022 0033   GLUCOSE 370 (H) 11/20/2022 0033   BUN 23 11/20/2022 0033   BUN 25 10/02/2022 1054   CREATININE 1.17 11/20/2022 0033   CREATININE 1.40 (H) 11/08/2022 1450   CALCIUM 8.9 11/20/2022 0033   GFRNONAA >60 11/20/2022 0033   GFRAA >60 02/12/2019 0956    INR    Component Value Date/Time   INR 1.2 08/30/2022 1416     Intake/Output Summary (Last 24 hours) at 11/20/2022 0707 Last data filed at 11/20/2022 0540 Gross per 24 hour  Intake 2048.86 ml  Output 4590 ml  Net -2541.14 ml      Assessment/Plan:  77 y.o. male is s/p:   Extensive right common femoral endarterectomy including external  iliac, profunda femoris and superficial femoral endarterectomy with vein patch angioplasty and RLE angiography  1 Day Post-Op   -pt with brisk doppler flow right foot -discussed groin wound care with pt. -mobilize out of bed today and ambulate -anticipate discharge home tomorrow -hyperglycemic overnight.  Did not restart home dose of insulin due to being npo.  Will ask DM coordinator to help with his meds while he is hospitalized.  No metformin until tomorrow given he received contrast yesterday.  -acute surgical blood loss anemia-pt tolerating currently -DVT prophylaxis:  sq heparin starts this morning. -effient/asa/statin continued   Doreatha Massed, PA-C [facility] [phone] 11/20/2022 7:07 AM  I have independently interviewed and examined patient and agree with PA assessment and plan above.  Strong left posterior tibial signal.  Out of bed today likely home in the next day or 2.  Celes Dedic C. Randie Heinz, MD [facility] [place] Office: [phone] Pager: [phone]

## 2022-11-20 NOTE — Consult Note (Signed)
WOC Nurse Consult Note: Reason for Consult:Right LE with splitting of epidermis. Photodocumentation uploaded to EMR by Provider and all notes appreciated. Wound type:PAD Pressure Injury POA: N/A Measurement:N/A Wound bed:red, dry Drainage (amount, consistency, odor) none Periwound:erythematous, dry, flaking, splitting of epidermis Dressing procedure/placement/frequency: I have provided guidance for Nursing for the application of Silvadene (silver sulfadiazine cream) to the open areas after cleansing with either tap water (at home) or NS and gently patting dry. Cream is to be topped with a dry dressing (ABD in house) and secured with a few turns of Kerlix roll gauze/paper tape. Heel is to be floated.  WOC nursing team will not follow, but will remain available to this patient, the nursing and medical teams.  Please re-consult if needed.  Thank you for inviting [place] to participate in this patient's Plan of Care.  Ladona Mow, MSN, RN, CNS, GNP, Leda Min, [employer], [employer] phone:  [phone]

## 2022-11-20 NOTE — Evaluation (Signed)
Physical Therapy Evaluation Patient Details Name: Luke [name] MRN: [id number] DOB: 1945/08/31 Today's Date: 11/20/2022  History of Present Illness  77 y.o. male admitted 11/19/22 for extensive right common femoral endarterectomy including external iliac, profunda femoris and superficial femoral endarterectomy with vein patch angioplasty and RLE angiography.  PMH positive for AAA, afib, chronic systolic CHF with AICD, stage 3 CKD, DM, HTN, HLD, IPF not on home O2, and PAD and recent admissions with two cardiac stent procedures and due to CHF exacerbation.  Clinical Impression  Patient presents with decreased mobility due to pain post procedure and decreased cardiopulmonary endurance.  Reports has O2 at home and used mainly at night.  Today on room air in the room with SpO2 94% with ambulation down to 84% so placed on 2L O2 back up to 94%.  Patient previously active and walking on treadmill and using stationary bike at home.  Discussed using treadmill mainly and for very short time initially (5 min) until sees how he tolerates then slow progression like 1 minute added on each day or every other day.  Patient should continue to progress without follow up PT at d/c, but will follow in acute setting to ensure safety on stairs.       Assistance Recommended at Discharge Intermittent Supervision/Assistance  If plan is discharge home, recommend the following:  Can travel by private vehicle  Assist for transportation;Assistance with cooking/housework        Equipment Recommendations None recommended by PT  Recommendations for Other Services       Functional Status Assessment Patient has had a recent decline in their functional status and demonstrates the ability to make significant improvements in function in a reasonable and predictable amount of time.     Precautions / Restrictions Precautions Precautions: Fall Precaution Comments: watch O2 sat      Mobility  Bed Mobility                General bed mobility comments: Up in the recliner    Transfers Overall transfer level: Needs assistance Equipment used: None Transfers: Sit to/from Stand Sit to Stand: Supervision           General transfer comment: up on his feet when PT arrived with gown for his back    Ambulation/Gait Ambulation/Gait assistance: Supervision Gait Distance (Feet): 400 Feet Assistive device: None Gait Pattern/deviations: Step-through pattern, Decreased stride length, Shuffle, Trunk flexed       General Gait Details: walker in the room, but requesting to walk without so no device in hallway; wearing slippers from home with some shuffling noted due to slippers, pt with flexed posture, cues for forward gaze, but reports head flexed since neck surgery,  Stairs            Wheelchair Mobility     Tilt Bed    Modified Rankin (Stroke Patients Only)       Balance Overall balance assessment: Mild deficits observed, not formally tested                                           Pertinent Vitals/Pain Pain Assessment Faces Pain Scale: Hurts a little bit Pain Location: R groin revascularization site Pain Descriptors / Indicators: Tender Pain Intervention(s): Monitored during session    Home Living Family/patient expects to be discharged to:: Private residence Living Arrangements: Spouse/significant other Available Help at Discharge: Family;Available 24  hours/day Type of Home: House Home Access: Stairs to enter Entrance Stairs-Rails: Doctor, general practice of Steps: 3   Home Layout: One level Home Equipment: Grab bars - tub/shower;Shower seat;Cane - single point Additional Comments: Home O2 at night, 2L    Prior Function Prior Level of Function : Independent/Modified Independent             Mobility Comments: no AD required per pt ADLs Comments: Ind with ADLs, IADLs, drives     Hand Dominance   Dominant Hand: Right     Extremity/Trunk Assessment   Upper Extremity Assessment Upper Extremity Assessment: Defer to OT evaluation    Lower Extremity Assessment Lower Extremity Assessment: RLE deficits/detail RLE Deficits / Details: AROM WFL, some pain with hip flexion at groin site; strength at least 4/5 RLE Sensation: decreased light touch (reports improving sensation since surgery)       Communication   Communication: No difficulties  Cognition Arousal/Alertness: Awake/alert Behavior During Therapy: WFL for tasks assessed/performed Overall Cognitive Status: Within Functional Limits for tasks assessed                                          General Comments General comments (skin integrity, edema, etc.): on RA initially and with ambulation SpO2 84% once back in room HR 90's, applied O2 @2LPM  and SpO2 back to 94%, initiated incentive spirometer and pt performed x 5 reps up to .  Encouraged 5x each hour.  Titrated O2 down to 1LPM with SpO2 98%.  Educated if nursing staff manages lines good to walk hallway but can also to sit to stand at bedside.    Exercises     Assessment/Plan    PT Assessment Patient needs continued PT services  PT Problem List Decreased activity tolerance;Decreased mobility;Pain;Cardiopulmonary status limiting activity;Decreased balance       PT Treatment Interventions Gait training;Balance training;Stair training;Functional mobility training;Patient/family education    PT Goals (Current goals can be found in the Care Plan section)  Acute Rehab PT Goals Patient Stated Goal: go home and mow lawc PT Goal Formulation: With patient/family Time For Goal Achievement: 11/27/22 Potential to Achieve Goals: Good    Frequency Min 1X/week     Co-evaluation               AM-PAC PT "6 Clicks" Mobility  Outcome Measure Help needed turning from your back to your side while in a flat bed without using bedrails?: None Help needed moving from lying on your  back to sitting on the side of a flat bed without using bedrails?: None Help needed moving to and from a bed to a chair (including a wheelchair)?: None Help needed standing up from a chair using your arms (e.g., wheelchair or bedside chair)?: None Help needed to walk in hospital room?: A Little Help needed climbing 3-5 steps with a railing? : Total 6 Click Score: 20    End of Session Equipment Utilized During Treatment: Oxygen Activity Tolerance: Patient tolerated treatment well Patient left: in chair;with call bell/phone within reach;with family/visitor present   PT Visit Diagnosis: Other abnormalities of gait and mobility (R26.89)    Time: 4034-7425 PT Time Calculation (min) (ACUTE ONLY): 25 min   Charges:   PT Evaluation $PT Eval Low Complexity: 1 Low PT Treatments $Gait Training: 8-22 mins PT General Charges $$ ACUTE PT VISIT: 1 Visit  Sheran Lawless, PT Acute Rehabilitation Services Office:[phone] 11/20/2022   Elray Mcgregor 11/20/2022, 4:49 PM

## 2022-11-20 NOTE — Progress Notes (Signed)
PHARMACIST LIPID MONITORING   Luke [name] is a 77 y.o. male admitted on 11/19/2022 with PAD s/p endarterectomy.  Pharmacy has been consulted to optimize lipid-lowering therapy with the indication of secondary prevention for clinical ASCVD.  Recent Labs:  Lipid Panel (last 6 months):   Lab Results  Component Value Date   CHOL 110 11/20/2022   TRIG 170 (H) 11/20/2022   HDL 31 (L) 11/20/2022   CHOLHDL 3.5 11/20/2022   VLDL 34 11/20/2022   LDLCALC 45 11/20/2022   LDLDIRECT 53 11/01/2022    Hepatic function panel (last 6 months):   Lab Results  Component Value Date   AST 21 11/08/2022   ALT 19 11/08/2022   ALKPHOS 83 10/29/2022   BILITOT 0.5 11/08/2022    SCr (since admission):   Serum creatinine: 1.17 mg/dL 16/10/96 0454 Estimated creatinine clearance: 58.6 mL/min  Current therapy and lipid therapy tolerance Current lipid-lowering therapy: simvastatin 20mg  Documented or reported allergies or intolerances to lipid-lowering therapies (if applicable): atorvastatin, rosuvastatin  Assessment:   LDL is at goal on home simvastatin. No indication to adjust statin therapy at this time. Previous intolerances to high-intensity statins  Plan:    1.Statin intensity (high intensity recommended for all patients regardless of the LDL):  Statin intolerance noted. No statin changes due to serious side effects (ex. Myalgias with at least 2 different statins).  2.Add ezetimibe (if any one of the following):   Not indicated at this time.  3.Refer to lipid clinic:   No  4.Follow-up with:  Primary care provider - [name], MD  5.Follow-up labs after discharge:  No changes in lipid therapy, repeat a lipid panel in one year.      Ellis Savage, PharmD Clinical Pharmacist 11/20/2022, 6:58 AM

## 2022-11-21 LAB — GLUCOSE, CAPILLARY
Glucose-Capillary: 166 mg/dL — ABNORMAL HIGH (ref 70–99)
Glucose-Capillary: 329 mg/dL — ABNORMAL HIGH (ref 70–99)

## 2022-11-21 MED ORDER — OXYCODONE-ACETAMINOPHEN 5-325 MG PO TABS: 1.0000 | ORAL_TABLET | Freq: Four times a day (QID) | ORAL | 0 refills | Status: DC | PRN

## 2022-11-21 MED ORDER — SILVER SULFADIAZINE 1 % EX CREA: TOPICAL_CREAM | Freq: Every day | CUTANEOUS | 0 refills | Status: DC

## 2022-11-21 NOTE — Plan of Care (Signed)
Pt A&OX4, Pt d/c to home with [facility] for LRE wound care. Wound care provided by bedside nurse before discharge. Pt AVS reviewed with him and spouse. All follow up questions answered. IV and tele removed. Belongings returned. BP (!) 91/50 (BP Location: Left Arm)   Pulse 66   Temp 98 F (36.7 C) (Oral)   Resp 18   Ht 6\' 2"  (1.88 m)   Wt 77.1 kg   SpO2 99%   BMI 21.83 kg/m  Pt escorted off the floor by this recorder. Reva Bores 11/21/22 12:19 PM

## 2022-11-21 NOTE — TOC Transition Note (Signed)
Transition of Care (TOC) - CM/SW Discharge Note Donn Pierini RN, BSN Transitions of Care Unit 4E- RN Case Manager See Treatment Team for direct phone #   Patient Details  Name: OZAN MACLAY MRN: [id number] Date of Birth: [birth year]  Transition of Care [facility]) CM/SW Contact:  Darrold Span, RN Phone Number: 11/21/2022, 12:07 PM   Clinical Narrative:    Pt stable for transition home today, Pt active with [facility] for HHRN/PT/OT- noted per PT/OT eval no f/u recommended this time for PT/OT needs.  CM spoke with [facility] liaison and they will follow for [facility] only this time for wound needs (can add back PT/OT if RN sees a change in the home).  Pt has home 02 w/ [employer], No other DME needs noted  Resumption order placed for [facility] - [facility] to follow up to schedule.   Family to transport home.   TOC to sign off as no further intervention needed at this time.    Final next level of care: Home w Home Health Services Barriers to Discharge: No Barriers Identified   Patient Goals and CMS Choice CMS Medicare.gov Compare Post Acute Care list provided to:: Patient Choice offered to / list presented to : Patient  Discharge Placement               Home w/ [facility]          Discharge Plan and Services Additional resources added to the After Visit Summary for     Discharge Planning Services: CM Consult Post Acute Care Choice: Home Health, Resumption of Svcs/PTA Provider          DME Arranged: N/A DME Agency: NA       HH Arranged: RN HH Agency: [facility] Date HH Agency Contacted: 11/21/22 Time HH Agency Contacted: 1100 Representative spoke with at [facility] Agency: Amy  Social Determinants of Health (SDOH) Interventions SDOH Screenings   Food Insecurity: No Food Insecurity (11/04/2022)  Housing: Low Risk  (11/04/2022)  Transportation Needs: No Transportation Needs (11/04/2022)  Utilities: Not At Risk (10/30/2022)  Alcohol Screen: Low Risk  (10/30/2022)  Depression  (PHQ2-9): Low Risk  (10/17/2022)  Financial Resource Strain: Low Risk  (10/30/2022)  Physical Activity: Sufficiently Active (10/16/2022)  Social Connections: Moderately Integrated (10/16/2022)  Stress: No Stress Concern Present (10/16/2022)  Tobacco Use: Medium Risk (11/19/2022)     Readmission Risk Interventions    11/21/2022   12:07 PM 11/01/2022    2:20 PM  Readmission Risk Prevention Plan  Post Dischage Appt  Complete  Medication Screening  Complete  Transportation Screening Complete Complete  PCP or Specialist Appt within 5-7 Days Complete   Home Care Screening Complete   Medication Review (RN CM) Complete

## 2022-11-21 NOTE — Discharge Summary (Signed)
Discharge Summary     Luke [name] May 09, 1945 77 y.o. male  [id number]  Admission Date: 11/19/2022  Discharge Date: 11/21/2022  Physician: Juventino Slovak*  Admission Diagnosis: PAD (peripheral artery disease) (HCC) [I73.9]  HPI:   This is a 77 y.o. male ***  Hospital Course:  The patient was admitted to the hospital and taken to the operating room on 11/19/2022 and underwent: ***    Findings: ***  The pt tolerated the procedure well and was transported to the PACU in {DESC; GOOD/FAIR/POOR:18685} condition.   By POD 1, ***   CBC    Component Value Date/Time   WBC 11.4 (H) 11/20/2022 0033   RBC 3.36 (L) 11/20/2022 0033   HGB 10.7 (L) 11/20/2022 0033   HGB 14.5 10/02/2022 1054   HCT 32.2 (L) 11/20/2022 0033   HCT 44.2 10/02/2022 1054   PLT 282 11/20/2022 0033   PLT 283 10/02/2022 1054   MCV 95.8 11/20/2022 0033   MCV 97 10/02/2022 1054   MCH 31.8 11/20/2022 0033   MCHC 33.2 11/20/2022 0033   RDW 13.4 11/20/2022 0033   RDW 12.4 10/02/2022 1054   LYMPHSABS 1,752 11/08/2022 1450   MONOABS 0.9 11/01/2022 0116   EOSABS 423 11/08/2022 1450   BASOSABS 151 11/08/2022 1450    BMET    Component Value Date/Time   NA 135 11/20/2022 0033   NA 142 10/02/2022 1054   K 3.9 11/20/2022 0033   CL 97 (L) 11/20/2022 0033   CO2 23 11/20/2022 0033   GLUCOSE 370 (H) 11/20/2022 0033   BUN 23 11/20/2022 0033   BUN 25 10/02/2022 1054   CREATININE 1.17 11/20/2022 0033   CREATININE 1.40 (H) 11/08/2022 1450   CALCIUM 8.9 11/20/2022 0033   GFRNONAA >60 11/20/2022 0033   GFRAA >60 02/12/2019 0956     Discharge Instructions     Call MD for:  redness, tenderness, or signs of infection (pain, swelling, bleeding, redness, odor or green/yellow discharge around incision site)   Complete by: As directed    Call MD for:  severe or increased pain, loss or decreased feeling  in affected limb(s)   Complete by: As directed    Call MD for:  temperature >100.5   Complete  by: As directed    Discharge instructions   Complete by: As directed    Silvadene (silver sulfadiazine cream) to the open areas of right leg after cleansing with soap and water and gently patting dry. Cream is to be topped with a dry dressing  and secured with a few turns of Kerlix roll gauze/paper tape. Heel is to be floated daily.   Driving Restrictions   Complete by: As directed    No driving until returning to office and while taking pain medication   Lifting restrictions   Complete by: As directed    No heavy lifting for 2 weeks   Resume previous diet   Complete by: As directed        Discharge Diagnosis:  PAD (peripheral artery disease) (HCC) [I73.9]  Secondary Diagnosis: Patient Active Problem List   Diagnosis Date Noted   PAD (peripheral artery disease) (HCC) 11/19/2022   Acute on chronic systolic (congestive) heart failure (HCC) 10/29/2022   Pressure injury of skin 10/29/2022   Cellulitis 10/20/2022   Angina pectoris (HCC) 10/10/2022   CAD (coronary artery disease) 09/18/2022   CKD (chronic kidney disease) stage 3, GFR 30-59 ml/min (HCC) 09/18/2022   AKI (acute kidney injury) (HCC) 08/21/2021   Enteritis 08/21/2021  IPF (idiopathic pulmonary fibrosis) (HCC) 03/21/2021   Pain due to onychomycosis of toenails of both feet 08/10/2019   Chronic HFrEF (heart failure with reduced ejection fraction) (HCC) 06/08/2019   Biventricular ICD (implantable cardioverter-defibrillator) in place 06/08/2019   Esophageal obstruction due to food impaction    Esophageal stricture    Dysphagia 11/19/2017   Pancreatitis 11/10/2017   Tinnitus 05/23/2015   Advance care planning 10/20/2014   S/P cervical spinal fusion 06/09/2014   Stenosis of cervical spine with myelopathy (HCC) 12/27/2013   Central cord syndrome (HCC) 06/24/2013   Status post abdominal aortic aneurysm repair 12/30/2012   HLD (hyperlipidemia) 09/16/2012   SK (seborrheic keratosis) 02/12/2012   Ischemic cardiomyopathy     LBBB (left bundle branch block)    AAA (abdominal aortic aneurysm) (HCC) 10/08/2010   PVD (peripheral vascular disease) (HCC) 10/08/2010   ORGANIC IMPOTENCE 07/05/2010   Essential hypertension, benign 02/27/2010   Type 2 diabetes mellitus with hyperlipidemia (HCC) 11/16/2009   Past Medical History:  Diagnosis Date   AAA (abdominal aortic aneurysm) (HCC) 2011   Per vascular surgery   Atrial fibrillation (HCC)    CAD (coronary artery disease)    Presumed CAD with nuclear scan October 09, 2011,  large anteroseptal MI and inferior MI. Catheterization scheduled October 15, 2011   Cardiomyopathy [facility])    Nuclear, October 09, 2011, EF 30%, multiple focal wall motion abnormalities   Chronic kidney disease    CKD3   COVID-19    2021   Diabetes mellitus    type II   GERD (gastroesophageal reflux disease)    Silent   HLD (hyperlipidemia)    Hypertension    white coat HTN-- often elevated in office and controlled on outside checks.   ICD (implantable cardioverter-defibrillator) in place    CRT-D placed March, 2014 complete heart block and k dysfunction   IPF (idiopathic pulmonary fibrosis) (HCC)    LBBB (left bundle branch block)    LBBB on EKG October 11, 2011,  no prior EKG has been done   Low testosterone    Hx of   On home oxygen therapy    2L as needed   Pacemaker    PAD (peripheral artery disease) (HCC)    Pancreatitis    Umbilical hernia      Allergies as of 11/21/2022       Reactions   Aldactone [spironolactone] Other (See Comments)   Hyperkalemia   Brilinta [ticagrelor] Shortness Of Breath   Numbness in hands and feet Blurry vision   Clopidogrel Rash   Redness and Itchiness   Codeine Rash, Hives   Amoxicillin Hives   Atorvastatin    Myalgias with lipitor.  Does tolerate simvastatin.     Januvia [sitagliptin] Other (See Comments)   Diarrhea and heart racing   Jardiance [empagliflozin] Other (See Comments)   Polyuria; excessive weight loss   Lisinopril    Possible cause of  pancreatitis   Rosuvastatin Other (See Comments)   myalgia        Medication List     TAKE these medications    Accu-Chek Aviva Plus test strip Generic drug: glucose blood AS DIRECTED TO CHECK BLOOD SUGAR TWICE DAILY   albuterol 108 (90 Base) MCG/ACT inhaler Commonly known as: VENTOLIN HFA Inhale 2 puffs into the lungs every 6 (six) hours as needed for wheezing or shortness of breath.   amLODipine 10 MG tablet Commonly known as: NORVASC TAKE 1 TABLET(10 MG) BY MOUTH DAILY   aspirin EC 81  MG tablet Take 1 tablet (81 mg total) by mouth daily.   carvedilol 25 MG tablet Commonly known as: COREG TAKE 1 TABLET BY MOUTH TWICE DAILY WITH MEALS   cetirizine 10 MG tablet Commonly known as: ZYRTEC Take 1 tablet (10 mg total) by mouth daily.   doxycycline 100 MG capsule Commonly known as: VIBRAMYCIN Take 1 capsule (100 mg total) by mouth 2 (two) times daily.   furosemide 40 MG tablet Commonly known as: LASIX Take 1 tablet (40 mg total) by mouth daily as needed for edema or fluid (weight 178 lbs or higher.).   glipiZIDE 5 MG tablet Commonly known as: GLUCOTROL Take 1 tablet (5 mg total) by mouth 2 (two) times daily before a meal.   Incruse Ellipta 62.5 MCG/ACT Aepb Generic drug: umeclidinium bromide Inhale 1 puff into the lungs daily.   isosorbide mononitrate 30 MG 24 hr tablet Commonly known as: IMDUR Take 1 tablet (30 mg total) by mouth daily.   Lantus SoloStar 100 UNIT/ML Solostar Pen Generic drug: insulin glargine INJECT 0.3 TO 0.35 MLS(30 TO 35 UNITS) INTO THE SKIN EVERY DAY   metFORMIN 500 MG tablet Commonly known as: GLUCOPHAGE TAKE 2 TABLETS BY MOUTH TWICE DAILY WITH FOOD   multivitamin with minerals Tabs tablet Take 1 tablet by mouth in the morning.   mupirocin ointment 2 % Commonly known as: BACTROBAN Apply topically 2 (two) times daily. Place over ulcerated wound right leg for 10 days.   oxyCODONE-acetaminophen 5-325 MG tablet Commonly known as:  Percocet Take 1 tablet by mouth every 6 (six) hours as needed for severe pain.   pantoprazole 40 MG tablet Commonly known as: Protonix Take 1 tablet (40 mg total) by mouth daily.   prasugrel 10 MG Tabs tablet Commonly known as: Effient Take 1 tablet (10 mg total) by mouth daily.   silver sulfADIAZINE 1 % cream Commonly known as: SILVADENE Apply topically daily. Start taking on: November 22, 2022   simvastatin 20 MG tablet Commonly known as: ZOCOR Take 1 tablet (20 mg total) by mouth at bedtime.   valsartan 40 MG tablet Commonly known as: DIOVAN Take 20 mg by mouth daily with supper.   VITAMIN B-12 PO Take 2,000 mcg by mouth in the morning.   Vitamin D3 50 MCG (2000 UT) Tabs Take 2,000 Units by mouth in the morning.        Discharge Instructions: [facility] Discharge instructions Lower Extremity Bypass Surgery  Please refer to the following instruction for your post-procedure care. Your surgeon or physician assistant will discuss any changes with you.  Activity  You are encouraged to walk as much as you can. You can slowly return to normal activities during the month after your surgery. Avoid strenuous activity and heavy lifting until your doctor tells you it's OK. Avoid activities such as vacuuming or swinging a golf club. Do not drive until your doctor give the OK and you are no longer taking prescription pain medications. It is also normal to have difficulty with sleep habits, eating and bowel movement after surgery. These will go away with time.  Bathing/Showering  You may shower after you go home. Do not soak in a bathtub, hot tub, or swim until the incision heals completely.  Incision Care  Clean your incision with mild soap and water. Shower every day. Pat the area dry with a clean towel. You do not need a bandage unless otherwise instructed. Do not apply any ointments or creams to your  incision. If you have open wounds you will be  instructed how to care for them or a visiting nurse may be arranged for you. If you have staples or sutures along your incision they will be removed at your post-op appointment. You may have skin glue on your incision. Do not peel it off. It will come off on its own in about one week.  Wash the groin wound with soap and water daily and pat dry. (No tub bath-only shower)  Then put a dry gauze or washcloth in the groin to keep this area dry to help prevent wound infection.  Do this daily and as needed.  Do not use Vaseline or neosporin on your incisions.  Only use soap and water on your incisions and then protect and keep dry.  Diet  Resume your normal diet. There are no special food restrictions following this procedure. A low fat/ low cholesterol diet is recommended for all patients with vascular disease. In order to heal from your surgery, it is CRITICAL to get adequate nutrition. Your body requires vitamins, minerals, and protein. Vegetables are the best source of vitamins and minerals. Vegetables also provide the perfect balance of protein. Processed food has little nutritional value, so try to avoid this.  Medications  Resume taking all your medications unless your doctor or Physician Assistant tells you not to. If your incision is causing pain, you may take over-the-counter pain relievers such as acetaminophen (Tylenol). If you were prescribed a stronger pain medication, please aware these medication can cause nausea and constipation. Prevent nausea by taking the medication with a snack or meal. Avoid constipation by drinking plenty of fluids and eating foods with high amount of fiber, such as fruits, vegetables, and grains. Take Colace 100 mg (an over-the-counter stool softener) twice a day as needed for constipation.  Do not take Tylenol if you are taking prescription pain medications.  Follow Up  Our office will schedule a follow up appointment 2-3 weeks following discharge.  Please call us  immediately for any of the following conditions  Severe or worsening pain in your legs or feet while at rest or while walking Increase pain, redness, warmth, or drainage (pus) from your incision site(s) Fever of 101 degree or higher The swelling in your leg with the bypass suddenly worsens and becomes more painful than when you were in the hospital If you have been instructed to feel your graft pulse then you should do so every day. If you can no longer feel this pulse, call the office immediately. Not all patients are given this instruction.  Leg swelling is common after leg bypass surgery.  The swelling should improve over a few months following surgery. To improve the swelling, you may elevate your legs above the level of your heart while you are sitting or resting. Your surgeon or physician assistant may ask you to apply an ACE wrap or wear compression (TED) stockings to help to reduce swelling.  Reduce your risk of vascular disease  Stop smoking. If you would like help call [facility] at 1-800-QUIT-NOW (725-227-3314) or [place] at (331)307-2868.  Manage your cholesterol Maintain a desired weight Control your diabetes weight Control your diabetes Keep your blood pressure down  If you have any questions, please call the office at [phone]   Prescriptions given: 1.  Roxicet #8 No Refill 2.  Silvadene   Disposition: home  Patient's condition: is Good  Follow up: 1. [facility] in 2-3 weeks for incision check.   Lelon Mast  Aralyn Nowak, PA-C [facility] [phone] 11/21/2022  11:15 AM  - For VQI Registry use ---   Post-op:  Wound infection: No  Graft infection: No  Transfusion: No    If yes, n/a units given New Arrhythmia: No Ipsilateral amputation: No, [ ]  Minor, [ ]  BKA, [ ]  AKA Discharge patency: [x ] Primary, [ ]  Primary assisted, [ ]  Secondary, [ ]  Occluded Patency judged by: [x ] Dopper only, [ ]  Palpable graft pulse, []  Palpable distal pulse, [ ]   ABI inc. > 0.15, [ ]  Duplex Discharge ABI: R not done, L  D/C Ambulatory Status: Ambulatory  Complications: MI: No, [ ]  Troponin only, [ ]  EKG or Clinical CHF: No Resp failure:No, [ ]  Pneumonia, [ ]  Ventilator Chg in renal function: No, [ ]  Inc. Cr > 0.5, [ ]  Temp. Dialysis,  [ ]  Permanent dialysis Stroke: No, [ ]  Minor, [ ]  Major Return to OR: No  Reason for return to OR: [ ]  Bleeding, [ ]  Infection, [ ]  Thrombosis, [ ]  Revision  Discharge medications: Statin use:  yes ASA use:  yes Plavix use:  no Beta blocker use: yes CCB use:  Yes ACEI use:   no ARB use:  yes Coumadin use: no

## 2022-11-21 NOTE — Progress Notes (Addendum)
  Progress Note    11/21/2022 6:44 AM 2 Days Post-Op  Subjective:  no complaints; says his leg feels better.  Wants to go home.   Afebrile HR 60's-70's  110's-130's systolic 94% RA  Vitals:   11/20/22 2335 11/21/22 0310  BP: 125/69 133/66  Pulse: 73 60  Resp: 14 15  Temp: 98.1 F (36.7 C) 98 F (36.7 C)  SpO2: 92% 92%    Physical Exam: General:  no distress Lungs:  non labored Incisions:  right groin looks good.  Extremities:  right foot is warm and well perfused. Right foot with motor and sensory in tact.  Right calf remains soft.  Dressing to RLE with silvadene in place.    CBC    Component Value Date/Time   WBC 11.4 (H) 11/20/2022 0033   RBC 3.36 (L) 11/20/2022 0033   HGB 10.7 (L) 11/20/2022 0033   HGB 14.5 10/02/2022 1054   HCT 32.2 (L) 11/20/2022 0033   HCT 44.2 10/02/2022 1054   PLT 282 11/20/2022 0033   PLT 283 10/02/2022 1054   MCV 95.8 11/20/2022 0033   MCV 97 10/02/2022 1054   MCH 31.8 11/20/2022 0033   MCHC 33.2 11/20/2022 0033   RDW 13.4 11/20/2022 0033   RDW 12.4 10/02/2022 1054   LYMPHSABS 1,752 11/08/2022 1450   MONOABS 0.9 11/01/2022 0116   EOSABS 423 11/08/2022 1450   BASOSABS 151 11/08/2022 1450    BMET    Component Value Date/Time   NA 135 11/20/2022 0033   NA 142 10/02/2022 1054   K 3.9 11/20/2022 0033   CL 97 (L) 11/20/2022 0033   CO2 23 11/20/2022 0033   GLUCOSE 370 (H) 11/20/2022 0033   BUN 23 11/20/2022 0033   BUN 25 10/02/2022 1054   CREATININE 1.17 11/20/2022 0033   CREATININE 1.40 (H) 11/08/2022 1450   CALCIUM 8.9 11/20/2022 0033   GFRNONAA >60 11/20/2022 0033   GFRAA >60 02/12/2019 0956    INR    Component Value Date/Time   INR 1.2 08/30/2022 1416     Intake/Output Summary (Last 24 hours) at 11/21/2022 0644 Last data filed at 11/20/2022 1100 Gross per 24 hour  Intake --  Output 200 ml  Net -200 ml      Assessment/Plan:  77 y.o. male is s/p:  Extensive right common femoral endarterectomy including  external iliac, profunda femoris and superficial femoral endarterectomy with vein patch angioplasty and RLE angiography   2 Days Post-Op   -right foot is warm and well perfused. Silvadene dressing in place to right leg.  Calf remains soft.  -discharge home today.  F/u in 2-3 weeks for incision check. -PT/OT - no follow up recommended -DVT prophylaxis:  sq heparin   Doreatha Massed, PA-C [facility] [phone] 11/21/2022 6:44 AM  I have independently interviewed and examined patient and agree with PA assessment and plan above.  Hopeful that extensive right common femoral endarterectomy will provide enough blood flow for healing.  Silvadene has been applied to the leg he has no pain in the leg and full motor function at this time.  Plan for discharge and follow-up in a couple weeks for incision and wound check.  Bettina Warn C. Randie Heinz, MD [facility] [place] Office: [phone] Pager: [phone]

## 2022-11-21 NOTE — Progress Notes (Signed)
Physical Therapy Treatment Patient Details Name: Luke [name] MRN: [id number] DOB: 24-Jun-1945 Today's Date: 11/21/2022   History of Present Illness 77 y.o. male admitted 11/19/22 for extensive right common femoral endarterectomy including external iliac, profunda femoris and superficial femoral endarterectomy with vein patch angioplasty and RLE angiography.  PMH positive for AAA, afib, chronic systolic CHF with AICD, stage 3 CKD, DM, HTN, HLD, IPF not on home O2, and PAD and recent admissions with two cardiac stent procedures and due to CHF exacerbation.    PT Comments  Pt received in supine and agreeable to session. Pt able to perform all mobility tasks with min guard-supervision for safety. Pt able to tolerate increased gait distance demonstrating some instability and intermittent drifting, but no overt LOB. Pt able to perform stair trial with min guard for safety and no unsteadiness. Pt education provided on activity progression/pacing, O2 monitoring, and use of cane in unsteady and outdoor settings for reduced fall risk. Pt continues to benefit from PT services to progress toward functional mobility goals.     Assistance Recommended at Discharge Intermittent Supervision/Assistance  If plan is discharge home, recommend the following:  Can travel by private vehicle    Assist for transportation;Assistance with cooking/housework      Equipment Recommendations  None recommended by PT    Recommendations for Other Services       Precautions / Restrictions Precautions Precautions: Fall Precaution Comments: watch O2 sat Restrictions Weight Bearing Restrictions: No     Mobility  Bed Mobility Overal bed mobility: Modified Independent             General bed mobility comments: increased time and use of bedrails    Transfers Overall transfer level: Needs assistance Equipment used: None Transfers: Sit to/from Stand Sit to Stand: Supervision           General transfer  comment: from low EOB    Ambulation/Gait Ambulation/Gait assistance: Supervision Gait Distance (Feet): 500 Feet Assistive device: None Gait Pattern/deviations: Step-through pattern, Decreased stride length, Trunk flexed, Drifts right/left       General Gait Details: Pt demonstrating slow step-through pattern with intermittent drifting and slight instability, but no overt LOB. Cues for upward gaze   Stairs Stairs: Yes Stairs assistance: Min guard Stair Management: One rail Right Number of Stairs: 4 General stair comments: Pt demonstrating stair trial with min guard for safety and cues for technique. Pt able to turn on step to descend with no LOB.      Balance Overall balance assessment: Mild deficits observed, not formally tested                                          Cognition Arousal/Alertness: Awake/alert Behavior During Therapy: WFL for tasks assessed/performed Overall Cognitive Status: Within Functional Limits for tasks assessed                                          Exercises      General Comments General comments (skin integrity, edema, etc.): SpO2 87% on RA at rest. Pt ambulating on 2L O2 with drop to 88% that improved with a standing rest break and pursed lip breathing.      Pertinent Vitals/Pain Pain Assessment Pain Assessment: No/denies pain     PT Goals (current goals can  now be found in the care plan section) Acute Rehab PT Goals Patient Stated Goal: go home and mow lawn PT Goal Formulation: With patient/family Time For Goal Achievement: 11/27/22 Potential to Achieve Goals: Good Progress towards PT goals: Progressing toward goals    Frequency    Min 1X/week      PT Plan Current plan remains appropriate       AM-PAC PT "6 Clicks" Mobility   Outcome Measure  Help needed turning from your back to your side while in a flat bed without using bedrails?: None Help needed moving from lying on your back to  sitting on the side of a flat bed without using bedrails?: None Help needed moving to and from a bed to a chair (including a wheelchair)?: None Help needed standing up from a chair using your arms (e.g., wheelchair or bedside chair)?: None Help needed to walk in hospital room?: A Little Help needed climbing 3-5 steps with a railing? : A Little 6 Click Score: 22    End of Session Equipment Utilized During Treatment: Oxygen;Gait belt Activity Tolerance: Patient tolerated treatment well Patient left: in chair;with call bell/phone within reach Nurse Communication: Mobility status PT Visit Diagnosis: Other abnormalities of gait and mobility (R26.89)     Time: 6045-4098 PT Time Calculation (min) (ACUTE ONLY): 25 min  Charges:    $Gait Training: 23-37 mins PT General Charges $$ ACUTE PT VISIT: 1 Visit                     Johny Shock, PTA Acute Rehabilitation Services Secure Chat Preferred  Office:([phone]) [phone]    Johny Shock 11/21/2022, 9:51 AM

## 2022-11-21 NOTE — Consult Note (Signed)
   [facility] Inpatient Consult   11/21/2022  EFTON THOMLEY 08-May-1945 [id number]  [facility] [[facility]]  Accountable Care Organization [ACO] Patient: Medicare ACO REACH  Primary Care Provider:  [name], MD with [place] [facility] is listed to provide the transition of care follow up.  Patient was assessed for less than 30 days readmission and for restart of [facility] [[facility]] Care Management for community services. Patient was previous services in the past for diabetes management noted.  Call attempt to patient at bedside regarding being restarted with [facility] services was currently not successful to follow up on post hospital call from community [facility] team for transitional needs.  Plan:  Will make a referral request for post hospital Community Care Coordination RN follow up to offer patient diabetes management support A1C 7.9 [noted 10/23/22] and enhance wound healing support.  Of note, [facility] Care Management services does not replace or interfere with any services that are arranged by inpatient [facility] care management team.   For additional questions or referrals please contact:  Charlesetta Shanks, RN BSN CCM [facility]  [phone] business mobile phone Toll free office [phone]  *Concierge Line  [phone] Fax number: [phone] [email or web address]     .

## 2022-11-21 NOTE — Progress Notes (Signed)
Occupational Therapy Treatment Patient Details Name: Luke [name] MRN: [id number] DOB: [birth year] Today's Date: 11/21/2022   History of present illness 77 y.o. male admitted 11/19/22 for extensive right common femoral endarterectomy including external iliac, profunda femoris and superficial femoral endarterectomy with vein patch angioplasty and RLE angiography.  PMH positive for AAA, afib, chronic systolic CHF with AICD, stage 3 CKD, DM, HTN, HLD, IPF not on home O2, and PAD and recent admissions with two cardiac stent procedures and due to CHF exacerbation.   OT comments  Pt making good progress with functional goals ADL mobility for in room activity with no AD with Sup. Pt participated  in activity on RA with O2 SATs dropping to 77% after activity to stand for UB dressing, toilet and shower transfers. Pt instructed on deep, pursed lip breathing to recover to 90-95% ln less than 2 minutes. Pt eager to d/c home today   Recommendations for follow up therapy are one component of a multi-disciplinary discharge planning process, led by the attending physician.  Recommendations may be updated based on patient status, additional functional criteria and insurance authorization.    Assistance Recommended at Discharge Set up Supervision/Assistance  Patient can return home with the following  Assist for transportation;A little help with bathing/dressing/bathroom   Equipment Recommendations  None recommended by OT    Recommendations for Other Services      Precautions / Restrictions Precautions Precautions: Fall Precaution Comments: watch O2 sat Restrictions Weight Bearing Restrictions: No       Mobility Bed Mobility Overal bed mobility: Modified Independent                  Transfers Overall transfer level: Needs assistance Equipment used: None Transfers: Sit to/from Stand Sit to Stand: Supervision                 Balance Overall balance assessment: Mild deficits  observed, not formally tested                                         ADL either performed or assessed with clinical judgement   ADL Overall ADL's : Needs assistance/impaired     Grooming: Wash/dry hands;Wash/dry face;Standing;Modified independent       Lower Body Bathing: Community education officer Details (indicate cue type and reason): simulated     Lower Body Dressing: Supervision/safety   Toilet Transfer: Ambulation;Supervision/safety   Toileting- Clothing Manipulation and Hygiene: Modified independent;Sit to/from stand   Tub/ Shower Transfer: Supervision/safety;Ambulation;Grab bars   Functional mobility during ADLs: Supervision/safety General ADL Comments: pt able to gather items from shelf at sink whtout difficulty or LOB with Sup for safety    Extremity/Trunk Assessment Upper Extremity Assessment Upper Extremity Assessment: Overall WFL for tasks assessed   Lower Extremity Assessment Lower Extremity Assessment: Defer to PT evaluation   Cervical / Trunk Assessment Cervical / Trunk Assessment: Normal    Vision Baseline Vision/History: 1 Wears glasses Ability to See in Adequate Light: 0 Adequate Patient Visual Report: No change from baseline     Perception     Praxis      Cognition Arousal/Alertness: Awake/alert Behavior During Therapy: WFL for tasks assessed/performed Overall Cognitive Status: Within Functional Limits for tasks assessed  Exercises      Shoulder Instructions       General Comments      Pertinent Vitals/ Pain       Pain Assessment Pain Assessment: No/denies pain Pain Score: 0-No pain Pain Intervention(s): Monitored during session  Home Living Family/patient expects to be discharged to:: Private residence Living Arrangements: Spouse/significant other                                      Prior Functioning/Environment               Frequency  Min 2X/week        Progress Toward Goals  OT Goals(current goals can now be found in the care plan section)  Progress towards OT goals: Progressing toward goals     Plan Discharge plan remains appropriate    Co-evaluation                 AM-PAC OT "6 Clicks" Daily Activity     Outcome Measure   Help from another person eating meals?: None Help from another person taking care of personal grooming?: A Little Help from another person toileting, which includes using toliet, bedpan, or urinal?: None Help from another person bathing (including washing, rinsing, drying)?: A Little Help from another person to put on and taking off regular upper body clothing?: None Help from another person to put on and taking off regular lower body clothing?: A Little 6 Click Score: 21    End of Session Equipment Utilized During Treatment: Gait belt  OT Visit Diagnosis: Other abnormalities of gait and mobility (R26.89)   Activity Tolerance Patient tolerated treatment well   Patient Left with call bell/phone within reach;in bed;with family/visitor present   Nurse Communication          Time: 1610-9604 OT Time Calculation (min): 19 min  Charges: OT General Charges $OT Visit: 1 Visit OT Treatments $Self Care/Home Management : 8-22 mins    Galen Manila 11/21/2022, 2:01 PM

## 2022-11-22 ENCOUNTER — Telehealth: Payer: Self-pay | Admitting: *Deleted

## 2022-11-22 ENCOUNTER — Telehealth: Payer: Self-pay

## 2022-11-22 DIAGNOSIS — I10 Essential (primary) hypertension: Secondary | ICD-10-CM

## 2022-11-22 DIAGNOSIS — E1159 Type 2 diabetes mellitus with other circulatory complications: Secondary | ICD-10-CM

## 2022-11-22 NOTE — Transitions of Care (Post Inpatient/ED Visit) (Signed)
11/22/2022  Name: Luke [name] MRN: [id number] DOB: 1946-02-18  Today's TOC FU Call Status: Today's TOC FU Call Status:: Successful TOC FU Call Competed TOC FU Call Complete Date: 11/22/22  Transition Care Management Follow-up Telephone Call Date of Discharge: 11/21/22 Discharge Facility: [facility]) Type of Discharge: Inpatient Admission Primary Inpatient Discharge Diagnosis:: peripheral artery disease How have you been since you were released from the hospital?: Better Any questions or concerns?: No  Items Reviewed: Did you receive and understand the discharge instructions provided?: Yes Medications obtained,verified, and reconciled?: Yes (Medications Reviewed) (Per patient he did not get the pain medication. He did not feel he needed it.) Any new allergies since your discharge?: No Dietary orders reviewed?: No Do you have support at home?: Yes People in Home: spouse Name of Support/Comfort Primary Source: Luke [name]  Medications Reviewed Today: Medications Reviewed Today     Reviewed by Luella Cook, RN (Case Manager) on 11/22/22 at 1205  Med List Status: <None>   Medication Order Taking? Sig Documenting Provider Last Dose Status Informant  albuterol (VENTOLIN HFA) 108 (90 Base) MCG/ACT inhaler [id number] Yes Inhale 2 puffs into the lungs every 6 (six) hours as needed for wheezing or shortness of breath. Kalman Shan, MD Taking Active Self  amLODipine (NORVASC) 10 MG tablet [id number] Yes TAKE 1 TABLET(10 MG) BY MOUTH DAILY Jake Bathe, MD Taking Active Self  aspirin EC 81 MG tablet [id number] Yes Take 1 tablet (81 mg total) by mouth daily. Abagail Kitchens, PA-C Taking Active Self  carvedilol (COREG) 25 MG tablet [id number] Yes TAKE 1 TABLET BY MOUTH TWICE DAILY WITH MEALS Jake Bathe, MD Taking Active Self  cetirizine (ZYRTEC) 10 MG tablet [id number] Yes Take 1 tablet (10 mg total) by mouth daily. Alver Sorrow, NP Taking Active Self  Cholecalciferol  (VITAMIN D3) 50 MCG (2000 UT) TABS [id number] Yes Take 2,000 Units by mouth in the morning. [provider] Taking Active Self  Cyanocobalamin (VITAMIN B-12 PO) [id number] Yes Take 2,000 mcg by mouth in the morning. [provider] Taking Active Self           Med Note Daphine Deutscher, LINDA   Tue Sep 11, 2021 10:57 AM)    doxycycline (VIBRAMYCIN) 100 MG capsule [id number] Yes Take 1 capsule (100 mg total) by mouth 2 (two) times daily. Maeola Harman, MD Taking Active Self  furosemide (LASIX) 40 MG tablet [id number] Yes Take 1 tablet (40 mg total) by mouth daily as needed for edema or fluid (weight 178 lbs or higher.). Arrien, York Ram, MD Taking Active Self  glipiZIDE (GLUCOTROL) 5 MG tablet [id number] Yes Take 1 tablet (5 mg total) by mouth 2 (two) times daily before a meal. [name], MD Taking Active Self  glucose blood (ACCU-CHEK AVIVA PLUS) test strip [id number] Yes AS DIRECTED TO CHECK BLOOD SUGAR TWICE DAILY [name], MD Taking Active Self  insulin glargine (LANTUS SOLOSTAR) 100 UNIT/ML Solostar Pen [id number] Yes INJECT 0.3 TO 0.35 MLS(30 TO 35 UNITS) INTO THE SKIN EVERY DAY [name], MD Taking Active Self           Med Note Nanetta Batty Oct 17, 2022 10:33 AM)    isosorbide mononitrate (IMDUR) 30 MG 24 hr tablet [id number] Yes Take 1 tablet (30 mg total) by mouth daily. [name], MD Taking Active Self  metFORMIN (GLUCOPHAGE) 500 MG tablet [id number] Yes TAKE 2 TABLETS BY MOUTH TWICE DAILY WITH FOOD Para March,  Dwana Curd, MD Taking Active Self  Multiple Vitamin (MULTIVITAMIN WITH MINERALS) TABS tablet [id number] Yes Take 1 tablet by mouth in the morning. [provider] Taking Active Self  oxyCODONE-acetaminophen (PERCOCET) 5-325 MG tablet [id number] No Take 1 tablet by mouth every 6 (six) hours as needed for severe pain.  Patient not taking: Reported on 11/22/2022   Dara Lords, PA-C Not Taking Active   pantoprazole  (PROTONIX) 40 MG tablet [id number] Yes Take 1 tablet (40 mg total) by mouth daily. [name], MD Taking Active Self  prasugrel (EFFIENT) 10 MG TABS tablet [id number] Yes Take 1 tablet (10 mg total) by mouth daily. Alver Sorrow, NP Taking Active Self  silver sulfADIAZINE (SILVADENE) 1 % cream [id number] Yes Apply topically daily. Dara Lords, PA-C Taking Active   simvastatin (ZOCOR) 20 MG tablet [id number] Yes Take 1 tablet (20 mg total) by mouth at bedtime. Alver Sorrow, NP Taking Active Self  umeclidinium bromide (INCRUSE ELLIPTA) 62.5 MCG/ACT AEPB [id number] Yes Inhale 1 puff into the lungs daily. Kalman Shan, MD Taking Active Self  valsartan (DIOVAN) 40 MG tablet [id number] Yes Take 20 mg by mouth daily with supper. [provider] Taking Active Self            Home Care and Equipment/Supplies: Were Home Health Services Ordered?: Yes Name of Home Health Agency:: [facility] Has Agency set up a time to come to your home?: No Any new equipment or medical supplies ordered?: No  Functional Questionnaire: Do you need assistance with bathing/showering or dressing?: Yes Do you need assistance with meal preparation?: Yes Do you need assistance with eating?: No Do you have difficulty maintaining continence: No Do you need assistance with getting out of bed/getting out of a chair/moving?: No Do you have difficulty managing or taking your medications?: No  Follow up appointments reviewed: PCP Follow-up appointment confirmed?: NA Specialist Hospital Follow-up appointment confirmed?: Yes Date of Specialist follow-up appointment?: 12/17/22 Follow-Up Specialty Provider:: Dr Graciela Husbands Do you need transportation to your follow-up appointment?: No Do you understand care options if your condition(s) worsen?: Yes-patient verbalized understanding  SDOH Interventions Today    Flowsheet Row Most Recent Value  SDOH Interventions   Food Insecurity Interventions Intervention  Not Indicated  Housing Interventions Intervention Not Indicated  Transportation Interventions Intervention Not Indicated, Patient Resources (Friends/Family)      Interventions Today    Flowsheet Row Most Recent Value  General Interventions   General Interventions Discussed/Reviewed General Interventions Discussed, General Interventions Reviewed, Doctor Visits  [wound care]  Doctor Visits Discussed/Reviewed Doctor Visits Discussed, Doctor Visits Reviewed  Pharmacy Interventions   Pharmacy Dicussed/Reviewed Pharmacy Topics Discussed, Pharmacy Topics Reviewed      TOC Interventions Today    Flowsheet Row Most Recent Value  TOC Interventions   TOC Interventions Discussed/Reviewed TOC Interventions Discussed, TOC Interventions Reviewed        Gean Maidens BSN RN [facility] Care Management [phone]

## 2022-11-25 ENCOUNTER — Ambulatory Visit: Payer: Medicare Other | Attending: Internal Medicine

## 2022-11-25 ENCOUNTER — Telehealth: Payer: Self-pay | Admitting: *Deleted

## 2022-11-25 DIAGNOSIS — Z95810 Presence of automatic (implantable) cardiac defibrillator: Secondary | ICD-10-CM | POA: Diagnosis not present

## 2022-11-25 DIAGNOSIS — I5042 Chronic combined systolic (congestive) and diastolic (congestive) heart failure: Secondary | ICD-10-CM

## 2022-11-25 NOTE — Progress Notes (Signed)
  Care Coordination   Note   11/25/2022 Name: MALAKAI SCHOENHERR MRN: [id number] DOB: 11-28-1945  [name] is a 77 y.o. year old male who sees [name], MD for primary care. I reached out to [name] by phone today to offer care coordination services.  Mr. Tatsch was given information about Care Coordination services today including:   The Care Coordination services include support from the care team which includes your Nurse Coordinator, Clinical Social Worker, or Pharmacist.  The Care Coordination team is here to help remove barriers to the health concerns and goals most important to you. Care Coordination services are voluntary, and the patient may decline or stop services at any time by request to their care team member.   Care Coordination Consent Status: Patient agreed to services and verbal consent obtained.   Follow up plan:  Telephone appointment with care coordination team member scheduled for:  12/03/2022  Encounter Outcome:  Pt. Scheduled from referral   Burman Nieves, [facility] Care Coordination Care Guide Direct Dial: [phone]

## 2022-11-25 NOTE — Progress Notes (Signed)
EPIC Encounter for ICM Monitoring  Patient Name: Luke [name] is a 77 y.o. male Date: 11/25/2022 Primary Care Physican: [name], MD Primary Cardiologist: Anne Fu Electrophysiologist: Joycelyn Schmid Pacing: >99%          11/12/2022 Weight: 170 lbs (baseline 174 lbs)                                                            Spoke with patient and heart failure questions reviewed.  Transmission results reviewed.  Pt reports a little SOB.  Procedure for PAD on 7/16.  BP 116-119/57-58 feels dizzy when bending.  He does not have any dizziness or lightheadedness when walking.  He would like to decrease some of his BP meds and advised Dr Graciela Husbands will review and adjust meds if needed at 8/13 OV.     CorVue thoracic impedance suggesting possible fluid accumulation starting 7/21.    Prescribed:  Furosemide 40 mg take 1 tablet(s) (40 mg total) by mouth daily as needed for edema or fluid (weight 178 lbs or higher).   Labs: 11/20/2022 Creatinine 1.17, BUN 23, Potassium 3.9, Sodium 135, GFR >60 11/08/2022 Creatinine 1.40, BUN 31, Potassium 5.0, Sodium 137  11/01/2022 Creatinine 1.16, BUN 23, Potassium 3.8, Sodium 139, GFR >60  10/31/2022 Creatinine 1.33, BUN 23, Potassium 3.3, Sodium 137, GFR 54  10/30/2022 Creatinine 1.50, BUN 24, Potassium 4.4, Sodium 41, GFR >60 10/29/2022 Creatinine 1.15, BUN 24, Potassium 4.4, Sodium 141, GFR >60  10/23/2022 Creatinine 1.21, BUN 27, Potassium 4.7, Sodium 136, GFR >60  10/11/2022 Creatinine 1.15, BUN 25, Potassium 3.9, Sodium 138, GFR >60  10/05/2022 Creatinine 1.08, BUN 24, Potassium 4.2, Sodium 136, GFR >60  A complete set of results can be found in Results Review.   Recommendations: He will take a Lasix tomorrow x 1 day.   Copy sent to Dr Anne Fu as Lorain Childes     Follow-up plan: ICM clinic phone appointment on 12/02/2022 to recheck fluid levels.   91 day device clinic remote transmission 01/22/2023.     EP/Cardiology Office Visits: 12/17/2022 with Dr. Graciela Husbands   01/23/2023 with Dr Anne Fu.     Copy of ICM check sent to Dr. Graciela Husbands.   3 month ICM trend: 11/25/2022.    12-14 Month ICM trend:     Karie Soda, RN 11/25/2022 8:08 AM

## 2022-11-26 DIAGNOSIS — N189 Chronic kidney disease, unspecified: Secondary | ICD-10-CM | POA: Diagnosis not present

## 2022-11-26 DIAGNOSIS — Z7984 Long term (current) use of oral hypoglycemic drugs: Secondary | ICD-10-CM | POA: Diagnosis not present

## 2022-11-26 DIAGNOSIS — Z794 Long term (current) use of insulin: Secondary | ICD-10-CM | POA: Diagnosis not present

## 2022-11-26 DIAGNOSIS — E1122 Type 2 diabetes mellitus with diabetic chronic kidney disease: Secondary | ICD-10-CM | POA: Diagnosis not present

## 2022-11-26 DIAGNOSIS — I130 Hypertensive heart and chronic kidney disease with heart failure and stage 1 through stage 4 chronic kidney disease, or unspecified chronic kidney disease: Secondary | ICD-10-CM | POA: Diagnosis not present

## 2022-11-26 DIAGNOSIS — I5023 Acute on chronic systolic (congestive) heart failure: Secondary | ICD-10-CM | POA: Diagnosis not present

## 2022-11-27 ENCOUNTER — Other Ambulatory Visit (HOSPITAL_BASED_OUTPATIENT_CLINIC_OR_DEPARTMENT_OTHER): Payer: Self-pay | Admitting: Family

## 2022-11-27 ENCOUNTER — Other Ambulatory Visit: Payer: Self-pay | Admitting: Physician Assistant

## 2022-11-27 DIAGNOSIS — R21 Rash and other nonspecific skin eruption: Secondary | ICD-10-CM

## 2022-12-02 ENCOUNTER — Ambulatory Visit: Payer: Medicare Other | Attending: Internal Medicine

## 2022-12-02 ENCOUNTER — Encounter (HOSPITAL_BASED_OUTPATIENT_CLINIC_OR_DEPARTMENT_OTHER): Payer: Self-pay

## 2022-12-02 DIAGNOSIS — I5042 Chronic combined systolic (congestive) and diastolic (congestive) heart failure: Secondary | ICD-10-CM

## 2022-12-02 DIAGNOSIS — Z7982 Long term (current) use of aspirin: Secondary | ICD-10-CM | POA: Diagnosis not present

## 2022-12-02 DIAGNOSIS — Z7984 Long term (current) use of oral hypoglycemic drugs: Secondary | ICD-10-CM | POA: Diagnosis not present

## 2022-12-02 DIAGNOSIS — I5023 Acute on chronic systolic (congestive) heart failure: Secondary | ICD-10-CM | POA: Diagnosis not present

## 2022-12-02 DIAGNOSIS — Z95810 Presence of automatic (implantable) cardiac defibrillator: Secondary | ICD-10-CM | POA: Diagnosis not present

## 2022-12-02 DIAGNOSIS — E1165 Type 2 diabetes mellitus with hyperglycemia: Secondary | ICD-10-CM | POA: Diagnosis not present

## 2022-12-02 DIAGNOSIS — I739 Peripheral vascular disease, unspecified: Secondary | ICD-10-CM | POA: Diagnosis not present

## 2022-12-02 DIAGNOSIS — I25118 Atherosclerotic heart disease of native coronary artery with other forms of angina pectoris: Secondary | ICD-10-CM | POA: Diagnosis not present

## 2022-12-02 DIAGNOSIS — Z7901 Long term (current) use of anticoagulants: Secondary | ICD-10-CM | POA: Diagnosis not present

## 2022-12-02 DIAGNOSIS — J84112 Idiopathic pulmonary fibrosis: Secondary | ICD-10-CM | POA: Diagnosis not present

## 2022-12-02 DIAGNOSIS — Z48812 Encounter for surgical aftercare following surgery on the circulatory system: Secondary | ICD-10-CM | POA: Diagnosis not present

## 2022-12-02 DIAGNOSIS — E1122 Type 2 diabetes mellitus with diabetic chronic kidney disease: Secondary | ICD-10-CM | POA: Diagnosis not present

## 2022-12-02 DIAGNOSIS — E11621 Type 2 diabetes mellitus with foot ulcer: Secondary | ICD-10-CM | POA: Diagnosis not present

## 2022-12-02 DIAGNOSIS — Z9981 Dependence on supplemental oxygen: Secondary | ICD-10-CM | POA: Diagnosis not present

## 2022-12-02 DIAGNOSIS — I2510 Atherosclerotic heart disease of native coronary artery without angina pectoris: Secondary | ICD-10-CM | POA: Diagnosis not present

## 2022-12-02 DIAGNOSIS — L03115 Cellulitis of right lower limb: Secondary | ICD-10-CM | POA: Diagnosis not present

## 2022-12-02 DIAGNOSIS — L97519 Non-pressure chronic ulcer of other part of right foot with unspecified severity: Secondary | ICD-10-CM | POA: Diagnosis not present

## 2022-12-02 DIAGNOSIS — Z794 Long term (current) use of insulin: Secondary | ICD-10-CM | POA: Diagnosis not present

## 2022-12-02 DIAGNOSIS — N189 Chronic kidney disease, unspecified: Secondary | ICD-10-CM | POA: Diagnosis not present

## 2022-12-02 DIAGNOSIS — I4891 Unspecified atrial fibrillation: Secondary | ICD-10-CM | POA: Diagnosis not present

## 2022-12-02 DIAGNOSIS — I130 Hypertensive heart and chronic kidney disease with heart failure and stage 1 through stage 4 chronic kidney disease, or unspecified chronic kidney disease: Secondary | ICD-10-CM | POA: Diagnosis not present

## 2022-12-02 MED ORDER — PRASUGREL HCL 10 MG PO TABS
10.0000 mg | ORAL_TABLET | Freq: Every day | ORAL | 1 refills | Status: DC
Start: 2022-12-02 — End: 2023-06-24

## 2022-12-02 NOTE — Progress Notes (Signed)
EPIC Encounter for ICM Monitoring  Patient Name: Luke [name] is a 77 y.o. male Date: 12/02/2022 Primary Care Physican: [name], MD Primary Cardiologist: Anne Fu Electrophysiologist: Joycelyn Schmid Pacing: >99%          11/12/2022 Weight: 170 lbs (baseline 174 lbs)                                                            Spoke with patient and heart failure questions reviewed.  Transmission results reviewed.  Pt reports feeling okay.     CorVue thoracic impedance suggesting fluid levels returned to normal after taking PRN Furosemide x 1 day.    Prescribed:  Furosemide 40 mg take 1 tablet(s) (40 mg total) by mouth daily as needed for edema or fluid (weight 178 lbs or higher).   Labs: 11/20/2022 Creatinine 1.17, BUN 23, Potassium 3.9, Sodium 135, GFR >60 11/08/2022 Creatinine 1.40, BUN 31, Potassium 5.0, Sodium 137  11/01/2022 Creatinine 1.16, BUN 23, Potassium 3.8, Sodium 139, GFR >60  10/31/2022 Creatinine 1.33, BUN 23, Potassium 3.3, Sodium 137, GFR 54  10/30/2022 Creatinine 1.50, BUN 24, Potassium 4.4, Sodium 41, GFR >60 10/29/2022 Creatinine 1.15, BUN 24, Potassium 4.4, Sodium 141, GFR >60  10/23/2022 Creatinine 1.21, BUN 27, Potassium 4.7, Sodium 136, GFR >60  10/11/2022 Creatinine 1.15, BUN 25, Potassium 3.9, Sodium 138, GFR >60  10/05/2022 Creatinine 1.08, BUN 24, Potassium 4.2, Sodium 136, GFR >60  A complete set of results can be found in Results Review.   Recommendations:  No changes and encouraged to call if experiencing any fluid symptoms.   Follow-up plan: ICM clinic phone appointment on 12/30/2022.   91 day device clinic remote transmission 01/22/2023.     EP/Cardiology Office Visits: 12/17/2022 with Dr. Graciela Husbands  01/23/2023 with Dr Anne Fu.     Copy of ICM check sent to Dr. Graciela Husbands.   3 month ICM trend: 12/02/2022.    12-14 Month ICM trend:     Karie Soda, RN 12/02/2022 12:53 PM

## 2022-12-03 ENCOUNTER — Ambulatory Visit: Payer: Self-pay

## 2022-12-03 NOTE — Patient Outreach (Signed)
  Care Coordination   Initial Visit Note   12/03/2022 Name: Luke [name] MRN: [id number] DOB: Nov 24, 1945  Luke [name] is a 77 y.o. year old male who sees [name], MD for primary care. I spoke with  Luke [name] by phone today.  What matters to the patients health and wellness today?  Patient reports having recent bypass surgery to right leg.  Patient reports having knot at end of incision site.  He states the knot was present prior to discharge but has since gotten bigger.  Patient denies pain at site but states it's bothersome. He states incision site is healing well and closed.  Patient denies heart failure symptoms.  He states his current weight is 170.4 lbs.  Patient states he is walking on treadmill 10 min at a time to increase stamina.  Patient reports blood pressures readings:  111/58, 119/62.  He reports experiencing some lightheadedness when rising from seated or lying position.  Patient reports post op visit 12/12/22 and cardiology visit 01/2023.    Goals Addressed             This Visit's Progress    Continued improvement post surgery and management of health conditions       Interventions Today    Flowsheet Row Most Recent Value  Chronic Disease   Chronic disease during today's visit Congestive Heart Failure (CHF), Other  [right femoral endarterectomy / angioplasty]  General Interventions   General Interventions Discussed/Reviewed Doctor Visits, General Interventions Reviewed  [evaluation of current treatment plan for HF and status post femoral endarterectomy / angioplasty and patients adherence to plan as established by provider. Assessed for HF symptoms and incision site symptoms. Assessed current weight and BP readings.]  Doctor Visits Discussed/Reviewed Doctor Visits Reviewed  Annabell Sabal upcoming provider visits. Advised to report low blood pressure readings / lightheadedness to provider.]  Exercise Interventions   Exercise Discussed/Reviewed Physical  Activity  [assessed for current physical activity level.]  Education Interventions   Education Provided Provided Education  [reviewed signs/ symptoms of infection. Assessed for infection symptoms. Advised to call surgeon's office to report knot at incision site. Reviewed and assessed for HF symptoms.l Advised to continue monitoring BP and recording.]  Pharmacy Interventions   Pharmacy Dicussed/Reviewed Pharmacy Topics Reviewed              SDOH assessments and interventions completed:  Yes  SDOH Interventions Today    Flowsheet Row Most Recent Value  SDOH Interventions   Food Insecurity Interventions Intervention Not Indicated  Housing Interventions Intervention Not Indicated  Transportation Interventions Intervention Not Indicated        Care Coordination Interventions:  Yes, provided   Follow up plan: Follow up call scheduled for 12/20/22    Encounter Outcome:  Pt. Visit Completed   George Ina RN,BSN,CCM [facility] Care Coordination [phone] direct line

## 2022-12-03 NOTE — Patient Instructions (Signed)
Visit Information  Thank you for taking time to visit with me today. Please don't hesitate to contact me if I can be of assistance to you.   Following are the goals we discussed today:   Goals Addressed             This Visit's Progress    Continued improvement post surgery and management of health conditions       Interventions Today    Flowsheet Row Most Recent Value  Chronic Disease   Chronic disease during today's visit Congestive Heart Failure (CHF), Other  [right femoral endarterectomy / angioplasty]  General Interventions   General Interventions Discussed/Reviewed Doctor Visits, General Interventions Reviewed  [evaluation of current treatment plan for HF and status post femoral endarterectomy / angioplasty and patients adherence to plan as established by provider. Assessed for HF symptoms and incision site symptoms. Assessed current weight and BP readings.]  Doctor Visits Discussed/Reviewed Doctor Visits Reviewed  Annabell Sabal upcoming provider visits. Advised to report low blood pressure readings / lightheadedness to provider.]  Exercise Interventions   Exercise Discussed/Reviewed Physical Activity  [assessed for current physical activity level.]  Education Interventions   Education Provided Provided Education  [reviewed signs/ symptoms of infection. Assessed for infection symptoms. Advised to call surgeon's office to report knot at incision site. Reviewed and assessed for HF symptoms.l Advised to continue monitoring BP and recording.]  Pharmacy Interventions   Pharmacy Dicussed/Reviewed Pharmacy Topics Reviewed              Our next appointment is by telephone on 12/20/22 at 2:30 pm  Please call the care guide team at [phone] if you need to cancel or reschedule your appointment.   If you are experiencing a Mental Health or Behavioral Health Crisis or need someone to talk to, please call the Suicide and Crisis Lifeline: 988 call 1-800-273-TALK (toll free, 24 hour  hotline)  Patient verbalizes understanding of instructions and care plan provided today and agrees to view in MyChart. Active MyChart status and patient understanding of how to access instructions and care plan via MyChart confirmed with patient.     George Ina RN,BSN,CCM [facility] Care Coordination [phone] direct line

## 2022-12-05 ENCOUNTER — Other Ambulatory Visit: Payer: Self-pay | Admitting: Vascular Surgery

## 2022-12-10 ENCOUNTER — Encounter (HOSPITAL_BASED_OUTPATIENT_CLINIC_OR_DEPARTMENT_OTHER): Payer: Self-pay

## 2022-12-10 MED ORDER — FUROSEMIDE 40 MG PO TABS: 40.0000 mg | ORAL_TABLET | Freq: Every day | ORAL | 3 refills | Status: DC

## 2022-12-10 NOTE — Telephone Encounter (Signed)
Per HF clinic last note 11/12/22 'Continue Lasix 40 mg daily'. Okay to provide refill. No updated labs needed at this time.   Alver Sorrow, NP

## 2022-12-10 NOTE — Telephone Encounter (Signed)
Okay to refill as daily? Need update labs?

## 2022-12-12 ENCOUNTER — Ambulatory Visit (INDEPENDENT_AMBULATORY_CARE_PROVIDER_SITE_OTHER): Payer: Medicare Other | Admitting: Physician Assistant

## 2022-12-12 VITALS — BP 156/69 | HR 74 | Temp 98.4°F | Resp 18 | Ht 74.0 in | Wt 177.5 lb

## 2022-12-12 DIAGNOSIS — I70261 Atherosclerosis of native arteries of extremities with gangrene, right leg: Secondary | ICD-10-CM

## 2022-12-12 NOTE — Progress Notes (Signed)
POST OPERATIVE OFFICE NOTE    CC:  F/u for surgery  HPI:  This is a 77 y.o. male who is s/p right iliofemoral endarterectomy with vein patch angioplasty due to critical limb ischemia with tissue loss of the right lower extremity performed by Dr. Randie Heinz on 11/19/2022.  Patient states the tissue loss on his leg has significantly improved since surgery.  He believes his right groin incision is healing well.  He is on aspirin and statin daily.  He denies any rest pain.  Allergies  Allergen Reactions   Aldactone [Spironolactone] Other (See Comments)    Hyperkalemia   Brilinta [Ticagrelor] Shortness Of Breath    Numbness in hands and feet Blurry vision   Clopidogrel Rash    Redness and Itchiness   Codeine Rash and Hives   Amoxicillin Hives   Atorvastatin     Myalgias with lipitor.  Does tolerate simvastatin.     Januvia [Sitagliptin] Other (See Comments)    Diarrhea and heart racing   Jardiance [Empagliflozin] Other (See Comments)    Polyuria; excessive weight loss   Lisinopril     Possible cause of pancreatitis   Rosuvastatin Other (See Comments)    myalgia    Current Outpatient Medications  Medication Sig Dispense Refill   albuterol (VENTOLIN HFA) 108 (90 Base) MCG/ACT inhaler Inhale 2 puffs into the lungs every 6 (six) hours as needed for wheezing or shortness of breath. 8 g 6   amLODipine (NORVASC) 10 MG tablet TAKE 1 TABLET(10 MG) BY MOUTH DAILY 90 tablet 3   aspirin EC 81 MG tablet Take 1 tablet (81 mg total) by mouth daily. 120 tablet 2   carvedilol (COREG) 25 MG tablet TAKE 1 TABLET BY MOUTH TWICE DAILY WITH MEALS 180 tablet 3   cetirizine (ZYRTEC) 10 MG tablet TAKE 1 TABLET(10 MG) BY MOUTH DAILY 30 tablet 0   Cholecalciferol (VITAMIN D3) 50 MCG (2000 UT) TABS Take 2,000 Units by mouth in the morning.     Cyanocobalamin (VITAMIN B-12 PO) Take 2,000 mcg by mouth in the morning.     furosemide (LASIX) 40 MG tablet Take 1 tablet (40 mg total) by mouth daily. 90 tablet 3    glipiZIDE (GLUCOTROL) 5 MG tablet Take 1 tablet (5 mg total) by mouth 2 (two) times daily before a meal.     glucose blood (ACCU-CHEK AVIVA PLUS) test strip AS DIRECTED TO CHECK BLOOD SUGAR TWICE DAILY 200 strip 3   insulin glargine (LANTUS SOLOSTAR) 100 UNIT/ML Solostar Pen INJECT 0.3 TO 0.35 MLS(30 TO 35 UNITS) INTO THE SKIN EVERY DAY 15 mL 3   isosorbide mononitrate (IMDUR) 30 MG 24 hr tablet Take 1 tablet (30 mg total) by mouth daily. 180 tablet 3   metFORMIN (GLUCOPHAGE) 500 MG tablet TAKE 2 TABLETS BY MOUTH TWICE DAILY WITH FOOD 360 tablet 3   Multiple Vitamin (MULTIVITAMIN WITH MINERALS) TABS tablet Take 1 tablet by mouth in the morning.     pantoprazole (PROTONIX) 40 MG tablet Take 1 tablet (40 mg total) by mouth daily.     prasugrel (EFFIENT) 10 MG TABS tablet Take 1 tablet (10 mg total) by mouth daily. 90 tablet 1   silver sulfADIAZINE (SILVADENE) 1 % cream APPLY TOPICALLY TO THE AFFECTED AREA DAILY 50 g 1   simvastatin (ZOCOR) 20 MG tablet Take 1 tablet (20 mg total) by mouth at bedtime. 90 tablet 3   umeclidinium bromide (INCRUSE ELLIPTA) 62.5 MCG/ACT AEPB Inhale 1 puff into the lungs daily. 90 each  3   valsartan (DIOVAN) 40 MG tablet Take 20 mg by mouth daily with supper.     doxycycline (VIBRAMYCIN) 100 MG capsule Take 1 capsule (100 mg total) by mouth 2 (two) times daily. (Patient not taking: Reported on 12/03/2022) 28 capsule 0   oxyCODONE-acetaminophen (PERCOCET) 5-325 MG tablet Take 1 tablet by mouth every 6 (six) hours as needed for severe pain. (Patient not taking: Reported on 11/22/2022) 8 tablet 0   No current facility-administered medications for this visit.     ROS:  See HPI  Physical Exam:  Vitals:   12/12/22 0927  BP: (!) 156/69  Pulse: 74  Resp: 18  Temp: 98.4 F (36.9 C)  TempSrc: Temporal  SpO2: 93%  Weight: 177 lb 8 oz (80.5 kg)  Height: 6\' 2"  (1.88 m)    Incision: Right groin incision well-healed Extremities: Brisk PT and DP into the distal  foot Neuro: A&O  Assessment/Plan:  This is a 77 y.o. male who is s/p: Extensive right iliofemoral endarterectomy with vein patch angioplasty  -Right foot is well-perfused with brisk DP signal into the distal foot as well as a brisk PT signal by Doppler.  Tissue loss has significantly improved compared to before surgery.  He still has some cracking skin especially around the right ankle however this has drastically improved.  We will continue to follow tissue loss somewhat closely and check an ABI in 1 month.  Per Dr. Pascal Lux if he is unable to heal his current wounds he would consider antegrade and retrograde endovascular approach versus bypass to the below the knee popliteal with PTFE.   Emilie Rutter, PA-C [facility] [phone]  Clinic MD:  Edilia Bo

## 2022-12-13 ENCOUNTER — Telehealth: Payer: Self-pay

## 2022-12-13 NOTE — Telephone Encounter (Signed)
Received fax from [place] stating that pt has not had any service requests in the last 6 months and was inquiring whether pt is still on therapy. Reached out and spoke to pt who informed me that, since his last appt with MR, he has had several procedures done and his cardiologist had discussed with MR about stopping Esbriet for now.  Pt states that he needs to have another PFT completed to evaluate his current status, however does not currently have an appointment scheduled. Offered to send a message to the scheduling team to request they contact him, which he stated that he would appreciate.

## 2022-12-17 ENCOUNTER — Encounter: Payer: Self-pay | Admitting: Internal Medicine

## 2022-12-17 ENCOUNTER — Ambulatory Visit: Payer: Medicare Other | Admitting: Internal Medicine

## 2022-12-17 VITALS — HR 66 | Ht 74.0 in | Wt 177.0 lb

## 2022-12-17 DIAGNOSIS — I442 Atrioventricular block, complete: Secondary | ICD-10-CM | POA: Diagnosis not present

## 2022-12-17 DIAGNOSIS — Z95810 Presence of automatic (implantable) cardiac defibrillator: Secondary | ICD-10-CM | POA: Diagnosis not present

## 2022-12-17 DIAGNOSIS — I447 Left bundle-branch block, unspecified: Secondary | ICD-10-CM | POA: Insufficient documentation

## 2022-12-17 MED ORDER — AMLODIPINE BESYLATE 5 MG PO TABS: 5.0000 mg | ORAL_TABLET | Freq: Every day | ORAL | 3 refills | Status: DC

## 2022-12-17 NOTE — Progress Notes (Signed)
Patient Care Team: [name], MD as PCP - General Jake Bathe, MD as PCP - Cardiology (Cardiology) Duke Salvia, MD as PCP - Electrophysiology (Cardiology) [name], MD (Vascular Surgery) Willis Modena, MD (Gastroenterology) Karie Soda, MD (General Surgery) Arman Bogus, MD (Neurosurgery) Shea Evans [facility]) Jake Bathe, MD (Cardiology) Kathyrn Sheriff, [facility] (Inactive) as Pharmacist (Pharmacist) Otho Ket, RN as [facility] Care Management   HPI  Luke [name] is a 77 y.o. male Seen in followup for an CRT-  ICD St Jude  implanted for intermittent complete heart block in the setting of ischemic cardiomyopathy and left bundle branch block.  GEN change 6/22  Some diaphragmatic stimulation prior to GEN change, not associated with macro dislodgment.   The patient denies chest pain, nocturnal dyspnea, orthopnea.  There have been no palpitations, lightheadedness or syncope.  Complains of dyspnea on exertion which has been much more prominent over the last 6 months.  Prior to his revascularization (see Below 5-6/24) exertional chest discomfort which has been relieved. Also has had issues with right lower extremity edema and underwent right lower extremity revascularization 6/24 with modest relief. [place] has been elevated and diuretics were increased.  This is back to normal.  He shortness of breath persists. Marland Kitchen    DATE TEST EF   6/13 LHC  [id number]; RCA diff 50-60%  3/14 Echo   30-35 %   3/19 Echo  55-65%   3/23 Echo  40-45 %   3/23  LHC   LADp 80% RCA T with L>>R collaterals  5/24 LHC  LADp/m 80>>stent>>0   6/24 LHC    6/24 Echo  60-65% RCAd 99>>stent>>0 RCAp 75>>stent>>0   RCAm 75>>stent>>0     Date Cr K Hgb  10/20 1.17 4.5 14.3   10/21 1.24 4.9 14.3  6/22 1.17 4.8 15.3  5/23 1.18 5.0 13.3<16.9  7/24 1.17 3.9 10.7 (post op)       Past Medical History:  Diagnosis Date   AAA (abdominal  aortic aneurysm) (HCC) 2011   Per vascular surgery   Atrial fibrillation (HCC)    CAD (coronary artery disease)    Presumed CAD with nuclear scan October 09, 2011,  large anteroseptal MI and inferior MI. Catheterization scheduled October 15, 2011   Cardiomyopathy [facility])    Nuclear, October 09, 2011, EF 30%, multiple focal wall motion abnormalities   Chronic kidney disease    CKD3   COVID-19    2021   Diabetes mellitus    type II   GERD (gastroesophageal reflux disease)    Silent   HLD (hyperlipidemia)    Hypertension    white coat HTN-- often elevated in office and controlled on outside checks.   ICD (implantable cardioverter-defibrillator) in place    CRT-D placed March, 2014 complete heart block and k dysfunction   IPF (idiopathic pulmonary fibrosis) (HCC)    LBBB (left bundle branch block)    LBBB on EKG October 11, 2011,  no prior EKG has been done   Low testosterone    Hx of   On home oxygen therapy    2L as needed   Pacemaker    PAD (peripheral artery disease) (HCC)    Pancreatitis    Umbilical hernia     Past Surgical History:  Procedure Laterality Date   ABDOMINAL AORTAGRAM N/A 10/28/2011   Procedure: ABDOMINAL Ronny Flurry;  Surgeon: [name], MD;  Location: [facility] CATH LAB;  Service: Cardiovascular;  Laterality: N/A;   ABDOMINAL AORTIC ANEURYSM REPAIR     EVAR    ABDOMINAL AORTOGRAM N/A 05/27/2022   Procedure: ABDOMINAL AORTOGRAM;  Surgeon: Maeola Harman, MD;  Location: [facility] INVASIVE CV LAB;  Service: Cardiovascular;  Laterality: N/A;   BI-VENTRICULAR IMPLANTABLE CARDIOVERTER DEFIBRILLATOR N/A 07/16/2012   Procedure: BI-VENTRICULAR IMPLANTABLE CARDIOVERTER DEFIBRILLATOR  (CRT-D);  Surgeon: Duke Salvia, MD;  Location: [facility] CATH LAB;  Service: Cardiovascular;  Laterality: N/A;   BIV ICD GENERATOR CHANGEOUT N/A 10/25/2020   Procedure: BIV ICD GENERATOR CHANGEOUT;  Surgeon: Duke Salvia, MD;  Location: [facility] INVASIVE CV LAB;  Service: Cardiovascular;  Laterality:  N/A;   CARDIAC CATHETERIZATION     CATARACT EXTRACTION Left 2023   COLONOSCOPY  03/23/2018; 2020   CORONARY CTO INTERVENTION N/A 10/10/2022   Procedure: CORONARY CTO INTERVENTION;  Surgeon: Corky Crafts, MD;  Location: [facility] INVASIVE CV LAB;  Service: Cardiovascular;  Laterality: N/A;   CORONARY LITHOTRIPSY N/A 09/18/2022   Procedure: CORONARY LITHOTRIPSY;  Surgeon: Corky Crafts, MD;  Location: [facility] INVASIVE CV LAB;  Service: Cardiovascular;  Laterality: N/A;   CORONARY STENT INTERVENTION N/A 09/18/2022   Procedure: CORONARY STENT INTERVENTION;  Surgeon: Corky Crafts, MD;  Location: [facility] INVASIVE CV LAB;  Service: Cardiovascular;  Laterality: N/A;   CORONARY ULTRASOUND/IVUS N/A 09/18/2022   Procedure: Coronary Ultrasound/IVUS;  Surgeon: Corky Crafts, MD;  Location: [facility] INVASIVE CV LAB;  Service: Cardiovascular;  Laterality: N/A;   ENDARTERECTOMY FEMORAL Right 11/19/2022   Procedure: RIGHT COMMON FEMORAL ENDARTERECTOMY WITH VEIN PATCH ANGIOPLASTY;  Surgeon: Maeola Harman, MD;  Location: [facility] OR;  Service: Vascular;  Laterality: Right;   ESOPHAGOGASTRODUODENOSCOPY N/A 02/12/2019   Procedure: ESOPHAGOGASTRODUODENOSCOPY (EGD);  Surgeon: Lynann Bologna, MD;  Location: [facility] ENDOSCOPY;  Service: Endoscopy;  Laterality: N/A;   ESOPHAGOGASTRODUODENOSCOPY (EGD) WITH PROPOFOL N/A 02/05/2018   Procedure: ESOPHAGOGASTRODUODENOSCOPY (EGD) WITH PROPOFOL;  Surgeon: Napoleon Form, MD;  Location: [facility] ENDOSCOPY;  Service: Endoscopy;  Laterality: N/A;   ESOPHAGOGASTRODUODENOSCOPY (EGD) WITH PROPOFOL N/A 09/27/2018   Procedure: ESOPHAGOGASTRODUODENOSCOPY (EGD) WITH PROPOFOL;  Surgeon: Jeani Hawking, MD;  Location: [facility] ENDOSCOPY;  Service: Endoscopy;  Laterality: N/A;   FOREIGN BODY REMOVAL  09/27/2018   Procedure: FOREIGN BODY REMOVAL;  Surgeon: Jeani Hawking, MD;  Location: [facility] ENDOSCOPY;  Service: Endoscopy;;   FOREIGN BODY REMOVAL  02/12/2019   Procedure: FOREIGN BODY REMOVAL;   Surgeon: Lynann Bologna, MD;  Location: [facility] ENDOSCOPY;  Service: Endoscopy;;   HERNIA REPAIR  05/14/2013   INSERTION OF MESH N/A 05/14/2013   Procedure: INSERTION OF MESH;  Surgeon: Ardeth Sportsman, MD;  Location: [facility] OR;  Service: General;  Laterality: N/A;   LEFT HEART CATH N/A 10/10/2022   Procedure: Left Heart Cath;  Surgeon: Corky Crafts, MD;  Location: [facility] INVASIVE CV LAB;  Service: Cardiovascular;  Laterality: N/A;   LEFT HEART CATH AND CORONARY ANGIOGRAPHY N/A 07/17/2021   Procedure: LEFT HEART CATH AND CORONARY ANGIOGRAPHY;  Surgeon: Lennette Bihari, MD;  Location: [facility] INVASIVE CV LAB;  Service: Cardiovascular;  Laterality: N/A;   LEFT HEART CATH AND CORONARY ANGIOGRAPHY N/A 09/18/2022   Procedure: LEFT HEART CATH AND CORONARY ANGIOGRAPHY;  Surgeon: Corky Crafts, MD;  Location: [facility] INVASIVE CV LAB;  Service: Cardiovascular;  Laterality: N/A;   LOWER EXTREMITY ANGIOGRAM Bilateral 10/28/2011   Procedure: LOWER EXTREMITY ANGIOGRAM;  Surgeon: [name], MD;  Location: [facility] CATH LAB;  Service: Cardiovascular;  Laterality: Bilateral;   LOWER EXTREMITY ANGIOGRAM Right 11/19/2022  Procedure: RIGHT LOWER EXTREMITY ANGIOGRAM;  Surgeon: Maeola Harman, MD;  Location: [facility] OR;  Service: Vascular;  Laterality: Right;   LOWER EXTREMITY ANGIOGRAPHY  05/27/2022   Procedure: Lower Extremity Angiography;  Surgeon: Maeola Harman, MD;  Location: [facility] INVASIVE CV LAB;  Service: Cardiovascular;;   PACEMAKER INSERTION  07/16/2012   pacemaker/defibrilator   PATCH ANGIOPLASTY Right 11/19/2022   Procedure: VEIN PATCH ANGIOPLASTY;  Surgeon: Maeola Harman, MD;  Location: [facility] OR;  Service: Vascular;  Laterality: Right;   POLYPECTOMY     POSTERIOR CERVICAL FUSION/FORAMINOTOMY N/A 06/09/2014   Procedure: Laminectomy - Cervical two-Cervcial four posterior cervical instrumented fusion Cervical two-cervical four;  Surgeon: Tia Alert, MD;  Location: [facility] NEURO ORS;   Service: Neurosurgery;  Laterality: N/A;  posterior    SAVORY DILATION N/A 02/05/2018   Procedure: SAVORY DILATION;  Surgeon: Napoleon Form, MD;  Location: [facility] ENDOSCOPY;  Service: Endoscopy;  Laterality: N/A;   TONSILLECTOMY     as a child    UMBILICAL HERNIA REPAIR N/A 05/14/2013   Procedure: LAPAROSCOPIC exploration and repair of hernia in abdominal ;  Surgeon: Ardeth Sportsman, MD;  Location: [facility] OR;  Service: General;  Laterality: N/A;   UPPER GASTROINTESTINAL ENDOSCOPY  2020    Current Outpatient Medications  Medication Sig Dispense Refill   albuterol (VENTOLIN HFA) 108 (90 Base) MCG/ACT inhaler Inhale 2 puffs into the lungs every 6 (six) hours as needed for wheezing or shortness of breath. 8 g 6   amLODipine (NORVASC) 5 MG tablet Take 1 tablet (5 mg total) by mouth daily. 180 tablet 3   aspirin EC 81 MG tablet Take 1 tablet (81 mg total) by mouth daily. 120 tablet 2   carvedilol (COREG) 25 MG tablet TAKE 1 TABLET BY MOUTH TWICE DAILY WITH MEALS 180 tablet 3   cetirizine (ZYRTEC) 10 MG tablet TAKE 1 TABLET(10 MG) BY MOUTH DAILY 30 tablet 0   Cholecalciferol (VITAMIN D3) 50 MCG (2000 UT) TABS Take 2,000 Units by mouth in the morning.     Cyanocobalamin (VITAMIN B-12 PO) Take 2,000 mcg by mouth in the morning.     furosemide (LASIX) 40 MG tablet Take 1 tablet (40 mg total) by mouth daily. 90 tablet 3   glipiZIDE (GLUCOTROL) 5 MG tablet Take 1 tablet (5 mg total) by mouth 2 (two) times daily before a meal.     glucose blood (ACCU-CHEK AVIVA PLUS) test strip AS DIRECTED TO CHECK BLOOD SUGAR TWICE DAILY 200 strip 3   insulin glargine (LANTUS SOLOSTAR) 100 UNIT/ML Solostar Pen INJECT 0.3 TO 0.35 MLS(30 TO 35 UNITS) INTO THE SKIN EVERY DAY 15 mL 3   isosorbide mononitrate (IMDUR) 30 MG 24 hr tablet Take 1 tablet (30 mg total) by mouth daily. 180 tablet 3   metFORMIN (GLUCOPHAGE) 500 MG tablet TAKE 2 TABLETS BY MOUTH TWICE DAILY WITH FOOD 360 tablet 3   Multiple Vitamin (MULTIVITAMIN WITH  MINERALS) TABS tablet Take 1 tablet by mouth in the morning.     pantoprazole (PROTONIX) 40 MG tablet Take 1 tablet (40 mg total) by mouth daily.     prasugrel (EFFIENT) 10 MG TABS tablet Take 1 tablet (10 mg total) by mouth daily. 90 tablet 1   silver sulfADIAZINE (SILVADENE) 1 % cream APPLY TOPICALLY TO THE AFFECTED AREA DAILY 50 g 1   simvastatin (ZOCOR) 20 MG tablet Take 1 tablet (20 mg total) by mouth at bedtime. 90 tablet 3   umeclidinium bromide (INCRUSE ELLIPTA) 62.5 MCG/ACT  AEPB Inhale 1 puff into the lungs daily. 90 each 3   valsartan (DIOVAN) 40 MG tablet Take 20 mg by mouth daily with supper.     No current facility-administered medications for this visit.    Allergies  Allergen Reactions   Aldactone [Spironolactone] Other (See Comments)    Hyperkalemia   Brilinta [Ticagrelor] Shortness Of Breath    Numbness in hands and feet Blurry vision   Clopidogrel Rash    Redness and Itchiness   Codeine Rash and Hives   Amoxicillin Hives   Atorvastatin     Myalgias with lipitor.  Does tolerate simvastatin.     Januvia [Sitagliptin] Other (See Comments)    Diarrhea and heart racing   Jardiance [Empagliflozin] Other (See Comments)    Polyuria; excessive weight loss   Lisinopril     Possible cause of pancreatitis   Rosuvastatin Other (See Comments)    myalgia    Review of Systems negative except from HPI and PMH  Physical Exam Pulse 66   Ht 6\' 2"  (1.88 m)   Wt 177 lb (80.3 kg)   BMI 22.73 kg/m  Well developed and well nourished in no acute distress HENT normal Neck supple with JVP-flat Clear Device pocket well healed; without hematoma or erythema.  There is no tethering  Regular rate and rhythm, no  gallop No  murmur Abd-soft with active BS No Clubbing cyanosis 1+ R>L  edema Skin-warm and dry A & Oriented  Grossly normal sensory and motor function  ECG P synchronous pacing with a positive QRS lead V1 and negative QRS lead I and a QRSd of 160 ms  Device function is  normal. Programming changes none   See Paceart for details    Assessment Plan  Ischemic Cardiomyopathy interval normalization  Chronic HF diastolic  Implantable Defibrillator CRT  St Jude  .     Complete heart Block   Hyperlipidemia  Diabetes  Hypertension   Interval normalization of left ventricular function with CRT.  Will continue beta-blockers and valsartan  Euvolemic.  Right lower extremity swelling is asymmetric, not sure its fluid as he has no jugular venous distention not withstanding his significant lung disease.  Dyspnea remains a concern however, his lung exam was somewhat asymmetric he was disinclined towards an x-ray as he supposed to see Dr. Marchelle Gearing in a couple of weeks with repeat CT scan prior to the visit.  Blood pressure is well-controlled.  He would like to come off of some of his medications, we will decrease his amlodipine from 10--5 and when he sees Dr. Champ Mungo in a couple of weeks if blood pressure remains well-controlled, and there is no further angina, perhaps his isosorbide can also be discontinued.  He might also tolerate a lower dose of the SGLT2 as a 25 is associated with excessive polyuria

## 2022-12-17 NOTE — Patient Instructions (Addendum)
Medication Instructions:  Your physician has recommended you make the following change in your medication:  1) DECREASE Amlodipine to 5 mg daily   *If you need a refill on your cardiac medications before your next appointment, please call your pharmacy*  Follow-Up: At [facility], you and your health needs are our priority.  As part of our continuing mission to provide you with exceptional heart care, we have created designated Provider Care Teams.  These Care Teams include your primary Cardiologist (physician) and Advanced Practice Providers (APPs -  Physician Assistants and Nurse Practitioners) who all work together to provide you with the care you need, when you need it.  Your next appointment:   1 year  Provider:   You may see Sherryl Manges, MD or one of the following Advanced Practice Providers on your designated Care Team:   Francis Dowse, [place] [address] [place], [place] Sherie Don, NP Canary Brim, NP

## 2022-12-18 ENCOUNTER — Telehealth (HOSPITAL_COMMUNITY): Payer: Self-pay

## 2022-12-18 DIAGNOSIS — H2511 Age-related nuclear cataract, right eye: Secondary | ICD-10-CM | POA: Diagnosis not present

## 2022-12-18 DIAGNOSIS — H43811 Vitreous degeneration, right eye: Secondary | ICD-10-CM | POA: Diagnosis not present

## 2022-12-18 DIAGNOSIS — E113591 Type 2 diabetes mellitus with proliferative diabetic retinopathy without macular edema, right eye: Secondary | ICD-10-CM | POA: Diagnosis not present

## 2022-12-18 DIAGNOSIS — H35373 Puckering of macula, bilateral: Secondary | ICD-10-CM | POA: Diagnosis not present

## 2022-12-18 NOTE — Telephone Encounter (Signed)
Called pt to schedule orientation for cardiac rehab. Pt stated he wants to wait till after september 10 th due to seeing a pulmonologist on September 5 at 1:30!

## 2022-12-19 ENCOUNTER — Other Ambulatory Visit: Payer: Self-pay

## 2022-12-19 DIAGNOSIS — I739 Peripheral vascular disease, unspecified: Secondary | ICD-10-CM

## 2022-12-20 ENCOUNTER — Ambulatory Visit: Payer: Self-pay

## 2022-12-20 ENCOUNTER — Other Ambulatory Visit (HOSPITAL_COMMUNITY): Payer: Self-pay

## 2022-12-20 NOTE — Patient Instructions (Signed)
Visit Information  Thank you for taking time to visit with me today. Please don't hesitate to contact me if I can be of assistance to you.   Following are the goals we discussed today:   Goals Addressed             This Visit's Progress    Continued improvement post surgery and management of health conditions       Interventions Today    Flowsheet Row Most Recent Value  Chronic Disease   Chronic disease during today's visit Congestive Heart Failure (CHF), Other  [right femoral endarterectomy / angioplasty)]  General Interventions   General Interventions Discussed/Reviewed General Interventions Reviewed, Doctor Visits  [evaluation of current treatment plan and patients adherence to plan as established by provider. Assessed for surgical site pain, new signs or symptoms and Heart failure symptoms.  Assessed current weight.]  Doctor Visits Discussed/Reviewed Doctor Visits Reviewed  Annabell Sabal upcoming provider visits.  Advised to keep follow up visits with provider. Advised to notify provider or any worsening symptoms.]  Exercise Interventions   Exercise Discussed/Reviewed Physical Activity  [Assessed patients physical activity.  Encouraged to continue bicycle/ treadmill exercise as tolerated.]  Physical Activity Discussed/Reviewed Physical Activity Reviewed  Education Interventions   Education Provided Provided Education  [reviewed signs/ symptoms of infection.  Advised to continue wrapping right leg as instructed by provider and elevate leg when sitting or lying down. Discussed importance of monitoring oxygen level and wearing oxygen if O2 sat below 90.]  Pharmacy Interventions   Pharmacy Dicussed/Reviewed Pharmacy Topics Reviewed  [Discussed medication adjustments.  Advised to take all recommended medications as prescribed.]              Our next appointment is by telephone on 01/27/23 at 2:00 pm  Please call the care guide team at [phone] if you need to cancel or reschedule  your appointment.   If you are experiencing a Mental Health or Behavioral Health Crisis or need someone to talk to, please call the Suicide and Crisis Lifeline: 988 call 1-800-273-TALK (toll free, 24 hour hotline)  Patient verbalizes understanding of instructions and care plan provided today and agrees to view in MyChart. Active MyChart status and patient understanding of how to access instructions and care plan via MyChart confirmed with patient.     George Ina RN,BSN,CCM [facility] Care Coordination [phone] direct line

## 2022-12-20 NOTE — Patient Outreach (Signed)
  Care Coordination   Follow Up Visit Note   12/20/2022 Name: [name] MRN: [id number] DOB: July 03, 1945  Luke [name] is a 77 y.o. year old male who sees Luke Nam, MD for primary care. I spoke with  Luke [name] by phone today.  What matters to the patients health and wellness today?  Patient states knot at end of his incision site had gotten better and decrease in size.  Patient states he was able to see his surgeon's PA to have knot assessed.  Patient states he continues to wrap his right leg due to the edema.  He states the wrapping is helping his leg quite a bit.  Patient states he continues to have mild SOB with activity. He states he is scheduled to see the pulmonologist on 01/09/23.  He states he wears his oxygen at 2 L as needed.  He reports checking his oxygen level frequently.  States oxygen level is usually 93/94. He states with activity oxygen level has gotten down to 88.  Patient reports he is scheduled to see vascular surgeon on 01/23/23.     Goals Addressed             This Visit's Progress    Continued improvement post surgery and management of health conditions       Interventions Today    Flowsheet Row Most Recent Value  Chronic Disease   Chronic disease during today's visit Congestive Heart Failure (CHF), Other  [right femoral endarterectomy / angioplasty)]  General Interventions   General Interventions Discussed/Reviewed General Interventions Reviewed, Doctor Visits  [evaluation of current treatment plan and patients adherence to plan as established by provider. Assessed for surgical site pain, new signs or symptoms and Heart failure symptoms.  Assessed current weight.]  Doctor Visits Discussed/Reviewed Doctor Visits Reviewed  Luke [name] upcoming provider visits.  Advised to keep follow up visits with provider. Advised to notify provider or any worsening symptoms.]  Exercise Interventions   Exercise Discussed/Reviewed Physical Activity  [Assessed  patients physical activity.  Encouraged to continue bicycle/ treadmill exercise as tolerated.]  Physical Activity Discussed/Reviewed Physical Activity Reviewed  Education Interventions   Education Provided Provided Education  [reviewed signs/ symptoms of infection.  Advised to continue wrapping right leg as instructed by provider and elevate leg when sitting or lying down. Discussed importance of monitoring oxygen level and wearing oxygen if O2 sat below 90.]  Pharmacy Interventions   Pharmacy Dicussed/Reviewed Pharmacy Topics Reviewed  [Discussed medication adjustments.  Advised to take all recommended medications as prescribed.]              SDOH assessments and interventions completed:  No     Care Coordination Interventions:  Yes, provided   Follow up plan: Follow up call scheduled for 01/27/23    Encounter Outcome:  Pt. Visit Completed   George Ina RN,BSN,CCM [facility] Care Coordination [phone] direct line

## 2022-12-26 MED ORDER — VALSARTAN 40 MG PO TABS: 20.0000 mg | ORAL_TABLET | Freq: Every day | ORAL | 1 refills | Status: DC

## 2022-12-26 NOTE — Addendum Note (Signed)
Addended by: Alois Cliche on: 12/26/2022 04:40 PM   Modules accepted: Orders

## 2022-12-26 NOTE — Telephone Encounter (Signed)
Patient last seen 8/13, no mention of increasing valsartan back to 40mg  , ok to refill at higher dose?

## 2022-12-27 ENCOUNTER — Other Ambulatory Visit: Payer: Self-pay | Admitting: Vascular Surgery

## 2022-12-27 DIAGNOSIS — E1122 Type 2 diabetes mellitus with diabetic chronic kidney disease: Secondary | ICD-10-CM | POA: Diagnosis not present

## 2022-12-27 DIAGNOSIS — Z7984 Long term (current) use of oral hypoglycemic drugs: Secondary | ICD-10-CM | POA: Diagnosis not present

## 2022-12-27 DIAGNOSIS — I5023 Acute on chronic systolic (congestive) heart failure: Secondary | ICD-10-CM | POA: Diagnosis not present

## 2022-12-27 DIAGNOSIS — Z794 Long term (current) use of insulin: Secondary | ICD-10-CM | POA: Diagnosis not present

## 2022-12-27 DIAGNOSIS — I130 Hypertensive heart and chronic kidney disease with heart failure and stage 1 through stage 4 chronic kidney disease, or unspecified chronic kidney disease: Secondary | ICD-10-CM | POA: Diagnosis not present

## 2022-12-27 DIAGNOSIS — N189 Chronic kidney disease, unspecified: Secondary | ICD-10-CM | POA: Diagnosis not present

## 2022-12-30 ENCOUNTER — Ambulatory Visit: Payer: Medicare Other

## 2022-12-30 DIAGNOSIS — Z95810 Presence of automatic (implantable) cardiac defibrillator: Secondary | ICD-10-CM | POA: Diagnosis not present

## 2022-12-30 DIAGNOSIS — I5042 Chronic combined systolic (congestive) and diastolic (congestive) heart failure: Secondary | ICD-10-CM | POA: Diagnosis not present

## 2022-12-31 ENCOUNTER — Other Ambulatory Visit: Payer: Self-pay

## 2022-12-31 ENCOUNTER — Emergency Department (HOSPITAL_BASED_OUTPATIENT_CLINIC_OR_DEPARTMENT_OTHER)
Admission: EM | Admit: 2022-12-31 | Discharge: 2022-12-31 | Discharge status: Against Medical Advice | Payer: Medicare Other | Attending: Emergency Medicine | Admitting: Emergency Medicine

## 2022-12-31 DIAGNOSIS — E1122 Type 2 diabetes mellitus with diabetic chronic kidney disease: Secondary | ICD-10-CM | POA: Diagnosis not present

## 2022-12-31 DIAGNOSIS — X58XXXA Exposure to other specified factors, initial encounter: Secondary | ICD-10-CM | POA: Diagnosis not present

## 2022-12-31 DIAGNOSIS — L84 Corns and callosities: Secondary | ICD-10-CM | POA: Insufficient documentation

## 2022-12-31 DIAGNOSIS — Z5321 Procedure and treatment not carried out due to patient leaving prior to being seen by health care provider: Secondary | ICD-10-CM | POA: Insufficient documentation

## 2022-12-31 DIAGNOSIS — N189 Chronic kidney disease, unspecified: Secondary | ICD-10-CM | POA: Diagnosis not present

## 2022-12-31 DIAGNOSIS — Z794 Long term (current) use of insulin: Secondary | ICD-10-CM | POA: Diagnosis not present

## 2022-12-31 DIAGNOSIS — S91301A Unspecified open wound, right foot, initial encounter: Secondary | ICD-10-CM | POA: Diagnosis not present

## 2022-12-31 DIAGNOSIS — Z7984 Long term (current) use of oral hypoglycemic drugs: Secondary | ICD-10-CM | POA: Diagnosis not present

## 2022-12-31 DIAGNOSIS — I5023 Acute on chronic systolic (congestive) heart failure: Secondary | ICD-10-CM | POA: Diagnosis not present

## 2022-12-31 DIAGNOSIS — I130 Hypertensive heart and chronic kidney disease with heart failure and stage 1 through stage 4 chronic kidney disease, or unspecified chronic kidney disease: Secondary | ICD-10-CM | POA: Diagnosis not present

## 2022-12-31 NOTE — ED Triage Notes (Signed)
Callus on right foot for several years. Pulled it off this AM. Bottom of foot about 2cm avulsion. Concerned about continued bleeding- takes Effient. Was dressed around 2pm. Re-dress in triage. Bleeding has stop without pressure- sterile non-stick applied and wrapped. Right leg appears purple and swollen- this is reported to be improved from baseline- has had femoral plaque cleaned and blood flow has been improving over past weeks. Dorsal pedal pulses intact.

## 2023-01-02 NOTE — Progress Notes (Signed)
EPIC Encounter for ICM Monitoring  Patient Name: Luke [name] is a 77 y.o. male Date: 01/02/2023 Primary Care Physican: [name], MD Primary Cardiologist: Anne Fu Electrophysiologist: Joycelyn Schmid Pacing: >99%          11/12/2022 Weight: 170 lbs (baseline 174 lbs) 01/01/2023 Weight: 170.4 01/02/2023 Weight: 172.6 lbs                                                            Spoke with patient and heart failure questions reviewed.  Transmission results reviewed.  Pt reports SOB and following up with pulmonologist and wears oxygen.     CorVue thoracic impedance suggesting  normal fluid levels with the exception of possible fluid accumulation from 8/2-8/11.   Prescribed:  Furosemide 40 mg take 1 tablet(s) (40 mg total) by mouth daily.   Labs: 11/20/2022 Creatinine 1.17, BUN 23, Potassium 3.9, Sodium 135, GFR >60 11/08/2022 Creatinine 1.40, BUN 31, Potassium 5.0, Sodium 137  11/01/2022 Creatinine 1.16, BUN 23, Potassium 3.8, Sodium 139, GFR >60  10/31/2022 Creatinine 1.33, BUN 23, Potassium 3.3, Sodium 137, GFR 54  10/30/2022 Creatinine 1.50, BUN 24, Potassium 4.4, Sodium 41, GFR >60 10/29/2022 Creatinine 1.15, BUN 24, Potassium 4.4, Sodium 141, GFR >60  10/23/2022 Creatinine 1.21, BUN 27, Potassium 4.7, Sodium 136, GFR >60  10/11/2022 Creatinine 1.15, BUN 25, Potassium 3.9, Sodium 138, GFR >60  10/05/2022 Creatinine 1.08, BUN 24, Potassium 4.2, Sodium 136, GFR >60  A complete set of results can be found in Results Review.   Recommendations:  No changes and encouraged to call if experiencing any fluid symptoms.   Follow-up plan: ICM clinic phone appointment on 02/10/2023/2024.   91 day device clinic remote transmission 01/22/2023.     EP/Cardiology Office Visits:  Recall 12/12/2023 with Dr. Graciela Husbands  01/23/2023 with Dr Anne Fu.     Copy of ICM check sent to Dr. Graciela Husbands.   3 month ICM trend: 12/30/2022.    12-14 Month ICM trend:     Karie Soda, RN 01/02/2023 8:24 AM

## 2023-01-09 ENCOUNTER — Ambulatory Visit (HOSPITAL_COMMUNITY)
Admission: RE | Admit: 2023-01-09 | Discharge: 2023-01-09 | Disposition: A | Discharge status: Home / Self Care | Payer: Medicare Other | Source: Ambulatory Visit | Attending: Internal Medicine | Admitting: Internal Medicine

## 2023-01-09 ENCOUNTER — Encounter: Payer: Self-pay | Admitting: Internal Medicine

## 2023-01-09 ENCOUNTER — Ambulatory Visit (INDEPENDENT_AMBULATORY_CARE_PROVIDER_SITE_OTHER): Payer: Medicare Other | Admitting: Internal Medicine

## 2023-01-09 VITALS — BP 104/60 | HR 84 | Temp 97.3°F | Ht 72.0 in | Wt 176.0 lb

## 2023-01-09 DIAGNOSIS — J84112 Idiopathic pulmonary fibrosis: Secondary | ICD-10-CM

## 2023-01-09 DIAGNOSIS — J432 Centrilobular emphysema: Secondary | ICD-10-CM | POA: Diagnosis not present

## 2023-01-09 DIAGNOSIS — R0902 Hypoxemia: Secondary | ICD-10-CM | POA: Diagnosis not present

## 2023-01-09 DIAGNOSIS — Z5181 Encounter for therapeutic drug level monitoring: Secondary | ICD-10-CM

## 2023-01-09 DIAGNOSIS — Z8679 Personal history of other diseases of the circulatory system: Secondary | ICD-10-CM

## 2023-01-09 DIAGNOSIS — R0602 Shortness of breath: Secondary | ICD-10-CM

## 2023-01-09 LAB — PULMONARY FUNCTION TEST
DL/VA % pred: 35 %
DL/VA: 1.38 ml/min/mmHg/L
DLCO unc % pred: 25 %
DLCO unc: 6.86 ml/min/mmHg
FEF 25-75 Pre: 1.25 L/s
FEF2575-%Pred-Pre: 52 %
FEV1-%Pred-Pre: 75 %
FEV1-Pre: 2.48 L
FEV1FVC-%Pred-Pre: 91 %
FEV6-%Pred-Pre: 84 %
FEV6-Pre: 3.6 L
FEV6FVC-%Pred-Pre: 101 %
FVC-%Pred-Pre: 82 %
FVC-Pre: 3.76 L
Pre FEV1/FVC ratio: 66 %
Pre FEV6/FVC Ratio: 96 %
RV % pred: 84 %
RV: 2.26 L
TLC % pred: 81 %
TLC: 6.05 L

## 2023-01-09 LAB — BASIC METABOLIC PANEL
BUN: 28 mg/dL — ABNORMAL HIGH (ref 6–23)
CO2: 28 meq/L (ref 19–32)
Calcium: 10.3 mg/dL (ref 8.4–10.5)
Chloride: 96 meq/L (ref 96–112)
Creatinine, Ser: 1.24 mg/dL (ref 0.40–1.50)
GFR: 56.58 mL/min — ABNORMAL LOW (ref >60.00)
Glucose, Bld: 258 mg/dL — ABNORMAL HIGH (ref 70–99)
Potassium: 4.6 meq/L (ref 3.5–5.1)
Sodium: 135 meq/L (ref 135–145)

## 2023-01-09 LAB — CBC WITH DIFFERENTIAL/PLATELET
Basophils Absolute: 0.1 10*3/uL (ref 0.0–0.1)
Basophils Relative: 0.7 % (ref 0.0–3.0)
Eosinophils Absolute: 0.3 10*3/uL (ref 0.0–0.7)
Eosinophils Relative: 1.8 % (ref 0.0–5.0)
HCT: 37.8 % — ABNORMAL LOW (ref 39.0–52.0)
Hemoglobin: 12.3 g/dL — ABNORMAL LOW (ref 13.0–17.0)
Lymphocytes Relative: 11 % — ABNORMAL LOW (ref 12.0–46.0)
Lymphs Abs: 1.6 10*3/uL (ref 0.7–4.0)
MCHC: 32.5 g/dL (ref 30.0–36.0)
MCV: 97.3 fl (ref 78.0–100.0)
Monocytes Absolute: 0.8 10*3/uL (ref 0.1–1.0)
Monocytes Relative: 5.5 % (ref 3.0–12.0)
Neutro Abs: 11.5 10*3/uL — ABNORMAL HIGH (ref 1.4–7.7)
Neutrophils Relative %: 81 % — ABNORMAL HIGH (ref 43.0–77.0)
Platelets: 419 10*3/uL — ABNORMAL HIGH (ref 150.0–400.0)
RBC: 3.89 Mil/uL — ABNORMAL LOW (ref 4.22–5.81)
RDW: 13 % (ref 11.5–15.5)
WBC: 14.2 10*3/uL — ABNORMAL HIGH (ref 4.0–10.5)

## 2023-01-09 LAB — HEPATIC FUNCTION PANEL
ALT: 10 U/L (ref 0–53)
AST: 13 U/L (ref 0–37)
Albumin: 4 g/dL (ref 3.5–5.2)
Alkaline Phosphatase: 73 U/L (ref 39–117)
Bilirubin, Direct: 0 mg/dL (ref 0.0–0.3)
Total Bilirubin: 0.4 mg/dL (ref 0.2–1.2)
Total Protein: 7.9 g/dL (ref 6.0–8.3)

## 2023-01-09 LAB — BRAIN NATRIURETIC PEPTIDE: Pro B Natriuretic peptide (BNP): 166 pg/mL — ABNORMAL HIGH (ref 0.0–100.0)

## 2023-01-09 NOTE — Patient Instructions (Addendum)
ICD-10-CM   1. IPF (idiopathic pulmonary fibrosis) (HCC)  J84.112     2. Centrilobular emphysema (HCC)  J43.2     3. Medication monitoring encounter  Z51.81     4. Exercise hypoxemia  R09.02          Re IPF  -Concerned you are getting worse because of worsening shortness of breath, pulmonary function test and new exercise hypoxemia.  Alternatively could be heart failure   Plan - cotninue esbriet  at 2 pill thress times daily; take with food -Check blood work for CBC, chemistry, liver function test and BNP 01/09/2023 -Do high-resolution CT chest within the next 1 to 2 weeks  -Supine and prone -If indeed pulmonary fibrosis is getting worse then clinical trials as a care option is the next best option -Take handicap placard -Start portable oxygen system 2 L nasal cannula - Continue nighttime oxygen as before   Emphysema (minor component)   Stable  Plan -continue spiriva respimat 2 puff daily  - continue  albuterol prn  Lung nodules right - cluster - new Aug 2023 - resolved NOv 2023  Plan  -reassure   Followup -  --  video visit Dr. Marchelle Gearing [ideally] or nurse practitioner in the next 2 weeks but after completing blood work and CT

## 2023-01-09 NOTE — Addendum Note (Signed)
Addended by: Lanna Poche on: 01/09/2023 03:32 PM   Modules accepted: Orders

## 2023-01-09 NOTE — Progress Notes (Signed)
OV 01/02/2021  Subjective:  Patient ID: Luke [name], male , DOB: 05-04-1946 , age 77 y.o. , MRN: [id number] , ADDRESS: [address] [place] [address] PCP [name], MD Patient Care Team: [name], MD as PCP - General [name], MD (Vascular Surgery) Willis Modena, MD (Gastroenterology) Karie Soda, MD (General Surgery) Tia Alert, MD (Neurosurgery) Davina Poke (Optometry) Jake Bathe, MD (Cardiology) Phil Dopp, [facility] as Pharmacist (Pharmacist)  This Provider for this visit: Treatment Team:  Attending Provider: Kalman Shan, MD    01/02/2021 -transfer of care to the interstitial lung disease center for Dr. Marchelle Gearing.  Referral from Dr. Audie Box Chief Complaint  Patient presents with   Consult    ILD consult per Dr. Tonia Brooms. Pt states he has been doing okay since last visit and denies any complaints.     HPI Luke [name] 77 y.o. -referred by Dr. Audie Box for interstitial lung disease evaluation   [place] Integrated Comprehensive ILD Questionnaire  Symptoms:  -Reports insidious onset of shortness of breath gradually.  It is the same since it started.  He is unclear how long he is headed but he had it for a few years.  There is no cough at all.  There is no clearing of the throat.  There is no fatigue.  His appetite is good no nausea no vomiting no diarrhea no anxiety no depression no chronic pain    Past Medical History :   In 2009 he was diagnosed with systolic heart failure.  He believes etiology is ischemic based on a remote heart attack.  Has a history of diabetes for several years.  Has kidney disease not otherwise specified for several years.  His GFR in May 2022 was greater than 60 with a creatinine 1.23 mg percent.   His last echocardiogram was in 2019.  He sees cardiology Dr. Graciela Husbands and Dr. Donato Schultz.  Overall he is deemed to be stable.  He had pacemaker check recently.  Denies any  collagen vascular disease or vasculitis sleep apnea.  Denies any PE.  He has had COVID-vaccine but has not had COVID. ROS:   -Positive for fatigue.  Has some dysphagia for the last few days but otherwise okay.  No nausea no vomiting no heartburn no snoring no rash no ulcers.   FAMILY HISTORY of LUNG DISEASE: Denies   EXPOSURE HISTORY: Smokes cigarettes 20 1966 in 2012 30 cigarettes/day.  He did have some passive smoking.  Did smoke marijuana between 1968 and 1970.  Once a month.  No cocaine use no intravenous drug use   HOME and HOBBY DETAILS : [place]-family [place] the last 25 years the age of the home is 75 years.  There is no dampness.  No mildew no mold.  No humidifier use no CPAP use no nebulizer use.  No steam iron use no Jacuzzi use.  No misting [place] no pet birds or parakeets no pet gerbils.  No mold in the [facility] duct.  Last checked in 2009.  Does do gardening.  No birds at this no strong mats no water damage no Jacuzzi.   OCCUPATIONAL HISTORY (122 questions) : He was in [place] and got exposed to agent orange but otherwise detailed questioning is negative other than the fact he worked with some petroleum based cleaning agents.   PULMONARY TOXICITY HISTORY (27 items): He was on hydralazine in 2019.   HRCT May 2022 -  personally visualzied -definite progression is combined emphysema with UIP features.  IMPRESSION: 1. Spectrum of findings compatible with basilar predominant fibrotic interstitial lung disease without frank honeycombing, with mild progression at the lung bases since 03/25/2018 CT abdomen study, with more clear progression since 10/07/2011 CT abdomen study. Findings are categorized as probable UIP per consensus guidelines: Diagnosis of Idiopathic Pulmonary Fibrosis: An Official ATS/ERS/JRS/ALAT Clinical Practice Guideline. Am Rosezetta Schlatter Crit Care Med Vol 198, Iss 5, [id number], Jan 04 2017. 2. Scattered small solid pulmonary nodules, largest 5 mm.  Follow-up noncontrast chest CT recommended in 12 months. This recommendation follows the consensus statement: Guidelines for Management of Incidental Pulmonary Nodules Detected on CT Images: From the Fleischner Society 2017; Radiology 2017; 284:228-243. 3. Three-vessel coronary atherosclerosis. 4. Chronic atrophy of pancreatic body and tail, progressive since 2019 CT abdomen study, without discrete mass on this noncontrast CT, indeterminate but presumably due to progressive chronic pancreatitis. 5. Aortic Atherosclerosis (ICD10-I70.0) and Emphysema (ICD10-J43.9).     Electronically Signed   By: Delbert Phenix M.D.   On: 10/03/2020 16:53  SErology  Results for LONNEL, JARED" (MRN [id number]) as of 01/02/2021 14:44  Ref. Range 11/13/2020 16:09  Anti Nuclear Antibody (ANA) Latest Ref Range: NEGATIVE  NEGATIVE  Angiotensin-Converting Enzyme Latest Ref Range: 9 - 67 U/L 27  Cyclic Citrullin Peptide Ab Latest Units: UNITS <16  ds DNA Ab Latest Units: IU/mL <1  Myeloperoxidase Abs Latest Units: AI <1.0  Serine Protease 3 Latest Units: AI <1.0  RA Latex Turbid. Latest Ref Range: <14 IU/mL <14  SSA (Ro) (ENA) Antibody, IgG Latest Ref Range: <1.0 NEG AI <1.0 NEG  SSB (La) (ENA) Antibody, IgG Latest Ref Range: <1.0 NEG AI <1.0 NEG  Scleroderma (Scl-70) (ENA) Antibody, IgG Latest Ref Range: <1.0 NEG AI <1.0 NEG    eCHO arch 2019  Study Conclusions   - Left ventricle: The cavity size was normal. Wall thickness was    normal. Systolic function was normal. The estimated ejection    fraction was in the range of 55% to 60%. Wall motion was normal;    there were no regional wall motion abnormalities. Doppler    parameters are consistent with abnormal left ventricular    relaxation (grade 1 diastolic dysfunction).  - Mitral valve: There was mild regurgitation.  - Pulmonary arteries: Systolic pressure was moderately increased.    PA peak pressure: 40 mm Hg (S).     OV 02/14/2021 -  telephine visit  Subjective:  Patient ID: Luke [name], male , DOB: 1945-06-07 , age 77 y.o. , MRN: [id number] , ADDRESS: [address] [place] [address] PCP [name], MD Patient Care Team: [name], MD as PCP - General [name], MD (Vascular Surgery) Willis Modena, MD (Gastroenterology) Karie Soda, MD (General Surgery) Tia Alert, MD (Neurosurgery) Davina Poke [place]) Jake Bathe, MD (Cardiology) Phil Dopp, [facility] as Pharmacist (Pharmacist)   Type of visit: Telephone/Video Circumstance: COVID-19 national emergency Identification of patient TRAYLON OTTENS with 1946/04/19 and MRN [id number] - 2 person identifier Risks: Risks, benefits, limitations of telephone visit explained. Patient understood and verbalized agreement to proceed Anyone else on call: just patient Patient location: [phone] -  This provider location: [address], [employer] [place]  This Provider for this visit: Treatment Team:  Attending Provider: Kalman Shan, MD    02/14/2021 -  FU IPF with some emphysema. Has sCHF  HPI Luke [name] 77 y.o. -telephone visit to  see how his esbriet started is going butt turns out he says he never got referred to pharmacist. Pharmacist confirmed that she is yet to meet iwht him. No esbriet started. Says even spirivan not started. Also, supposed to be telephone visit but got confusing call and he showed up at front desk only to be sent home. Apologized for gap in customer service. Overall well. He said after last vsit he read more about esbriet and got apprehensive esp in light of CKD but lab review shows GFR > 60 this year. 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  Instructions   - Slowly increase the dose per protocol  -Always take it with food  -Any nausea you can try ginger capsules  -Give at least between 5 and 6  hours between dosing  -Good idea to participate in manufacturer [place] sponsored medication support program - this is optional  -Definitely apply sunscreen when you go out with this medication  - You will need monthly liver tests for 6 months and thereafter every 3 months  - explained in decreaesd renal functio that side ffects could be higher but his GFR is good enough for full dose  - explained benefit of slowing fibrosis progression and that NNT of 1 to 6 is pretty good   We resolved we will jsu start and titrate up to max dose of 2 pills tid which is 2/3rd normal dose   CT Chest data  No results found.  Results for TURRELL, LOAIZA" (MRN [id number]) as of 02/14/2021 10:42  Ref. Range 10/23/2020 10:57  Creatinine Latest Ref Range: 0.76 - 1.27 mg/dL 8.65  Results for JARAD, KINCHELOE" (MRN [id number]) as of 02/14/2021 10:42  Ref. Range 01/02/2021 15:40  AST Latest Ref Range: 0 - 37 U/L 17  ALT Latest Ref Range: 0 - 53 U/L 16      OV 05/10/2021  Subjective:  Patient ID: Luke [name], male , DOB: June 21, 1945 , age 44 y.o. , MRN: [id number] , ADDRESS: [address] [place] [address] PCP [name], MD Patient Care Team: [name], MD as PCP - General [name], MD (Vascular Surgery) Willis Modena, MD (Gastroenterology) Karie Soda, MD (General Surgery) Tia Alert, MD (Neurosurgery) Davina Poke (Optometry) Jake Bathe, MD (Cardiology) Phil Dopp, [facility] as Pharmacist (Pharmacist)  This Provider for this visit: Treatment Team:  Attending Provider: Kalman Shan, MD    05/10/2021 -   Chief Complaint  Patient presents with   Follow-up    PFT performed today.  Pt states he has been doing okay since last visit and denies any complaints.   02/14/2021 -  FU IPF with some emphysema. Has sCHF = - esbriet shipment at his home 03/23/21  - on esbiret 2 pills tid due to CKD hx (ofev 2nd choice due to his heart  issues)     HPI Luke [name] 77 y.o. -presents for follow-up.  Since his last visit he started pirfenidone.  After starting pirfenidone he developed COVID-19.  In the midst of all this he had to hold his COVID.  He also had diarrhea.  It was not fully clear in November 2022 whether the diarrhea was from COVID or from pirfenidone.  Nevertheless sometime around Thanksgiving he restarted pirfenidone.  He slowly escalated himself within a week to 2 pills 3 times daily.  He is by choice  at 2 pills 3 times daily because of a history of chronic kidney disease although most recent creatinine and GFR were greater than 60.  He says currently is doing 2 pills 3 times daily [submaximal but still therapeutic effect] and is tolerating this well without any GI side effects.  He does have chronic dry skin with flaking.  He says it is refractory.  We discussed about trying flaxseed.  He will do that.  We reviewed his first pulmonary function test.  Between the [facility] and DLCO when averaged he has 60% lung capacity.  He is doing Spiriva but is not sure it is helping him.  His most recent liver function test was 1 month ago.  He will have another 1 today.        OV 08/08/2021  Subjective:  Patient ID: Luke [name], male , DOB: 02-Oct-1945 , age 48 y.o. , MRN: [id number] , ADDRESS: [address] [place] [address] PCP [name], MD Patient Care Team: [name], MD as PCP - General [name], MD (Vascular Surgery) Willis Modena, MD (Gastroenterology) Karie Soda, MD (General Surgery) Tia Alert, MD (Neurosurgery) Davina Poke (Optometry) Jake Bathe, MD (Cardiology) Phil Dopp, [facility] as Pharmacist (Pharmacist)  This Provider for this visit: Treatment Team:  Attending Provider: Kalman Shan, MD    08/08/2021 -   Chief Complaint  Patient presents with   Follow-up    3 mo f/u for IPF. Stopped the Esbriet due to increased dizziness combined with the  isosorbide. Will resume in a few weeks.    02/14/2021 -  FU IPF with some emphysema. Has sCHF = - esbriet shipment at his home 03/23/21  - on esbiret 2 pills tid due to CKD hx (ofev 2nd choice due to his heart issues)  HPI Luke [name] 77 y.o. -returns for follow-up.  At this visit the original plan was to increase his pirfenidone but he tells me that while doing his daily exercises he started noticing chest pain on the treadmill.  This followed eye surgery in January 2023.  He had cardiac cath mid March 2023 and he had 100% blockage in one of his vessels.  His isosorbide was then increased.  He started feeling dizzy.  He attributed part of the dizziness due to pirfenidone.  He says pirfenidone was already giving him dizziness and this was made worse by the isosorbide.  He then decided to take his isosorbide half pill twice daily which helped his dizziness.  But he still was dizzy so he stopped his pirfenidone and the dizziness improved a lot.  He decided to protect his heart over his lung.  He has upcoming visit with cardiology in Sep 11, 2021.  Till then he wants to be on pirfenidone holiday.  After that he is definitely intending to restart the pirfenidone.  We discussed nintedanib and because of potential cardiac risk he does not want to do it.  We discussed clinical trials he is interested but he wants to rechallenge himself with pirfenidone first.  Currently he is walking 2 miles per day on a treadmill at 4 miles an hour without an incline and it takes 30 minutes.  Current symptom score is below.  Labs look okay .  The chest pain is improved  Regarding his dry skin: This is better after flaxseed.  He asked for some advice about reversing atherosclerosis.  I referred him to the [employer].  He understands this means strict  vegetarian/vegan diet.  He admits readily that he is a carnivore.  In the last meat-based diet.   OV 10/03/2021  Subjective:  Patient ID: Luke [name], male ,  DOB: Jun 10, 1945 , age 22 y.o. , MRN: [id number] , ADDRESS: [address] [place] [address] PCP [name], MD Patient Care Team: [name], MD as PCP - General Jake Bathe, MD as PCP - Cardiology (Cardiology) [name], MD (Vascular Surgery) Willis Modena, MD (Gastroenterology) Karie Soda, MD (General Surgery) Tia Alert, MD (Neurosurgery) Davina Poke (Optometry) Jake Bathe, MD (Cardiology) Phil Dopp, [facility] as Pharmacist (Pharmacist)  This Provider for this visit: Treatment Team:  Attending Provider: Kalman Shan, MD  02/14/2021 -  FU IPF with some emphysema. Has sCHF = - esbriet shipment at his home 03/23/21  - on esbiret 2 pills tid due to CKD hx (ofev kept as 2nd choice due to his heart issues)  -  esbriet on hold since mid march 2023 due to dizziness made by worse by nitrate and improved after stopping esbriet  - restart late may 2023  10/03/2021 -   Chief Complaint  Patient presents with   Follow-up    Pt states he has been doing okay since last visit and denies any real complaints.     HPI Luke [name] 77 y.o. -returns for follow-up.  He says he is doing well.  The symptoms are minimal.  He continues to exercise.  He says he has difficulty tolerating medication adds.  He has not been stable on the isosorbide which gave him dizziness in the setting of pirfenidone.  He stopped the pirfenidone although he attributed the dizziness to the isosorbide.  After stopping pirfenidone his dizziness went away.  Now for the last 20 days he is also been on valsartan.  He believes it was a new start.  For the last 1 week he is now back on pirfenidone at 1 pill 3 times daily.  There are no side effects right now.  He is trying to go to 2 pills 3 times daily [the maximum dose and account of his CKD].  He is worried that he can get side effects such as fogginess and headaches.  But he is willing to take it and monitor.  He is  interested in clinical trials but at this point in time his is.  Is not on a stable dose.  His symptom score is stable His pulmonary function test shows stability/improvement in FVC.  Last CT scan of the chest May 2022     OV 01/10/2022  Subjective:  Patient ID: Luke [name], male , DOB: 1945/12/01 , age 19 y.o. , MRN: [id number] , ADDRESS: [address] [place] [address] PCP [name], MD Patient Care Team: [name], MD as PCP - General Jake Bathe, MD as PCP - Cardiology (Cardiology) [name], MD (Vascular Surgery) Willis Modena, MD (Gastroenterology) Karie Soda, MD (General Surgery) Tia Alert, MD (Neurosurgery) Davina Poke (Optometry) Jake Bathe, MD (Cardiology) Kathyrn Sheriff, [facility] as Pharmacist (Pharmacist)  This Provider for this visit: Treatment Team:  Attending Provider: Kalman Shan, MD    01/10/2022 -   Chief Complaint  Patient presents with   Follow-up    PFT performed today.  Pt states he has been dong okay since last visit and denies any complaints.   HPI Luke [name] 77 y.o. -returns for follow-up.  He continues to  do well.  He is back on pirfenidone at 2 pills 3 times daily.  He does not want to escalate this further because of dizziness that he feels is because of valsartan and nitrates.  He has adjusted his valsartan and nitrate regimen but he still has dizziness.  With the pirfenidone he feels goofy.  He says the side effects overlapping but somewhat different.  He is able to differentiate the side effects between the 2 but certainly if he increases his pirfenidone he will get more dizzy.  His shortness of breath is stable his walking desaturation test is stable.  His pulmonary function test is stable.  His CT scan of the chest also shows stability and IPF over 1 year.  Of note he has some new nodules described in the right lower lobe in August 2023 CT chest.  A 28-month follow-up is  recommended.  He had recent liver function test and that is normal.   CT Chest data - HRCT Aug 2023  Narrative & Impression  CLINICAL DATA:  Interstitial lung disease   EXAM: CT CHEST WITHOUT CONTRAST   TECHNIQUE: Multidetector CT imaging of the chest was performed following the standard protocol without intravenous contrast. High resolution imaging of the lungs, as well as inspiratory and expiratory imaging, was performed.   RADIATION DOSE REDUCTION: This exam was performed according to the departmental dose-optimization program which includes automated exposure control, adjustment of the mA and/or kV according to patient size and/or use of iterative reconstruction technique.   COMPARISON:  Chest CT dated Oct 03, 2020   FINDINGS: Cardiovascular: Normal heart size. No pericardial effusion. Normal caliber thoracic aorta with severe calcified plaque. Severe coronary artery calcifications of the LAD. Partially visualized left chest wall pacer wires with leads in the right atrium, right ventricle and lead overlying the left ventricle.   Mediastinum/Nodes: Esophagus and thyroid are unremarkable. No pathologically enlarged lymph nodes seen in the chest.   Lungs/Pleura: Central airways are patent. No significant air trapping. Severe centrilobular emphysema. Subpleural and basilar predominant reticular opacities with associated ground-glass, traction bronchiectasis. Honeycomb change is seen in the medial right lower lobe on series 12 image 199, not significantly changed when compared with prior exam. No definite evidence of progressive fibrosis. New clustered part solid nodules of the right lower lobe, largest nodule measures 17 mm in overall diameter with 6 mm solid component, located on series 12, image 261.   Upper Abdomen: No acute abnormality.   Musculoskeletal: No chest wall mass or suspicious bone lesions identified.   IMPRESSION: 1. Basilar and subpleural  predominant fibrotic interstitial lung disease with traction bronchiectasis and honeycomb change. No evidence of progression. Findings are consistent with UIP per consensus guidelines: Diagnosis of Idiopathic Pulmonary Fibrosis: An Official ATS/ERS/JRS/ALAT Clinical Practice Guideline. Am Rosezetta Schlatter Crit Care Med Vol 198, Iss 5, [phone], Jan 04 2017. 2. New clustered part solid pulmonary nodules of the right lower lobe, likely due to infection or aspiration. Recommend follow-up chest CT in 3 months to ensure resolution. 3. Aortic Atherosclerosis (ICD10-I70.0) and Emphysema (ICD10-J43.9).     Electronically Signed   By: Allegra Lai M.D.   On: 12/07/2021 10:54    No results found.   OV 04/04/2022  Subjective:  Patient ID: Luke [name], male , DOB: 1946/03/04 , age 34 y.o. , MRN: [id number] , ADDRESS: [address] [place] [address] PCP [name], MD Patient Care Team: [name], MD as PCP - General Jake Bathe, MD as  PCP - Cardiology (Cardiology) [name], MD (Vascular Surgery) Willis Modena, MD (Gastroenterology) Karie Soda, MD (General Surgery) Tia Alert, MD (Neurosurgery) Davina Poke (Optometry) Jake Bathe, MD (Cardiology) Kathyrn Sheriff, [facility] as Pharmacist (Pharmacist)  This Provider for this visit: Treatment Team:  Attending Provider: Kalman Shan, MD     04/04/2022 -   Chief Complaint  Patient presents with   Follow-up    PFT performed today.  Pt states he has been doing okay since last visit and denies any complaints.     HPI Luke [name] 77 y.o. -returns for follow-up.  At this visit he had pulmonary function test that shows stability in FVC but his DLCO is decreased.  Symptom wise he feels stable.  He had a high-resolution CT chest that showed stability in the ILD for over a time.  He believes the DLCO decline is because of his heart failure because he feels good.  He is able to mow  his lawn and raking leaves.  Based on the decline in DLCO he is open to getting his BNP and also CBC checked.  He also wants his kidney function checked.  He is compliant with his medications.  Of note his lung nodules have resolved on the CT chest. LFT normal Nov 2023  He continues on Esbriet 2 pills 3 times daily. 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 - 10 days ago he also fell from the ladder and sprained his ankle     OV 07/29/2022  Subjective:  Patient ID: Luke [name], male , DOB: 06/29/1945 , age 41 y.o. , MRN: [id number] , ADDRESS: [address] [place] [address] PCP [name], MD Patient Care Team: [name], MD as PCP - General Jake Bathe, MD as PCP - Cardiology (Cardiology) [name], MD (Vascular Surgery) Willis Modena, MD (Gastroenterology) Karie Soda, MD (General Surgery) Tia Alert, MD (Neurosurgery) Davina Poke (Optometry) Jake Bathe, MD (Cardiology) Kathyrn Sheriff, [facility] as Pharmacist (Pharmacist)  This Provider for this visit: Treatment Team:  Attending Provider: Kalman Shan, MD   07/29/2022 -   Chief Complaint  Patient presents with   Follow-up    Review PFT today.  Doing well.  Using exercise bike at home and gym 3 days a week.     HPI Luke [name] 77 y.o. -returns for follow-up.  At his last visit he was feeling well and his CT scan was stable his FVC was stable but his DLCO had dropped.  Therefore we decided to bring him back a little bit early to recheck his pulmonary function test.  He now tells me at the last pulmonary function test he did not have his dentures and possibly there was an air leak.  Today his FVC and DLCO are all stable.  He feels good.  He is tolerating his pirfenidone well.  Weight is stable.  He is dealing with other issues other than IPF.  He says his right lower extremity one of the main arteries is got claudication again.  He has a right  foot callus that is now an ulcer.  He has to have surgery for both.  He is dealing with these issues.  Most recent liver function test was in February 2024 and pirfenidone and normal.  His current symptom scores are listed below.  His walking desaturation test is stable.        OV 01/09/2023  Subjective:  Patient ID: Luke [name], male , DOB: [birth year] , age 87 y.o. , MRN: [id number] , ADDRESS: [address] [place] [address] PCP [name], MD Patient Care Team: [name], MD as PCP - General Jake Bathe, MD as PCP - Cardiology (Cardiology) Duke Salvia, MD as PCP - Electrophysiology (Cardiology) [name], MD (Vascular Surgery) Willis Modena, MD (Gastroenterology) Karie Soda, MD (General Surgery) Arman Bogus, MD (Neurosurgery) Davina Poke (Optometry) Jake Bathe, MD (Cardiology) Kathyrn Sheriff, [facility] (Inactive) as Pharmacist (Pharmacist) Otho Ket, RN as [facility] Care Management  This Provider for this visit: Treatment Team:  Attending Provider: Kalman Shan, MD    FU IPF with some emphysema. Has sCHF = - esbriet shipment at his home 03/23/21  - on esbiret 2 pills tid due to CKD hx (ofev kept as 2nd choice due to his heart issues)  -  esbriet on hold since mid march 2023 due to dizziness made by worse by nitrate and improved after stopping esbriet  - restart late may 2023  -Noiv 2023:  - Stable IPF on high-resolution CT chest , FVC and symptoms November 2023 - stable PFT March 2024  Pulmonary nodules resolved November 2023  01/09/2023 -   Chief Complaint  Patient presents with   Follow-up    SOB  PFT 01/09/2023     HPI Luke [name] 77 y.o. -routine follow-up.  Presents with his wife [name] who I am meeting for the first time.  She is a patient of Dr. Tonia Brooms.  He tells me since his last visit he has had 4 coronary stents placed including May and June 2024.  After that he  also had allergy to Plavix.  He is also had vascular surgery to his lower extremity.  He says after all this he is now more short of breath.  He feels he is declining.  Dyspnea scale is 11.  He feels oxygen at night is helping him.  Dyspnea score is definitely worse.  He is tolerating pirfenidone fine.  In June 2024 his BNP was elevated.  He and his wife are wondering if the decline is because of his heart.  He did have pulmonary function test today.  I personally visualized the report and showed it to him.  His FVC stable in the last 1 year but his DLCO shows a dramatic decline.  There is no history of anemia.  We did a sit/stand exercise hypoxemia test and is having new onset desaturations below 88%.  There is no orthopnea or paroxysmal nocturnal dyspnea edema.   SYMPTOM SCALE - ILD 01/02/2021  05/10/2021 191# 08/08/2021 188# -4 miles an hour on a treadmill for 30 minutes.  Covers 2 miles on no incline 10/03/2021  01/10/2022 187# 04/04/2022   07/29/2022  01/09/2023 176#  O2 use ra ra ra ra ra ra ra ra  Shortness of Breath 0 -> 5 scale with 5 being worst (score 6 If unable to do)       0  At rest 0 0 0 0 0 0 0 0  Simple tasks - showers, clothes change, eating, shaving 0 0 0  0 0 0 1  Household (dishes, doing bed, laundry) 0 0 0 0 0 0 0 1  Shopping 0 0 0 0 0 0 2 1  Walking level at own pace 0 0 0 00 0 0 2 3  Walking up Stairs 2 1 2 2  2  4 2 5   Total (30-36) Dyspnea Score 2 1 2 2 2 4 6 11   How bad is your cough? 0 0 0 00 0 0 0 0  How bad is your fatigue 00 0 0 0 0 0 0 0  How bad is nausea 0 0 0 0 0 0 0 0  How bad is vomiting?  0 0 0 0  0 0 0  How bad is diarrhea? 00 0 0 0 0 0 0 0  How bad is anxiety? 0 0 0 0 00 0 0 00  How bad is depression 0 00 0 0 0 0 0 0        Simple office walk 185 feet x  3 laps goal with forehead probe 01/02/2021  08/08/2021  01/10/2022  07/29/2022  01/09/2023   O2 used ra ra ra ra Ra  Number laps completed 3 3 2 3  Sit stand x 10  Comments about pace avg      Resting  Pulse Ox/HR 97% and 71/min 98% and HR 71 98% and HR66 98% and HR 65 96% and HR 86  Final Pulse Ox/HR 95% and 94/min 91% and HR 86 93% and HR 91 92% an dHR 84 84% and HR 94. Corrected with 2L o2  Desaturated </= 88% no no No no   Desaturated <= 3% points no Yes, 7 poins Yes 5 ponts Yes 6 potn   Got Tachycardic >/= 90/min yes no yes no   Symptoms at end of test none none none none Level 2 dyspea  Miscellaneous comments x    worsening     PFT     Latest Ref Rng & Units 01/09/2023   11:01 AM 07/29/2022   10:16 AM 04/04/2022    1:20 PM 01/10/2022    1:42 PM 10/02/2021    8:45 AM 05/10/2021   11:45 AM  ILD indicators  FVC-Pre L 3.76  P 3.83  3.79  3.73  4.09  3.63   FVC-Predicted Pre % 82  P 83  82  81  88  78   TLC L 6.05  P       TLC Predicted % 81  P       DLCO uncorrected ml/min/mmHg 6.86  P 11.69  9.71  11.84  13.10  11.58   DLCO UNC %Pred % 25  P 43  36  44  48  43   DLCO Corrected ml/min/mmHg  11.69  9.71  11.84  13.45  11.36   DLCO COR %Pred %  43  36  44  50  42     P Preliminary result      LAB RESULTS last 96 hours No results found.  LAB RESULTS last 90 days Recent Results (from the past 2160 hour(s))  CUP PACEART REMOTE DEVICE CHECK     Status: None   Collection Time: 10/23/22  2:05 AM  Result Value Ref Range   Date Time Interrogation Session [phone]    Pulse Generator Manufacturer [employer]    Pulse Gen Model [id number] Gallant HF    Pulse Gen Serial Number [id number]    Clinic Name [facility]    Implantable Pulse Generator Type Cardiac Resynch Therapy Defibulator    Implantable Pulse Generator Implant Date [id number]    Implantable Lead Manufacturer [facility]    Implantable Lead Model 1458Q Quartet    Implantable Lead Serial Number [id number]    Implantable Lead Implant Date [id number]    Implantable Lead Location Detail  1 Lateral Wall    Implantable Lead Location [id number]    Implantable Lead Connection Status [id number]    Implantable Lead Manufacturer [facility]     Implantable Lead Model [id number] Tendril STS    Implantable Lead Serial Number [id number]    Implantable Lead Implant Date [id number]    Implantable Lead Location [id number]    Implantable Lead Connection Status [id number]    Implantable Lead Manufacturer [facility]    Implantable Lead Model [id number] Durata SJ4    Implantable Lead Serial Number [id number]    Implantable Lead Implant Date [id number]    Implantable Lead Location [id number]    Implantable Lead Connection Status [phone]    Lead Channel Setting Sensing Sensitivity 0.5 mV   Lead Channel Setting Sensing Adaptation Mode Adaptive Sensing    Lead Channel Setting Pacing Amplitude 2.0 V   Lead Channel Setting Pacing Pulse Width 0.5 ms   Lead Channel Setting Pacing Amplitude 1.625    Lead Channel Setting Pacing Pulse Width 0.6 ms   Lead Channel Setting Pacing Amplitude 2.125    Lead Channel Setting Pacing Capture Mode Adaptive Capture    Zone Setting Status Active    Zone Setting Status Inactive    Zone Setting Status Active    Lead Channel Status NULL    Lead Channel Impedance Value 750 ohm   Lead Channel Pacing Threshold Amplitude 1.625 V   Lead Channel Pacing Threshold Pulse Width 0.6 ms   Lead Channel Status NULL    Lead Channel Impedance Value 380 ohm   Lead Channel Sensing Intrinsic Amplitude 2.8 mV   Lead Channel Pacing Threshold Amplitude 0.5 V   Lead Channel Pacing Threshold Pulse Width 0.5 ms   Lead Channel Status NULL    Lead Channel Impedance Value 410 ohm   Lead Channel Sensing Intrinsic Amplitude 11.8 mV   Lead Channel Pacing Threshold Amplitude 0.625 V   Lead Channel Pacing Threshold Pulse Width 0.5 ms   HighPow Impedance 72 ohm   HighPow Imped Status NULL    Battery Status MOS    Battery Remaining Longevity 72 mo   Battery Remaining Percentage 76.0 %   Battery Voltage 2.96 V   Brady Statistic RA Percent Paced 19.0 %   Brady Statistic AP VP Percent 20.0 %   Brady Statistic AS VP Percent 79.0 %   Brady Statistic AP VS Percent 1.0 %    Brady Statistic AS VS Percent 1.0 %  Glucose, capillary     Status: Abnormal   Collection Time: 10/23/22 12:59 PM  Result Value Ref Range   Glucose-Capillary 240 (H) 70 - 99 mg/dL    Comment: Glucose reference range applies only to samples taken after fasting for at least 8 hours.  Surgical pcr screen     Status: Abnormal   Collection Time: 10/23/22  1:24 PM   Specimen: Nasal Mucosa; Nasal Swab  Result Value Ref Range   MRSA, PCR NEGATIVE NEGATIVE   Staphylococcus aureus POSITIVE (A) NEGATIVE    Comment: (NOTE) The Xpert SA Assay (FDA approved for NASAL specimens in patients 20 years of age and older), is one component of a comprehensive surveillance program. It is not intended to diagnose infection nor to guide or monitor treatment. Performed at [facility], [address], [place], [place] [address]   Type and screen [facility]     Status: None   Collection Time: 10/23/22  1:35 PM  Result Value Ref Range   ABO/RH(D) A NEG  Antibody Screen NEG    Sample Expiration 11/06/2022,2359    Extend sample reason      NO TRANSFUSIONS OR PREGNANCY IN THE PAST 3 MONTHS Performed at [facility], [address], [place], [place] [address]   Hemoglobin A1c per protocol     Status: Abnormal   Collection Time: 10/23/22  1:39 PM  Result Value Ref Range   Hgb A1c MFr Bld 7.9 (H) 4.8 - 5.6 %    Comment: (NOTE) Pre diabetes:          5.7%-6.4%  Diabetes:              >6.4%  Glycemic control for   <7.0% adults with diabetes    Mean Plasma Glucose 180.03 mg/dL    Comment: Performed at [facility], [address], [place], [place] [address]  Basic metabolic panel per protocol     Status: Abnormal   Collection Time: 10/23/22  1:40 PM  Result Value Ref Range   Sodium 136 135 - 145 mmol/L    Comment: POST-ULTRACENTRIFUGATION   Potassium 4.7 3.5 - 5.1 mmol/L   Chloride 101 98 - 111 mmol/L   CO2 16 (L) 22 - 32 mmol/L   Glucose, Bld 277 (H) 70 - 99  mg/dL    Comment: Glucose reference range applies only to samples taken after fasting for at least 8 hours.   BUN 27 (H) 8 - 23 mg/dL   Creatinine, Ser 0.98 0.61 - 1.24 mg/dL   Calcium 9.3 8.9 - 11.9 mg/dL   GFR, Estimated >14 >78 mL/min    Comment: (NOTE) Calculated using the CKD-EPI Creatinine Equation (2021)    Anion gap 19 (H) 5 - 15    Comment: Performed at [facility], [address], [place], [place] [address]  CBC per protocol     Status: Abnormal   Collection Time: 10/23/22  1:40 PM  Result Value Ref Range   WBC 12.3 (H) 4.0 - 10.5 K/uL   RBC 4.14 (L) 4.22 - 5.81 MIL/uL   Hemoglobin 13.6 13.0 - 17.0 g/dL   HCT 13.0 86.5 - 78.4 %   MCV 96.4 80.0 - 100.0 fL   MCH 32.9 26.0 - 34.0 pg   MCHC 34.1 30.0 - 36.0 g/dL   RDW 69.6 29.5 - 28.4 %   Platelets 322 150 - 400 K/uL   nRBC 0.0 0.0 - 0.2 %    Comment: Performed at [facility], [address] [place], [place] [address]  Glucose, capillary     Status: Abnormal   Collection Time: 10/29/22  5:56 AM  Result Value Ref Range   Glucose-Capillary 149 (H) 70 - 99 mg/dL    Comment: Glucose reference range applies only to samples taken after fasting for at least 8 hours.  Glucose, capillary     Status: Abnormal   Collection Time: 10/29/22  7:58 AM  Result Value Ref Range   Glucose-Capillary 116 (H) 70 - 99 mg/dL    Comment: Glucose reference range applies only to samples taken after fasting for at least 8 hours.  Comprehensive metabolic panel     Status: Abnormal   Collection Time: 10/29/22  9:06 AM  Result Value Ref Range   Sodium 141 135 - 145 mmol/L   Potassium 4.4 3.5 - 5.1 mmol/L   Chloride 110 98 - 111 mmol/L   CO2 19 (L) 22 - 32 mmol/L   Glucose, Bld 107 (H) 70 - 99 mg/dL  Comment: Glucose reference range applies only to samples taken after fasting for at least 8 hours.   BUN 24 (H) 8 - 23 mg/dL   Creatinine, Ser 5.78 0.61 - 1.24 mg/dL   Calcium 9.2 8.9 - 46.9 mg/dL   Total Protein 6.6 6.5 - 8.1 g/dL    Albumin 3.5 3.5 - 5.0 g/dL   AST 27 15 - 41 U/L   ALT 21 0 - 44 U/L   Alkaline Phosphatase 83 38 - 126 U/L   Total Bilirubin 0.9 0.3 - 1.2 mg/dL   GFR, Estimated >62 >95 mL/min    Comment: (NOTE) Calculated using the CKD-EPI Creatinine Equation (2021)    Anion gap 12 5 - 15    Comment: Performed at [facility], [address] [place], [place] [address]  CBC with Differential/Platelet     Status: Abnormal   Collection Time: 10/29/22  9:06 AM  Result Value Ref Range   WBC 10.1 4.0 - 10.5 K/uL   RBC 4.06 (L) 4.22 - 5.81 MIL/uL   Hemoglobin 13.3 13.0 - 17.0 g/dL   HCT 24.4 01.0 - 27.2 %   MCV 98.5 80.0 - 100.0 fL   MCH 32.8 26.0 - 34.0 pg   MCHC 33.3 30.0 - 36.0 g/dL   RDW 53.6 64.4 - 03.4 %   Platelets 326 150 - 400 K/uL   nRBC 0.0 0.0 - 0.2 %   Neutrophils Relative % 75 %   Neutro Abs 7.6 1.7 - 7.7 K/uL   Lymphocytes Relative 11 %   Lymphs Abs 1.1 0.7 - 4.0 K/uL   Monocytes Relative 9 %   Monocytes Absolute 0.9 0.1 - 1.0 K/uL   Eosinophils Relative 3 %   Eosinophils Absolute 0.3 0.0 - 0.5 K/uL   Basophils Relative 1 %   Basophils Absolute 0.1 0.0 - 0.1 K/uL   Immature Granulocytes 1 %   Abs Immature Granulocytes 0.05 0.00 - 0.07 K/uL    Comment: Performed at [facility], [address], [place], [place] [address]  Brain natriuretic peptide     Status: Abnormal   Collection Time: 10/29/22  9:06 AM  Result Value Ref Range   B Natriuretic Peptide 174.0 (H) 0.0 - 100.0 pg/mL    Comment: Performed at [facility], [address], [place], [place] [address]  Troponin I (High Sensitivity)     Status: Abnormal   Collection Time: 10/29/22  9:06 AM  Result Value Ref Range   Troponin I (High Sensitivity) 64 (H) <18 ng/L    Comment: (NOTE) Elevated high sensitivity troponin I (hsTnI) values and significant  changes across serial measurements may suggest ACS but many other  chronic and acute conditions are known to elevate hsTnI results.  Refer to the  "Links" section for chest pain algorithms and additional  guidance. Performed at [facility], [address] [place], [place] [address]   Glucose, capillary     Status: Abnormal   Collection Time: 10/29/22 10:03 AM  Result Value Ref Range   Glucose-Capillary 110 (H) 70 - 99 mg/dL    Comment: Glucose reference range applies only to samples taken after fasting for at least 8 hours.  Glucose, capillary     Status: Abnormal   Collection Time: 10/29/22 12:08 PM  Result Value Ref Range   Glucose-Capillary 243 (H) 70 - 99 mg/dL    Comment: Glucose reference range applies only to samples taken after fasting for at least 8 hours.  Troponin I (High Sensitivity)     Status: Abnormal   Collection Time: 10/29/22  2:28 PM  Result Value Ref Range   Troponin I (High Sensitivity) 60 (H) <18 ng/L    Comment: (NOTE) Elevated high sensitivity troponin I (hsTnI) values and significant  changes across serial measurements may suggest ACS but many other  chronic and acute conditions are known to elevate hsTnI results.  Refer to the "Links" section for chest pain algorithms and additional  guidance. Performed at [facility], [address], [place], [place] [address]   Glucose, capillary     Status: Abnormal   Collection Time: 10/29/22  3:42 PM  Result Value Ref Range   Glucose-Capillary 156 (H) 70 - 99 mg/dL    Comment: Glucose reference range applies only to samples taken after fasting for at least 8 hours.  Glucose, capillary     Status: Abnormal   Collection Time: 10/29/22  9:24 PM  Result Value Ref Range   Glucose-Capillary 135 (H) 70 - 99 mg/dL    Comment: Glucose reference range applies only to samples taken after fasting for at least 8 hours.   Comment 1 Notify RN    Comment 2 Document in Chart   Basic metabolic panel     Status: Abnormal   Collection Time: 10/30/22  1:33 AM  Result Value Ref Range   Sodium 136 135 - 145 mmol/L   Potassium 4.1 3.5 - 5.1 mmol/L    Comment:  HEMOLYSIS AT THIS LEVEL MAY AFFECT RESULT   Chloride 101 98 - 111 mmol/L   CO2 21 (L) 22 - 32 mmol/L   Glucose, Bld 199 (H) 70 - 99 mg/dL    Comment: Glucose reference range applies only to samples taken after fasting for at least 8 hours.   BUN 24 (H) 8 - 23 mg/dL   Creatinine, Ser 2.13 (H) 0.61 - 1.24 mg/dL   Calcium 9.1 8.9 - 08.6 mg/dL   GFR, Estimated 48 (L) >60 mL/min    Comment: (NOTE) Calculated using the CKD-EPI Creatinine Equation (2021)    Anion gap 14 5 - 15    Comment: Performed at [facility], [address] [place], [place] [address]  CBC     Status: Abnormal   Collection Time: 10/30/22  1:33 AM  Result Value Ref Range   WBC 9.5 4.0 - 10.5 K/uL   RBC 4.03 (L) 4.22 - 5.81 MIL/uL   Hemoglobin 12.8 (L) 13.0 - 17.0 g/dL   HCT 96.2 (L) 95.2 - 84.1 %   MCV 95.3 80.0 - 100.0 fL   MCH 31.8 26.0 - 34.0 pg   MCHC 33.3 30.0 - 36.0 g/dL   RDW 32.4 40.1 - 02.7 %   Platelets 313 150 - 400 K/uL   nRBC 0.0 0.0 - 0.2 %    Comment: Performed at [facility], [address] [place], [place] [address]  Glucose, capillary     Status: Abnormal   Collection Time: 10/30/22  5:55 AM  Result Value Ref Range   Glucose-Capillary 126 (H) 70 - 99 mg/dL    Comment: Glucose reference range applies only to samples taken after fasting for at least 8 hours.   Comment 1 Notify RN    Comment 2 Document in Chart   Glucose, capillary     Status: Abnormal   Collection Time: 10/30/22 11:19 AM  Result Value Ref Range   Glucose-Capillary 125 (H) 70 - 99 mg/dL  Comment: Glucose reference range applies only to samples taken after fasting for at least 8 hours.  ECHOCARDIOGRAM COMPLETE     Status: None   Collection Time: 10/30/22  2:10 PM  Result Value Ref Range   Weight 2,912 oz   Height 74 in   BP 96/58 mmHg   S' Lateral 2.50 cm   Area-P 1/2 3.16 cm2   Est EF 60 - 65%   Glucose, capillary     Status: None   Collection Time: 10/30/22  4:17 PM  Result Value Ref Range    Glucose-Capillary 96 70 - 99 mg/dL    Comment: Glucose reference range applies only to samples taken after fasting for at least 8 hours.  Glucose, capillary     Status: Abnormal   Collection Time: 10/30/22  9:21 PM  Result Value Ref Range   Glucose-Capillary 195 (H) 70 - 99 mg/dL    Comment: Glucose reference range applies only to samples taken after fasting for at least 8 hours.   Comment 1 Notify RN    Comment 2 Document in Chart   Basic metabolic panel     Status: Abnormal   Collection Time: 10/31/22  1:41 AM  Result Value Ref Range   Sodium 137 135 - 145 mmol/L   Potassium 3.3 (L) 3.5 - 5.1 mmol/L   Chloride 104 98 - 111 mmol/L   CO2 23 22 - 32 mmol/L   Glucose, Bld 133 (H) 70 - 99 mg/dL    Comment: Glucose reference range applies only to samples taken after fasting for at least 8 hours.   BUN 25 (H) 8 - 23 mg/dL   Creatinine, Ser 9.60 (H) 0.61 - 1.24 mg/dL   Calcium 8.6 (L) 8.9 - 10.3 mg/dL   GFR, Estimated 54 (L) >60 mL/min    Comment: (NOTE) Calculated using the CKD-EPI Creatinine Equation (2021)    Anion gap 10 5 - 15    Comment: Performed at [facility], [address] [place], [place] [address]  CBC     Status: Abnormal   Collection Time: 10/31/22  1:41 AM  Result Value Ref Range   WBC 9.9 4.0 - 10.5 K/uL   RBC 3.66 (L) 4.22 - 5.81 MIL/uL   Hemoglobin 11.7 (L) 13.0 - 17.0 g/dL   HCT 81.1 (L) 91.4 - 78.2 %   MCV 96.4 80.0 - 100.0 fL   MCH 32.0 26.0 - 34.0 pg   MCHC 33.1 30.0 - 36.0 g/dL   RDW 95.6 21.3 - 08.6 %   Platelets 308 150 - 400 K/uL   nRBC 0.0 0.0 - 0.2 %    Comment: Performed at [facility], [address], [place], [place] [address]  Glucose, capillary     Status: Abnormal   Collection Time: 10/31/22  6:05 AM  Result Value Ref Range   Glucose-Capillary 69 (L) 70 - 99 mg/dL    Comment: Glucose reference range applies only to samples taken after fasting for at least 8 hours.   Comment 1 Notify RN    Comment 2 Document in Chart    Glucose, capillary     Status: None   Collection Time: 10/31/22  6:27 AM  Result Value Ref Range   Glucose-Capillary 94 70 - 99 mg/dL    Comment: Glucose reference range applies only to samples taken after fasting for at least 8 hours.   Comment 1 Notify RN    Comment 2 Document in Chart   Glucose,  capillary     Status: Abnormal   Collection Time: 10/31/22 11:29 AM  Result Value Ref Range   Glucose-Capillary 202 (H) 70 - 99 mg/dL    Comment: Glucose reference range applies only to samples taken after fasting for at least 8 hours.  Glucose, capillary     Status: Abnormal   Collection Time: 10/31/22  4:35 PM  Result Value Ref Range   Glucose-Capillary 155 (H) 70 - 99 mg/dL    Comment: Glucose reference range applies only to samples taken after fasting for at least 8 hours.  Glucose, capillary     Status: Abnormal   Collection Time: 10/31/22  9:06 PM  Result Value Ref Range   Glucose-Capillary 160 (H) 70 - 99 mg/dL    Comment: Glucose reference range applies only to samples taken after fasting for at least 8 hours.   Comment 1 Notify RN    Comment 2 Document in Chart   Basic metabolic panel     Status: Abnormal   Collection Time: 11/01/22  1:16 AM  Result Value Ref Range   Sodium 139 135 - 145 mmol/L   Potassium 3.8 3.5 - 5.1 mmol/L   Chloride 105 98 - 111 mmol/L   CO2 21 (L) 22 - 32 mmol/L   Glucose, Bld 187 (H) 70 - 99 mg/dL    Comment: Glucose reference range applies only to samples taken after fasting for at least 8 hours.   BUN 23 8 - 23 mg/dL   Creatinine, Ser 1.61 0.61 - 1.24 mg/dL   Calcium 9.1 8.9 - 09.6 mg/dL   GFR, Estimated >04 >54 mL/min    Comment: (NOTE) Calculated using the CKD-EPI Creatinine Equation (2021)    Anion gap 13 5 - 15    Comment: Performed at [facility], [address], [place], [place] [address]  CBC with Differential/Platelet     Status: Abnormal   Collection Time: 11/01/22  1:16 AM  Result Value Ref Range   WBC 9.7 4.0 - 10.5 K/uL    RBC 4.08 (L) 4.22 - 5.81 MIL/uL   Hemoglobin 13.1 13.0 - 17.0 g/dL   HCT 91.4 78.2 - 95.6 %   MCV 97.5 80.0 - 100.0 fL   MCH 32.1 26.0 - 34.0 pg   MCHC 32.9 30.0 - 36.0 g/dL   RDW 21.3 08.6 - 57.8 %   Platelets 318 150 - 400 K/uL   nRBC 0.0 0.0 - 0.2 %   Neutrophils Relative % 70 %   Neutro Abs 6.9 1.7 - 7.7 K/uL   Lymphocytes Relative 14 %   Lymphs Abs 1.3 0.7 - 4.0 K/uL   Monocytes Relative 9 %   Monocytes Absolute 0.9 0.1 - 1.0 K/uL   Eosinophils Relative 5 %   Eosinophils Absolute 0.4 0.0 - 0.5 K/uL   Basophils Relative 1 %   Basophils Absolute 0.1 0.0 - 0.1 K/uL   Immature Granulocytes 1 %   Abs Immature Granulocytes 0.06 0.00 - 0.07 K/uL    Comment: Performed at [facility], [address], [place], [place] [address]  Lipid panel     Status: Abnormal   Collection Time: 11/01/22  1:16 AM  Result Value Ref Range   Cholesterol 125 0 - 200 mg/dL   Triglycerides 952 (H) <150 mg/dL   HDL 31 (L) >84 mg/dL   Total CHOL/HDL Ratio 4.0 RATIO   VLDL 43 (H) 0 - 40 mg/dL   LDL Cholesterol 51 0 - 99 mg/dL  Comment:        Total Cholesterol/HDL:CHD Risk Coronary Heart Disease Risk Table                     Men   Women  1/2 Average Risk   3.4   3.3  Average Risk       5.0   4.4  2 X Average Risk   9.6   7.1  3 X Average Risk  23.4   11.0        Use the calculated Patient Ratio above and the CHD Risk Table to determine the patient's CHD Risk.        ATP III CLASSIFICATION (LDL):  <100     mg/dL   Optimal  528-413  mg/dL   Near or Above                    Optimal  130-159  mg/dL   Borderline  244-010  mg/dL   High  >272     mg/dL   Very High Performed at [facility], [address], [place], [place] [address]   LDL cholesterol, direct     Status: None   Collection Time: 11/01/22  1:16 AM  Result Value Ref Range   Direct LDL 53 0 - 99 mg/dL    Comment: Performed at [facility], [address] [place], [place] [address]  Glucose, capillary      Status: Abnormal   Collection Time: 11/01/22  5:59 AM  Result Value Ref Range   Glucose-Capillary 133 (H) 70 - 99 mg/dL    Comment: Glucose reference range applies only to samples taken after fasting for at least 8 hours.   Comment 1 Notify RN    Comment 2 Document in Chart   Glucose, capillary     Status: Abnormal   Collection Time: 11/01/22 11:55 AM  Result Value Ref Range   Glucose-Capillary 198 (H) 70 - 99 mg/dL    Comment: Glucose reference range applies only to samples taken after fasting for at least 8 hours.  CBC with Differential/Platelet     Status: Abnormal   Collection Time: 11/08/22  2:50 PM  Result Value Ref Range   WBC 15.1 (H) 3.8 - 10.8 Thousand/uL   RBC 3.94 (L) 4.20 - 5.80 Million/uL   Hemoglobin 12.9 (L) 13.2 - 17.1 g/dL   HCT 42.5 95.6 - 38.7 %   MCV 98.5 80.0 - 100.0 fL   MCH 32.7 27.0 - 33.0 pg   MCHC 33.2 32.0 - 36.0 g/dL   RDW 56.4 33.2 - 95.1 %   Platelets 399 140 - 400 Thousand/uL   MPV 9.9 7.5 - 12.5 fL   Neutro Abs 11,582 (H) 1,500 - 7,800 cells/uL   Lymphs Abs 1,752 850 - 3,900 cells/uL   Absolute Monocytes 1,193 (H) 200 - 950 cells/uL   Eosinophils Absolute 423 15 - 500 cells/uL   Basophils Absolute 151 0 - 200 cells/uL   Neutrophils Relative % 76.7 %   Total Lymphocyte 11.6 %   Monocytes Relative 7.9 %   Eosinophils Relative 2.8 %   Basophils Relative 1.0 %  Comprehensive metabolic panel     Status: Abnormal   Collection Time: 11/08/22  2:50 PM  Result Value Ref Range   Glucose, Bld 187 (H) 65 - 99 mg/dL    Comment: .            Fasting reference interval . For someone  without known diabetes, a glucose value >125 mg/dL indicates that they may have diabetes and this should be confirmed with a follow-up test. .    BUN 31 (H) 7 - 25 mg/dL   Creat 4.01 (H) 0.27 - 1.28 mg/dL   BUN/Creatinine Ratio 22 6 - 22 (calc)   Sodium 137 135 - 146 mmol/L   Potassium 5.0 3.5 - 5.3 mmol/L   Chloride 99 98 - 110 mmol/L   CO2 21 20 - 32 mmol/L    Calcium 9.9 8.6 - 10.3 mg/dL   Total Protein 6.8 6.1 - 8.1 g/dL   Albumin 3.9 3.6 - 5.1 g/dL   Globulin 2.9 1.9 - 3.7 g/dL (calc)   AG Ratio 1.3 1.0 - 2.5 (calc)   Total Bilirubin 0.5 0.2 - 1.2 mg/dL   Alkaline phosphatase (APISO) 87 35 - 144 U/L   AST 21 10 - 35 U/L   ALT 19 9 - 46 U/L  Glucose, capillary     Status: Abnormal   Collection Time: 11/19/22  7:09 AM  Result Value Ref Range   Glucose-Capillary 124 (H) 70 - 99 mg/dL    Comment: Glucose reference range applies only to samples taken after fasting for at least 8 hours.  Type and screen     Status: None   Collection Time: 11/19/22  7:25 AM  Result Value Ref Range   ABO/RH(D) A NEG    Antibody Screen NEG    Sample Expiration      11/22/2022,2359 Performed at [facility], [address] [place], [place] [address]   Urinalysis, Routine w reflex microscopic -Urine, Clean Catch     Status: None   Collection Time: 11/19/22  7:55 AM  Result Value Ref Range   Color, Urine YELLOW YELLOW   APPearance CLEAR CLEAR   Specific Gravity, Urine 1.020 1.005 - 1.030   pH 5.5 5.0 - 8.0   Glucose, UA NEGATIVE NEGATIVE mg/dL   Hgb urine dipstick NEGATIVE NEGATIVE   Bilirubin Urine NEGATIVE NEGATIVE   Ketones, ur NEGATIVE NEGATIVE mg/dL   Protein, ur NEGATIVE NEGATIVE mg/dL   Nitrite NEGATIVE NEGATIVE   Leukocytes,Ua NEGATIVE NEGATIVE    Comment: Microscopic not done on urines with negative protein, blood, leukocytes, nitrite, or glucose < 500 mg/dL. Performed at [facility], [address], [place], [place] [address]   Glucose, capillary     Status: Abnormal   Collection Time: 11/19/22  9:16 AM  Result Value Ref Range   Glucose-Capillary 181 (H) 70 - 99 mg/dL    Comment: Glucose reference range applies only to samples taken after fasting for at least 8 hours.  POCT Activated clotting time     Status: None   Collection Time: 11/19/22  9:59 AM  Result Value Ref Range   Activated Clotting Time 220 seconds    Comment:  Reference range 74-137 seconds for patients not on anticoagulant therapy.  POCT Activated clotting time     Status: None   Collection Time: 11/19/22 10:38 AM  Result Value Ref Range   Activated Clotting Time 244 seconds    Comment: Reference range 74-137 seconds for patients not on anticoagulant therapy.  Glucose, capillary     Status: Abnormal   Collection Time: 11/19/22 11:02 AM  Result Value Ref Range   Glucose-Capillary 188 (H) 70 - 99 mg/dL    Comment: Glucose reference range applies only to samples taken after fasting for at least 8 hours.  POCT Activated clotting time  Status: None   Collection Time: 11/19/22 11:04 AM  Result Value Ref Range   Activated Clotting Time 171 seconds    Comment: Reference range 74-137 seconds for patients not on anticoagulant therapy.  Glucose, capillary     Status: Abnormal   Collection Time: 11/19/22 11:42 AM  Result Value Ref Range   Glucose-Capillary 174 (H) 70 - 99 mg/dL    Comment: Glucose reference range applies only to samples taken after fasting for at least 8 hours.  CBC     Status: Abnormal   Collection Time: 11/19/22 12:45 PM  Result Value Ref Range   WBC 11.7 (H) 4.0 - 10.5 K/uL   RBC 3.67 (L) 4.22 - 5.81 MIL/uL   Hemoglobin 12.1 (L) 13.0 - 17.0 g/dL   HCT 02.5 (L) 42.7 - 06.2 %   MCV 98.4 80.0 - 100.0 fL   MCH 33.0 26.0 - 34.0 pg   MCHC 33.5 30.0 - 36.0 g/dL   RDW 37.6 28.3 - 15.1 %   Platelets 293 150 - 400 K/uL   nRBC 0.0 0.0 - 0.2 %    Comment: Performed at [facility], [address], [place], [place] [address]  Creatinine, serum     Status: None   Collection Time: 11/19/22 12:45 PM  Result Value Ref Range   Creatinine, Ser 1.12 0.61 - 1.24 mg/dL   GFR, Estimated >73 >71 mL/min    Comment: (NOTE) Calculated using the CKD-EPI Creatinine Equation (2021) Performed at [facility], [address] [place], [place] [address]   Glucose, capillary     Status: Abnormal   Collection Time: 11/19/22  1:40 PM   Result Value Ref Range   Glucose-Capillary 147 (H) 70 - 99 mg/dL    Comment: Glucose reference range applies only to samples taken after fasting for at least 8 hours.   Comment 1 Notify RN    Comment 2 Document in Chart   Glucose, capillary     Status: Abnormal   Collection Time: 11/19/22  4:27 PM  Result Value Ref Range   Glucose-Capillary 176 (H) 70 - 99 mg/dL    Comment: Glucose reference range applies only to samples taken after fasting for at least 8 hours.   Comment 1 Notify RN    Comment 2 Document in Chart   Glucose, capillary     Status: Abnormal   Collection Time: 11/19/22  9:32 PM  Result Value Ref Range   Glucose-Capillary 400 (H) 70 - 99 mg/dL    Comment: Glucose reference range applies only to samples taken after fasting for at least 8 hours.   Comment 1 Notify RN    Comment 2 Document in Chart   Glucose, capillary     Status: Abnormal   Collection Time: 11/19/22 11:43 PM  Result Value Ref Range   Glucose-Capillary 357 (H) 70 - 99 mg/dL    Comment: Glucose reference range applies only to samples taken after fasting for at least 8 hours.   Comment 1 Notify RN    Comment 2 Document in Chart   CBC     Status: Abnormal   Collection Time: 11/20/22 12:33 AM  Result Value Ref Range   WBC 11.4 (H) 4.0 - 10.5 K/uL   RBC 3.36 (L) 4.22 - 5.81 MIL/uL   Hemoglobin 10.7 (L) 13.0 - 17.0 g/dL   HCT 48.5 (L) 46.2 - 70.3 %   MCV 95.8 80.0 - 100.0 fL   MCH 31.8 26.0 - 34.0 pg  MCHC 33.2 30.0 - 36.0 g/dL   RDW 78.2 95.6 - 21.3 %   Platelets 282 150 - 400 K/uL   nRBC 0.0 0.0 - 0.2 %    Comment: Performed at [facility], [address] [place], [place] [address]  Basic metabolic panel     Status: Abnormal   Collection Time: 11/20/22 12:33 AM  Result Value Ref Range   Sodium 135 135 - 145 mmol/L   Potassium 3.9 3.5 - 5.1 mmol/L   Chloride 97 (L) 98 - 111 mmol/L   CO2 23 22 - 32 mmol/L   Glucose, Bld 370 (H) 70 - 99 mg/dL    Comment: Glucose reference range applies  only to samples taken after fasting for at least 8 hours.   BUN 23 8 - 23 mg/dL   Creatinine, Ser 8.46 0.61 - 1.24 mg/dL   Calcium 8.9 8.9 - 96.2 mg/dL   GFR, Estimated >95 >28 mL/min    Comment: (NOTE) Calculated using the CKD-EPI Creatinine Equation (2021)    Anion gap 15 5 - 15    Comment: Performed at [facility], [address] [place], [place] [address]  Lipid panel     Status: Abnormal   Collection Time: 11/20/22 12:33 AM  Result Value Ref Range   Cholesterol 110 0 - 200 mg/dL   Triglycerides 401 (H) <150 mg/dL   HDL 31 (L) >02 mg/dL   Total CHOL/HDL Ratio 3.5 RATIO   VLDL 34 0 - 40 mg/dL   LDL Cholesterol 45 0 - 99 mg/dL    Comment:        Total Cholesterol/HDL:CHD Risk Coronary Heart Disease Risk Table                     Men   Women  1/2 Average Risk   3.4   3.3  Average Risk       5.0   4.4  2 X Average Risk   9.6   7.1  3 X Average Risk  23.4   11.0        Use the calculated Patient Ratio above and the CHD Risk Table to determine the patient's CHD Risk.        ATP III CLASSIFICATION (LDL):  <100     mg/dL   Optimal  725-366  mg/dL   Near or Above                    Optimal  130-159  mg/dL   Borderline  440-347  mg/dL   High  >425     mg/dL   Very High Performed at [facility], [address], [place], [place] [address]   Glucose, capillary     Status: Abnormal   Collection Time: 11/20/22  6:03 AM  Result Value Ref Range   Glucose-Capillary 243 (H) 70 - 99 mg/dL    Comment: Glucose reference range applies only to samples taken after fasting for at least 8 hours.   Comment 1 Notify RN    Comment 2 Document in Chart   Glucose, capillary     Status: Abnormal   Collection Time: 11/20/22 11:15 AM  Result Value Ref Range   Glucose-Capillary 223 (H) 70 - 99 mg/dL    Comment: Glucose reference range applies only to samples taken after fasting for at least 8 hours.  Glucose, capillary     Status: Abnormal   Collection Time: 11/20/22  4:08 PM  Result Value Ref Range   Glucose-Capillary 151 (H) 70 - 99 mg/dL    Comment: Glucose reference range applies only to samples taken after fasting for at least 8 hours.  Glucose, capillary     Status: Abnormal   Collection Time: 11/20/22  9:12 PM  Result Value Ref Range   Glucose-Capillary 239 (H) 70 - 99 mg/dL    Comment: Glucose reference range applies only to samples taken after fasting for at least 8 hours.   Comment 1 Notify RN    Comment 2 Document in Chart   Glucose, capillary     Status: Abnormal   Collection Time: 11/21/22  5:56 AM  Result Value Ref Range   Glucose-Capillary 166 (H) 70 - 99 mg/dL    Comment: Glucose reference range applies only to samples taken after fasting for at least 8 hours.   Comment 1 Notify RN    Comment 2 Document in Chart   Glucose, capillary     Status: Abnormal   Collection Time: 11/21/22 11:10 AM  Result Value Ref Range   Glucose-Capillary 329 (H) 70 - 99 mg/dL    Comment: Glucose reference range applies only to samples taken after fasting for at least 8 hours.  Pulmonary function test     Status: None (Preliminary result)   Collection Time: 01/09/23 11:01 AM  Result Value Ref Range   FVC-Pre 3.76 L   FVC-%Pred-Pre 82 %   FEV1-Pre 2.48 L   FEV1-%Pred-Pre 75 %   FEV6-Pre 3.60 L   FEV6-%Pred-Pre 84 %   Pre FEV1/FVC ratio 66 %   FEV1FVC-%Pred-Pre 91 %   Pre FEV6/FVC Ratio 96 %   FEV6FVC-%Pred-Pre 101 %   FEF 25-75 Pre 1.25 L/sec   FEF2575-%Pred-Pre 52 %   RV 2.26 L   RV % pred 84 %   TLC 6.05 L   TLC % pred 81 %   DLCO unc 6.86 ml/min/mmHg   DLCO unc % pred 25 %   DL/VA 7.82 ml/min/mmHg/L   DL/VA % pred 35 %         has a past medical history of AAA (abdominal aortic aneurysm) (HCC) (2011), Atrial fibrillation (HCC), CAD (coronary artery disease), Cardiomyopathy (HCC), Chronic kidney disease, COVID-19, Diabetes mellitus, GERD (gastroesophageal reflux disease), HLD (hyperlipidemia), Hypertension, ICD (implantable  cardioverter-defibrillator) in place, IPF (idiopathic pulmonary fibrosis) (HCC), LBBB (left bundle branch block), Low testosterone, On home oxygen therapy, Pacemaker, PAD (peripheral artery disease) (HCC), Pancreatitis, and Umbilical hernia.   reports that he quit smoking about 18 years ago. His smoking use included cigarettes. He started smoking about 60 years ago. He has a 84 pack-year smoking history. He has never been exposed to tobacco smoke. He has never used smokeless tobacco.  Past Surgical History:  Procedure Laterality Date   ABDOMINAL AORTAGRAM N/A 10/28/2011   Procedure: ABDOMINAL Ronny Flurry;  Surgeon: [name], MD;  Location: [facility] CATH LAB;  Service: Cardiovascular;  Laterality: N/A;   ABDOMINAL AORTIC ANEURYSM REPAIR     EVAR    ABDOMINAL AORTOGRAM N/A 05/27/2022   Procedure: ABDOMINAL AORTOGRAM;  Surgeon: Maeola Harman, MD;  Location: [facility] INVASIVE CV LAB;  Service: Cardiovascular;  Laterality: N/A;   BI-VENTRICULAR IMPLANTABLE CARDIOVERTER DEFIBRILLATOR N/A 07/16/2012   Procedure: BI-VENTRICULAR IMPLANTABLE CARDIOVERTER DEFIBRILLATOR  (CRT-D);  Surgeon: Duke Salvia, MD;  Location: [facility] CATH LAB;  Service: Cardiovascular;  Laterality: N/A;   BIV ICD GENERATOR CHANGEOUT N/A 10/25/2020   Procedure: BIV ICD GENERATOR CHANGEOUT;  Surgeon: Duke Salvia,  MD;  Location: [facility] INVASIVE CV LAB;  Service: Cardiovascular;  Laterality: N/A;   CARDIAC CATHETERIZATION     CATARACT EXTRACTION Left 2023   COLONOSCOPY  03/23/2018; 2020   CORONARY CTO INTERVENTION N/A 10/10/2022   Procedure: CORONARY CTO INTERVENTION;  Surgeon: Corky Crafts, MD;  Location: [facility] INVASIVE CV LAB;  Service: Cardiovascular;  Laterality: N/A;   CORONARY LITHOTRIPSY N/A 09/18/2022   Procedure: CORONARY LITHOTRIPSY;  Surgeon: Corky Crafts, MD;  Location: [facility] INVASIVE CV LAB;  Service: Cardiovascular;  Laterality: N/A;   CORONARY STENT INTERVENTION N/A 09/18/2022   Procedure: CORONARY STENT  INTERVENTION;  Surgeon: Corky Crafts, MD;  Location: [facility] INVASIVE CV LAB;  Service: Cardiovascular;  Laterality: N/A;   CORONARY ULTRASOUND/IVUS N/A 09/18/2022   Procedure: Coronary Ultrasound/IVUS;  Surgeon: Corky Crafts, MD;  Location: [facility] INVASIVE CV LAB;  Service: Cardiovascular;  Laterality: N/A;   ENDARTERECTOMY FEMORAL Right 11/19/2022   Procedure: RIGHT COMMON FEMORAL ENDARTERECTOMY WITH VEIN PATCH ANGIOPLASTY;  Surgeon: Maeola Harman, MD;  Location: [facility] OR;  Service: Vascular;  Laterality: Right;   ESOPHAGOGASTRODUODENOSCOPY N/A 02/12/2019   Procedure: ESOPHAGOGASTRODUODENOSCOPY (EGD);  Surgeon: Lynann Bologna, MD;  Location: [facility] ENDOSCOPY;  Service: Endoscopy;  Laterality: N/A;   ESOPHAGOGASTRODUODENOSCOPY (EGD) WITH PROPOFOL N/A 02/05/2018   Procedure: ESOPHAGOGASTRODUODENOSCOPY (EGD) WITH PROPOFOL;  Surgeon: Napoleon Form, MD;  Location: [facility] ENDOSCOPY;  Service: Endoscopy;  Laterality: N/A;   ESOPHAGOGASTRODUODENOSCOPY (EGD) WITH PROPOFOL N/A 09/27/2018   Procedure: ESOPHAGOGASTRODUODENOSCOPY (EGD) WITH PROPOFOL;  Surgeon: Jeani Hawking, MD;  Location: [facility] ENDOSCOPY;  Service: Endoscopy;  Laterality: N/A;   FOREIGN BODY REMOVAL  09/27/2018   Procedure: FOREIGN BODY REMOVAL;  Surgeon: Jeani Hawking, MD;  Location: [facility] ENDOSCOPY;  Service: Endoscopy;;   FOREIGN BODY REMOVAL  02/12/2019   Procedure: FOREIGN BODY REMOVAL;  Surgeon: Lynann Bologna, MD;  Location: [facility] ENDOSCOPY;  Service: Endoscopy;;   HERNIA REPAIR  05/14/2013   INSERTION OF MESH N/A 05/14/2013   Procedure: INSERTION OF MESH;  Surgeon: Ardeth Sportsman, MD;  Location: [facility] OR;  Service: General;  Laterality: N/A;   LEFT HEART CATH N/A 10/10/2022   Procedure: Left Heart Cath;  Surgeon: Corky Crafts, MD;  Location: [facility] INVASIVE CV LAB;  Service: Cardiovascular;  Laterality: N/A;   LEFT HEART CATH AND CORONARY ANGIOGRAPHY N/A 07/17/2021   Procedure: LEFT HEART CATH AND CORONARY ANGIOGRAPHY;  Surgeon:  Lennette Bihari, MD;  Location: [facility] INVASIVE CV LAB;  Service: Cardiovascular;  Laterality: N/A;   LEFT HEART CATH AND CORONARY ANGIOGRAPHY N/A 09/18/2022   Procedure: LEFT HEART CATH AND CORONARY ANGIOGRAPHY;  Surgeon: Corky Crafts, MD;  Location: [facility] INVASIVE CV LAB;  Service: Cardiovascular;  Laterality: N/A;   LOWER EXTREMITY ANGIOGRAM Bilateral 10/28/2011   Procedure: LOWER EXTREMITY ANGIOGRAM;  Surgeon: [name], MD;  Location: [facility] CATH LAB;  Service: Cardiovascular;  Laterality: Bilateral;   LOWER EXTREMITY ANGIOGRAM Right 11/19/2022   Procedure: RIGHT LOWER EXTREMITY ANGIOGRAM;  Surgeon: Maeola Harman, MD;  Location[facility] OR;  Service: Vascular;  Laterality: Right;   LOWER EXTREMITY ANGIOGRAPHY  05/27/2022   Procedure: Lower Extremity Angiography;  Surgeon: Maeola Harman, MD;  Location: [facility] INVASIVE CV LAB;  Service: Cardiovascular;;   PACEMAKER INSERTION  07/16/2012   pacemaker/defibrilator   PATCH ANGIOPLASTY Right 11/19/2022   Procedure: VEIN PATCH ANGIOPLASTY;  Surgeon: Maeola Harman, MD;  Location: [facility] OR;  Service: Vascular;  Laterality: Right;   POLYPECTOMY     POSTERIOR CERVICAL FUSION/FORAMINOTOMY N/A 06/09/2014  Procedure: Laminectomy - Cervical two-Cervcial four posterior cervical instrumented fusion Cervical two-cervical four;  Surgeon: Tia Alert, MD;  Location: [facility] NEURO ORS;  Service: Neurosurgery;  Laterality: N/A;  posterior    SAVORY DILATION N/A 02/05/2018   Procedure: SAVORY DILATION;  Surgeon: Napoleon Form, MD;  Location: [facility] ENDOSCOPY;  Service: Endoscopy;  Laterality: N/A;   TONSILLECTOMY     as a child    UMBILICAL HERNIA REPAIR N/A 05/14/2013   Procedure: LAPAROSCOPIC exploration and repair of hernia in abdominal ;  Surgeon: Ardeth Sportsman, MD;  Location: [facility] OR;  Service: General;  Laterality: N/A;   UPPER GASTROINTESTINAL ENDOSCOPY  2020    Allergies  Allergen Reactions   Aldactone [Spironolactone]  Other (See Comments)    Hyperkalemia   Brilinta [Ticagrelor] Shortness Of Breath    Numbness in hands and feet Blurry vision   Clopidogrel Rash    Redness and Itchiness   Codeine Rash and Hives   Amoxicillin Hives   Atorvastatin     Myalgias with lipitor.  Does tolerate simvastatin.     Benadryl [Diphenhydramine] Hives   Januvia [Sitagliptin] Other (See Comments)    Diarrhea and heart racing   Jardiance [Empagliflozin] Other (See Comments)    Polyuria; excessive weight loss   Lisinopril     Possible cause of pancreatitis   Rosuvastatin Other (See Comments)    myalgia    Immunization History  Administered Date(s) Administered   Fluad Quad(high Dose 65+) 02/23/2020, 04/13/2021   Influenza Split 01/31/2011, 02/11/2012   Influenza Whole 02/27/2010   Influenza, High Dose Seasonal PF 02/08/2022   Influenza,inj,Quad PF,6+ Mos 03/18/2013, 03/25/2014, 01/19/2015, 02/21/2016, 02/27/2017, 02/12/2018, 02/18/2019   Influenza-Unspecified 03/03/2020   PFIZER(Purple Top)SARS-COV-2 Vaccination 05/23/2019, 06/12/2019, 02/08/2020, 10/09/2020   Pfizer Covid-19 Vaccine Bivalent Booster 30yrs & up 08/02/2021   Pneumococcal Conjugate-13 10/18/2014   Pneumococcal Polysaccharide-23 11/14/2011   Td 07/05/2010    Family History  Problem Relation Age of Onset   Hypertension Mother    Stroke Mother    Hyperlipidemia Mother    Lung cancer Father    Diabetes Sister    Heart disease Sister        Before age 86   Hypertension Sister    Hyperlipidemia Sister    Heart attack Sister    Hypertension Son    Colon cancer Neg Hx    Prostate cancer Neg Hx    Esophageal cancer Neg Hx    Stomach cancer Neg Hx    Rectal cancer Neg Hx    Colon polyps Neg Hx    Pancreatic cancer Neg Hx      Current Outpatient Medications:    albuterol (VENTOLIN HFA) 108 (90 Base) MCG/ACT inhaler, Inhale 2 puffs into the lungs every 6 (six) hours as needed for wheezing or shortness of breath., Disp: 8 g, Rfl: 6    amLODipine (NORVASC) 5 MG tablet, Take 1 tablet (5 mg total) by mouth daily., Disp: 180 tablet, Rfl: 3   aspirin EC 81 MG tablet, Take 1 tablet (81 mg total) by mouth daily., Disp: 120 tablet, Rfl: 2   carvedilol (COREG) 25 MG tablet, TAKE 1 TABLET BY MOUTH TWICE DAILY WITH MEALS, Disp: 180 tablet, Rfl: 3   Cholecalciferol (VITAMIN D3) 50 MCG (2000 UT) TABS, Take 2,000 Units by mouth in the morning., Disp: , Rfl:    Cyanocobalamin (VITAMIN B-12 PO), Take 2,000 mcg by mouth in the morning., Disp: , Rfl:    furosemide (LASIX) 40 MG tablet, Take  1 tablet (40 mg total) by mouth daily., Disp: 90 tablet, Rfl: 3   glipiZIDE (GLUCOTROL) 5 MG tablet, Take 1 tablet (5 mg total) by mouth 2 (two) times daily before a meal., Disp: , Rfl:    glucose blood (ACCU-CHEK AVIVA PLUS) test strip, AS DIRECTED TO CHECK BLOOD SUGAR TWICE DAILY, Disp: 200 strip, Rfl: 3   insulin glargine (LANTUS SOLOSTAR) 100 UNIT/ML Solostar Pen, INJECT 0.3 TO 0.35 MLS(30 TO 35 UNITS) INTO THE SKIN EVERY DAY, Disp: 15 mL, Rfl: 3   isosorbide mononitrate (IMDUR) 30 MG 24 hr tablet, Take 1 tablet (30 mg total) by mouth daily., Disp: 180 tablet, Rfl: 3   metFORMIN (GLUCOPHAGE) 500 MG tablet, TAKE 2 TABLETS BY MOUTH TWICE DAILY WITH FOOD, Disp: 360 tablet, Rfl: 3   Multiple Vitamin (MULTIVITAMIN WITH MINERALS) TABS tablet, Take 1 tablet by mouth in the morning., Disp: , Rfl:    pantoprazole (PROTONIX) 40 MG tablet, Take 1 tablet (40 mg total) by mouth daily., Disp: , Rfl:    prasugrel (EFFIENT) 10 MG TABS tablet, Take 1 tablet (10 mg total) by mouth daily., Disp: 90 tablet, Rfl: 1   simvastatin (ZOCOR) 20 MG tablet, Take 1 tablet (20 mg total) by mouth at bedtime., Disp: 90 tablet, Rfl: 3   SSD 1 % cream, APPLY TOPICALLY TO THE AFFECTED AREA DAILY, Disp: 50 g, Rfl: 1   umeclidinium bromide (INCRUSE ELLIPTA) 62.5 MCG/ACT AEPB, Inhale 1 puff into the lungs daily., Disp: 90 each, Rfl: 3   valsartan (DIOVAN) 40 MG tablet, Take 0.5 tablets (20 mg  total) by mouth daily with supper., Disp: 45 tablet, Rfl: 1      Objective:   Vitals:   01/09/23 1330  BP: 104/60  Pulse: 84  Temp: (!) 97.3 F (36.3 C)  TempSrc: Temporal  SpO2: 93%  Weight: 176 lb (79.8 kg)  Height: 6' (1.829 m)    Estimated body mass index is 23.87 kg/m as calculated from the following:   Height as of this encounter: 6' (1.829 m).   Weight as of this encounter: 176 lb (79.8 kg).  @WEIGHTCHANGE @  [employer]   01/09/23 1330  Weight: 176 lb (79.8 kg)     Physical Exam   General: No distress. Looks sam O2 at rest: no Cane present: no Sitting in wheel chair: no Frail: no Obese: no Neuro: Alert and Oriented x 3. GCS 15. Speech normal Psych: Pleasant Resp:  Barrel Chest - no.  Wheeze - no, Crackles - yes, No overt respiratory distress CVS: Normal heart sounds. Murmurs - no Ext: Stigmata of Connective Tissue Disease - no HEENT: Normal upper airway. PEERL +. No post nasal drip        Assessment:       ICD-10-CM   1. IPF (idiopathic pulmonary fibrosis) (HCC)  J84.112 CBC with Differential/Platelet    Basic Metabolic Panel (BMET)    Hepatic function panel    B Nat Peptide    2. Centrilobular emphysema (HCC)  J43.2 CBC with Differential/Platelet    Basic Metabolic Panel (BMET)    Hepatic function panel    B Nat Peptide    3. Medication monitoring encounter  Z51.81 CBC with Differential/Platelet    Basic Metabolic Panel (BMET)    Hepatic function panel    B Nat Peptide    4. Exercise hypoxemia  R09.02 CBC with Differential/Platelet    Basic Metabolic Panel (BMET)    Hepatic function panel    B Nat Peptide  5. History of CHF (congestive heart failure)  Z86.79 CBC with Differential/Platelet    Basic Metabolic Panel (BMET)    Hepatic function panel    B Nat Peptide    6. Shortness of breath  R06.02 CBC with Differential/Platelet    Basic Metabolic Panel (BMET)    Hepatic function panel    B Nat Peptide         Plan:      Patient Instructions     ICD-10-CM   1. IPF (idiopathic pulmonary fibrosis) (HCC)  J84.112     2. Centrilobular emphysema (HCC)  J43.2     3. Medication monitoring encounter  Z51.81     4. Exercise hypoxemia  R09.02          Re IPF  -Concerned you are getting worse because of worsening shortness of breath, pulmonary function test and new exercise hypoxemia.  Alternatively could be heart failure   Plan - cotninue esbriet  at 2 pill thress times daily; take with food -Check blood work for CBC, chemistry, liver function test and BNP 01/09/2023 -Do high-resolution CT chest within the next 1 to 2 weeks  -Supine and prone -If indeed pulmonary fibrosis is getting worse then clinical trials as a care option is the next best option -Take handicap placard -Start portable oxygen system 2 L nasal cannula - Continue nighttime oxygen as before   Emphysema (minor component)   Stable  Plan -continue spiriva respimat 2 puff daily  - continue  albuterol prn  Lung nodules right - cluster - new Aug 2023 - resolved NOv 2023  Plan  -reassure   Followup -  --  video visit Dr. Marchelle Gearing [ideally] or nurse practitioner in the next 2 weeks but after completing blood work and CT   FOLLOWUP Return in about 2 weeks (around 01/23/2023) for 30 min visit, ILD, with Dr Marchelle Gearing, VIDEO VISIT.    SIGNATURE    Dr. Kalman Shan, M.D., F.C.C.P,  Pulmonary and Critical Care Medicine Staff Physician, [facility] Center Director - Interstitial Lung Disease  Program  Pulmonary Fibrosis [facility] [place], [place], [address]  Pager: [phone], If no answer or between  15:00h - 7:00h: call [phone] Telephone: [phone]  2:15 PM 01/09/2023

## 2023-01-09 NOTE — Telephone Encounter (Signed)
Pt. Responding to you!  

## 2023-01-13 ENCOUNTER — Ambulatory Visit (HOSPITAL_COMMUNITY)
Admission: RE | Admit: 2023-01-13 | Discharge: 2023-01-13 | Disposition: A | Discharge status: Home / Self Care | Payer: Medicare Other | Source: Ambulatory Visit | Attending: Surgery | Admitting: Surgery

## 2023-01-13 DIAGNOSIS — I739 Peripheral vascular disease, unspecified: Secondary | ICD-10-CM | POA: Diagnosis not present

## 2023-01-13 LAB — VAS US ABI WITH/WO TBI
Left ABI: 0.9
Right ABI: 0.58

## 2023-01-14 DIAGNOSIS — E113591 Type 2 diabetes mellitus with proliferative diabetic retinopathy without macular edema, right eye: Secondary | ICD-10-CM | POA: Diagnosis not present

## 2023-01-20 ENCOUNTER — Ambulatory Visit (INDEPENDENT_AMBULATORY_CARE_PROVIDER_SITE_OTHER): Payer: Medicare Other | Admitting: Podiatry

## 2023-01-20 ENCOUNTER — Encounter: Payer: Self-pay | Admitting: Podiatry

## 2023-01-20 ENCOUNTER — Ambulatory Visit
Admission: RE | Admit: 2023-01-20 | Discharge: 2023-01-20 | Disposition: A | Discharge status: Home / Self Care | Payer: Medicare Other | Source: Ambulatory Visit | Attending: Internal Medicine | Admitting: Internal Medicine

## 2023-01-20 DIAGNOSIS — R0602 Shortness of breath: Secondary | ICD-10-CM | POA: Diagnosis not present

## 2023-01-20 DIAGNOSIS — J8410 Pulmonary fibrosis, unspecified: Secondary | ICD-10-CM | POA: Diagnosis not present

## 2023-01-20 DIAGNOSIS — J84112 Idiopathic pulmonary fibrosis: Secondary | ICD-10-CM

## 2023-01-20 DIAGNOSIS — L97502 Non-pressure chronic ulcer of other part of unspecified foot with fat layer exposed: Secondary | ICD-10-CM | POA: Diagnosis not present

## 2023-01-20 DIAGNOSIS — M79674 Pain in right toe(s): Secondary | ICD-10-CM

## 2023-01-20 DIAGNOSIS — E0843 Diabetes mellitus due to underlying condition with diabetic autonomic (poly)neuropathy: Secondary | ICD-10-CM | POA: Diagnosis not present

## 2023-01-20 DIAGNOSIS — M79675 Pain in left toe(s): Secondary | ICD-10-CM

## 2023-01-20 DIAGNOSIS — B351 Tinea unguium: Secondary | ICD-10-CM | POA: Diagnosis not present

## 2023-01-20 DIAGNOSIS — I700 Atherosclerosis of aorta: Secondary | ICD-10-CM | POA: Diagnosis not present

## 2023-01-20 DIAGNOSIS — R0902 Hypoxemia: Secondary | ICD-10-CM

## 2023-01-20 DIAGNOSIS — J439 Emphysema, unspecified: Secondary | ICD-10-CM | POA: Diagnosis not present

## 2023-01-20 DIAGNOSIS — Z8679 Personal history of other diseases of the circulatory system: Secondary | ICD-10-CM

## 2023-01-20 DIAGNOSIS — Z5181 Encounter for therapeutic drug level monitoring: Secondary | ICD-10-CM

## 2023-01-20 DIAGNOSIS — J432 Centrilobular emphysema: Secondary | ICD-10-CM

## 2023-01-20 NOTE — Progress Notes (Signed)
Chief Complaint  Patient presents with   Debridement    Trim toenails/callus   "I need the nails cut and the callus checked. I did have some vascular stuff done to help my blood flow. We had talked about surgery, but can't do it just yet"    Subjective:  77 y.o. male with PMHx of diabetes mellitus presenting today for recurrence of the ulcer to the plantar aspect of the first MTP of the right foot.  Patient states that he has been dealing with a callus/ulcer to the area since 2018.  Patient states that today he is doing very well.  Since last visit he has had cardiac stents and lower extremity angiogram procedures performed.  He says that being off of his feet is helped his wounds significantly.  Patient also requesting nail debridement today.  He says that his nails are long and tender especially in his close toed shoes.  He is unable to trim his own toenails.   Past Medical History:  Diagnosis Date   AAA (abdominal aortic aneurysm) (HCC) 2011   Per vascular surgery   Atrial fibrillation (HCC)    CAD (coronary artery disease)    Presumed CAD with nuclear scan October 09, 2011,  large anteroseptal MI and inferior MI. Catheterization scheduled October 15, 2011   Cardiomyopathy [facility])    Nuclear, October 09, 2011, EF 30%, multiple focal wall motion abnormalities   Chronic kidney disease    CKD3   COVID-19    2021   Diabetes mellitus    type II   GERD (gastroesophageal reflux disease)    Silent   HLD (hyperlipidemia)    Hypertension    white coat HTN-- often elevated in office and controlled on outside checks.   ICD (implantable cardioverter-defibrillator) in place    CRT-D placed March, 2014 complete heart block and k dysfunction   IPF (idiopathic pulmonary fibrosis) (HCC)    LBBB (left bundle branch block)    LBBB on EKG October 11, 2011,  no prior EKG has been done   Low testosterone    Hx of   On home oxygen therapy    2L as needed   Pacemaker    PAD (peripheral artery disease) (HCC)     Pancreatitis    Umbilical hernia     Past Surgical History:  Procedure Laterality Date   ABDOMINAL AORTAGRAM N/A 10/28/2011   Procedure: ABDOMINAL Ronny Flurry;  Surgeon: [name], MD;  Location: [facility] CATH LAB;  Service: Cardiovascular;  Laterality: N/A;   ABDOMINAL AORTIC ANEURYSM REPAIR     EVAR    ABDOMINAL AORTOGRAM N/A 05/27/2022   Procedure: ABDOMINAL AORTOGRAM;  Surgeon: Maeola Harman, MD;  Location: [facility] INVASIVE CV LAB;  Service: Cardiovascular;  Laterality: N/A;   BI-VENTRICULAR IMPLANTABLE CARDIOVERTER DEFIBRILLATOR N/A 07/16/2012   Procedure: BI-VENTRICULAR IMPLANTABLE CARDIOVERTER DEFIBRILLATOR  (CRT-D);  Surgeon: Duke Salvia, MD;  Location: [facility] CATH LAB;  Service: Cardiovascular;  Laterality: N/A;   BIV ICD GENERATOR CHANGEOUT N/A 10/25/2020   Procedure: BIV ICD GENERATOR CHANGEOUT;  Surgeon: Duke Salvia, MD;  Location: [facility] INVASIVE CV LAB;  Service: Cardiovascular;  Laterality: N/A;   CARDIAC CATHETERIZATION     CATARACT EXTRACTION Left 2023   COLONOSCOPY  03/23/2018; 2020   CORONARY CTO INTERVENTION N/A 10/10/2022   Procedure: CORONARY CTO INTERVENTION;  Surgeon: Corky Crafts, MD;  Location: [facility] INVASIVE CV LAB;  Service: Cardiovascular;  Laterality: N/A;   CORONARY LITHOTRIPSY N/A 09/18/2022   Procedure: CORONARY LITHOTRIPSY;  Surgeon: Corky Crafts, MD;  Location: [facility] INVASIVE CV LAB;  Service: Cardiovascular;  Laterality: N/A;   CORONARY STENT INTERVENTION N/A 09/18/2022   Procedure: CORONARY STENT INTERVENTION;  Surgeon: Corky Crafts, MD;  Location: [facility] INVASIVE CV LAB;  Service: Cardiovascular;  Laterality: N/A;   CORONARY ULTRASOUND/IVUS N/A 09/18/2022   Procedure: Coronary Ultrasound/IVUS;  Surgeon: Corky Crafts, MD;  Location: [facility] INVASIVE CV LAB;  Service: Cardiovascular;  Laterality: N/A;   ENDARTERECTOMY FEMORAL Right 11/19/2022   Procedure: RIGHT COMMON FEMORAL ENDARTERECTOMY WITH VEIN PATCH ANGIOPLASTY;  Surgeon:  Maeola Harman, MD;  Location: [facility] OR;  Service: Vascular;  Laterality: Right;   ESOPHAGOGASTRODUODENOSCOPY N/A 02/12/2019   Procedure: ESOPHAGOGASTRODUODENOSCOPY (EGD);  Surgeon: Lynann Bologna, MD;  Location: [facility] ENDOSCOPY;  Service: Endoscopy;  Laterality: N/A;   ESOPHAGOGASTRODUODENOSCOPY (EGD) WITH PROPOFOL N/A 02/05/2018   Procedure: ESOPHAGOGASTRODUODENOSCOPY (EGD) WITH PROPOFOL;  Surgeon: Napoleon Form, MD;  Location: [facility] ENDOSCOPY;  Service: Endoscopy;  Laterality: N/A;   ESOPHAGOGASTRODUODENOSCOPY (EGD) WITH PROPOFOL N/A 09/27/2018   Procedure: ESOPHAGOGASTRODUODENOSCOPY (EGD) WITH PROPOFOL;  Surgeon: Jeani Hawking, MD;  Location: [facility] ENDOSCOPY;  Service: Endoscopy;  Laterality: N/A;   FOREIGN BODY REMOVAL  09/27/2018   Procedure: FOREIGN BODY REMOVAL;  Surgeon: Jeani Hawking, MD;  Location: [facility] ENDOSCOPY;  Service: Endoscopy;;   FOREIGN BODY REMOVAL  02/12/2019   Procedure: FOREIGN BODY REMOVAL;  Surgeon: Lynann Bologna, MD;  Location: [facility] ENDOSCOPY;  Service: Endoscopy;;   HERNIA REPAIR  05/14/2013   INSERTION OF MESH N/A 05/14/2013   Procedure: INSERTION OF MESH;  Surgeon: Ardeth Sportsman, MD;  Location: [facility] OR;  Service: General;  Laterality: N/A;   LEFT HEART CATH N/A 10/10/2022   Procedure: Left Heart Cath;  Surgeon: Corky Crafts, MD;  Location: [facility] INVASIVE CV LAB;  Service: Cardiovascular;  Laterality: N/A;   LEFT HEART CATH AND CORONARY ANGIOGRAPHY N/A 07/17/2021   Procedure: LEFT HEART CATH AND CORONARY ANGIOGRAPHY;  Surgeon: Lennette Bihari, MD;  Location: [facility] INVASIVE CV LAB;  Service: Cardiovascular;  Laterality: N/A;   LEFT HEART CATH AND CORONARY ANGIOGRAPHY N/A 09/18/2022   Procedure: LEFT HEART CATH AND CORONARY ANGIOGRAPHY;  Surgeon: Corky Crafts, MD;  Location: [facility] INVASIVE CV LAB;  Service: Cardiovascular;  Laterality: N/A;   LOWER EXTREMITY ANGIOGRAM Bilateral 10/28/2011   Procedure: LOWER EXTREMITY ANGIOGRAM;  Surgeon: [name], MD;   Location: [facility] CATH LAB;  Service: Cardiovascular;  Laterality: Bilateral;   LOWER EXTREMITY ANGIOGRAM Right 11/19/2022   Procedure: RIGHT LOWER EXTREMITY ANGIOGRAM;  Surgeon: Maeola Harman, MD;  Location: [facility] OR;  Service: Vascular;  Laterality: Right;   LOWER EXTREMITY ANGIOGRAPHY  05/27/2022   Procedure: Lower Extremity Angiography;  Surgeon: Maeola Harman, MD;  Location: [facility] INVASIVE CV LAB;  Service: Cardiovascular;;   PACEMAKER INSERTION  07/16/2012   pacemaker/defibrilator   PATCH ANGIOPLASTY Right 11/19/2022   Procedure: VEIN PATCH ANGIOPLASTY;  Surgeon: Maeola Harman, MD;  Location: [facility] OR;  Service: Vascular;  Laterality: Right;   POLYPECTOMY     POSTERIOR CERVICAL FUSION/FORAMINOTOMY N/A 06/09/2014   Procedure: Laminectomy - Cervical two-Cervcial four posterior cervical instrumented fusion Cervical two-cervical four;  Surgeon: Tia Alert, MD;  Location: [facility] NEURO ORS;  Service: Neurosurgery;  Laterality: N/A;  posterior    SAVORY DILATION N/A 02/05/2018   Procedure: SAVORY DILATION;  Surgeon: Napoleon Form, MD;  Location: [facility] ENDOSCOPY;  Service: Endoscopy;  Laterality: N/A;   TONSILLECTOMY     as a child  UMBILICAL HERNIA REPAIR N/A 05/14/2013   Procedure: LAPAROSCOPIC exploration and repair of hernia in abdominal ;  Surgeon: Ardeth Sportsman, MD;  Location: [facility] OR;  Service: General;  Laterality: N/A;   UPPER GASTROINTESTINAL ENDOSCOPY  2020    Allergies  Allergen Reactions   Aldactone [Spironolactone] Other (See Comments)    Hyperkalemia   Brilinta [Ticagrelor] Shortness Of Breath    Numbness in hands and feet Blurry vision   Clopidogrel Rash    Redness and Itchiness   Codeine Rash and Hives   Amoxicillin Hives   Atorvastatin     Myalgias with lipitor.  Does tolerate simvastatin.     Benadryl [Diphenhydramine] Hives   Januvia [Sitagliptin] Other (See Comments)    Diarrhea and heart racing   Jardiance [Empagliflozin] Other (See  Comments)    Polyuria; excessive weight loss   Lisinopril     Possible cause of pancreatitis   Rosuvastatin Other (See Comments)    myalgia    RT foot 07/05/2022   Objective/Physical Exam General: The patient is alert and oriented x3 in no acute distress.  Dermatology:  Today the ulcer to the plantar aspect of the right great toe is completely resolved and healed.  There is no open wound.  No draining sinus noted.  There is a significant amount of hyperkeratotic callus tissue which is actually beginning to peel off of the foot.  Vascular: Lower extremity angiography 05/27/2022 performed by Dr. Lemar Livings, vein and vascular.  Please see report.  Being closely monitored with vascular  Neurological: Light touch and protective threshold diminished bilaterally.   Musculoskeletal Exam: No pedal deformity.  No prior amputations.  Muscle strength 5/5 all compartments  Radiographic exam RT foot 08/12/2022: Mostly unchanged from prior x-rays.  Stable.  No radiolucency or gas within the tissues and does not present on prior x-rays.  No radiographic evidence of obvious osseous erosions or signs of osteomyelitis  Assessment: 1.  Ulcer plantar aspect of the first MTP right secondary to diabetes mellitus 2. diabetes mellitus w/ peripheral neuropathy 3.  Pain due to onychomycosis of toenails both   Plan of Care:  -Patient evaluated.  Light debridement of the loosely adhered callus tissue was performed today.  Healthy underlying skin noted. -Mechanical debridement of nails 1-5 bilateral was performed using a nail nipper without incident or bleeding -Continue wearing good supportive shoes and sneakers.  Advised against going barefoot -Continue close monitoring with [facility] [place] -Return to clinic 3 months  Felecia Shelling, DPM [facility]  Dr. Felecia Shelling, DPM    [address] [place], [place] [address]                Office [phone]  Fax  [phone]

## 2023-01-21 NOTE — Progress Notes (Unsigned)
HISTORY AND PHYSICAL     CC:  follow up. Requesting Provider:  [name], MD  HPI: This is a 77 y.o. male who is here today for follow up for PAD.  Pt has hx of extensive right CFA endarterectomy including EIA, profunda and SFA endarterectomy with vein patch angioplasty with right GSV for CLI with tissue loss on 11/19/2022 by Dr. Randie Heinz.  He has hx of EVAR 12/03/2011 by Dr. Edilia Bo.   Pt was last seen 12/12/2022 and at that time, he felt his tissue loss was significantly improved and his groin incision was healing. He was not having any rest pain.  He was still having some cracking in the skin especially around the right ankle but was drastically improved.  He was scheduled for follow up and ABI in one month.  If he is unable to heal his wounds, Dr. Randie Heinz would consider antegrade and retrograde endovascular approach vs bypass to BK popliteal with PTFE.    The pt returns today for follow up.  He states that he is doing much better.  He denies any claudication or rest pain.  He states the wound on the bottom of his foot is better as is his skin on the right lower leg.  He states he has hx of Idiopathic Pulmonary Fibrosis and recent cardiac stents as well as CKD.  He states the shortness of breath is similar to when he had the stents placed.  He denies any chest pain.    The pt is on a statin for cholesterol management.    The pt is on an aspirin.    Other AC:  Effient The pt is on CCB, BB, diuretic for hypertension.  The pt is  on medication for diabetes. Tobacco hx:  former   PHYSICAL EXAMINATION:  Today's Vitals   01/22/23 1118  BP: (!) 146/80  Resp: 18  Temp: 98.4 F (36.9 C)  TempSrc: Temporal  SpO2: 93%  Weight: 176 lb 12.8 oz (80.2 kg)  Height: 6' (1.829 m)   Body mass index is 23.98 kg/m.   General:  WDWN in NAD; vital signs documented above Gait: Not observed HENT: WNL, normocephalic Pulmonary: normal non-labored breathing , without wheezing Skin: without  rashes Vascular Exam/Pulses: + doppler flow right DP/PT/peroneal Extremities:         Non-Invasive Vascular Imaging:   ABI's/TBI's on 01/13/2023: Right:  0.58/0.13 - Great toe pressure: 20 Left:  0.90/0.68 - Great toe pressure: 106  Previous ABI's/TBI's on 05/23/2022: Right:  0.63/0.28 - Great toe pressure: 42 Left:  0.91/0.60 - Great toe pressure:  91  Previous EVAR duplex on 05/23/2022: Abdominal Aorta: The largest aortic diameter remains essentially unchanged compared to prior duplex exam. Previous diameter measurement was 3.1 cm obtained on 04/12/2021. Prior CT 08/21/2021 measured sac at 3.3 cm.     ASSESSMENT/PLAN:: 77 y.o. male here for follow up for PAD with hx of extensive right CFA endarterectomy including EIA, profunda and SFA endarterectomy with vein patch angioplasty with right GSV for CLI with tissue loss on 11/19/2022 by Dr. Randie Heinz.  He has hx of EVAR 12/03/2011 by Dr. Edilia Bo.   CLI with tissue loss -ABI on 01/13/2023 not improved, however, pt RLE CLI tissue loss has significantly improved.  He continues to have some mild swelling, but this has also significantly improved and bilateral calves are soft and non tender.  -continue asa/statin.  He is also on Effient for his cardiac stents.  -he has had some SOB without chest pain  and has appt with Dr. Anne Fu tomorrow as well as his pulmonologist.  He does have some swelling in the right leg that is expected and significantly improved.  He was measured for 15-81mmHg knee high compression sock today and given one.  He is instructed to put on in the am before getting out of bed and taking off when going to bed. He will elevate his leg and continue increasing his mobilization -pt will f/u in 3 months with ABI on Dr. Randie Heinz clinic day.  He wil  Hx AAA with EVAR 2013 -plan for EVAR duplex in January 2025-we will make sure this is arranged at his next visit.   Doreatha Massed, [facility] [phone]  Clinic MD:    Randie Heinz

## 2023-01-22 ENCOUNTER — Ambulatory Visit (INDEPENDENT_AMBULATORY_CARE_PROVIDER_SITE_OTHER): Payer: Medicare Other

## 2023-01-22 ENCOUNTER — Ambulatory Visit (INDEPENDENT_AMBULATORY_CARE_PROVIDER_SITE_OTHER): Payer: Medicare Other | Admitting: Physician Assistant

## 2023-01-22 ENCOUNTER — Encounter: Payer: Self-pay | Admitting: Physician Assistant

## 2023-01-22 VITALS — BP 146/80 | Temp 98.4°F | Resp 18 | Ht 72.0 in | Wt 176.8 lb

## 2023-01-22 DIAGNOSIS — I447 Left bundle-branch block, unspecified: Secondary | ICD-10-CM

## 2023-01-22 DIAGNOSIS — I739 Peripheral vascular disease, unspecified: Secondary | ICD-10-CM

## 2023-01-22 LAB — CUP PACEART REMOTE DEVICE CHECK
Battery Remaining Longevity: 67 mo
Battery Remaining Percentage: 72 %
Battery Voltage: 2.96 V
Brady Statistic AP VP Percent: 7.1 %
Brady Statistic AP VS Percent: 1 %
Brady Statistic AS VP Percent: 93 %
Brady Statistic AS VS Percent: 1 %
Brady Statistic RA Percent Paced: 6.7 %
Date Time Interrogation Session: 20240918040008
HighPow Impedance: 74 Ohm
Implantable Lead Connection Status: 753985
Implantable Lead Connection Status: 753985
Implantable Lead Connection Status: 753985
Implantable Lead Implant Date: 20140313
Implantable Lead Implant Date: 20140313
Implantable Lead Implant Date: 20140313
Implantable Lead Location: 753858
Implantable Lead Location: 753859
Implantable Lead Location: 753860
Implantable Pulse Generator Implant Date: 20220622
Lead Channel Impedance Value: 380 Ohm
Lead Channel Impedance Value: 410 Ohm
Lead Channel Impedance Value: 750 Ohm
Lead Channel Pacing Threshold Amplitude: 0.75 V
Lead Channel Pacing Threshold Amplitude: 0.75 V
Lead Channel Pacing Threshold Amplitude: 1.875 V
Lead Channel Pacing Threshold Pulse Width: 0.5 ms
Lead Channel Pacing Threshold Pulse Width: 0.5 ms
Lead Channel Pacing Threshold Pulse Width: 0.6 ms
Lead Channel Sensing Intrinsic Amplitude: 11.8 mV
Lead Channel Sensing Intrinsic Amplitude: 3 mV
Lead Channel Setting Pacing Amplitude: 1.75 V
Lead Channel Setting Pacing Amplitude: 2 V
Lead Channel Setting Pacing Amplitude: 2.375
Lead Channel Setting Pacing Pulse Width: 0.5 ms
Lead Channel Setting Pacing Pulse Width: 0.6 ms
Lead Channel Setting Sensing Sensitivity: 0.5 mV
Pulse Gen Serial Number: 810030647

## 2023-01-22 NOTE — Progress Notes (Unsigned)
PCP - Cardiology (Cardiology) [name], MD (Vascular Surgery) Willis Modena, MD (Gastroenterology) Karie Soda, MD (General Surgery) Tia Alert, MD (Neurosurgery) Davina Poke (Optometry) Jake Bathe, MD (Cardiology) Kathyrn Sheriff, [facility] as Pharmacist (Pharmacist)  This Provider for this visit: Treatment Team:  Attending Provider: Kalman Shan, MD     04/04/2022 -   Chief Complaint  Patient presents with   Follow-up    PFT performed today.  Pt states he has been doing okay since last visit and denies any complaints.     HPI Luke [name] 77 y.o. -returns for follow-up.  At this visit he had pulmonary function test that shows stability in FVC but his DLCO is decreased.  Symptom wise he feels stable.  He had a high-resolution CT chest that showed stability in the ILD for over a time.  He believes the DLCO decline is because of his heart failure because he feels good.  He is able to mow  his lawn and raking leaves.  Based on the decline in DLCO he is open to getting his BNP and also CBC checked.  He also wants his kidney function checked.  He is compliant with his medications.  Of note his lung nodules have resolved on the CT chest. LFT normal Nov 2023  He continues on Esbriet 2 pills 3 times daily. 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 - 10 days ago he also fell from the ladder and sprained his ankle     OV 07/29/2022  Subjective:  Patient ID: Luke [name], male , DOB: 1945/10/13 , age 31 y.o. , MRN: [id number] , ADDRESS: [address] [place] [address] PCP [name], MD Patient Care Team: [name], MD as PCP - General Jake Bathe, MD as PCP - Cardiology (Cardiology) [name], MD (Vascular Surgery) Willis Modena, MD (Gastroenterology) Karie Soda, MD (General Surgery) Tia Alert, MD (Neurosurgery) Davina Poke (Optometry) Jake Bathe, MD (Cardiology) Kathyrn Sheriff, [facility] as Pharmacist (Pharmacist)  This Provider for this visit: Treatment Team:  Attending Provider: Kalman Shan, MD   07/29/2022 -   Chief Complaint  Patient presents with   Follow-up    Review PFT today.  Doing well.  Using exercise bike at home and gym 3 days a week.     HPI Luke [name] 77 y.o. -returns for follow-up.  At his last visit he was feeling well and his CT scan was stable his FVC was stable but his DLCO had dropped.  Therefore we decided to bring him back a little bit early to recheck his pulmonary function test.  He now tells me at the last pulmonary function test he did not have his dentures and possibly there was an air leak.  Today his FVC and DLCO are all stable.  He feels good.  He is tolerating his pirfenidone well.  Weight is stable.  He is dealing with other issues other than IPF.  He says his right lower extremity one of the main arteries is got claudication again.  He has a right  foot callus that is now an ulcer.  He has to have surgery for both.  He is dealing with these issues.  Most recent liver function test was in February 2024 and pirfenidone and normal.  His current symptom scores are listed below.  His walking desaturation test is stable.        OV 01/09/2023  vegetarian/vegan diet.  He admits readily that he is a carnivore.  In the last meat-based diet.   OV 10/03/2021  Subjective:  Patient ID: Luke [name], male ,  DOB: 1945/07/03 , age 84 y.o. , MRN: [id number] , ADDRESS: [address] [place] [address] PCP [name], MD Patient Care Team: [name], MD as PCP - General Jake Bathe, MD as PCP - Cardiology (Cardiology) [name], MD (Vascular Surgery) Willis Modena, MD (Gastroenterology) Karie Soda, MD (General Surgery) Tia Alert, MD (Neurosurgery) Davina Poke (Optometry) Jake Bathe, MD (Cardiology) Phil Dopp, [facility] as Pharmacist (Pharmacist)  This Provider for this visit: Treatment Team:  Attending Provider: Kalman Shan, MD  02/14/2021 -  FU IPF with some emphysema. Has sCHF = - esbriet shipment at his home 03/23/21  - on esbiret 2 pills tid due to CKD hx (ofev kept as 2nd choice due to his heart issues)  -  esbriet on hold since mid march 2023 due to dizziness made by worse by nitrate and improved after stopping esbriet  - restart late may 2023  10/03/2021 -   Chief Complaint  Patient presents with   Follow-up    Pt states he has been doing okay since last visit and denies any real complaints.     HPI Luke [name] 77 y.o. -returns for follow-up.  He says he is doing well.  The symptoms are minimal.  He continues to exercise.  He says he has difficulty tolerating medication adds.  He has not been stable on the isosorbide which gave him dizziness in the setting of pirfenidone.  He stopped the pirfenidone although he attributed the dizziness to the isosorbide.  After stopping pirfenidone his dizziness went away.  Now for the last 20 days he is also been on valsartan.  He believes it was a new start.  For the last 1 week he is now back on pirfenidone at 1 pill 3 times daily.  There are no side effects right now.  He is trying to go to 2 pills 3 times daily [the maximum dose and account of his CKD].  He is worried that he can get side effects such as fogginess and headaches.  But he is willing to take it and monitor.  He is  interested in clinical trials but at this point in time his is.  Is not on a stable dose.  His symptom score is stable His pulmonary function test shows stability/improvement in FVC.  Last CT scan of the chest May 2022     OV 01/10/2022  Subjective:  Patient ID: Luke [name], male , DOB: [birth year] , age 62 y.o. , MRN: [id number] , ADDRESS: [address] [place] [address] PCP [name], MD Patient Care Team: [name], MD as PCP - General Jake Bathe, MD as PCP - Cardiology (Cardiology) [name], MD (Vascular Surgery) Willis Modena, MD (Gastroenterology) Karie Soda, MD (General Surgery) Tia Alert, MD (Neurosurgery) Davina Poke (Optometry) Jake Bathe, MD (Cardiology) Kathyrn Sheriff, [facility] as Pharmacist (Pharmacist)  This Provider for this visit: Treatment Team:  Attending Provider: Kalman Shan, MD    01/10/2022 -   Chief Complaint  Patient presents with   Follow-up    PFT performed today.  Pt states he has been dong okay since last visit and denies any complaints.   HPI Luke [name] 77 y.o. -returns for follow-up.  He continues to  PCP - Cardiology (Cardiology) [name], MD (Vascular Surgery) Willis Modena, MD (Gastroenterology) Karie Soda, MD (General Surgery) Tia Alert, MD (Neurosurgery) Davina Poke (Optometry) Jake Bathe, MD (Cardiology) Kathyrn Sheriff, [facility] as Pharmacist (Pharmacist)  This Provider for this visit: Treatment Team:  Attending Provider: Kalman Shan, MD     04/04/2022 -   Chief Complaint  Patient presents with   Follow-up    PFT performed today.  Pt states he has been doing okay since last visit and denies any complaints.     HPI Luke [name] 77 y.o. -returns for follow-up.  At this visit he had pulmonary function test that shows stability in FVC but his DLCO is decreased.  Symptom wise he feels stable.  He had a high-resolution CT chest that showed stability in the ILD for over a time.  He believes the DLCO decline is because of his heart failure because he feels good.  He is able to mow  his lawn and raking leaves.  Based on the decline in DLCO he is open to getting his BNP and also CBC checked.  He also wants his kidney function checked.  He is compliant with his medications.  Of note his lung nodules have resolved on the CT chest. LFT normal Nov 2023  He continues on Esbriet 2 pills 3 times daily. 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 - 10 days ago he also fell from the ladder and sprained his ankle     OV 07/29/2022  Subjective:  Patient ID: Luke [name], male , DOB: 1945/10/13 , age 31 y.o. , MRN: [id number] , ADDRESS: [address] [place] [address] PCP [name], MD Patient Care Team: [name], MD as PCP - General Jake Bathe, MD as PCP - Cardiology (Cardiology) [name], MD (Vascular Surgery) Willis Modena, MD (Gastroenterology) Karie Soda, MD (General Surgery) Tia Alert, MD (Neurosurgery) Davina Poke (Optometry) Jake Bathe, MD (Cardiology) Kathyrn Sheriff, [facility] as Pharmacist (Pharmacist)  This Provider for this visit: Treatment Team:  Attending Provider: Kalman Shan, MD   07/29/2022 -   Chief Complaint  Patient presents with   Follow-up    Review PFT today.  Doing well.  Using exercise bike at home and gym 3 days a week.     HPI Luke [name] 77 y.o. -returns for follow-up.  At his last visit he was feeling well and his CT scan was stable his FVC was stable but his DLCO had dropped.  Therefore we decided to bring him back a little bit early to recheck his pulmonary function test.  He now tells me at the last pulmonary function test he did not have his dentures and possibly there was an air leak.  Today his FVC and DLCO are all stable.  He feels good.  He is tolerating his pirfenidone well.  Weight is stable.  He is dealing with other issues other than IPF.  He says his right lower extremity one of the main arteries is got claudication again.  He has a right  foot callus that is now an ulcer.  He has to have surgery for both.  He is dealing with these issues.  Most recent liver function test was in February 2024 and pirfenidone and normal.  His current symptom scores are listed below.  His walking desaturation test is stable.        OV 01/09/2023  personally visualzied -definite progression is combined emphysema with UIP features.  IMPRESSION: 1. Spectrum of findings compatible with basilar predominant fibrotic interstitial lung disease without frank honeycombing, with mild progression at the lung bases since 03/25/2018 CT abdomen study, with more clear progression since 10/07/2011 CT abdomen study. Findings are categorized as probable UIP per consensus guidelines: Diagnosis of Idiopathic Pulmonary Fibrosis: An Official ATS/ERS/JRS/ALAT Clinical Practice Guideline. Am Rosezetta Schlatter Crit Care Med Vol 198, Iss 5, [id number], Jan 04 2017. 2. Scattered small solid pulmonary nodules, largest 5 mm.  Follow-up noncontrast chest CT recommended in 12 months. This recommendation follows the consensus statement: Guidelines for Management of Incidental Pulmonary Nodules Detected on CT Images: From the Fleischner Society 2017; Radiology 2017; 284:228-243. 3. Three-vessel coronary atherosclerosis. 4. Chronic atrophy of pancreatic body and tail, progressive since 2019 CT abdomen study, without discrete mass on this noncontrast CT, indeterminate but presumably due to progressive chronic pancreatitis. 5. Aortic Atherosclerosis (ICD10-I70.0) and Emphysema (ICD10-J43.9).     Electronically Signed   By: Delbert Phenix M.D.   On: 10/03/2020 16:53  SErology  Results for Luke [name], Luke [name]" (MRN [id number]) as of 01/02/2021 14:44  Ref. Range 11/13/2020 16:09  Anti Nuclear Antibody (ANA) Latest Ref Range: NEGATIVE  NEGATIVE  Angiotensin-Converting Enzyme Latest Ref Range: 9 - 67 U/L 27  Cyclic Citrullin Peptide Ab Latest Units: UNITS <16  ds DNA Ab Latest Units: IU/mL <1  Myeloperoxidase Abs Latest Units: AI <1.0  Serine Protease 3 Latest Units: AI <1.0  RA Latex Turbid. Latest Ref Range: <14 IU/mL <14  SSA (Ro) (ENA) Antibody, IgG Latest Ref Range: <1.0 NEG AI <1.0 NEG  SSB (La) (ENA) Antibody, IgG Latest Ref Range: <1.0 NEG AI <1.0 NEG  Scleroderma (Scl-70) (ENA) Antibody, IgG Latest Ref Range: <1.0 NEG AI <1.0 NEG    eCHO arch 2019  Study Conclusions   - Left ventricle: The cavity size was normal. Wall thickness was    normal. Systolic function was normal. The estimated ejection    fraction was in the range of 55% to 60%. Wall motion was normal;    there were no regional wall motion abnormalities. Doppler    parameters are consistent with abnormal left ventricular    relaxation (grade 1 diastolic dysfunction).  - Mitral valve: There was mild regurgitation.  - Pulmonary arteries: Systolic pressure was moderately increased.    PA peak pressure: 40 mm Hg (S).     OV 02/14/2021 -  telephine visit  Subjective:  Patient ID: Luke [name], male , DOB: [birth year] , age 48 y.o. , MRN: [id number] , ADDRESS: [address] [place] [address] PCP [name], MD Patient Care Team: [name], MD as PCP - General [name], MD (Vascular Surgery) Willis Modena, MD (Gastroenterology) Karie Soda, MD (General Surgery) Tia Alert, MD (Neurosurgery) Davina Poke [facility]) Jake Bathe, MD (Cardiology) Phil Dopp, [facility] as Pharmacist (Pharmacist)   Type of visit: Telephone/Video Circumstance: COVID-19 national emergency Identification of patient Luke [name] with 1946/04/13 and MRN [id number] - 2 person identifier Risks: Risks, benefits, limitations of telephone visit explained. Patient understood and verbalized agreement to proceed Anyone else on call: just patient Patient location: [phone] -  This provider location: [address], [place]  This Provider for this visit: Treatment Team:  Attending Provider: Kalman Shan, MD    02/14/2021 -  FU IPF with some emphysema. Has sCHF  HPI Luke [name] 77 y.o. -telephone visit to  see how his esbriet started is going butt turns out he says he never got referred to pharmacist. Pharmacist confirmed that she is yet to meet iwht him. No esbriet started. Says even spirivan not started. Also, supposed to be telephone visit but got confusing call and he showed up at front desk only to be sent home. Apologized for gap in customer service. Overall well. He said after last vsit he read more about esbriet and got apprehensive esp in light of CKD but lab review shows GFR > 60 this year. 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  Instructions   - Slowly increase the dose per protocol  -Always take it with food  -Any nausea you can try ginger capsules  -Give at least between 5 and 6  hours between dosing  -Good idea to participate in [employer] sponsored medication support program - this is optional  -Definitely apply sunscreen when you go out with this medication  - You will need monthly liver tests for 6 months and thereafter every 3 months  - explained in decreaesd renal functio that side ffects could be higher but his GFR is good enough for full dose  - explained benefit of slowing fibrosis progression and that NNT of 1 to 6 is pretty good   We resolved we will jsu start and titrate up to max dose of 2 pills tid which is 2/3rd normal dose   CT Chest data  No results found.  Results for Luke [name], Luke [name]" (MRN [id number]) as of 02/14/2021 10:42  Ref. Range 10/23/2020 10:57  Creatinine Latest Ref Range: 0.76 - 1.27 mg/dL 4.09  Results for Luke [name], Luke [name]" (MRN [id number]) as of 02/14/2021 10:42  Ref. Range 01/02/2021 15:40  AST Latest Ref Range: 0 - 37 U/L 17  ALT Latest Ref Range: 0 - 53 U/L 16      OV 05/10/2021  Subjective:  Patient ID: Luke [name], male , DOB: [birth year] , age 20 y.o. , MRN: [id number] , ADDRESS: [address] [place] [address] PCP [name], MD Patient Care Team: [name], MD as PCP - General [name], MD (Vascular Surgery) Willis Modena, MD (Gastroenterology) Karie Soda, MD (General Surgery) Tia Alert, MD (Neurosurgery) Davina Poke (Optometry) Jake Bathe, MD (Cardiology) Phil Dopp, [facility] as Pharmacist (Pharmacist)  This Provider for this visit: Treatment Team:  Attending Provider: Kalman Shan, MD    05/10/2021 -   Chief Complaint  Patient presents with   Follow-up    PFT performed today.  Pt states he has been doing okay since last visit and denies any complaints.   02/14/2021 -  FU IPF with some emphysema. Has sCHF = - esbriet shipment at his home 03/23/21  - on esbiret 2 pills tid due to CKD hx (ofev 2nd choice due to his heart  issues)     HPI Luke [name] 77 y.o. -presents for follow-up.  Since his last visit he started pirfenidone.  After starting pirfenidone he developed COVID-19.  In the midst of all this he had to hold his COVID.  He also had diarrhea.  It was not fully clear in November 2022 whether the diarrhea was from COVID or from pirfenidone.  Nevertheless sometime around Thanksgiving he restarted pirfenidone.  He slowly escalated himself within a week to 2 pills 3 times daily.  He is by choice  see how his esbriet started is going butt turns out he says he never got referred to pharmacist. Pharmacist confirmed that she is yet to meet iwht him. No esbriet started. Says even spirivan not started. Also, supposed to be telephone visit but got confusing call and he showed up at front desk only to be sent home. Apologized for gap in customer service. Overall well. He said after last vsit he read more about esbriet and got apprehensive esp in light of CKD but lab review shows GFR > 60 this year. 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  Instructions   - Slowly increase the dose per protocol  -Always take it with food  -Any nausea you can try ginger capsules  -Give at least between 5 and 6  hours between dosing  -Good idea to participate in [employer] sponsored medication support program - this is optional  -Definitely apply sunscreen when you go out with this medication  - You will need monthly liver tests for 6 months and thereafter every 3 months  - explained in decreaesd renal functio that side ffects could be higher but his GFR is good enough for full dose  - explained benefit of slowing fibrosis progression and that NNT of 1 to 6 is pretty good   We resolved we will jsu start and titrate up to max dose of 2 pills tid which is 2/3rd normal dose   CT Chest data  No results found.  Results for Luke [name], Luke [name]" (MRN [id number]) as of 02/14/2021 10:42  Ref. Range 10/23/2020 10:57  Creatinine Latest Ref Range: 0.76 - 1.27 mg/dL 4.09  Results for Luke [name], Luke [name]" (MRN [id number]) as of 02/14/2021 10:42  Ref. Range 01/02/2021 15:40  AST Latest Ref Range: 0 - 37 U/L 17  ALT Latest Ref Range: 0 - 53 U/L 16      OV 05/10/2021  Subjective:  Patient ID: Luke [name], male , DOB: [birth year] , age 20 y.o. , MRN: [id number] , ADDRESS: [address] [place] [address] PCP [name], MD Patient Care Team: [name], MD as PCP - General [name], MD (Vascular Surgery) Willis Modena, MD (Gastroenterology) Karie Soda, MD (General Surgery) Tia Alert, MD (Neurosurgery) Davina Poke (Optometry) Jake Bathe, MD (Cardiology) Phil Dopp, [facility] as Pharmacist (Pharmacist)  This Provider for this visit: Treatment Team:  Attending Provider: Kalman Shan, MD    05/10/2021 -   Chief Complaint  Patient presents with   Follow-up    PFT performed today.  Pt states he has been doing okay since last visit and denies any complaints.   02/14/2021 -  FU IPF with some emphysema. Has sCHF = - esbriet shipment at his home 03/23/21  - on esbiret 2 pills tid due to CKD hx (ofev 2nd choice due to his heart  issues)     HPI Luke [name] 77 y.o. -presents for follow-up.  Since his last visit he started pirfenidone.  After starting pirfenidone he developed COVID-19.  In the midst of all this he had to hold his COVID.  He also had diarrhea.  It was not fully clear in November 2022 whether the diarrhea was from COVID or from pirfenidone.  Nevertheless sometime around Thanksgiving he restarted pirfenidone.  He slowly escalated himself within a week to 2 pills 3 times daily.  He is by choice  vegetarian/vegan diet.  He admits readily that he is a carnivore.  In the last meat-based diet.   OV 10/03/2021  Subjective:  Patient ID: Luke [name], male ,  DOB: 1945/07/03 , age 84 y.o. , MRN: [id number] , ADDRESS: [address] [place] [address] PCP [name], MD Patient Care Team: [name], MD as PCP - General Jake Bathe, MD as PCP - Cardiology (Cardiology) [name], MD (Vascular Surgery) Willis Modena, MD (Gastroenterology) Karie Soda, MD (General Surgery) Tia Alert, MD (Neurosurgery) Davina Poke (Optometry) Jake Bathe, MD (Cardiology) Phil Dopp, [facility] as Pharmacist (Pharmacist)  This Provider for this visit: Treatment Team:  Attending Provider: Kalman Shan, MD  02/14/2021 -  FU IPF with some emphysema. Has sCHF = - esbriet shipment at his home 03/23/21  - on esbiret 2 pills tid due to CKD hx (ofev kept as 2nd choice due to his heart issues)  -  esbriet on hold since mid march 2023 due to dizziness made by worse by nitrate and improved after stopping esbriet  - restart late may 2023  10/03/2021 -   Chief Complaint  Patient presents with   Follow-up    Pt states he has been doing okay since last visit and denies any real complaints.     HPI Luke [name] 77 y.o. -returns for follow-up.  He says he is doing well.  The symptoms are minimal.  He continues to exercise.  He says he has difficulty tolerating medication adds.  He has not been stable on the isosorbide which gave him dizziness in the setting of pirfenidone.  He stopped the pirfenidone although he attributed the dizziness to the isosorbide.  After stopping pirfenidone his dizziness went away.  Now for the last 20 days he is also been on valsartan.  He believes it was a new start.  For the last 1 week he is now back on pirfenidone at 1 pill 3 times daily.  There are no side effects right now.  He is trying to go to 2 pills 3 times daily [the maximum dose and account of his CKD].  He is worried that he can get side effects such as fogginess and headaches.  But he is willing to take it and monitor.  He is  interested in clinical trials but at this point in time his is.  Is not on a stable dose.  His symptom score is stable His pulmonary function test shows stability/improvement in FVC.  Last CT scan of the chest May 2022     OV 01/10/2022  Subjective:  Patient ID: Luke [name], male , DOB: [birth year] , age 62 y.o. , MRN: [id number] , ADDRESS: [address] [place] [address] PCP [name], MD Patient Care Team: [name], MD as PCP - General Jake Bathe, MD as PCP - Cardiology (Cardiology) [name], MD (Vascular Surgery) Willis Modena, MD (Gastroenterology) Karie Soda, MD (General Surgery) Tia Alert, MD (Neurosurgery) Davina Poke (Optometry) Jake Bathe, MD (Cardiology) Kathyrn Sheriff, [facility] as Pharmacist (Pharmacist)  This Provider for this visit: Treatment Team:  Attending Provider: Kalman Shan, MD    01/10/2022 -   Chief Complaint  Patient presents with   Follow-up    PFT performed today.  Pt states he has been dong okay since last visit and denies any complaints.   HPI Luke [name] 77 y.o. -returns for follow-up.  He continues to  personally visualzied -definite progression is combined emphysema with UIP features.  IMPRESSION: 1. Spectrum of findings compatible with basilar predominant fibrotic interstitial lung disease without frank honeycombing, with mild progression at the lung bases since 03/25/2018 CT abdomen study, with more clear progression since 10/07/2011 CT abdomen study. Findings are categorized as probable UIP per consensus guidelines: Diagnosis of Idiopathic Pulmonary Fibrosis: An Official ATS/ERS/JRS/ALAT Clinical Practice Guideline. Am Rosezetta Schlatter Crit Care Med Vol 198, Iss 5, [id number], Jan 04 2017. 2. Scattered small solid pulmonary nodules, largest 5 mm.  Follow-up noncontrast chest CT recommended in 12 months. This recommendation follows the consensus statement: Guidelines for Management of Incidental Pulmonary Nodules Detected on CT Images: From the Fleischner Society 2017; Radiology 2017; 284:228-243. 3. Three-vessel coronary atherosclerosis. 4. Chronic atrophy of pancreatic body and tail, progressive since 2019 CT abdomen study, without discrete mass on this noncontrast CT, indeterminate but presumably due to progressive chronic pancreatitis. 5. Aortic Atherosclerosis (ICD10-I70.0) and Emphysema (ICD10-J43.9).     Electronically Signed   By: Delbert Phenix M.D.   On: 10/03/2020 16:53  SErology  Results for Luke [name], Luke [name]" (MRN [id number]) as of 01/02/2021 14:44  Ref. Range 11/13/2020 16:09  Anti Nuclear Antibody (ANA) Latest Ref Range: NEGATIVE  NEGATIVE  Angiotensin-Converting Enzyme Latest Ref Range: 9 - 67 U/L 27  Cyclic Citrullin Peptide Ab Latest Units: UNITS <16  ds DNA Ab Latest Units: IU/mL <1  Myeloperoxidase Abs Latest Units: AI <1.0  Serine Protease 3 Latest Units: AI <1.0  RA Latex Turbid. Latest Ref Range: <14 IU/mL <14  SSA (Ro) (ENA) Antibody, IgG Latest Ref Range: <1.0 NEG AI <1.0 NEG  SSB (La) (ENA) Antibody, IgG Latest Ref Range: <1.0 NEG AI <1.0 NEG  Scleroderma (Scl-70) (ENA) Antibody, IgG Latest Ref Range: <1.0 NEG AI <1.0 NEG    eCHO arch 2019  Study Conclusions   - Left ventricle: The cavity size was normal. Wall thickness was    normal. Systolic function was normal. The estimated ejection    fraction was in the range of 55% to 60%. Wall motion was normal;    there were no regional wall motion abnormalities. Doppler    parameters are consistent with abnormal left ventricular    relaxation (grade 1 diastolic dysfunction).  - Mitral valve: There was mild regurgitation.  - Pulmonary arteries: Systolic pressure was moderately increased.    PA peak pressure: 40 mm Hg (S).     OV 02/14/2021 -  telephine visit  Subjective:  Patient ID: Luke [name], male , DOB: [birth year] , age 48 y.o. , MRN: [id number] , ADDRESS: [address] [place] [address] PCP [name], MD Patient Care Team: [name], MD as PCP - General [name], MD (Vascular Surgery) Willis Modena, MD (Gastroenterology) Karie Soda, MD (General Surgery) Tia Alert, MD (Neurosurgery) Davina Poke [facility]) Jake Bathe, MD (Cardiology) Phil Dopp, [facility] as Pharmacist (Pharmacist)   Type of visit: Telephone/Video Circumstance: COVID-19 national emergency Identification of patient Luke [name] with 1946/04/13 and MRN [id number] - 2 person identifier Risks: Risks, benefits, limitations of telephone visit explained. Patient understood and verbalized agreement to proceed Anyone else on call: just patient Patient location: [phone] -  This provider location: [address], [place]  This Provider for this visit: Treatment Team:  Attending Provider: Kalman Shan, MD    02/14/2021 -  FU IPF with some emphysema. Has sCHF  HPI Luke [name] 77 y.o. -telephone visit to  see how his esbriet started is going butt turns out he says he never got referred to pharmacist. Pharmacist confirmed that she is yet to meet iwht him. No esbriet started. Says even spirivan not started. Also, supposed to be telephone visit but got confusing call and he showed up at front desk only to be sent home. Apologized for gap in customer service. Overall well. He said after last vsit he read more about esbriet and got apprehensive esp in light of CKD but lab review shows GFR > 60 this year. 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  Instructions   - Slowly increase the dose per protocol  -Always take it with food  -Any nausea you can try ginger capsules  -Give at least between 5 and 6  hours between dosing  -Good idea to participate in [employer] sponsored medication support program - this is optional  -Definitely apply sunscreen when you go out with this medication  - You will need monthly liver tests for 6 months and thereafter every 3 months  - explained in decreaesd renal functio that side ffects could be higher but his GFR is good enough for full dose  - explained benefit of slowing fibrosis progression and that NNT of 1 to 6 is pretty good   We resolved we will jsu start and titrate up to max dose of 2 pills tid which is 2/3rd normal dose   CT Chest data  No results found.  Results for Luke [name], Luke [name]" (MRN [id number]) as of 02/14/2021 10:42  Ref. Range 10/23/2020 10:57  Creatinine Latest Ref Range: 0.76 - 1.27 mg/dL 4.09  Results for Luke [name], Luke [name]" (MRN [id number]) as of 02/14/2021 10:42  Ref. Range 01/02/2021 15:40  AST Latest Ref Range: 0 - 37 U/L 17  ALT Latest Ref Range: 0 - 53 U/L 16      OV 05/10/2021  Subjective:  Patient ID: Luke [name], male , DOB: [birth year] , age 20 y.o. , MRN: [id number] , ADDRESS: [address] [place] [address] PCP [name], MD Patient Care Team: [name], MD as PCP - General [name], MD (Vascular Surgery) Willis Modena, MD (Gastroenterology) Karie Soda, MD (General Surgery) Tia Alert, MD (Neurosurgery) Davina Poke (Optometry) Jake Bathe, MD (Cardiology) Phil Dopp, [facility] as Pharmacist (Pharmacist)  This Provider for this visit: Treatment Team:  Attending Provider: Kalman Shan, MD    05/10/2021 -   Chief Complaint  Patient presents with   Follow-up    PFT performed today.  Pt states he has been doing okay since last visit and denies any complaints.   02/14/2021 -  FU IPF with some emphysema. Has sCHF = - esbriet shipment at his home 03/23/21  - on esbiret 2 pills tid due to CKD hx (ofev 2nd choice due to his heart  issues)     HPI Luke [name] 77 y.o. -presents for follow-up.  Since his last visit he started pirfenidone.  After starting pirfenidone he developed COVID-19.  In the midst of all this he had to hold his COVID.  He also had diarrhea.  It was not fully clear in November 2022 whether the diarrhea was from COVID or from pirfenidone.  Nevertheless sometime around Thanksgiving he restarted pirfenidone.  He slowly escalated himself within a week to 2 pills 3 times daily.  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  IMPRESSION: 1. Spectrum of findings compatible with basilar predominant fibrotic interstitial lung disease without frank honeycombing, with mild progression at the lung bases since 03/25/2018 CT abdomen study, with more clear progression since 10/07/2011 CT abdomen study. Findings are categorized as probable UIP per consensus guidelines: Diagnosis of Idiopathic Pulmonary Fibrosis: An Official ATS/ERS/JRS/ALAT Clinical Practice Guideline. Am Rosezetta Schlatter Crit Care Med Vol 198, Iss 5, [id number], Jan 04 2017. 2. Scattered small solid pulmonary nodules, largest 5 mm.  Follow-up noncontrast chest CT recommended in 12 months. This recommendation follows the consensus statement: Guidelines for Management of Incidental Pulmonary Nodules Detected on CT Images: From the Fleischner Society 2017; Radiology 2017; 284:228-243. 3. Three-vessel coronary atherosclerosis. 4. Chronic atrophy of pancreatic body and tail, progressive since 2019 CT abdomen study, without discrete mass on this noncontrast CT, indeterminate but presumably due to progressive chronic pancreatitis. 5. Aortic Atherosclerosis (ICD10-I70.0) and Emphysema (ICD10-J43.9).     Electronically Signed   By: Delbert Phenix M.D.   On: 10/03/2020 16:53  SErology  Results for Luke [name], Luke [name]" (MRN [id number]) as of 01/02/2021 14:44  Ref. Range 11/13/2020 16:09  Anti Nuclear Antibody (ANA) Latest Ref Range: NEGATIVE  NEGATIVE  Angiotensin-Converting Enzyme Latest Ref Range: 9 - 67 U/L 27  Cyclic Citrullin Peptide Ab Latest Units: UNITS <16  ds DNA Ab Latest Units: IU/mL <1  Myeloperoxidase Abs Latest Units: AI <1.0  Serine Protease 3 Latest Units: AI <1.0  RA Latex Turbid. Latest Ref Range: <14 IU/mL <14  SSA (Ro) (ENA) Antibody, IgG Latest Ref Range: <1.0 NEG AI <1.0 NEG  SSB (La) (ENA) Antibody, IgG Latest Ref Range: <1.0 NEG AI <1.0 NEG  Scleroderma (Scl-70) (ENA) Antibody, IgG Latest Ref Range: <1.0 NEG AI <1.0 NEG    eCHO arch 2019  Study Conclusions   - Left ventricle: The cavity size was normal. Wall thickness was    normal. Systolic function was normal. The estimated ejection    fraction was in the range of 55% to 60%. Wall motion was normal;    there were no regional wall motion abnormalities. Doppler    parameters are consistent with abnormal left ventricular    relaxation (grade 1 diastolic dysfunction).  - Mitral valve: There was mild regurgitation.  - Pulmonary arteries: Systolic pressure was moderately increased.    PA peak pressure: 40 mm Hg (S).     OV 02/14/2021 -  telephine visit  Subjective:  Patient ID: Luke [name], male , DOB: [birth year] , age 48 y.o. , MRN: [id number] , ADDRESS: [address] [place] [address] PCP [name], MD Patient Care Team: [name], MD as PCP - General [name], MD (Vascular Surgery) Willis Modena, MD (Gastroenterology) Karie Soda, MD (General Surgery) Tia Alert, MD (Neurosurgery) Davina Poke [facility]) Jake Bathe, MD (Cardiology) Phil Dopp, [facility] as Pharmacist (Pharmacist)   Type of visit: Telephone/Video Circumstance: COVID-19 national emergency Identification of patient Luke [name] with 1946/04/13 and MRN [id number] - 2 person identifier Risks: Risks, benefits, limitations of telephone visit explained. Patient understood and verbalized agreement to proceed Anyone else on call: just patient Patient location: [phone] -  This provider location: [address], [place]  This Provider for this visit: Treatment Team:  Attending Provider: Kalman Shan, MD    02/14/2021 -  FU IPF with some emphysema. Has sCHF  HPI Luke [name] 77 y.o. -telephone visit to  PCP - Cardiology (Cardiology) [name], MD (Vascular Surgery) Willis Modena, MD (Gastroenterology) Karie Soda, MD (General Surgery) Tia Alert, MD (Neurosurgery) Davina Poke (Optometry) Jake Bathe, MD (Cardiology) Kathyrn Sheriff, [facility] as Pharmacist (Pharmacist)  This Provider for this visit: Treatment Team:  Attending Provider: Kalman Shan, MD     04/04/2022 -   Chief Complaint  Patient presents with   Follow-up    PFT performed today.  Pt states he has been doing okay since last visit and denies any complaints.     HPI Luke [name] 77 y.o. -returns for follow-up.  At this visit he had pulmonary function test that shows stability in FVC but his DLCO is decreased.  Symptom wise he feels stable.  He had a high-resolution CT chest that showed stability in the ILD for over a time.  He believes the DLCO decline is because of his heart failure because he feels good.  He is able to mow  his lawn and raking leaves.  Based on the decline in DLCO he is open to getting his BNP and also CBC checked.  He also wants his kidney function checked.  He is compliant with his medications.  Of note his lung nodules have resolved on the CT chest. LFT normal Nov 2023  He continues on Esbriet 2 pills 3 times daily. 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 - 10 days ago he also fell from the ladder and sprained his ankle     OV 07/29/2022  Subjective:  Patient ID: Luke [name], male , DOB: 1945/10/13 , age 31 y.o. , MRN: [id number] , ADDRESS: [address] [place] [address] PCP [name], MD Patient Care Team: [name], MD as PCP - General Jake Bathe, MD as PCP - Cardiology (Cardiology) [name], MD (Vascular Surgery) Willis Modena, MD (Gastroenterology) Karie Soda, MD (General Surgery) Tia Alert, MD (Neurosurgery) Davina Poke (Optometry) Jake Bathe, MD (Cardiology) Kathyrn Sheriff, [facility] as Pharmacist (Pharmacist)  This Provider for this visit: Treatment Team:  Attending Provider: Kalman Shan, MD   07/29/2022 -   Chief Complaint  Patient presents with   Follow-up    Review PFT today.  Doing well.  Using exercise bike at home and gym 3 days a week.     HPI Luke [name] 77 y.o. -returns for follow-up.  At his last visit he was feeling well and his CT scan was stable his FVC was stable but his DLCO had dropped.  Therefore we decided to bring him back a little bit early to recheck his pulmonary function test.  He now tells me at the last pulmonary function test he did not have his dentures and possibly there was an air leak.  Today his FVC and DLCO are all stable.  He feels good.  He is tolerating his pirfenidone well.  Weight is stable.  He is dealing with other issues other than IPF.  He says his right lower extremity one of the main arteries is got claudication again.  He has a right  foot callus that is now an ulcer.  He has to have surgery for both.  He is dealing with these issues.  Most recent liver function test was in February 2024 and pirfenidone and normal.  His current symptom scores are listed below.  His walking desaturation test is stable.        OV 01/09/2023  do well.  He is back on pirfenidone at 2 pills 3 times daily.  He does not want to escalate this further because of dizziness that he feels is because of valsartan and nitrates.  He has adjusted his valsartan and nitrate regimen but he still has dizziness.  With the pirfenidone he feels goofy.  He says the side effects overlapping but somewhat different.  He is able to differentiate the side effects between the 2 but certainly if he increases his pirfenidone he will get more dizzy.  His shortness of breath is stable his walking desaturation test is stable.  His pulmonary function test is stable.  His CT scan of the chest also shows stability and IPF over 1 year.  Of note he has some new nodules described in the right lower lobe in August 2023 CT chest.  A 68-month follow-up is  recommended.  He had recent liver function test and that is normal.   CT Chest data - HRCT Aug 2023  Narrative & Impression  CLINICAL DATA:  Interstitial lung disease   EXAM: CT CHEST WITHOUT CONTRAST   TECHNIQUE: Multidetector CT imaging of the chest was performed following the standard protocol without intravenous contrast. High resolution imaging of the lungs, as well as inspiratory and expiratory imaging, was performed.   RADIATION DOSE REDUCTION: This exam was performed according to the departmental dose-optimization program which includes automated exposure control, adjustment of the mA and/or kV according to patient size and/or use of iterative reconstruction technique.   COMPARISON:  Chest CT dated Oct 03, 2020   FINDINGS: Cardiovascular: Normal heart size. No pericardial effusion. Normal caliber thoracic aorta with severe calcified plaque. Severe coronary artery calcifications of the LAD. Partially visualized left chest wall pacer wires with leads in the right atrium, right ventricle and lead overlying the left ventricle.   Mediastinum/Nodes: Esophagus and thyroid are unremarkable. No pathologically enlarged lymph nodes seen in the chest.   Lungs/Pleura: Central airways are patent. No significant air trapping. Severe centrilobular emphysema. Subpleural and basilar predominant reticular opacities with associated ground-glass, traction bronchiectasis. Honeycomb change is seen in the medial right lower lobe on series 12 image 199, not significantly changed when compared with prior exam. No definite evidence of progressive fibrosis. New clustered part solid nodules of the right lower lobe, largest nodule measures 17 mm in overall diameter with 6 mm solid component, located on series 12, image 261.   Upper Abdomen: No acute abnormality.   Musculoskeletal: No chest wall mass or suspicious bone lesions identified.   IMPRESSION: 1. Basilar and subpleural  predominant fibrotic interstitial lung disease with traction bronchiectasis and honeycomb change. No evidence of progression. Findings are consistent with UIP per consensus guidelines: Diagnosis of Idiopathic Pulmonary Fibrosis: An Official ATS/ERS/JRS/ALAT Clinical Practice Guideline. Am Rosezetta Schlatter Crit Care Med Vol 198, Iss 5, [phone], Jan 04 2017. 2. New clustered part solid pulmonary nodules of the right lower lobe, likely due to infection or aspiration. Recommend follow-up chest CT in 3 months to ensure resolution. 3. Aortic Atherosclerosis (ICD10-I70.0) and Emphysema (ICD10-J43.9).     Electronically Signed   By: Allegra Lai M.D.   On: 12/07/2021 10:54    No results found.   OV 04/04/2022  Subjective:  Patient ID: Luke [name], male , DOB: 26-Jun-1945 , age 50 y.o. , MRN: [id number] , ADDRESS: [address] [place] [address] PCP [name], MD Patient Care Team: [name], MD as PCP - General Jake Bathe, MD as  PCP - Cardiology (Cardiology) [name], MD (Vascular Surgery) Willis Modena, MD (Gastroenterology) Karie Soda, MD (General Surgery) Tia Alert, MD (Neurosurgery) Davina Poke (Optometry) Jake Bathe, MD (Cardiology) Kathyrn Sheriff, [facility] as Pharmacist (Pharmacist)  This Provider for this visit: Treatment Team:  Attending Provider: Kalman Shan, MD     04/04/2022 -   Chief Complaint  Patient presents with   Follow-up    PFT performed today.  Pt states he has been doing okay since last visit and denies any complaints.     HPI Luke [name] 77 y.o. -returns for follow-up.  At this visit he had pulmonary function test that shows stability in FVC but his DLCO is decreased.  Symptom wise he feels stable.  He had a high-resolution CT chest that showed stability in the ILD for over a time.  He believes the DLCO decline is because of his heart failure because he feels good.  He is able to mow  his lawn and raking leaves.  Based on the decline in DLCO he is open to getting his BNP and also CBC checked.  He also wants his kidney function checked.  He is compliant with his medications.  Of note his lung nodules have resolved on the CT chest. LFT normal Nov 2023  He continues on Esbriet 2 pills 3 times daily. 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 - 10 days ago he also fell from the ladder and sprained his ankle     OV 07/29/2022  Subjective:  Patient ID: Luke [name], male , DOB: 1945/10/13 , age 31 y.o. , MRN: [id number] , ADDRESS: [address] [place] [address] PCP [name], MD Patient Care Team: [name], MD as PCP - General Jake Bathe, MD as PCP - Cardiology (Cardiology) [name], MD (Vascular Surgery) Willis Modena, MD (Gastroenterology) Karie Soda, MD (General Surgery) Tia Alert, MD (Neurosurgery) Davina Poke (Optometry) Jake Bathe, MD (Cardiology) Kathyrn Sheriff, [facility] as Pharmacist (Pharmacist)  This Provider for this visit: Treatment Team:  Attending Provider: Kalman Shan, MD   07/29/2022 -   Chief Complaint  Patient presents with   Follow-up    Review PFT today.  Doing well.  Using exercise bike at home and gym 3 days a week.     HPI Luke [name] 77 y.o. -returns for follow-up.  At his last visit he was feeling well and his CT scan was stable his FVC was stable but his DLCO had dropped.  Therefore we decided to bring him back a little bit early to recheck his pulmonary function test.  He now tells me at the last pulmonary function test he did not have his dentures and possibly there was an air leak.  Today his FVC and DLCO are all stable.  He feels good.  He is tolerating his pirfenidone well.  Weight is stable.  He is dealing with other issues other than IPF.  He says his right lower extremity one of the main arteries is got claudication again.  He has a right  foot callus that is now an ulcer.  He has to have surgery for both.  He is dealing with these issues.  Most recent liver function test was in February 2024 and pirfenidone and normal.  His current symptom scores are listed below.  His walking desaturation test is stable.        OV 01/09/2023  see how his esbriet started is going butt turns out he says he never got referred to pharmacist. Pharmacist confirmed that she is yet to meet iwht him. No esbriet started. Says even spirivan not started. Also, supposed to be telephone visit but got confusing call and he showed up at front desk only to be sent home. Apologized for gap in customer service. Overall well. He said after last vsit he read more about esbriet and got apprehensive esp in light of CKD but lab review shows GFR > 60 this year. 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  Instructions   - Slowly increase the dose per protocol  -Always take it with food  -Any nausea you can try ginger capsules  -Give at least between 5 and 6  hours between dosing  -Good idea to participate in [employer] sponsored medication support program - this is optional  -Definitely apply sunscreen when you go out with this medication  - You will need monthly liver tests for 6 months and thereafter every 3 months  - explained in decreaesd renal functio that side ffects could be higher but his GFR is good enough for full dose  - explained benefit of slowing fibrosis progression and that NNT of 1 to 6 is pretty good   We resolved we will jsu start and titrate up to max dose of 2 pills tid which is 2/3rd normal dose   CT Chest data  No results found.  Results for Luke [name], Luke [name]" (MRN [id number]) as of 02/14/2021 10:42  Ref. Range 10/23/2020 10:57  Creatinine Latest Ref Range: 0.76 - 1.27 mg/dL 4.09  Results for Luke [name], Luke [name]" (MRN [id number]) as of 02/14/2021 10:42  Ref. Range 01/02/2021 15:40  AST Latest Ref Range: 0 - 37 U/L 17  ALT Latest Ref Range: 0 - 53 U/L 16      OV 05/10/2021  Subjective:  Patient ID: Luke [name], male , DOB: [birth year] , age 20 y.o. , MRN: [id number] , ADDRESS: [address] [place] [address] PCP [name], MD Patient Care Team: [name], MD as PCP - General [name], MD (Vascular Surgery) Willis Modena, MD (Gastroenterology) Karie Soda, MD (General Surgery) Tia Alert, MD (Neurosurgery) Davina Poke (Optometry) Jake Bathe, MD (Cardiology) Phil Dopp, [facility] as Pharmacist (Pharmacist)  This Provider for this visit: Treatment Team:  Attending Provider: Kalman Shan, MD    05/10/2021 -   Chief Complaint  Patient presents with   Follow-up    PFT performed today.  Pt states he has been doing okay since last visit and denies any complaints.   02/14/2021 -  FU IPF with some emphysema. Has sCHF = - esbriet shipment at his home 03/23/21  - on esbiret 2 pills tid due to CKD hx (ofev 2nd choice due to his heart  issues)     HPI Luke [name] 77 y.o. -presents for follow-up.  Since his last visit he started pirfenidone.  After starting pirfenidone he developed COVID-19.  In the midst of all this he had to hold his COVID.  He also had diarrhea.  It was not fully clear in November 2022 whether the diarrhea was from COVID or from pirfenidone.  Nevertheless sometime around Thanksgiving he restarted pirfenidone.  He slowly escalated himself within a week to 2 pills 3 times daily.  He is by choice  see how his esbriet started is going butt turns out he says he never got referred to pharmacist. Pharmacist confirmed that she is yet to meet iwht him. No esbriet started. Says even spirivan not started. Also, supposed to be telephone visit but got confusing call and he showed up at front desk only to be sent home. Apologized for gap in customer service. Overall well. He said after last vsit he read more about esbriet and got apprehensive esp in light of CKD but lab review shows GFR > 60 this year. 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  Instructions   - Slowly increase the dose per protocol  -Always take it with food  -Any nausea you can try ginger capsules  -Give at least between 5 and 6  hours between dosing  -Good idea to participate in [employer] sponsored medication support program - this is optional  -Definitely apply sunscreen when you go out with this medication  - You will need monthly liver tests for 6 months and thereafter every 3 months  - explained in decreaesd renal functio that side ffects could be higher but his GFR is good enough for full dose  - explained benefit of slowing fibrosis progression and that NNT of 1 to 6 is pretty good   We resolved we will jsu start and titrate up to max dose of 2 pills tid which is 2/3rd normal dose   CT Chest data  No results found.  Results for Luke [name], Luke [name]" (MRN [id number]) as of 02/14/2021 10:42  Ref. Range 10/23/2020 10:57  Creatinine Latest Ref Range: 0.76 - 1.27 mg/dL 4.09  Results for Luke [name], Luke [name]" (MRN [id number]) as of 02/14/2021 10:42  Ref. Range 01/02/2021 15:40  AST Latest Ref Range: 0 - 37 U/L 17  ALT Latest Ref Range: 0 - 53 U/L 16      OV 05/10/2021  Subjective:  Patient ID: Luke [name], male , DOB: [birth year] , age 20 y.o. , MRN: [id number] , ADDRESS: [address] [place] [place] [address] PCP [name], MD Patient Care Team: [name], MD as PCP - General [name], MD (Vascular Surgery) Willis Modena, MD (Gastroenterology) Karie Soda, MD (General Surgery) Tia Alert, MD (Neurosurgery) Davina Poke (Optometry) Jake Bathe, MD (Cardiology) Phil Dopp, [facility] as Pharmacist (Pharmacist)  This Provider for this visit: Treatment Team:  Attending Provider: Kalman Shan, MD    05/10/2021 -   Chief Complaint  Patient presents with   Follow-up    PFT performed today.  Pt states he has been doing okay since last visit and denies any complaints.   02/14/2021 -  FU IPF with some emphysema. Has sCHF = - esbriet shipment at his home 03/23/21  - on esbiret 2 pills tid due to CKD hx (ofev 2nd choice due to his heart  issues)     HPI Luke [name] 77 y.o. -presents for follow-up.  Since his last visit he started pirfenidone.  After starting pirfenidone he developed COVID-19.  In the midst of all this he had to hold his COVID.  He also had diarrhea.  It was not fully clear in November 2022 whether the diarrhea was from COVID or from pirfenidone.  Nevertheless sometime around Thanksgiving he restarted pirfenidone.  He slowly escalated himself within a week to 2 pills 3 times daily.  He is by choice  do well.  He is back on pirfenidone at 2 pills 3 times daily.  He does not want to escalate this further because of dizziness that he feels is because of valsartan and nitrates.  He has adjusted his valsartan and nitrate regimen but he still has dizziness.  With the pirfenidone he feels goofy.  He says the side effects overlapping but somewhat different.  He is able to differentiate the side effects between the 2 but certainly if he increases his pirfenidone he will get more dizzy.  His shortness of breath is stable his walking desaturation test is stable.  His pulmonary function test is stable.  His CT scan of the chest also shows stability and IPF over 1 year.  Of note he has some new nodules described in the right lower lobe in August 2023 CT chest.  A 68-month follow-up is  recommended.  He had recent liver function test and that is normal.   CT Chest data - HRCT Aug 2023  Narrative & Impression  CLINICAL DATA:  Interstitial lung disease   EXAM: CT CHEST WITHOUT CONTRAST   TECHNIQUE: Multidetector CT imaging of the chest was performed following the standard protocol without intravenous contrast. High resolution imaging of the lungs, as well as inspiratory and expiratory imaging, was performed.   RADIATION DOSE REDUCTION: This exam was performed according to the departmental dose-optimization program which includes automated exposure control, adjustment of the mA and/or kV according to patient size and/or use of iterative reconstruction technique.   COMPARISON:  Chest CT dated Oct 03, 2020   FINDINGS: Cardiovascular: Normal heart size. No pericardial effusion. Normal caliber thoracic aorta with severe calcified plaque. Severe coronary artery calcifications of the LAD. Partially visualized left chest wall pacer wires with leads in the right atrium, right ventricle and lead overlying the left ventricle.   Mediastinum/Nodes: Esophagus and thyroid are unremarkable. No pathologically enlarged lymph nodes seen in the chest.   Lungs/Pleura: Central airways are patent. No significant air trapping. Severe centrilobular emphysema. Subpleural and basilar predominant reticular opacities with associated ground-glass, traction bronchiectasis. Honeycomb change is seen in the medial right lower lobe on series 12 image 199, not significantly changed when compared with prior exam. No definite evidence of progressive fibrosis. New clustered part solid nodules of the right lower lobe, largest nodule measures 17 mm in overall diameter with 6 mm solid component, located on series 12, image 261.   Upper Abdomen: No acute abnormality.   Musculoskeletal: No chest wall mass or suspicious bone lesions identified.   IMPRESSION: 1. Basilar and subpleural  predominant fibrotic interstitial lung disease with traction bronchiectasis and honeycomb change. No evidence of progression. Findings are consistent with UIP per consensus guidelines: Diagnosis of Idiopathic Pulmonary Fibrosis: An Official ATS/ERS/JRS/ALAT Clinical Practice Guideline. Am Rosezetta Schlatter Crit Care Med Vol 198, Iss 5, [phone], Jan 04 2017. 2. New clustered part solid pulmonary nodules of the right lower lobe, likely due to infection or aspiration. Recommend follow-up chest CT in 3 months to ensure resolution. 3. Aortic Atherosclerosis (ICD10-I70.0) and Emphysema (ICD10-J43.9).     Electronically Signed   By: Allegra Lai M.D.   On: 12/07/2021 10:54    No results found.   OV 04/04/2022  Subjective:  Patient ID: Luke [name], male , DOB: 26-Jun-1945 , age 50 y.o. , MRN: [id number] , ADDRESS: [address] [place] [address] PCP [name], MD Patient Care Team: [name], MD as PCP - General Jake Bathe, MD as  vegetarian/vegan diet.  He admits readily that he is a carnivore.  In the last meat-based diet.   OV 10/03/2021  Subjective:  Patient ID: Luke [name], male ,  DOB: 1945/07/03 , age 84 y.o. , MRN: [id number] , ADDRESS: [address] [place] [address] PCP [name], MD Patient Care Team: [name], MD as PCP - General Jake Bathe, MD as PCP - Cardiology (Cardiology) [name], MD (Vascular Surgery) Willis Modena, MD (Gastroenterology) Karie Soda, MD (General Surgery) Tia Alert, MD (Neurosurgery) Davina Poke (Optometry) Jake Bathe, MD (Cardiology) Phil Dopp, [facility] as Pharmacist (Pharmacist)  This Provider for this visit: Treatment Team:  Attending Provider: Kalman Shan, MD  02/14/2021 -  FU IPF with some emphysema. Has sCHF = - esbriet shipment at his home 03/23/21  - on esbiret 2 pills tid due to CKD hx (ofev kept as 2nd choice due to his heart issues)  -  esbriet on hold since mid march 2023 due to dizziness made by worse by nitrate and improved after stopping esbriet  - restart late may 2023  10/03/2021 -   Chief Complaint  Patient presents with   Follow-up    Pt states he has been doing okay since last visit and denies any real complaints.     HPI Luke [name] 77 y.o. -returns for follow-up.  He says he is doing well.  The symptoms are minimal.  He continues to exercise.  He says he has difficulty tolerating medication adds.  He has not been stable on the isosorbide which gave him dizziness in the setting of pirfenidone.  He stopped the pirfenidone although he attributed the dizziness to the isosorbide.  After stopping pirfenidone his dizziness went away.  Now for the last 20 days he is also been on valsartan.  He believes it was a new start.  For the last 1 week he is now back on pirfenidone at 1 pill 3 times daily.  There are no side effects right now.  He is trying to go to 2 pills 3 times daily [the maximum dose and account of his CKD].  He is worried that he can get side effects such as fogginess and headaches.  But he is willing to take it and monitor.  He is  interested in clinical trials but at this point in time his is.  Is not on a stable dose.  His symptom score is stable His pulmonary function test shows stability/improvement in FVC.  Last CT scan of the chest May 2022     OV 01/10/2022  Subjective:  Patient ID: Luke [name], male , DOB: [birth year] , age 62 y.o. , MRN: [id number] , ADDRESS: [address] [place] [address] PCP [name], MD Patient Care Team: [name], MD as PCP - General Jake Bathe, MD as PCP - Cardiology (Cardiology) [name], MD (Vascular Surgery) Willis Modena, MD (Gastroenterology) Karie Soda, MD (General Surgery) Tia Alert, MD (Neurosurgery) Davina Poke (Optometry) Jake Bathe, MD (Cardiology) Kathyrn Sheriff, [facility] as Pharmacist (Pharmacist)  This Provider for this visit: Treatment Team:  Attending Provider: Kalman Shan, MD    01/10/2022 -   Chief Complaint  Patient presents with   Follow-up    PFT performed today.  Pt states he has been dong okay since last visit and denies any complaints.   HPI Luke [name] 77 y.o. -returns for follow-up.  He continues to  see how his esbriet started is going butt turns out he says he never got referred to pharmacist. Pharmacist confirmed that she is yet to meet iwht him. No esbriet started. Says even spirivan not started. Also, supposed to be telephone visit but got confusing call and he showed up at front desk only to be sent home. Apologized for gap in customer service. Overall well. He said after last vsit he read more about esbriet and got apprehensive esp in light of CKD but lab review shows GFR > 60 this year. 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  Instructions   - Slowly increase the dose per protocol  -Always take it with food  -Any nausea you can try ginger capsules  -Give at least between 5 and 6  hours between dosing  -Good idea to participate in [employer] sponsored medication support program - this is optional  -Definitely apply sunscreen when you go out with this medication  - You will need monthly liver tests for 6 months and thereafter every 3 months  - explained in decreaesd renal functio that side ffects could be higher but his GFR is good enough for full dose  - explained benefit of slowing fibrosis progression and that NNT of 1 to 6 is pretty good   We resolved we will jsu start and titrate up to max dose of 2 pills tid which is 2/3rd normal dose   CT Chest data  No results found.  Results for Luke [name], Luke [name]" (MRN [id number]) as of 02/14/2021 10:42  Ref. Range 10/23/2020 10:57  Creatinine Latest Ref Range: 0.76 - 1.27 mg/dL 4.09  Results for Luke [name], Luke [name]" (MRN [id number]) as of 02/14/2021 10:42  Ref. Range 01/02/2021 15:40  AST Latest Ref Range: 0 - 37 U/L 17  ALT Latest Ref Range: 0 - 53 U/L 16      OV 05/10/2021  Subjective:  Patient ID: Luke [name], male , DOB: [birth year] , age 20 y.o. , MRN: [id number] , ADDRESS: [address] [place] [address] PCP [name], MD Patient Care Team: [name], MD as PCP - General [name], MD (Vascular Surgery) Willis Modena, MD (Gastroenterology) Karie Soda, MD (General Surgery) Tia Alert, MD (Neurosurgery) Davina Poke (Optometry) Jake Bathe, MD (Cardiology) Phil Dopp, [facility] as Pharmacist (Pharmacist)  This Provider for this visit: Treatment Team:  Attending Provider: Kalman Shan, MD    05/10/2021 -   Chief Complaint  Patient presents with   Follow-up    PFT performed today.  Pt states he has been doing okay since last visit and denies any complaints.   02/14/2021 -  FU IPF with some emphysema. Has sCHF = - esbriet shipment at his home 03/23/21  - on esbiret 2 pills tid due to CKD hx (ofev 2nd choice due to his heart  issues)     HPI Luke [name] 77 y.o. -presents for follow-up.  Since his last visit he started pirfenidone.  After starting pirfenidone he developed COVID-19.  In the midst of all this he had to hold his COVID.  He also had diarrhea.  It was not fully clear in November 2022 whether the diarrhea was from COVID or from pirfenidone.  Nevertheless sometime around Thanksgiving he restarted pirfenidone.  He slowly escalated himself within a week to 2 pills 3 times daily.  He is by choice  see how his esbriet started is going butt turns out he says he never got referred to pharmacist. Pharmacist confirmed that she is yet to meet iwht him. No esbriet started. Says even spirivan not started. Also, supposed to be telephone visit but got confusing call and he showed up at front desk only to be sent home. Apologized for gap in customer service. Overall well. He said after last vsit he read more about esbriet and got apprehensive esp in light of CKD but lab review shows GFR > 60 this year. 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  Instructions   - Slowly increase the dose per protocol  -Always take it with food  -Any nausea you can try ginger capsules  -Give at least between 5 and 6  hours between dosing  -Good idea to participate in [employer] sponsored medication support program - this is optional  -Definitely apply sunscreen when you go out with this medication  - You will need monthly liver tests for 6 months and thereafter every 3 months  - explained in decreaesd renal functio that side ffects could be higher but his GFR is good enough for full dose  - explained benefit of slowing fibrosis progression and that NNT of 1 to 6 is pretty good   We resolved we will jsu start and titrate up to max dose of 2 pills tid which is 2/3rd normal dose   CT Chest data  No results found.  Results for Luke [name], Luke [name]" (MRN [id number]) as of 02/14/2021 10:42  Ref. Range 10/23/2020 10:57  Creatinine Latest Ref Range: 0.76 - 1.27 mg/dL 4.09  Results for Luke [name], Luke [name]" (MRN [id number]) as of 02/14/2021 10:42  Ref. Range 01/02/2021 15:40  AST Latest Ref Range: 0 - 37 U/L 17  ALT Latest Ref Range: 0 - 53 U/L 16      OV 05/10/2021  Subjective:  Patient ID: Luke [name], male , DOB: [birth year] , age 20 y.o. , MRN: [id number] , ADDRESS: [address] [place] [address] PCP [name], MD Patient Care Team: [name], MD as PCP - General [name], MD (Vascular Surgery) Willis Modena, MD (Gastroenterology) Karie Soda, MD (General Surgery) Tia Alert, MD (Neurosurgery) Davina Poke (Optometry) Jake Bathe, MD (Cardiology) Phil Dopp, [facility] as Pharmacist (Pharmacist)  This Provider for this visit: Treatment Team:  Attending Provider: Kalman Shan, MD    05/10/2021 -   Chief Complaint  Patient presents with   Follow-up    PFT performed today.  Pt states he has been doing okay since last visit and denies any complaints.   02/14/2021 -  FU IPF with some emphysema. Has sCHF = - esbriet shipment at his home 03/23/21  - on esbiret 2 pills tid due to CKD hx (ofev 2nd choice due to his heart  issues)     HPI Luke [name] 77 y.o. -presents for follow-up.  Since his last visit he started pirfenidone.  After starting pirfenidone he developed COVID-19.  In the midst of all this he had to hold his COVID.  He also had diarrhea.  It was not fully clear in November 2022 whether the diarrhea was from COVID or from pirfenidone.  Nevertheless sometime around Thanksgiving he restarted pirfenidone.  He slowly escalated himself within a week to 2 pills 3 times daily.  He is by choice  do well.  He is back on pirfenidone at 2 pills 3 times daily.  He does not want to escalate this further because of dizziness that he feels is because of valsartan and nitrates.  He has adjusted his valsartan and nitrate regimen but he still has dizziness.  With the pirfenidone he feels goofy.  He says the side effects overlapping but somewhat different.  He is able to differentiate the side effects between the 2 but certainly if he increases his pirfenidone he will get more dizzy.  His shortness of breath is stable his walking desaturation test is stable.  His pulmonary function test is stable.  His CT scan of the chest also shows stability and IPF over 1 year.  Of note he has some new nodules described in the right lower lobe in August 2023 CT chest.  A 68-month follow-up is  recommended.  He had recent liver function test and that is normal.   CT Chest data - HRCT Aug 2023  Narrative & Impression  CLINICAL DATA:  Interstitial lung disease   EXAM: CT CHEST WITHOUT CONTRAST   TECHNIQUE: Multidetector CT imaging of the chest was performed following the standard protocol without intravenous contrast. High resolution imaging of the lungs, as well as inspiratory and expiratory imaging, was performed.   RADIATION DOSE REDUCTION: This exam was performed according to the departmental dose-optimization program which includes automated exposure control, adjustment of the mA and/or kV according to patient size and/or use of iterative reconstruction technique.   COMPARISON:  Chest CT dated Oct 03, 2020   FINDINGS: Cardiovascular: Normal heart size. No pericardial effusion. Normal caliber thoracic aorta with severe calcified plaque. Severe coronary artery calcifications of the LAD. Partially visualized left chest wall pacer wires with leads in the right atrium, right ventricle and lead overlying the left ventricle.   Mediastinum/Nodes: Esophagus and thyroid are unremarkable. No pathologically enlarged lymph nodes seen in the chest.   Lungs/Pleura: Central airways are patent. No significant air trapping. Severe centrilobular emphysema. Subpleural and basilar predominant reticular opacities with associated ground-glass, traction bronchiectasis. Honeycomb change is seen in the medial right lower lobe on series 12 image 199, not significantly changed when compared with prior exam. No definite evidence of progressive fibrosis. New clustered part solid nodules of the right lower lobe, largest nodule measures 17 mm in overall diameter with 6 mm solid component, located on series 12, image 261.   Upper Abdomen: No acute abnormality.   Musculoskeletal: No chest wall mass or suspicious bone lesions identified.   IMPRESSION: 1. Basilar and subpleural  predominant fibrotic interstitial lung disease with traction bronchiectasis and honeycomb change. No evidence of progression. Findings are consistent with UIP per consensus guidelines: Diagnosis of Idiopathic Pulmonary Fibrosis: An Official ATS/ERS/JRS/ALAT Clinical Practice Guideline. Am Rosezetta Schlatter Crit Care Med Vol 198, Iss 5, [phone], Jan 04 2017. 2. New clustered part solid pulmonary nodules of the right lower lobe, likely due to infection or aspiration. Recommend follow-up chest CT in 3 months to ensure resolution. 3. Aortic Atherosclerosis (ICD10-I70.0) and Emphysema (ICD10-J43.9).     Electronically Signed   By: Allegra Lai M.D.   On: 12/07/2021 10:54    No results found.   OV 04/04/2022  Subjective:  Patient ID: Luke [name], male , DOB: 26-Jun-1945 , age 50 y.o. , MRN: [id number] , ADDRESS: [address] [place] [address] PCP [name], MD Patient Care Team: [name], MD as PCP - General Jake Bathe, MD as  do well.  He is back on pirfenidone at 2 pills 3 times daily.  He does not want to escalate this further because of dizziness that he feels is because of valsartan and nitrates.  He has adjusted his valsartan and nitrate regimen but he still has dizziness.  With the pirfenidone he feels goofy.  He says the side effects overlapping but somewhat different.  He is able to differentiate the side effects between the 2 but certainly if he increases his pirfenidone he will get more dizzy.  His shortness of breath is stable his walking desaturation test is stable.  His pulmonary function test is stable.  His CT scan of the chest also shows stability and IPF over 1 year.  Of note he has some new nodules described in the right lower lobe in August 2023 CT chest.  A 68-month follow-up is  recommended.  He had recent liver function test and that is normal.   CT Chest data - HRCT Aug 2023  Narrative & Impression  CLINICAL DATA:  Interstitial lung disease   EXAM: CT CHEST WITHOUT CONTRAST   TECHNIQUE: Multidetector CT imaging of the chest was performed following the standard protocol without intravenous contrast. High resolution imaging of the lungs, as well as inspiratory and expiratory imaging, was performed.   RADIATION DOSE REDUCTION: This exam was performed according to the departmental dose-optimization program which includes automated exposure control, adjustment of the mA and/or kV according to patient size and/or use of iterative reconstruction technique.   COMPARISON:  Chest CT dated Oct 03, 2020   FINDINGS: Cardiovascular: Normal heart size. No pericardial effusion. Normal caliber thoracic aorta with severe calcified plaque. Severe coronary artery calcifications of the LAD. Partially visualized left chest wall pacer wires with leads in the right atrium, right ventricle and lead overlying the left ventricle.   Mediastinum/Nodes: Esophagus and thyroid are unremarkable. No pathologically enlarged lymph nodes seen in the chest.   Lungs/Pleura: Central airways are patent. No significant air trapping. Severe centrilobular emphysema. Subpleural and basilar predominant reticular opacities with associated ground-glass, traction bronchiectasis. Honeycomb change is seen in the medial right lower lobe on series 12 image 199, not significantly changed when compared with prior exam. No definite evidence of progressive fibrosis. New clustered part solid nodules of the right lower lobe, largest nodule measures 17 mm in overall diameter with 6 mm solid component, located on series 12, image 261.   Upper Abdomen: No acute abnormality.   Musculoskeletal: No chest wall mass or suspicious bone lesions identified.   IMPRESSION: 1. Basilar and subpleural  predominant fibrotic interstitial lung disease with traction bronchiectasis and honeycomb change. No evidence of progression. Findings are consistent with UIP per consensus guidelines: Diagnosis of Idiopathic Pulmonary Fibrosis: An Official ATS/ERS/JRS/ALAT Clinical Practice Guideline. Am Rosezetta Schlatter Crit Care Med Vol 198, Iss 5, [phone], Jan 04 2017. 2. New clustered part solid pulmonary nodules of the right lower lobe, likely due to infection or aspiration. Recommend follow-up chest CT in 3 months to ensure resolution. 3. Aortic Atherosclerosis (ICD10-I70.0) and Emphysema (ICD10-J43.9).     Electronically Signed   By: Allegra Lai M.D.   On: 12/07/2021 10:54    No results found.   OV 04/04/2022  Subjective:  Patient ID: Luke [name], male , DOB: 26-Jun-1945 , age 50 y.o. , MRN: [id number] , ADDRESS: [address] [place] [address] PCP [name], MD Patient Care Team: [name], MD as PCP - General Jake Bathe, MD as  do well.  He is back on pirfenidone at 2 pills 3 times daily.  He does not want to escalate this further because of dizziness that he feels is because of valsartan and nitrates.  He has adjusted his valsartan and nitrate regimen but he still has dizziness.  With the pirfenidone he feels goofy.  He says the side effects overlapping but somewhat different.  He is able to differentiate the side effects between the 2 but certainly if he increases his pirfenidone he will get more dizzy.  His shortness of breath is stable his walking desaturation test is stable.  His pulmonary function test is stable.  His CT scan of the chest also shows stability and IPF over 1 year.  Of note he has some new nodules described in the right lower lobe in August 2023 CT chest.  A 68-month follow-up is  recommended.  He had recent liver function test and that is normal.   CT Chest data - HRCT Aug 2023  Narrative & Impression  CLINICAL DATA:  Interstitial lung disease   EXAM: CT CHEST WITHOUT CONTRAST   TECHNIQUE: Multidetector CT imaging of the chest was performed following the standard protocol without intravenous contrast. High resolution imaging of the lungs, as well as inspiratory and expiratory imaging, was performed.   RADIATION DOSE REDUCTION: This exam was performed according to the departmental dose-optimization program which includes automated exposure control, adjustment of the mA and/or kV according to patient size and/or use of iterative reconstruction technique.   COMPARISON:  Chest CT dated Oct 03, 2020   FINDINGS: Cardiovascular: Normal heart size. No pericardial effusion. Normal caliber thoracic aorta with severe calcified plaque. Severe coronary artery calcifications of the LAD. Partially visualized left chest wall pacer wires with leads in the right atrium, right ventricle and lead overlying the left ventricle.   Mediastinum/Nodes: Esophagus and thyroid are unremarkable. No pathologically enlarged lymph nodes seen in the chest.   Lungs/Pleura: Central airways are patent. No significant air trapping. Severe centrilobular emphysema. Subpleural and basilar predominant reticular opacities with associated ground-glass, traction bronchiectasis. Honeycomb change is seen in the medial right lower lobe on series 12 image 199, not significantly changed when compared with prior exam. No definite evidence of progressive fibrosis. New clustered part solid nodules of the right lower lobe, largest nodule measures 17 mm in overall diameter with 6 mm solid component, located on series 12, image 261.   Upper Abdomen: No acute abnormality.   Musculoskeletal: No chest wall mass or suspicious bone lesions identified.   IMPRESSION: 1. Basilar and subpleural  predominant fibrotic interstitial lung disease with traction bronchiectasis and honeycomb change. No evidence of progression. Findings are consistent with UIP per consensus guidelines: Diagnosis of Idiopathic Pulmonary Fibrosis: An Official ATS/ERS/JRS/ALAT Clinical Practice Guideline. Am Rosezetta Schlatter Crit Care Med Vol 198, Iss 5, [phone], Jan 04 2017. 2. New clustered part solid pulmonary nodules of the right lower lobe, likely due to infection or aspiration. Recommend follow-up chest CT in 3 months to ensure resolution. 3. Aortic Atherosclerosis (ICD10-I70.0) and Emphysema (ICD10-J43.9).     Electronically Signed   By: Allegra Lai M.D.   On: 12/07/2021 10:54    No results found.   OV 04/04/2022  Subjective:  Patient ID: Luke [name], male , DOB: 26-Jun-1945 , age 50 y.o. , MRN: [id number] , ADDRESS: [address] 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  IMPRESSION: 1. Spectrum of findings compatible with basilar predominant fibrotic interstitial lung disease without frank honeycombing, with mild progression at the lung bases since 03/25/2018 CT abdomen study, with more clear progression since 10/07/2011 CT abdomen study. Findings are categorized as probable UIP per consensus guidelines: Diagnosis of Idiopathic Pulmonary Fibrosis: An Official ATS/ERS/JRS/ALAT Clinical Practice Guideline. Am Rosezetta Schlatter Crit Care Med Vol 198, Iss 5, [id number], Jan 04 2017. 2. Scattered small solid pulmonary nodules, largest 5 mm.  Follow-up noncontrast chest CT recommended in 12 months. This recommendation follows the consensus statement: Guidelines for Management of Incidental Pulmonary Nodules Detected on CT Images: From the Fleischner Society 2017; Radiology 2017; 284:228-243. 3. Three-vessel coronary atherosclerosis. 4. Chronic atrophy of pancreatic body and tail, progressive since 2019 CT abdomen study, without discrete mass on this noncontrast CT, indeterminate but presumably due to progressive chronic pancreatitis. 5. Aortic Atherosclerosis (ICD10-I70.0) and Emphysema (ICD10-J43.9).     Electronically Signed   By: Delbert Phenix M.D.   On: 10/03/2020 16:53  SErology  Results for Luke [name], Luke [name]" (MRN [id number]) as of 01/02/2021 14:44  Ref. Range 11/13/2020 16:09  Anti Nuclear Antibody (ANA) Latest Ref Range: NEGATIVE  NEGATIVE  Angiotensin-Converting Enzyme Latest Ref Range: 9 - 67 U/L 27  Cyclic Citrullin Peptide Ab Latest Units: UNITS <16  ds DNA Ab Latest Units: IU/mL <1  Myeloperoxidase Abs Latest Units: AI <1.0  Serine Protease 3 Latest Units: AI <1.0  RA Latex Turbid. Latest Ref Range: <14 IU/mL <14  SSA (Ro) (ENA) Antibody, IgG Latest Ref Range: <1.0 NEG AI <1.0 NEG  SSB (La) (ENA) Antibody, IgG Latest Ref Range: <1.0 NEG AI <1.0 NEG  Scleroderma (Scl-70) (ENA) Antibody, IgG Latest Ref Range: <1.0 NEG AI <1.0 NEG    eCHO arch 2019  Study Conclusions   - Left ventricle: The cavity size was normal. Wall thickness was    normal. Systolic function was normal. The estimated ejection    fraction was in the range of 55% to 60%. Wall motion was normal;    there were no regional wall motion abnormalities. Doppler    parameters are consistent with abnormal left ventricular    relaxation (grade 1 diastolic dysfunction).  - Mitral valve: There was mild regurgitation.  - Pulmonary arteries: Systolic pressure was moderately increased.    PA peak pressure: 40 mm Hg (S).     OV 02/14/2021 -  telephine visit  Subjective:  Patient ID: Luke [name], male , DOB: [birth year] , age 48 y.o. , MRN: [id number] , ADDRESS: [address] [place] [address] PCP [name], MD Patient Care Team: [name], MD as PCP - General [name], MD (Vascular Surgery) Willis Modena, MD (Gastroenterology) Karie Soda, MD (General Surgery) Tia Alert, MD (Neurosurgery) Davina Poke [facility]) Jake Bathe, MD (Cardiology) Phil Dopp, [facility] as Pharmacist (Pharmacist)   Type of visit: Telephone/Video Circumstance: COVID-19 national emergency Identification of patient Luke [name] with 1946/04/13 and MRN [id number] - 2 person identifier Risks: Risks, benefits, limitations of telephone visit explained. Patient understood and verbalized agreement to proceed Anyone else on call: just patient Patient location: [phone] -  This provider location: [address], [place]  This Provider for this visit: Treatment Team:  Attending Provider: Kalman Shan, MD    02/14/2021 -  FU IPF with some emphysema. Has sCHF  HPI Luke [name] 77 y.o. -telephone visit to  vegetarian/vegan diet.  He admits readily that he is a carnivore.  In the last meat-based diet.   OV 10/03/2021  Subjective:  Patient ID: Luke [name], male ,  DOB: 1945/07/03 , age 84 y.o. , MRN: [id number] , ADDRESS: [address] [place] [address] PCP [name], MD Patient Care Team: [name], MD as PCP - General Jake Bathe, MD as PCP - Cardiology (Cardiology) [name], MD (Vascular Surgery) Willis Modena, MD (Gastroenterology) Karie Soda, MD (General Surgery) Tia Alert, MD (Neurosurgery) Davina Poke (Optometry) Jake Bathe, MD (Cardiology) Phil Dopp, [facility] as Pharmacist (Pharmacist)  This Provider for this visit: Treatment Team:  Attending Provider: Kalman Shan, MD  02/14/2021 -  FU IPF with some emphysema. Has sCHF = - esbriet shipment at his home 03/23/21  - on esbiret 2 pills tid due to CKD hx (ofev kept as 2nd choice due to his heart issues)  -  esbriet on hold since mid march 2023 due to dizziness made by worse by nitrate and improved after stopping esbriet  - restart late may 2023  10/03/2021 -   Chief Complaint  Patient presents with   Follow-up    Pt states he has been doing okay since last visit and denies any real complaints.     HPI Luke [name] 77 y.o. -returns for follow-up.  He says he is doing well.  The symptoms are minimal.  He continues to exercise.  He says he has difficulty tolerating medication adds.  He has not been stable on the isosorbide which gave him dizziness in the setting of pirfenidone.  He stopped the pirfenidone although he attributed the dizziness to the isosorbide.  After stopping pirfenidone his dizziness went away.  Now for the last 20 days he is also been on valsartan.  He believes it was a new start.  For the last 1 week he is now back on pirfenidone at 1 pill 3 times daily.  There are no side effects right now.  He is trying to go to 2 pills 3 times daily [the maximum dose and account of his CKD].  He is worried that he can get side effects such as fogginess and headaches.  But he is willing to take it and monitor.  He is  interested in clinical trials but at this point in time his is.  Is not on a stable dose.  His symptom score is stable His pulmonary function test shows stability/improvement in FVC.  Last CT scan of the chest May 2022     OV 01/10/2022  Subjective:  Patient ID: Luke [name], male , DOB: [birth year] , age 62 y.o. , MRN: [id number] , ADDRESS: [address] [place] [address] PCP [name], MD Patient Care Team: [name], MD as PCP - General Jake Bathe, MD as PCP - Cardiology (Cardiology) [name], MD (Vascular Surgery) Willis Modena, MD (Gastroenterology) Karie Soda, MD (General Surgery) Tia Alert, MD (Neurosurgery) Davina Poke (Optometry) Jake Bathe, MD (Cardiology) Kathyrn Sheriff, [facility] as Pharmacist (Pharmacist)  This Provider for this visit: Treatment Team:  Attending Provider: Kalman Shan, MD    01/10/2022 -   Chief Complaint  Patient presents with   Follow-up    PFT performed today.  Pt states he has been dong okay since last visit and denies any complaints.   HPI Luke [name] 77 y.o. -returns for follow-up.  He continues to  do well.  He is back on pirfenidone at 2 pills 3 times daily.  He does not want to escalate this further because of dizziness that he feels is because of valsartan and nitrates.  He has adjusted his valsartan and nitrate regimen but he still has dizziness.  With the pirfenidone he feels goofy.  He says the side effects overlapping but somewhat different.  He is able to differentiate the side effects between the 2 but certainly if he increases his pirfenidone he will get more dizzy.  His shortness of breath is stable his walking desaturation test is stable.  His pulmonary function test is stable.  His CT scan of the chest also shows stability and IPF over 1 year.  Of note he has some new nodules described in the right lower lobe in August 2023 CT chest.  A 68-month follow-up is  recommended.  He had recent liver function test and that is normal.   CT Chest data - HRCT Aug 2023  Narrative & Impression  CLINICAL DATA:  Interstitial lung disease   EXAM: CT CHEST WITHOUT CONTRAST   TECHNIQUE: Multidetector CT imaging of the chest was performed following the standard protocol without intravenous contrast. High resolution imaging of the lungs, as well as inspiratory and expiratory imaging, was performed.   RADIATION DOSE REDUCTION: This exam was performed according to the departmental dose-optimization program which includes automated exposure control, adjustment of the mA and/or kV according to patient size and/or use of iterative reconstruction technique.   COMPARISON:  Chest CT dated Oct 03, 2020   FINDINGS: Cardiovascular: Normal heart size. No pericardial effusion. Normal caliber thoracic aorta with severe calcified plaque. Severe coronary artery calcifications of the LAD. Partially visualized left chest wall pacer wires with leads in the right atrium, right ventricle and lead overlying the left ventricle.   Mediastinum/Nodes: Esophagus and thyroid are unremarkable. No pathologically enlarged lymph nodes seen in the chest.   Lungs/Pleura: Central airways are patent. No significant air trapping. Severe centrilobular emphysema. Subpleural and basilar predominant reticular opacities with associated ground-glass, traction bronchiectasis. Honeycomb change is seen in the medial right lower lobe on series 12 image 199, not significantly changed when compared with prior exam. No definite evidence of progressive fibrosis. New clustered part solid nodules of the right lower lobe, largest nodule measures 17 mm in overall diameter with 6 mm solid component, located on series 12, image 261.   Upper Abdomen: No acute abnormality.   Musculoskeletal: No chest wall mass or suspicious bone lesions identified.   IMPRESSION: 1. Basilar and subpleural  predominant fibrotic interstitial lung disease with traction bronchiectasis and honeycomb change. No evidence of progression. Findings are consistent with UIP per consensus guidelines: Diagnosis of Idiopathic Pulmonary Fibrosis: An Official ATS/ERS/JRS/ALAT Clinical Practice Guideline. Am Rosezetta Schlatter Crit Care Med Vol 198, Iss 5, [phone], Jan 04 2017. 2. New clustered part solid pulmonary nodules of the right lower lobe, likely due to infection or aspiration. Recommend follow-up chest CT in 3 months to ensure resolution. 3. Aortic Atherosclerosis (ICD10-I70.0) and Emphysema (ICD10-J43.9).     Electronically Signed   By: Allegra Lai M.D.   On: 12/07/2021 10:54    No results found.   OV 04/04/2022  Subjective:  Patient ID: Luke [name], male , DOB: 26-Jun-1945 , age 50 y.o. , MRN: [id number] , ADDRESS: [address] [place] [address] PCP [name], MD Patient Care Team: [name], MD as PCP - General Jake Bathe, MD as  PCP - Cardiology (Cardiology) [name], MD (Vascular Surgery) Willis Modena, MD (Gastroenterology) Karie Soda, MD (General Surgery) Tia Alert, MD (Neurosurgery) Davina Poke (Optometry) Jake Bathe, MD (Cardiology) Kathyrn Sheriff, [facility] as Pharmacist (Pharmacist)  This Provider for this visit: Treatment Team:  Attending Provider: Kalman Shan, MD     04/04/2022 -   Chief Complaint  Patient presents with   Follow-up    PFT performed today.  Pt states he has been doing okay since last visit and denies any complaints.     HPI Luke [name] 77 y.o. -returns for follow-up.  At this visit he had pulmonary function test that shows stability in FVC but his DLCO is decreased.  Symptom wise he feels stable.  He had a high-resolution CT chest that showed stability in the ILD for over a time.  He believes the DLCO decline is because of his heart failure because he feels good.  He is able to mow  his lawn and raking leaves.  Based on the decline in DLCO he is open to getting his BNP and also CBC checked.  He also wants his kidney function checked.  He is compliant with his medications.  Of note his lung nodules have resolved on the CT chest. LFT normal Nov 2023  He continues on Esbriet 2 pills 3 times daily. 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 - 10 days ago he also fell from the ladder and sprained his ankle     OV 07/29/2022  Subjective:  Patient ID: Luke [name], male , DOB: 1945/10/13 , age 31 y.o. , MRN: [id number] , ADDRESS: [address] [place] [address] PCP [name], MD Patient Care Team: [name], MD as PCP - General Jake Bathe, MD as PCP - Cardiology (Cardiology) [name], MD (Vascular Surgery) Willis Modena, MD (Gastroenterology) Karie Soda, MD (General Surgery) Tia Alert, MD (Neurosurgery) Davina Poke (Optometry) Jake Bathe, MD (Cardiology) Kathyrn Sheriff, [facility] as Pharmacist (Pharmacist)  This Provider for this visit: Treatment Team:  Attending Provider: Kalman Shan, MD   07/29/2022 -   Chief Complaint  Patient presents with   Follow-up    Review PFT today.  Doing well.  Using exercise bike at home and gym 3 days a week.     HPI Luke [name] 77 y.o. -returns for follow-up.  At his last visit he was feeling well and his CT scan was stable his FVC was stable but his DLCO had dropped.  Therefore we decided to bring him back a little bit early to recheck his pulmonary function test.  He now tells me at the last pulmonary function test he did not have his dentures and possibly there was an air leak.  Today his FVC and DLCO are all stable.  He feels good.  He is tolerating his pirfenidone well.  Weight is stable.  He is dealing with other issues other than IPF.  He says his right lower extremity one of the main arteries is got claudication again.  He has a right  foot callus that is now an ulcer.  He has to have surgery for both.  He is dealing with these issues.  Most recent liver function test was in February 2024 and pirfenidone and normal.  His current symptom scores are listed below.  His walking desaturation test is stable.        OV 01/09/2023

## 2023-01-22 NOTE — Patient Instructions (Incomplete)
ICD-10-CM   1. IPF (idiopathic pulmonary fibrosis) (HCC)  J84.112     2. Centrilobular emphysema (HCC)  J43.2     3. Medication monitoring encounter  Z51.81     4. Exercise hypoxemia  R09.02          Re IPF  -Concerned you are getting worse because of worsening shortness of breath, pulmonary function test and new exercise hypoxemia.  Alternatively could be heart failure but current CT chest suggest fibrosis  is getting worse (official report pending)  Plan - cotninue esbriet  at 2 pill thress times daily; take with food -Await results of high-resolution CT chest within the next 1 to 2 weeks -If indeed pulmonary fibrosis is getting worse then clinical trials as a care option is the next best option -Start portable oxygen system 2 L nasal cannula  = sorry for missing order last visit; CMA to do it 01/23/2023 - Continue nighttime oxygen as before   Emphysema (minor component)   Stable  Plan -continue spiriva respimat 2 puff daily  - continue  albuterol prn  Lung nodules right - cluster - new Aug 2023 - resolved NOv 2023  Plan  -reassure  Rash  - per you is due to lasix though we need to be open minded could be due to esbriet  Plan  - monitor off lasix   Followup -  --  face to face vsiit Dr. Marchelle Gearing [ideally] or nurse practitioner in the next 6 weeks

## 2023-01-23 ENCOUNTER — Encounter: Payer: Self-pay | Admitting: Internal Medicine

## 2023-01-23 ENCOUNTER — Encounter (HOSPITAL_BASED_OUTPATIENT_CLINIC_OR_DEPARTMENT_OTHER): Payer: Self-pay | Admitting: Cardiology

## 2023-01-23 ENCOUNTER — Ambulatory Visit (HOSPITAL_BASED_OUTPATIENT_CLINIC_OR_DEPARTMENT_OTHER): Payer: Medicare Other | Admitting: Cardiology

## 2023-01-23 ENCOUNTER — Telehealth: Payer: Medicare Other | Admitting: Internal Medicine

## 2023-01-23 VITALS — BP 164/78 | HR 77 | Ht 72.0 in | Wt 175.0 lb

## 2023-01-23 DIAGNOSIS — R21 Rash and other nonspecific skin eruption: Secondary | ICD-10-CM | POA: Diagnosis not present

## 2023-01-23 DIAGNOSIS — Z955 Presence of coronary angioplasty implant and graft: Secondary | ICD-10-CM | POA: Diagnosis not present

## 2023-01-23 DIAGNOSIS — I5042 Chronic combined systolic (congestive) and diastolic (congestive) heart failure: Secondary | ICD-10-CM

## 2023-01-23 DIAGNOSIS — Z95810 Presence of automatic (implantable) cardiac defibrillator: Secondary | ICD-10-CM | POA: Diagnosis not present

## 2023-01-23 DIAGNOSIS — R0902 Hypoxemia: Secondary | ICD-10-CM | POA: Diagnosis not present

## 2023-01-23 DIAGNOSIS — J84112 Idiopathic pulmonary fibrosis: Secondary | ICD-10-CM | POA: Diagnosis not present

## 2023-01-23 MED ORDER — VALSARTAN 40 MG PO TABS: 40.0000 mg | ORAL_TABLET | Freq: Every day | ORAL | 1 refills | Status: DC

## 2023-01-23 NOTE — Patient Instructions (Signed)
Medication Instructions:  Please discontinue your Amlodipine and Isosorbide. Increase Valsartan to 40 mg one tablet daily. Continue all other medications as listed.  *If you need a refill on your cardiac medications before your next appointment, please call your pharmacy*  Follow-Up: At [facility], you and your health needs are our priority.  As part of our continuing mission to provide you with exceptional heart care, we have created designated Provider Care Teams.  These Care Teams include your primary Cardiologist (physician) and Advanced Practice Providers (APPs -  Physician Assistants and Nurse Practitioners) who all work together to provide you with the care you need, when you need it.  We recommend signing up for the patient portal called "MyChart".  Sign up information is provided on this After Visit Summary.  MyChart is used to connect with patients for Virtual Visits (Telemedicine).  Patients are able to view lab/test results, encounter notes, upcoming appointments, etc.  Non-urgent messages can be sent to your provider as well.   To learn more about what you can do with MyChart, go to [email or web address].    Your next appointment:   1 year(s)  Provider:   Donato Schultz, MD     Please follow up with Dr Para March regarding referral to Allergist to evaluate rash.

## 2023-01-23 NOTE — Progress Notes (Signed)
Cardiology Office Note:  .   Date:  01/23/2023  ID:  Luke [name], DOB April 11, 1946, MRN [id number] PCP: [name], MD  [facility] Providers Cardiologist:  Donato Schultz, MD Electrophysiologist:  Sherryl Manges, MD    History of Present Illness: .   Luke [name] is a 77 y.o. male here for follow-up coronary artery disease, ischemic cardiomyopathy, ICD, Dr. Graciela Husbands, stage IIIa kidney disease.  Cardiac catheterization June 2024 resulted in CTO PCI to RCA.  Unfortunately he seems to have developed a rash again in his antecubital fossa.  Previously he was on Plavix, we stopped that because we thought that was the rash, we switched to Brilinta then he could not breathe, he is now on Effient but his rashes come back. He is on furosemide but he has been on this without a rash.   The patient's shortness of breath is particularly concerning. He reports that his oxygen levels drop to 84-86% when he walks around, but return to 93-95% when he sits down. He recently had a CT scan to investigate this issue, but the results have not yet been read.  Had CT scan of his lungs yesterday.  Awaiting read.  Interstitial lung disease was noted on prior scan in 2023.  Studies Reviewed: .        Cardiac catheterization 07/17/2021 right coronary artery distal total occlusion likely resulting in symptoms.  LAD stenosis is not significantly changed.  Medication management.  Consider orbital atherectomy to calcified LAD if symptoms do not resolve.  He did end up having CTO PCI of the RCA on 10/10/2022 with 3 separate stents. Diagnostic Dominance: Right  Echocardiogram 07/17/2021-EF 40 to 45% Risk Assessment/Calculations:           Physical Exam:   VS:  BP (!) 164/78   Pulse 77   Ht 6' (1.829 m)   Wt 175 lb (79.4 kg)   SpO2 95%   BMI 23.73 kg/m    Wt Readings from Last 3 Encounters:  01/23/23 175 lb (79.4 kg)  01/22/23 176 lb 12.8 oz (80.2 kg)  01/09/23 176 lb (79.8 kg)    GEN: Well  nourished, well developed in no acute distress NECK: No JVD; No carotid bruits CARDIAC: RRR, no murmurs, rubs, gallops RESPIRATORY:  Clear to auscultation without rales, wheezing or rhonchi  ABDOMEN: Soft, non-tender, non-distended EXTREMITIES:  No edema; No deformity, rash especially left antecubital fossa  ASSESSMENT AND PLAN: .    Coronary Artery Disease History of multiple vessel angioplasty with stent placement. Recent rash and hives with Plavix, Praluent, and now possibly Effient or Lasix.  Would promote that he continue Effient and aspirin. -Could consider stopping Lasix to assess if rash resolves. -Consider allergist referral for further evaluation of rash.  Idiopathic Pulmonary Fibrosis (IPF) Shortness of breath, especially on exertion. Oxygen saturation drops with activity but recovers with rest. Recent CT scan pending radiologist review. -Continue current management. -Discuss CT scan results with pulmonologist --Severe diffusion defect on PFTs  Edema Recent improvement in leg edema. Rash developing again, possibly related to Lasix??  But he has been on Lasix in the past without any difficulty. -Could consider discontinueing Lasix to assess if rash resolves.  Not sure of the reason. -Continue monitoring edema status.  He has desire to come off of some medications.  We will go ahead and stop his isosorbide 30 mg, stop amlodipine 5 mg.  We will increase the valsartan or Diovan to 40 mg once a day.  Follow-up -Contact primary care physician, Dr. Para March, for allergist referral.  Dispo: 1 yr  Signed, Donato Schultz, MD

## 2023-01-24 ENCOUNTER — Other Ambulatory Visit: Payer: Medicare Other

## 2023-01-24 ENCOUNTER — Other Ambulatory Visit: Payer: Self-pay

## 2023-01-24 DIAGNOSIS — I739 Peripheral vascular disease, unspecified: Secondary | ICD-10-CM

## 2023-01-27 ENCOUNTER — Telehealth: Payer: Self-pay

## 2023-01-27 ENCOUNTER — Ambulatory Visit: Payer: Self-pay

## 2023-01-27 NOTE — Patient Outreach (Signed)
Care Coordination   Follow Up Visit Note   01/27/2023 Name: Luke [name] MRN: [id number] DOB: 1946-04-16  Luke [name] is a 77 y.o. year old male who sees Luke Nam, MD for primary care. I spoke with  Luke [name] by phone today.  What matters to the patients health and wellness today?  Patient reports having rash and swelling on right foot.  Patient states cause of rash and swelling may be due to his lasix therefore not taking currently.  Patient states provider is aware.   He reports being off of lasix x 5 day and now experiencing increase SOB and LE swelling.   He reports today's weight was 175 lbs and reports having 3 lb weight gain in a week.  Patient states he will call his cardiologist  office/ nurse today to report symptoms.     Goals Addressed             This Visit's Progress    Continued improvement post surgery and management of health conditions       Interventions Today    Flowsheet Row Most Recent Value  Chronic Disease   Chronic disease during today's visit Congestive Heart Failure (CHF)  General Interventions   General Interventions Discussed/Reviewed General Interventions Reviewed, Doctor Visits  [evaluation of current treatment plan for HF and patients adhrence to plan as establishe by provider. Assesse or HF symptoms.]  Doctor Visits Discussed/Reviewed Doctor Visits Reviewed  Luke [name] to keep follow up appointments with providers.]  Education Interventions   Education Provided Provided Education  [Reviewed heart failure symptoms and action plan. Discussed patient's current increase in heart failure symptoms/ yellow zone.  Confirmed with patient his need to call cardiology office today.]  Provided Verbal Education On Other  [Advised ongoing weight monitoring, right leg wrapping and elevation.]  Nutrition Interventions   Nutrition Discussed/Reviewed Decreasing salt  Pharmacy Interventions   Pharmacy Dicussed/Reviewed Pharmacy Topics Reviewed   Luke [name] to take medications as prescribed.]              SDOH assessments and interventions completed:  No     Care Coordination Interventions:  Yes, provided   Follow up plan: Follow up call scheduled for 02/10/23    Encounter Outcome:  Patient Visit Completed   Luke Ina RN,BSN,CCM [facility] Care Coordination [phone] direct line

## 2023-01-27 NOTE — Patient Instructions (Signed)
Visit Information  Thank you for taking time to visit with me today. Please don't hesitate to contact me if I can be of assistance to you.   Following are the goals we discussed today:   Goals Addressed             This Visit's Progress    Continued improvement post surgery and management of health conditions       Interventions Today    Flowsheet Row Most Recent Value  Chronic Disease   Chronic disease during today's visit Congestive Heart Failure (CHF)  General Interventions   General Interventions Discussed/Reviewed General Interventions Reviewed, Doctor Visits  [evaluation of current treatment plan for HF and patients adhrence to plan as establishe by provider. Assesse or HF symptoms.]  Doctor Visits Discussed/Reviewed Doctor Visits Reviewed  [name] to keep follow up appointments with providers.]  Education Interventions   Education Provided Provided Education  [Reviewed heart failure symptoms and action plan. Discussed patient's current increase in heart failure symptoms/ yellow zone.  Confirmed with patient his need to call cardiology office today.]  Provided Verbal Education On Other  [Advised ongoing weight monitoring, right leg wrapping and elevation.]  Nutrition Interventions   Nutrition Discussed/Reviewed Decreasing salt  Pharmacy Interventions   Pharmacy Dicussed/Reviewed Pharmacy Topics Reviewed  [name] to take medications as prescribed.]              Our next appointment is by telephone on 02/10/23 at 2 pm  Please call the care guide team at [phone] if you need to cancel or reschedule your appointment.   If you are experiencing a Mental Health or Behavioral Health Crisis or need someone to talk to, please call the Suicide and Crisis Lifeline: 988 call 1-800-273-TALK (toll free, 24 hour hotline)  Patient verbalizes understanding of instructions and care plan provided today and agrees to view in MyChart. Active MyChart status and patient understanding  of how to access instructions and care plan via MyChart confirmed with patient.     George Ina RN,BSN,CCM [facility] Care Coordination [phone] direct line

## 2023-01-27 NOTE — Telephone Encounter (Signed)
Patient called and stated Lasix was stopped at 9/18 Dr Judd Gaudier OV due to patient thought the Lasix may be causing his rash.     He reports the rash is better since stopping lasix 5 days ago.    Since stopping Lasix, he reports being very SOB and gained 4 pounds I the last 4 days.  Right leg is a little swollen.  Unable to get manual remote transmissioin to review fluid levels due to [employer] app is not working.  Provide tech support number.  Pt is asking if there is another diuretic that can be prescribed since the Lasix may be causing the rash.     Sent to Dr Anne Fu for review and recommendation due to symptoms and patient asking if another diuretic can be tried.

## 2023-01-28 DIAGNOSIS — E113591 Type 2 diabetes mellitus with proliferative diabetic retinopathy without macular edema, right eye: Secondary | ICD-10-CM | POA: Diagnosis not present

## 2023-01-28 MED ORDER — TORSEMIDE 20 MG PO TABS: 20.0000 mg | ORAL_TABLET | Freq: Every day | ORAL | 2 refills | Status: DC

## 2023-01-28 NOTE — Telephone Encounter (Signed)
  Received: Today Jake Bathe, MD  Earle Troiano, Josephine Igo, RN Caller: Unspecified (Yesterday,  4:30 PM) Happy that the rash is now gone after stopping the Lasix.  Lets try torsemide 20 mg once a day. Donato Schultz, MD

## 2023-01-28 NOTE — Telephone Encounter (Signed)
Spoke with patient.  Advised Dr Anne Fu ordered Torsemide 20 mg 1 tablet daily to help with fluid.  Confirmed preferred pharmacy at [facility] and he would like 90 day supply.  Advised to monitor rash after starting Torsemide.  If rash worsens, stop Torsemide and call Dr Anne Fu.  Consulted with [employer] Pharmacist regarding taking Torsemide and Furosemide allergy.

## 2023-01-28 NOTE — Telephone Encounter (Signed)
Sent to [name] triage for follow with Dr Anne Fu.

## 2023-01-28 NOTE — Progress Notes (Signed)
Remote ICD transmission.   

## 2023-01-31 DIAGNOSIS — Z961 Presence of intraocular lens: Secondary | ICD-10-CM | POA: Diagnosis not present

## 2023-01-31 DIAGNOSIS — H18413 Arcus senilis, bilateral: Secondary | ICD-10-CM | POA: Diagnosis not present

## 2023-01-31 DIAGNOSIS — H35433 Paving stone degeneration of retina, bilateral: Secondary | ICD-10-CM | POA: Diagnosis not present

## 2023-01-31 DIAGNOSIS — E113593 Type 2 diabetes mellitus with proliferative diabetic retinopathy without macular edema, bilateral: Secondary | ICD-10-CM | POA: Diagnosis not present

## 2023-01-31 DIAGNOSIS — H2511 Age-related nuclear cataract, right eye: Secondary | ICD-10-CM | POA: Diagnosis not present

## 2023-01-31 DIAGNOSIS — H35373 Puckering of macula, bilateral: Secondary | ICD-10-CM | POA: Diagnosis not present

## 2023-02-03 ENCOUNTER — Other Ambulatory Visit: Payer: Self-pay | Admitting: Vascular Surgery

## 2023-02-05 ENCOUNTER — Encounter: Payer: Self-pay | Admitting: Internal Medicine

## 2023-02-05 ENCOUNTER — Encounter (HOSPITAL_BASED_OUTPATIENT_CLINIC_OR_DEPARTMENT_OTHER): Payer: Self-pay

## 2023-02-05 DIAGNOSIS — I5042 Chronic combined systolic (congestive) and diastolic (congestive) heart failure: Secondary | ICD-10-CM

## 2023-02-05 NOTE — Progress Notes (Signed)
Remote ICD transmission.   

## 2023-02-10 ENCOUNTER — Ambulatory Visit: Payer: Medicare Other | Attending: Internal Medicine

## 2023-02-10 ENCOUNTER — Ambulatory Visit: Payer: Self-pay

## 2023-02-10 DIAGNOSIS — Z95810 Presence of automatic (implantable) cardiac defibrillator: Secondary | ICD-10-CM | POA: Diagnosis not present

## 2023-02-10 DIAGNOSIS — I5042 Chronic combined systolic (congestive) and diastolic (congestive) heart failure: Secondary | ICD-10-CM | POA: Diagnosis not present

## 2023-02-10 NOTE — Patient Instructions (Signed)
Visit Information  Thank you for taking time to visit with me today. Please don't hesitate to contact me if I can be of assistance to you.   Following are the goals we discussed today:   Goals Addressed             This Visit's Progress    Continued improvement post surgery and management of health conditions       Interventions Today    Flowsheet Row Most Recent Value  Chronic Disease   Chronic disease during today's visit Congestive Heart Failure (CHF)  General Interventions   General Interventions Discussed/Reviewed General Interventions Reviewed, Doctor Visits  [evaluation of current treatment plan for heart failure and patients adherence to plan as establsihed by provider. Assessed for HF symptoms and increase SOB. Assessed for ongoing rash and edema to right leg.]  Doctor Visits Discussed/Reviewed Doctor Visits Reviewed  Education Interventions   Education Provided Provided Education  [Advised to conserve energy as needed and to use oxygen as recommended by provider.  Advised to continue to monitor his oxygen level reporting frequent O2 saturations readings below  90.  Advised to call 911 for severe breathing issues and / symptoms.]  Provided Verbal Education On Other  [reviewed heart failure symptoms/ action plan.  Discussed with patient benefits of going to heart failure clinic.]  Pharmacy Interventions   Pharmacy Dicussed/Reviewed Pharmacy Topics Reviewed              Our next appointment is by telephone on 03/17/23 at 2 pm  Please call the care guide team at [phone] if you need to cancel or reschedule your appointment.   If you are experiencing a Mental Health or Behavioral Health Crisis or need someone to talk to, please call the Suicide and Crisis Lifeline: 988 call 1-800-273-TALK (toll free, 24 hour hotline)  Patient verbalizes understanding of instructions and care plan provided today and agrees to view in MyChart. Active MyChart status and patient  understanding of how to access instructions and care plan via MyChart confirmed with patient.     George Ina RN,BSN,CCM [facility] Care Coordination [phone] direct line

## 2023-02-10 NOTE — Patient Outreach (Signed)
Care Coordination   Follow Up Visit Note   02/10/2023 Name: Luke [name] MRN: [id number] DOB: [birth year]  Luke [name] is a 77 y.o. year old male who sees [name], MD for primary care. I spoke with  Luke [name] by phone today.  What matters to the patients health and wellness today?  Patient states he was not able to discuss the CT scan at his pulmonology appointment on 01/23/23 due to results not being in.  Patient states results of CT scan are now available which he has reviewed.  He reports contacting his cardiology office regarding the results and he will be referred to the heart failure clinic.  Patient states he continues to have ongoing SOB and uses his oxygen at 2 L when/ if needed.  He reports his oxygen readings range from 92-95.   Patient states he continues to apply silvadene and wrap his right leg. He reports edema to right leg has gone completely down.  Patient states he still has some swelling and weeping in right foot.  Denies any signs of infection.  Patient states the rash he had all over his body has resolved.    Goals Addressed             This Visit's Progress    Continued improvement post surgery and management of health conditions       Interventions Today    Flowsheet Row Most Recent Value  Chronic Disease   Chronic disease during today's visit Congestive Heart Failure (CHF)  General Interventions   General Interventions Discussed/Reviewed General Interventions Reviewed, Doctor Visits  [evaluation of current treatment plan for heart failure and patients adherence to plan as establsihed by provider. Assessed for HF symptoms and increase SOB. Assessed for ongoing rash and edema to right leg.]  Doctor Visits Discussed/Reviewed Doctor Visits Reviewed  Education Interventions   Education Provided Provided Education  [Advised to conserve energy as needed and to use oxygen as recommended by provider.  Advised to continue to monitor his oxygen level  reporting frequent O2 saturations readings below  90.  Advised to call 911 for severe breathing issues and / symptoms.]  Provided Verbal Education On Other  [reviewed heart failure symptoms/ action plan.  Discussed with patient benefits of going to heart failure clinic.]  Pharmacy Interventions   Pharmacy Dicussed/Reviewed Pharmacy Topics Reviewed              SDOH assessments and interventions completed:  No     Care Coordination Interventions:  Yes, provided   Follow up plan: Follow up call scheduled for 03/17/23    Encounter Outcome:  Patient Visit Completed   George Ina RN,BSN,CCM [facility] Care Coordination [phone] direct line

## 2023-02-11 ENCOUNTER — Other Ambulatory Visit: Payer: Self-pay | Admitting: Cardiology

## 2023-02-13 ENCOUNTER — Telehealth: Payer: Self-pay | Admitting: Internal Medicine

## 2023-02-13 NOTE — Telephone Encounter (Signed)
PT called to make FU appt. His last visit was a MYCHART so no FU appt was made at that time. AVS States:  Re IPF   -Concerned you are getting worse because of worsening shortness of breath, pulmonary function test and new exercise hypoxemia.  Alternatively could be heart failure but current CT chest suggest fibrosis  is getting worse (official report pending)  I had to sched out to 2025. Does Dr. Elvera Lennox want to have him seen sooner? These AVS notes concerned me.   Also, he wants Dr. Elvera Lennox to rev the last imaging. TY

## 2023-02-14 NOTE — Progress Notes (Signed)
EPIC Encounter for ICM Monitoring  Patient Name: Luke [name] is a 77 y.o. male Date: 02/14/2023 Primary Care Physican: [name], MD Primary Cardiologist: Anne Fu Electrophysiologist: Joycelyn Schmid Pacing: >99%          11/12/2022 Weight: 170 lbs (baseline 174 lbs) 01/01/2023 Weight: 170.4 01/02/2023 Weight: 172.6 lbs                                                            Spoke with patient and heart failure questions reviewed.  Transmission results reviewed.  Pt reports SOB his improved.  He has not had any rash with Torsemide.  Does have weeping from leg and has treatment for it.     CorVue thoracic impedance suggesting normal fluid levels with the exception of possible fluid accumulation from 9/20-9/25.   Prescribed:  Torsemide 20 mg take 1 tablet(s) (20 mg total) by mouth daily.   Labs: 01/09/2023 Creatinine 1.24, BUN 28, Potassium 4.6, Sodium 135, GFR 56.58 11/20/2022 Creatinine 1.17, BUN 23, Potassium 3.9, Sodium 135, GFR >60 11/08/2022 Creatinine 1.40, BUN 31, Potassium 5.0, Sodium 137  11/01/2022 Creatinine 1.16, BUN 23, Potassium 3.8, Sodium 139, GFR >60  10/31/2022 Creatinine 1.33, BUN 23, Potassium 3.3, Sodium 137, GFR 54  10/30/2022 Creatinine 1.50, BUN 24, Potassium 4.4, Sodium 41, GFR >60 A complete set of results can be found in Results Review.   Recommendations:  No changes and encouraged to call if experiencing any fluid symptoms.   Follow-up plan: ICM clinic phone appointment on 03/17/2023.   91 day device clinic remote transmission 04/23/2023.     EP/Cardiology Office Visits:  Recall 12/12/2023 with Dr. Graciela Husbands  Recall 01/18/2024 with Dr Anne Fu.     Copy of ICM check sent to Dr. Graciela Husbands.   3 month ICM trend: 02/10/2023.    12-14 Month ICM trend:     Karie Soda, RN 02/14/2023 1:30 PM

## 2023-02-14 NOTE — Telephone Encounter (Signed)
When I did the video visit 01/23/2023 I wanted him back in 6 weeks. In any event please let him know that on the CT scan his pulmonary fibrosis is stable compared to 2023 but only worsens 2022   So right now this is a positive news particularly on the CT scan.  Based on this I can see him in 8 weeks from now with a spirometry and DLCO but he wants to come a little bit sooner it can be 6 weeks from now      Latest Ref Rng & Units 01/09/2023   11:01 AM 07/29/2022   10:16 AM 04/04/2022    1:20 PM 01/10/2022    1:42 PM 10/02/2021    8:45 AM 05/10/2021   11:45 AM  PFT Results  FVC-Pre L 3.76  3.83  3.79  3.73  4.09  3.63   FVC-Predicted Pre % 82  83  82  81  88  78   Pre FEV1/FVC % % 66  67  68  69  68  70   FEV1-Pre L 2.48  2.56  2.57  2.56  2.77  2.55   FEV1-Predicted Pre % 75  77  77  77  82  75   DLCO uncorrected ml/min/mmHg 6.86  11.69  9.71  11.84  13.10  11.58   DLCO UNC% % 25  43  36  44  48  43   DLCO corrected ml/min/mmHg  11.69  9.71  11.84  13.45  11.36   DLCO COR %Predicted %  43  36  44  50  42   DLVA Predicted % 35  56  47  67  62  56   TLC L 6.05        TLC % Predicted % 81        RV % Predicted % 84

## 2023-02-17 DIAGNOSIS — H25811 Combined forms of age-related cataract, right eye: Secondary | ICD-10-CM | POA: Diagnosis not present

## 2023-02-17 DIAGNOSIS — H25041 Posterior subcapsular polar age-related cataract, right eye: Secondary | ICD-10-CM | POA: Diagnosis not present

## 2023-02-17 DIAGNOSIS — H25011 Cortical age-related cataract, right eye: Secondary | ICD-10-CM | POA: Diagnosis not present

## 2023-02-17 DIAGNOSIS — H2511 Age-related nuclear cataract, right eye: Secondary | ICD-10-CM | POA: Diagnosis not present

## 2023-02-19 NOTE — Telephone Encounter (Signed)
I spoke with the pt and notified of results/recs per MR  Pt verbalized understanding  I have scheduled him for spiro/dlco 04/25/23 and he is set to see MR 05/13/23

## 2023-02-26 NOTE — Telephone Encounter (Signed)
Please Inform Patient That we had checked an echo 6/24 with normal LV function Thanks

## 2023-02-28 ENCOUNTER — Other Ambulatory Visit: Payer: Self-pay | Admitting: Cardiology

## 2023-03-03 ENCOUNTER — Other Ambulatory Visit: Payer: Self-pay | Admitting: Family Medicine

## 2023-03-03 ENCOUNTER — Other Ambulatory Visit: Payer: Self-pay | Admitting: Physician Assistant

## 2023-03-03 NOTE — Telephone Encounter (Signed)
Sent.  Please see about setting up a DM f/u OV this fall, when possible.  Thanks.

## 2023-03-03 NOTE — Telephone Encounter (Signed)
Patient has had a lot of changes medically just want to make sure ok to refill.

## 2023-03-04 NOTE — Telephone Encounter (Signed)
Noted. Thanks.

## 2023-03-04 NOTE — Telephone Encounter (Signed)
Patient has been advised that rx has been sent and scheduled to be seen on 11/11

## 2023-03-11 ENCOUNTER — Telehealth: Payer: Self-pay

## 2023-03-11 NOTE — Telephone Encounter (Signed)
Spoke with patient and advised to increase Torsemide to 20 mg to twice a day for 3 days only and then return to 1 tablet daily.  Advised to use ER if breathing gets worse.  He agreed with plan.

## 2023-03-11 NOTE — Telephone Encounter (Signed)
Received call from patient.  He sent a remote transmission to be reviewed because he feels like he has fluid.    Pt reporting significant shortness of breath and weight gain of 1-2 lbs in the last 2 days.  Weight today is 175 lbs.    He currently takes Torsemide 20 mg by mouth daily.  Advised will send to Dr Anne Fu for review and recommendations.   Advised to use ER if breathing difficulty gets worse.    03/11/2023 CorVue thoracic impedance suggesting possible fluid accumulation starting 11/3.

## 2023-03-11 NOTE — Telephone Encounter (Signed)
   Jake Bathe, MD  You; Sharin Grave, RN    Increase torsemide to 20mg  BID for 3 days

## 2023-03-13 ENCOUNTER — Telehealth: Payer: Self-pay

## 2023-03-13 NOTE — Telephone Encounter (Signed)
Received medshipment of lantus solostar 100 units/3 boxes. Placed meds in 2nd fridge top shelf. Spoke with patient he is aware of shipment and will pick up at his appointment on Monday.

## 2023-03-17 ENCOUNTER — Ambulatory Visit: Payer: Self-pay

## 2023-03-17 ENCOUNTER — Encounter: Payer: Self-pay | Admitting: Family Medicine

## 2023-03-17 ENCOUNTER — Ambulatory Visit: Payer: Medicare Other | Attending: Internal Medicine

## 2023-03-17 ENCOUNTER — Ambulatory Visit (INDEPENDENT_AMBULATORY_CARE_PROVIDER_SITE_OTHER): Payer: Medicare Other | Admitting: Family Medicine

## 2023-03-17 VITALS — BP 124/62 | HR 70 | Temp 97.6°F | Ht 72.0 in | Wt 177.6 lb

## 2023-03-17 DIAGNOSIS — E785 Hyperlipidemia, unspecified: Secondary | ICD-10-CM | POA: Diagnosis not present

## 2023-03-17 DIAGNOSIS — E1159 Type 2 diabetes mellitus with other circulatory complications: Secondary | ICD-10-CM | POA: Diagnosis not present

## 2023-03-17 DIAGNOSIS — E1169 Type 2 diabetes mellitus with other specified complication: Secondary | ICD-10-CM

## 2023-03-17 DIAGNOSIS — Z95810 Presence of automatic (implantable) cardiac defibrillator: Secondary | ICD-10-CM

## 2023-03-17 DIAGNOSIS — I5042 Chronic combined systolic (congestive) and diastolic (congestive) heart failure: Secondary | ICD-10-CM

## 2023-03-17 LAB — POCT GLYCOSYLATED HEMOGLOBIN (HGB A1C): Hemoglobin A1C: 7.9 % — AB (ref 4.0–5.6)

## 2023-03-17 MED ORDER — CIPROFLOXACIN HCL 500 MG PO TABS: 500.0000 mg | ORAL_TABLET | Freq: Two times a day (BID) | ORAL | 0 refills | Status: DC

## 2023-03-17 NOTE — Patient Outreach (Signed)
  Care Coordination   Follow Up Visit Note   03/17/2023 Name: Luke [name] MRN: [id number] DOB: 04-26-1946  Luke [name] is a 77 y.o. year old male who sees [name], MD for primary care. I spoke with  Luke [name] by phone today.  What matters to the patients health and wellness today?  Patient reports having follow up visit with primary care provider this morning for ongoing right leg swelling/ weeping.  Patient states provider prescribed Cipro which he will pick up from pharmacy this afternoon.  Patient states he continues to have worsening SOB.  He states this has been going on for approximately 1 1/2 week. He states provider is aware and he is scheduled to go to the heart failure clinic on tomorrow.  Patient reports wearing his Oxygen more often at 2 L with O2 stats ranging from 93-98%.     Goals Addressed             This Visit's Progress    Continued improvement post surgery and management of health conditions       Interventions Today    Flowsheet Row Most Recent Value  Chronic Disease   Chronic disease during today's visit Congestive Heart Failure (CHF), Other  [right leg/ chin redness/ weeping]  General Interventions   General Interventions Discussed/Reviewed General Interventions Reviewed, Doctor Visits  [evaluation of current treatment plan for HF/right leg redness/ weeping and patients adherence to plan as established by provider. Assessed for HF symptoms and  ongoing right leg symtoms.]  Doctor Visits Discussed/Reviewed Doctor Visits Reviewed  Education Interventions   Education Provided Provided Education  [Advised patient to continue to monitor right leg and notify provider if no improvement. Reviewed HF symptoms and advised to notify provider if symptoms worsen.  Call 911 for severe symptoms.]  Provided Verbal Education On Other  [Confirmed patient continues to use O2 as recommended and monitors O2 saturations. Assessed for O2 sat.]  Pharmacy  Interventions   Pharmacy Dicussed/Reviewed Pharmacy Topics Reviewed  [name] to take Cipro until completed.  Advised patient to closely monitor blood sugar due to possible hypoglycemic events with Cipro.  Advised to keep glucose tablets/ hard candy with him at all times to treat hypogycemic events.]              SDOH assessments and interventions completed:  No     Care Coordination Interventions:  Yes, provided   Follow up plan: Follow up call scheduled for 03/31/23    Encounter Outcome:  Patient Visit Completed   George Ina RN,BSN,CCM [facility]  [facility], [facility] coordinator / Case Manager Phone: [phone]

## 2023-03-17 NOTE — Patient Instructions (Signed)
Visit Information  Thank you for taking time to visit with me today. Please don't hesitate to contact me if I can be of assistance to you.   Following are the goals we discussed today:   Goals Addressed             This Visit's Progress    Continued improvement post surgery and management of health conditions       Interventions Today    Flowsheet Row Most Recent Value  Chronic Disease   Chronic disease during today's visit Congestive Heart Failure (CHF), Other  [right leg/ chin redness/ weeping]  General Interventions   General Interventions Discussed/Reviewed General Interventions Reviewed, Doctor Visits  [evaluation of current treatment plan for HF/right leg redness/ weeping and patients adherence to plan as established by provider. Assessed for HF symptoms and  ongoing right leg symtoms.]  Doctor Visits Discussed/Reviewed Doctor Visits Reviewed  Education Interventions   Education Provided Provided Education  [Advised patient to continue to monitor right leg and notify provider if no improvement. Reviewed HF symptoms and advised to notify provider if symptoms worsen.  Call 911 for severe symptoms.]  Provided Verbal Education On Other  [Confirmed patient continues to use O2 as recommended and monitors O2 saturations. Assessed for O2 sat.]  Pharmacy Interventions   Pharmacy Dicussed/Reviewed Pharmacy Topics Reviewed  [name] to take Cipro until completed.  Advised patient to closely monitor blood sugar due to possible hypoglycemic events with Cipro.  Advised to keep glucose tablets/ hard candy with him at all times to treat hypogycemic events.]              Our next appointment is by telephone on 03/31/23 at 2:30 PM  Please call the care guide team at [phone] if you need to cancel or reschedule your appointment.   If you are experiencing a Mental Health or Behavioral Health Crisis or need someone to talk to, please call the Suicide and Crisis Lifeline: 988 call  1-800-273-TALK (toll free, 24 hour hotline)  Patient verbalizes understanding of instructions and care plan provided today and agrees to view in MyChart. Active MyChart status and patient understanding of how to access instructions and care plan via MyChart confirmed with patient.     George Ina RN,BSN,CCM [place]  [facility], [facility] coordinator / Case Manager Phone: [phone]

## 2023-03-17 NOTE — Progress Notes (Unsigned)
Diabetes:  Using medications without difficulties:yes Hypoglycemic episodes: no Hyperglycemic episodes: no Feet problems: see below.  R foot is clearly red and puffy compared to L.  He had improvement with cipro prev. His leg pain is clearly better from prior Blood Sugars averaging: usually 200 or lower after eating.  Usually ~100 or lower in the AM.   A1c 7.9.  d/w pt at OV. He was able to pick up his lantus supply at the OV.   He has pacer in place.  He recently took higher dose of torsemide due to SOB per cards recommendation- but had a rash in the meantime.    Discussed prev imaging.  IMPRESSION: 1. 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 Pulmonary parenchymal pattern of fibrosis, as detailed above, stable from 03/27/2022 but progressive from 10/03/2020.  Defer flu shot today given his leg findings.    Meds, vitals, and allergies reviewed.   ROS: Per HPI unless specifically indicated in ROS section   GEN: nad, alert and oriented HEENT: ncat NECK: supple w/o LA CV: rrr. PULM: ctab- I don't hear crackles.   ABD: soft, +bs EXT: no edema SKIN: blanching macular rash on the B forearms, flexor side.  R shin and foot reddish with weeping, dec in light touch sensation.  Dec in DP pulse B.  No foot ulceration.

## 2023-03-17 NOTE — Patient Instructions (Addendum)
Restart cipro but caution re: low sugars while on cipro with glipizide.  Keep the appointment with cardiology tomorrow.  Take care.  Glad to see you. Update me in about 3-4 days about your leg.

## 2023-03-17 NOTE — Telephone Encounter (Signed)
Patient was dispensed medication(Lantus) at his office visit today.

## 2023-03-18 ENCOUNTER — Encounter (HOSPITAL_COMMUNITY): Payer: Self-pay | Admitting: Cardiology

## 2023-03-18 ENCOUNTER — Ambulatory Visit (HOSPITAL_COMMUNITY)
Admission: RE | Admit: 2023-03-18 | Discharge: 2023-03-18 | Disposition: A | Discharge status: Home / Self Care | Payer: Medicare Other | Source: Ambulatory Visit | Attending: Cardiology | Admitting: Cardiology

## 2023-03-18 VITALS — BP 110/70 | HR 95 | Wt 180.2 lb

## 2023-03-18 DIAGNOSIS — I2510 Atherosclerotic heart disease of native coronary artery without angina pectoris: Secondary | ICD-10-CM | POA: Diagnosis not present

## 2023-03-18 DIAGNOSIS — I5042 Chronic combined systolic (congestive) and diastolic (congestive) heart failure: Secondary | ICD-10-CM

## 2023-03-18 DIAGNOSIS — I272 Pulmonary hypertension, unspecified: Secondary | ICD-10-CM | POA: Diagnosis not present

## 2023-03-18 DIAGNOSIS — R9431 Abnormal electrocardiogram [ECG] [EKG]: Secondary | ICD-10-CM | POA: Insufficient documentation

## 2023-03-18 DIAGNOSIS — I130 Hypertensive heart and chronic kidney disease with heart failure and stage 1 through stage 4 chronic kidney disease, or unspecified chronic kidney disease: Secondary | ICD-10-CM | POA: Insufficient documentation

## 2023-03-18 DIAGNOSIS — J9611 Chronic respiratory failure with hypoxia: Secondary | ICD-10-CM | POA: Diagnosis not present

## 2023-03-18 DIAGNOSIS — Z955 Presence of coronary angioplasty implant and graft: Secondary | ICD-10-CM | POA: Insufficient documentation

## 2023-03-18 DIAGNOSIS — I5022 Chronic systolic (congestive) heart failure: Secondary | ICD-10-CM | POA: Diagnosis not present

## 2023-03-18 DIAGNOSIS — T537X1S Toxic effect of other halogen derivatives of aromatic hydrocarbons, accidental (unintentional), sequela: Secondary | ICD-10-CM | POA: Diagnosis not present

## 2023-03-18 DIAGNOSIS — Z9189 Other specified personal risk factors, not elsewhere classified: Secondary | ICD-10-CM | POA: Diagnosis not present

## 2023-03-18 DIAGNOSIS — J84112 Idiopathic pulmonary fibrosis: Secondary | ICD-10-CM | POA: Insufficient documentation

## 2023-03-18 DIAGNOSIS — N1831 Chronic kidney disease, stage 3a: Secondary | ICD-10-CM | POA: Insufficient documentation

## 2023-03-18 DIAGNOSIS — I255 Ischemic cardiomyopathy: Secondary | ICD-10-CM | POA: Insufficient documentation

## 2023-03-18 NOTE — Patient Instructions (Addendum)
No Labs done today.   No medication changes were made. Please continue all current medications as prescribed.  You have been referred to an Allergist. They will contact you to schedule an appointment.   Your physician recommends that you schedule a follow-up appointment in: as needed  If you have any questions or concerns before your next appointment please send [place] a message through [place] or call our office at [phone].    TO LEAVE A MESSAGE FOR THE NURSE SELECT OPTION 2, PLEASE LEAVE A MESSAGE INCLUDING: YOUR NAME DATE OF BIRTH CALL BACK NUMBER REASON FOR CALL**this is important as we prioritize the call backs  YOU WILL RECEIVE A CALL BACK THE SAME DAY AS LONG AS YOU CALL BEFORE 4:00 PM   Do the following things EVERYDAY: Weigh yourself in the morning before breakfast. Write it down and keep it in a log. Take your medicines as prescribed Eat low salt foods--Limit salt (sodium) to 2000 mg per day.  Stay as active as you can everyday Limit all fluids for the day to less than 2 liters   At the [facility], you and your health needs are our priority. As part of our continuing mission to provide you with exceptional heart care, we have created designated Provider Care Teams. These Care Teams include your primary Cardiologist (physician) and Advanced Practice Providers (APPs- Physician Assistants and Nurse Practitioners) who all work together to provide you with the care you need, when you need it.   You may see any of the following providers on your designated Care Team at your next follow up: Dr Arvilla Meres Dr Marca Ancona Dr. Marcos Eke, NP Robbie Lis, [place] [facility] [place], [place] Brynda Peon, NP Karle Plumber, PharmD   Please be sure to bring in all your medications bottles to every appointment.    Thank you for choosing  [facility]

## 2023-03-18 NOTE — Progress Notes (Signed)
ReDS Vest / Clip - 03/18/23 1500       ReDS Vest / Clip   Station Marker D    Ruler Value 27    ReDS Value Range Low volume    ReDS Actual Value 27

## 2023-03-18 NOTE — Progress Notes (Signed)
ADVANCED HEART FAILURE CLINIC NOTE  Referring Physician: [name], MD  Primary Care: [name], MD Primary Cardiologist: Dr. Anne Fu  HPI: Luke [name] is a 77 y.o. male with CAD s/p CTO PCI to the RCA in 6/24, PCI to the LAD, HFrEF 2/2 ischemic cardiomyopathy, Stage IIIa CKD presenting today to establish care.   According to Luke [name] he has severely reduced functional status secondary to chronic hypoxic respiratory failure. Several decades ago, he was exposed to Agent orange while serving in [place]. He believes that has led to significant decline in his respiratory function. He efollows with Dr. Marchelle Gearing for pulmonary fibrosis; currently on Esbriet.  His cardiac history dates back to 2009 when he was diagnosed with systolic heart failure likely secondary to ischemic cardiomyopathy.  He most recently had a heart catheterization in June 2024 and is now status post CTO PCI to the RCA.  His care afterwards was complicated by diffuse rash after starting Plavix.  He was subsequently switched to Brilinta which she could not tolerate due to shortness of breath and is now on Effient.  From a functional standpoint, his limitations are likely secondary to severe underlying pulmonary fibrosis.  From a cardiac standpoint he is overall fairly well compensated on exam.  He presents today to see if there are any further adjustments that can be made for medications and for possible evaluation of underlying pulmonary hypertension secondary to pulmonary fibrosis.  Past Medical History:  Diagnosis Date   AAA (abdominal aortic aneurysm) (HCC) 2011   Per vascular surgery   Atrial fibrillation (HCC)    CAD (coronary artery disease)    Presumed CAD with nuclear scan October 09, 2011,  large anteroseptal MI and inferior MI. Catheterization scheduled October 15, 2011   Cardiomyopathy [facility])    Nuclear, October 09, 2011, EF 30%, multiple focal wall motion abnormalities   Chronic kidney disease    CKD3    COVID-19    2021   Diabetes mellitus    type II   GERD (gastroesophageal reflux disease)    Silent   HLD (hyperlipidemia)    Hypertension    white coat HTN-- often elevated in office and controlled on outside checks.   ICD (implantable cardioverter-defibrillator) in place    CRT-D placed March, 2014 complete heart block and k dysfunction   IPF (idiopathic pulmonary fibrosis) (HCC)    LBBB (left bundle branch block)    LBBB on EKG October 11, 2011,  no prior EKG has been done   Low testosterone    Hx of   On home oxygen therapy    2L as needed   Pacemaker    PAD (peripheral artery disease) (HCC)    Pancreatitis    Umbilical hernia     Current Outpatient Medications  Medication Sig Dispense Refill   albuterol (VENTOLIN HFA) 108 (90 Base) MCG/ACT inhaler Inhale 2 puffs into the lungs every 6 (six) hours as needed for wheezing or shortness of breath. 8 g 6   aspirin EC 81 MG tablet Take 1 tablet (81 mg total) by mouth daily. 120 tablet 2   carvedilol (COREG) 25 MG tablet TAKE 1 TABLET BY MOUTH TWICE DAILY WITH MEALS 180 tablet 3   Cholecalciferol (VITAMIN D3) 50 MCG (2000 UT) TABS Take 2,000 Units by mouth in the morning.     ciprofloxacin (CIPRO) 500 MG tablet Take 1 tablet (500 mg total) by mouth 2 (two) times daily for 10 days. 20 tablet 0  Cyanocobalamin (VITAMIN B-12 PO) Take 2,000 mcg by mouth in the morning.     glipiZIDE (GLUCOTROL) 5 MG tablet TAKE 1 TABLET(5 MG) BY MOUTH TWICE DAILY BEFORE A MEAL 180 tablet 1   glucose blood (ACCU-CHEK AVIVA PLUS) test strip AS DIRECTED TO CHECK BLOOD SUGAR TWICE DAILY 200 strip 3   insulin glargine (LANTUS SOLOSTAR) 100 UNIT/ML Solostar Pen INJECT 0.3 TO 0.35 MLS(30 TO 35 UNITS) INTO THE SKIN EVERY DAY 15 mL 3   metFORMIN (GLUCOPHAGE) 500 MG tablet TAKE 2 TABLETS BY MOUTH TWICE DAILY WITH FOOD 360 tablet 3   Multiple Vitamin (MULTIVITAMIN WITH MINERALS) TABS tablet Take 1 tablet by mouth in the morning.     omeprazole (PRILOSEC) 40 MG capsule  Take 40 mg by mouth daily.     prasugrel (EFFIENT) 10 MG TABS tablet Take 1 tablet (10 mg total) by mouth daily. 90 tablet 1   simvastatin (ZOCOR) 20 MG tablet Take 1 tablet (20 mg total) by mouth at bedtime. 90 tablet 3   SSD 1 % cream APPLY TOPICALLY TO THE AFFECTED AREA DAILY 50 g 1   torsemide (DEMADEX) 20 MG tablet Take 1 tablet (20 mg total) by mouth daily. 90 tablet 2   umeclidinium bromide (INCRUSE ELLIPTA) 62.5 MCG/ACT AEPB Inhale 1 puff into the lungs daily. 90 each 3   valsartan (DIOVAN) 40 MG tablet Take 1 tablet (40 mg total) by mouth daily with supper. 90 tablet 1   No current facility-administered medications for this encounter.    Allergies  Allergen Reactions   Aldactone [Spironolactone] Other (See Comments)    Hyperkalemia   Brilinta [Ticagrelor] Shortness Of Breath    Numbness in hands and feet Blurry vision   Clopidogrel Rash    Redness and Itchiness   Codeine Rash and Hives   Amoxicillin Hives   Atorvastatin     Myalgias with lipitor.  Does tolerate simvastatin.     Benadryl [Diphenhydramine] Hives   Januvia [Sitagliptin] Other (See Comments)    Diarrhea and heart racing   Jardiance [Empagliflozin] Other (See Comments)    Polyuria; excessive weight loss   Lisinopril     Possible cause of pancreatitis   Rosuvastatin Other (See Comments)    myalgia   Lasix [Furosemide] Rash      Social History   Socioeconomic History   Marital status: Married    Spouse name: Patti   Number of children: 1   Years of education: Not on file   Highest education level: Associate degree: occupational, Scientist, product/process development, or vocational program  Occupational History   Occupation: retired  Tobacco Use   Smoking status: Former    Current packs/day: 0.00    Average packs/day: 2.0 packs/day for 42.0 years (84.0 ttl pk-yrs)    Types: Cigarettes    Start date: 1964    Quit date: 05/06/2004    Years since quitting: 18.8    Passive exposure: Never   Smokeless tobacco: Never  Vaping  Use   Vaping status: Never Used  Substance and Sexual Activity   Alcohol use: Not Currently   Drug use: No   Sexual activity: Yes    Partners: Female  Other Topics Concern   Not on file  Social History Narrative   [place] vet, he has known service related agent orange exposure.    Retired   Community education officer daily.     Social Determinants of Health   Financial Resource Strain: Low Risk  (03/16/2023)   Overall Financial Resource Strain (CARDIA)  Difficulty of Paying Living Expenses: Not hard at all  Food Insecurity: No Food Insecurity (03/16/2023)   Hunger Vital Sign    Worried About Running Out of Food in the Last Year: Never true    Ran Out of Food in the Last Year: Never true  Transportation Needs: No Transportation Needs (03/16/2023)   PRAPARE - Administrator, Civil Service (Medical): No    Lack of Transportation (Non-Medical): No  Physical Activity: Sufficiently Active (03/16/2023)   Exercise Vital Sign    Days of Exercise per Week: 5 days    Minutes of Exercise per Session: 30 min  Stress: No Stress Concern Present (03/16/2023)   [employer] of Occupational Health - Occupational Stress Questionnaire    Feeling of Stress : Not at all  Social Connections: Unknown (03/16/2023)   Social Connection and Isolation Panel [NHANES]    Frequency of Communication with Friends and Family: Twice a week    Frequency of Social Gatherings with Friends and Family: Once a week    Attends Religious Services: Patient declined    Database administrator or Organizations: Yes    Attends Engineer, structural: More than 4 times per year    Marital Status: Married  Catering manager Violence: Not At Risk (10/30/2022)   Humiliation, Afraid, Rape, and Kick questionnaire    Fear of Current or Ex-Partner: No    Emotionally Abused: No    Physically Abused: No    Sexually Abused: No      Family History  Problem Relation Age of Onset   Hypertension Mother    Stroke Mother     Hyperlipidemia Mother    Lung cancer Father    Diabetes Sister    Heart disease Sister        Before age 83   Hypertension Sister    Hyperlipidemia Sister    Heart attack Sister    Hypertension Son    Colon cancer Neg Hx    Prostate cancer Neg Hx    Esophageal cancer Neg Hx    Stomach cancer Neg Hx    Rectal cancer Neg Hx    Colon polyps Neg Hx    Pancreatic cancer Neg Hx     PHYSICAL EXAM: Vitals:   03/18/23 1505 03/18/23 1511  BP: 110/70   Pulse: 95   SpO2: 91% 93%   GENERAL: Well nourished, well developed, and in no apparent distress at rest.  HEENT: Negative for arcus senilis or xanthelasma. There is no scleral icterus.  The mucous membranes are pink and moist.   NECK: Supple, No masses. Normal carotid upstrokes without bruits. No masses or thyromegaly.    CHEST: There are no chest wall deformities. There is no chest wall tenderness. Respirations are unlabored.  Lungs- coarse lung sounds with fine diffuse inspiratory and expiratory crackles CARDIAC:  JVP: 6 cm H2O         Normal S1, S2  Normal rate with regular rhythm. No murmurs, rubs or gallops.  Pulses are 2+ and symmetrical in upper and lower extremities. No edema.  ABDOMEN: Soft, non-tender, non-distended. There are no masses or hepatomegaly. There are normal bowel sounds.  EXTREMITIES: Warm and well perfused with no cyanosis, clubbing.  LYMPHATIC: No axillary or supraclavicular lymphadenopathy.  NEUROLOGIC: Patient is oriented x3 with no focal or lateralizing neurologic deficits.  PSYCH: Patients affect is appropriate, there is no evidence of anxiety or depression.  SKIN: Warm and dry; no lesions or wounds.   DATA  REVIEW  ECG: 03/18/23: AsVp as per my personal interpretation  ECHO: 10/30/22: LVEF 60%-65%, normal RV function with RVSP  CATH: 10/10/22: Successful CTO PCI of the RCA with stents placed in the distal, mid and proximal RCA. The distal and mid stents overlap.  09/18/22:    Mid RCA to Dist  RCA lesion is 50% stenosed.   Prox RCA lesion is 75% stenosed.   Mid RCA lesion is 25% stenosed.   Dist RCA lesion is 99% stenosed.   Prox LAD to Mid LAD lesion is 80% stenosed.   After IVL with 3.5 x 12mm Shockwave,  drug-eluting stent was successfully placed using a SYNERGY XD [id number], postdilated to > 3.75 mm and optimized with IVUS.   Post intervention, there is a 0% residual stenosis.   The left ventricular systolic function is normal.   LV end diastolic pressure is normal.   The left ventricular ejection fraction is 55-65% by visual estimate.   There is no aortic valve stenosis.   ASSESSMENT & PLAN:  Evaluation for [facility] - Review of TTE from 10/30/22 w/ LVEF of 65% with RVSP of .  - Based on TTE, he does not appear to have underlying PH, however, given severity of pulmonary fibrosis and dyspnea reasonable to proceed with RHC.  - After extensive discussion with Mr. Gililland and reviewing his echo with him he wishes to discussing RHC with Dr. Marchelle Gearing. I reached out to Dr. Marchelle Gearing today; he is agreeable.   2. Idiopathic pulmonary fibrosis w/ chronic pulmonary fibrosis - Followed by Dr. Marchelle Gearing - Currently on esbriet   3. CAD - s/p CTO distal RCA of 10/10/22 - No chest pain; stable from CAD standpoint.   4. Tobacco use - Smoked up to 30 cigarettes daily for ?decades.   I spent 45 minutes caring for this patient today including face to face time, ordering and reviewing labs, extensive discussion regarding PH, risks/benefits of RHC, seeing the patient, documenting in the record, and arranging follow ups.   Alexei Doswell Advanced Heart Failure Mechanical Circulatory Support

## 2023-03-18 NOTE — Progress Notes (Signed)
EPIC Encounter for ICM Monitoring  Patient Name: Luke [name] is a 77 y.o. male Date: 03/18/2023 Primary Care Physican: [name], MD Primary Cardiologist: Skains/Sabharwal Electrophysiologist: Joycelyn Schmid Pacing: >99%          11/12/2022 Weight: 170 lbs (baseline 174 lbs) 01/01/2023 Weight: 170.4 01/02/2023 Weight: 172.6 lbs 03/11/2023 Weight: 175 lbs 03/17/2023 Weight: 173.8 lbs 03/18/2023 Weight: 176.2 lbs                                                            Spoke with patient and heart failure questions reviewed.  Transmission results reviewed.  Pt reports SOB and weight decreased after taking Torsemide twice a day but noticed SOB and weight gain returned over the weekend.  He also noticed the rash returned (same as he had when taking Furosemide) returned when taking Torsemide twice a day.   He's taking Cipro for inflamed foot that is prescribed by PCP.     CorVue thoracic impedance suggesting fluid levels returned to normal 11/6 after increasing Torsemide 20 mg bid per Dr Anne Fu recommendation (see ICM 11/5 phone note).  Possible fluid accumulation returned 11/8.   Prescribed:  Torsemide 20 mg take 1 tablet(s) (20 mg total) by mouth daily.   Labs: 01/09/2023 Creatinine 1.24, BUN 28, Potassium 4.6, Sodium 135, GFR 56.58 11/20/2022 Creatinine 1.17, BUN 23, Potassium 3.9, Sodium 135, GFR >60 11/08/2022 Creatinine 1.40, BUN 31, Potassium 5.0, Sodium 137  11/01/2022 Creatinine 1.16, BUN 23, Potassium 3.8, Sodium 139, GFR >60  10/31/2022 Creatinine 1.33, BUN 23, Potassium 3.3, Sodium 137, GFR 54  10/30/2022 Creatinine 1.50, BUN 24, Potassium 4.4, Sodium 41, GFR >60 A complete set of results can be found in Results Review.   Recommendations:  Advised to provide Dr Gasper Lloyd information regarding his rash that may be related to diuretics and explained he will provide any recommendations if needed regarding the fluid.     Follow-up plan: ICM clinic phone appointment on  03/24/2023 to recheck fluid levels.   91 day device clinic remote transmission 04/23/2023.     EP/Cardiology Office Visits: 03/18/2023 with Dr Gasper Lloyd (1st HF clinic visit).  Recall 12/12/2023 with Dr. Graciela Husbands  Recall 01/18/2024 with Dr Anne Fu.     Copy of ICM check sent to Dr. Graciela Husbands.   3 month ICM trend: 03/17/2023.    12-14 Month ICM trend:     Karie Soda, RN 03/18/2023 9:25 AM

## 2023-03-19 NOTE — Assessment & Plan Note (Addendum)
Discussed options.  His sugar is reasonable for now.  The main issue appears to be his right leg, with concern for cellulitis.  He clearly improved on Cipro previously.  Restart that in the meantime with routine cautions discussed with patient.  He recently took a higher dose of torsemide to help with shortness of breath but he still has asymmetric edema in the right greater than left leg.  He also has a rash on his bilateral forearms that he attributed to the higher dose of torsemide.  He does not have lip swelling or wheeze.  I suspect that his previous shortness of breath is multifactorial with heart failure/interstitial lung disease.  Discussed.  I think the best option at this point is to restart Cipro and then follow-up with cardiology regarding his diuretics and heart failure.  I asked him to update me about his leg in a few days, sooner if needed.  He agrees to plan.  30 minutes were devoted to patient care in this encounter (this includes time spent reviewing the patient's file/history, interviewing and examining the patient, counseling/reviewing plan with patient).

## 2023-03-24 ENCOUNTER — Ambulatory Visit: Payer: Medicare Other | Attending: Internal Medicine

## 2023-03-24 ENCOUNTER — Other Ambulatory Visit: Payer: Self-pay | Admitting: Vascular Surgery

## 2023-03-24 DIAGNOSIS — I5042 Chronic combined systolic (congestive) and diastolic (congestive) heart failure: Secondary | ICD-10-CM

## 2023-03-24 DIAGNOSIS — Z95810 Presence of automatic (implantable) cardiac defibrillator: Secondary | ICD-10-CM

## 2023-03-24 NOTE — Progress Notes (Unsigned)
EPIC Encounter for ICM Monitoring  Patient Name: Luke [name] is a 77 y.o. male Date: 03/24/2023 Primary Care Physican: [name], MD Primary Cardiologist: Skains/Sabharwal Electrophysiologist: Joycelyn Schmid Pacing: >99%          11/12/2022 Weight: 170 lbs (baseline 174 lbs) 01/01/2023 Weight: 170.4 01/02/2023 Weight: 172.6 lbs 03/11/2023 Weight: 175 lbs 03/17/2023 Weight: 173.8 lbs 03/18/2023 Weight: 176.2 lbs 03/24/2023 Weight: 173.6 lbs                                                            Spoke with patient and heart failure questions reviewed.  Transmission results reviewed.  Pt continues to have SOB but no worse than usual.  11/12 HF clinic showed ReDS Vest 27 which is within normal limits.  s.     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      CorVue thoracic impedance suggesting possible fluid accumulation continues which does not correlate with ReDS vest result of 27 at 11/12 HF clinic OV.   Prescribed:  Torsemide 20 mg take 1 tablet(s) (20 mg total) by mouth daily.   Labs: 01/09/2023 Creatinine 1.24, BUN 28, Potassium 4.6, Sodium 135, GFR 56.58 11/20/2022 Creatinine 1.17, BUN 23, Potassium 3.9, Sodium 135, GFR >60 11/08/2022 Creatinine 1.40, BUN 31, Potassium 5.0, Sodium 137  11/01/2022 Creatinine 1.16, BUN 23, Potassium 3.8, Sodium 139, GFR >60  10/31/2022 Creatinine 1.33, BUN 23, Potassium 3.3, Sodium 137, GFR 54  10/30/2022 Creatinine 1.50, BUN 24, Potassium 4.4, Sodium 41, GFR >60 A complete set of results can be found in Results Review.   Recommendations:  Advised to continue with current Torsemide dosage and will call back if Dr Gasper Lloyd makes any recommendations.     Follow-up plan: ICM clinic phone appointment on 04/14/2023 to recheck fluid levels.   91 day device clinic remote transmission 04/23/2023.     EP/Cardiology Office Visits:  Recall 12/12/2023 with Dr. Graciela Husbands  Recall 01/18/2024 with Dr Anne Fu.     Copy  of ICM check sent to Dr. Graciela Husbands.   Copy sent to Dr Gasper Lloyd for review and recommendations if needed.    3 month ICM trend: 03/24/2023.    12-14 Month ICM trend:     Karie Soda, RN 03/24/2023 8:38 AM

## 2023-03-26 DIAGNOSIS — Z23 Encounter for immunization: Secondary | ICD-10-CM | POA: Diagnosis not present

## 2023-03-29 ENCOUNTER — Other Ambulatory Visit: Payer: Self-pay | Admitting: Internal Medicine

## 2023-03-31 ENCOUNTER — Ambulatory Visit: Payer: Self-pay

## 2023-03-31 NOTE — Patient Outreach (Signed)
  Care Coordination   Follow Up Visit Note   03/31/2023 Name: Luke [name] MRN: [id number] DOB: [birth year]  Luke [name] is a 77 y.o. year old male who sees [name], MD for primary care. I spoke with  Luke [name] by phone today.  What matters to the patients health and wellness today?  Patient reports having follow up visit with cardiologist at heart failure clinic. Patient states he was told his heart is working fine and he doesn't have heart failure.  Patient states he was advised that he could follow up as needed.  Patient states he continues to wrap is right leg/foot area due to the edema and weeping. He states he has run out of the cream he was using to put on his leg so he will send a message to his provider requesting a refill. Patient states his breathing is about the same. He states he was outside raking leaves today  and he continues to get SOB and has to take frequent rest periods.   Patient states he is scheduled for a breathing test on 05/13/23.  He states he continues to wear his O2 at 2 L.  Patient reports his weight is down to 170.6 lbs from 176 last week. Patient states he is scheduled to see an allergist due to the rash he has on his legs.     Goals Addressed             This Visit's Progress    Continued improvement post surgery and management of health conditions       Interventions Today    Flowsheet Row Most Recent Value  Chronic Disease   Chronic disease during today's visit Other, Diabetes  [pulmonary fibrosis, right leg/ chin edema/ weeping.]  General Interventions   General Interventions Discussed/Reviewed General Interventions Reviewed, Doctor Visits, Labs  [evaluation of current treatment plan for mentioned health conditions and patients adherence to plan as established by provider. Assessed for increase breathing symptoms.]  Labs Hgb A1c every 6 months  [discussed most recent Hgb A1c of 7.9 and goal]  Doctor Visits Discussed/Reviewed Doctor  Visits Reviewed  Annabell Sabal upcoming provider visits. Advised to keep follow up appointments with providers.]  Education Interventions   Education Provided Provided Education  [Advised to wear oxygen as instructed.  Advised to take frequent rest breaks when doing more energy exerting activities. Advised to  weight daily and record, eat low salt diet and limit fluid intake to < 2L. Discussed recent heart failure clinic visit.]  Nutrition Interventions   Nutrition Discussed/Reviewed Nutrition Reviewed, Decreasing salt  Pharmacy Interventions   Pharmacy Dicussed/Reviewed Pharmacy Topics Reviewed  [name] to take medications as prescribed.  Discused recent medication adjustments.]              SDOH assessments and interventions completed:  No     Care Coordination Interventions:  Yes, provided   Follow up plan: Follow up call scheduled for 04/25/23    Encounter Outcome:  Patient Visit Completed   George Ina RN,BSN,CCM [facility]  Value[facility]Based [facility], [facility] coordinator / Case Manager Phone: [phone]

## 2023-03-31 NOTE — Patient Instructions (Signed)
Visit Information  Thank you for taking time to visit with me today. Please don't hesitate to contact me if I can be of assistance to you.   Following are the goals we discussed today:   Goals Addressed             This Visit's Progress    Continued improvement post surgery and management of health conditions       Interventions Today    Flowsheet Row Most Recent Value  Chronic Disease   Chronic disease during today's visit Other, Diabetes  [pulmonary fibrosis, right leg/ chin edema/ weeping.]  General Interventions   General Interventions Discussed/Reviewed General Interventions Reviewed, Doctor Visits, Labs  [evaluation of current treatment plan for mentioned health conditions and patients adherence to plan as established by provider. Assessed for increase breathing symptoms.]  Labs Hgb A1c every 6 months  [discussed most recent Hgb A1c of 7.9 and goal]  Doctor Visits Discussed/Reviewed Doctor Visits Reviewed  Annabell Sabal upcoming provider visits. Advised to keep follow up appointments with providers.]  Education Interventions   Education Provided Provided Education  [Advised to wear oxygen as instructed.  Advised to take frequent rest breaks when doing more energy exerting activities. Advised to  weight daily and record, eat low salt diet and limit fluid intake to < 2L. Discussed recent heart failure clinic visit.]  Nutrition Interventions   Nutrition Discussed/Reviewed Nutrition Reviewed, Decreasing salt  Pharmacy Interventions   Pharmacy Dicussed/Reviewed Pharmacy Topics Reviewed  [name] to take medications as prescribed.  Discused recent medication adjustments.]              Our next appointment is by telephone on 04/25/23 at 11 am  Please call the care guide team at [phone] if you need to cancel or reschedule your appointment.   If you are experiencing a Mental Health or Behavioral Health Crisis or need someone to talk to, please call the Suicide and Crisis  Lifeline: 988 call 1-800-273-TALK (toll free, 24 hour hotline)  Patient verbalizes understanding of instructions and care plan provided today and agrees to view in MyChart. Active MyChart status and patient understanding of how to access instructions and care plan via MyChart confirmed with patient.     George Ina RN,BSN,CCM [place]  Value[facility], [facility] coordinator / Case Manager Phone: [phone]

## 2023-04-07 ENCOUNTER — Ambulatory Visit: Payer: Medicare Other | Attending: Internal Medicine

## 2023-04-07 ENCOUNTER — Telehealth: Payer: Self-pay

## 2023-04-07 DIAGNOSIS — I5042 Chronic combined systolic (congestive) and diastolic (congestive) heart failure: Secondary | ICD-10-CM

## 2023-04-07 DIAGNOSIS — Z95810 Presence of automatic (implantable) cardiac defibrillator: Secondary | ICD-10-CM

## 2023-04-07 NOTE — Telephone Encounter (Signed)
Remote ICM transmission received.  Attempted call to patient regarding ICM remote transmission and left detailed message per DPR.  Left ICM phone number and advised to return call for any fluid symptoms or questions. Next ICM remote transmission scheduled 04/15/2023.

## 2023-04-07 NOTE — Progress Notes (Signed)
EPIC Encounter for ICM Monitoring  Patient Name: Luke [name] is a 77 y.o. male Date: 04/07/2023 Primary Care Physican: [name], MD Primary Cardiologist: Skains/Sabharwal Electrophysiologist: Joycelyn Schmid Pacing: >99%          11/12/2022 Weight: 170 lbs (baseline 174 lbs) 01/01/2023 Weight: 170.4 01/02/2023 Weight: 172.6 lbs 03/11/2023 Weight: 175 lbs 03/17/2023 Weight: 173.8 lbs 03/18/2023 Weight: 176.2 lbs 03/24/2023 Weight: 173.6 lbs                                                            Attempted call to patient and unable to reach.  Left detailed message per DPR regarding transmission. Transmission reviewed.    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      CorVue thoracic impedance suggesting possible fluid accumulation starting 11/26 with exception of 2 days at baseline.   Prescribed:  Torsemide 20 mg take 1 tablet(s) (20 mg total) by mouth daily.   Labs: 01/09/2023 Creatinine 1.24, BUN 28, Potassium 4.6, Sodium 135, GFR 56.58 11/20/2022 Creatinine 1.17, BUN 23, Potassium 3.9, Sodium 135, GFR >60 11/08/2022 Creatinine 1.40, BUN 31, Potassium 5.0, Sodium 137  11/01/2022 Creatinine 1.16, BUN 23, Potassium 3.8, Sodium 139, GFR >60  10/31/2022 Creatinine 1.33, BUN 23, Potassium 3.3, Sodium 137, GFR 54  10/30/2022 Creatinine 1.50, BUN 24, Potassium 4.4, Sodium 41, GFR >60 A complete set of results can be found in Results Review.   Recommendations:  Left voice mail with ICM number and encouraged to call if experiencing any fluid symptoms.     Follow-up plan: ICM clinic phone appointment on 04/15/2023 to recheck fluid levels.   91 day device clinic remote transmission 04/23/2023.     EP/Cardiology Office Visits:  Recall 12/12/2023 with Dr. Graciela Husbands  Recall 01/18/2024 with Dr Anne Fu.     Copy of ICM check sent to Dr. Graciela Husbands.   Will send copy to Dr Gasper Lloyd for review and recommendations if patient is reached.    3 month ICM trend:  04/07/2023.    12-14 Month ICM trend:     Karie Soda, RN 04/07/2023 1:24 PM

## 2023-04-09 ENCOUNTER — Ambulatory Visit: Payer: Medicare Other | Admitting: Primary Care

## 2023-04-15 ENCOUNTER — Ambulatory Visit: Payer: Medicare Other | Attending: Internal Medicine

## 2023-04-15 ENCOUNTER — Other Ambulatory Visit: Payer: Self-pay | Admitting: Family Medicine

## 2023-04-15 DIAGNOSIS — I5042 Chronic combined systolic (congestive) and diastolic (congestive) heart failure: Secondary | ICD-10-CM

## 2023-04-15 DIAGNOSIS — Z95810 Presence of automatic (implantable) cardiac defibrillator: Secondary | ICD-10-CM

## 2023-04-15 NOTE — Progress Notes (Signed)
EPIC Encounter for ICM Monitoring  Patient Name: Luke [name] is a 77 y.o. male Date: 04/15/2023 Primary Care Physican: [name], MD Primary Cardiologist: Skains/Sabharwal Electrophysiologist: Joycelyn Schmid Pacing: >99%          11/12/2022 Weight: 170 lbs (baseline 174 lbs) 01/01/2023 Weight: 170.4 01/02/2023 Weight: 172.6 lbs 03/11/2023 Weight: 175 lbs 03/17/2023 Weight: 173.8 lbs 03/18/2023 Weight: 176.2 lbs 03/24/2023 Weight: 173.6 lbs 04/15/2023 Weight: 174.2 lbs                                                            Spoke with patient and heart failure questions reviewed.  Transmission results reviewed.  Pt reports weight increased about 3 lbs within the last 3 days (highest weight 175 lbs).  He reports eating foods high in salt such as ham over the last couple of days.      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      CorVue thoracic impedance suggesting fluid levels returned to normal on 12/4 followed by the return of possible fluid accumulation starting 12/7.   Prescribed:  Torsemide 20 mg take 1 tablet(s) (20 mg total) by mouth daily.   Labs: 01/09/2023 Creatinine 1.24, BUN 28, Potassium 4.6, Sodium 135, GFR 56.58 11/20/2022 Creatinine 1.17, BUN 23, Potassium 3.9, Sodium 135, GFR >60 11/08/2022 Creatinine 1.40, BUN 31, Potassium 5.0, Sodium 137  11/01/2022 Creatinine 1.16, BUN 23, Potassium 3.8, Sodium 139, GFR >60  10/31/2022 Creatinine 1.33, BUN 23, Potassium 3.3, Sodium 137, GFR 54  10/30/2022 Creatinine 1.50, BUN 24, Potassium 4.4, Sodium 41, GFR >60 A complete set of results can be found in Results Review.   Recommendations:  Pt asked if he could take extra Torsemide for a few days and advised Dr Gasper Lloyd will need to make the recommendation if warranted.   Copy sent to Dr Gasper Lloyd for review and recommendations if needed.    Follow-up plan: ICM clinic phone appointment on 04/28/2023 to recheck fluid levels.    91 day device clinic remote transmission 04/23/2023.     EP/Cardiology Office Visits:  Recall 12/12/2023 with Dr. Graciela Husbands  Recall 01/18/2024 with Dr Anne Fu.     Copy of ICM check sent to Dr. Graciela Husbands.     3 month ICM trend: 04/15/2023.    12-14 Month ICM trend:     Karie Soda, RN 04/15/2023 10:52 AM

## 2023-04-15 NOTE — Progress Notes (Signed)
Spoke with patient and advised of Dr Nyoka Cowden recommendation to take Torsemide 2 tablets x 2 days only.  After 2nd day return to 1 tablet daily.  He verbalized understanding and agreed with plan.

## 2023-04-15 NOTE — Progress Notes (Signed)
Luke Nettles, DO  Evo Aderman, Josephine Igo, RN Hey,  I would double torsemide for two days; he generally diureses fairly quickly. I suspect his weight will be back at baseline with that.  Thank you, Adi

## 2023-04-15 NOTE — Progress Notes (Signed)
Attempted call to patient and unable to reach.  Left message for return call.

## 2023-04-21 ENCOUNTER — Encounter: Payer: Self-pay | Admitting: Podiatry

## 2023-04-21 ENCOUNTER — Ambulatory Visit (INDEPENDENT_AMBULATORY_CARE_PROVIDER_SITE_OTHER): Payer: Medicare Other | Admitting: Podiatry

## 2023-04-21 DIAGNOSIS — L03115 Cellulitis of right lower limb: Secondary | ICD-10-CM

## 2023-04-21 DIAGNOSIS — B351 Tinea unguium: Secondary | ICD-10-CM

## 2023-04-21 DIAGNOSIS — M79675 Pain in left toe(s): Secondary | ICD-10-CM

## 2023-04-21 DIAGNOSIS — I739 Peripheral vascular disease, unspecified: Secondary | ICD-10-CM | POA: Diagnosis not present

## 2023-04-21 DIAGNOSIS — M79674 Pain in right toe(s): Secondary | ICD-10-CM

## 2023-04-21 NOTE — Progress Notes (Signed)
Chief Complaint  Patient presents with   Diabetes    Patient states he is here to get his toe nails cut , patient states he feet have been pretty bad since last visit     Subjective:  77 y.o. male with PMHx of diabetes mellitus presenting today for recurrence of the ulcer to the plantar aspect of the first MTP of the right foot.  Patient states that he has been dealing with a callus/ulcer to the area since 2018.  Patient states that today he is doing very well.  Since last visit he has had cardiac stents and lower extremity angiogram procedures performed.  He says that being off of his feet is helped his wounds significantly.  Patient also requesting nail debridement today.  He says that his nails are long and tender especially in his close toed shoes.  He is unable to trim his own toenails.   Past Medical History:  Diagnosis Date   AAA (abdominal aortic aneurysm) (HCC) 2011   Per vascular surgery   Atrial fibrillation (HCC)    CAD (coronary artery disease)    Presumed CAD with nuclear scan October 09, 2011,  large anteroseptal MI and inferior MI. Catheterization scheduled October 15, 2011   Cardiomyopathy [facility])    Nuclear, October 09, 2011, EF 30%, multiple focal wall motion abnormalities   Chronic kidney disease    CKD3   COVID-19    2021   Diabetes mellitus    type II   GERD (gastroesophageal reflux disease)    Silent   HLD (hyperlipidemia)    Hypertension    white coat HTN-- often elevated in office and controlled on outside checks.   ICD (implantable cardioverter-defibrillator) in place    CRT-D placed March, 2014 complete heart block and k dysfunction   IPF (idiopathic pulmonary fibrosis) (HCC)    LBBB (left bundle branch block)    LBBB on EKG October 11, 2011,  no prior EKG has been done   Low testosterone    Hx of   On home oxygen therapy    2L as needed   Pacemaker    PAD (peripheral artery disease) (HCC)    Pancreatitis    Umbilical hernia     Past Surgical History:   Procedure Laterality Date   ABDOMINAL AORTAGRAM N/A 10/28/2011   Procedure: ABDOMINAL Ronny Flurry;  Surgeon: [name], MD;  Location: [facility] CATH LAB;  Service: Cardiovascular;  Laterality: N/A;   ABDOMINAL AORTIC ANEURYSM REPAIR     EVAR    ABDOMINAL AORTOGRAM N/A 05/27/2022   Procedure: ABDOMINAL AORTOGRAM;  Surgeon: Maeola Harman, MD;  Location[facility] INVASIVE CV LAB;  Service: Cardiovascular;  Laterality: N/A;   BI-VENTRICULAR IMPLANTABLE CARDIOVERTER DEFIBRILLATOR N/A 07/16/2012   Procedure: BI-VENTRICULAR IMPLANTABLE CARDIOVERTER DEFIBRILLATOR  (CRT-D);  Surgeon: Duke Salvia, MD;  Location: [facility] CATH LAB;  Service: Cardiovascular;  Laterality: N/A;   BIV ICD GENERATOR CHANGEOUT N/A 10/25/2020   Procedure: BIV ICD GENERATOR CHANGEOUT;  Surgeon: Duke Salvia, MD;  Location: [facility] INVASIVE CV LAB;  Service: Cardiovascular;  Laterality: N/A;   CARDIAC CATHETERIZATION     CATARACT EXTRACTION Left 2023   COLONOSCOPY  03/23/2018; 2020   CORONARY CTO INTERVENTION N/A 10/10/2022   Procedure: CORONARY CTO INTERVENTION;  Surgeon: Corky Crafts, MD;  Location: [facility] INVASIVE CV LAB;  Service: Cardiovascular;  Laterality: N/A;   CORONARY LITHOTRIPSY N/A 09/18/2022   Procedure: CORONARY LITHOTRIPSY;  Surgeon: Corky Crafts, MD;  Location: [facility] INVASIVE CV LAB;  Service: Cardiovascular;  Laterality: N/A;   CORONARY STENT INTERVENTION N/A 09/18/2022   Procedure: CORONARY STENT INTERVENTION;  Surgeon: Corky Crafts, MD;  Location: [facility] INVASIVE CV LAB;  Service: Cardiovascular;  Laterality: N/A;   CORONARY ULTRASOUND/IVUS N/A 09/18/2022   Procedure: Coronary Ultrasound/IVUS;  Surgeon: Corky Crafts, MD;  Location: [facility] INVASIVE CV LAB;  Service: Cardiovascular;  Laterality: N/A;   ENDARTERECTOMY FEMORAL Right 11/19/2022   Procedure: RIGHT COMMON FEMORAL ENDARTERECTOMY WITH VEIN PATCH ANGIOPLASTY;  Surgeon: Maeola Harman, MD;  Location: [facility] OR;  Service:  Vascular;  Laterality: Right;   ESOPHAGOGASTRODUODENOSCOPY N/A 02/12/2019   Procedure: ESOPHAGOGASTRODUODENOSCOPY (EGD);  Surgeon: Lynann Bologna, MD;  Location: [facility] ENDOSCOPY;  Service: Endoscopy;  Laterality: N/A;   ESOPHAGOGASTRODUODENOSCOPY (EGD) WITH PROPOFOL N/A 02/05/2018   Procedure: ESOPHAGOGASTRODUODENOSCOPY (EGD) WITH PROPOFOL;  Surgeon: Napoleon Form, MD;  Location: [facility] ENDOSCOPY;  Service: Endoscopy;  Laterality: N/A;   ESOPHAGOGASTRODUODENOSCOPY (EGD) WITH PROPOFOL N/A 09/27/2018   Procedure: ESOPHAGOGASTRODUODENOSCOPY (EGD) WITH PROPOFOL;  Surgeon: Jeani Hawking, MD;  Location: [facility] ENDOSCOPY;  Service: Endoscopy;  Laterality: N/A;   FOREIGN BODY REMOVAL  09/27/2018   Procedure: FOREIGN BODY REMOVAL;  Surgeon: Jeani Hawking, MD;  Location: [facility] ENDOSCOPY;  Service: Endoscopy;;   FOREIGN BODY REMOVAL  02/12/2019   Procedure: FOREIGN BODY REMOVAL;  Surgeon: Lynann Bologna, MD;  Location: [facility] ENDOSCOPY;  Service: Endoscopy;;   HERNIA REPAIR  05/14/2013   INSERTION OF MESH N/A 05/14/2013   Procedure: INSERTION OF MESH;  Surgeon: Ardeth Sportsman, MD;  Location: [facility] OR;  Service: General;  Laterality: N/A;   LEFT HEART CATH N/A 10/10/2022   Procedure: Left Heart Cath;  Surgeon: Corky Crafts, MD;  Location: [facility] INVASIVE CV LAB;  Service: Cardiovascular;  Laterality: N/A;   LEFT HEART CATH AND CORONARY ANGIOGRAPHY N/A 07/17/2021   Procedure: LEFT HEART CATH AND CORONARY ANGIOGRAPHY;  Surgeon: Lennette Bihari, MD;  Location: [facility] INVASIVE CV LAB;  Service: Cardiovascular;  Laterality: N/A;   LEFT HEART CATH AND CORONARY ANGIOGRAPHY N/A 09/18/2022   Procedure: LEFT HEART CATH AND CORONARY ANGIOGRAPHY;  Surgeon: Corky Crafts, MD;  Location: [facility] INVASIVE CV LAB;  Service: Cardiovascular;  Laterality: N/A;   LOWER EXTREMITY ANGIOGRAM Bilateral 10/28/2011   Procedure: LOWER EXTREMITY ANGIOGRAM;  Surgeon: [name], MD;  Location: [facility] CATH LAB;  Service: Cardiovascular;   Laterality: Bilateral;   LOWER EXTREMITY ANGIOGRAM Right 11/19/2022   Procedure: RIGHT LOWER EXTREMITY ANGIOGRAM;  Surgeon: Maeola Harman, MD;  Location: [facility] OR;  Service: Vascular;  Laterality: Right;   LOWER EXTREMITY ANGIOGRAPHY  05/27/2022   Procedure: Lower Extremity Angiography;  Surgeon: Maeola Harman, MD;  Location: [facility] INVASIVE CV LAB;  Service: Cardiovascular;;   PACEMAKER INSERTION  07/16/2012   pacemaker/defibrilator   PATCH ANGIOPLASTY Right 11/19/2022   Procedure: VEIN PATCH ANGIOPLASTY;  Surgeon: Maeola Harman, MD;  Location: [facility] OR;  Service: Vascular;  Laterality: Right;   POLYPECTOMY     POSTERIOR CERVICAL FUSION/FORAMINOTOMY N/A 06/09/2014   Procedure: Laminectomy - Cervical two-Cervcial four posterior cervical instrumented fusion Cervical two-cervical four;  Surgeon: Tia Alert, MD;  Location: [facility] NEURO ORS;  Service: Neurosurgery;  Laterality: N/A;  posterior    SAVORY DILATION N/A 02/05/2018   Procedure: SAVORY DILATION;  Surgeon: Napoleon Form, MD;  Location: [facility] ENDOSCOPY;  Service: Endoscopy;  Laterality: N/A;   TONSILLECTOMY     as a child    UMBILICAL HERNIA REPAIR N/A 05/14/2013   Procedure: LAPAROSCOPIC exploration and repair  of hernia in abdominal ;  Surgeon: Ardeth Sportsman, MD;  Location: [facility] OR;  Service: General;  Laterality: N/A;   UPPER GASTROINTESTINAL ENDOSCOPY  2020    Allergies  Allergen Reactions   Aldactone [Spironolactone] Other (See Comments)    Hyperkalemia   Brilinta [Ticagrelor] Shortness Of Breath    Numbness in hands and feet Blurry vision   Clopidogrel Rash    Redness and Itchiness   Codeine Rash and Hives   Amoxicillin Hives   Atorvastatin     Myalgias with lipitor.  Does tolerate simvastatin.     Benadryl [Diphenhydramine] Hives   Januvia [Sitagliptin] Other (See Comments)    Diarrhea and heart racing   Jardiance [Empagliflozin] Other (See Comments)    Polyuria; excessive weight loss    Lisinopril     Possible cause of pancreatitis   Rosuvastatin Other (See Comments)    myalgia   Lasix [Furosemide] Rash     B/L legs 04/21/2023  Objective/Physical Exam General: The patient is alert and oriented x3 in no acute distress.  Dermatology:  Superficial breakdown of the skin noted diffusely throughout the entire foot and leg right lower extremity beginning just below the level of the knee.  Please see above noted photo.  Serous drainage noted.  Skin is cool to touch  Vascular: Lower extremity angiography 05/27/2022 performed by Dr. Lemar Livings, vein and vascular.  Please see report.  Being closely monitored with vascular  Increased redness noted to the right lower extremity  Neurological: Light touch and protective threshold diminished bilaterally.   Musculoskeletal Exam: No pedal deformity.  No prior amputations.  Muscle strength 5/5 all compartments  Radiographic exam RT foot 08/12/2022: Mostly unchanged from prior x-rays.  Stable.  No radiolucency or gas within the tissues and does not present on prior x-rays.  No radiographic evidence of obvious osseous erosions or signs of osteomyelitis  Assessment: 1.  Pain due to onychomycosis of toenails both 2. diabetes mellitus w/ peripheral neuropathy  Plan of Care:  -Patient evaluated.  -Mechanical debridement of nails 1-5 bilateral was performed using a nail nipper without incident or bleeding -Concerning the right lower extremity, the patient has been on oral ciprofloxacin with improvement.  He has an appointment this upcoming Wednesday, 04/23/2023 with Dr. Randie Heinz, [facility]. -Return to clinic 3 months  Felecia Shelling, DPM [facility]  Dr. Felecia Shelling, DPM    [address] [place], [place] [address]                Office [phone]  Fax [phone]

## 2023-04-22 ENCOUNTER — Telehealth: Payer: Self-pay | Admitting: Family Medicine

## 2023-04-22 NOTE — Telephone Encounter (Signed)
Patient dropped off [employer] enrollment application. Patient has completed his part and requested for the rest to be completed. Requested for this to be sent via fax, I have left this and there fax number in your folder up front. Let me know if you need anything else. Thanks!

## 2023-04-23 ENCOUNTER — Encounter: Payer: Self-pay | Admitting: Pharmacist

## 2023-04-23 ENCOUNTER — Ambulatory Visit (INDEPENDENT_AMBULATORY_CARE_PROVIDER_SITE_OTHER): Payer: Medicare Other

## 2023-04-23 ENCOUNTER — Ambulatory Visit (INDEPENDENT_AMBULATORY_CARE_PROVIDER_SITE_OTHER): Payer: Medicare Other | Admitting: Physician Assistant

## 2023-04-23 ENCOUNTER — Ambulatory Visit (HOSPITAL_COMMUNITY)
Admission: RE | Admit: 2023-04-23 | Discharge: 2023-04-23 | Disposition: A | Discharge status: Home / Self Care | Payer: Medicare Other | Source: Ambulatory Visit | Attending: Vascular Surgery | Admitting: Vascular Surgery

## 2023-04-23 VITALS — BP 158/79 | HR 87 | Temp 98.1°F | Resp 20 | Ht 72.0 in | Wt 180.4 lb

## 2023-04-23 DIAGNOSIS — I739 Peripheral vascular disease, unspecified: Secondary | ICD-10-CM | POA: Diagnosis not present

## 2023-04-23 DIAGNOSIS — L97519 Non-pressure chronic ulcer of other part of right foot with unspecified severity: Secondary | ICD-10-CM

## 2023-04-23 DIAGNOSIS — I5042 Chronic combined systolic (congestive) and diastolic (congestive) heart failure: Secondary | ICD-10-CM | POA: Diagnosis not present

## 2023-04-23 DIAGNOSIS — I255 Ischemic cardiomyopathy: Secondary | ICD-10-CM

## 2023-04-23 LAB — CUP PACEART REMOTE DEVICE CHECK
Battery Remaining Longevity: 64 mo
Battery Remaining Percentage: 70 %
Battery Voltage: 2.96 V
Brady Statistic AP VP Percent: 12 %
Brady Statistic AP VS Percent: 1 %
Brady Statistic AS VP Percent: 88 %
Brady Statistic AS VS Percent: 1 %
Brady Statistic RA Percent Paced: 12 %
Date Time Interrogation Session: 20241218021002
HighPow Impedance: 70 Ohm
Implantable Lead Connection Status: 753985
Implantable Lead Connection Status: 753985
Implantable Lead Connection Status: 753985
Implantable Lead Implant Date: 20140313
Implantable Lead Implant Date: 20140313
Implantable Lead Implant Date: 20140313
Implantable Lead Location: 753858
Implantable Lead Location: 753859
Implantable Lead Location: 753860
Implantable Pulse Generator Implant Date: 20220622
Lead Channel Impedance Value: 350 Ohm
Lead Channel Impedance Value: 380 Ohm
Lead Channel Impedance Value: 700 Ohm
Lead Channel Pacing Threshold Amplitude: 0.75 V
Lead Channel Pacing Threshold Amplitude: 0.75 V
Lead Channel Pacing Threshold Amplitude: 2 V
Lead Channel Pacing Threshold Pulse Width: 0.5 ms
Lead Channel Pacing Threshold Pulse Width: 0.5 ms
Lead Channel Pacing Threshold Pulse Width: 0.6 ms
Lead Channel Sensing Intrinsic Amplitude: 1.9 mV
Lead Channel Sensing Intrinsic Amplitude: 12 mV
Lead Channel Setting Pacing Amplitude: 1.75 V
Lead Channel Setting Pacing Amplitude: 2 V
Lead Channel Setting Pacing Amplitude: 2.5 V
Lead Channel Setting Pacing Pulse Width: 0.5 ms
Lead Channel Setting Pacing Pulse Width: 0.6 ms
Lead Channel Setting Sensing Sensitivity: 0.5 mV
Pulse Gen Serial Number: 810030647

## 2023-04-23 LAB — VAS US ABI WITH/WO TBI
Left ABI: 0.88
Right ABI: 0.53

## 2023-04-23 MED ORDER — SILVER SULFADIAZINE 1 % EX CREA: 1.0000 | TOPICAL_CREAM | Freq: Every day | CUTANEOUS | 0 refills | Status: DC

## 2023-04-23 NOTE — Progress Notes (Signed)
[facility] [place] HISTORY AND PHYSICAL   History of Present Illness:  Patient is a 77 y.o. year old male who presents for evaluation of PAD.  Left heart cath and stent placement and had subsequent adverse reaction to either Plavix or antibiotics. He has had on/off right lower extremity skin peeling/weeping.  He has an appointment with an allergist tomorrow.  He was referred to a dermatologist but he didn't want to wait that long.  He states his right leg has been worse that it is today.  He denies fever and chills.  He uses silvadene cream and guaze dressings.  He states he does not tolerate compression. He also states he does not elevate his leg very often.    He has a history of He has a history of right CFA endarterectomy including EIA, profunda and SFA endarterectomy with vein patch angioplasty with right GSV for CLI with tissue loss on 11/19/2022 by Dr. Randie Heinz. He has hx of EVAR 12/03/2011 by Dr. Edilia Bo.    He had tissue loss on the on the bottom of his foot is better as is his skin on the right lower leg.  At his last visit on 01/22/23 he has healed the wounds.  He was seen by [facility] 04/21/23 for nail trims.  The right LE has erythema, edema and skin "cracking."  Raw skin anterior shin/medial malleolus.  Weeping clear fluid.  He is ambulatory without claudication symptoms.  He denies rest pain or new non healing foot wounds.    The pt is on a statin for cholesterol management.    The pt is on an aspirin.    Other AC:  Effient for cardiac stents The pt is on CCB, BB, diuretic for hypertension.  The pt is  on medication for diabetes. Tobacco hx:  former    Past Medical History:  Diagnosis Date   AAA (abdominal aortic aneurysm) (HCC) 2011   Per vascular surgery   Atrial fibrillation (HCC)    CAD (coronary artery disease)    Presumed CAD with nuclear scan October 09, 2011,  large anteroseptal MI and inferior MI. Catheterization scheduled October 15, 2011   Cardiomyopathy [facility])     Nuclear, October 09, 2011, EF 30%, multiple focal wall motion abnormalities   Chronic kidney disease    CKD3   COVID-19    2021   Diabetes mellitus    type II   GERD (gastroesophageal reflux disease)    Silent   HLD (hyperlipidemia)    Hypertension    white coat HTN-- often elevated in office and controlled on outside checks.   ICD (implantable cardioverter-defibrillator) in place    CRT-D placed March, 2014 complete heart block and k dysfunction   IPF (idiopathic pulmonary fibrosis) (HCC)    LBBB (left bundle branch block)    LBBB on EKG October 11, 2011,  no prior EKG has been done   Low testosterone    Hx of   On home oxygen therapy    2L as needed   Pacemaker    PAD (peripheral artery disease) (HCC)    Pancreatitis    Peripheral arterial disease (HCC)    Umbilical hernia     Past Surgical History:  Procedure Laterality Date   ABDOMINAL AORTAGRAM N/A 10/28/2011   Procedure: ABDOMINAL Ronny Flurry;  Surgeon: [name], MD;  Location: [facility] CATH LAB;  Service: Cardiovascular;  Laterality: N/A;   ABDOMINAL AORTIC ANEURYSM REPAIR     EVAR    ABDOMINAL  AORTOGRAM N/A 05/27/2022   Procedure: ABDOMINAL AORTOGRAM;  Surgeon: Maeola Harman, MD;  Location: [facility] INVASIVE CV LAB;  Service: Cardiovascular;  Laterality: N/A;   BI-VENTRICULAR IMPLANTABLE CARDIOVERTER DEFIBRILLATOR N/A 07/16/2012   Procedure: BI-VENTRICULAR IMPLANTABLE CARDIOVERTER DEFIBRILLATOR  (CRT-D);  Surgeon: Duke Salvia, MD;  Location: [facility] CATH LAB;  Service: Cardiovascular;  Laterality: N/A;   BIV ICD GENERATOR CHANGEOUT N/A 10/25/2020   Procedure: BIV ICD GENERATOR CHANGEOUT;  Surgeon: Duke Salvia, MD;  Location: [facility] INVASIVE CV LAB;  Service: Cardiovascular;  Laterality: N/A;   CARDIAC CATHETERIZATION     CATARACT EXTRACTION Left 2023   COLONOSCOPY  03/23/2018; 2020   CORONARY CTO INTERVENTION N/A 10/10/2022   Procedure: CORONARY CTO INTERVENTION;  Surgeon: Corky Crafts, MD;  Location: [facility]  INVASIVE CV LAB;  Service: Cardiovascular;  Laterality: N/A;   CORONARY LITHOTRIPSY N/A 09/18/2022   Procedure: CORONARY LITHOTRIPSY;  Surgeon: Corky Crafts, MD;  Location: [facility] INVASIVE CV LAB;  Service: Cardiovascular;  Laterality: N/A;   CORONARY STENT INTERVENTION N/A 09/18/2022   Procedure: CORONARY STENT INTERVENTION;  Surgeon: Corky Crafts, MD;  Location: [facility] INVASIVE CV LAB;  Service: Cardiovascular;  Laterality: N/A;   CORONARY ULTRASOUND/IVUS N/A 09/18/2022   Procedure: Coronary Ultrasound/IVUS;  Surgeon: Corky Crafts, MD;  Location: [facility] INVASIVE CV LAB;  Service: Cardiovascular;  Laterality: N/A;   ENDARTERECTOMY FEMORAL Right 11/19/2022   Procedure: RIGHT COMMON FEMORAL ENDARTERECTOMY WITH VEIN PATCH ANGIOPLASTY;  Surgeon: Maeola Harman, MD;  Location: [facility] OR;  Service: Vascular;  Laterality: Right;   ESOPHAGOGASTRODUODENOSCOPY N/A 02/12/2019   Procedure: ESOPHAGOGASTRODUODENOSCOPY (EGD);  Surgeon: Lynann Bologna, MD;  Location: [facility] ENDOSCOPY;  Service: Endoscopy;  Laterality: N/A;   ESOPHAGOGASTRODUODENOSCOPY (EGD) WITH PROPOFOL N/A 02/05/2018   Procedure: ESOPHAGOGASTRODUODENOSCOPY (EGD) WITH PROPOFOL;  Surgeon: Napoleon Form, MD;  Location: [facility] ENDOSCOPY;  Service: Endoscopy;  Laterality: N/A;   ESOPHAGOGASTRODUODENOSCOPY (EGD) WITH PROPOFOL N/A 09/27/2018   Procedure: ESOPHAGOGASTRODUODENOSCOPY (EGD) WITH PROPOFOL;  Surgeon: Jeani Hawking, MD;  Location: [facility] ENDOSCOPY;  Service: Endoscopy;  Laterality: N/A;   FOREIGN BODY REMOVAL  09/27/2018   Procedure: FOREIGN BODY REMOVAL;  Surgeon: Jeani Hawking, MD;  Location: [facility] ENDOSCOPY;  Service: Endoscopy;;   FOREIGN BODY REMOVAL  02/12/2019   Procedure: FOREIGN BODY REMOVAL;  Surgeon: Lynann Bologna, MD;  Location: [facility] ENDOSCOPY;  Service: Endoscopy;;   HERNIA REPAIR  05/14/2013   INSERTION OF MESH N/A 05/14/2013   Procedure: INSERTION OF MESH;  Surgeon: Ardeth Sportsman, MD;  Location: [facility] OR;  Service:  General;  Laterality: N/A;   LEFT HEART CATH N/A 10/10/2022   Procedure: Left Heart Cath;  Surgeon: Corky Crafts, MD;  Location: [facility] INVASIVE CV LAB;  Service: Cardiovascular;  Laterality: N/A;   LEFT HEART CATH AND CORONARY ANGIOGRAPHY N/A 07/17/2021   Procedure: LEFT HEART CATH AND CORONARY ANGIOGRAPHY;  Surgeon: Lennette Bihari, MD;  Location: [facility] INVASIVE CV LAB;  Service: Cardiovascular;  Laterality: N/A;   LEFT HEART CATH AND CORONARY ANGIOGRAPHY N/A 09/18/2022   Procedure: LEFT HEART CATH AND CORONARY ANGIOGRAPHY;  Surgeon: Corky Crafts, MD;  Location: [facility] INVASIVE CV LAB;  Service: Cardiovascular;  Laterality: N/A;   LOWER EXTREMITY ANGIOGRAM Bilateral 10/28/2011   Procedure: LOWER EXTREMITY ANGIOGRAM;  Surgeon: [name], MD;  Location: [facility] CATH LAB;  Service: Cardiovascular;  Laterality: Bilateral;   LOWER EXTREMITY ANGIOGRAM Right 11/19/2022   Procedure: RIGHT LOWER EXTREMITY ANGIOGRAM;  Surgeon: Maeola Harman, MD;  Location: [facility] OR;  Service: Vascular;  Laterality: Right;   LOWER EXTREMITY ANGIOGRAPHY  05/27/2022   Procedure: Lower Extremity Angiography;  Surgeon: Maeola Harman, MD;  Location: [facility] INVASIVE CV LAB;  Service: Cardiovascular;;   PACEMAKER INSERTION  07/16/2012   pacemaker/defibrilator   PATCH ANGIOPLASTY Right 11/19/2022   Procedure: VEIN PATCH ANGIOPLASTY;  Surgeon: Maeola Harman, MD;  Location: [facility] OR;  Service: Vascular;  Laterality: Right;   POLYPECTOMY     POSTERIOR CERVICAL FUSION/FORAMINOTOMY N/A 06/09/2014   Procedure: Laminectomy - Cervical two-Cervcial four posterior cervical instrumented fusion Cervical two-cervical four;  Surgeon: Tia Alert, MD;  Location: [facility] NEURO ORS;  Service: Neurosurgery;  Laterality: N/A;  posterior    SAVORY DILATION N/A 02/05/2018   Procedure: SAVORY DILATION;  Surgeon: Napoleon Form, MD;  Location: [facility] ENDOSCOPY;  Service: Endoscopy;  Laterality: N/A;   TONSILLECTOMY      as a child    UMBILICAL HERNIA REPAIR N/A 05/14/2013   Procedure: LAPAROSCOPIC exploration and repair of hernia in abdominal ;  Surgeon: Ardeth Sportsman, MD;  Location: [facility] OR;  Service: General;  Laterality: N/A;   UPPER GASTROINTESTINAL ENDOSCOPY  2020    ROS:   General:  No weight loss, Fever, chills  HEENT: No recent headaches, no nasal bleeding, no visual changes, no sore throat  Neurologic: No dizziness, blackouts, seizures. No recent symptoms of stroke or mini- stroke. No recent episodes of slurred speech, or temporary blindness.  Cardiac: No recent episodes of chest pain/pressure, no shortness of breath at rest.  No shortness of breath with exertion.  Denies history of atrial fibrillation or irregular heartbeat  Vascular: No history of rest pain in feet.  No history of claudication.  positive history of non-healing ulcer, No history of DVT   Pulmonary: PRN home oxygen, no productive cough, no hemoptysis,  No asthma or wheezing  Musculoskeletal:  [x ] Arthritis, [ ]  Low back pain,  [ ]  Joint pain  Hematologic:No history of hypercoagulable state.  No history of easy bleeding.  No history of anemia  Gastrointestinal: No hematochezia or melena,  No gastroesophageal reflux, no trouble swallowing  Urinary: [ ]  chronic Kidney disease, [ ]  on HD - [ ]  MWF or [ ]  TTHS, [ ]  Burning with urination, [ ]  Frequent urination, [ ]  Difficulty urinating;   Skin: No rashes  Psychological: No history of anxiety,  No history of depression  Social History Social History   Tobacco Use   Smoking status: Former    Current packs/day: 0.00    Average packs/day: 2.0 packs/day for 42.0 years (84.0 ttl pk-yrs)    Types: Cigarettes    Start date: 1964    Quit date: 05/06/2004    Years since quitting: 18.9    Passive exposure: Never   Smokeless tobacco: Never  Vaping Use   Vaping status: Never Used  Substance Use Topics   Alcohol use: Not Currently   Drug use: No    Family History Family  History  Problem Relation Age of Onset   Hypertension Mother    Stroke Mother    Hyperlipidemia Mother    Lung cancer Father    Diabetes Sister    Heart disease Sister        Before age 59   Hypertension Sister    Hyperlipidemia Sister    Heart attack Sister    Hypertension Son    Colon cancer Neg Hx    Prostate cancer Neg Hx    Esophageal cancer Neg Hx  Stomach cancer Neg Hx    Rectal cancer Neg Hx    Colon polyps Neg Hx    Pancreatic cancer Neg Hx     Allergies  Allergies  Allergen Reactions   Aldactone [Spironolactone] Other (See Comments)    Hyperkalemia   Brilinta [Ticagrelor] Shortness Of Breath    Numbness in hands and feet Blurry vision   Clopidogrel Rash    Redness and Itchiness   Codeine Rash and Hives   Amoxicillin Hives   Atorvastatin     Myalgias with lipitor.  Does tolerate simvastatin.     Benadryl [Diphenhydramine] Hives   Januvia [Sitagliptin] Other (See Comments)    Diarrhea and heart racing   Jardiance [Empagliflozin] Other (See Comments)    Polyuria; excessive weight loss   Lisinopril     Possible cause of pancreatitis   Rosuvastatin Other (See Comments)    myalgia   Lasix [Furosemide] Rash     Current Outpatient Medications  Medication Sig Dispense Refill   albuterol (VENTOLIN HFA) 108 (90 Base) MCG/ACT inhaler INHALE 2 PUFFS INTO THE LUNGS EVERY 6 HOURS AS NEEDED FOR WHEEZING OR SHORTNESS OF BREATH 6.7 g 3   aspirin EC 81 MG tablet Take 1 tablet (81 mg total) by mouth daily. 120 tablet 2   carvedilol (COREG) 25 MG tablet TAKE 1 TABLET BY MOUTH TWICE DAILY WITH MEALS 180 tablet 3   Cholecalciferol (VITAMIN D3) 50 MCG (2000 UT) TABS Take 2,000 Units by mouth in the morning.     Cyanocobalamin (VITAMIN B-12 PO) Take 2,000 mcg by mouth in the morning.     glipiZIDE (GLUCOTROL) 5 MG tablet TAKE 1 TABLET(5 MG) BY MOUTH TWICE DAILY BEFORE A MEAL 180 tablet 1   glucose blood (ACCU-CHEK AVIVA PLUS) test strip USE AS DIRECTED TO TEST BLOOD SUGAR  TWICE DAILY 200 strip 3   insulin glargine (LANTUS SOLOSTAR) 100 UNIT/ML Solostar Pen INJECT 0.3 TO 0.35 MLS(30 TO 35 UNITS) INTO THE SKIN EVERY DAY 15 mL 3   metFORMIN (GLUCOPHAGE) 500 MG tablet TAKE 2 TABLETS BY MOUTH TWICE DAILY WITH FOOD 360 tablet 3   Multiple Vitamin (MULTIVITAMIN WITH MINERALS) TABS tablet Take 1 tablet by mouth in the morning.     omeprazole (PRILOSEC) 40 MG capsule Take 40 mg by mouth daily.     prasugrel (EFFIENT) 10 MG TABS tablet Take 1 tablet (10 mg total) by mouth daily. 90 tablet 1   silver sulfADIAZINE (SILVADENE) 1 % cream Apply 1 Application topically daily. PCP to refill 400 g 0   simvastatin (ZOCOR) 20 MG tablet Take 1 tablet (20 mg total) by mouth at bedtime. 90 tablet 3   SSD 1 % cream APPLY TOPICALLY TO THE AFFECTED AREA DAILY 50 g 1   torsemide (DEMADEX) 20 MG tablet Take 1 tablet (20 mg total) by mouth daily. 90 tablet 2   umeclidinium bromide (INCRUSE ELLIPTA) 62.5 MCG/ACT AEPB Inhale 1 puff into the lungs daily. 90 each 3   valsartan (DIOVAN) 40 MG tablet Take 1 tablet (40 mg total) by mouth daily with supper. 90 tablet 1   No current facility-administered medications for this visit.    Physical Examination  Vitals:   04/23/23 1332  BP: (!) 158/79  Pulse: 87  Resp: 20  Temp: 98.1 F (36.7 C)  TempSrc: Temporal  SpO2: 98%  Weight: 180 lb 6.4 oz (81.8 kg)  Height: 6' (1.829 m)    Body mass index is 24.47 kg/m.  General:  Alert and oriented,  no acute distress HEENT: Normal Neck: No bruit or JVD Pulmonary: Clear to auscultation bilaterally Cardiac: Regular Rate and Rhythm without murmur Abdomen: Soft, non-tender, non-distended, no mass, no scars Skin: erythema, weeping, peeling skin right LE knee to ankle  Musculoskeletal: positive mild right LE edema  Neurologic: Upper and lower extremity motor grossly intact  and symmetric  DATA:  ABI Findings:  +---------+------------------+-----+----------+--------+  Right   Rt Pressure  (mmHg)IndexWaveform  Comment   +---------+------------------+-----+----------+--------+  Brachial 114                                        +---------+------------------+-----+----------+--------+  PTA     66                0.52 monophasic          +---------+------------------+-----+----------+--------+  DP      67                0.53 monophasic          +---------+------------------+-----+----------+--------+  Great Toe25                0.20 Abnormal            +---------+------------------+-----+----------+--------+   +---------+------------------+-----+----------+-------+  Left    Lt Pressure (mmHg)IndexWaveform  Comment  +---------+------------------+-----+----------+-------+  Brachial 127                                       +---------+------------------+-----+----------+-------+  PTA     112               0.88 monophasic         +---------+------------------+-----+----------+-------+  DP      87                0.69 monophasic         +---------+------------------+-----+----------+-------+  Great Toe57                0.45 Abnormal           +---------+------------------+-----+----------+-------+   +-------+-----------+-----------+------------+------------+  ABI/TBIToday's ABIToday's TBIPrevious ABIPrevious TBI  +-------+-----------+-----------+------------+------------+  Right 0.53       0.20       0.58        0.13          +-------+-----------+-----------+------------+------------+  Left  0.88       0.45       0.90        0.68          +-------+-----------+-----------+------------+------------+      Right ABIs and TBIs appear essentially unchanged. Left ABIs appear  essentially unchanged.    Summary:  Right: Resting right ankle-brachial index indicates moderate right lower  extremity arterial disease. The right toe-brachial index is abnormal.   Left: Resting left ankle-brachial index indicates  mild left lower  extremity arterial disease. The left toe-brachial index is abnormal.     ASSESSMENT/PLAN:  PAD s/p right CFA endarterectomy including EIA, profunda and SFA endarterectomy with vein patch angioplasty with right GSV for CLI with tissue loss on 11/19/2022 by Dr. Randie Heinz. He has hx of EVAR 12/03/2011 by Dr. Edilia Bo.    He has an allergic reaction or drug interaction that has cause his right LE to breakout, weeping clear fluid from the knee to the ankle.  He ha no new foot  wounds.  He uses silvadene cream and dry guaze.  I suggested he elevate his right LE as well.  He states it looks good today compared to previous.  I refilled his silvadene cream today.  His PCP will refill it in the future.   The ABI's are unchanged over all.  He is ambulatory with SOB being his deterrent not claudication symptoms.  He has an appointment with an allergist tomorrow.  He will stay as active as he can daily to increase demand of inflow to his lower extremities.  He will f/u in 6 months for repeat ABIs.  If he has concerns he will call our office.       Mosetta Pigeon PA-C [facility] [place] Office: [phone]  MD in clinic [place]

## 2023-04-23 NOTE — Progress Notes (Signed)
Manufacturer Assistance Program (MAP) Application   Manufacturer: [employer]    (Re-enrollment) Medication(s): Lantus  Patient Portion of Application:  12/18: Dropped off at clinic by patient. Letter from [employer] titled "Medicare Part D Patient Assistance Program Re-enrollment Form". Has been signed by patient.   Provider Portion of Application:  12/18: Provider portion placed in PCP inbox for signature. Prescription(s): Included in MAP application.   Application Status: Not submitted (pending signatures)  Next Steps: []    PCP signature []    Upon signature(s) Application to be faxed to  [place], Valinda Hoar [phone]  AND scanned into patient chart []    Fax confirmation of approval received  Note routed to PCP Clinic Pool to ensure PCP signature is obtained and application is faxed.  *[facility] clinic team - Please Addend/update this note as the "Next Steps" are completed in office*

## 2023-04-24 ENCOUNTER — Other Ambulatory Visit: Payer: Self-pay | Admitting: *Deleted

## 2023-04-24 ENCOUNTER — Other Ambulatory Visit: Payer: Self-pay

## 2023-04-24 ENCOUNTER — Ambulatory Visit: Payer: Medicare Other

## 2023-04-24 ENCOUNTER — Ambulatory Visit (INDEPENDENT_AMBULATORY_CARE_PROVIDER_SITE_OTHER): Payer: Medicare Other | Admitting: Allergy & Immunology

## 2023-04-24 ENCOUNTER — Encounter: Payer: Self-pay | Admitting: Allergy & Immunology

## 2023-04-24 VITALS — BP 128/64 | HR 84 | Temp 97.9°F | Ht 72.0 in | Wt 180.3 lb

## 2023-04-24 VITALS — Ht 72.0 in | Wt 172.0 lb

## 2023-04-24 DIAGNOSIS — Z888 Allergy status to other drugs, medicaments and biological substances status: Secondary | ICD-10-CM

## 2023-04-24 DIAGNOSIS — J84112 Idiopathic pulmonary fibrosis: Secondary | ICD-10-CM

## 2023-04-24 DIAGNOSIS — E1159 Type 2 diabetes mellitus with other circulatory complications: Secondary | ICD-10-CM

## 2023-04-24 DIAGNOSIS — T50905D Adverse effect of unspecified drugs, medicaments and biological substances, subsequent encounter: Secondary | ICD-10-CM | POA: Diagnosis not present

## 2023-04-24 DIAGNOSIS — R234 Changes in skin texture: Secondary | ICD-10-CM

## 2023-04-24 DIAGNOSIS — Z0000 Encounter for general adult medical examination without abnormal findings: Secondary | ICD-10-CM | POA: Diagnosis not present

## 2023-04-24 MED ORDER — TRIAMCINOLONE ACETONIDE 0.1 % EX OINT: 1.0000 | TOPICAL_OINTMENT | Freq: Two times a day (BID) | CUTANEOUS | 1 refills | Status: DC

## 2023-04-24 MED ORDER — PREDNISONE 10 MG PO TABS: ORAL_TABLET | ORAL | 0 refills | Status: DC

## 2023-04-24 NOTE — Patient Instructions (Signed)
Mr. Luke [name] , Thank you for taking time to come for your Medicare Wellness Visit. I appreciate your ongoing commitment to your health goals. Please review the following plan we discussed and let me know if I can assist you in the future.   Referrals/Orders/Follow-Ups/Clinician Recommendations: none  This is a list of the screening recommended for you and due dates:  Health Maintenance  Topic Date Due   Zoster (Shingles) Vaccine (1 of 2) Never done   DTaP/Tdap/Td vaccine (2 - Tdap) 07/04/2020   Colon Cancer Screening  10/13/2022   Eye exam for diabetics  12/28/2022   COVID-19 Vaccine (6 - 2024-25 season) 01/05/2023   Yearly kidney health urinalysis for diabetes  04/17/2023   Complete foot exam   04/17/2023   Flu Shot  08/04/2023*   Hemoglobin A1C  09/14/2023   Yearly kidney function blood test for diabetes  01/09/2024   Medicare Annual Wellness Visit  04/23/2024   Pneumonia Vaccine  Completed   Hepatitis C Screening  Completed   HPV Vaccine  Aged Out  *Topic was postponed. The date shown is not the original due date.    Advanced directives: (Copy Requested) Please bring a copy of your health care power of attorney and living will to the office to be added to your chart at your convenience.  Next Medicare Annual Wellness Visit scheduled for next year: Yes 04/23/24 @ 10:50am televisit

## 2023-04-24 NOTE — Progress Notes (Signed)
Subjective:   Luke [name] is a 77 y.o. male who presents for Medicare Annual/Subsequent preventive examination.  Visit Complete: Virtual I connected with  Luke [name] on 04/24/23 by a audio enabled telemedicine application and verified that I am speaking with the correct person using two identifiers.  Patient Location: Home  Provider Location: Home Office  I discussed the limitations of evaluation and management by telemedicine. The patient expressed understanding and agreed to proceed.  Vital Signs: Because this visit was a virtual/telehealth visit, some criteria may be missing or patient reported. Any vitals not documented were not able to be obtained and vitals that have been documented are patient reported.  Patient Medicare AWV questionnaire was completed by the patient on (not done); I have confirmed that all information answered by patient is correct and no changes since this date. Cardiac Risk Factors include: advanced age (>64men, >30 women);hypertension;dyslipidemia;diabetes mellitus;male gender;sedentary lifestyle    Objective:    Today's Vitals   04/24/23 1051  Weight: 172 lb (78 kg)  Height: 6' (1.829 m)   Body mass index is 23.33 kg/m.     04/24/2023   11:05 AM 12/31/2022    4:36 PM 11/21/2022   12:17 PM 11/19/2022    7:35 AM 10/30/2022   10:31 PM 10/23/2022    1:16 PM 10/10/2022    6:29 AM  Advanced Directives  Does Patient Have a Medical Advance Directive? Yes No No Yes Yes Yes Yes  Type of Estate agent of [place];Living will   Healthcare Power of [employer] Power of [employer] Power of [place];Living will Healthcare Power of [place];Living will  Does patient want to make changes to medical advance directive?   No - Patient declined  No - Patient declined No - Guardian declined No - Patient declined  Copy of Healthcare Power of Attorney in Chart? No - copy requested    No - copy requested Yes - validated most  recent copy scanned in chart (See row information) Yes - validated most recent copy scanned in chart (See row information)  Would patient like information on creating a medical advance directive?   No - Patient declined        Current Medications (verified) Outpatient Encounter Medications as of 04/24/2023  Medication Sig   albuterol (VENTOLIN HFA) 108 (90 Base) MCG/ACT inhaler INHALE 2 PUFFS INTO THE LUNGS EVERY 6 HOURS AS NEEDED FOR WHEEZING OR SHORTNESS OF BREATH   aspirin EC 81 MG tablet Take 1 tablet (81 mg total) by mouth daily.   carvedilol (COREG) 25 MG tablet TAKE 1 TABLET BY MOUTH TWICE DAILY WITH MEALS   Cholecalciferol (VITAMIN D3) 50 MCG (2000 UT) TABS Take 2,000 Units by mouth in the morning.   Cyanocobalamin (VITAMIN B-12 PO) Take 2,000 mcg by mouth in the morning.   glipiZIDE (GLUCOTROL) 5 MG tablet TAKE 1 TABLET(5 MG) BY MOUTH TWICE DAILY BEFORE A MEAL   glucose blood (ACCU-CHEK AVIVA PLUS) test strip USE AS DIRECTED TO TEST BLOOD SUGAR TWICE DAILY   insulin glargine (LANTUS SOLOSTAR) 100 UNIT/ML Solostar Pen INJECT 0.3 TO 0.35 MLS(30 TO 35 UNITS) INTO THE SKIN EVERY DAY   metFORMIN (GLUCOPHAGE) 500 MG tablet TAKE 2 TABLETS BY MOUTH TWICE DAILY WITH FOOD   Multiple Vitamin (MULTIVITAMIN WITH MINERALS) TABS tablet Take 1 tablet by mouth in the morning.   omeprazole (PRILOSEC) 40 MG capsule Take 40 mg by mouth daily.   prasugrel (EFFIENT) 10 MG TABS tablet Take 1 tablet (10  mg total) by mouth daily.   silver sulfADIAZINE (SILVADENE) 1 % cream Apply 1 Application topically daily. PCP to refill   simvastatin (ZOCOR) 20 MG tablet Take 1 tablet (20 mg total) by mouth at bedtime.   SSD 1 % cream APPLY TOPICALLY TO THE AFFECTED AREA DAILY   torsemide (DEMADEX) 20 MG tablet Take 1 tablet (20 mg total) by mouth daily.   umeclidinium bromide (INCRUSE ELLIPTA) 62.5 MCG/ACT AEPB Inhale 1 puff into the lungs daily.   valsartan (DIOVAN) 40 MG tablet Take 1 tablet (40 mg total) by mouth  daily with supper.   No facility-administered encounter medications on file as of 04/24/2023.    Allergies (verified) Aldactone [spironolactone], Brilinta [ticagrelor], Clopidogrel, Codeine, Amoxicillin, Atorvastatin, Benadryl [diphenhydramine], Januvia [sitagliptin], Jardiance [empagliflozin], Lisinopril, Rosuvastatin, and Lasix [furosemide]   History: Past Medical History:  Diagnosis Date   AAA (abdominal aortic aneurysm) (HCC) 2011   Per vascular surgery   Atrial fibrillation (HCC)    CAD (coronary artery disease)    Presumed CAD with nuclear scan October 09, 2011,  large anteroseptal MI and inferior MI. Catheterization scheduled October 15, 2011   Cardiomyopathy [facility])    Nuclear, October 09, 2011, EF 30%, multiple focal wall motion abnormalities   Chronic kidney disease    CKD3   COVID-19    2021   Diabetes mellitus    type II   GERD (gastroesophageal reflux disease)    Silent   HLD (hyperlipidemia)    Hypertension    white coat HTN-- often elevated in office and controlled on outside checks.   ICD (implantable cardioverter-defibrillator) in place    CRT-D placed March, 2014 complete heart block and k dysfunction   IPF (idiopathic pulmonary fibrosis) (HCC)    LBBB (left bundle branch block)    LBBB on EKG October 11, 2011,  no prior EKG has been done   Low testosterone    Hx of   On home oxygen therapy    2L as needed   Pacemaker    PAD (peripheral artery disease) (HCC)    Pancreatitis    Peripheral arterial disease (HCC)    Umbilical hernia    Past Surgical History:  Procedure Laterality Date   ABDOMINAL AORTAGRAM N/A 10/28/2011   Procedure: ABDOMINAL Ronny Flurry;  Surgeon: [name], MD;  Location: [facility] CATH LAB;  Service: Cardiovascular;  Laterality: N/A;   ABDOMINAL AORTIC ANEURYSM REPAIR     EVAR    ABDOMINAL AORTOGRAM N/A 05/27/2022   Procedure: ABDOMINAL AORTOGRAM;  Surgeon: Maeola Harman, MD;  Location: [facility] INVASIVE CV LAB;  Service: Cardiovascular;   Laterality: N/A;   BI-VENTRICULAR IMPLANTABLE CARDIOVERTER DEFIBRILLATOR N/A 07/16/2012   Procedure: BI-VENTRICULAR IMPLANTABLE CARDIOVERTER DEFIBRILLATOR  (CRT-D);  Surgeon: Duke Salvia, MD;  Location: [facility] CATH LAB;  Service: Cardiovascular;  Laterality: N/A;   BIV ICD GENERATOR CHANGEOUT N/A 10/25/2020   Procedure: BIV ICD GENERATOR CHANGEOUT;  Surgeon: Duke Salvia, MD;  Location: [facility] INVASIVE CV LAB;  Service: Cardiovascular;  Laterality: N/A;   CARDIAC CATHETERIZATION     CATARACT EXTRACTION Left 2023   COLONOSCOPY  03/23/2018; 2020   CORONARY CTO INTERVENTION N/A 10/10/2022   Procedure: CORONARY CTO INTERVENTION;  Surgeon: Corky Crafts, MD;  Location: [facility] INVASIVE CV LAB;  Service: Cardiovascular;  Laterality: N/A;   CORONARY LITHOTRIPSY N/A 09/18/2022   Procedure: CORONARY LITHOTRIPSY;  Surgeon: Corky Crafts, MD;  Location: [facility] INVASIVE CV LAB;  Service: Cardiovascular;  Laterality: N/A;   CORONARY STENT INTERVENTION N/A  09/18/2022   Procedure: CORONARY STENT INTERVENTION;  Surgeon: Corky Crafts, MD;  Location: [facility] INVASIVE CV LAB;  Service: Cardiovascular;  Laterality: N/A;   CORONARY ULTRASOUND/IVUS N/A 09/18/2022   Procedure: Coronary Ultrasound/IVUS;  Surgeon: Corky Crafts, MD;  Location: [facility] INVASIVE CV LAB;  Service: Cardiovascular;  Laterality: N/A;   ENDARTERECTOMY FEMORAL Right 11/19/2022   Procedure: RIGHT COMMON FEMORAL ENDARTERECTOMY WITH VEIN PATCH ANGIOPLASTY;  Surgeon: Maeola Harman, MD;  Location: [facility] OR;  Service: Vascular;  Laterality: Right;   ESOPHAGOGASTRODUODENOSCOPY N/A 02/12/2019   Procedure: ESOPHAGOGASTRODUODENOSCOPY (EGD);  Surgeon: Lynann Bologna, MD;  Location: [facility] ENDOSCOPY;  Service: Endoscopy;  Laterality: N/A;   ESOPHAGOGASTRODUODENOSCOPY (EGD) WITH PROPOFOL N/A 02/05/2018   Procedure: ESOPHAGOGASTRODUODENOSCOPY (EGD) WITH PROPOFOL;  Surgeon: Napoleon Form, MD;  Location: [facility] ENDOSCOPY;  Service: Endoscopy;   Laterality: N/A;   ESOPHAGOGASTRODUODENOSCOPY (EGD) WITH PROPOFOL N/A 09/27/2018   Procedure: ESOPHAGOGASTRODUODENOSCOPY (EGD) WITH PROPOFOL;  Surgeon: Jeani Hawking, MD;  Location: [facility] ENDOSCOPY;  Service: Endoscopy;  Laterality: N/A;   FOREIGN BODY REMOVAL  09/27/2018   Procedure: FOREIGN BODY REMOVAL;  Surgeon: Jeani Hawking, MD;  Location: [facility] ENDOSCOPY;  Service: Endoscopy;;   FOREIGN BODY REMOVAL  02/12/2019   Procedure: FOREIGN BODY REMOVAL;  Surgeon: Lynann Bologna, MD;  Location: [facility] ENDOSCOPY;  Service: Endoscopy;;   HERNIA REPAIR  05/14/2013   INSERTION OF MESH N/A 05/14/2013   Procedure: INSERTION OF MESH;  Surgeon: Ardeth Sportsman, MD;  Location: [facility] OR;  Service: General;  Laterality: N/A;   LEFT HEART CATH N/A 10/10/2022   Procedure: Left Heart Cath;  Surgeon: Corky Crafts, MD;  Location: [facility] INVASIVE CV LAB;  Service: Cardiovascular;  Laterality: N/A;   LEFT HEART CATH AND CORONARY ANGIOGRAPHY N/A 07/17/2021   Procedure: LEFT HEART CATH AND CORONARY ANGIOGRAPHY;  Surgeon: Lennette Bihari, MD;  Location: [facility] INVASIVE CV LAB;  Service: Cardiovascular;  Laterality: N/A;   LEFT HEART CATH AND CORONARY ANGIOGRAPHY N/A 09/18/2022   Procedure: LEFT HEART CATH AND CORONARY ANGIOGRAPHY;  Surgeon: Corky Crafts, MD;  Location: [facility] INVASIVE CV LAB;  Service: Cardiovascular;  Laterality: N/A;   LOWER EXTREMITY ANGIOGRAM Bilateral 10/28/2011   Procedure: LOWER EXTREMITY ANGIOGRAM;  Surgeon: [name], MD;  Location: [facility] CATH LAB;  Service: Cardiovascular;  Laterality: Bilateral;   LOWER EXTREMITY ANGIOGRAM Right 11/19/2022   Procedure: RIGHT LOWER EXTREMITY ANGIOGRAM;  Surgeon: Maeola Harman, MD;  Location: [facility] OR;  Service: Vascular;  Laterality: Right;   LOWER EXTREMITY ANGIOGRAPHY  05/27/2022   Procedure: Lower Extremity Angiography;  Surgeon: Maeola Harman, MD;  Location: [facility] INVASIVE CV LAB;  Service: Cardiovascular;;   PACEMAKER INSERTION  07/16/2012    pacemaker/defibrilator   PATCH ANGIOPLASTY Right 11/19/2022   Procedure: VEIN PATCH ANGIOPLASTY;  Surgeon: Maeola Harman, MD;  Location: [facility] OR;  Service: Vascular;  Laterality: Right;   POLYPECTOMY     POSTERIOR CERVICAL FUSION/FORAMINOTOMY N/A 06/09/2014   Procedure: Laminectomy - Cervical two-Cervcial four posterior cervical instrumented fusion Cervical two-cervical four;  Surgeon: Tia Alert, MD;  Location: [facility] NEURO ORS;  Service: Neurosurgery;  Laterality: N/A;  posterior    SAVORY DILATION N/A 02/05/2018   Procedure: SAVORY DILATION;  Surgeon: Napoleon Form, MD;  Location: [facility] ENDOSCOPY;  Service: Endoscopy;  Laterality: N/A;   TONSILLECTOMY     as a child    UMBILICAL HERNIA REPAIR N/A 05/14/2013   Procedure: LAPAROSCOPIC exploration and repair of hernia in abdominal ;  Surgeon: Ardeth Sportsman, MD;  Location: [facility] OR;  Service: General;  Laterality: N/A;   UPPER GASTROINTESTINAL ENDOSCOPY  2020   Family History  Problem Relation Age of Onset   Hypertension Mother    Stroke Mother    Hyperlipidemia Mother    Lung cancer Father    Diabetes Sister    Heart disease Sister        Before age 76   Hypertension Sister    Hyperlipidemia Sister    Heart attack Sister    Hypertension Son    Colon cancer Neg Hx    Prostate cancer Neg Hx    Esophageal cancer Neg Hx    Stomach cancer Neg Hx    Rectal cancer Neg Hx    Colon polyps Neg Hx    Pancreatic cancer Neg Hx    Social History   Socioeconomic History   Marital status: Married    Spouse name: Patti   Number of children: 1   Years of education: Not on file   Highest education level: Associate degree: occupational, Scientist, product/process development, or vocational program  Occupational History   Occupation: retired  Tobacco Use   Smoking status: Former    Current packs/day: 0.00    Average packs/day: 2.0 packs/day for 42.0 years (84.0 ttl pk-yrs)    Types: Cigarettes    Start date: 1964    Quit date: 05/06/2004    Years since  quitting: 18.9    Passive exposure: Never   Smokeless tobacco: Never  Vaping Use   Vaping status: Never Used  Substance and Sexual Activity   Alcohol use: Not Currently   Drug use: No   Sexual activity: Yes    Partners: Female  Other Topics Concern   Not on file  Social History Narrative   [place] vet, he has known service related agent orange exposure.    Retired   Community education officer daily.     Social Drivers of Corporate investment banker Strain: Low Risk  (04/24/2023)   Overall Financial Resource Strain (CARDIA)    Difficulty of Paying Living Expenses: Not very hard  Food Insecurity: No Food Insecurity (04/24/2023)   Hunger Vital Sign    Worried About Running Out of Food in the Last Year: Never true    Ran Out of Food in the Last Year: Never true  Transportation Needs: No Transportation Needs (04/24/2023)   PRAPARE - Administrator, Civil Service (Medical): No    Lack of Transportation (Non-Medical): No  Physical Activity: Inactive (04/24/2023)   Exercise Vital Sign    Days of Exercise per Week: 0 days    Minutes of Exercise per Session: 0 min  Stress: No Stress Concern Present (04/24/2023)   [employer] of Occupational Health - Occupational Stress Questionnaire    Feeling of Stress : Not at all  Social Connections: Moderately Integrated (04/24/2023)   Social Connection and Isolation Panel [NHANES]    Frequency of Communication with Friends and Family: Twice a week    Frequency of Social Gatherings with Friends and Family: Once a week    Attends Religious Services: Never    Database administrator or Organizations: Yes    Attends Engineer, structural: More than 4 times per year    Marital Status: Married    Tobacco Counseling Counseling given: Not Answered   Clinical Intake:  Pre-visit preparation completed: No  Pain : No/denies pain     BMI - recorded: 23.33 Nutritional Status: BMI of 19-24  Normal Nutritional Risks: None  Diabetes:  Yes CBG done?: Yes (BS 84 this am at home) CBG resulted in Enter/ Edit results?: No Did pt. bring in CBG monitor from home?: No  How often do you need to have someone help you when you read instructions, pamphlets, or other written materials from your doctor or pharmacy?: 1 - Never  Interpreter Needed?: No  Comments: lives with wife Information entered by :: B.Kristina Bertone,LPN   Activities of Daily Living    04/24/2023   11:06 AM 11/21/2022   12:16 PM  In your present state of health, do you have any difficulty performing the following activities:  Hearing? 1   Vision? 0   Difficulty concentrating or making decisions? 0   Walking or climbing stairs? 1   Dressing or bathing? 0   Doing errands, shopping? 0 0  Preparing Food and eating ? N   Using the Toilet? N   In the past six months, have you accidently leaked urine? N   Do you have problems with loss of bowel control? N   Managing your Medications? N   Managing your Finances? N   Housekeeping or managing your Housekeeping? N     Patient Care Team: [name], MD as PCP - General Jake Bathe, MD as PCP - Cardiology (Cardiology) Duke Salvia, MD as PCP - Electrophysiology (Cardiology) [name], MD (Inactive) (Vascular Surgery) Willis Modena, MD (Gastroenterology) Karie Soda, MD (General Surgery) Arman Bogus, MD (Neurosurgery) Shea Evans [facility]) Jake Bathe, MD (Cardiology) Kathyrn Sheriff, [facility] (Inactive) as Pharmacist (Pharmacist) Otho Ket, RN as [facility] Care Management  Indicate any recent Medical Services you may have received from other than [facility] providers in the past year (date may be approximate).     Assessment:   This is a routine wellness examination for Kohle.  Hearing/Vision screen Hearing Screening - Comments:: Pt says hearing is little diminished;will speak w/PCP Vision Screening - Comments:: Pt says his vision is good  w/glasses Dr Allyne Gee Dr Shea Evans   Goals Addressed               This Visit's Progress     COMPLETED: DIET - EAT MORE FRUITS AND VEGETABLES   On track     Increase physical activity (pt-stated)   Not on track     04/24/23-I will resume after SOB exercise for at least 1 hr 45 min 5 days per week.        Depression Screen    04/24/2023   11:00 AM 03/17/2023   10:36 AM 10/17/2022   10:01 AM 04/22/2022    2:04 PM 04/16/2022    9:58 AM 04/13/2021    3:51 PM 03/14/2020    2:28 PM  PHQ 2/9 Scores  PHQ - 2 Score 0 0 0 0 0 0 0  PHQ- 9 Score  0 0 0   0    Fall Risk    04/24/2023   10:56 AM 03/17/2023   10:36 AM 10/17/2022   10:01 AM 04/22/2022    2:06 PM 04/13/2021    3:49 PM  Fall Risk   Falls in the past year? 0 0 0 0 0  Number falls in past yr: 0 0 0 0 0  Injury with Fall? 0 0 0 0 0  Risk for fall due to : No Fall Risks No Fall Risks No Fall Risks History of fall(s) No Fall Risks  Follow up Falls prevention discussed;Education provided Falls evaluation completed Falls  evaluation completed Falls prevention discussed;Falls evaluation completed Falls evaluation completed    MEDICARE RISK AT HOME: Medicare Risk at Home Any stairs in or around the home?: Yes If so, are there any without handrails?: Yes Home free of loose throw rugs in walkways, pet beds, electrical cords, etc?: Yes Adequate lighting in your home to reduce risk of falls?: Yes Life alert?: No Use of a cane, walker or w/c?: No Grab bars in the bathroom?: Yes Shower chair or bench in shower?: No Elevated toilet seat or a handicapped toilet?: No  TIMED UP AND GO:  Was the test performed?  No    Cognitive Function:    11/19/2016    8:55 AM 11/14/2015    8:40 AM  MMSE - Mini Mental State Exam  Orientation to time 5 5  Orientation to Place 5 5  Registration 3 3  Attention/ Calculation 0 0  Recall 3 3  Language- name 2 objects 0 0  Language- repeat 1 1  Language- follow 3 step command 3 3  Language-  read & follow direction 0 0  Write a sentence 0 0  Copy design 0 0  Total score 20 20        04/24/2023   11:09 AM 04/22/2022    2:17 PM  6CIT Screen  What Year? 0 points 0 points  What month? 0 points 0 points  What time? 0 points 0 points  Count back from 20 0 points   Months in reverse 0 points   Repeat phrase 0 points   Total Score 0 points     Immunizations Immunization History  Administered Date(s) Administered   Fluad Quad(high Dose 65+) 02/23/2020, 04/13/2021   Influenza Split 01/31/2011, 02/11/2012   Influenza Whole 02/27/2010   Influenza, High Dose Seasonal PF 02/08/2022   Influenza,inj,Quad PF,6+ Mos 03/18/2013, 03/25/2014, 01/19/2015, 02/21/2016, 02/27/2017, 02/12/2018, 02/18/2019   Influenza-Unspecified 03/03/2020   PFIZER(Purple Top)SARS-COV-2 Vaccination 05/23/2019, 06/12/2019, 02/08/2020, 10/09/2020   Pfizer Covid-19 Vaccine Bivalent Booster 73yrs & up 08/02/2021   Pneumococcal Conjugate-13 10/18/2014   Pneumococcal Polysaccharide-23 11/14/2011   Td 07/05/2010    TDAP status: Up to date  Flu Vaccine status: Up to date  Pneumococcal vaccine status: Up to date  Covid-19 vaccine status: Completed vaccines  Qualifies for Shingles Vaccine? Yes   Zostavax completed No   Shingrix Completed?: No.    Education has been provided regarding the importance of this vaccine. Patient has been advised to call insurance company to determine out of pocket expense if they have not yet received this vaccine. Advised may also receive vaccine at local pharmacy or Health Dept. Verbalized acceptance and understanding.  Screening Tests Health Maintenance  Topic Date Due   Zoster Vaccines- Shingrix (1 of 2) Never done   DTaP/Tdap/Td (2 - Tdap) 07/04/2020   Colonoscopy  10/13/2022   OPHTHALMOLOGY EXAM  12/28/2022   COVID-19 Vaccine (6 - 2024-25 season) 01/05/2023   Diabetic kidney evaluation - Urine ACR  04/17/2023   FOOT EXAM  04/17/2023   INFLUENZA VACCINE  08/04/2023  (Originally 12/05/2022)   HEMOGLOBIN A1C  09/14/2023   Diabetic kidney evaluation - eGFR measurement  01/09/2024   Medicare Annual Wellness (AWV)  04/23/2024   Pneumonia Vaccine 32+ Years old  Completed   Hepatitis C Screening  Completed   HPV VACCINES  Aged Out    Health Maintenance  Health Maintenance Due  Topic Date Due   Zoster Vaccines- Shingrix (1 of 2) Never done   DTaP/Tdap/Td (2 - Tdap)  07/04/2020   Colonoscopy  10/13/2022   OPHTHALMOLOGY EXAM  12/28/2022   COVID-19 Vaccine (6 - 2024-25 season) 01/05/2023   Diabetic kidney evaluation - Urine ACR  04/17/2023   FOOT EXAM  04/17/2023    Colorectal cancer screening: No longer required.   Lung Cancer Screening: (Low Dose CT Chest recommended if Age 47-80 years, 20 pack-year currently smoking OR have quit w/in 15years.) does not qualify.   Lung Cancer Screening Referral: no  Additional Screening:  Hepatitis C Screening: does not qualify; Completed 05/17/2015  Vision Screening: Recommended annual ophthalmology exams for early detection of glaucoma and other disorders of the eye. Is the patient up to date with their annual eye exam?  Yes  Who is the provider or what is the name of the office in which the patient attends annual eye exams? Dr Shea Evans If pt is not established with a provider, would they like to be referred to a provider to establish care? No .   Dental Screening: Recommended annual dental exams for proper oral hygiene  Diabetic Foot Exam: Diabetic Foot Exam: Completed 05/22/22  Community Resource Referral / Chronic Care Management: CRR required this visit?  No   CCM required this visit?  No    Plan:     I have personally reviewed and noted the following in the patient's chart:   Medical and social history Use of alcohol, tobacco or illicit drugs  Current medications and supplements including opioid prescriptions. Patient is not currently taking opioid prescriptions. Functional ability and  status Nutritional status Physical activity Advanced directives List of other physicians Hospitalizations, surgeries, and ER visits in previous 12 months Vitals Screenings to include cognitive, depression, and falls Referrals and appointments  In addition, I have reviewed and discussed with patient certain preventive protocols, quality metrics, and best practice recommendations. A written personalized care plan for preventive services as well as general preventive health recommendations were provided to patient.    Sue Lush, LPN   09/81/1914   After Visit Summary: (MyChart) Due to this being a telephonic visit, the after visit summary with patients personalized plan was offered to patient via MyChart   Nurse Notes: The patient states they are doing alright but stating her has been experiencing SOB and is seeing specialist. He has no concerns or questions at this time.

## 2023-04-24 NOTE — Progress Notes (Signed)
NEW PATIENT  Date of Service/Encounter:  04/24/23  Consult requested by: [name], MD   Assessment:   Peeling skin  Adverse reaction to Plavix and Brilinta and possible Lasix  Shortness of breath - unclear etiology, although he does have idiopathic pulmonary fibrosis  Stage 3 kidney disease  No atopic history  Plan/Recommendations:   1. Peeling skin with likely adverse drug reaction - We are going to get some labs to look for weird autoimmune causes of the rash: - Antiskin Autoantibodies, Quant - CBC with Differential/Platelet - Allergens w/Comp Rflx Area 2 - CMP14+EGFR - Sedimentation rate - ANA, IFA (with reflex) - C-reactive protein - Tryptase - Definitely restart the Silvadene twice daily. - Add on triamcinolone ointment twice daily. - Start low dose prednisone taper: Take one tablet (10mg ) twice daily ten days, then one tablet (10mg ) once daily for ten days, then STOP.  - We may consider doing patch testing in the future to see if you are reacting to one of the over the counter ointments or something else in your environment. - But I want to get the rash under control first.  2. Return in about 6 weeks (around 06/05/2023). You can have the follow up appointment with Dr. Dellis Anes or a Nurse Practicioner (our Nurse Practitioners are excellent and always have Physician oversight!).    This note in its entirety was forwarded to the Provider who requested this consultation.  Subjective:   RONEN KAMMER is a 77 y.o. male presenting today for evaluation of  Chief Complaint  Patient presents with   Allergies   Allergic Reaction    Drug   Establish Care   Angioedema   Rash    KYE CHANDRASEKARAN has a history of the following: Patient Active Problem List   Diagnosis Date Noted   PAD (peripheral artery disease) (HCC) 11/19/2022   Acute on chronic systolic (congestive) heart failure (HCC) 10/29/2022   Pressure injury of skin 10/29/2022   Cellulitis  10/20/2022   Angina pectoris (HCC) 10/10/2022   CAD (coronary artery disease) 09/18/2022   CKD (chronic kidney disease) stage 3, GFR 30-59 ml/min (HCC) 09/18/2022   AKI (acute kidney injury) (HCC) 08/21/2021   Enteritis 08/21/2021   IPF (idiopathic pulmonary fibrosis) (HCC) 03/21/2021   Pain due to onychomycosis of toenails of both feet 08/10/2019   Chronic HFrEF (heart failure with reduced ejection fraction) (HCC) 06/08/2019   Biventricular ICD (implantable cardioverter-defibrillator) in place 06/08/2019   Esophageal obstruction due to food impaction    Esophageal stricture    Dysphagia 11/19/2017   Pancreatitis 11/10/2017   Tinnitus 05/23/2015   Advance care planning 10/20/2014   S/P cervical spinal fusion 06/09/2014   Stenosis of cervical spine with myelopathy (HCC) 12/27/2013   Central cord syndrome (HCC) 06/24/2013   Status post abdominal aortic aneurysm repair 12/30/2012   HLD (hyperlipidemia) 09/16/2012   SK (seborrheic keratosis) 02/12/2012   Ischemic cardiomyopathy    LBBB (left bundle branch block)    AAA (abdominal aortic aneurysm) (HCC) 10/08/2010   PVD (peripheral vascular disease) (HCC) 10/08/2010   ORGANIC IMPOTENCE 07/05/2010   Essential hypertension, benign 02/27/2010   Type 2 diabetes mellitus with hyperlipidemia (HCC) 11/16/2009    History obtained from: chart review and patient.  Cardiologist - Dr. Anne Fu  Discussed the use of AI scribe software for clinical note transcription with the patient and/or guardian, who gave verbal consent to proceed.  [name] was referred by [name], MD.  Obe is a 77 y.o. male presenting for an evaluation of a rash .    Cotter presents with a complex medical history including recent cardiac procedures and a suspected drug reaction. The patient underwent a heart procedure in May, during which Plavix was initiated. However, the patient developed a severe reaction to Plavix, characterized by hives and a  rash. The patient was subsequently switched to Brilinta, but again experienced a severe reaction similar to Plavix. The patient was then placed on Effient, which was tolerated initially but the patient suspects a delayed reaction to this medication as well.  The patient's skin reaction is described as starting with a rash, which then progresses to hives. The most severe reaction was noted with Plavix, which resulted in a rash resembling a severe sunburn with skin peeling off. The rash is primarily located on the legs, but also appears on the arms, stomach, and back. The patient reports that the rash is itchy and painful to touch. The patient has tried various topical treatments including Silvadene, Aloe, Fix Decoro, Mama Bear Magnesium Body Butter, and [place] lotion, with varying degrees of relief.  Magnesium Body Butter:   In addition to the suspected drug reaction, the patient has a history of cardiac issues, including the placement of three stents in June. The patient also has a pacemaker. The patient was started on Lasix for cardiac-related edema in late June or early July. The patient suspects that the Lasix may be contributing to the skin reaction. Lasix was started in June 2024.  He has not seen dermatology.  His last visit with cardiology was 1 day ago.  He was seen by Clinton Gallant, PA.  At that time, his ABIs and TBI's appeared unchanged.  Otherwise, it seems like a wait and see approach. Cardiovascular wise, everything seemed stable.   The patient also reports a significant decrease in exercise tolerance over the past 8-10 weeks, with increasing shortness of breath. The patient used to be able to work in the yard and go to the gym regularly, but now struggles with these activities due to breathlessness. The patient has not stopped any of the suspected medications, but reports that the skin reaction improved temporarily when Plavix and Brilinta were discontinued. He has no history of asthma  at all.  But he does have a history of idiopathic pulmonary fibrosis.  He follows with Dr. Marchelle Gearing in Pulmonology.  His last visit with him was in September 2024.  Dr. Marchelle Gearing felt that Ricki Rodriguez was a better choice to start.  Chest CT (September 2024) IMPRESSION: 1. 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 Pulmonary parenchymal pattern of fibrosis, as detailed above, stable from 03/27/2022 but progressive from 10/03/2020. Findings are consistent with UIP per consensus guidelines: Diagnosis of Idiopathic Pulmonary Fibrosis: An Official ATS/ERS/JRS/ALAT Clinical Practice Guideline. Am Rosezetta Schlatter Crit Care Med Vol 198, Iss 5, [phone], Jan 04 2017. 2. Aortic atherosclerosis (ICD10-I70.0). Coronary artery calcification  The patient has a history of diabetes, which is managed with metformin and Lantus. The patient also has a lung condition, which is managed by a pulmonologist. The patient's lung condition and diabetes have been stable. The patient denies any changes in these medications. The patient also denies any history of allergies prior to the suspected drug reactions. The patient is retired and lives with his spouse.     Otherwise, there is no history of other atopic diseases, including asthma, food allergies, drug allergies, environmental allergies, stinging insect allergies, or contact dermatitis. There is  no significant infectious history. Vaccinations are up to date.    Past Medical History: Patient Active Problem List   Diagnosis Date Noted   PAD (peripheral artery disease) (HCC) 11/19/2022   Acute on chronic systolic (congestive) heart failure (HCC) 10/29/2022   Pressure injury of skin 10/29/2022   Cellulitis 10/20/2022   Angina pectoris (HCC) 10/10/2022   CAD (coronary artery disease) 09/18/2022   CKD (chronic kidney disease) stage 3, GFR 30-59 ml/min (HCC) 09/18/2022   AKI (acute kidney injury) (HCC) 08/21/2021   Enteritis 08/21/2021   IPF (idiopathic  pulmonary fibrosis) (HCC) 03/21/2021   Pain due to onychomycosis of toenails of both feet 08/10/2019   Chronic HFrEF (heart failure with reduced ejection fraction) (HCC) 06/08/2019   Biventricular ICD (implantable cardioverter-defibrillator) in place 06/08/2019   Esophageal obstruction due to food impaction    Esophageal stricture    Dysphagia 11/19/2017   Pancreatitis 11/10/2017   Tinnitus 05/23/2015   Advance care planning 10/20/2014   S/P cervical spinal fusion 06/09/2014   Stenosis of cervical spine with myelopathy (HCC) 12/27/2013   Central cord syndrome (HCC) 06/24/2013   Status post abdominal aortic aneurysm repair 12/30/2012   HLD (hyperlipidemia) 09/16/2012   SK (seborrheic keratosis) 02/12/2012   Ischemic cardiomyopathy    LBBB (left bundle branch block)    AAA (abdominal aortic aneurysm) (HCC) 10/08/2010   PVD (peripheral vascular disease) (HCC) 10/08/2010   ORGANIC IMPOTENCE 07/05/2010   Essential hypertension, benign 02/27/2010   Type 2 diabetes mellitus with hyperlipidemia (HCC) 11/16/2009    Medication List:  Allergies as of 04/24/2023       Reactions   Aldactone [spironolactone] Other (See Comments)   Hyperkalemia   Brilinta [ticagrelor] Shortness Of Breath   Numbness in hands and feet Blurry vision   Clopidogrel Rash   Redness and Itchiness   Codeine Rash, Hives   Amoxicillin Hives   Atorvastatin    Myalgias with lipitor.  Does tolerate simvastatin.     Benadryl [diphenhydramine] Hives   Januvia [sitagliptin] Other (See Comments)   Diarrhea and heart racing   Jardiance [empagliflozin] Other (See Comments)   Polyuria; excessive weight loss   Lisinopril    Possible cause of pancreatitis   Rosuvastatin Other (See Comments)   myalgia   Lasix [furosemide] Rash        Medication List        Accurate as of April 24, 2023 11:59 PM. If you have any questions, ask your nurse or doctor.          Accu-Chek Aviva Plus test strip Generic drug:  glucose blood USE AS DIRECTED TO TEST BLOOD SUGAR TWICE DAILY   albuterol 108 (90 Base) MCG/ACT inhaler Commonly known as: VENTOLIN HFA INHALE 2 PUFFS INTO THE LUNGS EVERY 6 HOURS AS NEEDED FOR WHEEZING OR SHORTNESS OF BREATH   aspirin EC 81 MG tablet Take 1 tablet (81 mg total) by mouth daily.   carvedilol 25 MG tablet Commonly known as: COREG TAKE 1 TABLET BY MOUTH TWICE DAILY WITH MEALS   glipiZIDE 5 MG tablet Commonly known as: GLUCOTROL TAKE 1 TABLET(5 MG) BY MOUTH TWICE DAILY BEFORE A MEAL   Incruse Ellipta 62.5 MCG/ACT Aepb Generic drug: umeclidinium bromide Inhale 1 puff into the lungs daily.   Lantus SoloStar 100 UNIT/ML Solostar Pen Generic drug: insulin glargine INJECT 0.3 TO 0.35 MLS(30 TO 35 UNITS) INTO THE SKIN EVERY DAY   metFORMIN 500 MG tablet Commonly known as: GLUCOPHAGE TAKE 2 TABLETS BY MOUTH TWICE DAILY WITH  FOOD   multivitamin with minerals Tabs tablet Take 1 tablet by mouth in the morning.   omeprazole 40 MG capsule Commonly known as: PRILOSEC Take 40 mg by mouth daily.   prasugrel 10 MG Tabs tablet Commonly known as: Effient Take 1 tablet (10 mg total) by mouth daily.   predniSONE 10 MG tablet Commonly known as: DELTASONE Take one tablet (10mg ) twice daily ten days, then one tablet (10mg ) once daily for ten days, then STOP. Started by: Alfonse Spruce   simvastatin 20 MG tablet Commonly known as: ZOCOR Take 1 tablet (20 mg total) by mouth at bedtime.   SSD 1 % cream Generic drug: silver sulfADIAZINE APPLY TOPICALLY TO THE AFFECTED AREA DAILY   silver sulfADIAZINE 1 % cream Commonly known as: Silvadene Apply 1 Application topically daily. PCP to refill   torsemide 20 MG tablet Commonly known as: DEMADEX Take 1 tablet (20 mg total) by mouth daily.   triamcinolone ointment 0.1 % Commonly known as: KENALOG Apply 1 Application topically 2 (two) times daily. Started by: Alfonse Spruce   valsartan 40 MG tablet Commonly  known as: DIOVAN Take 1 tablet (40 mg total) by mouth daily with supper.   VITAMIN B-12 PO Take 2,000 mcg by mouth in the morning.   Vitamin D3 50 MCG (2000 UT) Tabs Take 2,000 Units by mouth in the morning.        Birth History: non-contributory  Developmental History: non-contributory  Past Surgical History: Past Surgical History:  Procedure Laterality Date   ABDOMINAL AORTAGRAM N/A 10/28/2011   Procedure: ABDOMINAL Ronny Flurry;  Surgeon: [name], MD;  Location: [facility] CATH LAB;  Service: Cardiovascular;  Laterality: N/A;   ABDOMINAL AORTIC ANEURYSM REPAIR     EVAR    ABDOMINAL AORTOGRAM N/A 05/27/2022   Procedure: ABDOMINAL AORTOGRAM;  Surgeon: Maeola Harman, MD;  Location: [facility] INVASIVE CV LAB;  Service: Cardiovascular;  Laterality: N/A;   BI-VENTRICULAR IMPLANTABLE CARDIOVERTER DEFIBRILLATOR N/A 07/16/2012   Procedure: BI-VENTRICULAR IMPLANTABLE CARDIOVERTER DEFIBRILLATOR  (CRT-D);  Surgeon: Duke Salvia, MD;  Location: [facility] CATH LAB;  Service: Cardiovascular;  Laterality: N/A;   BIV ICD GENERATOR CHANGEOUT N/A 10/25/2020   Procedure: BIV ICD GENERATOR CHANGEOUT;  Surgeon: Duke Salvia, MD;  Location: [facility] INVASIVE CV LAB;  Service: Cardiovascular;  Laterality: N/A;   CARDIAC CATHETERIZATION     CATARACT EXTRACTION Left 2023   COLONOSCOPY  03/23/2018; 2020   CORONARY CTO INTERVENTION N/A 10/10/2022   Procedure: CORONARY CTO INTERVENTION;  Surgeon: Corky Crafts, MD;  Location: [facility] INVASIVE CV LAB;  Service: Cardiovascular;  Laterality: N/A;   CORONARY LITHOTRIPSY N/A 09/18/2022   Procedure: CORONARY LITHOTRIPSY;  Surgeon: Corky Crafts, MD;  Location: [facility] INVASIVE CV LAB;  Service: Cardiovascular;  Laterality: N/A;   CORONARY STENT INTERVENTION N/A 09/18/2022   Procedure: CORONARY STENT INTERVENTION;  Surgeon: Corky Crafts, MD;  Location: [facility] INVASIVE CV LAB;  Service: Cardiovascular;  Laterality: N/A;   CORONARY ULTRASOUND/IVUS N/A  09/18/2022   Procedure: Coronary Ultrasound/IVUS;  Surgeon: Corky Crafts, MD;  Location: [facility] INVASIVE CV LAB;  Service: Cardiovascular;  Laterality: N/A;   ENDARTERECTOMY FEMORAL Right 11/19/2022   Procedure: RIGHT COMMON FEMORAL ENDARTERECTOMY WITH VEIN PATCH ANGIOPLASTY;  Surgeon: Maeola Harman, MD;  Location: [facility] OR;  Service: Vascular;  Laterality: Right;   ESOPHAGOGASTRODUODENOSCOPY N/A 02/12/2019   Procedure: ESOPHAGOGASTRODUODENOSCOPY (EGD);  Surgeon: Lynann Bologna, MD;  Location: [facility] ENDOSCOPY;  Service: Endoscopy;  Laterality: N/A;   ESOPHAGOGASTRODUODENOSCOPY (EGD) WITH PROPOFOL  N/A 02/05/2018   Procedure: ESOPHAGOGASTRODUODENOSCOPY (EGD) WITH PROPOFOL;  Surgeon: Napoleon Form, MD;  Location: [facility] ENDOSCOPY;  Service: Endoscopy;  Laterality: N/A;   ESOPHAGOGASTRODUODENOSCOPY (EGD) WITH PROPOFOL N/A 09/27/2018   Procedure: ESOPHAGOGASTRODUODENOSCOPY (EGD) WITH PROPOFOL;  Surgeon: Jeani Hawking, MD;  Location: [facility] ENDOSCOPY;  Service: Endoscopy;  Laterality: N/A;   FOREIGN BODY REMOVAL  09/27/2018   Procedure: FOREIGN BODY REMOVAL;  Surgeon: Jeani Hawking, MD;  Location: [facility] ENDOSCOPY;  Service: Endoscopy;;   FOREIGN BODY REMOVAL  02/12/2019   Procedure: FOREIGN BODY REMOVAL;  Surgeon: Lynann Bologna, MD;  Location: [facility] ENDOSCOPY;  Service: Endoscopy;;   HERNIA REPAIR  05/14/2013   INSERTION OF MESH N/A 05/14/2013   Procedure: INSERTION OF MESH;  Surgeon: Ardeth Sportsman, MD;  Location: [facility] OR;  Service: General;  Laterality: N/A;   LEFT HEART CATH N/A 10/10/2022   Procedure: Left Heart Cath;  Surgeon: Corky Crafts, MD;  Location: [facility] INVASIVE CV LAB;  Service: Cardiovascular;  Laterality: N/A;   LEFT HEART CATH AND CORONARY ANGIOGRAPHY N/A 07/17/2021   Procedure: LEFT HEART CATH AND CORONARY ANGIOGRAPHY;  Surgeon: Lennette Bihari, MD;  Location: [facility] INVASIVE CV LAB;  Service: Cardiovascular;  Laterality: N/A;   LEFT HEART CATH AND CORONARY ANGIOGRAPHY N/A 09/18/2022    Procedure: LEFT HEART CATH AND CORONARY ANGIOGRAPHY;  Surgeon: Corky Crafts, MD;  Location: [facility] INVASIVE CV LAB;  Service: Cardiovascular;  Laterality: N/A;   LOWER EXTREMITY ANGIOGRAM Bilateral 10/28/2011   Procedure: LOWER EXTREMITY ANGIOGRAM;  Surgeon: [name], MD;  Location: [facility] CATH LAB;  Service: Cardiovascular;  Laterality: Bilateral;   LOWER EXTREMITY ANGIOGRAM Right 11/19/2022   Procedure: RIGHT LOWER EXTREMITY ANGIOGRAM;  Surgeon: Maeola Harman, MD;  Location: [facility] OR;  Service: Vascular;  Laterality: Right;   LOWER EXTREMITY ANGIOGRAPHY  05/27/2022   Procedure: Lower Extremity Angiography;  Surgeon: Maeola Harman, MD;  Location: [facility] INVASIVE CV LAB;  Service: Cardiovascular;;   PACEMAKER INSERTION  07/16/2012   pacemaker/defibrilator   PATCH ANGIOPLASTY Right 11/19/2022   Procedure: VEIN PATCH ANGIOPLASTY;  Surgeon: Maeola Harman, MD;  Location: [facility] OR;  Service: Vascular;  Laterality: Right;   POLYPECTOMY     POSTERIOR CERVICAL FUSION/FORAMINOTOMY N/A 06/09/2014   Procedure: Laminectomy - Cervical two-Cervcial four posterior cervical instrumented fusion Cervical two-cervical four;  Surgeon: Tia Alert, MD;  Location: [facility] NEURO ORS;  Service: Neurosurgery;  Laterality: N/A;  posterior    SAVORY DILATION N/A 02/05/2018   Procedure: SAVORY DILATION;  Surgeon: Napoleon Form, MD;  Location: [facility] ENDOSCOPY;  Service: Endoscopy;  Laterality: N/A;   TONSILLECTOMY     as a child    UMBILICAL HERNIA REPAIR N/A 05/14/2013   Procedure: LAPAROSCOPIC exploration and repair of hernia in abdominal ;  Surgeon: Ardeth Sportsman, MD;  Location: [facility] OR;  Service: General;  Laterality: N/A;   UPPER GASTROINTESTINAL ENDOSCOPY  2020     Family History: Family History  Problem Relation Age of Onset   Hypertension Mother    Stroke Mother    Hyperlipidemia Mother    Lung cancer Father    Diabetes Sister    Heart disease Sister        Before  age 63   Hypertension Sister    Hyperlipidemia Sister    Heart attack Sister    Hypertension Son    Colon cancer Neg Hx    Prostate cancer Neg Hx    Esophageal cancer Neg Hx    Stomach cancer  Neg Hx    Rectal cancer Neg Hx    Colon polyps Neg Hx    Pancreatic cancer Neg Hx      Social History: Josafat lives at home with his wife.  They live in a house that is 29 years old.  There is hardwood throughout the home.  They have gas heating and central cooling.  There is a cat inside of the house including the bedroom.  There are dust mite covers on the bed and the pillows.  There is no tobacco exposure.  He is currently retired.  There is no fume, chemical, or dust exposure.  They do not have a HEPA filter in the home.  They do not live near an interstate or industrial area.   Review of systems otherwise negative other than that mentioned in the HPI.    Objective:   Blood pressure 128/64, pulse 84, temperature 97.9 F (36.6 C), temperature source Temporal, height 6' (1.829 m), weight 180 lb 4.8 oz (81.8 kg), SpO2 95%. Body mass index is 24.45 kg/m.     Physical Exam Vitals reviewed.  Constitutional:      Appearance: He is well-developed.     Comments: Very talkative.  HENT:     Head: Normocephalic and atraumatic.     Right Ear: Tympanic membrane, ear canal and external ear normal. No drainage, swelling or tenderness. Tympanic membrane is not injected, scarred, erythematous, retracted or bulging.     Left Ear: Tympanic membrane, ear canal and external ear normal. No drainage, swelling or tenderness. Tympanic membrane is not injected, scarred, erythematous, retracted or bulging.     Nose: No nasal deformity, septal deviation, mucosal edema or rhinorrhea.     Right Turbinates: Enlarged, swollen and pale.     Left Turbinates: Enlarged, swollen and pale.     Right Sinus: No maxillary sinus tenderness or frontal sinus tenderness.     Left Sinus: No maxillary sinus tenderness or  frontal sinus tenderness.     Mouth/Throat:     Mouth: Mucous membranes are not pale and not dry.     Pharynx: Uvula midline.  Eyes:     General:        Right eye: No discharge.        Left eye: No discharge.     Conjunctiva/sclera: Conjunctivae normal.     Right eye: Right conjunctiva is not injected. No chemosis.    Left eye: Left conjunctiva is not injected. No chemosis.    Pupils: Pupils are equal, round, and reactive to light.  Cardiovascular:     Rate and Rhythm: Normal rate and regular rhythm.     Heart sounds: Normal heart sounds.  Pulmonary:     Effort: Pulmonary effort is normal. No tachypnea, accessory muscle usage or respiratory distress.     Breath sounds: Normal breath sounds. No wheezing, rhonchi or rales.  Chest:     Chest wall: No tenderness.  Abdominal:     Tenderness: There is no abdominal tenderness. There is no guarding or rebound.  Lymphadenopathy:     Head:     Right side of head: No submandibular, tonsillar or occipital adenopathy.     Left side of head: No submandibular, tonsillar or occipital adenopathy.     Cervical: No cervical adenopathy.  Skin:    General: Skin is warm.     Capillary Refill: Capillary refill takes less than 2 seconds.     Coloration: Skin is not pale.     Findings: No  abrasion, erythema, petechiae or rash. Rash is not papular, urticarial or vesicular.     Comments: Peeling skin on the right lower extremity.  Picture shown below.  There is some wheezing appreciated. He denies pain and does report more itching.   Neurological:     Mental Status: He is alert.         Diagnostic studies: labs sent instead          Malachi Bonds, MD [facility]

## 2023-04-24 NOTE — Progress Notes (Signed)
I'll work on the hard copy.  Thanks.  

## 2023-04-24 NOTE — Patient Instructions (Addendum)
1. Peeling skin with likely adverse drug reaction - We are going to get some labs to look for weird autoimmune causes of the rash: - Antiskin Autoantibodies, Quant - CBC with Differential/Platelet - Allergens w/Comp Rflx Area 2 - CMP14+EGFR - Sedimentation rate - ANA, IFA (with reflex) - C-reactive protein - Tryptase - Definitely restart the Silvadene twice daily. - Add on triamcinolone ointment twice daily. - Start low dose prednisone taper: Take one tablet (10mg ) twice daily ten days, then one tablet (10mg ) once daily for ten days, then STOP.  - We may consider doing patch testing in the future to see if you are reacting to one of the over the counter ointments or something else in your environment. - But I want to get the rash under control first.  2. Return in about 6 weeks (around 06/05/2023). You can have the follow up appointment with Dr. Dellis Anes or a Nurse Practicioner (our Nurse Practitioners are excellent and always have Physician oversight!).    Please inform us of any Emergency Department visits, hospitalizations, or changes in symptoms. Call us before going to the ED for breathing or allergy symptoms since we might be able to fit you in for a sick visit. Feel free to contact us anytime with any questions, problems, or concerns.  It was a pleasure to meet you today!  Websites that have reliable patient information: 1. American Academy of Asthma, Allergy, and Immunology: www.aaaai.org 2. Food Allergy Research and Education (FARE): foodallergy.org 3. Mothers of Asthmatics: http://www.asthmacommunitynetwork.org 4. American College of Allergy, Asthma, and Immunology: www.acaai.org      "Like" [place] on Facebook and Instagram for our latest updates!      A healthy democracy works best when [employer] participate! Make sure you are registered to vote! If you have moved or changed any of your contact information, you will need to get this updated before voting! Scan the QR  codes below to learn more!

## 2023-04-25 ENCOUNTER — Ambulatory Visit: Payer: Medicare Other | Admitting: Internal Medicine

## 2023-04-25 ENCOUNTER — Ambulatory Visit: Payer: Self-pay

## 2023-04-25 DIAGNOSIS — J84112 Idiopathic pulmonary fibrosis: Secondary | ICD-10-CM

## 2023-04-25 LAB — PULMONARY FUNCTION TEST
DL/VA % pred: 45 %
DL/VA: 1.77 ml/min/mmHg/L
DLCO cor % pred: 30 %
DLCO cor: 8.22 ml/min/mmHg
DLCO unc % pred: 30 %
DLCO unc: 8.22 ml/min/mmHg
FEF 25-75 Pre: 1.35 L/s
FEF2575-%Pred-Pre: 57 %
FEV1-%Pred-Pre: 72 %
FEV1-Pre: 2.39 L
FEV1FVC-%Pred-Pre: 93 %
FEV6-%Pred-Pre: 82 %
FEV6-Pre: 3.53 L
FEV6FVC-%Pred-Pre: 106 %
FVC-%Pred-Pre: 77 %
FVC-Pre: 3.53 L
Pre FEV1/FVC ratio: 68 %
Pre FEV6/FVC Ratio: 100 %

## 2023-04-25 NOTE — Patient Instructions (Signed)
Spirometry/DLCO performed today. 

## 2023-04-25 NOTE — Patient Instructions (Signed)
Visit Information  Thank you for taking time to visit with me today. Please don't hesitate to contact me if I can be of assistance to you.   Following are the goals we discussed today:   Goals Addressed             This Visit's Progress    Continued improvement post surgery and management of health conditions       Interventions Today    Flowsheet Row Most Recent Value  Chronic Disease   Chronic disease during today's visit Other, Diabetes  [pulmonary fibrosis, right leg/ chin edema/ weeping.]  General Interventions   General Interventions Discussed/Reviewed General Interventions Reviewed, Doctor Visits, Labs  [evaluation of current treatment plan for mentioned health conditions and patients adherence to plan as established by provider. Assessed for increase breathing symptoms.]  Labs Hgb A1c every 6 months  [discussed most recent Hgb A1c of 7.9 and goal]  Doctor Visits Discussed/Reviewed Doctor Visits Reviewed  Annabell Sabal upcoming provider visits. Advised to keep follow up appointments with providers.]  Education Interventions   Education Provided Provided Education  [Advised to wear oxygen as instructed.  Advised to take frequent rest breaks when doing more energy exerting activities. Advised to  weight daily and record, eat low salt diet and limit fluid intake to < 2L. Discussed recent heart failure clinic visit.]  Nutrition Interventions   Nutrition Discussed/Reviewed Nutrition Reviewed, Decreasing salt  Pharmacy Interventions   Pharmacy Dicussed/Reviewed Pharmacy Topics Reviewed  [name] to take medications as prescribed.  Discused recent medication adjustments.]              Our next appointment is by telephone on 06/06/23 at 11 am  Please call the care guide team at [phone] if you need to cancel or reschedule your appointment.   If you are experiencing a Mental Health or Behavioral Health Crisis or need someone to talk to, please call the Suicide and Crisis  Lifeline: 988 call 1-800-273-TALK (toll free, 24 hour hotline)  Patient verbalizes understanding of instructions and care plan provided today and agrees to view in MyChart. Active MyChart status and patient understanding of how to access instructions and care plan via MyChart confirmed with patient.     George Ina RN,BSN,CCM [place]  [facility], [facility] coordinator / Case Manager Phone: [phone]

## 2023-04-25 NOTE — Progress Notes (Signed)
Spirometry/DLCO performed today. 

## 2023-04-25 NOTE — Patient Outreach (Signed)
  Care Coordination   Follow Up Visit Note   04/25/2023 Name: Luke [name] MRN: [id number] DOB: 21-Nov-1945  Luke [name] is a 77 y.o. year old male who sees [name], MD for primary care. I spoke with  Luke [name] by phone today.  What matters to the patients health and wellness today?  Patient states he is doing well. He reports having follow up with the podiatrist and allergist this week. He states the allergist thinks he is having a drug interaction allergic response on his right leg. He states this could be causing his leg/ skin issue and SOB.  He states he had labs drawn this week and expects results back in a week. Patient states allergist prescribed prednisone that he will start today and he will continue to use the silvadene cream.  Patient reports next follow up visit with allergist is 06/05/23. Patient reports current blood sugars fasting have been 93, 105, 107.  Patient denies any increase in HF symptoms.    Goals Addressed             This Visit's Progress    Continued improvement post surgery and management of health conditions       Interventions Today    Flowsheet Row Most Recent Value  Chronic Disease   Chronic disease during today's visit Congestive Heart Failure (CHF), Other  [right leg/ chin weeping/ redness/ edema]  General Interventions   General Interventions Discussed/Reviewed General Interventions Reviewed, Doctor Visits  [Evaluation of current treatment plan on mentioned health conditions and patients adherence to plan as established by provider.]  Doctor Visits Discussed/Reviewed Doctor Visits Reviewed  [facility] upcoming provider visits. Advised to keep follow up appointments with provider as recommended.]  Education Interventions   Education Provided Provided Education  [Discussed ongoing application of silvadene cream to rt leg/ elevation. Reviewed signs/ of HF. Advised ongoing monitoring of weight daily and recording.  Advised ongoing self  monitoring/ management of HF symptoms. Notify provider for increase in symptoms]  Provided Verbal Education On Blood Sugar Monitoring  [Assessed  BS readings.]  Nutrition Interventions   Nutrition Discussed/Reviewed Nutrition Reviewed, Decreasing salt  Pharmacy Interventions   Pharmacy Dicussed/Reviewed Pharmacy Topics Reviewed  [reviewed medications and discussed compliance. Discussed effects of prednisone on blood sugars.]              SDOH assessments and interventions completed:  No     Care Coordination Interventions:  Yes, provided   Follow up plan: Follow up call scheduled for 06/06/23    Encounter Outcome:  Patient Visit Completed   George Ina RN,BSN,CCM [facility]  [facility], [facility] coordinator / Case Manager Phone: [phone]

## 2023-04-27 LAB — CBC WITH DIFFERENTIAL/PLATELET
Basophils Absolute: 0.1 10*3/uL (ref 0.0–0.2)
Basos: 1 %
EOS (ABSOLUTE): 0.2 10*3/uL (ref 0.0–0.4)
Eos: 2 %
Hematocrit: 38.1 % (ref 37.5–51.0)
Hemoglobin: 12.5 g/dL — ABNORMAL LOW (ref 13.0–17.7)
Immature Grans (Abs): 0.1 10*3/uL (ref 0.0–0.1)
Immature Granulocytes: 1 %
Lymphocytes Absolute: 1.3 10*3/uL (ref 0.7–3.1)
Lymphs: 12 %
MCH: 32.6 pg (ref 26.6–33.0)
MCHC: 32.8 g/dL (ref 31.5–35.7)
MCV: 99 fL — ABNORMAL HIGH (ref 79–97)
Monocytes Absolute: 0.7 10*3/uL (ref 0.1–0.9)
Monocytes: 7 %
Neutrophils Absolute: 8.4 10*3/uL — ABNORMAL HIGH (ref 1.4–7.0)
Neutrophils: 77 %
Platelets: 334 10*3/uL (ref 150–450)
RBC: 3.84 x10E6/uL — ABNORMAL LOW (ref 4.14–5.80)
RDW: 12.4 % (ref 11.6–15.4)
WBC: 10.8 10*3/uL (ref 3.4–10.8)

## 2023-04-27 LAB — ALLERGENS W/COMP RFLX AREA 2: IgE (Immunoglobulin E), Serum: 47 [IU]/mL (ref 6–495)

## 2023-04-27 LAB — CMP14+EGFR
ALT: 21 [IU]/L (ref 0–44)
AST: 14 [IU]/L (ref 0–40)
Albumin: 4 g/dL (ref 3.8–4.8)
Alkaline Phosphatase: 88 [IU]/L (ref 44–121)
BUN/Creatinine Ratio: 28 — ABNORMAL HIGH (ref 10–24)
BUN: 37 mg/dL — ABNORMAL HIGH (ref 8–27)
Bilirubin Total: 0.3 mg/dL (ref 0.0–1.2)
CO2: 22 mmol/L (ref 20–29)
Calcium: 9.9 mg/dL (ref 8.6–10.2)
Chloride: 99 mmol/L (ref 96–106)
Creatinine, Ser: 1.33 mg/dL — ABNORMAL HIGH (ref 0.76–1.27)
Globulin, Total: 2.9 g/dL (ref 1.5–4.5)
Glucose: 225 mg/dL — ABNORMAL HIGH (ref 70–99)
Potassium: 5.5 mmol/L — ABNORMAL HIGH (ref 3.5–5.2)
Sodium: 139 mmol/L (ref 134–144)
Total Protein: 6.9 g/dL (ref 6.0–8.5)
eGFR: 55 mL/min/{1.73_m2} — ABNORMAL LOW (ref >59)

## 2023-04-27 LAB — TRYPTASE: Tryptase: 7.1 ug/L (ref 2.2–13.2)

## 2023-04-27 LAB — ANTISKIN AUTOANTIBODIES, QUANT

## 2023-04-27 LAB — SEDIMENTATION RATE: Sed Rate: 18 mm/h (ref 0–30)

## 2023-04-27 LAB — C-REACTIVE PROTEIN: CRP: 9 mg/L (ref 0–10)

## 2023-04-27 LAB — ANTINUCLEAR ANTIBODIES, IFA

## 2023-04-28 ENCOUNTER — Encounter: Payer: Self-pay | Admitting: Allergy & Immunology

## 2023-04-28 ENCOUNTER — Ambulatory Visit: Payer: Medicare Other | Attending: Internal Medicine

## 2023-04-28 DIAGNOSIS — I5042 Chronic combined systolic (congestive) and diastolic (congestive) heart failure: Secondary | ICD-10-CM

## 2023-04-28 DIAGNOSIS — Z95810 Presence of automatic (implantable) cardiac defibrillator: Secondary | ICD-10-CM

## 2023-04-28 NOTE — Progress Notes (Signed)
Signed and faxed on 04/28/23

## 2023-04-29 ENCOUNTER — Encounter: Payer: Self-pay | Admitting: Allergy & Immunology

## 2023-04-29 ENCOUNTER — Other Ambulatory Visit: Payer: Self-pay | Admitting: Family Medicine

## 2023-05-01 NOTE — Progress Notes (Signed)
EPIC Encounter for ICM Monitoring  Patient Name: Luke [name] is a 77 y.o. male Date: 05/01/2023 Primary Care Physican: [name], MD Primary Cardiologist: Skains/Sabharwal Electrophysiologist: Joycelyn Schmid Pacing: >99%          11/12/2022 Weight: 170 lbs (baseline 174 lbs) 01/01/2023 Weight: 170.4 01/02/2023 Weight: 172.6 lbs 03/11/2023 Weight: 175 lbs 03/17/2023 Weight: 173.8 lbs 03/18/2023 Weight: 176.2 lbs 03/24/2023 Weight: 173.6 lbs 04/15/2023 Weight: 174.2 lbs 05/01/2023 Weight: 170 lbs                                                            Spoke with patient and heart failure questions reviewed.  Transmission results reviewed.  Pt reports he is doing well and has no fluid symptoms.  He is taking Prednisone and cream to help with skin problems on his legs.  The leg weeping has stopped.  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      CorVue thoracic impedance suggesting fluid levels returned to normal 12/22.   Prescribed:  Torsemide 20 mg take 1 tablet(s) (20 mg total) by mouth daily.   Labs: 01/09/2023 Creatinine 1.24, BUN 28, Potassium 4.6, Sodium 135, GFR 56.58 11/20/2022 Creatinine 1.17, BUN 23, Potassium 3.9, Sodium 135, GFR >60 11/08/2022 Creatinine 1.40, BUN 31, Potassium 5.0, Sodium 137  11/01/2022 Creatinine 1.16, BUN 23, Potassium 3.8, Sodium 139, GFR >60  10/31/2022 Creatinine 1.33, BUN 23, Potassium 3.3, Sodium 137, GFR 54  10/30/2022 Creatinine 1.50, BUN 24, Potassium 4.4, Sodium 41, GFR >60 A complete set of results can be found in Results Review.   Recommendations:  No changes and encouraged to call if experiencing any fluid symptoms.  Encouraged to contact Dr Gasper Lloyd via Ear[facility] Reaper to ask to meet with pharmacist regarding his meds since allergist thinks he may be allergic to Torsemide.   Follow-up plan: ICM clinic phone  appointment on 05/18/2022.   91 day device clinic remote transmission 07/23/2023.     EP/Cardiology Office Visits:  Recall 12/12/2023 with Dr. Graciela Husbands  Recall 01/18/2024 with Dr Anne Fu.     Copy of ICM check sent to Dr. Graciela Husbands.     3 month ICM trend: 04/28/2023.    12-14 Month ICM trend:     Karie Soda, RN 05/01/2023 4:01 PM

## 2023-05-02 ENCOUNTER — Other Ambulatory Visit: Payer: Self-pay

## 2023-05-02 DIAGNOSIS — I739 Peripheral vascular disease, unspecified: Secondary | ICD-10-CM

## 2023-05-12 NOTE — Progress Notes (Signed)
 OV 01/02/2021  Subjective:  Patient ID: Luke [name], male , DOB: Jul 29, 1945 , age 78 y.o. , MRN: [id number] , ADDRESS: [address] [place] [address] PCP Cleatus Arlyss RAMAN, MD Patient Care Team: Cleatus Arlyss RAMAN, MD as PCP - General Eliza Lonni RAMAN, MD (Vascular Surgery) Burnette Elsie, MD (Gastroenterology) Sheldon Standing, MD (General Surgery) Joshua Alm RAMAN, MD (Neurosurgery) Abigail Maude POUR (Optometry) Jeffrie Oneil BROCKS, MD (Cardiology) Myra Browning, [facility] as Pharmacist (Pharmacist)  This Provider for this visit: Treatment Team:  Attending Provider: Geronimo Amel, MD    01/02/2021 -transfer of care to the interstitial lung disease center for Dr. Geronimo.  Referral from Dr. Adine Gift Chief Complaint  Patient presents with   Consult    ILD consult per Dr. Gift. Pt states he has been doing okay since last visit and denies any complaints.     HPI Luke [name] 78 y.o. -referred by Dr. Adine Gift for interstitial lung disease evaluation   [facility] Comprehensive ILD Questionnaire  Symptoms:  -Reports insidious onset of shortness of breath gradually.  It is the same since it started.  He is unclear how long he is headed but he had it for a few years.  There is no cough at all.  There is no clearing of the throat.  There is no fatigue.  His appetite is good no nausea no vomiting no diarrhea no anxiety no depression no chronic pain    Past Medical History :   In 2009 he was diagnosed with systolic heart failure.  He believes etiology is ischemic based on a remote heart attack.  Has a history of diabetes for several years.  Has kidney disease not otherwise specified for several years.  His GFR in May 2022 was greater than 60 with a creatinine 1.23 mg percent.   His last echocardiogram was in 2019.  He sees cardiology Dr. Fernande and Dr. Oneil Jeffrie.  Overall he is deemed to be stable.  He had pacemaker check recently.  Denies any  collagen vascular disease or vasculitis sleep apnea.  Denies any PE.  He has had COVID-vaccine but has not had COVID. ROS:   -Positive for fatigue.  Has some dysphagia for the last few days but otherwise okay.  No nausea no vomiting no heartburn no snoring no rash no ulcers.   FAMILY HISTORY of LUNG DISEASE: Denies   EXPOSURE HISTORY: Smokes cigarettes 20 1966 in 2012 30 cigarettes/day.  He did have some passive smoking.  Did smoke marijuana between 1968 and 1970.  Once a month.  No cocaine use no intravenous drug use   HOME and HOBBY DETAILS : Single-family [place] for the last 25 years the age of the home is 75 years.  There is no dampness.  No mildew no mold.  No humidifier use no CPAP use no nebulizer use.  No steam iron use no Jacuzzi use.  No misting [place] no pet birds or parakeets no pet gerbils.  No mold in the [facility] duct.  Last checked in 2009.  Does do gardening.  No birds at this no strong mats no water  damage no Jacuzzi.   OCCUPATIONAL HISTORY (122 questions) : He was in [place] and got exposed to agent orange but otherwise detailed questioning is negative other than the fact he worked with some petroleum based cleaning agents.   PULMONARY TOXICITY HISTORY (27 items): He was on hydralazine  in 2019.   HRCT May  2022 - personally visualzied -definite progression is combined emphysema with UIP features.  IMPRESSION: 1. Spectrum of findings compatible with basilar predominant fibrotic interstitial lung disease without frank honeycombing, with mild progression at the lung bases since 03/25/2018 CT abdomen study, with more clear progression since 10/07/2011 CT abdomen study. Findings are categorized as probable UIP per consensus guidelines: Diagnosis of Idiopathic Pulmonary Fibrosis: An Official ATS/ERS/JRS/ALAT Clinical Practice Guideline. Am JINNY Honey Crit Care Med Vol 198, Iss 5, [id number], Jan 04 2017. 2. Scattered small solid pulmonary nodules, largest 5 mm.  Follow-up noncontrast chest CT recommended in 12 months. This recommendation follows the consensus statement: Guidelines for Management of Incidental Pulmonary Nodules Detected on CT Images: From the Fleischner Society 2017; Radiology 2017; 284:228-243. 3. Three-vessel coronary atherosclerosis. 4. Chronic atrophy of pancreatic body and tail, progressive since 2019 CT abdomen study, without discrete mass on this noncontrast CT, indeterminate but presumably due to progressive chronic pancreatitis. 5. Aortic Atherosclerosis (ICD10-I70.0) and Emphysema (ICD10-J43.9).     Electronically Signed   By: Luke DELENA Blue M.D.   On: 10/03/2020 16:53  SErology  Results for EMBRY, HUSS (MRN [id number]) as of 01/02/2021 14:44  Ref. Range 11/13/2020 16:09  Anti Nuclear Antibody (ANA) Latest Ref Range: NEGATIVE  NEGATIVE  Angiotensin-Converting Enzyme Latest Ref Range: 9 - 67 U/L 27  Cyclic Citrullin Peptide Ab Latest Units: UNITS <16  ds DNA Ab Latest Units: IU/mL <1  Myeloperoxidase Abs Latest Units: AI <1.0  Serine Protease 3 Latest Units: AI <1.0  RA Latex Turbid. Latest Ref Range: <14 IU/mL <14  SSA (Ro) (ENA) Antibody, IgG Latest Ref Range: <1.0 NEG AI <1.0 NEG  SSB (La) (ENA) Antibody, IgG Latest Ref Range: <1.0 NEG AI <1.0 NEG  Scleroderma (Scl-70) (ENA) Antibody, IgG Latest Ref Range: <1.0 NEG AI <1.0 NEG    eCHO arch 2019  Study Conclusions   - Left ventricle: The cavity size was normal. Wall thickness was    normal. Systolic function was normal. The estimated ejection    fraction was in the range of 55% to 60%. Wall motion was normal;    there were no regional wall motion abnormalities. Doppler    parameters are consistent with abnormal left ventricular    relaxation (grade 1 diastolic dysfunction).  - Mitral valve: There was mild regurgitation.  - Pulmonary arteries: Systolic pressure was moderately increased.    PA peak pressure: 40 mm Hg (S).     OV 02/14/2021 -  telephine visit  Subjective:  Patient ID: Luke [name], male , DOB: 02-01-1946 , age 67 y.o. , MRN: [id number] , ADDRESS: [address] [place] [address] PCP Cleatus Arlyss RAMAN, MD Patient Care Team: Cleatus Arlyss RAMAN, MD as PCP - General Eliza Lonni RAMAN, MD (Vascular Surgery) Burnette Elsie, MD (Gastroenterology) Sheldon Standing, MD (General Surgery) Joshua Alm RAMAN, MD (Neurosurgery) Abigail Maude POUR [facility]) Jeffrie Oneil BROCKS, MD (Cardiology) Myra Browning, [facility] as Pharmacist (Pharmacist)   Type of visit: Telephone/Video Circumstance: COVID-19 national emergency Identification of patient GERTRUDE TARBET with [birth year] and MRN [id number] - 2 person identifier Risks: Risks, benefits, limitations of telephone visit explained. Patient understood and verbalized agreement to proceed Anyone else on call: just patient Patient location: [phone] -  This provider location: [address], [place]  This Provider for this visit: Treatment Team:  Attending Provider: Geronimo Amel, MD    02/14/2021 -  FU IPF with some emphysema. Has sCHF  HPI JAZEN SPRAGGINS 78 y.o. -telephone  visit to see how his esbriet  started is going butt turns out he says he never got referred to pharmacist. Pharmacist confirmed that she is yet to meet iwht him. No esbriet  started. Says even spirivan not started. Also, supposed to be telephone visit but got confusing call and he showed up at front desk only to be sent home. Apologized for gap in customer service. Overall well. He said after last vsit he read more about esbriet  and got apprehensive esp in light of CKD but lab review shows GFR > 60 this year. 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  Instructions   - Slowly increase the dose per protocol  -Always take it with food  -Any nausea you can try ginger capsules  -Give at least between 5 and 6  hours between dosing  -Good idea to participate in manufacturer [place] sponsored medication support program - this is optional  -Definitely apply sunscreen when you go out with this medication  - You will need monthly liver tests for 6 months and thereafter every 3 months  - explained in decreaesd renal functio that side ffects could be higher but his GFR is good enough for full dose  - explained benefit of slowing fibrosis progression and that NNT of 1 to 6 is pretty good   We resolved we will jsu start and titrate up to max dose of 2 pills tid which is 2/3rd normal dose   CT Chest data  No results found.  Results for VOLLIE, BRUNTY (MRN [id number]) as of 02/14/2021 10:42  Ref. Range 10/23/2020 10:57  Creatinine Latest Ref Range: 0.76 - 1.27 mg/dL 8.82  Results for JOEANTHONY, SEELING (MRN [id number]) as of 02/14/2021 10:42  Ref. Range 01/02/2021 15:40  AST Latest Ref Range: 0 - 37 U/L 17  ALT Latest Ref Range: 0 - 53 U/L 16      OV 05/10/2021  Subjective:  Patient ID: Luke [name], male , DOB: January 19, 1946 , age 28 y.o. , MRN: [id number] , ADDRESS: [address] [place] [address] PCP Cleatus Arlyss RAMAN, MD Patient Care Team: Cleatus Arlyss RAMAN, MD as PCP - General Eliza Lonni RAMAN, MD (Vascular Surgery) Burnette Elsie, MD (Gastroenterology) Sheldon Standing, MD (General Surgery) Joshua Alm RAMAN, MD (Neurosurgery) Abigail Maude POUR (Optometry) Jeffrie Oneil BROCKS, MD (Cardiology) Myra Browning, [facility] as Pharmacist (Pharmacist)  This Provider for this visit: Treatment Team:  Attending Provider: Geronimo Amel, MD    05/10/2021 -   Chief Complaint  Patient presents with   Follow-up    PFT performed today.  Pt states he has been doing okay since last visit and denies any complaints.   02/14/2021 -  FU IPF with some emphysema. Has sCHF = - esbriet  shipment at his home 03/23/21  - on esbiret 2 pills tid due to CKD hx (ofev 2nd choice due to his heart  issues)     HPI Luke [name] 78 y.o. -presents for follow-up.  Since his last visit he started pirfenidone .  After starting pirfenidone  he developed COVID-19.  In the midst of all this he had to hold his COVID.  He also had diarrhea.  It was not fully clear in November 2022 whether the diarrhea was from COVID or from pirfenidone .  Nevertheless sometime around Thanksgiving he restarted pirfenidone .  He slowly escalated himself within a week to 2 pills 3 times daily.  He is  by choice at 2 pills 3 times daily because of a history of chronic kidney disease although most recent creatinine and GFR were greater than 60.  He says currently is doing 2 pills 3 times daily [submaximal but still therapeutic effect] and is tolerating this well without any GI side effects.  He does have chronic dry skin with flaking.  He says it is refractory.  We discussed about trying flaxseed.  He will do that.  We reviewed his first pulmonary function test.  Between the [facility] and DLCO when averaged he has 60% lung capacity.  He is doing Spiriva  but is not sure it is helping him.  His most recent liver function test was 1 month ago.  He will have another 1 today.        OV 08/08/2021  Subjective:  Patient ID: Luke [name], male , DOB: 1945-07-24 , age 89 y.o. , MRN: [id number] , ADDRESS: [address] [place] [address] PCP Cleatus Arlyss RAMAN, MD Patient Care Team: Cleatus Arlyss RAMAN, MD as PCP - General Eliza Lonni RAMAN, MD (Vascular Surgery) Burnette Elsie, MD (Gastroenterology) Sheldon Standing, MD (General Surgery) Joshua Alm RAMAN, MD (Neurosurgery) Abigail Maude POUR (Optometry) Jeffrie Oneil BROCKS, MD (Cardiology) Myra Browning, [facility] as Pharmacist (Pharmacist)  This Provider for this visit: Treatment Team:  Attending Provider: Geronimo Amel, MD    08/08/2021 -   Chief Complaint  Patient presents with   Follow-up    3 mo f/u for IPF. Stopped the Esbriet  due to increased dizziness combined with the  isosorbide . Will resume in a few weeks.    02/14/2021 -  FU IPF with some emphysema. Ha that particular study the current s sCHF = - esbriet  shipment at his home 03/23/21  - on esbiret 2 pills tid due to CKD hx (ofev 2nd choice due to his heart issues)  HPI Luke [name] 78 y.o. -returns for follow-up.  At this visit the original plan was to increase his pirfenidone  but he tells me that while doing his daily exercises he started noticing chest pain on the treadmill.  This followed eye surgery in January 2023.  He had cardiac cath mid March 2023 and he had 100% blockage in one of his vessels.  His isosorbide  was then increased.  He started feeling dizzy.  He attributed part of the dizziness due to pirfenidone .  He says pirfenidone  was already giving him dizziness and this was made worse by the isosorbide .  He then decided to take his isosorbide  half pill twice daily which helped his dizziness.  But he still was dizzy so he stopped his pirfenidone  and the dizziness improved a lot.  He decided to protect his heart over his lung.  He has upcoming visit with cardiology in Sep 11, 2021.  Till then he wants to be on pirfenidone  holiday.  After that he is definitely intending to restart the pirfenidone .  We discussed nintedanib and because of potential cardiac risk he does not want to do it.  We discussed clinical trials he is interested but he wants to rechallenge himself with pirfenidone  first.  Currently he is walking 2 miles per day on a treadmill at 4 miles an hour without an incline and it takes 30 minutes.  Current symptom score is below.  Labs look okay .  The chest pain is improved  Regarding his dry skin: This is better after flaxseed.  He asked for some advice about reversing atherosclerosis.  I referred him to the [facility]  Ornish diet.  He understands this means strict vegetarian/vegan diet.  He admits readily that he is a carnivore.  In the last meat-based diet.   OV 10/03/2021  Subjective:   Patient ID: Luke [name], male , DOB: 1945/12/29 , age 26 y.o. , MRN: [id number] , ADDRESS: [address] [place] [address] PCP Cleatus Arlyss RAMAN, MD Patient Care Team: Cleatus Arlyss RAMAN, MD as PCP - General Jeffrie Oneil BROCKS, MD as PCP - Cardiology (Cardiology) Eliza Lonni RAMAN, MD (Vascular Surgery) Burnette Elsie, MD (Gastroenterology) Sheldon Standing, MD (General Surgery) Joshua Alm RAMAN, MD (Neurosurgery) Abigail Maude POUR (Optometry) Jeffrie Oneil BROCKS, MD (Cardiology) Myra Browning, [facility] as Pharmacist (Pharmacist)  This Provider for this visit: Treatment Team:  Attending Provider: Geronimo Amel, MD  02/14/2021 -  FU IPF with some emphysema. Has sCHF = - esbriet  shipment at his home 03/23/21  - on esbiret 2 pills tid due to CKD hx (ofev kept as 2nd choice due to his heart issues)  -  esbriet  on hold since mid march 2023 due to dizziness made by worse by nitrate and improved after stopping esbriet   - restart late may 2023  10/03/2021 -   Chief Complaint  Patient presents with   Follow-up    Pt states he has been doing okay since last visit and denies any real complaints.     HPI Luke [name] 78 y.o. -returns for follow-up.  He says he is doing well.  The symptoms are minimal.  He continues to exercise.  He says he has difficulty tolerating medication adds.  He has not been stable on the isosorbide  which gave him dizziness in the setting of pirfenidone .  He stopped the pirfenidone  although he attributed the dizziness to the isosorbide .  After stopping pirfenidone  his dizziness went away.  Now for the last 20 days he is also been on valsartan .  He believes it was a new start.  For the last 1 week he is now back on pirfenidone  at 1 pill 3 times daily.  There are no side effects right now.  He is trying to go to 2 pills 3 times daily [the maximum dose and account of his CKD].  He is worried that he can get side effects such as fogginess and headaches.  But he is willing  to take it and monitor.  He is interested in clinical trials but at this point in time his is.  Is not on a stable dose.  His symptom score is stable His pulmonary function test shows stability/improvement in FVC.  Last CT scan of the chest May 2022     OV 01/10/2022  Subjective:  Patient ID: Luke [name], male , DOB: Jun 05, 1945 , age 46 y.o. , MRN: [id number] , ADDRESS: [address] [place] [address] PCP Cleatus Arlyss RAMAN, MD Patient Care Team: Cleatus Arlyss RAMAN, MD as PCP - General Jeffrie Oneil BROCKS, MD as PCP - Cardiology (Cardiology) Eliza Lonni RAMAN, MD (Vascular Surgery) Burnette Elsie, MD (Gastroenterology) Sheldon Standing, MD (General Surgery) Joshua Alm RAMAN, MD (Neurosurgery) Abigail Maude POUR (Optometry) Jeffrie Oneil BROCKS, MD (Cardiology) Fate Morna SAILOR, [facility] as Pharmacist (Pharmacist)  This Provider for this visit: Treatment Team:  Attending Provider: Geronimo Amel, MD    01/10/2022 -   Chief Complaint  Patient presents with   Follow-up    PFT performed today.  Pt states he has been dong okay since last visit and denies any complaints.   HPI [place] Steidle Amison 78  y.o. -returns for follow-up.  He continues to do well.  He is back on pirfenidone  at 2 pills 3 times daily.  He does not want to escalate this further because of dizziness that he feels is because of valsartan  and nitrates.  He has adjusted his valsartan  and nitrate regimen but he still has dizziness.  With the pirfenidone  he feels goofy.  He says the side effects overlapping but somewhat different.  He is able to differentiate the side effects between the 2 but certainly if he increases his pirfenidone  he will get more dizzy.  His shortness of breath is stable his walking desaturation test is stable.  His pulmonary function test is stable.  His CT scan of the chest also shows stability and IPF over 1 year.  Of note he has some new nodules described in the right lower lobe in August 2023 CT chest.   A 66-month follow-up is recommended.  He had recent liver function test and that is normal.   CT Chest data - HRCT Aug 2023  Narrative & Impression  CLINICAL DATA:  Interstitial lung disease   EXAM: CT CHEST WITHOUT CONTRAST   TECHNIQUE: Multidetector CT imaging of the chest was performed following the standard protocol without intravenous contrast. High resolution imaging of the lungs, as well as inspiratory and expiratory imaging, was performed.   RADIATION DOSE REDUCTION: This exam was performed according to the departmental dose-optimization program which includes automated exposure control, adjustment of the mA and/or kV according to patient size and/or use of iterative reconstruction technique.   COMPARISON:  Chest CT dated Oct 03, 2020   FINDINGS: Cardiovascular: Normal heart size. No pericardial effusion. Normal caliber thoracic aorta with severe calcified plaque. Severe coronary artery calcifications of the LAD. Partially visualized left chest wall pacer wires with leads in the right atrium, right ventricle and lead overlying the left ventricle.   Mediastinum/Nodes: Esophagus and thyroid  are unremarkable. No pathologically enlarged lymph nodes seen in the chest.   Lungs/Pleura: Central airways are patent. No significant air trapping. Severe centrilobular emphysema. Subpleural and basilar predominant reticular opacities with associated ground-glass, traction bronchiectasis. Honeycomb change is seen in the medial right lower lobe on series 12 image 199, not significantly changed when compared with prior exam. No definite evidence of progressive fibrosis. New clustered part solid nodules of the right lower lobe, largest nodule measures 17 mm in overall diameter with 6 mm solid component, located on series 12, image 261.   Upper Abdomen: No acute abnormality.   Musculoskeletal: No chest wall mass or suspicious bone lesions identified.   IMPRESSION: 1. Basilar  and subpleural predominant fibrotic interstitial lung disease with traction bronchiectasis and honeycomb change. No evidence of progression. Findings are consistent with UIP per consensus guidelines: Diagnosis of Idiopathic Pulmonary Fibrosis: An Official ATS/ERS/JRS/ALAT Clinical Practice Guideline. Am JINNY Honey Crit Care Med Vol 198, Iss 5, [id number], Jan 04 2017. 2. New clustered part solid pulmonary nodules of the right lower lobe, likely due to infection or aspiration. Recommend follow-up chest CT in 3 months to ensure resolution. 3. Aortic Atherosclerosis (ICD10-I70.0) and Emphysema (ICD10-J43.9).     Electronically Signed   By: Rea Marc M.D.   On: 12/07/2021 10:54    No results found.   OV 04/04/2022  Subjective:  Patient ID: Luke [name], male , DOB: 27-Oct-1945 , age 71 y.o. , MRN: [id number] , ADDRESS: [address] [place] [address] PCP Cleatus Arlyss RAMAN, MD Patient Care Team: Cleatus Arlyss RAMAN, MD as  PCP - General Jeffrie Oneil BROCKS, MD as PCP - Cardiology (Cardiology) Eliza Lonni RAMAN, MD (Vascular Surgery) Burnette Fallow, MD (Gastroenterology) Sheldon Standing, MD (General Surgery) Joshua Alm RAMAN, MD (Neurosurgery) Abigail Maude POUR (Optometry) Jeffrie Oneil BROCKS, MD (Cardiology) Fate Morna SAILOR, [facility] as Pharmacist (Pharmacist)  This Provider for this visit: Treatment Team:  Attending Provider: Geronimo Amel, MD     04/04/2022 -   Chief Complaint  Patient presents with   Follow-up    PFT performed today.  Pt states he has been doing okay since last visit and denies any complaints.     HPI Fallow LITTIE [name] 78 y.o. -returns for follow-up.  At this visit he had pulmonary function test that shows stability in FVC but his DLCO is decreased.  Symptom wise he feels stable.  He had a high-resolution CT chest that showed stability in the ILD for over a time.  He believes the DLCO decline is because of his heart failure because he feels good.  He  is able to mow his lawn and raking leaves.  Based on the decline in DLCO he is open to getting his BNP and also CBC checked.  He also wants his kidney function checked.  He is compliant with his medications.  Of note his lung nodules have resolved on the CT chest. LFT normal Nov 2023  He continues on Esbriet  2 pills 3 times daily. He did recently lose his dog of 15 years a Aeronautical Engineer. - 10 days ago he also fell from the ladder and sprained his ankle     OV 07/29/2022  Subjective:  Patient ID: Fallow LITTIE [name], male , DOB: 1945/08/22 , age 47 y.o. , MRN: [id number] , ADDRESS: [address] [place] [address] PCP Cleatus Arlyss RAMAN, MD Patient Care Team: Cleatus Arlyss RAMAN, MD as PCP - General Jeffrie Oneil BROCKS, MD as PCP - Cardiology (Cardiology) Eliza Lonni RAMAN, MD (Vascular Surgery) Burnette Fallow, MD (Gastroenterology) Sheldon Standing, MD (General Surgery) Joshua Alm RAMAN, MD (Neurosurgery) Abigail Maude POUR (Optometry) Jeffrie Oneil BROCKS, MD (Cardiology) Fate Morna SAILOR, [facility] as Pharmacist (Pharmacist)  This Provider for this visit: Treatment Team:  Attending Provider: Geronimo Amel, MD   07/29/2022 -   Chief Complaint  Patient presents with   Follow-up    Review PFT today.  Doing well.  Using exercise bike at home and gym 3 days a week.     HPI Fallow LITTIE [name] 78 y.o. -returns for follow-up.  At his last visit he was feeling well and his CT scan was stable his FVC was stable but his DLCO had dropped.  Therefore we decided to bring him back a little bit early to recheck his pulmonary function test.  He now tells me at the last pulmonary function test he did not have his dentures and possibly there was an air leak.  Today his FVC and DLCO are all stable.  He feels good.  He is tolerating his pirfenidone  well.  Weight is stable.  He is dealing with other issues other than IPF.  He says his right lower extremity one of the main arteries is got claudication again.   He has a right foot callus that is now an ulcer.  He has to have surgery for both.  He is dealing with these issues.  Most recent liver function test was in February 2024 and pirfenidone  and normal.  His current symptom scores are listed below.  His walking desaturation test is stable.  OV 01/09/2023  Subjective:  Patient ID: Luke [name], male , DOB: 11-21-1945 , age 80 y.o. , MRN: [id number] , ADDRESS: [address] [place] [address] PCP Cleatus Arlyss RAMAN, MD Patient Care Team: Cleatus Arlyss RAMAN, MD as PCP - General Jeffrie Oneil BROCKS, MD as PCP - Cardiology (Cardiology) Fernande Elspeth BROCKS, MD as PCP - Electrophysiology (Cardiology) Eliza Lonni RAMAN, MD (Vascular Surgery) Burnette Elsie, MD (Gastroenterology) Sheldon Elspeth, MD (General Surgery) Joshua Alm Hamilton, MD (Neurosurgery) Abigail Maude POUR (Optometry) Jeffrie Oneil BROCKS, MD (Cardiology) Fate Morna SAILOR, [facility] (Inactive) as Pharmacist (Pharmacist) Green, Davina E, RN as [facility] Management  This Provider for this visit: Treatment Team:  Attending Provider: Geronimo Amel, MD   01/09/2023 -   Chief Complaint  Patient presents with   Follow-up    SOB  PFT 01/09/2023     HPI Luke [name] 78 y.o. -routine follow-up.  Presents with his wife Avelina who I am meeting for the first time.  She is a patient of Dr. Brenna.  He tells me since his last visit he has had 4 coronary stents placed including May and June 2024.  After that he also had allergy to Plavix .  He is also had vascular surgery to his lower extremity.  He says after all this he is now more short of breath.  He feels he is declining.  Dyspnea scale is 11.  He feels oxygen  at night is helping him.  Dyspnea score is definitely worse.  He is tolerating pirfenidone  fine.  In June 2024 his BNP was elevated.  He and his wife are wondering if the decline is because of his heart.  He did have pulmonary function test today.  I  personally visualized the report and showed it to him.  His FVC stable in the last 1 year but his DLCO shows a dramatic decline.  There is no history of anemia.  We did a sit/stand exercise hypoxemia test and is having new onset desaturations below 88%.  There is no orthopnea or paroxysmal nocturnal dyspnea edema.  OV 01/23/2023  Subjective:  Patient ID: Luke [name], male , DOB: [birth year] , age 52 y.o. , MRN: [id number] , ADDRESS: [address] [place] [address] PCP Cleatus Arlyss RAMAN, MD Patient Care Team: Cleatus Arlyss RAMAN, MD as PCP - General Jeffrie Oneil BROCKS, MD as PCP - Cardiology (Cardiology) Fernande Elspeth BROCKS, MD as PCP - Electrophysiology (Cardiology) Eliza Lonni RAMAN, MD (Vascular Surgery) Burnette Elsie, MD (Gastroenterology) Sheldon Elspeth, MD (General Surgery) Joshua Alm Hamilton, MD (Neurosurgery) Abigail Maude POUR (Optometry) Jeffrie Oneil BROCKS, MD (Cardiology) Fate Morna SAILOR, [facility] (Inactive) as Pharmacist (Pharmacist) Green, Davina E, RN as [facility] Care Management  This Provider for this visit: Treatment Team:  Attending Provider: Geronimo Amel, MD  Type of visit: Video Virtual Visit Identification of patient GARRIT MARROW with 1945/07/25 and MRN [id number] - 2 person identifier Risks: Risks, benefits, limitations of telephone visit explained. Patient understood and verbalized agreement to proceed Anyone else on call: no Patient location: his home This provider location: [address]; [place]; [place] [address]. [facility]. [phone]    01/23/2023 -   Chief Complaint  Patient presents with   Follow-up    Breathing is about the same.      HPI Luke [name] 78 y.o. - video visit for IPF.  As opposed to review his CT scan of the chest.  However the  official report is not done was done 2 days ago.  I personally visualized it.  He does have UIP but I am not sure if the fibrosis worse.  Might be  worse.  I think it is worse.  He is got his oxygen  on but his portable oxygen  that we set up was not ordered by our office.  I apologize.  We are going to take care of that.  He did see cardiology Dr. Jeffrie today.  He is complaining of rash he feels is from Lasix .  It is in his extremities.  He denies it is from Esbriet  although I told him it could be from Esbriet .  He stopped his Lasix .  He was also having dizziness and Dr. Jeffrie stopped/reduced a few other medications.  Otherwise no new issues.  Given the rash I said I will see him back in 6 weeks.   OV 05/13/2023  Subjective:  Patient ID: Luke [name], male , DOB: February 15, 1946 , age 89 y.o. , MRN: [id number] , ADDRESS: [address] [place] [address] PCP Cleatus Arlyss RAMAN, MD Patient Care Team: Cleatus Arlyss RAMAN, MD as PCP - General Jeffrie Oneil BROCKS, MD as PCP - Cardiology (Cardiology) Fernande Elspeth BROCKS, MD as PCP - Electrophysiology (Cardiology) Eliza Lonni RAMAN, MD (Inactive) (Vascular Surgery) Burnette Elsie, MD (Gastroenterology) Sheldon Elspeth, MD (General Surgery) Joshua Alm Hamilton, MD (Neurosurgery) Jeffrie Oneil BROCKS, MD (Cardiology) Fate Morna SAILOR, [facility] (Inactive) as Pharmacist (Pharmacist) Green, Davina E, RN as [facility] Management Dunn, Maude POUR [facility]) Lavonia Lye, MD as Consulting Physician (Ophthalmology)  This Provider for this visit: Treatment Team:  Attending Provider: Hope Almarie ORN, NP    05/13/2023 -   Chief Complaint  Patient presents with   Follow-up    Currently on pred, 3 pills left, pt denies any concerns. Pt did receive POC      FU IPF with some emphysema. Has sCHF = - esbriet  shipment at his home 03/23/21  - on esbiret 2 pills tid due to CKD hx (ofev kept as 2nd choice due to his heart issues)  -  esbriet  on hold since mid march 2023 due to dizziness made by worse by nitrate and improved after stopping esbriet   - restart late may 2023  -Noiv 2023:  -  Stable IPF on high-resolution CT chest , FVC and symptoms November 2023 - stable PFT March 2024  -Progression on CT chest September 2024 versus 2022 but stable compared to 2023. -Pulmonary function test December 2024 with ongoing progression. -Excess hypoxemia started on portable oxygen  fall 2024  Pulmonary nodules resolved November 2023  HPI Luke [name] 78 y.o. -returns for follow-up.  At last visit he had rash therefore we seeing him sooner.  He did not think the rash was from his Esbriet .  He felt it was from his cardiac medications.  He saw dermatology is currently on steroid taper and his rash is gone.  He is absolutely convinced the rash is not from Esbriet .  The rash was on the lower extremities and nonsun exposed areas.  He also tells me that the shortness of breath is better and effort tolerance is better in association with the prednisone .  But he agreed that the prednisone  is causing hyperglycemia and he does not want to do this long-term.  He is exercising 30 minutes on the treadmill with portable oxygen .  He also uses nighttime oxygen .  These are helping but he does have a chest burn 15 minutes  into the exercise and then it gets better.  In terms of his systolic heart failure in September 2024 he had an echo and his systolic heart failure is completely resolved.  He is happy about this.  We discussed the possibility that his pulmonary fibrosis progressing although it is more recently stable.  I did show him that the pulmonary function test recently is also progressing.  I do believe that the pirfenidone  is having beneficial effect but nevertheless it seems to be on some ongoing progression.  Discussed clinical trials as a care option.  He says overall he is exhausted taking a lot of medications but is aware of the fact his fibrosis progressing and there is no other standard of care therapy add-on available [nintedanib is indicated as a replacement for pirfenidone ].  We talked about him  participating in study called beacon with [employer].  He is potentially interested.  He met with the research coordinator and took the consent form.  In terms of this.  Liver function test in December 2024 was normal.   External record review shows no hospitalizations or emergency room visits.  SYMPTOM SCALE - ILD 01/02/2021  08/08/2021 188# -4 miles an hour on a treadmill for 30 minutes.  Covers 2 miles on no incline 10/03/2021  01/10/2022 187# 04/04/2022   07/29/2022  01/09/2023 176# 05/13/2023 Low-dose as per protocol Portable oxygen  Nighttime oxygen   O2 use ra ra ra ra ra ra ra   Shortness of Breath 0 -> 5 scale with 5 being worst (score 6 If unable to do)      0   At rest 0 0 0 0 0 0 0 0  Simple tasks - showers, clothes change, eating, shaving 0 0  0 0 0 1 1  Household (dishes, doing bed, laundry) 0 0 0 0 0 0 1 0  Shopping 0 0 0 0 0 2 1 1   Walking level at own pace 0 0 00 0 0 2 3 3   Walking up Stairs 2 2 2 2 4 2 5 4   Total (30-36) Dyspnea Score 2 2 2 2 4 6 11 10   How bad is your cough? 0 0 00 0 0 0 0 0  How bad is your fatigue 00 0 0 0 0 0 0 0  How bad is nausea 0 0 0 0 0 0 0 0  How bad is vomiting?  0 0 0  0 0 0 0  How bad is diarrhea? 00 0 0 0 0 0 0 0  How bad is anxiety? 0 0 0 00 0 0 00 0  How bad is depression 0 0 0 0 0 0 0 0        Simple office walk 185 feet x  3 laps goal with forehead probe 01/02/2021  08/08/2021  01/10/2022  07/29/2022  01/09/2023    O2 used ra ra ra ra Ra   Number laps completed 3 3 2 3  Sit stand x 10   Comments about pace avg       Resting Pulse Ox/HR 97% and 71/min 98% and HR 71 98% and HR66 98% and HR 65 96% and HR 86   Final Pulse Ox/HR 95% and 94/min 91% and HR 86 93% and HR 91 92% an dHR 84 84% and HR 94. Corrected with 2L o2   Desaturated </= 88% no no No no    Desaturated <= 3% points no Yes, 7 poins Yes 5 ponts Yes 6 potn  Got Tachycardic >/= 90/min yes no yes no    Symptoms at end of test none none none none Level 2 dyspea    Miscellaneous comments x    worsening      IMPRESSION: 1. 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 Pulmonary parenchymal pattern of fibrosis, as detailed above, stable from 03/27/2022 but progressive from 10/03/2020. Findings are consistent with UIP per consensus guidelines: Diagnosis of Idiopathic Pulmonary Fibrosis: An Official ATS/ERS/JRS/ALAT Clinical Practice Guideline. Am JINNY Honey Crit Care Med Vol 198, Iss 5, [phone], Jan 04 2017. 2. Aortic atherosclerosis (ICD10-I70.0). Coronary artery calcification     Electronically Signed   By: Newell Eke M.D.   On: 02/03/2023 15:28    PFT     Latest Ref Rng & Units 04/25/2023   12:45 PM 01/09/2023   11:01 AM 07/29/2022   10:16 AM 04/04/2022    1:20 PM 01/10/2022    1:42 PM 10/02/2021    8:45 AM 05/10/2021   11:45 AM  ILD indicators  FVC-Pre L 3.53  3.76  3.83  3.79  3.73  4.09  3.63   FVC-Predicted Pre % 77  82  83  82  81  88  78   TLC L  6.05        TLC Predicted %  81        DLCO uncorrected ml/min/mmHg 8.22  6.86  11.69  9.71  11.84  13.10  11.58   DLCO UNC %Pred % 30  25  43  36  44  48  43   DLCO Corrected ml/min/mmHg 8.22   11.69  9.71  11.84  13.45  11.36   DLCO COR %Pred % 30   43  36  44  50  42       LAB RESULTS last 96 hours No results found.  LAB RESULTS last 90 days Recent Results (from the past 2160 hours)  POCT glycosylated hemoglobin (Hb A1C)     Status: Abnormal   Collection Time: 03/17/23 10:42 AM  Result Value Ref Range   Hemoglobin A1C 7.9 (A) 4.0 - 5.6 %   HbA1c POC (<> result, manual entry)     HbA1c, POC (prediabetic range)     HbA1c, POC (controlled diabetic range)    CUP PACEART REMOTE DEVICE CHECK     Status: None   Collection Time: 04/23/23  2:10 AM  Result Value Ref Range   Date Time Interrogation Session [phone]    Pulse Generator Manufacturer SJCR    Pulse Gen Model [id number] Gallant HF    Pulse Gen Serial Number [id number]    Clinic Name [facility]    Implantable Pulse Generator Type Cardiac Resynch Therapy Defibulator    Implantable Pulse Generator Implant Date [id number]    Implantable Lead Manufacturer [facility]    Implantable Lead Model 1458Q Quartet    Implantable Lead Serial Number [id number]    Implantable Lead Implant Date 79859686    Implantable Lead Location Detail 1 Lateral Wall    Implantable Lead Location [id number]    Implantable Lead Connection Status [id number]    Implantable Lead Manufacturer [facility]    Implantable Lead Model [id number] Tendril STS    Implantable Lead Serial Number [id number]    Implantable Lead Implant Date 79859686    Implantable Lead Location [id number]    Implantable Lead Connection Status [id number]    Implantable Lead Manufacturer SJCR    Implantable Lead Model [id number] Durata SJ4    Implantable Lead Serial  Number [id number]    Implantable Lead Implant Date 79859686    Implantable Lead Location [id number]    Implantable Lead Connection Status [id number]    Lead Channel Setting Sensing Sensitivity 0.5 mV   Lead Channel Setting Sensing Adaptation Mode Adaptive Sensing    Lead Channel Setting Pacing Amplitude 2.0 V   Lead Channel Setting Pacing Pulse Width 0.5 ms   Lead Channel Setting Pacing Amplitude 1.75 V   Lead Channel Setting Pacing Pulse Width 0.6 ms   Lead Channel Setting Pacing Amplitude 2.5 V   Lead Channel Setting Pacing Capture Mode Adaptive Capture    Zone Setting Status Active    Zone Setting Status Inactive    Zone Setting Status Active    Lead Channel Status NULL    Lead Channel Impedance Value 700 ohm   Lead Channel Pacing Threshold Amplitude 2.0 V   Lead Channel Pacing Threshold Pulse Width 0.6 ms   Lead Channel Status NULL    Lead Channel Impedance Value 350 ohm   Lead Channel Sensing Intrinsic Amplitude 1.9 mV   Lead Channel Pacing Threshold Amplitude 0.75 V   Lead Channel Pacing Threshold Pulse Width 0.5 ms   Lead Channel Status NULL    Lead Channel Impedance Value 380 ohm   Lead Channel  Sensing Intrinsic Amplitude 12.0 mV   Lead Channel Pacing Threshold Amplitude 0.75 V   Lead Channel Pacing Threshold Pulse Width 0.5 ms   HighPow Impedance 70 ohm   HighPow Imped Status NULL    Battery Status MOS    Battery Remaining Longevity 64 mo   Battery Remaining Percentage 70.0 %   Battery Voltage 2.96 V   Brady Statistic RA Percent Paced 12.0 %   Brady Statistic AP VP Percent 12.0 %   Brady Statistic AS VP Percent 88.0 %   Brady Statistic AP VS Percent 1.0 %   Brady Statistic AS VS Percent 1.0 %  VAS US  ABI WITH/WO TBI     Status: None   Collection Time: 04/23/23  1:19 PM  Result Value Ref Range   Right ABI 0.53    Left ABI 0.88   Antiskin Autoantibodies, Quant     Status: None   Collection Time: 04/24/23  2:06 PM  Result Value Ref Range   Pemphigus Negative Neg <1:10  CBC with Differential/Platelet     Status: Abnormal   Collection Time: 04/24/23  2:06 PM  Result Value Ref Range   WBC 10.8 3.4 - 10.8 x10E3/uL   RBC 3.84 (L) 4.14 - 5.80 x10E6/uL   Hemoglobin 12.5 (L) 13.0 - 17.7 g/dL   Hematocrit 61.8 62.4 - 51.0 %   MCV 99 (H) 79 - 97 fL   MCH 32.6 26.6 - 33.0 pg   MCHC 32.8 31.5 - 35.7 g/dL   RDW 87.5 88.3 - 84.5 %   Platelets 334 150 - 450 x10E3/uL   Neutrophils 77 Not Estab. %   Lymphs 12 Not Estab. %   Monocytes 7 Not Estab. %   Eos 2 Not Estab. %   Basos 1 Not Estab. %   Neutrophils Absolute 8.4 (H) 1.4 - 7.0 x10E3/uL   Lymphocytes Absolute 1.3 0.7 - 3.1 x10E3/uL   Monocytes Absolute 0.7 0.1 - 0.9 x10E3/uL   EOS (ABSOLUTE) 0.2 0.0 - 0.4 x10E3/uL   Basophils Absolute 0.1 0.0 - 0.2 x10E3/uL   Immature Granulocytes 1 Not Estab. %   Immature Grans (Abs) 0.1 0.0 - 0.1 x10E3/uL  Allergens w/Comp Rflx Area 2  Status: None   Collection Time: 04/24/23  2:06 PM  Result Value Ref Range   Class Description Allergens Comment     Comment:     Levels of Specific IgE       Class  Description of Class     ---------------------------  -----  --------------------                     < 0.10         0         Negative            0.10 -    0.31         0/I       Equivocal/Low            0.32 -    0.55         I         Low            0.56 -    1.40         II        Moderate            1.41 -    3.90         III       High            3.91 -   19.00         IV        Very High           19.01 -  100.00         V         Very High                   >100.00         VI        Very High    IgE (Immunoglobulin E), Serum 47 6 - 495 IU/mL   D Pteronyssinus IgE <0.10 Class 0 kU/L   D Farinae IgE <0.10 Class 0 kU/L   E001-IgE Cat Dander <0.10 Class 0 kU/L   E005-IgE Dog Dander <0.10 Class 0 kU/L   Bermuda Grass IgE <0.10 Class 0 kU/L   Timothy Grass IgE <0.10 Class 0 kU/L   Johnson Grass IgE <0.10 Class 0 kU/L   Cockroach, German IgE <0.10 Class 0 kU/L   Penicillium Chrysogen IgE <0.10 Class 0 kU/L   Cladosporium Herbarum IgE <0.10 Class 0 kU/L   Aspergillus Fumigatus IgE <0.10 Class 0 kU/L   Alternaria Alternata IgE <0.10 Class 0 kU/L   Maple/Box Elder IgE <0.10 Class 0 kU/L   Common Silver  Birch IgE <0.10 Class 0 kU/L   Cedar, [place] IgE <0.10 Class 0 kU/L   Oak, White IgE <0.10 Class 0 kU/L   Elm, American IgE <0.10 Class 0 kU/L   Cottonwood IgE <0.10 Class 0 kU/L   Pecan, Hickory IgE <0.10 Class 0 kU/L   White Mulberry IgE <0.10 Class 0 kU/L   Ragweed, Short IgE <0.10 Class 0 kU/L   Pigweed, Rough IgE <0.10 Class 0 kU/L   Sheep Sorrel IgE Qn <0.10 Class 0 kU/L   Mouse Urine IgE <0.10 Class 0 kU/L  CMP14+EGFR     Status: Abnormal   Collection Time: 04/24/23  2:06 PM  Result Value Ref Range   Glucose 225 (H) 70 - 99 mg/dL   BUN 37 (H) 8 - 27  mg/dL   Creatinine, Ser 8.66 (H) 0.76 - 1.27 mg/dL   eGFR 55 (L) >40 fO/fpw/8.26   BUN/Creatinine Ratio 28 (H) 10 - 24   Sodium 139 134 - 144 mmol/L   Potassium 5.5 (H) 3.5 - 5.2 mmol/L   Chloride 99 96 - 106 mmol/L   CO2 22 20 - 29 mmol/L   Calcium  9.9 8.6 - 10.2 mg/dL   Total Protein 6.9 6.0 - 8.5 g/dL    Albumin  4.0 3.8 - 4.8 g/dL   Globulin, Total 2.9 1.5 - 4.5 g/dL   Bilirubin Total 0.3 0.0 - 1.2 mg/dL   Alkaline Phosphatase 88 44 - 121 IU/L   AST 14 0 - 40 IU/L   ALT 21 0 - 44 IU/L  Sedimentation rate     Status: None   Collection Time: 04/24/23  2:06 PM  Result Value Ref Range   Sed Rate 18 0 - 30 mm/hr  ANA, IFA (with reflex)     Status: None   Collection Time: 04/24/23  2:06 PM  Result Value Ref Range   ANA Titer 1 Negative     Comment:                                      Negative   <1:80                                      Borderline  1:80                                      Positive   >1:80 ICAP nomenclature: AC-0 For more information about Hep-2 cell patterns use ANApatterns.org, the official website for the International Consensus on Antinuclear Antibody (ANA) Patterns (ICAP).   C-reactive protein     Status: None   Collection Time: 04/24/23  2:06 PM  Result Value Ref Range   CRP 9 0 - 10 mg/L  Tryptase     Status: None   Collection Time: 04/24/23  2:06 PM  Result Value Ref Range   Tryptase 7.1 2.2 - 13.2 ug/L  Pulmonary function test     Status: None   Collection Time: 04/25/23 12:45 PM  Result Value Ref Range   FVC-Pre 3.53 L   FVC-%Pred-Pre 77 %   FEV1-Pre 2.39 L   FEV1-%Pred-Pre 72 %   FEV6-Pre 3.53 L   FEV6-%Pred-Pre 82 %   Pre FEV1/FVC ratio 68 %   FEV1FVC-%Pred-Pre 93 %   Pre FEV6/FVC Ratio 100 %   FEV6FVC-%Pred-Pre 106 %   FEF 25-75 Pre 1.35 L/sec   FEF2575-%Pred-Pre 57 %   DLCO unc 8.22 ml/min/mmHg   DLCO unc % pred 30 %   DLCO cor 8.22 ml/min/mmHg   DLCO cor % pred 30 %   DL/VA 8.22 ml/min/mmHg/L   DL/VA % pred 45 %         has a past medical history of AAA (abdominal aortic aneurysm) (HCC) (2011), Angio-edema, Atrial fibrillation (HCC), CAD (coronary artery disease), Cardiomyopathy (HCC), Chronic kidney disease, COVID-19, Diabetes mellitus, GERD (gastroesophageal reflux disease), HLD (hyperlipidemia), Hypertension, ICD (implantable  cardioverter-defibrillator) in place, IPF (idiopathic pulmonary fibrosis) (HCC), LBBB (left bundle branch block), Low testosterone, On home oxygen  therapy, Pacemaker, PAD (peripheral artery disease) (  HCC), Pancreatitis, Peripheral arterial disease (HCC), and Umbilical hernia.   reports that he quit smoking about 19 years ago. His smoking use included cigarettes. He started smoking about 61 years ago. He has a 84 pack-year smoking history. He has never been exposed to tobacco smoke. He has never used smokeless tobacco.  Past Surgical History:  Procedure Laterality Date   ABDOMINAL AORTAGRAM N/A 10/28/2011   Procedure: ABDOMINAL EZELLA;  Surgeon: Lonni GORMAN Blade, MD;  Location[facility] CATH LAB;  Service: Cardiovascular;  Laterality: N/A;   ABDOMINAL AORTIC ANEURYSM REPAIR     EVAR    ABDOMINAL AORTOGRAM N/A 05/27/2022   Procedure: ABDOMINAL AORTOGRAM;  Surgeon: Sheree Penne Lonni, MD;  Location: [facility] INVASIVE CV LAB;  Service: Cardiovascular;  Laterality: N/A;   BI-VENTRICULAR IMPLANTABLE CARDIOVERTER DEFIBRILLATOR N/A 07/16/2012   Procedure: BI-VENTRICULAR IMPLANTABLE CARDIOVERTER DEFIBRILLATOR  (CRT-D);  Surgeon: Elspeth JAYSON Sage, MD;  Location: [facility] CATH LAB;  Service: Cardiovascular;  Laterality: N/A;   BIV ICD GENERATOR CHANGEOUT N/A 10/25/2020   Procedure: BIV ICD GENERATOR CHANGEOUT;  Surgeon: Sage Elspeth JAYSON, MD;  Location: [facility] INVASIVE CV LAB;  Service: Cardiovascular;  Laterality: N/A;   CARDIAC CATHETERIZATION     CATARACT EXTRACTION Left 2023   COLONOSCOPY  03/23/2018; 2020   CORONARY CTO INTERVENTION N/A 10/10/2022   Procedure: CORONARY CTO INTERVENTION;  Surgeon: Dann Candyce GORMAN, MD;  Location: [facility] INVASIVE CV LAB;  Service: Cardiovascular;  Laterality: N/A;   CORONARY LITHOTRIPSY N/A 09/18/2022   Procedure: CORONARY LITHOTRIPSY;  Surgeon: Dann Candyce GORMAN, MD;  Location: [facility] INVASIVE CV LAB;  Service: Cardiovascular;  Laterality: N/A;   CORONARY STENT INTERVENTION N/A  09/18/2022   Procedure: CORONARY STENT INTERVENTION;  Surgeon: Dann Candyce GORMAN, MD;  Location: [facility] INVASIVE CV LAB;  Service: Cardiovascular;  Laterality: N/A;   CORONARY ULTRASOUND/IVUS N/A 09/18/2022   Procedure: Coronary Ultrasound/IVUS;  Surgeon: Dann Candyce GORMAN, MD;  Location: [facility] INVASIVE CV LAB;  Service: Cardiovascular;  Laterality: N/A;   ENDARTERECTOMY FEMORAL Right 11/19/2022   Procedure: RIGHT COMMON FEMORAL ENDARTERECTOMY WITH VEIN PATCH ANGIOPLASTY;  Surgeon: Sheree Penne Lonni, MD;  Location: [facility] OR;  Service: Vascular;  Laterality: Right;   ESOPHAGOGASTRODUODENOSCOPY N/A 02/12/2019   Procedure: ESOPHAGOGASTRODUODENOSCOPY (EGD);  Surgeon: Charlanne Groom, MD;  Location: [facility] ENDOSCOPY;  Service: Endoscopy;  Laterality: N/A;   ESOPHAGOGASTRODUODENOSCOPY (EGD) WITH PROPOFOL  N/A 02/05/2018   Procedure: ESOPHAGOGASTRODUODENOSCOPY (EGD) WITH PROPOFOL ;  Surgeon: Shila Gustav GAILS, MD;  Location: [facility] ENDOSCOPY;  Service: Endoscopy;  Laterality: N/A;   ESOPHAGOGASTRODUODENOSCOPY (EGD) WITH PROPOFOL  N/A 09/27/2018   Procedure: ESOPHAGOGASTRODUODENOSCOPY (EGD) WITH PROPOFOL ;  Surgeon: Rollin Dover, MD;  Location: [facility] ENDOSCOPY;  Service: Endoscopy;  Laterality: N/A;   FOREIGN BODY REMOVAL  09/27/2018   Procedure: FOREIGN BODY REMOVAL;  Surgeon: Rollin Dover, MD;  Location: [facility] ENDOSCOPY;  Service: Endoscopy;;   FOREIGN BODY REMOVAL  02/12/2019   Procedure: FOREIGN BODY REMOVAL;  Surgeon: Charlanne Groom, MD;  Location: [facility] ENDOSCOPY;  Service: Endoscopy;;   HERNIA REPAIR  05/14/2013   INSERTION OF MESH N/A 05/14/2013   Procedure: INSERTION OF MESH;  Surgeon: Elspeth KYM Schultze, MD;  Location: [facility] OR;  Service: General;  Laterality: N/A;   LEFT HEART CATH N/A 10/10/2022   Procedure: Left Heart Cath;  Surgeon: Dann Candyce GORMAN, MD;  Location: [facility] INVASIVE CV LAB;  Service: Cardiovascular;  Laterality: N/A;   LEFT HEART CATH AND CORONARY ANGIOGRAPHY N/A 07/17/2021   Procedure: LEFT HEART  CATH AND CORONARY ANGIOGRAPHY;  Surgeon: Burnard Debby LABOR, MD;  Location: [facility]  INVASIVE CV LAB;  Service: Cardiovascular;  Laterality: N/A;   LEFT HEART CATH AND CORONARY ANGIOGRAPHY N/A 09/18/2022   Procedure: LEFT HEART CATH AND CORONARY ANGIOGRAPHY;  Surgeon: Dann Candyce RAMAN, MD;  Location: [facility] INVASIVE CV LAB;  Service: Cardiovascular;  Laterality: N/A;   LOWER EXTREMITY ANGIOGRAM Bilateral 10/28/2011   Procedure: LOWER EXTREMITY ANGIOGRAM;  Surgeon: Lonni RAMAN Blade, MD;  Location: [facility] CATH LAB;  Service: Cardiovascular;  Laterality: Bilateral;   LOWER EXTREMITY ANGIOGRAM Right 11/19/2022   Procedure: RIGHT LOWER EXTREMITY ANGIOGRAM;  Surgeon: Sheree Penne Lonni, MD;  Location: [facility] OR;  Service: Vascular;  Laterality: Right;   LOWER EXTREMITY ANGIOGRAPHY  05/27/2022   Procedure: Lower Extremity Angiography;  Surgeon: Sheree Penne Lonni, MD;  Location: [facility] INVASIVE CV LAB;  Service: Cardiovascular;;   PACEMAKER INSERTION  07/16/2012   pacemaker/defibrilator   PATCH ANGIOPLASTY Right 11/19/2022   Procedure: VEIN PATCH ANGIOPLASTY;  Surgeon: Sheree Penne Lonni, MD;  Location: [facility] OR;  Service: Vascular;  Laterality: Right;   POLYPECTOMY     POSTERIOR CERVICAL FUSION/FORAMINOTOMY N/A 06/09/2014   Procedure: Laminectomy - Cervical two-Cervcial four posterior cervical instrumented fusion Cervical two-cervical four;  Surgeon: Alm RAMAN Molt, MD;  Location: [facility] NEURO ORS;  Service: Neurosurgery;  Laterality: N/A;  posterior    SAVORY DILATION N/A 02/05/2018   Procedure: SAVORY DILATION;  Surgeon: Shila Gustav GAILS, MD;  Location: [facility] ENDOSCOPY;  Service: Endoscopy;  Laterality: N/A;   TONSILLECTOMY     as a child    UMBILICAL HERNIA REPAIR N/A 05/14/2013   Procedure: LAPAROSCOPIC exploration and repair of hernia in abdominal ;  Surgeon: Elspeth KYM Schultze, MD;  Location: [facility] OR;  Service: General;  Laterality: N/A;   UPPER GASTROINTESTINAL ENDOSCOPY  2020    Allergies  Allergen  Reactions   Aldactone  [Spironolactone ] Other (See Comments)    Hyperkalemia   Brilinta  [Ticagrelor ] Shortness Of Breath    Numbness in hands and feet Blurry vision   Clopidogrel  Rash    Redness and Itchiness   Codeine Rash and Hives   Amoxicillin  Hives   Atorvastatin      Myalgias with lipitor.  Does tolerate simvastatin .     Benadryl  [Diphenhydramine ] Hives   Januvia  [Sitagliptin ] Other (See Comments)    Diarrhea and heart racing   Jardiance  [Empagliflozin ] Other (See Comments)    Polyuria; excessive weight loss   Lisinopril      Possible cause of pancreatitis   Rosuvastatin  Other (See Comments)    myalgia   Lasix  [Furosemide ] Rash    Immunization History  Administered Date(s) Administered   Fluad Quad(high Dose 65+) 02/23/2020, 04/13/2021   Influenza Split 01/31/2011, 02/11/2012   Influenza Whole 02/27/2010   Influenza, High Dose Seasonal PF 02/08/2022   Influenza,inj,Quad PF,6+ Mos 03/18/2013, 03/25/2014, 01/19/2015, 02/21/2016, 02/27/2017, 02/12/2018, 02/18/2019   Influenza-Unspecified 03/03/2020   PFIZER(Purple Top)SARS-COV-2 Vaccination 05/23/2019, 06/12/2019, 02/08/2020, 10/09/2020   Pfizer Covid-19 Vaccine Bivalent Booster 74yrs & up 08/02/2021, 03/19/2023   Pneumococcal Conjugate-13 10/18/2014   Pneumococcal Polysaccharide-23 11/14/2011   Td 07/05/2010    Family History  Problem Relation Age of Onset   Hypertension Mother    Stroke Mother    Hyperlipidemia Mother    Lung cancer Father    Diabetes Sister    Heart disease Sister        Before age 78   Hypertension Sister    Hyperlipidemia Sister    Heart attack Sister    Hypertension Son    Colon cancer Neg Hx    Prostate cancer  Neg Hx    Esophageal cancer Neg Hx    Stomach cancer Neg Hx    Rectal cancer Neg Hx    Colon polyps Neg Hx    Pancreatic cancer Neg Hx      Current Outpatient Medications:    albuterol  (VENTOLIN  HFA) 108 (90 Base) MCG/ACT inhaler, INHALE 2 PUFFS INTO THE LUNGS EVERY 6 HOURS  AS NEEDED FOR WHEEZING OR SHORTNESS OF BREATH, Disp: 6.7 g, Rfl: 3   aspirin  EC 81 MG tablet, Take 1 tablet (81 mg total) by mouth daily., Disp: 120 tablet, Rfl: 2   carvedilol  (COREG ) 25 MG tablet, TAKE 1 TABLET BY MOUTH TWICE DAILY WITH MEALS, Disp: 180 tablet, Rfl: 3   Cholecalciferol  (VITAMIN D3) 50 MCG (2000 UT) TABS, Take 2,000 Units by mouth in the morning., Disp: , Rfl:    Cyanocobalamin  (VITAMIN B-12 PO), Take 2,000 mcg by mouth in the morning., Disp: , Rfl:    glipiZIDE  (GLUCOTROL ) 5 MG tablet, TAKE 1 TABLET(5 MG) BY MOUTH TWICE DAILY BEFORE A MEAL, Disp: 180 tablet, Rfl: 1   glucose blood (ACCU-CHEK AVIVA PLUS) test strip, USE AS DIRECTED TO TEST BLOOD SUGAR TWICE DAILY, Disp: 200 strip, Rfl: 3   insulin  glargine (LANTUS  SOLOSTAR) 100 UNIT/ML Solostar Pen, INJECT 0.3 TO 0.35 MLS(30 TO 35 UNITS) INTO THE SKIN EVERY DAY, Disp: 15 mL, Rfl: 3   metFORMIN  (GLUCOPHAGE ) 500 MG tablet, TAKE 2 TABLETS BY MOUTH TWICE DAILY WITH FOOD, Disp: 360 tablet, Rfl: 3   Multiple Vitamin (MULTIVITAMIN WITH MINERALS) TABS tablet, Take 1 tablet by mouth in the morning., Disp: , Rfl:    omeprazole  (PRILOSEC) 40 MG capsule, Take 40 mg by mouth daily., Disp: , Rfl:    prasugrel  (EFFIENT ) 10 MG TABS tablet, Take 1 tablet (10 mg total) by mouth daily., Disp: 90 tablet, Rfl: 1   predniSONE  (DELTASONE ) 10 MG tablet, Take one tablet (10mg ) twice daily ten days, then one tablet (10mg ) once daily for ten days, then STOP., Disp: 30 tablet, Rfl: 0   silver  sulfADIAZINE  (SILVADENE ) 1 % cream, Apply 1 Application topically daily. PCP to refill, Disp: 400 g, Rfl: 0   simvastatin  (ZOCOR ) 20 MG tablet, Take 1 tablet (20 mg total) by mouth at bedtime., Disp: 90 tablet, Rfl: 3   SSD 1 % cream, APPLY TOPICALLY TO THE AFFECTED AREA DAILY, Disp: 50 g, Rfl: 1   triamcinolone  ointment (KENALOG ) 0.1 %, Apply 1 Application topically 2 (two) times daily., Disp: 454 g, Rfl: 1   umeclidinium bromide  (INCRUSE ELLIPTA ) 62.5 MCG/ACT AEPB,  Inhale 1 puff into the lungs daily., Disp: 90 each, Rfl: 3   valsartan  (DIOVAN ) 40 MG tablet, Take 1 tablet (40 mg total) by mouth daily with supper., Disp: 90 tablet, Rfl: 1   torsemide  (DEMADEX ) 20 MG tablet, Take 1 tablet (20 mg total) by mouth daily., Disp: 90 tablet, Rfl: 2      Objective:   Vitals:   05/13/23 1329  BP: (!) 152/81  Pulse: 69  SpO2: 90%  Weight: 175 lb 12.8 oz (79.7 kg)  Height: 6' (1.829 m)    Estimated body mass index is 23.84 kg/m as calculated from the following:   Height as of this encounter: 6' (1.829 m).   Weight as of this encounter: 175 lb 12.8 oz (79.7 kg).  @WEIGHTCHANGE @  [employer]   05/13/23 1329  Weight: 175 lb 12.8 oz (79.7 kg)     Physical Exam   General: No distress. Looks better O2 at  rest: no Cane present: no Sitting in wheel chair: no Frail: no Obese: no Neuro: Alert and Oriented x 3. GCS 15. Speech normal Psych: Pleasant Resp:  Barrel Chest - no.  Wheeze - no, Crackles - YES BSE, No overt respiratory distress CVS: Normal heart sounds. Murmurs - no Ext: Stigmata of Connective Tissue Disease -  NONE ABD: Visceral obesity + HEENT: Normal upper airway. PEERL +. No post nasal drip        Assessment:       ICD-10-CM   1. IPF (idiopathic pulmonary fibrosis) (HCC)  J84.112     2. Exercise hypoxemia  R09.02     3. Centrilobular emphysema (HCC)  J43.2     4. Medication monitoring encounter  Z51.81     5. Shortness of breath  R06.02          Plan:     Patient Instructions     ICD-10-CM   1. IPF (idiopathic pulmonary fibrosis) (HCC)  J84.112     2. Centrilobular emphysema (HCC)  J43.2     3. Medication monitoring encounter  Z51.81     4. Exercise hypoxemia  R09.02          Re IPF with exercise hypoxemia  Sow progression over time though on cT recently stable but PFT suggessts ongoing progressio Glad heart isi mproved LFT is normal dec 2024  Plan - cotninue esbriet   at 2 pill thress times  daily; take with food -Continue  portable oxygen  system 2 L nasal cannula - Continue nighttime oxygen  as before - take consent form for BEACON study - meet Josh from [facility]  - study ends in a few weeks so do read it at home and reflect on it  - Josh and I will see  if you meet criteria  = check LFT in 3 months   Emphysema (minor component)   Stable  Plan -continue spiriva  respimat 2 puff daily  - continue  albuterol  prn  Lung nodules right - cluster - new Aug 2023 - resolved NOv 2023 - NO comment on CT Sept 2024  Plan  -reassure  Rash  - resolved and not due to esbreit per dermagtologist per your history  Plan  - monitor    Followup -  --  face to face vsiit Dr. Geronimo [ideally] or nurse practitioner in 14 weeks but after PFT; 30 min   FOLLOWUP Return in about 14 weeks (around 08/19/2023) for 30 min visit, after Spiro and DLCO, with Dr Geronimo, ILD, Face to Face Visit.    SIGNATURE    Dr. Dorethia Geronimo, M.D., F.C.C.P,  Pulmonary and Critical Care Medicine Staff Physician, [facility] Director - Interstitial Lung Disease  Program  Pulmonary Fibrosis [facility] at [facility] [place], [place], [address]  Pager: [phone], If no answer or between  15:00h - 7:00h: call [phone] Telephone: [phone]  1:55 PM 05/13/2023

## 2023-05-12 NOTE — Patient Instructions (Addendum)
 ICD-10-CM   1. IPF (idiopathic pulmonary fibrosis) (HCC)  J84.112     2. Centrilobular emphysema (HCC)  J43.2     3. Medication monitoring encounter  Z51.81     4. Exercise hypoxemia  R09.02          Re IPF with exercise hypoxemia  Sow progression over time though on cT recently stable but PFT suggessts ongoing progressio Glad heart isi mproved LFT is normal dec 2024  Plan - cotninue esbriet   at 2 pill thress times daily; take with food -Continue  portable oxygen  system 2 L nasal cannula - Continue nighttime oxygen  as before - take consent form for BEACON study - meet Josh from [facility]  - study ends in a few weeks so do read it at home and reflect on it  - Josh and I will see  if you meet criteria  = check LFT in 3 months   Emphysema (minor component)   Stable  Plan -continue spiriva  respimat 2 puff daily  - continue  albuterol  prn  Lung nodules right - cluster - new Aug 2023 - resolved NOv 2023 - NO comment on CT Sept 2024  Plan  -reassure  Rash  - resolved and not due to esbreit per dermagtologist per your history  Plan  - monitor    Followup -  --  face to face vsiit Dr. Geronimo [ideally] or nurse practitioner in 14 weeks but after PFT; 30 min

## 2023-05-13 ENCOUNTER — Ambulatory Visit: Payer: Medicare Other | Admitting: Internal Medicine

## 2023-05-13 ENCOUNTER — Encounter: Payer: Self-pay | Admitting: Internal Medicine

## 2023-05-13 VITALS — BP 152/81 | HR 69 | Ht 72.0 in | Wt 175.8 lb

## 2023-05-13 DIAGNOSIS — J432 Centrilobular emphysema: Secondary | ICD-10-CM

## 2023-05-13 DIAGNOSIS — Z5181 Encounter for therapeutic drug level monitoring: Secondary | ICD-10-CM | POA: Diagnosis not present

## 2023-05-13 DIAGNOSIS — R0902 Hypoxemia: Secondary | ICD-10-CM

## 2023-05-13 DIAGNOSIS — R0602 Shortness of breath: Secondary | ICD-10-CM

## 2023-05-13 DIAGNOSIS — J84112 Idiopathic pulmonary fibrosis: Secondary | ICD-10-CM | POA: Diagnosis not present

## 2023-05-19 ENCOUNTER — Ambulatory Visit: Payer: Medicare Other | Attending: Internal Medicine

## 2023-05-19 DIAGNOSIS — I5042 Chronic combined systolic (congestive) and diastolic (congestive) heart failure: Secondary | ICD-10-CM

## 2023-05-19 DIAGNOSIS — Z95810 Presence of automatic (implantable) cardiac defibrillator: Secondary | ICD-10-CM

## 2023-05-20 NOTE — Progress Notes (Signed)
 EPIC Encounter for ICM Monitoring  Patient Name: Luke [name] is a 78 y.o. male Date: 05/20/2023 Primary Care Physican: Cleatus Arlyss RAMAN, MD Primary Cardiologist: Skains/Sabharwal Electrophysiologist: Fernande Pore Pacing: >99%          11/12/2022 Weight: 170 lbs (baseline 174 lbs) 01/01/2023 Weight: 170.4 01/02/2023 Weight: 172.6 lbs 03/11/2023 Weight: 175 lbs 03/17/2023 Weight: 173.8 lbs 03/18/2023 Weight: 176.2 lbs 03/24/2023 Weight: 173.6 lbs 04/15/2023 Weight: 174.2 lbs 05/01/2023 Weight: 170 lbs      05/21/2023: Weight: 170 lbs                                                      Spoke with patient and heart failure questions reviewed.  Transmission results reviewed.  Pt asymptomatic for fluid accumulation.     CorVue thoracic impedance suggesting normal fluid levels with the exception of possible dryness from 12/24-1/4.   Prescribed:  Torsemide  20 mg take 1 tablet(s) (20 mg total) by mouth daily.   Labs: 01/09/2023 Creatinine 1.24, BUN 28, Potassium 4.6, Sodium 135, GFR 56.58 11/20/2022 Creatinine 1.17, BUN 23, Potassium 3.9, Sodium 135, GFR >60 11/08/2022 Creatinine 1.40, BUN 31, Potassium 5.0, Sodium 137  11/01/2022 Creatinine 1.16, BUN 23, Potassium 3.8, Sodium 139, GFR >60  10/31/2022 Creatinine 1.33, BUN 23, Potassium 3.3, Sodium 137, GFR 54  10/30/2022 Creatinine 1.50, BUN 24, Potassium 4.4, Sodium 41, GFR >60 A complete set of results can be found in Results Review.   Recommendations:  No changes and encouraged to call if experiencing any fluid symptoms.   Follow-up plan: ICM clinic phone appointment on 06/09/2023.   91 day device clinic remote transmission 07/23/2023.     EP/Cardiology Office Visits:  Recall 12/12/2023 with Dr. Fernande  Recall 01/18/2024 with Dr Jeffrie.     Copy of ICM check sent to Dr. Fernande.   3 month ICM trend: 05/19/2023.    12-14 Month ICM trend:     Mitzie RAMAN Garner, RN 05/20/2023 8:44 AM

## 2023-05-28 ENCOUNTER — Other Ambulatory Visit: Payer: Self-pay | Admitting: Family Medicine

## 2023-05-30 NOTE — Progress Notes (Signed)
Remote ICD transmission.

## 2023-06-05 ENCOUNTER — Other Ambulatory Visit: Payer: Self-pay

## 2023-06-05 ENCOUNTER — Ambulatory Visit (INDEPENDENT_AMBULATORY_CARE_PROVIDER_SITE_OTHER): Payer: Medicare Other | Admitting: Allergy & Immunology

## 2023-06-05 ENCOUNTER — Encounter: Payer: Self-pay | Admitting: Allergy & Immunology

## 2023-06-05 VITALS — BP 130/72 | HR 78 | Temp 98.1°F | Resp 16

## 2023-06-05 DIAGNOSIS — R0602 Shortness of breath: Secondary | ICD-10-CM

## 2023-06-05 DIAGNOSIS — Z888 Allergy status to other drugs, medicaments and biological substances status: Secondary | ICD-10-CM

## 2023-06-05 DIAGNOSIS — R234 Changes in skin texture: Secondary | ICD-10-CM | POA: Diagnosis not present

## 2023-06-05 DIAGNOSIS — T50905D Adverse effect of unspecified drugs, medicaments and biological substances, subsequent encounter: Secondary | ICD-10-CM

## 2023-06-05 NOTE — Progress Notes (Signed)
FOLLOW UP  Date of Service/Encounter:  06/05/23   Assessment:   Peeling skin - markedly improved (normal LFTs and inflammatory markers, ruling out DRESS)   Adverse reaction to Plavix and Brilinta and Lasix    Shortness of breath - unclear etiology, although he does have idiopathic pulmonary fibrosis (improved with prednisone, starting Trelegy today)   Stage 3 kidney disease   No atopic history   Overall, is doing much better today.  His skin is completely healed.  He does report that his shortness of breath markedly improved with the prednisone.  I asked about his history with inhalers and he has been on Incruse from Dr. Marchelle Gearing for several months.  I asked if he had ever been on Trelegy and he tells me that Dr. Marchelle Gearing had recommended it but his insurance did not cover it at that time.  This is why he is on Incruse only.  Given his improvement with the prednisone, I gave him a sample of Trelegy to trial.  He is going to let us know in a week if his breathing is not better on this.  Regarding his concern for drug allergies, I told him that the only validated way to see what might be causing any skin or shortness of breath reactions is to systematically take medications off his list and restart them.  He is going to talk to his cardiology team about this.  Plan/Recommendations:   1. Peeling skin with likely adverse drug reaction - The only way we can tell about the drugs is to take you off and put them back on systematically. - We do not have a good way to test for the medications. - I am so glad that your rash is much better.   skin is better.   2. Idiopathic pulmonary fibrosis - Sample of Trelegy provided. - Use one puff once daily. - Stop the Incruse during this time. - Send [place] a MyChart in one week to let us know how you are feeling. - Maybe the shortness of breath has nothing to do with the medications at all.   3. Return in about 6 months (around 12/03/2023). You can  have the follow up appointment with Dr. Dellis Anes or a Nurse Practicioner (our Nurse Practitioners are excellent and always have Physician oversight!).   Subjective:   Luke [name] is a 78 y.o. male presenting today for follow up of  Chief Complaint  Patient presents with   Follow-up   Rash    Clear now    Luke [name] has a history of the following: Patient Active Problem List   Diagnosis Date Noted   PAD (peripheral artery disease) (HCC) 11/19/2022   Acute on chronic systolic (congestive) heart failure (HCC) 10/29/2022   Pressure injury of skin 10/29/2022   Cellulitis 10/20/2022   Angina pectoris (HCC) 10/10/2022   CAD (coronary artery disease) 09/18/2022   CKD (chronic kidney disease) stage 3, GFR 30-59 ml/min (HCC) 09/18/2022   AKI (acute kidney injury) (HCC) 08/21/2021   Enteritis 08/21/2021   IPF (idiopathic pulmonary fibrosis) (HCC) 03/21/2021   Pain due to onychomycosis of toenails of both feet 08/10/2019   Chronic HFrEF (heart failure with reduced ejection fraction) (HCC) 06/08/2019   Biventricular ICD (implantable cardioverter-defibrillator) in place 06/08/2019   Esophageal obstruction due to food impaction    Esophageal stricture    Dysphagia 11/19/2017   Pancreatitis 11/10/2017   Tinnitus 05/23/2015   Advance care planning 10/20/2014   S/P cervical spinal fusion  06/09/2014   Stenosis of cervical spine with myelopathy (HCC) 12/27/2013   Central cord syndrome (HCC) 06/24/2013   Status post abdominal aortic aneurysm repair 12/30/2012   HLD (hyperlipidemia) 09/16/2012   SK (seborrheic keratosis) 02/12/2012   Ischemic cardiomyopathy    LBBB (left bundle branch block)    AAA (abdominal aortic aneurysm) (HCC) 10/08/2010   PVD (peripheral vascular disease) (HCC) 10/08/2010   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  Discussed the use of AI scribe software for clinical note transcription with the patient and/or guardian, who gave verbal consent to proceed.  Luke [name] is a 78 y.o. male presenting for a follow up visit.  We last saw him in December 2024.  At that time, he had a terrible rash I was desquamating on his bilateral legs.  We obtained a number of labs which were all normal, including inflammatory markers and antiskin autoantibodies.  We started triamcinolone twice daily and a low-dose prednisone taper over the course of 20 days.  We talked about doing patch testing at some point in the future.  We did follow-up about a week later with results of the labs.  His prednisone was working wonders, but it was making his blood pressure spike.  Since last visit, he has done very well.  The rash, previously severe and resembling a sunburn with peeling, has significantly improved after a 20-day course of low dose prednisone. He continues to apply triamcinolone cream once daily. He notes slight leg swelling, which he associates with not taking torasemide this morning.  He experiences shortness of breath, which improved while on prednisone. He has idiopathic pulmonary fibrosis with stable breathing test results compared to the previous year. There are conflicting opinions from his cardiologist and pulmonologist regarding the cause of his breathing issues, with one attributing it to the lungs and the other to the heart. He suspects an allergy to his blood thinner, Effient, as a potential cause of his symptoms.  He asked me today about testing for medication allergies. He has done some of this himself already, but it is hard to figure out what has been causing issues and what has not been causing issues.   He has a history of cardiac issues, including the placement of three stents on the right side and a procedure on the left side. He experienced a severe rash following these procedures, which he associates with  the use of blood thinners like Brilinta and Effient. Stopping Effient for ten days improved his breathing, but he resumed it to maintain blood flow through the stents. He is currently taking torasemide for fluid management due to fluid on the heart, which he associates with weight gain when not taken. He has a pacemaker and is able to send reports to his nurse. He follows with Dr. Gasper Lloyd and Dr. Graciela Husbands.   He wants to manage his medications better and mentions the high cost of his inhaler, Incruse, which he has recently run out of. He has not tried Trelegy due to insurance coverage issues but is open to trying it as a potential treatment for his breathing issues. Dr. Marchelle Gearing has recommended this in the past per the patient.    Otherwise, there have been no changes to his past medical history, surgical history, family history, or social history.    Review of systems otherwise negative other than that mentioned in the  HPI.    Objective:   Blood pressure 130/72, pulse 78, temperature 98.1 F (36.7 C), temperature source Temporal, resp. rate 16, SpO2 94%. There is no height or weight on file to calculate BMI.    Physical Exam Vitals reviewed.  Constitutional:      Appearance: He is well-developed.     Comments: Very talkative.  HENT:     Head: Normocephalic and atraumatic.     Right Ear: Tympanic membrane, ear canal and external ear normal. No drainage, swelling or tenderness. Tympanic membrane is not injected, scarred, erythematous, retracted or bulging.     Left Ear: Tympanic membrane, ear canal and external ear normal. No drainage, swelling or tenderness. Tympanic membrane is not injected, scarred, erythematous, retracted or bulging.     Nose: No nasal deformity, septal deviation, mucosal edema or rhinorrhea.     Right Turbinates: Enlarged, swollen and pale.     Left Turbinates: Enlarged, swollen and pale.     Right Sinus: No maxillary sinus tenderness or frontal sinus tenderness.      Left Sinus: No maxillary sinus tenderness or frontal sinus tenderness.     Comments: No nasal polyps noted.     Mouth/Throat:     Lips: Pink.     Mouth: Mucous membranes are moist. Mucous membranes are not pale and not dry.     Pharynx: Uvula midline.  Eyes:     General:        Right eye: No discharge.        Left eye: No discharge.     Conjunctiva/sclera: Conjunctivae normal.     Right eye: Right conjunctiva is not injected. No chemosis.    Left eye: Left conjunctiva is not injected. No chemosis.    Pupils: Pupils are equal, round, and reactive to light.  Cardiovascular:     Rate and Rhythm: Normal rate and regular rhythm.     Heart sounds: Normal heart sounds.  Pulmonary:     Effort: Pulmonary effort is normal. No tachypnea, accessory muscle usage or respiratory distress.     Breath sounds: Normal breath sounds. No wheezing, rhonchi or rales.     Comments: Moving air well in all lung fields. No increased work of breathing noted.  Chest:     Chest wall: No tenderness.  Abdominal:     Tenderness: There is no abdominal tenderness. There is no guarding or rebound.  Lymphadenopathy:     Head:     Right side of head: No submandibular, tonsillar or occipital adenopathy.     Left side of head: No submandibular, tonsillar or occipital adenopathy.     Cervical: No cervical adenopathy.  Skin:    General: Skin is warm.     Capillary Refill: Capillary refill takes less than 2 seconds.     Coloration: Skin is not pale.     Findings: No abrasion, erythema, petechiae or rash. Rash is not papular, urticarial or vesicular.     Comments: Skin looks much better.  Neurological:     Mental Status: He is alert.  Psychiatric:        Behavior: Behavior is cooperative.      Diagnostic studies: none      Luke Bonds, MD  [facility]

## 2023-06-05 NOTE — Patient Instructions (Addendum)
1. Peeling skin with likely adverse drug reaction - The only way we can tell about the drugs is to take you off and put them back on systematically. - We do not have a good way to test for the medications. - I am so glad that your rash is much better.   skin is better.   2. Idiopathic pulmonary fibrosis - Sample of Trelegy provided. - Use one puff once daily. - Stop the Incruse during this time. - Send [place] a MyChart in one week to let us know how you are feeling. - Maybe the shortness of breath has nothing to do with the medications at all.   3. Return in about 6 months (around 12/03/2023). You can have the follow up appointment with Dr. Dellis Anes or a Nurse Practicioner (our Nurse Practitioners are excellent and always have Physician oversight!).    Please inform us of any Emergency Department visits, hospitalizations, or changes in symptoms. Call us before going to the ED for breathing or allergy symptoms since we might be able to fit you in for a sick visit. Feel free to contact us anytime with any questions, problems, or concerns.  It was a pleasure to meet you today!  Websites that have reliable patient information: 1. American Academy of Asthma, Allergy, and Immunology: www.aaaai.org 2. Food Allergy Research and Education (FARE): foodallergy.org 3. Mothers of Asthmatics: http://www.asthmacommunitynetwork.org 4. American College of Allergy, Asthma, and Immunology: www.acaai.org      "Like" [place] on Facebook and Instagram for our latest updates!      A healthy democracy works best when [employer] participate! Make sure you are registered to vote! If you have moved or changed any of your contact information, you will need to get this updated before voting! Scan the QR codes below to learn more!

## 2023-06-06 ENCOUNTER — Ambulatory Visit: Payer: Self-pay

## 2023-06-06 NOTE — Patient Outreach (Signed)
  Care Coordination   Follow Up Visit Note   06/06/2023 Name: Luke [name] MRN: [id number] DOB: 1946/04/13  Luke [name] is a 78 y.o. year old male who sees [name], MD for primary care. I spoke with  Luke [name] by phone today.  What matters to the patients health and wellness today?  Patient states he still continues to have SOB which is his baseline. He reports the skin on his legs have healed.  He states the swelling in his legs, ankles have decreased.  He states he has gained weight a few pounds since the holidays.  Reports most recent weight gain of 3 lbs from 172 to 175 lbs in 3 days. Patient states his action plan is to call his provider office with weight increase.  Patient states his blood sugars are ranging from 70 to 100.  Denies frequent blood sugars< 70.  Patient denies changes to medications.  He states he rides his stationary bike 4 miles per day and uses his oxygen at 2L while riding.    Goals Addressed             This Visit's Progress    Continued improvement post surgery and management of health conditions       Interventions Today    Flowsheet Row Most Recent Value  Chronic Disease   Chronic disease during today's visit Congestive Heart Failure (CHF), Other  [right leg/ chin weeping/ redness/ edema, idiopathic pulmonary fibrosis.]  General Interventions   General Interventions Discussed/Reviewed Doctor Visits, General Interventions Discussed  [evaluation of current treatment plan for listed health conditions and patients adherence to plan as established by provider. Assessed for HF symptoms. Ongoing left leg symptoms and increase SOB.,]  Doctor Visits Discussed/Reviewed Doctor Visits Discussed  [Discussed pulmonary visit from 05/13/23 and allergy visit from 06/05/23. Reviewed for treatment change.  reviewed upcoming provider visits. Advised to keep follow up visits with providers as recommended.]  Exercise Interventions   Exercise Discussed/Reviewed  Exercise Reviewed  [Discussed patients current exercise regimen. Encouraged patient to continue.]  Education Interventions   Education Provided Provided Education  [Advised to continue weighing daily and monitoring oxygen level. Continue to use oxygen as needed and recommended by provider. notify provider for increase HF symptoms or skin changes to LE.]  Nutrition Interventions   Nutrition Discussed/Reviewed Nutrition Discussed  [Discussed patients usual meal plan/ food choices.  Advised to eat low carbohydrate meal, watch portion sizes and avoid sugar sweetened drinks. Inquired if patient has seen nutritionist.]  Pharmacy Interventions   Pharmacy Dicussed/Reviewed --  [medications reviewed and compliance discussed.]              SDOH assessments and interventions completed:  No     Care Coordination Interventions:  Yes, provided   Follow up plan: Follow up call scheduled for 07/04/23 at 10:45 am    Encounter Outcome:  Patient Visit Completed   George Ina RN, BSN, CCM [place]  [facility], Population Health Case Manager Phone: [phone]

## 2023-06-06 NOTE — Patient Instructions (Signed)
Visit Information  Thank you for taking time to visit with me today. Please don't hesitate to contact me if I can be of assistance to you.   Following are the goals we discussed today:   Goals Addressed             This Visit's Progress    Continued improvement post surgery and management of health conditions       Interventions Today    Flowsheet Row Most Recent Value  Chronic Disease   Chronic disease during today's visit Congestive Heart Failure (CHF), Other  [right leg/ chin weeping/ redness/ edema, idiopathic pulmonary fibrosis.]  General Interventions   General Interventions Discussed/Reviewed Doctor Visits, General Interventions Discussed  [evaluation of current treatment plan for listed health conditions and patients adherence to plan as established by provider. Assessed for HF symptoms. Ongoing left leg symptoms and increase SOB.,]  Doctor Visits Discussed/Reviewed Doctor Visits Discussed  [Discussed pulmonary visit from 05/13/23 and allergy visit from 06/05/23. Reviewed for treatment change.  reviewed upcoming provider visits. Advised to keep follow up visits with providers as recommended.]  Exercise Interventions   Exercise Discussed/Reviewed Exercise Reviewed  [Discussed patients current exercise regimen. Encouraged patient to continue.]  Education Interventions   Education Provided Provided Education  [Advised to continue weighing daily and monitoring oxygen level. Continue to use oxygen as needed and recommended by provider. notify provider for increase HF symptoms or skin changes to LE.]  Nutrition Interventions   Nutrition Discussed/Reviewed Nutrition Discussed  [Discussed patients usual meal plan/ food choices.  Advised to eat low carbohydrate meal, watch portion sizes and avoid sugar sweetened drinks. Inquired if patient has seen nutritionist.]  Pharmacy Interventions   Pharmacy Dicussed/Reviewed --  [medications reviewed and compliance discussed.]               Our next appointment is by telephone on 07/04/23 at 10:45 am  Please call the care guide team at [phone] if you need to cancel or reschedule your appointment.   If you are experiencing a Mental Health or Behavioral Health Crisis or need someone to talk to, please call the Suicide and Crisis Lifeline: 988 call 1-800-273-TALK (toll free, 24 hour hotline)  Patient verbalizes understanding of instructions and care plan provided today and agrees to view in MyChart. Active MyChart status and patient understanding of how to access instructions and care plan via MyChart confirmed with patient.     George Ina RN, BSN, CCM [employer], Population Health Case Manager Phone: [phone]

## 2023-06-09 ENCOUNTER — Ambulatory Visit: Payer: Medicare Other | Attending: Internal Medicine

## 2023-06-09 DIAGNOSIS — I5042 Chronic combined systolic (congestive) and diastolic (congestive) heart failure: Secondary | ICD-10-CM

## 2023-06-09 DIAGNOSIS — Z95810 Presence of automatic (implantable) cardiac defibrillator: Secondary | ICD-10-CM

## 2023-06-11 ENCOUNTER — Telehealth: Payer: Self-pay

## 2023-06-11 NOTE — Telephone Encounter (Signed)
 Remote ICM transmission received.  Attempted call to patient regarding ICM remote transmission and left detailed message per DPR.  Left ICM phone number and advised to return call for any fluid symptoms or questions. Next ICM remote transmission scheduled 07/14/2023.

## 2023-06-16 ENCOUNTER — Encounter: Payer: Self-pay | Admitting: Allergy & Immunology

## 2023-06-17 ENCOUNTER — Ambulatory Visit: Payer: Medicare Other | Attending: Internal Medicine

## 2023-06-17 DIAGNOSIS — I5042 Chronic combined systolic (congestive) and diastolic (congestive) heart failure: Secondary | ICD-10-CM

## 2023-06-17 DIAGNOSIS — Z95810 Presence of automatic (implantable) cardiac defibrillator: Secondary | ICD-10-CM

## 2023-06-17 NOTE — Progress Notes (Signed)
EPIC Encounter for ICM Monitoring  Patient Name: Luke [name] is a 78 y.o. male Date: 06/17/2023 Primary Care Physican: [name], MD Primary Cardiologist: Skains/Sabharwal Electrophysiologist: Joycelyn Schmid Pacing: >99%          11/12/2022 Weight: 170 lbs (baseline 174 lbs) 01/01/2023 Weight: 170.4 01/02/2023 Weight: 172.6 lbs 03/11/2023 Weight: 175 lbs 03/17/2023 Weight: 173.8 lbs 03/18/2023 Weight: 176.2 lbs 03/24/2023 Weight: 173.6 lbs 04/15/2023 Weight: 174.2 lbs 05/01/2023 Weight: 170 lbs      05/21/2023: Weight: 170 lbs    06/17/2023 Weight: 179 lbs                                                    Spoke with patient and heart failure questions reviewed.  Transmission results reviewed.  Pt reports weight gain of 5 lbs within the last week.   CorVue thoracic impedance suggesting possible fluid accumulation starting 2/7 which correlates with weight gain.   Prescribed:  Torsemide 20 mg take 1 tablet(s) (20 mg total) by mouth daily.  06/17/23 He reports Dr Gasper Lloyd discussed with him to take extra Torsemide x 2 days only if needed for fluid symptoms.   Labs: 04/24/2023 Creatinine 1.33, BUN 37, Potassium 5.5, Sodium 139, GFR 55 01/09/2023 Creatinine 1.24, BUN 28, Potassium 4.6, Sodium 135, GFR 56.58 A complete set of results can be found in Results Review.   Recommendations:  Pt self adjusts Torsemide and taking extra 20 mg today and tomorrow.   Sent to Dr Gasper Lloyd as Lorain Childes of patient's plan.    Follow-up plan: ICM clinic phone appointment on 06/20/2023 to recheck fluid levels.   91 day device clinic remote transmission 07/23/2023.     EP/Cardiology Office Visits:  Recall 12/12/2023 with Dr. Graciela Husbands  Recall 01/18/2024 with Dr Anne Fu.     Copy of ICM check sent to Dr. Graciela Husbands.   3 month ICM trend: 06/17/2023.    12-14 Month ICM trend:     Karie Soda, RN 06/17/2023 12:06 PM

## 2023-06-20 ENCOUNTER — Ambulatory Visit (INDEPENDENT_AMBULATORY_CARE_PROVIDER_SITE_OTHER): Payer: Medicare Other

## 2023-06-20 DIAGNOSIS — I5042 Chronic combined systolic (congestive) and diastolic (congestive) heart failure: Secondary | ICD-10-CM

## 2023-06-20 DIAGNOSIS — Z95810 Presence of automatic (implantable) cardiac defibrillator: Secondary | ICD-10-CM

## 2023-06-20 MED ORDER — TRELEGY ELLIPTA 100-62.5-25 MCG/ACT IN AEPB: 1.0000 | INHALATION_SPRAY | Freq: Every day | RESPIRATORY_TRACT | 5 refills | Status: DC

## 2023-06-20 NOTE — Progress Notes (Signed)
EPIC Encounter for ICM Monitoring  Patient Name: Luke [name] is a 78 y.o. male Date: 06/20/2023 Primary Care Physican: [name], MD Primary Cardiologist: Skains/Sabharwal Electrophysiologist: Joycelyn Schmid Pacing: >99%          11/12/2022 Weight: 170 lbs (baseline 174 lbs) 01/01/2023 Weight: 170.4 01/02/2023 Weight: 172.6 lbs 03/11/2023 Weight: 175 lbs 03/17/2023 Weight: 173.8 lbs 03/18/2023 Weight: 176.2 lbs 03/24/2023 Weight: 173.6 lbs 04/15/2023 Weight: 174.2 lbs 05/01/2023 Weight: 170 lbs      05/21/2023: Weight: 170 lbs    06/17/2023 Weight: 179 lbs         06/20/2023 Weight: 175.5 lbs                                         Spoke with patient and heart failure questions reviewed.  Transmission results reviewed.  Pt reports weight loss after taking extra Torsemide.    CorVue thoracic impedance suggesting possible fluid accumulation starting 2/7 which correlates with weight gain.   Prescribed:  Torsemide 20 mg take 1 tablet(s) (20 mg total) by mouth daily.  06/17/23 He reports Dr Gasper Lloyd discussed with him to take extra Torsemide x 2 days only if needed for fluid symptoms.   Labs: 04/24/2023 Creatinine 1.33, BUN 37, Potassium 5.5, Sodium 139, GFR 55 01/09/2023 Creatinine 1.24, BUN 28, Potassium 4.6, Sodium 135, GFR 56.58 A complete set of results can be found in Results Review.   Recommendations: No changes and encouraged to call if experiencing any fluid symptoms.   Follow-up plan: ICM clinic phone appointment on 07/28/2023.   91 day device clinic remote transmission 07/23/2023.     EP/Cardiology Office Visits:  Recall 12/12/2023 with Dr. Graciela Husbands  Recall 01/18/2024 with Dr Anne Fu.     Copy of ICM check sent to Dr. Graciela Husbands.   3 month ICM trend: 06/20/2023.    12-14 Month ICM trend:     Karie Soda, RN 06/20/2023 2:13 PM

## 2023-06-24 ENCOUNTER — Other Ambulatory Visit (HOSPITAL_BASED_OUTPATIENT_CLINIC_OR_DEPARTMENT_OTHER): Payer: Self-pay | Admitting: Family

## 2023-06-24 DIAGNOSIS — I25118 Atherosclerotic heart disease of native coronary artery with other forms of angina pectoris: Secondary | ICD-10-CM

## 2023-07-04 ENCOUNTER — Telehealth: Payer: Self-pay

## 2023-07-04 ENCOUNTER — Ambulatory Visit: Payer: Self-pay

## 2023-07-04 NOTE — Telephone Encounter (Signed)
 Spoke to pt. Received 3 boxes of Lantus from patient assistance program. Placed in middle fridge in med area.

## 2023-07-04 NOTE — Patient Instructions (Signed)
 Visit Information  Thank you for taking time to visit with me today. Please don't hesitate to contact me if I can be of assistance to you.   Following are the goals we discussed today:   Goals Addressed             This Visit's Progress    Continued improvement post surgery and management of health conditions       Interventions Today    Flowsheet Row Most Recent Value  Chronic Disease   Chronic disease during today's visit Congestive Heart Failure (CHF), Other  [right leg/ chin weeping/ redness/ edema, idiopathic pulmonary fibrosis.Assessed for rt leg weeping/ rash / signs of skin infection]  General Interventions   General Interventions Discussed/Reviewed General Interventions Reviewed, Doctor Visits  [evaluation of current treatment plan for listed health conditions and patient adherence to plan as established by provider. Asssessed for HF/ Pulmonary symptoms.]  Doctor Visits Discussed/Reviewed Doctor Visits Reviewed  [reviewed upcoming provider visits. Advised to keep follow up visit with providers as recommended.]  Exercise Interventions   Exercise Discussed/Reviewed Physical Activity  [encouraged ongoing physical activity.]  Education Interventions   Education Provided Provided Education  [Advised ongoing monitoring of weight daily, oxygen level, and signs of LE swelling, skin symptoms,  increase SOB and chest pain. Advised to notify provider for increase in symptoms or call 911 for severe symptoms.]  Pharmacy Interventions   Pharmacy Dicussed/Reviewed Pharmacy Topics Reviewed  [medications reviewed and compliance discussed.]              Our next appointment is by telephone on 08/19/23 at 10:45 am  Please call the care guide team at [phone] if you need to cancel or reschedule your appointment.   If you are experiencing a Mental Health or Behavioral Health Crisis or need someone to talk to, please call the Suicide and Crisis Lifeline: 988 call 1-800-273-TALK (toll  free, 24 hour hotline)  Patient verbalizes understanding of instructions and care plan provided today and agrees to view in MyChart. Active MyChart status and patient understanding of how to access instructions and care plan via MyChart confirmed with patient.     George Ina RN, BSN, CCM [employer], Population Health Case Manager Phone: [phone]

## 2023-07-04 NOTE — Patient Outreach (Signed)
 Care Coordination   Follow Up Visit Note   07/04/2023 Name: Luke [name] MRN: [id number] DOB: [birth year]  Luke [name] is a 78 y.o. year old male who sees [name], MD for primary care. I spoke with  Luke [name] by phone today.  What matters to the patients health and wellness today?  Patient states he still has shortness of breath.  Patient states he uses his oxygen as needed. Reports oxygen saturation level are mid to high 90's.  He states when he gets very  short of breath he has chest pain.  Patient states his doctors are aware. Patient states skin condition on legs have healed however he is having itching to legs.  He reports using prescribed cream to legs. Patient states he is gaining weight but he feels it is more of a natural weight gain and not overnight weight gain.  Reports today's weight is 176 lbs. Patient states he continues to ride stationary bike for 3 miles daily.  Patient denies medication changes.      Goals Addressed             This Visit's Progress    Continued improvement post surgery and management of health conditions       Interventions Today    Flowsheet Row Most Recent Value  Chronic Disease   Chronic disease during today's visit Congestive Heart Failure (CHF), Other  [right leg/ chin weeping/ redness/ edema, idiopathic pulmonary fibrosis.Assessed for rt leg weeping/ rash / signs of skin infection]  General Interventions   General Interventions Discussed/Reviewed General Interventions Reviewed, Doctor Visits  [evaluation of current treatment plan for listed health conditions and patient adherence to plan as established by provider. Asssessed for HF/ Pulmonary symptoms.]  Doctor Visits Discussed/Reviewed Doctor Visits Reviewed  [reviewed upcoming provider visits. Advised to keep follow up visit with providers as recommended.]  Exercise Interventions   Exercise Discussed/Reviewed Physical Activity  [encouraged ongoing physical activity.]   Education Interventions   Education Provided Provided Education  [Advised ongoing monitoring of weight daily, oxygen level, and signs of LE swelling, skin symptoms,  increase SOB and chest pain. Advised to notify provider for increase in symptoms or call 911 for severe symptoms.]  Pharmacy Interventions   Pharmacy Dicussed/Reviewed Pharmacy Topics Reviewed  [medications reviewed and compliance discussed.]              SDOH assessments and interventions completed:  No     Care Coordination Interventions:  Yes, provided   Follow up plan: Follow up call scheduled for 08/19/23 at 10:45 am    Encounter Outcome:  Patient Visit Completed   George Ina RN, BSN, CCM [place]  [facility], Population Health Case Manager Phone: [phone]

## 2023-07-08 NOTE — Telephone Encounter (Signed)
 Patient picked up medication

## 2023-07-09 ENCOUNTER — Telehealth: Payer: Self-pay

## 2023-07-09 NOTE — Telephone Encounter (Signed)
 Spoke with patient to advise that we received 3 Lantus that are available for pickup

## 2023-07-10 NOTE — Telephone Encounter (Signed)
 Patient picked up medication

## 2023-07-21 ENCOUNTER — Ambulatory Visit: Payer: Medicare Other | Admitting: Podiatry

## 2023-07-23 ENCOUNTER — Ambulatory Visit (INDEPENDENT_AMBULATORY_CARE_PROVIDER_SITE_OTHER): Payer: Medicare Other

## 2023-07-23 DIAGNOSIS — I255 Ischemic cardiomyopathy: Secondary | ICD-10-CM

## 2023-07-24 LAB — CUP PACEART REMOTE DEVICE CHECK
Battery Remaining Longevity: 54 mo
Battery Remaining Percentage: 67 %
Battery Voltage: 2.95 V
Brady Statistic AP VP Percent: 12 %
Brady Statistic AP VS Percent: 1 %
Brady Statistic AS VP Percent: 87 %
Brady Statistic AS VS Percent: 1 %
Brady Statistic RA Percent Paced: 12 %
Date Time Interrogation Session: 20250319021433
HighPow Impedance: 73 Ohm
Implantable Lead Connection Status: 753985
Implantable Lead Connection Status: 753985
Implantable Lead Connection Status: 753985
Implantable Lead Implant Date: 20140313
Implantable Lead Implant Date: 20140313
Implantable Lead Implant Date: 20140313
Implantable Lead Location: 753858
Implantable Lead Location: 753859
Implantable Lead Location: 753860
Implantable Pulse Generator Implant Date: 20220622
Lead Channel Impedance Value: 380 Ohm
Lead Channel Impedance Value: 390 Ohm
Lead Channel Impedance Value: 750 Ohm
Lead Channel Pacing Threshold Amplitude: 0.75 V
Lead Channel Pacing Threshold Amplitude: 0.875 V
Lead Channel Pacing Threshold Amplitude: 2.125 V
Lead Channel Pacing Threshold Pulse Width: 0.5 ms
Lead Channel Pacing Threshold Pulse Width: 0.5 ms
Lead Channel Pacing Threshold Pulse Width: 0.6 ms
Lead Channel Sensing Intrinsic Amplitude: 12 mV
Lead Channel Sensing Intrinsic Amplitude: 2.5 mV
Lead Channel Setting Pacing Amplitude: 1.875
Lead Channel Setting Pacing Amplitude: 2 V
Lead Channel Setting Pacing Amplitude: 2.625
Lead Channel Setting Pacing Pulse Width: 0.5 ms
Lead Channel Setting Pacing Pulse Width: 0.6 ms
Lead Channel Setting Sensing Sensitivity: 0.5 mV
Pulse Gen Serial Number: 810030647

## 2023-07-28 ENCOUNTER — Other Ambulatory Visit: Payer: Self-pay | Admitting: Allergy & Immunology

## 2023-07-28 ENCOUNTER — Ambulatory Visit: Payer: Medicare Other | Attending: Internal Medicine

## 2023-07-28 ENCOUNTER — Ambulatory Visit: Payer: Medicare Other | Admitting: Podiatry

## 2023-07-28 DIAGNOSIS — I5042 Chronic combined systolic (congestive) and diastolic (congestive) heart failure: Secondary | ICD-10-CM | POA: Diagnosis not present

## 2023-07-28 DIAGNOSIS — Z95810 Presence of automatic (implantable) cardiac defibrillator: Secondary | ICD-10-CM

## 2023-07-30 NOTE — Progress Notes (Signed)
 EPIC Encounter for ICM Monitoring  Patient Name: Luke [name] is a 78 y.o. male Date: 07/30/2023 Primary Care Physican: [name], MD Primary Cardiologist: Skains/Sabharwal (as needed) Electrophysiologist: Joycelyn Schmid Pacing: >99%          03/24/2023 Weight: 173.6 lbs 04/15/2023 Weight: 174.2 lbs 05/01/2023 Weight: 170 lbs      05/21/2023: Weight: 170 lbs    06/17/2023 Weight: 179 lbs         06/20/2023 Weight: 175.5 lbs      07/30/2023 Weight: 175-178 lbs                                    Spoke with patient and heart failure questions reviewed.  Transmission results reviewed.  Pt asymptomatic for fluid accumulation.  Chronically SOB but no worse.     CorVue thoracic impedance suggesting normal fluid levels with the exception of possible fluid accumulation starting 3/14-3/17 and 3/19-3/22.   Prescribed:  Torsemide 20 mg take 1 tablet(s) (20 mg total) by mouth daily.  06/17/23 He reports Dr Gasper Lloyd discussed with him to take extra Torsemide x 2 days only if needed for fluid symptoms.   Labs: 04/24/2023 Creatinine 1.33, BUN 37, Potassium 5.5, Sodium 139, GFR 55 01/09/2023 Creatinine 1.24, BUN 28, Potassium 4.6, Sodium 135, GFR 56.58 A complete set of results can be found in Results Review.   Recommendations: No changes and encouraged to call if experiencing any fluid symptoms.   Follow-up plan: ICM clinic phone appointment on 09/01/2023.   91 day device clinic remote transmission 10/22/2023.     EP/Cardiology Office Visits:  Recall 12/12/2023 with Dr. Graciela Husbands  Recall 01/18/2024 with Dr Anne Fu.     Copy of ICM check sent to Dr. Graciela Husbands.   3 month ICM trend: 07/28/2023.    12-14 Month ICM trend:     Karie Soda, RN 07/30/2023 1:56 PM

## 2023-08-06 ENCOUNTER — Telehealth: Payer: Self-pay | Admitting: Cardiology

## 2023-08-06 NOTE — Telephone Encounter (Signed)
 Pt c/o Shortness Of Breath: STAT if SOB developed within the last 24 hours or pt is noticeably SOB on the phone  1. Are you currently SOB (can you hear that pt is SOB on the phone)? States yes but cannot hear over the phone  2. How long have you been experiencing SOB? Since last fall, states he was told by Dr. Anne Fu that it is his lungs but his pulmonary doctor doesn't think it is his lungs. States it is getting worse  3. Are you SOB when sitting or when up moving around? When he exerts himself  4. Are you currently experiencing any other symptoms? Chest tightness, rash that comes and goes on legs, arms and torso, swelling in right leg    Pt c/o of Chest Pain: STAT if active CP, including tightness, pressure, jaw pain, radiating pain to shoulder/upper arm/back, CP unrelieved by Nitro. Symptoms reported of SOB, nausea, vomiting, sweating.  1. Are you having CP right now? No, only when he exerts himself or under stress    2. Are you experiencing any other symptoms (ex. SOB, nausea, vomiting, sweating)? See symptoms from above   3. Is your CP continuous or coming and going? Coming and going   4. Have you taken Nitroglycerin? no   5. How long have you been experiencing CP? Same as SOB, last fall    6. If NO CP at time of call then end call with telling Pt to call back or call 911 if Chest pain returns prior to return call from triage team.   Patient scheduled to see Gillian Shields on 04/18

## 2023-08-06 NOTE — Telephone Encounter (Signed)
 I spoke with patient.  He reports his biggest issue is shortness of breath. Has been going on for awhile but seems to be worse in the last 2 weeks.  Has chest pain at times with exertion but it is difficult for him to describe.  Patient has appointment with Brunetta Genera, NP on 4/18.  I offered appointment with Dr Anne Fu tomorrow but patient feels he is fine to wait until scheduled appointment on 4/18.

## 2023-08-07 NOTE — Telephone Encounter (Signed)
  Shortness of breath is from lung disease mostly. Recent CT scan: Lungs/Pleura: Severe centrilobular and paraseptal emphysema.  Please be sure to address with pulmonary team.  Thanks.   Spoke with pt who is aware of the above information.  Pt reports he feels his symptoms are from his heart and not his lungs because his friend had symptoms like his and she had a heart attack.  He is currently not having any chest pain/discomfort but continues to have shortness of breath.  Advised I will forward this to Dr Anne Fu for his knowledge.  Pt is aware to reach out to pulmonary and also keep his appt as scheduled with Hubbard Hartshorn as scheduled on 08/22/23.

## 2023-08-11 ENCOUNTER — Encounter: Payer: Self-pay | Admitting: Podiatry

## 2023-08-11 ENCOUNTER — Ambulatory Visit (INDEPENDENT_AMBULATORY_CARE_PROVIDER_SITE_OTHER): Admitting: Podiatry

## 2023-08-11 DIAGNOSIS — M79674 Pain in right toe(s): Secondary | ICD-10-CM | POA: Diagnosis not present

## 2023-08-11 DIAGNOSIS — B351 Tinea unguium: Secondary | ICD-10-CM

## 2023-08-11 DIAGNOSIS — M79675 Pain in left toe(s): Secondary | ICD-10-CM | POA: Diagnosis not present

## 2023-08-11 DIAGNOSIS — E119 Type 2 diabetes mellitus without complications: Secondary | ICD-10-CM

## 2023-08-11 NOTE — Progress Notes (Unsigned)
 Chief Complaint  Patient presents with   Diabetes    Patient is here for routine [facility]    Subjective:  78 y.o. male with PMHx of diabetes mellitus presenting today for recurrence of the ulcer to the plantar aspect of the first MTP of the right foot.  Patient states that he has been dealing with a callus/ulcer to the area since 2018.  Patient states that today he is doing very well.  Since last visit he has had cardiac stents and lower extremity angiogram procedures performed.  He says that being off of his feet is helped his wounds significantly.  Patient also requesting nail debridement today.  He says that his nails are long and tender especially in his close toed shoes.  He is unable to trim his own toenails.   Past Medical History:  Diagnosis Date   AAA (abdominal aortic aneurysm) (HCC) 2011   Per vascular surgery   Angio-edema    Atrial fibrillation (HCC)    CAD (coronary artery disease)    Presumed CAD with nuclear scan October 09, 2011,  large anteroseptal MI and inferior MI. Catheterization scheduled October 15, 2011   Cardiomyopathy [facility])    Nuclear, October 09, 2011, EF 30%, multiple focal wall motion abnormalities   Chronic kidney disease    CKD3   COVID-19    2021   Diabetes mellitus    type II   GERD (gastroesophageal reflux disease)    Silent   HLD (hyperlipidemia)    Hypertension    white coat HTN-- often elevated in office and controlled on outside checks.   ICD (implantable cardioverter-defibrillator) in place    CRT-D placed March, 2014 complete heart block and k dysfunction   IPF (idiopathic pulmonary fibrosis) (HCC)    LBBB (left bundle branch block)    LBBB on EKG October 11, 2011,  no prior EKG has been done   Low testosterone    Hx of   On home oxygen therapy    2L as needed   Pacemaker    PAD (peripheral artery disease) (HCC)    Pancreatitis    Peripheral arterial disease (HCC)    Umbilical hernia     Past Surgical History:  Procedure Laterality Date    ABDOMINAL AORTAGRAM N/A 10/28/2011   Procedure: ABDOMINAL Ronny Flurry;  Surgeon: [name], MD;  Location: [facility] CATH LAB;  Service: Cardiovascular;  Laterality: N/A;   ABDOMINAL AORTIC ANEURYSM REPAIR     EVAR    ABDOMINAL AORTOGRAM N/A 05/27/2022   Procedure: ABDOMINAL AORTOGRAM;  Surgeon: Maeola Harman, MD;  Location: [facility] INVASIVE CV LAB;  Service: Cardiovascular;  Laterality: N/A;   BI-VENTRICULAR IMPLANTABLE CARDIOVERTER DEFIBRILLATOR N/A 07/16/2012   Procedure: BI-VENTRICULAR IMPLANTABLE CARDIOVERTER DEFIBRILLATOR  (CRT-D);  Surgeon: Duke Salvia, MD;  Location: [facility] CATH LAB;  Service: Cardiovascular;  Laterality: N/A;   BIV ICD GENERATOR CHANGEOUT N/A 10/25/2020   Procedure: BIV ICD GENERATOR CHANGEOUT;  Surgeon: Duke Salvia, MD;  Location: [facility] INVASIVE CV LAB;  Service: Cardiovascular;  Laterality: N/A;   CARDIAC CATHETERIZATION     CATARACT EXTRACTION Left 2023   COLONOSCOPY  03/23/2018; 2020   CORONARY CTO INTERVENTION N/A 10/10/2022   Procedure: CORONARY CTO INTERVENTION;  Surgeon: Corky Crafts, MD;  Location: [facility] INVASIVE CV LAB;  Service: Cardiovascular;  Laterality: N/A;   CORONARY LITHOTRIPSY N/A 09/18/2022   Procedure: CORONARY LITHOTRIPSY;  Surgeon: Corky Crafts, MD;  Location: [facility] INVASIVE CV LAB;  Service: Cardiovascular;  Laterality: N/A;  CORONARY STENT INTERVENTION N/A 09/18/2022   Procedure: CORONARY STENT INTERVENTION;  Surgeon: Corky Crafts, MD;  Location: [facility] INVASIVE CV LAB;  Service: Cardiovascular;  Laterality: N/A;   CORONARY ULTRASOUND/IVUS N/A 09/18/2022   Procedure: Coronary Ultrasound/IVUS;  Surgeon: Corky Crafts, MD;  Location: [facility] INVASIVE CV LAB;  Service: Cardiovascular;  Laterality: N/A;   ENDARTERECTOMY FEMORAL Right 11/19/2022   Procedure: RIGHT COMMON FEMORAL ENDARTERECTOMY WITH VEIN PATCH ANGIOPLASTY;  Surgeon: Maeola Harman, MD;  Location: [facility] OR;  Service: Vascular;  Laterality: Right;    ESOPHAGOGASTRODUODENOSCOPY N/A 02/12/2019   Procedure: ESOPHAGOGASTRODUODENOSCOPY (EGD);  Surgeon: Lynann Bologna, MD;  Location: [facility] ENDOSCOPY;  Service: Endoscopy;  Laterality: N/A;   ESOPHAGOGASTRODUODENOSCOPY (EGD) WITH PROPOFOL N/A 02/05/2018   Procedure: ESOPHAGOGASTRODUODENOSCOPY (EGD) WITH PROPOFOL;  Surgeon: Napoleon Form, MD;  Location: [facility] ENDOSCOPY;  Service: Endoscopy;  Laterality: N/A;   ESOPHAGOGASTRODUODENOSCOPY (EGD) WITH PROPOFOL N/A 09/27/2018   Procedure: ESOPHAGOGASTRODUODENOSCOPY (EGD) WITH PROPOFOL;  Surgeon: Jeani Hawking, MD;  Location: [facility] ENDOSCOPY;  Service: Endoscopy;  Laterality: N/A;   FOREIGN BODY REMOVAL  09/27/2018   Procedure: FOREIGN BODY REMOVAL;  Surgeon: Jeani Hawking, MD;  Location: [facility] ENDOSCOPY;  Service: Endoscopy;;   FOREIGN BODY REMOVAL  02/12/2019   Procedure: FOREIGN BODY REMOVAL;  Surgeon: Lynann Bologna, MD;  Location: [facility] ENDOSCOPY;  Service: Endoscopy;;   HERNIA REPAIR  05/14/2013   INSERTION OF MESH N/A 05/14/2013   Procedure: INSERTION OF MESH;  Surgeon: Ardeth Sportsman, MD;  Location: [facility] OR;  Service: General;  Laterality: N/A;   LEFT HEART CATH N/A 10/10/2022   Procedure: Left Heart Cath;  Surgeon: Corky Crafts, MD;  Location: [facility] INVASIVE CV LAB;  Service: Cardiovascular;  Laterality: N/A;   LEFT HEART CATH AND CORONARY ANGIOGRAPHY N/A 07/17/2021   Procedure: LEFT HEART CATH AND CORONARY ANGIOGRAPHY;  Surgeon: Lennette Bihari, MD;  Location: [facility] INVASIVE CV LAB;  Service: Cardiovascular;  Laterality: N/A;   LEFT HEART CATH AND CORONARY ANGIOGRAPHY N/A 09/18/2022   Procedure: LEFT HEART CATH AND CORONARY ANGIOGRAPHY;  Surgeon: Corky Crafts, MD;  Location: [facility] INVASIVE CV LAB;  Service: Cardiovascular;  Laterality: N/A;   LOWER EXTREMITY ANGIOGRAM Bilateral 10/28/2011   Procedure: LOWER EXTREMITY ANGIOGRAM;  Surgeon: [name], MD;  Location: [facility] CATH LAB;  Service: Cardiovascular;  Laterality: Bilateral;   LOWER EXTREMITY  ANGIOGRAM Right 11/19/2022   Procedure: RIGHT LOWER EXTREMITY ANGIOGRAM;  Surgeon: Maeola Harman, MD;  Location: [facility] OR;  Service: Vascular;  Laterality: Right;   LOWER EXTREMITY ANGIOGRAPHY  05/27/2022   Procedure: Lower Extremity Angiography;  Surgeon: Maeola Harman, MD;  Location: [facility] INVASIVE CV LAB;  Service: Cardiovascular;;   PACEMAKER INSERTION  07/16/2012   pacemaker/defibrilator   PATCH ANGIOPLASTY Right 11/19/2022   Procedure: VEIN PATCH ANGIOPLASTY;  Surgeon: Maeola Harman, MD;  Location: [facility] OR;  Service: Vascular;  Laterality: Right;   POLYPECTOMY     POSTERIOR CERVICAL FUSION/FORAMINOTOMY N/A 06/09/2014   Procedure: Laminectomy - Cervical two-Cervcial four posterior cervical instrumented fusion Cervical two-cervical four;  Surgeon: Tia Alert, MD;  Location: [facility] NEURO ORS;  Service: Neurosurgery;  Laterality: N/A;  posterior    SAVORY DILATION N/A 02/05/2018   Procedure: SAVORY DILATION;  Surgeon: Napoleon Form, MD;  Location: [facility] ENDOSCOPY;  Service: Endoscopy;  Laterality: N/A;   TONSILLECTOMY     as a child    UMBILICAL HERNIA REPAIR N/A 05/14/2013   Procedure: LAPAROSCOPIC exploration and repair of hernia in abdominal ;  Surgeon:  Ardeth Sportsman, MD;  Location: [facility] OR;  Service: General;  Laterality: N/A;   UPPER GASTROINTESTINAL ENDOSCOPY  2020    Allergies  Allergen Reactions   Aldactone [Spironolactone] Other (See Comments)    Hyperkalemia   Brilinta [Ticagrelor] Shortness Of Breath    Numbness in hands and feet Blurry vision   Clopidogrel Rash    Redness and Itchiness   Codeine Rash and Hives   Amoxicillin Hives   Atorvastatin     Myalgias with lipitor.  Does tolerate simvastatin.     Benadryl [Diphenhydramine] Hives   Januvia [Sitagliptin] Other (See Comments)    Diarrhea and heart racing   Jardiance [Empagliflozin] Other (See Comments)    Polyuria; excessive weight loss   Lisinopril     Possible cause of  pancreatitis   Rosuvastatin Other (See Comments)    myalgia   Lasix [Furosemide] Rash     B/L legs 04/21/2023  Objective/Physical Exam General: The patient is alert and oriented x3 in no acute distress.  Dermatology:  Superficial breakdown of the skin noted diffusely throughout the entire foot and leg right lower extremity beginning just below the level of the knee.  Please see above noted photo.  Serous drainage noted.  Skin is cool to touch  Vascular: Lower extremity angiography 05/27/2022 performed by Dr. Lemar Livings, vein and vascular.  Please see report.  Being closely monitored with vascular  Increased redness noted to the right lower extremity  Neurological: Light touch and protective threshold diminished bilaterally.   Musculoskeletal Exam: No pedal deformity.  No prior amputations.  Muscle strength 5/5 all compartments  Radiographic exam RT foot 08/12/2022: Mostly unchanged from prior x-rays.  Stable.  No radiolucency or gas within the tissues and does not present on prior x-rays.  No radiographic evidence of obvious osseous erosions or signs of osteomyelitis  Assessment: 1.  Pain due to onychomycosis of toenails both 2. diabetes mellitus w/ peripheral neuropathy 3. Ulcer third digit right foot 4. Edema RLE  Plan of Care:  -Patient evaluated.  -Mechanical debridement of nails 1-5 bilateral was performed using a nail nipper without incident or bleeding -Concerning the right lower extremity, the patient has been on oral ciprofloxacin with improvement.  He has an appointment this upcoming Wednesday, 04/23/2023 with Dr. Randie Heinz, [facility]. -Return to clinic 3 months  Felecia Shelling, DPM [facility]  Dr. Felecia Shelling, DPM    [address] [place], [place] [address]                Office [phone]  Fax [phone]

## 2023-08-14 ENCOUNTER — Other Ambulatory Visit: Payer: Self-pay | Admitting: Gastroenterology

## 2023-08-16 ENCOUNTER — Encounter: Payer: Self-pay | Admitting: Internal Medicine

## 2023-08-19 ENCOUNTER — Ambulatory Visit: Payer: Self-pay

## 2023-08-19 NOTE — Patient Outreach (Signed)
 Complex Care Management   Visit Note  08/19/2023  Name:  Luke [name] MRN: [id number] DOB: 1946-01-20  Situation: Referral received for Complex Care Management related to Heart Failure and right leg rash  I obtained verbal consent from Patient.  Visit completed with patient  on the phone  Background:   Past Medical History:  Diagnosis Date   AAA (abdominal aortic aneurysm) (HCC) 2011   Per vascular surgery   Angio-edema    Atrial fibrillation (HCC)    CAD (coronary artery disease)    Presumed CAD with nuclear scan October 09, 2011,  large anteroseptal MI and inferior MI. Catheterization scheduled October 15, 2011   Cardiomyopathy [facility])    Nuclear, October 09, 2011, EF 30%, multiple focal wall motion abnormalities   Chronic kidney disease    CKD3   COVID-19    2021   Diabetes mellitus    type II   GERD (gastroesophageal reflux disease)    Silent   HLD (hyperlipidemia)    Hypertension    white coat HTN-- often elevated in office and controlled on outside checks.   ICD (implantable cardioverter-defibrillator) in place    CRT-D placed March, 2014 complete heart block and k dysfunction   IPF (idiopathic pulmonary fibrosis) (HCC)    LBBB (left bundle branch block)    LBBB on EKG October 11, 2011,  no prior EKG has been done   Low testosterone    Hx of   On home oxygen therapy    2L as needed   Pacemaker    PAD (peripheral artery disease) (HCC)    Pancreatitis    Peripheral arterial disease (HCC)    Umbilical hernia     Assessment: Patient Reported Symptoms:  Cognitive Cognitive Status: Alert and oriented to person, place, and time, Insightful and able to interpret abstract concepts, Normal speech and language skills Cognitive/Intellectual Conditions Management [RPT]: None reported or documented in medical history or problem list   Health Maintenance Behaviors: Annual physical exam Healing Pattern: Average Health Facilitated by:  ("overall management of health, routine follow up  and medications.")  Neurological      HEENT HEENT Symptoms Reported: Not assessed      Cardiovascular Cardiovascular Symptoms Reported: Swelling in legs or feet Does patient have uncontrolled Hypertension?: No Cardiovascular Conditions: Heart failure Cardiovascular Management Strategies: Medication therapy, Routine screening, Diet modification (Patient states he is taking only 1/2 of his effient pill daily  because it causes a rash on his legs.  He states he has not reported this to his cardiologist however he plans to notify her at his appointment on 08/22/23.) Weight: 177 lb (80.3 kg) Cardiovascular Self-Management Outcome: 4 (good) Cardiovascular Comment: patient reports right leg and foot swelling. He states his leg usually goes down overnight however his foot stays mildly swollen due to neuropathy.  Patient states this is baseline for him.  He states he is aware of heart failure symptoms and knows which doctor to contact for increase symptoms. Patient denies any new or concerning symptoms. Patient states his next cardiology visit is 08/22/23.  Respiratory Respiratory Symptoms Reported: Shortness of breath Respiratory Conditions: COPD Respiratory Self-Management Outcome: 3 (uncertain) Respiratory Comment: Patient states he continues to have shortness of breath. He states this is unchanged and he continues to adhere to current treatment plan.   He reports ongoing follow up with cardiology and pulmonologist regarding this. Patient reports ongoing O2 use at 2L at HS and during the day as needed. He states his next follow  up visit with pulmonologist in 11/2023. Reports O2 sats range from 93-98.  Patient states he is not currently taking his incruse due to cost however he has recently met his medication deductible and will follolw up with practice pharmacist for medication assistance. He states the pharmacist has helped him with this before.  Endocrine Patient reports the following symptoms related to  hypoglycemia or hyperglycemia : Not assessed    Gastrointestinal Gastrointestinal Symptoms Reported: Not assessed      Genitourinary      Integumentary Integumentary Symptoms Reported: No symptoms reported Skin Comment: patient states his right leg rash, skin weeping has cleared up.  He reports no longer having issues with this at this time.  Musculoskeletal Musculoskelatal Symptoms Reviewed: Not assessed        Psychosocial Psychosocial Symptoms Reported: Not assessed            04/24/2023   11:00 AM  Depression screen PHQ 2/9  Decreased Interest 0  Down, Depressed, Hopeless 0  PHQ - 2 Score 0  Difficult doing work/chores Not difficult at all    Vitals:   08/19/23 1033  BP: 123/70  SpO2: 95%    Medications Reviewed Today     Reviewed by Otho Ket, RN (Registered Nurse) on 08/19/23 at 1038  Med List Status: <None>   Medication Order Taking? Sig Documenting Provider Last Dose Status Informant  albuterol (VENTOLIN HFA) 108 (90 Base) MCG/ACT inhaler [id number] Yes INHALE 2 PUFFS INTO THE LUNGS EVERY 6 HOURS AS NEEDED FOR WHEEZING OR SHORTNESS OF Manus Gunning, MD Taking Active   aspirin EC 81 MG tablet [id number] Yes Take 1 tablet (81 mg total) by mouth daily. Abagail Kitchens, PA-C Taking Active Self  carvedilol (COREG) 25 MG tablet [id number] Yes TAKE 1 TABLET BY MOUTH TWICE DAILY WITH MEALS Skains, Veverly Fells, MD Taking Active   Cholecalciferol (VITAMIN D3) 50 MCG (2000 UT) TABS [id number] Yes Take 2,000 Units by mouth in the morning. [provider] Taking Active Self  Cyanocobalamin (VITAMIN B-12 PO) [id number] Yes Take 2,000 mcg by mouth in the morning. [provider] Taking Active Self           Med Note Daphine Deutscher, LINDA   Tue Sep 11, 2021 10:57 AM)    Fluticasone-Umeclidin-Vilant (TRELEGY ELLIPTA) 100-62.5-25 MCG/ACT AEPB [id number]  Inhale 1 puff into the lungs daily. Alfonse Spruce, MD  Active   glipiZIDE (GLUCOTROL) 5 MG tablet  [id number] Yes TAKE 1 TABLET(5 MG) BY MOUTH TWICE DAILY BEFORE A MEAL [name], MD Taking Active   glucose blood (ACCU-CHEK AVIVA PLUS) test strip [id number]  USE AS DIRECTED TO TEST BLOOD SUGAR TWICE DAILY [name], MD  Active   insulin glargine (LANTUS SOLOSTAR) 100 UNIT/ML Solostar Pen [id number] Yes INJECT 0.3 TO 0.35 MLS(30 TO 35 UNITS) INTO THE SKIN EVERY DAY [name], MD Taking Active Self           Med Note Nanetta Batty Oct 17, 2022 10:33 AM)    metFORMIN (GLUCOPHAGE) 500 MG tablet [id number] Yes TAKE 2 TABLETS BY MOUTH TWICE DAILY WITH FOOD [name], MD Taking Active   Multiple Vitamin (MULTIVITAMIN WITH MINERALS) TABS tablet [id number] Yes Take 1 tablet by mouth in the morning. [provider] Taking Active Self  omeprazole (PRILOSEC) 40 MG capsule [id number] Yes TAKE 1 CAPSULE(40 MG) BY MOUTH DAILY Nandigam, Eleonore Chiquito, MD Taking Active   prasugrel (EFFIENT) 10 MG TABS tablet [id number]  Yes TAKE 1 TABLET BY MOUTH EVERY DAY Walker, Caitlin S, NP Taking Active            Med Note Marrie Sizer, Darrall Strey E   Tue Aug 19, 2023 10:36 AM) Patient states he is taking 1/2 tablet per day.   silver sulfADIAZINE (SILVADENE) 1 % cream [id number] Yes Apply 1 Application topically daily. PCP to refill Butch Cashing, PA-C Taking Active   simvastatin (ZOCOR) 20 MG tablet [id number] Yes Take 1 tablet (20 mg total) by mouth at bedtime. Clearnce Curia, NP Taking Active Self  SSD 1 % cream [id number] Yes APPLY TOPICALLY TO THE AFFECTED AREA DAILY Adine Hoof, MD Taking Active   torsemide (DEMADEX) 20 MG tablet [id number]  Take 1 tablet (20 mg total) by mouth daily. Hugh Madura, MD  Expired 04/28/23 2359            Med Note (Tramane Gorum E   Tue Aug 19, 2023 10:36 AM) Patient states he is taking.   triamcinolone ointment (KENALOG) 0.1 % [id number] Yes APPLY TOPICALLY TO THE AFFECTED AREA TWICE DAILY Rochester Chuck, MD Taking Active   umeclidinium  bromide (INCRUSE ELLIPTA) 62.5 MCG/ACT AEPB [id number] No Inhale 1 puff into the lungs daily.  Patient not taking: Reported on 08/19/2023   Maire Scot, MD Not Taking Active Self  valsartan (DIOVAN) 40 MG tablet [id number] Yes Take 1 tablet (40 mg total) by mouth daily with supper. Hugh Madura, MD Taking Active             Recommendation:   PCP Follow-up- no further care management recommendations.   Follow Up Plan:   Patient has met all care management goals. Care Management case will be closed. Patient has been provided contact information should new needs arise.   Verba Girt RN, BSN, CCM [employer], Population Health Case Manager Phone: [phone]

## 2023-08-19 NOTE — Patient Instructions (Signed)
 Visit Information  Thank you for taking time to visit with me today. Please contact your primary care provider office if case management services are needed in the future.  Your Care management goals have been met.   Please call the care guide team at [phone] if you need to cancel, schedule, or reschedule an appointment.   Please call the Suicide and Crisis Lifeline: 988 call 1-800-273-TALK (toll free, 24 hour hotline) if you are experiencing a Mental Health or Behavioral Health Crisis or need someone to talk to.  Verba Girt RN, BSN, CCM [employer], Population Health Case Manager Phone: [phone]

## 2023-08-21 NOTE — Telephone Encounter (Signed)
 Pt is scheduled for follow up 4/18 with Neomi Banks, NP.

## 2023-08-22 ENCOUNTER — Other Ambulatory Visit: Payer: Self-pay | Admitting: Family Medicine

## 2023-08-22 ENCOUNTER — Encounter (HOSPITAL_BASED_OUTPATIENT_CLINIC_OR_DEPARTMENT_OTHER): Payer: Self-pay | Admitting: Family

## 2023-08-22 ENCOUNTER — Ambulatory Visit (HOSPITAL_BASED_OUTPATIENT_CLINIC_OR_DEPARTMENT_OTHER): Admitting: Family

## 2023-08-22 VITALS — BP 134/78 | HR 102 | Ht 72.0 in | Wt 182.9 lb

## 2023-08-22 DIAGNOSIS — I25118 Atherosclerotic heart disease of native coronary artery with other forms of angina pectoris: Secondary | ICD-10-CM

## 2023-08-22 DIAGNOSIS — I447 Left bundle-branch block, unspecified: Secondary | ICD-10-CM | POA: Diagnosis not present

## 2023-08-22 DIAGNOSIS — I10 Essential (primary) hypertension: Secondary | ICD-10-CM | POA: Diagnosis not present

## 2023-08-22 DIAGNOSIS — I255 Ischemic cardiomyopathy: Secondary | ICD-10-CM | POA: Diagnosis not present

## 2023-08-22 DIAGNOSIS — I739 Peripheral vascular disease, unspecified: Secondary | ICD-10-CM

## 2023-08-22 DIAGNOSIS — E785 Hyperlipidemia, unspecified: Secondary | ICD-10-CM

## 2023-08-22 DIAGNOSIS — R0602 Shortness of breath: Secondary | ICD-10-CM

## 2023-08-22 NOTE — Progress Notes (Signed)
 Cardiology Office Note:  .   Date:  08/25/2023  ID:  Luke [name], DOB 14-Mar-1946, MRN [id number] PCP: Donnie Galea, MD  [facility] Providers Cardiologist:  Dorothye Gathers, MD Electrophysiologist:  Richardo Chandler, MD    History of Present Illness: .   Luke [name] is a 78 y.o. male  hx of CHB, CAD (09/18/22 DES-LAD, 10/2022 CTO PCI to RCAl), HLD, IPF, ICM, CRT-ICD, CKD3a, PAD, agent orange exposure   Prior LHC 2013 and 2023 resulted in medical mgmt of distal RCA CTO with collaterals. However, with new anginal symptoms on 10/2022 underwent orbital atherectomy of LAD. Echo 10/30/22 LVEF 65%. Developed rash on Plavix , did not tolerate Brilinta  due to shortness of breath, eventually placed on Effient .    He saw Dr. Bruce Caper, consideration was made for RHC for further evaluation of PH. Mr. Mahler wished to discuss with Dr. Bertrum Brodie prior to proceeding.   History of Present Illness Presents with breathing difficulties and chest pain. The patient reports that the breathing difficulties have been stable since November, but he has noticed a slight worsening. The patient also mentions experiencing chest pain, described as a burning sensation, particularly during physical activity such as riding a stationary bike or doing yard work. Understandably frustrated by dyspnea. The patient also reports a rash, which he believes is a side effect of albuterol . The patient has been off inhaler medication Trelegy since January due to cost and is currently trying to resolve this issue with his insurance company. The patient also discusses issues with his heart medication, Effient , which he has reduced to half a tablet due to side effects of feeling "loopy" and unsteady. The patient is also on a low-dose aspirin  regimen.   Previous intolerances Brilinta  - shortness of breath, worsened neuropathy (not improved with caffeine) Plavix  - rash Atorvastatin  - myalgias Rosuvastatin  - myalgias  ROS: Please see  the history of present illness.    All other systems reviewed and are negative.   Studies Reviewed: Luke [name]   EKG Interpretation Date/Time:  Friday August 22 2023 15:29:38 EDT Ventricular Rate:  105 PR Interval:  164 QRS Duration:  172 QT Interval:  404 QTC Calculation: 533 R Axis:   244  Text Interpretation: Atrial-sensed ventricular-paced rhythm  No acute ST/T wave changes Confirmed by Neomi Banks ([id number]) on 08/22/2023 3:42:08 PM       Risk Assessment/Calculations:            Physical Exam:   VS:  BP 134/78   Pulse (!) 102   Ht 6' (1.829 m)   Wt 182 lb 14.4 oz (83 kg)   SpO2 93%   BMI 24.81 kg/m    Wt Readings from Last 3 Encounters:  08/22/23 182 lb 14.4 oz (83 kg)  08/19/23 177 lb (80.3 kg)  05/13/23 175 lb 12.8 oz (79.7 kg)    GEN: Well nourished, well developed in no acute distress.  NECK: No JVD; No carotid bruits CARDIAC: RRR, no murmurs, rubs, gallops. No LE edema.  RESPIRATORY:  Clear to auscultation without rales, wheezing or rhonchi  ABDOMEN: Soft, non-tender, non-distended EXTREMITIES:  No edema; No deformity   ASSESSMENT AND PLAN: .    CAD / HLD, LDL goal <70 - 09/18/22 s/p DES-LAD and 10/10/22 CTO intervention with DESx3 to RCA. Persistent dyspnea (likely multifactorial CAD, IPF) and ches tburning with activity. Plan for ischemic eval with cardiac PET> Did not tolerate Brilinta  (shortness of breath) nor Plavix  (significant rash). Presently only taking half tablet of  Effient  as reports makes him feel "loopy" - will route to Dr. Renna Cary for recommendations on possible alternative.  Intolerance to Atorvastatin  ,Rosuvastatin  with myalgia. Continue Simvastatin  20mg  daily.  Continue aspirin  81mg  daily, coreg  25mg  BID.  Recommend aiming for 150 minutes of moderate intensity activity per week and following a heart healthy diet.    HTN -  BP reasonably controlled. Continue current antihypertensive regimen carvedilol  25 mg twice daily, Valsartan  40mg  daily.  Discussed to  monitor BP at home at least 2 hours after medications and sitting for 5-10 minutes.    ICM with recovered LVEF - Euvolemic and well compensated on exam. GDMT torsemide  20mg  daily, coreg  25mg  BID, valsartan  40mg  daily.    CHB s/p ICD/CRT/LBBB - Follows with Dr. Rodolfo Clan.  No ICD firings.    PAD - s/p R femoral endarterectomy with Dr. Vikki Graves 10/29/22. No claudication.    IPF - Follows with Dr. Bertrum Brodie. Likely etiology of his profound dyspnea.   Informed Consent   Shared Decision Making/Informed Consent The risks [chest pain, shortness of breath, cardiac arrhythmias, dizziness, blood pressure fluctuations, myocardial infarction, stroke/transient ischemic attack, nausea, vomiting, allergic reaction, radiation exposure, metallic taste sensation and life-threatening complications (estimated to be 1 in 10,000)], benefits (risk stratification, diagnosing coronary artery disease, treatment guidance) and alternatives of a cardiac PET stress test were discussed in detail with Mr. Priebe and he agrees to proceed.        Dispo: follow up in 3-4 mos  Signed, Clearnce Curia, NP

## 2023-08-22 NOTE — Patient Instructions (Signed)
 Medication Instructions:  Your physician recommends that you continue on your current medications as directed. Please refer to the Current Medication list given to you today.  Testing/Procedures:    Please report to Radiology at the [facility] Main Entrance 30 minutes early for your test.  [address] [place] at [place], [place] [address]                         OR   Please report to Radiology at [facility] Main Entrance, medical mall, 30 mins prior to your test.  [address]  [place], [place]  How to Prepare for Your Cardiac PET/CT Stress Test:  Nothing to eat or drink, except water , 3 hours prior to arrival time.  NO caffeine/decaffeinated products, or chocolate 12 hours prior to arrival. (Please note decaffeinated beverages (teas/coffees) still contain caffeine).  If you have caffeine within 12 hours prior, the test will need to be rescheduled.  Medication instructions: Do not take erectile dysfunction medications for 72 hours prior to test (sildenafil , tadalafil) Do not take nitrates (isosorbide  mononitrate, Ranexa) the day before or day of test Do not take tamsulosin the day before or morning of test Hold theophylline containing medications for 12 hours. Hold Dipyridamole 48 hours prior to the test.  Diabetic Preparation: If able to eat breakfast prior to 3 hour fasting, you may take all medications, including your insulin . Do not worry if you miss your breakfast dose of insulin  - start at your next meal. If you do not eat prior to 3 hour fast-Hold all diabetes (oral and insulin ) medications. Patients who wear a continuous glucose monitor MUST remove the device prior to scanning.  You may take your remaining medications with water .  NO perfume, cologne or lotion on chest or abdomen area  Total time is 1 to 2 hours; you may want to bring reading material for the waiting time.  In preparation for your appointment, medication and supplies  will be purchased.  Appointment availability is limited, so if you need to cancel or reschedule, please call the Radiology Department Scheduler at [phone] 24 hours in advance to avoid a cancellation fee of $100.00  What to Expect When you Arrive:  Once you arrive and check in for your appointment, you will be taken to a preparation room within the Radiology Department.  A technologist or Nurse will obtain your medical history, verify that you are correctly prepped for the exam, and explain the procedure.  Afterwards, an IV will be started in your arm and electrodes will be placed on your skin for EKG monitoring during the stress portion of the exam. Then you will be escorted to the PET/CT scanner.  There, staff will get you positioned on the scanner and obtain a blood pressure and EKG.  During the exam, you will continue to be connected to the EKG and blood pressure machines.  A small, safe amount of a radioactive tracer will be injected in your IV to obtain a series of pictures of your heart along with an injection of a stress agent.    After your Exam:  It is recommended that you eat a meal and drink a caffeinated beverage to counter act any effects of the stress agent.  Drink plenty of fluids for the remainder of the day and urinate frequently for the first couple of hours after the exam.  Your doctor will inform you of your test results within 7-10 business days.  For more information and frequently asked questions, please visit our website: [email or web address]  For questions about your test or how to prepare for your test, please call: Cardiac Imaging Nurse Navigators Office: [phone]   Follow-Up: At [facility], you and your health needs are our priority.  As part of our continuing mission to provide you with exceptional heart care, our providers are all part of one team.  This team includes your primary Cardiologist (physician) and Advanced Practice  Providers or APPs (Physician Assistants and Nurse Practitioners) who all work together to provide you with the care you need, when you need it.  Your next appointment:   3-4 months with Dr. Jeffrie

## 2023-08-25 ENCOUNTER — Encounter (HOSPITAL_BASED_OUTPATIENT_CLINIC_OR_DEPARTMENT_OTHER): Payer: Self-pay | Admitting: Family

## 2023-08-25 NOTE — Addendum Note (Signed)
 Addended by: Nashonda Limberg S on: 08/25/2023 07:25 PM   Modules accepted: Orders

## 2023-09-01 ENCOUNTER — Ambulatory Visit: Attending: Cardiology

## 2023-09-01 DIAGNOSIS — I5042 Chronic combined systolic (congestive) and diastolic (congestive) heart failure: Secondary | ICD-10-CM

## 2023-09-01 DIAGNOSIS — Z95810 Presence of automatic (implantable) cardiac defibrillator: Secondary | ICD-10-CM

## 2023-09-03 ENCOUNTER — Telehealth: Payer: Self-pay

## 2023-09-03 ENCOUNTER — Ambulatory Visit: Admitting: Physician Assistant

## 2023-09-03 NOTE — Telephone Encounter (Signed)
 Remote ICM transmission received.  Attempted call to patient regarding ICM remote transmission and left detailed message per DPR.  Left ICM phone number and advised to return call for any fluid symptoms or questions. Next ICM remote transmission scheduled 10/06/2023.

## 2023-09-03 NOTE — Progress Notes (Signed)
 EPIC Encounter for ICM Monitoring  Patient Name: Luke [name] is a 78 y.o. male Date: 09/03/2023 Primary Care Physican: Donnie Galea, MD Primary Cardiologist: Skains/Sabharwal (as needed) Electrophysiologist: Camnitz Bi-V Pacing: >99%          03/24/2023 Weight: 173.6 lbs 04/15/2023 Weight: 174.2 lbs 05/01/2023 Weight: 170 lbs      05/21/2023: Weight: 170 lbs    06/17/2023 Weight: 179 lbs         06/20/2023 Weight: 175.5 lbs      07/30/2023 Weight: 175-178 lbs                                    Attempted call to patient and unable to reach.  Left detailed message per DPR regarding transmission.  Transmission results reviewed.    CorVue thoracic impedance suggesting possible dryness starting 4/24 but trending back toward baseline (possible response if patient took extra Torsemide  for fluid 4/19-4/22). Possible fluid accumulation starting 4/19-4/22.   Prescribed:  Torsemide  20 mg take 1 tablet(s) (20 mg total) by mouth daily.  06/17/23 He reports Dr Bruce Caper discussed with him to take extra Torsemide  x 2 days only if needed for fluid symptoms.   Labs: 04/24/2023 Creatinine 1.33, BUN 37, Potassium 5.5, Sodium 139, GFR 55 01/09/2023 Creatinine 1.24, BUN 28, Potassium 4.6, Sodium 135, GFR 56.58 A complete set of results can be found in Results Review.   Recommendations: Left voice mail with ICM number and encouraged to call if experiencing any fluid symptoms.   Follow-up plan: ICM clinic phone appointment on 10/06/2023.   91 day device clinic remote transmission 10/22/2023.     EP/Cardiology Office Visits:  Recall 12/12/2023 with Dr. Rodolfo Clan  12/08/2023 with Dr Renna Cary.     Copy of ICM check sent to Dr. Lawana Pray.   3 month ICM trend: 09/01/2023.    12-14 Month ICM trend:     Almyra Jain, RN 09/03/2023 4:02 PM

## 2023-09-05 NOTE — Addendum Note (Signed)
 Addended by: Lott Rouleau A on: 09/05/2023 11:40 AM   Modules accepted: Orders

## 2023-09-05 NOTE — Progress Notes (Signed)
 Remote ICD transmission.

## 2023-09-20 ENCOUNTER — Other Ambulatory Visit: Payer: Self-pay | Admitting: Physician Assistant

## 2023-09-22 DIAGNOSIS — E113293 Type 2 diabetes mellitus with mild nonproliferative diabetic retinopathy without macular edema, bilateral: Secondary | ICD-10-CM | POA: Diagnosis not present

## 2023-09-24 ENCOUNTER — Other Ambulatory Visit: Payer: Self-pay | Admitting: Physician Assistant

## 2023-09-24 ENCOUNTER — Telehealth: Payer: Self-pay

## 2023-09-24 MED ORDER — SILVER SULFADIAZINE 1 % EX CREA: TOPICAL_CREAM | Freq: Every day | CUTANEOUS | 1 refills | Status: DC

## 2023-09-24 MED ORDER — SILVER SULFADIAZINE 1 % EX CREA: 1.0000 | TOPICAL_CREAM | Freq: Every day | CUTANEOUS | 0 refills | Status: DC

## 2023-09-24 NOTE — Telephone Encounter (Signed)
 Pt called requesting refill on silvadene  cream. He feels this helped when he was prescribed it last year and has a new area he would like to apply it to. He is scheduled to return to office next week. APP has agreed to refill this and pt is aware.

## 2023-09-26 ENCOUNTER — Telehealth (HOSPITAL_COMMUNITY): Payer: Self-pay | Admitting: Emergency Medicine

## 2023-09-26 NOTE — Telephone Encounter (Signed)
 Reaching out to patient to offer assistance regarding upcoming cardiac imaging study; pt verbalizes understanding of appt date/time, parking situation and where to check in, pre-test NPO status and medications ordered, and verified current allergies; name and call back number provided for further questions should they arise Rockwell Alexandria RN Navigator Cardiac Imaging [facility] [phone] office [phone] cell

## 2023-09-30 ENCOUNTER — Ambulatory Visit (HOSPITAL_COMMUNITY)
Admission: RE | Admit: 2023-09-30 | Discharge: 2023-09-30 | Disposition: A | Discharge status: Home / Self Care | Source: Ambulatory Visit | Attending: Family | Admitting: Family

## 2023-09-30 ENCOUNTER — Ambulatory Visit (HOSPITAL_BASED_OUTPATIENT_CLINIC_OR_DEPARTMENT_OTHER): Payer: Self-pay | Admitting: Family

## 2023-09-30 DIAGNOSIS — R0602 Shortness of breath: Secondary | ICD-10-CM | POA: Diagnosis not present

## 2023-09-30 DIAGNOSIS — I5042 Chronic combined systolic (congestive) and diastolic (congestive) heart failure: Secondary | ICD-10-CM

## 2023-09-30 DIAGNOSIS — I25118 Atherosclerotic heart disease of native coronary artery with other forms of angina pectoris: Secondary | ICD-10-CM | POA: Diagnosis not present

## 2023-09-30 DIAGNOSIS — Z95810 Presence of automatic (implantable) cardiac defibrillator: Secondary | ICD-10-CM

## 2023-09-30 LAB — NM PET CT CARDIAC PERFUSION MULTI W/ABSOLUTE BLOODFLOW
LV dias vol: 112 mL (ref 62–150)
LV sys vol: 67 mL
MBFR: 1.31
Nuc Rest EF: 40 %
Nuc Stress EF: 34 %
Rest MBF: 0.99 ml/g/min
Rest Nuclear Isotope Dose: 21.2 mCi
ST Depression (mm): 0 mm
Stress MBF: 1.3 ml/g/min
Stress Nuclear Isotope Dose: 21.6 mCi

## 2023-09-30 MED ORDER — RUBIDIUM RB82 GENERATOR (RUBYFILL)
21.2100 | PACK | Freq: Once | INTRAVENOUS | Status: AC
  Administered 2023-09-30: 21.21 via INTRAVENOUS

## 2023-09-30 MED ORDER — REGADENOSON 0.4 MG/5ML IV SOLN
0.4000 mg | Freq: Once | INTRAVENOUS | Status: AC
  Administered 2023-09-30: 0.4 mg via INTRAVENOUS

## 2023-09-30 MED ORDER — REGADENOSON 0.4 MG/5ML IV SOLN
INTRAVENOUS | Status: AC
  Filled 2023-09-30: qty 5

## 2023-09-30 MED ORDER — RUBIDIUM RB82 GENERATOR (RUBYFILL)
21.6200 | PACK | Freq: Once | INTRAVENOUS | Status: AC
  Administered 2023-09-30: 21.62 via INTRAVENOUS

## 2023-10-01 ENCOUNTER — Other Ambulatory Visit (HOSPITAL_COMMUNITY): Payer: Self-pay

## 2023-10-01 MED ORDER — ENTRESTO 49-51 MG PO TABS: 1.0000 | ORAL_TABLET | Freq: Two times a day (BID) | ORAL | 3 refills | Status: DC

## 2023-10-01 NOTE — Telephone Encounter (Signed)
 Patient has [employer] Entresto (sacubitril/valsartan ), a medication used to treat heart failure, is generally placed on Tier 3 of Blue Medicare formularies. Tier 3 typically represents preferred brand-name drugs, which means it will likely have a higher co-pay than generic drugs (Tier 1) but less than non-preferred brand-name drugs (higher tiers).

## 2023-10-06 ENCOUNTER — Ambulatory Visit: Attending: Cardiology

## 2023-10-06 ENCOUNTER — Encounter: Payer: Self-pay | Admitting: Family Medicine

## 2023-10-06 ENCOUNTER — Ambulatory Visit (INDEPENDENT_AMBULATORY_CARE_PROVIDER_SITE_OTHER): Admitting: Family Medicine

## 2023-10-06 VITALS — BP 128/62 | HR 88 | Temp 98.2°F | Ht 72.0 in | Wt 181.2 lb

## 2023-10-06 DIAGNOSIS — I5023 Acute on chronic systolic (congestive) heart failure: Secondary | ICD-10-CM | POA: Diagnosis not present

## 2023-10-06 DIAGNOSIS — I25118 Atherosclerotic heart disease of native coronary artery with other forms of angina pectoris: Secondary | ICD-10-CM | POA: Diagnosis not present

## 2023-10-06 DIAGNOSIS — Z95810 Presence of automatic (implantable) cardiac defibrillator: Secondary | ICD-10-CM

## 2023-10-06 DIAGNOSIS — I5042 Chronic combined systolic (congestive) and diastolic (congestive) heart failure: Secondary | ICD-10-CM | POA: Diagnosis not present

## 2023-10-06 DIAGNOSIS — E1159 Type 2 diabetes mellitus with other circulatory complications: Secondary | ICD-10-CM | POA: Diagnosis not present

## 2023-10-06 DIAGNOSIS — Z7984 Long term (current) use of oral hypoglycemic drugs: Secondary | ICD-10-CM | POA: Diagnosis not present

## 2023-10-06 DIAGNOSIS — E785 Hyperlipidemia, unspecified: Secondary | ICD-10-CM

## 2023-10-06 DIAGNOSIS — L03119 Cellulitis of unspecified part of limb: Secondary | ICD-10-CM

## 2023-10-06 DIAGNOSIS — E1169 Type 2 diabetes mellitus with other specified complication: Secondary | ICD-10-CM

## 2023-10-06 LAB — COMPREHENSIVE METABOLIC PANEL WITH GFR
ALT: 12 U/L (ref 0–53)
AST: 12 U/L (ref 0–37)
Albumin: 4.3 g/dL (ref 3.5–5.2)
Alkaline Phosphatase: 67 U/L (ref 39–117)
BUN: 35 mg/dL — ABNORMAL HIGH (ref 6–23)
CO2: 28 meq/L (ref 19–32)
Calcium: 10.2 mg/dL (ref 8.4–10.5)
Chloride: 95 meq/L — ABNORMAL LOW (ref 96–112)
Creatinine, Ser: 1.47 mg/dL (ref 0.40–1.50)
GFR: 45.89 mL/min — ABNORMAL LOW (ref >60.00)
Glucose, Bld: 259 mg/dL — ABNORMAL HIGH (ref 70–99)
Potassium: 4.7 meq/L (ref 3.5–5.1)
Sodium: 134 meq/L — ABNORMAL LOW (ref 135–145)
Total Bilirubin: 0.7 mg/dL (ref 0.2–1.2)
Total Protein: 8.1 g/dL (ref 6.0–8.3)

## 2023-10-06 LAB — CBC WITH DIFFERENTIAL/PLATELET
Basophils Absolute: 0.1 10*3/uL (ref 0.0–0.1)
Basophils Relative: 0.5 % (ref 0.0–3.0)
Eosinophils Absolute: 0.1 10*3/uL (ref 0.0–0.7)
Eosinophils Relative: 1 % (ref 0.0–5.0)
HCT: 39.3 % (ref 39.0–52.0)
Hemoglobin: 13.4 g/dL (ref 13.0–17.0)
Lymphocytes Relative: 5.6 % — ABNORMAL LOW (ref 12.0–46.0)
Lymphs Abs: 0.7 10*3/uL (ref 0.7–4.0)
MCHC: 34.1 g/dL (ref 30.0–36.0)
MCV: 98.2 fl (ref 78.0–100.0)
Monocytes Absolute: 0.9 10*3/uL (ref 0.1–1.0)
Monocytes Relative: 7.2 % (ref 3.0–12.0)
Neutro Abs: 11 10*3/uL — ABNORMAL HIGH (ref 1.4–7.7)
Neutrophils Relative %: 85.7 % — ABNORMAL HIGH (ref 43.0–77.0)
Platelets: 344 10*3/uL (ref 150.0–400.0)
RBC: 4 Mil/uL — ABNORMAL LOW (ref 4.22–5.81)
RDW: 12.8 % (ref 11.5–15.5)
WBC: 12.8 10*3/uL — ABNORMAL HIGH (ref 4.0–10.5)

## 2023-10-06 LAB — HEMOGLOBIN A1C: Hgb A1c MFr Bld: 8.6 % — ABNORMAL HIGH (ref 4.6–6.5)

## 2023-10-06 MED ORDER — CIPROFLOXACIN HCL 500 MG PO TABS: 500.0000 mg | ORAL_TABLET | Freq: Two times a day (BID) | ORAL | 0 refills | Status: AC

## 2023-10-06 MED ORDER — TRIAMCINOLONE ACETONIDE 0.1 % EX OINT: TOPICAL_OINTMENT | Freq: Two times a day (BID) | CUTANEOUS | Status: DC | PRN

## 2023-10-06 MED ORDER — GLIPIZIDE 5 MG PO TABS: 10.0000 mg | ORAL_TABLET | Freq: Every day | ORAL | Status: DC

## 2023-10-06 MED ORDER — PRASUGREL HCL 10 MG PO TABS: 5.0000 mg | ORAL_TABLET | Freq: Two times a day (BID) | ORAL | Status: DC

## 2023-10-06 MED ORDER — VALSARTAN 40 MG PO TABS: 40.0000 mg | ORAL_TABLET | Freq: Every day | ORAL | Status: DC

## 2023-10-06 NOTE — Patient Instructions (Addendum)
 Restart cipro .  Go to the lab on the way out.   If you have mychart we'll likely use that to update you.    I would hold glipizide  while taking cipro .    I'll update cardiology.   Take care.  Glad to see you.

## 2023-10-06 NOTE — Progress Notes (Unsigned)
 He has been using portable O2 when needed, ie when working outside.  He has noted progressive shortness of breath with exertion but this is not an acute change.  He has cardiology follow-up pending.  Diabetes.  Taking 32 units insulin .  Done with previous prednisone  rx, earlier in 2025.  Med list updated.  Sugar 94 this AM.  He is still careful about diet.  No lows usually- had 1 episode of 64 and corrected with snack.    He is taking valsartan .  Entresto  was cost prohibitive.  Not yet on entresto .    He is off incruse in the meantime but he is going to try to restart soon.    Prev leg redness improved on cipro .  He has had return of right leg redness in the meantime and he has changes on the distal right third toe.  No fevers.  Meds, vitals, and allergies reviewed.   ROS: Per HPI unless specifically indicated in ROS section   Nad Nat Neck w/o LA Rrr Ctab, no focal decrease in breath sounds. Abdomen soft.  Nontender. Trace BLE edema. R shin and foot red but blanching. Small distal ulceration R 3rd toe with local slough.  No purulent discharge.

## 2023-10-07 ENCOUNTER — Telehealth: Payer: Self-pay

## 2023-10-07 ENCOUNTER — Other Ambulatory Visit: Payer: Self-pay | Admitting: Allergy & Immunology

## 2023-10-07 NOTE — Telephone Encounter (Signed)
Remote ICM transmission received.  Attempted call to patient regarding ICM remote transmission and left detailed message per DPR.  Left ICM phone number and advised to return call for any fluid symptoms or questions.

## 2023-10-07 NOTE — Progress Notes (Signed)
 EPIC Encounter for ICM Monitoring  Patient Name: Luke [name] is a 78 y.o. male Date: 10/07/2023 Primary Care Physican: Donnie Galea, MD Primary Cardiologist: Skains/Sabharwal (as needed) Electrophysiologist: Camnitz Bi-V Pacing: >99%          03/24/2023 Weight: 173.6 lbs 04/15/2023 Weight: 174.2 lbs 05/01/2023 Weight: 170 lbs      05/21/2023: Weight: 170 lbs    06/17/2023 Weight: 179 lbs         06/20/2023 Weight: 175.5 lbs      07/30/2023 Weight: 175-178 lbs 10/06/2023 Office Weight: 181 lbs                                    Attempted call to patient and unable to reach.  Left detailed message per DPR regarding transmission.  Transmission results reviewed.    CorVue thoracic impedance suggesting possible fluid accumulation from 5/8-5/15 & 5/23-5/30.   Prescribed:  Torsemide  20 mg take 1 tablet(s) (20 mg total) by mouth daily.  06/17/23 He reports Dr Bruce Caper discussed with him to take extra Torsemide  x 2 days only if needed for fluid symptoms.   Labs: 10/06/2023 Creatinine 1.47, BUN 35, Potassium 4.7, Sodium 134, GFR 45.89 04/24/2023 Creatinine 1.33, BUN 37, Potassium 5.5, Sodium 139, GFR 55 01/09/2023 Creatinine 1.24, BUN 28, Potassium 4.6, Sodium 135, GFR 56.58 A complete set of results can be found in Results Review.   Recommendations: Left voice mail with ICM number and encouraged to call if experiencing any fluid symptoms.   Follow-up plan: ICM clinic phone appointment on 11/24/2023.   91 day device clinic remote transmission 10/22/2023.     EP/Cardiology Office Visits:  Recall 12/12/2023 with Dr. Rodolfo Clan  10/08/2023 with Dr Renna Cary.     Copy of ICM check sent to Dr. Lawana Pray.   3 month ICM trend: 10/06/2023.    12-14 Month ICM trend:     Almyra Jain, RN 10/07/2023 8:26 AM

## 2023-10-08 ENCOUNTER — Ambulatory Visit: Payer: Self-pay | Admitting: Family Medicine

## 2023-10-08 ENCOUNTER — Ambulatory Visit: Attending: Cardiology | Admitting: Cardiology

## 2023-10-08 ENCOUNTER — Encounter: Payer: Self-pay | Admitting: Cardiology

## 2023-10-08 ENCOUNTER — Telehealth: Payer: Self-pay | Admitting: *Deleted

## 2023-10-08 VITALS — BP 102/60 | HR 77 | Ht 72.0 in | Wt 182.6 lb

## 2023-10-08 DIAGNOSIS — I2510 Atherosclerotic heart disease of native coronary artery without angina pectoris: Secondary | ICD-10-CM | POA: Diagnosis not present

## 2023-10-08 DIAGNOSIS — I25118 Atherosclerotic heart disease of native coronary artery with other forms of angina pectoris: Secondary | ICD-10-CM | POA: Diagnosis not present

## 2023-10-08 DIAGNOSIS — R0602 Shortness of breath: Secondary | ICD-10-CM | POA: Diagnosis not present

## 2023-10-08 MED ORDER — VALSARTAN 40 MG PO TABS: 40.0000 mg | ORAL_TABLET | Freq: Every day | ORAL | 3 refills | Status: DC

## 2023-10-08 NOTE — Patient Instructions (Addendum)
 Medication Instructions:  The current medical regimen is effective;  continue present plan and medications.  *If you need a refill on your cardiac medications before your next appointment, please call your pharmacy*  Lab Work:  If you have labs (blood work) drawn today and your tests are completely normal, you will receive your results only by: MyChart Message (if you have MyChart) OR A paper copy in the mail If you have any lab test that is abnormal or we need to change your treatment, we will call you to review the results.  Testing/Procedures: Your physician has requested that you have a cardiac catheterization. Cardiac catheterization is used to diagnose and/or treat various heart conditions. Doctors may recommend this procedure for a number of different reasons. The most common reason is to evaluate chest pain. Chest pain can be a symptom of coronary artery disease (CAD), and cardiac catheterization can show whether plaque is narrowing or blocking your heart's arteries. This procedure is also used to evaluate the valves, as well as measure the blood flow and oxygen  levels in different parts of your heart. For further information please visit [email or web address] Please follow instruction sheet, as given.    Follow-Up: At [facility], you and your health needs are our priority.  As part of our continuing mission to provide you with exceptional heart care, our providers are all part of one team.  This team includes your primary Cardiologist (physician) and Advanced Practice Providers or APPs (Physician Assistants and Nurse Practitioners) who all work together to provide you with the care you need, when you need it.  Your next appointment:   2 - 3 month(s)  Provider:   As scheduled at [facility] in August     OTHER INSTRUCTIONS You are scheduled for a Cardiac Catheterization on Friday, June 6 with Dr. Fransico Ivy.  1. Please arrive at the [facility] (Main Entrance A)  at [facility]: [address] [place], [place] [address] at 8:30 AM (This time is 2 hour(s) before your procedure to ensure your preparation).   Free valet parking service is available. You will check in at ADMITTING. The support person will be asked to wait in the waiting room.  It is OK to have someone drop you off and come back when you are ready to be discharged.    Special note: Every effort is made to have your procedure done on time. Please understand that emergencies sometimes delay scheduled procedures.  2. Diet: Do not eat solid foods after midnight.  The patient may have clear liquids until 5am upon the day of the procedure.  3. Labs: Completed on 10/06/23  4. Medication instructions in preparation for your procedure:   Contrast Allergy: No   Do Not take Torsemide  The morning of Heart Cath.   Take only 16 units of insulin  the night before your procedure. Do not take any insulin  on the day of the procedure.  Do not take Diabetes Med Glucophage  (Metformin ) on the day of the procedure and HOLD 48 HOURS AFTER THE PROCEDURE.  On the morning of your procedure, take your Effient /Prasugrel  and any morning medicines NOT listed above.  You may use sips of water .  5. Plan to go home the same day, you will only stay overnight if medically necessary. 6. Bring a current list of your medications and current insurance cards. 7. You MUST have a responsible person to drive you home. 8. Someone MUST be with you the first 24 hours after you arrive  home or your discharge will be delayed. 9. Please wear clothes that are easy to get on and off and wear slip-on shoes.  Thank you for allowing us  to care for you!   -- [facility]   If you have any questions or concerns, please don't hesitate to call.

## 2023-10-08 NOTE — Telephone Encounter (Signed)
 Cardiac Catheterization scheduled at [facility] for: Friday October 10, 2023 10:30 AM Arrival time [facility] Main Entrance A at: 5:30 AM-pre-procedure hydration  Nothing to eat after midnight prior to procedure, clear liquids until 5 AM day of procedure.  Medication instructions: -Hold:  Valsartan /Torsemide -day before and day of cath-per protocol eGFR < 60 (45.89)  Metformin -day of procedure and 48 hours post procedure Glipizide -AM of procedure  Insulin -AM of procedure/1/2 usual Insulin  dose HS prior to procedure -Other usual morning medications can be taken with sips of water  including aspirin  81 mg and Effient  10 mg  Plan to go home the same day, you will only stay overnight if medically necessary.  You must have responsible adult to drive you home.  Someone must be with you the first 24 hours after you arrive home.  Reviewed procedure instructions/pre-procedure hydration with patient.

## 2023-10-08 NOTE — Assessment & Plan Note (Signed)
 He has cardiology follow-up pending.  See notes on labs.  The question is how much of his symptoms are cardiac in nature versus pulmonary.  I think it makes sense to follow with cardiology to see what options are available.  I am asking for input about patient assistance with Entresto .

## 2023-10-08 NOTE — Assessment & Plan Note (Signed)
 He had previous response to Cipro .  Restart in the meantime with routine cautions.  I would hold glipizide  while taking Cipro , cautions discussed with patient.  He agrees with plan.

## 2023-10-08 NOTE — Assessment & Plan Note (Signed)
 The goal is to have reasonable control and limit risk of low sugars.  Routine cautions given to patient.  Would continue with insulin  as is for now.  He is going to hold glipizide  while he is taking Cipro .  Continue metformin  for now.  See notes on labs.  Okay for outpatient follow-up.

## 2023-10-08 NOTE — Progress Notes (Signed)
 Cardiology Office Note:  .   Date:  10/08/2023  ID:  Luke [name], DOB [birth year], MRN [id number] PCP: Donnie Galea, MD  [facility] Providers Cardiologist:  Dorothye Gathers, MD Electrophysiologist:  Richardo Chandler, MD     History of Present Illness: .   Luke [name] is a 78 y.o. male Discussed the use of AI scribe software for clinical note transcription with the patient, who gave verbal consent to proceed.  History of Present Illness Luke [name] "Raenette Bumps" is a 78 year old male with coronary artery disease and idiopathic pulmonary fibrosis who presents with persistent dyspnea and chest burning symptoms.  He continues to experience burning chest symptoms with activity. He underwent left heart catheterizations on Sep 18, 2022, with a drug-eluting stent to the LAD and on October 10, 2022, with a CTO intervention to the RCA with three drug-eluting stents. Despite these interventions, he has persistent dyspnea. A PET scan on Sep 30, 2022, showed evidence of ischemia and infarction with a medium defect and moderate reduction uptake in the mid to basal inferior location, partially reversible, consistent with abnormal perfusion in the RCA territory.  He is intolerant to Brilinta  due to shortness of breath and to Plavix  due to rash. He is currently taking half a tablet of Effient  as it makes him feel 'loopy'. He is intolerant to atorvastatin  and rosuvastatin  due to myalgia and is on simvastatin  20 mg daily. He takes aspirin  81 mg daily and carvedilol  25 mg twice daily. He has not been taking Entresto  due to cost and is on valsartan  40 mg daily.  He has a history of complete heart block with an ICD and CRT, and left bundle branch block. He also has peripheral arterial disease with a right femoral endarterectomy.  He has idiopathic pulmonary fibrosis and is followed by a specialist. He experiences dyspnea with minimal exertion, such as walking down steps to the garage, and is unable to ride his  stationary bike due to shortness of breath. He uses oxygen  during exercise, which helps maintain his oxygen  levels. He mentions that his lung disease is progressive but stable. He is unable to perform activities like raking leaves or pulling weeds due to his symptoms.     Studies Reviewed: Aaron Aas    EKG Interpretation Date/Time:  Wednesday October 08 2023 08:58:02 EDT Ventricular Rate:  77 PR Interval:  160 QRS Duration:  174 QT Interval:  434 QTC Calculation: 491 R Axis:   -86  Text Interpretation: Atrial-sensed ventricular-paced rhythm When compared with ECG of 22-Aug-2023 15:29, Vent. rate has decreased BY  28 BPM Confirmed by Dorothye Gathers ([id number]) on 10/08/2023 9:01:15 AM    Results RADIOLOGY Cardiac PET: Evidence of ischemia, evidence of infarction, medium defect, moderate reduction uptake in the mid to basal inferior location that is partially reversible. Abnormal wall motion in that defect area, consists of peri-infarct ischemia. Defect is consistent with abnormal perfusion in the RCA territory. (09/30/2022) CT scan: Lung emphysema and progressive lung disease.  DIAGNOSTIC Heart catheterization: Drug-eluting stent to the LAD. (09/18/2022) Heart catheterization: CTO intervention to the RCA with drug-eluting stent times three. (10/10/2022)  Risk Assessment/Calculations:            Physical Exam:   VS:  BP 102/60   Pulse 77   Ht 6' (1.829 m)   Wt 182 lb 9.6 oz (82.8 kg)   SpO2 94%   BMI 24.77 kg/m    Wt Readings from Last 3 Encounters:  10/08/23 182  lb 9.6 oz (82.8 kg)  10/06/23 181 lb 3.2 oz (82.2 kg)  08/22/23 182 lb 14.4 oz (83 kg)    GEN: Well nourished, well developed in no acute distress NECK: No JVD; No carotid bruits, nasal cannula oxygen  CARDIAC: RRR, no murmurs, no rubs, no gallops RESPIRATORY:  mild fibrotic crackles bilaterally ABDOMEN: Soft, non-tender, non-distended EXTREMITIES:  No edema; No deformity   ASSESSMENT AND PLAN: .    Assessment and  Plan Assessment & Plan Coronary Artery Disease Persistent dyspnea and chest burning with activity, likely multifactorial from CAD/interstitial pulmonary fibrosis. Previous interventions include left heart catheterizations with drug-eluting stent to the LAD and CTO intervention to the RCA with drug-eluting stents. Recent cardiac PET scan shows ischemia and infarction in the RCA territory, with peri-infarct ischemia and abnormal wall motion. Will repeat heart catheterization to assess the durability of previous interventions and potential need for further intervention.  Risks include myocardial infarction, death, and renal impairment, with a risk of less than 1 in 1000. - Perform right and left heart catheterization to assess coronary artery status and pulmonary pressures. - Continue Effient , aspirin , and carvedilol . - Prescribe valsartan  40 mg once daily as he is not on Entresto  due to cost. - Continue with stationary bike to the best of his ability/exercise efforts.  Idiopathic Pulmonary Fibrosis Progressive interstitial lung disease with associated dyspnea. Oxygen  saturation drops to 84% without supplemental oxygen . Disease contributes to overall respiratory symptoms. He is followed by pulmonologist Dr. Marcheta Seta. - Encourage use of supplemental oxygen  during activities to maintain oxygen  saturation. - Advise continuation of pulmonary rehabilitation exercises, including short bursts of activity on a stationary bike with oxygen  support.  Complete Heart Block with ICD Complete heart block managed with an ICD and CRT.  Peripheral Arterial Disease Right femoral endarterectomy performed previously.  Intolerance to Medications Intolerance to Brilinta  due to dyspnea, Plavix  due to rash, and atorvastatin  and rosuvastatin  due to myalgia. Currently on simvastatin  20 mg daily. Effient  causes dizziness, so he takes half a tablet twice daily. - Continue simvastatin  20 mg daily. - Continue Effient  at half  a tablet twice daily to manage side effects.          Signed, Dorothye Gathers, MD

## 2023-10-09 ENCOUNTER — Telehealth: Payer: Self-pay

## 2023-10-09 NOTE — Progress Notes (Signed)
 Complex Care Management Note  Care Guide Note 10/09/2023 Name: Luke [name] MRN: [id number] DOB: Dec 15, 1945  Luke [name] is a 78 y.o. year old male who sees Donnie Galea, MD for primary care. I reached out to Luke [name] by phone today to offer complex care management services.  Luke [name] was given information about Complex Care Management services today including:   The Complex Care Management services include support from the care team which includes your Nurse Care Manager, Clinical Social Worker, or Pharmacist.  The Complex Care Management team is here to help remove barriers to the health concerns and goals most important to you. Complex Care Management services are voluntary, and the patient may decline or stop services at any time by request to their care team member.   Complex Care Management Consent Status: Patient agreed to services and verbal consent obtained.   Follow up plan:  Telephone appointment with complex care management team member scheduled for:  10/15/23 at 1:00 p.m.   Encounter Outcome:  Patient Scheduled  [name]  [facility] Health Care Management Assistant Direct Dial: [phone]  Fax: [phone]

## 2023-10-10 ENCOUNTER — Ambulatory Visit (HOSPITAL_COMMUNITY)
Admission: RE | Admit: 2023-10-10 | Discharge: 2023-10-10 | Disposition: A | Discharge status: Home / Self Care | Attending: Cardiology | Admitting: Cardiology

## 2023-10-10 ENCOUNTER — Other Ambulatory Visit: Payer: Self-pay

## 2023-10-10 ENCOUNTER — Encounter (HOSPITAL_COMMUNITY)
Admission: RE | Disposition: A | Discharge status: Home / Self Care | Payer: Self-pay | Source: Home / Self Care | Attending: Cardiology

## 2023-10-10 DIAGNOSIS — I272 Pulmonary hypertension, unspecified: Secondary | ICD-10-CM | POA: Insufficient documentation

## 2023-10-10 DIAGNOSIS — R0609 Other forms of dyspnea: Secondary | ICD-10-CM

## 2023-10-10 DIAGNOSIS — Z7982 Long term (current) use of aspirin: Secondary | ICD-10-CM | POA: Diagnosis not present

## 2023-10-10 DIAGNOSIS — I25118 Atherosclerotic heart disease of native coronary artery with other forms of angina pectoris: Secondary | ICD-10-CM | POA: Insufficient documentation

## 2023-10-10 DIAGNOSIS — I442 Atrioventricular block, complete: Secondary | ICD-10-CM | POA: Insufficient documentation

## 2023-10-10 DIAGNOSIS — I2584 Coronary atherosclerosis due to calcified coronary lesion: Secondary | ICD-10-CM | POA: Insufficient documentation

## 2023-10-10 DIAGNOSIS — Z79899 Other long term (current) drug therapy: Secondary | ICD-10-CM | POA: Diagnosis not present

## 2023-10-10 DIAGNOSIS — J84112 Idiopathic pulmonary fibrosis: Secondary | ICD-10-CM | POA: Diagnosis not present

## 2023-10-10 DIAGNOSIS — Z95810 Presence of automatic (implantable) cardiac defibrillator: Secondary | ICD-10-CM | POA: Diagnosis not present

## 2023-10-10 DIAGNOSIS — Z955 Presence of coronary angioplasty implant and graft: Secondary | ICD-10-CM | POA: Diagnosis not present

## 2023-10-10 DIAGNOSIS — Z7902 Long term (current) use of antithrombotics/antiplatelets: Secondary | ICD-10-CM | POA: Insufficient documentation

## 2023-10-10 DIAGNOSIS — I447 Left bundle-branch block, unspecified: Secondary | ICD-10-CM | POA: Insufficient documentation

## 2023-10-10 HISTORY — PX: RIGHT/LEFT HEART CATH AND CORONARY ANGIOGRAPHY: CATH118266

## 2023-10-10 LAB — POCT I-STAT 7, (LYTES, BLD GAS, ICA,H+H)
Acid-base deficit: 1 mmol/L (ref 0.0–2.0)
Bicarbonate: 23.4 mmol/L (ref 20.0–28.0)
Calcium, Ion: 1.15 mmol/L (ref 1.15–1.40)
HCT: 34 % — ABNORMAL LOW (ref 39.0–52.0)
Hemoglobin: 11.6 g/dL — ABNORMAL LOW (ref 13.0–17.0)
O2 Saturation: 91 %
Potassium: 4.1 mmol/L (ref 3.5–5.1)
Sodium: 140 mmol/L (ref 135–145)
TCO2: 24 mmol/L (ref 22–32)
pCO2 arterial: 35.4 mmHg (ref 32–48)
pH, Arterial: 7.428 (ref 7.35–7.45)
pO2, Arterial: 60 mmHg — ABNORMAL LOW (ref 83–108)

## 2023-10-10 LAB — POCT I-STAT EG7
Acid-Base Excess: 1 mmol/L (ref 0.0–2.0)
Acid-Base Excess: 1 mmol/L (ref 0.0–2.0)
Acid-base deficit: 5 mmol/L — ABNORMAL HIGH (ref 0.0–2.0)
Bicarbonate: 21 mmol/L (ref 20.0–28.0)
Bicarbonate: 25.4 mmol/L (ref 20.0–28.0)
Bicarbonate: 26.3 mmol/L (ref 20.0–28.0)
Calcium, Ion: 1.08 mmol/L — ABNORMAL LOW (ref 1.15–1.40)
Calcium, Ion: 1.21 mmol/L (ref 1.15–1.40)
Calcium, Ion: 1.23 mmol/L (ref 1.15–1.40)
HCT: 35 % — ABNORMAL LOW (ref 39.0–52.0)
HCT: 35 % — ABNORMAL LOW (ref 39.0–52.0)
HCT: 35 % — ABNORMAL LOW (ref 39.0–52.0)
Hemoglobin: 11.9 g/dL — ABNORMAL LOW (ref 13.0–17.0)
Hemoglobin: 11.9 g/dL — ABNORMAL LOW (ref 13.0–17.0)
Hemoglobin: 11.9 g/dL — ABNORMAL LOW (ref 13.0–17.0)
O2 Saturation: 57 %
O2 Saturation: 64 %
O2 Saturation: 65 %
Potassium: 3.7 mmol/L (ref 3.5–5.1)
Potassium: 4.1 mmol/L (ref 3.5–5.1)
Potassium: 4.2 mmol/L (ref 3.5–5.1)
Sodium: 130 mmol/L — ABNORMAL LOW (ref 135–145)
Sodium: 139 mmol/L (ref 135–145)
Sodium: 139 mmol/L (ref 135–145)
TCO2: 22 mmol/L (ref 22–32)
TCO2: 27 mmol/L (ref 22–32)
TCO2: 28 mmol/L (ref 22–32)
pCO2, Ven: 40.8 mmHg — ABNORMAL LOW (ref 44–60)
pCO2, Ven: 41.1 mmHg — ABNORMAL LOW (ref 44–60)
pCO2, Ven: 41.7 mmHg — ABNORMAL LOW (ref 44–60)
pH, Ven: 7.318 (ref 7.25–7.43)
pH, Ven: 7.4 (ref 7.25–7.43)
pH, Ven: 7.408 (ref 7.25–7.43)
pO2, Ven: 32 mmHg (ref 32–45)
pO2, Ven: 33 mmHg (ref 32–45)
pO2, Ven: 33 mmHg (ref 32–45)

## 2023-10-10 LAB — GLUCOSE, CAPILLARY
Glucose-Capillary: 239 mg/dL — ABNORMAL HIGH (ref 70–99)
Glucose-Capillary: 258 mg/dL — ABNORMAL HIGH (ref 70–99)
Glucose-Capillary: 199 mg/dL — ABNORMAL HIGH (ref 70–99)

## 2023-10-10 SURGERY — RIGHT/LEFT HEART CATH AND CORONARY ANGIOGRAPHY
Anesthesia: LOCAL

## 2023-10-10 MED ORDER — HEPARIN (PORCINE) IN NACL 1000-0.9 UT/500ML-% IV SOLN
INTRAVENOUS | Status: DC | PRN
  Administered 2023-10-10 (×2): 500 mL

## 2023-10-10 MED ORDER — SODIUM CHLORIDE 0.9 % IV SOLN
250.0000 mL | INTRAVENOUS | Status: DC | PRN
Start: 2023-10-10 — End: 2023-10-10

## 2023-10-10 MED ORDER — MIDAZOLAM HCL 2 MG/2ML IJ SOLN
INTRAMUSCULAR | Status: DC | PRN
  Administered 2023-10-10: 1 mg via INTRAVENOUS

## 2023-10-10 MED ORDER — VERAPAMIL HCL 2.5 MG/ML IV SOLN
INTRAVENOUS | Status: DC | PRN
  Administered 2023-10-10: 10 mL via INTRA_ARTERIAL

## 2023-10-10 MED ORDER — SODIUM CHLORIDE 0.9% FLUSH: 3.0000 mL | INTRAVENOUS | Status: DC | PRN

## 2023-10-10 MED ORDER — PRASUGREL HCL 10 MG PO TABS: 10.0000 mg | ORAL_TABLET | ORAL | Status: AC

## 2023-10-10 MED ORDER — FENTANYL CITRATE (PF) 100 MCG/2ML IJ SOLN
INTRAMUSCULAR | Status: DC | PRN
  Administered 2023-10-10: 50 ug via INTRAVENOUS

## 2023-10-10 MED ORDER — FENTANYL CITRATE (PF) 100 MCG/2ML IJ SOLN
INTRAMUSCULAR | Status: AC
  Filled 2023-10-10: qty 2

## 2023-10-10 MED ORDER — VERAPAMIL HCL 2.5 MG/ML IV SOLN
INTRAVENOUS | Status: AC
  Filled 2023-10-10: qty 2

## 2023-10-10 MED ORDER — ACETAMINOPHEN 325 MG PO TABS: 650.0000 mg | ORAL_TABLET | ORAL | Status: DC | PRN

## 2023-10-10 MED ORDER — SODIUM CHLORIDE 0.9 % WEIGHT BASED INFUSION
1.0000 mL/kg/h | INTRAVENOUS | Status: DC
Start: 2023-10-10 — End: 2023-10-10

## 2023-10-10 MED ORDER — ASPIRIN 81 MG PO CHEW
81.0000 mg | CHEWABLE_TABLET | ORAL | Status: AC
Start: 2023-10-10 — End: 2023-10-10
  Administered 2023-10-10: 81 mg via ORAL
  Filled 2023-10-10: qty 1

## 2023-10-10 MED ORDER — SODIUM CHLORIDE 0.9 % IV SOLN: INTRAVENOUS | Status: AC

## 2023-10-10 MED ORDER — SODIUM CHLORIDE 0.9% FLUSH: 3.0000 mL | Freq: Two times a day (BID) | INTRAVENOUS | Status: DC

## 2023-10-10 MED ORDER — IOHEXOL 350 MG/ML SOLN
INTRAVENOUS | Status: DC | PRN
  Administered 2023-10-10: 30 mL

## 2023-10-10 MED ORDER — LIDOCAINE HCL (PF) 1 % IJ SOLN
INTRAMUSCULAR | Status: DC | PRN
  Administered 2023-10-10 (×2): 2 mL

## 2023-10-10 MED ORDER — LABETALOL HCL 5 MG/ML IV SOLN: 10.0000 mg | INTRAVENOUS | Status: DC | PRN

## 2023-10-10 MED ORDER — MIDAZOLAM HCL 2 MG/2ML IJ SOLN
INTRAMUSCULAR | Status: AC
  Filled 2023-10-10: qty 2

## 2023-10-10 MED ORDER — HEPARIN SODIUM (PORCINE) 1000 UNIT/ML IJ SOLN
INTRAMUSCULAR | Status: AC
  Filled 2023-10-10: qty 10

## 2023-10-10 MED ORDER — HEPARIN SODIUM (PORCINE) 1000 UNIT/ML IJ SOLN
INTRAMUSCULAR | Status: DC | PRN
  Administered 2023-10-10: 4500 [IU] via INTRAVENOUS

## 2023-10-10 MED ORDER — SODIUM CHLORIDE 0.9 % WEIGHT BASED INFUSION
3.0000 mL/kg/h | INTRAVENOUS | Status: AC
  Administered 2023-10-10: 3 mL/kg/h via INTRAVENOUS

## 2023-10-10 MED ORDER — ONDANSETRON HCL 4 MG/2ML IJ SOLN: 4.0000 mg | Freq: Four times a day (QID) | INTRAMUSCULAR | Status: DC | PRN

## 2023-10-10 SURGICAL SUPPLY — 11 items
CATH 5FR JL3.5 JR4 ANG PIG MP (CATHETERS) IMPLANT
CATH BALLN WEDGE 5F 110CM (CATHETERS) IMPLANT
CATH INFINITI 5 FR AR1 MOD (CATHETERS) IMPLANT
CATH INFINITI JR4 5F (CATHETERS) IMPLANT
COVER PRB 48X5XTLSCP FOLD TPE (BAG) IMPLANT
DEVICE RAD COMP TR BAND LRG (VASCULAR PRODUCTS) IMPLANT
GLIDESHEATH SLEND SS 6F .021 (SHEATH) IMPLANT
GUIDEWIRE INQWIRE 1.5J.035X260 (WIRE) IMPLANT
PACK CARDIAC CATHETERIZATION (CUSTOM PROCEDURE TRAY) ×1 IMPLANT
SET ATX-X65L (MISCELLANEOUS) IMPLANT
SHEATH GLIDE SLENDER 4/5FR (SHEATH) IMPLANT

## 2023-10-10 NOTE — Interval H&P Note (Signed)
 History and Physical Interval Note:  10/10/2023 10:09 AM  Luke [name]  has presented today for surgery, with the diagnosis of cad.  The various methods of treatment have been discussed with the patient and family. After consideration of risks, benefits and other options for treatment, the patient has consented to  Procedure(s): RIGHT/LEFT HEART CATH AND CORONARY ANGIOGRAPHY (N/A) as a surgical intervention.  The patient's history has been reviewed, patient examined, no change in status, stable for surgery.  I have reviewed the patient's chart and labs.  Questions were answered to the patient's satisfaction.     [name]

## 2023-10-10 NOTE — H&P (Signed)
 OV 10/08/2023 copied for documentation   Cardiology Office Note:  .   Date:  10/10/2023  ID:  Luke [name], DOB 11-26-1945, MRN [id number] PCP: Donnie Galea, MD  [facility] Providers Cardiologist:  Dorothye Gathers, MD Electrophysiologist:  Richardo Chandler, MD     History of Present Illness: .   Luke [name] is a 78 y.o. male Discussed the use of AI scribe software for clinical note transcription with the patient, who gave verbal consent to proceed.  History of Present Illness Luke [name] "Raenette Bumps" is a 78 year old male with coronary artery disease and idiopathic pulmonary fibrosis who presents with persistent dyspnea and chest burning symptoms.  He continues to experience burning chest symptoms with activity. He underwent left heart catheterizations on Sep 18, 2022, with a drug-eluting stent to the LAD and on October 10, 2022, with a CTO intervention to the RCA with three drug-eluting stents. Despite these interventions, he has persistent dyspnea. A PET scan on Sep 30, 2022, showed evidence of ischemia and infarction with a medium defect and moderate reduction uptake in the mid to basal inferior location, partially reversible, consistent with abnormal perfusion in the RCA territory.  He is intolerant to Brilinta  due to shortness of breath and to Plavix  due to rash. He is currently taking half a tablet of Effient  as it makes him feel 'loopy'. He is intolerant to atorvastatin  and rosuvastatin  due to myalgia and is on simvastatin  20 mg daily. He takes aspirin  81 mg daily and carvedilol  25 mg twice daily. He has not been taking Entresto  due to cost and is on valsartan  40 mg daily.  He has a history of complete heart block with an ICD and CRT, and left bundle branch block. He also has peripheral arterial disease with a right femoral endarterectomy.  He has idiopathic pulmonary fibrosis and is followed by a specialist. He experiences dyspnea with minimal exertion, such as walking down steps  to the garage, and is unable to ride his stationary bike due to shortness of breath. He uses oxygen  during exercise, which helps maintain his oxygen  levels. He mentions that his lung disease is progressive but stable. He is unable to perform activities like raking leaves or pulling weeds due to his symptoms.     Studies Reviewed: .         Results RADIOLOGY Cardiac PET: Evidence of ischemia, evidence of infarction, medium defect, moderate reduction uptake in the mid to basal inferior location that is partially reversible. Abnormal wall motion in that defect area, consists of peri-infarct ischemia. Defect is consistent with abnormal perfusion in the RCA territory. (09/30/2022) CT scan: Lung emphysema and progressive lung disease.  DIAGNOSTIC Heart catheterization: Drug-eluting stent to the LAD. (09/18/2022) Heart catheterization: CTO intervention to the RCA with drug-eluting stent times three. (10/10/2022)  Risk Assessment/Calculations:         Physical Exam:   VS:  BP (!) 149/75   Pulse 73   Temp 97.8 F (36.6 C)   Resp 17   Ht 6\' 1"  (1.854 m)   Wt 82.6 kg   SpO2 95%   BMI 24.01 kg/m    Wt Readings from Last 3 Encounters:  10/10/23 82.6 kg  10/08/23 82.8 kg  10/06/23 82.2 kg    GEN: Well nourished, well developed in no acute distress NECK: No JVD; No carotid bruits, nasal cannula oxygen  CARDIAC: RRR, no murmurs, no rubs, no gallops RESPIRATORY:  mild fibrotic crackles bilaterally ABDOMEN: Soft, non-tender, non-distended EXTREMITIES:  No edema; No deformity   ASSESSMENT AND PLAN: .    Assessment and Plan Assessment & Plan Coronary Artery Disease Persistent dyspnea and chest burning with activity, likely multifactorial from CAD/interstitial pulmonary fibrosis. Previous interventions include left heart catheterizations with drug-eluting stent to the LAD and CTO intervention to the RCA with drug-eluting stents. Recent cardiac PET scan shows ischemia and infarction in the  RCA territory, with peri-infarct ischemia and abnormal wall motion. Will repeat heart catheterization to assess the durability of previous interventions and potential need for further intervention.  Risks include myocardial infarction, death, and renal impairment, with a risk of less than 1 in 1000. - Perform right and left heart catheterization to assess coronary artery status and pulmonary pressures. - Continue Effient , aspirin , and carvedilol . - Prescribe valsartan  40 mg once daily as he is not on Entresto  due to cost. - Continue with stationary bike to the best of his ability/exercise efforts.  Idiopathic Pulmonary Fibrosis Progressive interstitial lung disease with associated dyspnea. Oxygen  saturation drops to 84% without supplemental oxygen . Disease contributes to overall respiratory symptoms. He is followed by pulmonologist Dr. Marcheta Seta. - Encourage use of supplemental oxygen  during activities to maintain oxygen  saturation. - Advise continuation of pulmonary rehabilitation exercises, including short bursts of activity on a stationary bike with oxygen  support.  Complete Heart Block with ICD Complete heart block managed with an ICD and CRT.  Peripheral Arterial Disease Right femoral endarterectomy performed previously.  Intolerance to Medications Intolerance to Brilinta  due to dyspnea, Plavix  due to rash, and atorvastatin  and rosuvastatin  due to myalgia. Currently on simvastatin  20 mg daily. Effient  causes dizziness, so he takes half a tablet twice daily. - Continue simvastatin  20 mg daily. - Continue Effient  at half a tablet twice daily to manage side effects.          Signed, Cody Das, MD

## 2023-10-10 NOTE — Discharge Instructions (Signed)
 Radial Site Care The following information offers guidance on how to care for yourself after your procedure. Your health care provider may also give you more specific instructions. If you have problems or questions, contact your health care provider. What can I expect after the procedure? After the procedure, it is common to have bruising and tenderness in the incision area. Follow these instructions at home: Incision site care  Follow instructions from your health care provider about how to take care of your incision site. Make sure you: Wash your hands with soap and water for at least 20 seconds before and after you change your bandage (dressing). If soap and water are not available, use hand sanitizer. Remove your dressing in 24 hours. Leave stitches (sutures), skin glue, or adhesive strips in place. These skin closures may need to stay in place for 2 weeks or longer. If adhesive strip edges start to loosen and curl up, you may trim the loose edges. Do not remove adhesive strips completely unless your health care provider tells you to do that. Do not take baths, swim, or use a hot tub for at least 1 week. You may shower 24 hours after the procedure or as told by your health care provider. Remove the dressing and gently wash the incision area with plain soap and water. Pat the area dry with a clean towel. Do not rub the site. That could cause bleeding. Do not apply powder or lotion to the site. Check your incision site every day for signs of infection. Check for: Redness, swelling, or pain. Fluid or blood. Warmth. Pus or a bad smell. Activity For 24 hours after the procedure, or as directed by your health care provider: Do not flex or bend the affected arm. Do not push or pull heavy objects with the affected arm. Do not operate machinery or power tools. Do not drive. You should not drive yourself home from the hospital or clinic if you go home during that time period. You may drive 24  hours after the procedure unless your health care provider tells you not to. Do not lift anything that is heavier than 10 lb (4.5 kg), or the limit that you are told, until your health care provider says that it is safe. Return to your normal activities as told by your health care provider. Ask your health care provider what activities are safe for you and when you can return to work. If you were given a sedative during the procedure, it can affect you for several hours. Do not drive or operate machinery until your health care provider says that it is safe. General instructions Take over-the-counter and prescription medicines only as told by your health care provider. If you will be going home right after the procedure, plan to have a responsible adult care for you for the time you are told. This is important. Keep all follow-up visits. This is important. Contact a health care provider if: You have a fever or chills. You have any of these signs of infection at your incision site: Redness, swelling, or pain. Fluid or blood. Warmth. Pus or a bad smell. Get help right away if: The incision area swells very fast. The incision area is bleeding, and the bleeding does not stop when you hold steady pressure on the area. Your arm or hand becomes pale, cool, tingly, or numb. These symptoms may represent a serious problem that is an emergency. Do not wait to see if the symptoms will go away. Get medical  help right away. Call your local emergency services (911 in the [place] Do not drive yourself to the hospital. Summary After the procedure, it is common to have bruising and tenderness at the incision site. Follow instructions from your health care provider about how to take care of your radial site incision. Check the incision every day for signs of infection. Do not lift anything that is heavier than 10 lb (4.5 kg), or the limit that you are told, until your health care provider says that it is  safe. Get help right away if the incision area swells very fast, you have bleeding at the incision site that will not stop, or your arm or hand becomes pale, cool, or numb. This information is not intended to replace advice given to you by your health care provider. Make sure you discuss any questions you have with your health care provider. Document Revised: 06/11/2020 Document Reviewed: 06/11/2020 Elsevier Patient Education  2024 [employer].

## 2023-10-11 ENCOUNTER — Other Ambulatory Visit (HOSPITAL_BASED_OUTPATIENT_CLINIC_OR_DEPARTMENT_OTHER): Payer: Self-pay | Admitting: Family

## 2023-10-11 DIAGNOSIS — I25118 Atherosclerotic heart disease of native coronary artery with other forms of angina pectoris: Secondary | ICD-10-CM

## 2023-10-11 DIAGNOSIS — E785 Hyperlipidemia, unspecified: Secondary | ICD-10-CM

## 2023-10-12 ENCOUNTER — Ambulatory Visit: Payer: Self-pay | Admitting: Family Medicine

## 2023-10-13 ENCOUNTER — Encounter (HOSPITAL_COMMUNITY): Payer: Self-pay | Admitting: Cardiology

## 2023-10-13 ENCOUNTER — Telehealth: Payer: Self-pay | Admitting: Allergy & Immunology

## 2023-10-13 ENCOUNTER — Other Ambulatory Visit: Payer: Self-pay | Admitting: Allergy & Immunology

## 2023-10-13 NOTE — Telephone Encounter (Signed)
 Bill called and stated that he is in need of a refill for Triamcinolone  ointment. He states Dr. Idolina Maker has prescribed it for him in the past. Looking at his chart it looks like it was prescribed by a different provider, but Raenette Bumps insists it was [facility]. He has a visit scheduled in July but says he needs this medication ASAP. Best contact [phone]

## 2023-10-14 MED ORDER — TRIAMCINOLONE ACETONIDE 0.1 % EX OINT: TOPICAL_OINTMENT | Freq: Two times a day (BID) | CUTANEOUS | 3 refills | Status: DC | PRN

## 2023-10-14 MED ORDER — SILVER SULFADIAZINE 1 % EX CREA: 1.0000 | TOPICAL_CREAM | Freq: Every day | CUTANEOUS | 3 refills | Status: DC

## 2023-10-14 NOTE — Telephone Encounter (Signed)
 Refilled triamcinolone  and silvadene  in case he needed.   Drexel Gentles, MD [facility] 

## 2023-10-15 ENCOUNTER — Encounter: Payer: Self-pay | Admitting: Pharmacist

## 2023-10-15 ENCOUNTER — Other Ambulatory Visit (INDEPENDENT_AMBULATORY_CARE_PROVIDER_SITE_OTHER): Admitting: Pharmacist

## 2023-10-15 DIAGNOSIS — I5022 Chronic systolic (congestive) heart failure: Secondary | ICD-10-CM

## 2023-10-15 NOTE — Progress Notes (Unsigned)
 Patient Assistance Program (PAP) Application   Manufacturer: Novartis    (New enrollment) Medication(s): Entresto   Patient Portion of Application:  6/11: Uploaded to font office eFax folder. Patient plans to sign in clinic this week.  Income Documentation:  Patient plans to provide copy of taxes this week via front office.   Next Steps: [x]    Application filled out and uploaded to [employer] for review/signature []    Once patient signs Novartis (Entresto ) application, application + tax documents to be placed in PCP folder for review/signature     Patient Assistance Program (PAP) Application   Manufacturer: [employer] (AZ&Me)    (New enrollment) Medication(s): To discuss with Allergist (Consider Breztri in place of Trelegy/Incruse due to cost)   Patient Portion of Application:  Application uploaded to front office eFax folder. Patient will pick up application and work with Allergist for completion.   Forwarded to [facility] CPhT Patient Advocate Team for future correspondences/re-enrollment.  Note routed to PCP Clinic Pool to ensure PCP signature is obtained and application is faxed.  *[facility] clinic team - Please Addend/update this note as the Next Steps are completed in office*

## 2023-10-15 NOTE — Progress Notes (Signed)
   10/15/2023 Name: Luke [name] MRN: [id number] DOB: 12-07-1945  Subjective  Chief Complaint  Patient presents with   Medication Access    Care Team: Primary Care Provider: Donnie Galea, MD Pulmonolisgist: Dr. Marcheta Seta Allergist: Dr. Idolina Maker  Reason for visit: ?  Luke [name] is a 78 y.o. male who presents today for a telephone visit with the pharmacist due to medication access concerns regarding their Entresto . ?  Medication Access: ?  Reports that his Entresto  copay is expensive due to his yearly deductible.   Upon review of current med list, he also states that his inhaler is quite expensive. He notes that initially, he was prescribed Trelegy, but insurance would not pay for it, so he had been on Incruse. More recently, Trelegy has been recommended by his allergist though due to his deductible he did not start it. States he has not been using any inhaler device as of late due to $33 OOP spend requirement for PAP eligibility to receive Ellipta inhalers for free as he did last year.   Prescription drug coverage: YES Medicare A/B + Medicare Supplement (w Rx benefit) Summary of Benefit: $123.64 remaining deductible  $103/month coinsurance on Entresto    Current Patient Assistance: Sanofi (Lantus ) - confirms he continues to receive shipments  Patient lives in a household of 2 with an estimated combined annual income of only SSI retirement.    Assessment and Plan:   1. Medication Access Patient should be eligible for Nov[employer]is PAP to receive Entresto  based on patient-reported estimated income <400% [employer].  Patient plans to bring in a copy of the first 2 pages of most recent tax return (eg, 1040) and will sign Novartis application in front office.  PAP application details documented in separate PAP encounter  Inhaler: Triple therapy recommended by allergist to replace Incruse. Cost has been a barrier to both. Not currently using any inhaler.  Would likely qualify for  Breztri through AZ&Me PAP. Recommended patient discuss with Dr. Idolina Maker. Application printed for patient, placed in front office. Patient plans to sign and bring the application to [facility] for completion.   Future Appointments  Date Time Provider Department Center  10/22/2023  7:00 AM CVD HVT DEVICE REMOTES [facility] H&V  10/22/2023  1:00 PM [facility]-VASC 2 [facility]-UL[facility] H&V  10/22/2023  1:45 PM [facility] PA-2 [facility] H&V  11/11/2023  3:45 PM Maire Scot, MD [facility] None  11/24/2023  7:05 AM CVD HVT DEVICE REMOTES [facility] H&V  12/04/2023  3:00 PM Rochester Chuck, MD [facility] None  12/08/2023  1:20 PM Hugh Madura, MD [facility]  01/21/2024  7:00 AM [facility] HVT DEVICE REMOTES [facility] H&V  04/21/2024  7:00 AM CVD HVT DEVICE REMOTES [facility] H&V  04/26/2024 10:50 AM [facility] ANNUAL WELLNESS VISIT 1 [facility] PEC  07/21/2024  7:00 AM CVD HVT DEVICE REMOTES [facility] H&V  10/20/2024  7:00 AM CVD HVT DEVICE REMOTES [facility] H&V  01/19/2025  7:00 AM CVD HVT DEVICE REMOTES [facility] H&V  04/20/2025  7:00 AM CVD HVT DEVICE REMOTES [facility] H&V  07/20/2025  7:00 AM CVD HVT DEVICE REMOTES [facility] H&V    Daron Ellen, PharmD Clinical Pharmacist [facility] [phone]

## 2023-10-17 ENCOUNTER — Telehealth: Payer: Self-pay | Admitting: Family Medicine

## 2023-10-17 NOTE — Telephone Encounter (Signed)
 Patient dropped off document FMLA, to be filled out by provider. Patient requested to send it back via Call Patient to pick up within 5-days. Document is located in providers tray at front office.Please advise at Mobile [phone] (mobile)     PT came in and sign paperwork Heidi Llamas the pharmacist left pt in the s-drive Pt also dropped off  a 1040-sr tax form for Heidi Llamas the Pharmacist

## 2023-10-22 ENCOUNTER — Ambulatory Visit (INDEPENDENT_AMBULATORY_CARE_PROVIDER_SITE_OTHER): Payer: Medicare Other | Admitting: Physician Assistant

## 2023-10-22 ENCOUNTER — Ambulatory Visit (HOSPITAL_COMMUNITY)
Admission: RE | Admit: 2023-10-22 | Discharge: 2023-10-22 | Disposition: A | Discharge status: Home / Self Care | Payer: Medicare Other | Source: Ambulatory Visit | Attending: Vascular Surgery | Admitting: Vascular Surgery

## 2023-10-22 ENCOUNTER — Ambulatory Visit (INDEPENDENT_AMBULATORY_CARE_PROVIDER_SITE_OTHER)

## 2023-10-22 VITALS — BP 147/84 | HR 75 | Temp 98.0°F | Ht 73.0 in | Wt 178.3 lb

## 2023-10-22 DIAGNOSIS — S90414A Abrasion, right lesser toe(s), initial encounter: Secondary | ICD-10-CM | POA: Diagnosis not present

## 2023-10-22 DIAGNOSIS — I739 Peripheral vascular disease, unspecified: Secondary | ICD-10-CM | POA: Diagnosis not present

## 2023-10-22 DIAGNOSIS — I447 Left bundle-branch block, unspecified: Secondary | ICD-10-CM | POA: Diagnosis not present

## 2023-10-22 LAB — CUP PACEART REMOTE DEVICE CHECK
Battery Remaining Longevity: 56 mo
Battery Remaining Percentage: 63 %
Battery Voltage: 2.95 V
Brady Statistic AP VP Percent: 15 %
Brady Statistic AP VS Percent: 1 %
Brady Statistic AS VP Percent: 84 %
Brady Statistic AS VS Percent: 1 %
Brady Statistic RA Percent Paced: 15 %
Date Time Interrogation Session: 20250618020235
HighPow Impedance: 80 Ohm
Implantable Lead Connection Status: 753985
Implantable Lead Connection Status: 753985
Implantable Lead Connection Status: 753985
Implantable Lead Implant Date: 20140313
Implantable Lead Implant Date: 20140313
Implantable Lead Implant Date: 20140313
Implantable Lead Location: 753858
Implantable Lead Location: 753859
Implantable Lead Location: 753860
Implantable Pulse Generator Implant Date: 20220622
Lead Channel Impedance Value: 380 Ohm
Lead Channel Impedance Value: 380 Ohm
Lead Channel Impedance Value: 800 Ohm
Lead Channel Pacing Threshold Amplitude: 0.75 V
Lead Channel Pacing Threshold Amplitude: 0.875 V
Lead Channel Pacing Threshold Amplitude: 3 V
Lead Channel Pacing Threshold Pulse Width: 0.5 ms
Lead Channel Pacing Threshold Pulse Width: 0.5 ms
Lead Channel Pacing Threshold Pulse Width: 0.6 ms
Lead Channel Sensing Intrinsic Amplitude: 12 mV
Lead Channel Sensing Intrinsic Amplitude: 2.3 mV
Lead Channel Setting Pacing Amplitude: 1.875
Lead Channel Setting Pacing Amplitude: 2 V
Lead Channel Setting Pacing Amplitude: 3.5 V
Lead Channel Setting Pacing Pulse Width: 0.5 ms
Lead Channel Setting Pacing Pulse Width: 0.6 ms
Lead Channel Setting Sensing Sensitivity: 0.5 mV
Pulse Gen Serial Number: 810030647

## 2023-10-22 LAB — VAS US ABI WITH/WO TBI
Left ABI: 0.87
Right ABI: 0.62

## 2023-10-22 NOTE — Progress Notes (Signed)
 HISTORY AND PHYSICAL     CC:  follow up. Requesting Provider:  Donnie Galea, MD  HPI: This is a 78 y.o. male who is here today for follow up for PAD.  Pt has hx of extensive right CFA endarterectomy including EIA, profunda and SFA endarterectomy with vein patch angioplasty with right GSV for CLI with tissue loss on 11/19/2022 by Dr. Vikki Graves. He has hx of EVAR 12/03/2011 by Dr. Shaunna Delaware.   He had undergone cardiac cath with stent placement and had a subsequent adverse reaction to either Plavix  or abx.  His rash mostly healed except for his RLE.  He was on Doxycycline  and it did help.    Pt was last seen 01/22/2023 and at that time, he was having on and off RLE skin peeling and weeping.  He was using silvadene  for this.   The pt returns today for follow up.  He states that he does not have any rest pain or claudication in his feet.  He states he did bump his 3rd toe this morning and is having some drainage from it.  He states that he has had some SOB and his pulmonologist does not feel it is from his lungs.  He did have another heart cath with blockages and have f/u appt with Kaitlyn.  He does have occasional angina.  He denies any abdominal pain.    The pt is on a statin for cholesterol management.    The pt is on an aspirin .    Other AC:  Effient  The pt is on BB, ARB, diuretic for hypertension.  The pt is  on diabetic medication. Tobacco hx:  former    Past Medical History:  Diagnosis Date   AAA (abdominal aortic aneurysm) (HCC) 2011   Per vascular surgery   Angio-edema    Atrial fibrillation (HCC)    CAD (coronary artery disease)    Presumed CAD with nuclear scan October 09, 2011,  large anteroseptal MI and inferior MI. Catheterization scheduled October 15, 2011   Cardiomyopathy [facility])    Nuclear, October 09, 2011, EF 30%, multiple focal wall motion abnormalities   Chronic kidney disease    CKD3   COVID-19    2021   Diabetes mellitus    type II   GERD (gastroesophageal reflux disease)     Silent   HLD (hyperlipidemia)    Hypertension    white coat HTN-- often elevated in office and controlled on outside checks.   ICD (implantable cardioverter-defibrillator) in place    CRT-D placed March, 2014 complete heart block and k dysfunction   IPF (idiopathic pulmonary fibrosis) (HCC)    LBBB (left bundle branch block)    LBBB on EKG October 11, 2011,  no prior EKG has been done   Low testosterone    Hx of   On home oxygen  therapy    2L as needed   Pacemaker    PAD (peripheral artery disease) (HCC)    Pancreatitis    Peripheral arterial disease (HCC)    Umbilical hernia     Past Surgical History:  Procedure Laterality Date   ABDOMINAL AORTAGRAM N/A 10/28/2011   Procedure: ABDOMINAL Tommi Fraise;  Surgeon: Dannis Dy, MD;  Location: [facility] CATH LAB;  Service: Cardiovascular;  Laterality: N/A;   ABDOMINAL AORTIC ANEURYSM REPAIR     EVAR    ABDOMINAL AORTOGRAM N/A 05/27/2022   Procedure: ABDOMINAL AORTOGRAM;  Surgeon: Adine Hoof, MD;  Location: [facility] INVASIVE CV LAB;  Service: Cardiovascular;  Laterality:  N/A;   BI-VENTRICULAR IMPLANTABLE CARDIOVERTER DEFIBRILLATOR N/A 07/16/2012   Procedure: BI-VENTRICULAR IMPLANTABLE CARDIOVERTER DEFIBRILLATOR  (CRT-D);  Surgeon: Verona Goodwill, MD;  Location: [facility] CATH LAB;  Service: Cardiovascular;  Laterality: N/A;   BIV ICD GENERATOR CHANGEOUT N/A 10/25/2020   Procedure: BIV ICD GENERATOR CHANGEOUT;  Surgeon: Verona Goodwill, MD;  Location: [facility] INVASIVE CV LAB;  Service: Cardiovascular;  Laterality: N/A;   CARDIAC CATHETERIZATION     CATARACT EXTRACTION Left 2023   COLONOSCOPY  03/23/2018; 2020   CORONARY CTO INTERVENTION N/A 10/10/2022   Procedure: CORONARY CTO INTERVENTION;  Surgeon: Lucendia Rusk, MD;  Location: [facility] INVASIVE CV LAB;  Service: Cardiovascular;  Laterality: N/A;   CORONARY LITHOTRIPSY N/A 09/18/2022   Procedure: CORONARY LITHOTRIPSY;  Surgeon: Lucendia Rusk, MD;  Location: [facility] INVASIVE CV LAB;   Service: Cardiovascular;  Laterality: N/A;   CORONARY STENT INTERVENTION N/A 09/18/2022   Procedure: CORONARY STENT INTERVENTION;  Surgeon: Lucendia Rusk, MD;  Location: [facility] INVASIVE CV LAB;  Service: Cardiovascular;  Laterality: N/A;   CORONARY ULTRASOUND/IVUS N/A 09/18/2022   Procedure: Coronary Ultrasound/IVUS;  Surgeon: Lucendia Rusk, MD;  Location: [facility] INVASIVE CV LAB;  Service: Cardiovascular;  Laterality: N/A;   ENDARTERECTOMY FEMORAL Right 11/19/2022   Procedure: RIGHT COMMON FEMORAL ENDARTERECTOMY WITH VEIN PATCH ANGIOPLASTY;  Surgeon: Adine Hoof, MD;  Location: [facility] OR;  Service: Vascular;  Laterality: Right;   ESOPHAGOGASTRODUODENOSCOPY N/A 02/12/2019   Procedure: ESOPHAGOGASTRODUODENOSCOPY (EGD);  Surgeon: Lajuan Pila, MD;  Location: [facility] ENDOSCOPY;  Service: Endoscopy;  Laterality: N/A;   ESOPHAGOGASTRODUODENOSCOPY (EGD) WITH PROPOFOL  N/A 02/05/2018   Procedure: ESOPHAGOGASTRODUODENOSCOPY (EGD) WITH PROPOFOL ;  Surgeon: Sergio Dandy, MD;  Location: [facility] ENDOSCOPY;  Service: Endoscopy;  Laterality: N/A;   ESOPHAGOGASTRODUODENOSCOPY (EGD) WITH PROPOFOL  N/A 09/27/2018   Procedure: ESOPHAGOGASTRODUODENOSCOPY (EGD) WITH PROPOFOL ;  Surgeon: Alvis Jourdain, MD;  Location: [facility] ENDOSCOPY;  Service: Endoscopy;  Laterality: N/A;   FOREIGN BODY REMOVAL  09/27/2018   Procedure: FOREIGN BODY REMOVAL;  Surgeon: Alvis Jourdain, MD;  Location: [facility] ENDOSCOPY;  Service: Endoscopy;;   FOREIGN BODY REMOVAL  02/12/2019   Procedure: FOREIGN BODY REMOVAL;  Surgeon: Lajuan Pila, MD;  Location: [facility] ENDOSCOPY;  Service: Endoscopy;;   HERNIA REPAIR  05/14/2013   INSERTION OF MESH N/A 05/14/2013   Procedure: INSERTION OF MESH;  Surgeon: Eddye Goodie, MD;  Location: [facility] OR;  Service: General;  Laterality: N/A;   LEFT HEART CATH N/A 10/10/2022   Procedure: Left Heart Cath;  Surgeon: Lucendia Rusk, MD;  Location: [facility] INVASIVE CV LAB;  Service: Cardiovascular;  Laterality: N/A;   LEFT  HEART CATH AND CORONARY ANGIOGRAPHY N/A 07/17/2021   Procedure: LEFT HEART CATH AND CORONARY ANGIOGRAPHY;  Surgeon: Millicent Ally, MD;  Location: [facility] INVASIVE CV LAB;  Service: Cardiovascular;  Laterality: N/A;   LEFT HEART CATH AND CORONARY ANGIOGRAPHY N/A 09/18/2022   Procedure: LEFT HEART CATH AND CORONARY ANGIOGRAPHY;  Surgeon: Lucendia Rusk, MD;  Location: [facility] INVASIVE CV LAB;  Service: Cardiovascular;  Laterality: N/A;   LOWER EXTREMITY ANGIOGRAM Bilateral 10/28/2011   Procedure: LOWER EXTREMITY ANGIOGRAM;  Surgeon: Dannis Dy, MD;  Location: [facility] CATH LAB;  Service: Cardiovascular;  Laterality: Bilateral;   LOWER EXTREMITY ANGIOGRAM Right 11/19/2022   Procedure: RIGHT LOWER EXTREMITY ANGIOGRAM;  Surgeon: Adine Hoof, MD;  Location: [facility] OR;  Service: Vascular;  Laterality: Right;   LOWER EXTREMITY ANGIOGRAPHY  05/27/2022   Procedure: Lower Extremity Angiography;  Surgeon: Adine Hoof, MD;  Location: [facility] INVASIVE  CV LAB;  Service: Cardiovascular;;   PACEMAKER INSERTION  07/16/2012   pacemaker/defibrilator   PATCH ANGIOPLASTY Right 11/19/2022   Procedure: VEIN PATCH ANGIOPLASTY;  Surgeon: Adine Hoof, MD;  Location: [facility] OR;  Service: Vascular;  Laterality: Right;   POLYPECTOMY     POSTERIOR CERVICAL FUSION/FORAMINOTOMY N/A 06/09/2014   Procedure: Laminectomy - Cervical two-Cervcial four posterior cervical instrumented fusion Cervical two-cervical four;  Surgeon: Isadora Mar, MD;  Location: [facility] NEURO ORS;  Service: Neurosurgery;  Laterality: N/A;  posterior    RIGHT/LEFT HEART CATH AND CORONARY ANGIOGRAPHY N/A 10/10/2023   Procedure: RIGHT/LEFT HEART CATH AND CORONARY ANGIOGRAPHY;  Surgeon: Cody Das, MD;  Location: [facility] INVASIVE CV LAB;  Service: Cardiovascular;  Laterality: N/A;   SAVORY DILATION N/A 02/05/2018   Procedure: SAVORY DILATION;  Surgeon: Sergio Dandy, MD;  Location: [facility] ENDOSCOPY;  Service: Endoscopy;   Laterality: N/A;   TONSILLECTOMY     as a child    UMBILICAL HERNIA REPAIR N/A 05/14/2013   Procedure: LAPAROSCOPIC exploration and repair of hernia in abdominal ;  Surgeon: Eddye Goodie, MD;  Location: [facility] OR;  Service: General;  Laterality: N/A;   UPPER GASTROINTESTINAL ENDOSCOPY  2020    Allergies  Allergen Reactions   Aldactone  [Spironolactone ] Other (See Comments)    Hyperkalemia   Brilinta  [Ticagrelor ] Shortness Of Breath    Numbness in hands and feet Blurry vision   Clopidogrel  Rash    Redness and Itchiness   Codeine Rash and Hives   Amoxicillin  Hives   Atorvastatin      Myalgias with lipitor.  Does tolerate simvastatin .     Benadryl  [Diphenhydramine ] Hives   Januvia  [Sitagliptin ] Other (See Comments)    Diarrhea and heart racing   Jardiance  [email or web address].[email or web address] ] Other (See Comments)    Polyuria; excessive weight loss   Lisinopril      Possible cause of pancreatitis   Rosuvastatin  Other (See Comments)    myalgia   Lasix  [Furosemide ] Rash    Current Outpatient Medications  Medication Sig Dispense Refill   aspirin  EC 81 MG tablet Take 1 tablet (81 mg total) by mouth daily. 120 tablet 2   carvedilol  (COREG ) 25 MG tablet TAKE 1 TABLET BY MOUTH TWICE DAILY WITH MEALS 180 tablet 3   Cholecalciferol  (VITAMIN D3) 50 MCG (2000 UT) TABS Take 2,000 Units by mouth in the morning.     Cyanocobalamin  (VITAMIN B-12 PO) Take 2,000 mcg by mouth in the morning.     glipiZIDE  (GLUCOTROL ) 5 MG tablet Take 2 tablets (10 mg total) by mouth daily. With supper.     glucose blood (ACCU-CHEK AVIVA PLUS) test strip USE AS DIRECTED TO TEST BLOOD SUGAR TWICE DAILY 200 strip 3   insulin  glargine (LANTUS  SOLOSTAR) 100 UNIT/ML Solostar Pen INJECT 0.3 TO 0.35 MLS(30 TO 35 UNITS) INTO THE SKIN EVERY DAY 15 mL 3   metFORMIN  (GLUCOPHAGE ) 500 MG tablet TAKE 2 TABLETS BY MOUTH TWICE DAILY WITH FOOD 360 tablet 3   Multiple Vitamin (MULTIVITAMIN WITH MINERALS) TABS tablet Take 1 tablet by mouth in the morning.      omeprazole  (PRILOSEC) 40 MG capsule TAKE 1 CAPSULE(40 MG) BY MOUTH DAILY 90 capsule 3   prasugrel  (EFFIENT ) 10 MG TABS tablet Take 0.5 tablets (5 mg total) by mouth in the morning and at bedtime.     silver  sulfADIAZINE  (SILVADENE ) 1 % cream Apply 1 Application topically daily. 50 g 3   simvastatin  (ZOCOR ) 20 MG tablet TAKE 1 TABLET(20 MG) BY MOUTH AT BEDTIME  90 tablet 3   torsemide  (DEMADEX ) 20 MG tablet Take 1 tablet (20 mg total) by mouth daily. 90 tablet 2   triamcinolone  ointment (KENALOG ) 0.1 % APPLY TOPICALLY TO THE AFFECTED AREA TWICE DAILY 454 g 1   triamcinolone  ointment (KENALOG ) 0.1 % Apply topically 2 (two) times daily as needed. 80 g 3   umeclidinium bromide  (INCRUSE ELLIPTA ) 62.5 MCG/ACT AEPB Inhale 1 puff into the lungs daily. 90 each 3   valsartan  (DIOVAN ) 40 MG tablet Take 1 tablet (40 mg total) by mouth daily with supper. 90 tablet 3   No current facility-administered medications for this visit.    Family History  Problem Relation Age of Onset   Hypertension Mother    Stroke Mother    Hyperlipidemia Mother    Lung cancer Father    Diabetes Sister    Heart disease Sister        Before age 36   Hypertension Sister    Hyperlipidemia Sister    Heart attack Sister    Hypertension Son    Colon cancer Neg Hx    Prostate cancer Neg Hx    Esophageal cancer Neg Hx    Stomach cancer Neg Hx    Rectal cancer Neg Hx    Colon polyps Neg Hx    Pancreatic cancer Neg Hx     Social History   Socioeconomic History   Marital status: Married    Spouse name: Elisabeth Guild   Number of children: 1   Years of education: Not on file   Highest education level: Bachelor's degree (e.g., BA, AB, BS)  Occupational History   Occupation: retired  Tobacco Use   Smoking status: Former    Current packs/day: 0.00    Average packs/day: 2.0 packs/day for 42.0 years (84.0 ttl pk-yrs)    Types: Cigarettes    Start date: 1964    Quit date: 05/06/2004    Years since quitting: 19.4    Passive  exposure: Never   Smokeless tobacco: Never  Vaping Use   Vaping status: Never Used  Substance and Sexual Activity   Alcohol use: Not Currently   Drug use: No   Sexual activity: Yes    Partners: Female  Other Topics Concern   Not on file  Social History Narrative   [place] vet, he has known service related agent orange exposure.    Retired   Community education officer daily.     Social Drivers of Corporate investment banker Strain: Low Risk  (10/02/2023)   Overall Financial Resource Strain (CARDIA)    Difficulty of Paying Living Expenses: Not hard at all  Food Insecurity: No Food Insecurity (10/02/2023)   Hunger Vital Sign    Worried About Running Out of Food in the Last Year: Never true    Ran Out of Food in the Last Year: Never true  Transportation Needs: No Transportation Needs (10/02/2023)   PRAPARE - Administrator, Civil Service (Medical): No    Lack of Transportation (Non-Medical): No  Physical Activity: Sufficiently Active (10/02/2023)   Exercise Vital Sign    Days of Exercise per Week: 5 days    Minutes of Exercise per Session: 30 min  Stress: No Stress Concern Present (10/02/2023)   [employer] of Occupational Health - Occupational Stress Questionnaire    Feeling of Stress : Not at all  Social Connections: Unknown (10/02/2023)   Social Connection and Isolation Panel    Frequency of Communication with Friends and Family: Once a week  Frequency of Social Gatherings with Friends and Family: Once a week    Attends Religious Services: Patient declined    Active Member of Clubs or Organizations: Yes    Attends Banker Meetings: 1 to 4 times per year    Marital Status: Married  Catering manager Violence: Not At Risk (04/24/2023)   Humiliation, Afraid, Rape, and Kick questionnaire    Fear of Current or Ex-Partner: No    Emotionally Abused: No    Physically Abused: No    Sexually Abused: No     REVIEW OF SYSTEMS:   [X]  denotes positive finding, [ ]   denotes negative finding Cardiac  Comments:  Chest pain or chest pressure: x angina  Shortness of breath upon exertion: x   Short of breath when lying flat:    Irregular heart rhythm:        Vascular    Pain in calf, thigh, or hip brought on by ambulation:    Pain in feet at night that wakes you up from your sleep:     Blood clot in your veins:    Leg swelling:         Pulmonary    Oxygen  at home:    Productive cough:     Wheezing:         Neurologic    Sudden weakness in arms or legs:     Sudden numbness in arms or legs:     Sudden onset of difficulty speaking or slurred speech:    Temporary loss of vision in one eye:     Problems with dizziness:         Gastrointestinal    Blood in stool:     Vomited blood:         Genitourinary    Burning when urinating:     Blood in urine:        Psychiatric    Major depression:         Hematologic    Bleeding problems:    Problems with blood clotting too easily:        Skin    Rashes or ulcers: x       Constitutional    Fever or chills:      PHYSICAL EXAMINATION:  Today's Vitals   10/22/23 1337  BP: (!) 147/84  Pulse: 75  Temp: 98 F (36.7 C)  TempSrc: Temporal  SpO2: 93%  Weight: 178 lb 4.8 oz (80.9 kg)  Height: 6' 1 (1.854 m)  PainSc: 0-No pain   Body mass index is 23.52 kg/m.   General:  WDWN in NAD; vital signs documented above Gait: Not observed HENT: WNL, normocephalic Pulmonary: normal non-labored breathing , without wheezing Cardiac: regular HR, without carotid bruits Abdomen: soft, NT; aortic pulse is not palpable Skin: without rashes Vascular Exam/Pulses:  Right Left  Radial 2+ (normal) 2+ (normal)  DP monophasic Brisk monophasic  PT absent Brisk biphasic  Peroneal monophasic Brisk biphasic   Extremities: skin abrasion right 3rd toe with some drainage.   Neurologic: A&O X 3 Psychiatric:  The pt has Normal affect.   Non-Invasive Vascular Imaging:   ABI's/TBI's on 10/22/2023: Right:   0.62/0.21 - Great toe pressure: 33 Left:  0.87/0.55 - Great toe pressure: 87   Previous ABI's/TBI's on 04/23/2023: Right:  0.53/0.20 - Great toe pressure: 25 Left:  0.88/0.45 - Great toe pressure:  57     ASSESSMENT/PLAN:: 78 y.o. male here for follow up for PAD with hx of extensive  right CFA endarterectomy including EIA, profunda and SFA endarterectomy with vein patch angioplasty with right GSV for CLI with tissue loss on 11/19/2022 by Dr. Vikki Graves. He has hx of EVAR 12/03/2011 by Dr. Shaunna Delaware.   He had undergone cardiac cath with stent placement and had a subsequent adverse reaction to either Plavix  or abx.  His rash mostly healed except for his RLE.  He was on Doxycycline  and it did help.    PAD -pt ABI and toe pressure on the right slightly improved from previous study.  He did stub his right 3rd toe this morning and now it has some drainage.  I discussed with pt that if this worsens, he would need angiogram to evaluate his blood flow and it could put him at risk for amputation of toe, foot, possibly more proximal amputation.  Discussed with him to keep this area clean and dry.   -continue asa/statin  EVAR -pt has not had duplex in 1.5 years.  Will have him return in a couple of weeks for duplex as well as toe check.  He knows that if this worsens before then, he should contact us  to be seen sooner.  He expressed good understanding.  He is on effient , statin and asa.   Maryanna Smart, [facility] [phone]  Clinic MD:   Vikki Graves

## 2023-10-23 ENCOUNTER — Ambulatory Visit: Payer: Self-pay | Admitting: Cardiology

## 2023-10-23 ENCOUNTER — Other Ambulatory Visit: Payer: Self-pay | Admitting: *Deleted

## 2023-10-23 DIAGNOSIS — Z9889 Other specified postprocedural states: Secondary | ICD-10-CM

## 2023-10-31 ENCOUNTER — Ambulatory Visit (HOSPITAL_COMMUNITY)
Admission: RE | Admit: 2023-10-31 | Discharge: 2023-10-31 | Disposition: A | Discharge status: Home / Self Care | Source: Ambulatory Visit | Attending: Vascular Surgery

## 2023-10-31 DIAGNOSIS — Z9889 Other specified postprocedural states: Secondary | ICD-10-CM | POA: Diagnosis not present

## 2023-11-05 ENCOUNTER — Other Ambulatory Visit: Payer: Self-pay | Admitting: Internal Medicine

## 2023-11-05 ENCOUNTER — Telehealth: Payer: Self-pay | Admitting: Internal Medicine

## 2023-11-05 ENCOUNTER — Ambulatory Visit: Attending: Vascular Surgery | Admitting: Physician Assistant

## 2023-11-05 ENCOUNTER — Encounter: Payer: Self-pay | Admitting: Physician Assistant

## 2023-11-05 VITALS — BP 115/72 | HR 84 | Temp 97.8°F | Ht 73.0 in | Wt 180.2 lb

## 2023-11-05 DIAGNOSIS — Z9889 Other specified postprocedural states: Secondary | ICD-10-CM | POA: Diagnosis not present

## 2023-11-05 DIAGNOSIS — I739 Peripheral vascular disease, unspecified: Secondary | ICD-10-CM

## 2023-11-05 DIAGNOSIS — J84112 Idiopathic pulmonary fibrosis: Secondary | ICD-10-CM

## 2023-11-05 DIAGNOSIS — Z8679 Personal history of other diseases of the circulatory system: Secondary | ICD-10-CM

## 2023-11-05 DIAGNOSIS — S91104A Unspecified open wound of right lesser toe(s) without damage to nail, initial encounter: Secondary | ICD-10-CM

## 2023-11-05 NOTE — Telephone Encounter (Signed)
 Patient is calling returning call he received from nurse shelby about a breathing test will get patient scheduled

## 2023-11-05 NOTE — Telephone Encounter (Signed)
 Tried to schedule patient for a pft but it was pulling up nothing but September called cal and they got him scheduled for 2:30 on July 8th

## 2023-11-05 NOTE — Progress Notes (Signed)
 HISTORY AND PHYSICAL     CC:  follow up. For EVAR Requesting Provider:  Cleatus Arlyss RAMAN, MD  HPI: This is a 78 y.o. male with hx of  extensive right CFA endarterectomy including EIA, profunda and SFA endarterectomy with vein patch angioplasty with right GSV for CLI with tissue loss on 11/19/2022 by Dr. Sheree. He has hx of EVAR 12/03/2011 by Dr. Eliza.    He had undergone cardiac cath with stent placement and had a subsequent adverse reaction to either Plavix  or abx.  His rash mostly healed except for his RLE.  He was on Doxycycline  and it did help.    Pt was last seen a couple of weeks ago and at that time, he was doing well but had bumped his toe and had some drainage from it.  He had not had an EVAR duplex in some time so I brought him back for this.    The pt returns today for follow up studies.  He states his toe is improving.  He has been using the silver  sulfadiazine  on his right leg about every other day and using the triamcinolone  cream as well.  He state these help his leg and foot.    The pt is on a statin for cholesterol management.    The pt is on an aspirin .    Other AC:  Effient  The pt is on BB, ARB, diuretic for hypertension.  The pt is  on diabetic medication. Tobacco hx:  former  Past Medical History:  Diagnosis Date   AAA (abdominal aortic aneurysm) (HCC) 2011   Per vascular surgery   Angio-edema    Atrial fibrillation (HCC)    CAD (coronary artery disease)    Presumed CAD with nuclear scan October 09, 2011,  large anteroseptal MI and inferior MI. Catheterization scheduled October 15, 2011   Cardiomyopathy [facility])    Nuclear, October 09, 2011, EF 30%, multiple focal wall motion abnormalities   Chronic kidney disease    CKD3   COVID-19    2021   Diabetes mellitus    type II   GERD (gastroesophageal reflux disease)    Silent   HLD (hyperlipidemia)    Hypertension    white coat HTN-- often elevated in office and controlled on outside checks.   ICD (implantable  cardioverter-defibrillator) in place    CRT-D placed March, 2014 complete heart block and k dysfunction   IPF (idiopathic pulmonary fibrosis) (HCC)    LBBB (left bundle branch block)    LBBB on EKG October 11, 2011,  no prior EKG has been done   Low testosterone    Hx of   On home oxygen  therapy    2L as needed   Pacemaker    PAD (peripheral artery disease) (HCC)    Pancreatitis    Peripheral arterial disease (HCC)    Umbilical hernia     Past Surgical History:  Procedure Laterality Date   ABDOMINAL AORTAGRAM N/A 10/28/2011   Procedure: ABDOMINAL EZELLA;  Surgeon: Lonni RAMAN Eliza, MD;  Location: [facility] CATH LAB;  Service: Cardiovascular;  Laterality: N/A;   ABDOMINAL AORTIC ANEURYSM REPAIR     EVAR    ABDOMINAL AORTOGRAM N/A 05/27/2022   Procedure: ABDOMINAL AORTOGRAM;  Surgeon: Sheree Penne Lonni, MD;  Location: [facility] INVASIVE CV LAB;  Service: Cardiovascular;  Laterality: N/A;   BI-VENTRICULAR IMPLANTABLE CARDIOVERTER DEFIBRILLATOR N/A 07/16/2012   Procedure: BI-VENTRICULAR IMPLANTABLE CARDIOVERTER DEFIBRILLATOR  (CRT-D);  Surgeon: Elspeth JAYSON Sage, MD;  Location: [facility] CATH LAB;  Service: Cardiovascular;  Laterality: N/A;   BIV ICD GENERATOR CHANGEOUT N/A 10/25/2020   Procedure: BIV ICD GENERATOR CHANGEOUT;  Surgeon: Fernande Elspeth BROCKS, MD;  Location: [facility] INVASIVE CV LAB;  Service: Cardiovascular;  Laterality: N/A;   CARDIAC CATHETERIZATION     CATARACT EXTRACTION Left 2023   COLONOSCOPY  03/23/2018; 2020   CORONARY CTO INTERVENTION N/A 10/10/2022   Procedure: CORONARY CTO INTERVENTION;  Surgeon: Dann Candyce RAMAN, MD;  Location: [facility] INVASIVE CV LAB;  Service: Cardiovascular;  Laterality: N/A;   CORONARY LITHOTRIPSY N/A 09/18/2022   Procedure: CORONARY LITHOTRIPSY;  Surgeon: Dann Candyce RAMAN, MD;  Location: [facility] INVASIVE CV LAB;  Service: Cardiovascular;  Laterality: N/A;   CORONARY STENT INTERVENTION N/A 09/18/2022   Procedure: CORONARY STENT INTERVENTION;  Surgeon: Dann Candyce RAMAN, MD;  Location: [facility] INVASIVE CV LAB;  Service: Cardiovascular;  Laterality: N/A;   CORONARY ULTRASOUND/IVUS N/A 09/18/2022   Procedure: Coronary Ultrasound/IVUS;  Surgeon: Dann Candyce RAMAN, MD;  Location: [facility] INVASIVE CV LAB;  Service: Cardiovascular;  Laterality: N/A;   ENDARTERECTOMY FEMORAL Right 11/19/2022   Procedure: RIGHT COMMON FEMORAL ENDARTERECTOMY WITH VEIN PATCH ANGIOPLASTY;  Surgeon: Sheree Penne Bruckner, MD;  Location: [facility] OR;  Service: Vascular;  Laterality: Right;   ESOPHAGOGASTRODUODENOSCOPY N/A 02/12/2019   Procedure: ESOPHAGOGASTRODUODENOSCOPY (EGD);  Surgeon: Charlanne Groom, MD;  Location: [facility] ENDOSCOPY;  Service: Endoscopy;  Laterality: N/A;   ESOPHAGOGASTRODUODENOSCOPY (EGD) WITH PROPOFOL  N/A 02/05/2018   Procedure: ESOPHAGOGASTRODUODENOSCOPY (EGD) WITH PROPOFOL ;  Surgeon: Shila Gustav GAILS, MD;  Location: [facility] ENDOSCOPY;  Service: Endoscopy;  Laterality: N/A;   ESOPHAGOGASTRODUODENOSCOPY (EGD) WITH PROPOFOL  N/A 09/27/2018   Procedure: ESOPHAGOGASTRODUODENOSCOPY (EGD) WITH PROPOFOL ;  Surgeon: Rollin Dover, MD;  Location: [facility] ENDOSCOPY;  Service: Endoscopy;  Laterality: N/A;   FOREIGN BODY REMOVAL  09/27/2018   Procedure: FOREIGN BODY REMOVAL;  Surgeon: Rollin Dover, MD;  Location: [facility] ENDOSCOPY;  Service: Endoscopy;;   FOREIGN BODY REMOVAL  02/12/2019   Procedure: FOREIGN BODY REMOVAL;  Surgeon: Charlanne Groom, MD;  Location: [facility] ENDOSCOPY;  Service: Endoscopy;;   HERNIA REPAIR  05/14/2013   INSERTION OF MESH N/A 05/14/2013   Procedure: INSERTION OF MESH;  Surgeon: Elspeth KYM Schultze, MD;  Location: [facility] OR;  Service: General;  Laterality: N/A;   LEFT HEART CATH N/A 10/10/2022   Procedure: Left Heart Cath;  Surgeon: Dann Candyce RAMAN, MD;  Location: [facility] INVASIVE CV LAB;  Service: Cardiovascular;  Laterality: N/A;   LEFT HEART CATH AND CORONARY ANGIOGRAPHY N/A 07/17/2021   Procedure: LEFT HEART CATH AND CORONARY ANGIOGRAPHY;  Surgeon: Burnard Debby LABOR, MD;  Location: [facility]  INVASIVE CV LAB;  Service: Cardiovascular;  Laterality: N/A;   LEFT HEART CATH AND CORONARY ANGIOGRAPHY N/A 09/18/2022   Procedure: LEFT HEART CATH AND CORONARY ANGIOGRAPHY;  Surgeon: Dann Candyce RAMAN, MD;  Location: [facility] INVASIVE CV LAB;  Service: Cardiovascular;  Laterality: N/A;   LOWER EXTREMITY ANGIOGRAM Bilateral 10/28/2011   Procedure: LOWER EXTREMITY ANGIOGRAM;  Surgeon: Bruckner RAMAN Blade, MD;  Location: [facility] CATH LAB;  Service: Cardiovascular;  Laterality: Bilateral;   LOWER EXTREMITY ANGIOGRAM Right 11/19/2022   Procedure: RIGHT LOWER EXTREMITY ANGIOGRAM;  Surgeon: Sheree Penne Bruckner, MD;  Location: [facility] OR;  Service: Vascular;  Laterality: Right;   LOWER EXTREMITY ANGIOGRAPHY  05/27/2022   Procedure: Lower Extremity Angiography;  Surgeon: Sheree Penne Bruckner, MD;  Location: [facility] INVASIVE CV LAB;  Service: Cardiovascular;;   PACEMAKER INSERTION  07/16/2012   pacemaker/defibrilator   PATCH ANGIOPLASTY Right 11/19/2022   Procedure: VEIN PATCH ANGIOPLASTY;  Surgeon: Sheree Penne  Lonni, MD;  Location: [facility] OR;  Service: Vascular;  Laterality: Right;   POLYPECTOMY     POSTERIOR CERVICAL FUSION/FORAMINOTOMY N/A 06/09/2014   Procedure: Laminectomy - Cervical two-Cervcial four posterior cervical instrumented fusion Cervical two-cervical four;  Surgeon: Alm GORMAN Molt, MD;  Location: [facility] NEURO ORS;  Service: Neurosurgery;  Laterality: N/A;  posterior    RIGHT/LEFT HEART CATH AND CORONARY ANGIOGRAPHY N/A 10/10/2023   Procedure: RIGHT/LEFT HEART CATH AND CORONARY ANGIOGRAPHY;  Surgeon: Elmira Newman PARAS, MD;  Location: [facility] INVASIVE CV LAB;  Service: Cardiovascular;  Laterality: N/A;   SAVORY DILATION N/A 02/05/2018   Procedure: SAVORY DILATION;  Surgeon: Shila Gustav GAILS, MD;  Location: [facility] ENDOSCOPY;  Service: Endoscopy;  Laterality: N/A;   TONSILLECTOMY     as a child    UMBILICAL HERNIA REPAIR N/A 05/14/2013   Procedure: LAPAROSCOPIC exploration and repair of hernia in  abdominal ;  Surgeon: Elspeth KYM Schultze, MD;  Location: [facility] OR;  Service: General;  Laterality: N/A;   UPPER GASTROINTESTINAL ENDOSCOPY  2020    Allergies  Allergen Reactions   Aldactone  Blas.Breath ] Other (See Comments)    Hyperkalemia   Brilinta  [Ticagrelor ] Shortness Of Breath    Numbness in hands and feet Blurry vision   Clopidogrel  Rash    Redness and Itchiness   Codeine Rash and Hives   Amoxicillin  Hives   Atorvastatin      Myalgias with lipitor.  Does tolerate simvastatin .     Benadryl  [Diphenhydramine ] Hives   Januvia  [Sitagliptin ] Other (See Comments)    Diarrhea and heart racing   Jardiance  [Empagliflozin ] Other (See Comments)    Polyuria; excessive weight loss   Lisinopril      Possible cause of pancreatitis   Rosuvastatin  Other (See Comments)    myalgia   Lasix  [Furosemide ] Rash    Current Outpatient Medications  Medication Sig Dispense Refill   aspirin  EC 81 MG tablet Take 1 tablet (81 mg total) by mouth daily. 120 tablet 2   carvedilol  (COREG ) 25 MG tablet TAKE 1 TABLET BY MOUTH TWICE DAILY WITH MEALS 180 tablet 3   Cholecalciferol  (VITAMIN D3) 50 MCG (2000 UT) TABS Take 2,000 Units by mouth in the morning.     Cyanocobalamin  (VITAMIN B-12 PO) Take 2,000 mcg by mouth in the morning.     glipiZIDE  (GLUCOTROL ) 5 MG tablet Take 2 tablets (10 mg total) by mouth daily. With supper.     glucose blood (ACCU-CHEK AVIVA PLUS) test strip USE AS DIRECTED TO TEST BLOOD SUGAR TWICE DAILY 200 strip 3   insulin  glargine (LANTUS  SOLOSTAR) 100 UNIT/ML Solostar Pen INJECT 0.3 TO 0.35 MLS(30 TO 35 UNITS) INTO THE SKIN EVERY DAY 15 mL 3   metFORMIN  (GLUCOPHAGE ) 500 MG tablet TAKE 2 TABLETS BY MOUTH TWICE DAILY WITH FOOD 360 tablet 3   Multiple Vitamin (MULTIVITAMIN WITH MINERALS) TABS tablet Take 1 tablet by mouth in the morning.     omeprazole  (PRILOSEC) 40 MG capsule TAKE 1 CAPSULE(40 MG) BY MOUTH DAILY 90 capsule 3   prasugrel  (EFFIENT ) 10 MG TABS tablet Take 0.5 tablets (5 mg  total) by mouth in the morning and at bedtime.     silver  sulfADIAZINE  (SILVADENE ) 1 % cream Apply 1 Application topically daily. 50 g 3   simvastatin  (ZOCOR ) 20 MG tablet TAKE 1 TABLET(20 MG) BY MOUTH AT BEDTIME 90 tablet 3   torsemide  (DEMADEX ) 20 MG tablet Take 1 tablet (20 mg total) by mouth daily. 90 tablet 2   triamcinolone  ointment (KENALOG ) 0.1 % APPLY  TOPICALLY TO THE AFFECTED AREA TWICE DAILY 454 g 1   triamcinolone  ointment (KENALOG ) 0.1 % Apply topically 2 (two) times daily as needed. 80 g 3   umeclidinium bromide  (INCRUSE ELLIPTA ) 62.5 MCG/ACT AEPB Inhale 1 puff into the lungs daily. 90 each 3   valsartan  (DIOVAN ) 40 MG tablet Take 1 tablet (40 mg total) by mouth daily with supper. 90 tablet 3   No current facility-administered medications for this visit.    Family History  Problem Relation Age of Onset   Hypertension Mother    Stroke Mother    Hyperlipidemia Mother    Lung cancer Father    Diabetes Sister    Heart disease Sister        Before age 26   Hypertension Sister    Hyperlipidemia Sister    Heart attack Sister    Hypertension Son    Colon cancer Neg Hx    Prostate cancer Neg Hx    Esophageal cancer Neg Hx    Stomach cancer Neg Hx    Rectal cancer Neg Hx    Colon polyps Neg Hx    Pancreatic cancer Neg Hx     Social History   Socioeconomic History   Marital status: Married    Spouse name: Willy   Number of children: 1   Years of education: Not on file   Highest education level: Bachelor's degree (e.g., BA, AB, BS)  Occupational History   Occupation: retired  Tobacco Use   Smoking status: Former    Current packs/day: 0.00    Average packs/day: 2.0 packs/day for 42.0 years (84.0 ttl pk-yrs)    Types: Cigarettes    Start date: 1964    Quit date: 05/06/2004    Years since quitting: 19.5    Passive exposure: Never   Smokeless tobacco: Never  Vaping Use   Vaping status: Never Used  Substance and Sexual Activity   Alcohol use: Not Currently   Drug  use: No   Sexual activity: Yes    Partners: Female  Other Topics Concern   Not on file  Social History Narrative   [place] vet, he has known service related agent orange exposure.    Retired   Community education officer daily.     Social Drivers of Corporate investment banker Strain: Low Risk  (10/02/2023)   Overall Financial Resource Strain (CARDIA)    Difficulty of Paying Living Expenses: Not hard at all  Food Insecurity: No Food Insecurity (10/02/2023)   Hunger Vital Sign    Worried About Running Out of Food in the Last Year: Never true    Ran Out of Food in the Last Year: Never true  Transportation Needs: No Transportation Needs (10/02/2023)   PRAPARE - Administrator, Civil Service (Medical): No    Lack of Transportation (Non-Medical): No  Physical Activity: Sufficiently Active (10/02/2023)   Exercise Vital Sign    Days of Exercise per Week: 5 days    Minutes of Exercise per Session: 30 min  Stress: No Stress Concern Present (10/02/2023)   [employer] of Occupational Health - Occupational Stress Questionnaire    Feeling of Stress : Not at all  Social Connections: Unknown (10/02/2023)   Social Connection and Isolation Panel    Frequency of Communication with Friends and Family: Once a week    Frequency of Social Gatherings with Friends and Family: Once a week    Attends Religious Services: Patient declined    Active Member of Clubs or  Organizations: Yes    Attends Banker Meetings: 1 to 4 times per year    Marital Status: Married  Catering manager Violence: Not At Risk (04/24/2023)   Humiliation, Afraid, Rape, and Kick questionnaire    Fear of Current or Ex-Partner: No    Emotionally Abused: No    Physically Abused: No    Sexually Abused: No     REVIEW OF SYSTEMS:   [X]  denotes positive finding, [ ]  denotes negative finding Cardiac  Comments:  Chest pain or chest pressure:    Shortness of breath upon exertion:    Short of breath when lying flat:     Irregular heart rhythm:        Vascular    Pain in calf, thigh, or hip brought on by ambulation:    Pain in feet at night that wakes you up from your sleep:     Blood clot in your veins:    Leg swelling:         Pulmonary    Oxygen  at home:    Productive cough:     Wheezing:         Neurologic    Sudden weakness in arms or legs:     Sudden numbness in arms or legs:     Sudden onset of difficulty speaking or slurred speech:    Temporary loss of vision in one eye:     Problems with dizziness:         Gastrointestinal    Blood in stool:     Vomited blood:         Genitourinary    Burning when urinating:     Blood in urine:        Psychiatric    Major depression:         Hematologic    Bleeding problems:    Problems with blood clotting too easily:        Skin    Rashes or ulcers:        Constitutional    Fever or chills:      PHYSICAL EXAMINATION:  Today's Vitals   11/05/23 1009  BP: 115/72  Pulse: 84  Temp: 97.8 F (36.6 C)  TempSrc: Temporal  SpO2: 99%  Weight: 180 lb 3.2 oz (81.7 kg)  Height: 6' 1 (1.854 m)  PainSc: 0-No pain   Body mass index is 23.77 kg/m.   General:  WDWN in NAD; vital signs documented above Gait: Not observed HENT: WNL, normocephalic Pulmonary: normal non-labored breathing  Abdomen: soft, NT Skin: without rashes Vascular Exam/Pulses: + doppler flow bilateral DP/PT  Extremities: right 3rd toe wound improved; some swelling RLE/foot  Musculoskeletal: no muscle wasting or atrophy  Neurologic: A&O X 3 Psychiatric:  The pt has Normal affect.   Non-Invasive Vascular Imaging:   EVAR Arterial duplex on 10/31/2023: Abdominal Aorta Findings:  +--------+-------+----------+----------+--------+--------+--------+  LocationAP (cm)Trans (cm)PSV (cm/s)WaveformThrombusComments  +--------+-------+----------+----------+--------+--------+--------+  Proximal2.40  2.40                                           +--------+-------+----------+----------+--------+--------+--------+  Mid    2.70   2.70                                          +--------+-------+----------+----------+--------+--------+--------+  Distal 3.00   3.00                                          +--------+-------+----------+----------+--------+--------+--------+   IVC/Iliac Findings:  +--------+------+--------+--------+   IVC   PatentThrombusComments  +--------+------+--------+--------+  IVC Proxpatent                  +--------+------+--------+--------+   Endovascular Aortic Repair (EVAR):  +----------+----------------+-------------------+-------------------+           Diameter AP (cm)Diameter Trans (cm)Velocities (cm/sec)  +----------+----------------+-------------------+-------------------+  Aorta    3.00            3.00               35                   +----------+----------------+-------------------+-------------------+  Right Limb1.50            1.50               37                   +----------+----------------+-------------------+-------------------+  Left Limb 1.50            1.50               48                   +----------+----------------+-------------------+-------------------+   Summary:  Abdominal Aorta:   The largest aortic diameter remains essentially unchanged compared to prior exam. Previous diameter measurement was 3.0 cm obtained on  05/23/2022. No evidence of an endoleak.   Prior CT imaging on 08/21/2021 showed patent infrarenal bifurcated aortic stent graft with 3.3 cm native sac diameter, with no definite endoleak.   ABI's/TBI's on 10/22/2023: Right:  0.62/0.21 - Great toe pressure: 33 Left:  0.87/0.55 - Great toe pressure: 87  Previous EVAR arterial duplex on 05/23/2022: Summary:  Abdominal Aorta: The largest aortic diameter remains essentially unchanged compared to prior duplex exam. Previous diameter measurement was 3.1 cm obtained on  04/12/2021. Prior CT 08/21/2021 measured sac at 3.3 cm.    ASSESSMENT/PLAN:: 78 y.o. male here with hx of extensive right CFA endarterectomy including EIA, profunda and SFA endarterectomy with vein patch angioplasty with right GSV for CLI with tissue loss on 11/19/2022 by Dr. Sheree. He has hx of EVAR 12/03/2011 by Dr. Eliza.   EVAR -duplex reveals it is unchanged from previous study 1.5 years ago.  He is not having any new abdominal or back pain -continue asa/effient /satin -pt will f/u in one year with EVAR duplex.  PAD -right 3rd toe wound improved and now dry. -will have him return in 6 months for ABI.  He knows to call sooner if any issues before then.  -I have asked him to stop the silvadene  and just use the triamcinolone  cream   Lucie Apt, [facility] [phone]  Clinic MD:   Serene on call MD

## 2023-11-05 NOTE — Telephone Encounter (Signed)
 Attempted to give patient a call regarding his PFT order and need of PFT (30 min) prior to his visit.   At this time we do have an opening that I have held for him on 11/11/2023 at 2:30pm prior to his visit with his provider. If patient is to return call, he is needing a PFT prior to his visit.

## 2023-11-10 ENCOUNTER — Other Ambulatory Visit: Payer: Self-pay

## 2023-11-10 DIAGNOSIS — I739 Peripheral vascular disease, unspecified: Secondary | ICD-10-CM

## 2023-11-10 NOTE — Patient Instructions (Signed)
 ICD-10-CM   1. IPF (idiopathic pulmonary fibrosis) (HCC)  J84.112     2. Centrilobular emphysema (HCC)  J43.2     3. Medication monitoring encounter  Z51.81     4. Exercise hypoxemia  R09.02          Re IPF with exercise hypoxemia  Sow progression over time though on cT recently stable but PFT suggessts ongoing progressio Glad heart isi mproved LFT is normal dec 2024  Plan - cotninue esbriet   at 2 pill thress times daily; take with food -Continue  portable oxygen  system 2 L nasal cannula - Continue nighttime oxygen  as before - take consent form for BEACON study - meet Josh from [facility]  - study ends in a few weeks so do read it at home and reflect on it  - Josh and I will see  if you meet criteria  = check LFT in 3 months   Emphysema (minor component)   Stable  Plan -continue spiriva  respimat 2 puff daily  - continue  albuterol  prn  Lung nodules right - cluster - new Aug 2023 - resolved NOv 2023 - NO comment on CT Sept 2024  Plan  -reassure  Rash  - resolved and not due to esbreit per dermagtologist per your history  Plan  - monitor    Followup -  --  face to face vsiit Dr. Geronimo [ideally] or nurse practitioner in 14 weeks but after PFT; 30 min

## 2023-11-10 NOTE — Progress Notes (Unsigned)
 OV 01/02/2021  Subjective:  Patient ID: Luke [name], male , DOB: 07-Aug-1945 , age 78 y.o. , MRN: [id number] , ADDRESS: [address] [place] [address] PCP Cleatus Arlyss RAMAN, MD Patient Care Team: Cleatus Arlyss RAMAN, MD as PCP - General Eliza Lonni RAMAN, MD (Vascular Surgery) Burnette Elsie, MD (Gastroenterology) Sheldon Standing, MD (General Surgery) Joshua Alm RAMAN, MD (Neurosurgery) Abigail Maude POUR (Optometry) Jeffrie Oneil BROCKS, MD (Cardiology) Myra Browning, [facility] as Pharmacist (Pharmacist)  This Provider for this visit: Treatment Team:  Attending Provider: Geronimo Amel, MD    01/02/2021 -transfer of care to the interstitial lung disease center for Dr. Geronimo.  Referral from Dr. Adine Gift Chief Complaint  Patient presents with   Consult    ILD consult per Dr. Gift. Pt states he has been doing okay since last visit and denies any complaints.     HPI Luke [name] 78 y.o. -referred by Dr. Adine Gift for interstitial lung disease evaluation   [facility] [facility] Comprehensive ILD Questionnaire  Symptoms:  -Reports insidious onset of shortness of breath gradually.  It is the same since it started.  He is unclear how long he is headed but he had it for a few years.  There is no cough at all.  There is no clearing of the throat.  There is no fatigue.  His appetite is good no nausea no vomiting no diarrhea no anxiety no depression no chronic pain    Past Medical History :   In 2009 he was diagnosed with systolic heart failure.  He believes etiology is ischemic based on a remote heart attack.  Has a history of diabetes for several years.  Has kidney disease not otherwise specified for several years.  His GFR in May 2022 was greater than 60 with a creatinine 1.23 mg percent.   His last echocardiogram was in 2019.  He sees cardiology Dr. Fernande and Dr. Oneil Jeffrie.  Overall he is deemed to be stable.  He had pacemaker check recently.  Denies any  collagen vascular disease or vasculitis sleep apnea.  Denies any PE.  He has had COVID-vaccine but has not had COVID. ROS:   -Positive for fatigue.  Has some dysphagia for the last few days but otherwise okay.  No nausea no vomiting no heartburn no snoring no rash no ulcers.   FAMILY HISTORY of LUNG DISEASE: Denies   EXPOSURE HISTORY: Smokes cigarettes 20 1966 in 2012 30 cigarettes/day.  He did have some passive smoking.  Did smoke marijuana between 1968 and 1970.  Once a month.  No cocaine use no intravenous drug use   HOME and HOBBY DETAILS : Single-[place] the last 25 years the age of the home is 75 years.  There is no dampness.  No mildew no mold.  No humidifier use no CPAP use no nebulizer use.  No steam iron use no Jacuzzi use.  No misting [place] no pet birds or parakeets no pet gerbils.  No mold in the [facility] duct.  Last checked in 2009.  Does do gardening.  No birds at this no strong mats no water  damage no Jacuzzi.   OCCUPATIONAL HISTORY (122 questions) : He was in [place] and got exposed to agent orange but otherwise detailed questioning is negative other than the fact he worked with some petroleum based cleaning agents.   PULMONARY TOXICITY HISTORY (27 items): He was on hydralazine  in 2019.   HRCT May  2022 - personally visualzied -definite progression is combined emphysema with UIP features.  IMPRESSION: 1. Spectrum of findings compatible with basilar predominant fibrotic interstitial lung disease without frank honeycombing, with mild progression at the lung bases since 03/25/2018 CT abdomen study, with more clear progression since 10/07/2011 CT abdomen study. Findings are categorized as probable UIP per consensus guidelines: Diagnosis of Idiopathic Pulmonary Fibrosis: An Official ATS/ERS/JRS/ALAT Clinical Practice Guideline. Am JINNY Honey Crit Care Med Vol 198, Iss 5, [id number], Jan 04 2017. 2. Scattered small solid pulmonary nodules, largest 5 mm.  Follow-up noncontrast chest CT recommended in 12 months. This recommendation follows the consensus statement: Guidelines for Management of Incidental Pulmonary Nodules Detected on CT Images: From the Fleischner Society 2017; Radiology 2017; 284:228-243. 3. Three-vessel coronary atherosclerosis. 4. Chronic atrophy of pancreatic body and tail, progressive since 2019 CT abdomen study, without discrete mass on this noncontrast CT, indeterminate but presumably due to progressive chronic pancreatitis. 5. Aortic Atherosclerosis (ICD10-I70.0) and Emphysema (ICD10-J43.9).     Electronically Signed   By: Luke CannonD.   On: 10/03/2020 16:53  SErology  Results for DOLPH, TAVANO (MRN [id number]) as of 01/02/2021 14:44  Ref. Range 11/13/2020 16:09  Anti Nuclear Antibody (ANA) Latest Ref Range: NEGATIVE  NEGATIVE  Angiotensin-Converting Enzyme Latest Ref Range: 9 - 67 U/L 27  Cyclic Citrullin Peptide Ab Latest Units: UNITS <16  ds DNA Ab Latest Units: IU/mL <1  Myeloperoxidase Abs Latest Units: AI <1.0  Serine Protease 3 Latest Units: AI <1.0  RA Latex Turbid. Latest Ref Range: <14 IU/mL <14  SSA (Ro) (ENA) Antibody, IgG Latest Ref Range: <1.0 NEG AI <1.0 NEG  SSB (La) (ENA) Antibody, IgG Latest Ref Range: <1.0 NEG AI <1.0 NEG  Scleroderma (Scl-70) (ENA) Antibody, IgG Latest Ref Range: <1.0 NEG AI <1.0 NEG    eCHO arch 2019  Study Conclusions   - Left ventricle: The cavity size was normal. Wall thickness was    normal. Systolic function was normal. The estimated ejection    fraction was in the range of 55% to 60%. Wall motion was normal;    there were no regional wall motion abnormalities. Doppler    parameters are consistent with abnormal left ventricular    relaxation (grade 1 diastolic dysfunction).  - Mitral valve: There was mild regurgitation.  - Pulmonary arteries: Systolic pressure was moderately increased.    PA peak pressure: 40 mm Hg (S).     OV 02/14/2021 -  telephine visit  Subjective:  Patient ID: Luke [name], male , DOB: [birth year] , age 45 y.o. , MRN: [id number] , ADDRESS: [address] [place] [address] PCP Cleatus Arlyss RAMAN, MD Patient Care Team: Cleatus Arlyss RAMAN, MD as PCP - General Eliza Lonni RAMAN, MD (Vascular Surgery) Burnette ELSIE, MD (Gastroenterology) Sheldon Standing, MD (General Surgery) Joshua Alm RAMAN, MD (Neurosurgery) Abigail Maude POUR [facility]) Jeffrie Oneil BROCKS, MD (Cardiology) Myra Browning, [facility] as Pharmacist (Pharmacist)   Type of visit: Telephone/Video Circumstance: COVID-19 national emergency Identification of patient CAYETANO MIKITA with October 31, 1945 and MRN [id number] - 2 person identifier Risks: Risks, benefits, limitations of telephone visit explained. Patient understood and verbalized agreement to proceed Anyone else on call: just patient Patient location: [phone] -  This provider location: [address], [place]  This Provider for this visit: Treatment Team:  Attending Provider: Geronimo Amel, MD    02/14/2021 -  FU IPF with some emphysema. Has sCHF  HPI SERAPHIM TROW 78 y.o. -telephone  visit to see how his esbriet  started is going butt turns out he says he never got referred to pharmacist. Pharmacist confirmed that she is yet to meet iwht him. No esbriet  started. Says even spirivan not started. Also, supposed to be telephone visit but got confusing call and he showed up at front desk only to be sent home. Apologized for gap in customer service. Overall well. He said after last vsit he read more about esbriet  and got apprehensive esp in light of CKD but lab review shows GFR > 60 this year. 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  Instructions   - Slowly increase the dose per protocol  -Always take it with food  -Any nausea you can try ginger capsules  -Give at least between 5 and 6  hours between dosing  -Good idea to participate in manufacturer [place] sponsored medication support program - this is optional  -Definitely apply sunscreen when you go out with this medication  - You will need monthly liver tests for 6 months and thereafter every 3 months  - explained in decreaesd renal functio that side ffects could be higher but his GFR is good enough for full dose  - explained benefit of slowing fibrosis progression and that NNT of 1 to 6 is pretty good   We resolved we will jsu start and titrate up to max dose of 2 pills tid which is 2/3rd normal dose   CT Chest data  No results found.  Results for RUBE, SANCHEZ (MRN [id number]) as of 02/14/2021 10:42  Ref. Range 10/23/2020 10:57  Creatinine Latest Ref Range: 0.76 - 1.27 mg/dL 8.82  Results for DEVONDRE, GUZZETTA (MRN [id number]) as of 02/14/2021 10:42  Ref. Range 01/02/2021 15:40  AST Latest Ref Range: 0 - 37 U/L 17  ALT Latest Ref Range: 0 - 53 U/L 16      OV 05/10/2021  Subjective:  Patient ID: Luke [name], male , DOB: [birth year] , age 49 y.o. , MRN: [id number] , ADDRESS: [address] [place] [address] PCP Cleatus Arlyss RAMAN, MD Patient Care Team: Cleatus Arlyss RAMAN, MD as PCP - General Eliza Lonni RAMAN, MD (Vascular Surgery) Burnette ELSIE, MD (Gastroenterology) Sheldon Standing, MD (General Surgery) Joshua Alm RAMAN, MD (Neurosurgery) Abigail Maude POUR (Optometry) Jeffrie Oneil BROCKS, MD (Cardiology) Myra Browning, [facility] as Pharmacist (Pharmacist)  This Provider for this visit: Treatment Team:  Attending Provider: Geronimo Amel, MD    05/10/2021 -   Chief Complaint  Patient presents with   Follow-up    PFT performed today.  Pt states he has been doing okay since last visit and denies any complaints.   02/14/2021 -  FU IPF with some emphysema. Has sCHF = - esbriet  shipment at his home 03/23/21  - on esbiret 2 pills tid due to CKD hx (ofev 2nd choice due to his heart  issues)     HPI Luke [name] 78 y.o. -presents for follow-up.  Since his last visit he started pirfenidone .  After starting pirfenidone  he developed COVID-19.  In the midst of all this he had to hold his COVID.  He also had diarrhea.  It was not fully clear in November 2022 whether the diarrhea was from COVID or from pirfenidone .  Nevertheless sometime around Thanksgiving he restarted pirfenidone .  He slowly escalated himself within a week to 2 pills 3 times daily.  He is  by choice at 2 pills 3 times daily because of a history of chronic kidney disease although most recent creatinine and GFR were greater than 60.  He says currently is doing 2 pills 3 times daily [submaximal but still therapeutic effect] and is tolerating this well without any GI side effects.  He does have chronic dry skin with flaking.  He says it is refractory.  We discussed about trying flaxseed.  He will do that.  We reviewed his first pulmonary function test.  Between the [facility] and DLCO when averaged he has 60% lung capacity.  He is doing Spiriva  but is not sure it is helping him.  His most recent liver function test was 1 month ago.  He will have another 1 today.        OV 08/08/2021  Subjective:  Patient ID: Luke [name], male , DOB: 1945/09/18 , age 78 y.o. , MRN: [id number] , ADDRESS: [address] [place] [address] PCP Cleatus Arlyss RAMAN, MD Patient Care Team: Cleatus Arlyss RAMAN, MD as PCP - General Eliza Lonni RAMAN, MD (Vascular Surgery) Burnette Elsie, MD (Gastroenterology) Sheldon Standing, MD (General Surgery) Joshua Alm RAMAN, MD (Neurosurgery) Abigail Maude POUR (Optometry) Jeffrie Oneil BROCKS, MD (Cardiology) Myra Browning, [facility] as Pharmacist (Pharmacist)  This Provider for this visit: Treatment Team:  Attending Provider: Geronimo Amel, MD    08/08/2021 -   Chief Complaint  Patient presents with   Follow-up    3 mo f/u for IPF. Stopped the Esbriet  due to increased dizziness combined with the  isosorbide . Will resume in a few weeks.    02/14/2021 -  FU IPF with some emphysema. Ha that particular study the current s sCHF = - esbriet  shipment at his home 03/23/21  - on esbiret 2 pills tid due to CKD hx (ofev 2nd choice due to his heart issues)  HPI Luke [name] 78 y.o. -returns for follow-up.  At this visit the original plan was to increase his pirfenidone  but he tells me that while doing his daily exercises he started noticing chest pain on the treadmill.  This followed eye surgery in January 2023.  He had cardiac cath mid March 2023 and he had 100% blockage in one of his vessels.  His isosorbide  was then increased.  He started feeling dizzy.  He attributed part of the dizziness due to pirfenidone .  He says pirfenidone  was already giving him dizziness and this was made worse by the isosorbide .  He then decided to take his isosorbide  half pill twice daily which helped his dizziness.  But he still was dizzy so he stopped his pirfenidone  and the dizziness improved a lot.  He decided to protect his heart over his lung.  He has upcoming visit with cardiology in Sep 11, 2021.  Till then he wants to be on pirfenidone  holiday.  After that he is definitely intending to restart the pirfenidone .  We discussed nintedanib and because of potential cardiac risk he does not want to do it.  We discussed clinical trials he is interested but he wants to rechallenge himself with pirfenidone  first.  Currently he is walking 2 miles per day on a treadmill at 4 miles an hour without an incline and it takes 30 minutes.  Current symptom score is below.  Labs look okay .  The chest pain is improved  Regarding his dry skin: This is better after flaxseed.  He asked for some advice about reversing atherosclerosis.  I referred him to the [facility]  [place] diet.  He understands this means strict vegetarian/vegan diet.  He admits readily that he is a carnivore.  In the last meat-based diet.   OV 10/03/2021  Subjective:   Patient ID: Luke [name], male , DOB: 1945/07/03 , age 2 y.o. , MRN: [id number] , ADDRESS: [address] [place] [address] PCP Cleatus Arlyss RAMAN, MD Patient Care Team: Cleatus Arlyss RAMAN, MD as PCP - General Jeffrie Oneil BROCKS, MD as PCP - Cardiology (Cardiology) Eliza Lonni RAMAN, MD (Vascular Surgery) Burnette Elsie, MD (Gastroenterology) Sheldon Standing, MD (General Surgery) Joshua Alm RAMAN, MD (Neurosurgery) Abigail Maude POUR (Optometry) Jeffrie Oneil BROCKS, MD (Cardiology) Myra Browning, [facility] as Pharmacist (Pharmacist)  This Provider for this visit: Treatment Team:  Attending Provider: Geronimo Amel, MD  02/14/2021 -  FU IPF with some emphysema. Has sCHF = - esbriet  shipment at his home 03/23/21  - on esbiret 2 pills tid due to CKD hx (ofev kept as 2nd choice due to his heart issues)  -  esbriet  on hold since mid march 2023 due to dizziness made by worse by nitrate and improved after stopping esbriet   - restart late may 2023  10/03/2021 -   Chief Complaint  Patient presents with   Follow-up    Pt states he has been doing okay since last visit and denies any real complaints.     HPI Luke [name] 78 y.o. -returns for follow-up.  He says he is doing well.  The symptoms are minimal.  He continues to exercise.  He says he has difficulty tolerating medication adds.  He has not been stable on the isosorbide  which gave him dizziness in the setting of pirfenidone .  He stopped the pirfenidone  although he attributed the dizziness to the isosorbide .  After stopping pirfenidone  his dizziness went away.  Now for the last 20 days he is also been on valsartan .  He believes it was a new start.  For the last 1 week he is now back on pirfenidone  at 1 pill 3 times daily.  There are no side effects right now.  He is trying to go to 2 pills 3 times daily [the maximum dose and account of his CKD].  He is worried that he can get side effects such as fogginess and headaches.  But he is willing  to take it and monitor.  He is interested in clinical trials but at this point in time his is.  Is not on a stable dose.  His symptom score is stable His pulmonary function test shows stability/improvement in FVC.  Last CT scan of the chest May 2022     OV 01/10/2022  Subjective:  Patient ID: Luke [name], male , DOB: [birth year] , age 49 y.o. , MRN: [id number] , ADDRESS: [address] [place] [address] PCP Cleatus Arlyss RAMAN, MD Patient Care Team: Cleatus Arlyss RAMAN, MD as PCP - General Jeffrie Oneil BROCKS, MD as PCP - Cardiology (Cardiology) Eliza Lonni RAMAN, MD (Vascular Surgery) Burnette Elsie, MD (Gastroenterology) Sheldon Standing, MD (General Surgery) Joshua Alm RAMAN, MD (Neurosurgery) Abigail Maude POUR (Optometry) Jeffrie Oneil BROCKS, MD (Cardiology) Fate Morna SAILOR, [facility] as Pharmacist (Pharmacist)  This Provider for this visit: Treatment Team:  Attending Provider: Geronimo Amel, MD    01/10/2022 -   Chief Complaint  Patient presents with   Follow-up    PFT performed today.  Pt states he has been dong okay since last visit and denies any complaints.   HPI Dominik Yordy Rogel 78  y.o. -returns for follow-up.  He continues to do well.  He is back on pirfenidone  at 2 pills 3 times daily.  He does not want to escalate this further because of dizziness that he feels is because of valsartan  and nitrates.  He has adjusted his valsartan  and nitrate regimen but he still has dizziness.  With the pirfenidone  he feels goofy.  He says the side effects overlapping but somewhat different.  He is able to differentiate the side effects between the 2 but certainly if he increases his pirfenidone  he will get more dizzy.  His shortness of breath is stable his walking desaturation test is stable.  His pulmonary function test is stable.  His CT scan of the chest also shows stability and IPF over 1 year.  Of note he has some new nodules described in the right lower lobe in August 2023 CT chest.   A 63-month follow-up is recommended.  He had recent liver function test and that is normal.   CT Chest data - HRCT Aug 2023  Narrative & Impression  CLINICAL DATA:  Interstitial lung disease   EXAM: CT CHEST WITHOUT CONTRAST   TECHNIQUE: Multidetector CT imaging of the chest was performed following the standard protocol without intravenous contrast. High resolution imaging of the lungs, as well as inspiratory and expiratory imaging, was performed.   RADIATION DOSE REDUCTION: This exam was performed according to the departmental dose-optimization program which includes automated exposure control, adjustment of the mA and/or kV according to patient size and/or use of iterative reconstruction technique.   COMPARISON:  Chest CT dated Oct 03, 2020   FINDINGS: Cardiovascular: Normal heart size. No pericardial effusion. Normal caliber thoracic aorta with severe calcified plaque. Severe coronary artery calcifications of the LAD. Partially visualized left chest wall pacer wires with leads in the right atrium, right ventricle and lead overlying the left ventricle.   Mediastinum/Nodes: Esophagus and thyroid  are unremarkable. No pathologically enlarged lymph nodes seen in the chest.   Lungs/Pleura: Central airways are patent. No significant air trapping. Severe centrilobular emphysema. Subpleural and basilar predominant reticular opacities with associated ground-glass, traction bronchiectasis. Honeycomb change is seen in the medial right lower lobe on series 12 image 199, not significantly changed when compared with prior exam. No definite evidence of progressive fibrosis. New clustered part solid nodules of the right lower lobe, largest nodule measures 17 mm in overall diameter with 6 mm solid component, located on series 12, image 261.   Upper Abdomen: No acute abnormality.   Musculoskeletal: No chest wall mass or suspicious bone lesions identified.   IMPRESSION: 1. Basilar  and subpleural predominant fibrotic interstitial lung disease with traction bronchiectasis and honeycomb change. No evidence of progression. Findings are consistent with UIP per consensus guidelines: Diagnosis of Idiopathic Pulmonary Fibrosis: An Official ATS/ERS/JRS/ALAT Clinical Practice Guideline. Am JINNY Honey Crit Care Med Vol 198, Iss 5, [phone], Jan 04 2017. 2. New clustered part solid pulmonary nodules of the right lower lobe, likely due to infection or aspiration. Recommend follow-up chest CT in 3 months to ensure resolution. 3. Aortic Atherosclerosis (ICD10-I70.0) and Emphysema (ICD10-J43.9).     Electronically Signed   By: Rea Marc CannonD.   On: 12/07/2021 10:54    No results found.   OV 04/04/2022  Subjective:  Patient ID: Luke [name], male , DOB: 04-07-1946 , age 36 y.o. , MRN: [id number] , ADDRESS: [address] [place] [address] PCP Cleatus Arlyss RAMAN, MD Patient Care Team: Cleatus Arlyss RAMAN, MD as  PCP - General Jeffrie Oneil BROCKS, MD as PCP - Cardiology (Cardiology) Eliza Lonni RAMAN, MD (Vascular Surgery) Burnette Fallow, MD (Gastroenterology) Sheldon Standing, MD (General Surgery) Joshua Alm RAMAN, MD (Neurosurgery) Abigail Maude POUR (Optometry) Jeffrie Oneil BROCKS, MD (Cardiology) Fate Morna SAILOR, [facility] as Pharmacist (Pharmacist)  This Provider for this visit: Treatment Team:  Attending Provider: Geronimo Amel, MD     04/04/2022 -   Chief Complaint  Patient presents with   Follow-up    PFT performed today.  Pt states he has been doing okay since last visit and denies any complaints.     HPI Fallow LITTIE [name] 78 y.o. -returns for follow-up.  At this visit he had pulmonary function test that shows stability in FVC but his DLCO is decreased.  Symptom wise he feels stable.  He had a high-resolution CT chest that showed stability in the ILD for over a time.  He believes the DLCO decline is because of his heart failure because he feels good.  He  is able to mow his lawn and raking leaves.  Based on the decline in DLCO he is open to getting his BNP and also CBC checked.  He also wants his kidney function checked.  He is compliant with his medications.  Of note his lung nodules have resolved on the CT chest. LFT normal Nov 2023  He continues on Esbriet  2 pills 3 times daily. 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 - 10 days ago he also fell from the ladder and sprained his ankle     OV 07/29/2022  Subjective:  Patient ID: Fallow LITTIE [name], male , DOB: [birth year] , age 58 y.o. , MRN: [id number] , ADDRESS: [address] [place] [address] PCP Cleatus Arlyss RAMAN, MD Patient Care Team: Cleatus Arlyss RAMAN, MD as PCP - General Jeffrie Oneil BROCKS, MD as PCP - Cardiology (Cardiology) Eliza Lonni RAMAN, MD (Vascular Surgery) Burnette Fallow, MD (Gastroenterology) Sheldon Standing, MD (General Surgery) Joshua Alm RAMAN, MD (Neurosurgery) Abigail Maude POUR (Optometry) Jeffrie Oneil BROCKS, MD (Cardiology) Fate Morna SAILOR, [facility] as Pharmacist (Pharmacist)  This Provider for this visit: Treatment Team:  Attending Provider: Geronimo Amel, MD   07/29/2022 -   Chief Complaint  Patient presents with   Follow-up    Review PFT today.  Doing well.  Using exercise bike at home and gym 3 days a week.     HPI Fallow LITTIE [name] 78 y.o. -returns for follow-up.  At his last visit he was feeling well and his CT scan was stable his FVC was stable but his DLCO had dropped.  Therefore we decided to bring him back a little bit early to recheck his pulmonary function test.  He now tells me at the last pulmonary function test he did not have his dentures and possibly there was an air leak.  Today his FVC and DLCO are all stable.  He feels good.  He is tolerating his pirfenidone  well.  Weight is stable.  He is dealing with other issues other than IPF.  He says his right lower extremity one of the main arteries is got claudication again.   He has a right foot callus that is now an ulcer.  He has to have surgery for both.  He is dealing with these issues.  Most recent liver function test was in February 2024 and pirfenidone  and normal.  His current symptom scores are listed below.  His walking desaturation test is stable.  OV 01/09/2023  Subjective:  Patient ID: Luke [name], male , DOB: 11-05-1945 , age 24 y.o. , MRN: [id number] , ADDRESS: [address] [place] [address] PCP Cleatus Arlyss RAMAN, MD Patient Care Team: Cleatus Arlyss RAMAN, MD as PCP - General Jeffrie Oneil BROCKS, MD as PCP - Cardiology (Cardiology) Fernande Elspeth BROCKS, MD as PCP - Electrophysiology (Cardiology) Eliza Lonni RAMAN, MD (Vascular Surgery) Burnette Elsie, MD (Gastroenterology) Sheldon Elspeth, MD (General Surgery) Joshua Alm Hamilton, MD (Neurosurgery) Abigail Maude POUR (Optometry) Jeffrie Oneil BROCKS, MD (Cardiology) Fate Morna SAILOR, [facility] (Inactive) as Pharmacist (Pharmacist) Green, Davina E, RN as [facility] Management  This Provider for this visit: Treatment Team:  Attending Provider: Geronimo Amel, MD   01/09/2023 -   Chief Complaint  Patient presents with   Follow-up    SOB  PFT 01/09/2023     HPI Luke [name] 78 y.o. -routine follow-up.  Presents with his wife Avelina who I am meeting for the first time.  She is a patient of Dr. Brenna.  He tells me since his last visit he has had 4 coronary stents placed including May and June 2024.  After that he also had allergy to Plavix .  He is also had vascular surgery to his lower extremity.  He says after all this he is now more short of breath.  He feels he is declining.  Dyspnea scale is 11.  He feels oxygen  at night is helping him.  Dyspnea score is definitely worse.  He is tolerating pirfenidone  fine.  In June 2024 his BNP was elevated.  He and his wife are wondering if the decline is because of his heart.  He did have pulmonary function test today.  I  personally visualized the report and showed it to him.  His FVC stable in the last 1 year but his DLCO shows a dramatic decline.  There is no history of anemia.  We did a sit/stand exercise hypoxemia test and is having new onset desaturations below 88%.  There is no orthopnea or paroxysmal nocturnal dyspnea edema.  OV 01/23/2023  Subjective:  Patient ID: Luke [name], male , DOB: May 26, 1945 , age 25 y.o. , MRN: [id number] , ADDRESS: [address] [place] [address] PCP Cleatus Arlyss RAMAN, MD Patient Care Team: Cleatus Arlyss RAMAN, MD as PCP - General Jeffrie Oneil BROCKS, MD as PCP - Cardiology (Cardiology) Fernande Elspeth BROCKS, MD as PCP - Electrophysiology (Cardiology) Eliza Lonni RAMAN, MD (Vascular Surgery) Burnette Elsie, MD (Gastroenterology) Sheldon Elspeth, MD (General Surgery) Joshua Alm Hamilton, MD (Neurosurgery) Abigail Maude POUR (Optometry) Jeffrie Oneil BROCKS, MD (Cardiology) Fate Morna SAILOR, [facility] (Inactive) as Pharmacist (Pharmacist) Green, Davina E, RN as [facility] Care Management  This Provider for this visit: Treatment Team:  Attending Provider: Geronimo Amel, MD  Type of visit: Video Virtual Visit Identification of patient BLADYN TIPPS with 03-18-1946 and MRN [id number] - 2 person identifier Risks: Risks, benefits, limitations of telephone visit explained. Patient understood and verbalized agreement to proceed Anyone else on call: no Patient location: his home This provider location: [address], [address]; [place]; [place] [address]. [facility]. [phone]    01/23/2023 -   Chief Complaint  Patient presents with   Follow-up    Breathing is about the same.      HPI Luke [name] 78 y.o. - video visit for IPF.  As opposed to review his CT scan of the chest.  However the  official report is not done was done 2 days ago.  I personally visualized it.  He does have UIP but I am not sure if the fibrosis worse.  Might be  worse.  I think it is worse.  He is got his oxygen  on but his portable oxygen  that we set up was not ordered by our office.  I apologize.  We are going to take care of that.  He did see cardiology Dr. Jeffrie today.  He is complaining of rash he feels is from Lasix .  It is in his extremities.  He denies it is from Esbriet  although I told him it could be from Esbriet .  He stopped his Lasix .  He was also having dizziness and Dr. Jeffrie stopped/reduced a few other medications.  Otherwise no new issues.  Given the rash I said I will see him back in 6 weeks.   OV 05/13/2023  Subjective:  Patient ID: Luke [name], male , DOB: 29-May-1945 , age 13 y.o. , MRN: [id number] , ADDRESS: [address] [place] [address] PCP Cleatus Arlyss RAMAN, MD Patient Care Team: Cleatus Arlyss RAMAN, MD as PCP - General Jeffrie Oneil BROCKS, MD as PCP - Cardiology (Cardiology) Fernande Elspeth BROCKS, MD as PCP - Electrophysiology (Cardiology) Eliza Lonni RAMAN, MD (Inactive) (Vascular Surgery) Burnette Elsie, MD (Gastroenterology) Sheldon Elspeth, MD (General Surgery) Joshua Alm Hamilton, MD (Neurosurgery) Jeffrie Oneil BROCKS, MD (Cardiology) Fate Morna SAILOR, [facility] (Inactive) as Pharmacist (Pharmacist) Green, Davina E, RN as [facility] Management Dunn, Maude POUR [facility]) Lavonia Lye, MD as Consulting Physician (Ophthalmology)  This Provider for this visit: Treatment Team:  Attending Provider: Hope Almarie ORN, NP    05/13/2023 -   Chief Complaint  Patient presents with   Follow-up    Currently on pred, 3 pills left, pt denies any concerns. Pt did receive POC     HPI Luke [name] 78 y.o. -returns for follow-up.  At last visit he had rash therefore we seeing him sooner.  He did not think the rash was from his Esbriet .  He felt it was from his cardiac medications.  He saw dermatology is currently on steroid taper and his rash is gone.  He is absolutely convinced the rash is not from Esbriet .   The rash was on the lower extremities and nonsun exposed areas.  He also tells me that the shortness of breath is better and effort tolerance is better in association with the prednisone .  But he agreed that the prednisone  is causing hyperglycemia and he does not want to do this long-term.  He is exercising 30 minutes on the treadmill with portable oxygen .  He also uses nighttime oxygen .  These are helping but he does have a chest burn 15 minutes into the exercise and then it gets better.  In terms of his systolic heart failure in September 2024 he had an echo and his systolic heart failure is completely resolved.  He is happy about this.  We discussed the possibility that his pulmonary fibrosis progressing although it is more recently stable.  I did show him that the pulmonary function test recently is also progressing.  I do believe that the pirfenidone  is having beneficial effect but nevertheless it seems to be on some ongoing progression.  Discussed clinical trials as a care option.  He says overall he is exhausted taking a lot of medications but is aware of the fact his fibrosis progressing and there is no other standard of care  therapy add-on available [nintedanib is indicated as a replacement for pirfenidone ].  We talked about him participating in study called beacon with [employer].  He is potentially interested.  He met with the research coordinator and took the consent form.  In terms of this.  Liver function test in December 2024 was normal.   External record review shows no hospitalizations or emergency room visits.   IMPRESSION: 1. 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 Pulmonary parenchymal pattern of fibrosis, as detailed above, stable from 03/27/2022 but progressive from 10/03/2020. Findings are consistent with UIP per consensus guidelines: Diagnosis of Idiopathic Pulmonary Fibrosis: An Official ATS/ERS/JRS/ALAT Clinical Practice Guideline. Am JINNY Honey Crit  Care Med Vol 198, Iss 5, [phone], Jan 04 2017. 2. Aortic atherosclerosis (ICD10-I70.0). Coronary artery calcification     Electronically Signed   By: Newell Eke CannonD.   On: 02/03/2023 15:28    OV 11/11/2023  Subjective:  Patient ID: Luke [name], male , DOB: 1946-04-21 , age 25 y.o. , MRN: [id number] , ADDRESS: [address] [place] [address] PCP Cleatus Arlyss RAMAN, MD Patient Care Team: Cleatus Arlyss RAMAN, MD as PCP - General Jeffrie Oneil BROCKS, MD as PCP - Cardiology (Cardiology) Fernande Elspeth BROCKS, MD as PCP - Electrophysiology (Cardiology) Eliza Lonni RAMAN, MD (Inactive) (Vascular Surgery) Burnette Elsie, MD (Gastroenterology) Sheldon Elspeth, MD (General Surgery) Joshua Alm Hamilton, MD (Neurosurgery) Jeffrie Oneil BROCKS, MD (Cardiology) Abigail Maude POUR [facility]) Lavonia Lye, MD as Consulting Physician (Ophthalmology)  This Provider for this visit: Treatment Team:  Attending Provider: Geronimo Amel, MD     FU IPF with some emphysema. Has sCHF = - esbriet  shipment at his home 03/23/21  - on esbiret 2 pills tid due to CKD hx (ofev kept as 2nd choice due to his heart issues)  -  esbriet  on hold since mid march 2023 due to dizziness made by worse by nitrate and improved after stopping esbriet   - restart late may 2023  -Noiv 2023:  - Stable IPF on high-resolution CT chest , FVC and symptoms November 2023 - stable PFT March 2024  -Progression on CT chest September 2024 versus 2022 but stable compared to 2023. -Pulmonary function test December 2024 with ongoing progression. -Excess hypoxemia started on portable oxygen  fall 2024  #On low-dose pirfenidone  protocol  - Esbriet /Pirfenidone  requires intensive drug monitoring due to high concerns for Adverse effects of , including  Drug Induced Liver Injury, significant GI side effects that include but not limited to Diarrhea, Nausea, Vomiting,  and other system side effects that include Fatigue, headaches, weight loss  and other side effects such as skin rash. These will be monitored with  blood work such as LFT initially once a month for 6 months and then quarterly   Pulmonary nodules resolved November 2023 no mention on CT September 2024   [facility] - who group 3 - Right heart catheterization 10/10/2023: RA: 6 mmHg RV: 53/0 mmHg PA: 52/20 mmHg, mPAP 30 mmHg PCW: 11 mmHg PVR 3.4 WU   AO sats: 91% PA sats: 64%   CO: 5.6 L/min CI: 2.7 L/min/m2   One-vessel obstructive CAD June 2025 Echo June 2024 with normal ejection fraction.  11/11/2023 -   Chief Complaint  Patient presents with   Follow-up    PFT follow up.  Has a blockage in right ventricle, which PT states is causing SOB.      HPI Luke [name] 78 y.o. -returns for follow-up.  Last seen in January 2025.  At that  time we were considering him for a clinical trial but because his FEV1/FVC ratio was slightly below 70 [he has IPF with emphysema with the dominant component being IPF].  He did not qualify.  Fortunately that trial was also withdrawn for safety reasons.  The trial was called beacon-IPF.  In any event since then he says he is got progressive shortness of breath.  He says he limited by shortness of breath affecting his this despite oxygen .  He feels its his heart disease.  He ultimately underwent a right heart cath June 2025 .  He has occlusive one-vessel coronary artery disease but cardiology notes indicate that this is not amenable for correction.  He also got diagnosed with WHO group 3 pulmonary hypertension [see above].  P theology is encouraging pulmonary to help with the shortness of breath.  In terms of his IPF: Currently tolerating pirfenidone  low-dose protocol well.  In the past he could not tolerate high-dose last liver function test June 2025 and normal.  Pulmonary function test today shows further decline.  Exercise hypoxemia is present.  His new diagnosis of WHO group 3 pulmonary hypertension June 2025     SYMPTOM SCALE -  ILD 01/02/2021  08/08/2021 188# -4 miles an hour on a treadmill for 30 minutes.  Covers 2 miles on no incline 10/03/2021  01/10/2022 187# 04/04/2022   07/29/2022  01/09/2023 176# 05/13/2023 Low-dose as per protocol Portable oxygen  Nighttime oxygen   O2 use ra ra ra ra ra ra ra   Shortness of Breath 0 -> 5 scale with 5 being worst (score 6 If unable to do)      0   At rest 0 0 0 0 0 0 0 0  Simple tasks - showers, clothes change, eating, shaving 0 0  0 0 0 1 1  Household (dishes, doing bed, laundry) 0 0 0 0 0 0 1 0  Shopping 0 0 0 0 0 2 1 1   Walking level at own pace 0 0 00 0 0 2 3 3   Walking up Stairs 2 2 2 2 4 2 5 4   Total (30-36) Dyspnea Score 2 2 2 2 4 6 11 10   How bad is your cough? 0 0 00 0 0 0 0 0  How bad is your fatigue 00 0 0 0 0 0 0 0  How bad is nausea 0 0 0 0 0 0 0 0  How bad is vomiting?  0 0 0  0 0 0 0  How bad is diarrhea? 00 0 0 0 0 0 0 0  How bad is anxiety? 0 0 0 00 0 0 00 0  How bad is depression 0 0 0 0 0 0 0 0        Simple office walk 185 feet x  3 laps goal with forehead probe 01/02/2021  08/08/2021  01/10/2022  07/29/2022  01/09/2023  11/11/2023   O2 used ra ra ra ra Ra ra  Number laps completed 3 3 2 3  Sit stand x 10 Sit stand x 10  Comments about pace avg     45 seconds  Resting Pulse Ox/HR 97% and 71/min 98% and HR 71 98% and HR66 98% and HR 65 96% and HR 86 93% with a heart rate of 81.  Symptom score of 0  Final Pulse Ox/HR 95% and 94/min 91% and HR 86 93% and HR 91 92% an dHR 84 84% and HR 94. Corrected with 2L o2 88% with a symptom  score of 4 and heart rate of 100  Desaturated </= 88% no no No no    Desaturated <= 3% points no Yes, 7 poins Yes 5 ponts Yes 6 potn    Got Tachycardic >/= 90/min yes no yes no    Symptoms at end of test none none none none Level 2 dyspea   Miscellaneous comments x    worsening    CT Chest data from date:   - personally visualized and independently interpreted : NO    - my findings are: as below  IMPRESSION: 1. Combined  emphysema (ICD10-J43.9) and interstitial lung disease. Pulmonary parenchymal pattern of fibrosis, as detailed above, stable from 03/27/2022 but progressive from 10/03/2020. Findings are consistent with UIP per consensus guidelines: Diagnosis of Idiopathic Pulmonary Fibrosis: An Official ATS/ERS/JRS/ALAT Clinical Practice Guideline. Am JINNY Honey Crit Care Med Vol 198, Iss 5, [phone], Jan 04 2017. 2. Aortic atherosclerosis (ICD10-I70.0). Coronary artery calcification     Electronically Signed   By: Newell Eke CannonD.   On: 02/03/2023 15:28    PFT     Latest Ref Rng & Units 11/11/2023    2:12 PM 04/25/2023   12:45 PM 01/09/2023   11:01 AM 07/29/2022   10:16 AM 04/04/2022    1:20 PM 01/10/2022    1:42 PM 10/02/2021    8:45 AM  PFT Results  FVC-Pre L 3.30  P 3.53  3.76  3.83  3.79  3.73  4.09   FVC-Predicted Pre % 73  P 77  82  83  82  81  88   FVC-Post L 3.19  P        FVC-Predicted Post % 70  P        Pre FEV1/FVC % % 65  P 68  66  67  68  69  68   Post FEV1/FCV % % 67  P        FEV1-Pre L 2.14  P 2.39  2.48  2.56  2.57  2.56  2.77   FEV1-Predicted Pre % 65  P 72  75  77  77  77  82   FEV1-Post L 2.13  P        DLCO uncorrected ml/min/mmHg 7.97  P 8.22  6.86  11.69  9.71  11.84  13.10   DLCO UNC% % 30  P 30  25  43  36  44  48   DLCO corrected ml/min/mmHg 8.72  P 8.22   11.69  9.71  11.84  13.45   DLCO COR %Predicted % 33  P 30   43  36  44  50   DLVA Predicted % 51  P 45  35  56  47  67  62   TLC L 5.18  P  6.05       TLC % Predicted % 69  P  81       RV % Predicted % 69  P  84         P Preliminary result      LAB RESULTS last 96 hours No results found.       has a past medical history of AAA (abdominal aortic aneurysm) (HCC) (2011), Angio-edema, Atrial fibrillation (HCC), CAD (coronary artery disease), Cardiomyopathy (HCC), Chronic kidney disease, COVID-19, Diabetes mellitus, GERD (gastroesophageal reflux disease), HLD (hyperlipidemia), Hypertension, ICD  (implantable cardioverter-defibrillator) in place, IPF (idiopathic pulmonary fibrosis) (HCC), LBBB (left bundle branch block), Low testosterone, On home oxygen  therapy, Pacemaker, PAD (  peripheral artery disease) (HCC), Pancreatitis, Peripheral arterial disease (HCC), and Umbilical hernia.   reports that he quit smoking about 19 years ago. His smoking use included cigarettes. He started smoking about 61 years ago. He has a 84 pack-year smoking history. He has never been exposed to tobacco smoke. He has never used smokeless tobacco.  Past Surgical History:  Procedure Laterality Date   ABDOMINAL AORTAGRAM N/A 10/28/2011   Procedure: ABDOMINAL EZELLA;  Surgeon: Lonni GORMAN Blade, MD;  Location: [facility] CATH LAB;  Service: Cardiovascular;  Laterality: N/A;   ABDOMINAL AORTIC ANEURYSM REPAIR     EVAR    ABDOMINAL AORTOGRAM N/A 05/27/2022   Procedure: ABDOMINAL AORTOGRAM;  Surgeon: Sheree Penne Lonni, MD;  Location: [facility] INVASIVE CV LAB;  Service: Cardiovascular;  Laterality: N/A;   BI-VENTRICULAR IMPLANTABLE CARDIOVERTER DEFIBRILLATOR N/A 07/16/2012   Procedure: BI-VENTRICULAR IMPLANTABLE CARDIOVERTER DEFIBRILLATOR  (CRT-D);  Surgeon: Elspeth JAYSON Sage, MD;  Location: [facility] CATH LAB;  Service: Cardiovascular;  Laterality: N/A;   BIV ICD GENERATOR CHANGEOUT N/A 10/25/2020   Procedure: BIV ICD GENERATOR CHANGEOUT;  Surgeon: Sage Elspeth JAYSON, MD;  Location: [facility] INVASIVE CV LAB;  Service: Cardiovascular;  Laterality: N/A;   CARDIAC CATHETERIZATION     CATARACT EXTRACTION Left 2023   COLONOSCOPY  03/23/2018; 2020   CORONARY CTO INTERVENTION N/A 10/10/2022   Procedure: CORONARY CTO INTERVENTION;  Surgeon: Dann Candyce GORMAN, MD;  Location: [facility] INVASIVE CV LAB;  Service: Cardiovascular;  Laterality: N/A;   CORONARY LITHOTRIPSY N/A 09/18/2022   Procedure: CORONARY LITHOTRIPSY;  Surgeon: Dann Candyce GORMAN, MD;  Location: [facility] INVASIVE CV LAB;  Service: Cardiovascular;  Laterality: N/A;   CORONARY STENT  INTERVENTION N/A 09/18/2022   Procedure: CORONARY STENT INTERVENTION;  Surgeon: Dann Candyce GORMAN, MD;  Location: [facility] INVASIVE CV LAB;  Service: Cardiovascular;  Laterality: N/A;   CORONARY ULTRASOUND/IVUS N/A 09/18/2022   Procedure: Coronary Ultrasound/IVUS;  Surgeon: Dann Candyce GORMAN, MD;  Location: [facility] INVASIVE CV LAB;  Service: Cardiovascular;  Laterality: N/A;   ENDARTERECTOMY FEMORAL Right 11/19/2022   Procedure: RIGHT COMMON FEMORAL ENDARTERECTOMY WITH VEIN PATCH ANGIOPLASTY;  Surgeon: Sheree Penne Lonni, MD;  Location: [facility] OR;  Service: Vascular;  Laterality: Right;   ESOPHAGOGASTRODUODENOSCOPY N/A 02/12/2019   Procedure: ESOPHAGOGASTRODUODENOSCOPY (EGD);  Surgeon: Charlanne Groom, MD;  Location: [facility] ENDOSCOPY;  Service: Endoscopy;  Laterality: N/A;   ESOPHAGOGASTRODUODENOSCOPY (EGD) WITH PROPOFOL  N/A 02/05/2018   Procedure: ESOPHAGOGASTRODUODENOSCOPY (EGD) WITH PROPOFOL ;  Surgeon: Shila Gustav GAILS, MD;  Location: [facility] ENDOSCOPY;  Service: Endoscopy;  Laterality: N/A;   ESOPHAGOGASTRODUODENOSCOPY (EGD) WITH PROPOFOL  N/A 09/27/2018   Procedure: ESOPHAGOGASTRODUODENOSCOPY (EGD) WITH PROPOFOL ;  Surgeon: Rollin Dover, MD;  Location: [facility] ENDOSCOPY;  Service: Endoscopy;  Laterality: N/A;   FOREIGN BODY REMOVAL  09/27/2018   Procedure: FOREIGN BODY REMOVAL;  Surgeon: Rollin Dover, MD;  Location: [facility] ENDOSCOPY;  Service: Endoscopy;;   FOREIGN BODY REMOVAL  02/12/2019   Procedure: FOREIGN BODY REMOVAL;  Surgeon: Charlanne Groom, MD;  Location: [facility] ENDOSCOPY;  Service: Endoscopy;;   HERNIA REPAIR  05/14/2013   INSERTION OF MESH N/A 05/14/2013   Procedure: INSERTION OF MESH;  Surgeon: Elspeth KYM Schultze, MD;  Location: [facility] OR;  Service: General;  Laterality: N/A;   LEFT HEART CATH N/A 10/10/2022   Procedure: Left Heart Cath;  Surgeon: Dann Candyce GORMAN, MD;  Location: [facility] INVASIVE CV LAB;  Service: Cardiovascular;  Laterality: N/A;   LEFT HEART CATH AND CORONARY ANGIOGRAPHY N/A 07/17/2021    Procedure: LEFT HEART CATH AND CORONARY ANGIOGRAPHY;  Surgeon: Burnard Debby LABOR, MD;  Location: [facility] INVASIVE CV LAB;  Service: Cardiovascular;  Laterality: N/A;   LEFT HEART CATH AND CORONARY ANGIOGRAPHY N/A 09/18/2022   Procedure: LEFT HEART CATH AND CORONARY ANGIOGRAPHY;  Surgeon: Dann Candyce RAMAN, MD;  Location: [facility] INVASIVE CV LAB;  Service: Cardiovascular;  Laterality: N/A;   LOWER EXTREMITY ANGIOGRAM Bilateral 10/28/2011   Procedure: LOWER EXTREMITY ANGIOGRAM;  Surgeon: Lonni RAMAN Blade, MD;  Location: [facility] CATH LAB;  Service: Cardiovascular;  Laterality: Bilateral;   LOWER EXTREMITY ANGIOGRAM Right 11/19/2022   Procedure: RIGHT LOWER EXTREMITY ANGIOGRAM;  Surgeon: Sheree Penne Lonni, MD;  Location: [facility] OR;  Service: Vascular;  Laterality: Right;   LOWER EXTREMITY ANGIOGRAPHY  05/27/2022   Procedure: Lower Extremity Angiography;  Surgeon: Sheree Penne Lonni, MD;  Location: [facility] INVASIVE CV LAB;  Service: Cardiovascular;;   PACEMAKER INSERTION  07/16/2012   pacemaker/defibrilator   PATCH ANGIOPLASTY Right 11/19/2022   Procedure: VEIN PATCH ANGIOPLASTY;  Surgeon: Sheree Penne Lonni, MD;  Location: [facility] OR;  Service: Vascular;  Laterality: Right;   POLYPECTOMY     POSTERIOR CERVICAL FUSION/FORAMINOTOMY N/A 06/09/2014   Procedure: Laminectomy - Cervical two-Cervcial four posterior cervical instrumented fusion Cervical two-cervical four;  Surgeon: Alm RAMAN Molt, MD;  Location: [facility] NEURO ORS;  Service: Neurosurgery;  Laterality: N/A;  posterior    RIGHT/LEFT HEART CATH AND CORONARY ANGIOGRAPHY N/A 10/10/2023   Procedure: RIGHT/LEFT HEART CATH AND CORONARY ANGIOGRAPHY;  Surgeon: Elmira Newman PARAS, MD;  Location: [facility] INVASIVE CV LAB;  Service: Cardiovascular;  Laterality: N/A;   SAVORY DILATION N/A 02/05/2018   Procedure: SAVORY DILATION;  Surgeon: Shila Gustav GAILS, MD;  Location: [facility] ENDOSCOPY;  Service: Endoscopy;  Laterality: N/A;   TONSILLECTOMY     as a child    UMBILICAL  HERNIA REPAIR N/A 05/14/2013   Procedure: LAPAROSCOPIC exploration and repair of hernia in abdominal ;  Surgeon: Elspeth KYM Schultze, MD;  Location: [facility] OR;  Service: General;  Laterality: N/A;   UPPER GASTROINTESTINAL ENDOSCOPY  2020    Allergies  Allergen Reactions   Aldactone  [Spironolactone ] Other (See Comments)    Hyperkalemia   Brilinta  [Ticagrelor ] Shortness Of Breath    Numbness in hands and feet Blurry vision   Clopidogrel  Rash    Redness and Itchiness   Codeine Rash and Hives   Amoxicillin  Hives   Atorvastatin      Myalgias with lipitor.  Does tolerate simvastatin .     Benadryl  [Diphenhydramine ] Hives   Januvia  [Sitagliptin ] Other (See Comments)    Diarrhea and heart racing   Jardiance  [Empagliflozin ] Other (See Comments)    Polyuria; excessive weight loss   Lisinopril      Possible cause of pancreatitis   Rosuvastatin  Other (See Comments)    myalgia   Lasix  [Furosemide ] Rash    Immunization History  Administered Date(s) Administered   Fluad Quad(high Dose 65+) 02/23/2020, 04/13/2021   Influenza Split 01/31/2011, 02/11/2012   Influenza Whole 02/27/2010   Influenza, High Dose Seasonal PF 02/08/2022   Influenza,inj,Quad PF,6+ Mos 03/18/2013, 03/25/2014, 01/19/2015, 02/21/2016, 02/27/2017, 02/12/2018, 02/18/2019   Influenza-Unspecified 03/03/2020   PFIZER(Purple Top)SARS-COV-2 Vaccination 05/23/2019, 06/12/2019, 02/08/2020, 10/09/2020   Pfizer Covid-19 Vaccine Bivalent Booster 6yrs & up 08/02/2021, 03/19/2023   Pneumococcal Conjugate-13 10/18/2014   Pneumococcal Polysaccharide-23 11/14/2011   Td 07/05/2010    Family History  Problem Relation Age of Onset   Hypertension Mother    Stroke Mother    Hyperlipidemia Mother    Lung cancer Father    Diabetes Sister    Heart disease Sister  Before age 56   Hypertension Sister    Hyperlipidemia Sister    Heart attack Sister    Hypertension Son    Colon cancer Neg Hx    Prostate cancer Neg Hx    Esophageal  cancer Neg Hx    Stomach cancer Neg Hx    Rectal cancer Neg Hx    Colon polyps Neg Hx    Pancreatic cancer Neg Hx      Current Outpatient Medications:    aspirin  EC 81 MG tablet, Take 1 tablet (81 mg total) by mouth daily., Disp: 120 tablet, Rfl: 2   carvedilol  (COREG ) 25 MG tablet, TAKE 1 TABLET BY MOUTH TWICE DAILY WITH MEALS, Disp: 180 tablet, Rfl: 3   Cholecalciferol  (VITAMIN D3) 50 MCG (2000 UT) TABS, Take 2,000 Units by mouth in the morning., Disp: , Rfl:    Cyanocobalamin  (VITAMIN B-12 PO), Take 2,000 mcg by mouth in the morning., Disp: , Rfl:    glipiZIDE  (GLUCOTROL ) 5 MG tablet, Take 2 tablets (10 mg total) by mouth daily. With supper., Disp: , Rfl:    glucose blood (ACCU-CHEK AVIVA PLUS) test strip, USE AS DIRECTED TO TEST BLOOD SUGAR TWICE DAILY, Disp: 200 strip, Rfl: 3   insulin  glargine (LANTUS  SOLOSTAR) 100 UNIT/ML Solostar Pen, INJECT 0.3 TO 0.35 MLS(30 TO 35 UNITS) INTO THE SKIN EVERY DAY, Disp: 15 mL, Rfl: 3   metFORMIN  (GLUCOPHAGE ) 500 MG tablet, TAKE 2 TABLETS BY MOUTH TWICE DAILY WITH FOOD, Disp: 360 tablet, Rfl: 3   Multiple Vitamin (MULTIVITAMIN WITH MINERALS) TABS tablet, Take 1 tablet by mouth in the morning., Disp: , Rfl:    omeprazole  (PRILOSEC) 40 MG capsule, TAKE 1 CAPSULE(40 MG) BY MOUTH DAILY, Disp: 90 capsule, Rfl: 3   prasugrel  (EFFIENT ) 10 MG TABS tablet, Take 0.5 tablets (5 mg total) by mouth in the morning and at bedtime., Disp: , Rfl:    silver  sulfADIAZINE  (SILVADENE ) 1 % cream, Apply 1 Application topically daily., Disp: 50 g, Rfl: 3   simvastatin  (ZOCOR ) 20 MG tablet, TAKE 1 TABLET(20 MG) BY MOUTH AT BEDTIME, Disp: 90 tablet, Rfl: 3   torsemide  (DEMADEX ) 20 MG tablet, Take 1 tablet (20 mg total) by mouth daily., Disp: 90 tablet, Rfl: 2   triamcinolone  ointment (KENALOG ) 0.1 %, APPLY TOPICALLY TO THE AFFECTED AREA TWICE DAILY, Disp: 454 g, Rfl: 1   triamcinolone  ointment (KENALOG ) 0.1 %, Apply topically 2 (two) times daily as needed., Disp: 80 g, Rfl: 3    umeclidinium bromide  (INCRUSE ELLIPTA ) 62.5 MCG/ACT AEPB, Inhale 1 puff into the lungs daily., Disp: 90 each, Rfl: 3   valsartan  (DIOVAN ) 40 MG tablet, Take 1 tablet (40 mg total) by mouth daily with supper., Disp: 90 tablet, Rfl: 3      Objective:   Vitals:   11/11/23 1602 11/11/23 1603  BP: 126/68 126/68  Pulse: 91 91  Temp: 97.7 F (36.5 C) 97.7 F (36.5 C)  TempSrc:  Oral  SpO2:  93%  Weight: 181 lb (82.1 kg) 181 lb (82.1 kg)  Height: 6' (1.829 m) 6' (1.829 m)    Estimated body mass index is 24.55 kg/m as calculated from the following:   Height as of this encounter: 6' (1.829 m).   Weight as of this encounter: 181 lb (82.1 kg).  @WEIGHTCHANGE @  [employer]   11/11/23 1602 11/11/23 1603  Weight: 181 lb (82.1 kg) 181 lb (82.1 kg)     Physical Exam   General: No distress. Looks well O2 at rest:  no Cane present: no Sitting in wheel chair: no Frail: no Obese: no Neuro: Alert and Oriented x 3. GCS 15. Speech normal Psych: Pleasant Resp:  Barrel Chest - no.  Wheeze - no, Crackles - YE BASE, No overt respiratory distress CVS: Normal heart sounds. Murmurs - no Ext: Stigmata of Connective Tissue Disease - no HEENT: Normal upper airway. PEERL +. No post nasal drip        Assessment:       ICD-10-CM   1. IPF (idiopathic pulmonary fibrosis) (HCC)  J84.112     2. WHO group 3 pulmonary arterial hypertension (HCC)  I27.23     3. Centrilobular emphysema (HCC)  J43.2     4. DOE (dyspnea on exertion)  R06.09          Plan:     Patient Instructions     ICD-10-CM   1. IPF (idiopathic pulmonary fibrosis) (HCC)  J84.112     2. Centrilobular emphysema (HCC)  J43.2     3. Medication monitoring encounter  Z51.81     4. Exercise hypoxemia  R09.02          Re IPF with exercise hypoxemia   - On the breathing test pulmonary fibrosis is slow and steadily getting worse with every breathing test particularly since March 2024 and September 2024. - Liver  function test normal in June 2025   Plan - cotninue esbriet   at 2 pill thress times daily; take with food  - Do not recommend full dose pirfenidone  because of side effect profile  - Do not recommend nintedanib because of cardiac risk -Consider Nerandomilast upon approval in early 2026 - Check repeat liver function test in 3 months  -Continue  portable oxygen  system 2 L nasal cannula - Continue nighttime oxygen  as before    Emphysema (minor component)   Stable  Plan -continue spiriva  respimat 2 puff daily  - continue  albuterol  prn     WHO group 3 pulmonary hypertension new diagnosis June 2025  Plan - Start Tyvaso [prescription being sent to our pharmacist because a special order]   Dyspnea on exertion  0 explained to him it is multifactorial and that all of the above conditions are contributing to it  Plan - Individual components  Lung nodules right - cluster - new Aug 2023 - resolved NOv 2023 - NO comment on CT Sept 2024  Plan  -reassure     Followup -  --  face to face vsiit Dr. Geronimo  or nurse practitioner in 8  weeks ; 30-minute visit   FOLLOWUP Return in about 8 weeks (around 01/06/2024) for 30 min visit, with any of the APPS, with Dr Geronimo, Face to Face Visit.    SIGNATURE    Dr. Dorethia Geronimo, CannonD., F.C.C.P,  Pulmonary and Critical Care Medicine Staff Physician, [facility] Center Director - Interstitial Lung Disease  Program  Pulmonary Fibrosis [facility] at [facility] [place], [place], [address]  Pager: [phone], If no answer or between  15:00h - 7:00h: call [phone] Telephone: [phone]  4:46 PM 11/11/2023

## 2023-11-11 ENCOUNTER — Telehealth: Payer: Self-pay | Admitting: Internal Medicine

## 2023-11-11 ENCOUNTER — Encounter

## 2023-11-11 ENCOUNTER — Ambulatory Visit (INDEPENDENT_AMBULATORY_CARE_PROVIDER_SITE_OTHER): Admitting: Internal Medicine

## 2023-11-11 ENCOUNTER — Encounter: Payer: Self-pay | Admitting: Internal Medicine

## 2023-11-11 ENCOUNTER — Ambulatory Visit: Admitting: Internal Medicine

## 2023-11-11 VITALS — BP 126/68 | HR 91 | Temp 97.7°F | Ht 72.0 in | Wt 181.0 lb

## 2023-11-11 DIAGNOSIS — J432 Centrilobular emphysema: Secondary | ICD-10-CM | POA: Diagnosis not present

## 2023-11-11 DIAGNOSIS — R0609 Other forms of dyspnea: Secondary | ICD-10-CM | POA: Diagnosis not present

## 2023-11-11 DIAGNOSIS — J84112 Idiopathic pulmonary fibrosis: Secondary | ICD-10-CM

## 2023-11-11 DIAGNOSIS — R918 Other nonspecific abnormal finding of lung field: Secondary | ICD-10-CM

## 2023-11-11 DIAGNOSIS — I2723 Pulmonary hypertension due to lung diseases and hypoxia: Secondary | ICD-10-CM

## 2023-11-11 DIAGNOSIS — Z87891 Personal history of nicotine dependence: Secondary | ICD-10-CM

## 2023-11-11 LAB — PULMONARY FUNCTION TEST
DL/VA % pred: 51 %
DL/VA: 2.01 ml/min/mmHg/L
DLCO cor % pred: 33 %
DLCO cor: 8.72 ml/min/mmHg
DLCO unc % pred: 30 %
DLCO unc: 7.97 ml/min/mmHg
FEF 25-75 Post: 1.03 L/s
FEF 25-75 Pre: 1.1 L/s
FEF2575-%Change-Post: -5 %
FEF2575-%Pred-Post: 44 %
FEF2575-%Pred-Pre: 47 %
FEV1-%Change-Post: 0 %
FEV1-%Pred-Post: 65 %
FEV1-%Pred-Pre: 65 %
FEV1-Post: 2.13 L
FEV1-Pre: 2.14 L
FEV1FVC-%Change-Post: 3 %
FEV1FVC-%Pred-Pre: 89 %
FEV6-%Change-Post: -2 %
FEV6-%Pred-Post: 75 %
FEV6-%Pred-Pre: 77 %
FEV6-Post: 3.19 L
FEV6-Pre: 3.29 L
FEV6FVC-%Change-Post: 0 %
FEV6FVC-%Pred-Post: 106 %
FEV6FVC-%Pred-Pre: 105 %
FVC-%Change-Post: -3 %
FVC-%Pred-Post: 70 %
FVC-%Pred-Pre: 73 %
FVC-Post: 3.19 L
FVC-Pre: 3.3 L
Post FEV1/FVC ratio: 67 %
Post FEV6/FVC ratio: 100 %
Pre FEV1/FVC ratio: 65 %
Pre FEV6/FVC Ratio: 99 %
RV % pred: 69 %
RV: 1.89 L
TLC % pred: 69 %
TLC: 5.18 L

## 2023-11-11 NOTE — Progress Notes (Signed)
 Full PFT performed today.

## 2023-11-11 NOTE — Patient Instructions (Signed)
 Full PFT performed today.

## 2023-11-11 NOTE — Telephone Encounter (Signed)
 Luke [name]  Tyvaso new start. DPI maybe? PVR < 4     SIGNATURE    Dr. Dorethia Cave, M.D., F.C.C.P,  Pulmonary and Critical Care Medicine Staff Physician, [facility] Center Director - Interstitial Lung Disease  Program  Pulmonary Fibrosis [facility] at [facility] [place], [place], [address]   Pager: [phone], If no answer  -> Check AMION or Try [phone] Telephone (clinical office): [phone] Telephone (research): [phone]  4:40 PM 11/11/2023

## 2023-11-12 ENCOUNTER — Telehealth: Payer: Self-pay | Admitting: *Deleted

## 2023-11-12 NOTE — Telephone Encounter (Signed)
 Submitted a Prior Authorization request to [employer] for TYVASO DPI via CoverMyMeds. Will update once we receive a response.  Key: [id number]

## 2023-11-12 NOTE — Telephone Encounter (Signed)
 Entresto  paperwork initiated and placed in pharmacy box up front.  Nothing further needed.

## 2023-11-12 NOTE — Telephone Encounter (Signed)
 Received new start paperwork for Tyvaso DPI. Will work on completion  Received notification from [employer] regarding a prior authorization for TYVASO DPI. Authorization has been APPROVED from 11/12/2023 to 11/11/2024. Approval letter sent to scan center.  Sherry Pennant, PharmD, MPH, BCPS, CPP Clinical Pharmacist (Rheumatology and Pulmonology)

## 2023-11-14 ENCOUNTER — Telehealth: Payer: Self-pay | Admitting: Pharmacist

## 2023-11-14 NOTE — Telephone Encounter (Signed)
 Patient is Luke [name] new start  Received notification from [facility] regarding a prior authorization for Luke [name]. Authorization has been APPROVED from 11/12/2023 to 11/11/2024. Approval letter sent to scan center.  Luke enrollment form pending completion   [name], PharmD, MPH, BCPS, CPP Clinical Pharmacist (Rheumatology and Pulmonology)

## 2023-11-14 NOTE — Telephone Encounter (Signed)
 Submitted completed referral paperwork to [employer] for TYVASO DPI along with HRCT, right heart cath results, and most recent progress note.  Fax# [phone] Phone# [phone]

## 2023-11-17 NOTE — Telephone Encounter (Signed)
 Received fax from [facility]. Tyvaso referral has been received and triaged to [facility] (phone: [phone])

## 2023-11-18 ENCOUNTER — Other Ambulatory Visit (HOSPITAL_COMMUNITY)

## 2023-11-19 ENCOUNTER — Telehealth: Payer: Self-pay

## 2023-11-19 NOTE — Telephone Encounter (Signed)
 Copied from CRM [id number]. Topic: Clinical - Prescription Issue >> Nov 19, 2023  1:39 PM Isabell A wrote: Reason for CRM: Patient states for Tyvesco he needs his diagnosis faxed to [employer] [phone] for a grant for his copay - his ID # [id number] needs to be on there as well. Requesting a call back once faxed.   Callback number: [phone]

## 2023-11-21 NOTE — Telephone Encounter (Signed)
 Chart notes with documentation of diagnosis has been faxed to [employer] [facility]. ID# included in subject line.

## 2023-11-24 ENCOUNTER — Ambulatory Visit: Attending: Cardiology

## 2023-11-24 DIAGNOSIS — Z95810 Presence of automatic (implantable) cardiac defibrillator: Secondary | ICD-10-CM | POA: Diagnosis not present

## 2023-11-24 DIAGNOSIS — I5042 Chronic combined systolic (congestive) and diastolic (congestive) heart failure: Secondary | ICD-10-CM | POA: Diagnosis not present

## 2023-11-25 ENCOUNTER — Other Ambulatory Visit: Payer: Self-pay | Admitting: Cardiology

## 2023-11-25 ENCOUNTER — Other Ambulatory Visit: Payer: Self-pay | Admitting: Family Medicine

## 2023-11-25 ENCOUNTER — Other Ambulatory Visit: Payer: Self-pay | Admitting: Pharmacist

## 2023-11-25 ENCOUNTER — Telehealth: Payer: Self-pay | Admitting: Family Medicine

## 2023-11-25 ENCOUNTER — Telehealth: Payer: Self-pay

## 2023-11-25 DIAGNOSIS — I5022 Chronic systolic (congestive) heart failure: Secondary | ICD-10-CM

## 2023-11-25 NOTE — Progress Notes (Unsigned)
 EPIC Encounter for ICM Monitoring  Patient Name: Luke [name] is a 78 y.o. male Date: 11/25/2023 Primary Care Physican: Cleatus Arlyss RAMAN, MD Primary Cardiologist: Skains/Sabharwal (as needed) Electrophysiologist: Camnitz Bi-V Pacing: >99%          03/24/2023 Weight: 173.6 lbs 04/15/2023 Weight: 174.2 lbs 05/01/2023 Weight: 170 lbs      05/21/2023: Weight: 170 lbs    06/17/2023 Weight: 179 lbs         06/20/2023 Weight: 175.5 lbs      07/30/2023 Weight: 175-178 lbs 10/06/2023 Office Weight: 181 lbs                                    Attempted call to patient and unable to reach.  Left detailed message per DPR regarding transmission.  Transmission results reviewed.    CorVue thoracic impedance suggesting possible fluid accumulation starting 7/18 and trending back close to baseline.  Also suggesting possible fluid from 7/11-7/16.   Prescribed:  Torsemide  20 mg take 1 tablet(s) (20 mg total) by mouth daily.  06/17/23 He reports Dr Gardenia discussed with him to take extra Torsemide  x 2 days only if needed for fluid symptoms.   Labs: 10/06/2023 Creatinine 1.47, BUN 35, Potassium 4.7, Sodium 134, GFR 45.89 04/24/2023 Creatinine 1.33, BUN 37, Potassium 5.5, Sodium 139, GFR 55 01/09/2023 Creatinine 1.24, BUN 28, Potassium 4.6, Sodium 135, GFR 56.58 A complete set of results can be found in Results Review.   Recommendations: Left voice mail with ICM number and encouraged to call if experiencing any fluid symptoms.   Follow-up plan: ICM clinic phone appointment on 12/29/2023.   91 day device clinic remote transmission 01/21/2024.     EP/Cardiology Office Visits:  Recall 12/12/2023 with Dr. Fernande  12/08/2023 with Dr Jeffrie.     Copy of ICM check sent to Dr. Inocencio.   3 month ICM trend: 11/24/2023.    12-14 Month ICM trend:     Mitzie RAMAN Garner, RN 11/25/2023 9:18 AM

## 2023-11-25 NOTE — Telephone Encounter (Unsigned)
 Copied from CRM [id number]. Topic: Clinical - Medication Refill >> Nov 25, 2023  1:23 PM Sidney E wrote: Medication: insulin  glargine (LANTUS  SOLOSTAR) 100 UNIT/ML Solostar Pen  Has the patient contacted their pharmacy? No pharmacy does not anything to do with this  This is the patient's preferred pharmacy:   Patient picks up medication from office   Is this the correct pharmacy for this prescription? Yes If no, delete pharmacy and type the correct one.   Has the prescription been filled recently? Yes  Is the patient out of the medication? No  Has the patient been seen for an appointment in the last year OR does the patient have an upcoming appointment? No  Can we respond through MyChart? Yes  Agent: Please be advised that Rx refills may take up to 3 business days. We ask that you follow-up with your pharmacy.

## 2023-11-25 NOTE — Progress Notes (Signed)
 Brief Telephone Documentation Reason for Call: Patient left message regarding question about PAP  Summary of Call: ENTRESTO   Entresto  Patient assistance in progress.  Patient income documents received.   Valsartan  is prescribed by Jeffrie Oneil BROCKS, MD Entresto  last prescribed by Walker, Caitlin S, NP 49/51 mg twice daily (cost-prohibitive)  Current Regimen: Valsartan  40 mg once daily  Patient assistance application FAX to cardiology office for completion of prescription component  BREZTRI Patient notes that he no longer needs the Breztri application completed. Has been started on Tyvaso and has been enrolled in their assistance program.   AZ&Me application discarded per patient request  LANTUS  Patient notes he received a call ~2 weeks ago from [employer] that his Lantus  would ship within 10 days. He has one full pen remaining (~10 day supply) Assured him this is usually 10 business days which would mean his medication should ship this week. He will contact program to check status of his order.  Patient to contact clinic should the program say medication has been delivered.  He is aware we will call him as soon as shipment is received.   Follow Up: Patient given direct line for further questions/concerns.  Manuelita FABIENE Kobs, PharmD Clinical Pharmacist [facility] [phone]

## 2023-11-25 NOTE — Telephone Encounter (Signed)
 Patient states that he pick up this prescription, Lantus , for your office at [facility]

## 2023-11-25 NOTE — Telephone Encounter (Signed)
 Remote ICM transmission received.  Attempted call to patient regarding ICM remote transmission and left message to return call with ICM number.

## 2023-11-25 NOTE — Telephone Encounter (Signed)
 Copied from CRM [id number]. Topic: General - Other >> Nov 25, 2023  1:17 PM Donna BRAVO wrote: Reason for CRM: patient calling asking for [facility] pharmacist phone number, Manuelita has been working with him paper work regarding medication. Patient dropped off paperwork for [place] around the first of June.

## 2023-11-25 NOTE — Telephone Encounter (Signed)
 Closing encounter. Patient has reached out to the pharmacist

## 2023-11-26 ENCOUNTER — Encounter (HOSPITAL_BASED_OUTPATIENT_CLINIC_OR_DEPARTMENT_OTHER): Payer: Self-pay

## 2023-11-26 NOTE — Telephone Encounter (Signed)
  Patient can call [employer] to check shipping status at [phone]

## 2023-11-26 NOTE — Telephone Encounter (Signed)
 Received fax from [employer]. Shipment of Tyvaso DPI is still pending consent from patient

## 2023-11-27 NOTE — Telephone Encounter (Signed)
 [employer] grant dx verification form completed and faxed. Luke [name], with [facility] notified  Fax: [phone]

## 2023-11-27 NOTE — Telephone Encounter (Signed)
 Received email form Arland with [facility]- they enrolled patient into grant through [facility] but fund access is pending dx confirmation. Advised that we have not received a form to confirm dx. Requested she email or fax to me directly

## 2023-12-03 NOTE — Telephone Encounter (Signed)
 Received fax from [facility]. Tyvaso DPI dispensed on 12/02/2023

## 2023-12-03 NOTE — Telephone Encounter (Signed)
 Received fax from [facility] stating [facility] is pending the pharmacist counsel call with patient  Sherry Pennant, PharmD, MPH, BCPS, CPP Clinical Pharmacist (Rheumatology and Pulmonology)

## 2023-12-04 ENCOUNTER — Other Ambulatory Visit: Payer: Self-pay

## 2023-12-04 ENCOUNTER — Ambulatory Visit (INDEPENDENT_AMBULATORY_CARE_PROVIDER_SITE_OTHER): Payer: Medicare Other | Admitting: Allergy & Immunology

## 2023-12-04 VITALS — BP 110/62 | HR 90 | Temp 98.0°F | Resp 16 | Ht 70.87 in | Wt 179.3 lb

## 2023-12-04 DIAGNOSIS — T50905D Adverse effect of unspecified drugs, medicaments and biological substances, subsequent encounter: Secondary | ICD-10-CM

## 2023-12-04 DIAGNOSIS — R0602 Shortness of breath: Secondary | ICD-10-CM | POA: Diagnosis not present

## 2023-12-04 DIAGNOSIS — R234 Changes in skin texture: Secondary | ICD-10-CM | POA: Diagnosis not present

## 2023-12-04 DIAGNOSIS — Z888 Allergy status to other drugs, medicaments and biological substances status: Secondary | ICD-10-CM

## 2023-12-04 MED ORDER — TRIAMCINOLONE ACETONIDE 0.1 % EX OINT: TOPICAL_OINTMENT | Freq: Two times a day (BID) | CUTANEOUS | 3 refills | Status: DC

## 2023-12-04 NOTE — Progress Notes (Unsigned)
 FOLLOW UP  Date of Service/Encounter:  12/04/23   Assessment:   Peeling skin - markedly improved (normal LFTs and inflammatory markers, ruling out DRESS)   Adverse reaction to Plavix  and Brilinta  and Lasix     Shortness of breath - with a history of idiopathic pulmonary fibrosis (starting a new inhaler today) Stage 3 kidney disease   No atopic history  Plan/Recommendations:   1. Peeling skin with likely adverse drug reaction - Triamcinolone  sent in today - I am so glad that your rash is much better.   2. Idiopathic pulmonary fibrosis - Good luck with the Tyvesco.  - I hope that this helps with your shortness of breath.   3. Return in about 6 months (around 06/05/2024). You can have the follow up appointment with Dr. Iva or a Nurse Practicioner (our Nurse Practitioners are excellent and always have Physician oversight!).    Subjective:   Luke [name] is a 78 y.o. male presenting today for follow up of  Chief Complaint  Patient presents with  . Follow-up    Swollen right leg and foot .    Luke [name] has a history of the following: Patient Active Problem List   Diagnosis Date Noted  . PAD (peripheral artery disease) (HCC) 11/19/2022  . Acute on chronic systolic (congestive) heart failure (HCC) 10/29/2022  . Pressure injury of skin 10/29/2022  . Cellulitis 10/20/2022  . Angina pectoris (HCC) 10/10/2022  . CAD (coronary artery disease) 09/18/2022  . CKD (chronic kidney disease) stage 3, GFR 30-59 ml/min (HCC) 09/18/2022  . AKI (acute kidney injury) (HCC) 08/21/2021  . Enteritis 08/21/2021  . IPF (idiopathic pulmonary fibrosis) (HCC) 03/21/2021  . Pain due to onychomycosis of toenails of both feet 08/10/2019  . Chronic HFrEF (heart failure with reduced ejection fraction) (HCC) 06/08/2019  . Biventricular ICD (implantable cardioverter-defibrillator) in place 06/08/2019  . Esophageal obstruction due to food impaction   . Esophageal stricture   .  Dysphagia 11/19/2017  . Pancreatitis 11/10/2017  . Tinnitus 05/23/2015  . Advance care planning 10/20/2014  . S/P cervical spinal fusion 06/09/2014  . Stenosis of cervical spine with myelopathy (HCC) 12/27/2013  . Central cord syndrome (HCC) 06/24/2013  . Status post abdominal aortic aneurysm repair 12/30/2012  . HLD (hyperlipidemia) 09/16/2012  . SK (seborrheic keratosis) 02/12/2012  . Ischemic cardiomyopathy   . LBBB (left bundle branch block)   . AAA (abdominal aortic aneurysm) (HCC) 10/08/2010  . PVD (peripheral vascular disease) (HCC) 10/08/2010  . ORGANIC IMPOTENCE 07/05/2010  . Essential hypertension, benign 02/27/2010  . Type 2 diabetes mellitus with hyperlipidemia (HCC) 11/16/2009    History obtained from: chart review and patient.  Discussed the use of AI scribe software for clinical note transcription with the patient and/or guardian, who gave verbal consent to proceed.  Luke [name] is a 78 y.o. male presenting for a follow up visit.  I last saw him in January 2025.  At that time, he was asking about drugs relating to his peeling skin.  I informed him that we do not have a good way to test for the individual drugs.  His rash was better anyway.   Since last visit, he has largely done fairly well.  Asthma/Respiratory Symptom History: He recently started Tyvesco for pulmonary hypertension.  He does start about the last day or 2.  This is a ramp-up medication and he has to increase the dose over a week or 2.   Allergic Rhinitis Symptom History: ***  Food Allergy Symptom  History: ***  Skin Symptom History: ***  GERD Symptom History: ***  Infection Symptom History: ***  Otherwise, there have been no changes to his past medical history, surgical history, family history, or social history.    Review of systems otherwise negative other than that mentioned in the HPI.    Objective:   Blood pressure 110/62, pulse 90, temperature 98 F (36.7 C), temperature source  Temporal, resp. rate 16, height 5' 10.87 (1.8 m), weight 179 lb 4.8 oz (81.3 kg), SpO2 92%. Body mass index is 25.1 kg/m.    Physical Exam   Diagnostic studies: {Blank single:19197::none,deferred due to recent antihistamine use,deferred due to insurance stipulations that require a separate visit for testing,labs sent instead, }  Spirometry: {Blank single:19197::results normal (FEV1: ***%, FVC: ***%, FEV1/FVC: ***%),results abnormal (FEV1: ***%, FVC: ***%, FEV1/FVC: ***%)}.    {Blank single:19197::Spirometry consistent with mild obstructive disease,Spirometry consistent with moderate obstructive disease,Spirometry consistent with severe obstructive disease,Spirometry consistent with possible restrictive disease,Spirometry consistent with mixed obstructive and restrictive disease,Spirometry uninterpretable due to technique,Spirometry consistent with normal pattern}. {Blank single:19197::Albuterol /Atrovent nebulizer,Xopenex/Atrovent nebulizer,Albuterol  nebulizer,Albuterol  four puffs via MDI,Xopenex four puffs via MDI} treatment given in clinic with {Blank single:19197::significant improvement in FEV1 per ATS criteria,significant improvement in FVC per ATS criteria,significant improvement in FEV1 and FVC per ATS criteria,improvement in FEV1, but not significant per ATS criteria,improvement in FVC, but not significant per ATS criteria,improvement in FEV1 and FVC, but not significant per ATS criteria,no improvement}.  Allergy Studies: {Blank single:19197::none,deferred due to recent antihistamine use,deferred due to insurance stipulations that require a separate visit for testing,labs sent instead, }    {Blank single:19197::Allergy testing results were read and interpreted by myself, documented by clinical staff., }      Marty Shaggy, MD  [facility] 

## 2023-12-04 NOTE — Patient Instructions (Addendum)
 1. Peeling skin with likely adverse drug reaction - Triamcinolone  sent in today - I am so glad that your rash is much better.   2. Idiopathic pulmonary fibrosis - Good luck with the Tyvesco.  - I hope that this helps with your shortness of breath.   3. Return in about 6 months (around 06/05/2024). You can have the follow up appointment with Dr. Iva or a Nurse Practicioner (our Nurse Practitioners are excellent and always have Physician oversight!).    Please inform us  of any Emergency Department visits, hospitalizations, or changes in symptoms. Call us  before going to the ED for breathing or allergy symptoms since we might be able to fit you in for a sick visit. Feel free to contact us  anytime with any questions, problems, or concerns.  It was a pleasure to see you again today!  Websites that have reliable patient information: 1. American Academy of Asthma, Allergy, and Immunology: www.aaaai.org 2. Food Allergy Research and Education (FARE): foodallergy.org 3. Mothers of Asthmatics: http://www.asthmacommunitynetwork.org 4. American College of Allergy, Asthma, and Immunology: www.acaai.org      "Like" us  on Facebook and Instagram for our latest updates!      A healthy democracy works best when [employer] participate! Make sure you are registered to vote! If you have moved or changed any of your contact information, you will need to get this updated before voting! Scan the QR codes below to learn more!

## 2023-12-05 ENCOUNTER — Encounter: Payer: Self-pay | Admitting: Allergy & Immunology

## 2023-12-08 ENCOUNTER — Encounter (HOSPITAL_BASED_OUTPATIENT_CLINIC_OR_DEPARTMENT_OTHER): Payer: Self-pay | Admitting: Cardiology

## 2023-12-08 ENCOUNTER — Ambulatory Visit (HOSPITAL_BASED_OUTPATIENT_CLINIC_OR_DEPARTMENT_OTHER): Admitting: Cardiology

## 2023-12-08 VITALS — BP 134/66 | HR 94 | Ht 72.0 in | Wt 181.4 lb

## 2023-12-08 DIAGNOSIS — E785 Hyperlipidemia, unspecified: Secondary | ICD-10-CM | POA: Diagnosis not present

## 2023-12-08 DIAGNOSIS — I2510 Atherosclerotic heart disease of native coronary artery without angina pectoris: Secondary | ICD-10-CM

## 2023-12-08 DIAGNOSIS — J849 Interstitial pulmonary disease, unspecified: Secondary | ICD-10-CM

## 2023-12-08 DIAGNOSIS — I739 Peripheral vascular disease, unspecified: Secondary | ICD-10-CM

## 2023-12-08 DIAGNOSIS — Z95810 Presence of automatic (implantable) cardiac defibrillator: Secondary | ICD-10-CM | POA: Diagnosis not present

## 2023-12-08 DIAGNOSIS — I2729 Other secondary pulmonary hypertension: Secondary | ICD-10-CM | POA: Diagnosis not present

## 2023-12-08 DIAGNOSIS — I5042 Chronic combined systolic (congestive) and diastolic (congestive) heart failure: Secondary | ICD-10-CM | POA: Diagnosis not present

## 2023-12-08 NOTE — Patient Instructions (Signed)
 Medication Instructions:  The current medical regimen is effective;  continue present plan and medications.  *If you need a refill on your cardiac medications before your next appointment, please call your pharmacy*  Follow-Up: At [facility], you and your health needs are our priority.  As part of our continuing mission to provide you with exceptional heart care, our providers are all part of one team.  This team includes your primary Cardiologist (physician) and Advanced Practice Providers or APPs (Physician Assistants and Nurse Practitioners) who all work together to provide you with the care you need, when you need it.  Your next appointment:   6 month(s)  Provider:   Dorothye Gathers, MD    We recommend signing up for the patient portal called "MyChart".  Sign up information is provided on this After Visit Summary.  MyChart is used to connect with patients for Virtual Visits (Telemedicine).  Patients are able to view lab/test results, encounter notes, upcoming appointments, etc.  Non-urgent messages can be sent to your provider as well.   To learn more about what you can do with MyChart, go to [email or web address].

## 2023-12-08 NOTE — Progress Notes (Signed)
 Cardiology Office Note:  .   Date:  12/08/2023  ID:  Luke [name], DOB Feb 10, 1946, MRN [id number] PCP: Cleatus Arlyss RAMAN, MD   HeartCare Providers Cardiologist:  Oneil Parchment, MD Electrophysiologist:  Elspeth Sage, MD     History of Present Illness: .   Luke [name] is a 78 y.o. male Discussed the use of AI scribe software for clinical note transcription with the patient, who gave verbal consent to proceed.  History of Present Illness Luke [name] is a 78 year old male with pulmonary hypertension and coronary artery disease who presents for follow-up after starting Tyvaso.  Dyspnea and exercise tolerance - Pulmonary hypertension with idiopathic pulmonary fibrosis - Started Tyvaso one week ago with significant improvement in shortness of breath - Able to walk more easily than before Tyvaso initiation - Describes Tyvaso as 'sucking up my oxygen ' but experiences symptomatic relief - Breath improved temporarily after most recent heart catheterization in May of last year, then returned to baseline - Uses supplemental oxygen  as needed  Coronary artery disease and cardiac history - History of coronary artery disease with previous stent placement - Multiple heart catheterizations, most recent this year - History of myocardial infarction discovered on PET scan, previously unrecognized - Reports development of natural bypasses around coronary blockages - Complete heart block with ICD and CRT device in place  Medication management and side effects - Current medications include simvastatin  20 mg, carvedilol  25 mg, valsartan  40 mg, and Tyvaso - Takes Effient  (prasugrel ) in two halves due to dizziness with full dose - Discontinued baby aspirin  - Blood pressure monitored four times daily, stable around 134/60 mmHg  Peripheral neuropathy and edema - Neuropathy present - Intermittent right foot swelling, attributed to lymphatic issues - Left foot without  swelling      ROS:   Studies Reviewed: .        Results Heart catheterization: Collateral circulation development with natural bypasses compensating for blocked stent in the right coronary artery. (09/2021)     Risk Assessment/Calculations:            Physical Exam:   VS:  BP 134/66 (BP Location: Left Arm, Patient Position: Sitting, Cuff Size: Normal)   Pulse 94   Ht 6' (1.829 m)   Wt 181 lb 6.4 oz (82.3 kg)   SpO2 93%   BMI 24.60 kg/m    Wt Readings from Last 3 Encounters:  12/08/23 181 lb 6.4 oz (82.3 kg)  12/04/23 179 lb 4.8 oz (81.3 kg)  11/11/23 181 lb (82.1 kg)    GEN: Well nourished, well developed in no acute distress NECK: No JVD; No carotid bruits CARDIAC: RRR, no murmurs, no rubs, no gallops RESPIRATORY:  Clear to auscultation without rales, wheezing or rhonchi  ABDOMEN: Soft, non-tender, non-distended EXTREMITIES:  No edema; No deformity   ASSESSMENT AND PLAN: .    Assessment and Plan Assessment & Plan Coronary artery disease with prior stent occlusion and collateral formation Chronic coronary artery disease with prior stent occlusion Proximal RCA. Collateral circulation has developed, compensating for the occlusion. No acute intervention required as natural bypasses are functioning effectively.  Secondary pulmonary hypertension associated with idiopathic pulmonary fibrosis Secondary pulmonary hypertension due to idiopathic pulmonary fibrosis/ heart. Currently on Tyvaso, which has shown improvement in symptoms. - Increase Tyvaso dosage to 32, 48, and 64 as tolerated directed by pulmonary.  - Monitor blood pressure four times daily  Complete heart block with cardiac resynchronization therapy device Complete heart block managed  with ICD and cardiac resynchronization therapy. Device functioning well with no reported issues.  Idiopathic pulmonary fibrosis Idiopathic pulmonary fibrosis with associated crackles on lung exam. Managed with supplemental  oxygen  as needed. Tyvaso may provide additional benefit for pulmonary fibrosis. - Continue supplemental oxygen  as needed  Peripheral neuropathy Peripheral neuropathy present, contributing to symptoms in lower extremities.  Mild lower extremity edema Mild lower extremity edema, possibly related to lymphatic drainage issues. - compression  Aspirin  intolerance Intolerance to aspirin , currently managed with Effient . Effient  is taken in divided doses due to side effects such as dizziness. - Continue Effient , half a tablet twice daily         Dispo: 6 mth  Signed, Oneil Parchment, MD

## 2023-12-18 ENCOUNTER — Ambulatory Visit: Payer: Self-pay | Admitting: Internal Medicine

## 2023-12-18 NOTE — Telephone Encounter (Signed)
 Pt states after increasing Tyvaso from 16 mg to 32 mg, he started experiencing increased sob.His [facility] nurse Susie advised that he should report this to Dr. Geronimo. He says he currently feels stable may it was his body getting acclimated to the dose increase. He is concerned though what will happen when he increases dose again. He is aware message will be passed along to provider as he will be back in office on Monday 12/22/23. Patient ok with this.

## 2023-12-18 NOTE — Telephone Encounter (Signed)
 FYI Only or Action Required?: Action required by provider: clinical question for provider and patient was instructed to call by [facility] Nurse-patient states shortness of breath is only with activity and he recovers quickly.  Patient is followed in Pulmonology for IPF, last seen on 11/11/2023 by Geronimo Amel, MD.  Called Nurse Triage reporting Shortness of Breath.  Symptoms began a week ago.  Interventions attempted: Other: Tyvaso.  Symptoms are: unchanged.  Triage Disposition: See HCP Within 4 Hours (Or PCP Triage)  Patient/caregiver understands and will follow disposition?: No, wishes to speak with PCP  Copied from CRM #[id number]. Topic: Clinical - Red Word Triage >> Dec 18, 2023  9:09 AM Leila BROCKS wrote: Red Word that prompted transfer to Nurse Triage: Patient [phone] states is having side effects Tyvesco 32mg  are shortness of breath, dizziness, blood pressure dropping, and oxygen  is low. Patient denies fever nor pain. Patient states was advised by [facility] nurse Susie that handles the aftercare to contact Dr. Geronimo for consult. Reason for Disposition  [1] MILD difficulty breathing (e.g., minimal/no SOB at rest, SOB with walking, pulse < 100) AND [2] NEW-onset or WORSE than normal  Answer Assessment - Initial Assessment Questions 1. RESPIRATORY STATUS: Describe your breathing? (e.g., wheezing, shortness of breath, unable to speak, severe coughing)      Shortness of breath with 32 MG 2. ONSET: When did this breathing problem begin?      Chronic shortness of breath-patient states his shortness of breath is better but states Tyvaso 32 MG shortness of breath is more present than when patient was on 16 MG 3. PATTERN Does the difficult breathing come and go, or has it been constant since it started?      Intermittent-patient states this is with activity.  4. SEVERITY: How bad is your breathing? (e.g., mild, moderate, severe)      Mild-moderate 5. RECURRENT SYMPTOM: Have  you had difficulty breathing before? If Yes, ask: When was the last time? and What happened that time?      yes 6. CARDIAC HISTORY: Do you have any history of heart disease? (e.g., heart attack, angina, bypass surgery, angioplasty)      Pacemaker, CHF, CAD 7. LUNG HISTORY: Do you have any history of lung disease?  (e.g., pulmonary embolus, asthma, emphysema)     Pulmonary fibrosis 8. CAUSE: What do you think is causing the breathing problem?      Tyvesco 32 mg dosage per patient report 9. OTHER SYMPTOMS: Do you have any other symptoms? (e.g., chest pain, cough, dizziness, fever, runny nose)     Dizziness at times.  10. O2 SATURATION MONITOR:  Do you use an oxygen  saturation monitor (pulse oximeter) at home? If Yes, ask: What is your reading (oxygen  level) today? What is your usual oxygen  saturation reading? (e.g., 95%)       91% on room air. Patient does wear oxygen  at night and as needing throughout the day. Patient reports low oxygen  readings without oxygen  88-89%. Patient states his pulse oximeter readings on oxygen  are 95%.  12. TRAVEL: Have you traveled out of the country in the last month? (e.g., travel history, exposures)       no  Patient speaking in full sentences with no audible signs of distress. Patient was started on Tyvaso two weeks ago-started 16 mg-no side effects with this dose. Patient was increased to 32 mg-reports shortness of breath, dizziness, blood pressure dropping and low oxygen  readings. Patient reports intermittent low blood pressure readings after taking Tyvaso. Patient endorses  low blood pressure readings are not consistent but states he had reading of 82/55 on 8/11 which was the last low reading he had. Patient reports his blood pressure did rebound. Blood pressure readings have been within normal limits since than. Patient was told to contact Dr. Geronimo for recommendations. Patient's [facility] nurse Susie has been assisting patient with the  medication and recommended patient calling.  Protocols used: Breathing Difficulty-A-AH

## 2023-12-22 NOTE — Telephone Encounter (Signed)
 I called and spoke with the pt  I notified him of MR's response  He states that what he has done is- 7 days on 16 mcg, and then he is currently on day 11 of He is tolerating well  He wonders if he could do a total of 21 days on the 32 mcg before moving up  He has already discussed this with his [facility] RN  Please advise if this is okay with you, thanks   Note that pt's next visit with you here is on 02/09/24.

## 2023-12-22 NOTE — Telephone Encounter (Signed)
 Recommend he stay at 16mcg for 10 days and then got to 32mcg and stay there for 10 days before any escalation. This might allow body to adjust but if it does not then we can discuss next steps. Some 25% of patients do have tolrance problems with tyvaso

## 2023-12-29 ENCOUNTER — Ambulatory Visit: Attending: Cardiology

## 2023-12-29 DIAGNOSIS — I5042 Chronic combined systolic (congestive) and diastolic (congestive) heart failure: Secondary | ICD-10-CM | POA: Diagnosis not present

## 2023-12-29 DIAGNOSIS — Z95810 Presence of automatic (implantable) cardiac defibrillator: Secondary | ICD-10-CM | POA: Diagnosis not present

## 2023-12-31 NOTE — Progress Notes (Signed)
Remote ICD Transmission.

## 2024-01-01 NOTE — Progress Notes (Signed)
 EPIC Encounter for ICM Monitoring  Patient Name: Luke [name] is a 78 y.o. male Date: 01/01/2024 Primary Care Physican: Cleatus Arlyss RAMAN, MD Primary Cardiologist: Skains/Sabharwal (as needed) Electrophysiologist: Camnitz Bi-V Pacing: >99%          03/24/2023 Weight: 173.6 lbs 04/15/2023 Weight: 174.2 lbs 05/01/2023 Weight: 170 lbs      05/21/2023: Weight: 170 lbs    06/17/2023 Weight: 179 lbs         06/20/2023 Weight: 175.5 lbs      07/30/2023 Weight: 175-178 lbs 10/06/2023 Office Weight: 181 lbs                                    Attempted call to patient and unable to reach.  Left detailed message per DPR regarding transmission.  Transmission results reviewed.    CorVue thoracic impedance suggesting intermittent days with possible fluid accumulation within the last month.   Prescribed:  Torsemide  20 mg take 1 tablet(s) (20 mg total) by mouth daily.  06/17/23 He reports Dr Gardenia discussed with him to take extra Torsemide  x 2 days only if needed for fluid symptoms.   Labs: 10/06/2023 Creatinine 1.47, BUN 35, Potassium 4.7, Sodium 134, GFR 45.89 04/24/2023 Creatinine 1.33, BUN 37, Potassium 5.5, Sodium 139, GFR 55 01/09/2023 Creatinine 1.24, BUN 28, Potassium 4.6, Sodium 135, GFR 56.58 A complete set of results can be found in Results Review.   Recommendations:  Left voice mail with ICM number and encouraged to call if experiencing any fluid symptoms.   Follow-up plan: ICM clinic phone appointment on 02/02/2024.   91 day device clinic remote transmission 01/21/2024.     EP/Cardiology Office Visits:  01/02/2024 with Bynum Arthur, PA.   Recall 06/05/2024 with Dr Jeffrie.     Copy of ICM check sent to Dr. Inocencio.   3 month ICM trend: 12/29/2023.    12-14 Month ICM trend:     Mitzie RAMAN Garner, RN 01/01/2024 7:41 AM

## 2024-01-02 ENCOUNTER — Ambulatory Visit: Attending: Physician Assistant | Admitting: Physician Assistant

## 2024-01-02 ENCOUNTER — Encounter: Payer: Self-pay | Admitting: Physician Assistant

## 2024-01-02 VITALS — BP 130/79 | HR 75 | Ht 73.0 in | Wt 181.0 lb

## 2024-01-02 DIAGNOSIS — I255 Ischemic cardiomyopathy: Secondary | ICD-10-CM | POA: Diagnosis not present

## 2024-01-02 DIAGNOSIS — Z95810 Presence of automatic (implantable) cardiac defibrillator: Secondary | ICD-10-CM | POA: Insufficient documentation

## 2024-01-02 DIAGNOSIS — I2510 Atherosclerotic heart disease of native coronary artery without angina pectoris: Secondary | ICD-10-CM | POA: Insufficient documentation

## 2024-01-02 LAB — CUP PACEART INCLINIC DEVICE CHECK
Battery Remaining Longevity: 55 mo
Brady Statistic RA Percent Paced: 15 %
Brady Statistic RV Percent Paced: 99.67 %
Date Time Interrogation Session: 20250829175439
HighPow Impedance: 83.25 Ohm
Implantable Lead Connection Status: 753985
Implantable Lead Connection Status: 753985
Implantable Lead Connection Status: 753985
Implantable Lead Implant Date: 20140313
Implantable Lead Implant Date: 20140313
Implantable Lead Implant Date: 20140313
Implantable Lead Location: 753858
Implantable Lead Location: 753859
Implantable Lead Location: 753860
Implantable Pulse Generator Implant Date: 20220622
Lead Channel Impedance Value: 387.5 Ohm
Lead Channel Impedance Value: 400 Ohm
Lead Channel Impedance Value: 850 Ohm
Lead Channel Pacing Threshold Amplitude: 0.75 V
Lead Channel Pacing Threshold Amplitude: 0.75 V
Lead Channel Pacing Threshold Amplitude: 0.875 V
Lead Channel Pacing Threshold Amplitude: 1.75 V
Lead Channel Pacing Threshold Pulse Width: 0.5 ms
Lead Channel Pacing Threshold Pulse Width: 0.5 ms
Lead Channel Pacing Threshold Pulse Width: 0.5 ms
Lead Channel Pacing Threshold Pulse Width: 0.6 ms
Lead Channel Sensing Intrinsic Amplitude: 3 mV
Lead Channel Sensing Intrinsic Amplitude: 7.5 mV
Lead Channel Setting Pacing Amplitude: 1.875
Lead Channel Setting Pacing Amplitude: 2 V
Lead Channel Setting Pacing Amplitude: 2.25 V
Lead Channel Setting Pacing Pulse Width: 0.5 ms
Lead Channel Setting Pacing Pulse Width: 0.6 ms
Lead Channel Setting Sensing Sensitivity: 0.5 mV
Pulse Gen Serial Number: 810030647

## 2024-01-02 NOTE — Progress Notes (Signed)
  Cardiology Office Note:  .   Date:  01/02/2024  ID:  Luke [name], DOB 07-Sep-1945, MRN [id number] PCP: Luke Arlyss RAMAN, MD  [facility] Providers Cardiologist:  Luke Parchment, MD Electrophysiologist:  Luke Sage, MD {  History of Present Illness: .   Luke [name] is a 79 y.o. male w/PMHx of  P.HTN, ideopathic p.fibrosis (PRN O2) Peripheral neuropathy, DM, HTN, HLD CAD (prior RCA PCI,  evidence of prior MI on PET, last cath 2023) ICM, LBBB PVD (right lower extremity revascularization 6/24) CHB > CRT-D  He saw Luke [name] 12/17/22: Hx of diaphrag stim 6/13 LHC   [id number]; RCA diff 50-60%  3/14 Echo   30-35 %    3/19 Echo  55-65%    3/23 Echo  40-45 %    3/23  LHC    LADp 80% RCA T with L>>R collaterals  5/24 LHC   LADp/m 80>>stent>>0    6/24 LHC      6/24 Echo  60-65% RCAd 99>>stent>>0 RCAp 75>>stent>>0   RCAm 75>>stent>>0   He saw Luke [name] 12/08/23, neuropathy and some edema, suspect 2/2 lymph edema > rec compression   Today's visit is scheduled as an annual visit ROS:   He is doing well No new issues, symptoms, or concerns since his visit with Luke [name]. Denies any dizzy spells, near syncope or syncope No device shocks Neuropathy keeps him from being able to do too much but still gets on his stationary bike ~ at a time  Device information [employer] CRT-D, implanted 07/16/2012, gen change 10/25/20   Studies Reviewed: Luke [name]    EKG not done today  DEVICE interrogation done today and reviewed by myself Battery and lead measurements are good 2 AMS are very brief Atach (8 and 6 seconds) Capconfirm test run and recommended both A and V    Risk Assessment/Calculations:    Physical Exam:   VS:  There were no vitals taken for this visit.   Wt Readings from Last 3 Encounters:  12/08/23 181 lb 6.4 oz (82.3 kg)  12/04/23 179 lb 4.8 oz (81.3 kg)  11/11/23 181 lb (82.1 kg)    GEN: Well nourished, well developed, though thin, in no acute distress NECK:  No JVD; No carotid bruits CARDIAC: RRR, no murmurs, rubs, gallops RESPIRATORY:  CTA b/l without rales, wheezing or rhonchi  ABDOMEN: Soft, non-tender, non-distended EXTREMITIES: No edema; No deformity   ICD site: is stable, no thinning, fluctuation, tethering  ASSESSMENT AND PLAN: .    CRT-D intact function no programming changes made  CAD ICM Chronic CHF Recovered LVEF by echo 2024 No symptoms or exam findings of volume OL  CorVue looks stable/above threshold C/w Dr. Beverlee     Dispo: remotes as usual, back in clinic in a year again, sooner if needed We discussed Dr. Celine retirement > plan to transition to Dr. Inocencio (currently reading his remotes)  Signed, Luke Mcgilvray Macario Arthur, PA-C

## 2024-01-02 NOTE — Patient Instructions (Signed)
 Medication Instructions:   Your physician recommends that you continue on your current medications as directed. Please refer to the Current Medication list given to you today.  *If you need a refill on your cardiac medications before your next appointment, please call your pharmacy*  Lab Work: NONE ORDERED  TODAY   If you have labs (blood work) drawn today and your tests are completely normal, you will receive your results only by: MyChart Message (if you have MyChart) OR A paper copy in the mail If you have any lab test that is abnormal or we need to change your treatment, we will call you to review the results.  Testing/Procedures: NONE ORDERED  TODAY    Follow-Up: At [facility] you and your health needs are our priority.  As part of our continuing mission to provide you with exceptional heart care, our providers are all part of one team.  This team includes your primary Cardiologist (physician) and Advanced Practice Providers or APPs (Physician Assistants and Nurse Practitioners) who all work together to provide you with the care you need, when you need it.  Your next appointment:   1 year(s)    Provider:   Agatha Horsfall, MD    We recommend signing up for the patient portal called "MyChart".  Sign up information is provided on this After Visit Summary.  MyChart is used to connect with patients for Virtual Visits (Telemedicine).  Patients are able to view lab/test results, encounter notes, upcoming appointments, etc.  Non-urgent messages can be sent to your provider as well.   To learn more about what you can do with MyChart, go to [email or web address].   Other Instructions

## 2024-01-08 ENCOUNTER — Ambulatory Visit: Payer: Self-pay | Admitting: Cardiology

## 2024-01-20 NOTE — Telephone Encounter (Signed)
 Yes he can do 3 weeks or even escalate onc a month to keep it soimple on his calendar

## 2024-01-21 ENCOUNTER — Ambulatory Visit (INDEPENDENT_AMBULATORY_CARE_PROVIDER_SITE_OTHER)

## 2024-01-21 DIAGNOSIS — I447 Left bundle-branch block, unspecified: Secondary | ICD-10-CM

## 2024-01-21 LAB — CUP PACEART REMOTE DEVICE CHECK
Battery Remaining Longevity: 54 mo
Battery Remaining Percentage: 61 %
Battery Voltage: 2.95 V
Brady Statistic AP VP Percent: 11 %
Brady Statistic AP VS Percent: 1 %
Brady Statistic AS VP Percent: 89 %
Brady Statistic AS VS Percent: 1 %
Brady Statistic RA Percent Paced: 10 %
Date Time Interrogation Session: 20250917020458
HighPow Impedance: 80 Ohm
Implantable Lead Connection Status: 753985
Implantable Lead Connection Status: 753985
Implantable Lead Connection Status: 753985
Implantable Lead Implant Date: 20140313
Implantable Lead Implant Date: 20140313
Implantable Lead Implant Date: 20140313
Implantable Lead Location: 753858
Implantable Lead Location: 753859
Implantable Lead Location: 753860
Implantable Pulse Generator Implant Date: 20220622
Lead Channel Impedance Value: 360 Ohm
Lead Channel Impedance Value: 380 Ohm
Lead Channel Impedance Value: 810 Ohm
Lead Channel Pacing Threshold Amplitude: 0.75 V
Lead Channel Pacing Threshold Amplitude: 1 V
Lead Channel Pacing Threshold Amplitude: 1.75 V
Lead Channel Pacing Threshold Pulse Width: 0.5 ms
Lead Channel Pacing Threshold Pulse Width: 0.5 ms
Lead Channel Pacing Threshold Pulse Width: 0.6 ms
Lead Channel Sensing Intrinsic Amplitude: 3 mV
Lead Channel Sensing Intrinsic Amplitude: 7.5 mV
Lead Channel Setting Pacing Amplitude: 2 V
Lead Channel Setting Pacing Amplitude: 2 V
Lead Channel Setting Pacing Amplitude: 2.25 V
Lead Channel Setting Pacing Pulse Width: 0.5 ms
Lead Channel Setting Pacing Pulse Width: 0.6 ms
Lead Channel Setting Sensing Sensitivity: 0.5 mV
Pulse Gen Serial Number: 810030647

## 2024-01-24 ENCOUNTER — Ambulatory Visit: Payer: Self-pay | Admitting: Cardiology

## 2024-01-26 NOTE — Progress Notes (Signed)
Remote ICD Transmission.

## 2024-02-02 ENCOUNTER — Ambulatory Visit: Attending: Cardiology

## 2024-02-02 ENCOUNTER — Ambulatory Visit: Payer: Self-pay | Admitting: Internal Medicine

## 2024-02-02 DIAGNOSIS — Z95810 Presence of automatic (implantable) cardiac defibrillator: Secondary | ICD-10-CM | POA: Diagnosis not present

## 2024-02-02 DIAGNOSIS — I5042 Chronic combined systolic (congestive) and diastolic (congestive) heart failure: Secondary | ICD-10-CM | POA: Diagnosis not present

## 2024-02-02 NOTE — Telephone Encounter (Signed)
 Increased to 64 on Tyvaso-patient reports increase shortness of breath where he is having to use oxygen  more often, diarrhea and weakness in his legs. Patient is asking to remain on the 83 until he sees Ramaswamy on 10/6. Asking for provider recommendations.   FYI Only or Action Required?: Action required by provider: update on patient condition.  Patient is followed in Pulmonology for interstitial lung disease , last seen on 11/11/2023 by Geronimo Amel, MD.  Called Nurse Triage reporting Shortness of Breath.  Symptoms began several days ago.  Interventions attempted: Home oxygen  use and Other: Tyvaso.  Symptoms are: unchanged.  Triage Disposition: No disposition on file.  Patient/caregiver understands and will follow disposition?:   Copied from CRM [id number]. Topic: Clinical - Red Word Triage >> Feb 02, 2024  3:01 PM Devaughn RAMAN wrote: Red Word that prompted transfer to Nurse Triage: Patient called and stated he is on TYVASO DPI TITRATION KIT 16 & 32 & 48 MCG POWD patient stated he upgraded to 64, and he is having some side effects such as shortness of breath, oxygen  is low, but if he does not wear oxygen  he drops down quick, has diarrhea, and weakness in his legs. Reason for Disposition  [1] MILD difficulty breathing (e.g., minimal/no SOB at rest, SOB with walking, pulse < 100) AND [2] NEW-onset or WORSE than normal  Answer Assessment - Initial Assessment Questions Patient reports he increased to Tyvasco to 64 dose this past Thursday. States he has been having shortness of breath, oxygen  levels are dropping to lower to mid 80s without oxygen  on with activity. Patient reports his oxygen  levels will recover once he rests. Patient reports he can move around with his oxygen  on and lowest his pulse oximeter will read is 92-93%. Patient also endorses explosive diarrhea and weakness in his legs. States symptoms gradually started about 24 hours after starting the 64 dose treatment. Patient reports he  is having the wear oxygen  more frequently whereas before he just needed it at night. Patient is scheduled to see Ramaswamy on 02/09/2024. Patient is asking to continue to stay on the 64 until his appointment on 10/6. Patient is asking for recommendations of what he should do-patient educated on when to call back to triage and ED precautions.   1. RESPIRATORY STATUS: Describe your breathing? (e.g., wheezing, shortness of breath, unable to speak, severe coughing)      Shortness of breath 2. ONSET: When did this breathing problem begin?      Started within 24 hours of starting 64.  3. PATTERN Does the difficult breathing come and go, or has it been constant since it started?      Patient reports if he doesn't have the oxygen  on and is moving around-can get winded quickly and short of breath.  4. SEVERITY: How bad is your breathing? (e.g., mild, moderate, severe)      Reports getting very winded 5. RECURRENT SYMPTOM: Have you had difficulty breathing before? If Yes, ask: When was the last time? and What happened that time?      Reports shortness of breath for a period of time with the 48 dose but patient states not like he is with the 64 dose 6. CARDIAC HISTORY: Do you have any history of heart disease? (e.g., heart attack, angina, bypass surgery, angioplasty)      yes 7. LUNG HISTORY: Do you have any history of lung disease?  (e.g., pulmonary embolus, asthma, emphysema)     yes 8. CAUSE: What do you think  is causing the breathing problem?      Side effect to Tyvaso 9. OTHER SYMPTOMS: Do you have any other symptoms? (e.g., chest pain, cough, dizziness, fever, runny nose)     Diarrhea, leg weakness 10. O2 SATURATION MONITOR:  Do you use an oxygen  saturation monitor (pulse oximeter) at home? If Yes, ask: What is your reading (oxygen  level) today? What is your usual oxygen  saturation reading? (e.g., 95%)       95% on 2 L [place] 12. TRAVEL: Have you traveled out of the country  in the last month? (e.g., travel history, exposures)       no  Protocols used: Breathing Difficulty-A-AH

## 2024-02-03 ENCOUNTER — Telehealth: Payer: Self-pay

## 2024-02-03 NOTE — Progress Notes (Signed)
 EPIC Encounter for ICM Monitoring  Patient Name: Luke [name] is a 78 y.o. male Date: 02/03/2024 Primary Care Physican: Cleatus Arlyss RAMAN, MD Primary Cardiologist: Skains/Sabharwal (as needed) Electrophysiologist: Camnitz Bi-V Pacing: >99%          03/24/2023 Weight: 173.6 lbs 04/15/2023 Weight: 174.2 lbs 05/01/2023 Weight: 170 lbs      05/21/2023: Weight: 170 lbs    06/17/2023 Weight: 179 lbs         06/20/2023 Weight: 175.5 lbs      07/30/2023 Weight: 175-178 lbs 10/06/2023 Office Weight: 181 lbs                                    Attempted call to patient and unable to reach.  Left detailed message per DPR regarding transmission.  Transmission results reviewed.    Since 12/29/2023 ICM Remote Transmission: CorVue thoracic impedance suggesting possible fluid accumulation starting 01/30/2024 but trending back toward baseline.  Also suggesting possible fluid accumulation from 01/25/2024-01/28/2024.   Prescribed:  Torsemide  20 mg take 1 tablet(s) (20 mg total) by mouth daily.  06/17/23 He reports Dr Gardenia discussed with him to take extra Torsemide  x 2 days only if needed for fluid symptoms.   Labs: 10/06/2023 Creatinine 1.47, BUN 35, Potassium 4.7, Sodium 134, GFR 45.89 A complete set of results can be found in Results Review.   Recommendations:  Left voice mail with ICM number and encouraged to call if experiencing any fluid symptoms.   Follow-up plan: ICM clinic phone appointment on 03/08/2024.   91 day device clinic remote transmission 01/21/2024.     EP/Cardiology Office Visits: Recall 12/27/2024 with Dr Inocencio.   Recall 06/05/2024 with Dr Jeffrie.     Copy of ICM check sent to Dr. Inocencio.   Remote monitoring is medically necessary for Heart Failure Management.    90 day Daily Thoracic Impedance ICM trend: 11/03/2023 through 02/02/2024.    12-14 Month Thoracic Impedance ICM trend:     Mitzie RAMAN Garner, RN 02/03/2024 10:37 AM

## 2024-02-03 NOTE — Telephone Encounter (Signed)
 Remote ICM transmission received.  Attempted call to patient regarding ICM remote transmission and no answer.

## 2024-02-04 ENCOUNTER — Ambulatory Visit: Admitting: Podiatry

## 2024-02-04 ENCOUNTER — Ambulatory Visit (INDEPENDENT_AMBULATORY_CARE_PROVIDER_SITE_OTHER)

## 2024-02-04 ENCOUNTER — Encounter: Payer: Self-pay | Admitting: Podiatry

## 2024-02-04 ENCOUNTER — Ambulatory Visit (INDEPENDENT_AMBULATORY_CARE_PROVIDER_SITE_OTHER): Admitting: Podiatry

## 2024-02-04 VITALS — BP 138/82 | HR 89 | Temp 98.0°F | Resp 16 | Ht 73.0 in | Wt 181.0 lb

## 2024-02-04 DIAGNOSIS — M86271 Subacute osteomyelitis, right ankle and foot: Secondary | ICD-10-CM

## 2024-02-04 DIAGNOSIS — L03115 Cellulitis of right lower limb: Secondary | ICD-10-CM

## 2024-02-04 DIAGNOSIS — I739 Peripheral vascular disease, unspecified: Secondary | ICD-10-CM

## 2024-02-04 MED ORDER — DOXYCYCLINE HYCLATE 100 MG PO TABS: 100.0000 mg | ORAL_TABLET | Freq: Two times a day (BID) | ORAL | 0 refills | Status: DC

## 2024-02-04 MED ORDER — CIPROFLOXACIN HCL 500 MG PO TABS: 500.0000 mg | ORAL_TABLET | Freq: Two times a day (BID) | ORAL | 0 refills | Status: DC

## 2024-02-04 NOTE — Progress Notes (Signed)
 Luke [name]

## 2024-02-05 ENCOUNTER — Ambulatory Visit
Admission: RE | Admit: 2024-02-05 | Discharge: 2024-02-05 | Disposition: A | Discharge status: Home / Self Care | Source: Ambulatory Visit | Attending: Podiatry | Admitting: Podiatry

## 2024-02-05 DIAGNOSIS — M86271 Subacute osteomyelitis, right ankle and foot: Secondary | ICD-10-CM

## 2024-02-05 DIAGNOSIS — S92341A Displaced fracture of fourth metatarsal bone, right foot, initial encounter for closed fracture: Secondary | ICD-10-CM | POA: Diagnosis not present

## 2024-02-05 NOTE — Telephone Encounter (Signed)
 I called and spoke with the pt and notified of response from Dr. Kara. Pt verbalized understanding. He has ov with MR for 02/09/24 and will discuss further at that time. Nothing further needed.

## 2024-02-05 NOTE — Telephone Encounter (Signed)
 Recommend patient take imodium as indicated on the bottle/package instructions for the diarrhea.  He should be monitoring his blood pressure at home. His leg weakness could be due to low blood pressure. If the blood pressure is less than 90/60 recommend stopping the Tyvaso or going to a reduced dose.  Thanks, JD

## 2024-02-06 ENCOUNTER — Inpatient Hospital Stay (HOSPITAL_COMMUNITY)

## 2024-02-06 ENCOUNTER — Ambulatory Visit: Payer: Self-pay | Admitting: Podiatry

## 2024-02-06 ENCOUNTER — Other Ambulatory Visit: Payer: Self-pay

## 2024-02-06 ENCOUNTER — Emergency Department (HOSPITAL_COMMUNITY)

## 2024-02-06 ENCOUNTER — Inpatient Hospital Stay (HOSPITAL_COMMUNITY)
Admission: EM | Admit: 2024-02-06 | Discharge: 2024-02-10 | DRG: 638 | Disposition: A | Discharge status: Home / Self Care | Attending: Internal Medicine | Admitting: Internal Medicine

## 2024-02-06 DIAGNOSIS — R609 Edema, unspecified: Secondary | ICD-10-CM | POA: Diagnosis not present

## 2024-02-06 DIAGNOSIS — Z794 Long term (current) use of insulin: Secondary | ICD-10-CM | POA: Diagnosis not present

## 2024-02-06 DIAGNOSIS — Z79899 Other long term (current) drug therapy: Secondary | ICD-10-CM

## 2024-02-06 DIAGNOSIS — M86071 Acute hematogenous osteomyelitis, right ankle and foot: Secondary | ICD-10-CM

## 2024-02-06 DIAGNOSIS — J84112 Idiopathic pulmonary fibrosis: Secondary | ICD-10-CM | POA: Diagnosis present

## 2024-02-06 DIAGNOSIS — I272 Pulmonary hypertension, unspecified: Secondary | ICD-10-CM | POA: Diagnosis present

## 2024-02-06 DIAGNOSIS — K219 Gastro-esophageal reflux disease without esophagitis: Secondary | ICD-10-CM | POA: Diagnosis present

## 2024-02-06 DIAGNOSIS — R918 Other nonspecific abnormal finding of lung field: Secondary | ICD-10-CM | POA: Diagnosis not present

## 2024-02-06 DIAGNOSIS — E1142 Type 2 diabetes mellitus with diabetic polyneuropathy: Secondary | ICD-10-CM | POA: Diagnosis present

## 2024-02-06 DIAGNOSIS — L97519 Non-pressure chronic ulcer of other part of right foot with unspecified severity: Secondary | ICD-10-CM | POA: Diagnosis not present

## 2024-02-06 DIAGNOSIS — Z8679 Personal history of other diseases of the circulatory system: Secondary | ICD-10-CM

## 2024-02-06 DIAGNOSIS — N183 Chronic kidney disease, stage 3 unspecified: Secondary | ICD-10-CM | POA: Diagnosis present

## 2024-02-06 DIAGNOSIS — I2510 Atherosclerotic heart disease of native coronary artery without angina pectoris: Secondary | ICD-10-CM | POA: Diagnosis present

## 2024-02-06 DIAGNOSIS — M869 Osteomyelitis, unspecified: Secondary | ICD-10-CM | POA: Diagnosis not present

## 2024-02-06 DIAGNOSIS — Z888 Allergy status to other drugs, medicaments and biological substances status: Secondary | ICD-10-CM

## 2024-02-06 DIAGNOSIS — I5032 Chronic diastolic (congestive) heart failure: Secondary | ICD-10-CM | POA: Diagnosis present

## 2024-02-06 DIAGNOSIS — I7092 Chronic total occlusion of artery of the extremities: Secondary | ICD-10-CM | POA: Diagnosis not present

## 2024-02-06 DIAGNOSIS — I361 Nonrheumatic tricuspid (valve) insufficiency: Secondary | ICD-10-CM | POA: Diagnosis not present

## 2024-02-06 DIAGNOSIS — Z823 Family history of stroke: Secondary | ICD-10-CM

## 2024-02-06 DIAGNOSIS — N179 Acute kidney failure, unspecified: Secondary | ICD-10-CM | POA: Diagnosis present

## 2024-02-06 DIAGNOSIS — R0902 Hypoxemia: Secondary | ICD-10-CM | POA: Diagnosis present

## 2024-02-06 DIAGNOSIS — I482 Chronic atrial fibrillation, unspecified: Secondary | ICD-10-CM | POA: Diagnosis present

## 2024-02-06 DIAGNOSIS — M86171 Other acute osteomyelitis, right ankle and foot: Secondary | ICD-10-CM | POA: Diagnosis not present

## 2024-02-06 DIAGNOSIS — I130 Hypertensive heart and chronic kidney disease with heart failure and stage 1 through stage 4 chronic kidney disease, or unspecified chronic kidney disease: Secondary | ICD-10-CM | POA: Diagnosis present

## 2024-02-06 DIAGNOSIS — Z87891 Personal history of nicotine dependence: Secondary | ICD-10-CM

## 2024-02-06 DIAGNOSIS — Z95828 Presence of other vascular implants and grafts: Secondary | ICD-10-CM | POA: Diagnosis not present

## 2024-02-06 DIAGNOSIS — E1122 Type 2 diabetes mellitus with diabetic chronic kidney disease: Secondary | ICD-10-CM | POA: Diagnosis present

## 2024-02-06 DIAGNOSIS — E1151 Type 2 diabetes mellitus with diabetic peripheral angiopathy without gangrene: Secondary | ICD-10-CM | POA: Diagnosis present

## 2024-02-06 DIAGNOSIS — I10 Essential (primary) hypertension: Secondary | ICD-10-CM | POA: Diagnosis not present

## 2024-02-06 DIAGNOSIS — Z8616 Personal history of COVID-19: Secondary | ICD-10-CM

## 2024-02-06 DIAGNOSIS — Z8249 Family history of ischemic heart disease and other diseases of the circulatory system: Secondary | ICD-10-CM

## 2024-02-06 DIAGNOSIS — Z538 Procedure and treatment not carried out for other reasons: Secondary | ICD-10-CM | POA: Diagnosis not present

## 2024-02-06 DIAGNOSIS — E11621 Type 2 diabetes mellitus with foot ulcer: Secondary | ICD-10-CM | POA: Diagnosis present

## 2024-02-06 DIAGNOSIS — Z9889 Other specified postprocedural states: Secondary | ICD-10-CM | POA: Diagnosis not present

## 2024-02-06 DIAGNOSIS — Z95810 Presence of automatic (implantable) cardiac defibrillator: Secondary | ICD-10-CM

## 2024-02-06 DIAGNOSIS — I70221 Atherosclerosis of native arteries of extremities with rest pain, right leg: Secondary | ICD-10-CM | POA: Diagnosis present

## 2024-02-06 DIAGNOSIS — Z981 Arthrodesis status: Secondary | ICD-10-CM

## 2024-02-06 DIAGNOSIS — Z885 Allergy status to narcotic agent status: Secondary | ICD-10-CM

## 2024-02-06 DIAGNOSIS — I739 Peripheral vascular disease, unspecified: Secondary | ICD-10-CM | POA: Diagnosis not present

## 2024-02-06 DIAGNOSIS — Z801 Family history of malignant neoplasm of trachea, bronchus and lung: Secondary | ICD-10-CM

## 2024-02-06 DIAGNOSIS — S91109A Unspecified open wound of unspecified toe(s) without damage to nail, initial encounter: Secondary | ICD-10-CM | POA: Diagnosis not present

## 2024-02-06 DIAGNOSIS — M84474D Pathological fracture, right foot, subsequent encounter for fracture with routine healing: Secondary | ICD-10-CM | POA: Diagnosis present

## 2024-02-06 DIAGNOSIS — L03115 Cellulitis of right lower limb: Secondary | ICD-10-CM | POA: Diagnosis present

## 2024-02-06 DIAGNOSIS — M86371 Chronic multifocal osteomyelitis, right ankle and foot: Secondary | ICD-10-CM | POA: Diagnosis not present

## 2024-02-06 DIAGNOSIS — I70291 Other atherosclerosis of native arteries of extremities, right leg: Secondary | ICD-10-CM | POA: Diagnosis not present

## 2024-02-06 DIAGNOSIS — Z950 Presence of cardiac pacemaker: Secondary | ICD-10-CM | POA: Diagnosis not present

## 2024-02-06 DIAGNOSIS — Z7982 Long term (current) use of aspirin: Secondary | ICD-10-CM

## 2024-02-06 DIAGNOSIS — E785 Hyperlipidemia, unspecified: Secondary | ICD-10-CM | POA: Diagnosis present

## 2024-02-06 DIAGNOSIS — Z8619 Personal history of other infectious and parasitic diseases: Secondary | ICD-10-CM | POA: Diagnosis not present

## 2024-02-06 DIAGNOSIS — Z7902 Long term (current) use of antithrombotics/antiplatelets: Secondary | ICD-10-CM

## 2024-02-06 DIAGNOSIS — Z9981 Dependence on supplemental oxygen: Secondary | ICD-10-CM

## 2024-02-06 DIAGNOSIS — Z83438 Family history of other disorder of lipoprotein metabolism and other lipidemia: Secondary | ICD-10-CM

## 2024-02-06 DIAGNOSIS — Z880 Allergy status to penicillin: Secondary | ICD-10-CM

## 2024-02-06 DIAGNOSIS — L089 Local infection of the skin and subcutaneous tissue, unspecified: Secondary | ICD-10-CM | POA: Diagnosis not present

## 2024-02-06 DIAGNOSIS — E872 Acidosis, unspecified: Secondary | ICD-10-CM | POA: Diagnosis present

## 2024-02-06 DIAGNOSIS — I7090 Unspecified atherosclerosis: Secondary | ICD-10-CM | POA: Diagnosis not present

## 2024-02-06 DIAGNOSIS — Z833 Family history of diabetes mellitus: Secondary | ICD-10-CM

## 2024-02-06 DIAGNOSIS — E1169 Type 2 diabetes mellitus with other specified complication: Principal | ICD-10-CM | POA: Diagnosis present

## 2024-02-06 DIAGNOSIS — I255 Ischemic cardiomyopathy: Secondary | ICD-10-CM | POA: Diagnosis present

## 2024-02-06 DIAGNOSIS — S91301A Unspecified open wound, right foot, initial encounter: Secondary | ICD-10-CM | POA: Diagnosis not present

## 2024-02-06 DIAGNOSIS — K4090 Unilateral inguinal hernia, without obstruction or gangrene, not specified as recurrent: Secondary | ICD-10-CM | POA: Diagnosis not present

## 2024-02-06 DIAGNOSIS — Z7984 Long term (current) use of oral hypoglycemic drugs: Secondary | ICD-10-CM

## 2024-02-06 DIAGNOSIS — I252 Old myocardial infarction: Secondary | ICD-10-CM

## 2024-02-06 LAB — COMPREHENSIVE METABOLIC PANEL WITH GFR
ALT: 12 U/L (ref 0–44)
AST: 17 U/L (ref 15–41)
Albumin: 3.1 g/dL — ABNORMAL LOW (ref 3.5–5.0)
Alkaline Phosphatase: 56 U/L (ref 38–126)
Anion gap: 16 — ABNORMAL HIGH (ref 5–15)
BUN: 24 mg/dL — ABNORMAL HIGH (ref 8–23)
CO2: 20 mmol/L — ABNORMAL LOW (ref 22–32)
Calcium: 9.2 mg/dL (ref 8.9–10.3)
Chloride: 101 mmol/L (ref 98–111)
Creatinine, Ser: 1.49 mg/dL — ABNORMAL HIGH (ref 0.61–1.24)
GFR, Estimated: 48 mL/min — ABNORMAL LOW (ref >60)
Glucose, Bld: 253 mg/dL — ABNORMAL HIGH (ref 70–99)
Potassium: 4.3 mmol/L (ref 3.5–5.1)
Sodium: 137 mmol/L (ref 135–145)
Total Bilirubin: 1.1 mg/dL (ref 0.0–1.2)
Total Protein: 7.5 g/dL (ref 6.5–8.1)

## 2024-02-06 LAB — CBC WITH DIFFERENTIAL/PLATELET
Abs Immature Granulocytes: 0.15 K/uL — ABNORMAL HIGH (ref 0.00–0.07)
Basophils Absolute: 0.1 K/uL (ref 0.0–0.1)
Basophils Relative: 1 %
Eosinophils Absolute: 0.1 K/uL (ref 0.0–0.5)
Eosinophils Relative: 1 %
HCT: 36.4 % — ABNORMAL LOW (ref 39.0–52.0)
Hemoglobin: 11.8 g/dL — ABNORMAL LOW (ref 13.0–17.0)
Immature Granulocytes: 1 %
Lymphocytes Relative: 6 %
Lymphs Abs: 0.6 K/uL — ABNORMAL LOW (ref 0.7–4.0)
MCH: 33.3 pg (ref 26.0–34.0)
MCHC: 32.4 g/dL (ref 30.0–36.0)
MCV: 102.8 fL — ABNORMAL HIGH (ref 80.0–100.0)
Monocytes Absolute: 0.5 K/uL (ref 0.1–1.0)
Monocytes Relative: 5 %
Neutro Abs: 9.3 K/uL — ABNORMAL HIGH (ref 1.7–7.7)
Neutrophils Relative %: 86 %
Platelets: 355 K/uL (ref 150–400)
RBC: 3.54 MIL/uL — ABNORMAL LOW (ref 4.22–5.81)
RDW: 12.3 % (ref 11.5–15.5)
WBC: 10.8 K/uL — ABNORMAL HIGH (ref 4.0–10.5)
nRBC: 0 % (ref 0.0–0.2)

## 2024-02-06 LAB — LACTIC ACID, PLASMA
Lactic Acid, Venous: 1.5 mmol/L (ref 0.5–1.9)
Lactic Acid, Venous: 1.2 mmol/L (ref 0.5–1.9)

## 2024-02-06 LAB — I-STAT CG4 LACTIC ACID, ED: Lactic Acid, Venous: 3.2 mmol/L (ref 0.5–1.9)

## 2024-02-06 LAB — GLUCOSE, CAPILLARY
Glucose-Capillary: 211 mg/dL — ABNORMAL HIGH (ref 70–99)
Glucose-Capillary: 219 mg/dL — ABNORMAL HIGH (ref 70–99)

## 2024-02-06 LAB — SEDIMENTATION RATE: Sed Rate: 73 mm/h — ABNORMAL HIGH (ref 0–16)

## 2024-02-06 LAB — C-REACTIVE PROTEIN: CRP: 5 mg/dL — ABNORMAL HIGH (ref <1.0)

## 2024-02-06 LAB — HEMOGLOBIN A1C
Hgb A1c MFr Bld: 7.8 % — ABNORMAL HIGH (ref 4.8–5.6)
Mean Plasma Glucose: 177.16 mg/dL

## 2024-02-06 MED ORDER — VANCOMYCIN HCL IN DEXTROSE 1-5 GM/200ML-% IV SOLN
1000.0000 mg | Freq: Once | INTRAVENOUS | Status: DC
Start: 2024-02-06 — End: 2024-02-06

## 2024-02-06 MED ORDER — VANCOMYCIN HCL 1250 MG/250ML IV SOLN
1250.0000 mg | INTRAVENOUS | Status: DC
  Administered 2024-02-07: 1250 mg via INTRAVENOUS
  Filled 2024-02-06 (×2): qty 250

## 2024-02-06 MED ORDER — POLYETHYLENE GLYCOL 3350 17 G PO PACK: 17.0000 g | PACK | Freq: Every day | ORAL | Status: DC | PRN

## 2024-02-06 MED ORDER — IOHEXOL 350 MG/ML SOLN
100.0000 mL | Freq: Once | INTRAVENOUS | Status: AC | PRN
  Administered 2024-02-06: 100 mL via INTRAVENOUS

## 2024-02-06 MED ORDER — VANCOMYCIN HCL 1500 MG/300ML IV SOLN
1500.0000 mg | INTRAVENOUS | Status: AC
  Administered 2024-02-06: 1500 mg via INTRAVENOUS
  Filled 2024-02-06: qty 300

## 2024-02-06 MED ORDER — METRONIDAZOLE 500 MG/100ML IV SOLN: 500.0000 mg | Freq: Once | INTRAVENOUS | Status: DC

## 2024-02-06 MED ORDER — SODIUM CHLORIDE 0.9 % IV SOLN: INTRAVENOUS | Status: DC

## 2024-02-06 MED ORDER — SODIUM CHLORIDE 0.9 % IV SOLN
2.0000 g | Freq: Two times a day (BID) | INTRAVENOUS | Status: DC
  Administered 2024-02-06 – 2024-02-08 (×4): 2 g via INTRAVENOUS
  Filled 2024-02-06 (×4): qty 12.5

## 2024-02-06 MED ORDER — INSULIN ASPART 100 UNIT/ML IJ SOLN: 0.0000 [IU] | Freq: Three times a day (TID) | INTRAMUSCULAR | Status: DC

## 2024-02-06 MED ORDER — LACTATED RINGERS IV SOLN
150.0000 mL/h | INTRAVENOUS | Status: AC
Start: 2024-02-06 — End: 2024-02-07
  Administered 2024-02-06: 150 mL/h via INTRAVENOUS

## 2024-02-06 MED ORDER — HEPARIN SODIUM (PORCINE) 5000 UNIT/ML IJ SOLN
5000.0000 [IU] | Freq: Three times a day (TID) | INTRAMUSCULAR | Status: DC
  Administered 2024-02-06: 5000 [IU] via SUBCUTANEOUS
  Filled 2024-02-06: qty 1

## 2024-02-06 MED ORDER — INSULIN ASPART 100 UNIT/ML IJ SOLN
0.0000 [IU] | INTRAMUSCULAR | Status: DC
  Administered 2024-02-07: 2 [IU] via SUBCUTANEOUS
  Administered 2024-02-07: 3 [IU] via SUBCUTANEOUS
  Administered 2024-02-07: 5 [IU] via SUBCUTANEOUS
  Administered 2024-02-07: 3 [IU] via SUBCUTANEOUS

## 2024-02-06 MED ORDER — METRONIDAZOLE 500 MG/100ML IV SOLN
500.0000 mg | Freq: Once | INTRAVENOUS | Status: AC
  Administered 2024-02-06: 500 mg via INTRAVENOUS
  Filled 2024-02-06: qty 100

## 2024-02-06 MED ORDER — ACETAMINOPHEN 325 MG PO TABS: 650.0000 mg | ORAL_TABLET | Freq: Four times a day (QID) | ORAL | Status: DC | PRN

## 2024-02-06 MED ORDER — BUDESONIDE 0.5 MG/2ML IN SUSP
0.5000 mg | Freq: Two times a day (BID) | RESPIRATORY_TRACT | Status: DC
Start: 2024-02-06 — End: 2024-02-10
  Administered 2024-02-06 – 2024-02-10 (×7): 0.5 mg via RESPIRATORY_TRACT
  Filled 2024-02-06 (×6): qty 2

## 2024-02-06 MED ORDER — SODIUM CHLORIDE 0.9 % IV SOLN
2.0000 g | Freq: Once | INTRAVENOUS | Status: AC
  Administered 2024-02-06: 2 g via INTRAVENOUS
  Filled 2024-02-06: qty 20

## 2024-02-06 MED ORDER — TREPROSTINIL 64 MCG IN POWD
64.0000 ug | Freq: Four times a day (QID) | RESPIRATORY_TRACT | Status: DC
  Administered 2024-02-06 – 2024-02-08 (×9): 64 ug via RESPIRATORY_TRACT
  Filled 2024-02-06 (×4): qty 1

## 2024-02-06 MED ORDER — SODIUM CHLORIDE 0.9 % IV SOLN: 2.0000 g | Freq: Once | INTRAVENOUS | Status: DC

## 2024-02-06 MED ORDER — ACETAMINOPHEN 650 MG RE SUPP: 650.0000 mg | Freq: Four times a day (QID) | RECTAL | Status: DC | PRN

## 2024-02-06 MED ORDER — BISACODYL 5 MG PO TBEC
5.0000 mg | DELAYED_RELEASE_TABLET | Freq: Every day | ORAL | Status: DC | PRN
  Administered 2024-02-10: 5 mg via ORAL
  Filled 2024-02-06: qty 1

## 2024-02-06 MED ORDER — METRONIDAZOLE 500 MG/100ML IV SOLN
500.0000 mg | Freq: Two times a day (BID) | INTRAVENOUS | Status: DC
  Administered 2024-02-06 – 2024-02-10 (×7): 500 mg via INTRAVENOUS
  Filled 2024-02-06 (×8): qty 100

## 2024-02-06 NOTE — Progress Notes (Signed)
 RLE venous duplex has been completed.   Results can be found under chart review under CV PROC. 02/06/2024 4:46 PM Hosam Mcfetridge RVT, RDMS

## 2024-02-06 NOTE — Progress Notes (Signed)
 Pharmacy Antibiotic Note  Luke [name] is a 78 y.o. male admitted on 02/06/2024 with Osteomyelitis.  Pharmacy has been consulted for Vancomycin and Cefepime dosing.  Plan: Vancomycin 1500 mg IV X 1 dose then 1250 mg IV every 24 hours.  Goal trough 10-15 mcg/mL. Cefepime 2G Q12H  Height: 6' 1 (185.4 cm) IBW/kg (Calculated) : 79.9  Temp (24hrs), Avg:97.8 F (36.6 C), Min:97.6 F (36.4 C), Max:98.2 F (36.8 C)  Recent Labs  Lab 02/06/24 1121 02/06/24 1131  WBC 10.8*  --   CREATININE 1.49*  --   LATICACIDVEN  --  3.2*    Estimated Creatinine Clearance: 46.9 mL/min (A) (by C-G formula based on SCr of 1.49 mg/dL (H)).    Allergies  Allergen Reactions   Aldactone  [Spironolactone ] Other (See Comments)    Hyperkalemia   Brilinta  [Ticagrelor ] Shortness Of Breath    Numbness in hands and feet Blurry vision   Clopidogrel  Rash    Redness and Itchiness   Codeine Rash and Hives   Amoxicillin  Hives   Atorvastatin  Other (See Comments)    Myalgias with lipitor.  Does tolerate simvastatin .     Benadryl  [Diphenhydramine ] Hives   Januvia  [Sitagliptin ] Other (See Comments)    Diarrhea and heart racing   Jardiance  [Empagliflozin ] Other (See Comments)    Polyuria; excessive weight loss   Lisinopril  Other (See Comments)    Possible cause of pancreatitis   Rosuvastatin  Other (See Comments)    myalgia   Lasix  [Furosemide ] Rash    Antimicrobials this admission: Ceftriaxone 10/3 x 1 dose Vancomycin 10/3 >>  Cefepime 10/3>> Metronidazole 10/3>>  Dose adjustments this admission:   Microbiology results: 10/3 BCx: pending   Thank you for allowing pharmacy to be a part of this patient's care.  Luke [name], PharmD Clinical Pharmacist 02/06/2024  7:14 PM **Pharmacist phone directory can now be found on [email or web address] ([email or web address]).  Listed under [facility].

## 2024-02-06 NOTE — ED Provider Notes (Signed)
 [place] EMERGENCY DEPARTMENT AT [facility] Provider Note   CSN: [id number] Arrival date & time: 02/06/24  1032     Patient presents with: Fatigue   Bill L Lovern is a 78 y.o. male.   78 year old male with past medical history of diabetes, hypertension, hyperlipidemia, and pulmonary hypertension as well as osteomyelitis presenting to the emergency department today with a wound on his fourth toe on his right foot.  The patient has been on ciprofloxacin  and doxycycline  as an outpatient.  This continues to worsen.  The patient denies any systemic symptoms such as fevers.  He came to the emergency department today due to these ongoing symptoms.  He has seen podiatry as an outpatient who sentiment today.        Prior to Admission medications   Medication Sig Start Date End Date Taking? Authorizing Provider  aspirin  EC 81 MG tablet Take 1 tablet (81 mg total) by mouth daily. 09/18/22   Darryle Thom CROME, PA-C  carvedilol  (COREG ) 25 MG tablet TAKE 1 TABLET BY MOUTH TWICE DAILY WITH MEALS 02/28/23   Jeffrie Oneil BROCKS, MD  Cholecalciferol  (VITAMIN D3) 50 MCG (2000 UT) TABS Take 2,000 Units by mouth in the morning.    [provider]  ciprofloxacin  (CIPRO ) 500 MG tablet Take 1 tablet (500 mg total) by mouth 2 (two) times daily. 02/04/24   Gershon Donnice SAUNDERS, DPM  Cyanocobalamin  (VITAMIN B-12 PO) Take 2,000 mcg by mouth in the morning.    [provider]  doxycycline  (VIBRA -TABS) 100 MG tablet Take 1 tablet (100 mg total) by mouth 2 (two) times daily. 02/04/24   Gershon Donnice SAUNDERS, DPM  glipiZIDE  (GLUCOTROL ) 5 MG tablet Take 2 tablets (10 mg total) by mouth daily. With supper. 10/06/23   Cleatus Arlyss RAMAN, MD  glucose blood (ACCU-CHEK AVIVA PLUS) test strip USE AS DIRECTED TO TEST BLOOD SUGAR TWICE DAILY 04/15/23   Cleatus Arlyss RAMAN, MD  insulin  glargine (LANTUS  SOLOSTAR) 100 UNIT/ML Solostar Pen INJECT 0.3 TO 0.35 MLS(30 TO 35 UNITS) INTO THE SKIN EVERY DAY 03/29/20   Cleatus Arlyss RAMAN, MD  metFORMIN  (GLUCOPHAGE ) 500 MG tablet TAKE 2 TABLETS BY MOUTH TWICE DAILY WITH FOOD 04/29/23   Cleatus Arlyss RAMAN, MD  Multiple Vitamin (MULTIVITAMIN WITH MINERALS) TABS tablet Take 1 tablet by mouth in the morning.    [provider]  omeprazole  (PRILOSEC) 40 MG capsule TAKE 1 CAPSULE(40 MG) BY MOUTH DAILY 08/14/23   Nandigam, Kavitha V, MD  prasugrel  (EFFIENT ) 10 MG TABS tablet Take 0.5 tablets (5 mg total) by mouth in the morning and at bedtime. 10/06/23   Cleatus Arlyss RAMAN, MD  Sacubitril-Valsartan  (ENTRESTO  PO) Take 1 tablet by mouth 2 (two) times daily.    [provider]  silver  sulfADIAZINE  (SILVADENE ) 1 % cream Apply 1 Application topically daily. 10/14/23   Iva Marty Saltness, MD  simvastatin  (ZOCOR ) 20 MG tablet TAKE 1 TABLET(20 MG) BY MOUTH AT BEDTIME 10/13/23   Walker, Caitlin S, NP  torsemide  (DEMADEX ) 20 MG tablet TAKE 1 TABLET(20 MG) BY MOUTH DAILY 11/25/23   Jeffrie Oneil BROCKS, MD  triamcinolone  ointment (KENALOG ) 0.1 % Apply topically 2 (two) times daily. 12/04/23   Iva Marty Saltness, MD  TYVASO DPI TITRATION KIT 16 & 32 & 48 MCG POWD Inhale into the lungs. 12/02/23   [provider]  umeclidinium bromide  (INCRUSE ELLIPTA ) 62.5 MCG/ACT AEPB Inhale 1 puff into the lungs daily. 09/06/22   Geronimo Amel, MD  valsartan  (DIOVAN ) 40 MG  tablet Take 1 tablet (40 mg total) by mouth daily with supper. 10/08/23   Jeffrie Oneil BROCKS, MD    Allergies: Aldactone  Demetria.Dauer ], Brilinta  [ticagrelor ], Clopidogrel , Codeine, Amoxicillin , Atorvastatin , Benadryl  [diphenhydramine ], Januvia  [sitagliptin ], Jardiance  [empagliflozin ], Lisinopril , Rosuvastatin , and Lasix  [furosemide ]    Review of Systems  Skin:  Positive for wound.  All other systems reviewed and are negative.   Updated Vital Signs BP 132/77 (BP Location: Right Arm)   Pulse 82   Temp 98.2 F (36.8 C) (Oral)   Resp 18   SpO2 96%   Physical Exam Vitals and nursing note reviewed.   Gen: NAD Eyes:  PERRL, EOMI HEENT: no oropharyngeal swelling Neck: trachea midline Resp: clear to auscultation bilaterally Card: RRR, no murmurs, rubs, or gallops, normal capillary refill on the toes of the right foot Abd: nontender, nondistended Extremities: no calf tenderness, no edema Vascular: Trace DP and PT pulse on the right Skin: Small ulceration noted over the fourth toe on the right foot with minimal surrounding erythema Psyc: acting appropriately   (all labs ordered are listed, but only abnormal results are displayed) Labs Reviewed  COMPREHENSIVE METABOLIC PANEL WITH GFR - Abnormal; Notable for the following components:      Result Value   CO2 20 (*)    Glucose, Bld 253 (*)    BUN 24 (*)    Creatinine, Ser 1.49 (*)    Albumin  3.1 (*)    GFR, Estimated 48 (*)    Anion gap 16 (*)    All other components within normal limits  CBC WITH DIFFERENTIAL/PLATELET - Abnormal; Notable for the following components:   WBC 10.8 (*)    RBC 3.54 (*)    Hemoglobin 11.8 (*)    HCT 36.4 (*)    MCV 102.8 (*)    Neutro Abs 9.3 (*)    Lymphs Abs 0.6 (*)    Abs Immature Granulocytes 0.15 (*)    All other components within normal limits  I-STAT CG4 LACTIC ACID, ED - Abnormal; Notable for the following components:   Lactic Acid, Venous 3.2 (*)    All other components within normal limits  CULTURE, BLOOD (ROUTINE X 2)  CULTURE, BLOOD (ROUTINE X 2)    EKG: None  Radiology: CT FOOT RIGHT WO CONTRAST Result Date: 02/05/2024 EXAM: CT RIGHT FOOT AND ANKLE, WITHOUT IV CONTRAST 02/05/2024 04:15:00 PM TECHNIQUE: Axial images were acquired through the right foot and ankle without IV contrast. Reformatted images were reviewed. Automated exposure control, iterative reconstruction, and/or weight based adjustment of the mA/kV was utilized to reduce the radiation dose to as low as reasonably achievable. COMPARISON: None available. CLINICAL HISTORY: Right foot swelling, suspected osteomyelitis, history of diabetes  and PAD, draining wounds, subacute osteomyelitis on prior X-ray. FINDINGS: BONES AND JOINTS: Destructive findings in the head of the 1st metatarsal, base of the proximal phalanx great toe, phalanges of the third toe, phalanges of the fourth toe, and in the head of the fourth metatarsal compatible with active osteomyelitis. Oblique fracture through the distal metaphysis of the 4th metatarsal. Small well corticated ossicles below the medial malleolus. Large plantar and achilles calcaneal spurs. Posterior spur angle in the posterior subtalar joint. Mild dorsal midfoot spurring. Mild scallop angle in the superior margins of the 1st digit sesamoids possibly from sesamoiditis or osteomyelitis. No dislocation. The joint spaces are normal. SOFT TISSUES: Circumferential subcutaneous edema in the ankle. Dorsal subcutaneous edema in the forefoot. IMPRESSION: 1. Active osteomyelitis involving the head of the 1st metatarsal, base of the  proximal phalanx of the great toe, phalanges of the third and fourth toes, and head of the fourth metatarsal. 2. Oblique fracture through the distal metaphysis of the 4th metatarsal. 3. Possible sesamoiditis or osteomyelitis of the 1st digit sesamoids. 4. Circumferential ankle and dorsal forefoot subcutaneous edema. 5. Large plantar and Achilles calcaneal spurs, posterior subtalar spur, and mild dorsal midfoot spurring. 6. Small well-corticated ossicles below the medial malleolus. Electronically signed by: Ryan Salvage MD 02/05/2024 04:30 PM EDT RP Workstation: [id number]   DG Foot Complete Right Result Date: 02/04/2024 Please see detailed radiograph report in office note.    Procedures   Medications Ordered in the ED  cefTRIAXone (ROCEPHIN) 2 g in sodium chloride  0.9 % 100 mL IVPB (0 g Intravenous Stopped 02/06/24 1158)    And  metroNIDAZOLE (FLAGYL) IVPB 500 mg (500 mg Intravenous New Bag/Given 02/06/24 1217)    And  vancomycin (VANCOREADY) IVPB 1500 mg/300 mL (has no  administration in time range)                                    Medical Decision Making 78 year old male with past medical history of diabetes, hypertension, hyperlipidemia, and pulmonary hypertension presenting to the emergency department today with concern for osteomyelitis of his right foot.  I will cover the patient with vancomycin, Rocephin, and Flagyl here.  He is already had CT imaging.  Podiatry is seeing the patient and request medical admission for IV antibiotics and vascular consult.  The patient will be admitted after his initial workup is complete.  The patient's lactate is mildly elevated here but not to the point of septic shock.  Blood pressures remained stable.  I did discuss the patient's case with podiatry.  They asked that we consult vascular surgery and admit the patient to medicine.  I did discuss the patient's case with Dr. Gari who recommends CT angiograms to evaluate his vasculature further which are ordered.  Calls placed to family medicine service for admission.  Amount and/or Complexity of Data Reviewed Labs: ordered. Radiology: ordered.  Risk Prescription drug management. Decision regarding hospitalization.        Final diagnoses:  Osteomyelitis, unspecified site, unspecified type [facility])    ED Discharge Orders     None          Ula Prentice SAUNDERS, MD 02/06/24 1312

## 2024-02-06 NOTE — H&P (Addendum)
 History and Physical    PatientBETHA THORNE [name] FMW:[id number] DOB: 1945/05/30 DOA: 02/06/2024 DOS: the patient was seen and examined on 02/06/2024 . PCP: Cleatus Arlyss RAMAN, MD  Patient coming from: Home Chief complaint: Chief Complaint  Patient presents with   Fatigue   HPI:  Luke [name] is a 78 y.o. 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   ED Course:  Vital signs in the ED were notable for the following:  Vitals:   02/06/24 1541 02/06/24 1545 02/06/24 1600 02/06/24 1815  BP: (!) 143/71 137/66 135/67 (!) 143/88  Pulse: 85 80 80 92  Temp: 97.8 F (36.6 C)   97.6 F (36.4 C)  Resp: 18 (!) 22 (!) 25 16  Height: 6' 1 (1.854 m)     SpO2: 94% 96% 98% 95%  TempSrc: Oral      >>ED evaluation thus far shows: -CMP with glucose of 253 A1c of 8.6 in June 2025 , BUN 24, AKI 1.49/ 48, normal lft.  -CBC shows white count of 10.8, hb of  -Lactic of 3.2 -Blood cultures collected in the ED x 2. - CT right ankle and foot shows: OM of 1/3rd and 4th toes. Please see complete report below   >>While in the ED patient received the following: Medications  cefTRIAXone (ROCEPHIN) 2 g in sodium chloride  0.9 % 100 mL IVPB (0 g Intravenous Stopped 02/06/24 1158)    And  metroNIDAZOLE (FLAGYL) IVPB 500 mg (0 mg Intravenous Stopped 02/06/24 1317)    And  vancomycin (VANCOREADY) IVPB 1500 mg/300 mL (1,500 mg Intravenous New Bag/Given 02/06/24 1413)  iohexol  (OMNIPAQUE ) 350 MG/ML injection 100 mL (100 mLs Intravenous Contrast Given 02/06/24 1400)   Review of Systems  Musculoskeletal:  Positive for joint pain.  All other systems reviewed and are negative.  Past Medical History:  Diagnosis Date   AAA  (abdominal aortic aneurysm) 2011   Per vascular surgery   Angio-edema    Atrial fibrillation (HCC)    CAD (coronary artery disease)    Presumed CAD with nuclear scan October 09, 2011,  large anteroseptal MI and inferior MI. Catheterization scheduled October 15, 2011   Cardiomyopathy [facility])    Nuclear, October 09, 2011, EF 30%, multiple focal wall motion abnormalities   Chronic kidney disease    CKD3   COVID-19    2021   Diabetes mellitus    type II   GERD (gastroesophageal reflux disease)    Silent   HLD (hyperlipidemia)    Hypertension    white coat HTN-- often elevated in office and controlled on outside checks.   ICD (implantable cardioverter-defibrillator) in place    CRT-D placed March, 2014 complete heart block and k dysfunction   IPF (idiopathic pulmonary fibrosis) (HCC)    LBBB (left bundle branch block)    LBBB on EKG October 11, 2011,  no prior EKG has been done   Low testosterone    Hx of   On home oxygen  therapy    2L as needed   Pacemaker    PAD (peripheral artery disease)    Pancreatitis    Peripheral arterial disease    Umbilical hernia    Past Surgical  History:  Procedure Laterality Date   ABDOMINAL AORTAGRAM N/A 10/28/2011   Procedure: ABDOMINAL EZELLA;  Surgeon: Lonni GORMAN Blade, MD;  Location: [facility] CATH LAB;  Service: Cardiovascular;  Laterality: N/A;   ABDOMINAL AORTIC ANEURYSM REPAIR     EVAR    ABDOMINAL AORTOGRAM N/A 05/27/2022   Procedure: ABDOMINAL AORTOGRAM;  Surgeon: Sheree Penne Lonni, MD;  Location: [facility] INVASIVE CV LAB;  Service: Cardiovascular;  Laterality: N/A;   BI-VENTRICULAR IMPLANTABLE CARDIOVERTER DEFIBRILLATOR N/A 07/16/2012   Procedure: BI-VENTRICULAR IMPLANTABLE CARDIOVERTER DEFIBRILLATOR  (CRT-D);  Surgeon: Elspeth JAYSON Sage, MD;  Location: [facility] CATH LAB;  Service: Cardiovascular;  Laterality: N/A;   BIV ICD GENERATOR CHANGEOUT N/A 10/25/2020   Procedure: BIV ICD GENERATOR CHANGEOUT;  Surgeon: Sage Elspeth JAYSON, MD;  Location: [facility] INVASIVE CV  LAB;  Service: Cardiovascular;  Laterality: N/A;   CARDIAC CATHETERIZATION     CATARACT EXTRACTION Left 2023   COLONOSCOPY  03/23/2018; 2020   CORONARY CTO INTERVENTION N/A 10/10/2022   Procedure: CORONARY CTO INTERVENTION;  Surgeon: Dann Candyce GORMAN, MD;  Location: [facility] INVASIVE CV LAB;  Service: Cardiovascular;  Laterality: N/A;   CORONARY LITHOTRIPSY N/A 09/18/2022   Procedure: CORONARY LITHOTRIPSY;  Surgeon: Dann Candyce GORMAN, MD;  Location: [facility] INVASIVE CV LAB;  Service: Cardiovascular;  Laterality: N/A;   CORONARY STENT INTERVENTION N/A 09/18/2022   Procedure: CORONARY STENT INTERVENTION;  Surgeon: Dann Candyce GORMAN, MD;  Location: [facility] INVASIVE CV LAB;  Service: Cardiovascular;  Laterality: N/A;   CORONARY ULTRASOUND/IVUS N/A 09/18/2022   Procedure: Coronary Ultrasound/IVUS;  Surgeon: Dann Candyce GORMAN, MD;  Location: [facility] INVASIVE CV LAB;  Service: Cardiovascular;  Laterality: N/A;   ENDARTERECTOMY FEMORAL Right 11/19/2022   Procedure: RIGHT COMMON FEMORAL ENDARTERECTOMY WITH VEIN PATCH ANGIOPLASTY;  Surgeon: Sheree Penne Lonni, MD;  Location: [facility] OR;  Service: Vascular;  Laterality: Right;   ESOPHAGOGASTRODUODENOSCOPY N/A 02/12/2019   Procedure: ESOPHAGOGASTRODUODENOSCOPY (EGD);  Surgeon: Charlanne Groom, MD;  Location: [facility] ENDOSCOPY;  Service: Endoscopy;  Laterality: N/A;   ESOPHAGOGASTRODUODENOSCOPY (EGD) WITH PROPOFOL  N/A 02/05/2018   Procedure: ESOPHAGOGASTRODUODENOSCOPY (EGD) WITH PROPOFOL ;  Surgeon: Shila Gustav GAILS, MD;  Location: [facility] ENDOSCOPY;  Service: Endoscopy;  Laterality: N/A;   ESOPHAGOGASTRODUODENOSCOPY (EGD) WITH PROPOFOL  N/A 09/27/2018   Procedure: ESOPHAGOGASTRODUODENOSCOPY (EGD) WITH PROPOFOL ;  Surgeon: Rollin Dover, MD;  Location: [facility] ENDOSCOPY;  Service: Endoscopy;  Laterality: N/A;   FOREIGN BODY REMOVAL  09/27/2018   Procedure: FOREIGN BODY REMOVAL;  Surgeon: Rollin Dover, MD;  Location: [facility] ENDOSCOPY;  Service: Endoscopy;;   FOREIGN BODY REMOVAL   02/12/2019   Procedure: FOREIGN BODY REMOVAL;  Surgeon: Charlanne Groom, MD;  Location: [facility] ENDOSCOPY;  Service: Endoscopy;;   HERNIA REPAIR  05/14/2013   INSERTION OF MESH N/A 05/14/2013   Procedure: INSERTION OF MESH;  Surgeon: Elspeth KYM Schultze, MD;  Location: [facility] OR;  Service: General;  Laterality: N/A;   LEFT HEART CATH N/A 10/10/2022   Procedure: Left Heart Cath;  Surgeon: Dann Candyce GORMAN, MD;  Location: [facility] INVASIVE CV LAB;  Service: Cardiovascular;  Laterality: N/A;   LEFT HEART CATH AND CORONARY ANGIOGRAPHY N/A 07/17/2021   Procedure: LEFT HEART CATH AND CORONARY ANGIOGRAPHY;  Surgeon: Burnard Debby LABOR, MD;  Location: [facility] INVASIVE CV LAB;  Service: Cardiovascular;  Laterality: N/A;   LEFT HEART CATH AND CORONARY ANGIOGRAPHY N/A 09/18/2022   Procedure: LEFT HEART CATH AND CORONARY ANGIOGRAPHY;  Surgeon: Dann Candyce GORMAN, MD;  Location: [facility] INVASIVE CV LAB;  Service: Cardiovascular;  Laterality: N/A;   LOWER EXTREMITY ANGIOGRAM Bilateral 10/28/2011   Procedure:  LOWER EXTREMITY ANGIOGRAM;  Surgeon: Lonni GORMAN Blade, MD;  Location: [facility] CATH LAB;  Service: Cardiovascular;  Laterality: Bilateral;   LOWER EXTREMITY ANGIOGRAM Right 11/19/2022   Procedure: RIGHT LOWER EXTREMITY ANGIOGRAM;  Surgeon: Sheree Penne Lonni, MD;  Location: [facility] OR;  Service: Vascular;  Laterality: Right;   LOWER EXTREMITY ANGIOGRAPHY  05/27/2022   Procedure: Lower Extremity Angiography;  Surgeon: Sheree Penne Lonni, MD;  Location: [facility] INVASIVE CV LAB;  Service: Cardiovascular;;   PACEMAKER INSERTION  07/16/2012   pacemaker/defibrilator   PATCH ANGIOPLASTY Right 11/19/2022   Procedure: VEIN PATCH ANGIOPLASTY;  Surgeon: Sheree Penne Lonni, MD;  Location: [facility] OR;  Service: Vascular;  Laterality: Right;   POLYPECTOMY     POSTERIOR CERVICAL FUSION/FORAMINOTOMY N/A 06/09/2014   Procedure: Laminectomy - Cervical two-Cervcial four posterior cervical instrumented fusion Cervical two-cervical four;  Surgeon:  Alm GORMAN Molt, MD;  Location: [facility] NEURO ORS;  Service: Neurosurgery;  Laterality: N/A;  posterior    RIGHT/LEFT HEART CATH AND CORONARY ANGIOGRAPHY N/A 10/10/2023   Procedure: RIGHT/LEFT HEART CATH AND CORONARY ANGIOGRAPHY;  Surgeon: Elmira Newman PARAS, MD;  Location: [facility] INVASIVE CV LAB;  Service: Cardiovascular;  Laterality: N/A;   SAVORY DILATION N/A 02/05/2018   Procedure: SAVORY DILATION;  Surgeon: Shila Gustav GAILS, MD;  Location: [facility] ENDOSCOPY;  Service: Endoscopy;  Laterality: N/A;   TONSILLECTOMY     as a child    UMBILICAL HERNIA REPAIR N/A 05/14/2013   Procedure: LAPAROSCOPIC exploration and repair of hernia in abdominal ;  Surgeon: Elspeth KYM Schultze, MD;  Location: [facility] OR;  Service: General;  Laterality: N/A;   UPPER GASTROINTESTINAL ENDOSCOPY  2020    reports that he quit smoking about 19 years ago. His smoking use included cigarettes. He started smoking about 61 years ago. He has a 84 pack-year smoking history. He has never been exposed to tobacco smoke. He has never used smokeless tobacco. He reports that he does not currently use alcohol. He reports that he does not use drugs. Allergies  Allergen Reactions   Aldactone  [Spironolactone ] Other (See Comments)    Hyperkalemia   Brilinta  [Ticagrelor ] Shortness Of Breath    Numbness in hands and feet Blurry vision   Clopidogrel  Rash    Redness and Itchiness   Codeine Rash and Hives   Amoxicillin  Hives   Atorvastatin  Other (See Comments)    Myalgias with lipitor.  Does tolerate simvastatin .     Benadryl  [Diphenhydramine ] Hives   Januvia  [Sitagliptin ] Other (See Comments)    Diarrhea and heart racing   Jardiance  [Empagliflozin ] Other (See Comments)    Polyuria; excessive weight loss   Lisinopril  Other (See Comments)    Possible cause of pancreatitis   Rosuvastatin  Other (See Comments)    myalgia   Lasix  [Furosemide ] Rash   Family History  Problem Relation Age of Onset   Hypertension Mother    Stroke Mother     Hyperlipidemia Mother    Lung cancer Father    Diabetes Sister    Heart disease Sister        Before age 43   Hypertension Sister    Hyperlipidemia Sister    Heart attack Sister    Hypertension Son    Colon cancer Neg Hx    Prostate cancer Neg Hx    Esophageal cancer Neg Hx    Stomach cancer Neg Hx    Rectal cancer Neg Hx    Colon polyps Neg Hx    Pancreatic cancer Neg Hx    Prior to Admission  medications   Medication Sig Start Date End Date Taking? Authorizing Provider  aspirin  EC 81 MG tablet Take 1 tablet (81 mg total) by mouth daily. 09/18/22   Darryle Thom CROME, PA-C  carvedilol  (COREG ) 25 MG tablet TAKE 1 TABLET BY MOUTH TWICE DAILY WITH MEALS 02/28/23   Jeffrie Oneil BROCKS, MD  Cholecalciferol  (VITAMIN D3) 50 MCG (2000 UT) TABS Take 2,000 Units by mouth in the morning.    [provider]  ciprofloxacin  (CIPRO ) 500 MG tablet Take 1 tablet (500 mg total) by mouth 2 (two) times daily. 02/04/24   Gershon Donnice SAUNDERS, DPM  Cyanocobalamin  (VITAMIN B-12 PO) Take 2,000 mcg by mouth in the morning.    [provider]  doxycycline  (VIBRA -TABS) 100 MG tablet Take 1 tablet (100 mg total) by mouth 2 (two) times daily. 02/04/24   Gershon Donnice SAUNDERS, DPM  glipiZIDE  (GLUCOTROL ) 5 MG tablet Take 2 tablets (10 mg total) by mouth daily. With supper. 10/06/23   Cleatus Arlyss RAMAN, MD  glucose blood (ACCU-CHEK AVIVA PLUS) test strip USE AS DIRECTED TO TEST BLOOD SUGAR TWICE DAILY 04/15/23   Cleatus Arlyss RAMAN, MD  insulin  glargine (LANTUS  SOLOSTAR) 100 UNIT/ML Solostar Pen INJECT 0.3 TO 0.35 MLS(30 TO 35 UNITS) INTO THE SKIN EVERY DAY 03/29/20   Cleatus Arlyss RAMAN, MD  metFORMIN  (GLUCOPHAGE ) 500 MG tablet TAKE 2 TABLETS BY MOUTH TWICE DAILY WITH FOOD 04/29/23   Cleatus Arlyss RAMAN, MD  Multiple Vitamin (MULTIVITAMIN WITH MINERALS) TABS tablet Take 1 tablet by mouth in the morning.    [provider]  omeprazole  (PRILOSEC) 40 MG capsule TAKE 1 CAPSULE(40 MG) BY MOUTH DAILY 08/14/23   Nandigam,  Kavitha V, MD  prasugrel  (EFFIENT ) 10 MG TABS tablet Take 0.5 tablets (5 mg total) by mouth in the morning and at bedtime. 10/06/23   Cleatus Arlyss RAMAN, MD  sacubitril-valsartan  (ENTRESTO ) 49-51 MG Take 1 tablet by mouth 2 (two) times daily.    [provider]  silver  sulfADIAZINE  (SILVADENE ) 1 % cream Apply 1 Application topically daily. 10/14/23   Iva Marty Saltness, MD  simvastatin  (ZOCOR ) 20 MG tablet TAKE 1 TABLET(20 MG) BY MOUTH AT BEDTIME 10/13/23   Walker, Caitlin S, NP  torsemide  (DEMADEX ) 20 MG tablet TAKE 1 TABLET(20 MG) BY MOUTH DAILY 11/25/23   Jeffrie Oneil BROCKS, MD  triamcinolone  ointment (KENALOG ) 0.1 % Apply topically 2 (two) times daily. 12/04/23   Iva Marty Saltness, MD  TYVASO DPI TITRATION KIT 16 & 32 & 48 MCG POWD Inhale into the lungs. 12/02/23   [provider]  umeclidinium bromide  (INCRUSE ELLIPTA ) 62.5 MCG/ACT AEPB Inhale 1 puff into the lungs daily. 09/06/22   Geronimo Amel, MD  valsartan  (DIOVAN ) 40 MG tablet Take 1 tablet (40 mg total) by mouth daily with supper. 10/08/23   Jeffrie Oneil BROCKS, MD                                                                                 Vitals:   02/06/24 1541 02/06/24 1545 02/06/24 1600 02/06/24 1815  BP: (!) 143/71 137/66 135/67 (!) 143/88  Pulse: 85 80 80 92  Resp: 18 (!) 22 (!) 25 16  Temp: 97.8  F (36.6 C)   97.6 F (36.4 C)  TempSrc: Oral     SpO2: 94% 96% 98% 95%  Height: 6' 1 (1.854 m)      Physical Exam Vitals reviewed.  Constitutional:      General: He is not in acute distress.    Appearance: He is not ill-appearing.  HENT:     Head: Normocephalic.  Eyes:     Extraocular Movements: Extraocular movements intact.  Cardiovascular:     Rate and Rhythm: Normal rate and regular rhythm.     Pulses:          Dorsalis pedis pulses are 0 on the right side and 0 on the left side.       Posterior tibial pulses are 0 on the right side and 0 on the left side.     Heart sounds: Normal heart sounds.   Pulmonary:     Breath sounds: Normal breath sounds.  Abdominal:     General: There is no distension.     Palpations: Abdomen is soft.     Tenderness: There is no abdominal tenderness.  Musculoskeletal:     Right lower leg: Edema present.     Left lower leg: No edema.  Neurological:     General: No focal deficit present.     Mental Status: He is alert and oriented to person, place, and time.     Labs on Admission: I have personally reviewed following labs and imaging studies CBC: Recent Labs  Lab 02/06/24 1121  WBC 10.8*  NEUTROABS 9.3*  HGB 11.8*  HCT 36.4*  MCV 102.8*  PLT 355   Basic Metabolic Panel: Recent Labs  Lab 02/06/24 1121  NA 137  K 4.3  CL 101  CO2 20*  GLUCOSE 253*  BUN 24*  CREATININE 1.49*  CALCIUM  9.2   GFR: Estimated Creatinine Clearance: 46.9 mL/min (A) (by C-G formula based on SCr of 1.49 mg/dL (H)). Liver Function Tests: Recent Labs  Lab 02/06/24 1121  AST 17  ALT 12  ALKPHOS 56  BILITOT 1.1  PROT 7.5  ALBUMIN  3.1*   No results for input(s): LIPASE, AMYLASE in the last 168 hours. No results for input(s): AMMONIA in the last 168 hours. Recent Labs    04/24/23 1406 10/06/23 1337 02/06/24 1121  BUN 37* 35* 24*  CREATININE 1.33* 1.47 1.49*    Cardiac Enzymes: No results for input(s): CKTOTAL, CKMB, CKMBINDEX, TROPONINI in the last 168 hours. BNP (last 3 results) No results for input(s): PROBNP in the last 8760 hours. HbA1C: No results for input(s): HGBA1C in the last 72 hours. CBG: Recent Labs  Lab 02/06/24 1829  GLUCAP 219*   Lipid Profile: No results for input(s): CHOL, HDL, LDLCALC, TRIG, CHOLHDL, LDLDIRECT in the last 72 hours. Thyroid  Function Tests: No results for input(s): TSH, T4TOTAL, FREET4, T3FREE, THYROIDAB in the last 72 hours. Anemia Panel: No results for input(s): VITAMINB12, FOLATE, FERRITIN, TIBC, IRON, RETICCTPCT in the last 72 hours. Urine  analysis:    Component Value Date/Time   COLORURINE YELLOW 11/19/2022 0755   APPEARANCEUR CLEAR 11/19/2022 0755   LABSPEC 1.020 11/19/2022 0755   PHURINE 5.5 11/19/2022 0755   GLUCOSEU NEGATIVE 11/19/2022 0755   HGBUR NEGATIVE 11/19/2022 0755   BILIRUBINUR NEGATIVE 11/19/2022 0755   KETONESUR NEGATIVE 11/19/2022 0755   PROTEINUR NEGATIVE 11/19/2022 0755   UROBILINOGEN 0.2 11/27/2011 1102   NITRITE NEGATIVE 11/19/2022 0755   LEUKOCYTESUR NEGATIVE 11/19/2022 0755   Radiological Exams on Admission: VAS US  LOWER  EXTREMITY VENOUS (DVT) Result Date: 02/06/2024  Lower Venous DVT Study Patient Name:  TRAYVEON BECKFORD  Date of Exam:   02/06/2024 Medical Rec #: [id number]         Accession #:    [id number] Date of Birth: 04/28/1946          Patient Gender: M Patient Age:   35 years Exam Location:  [facility] Procedure:      VAS US  LOWER EXTREMITY VENOUS (DVT) Referring Phys: Gennavieve Huq --------------------------------------------------------------------------------  Indications: Edema. Other Indications: DM foot infections. Comparison Study: Previous exam on 10/05/2022 was negative for DVT Performing Technologist: Ezzie Potters RVT, RDMS  Examination Guidelines: A complete evaluation includes B-mode imaging, spectral Doppler, color Doppler, and power Doppler as needed of all accessible portions of each vessel. Bilateral testing is considered an integral part of a complete examination. Limited examinations for reoccurring indications may be performed as noted. The reflux portion of the exam is performed with the patient in reverse Trendelenburg.  +---------+---------------+---------+-----------+----------+--------------+ RIGHT    CompressibilityPhasicitySpontaneityPropertiesThrombus Aging +---------+---------------+---------+-----------+----------+--------------+ CFV      Full           Yes      Yes                                  +---------+---------------+---------+-----------+----------+--------------+ SFJ                                                   not visualized +---------+---------------+---------+-----------+----------+--------------+ FV Prox  Full           Yes      Yes                                 +---------+---------------+---------+-----------+----------+--------------+ FV Mid   Full           Yes      Yes                                 +---------+---------------+---------+-----------+----------+--------------+ FV DistalFull           Yes      Yes                                 +---------+---------------+---------+-----------+----------+--------------+ PFV      Full                                                        +---------+---------------+---------+-----------+----------+--------------+ POP      Full           Yes      Yes                                 +---------+---------------+---------+-----------+----------+--------------+ PTV      Full                                                        +---------+---------------+---------+-----------+----------+--------------+  PERO     Full                                                        +---------+---------------+---------+-----------+----------+--------------+   +----+---------------+---------+-----------+----------+--------------+ LEFTCompressibilityPhasicitySpontaneityPropertiesThrombus Aging +----+---------------+---------+-----------+----------+--------------+ CFV Full           Yes      Yes                                 +----+---------------+---------+-----------+----------+--------------+     Summary: RIGHT: - There is no evidence of deep vein thrombosis in the lower extremity.  - No cystic structure found in the popliteal fossa.  LEFT: - No evidence of common femoral vein obstruction.   *See table(s) above for measurements and observations.    Preliminary    DG Chest Portable 1  View Result Date: 02/06/2024 CLINICAL DATA:  Hypoxia. EXAM: PORTABLE CHEST 1 VIEW COMPARISON:  10/29/2022, chest CT 01/20/2023 FINDINGS: Left-sided pacemaker unchanged. Lungs are hypoinflated demonstrate coarse interstitial changes over the mid to lower lungs with possible mild mild slight progression compatible with known pulmonary fibrosis. No definite new airspace process or effusion. Cardiomediastinal silhouette and remainder of the exam is unchanged. IMPRESSION: Hypoinflation with coarse interstitial changes over the mid to lower lungs with possible mild progression compatible with known pulmonary fibrosis. Electronically Signed   By: Toribio Agreste M.D.   On: 02/06/2024 16:43   CT ANGIO LOWER EXT BILAT W &/OR WO CONTRAST Result Date: 02/06/2024 CLINICAL DATA:  Right foot wound, claudication EXAM: CT ANGIOGRAPHY OF ABDOMINAL AORTA WITH ILIOFEMORAL RUNOFF TECHNIQUE: Multidetector CT imaging of the abdomen, pelvis and lower extremities was performed using the standard protocol during bolus administration of intravenous contrast. Multiplanar CT image reconstructions and MIPs were obtained to evaluate the vascular anatomy. RADIATION DOSE REDUCTION: This exam was performed according to the departmental dose-optimization program which includes automated exposure control, adjustment of the mA and/or kV according to patient size and/or use of iterative reconstruction technique. CONTRAST:  OMNIPAQUE  IOHEXOL  350 MG/ML SOLN COMPARISON:  CT scan the abdomen pelvis 08/21/2021 FINDINGS: VASCULAR Aorta: Partially imaged endovascular aortic graft for both the right and left lobes are visible but the body of the graft is not. Treated aneurysm of the infrarenal abdominal aorta. RIGHT Lower Extremity Inflow: Widely patent. Outflow: Mild aneurysmal dilation of the right common femoral artery likely at the site of prior endarterectomy. The vessel measures up to 1.4 cm in diameter. Profunda femoral artery is widely patent.  Critical stenosis of the proximal superficial femoral artery due to either peripheral wall adherent mural thrombus or extensive fibrofatty atherosclerotic plaque. Second tandem high-grade stenosis also present in proximal thigh. Heavily calcified atherosclerotic plaque in the mid thigh results complete occlusion of the SFA. The SFA remains occluded through Hunter's canal before reconstituting at the P1 segment of the popliteal artery. Extensive multifocal calcified plaque results in moderate stenosis of the P1 segment. The popliteal artery then occludes completely at the P2 segment and remains occluded through the P3 segment. Runoff: Reconstitution of the runoff vessels at the origin of the anterior tibial artery. Patent 3 vessel runoff to the ankle. LEFT Lower Extremity Inflow: Common, internal and external iliac arteries are patent without evidence of aneurysm, dissection, vasculitis or significant stenosis. Outflow: Scattered calcified  plaque along the common femoral artery with mild luminal narrowing. The profunda femoral artery is widely patent. Bulky calcified plaque results in critical stenosis of the origin of the superficial femoral artery. High-grade stenosis also present within the SFA in the distal thigh just proximal to Hunter's canal. Focal high-grade stenosis of the proximal P3 segment of the popliteal artery. The artery remains patent. Runoff: Likely segmental occlusion of the anterior tibial artery proximal 2 vessel runoff to the ankle. Veins: No focal venous abnormality. Review of the MIP images confirms the above findings. NON-VASCULAR Adrenals/Urinary Tract: Visualized kidneys, ureter and bladder are all within normal limits. Stomach/Bowel: No focal bowel wall thickening or evidence of obstruction. Normal appendix. Lymphatic: No suspicious lymphadenopathy. Reproductive: Mild prostatomegaly. Other: Fat containing right inguinal hernia.  No ascites. Musculoskeletal: Multilevel degenerative disc  disease. No acute fracture or malalignment. No lytic or blastic osseous lesion. IMPRESSION: VASCULAR 1. Severe right worse than left femoropopliteal disease as detailed above. 2. Partially imaged endovascular aortic repair with a bifurcated endovascular prosthesis. The iliac limbs are widely patent and there is no significant inflow disease. 3. Patent 3 vessel runoff to the ankle right. 4. Short segment occlusion of the left anterior tibial artery with patent 2 vessel runoff to the ankle. 5. Mild ectasias a of the right common femoral artery likely at the site of prior endarterectomy. Maximal vessel diameter 1.4 cm. NON-VASCULAR 1. Fat containing right inguinal hernia. 2. Multilevel degenerative disc disease. Electronically Signed   By: Wilkie Lent M.D.   On: 02/06/2024 16:11   CT FOOT RIGHT WO CONTRAST Result Date: 02/05/2024 EXAM: CT RIGHT FOOT AND ANKLE, WITHOUT IV CONTRAST 02/05/2024 04:15:00 PM TECHNIQUE: Axial images were acquired through the right foot and ankle without IV contrast. Reformatted images were reviewed. Automated exposure control, iterative reconstruction, and/or weight based adjustment of the mA/kV was utilized to reduce the radiation dose to as low as reasonably achievable. COMPARISON: None available. CLINICAL HISTORY: Right foot swelling, suspected osteomyelitis, history of diabetes and PAD, draining wounds, subacute osteomyelitis on prior X-ray. FINDINGS: BONES AND JOINTS: Destructive findings in the head of the 1st metatarsal, base of the proximal phalanx great toe, phalanges of the third toe, phalanges of the fourth toe, and in the head of the fourth metatarsal compatible with active osteomyelitis. Oblique fracture through the distal metaphysis of the 4th metatarsal. Small well corticated ossicles below the medial malleolus. Large plantar and achilles calcaneal spurs. Posterior spur angle in the posterior subtalar joint. Mild dorsal midfoot spurring. Mild scallop angle in the  superior margins of the 1st digit sesamoids possibly from sesamoiditis or osteomyelitis. No dislocation. The joint spaces are normal. SOFT TISSUES: Circumferential subcutaneous edema in the ankle. Dorsal subcutaneous edema in the forefoot. IMPRESSION: 1. Active osteomyelitis involving the head of the 1st metatarsal, base of the proximal phalanx of the great toe, phalanges of the third and fourth toes, and head of the fourth metatarsal. 2. Oblique fracture through the distal metaphysis of the 4th metatarsal. 3. Possible sesamoiditis or osteomyelitis of the 1st digit sesamoids. 4. Circumferential ankle and dorsal forefoot subcutaneous edema. 5. Large plantar and Achilles calcaneal spurs, posterior subtalar spur, and mild dorsal midfoot spurring. 6. Small well-corticated ossicles below the medial malleolus. Electronically signed by: Ryan Salvage MD 02/05/2024 04:30 PM EDT RP Workstation: [id number]   Data Reviewed: Relevant notes from primary care and specialist visits, past discharge summaries as available in EHR, including Care Everywhere . Prior diagnostic testing as pertinent to current admission diagnoses, Updated medications  and problem lists for reconciliation .ED course, including vitals, labs, imaging, treatment and response to treatment,Triage notes, nursing and pharmacy notes and ED provider's notes.Notable results as noted in HPI.Discussed case with EDMD/ ED APP/ or Specialty MD on call and as needed.  Assessment & Plan  >> Sepsis/Right foot osteomyelitis: Admit to med telemetry.  Cont broad-spectrum IV antibiotics with vancomycin cefepime and Flagyl. Gentle IVF.   Podiatry and vascular consulted. No external wound care currently skin intact.       >>Hypoxia: Pt noted to have 02 sats of 89% on RA and is started on oxygen . Chest xray is ordered and pending. Continuous pulse oximetry and venous blood gas.  CXR shows progression of known pulmonary fibrosis.  Will start inhaled  Pulmicort.     >>DM II: Metformin / glipizide / insulin  held. Glycemic protocol and carb consistent diet.  A1c.    >>AKI: Lab Results  Component Value Date   CREATININE 1.49 (H) 02/06/2024   CREATININE 1.47 10/06/2023   CREATININE 1.33 (H) 04/24/2023  Hold metformin .  Hold valsartan .  Hold torsemide .  Avoid contrast. Renally dose meds.   >>Anemia with macrocytosis:    Latest Ref Rng & Units 02/06/2024   11:21 AM 10/10/2023   10:40 AM 10/10/2023   10:33 AM  CBC  WBC 4.0 - 10.5 K/uL 10.8     Hemoglobin 13.0 - 17.0 g/dL 88.1  88.0  88.0   Hematocrit 39.0 - 52.0 % 36.4  35.0  35.0   Platelets 150 - 400 K/uL 355     Stable/ anemia panel/ iv ppi/ type/ screen/ occult.    >>PAD history of right common femoral endarterectomy and vein patch angioplasty: Pt has had PCI in past. Vascular has been consulted.    >>Unilateral edema: Will get Venous doppler.    >> Chronic CHF with recovered EF: Clinically pt is euvolemic , no reports of SOB or chest pain.  Entresto  held due to [facility] along with diuretics.  Strict I's and O's, cautious IV fluid administration if and as needed.   >>H/O CHB S/P Abbott CRT Most recently interrogated on January 02, 2024. Cardiology consult as deemed appropriate.   DVT prophylaxis:  Heparin .   Consults:  Vascular.  Podiatry.   Advance Care Planning:    Code Status: Full Code   Family Communication:  None.   Disposition Plan:  Home.   Severity of Illness: The appropriate patient status for this patient is INPATIENT. Inpatient status is judged to be reasonable and necessary in order to provide the required intensity of service to ensure the patient's safety. The patient's presenting symptoms, physical exam findings, and initial radiographic and laboratory data in the context of their chronic comorbidities is felt to place them at high risk for further clinical deterioration. Furthermore, it is not anticipated that the patient will be medically  stable for discharge from the hospital within 2 midnights of admission.   * I certify that at the point of admission it is my clinical judgment that the patient will require inpatient hospital care spanning beyond 2 midnights from the point of admission due to high intensity of service, high risk for further deterioration and high frequency of surveillance required.*  Unresulted Labs (From admission, onward)     Start     Ordered   02/07/24 0500  Protime-INR  Tomorrow morning,   R        02/06/24 1552   02/07/24 0500  Cortisol-am, blood  Tomorrow morning,   R  02/06/24 1552   02/07/24 0500  CBC with Differential/Platelet  Tomorrow morning,   R        02/06/24 1902   02/07/24 0500  Comprehensive metabolic panel with GFR  Tomorrow morning,   R        02/06/24 1902   02/07/24 0500  Magnesium   Tomorrow morning,   R        02/06/24 1902   02/07/24 0500  Phosphorus  Tomorrow morning,   R        02/06/24 1902   02/06/24 1853  Lactic acid, plasma  (Lactic Acid)  STAT Now then every 2 hours,   STAT      02/06/24 1852   02/06/24 1549  Hemoglobin A1c  Once,   R       Comments: To assess prior glycemic control    02/06/24 1552   02/06/24 1434  C-reactive protein  Add-on,   AD        02/06/24 1433   02/06/24 1434  Sedimentation rate  Add-on,   AD        02/06/24 1433   02/06/24 1100  Blood Cultures x 2 sites  BLOOD CULTURE X 2,   STAT      02/06/24 1101            Meds ordered this encounter  Medications   DISCONTD: cefTRIAXone (ROCEPHIN) 2 g in sodium chloride  0.9 % 100 mL IVPB    Antibiotic Indication::   Other Indication (list below)    Other Indication::   Wound infection   DISCONTD: metroNIDAZOLE (FLAGYL) IVPB 500 mg    Antibiotic Indication::   Other Indication (list below)    Other Indication::   Wound infection   DISCONTD: vancomycin (VANCOCIN) IVPB 1000 mg/200 mL premix    Indication::   Wound Infection   AND Linked Order Group    vancomycin (VANCOREADY) IVPB 1500  mg/300 mL     Indication::   Wound Infection    cefTRIAXone (ROCEPHIN) 2 g in sodium chloride  0.9 % 100 mL IVPB     Antibiotic Indication::   Other Indication (list below)     Other Indication::   Wound infection    metroNIDAZOLE (FLAGYL) IVPB 500 mg     Antibiotic Indication::   Other Indication (list below)     Other Indication::   Wound infection   iohexol  (OMNIPAQUE ) 350 MG/ML injection 100 mL   DISCONTD: insulin  aspart (novoLOG ) injection 0-15 Units    Correction coverage::   Moderate (average weight, post-op)    CBG < 70::   implement hypoglycemia protocol    CBG 70 - 120::   0 units    CBG 121 - 150::   2 units    CBG 151 - 200::   3 units    CBG 201 - 250::   5 units    CBG 251 - 300::   8 units    CBG 301 - 350::   11 units    CBG 351 - 400::   15 units    CBG > 400:   call MD and obtain STAT lab verification   lactated ringers  infusion   DISCONTD: heparin  injection 5,000 Units   OR Linked Order Group    acetaminophen  (TYLENOL ) tablet 650 mg    acetaminophen  (TYLENOL ) suppository 650 mg   polyethylene glycol (MIRALAX  / GLYCOLAX ) packet 17 g   bisacodyl  (DULCOLAX) EC tablet 5 mg   Treprostinil POWD 64 mcg   DISCONTD: 0.9 %  sodium chloride  infusion   budesonide (PULMICORT) nebulizer solution 0.5 mg   metroNIDAZOLE (FLAGYL) IVPB 500 mg    Antibiotic Indication::   Other Indication (list below)   insulin  aspart (novoLOG ) injection 0-15 Units    Correction coverage::   Moderate (average weight, post-op)    CBG < 70::   implement hypoglycemia protocol    CBG 70 - 120::   0 units    CBG 121 - 150::   2 units    CBG 151 - 200::   3 units    CBG 201 - 250::   5 units    CBG 251 - 300::   8 units    CBG 301 - 350::   11 units    CBG 351 - 400::   15 units    CBG > 400:   call MD and obtain STAT lab verification     Orders Placed This Encounter  Procedures   Blood Cultures x 2 sites   CT ANGIO LOWER EXT BILAT W &/OR WO CONTRAST   DG Chest Portable 1 View    Comprehensive metabolic panel   CBC with Differential   C-reactive protein   Sedimentation rate   Protime-INR   Cortisol-am, blood   Hemoglobin A1c   Glucose, capillary   Lactic acid, plasma   CBC with Differential/Platelet   Comprehensive metabolic panel with GFR   Magnesium    Phosphorus   Diet Carb Modified Fluid consistency: Thin; Room service appropriate? Yes   Diet NPO time specified   Initiate Carrier Fluid Protocol   Apply Diabetes Mellitus Care Plan   STAT CBG when hypoglycemia is suspected. If treated, recheck every 15 minutes after each treatment until CBG >/= 70 mg/dl   Refer to Hypoglycemia Protocol Sidebar Report for treatment of CBG < 70 mg/dl   No HS correction Insulin    Vital signs   Notify physician (specify)   Refer to Sidebar Report Mobility Protocol for Adult Inpatient   If lactate (lactic acid) >2, verify repeat lactic acid order has been placed to be drawn   Document vital signs within 1-hour of fluid bolus completion and notify provider of bolus completion   Vital signs   Vital signs   RN to call RRT (rapid response team)   Initiate Adult Central Line Maintenance and Catheter Protocol for patients with central line (CVC, PICC, Port, Hemodialysis, Trialysis)   Apply Sepsis Care Plan   Refer to Sidebar Report: Sepsis Bundle ED/IP   Assess and Document Glasgow Coma Scale   Initiate Oral Care Protocol   Initiate Carrier Fluid Protocol   RN may order General Admission PRN Orders utilizing General Admission PRN medications (through manage orders) for the following patient needs: allergy symptoms (Claritin ), cold sores (Carmex), cough (Robitussin DM), eye irritation (Liquifilm Tears), hemorrhoids (Tucks), indigestion (Maalox), minor skin irritation (Hydrocortisone Cream), muscle pain (Ben Gay), nose irritation (saline nasal spray) and sore throat (Chloraseptic spray).   Cardiac Monitoring - Continuous Indefinite   Ambulate with assistance   Full code   Consult  to Peripheral Vascular Navigator   Consult to family practice   Consult to hospitalist   vancomycin per pharmacy consult   CeFEPIme (MAXIPIME) per pharmacy consult            Pulse oximetry (single)   Incentive spirometry RT   I-Stat Lactic Acid, ED   ECHOCARDIOGRAM COMPLETE   Admit to Inpatient (patient's expected length of stay will be greater than 2 midnights or inpatient only procedure)  Aspiration precautions   Fall precautions   VAS US  LOWER EXTREMITY VENOUS (DVT)    Author: Mario LULLA Blanch, MD 12 pm- 8 pm. [facility]. 02/06/2024 7:05 PM Please note for any communication after hours contact [facility] Assigned provider on call on Amion.

## 2024-02-06 NOTE — ED Triage Notes (Deleted)
 Pt. BIB [facility] from [facility] with c/o post-operative problems; Pt. Had gallbladder removed back in August and developed an abscess; Pt. Is lethargic, pale, and diaphoretic upon arrival; Pt. Also experiencing N/V; [facility] gave 100mls of LR and 4mg  of Zofran ; Pt. Has a 20G in L AC  VS per [facility]:  HR: 112 RR: 24 CBG: 262

## 2024-02-06 NOTE — ED Notes (Signed)
 Bilateral pedal pulses marked with skin marker and doppler.

## 2024-02-06 NOTE — Progress Notes (Signed)
 Patient arrived to room. Alert and oriented x4. VSS. On 2L oxygen . Call bell within reach.

## 2024-02-06 NOTE — Consult Note (Signed)
 ED Consult    Reason for Consult: Right foot wound Referring Physician: Dr. Ula MRN #:  [id number]  History of Present Illness: This is a 78 y.o. male has history of right common femoral endarterectomy.  At the time he was considered for bypass however he had poorly healing skin below the knee from Plavix  administration that healed with steroids.  He ultimately underwent right common femoral endarterectomy with vein patch angioplasty.  He is now here with toe infection.  Past Medical History:  Diagnosis Date   AAA (abdominal aortic aneurysm) 2011   Per vascular surgery   Angio-edema    Atrial fibrillation (HCC)    CAD (coronary artery disease)    Presumed CAD with nuclear scan October 09, 2011,  large anteroseptal MI and inferior MI. Catheterization scheduled October 15, 2011   Cardiomyopathy [facility])    Nuclear, October 09, 2011, EF 30%, multiple focal wall motion abnormalities   Chronic kidney disease    CKD3   COVID-19    2021   Diabetes mellitus    type II   GERD (gastroesophageal reflux disease)    Silent   HLD (hyperlipidemia)    Hypertension    white coat HTN-- often elevated in office and controlled on outside checks.   ICD (implantable cardioverter-defibrillator) in place    CRT-D placed March, 2014 complete heart block and k dysfunction   IPF (idiopathic pulmonary fibrosis) (HCC)    LBBB (left bundle branch block)    LBBB on EKG October 11, 2011,  no prior EKG has been done   Low testosterone    Hx of   On home oxygen  therapy    2L as needed   Pacemaker    PAD (peripheral artery disease)    Pancreatitis    Peripheral arterial disease    Umbilical hernia     Past Surgical History:  Procedure Laterality Date   ABDOMINAL AORTAGRAM N/A 10/28/2011   Procedure: ABDOMINAL EZELLA;  Surgeon: Lonni GORMAN Blade, MD;  Location: [facility] CATH LAB;  Service: Cardiovascular;  Laterality: N/A;   ABDOMINAL AORTIC ANEURYSM REPAIR     EVAR    ABDOMINAL AORTOGRAM N/A 05/27/2022    Procedure: ABDOMINAL AORTOGRAM;  Surgeon: Sheree Penne Lonni, MD;  Location: [facility] INVASIVE CV LAB;  Service: Cardiovascular;  Laterality: N/A;   BI-VENTRICULAR IMPLANTABLE CARDIOVERTER DEFIBRILLATOR N/A 07/16/2012   Procedure: BI-VENTRICULAR IMPLANTABLE CARDIOVERTER DEFIBRILLATOR  (CRT-D);  Surgeon: Elspeth JAYSON Sage, MD;  Location: [facility] CATH LAB;  Service: Cardiovascular;  Laterality: N/A;   BIV ICD GENERATOR CHANGEOUT N/A 10/25/2020   Procedure: BIV ICD GENERATOR CHANGEOUT;  Surgeon: Sage Elspeth JAYSON, MD;  Location: [facility] INVASIVE CV LAB;  Service: Cardiovascular;  Laterality: N/A;   CARDIAC CATHETERIZATION     CATARACT EXTRACTION Left 2023   COLONOSCOPY  03/23/2018; 2020   CORONARY CTO INTERVENTION N/A 10/10/2022   Procedure: CORONARY CTO INTERVENTION;  Surgeon: Dann Candyce GORMAN, MD;  Location: [facility] INVASIVE CV LAB;  Service: Cardiovascular;  Laterality: N/A;   CORONARY LITHOTRIPSY N/A 09/18/2022   Procedure: CORONARY LITHOTRIPSY;  Surgeon: Dann Candyce GORMAN, MD;  Location: [facility] INVASIVE CV LAB;  Service: Cardiovascular;  Laterality: N/A;   CORONARY STENT INTERVENTION N/A 09/18/2022   Procedure: CORONARY STENT INTERVENTION;  Surgeon: Dann Candyce GORMAN, MD;  Location: [facility] INVASIVE CV LAB;  Service: Cardiovascular;  Laterality: N/A;   CORONARY ULTRASOUND/IVUS N/A 09/18/2022   Procedure: Coronary Ultrasound/IVUS;  Surgeon: Dann Candyce GORMAN, MD;  Location: [facility] INVASIVE CV LAB;  Service: Cardiovascular;  Laterality: N/A;  ENDARTERECTOMY FEMORAL Right 11/19/2022   Procedure: RIGHT COMMON FEMORAL ENDARTERECTOMY WITH VEIN PATCH ANGIOPLASTY;  Surgeon: Sheree Penne Bruckner, MD;  Location: [facility] OR;  Service: Vascular;  Laterality: Right;   ESOPHAGOGASTRODUODENOSCOPY N/A 02/12/2019   Procedure: ESOPHAGOGASTRODUODENOSCOPY (EGD);  Surgeon: Charlanne Groom, MD;  Location: [facility] ENDOSCOPY;  Service: Endoscopy;  Laterality: N/A;   ESOPHAGOGASTRODUODENOSCOPY (EGD) WITH PROPOFOL  N/A 02/05/2018   Procedure:  ESOPHAGOGASTRODUODENOSCOPY (EGD) WITH PROPOFOL ;  Surgeon: Shila Gustav GAILS, MD;  Location: [facility] ENDOSCOPY;  Service: Endoscopy;  Laterality: N/A;   ESOPHAGOGASTRODUODENOSCOPY (EGD) WITH PROPOFOL  N/A 09/27/2018   Procedure: ESOPHAGOGASTRODUODENOSCOPY (EGD) WITH PROPOFOL ;  Surgeon: Rollin Dover, MD;  Location: [facility] ENDOSCOPY;  Service: Endoscopy;  Laterality: N/A;   FOREIGN BODY REMOVAL  09/27/2018   Procedure: FOREIGN BODY REMOVAL;  Surgeon: Rollin Dover, MD;  Location: [facility] ENDOSCOPY;  Service: Endoscopy;;   FOREIGN BODY REMOVAL  02/12/2019   Procedure: FOREIGN BODY REMOVAL;  Surgeon: Charlanne Groom, MD;  Location: [facility] ENDOSCOPY;  Service: Endoscopy;;   HERNIA REPAIR  05/14/2013   INSERTION OF MESH N/A 05/14/2013   Procedure: INSERTION OF MESH;  Surgeon: Elspeth KYM Schultze, MD;  Location: [facility] OR;  Service: General;  Laterality: N/A;   LEFT HEART CATH N/A 10/10/2022   Procedure: Left Heart Cath;  Surgeon: Dann Candyce RAMAN, MD;  Location: [facility] INVASIVE CV LAB;  Service: Cardiovascular;  Laterality: N/A;   LEFT HEART CATH AND CORONARY ANGIOGRAPHY N/A 07/17/2021   Procedure: LEFT HEART CATH AND CORONARY ANGIOGRAPHY;  Surgeon: Burnard Debby LABOR, MD;  Location: [facility] INVASIVE CV LAB;  Service: Cardiovascular;  Laterality: N/A;   LEFT HEART CATH AND CORONARY ANGIOGRAPHY N/A 09/18/2022   Procedure: LEFT HEART CATH AND CORONARY ANGIOGRAPHY;  Surgeon: Dann Candyce RAMAN, MD;  Location: [facility] INVASIVE CV LAB;  Service: Cardiovascular;  Laterality: N/A;   LOWER EXTREMITY ANGIOGRAM Bilateral 10/28/2011   Procedure: LOWER EXTREMITY ANGIOGRAM;  Surgeon: Bruckner RAMAN Blade, MD;  Location: [facility] CATH LAB;  Service: Cardiovascular;  Laterality: Bilateral;   LOWER EXTREMITY ANGIOGRAM Right 11/19/2022   Procedure: RIGHT LOWER EXTREMITY ANGIOGRAM;  Surgeon: Sheree Penne Bruckner, MD;  Location: [facility] OR;  Service: Vascular;  Laterality: Right;   LOWER EXTREMITY ANGIOGRAPHY  05/27/2022   Procedure: Lower Extremity Angiography;   Surgeon: Sheree Penne Bruckner, MD;  Location: [facility] INVASIVE CV LAB;  Service: Cardiovascular;;   PACEMAKER INSERTION  07/16/2012   pacemaker/defibrilator   PATCH ANGIOPLASTY Right 11/19/2022   Procedure: VEIN PATCH ANGIOPLASTY;  Surgeon: Sheree Penne Bruckner, MD;  Location: [facility] OR;  Service: Vascular;  Laterality: Right;   POLYPECTOMY     POSTERIOR CERVICAL FUSION/FORAMINOTOMY N/A 06/09/2014   Procedure: Laminectomy - Cervical two-Cervcial four posterior cervical instrumented fusion Cervical two-cervical four;  Surgeon: Alm RAMAN Molt, MD;  Location: [facility] NEURO ORS;  Service: Neurosurgery;  Laterality: N/A;  posterior    RIGHT/LEFT HEART CATH AND CORONARY ANGIOGRAPHY N/A 10/10/2023   Procedure: RIGHT/LEFT HEART CATH AND CORONARY ANGIOGRAPHY;  Surgeon: Elmira Newman PARAS, MD;  Location: [facility] INVASIVE CV LAB;  Service: Cardiovascular;  Laterality: N/A;   SAVORY DILATION N/A 02/05/2018   Procedure: SAVORY DILATION;  Surgeon: Shila Gustav GAILS, MD;  Location: [facility] ENDOSCOPY;  Service: Endoscopy;  Laterality: N/A;   TONSILLECTOMY     as a child    UMBILICAL HERNIA REPAIR N/A 05/14/2013   Procedure: LAPAROSCOPIC exploration and repair of hernia in abdominal ;  Surgeon: Elspeth KYM Schultze, MD;  Location: [facility] OR;  Service: General;  Laterality: N/A;   UPPER GASTROINTESTINAL ENDOSCOPY  2020  Allergies  Allergen Reactions   Aldactone  [Spironolactone ] Other (See Comments)    Hyperkalemia   Brilinta  [Ticagrelor ] Shortness Of Breath    Numbness in hands and feet Blurry vision   Clopidogrel  Rash    Redness and Itchiness   Codeine Rash and Hives   Amoxicillin  Hives   Atorvastatin  Other (See Comments)    Myalgias with lipitor.  Does tolerate simvastatin .     Benadryl  [Diphenhydramine ] Hives   Januvia  [Sitagliptin ] Other (See Comments)    Diarrhea and heart racing   Jardiance  [Empagliflozin ] Other (See Comments)    Polyuria; excessive weight loss   Lisinopril  Other (See Comments)    Possible  cause of pancreatitis   Rosuvastatin  Other (See Comments)    myalgia   Lasix  [Furosemide ] Rash    Prior to Admission medications   Medication Sig Start Date End Date Taking? Authorizing Provider  carvedilol  (COREG ) 25 MG tablet TAKE 1 TABLET BY MOUTH TWICE DAILY WITH MEALS 02/28/23  Yes Jeffrie Oneil BROCKS, MD  Cholecalciferol  (VITAMIN D3) 50 MCG (2000 UT) TABS Take 2,000 Units by mouth in the morning.   Yes [provider]  ciprofloxacin  (CIPRO ) 500 MG tablet Take 1 tablet (500 mg total) by mouth 2 (two) times daily. 02/04/24  Yes Gershon Donnice SAUNDERS, DPM  Cyanocobalamin  (VITAMIN B-12 PO) Take 2,000 mcg by mouth in the morning.   Yes [provider]  doxycycline  (VIBRA -TABS) 100 MG tablet Take 1 tablet (100 mg total) by mouth 2 (two) times daily. 02/04/24  Yes Gershon Donnice SAUNDERS, DPM  glipiZIDE  (GLUCOTROL ) 5 MG tablet Take 2 tablets (10 mg total) by mouth daily. With supper. Patient taking differently: Take 10 mg by mouth every evening. 10/06/23  Yes Cleatus Arlyss RAMAN, MD  insulin  glargine (LANTUS  SOLOSTAR) 100 UNIT/ML Solostar Pen INJECT 0.3 TO 0.35 MLS(30 TO 35 UNITS) INTO THE SKIN EVERY DAY Patient taking differently: Inject 30 Units into the skin at bedtime. 03/29/20  Yes Cleatus Arlyss RAMAN, MD  metFORMIN  (GLUCOPHAGE ) 500 MG tablet TAKE 2 TABLETS BY MOUTH TWICE DAILY WITH FOOD 04/29/23  Yes Cleatus Arlyss RAMAN, MD  Multiple Vitamin (MULTIVITAMIN WITH MINERALS) TABS tablet Take 1 tablet by mouth in the morning.   Yes [provider]  omeprazole  (PRILOSEC) 40 MG capsule TAKE 1 CAPSULE(40 MG) BY MOUTH DAILY Patient taking differently: Take 40 mg by mouth every evening. 08/14/23  Yes Nandigam, Kavitha V, MD  prasugrel  (EFFIENT ) 10 MG TABS tablet Take 0.5 tablets (5 mg total) by mouth in the morning and at bedtime. 10/06/23  Yes Cleatus Arlyss RAMAN, MD  simvastatin  (ZOCOR ) 20 MG tablet TAKE 1 TABLET(20 MG) BY MOUTH AT BEDTIME 10/13/23  Yes Walker, Caitlin S, NP  torsemide  (DEMADEX ) 20 MG  tablet TAKE 1 TABLET(20 MG) BY MOUTH DAILY 11/25/23  Yes Jeffrie Oneil BROCKS, MD  Treprostinil (TYVASO DPI MAINTENANCE KIT) 64 MCG POWD Inhale 1 puff into the lungs in the morning, at noon, in the evening, and at bedtime.   Yes [provider]  triamcinolone  ointment (KENALOG ) 0.1 % Apply topically 2 (two) times daily. Patient taking differently: Apply 1 Application topically daily as needed (skin issues.). 12/04/23  Yes Iva Marty Saltness, MD  valsartan  (DIOVAN ) 40 MG tablet Take 1 tablet (40 mg total) by mouth daily with supper. 10/08/23  Yes Jeffrie Oneil BROCKS, MD  aspirin  EC 81 MG tablet Take 1 tablet (81 mg total) by mouth daily. Patient not taking: Reported on 02/06/2024 09/18/22   Darryle Currier L, PA-C  glucose blood (  ACCU-CHEK AVIVA PLUS) test strip USE AS DIRECTED TO TEST BLOOD SUGAR TWICE DAILY 04/15/23   Cleatus Arlyss RAMAN, MD  sacubitril-valsartan  (ENTRESTO ) 49-51 MG Take 1 tablet by mouth 2 (two) times daily. Patient not taking: Reported on 02/06/2024    [provider]  silver  sulfADIAZINE  (SILVADENE ) 1 % cream Apply 1 Application topically daily. Patient not taking: Reported on 02/06/2024 10/14/23   Iva Marty Saltness, MD  umeclidinium bromide  (INCRUSE ELLIPTA ) 62.5 MCG/ACT AEPB Inhale 1 puff into the lungs daily. Patient not taking: Reported on 02/06/2024 09/06/22   Geronimo Amel, MD    Social History   Socioeconomic History   Marital status: Married    Spouse name: Willy   Number of children: 1   Years of education: Not on file   Highest education level: Bachelor's degree (e.g., BA, AB, BS)  Occupational History   Occupation: retired  Tobacco Use   Smoking status: Former    Current packs/day: 0.00    Average packs/day: 2.0 packs/day for 42.0 years (84.0 ttl pk-yrs)    Types: Cigarettes    Start date: 1964    Quit date: 05/06/2004    Years since quitting: 19.7    Passive exposure: Never   Smokeless tobacco: Never  Vaping Use   Vaping status: Never Used   Substance and Sexual Activity   Alcohol use: Not Currently   Drug use: No   Sexual activity: Yes    Partners: Female  Other Topics Concern   Not on file  Social History Narrative   [place] vet, he has known service related agent orange exposure.    Retired   Community education officer daily.     Social Drivers of Corporate investment banker Strain: Low Risk  (10/02/2023)   Overall Financial Resource Strain (CARDIA)    Difficulty of Paying Living Expenses: Not hard at all  Food Insecurity: No Food Insecurity (10/02/2023)   Hunger Vital Sign    Worried About Running Out of Food in the Last Year: Never true    Ran Out of Food in the Last Year: Never true  Transportation Needs: No Transportation Needs (10/02/2023)   PRAPARE - Administrator, Civil Service (Medical): No    Lack of Transportation (Non-Medical): No  Physical Activity: Sufficiently Active (10/02/2023)   Exercise Vital Sign    Days of Exercise per Week: 5 days    Minutes of Exercise per Session: 30 min  Stress: No Stress Concern Present (10/02/2023)   [employer] of Occupational Health - Occupational Stress Questionnaire    Feeling of Stress : Not at all  Social Connections: Unknown (10/02/2023)   Social Connection and Isolation Panel    Frequency of Communication with Friends and Family: Once a week    Frequency of Social Gatherings with Friends and Family: Once a week    Attends Religious Services: Patient declined    Database administrator or Organizations: Yes    Attends Banker Meetings: 1 to 4 times per year    Marital Status: Married  Catering manager Violence: Not At Risk (04/24/2023)   Humiliation, Afraid, Rape, and Kick questionnaire    Fear of Current or Ex-Partner: No    Emotionally Abused: No    Physically Abused: No    Sexually Abused: No     Family History  Problem Relation Age of Onset   Hypertension Mother    Stroke Mother    Hyperlipidemia Mother    Lung cancer Father  Diabetes Sister    Heart disease Sister        Before age 52   Hypertension Sister    Hyperlipidemia Sister    Heart attack Sister    Hypertension Son    Colon cancer Neg Hx    Prostate cancer Neg Hx    Esophageal cancer Neg Hx    Stomach cancer Neg Hx    Rectal cancer Neg Hx    Colon polyps Neg Hx    Pancreatic cancer Neg Hx     Review of Systems  Constitutional: Negative.   HENT: Negative.    Respiratory:  Positive for shortness of breath.   Musculoskeletal: Negative.        Infection R foot  Skin: Negative.   Neurological: Negative.   Endo/Heme/Allergies: Negative.   Psychiatric/Behavioral: Negative.        Physical Examination  Vitals:   02/06/24 1059 02/06/24 1300  BP: 132/77 127/67  Pulse: 82 76  Resp: 18 (!) 21  Temp: 98.2 F (36.8 C)   SpO2: 96% 96%   There is no height or weight on file to calculate BMI.  Physical Exam HENT:     Head: Normocephalic.     Mouth/Throat:     Mouth: Mucous membranes are moist.  Cardiovascular:     Pulses:          Femoral pulses are 2+ on the right side.      Popliteal pulses are 0 on the right side.       Dorsalis pedis pulses are 0 on the right side.       Posterior tibial pulses are 0 on the right side.  Pulmonary:     Effort: Pulmonary effort is normal.  Abdominal:     General: Abdomen is flat.  Musculoskeletal:        General: Normal range of motion.     Right lower leg: No edema.     Left lower leg: No edema.     Comments: Skin of the right lower extremity appears normal  Skin:    General: Skin is warm.     Capillary Refill: Capillary refill takes 2 to 3 seconds.  Neurological:     General: No focal deficit present.     Mental Status: He is alert.      CBC    Component Value Date/Time   WBC 10.8 (H) 02/06/2024 1121   RBC 3.54 (L) 02/06/2024 1121   HGB 11.8 (L) 02/06/2024 1121   HGB 12.5 (L) 04/24/2023 1406   HCT 36.4 (L) 02/06/2024 1121   HCT 38.1 04/24/2023 1406   PLT 355 02/06/2024 1121    PLT 334 04/24/2023 1406   MCV 102.8 (H) 02/06/2024 1121   MCV 99 (H) 04/24/2023 1406   MCH 33.3 02/06/2024 1121   MCHC 32.4 02/06/2024 1121   RDW 12.3 02/06/2024 1121   RDW 12.4 04/24/2023 1406   LYMPHSABS 0.6 (L) 02/06/2024 1121   LYMPHSABS 1.3 04/24/2023 1406   MONOABS 0.5 02/06/2024 1121   EOSABS 0.1 02/06/2024 1121   EOSABS 0.2 04/24/2023 1406   BASOSABS 0.1 02/06/2024 1121   BASOSABS 0.1 04/24/2023 1406    BMET    Component Value Date/Time   NA 137 02/06/2024 1121   NA 139 04/24/2023 1406   K 4.3 02/06/2024 1121   CL 101 02/06/2024 1121   CO2 20 (L) 02/06/2024 1121   GLUCOSE 253 (H) 02/06/2024 1121   BUN 24 (H) 02/06/2024 1121   BUN 37 (H)  04/24/2023 1406   CREATININE 1.49 (H) 02/06/2024 1121   CREATININE 1.40 (H) 11/08/2022 1450   CALCIUM  9.2 02/06/2024 1121   GFRNONAA 48 (L) 02/06/2024 1121   GFRAA >60 02/12/2019 0956    COAGS: Lab Results  Component Value Date   INR 1.2 08/30/2022   INR 1.06 05/31/2014   INR 0.95 08/23/2013     Non-Invasive Vascular Imaging:   CTA IMPRESSION: VASCULAR   1. Severe right worse than left femoropopliteal disease as detailed above. 2. Partially imaged endovascular aortic repair with a bifurcated endovascular prosthesis. The iliac limbs are widely patent and there is no significant inflow disease. 3. Patent 3 vessel runoff to the ankle right. 4. Short segment occlusion of the left anterior tibial artery with patent 2 vessel runoff to the ankle. 5. Mild ectasias a of the right common femoral artery likely at the site of prior endarterectomy. Maximal vessel diameter 1.4 cm.  ASSESSMENT/PLAN: This is a 78 y.o. male with concern for osteomyelitis of the right foot.  Grossly the foot appears mostly normal however we have planned for bypass in the past he was unable to have a bypass due to unhealthy skin.  Plan will be for angiography from a left common femoral approach and right foot approach possibly will need antegrade access  to the right femoral.  This was discussed with the patient he demonstrates good understanding and will be n.p.o. past midnight Sunday.  Breannah Kratt C. Sheree, MD [facility] Office: [phone] Pager: [phone]

## 2024-02-06 NOTE — Consult Note (Signed)
 PODIATRY CONSULTATION  NAME DONTERRIUS SANTUCCI MRN [id number] DOB [birth year] DOA 02/06/2024   Reason for consult:  Chief Complaint  Patient presents with   Fatigue    Attending/Consulting physician: Dr. Ula  History of present illness: 78 y.o. male with history of coronary artery disease idiopathic pulmonary fibrosis, PAD, heart failure, ICD and DM2.  Presenting with right foot small superficial ulceration concern for osteomyelitis on CT obtained per Dr. Gershon.  Patient states he has history of right foot ulcerations 1 of which healed underneath the great toe joint previously.  This was quite a while ago not recent and has been fully healed.  He reports that he recently had increasing redness and swelling in the 3rd and 4th toes on the right foot.  He therefore came in for appointment earlier this week on Wednesday as he felt he needed an antibiotic to help with possible infection in the toe.  An x-ray was taken and a CT was ordered at the time and he went for a CT which was resulted this morning and there was concern for osteomyelitis so he was directed to go to the emergency department.  Has a history of vascular disease and has seen vascular surgery in the past most recently in July 2025.  Has had intervention including right CFA and endarterectomy on November 19, 2022 with Dr. Sheree.  Last ABI/TBI on 10/22/2023 with a TBI of 0.21 on the right.  Great toe pressure 33.  Past Medical History:  Diagnosis Date   AAA (abdominal aortic aneurysm) 2011   Per vascular surgery   Angio-edema    Atrial fibrillation (HCC)    CAD (coronary artery disease)    Presumed CAD with nuclear scan October 09, 2011,  large anteroseptal MI and inferior MI. Catheterization scheduled October 15, 2011   Cardiomyopathy [facility])    Nuclear, October 09, 2011, EF 30%, multiple focal wall motion abnormalities   Chronic kidney disease    CKD3   COVID-19    2021   Diabetes mellitus    type II   GERD (gastroesophageal reflux disease)     Silent   HLD (hyperlipidemia)    Hypertension    white coat HTN-- often elevated in office and controlled on outside checks.   ICD (implantable cardioverter-defibrillator) in place    CRT-D placed March, 2014 complete heart block and k dysfunction   IPF (idiopathic pulmonary fibrosis) (HCC)    LBBB (left bundle branch block)    LBBB on EKG October 11, 2011,  no prior EKG has been done   Low testosterone    Hx of   On home oxygen  therapy    2L as needed   Pacemaker    PAD (peripheral artery disease)    Pancreatitis    Peripheral arterial disease    Umbilical hernia        Latest Ref Rng & Units 02/06/2024   11:21 AM 10/10/2023   10:40 AM 10/10/2023   10:33 AM  CBC  WBC 4.0 - 10.5 K/uL 10.8     Hemoglobin 13.0 - 17.0 g/dL 88.1  88.0  88.0   Hematocrit 39.0 - 52.0 % 36.4  35.0  35.0   Platelets 150 - 400 K/uL 355          Latest Ref Rng & Units 02/06/2024   11:21 AM 10/10/2023   10:40 AM 10/10/2023   10:33 AM  BMP  Glucose 70 - 99 mg/dL 746     BUN 8 - 23  mg/dL 24     Creatinine 9.38 - 1.24 mg/dL 8.50     Sodium 864 - 854 mmol/L 137  139  139   Potassium 3.5 - 5.1 mmol/L 4.3  4.1  4.2   Chloride 98 - 111 mmol/L 101     CO2 22 - 32 mmol/L 20     Calcium  8.9 - 10.3 mg/dL 9.2         Physical Exam: Lower Extremity Exam  DP and PT pulses nonpalpable  Small superficial ulceration of the distal aspect of the fourth toe on the right foot.  Does have some erythema and edema of the 3rd and 4th toes.  Sensation significantly diminished to light touch secondary to neuropathy  Deformity of the hallux with shortening of the hallux in comparison relative the second toe which is elongated significantly compared to the remaining digits.  This is related to the bone loss within the 1st, 3rd and 4th toes        ASSESSMENT/PLAN OF CARE 78 y.o. male with PMHx significant for  coronary artery disease idiopathic pulmonary fibrosis, PAD, heart failure, ICD and DM2 with concern for  possible chronic multifocal osteomyelitis seen on CT including the first metatarsal head first proximal phalanx, phalanges of the 3rd and 4th toes and the fourth metatarsal head.  WBC 10.8 ESR/CRP: Pending CT right foot without contrast: 1. Active osteomyelitis involving the head of the 1st metatarsal, base of the proximal phalanx of the great toe, phalanges of the third and fourth toes, and head of the fourth metatarsal. 2. Oblique fracture through the distal metaphysis of the 4th metatarsal. 3. Possible sesamoiditis or osteomyelitis of the 1st digit sesamoids.  -Discussed with patient we will proceed with vascular workup as well as evaluate lab work.  Discussed that he may need amputation of a toe or the forefoot this admission.  When I stated this he stated his hesitance on any surgery given his heart and lung conditions.  - Did discuss that his foot overall appears stable and I suspect many of the changes seen on the CT represent chronic longstanding changes whether due to osteomyelitis or otherwise.  Clinically his foot does not have the appearance of having acute osteomyelitis in the first ray though slightly more concerning in the 3rd and 4th toes -Will have Dr. Tobie follow-up over the weekend to further discuss with the patient findings and plan whether that be IV antibiotic therapy or surgical intervention possibly TMA -ESR and CRP ordered - Continue IV abx broad spectrum pending further culture data - Anticoagulation: Okay to continue per primary/vascular - Wound care: Betadine paint to the fourth toe right foot - WB status: Weightbearing as tolerated in postop shoe - Will continue to follow   Thank you for the consult.  Please contact me directly with any questions or concerns.           Marolyn JULIANNA Honour, DPM [facility]    [address] [place], [place] [address]                Office [phone]  Fax [phone]

## 2024-02-06 NOTE — ED Triage Notes (Signed)
 Right foot wound and possible infection with fatigue. A&Ox4. Endorsing Diarrhea. Denies pain.

## 2024-02-07 ENCOUNTER — Inpatient Hospital Stay (HOSPITAL_COMMUNITY)

## 2024-02-07 DIAGNOSIS — M86171 Other acute osteomyelitis, right ankle and foot: Secondary | ICD-10-CM | POA: Diagnosis not present

## 2024-02-07 DIAGNOSIS — M869 Osteomyelitis, unspecified: Secondary | ICD-10-CM | POA: Diagnosis not present

## 2024-02-07 DIAGNOSIS — I361 Nonrheumatic tricuspid (valve) insufficiency: Secondary | ICD-10-CM

## 2024-02-07 LAB — PHOSPHORUS: Phosphorus: 3.3 mg/dL (ref 2.5–4.6)

## 2024-02-07 LAB — COMPREHENSIVE METABOLIC PANEL WITH GFR
ALT: 10 U/L (ref 0–44)
AST: 15 U/L (ref 15–41)
Albumin: 3 g/dL — ABNORMAL LOW (ref 3.5–5.0)
Alkaline Phosphatase: 63 U/L (ref 38–126)
Anion gap: 15 (ref 5–15)
BUN: 17 mg/dL (ref 8–23)
CO2: 24 mmol/L (ref 22–32)
Calcium: 9.5 mg/dL (ref 8.9–10.3)
Chloride: 103 mmol/L (ref 98–111)
Creatinine, Ser: 1.16 mg/dL (ref 0.61–1.24)
GFR, Estimated: >60 mL/min (ref >60)
Glucose, Bld: 133 mg/dL — ABNORMAL HIGH (ref 70–99)
Potassium: 3.9 mmol/L (ref 3.5–5.1)
Sodium: 142 mmol/L (ref 135–145)
Total Bilirubin: 1 mg/dL (ref 0.0–1.2)
Total Protein: 7.6 g/dL (ref 6.5–8.1)

## 2024-02-07 LAB — CBC WITH DIFFERENTIAL/PLATELET
Abs Immature Granulocytes: 0.06 K/uL (ref 0.00–0.07)
Basophils Absolute: 0.1 K/uL (ref 0.0–0.1)
Basophils Relative: 1 %
Eosinophils Absolute: 0.2 K/uL (ref 0.0–0.5)
Eosinophils Relative: 2 %
HCT: 36.1 % — ABNORMAL LOW (ref 39.0–52.0)
Hemoglobin: 12.4 g/dL — ABNORMAL LOW (ref 13.0–17.0)
Immature Granulocytes: 1 %
Lymphocytes Relative: 7 %
Lymphs Abs: 0.8 K/uL (ref 0.7–4.0)
MCH: 33.4 pg (ref 26.0–34.0)
MCHC: 34.3 g/dL (ref 30.0–36.0)
MCV: 97.3 fL (ref 80.0–100.0)
Monocytes Absolute: 0.6 K/uL (ref 0.1–1.0)
Monocytes Relative: 6 %
Neutro Abs: 9.6 K/uL — ABNORMAL HIGH (ref 1.7–7.7)
Neutrophils Relative %: 83 %
Platelets: 416 K/uL — ABNORMAL HIGH (ref 150–400)
RBC: 3.71 MIL/uL — ABNORMAL LOW (ref 4.22–5.81)
RDW: 12.1 % (ref 11.5–15.5)
WBC: 11.4 K/uL — ABNORMAL HIGH (ref 4.0–10.5)
nRBC: 0 % (ref 0.0–0.2)

## 2024-02-07 LAB — ECHOCARDIOGRAM COMPLETE
AR max vel: 2.08 cm2
AV Peak grad: 8.5 mmHg
Ao pk vel: 1.46 m/s
Area-P 1/2: 5.06 cm2
Calc EF: 50.3 %
Height: 73 in
S' Lateral: 3.8 cm
Single Plane A2C EF: 46.6 %
Single Plane A4C EF: 51.2 %

## 2024-02-07 LAB — GLUCOSE, CAPILLARY
Glucose-Capillary: 140 mg/dL — ABNORMAL HIGH (ref 70–99)
Glucose-Capillary: 97 mg/dL (ref 70–99)
Glucose-Capillary: 151 mg/dL — ABNORMAL HIGH (ref 70–99)
Glucose-Capillary: 192 mg/dL — ABNORMAL HIGH (ref 70–99)
Glucose-Capillary: 250 mg/dL — ABNORMAL HIGH (ref 70–99)

## 2024-02-07 LAB — PROTIME-INR
INR: 1.2 (ref 0.8–1.2)
Prothrombin Time: 16.3 s — ABNORMAL HIGH (ref 11.4–15.2)

## 2024-02-07 LAB — CORTISOL-AM, BLOOD: Cortisol - AM: 18.1 ug/dL (ref 6.7–22.6)

## 2024-02-07 LAB — MAGNESIUM: Magnesium: 1.1 mg/dL — ABNORMAL LOW (ref 1.7–2.4)

## 2024-02-07 MED ORDER — INSULIN ASPART 100 UNIT/ML IJ SOLN
0.0000 [IU] | Freq: Every day | INTRAMUSCULAR | Status: DC
  Administered 2024-02-07 – 2024-02-08 (×2): 2 [IU] via SUBCUTANEOUS

## 2024-02-07 MED ORDER — MAGNESIUM SULFATE 4 GM/100ML IV SOLN
4.0000 g | Freq: Once | INTRAVENOUS | Status: AC
  Administered 2024-02-07: 4 g via INTRAVENOUS
  Filled 2024-02-07: qty 100

## 2024-02-07 MED ORDER — INSULIN ASPART 100 UNIT/ML IJ SOLN
0.0000 [IU] | Freq: Three times a day (TID) | INTRAMUSCULAR | Status: DC
  Administered 2024-02-08: 5 [IU] via SUBCUTANEOUS
  Administered 2024-02-08: 8 [IU] via SUBCUTANEOUS
  Administered 2024-02-08: 3 [IU] via SUBCUTANEOUS
  Administered 2024-02-09: 11 [IU] via SUBCUTANEOUS
  Administered 2024-02-09 – 2024-02-10 (×2): 5 [IU] via SUBCUTANEOUS
  Administered 2024-02-10: 3 [IU] via SUBCUTANEOUS

## 2024-02-07 NOTE — Progress Notes (Signed)
 PROGRESS NOTE    Luke [name]  FMW:[id number] DOB: 13-Oct-1945 DOA: 02/06/2024 PCP: Cleatus Arlyss RAMAN, MD  Outpatient Specialists:     Brief Narrative:  Patient is a 78 year old male with past medical history significant for abdominal aortic aneurysm, atrial fibrillation, chronic artery disease, cardiomyopathy, chronic kidney disease, type 2 diabetes mellitus, hypertension, hyperlipidemia, status post ICD placement, idiopathic pulmonary fibrosis and peripheral artery disease.  Patient was admitted with osteomyelitis of the right foot.  02/07/2024: Patient seen alongside patient's wife.  No new complaints.  Patient is on IV vancomycin, cefepime and Flagyl.  Vascular surgery input is appreciated.  For possible vascular surgery procedure on Monday.  Input from podiatry team is also appreciated.   Assessment & Plan:   Principal Problem:   Acute osteomyelitis of right foot (HCC) Active Problems:   Chronic multifocal osteomyelitis of right foot (HCC)   Right foot osteomyelitis: - CT right foot revealed osteomyelitis involving head of the first metatarsal, base of the proximal phalanx of the great toe, phalanges of the 3rd and 4th toes, and head of the fourth metatarsal. - Continue IV antibiotics. - Vascular surgery team and podiatry team have been consulted. - Vascular surgery procedure on Monday, 02/09/2024. - For possible amputation of the great toe versus the forefoot as per podiatry team.  Type 2 diabetes mellitus: - Continue to optimize. - Monitor blood sugar.  Acute kidney injury: - Resolved. - Serum creatinine of 1.16 today.  On presentation, serum creatinine was 1.49. - Continue to avoid nephrotoxins. - Keep MAP greater than 65 mmHg.  Hypomagnesemia: - Magnesium  of 1.1. - IV magnesium  4 g x 1 dose. - Continue to monitor renal function, electrolytes and magnesium .  Persistent leukocytosis: - WBC of 11.4. - Continue to monitor closely.  Peripheral artery disease: -  Management as per vascular surgery team.  Hypertension: - Controlled. - Blood pressure 127/76.  Cardiomyopathy: - Compensated.   DVT prophylaxis:  Code Status: Full code Family Communication: Wife by bedside Disposition Plan: Inpatient   Consultants:  Vascular surgery team. Podiatry  Procedures/imaging studies:  CT right foot and ankle, without IV contrast revealed: 1. Active osteomyelitis involving the head of the 1st metatarsal, base of the proximal phalanx of the great toe, phalanges of the third and fourth toes, and head of the fourth metatarsal. 2. Oblique fracture through the distal metaphysis of the 4th metatarsal. 3. Possible sesamoiditis or osteomyelitis of the 1st digit sesamoids. 4. Circumferential ankle and dorsal forefoot subcutaneous edema. 5. Large plantar and Achilles calcaneal spurs, posterior subtalar spur, and mild dorsal midfoot spurring. 6. Small well-corticated ossicles below the medial malleolus.  Antimicrobials:  IV vancomycin IV cefepime IV Flagyl   Subjective: No new complaint  Objective: Vitals:   02/06/24 1939 02/07/24 0451 02/07/24 0751 02/07/24 0819  BP:  (!) 141/78  (!) 145/96  Pulse: 87 88  84  Resp: 19 15  19   Temp:  98.2 F (36.8 C)  98.3 F (36.8 C)  TempSrc:  Oral    SpO2: 97% 90% 94% 94%  Height:        Intake/Output Summary (Last 24 hours) at 02/07/2024 1110 Last data filed at 02/06/2024 1613 Gross per 24 hour  Intake 478.22 ml  Output --  Net 478.22 ml   There were no vitals filed for this visit.  Examination:  General exam: Appears calm and comfortable  Respiratory system: Clear to auscultation.  Cardiovascular system: S1 & S2 Gastrointestinal system: Abdomen is soft and nontender.  Central nervous  system: Alert and oriented.  Extremities:  .          Data Reviewed: I have personally reviewed following labs and imaging studies  CBC: Recent Labs  Lab 02/06/24 1121 02/07/24 0930  WBC 10.8* 11.4*   NEUTROABS 9.3* 9.6*  HGB 11.8* 12.4*  HCT 36.4* 36.1*  MCV 102.8* 97.3  PLT 355 416*   Basic Metabolic Panel: Recent Labs  Lab 02/06/24 1121 02/07/24 0930  NA 137 142  K 4.3 3.9  CL 101 103  CO2 20* 24  GLUCOSE 253* 133*  BUN 24* 17  CREATININE 1.49* 1.16  CALCIUM  9.2 9.5  MG  --  1.1*  PHOS  --  3.3   GFR: Estimated Creatinine Clearance: 60.3 mL/min (by C-G formula based on SCr of 1.16 mg/dL). Liver Function Tests: Recent Labs  Lab 02/06/24 1121 02/07/24 0930  AST 17 15  ALT 12 10  ALKPHOS 56 63  BILITOT 1.1 1.0  PROT 7.5 7.6  ALBUMIN  3.1* 3.0*   No results for input(s): LIPASE, AMYLASE in the last 168 hours. No results for input(s): AMMONIA in the last 168 hours. Coagulation Profile: Recent Labs  Lab 02/07/24 0930  INR 1.2   Cardiac Enzymes: No results for input(s): CKTOTAL, CKMB, CKMBINDEX, TROPONINI in the last 168 hours. BNP (last 3 results) No results for input(s): PROBNP in the last 8760 hours. HbA1C: Recent Labs    02/06/24 1817  HGBA1C 7.8*   CBG: Recent Labs  Lab 02/06/24 1829 02/06/24 2336 02/07/24 0441 02/07/24 0820  GLUCAP 219* 211* 140* 97   Lipid Profile: No results for input(s): CHOL, HDL, LDLCALC, TRIG, CHOLHDL, LDLDIRECT in the last 72 hours. Thyroid  Function Tests: No results for input(s): TSH, T4TOTAL, FREET4, T3FREE, THYROIDAB in the last 72 hours. Anemia Panel: No results for input(s): VITAMINB12, FOLATE, FERRITIN, TIBC, IRON, RETICCTPCT in the last 72 hours. Urine analysis:    Component Value Date/Time   COLORURINE YELLOW 11/19/2022 0755   APPEARANCEUR CLEAR 11/19/2022 0755   LABSPEC 1.020 11/19/2022 0755   PHURINE 5.5 11/19/2022 0755   GLUCOSEU NEGATIVE 11/19/2022 0755   HGBUR NEGATIVE 11/19/2022 0755   BILIRUBINUR NEGATIVE 11/19/2022 0755   KETONESUR NEGATIVE 11/19/2022 0755   PROTEINUR NEGATIVE 11/19/2022 0755   UROBILINOGEN 0.2 11/27/2011 1102   NITRITE  NEGATIVE 11/19/2022 0755   LEUKOCYTESUR NEGATIVE 11/19/2022 0755   Sepsis Labs: @LABRCNTIP (procalcitonin:4,lacticidven:4)  ) Recent Results (from the past 240 hours)  Blood Cultures x 2 sites     Status: None (Preliminary result)   Collection Time: 02/06/24 11:20 AM   Specimen: BLOOD  Result Value Ref Range Status   Specimen Description BLOOD SITE NOT SPECIFIED  Final   Special Requests   Final    BOTTLES DRAWN AEROBIC AND ANAEROBIC Blood Culture results may not be optimal due to an inadequate volume of blood received in culture bottles   Culture   Final    NO GROWTH < 24 HOURS Performed at [facility], [address], [place], [place] [address]    Report Status PENDING  Incomplete  Blood Cultures x 2 sites     Status: None (Preliminary result)   Collection Time: 02/06/24 11:20 AM   Specimen: BLOOD  Result Value Ref Range Status   Specimen Description BLOOD SITE NOT SPECIFIED  Final   Special Requests   Final    BOTTLES DRAWN AEROBIC AND ANAEROBIC Blood Culture results may not be optimal due to an inadequate volume of blood received in culture bottles  Culture   Final    NO GROWTH < 24 HOURS Performed at [facility], [address] [place], [place] [address]    Report Status PENDING  Incomplete         Radiology Studies: VAS US  LOWER EXTREMITY VENOUS (DVT) Result Date: 02/06/2024  Lower Venous DVT Study Patient Name:  Luke [name]  Date of Exam:   02/06/2024 Medical Rec #: [id number]         Accession #:    [id number] Date of Birth: 1945-11-03          Patient Gender: M Patient Age:   67 years Exam Location:  [facility] Procedure:      VAS US  LOWER EXTREMITY VENOUS (DVT) Referring Phys: EKTA PATEL --------------------------------------------------------------------------------  Indications: Edema. Other Indications: DM foot infections. Comparison Study: Previous exam on 10/05/2022 was negative for DVT Performing Technologist: Ezzie Potters RVT, RDMS   Examination Guidelines: A complete evaluation includes B-mode imaging, spectral Doppler, color Doppler, and power Doppler as needed of all accessible portions of each vessel. Bilateral testing is considered an integral part of a complete examination. Limited examinations for reoccurring indications may be performed as noted. The reflux portion of the exam is performed with the patient in reverse Trendelenburg.  +---------+---------------+---------+-----------+----------+--------------+ RIGHT    CompressibilityPhasicitySpontaneityPropertiesThrombus Aging +---------+---------------+---------+-----------+----------+--------------+ CFV      Full           Yes      Yes                                 +---------+---------------+---------+-----------+----------+--------------+ SFJ                                                   not visualized +---------+---------------+---------+-----------+----------+--------------+ FV Prox  Full           Yes      Yes                                 +---------+---------------+---------+-----------+----------+--------------+ FV Mid   Full           Yes      Yes                                 +---------+---------------+---------+-----------+----------+--------------+ FV DistalFull           Yes      Yes                                 +---------+---------------+---------+-----------+----------+--------------+ PFV      Full                                                        +---------+---------------+---------+-----------+----------+--------------+ POP      Full           Yes      Yes                                 +---------+---------------+---------+-----------+----------+--------------+  PTV      Full                                                        +---------+---------------+---------+-----------+----------+--------------+ PERO     Full                                                         +---------+---------------+---------+-----------+----------+--------------+   +----+---------------+---------+-----------+----------+--------------+ LEFTCompressibilityPhasicitySpontaneityPropertiesThrombus Aging +----+---------------+---------+-----------+----------+--------------+ CFV Full           Yes      Yes                                 +----+---------------+---------+-----------+----------+--------------+     Summary: RIGHT: - There is no evidence of deep vein thrombosis in the lower extremity.  - No cystic structure found in the popliteal fossa.  LEFT: - No evidence of common femoral vein obstruction.   *See table(s) above for measurements and observations.    Preliminary    DG Chest Portable 1 View Result Date: 02/06/2024 CLINICAL DATA:  Hypoxia. EXAM: PORTABLE CHEST 1 VIEW COMPARISON:  10/29/2022, chest CT 01/20/2023 FINDINGS: Left-sided pacemaker unchanged. Lungs are hypoinflated demonstrate coarse interstitial changes over the mid to lower lungs with possible mild mild slight progression compatible with known pulmonary fibrosis. No definite new airspace process or effusion. Cardiomediastinal silhouette and remainder of the exam is unchanged. IMPRESSION: Hypoinflation with coarse interstitial changes over the mid to lower lungs with possible mild progression compatible with known pulmonary fibrosis. Electronically Signed   By: Toribio Agreste M.D.   On: 02/06/2024 16:43   CT ANGIO LOWER EXT BILAT W &/OR WO CONTRAST Result Date: 02/06/2024 CLINICAL DATA:  Right foot wound, claudication EXAM: CT ANGIOGRAPHY OF ABDOMINAL AORTA WITH ILIOFEMORAL RUNOFF TECHNIQUE: Multidetector CT imaging of the abdomen, pelvis and lower extremities was performed using the standard protocol during bolus administration of intravenous contrast. Multiplanar CT image reconstructions and MIPs were obtained to evaluate the vascular anatomy. RADIATION DOSE REDUCTION: This exam was performed according to the  departmental dose-optimization program which includes automated exposure control, adjustment of the mA and/or kV according to patient size and/or use of iterative reconstruction technique. CONTRAST:  OMNIPAQUE  IOHEXOL  350 MG/ML SOLN COMPARISON:  CT scan the abdomen pelvis 08/21/2021 FINDINGS: VASCULAR Aorta: Partially imaged endovascular aortic graft for both the right and left lobes are visible but the body of the graft is not. Treated aneurysm of the infrarenal abdominal aorta. RIGHT Lower Extremity Inflow: Widely patent. Outflow: Mild aneurysmal dilation of the right common femoral artery likely at the site of prior endarterectomy. The vessel measures up to 1.4 cm in diameter. Profunda femoral artery is widely patent. Critical stenosis of the proximal superficial femoral artery due to either peripheral wall adherent mural thrombus or extensive fibrofatty atherosclerotic plaque. Second tandem high-grade stenosis also present in proximal thigh. Heavily calcified atherosclerotic plaque in the mid thigh results complete occlusion of the SFA. The SFA remains occluded through Hunter's canal before reconstituting at the P1 segment of the popliteal artery. Extensive multifocal calcified plaque results in moderate stenosis of the P1  segment. The popliteal artery then occludes completely at the P2 segment and remains occluded through the P3 segment. Runoff: Reconstitution of the runoff vessels at the origin of the anterior tibial artery. Patent 3 vessel runoff to the ankle. LEFT Lower Extremity Inflow: Common, internal and external iliac arteries are patent without evidence of aneurysm, dissection, vasculitis or significant stenosis. Outflow: Scattered calcified plaque along the common femoral artery with mild luminal narrowing. The profunda femoral artery is widely patent. Bulky calcified plaque results in critical stenosis of the origin of the superficial femoral artery. High-grade stenosis also present within the  SFA in the distal thigh just proximal to Hunter's canal. Focal high-grade stenosis of the proximal P3 segment of the popliteal artery. The artery remains patent. Runoff: Likely segmental occlusion of the anterior tibial artery proximal 2 vessel runoff to the ankle. Veins: No focal venous abnormality. Review of the MIP images confirms the above findings. NON-VASCULAR Adrenals/Urinary Tract: Visualized kidneys, ureter and bladder are all within normal limits. Stomach/Bowel: No focal bowel wall thickening or evidence of obstruction. Normal appendix. Lymphatic: No suspicious lymphadenopathy. Reproductive: Mild prostatomegaly. Other: Fat containing right inguinal hernia.  No ascites. Musculoskeletal: Multilevel degenerative disc disease. No acute fracture or malalignment. No lytic or blastic osseous lesion. IMPRESSION: VASCULAR 1. Severe right worse than left femoropopliteal disease as detailed above. 2. Partially imaged endovascular aortic repair with a bifurcated endovascular prosthesis. The iliac limbs are widely patent and there is no significant inflow disease. 3. Patent 3 vessel runoff to the ankle right. 4. Short segment occlusion of the left anterior tibial artery with patent 2 vessel runoff to the ankle. 5. Mild ectasias a of the right common femoral artery likely at the site of prior endarterectomy. Maximal vessel diameter 1.4 cm. NON-VASCULAR 1. Fat containing right inguinal hernia. 2. Multilevel degenerative disc disease. Electronically Signed   By: Wilkie Lent M.D.   On: 02/06/2024 16:11   CT FOOT RIGHT WO CONTRAST Result Date: 02/05/2024 EXAM: CT RIGHT FOOT AND ANKLE, WITHOUT IV CONTRAST 02/05/2024 04:15:00 PM TECHNIQUE: Axial images were acquired through the right foot and ankle without IV contrast. Reformatted images were reviewed. Automated exposure control, iterative reconstruction, and/or weight based adjustment of the mA/kV was utilized to reduce the radiation dose to as low as reasonably  achievable. COMPARISON: None available. CLINICAL HISTORY: Right foot swelling, suspected osteomyelitis, history of diabetes and PAD, draining wounds, subacute osteomyelitis on prior X-ray. FINDINGS: BONES AND JOINTS: Destructive findings in the head of the 1st metatarsal, base of the proximal phalanx great toe, phalanges of the third toe, phalanges of the fourth toe, and in the head of the fourth metatarsal compatible with active osteomyelitis. Oblique fracture through the distal metaphysis of the 4th metatarsal. Small well corticated ossicles below the medial malleolus. Large plantar and achilles calcaneal spurs. Posterior spur angle in the posterior subtalar joint. Mild dorsal midfoot spurring. Mild scallop angle in the superior margins of the 1st digit sesamoids possibly from sesamoiditis or osteomyelitis. No dislocation. The joint spaces are normal. SOFT TISSUES: Circumferential subcutaneous edema in the ankle. Dorsal subcutaneous edema in the forefoot. IMPRESSION: 1. Active osteomyelitis involving the head of the 1st metatarsal, base of the proximal phalanx of the great toe, phalanges of the third and fourth toes, and head of the fourth metatarsal. 2. Oblique fracture through the distal metaphysis of the 4th metatarsal. 3. Possible sesamoiditis or osteomyelitis of the 1st digit sesamoids. 4. Circumferential ankle and dorsal forefoot subcutaneous edema. 5. Large plantar and Achilles calcaneal spurs,  posterior subtalar spur, and mild dorsal midfoot spurring. 6. Small well-corticated ossicles below the medial malleolus. Electronically signed by: Ryan Salvage MD 02/05/2024 04:30 PM EDT RP Workstation: [id number]        Scheduled Meds:  budesonide  0.5 mg Nebulization BID   insulin  aspart  0-15 Units Subcutaneous Q4H   Treprostinil  64 mcg Inhalation QID   Continuous Infusions:  ceFEPime (MAXIPIME) IV 2 g (02/07/24 1059)   lactated ringers  150 mL/hr (02/06/24 1900)   metronidazole 500 mg  (02/06/24 2304)   vancomycin 1,250 mg (02/07/24 1109)     LOS: 1 day    Time spent: 35 minutes.    Leatrice Chapel, MD  [facility] Pager #: [phone] 7PM-7AM contact night coverage as above

## 2024-02-07 NOTE — Progress Notes (Signed)
 Echocardiogram 2D Echocardiogram has been performed.  Emery Dupuy N Dejanay Wamboldt,RDCS 02/07/2024, 2:03 PM

## 2024-02-07 NOTE — Plan of Care (Signed)

## 2024-02-07 NOTE — Progress Notes (Signed)
  Subjective:  Patient ID: Luke [name], male    DOB: 1945/09/24,  MRN: [id number]  A 77 y.o. male concern for osteomyelitis to the right foot.  Patient had a CT scan done which showed osteomyelitic changes may likely be chronic.  Has a history of peripheral vascular disease he states is doing okay.  Denies any nausea fever chills vomiting the foot feels pretty stable no pain. Objective:   Vitals:   02/07/24 0751 02/07/24 0819  BP:  (!) 145/96  Pulse:  84  Resp:  19  Temp:  98.3 F (36.8 C)  SpO2: 94% 94%   General AA&O x3. Normal mood and affect.  Vascular Dorsalis pedis and posterior tibial pulses nonpalpable bilateral. Sluggish capillary refill to all digits. Pedal hair present.  Neurologic Decreased sensation to light touch secondary to neuropathy  Dermatologic No open lesions. Interspaces clear of maceration. Nails well groomed and normal in appearance.  Orthopedic: Deformity of the hallux with shortening of the hallux in comparison relative the second toe which is elongated significantly compared to the remaining digits.  This is related to the bone loss within the 1st, 3rd and 4th toes      Assessment & Plan:  Patient was evaluated and treated and all questions answered.  Right foot osteomyelitis to multiple sites and digits - All questions and concerns were discussed with the patient in extensive detail. - I once again reviewed the CT scan with the patient which shows that patient may be having osteomyelitic changes to multiple digits including first right 3rd and 4th digit. - I discussed with the patient that he would benefit from transmetatarsal amputation versus long-term IV antibiotics I discussed this with the patient he is leaning more towards long-term IV antibiotics.  Will plan on rediscussing this after the angiogram on Monday if patient continues to want IV antibiotics he will benefit from infectious disease consult for long-term IV antibiotics - He is scheduled to  undergo angiogram with vascular on Monday. - Continue broad-spectrum IV antibiotics - Weightbearing as tolerated in surgical shoe Betadine wet-to-dry dressing changes- -podiatry to continue follow  Franky SHAUNNA Blanch, DPM  Accessible via secure chat for questions or concerns.

## 2024-02-08 DIAGNOSIS — L089 Local infection of the skin and subcutaneous tissue, unspecified: Secondary | ICD-10-CM

## 2024-02-08 DIAGNOSIS — M86171 Other acute osteomyelitis, right ankle and foot: Secondary | ICD-10-CM | POA: Diagnosis not present

## 2024-02-08 LAB — RENAL FUNCTION PANEL
Albumin: 2.7 g/dL — ABNORMAL LOW (ref 3.5–5.0)
Anion gap: 8 (ref 5–15)
BUN: 13 mg/dL (ref 8–23)
CO2: 27 mmol/L (ref 22–32)
Calcium: 9.2 mg/dL (ref 8.9–10.3)
Chloride: 105 mmol/L (ref 98–111)
Creatinine, Ser: 1.13 mg/dL (ref 0.61–1.24)
GFR, Estimated: >60 mL/min (ref >60)
Glucose, Bld: 176 mg/dL — ABNORMAL HIGH (ref 70–99)
Phosphorus: 3.4 mg/dL (ref 2.5–4.6)
Potassium: 3.7 mmol/L (ref 3.5–5.1)
Sodium: 140 mmol/L (ref 135–145)

## 2024-02-08 LAB — BASIC METABOLIC PANEL WITH GFR
Anion gap: 8 (ref 5–15)
BUN: 13 mg/dL (ref 8–23)
CO2: 26 mmol/L (ref 22–32)
Calcium: 9.1 mg/dL (ref 8.9–10.3)
Chloride: 105 mmol/L (ref 98–111)
Creatinine, Ser: 1.07 mg/dL (ref 0.61–1.24)
GFR, Estimated: >60 mL/min (ref >60)
Glucose, Bld: 176 mg/dL — ABNORMAL HIGH (ref 70–99)
Potassium: 3.7 mmol/L (ref 3.5–5.1)
Sodium: 139 mmol/L (ref 135–145)

## 2024-02-08 LAB — GLUCOSE, CAPILLARY
Glucose-Capillary: 184 mg/dL — ABNORMAL HIGH (ref 70–99)
Glucose-Capillary: 245 mg/dL — ABNORMAL HIGH (ref 70–99)
Glucose-Capillary: 287 mg/dL — ABNORMAL HIGH (ref 70–99)
Glucose-Capillary: 231 mg/dL — ABNORMAL HIGH (ref 70–99)

## 2024-02-08 LAB — MAGNESIUM: Magnesium: 2 mg/dL (ref 1.7–2.4)

## 2024-02-08 MED ORDER — VITAMIN D3 25 MCG (1000 UNIT) PO TABS
2000.0000 [IU] | ORAL_TABLET | Freq: Every morning | ORAL | Status: DC
  Administered 2024-02-10: 2000 [IU] via ORAL
  Filled 2024-02-08 (×4): qty 2

## 2024-02-08 MED ORDER — INFLUENZA VAC SPLIT HIGH-DOSE 0.5 ML IM SUSY
0.5000 mL | PREFILLED_SYRINGE | INTRAMUSCULAR | Status: DC
Start: 2024-02-09 — End: 2024-02-10
  Filled 2024-02-08: qty 0.5

## 2024-02-08 MED ORDER — CARVEDILOL 25 MG PO TABS
25.0000 mg | ORAL_TABLET | Freq: Two times a day (BID) | ORAL | Status: DC
  Administered 2024-02-08 – 2024-02-10 (×4): 25 mg via ORAL
  Filled 2024-02-08 (×4): qty 1

## 2024-02-08 MED ORDER — IRBESARTAN 75 MG PO TABS
37.5000 mg | ORAL_TABLET | Freq: Every day | ORAL | Status: DC
  Administered 2024-02-08 – 2024-02-10 (×2): 37.5 mg via ORAL
  Filled 2024-02-08 (×3): qty 0.5

## 2024-02-08 MED ORDER — VANCOMYCIN HCL 1500 MG/300ML IV SOLN
1500.0000 mg | INTRAVENOUS | Status: DC
  Administered 2024-02-08 – 2024-02-10 (×2): 1500 mg via INTRAVENOUS
  Filled 2024-02-08 (×4): qty 300

## 2024-02-08 MED ORDER — SODIUM CHLORIDE 0.9 % IV SOLN
2.0000 g | Freq: Three times a day (TID) | INTRAVENOUS | Status: DC
  Administered 2024-02-08 – 2024-02-10 (×7): 2 g via INTRAVENOUS
  Filled 2024-02-08 (×7): qty 12.5

## 2024-02-08 MED ORDER — ADULT MULTIVITAMIN W/MINERALS CH
1.0000 | ORAL_TABLET | Freq: Every morning | ORAL | Status: DC
  Administered 2024-02-10: 1 via ORAL
  Filled 2024-02-08 (×2): qty 1

## 2024-02-08 NOTE — Consult Note (Signed)
  Daily Progress Note   Subjective: Ready to proceed with angiogram tomorrow.  Objective: Vitals:   02/08/24 0734 02/08/24 0921  BP: (!) 153/93   Pulse: 90   Resp: 18   Temp: 98 F (36.7 C)   SpO2: 95% 96%    Physical Examination HDS Nonlabored breathing   ASSESSMENT/PLAN:  78 year old male with a right toe wound infection.  Podiatry is involved for his wound and we are planning for right leg angiogram tomorrow.  Risk and benefits were reviewed and he agrees to proceed.  N.p.o. midnight.   Norman GORMAN Serve MD [facility] [phone] 02/08/2024  9:38 AM

## 2024-02-08 NOTE — Progress Notes (Signed)
 Pharmacy Antibiotic Note  Luke [name] is a 79 y.o. male admitted on 02/06/2024 with osteomyelitis.  Pharmacy has been consulted for Vancomycin and Cefepime dosing.  Initial dosing based on creatinine 1.49 > has improved to 1.13. For LE angiogram on 10/6.  May need surgical intervention.   Plan: Cefepime 2gm IV q12hrs to q8hrs. Vancomycin 1250 > 1500 mg IV q24hrs. Estimated AUC 455 using SCr 1.13 Also on Metronidazole 500 mg IV q12hrs per MD Follow renal function, culture data, clinical progress and antibiotic plans.  Height: 6' 1 (185.4 cm) IBW/kg (Calculated) : 79.9  Temp (24hrs), Avg:98.1 F (36.7 C), Min:98 F (36.7 C), Max:98.1 F (36.7 C)  Recent Labs  Lab 02/06/24 1121 02/06/24 1131 02/06/24 1933 02/06/24 2034 02/07/24 0930 02/08/24 0519 02/08/24 0520  WBC 10.8*  --   --   --  11.4*  --   --   CREATININE 1.49*  --   --   --  1.16 1.07 1.13  LATICACIDVEN  --  3.2* 1.5 1.2  --   --   --     Estimated Creatinine Clearance: 61.9 mL/min (by C-G formula based on SCr of 1.13 mg/dL).    Allergies  Allergen Reactions   Aldactone  [Spironolactone ] Other (See Comments)    Hyperkalemia   Brilinta  [Ticagrelor ] Shortness Of Breath    Numbness in hands and feet Blurry vision   Clopidogrel  Rash    Redness and Itchiness   Codeine Rash and Hives   Amoxicillin  Hives   Atorvastatin  Other (See Comments)    Myalgias with lipitor.  Does tolerate simvastatin .     Benadryl  [Diphenhydramine ] Hives   Januvia  [Sitagliptin ] Other (See Comments)    Diarrhea and heart racing   Jardiance  [Empagliflozin ] Other (See Comments)    Polyuria; excessive weight loss   Lisinopril  Other (See Comments)    Possible cause of pancreatitis   Rosuvastatin  Other (See Comments)    myalgia   Lasix  [Furosemide ] Rash    Antimicrobials this admission: Ceftriaxone 10/3 x1 Cefepime 10/3>> Vancomycin 10/3>> Metronidazole IV 10/3 >>  Dose adjustments this admission: 10/5: creatinine improved  1.49>>1.13 - Cefepime and Vanc regimens adjusted  Microbiology results: 10/3 blood: no growth x 2 days to date  Thank you for allowing pharmacy to be a part of this patient's care.  Genaro Zebedee Calin, RPh 02/08/2024 5:50 PM

## 2024-02-08 NOTE — Progress Notes (Signed)
 PROGRESS NOTE    Luke [name]  FMW:[id number] DOB: [birth year] DOA: 02/06/2024 PCP: Cleatus Arlyss RAMAN, MD  Outpatient Specialists:     Brief Narrative:  Patient is a 78 year old male with past medical history significant for abdominal aortic aneurysm, atrial fibrillation, chronic artery disease, cardiomyopathy, chronic kidney disease, type 2 diabetes mellitus, hypertension, hyperlipidemia, status post ICD placement, idiopathic pulmonary fibrosis and peripheral artery disease.  Patient was admitted with osteomyelitis of the right foot.  02/07/2024: Patient seen alongside patient's wife.  No new complaints.  Patient is on IV vancomycin, cefepime and Flagyl.  Vascular surgery input is appreciated.  For possible vascular surgery procedure on Monday.  Input from podiatry team is also appreciated.  02/08/2024, patient seen alongside patient's wife.  No new complaints.  For right lower extremity angiogram tomorrow.  N.p.o. after midnight.  Input from vascular surgery team and podiatry team is highly appreciated.  Assessment & Plan:   Principal Problem:   Acute osteomyelitis of right foot (HCC) Active Problems:   Chronic multifocal osteomyelitis of right foot (HCC)   Osteomyelitis (HCC)   Right foot osteomyelitis: - CT right foot revealed osteomyelitis involving head of the first metatarsal, base of the proximal phalanx of the great toe, phalanges of the 3rd and 4th toes, and head of the fourth metatarsal. - Continue IV antibiotics. - Vascular surgery team and podiatry team have been consulted. - For right lower extremity angiogram tomorrow. - For possible amputation of the great toe versus the forefoot as per podiatry team.  Type 2 diabetes mellitus: - Continue to optimize. - Monitor blood sugar.  Acute kidney injury: - Resolved. - Serum creatinine of 1.16 today.  On presentation, serum creatinine was 1.49. - Continue to avoid nephrotoxins. - Keep MAP greater than 65  mmHg.  Hypomagnesemia: - Magnesium  of 1.1. - IV magnesium  4 g x 1 dose. - Continue to monitor renal function, electrolytes and magnesium . 02/08/2024: Magnesium  level of 2 today.  Persistent leukocytosis: - Last WBC was 11.4. - Continue to monitor closely.  Peripheral artery disease: - Management as per vascular surgery team. - For right lower extremity angiogram tomorrow, 12/30/2023.  Hypertension: - Controlled. - Blood pressure 127/76.  Cardiomyopathy: - Compensated.   DVT prophylaxis:  Code Status: Full code Family Communication: Wife by bedside Disposition Plan: Inpatient   Consultants:  Vascular surgery team. Podiatry  Procedures/imaging studies:  CT right foot and ankle, without IV contrast revealed: 1. Active osteomyelitis involving the head of the 1st metatarsal, base of the proximal phalanx of the great toe, phalanges of the third and fourth toes, and head of the fourth metatarsal. 2. Oblique fracture through the distal metaphysis of the 4th metatarsal. 3. Possible sesamoiditis or osteomyelitis of the 1st digit sesamoids. 4. Circumferential ankle and dorsal forefoot subcutaneous edema. 5. Large plantar and Achilles calcaneal spurs, posterior subtalar spur, and mild dorsal midfoot spurring. 6. Small well-corticated ossicles below the medial malleolus.  Antimicrobials:  IV vancomycin IV cefepime IV Flagyl   Subjective: No new complaint  Objective: Vitals:   02/08/24 0734 02/08/24 0921 02/08/24 1555 02/08/24 1952  BP: (!) 153/93  (!) 143/74 (!) 143/78  Pulse: 90   82  Resp: 18  18   Temp: 98 F (36.7 C)  98.1 F (36.7 C) 97.6 F (36.4 C)  TempSrc: Oral  Oral Oral  SpO2: 95% 96% 95% 96%  Height:        Intake/Output Summary (Last 24 hours) at 02/08/2024 2036 Last data filed at 02/08/2024 1700  Gross per 24 hour  Intake 960 ml  Output 375 ml  Net 585 ml   There were no vitals filed for this visit.  Examination:  General exam: Appears calm  and comfortable  Respiratory system: Clear to auscultation.  Cardiovascular system: S1 & S2 Gastrointestinal system: Abdomen is soft and nontender.  Central nervous system: Alert and oriented.  Extremities:  .          Data Reviewed: I have personally reviewed following labs and imaging studies  CBC: Recent Labs  Lab 02/06/24 1121 02/07/24 0930  WBC 10.8* 11.4*  NEUTROABS 9.3* 9.6*  HGB 11.8* 12.4*  HCT 36.4* 36.1*  MCV 102.8* 97.3  PLT 355 416*   Basic Metabolic Panel: Recent Labs  Lab 02/06/24 1121 02/07/24 0930 02/08/24 0519 02/08/24 0520  NA 137 142 139 140  K 4.3 3.9 3.7 3.7  CL 101 103 105 105  CO2 20* 24 26 27   GLUCOSE 253* 133* 176* 176*  BUN 24* 17 13 13   CREATININE 1.49* 1.16 1.07 1.13  CALCIUM  9.2 9.5 9.1 9.2  MG  --  1.1* 2.0  --   PHOS  --  3.3  --  3.4   GFR: Estimated Creatinine Clearance: 61.9 mL/min (by C-G formula based on SCr of 1.13 mg/dL). Liver Function Tests: Recent Labs  Lab 02/06/24 1121 02/07/24 0930 02/08/24 0520  AST 17 15  --   ALT 12 10  --   ALKPHOS 56 63  --   BILITOT 1.1 1.0  --   PROT 7.5 7.6  --   ALBUMIN  3.1* 3.0* 2.7*   No results for input(s): LIPASE, AMYLASE in the last 168 hours. No results for input(s): AMMONIA in the last 168 hours. Coagulation Profile: Recent Labs  Lab 02/07/24 0930  INR 1.2   Cardiac Enzymes: No results for input(s): CKTOTAL, CKMB, CKMBINDEX, TROPONINI in the last 168 hours. BNP (last 3 results) No results for input(s): PROBNP in the last 8760 hours. HbA1C: Recent Labs    02/06/24 1817  HGBA1C 7.8*   CBG: Recent Labs  Lab 02/07/24 1641 02/07/24 2037 02/08/24 0615 02/08/24 1149 02/08/24 1622  GLUCAP 192* 250* 184* 245* 287*   Lipid Profile: No results for input(s): CHOL, HDL, LDLCALC, TRIG, CHOLHDL, LDLDIRECT in the last 72 hours. Thyroid  Function Tests: No results for input(s): TSH, T4TOTAL, FREET4, T3FREE, THYROIDAB in the last  72 hours. Anemia Panel: No results for input(s): VITAMINB12, FOLATE, FERRITIN, TIBC, IRON, RETICCTPCT in the last 72 hours. Urine analysis:    Component Value Date/Time   COLORURINE YELLOW 11/19/2022 0755   APPEARANCEUR CLEAR 11/19/2022 0755   LABSPEC 1.020 11/19/2022 0755   PHURINE 5.5 11/19/2022 0755   GLUCOSEU NEGATIVE 11/19/2022 0755   HGBUR NEGATIVE 11/19/2022 0755   BILIRUBINUR NEGATIVE 11/19/2022 0755   KETONESUR NEGATIVE 11/19/2022 0755   PROTEINUR NEGATIVE 11/19/2022 0755   UROBILINOGEN 0.2 11/27/2011 1102   NITRITE NEGATIVE 11/19/2022 0755   LEUKOCYTESUR NEGATIVE 11/19/2022 0755   Sepsis Labs: @LABRCNTIP (procalcitonin:4,lacticidven:4)  ) Recent Results (from the past 240 hours)  Blood Cultures x 2 sites     Status: None (Preliminary result)   Collection Time: 02/06/24 11:20 AM   Specimen: BLOOD  Result Value Ref Range Status   Specimen Description BLOOD SITE NOT SPECIFIED  Final   Special Requests   Final    BOTTLES DRAWN AEROBIC AND ANAEROBIC Blood Culture results may not be optimal due to an inadequate volume of blood received in culture bottles   Culture  Final    NO GROWTH 2 DAYS Performed at [facility], [address], [place], [place] [address]    Report Status PENDING  Incomplete  Blood Cultures x 2 sites     Status: None (Preliminary result)   Collection Time: 02/06/24 11:20 AM   Specimen: BLOOD  Result Value Ref Range Status   Specimen Description BLOOD SITE NOT SPECIFIED  Final   Special Requests   Final    BOTTLES DRAWN AEROBIC AND ANAEROBIC Blood Culture results may not be optimal due to an inadequate volume of blood received in culture bottles   Culture   Final    NO GROWTH 2 DAYS Performed at [facility], [address], [place], [place] [address]    Report Status PENDING  Incomplete         Radiology Studies: ECHOCARDIOGRAM COMPLETE Result Date: 02/07/2024    ECHOCARDIOGRAM REPORT   Patient Name:   FERDINAND REVOIR Date of Exam: 02/07/2024 Medical Rec #:  [id number]        Height:       73.0 in Accession #:    [id number]       Weight:       181.0 lb Date of Birth:  1945-07-26         BSA:          2.062 m Patient Age:    78 years         BP:           141/78 mmHg Patient Gender: M                HR:           103 bpm. Exam Location:  Inpatient Procedure: 2D Echo, Color Doppler and Cardiac Doppler (Both Spectral and Color            Flow Doppler were utilized during procedure). Indications:    Sepsis  History:        Patient has prior history of Echocardiogram examinations, most                 recent 10/30/2022. Cardiomyopathy, CAD, PAD and Abdominal Aorta                 Aneurysm, Arrythmias:LBBB; Risk Factors:Hypertension, Diabetes                 and Dyslipidemia. Chronic Kidney Disease.  Sonographer:    Logan Shove RDCS Referring Phys: [phone] EKTA V PATEL IMPRESSIONS  1. Left ventricular ejection fraction, by estimation, is 50 to 55%. The left ventricle has low normal function. Left ventricular endocardial border not optimally defined to evaluate regional wall motion. Left ventricular diastolic function could not be evaluated. There is the interventricular septum is flattened in systole and diastole, consistent with right ventricular pressure and volume overload.  2. Right ventricular systolic function is moderately reduced. The right ventricular size is moderately enlarged. Tricuspid regurgitation signal is inadequate for assessing PA pressure.  3. Right atrial size was mildly dilated.  4. The mitral valve is normal in structure. Trivial mitral valve regurgitation. No evidence of mitral stenosis.  5. Tricuspid valve regurgitation is moderate.  6. The aortic valve has an indeterminant number of cusps. Aortic valve regurgitation is not visualized. No aortic stenosis is present.  7. The inferior vena cava is normal in size with <50% respiratory variability, suggesting right atrial pressure of 8 mmHg. FINDINGS  Left  Ventricle: Left ventricular ejection  fraction, by estimation, is 50 to 55%. The left ventricle has low normal function. Left ventricular endocardial border not optimally defined to evaluate regional wall motion. Strain was performed and the global longitudinal strain is indeterminate. The left ventricular internal cavity size was normal in size. There is no left ventricular hypertrophy. The interventricular septum is flattened in systole and diastole, consistent with right ventricular pressure and  volume overload. Left ventricular diastolic function could not be evaluated due to paced rhythm. Left ventricular diastolic function could not be evaluated. Right Ventricle: The right ventricular size is moderately enlarged. No increase in right ventricular wall thickness. Right ventricular systolic function is moderately reduced. Tricuspid regurgitation signal is inadequate for assessing PA pressure. Left Atrium: Left atrial size was normal in size. Right Atrium: Right atrial size was mildly dilated. Pericardium: There is no evidence of pericardial effusion. Mitral Valve: The mitral valve is normal in structure. Trivial mitral valve regurgitation. No evidence of mitral valve stenosis. Tricuspid Valve: The tricuspid valve is normal in structure. Tricuspid valve regurgitation is moderate . No evidence of tricuspid stenosis. Aortic Valve: The aortic valve has an indeterminant number of cusps. Aortic valve regurgitation is not visualized. No aortic stenosis is present. Aortic valve peak gradient measures 8.5 mmHg. Pulmonic Valve: The pulmonic valve was grossly normal. Pulmonic valve regurgitation is mild. No evidence of pulmonic stenosis. Aorta: The aortic root is normal in size and structure. Venous: The inferior vena cava is normal in size with less than 50% respiratory variability, suggesting right atrial pressure of 8 mmHg. IAS/Shunts: No atrial level shunt detected by color flow Doppler. Additional Comments: 3D was  performed not requiring image post processing on an independent workstation and was indeterminate. A device lead is visualized.  LEFT VENTRICLE PLAX 2D LVIDd:         4.70 cm      Diastology LVIDs:         3.80 cm      LV e' medial:    16.10 cm/s LV PW:         1.00 cm      LV E/e' medial:  7.1 LV IVS:        1.00 cm      LV e' lateral:   20.70 cm/s LVOT diam:     2.20 cm      LV E/e' lateral: 5.5 LVOT Area:     3.80 cm  LV Volumes (MOD) LV vol d, MOD A2C: 140.0 ml LV vol d, MOD A4C: 153.0 ml LV vol s, MOD A2C: 74.7 ml LV vol s, MOD A4C: 74.6 ml LV SV MOD A2C:     65.3 ml LV SV MOD A4C:     153.0 ml LV SV MOD BP:      73.9 ml RIGHT VENTRICLE            IVC RV Basal diam:  4.40 cm    IVC diam: 1.70 cm RV Mid diam:    3.30 cm RV S prime:     9.61 cm/s TAPSE (M-mode): 1.4 cm LEFT ATRIUM             Index        RIGHT ATRIUM           Index LA diam:        3.30 cm 1.60 cm/m   RA Area:     23.30 cm LA Vol (A2C):   42.8 ml 20.75 ml/m  RA Volume:   73.30 ml  35.54 ml/m LA Vol (A4C):   21.8 ml 10.57 ml/m LA Biplane Vol: 30.0 ml 14.55 ml/m  AORTIC VALVE AV Area (Vmax): 2.08 cm AV Vmax:        146.00 cm/s AV Peak Grad:   8.5 mmHg LVOT Vmax:      79.70 cm/s  AORTA Ao Root diam: 3.20 cm Ao Asc diam:  3.30 cm MITRAL VALVE                TRICUSPID VALVE MV Area (PHT): 5.06 cm     TV Peak grad:   49.0 mmHg MV Decel Time: 150 msec     TV Vmax:        3.50 m/s MV E velocity: 114.00 cm/s MV A velocity: 68.20 cm/s   SHUNTS MV E/A ratio:  1.67         Systemic Diam: 2.20 cm Vishnu Priya Mallipeddi Electronically signed by Diannah Late Mallipeddi Signature Date/Time: 02/07/2024/2:17:49 PM    Final         Scheduled Meds:  budesonide  0.5 mg Nebulization BID   carvedilol   25 mg Oral BID WC   [START ON 02/09/2024] cholecalciferol   2,000 Units Oral q AM   [START ON 02/09/2024] Influenza vac split trivalent PF  0.5 mL Intramuscular Tomorrow-1000   insulin  aspart  0-15 Units Subcutaneous TID WC   insulin  aspart  0-5 Units  Subcutaneous QHS   irbesartan   37.5 mg Oral Daily   [START ON 02/09/2024] multivitamin with minerals  1 tablet Oral q AM   Treprostinil  64 mcg Inhalation QID   Continuous Infusions:  ceFEPime (MAXIPIME) IV 2 g (02/08/24 1817)   metronidazole 500 mg (02/08/24 1153)   vancomycin 1,500 mg (02/08/24 1155)     LOS: 2 days    Time spent: 35 minutes.    Leatrice Chapel, MD  [facility] Pager #: [phone] 7PM-7AM contact night coverage as above

## 2024-02-09 ENCOUNTER — Telehealth: Payer: Self-pay | Admitting: Podiatry

## 2024-02-09 ENCOUNTER — Ambulatory Visit: Admitting: Internal Medicine

## 2024-02-09 ENCOUNTER — Inpatient Hospital Stay (HOSPITAL_COMMUNITY)

## 2024-02-09 ENCOUNTER — Inpatient Hospital Stay (HOSPITAL_COMMUNITY)
Admission: EM | Disposition: A | Discharge status: Home / Self Care | Payer: Self-pay | Source: Home / Self Care | Attending: Internal Medicine

## 2024-02-09 ENCOUNTER — Telehealth: Payer: Self-pay

## 2024-02-09 DIAGNOSIS — I7090 Unspecified atherosclerosis: Secondary | ICD-10-CM | POA: Diagnosis not present

## 2024-02-09 DIAGNOSIS — Z95828 Presence of other vascular implants and grafts: Secondary | ICD-10-CM

## 2024-02-09 DIAGNOSIS — S91109A Unspecified open wound of unspecified toe(s) without damage to nail, initial encounter: Secondary | ICD-10-CM

## 2024-02-09 DIAGNOSIS — M86171 Other acute osteomyelitis, right ankle and foot: Secondary | ICD-10-CM | POA: Diagnosis not present

## 2024-02-09 DIAGNOSIS — M869 Osteomyelitis, unspecified: Secondary | ICD-10-CM

## 2024-02-09 DIAGNOSIS — I70291 Other atherosclerosis of native arteries of extremities, right leg: Secondary | ICD-10-CM

## 2024-02-09 DIAGNOSIS — Z9889 Other specified postprocedural states: Secondary | ICD-10-CM

## 2024-02-09 HISTORY — PX: ABDOMINAL AORTOGRAM: CATH118222

## 2024-02-09 HISTORY — PX: LOWER EXTREMITY ANGIOGRAPHY: CATH118251

## 2024-02-09 LAB — GLUCOSE, CAPILLARY
Glucose-Capillary: 250 mg/dL — ABNORMAL HIGH (ref 70–99)
Glucose-Capillary: 199 mg/dL — ABNORMAL HIGH (ref 70–99)
Glucose-Capillary: 317 mg/dL — ABNORMAL HIGH (ref 70–99)
Glucose-Capillary: 180 mg/dL — ABNORMAL HIGH (ref 70–99)

## 2024-02-09 MED ORDER — HEPARIN SODIUM (PORCINE) 1000 UNIT/ML IJ SOLN
INTRAMUSCULAR | Status: AC
  Filled 2024-02-09: qty 10

## 2024-02-09 MED ORDER — HEPARIN SODIUM (PORCINE) 1000 UNIT/ML IJ SOLN
INTRAMUSCULAR | Status: DC | PRN
  Administered 2024-02-09: 8000 [IU] via INTRAVENOUS

## 2024-02-09 MED ORDER — FENTANYL CITRATE (PF) 100 MCG/2ML IJ SOLN
INTRAMUSCULAR | Status: DC | PRN
  Administered 2024-02-09: 50 ug via INTRAVENOUS

## 2024-02-09 MED ORDER — LIDOCAINE HCL (PF) 1 % IJ SOLN
INTRAMUSCULAR | Status: AC
  Filled 2024-02-09: qty 30

## 2024-02-09 MED ORDER — SIMVASTATIN 20 MG PO TABS
20.0000 mg | ORAL_TABLET | Freq: Every day | ORAL | Status: DC
  Administered 2024-02-09 – 2024-02-10 (×2): 20 mg via ORAL
  Filled 2024-02-09 (×2): qty 1

## 2024-02-09 MED ORDER — GLIPIZIDE 10 MG PO TABS
10.0000 mg | ORAL_TABLET | Freq: Every day | ORAL | Status: DC
  Administered 2024-02-09: 10 mg via ORAL
  Filled 2024-02-09 (×2): qty 1

## 2024-02-09 MED ORDER — FENTANYL CITRATE (PF) 100 MCG/2ML IJ SOLN
INTRAMUSCULAR | Status: AC
  Filled 2024-02-09: qty 2

## 2024-02-09 MED ORDER — SODIUM CHLORIDE 0.9 % IV SOLN: 250.0000 mL | INTRAVENOUS | Status: DC | PRN

## 2024-02-09 MED ORDER — SODIUM CHLORIDE 0.9 % WEIGHT BASED INFUSION
1.0000 mL/kg/h | INTRAVENOUS | Status: AC
  Administered 2024-02-09: 1 mL/kg/h via INTRAVENOUS

## 2024-02-09 MED ORDER — IODIXANOL 320 MG/ML IV SOLN
INTRAVENOUS | Status: DC | PRN
  Administered 2024-02-09: 40 mL

## 2024-02-09 MED ORDER — LABETALOL HCL 5 MG/ML IV SOLN
INTRAVENOUS | Status: AC
  Filled 2024-02-09: qty 4

## 2024-02-09 MED ORDER — ACETAMINOPHEN 325 MG PO TABS: 650.0000 mg | ORAL_TABLET | ORAL | Status: DC | PRN

## 2024-02-09 MED ORDER — MIDAZOLAM HCL 2 MG/2ML IJ SOLN
INTRAMUSCULAR | Status: DC | PRN
  Administered 2024-02-09: 1 mg via INTRAVENOUS

## 2024-02-09 MED ORDER — ASPIRIN 81 MG PO TBEC
81.0000 mg | DELAYED_RELEASE_TABLET | Freq: Every day | ORAL | Status: DC
  Administered 2024-02-09 – 2024-02-10 (×2): 81 mg via ORAL
  Filled 2024-02-09 (×2): qty 1

## 2024-02-09 MED ORDER — ONDANSETRON HCL 4 MG/2ML IJ SOLN: 4.0000 mg | Freq: Four times a day (QID) | INTRAMUSCULAR | Status: DC | PRN

## 2024-02-09 MED ORDER — HEPARIN (PORCINE) IN NACL 1000-0.9 UT/500ML-% IV SOLN
INTRAVENOUS | Status: DC | PRN
  Administered 2024-02-09 (×2): 500 mL

## 2024-02-09 MED ORDER — INSULIN GLARGINE 100 UNIT/ML ~~LOC~~ SOLN
15.0000 [IU] | Freq: Every day | SUBCUTANEOUS | Status: DC
  Administered 2024-02-09: 15 [IU] via SUBCUTANEOUS
  Filled 2024-02-09 (×2): qty 0.15

## 2024-02-09 MED ORDER — LIDOCAINE HCL (PF) 1 % IJ SOLN
INTRAMUSCULAR | Status: DC | PRN
  Administered 2024-02-09: 3 mL
  Administered 2024-02-09: 15 mL

## 2024-02-09 MED ORDER — MIDAZOLAM HCL 2 MG/2ML IJ SOLN
INTRAMUSCULAR | Status: AC
  Filled 2024-02-09: qty 2

## 2024-02-09 MED ORDER — SODIUM CHLORIDE 0.9% FLUSH: 3.0000 mL | INTRAVENOUS | Status: DC | PRN

## 2024-02-09 MED ORDER — SODIUM CHLORIDE 0.9% FLUSH
3.0000 mL | Freq: Two times a day (BID) | INTRAVENOUS | Status: DC
  Administered 2024-02-09 – 2024-02-10 (×2): 3 mL via INTRAVENOUS

## 2024-02-09 MED ORDER — HYDRALAZINE HCL 20 MG/ML IJ SOLN: 5.0000 mg | INTRAMUSCULAR | Status: DC | PRN

## 2024-02-09 MED ORDER — LABETALOL HCL 5 MG/ML IV SOLN: 10.0000 mg | INTRAVENOUS | Status: DC | PRN

## 2024-02-09 MED ORDER — LABETALOL HCL 5 MG/ML IV SOLN
INTRAVENOUS | Status: DC | PRN
  Administered 2024-02-09: 10 mg via INTRAVENOUS

## 2024-02-09 MED ORDER — INSULIN GLARGINE 100 UNITS/ML SOLOSTAR PEN: 15.0000 [IU] | PEN_INJECTOR | Freq: Every day | SUBCUTANEOUS | Status: DC

## 2024-02-09 NOTE — Progress Notes (Signed)
 PROGRESS NOTE  Luke [name]  DOB: 1946-01-19  PCP: Cleatus Arlyss RAMAN, MD FMW:[id number]  DOA: 02/06/2024  LOS: 3 days  Hospital Day: 4  Subjective: Patient was seen and examined this afternoon. Pleasant elderly Caucasian male.  Lying on bed.  Not in distress.  Wife at bedside. Underwent angiogram today.  Not endovascular candidate and would require common femoral to posterior tibial artery bypass. Chart reviewed In the last 24 hours, afebrile, heart rate in 80s, blood pressure 140s to 160s, breathing room air Most recent labs from 10/5.  Brief narrative: Luke [name] is a 78 y.o. male with PMH significant for DM2, HTN, HLD, CAD, ischemic cardiomyopathy s/p AICD, A-fib, abdominal aortic aneurysm, CKD, PAD s/p endarterectomies, idiopathic pulmonary fibrosis on 2 L oxygen  at home. 10/1, patient was seen at podiatry office for swelling and drainage from chronic right foot wound.   CT scan of right foot was obtained which showed acute osteomyelitis involving the head of the first metatarsal base of the proximal phalanx, oblique fracture to the distal metaphysis of the fourth metatarsal. With abnormal CT scan report, patient was directed to ED on 10/3.  Hemodynamically stable Started on broad-spectrum IV antibiotics Admitted to [facility] Vascular surgery and podiatry were consulted 10/6, underwent right lower extremity angiogram this morning.  Assessment and plan: Right foot osteomyelitis to multiple sites and digits Lactic acidosis CT right foot revealed osteomyelitis involving head of the first metatarsal, base of the proximal phalanx of the great toe, phalanges of the 3rd and 4th toes, and head of the fourth metatarsal. Podiatry following Currently on IV antibiotics Weightbearing as tolerated in surgical shoe WBC count remains a little elevated but lactic acid level has normalized Podiatry recommended surgical intervention but it seems patient does not want it and try to control  with antibiotics.  ID consulted Pain regimen --- Scheduled: None --- PRN: Tylenol  Recent Labs  Lab 02/06/24 1121 02/06/24 1131 02/06/24 1933 02/06/24 2034 02/07/24 0930  WBC 10.8*  --   --   --  11.4*  LATICACIDVEN  --  3.2* 1.5 1.2  --    PAD s/p prior endarterectomies Abdominal aortic aneurysm Vascular surgery following 10/6, underwent right lower extremity angiogram.   Per vascular surgery, patient is not an endovascular candidate and would require common femoral to posterior tibial artery bypass. PTA meds- Effient , simvastatin  Continue simvastatin .  Effient  on hold   AKI Baseline creatinine less than 1.2.  Presented with creatinine of 1.49. Improved.  Continue to monitor  Recent Labs    04/24/23 1406 10/06/23 1337 02/06/24 1121 02/07/24 0930 02/08/24 0519 02/08/24 0520  BUN 37* 35* 24* 17 13 13   CREATININE 1.33* 1.47 1.49* 1.16 1.07 1.13  CO2 22 28 20* 24 26 27    Hypomagnesemia Magnesium  level is low at 1.1.  Improved with replacement. Recent Labs  Lab 02/06/24 1121 02/07/24 0930 02/08/24 0519 02/08/24 0520  K 4.3 3.9 3.7 3.7  MG  --  1.1* 2.0  --   PHOS  --  3.3  --  3.4   Type 2 diabetes mellitus uncontrolled with hyperglycemia A1c 7.8 on 02/06/2024 Blood sugar level running elevated over 200 PTA meds-Lantus  30 units nightly, glipizide  10 mg daily, metformin  1000 mg twice daily, Currently on SSI/Accu-Cheks.  Blood sugar level remains elevated. Resume glipizide  from this evening.  Restart Lantus  15 units nightly tonight. Recent Labs  Lab 02/08/24 1149 02/08/24 1622 02/08/24 2124 02/09/24 0643 02/09/24 1041  GLUCAP 245* 287* 231* 250* 199*  Hypertension H/o ischemic cardiomyopathy s/p AICD Blood pressure controlled currently PTA meds- Coreg  25 mg twice daily, valsartan  40 mg daily, torsemide  20 mg daily.   Continue Coreg  and irbesartan .  Torsemide  on hold   A-fib Controlled on Coreg   Idiopathic pulmonary fibrosis  Continue 2 L oxygen  at  home.     Mobility: Encourage ambulation PT Orders:   PT Follow up Rec:    Goals of care   Code Status: Full Code     DVT prophylaxis:  SCD's Start: 02/09/24 1401   Antimicrobials: IV cefepime, IV vancomycin Fluid: None Consultants: Vascular surgery, podiatry Family Communication: Wife at bedside  Status: Inpatient Level of care:  Progressive Cardiac   Patient is from: Home Needs to continue in-hospital care: ID consultation required.  Probably needs PICC line Anticipated d/c to: Hopefully home in 1 to 2 days    Diet:  Diet Order             Diet regular Room service appropriate? Yes; Fluid consistency: Thin  Diet effective now                   Scheduled Meds:  aspirin  EC  81 mg Oral Daily   budesonide  0.5 mg Nebulization BID   carvedilol   25 mg Oral BID WC   cholecalciferol   2,000 Units Oral q AM   glipiZIDE   10 mg Oral QAC supper   Influenza vac split trivalent PF  0.5 mL Intramuscular Tomorrow-1000   insulin  aspart  0-15 Units Subcutaneous TID WC   insulin  aspart  0-5 Units Subcutaneous QHS   insulin  glargine  15 Units Subcutaneous QHS   irbesartan   37.5 mg Oral Daily   multivitamin with minerals  1 tablet Oral q AM   simvastatin   20 mg Oral q1800   [START ON 02/10/2024] sodium chloride  flush  3 mL Intravenous Q12H   Treprostinil  64 mcg Inhalation QID    PRN meds: [START ON 02/10/2024] sodium chloride , acetaminophen  **OR** acetaminophen , bisacodyl , hydrALAZINE , labetalol , ondansetron  (ZOFRAN ) IV, polyethylene glycol, [START ON 02/10/2024] sodium chloride  flush   Infusions:   [START ON 02/10/2024] sodium chloride      sodium chloride      ceFEPime (MAXIPIME) IV Stopped (02/09/24 0849)   metronidazole Stopped (02/08/24 2331)   vancomycin 1,500 mg (02/08/24 1155)    Antimicrobials: Anti-infectives (From admission, onward)    Start     Dose/Rate Route Frequency Ordered Stop   02/08/24 1800  ceFEPIme (MAXIPIME) 2 g in sodium chloride  0.9 % 100  mL IVPB        2 g 200 mL/hr over 30 Minutes Intravenous Every 8 hours 02/08/24 1743     02/08/24 1100  vancomycin (VANCOREADY) IVPB 1500 mg/300 mL        1,500 mg 150 mL/hr over 120 Minutes Intravenous Every 24 hours 02/08/24 0950     02/07/24 1000  vancomycin (VANCOREADY) IVPB 1250 mg/250 mL  Status:  Discontinued        1,250 mg 166.7 mL/hr over 90 Minutes Intravenous Every 24 hours 02/06/24 1911 02/08/24 0950   02/06/24 2300  metroNIDAZOLE (FLAGYL) IVPB 500 mg        500 mg 100 mL/hr over 60 Minutes Intravenous Every 12 hours 02/06/24 1903     02/06/24 2200  ceFEPIme (MAXIPIME) 2 g in sodium chloride  0.9 % 100 mL IVPB  Status:  Discontinued        2 g 200 mL/hr over 30 Minutes Intravenous Every 12 hours 02/06/24 1909  02/08/24 1743   02/06/24 1115  cefTRIAXone (ROCEPHIN) 2 g in sodium chloride  0.9 % 100 mL IVPB  Status:  Discontinued       Placed in And Linked Group   2 g 200 mL/hr over 30 Minutes Intravenous Once 02/06/24 1101 02/06/24 1107   02/06/24 1115  metroNIDAZOLE (FLAGYL) IVPB 500 mg  Status:  Discontinued       Placed in And Linked Group   500 mg 100 mL/hr over 60 Minutes Intravenous  Once 02/06/24 1101 02/06/24 1107   02/06/24 1115  vancomycin (VANCOCIN) IVPB 1000 mg/200 mL premix  Status:  Discontinued       Placed in And Linked Group   1,000 mg 200 mL/hr over 60 Minutes Intravenous  Once 02/06/24 1101 02/06/24 1107   02/06/24 1115  vancomycin (VANCOREADY) IVPB 1500 mg/300 mL       Placed in And Linked Group   1,500 mg 150 mL/hr over 120 Minutes Intravenous STAT 02/06/24 1107 02/06/24 1613   02/06/24 1115  cefTRIAXone (ROCEPHIN) 2 g in sodium chloride  0.9 % 100 mL IVPB       Placed in And Linked Group   2 g 200 mL/hr over 30 Minutes Intravenous Once 02/06/24 1107 02/06/24 1158   02/06/24 1115  metroNIDAZOLE (FLAGYL) IVPB 500 mg       Placed in And Linked Group   500 mg 100 mL/hr over 60 Minutes Intravenous  Once 02/06/24 1107 02/06/24 1317        Objective: Vitals:   02/09/24 1300 02/09/24 1328  BP:  (!) 145/86  Pulse: 73 81  Resp: (!) 24 (!) 23  Temp:  97.7 F (36.5 C)  SpO2: (!) 89%     Intake/Output Summary (Last 24 hours) at 02/09/2024 1600 Last data filed at 02/09/2024 1251 Gross per 24 hour  Intake 360 ml  Output 250 ml  Net 110 ml   There were no vitals filed for this visit. Weight change:  Body mass index is 23.88 kg/m.   Physical Exam: General exam: Pleasant, elderly Caucasian male.  Not in distress Skin: No rashes, lesions or ulcers. HEENT: Atraumatic, normocephalic, no obvious bleeding Lungs: Clear to auscultation bilaterally,  CVS: S1, S2, no murmur,   GI/Abd: Soft, nontender, nondistended, bowel sound present,   CNS: Alert, awake, oriented x 3 Psychiatry: Mood appropriate Extremities: No pedal edema, no calf tenderness,   Data Review: I have personally reviewed the laboratory data and studies available.  F/u labs ordered Unresulted Labs (From admission, onward)     Start     Ordered   02/10/24 0500  Lipid panel  (Labs - [place] only)  Tomorrow morning,   R        02/09/24 1358            Signed, Chapman Rota, MD [facility] 02/09/2024

## 2024-02-09 NOTE — Telephone Encounter (Signed)
 Received fax from [facility] requesting Rx for Tyvaso DPI 48 mcg cartridges, Inhale 48 mcg four times per day. Please verify if patient will have dose adjustment due to adverse events.  Nurse triage note 02/02/24 shows patient contacted clinic complaining of side effects on 64mcg dose. Patient asked to remain on the 64mcg dose until 10/6 visit with Dr. Geronimo. Dr. Kara advised how to manage side effects (Imodium as indicated on bottle/package instructions) including parameters for when to stop Tyvaso or reduce dose (if BP <90/60 mmHg).   Called [facility]. Per pharmacist with [facility], they have documentation suggesting patient was off Tyvaso since 9/19, however given further history this does not appear to be the case. No further information on whether patient continued or stopped taking Tyvaso DPI 64mcg dose after receiving advice on 9/29.   Patient is admitted currently for osteomyelitis from 02/06/24 - present. Will call patient when he is discharged to clarify history and dosing. Will call [facility] when I get this information.   Aleck Puls, PharmD, BCPS, CPP Clinical Pharmacist  [facility]

## 2024-02-09 NOTE — Op Note (Signed)
    Patient name: Luke [name] MRN: [id number] DOB: 12/12/1945 Sex: male  02/09/2024 Pre-operative Diagnosis: Chronic limb threatening ischemia of the right leg related to Post-operative diagnosis:  Same Surgeon:  Norman GORMAN Serve, MD Procedure Performed:  Ultrasound-guided access of left common femoral artery Ultrasound-guided access of right PT artery Third order cannulation of right common femoral artery Aortogram and right lower extremity angiogram Pro-glide closure of left common femoral artery 59 minutes of moderate sedation with fentanyl  and Versed   Indications: Luke [name] is a 78 year old male who was admitted with a right toe wound and osteomyelitis.  He had a previous EVAR done.  His ABI  demonstrated significant disease on the right with a toe pressure of 33.  Angiogram was offered, risks and benefits were reviewed and he elected to proceed.  Findings:  Widely patent aortic endograft and bilateral renal arteries.  Widely patent EVAR limbs and iliac systems.  Patent right common femoral endarterectomy site with widely patent profunda.  The SFA is diffusely diseased and is chronically occluded at its mid segment.  An [place] of above-knee popliteal is reconstituted that is severely diseased and the below-knee popliteal artery is completely occluded as well as the TP trunk with reconstitution of the AT PT and peroneal arteries from collaterals.   Procedure:  The patient was identified in the holding area and taken to the cath lab  The patient was then placed supine on the table and prepped and draped in the usual sterile fashion.  A time out was called.  Ultrasound was used to evaluate the left common femoral artery.  It was patent .  A digital ultrasound image was acquired.  A micropuncture needle was used to access the left common femoral artery under ultrasound guidance.  An 018 wire was advanced without resistance and a micropuncture sheath was placed.  The 018 wire was removed and  a benson wire was placed.  The micropuncture sheath was exchanged for a 5 french sheath.  An omniflush catheter was advanced over the wire to the level of L-1.  An abdominal angiogram was obtained.  Next, using a 6-1/2 Aptus tour guide and a glide advantage wire I was able to navigate up and over the flow divider and into the right iliac limb.  The wire was placed into the right SFA and then a Nava cross catheter was placed over this wire into the right femoral artery.  The patient was then systemically heparinized.  A right lower extremity angiogram was then obtained and this demonstrated the above findings.  Given the significant disease and the previous EVAR elected to try to cross these lesions from a retrograde fashion.  Under ultrasound guidance the PT artery was accessed with and the 4 French pedal sheath was placed.  Using an 014 command wire and a CXI catheter I attempted to navigate across the TP trunk and popliteal occlusions although this was unsuccessful in regaining true lumen access.  Given the significance of his disease at this time the case was terminated.  The retrograde wire and catheter were removed and the sheath was removed with manual pressure for hemostasis.  The left groin access was closed with a Pro-glide with excellent hemostasis.  Impression: Multilevel disease, would require right common femoral to PT artery bypass for revascularization   Norman GORMAN Serve MD [facility] [place] Office: [phone]

## 2024-02-09 NOTE — Progress Notes (Signed)
 RLE vein mapping has been completed.   Results can be found under chart review under CV PROC. 02/09/2024 12:19 PM Densil Ottey RVT, RDMS

## 2024-02-09 NOTE — Plan of Care (Signed)

## 2024-02-09 NOTE — Telephone Encounter (Signed)
 Pts wife left message today at 810am stating to cancel pts appt for 10/8 as he is in the hospital and is having a procedure done and they will call once he is out to reschedule the appt.

## 2024-02-09 NOTE — Inpatient Diabetes Management (Signed)
 Inpatient Diabetes Program Recommendations  AACE/ADA: New Consensus Statement on Inpatient Glycemic Control   Target Ranges:  Prepandial:   less than 140 mg/dL      Peak postprandial:   less than 180 mg/dL (1-2 hours)      Critically ill patients:  140 - 180 mg/dL    Latest Reference Range & Units 02/08/24 06:15 02/08/24 11:49 02/08/24 16:22 02/08/24 21:24 02/09/24 06:43 02/09/24 10:41  Glucose-Capillary 70 - 99 mg/dL 815 (H) 754 (H) 712 (H) 231 (H) 250 (H) 199 (H)    Latest Reference Range & Units 02/06/24 18:17  Hemoglobin A1C 4.8 - 5.6 % 7.8 (H)   Review of Glycemic Control  Diabetes history: DM2 Outpatient Diabetes medications: Metformin  1,000mg  BID, Glipizide  5mg  daily, Lantus  30 units daily.  Current orders for Inpatient glycemic control: Novolog  0-15 units TID + HS.   Inpatient Diabetes Program Recommendations:   Insulin : Please consider adding Lantus  15 units daily.   Thanks,  Lavanda Search, RN, MSN, [facility]  Inpatient Diabetes Coordinator  Pager [phone] (8a-5p)

## 2024-02-09 NOTE — Progress Notes (Signed)
    Angiogram reviewed with patient at bedside and wife via telephone.  Unfortunately he is not endovascular candidate and would require common femoral to posterior tibial artery bypass.  Vein mapping has been ordered.  Patient has elected for antibiotics and I will follow him up in approximately 1 month for further evaluation of right common femoral to posterior tibial artery bypass which again would be a high risk procedure.  Xariah Silvernail C. Sheree, MD [facility] [place] Office: [phone] Pager: [phone]

## 2024-02-09 NOTE — Progress Notes (Signed)
 Attempted to see pt but in vascular lab during rounds. Will follow up tomorrow discuss plans and see if he wants to proceed with surgery or continue on abx. If chooses abx therapy, would  recommend ID consult.        Marolyn JULIANNA Honour, DPM [facility] / [facility]                   02/09/2024

## 2024-02-09 NOTE — Progress Notes (Signed)
 Patient arrived from cath lab, oriented to floor, CCMD notified.

## 2024-02-10 ENCOUNTER — Ambulatory Visit: Admitting: Internal Medicine

## 2024-02-10 ENCOUNTER — Other Ambulatory Visit: Payer: Self-pay

## 2024-02-10 ENCOUNTER — Encounter (HOSPITAL_COMMUNITY): Payer: Self-pay | Admitting: Vascular Surgery

## 2024-02-10 DIAGNOSIS — I739 Peripheral vascular disease, unspecified: Secondary | ICD-10-CM

## 2024-02-10 DIAGNOSIS — L97519 Non-pressure chronic ulcer of other part of right foot with unspecified severity: Secondary | ICD-10-CM

## 2024-02-10 DIAGNOSIS — M86171 Other acute osteomyelitis, right ankle and foot: Secondary | ICD-10-CM | POA: Diagnosis not present

## 2024-02-10 DIAGNOSIS — S91109A Unspecified open wound of unspecified toe(s) without damage to nail, initial encounter: Secondary | ICD-10-CM

## 2024-02-10 DIAGNOSIS — Z8619 Personal history of other infectious and parasitic diseases: Secondary | ICD-10-CM

## 2024-02-10 LAB — CK: Total CK: 16 U/L — ABNORMAL LOW (ref 49–397)

## 2024-02-10 LAB — GLUCOSE, CAPILLARY
Glucose-Capillary: 182 mg/dL — ABNORMAL HIGH (ref 70–99)
Glucose-Capillary: 231 mg/dL — ABNORMAL HIGH (ref 70–99)

## 2024-02-10 LAB — LIPID PANEL
Cholesterol: 101 mg/dL (ref 0–200)
HDL: 25 mg/dL — ABNORMAL LOW (ref >40)
LDL Cholesterol: 47 mg/dL (ref 0–99)
Total CHOL/HDL Ratio: 4 ratio
Triglycerides: 144 mg/dL (ref <150)
VLDL: 29 mg/dL (ref 0–40)

## 2024-02-10 MED ORDER — DAPTOMYCIN IV (FOR PTA / DISCHARGE USE ONLY): 700.0000 mg | INTRAVENOUS | 0 refills | Status: DC

## 2024-02-10 MED ORDER — DAPTOMYCIN-SODIUM CHLORIDE 700-0.9 MG/100ML-% IV SOLN
8.0000 mg/kg | Freq: Every day | INTRAVENOUS | Status: DC
  Administered 2024-02-10: 700 mg via INTRAVENOUS
  Filled 2024-02-10: qty 100

## 2024-02-10 MED ORDER — SODIUM CHLORIDE 0.9% FLUSH: 10.0000 mL | INTRAVENOUS | Status: DC | PRN

## 2024-02-10 MED ORDER — SODIUM CHLORIDE 0.9% FLUSH: 10.0000 mL | Freq: Two times a day (BID) | INTRAVENOUS | Status: DC

## 2024-02-10 MED ORDER — CEFEPIME IV (FOR PTA / DISCHARGE USE ONLY): 2.0000 g | Freq: Three times a day (TID) | INTRAVENOUS | 0 refills | Status: DC

## 2024-02-10 MED ORDER — CHLORHEXIDINE GLUCONATE CLOTH 2 % EX PADS
6.0000 | MEDICATED_PAD | Freq: Every day | CUTANEOUS | Status: DC
  Administered 2024-02-10: 6 via TOPICAL

## 2024-02-10 MED ORDER — ASPIRIN 81 MG PO TBEC: 81.0000 mg | DELAYED_RELEASE_TABLET | Freq: Every day | ORAL | Status: DC

## 2024-02-10 MED FILL — Lidocaine HCl Local Preservative Free (PF) Inj 1%: INTRAMUSCULAR | Qty: 30 | Status: AC

## 2024-02-10 NOTE — Progress Notes (Signed)
 Peripherally Inserted Central Catheter Placement  The IV Nurse has discussed with the patient and/or persons authorized to consent for the patient, the purpose of this procedure and the potential benefits and risks involved with this procedure.  The benefits include less needle sticks, lab draws from the catheter, and the patient may be discharged home with the catheter. Risks include, but not limited to, infection, bleeding, blood clot (thrombus formation), and puncture of an artery; nerve damage and irregular heartbeat and possibility to perform a PICC exchange if needed/ordered by physician.  Alternatives to this procedure were also discussed.  Bard Power PICC patient education guide, fact sheet on infection prevention and patient information card has been provided to patient /or left at bedside.    PICC Placement Documentation  PICC Single Lumen 02/10/24 Right Basilic 40 cm 0 cm (Active)  Indication for Insertion or Continuance of Line Prolonged intravenous therapies 02/10/24 1638  Exposed Catheter (cm) 0 cm 02/10/24 1638  Site Assessment Clean, Dry, Intact 02/10/24 1638  Line Status Flushed;Saline locked;Blood return noted 02/10/24 1638  Dressing Type Transparent;Securing device 02/10/24 1638  Dressing Status Antimicrobial disc/dressing in place;Clean, Dry, Intact 02/10/24 1638  Line Care Connections checked and tightened 02/10/24 1638  Line Adjustment (NICU/IV Team Only) No 02/10/24 1638  Dressing Intervention New dressing;Adhesive placed at insertion site (IV team only) 02/10/24 1638  Dressing Change Due 02/17/24 02/10/24 1638       Leita  Aurorah Schlachter 02/10/2024, 4:39 PM

## 2024-02-10 NOTE — Progress Notes (Addendum)
  Progress Note    02/10/2024 7:46 AM 1 Day Post-Op  Subjective:  no complaints   Vitals:   02/09/24 2027 02/09/24 2323  BP: 99/60 110/63  Pulse:  77  Resp:  20  Temp:  97.9 F (36.6 C)  SpO2: 93% 92%   Physical Exam: Lungs:  non labored Incisions:  L groin cath without hematoma; R ankle cath site without hematoma Extremities:  R DP signal by doppler Neurologic: A&O  CBC    Component Value Date/Time   WBC 11.4 (H) 02/07/2024 0930   RBC 3.71 (L) 02/07/2024 0930   HGB 12.4 (L) 02/07/2024 0930   HGB 12.5 (L) 04/24/2023 1406   HCT 36.1 (L) 02/07/2024 0930   HCT 38.1 04/24/2023 1406   PLT 416 (H) 02/07/2024 0930   PLT 334 04/24/2023 1406   MCV 97.3 02/07/2024 0930   MCV 99 (H) 04/24/2023 1406   MCH 33.4 02/07/2024 0930   MCHC 34.3 02/07/2024 0930   RDW 12.1 02/07/2024 0930   RDW 12.4 04/24/2023 1406   LYMPHSABS 0.8 02/07/2024 0930   LYMPHSABS 1.3 04/24/2023 1406   MONOABS 0.6 02/07/2024 0930   EOSABS 0.2 02/07/2024 0930   EOSABS 0.2 04/24/2023 1406   BASOSABS 0.1 02/07/2024 0930   BASOSABS 0.1 04/24/2023 1406    BMET    Component Value Date/Time   NA 140 02/08/2024 0520   NA 139 04/24/2023 1406   K 3.7 02/08/2024 0520   CL 105 02/08/2024 0520   CO2 27 02/08/2024 0520   GLUCOSE 176 (H) 02/08/2024 0520   BUN 13 02/08/2024 0520   BUN 37 (H) 04/24/2023 1406   CREATININE 1.13 02/08/2024 0520   CREATININE 1.40 (H) 11/08/2022 1450   CALCIUM  9.2 02/08/2024 0520   GFRNONAA >60 02/08/2024 0520   GFRAA >60 02/12/2019 0956    INR    Component Value Date/Time   INR 1.2 02/07/2024 0930     Intake/Output Summary (Last 24 hours) at 02/10/2024 0746 Last data filed at 02/10/2024 0500 Gross per 24 hour  Intake 1810 ml  Output 650 ml  Net 1160 ml     Assessment/Plan:  78 y.o. male is s/p diagnostic angiogram RLE 1 Day Post-Op   L groin and R ankle cath site without hematoma.  Angiogram demonstrates occluded SFA, TP trunk.  He will need femoral to tibial  bypass if unable to heel toe wounds.  He will need IV antibiotics per ID.  He will follow up in 1 month in office to discuss bypass surgery with Dr. Sheree.  Ok for discharge from vascular standpoint.   Donnice Sender, PA-C [facility] [phone] 02/10/2024 7:46 AM  I have independently interviewed and examined patient and agree with PA assessment and plan above. Right gsv has been harvested in the groin for vein patch. The remaining vein is diminutive.  He would require high risk of right common femoral to PT artery bypass.  Given minimal osteomyelitis with high risk surgery required revascularization will attempt IV antibiotics.  He has to follow-up with me in 1 month for wound check and further surgical discussion.  He knows to call sooner if there is worsening of his wounds.  Sevon Rotert C. Sheree, MD [facility] [place] Office: [phone] Pager: [phone]

## 2024-02-10 NOTE — Care Management Important Message (Signed)
 Important Message  Patient Details  Name: Luke [name] MRN: [id number] Date of Birth: Mar 30, 1946   Important Message Given:  Yes - Medicare IM     Vonzell Arrie Sharps 02/10/2024, 11:12 AM

## 2024-02-10 NOTE — Consult Note (Signed)
 [facility]    Date of Admission:  02/06/2024 78 year old pleasant male with history of CAD, idiopathic pulmonary fibrosis, peripheral arterial disease, heart failure, ICD, diabetes mellitus presented with chronic superficial ulceration in the right foot which has been ongoing for few years.  Patient states he does not remember when exactly this happened but it has been a ongoing issue for years.  With the podiatrist, the CT imaging revealed osteomyelitis. Patient also with history of vascular disease and has seen vascular surgery July 2025.  Status post right CFA endart endarterectomy on November 19, 2022. Patient was placed on IV ceftriaxone, vancomycin and Flagyl.  Status post diagnostic angiogram right lower extremity yesterday which demonstrated occluded SFA, TP trunk.  As per vascular surgery, patient will need femoral to tibial bypass if unable to heal the wounds.  Patient has been deemed stable to be discharged from vascular standpoint.  Podiatrist has signed off.  Patient insists on going home.   Reason for Consult: Antimicrobial management for osteomyelitis of the right foot.    Referring Provider: Dr. Arlice   Assessment:  Active osteomyelitis involving the head of the 1st metatarsal, base of the proximal phalanx of the right great toe, phalanges of the third and fourth toes, and head of the fourth metatarsal. Nonhealing wounds in the right foot for the past several years. Cellulitis of the right foot, resolved. Peripheral vascular disease. Podiatrist and surgery have signed off.  Their recommendations highly greatly appreciated. History of Pseudomonas aeruginosa infection present from the right foot wound from April 2024.  Sensitivities noted. Patient appears comfortable.  He wants to go home today and is quite adamant about it. Blood cultures x 2 have been negative so far. There is no open wound for us  to send any cultures today.  The right foot  does appear deformed.  Plan: DC ceftriaxone, Flagyl. DC vancomycin. Recommend IV cefepime, dosed as per pharmacy for 6 weeks. Recommend pharmacy Weekly CBC, CMP, ESR, CRP, CK levels while the patient is on antibiotics. Monitor for rhabdomyolysis as well as shortness of breath while the patient is on daptomycin. Follow-up with ID in 3 to 4 weeks. Follow-up with podiatrist as an outpatient. Follow-up with vascular surgery as an outpatient. Recommend PICC line placement. Patient stable from infection point of view.  Principal Problem:   Acute osteomyelitis of right foot (HCC) Active Problems:   Chronic multifocal osteomyelitis of right foot (HCC)   Osteomyelitis (HCC)    aspirin  EC  81 mg Oral Daily   budesonide  0.5 mg Nebulization BID   carvedilol   25 mg Oral BID WC   cholecalciferol   2,000 Units Oral q AM   glipiZIDE   10 mg Oral QAC supper   Influenza vac split trivalent PF  0.5 mL Intramuscular Tomorrow-1000   insulin  aspart  0-15 Units Subcutaneous TID WC   insulin  aspart  0-5 Units Subcutaneous QHS   insulin  glargine  15 Units Subcutaneous QHS   irbesartan   37.5 mg Oral Daily   multivitamin with minerals  1 tablet Oral q AM   simvastatin   20 mg Oral q1800   sodium chloride  flush  3 mL Intravenous Q12H   Treprostinil  64 mcg Inhalation QID    HPI: 78 year old pleasant male with history of CAD, idiopathic pulmonary fibrosis, peripheral arterial disease, heart failure, ICD, diabetes mellitus presented with chronic superficial ulceration in the right foot which has been ongoing for few years.  Patient states he  does not remember when exactly this happened but it has been a ongoing issue for years.  With the podiatrist, the CT imaging revealed osteomyelitis. Patient also with history of vascular disease and has seen vascular surgery July 2025.  Status post right CFA endart endarterectomy on November 19, 2022. Patient was placed on IV ceftriaxone, vancomycin and Flagyl.  Status post  diagnostic angiogram right lower extremity yesterday which demonstrated occluded SFA, TP trunk.  As per vascular surgery, patient will need femoral to tibial bypass if unable to heal the wounds.  Patient has been deemed stable to be discharged from vascular standpoint.  Podiatrist has signed off.  Patient insists on going home.  Review of Systems: Review of Systems  Constitutional:  Positive for malaise/fatigue.  HENT: Negative.    Eyes: Negative.   Respiratory: Negative.    Cardiovascular: Negative.   Gastrointestinal: Negative.   Genitourinary: Negative.   Musculoskeletal: Negative.   Skin:        Wounds present which are nonhealing  Neurological: Negative.   Endo/Heme/Allergies:  Bruises/bleeds easily.  Psychiatric/Behavioral: Negative.      Past Medical History:  Diagnosis Date   AAA (abdominal aortic aneurysm) 2011   Per vascular surgery   Angio-edema    Atrial fibrillation (HCC)    CAD (coronary artery disease)    Presumed CAD with nuclear scan October 09, 2011,  large anteroseptal MI and inferior MI. Catheterization scheduled October 15, 2011   Cardiomyopathy [facility])    Nuclear, October 09, 2011, EF 30%, multiple focal wall motion abnormalities   Chronic kidney disease    CKD3   COVID-19    2021   Diabetes mellitus    type II   GERD (gastroesophageal reflux disease)    Silent   HLD (hyperlipidemia)    Hypertension    white coat HTN-- often elevated in office and controlled on outside checks.   ICD (implantable cardioverter-defibrillator) in place    CRT-D placed March, 2014 complete heart block and k dysfunction   IPF (idiopathic pulmonary fibrosis) (HCC)    LBBB (left bundle branch block)    LBBB on EKG October 11, 2011,  no prior EKG has been done   Low testosterone    Hx of   On home oxygen  therapy    2L as needed   Pacemaker    PAD (peripheral artery disease)    Pancreatitis    Peripheral arterial disease    Umbilical hernia     Social History   Tobacco Use    Smoking status: Former    Current packs/day: 0.00    Average packs/day: 2.0 packs/day for 42.0 years (84.0 ttl pk-yrs)    Types: Cigarettes    Start date: 1964    Quit date: 05/06/2004    Years since quitting: 19.7    Passive exposure: Never   Smokeless tobacco: Never  Vaping Use   Vaping status: Never Used  Substance Use Topics   Alcohol use: Not Currently   Drug use: No    Family History  Problem Relation Age of Onset   Hypertension Mother    Stroke Mother    Hyperlipidemia Mother    Lung cancer Father    Diabetes Sister    Heart disease Sister        Before age 26   Hypertension Sister    Hyperlipidemia Sister    Heart attack Sister    Hypertension Son    Colon cancer Neg Hx    Prostate cancer Neg Hx  Esophageal cancer Neg Hx    Stomach cancer Neg Hx    Rectal cancer Neg Hx    Colon polyps Neg Hx    Pancreatic cancer Neg Hx    Allergies  Allergen Reactions   Aldactone  [Spironolactone ] Other (See Comments)    Hyperkalemia   Brilinta  [Ticagrelor ] Shortness Of Breath    Numbness in hands and feet Blurry vision   Clopidogrel  Rash    Redness and Itchiness   Codeine Rash and Hives   Amoxicillin  Hives   Atorvastatin  Other (See Comments)    Myalgias with lipitor.  Does tolerate simvastatin .     Benadryl  [Diphenhydramine ] Hives   Januvia  [Sitagliptin ] Other (See Comments)    Diarrhea and heart racing   Jardiance  [Empagliflozin ] Other (See Comments)    Polyuria; excessive weight loss   Lisinopril  Other (See Comments)    Possible cause of pancreatitis   Rosuvastatin  Other (See Comments)    myalgia   Lasix  [Furosemide ] Rash    OBJECTIVE: Blood pressure 110/63, pulse 78, temperature 97.9 F (36.6 C), temperature source Oral, resp. rate 14, height 6' 1 (1.854 m), SpO2 93%.  Physical Exam Constitutional:      Appearance: Normal appearance.  HENT:     Head: Normocephalic and atraumatic.     Nose: Nose normal.     Mouth/Throat:     Mouth: Mucous membranes  are moist.  Eyes:     Conjunctiva/sclera: Conjunctivae normal.     Pupils: Pupils are equal, round, and reactive to light.  Cardiovascular:     Rate and Rhythm: Normal rate and regular rhythm.     Heart sounds: Normal heart sounds.  Pulmonary:     Effort: Pulmonary effort is normal.     Breath sounds: Normal breath sounds.  Abdominal:     General: Abdomen is flat. Bowel sounds are normal.     Palpations: Abdomen is soft.  Musculoskeletal:        General: Normal range of motion.     Cervical back: Normal range of motion.  Skin:    General: Skin is warm and dry.  Neurological:     Mental Status: He is alert and oriented to person, place, and time. Mental status is at baseline.  Psychiatric:        Mood and Affect: Mood normal.        Behavior: Behavior normal.        Thought Content: Thought content normal.        Judgment: Judgment normal.     Lab Results Lab Results  Component Value Date   WBC 11.4 (H) 02/07/2024   HGB 12.4 (L) 02/07/2024   HCT 36.1 (L) 02/07/2024   MCV 97.3 02/07/2024   PLT 416 (H) 02/07/2024    Lab Results  Component Value Date   CREATININE 1.13 02/08/2024   BUN 13 02/08/2024   NA 140 02/08/2024   K 3.7 02/08/2024   CL 105 02/08/2024   CO2 27 02/08/2024    Lab Results  Component Value Date   ALT 10 02/07/2024   AST 15 02/07/2024   ALKPHOS 63 02/07/2024   BILITOT 1.0 02/07/2024     Microbiology: Recent Results (from the past 240 hours)  Blood Cultures x 2 sites     Status: None (Preliminary result)   Collection Time: 02/06/24 11:20 AM   Specimen: BLOOD  Result Value Ref Range Status   Specimen Description BLOOD SITE NOT SPECIFIED  Final   Special Requests   Final  BOTTLES DRAWN AEROBIC AND ANAEROBIC Blood Culture results may not be optimal due to an inadequate volume of blood received in culture bottles   Culture   Final    NO GROWTH 4 DAYS Performed at [facility], [address], [place], [place] [address]    Report  Status PENDING  Incomplete  Blood Cultures x 2 sites     Status: None (Preliminary result)   Collection Time: 02/06/24 11:20 AM   Specimen: BLOOD  Result Value Ref Range Status   Specimen Description BLOOD SITE NOT SPECIFIED  Final   Special Requests   Final    BOTTLES DRAWN AEROBIC AND ANAEROBIC Blood Culture results may not be optimal due to an inadequate volume of blood received in culture bottles   Culture   Final    NO GROWTH 4 DAYS Performed at [facility], [address], [place], [place] [address]    Report Status PENDING  Incomplete   US  EKG SITE RITE Result Date: 02/10/2024 If Site Rite image not attached, placement could not be confirmed due to current cardiac rhythm.  VAS US  LOWER EXTREMITY SAPHENOUS VEIN MAPPING Result Date: 02/09/2024 LOWER EXTREMITY VEIN MAPPING Patient Name:  Luke [name]  Date of Exam:   02/09/2024 Medical Rec #: [id number]         Accession #:    [id number] Date of Birth: 1945-08-05          Patient Gender: M Patient Age:   25 years Exam Location:  [facility] Procedure:      VAS US  LOWER EXTREMITY SAPHENOUS VEIN MAPPING Referring Phys: PENNE COLORADO --------------------------------------------------------------------------------  Indications: Pre-op History:     History of PAD; patient is pre-operative for lower extremity bypass              graft. RLE GSV harvest on 11/19/2022 (RLE endarterectomy w/ vein              patch angioplasty)  Comparison Study: Previous exam on 05/27/2022 Performing Technologist: Ezzie Potters RVT, RDMS  Examination Guidelines: A complete evaluation includes B-mode imaging, spectral Doppler, color Doppler, and power Doppler as needed of all accessible portions of each vessel. Bilateral testing is considered an integral part of a complete examination. Limited examinations for reoccurring indications may be performed as noted. +--------------+--------------+-----------------------+------------+-----------+  RT Diameter   RT  Findings            GSV          LT Diameter LT Findings      (cm)                                              (cm)                +--------------+--------------+-----------------------+------------+-----------+               not visualizedSaphenofemoral Junction                        +--------------+--------------+-----------------------+------------+-----------+               not visualized    Proximal thigh                             +--------------+--------------+-----------------------+------------+-----------+  0.15 / 0.32    branching  Mid thigh                               +--------------+--------------+-----------------------+------------+-----------+      0.33                        Distal thigh                              +--------------+--------------+-----------------------+------------+-----------+      0.26                            Knee                                  +--------------+--------------+-----------------------+------------+-----------+      0.24                          Prox calf                               +--------------+--------------+-----------------------+------------+-----------+      0.26                          Mid calf                                +--------------+--------------+-----------------------+------------+-----------+      0.23                         Distal calf                              +--------------+--------------+-----------------------+------------+-----------+      0.23                            Ankle                                 +--------------+--------------+-----------------------+------------+-----------+ Diagnosing physician: Fonda Rim Electronically signed by Fonda Rim on 02/09/2024 at 3:21:33 PM.    Final    PERIPHERAL VASCULAR CATHETERIZATION Result Date: 02/09/2024 Images from the original result were not included. Patient name: Luke [name] MRN:  [id number] DOB: [birth year] Sex: male 02/09/2024 Pre-operative Diagnosis: Chronic limb threatening ischemia of the right leg related to Post-operative diagnosis:  Same Surgeon:  Norman GORMAN Serve, MD Procedure Performed: Ultrasound-guided access of left common femoral artery Ultrasound-guided access of right PT artery Third order cannulation of right common femoral artery Aortogram and right lower extremity angiogram Pro-glide closure of left common femoral artery 59 minutes of moderate sedation with fentanyl  and Versed  Indications: Mr. Krantz is a 78 year old male who was admitted with a right toe wound and osteomyelitis.  He had a previous EVAR done.  His ABI  demonstrated significant disease on the right with a toe pressure of 33.  Angiogram was offered, risks and benefits were reviewed and he elected to proceed. Findings: Widely patent aortic endograft and bilateral renal arteries.  Widely  patent EVAR limbs and iliac systems. Patent right common femoral endarterectomy site with widely patent profunda.  The SFA is diffusely diseased and is chronically occluded at its mid segment.  An [place] of above-knee popliteal is reconstituted that is severely diseased and the below-knee popliteal artery is completely occluded as well as the TP trunk with reconstitution of the AT PT and peroneal arteries from collaterals.  Procedure:  The patient was identified in the holding area and taken to the cath lab  The patient was then placed supine on the table and prepped and draped in the usual sterile fashion.  A time out was called.  Ultrasound was used to evaluate the left common femoral artery.  It was patent .  A digital ultrasound image was acquired.  A micropuncture needle was used to access the left common femoral artery under ultrasound guidance.  An 018 wire was advanced without resistance and a micropuncture sheath was placed.  The 018 wire was removed and a benson wire was placed.  The micropuncture sheath was exchanged for a  5 french sheath.  An omniflush catheter was advanced over the wire to the level of L-1.  An abdominal angiogram was obtained.  Next, using a 6-1/2 Aptus tour guide and a glide advantage wire I was able to navigate up and over the flow divider and into the right iliac limb.  The wire was placed into the right SFA and then a Nava cross catheter was placed over this wire into the right femoral artery.  The patient was then systemically heparinized.  A right lower extremity angiogram was then obtained and this demonstrated the above findings.  Given the significant disease and the previous EVAR elected to try to cross these lesions from a retrograde fashion.  Under ultrasound guidance the PT artery was accessed with and the 4 French pedal sheath was placed.  Using an 014 command wire and a CXI catheter I attempted to navigate across the TP trunk and popliteal occlusions although this was unsuccessful in regaining true lumen access.  Given the significance of his disease at this time the case was terminated.  The retrograde wire and catheter were removed and the sheath was removed with manual pressure for hemostasis.  The left groin access was closed with a Pro-glide with excellent hemostasis. Impression: Multilevel disease, would require right common femoral to PT artery bypass for revascularization Norman GORMAN Serve MD [facility] Office: [phone]  VAS US  LOWER EXTREMITY VENOUS (DVT) Result Date: 02/09/2024  Lower Venous DVT Study Patient Name:  Luke [name]  Date of Exam:   02/06/2024 Medical Rec #: [id number]         Accession #:    [id number] Date of Birth: 1946-04-23          Patient Gender: M Patient Age:   39 years Exam Location:  [facility] Procedure:      VAS US  LOWER EXTREMITY VENOUS (DVT) Referring Phys: EKTA PATEL --------------------------------------------------------------------------------  Indications: Edema. Other Indications: DM foot infections. Comparison  Study: Previous exam on 10/05/2022 was negative for DVT Performing Technologist: Ezzie Potters RVT, RDMS  Examination Guidelines: A complete evaluation includes B-mode imaging, spectral Doppler, color Doppler, and power Doppler as needed of all accessible portions of each vessel. Bilateral testing is considered an integral part of a complete examination. Limited examinations for reoccurring indications may be performed as noted. The reflux portion of the exam is performed with the patient in reverse Trendelenburg.  +---------+---------------+---------+-----------+----------+--------------+  RIGHT    CompressibilityPhasicitySpontaneityPropertiesThrombus Aging +---------+---------------+---------+-----------+----------+--------------+ CFV      Full           Yes      Yes                                 +---------+---------------+---------+-----------+----------+--------------+ SFJ                                                   not visualized +---------+---------------+---------+-----------+----------+--------------+ FV Prox  Full           Yes      Yes                                 +---------+---------------+---------+-----------+----------+--------------+ FV Mid   Full           Yes      Yes                                 +---------+---------------+---------+-----------+----------+--------------+ FV DistalFull           Yes      Yes                                 +---------+---------------+---------+-----------+----------+--------------+ PFV      Full                                                        +---------+---------------+---------+-----------+----------+--------------+ POP      Full           Yes      Yes                                 +---------+---------------+---------+-----------+----------+--------------+ PTV      Full                                                         +---------+---------------+---------+-----------+----------+--------------+ PERO     Full                                                        +---------+---------------+---------+-----------+----------+--------------+   +----+---------------+---------+-----------+----------+--------------+ LEFTCompressibilityPhasicitySpontaneityPropertiesThrombus Aging +----+---------------+---------+-----------+----------+--------------+ CFV Full           Yes      Yes                                 +----+---------------+---------+-----------+----------+--------------+     Summary: RIGHT: - There is no evidence of deep vein thrombosis in the  lower extremity.  - No cystic structure found in the popliteal fossa.  LEFT: - No evidence of common femoral vein obstruction.   *See table(s) above for measurements and observations. Electronically signed by Gaile New MD on 02/09/2024 at 8:16:22 AM.    Final    ECHOCARDIOGRAM COMPLETE Result Date: 02/07/2024    ECHOCARDIOGRAM REPORT   Patient Name:   Luke [name] Date of Exam: 02/07/2024 Medical Rec #:  [id number]        Height:       73.0 in Accession #:    [id number]       Weight:       181.0 lb Date of Birth:  07-01-1945         BSA:          2.062 m Patient Age:    78 years         BP:           141/78 mmHg Patient Gender: M                HR:           103 bpm. Exam Location:  Inpatient Procedure: 2D Echo, Color Doppler and Cardiac Doppler (Both Spectral and Color            Flow Doppler were utilized during procedure). Indications:    Sepsis  History:        Patient has prior history of Echocardiogram examinations, most                 recent 10/30/2022. Cardiomyopathy, CAD, PAD and Abdominal Aorta                 Aneurysm, Arrythmias:LBBB; Risk Factors:Hypertension, Diabetes                 and Dyslipidemia. Chronic Kidney Disease.  Sonographer:    Logan Shove RDCS Referring Phys: [phone] EKTA V PATEL IMPRESSIONS  1. Left ventricular ejection fraction, by  estimation, is 50 to 55%. The left ventricle has low normal function. Left ventricular endocardial border not optimally defined to evaluate regional wall motion. Left ventricular diastolic function could not be evaluated. There is the interventricular septum is flattened in systole and diastole, consistent with right ventricular pressure and volume overload.  2. Right ventricular systolic function is moderately reduced. The right ventricular size is moderately enlarged. Tricuspid regurgitation signal is inadequate for assessing PA pressure.  3. Right atrial size was mildly dilated.  4. The mitral valve is normal in structure. Trivial mitral valve regurgitation. No evidence of mitral stenosis.  5. Tricuspid valve regurgitation is moderate.  6. The aortic valve has an indeterminant number of cusps. Aortic valve regurgitation is not visualized. No aortic stenosis is present.  7. The inferior vena cava is normal in size with <50% respiratory variability, suggesting right atrial pressure of 8 mmHg. FINDINGS  Left Ventricle: Left ventricular ejection fraction, by estimation, is 50 to 55%. The left ventricle has low normal function. Left ventricular endocardial border not optimally defined to evaluate regional wall motion. Strain was performed and the global longitudinal strain is indeterminate. The left ventricular internal cavity size was normal in size. There is no left ventricular hypertrophy. The interventricular septum is flattened in systole and diastole, consistent with right ventricular pressure and  volume overload. Left ventricular diastolic function could not be evaluated due to paced rhythm. Left ventricular diastolic function could not be evaluated. Right Ventricle: The right ventricular size is moderately enlarged.  No increase in right ventricular wall thickness. Right ventricular systolic function is moderately reduced. Tricuspid regurgitation signal is inadequate for assessing PA pressure. Left Atrium:  Left atrial size was normal in size. Right Atrium: Right atrial size was mildly dilated. Pericardium: There is no evidence of pericardial effusion. Mitral Valve: The mitral valve is normal in structure. Trivial mitral valve regurgitation. No evidence of mitral valve stenosis. Tricuspid Valve: The tricuspid valve is normal in structure. Tricuspid valve regurgitation is moderate . No evidence of tricuspid stenosis. Aortic Valve: The aortic valve has an indeterminant number of cusps. Aortic valve regurgitation is not visualized. No aortic stenosis is present. Aortic valve peak gradient measures 8.5 mmHg. Pulmonic Valve: The pulmonic valve was grossly normal. Pulmonic valve regurgitation is mild. No evidence of pulmonic stenosis. Aorta: The aortic root is normal in size and structure. Venous: The inferior vena cava is normal in size with less than 50% respiratory variability, suggesting right atrial pressure of 8 mmHg. IAS/Shunts: No atrial level shunt detected by color flow Doppler. Additional Comments: 3D was performed not requiring image post processing on an independent workstation and was indeterminate. A device lead is visualized.  LEFT VENTRICLE PLAX 2D LVIDd:         4.70 cm      Diastology LVIDs:         3.80 cm      LV e' medial:    16.10 cm/s LV PW:         1.00 cm      LV E/e' medial:  7.1 LV IVS:        1.00 cm      LV e' lateral:   20.70 cm/s LVOT diam:     2.20 cm      LV E/e' lateral: 5.5 LVOT Area:     3.80 cm  LV Volumes (MOD) LV vol d, MOD A2C: 140.0 ml LV vol d, MOD A4C: 153.0 ml LV vol s, MOD A2C: 74.7 ml LV vol s, MOD A4C: 74.6 ml LV SV MOD A2C:     65.3 ml LV SV MOD A4C:     153.0 ml LV SV MOD BP:      73.9 ml RIGHT VENTRICLE            IVC RV Basal diam:  4.40 cm    IVC diam: 1.70 cm RV Mid diam:    3.30 cm RV S prime:     9.61 cm/s TAPSE (M-mode): 1.4 cm LEFT ATRIUM             Index        RIGHT ATRIUM           Index LA diam:        3.30 cm 1.60 cm/m   RA Area:     23.30 cm LA Vol (A2C):    42.8 ml 20.75 ml/m  RA Volume:   73.30 ml  35.54 ml/m LA Vol (A4C):   21.8 ml 10.57 ml/m LA Biplane Vol: 30.0 ml 14.55 ml/m  AORTIC VALVE AV Area (Vmax): 2.08 cm AV Vmax:        146.00 cm/s AV Peak Grad:   8.5 mmHg LVOT Vmax:      79.70 cm/s  AORTA Ao Root diam: 3.20 cm Ao Asc diam:  3.30 cm MITRAL VALVE                TRICUSPID VALVE MV Area (PHT): 5.06 cm     TV Peak grad:  49.0 mmHg MV Decel Time: 150 msec     TV Vmax:        3.50 m/s MV E velocity: 114.00 cm/s MV A velocity: 68.20 cm/s   SHUNTS MV E/A ratio:  1.67         Systemic Diam: 2.20 cm Vishnu Priya Mallipeddi Electronically signed by Diannah Late Mallipeddi Signature Date/Time: 02/07/2024/2:17:49 PM    Final    DG Chest Portable 1 View Result Date: 02/06/2024 CLINICAL DATA:  Hypoxia. EXAM: PORTABLE CHEST 1 VIEW COMPARISON:  10/29/2022, chest CT 01/20/2023 FINDINGS: Left-sided pacemaker unchanged. Lungs are hypoinflated demonstrate coarse interstitial changes over the mid to lower lungs with possible mild mild slight progression compatible with known pulmonary fibrosis. No definite new airspace process or effusion. Cardiomediastinal silhouette and remainder of the exam is unchanged. IMPRESSION: Hypoinflation with coarse interstitial changes over the mid to lower lungs with possible mild progression compatible with known pulmonary fibrosis. Electronically Signed   By: Toribio Agreste M.D.   On: 02/06/2024 16:43   CT ANGIO LOWER EXT BILAT W &/OR WO CONTRAST Result Date: 02/06/2024 CLINICAL DATA:  Right foot wound, claudication EXAM: CT ANGIOGRAPHY OF ABDOMINAL AORTA WITH ILIOFEMORAL RUNOFF TECHNIQUE: Multidetector CT imaging of the abdomen, pelvis and lower extremities was performed using the standard protocol during bolus administration of intravenous contrast. Multiplanar CT image reconstructions and MIPs were obtained to evaluate the vascular anatomy. RADIATION DOSE REDUCTION: This exam was performed according to the departmental  dose-optimization program which includes automated exposure control, adjustment of the mA and/or kV according to patient size and/or use of iterative reconstruction technique. CONTRAST:  OMNIPAQUE  IOHEXOL  350 MG/ML SOLN COMPARISON:  CT scan the abdomen pelvis 08/21/2021 FINDINGS: VASCULAR Aorta: Partially imaged endovascular aortic graft for both the right and left lobes are visible but the body of the graft is not. Treated aneurysm of the infrarenal abdominal aorta. RIGHT Lower Extremity Inflow: Widely patent. Outflow: Mild aneurysmal dilation of the right common femoral artery likely at the site of prior endarterectomy. The vessel measures up to 1.4 cm in diameter. Profunda femoral artery is widely patent. Critical stenosis of the proximal superficial femoral artery due to either peripheral wall adherent mural thrombus or extensive fibrofatty atherosclerotic plaque. Second tandem high-grade stenosis also present in proximal thigh. Heavily calcified atherosclerotic plaque in the mid thigh results complete occlusion of the SFA. The SFA remains occluded through Hunter's canal before reconstituting at the P1 segment of the popliteal artery. Extensive multifocal calcified plaque results in moderate stenosis of the P1 segment. The popliteal artery then occludes completely at the P2 segment and remains occluded through the P3 segment. Runoff: Reconstitution of the runoff vessels at the origin of the anterior tibial artery. Patent 3 vessel runoff to the ankle. LEFT Lower Extremity Inflow: Common, internal and external iliac arteries are patent without evidence of aneurysm, dissection, vasculitis or significant stenosis. Outflow: Scattered calcified plaque along the common femoral artery with mild luminal narrowing. The profunda femoral artery is widely patent. Bulky calcified plaque results in critical stenosis of the origin of the superficial femoral artery. High-grade stenosis also present within the SFA in the  distal thigh just proximal to Hunter's canal. Focal high-grade stenosis of the proximal P3 segment of the popliteal artery. The artery remains patent. Runoff: Likely segmental occlusion of the anterior tibial artery proximal 2 vessel runoff to the ankle. Veins: No focal venous abnormality. Review of the MIP images confirms the above findings. NON-VASCULAR Adrenals/Urinary Tract: Visualized kidneys, ureter and bladder are  all within normal limits. Stomach/Bowel: No focal bowel wall thickening or evidence of obstruction. Normal appendix. Lymphatic: No suspicious lymphadenopathy. Reproductive: Mild prostatomegaly. Other: Fat containing right inguinal hernia.  No ascites. Musculoskeletal: Multilevel degenerative disc disease. No acute fracture or malalignment. No lytic or blastic osseous lesion. IMPRESSION: VASCULAR 1. Severe right worse than left femoropopliteal disease as detailed above. 2. Partially imaged endovascular aortic repair with a bifurcated endovascular prosthesis. The iliac limbs are widely patent and there is no significant inflow disease. 3. Patent 3 vessel runoff to the ankle right. 4. Short segment occlusion of the left anterior tibial artery with patent 2 vessel runoff to the ankle. 5. Mild ectasias a of the right common femoral artery likely at the site of prior endarterectomy. Maximal vessel diameter 1.4 cm. NON-VASCULAR 1. Fat containing right inguinal hernia. 2. Multilevel degenerative disc disease. Electronically Signed   By: Wilkie Lent M.D.   On: 02/06/2024 16:11   CT FOOT RIGHT WO CONTRAST Result Date: 02/05/2024 EXAM: CT RIGHT FOOT AND ANKLE, WITHOUT IV CONTRAST 02/05/2024 04:15:00 PM TECHNIQUE: Axial images were acquired through the right foot and ankle without IV contrast. Reformatted images were reviewed. Automated exposure control, iterative reconstruction, and/or weight based adjustment of the mA/kV was utilized to reduce the radiation dose to as low as reasonably achievable.  COMPARISON: None available. CLINICAL HISTORY: Right foot swelling, suspected osteomyelitis, history of diabetes and PAD, draining wounds, subacute osteomyelitis on prior X-ray. FINDINGS: BONES AND JOINTS: Destructive findings in the head of the 1st metatarsal, base of the proximal phalanx great toe, phalanges of the third toe, phalanges of the fourth toe, and in the head of the fourth metatarsal compatible with active osteomyelitis. Oblique fracture through the distal metaphysis of the 4th metatarsal. Small well corticated ossicles below the medial malleolus. Large plantar and achilles calcaneal spurs. Posterior spur angle in the posterior subtalar joint. Mild dorsal midfoot spurring. Mild scallop angle in the superior margins of the 1st digit sesamoids possibly from sesamoiditis or osteomyelitis. No dislocation. The joint spaces are normal. SOFT TISSUES: Circumferential subcutaneous edema in the ankle. Dorsal subcutaneous edema in the forefoot. IMPRESSION: 1. Active osteomyelitis involving the head of the 1st metatarsal, base of the proximal phalanx of the great toe, phalanges of the third and fourth toes, and head of the fourth metatarsal. 2. Oblique fracture through the distal metaphysis of the 4th metatarsal. 3. Possible sesamoiditis or osteomyelitis of the 1st digit sesamoids. 4. Circumferential ankle and dorsal forefoot subcutaneous edema. 5. Large plantar and Achilles calcaneal spurs, posterior subtalar spur, and mild dorsal midfoot spurring. 6. Small well-corticated ossicles below the medial malleolus. Electronically signed by: Ryan Salvage MD 02/05/2024 04:30 PM EDT RP Workstation: [id number]   DG Foot Complete Right Result Date: 02/04/2024 Please see detailed radiograph report in office note.  CUP PACEART REMOTE DEVICE CHECK Result Date: 01/21/2024 ICD Scheduled remote reviewed. Normal device function.  Presenting rhythm:  AS-BIV paced. Next remote 91 days. - CS, CVRS   Dr Katharine Metro, MD   [facility] Cell phone: [phone].  02/10/2024 12:00 PM    Total Encounter Time: 80 minutes

## 2024-02-10 NOTE — Discharge Summary (Signed)
 Physician Discharge Summary  BEXTON HAAK FMW:[id number] DOB: [birth year] DOA: 02/06/2024  PCP: Cleatus Arlyss RAMAN, MD  Admit date: 02/06/2024 Discharge date: 02/10/2024  Admitted from: Home Discharge disposition: Home with home health nurse  Recommendations at discharge:  Complete 6 weeks course of IV antibiotics at home. Hold statin while on IV daptomycin because of the risk of rhabdomyolysis. Needs follow-up labs with ID Follow-up with podiatry and ID as an outpatient.    Subjective: Patient was seen and examined this morning. Lying on bed.  Not in distress. Seen by ID this morning.  Home antibiotics recommended.  PICC line inserted Afebrile, hemodynamically stable.  With blood pressure mostly in low normal range today. Blood sugar level 180s fasting this morning  Brief narrative: Luke [name] is a 78 y.o. male with PMH significant for DM2, HTN, HLD, CAD, ischemic cardiomyopathy s/p AICD, A-fib, abdominal aortic aneurysm, CKD, PAD s/p endarterectomies, idiopathic pulmonary fibrosis on 2 L oxygen  at home. 10/1, patient was seen at podiatry office for swelling and drainage from chronic right foot wound.   CT scan of right foot was obtained which showed acute osteomyelitis involving the head of the first metatarsal base of the proximal phalanx, oblique fracture to the distal metaphysis of the fourth metatarsal. With abnormal CT scan report, patient was directed to ED on 10/3.  Hemodynamically stable Started on broad-spectrum IV antibiotics Admitted to [facility] Vascular surgery and podiatry were consulted 10/6, underwent right lower extremity angiogram  Hospital course: Right foot osteomyelitis to multiple sites and digits Lactic acidosis CT right foot revealed osteomyelitis involving head of the first metatarsal, base of the proximal phalanx of the great toe, phalanges of the 3rd and 4th toes, and head of the fourth metatarsal. Podiatry recommended surgical intervention but it  seems patient does not want it and try to control with antibiotics.  ID consultation was obtained.. WBC count remains a little elevated but lactic acid level has normalized Switched antibiotics to IV cefepime and IV daptomycin. Recommended for total of 6 weeks. To follow-up with ID and podiatry as an outpatient.  Needs follow-up labs with ID Per podiatry, weightbearing as tolerated in surgical shoe Continue pain control Tylenol  as needed Recent Labs  Lab 02/06/24 1121 02/06/24 1131 02/06/24 1933 02/06/24 2034 02/07/24 0930  WBC 10.8*  --   --   --  11.4*  LATICACIDVEN  --  3.2* 1.5 1.2  --    PAD s/p prior endarterectomies Abdominal aortic aneurysm Vascular surgery following 10/6, underwent right lower extremity angiogram.  Angiogram showed occluded SFA, TP trunk.  Per vascular surgery, patient is not an endovascular candidate and would require common femoral to posterior tibial artery bypass if unable to heal toe wounds.  Patient will follow-up with vascular surgery as an outpatient PTA meds- Effient , simvastatin .  Continue Effient   Hold statin while on IV daptomycin because of the risk of rhabdomyolysis   AKI Baseline creatinine less than 1.2.  Presented with creatinine of 1.49. Improved.  Continue to monitor  Recent Labs    04/24/23 1406 10/06/23 1337 02/06/24 1121 02/07/24 0930 02/08/24 0519 02/08/24 0520  BUN 37* 35* 24* 17 13 13   CREATININE 1.33* 1.47 1.49* 1.16 1.07 1.13  CO2 22 28 20* 24 26 27    Hypomagnesemia Magnesium  level is low at 1.1.  Improved with replacement. Recent Labs  Lab 02/06/24 1121 02/07/24 0930 02/08/24 0519 02/08/24 0520  K 4.3 3.9 3.7 3.7  MG  --  1.1* 2.0  --  PHOS  --  3.3  --  3.4   Type 2 diabetes mellitus uncontrolled with hyperglycemia A1c 7.8 on 02/06/2024 PTA meds-Lantus  30 units nightly, glipizide  10 mg daily, metformin  1000 mg twice daily, Diabetes care coordinator consult appreciated. Continue previous regimen  postdischarge Recent Labs  Lab 02/09/24 1041 02/09/24 1714 02/09/24 2053 02/10/24 0601 02/10/24 1125  GLUCAP 199* 317* 180* 182* 231*   Hypertension H/o ischemic cardiomyopathy s/p AICD Blood pressure controlled currently PTA meds- Coreg  25 mg twice daily, valsartan  40 mg daily, torsemide  20 mg daily.   Continue Coreg  and irbesartan .  Torsemide  on hold.  Can resume postdischarge   A-fib Controlled on Coreg   Idiopathic pulmonary fibrosis  Continue 2 L oxygen  at home.    Mobility: Independently able to ambulate.    PT Orders:   PT Follow up Rec:    Goals of care   Code Status: Full Code   Diet:  Diet Order             Diet general           Diet regular Room service appropriate? Yes; Fluid consistency: Thin  Diet effective now                   Nutritional status:  Body mass index is 23.88 kg/m.       Wounds:  -    Discharge Medications:   Allergies as of 02/10/2024       Reactions   Aldactone  [spironolactone ] Other (See Comments)   Hyperkalemia   Brilinta  [ticagrelor ] Shortness Of Breath   Numbness in hands and feet Blurry vision   Clopidogrel  Rash   Redness and Itchiness   Codeine Rash, Hives   Amoxicillin  Hives   Atorvastatin  Other (See Comments)   Myalgias with lipitor.  Does tolerate simvastatin .     Benadryl  [diphenhydramine ] Hives   Januvia  [sitagliptin ] Other (See Comments)   Diarrhea and heart racing   Jardiance  [empagliflozin ] Other (See Comments)   Polyuria; excessive weight loss   Lisinopril  Other (See Comments)   Possible cause of pancreatitis   Rosuvastatin  Other (See Comments)   myalgia   Lasix  [furosemide ] Rash        Medication List     PAUSE taking these medications    simvastatin  20 MG tablet Wait to take this until your doctor or other care provider tells you to start again. Commonly known as: ZOCOR  TAKE 1 TABLET(20 MG) BY MOUTH AT BEDTIME       STOP taking these medications    ciprofloxacin  500 MG  tablet Commonly known as: Cipro    doxycycline  100 MG tablet Commonly known as: VIBRA -TABS   Entresto  49-51 MG Generic drug: sacubitril-valsartan    Incruse Ellipta  62.5 MCG/ACT Aepb Generic drug: umeclidinium bromide    silver  sulfADIAZINE  1 % cream Commonly known as: Silvadene        TAKE these medications    Accu-Chek Aviva Plus test strip Generic drug: glucose blood USE AS DIRECTED TO TEST BLOOD SUGAR TWICE DAILY   aspirin  EC 81 MG tablet Take 1 tablet (81 mg total) by mouth daily. Swallow whole. Start taking on: February 11, 2024 What changed: additional instructions   carvedilol  25 MG tablet Commonly known as: COREG  TAKE 1 TABLET BY MOUTH TWICE DAILY WITH MEALS   ceFEPime IVPB Commonly known as: MAXIPIME Inject 2 g into the vein every 8 (eight) hours. Indication:  R-foot osteo First Dose: Yes Last Day of Therapy:  03/19/24 Labs - Once weekly:  CBC/D and  BMP, Labs - Once weekly: ESR and CRP Method of administration: IV Push Method of administration may be changed at the discretion of home infusion pharmacist based upon assessment of the patient and/or caregiver's ability to self-administer the medication ordered.   daptomycin IVPB Commonly known as: CUBICIN Inject 700 mg into the vein daily. Indication:  R-foot osteo First Dose: Yes Last Day of Therapy:  03/19/24 Labs - Once weekly:  CBC/D, BMP, and CPK Labs - Once weekly: ESR and CRP Method of administration: IV Push Method of administration may be changed at the discretion of home infusion pharmacist based upon assessment of the patient and/or caregiver's ability to self-administer the medication ordered.   glipiZIDE  5 MG tablet Commonly known as: GLUCOTROL  Take 2 tablets (10 mg total) by mouth daily. With supper. What changed:  when to take this additional instructions   Lantus  SoloStar 100 UNIT/ML Solostar Pen Generic drug: insulin  glargine INJECT 0.3 TO 0.35 MLS(30 TO 35 UNITS) INTO THE SKIN EVERY  DAY What changed:  how much to take how to take this when to take this additional instructions   metFORMIN  500 MG tablet Commonly known as: GLUCOPHAGE  TAKE 2 TABLETS BY MOUTH TWICE DAILY WITH FOOD   multivitamin with minerals Tabs tablet Take 1 tablet by mouth in the morning.   omeprazole  40 MG capsule Commonly known as: PRILOSEC TAKE 1 CAPSULE(40 MG) BY MOUTH DAILY What changed: See the new instructions.   prasugrel  10 MG Tabs tablet Commonly known as: EFFIENT  Take 0.5 tablets (5 mg total) by mouth in the morning and at bedtime.   torsemide  20 MG tablet Commonly known as: DEMADEX  TAKE 1 TABLET(20 MG) BY MOUTH DAILY   triamcinolone  ointment 0.1 % Commonly known as: KENALOG  Apply topically 2 (two) times daily. What changed:  how much to take when to take this reasons to take this   Tyvaso DPI Maintenance Kit 64 MCG Powd Generic drug: Treprostinil Inhale 1 puff into the lungs in the morning, at noon, in the evening, and at bedtime.   valsartan  40 MG tablet Commonly known as: DIOVAN  Take 1 tablet (40 mg total) by mouth daily with supper.   VITAMIN B-12 PO Take 2,000 mcg by mouth in the morning.   Vitamin D3 50 MCG (2000 UT) Tabs Take 2,000 Units by mouth in the morning.               Discharge Care Instructions  (From admission, onward)           Start     Ordered   02/10/24 0000  Change dressing on IV access line weekly and PRN  (Home infusion instructions - [facility] )        02/10/24 1338             Follow ups:    Follow-up Information     Cleatus Arlyss RAMAN, MD Follow up.   Specialty: Family Medicine Contact information: [address] [place] [address] [phone]         Zoanne Katharine LABOR, MD Follow up.   Specialty: Infectious Diseases Contact information: [address], [address] [place] [address] [phone]                 Discharge Instructions:   Discharge Instructions      [facility] pharmacist to adjust dose for Vancomycin, Aminoglycosides and other anti-infective therapies as requested by physician.   Complete by: As directed    [facility] to provide Cath Flo 2mg    Complete by: As directed    Administer for PICC line occlusion and as ordered by physician for other access device issues.   Anaphylaxis Kit: Provided to treat any anaphylactic reaction to the medication being provided to the patient if First Dose or when requested by physician   Complete by: As directed    Epinephrine  1mg /ml vial / amp: Administer 0.3mg  (0.12ml) subcutaneously once for moderate to severe anaphylaxis, nurse to call physician and pharmacy when reaction occurs and call 911 if needed for immediate care   Diphenhydramine  50mg /ml IV vial: Administer 25-50mg  IV/IM PRN for first dose reaction, rash, itching, mild reaction, nurse to call physician and pharmacy when reaction occurs   Sodium Chloride  0.9% NS 500ml IV: Administer if needed for hypovolemic blood pressure drop or as ordered by physician after call to physician with anaphylactic reaction   Call MD for:  difficulty breathing, headache or visual disturbances   Complete by: As directed    Call MD for:  extreme fatigue   Complete by: As directed    Call MD for:  hives   Complete by: As directed    Call MD for:  persistant dizziness or light-headedness   Complete by: As directed    Call MD for:  persistant nausea and vomiting   Complete by: As directed    Call MD for:  severe uncontrolled pain   Complete by: As directed    Call MD for:  temperature >100.4   Complete by: As directed    Change dressing on IV access line weekly and PRN   Complete by: As directed    Diet general   Complete by: As directed    Discharge instructions   Complete by: As directed    Recommendations at discharge:   Complete 6 weeks course of IV antibiotics at home.  Hold statin while on IV daptomycin because of the risk of  rhabdomyolysis. Needs follow-up labs with ID  Follow-up with podiatry and ID as an outpatient.   General discharge instructions: Follow with Primary MD Cleatus Arlyss RAMAN, MD in 7 days  Please request your PCP  to go over your hospital tests, procedures, radiology results at the follow up. Please get your medicines reviewed and adjusted.  Your PCP may decide to repeat certain labs or tests as needed. Do not drive, operate heavy machinery, perform activities at heights, swimming or participation in water  activities or provide baby sitting services if your were admitted for syncope or siezures until you have seen by Primary MD or a Neurologist and advised to do so again. [place]  Controlled Substance Reporting System database was reviewed. Do not drive, operate heavy machinery, perform activities at heights, swim, participate in water  activities or provide baby-sitting services while on medications for pain, sleep and mood until your outpatient physician has reevaluated you and advised to do so again.  You are strongly recommended to comply with the dose, frequency and duration of prescribed medications. Activity: As tolerated with Full fall precautions use walker/cane & assistance as needed Avoid using any recreational substances like cigarette, tobacco, alcohol, or non-prescribed drug. If you experience worsening of your admission symptoms, develop shortness of breath, life threatening emergency, suicidal or homicidal thoughts you must seek medical attention immediately by calling 911 or calling your MD immediately  if symptoms less severe. You must read complete instructions/literature along with all the possible adverse reactions/side effects for all the medicines you take and that have been  prescribed to you. Take any new medicine only after you have completely understood and accepted all the possible adverse reactions/side effects.  Wear Seat belts while driving. You were cared for by a  hospitalist during your hospital stay. If you have any questions about your discharge medications or the care you received while you were in the hospital after you are discharged, you can call the unit and ask to speak with the hospitalist or the covering physician. Once you are discharged, your primary care physician will handle any further medical issues. Please note that NO REFILLS for any discharge medications will be authorized once you are discharged, as it is imperative that you return to your primary care physician (or establish a relationship with a primary care physician if you do not have one).   Flush IV access with Sodium Chloride  0.9% and Heparin  10 units/ml or 100 units/ml   Complete by: As directed    Home infusion instructions - [facility]   Complete by: As directed    Instructions: Flush IV access with Sodium Chloride  0.9% and Heparin  10units/ml or 100units/ml   Change dressing on IV access line: Weekly and PRN   Instructions Cath Flo 2mg : Administer for PICC Line occlusion and as ordered by physician for other access device   [facility] pharmacist to adjust dose for: Vancomycin, Aminoglycosides and other anti-infective therapies as requested by physician   Increase activity slowly   Complete by: As directed    Method of administration may be changed at the discretion of home infusion pharmacist based upon assessment of the patient and/or caregiver's ability to self-administer the medication ordered   Complete by: As directed        Discharge Exam:   Vitals:   02/09/24 2027 02/09/24 2323 02/10/24 0749 02/10/24 1254  BP: 99/60 110/63  126/77  Pulse:  77 78 69  Resp:  20 14 20   Temp:  97.9 F (36.6 C)  (!) 97.5 F (36.4 C)  TempSrc:  Oral  Oral  SpO2: 93% 92% 93% 95%  Height:        Body mass index is 23.88 kg/m.  General exam: Pleasant, elderly Caucasian male.  Not in distress Skin: No rashes, lesions or ulcers. HEENT: Atraumatic,  normocephalic, no obvious bleeding Lungs: Clear to auscultation bilaterally,  CVS: S1, S2, no murmur,   GI/Abd: Soft, nontender, nondistended, bowel sound present,   CNS: Alert, awake, oriented x 3 Psychiatry: Mood appropriate Extremities: No pedal edema, no calf tenderness,    The results of significant diagnostics from this hospitalization (including imaging, microbiology, ancillary and laboratory) are listed below for reference.    Procedures and Diagnostic Studies:   VAS US  LOWER EXTREMITY VENOUS (DVT) Result Date: 02/09/2024  Lower Venous DVT Study Patient Name:  KELLIN BARTLING  Date of Exam:   02/06/2024 Medical Rec #: [id number]         Accession #:    [id number] Date of Birth: Sep 13, 1945          Patient Gender: M Patient Age:   50 years Exam Location:  [facility] Procedure:      VAS US  LOWER EXTREMITY VENOUS (DVT) Referring Phys: EKTA PATEL --------------------------------------------------------------------------------  Indications: Edema. Other Indications: DM foot infections. Comparison Study: Previous exam on 10/05/2022 was negative for DVT Performing Technologist: Ezzie Potters RVT, RDMS  Examination Guidelines: A complete evaluation includes B-mode imaging, spectral Doppler, color Doppler, and power Doppler as needed of all accessible portions of each vessel.  Bilateral testing is considered an integral part of a complete examination. Limited examinations for reoccurring indications may be performed as noted. The reflux portion of the exam is performed with the patient in reverse Trendelenburg.  +---------+---------------+---------+-----------+----------+--------------+ RIGHT    CompressibilityPhasicitySpontaneityPropertiesThrombus Aging +---------+---------------+---------+-----------+----------+--------------+ CFV      Full           Yes      Yes                                 +---------+---------------+---------+-----------+----------+--------------+ SFJ                                                    not visualized +---------+---------------+---------+-----------+----------+--------------+ FV Prox  Full           Yes      Yes                                 +---------+---------------+---------+-----------+----------+--------------+ FV Mid   Full           Yes      Yes                                 +---------+---------------+---------+-----------+----------+--------------+ FV DistalFull           Yes      Yes                                 +---------+---------------+---------+-----------+----------+--------------+ PFV      Full                                                        +---------+---------------+---------+-----------+----------+--------------+ POP      Full           Yes      Yes                                 +---------+---------------+---------+-----------+----------+--------------+ PTV      Full                                                        +---------+---------------+---------+-----------+----------+--------------+ PERO     Full                                                        +---------+---------------+---------+-----------+----------+--------------+   +----+---------------+---------+-----------+----------+--------------+ LEFTCompressibilityPhasicitySpontaneityPropertiesThrombus Aging +----+---------------+---------+-----------+----------+--------------+ CFV Full           Yes      Yes                                 +----+---------------+---------+-----------+----------+--------------+  Summary: RIGHT: - There is no evidence of deep vein thrombosis in the lower extremity.  - No cystic structure found in the popliteal fossa.  LEFT: - No evidence of common femoral vein obstruction.   *See table(s) above for measurements and observations. Electronically signed by Gaile New MD on 02/09/2024 at 8:16:22 AM.    Final    ECHOCARDIOGRAM COMPLETE Result Date: 02/07/2024     ECHOCARDIOGRAM REPORT   Patient Name:   JOHNTA COUTS Date of Exam: 02/07/2024 Medical Rec #:  [id number]        Height:       73.0 in Accession #:    [id number]       Weight:       181.0 lb Date of Birth:  [birth year]         BSA:          2.062 m Patient Age:    78 years         BP:           141/78 mmHg Patient Gender: M                HR:           103 bpm. Exam Location:  Inpatient Procedure: 2D Echo, Color Doppler and Cardiac Doppler (Both Spectral and Color            Flow Doppler were utilized during procedure). Indications:    Sepsis  History:        Patient has prior history of Echocardiogram examinations, most                 recent 10/30/2022. Cardiomyopathy, CAD, PAD and Abdominal Aorta                 Aneurysm, Arrythmias:LBBB; Risk Factors:Hypertension, Diabetes                 and Dyslipidemia. Chronic Kidney Disease.  Sonographer:    Logan Shove RDCS Referring Phys: [phone] EKTA V PATEL IMPRESSIONS  1. Left ventricular ejection fraction, by estimation, is 50 to 55%. The left ventricle has low normal function. Left ventricular endocardial border not optimally defined to evaluate regional wall motion. Left ventricular diastolic function could not be evaluated. There is the interventricular septum is flattened in systole and diastole, consistent with right ventricular pressure and volume overload.  2. Right ventricular systolic function is moderately reduced. The right ventricular size is moderately enlarged. Tricuspid regurgitation signal is inadequate for assessing PA pressure.  3. Right atrial size was mildly dilated.  4. The mitral valve is normal in structure. Trivial mitral valve regurgitation. No evidence of mitral stenosis.  5. Tricuspid valve regurgitation is moderate.  6. The aortic valve has an indeterminant number of cusps. Aortic valve regurgitation is not visualized. No aortic stenosis is present.  7. The inferior vena cava is normal in size with <50% respiratory variability, suggesting right  atrial pressure of 8 mmHg. FINDINGS  Left Ventricle: Left ventricular ejection fraction, by estimation, is 50 to 55%. The left ventricle has low normal function. Left ventricular endocardial border not optimally defined to evaluate regional wall motion. Strain was performed and the global longitudinal strain is indeterminate. The left ventricular internal cavity size was normal in size. There is no left ventricular hypertrophy. The interventricular septum is flattened in systole and diastole, consistent with right ventricular pressure and  volume overload. Left ventricular diastolic function could not be evaluated due to paced rhythm. Left ventricular diastolic  function could not be evaluated. Right Ventricle: The right ventricular size is moderately enlarged. No increase in right ventricular wall thickness. Right ventricular systolic function is moderately reduced. Tricuspid regurgitation signal is inadequate for assessing PA pressure. Left Atrium: Left atrial size was normal in size. Right Atrium: Right atrial size was mildly dilated. Pericardium: There is no evidence of pericardial effusion. Mitral Valve: The mitral valve is normal in structure. Trivial mitral valve regurgitation. No evidence of mitral valve stenosis. Tricuspid Valve: The tricuspid valve is normal in structure. Tricuspid valve regurgitation is moderate . No evidence of tricuspid stenosis. Aortic Valve: The aortic valve has an indeterminant number of cusps. Aortic valve regurgitation is not visualized. No aortic stenosis is present. Aortic valve peak gradient measures 8.5 mmHg. Pulmonic Valve: The pulmonic valve was grossly normal. Pulmonic valve regurgitation is mild. No evidence of pulmonic stenosis. Aorta: The aortic root is normal in size and structure. Venous: The inferior vena cava is normal in size with less than 50% respiratory variability, suggesting right atrial pressure of 8 mmHg. IAS/Shunts: No atrial level shunt detected by color flow  Doppler. Additional Comments: 3D was performed not requiring image post processing on an independent workstation and was indeterminate. A device lead is visualized.  LEFT VENTRICLE PLAX 2D LVIDd:         4.70 cm      Diastology LVIDs:         3.80 cm      LV e' medial:    16.10 cm/s LV PW:         1.00 cm      LV E/e' medial:  7.1 LV IVS:        1.00 cm      LV e' lateral:   20.70 cm/s LVOT diam:     2.20 cm      LV E/e' lateral: 5.5 LVOT Area:     3.80 cm  LV Volumes (MOD) LV vol d, MOD A2C: 140.0 ml LV vol d, MOD A4C: 153.0 ml LV vol s, MOD A2C: 74.7 ml LV vol s, MOD A4C: 74.6 ml LV SV MOD A2C:     65.3 ml LV SV MOD A4C:     153.0 ml LV SV MOD BP:      73.9 ml RIGHT VENTRICLE            IVC RV Basal diam:  4.40 cm    IVC diam: 1.70 cm RV Mid diam:    3.30 cm RV S prime:     9.61 cm/s TAPSE (M-mode): 1.4 cm LEFT ATRIUM             Index        RIGHT ATRIUM           Index LA diam:        3.30 cm 1.60 cm/m   RA Area:     23.30 cm LA Vol (A2C):   42.8 ml 20.75 ml/m  RA Volume:   73.30 ml  35.54 ml/m LA Vol (A4C):   21.8 ml 10.57 ml/m LA Biplane Vol: 30.0 ml 14.55 ml/m  AORTIC VALVE AV Area (Vmax): 2.08 cm AV Vmax:        146.00 cm/s AV Peak Grad:   8.5 mmHg LVOT Vmax:      79.70 cm/s  AORTA Ao Root diam: 3.20 cm Ao Asc diam:  3.30 cm MITRAL VALVE                TRICUSPID VALVE  MV Area (PHT): 5.06 cm     TV Peak grad:   49.0 mmHg MV Decel Time: 150 msec     TV Vmax:        3.50 m/s MV E velocity: 114.00 cm/s MV A velocity: 68.20 cm/s   SHUNTS MV E/A ratio:  1.67         Systemic Diam: 2.20 cm Vishnu Priya Mallipeddi Electronically signed by Diannah Late Mallipeddi Signature Date/Time: 02/07/2024/2:17:49 PM    Final    DG Chest Portable 1 View Result Date: 02/06/2024 CLINICAL DATA:  Hypoxia. EXAM: PORTABLE CHEST 1 VIEW COMPARISON:  10/29/2022, chest CT 01/20/2023 FINDINGS: Left-sided pacemaker unchanged. Lungs are hypoinflated demonstrate coarse interstitial changes over the mid to lower lungs with possible  mild mild slight progression compatible with known pulmonary fibrosis. No definite new airspace process or effusion. Cardiomediastinal silhouette and remainder of the exam is unchanged. IMPRESSION: Hypoinflation with coarse interstitial changes over the mid to lower lungs with possible mild progression compatible with known pulmonary fibrosis. Electronically Signed   By: Toribio Agreste M.D.   On: 02/06/2024 16:43   CT ANGIO LOWER EXT BILAT W &/OR WO CONTRAST Result Date: 02/06/2024 CLINICAL DATA:  Right foot wound, claudication EXAM: CT ANGIOGRAPHY OF ABDOMINAL AORTA WITH ILIOFEMORAL RUNOFF TECHNIQUE: Multidetector CT imaging of the abdomen, pelvis and lower extremities was performed using the standard protocol during bolus administration of intravenous contrast. Multiplanar CT image reconstructions and MIPs were obtained to evaluate the vascular anatomy. RADIATION DOSE REDUCTION: This exam was performed according to the departmental dose-optimization program which includes automated exposure control, adjustment of the mA and/or kV according to patient size and/or use of iterative reconstruction technique. CONTRAST:  OMNIPAQUE  IOHEXOL  350 MG/ML SOLN COMPARISON:  CT scan the abdomen pelvis 08/21/2021 FINDINGS: VASCULAR Aorta: Partially imaged endovascular aortic graft for both the right and left lobes are visible but the body of the graft is not. Treated aneurysm of the infrarenal abdominal aorta. RIGHT Lower Extremity Inflow: Widely patent. Outflow: Mild aneurysmal dilation of the right common femoral artery likely at the site of prior endarterectomy. The vessel measures up to 1.4 cm in diameter. Profunda femoral artery is widely patent. Critical stenosis of the proximal superficial femoral artery due to either peripheral wall adherent mural thrombus or extensive fibrofatty atherosclerotic plaque. Second tandem high-grade stenosis also present in proximal thigh. Heavily calcified atherosclerotic plaque in  the mid thigh results complete occlusion of the SFA. The SFA remains occluded through Hunter's canal before reconstituting at the P1 segment of the popliteal artery. Extensive multifocal calcified plaque results in moderate stenosis of the P1 segment. The popliteal artery then occludes completely at the P2 segment and remains occluded through the P3 segment. Runoff: Reconstitution of the runoff vessels at the origin of the anterior tibial artery. Patent 3 vessel runoff to the ankle. LEFT Lower Extremity Inflow: Common, internal and external iliac arteries are patent without evidence of aneurysm, dissection, vasculitis or significant stenosis. Outflow: Scattered calcified plaque along the common femoral artery with mild luminal narrowing. The profunda femoral artery is widely patent. Bulky calcified plaque results in critical stenosis of the origin of the superficial femoral artery. High-grade stenosis also present within the SFA in the distal thigh just proximal to Hunter's canal. Focal high-grade stenosis of the proximal P3 segment of the popliteal artery. The artery remains patent. Runoff: Likely segmental occlusion of the anterior tibial artery proximal 2 vessel runoff to the ankle. Veins: No focal venous abnormality. Review of the MIP  images confirms the above findings. NON-VASCULAR Adrenals/Urinary Tract: Visualized kidneys, ureter and bladder are all within normal limits. Stomach/Bowel: No focal bowel wall thickening or evidence of obstruction. Normal appendix. Lymphatic: No suspicious lymphadenopathy. Reproductive: Mild prostatomegaly. Other: Fat containing right inguinal hernia.  No ascites. Musculoskeletal: Multilevel degenerative disc disease. No acute fracture or malalignment. No lytic or blastic osseous lesion. IMPRESSION: VASCULAR 1. Severe right worse than left femoropopliteal disease as detailed above. 2. Partially imaged endovascular aortic repair with a bifurcated endovascular prosthesis. The iliac  limbs are widely patent and there is no significant inflow disease. 3. Patent 3 vessel runoff to the ankle right. 4. Short segment occlusion of the left anterior tibial artery with patent 2 vessel runoff to the ankle. 5. Mild ectasias a of the right common femoral artery likely at the site of prior endarterectomy. Maximal vessel diameter 1.4 cm. NON-VASCULAR 1. Fat containing right inguinal hernia. 2. Multilevel degenerative disc disease. Electronically Signed   By: Wilkie Lent M.D.   On: 02/06/2024 16:11     Labs:   Basic Metabolic Panel: Recent Labs  Lab 02/06/24 1121 02/07/24 0930 02/08/24 0519 02/08/24 0520  NA 137 142 139 140  K 4.3 3.9 3.7 3.7  CL 101 103 105 105  CO2 20* 24 26 27   GLUCOSE 253* 133* 176* 176*  BUN 24* 17 13 13   CREATININE 1.49* 1.16 1.07 1.13  CALCIUM  9.2 9.5 9.1 9.2  MG  --  1.1* 2.0  --   PHOS  --  3.3  --  3.4   GFR Estimated Creatinine Clearance: 61.9 mL/min (by C-G formula based on SCr of 1.13 mg/dL). Liver Function Tests: Recent Labs  Lab 02/06/24 1121 02/07/24 0930 02/08/24 0520  AST 17 15  --   ALT 12 10  --   ALKPHOS 56 63  --   BILITOT 1.1 1.0  --   PROT 7.5 7.6  --   ALBUMIN  3.1* 3.0* 2.7*   No results for input(s): LIPASE, AMYLASE in the last 168 hours. No results for input(s): AMMONIA in the last 168 hours. Coagulation profile Recent Labs  Lab 02/07/24 0930  INR 1.2    CBC: Recent Labs  Lab 02/06/24 1121 02/07/24 0930  WBC 10.8* 11.4*  NEUTROABS 9.3* 9.6*  HGB 11.8* 12.4*  HCT 36.4* 36.1*  MCV 102.8* 97.3  PLT 355 416*   Cardiac Enzymes: Recent Labs  Lab 02/10/24 0402  CKTOTAL 16*   BNP: Invalid input(s): POCBNP CBG: Recent Labs  Lab 02/09/24 1041 02/09/24 1714 02/09/24 2053 02/10/24 0601 02/10/24 1125  GLUCAP 199* 317* 180* 182* 231*   D-Dimer No results for input(s): DDIMER in the last 72 hours. Hgb A1c No results for input(s): HGBA1C in the last 72 hours. Lipid Profile Recent  Labs    02/10/24 0402  CHOL 101  HDL 25*  LDLCALC 47  TRIG 855  CHOLHDL 4.0   Thyroid  function studies No results for input(s): TSH, T4TOTAL, T3FREE, THYROIDAB in the last 72 hours.  Invalid input(s): FREET3 Anemia work up No results for input(s): VITAMINB12, FOLATE, FERRITIN, TIBC, IRON, RETICCTPCT in the last 72 hours. Microbiology Recent Results (from the past 240 hours)  Blood Cultures x 2 sites     Status: None (Preliminary result)   Collection Time: 02/06/24 11:20 AM   Specimen: BLOOD  Result Value Ref Range Status   Specimen Description BLOOD SITE NOT SPECIFIED  Final   Special Requests   Final    BOTTLES DRAWN AEROBIC AND ANAEROBIC  Blood Culture results may not be optimal due to an inadequate volume of blood received in culture bottles   Culture   Final    NO GROWTH 4 DAYS Performed at [facility], [address], [place], [place] [address]    Report Status PENDING  Incomplete  Blood Cultures x 2 sites     Status: None (Preliminary result)   Collection Time: 02/06/24 11:20 AM   Specimen: BLOOD  Result Value Ref Range Status   Specimen Description BLOOD SITE NOT SPECIFIED  Final   Special Requests   Final    BOTTLES DRAWN AEROBIC AND ANAEROBIC Blood Culture results may not be optimal due to an inadequate volume of blood received in culture bottles   Culture   Final    NO GROWTH 4 DAYS Performed at [facility], [address] [place], [place] [address]    Report Status PENDING  Incomplete    Time coordinating discharge: 45 minutes  Signed: Larry Alcock  [facility] 02/10/2024, 1:38 PM

## 2024-02-10 NOTE — Progress Notes (Signed)
 PODIATRY PROGRESS NOTE Patient Name: Luke [name]  DOB 30-Dec-1945 DOA 02/06/2024  Hospital Day: 5  Assessment:  78 y.o. male with PMHx significant for  coronary artery disease idiopathic pulmonary fibrosis, PAD, heart failure, ICD and DM2 with concern for possible chronic multifocal osteomyelitis seen on CT including the first metatarsal head first proximal phalanx, phalanges of the 3rd and 4th toes and the fourth metatarsal head.   WBC  11.4 on 10/4 ESR/CRP:  73 / 5  CT right foot without contrast: 1. Active osteomyelitis involving the head of the 1st metatarsal, base of the proximal phalanx of the great toe, phalanges of the third and fourth toes, and head of the fourth metatarsal. 2. Oblique fracture through the distal metaphysis of the 4th metatarsal. 3. Possible sesamoiditis or osteomyelitis of the 1st digit sesamoids.  Plan:  - Unfortunately still has significant flow-limiting stenosis right lower extremity  after angiogram, being considered for bypass.  Appreciate vascular surgery recommendations and assistance. -Given stability of the right foot with chronic osteomyelitis I recommend outpatient follow-up.  This is also what the patient prefers at this time.  Surgical intervention in the future would involve right foot transmetatarsal amputation should he have improved vascularity to the right foot or it become necessary to proceed regardless. - Recommend infectious disease consultation, appreciate recommendations - Anticoagulation: Okay to continue per primary/vascular - Wound care: Betadine paint to the fourth toe right foot - WB status: Weightbearing as tolerated in postop shoe -Patient is stable for discharge from my perspective when antibiotic therapy is decided upon.  Follow-up in the office in approximately 1 month.  Will sign off.        Marolyn JULIANNA Honour, DPM [facility]    Subjective:  Discussed with the patient this morning he does not want surgery on  the right foot.  We discussed this would be high risk regardless given the blood flow situation as it is.  He is being considered for possible bypass but this will be considered as an outpatient.  I discussed that we will follow him as an outpatient as well.  He is in agreement with plan hopeful for discharge today.  Discussed infection disease doctor to see him.  Objective:   Vitals:   02/09/24 2323 02/10/24 0749  BP: 110/63   Pulse: 77 78  Resp: 20 14  Temp: 97.9 F (36.6 C)   SpO2: 92% 93%       Latest Ref Rng & Units 02/07/2024    9:30 AM 02/06/2024   11:21 AM 10/10/2023   10:40 AM  CBC  WBC 4.0 - 10.5 K/uL 11.4  10.8    Hemoglobin 13.0 - 17.0 g/dL 87.5  88.1  88.0   Hematocrit 39.0 - 52.0 % 36.1  36.4  35.0   Platelets 150 - 400 K/uL 416  355         Latest Ref Rng & Units 02/08/2024    5:20 AM 02/08/2024    5:19 AM 02/07/2024    9:30 AM  BMP  Glucose 70 - 99 mg/dL 823  823  866   BUN 8 - 23 mg/dL 13  13  17    Creatinine 0.61 - 1.24 mg/dL 8.86  8.92  8.83   Sodium 135 - 145 mmol/L 140  139  142   Potassium 3.5 - 5.1 mmol/L 3.7  3.7  3.9   Chloride 98 - 111 mmol/L 105  105  103   CO2 22 -  32 mmol/L 27  26  24    Calcium  8.9 - 10.3 mg/dL 9.2  9.1  9.5     General: AAOx3, NAD  Lower Extremity Exam  No change from prior, no significant erythema edema or any evidence of drainage on right foot, stable.   DP and PT pulses nonpalpable   Small superficial ulceration of the distal aspect of the fourth toe on the right foot.  Does have very mild erythema and edema of the 3rd and 4th toes.   Sensation significantly diminished to light touch secondary to neuropathy   Deformity of the hallux with shortening of the hallux in comparison relative the second toe which is elongated significantly compared to the remaining digits.  This is related to the bone loss within the 1st, 3rd and 4th toes   Radiology:  Results reviewed. See assessment for pertinent imaging results

## 2024-02-10 NOTE — Progress Notes (Incomplete)
 Vanc levels vs dapto?

## 2024-02-10 NOTE — TOC Transition Note (Signed)
 Transition of Care (TOC) - Discharge Note Rayfield Gobble RN, BSN Inpatient Care Management Unit 4E- RN Case Manager See Treatment Team for direct phone #   Patient Details  Name: Luke [name] MRN: [id number] Date of Birth: 1945/09/02  Transition of Care [facility]) CM/SW Contact:  Gobble Rayfield Hurst, RN Phone Number: 02/10/2024, 2:12 PM   Clinical Narrative:    Per MD pt stable for transition home, will need home IV abx- OPAT has been placed per ID- end date 03/19/24.  PICC line has been ordered and pending placement this afternoon.   CM in to speak with pt, wife also present at the bedside. Pt is eager to go home. Discussed HH needs for home IV abx, pt voiced he does not have preference for home infusion and is agreeable to ID referral made to [facility] pharmacy. Liaison Pam will come to bedside to provide education this afternoon.  List provided for [facility] choice Per CMS guidelines from [employer]pl website with star ratings (copy placed in shadow chart)- pt has used HH in past- thinks it was [place]- per review in [facility]- confirmed [facility] serviced pt in 2024. Pt voiced he is agreeable to [facility], [facility], or [facility] - whoever is better for cost savings regarding supplies.   Address, phone # and PCP all confirmed, Wife plans to transport home- however does not drive at night so is hopeful to be able to leave before it gets dark.   Per discussion with Pam for coordinating home infusion- [facility] does not cover supplies for home infusion- both [facility] and [facility] do- CM has reached out to [facility] and referral has been accepted with plan for start of care visit tomorrow.   IP CM interventions have been completed no further needs noted.   Final next level of care: Home w Home Health Services Barriers to Discharge: Other (must enter comment) (PICC line pending placement, will need home IV ABX education, and evening does of abx prior to leaving)   Patient Goals  and CMS Choice Patient states their goals for this hospitalization and ongoing recovery are:: return home and recover   Choice offered to / list presented to : Patient, Spouse      Discharge Placement                 Home w/ [facility]      Discharge Plan and Services Additional resources added to the After Visit Summary for     Discharge Planning Services: CM Consult Post Acute Care Choice: Home Health          DME Arranged: N/A DME Agency: NA       HH Arranged: RN, IV Antibiotics HH Agency: [facility], [facility] Date [facility] Agency Contacted: 02/10/24 Time HH Agency Contacted: 1412 Representative spoke with at [facility] Agency: Pam/Cory  Social Drivers of Health (SDOH) Interventions SDOH Screenings   Food Insecurity: No Food Insecurity (02/06/2024)  Housing: Low Risk  (02/06/2024)  Transportation Needs: No Transportation Needs (02/06/2024)  Utilities: Not At Risk (02/06/2024)  Alcohol Screen: Low Risk  (10/02/2023)  Depression (PHQ2-9): Low Risk  (10/06/2023)  Financial Resource Strain: Low Risk  (10/02/2023)  Physical Activity: Sufficiently Active (10/02/2023)  Social Connections: Moderately Integrated (02/06/2024)  Stress: No Stress Concern Present (10/02/2023)  Tobacco Use: Medium Risk (02/04/2024)  Health Literacy: Adequate Health Literacy (04/24/2023)     Readmission Risk Interventions    02/10/2024    2:12 PM 11/21/2022   12:07 PM 11/01/2022    2:20 PM  Readmission Risk Prevention Plan  Post Dischage Appt Complete  Complete  Medication Screening Complete  Complete  Transportation Screening Complete Complete Complete  PCP or Specialist Appt within 5-7 Days  Complete   Home Care Screening  Complete   Medication Review (RN CM)  Complete

## 2024-02-10 NOTE — Plan of Care (Signed)
  Problem: Education: Goal: Knowledge of General Education information will improve Description: Including pain rating scale, medication(s)/side effects and non-pharmacologic comfort measures Outcome: Progressing   Problem: Health Behavior/Discharge Planning: Goal: Ability to manage health-related needs will improve Outcome: Progressing   Problem: Clinical Measurements: Goal: Ability to maintain clinical measurements within normal limits will improve Outcome: Progressing Goal: Cardiovascular complication will be avoided Outcome: Progressing   Problem: Activity: Goal: Risk for activity intolerance will decrease Outcome: Progressing   

## 2024-02-10 NOTE — Progress Notes (Signed)
 PHARMACY CONSULT NOTE FOR:  OUTPATIENT  PARENTERAL ANTIBIOTIC THERAPY (OPAT)  Indication: R-foot osteo Regimen: Daptomycin 700 mg IV every 24 hours and Cefepime 2g IV every 8 hours End date: 03/19/24  IV antibiotic discharge orders are pended. To discharging provider:  please sign these orders via discharge navigator,  Select New Orders & click on the button choice - Manage This Unsigned Work.     Thank you for allowing pharmacy to be a part of this patient's care.  Almarie Lunger, PharmD, BCPS, BCIDP Infectious Diseases Clinical Pharmacist 02/10/2024 12:13 PM   **Pharmacist phone directory can now be found on [email or web address] ([email or web address]).  Listed under [facility].

## 2024-02-11 ENCOUNTER — Telehealth: Payer: Self-pay

## 2024-02-11 ENCOUNTER — Ambulatory Visit: Payer: Self-pay | Admitting: Family Medicine

## 2024-02-11 ENCOUNTER — Telehealth: Payer: Self-pay | Admitting: *Deleted

## 2024-02-11 ENCOUNTER — Ambulatory Visit: Admitting: Podiatry

## 2024-02-11 DIAGNOSIS — I4891 Unspecified atrial fibrillation: Secondary | ICD-10-CM | POA: Diagnosis not present

## 2024-02-11 DIAGNOSIS — S92341D Displaced fracture of fourth metatarsal bone, right foot, subsequent encounter for fracture with routine healing: Secondary | ICD-10-CM | POA: Diagnosis not present

## 2024-02-11 DIAGNOSIS — M86671 Other chronic osteomyelitis, right ankle and foot: Secondary | ICD-10-CM | POA: Diagnosis not present

## 2024-02-11 DIAGNOSIS — E1151 Type 2 diabetes mellitus with diabetic peripheral angiopathy without gangrene: Secondary | ICD-10-CM | POA: Diagnosis not present

## 2024-02-11 DIAGNOSIS — I088 Other rheumatic multiple valve diseases: Secondary | ICD-10-CM | POA: Diagnosis not present

## 2024-02-11 DIAGNOSIS — L97519 Non-pressure chronic ulcer of other part of right foot with unspecified severity: Secondary | ICD-10-CM | POA: Diagnosis not present

## 2024-02-11 DIAGNOSIS — K4090 Unilateral inguinal hernia, without obstruction or gangrene, not specified as recurrent: Secondary | ICD-10-CM | POA: Diagnosis not present

## 2024-02-11 DIAGNOSIS — N179 Acute kidney failure, unspecified: Secondary | ICD-10-CM | POA: Diagnosis not present

## 2024-02-11 DIAGNOSIS — J84112 Idiopathic pulmonary fibrosis: Secondary | ICD-10-CM | POA: Diagnosis not present

## 2024-02-11 DIAGNOSIS — Z7982 Long term (current) use of aspirin: Secondary | ICD-10-CM | POA: Diagnosis not present

## 2024-02-11 DIAGNOSIS — I509 Heart failure, unspecified: Secondary | ICD-10-CM | POA: Diagnosis not present

## 2024-02-11 DIAGNOSIS — E1169 Type 2 diabetes mellitus with other specified complication: Secondary | ICD-10-CM | POA: Diagnosis not present

## 2024-02-11 DIAGNOSIS — E785 Hyperlipidemia, unspecified: Secondary | ICD-10-CM | POA: Diagnosis not present

## 2024-02-11 DIAGNOSIS — I130 Hypertensive heart and chronic kidney disease with heart failure and stage 1 through stage 4 chronic kidney disease, or unspecified chronic kidney disease: Secondary | ICD-10-CM | POA: Diagnosis not present

## 2024-02-11 DIAGNOSIS — I442 Atrioventricular block, complete: Secondary | ICD-10-CM | POA: Diagnosis not present

## 2024-02-11 DIAGNOSIS — Z792 Long term (current) use of antibiotics: Secondary | ICD-10-CM | POA: Diagnosis not present

## 2024-02-11 DIAGNOSIS — E1122 Type 2 diabetes mellitus with diabetic chronic kidney disease: Secondary | ICD-10-CM | POA: Diagnosis not present

## 2024-02-11 DIAGNOSIS — I2510 Atherosclerotic heart disease of native coronary artery without angina pectoris: Secondary | ICD-10-CM | POA: Diagnosis not present

## 2024-02-11 DIAGNOSIS — K429 Umbilical hernia without obstruction or gangrene: Secondary | ICD-10-CM | POA: Diagnosis not present

## 2024-02-11 DIAGNOSIS — E11621 Type 2 diabetes mellitus with foot ulcer: Secondary | ICD-10-CM | POA: Diagnosis not present

## 2024-02-11 DIAGNOSIS — N183 Chronic kidney disease, stage 3 unspecified: Secondary | ICD-10-CM | POA: Diagnosis not present

## 2024-02-11 DIAGNOSIS — I255 Ischemic cardiomyopathy: Secondary | ICD-10-CM | POA: Diagnosis not present

## 2024-02-11 DIAGNOSIS — K219 Gastro-esophageal reflux disease without esophagitis: Secondary | ICD-10-CM | POA: Diagnosis not present

## 2024-02-11 DIAGNOSIS — I714 Abdominal aortic aneurysm, without rupture, unspecified: Secondary | ICD-10-CM | POA: Diagnosis not present

## 2024-02-11 DIAGNOSIS — Z452 Encounter for adjustment and management of vascular access device: Secondary | ICD-10-CM | POA: Diagnosis not present

## 2024-02-11 LAB — CULTURE, BLOOD (ROUTINE X 2)
Culture: NO GROWTH
Culture: NO GROWTH

## 2024-02-11 NOTE — Telephone Encounter (Signed)
 Patient was discharged from hospital on 02/10/24. Per MAR, he was given Tyvaso DPI 64mcg dose while inpatient using personal supply.  Called patient to discuss Tyvaso dose and tolerability given we received fax from [facility] requesting updated script. He reports persistent side effects on Tyvaso DPI 64mcg dose, but reports no side effects on 48 mcg dose.   Called [facility] to authorize script for 48 mcg dose of Tyvaso DPI as this was max tolerated target dose.   Patient aware that script for Tyvaso DPI 48 mcg dose was authorized due to adverse effects on Tyvaso DPI 64mcg (diarrhea, headache).   Aleck Puls, PharmD, BCPS, CPP Clinical Pharmacist  [facility]

## 2024-02-11 NOTE — Telephone Encounter (Signed)
 Received call from Luke [name] with [facility] requesting verbal order for specialized nursing visits x7 for picc care/ labs. Provided verbal order. Lorenda CHRISTELLA Code, RMA

## 2024-02-11 NOTE — Transitions of Care (Post Inpatient/ED Visit) (Addendum)
 02/11/2024  Name: Luke [name] MRN: [id number] DOB: 1945-11-14  Today's TOC FU Call Status: Today's TOC FU Call Status:: Successful TOC FU Call Completed TOC FU Call Complete Date: 02/11/24 Patient's Name and Date of Birth confirmed.  Transition Care Management Follow-up Telephone Call Date of Discharge: 02/10/24 Discharge Facility: [facility]) Type of Discharge: Inpatient Admission Primary Inpatient Discharge Diagnosis:: Acute osteomyelitis of right foot How have you been since you were released from the hospital?: Better Any questions or concerns?: No  Items Reviewed: Did you receive and understand the discharge instructions provided?: Yes Medications obtained,verified, and reconciled?: Yes (Medications Reviewed) Any new allergies since your discharge?: No Dietary orders reviewed?: No Do you have support at home?: Yes People in Home [RPT]: spouse Name of Support/Comfort Primary Source: Avelina  Medications Reviewed Today: Medications Reviewed Today     Reviewed by Kennieth Cathlean DEL, RN (Case Manager) on 02/11/24 at 1145  Med List Status: <None>   Medication Order Taking? Sig Documenting Provider Last Dose Status Informant  aspirin  EC 81 MG tablet [id number] Yes Take 1 tablet (81 mg total) by mouth daily. Swallow whole. Arlice Reichert, MD  Active   carvedilol  (COREG ) 25 MG tablet [id number] Yes TAKE 1 TABLET BY MOUTH TWICE DAILY WITH MEALS Jeffrie Oneil BROCKS, MD  Active Self, Pharmacy Records  ceFEPime (MAXIPIME) IVPB [id number] Yes Inject 2 g into the vein every 8 (eight) hours. Indication:  R-foot osteo First Dose: Yes Last Day of Therapy:  03/19/24 Labs - Once weekly:  CBC/D and BMP, Labs - Once weekly: ESR and CRP Method of administration: IV Push Method of administration may be changed at the discretion of home infusion pharmacist based upon assessment of the patient and/or caregiver's ability to self-administer the medication ordered. Dahal, Binaya, MD  Active    Cholecalciferol  (VITAMIN D3) 50 MCG (2000 UT) TABS [id number] Yes Take 2,000 Units by mouth in the morning. [provider]  Active Self, Pharmacy Records  Cyanocobalamin  (VITAMIN B-12 PO) [id number] Yes Take 2,000 mcg by mouth in the morning. [provider]  Active Self, Pharmacy Records           Med Note CARMIN, LINDA   Tue Sep 11, 2021 10:57 AM)    daptomycin (CUBICIN) IVPB [id number] Yes Inject 700 mg into the vein daily. Indication:  R-foot osteo First Dose: Yes Last Day of Therapy:  03/19/24 Labs - Once weekly:  CBC/D, BMP, and CPK Labs - Once weekly: ESR and CRP Method of administration: IV Push Method of administration may be changed at the discretion of home infusion pharmacist based upon assessment of the patient and/or caregiver's ability to self-administer the medication ordered. Arlice Reichert, MD  Active   glipiZIDE  (GLUCOTROL ) 5 MG tablet [id number] Yes Take 2 tablets (10 mg total) by mouth daily. With supper. Cleatus Arlyss RAMAN, MD  Active Self, Pharmacy Records  glucose blood (ACCU-CHEK AVIVA PLUS) test strip [id number] Yes USE AS DIRECTED TO TEST BLOOD SUGAR TWICE DAILY Cleatus Arlyss RAMAN, MD  Active Self, Pharmacy Records  insulin  glargine (LANTUS  SOLOSTAR) 100 UNIT/ML Solostar Pen [id number] Yes INJECT 0.3 TO 0.35 MLS(30 TO 35 UNITS) INTO THE SKIN EVERY DAY Cleatus Arlyss RAMAN, MD  Active Self, Pharmacy Records           Med Note (CRUTHIS, CHLOE C   Fri Feb 06, 2024  2:18 PM)    metFORMIN  (GLUCOPHAGE ) 500 MG tablet [id number] Yes TAKE 2 TABLETS BY MOUTH TWICE DAILY WITH FOOD Cleatus Arlyss RAMAN, MD  Active Self, Pharmacy Records  Multiple Vitamin (MULTIVITAMIN WITH MINERALS) TABS tablet [id number] Yes Take 1 tablet by mouth in the morning. [provider]  Active Self, Pharmacy Records  omeprazole  (PRILOSEC) 40 MG capsule [id number] Yes TAKE 1 CAPSULE(40 MG) BY MOUTH DAILY  Patient taking differently: Take 40 mg by mouth every evening.   Nandigam, Kavitha V, MD   Active Self, Pharmacy Records  prasugrel  (EFFIENT ) 10 MG TABS tablet [id number] Yes Take 0.5 tablets (5 mg total) by mouth in the morning and at bedtime. Cleatus Arlyss RAMAN, MD  Active Self, Pharmacy Records  simvastatin  (ZOCOR ) 20 MG tablet [id number]  TAKE 1 TABLET(20 MG) BY MOUTH AT BEDTIME Walker, Caitlin S, NP  Active Self, Pharmacy Records  torsemide  (DEMADEX ) 20 MG tablet [id number] Yes TAKE 1 TABLET(20 MG) BY MOUTH DAILY Jeffrie Oneil BROCKS, MD  Active Self, Pharmacy Records  Treprostinil (TYVASO DPI MAINTENANCE KIT) 64 MCG POWD [id number] Yes Inhale 1 puff into the lungs in the morning, at noon, in the evening, and at bedtime. [provider]  Active Self, Pharmacy Records  triamcinolone  ointment (KENALOG ) 0.1 % [id number] Yes Apply topically 2 (two) times daily.  Patient taking differently: Apply 1 Application topically daily as needed (skin issues.).   Iva Marty Saltness, MD  Active Self, Pharmacy Records  valsartan  (DIOVAN ) 40 MG tablet [id number] Yes Take 1 tablet (40 mg total) by mouth daily with supper. Jeffrie Oneil BROCKS, MD  Active Self, Pharmacy Records            Home Care and Equipment/Supplies: Were Home Health Services Ordered?: Yes Name of Home Health Agency:: [facility] Has Agency set up a time to come to your home?: Yes First Home Health Visit Date: 02/11/24 Any new equipment or medical supplies ordered?: Yes Name of Medical supply agency?: [facility] Were you able to get the equipment/medical supplies?: Yes  Functional Questionnaire: Do you need assistance with bathing/showering or dressing?: No Do you need assistance with meal preparation?: No Do you need assistance with eating?: No Do you have difficulty maintaining continence: No Do you need assistance with getting out of bed/getting out of a chair/moving?: No Do you have difficulty managing or taking your medications?: No  Follow up appointments reviewed: PCP Follow-up appointment confirmed?: No MD Provider Line  [phone] Given: Yes (Patient is going to call for an appointment) Specialist Hospital Follow-up appointment confirmed?: Yes Date of Specialist follow-up appointment?: 03/02/24 Follow-Up Specialty Provider:: [id number] Infectious disease/ Do you need transportation to your follow-up appointment?: No Do you understand care options if your condition(s) worsen?: Yes-patient verbalized understanding  SDOH Interventions Today    Flowsheet Row Most Recent Value  SDOH Interventions   Food Insecurity Interventions Intervention Not Indicated  Housing Interventions Intervention Not Indicated  Transportation Interventions Intervention Not Indicated, Patient Resources (Friends/Family)  Utilities Interventions Intervention Not Indicated   Discussed and offered 30 day TOC program.  Patient declined.  The patient has been provided with contact information for the care management team and has been advised to call with any health -related questions or concerns.  The patient verbalized understanding with current plan of care.  The patient is directed to their insurance card regarding availability of benefits coverage. Cathlean Headland BSN RN [facility] Health Care Management Coordinator [email or web address] Direct Dial: [phone]  Fax: [phone] Website: [email or web address]

## 2024-02-12 ENCOUNTER — Other Ambulatory Visit: Payer: Self-pay | Admitting: Family Medicine

## 2024-02-12 DIAGNOSIS — I5022 Chronic systolic (congestive) heart failure: Secondary | ICD-10-CM

## 2024-02-13 ENCOUNTER — Telehealth: Payer: Self-pay

## 2024-02-13 NOTE — Telephone Encounter (Signed)
 Please check medication fridge to see if Lantus  has been delivered and contact patient.     Copied from CRM [id number]. Topic: Clinical - Medication Question >> Feb 12, 2024  1:16 PM Luke [name] HERO wrote: Medication: insulin  glargine (LANTUS  SOLOSTAR) 100 UNIT/ML Solostar Pen  Patient is calling to see if his refill was in the office. He hasn't heard anything about it and concerned.

## 2024-02-13 NOTE — Telephone Encounter (Signed)
 Checked the fridge and we do not have any medication for the pt at this time. Pt has been made aware.

## 2024-02-16 ENCOUNTER — Ambulatory Visit (INDEPENDENT_AMBULATORY_CARE_PROVIDER_SITE_OTHER): Admitting: Family Medicine

## 2024-02-16 ENCOUNTER — Encounter: Payer: Self-pay | Admitting: Family Medicine

## 2024-02-16 VITALS — BP 136/84 | HR 73 | Temp 97.2°F | Ht 73.0 in | Wt 176.0 lb

## 2024-02-16 DIAGNOSIS — M869 Osteomyelitis, unspecified: Secondary | ICD-10-CM

## 2024-02-16 MED ORDER — TORSEMIDE 20 MG PO TABS: 20.0000 mg | ORAL_TABLET | Freq: Every day | ORAL | Status: DC | PRN

## 2024-02-16 NOTE — Patient Instructions (Signed)
 Go to the lab on the way out.   If you have mychart we'll likely use that to update you.    Take care.  Glad to see you. I'll check with the ID clinic.

## 2024-02-16 NOTE — Progress Notes (Unsigned)
 Inpatient f/u.   Admit date: 02/06/2024 Discharge date: 02/10/2024   Admitted from: Home Discharge disposition: Home with home health nurse   Recommendations at discharge:  Complete 6 weeks course of IV antibiotics at home. Hold statin while on IV daptomycin because of the risk of rhabdomyolysis. Needs follow-up labs with ID Follow-up with podiatry and ID as an outpatient.    Brief narrative: Luke [name] is a 78 y.o. male with PMH significant for DM2, HTN, HLD, CAD, ischemic cardiomyopathy s/p AICD, A-fib, abdominal aortic aneurysm, CKD, PAD s/p endarterectomies, idiopathic pulmonary fibrosis on 2 L oxygen  at home. 10/1, patient was seen at podiatry office for swelling and drainage from chronic right foot wound.   CT scan of right foot was obtained which showed acute osteomyelitis involving the head of the first metatarsal base of the proximal phalanx, oblique fracture to the distal metaphysis of the fourth metatarsal. With abnormal CT scan report, patient was directed to ED on 10/3.   Hemodynamically stable Started on broad-spectrum IV antibiotics Admitted to [facility] Vascular surgery and podiatry were consulted 10/6, underwent right lower extremity angiogram     Hospital course: Right foot osteomyelitis to multiple sites and digits Lactic acidosis CT right foot revealed osteomyelitis involving head of the first metatarsal, base of the proximal phalanx of the great toe, phalanges of the 3rd and 4th toes, and head of the fourth metatarsal. Podiatry recommended surgical intervention but it seems patient does not want it and try to control with antibiotics.  ID consultation was obtained.. WBC count remains a little elevated but lactic acid level has normalized Switched antibiotics to IV cefepime and IV daptomycin. Recommended for total of 6 weeks. To follow-up with ID and podiatry as an outpatient.  Needs follow-up labs with ID Per podiatry, weightbearing as tolerated in surgical  shoe Continue pain control Tylenol  as needed Last Labs         Recent Labs  Lab 02/06/24 1121 02/06/24 1131 02/06/24 1933 02/06/24 2034 02/07/24 0930  WBC 10.8*  --   --   --  11.4*  LATICACIDVEN  --  3.2* 1.5 1.2  --       PAD s/p prior endarterectomies Abdominal aortic aneurysm Vascular surgery following 10/6, underwent right lower extremity angiogram.  Angiogram showed occluded SFA, TP trunk.  Per vascular surgery, patient is not an endovascular candidate and would require common femoral to posterior tibial artery bypass if unable to heal toe wounds.  Patient will follow-up with vascular surgery as an outpatient PTA meds- Effient , simvastatin .  Continue Effient   Hold statin while on IV daptomycin because of the risk of rhabdomyolysis      AKI Baseline creatinine less than 1.2.  Presented with creatinine of 1.49. Improved.  Continue to monitor    Recent Labs (within last 365 days)          Recent Labs    04/24/23 1406 10/06/23 1337 02/06/24 1121 02/07/24 0930 02/08/24 0519 02/08/24 0520  BUN 37* 35* 24* 17 13 13   CREATININE 1.33* 1.47 1.49* 1.16 1.07 1.13  CO2 22 28 20* 24 26 27       Hypomagnesemia Magnesium  level is low at 1.1.  Improved with replacement. Last Labs        Recent Labs  Lab 02/06/24 1121 02/07/24 0930 02/08/24 0519 02/08/24 0520  K 4.3 3.9 3.7 3.7  MG  --  1.1* 2.0  --   PHOS  --  3.3  --  3.4  Type 2 diabetes mellitus uncontrolled with hyperglycemia A1c 7.8 on 02/06/2024 PTA meds-Lantus  30 units nightly, glipizide  10 mg daily, metformin  1000 mg twice daily, Diabetes care coordinator consult appreciated. Continue previous regimen postdischarge Last Labs         Recent Labs  Lab 02/09/24 1041 02/09/24 1714 02/09/24 2053 02/10/24 0601 02/10/24 1125  GLUCAP 199* 317* 180* 182* 231*      Hypertension H/o ischemic cardiomyopathy s/p AICD Blood pressure controlled currently PTA meds- Coreg  25 mg twice daily, valsartan  40 mg  daily, torsemide  20 mg daily.   Continue Coreg  and irbesartan .  Torsemide  on hold.  Can resume postdischarge   A-fib Controlled on Coreg    Idiopathic pulmonary fibrosis  Continue 2 L oxygen  at home. =================================== D/w pt about flu shot- not done yet.  I need ID clinic input.    Minimal distal scabbing on the R4th toe.  No R foot erythema.  Minimal R ankle edema.  No fevers.  R arm PICC line intact.     Weekly CBC, CMP, ESR, CRP, CK levels while the patient is on antibiotics- labs need to go to ID clinic.   He needs help with insulin  delivery- Sanofi.    Rrr Ctab

## 2024-02-17 ENCOUNTER — Other Ambulatory Visit: Payer: Self-pay | Admitting: Cardiology

## 2024-02-17 ENCOUNTER — Other Ambulatory Visit: Payer: Self-pay | Admitting: Family Medicine

## 2024-02-17 ENCOUNTER — Telehealth: Payer: Self-pay

## 2024-02-17 DIAGNOSIS — E11621 Type 2 diabetes mellitus with foot ulcer: Secondary | ICD-10-CM | POA: Diagnosis not present

## 2024-02-17 DIAGNOSIS — E1151 Type 2 diabetes mellitus with diabetic peripheral angiopathy without gangrene: Secondary | ICD-10-CM | POA: Diagnosis not present

## 2024-02-17 DIAGNOSIS — M86671 Other chronic osteomyelitis, right ankle and foot: Secondary | ICD-10-CM | POA: Diagnosis not present

## 2024-02-17 DIAGNOSIS — E1169 Type 2 diabetes mellitus with other specified complication: Secondary | ICD-10-CM | POA: Diagnosis not present

## 2024-02-17 DIAGNOSIS — L97519 Non-pressure chronic ulcer of other part of right foot with unspecified severity: Secondary | ICD-10-CM | POA: Diagnosis not present

## 2024-02-17 DIAGNOSIS — S92341D Displaced fracture of fourth metatarsal bone, right foot, subsequent encounter for fracture with routine healing: Secondary | ICD-10-CM | POA: Diagnosis not present

## 2024-02-17 LAB — CBC WITH DIFFERENTIAL/PLATELET
Basophils Absolute: 0.1 K/uL (ref 0.0–0.1)
Basophils Relative: 1 % (ref 0.0–3.0)
Eosinophils Absolute: 0.5 K/uL (ref 0.0–0.7)
Eosinophils Relative: 4.8 % (ref 0.0–5.0)
HCT: 38.2 % — ABNORMAL LOW (ref 39.0–52.0)
Hemoglobin: 12.9 g/dL — ABNORMAL LOW (ref 13.0–17.0)
Lymphocytes Relative: 8.7 % — ABNORMAL LOW (ref 12.0–46.0)
Lymphs Abs: 1 K/uL (ref 0.7–4.0)
MCHC: 33.8 g/dL (ref 30.0–36.0)
MCV: 99.3 fl (ref 78.0–100.0)
Monocytes Absolute: 0.8 K/uL (ref 0.1–1.0)
Monocytes Relative: 7.7 % (ref 3.0–12.0)
Neutro Abs: 8.5 K/uL — ABNORMAL HIGH (ref 1.4–7.7)
Neutrophils Relative %: 77.8 % — ABNORMAL HIGH (ref 43.0–77.0)
Platelets: 316 K/uL (ref 150.0–400.0)
RBC: 3.84 Mil/uL — ABNORMAL LOW (ref 4.22–5.81)
RDW: 13 % (ref 11.5–15.5)
WBC: 11 K/uL — ABNORMAL HIGH (ref 4.0–10.5)

## 2024-02-17 LAB — C-REACTIVE PROTEIN: CRP: 0.6 mg/dL (ref 0.5–20.0)

## 2024-02-17 LAB — COMPREHENSIVE METABOLIC PANEL WITH GFR
ALT: 13 U/L (ref 0–53)
AST: 14 U/L (ref 0–37)
Albumin: 4.1 g/dL (ref 3.5–5.2)
Alkaline Phosphatase: 63 U/L (ref 39–117)
BUN: 42 mg/dL — ABNORMAL HIGH (ref 6–23)
CO2: 24 meq/L (ref 19–32)
Calcium: 9.7 mg/dL (ref 8.4–10.5)
Chloride: 96 meq/L (ref 96–112)
Creatinine, Ser: 1.56 mg/dL — ABNORMAL HIGH (ref 0.40–1.50)
GFR: 42.62 mL/min — ABNORMAL LOW (ref >60.00)
Glucose, Bld: 200 mg/dL — ABNORMAL HIGH (ref 70–99)
Potassium: 5.4 meq/L — ABNORMAL HIGH (ref 3.5–5.1)
Sodium: 130 meq/L — ABNORMAL LOW (ref 135–145)
Total Bilirubin: 0.6 mg/dL (ref 0.2–1.2)
Total Protein: 7.8 g/dL (ref 6.0–8.3)

## 2024-02-17 LAB — SEDIMENTATION RATE: Sed Rate: 35 mm/h — ABNORMAL HIGH (ref 0–20)

## 2024-02-17 LAB — CK: Total CK: 30 U/L (ref 17–232)

## 2024-02-17 NOTE — Progress Notes (Signed)
 Complex Care Management Note  Care Guide Note 02/17/2024 Name: Luke [name] MRN: [id number] DOB: April 27, 1946  Luke [name] is a 78 y.o. year old male who sees Cleatus Arlyss RAMAN, MD for primary care. I reached out to Luke [name] by phone today to offer complex care management services.  [name] was given information about Complex Care Management services today including:   The Complex Care Management services include support from the care team which includes your Nurse Care Manager, Clinical Social Worker, or Pharmacist.  The Complex Care Management team is here to help remove barriers to the health concerns and goals most important to you. Complex Care Management services are voluntary, and the patient may decline or stop services at any time by request to their care team member.   Complex Care Management Consent Status: Patient agreed to services and verbal consent obtained.   Follow up plan:  Telephone appointment with complex care management team member scheduled for:  02/18/24 at 3:00 p.m.   Encounter Outcome:  Patient Scheduled  D[facility]  [facility], [facility] VBCI Assistant Direct Dial: [phone]  Fax: [phone]

## 2024-02-18 ENCOUNTER — Other Ambulatory Visit (INDEPENDENT_AMBULATORY_CARE_PROVIDER_SITE_OTHER): Admitting: Pharmacist

## 2024-02-18 DIAGNOSIS — Z794 Long term (current) use of insulin: Secondary | ICD-10-CM

## 2024-02-18 DIAGNOSIS — E785 Hyperlipidemia, unspecified: Secondary | ICD-10-CM

## 2024-02-18 DIAGNOSIS — E1169 Type 2 diabetes mellitus with other specified complication: Secondary | ICD-10-CM

## 2024-02-18 NOTE — Patient Instructions (Addendum)
Mr. Luke [name],   It was a pleasure to speak with you today! As we discussed:?   We can re-enroll for [employer]ofi starting 03/06/24 Or we can submit and application to a different program and hope they are more reliable with shipments.   I also called and spoke to [employer] and they confirm current order is in process as you mentioned, delayed due to back order.  I tried to confirm why no shipment was sent in July and they admit you should have received one. May have been a Radio producer.   Luckily, there are two programs that offer long-acting insulin  at the same income-limit as the Sanofi program so I would expect you to qualify.  Both of these programs also have online applications (which we could do for you) meaning you wouldn't have to come into office to sign the enrollment form.   I will share this with Dr. Cleatus and can provide you with more program details.   Please reach out prior to your next scheduled appointment should you have any questions or concerns.  You may reach me via MyChart Message, or you may leave me a voicemail at [phone] and I will get back to you shortly.   Thank you!   Future Appointments  Date Time Provider Department Center  02/23/2024  3:30 PM Parrett, Madelin RAMAN, NP [facility] [id number]  02/25/2024  1:45 PM Janit Thresa HERO, DPM [facility]  03/02/2024  3:45 PM Vu, Constance DASEN, MD [facility]  03/08/2024  7:25 AM [facility] DEVICE REMOTES [facility] H&V  04/03/2024  1:40 PM Sheree Penne Bruckner, MD [facility] H&V  04/21/2024  7:00 AM [facility] HVT DEVICE REMOTES [facility] H&V  04/27/2024  3:40 PM [facility] ANNUAL WELLNESS VISIT 1 [facility] [address]  05/12/2024  2:00 PM [facility] 1 [facility] H&V  05/12/2024  2:45 PM [facility] PA-2 [facility] H&V  06/03/2024  2:30 PM Iva Marty Saltness, MD [facility] None  07/21/2024  7:00 AM [facility] HVT DEVICE REMOTES [facility] H&V  10/20/2024  7:00 AM [facility] HVT DEVICE REMOTES [facility] H&V  01/19/2025  7:00 AM CVD HVT DEVICE  REMOTES [facility] H&V  04/20/2025  7:00 AM [facility] HVT DEVICE REMOTES [facility] H&V  07/20/2025  7:00 AM CVD HVT DEVICE REMOTES [facility] H&V    Manuelita FABIENE Kobs, PharmD Clinical Pharmacist [facility] [phone]

## 2024-02-18 NOTE — Progress Notes (Signed)
   02/18/2024 Name: Luke [name] MRN: [id number] DOB: [birth year]  Subjective  Chief Complaint  Patient presents with   Diabetes   Medication Access   Care Team: Primary Care Provider: Cleatus Arlyss RAMAN, MD  Reason for visit: ?  Luke [name] is a 78 y.o. male who presents today for a telephone visit with the pharmacist due to medication access concerns regarding PAP re-enrollment for 2026.?  Patient reports his last shipment of Lantus  was in March (120 day supply). Luckily he does have extra insulin  and has not had to go without it.    Medication Access: ?  Prescription drug coverage: Yes Payor: MEDICARE / Plan: MEDICARE PART A AND B / Product Type: *No Product type* /  BLUEMEDICARERX IND PDP (PRIME BCBS [place]  COMM) .  Member ID: [id number] BIN: [id number]  Group ID: [id number] PCN: [id number]    Current Patient Assistance: [employer]i (Lantus )   Medicare LIS Eligible: No  Couples Income Limit $2485/month - Exceeded  2025 Poverty Guidelines  Family Size  200% 250%  300%  400%  500%   1  $31,300 $39,125  $46,950  $62,600 $78,250  2  $42,300 $52,875 $63,450  $84,600 $105,750  3  $53,300 $66,625 $79,950 $106,600 $133,250  4  64,300 $80,375 $96,450 $128,600 $160,750  Programs BI Cares Inhalers BI Cares [place]&Me BMS GSK NovoNordisk [employer] Sanofi MedicareD: [employer] cholesterol grant   Assessment and Plan:   1. Medication Access Patient has good supply of Lantus  and has not gone without insulin  injections though notes he has been struggling to get a refill with [employer]i PAP.  Called [employer]. They confirm last shipment was March. Should have received shipment in July. They acknowledge this is an error on their end. Current order is in process though backorder may delay shipment.  Re-enrollment opens 03/06/24 for [employer]ofi PAP though reasonably, patient is interested in other programs that supply insulin .   2026 consideration: Novo Nordisk (Tresiba U100) and Lilly  Cares (Basaglar  U100) both have income limits similar to that of Sanofi, so patient should likely qualify. Both insulins are reasonable alternative to Lantus  and are dosed 1:1 units with Lantus . Will discuss with PCP.    Follow up: Reminder set 11/1 to complete 2026 enrollment for basal insulin .   Manuelita FABIENE Kobs, PharmD Clinical Pharmacist [facility] [phone]

## 2024-02-18 NOTE — Assessment & Plan Note (Signed)
 PICC line looks intact and normal. Continue with cefepime and daptomycin.  D/w pt about flu shot- not done yet.  I need ID clinic input about that, especially since we are checking inflammatory markers periodically.  Will check weekly CBC, CMP, ESR, CRP, CK levels while the patient is on antibiotics- labs need to go to ID clinic.  Discussed.  Continue current antibiotics for now.  At this point still okay for outpatient follow-up.

## 2024-02-19 ENCOUNTER — Ambulatory Visit: Payer: Self-pay | Admitting: Family Medicine

## 2024-02-20 ENCOUNTER — Other Ambulatory Visit: Payer: Self-pay | Admitting: Family Medicine

## 2024-02-23 ENCOUNTER — Ambulatory Visit (INDEPENDENT_AMBULATORY_CARE_PROVIDER_SITE_OTHER): Admitting: Adult Health

## 2024-02-23 ENCOUNTER — Encounter: Payer: Self-pay | Admitting: Adult Health

## 2024-02-23 VITALS — BP 138/74 | HR 66 | Ht 73.0 in | Wt 177.0 lb

## 2024-02-23 DIAGNOSIS — I2723 Pulmonary hypertension due to lung diseases and hypoxia: Secondary | ICD-10-CM

## 2024-02-23 DIAGNOSIS — Z5181 Encounter for therapeutic drug level monitoring: Secondary | ICD-10-CM

## 2024-02-23 DIAGNOSIS — M869 Osteomyelitis, unspecified: Secondary | ICD-10-CM | POA: Diagnosis not present

## 2024-02-23 DIAGNOSIS — J9611 Chronic respiratory failure with hypoxia: Secondary | ICD-10-CM

## 2024-02-23 DIAGNOSIS — J84112 Idiopathic pulmonary fibrosis: Secondary | ICD-10-CM | POA: Diagnosis not present

## 2024-02-23 DIAGNOSIS — J439 Emphysema, unspecified: Secondary | ICD-10-CM

## 2024-02-23 DIAGNOSIS — Z87891 Personal history of nicotine dependence: Secondary | ICD-10-CM | POA: Diagnosis not present

## 2024-02-23 DIAGNOSIS — J432 Centrilobular emphysema: Secondary | ICD-10-CM

## 2024-02-23 DIAGNOSIS — M86371 Chronic multifocal osteomyelitis, right ankle and foot: Secondary | ICD-10-CM

## 2024-02-23 DIAGNOSIS — J8410 Pulmonary fibrosis, unspecified: Secondary | ICD-10-CM

## 2024-02-23 DIAGNOSIS — R0609 Other forms of dyspnea: Secondary | ICD-10-CM

## 2024-02-23 MED ORDER — INCRUSE ELLIPTA 62.5 MCG/ACT IN AEPB: 1.0000 | INHALATION_SPRAY | Freq: Every day | RESPIRATORY_TRACT | 5 refills | Status: DC

## 2024-02-23 NOTE — Patient Instructions (Addendum)
 Remain off Esbriet  for now.  Continue on Tyvaso Four times a day  as you are doing currently.  Continue on Oxygen  2l/m with activity and  At bedtime   Activity as tolerated.  Restart Incruse inhaler 1 puff daily  Labs today  Follow up with Dr. Geronimo in 6 weeks and As needed   Please contact office for sooner follow up if symptoms do not improve or worsen or seek emergency care

## 2024-02-23 NOTE — Progress Notes (Signed)
 @Patient  ID: Luke [name], male    DOB: [birth year], 78 y.o.   MRN: [id number]  Chief Complaint  Patient presents with   Medical Management of Chronic Issues    Referring provider: Cleatus Arlyss RAMAN, MD  HPI: 78 yo male former smoker followed for ILD/IPF, Emphysema, Chronic respiratory failure , Pulmonary Hypertension -WHO group 3    TEST/EVENTS :   Discussed the use of AI scribe software for clinical note transcription with the patient, who gave verbal consent to proceed.  History of Present Illness Luke [name] is a 78 year old male with pulmonary fibrosis who presents for a follow-up visit regarding his medication management.  Patient was recently hospitalized for osteomyelitis and is on prolonged IV antibiotics.  Previously was on Esbriet  for pulmonary fibrosis but this has been stopped due to diarrhea.  He was also taken off his Incruse inhaler and simvastatin  due to a foot infection that required hospitalization earlier this month. He is currently on a six-week course of IV antibiotics, which started on February 11, 2024, and has a PICC line for administration.  Started on Tyvaso last visit. He is using Tyvaso four times a day, He was on a dose of 64 mcg but experienced diarrhea and other issues, so he reduced the dose to 48 mcg, which he tolerates better. He also uses oxygen , achieving 94-95% saturation without it while sitting, but drops to 84% or lower when walking. He uses oxygen  at two liters with activity and at bedtime, which increases his saturation to 97-98%.  He was previously on Spiriva  but not covered by insurance. Was taking Incruse for COPD but stopped them during his hospital stay. He needs a prescription for Incruse, which was previously covered by insurance. He was on a patient assistance program for Incruse, but it may have ended.       Allergies  Allergen Reactions   Aldactone  [Spironolactone ] Other (See Comments)    Hyperkalemia   Brilinta   [Ticagrelor ] Shortness Of Breath    Numbness in hands and feet Blurry vision   Clopidogrel  Rash    Redness and Itchiness   Codeine Rash and Hives   Amoxicillin  Hives   Atorvastatin  Other (See Comments)    Myalgias with lipitor.  Does tolerate simvastatin .     Benadryl  [Diphenhydramine ] Hives   Januvia  [Sitagliptin ] Other (See Comments)    Diarrhea and heart racing   Jardiance  [Empagliflozin ] Other (See Comments)    Polyuria; excessive weight loss   Lisinopril  Other (See Comments)    Possible cause of pancreatitis   Rosuvastatin  Other (See Comments)    myalgia   Lasix  [Furosemide ] Rash    Immunization History  Administered Date(s) Administered   Fluad Quad(high Dose 65+) 02/23/2020, 04/13/2021   INFLUENZA, HIGH DOSE SEASONAL PF 02/08/2022   Influenza Split 01/31/2011, 02/11/2012   Influenza Whole 02/27/2010   Influenza,inj,Quad PF,6+ Mos 03/18/2013, 03/25/2014, 01/19/2015, 02/21/2016, 02/27/2017, 02/12/2018, 02/18/2019   Influenza-Unspecified 03/03/2020   PFIZER(Purple Top)SARS-COV-2 Vaccination 05/23/2019, 06/12/2019, 02/08/2020, 10/09/2020   Pfizer Covid-19 Vaccine Bivalent Booster 47yrs & up 08/02/2021, 03/19/2023   Pneumococcal Conjugate-13 10/18/2014   Pneumococcal Polysaccharide-23 11/14/2011   Td 07/05/2010    Past Medical History:  Diagnosis Date   AAA (abdominal aortic aneurysm) 2011   Per vascular surgery   Angio-edema    Atrial fibrillation (HCC)    CAD (coronary artery disease)    Presumed CAD with nuclear scan October 09, 2011,  large anteroseptal MI and inferior MI. Catheterization scheduled October 15, 2011  Cardiomyopathy [facility])    Nuclear, October 09, 2011, EF 30%, multiple focal wall motion abnormalities   Chronic kidney disease    CKD3   COVID-19    2021   Diabetes mellitus    type II   GERD (gastroesophageal reflux disease)    Silent   HLD (hyperlipidemia)    Hypertension    white coat HTN-- often elevated in office and controlled on outside checks.    ICD (implantable cardioverter-defibrillator) in place    CRT-D placed March, 2014 complete heart block and k dysfunction   IPF (idiopathic pulmonary fibrosis) (HCC)    LBBB (left bundle branch block)    LBBB on EKG October 11, 2011,  no prior EKG has been done   Low testosterone    Hx of   On home oxygen  therapy    2L as needed   Pacemaker    PAD (peripheral artery disease)    Pancreatitis    Peripheral arterial disease    Umbilical hernia     Tobacco History: Social History   Tobacco Use  Smoking Status Former   Current packs/day: 0.00   Average packs/day: 2.0 packs/day for 42.0 years (84.0 ttl pk-yrs)   Types: Cigarettes   Start date: 1964   Quit date: 05/06/2004   Years since quitting: 19.8   Passive exposure: Never  Smokeless Tobacco Never   Counseling given: Not Answered   Outpatient Medications Prior to Visit  Medication Sig Dispense Refill   aspirin  EC 81 MG tablet Take 1 tablet (81 mg total) by mouth daily. Swallow whole.     carvedilol  (COREG ) 25 MG tablet TAKE 1 TABLET BY MOUTH TWICE DAILY WITH MEALS 180 tablet 3   ceFEPime (MAXIPIME) IVPB Inject 2 g into the vein every 8 (eight) hours. Indication:  R-foot osteo First Dose: Yes Last Day of Therapy:  03/19/24 Labs - Once weekly:  CBC/D and BMP, Labs - Once weekly: ESR and CRP Method of administration: IV Push Method of administration may be changed at the discretion of home infusion pharmacist based upon assessment of the patient and/or caregiver's ability to self-administer the medication ordered. 114 Units 0   Cholecalciferol  (VITAMIN D3) 50 MCG (2000 UT) TABS Take 2,000 Units by mouth in the morning.     Cyanocobalamin  (VITAMIN B-12 PO) Take 2,000 mcg by mouth in the morning.     daptomycin (CUBICIN) IVPB Inject 700 mg into the vein daily. Indication:  R-foot osteo First Dose: Yes Last Day of Therapy:  03/19/24 Labs - Once weekly:  CBC/D, BMP, and CPK Labs - Once weekly: ESR and CRP Method of administration:  IV Push Method of administration may be changed at the discretion of home infusion pharmacist based upon assessment of the patient and/or caregiver's ability to self-administer the medication ordered. 114 Units 0   glipiZIDE  (GLUCOTROL ) 5 MG tablet TAKE 1 TABLET(5 MG) BY MOUTH TWICE DAILY BEFORE A MEAL 180 tablet 0   glucose blood (ACCU-CHEK AVIVA PLUS) test strip USE AS DIRECTED TO TEST BLOOD SUGAR TWICE DAILY 200 strip 3   insulin  glargine (LANTUS  SOLOSTAR) 100 UNIT/ML Solostar Pen INJECT 0.3 TO 0.35 MLS(30 TO 35 UNITS) INTO THE SKIN EVERY DAY 15 mL 3   metFORMIN  (GLUCOPHAGE ) 500 MG tablet TAKE 2 TABLETS BY MOUTH TWICE DAILY WITH FOOD 360 tablet 3   Multiple Vitamin (MULTIVITAMIN WITH MINERALS) TABS tablet Take 1 tablet by mouth in the morning.     omeprazole  (PRILOSEC) 40 MG capsule TAKE 1 CAPSULE(40 MG) BY  MOUTH DAILY 90 capsule 3   prasugrel  (EFFIENT ) 10 MG TABS tablet Take 0.5 tablets (5 mg total) by mouth in the morning and at bedtime.     simvastatin  (ZOCOR ) 20 MG tablet TAKE 1 TABLET(20 MG) BY MOUTH AT BEDTIME 90 tablet 3   torsemide  (DEMADEX ) 20 MG tablet Take 1 tablet (20 mg total) by mouth daily as needed (for swelling).     Treprostinil (TYVASO DPI MAINTENANCE KIT) 64 MCG POWD Inhale 1 puff into the lungs in the morning, at noon, in the evening, and at bedtime.     triamcinolone  ointment (KENALOG ) 0.1 % Apply topically 2 (two) times daily. 454 g 3   valsartan  (DIOVAN ) 40 MG tablet Take 1 tablet (40 mg total) by mouth daily with supper. 90 tablet 3   No facility-administered medications prior to visit.     Review of Systems:   Constitutional:   No  weight loss, night sweats,  Fevers, chills, +fatigue, or  lassitude.  HEENT:   No headaches,  Difficulty swallowing,  Tooth/dental problems, or  Sore throat,                No sneezing, itching, ear ache, nasal congestion, post nasal drip,   CV:  No chest pain,  Orthopnea, PND, swelling in lower extremities, anasarca, dizziness,  palpitations, syncope.   GI  No heartburn, indigestion, abdominal pain, nausea, vomiting, diarrhea, change in bowel habits, loss of appetite, bloody stools.   Resp:   No chest wall deformity  Skin: no rash or lesions.  GU: no dysuria, change in color of urine, no urgency or frequency.  No flank pain, no hematuria   MS:  No joint pain or swelling.  No decreased range of motion.  No back pain.    Physical Exam  BP 138/74   Pulse 66   Ht 6' 1 (1.854 m)   Wt 177 lb (80.3 kg)   SpO2 92%   BMI 23.35 kg/m   GEN: A/Ox3; pleasant , NAD, well nourished    HEENT:  [place]/AT,   NOSE-clear, THROAT-clear, no lesions, no postnasal drip or exudate noted.   NECK:  Supple w/ fair ROM; no JVD; normal carotid impulses w/o bruits; no thyromegaly or nodules palpated; no lymphadenopathy.    RESP BB crackles   no accessory muscle use, no dullness to percussion  CARD:  RRR, no m/r/g, tr  peripheral edema, pulses intact, no cyanosis or clubbing.  GI:   Soft & nt; nml bowel sounds; no organomegaly or masses detected.   Musco: Warm bil, no deformities or joint swelling noted.   Neuro: alert, no focal deficits noted.    Skin: Warm, no lesions or rashes    Lab Results:    BNP   Imaging:   Administration History     None          Latest Ref Rng & Units 11/11/2023    2:12 PM 04/25/2023   12:45 PM 01/09/2023   11:01 AM 07/29/2022   10:16 AM 04/04/2022    1:20 PM 01/10/2022    1:42 PM 10/02/2021    8:45 AM  PFT Results  FVC-Pre L 3.30  3.53  3.76  3.83  3.79  3.73  4.09   FVC-Predicted Pre % 73  77  82  83  82  81  88   FVC-Post L 3.19         FVC-Predicted Post % 70         Pre FEV1/FVC % %  65  68  66  67  68  69  68   Post FEV1/FCV % % 67         FEV1-Pre L 2.14  2.39  2.48  2.56  2.57  2.56  2.77   FEV1-Predicted Pre % 65  72  75  77  77  77  82   FEV1-Post L 2.13         DLCO uncorrected ml/min/mmHg 7.97  8.22  6.86  11.69  9.71  11.84  13.10   DLCO UNC% % 30  30  25   43  36   44  48   DLCO corrected ml/min/mmHg 8.72  8.22   11.69  9.71  11.84  13.45   DLCO COR %Predicted % 33  30   43  36  44  50   DLVA Predicted % 51  45  35  56  47  67  62   TLC L 5.18   6.05       TLC % Predicted % 69   81       RV % Predicted % 69   84         No results found for: NITRICOXIDE      Assessment & Plan:   Assessment and Plan Assessment & Plan Idiopathic pulmonary fibrosis -progressive type  Previously managed with Esbriet  low dose , which was discontinued due to diarrhea. Currently stable on Tyvaso 48 mcg four times a day. An attempt to increase to 64 mcg resulted in diarrhea, likely due to concurrent Esbriet  use. Plan to consider reintroducing Esbriet  after completing IV antibiotics and stabilizing gastrointestinal symptoms. Continue Tyvaso 48 mcg four times a day. Continue on O2 .   Emphysema with heavy smoking history-uncontrolled  Emphysema. Incruse was stopped during a hospital stay questionable etiology and no current inhaler is used .  Given the severity, restarting Incruse is appropriate. Restart Incruse, one puff daily,.   Chronic hypoxemic respiratory failure requiring supplemental oxygen   -stable on o2  Experiences hypoxemic respiratory failure with oxygen  saturation dropping to 84% during activity. Uses supplemental oxygen  at night and is advised to use it during activity.  Continue supplemental oxygen  at two liters with activity and at bedtime.  Medication side effects-GI Esbriet  currently on hold.  Consider restarting once completed with IV antibiotics  Recent hospitalization for osteomyelitis-continue follow-up with primary care and ongoing IV antibiotics.  Pulmonary hypertension WHO class III-recently started on Tyvaso.  Currently tolerating Tyvaso 48 mcg 4 times daily.  Hold at this dose for now as he is tolerating and side effect profile is tolerable.  On return consider increasing dose if able. Lab work completed today  Plan  Patient Instructions   Remain off Esbriet  for now.  Continue on Tyvaso Four times a day  as you are doing currently.  Continue on Oxygen  2l/m with activity and  At bedtime   Activity as tolerated.  Restart Incruse inhaler 1 puff daily  Labs today  Follow up with Dr. Geronimo in 6 weeks and As needed   Please contact office for sooner follow up if symptoms do not improve or worsen or seek emergency care          I spent   43 minutes dedicated to the care of this patient on the date of this encounter to include pre-visit review of records, face-to-face time with the patient discussing conditions above, post visit ordering of testing, clinical documentation with the electronic health record,  making appropriate referrals as documented, and communicating necessary findings to members of the patients care team.    Madelin Stank, NP 02/23/2024

## 2024-02-24 DIAGNOSIS — E1169 Type 2 diabetes mellitus with other specified complication: Secondary | ICD-10-CM | POA: Diagnosis not present

## 2024-02-24 DIAGNOSIS — M86671 Other chronic osteomyelitis, right ankle and foot: Secondary | ICD-10-CM | POA: Diagnosis not present

## 2024-02-24 DIAGNOSIS — L97519 Non-pressure chronic ulcer of other part of right foot with unspecified severity: Secondary | ICD-10-CM | POA: Diagnosis not present

## 2024-02-24 DIAGNOSIS — S92341D Displaced fracture of fourth metatarsal bone, right foot, subsequent encounter for fracture with routine healing: Secondary | ICD-10-CM | POA: Diagnosis not present

## 2024-02-24 DIAGNOSIS — E1151 Type 2 diabetes mellitus with diabetic peripheral angiopathy without gangrene: Secondary | ICD-10-CM | POA: Diagnosis not present

## 2024-02-24 DIAGNOSIS — E11621 Type 2 diabetes mellitus with foot ulcer: Secondary | ICD-10-CM | POA: Diagnosis not present

## 2024-02-24 LAB — COMPREHENSIVE METABOLIC PANEL WITH GFR
ALT: 17 U/L (ref 0–53)
AST: 18 U/L (ref 0–37)
Albumin: 4.3 g/dL (ref 3.5–5.2)
Alkaline Phosphatase: 63 U/L (ref 39–117)
BUN: 31 mg/dL — ABNORMAL HIGH (ref 6–23)
CO2: 23 meq/L (ref 19–32)
Calcium: 10.7 mg/dL — ABNORMAL HIGH (ref 8.4–10.5)
Chloride: 99 meq/L (ref 96–112)
Creatinine, Ser: 1.47 mg/dL (ref 0.40–1.50)
GFR: 45.77 mL/min — ABNORMAL LOW (ref >60.00)
Glucose, Bld: 181 mg/dL — ABNORMAL HIGH (ref 70–99)
Potassium: 5.3 meq/L — ABNORMAL HIGH (ref 3.5–5.1)
Sodium: 133 meq/L — ABNORMAL LOW (ref 135–145)
Total Bilirubin: 0.5 mg/dL (ref 0.2–1.2)
Total Protein: 8.3 g/dL (ref 6.0–8.3)

## 2024-02-24 LAB — CBC WITH DIFFERENTIAL/PLATELET
Basophils Absolute: 0.2 K/uL — ABNORMAL HIGH (ref 0.0–0.1)
Basophils Relative: 1.4 % (ref 0.0–3.0)
Eosinophils Absolute: 0.6 K/uL (ref 0.0–0.7)
Eosinophils Relative: 5.3 % — ABNORMAL HIGH (ref 0.0–5.0)
HCT: 37.6 % — ABNORMAL LOW (ref 39.0–52.0)
Hemoglobin: 12.6 g/dL — ABNORMAL LOW (ref 13.0–17.0)
Lymphocytes Relative: 7.6 % — ABNORMAL LOW (ref 12.0–46.0)
Lymphs Abs: 0.9 K/uL (ref 0.7–4.0)
MCHC: 33.5 g/dL (ref 30.0–36.0)
MCV: 98.1 fl (ref 78.0–100.0)
Monocytes Absolute: 0.8 K/uL (ref 0.1–1.0)
Monocytes Relative: 6.6 % (ref 3.0–12.0)
Neutro Abs: 9.4 K/uL — ABNORMAL HIGH (ref 1.4–7.7)
Neutrophils Relative %: 79.1 % — ABNORMAL HIGH (ref 43.0–77.0)
Platelets: 293 K/uL (ref 150.0–400.0)
RBC: 3.83 Mil/uL — ABNORMAL LOW (ref 4.22–5.81)
RDW: 13.3 % (ref 11.5–15.5)
WBC: 11.8 K/uL — ABNORMAL HIGH (ref 4.0–10.5)

## 2024-02-25 ENCOUNTER — Encounter: Payer: Self-pay | Admitting: Podiatry

## 2024-02-25 ENCOUNTER — Telehealth: Payer: Self-pay | Admitting: Family Medicine

## 2024-02-25 ENCOUNTER — Ambulatory Visit: Admitting: Podiatry

## 2024-02-25 ENCOUNTER — Ambulatory Visit: Payer: Self-pay | Admitting: Adult Health

## 2024-02-25 VITALS — Ht 73.0 in | Wt 177.0 lb

## 2024-02-25 DIAGNOSIS — M86371 Chronic multifocal osteomyelitis, right ankle and foot: Secondary | ICD-10-CM | POA: Diagnosis not present

## 2024-02-25 DIAGNOSIS — E875 Hyperkalemia: Secondary | ICD-10-CM

## 2024-02-25 NOTE — Telephone Encounter (Signed)
 Please check with patient.  He had follow-up labs done but he did not have his CK sed rate and CRP rechecked.  He needs to have those done with his other labs weekly.  Did he have them drawn elsewhere?  Has he talked to the ID clinic about this in the meantime?  Please let me know.  Thanks.

## 2024-02-25 NOTE — Progress Notes (Signed)
Chief Complaint  Patient presents with   Foot Pain    Pt is here to f/u right foot due to infection, states the foot looks better, has neuropathy so no feeling in the foot, currently doing IV antibiotics for the infection, no other complaints    HPI: 78 y.o. male presenting today for outpatient evaluation of chronic osteomyelitis to the right forefoot.  Currently on PICC line IV antibiotics.  Also being managed by vascular for peripheral vascular disease  Past Medical History:  Diagnosis Date   AAA (abdominal aortic aneurysm) 2011   Per vascular surgery   Angio-edema    Atrial fibrillation (HCC)    CAD (coronary artery disease)    Presumed CAD with nuclear scan October 09, 2011,  large anteroseptal MI and inferior MI. Catheterization scheduled October 15, 2011   Cardiomyopathy [facility])    Nuclear, October 09, 2011, EF 30%, multiple focal wall motion abnormalities   Chronic kidney disease    CKD3   COVID-19    2021   Diabetes mellitus    type II   GERD (gastroesophageal reflux disease)    Silent   HLD (hyperlipidemia)    Hypertension    white coat HTN-- often elevated in office and controlled on outside checks.   ICD (implantable cardioverter-defibrillator) in place    CRT-D placed March, 2014 complete heart block and k dysfunction   IPF (idiopathic pulmonary fibrosis) (HCC)    LBBB (left bundle branch block)    LBBB on EKG October 11, 2011,  no prior EKG has been done   Low testosterone    Hx of   On home oxygen  therapy    2L as needed   Pacemaker    PAD (peripheral artery disease)    Pancreatitis    Peripheral arterial disease    Umbilical hernia     Past Surgical History:  Procedure Laterality Date   ABDOMINAL AORTAGRAM N/A 10/28/2011   Procedure: ABDOMINAL EZELLA;  Surgeon: Lonni GORMAN Blade, MD;  Location: [facility] CATH LAB;  Service: Cardiovascular;  Laterality: N/A;   ABDOMINAL AORTIC ANEURYSM REPAIR     EVAR    ABDOMINAL AORTOGRAM N/A 05/27/2022   Procedure: ABDOMINAL  AORTOGRAM;  Surgeon: Sheree Penne Lonni, MD;  Location: [facility] INVASIVE CV LAB;  Service: Cardiovascular;  Laterality: N/A;   ABDOMINAL AORTOGRAM N/A 02/09/2024   Procedure: ABDOMINAL AORTOGRAM;  Surgeon: Pearline Norman GORMAN, MD;  Location: [facility] INVASIVE CV LAB;  Service: Cardiovascular;  Laterality: N/A;   BI-VENTRICULAR IMPLANTABLE CARDIOVERTER DEFIBRILLATOR N/A 07/16/2012   Procedure: BI-VENTRICULAR IMPLANTABLE CARDIOVERTER DEFIBRILLATOR  (CRT-D);  Surgeon: Elspeth JAYSON Sage, MD;  Location: [facility] CATH LAB;  Service: Cardiovascular;  Laterality: N/A;   BIV ICD GENERATOR CHANGEOUT N/A 10/25/2020   Procedure: BIV ICD GENERATOR CHANGEOUT;  Surgeon: Sage Elspeth JAYSON, MD;  Location: [facility] INVASIVE CV LAB;  Service: Cardiovascular;  Laterality: N/A;   CARDIAC CATHETERIZATION     CATARACT EXTRACTION Left 2023   COLONOSCOPY  03/23/2018; 2020   CORONARY CTO INTERVENTION N/A 10/10/2022   Procedure: CORONARY CTO INTERVENTION;  Surgeon: Dann Candyce GORMAN, MD;  Location: [facility] INVASIVE CV LAB;  Service: Cardiovascular;  Laterality: N/A;   CORONARY LITHOTRIPSY N/A 09/18/2022   Procedure: CORONARY LITHOTRIPSY;  Surgeon: Dann Candyce GORMAN, MD;  Location: [facility] INVASIVE CV LAB;  Service: Cardiovascular;  Laterality: N/A;   CORONARY STENT INTERVENTION N/A 09/18/2022   Procedure: CORONARY STENT INTERVENTION;  Surgeon: Dann Candyce GORMAN, MD;  Location: [facility] INVASIVE CV LAB;  Service: Cardiovascular;  Laterality: N/A;  CORONARY ULTRASOUND/IVUS N/A 09/18/2022   Procedure: Coronary Ultrasound/IVUS;  Surgeon: Dann Candyce RAMAN, MD;  Location: [facility] INVASIVE CV LAB;  Service: Cardiovascular;  Laterality: N/A;   ENDARTERECTOMY FEMORAL Right 11/19/2022   Procedure: RIGHT COMMON FEMORAL ENDARTERECTOMY WITH VEIN PATCH ANGIOPLASTY;  Surgeon: Sheree Penne Bruckner, MD;  Location: [facility] OR;  Service: Vascular;  Laterality: Right;   ESOPHAGOGASTRODUODENOSCOPY N/A 02/12/2019   Procedure: ESOPHAGOGASTRODUODENOSCOPY (EGD);  Surgeon: Charlanne Groom, MD;  Location: [facility] ENDOSCOPY;  Service: Endoscopy;  Laterality: N/A;   ESOPHAGOGASTRODUODENOSCOPY (EGD) WITH PROPOFOL  N/A 02/05/2018   Procedure: ESOPHAGOGASTRODUODENOSCOPY (EGD) WITH PROPOFOL ;  Surgeon: Shila Gustav GAILS, MD;  Location: [facility] ENDOSCOPY;  Service: Endoscopy;  Laterality: N/A;   ESOPHAGOGASTRODUODENOSCOPY (EGD) WITH PROPOFOL  N/A 09/27/2018   Procedure: ESOPHAGOGASTRODUODENOSCOPY (EGD) WITH PROPOFOL ;  Surgeon: Rollin Dover, MD;  Location: [facility] ENDOSCOPY;  Service: Endoscopy;  Laterality: N/A;   FOREIGN BODY REMOVAL  09/27/2018   Procedure: FOREIGN BODY REMOVAL;  Surgeon: Rollin Dover, MD;  Location: [facility] ENDOSCOPY;  Service: Endoscopy;;   FOREIGN BODY REMOVAL  02/12/2019   Procedure: FOREIGN BODY REMOVAL;  Surgeon: Charlanne Groom, MD;  Location: [facility] ENDOSCOPY;  Service: Endoscopy;;   HERNIA REPAIR  05/14/2013   INSERTION OF MESH N/A 05/14/2013   Procedure: INSERTION OF MESH;  Surgeon: Elspeth KYM Schultze, MD;  Location: [facility] OR;  Service: General;  Laterality: N/A;   LEFT HEART CATH N/A 10/10/2022   Procedure: Left Heart Cath;  Surgeon: Dann Candyce RAMAN, MD;  Location: [facility] INVASIVE CV LAB;  Service: Cardiovascular;  Laterality: N/A;   LEFT HEART CATH AND CORONARY ANGIOGRAPHY N/A 07/17/2021   Procedure: LEFT HEART CATH AND CORONARY ANGIOGRAPHY;  Surgeon: Burnard Debby LABOR, MD;  Location: [facility] INVASIVE CV LAB;  Service: Cardiovascular;  Laterality: N/A;   LEFT HEART CATH AND CORONARY ANGIOGRAPHY N/A 09/18/2022   Procedure: LEFT HEART CATH AND CORONARY ANGIOGRAPHY;  Surgeon: Dann Candyce RAMAN, MD;  Location: [facility] INVASIVE CV LAB;  Service: Cardiovascular;  Laterality: N/A;   LOWER EXTREMITY ANGIOGRAM Bilateral 10/28/2011   Procedure: LOWER EXTREMITY ANGIOGRAM;  Surgeon: Bruckner RAMAN Blade, MD;  Location: [facility] CATH LAB;  Service: Cardiovascular;  Laterality: Bilateral;   LOWER EXTREMITY ANGIOGRAM Right 11/19/2022   Procedure: RIGHT LOWER EXTREMITY ANGIOGRAM;  Surgeon: Sheree Penne Bruckner, MD;  Location: [facility] OR;  Service: Vascular;  Laterality: Right;   LOWER EXTREMITY ANGIOGRAPHY  05/27/2022   Procedure: Lower Extremity Angiography;  Surgeon: Sheree Penne Bruckner, MD;  Location: [facility] INVASIVE CV LAB;  Service: Cardiovascular;;   LOWER EXTREMITY ANGIOGRAPHY Right 02/09/2024   Procedure: Lower Extremity Angiography;  Surgeon: Pearline Norman RAMAN, MD;  Location: [facility] INVASIVE CV LAB;  Service: Cardiovascular;  Laterality: Right;   PACEMAKER INSERTION  07/16/2012   pacemaker/defibrilator   PATCH ANGIOPLASTY Right 11/19/2022   Procedure: VEIN PATCH ANGIOPLASTY;  Surgeon: Sheree Penne Bruckner, MD;  Location: [facility] OR;  Service: Vascular;  Laterality: Right;   POLYPECTOMY     POSTERIOR CERVICAL FUSION/FORAMINOTOMY N/A 06/09/2014   Procedure: Laminectomy - Cervical two-Cervcial four posterior cervical instrumented fusion Cervical two-cervical four;  Surgeon: Alm RAMAN Molt, MD;  Location: [facility] NEURO ORS;  Service: Neurosurgery;  Laterality: N/A;  posterior    RIGHT/LEFT HEART CATH AND CORONARY ANGIOGRAPHY N/A 10/10/2023   Procedure: RIGHT/LEFT HEART CATH AND CORONARY ANGIOGRAPHY;  Surgeon: Elmira Newman PARAS, MD;  Location: [facility] INVASIVE CV LAB;  Service: Cardiovascular;  Laterality: N/A;   SAVORY DILATION N/A 02/05/2018   Procedure: SAVORY DILATION;  Surgeon: Shila Gustav GAILS, MD;  Location: [facility] ENDOSCOPY;  Service: Endoscopy;  Laterality: N/A;   TONSILLECTOMY     as a child    UMBILICAL HERNIA REPAIR N/A 05/14/2013   Procedure: LAPAROSCOPIC exploration and repair of hernia in abdominal ;  Surgeon: Elspeth KYM Schultze, MD;  Location: [facility] OR;  Service: General;  Laterality: N/A;   UPPER GASTROINTESTINAL ENDOSCOPY  2020    Allergies  Allergen Reactions   Aldactone  [Spironolactone ] Other (See Comments)    Hyperkalemia   Brilinta  [Ticagrelor ] Shortness Of Breath    Numbness in hands and feet Blurry vision   Clopidogrel  Rash    Redness and Itchiness   Codeine Rash and Hives    Amoxicillin  Hives   Atorvastatin  Other (See Comments)    Myalgias with lipitor.  Does tolerate simvastatin .     Benadryl  [Diphenhydramine ] Hives   Januvia  [Sitagliptin ] Other (See Comments)    Diarrhea and heart racing   Jardiance  [Empagliflozin ] Other (See Comments)    Polyuria; excessive weight loss   Lisinopril  Other (See Comments)    Possible cause of pancreatitis   Rosuvastatin  Other (See Comments)    myalgia   Lasix  [Furosemide ] Rash     Physical Exam: General: The patient is alert and oriented x3 in no acute distress.  Dermatology: Today there are no open wounds to the foot.  The skin is healthy and viable.  There is no erythema or inflammation noted to the foot concerning for acute active osteomyelitis  Vascular: Skin is cool to touch.  No appreciable edema.  No erythema. Procedures: PERIPHERAL VASCULAR CATHETERIZATION  Height: 6' 1 (1.854 m)  Weight: 181 lb (82.1 kg)  Blood Pressure: 147/82  Heart Rate: 79   Accession Number: [id number]  Date of Study: 02/09/24  Ordering Provider: Pearline Norman RAMAN, MD   Clinical Indications: pad    Findings:  Widely patent aortic endograft and bilateral renal arteries.  Widely patent EVAR limbs and iliac systems.   Patent right common femoral endarterectomy site with widely patent profunda.  The SFA is diffusely diseased and is chronically occluded at its mid segment.  An [place] of above-knee popliteal is reconstituted that is severely diseased and the below-knee popliteal artery is completely occluded as well as the TP trunk with reconstitution of the AT PT and peroneal arteries from collaterals.  Neurological: Grossly intact via light touch  Musculoskeletal Exam: Patient ambulatory.  No prior amputations  CT FOOT RIGHT WO CONTRAST (Accession [id number]) (Order [id number]) Imaging Date: 02/05/2024 Department: [facility] [address] Released By: Brien Barnie PARAS Authorizing: Gershon Donnice SAUNDERS, DPM   IMPRESSION: 1. Active osteomyelitis involving the head of the 1st metatarsal, base of the proximal phalanx of the great toe, phalanges of the third and fourth toes, and head of the fourth metatarsal. 2. Oblique fracture through the distal metaphysis of the 4th metatarsal.  3. Possible sesamoiditis or osteomyelitis of the 1st digit sesamoids. 4. Circumferential ankle and dorsal forefoot subcutaneous edema. 5. Large plantar and Achilles calcaneal spurs, posterior subtalar spur, and mild dorsal midfoot spurring. 6. Small well-corticated ossicles below the medial malleolus.  Assessment/Plan of Care: 1.  Chronic osteomyelitis right forefoot 2.   peripheral vascular disease  -Patient evaluated -Continue mgmt w/ ID. IV cefepime, dosed as per pharmacy for 6 weeks. F/u appt 03/02/2024 -Continue close management with vascular surgery. F/u appt 03/23/2024.  -For now the foot appears stable.  We will simply observe to see how he responds to the IV antibiotics. -Return to clinic 1 month follow-up x-rays  Thresa EMERSON Sar, DPM [facility]  Dr. Thresa EMERSON Sar, DPM    [address] [place], [place] [address]                Office [phone]  Fax [phone]

## 2024-02-25 NOTE — Progress Notes (Signed)
 Called and spoke to pt - advised of lab results per Tammy Parrett. Pt verbalized understanding, NFN.

## 2024-02-26 ENCOUNTER — Ambulatory Visit: Payer: Self-pay | Admitting: Adult Health

## 2024-02-26 ENCOUNTER — Other Ambulatory Visit (INDEPENDENT_AMBULATORY_CARE_PROVIDER_SITE_OTHER)

## 2024-02-26 DIAGNOSIS — E875 Hyperkalemia: Secondary | ICD-10-CM

## 2024-02-26 LAB — BASIC METABOLIC PANEL WITH GFR
BUN: 31 mg/dL — ABNORMAL HIGH (ref 6–23)
CO2: 23 meq/L (ref 19–32)
Calcium: 10 mg/dL (ref 8.4–10.5)
Chloride: 102 meq/L (ref 96–112)
Creatinine, Ser: 1.65 mg/dL — ABNORMAL HIGH (ref 0.40–1.50)
GFR: 39.84 mL/min — ABNORMAL LOW (ref >60.00)
Glucose, Bld: 190 mg/dL — ABNORMAL HIGH (ref 70–99)
Potassium: 5.1 meq/L (ref 3.5–5.1)
Sodium: 135 meq/L (ref 135–145)

## 2024-02-26 NOTE — Telephone Encounter (Signed)
 Spoke with pt notifying him we need him to complete his portion of the PAP application for Lantus . Pt verbalizes understanding and will come by today to complete and sign highlighted areas.   Then form should be placed in Lindsay's box at front office.  [Placed form at front office.]

## 2024-02-26 NOTE — Telephone Encounter (Signed)
 Spoke with pt about labs and asked if had done somewhere else. States he's having that done today at another location.

## 2024-02-26 NOTE — Telephone Encounter (Signed)
 Noted. Thanks.

## 2024-02-28 ENCOUNTER — Emergency Department (HOSPITAL_COMMUNITY)

## 2024-02-28 ENCOUNTER — Other Ambulatory Visit: Payer: Self-pay

## 2024-02-28 ENCOUNTER — Observation Stay (HOSPITAL_COMMUNITY)
Admission: EM | Admit: 2024-02-28 | Discharge: 2024-02-29 | Disposition: A | Discharge status: Home / Self Care | Attending: Internal Medicine | Admitting: Internal Medicine

## 2024-02-28 DIAGNOSIS — R55 Syncope and collapse: Secondary | ICD-10-CM | POA: Diagnosis not present

## 2024-02-28 DIAGNOSIS — Z91040 Latex allergy status: Secondary | ICD-10-CM | POA: Diagnosis not present

## 2024-02-28 DIAGNOSIS — N1832 Chronic kidney disease, stage 3b: Secondary | ICD-10-CM | POA: Insufficient documentation

## 2024-02-28 DIAGNOSIS — J84112 Idiopathic pulmonary fibrosis: Secondary | ICD-10-CM | POA: Diagnosis not present

## 2024-02-28 DIAGNOSIS — I469 Cardiac arrest, cause unspecified: Secondary | ICD-10-CM | POA: Diagnosis not present

## 2024-02-28 DIAGNOSIS — E1169 Type 2 diabetes mellitus with other specified complication: Secondary | ICD-10-CM | POA: Diagnosis present

## 2024-02-28 DIAGNOSIS — R092 Respiratory arrest: Secondary | ICD-10-CM

## 2024-02-28 DIAGNOSIS — I2510 Atherosclerotic heart disease of native coronary artery without angina pectoris: Secondary | ICD-10-CM | POA: Diagnosis present

## 2024-02-28 DIAGNOSIS — I130 Hypertensive heart and chronic kidney disease with heart failure and stage 1 through stage 4 chronic kidney disease, or unspecified chronic kidney disease: Secondary | ICD-10-CM | POA: Diagnosis not present

## 2024-02-28 DIAGNOSIS — I480 Paroxysmal atrial fibrillation: Secondary | ICD-10-CM | POA: Insufficient documentation

## 2024-02-28 DIAGNOSIS — Z7982 Long term (current) use of aspirin: Secondary | ICD-10-CM | POA: Insufficient documentation

## 2024-02-28 DIAGNOSIS — E1122 Type 2 diabetes mellitus with diabetic chronic kidney disease: Secondary | ICD-10-CM | POA: Insufficient documentation

## 2024-02-28 DIAGNOSIS — R7401 Elevation of levels of liver transaminase levels: Secondary | ICD-10-CM | POA: Diagnosis not present

## 2024-02-28 DIAGNOSIS — I5022 Chronic systolic (congestive) heart failure: Secondary | ICD-10-CM | POA: Diagnosis present

## 2024-02-28 DIAGNOSIS — J9601 Acute respiratory failure with hypoxia: Secondary | ICD-10-CM | POA: Diagnosis not present

## 2024-02-28 DIAGNOSIS — Z794 Long term (current) use of insulin: Secondary | ICD-10-CM | POA: Diagnosis not present

## 2024-02-28 DIAGNOSIS — R262 Difficulty in walking, not elsewhere classified: Secondary | ICD-10-CM | POA: Diagnosis not present

## 2024-02-28 DIAGNOSIS — I714 Abdominal aortic aneurysm, without rupture, unspecified: Secondary | ICD-10-CM | POA: Insufficient documentation

## 2024-02-28 DIAGNOSIS — Z95810 Presence of automatic (implantable) cardiac defibrillator: Secondary | ICD-10-CM | POA: Diagnosis present

## 2024-02-28 DIAGNOSIS — R230 Cyanosis: Secondary | ICD-10-CM | POA: Diagnosis not present

## 2024-02-28 DIAGNOSIS — I428 Other cardiomyopathies: Secondary | ICD-10-CM | POA: Diagnosis not present

## 2024-02-28 DIAGNOSIS — Z95820 Peripheral vascular angioplasty status with implants and grafts: Secondary | ICD-10-CM | POA: Diagnosis not present

## 2024-02-28 DIAGNOSIS — Z79899 Other long term (current) drug therapy: Secondary | ICD-10-CM | POA: Diagnosis not present

## 2024-02-28 DIAGNOSIS — M86171 Other acute osteomyelitis, right ankle and foot: Secondary | ICD-10-CM | POA: Diagnosis not present

## 2024-02-28 DIAGNOSIS — E785 Hyperlipidemia, unspecified: Secondary | ICD-10-CM | POA: Diagnosis present

## 2024-02-28 DIAGNOSIS — Z8679 Personal history of other diseases of the circulatory system: Secondary | ICD-10-CM | POA: Insufficient documentation

## 2024-02-28 DIAGNOSIS — J439 Emphysema, unspecified: Secondary | ICD-10-CM | POA: Diagnosis not present

## 2024-02-28 DIAGNOSIS — N179 Acute kidney failure, unspecified: Secondary | ICD-10-CM | POA: Insufficient documentation

## 2024-02-28 DIAGNOSIS — R7989 Other specified abnormal findings of blood chemistry: Principal | ICD-10-CM | POA: Insufficient documentation

## 2024-02-28 DIAGNOSIS — R0902 Hypoxemia: Secondary | ICD-10-CM | POA: Diagnosis present

## 2024-02-28 DIAGNOSIS — Z87891 Personal history of nicotine dependence: Secondary | ICD-10-CM | POA: Insufficient documentation

## 2024-02-28 DIAGNOSIS — R0602 Shortness of breath: Secondary | ICD-10-CM | POA: Diagnosis present

## 2024-02-28 DIAGNOSIS — R509 Fever, unspecified: Secondary | ICD-10-CM | POA: Diagnosis not present

## 2024-02-28 DIAGNOSIS — J8410 Pulmonary fibrosis, unspecified: Secondary | ICD-10-CM | POA: Diagnosis not present

## 2024-02-28 DIAGNOSIS — N183 Chronic kidney disease, stage 3 unspecified: Secondary | ICD-10-CM | POA: Diagnosis present

## 2024-02-28 LAB — COMPREHENSIVE METABOLIC PANEL WITH GFR
ALT: 101 U/L — ABNORMAL HIGH (ref 0–44)
AST: 172 U/L — ABNORMAL HIGH (ref 15–41)
Albumin: 3 g/dL — ABNORMAL LOW (ref 3.5–5.0)
Alkaline Phosphatase: 87 U/L (ref 38–126)
Anion gap: 17 — ABNORMAL HIGH (ref 5–15)
BUN: 31 mg/dL — ABNORMAL HIGH (ref 8–23)
CO2: 16 mmol/L — ABNORMAL LOW (ref 22–32)
Calcium: 9.3 mg/dL (ref 8.9–10.3)
Chloride: 101 mmol/L (ref 98–111)
Creatinine, Ser: 1.89 mg/dL — ABNORMAL HIGH (ref 0.61–1.24)
GFR, Estimated: 36 mL/min — ABNORMAL LOW (ref >60)
Glucose, Bld: 297 mg/dL — ABNORMAL HIGH (ref 70–99)
Potassium: 4.4 mmol/L (ref 3.5–5.1)
Sodium: 134 mmol/L — ABNORMAL LOW (ref 135–145)
Total Bilirubin: 0.6 mg/dL (ref 0.0–1.2)
Total Protein: 7.5 g/dL (ref 6.5–8.1)

## 2024-02-28 LAB — CBC
HCT: 36.2 % — ABNORMAL LOW (ref 39.0–52.0)
Hemoglobin: 11.7 g/dL — ABNORMAL LOW (ref 13.0–17.0)
MCH: 33.2 pg (ref 26.0–34.0)
MCHC: 32.3 g/dL (ref 30.0–36.0)
MCV: 102.8 fL — ABNORMAL HIGH (ref 80.0–100.0)
Platelets: 234 K/uL (ref 150–400)
RBC: 3.52 MIL/uL — ABNORMAL LOW (ref 4.22–5.81)
RDW: 13.3 % (ref 11.5–15.5)
WBC: 12.6 K/uL — ABNORMAL HIGH (ref 4.0–10.5)
nRBC: 0 % (ref 0.0–0.2)

## 2024-02-28 LAB — CBG MONITORING, ED: Glucose-Capillary: 264 mg/dL — ABNORMAL HIGH (ref 70–99)

## 2024-02-28 LAB — TROPONIN I (HIGH SENSITIVITY): Troponin I (High Sensitivity): 320 ng/L (ref <18)

## 2024-02-28 NOTE — ED Provider Notes (Incomplete)
 [facility] EMERGENCY DEPARTMENT AT [facility] Provider Note   CSN: [id number] Arrival date & time: 02/28/24  2142     Patient presents with: Loss of Consciousness (Respiratory arrest /)   Luke [name] is a 78 y.o. male.  {Add pertinent medical, surgical, social history, OB history to HPI:32947} HPI     Prior to Admission medications   Medication Sig Start Date End Date Taking? Authorizing Provider  aspirin  EC 81 MG tablet Take 1 tablet (81 mg total) by mouth daily. Swallow whole. 02/11/24   Arlice Reichert, MD  carvedilol  (COREG ) 25 MG tablet TAKE 1 TABLET BY MOUTH TWICE DAILY WITH MEALS 02/19/24   Jeffrie Oneil BROCKS, MD  ceFEPime (MAXIPIME) IVPB Inject 2 g into the vein every 8 (eight) hours. Indication:  R-foot osteo First Dose: Yes Last Day of Therapy:  03/19/24 Labs - Once weekly:  CBC/D and BMP, Labs - Once weekly: ESR and CRP Method of administration: IV Push Method of administration may be changed at the discretion of home infusion pharmacist based upon assessment of the patient and/or caregiver's ability to self-administer the medication ordered. 02/10/24 03/19/24  Arlice Reichert, MD  Cholecalciferol  (VITAMIN D3) 50 MCG (2000 UT) TABS Take 2,000 Units by mouth in the morning.    [provider]  Cyanocobalamin  (VITAMIN B-12 PO) Take 2,000 mcg by mouth in the morning.    [provider]  daptomycin (CUBICIN) IVPB Inject 700 mg into the vein daily. Indication:  R-foot osteo First Dose: Yes Last Day of Therapy:  03/19/24 Labs - Once weekly:  CBC/D, BMP, and CPK Labs - Once weekly: ESR and CRP Method of administration: IV Push Method of administration may be changed at the discretion of home infusion pharmacist based upon assessment of the patient and/or caregiver's ability to self-administer the medication ordered. 02/10/24 03/19/24  Arlice Reichert, MD  glipiZIDE  (GLUCOTROL ) 5 MG tablet TAKE 1 TABLET(5 MG) BY MOUTH TWICE DAILY BEFORE A MEAL 02/17/24    Cleatus Arlyss RAMAN, MD  glucose blood (ACCU-CHEK AVIVA PLUS) test strip USE AS DIRECTED TO TEST BLOOD SUGAR TWICE DAILY 04/15/23   Cleatus Arlyss RAMAN, MD  insulin  glargine (LANTUS  SOLOSTAR) 100 UNIT/ML Solostar Pen INJECT 0.3 TO 0.35 MLS(30 TO 35 UNITS) INTO THE SKIN EVERY DAY 03/29/20   Cleatus Arlyss RAMAN, MD  metFORMIN  (GLUCOPHAGE ) 500 MG tablet TAKE 2 TABLETS BY MOUTH TWICE DAILY WITH FOOD 04/29/23   Cleatus Arlyss RAMAN, MD  Multiple Vitamin (MULTIVITAMIN WITH MINERALS) TABS tablet Take 1 tablet by mouth in the morning.    [provider]  omeprazole  (PRILOSEC) 40 MG capsule TAKE 1 CAPSULE(40 MG) BY MOUTH DAILY 08/14/23   Nandigam, Kavitha V, MD  prasugrel  (EFFIENT ) 10 MG TABS tablet Take 0.5 tablets (5 mg total) by mouth in the morning and at bedtime. 10/06/23   Cleatus Arlyss RAMAN, MD  simvastatin  (ZOCOR ) 20 MG tablet TAKE 1 TABLET(20 MG) BY MOUTH AT BEDTIME 10/13/23   Walker, Caitlin S, NP  torsemide  (DEMADEX ) 20 MG tablet Take 1 tablet (20 mg total) by mouth daily as needed (for swelling). 02/16/24   Cleatus Arlyss RAMAN, MD  Treprostinil (TYVASO DPI MAINTENANCE KIT) 64 MCG POWD Inhale 1 puff into the lungs in the morning, at noon, in the evening, and at bedtime.    [provider]  triamcinolone  ointment (KENALOG ) 0.1 % Apply topically 2 (two) times daily. 12/04/23   Iva Marty Saltness, MD  umeclidinium bromide  (INCRUSE ELLIPTA ) 62.5 MCG/ACT AEPB Inhale 1 puff into  the lungs daily. 02/23/24   Parrett, Madelin RAMAN, NP  valsartan  (DIOVAN ) 40 MG tablet Take 1 tablet (40 mg total) by mouth daily with supper. 10/08/23   Jeffrie Oneil BROCKS, MD    Allergies: Aldactone  [spironolactone ], Brilinta  [ticagrelor ], Clopidogrel , Codeine, Amoxicillin , Atorvastatin , Benadryl  [diphenhydramine ], Januvia  [sitagliptin ], Jardiance  [empagliflozin ], Lisinopril , Rosuvastatin , and Lasix  [furosemide ]    Review of Systems  Updated Vital Signs BP (!) 174/87 (BP Location: Left Arm)   Pulse 97   Temp 97.7 F (36.5 C)  (Axillary)   Resp (!) 27   SpO2 100%   Physical Exam  (all labs ordered are listed, but only abnormal results are displayed) Labs Reviewed  CBC - Abnormal; Notable for the following components:      Result Value   WBC 12.6 (*)    RBC 3.52 (*)    Hemoglobin 11.7 (*)    HCT 36.2 (*)    MCV 102.8 (*)    All other components within normal limits  CBG MONITORING, ED - Abnormal; Notable for the following components:   Glucose-Capillary 264 (*)    All other components within normal limits  COMPREHENSIVE METABOLIC PANEL WITH GFR  URINALYSIS, ROUTINE W REFLEX MICROSCOPIC  TROPONIN I (HIGH SENSITIVITY)    EKG: EKG Interpretation Date/Time:  Saturday February 28 2024 21:49:36 EDT Ventricular Rate:  99 PR Interval:  154 QRS Duration:  176 QT Interval:  417 QTC Calculation: 536 R Axis:   222  Text Interpretation: Sinus rhythm Probable left atrial enlargement Nonspecific intraventricular conduction delay Lateral infarct, recent ST depression V1-V3, suggest recording posterior leads similar to Feb 06 2024 Confirmed by Freddi Hamilton [phone]) on 02/28/2024 10:21:21 PM  Radiology: No results found.  {Document cardiac monitor, telemetry assessment procedure when appropriate:32947} Procedures   Medications Ordered in the ED - No data to display  Clinical Course as of 02/28/24 2244  Sat Feb 28, 2024  2244 Patient transitioned to 4L O2 BY [place] by this provider with maintenance of 02 sat > 95%.  [RS]    Clinical Course User Index [RS] Thera Basden, Pleasant SAUNDERS, PA-C   {Click here for ABCD2, HEART and other calculators REFRESH Note before signing:1}                              Medical Decision Making Amount and/or Complexity of Data Reviewed Labs: ordered. Radiology: ordered.   ***  {Document critical care time when appropriate  Document review of labs and clinical decision tools ie CHADS2VASC2, etc  Document your independent review of radiology images and any outside records   Document your discussion with family members, caretakers and with consultants  Document social determinants of health affecting pt's care  Document your decision making why or why not admission, treatments were needed:32947:::1}   Final diagnoses:  None    ED Discharge Orders     None

## 2024-02-28 NOTE — ED Notes (Signed)
 While removing pant pt O2 dropped to 87%, denies SOB

## 2024-02-28 NOTE — ED Notes (Signed)
 Critical troponin 320 informed to Sponseller PA-c

## 2024-02-28 NOTE — ED Triage Notes (Signed)
 Pt was walking to his car, felt short of breath, bystander reduce him to floor, however pt start changing color to cyanotic, never lost pulses but he was given CPR, fire department arrived, SPO2 was 26%, pt was given rescue breathing by Fire department for 10 minutes. [facility] pt patient in  3 litters but o2 dropped to 87%. 200ml of LR given to pt by EMS.   Pt Aox4, denies pain, able to recall his medical history but not events.   Hx of ventricular insufficiency, has a pacemaker/defibrillator and uses O2 as needed.   Vitals with EMS:  HR 102 Paced 97% 10L NRB 36 RR BP 180/100 CBG 319

## 2024-02-28 NOTE — ED Notes (Signed)
 Patient transported to X-ray

## 2024-02-29 ENCOUNTER — Other Ambulatory Visit: Payer: Self-pay

## 2024-02-29 ENCOUNTER — Encounter (HOSPITAL_COMMUNITY): Payer: Self-pay | Admitting: Internal Medicine

## 2024-02-29 ENCOUNTER — Inpatient Hospital Stay (HOSPITAL_COMMUNITY)

## 2024-02-29 DIAGNOSIS — I2510 Atherosclerotic heart disease of native coronary artery without angina pectoris: Secondary | ICD-10-CM | POA: Diagnosis not present

## 2024-02-29 DIAGNOSIS — I2489 Other forms of acute ischemic heart disease: Secondary | ICD-10-CM | POA: Diagnosis not present

## 2024-02-29 DIAGNOSIS — R55 Syncope and collapse: Secondary | ICD-10-CM | POA: Diagnosis not present

## 2024-02-29 DIAGNOSIS — R0902 Hypoxemia: Secondary | ICD-10-CM | POA: Diagnosis not present

## 2024-02-29 DIAGNOSIS — R7989 Other specified abnormal findings of blood chemistry: Secondary | ICD-10-CM | POA: Diagnosis not present

## 2024-02-29 DIAGNOSIS — J9601 Acute respiratory failure with hypoxia: Secondary | ICD-10-CM | POA: Diagnosis not present

## 2024-02-29 LAB — CBC
HCT: 34.9 % — ABNORMAL LOW (ref 39.0–52.0)
Hemoglobin: 11.8 g/dL — ABNORMAL LOW (ref 13.0–17.0)
MCH: 33.6 pg (ref 26.0–34.0)
MCHC: 33.8 g/dL (ref 30.0–36.0)
MCV: 99.4 fL (ref 80.0–100.0)
Platelets: 211 K/uL (ref 150–400)
RBC: 3.51 MIL/uL — ABNORMAL LOW (ref 4.22–5.81)
RDW: 13.4 % (ref 11.5–15.5)
WBC: 10.7 K/uL — ABNORMAL HIGH (ref 4.0–10.5)
nRBC: 0 % (ref 0.0–0.2)

## 2024-02-29 LAB — HEPATITIS PANEL, ACUTE
HCV Ab: NONREACTIVE
Hep A IgM: NONREACTIVE
Hep B C IgM: NONREACTIVE
Hepatitis B Surface Ag: NONREACTIVE

## 2024-02-29 LAB — TROPONIN I (HIGH SENSITIVITY)
Troponin I (High Sensitivity): 387 ng/L (ref <18)
Troponin I (High Sensitivity): 549 ng/L (ref <18)
Troponin I (High Sensitivity): 859 ng/L (ref <18)
Troponin I (High Sensitivity): 1146 ng/L (ref <18)
Troponin I (High Sensitivity): 947 ng/L (ref <18)

## 2024-02-29 LAB — BASIC METABOLIC PANEL WITH GFR
Anion gap: 15 (ref 5–15)
BUN: 32 mg/dL — ABNORMAL HIGH (ref 8–23)
CO2: 18 mmol/L — ABNORMAL LOW (ref 22–32)
Calcium: 9.4 mg/dL (ref 8.9–10.3)
Chloride: 103 mmol/L (ref 98–111)
Creatinine, Ser: 1.66 mg/dL — ABNORMAL HIGH (ref 0.61–1.24)
GFR, Estimated: 42 mL/min — ABNORMAL LOW (ref >60)
Glucose, Bld: 224 mg/dL — ABNORMAL HIGH (ref 70–99)
Potassium: 4.7 mmol/L (ref 3.5–5.1)
Sodium: 136 mmol/L (ref 135–145)

## 2024-02-29 LAB — URINALYSIS, ROUTINE W REFLEX MICROSCOPIC
Bilirubin Urine: NEGATIVE
Glucose, UA: 150 mg/dL — AB
Ketones, ur: NEGATIVE mg/dL
Leukocytes,Ua: NEGATIVE
Nitrite: NEGATIVE
Protein, ur: 100 mg/dL — AB
Specific Gravity, Urine: 1.011 (ref 1.005–1.030)
pH: 5 (ref 5.0–8.0)

## 2024-02-29 LAB — CBG MONITORING, ED
Glucose-Capillary: 151 mg/dL — ABNORMAL HIGH (ref 70–99)
Glucose-Capillary: 216 mg/dL — ABNORMAL HIGH (ref 70–99)

## 2024-02-29 LAB — BRAIN NATRIURETIC PEPTIDE: B Natriuretic Peptide: 543.2 pg/mL — ABNORMAL HIGH (ref 0.0–100.0)

## 2024-02-29 MED ORDER — TREPROSTINIL 48 MCG IN POWD
48.0000 ug | Freq: Four times a day (QID) | RESPIRATORY_TRACT | Status: DC
Start: 2024-02-29 — End: 2024-02-29
  Administered 2024-02-29: 48 ug via RESPIRATORY_TRACT
  Filled 2024-02-29: qty 1

## 2024-02-29 MED ORDER — VANCOMYCIN HCL 1750 MG/350ML IV SOLN
1750.0000 mg | Freq: Once | INTRAVENOUS | Status: AC
  Administered 2024-02-29: 1750 mg via INTRAVENOUS
  Filled 2024-02-29: qty 350

## 2024-02-29 MED ORDER — INSULIN GLARGINE-YFGN 100 UNIT/ML ~~LOC~~ SOLN
10.0000 [IU] | Freq: Every day | SUBCUTANEOUS | Status: DC
Start: 2024-02-29 — End: 2024-02-29
  Administered 2024-02-29: 10 [IU] via SUBCUTANEOUS
  Filled 2024-02-29: qty 0.1

## 2024-02-29 MED ORDER — IRBESARTAN 150 MG PO TABS
75.0000 mg | ORAL_TABLET | Freq: Every day | ORAL | Status: DC
Start: 2024-02-29 — End: 2024-02-29
  Administered 2024-02-29: 75 mg via ORAL
  Filled 2024-02-29: qty 1

## 2024-02-29 MED ORDER — PANTOPRAZOLE SODIUM 40 MG PO TBEC
40.0000 mg | DELAYED_RELEASE_TABLET | Freq: Every day | ORAL | Status: DC
Start: 2024-02-29 — End: 2024-02-29
  Administered 2024-02-29: 40 mg via ORAL
  Filled 2024-02-29: qty 1

## 2024-02-29 MED ORDER — ONDANSETRON HCL 4 MG PO TABS: 4.0000 mg | ORAL_TABLET | Freq: Four times a day (QID) | ORAL | Status: DC | PRN

## 2024-02-29 MED ORDER — SODIUM CHLORIDE 0.9 % IV SOLN
2.0000 g | Freq: Two times a day (BID) | INTRAVENOUS | Status: DC
  Administered 2024-02-29 (×2): 2 g via INTRAVENOUS
  Filled 2024-02-29 (×2): qty 12.5

## 2024-02-29 MED ORDER — ACETAMINOPHEN 650 MG RE SUPP: 650.0000 mg | Freq: Four times a day (QID) | RECTAL | Status: DC | PRN

## 2024-02-29 MED ORDER — INSULIN ASPART 100 UNIT/ML IJ SOLN
0.0000 [IU] | Freq: Three times a day (TID) | INTRAMUSCULAR | Status: DC
  Administered 2024-02-29: 8 [IU] via SUBCUTANEOUS
  Administered 2024-02-29: 2 [IU] via SUBCUTANEOUS

## 2024-02-29 MED ORDER — UMECLIDINIUM BROMIDE 62.5 MCG/ACT IN AEPB
1.0000 | INHALATION_SPRAY | Freq: Every day | RESPIRATORY_TRACT | Status: DC
  Filled 2024-02-29: qty 7

## 2024-02-29 MED ORDER — TREPROSTINIL 48 MCG IN POWD: 1.0000 | Freq: Four times a day (QID) | RESPIRATORY_TRACT | Status: DC

## 2024-02-29 MED ORDER — CEFEPIME IV (FOR PTA / DISCHARGE USE ONLY): 2.0000 g | Freq: Two times a day (BID) | INTRAVENOUS | Status: DC

## 2024-02-29 MED ORDER — ACETAMINOPHEN 325 MG PO TABS: 650.0000 mg | ORAL_TABLET | Freq: Four times a day (QID) | ORAL | Status: DC | PRN

## 2024-02-29 MED ORDER — PRASUGREL HCL 5 MG PO TABS: 5.0000 mg | ORAL_TABLET | Freq: Every day | ORAL | Status: DC

## 2024-02-29 MED ORDER — CEFEPIME IV (FOR PTA / DISCHARGE USE ONLY)
2.0000 g | Freq: Three times a day (TID) | INTRAVENOUS | Status: DC
Start: 2024-02-29 — End: 2024-02-29

## 2024-02-29 MED ORDER — OXYCODONE HCL 5 MG PO TABS: 5.0000 mg | ORAL_TABLET | ORAL | Status: DC | PRN

## 2024-02-29 MED ORDER — IRBESARTAN 75 MG PO TABS: 75.0000 mg | ORAL_TABLET | Freq: Every day | ORAL | 0 refills | Status: DC

## 2024-02-29 MED ORDER — TREPROSTINIL 48 MCG IN POWD: 48.0000 ug | Freq: Four times a day (QID) | RESPIRATORY_TRACT | Status: DC

## 2024-02-29 MED ORDER — CARVEDILOL 12.5 MG PO TABS
25.0000 mg | ORAL_TABLET | Freq: Two times a day (BID) | ORAL | Status: DC
  Administered 2024-02-29: 25 mg via ORAL
  Filled 2024-02-29: qty 2

## 2024-02-29 MED ORDER — ONDANSETRON HCL 4 MG/2ML IJ SOLN: 4.0000 mg | Freq: Four times a day (QID) | INTRAMUSCULAR | Status: DC | PRN

## 2024-02-29 NOTE — Care Management Obs Status (Signed)
 MEDICARE OBSERVATION STATUS NOTIFICATION   Patient Details  Name: Luke [name] MRN: [id number] Date of Birth: 1945/07/05   Medicare Observation Status Notification Given:  Yes    Corean JAYSON Canary, RN 02/29/2024, 4:57 PM

## 2024-02-29 NOTE — Consult Note (Signed)
 Cardiology Consultation:  Patient ID: [name] MRN: [id number]; DOB: December 08, 1945  Admit date: 02/28/2024 Date of Consult: 02/29/2024  Primary Care Provider: Cleatus Arlyss RAMAN, MD Primary Cardiologist: Oneil Parchment, MD  Primary Electrophysiologist:  Elspeth Sage, MD (Inactive)   Patient Profile:  Luke [name] is a 78 y.o. male with a hx of pulmonary fibrosis, CAD (occluded RCA), pulmonary hypertension, ischemic CM, CKD 3b with recovery of EF with CRT-D, PAD s/p R Fem endarterectomy, diabetes who is being seen today for the evaluation of syncope/elevated troponin at the request of Ava Swayze, DO.  History of Present Illness:  Mr. Skog presents with syncope.  He tells me last night he was going to a party at a hotel.  He apparently did not wear his oxygen .  As he is walking in the building he reports he got profoundly short of breath.  He had a pulse ox and his oxygen  level was 26%.  He felt dizzy and got back to the car.  Apparently a bystander to help get into the car.  There is mention of possible CPR being performed.  He reports no chest pain prior to the episode.  He is not having any increased welling in his legs.  He tells me he has felt back to normal.   Since arrival to [facility] he been hemodynamically stable.  Had a slight rise in serum creatinine at 1.89 that is trending down to 1.66.  BNP 543.  Initial troponin 320.  Trended up to 1,146.  Now trending down to 947.  He endorses no chest pain.  WBC 12.6.  Hemoglobin 11.7.  Liver enzymes slightly elevated with AST 172 and ALT 101.  Abdominal ultrasound negative.  BNP slightly elevated at 543.  He had an echocardiogram obtained on 02/07/2024 which shows LV function 50 to 55%.  He has moderate RV dilation with moderately reduced function.  He has known pulmonary hypertension which explains this.  Cardiac cath 10/10/2023 with occluded RCA with collateral flow.  He has a 60% mid LAD lesion that is being managed medically.  EKG shows a  sensed V paced rhythm.  ER physicians had his pacemaker interrogated which shows no episodes and normal function.  Past Medical History: Past Medical History:  Diagnosis Date   AAA (abdominal aortic aneurysm) 2011   Per vascular surgery   Angio-edema    Atrial fibrillation (HCC)    CAD (coronary artery disease)    Presumed CAD with nuclear scan October 09, 2011,  large anteroseptal MI and inferior MI. Catheterization scheduled October 15, 2011   Cardiomyopathy [facility])    Nuclear, October 09, 2011, EF 30%, multiple focal wall motion abnormalities   Chronic kidney disease    CKD3   COVID-19    2021   Diabetes mellitus    type II   GERD (gastroesophageal reflux disease)    Silent   HLD (hyperlipidemia)    Hypertension    white coat HTN-- often elevated in office and controlled on outside checks.   ICD (implantable cardioverter-defibrillator) in place    CRT-D placed March, 2014 complete heart block and k dysfunction   IPF (idiopathic pulmonary fibrosis) (HCC)    LBBB (left bundle branch block)    LBBB on EKG October 11, 2011,  no prior EKG has been done   Low testosterone    Hx of   On home oxygen  therapy    2L as needed   Pacemaker    PAD (peripheral artery disease)  Pancreatitis    Peripheral arterial disease    Umbilical hernia     Past Surgical History: Past Surgical History:  Procedure Laterality Date   ABDOMINAL AORTAGRAM N/A 10/28/2011   Procedure: ABDOMINAL EZELLA;  Surgeon: Lonni GORMAN Blade, MD;  Location: [facility] CATH LAB;  Service: Cardiovascular;  Laterality: N/A;   ABDOMINAL AORTIC ANEURYSM REPAIR     EVAR    ABDOMINAL AORTOGRAM N/A 05/27/2022   Procedure: ABDOMINAL AORTOGRAM;  Surgeon: Sheree Penne Lonni, MD;  Location: [facility] INVASIVE CV LAB;  Service: Cardiovascular;  Laterality: N/A;   ABDOMINAL AORTOGRAM N/A 02/09/2024   Procedure: ABDOMINAL AORTOGRAM;  Surgeon: Pearline Norman GORMAN, MD;  Location: [facility] INVASIVE CV LAB;  Service: Cardiovascular;  Laterality: N/A;    BI-VENTRICULAR IMPLANTABLE CARDIOVERTER DEFIBRILLATOR N/A 07/16/2012   Procedure: BI-VENTRICULAR IMPLANTABLE CARDIOVERTER DEFIBRILLATOR  (CRT-D);  Surgeon: Elspeth JAYSON Sage, MD;  Location: [facility] CATH LAB;  Service: Cardiovascular;  Laterality: N/A;   BIV ICD GENERATOR CHANGEOUT N/A 10/25/2020   Procedure: BIV ICD GENERATOR CHANGEOUT;  Surgeon: Sage Elspeth JAYSON, MD;  Location: [facility] INVASIVE CV LAB;  Service: Cardiovascular;  Laterality: N/A;   CARDIAC CATHETERIZATION     CATARACT EXTRACTION Left 2023   COLONOSCOPY  03/23/2018; 2020   CORONARY CTO INTERVENTION N/A 10/10/2022   Procedure: CORONARY CTO INTERVENTION;  Surgeon: Dann Candyce GORMAN, MD;  Location: [facility] INVASIVE CV LAB;  Service: Cardiovascular;  Laterality: N/A;   CORONARY LITHOTRIPSY N/A 09/18/2022   Procedure: CORONARY LITHOTRIPSY;  Surgeon: Dann Candyce GORMAN, MD;  Location: [facility] INVASIVE CV LAB;  Service: Cardiovascular;  Laterality: N/A;   CORONARY STENT INTERVENTION N/A 09/18/2022   Procedure: CORONARY STENT INTERVENTION;  Surgeon: Dann Candyce GORMAN, MD;  Location: [facility] INVASIVE CV LAB;  Service: Cardiovascular;  Laterality: N/A;   CORONARY ULTRASOUND/IVUS N/A 09/18/2022   Procedure: Coronary Ultrasound/IVUS;  Surgeon: Dann Candyce GORMAN, MD;  Location: [facility] INVASIVE CV LAB;  Service: Cardiovascular;  Laterality: N/A;   ENDARTERECTOMY FEMORAL Right 11/19/2022   Procedure: RIGHT COMMON FEMORAL ENDARTERECTOMY WITH VEIN PATCH ANGIOPLASTY;  Surgeon: Sheree Penne Lonni, MD;  Location: [facility] OR;  Service: Vascular;  Laterality: Right;   ESOPHAGOGASTRODUODENOSCOPY N/A 02/12/2019   Procedure: ESOPHAGOGASTRODUODENOSCOPY (EGD);  Surgeon: Charlanne Groom, MD;  Location: [facility] ENDOSCOPY;  Service: Endoscopy;  Laterality: N/A;   ESOPHAGOGASTRODUODENOSCOPY (EGD) WITH PROPOFOL  N/A 02/05/2018   Procedure: ESOPHAGOGASTRODUODENOSCOPY (EGD) WITH PROPOFOL ;  Surgeon: Shila Gustav GAILS, MD;  Location: [facility] ENDOSCOPY;  Service: Endoscopy;  Laterality: N/A;    ESOPHAGOGASTRODUODENOSCOPY (EGD) WITH PROPOFOL  N/A 09/27/2018   Procedure: ESOPHAGOGASTRODUODENOSCOPY (EGD) WITH PROPOFOL ;  Surgeon: Rollin Dover, MD;  Location: [facility] ENDOSCOPY;  Service: Endoscopy;  Laterality: N/A;   FOREIGN BODY REMOVAL  09/27/2018   Procedure: FOREIGN BODY REMOVAL;  Surgeon: Rollin Dover, MD;  Location: [facility] ENDOSCOPY;  Service: Endoscopy;;   FOREIGN BODY REMOVAL  02/12/2019   Procedure: FOREIGN BODY REMOVAL;  Surgeon: Charlanne Groom, MD;  Location: [facility] ENDOSCOPY;  Service: Endoscopy;;   HERNIA REPAIR  05/14/2013   INSERTION OF MESH N/A 05/14/2013   Procedure: INSERTION OF MESH;  Surgeon: Elspeth KYM Schultze, MD;  Location: [facility] OR;  Service: General;  Laterality: N/A;   LEFT HEART CATH N/A 10/10/2022   Procedure: Left Heart Cath;  Surgeon: Dann Candyce GORMAN, MD;  Location: [facility] INVASIVE CV LAB;  Service: Cardiovascular;  Laterality: N/A;   LEFT HEART CATH AND CORONARY ANGIOGRAPHY N/A 07/17/2021   Procedure: LEFT HEART CATH AND CORONARY ANGIOGRAPHY;  Surgeon: Burnard Debby LABOR, MD;  Location: [facility] INVASIVE CV LAB;  Service: Cardiovascular;  Laterality: N/A;   LEFT HEART CATH AND CORONARY ANGIOGRAPHY N/A 09/18/2022   Procedure: LEFT HEART CATH AND CORONARY ANGIOGRAPHY;  Surgeon: Dann Candyce RAMAN, MD;  Location: [facility] INVASIVE CV LAB;  Service: Cardiovascular;  Laterality: N/A;   LOWER EXTREMITY ANGIOGRAM Bilateral 10/28/2011   Procedure: LOWER EXTREMITY ANGIOGRAM;  Surgeon: Lonni RAMAN Blade, MD;  Location: [facility] CATH LAB;  Service: Cardiovascular;  Laterality: Bilateral;   LOWER EXTREMITY ANGIOGRAM Right 11/19/2022   Procedure: RIGHT LOWER EXTREMITY ANGIOGRAM;  Surgeon: Sheree Penne Lonni, MD;  Location: [facility] OR;  Service: Vascular;  Laterality: Right;   LOWER EXTREMITY ANGIOGRAPHY  05/27/2022   Procedure: Lower Extremity Angiography;  Surgeon: Sheree Penne Lonni, MD;  Location: [facility] INVASIVE CV LAB;  Service: Cardiovascular;;   LOWER EXTREMITY ANGIOGRAPHY Right 02/09/2024    Procedure: Lower Extremity Angiography;  Surgeon: Pearline Norman RAMAN, MD;  Location: [facility] INVASIVE CV LAB;  Service: Cardiovascular;  Laterality: Right;   PACEMAKER INSERTION  07/16/2012   pacemaker/defibrilator   PATCH ANGIOPLASTY Right 11/19/2022   Procedure: VEIN PATCH ANGIOPLASTY;  Surgeon: Sheree Penne Lonni, MD;  Location: [facility] OR;  Service: Vascular;  Laterality: Right;   POLYPECTOMY     POSTERIOR CERVICAL FUSION/FORAMINOTOMY N/A 06/09/2014   Procedure: Laminectomy - Cervical two-Cervcial four posterior cervical instrumented fusion Cervical two-cervical four;  Surgeon: Alm RAMAN Molt, MD;  Location: [facility] NEURO ORS;  Service: Neurosurgery;  Laterality: N/A;  posterior    RIGHT/LEFT HEART CATH AND CORONARY ANGIOGRAPHY N/A 10/10/2023   Procedure: RIGHT/LEFT HEART CATH AND CORONARY ANGIOGRAPHY;  Surgeon: Elmira Newman PARAS, MD;  Location: [facility] INVASIVE CV LAB;  Service: Cardiovascular;  Laterality: N/A;   SAVORY DILATION N/A 02/05/2018   Procedure: SAVORY DILATION;  Surgeon: Shila Gustav GAILS, MD;  Location: [facility] ENDOSCOPY;  Service: Endoscopy;  Laterality: N/A;   TONSILLECTOMY     as a child    UMBILICAL HERNIA REPAIR N/A 05/14/2013   Procedure: LAPAROSCOPIC exploration and repair of hernia in abdominal ;  Surgeon: Elspeth KYM Schultze, MD;  Location: [facility] OR;  Service: General;  Laterality: N/A;   UPPER GASTROINTESTINAL ENDOSCOPY  2020     Allergies:    Allergies  Allergen Reactions   Aldactone  [Spironolactone ] Other (See Comments)    Hyperkalemia   Brilinta  [Ticagrelor ] Shortness Of Breath    Numbness in hands and feet Blurry vision   Clopidogrel  Rash    Redness and Itchiness   Codeine Rash and Hives   Amoxicillin  Hives   Atorvastatin  Other (See Comments)    Myalgias with lipitor.  Does tolerate simvastatin .     Benadryl  [Diphenhydramine ] Hives   Januvia  [Sitagliptin ] Other (See Comments)    Diarrhea and heart racing   Jardiance  [Empagliflozin ] Other (See Comments)    Polyuria;  excessive weight loss   Lisinopril  Other (See Comments)    Possible cause of pancreatitis   Rosuvastatin  Other (See Comments)    myalgia   Lasix  [Furosemide ] Rash    Social History:   Social History   Socioeconomic History   Marital status: Married    Spouse name: Patti   Number of children: 1   Years of education: Not on file   Highest education level: Bachelor's degree (e.g., BA, AB, BS)  Occupational History   Occupation: retired  Tobacco Use   Smoking status: Former    Current packs/day: 0.00    Average packs/day: 2.0 packs/day for 42.0 years (84.0 ttl pk-yrs)    Types: Cigarettes    Start date: 1964    Quit date: 05/06/2004  Years since quitting: 19.8    Passive exposure: Never   Smokeless tobacco: Never  Vaping Use   Vaping status: Never Used  Substance and Sexual Activity   Alcohol use: Not Currently   Drug use: No   Sexual activity: Yes    Partners: Female  Other Topics Concern   Not on file  Social History Narrative   [place] vet, he has known service related agent orange exposure.    Retired   Community Education Officer daily.     Social Drivers of Corporate Investment Banker Strain: Low Risk  (10/02/2023)   Overall Financial Resource Strain (CARDIA)    Difficulty of Paying Living Expenses: Not hard at all  Food Insecurity: No Food Insecurity (02/29/2024)   Hunger Vital Sign    Worried About Running Out of Food in the Last Year: Never true    Ran Out of Food in the Last Year: Never true  Transportation Needs: No Transportation Needs (02/29/2024)   PRAPARE - Administrator, Civil Service (Medical): No    Lack of Transportation (Non-Medical): No  Physical Activity: Sufficiently Active (10/02/2023)   Exercise Vital Sign    Days of Exercise per Week: 5 days    Minutes of Exercise per Session: 30 min  Stress: No Stress Concern Present (10/02/2023)   [employer] of Occupational Health - Occupational Stress Questionnaire    Feeling of Stress : Not at all   Social Connections: Moderately Integrated (02/29/2024)   Social Connection and Isolation Panel    Frequency of Communication with Friends and Family: Twice a week    Frequency of Social Gatherings with Friends and Family: Once a week    Attends Religious Services: Never    Database Administrator or Organizations: Yes    Attends Banker Meetings: 1 to 4 times per year    Marital Status: Married  Catering Manager Violence: Not At Risk (02/29/2024)   Humiliation, Afraid, Rape, and Kick questionnaire    Fear of Current or Ex-Partner: No    Emotionally Abused: No    Physically Abused: No    Sexually Abused: No     Family History:    Family History  Problem Relation Age of Onset   Hypertension Mother    Stroke Mother    Hyperlipidemia Mother    Lung cancer Father    Diabetes Sister    Heart disease Sister        Before age 35   Hypertension Sister    Hyperlipidemia Sister    Heart attack Sister    Hypertension Son    Colon cancer Neg Hx    Prostate cancer Neg Hx    Esophageal cancer Neg Hx    Stomach cancer Neg Hx    Rectal cancer Neg Hx    Colon polyps Neg Hx    Pancreatic cancer Neg Hx      ROS:  All other ROS reviewed and negative. Pertinent positives noted in the HPI.     Physical Exam/Data:   Vitals:   02/29/24 0900 02/29/24 0925 02/29/24 1015 02/29/24 1026  BP: (!) 142/72 (!) 150/80 139/65   Pulse: 75 88 71   Resp: (!) 23 (!) 23 20   Temp:    98.2 F (36.8 C)  TempSrc:    Oral  SpO2: 95% 93% 95%     Intake/Output Summary (Last 24 hours) at 02/29/2024 1222 Last data filed at 02/29/2024 0546 Gross per 24 hour  Intake 100 ml  Output --  Net 100 ml       02/25/2024    1:55 PM 02/23/2024    3:19 PM 02/16/2024    1:57 PM  Last 3 Weights  Weight (lbs) 177 lb 177 lb 176 lb  Weight (kg) 80.287 kg 80.287 kg 79.833 kg    There is no height or weight on file to calculate BMI.  General: Well nourished, well developed, in no acute  distress Head: Atraumatic, normal size  Eyes: PEERLA, EOMI  Neck: Supple, no JVD Endocrine: No thryomegaly Cardiac: Normal S1, S2; RRR; no murmurs, rubs, or gallops Lungs: Clear to auscultation bilaterally, no wheezing, rhonchi or rales  Abd: Soft, nontender, no hepatomegaly  Ext: No edema, pulses 2+ Musculoskeletal: No deformities, BUE and BLE strength normal and equal Skin: Warm and dry, no rashes   Neuro: Alert and oriented to person, place, time, and situation, CNII-XII grossly intact, no focal deficits  Psych: Normal mood and affect   EKG:  The EKG was personally reviewed and demonstrates:  BiV paced HR 77 bpm Telemetry:  Telemetry was personally reviewed and demonstrates:  V paced 70s  Relevant CV Studies: TTE 02/07/2024  1. Left ventricular ejection fraction, by estimation, is 50 to 55%. The  left ventricle has low normal function. Left ventricular endocardial  border not optimally defined to evaluate regional wall motion. Left  ventricular diastolic function could not be  evaluated. There is the interventricular septum is flattened in systole  and diastole, consistent with right ventricular pressure and volume  overload.   2. Right ventricular systolic function is moderately reduced. The right  ventricular size is moderately enlarged. Tricuspid regurgitation signal is  inadequate for assessing PA pressure.   3. Right atrial size was mildly dilated.   4. The mitral valve is normal in structure. Trivial mitral valve  regurgitation. No evidence of mitral stenosis.   5. Tricuspid valve regurgitation is moderate.   6. The aortic valve has an indeterminant number of cusps. Aortic valve  regurgitation is not visualized. No aortic stenosis is present.   7. The inferior vena cava is normal in size with <50% respiratory  variability, suggesting right atrial pressure of 8 mmHg.   LHC 10/10/2023 Coronary angiography 10/10/2023: LM: Normal LAD: Prox LAD stent, no restenosis           50-60% mid vessel disease (unchanged since 09/2022) Lcx: Tortuous, no significant disease RCA: Ostially occluded within prior stent (New since 10/2022)           Left-to-right collaterals from LAD and Lcx up to the occluded prox RCA stent  Assessment and Plan:   # Syncope - Suspect this was triggered by hypoxic episode.  He tells me he was not wearing his oxygen  and his oxygen  level was 26%.  He describes no chest pain prior to the episode. - His pacemaker was interrogated.  No arrhythmias noted.  - I do not think this needs further workup.  I encouraged him to wear his oxygen .  # Elevated troponin # Demand ischemia # Pulmonary hypertension # Idiopathic pulmonary fibrosis # Right heart failure - Troponins are elevated which is not surprising.  He has a known occluded RCA.  He has a 60% mid LAD lesion.  He describes no chest pain or trouble breathing prior to the episodes.  His EKG is unchanged.  Troponins are elevated but given lack of symptoms and known history of CAD this represents demand.  This does not need to be treated as an  acute coronary syndrome. - From my standpoint no further evaluation is needed. - Would recommend to continue home prasugrel .  Should consider a different agent given age above 61.  I will defer this to his outpatient cardiologist knows him best. - Continue carvedilol .  No other change in medications.  # CAD - Known CTO of RCA.  60% mid LAD lesion.  Elevated troponin is related to demand.  Continue home regimen.  # Chronic systolic heart failure with recovered ejection fraction EF 50 to 55% # Cor pulmonale # Pulmonary hypertension - Continue home pulmonary hypertension agents.  No signs of decompensated heart failure.  I am not going to change any medications.  # AKI # Elevated liver enzymes - Secondary to hypoxic episode.  Kidney function improving.  Liver ultrasound normal.  [facility] will sign off.   The patient is ready for discharge today  from a cardiac standpoint. Medication Recommendations: No change recommended. Other recommendations (labs, testing, etc): None Follow up as an outpatient: 2 to 4 weeks with outpatient cardiologist  For questions or updates, please contact [facility] Please consult [email or web address] for contact info under      Signed, Darryle T. Barbaraann, MD, [facility]  [facility]  02/29/2024 12:22 PM

## 2024-02-29 NOTE — Assessment & Plan Note (Signed)
 Continue statins

## 2024-02-29 NOTE — ED Notes (Signed)
 Patient ambulated to the restroom with assistance. RN offered bedpan but patient declined.  Oxygen  saturation drop to high 80-90% on ambulation without oxygen  on.

## 2024-02-29 NOTE — ED Notes (Signed)
 Luke [name] from [facility] called with pacemaker interrogation results, call was transferred to [facility]

## 2024-02-29 NOTE — Evaluation (Signed)
 Physical Therapy Evaluation Patient Details Name: Luke [name] MRN: [id number] DOB: 10/17/1945 Today's Date: 02/29/2024  History of Present Illness  Pt is 78 yo presenting to [facility] on 10/25 due to hypoxia with O2 sats 26%. Pt had bystander CPR and rescue breathing from Fire department before EMS arrived. Pt with elevated troponin. PMH:AAA, afib, chronic systolic CHF with AICD, stage 3 CKD, DM, HTN, HLD, IPF not on home O2,  and PAD  Clinical Impression  Pt is currently presenting at Mod I for bed mobility, Min A to Mod A for sit to stand and CGA for gait without an AD. Pt compensates for balance with gait with quick stepping. O2 sats down to 84% with short non-functional in-home distances. Pt has O2 at home and spouse can assist. Due to pt current functional status, home set up and available assistance at home recommending skilled physical therapy services 3x/week in order to address strength, balance and functional mobility to decrease risk for falls, injury and re-hospitalization.           If plan is discharge home, recommend the following: A little help with walking and/or transfers;Assist for transportation;Assistance with cooking/housework;Help with stairs or ramp for entrance;Supervision due to cognitive status     Equipment Recommendations Rolling walker (2 wheels);Rollator (4 wheels) (RW vs Rollator pt may decline both)     Functional Status Assessment Patient has had a recent decline in their functional status and demonstrates the ability to make significant improvements in function in a reasonable and predictable amount of time.     Precautions / Restrictions Precautions Precautions: Fall;Other (comment) Recall of Precautions/Restrictions: Impaired Precaution/Restrictions Comments: O2 sats Restrictions Weight Bearing Restrictions Per Provider Order: No      Mobility  Bed Mobility Overal bed mobility: Modified Independent      Transfers Overall transfer level: Needs  assistance Equipment used: None Transfers: Sit to/from Stand Sit to Stand: Min assist, Mod assist           General transfer comment: pt declined AD. Pt with 2x LOB posteriorly resulted in pt sitting back on stretcher with Min A from physical therapist to prevent fall. Pt able to stabilize on last attempt with Min A and wide BOS using UE to stabilize on computer counter. Pt wanting to transfer to toilet by himself; due to previous balance issues with sit tos tand therapist went with pt and pt with loss of balance posteriorly trying to sit on toilet requiring Mod A from physical therapist to prevent fall posteriorly in the bathroom where pt would have hit the toilet going to the floor. Pt with very poor safety awareness.    Ambulation/Gait Ambulation/Gait assistance: Contact guard assist Gait Distance (Feet): 40 Feet Assistive device: None Gait Pattern/deviations: Step-through pattern, Trunk flexed, Wide base of support Gait velocity: quick step Gait velocity interpretation: <1.8 ft/sec, indicate of risk for recurrent falls   General Gait Details: quick step pattern with slight trunk flexion with pt using momentum of speed for balance when ambulating. Pt had to be convinced to wear O2 while ambulating to the bathroom. O2 sats down to 84% on 4.5 L O2 via [place] with ambulating     Balance Overall balance assessment: Needs assistance Sitting-balance support: Single extremity supported, Feet supported, Feet unsupported Sitting balance-Leahy Scale: Fair     Standing balance support: No upper extremity supported, During functional activity Standing balance-Leahy Scale: Poor Standing balance comment: pt with 3x loss of balance throughout session requiring Min to Mod A from physical  therapist.       Pertinent Vitals/Pain Pain Assessment Pain Assessment: No/denies pain    Home Living Family/patient expects to be discharged to:: Private residence Living Arrangements: Spouse/significant  other Available Help at Discharge: Family;Available 24 hours/day Type of Home: House Home Access: Stairs to enter Entrance Stairs-Rails: Doctor, General Practice of Steps: 3   Home Layout: One level Home Equipment: Grab bars - tub/shower;Shower seat;Cane - single point Additional Comments: Pt has O2 at night    Prior Function Prior Level of Function : Independent/Modified Independent;Driving             Mobility Comments: Reports he does not use an AD ADLs Comments: Ind with ADLs, IADLs, drives     Extremity/Trunk Assessment   Upper Extremity Assessment Upper Extremity Assessment: Overall WFL for tasks assessed;Generalized weakness    Lower Extremity Assessment Lower Extremity Assessment: Overall WFL for tasks assessed;Generalized weakness    Cervical / Trunk Assessment Cervical / Trunk Assessment: Kyphotic  Communication   Communication Communication: No apparent difficulties    Cognition Arousal: Alert Behavior During Therapy: WFL for tasks assessed/performed   PT - Cognitive impairments: Safety/Judgement       PT - Cognition Comments: pt has very poor safety awareness and understanding of the issues involved with decreased O2 sats Following commands: Intact       Cueing Cueing Techniques: Verbal cues     General Comments General comments (skin integrity, edema, etc.): spouse present during session. HR WNL. O2 sats 84% on 4.5 L O2 via [place] with ambulation        Assessment/Plan    PT Assessment Patient needs continued PT services  PT Problem List Decreased strength;Decreased balance;Decreased activity tolerance;Decreased mobility;Cardiopulmonary status limiting activity       PT Treatment Interventions DME instruction;Functional mobility training;Balance training;Gait training;Stair training;Therapeutic exercise;Therapeutic activities;Patient/family education    PT Goals (Current goals can be found in the Care Plan section)  Acute Rehab  PT Goals Patient Stated Goal: to improve balance. PT Goal Formulation: With patient Time For Goal Achievement: 03/14/24 Potential to Achieve Goals: Fair    Frequency Min 2X/week        AM-PAC PT 6 Clicks Mobility  Outcome Measure Help needed turning from your back to your side while in a flat bed without using bedrails?: None Help needed moving from lying on your back to sitting on the side of a flat bed without using bedrails?: None Help needed moving to and from a bed to a chair (including a wheelchair)?: A Little Help needed standing up from a chair using your arms (e.g., wheelchair or bedside chair)?: A Little Help needed to walk in hospital room?: A Little Help needed climbing 3-5 steps with a railing? : A Lot 6 Click Score: 19    End of Session Equipment Utilized During Treatment: Gait belt Activity Tolerance: Patient tolerated treatment well Patient left: in bed;with call bell/phone within reach;with family/visitor present Nurse Communication: Mobility status PT Visit Diagnosis: Unsteadiness on feet (R26.81);Other abnormalities of gait and mobility (R26.89)    Time: 8788-8765 PT Time Calculation (min) (ACUTE ONLY): 23 min   Charges:   PT Evaluation $PT Eval Low Complexity: 1 Low PT Treatments $Therapeutic Activity: 8-22 mins PT General Charges $$ ACUTE PT VISIT: 1 Visit        Dorothyann Maier, DPT, CLT  Acute Rehabilitation Services Office: [phone] (Secure chat preferred)   Dorothyann VEAR Maier 02/29/2024, 2:23 PM

## 2024-02-29 NOTE — Assessment & Plan Note (Signed)
 Chronic. Patient was not wearing oxygen  as prescribed for his ILD.

## 2024-02-29 NOTE — ED Notes (Signed)
 Patient transported to Ultrasound

## 2024-02-29 NOTE — ED Notes (Signed)
 Patient taking home meds (Tyvaso DPI)  in his room, that he states that pharmacy has approved.  Admitting MD notified.

## 2024-02-29 NOTE — Progress Notes (Signed)
 Pharmacy Antibiotic Note  Luke [name] is a 78 y.o. male admitted on 02/28/2024 with acute respiratory failure, to continue ABX for osteomyelitis.  Pharmacy has been consulted for vancomycin dosing.  Since discharge pt has been on daptomycin and cefepime.  Pt w/ AKI; baseline SCr ~1.1, now 1.89.  Plan: Vancomycin 1750mg  IV x1; monitor SCr +/- vanc levels prior to redosing. Change cefepime to Q12H given renal function.  Temp (24hrs), Avg:97.7 F (36.5 C), Min:97.7 F (36.5 C), Max:97.7 F (36.5 C)  Recent Labs  Lab 02/23/24 1607 02/26/24 1314 02/28/24 2218  WBC 11.8*  --  12.6*  CREATININE 1.47 1.65* 1.89*    Estimated Creatinine Clearance: 37 mL/min (A) (by C-G formula based on SCr of 1.89 mg/dL (H)).    Allergies  Allergen Reactions   Aldactone  [Spironolactone ] Other (See Comments)    Hyperkalemia   Brilinta  [Ticagrelor ] Shortness Of Breath    Numbness in hands and feet Blurry vision   Clopidogrel  Rash    Redness and Itchiness   Codeine Rash and Hives   Amoxicillin  Hives   Atorvastatin  Other (See Comments)    Myalgias with lipitor.  Does tolerate simvastatin .     Benadryl  [Diphenhydramine ] Hives   Januvia  [Sitagliptin ] Other (See Comments)    Diarrhea and heart racing   Jardiance  [Empagliflozin ] Other (See Comments)    Polyuria; excessive weight loss   Lisinopril  Other (See Comments)    Possible cause of pancreatitis   Rosuvastatin  Other (See Comments)    myalgia   Lasix  [Furosemide ] Rash    Thank you for allowing pharmacy to be a part of this patient's care.  Marvetta Dauphin, PharmD, BCPS  02/29/2024 2:25 AM

## 2024-02-29 NOTE — Assessment & Plan Note (Addendum)
 Noted. The elevation in troponins was felt to be due to demand supply mismatch of O2 given the patient's severe hypoxia.Cardiology has recommended that he continue his previously prescribed heart medication.

## 2024-02-29 NOTE — Assessment & Plan Note (Signed)
 Due to severe hypoxia. Patient was not wearing O2 as prescrived.

## 2024-02-29 NOTE — Care Management (Signed)
 Transition of Care [facility]) - Inpatient Brief Assessment   Patient Details  Name: Luke [name] MRN: [id number] Date of Birth: 1946/01/29  Transition of Care [facility] CM/SW Contact:    Corean JAYSON Canary, RN Phone Number: 02/29/2024, 4:37 PM   Clinical Narrative: Patient presented with [facility] was not wearing oxygen  while going to event. The patient received infusion therapy antibiotics with [facility] home health RN. Added HH PT  Cory B accepted.  Pam from [employer] aware of patient discharge.    Transition of Care Asessment: Insurance and Status: Insurance coverage has been reviewed Patient has primary care physician: Yes Home environment has been reviewed: LIves with Spouse Prior level of function:: Independent Prior/Current Home Services: Current home services Social Drivers of Health Review: SDOH reviewed no interventions necessary Readmission risk has been reviewed: Yes Transition of care needs: transition of care needs identified, TOC will continue to follow

## 2024-02-29 NOTE — Progress Notes (Addendum)
 Patient's own Treprostinil delivered to Main pharmacy.   Patient to keep inhalation device at side. Main pharmacy to dispense 1 package (4 doses) daily to be kept on unit.  Dorn Buttner, PharmD, BCPS 02/29/2024 10:55 AM ED Clinical Pharmacist -  [phone]  **ADDENDUM 02/29/24 12:12 PM** Patient's daily supply (one 4 count pack) treprostinil currently being kept in daily med bin in [facility] until patient transfer to floor. The remaining 4 count pack is being stored in [employer].  Dorn Buttner, PharmD, BCPS 02/29/2024 12:12 PM ED Clinical Pharmacist -  [phone]

## 2024-02-29 NOTE — Assessment & Plan Note (Signed)
 Noted. The patient will be returned to his chronic glucose control regimen at home.

## 2024-02-29 NOTE — ED Notes (Signed)
 Treprostinil POWD Medication taken and sent to the pharmacy.

## 2024-02-29 NOTE — Discharge Summary (Signed)
 Physician Discharge Summary   Patient: Luke [name] MRN: [id number] DOB: 1946/02/11  Admit date:     02/28/2024  Discharge date: 02/29/24  Discharge Physician: Brigida Bureau   PCP: Cleatus Arlyss RAMAN, MD   Recommendations at discharge:    Discharge to home with home health for IV antibiotics and PT. Continue IV antibiotics as previously prescribed. Follow up with PCP in 7-10 days. Use O2 at all times. Keep all appointments relateed to IV antibiotics.  Discharge Diagnoses: Principal Problem:   Hypoxia Elevated Troponins due to demand/supply mismatch.  Resolved Problems:   * No resolved hospital problems. [facility] Course: The patient is a 78 yr old man who is on Oxygen  at home 2L, but went to a party and did not bring his O2 with him. He states that he developed shortness of breath, and went out to the care to get his oxygen . He became lightheaded and passed out. He was found down and cyanotic by his car. He was given CPR (although he had no loss of pulse). EMS arrived and found his O2 saturation to be 26%. He presented to the ED with a Tronponin initially of 320. EKG demonstrated ischemic changes compared to previous EKG's. He denied chest pain. Overnight it increased to 387, then 549, 850, and 1146. Cardiology was consulted. His liver enzymes were also increased. RUQ ultrasound demonstrated no acute findings.  CXR was obtained that demonstrated no acute findings, but did demonstrate stable chronic lung disease due to his ILD.   Cardiology evaluated the patient. They determined that syncope was due to hypoxic episode. The patient's pacemaker was interrogated. There were no arrhythmias noted.  They felt that the patient's troponins were elevated due to demand/supply mismatch given the patient's severe hypoxia. The patient is known to have CTRA or the RCA with 60% of mid-LAd lesion. Recommendation is for him to continue his home regimen.  He is also to continue his home pulmonary  hypertension agents. There was no sign of decompensated heart failure.   Elevated liver enzymes were likely due to the hypoxic episode. The liver ultrasound was normal.  The patient presented with an elevation of creatinine which is now resolving.  The patient is feeling like his usual self. He has been cleared for discharge to home. He will be discharged to home in fair condition.  Assessment and Plan:  Syncope and collapse Due to severe hypoxia. Patient was not wearing O2 as prescrived.  Hypoxia Chronic. Patient was not wearing oxygen  as prescribed for his ILD.  CKD (chronic kidney disease) stage 3, GFR 30-59 ml/min (HCC) Noted. Creatinine is improved since presentation.  CAD (coronary artery disease) Noted. The elevation in troponins was felt to be due to demand supply mismatch of O2 given the patient's severe hypoxia.Cardiology has recommended that he continue his previously prescribed heart medication.  Biventricular ICD (implantable cardioverter-defibrillator) in place Interrogated. No arrhythmia detected.  Acute osteomyelitis of right foot (HCC) Patient is to continue cefepime as previously prescribed. He is to keep all appointments with infectious disease.  IPF (idiopathic pulmonary fibrosis) (HCC) Noted. This is the cause for the patient's hypoxia. He has recently been started on Tyvano. He states that he thinks that he has not been feeling as well since he was started on this. He will discuss with pulmonology.  Chronic HFrEF (heart failure with reduced ejection fraction) (HCC) Noted.   Type 2 diabetes mellitus with hyperlipidemia (HCC) Noted. The patient will be returned to his chronic glucose control regimen  at home.   HLD (hyperlipidemia) Continue statins.       Consultants: Cardiology Procedures performed: None  Disposition: Home Diet recommendation:  Discharge Diet Orders (From admission, onward)     Start     Ordered   02/29/24 0000  Diet - low  sodium heart healthy        02/29/24 1530           Cardiac diet DISCHARGE MEDICATION: Allergies as of 02/29/2024       Reactions   Aldactone  [spironolactone ] Other (See Comments)   Hyperkalemia   Brilinta  [ticagrelor ] Shortness Of Breath   Numbness in hands and feet Blurry vision   Clopidogrel  Rash   Redness and Itchiness   Codeine Rash, Hives   Amoxicillin  Hives   Atorvastatin  Other (See Comments)   Myalgias with lipitor.  Does tolerate simvastatin .     Benadryl  [diphenhydramine ] Hives   Januvia  [sitagliptin ] Other (See Comments)   Diarrhea and heart racing   Jardiance  [empagliflozin ] Other (See Comments)   Polyuria; excessive weight loss   Lisinopril  Other (See Comments)   Possible cause of pancreatitis   Rosuvastatin  Other (See Comments)   myalgia   Lasix  [furosemide ] Rash        Medication List     STOP taking these medications    aspirin  EC 81 MG tablet   torsemide  20 MG tablet Commonly known as: DEMADEX    valsartan  40 MG tablet Commonly known as: DIOVAN  Replaced by: irbesartan  75 MG tablet       TAKE these medications    Accu-Chek Aviva Plus test strip Generic drug: glucose blood USE AS DIRECTED TO TEST BLOOD SUGAR TWICE DAILY   carvedilol  25 MG tablet Commonly known as: COREG  TAKE 1 TABLET BY MOUTH TWICE DAILY WITH MEALS   ceFEPime IVPB Commonly known as: MAXIPIME Inject 2 g into the vein every 8 (eight) hours. Indication:  R-foot osteo First Dose: Yes Last Day of Therapy:  03/19/24 Labs - Once weekly:  CBC/D and BMP, Labs - Once weekly: ESR and CRP Method of administration: IV Push Method of administration may be changed at the discretion of home infusion pharmacist based upon assessment of the patient and/or caregiver's ability to self-administer the medication ordered.   daptomycin IVPB Commonly known as: CUBICIN Inject 700 mg into the vein daily. Indication:  R-foot osteo First Dose: Yes Last Day of Therapy:  03/19/24 Labs -  Once weekly:  CBC/D, BMP, and CPK Labs - Once weekly: ESR and CRP Method of administration: IV Push Method of administration may be changed at the discretion of home infusion pharmacist based upon assessment of the patient and/or caregiver's ability to self-administer the medication ordered.   glipiZIDE  5 MG tablet Commonly known as: GLUCOTROL  TAKE 1 TABLET(5 MG) BY MOUTH TWICE DAILY BEFORE A MEAL   Incruse Ellipta  62.5 MCG/ACT Aepb Generic drug: umeclidinium bromide  Inhale 1 puff into the lungs daily.   irbesartan  75 MG tablet Commonly known as: AVAPRO  Take 1 tablet (75 mg total) by mouth daily. Start taking on: March 01, 2024 Replaces: valsartan  40 MG tablet   Lantus  SoloStar 100 UNIT/ML Solostar Pen Generic drug: insulin  glargine INJECT 0.3 TO 0.35 MLS(30 TO 35 UNITS) INTO THE SKIN EVERY DAY   metFORMIN  500 MG tablet Commonly known as: GLUCOPHAGE  TAKE 2 TABLETS BY MOUTH TWICE DAILY WITH FOOD   multivitamin with minerals Tabs tablet Take 1 tablet by mouth in the morning.   omeprazole  40 MG capsule Commonly known as: PRILOSEC TAKE 1  CAPSULE(40 MG) BY MOUTH DAILY   prasugrel  10 MG Tabs tablet Commonly known as: EFFIENT  Take 0.5 tablets (5 mg total) by mouth in the morning and at bedtime.   simvastatin  20 MG tablet Commonly known as: ZOCOR  TAKE 1 TABLET(20 MG) BY MOUTH AT BEDTIME   Treprostinil 48 MCG Powd Inhale 1 puff into the lungs in the morning, at noon, in the evening, and at bedtime.   VITAMIN B-12 PO Take 2,000 mcg by mouth in the morning.   Vitamin D3 50 MCG (2000 UT) Tabs Take 2,000 Units by mouth in the morning.        Follow-up Information     Darryle Thom CROME, PA-C Follow up.   Specialty: Cardiology Why: Davene Nicolas - follow-up with PA Thom on  Thursday November 13 10:15 AM (Arrive by 9:55 AM). Thom works with Dr. Jeffrie and the cardiology team. Contact information: [address] [place] [address] [phone]          [facility] Follow up.   Specialty: Home Health Services Why: They will call to set up PT services  in addition to RN Contact information: [address] [place] [address] [phone]                Discharge Exam: There were no vitals filed for this visit. Exam:  Constitutional:  The patient is awake, alert, and oriented x 3. No acute distress. Eyes:  pupils and irises appear normal Normal lids and conjunctivae ENMT:  grossly normal hearing  Lips appear normal external ears, nose appear normal Oropharynx: mucosa, tongue,posterior pharynx appear normal Neck:  neck appears normal, no masses, normal ROM, supple no thyromegaly Respiratory:  No increased work of breathing. No wheezes, rales, or rhonchi No tactile fremitus Cardiovascular:  Regular rate and rhythm No murmurs, ectopy, or gallups. No lateral PMI. No thrills. Abdomen:  Abdomen is soft, non-tender, non-distended No hernias, masses, or organomegaly Normoactive bowel sounds.  Musculoskeletal:  No cyanosis, clubbing, or edema Skin:  No rashes, lesions, ulcers palpation of skin: no induration or nodules Neurologic:  CN 2-12 intact Sensation all 4 extremities intact Psychiatric:  Mental status Mood, affect appropriate Orientation to person, place, time  judgment and insight appear intact   Condition at discharge: fair  The results of significant diagnostics from this hospitalization (including imaging, microbiology, ancillary and laboratory) are listed below for reference.   Imaging Studies: US  Abdomen Limited RUQ (LIVER/GB) Result Date: 02/29/2024 EXAM: Right Upper Quadrant Abdominal Ultrasound TECHNIQUE: Real-time ultrasonography of the right upper quadrant of the abdomen was performed. COMPARISON: None. CLINICAL HISTORY: Elevated LFTs. FINDINGS: LIVER: The liver demonstrates normal echogenicity. No intrahepatic biliary ductal dilatation. No mass. BILIARY SYSTEM: No  evidence of pericholecystic fluid or wall thickening. No cholelithiasis. Common bile duct measures 3-4 mm. RIGHT KIDNEY: The right kidney is grossly unremarkable in appearances without evidence of hydronephrosis, echogenic calculi or worrisome mass lesions. PANCREAS: Visualized portions of the pancreas are unremarkable. OTHER: No right upper quadrant ascites. IMPRESSION: 1. No acute findings. Electronically signed by: Dorethia Molt MD 02/29/2024 03:36 AM EDT RP Workstation: [id number]   DG Chest 2 View Result Date: 02/28/2024 EXAM: 2 VIEW(S) XRAY OF THE CHEST 02/28/2024 11:27:26 PM COMPARISON: 02/06/2024 CLINICAL HISTORY: CPR performed. walking to his car, felt short of breath, bystander reduce him to floor, however pt start changing color to cyanotic, never lost pulses but he was given CPR FINDINGS: LINES, TUBES AND DEVICES: Right PICC line in place with tip in  mid SVC. Left subclavian ICD. LUNGS AND PLEURA: Emphysema. Subpleural patchy opacities in the mid to lower lungs, reflecting fibrosis when correlating with prior CT. No pulmonary edema. No pleural effusion. No pneumothorax. HEART AND MEDIASTINUM: No acute abnormality of the cardiac and mediastinal silhouettes. BONES AND SOFT TISSUES: No acute osseous abnormality. IMPRESSION: 1. No acute findings. 2. Stable chronic lung disease/fibrosis. Electronically signed by: Pinkie Pebbles MD 02/28/2024 11:31 PM EDT RP Workstation: [id number]   US  EKG SITE RITE Result Date: 02/10/2024 If Site Rite image not attached, placement could not be confirmed due to current cardiac rhythm.  VAS US  LOWER EXTREMITY SAPHENOUS VEIN MAPPING Result Date: 02/09/2024 LOWER EXTREMITY VEIN MAPPING Patient Name:  Luke [name]  Date of Exam:   02/09/2024 Medical Rec #: [id number]         Accession #:    [id number] Date of Birth: [birth year]          Patient Gender: M Patient Age:   65 years Exam Location:  [facility] Procedure:      VAS US  LOWER EXTREMITY SAPHENOUS VEIN  MAPPING Referring Phys: PENNE COLORADO --------------------------------------------------------------------------------  Indications: Pre-op History:     History of PAD; patient is pre-operative for lower extremity bypass              graft. RLE GSV harvest on 11/19/2022 (RLE endarterectomy w/ vein              patch angioplasty)  Comparison Study: Previous exam on 05/27/2022 Performing Technologist: Ezzie Potters RVT, RDMS  Examination Guidelines: A complete evaluation includes B-mode imaging, spectral Doppler, color Doppler, and power Doppler as needed of all accessible portions of each vessel. Bilateral testing is considered an integral part of a complete examination. Limited examinations for reoccurring indications may be performed as noted. +--------------+--------------+-----------------------+------------+-----------+  RT Diameter   RT Findings            GSV          LT Diameter LT Findings      (cm)                                              (cm)                +--------------+--------------+-----------------------+------------+-----------+               not visualizedSaphenofemoral Junction                        +--------------+--------------+-----------------------+------------+-----------+               not visualized    Proximal thigh                             +--------------+--------------+-----------------------+------------+-----------+  0.15 / 0.32    branching          Mid thigh                               +--------------+--------------+-----------------------+------------+-----------+      0.33                        Distal thigh                              +--------------+--------------+-----------------------+------------+-----------+  0.26                            Knee                                  +--------------+--------------+-----------------------+------------+-----------+      0.24                          Prox calf                                +--------------+--------------+-----------------------+------------+-----------+      0.26                          Mid calf                                +--------------+--------------+-----------------------+------------+-----------+      0.23                         Distal calf                              +--------------+--------------+-----------------------+------------+-----------+      0.23                            Ankle                                 +--------------+--------------+-----------------------+------------+-----------+ Diagnosing physician: Fonda Rim Electronically signed by Fonda Rim on 02/09/2024 at 3:21:33 PM.    Final    PERIPHERAL VASCULAR CATHETERIZATION Result Date: 02/09/2024 Images from the original result were not included. Patient name: Luke [name] MRN: [id number] DOB: 1945/09/13 Sex: male 02/09/2024 Pre-operative Diagnosis: Chronic limb threatening ischemia of the right leg related to Post-operative diagnosis:  Same Surgeon:  Norman GORMAN Serve, MD Procedure Performed: Ultrasound-guided access of left common femoral artery Ultrasound-guided access of right PT artery Third order cannulation of right common femoral artery Aortogram and right lower extremity angiogram Pro-glide closure of left common femoral artery 59 minutes of moderate sedation with fentanyl  and Versed  Indications: Mr. Deavers is a 78 year old male who was admitted with a right toe wound and osteomyelitis.  He had a previous EVAR done.  His ABI  demonstrated significant disease on the right with a toe pressure of 33.  Angiogram was offered, risks and benefits were reviewed and he elected to proceed. Findings: Widely patent aortic endograft and bilateral renal arteries.  Widely patent EVAR limbs and iliac systems. Patent right common femoral endarterectomy site with widely patent profunda.  The SFA is diffusely diseased and is chronically occluded at its mid segment.  An [place]  of above-knee popliteal is reconstituted that is severely diseased and the below-knee popliteal artery is completely occluded as well as the TP trunk with reconstitution of the AT PT and peroneal arteries from collaterals.  Procedure:  The patient was identified in the holding area and taken to the cath lab  The patient was then placed supine on the table and prepped and draped  in the usual sterile fashion.  A time out was called.  Ultrasound was used to evaluate the left common femoral artery.  It was patent .  A digital ultrasound image was acquired.  A micropuncture needle was used to access the left common femoral artery under ultrasound guidance.  An 018 wire was advanced without resistance and a micropuncture sheath was placed.  The 018 wire was removed and a benson wire was placed.  The micropuncture sheath was exchanged for a 5 french sheath.  An omniflush catheter was advanced over the wire to the level of L-1.  An abdominal angiogram was obtained.  Next, using a 6-1/2 Aptus tour guide and a glide advantage wire I was able to navigate up and over the flow divider and into the right iliac limb.  The wire was placed into the right SFA and then a Nava cross catheter was placed over this wire into the right femoral artery.  The patient was then systemically heparinized.  A right lower extremity angiogram was then obtained and this demonstrated the above findings.  Given the significant disease and the previous EVAR elected to try to cross these lesions from a retrograde fashion.  Under ultrasound guidance the PT artery was accessed with and the 4 French pedal sheath was placed.  Using an 014 command wire and a CXI catheter I attempted to navigate across the TP trunk and popliteal occlusions although this was unsuccessful in regaining true lumen access.  Given the significance of his disease at this time the case was terminated.  The retrograde wire and catheter were removed and the sheath was removed with  manual pressure for hemostasis.  The left groin access was closed with a Pro-glide with excellent hemostasis. Impression: Multilevel disease, would require right common femoral to PT artery bypass for revascularization Norman GORMAN Serve MD [facility] Office: [phone]  VAS US  LOWER EXTREMITY VENOUS (DVT) Result Date: 02/09/2024  Lower Venous DVT Study Patient Name:  Luke [name]  Date of Exam:   02/06/2024 Medical Rec #: [id number]         Accession #:    [id number] Date of Birth: Oct 05, 1945          Patient Gender: M Patient Age:   30 years Exam [facility]  [facility] Procedure:      VAS US  LOWER EXTREMITY VENOUS (DVT) Referring Phys: EKTA PATEL --------------------------------------------------------------------------------  Indications: Edema. Other Indications: DM foot infections. Comparison Study: Previous exam on 10/05/2022 was negative for DVT Performing Technologist: Ezzie Potters RVT, RDMS  Examination Guidelines: A complete evaluation includes B-mode imaging, spectral Doppler, color Doppler, and power Doppler as needed of all accessible portions of each vessel. Bilateral testing is considered an integral part of a complete examination. Limited examinations for reoccurring indications may be performed as noted. The reflux portion of the exam is performed with the patient in reverse Trendelenburg.  +---------+---------------+---------+-----------+----------+--------------+ RIGHT    CompressibilityPhasicitySpontaneityPropertiesThrombus Aging +---------+---------------+---------+-----------+----------+--------------+ CFV      Full           Yes      Yes                                 +---------+---------------+---------+-----------+----------+--------------+ SFJ  not visualized +---------+---------------+---------+-----------+----------+--------------+ FV Prox  Full           Yes      Yes                                  +---------+---------------+---------+-----------+----------+--------------+ FV Mid   Full           Yes      Yes                                 +---------+---------------+---------+-----------+----------+--------------+ FV DistalFull           Yes      Yes                                 +---------+---------------+---------+-----------+----------+--------------+ PFV      Full                                                        +---------+---------------+---------+-----------+----------+--------------+ POP      Full           Yes      Yes                                 +---------+---------------+---------+-----------+----------+--------------+ PTV      Full                                                        +---------+---------------+---------+-----------+----------+--------------+ PERO     Full                                                        +---------+---------------+---------+-----------+----------+--------------+   +----+---------------+---------+-----------+----------+--------------+ LEFTCompressibilityPhasicitySpontaneityPropertiesThrombus Aging +----+---------------+---------+-----------+----------+--------------+ CFV Full           Yes      Yes                                 +----+---------------+---------+-----------+----------+--------------+     Summary: RIGHT: - There is no evidence of deep vein thrombosis in the lower extremity.  - No cystic structure found in the popliteal fossa.  LEFT: - No evidence of common femoral vein obstruction.   *See table(s) above for measurements and observations. Electronically signed by Gaile New MD on 02/09/2024 at 8:16:22 AM.    Final    ECHOCARDIOGRAM COMPLETE Result Date: 02/07/2024    ECHOCARDIOGRAM REPORT   Patient Name:   Luke [name] Date of Exam: 02/07/2024 Medical Rec #:  [id number]        Height:       73.0 in Accession #:    [id number]       Weight:        181.0 lb  Date of Birth:  1945/08/24         BSA:          2.062 m Patient Age:    78 years         BP:           141/78 mmHg Patient Gender: M                HR:           103 bpm. Exam Location:  Inpatient Procedure: 2D Echo, Color Doppler and Cardiac Doppler (Both Spectral and Color            Flow Doppler were utilized during procedure). Indications:    Sepsis  History:        Patient has prior history of Echocardiogram examinations, most                 recent 10/30/2022. Cardiomyopathy, CAD, PAD and Abdominal Aorta                 Aneurysm, Arrythmias:LBBB; Risk Factors:Hypertension, Diabetes                 and Dyslipidemia. Chronic Kidney Disease.  Sonographer:    Logan Shove RDCS Referring Phys: [phone] EKTA V PATEL IMPRESSIONS  1. Left ventricular ejection fraction, by estimation, is 50 to 55%. The left ventricle has low normal function. Left ventricular endocardial border not optimally defined to evaluate regional wall motion. Left ventricular diastolic function could not be evaluated. There is the interventricular septum is flattened in systole and diastole, consistent with right ventricular pressure and volume overload.  2. Right ventricular systolic function is moderately reduced. The right ventricular size is moderately enlarged. Tricuspid regurgitation signal is inadequate for assessing PA pressure.  3. Right atrial size was mildly dilated.  4. The mitral valve is normal in structure. Trivial mitral valve regurgitation. No evidence of mitral stenosis.  5. Tricuspid valve regurgitation is moderate.  6. The aortic valve has an indeterminant number of cusps. Aortic valve regurgitation is not visualized. No aortic stenosis is present.  7. The inferior vena cava is normal in size with <50% respiratory variability, suggesting right atrial pressure of 8 mmHg. FINDINGS  Left Ventricle: Left ventricular ejection fraction, by estimation, is 50 to 55%. The left ventricle has low normal function. Left ventricular  endocardial border not optimally defined to evaluate regional wall motion. Strain was performed and the global longitudinal strain is indeterminate. The left ventricular internal cavity size was normal in size. There is no left ventricular hypertrophy. The interventricular septum is flattened in systole and diastole, consistent with right ventricular pressure and  volume overload. Left ventricular diastolic function could not be evaluated due to paced rhythm. Left ventricular diastolic function could not be evaluated. Right Ventricle: The right ventricular size is moderately enlarged. No increase in right ventricular wall thickness. Right ventricular systolic function is moderately reduced. Tricuspid regurgitation signal is inadequate for assessing PA pressure. Left Atrium: Left atrial size was normal in size. Right Atrium: Right atrial size was mildly dilated. Pericardium: There is no evidence of pericardial effusion. Mitral Valve: The mitral valve is normal in structure. Trivial mitral valve regurgitation. No evidence of mitral valve stenosis. Tricuspid Valve: The tricuspid valve is normal in structure. Tricuspid valve regurgitation is moderate . No evidence of tricuspid stenosis. Aortic Valve: The aortic valve has an indeterminant number of cusps. Aortic valve regurgitation is not visualized. No aortic stenosis is present. Aortic valve peak gradient measures  8.5 mmHg. Pulmonic Valve: The pulmonic valve was grossly normal. Pulmonic valve regurgitation is mild. No evidence of pulmonic stenosis. Aorta: The aortic root is normal in size and structure. Venous: The inferior vena cava is normal in size with less than 50% respiratory variability, suggesting right atrial pressure of 8 mmHg. IAS/Shunts: No atrial level shunt detected by color flow Doppler. Additional Comments: 3D was performed not requiring image post processing on an independent workstation and was indeterminate. A device lead is visualized.  LEFT  VENTRICLE PLAX 2D LVIDd:         4.70 cm      Diastology LVIDs:         3.80 cm      LV e' medial:    16.10 cm/s LV PW:         1.00 cm      LV E/e' medial:  7.1 LV IVS:        1.00 cm      LV e' lateral:   20.70 cm/s LVOT diam:     2.20 cm      LV E/e' lateral: 5.5 LVOT Area:     3.80 cm  LV Volumes (MOD) LV vol d, MOD A2C: 140.0 ml LV vol d, MOD A4C: 153.0 ml LV vol s, MOD A2C: 74.7 ml LV vol s, MOD A4C: 74.6 ml LV SV MOD A2C:     65.3 ml LV SV MOD A4C:     153.0 ml LV SV MOD BP:      73.9 ml RIGHT VENTRICLE            IVC RV Basal diam:  4.40 cm    IVC diam: 1.70 cm RV Mid diam:    3.30 cm RV S prime:     9.61 cm/s TAPSE (M-mode): 1.4 cm LEFT ATRIUM             Index        RIGHT ATRIUM           Index LA diam:        3.30 cm 1.60 cm/m   RA Area:     23.30 cm LA Vol (A2C):   42.8 ml 20.75 ml/m  RA Volume:   73.30 ml  35.54 ml/m LA Vol (A4C):   21.8 ml 10.57 ml/m LA Biplane Vol: 30.0 ml 14.55 ml/m  AORTIC VALVE AV Area (Vmax): 2.08 cm AV Vmax:        146.00 cm/s AV Peak Grad:   8.5 mmHg LVOT Vmax:      79.70 cm/s  AORTA Ao Root diam: 3.20 cm Ao Asc diam:  3.30 cm MITRAL VALVE                TRICUSPID VALVE MV Area (PHT): 5.06 cm     TV Peak grad:   49.0 mmHg MV Decel Time: 150 msec     TV Vmax:        3.50 m/s MV E velocity: 114.00 cm/s MV A velocity: 68.20 cm/s   SHUNTS MV E/A ratio:  1.67         Systemic Diam: 2.20 cm Vishnu Priya Mallipeddi Electronically signed by Diannah Late Mallipeddi Signature Date/Time: 02/07/2024/2:17:49 PM    Final    DG Chest Portable 1 View Result Date: 02/06/2024 CLINICAL DATA:  Hypoxia. EXAM: PORTABLE CHEST 1 VIEW COMPARISON:  10/29/2022, chest CT 01/20/2023 FINDINGS: Left-sided pacemaker unchanged. Lungs are hypoinflated demonstrate coarse interstitial changes over the mid to lower lungs with possible  mild mild slight progression compatible with known pulmonary fibrosis. No definite new airspace process or effusion. Cardiomediastinal silhouette and remainder of the exam  is unchanged. IMPRESSION: Hypoinflation with coarse interstitial changes over the mid to lower lungs with possible mild progression compatible with known pulmonary fibrosis. Electronically Signed   By: Toribio Agreste M.D.   On: 02/06/2024 16:43   CT ANGIO LOWER EXT BILAT W &/OR WO CONTRAST Result Date: 02/06/2024 CLINICAL DATA:  Right foot wound, claudication EXAM: CT ANGIOGRAPHY OF ABDOMINAL AORTA WITH ILIOFEMORAL RUNOFF TECHNIQUE: Multidetector CT imaging of the abdomen, pelvis and lower extremities was performed using the standard protocol during bolus administration of intravenous contrast. Multiplanar CT image reconstructions and MIPs were obtained to evaluate the vascular anatomy. RADIATION DOSE REDUCTION: This exam was performed according to the departmental dose-optimization program which includes automated exposure control, adjustment of the mA and/or kV according to patient size and/or use of iterative reconstruction technique. CONTRAST:  OMNIPAQUE  IOHEXOL  350 MG/ML SOLN COMPARISON:  CT scan the abdomen pelvis 08/21/2021 FINDINGS: VASCULAR Aorta: Partially imaged endovascular aortic graft for both the right and left lobes are visible but the body of the graft is not. Treated aneurysm of the infrarenal abdominal aorta. RIGHT Lower Extremity Inflow: Widely patent. Outflow: Mild aneurysmal dilation of the right common femoral artery likely at the site of prior endarterectomy. The vessel measures up to 1.4 cm in diameter. Profunda femoral artery is widely patent. Critical stenosis of the proximal superficial femoral artery due to either peripheral wall adherent mural thrombus or extensive fibrofatty atherosclerotic plaque. Second tandem high-grade stenosis also present in proximal thigh. Heavily calcified atherosclerotic plaque in the mid thigh results complete occlusion of the SFA. The SFA remains occluded through Hunter's canal before reconstituting at the P1 segment of the popliteal artery.  Extensive multifocal calcified plaque results in moderate stenosis of the P1 segment. The popliteal artery then occludes completely at the P2 segment and remains occluded through the P3 segment. Runoff: Reconstitution of the runoff vessels at the origin of the anterior tibial artery. Patent 3 vessel runoff to the ankle. LEFT Lower Extremity Inflow: Common, internal and external iliac arteries are patent without evidence of aneurysm, dissection, vasculitis or significant stenosis. Outflow: Scattered calcified plaque along the common femoral artery with mild luminal narrowing. The profunda femoral artery is widely patent. Bulky calcified plaque results in critical stenosis of the origin of the superficial femoral artery. High-grade stenosis also present within the SFA in the distal thigh just proximal to Hunter's canal. Focal high-grade stenosis of the proximal P3 segment of the popliteal artery. The artery remains patent. Runoff: Likely segmental occlusion of the anterior tibial artery proximal 2 vessel runoff to the ankle. Veins: No focal venous abnormality. Review of the MIP images confirms the above findings. NON-VASCULAR Adrenals/Urinary Tract: Visualized kidneys, ureter and bladder are all within normal limits. Stomach/Bowel: No focal bowel wall thickening or evidence of obstruction. Normal appendix. Lymphatic: No suspicious lymphadenopathy. Reproductive: Mild prostatomegaly. Other: Fat containing right inguinal hernia.  No ascites. Musculoskeletal: Multilevel degenerative disc disease. No acute fracture or malalignment. No lytic or blastic osseous lesion. IMPRESSION: VASCULAR 1. Severe right worse than left femoropopliteal disease as detailed above. 2. Partially imaged endovascular aortic repair with a bifurcated endovascular prosthesis. The iliac limbs are widely patent and there is no significant inflow disease. 3. Patent 3 vessel runoff to the ankle right. 4. Short segment occlusion of the left anterior  tibial artery with patent 2 vessel runoff to the ankle. 5.  Mild ectasias a of the right common femoral artery likely at the site of prior endarterectomy. Maximal vessel diameter 1.4 cm. NON-VASCULAR 1. Fat containing right inguinal hernia. 2. Multilevel degenerative disc disease. Electronically Signed   By: Wilkie Lent M.D.   On: 02/06/2024 16:11   CT FOOT RIGHT WO CONTRAST Result Date: 02/05/2024 EXAM: CT RIGHT FOOT AND ANKLE, WITHOUT IV CONTRAST 02/05/2024 04:15:00 PM TECHNIQUE: Axial images were acquired through the right foot and ankle without IV contrast. Reformatted images were reviewed. Automated exposure control, iterative reconstruction, and/or weight based adjustment of the mA/kV was utilized to reduce the radiation dose to as low as reasonably achievable. COMPARISON: None available. CLINICAL HISTORY: Right foot swelling, suspected osteomyelitis, history of diabetes and PAD, draining wounds, subacute osteomyelitis on prior X-ray. FINDINGS: BONES AND JOINTS: Destructive findings in the head of the 1st metatarsal, base of the proximal phalanx great toe, phalanges of the third toe, phalanges of the fourth toe, and in the head of the fourth metatarsal compatible with active osteomyelitis. Oblique fracture through the distal metaphysis of the 4th metatarsal. Small well corticated ossicles below the medial malleolus. Large plantar and achilles calcaneal spurs. Posterior spur angle in the posterior subtalar joint. Mild dorsal midfoot spurring. Mild scallop angle in the superior margins of the 1st digit sesamoids possibly from sesamoiditis or osteomyelitis. No dislocation. The joint spaces are normal. SOFT TISSUES: Circumferential subcutaneous edema in the ankle. Dorsal subcutaneous edema in the forefoot. IMPRESSION: 1. Active osteomyelitis involving the head of the 1st metatarsal, base of the proximal phalanx of the great toe, phalanges of the third and fourth toes, and head of the fourth metatarsal. 2.  Oblique fracture through the distal metaphysis of the 4th metatarsal. 3. Possible sesamoiditis or osteomyelitis of the 1st digit sesamoids. 4. Circumferential ankle and dorsal forefoot subcutaneous edema. 5. Large plantar and Achilles calcaneal spurs, posterior subtalar spur, and mild dorsal midfoot spurring. 6. Small well-corticated ossicles below the medial malleolus. Electronically signed by: Ryan Salvage MD 02/05/2024 04:30 PM EDT RP Workstation: [id number]   DG Foot Complete Right Result Date: 02/04/2024 Please see detailed radiograph report in office note.   Microbiology: Results for orders placed or performed during the hospital encounter of 02/06/24  Blood Cultures x 2 sites     Status: None   Collection Time: 02/06/24 11:20 AM   Specimen: BLOOD  Result Value Ref Range Status   Specimen Description BLOOD SITE NOT SPECIFIED  Final   Special Requests   Final    BOTTLES DRAWN AEROBIC AND ANAEROBIC Blood Culture results may not be optimal due to an inadequate volume of blood received in culture bottles   Culture   Final    NO GROWTH 5 DAYS Performed at [facility], [address] [place], [place] [address]    Report Status 02/11/2024 FINAL  Final  Blood Cultures x 2 sites     Status: None   Collection Time: 02/06/24 11:20 AM   Specimen: BLOOD  Result Value Ref Range Status   Specimen Description BLOOD SITE NOT SPECIFIED  Final   Special Requests   Final    BOTTLES DRAWN AEROBIC AND ANAEROBIC Blood Culture results may not be optimal due to an inadequate volume of blood received in culture bottles   Culture   Final    NO GROWTH 5 DAYS Performed at [facility], [address] [place], [place] [address]    Report Status 02/11/2024 FINAL  Final   *Note: Due to  a large number of results and/or encounters for the requested time period, some results have not been displayed. A complete set of results can be found in Results Review.    Labs: CBC: Recent Labs  Lab  02/23/24 1607 02/28/24 2218 02/29/24 0234  WBC 11.8* 12.6* 10.7*  NEUTROABS 9.4*  --   --   HGB 12.6* 11.7* 11.8*  HCT 37.6* 36.2* 34.9*  MCV 98.1 102.8* 99.4  PLT 293.0 234 211   Basic Metabolic Panel: Recent Labs  Lab 02/23/24 1607 02/26/24 1314 02/28/24 2218 02/29/24 0234  NA 133* 135 134* 136  K 5.3 No hemolysis seen* 5.1 4.4 4.7  CL 99 102 101 103  CO2 23 23 16* 18*  GLUCOSE 181* 190* 297* 224*  BUN 31* 31* 31* 32*  CREATININE 1.47 1.65* 1.89* 1.66*  CALCIUM  10.7* 10.0 9.3 9.4   Liver Function Tests: Recent Labs  Lab 02/23/24 1607 02/28/24 2218  AST 18 172*  ALT 17 101*  ALKPHOS 63 87  BILITOT 0.5 0.6  PROT 8.3 7.5  ALBUMIN  4.3 3.0*   CBG: Recent Labs  Lab 02/28/24 2216 02/29/24 0730 02/29/24 1139  GLUCAP 264* 151* 216*    Discharge time spent: greater than 30 minutes.  Signed: Romonia Yanik, DO [facility] 02/29/2024

## 2024-02-29 NOTE — Assessment & Plan Note (Signed)
 Noted. This is the cause for the patient's hypoxia. He has recently been started on Tyvano. He states that he thinks that he has not been feeling as well since he was started on this. He will discuss with pulmonology.

## 2024-02-29 NOTE — Assessment & Plan Note (Signed)
 Interrogated. No arrhythmia detected.

## 2024-02-29 NOTE — H&P (Addendum)
 History and Physical    Luke [name] FMW:[id number] DOB: [birth year] DOA: 02/28/2024  PCP: Cleatus Arlyss RAMAN, MD   Chief Complaint:  sob  HPI: Luke [name] is a 78 y.o. male with medical history significant of pulm fibrosis, A-fib, CAD who presents emergency department after hypoxic event.  Patient was at the function of the hotel and was walking to his car to get his oxygen  as he was feeling short of breath.  He states he typically wears oxygen  as needed and at night.  He collapsed while he was walking to his car and found to be profoundly hypoxic.  Bystanders said that he was blue and started CPR.  EMS was called he was transported to the ER for further assessment.  On arrival he was 45 Brown hemodynamically stable.  Labs were obtained on presentation which showed glucose 297, creatinine 1.89 baseline around 1.5, AST 172, ALT 801, WBC 12.6, hemoglobin 11.7, troponin 320, 387.  Patient denied any chest pain.  Chest x-ray was obtained which showed no acute abnormalities.  Of note patient was recently hospitalized for osteomyelitis.  During this presentation he was discharged with daptomycin  and cefepime .   Review of Systems: Review of Systems  Constitutional: Negative.   HENT: Negative.    Eyes: Negative.   Respiratory: Negative.    Cardiovascular: Negative.   Gastrointestinal: Negative.   Genitourinary: Negative.   Musculoskeletal: Negative.   Skin: Negative.   Neurological: Negative.   Endo/Heme/Allergies: Negative.   Psychiatric/Behavioral: Negative.       As per HPI otherwise 10 point review of systems negative.   Allergies  Allergen Reactions   Aldactone  [Spironolactone ] Other (See Comments)    Hyperkalemia   Brilinta  [Ticagrelor ] Shortness Of Breath    Numbness in hands and feet Blurry vision   Clopidogrel  Rash    Redness and Itchiness   Codeine Rash and Hives   Amoxicillin  Hives   Atorvastatin  Other (See Comments)    Myalgias with lipitor.  Does tolerate  simvastatin .     Benadryl  [Diphenhydramine ] Hives   Januvia  [Sitagliptin ] Other (See Comments)    Diarrhea and heart racing   Jardiance  [Empagliflozin ] Other (See Comments)    Polyuria; excessive weight loss   Lisinopril  Other (See Comments)    Possible cause of pancreatitis   Rosuvastatin  Other (See Comments)    myalgia   Lasix  [Furosemide ] Rash    Past Medical History:  Diagnosis Date   AAA (abdominal aortic aneurysm) 2011   Per vascular surgery   Angio-edema    Atrial fibrillation (HCC)    CAD (coronary artery disease)    Presumed CAD with nuclear scan October 09, 2011,  large anteroseptal MI and inferior MI. Catheterization scheduled October 15, 2011   Cardiomyopathy [facility])    Nuclear, October 09, 2011, EF 30%, multiple focal wall motion abnormalities   Chronic kidney disease    CKD3   COVID-19    2021   Diabetes mellitus    type II   GERD (gastroesophageal reflux disease)    Silent   HLD (hyperlipidemia)    Hypertension    white coat HTN-- often elevated in office and controlled on outside checks.   ICD (implantable cardioverter-defibrillator) in place    CRT-D placed March, 2014 complete heart block and k dysfunction   IPF (idiopathic pulmonary fibrosis) (HCC)    LBBB (left bundle branch block)    LBBB on EKG October 11, 2011,  no prior EKG has been done   Low testosterone  Hx of   On home oxygen  therapy    2L as needed   Pacemaker    PAD (peripheral artery disease)    Pancreatitis    Peripheral arterial disease    Umbilical hernia     Past Surgical History:  Procedure Laterality Date   ABDOMINAL AORTAGRAM N/A 10/28/2011   Procedure: ABDOMINAL EZELLA;  Surgeon: Lonni GORMAN Blade, MD;  Location: [facility] CATH LAB;  Service: Cardiovascular;  Laterality: N/A;   ABDOMINAL AORTIC ANEURYSM REPAIR     EVAR    ABDOMINAL AORTOGRAM N/A 05/27/2022   Procedure: ABDOMINAL AORTOGRAM;  Surgeon: Sheree Penne Lonni, MD;  Location: [facility] INVASIVE CV LAB;  Service: Cardiovascular;   Laterality: N/A;   ABDOMINAL AORTOGRAM N/A 02/09/2024   Procedure: ABDOMINAL AORTOGRAM;  Surgeon: Pearline Norman GORMAN, MD;  Location: [facility] INVASIVE CV LAB;  Service: Cardiovascular;  Laterality: N/A;   BI-VENTRICULAR IMPLANTABLE CARDIOVERTER DEFIBRILLATOR N/A 07/16/2012   Procedure: BI-VENTRICULAR IMPLANTABLE CARDIOVERTER DEFIBRILLATOR  (CRT-D);  Surgeon: Elspeth JAYSON Sage, MD;  Location: [facility] CATH LAB;  Service: Cardiovascular;  Laterality: N/A;   BIV ICD GENERATOR CHANGEOUT N/A 10/25/2020   Procedure: BIV ICD GENERATOR CHANGEOUT;  Surgeon: Sage Elspeth JAYSON, MD;  Location: [facility] INVASIVE CV LAB;  Service: Cardiovascular;  Laterality: N/A;   CARDIAC CATHETERIZATION     CATARACT EXTRACTION Left 2023   COLONOSCOPY  03/23/2018; 2020   CORONARY CTO INTERVENTION N/A 10/10/2022   Procedure: CORONARY CTO INTERVENTION;  Surgeon: Dann Candyce GORMAN, MD;  Location: [facility] INVASIVE CV LAB;  Service: Cardiovascular;  Laterality: N/A;   CORONARY LITHOTRIPSY N/A 09/18/2022   Procedure: CORONARY LITHOTRIPSY;  Surgeon: Dann Candyce GORMAN, MD;  Location: [facility] INVASIVE CV LAB;  Service: Cardiovascular;  Laterality: N/A;   CORONARY STENT INTERVENTION N/A 09/18/2022   Procedure: CORONARY STENT INTERVENTION;  Surgeon: Dann Candyce GORMAN, MD;  Location: [facility] INVASIVE CV LAB;  Service: Cardiovascular;  Laterality: N/A;   CORONARY ULTRASOUND/IVUS N/A 09/18/2022   Procedure: Coronary Ultrasound/IVUS;  Surgeon: Dann Candyce GORMAN, MD;  Location: [facility] INVASIVE CV LAB;  Service: Cardiovascular;  Laterality: N/A;   ENDARTERECTOMY FEMORAL Right 11/19/2022   Procedure: RIGHT COMMON FEMORAL ENDARTERECTOMY WITH VEIN PATCH ANGIOPLASTY;  Surgeon: Sheree Penne Lonni, MD;  Location: [facility] OR;  Service: Vascular;  Laterality: Right;   ESOPHAGOGASTRODUODENOSCOPY N/A 02/12/2019   Procedure: ESOPHAGOGASTRODUODENOSCOPY (EGD);  Surgeon: Charlanne Groom, MD;  Location: [facility] ENDOSCOPY;  Service: Endoscopy;  Laterality: N/A;   ESOPHAGOGASTRODUODENOSCOPY (EGD)  WITH PROPOFOL  N/A 02/05/2018   Procedure: ESOPHAGOGASTRODUODENOSCOPY (EGD) WITH PROPOFOL ;  Surgeon: Shila Gustav GAILS, MD;  Location: [facility] ENDOSCOPY;  Service: Endoscopy;  Laterality: N/A;   ESOPHAGOGASTRODUODENOSCOPY (EGD) WITH PROPOFOL  N/A 09/27/2018   Procedure: ESOPHAGOGASTRODUODENOSCOPY (EGD) WITH PROPOFOL ;  Surgeon: Rollin Dover, MD;  Location: [facility] ENDOSCOPY;  Service: Endoscopy;  Laterality: N/A;   FOREIGN BODY REMOVAL  09/27/2018   Procedure: FOREIGN BODY REMOVAL;  Surgeon: Rollin Dover, MD;  Location: [facility] ENDOSCOPY;  Service: Endoscopy;;   FOREIGN BODY REMOVAL  02/12/2019   Procedure: FOREIGN BODY REMOVAL;  Surgeon: Charlanne Groom, MD;  Location: [facility] ENDOSCOPY;  Service: Endoscopy;;   HERNIA REPAIR  05/14/2013   INSERTION OF MESH N/A 05/14/2013   Procedure: INSERTION OF MESH;  Surgeon: Elspeth KYM Schultze, MD;  Location: [facility] OR;  Service: General;  Laterality: N/A;   LEFT HEART CATH N/A 10/10/2022   Procedure: Left Heart Cath;  Surgeon: Dann Candyce GORMAN, MD;  Location: [facility] INVASIVE CV LAB;  Service: Cardiovascular;  Laterality: N/A;   LEFT HEART CATH AND CORONARY ANGIOGRAPHY N/A 07/17/2021  Procedure: LEFT HEART CATH AND CORONARY ANGIOGRAPHY;  Surgeon: Burnard Debby LABOR, MD;  Location: [facility] INVASIVE CV LAB;  Service: Cardiovascular;  Laterality: N/A;   LEFT HEART CATH AND CORONARY ANGIOGRAPHY N/A 09/18/2022   Procedure: LEFT HEART CATH AND CORONARY ANGIOGRAPHY;  Surgeon: Dann Candyce RAMAN, MD;  Location: [facility] INVASIVE CV LAB;  Service: Cardiovascular;  Laterality: N/A;   LOWER EXTREMITY ANGIOGRAM Bilateral 10/28/2011   Procedure: LOWER EXTREMITY ANGIOGRAM;  Surgeon: Lonni RAMAN Blade, MD;  Location: [facility] CATH LAB;  Service: Cardiovascular;  Laterality: Bilateral;   LOWER EXTREMITY ANGIOGRAM Right 11/19/2022   Procedure: RIGHT LOWER EXTREMITY ANGIOGRAM;  Surgeon: Sheree Penne Lonni, MD;  Location: [facility] OR;  Service: Vascular;  Laterality: Right;   LOWER EXTREMITY ANGIOGRAPHY  05/27/2022    Procedure: Lower Extremity Angiography;  Surgeon: Sheree Penne Lonni, MD;  Location: [facility] INVASIVE CV LAB;  Service: Cardiovascular;;   LOWER EXTREMITY ANGIOGRAPHY Right 02/09/2024   Procedure: Lower Extremity Angiography;  Surgeon: Pearline Norman RAMAN, MD;  Location: [facility] INVASIVE CV LAB;  Service: Cardiovascular;  Laterality: Right;   PACEMAKER INSERTION  07/16/2012   pacemaker/defibrilator   PATCH ANGIOPLASTY Right 11/19/2022   Procedure: VEIN PATCH ANGIOPLASTY;  Surgeon: Sheree Penne Lonni, MD;  Location: [facility] OR;  Service: Vascular;  Laterality: Right;   POLYPECTOMY     POSTERIOR CERVICAL FUSION/FORAMINOTOMY N/A 06/09/2014   Procedure: Laminectomy - Cervical two-Cervcial four posterior cervical instrumented fusion Cervical two-cervical four;  Surgeon: Alm RAMAN Molt, MD;  Location: [facility] NEURO ORS;  Service: Neurosurgery;  Laterality: N/A;  posterior    RIGHT/LEFT HEART CATH AND CORONARY ANGIOGRAPHY N/A 10/10/2023   Procedure: RIGHT/LEFT HEART CATH AND CORONARY ANGIOGRAPHY;  Surgeon: Elmira Newman PARAS, MD;  Location: [facility] INVASIVE CV LAB;  Service: Cardiovascular;  Laterality: N/A;   SAVORY DILATION N/A 02/05/2018   Procedure: SAVORY DILATION;  Surgeon: Shila Gustav GAILS, MD;  Location: [facility] ENDOSCOPY;  Service: Endoscopy;  Laterality: N/A;   TONSILLECTOMY     as a child    UMBILICAL HERNIA REPAIR N/A 05/14/2013   Procedure: LAPAROSCOPIC exploration and repair of hernia in abdominal ;  Surgeon: Elspeth KYM Schultze, MD;  Location: [facility] OR;  Service: General;  Laterality: N/A;   UPPER GASTROINTESTINAL ENDOSCOPY  2020     reports that he quit smoking about 19 years ago. His smoking use included cigarettes. He started smoking about 61 years ago. He has a 84 pack-year smoking history. He has never been exposed to tobacco smoke. He has never used smokeless tobacco. He reports that he does not currently use alcohol. He reports that he does not use drugs.  Family History  Problem Relation Age of Onset    Hypertension Mother    Stroke Mother    Hyperlipidemia Mother    Lung cancer Father    Diabetes Sister    Heart disease Sister        Before age 13   Hypertension Sister    Hyperlipidemia Sister    Heart attack Sister    Hypertension Son    Colon cancer Neg Hx    Prostate cancer Neg Hx    Esophageal cancer Neg Hx    Stomach cancer Neg Hx    Rectal cancer Neg Hx    Colon polyps Neg Hx    Pancreatic cancer Neg Hx     Prior to Admission medications   Medication Sig Start Date End Date Taking? Authorizing Provider  carvedilol  (COREG ) 25 MG tablet TAKE 1 TABLET BY MOUTH TWICE DAILY WITH MEALS  02/19/24  Yes Jeffrie Oneil BROCKS, MD  ceFEPime  (MAXIPIME ) IVPB Inject 2 g into the vein every 8 (eight) hours. Indication:  R-foot osteo First Dose: Yes Last Day of Therapy:  03/19/24 Labs - Once weekly:  CBC/D and BMP, Labs - Once weekly: ESR and CRP Method of administration: IV Push Method of administration may be changed at the discretion of home infusion pharmacist based upon assessment of the patient and/or caregiver's ability to self-administer the medication ordered. 02/10/24 03/19/24 Yes Dahal, Chapman, MD  Cholecalciferol  (VITAMIN D3) 50 MCG (2000 UT) TABS Take 2,000 Units by mouth in the morning.   Yes [provider]  Cyanocobalamin  (VITAMIN B-12 PO) Take 2,000 mcg by mouth in the morning.   Yes [provider]  daptomycin  (CUBICIN ) IVPB Inject 700 mg into the vein daily. Indication:  R-foot osteo First Dose: Yes Last Day of Therapy:  03/19/24 Labs - Once weekly:  CBC/D, BMP, and CPK Labs - Once weekly: ESR and CRP Method of administration: IV Push Method of administration may be changed at the discretion of home infusion pharmacist based upon assessment of the patient and/or caregiver's ability to self-administer the medication ordered. 02/10/24 03/19/24 Yes Dahal, Chapman, MD  glipiZIDE  (GLUCOTROL ) 5 MG tablet TAKE 1 TABLET(5 MG) BY MOUTH TWICE DAILY BEFORE A MEAL  02/17/24  Yes Cleatus Arlyss RAMAN, MD  insulin  glargine (LANTUS  SOLOSTAR) 100 UNIT/ML Solostar Pen INJECT 0.3 TO 0.35 MLS(30 TO 35 UNITS) INTO THE SKIN EVERY DAY 03/29/20  Yes Cleatus Arlyss RAMAN, MD  metFORMIN  (GLUCOPHAGE ) 500 MG tablet TAKE 2 TABLETS BY MOUTH TWICE DAILY WITH FOOD 04/29/23  Yes Cleatus Arlyss RAMAN, MD  Multiple Vitamin (MULTIVITAMIN WITH MINERALS) TABS tablet Take 1 tablet by mouth in the morning.   Yes [provider]  omeprazole  (PRILOSEC) 40 MG capsule TAKE 1 CAPSULE(40 MG) BY MOUTH DAILY 08/14/23  Yes Nandigam, Kavitha V, MD  prasugrel  (EFFIENT ) 10 MG TABS tablet Take 0.5 tablets (5 mg total) by mouth in the morning and at bedtime. 10/06/23  Yes Cleatus Arlyss RAMAN, MD  Treprostinil  48 MCG POWD Inhale 1 puff into the lungs in the morning, at noon, in the evening, and at bedtime.   Yes [provider]  umeclidinium bromide  (INCRUSE ELLIPTA ) 62.5 MCG/ACT AEPB Inhale 1 puff into the lungs daily. 02/23/24  Yes Parrett, Tammy S, NP  valsartan  (DIOVAN ) 40 MG tablet Take 1 tablet (40 mg total) by mouth daily with supper. 10/08/23  Yes Jeffrie Oneil BROCKS, MD  aspirin  EC 81 MG tablet Take 1 tablet (81 mg total) by mouth daily. Swallow whole. Patient not taking: Reported on 02/29/2024 02/11/24   Arlice Chapman, MD  glucose blood (ACCU-CHEK AVIVA PLUS) test strip USE AS DIRECTED TO TEST BLOOD SUGAR TWICE DAILY 04/15/23   Cleatus Arlyss RAMAN, MD  simvastatin  (ZOCOR ) 20 MG tablet TAKE 1 TABLET(20 MG) BY MOUTH AT BEDTIME Patient not taking: Reported on 02/29/2024 10/13/23   Walker, Caitlin S, NP  torsemide  (DEMADEX ) 20 MG tablet Take 1 tablet (20 mg total) by mouth daily as needed (for swelling). Patient not taking: Reported on 02/29/2024 02/16/24   Cleatus Arlyss RAMAN, MD    Physical Exam: Vitals:   02/28/24 2330 02/28/24 2345 02/29/24 0000 02/29/24 0015  BP: 136/74 122/73 138/74 127/75  Pulse: 98 95 96 95  Resp: (!) 21 20 17 19   Temp:      TempSrc:      SpO2: 97% 98% 95% 97%   Physical  Exam Constitutional:  Appearance: He is normal weight.  HENT:     Head: Normocephalic.     Nose: Nose normal.     Mouth/Throat:     Mouth: Mucous membranes are moist.     Pharynx: Oropharynx is clear.  Eyes:     Conjunctiva/sclera: Conjunctivae normal.  Cardiovascular:     Rate and Rhythm: Normal rate and regular rhythm.     Pulses: Normal pulses.     Heart sounds: Normal heart sounds.  Pulmonary:     Effort: Pulmonary effort is normal.     Breath sounds: Normal breath sounds.  Abdominal:     General: Abdomen is flat. Bowel sounds are normal.  Musculoskeletal:        General: Normal range of motion.     Cervical back: Normal range of motion.  Skin:    General: Skin is warm.     Capillary Refill: Capillary refill takes less than 2 seconds.  Neurological:     General: No focal deficit present.     Mental Status: He is alert.  Psychiatric:        Mood and Affect: Mood normal.     Labs on Admission: I have personally reviewed the patients's labs and imaging studies.  Assessment/Plan Principal Problem:   Hypoxia   # Acute hypoxic respiratory failure most likely secondary to pulmonary fibrosis -Noted to have respiratory arrest in setting of hypoxia - Was walking to car for his oxygen  which she had left behind when he developed a syncopal episode and hypoxia - Profoundly hypoxic on evaluation  Plan: Continue with supplemental oxygen  Continue home inhalers  #Weakness, difficulty walking- order PT eval.   # Elevated LFTs-likely related to hypoxic event.  Will check right upper quadrant ultrasound as well as hepatitis panel  # Elevated troponin-likely related to demand in setting of hypoxic event  # Right foot osteomyelitis-patient had recent discharge on 10/7 and was given daptomycin  and cefepime  with an end date 11/14  # PAD status post endarterectomies # Abdominal aortic aneurysm  Plan: Continue Effient , simvastatin   # AKI-patient has baseline creatinine  around 1.2.  Found to be 1.89 on presentation.  Will continue to monitor  # Type 2 diabetes-placed on Lantus  and sliding scale  # Hypertension # History of nonischemic cardiomyopathy - Will place on Coreg , valsartan  and home diuretic was held due to AKI  # Paroxysmal A-fib-continue to monitor   Admission status: Inpatient Telemetry Medical  Certification: The appropriate patient status for this patient is INPATIENT. Inpatient status is judged to be reasonable and necessary in order to provide the required intensity of service to ensure the patient's safety. The patient's presenting symptoms, physical exam findings, and initial radiographic and laboratory data in the context of their chronic comorbidities is felt to place them at high risk for further clinical deterioration. Furthermore, it is not anticipated that the patient will be medically stable for discharge from the hospital within 2 midnights of admission.   * I certify that at the point of admission it is my clinical judgment that the patient will require inpatient hospital care spanning beyond 2 midnights from the point of admission due to high intensity of service, high risk for further deterioration and high frequency of surveillance required.DEWAINE Lamar Dess MD [facility] If 7PM-7AM, please contact night-coverage [email or web address]  02/29/2024, 2:12 AM

## 2024-02-29 NOTE — Care Management CC44 (Signed)
 Condition Code 44 Documentation Completed  Patient Details  Name: Luke [name] MRN: [id number] Date of Birth: 1945/07/10   Condition Code 44 given:  Yes Patient signature on Condition Code 44 notice:  Yes Documentation of 2 MD's agreement:  Yes Code 44 added to claim:  Yes    Corean JAYSON Canary, RN 02/29/2024, 4:57 PM

## 2024-02-29 NOTE — Assessment & Plan Note (Signed)
 Noted

## 2024-02-29 NOTE — Assessment & Plan Note (Signed)
 Patient is to continue cefepime as previously prescribed. He is to keep all appointments with infectious disease.

## 2024-02-29 NOTE — ED Notes (Signed)
 Patient transported to ultrasound.

## 2024-02-29 NOTE — Assessment & Plan Note (Signed)
 Noted. Creatinine is improved since presentation.

## 2024-03-02 ENCOUNTER — Encounter: Payer: Self-pay | Admitting: Internal Medicine

## 2024-03-02 ENCOUNTER — Other Ambulatory Visit: Payer: Self-pay

## 2024-03-02 ENCOUNTER — Ambulatory Visit: Admitting: Internal Medicine

## 2024-03-02 VITALS — BP 154/78 | HR 92 | Wt 176.4 lb

## 2024-03-02 DIAGNOSIS — E11628 Type 2 diabetes mellitus with other skin complications: Secondary | ICD-10-CM

## 2024-03-02 DIAGNOSIS — L089 Local infection of the skin and subcutaneous tissue, unspecified: Secondary | ICD-10-CM | POA: Diagnosis not present

## 2024-03-02 DIAGNOSIS — M869 Osteomyelitis, unspecified: Secondary | ICD-10-CM

## 2024-03-02 DIAGNOSIS — Z23 Encounter for immunization: Secondary | ICD-10-CM | POA: Diagnosis not present

## 2024-03-02 DIAGNOSIS — S92341D Displaced fracture of fourth metatarsal bone, right foot, subsequent encounter for fracture with routine healing: Secondary | ICD-10-CM | POA: Diagnosis not present

## 2024-03-02 DIAGNOSIS — I509 Heart failure, unspecified: Secondary | ICD-10-CM | POA: Diagnosis not present

## 2024-03-02 DIAGNOSIS — M86371 Chronic multifocal osteomyelitis, right ankle and foot: Secondary | ICD-10-CM

## 2024-03-02 DIAGNOSIS — M86671 Other chronic osteomyelitis, right ankle and foot: Secondary | ICD-10-CM | POA: Diagnosis not present

## 2024-03-02 DIAGNOSIS — I739 Peripheral vascular disease, unspecified: Secondary | ICD-10-CM | POA: Diagnosis present

## 2024-03-02 DIAGNOSIS — L97519 Non-pressure chronic ulcer of other part of right foot with unspecified severity: Secondary | ICD-10-CM | POA: Diagnosis not present

## 2024-03-02 DIAGNOSIS — E1169 Type 2 diabetes mellitus with other specified complication: Secondary | ICD-10-CM | POA: Diagnosis not present

## 2024-03-02 DIAGNOSIS — E1151 Type 2 diabetes mellitus with diabetic peripheral angiopathy without gangrene: Secondary | ICD-10-CM | POA: Diagnosis not present

## 2024-03-02 DIAGNOSIS — E11621 Type 2 diabetes mellitus with foot ulcer: Secondary | ICD-10-CM | POA: Diagnosis not present

## 2024-03-02 MED ORDER — DOXYCYCLINE HYCLATE 100 MG PO TABS: 100.0000 mg | ORAL_TABLET | Freq: Two times a day (BID) | ORAL | 0 refills | Status: DC

## 2024-03-02 NOTE — Patient Instructions (Addendum)
 Stop daptomycin; this could cause dyspnea but the recent chest xray doesn't suggest so   Continue cefepime  Start oral doxycycline  100 mg twice a day   All antibiotics finish on 03/19/24    Make sure you check in with your cardiologist regarding breathing issue  Follow up 5-6 weeks with me    -------- Labs to be drawn weekly from home health Cbc, cmp, cpk, crp, and sed rate do this until 03/19/24

## 2024-03-02 NOTE — Addendum Note (Signed)
 Addended by: TRUDY WARREN CROME on: 03/02/2024 04:45 PM   Modules accepted: Orders

## 2024-03-02 NOTE — Progress Notes (Signed)
 [facility]  Patient Active Problem List   Diagnosis Date Noted   Hypoxia 02/29/2024   Syncope and collapse 02/29/2024   Osteomyelitis (HCC) 02/07/2024   Chronic multifocal osteomyelitis of right foot (HCC) 02/06/2024   Acute osteomyelitis of right foot (HCC) 02/06/2024   PAD (peripheral artery disease) 11/19/2022   Acute on chronic systolic (congestive) heart failure (HCC) 10/29/2022   Pressure injury of skin 10/29/2022   Cellulitis 10/20/2022   Angina pectoris 10/10/2022   CAD (coronary artery disease) 09/18/2022   CKD (chronic kidney disease) stage 3, GFR 30-59 ml/min (HCC) 09/18/2022   AKI (acute kidney injury) 08/21/2021   Enteritis 08/21/2021   IPF (idiopathic pulmonary fibrosis) (HCC) 03/21/2021   Pain due to onychomycosis of toenails of both feet 08/10/2019   Chronic HFrEF (heart failure with reduced ejection fraction) (HCC) 06/08/2019   Biventricular ICD (implantable cardioverter-defibrillator) in place 06/08/2019   Esophageal obstruction due to food impaction    Esophageal stricture    Dysphagia 11/19/2017   Pancreatitis 11/10/2017   Tinnitus 05/23/2015   Advance care planning 10/20/2014   S/P cervical spinal fusion 06/09/2014   Stenosis of cervical spine with myelopathy (HCC) 12/27/2013   Central cord syndrome (HCC) 06/24/2013   Status post abdominal aortic aneurysm repair 12/30/2012   HLD (hyperlipidemia) 09/16/2012   SK (seborrheic keratosis) 02/12/2012   Ischemic cardiomyopathy    LBBB (left bundle branch block)    AAA (abdominal aortic aneurysm) 10/08/2010   PVD (peripheral vascular disease) 10/08/2010   ORGANIC IMPOTENCE 07/05/2010   Essential hypertension, benign 02/27/2010   Type 2 diabetes mellitus with hyperlipidemia (HCC) 11/16/2009      Subjective:    Patient ID: Luke [name], male    DOB: [birth year], 78 y.o.   MRN: [id number]  No chief complaint on file.   HPI:  Luke [name] is a 78 y.o. male  here for f/u right diabetic/pad foot infection  03/02/24 id clinic f/u See a&p for detail   Allergies  Allergen Reactions   Aldactone  [Spironolactone ] Other (See Comments)    Hyperkalemia   Brilinta  [Ticagrelor ] Shortness Of Breath    Numbness in hands and feet Blurry vision   Clopidogrel  Rash    Redness and Itchiness   Codeine Rash and Hives   Amoxicillin  Hives   Atorvastatin  Other (See Comments)    Myalgias with lipitor.  Does tolerate simvastatin .     Benadryl  [Diphenhydramine ] Hives   Januvia  [Sitagliptin ] Other (See Comments)    Diarrhea and heart racing   Jardiance  [Empagliflozin ] Other (See Comments)    Polyuria; excessive weight loss   Lisinopril  Other (See Comments)    Possible cause of pancreatitis   Rosuvastatin  Other (See Comments)    myalgia   Lasix  [Furosemide ] Rash      Outpatient Medications Prior to Visit  Medication Sig Dispense Refill   carvedilol  (COREG ) 25 MG tablet TAKE 1 TABLET BY MOUTH TWICE DAILY WITH MEALS 180 tablet 3   ceFEPime (MAXIPIME) IVPB Inject 2 g into the vein every 8 (eight) hours. Indication:  R-foot osteo First Dose: Yes Last Day of Therapy:  03/19/24 Labs - Once weekly:  CBC/D and BMP, Labs - Once weekly: ESR and CRP Method of administration: IV Push Method of administration may be changed at the discretion of home infusion pharmacist based upon assessment of the patient and/or caregiver's ability to self-administer the medication ordered. 114 Units 0   Cholecalciferol  (VITAMIN D3) 50 MCG (2000 UT)  TABS Take 2,000 Units by mouth in the morning.     Cyanocobalamin  (VITAMIN B-12 PO) Take 2,000 mcg by mouth in the morning.     daptomycin (CUBICIN) IVPB Inject 700 mg into the vein daily. Indication:  R-foot osteo First Dose: Yes Last Day of Therapy:  03/19/24 Labs - Once weekly:  CBC/D, BMP, and CPK Labs - Once weekly: ESR and CRP Method of administration: IV Push Method of administration may be changed at the discretion of home  infusion pharmacist based upon assessment of the patient and/or caregiver's ability to self-administer the medication ordered. 114 Units 0   glipiZIDE  (GLUCOTROL ) 5 MG tablet TAKE 1 TABLET(5 MG) BY MOUTH TWICE DAILY BEFORE A MEAL 180 tablet 0   glucose blood (ACCU-CHEK AVIVA PLUS) test strip USE AS DIRECTED TO TEST BLOOD SUGAR TWICE DAILY 200 strip 3   insulin  glargine (LANTUS  SOLOSTAR) 100 UNIT/ML Solostar Pen INJECT 0.3 TO 0.35 MLS(30 TO 35 UNITS) INTO THE SKIN EVERY DAY 15 mL 3   irbesartan  (AVAPRO ) 75 MG tablet Take 1 tablet (75 mg total) by mouth daily. 30 tablet 0   metFORMIN  (GLUCOPHAGE ) 500 MG tablet TAKE 2 TABLETS BY MOUTH TWICE DAILY WITH FOOD 360 tablet 3   Multiple Vitamin (MULTIVITAMIN WITH MINERALS) TABS tablet Take 1 tablet by mouth in the morning.     omeprazole  (PRILOSEC) 40 MG capsule TAKE 1 CAPSULE(40 MG) BY MOUTH DAILY 90 capsule 3   prasugrel  (EFFIENT ) 10 MG TABS tablet Take 0.5 tablets (5 mg total) by mouth in the morning and at bedtime.     Treprostinil 48 MCG POWD Inhale 1 puff into the lungs in the morning, at noon, in the evening, and at bedtime.     umeclidinium bromide  (INCRUSE ELLIPTA ) 62.5 MCG/ACT AEPB Inhale 1 puff into the lungs daily. 1 each 5   simvastatin  (ZOCOR ) 20 MG tablet TAKE 1 TABLET(20 MG) BY MOUTH AT BEDTIME (Patient not taking: Reported on 03/02/2024) 90 tablet 3   No facility-administered medications prior to visit.     Social History   Socioeconomic History   Marital status: Married    Spouse name: Willy   Number of children: 1   Years of education: Not on file   Highest education level: Bachelor's degree (e.g., BA, AB, BS)  Occupational History   Occupation: retired  Tobacco Use   Smoking status: Former    Current packs/day: 0.00    Average packs/day: 2.0 packs/day for 42.0 years (84.0 ttl pk-yrs)    Types: Cigarettes    Start date: 1964    Quit date: 05/06/2004    Years since quitting: 19.8    Passive exposure: Never   Smokeless  tobacco: Never  Vaping Use   Vaping status: Never Used  Substance and Sexual Activity   Alcohol use: Not Currently   Drug use: No   Sexual activity: Yes    Partners: Female  Other Topics Concern   Not on file  Social History Narrative   [place] vet, he has known service related agent orange exposure.    Retired   Community Education Officer daily.     Social Drivers of Corporate Investment Banker Strain: Low Risk  (10/02/2023)   Overall Financial Resource Strain (CARDIA)    Difficulty of Paying Living Expenses: Not hard at all  Food Insecurity: No Food Insecurity (02/29/2024)   Hunger Vital Sign    Worried About Running Out of Food in the Last Year: Never true    Ran Out of Food  in the Last Year: Never true  Transportation Needs: No Transportation Needs (02/29/2024)   PRAPARE - Administrator, Civil Service (Medical): No    Lack of Transportation (Non-Medical): No  Physical Activity: Sufficiently Active (10/02/2023)   Exercise Vital Sign    Days of Exercise per Week: 5 days    Minutes of Exercise per Session: 30 min  Stress: No Stress Concern Present (10/02/2023)   [employer] of Occupational Health - Occupational Stress Questionnaire    Feeling of Stress : Not at all  Social Connections: Moderately Integrated (02/29/2024)   Social Connection and Isolation Panel    Frequency of Communication with Friends and Family: Twice a week    Frequency of Social Gatherings with Friends and Family: Once a week    Attends Religious Services: Never    Database Administrator or Organizations: Yes    Attends Banker Meetings: 1 to 4 times per year    Marital Status: Married  Catering Manager Violence: Not At Risk (02/29/2024)   Humiliation, Afraid, Rape, and Kick questionnaire    Fear of Current or Ex-Partner: No    Emotionally Abused: No    Physically Abused: No    Sexually Abused: No      Review of Systems    All other ros negative  Objective:    BP (!) 154/78    Pulse 92   Wt 176 lb 6.4 oz (80 kg)   SpO2 90%   BMI 23.27 kg/m  Nursing note and vital signs reviewed.  Physical Exam     General/constitutional: no distress, pleasant HEENT: Normocephalic, PER, Conj Clear, EOMI, Oropharynx clear Neck supple CV: rrr no mrg Lungs: clear to auscultation, normal respiratory effort Abd: Soft, Nontender Ext: no edema Skin/msk Less swelling/no erythema  Stable 4th toe ulcer/dry      Labs: Lab Results  Component Value Date   WBC 10.7 (H) 02/29/2024   HGB 11.8 (L) 02/29/2024   HCT 34.9 (L) 02/29/2024   MCV 99.4 02/29/2024   PLT 211 02/29/2024   Last metabolic panel Lab Results  Component Value Date   GLUCOSE 224 (H) 02/29/2024   NA 136 02/29/2024   K 4.7 02/29/2024   CL 103 02/29/2024   CO2 18 (L) 02/29/2024   BUN 32 (H) 02/29/2024   CREATININE 1.66 (H) 02/29/2024   GFRNONAA 42 (L) 02/29/2024   CALCIUM  9.4 02/29/2024   PHOS 3.4 02/08/2024   PROT 7.5 02/28/2024   ALBUMIN  3.0 (L) 02/28/2024   LABGLOB 2.9 04/24/2023   BILITOT 0.6 02/28/2024   ALKPHOS 87 02/28/2024   AST 172 (H) 02/28/2024   ALT 101 (H) 02/28/2024   ANIONGAP 15 02/29/2024    Micro:  Serology:  Imaging: Reviewed  10/2 ct right foot without contrast BONES AND JOINTS: Destructive findings in the head of the 1st metatarsal, base of the proximal phalanx great toe, phalanges of the third toe, phalanges of the fourth toe, and in the head of the fourth metatarsal compatible with active osteomyelitis. Oblique fracture through the distal metaphysis of the 4th metatarsal. Small well corticated ossicles below the medial malleolus. Large plantar and achilles calcaneal spurs. Posterior spur angle in the posterior subtalar joint. Mild dorsal midfoot spurring. Mild scallop angle in the superior margins of the 1st digit sesamoids possibly from sesamoiditis or osteomyelitis. No dislocation. The joint spaces are normal.   SOFT TISSUES: Circumferential subcutaneous  edema in the ankle. Dorsal subcutaneous edema in the forefoot.   IMPRESSION: 1.  Active osteomyelitis involving the head of the 1st metatarsal, base of the proximal phalanx of the great toe, phalanges of the third and fourth toes, and head of the fourth metatarsal. 2. Oblique fracture through the distal metaphysis of the 4th metatarsal. 3. Possible sesamoiditis or osteomyelitis of the 1st digit sesamoids. 4. Circumferential ankle and dorsal forefoot subcutaneous edema. 5. Large plantar and Achilles calcaneal spurs, posterior subtalar spur, and mild dorsal midfoot spurring. 6. Small well-corticated ossicles below the medial malleolus.    02/06/24 ct angio bilateral lower ext 1. Severe right worse than left femoropopliteal disease as detailed above. 2. Partially imaged endovascular aortic repair with a bifurcated endovascular prosthesis. The iliac limbs are widely patent and there is no significant inflow disease. 3. Patent 3 vessel runoff to the ankle right. 4. Short segment occlusion of the left anterior tibial artery with patent 2 vessel runoff to the ankle. 5. Mild ectasias a of the right common femoral artery likely at the site of prior endarterectomy. Maximal vessel diameter 1.4 cm.    Assessment & Plan:   Problem List Items Addressed This Visit     PAD (peripheral artery disease) - Primary   Osteomyelitis (HCC)   Other Visit Diagnoses       Diabetic foot infection (HCC)         Congestive heart failure, unspecified HF chronicity, unspecified heart failure type (HCC)             No orders of the defined types were placed in this encounter.    78 yo male hx ipf, dm2, PAD (s/p right cfa endarectomy for occluded SFA, TP trunk), chf, dm2, presence of icd, chronic right foot ulceration here for f/u hospital admission for right foot imaging om  Ct showed probable first mt, base proximal phalanx hallux, and phalanges 3rd and 4th toe om, and head of 4th mt in setting  nonhealing chronic ulcer right foot for several years although there was no opened wound during the 02/05/24 admission  Cellulitis change on admission resolved with abx  Bcx admission was negative  Podiatry did consulted and suspect prior om changes from previous treatment rather than active/acute om although unclear   Patient refused surgical intervention and opted for trial of abx and discharged on dapto/cefepime  Vascular surgery also consulted -- finding as above and he'll need femoral to tibial bypass if unable to heel toe wounds   Eot for iv abx 03/19/24 Opat labs -- I do not have and will inquire  He has no n/v/diarrhea/rash/cough/dyspnea  However he did have new worsening short of breath the last 2 weeks; no increased cough. 10/26 cxr no new pneumonia though He did passed out and required cpr  Finish 6 weeks empiric abx 03/19/24 -stop daptomycin -continue cefepime -start doxycycline  -- 17 days given  F/u podiatry  F/u 6 weeks  Need f/u cardiology as suspect majority of dyspnea is related to cardiopulmonary process  He wants flu shot today which is given   Follow-up: Return in about 6 weeks (around 04/13/2024).      Constance ONEIDA Passer, MD [facility] 03/02/2024, 4:06 PM

## 2024-03-03 ENCOUNTER — Telehealth: Payer: Self-pay

## 2024-03-03 NOTE — Progress Notes (Signed)
 Message sent to [employer] giving order to stop daptomycin and end IV abx on 11/14 and picc can be removed

## 2024-03-03 NOTE — Telephone Encounter (Signed)
 Message sent to [employer] to stop daptomycin and EOT 11/14. Picc can be removed after last dose.  Luke [name] Luke [name], CMA

## 2024-03-03 NOTE — Telephone Encounter (Signed)
-----   Message from [place] sent at 03/02/2024  4:35 PM EDT ----- Hi team could you let hh to stop daptomycin Continue cefepime I added doxycycline  in place of dapto  Everything finish 11/14; picc can be removed then  thanks

## 2024-03-08 ENCOUNTER — Telehealth: Payer: Self-pay

## 2024-03-08 ENCOUNTER — Ambulatory Visit: Attending: Cardiology

## 2024-03-08 DIAGNOSIS — Z95810 Presence of automatic (implantable) cardiac defibrillator: Secondary | ICD-10-CM | POA: Diagnosis not present

## 2024-03-08 DIAGNOSIS — I5042 Chronic combined systolic (congestive) and diastolic (congestive) heart failure: Secondary | ICD-10-CM | POA: Diagnosis not present

## 2024-03-08 NOTE — Telephone Encounter (Signed)
 Remote ICM transmission received.  Attempted call to patient regarding ICM remote transmission and left message to return call with phone number.

## 2024-03-08 NOTE — Progress Notes (Unsigned)
 EPIC Encounter for ICM Monitoring  Patient Name: Luke [name] is a 78 y.o. male Date: 03/08/2024 Primary Care Physican: Cleatus Arlyss RAMAN, MD Primary Cardiologist: Skains/Sabharwal (as needed) Electrophysiologist: Camnitz Bi-V Pacing: >99%          03/24/2023 Weight: 173.6 lbs 04/15/2023 Weight: 174.2 lbs 05/01/2023 Weight: 170 lbs      05/21/2023: Weight: 170 lbs    06/17/2023 Weight: 179 lbs         06/20/2023 Weight: 175.5 lbs      07/30/2023 Weight: 175-178 lbs 10/06/2023 Office Weight: 181 lbs    03/08/2024 Weight: unknown lbs                                 Attempted call to patient and unable to reach.  Left message for return call.  Transmission results reviewed.   Hospitalization 02/06/2024-02/10/2024 and 02/28/2024-02/29/2024   Since 02/02/2024 ICM Remote Transmission: Cor[employer]ue thoracic impedance suggesting possible fluid accumulation starting 02/20/2024 but trending back toward baseline.  Also suggesting possible fluid accumulation from 02/07/2024-02/11/2024 (correlates with hospitalization).   Prescribed:  Torsemide  20 mg take 1 tablet(s) (20 mg total) by mouth daily.  Torsemide  stopped at 02/29/2024 hospital discharge.  Pt is allergic to Lasix .     Labs: 02/29/2024 Creatinine 1.66, BUN 32, Potassium 4.7, Sodium 136, GFR 42  02/28/2024 Creatinine 1.89, BUN 31, Potassium 4.4, Sodium 134, GFR 36  02/26/2024 Creatinine 1.65, BUN 31, Potassium 5.1, Sodium 135  02/23/2024 Creatinine 1.47, BUN 31, Potassium 5.3, Sodium 133 02/16/2024 Creatinine 1.56, BUN 42, Potassium 5.4, Sodium 130  A complete set of results can be found in Results Review.   Recommendations:  Unable to reach.     Follow-up plan: ICM clinic phone appointment on 03/16/2024 to recheck fluid levels.   91 day device clinic remote transmission 04/21/2024.     EP/Cardiology Office Visits:  03/18/2024 with Darryle Currier PA for post hospital visit.   Recall 12/27/2024 with Dr Inocencio.   Recall 06/05/2024 with Dr Jeffrie.      Copy of ICM check sent to Dr. Inocencio.   Remote monitoring is medically necessary for Heart Failure Management.    Daily Thoracic Impedance ICM trend: 12/09/2023 through 03/08/2024.    12-14 Month Thoracic Impedance ICM trend:     Mitzie RAMAN Garner, RN 03/08/2024 11:28 AM

## 2024-03-08 NOTE — Telephone Encounter (Signed)
 Copied from CRM [id number]. Topic: Appointments - Appointment Cancel/Reschedule >> Mar 08, 2024  9:36 AM Delon T wrote: Patient needs to be seen later in the day, unable to make 11:00 on 11/4. Please call wife [phone].

## 2024-03-09 ENCOUNTER — Telehealth: Payer: Self-pay

## 2024-03-09 ENCOUNTER — Inpatient Hospital Stay: Admitting: Family Medicine

## 2024-03-09 DIAGNOSIS — L97519 Non-pressure chronic ulcer of other part of right foot with unspecified severity: Secondary | ICD-10-CM | POA: Diagnosis not present

## 2024-03-09 DIAGNOSIS — L089 Local infection of the skin and subcutaneous tissue, unspecified: Secondary | ICD-10-CM

## 2024-03-09 DIAGNOSIS — E1151 Type 2 diabetes mellitus with diabetic peripheral angiopathy without gangrene: Secondary | ICD-10-CM | POA: Diagnosis not present

## 2024-03-09 DIAGNOSIS — M86671 Other chronic osteomyelitis, right ankle and foot: Secondary | ICD-10-CM | POA: Diagnosis not present

## 2024-03-09 DIAGNOSIS — M869 Osteomyelitis, unspecified: Secondary | ICD-10-CM

## 2024-03-09 DIAGNOSIS — E1169 Type 2 diabetes mellitus with other specified complication: Secondary | ICD-10-CM | POA: Diagnosis not present

## 2024-03-09 DIAGNOSIS — E11621 Type 2 diabetes mellitus with foot ulcer: Secondary | ICD-10-CM | POA: Diagnosis not present

## 2024-03-09 DIAGNOSIS — S92341D Displaced fracture of fourth metatarsal bone, right foot, subsequent encounter for fracture with routine healing: Secondary | ICD-10-CM | POA: Diagnosis not present

## 2024-03-09 MED ORDER — LEVOFLOXACIN 750 MG PO TABS: 750.0000 mg | ORAL_TABLET | Freq: Every day | ORAL | 0 refills | Status: DC

## 2024-03-09 NOTE — Telephone Encounter (Signed)
 Thank you for the update!

## 2024-03-09 NOTE — Telephone Encounter (Signed)
 Thanks

## 2024-03-09 NOTE — Telephone Encounter (Signed)
 Received the following message from Holley Herring, RN with [employer]:   PICC replacement Received: Today Herring Sharlet GORMAN Chandra Lorenda CHRISTELLA, RMA; Joyclyn Plazola D, RN; Speight, Tiffany P, CMA; Celestia Lela CHRISTELLA, CMA RN at home now PICC out 15 cm needing order to DC and will need replacement.  If we dont connect while RN in home, she will send pt to ED.  Just let me know. Thanks.  Per Dr. Overton, okay to remove PICC line and have patient on oral doxycycline  and levofloxacin 750 mg PO daily x 10 days.  Spoke with Pam, she will notify HH to remove the line.  Called patient, his wife answered. She is on the way to the ED with him to have PICC replaced. Notified her that line can be removed. She states HH RN did not secure the line. They will go home and tape the line down to prevent any further migration of the line. Notified her that if patient has any redness, swelling, or pain in the arm or sudden shortness of breath or chest pain that he would need to go to the ED. Patient's wife states [facility] RN will be able to come back out this afternoon to remove the line.   Notified her of plan for oral doxycycline  and levofloxacin. She verbalized understanding and has no further questions.   Amberlee Garvey, BSN, RN

## 2024-03-10 ENCOUNTER — Telehealth: Payer: Self-pay | Admitting: Family Medicine

## 2024-03-10 ENCOUNTER — Ambulatory Visit: Admitting: Vascular Surgery

## 2024-03-10 DIAGNOSIS — I499 Cardiac arrhythmia, unspecified: Secondary | ICD-10-CM | POA: Diagnosis not present

## 2024-03-11 ENCOUNTER — Telehealth: Payer: Self-pay | Admitting: Internal Medicine

## 2024-03-11 NOTE — Telephone Encounter (Signed)
 Get condolence card pleaes

## 2024-03-11 NOTE — Telephone Encounter (Signed)
 A letter has been made and I have signed this on behalf of the nursing staff. I will place this on Dr Reeves desk for his signature then we can mail this out. NFN

## 2024-03-16 ENCOUNTER — Ambulatory Visit

## 2024-03-16 ENCOUNTER — Telehealth: Payer: Self-pay | Admitting: Family Medicine

## 2024-03-16 NOTE — Telephone Encounter (Signed)
 Called and talked with Mrs. Lauver.  Gave my condolences.  I thanked her for taking the call.  She thanked me for calling.  She was asking about what to do about his insulin .  We agreed that it would be reasonable to see about returning the shipment back to the manufacturer.  Please let me know if this cannot be done.  Thanks.

## 2024-03-16 NOTE — Telephone Encounter (Signed)
 Spoke with pt's wife. She states that we should be receiving a shipment from [employer] for the pt's insulin . She wanted to know if there was another pt we could give this medication to once it came in as the pt no longer needs it.

## 2024-03-16 NOTE — Telephone Encounter (Signed)
 Copied from CRM [id number]. Topic: General - Other >> Mar 16, 2024  1:58 PM Aisha D wrote: Reason for CRM: Pt's wife Avelina is calling to speak with a nurse or cma in regards to an insulin  delivery and has some questions. Avelina would like a callback. CB [phone].

## 2024-03-17 NOTE — Telephone Encounter (Signed)
 We can put this in our PAR (Patient Assistance Returns)  to share with another patient that can use them.  Please let me know when the medications arrive.

## 2024-03-18 ENCOUNTER — Ambulatory Visit: Admitting: Cardiology

## 2024-03-19 NOTE — Telephone Encounter (Signed)
 Medication arrived placed in fridge. I have packing sheet to enter in when we start log

## 2024-03-22 ENCOUNTER — Inpatient Hospital Stay: Admitting: Family Medicine

## 2024-04-05 ENCOUNTER — Ambulatory Visit: Admitting: Podiatry

## 2024-04-05 NOTE — Telephone Encounter (Signed)
 Copied from CRM [id number]. Topic: General - Deceased Patient >> 04/07/2024  8:45 AM Rea ORN wrote: Name of caller: Odetta Ray  Date of death: 2024-04-07   Name of funeral home: NA  Phone number of funeral home: NA  Provider that needs to sign form: NA  Timeline for signing: NA  Pt spouse Patty called to advise clinic that the pt passed this morning at 4 am. She did not give funeral home name or timeline for signing death certificate. All future appts for pt were canceled.

## 2024-04-05 NOTE — Telephone Encounter (Signed)
 Called and LMOVM for pt's wife to give my condolences.  I was always glad to see this kind gentleman in clinic.  He will be missed.

## 2024-04-05 DEATH — deceased

## 2024-04-06 ENCOUNTER — Ambulatory Visit: Admitting: Internal Medicine

## 2024-04-07 ENCOUNTER — Ambulatory Visit: Admitting: Internal Medicine

## 2024-04-21 ENCOUNTER — Encounter

## 2024-04-27 ENCOUNTER — Ambulatory Visit

## 2024-05-12 ENCOUNTER — Encounter (HOSPITAL_COMMUNITY)

## 2024-05-12 ENCOUNTER — Ambulatory Visit

## 2024-06-03 ENCOUNTER — Ambulatory Visit: Admitting: Allergy & Immunology

## 2024-07-21 ENCOUNTER — Encounter

## 2024-10-20 ENCOUNTER — Encounter

## 2025-01-19 ENCOUNTER — Encounter

## 2025-04-20 ENCOUNTER — Encounter

## 2025-07-20 ENCOUNTER — Encounter
